# Patient Record
Sex: Male | Born: 1960
Health system: Southern US, Community
[De-identification: ages and names within clinical notes are randomized; demographics above are authoritative.]

## PROBLEM LIST (undated history)

## (undated) DIAGNOSIS — G4733 Obstructive sleep apnea (adult) (pediatric): Secondary | ICD-10-CM

## (undated) DIAGNOSIS — Z952 Presence of prosthetic heart valve: Secondary | ICD-10-CM

## (undated) DIAGNOSIS — Z954 Presence of other heart-valve replacement: Secondary | ICD-10-CM

## (undated) DIAGNOSIS — I5033 Acute on chronic diastolic (congestive) heart failure: Secondary | ICD-10-CM

## (undated) DIAGNOSIS — I4891 Unspecified atrial fibrillation: Secondary | ICD-10-CM

## (undated) DIAGNOSIS — A0472 Enterocolitis due to Clostridium difficile, not specified as recurrent: Secondary | ICD-10-CM

## (undated) DIAGNOSIS — I872 Venous insufficiency (chronic) (peripheral): Secondary | ICD-10-CM

## (undated) DIAGNOSIS — G459 Transient cerebral ischemic attack, unspecified: Secondary | ICD-10-CM

## (undated) DIAGNOSIS — I10 Essential (primary) hypertension: Secondary | ICD-10-CM

## (undated) DIAGNOSIS — I639 Cerebral infarction, unspecified: Secondary | ICD-10-CM

## (undated) DIAGNOSIS — E291 Testicular hypofunction: Secondary | ICD-10-CM

## (undated) DIAGNOSIS — G9340 Encephalopathy, unspecified: Secondary | ICD-10-CM

## (undated) DIAGNOSIS — D649 Anemia, unspecified: Secondary | ICD-10-CM

## (undated) DIAGNOSIS — E119 Type 2 diabetes mellitus without complications: Secondary | ICD-10-CM

## (undated) DIAGNOSIS — I5032 Chronic diastolic (congestive) heart failure: Secondary | ICD-10-CM

## (undated) DIAGNOSIS — I8310 Varicose veins of unspecified lower extremity with inflammation: Secondary | ICD-10-CM

## (undated) DIAGNOSIS — Z86010 Personal history of colonic polyps: Secondary | ICD-10-CM

## (undated) DIAGNOSIS — I6350 Cerebral infarction due to unspecified occlusion or stenosis of unspecified cerebral artery: Secondary | ICD-10-CM

## (undated) DIAGNOSIS — K219 Gastro-esophageal reflux disease without esophagitis: Secondary | ICD-10-CM

## (undated) DIAGNOSIS — J80 Acute respiratory distress syndrome: Secondary | ICD-10-CM

## (undated) DIAGNOSIS — I8390 Asymptomatic varicose veins of unspecified lower extremity: Secondary | ICD-10-CM

## (undated) DIAGNOSIS — L0390 Cellulitis, unspecified: Secondary | ICD-10-CM

## (undated) DIAGNOSIS — E785 Hyperlipidemia, unspecified: Secondary | ICD-10-CM

## (undated) DIAGNOSIS — K449 Diaphragmatic hernia without obstruction or gangrene: Secondary | ICD-10-CM

## (undated) DIAGNOSIS — I739 Peripheral vascular disease, unspecified: Secondary | ICD-10-CM

## (undated) DIAGNOSIS — K5790 Diverticulosis of intestine, part unspecified, without perforation or abscess without bleeding: Secondary | ICD-10-CM

## (undated) DIAGNOSIS — I2510 Atherosclerotic heart disease of native coronary artery without angina pectoris: Secondary | ICD-10-CM

## (undated) DIAGNOSIS — N529 Male erectile dysfunction, unspecified: Secondary | ICD-10-CM

## (undated) DIAGNOSIS — I340 Nonrheumatic mitral (valve) insufficiency: Secondary | ICD-10-CM

## (undated) DIAGNOSIS — M545 Low back pain: Secondary | ICD-10-CM

## (undated) DIAGNOSIS — G510 Bell's palsy: Secondary | ICD-10-CM

## (undated) DIAGNOSIS — I87309 Chronic venous hypertension (idiopathic) without complications of unspecified lower extremity: Secondary | ICD-10-CM

## (undated) DIAGNOSIS — G8929 Other chronic pain: Secondary | ICD-10-CM

## (undated) DIAGNOSIS — R7302 Impaired glucose tolerance (oral): Secondary | ICD-10-CM

## (undated) DIAGNOSIS — D126 Benign neoplasm of colon, unspecified: Secondary | ICD-10-CM

## (undated) DIAGNOSIS — C6290 Malignant neoplasm of unspecified testis, unspecified whether descended or undescended: Secondary | ICD-10-CM

## (undated) DIAGNOSIS — I219 Acute myocardial infarction, unspecified: Secondary | ICD-10-CM

## (undated) DIAGNOSIS — F419 Anxiety disorder, unspecified: Secondary | ICD-10-CM

## (undated) DIAGNOSIS — I7101 Dissection of thoracic aorta: Secondary | ICD-10-CM

## (undated) DIAGNOSIS — K317 Polyp of stomach and duodenum: Secondary | ICD-10-CM

## (undated) DIAGNOSIS — M1990 Unspecified osteoarthritis, unspecified site: Secondary | ICD-10-CM

## (undated) HISTORY — DX: Presence of prosthetic heart valve: Z95.2

## (undated) HISTORY — PX: ORCHIECTOMY: SHX2116

## (undated) HISTORY — DX: Cellulitis, unspecified: L03.90

## (undated) HISTORY — DX: Type 2 diabetes mellitus without complications: E11.9

## (undated) HISTORY — DX: Cerebral infarction due to unspecified occlusion or stenosis of unspecified cerebral artery: I63.50

## (undated) HISTORY — DX: Acute respiratory distress syndrome: J80

## (undated) HISTORY — DX: Nonrheumatic mitral (valve) insufficiency: I34.0

## (undated) HISTORY — DX: Hyperlipidemia, unspecified: E78.5

## (undated) HISTORY — DX: Morbid (severe) obesity due to excess calories: E66.01

## (undated) HISTORY — DX: Atherosclerotic heart disease of native coronary artery without angina pectoris: I25.10

## (undated) HISTORY — DX: Anemia, unspecified: D64.9

## (undated) HISTORY — PX: OTOPLASTY: SUR998

## (undated) HISTORY — PX: CARDIAC VALVE REPLACEMENT: SHX585

## (undated) HISTORY — DX: Benign neoplasm of colon, unspecified: D12.6

## (undated) HISTORY — DX: Chronic diastolic (congestive) heart failure: I50.32

## (undated) HISTORY — DX: Acute myocardial infarction, unspecified: I21.9

## (undated) HISTORY — DX: Presence of other heart-valve replacement: Z95.4

## (undated) HISTORY — DX: Personal history of colonic polyps: Z86.010

## (undated) HISTORY — DX: Essential (primary) hypertension: I10

## (undated) HISTORY — DX: Unspecified atrial fibrillation: I48.91

## (undated) HISTORY — DX: Impaired glucose tolerance (oral): R73.02

## (undated) HISTORY — DX: Varicose veins of unspecified lower extremity with inflammation: I83.10

## (undated) HISTORY — PX: OTHER SURGICAL HISTORY: SHX169

## (undated) HISTORY — DX: Chronic venous hypertension (idiopathic) without complications of unspecified lower extremity: I87.309

## (undated) HISTORY — DX: Testicular hypofunction: E29.1

## (undated) HISTORY — DX: Acute on chronic diastolic (congestive) heart failure: I50.33

## (undated) HISTORY — DX: Unspecified osteoarthritis, unspecified site: M19.90

## (undated) HISTORY — DX: Dissection of thoracic aorta: I71.01

## (undated) HISTORY — DX: Venous insufficiency (chronic) (peripheral): I87.2

## (undated) HISTORY — DX: Malignant neoplasm of unspecified testis, unspecified whether descended or undescended: C62.90

## (undated) HISTORY — DX: Male erectile dysfunction, unspecified: N52.9

## (undated) HISTORY — DX: Diverticulosis of intestine, part unspecified, without perforation or abscess without bleeding: K57.90

## (undated) HISTORY — DX: Cerebral infarction, unspecified: I63.9

## (undated) HISTORY — DX: Asymptomatic varicose veins of unspecified lower extremity: I83.90

## (undated) HISTORY — DX: Obstructive sleep apnea (adult) (pediatric): G47.33

## (undated) HISTORY — DX: Transient cerebral ischemic attack, unspecified: G45.9

## (undated) HISTORY — DX: Low back pain: M54.5

## (undated) HISTORY — DX: Peripheral vascular disease, unspecified: I73.9

## (undated) HISTORY — DX: Polyp of stomach and duodenum: K31.7

## (undated) HISTORY — DX: Diaphragmatic hernia without obstruction or gangrene: K44.9

## (undated) HISTORY — DX: Enterocolitis due to Clostridium difficile, not specified as recurrent: A04.72

## (undated) HISTORY — DX: Encephalopathy, unspecified: G93.40

## (undated) HISTORY — PX: CORONARY ARTERY BYPASS GRAFT: SHX141

## (undated) HISTORY — DX: Bell's palsy: G51.0

## (undated) HISTORY — DX: Other chronic pain: G89.29

---
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
## 1986-09-01 HISTORY — PX: BENTALL PROCEDURE: SHX5058

## 2005-02-03 DIAGNOSIS — I872 Venous insufficiency (chronic) (peripheral): Secondary | ICD-10-CM

## 2005-02-03 DIAGNOSIS — I8310 Varicose veins of unspecified lower extremity with inflammation: Secondary | ICD-10-CM

## 2005-02-03 HISTORY — DX: Varicose veins of unspecified lower extremity with inflammation: I83.10

## 2005-02-03 HISTORY — DX: Venous insufficiency (chronic) (peripheral): I87.2

## 2007-02-28 ENCOUNTER — Emergency Department (HOSPITAL_COMMUNITY): Admission: EM | Admit: 2007-02-28 | Discharge: 2007-02-28 | Payer: Self-pay | Admitting: Orthopedic Surgery

## 2008-07-14 ENCOUNTER — Encounter: Admission: RE | Admit: 2008-07-14 | Discharge: 2008-07-14 | Payer: Self-pay | Admitting: Cardiology

## 2008-07-14 DIAGNOSIS — I7101 Dissection of ascending aorta: Secondary | ICD-10-CM

## 2008-07-14 HISTORY — DX: Dissection of ascending aorta: I71.010

## 2008-07-14 HISTORY — DX: Dissection of thoracic aorta: I71.01

## 2009-07-05 ENCOUNTER — Emergency Department (HOSPITAL_COMMUNITY): Admission: EM | Admit: 2009-07-05 | Discharge: 2009-07-05 | Payer: Self-pay | Admitting: Emergency Medicine

## 2009-09-01 HISTORY — PX: CHOLECYSTECTOMY: SHX55

## 2009-10-01 ENCOUNTER — Emergency Department (HOSPITAL_COMMUNITY): Admission: EM | Admit: 2009-10-01 | Discharge: 2009-10-01 | Payer: Self-pay | Admitting: Emergency Medicine

## 2009-10-19 ENCOUNTER — Inpatient Hospital Stay (HOSPITAL_COMMUNITY): Admission: EM | Admit: 2009-10-19 | Discharge: 2009-10-22 | Payer: Self-pay | Admitting: Emergency Medicine

## 2009-10-19 ENCOUNTER — Ambulatory Visit: Payer: Self-pay | Admitting: Cardiology

## 2009-10-22 ENCOUNTER — Encounter: Payer: Self-pay | Admitting: Cardiology

## 2009-10-27 ENCOUNTER — Emergency Department (HOSPITAL_COMMUNITY): Admission: EM | Admit: 2009-10-27 | Discharge: 2009-10-27 | Payer: Self-pay | Admitting: Family Medicine

## 2009-10-28 ENCOUNTER — Ambulatory Visit: Payer: Self-pay | Admitting: Internal Medicine

## 2009-10-28 ENCOUNTER — Inpatient Hospital Stay (HOSPITAL_COMMUNITY): Admission: EM | Admit: 2009-10-28 | Discharge: 2009-10-31 | Payer: Self-pay | Admitting: Emergency Medicine

## 2009-10-29 ENCOUNTER — Encounter: Payer: Self-pay | Admitting: Cardiology

## 2009-10-30 ENCOUNTER — Encounter: Payer: Self-pay | Admitting: Cardiology

## 2009-10-31 ENCOUNTER — Encounter: Payer: Self-pay | Admitting: Cardiology

## 2009-11-01 ENCOUNTER — Ambulatory Visit: Payer: Self-pay | Admitting: Internal Medicine

## 2009-11-01 DIAGNOSIS — I8390 Asymptomatic varicose veins of unspecified lower extremity: Secondary | ICD-10-CM

## 2009-11-01 DIAGNOSIS — M109 Gout, unspecified: Secondary | ICD-10-CM

## 2009-11-01 DIAGNOSIS — I4891 Unspecified atrial fibrillation: Secondary | ICD-10-CM | POA: Insufficient documentation

## 2009-11-01 DIAGNOSIS — R609 Edema, unspecified: Secondary | ICD-10-CM

## 2009-11-01 DIAGNOSIS — Z954 Presence of other heart-valve replacement: Secondary | ICD-10-CM | POA: Insufficient documentation

## 2009-11-01 DIAGNOSIS — K648 Other hemorrhoids: Secondary | ICD-10-CM | POA: Insufficient documentation

## 2009-11-01 DIAGNOSIS — E119 Type 2 diabetes mellitus without complications: Secondary | ICD-10-CM

## 2009-11-01 DIAGNOSIS — D509 Iron deficiency anemia, unspecified: Secondary | ICD-10-CM

## 2009-11-01 DIAGNOSIS — I252 Old myocardial infarction: Secondary | ICD-10-CM

## 2009-11-01 HISTORY — DX: Type 2 diabetes mellitus without complications: E11.9

## 2009-11-02 ENCOUNTER — Encounter (INDEPENDENT_AMBULATORY_CARE_PROVIDER_SITE_OTHER): Payer: Self-pay | Admitting: Internal Medicine

## 2009-11-04 ENCOUNTER — Telehealth (INDEPENDENT_AMBULATORY_CARE_PROVIDER_SITE_OTHER): Payer: Self-pay | Admitting: Internal Medicine

## 2009-11-05 LAB — CONVERTED CEMR LAB: Uric Acid, Serum: 12.1 mg/dL — ABNORMAL HIGH (ref 4.0–7.8)

## 2009-11-09 ENCOUNTER — Ambulatory Visit: Payer: Self-pay | Admitting: Internal Medicine

## 2009-11-11 ENCOUNTER — Inpatient Hospital Stay (HOSPITAL_COMMUNITY): Admission: EM | Admit: 2009-11-11 | Discharge: 2009-11-17 | Payer: Self-pay | Admitting: Emergency Medicine

## 2009-11-11 ENCOUNTER — Encounter (INDEPENDENT_AMBULATORY_CARE_PROVIDER_SITE_OTHER): Payer: Self-pay | Admitting: Dermatology

## 2009-11-11 ENCOUNTER — Encounter: Payer: Self-pay | Admitting: Cardiology

## 2009-11-11 ENCOUNTER — Ambulatory Visit: Payer: Self-pay | Admitting: Internal Medicine

## 2009-11-13 ENCOUNTER — Encounter (INDEPENDENT_AMBULATORY_CARE_PROVIDER_SITE_OTHER): Payer: Self-pay | Admitting: Internal Medicine

## 2009-11-14 ENCOUNTER — Encounter: Payer: Self-pay | Admitting: Internal Medicine

## 2009-11-14 LAB — CONVERTED CEMR LAB
INR: 2.11 — ABNORMAL HIGH (ref <1.50)
Prothrombin Time: 23.5 s — ABNORMAL HIGH (ref 11.6–15.2)

## 2009-11-17 ENCOUNTER — Encounter: Payer: Self-pay | Admitting: Internal Medicine

## 2009-11-20 ENCOUNTER — Ambulatory Visit: Payer: Self-pay | Admitting: Internal Medicine

## 2009-11-24 LAB — CONVERTED CEMR LAB
INR: 1.38 (ref <1.50)
Prothrombin Time: 16.8 s — ABNORMAL HIGH (ref 11.6–15.2)

## 2009-11-26 ENCOUNTER — Ambulatory Visit: Payer: Self-pay | Admitting: Internal Medicine

## 2009-11-27 LAB — CONVERTED CEMR LAB: INR: 1.59 — ABNORMAL HIGH (ref <1.50)

## 2009-11-29 ENCOUNTER — Ambulatory Visit: Payer: Self-pay | Admitting: Internal Medicine

## 2009-11-30 ENCOUNTER — Ambulatory Visit: Payer: Self-pay | Admitting: Cardiology

## 2009-11-30 LAB — CONVERTED CEMR LAB: Chloride: 96 meq/L (ref 96–112)

## 2009-12-04 ENCOUNTER — Ambulatory Visit: Payer: Self-pay | Admitting: Vascular Surgery

## 2009-12-04 ENCOUNTER — Encounter (INDEPENDENT_AMBULATORY_CARE_PROVIDER_SITE_OTHER): Payer: Self-pay | Admitting: Internal Medicine

## 2009-12-04 ENCOUNTER — Ambulatory Visit: Admission: RE | Admit: 2009-12-04 | Discharge: 2009-12-04 | Payer: Self-pay | Admitting: Internal Medicine

## 2009-12-05 ENCOUNTER — Telehealth (INDEPENDENT_AMBULATORY_CARE_PROVIDER_SITE_OTHER): Payer: Self-pay | Admitting: Internal Medicine

## 2009-12-06 ENCOUNTER — Ambulatory Visit: Payer: Self-pay | Admitting: Internal Medicine

## 2009-12-07 ENCOUNTER — Telehealth (INDEPENDENT_AMBULATORY_CARE_PROVIDER_SITE_OTHER): Payer: Self-pay | Admitting: Internal Medicine

## 2009-12-07 LAB — CONVERTED CEMR LAB
INR: 3.38 — ABNORMAL HIGH (ref <1.50)
Prothrombin Time: 33.9 s — ABNORMAL HIGH (ref 11.6–15.2)

## 2009-12-10 ENCOUNTER — Telehealth (INDEPENDENT_AMBULATORY_CARE_PROVIDER_SITE_OTHER): Payer: Self-pay | Admitting: Internal Medicine

## 2009-12-14 ENCOUNTER — Telehealth (INDEPENDENT_AMBULATORY_CARE_PROVIDER_SITE_OTHER): Payer: Self-pay | Admitting: Internal Medicine

## 2009-12-14 ENCOUNTER — Ambulatory Visit: Payer: Self-pay | Admitting: Internal Medicine

## 2009-12-14 DIAGNOSIS — I10 Essential (primary) hypertension: Secondary | ICD-10-CM

## 2009-12-15 ENCOUNTER — Telehealth (INDEPENDENT_AMBULATORY_CARE_PROVIDER_SITE_OTHER): Payer: Self-pay | Admitting: Internal Medicine

## 2009-12-15 LAB — CONVERTED CEMR LAB
INR: 6.83 (ref <1.50)
Prothrombin Time: 58.8 s — ABNORMAL HIGH (ref 11.6–15.2)

## 2009-12-17 ENCOUNTER — Ambulatory Visit: Payer: Self-pay | Admitting: Internal Medicine

## 2009-12-17 ENCOUNTER — Encounter: Payer: Self-pay | Admitting: Physician Assistant

## 2009-12-18 LAB — CONVERTED CEMR LAB: Prothrombin Time: 36.7 s — ABNORMAL HIGH (ref 11.6–15.2)

## 2009-12-19 ENCOUNTER — Telehealth (INDEPENDENT_AMBULATORY_CARE_PROVIDER_SITE_OTHER): Payer: Self-pay | Admitting: Internal Medicine

## 2009-12-24 ENCOUNTER — Ambulatory Visit: Payer: Self-pay | Admitting: Internal Medicine

## 2009-12-25 ENCOUNTER — Encounter (INDEPENDENT_AMBULATORY_CARE_PROVIDER_SITE_OTHER): Payer: Self-pay | Admitting: Internal Medicine

## 2009-12-26 LAB — CONVERTED CEMR LAB: INR: 1.69 — ABNORMAL HIGH (ref <1.50)

## 2010-01-02 ENCOUNTER — Ambulatory Visit: Payer: Self-pay | Admitting: Internal Medicine

## 2010-01-09 LAB — CONVERTED CEMR LAB: INR: 3.11 — ABNORMAL HIGH (ref <1.50)

## 2010-01-15 ENCOUNTER — Ambulatory Visit: Payer: Self-pay | Admitting: Internal Medicine

## 2010-01-15 DIAGNOSIS — L97909 Non-pressure chronic ulcer of unspecified part of unspecified lower leg with unspecified severity: Secondary | ICD-10-CM

## 2010-01-15 DIAGNOSIS — I87309 Chronic venous hypertension (idiopathic) without complications of unspecified lower extremity: Secondary | ICD-10-CM | POA: Insufficient documentation

## 2010-01-15 DIAGNOSIS — E785 Hyperlipidemia, unspecified: Secondary | ICD-10-CM | POA: Insufficient documentation

## 2010-01-15 DIAGNOSIS — I87319 Chronic venous hypertension (idiopathic) with ulcer of unspecified lower extremity: Secondary | ICD-10-CM

## 2010-01-16 ENCOUNTER — Encounter (INDEPENDENT_AMBULATORY_CARE_PROVIDER_SITE_OTHER): Payer: Self-pay | Admitting: Internal Medicine

## 2010-01-22 ENCOUNTER — Telehealth (INDEPENDENT_AMBULATORY_CARE_PROVIDER_SITE_OTHER): Payer: Self-pay | Admitting: Internal Medicine

## 2010-01-25 ENCOUNTER — Ambulatory Visit: Payer: Self-pay | Admitting: Internal Medicine

## 2010-01-25 DIAGNOSIS — L02419 Cutaneous abscess of limb, unspecified: Secondary | ICD-10-CM | POA: Insufficient documentation

## 2010-01-25 DIAGNOSIS — L03119 Cellulitis of unspecified part of limb: Secondary | ICD-10-CM

## 2010-01-25 LAB — CONVERTED CEMR LAB
BUN: 18 mg/dL (ref 6–23)
CO2: 28 meq/L (ref 19–32)
Cholesterol: 140 mg/dL (ref 0–200)
Glucose, Bld: 75 mg/dL (ref 70–99)
HDL: 28 mg/dL — ABNORMAL LOW (ref >39)
LDL Cholesterol: 60 mg/dL (ref 0–99)
Potassium: 4.2 meq/L (ref 3.5–5.3)
Prothrombin Time: 34.6 s — ABNORMAL HIGH (ref 11.6–15.2)
Sodium: 137 meq/L (ref 135–145)
VLDL: 52 mg/dL — ABNORMAL HIGH (ref 0–40)

## 2010-02-06 ENCOUNTER — Ambulatory Visit: Payer: Self-pay | Admitting: Vascular Surgery

## 2010-02-06 ENCOUNTER — Encounter (INDEPENDENT_AMBULATORY_CARE_PROVIDER_SITE_OTHER): Payer: Self-pay | Admitting: Internal Medicine

## 2010-02-07 ENCOUNTER — Ambulatory Visit: Payer: Self-pay | Admitting: Internal Medicine

## 2010-02-07 ENCOUNTER — Telehealth (INDEPENDENT_AMBULATORY_CARE_PROVIDER_SITE_OTHER): Payer: Self-pay | Admitting: Internal Medicine

## 2010-02-13 ENCOUNTER — Encounter: Admission: RE | Admit: 2010-02-13 | Discharge: 2010-02-13 | Payer: Self-pay | Admitting: Internal Medicine

## 2010-02-13 ENCOUNTER — Telehealth (INDEPENDENT_AMBULATORY_CARE_PROVIDER_SITE_OTHER): Payer: Self-pay | Admitting: *Deleted

## 2010-02-21 ENCOUNTER — Encounter (INDEPENDENT_AMBULATORY_CARE_PROVIDER_SITE_OTHER): Payer: Self-pay | Admitting: Internal Medicine

## 2010-02-21 LAB — CONVERTED CEMR LAB: INR: 3.17 — ABNORMAL HIGH (ref <1.50)

## 2010-02-26 ENCOUNTER — Encounter (HOSPITAL_BASED_OUTPATIENT_CLINIC_OR_DEPARTMENT_OTHER): Admission: RE | Admit: 2010-02-26 | Discharge: 2010-05-27 | Payer: Self-pay | Admitting: General Surgery

## 2010-02-27 ENCOUNTER — Telehealth (INDEPENDENT_AMBULATORY_CARE_PROVIDER_SITE_OTHER): Payer: Self-pay | Admitting: Internal Medicine

## 2010-03-06 ENCOUNTER — Ambulatory Visit: Payer: Self-pay | Admitting: Vascular Surgery

## 2010-03-06 HISTORY — PX: LASER ABLATION: SHX1947

## 2010-03-13 ENCOUNTER — Ambulatory Visit: Payer: Self-pay | Admitting: Vascular Surgery

## 2010-03-15 ENCOUNTER — Ambulatory Visit: Payer: Self-pay | Admitting: Internal Medicine

## 2010-04-02 ENCOUNTER — Ambulatory Visit: Payer: Self-pay | Admitting: Internal Medicine

## 2010-04-02 ENCOUNTER — Emergency Department (HOSPITAL_COMMUNITY): Admission: EM | Admit: 2010-04-02 | Discharge: 2010-04-02 | Payer: Self-pay | Admitting: Family Medicine

## 2010-04-02 ENCOUNTER — Telehealth (INDEPENDENT_AMBULATORY_CARE_PROVIDER_SITE_OTHER): Payer: Self-pay | Admitting: Internal Medicine

## 2010-04-02 DIAGNOSIS — T169XXA Foreign body in ear, unspecified ear, initial encounter: Secondary | ICD-10-CM | POA: Insufficient documentation

## 2010-04-15 ENCOUNTER — Ambulatory Visit: Payer: Self-pay | Admitting: Internal Medicine

## 2010-04-18 LAB — CONVERTED CEMR LAB
INR: 3.16 — ABNORMAL HIGH (ref <1.50)
Prothrombin Time: 32.5 s — ABNORMAL HIGH (ref 11.6–15.2)

## 2010-04-29 ENCOUNTER — Ambulatory Visit: Payer: Self-pay | Admitting: Internal Medicine

## 2010-04-29 DIAGNOSIS — M479 Spondylosis, unspecified: Secondary | ICD-10-CM | POA: Insufficient documentation

## 2010-04-29 DIAGNOSIS — L97909 Non-pressure chronic ulcer of unspecified part of unspecified lower leg with unspecified severity: Secondary | ICD-10-CM | POA: Insufficient documentation

## 2010-04-29 DIAGNOSIS — G4730 Sleep apnea, unspecified: Secondary | ICD-10-CM | POA: Insufficient documentation

## 2010-04-29 LAB — CONVERTED CEMR LAB: Blood Glucose, Fingerstick: 149

## 2010-05-15 ENCOUNTER — Ambulatory Visit: Payer: Self-pay | Admitting: Internal Medicine

## 2010-05-16 ENCOUNTER — Encounter (HOSPITAL_COMMUNITY)
Admission: RE | Admit: 2010-05-16 | Discharge: 2010-05-31 | Payer: Self-pay | Source: Home / Self Care | Admitting: Cardiology

## 2010-05-17 ENCOUNTER — Telehealth (INDEPENDENT_AMBULATORY_CARE_PROVIDER_SITE_OTHER): Payer: Self-pay | Admitting: Internal Medicine

## 2010-05-17 ENCOUNTER — Encounter: Payer: Self-pay | Admitting: Cardiology

## 2010-05-20 ENCOUNTER — Telehealth (INDEPENDENT_AMBULATORY_CARE_PROVIDER_SITE_OTHER): Payer: Self-pay | Admitting: Internal Medicine

## 2010-05-20 ENCOUNTER — Ambulatory Visit (HOSPITAL_COMMUNITY): Admission: RE | Admit: 2010-05-20 | Discharge: 2010-05-20 | Payer: Self-pay | Admitting: Internal Medicine

## 2010-05-20 ENCOUNTER — Encounter: Payer: Self-pay | Admitting: Cardiology

## 2010-05-24 ENCOUNTER — Ambulatory Visit: Payer: Self-pay | Admitting: Internal Medicine

## 2010-05-30 LAB — CONVERTED CEMR LAB: INR: 1.93 — ABNORMAL HIGH (ref <1.50)

## 2010-06-01 ENCOUNTER — Encounter (HOSPITAL_COMMUNITY)
Admission: RE | Admit: 2010-06-01 | Discharge: 2010-08-09 | Payer: Self-pay | Source: Home / Self Care | Attending: Cardiology | Admitting: Cardiology

## 2010-06-04 ENCOUNTER — Ambulatory Visit: Payer: Self-pay | Admitting: Internal Medicine

## 2010-06-07 ENCOUNTER — Encounter: Payer: Self-pay | Admitting: Cardiology

## 2010-06-11 LAB — CONVERTED CEMR LAB: Prothrombin Time: 33.3 s — ABNORMAL HIGH (ref 11.6–15.2)

## 2010-06-12 ENCOUNTER — Encounter (INDEPENDENT_AMBULATORY_CARE_PROVIDER_SITE_OTHER): Payer: Self-pay | Admitting: Internal Medicine

## 2010-06-15 ENCOUNTER — Emergency Department (HOSPITAL_COMMUNITY): Admission: EM | Admit: 2010-06-15 | Discharge: 2010-06-15 | Payer: Self-pay | Admitting: Family Medicine

## 2010-06-21 ENCOUNTER — Telehealth (INDEPENDENT_AMBULATORY_CARE_PROVIDER_SITE_OTHER): Payer: Self-pay | Admitting: Internal Medicine

## 2010-06-25 ENCOUNTER — Ambulatory Visit: Payer: Self-pay | Admitting: Vascular Surgery

## 2010-06-26 ENCOUNTER — Encounter (INDEPENDENT_AMBULATORY_CARE_PROVIDER_SITE_OTHER): Payer: Self-pay | Admitting: Internal Medicine

## 2010-06-27 ENCOUNTER — Ambulatory Visit: Payer: Self-pay | Admitting: Internal Medicine

## 2010-07-04 ENCOUNTER — Encounter (INDEPENDENT_AMBULATORY_CARE_PROVIDER_SITE_OTHER): Payer: Self-pay | Admitting: Internal Medicine

## 2010-07-16 ENCOUNTER — Ambulatory Visit: Payer: Self-pay | Admitting: Internal Medicine

## 2010-07-18 ENCOUNTER — Encounter: Payer: Self-pay | Admitting: Cardiology

## 2010-07-19 LAB — CONVERTED CEMR LAB: Prothrombin Time: 35.3 s — ABNORMAL HIGH (ref 11.6–15.2)

## 2010-07-23 ENCOUNTER — Ambulatory Visit: Payer: Self-pay | Admitting: Internal Medicine

## 2010-07-23 DIAGNOSIS — Z8547 Personal history of malignant neoplasm of testis: Secondary | ICD-10-CM | POA: Insufficient documentation

## 2010-07-23 LAB — CONVERTED CEMR LAB
Basophils Relative: 1 % (ref 0–1)
Eosinophils Absolute: 0.1 10*3/uL (ref 0.0–0.7)
Eosinophils Relative: 2 % (ref 0–5)
HCT: 35.9 % — ABNORMAL LOW (ref 39.0–52.0)
Hemoglobin: 10.8 g/dL — ABNORMAL LOW (ref 13.0–17.0)
Lymphs Abs: 1.5 10*3/uL (ref 0.7–4.0)
MCHC: 30.1 g/dL (ref 30.0–36.0)
MCV: 83.5 fL (ref 78.0–100.0)
Monocytes Absolute: 0.8 10*3/uL (ref 0.1–1.0)
Monocytes Relative: 9 % (ref 3–12)
RBC: 4.3 M/uL (ref 4.22–5.81)
WBC: 8.2 10*3/uL (ref 4.0–10.5)

## 2010-07-30 ENCOUNTER — Ambulatory Visit: Payer: Self-pay | Admitting: Internal Medicine

## 2010-07-30 LAB — CONVERTED CEMR LAB
INR: 2.74 — ABNORMAL HIGH (ref <1.50)
Prothrombin Time: 29.1 s — ABNORMAL HIGH (ref 11.6–15.2)

## 2010-08-02 ENCOUNTER — Telehealth (INDEPENDENT_AMBULATORY_CARE_PROVIDER_SITE_OTHER): Payer: Self-pay | Admitting: Internal Medicine

## 2010-08-15 ENCOUNTER — Telehealth (INDEPENDENT_AMBULATORY_CARE_PROVIDER_SITE_OTHER): Payer: Self-pay | Admitting: Internal Medicine

## 2010-08-19 ENCOUNTER — Encounter
Admission: RE | Admit: 2010-08-19 | Discharge: 2010-10-01 | Payer: Self-pay | Source: Home / Self Care | Attending: Internal Medicine | Admitting: Internal Medicine

## 2010-08-21 ENCOUNTER — Telehealth (INDEPENDENT_AMBULATORY_CARE_PROVIDER_SITE_OTHER): Payer: Self-pay | Admitting: Internal Medicine

## 2010-08-21 ENCOUNTER — Encounter (INDEPENDENT_AMBULATORY_CARE_PROVIDER_SITE_OTHER): Payer: Self-pay | Admitting: Internal Medicine

## 2010-08-25 ENCOUNTER — Telehealth (INDEPENDENT_AMBULATORY_CARE_PROVIDER_SITE_OTHER): Payer: Self-pay | Admitting: Internal Medicine

## 2010-08-27 ENCOUNTER — Telehealth (INDEPENDENT_AMBULATORY_CARE_PROVIDER_SITE_OTHER): Payer: Self-pay | Admitting: Internal Medicine

## 2010-09-05 ENCOUNTER — Encounter (INDEPENDENT_AMBULATORY_CARE_PROVIDER_SITE_OTHER): Payer: Self-pay | Admitting: Internal Medicine

## 2010-09-10 ENCOUNTER — Encounter (INDEPENDENT_AMBULATORY_CARE_PROVIDER_SITE_OTHER): Payer: Self-pay | Admitting: Internal Medicine

## 2010-09-12 ENCOUNTER — Telehealth (INDEPENDENT_AMBULATORY_CARE_PROVIDER_SITE_OTHER): Payer: Self-pay | Admitting: Internal Medicine

## 2010-09-12 ENCOUNTER — Encounter (INDEPENDENT_AMBULATORY_CARE_PROVIDER_SITE_OTHER): Payer: Self-pay | Admitting: Internal Medicine

## 2010-09-16 ENCOUNTER — Ambulatory Visit: Admit: 2010-09-16 | Payer: Self-pay | Admitting: Internal Medicine

## 2010-09-17 ENCOUNTER — Encounter (INDEPENDENT_AMBULATORY_CARE_PROVIDER_SITE_OTHER): Payer: Self-pay | Admitting: Internal Medicine

## 2010-09-17 ENCOUNTER — Ambulatory Visit
Admission: RE | Admit: 2010-09-17 | Discharge: 2010-09-17 | Payer: Self-pay | Source: Home / Self Care | Attending: Internal Medicine | Admitting: Internal Medicine

## 2010-09-19 LAB — CONVERTED CEMR LAB: Prothrombin Time: 32.4 s — ABNORMAL HIGH (ref 11.6–15.2)

## 2010-09-20 ENCOUNTER — Encounter (INDEPENDENT_AMBULATORY_CARE_PROVIDER_SITE_OTHER): Payer: Self-pay | Admitting: Internal Medicine

## 2010-09-20 ENCOUNTER — Telehealth: Payer: Self-pay | Admitting: Cardiology

## 2010-09-22 ENCOUNTER — Encounter: Payer: Self-pay | Admitting: Cardiology

## 2010-09-23 ENCOUNTER — Telehealth (INDEPENDENT_AMBULATORY_CARE_PROVIDER_SITE_OTHER): Payer: Self-pay | Admitting: Internal Medicine

## 2010-09-23 ENCOUNTER — Ambulatory Visit
Admission: RE | Admit: 2010-09-23 | Discharge: 2010-09-23 | Payer: Self-pay | Source: Home / Self Care | Attending: Internal Medicine | Admitting: Internal Medicine

## 2010-09-23 ENCOUNTER — Encounter (INDEPENDENT_AMBULATORY_CARE_PROVIDER_SITE_OTHER): Payer: Self-pay | Admitting: Internal Medicine

## 2010-09-23 ENCOUNTER — Ambulatory Visit (HOSPITAL_COMMUNITY)
Admission: RE | Admit: 2010-09-23 | Discharge: 2010-09-23 | Payer: Self-pay | Source: Home / Self Care | Attending: Internal Medicine | Admitting: Internal Medicine

## 2010-09-23 LAB — CONVERTED CEMR LAB
Barbiturate Quant, Ur: NEGATIVE
Creatinine,U: 114.1 mg/dL
Marijuana Metabolite: NEGATIVE
Opiate Screen, Urine: NEGATIVE
Phencyclidine (PCP): NEGATIVE
Propoxyphene: NEGATIVE

## 2010-09-25 ENCOUNTER — Encounter (INDEPENDENT_AMBULATORY_CARE_PROVIDER_SITE_OTHER): Payer: Self-pay | Admitting: Nurse Practitioner

## 2010-09-25 ENCOUNTER — Ambulatory Visit (HOSPITAL_COMMUNITY): Admission: RE | Admit: 2010-09-25 | Payer: Self-pay | Source: Home / Self Care | Admitting: Internal Medicine

## 2010-09-25 LAB — BUN: BUN: 17 mg/dL (ref 6–23)

## 2010-09-25 LAB — CREATININE, SERUM
Creatinine, Ser: 0.92 mg/dL (ref 0.4–1.5)
GFR calc Af Amer: >60 mL/min (ref >60)

## 2010-10-01 ENCOUNTER — Encounter (INDEPENDENT_AMBULATORY_CARE_PROVIDER_SITE_OTHER): Payer: Self-pay | Admitting: Internal Medicine

## 2010-10-01 ENCOUNTER — Telehealth (INDEPENDENT_AMBULATORY_CARE_PROVIDER_SITE_OTHER): Payer: Self-pay | Admitting: Internal Medicine

## 2010-10-02 ENCOUNTER — Ambulatory Visit: Payer: Self-pay | Admitting: Physical Therapy

## 2010-10-02 ENCOUNTER — Encounter: Payer: Self-pay | Admitting: Internal Medicine

## 2010-10-02 NOTE — Letter (Signed)
Summary: [facility]  [facility]   Imported By: Arta Bruce 02/26/2010 12:34:21  _____________________________________________________________________  External Attachment:    Type:   Image     Comment:   External Document

## 2010-10-02 NOTE — Letter (Signed)
Summary: [facility]  [facility]   Imported By: Arta Bruce 08/01/2010 12:29:37  _____________________________________________________________________  External Attachment:    Type:   Image     Comment:   External Document

## 2010-10-02 NOTE — Progress Notes (Signed)
  Phone Note Call from Patient Call back at [facility] Phone [phone] Call back at [phone]   Summary of Call: The pt needs either Tiffany  or Dr Delrae Alfred to call him back because he has some pain in his left leg and his right leg has some smiliar thing and is hurting.  Pt had three weeks ago a surgery from the gall bladder. Puneet Masoner MD Initial call taken by: Manon Hilding,  December 10, 2009 12:25 PM  Follow-up for Phone Call        Left message on answering machine for pt to return call [phone] ........Marland Kitchen Chauncy Passy SMA    December 10, 2009 5:00 PM   Additional Follow-up for Phone Call Additional follow up Details #1::        Left message on answering machine for pt to return call at same number ........Marland Kitchen Chauncy Passy SMA December 12, 2009 2:37 PM     Additional Follow-up for Phone Call Additional follow up Details #2::    Pt. called and justed wanted to let us know that his right leg is having the same problems as his left. He states his vericose veins can be felt and are painful. On a scale of 1-10, 10 being the worst, he rates his right foot pain a 2 and his left foot between 3 and 9. Pt. is aware of his appt. this Friday and states he can hold out till than but just wanted to let us know what's going on.   .............Marland Kitchen Chauncy Passy SMA   December 12, 2009 3:27 PM   Pt being seen today.  Julieanne Manson MD  December 14, 2009 6:29 PM

## 2010-10-02 NOTE — Miscellaneous (Signed)
Summary: [facility] Order[facility]   [facility] Order[facility]   Imported By: Roderic Ovens 06/19/2010 15:46:13  _____________________________________________________________________  External Attachment:    Type:   Image     Comment:   External Document

## 2010-10-02 NOTE — Progress Notes (Signed)
  Phone Note Outgoing Call   Summary of Call: Called pt.--please put him on schedule for Protime tomorrow morning (Thursday). Initial call taken by: Julieanne Manson MD,  December 05, 2009 8:25 AM  Follow-up for Phone Call        SPOKE WITH PATIENT AND HE IS SCHEDULE TO COME IN ON THURSDAY MORNING AT 10:30 Follow-up by: Leodis Rains,  December 05, 2009 10:15 AM

## 2010-10-02 NOTE — Letter (Signed)
Summary[facility]   Imported By: Arta Bruce 02/26/2010 12:27:41  _____________________________________________________________________  External Attachment:    Type:   Image     Comment:   External Document

## 2010-10-02 NOTE — Progress Notes (Signed)
  Phone Note Outgoing Call   Summary of Call: Debra:  please see referral for Wound care. I apparently did not send to you when seen last week. Initial call taken by: Julieanne Manson MD,  February 07, 2010 10:14 AM

## 2010-10-02 NOTE — Letter (Signed)
[facility]   Imported By: Marylou Mccoy 08/05/2010 08:50:58  _____________________________________________________________________  External Attachment:    Type:   Image     Comment:   External Document

## 2010-10-02 NOTE — Assessment & Plan Note (Signed)
Summary: 1 MONTH FU ///KT   Vital Signs:  Patient profile:   50 year old male Weight:      236 pounds Temp:     98.2 degrees F Pulse rate:   84 / minute Pulse rhythm:   regular Resp:     18 per minute BP sitting:   118 / 78  (right arm) Cuff size:   large  Vitals Entered By: Vesta Mixer CMA (Jan 15, 2010 3:44 PM) CC: one month f/u and varicose vein opened up on rle and it is bothering him Is Patient Diabetic? No Pain Assessment Patient in pain? yes     Location: rle Intensity: 5  Does patient need assistance? Ambulation Normal   Primary Care Provider:  Julieanne Manson MD  CC:  one month f/u and varicose vein opened up on rle and it is bothering him.  History of Present Illness: 1.  Anticoagulation:  Tolerating Warfarin 7.5 mg daily.  Due for protime today.  2.  Varicosities:  One varicosity opened and was bleeding yesterday.  Appt. to see [facility] not until 02/06/10--missed initial appt.  Not bleeding much now.  Having a lot of pain from the leg currently.  3.  DM:  not monitoring sugars.  States he just forgets to take sugars.  Has lost weight, but has not attempted to lose.  Eating only one meal daily.  Discussed healthier eating.  Has a nutitrition appt. coming up.  4.  Leg cramps and abdominal musculature cramping.  Feels secondary to Lasix use.  Taking potassium gluconate --total of 3 tabs daily.  Last potassium on 4/1 was 4.1.  5.  Hypertension:  controlled.  6.  Not on a cholesterol lowering medication.  Has had elevated triglycerides in past and a low HDL.  Allergies (verified): 1)  ! Penicillin  Physical Exam  Lungs:  Normal respiratory effort, chest expands symmetrically. Lungs are clear to auscultation, no crackles or wheezes. Heart:  irreg, irreg Extremities:  1 cm venous ulcer--fairly shallow with coagulated blood.  Not actively bleeding.  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 with [facility] Consider wound clinic   Problem # 2:  ESSENTIAL HYPERTENSION (ICD-401.9) Controlled His updated medication list for this problem includes:    Diltiazem Hcl Er Beads 120 Mg Xr24h-cap (Diltiazem hcl er beads) .Marland Kitchen... 1 by mouth once daily    Furosemide 40 Mg Tabs (Furosemide) .Marland Kitchen... Take 1 tablet by mouth two times a day    Atenolol 100 Mg Tabs (Atenolol) .Marland Kitchen... 1 by mouth q am 1/2 q pm  Problem # 3:  MUSCLE CRAMPS (ICD-729.82) Check electrolytes Orders: T-Basic Metabolic Panel [id number])  Problem # 4:  DIABETES MELLITUS, TYPE II (ICD-250.00) Encouraged lifestyle changes--hopefully information from diabetic nutrition will help Orders: T-Lipid Profile ([id number])  Problem # 5:  DYSLIPIDEMIA (ICD-272.4) Check cholesterol  Problem # 6:  AORTIC VALVE REPLACEMENT, HX OF (ICD-V43.3) And Afib--check protime Orders: T-Protime, Auto ([id number])  Complete Medication List: 1)  K-99 595 Mg Caps (Potassium gluconate) .Marland Kitchen.. 1 by mouth two times a day 2)  Protonix 40 Mg Tbec (Pantoprazole sodium) .Marland Kitchen.. 1 by mouth once daily 3)  Ferrous Sulfate 325 (65 Fe) Mg Tabs (Ferrous sulfate) .Marland Kitchen.. 1 by mouth once daily 4)  Allopurinol 300 Mg Tabs (Allopurinol) .Marland Kitchen.. 1 by mouth once daily 5)  Lanoxin 0.25 Mg Tabs (Digoxin) .Marland Kitchen.. 1 by  mouth once daily 6)  Diltiazem Hcl Er Beads 120 Mg Xr24h-cap (Diltiazem hcl er beads) .Marland Kitchen.. 1 by mouth once daily 7)  Furosemide 40 Mg Tabs (Furosemide) .... Take 1 tablet by mouth two times a day 8)  Atenolol 100 Mg Tabs (Atenolol) .Marland Kitchen.. 1 by mouth q am 1/2 q pm 9)  Colcrys 0.6 Mg Tabs (Colchicine) .Marland Kitchen.. 1 tab by mouth as needed for gouty flair 10)  Warfarin Sodium 5 Mg Tabs (Warfarin sodium) .Marland Kitchen.. 1 1/2 tabs daily 11)  Calcium-magnesium-zinc 750-375-15 Mg Tabs (Calcium-magnesium-zinc) .Marland Kitchen.. 1 by mouth two times a day as needed 12)  Vitamin C 1000 Mg Tabs (Ascorbic acid) .Marland Kitchen..  1 by mouth once daily 13)  Vitamin E 400 Unit Caps (Vitamin e) .Marland Kitchen.. 1 by mouth once daily 14)  B Complex Tabs (B complex vitamins) .... Once daily 15)  Oxycodone-acetaminophen 5-325 Mg Tabs (Oxycodone-acetaminophen) .Marland Kitchen.. 1 tab by mouth every 6 hours as needed.  Patient Instructions: 1)  Follow up with Dr. Delrae Alfred in 3 months --DM, hypertension, varicosities

## 2010-10-02 NOTE — Progress Notes (Signed)
Summary: SHOIULD HE TAKE INSULIN  Phone Note Call from Patient Call back at Home Phone [phone]   Reason for Call: Talk to Nurse Summary of Call: MULBERRY PT . MR Peery CALLED AND WANTS TO KNOW IF HE SHOULD TAKE SOME INSULIN. WHEN HE WENT TO REHAB THIS MORNING HIS SUGAR LEVELS WERE 161, AND BEFORE LEAVING REHAB IT WAS 185, AND NOW ITS 145 AND HE'S GETTING READY TO EAT SOMEHTING AND WANTS TO KNOW SHOULD HE TAKE INSULIN. Initial call taken by: Leodis Rains,  May 20, 2010 11:50 AM  Follow-up for Phone Call        Pt. wants to know if he should start taking insulin as his CBGs are elevated.  States wife has insulin since she is diabetic and wanted to call to see if he should take some.  Advised not to take anything not prescribed specifically for him - sent to provider for f/u. Follow-up by: Dutch Quint RN,  May 20, 2010 12:24 PM  Additional Follow-up for Phone Call Additional follow up Details #1::        Have him come in for a fasting blood glucose and A1C in next week. Do not take wife's insulin Additional Follow-up by: Julieanne Manson MD,  May 23, 2010 6:39 PM    Additional Follow-up for Phone Call Additional follow up Details #2::    Lmom Michelle Nasuti  May 24, 2010 2:27 PM  pt aware Michelle Nasuti  May 24, 2010 2:42 PM

## 2010-10-02 NOTE — Progress Notes (Signed)
Summary: Q-TIP STUCK IN EAR  Phone Note Call from Patient Call back at Home Phone [phone]   Summary of Call: [facility] PT. MR Perl IS OUT IN THE LOBBY AND HE HAS A Q-TIP SUTCK IN HIS EAR Initial call taken by: Leodis Rains,  April 02, 2010 2:47 PM  Follow-up for Phone Call        Pt. triaged and seen by provider Wende Mott. Follow-up by: Dutch Quint RN,  April 02, 2010 3:32 PM

## 2010-10-02 NOTE — Letter (Signed)
Summary: REQUESTING RECORDS FROM [facility]  REQUESTING RECORDS FROM [facility]   Imported By: Arta Bruce 01/21/2010 11:15:28  _____________________________________________________________________  External Attachment:    Type:   Image     Comment:   External Document

## 2010-10-02 NOTE — Assessment & Plan Note (Signed)
Summary: 3 MONTH F/U///BC   Vital Signs:  Patient profile:   50 year old male Height:      60 inches Weight:      253 pounds BMI:     49.59 Temp:     96.9 degrees F oral Pulse rate:   68 / minute Pulse rhythm:   regular Resp:     18 per minute BP sitting:   142 / 82  (left arm)  Vitals Entered By: CMA Student CC: . follow-up diabeetes Is Patient Diabetic? Yes Pain Assessment Patient in pain? no      CBG Result 149  Does patient need assistance? Functional Status Self care Ambulation Normal   Primary Care Jasman Pfeifle:  Julieanne Manson MD  CC:  . follow-up diabeetes.  History of Present Illness: 1.  Venous Stasis of bilateral legs:  Continues to have difficulties--Dr. Early apparently does not feel there is anything more that can be done from venous standpoint.  Pt has been told to use compression, but pt. states he gets ulcers from that.  Pt. has not been back to wound clinic.  He states he is using an Ace wrap.  He is using compression stocking he obtained from a friend, but when removing, tore his skin on the left and now with an ulcer.  He is not keeping it covered necessarily.   Trying to elevate legs when can.  2.  DM:  A1C today is 6.1%.  Pt. does have frequent urination --taking 80 mg of Furosemide daily.  Has good urine flow and no hesitancy.  Has not had an eye check in past year.    3.  Watery BMs since cholecystectomy.  Stools are really never formed.  Sometimes stools burn anus when coming out and explosive.   4.  Left low back pain when trying to sleep:  has to sleep sitting up at desk, leaning forward--legs on floor--states 4 out of 5 nights.  Has had this problem for years--for some reason, did not think pertinent to treatment of LE edema.  Pain killers have helped with this before.  5.  OSA--not using bipap when sitting up to sleep--though initially states using every night.  Almost always very truncated use as sleeps at desk most nights.  Current  Medications (verified): 1)  K-99 595 Mg Caps (Potassium Gluconate) .Marland Kitchen.. 1 By Mouth Two Times A Day 2)  Protonix 40 Mg Tbec (Pantoprazole Sodium) .Marland Kitchen.. 1 By Mouth Once Daily 3)  Ferrous Sulfate 325 (65 Fe) Mg Tabs (Ferrous Sulfate) .Marland Kitchen.. 1 By Mouth Once Daily 4)  Allopurinol 300 Mg Tabs (Allopurinol) .Marland Kitchen.. 1 By Mouth Once Daily 5)  Lanoxin 0.25 Mg Tabs (Digoxin) .Marland Kitchen.. 1 By Mouth Once Daily 6)  Diltiazem Hcl Er Beads 120 Mg Xr24h-Cap (Diltiazem Hcl Er Beads) .Marland Kitchen.. 1 By Mouth Once Daily 7)  Furosemide 40 Mg Tabs (Furosemide) .... Take 1 Tablet By Mouth Two Times A Day 8)  Atenolol 100 Mg Tabs (Atenolol) .Marland Kitchen.. 1 By Mouth Q Am 1/2 Q Pm 9)  Colcrys 0.6 Mg Tabs (Colchicine) .Marland Kitchen.. 1 Tab By Mouth As Needed For Gouty Flair 10)  Warfarin Sodium 5 Mg Tabs (Warfarin Sodium) .Marland Kitchen.. 1 1/2 Tabs Daily 11)  Calcium-Magnesium-Zinc 750-375-15 Mg Tabs (Calcium-Magnesium-Zinc) .Marland Kitchen.. 1 By Mouth Two Times A Day As Needed 12)  Vitamin C 1000 Mg Tabs (Ascorbic Acid) .Marland Kitchen.. 1 By Mouth Once Daily 13)  Vitamin E 400 Unit Caps (Vitamin E) .Marland Kitchen.. 1 By Mouth Once Daily 14)  B Complex  Tabs (B Complex Vitamins) .... Once Daily 15)  Oxycodone-Acetaminophen 5-325 Mg Tabs (Oxycodone-Acetaminophen) .Marland Kitchen.. 1 Tab By Mouth Every 6 Hours As Needed. 16)  Doxycycline Hyclate 100 Mg Tabs (Doxycycline Hyclate) .Marland Kitchen.. 1 Tab By Mouth Two Times A Day For 10 Days 17)  Niaspan 500 Mg Cr-Tabs (Niacin (Antihyperlipidemic)) .Marland Kitchen.. 1 Tab By Mouth At Bedtime For 7 Days, Then Increase To 2 Tabs At Bedtime  Allergies (verified): 1)  ! Penicillin  Past History:  Past Surgical History: 1.  1988 :Status post mechanical aortic valve 2.  03/2010:  Left Saphenous vein occlusion--laser ablation--Dr. Arbie Cookey   Physical Exam  General:  Obese, NAD Lungs:  Normal respiratory effort, chest expands symmetrically. Lungs are clear to auscultation, no crackles or wheezes. Heart:  Irreg, irreg.  Crisp valve noise Msk:  NT over L/S spinous processesa and lumbar  musculature. Extremities:  Edematous with significant venous congestion,  Woody.  Has 2 circular ulcers on left anterior tibial area--one dime sized, the other nickel sized.  Edges clean.  Tissue pink.  Watery, but no other discharge.   Impression & Recommendations:  Problem # 1:  LOW BACK PAIN SYNDROME (ICD-724.2) This is limiting pt's ability to treat his peripheral edema and OSA  The following medications were removed from the medication list:    Oxycodone-acetaminophen 5-325 Mg Tabs (Oxycodone-acetaminophen) .Marland Kitchen... 1 tab by mouth every 6 hours as needed.  Orders: Diagnostic X-Ray/Fluoroscopy (Diagnostic X-Ray/Flu) Physical Therapy Referral (PT)  Problem # 2:  SLEEP APNEA (ICD-780.57) As above--not using Bipap most of nights of week as sitting up in a different room much of night. Treat back pain to see if can get this better treated and keep him in bed.  Problem # 3:  ULCER, LEG (ICD-707.10) Discussed dressings and getting legs up more often Orders: Wound Care Center Referral (Wound Care)  Problem # 4:  PERIPHERAL EDEMA (ICD-782.3) As above His updated medication list for this problem includes:    Furosemide 40 Mg Tabs (Furosemide) .Marland Kitchen... Take 1 tablet by mouth two times a day  Problem # 5:  DIABETES MELLITUS, TYPE II (ICD-250.00) Controlled with diet.  Problem # 6:  ATRIAL FIBRILLATION, CHRONIC (ICD-427.31) protime 15th of Sept His updated medication list for this problem includes:    Lanoxin 0.25 Mg Tabs (Digoxin) .Marland Kitchen... 1 by mouth once daily    Diltiazem Hcl Er Beads 120 Mg Xr24h-cap (Diltiazem hcl er beads) .Marland Kitchen... 1 by mouth once daily    Atenolol 100 Mg Tabs (Atenolol) .Marland Kitchen... 1 by mouth q am 1/2 q pm    Warfarin Sodium 5 Mg Tabs (Warfarin sodium) .Marland Kitchen... 1 1/2 tabs daily  Complete Medication List: 1)  K-99 595 Mg Caps (Potassium gluconate) .Marland Kitchen.. 1 by mouth two times a day 2)  Protonix 40 Mg Tbec (Pantoprazole sodium) .Marland Kitchen.. 1 by mouth once daily 3)  Ferrous Sulfate 325 (65  Fe) Mg Tabs (Ferrous sulfate) .Marland Kitchen.. 1 by mouth once daily 4)  Allopurinol 300 Mg Tabs (Allopurinol) .Marland Kitchen.. 1 by mouth once daily 5)  Lanoxin 0.25 Mg Tabs (Digoxin) .Marland Kitchen.. 1 by mouth once daily 6)  Diltiazem Hcl Er Beads 120 Mg Xr24h-cap (Diltiazem hcl er beads) .Marland Kitchen.. 1 by mouth once daily 7)  Furosemide 40 Mg Tabs (Furosemide) .... Take 1 tablet by mouth two times a day 8)  Atenolol 100 Mg Tabs (Atenolol) .Marland Kitchen.. 1 by mouth q am 1/2 q pm 9)  Colcrys 0.6 Mg Tabs (Colchicine) .Marland Kitchen.. 1 tab by mouth as needed for gouty flair 10)  Warfarin Sodium 5 Mg Tabs (Warfarin sodium) .Marland Kitchen.. 1 1/2 tabs daily 11)  Calcium-magnesium-zinc 750-375-15 Mg Tabs (Calcium-magnesium-zinc) .Marland Kitchen.. 1 by mouth two times a day as needed 12)  Vitamin C 1000 Mg Tabs (Ascorbic acid) .Marland Kitchen.. 1 by mouth once daily 13)  Vitamin E 400 Unit Caps (Vitamin e) .Marland Kitchen.. 1 by mouth once daily 14)  B Complex Tabs (B complex vitamins) .... Once daily 15)  Niaspan 500 Mg Cr-tabs (Niacin (antihyperlipidemic)) .Marland Kitchen.. 1 tab by mouth at bedtime for 7 days, then increase to 2 tabs at bedtime 16)  Questran 4 Gm Pack (Cholestyramine)  Patient Instructions: 1)  Schedule Retasure. 2)  Use Bacitracin ointment over ulcers and gauze before wrapping 3)  lab visit for protime around 15th of September 4)  Follow up with Dr. Delrae Alfred in 3 months --back pain, edema, ulcer, OSA

## 2010-10-02 NOTE — Assessment & Plan Note (Signed)
Summary: 2 WEEK FOLLOWUP////BC   Vital Signs:  Patient profile:   50 year old male Weight:      242.3 pounds Pulse rate:   76 / minute Pulse rhythm:   regular Resp:     16 per minute BP sitting:   145 / 74  (left arm) Cuff size:   large  Vitals Entered By: Vesta Mixer CMA (December 14, 2009 12:21 PM) CC: 2 week f/u Is Patient Diabetic? No Pain Assessment Patient in pain? yes       Does patient need assistance? Ambulation Normal   Primary Care Provider:  Julieanne Manson MD  CC:  2 week f/u.  History of Present Illness: 1.  Anticoagulation:  Needs protime today--alternating 7.5 mg with 10 mg since last week.  2.  Muscle spasms--abdominal and legs, feet.  Mainly at night.  Relates to use of Lasix.  Is taking the OTC potassium gluconate as does not tolerate Ktabs--feels the latter caused him to have gastric bleeding in past.  Taking one tab of K+ daily--sometimes a second one when has the cramps.  3.  Hypertension/Afib:  Did get short acting Diltiazem two times a day until long acting Diltiazem is in.  4.  Varicosities:  having a lot of pain in legs currently from superficial phlebitis.  Has a new spot on right lower extremity now.  Has not obtained the heating pad to apply warmth to the area.  Not clear that he is elevating legs much of the day as well.  Not using compression stockings as uncomfortable.  No fever.   Recent venous dopplers of lower extremities did not show a DVT.   5.  DM:  not checking sugars.  Allergies (verified): 1)  ! Penicillin  Physical Exam  General:  NAD, obese Extremities:  Severe diffuse varicosities of bilateral lower legs with chronic venous stasis changes involving most of lower legs--woody edema with hyperpigmentation.  Small superficial ulcers about 1 cm in diameter at lateral right lower leg.   Indurated area about a 3 cm in length varicosity that is hardened on lateral right calf -mildly tender. 1 cm  varicosity on medial left calf  with mild hardening and tenderness --no surrounding induration.     Impression & Recommendations:  Problem # 1:  ATRIAL FIBRILLATION, CHRONIC (ICD-427.31)  With AVR  Check protime today His updated medication list for this problem includes:    Lanoxin 0.25 Mg Tabs (Digoxin) .Marland Kitchen... 1 by mouth once daily    Diltiazem Hcl Er Beads 120 Mg Xr24h-cap (Diltiazem hcl er beads) .Marland Kitchen... 1 by mouth once daily    Atenolol 100 Mg Tabs (Atenolol) .Marland Kitchen... 1 by mouth q am 1/2 q pm    Coumadin 5 Mg Tabs (Warfarin sodium) .Marland Kitchen... 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 Suspect noncompliance. Again to keep elevated, warm pack. Will refer to [facility] to see if whether would recommend laser or other therapy to the varicosities  Problem # 3:  DIABETES MELLITUS, TYPE II (ICD-250.00) To bring in sugars at next visit.  Problem # 4:  MUSCLE CRAMPS (ICD-729.82) To increase OTC Potassium gluconate to two times a day on a regular basis and follow electrolytes.  Problem # 5:  ESSENTIAL HYPERTENSION (ICD-401.9) Up a bit today--has not restarted Diltiazem yet--to pick up today. His updated medication list for this problem includes:  Diltiazem Hcl Er Beads 120 Mg Xr24h-cap (Diltiazem hcl er beads) .Marland Kitchen... 1 by mouth once daily    Furosemide 40 Mg Tabs (Furosemide) .Marland Kitchen... Take 1 tablet by mouth two times a day    Atenolol 100 Mg Tabs (Atenolol) .Marland Kitchen... 1 by mouth q am 1/2 q pm  Complete Medication List: 1)  K-99 595 Mg Caps (Potassium gluconate) .Marland Kitchen.. 1 by mouth two times a day 2)  Protonix 40 Mg Tbec (Pantoprazole sodium) .Marland Kitchen.. 1 by mouth once daily 3)  Ferrous Sulfate 325 (65 Fe) Mg Tabs (Ferrous sulfate) .Marland Kitchen.. 1 by mouth once daily 4)  Allopurinol 300 Mg Tabs (Allopurinol) .Marland Kitchen.. 1 by mouth once daily 5)  Lanoxin 0.25 Mg Tabs (Digoxin) .Marland Kitchen.. 1 by mouth once  daily 6)  Diltiazem Hcl Er Beads 120 Mg Xr24h-cap (Diltiazem hcl er beads) .Marland Kitchen.. 1 by mouth once daily 7)  Furosemide 40 Mg Tabs (Furosemide) .... Take 1 tablet by mouth two times a day 8)  Atenolol 100 Mg Tabs (Atenolol) .Marland Kitchen.. 1 by mouth q am 1/2 q pm 9)  Colcrys 0.6 Mg Tabs (Colchicine) .Marland Kitchen.. 1 tab by mouth as needed for gouty flair 10)  Coumadin 5 Mg Tabs (Warfarin sodium) .... Take 1 tablet by mouth once a day 11)  Calcium-magnesium-zinc 750-375-15 Mg Tabs (Calcium-magnesium-zinc) .Marland Kitchen.. 1 by mouth two times a day as needed 12)  Vitamin C 1000 Mg Tabs (Ascorbic acid) .Marland Kitchen.. 1 by mouth once daily 13)  Vitamin E 400 Unit Caps (Vitamin e) .Marland Kitchen.. 1 by mouth once daily 14)  B Complex Tabs (B complex vitamins) .... Once daily 15)  Oxycodone-acetaminophen 5-325 Mg Tabs (Oxycodone-acetaminophen) .Marland Kitchen.. 1 tab by mouth every 6 hours as needed.  Other Orders: Surgical Referral (Surgery)  Patient Instructions: 1)  Protime in 1 week--with BMET.--lab appt. 2)  Increase potassium to two times a day dosing for now 3)  Follow up with Dr. Delrae Alfred in 1 month --bring in sugars Prescriptions: OXYCODONE-ACETAMINOPHEN 5-325 MG TABS (OXYCODONE-ACETAMINOPHEN) 1 tab by mouth every 6 hours as needed.  #30 x 0   Entered and Authorized by:   Julieanne Manson MD   Signed by:   Julieanne Manson MD on 12/14/2009   Method used:   Print then Give to Patient   RxID:   [id number] OXYCODONE-ACETAMINOPHEN 5-325 MG TABS (OXYCODONE-ACETAMINOPHEN) 1 tab by mouth every 6 hours as needed.  #30 x 0   Entered and Authorized by:   Julieanne Manson MD   Signed by:   Julieanne Manson MD on 12/14/2009   Method used:   Print then Give to Patient   RxID:   [id number]

## 2010-10-02 NOTE — Progress Notes (Signed)
Summary: Needs PA for pantoprazole  Phone Note Outgoing Call   Summary of Call: Received prior authorization request for pantoprazole.  Pt. doesn't show a history of having tried and failed preferred meds.  Do you want to change this medication to a preferred?  Initial call taken by: Dutch Quint RN,  June 21, 2010 5:05 PM  Follow-up for Phone Call        Call him and see if he had failed other meds first.  If he has not--try the preferred meds first--OMeprazole 20 mg daily--refills for 11 months Follow-up by: Julieanne Manson MD,  June 25, 2010 6:11 PM  Additional Follow-up for Phone Call Additional follow up Details #1::        Left message on answering machine for pt. to return call.  Dutch Quint RN  June 27, 2010 11:41 AM  Says he's tried Rx'd omeprazole which is preferred, and Prevacid, which is non-preferred.  Would you like to continue with a PA?  Dutch Quint RN  June 27, 2010 3:16 PM  Yes, please  Julieanne Manson MD  June 28, 2010 8:39 AM      Additional Follow-up for Phone Call Additional follow up Details #2::    PA in progress.  Dutch Quint RN  June 28, 2010 11:03 AM  PA was denied. "Criteria not met - pt. needs to try other preferred nexium or a clinical reason they cannot."  Dutch Quint RN  July 10, 2010 10:51 AM  Can you call and find out why it was denied--what criteria is he not meeting? Then please try filling out again. Julieanne Manson MD  July 10, 2010 11:17 AM   Spoke with [facility] -- states that pt. must have tried one other of the preferred meds -- Nexium, omeprazole OTC or Prilosec OTC.  Otherwise, must have a clinical reason documented why pt. cannot/did not take preferred meds.  Dutch Quint RN  July 15, 2010 5:21 PM  Please call Nexium written to pt's preferred pharmacy--unable to find in chart  Julieanne Manson MD  July 17, 2010 11:41 AM    Additional Follow-up for Phone Call Additional follow up Details #3::  Details for Additional Follow-up Action Taken: [facility] informed of medication denial and new Rx call in.  Pt. informed of new Rx.  Dutch Quint RN  July 17, 2010 12:35 PM   New/Updated Medications: NEXIUM 40 MG CPDR (ESOMEPRAZOLE MAGNESIUM) 1 cap by mouth on empty stomach daily Prescriptions: NEXIUM 40 MG CPDR (ESOMEPRAZOLE MAGNESIUM) 1 cap by mouth on empty stomach daily  #30 x 11   Entered and Authorized by:   Julieanne Manson MD   Signed by:   Julieanne Manson MD on 07/17/2010   Method used:   Telephoned to ...       [facility] (retail)       [address] [place], [place]  [address]       Ph: [phone]       Fax: [phone]   RxID:   [id number]

## 2010-10-02 NOTE — Progress Notes (Signed)
  Phone Note From Pharmacy   Summary of Call: Tiffany-I cannot remember which pharmacy he took his prescriptions to--[facility] or [facility]--please call and find out if they have potassium gluconate and what dose that is closest to that written--or if they can get the gluconate. Initial call taken by: Julieanne Manson MD,  November 04, 2009 2:14 PM  Follow-up for Phone Call        [facility]  [facility] they have is K tab .  Also, fyi they no longer have colchicine.  They carry Colcrys now. Follow-up by: Vesta Mixer CMA,  November 07, 2009 12:56 PM  Additional Follow-up for Phone Call Additional follow up Details #1::        Can you check with [facility] to see if they carry potassium gluconate and how much a month supply of that would be. Also--which pharmacy is he using? Finally--let him know his protime is a bit low--want him 2.5-3.5 as he knows.  Have him increase his dose to 7.5 mg on Monday and 5 mg the rest of the week.  Repeat INR in 2 weeks.  Additional Follow-up by: Julieanne Manson MD,  November 10, 2009 2:43 PM    Additional Follow-up for Phone Call Additional follow up Details #2::    Called pt, he is in the hospital having his gall bladder removed.  He woke up short of breath and went to ED and they did a CT scan and his gall bladder was inflamed and angry. Follow-up by: Vesta Mixer CMA,  November 12, 2009 12:20 PM  Additional Follow-up for Phone Call Additional follow up Details #3:: Details for Additional Follow-up Action Taken: Please call [facility] with above question regarding potassium.  Julieanne Manson MD  November 14, 2009 1:13 PM  [facility] they do not carry in the pharmacy because it is otc, they should have it in the aisle, but not sure the price.............. Tiffany McCoy CMA  November 14, 2009 4:49 PM   New/Updated Medications: COLCRYS 0.6 MG TABS (COLCHICINE) 1 tab by mouth as needed for gouty flair COUMADIN 5 MG TABS (WARFARIN SODIUM) 1 1/2 tabs on Mondays, 1 tab by  mouth daily rest of week.

## 2010-10-02 NOTE — Letter (Signed)
Summary: [facility] APPLICATION//FAXED AND MAILED  CONCEALED [facility] APPLICATION//FAXED AND MAILED   Imported By: Arta Bruce 06/26/2010 10:38:07  _____________________________________________________________________  External Attachment:    Type:   Image     Comment:   External Document

## 2010-10-02 NOTE — Assessment & Plan Note (Signed)
Summary: cotton swab in right ear canal  Nurse Visit   Impression & Recommendations:  Problem # 1:  FOREIGN BODY IN EAR (ICD-931)  attempted to remove cotton swab in ear canal with ear curette no other instruments available concerned about attempting further extraction and puncturing ear drum will refer to ENT  orange card expired patient sent to ED for extraction Tereso Newcomer PA-C  April 02, 2010 4:11 PM   Orders: ENT Referral (ENT)  Complete Medication List: 1)  K-99 595 Mg Caps (Potassium gluconate) .Marland Kitchen.. 1 by mouth two times a day 2)  Protonix 40 Mg Tbec (Pantoprazole sodium) .Marland Kitchen.. 1 by mouth once daily 3)  Ferrous Sulfate 325 (65 Fe) Mg Tabs (Ferrous sulfate) .Marland Kitchen.. 1 by mouth once daily 4)  Allopurinol 300 Mg Tabs (Allopurinol) .Marland Kitchen.. 1 by mouth once daily 5)  Lanoxin 0.25 Mg Tabs (Digoxin) .Marland Kitchen.. 1 by mouth once daily 6)  Diltiazem Hcl Er Beads 120 Mg Xr24h-cap (Diltiazem hcl er beads) .Marland Kitchen.. 1 by mouth once daily 7)  Furosemide 40 Mg Tabs (Furosemide) .... Take 1 tablet by mouth two times a day 8)  Atenolol 100 Mg Tabs (Atenolol) .Marland Kitchen.. 1 by mouth q am 1/2 q pm 9)  Colcrys 0.6 Mg Tabs (Colchicine) .Marland Kitchen.. 1 tab by mouth as needed for gouty flair 10)  Warfarin Sodium 5 Mg Tabs (Warfarin sodium) .Marland Kitchen.. 1 1/2 tabs daily 11)  Calcium-magnesium-zinc 750-375-15 Mg Tabs (Calcium-magnesium-zinc) .Marland Kitchen.. 1 by mouth two times a day as needed 12)  Vitamin C 1000 Mg Tabs (Ascorbic acid) .Marland Kitchen.. 1 by mouth once daily 13)  Vitamin E 400 Unit Caps (Vitamin e) .Marland Kitchen.. 1 by mouth once daily 14)  B Complex Tabs (B complex vitamins) .... Once daily 15)  Oxycodone-acetaminophen 5-325 Mg Tabs (Oxycodone-acetaminophen) .Marland Kitchen.. 1 tab by mouth every 6 hours as needed. 16)  Doxycycline Hyclate 100 Mg Tabs (Doxycycline hyclate) .Marland Kitchen.. 1 tab by mouth two times a day for 10 days 17)  Niaspan 500 Mg Cr-tabs (Niacin (antihyperlipidemic)) .Marland Kitchen.. 1 tab by mouth at bedtime for 7 days, then increase to 2 tabs at bedtime   Patient  Instructions: 1)  We will refer you to ENT to remove the object from your ear.  CC: c/o qtip head stuck in right ear Pain Assessment Patient in pain? no        Primary Care Provider:  Julieanne Manson MD  CC:  c/o qtip head stuck in right ear.  History of Present Illness: Trying to remove ear wax, used q-tip in right ear and says the cotton got stuck in his ear.  Tried rinsing with peroxide with no success.  Here in office to have object removed.   Review of Systems ENT:  Complains of decreased hearing and earache; cotton stuck in right ear, slight ear discomfort.   Physical Exam  Ears:  cotton visible in the inner ear; TM not visible.  Slight erythema on bottom mid-ear.   Allergies: 1)  ! Penicillin  Orders Added: 1)  ENT Referral [ENT] 2)  Est. Patient Level II [46962]

## 2010-10-02 NOTE — Letter (Signed)
Summary: Historic Patient File  Historic Patient File   Imported By: Arta Bruce 04/30/2010 14:31:38  _____________________________________________________________________  External Attachment:    Type:   Image     Comment:   External Document

## 2010-10-02 NOTE — Assessment & Plan Note (Signed)
Summary: ULCER FOOT///KT   Vital Signs:  Patient profile:   50 year old male Weight:      240 pounds Temp:     98.1 degrees F Pulse rate:   74 / minute Pulse rhythm:   regular Resp:     18 per minute BP sitting:   123 / 70  (right arm) Cuff size:   large  Vitals Entered By: Vesta Mixer CMA (Jan 25, 2010 9:11 AM) CC: ulcer right lower leg Is Patient Diabetic? No Pain Assessment Patient in pain? no     Location: right leg Intensity: 0  Does patient need assistance? Ambulation Normal   Primary Care Provider:  Julieanne Manson MD  CC:  ulcer right lower leg.  History of Present Illness: 1.  Second Venous stasis ulcer opened up since last seen here.  Has developed redness, increased swelling, pain.  Will not be at [facility] for varicosities until 02/06/10.  No definite fever.  Pt. is trying to elevate legs as much as possible.  2.  Dyslipidemia:  Has been on slo niacin in past to treat dyslipidemia--tolerated fine.    3.  Anticoagulation:  INR at high end of therapeutic range.  Taking Warfarin 7.5 mg daily.  Allergies (verified): 1)  ! Penicillin  Physical Exam  Extremities:  Right leg with 2 nickel sized ulcers.  No purulent discharge, but some adherent and thickened blood.  Surrounding erythema and swelling of skin--shiny.  Warm to touch.  Area mildly tender.   Impression & Recommendations:  Problem # 1:  CELLULITIS, LEG, RIGHT (ICD-682.6)  complicating venous stasis ulcers. Start Bactrim  His updated medication list for this problem includes:    Doxycycline Hyclate 100 Mg Tabs (Doxycycline hyclate) .Marland Kitchen... 1 tab by mouth two times a day for 10 days  Orders: Wound Care Center Referral (Wound Care)  Problem # 2:  DYSLIPIDEMIA (ICD-272.4) Start Niaspan--discussed flushing His updated medication list for this problem includes:    Niaspan 500 Mg Cr-tabs (Niacin (antihyperlipidemic)) .Marland Kitchen... 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 1 by mouth two times a day 2)  Protonix 40 Mg Tbec (Pantoprazole sodium) .Marland Kitchen.. 1 by mouth once daily 3)  Ferrous Sulfate 325 (65 Fe) Mg Tabs (Ferrous sulfate) .Marland Kitchen.. 1 by mouth once daily 4)  Allopurinol 300 Mg Tabs (Allopurinol) .Marland Kitchen.. 1 by mouth once daily 5)  Lanoxin 0.25 Mg Tabs (Digoxin) .Marland Kitchen.. 1 by mouth once daily 6)  Diltiazem Hcl Er Beads 120 Mg Xr24h-cap (Diltiazem hcl er beads) .Marland Kitchen.. 1 by mouth once daily 7)  Furosemide 40 Mg Tabs (Furosemide) .... Take 1 tablet by mouth two times a day 8)  Atenolol 100 Mg Tabs (Atenolol) .Marland Kitchen.. 1 by mouth q am 1/2 q pm 9)  Colcrys 0.6 Mg Tabs (Colchicine) .Marland Kitchen.. 1 tab by mouth as needed for gouty flair 10)  Warfarin Sodium 5 Mg Tabs (Warfarin sodium) .Marland Kitchen.. 1 1/2 tabs daily 11)  Calcium-magnesium-zinc 750-375-15 Mg Tabs (Calcium-magnesium-zinc) .Marland Kitchen.. 1 by mouth two times a day as needed 12)  Vitamin C 1000 Mg Tabs (Ascorbic acid) .Marland Kitchen.. 1 by mouth once daily 13)  Vitamin E 400 Unit Caps (Vitamin e) .Marland Kitchen.. 1 by mouth once daily 14)  B Complex  Tabs (B complex vitamins) .... Once daily 15)  Oxycodone-acetaminophen 5-325 Mg Tabs (Oxycodone-acetaminophen) .Marland Kitchen.. 1 tab by mouth every 6 hours as needed. 16)  Doxycycline Hyclate 100 Mg Tabs (Doxycycline hyclate) .Marland Kitchen.. 1 tab by mouth two times a day for 10 days 17)  Niaspan 500 Mg Cr-tabs (Niacin (antihyperlipidemic)) .Marland Kitchen.. 1 tab by mouth at bedtime for 7 days, then increase to 2 tabs at bedtime  Patient Instructions: 1)  Follow up with Dr. Delrae Alfred on Tuesday.  Protime then as well. Prescriptions: NIASPAN 500 MG CR-TABS (NIACIN (ANTIHYPERLIPIDEMIC)) 1 tab by mouth at bedtime for 7 days, then increase to 2 tabs at bedtime  #60 x 6   Entered and Authorized by:   Julieanne Manson MD   Signed by:    Julieanne Manson MD on 01/25/2010   Method used:   Print then Give to Patient   RxID:   [id number] DOXYCYCLINE HYCLATE 100 MG TABS (DOXYCYCLINE HYCLATE) 1 tab by mouth two times a day for 10 days  #20 x 0   Entered and Authorized by:   Julieanne Manson MD   Signed by:   Julieanne Manson MD on 01/25/2010   Method used:   Print then Give to Patient   RxID:   [id number]

## 2010-10-02 NOTE — Letter (Signed)
[facility]   Imported By: Arta Bruce 07/29/2010 09:31:17  _____________________________________________________________________  External Attachment:    Type:   Image     Comment:   External Document

## 2010-10-02 NOTE — Progress Notes (Signed)
  Phone Note Outgoing Call   Summary of Call: Tiffany, I sent you the flag from Dr. Delrae Alfred. Please check with the patient to see if he is on name brand coumadin. Let me know.  May need to adjust what he is doing.  Initial call taken by: Brynda Rim,  December 19, 2009 11:23 AM  Follow-up for Phone Call        Left message on answering machine for pt to return call  Follow-up by: Vesta Mixer CMA,  December 19, 2009 11:45 AM  Additional Follow-up for Phone Call Additional follow up Details #1::        Pt called back and he is taking the warfarin now, although he does not like it. Additional Follow-up by: Vesta Mixer CMA,  December 19, 2009 11:51 AM    Additional Follow-up for Phone Call Additional follow up Details #2::    Ok. Instead, have him take warfarin 5 mg once daily except 7.5 mg on Sat and Sun. Still check his INR on Monday 4/25.  Dr. Delrae Alfred, I noticed you had his goal as 2.5 to 3.5 in one of your notes. Goal INR for mechanical AVRs is now 2-3.  Wasn't sure if you had him a little higher with a h/o stroke . . . I wasn't sure of the details of his CVA. He has added risk factor of atrial fibrillation which bumps him up to higher INR goal.  Julieanne Manson MD  December 26, 2009 9:31 AM  Follow-up by: Tereso Newcomer PA-C,  December 19, 2009 12:38 PM  Additional Follow-up for Phone Call Additional follow up Details #3:: Details for Additional Follow-up Action Taken: Pt notified of dose change and will come in on Monday 4.25.11............ Elmarie Shiley McCoy CMA  December 19, 2009 12:58 PM

## 2010-10-02 NOTE — Progress Notes (Signed)
Summary: Requesting the Medical Assistant call him back  Phone Note Call from Patient   Summary of Call: The pt wants Tiffany call him back to talk about his coumadin and discuss about the surgery (July 8th) Nyron Mozer MD Initial call taken by: Manon Hilding,  February 27, 2010 8:56 AM  Follow-up for Phone Call        Pt is scheduled to come in on 03/08/10 for his pt/inr, but he is going to be having surgery on his leg veins that day.  He has to stop his coumadin Monday.  He wants to know do you want to check his coumadin this week or wait until after the surgery and also he wants to not do the shots when the surgery is done to get his blood back thin again, he just wants to take the pills to get it regulated. Follow-up by: Vesta Mixer CMA,  February 27, 2010 11:55 AM  Additional Follow-up for Phone Call Additional follow up Details #1::        Cancel the protime for the 8th. He needs to be on anticoagulation rather soon after the surgery, so will likely need to do the shots Additional Follow-up by: Julieanne Manson MD,  March 01, 2010 6:06 PM    Additional Follow-up for Phone Call Additional follow up Details #2::    left msg on pt's voice mail with above info. Follow-up by: Vesta Mixer CMA,  March 07, 2010 12:02 PM  Additional Follow-up for Phone Call Additional follow up Details #3:: Details for Additional Follow-up Action Taken: Spoke with Dr. Romeo Rabon and pt can resume coumadin at previous dose and per Dr. Delrae Alfred  recheck levels next Friday. Left detailed msg on pt's voicemail........ Vesta Mixer CMA  March 08, 2010 4:06 PM  Additional Follow-up by: Vesta Mixer CMA,  March 08, 2010 4:06 PM

## 2010-10-02 NOTE — Initial Assessments (Signed)
Summary: H&P  INTERNAL MEDICINE TEACHING SERVICE ADMISSION HISTORY & PHYSICAL   Attending: Dr. Julaine Fusi First contact: Dr. Doreen Beam [phone] Second contact: Dr. Aldine Contes [phone] [facility], after-hours: [phone], [phone]    PCP: Dr. Julieanne Manson   CC: Abdominal Pain   HPI: 50 yo man with multiple medical problems including aortic valve replacement, afib on coumadin, OSA, DMII who presents with shortness of breath that has been worsening over the last few days. He says that the SOB has increased to the point where he was speaking in broken sentences at rest. He says that he also had some dry-heaving but no vomiting this morning. He also has had an increase in how "gassy" he is, but otherwise has no complaints. His main issue at this point is the SOB, and he says that he would not have presented for medical attention had it not been for the SOB.  He says that he has some abdominal pain at time right in the "fold" of his abdomen but that otherwise he says he feels confident that we can press over his entire abdomen without him having any pain. He denies any RUQ pain specifically. He says that he has no abdominal pain, nausea, vomiting, or change in bowel habits after eating. He does have chronic loose stools for his "entire life" where he has 4-5 loose, watery bowel movements/day, but this in unchanged.   He denies any chest pain, syncope, fevers, chills   He has chronic back pain.  Allergies: ! PENICILLIN   Past Medical History:   1. Status post mechanical aortic valve replaced at 50 years old secondary to a traumatic incident requiring a saphenous vein graft bypass to the right coronary artery.   Echo: 10/22/2009: EF 60-65%, prosthesis normal. LA dilated.    Lexiscan Myoview on October 31, 2009, revealed moderate inferolateral wall scar at the mid and apical level with no        ischemia.  Left ventricular ejection fraction equal to 52%, but  visually lower with inferior and apical  hypokinesis.       2. Hypertension.   3. Peripheral vascular disease by his account.   4. Obesity.   5. Atrial fibrillation currently on Coumadin therapy.   6. Varicose veins  7. Peripheral edema  8. h/o CAD  9. DM Type II, A1c 6.1 10-2009  10. Iron def anemia: Status post esophagogastric duodenoscopy revealing small  hiatal hernia.  Few red spots in the distal stomach and antrum  versus mild gastritis and tiny erosions that could not be made to   bleed with washing.  Otherwise, normal EGD without signs of active  bleeding.  11. Gout 12. Reported hx of TIA/Stroke X3 leaving no major neuro deficits 13. Hiatal Hernia, by EGD 10-2009 14.  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 Obstructive Sleep Apnea, on CPAP at 11  16. Chronic Diarrhea 17. Chronic back pain, s/p radiation therapy age 80 for testicular cancer 18. Hx testicular cancer, s/p orchiectomy and radiation therapy, age 76    Past Surgical History: Status post mechanical aortic valve    Current Meds:  K-99 595 MG CAPS (POTASSIUM GLUCONATE) 1 by mouth two times a day PROTONIX 40 MG TBEC (PANTOPRAZOLE SODIUM) 1 by mouth once daily FERROUS SULFATE 325 (65 FE) MG TABS (FERROUS SULFATE) 1 by mouth once daily ALLOPURINOL 300 MG TABS (ALLOPURINOL) 1 by mouth once daily LANOXIN  0.25 MG TABS (DIGOXIN) 1 by mouth once daily DILTIAZEM HCL ER BEADS 120 MG XR24H-CAP (DILTIAZEM HCL ER BEADS) 1 by mouth once daily FUROSEMIDE 40 MG TABS (FUROSEMIDE) 1 by mouth two times a day ATENOLOL 100 MG TABS (ATENOLOL) 1 by mouth q am 1/2 q pm COLCRYS 0.6 MG TABS (COLCHICINE) 1 tab by mouth as needed for gouty flair ASPIRIN 81 MG TBEC (ASPIRIN) 1 by mouth once daily COUMADIN 5 MG TABS (WARFARIN SODIUM) 1 1/2 tabs on Mondays, 1 tab by mouth daily rest of week. CALCIUM-MAGNESIUM-ZINC 750-375-15 MG TABS (CALCIUM-MAGNESIUM-ZINC) 3 by mouth once daily VITAMIN C 1000 MG TABS (ASCORBIC  ACID) 1 by mouth once daily VITAMIN E 400 UNIT CAPS (VITAMIN E) 1 by mouth once daily B COMPLEX  TABS (B COMPLEX VITAMINS) once daily    Social History:  He lives in the [place] area with his wife.  They stay with friends or live in the car.  He is currently unemployed and  seeking disability.  He denies cigarette smoking but states he smokes an   occasional cigar.  He denies alcohol or drug abuse.       Family History: His mother died at age 93 and his father also died in  his late 64s, neither one with known coronary artery disease and no  siblings have heart disease.       Review of System: Negative except per HPI.    Vital Signs: BP-125/70 HR-83 RR-20 Temp-Afebrile Sat-95%/ra  Physical Exam: GEN- Not in distress HEENT- NCAT, PERRL, no icterus/pallor LUNGS- minimal crackles at bases;  no wheezes CVS- regular rate, irregular rhythm; 3/6 SEM c/w mechanical aortic value ABD- soft, non-tender, normal bowel sounds.   EXT- +++ LE edema bilaterally with skin hyperpigmentation and thickening c/w chronic stasis edema NEURO- no focal deficit. PSY- appropriate.    LAB RESULTS            10 9.2 >--------<137          ANC: 7.4                MCV:82.4   135       102      11 ----------------------------------< 160    3.9        26        0.84  Calcium:8.6       Anion gap:   7      Bilirubin: 1.3         Alk phos: 43       AST:   24      ALT:  18        Protein:   6.5     Albumin: 3.6 UA: normal PT-INR: 2.07  CXR: cardiomegaly and mild pulmonary edema, no focal consolidation EKG: A fib, 80 bpm, incomplete LBBB CT abd and pelvis: Gall bladder wall thickening, numerous stones. R inguinal hydrocele v lymphocele, cardiomegaly with bilateral pleural effusions.   ASSESSMENT & PLAN   Acute Cholecystitis: This patient does not seem to have typical clinical symptoms of cholecystitis, but he does have an elevated WBC, gallbladder wall thickening and gallstones on exam. Surgery  has examined this patient and feels that cholecystectomy is warranted as soon as INR in normal. Coumadin has been held and patient will be started on heparin drip instead. Per [facility] note Dr. Antoine Poche was called about this patient and gave verbal cardiac clearance as he was recently here for atypical chest pain and had a negative myoview stress test  a few weeks ago. Per the Revised Goldman Cardiac Risk Index, he has risk factors of prior stroke, likely prior MI, and possible heart failure, he is relatively high risk. He can continue beta blocker perioperatively as risk reduction. Will change atenolol to metoprolol as it can be given IV around the time of surgery.  Will plan cipro/flagyl for Abx coverage and make NPO for surgery.   Hx Mechanical Aortic Valve Replacement: Will hold coumadin for surgery. Will place on heparin and bridge back to coumadin after surgery.   Hx Afib: On coumadin which will be held in setting of surgery. Will use heparin instead. Continue diltiazem, digoxin, and metoprolol.  SOB: Unclear etiology but does appear to be slightly volume overloaded on exam with bilateral LE edema. ?CHF? Will check BNP, place on low IV fluids as will be NPO at 77 cc/Hr. Will check Is&Os, daily weights, and Lasix as needed.   Hx OSA: Will place on CPAP with home setting 11.  Hx Iron Deficiency Anemia: Had recent EGD showing mild gastritis and tiny erosions. Dr Evette Cristal saw this patient in 10-2009 and felt that another colonoscopy was not needed at this time. Will not plan further workup at this moment. Of note, Hg is stable with prior values and will be monitored closely. Will continue Fe supplementation post-operatively.  Diabetes II: A1c 6.1 10-2009, which is in non-diabetic range. WIll monitor CBGs and use SSI as needed, but as he has been on no recent therapy I do not think glucose control is a major issue in this patient at this time.  DVT prophy: Will be on heparin drip and has been therapeutic on  coumadin.   ATTENDING PHYSICIAN: I performed and/or observed a history and physical examination of the patient.  I discussed the case with the residents as noted and reviewed the residents' notes.  I agree with the findings and plan--please refer to the attending physician note for more details.  Signature________________________________  Printed Name_____________________________

## 2010-10-02 NOTE — Progress Notes (Signed)
Summary: CANT GET HIS DILTIAZEM AT HEALTH DEPT  Phone Note Call from Patient Call back at [facility] Phone [phone]   Summary of Call: Lance Cook PT. Lance Cook SAYS THAT [facility] DOES NOT HAVE DILTIAZEM OR THE DOSAGE, AND ITS GOING TO TAKE THEM 3-5 DAYS BEFORE THEY GET IT IN.  SO HE WANTS TO KNOW WHAT DOES HE NEED TO DO. Initial call taken by: Leodis Rains,  December 07, 2009 12:50 PM  Follow-up for Phone Call        Called and spoke with [facility]--they will not get Diltiazem ER in until next week--sent his Rx over to [facility] pharmacy to see if they could help him out fo a week.  [facility] does not have the ER, but does have regular strength.  Faxed over Diltiazem 60 mg by mouth two times a day for 1 week.  Called pt. and left message to go back to [facility] pharmacy and pick up medication. Also asked him to change Coumadin to 10 mg alternating with 7.5 mg every other day and come in for repeat protime in 1 week.  Please try and reach him again today and make sure he got the message. Follow-up by: Julieanne Manson MD,  December 07, 2009 2:07 PM  Additional Follow-up for Phone Call Additional follow up Details #1::        Per [place], pt's med situation is worked out. Additional Follow-up by: Vesta Mixer CMA,  December 07, 2009 2:58 PM    New/Updated Medications: DILTIAZEM HCL 60 MG TABS (DILTIAZEM HCL) 1 tab by mouth two times a day for 7 days until ER comes in at [facility] Prescriptions: DILTIAZEM HCL 60 MG TABS (DILTIAZEM HCL) 1 tab by mouth two times a day for 7 days until ER comes in at [facility]  #14 x 0   Entered by:   Julieanne Manson MD   Authorized by:   Vesta Mixer CMA   Signed by:   Julieanne Manson MD on 12/07/2009   Method used:   Faxed to ...       [facility] - [facility] (retail)       [address] [place], [place]  [address]       Ph: [phone]       Fax: [phone]   RxID:   [id number]

## 2010-10-02 NOTE — Progress Notes (Signed)
Summary: REF TO UROLOGIST & BACK DR  Phone Note Call from Patient Call back at Home Phone [phone]   Reason for Call: Referral Summary of Call: MULBERRY PT. MR Ribble CALLED TO FIND OUT HOW SOON HE CAN GET HIS REFERRALS TO THE UROLOGIST  AND THE BACK DR. HE SAYS THAT HE IS IN A LOT OF PAIN Initial call taken by: Leodis Rains,  August 02, 2010 3:35 PM  Follow-up for Phone Call        Arna Medici, can you check on the Urology referral for me please? Thanks.  Follow-up by: Hale Drone CMA,  August 07, 2010 8:18 AM  Additional Follow-up for Phone Call Additional follow up Details #1::        Is the first time I see the referral so I will work on it .Marland KitchenCheryll Dessert  August 07, 2010 10:41 AM Pt have an appt 08-09-10 @ 1:30pm @ [facility] with Dr Londell Moh . Also pt was complaining about his referral of pt saying that he nned that appt to that his back problem is not a joke an important for him to so I call pt and made an appt for 08-19-10 @ 3:30pm pt aware of those appts   Additional Follow-up by: [name],  August 07, 2010 5:08 PM

## 2010-10-02 NOTE — Progress Notes (Signed)
Summary: [facility] referral  Phone Note Outgoing Call   Call placed by: Julieanne Manson MD,  December 15, 2009 7:55 PM Details for Reason: [facility] referral Summary of Call: Lance Cook--referral to [facility]--see order and letter.  May want to send my initial and last OV notes. Initial call taken by: Julieanne Manson MD,  December 14, 2009 7:20 PM

## 2010-10-02 NOTE — Medication Information (Signed)
Summary: RX Folder//PA/PANTOPTAZOLE//APPROVED  RX Folder//PA/PANTOPTAZOLE//APPROVED   Imported By: Arta Bruce 07/18/2010 14:54:02  _____________________________________________________________________  External Attachment:    Type:   Image     Comment:   External Document

## 2010-10-02 NOTE — Letter (Signed)
Summary: REFERRAL/MYOCARDIAL INFARCTION  REFERRAL/MYOCARDIAL INFARCTION   Imported By: Arta Bruce 07/29/2010 09:34:54  _____________________________________________________________________  External Attachment:    Type:   Image     Comment:   External Document

## 2010-10-02 NOTE — Letter (Signed)
Summary: *Referral Letter  [facility]  [address] [place], [place] [address]   Phone: [phone]  Fax: [phone]    12/14/2009  Thank you in advance for agreeing to see my patient:  Lance Cook [address] [place], [place]  [address]  Phone: [phone]  Reason for Referral: [name] is interested in whether he is a candidate for more definitive treatment of his severe lower extremity varicosities.  He does not tolerate compression stockings and I am not sure how compliant he is with elevation of his legs. More recently, he has been experiencing some superficial thrombophlebitis of his varicosities following a cholecystectomy.  He is now therapeutic on Coumadin and hopefully this will be of some help.    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) 4)  LEG PAIN, LEFT (ICD-729.5) 5)  VARICOSE VEINS, LOWER EXTREMITIES (ICD-454.9) 6)  PERIPHERAL EDEMA (ICD-782.3) 7)  MYOCARDIAL INFARCTION, HX OF (ICD-412) 8)  DIABETES MELLITUS, TYPE II (ICD-250.00) 9)  INTERNAL HEMORRHOIDS (ICD-455.0) 10)  ANEMIA, IRON DEFICIENCY (ICD-280.9) 11)  AORTIC VALVE REPLACEMENT, HX OF (ICD-V43.3) 12)  COUMADIN THERAPY (ICD-V58.61) 13)  ATRIAL FIBRILLATION, CHRONIC (ICD-427.31) 14)  GOUT (ICD-274.9)   Current Medications: 1)  K-99 595 MG CAPS (POTASSIUM GLUCONATE) 1 by mouth two times a day 2)  PROTONIX 40 MG TBEC (PANTOPRAZOLE SODIUM) 1 by mouth once daily 3)  FERROUS SULFATE 325 (65 FE) MG TABS (FERROUS SULFATE) 1 by mouth once daily 4)  ALLOPURINOL 300 MG TABS (ALLOPURINOL) 1 by mouth once daily 5)  LANOXIN 0.25 MG TABS (DIGOXIN) 1 by mouth once daily 6)  DILTIAZEM HCL ER BEADS 120 MG XR24H-CAP (DILTIAZEM HCL ER BEADS) 1 by mouth once daily 7)  FUROSEMIDE 40 MG TABS (FUROSEMIDE) Take 1 tablet by mouth two times a day 8)  ATENOLOL 100 MG  TABS (ATENOLOL) 1 by mouth q am 1/2 q pm 9)  COLCRYS 0.6 MG TABS (COLCHICINE) 1 tab by mouth as needed for gouty flair 10)  COUMADIN 5 MG TABS (WARFARIN SODIUM) Take 1 tablet by mouth once a day 11)  CALCIUM-MAGNESIUM-ZINC 750-375-15 MG TABS (CALCIUM-MAGNESIUM-ZINC) 1 by mouth two times a day as needed 12)  VITAMIN C 1000 MG TABS (ASCORBIC ACID) 1 by mouth once daily 13)  VITAMIN E 400 UNIT CAPS (VITAMIN E) 1 by mouth once daily 14)  B COMPLEX  TABS (B COMPLEX VITAMINS) once daily 15)  OXYCODONE-ACETAMINOPHEN 5-325 MG TABS (OXYCODONE-ACETAMINOPHEN) 1 tab by mouth every 6 hours as needed.   Past Medical History: 1)   1. Status post mechanical aortic valve replaced at 50 years old  2)       secondary to a traumatic incident requiring a saphenous vein graft  3)       bypass to the right coronary artery.  4)   2. Hypertension.  5)   3. Peripheral vascular disease by his account.  6)   4. Obesity.  7)   5. Atrial fibrillation currently on Coumadin therapy.  8)   6. Varicose veins 9)   7. Morbid obesity 10)       Thank you again for agreeing to see our patient; please contact us if you have any further questions or need additional information.  Sincerely,  Julieanne Manson MD

## 2010-10-02 NOTE — Letter (Signed)
Summary: CARDIAC & PULMONARY  CARDIAC & PULMONARY   Imported By: Arta Bruce 06/12/2010 10:20:53  _____________________________________________________________________  External Attachment:    Type:   Image     Comment:   External Document

## 2010-10-02 NOTE — Assessment & Plan Note (Signed)
Summary: HOSP FU///KT   Vital Signs:  Patient profile:   50 year old male Weight:      248.56 pounds BMI:     48.72 Temp:     98.9 degrees F Pulse rate:   103 / minute Pulse rhythm:   regular Resp:     28 per minute BP sitting:   139 / 81  (right arm) Cuff size:   regular  Vitals Entered By: Chauncy Passy, SMA CC: Pt. is here for a f/u from [facility]. Gall bladder has been removed about 2 weeks ago. Pt. states he feels better but sometimes when standing for a long time will have abd. pain. He is taking percocet Is Patient Diabetic? No Pain Assessment Patient in pain? no       Does patient need assistance? Functional Status Self care Ambulation Normal   CC:  Pt. is here for a f/u from [facility]. Gall bladder has been removed about 2 weeks ago. Pt. states he feels better but sometimes when standing for a long time will have abd. pain. He is taking percocet.  History of Present Illness: 1.  Status post cholecystectomy:  abdominal pain resolved.  2.  Painful, hard varicosity in left lower leg--tender.  Has been keeping leg up.  Current Medications (verified): 1)  K-99 595 Mg Caps (Potassium Gluconate) .Marland Kitchen.. 1 By Mouth Two Times A Day 2)  Protonix 40 Mg Tbec (Pantoprazole Sodium) .Marland Kitchen.. 1 By Mouth Once Daily 3)  Ferrous Sulfate 325 (65 Fe) Mg Tabs (Ferrous Sulfate) .Marland Kitchen.. 1 By Mouth Once Daily 4)  Allopurinol 300 Mg Tabs (Allopurinol) .Marland Kitchen.. 1 By Mouth Once Daily 5)  Lanoxin 0.25 Mg Tabs (Digoxin) .Marland Kitchen.. 1 By Mouth Once Daily 6)  Diltiazem Hcl Er Beads 120 Mg Xr24h-Cap (Diltiazem Hcl Er Beads) .Marland Kitchen.. 1 By Mouth Once Daily 7)  Furosemide 40 Mg Tabs (Furosemide) .... Take 1 Tablet By Mouth Once A Day 8)  Atenolol 100 Mg Tabs (Atenolol) .Marland Kitchen.. 1 By Mouth Q Am 1/2 Q Pm 9)  Colcrys 0.6 Mg Tabs (Colchicine) .Marland Kitchen.. 1 Tab By Mouth As Needed For Gouty Flair 10)  Coumadin 5 Mg Tabs (Warfarin Sodium) .... Take 1 Tablet By Mouth Once A Day 11)  Calcium-Magnesium-Zinc 750-375-15 Mg Tabs (Calcium-Magnesium-Zinc) ....  3 By Mouth Once Daily 12)  Vitamin C 1000 Mg Tabs (Ascorbic Acid) .Marland Kitchen.. 1 By Mouth Once Daily 13)  Vitamin E 400 Unit Caps (Vitamin E) .Marland Kitchen.. 1 By Mouth Once Daily 14)  B Complex  Tabs (B Complex Vitamins) .... Once Daily 15)  Lovenox 120 Mg/0.55ml Soln (Enoxaparin Sodium) .... Inject Under The Skin Twice Daily  Allergies (verified): 1)  ! Penicillin  Physical Exam  Lungs:  Normal respiratory effort, chest expands symmetrically. Lungs are clear to auscultation, no crackles or wheezes. Heart:  Irregularly, irregular with crisp valve noise Extremities:  Chronic severe venous stasis changes to bilateral legs.  Knuckle of hard varicosity with mild surrounding erythema, medial lower leg--tender to palpation.  Rest of calf nontender.   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 1 by mouth two times a day 2)  Protonix 40 Mg Tbec (Pantoprazole sodium) .Marland Kitchen.. 1 by mouth once daily 3)  Ferrous Sulfate 325 (65 Fe) Mg  Tabs (Ferrous sulfate) .Marland Kitchen.. 1 by mouth once daily 4)  Allopurinol 300 Mg Tabs (Allopurinol) .Marland Kitchen.. 1 by mouth once daily 5)  Lanoxin 0.25 Mg Tabs (Digoxin) .Marland Kitchen.. 1 by mouth once daily 6)  Diltiazem Hcl Er Beads 120 Mg Xr24h-cap (Diltiazem hcl er beads) .Marland Kitchen.. 1 by mouth once daily 7)  Furosemide 40 Mg Tabs (Furosemide) .... Take 1 tablet by mouth once a day 8)  Atenolol 100 Mg Tabs (Atenolol) .Marland Kitchen.. 1 by mouth q am 1/2 q pm 9)  Colcrys 0.6 Mg Tabs (Colchicine) .Marland Kitchen.. 1 tab by mouth as needed for gouty flair 10)  Coumadin 5 Mg Tabs (Warfarin sodium) .... Take 1 tablet by mouth once a day 11)  Calcium-magnesium-zinc 750-375-15 Mg Tabs (Calcium-magnesium-zinc) .... 3 by mouth once daily 12)   Vitamin C 1000 Mg Tabs (Ascorbic acid) .Marland Kitchen.. 1 by mouth once daily 13)  Vitamin E 400 Unit Caps (Vitamin e) .Marland Kitchen.. 1 by mouth once daily 14)  B Complex Tabs (B complex vitamins) .... Once daily 15)  Lovenox 120 Mg/0.28ml Soln (Enoxaparin sodium) .... Inject under the skin twice daily  Other Orders: Vascular Clinic (Vascular)  Patient Instructions: 1)  Follow up with Dr. Delrae Alfred in 2 weeks--left leg pain

## 2010-10-02 NOTE — Progress Notes (Signed)
Summary: cardiac rehab  Phone Note Outgoing Call   Summary of Call: Called pt. regarding cardiac rehab--have received papers from rehab--they recommend getting recommendations from Dr. Rhetta Mura with pt. and he did follow up with Dr. Domingo Dimes ask him to address.  Flag sent to Dr. Antoine Poche.  I will be out next week, but will readdress upon returning Initial call taken by: Julieanne Manson MD,  December 15, 2009 7:56 PM  Follow-up for Phone Call        Refer to rehab.  Pt with previous cardiac surgery. Follow-up by: Rollene Rotunda, MD, [facility],  December 27, 2009 12:51 PM

## 2010-10-02 NOTE — Progress Notes (Signed)
  Pt signed ROI, I sent to [facility]  February 13, 2010 1:21 PM

## 2010-10-02 NOTE — Letter (Signed)
[facility]   Imported By: Marylou Mccoy 06/21/2010 13:04:43  _____________________________________________________________________  External Attachment:    Type:   Image     Comment:   External Document

## 2010-10-02 NOTE — Miscellaneous (Signed)
Summary: Physician Order/Treatment Plan   Physician Order/Treatment Plan   Imported By: Roderic Ovens 05/30/2010 15:01:12  _____________________________________________________________________  External Attachment:    Type:   Image     Comment:   External Document

## 2010-10-02 NOTE — Progress Notes (Signed)
Summary: Query:  Refill Questran?  Phone Note Call from Patient   Caller: Patient Reason for Call: Refill Medication Summary of Call: med refill for chol med please send to [facility] pharmacy  Initial call taken by: Oscar La,  May 17, 2010 12:38 PM  Follow-up for Phone Call        I don't see where we Rx'd Questran for him.  Does he need new Rx sent? Follow-up by: Dutch Quint RN,  May 17, 2010 12:52 PM  Additional Follow-up for Phone Call Additional follow up Details #1::        It was written, but does not look like I was successful in actually sending it.  Let him know I will go ahead and send--sorry for the delay. Let him know he should take this separately from other meds as it can affect absorption of some meds. Additional Follow-up by: Julieanne Manson MD,  May 20, 2010 8:26 AM    Additional Follow-up for Phone Call Additional follow up Details #2::    Pt. notified of Rx and Aidan Moten's instructions - verbalized understanding.  Dutch Quint RN  May 20, 2010 9:52 AM   New/Updated Medications:     QUESTRAN 4 GM PACK (CHOLESTYRAMINE) 4 gm mixed in liquid daily Prescriptions: QUESTRAN 4 GM PACK (CHOLESTYRAMINE) 4 gm mixed in liquid daily  #1 month x 6   Entered and Authorized by:   Julieanne Manson MD   Signed by:   Julieanne Manson MD on 05/20/2010   Method used:   Faxed to ...       [facility] - [facility] (retail)       [address] [place], [place]  [address]       Ph: [phone]       Fax: [phone]   RxID:   [id number]

## 2010-10-02 NOTE — Assessment & Plan Note (Signed)
Summary: FU PER Lance Cook////KT   Vital Signs:  Patient profile:   50 year old male Weight:      242 pounds Temp:     98.0 degrees F Pulse rhythm:   regular Resp:     18 per minute BP sitting:   130 / 78  (right arm) Cuff size:   large  Vitals Entered By: Vesta Mixer CMA (February 07, 2010 8:56 AM) CC: f/u, veins seem to be doing better, finished antibx, saw vein doctor yesterday. Is Patient Diabetic? No Pain Assessment Patient in pain? yes       Does patient need assistance? Ambulation Normal   Primary Care Provider:  Julieanne Manson MD  CC:  f/u, veins seem to be doing better, finished antibx, and saw vein doctor yesterday.Marland Kitchen  History of Present Illness: 1.  Cellulitis with venous stasis ulcer of leg.  Much better.  Has been to [facility] and planning for sclerotherapy  based on what pt. describes.  Has not been to Wound Care.  Allergies (verified): 1)  ! Penicillin  Physical Exam  Extremities:  Chronic stasis changes again noted.  Two one centimeter ulcers in right lower extremity.  Minimal pinkness around lower ulcer.  No discharge from ulcers--quite dry now.  Nontender around both sites.   Impression & Recommendations:  Problem # 1:  CELLULITIS, LEG, RIGHT (ICD-682.6) continue antibiotics for residual infection--5 more days. His updated medication list for this problem includes:    Doxycycline Hyclate 100 Mg Tabs (Doxycycline hyclate) .Marland Kitchen... 1 tab by mouth two times a day for 10 days  Problem # 2:  ATRIAL FIBRILLATION, CHRONIC (ICD-427.31) Check protime His updated medication list for this problem includes:    Lanoxin 0.25 Mg Tabs (Digoxin) .Marland Kitchen... 1 by mouth once daily    Diltiazem Hcl Er Beads 120 Mg Xr24h-cap (Diltiazem hcl er beads) .Marland Kitchen... 1 by mouth once daily    Atenolol 100 Mg Tabs (Atenolol) .Marland Kitchen... 1 by mouth q am 1/2 q pm    Warfarin Sodium 5 Mg Tabs (Warfarin sodium) .Marland Kitchen... 1 1/2 tabs daily  Orders: T-Protime, Auto ([id number])  Complete Medication  List: 1)  K-99 595 Mg Caps (Potassium gluconate) .Marland Kitchen.. 1 by mouth two times a day 2)  Protonix 40 Mg Tbec (Pantoprazole sodium) .Marland Kitchen.. 1 by mouth once daily 3)  Ferrous Sulfate 325 (65 Fe) Mg Tabs (Ferrous sulfate) .Marland Kitchen.. 1 by mouth once daily 4)  Allopurinol 300 Mg Tabs (Allopurinol) .Marland Kitchen.. 1 by mouth once daily 5)  Lanoxin 0.25 Mg Tabs (Digoxin) .Marland Kitchen.. 1 by mouth once daily 6)  Diltiazem Hcl Er Beads 120 Mg Xr24h-cap (Diltiazem hcl er beads) .Marland Kitchen.. 1 by mouth once daily 7)  Furosemide 40 Mg Tabs (Furosemide) .... Take 1 tablet by mouth two times a day 8)  Atenolol 100 Mg Tabs (Atenolol) .Marland Kitchen.. 1 by mouth q am 1/2 q pm 9)  Colcrys 0.6 Mg Tabs (Colchicine) .Marland Kitchen.. 1 tab by mouth as needed for gouty flair 10)  Warfarin Sodium 5 Mg Tabs (Warfarin sodium) .Marland Kitchen.. 1 1/2 tabs daily 11)  Calcium-magnesium-zinc 750-375-15 Mg Tabs (Calcium-magnesium-zinc) .Marland Kitchen.. 1 by mouth two times a day as needed 12)  Vitamin C 1000 Mg Tabs (Ascorbic acid) .Marland Kitchen.. 1 by mouth once daily 13)  Vitamin E 400 Unit Caps (Vitamin e) .Marland Kitchen.. 1 by mouth once daily 14)  B Complex Tabs (B complex vitamins) .... Once daily 15)  Oxycodone-acetaminophen 5-325 Mg Tabs (Oxycodone-acetaminophen) .Marland Kitchen.. 1 tab by mouth every 6 hours as needed. 16)  Doxycycline  Hyclate 100 Mg Tabs (Doxycycline hyclate) .Marland Kitchen.. 1 tab by mouth two times a day for 10 days 17)  Niaspan 500 Mg Cr-tabs (Niacin (antihyperlipidemic)) .Marland Kitchen.. 1 tab by mouth at bedtime for 7 days, then increase to 2 tabs at bedtime  Patient Instructions: 1)  Follow up with Dr. Delrae Alfred in 4 months  Prescriptions: DOXYCYCLINE HYCLATE 100 MG TABS (DOXYCYCLINE HYCLATE) 1 tab by mouth two times a day for 10 days  #10 x 0   Entered and Authorized by:   Julieanne Manson MD   Signed by:   Julieanne Manson MD on 02/07/2010   Method used:   Faxed to ...       [facility] (retail)       [address] [place], [place]  [address]       Ph: [phone]       Fax:  [phone]   RxID:   [id number]

## 2010-10-02 NOTE — Miscellaneous (Signed)
Summary: Hospital discharge: 11/17/2009: cholecystectomy   Hospital Discharge  Date of admission: 11/11/2009  Date of discharge: 11/17/2009  Brief reason for admission/active problems: Patient was admitted for acute cholecystitis and has had lap chole done. He has tolerated procedure well, remained stable and afebrile, clean wound post op.  Followup needed: Patient will have to see his PCP in 2 weeks. We have attempted to schedule an appoinmtent for him but were [facility] Patient will have to go there Monday for PT/INR follow up. He was discharged on Lovenox 120 mg two times a day dosing and his INR on discharege was 1.34. Goal INR is 2.5-3.5. He was given enough lovenox to last him until seen in clinic at which point further management will be discussed. He was also discharged on coumadin 5 mg once daily. This dosing might need to be readjusted based on his PT/INR. Patinet also has an appointment with surgery on 11/27/2009 at 2:30pm. Their phone number [phone]. Please follow up on their recs.  Please also check cbc to check if Hg/Hct are at his baseline. Aspirin was stopped. Lasix was decreased from 40 mg two times a day to 40 mg once daily dosing. You may titrate the dose up as indicated provided that electrolytes are WNL.   The medication and problem lists have been updated.  Please see the dictated discharge summary for details.   Prescriptions: LOVENOX 120 MG/0.8ML SOLN (ENOXAPARIN SODIUM) Inject under the skin twice daily  #6 x 0   Entered and Authorized by:   Mliss Sax MD   Signed by:   Mliss Sax MD on 11/17/2009   Method used:   Print then Give to Patient   RxID:   [id number] COUMADIN 5 MG TABS (WARFARIN SODIUM) Take 1 tablet by mouth once a day  #30 x 0   Entered and Authorized by:   Mliss Sax MD   Signed by:   Mliss Sax MD on 11/17/2009   Method used:   Print then Give to Patient   RxID:    [id number] FUROSEMIDE 40 MG TABS (FUROSEMIDE) Take 1 tablet by mouth once a day  #30 x 3   Entered and Authorized by:   Mliss Sax MD   Signed by:   Mliss Sax MD on 11/17/2009   Method used:   Print then Give to Patient   RxID:   [id number] LOVENOX 120 MG/0.8ML SOLN (ENOXAPARIN SODIUM) Inject under the skin twice daily  #6 x 0   Entered and Authorized by:   Mliss Sax MD   Signed by:   Mliss Sax MD on 11/17/2009   Method used:   Print then Give to Patient   RxID:   [id number] COUMADIN 5 MG TABS (WARFARIN SODIUM) Take 1 tablet by mouth once a day  #30 x 0   Entered and Authorized by:   Mliss Sax MD   Signed by:   Mliss Sax MD on 11/17/2009   Method used:   Print then Give to Patient   RxID:   [id number] FUROSEMIDE 40 MG TABS (FUROSEMIDE) Take 1 tablet by mouth once a day  #30 x 3   Entered and Authorized by:   Mliss Sax MD   Signed by:   Mliss Sax MD on 11/17/2009   Method used:   Print then Give to Patient   RxID:   [id number]    Patient Instructions: 1)  [facility] call [facility] to schedule a follow  up appointment within next 2 weeks.  2)  [facility] have to come [facility] AM 11/19/2009 so that your PT/INR can be checked. 3)  I have attached brochure on Coumadin therapy so please review it as there are certain types of foods that you should avoid. 4)  You may shower but insure that you do no heavy lifting for at least 4 weeks due to your recent surgery. 5)  If you experience any bleeding, fever or chills please call [phone] which is the phone number to your surgeon's clinic. 6)  You have an appointment with surgery doctor 11-27-2009 at 2:30pm and again their phone number is [phone]. 7)  Please check your blood pressure regularly, if it is >170 [facility].  8)  CBC w/ Diff prior to visit, ICD-9:    Complete Medication List: 1)  K-99 595 Mg Caps (Potassium gluconate) .Marland Kitchen.. 1 by mouth two times a  day 2)  Protonix 40 Mg Tbec (Pantoprazole sodium) .Marland Kitchen.. 1 by mouth once daily 3)  Ferrous Sulfate 325 (65 Fe) Mg Tabs (Ferrous sulfate) .Marland Kitchen.. 1 by mouth once daily 4)  Allopurinol 300 Mg Tabs (Allopurinol) .Marland Kitchen.. 1 by mouth once daily 5)  Lanoxin 0.25 Mg Tabs (Digoxin) .Marland Kitchen.. 1 by mouth once daily 6)  Diltiazem Hcl Er Beads 120 Mg Xr24h-cap (Diltiazem hcl er beads) .Marland Kitchen.. 1 by mouth once daily 7)  Furosemide 40 Mg Tabs (Furosemide) .... Take 1 tablet by mouth once a day 8)  Atenolol 100 Mg Tabs (Atenolol) .Marland Kitchen.. 1 by mouth q am 1/2 q pm 9)  Colcrys 0.6 Mg Tabs (Colchicine) .Marland Kitchen.. 1 tab by mouth as needed for gouty flair 10)  Coumadin 5 Mg Tabs (Warfarin sodium) .... Take 1 tablet by mouth once a day 11)  Calcium-magnesium-zinc 750-375-15 Mg Tabs (Calcium-magnesium-zinc) .... 3 by mouth once daily 12)  Vitamin C 1000 Mg Tabs (Ascorbic acid) .Marland Kitchen.. 1 by mouth once daily 13)  Vitamin E 400 Unit Caps (Vitamin e) .Marland Kitchen.. 1 by mouth once daily 14)  B Complex Tabs (B complex vitamins) .... Once daily 15)  Lovenox 120 Mg/0.52ml Soln (Enoxaparin sodium) .... Inject under the skin twice daily

## 2010-10-02 NOTE — Letter (Signed)
Summary: *Referral Letter  [facility]  [address] [place], [place] [address]   Phone: [phone]  Fax: [phone]    01/25/2010  Thank you in advance for agreeing to see my patient:  Lance Cook [address] [place], [place]  [address]  Phone: [phone]  Reason for Referral: Mr. Hippler has developed venous stasis ulcers that bleed easily as he is also on Warfarin for other health concerns.  Today, he has developed cellulitis surrounding the area.  I am having him see [facility] to see if something can be done for he varicosities as well.  Procedures Requested: Care of venous ulcers that appear to be advancing.  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) 7)  LEG PAIN, LEFT (ICD-729.5) 8)  VARICOSE VEINS, LOWER EXTREMITIES (ICD-454.9) 9)  PERIPHERAL EDEMA (ICD-782.3) 10)  MYOCARDIAL INFARCTION, HX OF (ICD-412) 11)  DIABETES MELLITUS, TYPE II (ICD-250.00) 12)  INTERNAL HEMORRHOIDS (ICD-455.0) 13)  ANEMIA, IRON DEFICIENCY (ICD-280.9) 14)  AORTIC VALVE REPLACEMENT, HX OF (ICD-V43.3) 15)  COUMADIN THERAPY (ICD-V58.61) 16)  ATRIAL FIBRILLATION, CHRONIC (ICD-427.31) 17)  GOUT (ICD-274.9)   Current Medications: 1)  K-99 595 MG CAPS (POTASSIUM GLUCONATE) 1 by mouth two times a day 2)  PROTONIX 40 MG TBEC (PANTOPRAZOLE SODIUM) 1 by mouth once daily 3)  FERROUS SULFATE 325 (65 FE) MG TABS (FERROUS SULFATE) 1 by mouth once daily 4)  ALLOPURINOL 300 MG TABS (ALLOPURINOL) 1 by mouth once daily 5)  LANOXIN 0.25 MG TABS (DIGOXIN) 1 by mouth once daily 6)  DILTIAZEM HCL ER BEADS 120 MG XR24H-CAP (DILTIAZEM HCL ER BEADS) 1 by mouth once daily 7)  FUROSEMIDE 40 MG TABS (FUROSEMIDE) Take 1 tablet by mouth two times a day 8)  ATENOLOL 100 MG  TABS (ATENOLOL) 1 by mouth q am 1/2 q pm 9)  COLCRYS 0.6 MG TABS (COLCHICINE) 1 tab by mouth as needed for gouty flair 10)  WARFARIN SODIUM 5 MG TABS (WARFARIN SODIUM) 1 1/2 tabs daily 11)  CALCIUM-MAGNESIUM-ZINC 750-375-15 MG TABS (CALCIUM-MAGNESIUM-ZINC) 1 by mouth two times a day as needed 12)  VITAMIN C 1000 MG TABS (ASCORBIC ACID) 1 by mouth once daily 13)  VITAMIN E 400 UNIT CAPS (VITAMIN E) 1 by mouth once daily 14)  B COMPLEX  TABS (B COMPLEX VITAMINS) once daily 15)  OXYCODONE-ACETAMINOPHEN 5-325 MG TABS (OXYCODONE-ACETAMINOPHEN) 1 tab by mouth every 6 hours as needed. 16)  DOXYCYCLINE HYCLATE 100 MG TABS (DOXYCYCLINE HYCLATE) 1 tab by mouth two times a day for 10 days   Past Medical History: 1)   1. Status post mechanical aortic valve replaced at 50 years old  2)       secondary to a traumatic incident requiring a saphenous vein graft  3)       bypass to the right coronary artery.  4)   2. Hypertension.  5)   3. Peripheral vascular disease by his account.  6)   4. Obesity.  7)   5. Atrial fibrillation currently on Coumadin therapy.  8)   6. Varicose veins 9)   7. Morbid obesity 10)          Thank you again for agreeing to see our patient; please contact us if you have any further questions or need additional information.  Sincerely,  Julieanne Manson MD
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
## 2010-10-02 NOTE — Progress Notes (Signed)
Summary: Requesting provider call him back  Phone Note Call from Patient   Summary of Call: The pt is requesting the provider call him back because he has some questions about his leg. Mahum Betten MD Initial call taken by: Manon Hilding,  Jan 22, 2010 8:58 AM  Follow-up for Phone Call        Spoke with pt. -- states ulcer on foot appears to be getting larger, also there is a second ulcer.  Treating with Neosporin and Lidocaine, covered by airtight dressing, every 2-3 days.  Looks like it's healing, but bleeds copiously when dressing changed or when standing.  States that wound opened the other day and he "lost like a pint of blood."  Wounds are very painful and "fiery", wants to know what he can use for pain relief and is an antibiotic indicated?  Has appt. on 02/04/10 with vascular doctor. Follow-up by: Dutch Quint RN,  Jan 24, 2010 12:54 PM  Additional Follow-up for Phone Call Additional follow up Details #1::        Spoke with provider -- pt. to be seen.  Spoke with pt. and agrees to come in 01/25/10.  Additional Follow-up by: Dutch Quint RN,  Jan 24, 2010 2:05 PM

## 2010-10-02 NOTE — Assessment & Plan Note (Signed)
Summary: eph   Visit Type:  Follow-up Primary Provider:  Julieanne Manson MD  CC:  AVR and Afib.  History of Present Illness: The patient presents for followup of the above. He was recently in the hospital and had cholecystectomy. He had no problems associated with this. He did go home on Lovenox injections while his Coumadin became therapeutic. This is being followed by his primary physician. He has been on a higher dose of Lasix since earlier in the year when he came to the hospital with volume overload. He says this is making him weak and causing him to stay up throughout the night. He has leg cramping. He does have less lower extremity edema however. He has been weak and has not been doing much activity since his surgery. He has had some mild incisional discomfort. He's not had any other chest discomfort that he had on previous recent hospitalizations. He's had no new shortness of breath, PND or orthopnea.  Current Medications (verified): 1)  K-99 595 Mg Caps (Potassium Gluconate) .Marland Kitchen.. 1 By Mouth Two Times A Day 2)  Protonix 40 Mg Tbec (Pantoprazole Sodium) .Marland Kitchen.. 1 By Mouth Once Daily 3)  Ferrous Sulfate 325 (65 Fe) Mg Tabs (Ferrous Sulfate) .Marland Kitchen.. 1 By Mouth Once Daily 4)  Allopurinol 300 Mg Tabs (Allopurinol) .Marland Kitchen.. 1 By Mouth Once Daily 5)  Lanoxin 0.25 Mg Tabs (Digoxin) .Marland Kitchen.. 1 By Mouth Once Daily 6)  Diltiazem Hcl Er Beads 120 Mg Xr24h-Cap (Diltiazem Hcl Er Beads) .Marland Kitchen.. 1 By Mouth Once Daily 7)  Furosemide 40 Mg Tabs (Furosemide) .... Take 1 Tablet By Mouth Two Times A Day 8)  Atenolol 100 Mg Tabs (Atenolol) .Marland Kitchen.. 1 By Mouth Q Am 1/2 Q Pm 9)  Colcrys 0.6 Mg Tabs (Colchicine) .Marland Kitchen.. 1 Tab By Mouth As Needed For Gouty Flair 10)  Coumadin 5 Mg Tabs (Warfarin Sodium) .... Take 1 Tablet By Mouth Once A Day 11)  Calcium-Magnesium-Zinc 750-375-15 Mg Tabs (Calcium-Magnesium-Zinc) .Marland Kitchen.. 1 By Mouth Two Times A Day As Needed 12)  Vitamin C 1000 Mg Tabs (Ascorbic Acid) .Marland Kitchen.. 1 By Mouth Once Daily 13)   Vitamin E 400 Unit Caps (Vitamin E) .Marland Kitchen.. 1 By Mouth Once Daily 14)  B Complex  Tabs (B Complex Vitamins) .... Once Daily  Allergies (verified): 1)  ! Penicillin  Past History:  Past Medical History:  1. Status post mechanical aortic valve replaced at 50 years old       secondary to a traumatic incident requiring a saphenous vein graft       bypass to the right coronary artery.   2. Hypertension.   3. Peripheral vascular disease by his account.   4. Obesity.   5. Atrial fibrillation currently on Coumadin therapy.   6. Varicose veins  7. Morbid obesity     Past Surgical History: Reviewed history from 11/08/2009 and no changes required. Status post mechanical aortic valve   Review of Systems       As stated in the HPI and negative for all other systems.   Vital Signs:  Patient profile:   50 year old male Height:      60 inches Weight:      249 pounds BMI:     48.81 Pulse rate:   98 / minute Resp:     16 per minute BP sitting:   122 / 88  (right arm)  Vitals Entered By: Marrion Coy, CNA (November 30, 2009 10:37 AM)  Physical Exam  General:  Well developed,  well nourished, in no acute distress. Head:  normocephalic and atraumatic Eyes:  PERRLA/EOM intact; conjunctiva and lids normal. Neck:  Neck supple, no JVD. No masses, thyromegaly or abnormal cervical nodes. Chest Wall:  well healed surgical scar Lungs:  Clear bilaterally to auscultation and percussion. Abdomen:  Bowel sounds positive; abdomen soft and non-tender without masses, organomegaly, or hernias noted. No hepatosplenomegaly, obese Msk:  Back normal, normal gait. Muscle strength and tone normal. Extremities:  severe lower varicosities Neurologic:  Alert and oriented x 3. Skin:  Intact without lesions or rashes. Psych:  Normal affect.   Detailed Cardiovascular Exam  Neck    Carotids: Carotids full and equal bilaterally without bruits.      Neck Veins: Normal, no JVD.    Heart    Inspection: no  deformities or lifts noted.      Palpation: normal PMI with no thrills palpable.      Auscultation: S1 within normal limits, mechanical S2, irregular rhythm, 3/6 apical systolic murmur radiating up the aortic outflow tract, no diastolic murmurs  Vascular    Abdominal Aorta: no palpable masses, pulsations, or audible bruits.      Femoral Pulses: normal femoral pulses bilaterally.      Pedal Pulses: normal pedal pulses bilaterally.      Radial Pulses: normal radial pulses bilaterally.     EKG  Procedure date:  11/30/2009  Findings:      atrial fibrillation, left axis deviation, poor anterior R-wave progression, could not exclude old anteroseptal infarct, lateral T-wave inversion  EKG  Procedure date:  11/30/2009  Comments:      this EKG was compared with previous and T-wave inversions were present on previous  Impression & Recommendations:  Problem # 1:  PERIPHERAL EDEMA (ICD-782.3) We had a long discussion about this. He feels washed out on 80 mg of Lasix daily. He will go down to 40 but we discussed at great length, in great detail, the need for close followup of weights, symptoms such as shortness of breath or edema and p.r.n. dosing of his Lasix. I will check a basic metabolic profile today. We may need to adjust his potassium as well. They understand the need for keeping the feet elevated, salt and fluid restriction. Orders: TLB-BMP (Basic Metabolic Panel-BMET) (80048-METABOL)  Problem # 2:  AORTIC VALVE REPLACEMENT, HX OF (ICD-V43.3) He had an echocardiogram in February and his valve was functioning normally. 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 This has been stable over serial exams. He will continue with Coumadin therapy.  Problem # 3:  ATRIAL FIBRILLATION, CHRONIC (ICD-427.31) He tolerates this with rate control and anticoagulation. Orders: EKG w/ Interpretation (93000)  Patient Instructions: 1)  Your physician  recommends that you schedule a follow-up appointment in:  6 months 2)  Your physician recommends that you have lab work today: bmp 3)  Your physician recommends that you continue on your current medications as directed. Please refer to the Current Medication list given to you today.

## 2010-10-03 NOTE — Progress Notes (Signed)
  Phone Note Outgoing Call   Summary of Call: Called pt. and again emphasized taking Iron--in fact to take two times a day.   Discussed last hgb a bit better than in the spring. Please schedule him for repeat CBC for anemia in 2 months along with FLP Please let him know also that it is very important he use his Bipap/CPAP regularly--saw a note on him recently that he admitted to not using it regularly.  His leg edema will likely  worsen if he does not use regularly Initial call taken by: Julieanne Manson MD,  August 21, 2010 10:53 AM  Follow-up for Phone Call        Left message on answering machine for pt to call back...Marland KitchenMarland KitchenArmenia Cook  August 22, 2010 10:37 AM   pt wants to schedule a sleep study...  pt says he does try to use his cpap more..pt is aware of lab results.and is sscheduled  Lance Cook  August 23, 2010 10:03 AM

## 2010-10-03 NOTE — Progress Notes (Signed)
  Phone Note From Pharmacy   Caller: [facility] [address] [phone]* Summary of Call: Request to change brand of Warfarin--please call pharmacy and let them know okay to do so.  Please then call the pt. and schedule a repeat protime 1 week after starting the new brand. Please also see previous phone note and discuss with pt. Initial call taken by: Julieanne Manson MD,  August 27, 2010 6:41 PM  Follow-up for Phone Call        PATIEN CALL TO SEE IF IS READY [phone] PATIEN WILL CALL TO SCHEDULE THE APP Follow-up by: Domenic Polite,  August 28, 2010 10:27 AM  Additional Follow-up for Phone Call Additional follow up Details #1::        called pharmacy and the med is changed.Marland KitchenMarland KitchenMarland KitchenArmenia [name]  August 28, 2010 11:17 AM Left message on answering machine for pt to call back.Marland KitchenMarland KitchenMarland KitchenArmenia [name]  August 28, 2010 11:40 AM   Left message on answering machine for pt to call back.Marland KitchenMarland KitchenMarland KitchenArmenia [name]  August 30, 2010 4:56 PM     Additional Follow-up for Phone Call Additional follow up Details #2::    Left message on answering machine for pt to call back.Marland KitchenMarland KitchenMarland KitchenMarland Kitchen will mail letter....Marland KitchenMarland KitchenArmenia [name]  September 05, 2010 9:12 AM

## 2010-10-03 NOTE — Progress Notes (Signed)
Summary: Needs pain med refills  Phone Note Call from Patient   Summary of Call: PT CALLED NEEDS  HYDROCODONE REFILLED/IN  ALOT OF PAIN/APPT FOR REHAB IS NOT UNTIL  DEC 19TH/COULD YOU PLEASE REFILL PHONE # [phone] Initial call taken by: Arta Bruce,  August 15, 2010 10:14 AM  Follow-up for Phone Call        Is down to 2 or 3 pills left -- referral to therapy was just made for 08/19/10.  Advised that Rx was written 07/23/10.  States in agony and needs refill.  Needs enough to get him through therapy -- is doing everything to avoid taking meds.  If unable to get pain meds refilled, wants Dr. Delrae Alfred to please call him. Follow-up by: Dutch Quint RN,  August 15, 2010 11:00 AM  Additional Follow-up for Phone Call Additional follow up Details #1::        Please find out where he would like faxed Make sure he comes in for repeat protime on or around 08/29/10 please. Protime remains in good range Additional Follow-up by: Julieanne Manson MD,  August 16, 2010 6:05 AM  New Problems: AORTIC VALVE REPLACEMENT, HX OF (ICD-V43.3)   Additional Follow-up for Phone Call Additional follow up Details #2::    Called [phone], lm. Need to find out what pharmacy he uses and sched. him a lab appt. for protime.  .... Hale Drone CMA  August 16, 2010 9:14 AM    Left message on answering machine for pt to call back.Marland KitchenMarland KitchenArmenia [name]  August 20, 2010 10:17 AM   pt says he will come pick up script today.Marland KitchenMarland KitchenArmenia [name]  August 20, 2010 10:27 AM   New Problems: AORTIC VALVE REPLACEMENT, HX OF (ICD-V43.3) New/Updated Medications: WARFARIN SODIUM 5 MG TABS (WARFARIN SODIUM) 1 1/2 tabs daily Prescriptions: HYDROCODONE-ACETAMINOPHEN 5-500 MG TABS (HYDROCODONE-ACETAMINOPHEN) 1 tab by mouth every 4 hours as needed for severe pain  #60 x 0   Entered and Authorized by:   Julieanne Manson MD   Signed by:   Julieanne Manson MD on 08/16/2010   Method used:   Printed then faxed  to ...       [facility] (retail)       [address] [place], [place]  [address]       Ph: [phone]       Fax: [phone]   RxID:   [id number]

## 2010-10-03 NOTE — Letter (Signed)
Summary: MED/SOLUTIONS  MED/SOLUTIONS   Imported By: Arta Bruce 09/23/2010 13:52:05  _____________________________________________________________________  External Attachment:    Type:   Image     Comment:   External Document

## 2010-10-03 NOTE — Miscellaneous (Signed)
Summary: MRI order  Clinical Lists Changes  Radiology called about MRI order - needs with and without contrast Pt is going back there today for the study.  Orders: Added new Test order of MRI with & without Contrast (MRI w&w/o Contrast) - Signed Added new Test order of T-BUN ([id number]) - Signed Added new Test order of T-Creatine [phone]) - Signed

## 2010-10-03 NOTE — Assessment & Plan Note (Signed)
Summary: sign pain contract, urine drug screen // tl  Nurse Visit   Primary Care Provider:  Julieanne Manson MD   History of Present Illness: Here to sign pain contract and have urine drug screen done - contract and UDS completed.   Complete Medication List: 1)  K-99 595 Mg Caps (Potassium gluconate) .Marland Kitchen.. 1 by mouth two times a day 2)  Nexium 40 Mg Cpdr (Esomeprazole magnesium) .Marland Kitchen.. 1 cap by mouth on empty stomach daily 3)  Ferrous Sulfate 325 (65 Fe) Mg Tabs (Ferrous sulfate) .Marland Kitchen.. 1 by mouth two times a day 4)  Allopurinol 300 Mg Tabs (Allopurinol) .Marland Kitchen.. 1 by mouth once daily 5)  Lanoxin 0.25 Mg Tabs (Digoxin) .Marland Kitchen.. 1 by mouth once daily 6)  Diltiazem Hcl Er Beads 120 Mg Xr24h-cap (Diltiazem hcl er beads) .Marland Kitchen.. 1 by mouth once daily 7)  Furosemide 40 Mg Tabs (Furosemide) .... Take 1 tablet by mouth two times a day 8)  Atenolol 100 Mg Tabs (Atenolol) .Marland Kitchen.. 1 by mouth q am 1/2 q pm 9)  Colcrys 0.6 Mg Tabs (Colchicine) .Marland Kitchen.. 1 tab by mouth as needed for gouty flair 10)  Coumadin 5 Mg Tabs (Warfarin sodium) .Marland Kitchen.. 1 1/2 tabs by mouth daily 11)  Calcium-magnesium-zinc 750-375-15 Mg Tabs (Calcium-magnesium-zinc) .Marland Kitchen.. 1 by mouth two times a day as needed 12)  Vitamin C 1000 Mg Tabs (Ascorbic acid) .Marland Kitchen.. 1 by mouth once daily 13)  Vitamin E 400 Unit Caps (Vitamin e) .Marland Kitchen.. 1 by mouth once daily 14)  B Complex Tabs (B complex vitamins) .... Once daily 15)  Niaspan 500 Mg Cr-tabs (Niacin (antihyperlipidemic)) .Marland Kitchen.. 1 tab by mouth at bedtime for 7 days, then increase to 2 tabs at bedtime 16)  Questran 4 Gm Pack (Cholestyramine) .... 4 gm mixed in liquid daily 17)  Cialis 20 Mg Tabs (Tadalafil) .... Dr. kimbrough--multiple samples. 18)  Hydrocodone-acetaminophen 5-500 Mg Tabs (Hydrocodone-acetaminophen) .Marland Kitchen.. 1 tab by mouth every 4 hours as needed for severe pain  Other Orders: T-Drug Screen-Urine, ea (mullti) [phone])   Allergies: 1)  ! Penicillin  Orders Added: 1)  T-Drug Screen-Urine, ea  (mullti) [[id number]]  Appended Document: sign pain contract, urine drug screen // tl

## 2010-10-03 NOTE — Miscellaneous (Signed)
Summary: Rehab Report//INITIAL SUMMARY  Rehab Report//INITIAL SUMMARY   Imported By: Arta Bruce 08/21/2010 10:22:37  _____________________________________________________________________  External Attachment:    Type:   Image     Comment:   External Document

## 2010-10-03 NOTE — Letter (Signed)
Summary: *HSN Results Follow up  [facility]  [name] [place], [place] [address]   Phone: [phone]  Fax: [phone]      09/05/2010   QUINTERIOUS WALRA[facility] [address] [place], [place]  [address]   Dear  Mr. JARIS KOHLES,                            ____S.Drinkard,FNP   ____D. Gore,FNP       ____B. McPherson,MD   ____V. Rankins,MD    ____E. Hades Mathew,MD    ____N. Daphine Deutscher, FNP  ____D. Reche Dixon, MD    ____K. Philipp Deputy, MD    ____Other     This letter is to inform you that your recent test(s):  _______Pap Smear    _______Lab Test     _______X-ray    _______ is within acceptable limits  _______ requires a medication change  _______ requires a follow-up lab visit  _______ requires a follow-up visit with your provider   Comments:  We have been trying to reach you.  Please give the office a call.       _________________________________________________________ If you have any questions, please contact our office                     Sincerely,  Arm[place] [name] [facility]

## 2010-10-03 NOTE — Progress Notes (Signed)
Summary: Pt. query re possible test  Phone Note Outgoing Call   Summary of Call: Pt. would like to know if there is some type of study, like a holter-monitor, that records what happens during movement (like an MRI study, but with motion).  Concerned that MRI will not reflect something that is causing his back/leg pain.  I advised that I was not familiar with one. Initial call taken by: Dutch Quint RN,  September 23, 2010 4:43 PM  Follow-up for Phone Call        No--if there is, I suspect itis only in the research sector Follow-up by: Julieanne Manson MD,  September 25, 2010 1:52 PM  Additional Follow-up for Phone Call Additional follow up Details #1::        Pt. notified.  Dutch Quint RN  September 25, 2010 2:22 PM

## 2010-10-03 NOTE — Progress Notes (Signed)
Summary: Refill / F/U with PT  Phone Note Call from Patient Call back at [facility] Phone [phone] Call back at (cell) [phone]   Reason for Call: Refill Medication Summary of Call: Lance Cook pt. Lance Cook IS CALLING FOR ANOTHER REFILL ON HIS HYDROCODONE,  AND HE SAYS THAT HE WANTS YOU TO CALL HIM, BECAUSE SOMETHING HAS TO BE DONE ABOUT HIS PAIN, HIS PHYSICAL THERAPY, HE CAN PNLY DO 3 TIMES A YEAR. Initial call taken by: Leodis Rains,  September 12, 2010 9:54 AM  Follow-up for Phone Call        Last filled 08/06/10 # 60. Gaylyn Cheers RN  September 12, 2010 1:00 PM   Additional Follow-up for Phone Call Additional follow up Details #1::        He needs to be able to speak with support staff.  I need more infor regarding the PT.  Has he not been receiving all this time? Additional Follow-up by: Julieanne Manson MD,  September 13, 2010 6:20 PM    Additional Follow-up for Phone Call Additional follow up Details #2::    Left message on answer machine for pt. to return call. Gaylyn Cheers RN  September 17, 2010 9:49 AM Medicaid only allows 3 visits/yr. Had one visit 2012, PT wants to wait until TENS unit comes in prior to any additional visits which is scheduled for 09/29/10. Therapist thought the MD may want to considera muscle relaxant. Back pain radiates down leg, knee occasionally buckles due to pain. Pain right now 15/10. Unable to lay down, sit down or stand without pain. Request a change from Warafin to Coumadin because he now has medicaid. Gaylyn Cheers RN  September 17, 2010 10:35 AM Gaylyn Cheers RN  September 17, 2010 3:45 PM  May switch back to Coumadin--same dosing Schedule repeat protime in 1 week after switch Was the above info from PT or the patient? If from patient, would like the input from PT --could you call PT (see the scanned in initial evaluation to get the particular PT's name) and get her opinion?Julieanne Manson MD  September 17, 2010 5:52 PM   Coumadin changed -  sent to [facility] [address] per pt. request.  Wants to finish warfarin he has and then start Coumadin - will call office to schedule for f/u labs per provider's instructions.  Would like to know if he can have something stronger than Vicodin, or to use Vicodin in conjunction with muscle relaxant.  States his back is cracking with each motion, sending "electrical charges" down his back, creating imbalance in gait.  I will call PT to confirm same symptoms.  Dutch Quint RN  September 18, 2010 10:30 AM  Spoke with Eulis Foster, PT / [phone].  Pt. couldn't tolerate any exercises, including soft tissue work.  Elnita Maxwell states that pt. described pain as having "a thousand spasms" --  PT states pt. was "miserable" during PT visit, had pain everywhere.  PT has faxed for TENS unit for pt. at home.  States pt. feels strong and flexible when he doesn't have pain, "I'm already strong when I'm on pain pills" -- direct quote to PT.   Also PT never recommends medication, they refer patients back to PCP.  Follow-up by: Dutch Quint RN,  September 18, 2010 1:03 PM  Additional Follow-up for Phone Call Additional follow up Details #3:: Details for Additional Follow-up Action Taken: PT suggested that they discharge him currently, since he is not progressing and  he is unable to  do anything, then have him re-evaluated later in the year.  He is eligible to have one evaluation per year with 2 more visits available to him.  Would like to speak with pt. and suggest this to him, but would like your input first and a return call.   Dutch Quint RN  September 18, 2010 1:03 PM  Lets get him set up with an MRI of LS spine for lumbar radiculopathy symptoms.  His xrays did not show changes that would explain his extreme pain such that he cannot withstand therapy.  Let him know I will fill the hydrocodone for now--though he will need to come in and fill out a pain contract today or tomorrow.  I will fax the refill tomorrow if he has signed the  contract.  He will need to give [place] a urine specimen after signing the docmument--please send for Urine drug screen.  He will need to get the MRI done to continue with pain meds.  Please let MRI know he has a mechanical aortic valve.  Order written.  See also append for Protime  Julieanne Manson MD  September 19, 2010 10:20 AM   Left message with male @ home # for pt. to return call. Also Left message on cell # [phone] answer machine for pt. to return call. Gaylyn Cheers RN  September 19, 2010 2:46 PM  Pt. advised of all of provider's instructions from both notes.  Appt. 09/20/10 for him to complete pain contract, do urine drug screen and schedule MRI.  MRI needs to know if he has card to permit MRI with his mech. valve.  Left message on voicemail for pt. to return call.  Dutch Quint RN  September 19, 2010 4:12 PM  Pt. changed appt. to Monday.  Call into Dr. Antoine Poche for clearance for MRI, MRI scheduling states due to valve, may need clearance, but not sure.  Pt. notified of appt. for MRI on 09/23/10.  Dutch Quint RN  September 20, 2010 4:42 PM  Pt. states MRI done this AM.  Completed pain contract & UDS.  Rx for pain med faxed to [facility].  Says he'll start Coumadin tonight & call to schedule lab for PT. Additional Follow-up by: Dutch Quint RN,  September 23, 2010 2:51 PM  New/Updated Medications: COUMADIN 5 MG TABS (WARFARIN SODIUM) 1 1/2 tabs by mouth daily Prescriptions: HYDROCODONE-ACETAMINOPHEN 5-500 MG TABS (HYDROCODONE-ACETAMINOPHEN) 1 tab by mouth every 4 hours as needed for severe pain  #60 x 0   Entered and Authorized by:   Julieanne Manson MD   Signed by:   Julieanne Manson MD on 09/19/2010   Method used:   Printed then faxed to ...       [facility] [id number] (retail)       [address] [place], [place]  [address]       Ph: [phone]       Fax: [phone]   RxID:   [id number] COUMADIN 5 MG TABS (WARFARIN SODIUM) 1 1/2 tabs by mouth  daily  #45 x 0   Entered by:   Dutch Quint RN   Authorized by:   Julieanne Manson MD   Signed by:   Dutch Quint RN on 09/18/2010   Method used:   Electronically to        [facility] [address] [facility] [id number] (retail)       [address]       [place], [place]  [address]       Ph: [phone]       Fax: [phone]   RxID:   [id number]

## 2010-10-03 NOTE — Letter (Signed)
Summary: AGREEMENT FOR CONTROLLED SUBSTANCE  AGREEMENT FOR CONTROLLED SUBSTANCE   Imported By: Arta Bruce 09/23/2010 14:48:33  _____________________________________________________________________  External Attachment:    Type:   Image     Comment:   External Document

## 2010-10-03 NOTE — Progress Notes (Addendum)
Summary: clearance for mir on back on 1/23 ask [facility]  Phone Note From Other Clinic   Caller: teresa from [facility] [phone] Request: Talk with Nurse Summary of Call: pt having mri of back on 1/23. can pt be clear for this. secondary to mechanical  valve.  Initial call taken by: Lorne Skeens,  September 20, 2010 3:39 PM  Follow-up for Phone Call        I dont know if pt can have a MRI with a mechanical valve - will review with Dr Antoine Poche on Monday.  Attempted to call [facility] back to let them know I will check with MD but voicemail was taking too long trying to get someone on the phone. Follow-up by: Charolotte Capuchin, RN,  September 20, 2010 4:00 PM  Additional Follow-up for Phone Call Additional follow up Details #1::        For the most part MRI with mechanical valves is safe.  However, I don't think that I know exactly what kind of valve Mr. Froh has.  We need this information before he should have the MRI.  Also, we need to let the radiologist know as they would typically want to use a low Tesla scan. Additional Follow-up by: Rollene Rotunda, MD, [facility],  September 22, 2010 9:09 PM    Additional Follow-up for Phone Call Additional follow up Details #2::    Left messge for pt to call with the type of valve that was placed.  Can not find in the records with have  Avie Arenas, RN

## 2010-10-03 NOTE — Assessment & Plan Note (Signed)
Summary: back pain////kt   Vital Signs:  Patient profile:   50 year old male Weight:      258 pounds BMI:     50.57 Temp:     98.8 degrees F oral Pulse rate:   78 / minute Pulse rhythm:   irregular Resp:     26 per minute BP sitting:   140 / 82  (left arm) Cuff size:   regular  Vitals Entered By: Hale Drone CMA (July 23, 2010 3:39 PM) CC: Back pain. Pt. can't walk w/o some pain and discomfort.  Is Patient Diabetic? Yes Pain Assessment Patient in pain? yes     Location: back Intensity: 10 Type: sharp Onset of pain  With activity CBG Result 159 CBG Device ID B Non fasting  Does patient need assistance? Functional Status Self care Ambulation Normal   Primary Care Provider:  Julieanne Manson MD  CC:  Back pain. Pt. can't walk w/o some pain and discomfort. Marland Kitchen  History of Present Illness: 1.  Anticoagulation:  protime was up a bit, but no change in med.  Pt. was concerned the iron he takes intermittently may be interfering.  Has not taken recently regardless.  Hgb was 10.0 in March when in hospital.  2.  Low back pain:  Conitnues to worsen.  To point where he cannot walk at times.  Mild degenerative changes L3-S1.  Is in Cardiac rehab, which seems to make the back pain worse.  Had PT many years ago for this (20 years).  Mainly pain on right low back.  When gets up from lying down, bilateral legs buckle.  Cannot get comfortable lying on back or rolling over.  Interestingly, has pain at times radiating down his left leg, not the right.  Pt. states the pain has become much worse since starting Cardio rehab.    3.  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  Removed testicle.  No hx of undescended testicle.  Pt. states had adjuvant radiation.  No chemotherapy.  Was followed for 10 years.  No recurrence.  Recommended to be followed yearly.  Has not followed up as recommended.  Wants Urology appt.    Current Medications (verified): 1)   K-99 595 Mg Caps (Potassium Gluconate) .Marland Kitchen.. 1 By Mouth Two Times A Day 2)  Nexium 40 Mg Cpdr (Esomeprazole Magnesium) .Marland Kitchen.. 1 Cap By Mouth On Empty Stomach Daily 3)  Ferrous Sulfate 325 (65 Fe) Mg Tabs (Ferrous Sulfate) .Marland Kitchen.. 1 By Mouth Once Daily 4)  Allopurinol 300 Mg Tabs (Allopurinol) .Marland Kitchen.. 1 By Mouth Once Daily 5)  Lanoxin 0.25 Mg Tabs (Digoxin) .Marland Kitchen.. 1 By Mouth Once Daily 6)  Diltiazem Hcl Er Beads 120 Mg Xr24h-Cap (Diltiazem Hcl Er Beads) .Marland Kitchen.. 1 By Mouth Once Daily 7)  Furosemide 40 Mg Tabs (Furosemide) .... Take 1 Tablet By Mouth Two Times A Day 8)  Atenolol 100 Mg Tabs (Atenolol) .Marland Kitchen.. 1 By Mouth Q Am 1/2 Q Pm 9)  Colcrys 0.6 Mg Tabs (Colchicine) .Marland Kitchen.. 1 Tab By Mouth As Needed For Gouty Flair 10)  Warfarin Sodium 5 Mg Tabs (Warfarin Sodium) .Marland Kitchen.. 1 1/2 Tabs Daily 11)  Calcium-Magnesium-Zinc 750-375-15 Mg Tabs (Calcium-Magnesium-Zinc) .Marland Kitchen.. 1 By Mouth Two Times A Day As Needed 12)  Vitamin C 1000 Mg Tabs (Ascorbic Acid) .Marland Kitchen.. 1 By Mouth Once Daily 13)  Vitamin E 400 Unit Caps (Vitamin E) .Marland Kitchen.. 1 By Mouth Once Daily 14)  B Complex  Tabs (B  Complex Vitamins) .... Once Daily 15)  Niaspan 500 Mg Cr-Tabs (Niacin (Antihyperlipidemic)) .Marland Kitchen.. 1 Tab By Mouth At Bedtime For 7 Days, Then Increase To 2 Tabs At Bedtime 16)  Questran 4 Gm Pack (Cholestyramine) .... 4 Gm Mixed in Liquid Daily  Allergies (verified): 1)  ! Penicillin  Physical Exam  Lungs:  Normal respiratory effort, chest expands symmetrically. Lungs are clear to auscultation, no crackles or wheezes. Heart:  Irreg, irreg.  Crisp valve noise Msk:  Tender over L/S spinous processes per pt--no specific level more tender.  No definite paraspinous muscle tenderness Neurologic:  alert & oriented X3, cranial nerves II-XII intact, and strength normal in all extremities.  DTRs equal, but difficult to elicit ankles bilaterally,  Patellar 2+/4 bilaterally.  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 that iron unlikely to be interferring with Coumadin--to take iron regularly. Protime not due until later in month  Problem # 4:  ANEMIA, IRON DEFICIENCY (ICD-280.9) As above His updated medication list for this problem includes:    Ferrous Sulfate 325 (65 Fe) Mg Tabs (Ferrous sulfate) .Marland Kitchen... 1 by mouth two times a day  Orders: T-CBC w/Diff ([address]109-60454)  Complete Medication List: 1)  K-99 595 Mg Caps (Potassium gluconate) .Marland Kitchen.. 1 by mouth two times a day 2)  Nexium 40 Mg Cpdr (Esomeprazole magnesium) .Marland Kitchen.. 1 cap by mouth on empty stomach daily 3)  Ferrous Sulfate 325 (65 Fe) Mg Tabs (Ferrous sulfate) .Marland Kitchen.. 1 by mouth two times a day 4)  Allopurinol 300 Mg Tabs (Allopurinol) .Marland Kitchen.. 1 by mouth once daily 5)  Lanoxin 0.25 Mg Tabs (Digoxin) .Marland Kitchen.. 1 by mouth once daily 6)  Diltiazem Hcl Er Beads 120 Mg Xr24h-cap (Diltiazem hcl er beads) .Marland Kitchen.. 1 by mouth once daily 7)  Furosemide 40 Mg Tabs (Furosemide) .... Take 1 tablet by mouth two times a day 8)  Atenolol 100 Mg Tabs (Atenolol) .Marland Kitchen.. 1 by mouth q am 1/2 q pm 9)  Colcrys 0.6 Mg Tabs (Colchicine) .Marland Kitchen.. 1 tab by mouth as needed for gouty flair 10)  Warfarin Sodium 5 Mg Tabs (Warfarin sodium) .Marland Kitchen.. 1 1/2 tabs daily 11)  Calcium-magnesium-zinc 750-375-15 Mg Tabs (Calcium-magnesium-zinc) .Marland Kitchen.. 1 by mouth two times a day as needed 12)  Vitamin C 1000 Mg Tabs (Ascorbic acid) .Marland Kitchen.. 1 by mouth once daily 13)  Vitamin E 400 Unit Caps (Vitamin e) .Marland Kitchen.. 1 by mouth once daily 14)  B Complex Tabs (B complex vitamins) .... Once daily 15)  Niaspan 500 Mg Cr-tabs (Niacin (antihyperlipidemic)) .Marland Kitchen.. 1 tab by mouth at bedtime for 7 days, then increase to 2 tabs at bedtime 16)  Questran 4 Gm Pack (Cholestyramine) .... 4 gm  mixed in liquid daily 17)  Cialis 20 Mg Tabs (Tadalafil) .... Dr. kimbrough--multiple samples. 18)  Hydrocodone-acetaminophen 5-500 Mg Tabs (Hydrocodone-acetaminophen) .Marland Kitchen.. 1 tab by mouth every 4 hours as needed for severe pain  Other Orders: Capillary Blood Glucose/CBG (09811) Flu Vaccine 62yrs + (91478) Admin 1st Vaccine (29562)  Patient Instructions: 1)  Follow up with Dr. Delrae Alfred in 3 months --back pain 2)  protime lab appt. in 2 weeks Prescriptions: FERROUS SULFATE 325 (65 FE) MG TABS (FERROUS SULFATE) 1 by mouth two times a day  #  60 x 11   Entered and Authorized by:   Julieanne Manson MD   Signed by:   Julieanne Manson MD on 08/21/2010   Method used:   Electronically to        [facility] [id number] (retail)       [address] [place], [place]  [address]       Ph: [phone]       Fax: [phone]   RxID:   [id number] HYDROCODONE-ACETAMINOPHEN 5-500 MG TABS (HYDROCODONE-ACETAMINOPHEN) 1 tab by mouth every 4 hours as needed for severe pain  #60 x 0   Entered and Authorized by:   Julieanne Manson MD   Signed by:   Julieanne Manson MD on 07/23/2010   Method used:   Print then Give to Patient   RxID:   [id number]    Orders Added: 1)  Capillary Blood Glucose/CBG [82948] 2)  T-CBC w/Diff [63875-64332] 3)  Est. Patient Level III [95188] 4)  Flu Vaccine 63yrs + [41660] 5)  Admin 1st Vaccine [90471] 6)  Urology Referral [Urology]   Immunizations Administered:  Influenza Vaccine # 1:    Vaccine Type: Fluvax 3+    Site: right deltoid    Mfr: GlaxoSmithKline    Dose: 0.5 ml    Route: IM    Given by: Levon Hedger    Exp. Date: 03/01/2011    Lot #: [id number]    VIS given: 03/26/10 version given July 23, 2010.  Flu Vaccine Consent Questions:    Do you have a history of severe allergic reactions to this vaccine? no    Any prior history of allergic reactions to egg and/or gelatin? no    Do you have a sensitivity  to the preservative Thimersol? no    Do you have a past history of Guillan-Barre Syndrome? no    Do you currently have an acute febrile illness? no    Have you ever had a severe reaction to latex? no    Vaccine information given and explained to patient? yes    ndc  [phone]  Immunizations Administered:  Influenza Vaccine # 1:    Vaccine Type: Fluvax 3+    Site: right deltoid    Mfr: GlaxoSmithKline    Dose: 0.5 ml    Route: IM    Given by: Levon Hedger    Exp. Date: 03/01/2011    Lot #: [id number]    VIS given: 03/26/10 version given July 23, 2010.

## 2010-10-03 NOTE — Progress Notes (Signed)
Summary: pt. request for sleep study  Phone Note Outgoing Call   Request: Send information Summary of Call: Please see previous phone note--No information obtained as to why he would want to obtain another sleep study when he is already being treated.  It also does not sound like he is using the CPAP all night every night as he should.  He can bring this up at his next visit.   Please obtain reasons why pts request testing, not just the request.  Julieanne Manson MD  August 25, 2010 4:10 PM   Follow-up for Phone Call        pt has two phone notes.... Lance Cook  August 30, 2010 5:02 PM    mailed pt letter after calling three times on other phone note.Marland KitchenMarland KitchenArmenia Cook  September 05, 2010 9:11 AM

## 2010-10-03 NOTE — Letter (Signed)
Summary: TEST ORDER FORM/MRI/APPT DATE & TIME  TEST ORDER FORM/MRI/APPT DATE & TIME   Imported By: Arta Bruce 09/23/2010 14:08:02  _____________________________________________________________________  External Attachment:    Type:   Image     Comment:   External Document

## 2010-10-07 ENCOUNTER — Encounter (INDEPENDENT_AMBULATORY_CARE_PROVIDER_SITE_OTHER): Payer: Self-pay | Admitting: Internal Medicine

## 2010-10-07 ENCOUNTER — Telehealth (INDEPENDENT_AMBULATORY_CARE_PROVIDER_SITE_OTHER): Payer: Self-pay | Admitting: Internal Medicine

## 2010-10-09 NOTE — Letter (Signed)
Summary: MED/SOLUTIONS//APPROVED  MED/SOLUTIONS//APPROVED   Imported By: Arta Bruce 10/03/2010 16:08:47  _____________________________________________________________________  External Attachment:    Type:   Image     Comment:   External Document

## 2010-10-09 NOTE — Letter (Signed)
Summary: *Referral Letter  [facility]  [address] [place], [place] [address]   Phone: [phone]  Fax: [phone]    10/01/2010  Thank you in advance for agreeing to see my patient:  Lance Cook [address] [place], [place]  [address]  Phone: [phone]  Reason for Referral: Left lumbar radiculopathy with significant L4-5 stenosis and what appears to be a small synovial cyst impinging on nerve root of L5.  Pt. with multiple health concerns, on chronic anticoagulation, but in significant pain.    Procedures Requested: Evaluation and recommendation for treatment.  I am wondering whether the cyst could be reduced with aspiration, via interventional radiology or otherwise and whether that would be a long term fix for this.  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) 12)  LEG PAIN, LEFT (ICD-729.5) 13)  VARICOSE VEINS, LOWER EXTREMITIES (ICD-454.9) 14)  PERIPHERAL EDEMA (ICD-782.3) 15)  MYOCARDIAL INFARCTION, HX OF (ICD-412) 16)  DIABETES MELLITUS, TYPE II (ICD-250.00) 17)  INTERNAL HEMORRHOIDS (ICD-455.0) 18)  ANEMIA, IRON DEFICIENCY (ICD-280.9) 19)  AORTIC VALVE REPLACEMENT, HX OF (ICD-V43.3) 20)  COUMADIN THERAPY (ICD-V58.61) 21)  ATRIAL FIBRILLATION, CHRONIC (ICD-427.31) 22)  GOUT (ICD-274.9)   Current Medications: 1)  K-99 595 MG CAPS (POTASSIUM GLUCONATE) 1 by mouth two times a day 2)  NEXIUM 40 MG CPDR (ESOMEPRAZOLE MAGNESIUM) 1 cap by mouth on empty stomach daily 3)  FERROUS SULFATE 325 (65  FE) MG TABS (FERROUS SULFATE) 1 by mouth two times a day 4)  ALLOPURINOL 300 MG TABS (ALLOPURINOL) 1 by mouth once daily 5)  LANOXIN 0.25 MG TABS (DIGOXIN) 1 by mouth once daily 6)  DILTIAZEM HCL ER BEADS 120 MG XR24H-CAP (DILTIAZEM HCL ER BEADS) 1 by mouth once daily 7)  FUROSEMIDE 40 MG TABS (FUROSEMIDE) Take 1 tablet by mouth two times a day 8)  ATENOLOL 100 MG TABS (ATENOLOL) 1 by mouth q am 1/2 q pm 9)  COLCRYS 0.6 MG TABS (COLCHICINE) 1 tab by mouth as needed for gouty flair 10)  COUMADIN 5 MG TABS (WARFARIN SODIUM) 1 1/2 tabs by mouth daily [BMN] 11)  CALCIUM-MAGNESIUM-ZINC 750-375-15 MG TABS (CALCIUM-MAGNESIUM-ZINC) 1 by mouth two times a day as needed 12)  VITAMIN C 1000 MG TABS (ASCORBIC ACID) 1 by mouth once daily 13)  VITAMIN E 400 UNIT CAPS (VITAMIN E) 1 by mouth once daily 14)  B COMPLEX  TABS (B COMPLEX VITAMINS) once daily 15)  NIASPAN 500 MG CR-TABS (NIACIN (ANTIHYPERLIPIDEMIC)) 1 tab by mouth at bedtime for 7 days, then increase to 2 tabs at bedtime 16)      QUESTRAN 4 GM PACK (CHOLESTYRAMINE) 4 gm mixed in liquid daily 17)  CIALIS 20 MG TABS (TADALAFIL) Dr. Maricela Bo samples. 18)  HYDROCODONE-ACETAMINOPHEN 5-500 MG TABS (HYDROCODONE-ACETAMINOPHEN) 1 tab by mouth every 4 hours as needed for severe pain   Past Medical History: 1)   1. Status post mechanical aortic valve replaced at 50 years old  2)       secondary to a traumatic incident requiring a saphenous vein graft  3)       bypass  to the right coronary artery.  4)   2. Hypertension.  5)   3. Peripheral vascular disease by his account.  6)   4. Obesity.  7)   5. Atrial fibrillation currently on Coumadin therapy.  8)   6. Varicose veins 9)   7. Morbid obesity 10)       Prior History of Blood Transfusions:   Pertinent Labs:    Thank you again for agreeing to see our patient; please contact us if you have any further questions or need additional information.  Sincerely,  Julieanne Manson MD

## 2010-10-09 NOTE — Progress Notes (Signed)
Summary: Wants to know what to do next--NS referral and lab scheduling  Phone Note Call from Patient   Summary of Call: Pt. states that he has been informed of results of MRI and that surgical intervention has been suggested by the radiologist-- is in "dire agony" and would like to know if there is someone we can refer him to that takes Medicaid.  Wants to know if you could speak with him directly and advise of potential minimally invasive remedies, if possible.  But wants to let you know that he is open to any procedure that will provide relief.  Would appreciate "getting the ball rolling" ASAP -- just let him know what he needs to do next. Initial call taken by: Dutch Quint RN,  October 01, 2010 2:40 PM  Follow-up for Phone Call        Will refer him to [facility].   Discussed may be able to go to Interventional radiology to have cyst drained, but not certain if that is the best way to go with this and would like to have NS opinion. 1.  Please schedule him to have FLP, CMET with next protime--should be a fasting lab visit. 2.  [name]--after you are done with this referral--letter and orders done--send MRI as well-please send back to [facility] to get above labs scheduled  Follow-up by: Julieanne Manson MD,  October 01, 2010 6:36 PM  Additional Follow-up for Phone Call Additional follow up Details #1::        I send the Referral to [facility] waiting for an appt  Additional Follow-up by: [name],  October 02, 2010 11:42 AM  New Problems: SPINAL STENOSIS, LUMBAR (ICD-724.02)   Additional Follow-up for Phone Call Additional follow up Details #2::    PT HAS AN APPT 10-07-10 @ 11:00 AM  ARRIVAL 10:45 AM   DR CABBELL  PT AWARE OF HIS APPT  Follow-up by: [name],  October 02, 2010 4:21 PM  New Problems: SPINAL STENOSIS, LUMBAR (ICD-724.02)

## 2010-10-15 ENCOUNTER — Telehealth (INDEPENDENT_AMBULATORY_CARE_PROVIDER_SITE_OTHER): Payer: Self-pay | Admitting: Internal Medicine

## 2010-10-17 NOTE — Progress Notes (Signed)
Summary: WANTS 2nd OPINION  Phone Note Call from Patient   Summary of Call: Lance Cook PT. Lance Cook CALLED AND SAYS THAT HE WANTS A SECOND OPINION ABOUT HAVING HIS BACK SURGERY. Initial call taken by: Leodis Rains,  October 07, 2010 4:08 PM  Follow-up for Phone Call        Dr. is telling him he needs an open back surgery, to fuse his spine.  Would like a second opinion before undergoing surgery, querying neuroblock, possibly other choices, as well as wanting to speak to another surgeon to get their opinion before committing.   Follow-up by: Dutch Quint RN,  October 08, 2010 12:36 PM  Additional Follow-up for Phone Call Additional follow up Details #1::        [name]--can we refer him to [facility] NS for another opinion?  Thanks Additional Follow-up by: Julieanne Manson MD,  October 11, 2010 8:52 AM    Additional Follow-up for Phone Call Additional follow up Details #2::    SENT A REFERRAL TO [facility]  NEUROS[facility]GE[facility]  WAITING FOR AN APPT .Marland KitchenCheryll Dessert  October 11, 2010 11:06 AM

## 2010-10-23 ENCOUNTER — Encounter: Payer: Self-pay | Admitting: Cardiology

## 2010-10-23 ENCOUNTER — Ambulatory Visit (INDEPENDENT_AMBULATORY_CARE_PROVIDER_SITE_OTHER): Payer: Medicaid Other | Admitting: Cardiology

## 2010-10-23 DIAGNOSIS — I4891 Unspecified atrial fibrillation: Secondary | ICD-10-CM

## 2010-10-23 NOTE — Letter (Signed)
Summary: [facility]  [facility]   Imported By: Arta Bruce 10/17/2010 12:28:12  _____________________________________________________________________  External Attachment:    Type:   Image     Comment:   External Document

## 2010-10-23 NOTE — Progress Notes (Signed)
Summary: Question re neurosurgery and needs pain med refill  Phone Note Call from Patient   Summary of Call: Called [facility] in [place] and faxed them a copy of his MRI report -- they basically said yes, he is a candidate for the laser surgery,  but do not take Medicaid, would have to pay almost $23,000 up front for everything.  Wants to know if [facility] hospital does laser surgery.  Advised that he should ask his physician, but that I would sent this to you for your input.  Is laser surgery a truly viable option for him and is it advisable?  Would you say that the laser is more preferred over the open surgery in view of his history?  Also needs refill of his pain medication.   Initial call taken by: Dutch Quint RN,  October 15, 2010 12:01 PM  Follow-up for Phone Call        Pt. called again re pain med -- please fill before weekend.  Dutch Quint RN  October 18, 2010 11:06 AM   Additional Follow-up for Phone Call Additional follow up Details #1::        See next phone note--tried to call--phone kept disconnecting. Additional Follow-up by: Julieanne Manson MD,  October 18, 2010 5:43 PM    Prescriptions: HYDROCODONE-ACETAMINOPHEN 5-500 MG TABS (HYDROCODONE-ACETAMINOPHEN) 1 tab by mouth every 4 hours as needed for severe pain  #60 x 0   Entered and Authorized by:   Julieanne Manson MD   Signed by:   Julieanne Manson MD on 10/18/2010   Method used:   Printed then faxed to ...       [facility] [id number] (retail)       [address] [place], [place]  [address]       Ph: [phone]       Fax: [phone]   RxID:   [id number]

## 2010-10-23 NOTE — Miscellaneous (Signed)
Summary: Rehab Report/DISCHARGE [facility]  Rehab Report/DISCHARGE [facility]   Imported By: Arta Bruce 10/17/2010 11:57:43  _____________________________________________________________________  External Attachment:    Type:   Image     Comment:   External Document

## 2010-10-25 ENCOUNTER — Telehealth (INDEPENDENT_AMBULATORY_CARE_PROVIDER_SITE_OTHER): Payer: Self-pay | Admitting: Internal Medicine

## 2010-10-29 NOTE — Assessment & Plan Note (Signed)
Summary: ov. per pt wife call today. gd   Visit Type:  Follow-up Primary Provider:  Julieanne Manson MD  CC:  Atrial Fib and AVR.  History of Present Illness: The patient presents for follow up of the above.  This I last saw him he has had no new cardiovascular complaints.  He has some chronic dyspnea with exertion. He has a history of chronic lower extremity edema and venous stasis. He takes Lasix and does take a higher dose as needed for increased dyspnea or edema. This typically treats this. He has no new PND or orthopnea. He does feel palpitations with his chronic atrial fibrillation but has had no presyncope or syncope. He has no chest discomfort, neck or arm discomfort. He has had no new weight gain.    Of note he is being evaluated for possible spine surgery. This appointment serves as preoperative clearance. He did have had a stress perfusion study in 2011 demonstrated no ischemia.  Current Medications (verified): 1)  K-99 595 Mg Caps (Potassium Gluconate) .Marland Kitchen.. 1 By Mouth Two Times A Day 2)  Nexium 40 Mg Cpdr (Esomeprazole Magnesium) .Marland Kitchen.. 1 Cap By Mouth On Empty Stomach Daily 3)  Ferrous Sulfate 325 (65 Fe) Mg Tabs (Ferrous Sulfate) .Marland Kitchen.. 1 By Mouth Two Times A Day 4)  Allopurinol 300 Mg Tabs (Allopurinol) .Marland Kitchen.. 1 By Mouth Once Daily 5)  Lanoxin 0.25 Mg Tabs (Digoxin) .Marland Kitchen.. 1 By Mouth Once Daily 6)  Diltiazem Hcl Er Beads 120 Mg Xr24h-Cap (Diltiazem Hcl Er Beads) .Marland Kitchen.. 1 By Mouth Once Daily 7)  Furosemide 40 Mg Tabs (Furosemide) .... Take 1 Tablet By Mouth Two Times A Day 8)  Atenolol 100 Mg Tabs (Atenolol) .Marland Kitchen.. 1 By Mouth Q Am 1/2 Q Pm 9)  Colcrys 0.6 Mg Tabs (Colchicine) .Marland Kitchen.. 1 Tab By Mouth As Needed For Gouty Flair 10)  Coumadin 5 Mg Tabs (Warfarin Sodium) .Marland Kitchen.. 1 1/2 Tabs By Mouth Daily 11)  Calcium-Magnesium-Zinc 750-375-15 Mg Tabs (Calcium-Magnesium-Zinc) .Marland Kitchen.. 1 By Mouth Two Times A Day As Needed 12)  Vitamin C 1000 Mg Tabs (Ascorbic Acid) .Marland Kitchen.. 1 By Mouth Once Daily 13)  Vitamin  E 400 Unit Caps (Vitamin E) .Marland Kitchen.. 1 By Mouth Once Daily 14)  B Complex  Tabs (B Complex Vitamins) .... Once Daily 15)  Niaspan 500 Mg Cr-Tabs (Niacin (Antihyperlipidemic)) .Marland Kitchen.. 1 Tab By Mouth At Bedtime For 7 Days, Then Increase To 2 Tabs At Bedtime 16)  Questran 4 Gm Pack (Cholestyramine) .... 4 Gm Mixed in Liquid Daily As Needed 17)  Cialis 20 Mg Tabs (Tadalafil) .... Dr. Maricela Bo Samples. 18)  Hydrocodone-Acetaminophen 5-500 Mg Tabs (Hydrocodone-Acetaminophen) .Marland Kitchen.. 1 Tab By Mouth Every 4 Hours As Needed For Severe Pain  Allergies (verified): 1)  ! Penicillin  Past History:  Past Medical History: Reviewed history from 11/30/2009 and no changes required.  1. Status post mechanical aortic valve replaced at 50 years old       secondary to a traumatic incident requiring a saphenous vein graft       bypass to the right coronary artery.   2. Hypertension.   3. Peripheral vascular disease by his account.   4. Obesity.   5. Atrial fibrillation currently on Coumadin therapy.   6. Varicose veins  7. Morbid obesity     Past Surgical History: Reviewed history from 04/29/2010 and no changes required. 1.  1988 :Status post mechanical aortic valve 2.  03/2010:  Left Saphenous vein occlusion--laser ablation--Dr. Early   Review of Systems  Diffuse muscle and back aching. As stated in the history of present illness otherwise negative for all other systems.  Vital Signs:  Patient profile:   50 year old male Height:      60 inches Weight:      256 pounds BMI:     50.18 Pulse rate:   83 / minute Resp:     18 per minute BP sitting:   171 / 94  (right arm)  Vitals Entered By: Marrion Coy, CNA (October 23, 2010 12:45 PM)  Physical Exam  General:  Well developed, well nourished, in no acute distress. Head:  normocephalic and atraumatic Eyes:  PERRLA/EOM intact; conjunctiva and lids normal. Mouth:  Teeth, gums and palate normal. Oral mucosa normal. Neck:  Neck supple, no  JVD. No masses, thyromegaly or abnormal cervical nodes. Chest Wall:  well healed surgical scar Lungs:  Clear bilaterally to auscultation and percussion. Abdomen:  Bowel sounds positive; abdomen soft and non-tender without masses, organomegaly, or hernias noted. No hepatosplenomegaly, obese Msk:  Tender over L/S spinous processes per pt--no specific level more tender.  No definite paraspinous muscle tenderness Extremities:  Bilateral edema to the knees with chronic venous stasis changes and varicosities. Neurologic:  Alert and oriented x 3. Skin:  Intact without lesions or rashes. Cervical Nodes:  no significant adenopathy Psych:  Normal affect.   Detailed Cardiovascular Exam  Neck    Carotids: Carotids full and equal bilaterally without bruits.      Neck Veins: Normal, no JVD.    Heart    Inspection: no deformities or lifts noted.      Palpation: normal PMI with no thrills palpable.      Auscultation: S1 within normal limits, mechanical S2, irregular rhythm, 3/6 apical systolic murmur radiating up the aortic outflow tract, no diastolic murmurs  Vascular    Abdominal Aorta: no palpable masses, pulsations, or audible bruits.      Femoral Pulses: normal femoral pulses bilaterally.      Pedal Pulses: normal pedal pulses bilaterally.      Radial Pulses: normal radial pulses bilaterally.     EKG  Procedure date:  10/23/2010  Findings:      atrial fibrillation, left axis deviation, no acute ST-T wave changes  Impression & Recommendations:  Problem # 1:  PREOPERATIVE EXAMINATION (ICD-V72.84) The patient has had no new symptoms since his stress test last year. At this point no further cardiovascular testing is suggested. He would be an acceptable risk for planned surgery according to ACT/AHA guidelines.  Problem # 2:  COUMADIN THERAPY (ICD-V58.61) The patient would need bridging Lovenox when he discontinued Coumadin. I have discussed this with him at length and he would notify his  primary physician who manages his Coumadin.  Problem # 3:  AORTIC VALVE REPLACEMENT, HX OF (ICD-V43.3) He has had no change in symptoms or physical exam. No further pain of his aortic valve is indicated at this time. He continues on the meds as listed.  Problem # 4:  ESSENTIAL HYPERTENSION (ICD-401.9) He watches his blood pressure routinely at home and is typically in the 120 systolic. No change in therapy is indicated.  Patient Instructions: 1)  Your physician recommends that you schedule a follow-up appointment in: 1 year with Dr. Antoine Poche 2)  Your physician recommends that you continue on your current medications as directed. Please refer to the Current Medication list given to you today.

## 2010-10-29 NOTE — Progress Notes (Signed)
Summary: NEUROSURGEON REFERRAL   Phone Note From Other Clinic Call back at (cell) [phone]   Caller: Nurse Summary of Call: Zenovia Jarred [phone]  JUST CALL AND BEFORE THEY SEE MR Whipp THEY SUGGEST TO SEE A CARDIOLOGIST FIRST  Initial call taken by: [name],  October 15, 2010 4:19 PM  Follow-up for Phone Call        Spoke with pt. -- advised of need for cardiologist visit before seeing [facility] neuro.  Verbalized understanding and agreement.  Dutch Quint RN  October 18, 2010 11:05 AM   Additional Follow-up for Phone Call Additional follow up Details #1::        Tried to call pt, but phone would pick up and disconnect on 2 attempts--if pt. calls in --let him know I was trying to reach him and what happened. Additional Follow-up by: Julieanne Manson MD,  October 18, 2010 5:43 PM    Additional Follow-up for Phone Call Additional follow up Details #2::    Left message on answering machine and cell # for pt. to return call.  Dutch Quint RN  October 21, 2010 12:11 PM  Mr Kimoto returned your call, he says that he must have had a internet issue, but you can leave a message, he did say that he is going to call Dr. Lindaann Slough office to make his appt.Cala Bradford Tinnin  October 21, 2010 12:41 PM  Terrific, that is what I was planning to suggest.  [name]--can you call and get that second opinion set up after finding out when cardiology appt. is with pt?--just looked at chart and in to see Dr. Antoine Poche today.  Julieanne Manson MD  October 23, 2010 12:12 PM   *I call Bonita Quin in [place] and she told me to faxed the cardiologist notes I did that waiting for the Dr review it again and an appt .Marland KitchenCheryll Dessert  October 24, 2010 10:13 AM     Additional Follow-up for Phone Call Additional follow up Details #3:: Details for Additional Follow-up Action Taken:

## 2010-11-01 ENCOUNTER — Telehealth: Payer: Self-pay | Admitting: Cardiology

## 2010-11-01 ENCOUNTER — Telehealth (INDEPENDENT_AMBULATORY_CARE_PROVIDER_SITE_OTHER): Payer: Self-pay | Admitting: Internal Medicine

## 2010-11-05 ENCOUNTER — Telehealth (INDEPENDENT_AMBULATORY_CARE_PROVIDER_SITE_OTHER): Payer: Self-pay | Admitting: Internal Medicine

## 2010-11-06 ENCOUNTER — Encounter (INDEPENDENT_AMBULATORY_CARE_PROVIDER_SITE_OTHER): Payer: Self-pay | Admitting: Internal Medicine

## 2010-11-06 LAB — CONVERTED CEMR LAB: INR: 3.7

## 2010-11-07 NOTE — Progress Notes (Signed)
Summary: Presurgical consult with cardiologist  Phone Note Call from Patient   Summary of Call: Lance Cook. states he saw Dr. Antoine Poche yesterday and that doctor verbally confirmed that there is no reason he can't go for back surgery.  Needs to be bridged with Lovenox before the surgery.  This information should be in his forthcoming report.  Lance Cook. is anxious to proceed with next steps for surgery with [facility] neurologist.  Wants this sent to Dr. Delrae Alfred and Arna Medici for next referral steps. Initial call taken by: Dutch Quint RN,  October 25, 2010 12:03 PM  Follow-up for Phone Call        [name]--please get Lance Cook. referred to [facility].  Send my letter and MRI report of spine.  Let them know this is a second opinion as to how to proceed.   Please send Dr. Jenene Slicker note as well. Also, please let Lance Cook. know he is due for a protime--need to get him scheduled. Follow-up by: Julieanne Manson MD,  October 25, 2010 3:32 PM  Additional Follow-up for Phone Call Additional follow up Details #1::        Lance Cook. has been off Coumadin x3 days, waiting for refill.  Will call back to schedule appt. after getting back on for a week.    Lance Cook. very anxious re f/u on his referrals -- is "in agony."  Wants Arna Medici to let him know the status of his [facility] referral -- no news or otherwise, ASAP.  Dutch Quint RN  October 29, 2010 10:16 AM  Sent to Arna Medici.  Dutch Quint RN  October 29, 2010 10:23 AM     Additional Follow-up for Phone Call Additional follow up Details #2::    *I call Bonita Quin in [place] and she told me to faxed the cardiologist notes I did that waiting for the Dr review it again and an appt .Marland KitchenCheryll Dessert  October 24, 2010 10:13 AM  I call mr Purkey and i lvm regarding to his referral to [facility] .Marland KitchenCheryll Dessert  October 29, 2010 11:09 AM

## 2010-11-07 NOTE — Progress Notes (Signed)
Summary: pt calling re faxing surg clearence  Phone Note Call from Patient   Caller: Patient [phone] Reason for Call: Talk to Nurse Summary of Call: pt calling to get surgical clearence faxed to [facility] att Bonita Quin [phone] wants added to fax that surgery needs to be scheduled asap/pt wants a call when fax has been sent Initial call taken by: Glynda Jaeger,  November 01, 2010 2:06 PM  Follow-up for Phone Call        clearance was faxed by Dr [facility] office.  Pt may contact surgeon to schedule if he hasnt heard from them.  left message for pt to call Bonita Quin at [facility]  Avie Arenas, RN Follow-up by: Charolotte Capuchin, RN,  November 01, 2010 2:31 PM     Appended Document: pt calling re faxing surg clearence Dr Iqra Rotundo's office note/surgical clearance faxed to [facility] at the above number.  A note was placed on the cover sheet that pt and wife request the surgery be done ASAP.

## 2010-11-12 LAB — GLUCOSE, CAPILLARY
Glucose-Capillary: 162 mg/dL — ABNORMAL HIGH (ref 70–99)
Glucose-Capillary: 179 mg/dL — ABNORMAL HIGH (ref 70–99)

## 2010-11-12 NOTE — Letter (Signed)
Summary: ANTICOAGULATION DOSING  ANTICOAGULATION DOSING   Imported By: Arta Bruce 11/07/2010 10:51:40  _____________________________________________________________________  External Attachment:    Type:   Image     Comment:   External Document

## 2010-11-12 NOTE — Progress Notes (Signed)
Summary: phone call  Phone Note Call from Patient   Caller: Patient Call For: Julieanne Manson MD Summary of Call: Patient called requesting an appt. with Dr. Delrae Alfred. He states he took 2 Vicodin tablets last night  but he does not feel they do much. He says he is having excruciating  back pain and wants to know if he could have a muscle relaxant called in to [facility]. He states when he turns over in bed he has spasms and sciatica. Pt. advised will talk with MD as there are no appts. at this time and will call him  back. Pt. # [id number]) Initial call taken by: Sharen Heck RN,  November 01, 2010 9:02 AM  Follow-up for Phone Call        Dr. Delrae Alfred -- pt's wife called -- She wants you to see pt. today. -- She does not want to go to the ED b/c she states they don't know his history and they are not going to help him. -- Pt. has been having pain x2 days now but woke up this morning w/unbearable pain on his back and neck -- He can't turn onto his side w/o experiencing severe pain -- on a scale 1-10 his pain is "beyond 10". -- Hale Drone CMA  November 01, 2010 9:21 AM   Called wife -- Per Dr. Delrae Alfred -- Pt. can go to the ED if he can not stand the pain. -- Dr Delrae Alfred has no openings. -- Wife of pt. was very upset and was demanding to be seen. -- Told her that she can take the pt to the neurosergeon he saw -- She also wants a second opinion and would like to see another neurosergeon. -- Arna Medici has referred pt. to [facility]. -- Gave wife phone number and address -- She was very upset and said she will make her own appt's and wants Arna Medici to call her -- Will foward phone note Arna Medici. -- Hale Drone CMA  November 01, 2010 3:05 PM    Additional Follow-up for Phone Call Additional follow up Details #1::        He needs to go to the ED--if he comes in here and has to wait for an opening, he will be in more pain and cannot guarantee we can work him in.  The ED deals with this sort of thing all the time.   If he is  that bad, he may need admission to the hospital as well and going to the ED is the best place for that to be decided. Additional Follow-up by: Julieanne Manson MD,  November 01, 2010 3:05 PM    Additional Follow-up for Phone Call Additional follow up Details #2::    *PT HAS AN APPT 12-24-10 @ 2PM  [facility] PT AWARE OF HIS APPT.Marland KitchenCheryll Dessert  November 01, 2010 4:55 PM I CALL THE PT AND N/A .Marland KitchenCheryll Dessert  November 04, 2010 12:01 PM

## 2010-11-13 LAB — GLUCOSE, CAPILLARY: Glucose-Capillary: 115 mg/dL — ABNORMAL HIGH (ref 70–99)

## 2010-11-14 ENCOUNTER — Telehealth (INDEPENDENT_AMBULATORY_CARE_PROVIDER_SITE_OTHER): Payer: Self-pay | Admitting: Internal Medicine

## 2010-11-14 ENCOUNTER — Encounter (INDEPENDENT_AMBULATORY_CARE_PROVIDER_SITE_OTHER): Payer: Self-pay | Admitting: Internal Medicine

## 2010-11-14 LAB — GLUCOSE, CAPILLARY
Glucose-Capillary: 162 mg/dL — ABNORMAL HIGH (ref 70–99)
Glucose-Capillary: 163 mg/dL — ABNORMAL HIGH (ref 70–99)
Glucose-Capillary: 181 mg/dL — ABNORMAL HIGH (ref 70–99)

## 2010-11-17 LAB — DIFFERENTIAL
Basophils Relative: 0 % (ref 0–1)
Eosinophils Absolute: 0.1 10*3/uL (ref 0.0–0.7)
Eosinophils Relative: 1 % (ref 0–5)
Lymphs Abs: 1.6 10*3/uL (ref 0.7–4.0)
Monocytes Relative: 10 % (ref 3–12)

## 2010-11-17 LAB — COMPREHENSIVE METABOLIC PANEL
ALT: 37 U/L (ref 0–53)
AST: 36 U/L (ref 0–37)
Alkaline Phosphatase: 46 U/L (ref 39–117)
CO2: 30 mEq/L (ref 19–32)
Calcium: 9.4 mg/dL (ref 8.4–10.5)
GFR calc Af Amer: >60 mL/min (ref >60)
GFR calc non Af Amer: >60 mL/min (ref >60)
Potassium: 4.3 mEq/L (ref 3.5–5.1)
Sodium: 136 mEq/L (ref 135–145)
Total Protein: 7.7 g/dL (ref 6.0–8.3)

## 2010-11-17 LAB — GLUCOSE, CAPILLARY: Glucose-Capillary: 115 mg/dL — ABNORMAL HIGH (ref 70–99)

## 2010-11-17 LAB — CBC
MCHC: 32.3 g/dL (ref 30.0–36.0)
WBC: 6.8 10*3/uL (ref 4.0–10.5)

## 2010-11-20 LAB — CARDIAC PANEL(CRET KIN+CKTOT+MB+TROPI)
CK, MB: 2.9 ng/mL (ref 0.3–4.0)
CK, MB: 3.3 ng/mL (ref 0.3–4.0)
Relative Index: 3.1 — ABNORMAL HIGH (ref 0.0–2.5)
Relative Index: INVALID (ref 0.0–2.5)
Relative Index: INVALID (ref 0.0–2.5)
Total CK: 105 U/L (ref 7–232)
Troponin I: 0.02 ng/mL (ref 0.00–0.06)

## 2010-11-20 LAB — GLUCOSE, CAPILLARY
Glucose-Capillary: 113 mg/dL — ABNORMAL HIGH (ref 70–99)
Glucose-Capillary: 149 mg/dL — ABNORMAL HIGH (ref 70–99)
Glucose-Capillary: 162 mg/dL — ABNORMAL HIGH (ref 70–99)

## 2010-11-20 LAB — PROTIME-INR
INR: 2.01 — ABNORMAL HIGH (ref 0.00–1.49)
INR: 2.06 — ABNORMAL HIGH (ref 0.00–1.49)
INR: 2.25 — ABNORMAL HIGH (ref 0.00–1.49)
INR: 2.4 — ABNORMAL HIGH (ref 0.00–1.49)
INR: 2.58 — ABNORMAL HIGH (ref 0.00–1.49)
INR: 2.74 — ABNORMAL HIGH (ref 0.00–1.49)
Prothrombin Time: 22.6 seconds — ABNORMAL HIGH (ref 11.6–15.2)
Prothrombin Time: 23 seconds — ABNORMAL HIGH (ref 11.6–15.2)
Prothrombin Time: 24.3 seconds — ABNORMAL HIGH (ref 11.6–15.2)
Prothrombin Time: 24.7 seconds — ABNORMAL HIGH (ref 11.6–15.2)
Prothrombin Time: 26 seconds — ABNORMAL HIGH (ref 11.6–15.2)
Prothrombin Time: 27.5 seconds — ABNORMAL HIGH (ref 11.6–15.2)

## 2010-11-20 LAB — DIFFERENTIAL
Basophils Absolute: 0 10*3/uL (ref 0.0–0.1)
Basophils Absolute: 0 10*3/uL (ref 0.0–0.1)
Basophils Relative: 0 % (ref 0–1)
Basophils Relative: 1 % (ref 0–1)
Lymphocytes Relative: 21 % (ref 12–46)
Monocytes Absolute: 0.8 10*3/uL (ref 0.1–1.0)
Monocytes Relative: 10 % (ref 3–12)
Neutro Abs: 5.4 10*3/uL (ref 1.7–7.7)
Neutro Abs: 6.5 10*3/uL (ref 1.7–7.7)
Neutrophils Relative %: 67 % (ref 43–77)
Neutrophils Relative %: 73 % (ref 43–77)

## 2010-11-20 LAB — BASIC METABOLIC PANEL
BUN: 14 mg/dL (ref 6–23)
CO2: 28 mEq/L (ref 19–32)
CO2: 34 mEq/L — ABNORMAL HIGH (ref 19–32)
Calcium: 8.5 mg/dL (ref 8.4–10.5)
Calcium: 9 mg/dL (ref 8.4–10.5)
Calcium: 9.2 mg/dL (ref 8.4–10.5)
Creatinine, Ser: 0.74 mg/dL (ref 0.4–1.5)
Creatinine, Ser: 0.84 mg/dL (ref 0.4–1.5)
Creatinine, Ser: 0.89 mg/dL (ref 0.4–1.5)
GFR calc Af Amer: >60 mL/min (ref >60)
GFR calc Af Amer: >60 mL/min (ref >60)
GFR calc non Af Amer: >60 mL/min (ref >60)
GFR calc non Af Amer: >60 mL/min (ref >60)
Glucose, Bld: 99 mg/dL (ref 70–99)
Potassium: 4 mEq/L (ref 3.5–5.1)
Sodium: 136 mEq/L (ref 135–145)
Sodium: 137 mEq/L (ref 135–145)

## 2010-11-20 LAB — COMPREHENSIVE METABOLIC PANEL
Albumin: 3.7 g/dL (ref 3.5–5.2)
Alkaline Phosphatase: 45 U/L (ref 39–117)
BUN: 15 mg/dL (ref 6–23)
BUN: 18 mg/dL (ref 6–23)
CO2: 33 mEq/L — ABNORMAL HIGH (ref 19–32)
Chloride: 101 mEq/L (ref 96–112)
Creatinine, Ser: 0.79 mg/dL (ref 0.4–1.5)
GFR calc non Af Amer: >60 mL/min (ref >60)
Glucose, Bld: 103 mg/dL — ABNORMAL HIGH (ref 70–99)
Glucose, Bld: 103 mg/dL — ABNORMAL HIGH (ref 70–99)
Potassium: 4.2 mEq/L (ref 3.5–5.1)
Total Bilirubin: 0.9 mg/dL (ref 0.3–1.2)
Total Bilirubin: 1 mg/dL (ref 0.3–1.2)
Total Protein: 6.4 g/dL (ref 6.0–8.3)

## 2010-11-20 LAB — CBC
HCT: 27.1 % — ABNORMAL LOW (ref 39.0–52.0)
HCT: 35 % — ABNORMAL LOW (ref 39.0–52.0)
HCT: 35.4 % — ABNORMAL LOW (ref 39.0–52.0)
Hemoglobin: 10.5 g/dL — ABNORMAL LOW (ref 13.0–17.0)
Hemoglobin: 11.8 g/dL — ABNORMAL LOW (ref 13.0–17.0)
Hemoglobin: 8.9 g/dL — ABNORMAL LOW (ref 13.0–17.0)
Hemoglobin: 9.2 g/dL — ABNORMAL LOW (ref 13.0–17.0)
Hemoglobin: 9.6 g/dL — ABNORMAL LOW (ref 13.0–17.0)
MCHC: 32.6 g/dL (ref 30.0–36.0)
MCHC: 32.6 g/dL (ref 30.0–36.0)
MCHC: 33 g/dL (ref 30.0–36.0)
MCV: 81.8 fL (ref 78.0–100.0)
MCV: 81.8 fL (ref 78.0–100.0)
Platelets: 164 10*3/uL (ref 150–400)
Platelets: 165 10*3/uL (ref 150–400)
Platelets: 177 10*3/uL (ref 150–400)
Platelets: 189 10*3/uL (ref 150–400)
RBC: 3.46 MIL/uL — ABNORMAL LOW (ref 4.22–5.81)
RBC: 3.6 MIL/uL — ABNORMAL LOW (ref 4.22–5.81)
RBC: 3.68 MIL/uL — ABNORMAL LOW (ref 4.22–5.81)
RDW: 18.4 % — ABNORMAL HIGH (ref 11.5–15.5)
RDW: 19.8 % — ABNORMAL HIGH (ref 11.5–15.5)
RDW: 19.9 % — ABNORMAL HIGH (ref 11.5–15.5)
RDW: 20 % — ABNORMAL HIGH (ref 11.5–15.5)
RDW: 20 % — ABNORMAL HIGH (ref 11.5–15.5)
WBC: 7.3 10*3/uL (ref 4.0–10.5)
WBC: 7.6 10*3/uL (ref 4.0–10.5)
WBC: 7.7 10*3/uL (ref 4.0–10.5)
WBC: 8.9 10*3/uL (ref 4.0–10.5)

## 2010-11-20 LAB — MRSA CULTURE

## 2010-11-20 LAB — URINALYSIS, ROUTINE W REFLEX MICROSCOPIC
Bilirubin Urine: NEGATIVE
Hgb urine dipstick: NEGATIVE
Ketones, ur: NEGATIVE mg/dL
Nitrite: NEGATIVE
Nitrite: NEGATIVE
Protein, ur: NEGATIVE mg/dL
Specific Gravity, Urine: 1.009 (ref 1.005–1.030)
Urobilinogen, UA: 0.2 mg/dL (ref 0.0–1.0)
Urobilinogen, UA: 0.2 mg/dL (ref 0.0–1.0)
pH: 6 (ref 5.0–8.0)

## 2010-11-20 LAB — BRAIN NATRIURETIC PEPTIDE: Pro B Natriuretic peptide (BNP): 127 pg/mL — ABNORMAL HIGH (ref 0.0–100.0)

## 2010-11-20 LAB — CK TOTAL AND CKMB (NOT AT ARMC): Total CK: 113 U/L (ref 7–232)

## 2010-11-20 LAB — POCT CARDIAC MARKERS
CKMB, poc: 2.4 ng/mL (ref 1.0–8.0)
Myoglobin, poc: 75.7 ng/mL (ref 12–200)
Troponin i, poc: <0.05 ng/mL (ref 0.00–0.09)

## 2010-11-20 LAB — HEMOGLOBIN A1C: Hgb A1c MFr Bld: 6.1 % (ref 4.6–6.1)

## 2010-11-20 LAB — LIPID PANEL
Cholesterol: 126 mg/dL (ref 0–200)
HDL: 20 mg/dL — ABNORMAL LOW (ref >39)
Triglycerides: 228 mg/dL — ABNORMAL HIGH (ref <150)

## 2010-11-20 LAB — DIGOXIN LEVEL: Digoxin Level: 0.8 ng/mL (ref 0.8–2.0)

## 2010-11-24 LAB — CBC
HCT: 29.1 % — ABNORMAL LOW (ref 39.0–52.0)
HCT: 29.7 % — ABNORMAL LOW (ref 39.0–52.0)
HCT: 29.8 % — ABNORMAL LOW (ref 39.0–52.0)
Hemoglobin: 10 g/dL — ABNORMAL LOW (ref 13.0–17.0)
Hemoglobin: 10.2 g/dL — ABNORMAL LOW (ref 13.0–17.0)
Hemoglobin: 10.3 g/dL — ABNORMAL LOW (ref 13.0–17.0)
Hemoglobin: 9.5 g/dL — ABNORMAL LOW (ref 13.0–17.0)
Hemoglobin: 9.7 g/dL — ABNORMAL LOW (ref 13.0–17.0)
MCHC: 32.3 g/dL (ref 30.0–36.0)
MCHC: 32.9 g/dL (ref 30.0–36.0)
MCV: 81.4 fL (ref 78.0–100.0)
MCV: 81.4 fL (ref 78.0–100.0)
MCV: 81.8 fL (ref 78.0–100.0)
MCV: 82 fL (ref 78.0–100.0)
MCV: 82.4 fL (ref 78.0–100.0)
MCV: 82.4 fL (ref 78.0–100.0)
MCV: 82.4 fL (ref 78.0–100.0)
Platelets: 117 10*3/uL — ABNORMAL LOW (ref 150–400)
Platelets: 128 10*3/uL — ABNORMAL LOW (ref 150–400)
Platelets: 132 10*3/uL — ABNORMAL LOW (ref 150–400)
Platelets: 137 10*3/uL — ABNORMAL LOW (ref 150–400)
Platelets: 149 10*3/uL — ABNORMAL LOW (ref 150–400)
Platelets: 192 10*3/uL (ref 150–400)
RBC: 3.53 MIL/uL — ABNORMAL LOW (ref 4.22–5.81)
RBC: 3.54 MIL/uL — ABNORMAL LOW (ref 4.22–5.81)
RBC: 3.65 MIL/uL — ABNORMAL LOW (ref 4.22–5.81)
RBC: 3.66 MIL/uL — ABNORMAL LOW (ref 4.22–5.81)
RBC: 3.66 MIL/uL — ABNORMAL LOW (ref 4.22–5.81)
RBC: 3.67 MIL/uL — ABNORMAL LOW (ref 4.22–5.81)
RBC: 3.72 MIL/uL — ABNORMAL LOW (ref 4.22–5.81)
RBC: 3.8 MIL/uL — ABNORMAL LOW (ref 4.22–5.81)
RBC: 3.97 MIL/uL — ABNORMAL LOW (ref 4.22–5.81)
RDW: 18.2 % — ABNORMAL HIGH (ref 11.5–15.5)
RDW: 19.1 % — ABNORMAL HIGH (ref 11.5–15.5)
RDW: 19.3 % — ABNORMAL HIGH (ref 11.5–15.5)
RDW: 20 % — ABNORMAL HIGH (ref 11.5–15.5)
WBC: 4 10*3/uL (ref 4.0–10.5)
WBC: 4.9 10*3/uL (ref 4.0–10.5)
WBC: 5.5 10*3/uL (ref 4.0–10.5)
WBC: 6.4 10*3/uL (ref 4.0–10.5)
WBC: 7.3 10*3/uL (ref 4.0–10.5)
WBC: 7.4 10*3/uL (ref 4.0–10.5)
WBC: 8.6 10*3/uL (ref 4.0–10.5)
WBC: 9.2 10*3/uL (ref 4.0–10.5)

## 2010-11-24 LAB — PROTIME-INR
INR: 1.34 (ref 0.00–1.49)
INR: 1.54 — ABNORMAL HIGH (ref 0.00–1.49)
INR: 1.56 — ABNORMAL HIGH (ref 0.00–1.49)
INR: 1.92 — ABNORMAL HIGH (ref 0.00–1.49)
INR: 2 — ABNORMAL HIGH (ref 0.00–1.49)
INR: 2.02 — ABNORMAL HIGH (ref 0.00–1.49)
INR: 2.07 — ABNORMAL HIGH (ref 0.00–1.49)
INR: 2.32 — ABNORMAL HIGH (ref 0.00–1.49)
Prothrombin Time: 16.5 seconds — ABNORMAL HIGH (ref 11.6–15.2)
Prothrombin Time: 18.4 seconds — ABNORMAL HIGH (ref 11.6–15.2)
Prothrombin Time: 18.9 seconds — ABNORMAL HIGH (ref 11.6–15.2)
Prothrombin Time: 21.8 seconds — ABNORMAL HIGH (ref 11.6–15.2)
Prothrombin Time: 22.5 seconds — ABNORMAL HIGH (ref 11.6–15.2)
Prothrombin Time: 22.7 seconds — ABNORMAL HIGH (ref 11.6–15.2)

## 2010-11-24 LAB — BASIC METABOLIC PANEL
BUN: 5 mg/dL — ABNORMAL LOW (ref 6–23)
CO2: 30 mEq/L (ref 19–32)
CO2: 33 mEq/L — ABNORMAL HIGH (ref 19–32)
Calcium: 8.6 mg/dL (ref 8.4–10.5)
Chloride: 102 mEq/L (ref 96–112)
Chloride: 103 mEq/L (ref 96–112)
Chloride: 104 mEq/L (ref 96–112)
Chloride: 96 mEq/L (ref 96–112)
Chloride: 98 mEq/L (ref 96–112)
Creatinine, Ser: 0.7 mg/dL (ref 0.4–1.5)
Creatinine, Ser: 0.79 mg/dL (ref 0.4–1.5)
Creatinine, Ser: 0.83 mg/dL (ref 0.4–1.5)
GFR calc Af Amer: >60 mL/min (ref >60)
GFR calc Af Amer: >60 mL/min (ref >60)
GFR calc Af Amer: >60 mL/min (ref >60)
GFR calc Af Amer: >60 mL/min (ref >60)
GFR calc non Af Amer: >60 mL/min (ref >60)
GFR calc non Af Amer: >60 mL/min (ref >60)
Glucose, Bld: 100 mg/dL — ABNORMAL HIGH (ref 70–99)
Potassium: 3.6 mEq/L (ref 3.5–5.1)
Potassium: 3.7 mEq/L (ref 3.5–5.1)
Potassium: 4.2 mEq/L (ref 3.5–5.1)
Sodium: 135 mEq/L (ref 135–145)
Sodium: 136 mEq/L (ref 135–145)
Sodium: 138 mEq/L (ref 135–145)
Sodium: 139 mEq/L (ref 135–145)

## 2010-11-24 LAB — TYPE AND SCREEN: Antibody Screen: NEGATIVE

## 2010-11-24 LAB — DIGOXIN LEVEL: Digoxin Level: 1 ng/mL (ref 0.8–2.0)

## 2010-11-24 LAB — APTT
aPTT: 34 seconds (ref 24–37)
aPTT: 39 seconds — ABNORMAL HIGH (ref 24–37)
aPTT: 77 seconds — ABNORMAL HIGH (ref 24–37)

## 2010-11-24 LAB — URINALYSIS, ROUTINE W REFLEX MICROSCOPIC
Bilirubin Urine: NEGATIVE
Glucose, UA: NEGATIVE mg/dL
Hgb urine dipstick: NEGATIVE
Ketones, ur: NEGATIVE mg/dL
pH: 6 (ref 5.0–8.0)

## 2010-11-24 LAB — URINALYSIS, MICROSCOPIC ONLY
Bilirubin Urine: NEGATIVE
Glucose, UA: NEGATIVE mg/dL
Hgb urine dipstick: NEGATIVE
Ketones, ur: NEGATIVE mg/dL
Protein, ur: NEGATIVE mg/dL
Urobilinogen, UA: 0.2 mg/dL (ref 0.0–1.0)

## 2010-11-24 LAB — DIFFERENTIAL
Eosinophils Relative: 1 % (ref 0–5)
Lymphocytes Relative: 11 % — ABNORMAL LOW (ref 12–46)
Lymphs Abs: 1 10*3/uL (ref 0.7–4.0)
Monocytes Absolute: 0.6 10*3/uL (ref 0.1–1.0)
Monocytes Relative: 7 % (ref 3–12)

## 2010-11-24 LAB — GLUCOSE, CAPILLARY
Glucose-Capillary: 100 mg/dL — ABNORMAL HIGH (ref 70–99)
Glucose-Capillary: 102 mg/dL — ABNORMAL HIGH (ref 70–99)
Glucose-Capillary: 104 mg/dL — ABNORMAL HIGH (ref 70–99)
Glucose-Capillary: 108 mg/dL — ABNORMAL HIGH (ref 70–99)
Glucose-Capillary: 111 mg/dL — ABNORMAL HIGH (ref 70–99)
Glucose-Capillary: 141 mg/dL — ABNORMAL HIGH (ref 70–99)
Glucose-Capillary: 216 mg/dL — ABNORMAL HIGH (ref 70–99)
Glucose-Capillary: 241 mg/dL — ABNORMAL HIGH (ref 70–99)
Glucose-Capillary: 76 mg/dL (ref 70–99)
Glucose-Capillary: 79 mg/dL (ref 70–99)
Glucose-Capillary: 87 mg/dL (ref 70–99)
Glucose-Capillary: 98 mg/dL (ref 70–99)

## 2010-11-24 LAB — HEPARIN LEVEL (UNFRACTIONATED)
Heparin Unfractionated: 0.27 IU/mL — ABNORMAL LOW (ref 0.30–0.70)
Heparin Unfractionated: 0.29 IU/mL — ABNORMAL LOW (ref 0.30–0.70)
Heparin Unfractionated: 0.3 IU/mL (ref 0.30–0.70)
Heparin Unfractionated: <0.1 IU/mL — ABNORMAL LOW (ref 0.30–0.70)

## 2010-11-24 LAB — COMPREHENSIVE METABOLIC PANEL
AST: 24 U/L (ref 0–37)
Albumin: 3.6 g/dL (ref 3.5–5.2)
Calcium: 8.6 mg/dL (ref 8.4–10.5)
Chloride: 102 mEq/L (ref 96–112)
Creatinine, Ser: 0.84 mg/dL (ref 0.4–1.5)
GFR calc Af Amer: >60 mL/min (ref >60)
Total Bilirubin: 1.3 mg/dL — ABNORMAL HIGH (ref 0.3–1.2)
Total Protein: 6.5 g/dL (ref 6.0–8.3)

## 2010-11-24 LAB — ABO/RH: ABO/RH(D): B POS

## 2010-11-28 NOTE — Progress Notes (Signed)
Summary: Pt states out now, last time took to long to refill (2 days)  Phone Note Refill Request   Refills Requested: Medication #1:  vicodin Initial call taken by: Ernestine Mcmurray,  November 05, 2010 8:56 AM  Follow-up for Phone Call        spoke with Mr Dudding who states he did not go to emergency room but he called. He stated that he was advised that they would only put him on pain meds but since he was currently taking meds there was not anything else they could do for him expect have him f/u with his primary care provider. Pt did call this morning needing a refill on his pain meds (vicodin)did advise that request would be forwarded to Dr Delrae Alfred to review and would get back with him as soon as possible. Pt is going to call me back this afternoon to see if request has been reviewed.   Last rx 2/17.Marland KitchenHassell Halim CMA  November 05, 2010 11:09 AM    Additional Follow-up for Phone Call Additional follow up Details #1::        Please find out how often he is taking to control pain (and how many at a time).  This is an early refill. Additional Follow-up by: Julieanne Manson MD,  November 05, 2010 12:38 PM    Additional Follow-up for Phone Call Additional follow up Details #2::    Reportedly, pt. mentioned to staff that he carries a concealed handgun and may use it because of his pain. When asked about suicidal ideation, he states he is not thinking of suicide--he just wanted to get something done about his pain--never went to ED--gives multiple reasons why. Discussed he should never say things that can be taken as a suicide threat unless he means this in the future as we will take it seriously and have hime evaluated immediately. Unhappy that it has taken so long for him to get his second opinion at [facility].  His wife stayed on phone for 1 1/2 hours and was able to get him --discussed I was glad he was able to get an appt.  Discussed Arna Medici cannot stay on a phone for 1 1/2 hours as she has many  patients to consider.  Currently using 1-2 tabs 4 times daily of Hydrocdone.   Has not picked up hydrocodone from pharmacy that was to be faxed today.   Will switch him to Percocet  He needs to have protime done today--order written Follow-up by: Julieanne Manson MD,  November 05, 2010 2:44 PM  Additional Follow-up for Phone Call Additional follow up Details #3:: Details for Additional Follow-up Action Taken: Regina--please cancel the hydrocodone Rx. Additional Follow-up by: Julieanne Manson MD,  November 05, 2010 2:45 PM  New/Updated Medications: PERCOCET 5-325 MG TABS (OXYCODONE-ACETAMINOPHEN) 1 tab by mouth three times daily as needed for pain Prescriptions: PERCOCET 5-325 MG TABS (OXYCODONE-ACETAMINOPHEN) 1 tab by mouth three times daily as needed for pain  #90 x 0   Entered and Authorized by:   Julieanne Manson MD   Signed by:   Julieanne Manson MD on 11/05/2010   Method used:   Print then Give to Patient   RxID:   [id number] HYDROCODONE-ACETAMINOPHEN 5-500 MG TABS (HYDROCODONE-ACETAMINOPHEN) 1 tab by mouth every 4 hours as needed for severe pain  #60 x 0   Entered and Authorized by:   Julieanne Manson MD   Signed by:   Julieanne Manson MD on 11/05/2010   Method  used:   Printed then faxed to ...       [facility] [id number] (retail)       [address] [place], [place]  [address]       Ph: [phone]       Fax: [phone]   RxID:   [id number]

## 2010-11-28 NOTE — Letter (Signed)
Summary: ANTICOAGULATION DOSING  ANTICOAGULATION DOSING   Imported By: Arta Bruce 11/21/2010 15:34:16  _____________________________________________________________________  External Attachment:    Type:   Image     Comment:   External Document

## 2010-11-28 NOTE — Progress Notes (Signed)
  Phone Note Outgoing Call   Summary of Call: Dr. Delrae Alfred.... pt. came in today for PT/INR Pt: 37.8  .... INR: 2.9 He is taking 7.5 mg daily and has not changed his medication dosage and has not missed any doses Denies any problems Placed his results in you shelf... Hale Drone CMA  November 14, 2010 4:57 PM   Follow-up for Phone Call        He should stay on same dose and recheck in 1 month--please schedule with Coumadin clinic.  They should know his goal is between 2.5 and 3.5 Follow-up by: Julieanne Manson MD,  November 15, 2010 10:03 AM  Additional Follow-up for Phone Call Additional follow up Details #1::        Left message on answering machine for pt to call back.Marland KitchenMarland KitchenMarland KitchenArmenia [name]  November 19, 2010 11:29 AM   pt is aware and appt is scheduled Additional Follow-up by: [place] [name],  November 19, 2010 11:54 AM

## 2011-01-14 NOTE — Assessment & Plan Note (Signed)
OFFICE VISIT   KORVER, GRAYBEAL  DOB:  Dec 15, 1960                                       03/13/2010  UEAVW#:[id number]   The patient presents today for 1 week followup of laser ablation of his  left great saphenous vein.  He had mild discomfort related to this.  No  bruising.  He underwent repeat venous duplex today in our office and  this confirms closure of his great saphenous vein from his left knee to  the saphenofemoral junction.  He has no evidence of DVT.  He does have  known severe reflux in his deep veins.  I am pleased with his initial  result as is the patient and plan to see him again on an as-needed  basis.  He has questions regarding disability and handicapped parking  stickers, etc.  I explained that we would refer this to Dr. Delrae Alfred at  [facility] as his primary care Najah Liverman for all of his issues.  We will  see him again on an as-needed basis.     Larina Earthly, M.D.  Electronically Signed   TFE/MEDQ  D:  03/13/2010  T:  03/14/2010  Job:  [id number]

## 2011-01-14 NOTE — Procedures (Signed)
DUPLEX DEEP VENOUS EXAM - LOWER EXTREMITY   INDICATION:  Follow up left greater saphenous vein ablation.   HISTORY:  Edema:  Yes.  Trauma/Surgery:  Left greater saphenous vein ablation.  Pain:  Yes.  PE:  No.  Previous DVT:  No.  Anticoagulants:  No.  Other:  No.   DUPLEX EXAM:                CFV   SFV   PopV  PTV    GSV                R  L  R  L  R  L  R   L  R  L  Thrombosis    o  o     o     o      o     +  Spontaneous   +  +     +     +      +     o  Phasic        +  +     +     +      +     o  Augmentation  +  +     +     +      +     o  Compressible  +  +     +     +      +     o  Competent     o  o     +     +      +     o   Legend:  + - yes  o - no  p - partial  D - decreased   IMPRESSION:  There is no evidence of deep venous thrombus noted in the  left leg.  There is reflux noted in the right and left common femoral  vein.  The left greater saphenous vein appears ablated at the junction  to the knee level.    _____________________________  Larina Earthly, M.D.   CB/MEDQ  D:  03/13/2010  T:  03/13/2010  Job:  [id number]

## 2011-01-14 NOTE — Procedures (Signed)
LOWER EXTREMITY VENOUS REFLUX EXAM   INDICATION:  Phlebitis and varicose veins.   EXAM:  Using color-flow imaging and pulse Doppler spectral analysis, the  right and left common femoral, superficial femoral, popliteal, posterior  tibial, greater and lesser saphenous veins are evaluated.  There is  evidence suggesting deep venous insufficiency in the right and left  lower extremity.   The right and left saphenofemoral junctions are not competent with  Reflux of >560milliseconds. The left GSV is not competent with Reflux of  >584milliseconds with the caliber as described below.  The right GSV was  removed.  There is revascularization in the right leg.   The right and left proximal short saphenous veins demonstrate  competency.   GSV Diameter (used if found to be incompetent only)                                            Right          Left  Proximal Greater Saphenous Vein           cm             0.65 cm  Proximal-to-mid-thigh                     cm             0.48 cm  Mid thigh                                 cm             0.66 cm  Mid-distal thigh                          cm             0.66 cm  Distal thigh                              cm             0.70 cm  Knee                                      cm             0.66 cm   IMPRESSION:  1. The left greater saphenous vein Reflux with >54milliseconds is      identified with the caliber ranging from 0.70 cm to 0.48 cm knee to      groin.  2. The left greater saphenous vein is not aneurysmal.  3. The left greater saphenous vein is not tortuous.  4. The deep venous system is not competent with Reflux of      >571milliseconds.  5. The right and left lesser saphenous veins are competent.  6. The right leg shows revascularization with reflux.  A large      perforator in the right mid to distal calf with reflux.  7. Perforator in left distal calf with reflux.   ___________________________________________  Larina Earthly, M.D.   NT/MEDQ  D:  02/06/2010  T:  02/06/2010  Job:  [id number]

## 2011-01-14 NOTE — Assessment & Plan Note (Signed)
OFFICE VISIT   Lance Cook, Lance Cook  DOB:  25-Aug-1961                                       06/25/2010  EAVWU#:[id number]   The patient presents today for evaluation of a recent bleed from his  right lateral ankle above his malleolus.  He was seen in the [facility]  emergency department and had a nylon suture placed at that time.  He is  status post laser ablation of his left great saphenous vein by myself in  July and is also status post a remote history of ligation of his right  great saphenous vein.  Prior ultrasound in our office had revealed  incompetence in his deep systems bilaterally.  I did re-image left great  saphenous vein with ultrasound and SonoSite, and this showed a  successful ablation.  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  I did remove the suture and he does not have any  recurrent bleeding.  I explained to him the significance of his venous  hypertension and the lifelong chronic hassle that he is going to have  with bleeding.  I did explain first aid to this with placing of a 4x4  and Ace compression and leaving it for 24 to 48 hours with recheck.  He  does have some superficial ulcerations in this area of his lateral  ankle.  I have again stressed the importance of compression with him and  elevation when possible.  We did place a Silvadene over these  ulcerations and an Ace wrap today, and he was given a prescription for  Silvadene and instructed on the use of this.  He will see [place] on an as-  needed basis.     Larina Earthly, M.D.  Electronically Signed   TFE/MEDQ  D:  06/25/2010  T:  06/26/2010  Job:  [id number]

## 2011-01-14 NOTE — Assessment & Plan Note (Signed)
[facility]                                 ON-CALL NOTE   NAME:COHENJermani, Eberlein                            MRN:          [id number]  DATE:10/26/2009                            DOB:          1961-05-17    Mr. Lovelady called at 12:50 a.m. on August 25, 2010, stating that he was  recently discharged from the hospital and that he is having severe  diarrhea that is blood tinged.  He apparently has had a history of  diarrhea in the past.  He is concerned that it could be related to  potassium tablets and so I have asked him to discontinue those for the  time being.  My concern is that he could have infectious colitis.  I  asked him to present to the emergency department tonight or to go to his  doctor's office tomorrow for further evaluation.  He is going to try to  stay hydrated tonight and would prefer to go to an urgent care center  tomorrow morning.  This was agreeable.  We discussed that for  significant blood in the stool or lightheadedness that he would come on  in to the emergency room tonight.  He voices understanding.     Christell Faith, MD     NDL/MedQ  DD: 10/26/2009  DT: 10/26/2009  Job #: [id number]

## 2011-01-14 NOTE — Consult Note (Signed)
NEW PATIENT CONSULTATION   Lance Cook, Lance Cook  DOB:  1961/08/04                                       02/06/2010  EAVWU#:[id number]   The patient presents today for evaluation of extreme of bilateral venous  hypertension.  He has a long history of severe venous hypertension with  marked changes of chronic venous hypertension in both lower extremities.  He has had complications of both ulceration and multiple bleeding  episodes bilaterally around the level of his ankle.  He does not have  any history of DVT.  He had a recent episode of superficial  thrombophlebitis in his varices around the level of his left knee after  a recent cholecystectomy.  He has had open ulceration with very slow  healing.  He reports pain in both legs from his knees distally with  prolonged standing.  He has a history of non-insulin-dependent diabetes,  hypertension, elevated cholesterol and had complications of traumatic  aortic dissection at 50 years old.  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  He had harvest  of his right great saphenous vein at his groin for this procedure.   SOCIAL HISTORY:  He is married.  He is disabled.  He does not smoke or  drink alcohol.   REVIEW OF SYSTEMS:  He weighs 149 pounds.  He is 5 feet 1 inch tall.  He  has no weight loss or weight gain.  CARDIAC:  Positive for shortness of breath with exertion, palpitations  and chest pressure.  He has chronic atrial fibrillation.  PULMONARY:  Negative.  GI:  Positive for reflux, hiatal hernia, abdominal pain, diarrhea.  GU:  Negative.  VASCULAR:  He does have pain with walking, standing despite elevation.  He does have history of prior strokes.  NEUROLOGIC:  Reports dizziness and headaches.  ORTHOPEDIC:  He has muscle and joint pain.  SKIN:  He has chronic changes around his ankle.  PSYCHIATRIC:   Negative.  HEENT:  Only for needing glasses for visual changes.  HEMATOLOGIC:  He is on chronic Coumadin treatment due to his cardiac  atrial fibrillation.   PHYSICAL EXAM:  General:  Well-developed, well-nourished white male  appearing stated age.  Vital signs:  Blood pressure is 132/76, pulse 82,  respirations 16.  He is in no acute distress.  HEENT:  Unremarkable.  His abdomen does show mild obesity.  He does have 2+ radial and palpable  dorsalis pedis pulses.  He has marked venous hypertension with  hemosiderin deposits and micro dermal sclerosis over both lower  extremities up to the level of his knee.  He has multiple small varices  which are extremely superficial over both legs as well.   He underwent noninvasive vascular laboratory studies and I reviewed this  and discussed this with the patient.  This does show reflux in his left  great saphenous vein from his groin down to his calf.  On the right he  has surgical absence of his great saphenous vein and has multiple  neovascularity of veins throughout his thigh.  I explained the  significance of this to the patient.  He is worse on his left than on  his right.  I explained that with the deep system incompetence that  there is no way to cure his venous hypertension.  I did explain the  potential improvement in his left leg only with laser ablation of his  great saphenous vein to correct his superficial venous hypertension.  He  understands and wishes to proceed with this.  He knows that he is going  to have a lifelong difficulty especially if he is required to stand for  prolonged periods of time and the more he can elevate his legs the less  swelling, pain and bleeding he will experience.  He will see [place] again  and wishes to proceed with laser ablation of his left great saphenous  vein as an outpatient in our office.     Larina Earthly, M.D.  Electronically Signed   TFE/MEDQ  D:  02/06/2010  T:  02/07/2010  Job:  [id number]    cc:   Marcene Duos, M.D.  [employer]

## 2011-01-14 NOTE — Assessment & Plan Note (Signed)
OFFICE VISIT   Lance Cook, Lance Cook  DOB:  [birth year]                                       03/06/2010  ZOXWR#:[id number]   The patient presents today for treatment of his left great saphenous  vein reflux.  He underwent uneventful ablation of his left great  saphenous vein from the knee to the saphenofemoral junction.  He had no  immediate complications and tolerated the procedure quite well.  He will  be seen again in 1 week for ultrasound duplex followup.  He has been  unable to tolerate compression garments and therefore was wrapped with  an Ace wrap today and had demonstration for his wife, who will be  assisting with this until he is seen again.     Larina Earthly, M.D.  Electronically Signed   TFE/MEDQ  D:  03/06/2010  T:  03/06/2010  Job:  [id number]

## 2011-01-28 ENCOUNTER — Telehealth: Payer: Self-pay | Admitting: Cardiology

## 2011-01-28 NOTE — Telephone Encounter (Signed)
Clearance faxed

## 2011-01-28 NOTE — Telephone Encounter (Signed)
Surgical clearance [facility]  [phone]

## 2011-01-28 NOTE — Telephone Encounter (Signed)
The patient would be at acceptable risk for the surgery as in the previous note.  Again, he would need bridging anticoagulation as in discussed in the clinic note.

## 2011-01-28 NOTE — Telephone Encounter (Signed)
Per wife - pt is having back surgery - the date has not been set as of yet.  Pt had been cleared for surgery in March by Dr Antoine Poche however it has not been preformed as of yet.  Will verify with Dr Antoine Poche that pt is still OK for surgery.

## 2011-04-23 ENCOUNTER — Encounter: Payer: Self-pay | Admitting: Vascular Surgery

## 2011-04-24 ENCOUNTER — Encounter: Payer: Self-pay | Admitting: Vascular Surgery

## 2011-04-29 ENCOUNTER — Encounter: Payer: Self-pay | Admitting: Vascular Surgery

## 2011-04-30 ENCOUNTER — Encounter: Payer: Self-pay | Admitting: Thoracic Diseases

## 2011-04-30 ENCOUNTER — Other Ambulatory Visit: Payer: Medicaid Other

## 2011-04-30 ENCOUNTER — Ambulatory Visit: Payer: Medicaid Other | Admitting: Vascular Surgery

## 2011-04-30 ENCOUNTER — Telehealth: Payer: Self-pay | Admitting: Vascular Surgery

## 2011-04-30 NOTE — Telephone Encounter (Signed)
The patient has been schedule to see the PA 05/01/11 at 1:20 pm.  [name] is aware to arrive at the office at 1 pm.  Lance Cook

## 2011-04-30 NOTE — Telephone Encounter (Signed)
Mrs. Leeth contacted our office to speak with the office manager with a compliant about how her husband's appointment and their calls, today, were handled.  She complains that:(1) the patient had to wait a week from the date of his call for an appointment; (2) the patient specifically asked for a reminder call and did not receive one; (3) he had not been worked in for an appointment today; and (4) the staff member who handled her call was "abnoxious."  Mrs. Coran stated that the patient, currently, has a spot (about the size of an eraser) that has burst open and bled.  She would like someone to look at the spot, today or tomorrow.  Per Dr. Arbie Cookey, the patient's spot can be examined by a PA.    In follow-up, I've discovered that: (1) there is not evidence that the original call to schedule the appointment was triaged, inappropriately, and no messages exist from the patient or his wife in [place], stating there was bleeding from the site; (2) the appointment reminder call that was made on 8/23 was to the home number listed, and the message left was not retrievable, per the patient -that number has been replaced with a working number in the patient's [facility] record; (3) the appointment work-in for today was not possible, per the Site Manager, because it needed a lab, first; and (4) the call was not witnessed and I can find not fault with the account provided by the staff member.  Nevertheless, I have reminded her to always provide good customer service, even in challenging situations.

## 2011-05-01 ENCOUNTER — Ambulatory Visit (INDEPENDENT_AMBULATORY_CARE_PROVIDER_SITE_OTHER): Payer: Medicaid Other | Admitting: Thoracic Diseases

## 2011-05-01 ENCOUNTER — Other Ambulatory Visit: Payer: Medicaid Other

## 2011-05-01 ENCOUNTER — Encounter: Payer: Self-pay | Admitting: Thoracic Diseases

## 2011-05-01 VITALS — HR 65 | Resp 16

## 2011-05-01 DIAGNOSIS — I8390 Asymptomatic varicose veins of unspecified lower extremity: Secondary | ICD-10-CM | POA: Insufficient documentation

## 2011-05-01 NOTE — Progress Notes (Signed)
[facility] [place]  Postoperative Visit Bypass Surgery  History of Present Illness  Lance Cook is a 50 y.o. male who presents for follow-up for: right leg varicosities which are bulbous and bleeding . Pt has had a right GSV harvest for CABG and Pt. Had laser ablation in left leg by Dr.Early on (Date: 03/06/2010).   Pt. Noted 2 areas of bleeding on the right lateral calf near knee and in the anterior shin where the skin is thin secondary to venous stasis changes. He states it takes 2-3 days for the se areas to heal. This is further complicated by the fact that he is on Coumadin for a Mechanical  AVR and A. Fib.  VASC. LAB Studies: done prior to his ablation showed a competent right LSV.  Physical Examination  Filed Vitals:   05/01/11 1540  Pulse: 65  Resp: 16    Pt is A&O x 3 Gait is normal Pt with severe venous stasis disease with skin changes and multiple bulbous varicosities.  He has a large tortuous varicosity just below the knee anterioraly approx. 1.5 cm in diameter. This may be in congruity with the area of bleeding at the shin. Hr has a superficial abrasion on the anterior shin which is scabbing over and not actively bleeding. Dorsalis Pedis pulse is present and palpable bilaterally.     Medical Decision Making  Lance Cook is a 50 y.o. year old male who presents with severe venous stasis disease and multiple varicosities despite removal of GSV on right and laser ablation on the left.  He has multiple varices over both lower extremities below the knees in the areas where the skin is quite thin due to venous stasis changes. This is a very difficult problem as doing stab avulsions or ligation of these larger varicosities in the areas of the skin changes may lead to wound healing problems. Pt was seen by Dr. Darrick Penna as well. We will have pt. RTC next week to discuss what intervention are available for this pt.

## 2011-05-02 ENCOUNTER — Inpatient Hospital Stay (INDEPENDENT_AMBULATORY_CARE_PROVIDER_SITE_OTHER)
Admission: RE | Admit: 2011-05-02 | Discharge: 2011-05-02 | Disposition: A | Discharge status: Home / Self Care | Payer: Medicaid Other | Source: Ambulatory Visit | Attending: Emergency Medicine | Admitting: Emergency Medicine

## 2011-05-02 ENCOUNTER — Encounter: Payer: Self-pay | Admitting: Vascular Surgery

## 2011-05-02 DIAGNOSIS — M549 Dorsalgia, unspecified: Secondary | ICD-10-CM

## 2011-05-06 ENCOUNTER — Encounter: Payer: Self-pay | Admitting: Vascular Surgery

## 2011-05-06 ENCOUNTER — Ambulatory Visit (INDEPENDENT_AMBULATORY_CARE_PROVIDER_SITE_OTHER): Payer: Medicare Other | Admitting: Vascular Surgery

## 2011-05-06 VITALS — BP 130/85 | HR 92 | Resp 16 | Ht 61.0 in | Wt 245.0 lb

## 2011-05-06 DIAGNOSIS — I83893 Varicose veins of bilateral lower extremities with other complications: Secondary | ICD-10-CM

## 2011-05-06 NOTE — Progress Notes (Signed)
The patient presents today for followup of severe venous hypertension bilaterally. He had an episode of bleeding from superficial tear varicosities over his right leg. He had vein harvested from his right great saphenous vein for bypass. This was years ago. He underwent successful laser ablation of his left great saphenous vein by myself in July of 2011.  Physical exam : Well-developed moderately obese in no acute distress. He has marked changes of venous hypertension with hemosiderin deposits bilaterally. He does have areas in both legs with the raised telangiectasia that looked to risk for bleeding.  I imaged with the SonoSite both legs. This shows successful ablation of his left great saphenous vein. He does have a remnant of great saphenous vein and the right leg from the knee to mid calf. He has marked varicosities under the skin to have his calf.  Assessment: Severe bilateral venous stasis disease and venous hypertension mainly related to deep venous reflux. I discussed this with the patient. I do not see any treatment options to improve his venous hypertension.  Plan: Continued elevation and compression for deep venous reflux. Patient understands first aid for bleeding and will institute this should happen. He will see [place] on an as-needed basis.

## 2011-06-02 ENCOUNTER — Emergency Department (HOSPITAL_COMMUNITY)
Admission: EM | Admit: 2011-06-02 | Discharge: 2011-06-02 | Disposition: A | Discharge status: Home / Self Care | Payer: Medicare Other | Attending: Emergency Medicine | Admitting: Emergency Medicine

## 2011-06-02 DIAGNOSIS — I4891 Unspecified atrial fibrillation: Secondary | ICD-10-CM | POA: Insufficient documentation

## 2011-06-02 DIAGNOSIS — Z951 Presence of aortocoronary bypass graft: Secondary | ICD-10-CM | POA: Insufficient documentation

## 2011-06-02 DIAGNOSIS — Z8673 Personal history of transient ischemic attack (TIA), and cerebral infarction without residual deficits: Secondary | ICD-10-CM | POA: Insufficient documentation

## 2011-06-02 DIAGNOSIS — R109 Unspecified abdominal pain: Secondary | ICD-10-CM | POA: Insufficient documentation

## 2011-06-02 DIAGNOSIS — I2510 Atherosclerotic heart disease of native coronary artery without angina pectoris: Secondary | ICD-10-CM | POA: Insufficient documentation

## 2011-06-02 DIAGNOSIS — G8929 Other chronic pain: Secondary | ICD-10-CM | POA: Insufficient documentation

## 2011-06-02 DIAGNOSIS — I872 Venous insufficiency (chronic) (peripheral): Secondary | ICD-10-CM | POA: Insufficient documentation

## 2011-06-02 DIAGNOSIS — Z7901 Long term (current) use of anticoagulants: Secondary | ICD-10-CM | POA: Insufficient documentation

## 2011-06-02 DIAGNOSIS — M546 Pain in thoracic spine: Secondary | ICD-10-CM | POA: Insufficient documentation

## 2011-06-02 DIAGNOSIS — Z8547 Personal history of malignant neoplasm of testis: Secondary | ICD-10-CM | POA: Insufficient documentation

## 2011-06-02 DIAGNOSIS — I10 Essential (primary) hypertension: Secondary | ICD-10-CM | POA: Insufficient documentation

## 2011-06-02 DIAGNOSIS — R609 Edema, unspecified: Secondary | ICD-10-CM | POA: Insufficient documentation

## 2011-06-02 DIAGNOSIS — I252 Old myocardial infarction: Secondary | ICD-10-CM | POA: Insufficient documentation

## 2011-06-02 DIAGNOSIS — M545 Low back pain, unspecified: Secondary | ICD-10-CM | POA: Insufficient documentation

## 2011-06-02 DIAGNOSIS — IMO0002 Reserved for concepts with insufficient information to code with codable children: Secondary | ICD-10-CM | POA: Insufficient documentation

## 2011-06-02 DIAGNOSIS — Z79899 Other long term (current) drug therapy: Secondary | ICD-10-CM | POA: Insufficient documentation

## 2011-06-02 DIAGNOSIS — I739 Peripheral vascular disease, unspecified: Secondary | ICD-10-CM | POA: Insufficient documentation

## 2011-06-02 LAB — URINALYSIS, ROUTINE W REFLEX MICROSCOPIC
Nitrite: NEGATIVE
Specific Gravity, Urine: 1.011 (ref 1.005–1.030)
Urobilinogen, UA: 0.2 mg/dL (ref 0.0–1.0)

## 2011-06-18 LAB — I-STAT 8, (EC8 V) (CONVERTED LAB)
BUN: 18
Bicarbonate: 25.5 — ABNORMAL HIGH
Glucose, Bld: 97
Sodium: 139
TCO2: 27
pH, Ven: 7.363 — ABNORMAL HIGH

## 2011-06-18 LAB — CBC
MCHC: 32.4
RBC: 5.03
WBC: 10.2

## 2011-06-18 LAB — PROTIME-INR
INR: 1.6 — ABNORMAL HIGH
Prothrombin Time: 19.6 — ABNORMAL HIGH

## 2011-06-18 LAB — DIFFERENTIAL
Basophils Relative: 1
Lymphs Abs: 2.7
Monocytes Relative: 12 — ABNORMAL HIGH
Neutro Abs: 6
Neutrophils Relative %: 59

## 2011-07-10 ENCOUNTER — Encounter: Payer: Self-pay | Admitting: Cardiology

## 2011-07-10 ENCOUNTER — Ambulatory Visit (INDEPENDENT_AMBULATORY_CARE_PROVIDER_SITE_OTHER): Payer: Medicare Other | Admitting: Cardiology

## 2011-07-10 DIAGNOSIS — Z954 Presence of other heart-valve replacement: Secondary | ICD-10-CM

## 2011-07-10 DIAGNOSIS — I4891 Unspecified atrial fibrillation: Secondary | ICD-10-CM

## 2011-07-10 DIAGNOSIS — I359 Nonrheumatic aortic valve disorder, unspecified: Secondary | ICD-10-CM

## 2011-07-10 DIAGNOSIS — I10 Essential (primary) hypertension: Secondary | ICD-10-CM

## 2011-07-10 DIAGNOSIS — Z952 Presence of prosthetic heart valve: Secondary | ICD-10-CM

## 2011-07-10 DIAGNOSIS — I2510 Atherosclerotic heart disease of native coronary artery without angina pectoris: Secondary | ICD-10-CM

## 2011-07-10 NOTE — Assessment & Plan Note (Signed)
The blood pressure is at target. No change in medications is indicated. We will continue with therapeutic lifestyle changes (TLC).  Though it is slightly high today it is controlled when he takes it at home.  He needs weight loss to continue to control this.
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
## 2011-07-10 NOTE — Assessment & Plan Note (Signed)
He had an old scar in his Lexiscan Myoview in 10/2009.  He has no new symptoms.  He needs continued risk reduction.

## 2011-07-10 NOTE — Assessment & Plan Note (Signed)
I will repeat an echo in February as it will have been two years.

## 2011-07-10 NOTE — Assessment & Plan Note (Signed)
The patient  tolerates this rhythm and rate control and anticoagulation. We will continue with the meds as listed.  

## 2011-07-10 NOTE — Patient Instructions (Signed)
Your physician has requested that you have an echocardiogram in February 2013. Echocardiography is a painless test that uses sound waves to create images of your heart. It provides your doctor with information about the size and shape of your heart and how well your heart's chambers and valves are working. This procedure takes approximately one hour. There are no restrictions for this procedure.  The current medical regimen is effective;  continue present plan and medications.  Follow up in 1 year with Dr Antoine Poche.  You will receive a letter in the mail 2 months before you are due.  Please call us when you receive this letter to schedule your follow up appointment.

## 2011-08-09 ENCOUNTER — Encounter: Payer: Self-pay | Admitting: Internal Medicine

## 2011-08-09 DIAGNOSIS — Z0000 Encounter for general adult medical examination without abnormal findings: Secondary | ICD-10-CM | POA: Insufficient documentation

## 2011-08-12 ENCOUNTER — Ambulatory Visit: Payer: Medicare Other | Admitting: Internal Medicine

## 2011-08-19 ENCOUNTER — Ambulatory Visit: Payer: Medicare Other | Admitting: Internal Medicine

## 2011-09-18 ENCOUNTER — Encounter (INDEPENDENT_AMBULATORY_CARE_PROVIDER_SITE_OTHER): Payer: Self-pay

## 2011-09-23 ENCOUNTER — Other Ambulatory Visit: Payer: Self-pay | Admitting: *Deleted

## 2011-09-23 ENCOUNTER — Other Ambulatory Visit: Payer: Self-pay | Admitting: Cardiology

## 2011-09-23 MED ORDER — DILTIAZEM HCL ER COATED BEADS 120 MG PO CP24: 120.0000 mg | ORAL_CAPSULE | Freq: Every day | ORAL | Status: DC

## 2011-09-23 MED ORDER — ATENOLOL 100 MG PO TABS: 100.0000 mg | ORAL_TABLET | Freq: Every day | ORAL | Status: DC

## 2011-09-23 MED ORDER — WARFARIN SODIUM 7.5 MG PO TABS: 7.5000 mg | ORAL_TABLET | Freq: Every day | ORAL | Status: DC

## 2011-09-23 NOTE — Telephone Encounter (Signed)
Pt states he has been seen at [facility] and is changing his PCP to Dr Jonny Ruiz. His appt with Dr Jonny Ruiz is not until 10/01/11. [facility] has been filling his medications.  Pt needs refill for atenolol 100mg  am 50mg  pm and diltiazem CD  120mg  daily. I will send these into [facility]. Pt also requesting a refill for Digoxin 0.25mg  daily--this is not on his medication list but pt states he has been taking this regularly. Pt also requesting a refill for coumadin. Pt states he has been getting pro times checked at [facility] and has plans to have this checked by Dr Jonny Ruiz in the future. Pt states he cannot get a refill for coumadin or digoxin  from Dr Jonny Ruiz or [facility]. I will forward to Dr Antoine Poche for review.

## 2011-09-23 NOTE — Telephone Encounter (Signed)
New Msg: Pt needs refill of digoxin: 0.25mg   QTY of atenolol and coumadin: 45--pt takes 1.5 per day   Please return pt call to discuss further if necessary.   Pt is out of all medications as of today--took last dose last night.

## 2011-09-29 ENCOUNTER — Telehealth: Payer: Self-pay | Admitting: Cardiology

## 2011-09-29 MED ORDER — DIGOXIN 250 MCG PO TABS: 250.0000 ug | ORAL_TABLET | Freq: Every day | ORAL | Status: DC

## 2011-09-29 NOTE — Telephone Encounter (Signed)
Spoke with pt , who is quite angry, about lanoxin not being called in last week.  Explained medication was not on his medication list.  It was on his EMR list. I could not find in ANY of his records where it had been discontinued in EPIC. He states he has been out of med 1 1/2 weeks and is symptomatic-- increased heart rate some shortness of breath. He does not want appt today to see NP. "I want this filled today!  You can call Dr. Antoine Poche.  He knows my case well!" I spoke with DOD Dr. Johney Frame.  Okay to fill with 2 refills until pt sees Dr. Antoine Poche back. Mylo Red RN

## 2011-09-29 NOTE — Telephone Encounter (Signed)
Pt received two refills on Friday, did not get his digoxin per our office not on his med list(i checked again and it is not) but whomever he talked to was to check with dr hochrein and get it called in, he needs asap, having heart palpitations "like crazy", if can't call in this am, requesting an emergency appt

## 2011-09-29 NOTE — Telephone Encounter (Signed)
lmtcb Debbie Tyse Auriemma RN  

## 2011-09-29 NOTE — Telephone Encounter (Signed)
Fu call Patient returning your call please call back asap

## 2011-10-01 ENCOUNTER — Ambulatory Visit: Payer: Medicare HMO | Admitting: Internal Medicine

## 2011-10-01 DIAGNOSIS — Z0289 Encounter for other administrative examinations: Secondary | ICD-10-CM

## 2011-10-08 ENCOUNTER — Encounter: Payer: Self-pay | Admitting: Internal Medicine

## 2011-10-08 ENCOUNTER — Other Ambulatory Visit (INDEPENDENT_AMBULATORY_CARE_PROVIDER_SITE_OTHER): Payer: Medicare HMO

## 2011-10-08 ENCOUNTER — Ambulatory Visit (INDEPENDENT_AMBULATORY_CARE_PROVIDER_SITE_OTHER): Payer: Medicare HMO | Admitting: Internal Medicine

## 2011-10-08 VITALS — BP 130/68 | HR 62 | Temp 97.9°F | Ht 61.5 in | Wt 256.5 lb

## 2011-10-08 DIAGNOSIS — M545 Low back pain: Secondary | ICD-10-CM

## 2011-10-08 DIAGNOSIS — E119 Type 2 diabetes mellitus without complications: Secondary | ICD-10-CM

## 2011-10-08 DIAGNOSIS — I639 Cerebral infarction, unspecified: Secondary | ICD-10-CM | POA: Insufficient documentation

## 2011-10-08 DIAGNOSIS — N529 Male erectile dysfunction, unspecified: Secondary | ICD-10-CM

## 2011-10-08 DIAGNOSIS — M48061 Spinal stenosis, lumbar region without neurogenic claudication: Secondary | ICD-10-CM | POA: Insufficient documentation

## 2011-10-08 DIAGNOSIS — Z0000 Encounter for general adult medical examination without abnormal findings: Secondary | ICD-10-CM

## 2011-10-08 DIAGNOSIS — Z125 Encounter for screening for malignant neoplasm of prostate: Secondary | ICD-10-CM

## 2011-10-08 DIAGNOSIS — I6350 Cerebral infarction due to unspecified occlusion or stenosis of unspecified cerebral artery: Secondary | ICD-10-CM

## 2011-10-08 DIAGNOSIS — R7302 Impaired glucose tolerance (oral): Secondary | ICD-10-CM

## 2011-10-08 DIAGNOSIS — G8929 Other chronic pain: Secondary | ICD-10-CM

## 2011-10-08 HISTORY — DX: Cerebral infarction, unspecified: I63.9

## 2011-10-08 HISTORY — DX: Cerebral infarction due to unspecified occlusion or stenosis of unspecified cerebral artery: I63.50

## 2011-10-08 HISTORY — DX: Male erectile dysfunction, unspecified: N52.9

## 2011-10-08 HISTORY — DX: Impaired glucose tolerance (oral): R73.02

## 2011-10-08 LAB — URINALYSIS, ROUTINE W REFLEX MICROSCOPIC
Bilirubin Urine: NEGATIVE
Hgb urine dipstick: NEGATIVE
Leukocytes, UA: NEGATIVE
Nitrite: NEGATIVE
pH: 6 (ref 5.0–8.0)

## 2011-10-08 LAB — CBC WITH DIFFERENTIAL/PLATELET
Basophils Absolute: 0 10*3/uL (ref 0.0–0.1)
Eosinophils Absolute: 0.3 10*3/uL (ref 0.0–0.7)
Lymphocytes Relative: 13.9 % (ref 12.0–46.0)
MCHC: 31.5 g/dL (ref 30.0–36.0)
MCV: 68.2 fl — ABNORMAL LOW (ref 78.0–100.0)
Monocytes Absolute: 1 10*3/uL (ref 0.1–1.0)
Neutrophils Relative %: 71.6 % (ref 43.0–77.0)
Platelets: 172 10*3/uL (ref 150.0–400.0)

## 2011-10-08 LAB — BASIC METABOLIC PANEL
BUN: 19 mg/dL (ref 6–23)
CO2: 30 mEq/L (ref 19–32)
Chloride: 102 mEq/L (ref 96–112)
Glucose, Bld: 83 mg/dL (ref 70–99)
Potassium: 5.1 mEq/L (ref 3.5–5.1)

## 2011-10-08 LAB — HEPATIC FUNCTION PANEL
Alkaline Phosphatase: 57 U/L (ref 39–117)
Bilirubin, Direct: 0.2 mg/dL (ref 0.0–0.3)
Total Bilirubin: 1.4 mg/dL — ABNORMAL HIGH (ref 0.3–1.2)

## 2011-10-08 LAB — LIPID PANEL
Cholesterol: 99 mg/dL (ref 0–200)
LDL Cholesterol: 60 mg/dL (ref 0–99)
Total CHOL/HDL Ratio: 4
VLDL: 15.2 mg/dL (ref 0.0–40.0)

## 2011-10-08 LAB — MICROALBUMIN / CREATININE URINE RATIO
Creatinine,U: 113.4 mg/dL
Microalb, Ur: 19.5 mg/dL — ABNORMAL HIGH (ref 0.0–1.9)

## 2011-10-08 LAB — PSA: PSA: 0.06 ng/mL — ABNORMAL LOW (ref 0.10–4.00)

## 2011-10-08 MED ORDER — DIGOXIN 250 MCG PO TABS: 250.0000 ug | ORAL_TABLET | Freq: Every day | ORAL | Status: DC

## 2011-10-08 MED ORDER — FUROSEMIDE 40 MG PO TABS: 40.0000 mg | ORAL_TABLET | Freq: Every day | ORAL | Status: DC | PRN

## 2011-10-08 MED ORDER — OXYCODONE-ACETAMINOPHEN 5-325 MG PO TABS: 1.0000 | ORAL_TABLET | Freq: Three times a day (TID) | ORAL | Status: DC | PRN

## 2011-10-08 MED ORDER — ESOMEPRAZOLE MAGNESIUM 40 MG PO CPDR: 40.0000 mg | DELAYED_RELEASE_CAPSULE | Freq: Every day | ORAL | Status: DC

## 2011-10-08 MED ORDER — METHOCARBAMOL 500 MG PO TABS: 500.0000 mg | ORAL_TABLET | Freq: Three times a day (TID) | ORAL | Status: DC

## 2011-10-08 MED ORDER — CHOLESTYRAMINE 4 GM/DOSE PO POWD: 4.0000 g | Freq: Every day | ORAL | Status: DC | PRN

## 2011-10-08 MED ORDER — ATENOLOL 100 MG PO TABS: 100.0000 mg | ORAL_TABLET | Freq: Every day | ORAL | Status: DC

## 2011-10-08 MED ORDER — POTASSIUM CHLORIDE ER 10 MEQ PO TBCR: 10.0000 meq | EXTENDED_RELEASE_TABLET | Freq: Every day | ORAL | Status: DC

## 2011-10-08 MED ORDER — TADALAFIL 20 MG PO TABS: 20.0000 mg | ORAL_TABLET | Freq: Every day | ORAL | Status: DC | PRN

## 2011-10-08 MED ORDER — COUMADIN 5 MG PO TABS: ORAL_TABLET | ORAL | Status: DC

## 2011-10-08 MED ORDER — ALLOPURINOL 300 MG PO TABS: 300.0000 mg | ORAL_TABLET | Freq: Every day | ORAL | Status: DC

## 2011-10-08 MED ORDER — DILTIAZEM HCL ER COATED BEADS 120 MG PO CP24: 120.0000 mg | ORAL_CAPSULE | Freq: Every day | ORAL | Status: DC

## 2011-10-08 NOTE — Patient Instructions (Signed)
Continue all other medications as before Please go to LAB in the Basement for the blood and/or urine tests to be done today Please call the phone number [phone] (the [employer] System) for results of testing in 2-3 days;  When calling, simply dial the number, and when prompted enter the MRN number above (the Medical Record Number) and the # key, then the message should start. Please return in 6 mo with Lab testing done 3-5 days before  

## 2011-10-09 LAB — IBC PANEL
Saturation Ratios: 4.1 % — ABNORMAL LOW (ref 20.0–50.0)
Transferrin: 348.2 mg/dL (ref 212.0–360.0)

## 2011-10-09 LAB — VITAMIN B12: Vitamin B-12: 422 pg/mL (ref 211–911)

## 2011-10-10 ENCOUNTER — Telehealth: Payer: Self-pay | Admitting: Internal Medicine

## 2011-10-10 DIAGNOSIS — D509 Iron deficiency anemia, unspecified: Secondary | ICD-10-CM

## 2011-10-10 NOTE — Telephone Encounter (Signed)
Done per emr 

## 2011-10-10 NOTE — Telephone Encounter (Signed)
Message copied by Corwin Levins on Fri Oct 10, 2011 10:29 AM ------      Message from: Pincus Sanes      Created: Fri Oct 10, 2011  8:54 AM       Patient informed of results. He has seen Dr. Evette Cristal in the past, BUT would like referral to another GI as did not have a positive experience with Dr. Evette Cristal

## 2011-10-12 ENCOUNTER — Encounter: Payer: Self-pay | Admitting: Internal Medicine

## 2011-10-12 DIAGNOSIS — I639 Cerebral infarction, unspecified: Secondary | ICD-10-CM | POA: Insufficient documentation

## 2011-10-12 DIAGNOSIS — C6290 Malignant neoplasm of unspecified testis, unspecified whether descended or undescended: Secondary | ICD-10-CM | POA: Insufficient documentation

## 2011-10-12 DIAGNOSIS — G459 Transient cerebral ischemic attack, unspecified: Secondary | ICD-10-CM | POA: Insufficient documentation

## 2011-10-12 DIAGNOSIS — G4733 Obstructive sleep apnea (adult) (pediatric): Secondary | ICD-10-CM | POA: Insufficient documentation

## 2011-10-12 NOTE — Assessment & Plan Note (Signed)
stable overall by hx and exam, most recent data reviewed with pt, and pt to continue medical treatment as before, for med refills 

## 2011-10-12 NOTE — Assessment & Plan Note (Signed)
S/p unilateral orchiectomy, for testosterone level, consider androgel pump 1.62

## 2011-10-12 NOTE — Assessment & Plan Note (Signed)
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
## 2011-10-12 NOTE — Assessment & Plan Note (Signed)
stable overall by hx and exam, most recent data reviewed with pt, and pt to continue medical treatment as before  Lab Results  Component Value Date   HGBA1C 5.9 10/08/2011

## 2011-10-13 ENCOUNTER — Other Ambulatory Visit: Payer: Self-pay

## 2011-10-13 ENCOUNTER — Ambulatory Visit (HOSPITAL_COMMUNITY): Payer: Medicare HMO | Attending: Cardiology | Admitting: Radiology

## 2011-10-13 DIAGNOSIS — Z952 Presence of prosthetic heart valve: Secondary | ICD-10-CM

## 2011-10-13 DIAGNOSIS — I2510 Atherosclerotic heart disease of native coronary artery without angina pectoris: Secondary | ICD-10-CM

## 2011-10-13 DIAGNOSIS — I10 Essential (primary) hypertension: Secondary | ICD-10-CM | POA: Insufficient documentation

## 2011-10-13 DIAGNOSIS — I4891 Unspecified atrial fibrillation: Secondary | ICD-10-CM | POA: Insufficient documentation

## 2011-10-13 DIAGNOSIS — E669 Obesity, unspecified: Secondary | ICD-10-CM | POA: Insufficient documentation

## 2011-10-15 ENCOUNTER — Encounter: Payer: Self-pay | Admitting: Cardiology

## 2011-10-15 ENCOUNTER — Ambulatory Visit (INDEPENDENT_AMBULATORY_CARE_PROVIDER_SITE_OTHER): Payer: Medicare HMO | Admitting: Cardiology

## 2011-10-15 VITALS — BP 150/70 | HR 74 | Ht 61.0 in | Wt 248.0 lb

## 2011-10-15 DIAGNOSIS — I350 Nonrheumatic aortic (valve) stenosis: Secondary | ICD-10-CM

## 2011-10-15 DIAGNOSIS — I10 Essential (primary) hypertension: Secondary | ICD-10-CM

## 2011-10-15 DIAGNOSIS — I359 Nonrheumatic aortic valve disorder, unspecified: Secondary | ICD-10-CM

## 2011-10-15 DIAGNOSIS — I4891 Unspecified atrial fibrillation: Secondary | ICD-10-CM

## 2011-10-15 DIAGNOSIS — R609 Edema, unspecified: Secondary | ICD-10-CM

## 2011-10-15 DIAGNOSIS — Z954 Presence of other heart-valve replacement: Secondary | ICD-10-CM

## 2011-10-15 NOTE — Assessment & Plan Note (Signed)
He tolerates anticoagulation and rate control. He has a stable chronic anemia and is being referred to gastroenterology. He is on iron replacement therapy.

## 2011-10-15 NOTE — Assessment & Plan Note (Signed)
He has a stable valve prosthesis. He will remain on therapies as listed. No change in therapy is indicated.

## 2011-10-15 NOTE — Patient Instructions (Signed)
Your physician has requested that you have an echocardiogram in one year. Echocardiography is a painless test that uses sound waves to create images of your heart. It provides your doctor with information about the size and shape of your heart and how well your heart's chambers and valves are working. This procedure takes approximately one hour. There are no restrictions for this procedure.  Continue current medications as listed.  Follow up in 1 year with Dr Antoine Poche.  You will receive a letter in the mail 2 months before you are due.  Please call us when you receive this letter to schedule your follow up appointment.

## 2011-10-15 NOTE — Assessment & Plan Note (Signed)
The blood pressure is high today but not on home readings. I have instructed the patient to record a blood pressure diary and recording this. This will be presented for my review and pending these results I will make further suggestions about changes in therapy for optimal blood pressure control.

## 2011-10-15 NOTE — Assessment & Plan Note (Signed)
We discussed conservative therapies of this.
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
## 2011-10-17 ENCOUNTER — Encounter: Payer: Self-pay | Admitting: Internal Medicine

## 2011-10-22 ENCOUNTER — Ambulatory Visit: Payer: Medicare HMO | Admitting: Internal Medicine

## 2011-10-27 ENCOUNTER — Telehealth: Payer: Self-pay | Admitting: Internal Medicine

## 2011-10-27 NOTE — Telephone Encounter (Signed)
Pt requesting niaspan 500 mg sent in ----[facility]--Pt ph# [phone] desire a call once this is completed---

## 2011-10-28 ENCOUNTER — Telehealth: Payer: Self-pay | Admitting: Internal Medicine

## 2011-10-28 MED ORDER — NIACIN ER (ANTIHYPERLIPIDEMIC) 500 MG PO TBCR: 500.0000 mg | EXTENDED_RELEASE_TABLET | Freq: Every day | ORAL | Status: DC

## 2011-10-28 NOTE — Telephone Encounter (Signed)
Faxed hardcopy to [facility]  

## 2011-10-28 NOTE — Telephone Encounter (Signed)
Done hardcopy to [facility]  

## 2011-10-29 ENCOUNTER — Encounter: Payer: Self-pay | Admitting: Internal Medicine

## 2011-11-14 ENCOUNTER — Encounter: Payer: Self-pay | Admitting: Internal Medicine

## 2011-11-19 ENCOUNTER — Ambulatory Visit: Payer: Medicare HMO | Admitting: Internal Medicine

## 2011-12-11 ENCOUNTER — Encounter: Payer: Self-pay | Admitting: Internal Medicine

## 2011-12-16 ENCOUNTER — Encounter: Payer: Self-pay | Admitting: Internal Medicine

## 2011-12-16 ENCOUNTER — Ambulatory Visit (INDEPENDENT_AMBULATORY_CARE_PROVIDER_SITE_OTHER): Payer: Medicare HMO | Admitting: Internal Medicine

## 2011-12-16 ENCOUNTER — Other Ambulatory Visit (INDEPENDENT_AMBULATORY_CARE_PROVIDER_SITE_OTHER): Payer: Medicare HMO

## 2011-12-16 ENCOUNTER — Telehealth: Payer: Self-pay | Admitting: Gastroenterology

## 2011-12-16 VITALS — BP 128/68 | HR 64 | Ht 60.0 in | Wt 245.2 lb

## 2011-12-16 DIAGNOSIS — D509 Iron deficiency anemia, unspecified: Secondary | ICD-10-CM

## 2011-12-16 DIAGNOSIS — Z1211 Encounter for screening for malignant neoplasm of colon: Secondary | ICD-10-CM

## 2011-12-16 DIAGNOSIS — Z86010 Personal history of colonic polyps: Secondary | ICD-10-CM

## 2011-12-16 DIAGNOSIS — K649 Unspecified hemorrhoids: Secondary | ICD-10-CM

## 2011-12-16 LAB — LACTATE DEHYDROGENASE: LDH: 318 U/L — ABNORMAL HIGH (ref 94–250)

## 2011-12-16 LAB — CBC WITH DIFFERENTIAL/PLATELET
Basophils Absolute: 0.1 10*3/uL (ref 0.0–0.1)
Basophils Relative: 0.7 % (ref 0.0–3.0)
Eosinophils Absolute: 0.1 10*3/uL (ref 0.0–0.7)
MCHC: 31 g/dL (ref 30.0–36.0)
MCV: 71.8 fl — ABNORMAL LOW (ref 78.0–100.0)
Monocytes Absolute: 0.6 10*3/uL (ref 0.1–1.0)
Neutrophils Relative %: 76.7 % (ref 43.0–77.0)
Platelets: 160 10*3/uL (ref 150.0–400.0)
RBC: 5.34 Mil/uL (ref 4.22–5.81)
RDW: 23.1 % — ABNORMAL HIGH (ref 11.5–14.6)

## 2011-12-16 MED ORDER — INTEGRA 62.5-62.5-40-3 MG PO CAPS: 1.0000 | ORAL_CAPSULE | Freq: Every day | ORAL | Status: DC

## 2011-12-16 NOTE — Telephone Encounter (Signed)
  12/16/2011    RE: Lance Cook DOB: Aug 31, 1961 MRN: [id number]   Dear Dr. Antoine Poche,    We have scheduled the above patient for an endoscopic procedure. Our records show that he is on anticoagulation therapy.   Please advise as to how long the patient may come off his therapy of coumadin prior to the procedure, which is scheduled for 01/13/2012.  Please fax back/ or route the completed form to [place] or Aram Beecham at [phone].   Sincerely,  Vergil Burby, [name]

## 2011-12-16 NOTE — Patient Instructions (Signed)
You have been scheduled for a colonoscopy/endoscopy with propofol. Please follow written instructions given to you at your visit today.  Please pick up your prep kit at the pharmacy within the next 1-3 days.  Your physician has requested that you go to the basement for lab work before leaving today.  You will be contacted by our office prior to your procedure for directions on holding your coumadin/warfarin. If you do not hear from our office at least 1 week prior to your scheduled procedure, please call our office at [phone] to discuss.

## 2011-12-16 NOTE — Progress Notes (Addendum)
Subjective:    Patient ID: Lance Cook, male    DOB: 03-13-1961, 51 y.o.   MRN: [id number]  HPI Lance Cook is a 51 yo male with a complicated PMH including but not limited to diabetes, CAD, history of aortic valve replacement on warfarin, hyperlipidemia, gout, hypertension, chronic venous stasis, stroke, OSA, and remote testicular cancer who is seen in consultation at the request of Dr. Jonny Ruiz for evaluation of iron deficiency anemia.  The patient reports he has a long-standing history of anemia, and in the past has had evaluation by [facility]. He recalls an upper endoscopy and colonoscopy, but he is uncertain on that date. Records reveal an EGD on 10/29/2009, but I do not have record of prior colonoscopy. After recently seeing his PCP, labs suggested iron deficiency and he was suggested take oral iron. He reports he has been taking this, but not on daily basis. Regarding GI symptoms, he denies upper abdominal pain. No nausea or vomiting. No trouble with heartburn. He does take Nexium, however he takes this intermittently. He reports he will take it 3-4 days at that time and then be off for several weeks. He has not used this for heartburn, but rather when "his stomach is noisy". He denies dysphagia or odynophagia. Regarding his bowel habits, he reports intermittent issue with loose stool. Occasionally his stools are watery. He does report some fecal seepage on occasion with Valsalva such as sneezing. He does report occasional black and tarry stools. In occasion he does see bright red blood per rectum on the toilet tissue and occasionally a drop in the toilet. He reports intermittent burning and stinging with bowel movement in the perianal area. He reports history of hemorrhoids, and he has previously been given a topical cream which he does report helps with the burning and stinging. He occasionally uses Questran approximately once per day, but again not every day. He does report this helps with his  loose stools, and he takes it too frequently can lead to constipation. He occasionally reports lower abdominal cramping and fecal urgency. No fevers or chills.  Review of Systems As per history of present illness, otherwise negative except for chronic lower back pain.  Patient Active Problem List  Diagnoses  . DIABETES MELLITUS, TYPE II  . DYSLIPIDEMIA  . GOUT  . ANEMIA, IRON DEFICIENCY  . ESSENTIAL HYPERTENSION  . MYOCARDIAL INFARCTION, HX OF  . ATRIAL FIBRILLATION, CHRONIC  . INTERNAL HEMORRHOIDS  . Chronic venous hypertension with ulcer  . ULCER, LEG  . DEGENERATIVE JOINT DISEASE, LUMBAR SPINE  . LEG PAIN, LEFT  . MUSCLE CRAMPS  . PERIPHERAL EDEMA  . AORTIC VALVE REPLACEMENT, HX OF  . SPINAL STENOSIS, LUMBAR  . Varicose veins  . CAD (coronary artery disease)  . Preventative health care  . Lumbar spinal stenosis  . Chronic LBP  . CVA (cerebral vascular accident)  . Erectile dysfunction  . Testicular cancer  . TIA (transient ischemic attack)  . OSA (obstructive sleep apnea)   Current Outpatient Prescriptions  Medication Sig Dispense Refill  . allopurinol (ZYLOPRIM) 300 MG tablet Take 1 tablet (300 mg total) by mouth daily.  90 tablet  3  . Ascorbic Acid (VITAMIN C) 1000 MG tablet Take 1,000 mg by mouth daily.        Marland Kitchen atenolol (TENORMIN) 100 MG tablet Take 1 tablet (100 mg total) by mouth daily. Take 100 mg tablet in morning and 1/2 tablet (50mg ) in the evening.  135 tablet  3  . b  complex vitamins tablet Take 1 tablet by mouth daily.        . Calcium-Magnesium-Zinc (CAL-MAG-ZINC PO) Take by mouth daily.        . cholestyramine (QUESTRAN) 4 GM/DOSE powder Take 1 packet (4 g total) by mouth daily as needed.  378 g  3  . COUMADIN 5 MG tablet 1 and 1/2 tab by mouth per day  150 tablet  3  . digoxin (LANOXIN) 0.25 MG tablet Take 1 tablet (250 mcg total) by mouth daily.  90 tablet  3  . diltiazem (CARTIA XT) 120 MG 24 hr capsule Take 1 capsule (120 mg total) by mouth daily.   90 capsule  3  . esomeprazole (NEXIUM) 40 MG capsule Take 1 capsule (40 mg total) by mouth daily.  90 capsule  3  . furosemide (LASIX) 40 MG tablet Take 1 tablet (40 mg total) by mouth daily as needed.  90 tablet  3  . methocarbamol (ROBAXIN) 500 MG tablet Take 1 tablet (500 mg total) by mouth 3 (three) times daily.  270 tablet  3  . niacin (NIASPAN) 500 MG CR tablet Take 1 tablet (500 mg total) by mouth at bedtime.  90 tablet  3  . oxyCODONE-acetaminophen (PERCOCET) 5-325 MG per tablet Take 1 tablet by mouth every 8 (eight) hours as needed for pain. To fill Dec 07, 2011  90 tablet  0  . potassium chloride (KLOR-CON 10) 10 MEQ tablet Take 1 tablet (10 mEq total) by mouth daily.  90 tablet  3  . vitamin E (VITAMIN E) 400 UNIT capsule Take 400 Units by mouth daily.        . Fe Fum-FePoly-Vit C-Vit B3 (INTEGRA) 62.5-62.5-40-3 MG CAPS Take 1 capsule by mouth daily.  30 capsule  2  . tadalafil (CIALIS) 20 MG tablet Take 1 tablet (20 mg total) by mouth daily as needed for erectile dysfunction.  10 tablet  11   Allergies  Allergen Reactions  . Penicillins     REACTION: Hives   Family History  Problem Relation Age of Onset  . Heart disease Mother   . Diabetes Father   . Stroke Other   . Hypertension Other   . Cancer      several aunts ans uncles  . Other Maternal Uncle     brain aneurysm  . Throat cancer Mother    History  Substance Use Topics  . Smoking status: Current Some Day Smoker    Types: Cigars  . Smokeless tobacco: Never Used  . Alcohol Use: No     social       Objective:   Physical Exam BP 128/68  Pulse 64  Ht 5' (1.524 m)  Wt 245 lb 4 oz (111.245 kg)  BMI 47.90 kg/m2 Constitutional: Well-developed and well-nourished. No distress. HEENT: Normocephalic and atraumatic. Oropharynx is clear and moist. No oropharyngeal exudate. Conjunctivae are normal. Pupils are equal round and reactive to light. No scleral icterus. Purplish discoloration lower lip  Neck: Neck supple.  Trachea midline. Cardiovascular: Normal rate, regular rhythm, mechanical S2, 2/6 SEM  Pulmonary/chest: Effort normal and breath sounds normal. No wheezing, rales or rhonchi. Abdominal: Soft, obese, nontender, nondistended. Bowel sounds active throughout. Hepatomegaly noted without splenomegaly, several scattered bruises anterior abdominal wall  Extremities: Purplish discoloration of chronic venous stasis Lymphadenopathy: No cervical adenopathy noted. Neurological: Alert and oriented to person place and time Psychiatric: Normal mood and affect. Behavior is normal.  CBC    Component Value Date/Time   WBC  8.5 10/08/2011 1434   RBC 4.91 10/08/2011 1434   HGB 10.5* 10/08/2011 1434   HCT 33.4* 10/08/2011 1434   PLT 172.0 10/08/2011 1434   MCV 68.2* 10/08/2011 1434   MCHC 31.5 10/08/2011 1434   RDW 19.1* 10/08/2011 1434   LYMPHSABS 1.2 10/08/2011 1434   MONOABS 1.0 10/08/2011 1434   EOSABS 0.3 10/08/2011 1434   BASOSABS 0.0 10/08/2011 1434   Iron/TIBC/Ferritin    Component Value Date/Time   IRON 20* 10/08/2011 1434  % Sat 4.1  CMP     Component Value Date/Time   NA 138 10/08/2011 1434   K 5.1 10/08/2011 1434   CL 102 10/08/2011 1434   CO2 30 10/08/2011 1434   GLUCOSE 83 10/08/2011 1434   BUN 19 10/08/2011 1434   CREATININE 1.0 10/08/2011 1434   CALCIUM 9.2 10/08/2011 1434   PROT 7.0 10/08/2011 1434   ALBUMIN 4.0 10/08/2011 1434   AST 20 10/08/2011 1434   ALT 20 10/08/2011 1434   ALKPHOS 57 10/08/2011 1434   BILITOT 1.4* 10/08/2011 1434   GFRNONAA >60 09/25/2010 1505   GFRAA  Value: >60        The eGFR has been calculated using the MDRD equation. This calculation has not been validated in all clinical situations. eGFR's persistently <60 mL/min signify possible Chronic Kidney Disease. 09/25/2010 1505  Direct bili 0.2  Prior Procedures: EGD, Dr. Beverely Risen, 10/29/09 -- small hiatus hernia, "few red spots distal stomach and antrum", no evidence of active bleeding    Assessment & Plan:  51 yo male with a complicated PMH including but  not limited to diabetes, CAD, history of aortic valve replacement on warfarin, hyperlipidemia, gout, hypertension, chronic venous stasis, stroke, OSA, and remote testicular cancer who is seen in consultation at the request of Dr. Jonny Ruiz for evaluation of iron deficiency anemia.  1. IDA --  the patient describes symptoms suggestive of possible melena. He also is having bright red rectal bleeding along with anorectal discomfort, which is likely consistent with his known hemorrhoidal disease. I would like a copy of asked colonoscopies for completeness. I think that repeating the upper endoscopy and colonoscopy is reasonable given his iron deficiency. He will need likely need a Lovenox bridge, and we will message Dr. Antoine Poche about holding warfarin and bridging with Lovenox to the procedures. Also regarding the anemia, I would like to check a ferritin today. I will also check haptoglobin, LDH. His total bilirubin, specifically the indirect portion, is elevated, would suggest possible homolysis. This could perhaps be due to shearing across his mechanical aortic valve. It is also possible, that chronic shearing could lead to his anemia and perhaps iron deficiency. That being said, I feel it is important to exclude a GI source which intervention may help ameliorate. I would like him to resume iron therapy on a more daily basis. We have discussed this. He is given samples of Integra to take once daily. Also check a celiac panel today.  2. Loose stools/?IBS -- he reports a history of possible IBS. Perhaps this is leading to her loose stools, but first we will exclude melena and GI bleeding which also could lead to loose stools. He could also have bile salt diarrhea/loose stools given his prior cholecystectomy.  3. Hemorrhoids -- he will continue to use his previously prescribed hemorrhoidal cream, which I assume is a steroid cream. He will write the name of this medication down and bring it to his next visit.  Further  recommendations after procedures and  labs.  Addendum: Colonoscopy, date 09/08/2008 -Exam to the cecum, bowel prep was good. Findings 5 mm sessile polyp in the descending colon removed with hot snare. Pathology = hyperplastic.  Pedunculated polyp in the rectum 6 mm in size removed with hot snare. 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

## 2011-12-19 ENCOUNTER — Telehealth: Payer: Self-pay | Admitting: *Deleted

## 2011-12-19 DIAGNOSIS — Z7901 Long term (current) use of anticoagulants: Secondary | ICD-10-CM

## 2011-12-19 NOTE — Telephone Encounter (Signed)
Pt return call back he states he is currently taking 7.5 of coumadin daily. Did inform pt need to come in to have coumadin check. Will come in Monday to have done... 12/19/11@3 :59pm/LMB

## 2011-12-19 NOTE — Telephone Encounter (Signed)
Message copied by Deatra James on Fri Dec 19, 2011 12:21 PM ------      Message from: Corwin Levins      Created: Fri Dec 19, 2011 12:03 PM      Regarding: RE: Anemia       Monitoring the coumadin/INR's was certainly not my understanding from his one and only visit with me feb 6            Lucy to contact pt, needs to start routine coumadin montoring;  Need PT/INR now/asap, as well documentation on chart of exact dose he is currently taking      ----- Message -----         From: Rollene Rotunda, MD         Sent: 12/19/2011   8:55 AM           To: Corwin Levins, MD, Beverley Fiedler, MD      Subject: RE: Jenne Pane,  He should not have hemolysis from this.  Also, I checked about arranging bridging of his anticoagulation and found out that I have not been following his warfarin.  He reports that Dr. Jonny Ruiz has been following it and writing the prescription.  He is not followed in our Coumadin clinic.  It is concerning however that he reports that he only gets it checked every six months.  I will send this to Dr. Jonny Ruiz to alert him.  Leta Jungling            ----- Message -----         From: Beverley Fiedler, MD         Sent: 12/19/2011   8:39 AM           To: Rollene Rotunda, MD      Subject: Anemia                                                   Dub Amis      I saw Mr. Toor in clinic recently for iron def anemia and concern of GI bleeding.  He is on warfarin for mechanical AV.        I plan on EGD and colon (he has a hx of adenomatous polyps), but I checked additional labs due to an elevated indirect bili and it appears he is also having some degree of hemolysis (LDH up some, haptoglobin was undetectable).       Would this be expected for him given his valve?  Can/should anything be done in this regard?      I certainly have seen hemolysis with LVADs, but not always with AVR.        Thanks for your opinion.      Vonna Kotyk Pyrtle

## 2011-12-19 NOTE — Telephone Encounter (Signed)
Called pt gentleman answer would not give name did states he would have him to return call... 12/19/11@12 :24pm/LMB

## 2011-12-23 DIAGNOSIS — Z860101 Personal history of adenomatous and serrated colon polyps: Secondary | ICD-10-CM | POA: Insufficient documentation

## 2011-12-23 DIAGNOSIS — Z860100 Personal history of colon polyps, unspecified: Secondary | ICD-10-CM

## 2011-12-23 HISTORY — DX: Personal history of colonic polyps: Z86.010

## 2011-12-23 HISTORY — DX: Personal history of colon polyps, unspecified: Z86.0100

## 2011-12-24 ENCOUNTER — Telehealth: Payer: Self-pay | Admitting: *Deleted

## 2011-12-24 DIAGNOSIS — IMO0002 Reserved for concepts with insufficient information to code with codable children: Secondary | ICD-10-CM

## 2011-12-24 NOTE — Telephone Encounter (Signed)
Spoke with pt to inform him Dr Rhea Belton would like to refer him to a Hematologist for possible hemolysis. Dr Rhea Belton has spoke with Dr Antoine Poche about the problem and that it may be coming from his mechanical AV. Informed him I didn't want him concerned when he heard the [facility] calling; pt stated understanding.

## 2011-12-24 NOTE — Telephone Encounter (Signed)
Message copied by Florene Glen on Wed Dec 24, 2011  2:44 PM ------      Message from: Beverley Fiedler      Created: Fri Dec 19, 2011  8:38 AM       Hgb is still a bit low, but better than 2 months ago.  This is good news.      Iron stores are low and I still rec EGD and Colon as we discussed in clinic      There is so evidence of hemolysis which may relate to his mechanical AV.  I will message his cardiologist in this regard.      He does need the iron daily like I recommended in clinic.

## 2011-12-24 NOTE — Telephone Encounter (Signed)
This encounter was made to enter the messages from Dr Rhea Belton and Dr Antoine Poche.

## 2011-12-24 NOTE — Telephone Encounter (Signed)
Message copied by Florene Glen on Wed Dec 24, 2011  2:46 PM ------      Message from: Beverley Fiedler      Created: Fri Dec 19, 2011 11:42 AM      Regarding: RE: Anemia       Leta Jungling, thanks.      I will likely refer him to hematology regarding possible hemolysis.      Corrie Mckusick      Please place heme referral: re: anemia with hemolysis      ----- Message -----         From: Rollene Rotunda, MD         Sent: 12/19/2011   8:55 AM           To: Corwin Levins, MD, Beverley Fiedler, MD      Subject: RE: Jenne Pane,  He should not have hemolysis from this.  Also, I checked about arranging bridging of his anticoagulation and found out that I have not been following his warfarin.  He reports that Dr. Jonny Ruiz has been following it and writing the prescription.  He is not followed in our Coumadin clinic.  It is concerning however that he reports that he only gets it checked every six months.  I will send this to Dr. Jonny Ruiz to alert him.  Leta Jungling            ----- Message -----         From: Beverley Fiedler, MD         Sent: 12/19/2011   8:39 AM           To: Rollene Rotunda, MD      Subject: Anemia                                                   Dub Amis      I saw Mr. Verdejo in clinic recently for iron def anemia and concern of GI bleeding.  He is on warfarin for mechanical AV.        I plan on EGD and colon (he has a hx of adenomatous polyps), but I checked additional labs due to an elevated indirect bili and it appears he is also having some degree of hemolysis (LDH up some, haptoglobin was undetectable).       Would this be expected for him given his valve?  Can/should anything be done in this regard?      I certainly have seen hemolysis with LVADs, but not always with AVR.        Thanks for your opinion.      Vonna Kotyk Pyrtle

## 2011-12-24 NOTE — Telephone Encounter (Signed)
Message copied by Florene Glen on Wed Dec 24, 2011  2:54 PM ------      Message from: Beverley Fiedler      Created: Fri Dec 19, 2011 11:42 AM      Regarding: RE: Anemia       Leta Jungling, thanks.      I will likely refer him to hematology regarding possible hemolysis.      Corrie Mckusick      Please place heme referral: re: anemia with hemolysis      ----- Message -----         From: Rollene Rotunda, MD         Sent: 12/19/2011   8:55 AM           To: Corwin Levins, MD, Beverley Fiedler, MD      Subject: RE: Jenne Pane,  He should not have hemolysis from this.  Also, I checked about arranging bridging of his anticoagulation and found out that I have not been following his warfarin.  He reports that Dr. Jonny Ruiz has been following it and writing the prescription.  He is not followed in our Coumadin clinic.  It is concerning however that he reports that he only gets it checked every six months.  I will send this to Dr. Jonny Ruiz to alert him.  Leta Jungling            ----- Message -----         From: Beverley Fiedler, MD         Sent: 12/19/2011   8:39 AM           To: Rollene Rotunda, MD      Subject: Anemia                                                   Dub Amis      I saw Mr. Provencher in clinic recently for iron def anemia and concern of GI bleeding.  He is on warfarin for mechanical AV.        I plan on EGD and colon (he has a hx of adenomatous polyps), but I checked additional labs due to an elevated indirect bili and it appears he is also having some degree of hemolysis (LDH up some, haptoglobin was undetectable).       Would this be expected for him given his valve?  Can/should anything be done in this regard?      I certainly have seen hemolysis with LVADs, but not always with AVR.        Thanks for your opinion.      Vonna Kotyk Pyrtle

## 2011-12-24 NOTE — Telephone Encounter (Signed)
Message copied by Lakeysha Slutsky K on Wed Dec 24, 2011  2:46 PM ------      Message from: PYRTLE, Jerrel M      Created: Fri Dec 19, 2011 11:42 AM      Regarding: RE: Anemia       Jake, thanks.      I will likely refer him to hematology regarding possible hemolysis.      Hazem            Cayden Rautio      Please place heme referral: re: anemia with hemolysis      ----- Message -----         From: James [name], MD         Sent: 12/19/2011   8:55 AM           To: James W John, MD, Kyree M Pyrtle, MD      Subject: RE: Anemia                                               Jamille,  He should not have hemolysis from this.  Also, I checked about arranging bridging of his anticoagulation and found out that I have not been following his warfarin.  He reports that Dr. John has been following it and writing the prescription.  He is not followed in our Coumadin clinic.  It is concerning however that he reports that he only gets it checked every six months.  I will send this to Dr. John to alert him.  Jake            ----- Message -----         From: Anterrio M Pyrtle, MD         Sent: 12/19/2011   8:39 AM           To: James [name], MD      Subject: Anemia                                                   Hi Jake      I saw Mr. Justiss in clinic recently for iron def anemia and concern of GI bleeding.  He is on warfarin for mechanical AV.        I plan on EGD and colon (he has a hx of adenomatous polyps), but I checked additional labs due to an elevated indirect bili and it appears he is also having some degree of hemolysis (LDH up some, haptoglobin was undetectable).       Would this be expected for him given his valve?  Can/should anything be done in this regard?      I certainly have seen hemolysis with LVADs, but not always with AVR.        Thanks for your opinion.      [place] Pyrtle               

## 2011-12-26 ENCOUNTER — Telehealth: Payer: Self-pay | Admitting: Internal Medicine

## 2011-12-26 NOTE — Telephone Encounter (Signed)
Pt called to ask about his hematology referral; he has not heard anything. Informed pt it's probably too soon for an appt. Pt will call me next week if he hasn't heard anything.

## 2011-12-30 ENCOUNTER — Telehealth: Payer: Self-pay | Admitting: *Deleted

## 2011-12-30 NOTE — Telephone Encounter (Signed)
PATIENT CONFIRMED OVER THE PHONE THE NEW DATE AND TIME OF THE APPOINTMENT ON 01-07-2012 STARTING AT 9:15AM

## 2011-12-31 ENCOUNTER — Telehealth: Payer: Self-pay | Admitting: Internal Medicine

## 2011-12-31 NOTE — Telephone Encounter (Signed)
Referred by Dr. Rhea Belton Dx- Hemolysis

## 2012-01-05 ENCOUNTER — Telehealth: Payer: Self-pay | Admitting: Medical Oncology

## 2012-01-05 NOTE — Telephone Encounter (Signed)
Thinks he accidentally hit the appointment reschedule button on his phone for may 8th - but he is keeping his may 8th appointments .

## 2012-01-06 ENCOUNTER — Other Ambulatory Visit: Payer: Self-pay | Admitting: Internal Medicine

## 2012-01-06 DIAGNOSIS — D539 Nutritional anemia, unspecified: Secondary | ICD-10-CM

## 2012-01-06 DIAGNOSIS — D649 Anemia, unspecified: Secondary | ICD-10-CM

## 2012-01-07 ENCOUNTER — Telehealth: Payer: Self-pay | Admitting: Internal Medicine

## 2012-01-07 ENCOUNTER — Ambulatory Visit: Payer: Medicare HMO

## 2012-01-07 ENCOUNTER — Ambulatory Visit (HOSPITAL_BASED_OUTPATIENT_CLINIC_OR_DEPARTMENT_OTHER): Payer: Medicare HMO | Admitting: Internal Medicine

## 2012-01-07 ENCOUNTER — Other Ambulatory Visit: Payer: Self-pay | Admitting: Gastroenterology

## 2012-01-07 ENCOUNTER — Other Ambulatory Visit (HOSPITAL_BASED_OUTPATIENT_CLINIC_OR_DEPARTMENT_OTHER): Payer: Medicare HMO | Admitting: Lab

## 2012-01-07 VITALS — BP 129/63 | HR 62 | Temp 98.9°F | Ht 60.0 in | Wt 245.0 lb

## 2012-01-07 DIAGNOSIS — D509 Iron deficiency anemia, unspecified: Secondary | ICD-10-CM

## 2012-01-07 DIAGNOSIS — D649 Anemia, unspecified: Secondary | ICD-10-CM

## 2012-01-07 DIAGNOSIS — D539 Nutritional anemia, unspecified: Secondary | ICD-10-CM

## 2012-01-07 DIAGNOSIS — Z1211 Encounter for screening for malignant neoplasm of colon: Secondary | ICD-10-CM

## 2012-01-07 LAB — CBC & DIFF AND RETIC
BASO%: 0.4 % (ref 0.0–2.0)
EOS%: 2.2 % (ref 0.0–7.0)
LYMPH%: 16.3 % (ref 14.0–49.0)
MCH: 23.5 pg — ABNORMAL LOW (ref 27.2–33.4)
MCHC: 30.8 g/dL — ABNORMAL LOW (ref 32.0–36.0)
MCV: 76.2 fL — ABNORMAL LOW (ref 79.3–98.0)
MONO%: 7.8 % (ref 0.0–14.0)
NEUT%: 73.3 % (ref 39.0–75.0)
Platelets: 137 10*3/uL — ABNORMAL LOW (ref 140–400)
RBC: 5.71 10*6/uL (ref 4.20–5.82)
Retic %: 1.82 % — ABNORMAL HIGH (ref 0.80–1.80)
WBC: 7.3 10*3/uL (ref 4.0–10.3)

## 2012-01-07 MED ORDER — PEG-KCL-NACL-NASULF-NA ASC-C 100 G PO SOLR: 1.0000 | Freq: Once | ORAL | Status: DC

## 2012-01-07 MED ORDER — INTEGRA 62.5-62.5-40-3 MG PO CAPS: 1.0000 | ORAL_CAPSULE | Freq: Every day | ORAL | Status: DC

## 2012-01-07 NOTE — Progress Notes (Signed)
[facility] [phone]   [phone]  CONSULT NOTE  REASON FOR CONSULTATION:  51 years old white male with persistent anemia.  HPI Lance Cook is a 51 y.o. male was past medical history significant for multiple medical problems including diabetes mellitus, hypertension, coronary artery disease, gout, aortic valve placement and currently on Coumadin, obstructive sleep apnea, history of TIA, dyslipidemia, degenerative joint disease, spinal stenosis as well as history of iron deficiency anemia. The patient also has a history of testicular cancer diagnosed at age 57 status post resection followed by radiotherapy was no evidence for disease recurrence. His aortic valve was a boost with [employer] mechanical valve and he is currently on Coumadin. The patient also has peripheral vascular disease with venous stasis in the lower extremities and occasional bleeding. He also has a history of hemorrhoids. He has a history of easy bruising secondary to Coumadin treatment. The patient also has chronic nosebleed. He denied having any significant dizzy spells but has fatigue and aching pains. He was seen recently by Dr. Rhea Belton with [facility] for evaluation of his anemia. He was found to have a low serum iron. The patient was placed on Integra plus 1 capsule by mouth daily. He was also noted to have elevated total bilirubin and this was concerning for hemolysis. He was referred to me today for evaluation and recommendation regarding his anemia. The patient is married with no children. He is currently on disability. He has no history of smoking but drinks alcohol occasionally. No family history of anemia.    Past Medical History  Diagnosis Date  . Diabetes mellitus   . Hyperlipidemia   . Hypertension   . Arthritis   . Leg pain 06/28/2010  . Hiatal hernia   . Atrial fibrillation   . Reflux 11/06/09  . Myocardial infarction age 76  . Varicose veins   . Obesity     . Peripheral vascular disease   . Gout   . CAD (coronary artery disease)     Old scar inferior wall myoview, 10/2009 EF 52%  . Chronic LBP 10/08/2011  . Impaired glucose tolerance 10/08/2011  . Erectile dysfunction 10/08/2011  . Testicular cancer   . Bell's palsy   . TIA (transient ischemic attack)     age 58  . CVA (cerebral infarction)     51yo  . OSA (obstructive sleep apnea)     CPAP  . Anemia   . History of colon polyps     Past Surgical History  Procedure Date  . Laser ablation 03/06/2010    left leg  . Cardiac valve replacement     aortic valve replaced mechanical age 3 after trauma, SVG to RCA at that time  . Cholecystectomy 2011  . Tonsillectomy 1967  . Otoplasaty     bilateral  . Surgery scrotal / testicular     cancer    Family History  Problem Relation Age of Onset  . Heart disease Mother   . Diabetes Father   . Stroke Other   . Hypertension Other   . Cancer      several aunts ans uncles  . Other Maternal Uncle     brain aneurysm  . Throat cancer Mother     Social History History  Substance Use Topics  . Smoking status: Current Some Day Smoker    Types: Cigars  . Smokeless tobacco: Never Used  . Alcohol Use: No     social  Allergies  Allergen Reactions  . Penicillins     REACTION: Hives    Current Outpatient Prescriptions  Medication Sig Dispense Refill  . allopurinol (ZYLOPRIM) 300 MG tablet Take 1 tablet (300 mg total) by mouth daily.  90 tablet  3  . atenolol (TENORMIN) 100 MG tablet Take 1 tablet (100 mg total) by mouth daily. Take 100 mg tablet in morning and 1/2 tablet (50mg ) in the evening.  135 tablet  3  . b complex vitamins tablet Take 1 tablet by mouth daily.        . Calcium-Magnesium-Zinc (CAL-MAG-ZINC PO) Take by mouth daily.        . cholestyramine (QUESTRAN) 4 GM/DOSE powder Take 1 packet (4 g total) by mouth daily as needed.  378 g  3  . digoxin (LANOXIN) 0.25 MG tablet Take 1 tablet (250 mcg total) by mouth daily.  90  tablet  3  . diltiazem (CARTIA XT) 120 MG 24 hr capsule Take 1 capsule (120 mg total) by mouth daily.  90 capsule  3  . Fe Fum-FePoly-Vit C-Vit B3 (INTEGRA) 62.5-62.5-40-3 MG CAPS Take 1 capsule by mouth daily.  30 capsule  2  . methocarbamol (ROBAXIN) 500 MG tablet Take 1 tablet (500 mg total) by mouth 3 (three) times daily.  270 tablet  3  . oxyCODONE-acetaminophen (PERCOCET) 5-325 MG per tablet Take 1 tablet by mouth every 8 (eight) hours as needed for pain. To fill Dec 07, 2011  90 tablet  0  . Ascorbic Acid (VITAMIN C) 1000 MG tablet Take 1,000 mg by mouth daily.        Marland Kitchen COUMADIN 5 MG tablet 1 and 1/2 tab by mouth per day  150 tablet  3  . esomeprazole (NEXIUM) 40 MG capsule Take 1 capsule (40 mg total) by mouth daily.  90 capsule  3  . furosemide (LASIX) 40 MG tablet Take 1 tablet (40 mg total) by mouth daily as needed.  90 tablet  3  . niacin (NIASPAN) 500 MG CR tablet Take 1 tablet (500 mg total) by mouth at bedtime.  90 tablet  3  . potassium chloride (KLOR-CON 10) 10 MEQ tablet Take 1 tablet (10 mEq total) by mouth daily.  90 tablet  3  . tadalafil (CIALIS) 20 MG tablet Take 1 tablet (20 mg total) by mouth daily as needed for erectile dysfunction.  10 tablet  11  . vitamin E (VITAMIN E) 400 UNIT capsule Take 400 Units by mouth daily.          Review of Systems  A comprehensive review of systems was negative except for: Constitutional: positive for fatigue  Physical Exam  VWU:[id number], healthy, no distress, well nourished and well developed SKIN: skin color, texture, turgor are normal HEAD: Normocephalic, No masses, lesions, tenderness or abnormalities EYES: normal EARS: External ears normal OROPHARYNX:no exudate and no erythema  NECK: supple, no adenopathy LYMPH:  no palpable lymphadenopathy, no hepatosplenomegaly LUNGS: clear to auscultation  HEART: regular rate & rhythm, no murmurs and no gallops ABDOMEN:abdomen soft, non-tender, normal bowel sounds and no masses or  organomegaly BACK: Back symmetric, no curvature. EXTREMITIES:no joint deformities, effusion, or inflammation, no edema, no skin discoloration, no clubbing, no cyanosis  NEURO: alert & oriented x 3 with fluent speech, no focal motor/sensory deficits  PERFORMANCE STATUS: ECOG   Studies/Results: No results found.   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 The patient has significant improvement in his hemoglobin and hematocrit after he was started on Integra plus.   PLAN: I ordered several studies today to evaluate his anemia including repeat CBC, comprehensive metabolic panel, LDH, iron study, serum ferritin, serum vitamin B12, serum folate , haptoglobin, G6PD and hemoglobin electrophoresis. I recommended for the patient to continue his current treatment was Integra plus 1 capsule by mouth daily. I gave him prescription for #30 with 2 refills. I would see him back for followup visit in 2 weeks for evaluation and discussion of his lab results and further recommendation regarding treatment of this condition. I gave the patient the time to ask questions and I answered them completely to his satisfaction.  All questions were answered. The patient knows to call the clinic with any problems, questions or concerns. We can certainly see the patient much sooner if necessary.  Thank you so much for allowing me to participate in the care of Lance Cook. I will continue to follow up the patient with you and assist in his care.  I spent 25 minutes counseling the patient face to face. The total time spent in the appointment was 50 minutes.   Nikola Marone K. 01/07/2012, 11:08 AM

## 2012-01-07 NOTE — Telephone Encounter (Signed)
gve the pt his may 2013 appt calendar °

## 2012-01-09 ENCOUNTER — Telehealth: Payer: Self-pay | Admitting: Medical Oncology

## 2012-01-09 ENCOUNTER — Telehealth: Payer: Self-pay | Admitting: Internal Medicine

## 2012-01-09 ENCOUNTER — Telehealth: Payer: Self-pay | Admitting: Gastroenterology

## 2012-01-09 ENCOUNTER — Telehealth: Payer: Self-pay | Admitting: *Deleted

## 2012-01-09 ENCOUNTER — Other Ambulatory Visit: Payer: Self-pay | Admitting: Gastroenterology

## 2012-01-09 DIAGNOSIS — Z7901 Long term (current) use of anticoagulants: Secondary | ICD-10-CM

## 2012-01-09 LAB — COMPREHENSIVE METABOLIC PANEL
ALT: 19 U/L (ref 0–53)
AST: 19 U/L (ref 0–37)
Alkaline Phosphatase: 56 U/L (ref 39–117)
CO2: 31 mEq/L (ref 19–32)
Sodium: 140 mEq/L (ref 135–145)
Total Bilirubin: 0.7 mg/dL (ref 0.3–1.2)
Total Protein: 6.8 g/dL (ref 6.0–8.3)

## 2012-01-09 LAB — HAPTOGLOBIN: Haptoglobin: <25 mg/dL — ABNORMAL LOW (ref 45–215)

## 2012-01-09 LAB — FERRITIN: Ferritin: 50 ng/mL (ref 22–322)

## 2012-01-09 LAB — IRON AND TIBC
%SAT: 27 % (ref 20–55)
Iron: 106 ug/dL (ref 42–165)
UIBC: 285 ug/dL (ref 125–400)

## 2012-01-09 LAB — HEMOGLOBINOPATHY EVALUATION
Hemoglobin Other: 0 %
Hgb A: 97.7 % (ref 96.8–97.8)

## 2012-01-09 LAB — DIRECT ANTIGLOBULIN TEST (NOT AT ARMC)
DAT (Complement): NEGATIVE
DAT IgG: NEGATIVE

## 2012-01-09 MED ORDER — ENOXAPARIN SODIUM 120 MG/0.8ML ~~LOC~~ SOLN: 120.0000 mg | Freq: Every day | SUBCUTANEOUS | Status: DC

## 2012-01-09 NOTE — Telephone Encounter (Signed)
I see where there has some confusion on this point as originally Dr Rhea Belton wanted the input of Dr Antoine Poche regarding the coumadin but Dr Shirline Frees is currently managing, and is apparently not available for the question at this time  Pt absolutely needs to have Lovenox bridge when off the coumadin, due the mechanical Aortic Valve and increased risk of blood clot, stroke and other embolism  Pt needs to start Lovenox immediately (asap today) at 120 mg sq qd for today NOW, Saturday in the AM, and Sunday in the AM   If the pt is not able to obtain the lovenox NOW. He should re-start coumadin as before, and inform GI that the procedure should be cancelled for MON, with re-scheduling to be done with holding coumadin/lovenox bridge

## 2012-01-09 NOTE — Telephone Encounter (Signed)
Further review of chart indicates that I had been advised of pt noncompliance with INR /coumadin check apr 19, pt advised by me to come for INR, to which he did not comply  Robin to notify pt;  He is absolutely needing to get regular INR evaluation at least every 4 weeks  If he is unwilling to observe this medical recommendation, I cannot remain his Primary Care physician and will withdraw from his care, as this is a very serious issue given the risk of blood clots with his Aortic Valve, and risk of bleeding if the INR becomes too high  He should have INR done asap - preferably Monday Apr 13

## 2012-01-09 NOTE — Telephone Encounter (Signed)
Called pt to verify that Dr, Gwenyth Bouillon was managing his coumadin.  Called Dr. Sharlene Dory office spoke to his nurse who said Dr. Gwenyth Bouillon was not in but she will find out before business close today whether or not pt needs to hold coumadin/needs Lovonox bridge.

## 2012-01-09 NOTE — Telephone Encounter (Signed)
Per pt, no one has been managing his coumadin in months. He says that it was previously done at [facility] but that was mid 2012. Pt says that pharmacy called stating Lovenox will not be available until Monday and pt refused to have Rx sent to another pharmacy because he could not pay.  Per JWJ, pt advised to restart coumadin at regular dosage tonight (he states that his last dose was last night) and reschedule colonoscopy (scheduled for Tuesday, not Monday). Pt also advised to come in for PT as soon as possible. Pt agreed and expressed understanding. Stacy CMA in [facility] advised of same.

## 2012-01-09 NOTE — Telephone Encounter (Addendum)
Pt told GI that Dr Donnald Garre is managing his coumadin. Does he need to hold it for 5 days prior to procedure Tuesday and if so does he need Lovenox bridge?.  I consulted Dr Arbutus Ped and he said he does not manage his coumadin -pt has aortic valve . I called this to [facility]

## 2012-01-09 NOTE — Telephone Encounter (Signed)
Spoke to Diane, Dr. Sharlene Dory nurse and said they do not manage pt's Coumadin. Yesterday 01/08/2012 was the first time pt was seen by Dr. Gwenyth Bouillon. I called pt's PCP dr. Jonny Ruiz and spoke to his nurse, since Rx was last written by Dr. Jonny Ruiz to find out about Coumadin hold.  She said she will call us back.

## 2012-01-09 NOTE — Telephone Encounter (Signed)
01/09/2012    RE: Lance Cook DOB: [birth year] MRN: [id number]   Dear Dr. Antoine Poche,    We have scheduled the above patient for an endoscopic procedure. Our records show that he is on anticoagulation therapy.   Please advise as to how long the patient may come off his therapy of Warfrin prior to the procedure, which is scheduled for 01/13/2012.  Please fax back/ or route the completed form to [facility] or Aram Beecham at [phone].   Sincerely,  [name], [name]

## 2012-01-09 NOTE — Telephone Encounter (Signed)
Pt is scheduled for colonoscopy on Monday and has not taken his coumadin for the past couple of days-they want to know if it is okay for pt to continue to hold Coumadin.

## 2012-01-12 ENCOUNTER — Telehealth: Payer: Self-pay | Admitting: Internal Medicine

## 2012-01-12 ENCOUNTER — Other Ambulatory Visit (INDEPENDENT_AMBULATORY_CARE_PROVIDER_SITE_OTHER): Payer: Medicare HMO

## 2012-01-12 DIAGNOSIS — Z7901 Long term (current) use of anticoagulants: Secondary | ICD-10-CM

## 2012-01-12 LAB — PROTIME-INR: INR: 4.2 ratio — ABNORMAL HIGH (ref 0.8–1.0)

## 2012-01-12 MED ORDER — ENOXAPARIN SODIUM 120 MG/0.8ML ~~LOC~~ SOLN: 120.0000 mg | Freq: Every day | SUBCUTANEOUS | Status: DC

## 2012-01-12 NOTE — Telephone Encounter (Signed)
INR is 4.2  Pt to hold coumadin tues may 14  Take regular dose coumadin wed May 15 only (then hold until after colonoscopy)  To start lovenox 120 mg sq qd on may 16 - 20 in the AM (last dose on the 20th)  - done escript - 5 day total  Colonoscopy is May 21 (to take no coumadin or lovenox that day)  Re-start Coumadin usual dosing May 22 with f/u INR 7 days  Robin to inform pt, and arrange future lab

## 2012-01-12 NOTE — Telephone Encounter (Signed)
The patient did agree to come in today for INR. Patient informed colonoscopy is scheduled for the 21st. Please advise on INR results when ready and also exact instructions on when to start lovenox. Also the patient would like to know if an antibiotic is necessary before procedure

## 2012-01-12 NOTE — Telephone Encounter (Signed)
No need for antibiotic.

## 2012-01-13 ENCOUNTER — Encounter: Payer: Medicare HMO | Admitting: Internal Medicine

## 2012-01-13 NOTE — Telephone Encounter (Signed)
Pt advised in detail and requested to make note of directions. Pt stated that he has written instruction per MD and that he understood. Lovenox Rx are ready for pt pick up.

## 2012-01-13 NOTE — Telephone Encounter (Signed)
Called the patient left message to call back 

## 2012-01-13 NOTE — Telephone Encounter (Signed)
Called the patient left detailed message to call back to get instructions on coumadin and lovenox

## 2012-01-19 ENCOUNTER — Telehealth: Payer: Self-pay | Admitting: Internal Medicine

## 2012-01-19 ENCOUNTER — Telehealth: Payer: Self-pay | Admitting: Medical Oncology

## 2012-01-19 NOTE — Telephone Encounter (Signed)
l/m with 6/4 appt

## 2012-01-19 NOTE — Telephone Encounter (Signed)
Cannot make appointment on 5/21 due to colonoscopy. Please reschedule for 2 weeks -Schedule request sent. He asked for lab results and I told him Dr Donnald Garre will review at next appointment- pt voices understanding.

## 2012-01-20 ENCOUNTER — Telehealth: Payer: Self-pay | Admitting: *Deleted

## 2012-01-20 ENCOUNTER — Other Ambulatory Visit: Payer: Medicare HMO | Admitting: Lab

## 2012-01-20 ENCOUNTER — Ambulatory Visit: Payer: Medicare HMO | Admitting: Internal Medicine

## 2012-01-20 ENCOUNTER — Encounter: Payer: Self-pay | Admitting: Internal Medicine

## 2012-01-20 ENCOUNTER — Ambulatory Visit (AMBULATORY_SURGERY_CENTER): Payer: Medicare HMO | Admitting: Internal Medicine

## 2012-01-20 VITALS — BP 145/89 | HR 59 | Temp 99.3°F | Resp 16 | Ht 60.0 in | Wt 245.0 lb

## 2012-01-20 DIAGNOSIS — D126 Benign neoplasm of colon, unspecified: Secondary | ICD-10-CM

## 2012-01-20 DIAGNOSIS — D131 Benign neoplasm of stomach: Secondary | ICD-10-CM

## 2012-01-20 DIAGNOSIS — D596 Hemoglobinuria due to hemolysis from other external causes: Secondary | ICD-10-CM

## 2012-01-20 DIAGNOSIS — Z86010 Personal history of colonic polyps: Secondary | ICD-10-CM

## 2012-01-20 DIAGNOSIS — K317 Polyp of stomach and duodenum: Secondary | ICD-10-CM

## 2012-01-20 DIAGNOSIS — K2990 Gastroduodenitis, unspecified, without bleeding: Secondary | ICD-10-CM

## 2012-01-20 DIAGNOSIS — K2970 Gastritis, unspecified, without bleeding: Secondary | ICD-10-CM

## 2012-01-20 DIAGNOSIS — K635 Polyp of colon: Secondary | ICD-10-CM

## 2012-01-20 DIAGNOSIS — Z1211 Encounter for screening for malignant neoplasm of colon: Secondary | ICD-10-CM

## 2012-01-20 DIAGNOSIS — D509 Iron deficiency anemia, unspecified: Secondary | ICD-10-CM

## 2012-01-20 LAB — GLUCOSE, CAPILLARY
Glucose-Capillary: 80 mg/dL (ref 70–99)
Glucose-Capillary: 91 mg/dL (ref 70–99)

## 2012-01-20 MED ORDER — SODIUM CHLORIDE 0.9 % IV SOLN: 500.0000 mL | INTRAVENOUS | Status: DC

## 2012-01-20 NOTE — Op Note (Signed)
[facility] [address] [place], [place]  [address]  ENDOSCOPY PROCEDURE REPORT  PATIENT:  Lance Cook, Lance Cook  MR#:  [id number] BIRTHDATE:  Aug 02, 1961, 50 yrs. old  GENDER:  male ENDOSCOPIST:  Kolton Kienle. Felicia Both, MD Referred by:  Oliver Barre, M.D. PROCEDURE DATE:  01/20/2012 PROCEDURE:  EGD with biopsy, 43239 ASA CLASS:  Class III INDICATIONS:  iron deficiency anemia MEDICATIONS:   MAC sedation, administered by CRNA, propofol (Diprivan) 130 mg IV TOPICAL ANESTHETIC:  none  DESCRIPTION OF PROCEDURE:   After the risks benefits and alternatives of the procedure were thoroughly explained, informed consent was obtained.  The  endoscope was introduced through the mouth and advanced to the second portion of the duodenum, without limitations.  The instrument was slowly withdrawn as the mucosa was fully examined. <<PROCEDUREIMAGES>>  The esophagus and gastroesophageal junction were completely normal in appearance.  Mild gastritis was found antrum. Biopsies of the antrum and body of the stomach were obtained and sent to pathology.  Three polyps were found in the body of the stomach. Multiple biopsies were obtained (each polyp was sampled) and sent to pathology.  A small hiatal hernia was found.  The duodenal bulb was normal in appearance, as was the postbulbar duodenum. Random biopsies were obtained and sent to pathology.    Retroflexed views revealed a hiatal hernia.    The scope was then withdrawn from the patient and the procedure completed.  COMPLICATIONS:  None  ENDOSCOPIC IMPRESSION: 1) Normal esophagus 2) Mild gastritis in the antrum.  Multiple biopsies obtained and sent to pathology. 3) Three polyps in the body of the stomach.  Sampled with cold forceps and sent to pathology. 4) Small hiatal hernia 5) Normal duodenum. Multiple biopsies obtained and sent to pathology.  RECOMMENDATIONS: 1) Await pathology results 2) Avoid NSAIDS 3) Continue current medications 4)  Follow-up of helicobacter pylori status, treat if indicated 5) Continue taking your PPI (antiacid medicine) once daily. It is best to be taken 20-30 minutes prior to breakfast meal. 6) Can resume lovenox with tomorrow mornings dose, and can resume warfarin tonight.  Carie Caddy. Rhea Belton, MD  CC:  The Patient Corwin Levins, MD Rollene Rotunda, MD  n. Rosalie Doctor:   Carie Caddy. Bari Leib at 01/20/2012 03:17 PM  [name], [phone]

## 2012-01-20 NOTE — Progress Notes (Signed)
Advised Dr. Rhea Belton that pt. Blood sugar 80.  No orders given.  Hung D5 W per protocol.

## 2012-01-20 NOTE — Telephone Encounter (Signed)
Spoke with [place] and scheduled patient with Dr. Jamey Ripa on 02/06/12 with 11:00 AM arrival for 11:30 AM OV. (first available)

## 2012-01-20 NOTE — Op Note (Signed)
[facility] [address] [place], [place]  [address]  COLONOSCOPY PROCEDURE REPORT  PATIENT:  Lance Cook, Lance Cook  MR#:  [id number] BIRTHDATE:  Dec 08, 1960, 50 yrs. old  GENDER:  male ENDOSCOPIST:  Yaroslav Gombos. Rondall Radigan, MD REF. BY:  Oliver Barre, M.D. PROCEDURE DATE:  01/20/2012 PROCEDURE:  Colonoscopy with snare polypectomy ASA CLASS:  Class III INDICATIONS:  Iron Deficiency Anemia, history of pre-cancerous (adenomatous) colon polyps, last colonoscopy 2010 MEDICATIONS:   MAC sedation, administered by CRNA, propofol (Diprivan) 150 mg IV  DESCRIPTION OF PROCEDURE:   After the risks benefits and alternatives of the procedure were thoroughly explained, informed consent was obtained.  Digital rectal exam was performed and revealed no rectal masses.   The LB [id number] endoscope was introduced through the anus and advanced to the terminal ileum which was intubated for a short distance, without limitations. The quality of the prep was good, using MoviPrep.  The instrument was then slowly withdrawn as the colon was fully examined. <<PROCEDUREIMAGES>>  FINDINGS:  The terminal ileum appeared normal.  Moderate diverticulosis was found ascending colon to sigmoid colon.  Three sessile polyps were found in the rectum, ranging in size from 3 mm to 7 mm. Polyps were snared, then cauterized with monopolar cautery. Retrieval was successful.  A small, nonbleeding, angioectasia was found in the proximal transverse colon. Retroflexed views in the rectum revealed perianal skin tag and possible polyp at dentate line. The scope was then withdrawn from the cecum and the procedure completed.  COMPLICATIONS:  None ENDOSCOPIC IMPRESSION: 1) Normal terminal ileum 2) Moderate diverticulosis ascending colon to sigmoid colon 3) Angioectasia without bleeding in the proximal transverse colon 4) 3 sessile polyps in the rectum. Multiple biopsies obtained and sent to pathology. 5) Perianal skin tag and  possible pedunculated polyp at the dentate line.  RECOMMENDATIONS: 1) Hold aspirin, aspirin products, and anti-inflammatory medication for 2 weeks. 2) Await pathology results 3) High fiber diet. 4) If the polyps removed today are proven to be adenomatous (pre-cancerous) polyps, you will need a colonoscopy in 3 years. You will receive a letter within 1-2 weeks with the results of your biopsy as well as final recommendations. Please call my office if you have not received a letter after 3 weeks. 5) Surgical consultation for evaluation of possible polyp at dentate line. 6) Can resume lovenox with tomorrow morning's dose, and can resume warfarin with tonight's dose. 7) Continue supplemental iron, and monitoring of Hgb and HCT.  Carie Caddy. Rhea Belton, MD  CC:  Corwin Levins, MD Rollene Rotunda, MD The Patient  n. eSIGNED:   Marris Frontera. Kashauna Celmer at 01/20/2012 03:28 PM  [name], [phone]

## 2012-01-20 NOTE — Progress Notes (Signed)
Patient did not experience any of the following events: a burn prior to discharge; a fall within the facility; wrong site/side/patient/procedure/implant event; or a hospital transfer or hospital admission upon discharge from the facility. (G8907) Patient did not have preoperative order for IV antibiotic SSI prophylaxis. (G8918)  

## 2012-01-20 NOTE — Patient Instructions (Addendum)
YOU HAD AN ENDOSCOPIC PROCEDURE TODAY AT THE [facility]: Refer to the procedure report that was given to you for any specific questions about what was found during the examination.  If the procedure report does not answer your questions, please call your gastroenterologist to clarify.  If you requested that your care partner not be given the details of your procedure findings, then the procedure report has been included in a sealed envelope for you to review at your convenience later.  YOU SHOULD EXPECT: Some feelings of bloating in the abdomen. Passage of more gas than usual.  Walking can help get rid of the air that was put into your GI tract during the procedure and reduce the bloating. If you had a lower endoscopy (such as a colonoscopy or flexible sigmoidoscopy) you may notice spotting of blood in your stool or on the toilet paper. If you underwent a bowel prep for your procedure, then you may not have a normal bowel movement for a few days.  DIET: Your first meal following the procedure should be a light meal and then it is ok to progress to your normal diet.  A half-sandwich or bowl of soup is an example of a good first meal.  Heavy or fried foods are harder to digest and may make you feel nauseous or bloated.  Likewise meals heavy in dairy and vegetables can cause extra gas to form and this can also increase the bloating.  Drink plenty of fluids but you should avoid alcoholic beverages for 24 hours.  ACTIVITY: Your care partner should take you home directly after the procedure.  You should plan to take it easy, moving slowly for the rest of the day.  You can resume normal activity the day after the procedure however you should NOT DRIVE or use heavy machinery for 24 hours (because of the sedation medicines used during the test).    SYMPTOMS TO REPORT IMMEDIATELY: A gastroenterologist can be reached at any hour.  During normal business hours, 8:30 AM to 5:00 PM Monday through Friday,  call [phone].  After hours and on weekends, please call the GI answering service at [phone] who will take a message and have the physician on call contact you.   Following lower endoscopy (colonoscopy or flexible sigmoidoscopy):  Excessive amounts of blood in the stool  Significant tenderness or worsening of abdominal pains  Swelling of the abdomen that is new, acute  Fever of 100F or higher  Following upper endoscopy (EGD)  Vomiting of blood or coffee ground material  New chest pain or pain under the shoulder blades  Painful or persistently difficult swallowing  New shortness of breath  Fever of 100F or higher  Black, tarry-looking stools  FOLLOW UP: If any biopsies were taken you will be contacted by phone or by letter within the next 1-3 weeks.  Call your gastroenterologist if you have not heard about the biopsies in 3 weeks.  Our staff will call the home number listed on your records the next business day following your procedure to check on you and address any questions or concerns that you may have at that time regarding the information given to you following your procedure. This is a courtesy call and so if there is no answer at the home number and we have not heard from you through the emergency physician on call, we will assume that you have returned to your regular daily activities without incident.  SIGNATURES/CONFIDENTIALITY: You and/or your care   partner have signed paperwork which will be entered into your electronic medical record.  These signatures attest to the fact that that the information above on your After Visit Summary has been reviewed and is understood.  Full responsibility of the confidentiality of this discharge information lies with you and/or your care-partner.      INFORMATION ON GASTRITIS, HIATAL HERNIA,    AVOID NSAIDS & ASPIRIN PRODUCTS FOR 2 WEEKS  CONTINUE YOUR ANTACID MEDICATION DAILY 20-30 MIN BEFORE BREAKFAST MEAL  RESUME LOVENOX  WITH TOMORROW MORNING DOSE AND RESUME WARFARIN TONIGHT.  CONTINUE SUPPLEMENT IRON AND MONITORING BLOOD COUNTS   INFORMATION ON POLYPS GIVEN TO YOU TODAY  SURGICAL CONSULTATION WILL BE DONE BY DR PYRTLE'S OFFICE NURSE FOR POLYP @ DENTATE LINE VS SKIN TAG   INTEGRA SAMPLES GIVEN TO YOU BY DR PYRTLE, MAY TAKE ONE INTEGRA DAILY.

## 2012-01-21 ENCOUNTER — Telehealth: Payer: Self-pay | Admitting: *Deleted

## 2012-01-21 NOTE — Telephone Encounter (Signed)
  Follow up Call-  Call back number 01/20/2012  Post procedure Call Back phone  # [phone]  Permission to leave phone message Yes     Patient questions:  Do you have a fever, pain , or abdominal swelling? no Pain Score  0 *  Have you tolerated food without any problems? yes  Have you been able to return to your normal activities? yes  Do you have any questions about your discharge instructions: Diet   no Medications  no Follow up visit  no  Do you have questions or concerns about your Care? no  Actions: * If pain score is 4 or above: No action needed, pain <4.

## 2012-01-21 NOTE — Telephone Encounter (Signed)
Notified pt of his surgical appt; mailed him directions with appt reminder.

## 2012-01-30 ENCOUNTER — Encounter: Payer: Self-pay | Admitting: Internal Medicine

## 2012-02-03 ENCOUNTER — Other Ambulatory Visit (HOSPITAL_BASED_OUTPATIENT_CLINIC_OR_DEPARTMENT_OTHER): Payer: Medicare HMO | Admitting: Lab

## 2012-02-03 ENCOUNTER — Ambulatory Visit (HOSPITAL_BASED_OUTPATIENT_CLINIC_OR_DEPARTMENT_OTHER): Payer: Medicare HMO | Admitting: Internal Medicine

## 2012-02-03 VITALS — BP 137/82 | HR 62 | Temp 97.7°F | Ht 60.0 in | Wt 241.6 lb

## 2012-02-03 DIAGNOSIS — D509 Iron deficiency anemia, unspecified: Secondary | ICD-10-CM

## 2012-02-03 DIAGNOSIS — D649 Anemia, unspecified: Secondary | ICD-10-CM

## 2012-02-03 LAB — CBC WITH DIFFERENTIAL/PLATELET
BASO%: 1 % (ref 0.0–2.0)
Basophils Absolute: 0.1 10*3/uL (ref 0.0–0.1)
EOS%: 2.5 % (ref 0.0–7.0)
HCT: 41.5 % (ref 38.4–49.9)
HGB: 13.2 g/dL (ref 13.0–17.1)
MCH: 25.4 pg — ABNORMAL LOW (ref 27.2–33.4)
MCHC: 31.9 g/dL — ABNORMAL LOW (ref 32.0–36.0)
MONO#: 0.5 10*3/uL (ref 0.1–0.9)
NEUT%: 72.4 % (ref 39.0–75.0)
RDW: 23.4 % — ABNORMAL HIGH (ref 11.0–14.6)
WBC: 6.2 10*3/uL (ref 4.0–10.3)
lymph#: 1 10*3/uL (ref 0.9–3.3)
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
## 2012-02-06 ENCOUNTER — Encounter (INDEPENDENT_AMBULATORY_CARE_PROVIDER_SITE_OTHER): Payer: Medicare HMO | Admitting: Surgery

## 2012-02-23 ENCOUNTER — Telehealth: Payer: Self-pay | Admitting: Cardiology

## 2012-02-23 NOTE — Telephone Encounter (Signed)
Pt needs a letter to get another C-PAP machine because his is breaking and working well
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
## 2012-02-24 ENCOUNTER — Telehealth: Payer: Self-pay

## 2012-02-24 NOTE — Telephone Encounter (Signed)
Phone call from pt. to ask if Dr. Arbie Cookey would order special compression device for lymphedema?   States has been researching lymphedema on the internet and feels he has this.  Reports an "ulcer that has opened on left interior calf just above ankle".  States that a clear fluid pours out of this.  States has not been able to wear compression stockings due to the size if legs.  States the constriction causes areas to get sore due to very fragile skin.  Advised pt. He would need to schedule appt.  with Dr. Arbie Cookey for evaluation of symptoms/new ulcer.  Advised pt. If lymphedema diagnosed, then he may be referred to a Lymphedema specialist. Advised pt. Dr. Arbie Cookey on vacation and appt may not be avail for a couple weeks.  Stated he has an artificial heart valve, and voiced concern of "infection and sepsis setting-in."  Denies any redness/inflammation at this time, but states "my leg gets hot at times."  Advised will have schedulers call him tomorrow to set-up appt.  Pt. requests appt. ASAP.

## 2012-02-24 NOTE — Telephone Encounter (Signed)
I have paperwork from [employer] requesting an order for CPAP - Dr Antoine Poche does not order CPAP.  I will forward the paperwork to him for recommendations.

## 2012-02-25 ENCOUNTER — Telehealth: Payer: Self-pay | Admitting: Vascular Surgery

## 2012-02-25 NOTE — Telephone Encounter (Signed)
Appt. scheduled for pt. at 10:00 am 03/02/12/ pt. made aware.

## 2012-02-25 NOTE — Telephone Encounter (Signed)
Please refer to the prescribing MD.

## 2012-02-25 NOTE — Telephone Encounter (Signed)
Per Carol's telephone message, I scheduled an appointment for the patient on July 2nd at 10:00 due to an ulcer with possibility of becoming infected. The patient called right back and said he needed to reschedule the appointment to a later time due to a conflict. I informed the patient that we worked with the schedule to make his current appointment available and that Dr. Arbie Cookey will be on vacation the following week. I told him that the next available appointment would be on 03/16/12, he said that he was worried that the ulcer would become infected by then. I told him that I understood his concern and asked if there was any way he could rearrange his prior engagement to make the appointment work on 03/06/12, he stated that he has a legal obligation and can not miss it, so I reschedule to 7/16 and told him to call before hand if he has any problems.

## 2012-02-26 ENCOUNTER — Telehealth: Payer: Self-pay | Admitting: Cardiology

## 2012-02-26 ENCOUNTER — Telehealth: Payer: Self-pay | Admitting: Internal Medicine

## 2012-02-26 DIAGNOSIS — G4733 Obstructive sleep apnea (adult) (pediatric): Secondary | ICD-10-CM

## 2012-02-26 NOTE — Telephone Encounter (Signed)
I dont manage OSA  I can refer to pulm - done per emr  Robin to let pt know
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
## 2012-02-26 NOTE — Telephone Encounter (Signed)
Patient request return call at [phone] concerning edema and sleep apnea machine.

## 2012-02-26 NOTE — Telephone Encounter (Signed)
[employer] calling back, gave them message, will call his referring md

## 2012-02-27 ENCOUNTER — Telehealth: Payer: Self-pay

## 2012-02-27 NOTE — Telephone Encounter (Signed)
Spoke with pt. Advised we will not be signing a letter for him until p he is an est pt with one of our docs. Pt verbalized understanding. Consult with CDY scheduled for July 5th at 11 am. Nothing further needed.

## 2012-02-27 NOTE — Telephone Encounter (Signed)
Informed TD of below situation. Per Tammy D, we will NOT be able to do the letter or sign the form for pt.  He has not been seen by any of our pulmonary drs.  We need to call him back to inform him of this and defer to PCP until seen by pulmonary.

## 2012-02-27 NOTE — Telephone Encounter (Signed)
Called the patient left message to call back 
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
## 2012-02-27 NOTE — Telephone Encounter (Signed)
Patient informed of referral

## 2012-02-29 NOTE — Telephone Encounter (Signed)
The patient has an appt with Dr. Maple Hudson on Friday.

## 2012-03-02 ENCOUNTER — Encounter (INDEPENDENT_AMBULATORY_CARE_PROVIDER_SITE_OTHER): Payer: Medicare HMO | Admitting: Surgery

## 2012-03-02 ENCOUNTER — Ambulatory Visit: Payer: Medicare HMO | Admitting: Vascular Surgery

## 2012-03-05 ENCOUNTER — Encounter: Payer: Self-pay | Admitting: Internal Medicine

## 2012-03-05 ENCOUNTER — Ambulatory Visit (INDEPENDENT_AMBULATORY_CARE_PROVIDER_SITE_OTHER): Payer: Medicare HMO | Admitting: Internal Medicine

## 2012-03-05 VITALS — BP 130/60 | HR 68 | Ht 61.5 in | Wt 242.6 lb

## 2012-03-05 DIAGNOSIS — I87319 Chronic venous hypertension (idiopathic) with ulcer of unspecified lower extremity: Secondary | ICD-10-CM

## 2012-03-05 DIAGNOSIS — L97909 Non-pressure chronic ulcer of unspecified part of unspecified lower leg with unspecified severity: Secondary | ICD-10-CM

## 2012-03-05 DIAGNOSIS — G4733 Obstructive sleep apnea (adult) (pediatric): Secondary | ICD-10-CM

## 2012-03-05 NOTE — Patient Instructions (Addendum)
Order- split protocol NPSG   Dx OSA 

## 2012-03-05 NOTE — Progress Notes (Signed)
03/05/12- 50 yoM smoker referred by Dr. Jonny Ruiz for sleep apnea. Here with his wife. He is a disabled optician with an extensive medical history. He had a diagnostic sleep study at his cardiology office/[facility] several years ago. He says his pressure is "11 auto" and that previously he was using BiPAP 18/13. His insurance ran out and he lost track of his DME company. He has been using CPAP all night every night but his machine now cuts on and off for several times each night. He thinks it is about 51 years old. Bedtime 8 to 10 PM, 30 minutes sleep latency, waking 3-5 times during the night before waking around 5 AM. His weight is down around 10 pounds. He has had no ENT surgery. Extensive cardiovascular history includes hypertension, atrial fibrillation/Coumadin, CVA, aortic valve repair/St. Jude valve 25 years ago, coronary bypass graft. He has severe chronic lymphedema of the lower extremities and says he has been told by vascular surgeon that they can't help. Other medical history includes diabetes, testicular cancer.  Prior to Admission medications   Medication Sig Start Date End Date Taking? Authorizing Provider  allopurinol (ZYLOPRIM) 300 MG tablet Take 1 tablet (300 mg total) by mouth daily. 10/08/11  Yes Corwin Levins, MD  Ascorbic Acid (VITAMIN C) 1000 MG tablet Take 1,000 mg by mouth daily.     Yes Historical Provider, MD  atenolol (TENORMIN) 100 MG tablet Take 1 tablet (100 mg total) by mouth daily. Take 100 mg tablet in morning and 1/2 tablet (50mg ) in the evening. 10/08/11  Yes Corwin Levins, MD  b complex vitamins tablet Take 1 tablet by mouth daily.     Yes Historical Provider, MD  Calcium-Magnesium-Zinc (CAL-MAG-ZINC PO) Take by mouth daily.     Yes Historical Provider, MD  COUMADIN 5 MG tablet 1 and 1/2 tab by mouth per day 10/08/11 10/07/13 Yes Corwin Levins, MD  digoxin (LANOXIN) 0.25 MG tablet Take 1 tablet (250 mcg total) by mouth daily. 10/08/11 10/07/12 Yes Corwin Levins, MD  diltiazem (CARTIA XT) 120 MG  24 hr capsule Take 1 capsule (120 mg total) by mouth daily. 10/08/11  Yes Corwin Levins, MD  esomeprazole (NEXIUM) 40 MG capsule Take 1 capsule (40 mg total) by mouth daily. 10/08/11  Yes Corwin Levins, MD  Fe Fum-FePoly-Vit C-Vit B3 (INTEGRA) 62.5-62.5-40-3 MG CAPS Take 1 capsule by mouth daily. 01/07/12  Yes Si Gaul, MD  methocarbamol (ROBAXIN) 500 MG tablet Take 1 tablet (500 mg total) by mouth 3 (three) times daily. 10/08/11  Yes Corwin Levins, MD  oxyCODONE-acetaminophen (PERCOCET) 5-325 MG per tablet Take 1 tablet by mouth every 8 (eight) hours as needed for pain. To fill Dec 07, 2011 10/08/11  Yes Corwin Levins, MD  potassium chloride (KLOR-CON 10) 10 MEQ tablet Take 1 tablet (10 mEq total) by mouth daily. 10/08/11 10/07/12 Yes Corwin Levins, MD  vitamin E (VITAMIN E) 400 UNIT capsule Take 400 Units by mouth daily.     Yes Historical Provider, MD  furosemide (LASIX) 40 MG tablet Take 1 tablet (40 mg total) by mouth daily as needed. 10/08/11   Corwin Levins, MD   Past Medical History  Diagnosis Date  . Diabetes mellitus   . Hyperlipidemia   . Hypertension   . Arthritis   . Leg pain 06/28/2010  . Hiatal hernia   . Atrial fibrillation   . Reflux 11/06/09  . Myocardial infarction age 55  . Varicose veins   .  Obesity   . Peripheral vascular disease   . Gout   . CAD (coronary artery disease)     Old scar inferior wall myoview, 10/2009 EF 52%  . Chronic LBP 10/08/2011  . Impaired glucose tolerance 10/08/2011  . Erectile dysfunction 10/08/2011  . Testicular cancer   . Bell's palsy   . TIA (transient ischemic attack)     age 22  . CVA (cerebral infarction)     51yo  . OSA (obstructive sleep apnea)     CPAP  . History of colon polyps   . Anemia    Past Surgical History  Procedure Date  . Laser ablation 03/06/2010    left leg  . Cardiac valve replacement     aortic valve replaced mechanical age 69 after trauma, SVG to RCA at that time  . Cholecystectomy 2011  . Tonsillectomy 1967  . Otoplasaty      bilateral  . Surgery scrotal / testicular     cancer   Family History  Problem Relation Age of Onset  . Heart disease Mother   . Throat cancer Mother   . Diabetes Father   . Stroke Other   . Hypertension Other   . Cancer      several aunts ans uncles  . Other Maternal Uncle     brain aneurysm   History   Social History  . Marital Status: Married    Spouse Name: N/A    Number of Children: 0  . Years of Education: N/A   Occupational History  . Disabled Optician    Social History Main Topics  . Smoking status: Current Some Day Smoker    Types: Cigars  . Smokeless tobacco: Never Used   Comment: about 3 yearly  . Alcohol Use: No     social  . Drug Use: No  . Sexually Active: Not on file   Other Topics Concern  . Not on file   Social History Narrative  . No narrative on file   ROS-see HPI most problems are chronic Constitutional:   Few pounds  weight loss, no- night sweats, fevers, chills, fatigue, lassitude. HEENT:   No-  headaches, difficulty swallowing, tooth/dental problems, sore throat,       No-  sneezing, itching, ear ache, nasal congestion, post nasal drip,  CV:  No-   chest pain, orthopnea, PND, swelling in lower extremities, anasarca,  dizziness, +palpitations Resp: + shortness of breath with exertion or at rest.              No-   productive cough,  No non-productive cough,  No- coughing up of blood.              No-   change in color of mucus.  No- wheezing.   Skin: No-   rash or lesions. GI:  No-   heartburn, indigestion, abdominal pain, nausea, vomiting, diarrhea,                 change in bowel habits, loss of appetite GU: No-   dysuria, change in color of urine, no urgency or frequency.  No- flank pain. MS:  No-   joint pain or swelling.  No- decreased range of motion.  No- back pain. Legs feel hot and swollen  Neuro-     nothing unusual Psych:  No- change in mood or affect. No depression or anxiety.  No memory loss.  OBJ- Physical Exam General-  Alert, Oriented, Affect-appropriate, Distress- none acute, obese Skin- dark  purple stasis changes lower extremities Lymphadenopathy- none Head- atraumatic            Eyes- Gross vision intact, PERRLA, conjunctivae and secretions clear            Ears- Hearing, canals-normal            Nose- Clear, no-Septal dev, mucus, polyps, erosion, perforation             Throat- Mallampati II , mucosa clear , drainage- none, tonsils- atrophic Neck- flexible , trachea midline, no stridor , thyroid nl, carotid no bruit Chest - symmetrical excursion , unlabored           Heart/CV-+ IRR , 2/6 systolic precordial murmur , no gallop  , no rub, +crisp valve click                           - JVD- none , edema- +, + severe chronic stasis changes, varices- none           Lung- clear to P&A, wheeze- none, cough- none , dullness-none, rub- none           Chest wall- + sternotomy scar Abd- tender-no, distended-no, bowel sounds-present, HSM- no Br/ Gen/ Rectal- Not done, not indicated Extrem- cyanosis- none, clubbing, none, atrophy- none, strength- nl Neuro- grossly intact to observation

## 2012-03-07 NOTE — Assessment & Plan Note (Signed)
I suggested he go to a vein clinic to see if he could be qualified compression hose of some type, since he is not a surgical candidate according to the vascular surgeons.

## 2012-03-07 NOTE — Assessment & Plan Note (Signed)
Plan-new diagnostic split-night sleep study. Now that he has coverage, will be easier to help him reestablish qualifying diagnoses and appropriate CPAP pressure. He understands the basics of sleep apnea.

## 2012-03-09 ENCOUNTER — Encounter (INDEPENDENT_AMBULATORY_CARE_PROVIDER_SITE_OTHER): Payer: Medicare HMO | Admitting: Surgery

## 2012-03-09 ENCOUNTER — Encounter (INDEPENDENT_AMBULATORY_CARE_PROVIDER_SITE_OTHER): Payer: Self-pay | Admitting: Surgery

## 2012-03-09 ENCOUNTER — Ambulatory Visit (INDEPENDENT_AMBULATORY_CARE_PROVIDER_SITE_OTHER): Payer: Medicare HMO | Admitting: Surgery

## 2012-03-09 VITALS — BP 140/72 | HR 80 | Resp 16 | Ht 61.0 in | Wt 243.0 lb

## 2012-03-09 DIAGNOSIS — K649 Unspecified hemorrhoids: Secondary | ICD-10-CM

## 2012-03-09 MED ORDER — HYDROCORTISONE ACE-PRAMOXINE 2.5-1 % RE CREA: TOPICAL_CREAM | Freq: Three times a day (TID) | RECTAL | Status: AC

## 2012-03-09 NOTE — Patient Instructions (Signed)
Start using the hemorrhoid creme and see if this reduces some of the anal symptoms you are having

## 2012-03-09 NOTE — Progress Notes (Signed)
Lance Cook DOB: [birth year] MRN: [id number]                                                                                      DATE: 03/09/2012  PCP: Oliver Barre, MD Referring Provider: Corwin Levins, MD  IMPRESSION:  Hemorrhoids with associated skin tag  PLAN:   On going to try him again on some cortisone-based rectal cream to see if that helps with any of his perianal irritation. I did not see a polypoid lesion that needed to be excised on anoscopy. I think a lot of his problems with the anorectal area are related to his general problems with his bowel movements, loose stools and other issues which have yet to be completely resolved.He has multiple other medical issues which make him at risk to have much of anything done. He would need to be transitioned to Lovenox I think To remove even a small superficial skin tag or polyp.                 CC:  Chief Complaint  Patient presents with  . Colon Polyps    HPI:  Lance Cook is a 51 y.o.  male who presents for evaluation of Polyp at the dentate line noted on recent colonoscopy. The patient has a lot of GI symptoms with loose stools and having spent a lot of time on the toilet. He recently had a colonoscopy in 3 small polyps were removed from the rectal area and what appeared to be either another polyp or skin tag was noted on retroflex exam at the dentate line. The patient does complain a lot of burning pain and irritation of the perianal and perirectal area.  PMH:  has a past medical history of Diabetes mellitus; Hyperlipidemia; Hypertension; Arthritis; Leg pain (06/28/2010); Hiatal hernia; Atrial fibrillation; Reflux (11/06/09); Myocardial infarction (age 59); Varicose veins; Obesity; Peripheral vascular disease; Gout; CAD (coronary artery disease); Chronic LBP (10/08/2011); Impaired glucose tolerance (10/08/2011); Erectile dysfunction (10/08/2011); Testicular cancer; Bell's palsy; TIA (transient ischemic attack); CVA (cerebral infarction); OSA  (obstructive sleep apnea); History of colon polyps; and Anemia.  PSH:   has past surgical history that includes Laser ablation (03/06/2010); Cardiac valve replacement; Cholecystectomy (2011); Tonsillectomy (1967); Otoplasty; and Surgery scrotal / testicular.  ALLERGIES:   Allergies  Allergen Reactions  . Penicillins     REACTION: Hives    MEDICATIONS: Current outpatient prescriptions:allopurinol (ZYLOPRIM) 300 MG tablet, Take 1 tablet (300 mg total) by mouth daily., Disp: 90 tablet, Rfl: 3;  Ascorbic Acid (VITAMIN C) 1000 MG tablet, Take 1,000 mg by mouth daily.  , Disp: , Rfl: ;  atenolol (TENORMIN) 100 MG tablet, Take 1 tablet (100 mg total) by mouth daily. Take 100 mg tablet in morning and 1/2 tablet (50mg ) in the evening., Disp: 135 tablet, Rfl: 3 b complex vitamins tablet, Take 1 tablet by mouth daily.  , Disp: , Rfl: ;  Calcium-Magnesium-Zinc (CAL-MAG-ZINC PO), Take by mouth daily.  , Disp: , Rfl: ;  COUMADIN 5 MG tablet, 1 and 1/2 tab by mouth per day, Disp: 150 tablet, Rfl: 3;  digoxin (LANOXIN) 0.25 MG tablet, Take 1 tablet (250 mcg  total) by mouth daily., Disp: 90 tablet, Rfl: 3 diltiazem (CARTIA XT) 120 MG 24 hr capsule, Take 1 capsule (120 mg total) by mouth daily., Disp: 90 capsule, Rfl: 3;  esomeprazole (NEXIUM) 40 MG capsule, Take 1 capsule (40 mg total) by mouth daily., Disp: 90 capsule, Rfl: 3;  Fe Fum-FePoly-Vit C-Vit B3 (INTEGRA) 62.5-62.5-40-3 MG CAPS, Take 1 capsule by mouth daily., Disp: 30 capsule, Rfl: 2 furosemide (LASIX) 40 MG tablet, Take 1 tablet (40 mg total) by mouth daily as needed., Disp: 90 tablet, Rfl: 3;  methocarbamol (ROBAXIN) 500 MG tablet, Take 1 tablet (500 mg total) by mouth 3 (three) times daily., Disp: 270 tablet, Rfl: 3;  oxyCODONE-acetaminophen (PERCOCET) 5-325 MG per tablet, Take 1 tablet by mouth every 8 (eight) hours as needed for pain. To fill Dec 07, 2011, Disp: 90 tablet, Rfl: 0 potassium chloride (KLOR-CON 10) 10 MEQ tablet, Take 1 tablet (10 mEq total)  by mouth daily., Disp: 90 tablet, Rfl: 3;  vitamin E (VITAMIN E) 400 UNIT capsule, Take 400 Units by mouth daily.  , Disp: , Rfl: ;  hydrocortisone-pramoxine (ANALPRAM-HC) 2.5-1 % rectal cream, Place rectally 3 (three) times daily., Disp: 30 g, Rfl: 2  ROS: See the recent office visits In the electronic medical record EXAM:   Vs: BP 140/72  Pulse 80  Resp 16  Ht 5\' 1"  (1.549 m)  Wt 243 lb (110.224 kg)  BMI 45.91 kg/m2 Gen.: The patient is alert, oriented, no distress. Rectal: Perianal area shows some skin irritation and a few superficial posterior skin tags. Digital rectal exam is unremarkable and there is no tenderness. Anoscopy: The anoscope was gently introduced and circumferential exam accomplished. He has hemorrhoids particularly right posterior and anterior. There is no obvious polypoid lesion. The mucosa was quite friable. There is no evidence of tumor.Marland Kitchen  DATA REVIEWED:  I have reviewed the office notes and colonoscopy and endoscopy reports as well as multiple office notes from other physicians.    Ugochukwu Chichester J 03/09/2012  CC: Corwin Levins, MD,

## 2012-03-12 ENCOUNTER — Telehealth: Payer: Self-pay | Admitting: Internal Medicine

## 2012-03-12 ENCOUNTER — Telehealth: Payer: Self-pay | Admitting: Emergency Medicine

## 2012-03-12 DIAGNOSIS — G4733 Obstructive sleep apnea (adult) (pediatric): Secondary | ICD-10-CM

## 2012-03-12 NOTE — Telephone Encounter (Signed)
Pt called to tell me that his CPAP device is not working. He has a sleep study on Sunday at [facility]. Wants to know if he can get a loaner - not clear to me that we can do this as we won't know what pressures for him to use. He also has questions about his A Fib, palpitations. He wants to know if he can increase his tenormin or digoxin. I have recommended that he not change his meds right now. He will have to go without his CPAP for a few nights.

## 2012-03-12 NOTE — Telephone Encounter (Signed)
lmomtcb for the pt.  

## 2012-03-14 ENCOUNTER — Ambulatory Visit (HOSPITAL_BASED_OUTPATIENT_CLINIC_OR_DEPARTMENT_OTHER): Payer: Medicare HMO | Attending: Internal Medicine | Admitting: General Practice

## 2012-03-14 VITALS — Ht 61.0 in | Wt 243.0 lb

## 2012-03-14 DIAGNOSIS — E669 Obesity, unspecified: Secondary | ICD-10-CM | POA: Insufficient documentation

## 2012-03-14 DIAGNOSIS — I4891 Unspecified atrial fibrillation: Secondary | ICD-10-CM | POA: Insufficient documentation

## 2012-03-14 DIAGNOSIS — G4733 Obstructive sleep apnea (adult) (pediatric): Secondary | ICD-10-CM | POA: Insufficient documentation

## 2012-03-15 NOTE — Telephone Encounter (Signed)
I have sent order to [facility]. Please advise [facility] thanks

## 2012-03-15 NOTE — Telephone Encounter (Signed)
Pt states he had his sleep study done last night and would like for his study to be read ASAP. He understands that CDY is out of the office this week and wants to know if another sleep physician could read the study and order his machine. His current CPAP is not working at all. Dr. Craige Cotta, pls advise.

## 2012-03-15 NOTE — Telephone Encounter (Signed)
LMOMTCB x1 for pt 

## 2012-03-15 NOTE — Telephone Encounter (Signed)
Pt just had sleep study last night. CDY is out of town for a week. I have asked if Dr. Vassie Loll would read the study in CDY's absence since the patient does not have a working cpap machine at home and needs a new one. Dr. Vassie Loll stated that he would try to read it on Tues. 03/16/12. Called the patient and explained the above. Advised patient that he would have to go to a new DME since his insurance changed to [facility]. The in network DME is [facility]. Advised patient that if we faxed the order over without the sleep study that DME would have to have him sign a waiver, since they wouldn't have everything they needed to get this approved through his insurance. Pt stated that he would wait until the study has been read. Staff message sent to Dr. Vassie Loll to remind him to read. Rhonda J Cobb

## 2012-03-15 NOTE — Telephone Encounter (Signed)
Can we set him up for auto CPAP range 5 to 15 cm H2O through his DME pending his sleep study results.

## 2012-03-15 NOTE — Telephone Encounter (Signed)
[phone] machine is broken had sleep study last night very eager to back on machine wants to get a loaner if possible

## 2012-03-16 ENCOUNTER — Ambulatory Visit: Payer: Medicare HMO | Admitting: Vascular Surgery

## 2012-03-16 DIAGNOSIS — E669 Obesity, unspecified: Secondary | ICD-10-CM

## 2012-03-16 DIAGNOSIS — G4733 Obstructive sleep apnea (adult) (pediatric): Secondary | ICD-10-CM

## 2012-03-16 DIAGNOSIS — I4891 Unspecified atrial fibrillation: Secondary | ICD-10-CM

## 2012-03-17 NOTE — Telephone Encounter (Signed)
Dr. Vassie Loll read study in Dr. Roxy Cedar absence. Faxed order and study to [facility] to arrange new cpap set up. Called and spoke with Lance Cook and he is aware that order has been faxed. Pt advised that [name] will get in contact with him to arrange cpap set up. Pt thanked me for following through with this message and that he would wait on [facility] to contact him. Pt given [facility]'s phone number if he has any questions regarding set up. Rhonda J Cobb

## 2012-03-17 NOTE — Procedures (Signed)
NAME:  Lance, Cook NO.:  [id number]  MEDICAL RECORD NO.:  [id number]          PATIENT TYPE:  OUT  LOCATION:  SLEEP CENTER                 FACILITY[facility]  [facility]  PHYSICIAN:  Oretha Milch, MD      DATE OF BIRTH:  24-Nov-1960  DATE OF STUDY:  03/14/2012                           NOCTURNAL POLYSOMNOGRAM  REFERRING PHYSICIAN:  Clinton D. Young, MD, FCCP, FACP  INDICATION:  Lance Cook is a 51 year old obese gentleman with atrial fibrillation and known obstructive sleep apnea maintained on CPAP of 11 cm.  This nocturnal polysomnogram was performed for recertification purposes.  At the time of this study, he weighed 243 pounds with a height of 5 feet 1 inch, BMI of 46, neck size of 16.5 inches.  EPWORTH SLEEPINESS SCORE:  21.  MEDICATIONS:  Atenolol, Coumadin, Integra, allopurinol, Lasix, potassium chloride, digoxin, and Cartia.  This intervention polysomnogram was performed with a sleep technologist in attendance.  EEG, EOG, EMG, EKG, and respiratory parameters were recorded.  Sleep stages, arousals, limb movements, and respiratory data were scored according to criteria laid out by the American Academy of Sleep Medicine.  SLEEP ARCHITECTURE:  Lights out was at 10:23 p.m., lights on was at 5:09 a.m.  CPAP was initiated at 12:58 a.m.  During the diagnostic portion, total sleep time was 129 minutes with a sleep period time of 153 minutes and sleep efficiency of 83%.  Sleep latency was 1.5 minutes and REM sleep was not noted.  Sleep stages as the percentage of total sleep time was N1 21%, N2 79%.  Slow-wave sleep was not noted.  Wake after sleep onset was 24 minutes.  Supine sleep accounted for 129 minutes.  During the titration portion, sleep efficiency was 88% with REM sleep for 67 minutes (30%), and supine REM sleep for 67 minutes.  Longest period of REM sleep was seen around 1:00 a.m.  RESPIRATORY DATA:  During the diagnostic portion, there were total of  1 obstructive apnea, 0 central apneas, 0 mixed apneas, 114 hypopneas with an apnea/hypopnea index of 33 events per hour, and an RDI of 60 events per hour and lowest desaturation of 83%.  Due to this degree of respiratory disturbance, CPAP was initiated at 7 cm and titrated due to respiratory events and snoring to a final level of 15 cm.  At a level of 13 cm for 141 minutes of non-REM sleep, no events were noted.  CPAP was titrated to a level of 15 cm for snoring at this level for 50 minutes including 4 minutes of REM sleep.  No events or desaturations were noted.  This appears to be the optimal level used during the study.  LIMB MOVEMENT DATA:  No limb movements were noted.  During the diagnostic portion, the limb movement index during the titration portion was 8.7 events per hour.  However, these were not associated with arousals.  AROUSAL DATA:  The arousal index during the baseline portion was 44 events per hour with most of these related to hypopneas.  During the titration portion, there were spontaneous arousals with an index of 13 events per hour.  OXYGEN DATA:  The lowest  desaturation was 83% and the desaturation index was 56 events per hour during the diagnostic portion.  It was 81% during the titration portion with an index of 17 events per hour.  He spent 7 minutes with a saturation less than 88% during the titration portion.  CARDIAC DATA:  Rhythm was atrial fibrillation.  The low heart rate was 31 beats per minute.  The high heart rate recorded was an artifact.  No arrhythmias were noted.  DISCUSSION:  He was desensitized with a standard nasal pillows.  REM sleep was noted on the final CPAP level C-Flex of +3 was added for patient comfort as was heated humidity.  IMPRESSIONS-RECOMMENDATIONS: 1. Severe obstructive sleep apnea with predominant hypopneas causing     sleep fragmentation and oxygen desaturation. 2. This was corrected by CPAP of 13-15 cm with nasal pillows  standard     size and heated humidity with a C-Flex setting of 3 cm of +3. 3. No evidence of cardiac arrhythmias, limb movements, or behavioral     disturbance during sleep.  Rhythm was atrial fibrillation.  RECOMMENDATION: 1. CPAP can be initiated at 13-15 cm with nasal pillows standard size     and heated humidity with a C-Flex setting of +3 for patient     comfort. 2. Compliance should be monitored at this level. 3. He should be asked to avoid medications with sedative side effects     and he should be cautioned against driving when sleepy.     Oretha Milch, MD    RVA/MEDQ  D:  03/16/2012 13:13:14  T:  03/17/2012 03:19:26  Job:  [id number]  cc:   Joni Fears D. Maple Hudson, MD, FCCP, FACP  [facility] [address] [place] [address]

## 2012-03-19 ENCOUNTER — Other Ambulatory Visit: Payer: Self-pay

## 2012-03-19 ENCOUNTER — Telehealth: Payer: Self-pay | Admitting: Internal Medicine

## 2012-03-19 DIAGNOSIS — D509 Iron deficiency anemia, unspecified: Secondary | ICD-10-CM

## 2012-03-19 MED ORDER — OXYCODONE-ACETAMINOPHEN 5-325 MG PO TABS: 1.0000 | ORAL_TABLET | Freq: Three times a day (TID) | ORAL | Status: DC | PRN

## 2012-03-19 NOTE — Telephone Encounter (Signed)
No nothing else needed from me. I have faxed order over to [facility] to contact patient to arrange CPAP set up. Rhonda J Cobb

## 2012-03-19 NOTE — Telephone Encounter (Signed)
Can only do one this time as he is due to return at 6 months (august 2013)

## 2012-03-19 NOTE — Telephone Encounter (Signed)
Message copied by Corwin Levins on Fri Mar 19, 2012  2:32 PM ------      Message from: Scharlene Gloss B      Created: Fri Mar 19, 2012  1:20 PM       The patient is coming in on Monday to do labs and would like to have his hemoglobin checked.

## 2012-03-19 NOTE — Telephone Encounter (Signed)
Patient informed prescription requested is ready for pickup at the front desk.  Informed of MD's instruction on appt.  Patient did agreed to schedule appt.

## 2012-03-19 NOTE — Telephone Encounter (Signed)
Bjorn Loser, do you need this msg to follow up on anything further?  If not, we can send msg to Dr. Maple Hudson so he is aware.  Thank you.

## 2012-03-19 NOTE — Telephone Encounter (Signed)
Pt called requesting 3 mth refills of pain meds.

## 2012-03-22 ENCOUNTER — Telehealth: Payer: Self-pay | Admitting: Internal Medicine

## 2012-03-22 ENCOUNTER — Other Ambulatory Visit (INDEPENDENT_AMBULATORY_CARE_PROVIDER_SITE_OTHER): Payer: Medicare HMO

## 2012-03-22 ENCOUNTER — Other Ambulatory Visit: Payer: Self-pay | Admitting: Internal Medicine

## 2012-03-22 DIAGNOSIS — E611 Iron deficiency: Secondary | ICD-10-CM

## 2012-03-22 DIAGNOSIS — Z7901 Long term (current) use of anticoagulants: Secondary | ICD-10-CM

## 2012-03-22 DIAGNOSIS — N529 Male erectile dysfunction, unspecified: Secondary | ICD-10-CM

## 2012-03-22 DIAGNOSIS — D509 Iron deficiency anemia, unspecified: Secondary | ICD-10-CM

## 2012-03-22 DIAGNOSIS — E119 Type 2 diabetes mellitus without complications: Secondary | ICD-10-CM

## 2012-03-22 LAB — BASIC METABOLIC PANEL
CO2: 29 mEq/L (ref 19–32)
Calcium: 9.3 mg/dL (ref 8.4–10.5)
Chloride: 101 mEq/L (ref 96–112)
Creatinine, Ser: 0.9 mg/dL (ref 0.4–1.5)
Glucose, Bld: 118 mg/dL — ABNORMAL HIGH (ref 70–99)
Sodium: 139 mEq/L (ref 135–145)

## 2012-03-22 LAB — CBC WITH DIFFERENTIAL/PLATELET
Basophils Absolute: 0 10*3/uL (ref 0.0–0.1)
Basophils Relative: 0.6 % (ref 0.0–3.0)
Eosinophils Absolute: 0.2 10*3/uL (ref 0.0–0.7)
Hemoglobin: 14.5 g/dL (ref 13.0–17.0)
MCHC: 32.8 g/dL (ref 30.0–36.0)
MCV: 84.1 fl (ref 78.0–100.0)
Monocytes Absolute: 0.6 10*3/uL (ref 0.1–1.0)
Neutro Abs: 5.9 10*3/uL (ref 1.4–7.7)
RBC: 5.28 Mil/uL (ref 4.22–5.81)
RDW: 18.2 % — ABNORMAL HIGH (ref 11.5–14.6)

## 2012-03-22 LAB — HEMOGLOBIN A1C: Hgb A1c MFr Bld: 6.2 % (ref 4.6–6.5)

## 2012-03-22 LAB — LIPID PANEL
HDL: 30 mg/dL — ABNORMAL LOW (ref >39.00)
Total CHOL/HDL Ratio: 4
Triglycerides: 148 mg/dL (ref 0.0–149.0)

## 2012-03-22 LAB — PROTIME-INR: INR: 7.3 ratio (ref 0.8–1.0)

## 2012-03-22 NOTE — Telephone Encounter (Signed)
Message copied by Corwin Levins on Mon Mar 22, 2012  5:58 PM ------      Message from: Scharlene Gloss B      Created: Mon Mar 22, 2012  4:54 PM       Called the patient informed of results and all MD instructions.  The patient has been taking coumadin 7.5 mg every day once daily in the evenings.  He missed his Saturday PM dose as was out late.  He took his Saturday dose (7.5 mg) around 6 am Sunday morning.  He then took another dose (7.5 mg) on Sunday evening around 10 pm. He has had some bruising on his shoulders, he stated nothing significant.  He has been undergoing varicose vein treatment.  When wrapped well they do not bleed, but when he removes the bandages they do trickle a little.

## 2012-03-22 NOTE — Telephone Encounter (Signed)
Robin to call pt;  Has been taking coumadin 7.5 mg per day  This is now too much for his "system" as his INR is very high  OK to hold the coumadin for 3 days total, then re-start at 5 mg per day except 7.5 on mon-thur only  Please repeat INR in 1 wk - robin to add lab  Robin to re-inforce to pt that although he has been taking coumadin for many yrs, he should not take this lightly, as the risk increases with age;  We have to insist on rigorous monitoring of the INR in order to avoid over-anticoagulation and bleeding, and ask him to remain strictly compliant with the recommended dose

## 2012-03-22 NOTE — Telephone Encounter (Signed)
Tanya at [facility] called with critical lab results - PT 81.8 INR 7.3

## 2012-03-22 NOTE — Telephone Encounter (Signed)
Ok for monthly PT/INR unless o/w directed  Order done; to be done asap, as last was apparently mid may

## 2012-03-22 NOTE — Telephone Encounter (Signed)
Pt has appt on 8/7, does Dr Jonny Ruiz want to have standing order for PT/INR? Pt request to have check before next appt

## 2012-03-23 ENCOUNTER — Encounter: Payer: Self-pay | Admitting: Internal Medicine

## 2012-03-23 LAB — TESTOSTERONE, FREE, TOTAL, SHBG
Testosterone, Free: 49.5 pg/mL (ref 47.0–244.0)
Testosterone-% Free: 2.4 % (ref 1.6–2.9)

## 2012-03-23 NOTE — Telephone Encounter (Signed)
Called the patient informed of all MD Instructions on coumadin.  Informed the patient to write instructions down as well as not to forget.  Patient informed understanding of all instructions. Put order in for INR on March 29, 2012.

## 2012-04-07 ENCOUNTER — Ambulatory Visit: Payer: Medicare HMO | Admitting: Internal Medicine

## 2012-04-08 ENCOUNTER — Ambulatory Visit (INDEPENDENT_AMBULATORY_CARE_PROVIDER_SITE_OTHER): Payer: Medicare HMO | Admitting: Internal Medicine

## 2012-04-08 ENCOUNTER — Other Ambulatory Visit (INDEPENDENT_AMBULATORY_CARE_PROVIDER_SITE_OTHER): Payer: Medicare HMO

## 2012-04-08 ENCOUNTER — Telehealth: Payer: Self-pay | Admitting: Internal Medicine

## 2012-04-08 ENCOUNTER — Encounter: Payer: Self-pay | Admitting: Internal Medicine

## 2012-04-08 VITALS — BP 140/82 | HR 77 | Temp 97.9°F | Ht 61.0 in | Wt 242.1 lb

## 2012-04-08 DIAGNOSIS — E119 Type 2 diabetes mellitus without complications: Secondary | ICD-10-CM

## 2012-04-08 DIAGNOSIS — Z7901 Long term (current) use of anticoagulants: Secondary | ICD-10-CM

## 2012-04-08 DIAGNOSIS — E291 Testicular hypofunction: Secondary | ICD-10-CM | POA: Insufficient documentation

## 2012-04-08 DIAGNOSIS — E785 Hyperlipidemia, unspecified: Secondary | ICD-10-CM

## 2012-04-08 HISTORY — DX: Testicular hypofunction: E29.1

## 2012-04-08 MED ORDER — TESTOSTERONE CYPIONATE 200 MG/ML IM SOLN: 100.0000 mg | INTRAMUSCULAR | Status: DC

## 2012-04-08 MED ORDER — "INSULIN SYRINGE 29G X 1/2"" 0.5 ML MISC": 1.0000 "application " | Status: AC

## 2012-04-08 NOTE — Progress Notes (Signed)
Subjective:    Patient ID: Lance Cook, male    DOB: 29-Apr-1961, 51 y.o.   MRN: [id number]  HPI  Here to f/u; overall doing ok, does have significant transportation problems and tight finances, asks for standing order for the INR so that he can have done every 2-4 wks as he is able to afford transport here;  No overt bleeding or bruising.  Pt denies chest pain, increased sob or doe, wheezing, orthopnea, PND, increased LE swelling, palpitations, dizziness or syncope.   Pt denies polydipsia, polyuria, or low sugar symptoms such as weakness or confusion improved with po intake.  Pt states overall good compliance with meds, trying to follow lower cholesterol, diabetic diet, wt overall stable but little exercise however.     Pt denies new neurological symptoms such as new headache, or facial or extremity weakness or numbness  Denies worsening depressive symptoms, suicidal ideation, or panic.   Pt denies fever, wt loss, night sweats, loss of appetite, or other constitutional symptoms Past Medical History  Diagnosis Date  . Diabetes mellitus   . Hyperlipidemia   . Hypertension   . Arthritis   . Leg pain 06/28/2010  . Hiatal hernia   . Atrial fibrillation   . Reflux 11/06/09  . Myocardial infarction age 54  . Varicose veins   . Obesity   . Peripheral vascular disease   . Gout   . CAD (coronary artery disease)     Old scar inferior wall myoview, 10/2009 EF 52%  . Chronic LBP 10/08/2011  . Impaired glucose tolerance 10/08/2011  . Erectile dysfunction 10/08/2011  . Testicular cancer   . Bell's palsy   . TIA (transient ischemic attack)     age 79  . CVA (cerebral infarction)     51yo  . OSA (obstructive sleep apnea)     CPAP  . History of colon polyps   . Anemia    Past Surgical History  Procedure Date  . Laser ablation 03/06/2010    left leg  . Cardiac valve replacement     aortic valve replaced mechanical age 47 after trauma, SVG to RCA at that time  . Cholecystectomy 2011  . Tonsillectomy 1967    . Otoplasaty     bilateral  . Surgery scrotal / testicular     cancer    reports that he has been smoking Cigars.  He has never used smokeless tobacco. He reports that he drinks alcohol. He reports that he does not use illicit drugs. family history includes Cancer in his maternal aunt and maternal uncle; Diabetes in his father; Heart disease in his mother; Hypertension in his other; Stroke in his other; and Throat cancer in his mother. Allergies  Allergen Reactions  . Penicillins     REACTION: Hives   Current Outpatient Prescriptions on File Prior to Visit  Medication Sig Dispense Refill  . allopurinol (ZYLOPRIM) 300 MG tablet Take 1 tablet (300 mg total) by mouth daily.  90 tablet  3  . Ascorbic Acid (VITAMIN C) 1000 MG tablet Take 1,000 mg by mouth daily.        Marland Kitchen atenolol (TENORMIN) 100 MG tablet Take 1 tablet (100 mg total) by mouth daily. Take 100 mg tablet in morning and 1/2 tablet (50mg ) in the evening.  135 tablet  3  . b complex vitamins tablet Take 1 tablet by mouth daily.        . Calcium-Magnesium-Zinc (CAL-MAG-ZINC PO) Take by mouth daily.        Marland Kitchen  COUMADIN 5 MG tablet 1 and 1/2 tab by mouth per day  150 tablet  3  . digoxin (LANOXIN) 0.25 MG tablet Take 1 tablet (250 mcg total) by mouth daily.  90 tablet  3  . diltiazem (CARTIA XT) 120 MG 24 hr capsule Take 1 capsule (120 mg total) by mouth daily.  90 capsule  3  . esomeprazole (NEXIUM) 40 MG capsule Take 1 capsule (40 mg total) by mouth daily.  90 capsule  3  . Fe Fum-FePoly-Vit C-Vit B3 (INTEGRA) 62.5-62.5-40-3 MG CAPS Take 1 capsule by mouth daily.  30 capsule  2  . furosemide (LASIX) 40 MG tablet Take 1 tablet (40 mg total) by mouth daily as needed.  90 tablet  3  . methocarbamol (ROBAXIN) 500 MG tablet Take 1 tablet (500 mg total) by mouth 3 (three) times daily.  270 tablet  3  . oxyCODONE-acetaminophen (PERCOCET/ROXICET) 5-325 MG per tablet Take 1 tablet by mouth every 8 (eight) hours as needed for pain.  90 tablet  0   . potassium chloride (KLOR-CON 10) 10 MEQ tablet Take 1 tablet (10 mEq total) by mouth daily.  90 tablet  3  . vitamin E (VITAMIN E) 400 UNIT capsule Take 400 Units by mouth daily.        Marland Kitchen testosterone cypionate (DEPOTESTOTERONE CYPIONATE) 200 MG/ML injection Inject 0.5 mLs (100 mg total) into the muscle once a week.  10 mL  5   Review of Systems Review of Systems  Constitutional: Negative for diaphoresis and unexpected weight change.  HENT: Negative for drooling and tinnitus.   Eyes: Negative for photophobia and visual disturbance.  Respiratory: Negative for choking and stridor.   Gastrointestinal: Negative for vomiting and blood in stool.  Genitourinary: Negative for hematuria and decreased urine volume.  Musculoskeletal: Negative for gait problem.  Skin: Negative for color change and wound. though has numerous insect bites to exposed skin of arms and legs, lives next to a water source area    Objective:   Physical Exam BP 140/82  Pulse 77  Temp 97.9 F (36.6 C) (Oral)  Ht 5\' 1"  (1.549 m)  Wt 242 lb 2 oz (109.827 kg)  BMI 45.75 kg/m2  SpO2 96% Physical Exam  VS noted Constitutional: Pt appears well-developed and well-nourished.  HENT: Head: Normocephalic.  Right Ear: External ear normal.  Left Ear: External ear normal.  Eyes: Conjunctivae and EOM are normal. Pupils are equal, round, and reactive to light.  Neck: Normal range of motion. Neck supple.  Cardiovascular: Normal rate and regular rhythm.   Pulmonary/Chest: Effort normal and breath sounds normal.  Neurological: Pt is alert. Not confused Skin: Skin is warm. No erythema. /no cellultiis related to insect bites Psychiatric: Pt behavior is normal. Thought content normal. 1+ nervous    Assessment & Plan:

## 2012-04-08 NOTE — Telephone Encounter (Signed)
Message copied by Corwin Levins on Thu Apr 08, 2012  5:49 PM ------      Message from: Scharlene Gloss B      Created: Thu Apr 08, 2012  4:53 PM       Called the patient informed of MD's instructions.   Please advise on how to take as the patient has not been taking the coumadin as instructed and need specifically how to take.  He did agree to do whatever MD has instructed.

## 2012-04-08 NOTE — Telephone Encounter (Signed)
Ok to take 5 mg per day routinely (no matter the diet) and f/u INR at 10 days

## 2012-04-08 NOTE — Assessment & Plan Note (Signed)
stable overall by hx and exam, most recent data reviewed with pt, and pt to continue medical treatment as before Lab Results  Component Value Date   LDLCALC 63 03/22/2012

## 2012-04-08 NOTE — Assessment & Plan Note (Signed)
With recent testost in the low 200's - for testost replacement tx - gave rx /syringes rx today, with plan for f/u tesosterone level next visit

## 2012-04-08 NOTE — Assessment & Plan Note (Addendum)
stable overall by hx and exam, most recent data reviewed with pt, and pt to continue medical treatment as before Lab Results  Component Value Date   HGBA1C 6.2 03/22/2012    

## 2012-04-08 NOTE — Patient Instructions (Addendum)
Take all new medications as prescribed Continue all other medications as before Please go to LAB in the Basement for the blood and/or urine tests to be done today You will be contacted by phone if any changes need to be made immediately.  Otherwise, you will receive a letter about your results with an explanation. Please return in 3 months, or sooner if needed

## 2012-04-09 NOTE — Telephone Encounter (Signed)
Called the patient informed of MD instructions on medication and to return in 10 days to recheck INR.  Patient stated he would do so.  Put order in for PT/INR to be done in 10 days.

## 2012-04-13 ENCOUNTER — Other Ambulatory Visit (INDEPENDENT_AMBULATORY_CARE_PROVIDER_SITE_OTHER): Payer: Medicare HMO

## 2012-04-13 DIAGNOSIS — Z7901 Long term (current) use of anticoagulants: Secondary | ICD-10-CM

## 2012-04-13 LAB — PROTIME-INR
INR: 1.9 ratio — ABNORMAL HIGH (ref 0.8–1.0)
Prothrombin Time: 20.5 s — ABNORMAL HIGH (ref 10.2–12.4)

## 2012-04-14 ENCOUNTER — Telehealth: Payer: Self-pay | Admitting: Internal Medicine

## 2012-04-14 NOTE — Telephone Encounter (Signed)
The pt called the triage in hopes of getting clarification on his pt/inr.  He left the phone number [phone]. Thanks!

## 2012-04-14 NOTE — Telephone Encounter (Signed)
Caller: Irfan/Patient; Patient Name: Lance Cook; PCP: Oliver Barre; Best Callback Phone Number: [phone].  Patient states he has been "playing phone tag" with Zella Ball regarding his PT/INR done 04/13/12.  Per Epic, INR 1.9 04/13/12.  Dr. Jonny Ruiz had asked staff to contact patient and verify exactly what dose he has been taking.  Patient states has been taking coumadin 5mg  daily as ordered, with no changes in his dose or diet.  While on call, patient was contacted by Zella Ball in office; states she will speak with Dr. Jonny Ruiz and call patient back directly.  krs/can

## 2012-04-14 NOTE — Telephone Encounter (Signed)
Called the patient back and informed of MD's question on how much coumadin he is taking.  He informed 5 mg everyday per MD instructions. He did want to know when to return for next PT/INR.

## 2012-04-14 NOTE — Telephone Encounter (Signed)
Called left message to call back 

## 2012-04-15 ENCOUNTER — Telehealth: Payer: Self-pay

## 2012-04-15 DIAGNOSIS — Z7901 Long term (current) use of anticoagulants: Secondary | ICD-10-CM

## 2012-04-15 NOTE — Telephone Encounter (Signed)
Put order in for PT/INR 

## 2012-04-16 ENCOUNTER — Ambulatory Visit (INDEPENDENT_AMBULATORY_CARE_PROVIDER_SITE_OTHER): Payer: Medicare HMO | Admitting: Internal Medicine

## 2012-04-16 ENCOUNTER — Encounter: Payer: Self-pay | Admitting: Internal Medicine

## 2012-04-16 VITALS — BP 140/80 | HR 87 | Ht 61.5 in | Wt 246.4 lb

## 2012-04-16 DIAGNOSIS — G4733 Obstructive sleep apnea (adult) (pediatric): Secondary | ICD-10-CM

## 2012-04-16 NOTE — Progress Notes (Signed)
03/05/12- 50 yoM smoker referred by Dr. Jonny Ruiz for sleep apnea. Here with his wife. He is a disabled optician with an extensive medical history. He had a diagnostic sleep study at his cardiology office/[facility] several years ago. He says his pressure is "11 auto" and that previously he was using BiPAP 18/13. His insurance ran out and he lost track of his DME company. He has been using CPAP all night every night but his machine now cuts on and off for several times each night. He thinks it is about 51 years old. Bedtime 8 to 10 PM, 30 minutes sleep latency, waking 3-5 times during the night before waking around 5 AM. His weight is down around 10 pounds. He has had no ENT surgery. Extensive cardiovascular history includes hypertension, atrial fibrillation/Coumadin, CVA, aortic valve repair/St. Jude valve 25 years ago, coronary bypass graft. He has severe chronic lymphedema of the lower extremities and says he has been told by vascular surgeon that they can't help. Other medical history includes diabetes, testicular cancer.  04/16/12- 50 yoM smoker followed for obstructive sleep apnea.  He is a disabled optician with an extensive medical history. NPSG 03/14/12- severe obstructive sleep apnea. AHI 53.5 per hour with successful CPAP titration to 15 CWP, AHI 0 per hour. We reviewed good sleep hygiene, the medical concerns of sleep apnea, driving responsibility, weight control and treatment options. We were able to get him started with a new CPAP machine/ [employer] autotitration 5-15 CWP. He is using a nasal pillows mask and humidifier. Resting much better without daytime confusion or snoring  ROS-see HPI most problems are chronic Constitutional:   Few pounds  weight loss, no- night sweats, fevers, chills, fatigue, lassitude. HEENT:   No-  headaches, difficulty swallowing, tooth/dental problems, sore throat,       No-  sneezing, itching, ear ache, nasal congestion, post nasal drip,  CV:  No-   chest pain, orthopnea, PND,  swelling in lower extremities, anasarca,  dizziness, +palpitations Resp: + shortness of breath with exertion or at rest.              No-   productive cough,  No non-productive cough,  No- coughing up of blood.              No-   change in color of mucus.  No- wheezing.   Skin: No-   rash or lesions. GI:  No-   heartburn, indigestion, abdominal pain, nausea, vomiting,  GU:  MS:  No-   joint pain or swelling.   Neuro-     nothing unusual Psych:  No- change in mood or affect. No depression or anxiety.  No memory loss.  OBJ- Physical Exam General- Alert, Oriented, Affect-appropriate, Distress- none acute, obese Skin- prominent veins on exposed skin- lower extremities Lymphadenopathy- none Head- atraumatic            Eyes- Gross vision intact, PERRLA, conjunctivae and secretions clear            Ears- Hearing, canals-normal            Nose- Clear, no-Septal dev, mucus, polyps, erosion, perforation             Throat- Mallampati II , mucosa clear , drainage- none, tonsils- atrophic Neck- flexible , trachea midline, no stridor , thyroid nl, carotid no bruit Chest - symmetrical excursion , unlabored           Heart/CV-+ IRR , 2/6 systolic precordial murmur , no gallop  , no  rub, +crisp valve click                           - JVD- none , edema- +, + severe chronic stasis changes, varices- none           Lung- clear to P&A, wheeze- none, cough- none , dullness-none, rub- none           Chest wall- + sternotomy scar Abd-  Br/ Gen/ Rectal- Not done, not indicated Extrem- cyanosis- none, clubbing, none, atrophy- none, strength- nl. +very heavy legs/wrapped with Ace bandages  Neuro- grossly intact to observation

## 2012-04-16 NOTE — Patient Instructions (Signed)
Order- DME [name]- set his CPAP autotitration 10-15 cwp

## 2012-04-24 NOTE — Assessment & Plan Note (Signed)
He is off to a good start using CPAP auto 5-15/ [employer]. We have educated him on the appropriate issues as outlined. He is now using autotitration 5-15 and would prefer to stay with that rather than changing to a fixed pressure.

## 2012-04-27 ENCOUNTER — Other Ambulatory Visit (INDEPENDENT_AMBULATORY_CARE_PROVIDER_SITE_OTHER): Payer: Medicare HMO

## 2012-04-27 ENCOUNTER — Telehealth: Payer: Self-pay | Admitting: Internal Medicine

## 2012-04-27 ENCOUNTER — Other Ambulatory Visit: Payer: Self-pay | Admitting: Internal Medicine

## 2012-04-27 DIAGNOSIS — Z7901 Long term (current) use of anticoagulants: Secondary | ICD-10-CM

## 2012-04-27 MED ORDER — OXYCODONE-ACETAMINOPHEN 5-325 MG PO TABS: 1.0000 | ORAL_TABLET | Freq: Three times a day (TID) | ORAL | Status: DC | PRN

## 2012-04-27 MED ORDER — TADALAFIL 20 MG PO TABS: 20.0000 mg | ORAL_TABLET | Freq: Every day | ORAL | Status: AC | PRN

## 2012-04-27 MED ORDER — TADALAFIL 5 MG PO TABS: 5.0000 mg | ORAL_TABLET | Freq: Every day | ORAL | Status: AC | PRN

## 2012-04-27 NOTE — Telephone Encounter (Signed)
Patient was given hardcopy of both prescriptions.

## 2012-04-27 NOTE — Telephone Encounter (Signed)
Needs refill of oxyCODONE-acetaminophen and cialis

## 2012-04-27 NOTE — Telephone Encounter (Signed)
Done hardcopy to [facility]  

## 2012-04-28 ENCOUNTER — Telehealth: Payer: Self-pay

## 2012-04-28 DIAGNOSIS — Z7901 Long term (current) use of anticoagulants: Secondary | ICD-10-CM

## 2012-04-28 NOTE — Telephone Encounter (Signed)
Put order in for PT/INR 

## 2012-05-31 ENCOUNTER — Telehealth: Payer: Self-pay | Admitting: Internal Medicine

## 2012-05-31 MED ORDER — OXYCODONE-ACETAMINOPHEN 5-325 MG PO TABS: 1.0000 | ORAL_TABLET | Freq: Three times a day (TID) | ORAL | Status: DC | PRN

## 2012-05-31 NOTE — Telephone Encounter (Signed)
Caller: [place]/Patient; Patient Name: Lance Cook; PCP: Oliver Barre (Adults only); Best Callback Phone Number: [phone].  Pt calling about a refill for Percocet 5/325 mg. Pt has an appt Nov 14 th and would like 2 scripts to get him through his next appt so he doesn't have to call back.  Pt has enough medication to last him through the day. No triage.   PLEASE FOLLOW UP WITH PT REGARDING SCRIPT FOR PERCOCET 5-325MG . Thank you.

## 2012-05-31 NOTE — Telephone Encounter (Signed)
Called the patient informed hardcopy of prescriptions requested is ready for pickup at the front desk.

## 2012-05-31 NOTE — Telephone Encounter (Signed)
Done hardcopy to [facility]  

## 2012-07-08 ENCOUNTER — Other Ambulatory Visit: Payer: Self-pay | Admitting: *Deleted

## 2012-07-08 DIAGNOSIS — D509 Iron deficiency anemia, unspecified: Secondary | ICD-10-CM

## 2012-07-08 DIAGNOSIS — Z1211 Encounter for screening for malignant neoplasm of colon: Secondary | ICD-10-CM

## 2012-07-08 MED ORDER — INTEGRA 62.5-62.5-40-3 MG PO CAPS: 1.0000 | ORAL_CAPSULE | Freq: Every day | ORAL | Status: DC

## 2012-07-08 NOTE — Telephone Encounter (Signed)
Pt called for refill of Integra sent to [facility].

## 2012-07-15 ENCOUNTER — Encounter: Payer: Self-pay | Admitting: Internal Medicine

## 2012-07-15 ENCOUNTER — Ambulatory Visit (INDEPENDENT_AMBULATORY_CARE_PROVIDER_SITE_OTHER): Payer: Medicare HMO | Admitting: General Practice

## 2012-07-15 ENCOUNTER — Ambulatory Visit (INDEPENDENT_AMBULATORY_CARE_PROVIDER_SITE_OTHER): Payer: Medicare HMO | Admitting: Internal Medicine

## 2012-07-15 VITALS — BP 104/64 | HR 62 | Temp 97.1°F | Ht 61.0 in | Wt 231.0 lb

## 2012-07-15 DIAGNOSIS — R220 Localized swelling, mass and lump, head: Secondary | ICD-10-CM | POA: Insufficient documentation

## 2012-07-15 DIAGNOSIS — Z954 Presence of other heart-valve replacement: Secondary | ICD-10-CM

## 2012-07-15 DIAGNOSIS — G8929 Other chronic pain: Secondary | ICD-10-CM

## 2012-07-15 DIAGNOSIS — E291 Testicular hypofunction: Secondary | ICD-10-CM

## 2012-07-15 DIAGNOSIS — I4891 Unspecified atrial fibrillation: Secondary | ICD-10-CM

## 2012-07-15 DIAGNOSIS — Z7901 Long term (current) use of anticoagulants: Secondary | ICD-10-CM | POA: Insufficient documentation

## 2012-07-15 DIAGNOSIS — E119 Type 2 diabetes mellitus without complications: Secondary | ICD-10-CM

## 2012-07-15 DIAGNOSIS — M545 Low back pain: Secondary | ICD-10-CM

## 2012-07-15 DIAGNOSIS — R221 Localized swelling, mass and lump, neck: Secondary | ICD-10-CM

## 2012-07-15 DIAGNOSIS — Z0000 Encounter for general adult medical examination without abnormal findings: Secondary | ICD-10-CM

## 2012-07-15 DIAGNOSIS — I10 Essential (primary) hypertension: Secondary | ICD-10-CM

## 2012-07-15 MED ORDER — OXYCODONE-ACETAMINOPHEN 5-325 MG PO TABS: 1.0000 | ORAL_TABLET | Freq: Three times a day (TID) | ORAL | Status: DC | PRN

## 2012-07-15 MED ORDER — AZITHROMYCIN 250 MG PO TABS: ORAL_TABLET | ORAL | Status: DC

## 2012-07-15 NOTE — Assessment & Plan Note (Signed)
stable overall by hx and exam, most recent data reviewed with pt, and pt to continue medical treatment as before Lab Results  Component Value Date   HGBA1C 6.2 03/22/2012

## 2012-07-15 NOTE — Assessment & Plan Note (Signed)
stable overall by hx and exam, most recent data reviewed with pt, and pt to continue medical treatment as before Lab Results  Component Value Date   WBC 8.0 03/22/2012   HGB 14.5 03/22/2012   HCT 44.4 03/22/2012   PLT 129.0 Repeated and verified X2.* 03/22/2012   GLUCOSE 118* 03/22/2012   CHOL 123 03/22/2012   TRIG 148.0 03/22/2012   HDL 30.00* 03/22/2012   LDLCALC 63 03/22/2012   ALT 19 01/07/2012   AST 19 01/07/2012   NA 139 03/22/2012   K 4.4 03/22/2012   CL 101 03/22/2012   CREATININE 0.9 03/22/2012   BUN 17 03/22/2012   CO2 29 03/22/2012   TSH 1.67 10/08/2011   PSA 0.06* 10/08/2011   INR 2.7 07/15/2012   HGBA1C 6.2 03/22/2012   MICROALBUR 19.5* 10/08/2011

## 2012-07-15 NOTE — Assessment & Plan Note (Addendum)
Urged compliance but finances tight, simply not able to afford the syringes though not expensive, to take as able, f/u next visit but also check level in 1 mo

## 2012-07-15 NOTE — Progress Notes (Signed)
Subjective:    Patient ID: Lance Cook, male    DOB: June 28, 1961, 51 y.o.   MRN: [id number]  HPI  Here to f/u; overall doing ok,  Pt denies chest pain, increased sob or doe, wheezing, orthopnea, PND, increased LE swelling, palpitations, dizziness or syncope.  Pt denies new neurological symptoms such as new headache, or facial or extremity weakness or numbness   Pt denies polydipsia, polyuria, or low sugar symptoms such as weakness or confusion improved with po intake.  Pt states overall good compliance with meds, trying to follow lower cholesterol, diabetic diet, wt overall stable but little exercise however  Has not been using the testosterone regularly, last dose 1 mo ago.  Wants to check coumadin with our clinic now.   Pt denies fever, wt loss, night sweats, loss of appetite, or other constitutional symptoms Past Medical History  Diagnosis Date  . Diabetes mellitus   . Hyperlipidemia   . Hypertension   . Arthritis   . Leg pain 06/28/2010  . Hiatal hernia   . Atrial fibrillation   . Reflux 11/06/09  . Myocardial infarction age 63  . Varicose veins   . Obesity   . Peripheral vascular disease   . Gout   . CAD (coronary artery disease)     Old scar inferior wall myoview, 10/2009 EF 52%  . Chronic LBP 10/08/2011  . Impaired glucose tolerance 10/08/2011  . Erectile dysfunction 10/08/2011  . Testicular cancer   . Bell's palsy   . TIA (transient ischemic attack)     age 54  . CVA (cerebral infarction)     51yo  . OSA (obstructive sleep apnea)     CPAP  . History of colon polyps   . Anemia   . Hypogonadism male 04/08/2012   Past Surgical History  Procedure Date  . Laser ablation 03/06/2010    left leg  . Cardiac valve replacement     aortic valve replaced mechanical age 49 after trauma, SVG to RCA at that time  . Cholecystectomy 2011  . Tonsillectomy 1967  . Otoplasaty     bilateral  . Surgery scrotal / testicular     cancer    reports that he has been smoking Cigars.  He has never used  smokeless tobacco. He reports that he drinks alcohol. He reports that he does not use illicit drugs. family history includes Cancer in his maternal aunt and maternal uncle; Diabetes in his father; Heart disease in his mother; Hypertension in his other; Stroke in his other; and Throat cancer in his mother. Allergies  Allergen Reactions  . Penicillins     REACTION: Hives   Current Outpatient Prescriptions on File Prior to Visit  Medication Sig Dispense Refill  . allopurinol (ZYLOPRIM) 300 MG tablet Take 1 tablet (300 mg total) by mouth daily.  90 tablet  3  . Ascorbic Acid (VITAMIN C) 1000 MG tablet Take 1,000 mg by mouth daily.        Marland Kitchen atenolol (TENORMIN) 100 MG tablet Take 1 tablet (100 mg total) by mouth daily. Take 100 mg tablet in morning and 1/2 tablet (50mg ) in the evening.  135 tablet  3  . b complex vitamins tablet Take 1 tablet by mouth daily.        . Calcium-Magnesium-Zinc (CAL-MAG-ZINC PO) Take by mouth daily.        Marland Kitchen COUMADIN 5 MG tablet 1 and 1/2 tab by mouth per day  150 tablet  3  . digoxin (LANOXIN) 0.25  MG tablet Take 1 tablet (250 mcg total) by mouth daily.  90 tablet  3  . diltiazem (CARTIA XT) 120 MG 24 hr capsule Take 1 capsule (120 mg total) by mouth daily.  90 capsule  3  . esomeprazole (NEXIUM) 40 MG capsule Take 1 capsule (40 mg total) by mouth daily.  90 capsule  3  . Fe Fum-FePoly-Vit C-Vit B3 (INTEGRA) 62.5-62.5-40-3 MG CAPS Take 1 capsule by mouth daily.  90 capsule  1  . furosemide (LASIX) 40 MG tablet Take 1 tablet (40 mg total) by mouth daily as needed.  90 tablet  3  . INSULIN SYRINGE .5CC/29G (B-D INS SYR ULTRAFINE .5CC/29G) 29G X 1/2" 0.5 ML MISC 1 application by Does not apply route once a week.  100 each  0  . IRON PO Take by mouth daily.      . methocarbamol (ROBAXIN) 500 MG tablet Take 1 tablet (500 mg total) by mouth 3 (three) times daily.  270 tablet  3  . potassium chloride (KLOR-CON 10) 10 MEQ tablet Take 1 tablet (10 mEq total) by mouth daily.  90  tablet  3  . vitamin E (VITAMIN E) 400 UNIT capsule Take 400 Units by mouth daily.        Marland Kitchen testosterone cypionate (DEPOTESTOTERONE CYPIONATE) 200 MG/ML injection Inject 0.5 mLs (100 mg total) into the muscle once a week.  10 mL  5   Review of Systems  Constitutional: Negative for diaphoresis and unexpected weight change.  HENT: Negative for tinnitus.   Eyes: Negative for photophobia and visual disturbance.  Respiratory: Negative for choking and stridor.   Gastrointestinal: Negative for vomiting and blood in stool.  Genitourinary: Negative for hematuria and decreased urine volume.  Musculoskeletal: Negative for gait problem.  Skin: Negative for color change and wound.  Neurological: Negative for tremors and numbness.  Psychiatric/Behavioral: Negative for decreased concentration. The patient is not hyperactive.       Objective:   Physical Exam BP 104/64  Pulse 62  Temp 97.1 F (36.2 C) (Oral)  Ht 5\' 1"  (1.549 m)  Wt 231 lb (104.781 kg)  BMI 43.65 kg/m2  SpO2 93% Physical Exam  VS noted, not ill appearing Constitutional: Pt appears well-developed and well-nourished.  HENT: Head: Normocephalic.  Right Ear: External ear normal.  Left Ear: External ear normal.  Eyes: Conjunctivae and EOM are normal. Pupils are equal, round, and reactive to light.  Neck: Normal range of motion. Neck supple. has rather larger than egg soft NT mass left nec about 1-2 cm below jawline - ?fatty vs cystic mass Cardiovascular: Normal rate and regular rhythm.   Pulmonary/Chest: Effort normal and breath sounds normal.  Abd:  Soft, NT, non-distended, + BS Neurological: Pt is alert. Not confused  Skin: Skin is warm. No erythema.  Psychiatric: Pt behavior is normal. Thought content normal.     Assessment & Plan:

## 2012-07-15 NOTE — Assessment & Plan Note (Signed)
?   Lipoma vs other/salivary gland, for zpack asd, but refer to ENT also per pt request

## 2012-07-15 NOTE — Patient Instructions (Addendum)
Take all new medications as prescribed - the antibiotic Your refill was done as requested Continue all other medications as before, including regular use of the testosterone shots Please go to LAB in the Basement for the blood and/or urine tests to be done today - to be done in 4 wks (No office visit needed) You will be contacted by phone if any changes need to be made immediately.  Otherwise, you will receive a letter about your results with an explanation, but please check mychart first Please have the pharmacy call with any other refills you may need. You will be contacted regarding the referral for: ENT Thank you for enrolling in MyChart. Please follow the instructions below to securely access your online medical record. MyChart allows you to send messages to your doctor, view your test results, renew your prescriptions, schedule appointments, and more. Remember your username is [email or web address] with password [email or web address] Please return in 6 mo with Lab testing done 3-5 days before

## 2012-07-15 NOTE — Assessment & Plan Note (Signed)
stable overall by hx and exam, most recent data reviewed with pt, and pt to continue medical treatment as before BP Readings from Last 3 Encounters:  07/15/12 104/64  04/16/12 140/80  04/08/12 140/82

## 2012-07-28 ENCOUNTER — Telehealth: Payer: Self-pay | Admitting: Medical Oncology

## 2012-07-28 ENCOUNTER — Telehealth: Payer: Self-pay | Admitting: *Deleted

## 2012-07-28 NOTE — Telephone Encounter (Deleted)
Opened in error

## 2012-07-28 NOTE — Telephone Encounter (Signed)
Called and informed pt that he will need to contact his PCP to discuss about what ENT he needs to see that will accept his insurance.  SLJ

## 2012-07-28 NOTE — Telephone Encounter (Signed)
Has swelling in sside of throat -please return his call DR Melvyn Novas referred him to ENT but ENT does not take [facility]. Does Dr Arbutus Ped have name of anther ENT. I will forward to Kathlee Nations to followup

## 2012-08-16 ENCOUNTER — Telehealth: Payer: Self-pay | Admitting: Internal Medicine

## 2012-08-16 NOTE — Telephone Encounter (Signed)
I understand from our [facility] that the issue is that [employer] (his insurance) does not cover ENT in [place] oncology would not likely be most appropriate first step for him as they would not perform a biopsy if needed  OK for robin to explain to pt, he should see ENT, and we can try to refer outside of [place] since we are not able to do this in [place]

## 2012-08-16 NOTE — Telephone Encounter (Signed)
Patient called wanting a referral to see  ent oncology. Pt had appt to see ent and would not keep because they required a up front fee which he says he can not pay

## 2012-08-16 NOTE — Telephone Encounter (Signed)
Patient called requesting ENT/oncology referral. The patient has a history of cancer as well as a family history of cancer.

## 2012-08-17 NOTE — Telephone Encounter (Signed)
Patient informed of MD instructions and agreed to schedule with Dr. Jonny Ruiz

## 2012-08-17 NOTE — Telephone Encounter (Signed)
Ok for appt with me, as we should refer to oncology until further information is obtained; he likely may need neck CT, and we could consider a FNA (fine needle aspirate biopsy) that could be arranged locally such as with [facility] or [facility]  In other words, he would need a more definite diagnosis to then refer to oncology for further eval and tx

## 2012-08-17 NOTE — Telephone Encounter (Signed)
Called the patient.  Explained thoroughly all MD instructions and explanation on situation. The patients wife informed he has a knot on the side of his throat the size of a golf ball. They are refusing to see anyone but an oncologist at this time. They cannot travel outside of [place] as he has no transportation.  Please advise as they want a call back today

## 2012-08-17 NOTE — Telephone Encounter (Signed)
Called the patient left message to call back 

## 2012-08-18 ENCOUNTER — Ambulatory Visit (INDEPENDENT_AMBULATORY_CARE_PROVIDER_SITE_OTHER): Payer: Medicare HMO | Admitting: Internal Medicine

## 2012-08-18 ENCOUNTER — Other Ambulatory Visit (INDEPENDENT_AMBULATORY_CARE_PROVIDER_SITE_OTHER): Payer: Medicare HMO

## 2012-08-18 ENCOUNTER — Ambulatory Visit (INDEPENDENT_AMBULATORY_CARE_PROVIDER_SITE_OTHER)
Admission: RE | Admit: 2012-08-18 | Discharge: 2012-08-18 | Disposition: A | Discharge status: Home / Self Care | Payer: Medicare HMO | Source: Ambulatory Visit | Attending: Internal Medicine | Admitting: Internal Medicine

## 2012-08-18 ENCOUNTER — Encounter: Payer: Self-pay | Admitting: Internal Medicine

## 2012-08-18 ENCOUNTER — Ambulatory Visit (INDEPENDENT_AMBULATORY_CARE_PROVIDER_SITE_OTHER): Payer: Medicare HMO | Admitting: General Practice

## 2012-08-18 ENCOUNTER — Ambulatory Visit: Admission: RE | Admit: 2012-08-18 | Payer: Medicare HMO | Source: Ambulatory Visit

## 2012-08-18 VITALS — BP 112/62 | HR 61 | Temp 97.0°F | Ht 61.5 in | Wt 232.5 lb

## 2012-08-18 DIAGNOSIS — C6290 Malignant neoplasm of unspecified testis, unspecified whether descended or undescended: Secondary | ICD-10-CM

## 2012-08-18 DIAGNOSIS — Z8547 Personal history of malignant neoplasm of testis: Secondary | ICD-10-CM

## 2012-08-18 DIAGNOSIS — E119 Type 2 diabetes mellitus without complications: Secondary | ICD-10-CM

## 2012-08-18 DIAGNOSIS — I4891 Unspecified atrial fibrillation: Secondary | ICD-10-CM

## 2012-08-18 DIAGNOSIS — R220 Localized swelling, mass and lump, head: Secondary | ICD-10-CM

## 2012-08-18 DIAGNOSIS — Z7901 Long term (current) use of anticoagulants: Secondary | ICD-10-CM

## 2012-08-18 DIAGNOSIS — Z954 Presence of other heart-valve replacement: Secondary | ICD-10-CM

## 2012-08-18 DIAGNOSIS — E291 Testicular hypofunction: Secondary | ICD-10-CM

## 2012-08-18 DIAGNOSIS — I10 Essential (primary) hypertension: Secondary | ICD-10-CM

## 2012-08-18 DIAGNOSIS — R221 Localized swelling, mass and lump, neck: Secondary | ICD-10-CM | POA: Insufficient documentation

## 2012-08-18 LAB — BASIC METABOLIC PANEL
BUN: 24 mg/dL — ABNORMAL HIGH (ref 6–23)
CO2: 30 mEq/L (ref 19–32)
Calcium: 9.9 mg/dL (ref 8.4–10.5)
GFR: 89.89 mL/min (ref >60.00)
Glucose, Bld: 110 mg/dL — ABNORMAL HIGH (ref 70–99)
Sodium: 138 mEq/L (ref 135–145)

## 2012-08-18 LAB — LIPID PANEL
Cholesterol: 118 mg/dL (ref 0–200)
HDL: 27.2 mg/dL — ABNORMAL LOW (ref >39.00)
VLDL: 26.6 mg/dL (ref 0.0–40.0)

## 2012-08-18 LAB — TESTOSTERONE: Testosterone: 206.14 ng/dL — ABNORMAL LOW (ref 350.00–890.00)

## 2012-08-18 LAB — HEMOGLOBIN A1C: Hgb A1c MFr Bld: 5.9 % (ref 4.6–6.5)

## 2012-08-18 MED ORDER — IOHEXOL 300 MG/ML  SOLN
76.0000 mL | Freq: Once | INTRAMUSCULAR | Status: AC | PRN
  Administered 2012-08-18: 76 mL via INTRAVENOUS

## 2012-08-18 MED ORDER — LEVOFLOXACIN 500 MG PO TABS: 500.0000 mg | ORAL_TABLET | Freq: Every day | ORAL | Status: DC

## 2012-08-18 NOTE — Patient Instructions (Addendum)
Take all new medications as prescribed - the antibiotic Continue all other medications as before Please have the pharmacy call with any other refills you may need. You will be contacted regarding the referral for: CT neck with contrast (to see [facility] now) We have to do the BUN/Cr blood tests in order to get the CT done with contrast (since it has been more than 30 days since the last blood work) You will be contacted regarding the referral for: oncology - Dr Gibson Ramp

## 2012-08-18 NOTE — Progress Notes (Signed)
Subjective:    Patient ID: Lance Cook, male    DOB: 08-29-1961, 51 y.o.   MRN: [id number]  HPI  Here to f/u swelling to left neck area, s/p zpack but did not seem to help, in fact increased in size and mild pain in last wk to orange size and incidentally less size/pain in the last day;  No fever, red skin, drainage, chills, itch, tongue swelling or dysphagia.  Has hx of testicular ca and is convinced he has some type of cancer, demanding referral to oncology.  Pt denies chest pain, increased sob or doe, wheezing, orthopnea, PND, increased LE swelling, palpitations, dizziness or syncope.   Pt denies polydipsia, polyuria, or low sugar symptoms such as weakness or confusion improved with po intake.  Pt states overall good compliance with meds, trying to follow lower cholesterol, diabetic diet, wt overall stable but little exercise however.   Pt denies new neurological symptoms such as new headache, or facial or extremity weakness or numbness.  Complicating matters is the fact his insurance will not cover him seeing any ENT in [place], and with limited funds/resources he has no transportation out of [place] such as even [employer] Past Medical History  Diagnosis Date  . Diabetes mellitus   . Hyperlipidemia   . Hypertension   . Arthritis   . Leg pain 06/28/2010  . Hiatal hernia   . Atrial fibrillation   . Reflux 11/06/09  . Myocardial infarction age 61  . Varicose veins   . Obesity   . Peripheral vascular disease   . Gout   . CAD (coronary artery disease)     Old scar inferior wall myoview, 10/2009 EF 52%  . Chronic LBP 10/08/2011  . Impaired glucose tolerance 10/08/2011  . Erectile dysfunction 10/08/2011  . Testicular cancer   . Bell's palsy   . TIA (transient ischemic attack)     age 75  . CVA (cerebral infarction)     51yo  . OSA (obstructive sleep apnea)     CPAP  . History of colon polyps   . Anemia   . Hypogonadism male 04/08/2012   Past Surgical History  Procedure Date  . Laser ablation  03/06/2010    left leg  . 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 Cholecystectomy 2011  . Tonsillectomy 1967  . Otoplasaty     bilateral  . Surgery scrotal / testicular     cancer    reports that he has been smoking Cigars.  He has never used smokeless tobacco. He reports that he drinks alcohol. He reports that he does not use illicit drugs. family history includes Cancer in his maternal aunt and maternal uncle; Diabetes in his father; Heart disease in his mother; Hypertension in his other; Stroke in his other; and Throat cancer in his mother. Allergies  Allergen Reactions  . Penicillins     REACTION: Hives   Review of Systems  Constitutional: Negative for diaphoresis and unexpected weight change.  HENT: Negative for tinnitus.   Eyes: Negative for photophobia and visual disturbance.  Respiratory: Negative for choking and stridor.   Gastrointestinal: Negative for vomiting and blood in stool.  Genitourinary: Negative for hematuria and decreased urine volume.  Musculoskeletal: Negative for gait problem.  Skin: Negative for color change and wound.  Neurological: Negative for tremors and numbness.  Psychiatric/Behavioral: Negative for decreased concentration. The patient is not hyperactive.  Objective:   Physical Exam BP 112/62  Pulse 61  Temp 97 F (36.1 C) (Oral)  Ht 5' 1.5" (1.562 m)  Wt 232 lb 8 oz (105.461 kg)  BMI 43.22 kg/m2  SpO2 97% Physical Exam  VS noted Constitutional: Pt appears well-developed and well-nourished.  HENT: Head: Normocephalic.  Right Ear: External ear normal.  Left Ear: External ear normal.  Eyes: Conjunctivae and EOM are normal. Pupils are equal, round, and reactive to light.  Neck: Normal range of motion. Neck supple Has lemon sized mild tender swollen area left anterior neck without fluctuance or drainage or overlying skin change.  Cardiovascular: Normal rate and regular  rhythm.   Pulmonary/Chest: Effort normal and breath sounds normal.  Neurological: Pt is alert. Not confused  Skin: Skin is warm. No erythema.  Psychiatric: Pt behavior is normal. Thought content normal.     Assessment & Plan:

## 2012-08-20 ENCOUNTER — Telehealth: Payer: Self-pay | Admitting: Medical Oncology

## 2012-08-20 NOTE — Telephone Encounter (Signed)
Pt called our scheduler and said he wants to see Dr Arbutus Ped because of recent CT scan ordered by Dr Oliver Barre. I called pt and he said Dr Fayrene Fearing said he needs to follow-up with oncologist for consultation . I will consult with Dr Arbutus Ped as  to follow up

## 2012-08-21 ENCOUNTER — Encounter: Payer: Self-pay | Admitting: Internal Medicine

## 2012-08-21 NOTE — Assessment & Plan Note (Signed)
With mild tenderness and decreased size in the past day, I suspect a nonmalignant issue, ? Salivary gland or similar problem, for change empiric antibx, Neck CT to further evaluate, not sure he actually needs to see oncology for this issue

## 2012-08-21 NOTE — Telephone Encounter (Signed)
Follow up in 2-3 weeks as available

## 2012-08-21 NOTE — Assessment & Plan Note (Signed)
Ok for oncology eval given his history,  to f/u any worsening symptoms or concerns

## 2012-08-21 NOTE — Assessment & Plan Note (Signed)
stable overall by hx and exam, most recent data reviewed with pt, and pt to continue medical treatment as before BP Readings from Last 3 Encounters:  08/18/12 112/62  07/15/12 104/64  04/16/12 140/80

## 2012-08-23 ENCOUNTER — Telehealth: Payer: Self-pay | Admitting: *Deleted

## 2012-08-23 NOTE — Telephone Encounter (Signed)
RN spoke with Dr Arbutus Ped. The pt needs to follow up with ENT as Dr Oliver Barre suggested. Dr Arbutus Ped will see patient if this turns out to be cancer, but an ENT or surgeon will need to biopsy the area to determine what it is. Left pt a message with this information. To call us back if he has further questions

## 2012-08-23 NOTE — Telephone Encounter (Signed)
Pt left a voice mail stating that he is having pain and it concerning him. "Would like closure as soon as possible, had restless weekend". Would like an appointment with Dr Arbutus Ped.

## 2012-08-24 ENCOUNTER — Telehealth: Payer: Self-pay | Admitting: *Deleted

## 2012-08-24 ENCOUNTER — Other Ambulatory Visit: Payer: Self-pay | Admitting: Medical Oncology

## 2012-08-24 NOTE — Telephone Encounter (Signed)
Again, I decline as this is not an appropriate use of a referral  Needs to see Oncology as per pt request at last visit

## 2012-08-24 NOTE — Telephone Encounter (Signed)
Patient needs referral to the [facility] and would like the appoint with the first cancer surgeon available. There office number is [phone] and there fax is [phone].  He would like a call when referral is faxed over

## 2012-08-24 NOTE — Telephone Encounter (Signed)
Informed the patient.  He stated he cannot go to [facility] as he has an outstanding balance.  He has spoken to his insurance and they have seen the report and notes.  The patient stated his insurance would pay for [facility].

## 2012-08-24 NOTE — Telephone Encounter (Signed)
I decline, as pt has no suggestion at this time of cancer or malignancy that might require surgury  Lipoma is type of benign lesion that can be removed by general surgury  Pt needs to see oncology first

## 2012-08-27 ENCOUNTER — Telehealth: Payer: Self-pay | Admitting: Internal Medicine

## 2012-08-27 ENCOUNTER — Other Ambulatory Visit: Payer: Self-pay | Admitting: Internal Medicine

## 2012-08-27 DIAGNOSIS — R221 Localized swelling, mass and lump, neck: Secondary | ICD-10-CM

## 2012-08-27 NOTE — Telephone Encounter (Signed)
Referral is already in process. Gavin Pound, [facility] has faxed notes to [facility] per pt request and they will contact pt.

## 2012-08-27 NOTE — Telephone Encounter (Signed)
Lance Cook, I don't know what to do with this. Can you help? Lance Cook

## 2012-08-27 NOTE — Telephone Encounter (Signed)
Misty Stanley, I put in the referral. Rene Kocher

## 2012-08-27 NOTE — Telephone Encounter (Signed)
Dr. Felicity Coyer, pt was seen on 08/18/12 for this mass. Please see Dr. Raphael Gibney OV note and 08/18/12 CT of neck/chest. Please advise on pt's request for oncology referral.

## 2012-08-27 NOTE — Telephone Encounter (Signed)
Patient is not requesting a referral to be sent to the Cancer Center for his Neck mass, fax to [facility] @ [phone]

## 2012-08-30 ENCOUNTER — Ambulatory Visit: Payer: Self-pay | Admitting: Internal Medicine

## 2012-08-31 ENCOUNTER — Ambulatory Visit: Payer: Self-pay | Admitting: Internal Medicine

## 2012-08-31 LAB — COMPREHENSIVE METABOLIC PANEL
Alkaline Phosphatase: 78 U/L (ref 50–136)
Anion Gap: 6 — ABNORMAL LOW (ref 7–16)
BUN: 16 mg/dL (ref 7–18)
Bilirubin,Total: 0.8 mg/dL (ref 0.2–1.0)
Calcium, Total: 9.3 mg/dL (ref 8.5–10.1)
Co2: 30 mmol/L (ref 21–32)
Creatinine: 0.75 mg/dL (ref 0.60–1.30)
EGFR (Non-African Amer.): >60
Osmolality: 279 (ref 275–301)
Potassium: 4.2 mmol/L (ref 3.5–5.1)
SGOT(AST): 29 U/L (ref 15–37)
Sodium: 139 mmol/L (ref 136–145)
Total Protein: 8 g/dL (ref 6.4–8.2)

## 2012-08-31 LAB — CBC CANCER CENTER
Eosinophil %: 2 %
HGB: 15.8 g/dL (ref 13.0–18.0)
Lymphocyte %: 18 %
MCH: 29.2 pg (ref 26.0–34.0)
Monocyte #: 0.6 x10 3/mm (ref 0.2–1.0)
Monocyte %: 7.9 %
Neutrophil %: 71.2 %
Platelet: 120 x10 3/mm — ABNORMAL LOW (ref 150–440)
RBC: 5.4 10*6/uL (ref 4.40–5.90)
WBC: 7.8 x10 3/mm (ref 3.8–10.6)

## 2012-08-31 LAB — LACTATE DEHYDROGENASE: LDH: 286 U/L — ABNORMAL HIGH (ref 85–241)

## 2012-08-31 LAB — PROTIME-INR
INR: 2.9
Prothrombin Time: 30.6 secs — ABNORMAL HIGH (ref 11.5–14.7)

## 2012-08-31 LAB — APTT: Activated PTT: 48.5 secs — ABNORMAL HIGH (ref 23.6–35.9)

## 2012-09-01 ENCOUNTER — Ambulatory Visit: Payer: Self-pay | Admitting: Internal Medicine

## 2012-09-08 ENCOUNTER — Telehealth: Payer: Self-pay

## 2012-09-08 NOTE — Telephone Encounter (Signed)
Dr. Ophelia Charter office ENT called.  They need MD's ok to stop coumadin 5 days prior to surgery and to start lovenox.  If ok lovenox would need to be sent in to pharmacy.  Call back Becky at Dr. Burna Forts [phone] please advise with all instructions.

## 2012-09-08 NOTE — Telephone Encounter (Signed)
Becky informed of MD instructions

## 2012-09-08 NOTE — Telephone Encounter (Signed)
Ok with me, will ask Bailey Mech to assist and be involved

## 2012-09-09 ENCOUNTER — Ambulatory Visit: Payer: Self-pay | Admitting: Otolaryngology

## 2012-09-10 ENCOUNTER — Other Ambulatory Visit: Payer: Self-pay | Admitting: General Practice

## 2012-09-10 ENCOUNTER — Telehealth: Payer: Self-pay | Admitting: General Practice

## 2012-09-10 ENCOUNTER — Telehealth: Payer: Self-pay | Admitting: *Deleted

## 2012-09-10 MED ORDER — ENOXAPARIN SODIUM 100 MG/ML ~~LOC~~ SOLN: 100.0000 mg | Freq: Two times a day (BID) | SUBCUTANEOUS | Status: DC

## 2012-09-10 NOTE — Telephone Encounter (Signed)
Spoke with patient to let him know that [facility] on [address] can fill the Lovenox syringes.

## 2012-09-10 NOTE — Telephone Encounter (Signed)
1/11 - Last dose of coumadin 1/12 - Do not take coumadin or Lovenox 1/13 - Take Lovenox in am and pm - No coumadin 1/14 - Take Lovenox in am and pm - No coumadin 1/15 - Take Lovenox in am only - No coumadin 1/16 - Surgery - Do not take coumadin or Lovenox 1/17 - Take 10 mg coumadin and Lovenox in am and pm 1/18 - Take 10 mg coumadin and Lovenox in am and pm 1/19 - Take 7.5 mg coumadin and Lovenox in am and pm 1/20 - Take 7.5 mg coumadin and Lovenox in am and pm 1/21 - Return to coumadin clinic.

## 2012-09-10 NOTE — Telephone Encounter (Signed)
[facility] called regarding Lovenox rx. They do not have Lovenox available at this time. They will have it available at the earliest, Monday 09/13/2012 around 11am. They want to know if pt needs right away, if so they will transfer rx to another pharmacy for pt.

## 2012-09-10 NOTE — Telephone Encounter (Signed)
1/11 - Take last dose of coumadin. 1/12 - Do not take coumadin and Do not take Lovenox 1/13 - Take Lovenox in AM and PM 1/14 - Take Lovenox in AM and PM 1/15 - Take Lovenox in AM only 1/16 - Surgery - Do not take anything. 1/17 - Take 10 mg of coumadin and Lovenox in AM and PM 1/18 - Take 10 mg of coumadin and Lovenox in AM and PM 1/19 - Take 7.5 mg of coumadin and Lovenox in AM and PM 1/20 - Take 7.5 mg of coumadin and Lovenox in AM and PM 1/21 - Re-check in coumadin Clinic.

## 2012-09-10 NOTE — Telephone Encounter (Signed)
1/11 - Last dose of coumadin 1/12 - No coumadin and No Lovenox 1/13 - Lovenox in am and pm - No coumadin 1/14 - Lovenox in am and pm - No coumadin 1/15 - Lovenox in am ONLY 1/16 - surgery 1/17 - 10 mg coumadin and Lovenox in am and pm 1/18 - 10 mg coumadin and Lovenox in am and pm 1/19 - 7.5 mg coumadin and Lovenox in am and pm 1/20 - 7.5 mg coumadin and Lovenox in am and pm 1/21 - Re-check INR in coumadin clinic.

## 2012-09-10 NOTE — Telephone Encounter (Signed)
To forward to cindy who is handling this

## 2012-09-16 ENCOUNTER — Ambulatory Visit: Payer: Self-pay | Admitting: Otolaryngology

## 2012-09-16 LAB — CBC WITH DIFFERENTIAL/PLATELET
Basophil #: 0 10*3/uL (ref 0.0–0.1)
Basophil %: 0.4 %
HCT: 41 % (ref 40.0–52.0)
HGB: 14 g/dL (ref 13.0–18.0)
Lymphocyte %: 3.6 %
MCH: 29.3 pg (ref 26.0–34.0)
MCHC: 34 g/dL (ref 32.0–36.0)
MCV: 86 fL (ref 80–100)
Monocyte #: 0.8 x10 3/mm (ref 0.2–1.0)
WBC: 12 10*3/uL — ABNORMAL HIGH (ref 3.8–10.6)

## 2012-09-16 LAB — BASIC METABOLIC PANEL
Anion Gap: 7 (ref 7–16)
BUN: 21 mg/dL — ABNORMAL HIGH (ref 7–18)
Co2: 25 mmol/L (ref 21–32)
Creatinine: 0.82 mg/dL (ref 0.60–1.30)
EGFR (African American): >60
Osmolality: 282 (ref 275–301)

## 2012-09-16 LAB — PROTIME-INR: INR: 1.2

## 2012-09-17 LAB — HEMOGLOBIN: HGB: 14.1 g/dL (ref 13.0–18.0)

## 2012-09-19 LAB — BASIC METABOLIC PANEL
Anion Gap: 7 (ref 7–16)
BUN: 25 mg/dL — ABNORMAL HIGH (ref 7–18)
Calcium, Total: 8.6 mg/dL (ref 8.5–10.1)
Co2: 27 mmol/L (ref 21–32)
EGFR (African American): >60
EGFR (Non-African Amer.): >60
Glucose: 108 mg/dL — ABNORMAL HIGH (ref 65–99)
Potassium: 4.3 mmol/L (ref 3.5–5.1)
Sodium: 140 mmol/L (ref 136–145)

## 2012-09-19 LAB — CBC
HGB: 13 g/dL (ref 13.0–18.0)
Platelet: 103 10*3/uL — ABNORMAL LOW (ref 150–440)
RBC: 4.48 10*6/uL (ref 4.40–5.90)
RDW: 18.8 % — ABNORMAL HIGH (ref 11.5–14.5)
WBC: 11.2 10*3/uL — ABNORMAL HIGH (ref 3.8–10.6)

## 2012-09-19 LAB — PROTIME-INR
INR: 1.4
Prothrombin Time: 17.7 secs — ABNORMAL HIGH (ref 11.5–14.7)

## 2012-09-20 LAB — BASIC METABOLIC PANEL
Calcium, Total: 9.1 mg/dL (ref 8.5–10.1)
Chloride: 103 mmol/L (ref 98–107)
Creatinine: 0.8 mg/dL (ref 0.60–1.30)
EGFR (African American): >60
EGFR (Non-African Amer.): >60
Glucose: 100 mg/dL — ABNORMAL HIGH (ref 65–99)
Sodium: 136 mmol/L (ref 136–145)

## 2012-09-20 LAB — CBC WITH DIFFERENTIAL/PLATELET
Basophil #: 0 10*3/uL (ref 0.0–0.1)
Eosinophil #: 0.2 10*3/uL (ref 0.0–0.7)
HCT: 40.3 % (ref 40.0–52.0)
HGB: 13.7 g/dL (ref 13.0–18.0)
MCH: 29.6 pg (ref 26.0–34.0)
Monocyte #: 1 x10 3/mm (ref 0.2–1.0)
Monocyte %: 10.1 %
Neutrophil %: 69.2 %
Platelet: 103 10*3/uL — ABNORMAL LOW (ref 150–440)
WBC: 10 10*3/uL (ref 3.8–10.6)

## 2012-09-20 LAB — PROTIME-INR: INR: 1.6

## 2012-09-21 ENCOUNTER — Inpatient Hospital Stay: Payer: Self-pay | Admitting: Otolaryngology

## 2012-09-21 LAB — PROTIME-INR
INR: 1.4
Prothrombin Time: 17.9 secs — ABNORMAL HIGH (ref 11.5–14.7)

## 2012-09-21 LAB — BASIC METABOLIC PANEL
BUN: 16 mg/dL (ref 7–18)
Chloride: 102 mmol/L (ref 98–107)
Co2: 28 mmol/L (ref 21–32)
Creatinine: 0.72 mg/dL (ref 0.60–1.30)
Potassium: 4.1 mmol/L (ref 3.5–5.1)

## 2012-09-21 LAB — CBC WITH DIFFERENTIAL/PLATELET
Basophil #: 0.1 10*3/uL (ref 0.0–0.1)
Eosinophil #: 0.2 10*3/uL (ref 0.0–0.7)
HCT: 43.2 % (ref 40.0–52.0)
Lymphocyte #: 1.9 10*3/uL (ref 1.0–3.6)
MCHC: 34 g/dL (ref 32.0–36.0)
MCV: 86 fL (ref 80–100)
Monocyte #: 0.8 x10 3/mm (ref 0.2–1.0)
Neutrophil #: 6.6 10*3/uL — ABNORMAL HIGH (ref 1.4–6.5)
Neutrophil %: 68.8 %
Platelet: 119 10*3/uL — ABNORMAL LOW (ref 150–440)
RDW: 18.5 % — ABNORMAL HIGH (ref 11.5–14.5)
WBC: 9.6 10*3/uL (ref 3.8–10.6)

## 2012-09-22 ENCOUNTER — Telehealth: Payer: Self-pay | Admitting: General Practice

## 2012-09-22 LAB — BASIC METABOLIC PANEL
Anion Gap: 10 (ref 7–16)
BUN: 17 mg/dL (ref 7–18)
Calcium, Total: 9.1 mg/dL (ref 8.5–10.1)
Creatinine: 0.65 mg/dL (ref 0.60–1.30)
EGFR (African American): >60
EGFR (Non-African Amer.): >60
Glucose: 107 mg/dL — ABNORMAL HIGH (ref 65–99)
Osmolality: 274 (ref 275–301)
Potassium: 4 mmol/L (ref 3.5–5.1)
Sodium: 136 mmol/L (ref 136–145)

## 2012-09-22 LAB — CBC WITH DIFFERENTIAL/PLATELET
Basophil %: 0.9 %
Eosinophil #: 0.2 10*3/uL (ref 0.0–0.7)
HCT: 43.2 % (ref 40.0–52.0)
HGB: 14.9 g/dL (ref 13.0–18.0)
Lymphocyte #: 1.7 10*3/uL (ref 1.0–3.6)
Lymphocyte %: 16 %
MCV: 86 fL (ref 80–100)
Monocyte #: 0.9 x10 3/mm (ref 0.2–1.0)
Monocyte %: 8 %

## 2012-09-22 LAB — PROTIME-INR
INR: 2.9
Prothrombin Time: 30.7 secs — ABNORMAL HIGH (ref 11.5–14.7)

## 2012-09-22 NOTE — Telephone Encounter (Signed)
LMOM for patient to call coumadin clinic and re-schedule appointment.

## 2012-09-23 LAB — PROTIME-INR
INR: 1.9
Prothrombin Time: 22.3 secs — ABNORMAL HIGH (ref 11.5–14.7)

## 2012-09-24 ENCOUNTER — Telehealth: Payer: Self-pay | Admitting: General Practice

## 2012-09-24 NOTE — Telephone Encounter (Signed)
Spoke with patient's wife.   Does not want to schedule follow up appointment in coumadin clinic at this time.

## 2012-09-24 NOTE — Telephone Encounter (Signed)
I believe pt is being cared for by oncology at [facility], as this was the last referral on record  Robin to call for all recent record from dec 2013 to date  

## 2012-09-24 NOTE — Telephone Encounter (Signed)
Telephoned patient today to re-schedule follow up appointment in coumadin clinic.  Pt's wife angrily stated that pt just got out of the hospital yesterday 1/23.  Wife was upset and said that pt had to have two more surgeries because of the way the Lovenox and coumadin were dosed??? Wife stated that patient's face swelled up on Saturday evening and pt had to go back to hospital.   Wife could not tell me whether or not pt is still on Lovenox and couldn't tell me how much coumadin pt is taking.  Wife also stated that they had problems picking up Lovenox.  I specifically called [facility] on [address] to make sure they had the syringes.  I then spoke with patient to let him know that and he stated that that was fine and he was on his way to pick them up.  I told wife that if patient is unable to come to coumadin clinic that I cannot safely prescribe coumadin for him.  Wife took that as a threat and stated that she [place] unhappy with patient's care.  Please advise.  I believe patient went to [facility] for surgery so I can't access notes.

## 2012-09-24 NOTE — Telephone Encounter (Signed)
I believe pt is being cared for by oncology at [facility], as this was the last referral on record  Robin to call for all recent record from dec 2013 to date

## 2012-09-27 NOTE — Telephone Encounter (Signed)
Called [facility].  They are sending records.

## 2012-09-28 ENCOUNTER — Ambulatory Visit (INDEPENDENT_AMBULATORY_CARE_PROVIDER_SITE_OTHER): Payer: Medicare HMO | Admitting: General Practice

## 2012-09-28 DIAGNOSIS — Z954 Presence of other heart-valve replacement: Secondary | ICD-10-CM

## 2012-09-28 DIAGNOSIS — I4891 Unspecified atrial fibrillation: Secondary | ICD-10-CM

## 2012-09-28 DIAGNOSIS — Z7901 Long term (current) use of anticoagulants: Secondary | ICD-10-CM

## 2012-10-04 ENCOUNTER — Ambulatory Visit: Payer: Self-pay | Admitting: Internal Medicine

## 2012-10-16 ENCOUNTER — Other Ambulatory Visit: Payer: Self-pay

## 2012-10-27 ENCOUNTER — Telehealth: Payer: Self-pay | Admitting: Internal Medicine

## 2012-10-27 MED ORDER — OXYCODONE-ACETAMINOPHEN 5-325 MG PO TABS: 1.0000 | ORAL_TABLET | Freq: Three times a day (TID) | ORAL | Status: DC | PRN

## 2012-10-27 NOTE — Telephone Encounter (Signed)
Done hardcopy to [facility]  

## 2012-10-27 NOTE — Telephone Encounter (Signed)
Patients wife is requesting 3 month refill of Percocet, call when ready for pick up, patient is out of the medication currently

## 2012-10-28 NOTE — Telephone Encounter (Signed)
Called the patient informed to pickup hardcopy's requested at the front desk.

## 2012-10-30 ENCOUNTER — Ambulatory Visit: Payer: Self-pay | Admitting: Internal Medicine

## 2012-11-16 ENCOUNTER — Telehealth: Payer: Self-pay

## 2012-11-16 MED ORDER — DILTIAZEM HCL ER COATED BEADS 120 MG PO CP24: 120.0000 mg | ORAL_CAPSULE | Freq: Every day | ORAL | Status: DC

## 2012-11-16 NOTE — Telephone Encounter (Signed)
Pharmacy request refill for Niacin TR 500 mg #90 to [facility] please advise not on current medication list.

## 2012-11-16 NOTE — Telephone Encounter (Signed)
I would not take this due to recent study results:  SATURDAY, March 9 [facility] News) -- Combining the vitamin niacin with a cholesterol-lowering statin drug appears to offer patients no benefit and may also increase side effects, a new study indicates.  It's a disappointing result from the largest-ever study of niacin for heart patients, which involved almost 26,000 people.  In the study, patients who added the B-vitamin to the statin drug Zocor saw no added benefit in terms of reductions in heart-related death, non-fatal heart attack, stroke, or the need for angioplasty or bypass surgeries.  The study also found that people taking niacin had more incidents of bleeding and/or infections than those who were taking an inactive placebo, according to a team reporting Saturday at the annual meeting of the [employer] of Cardiology, in [place].

## 2012-11-17 ENCOUNTER — Telehealth: Payer: Self-pay

## 2012-11-17 ENCOUNTER — Other Ambulatory Visit: Payer: Self-pay | Admitting: General Practice

## 2012-11-17 MED ORDER — ATENOLOL 100 MG PO TABS: 100.0000 mg | ORAL_TABLET | Freq: Every day | ORAL | Status: DC

## 2012-11-17 MED ORDER — ALLOPURINOL 300 MG PO TABS: 300.0000 mg | ORAL_TABLET | Freq: Every day | ORAL | Status: DC

## 2012-11-17 MED ORDER — COUMADIN 5 MG PO TABS: ORAL_TABLET | ORAL | Status: DC

## 2012-11-17 NOTE — Telephone Encounter (Signed)
Called the patient informed of MD instructions on medication request.

## 2012-11-17 NOTE — Telephone Encounter (Signed)
refills  

## 2012-11-17 NOTE — Telephone Encounter (Signed)
Called the patient left message to call back 

## 2012-11-23 ENCOUNTER — Other Ambulatory Visit: Payer: Self-pay

## 2012-11-23 ENCOUNTER — Telehealth: Payer: Self-pay

## 2012-11-23 MED ORDER — POTASSIUM CHLORIDE ER 10 MEQ PO TBCR: 10.0000 meq | EXTENDED_RELEASE_TABLET | Freq: Every day | ORAL | Status: DC

## 2012-11-23 MED ORDER — DIGOXIN 250 MCG PO TABS: 250.0000 ug | ORAL_TABLET | Freq: Every day | ORAL | Status: DC

## 2012-11-23 NOTE — Telephone Encounter (Signed)
Called the patient informed phoned in to [facility] on 11/22/12 refills on Digoxin 0.25 and and klor con 10 #90 with 3 refills. Canceled the niacin rx per 11/16/12 phone note Dr. Jonny Ruiz instructions on medication.

## 2012-11-23 NOTE — Telephone Encounter (Signed)
Prescriptions sent in to [facility]

## 2012-11-23 NOTE — Telephone Encounter (Signed)
Message copied by Pincus Sanes on Tue Nov 23, 2012  8:34 AM ------      Message from: Etheleen Sia      Created: Mon Nov 22, 2012  4:51 PM      Regarding: REFILLS       Wife called to say more RX's are due than the 3-4 in the chart and that none are at the pharmacy.  He is out of heart medicine. ------

## 2013-01-12 ENCOUNTER — Ambulatory Visit: Payer: Medicare HMO | Admitting: Internal Medicine

## 2013-03-01 ENCOUNTER — Ambulatory Visit: Payer: Self-pay | Admitting: Internal Medicine

## 2013-03-02 LAB — CBC CANCER CENTER
Comment - H1-Com2: NORMAL
HCT: 44.5 % (ref 40.0–52.0)
Lymphocytes: 12 %
MCHC: 33.9 g/dL (ref 32.0–36.0)
MCV: 87 fL (ref 80–100)
Monocytes: 8 %
Platelet: 126 x10 3/mm — ABNORMAL LOW (ref 150–440)
RBC: 5.15 10*6/uL (ref 4.40–5.90)
Segmented Neutrophils: 77 %

## 2013-03-02 LAB — FIBRIN DEGRADATION PROD.(ARMC ONLY): Fibrin Degradation Prod.: >10 ug/ml — ABNORMAL HIGH (ref 2.1–7.7)

## 2013-03-02 LAB — IRON AND TIBC
Iron: 105 ug/dL (ref 65–175)
Unbound Iron-Bind.Cap.: 251 ug/dL

## 2013-03-02 LAB — FIBRINOGEN: Fibrinogen: 329 mg/dL (ref 210–470)

## 2013-03-02 LAB — PROTIME-INR
INR: 3.1
Prothrombin Time: 30.6 secs — ABNORMAL HIGH (ref 11.5–14.7)

## 2013-03-02 LAB — RETICULOCYTES: Absolute Retic Count: 0.1385 10*6/uL

## 2013-03-02 LAB — LACTATE DEHYDROGENASE: LDH: 331 U/L — ABNORMAL HIGH (ref 85–241)

## 2013-03-02 LAB — FERRITIN: Ferritin (ARMC): 195 ng/mL (ref 8–388)

## 2013-03-03 LAB — URINE IEP, RANDOM

## 2013-03-10 LAB — PROT IMMUNOELECTROPHORES(ARMC)

## 2013-03-25 ENCOUNTER — Ambulatory Visit (INDEPENDENT_AMBULATORY_CARE_PROVIDER_SITE_OTHER): Payer: Medicare HMO | Admitting: General Practice

## 2013-04-01 ENCOUNTER — Ambulatory Visit: Payer: Self-pay | Admitting: Internal Medicine

## 2013-04-06 ENCOUNTER — Telehealth: Payer: Self-pay

## 2013-04-06 ENCOUNTER — Telehealth: Payer: Self-pay | Admitting: Cardiology

## 2013-04-06 NOTE — Telephone Encounter (Signed)
New Prob      Pt states he would like to get an ECHO done. No order in EPIC to schedule.

## 2013-04-06 NOTE — Telephone Encounter (Signed)
Received 09/20/12 echo done at [facility].Will put report in Dr.[name]'s mail box.

## 2013-04-06 NOTE — Telephone Encounter (Signed)
Return call to patient he stated he had a echo at [facility] 09/2012.Stated he has appointment with Dr.[name] 04/21/13 and would like echo report sent to Dr.[name] before appointment.Faxed request to [facility] medical records.Fax # [phone].

## 2013-04-08 ENCOUNTER — Other Ambulatory Visit: Payer: Self-pay

## 2013-04-08 DIAGNOSIS — D509 Iron deficiency anemia, unspecified: Secondary | ICD-10-CM

## 2013-04-08 DIAGNOSIS — Z1211 Encounter for screening for malignant neoplasm of colon: Secondary | ICD-10-CM

## 2013-04-08 MED ORDER — INTEGRA 62.5-62.5-40-3 MG PO CAPS: 1.0000 | ORAL_CAPSULE | Freq: Every day | ORAL | Status: DC

## 2013-04-14 ENCOUNTER — Telehealth: Payer: Self-pay | Admitting: Cardiology

## 2013-04-14 NOTE — Telephone Encounter (Signed)
New Problem   Pt wants to be sure that the Dr. has recieved the copy of an ultra sound before the scheduled appt date// pt requests a call back for confirmation.

## 2013-04-14 NOTE — Telephone Encounter (Signed)
Pt aware we do have the results

## 2013-04-21 ENCOUNTER — Ambulatory Visit (INDEPENDENT_AMBULATORY_CARE_PROVIDER_SITE_OTHER): Payer: Medicare HMO | Admitting: Cardiology

## 2013-04-21 ENCOUNTER — Encounter: Payer: Self-pay | Admitting: Cardiology

## 2013-04-21 VITALS — BP 144/82 | HR 79 | Ht 61.5 in | Wt 243.0 lb

## 2013-04-21 DIAGNOSIS — I4891 Unspecified atrial fibrillation: Secondary | ICD-10-CM

## 2013-04-21 DIAGNOSIS — I2510 Atherosclerotic heart disease of native coronary artery without angina pectoris: Secondary | ICD-10-CM

## 2013-04-21 DIAGNOSIS — Z954 Presence of other heart-valve replacement: Secondary | ICD-10-CM
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
## 2013-04-21 NOTE — Patient Instructions (Addendum)
The current medical regimen is effective;  continue present plan and medications.  Your physician has requested that you have an echocardiogram in 1/ 2015.  Echocardiography is a painless test that uses sound waves to create images of your heart. It provides your doctor with information about the size and shape of your heart and how well your heart's chambers and valves are working. This procedure takes approximately one hour. There are no restrictions for this procedure.  Follow up in 1 year with Dr Antoine Poche.  You will receive a letter in the mail 2 months before you are due.  Please call us when you receive this letter to schedule your follow up appointment.

## 2013-05-04 ENCOUNTER — Ambulatory Visit: Payer: Self-pay | Admitting: Anesthesiology

## 2013-05-05 ENCOUNTER — Telehealth: Payer: Self-pay | Admitting: Cardiology

## 2013-05-05 NOTE — Telephone Encounter (Signed)
New Prob    Pt has some questions regarding a procedure he has coming up. Please call.

## 2013-05-05 NOTE — Telephone Encounter (Signed)
New Prob ° ° ° °Pt has some questions regarding a procedure he has coming up. Please call. °

## 2013-05-05 NOTE — Telephone Encounter (Signed)
Left pt a message to call back. 

## 2013-05-06 NOTE — Telephone Encounter (Signed)
Needs medical clearance for steroid injections. Does not want to bridged with Lovenox if he doesn't have to be because he hemorrhaged last time he was bridged.  Aware I will forward to MD for review and orders.

## 2013-05-06 NOTE — Telephone Encounter (Signed)
Can he please tell me the last time he was bridged so that I can review this.

## 2013-05-10 NOTE — Telephone Encounter (Signed)
I left a message for the patient to call back to see when he was last bridged.

## 2013-05-11 NOTE — Telephone Encounter (Signed)
Follow Up  ° °Returning call.  °

## 2013-05-11 NOTE — Telephone Encounter (Signed)
Spoke with patient who states he needs an ESI and the doctor would like him off blood thinner at least 5 days prior to procedure.  Patient's INR is followed by his PCP in [facility].  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  If patient is bridged, the doctor performing the ESI wants the patient off the Lovenox 1 day prior to the procedure.  I advised patient that Dr. Antoine Poche is out of the office until next week.  Patient verbalized agreement with waiting for his return and requests a call back as soon as possible Monday as he is awaiting Dr. Jenene Slicker recommendation before scheduling.

## 2013-05-24 NOTE — Telephone Encounter (Signed)
New Prob     PT states he needs clearance for a procedure and regulation on how to use his LOVENOX. Please call.

## 2013-05-26 NOTE — Telephone Encounter (Signed)
Follow Up:  PT states he is calling regarding Lovonox therapy. Pt would like to be called back with advise on this and an upcoming surgery.

## 2013-05-26 NOTE — Telephone Encounter (Signed)
New Prob     Pts wife states pt has some questions regarding an upcoming surgery he has, and would like Dr. Jenene Slicker input. Please call.

## 2013-05-26 NOTE — Telephone Encounter (Signed)
Pt aware Dr Antoine Poche will call him tonight.

## 2013-05-26 NOTE — Telephone Encounter (Signed)
The patient is concerned about his Lovenox bridge and coming off of warfarin for a spinal injection.  He had problems with this in the past when he did this.  This was managed through his PCP.  I told him that for the bridging we would see him in the Coumadin clinic.  We will call him to arrange this. He will call and get a date for the procedure.  Therefore, based on ACC/AHA guidelines, the patient would be at acceptable risk for the planned procedure without further cardiovascular testing.

## 2013-05-26 NOTE — Telephone Encounter (Signed)
Follow up   Pt need to speak to nurse concerning previous messages. Need to speak to someone asap today.

## 2013-05-27 NOTE — Telephone Encounter (Signed)
Will forward to Weston Brass to schedule and follow up with pt.

## 2013-05-27 NOTE — Telephone Encounter (Signed)
Spoke with pt. Answered his questions regarding Lovenox bridge.  He still has 1 1/2 boxes of 100mg  syringes at home he can use.  His procedure has not been scheduled at this time.  Will fax clearance to Dr. Pernell Dupre in order to get a date set.

## 2013-06-02 NOTE — Telephone Encounter (Signed)
Pt has an appt to come in for Lovenox at the end of October.

## 2013-06-02 NOTE — Telephone Encounter (Signed)
New Problem  Dr. Pernell Dupre has received the clearance. Surgery is scheduled for Nov 4 @ 2:30. Pt needs all of the pre op information on how to move forward. Please advise

## 2013-06-24 ENCOUNTER — Ambulatory Visit (INDEPENDENT_AMBULATORY_CARE_PROVIDER_SITE_OTHER): Payer: Medicare HMO | Admitting: Pharmacist

## 2013-06-24 DIAGNOSIS — I4891 Unspecified atrial fibrillation: Secondary | ICD-10-CM

## 2013-06-24 DIAGNOSIS — Z954 Presence of other heart-valve replacement: Secondary | ICD-10-CM

## 2013-06-24 DIAGNOSIS — Z7901 Long term (current) use of anticoagulants: Secondary | ICD-10-CM

## 2013-06-24 LAB — POCT INR: INR: 2.4

## 2013-06-24 MED ORDER — ENOXAPARIN SODIUM 100 MG/ML ~~LOC~~ SOLN: 100.0000 mg | Freq: Two times a day (BID) | SUBCUTANEOUS | Status: DC

## 2013-06-24 NOTE — Patient Instructions (Addendum)
INR today: 2.4  Continue normal dose of 1 tablet every day except 1 1/2 tablets on Sunday and Thursday until 10/30.    10/30- Take last dose of Coumadin  10/31- No Lovenox or Coumadin  11/1- Lovenox 100mg  in AM and PM 11/2- Lovenox 100mg  in AM and PM 11/3- Lovenox 100mg  in AM only 11/4- Day of Procedure.  Do not take any Lovenox or Coumadin  11/5- Lovenox 100mg  in AM and PM and Coumadin 1 1/2 tablets  11/6- Lovenox 100mg  in AM and PM and Coumadin 2 tablets  11/7- Lovenox 100mg  in AM and PM and Coumadin 1 tablet 11/8- Lovenox 100mg  in AM and PM and Coumadin 1 tablet 11/9- Lovenox 100mg  in AM and PM and Coumadin 1 1/2 tablets  11/10- Recheck INR

## 2013-07-06 ENCOUNTER — Telehealth: Payer: Self-pay | Admitting: Cardiology

## 2013-07-06 NOTE — Telephone Encounter (Signed)
LMTCB 07/06/13 @2 :15pm/PE  Unable to reach patient at this time to get further information. Unclear from chart what procedure patient may be referring to at this time.  Routed to Dr. Ramond Marrow, RN

## 2013-07-06 NOTE — Telephone Encounter (Signed)
New Problem  Pt didn't go for the procedure that he was being monitored// his insurance wouldn't cover it// request a call back to discuss.

## 2013-07-06 NOTE — Telephone Encounter (Signed)
Turns out the MD that was going to do his epidural doesn't take his insurance.  Pt had to cancel the procedure.  He will let us know if and when he gets it rescheduled

## 2013-07-07 ENCOUNTER — Other Ambulatory Visit: Payer: Self-pay

## 2013-09-02 ENCOUNTER — Other Ambulatory Visit (HOSPITAL_COMMUNITY): Payer: Medicare HMO

## 2013-09-09 ENCOUNTER — Telehealth: Payer: Self-pay | Admitting: Cardiology

## 2013-09-09 NOTE — Telephone Encounter (Signed)
Called spoke with pt, advised pt we cannot refill Coumadin rx since we have not been monitoring pt's INR's.  Pt states he has been having PT/INR drawn at [facility], [place] has been managing, but they state since he came to [place] to do Lovenox bridging they have turned over Coumadin management to [place].  Pt states they drew lab in December, and call and told him INR therapeutic, and to stay on same dosage, but they refuse to refill Coumadin and pt is out.  Offered pt appt today, but pt states the earliest he can come in is Monday 09/12/13. Pt requests we call primary care MD to inquire.    Called Dr Berniece Andreas Kahn's office spoke with nurse advised of above situation and concern of pt out of Coumadin and has mechanical valve and risk of stroke, clot or even death.  We have not dosed pt's Coumadin, rx Coumadin for pt, or drawn labs on pt.  Last monitoring MD of record is Dr Chancy Milroy.  Advised we will be happy to assume care and monitoring of pt's INR in the future if they wish to refer to cardiology given his heart condition, but we cannot rx Coumadin without establishing care for him in our clinic.  Requested she discuss with MD/PA and call back with referral if they want [place] to follow, and call in enough Coumadin to a local pharmacy in the meantime to get him to this appt.  Or if they wish to continue to monitor pt's INRs please refill Coumadin as pt is currently out of medication.

## 2013-09-09 NOTE — Telephone Encounter (Signed)
New Prob    Pt is requesting a new prescription of Coumadin 3 month supply 45 per month. Pt states he would like to speak to someone regarding this, please call.

## 2013-09-13 ENCOUNTER — Ambulatory Visit (HOSPITAL_COMMUNITY): Payer: Medicare HMO | Attending: Cardiology | Admitting: Radiology

## 2013-09-13 DIAGNOSIS — I4891 Unspecified atrial fibrillation: Secondary | ICD-10-CM | POA: Insufficient documentation

## 2013-09-13 DIAGNOSIS — I359 Nonrheumatic aortic valve disorder, unspecified: Secondary | ICD-10-CM

## 2013-09-13 DIAGNOSIS — I079 Rheumatic tricuspid valve disease, unspecified: Secondary | ICD-10-CM | POA: Insufficient documentation

## 2013-09-13 DIAGNOSIS — Z8673 Personal history of transient ischemic attack (TIA), and cerebral infarction without residual deficits: Secondary | ICD-10-CM | POA: Insufficient documentation

## 2013-09-13 DIAGNOSIS — I10 Essential (primary) hypertension: Secondary | ICD-10-CM | POA: Insufficient documentation

## 2013-09-13 DIAGNOSIS — Z954 Presence of other heart-valve replacement: Secondary | ICD-10-CM | POA: Insufficient documentation

## 2013-09-13 DIAGNOSIS — I059 Rheumatic mitral valve disease, unspecified: Secondary | ICD-10-CM | POA: Insufficient documentation

## 2013-09-13 DIAGNOSIS — Z951 Presence of aortocoronary bypass graft: Secondary | ICD-10-CM | POA: Insufficient documentation

## 2013-09-13 DIAGNOSIS — I2510 Atherosclerotic heart disease of native coronary artery without angina pectoris: Secondary | ICD-10-CM | POA: Insufficient documentation

## 2013-09-13 DIAGNOSIS — I447 Left bundle-branch block, unspecified: Secondary | ICD-10-CM | POA: Insufficient documentation

## 2013-09-13 DIAGNOSIS — I2789 Other specified pulmonary heart diseases: Secondary | ICD-10-CM | POA: Insufficient documentation

## 2013-09-13 DIAGNOSIS — E119 Type 2 diabetes mellitus without complications: Secondary | ICD-10-CM | POA: Insufficient documentation

## 2013-09-13 NOTE — Progress Notes (Signed)
Echocardiogram performed.  

## 2013-09-16 NOTE — Telephone Encounter (Signed)
Spoke with pt.  He was able to get everything worked out with his PCP to continue INR monitoring with their office and they did sent a refill in for pt.

## 2013-11-22 ENCOUNTER — Telehealth: Payer: Self-pay | Admitting: Cardiology

## 2013-11-22 NOTE — Telephone Encounter (Signed)
Spoke with patient. Pt states that he has been on brand Coumadin for more than 20 years. Because of insurance pt wants to change from Coumadin to warfarin.  Pt concerned because several years ago he had CVA on warfarin. His Coumadin is currently monitored at his PCP. He would like Dr [name]'s thoughts about changing from brand name Coumadin to warfarin. I will forward to Dr [name] for review.

## 2013-11-22 NOTE — Telephone Encounter (Signed)
addendum    Wife wants Dr. [name] to answer this questions verse the nurse. Aware that Jeannene Patella is off today

## 2013-11-22 NOTE — Telephone Encounter (Signed)
New message    Wife calling    Wants to switch to warfarin from coumadin. Wife is asking would this be safe .please advise.

## 2013-11-22 NOTE — Telephone Encounter (Signed)
There would be no contraindication to generic.  He will need close follow up with his PCP to ensure therapeutic INRs.

## 2013-11-23 NOTE — Telephone Encounter (Signed)
Per aware OK to use generic and he will be having PT/INRs check more closely.

## 2013-12-09 ENCOUNTER — Ambulatory Visit: Payer: Self-pay | Admitting: Pain Medicine

## 2013-12-09 LAB — HEPATIC FUNCTION PANEL A (ARMC)
ALK PHOS: 56 U/L
Albumin: 3.7 g/dL (ref 3.4–5.0)
Bilirubin, Direct: 0.2 mg/dL (ref 0.00–0.20)
Bilirubin,Total: 0.9 mg/dL (ref 0.2–1.0)
SGOT(AST): 29 U/L (ref 15–37)
SGPT (ALT): 26 U/L (ref 12–78)
TOTAL PROTEIN: 7.1 g/dL (ref 6.4–8.2)

## 2013-12-09 LAB — BASIC METABOLIC PANEL
Anion Gap: 4 — ABNORMAL LOW (ref 7–16)
BUN: 22 mg/dL — AB (ref 7–18)
CHLORIDE: 104 mmol/L (ref 98–107)
CO2: 30 mmol/L (ref 21–32)
Calcium, Total: 9.2 mg/dL (ref 8.5–10.1)
Creatinine: 0.88 mg/dL (ref 0.60–1.30)
EGFR (African American): >60
EGFR (Non-African Amer.): >60
Glucose: 85 mg/dL (ref 65–99)
Osmolality: 278 (ref 275–301)
POTASSIUM: 4.3 mmol/L (ref 3.5–5.1)
SODIUM: 138 mmol/L (ref 136–145)

## 2013-12-09 LAB — SEDIMENTATION RATE: Erythrocyte Sed Rate: 2 mm/hr (ref 0–20)

## 2013-12-09 LAB — MAGNESIUM: MAGNESIUM: 1.6 mg/dL — AB

## 2013-12-19 ENCOUNTER — Other Ambulatory Visit: Payer: Self-pay | Admitting: Internal Medicine

## 2013-12-19 MED ORDER — DILTIAZEM HCL ER COATED BEADS 120 MG PO CP24: 120.0000 mg | ORAL_CAPSULE | Freq: Every day | ORAL | Status: DC

## 2013-12-19 MED ORDER — ATENOLOL 100 MG PO TABS: 100.0000 mg | ORAL_TABLET | Freq: Every day | ORAL | Status: DC

## 2013-12-21 ENCOUNTER — Ambulatory Visit: Payer: Self-pay | Admitting: Pain Medicine

## 2014-01-17 ENCOUNTER — Ambulatory Visit: Payer: Self-pay | Admitting: Pain Medicine

## 2014-02-02 ENCOUNTER — Ambulatory Visit (INDEPENDENT_AMBULATORY_CARE_PROVIDER_SITE_OTHER): Payer: Medicare HMO | Admitting: *Deleted

## 2014-02-02 DIAGNOSIS — I4891 Unspecified atrial fibrillation: Secondary | ICD-10-CM

## 2014-02-02 DIAGNOSIS — Z954 Presence of other heart-valve replacement: Secondary | ICD-10-CM

## 2014-02-02 DIAGNOSIS — Z5181 Encounter for therapeutic drug level monitoring: Secondary | ICD-10-CM

## 2014-02-02 LAB — POCT INR: INR: 2.5

## 2014-02-02 MED ORDER — ENOXAPARIN SODIUM 100 MG/ML ~~LOC~~ SOLN: 100.0000 mg | Freq: Two times a day (BID) | SUBCUTANEOUS | Status: DC

## 2014-02-02 NOTE — Patient Instructions (Addendum)
INR today: 2.5   6/4- No Lovenox or Coumadin  6/5- Start taking Lovenox 100mg  in AM and PM  6/6- Lovenox 100mg  in AM and PM  6/7- Lovenox 100mg  in AM and PM 6/8- Take your last dosage of Lovenox 100mg  in the AM, No Lovenox in the PM prior to procedure on 6/9  6/9- Day of Procedure. Do not take any Lovenox or Coumadin  6/10- Lovenox 100mg  in AM and PM and Coumadin 1 1/2 tablets  6/11- Lovenox 100mg  in AM and PM and Coumadin 2 tablets  6/12- Lovenox 100mg  in AM and PM and Coumadin 1 tablet  6/13- Lovenox 100mg  in AM and PM and Coumadin 1 tablet  6/14- Lovenox 100mg  in AM and PM and Coumadin 1 1/2 tablets  6/15- Recheck INR

## 2014-02-07 ENCOUNTER — Ambulatory Visit (INDEPENDENT_AMBULATORY_CARE_PROVIDER_SITE_OTHER): Payer: Medicare HMO | Admitting: *Deleted

## 2014-02-07 ENCOUNTER — Ambulatory Visit: Payer: Self-pay | Admitting: Pain Medicine

## 2014-02-07 DIAGNOSIS — Z954 Presence of other heart-valve replacement: Secondary | ICD-10-CM

## 2014-02-07 DIAGNOSIS — Z5181 Encounter for therapeutic drug level monitoring: Secondary | ICD-10-CM

## 2014-02-07 DIAGNOSIS — I4891 Unspecified atrial fibrillation: Secondary | ICD-10-CM

## 2014-02-07 LAB — POCT INR: INR: 1.3

## 2014-02-07 NOTE — Patient Instructions (Signed)
6/9- Day of Procedure. Do not take any Lovenox or Coumadin  6/10- Lovenox 100mg  in AM and PM and Coumadin 1 1/2 tablets  6/11- Lovenox 100mg  in AM and PM and Coumadin 2 tablets  6/12- Lovenox 100mg  in AM and PM and Coumadin 1 tablet  6/13- Lovenox 100mg  in AM and PM and Coumadin 1 tablet  6/14- Lovenox 100mg  in AM and PM and Coumadin 1 1/2 tablets  6/15- Recheck INR

## 2014-02-13 ENCOUNTER — Ambulatory Visit (INDEPENDENT_AMBULATORY_CARE_PROVIDER_SITE_OTHER): Payer: Medicare HMO

## 2014-02-13 DIAGNOSIS — Z954 Presence of other heart-valve replacement: Secondary | ICD-10-CM

## 2014-02-13 DIAGNOSIS — Z5181 Encounter for therapeutic drug level monitoring: Secondary | ICD-10-CM

## 2014-02-13 DIAGNOSIS — I4891 Unspecified atrial fibrillation: Secondary | ICD-10-CM

## 2014-02-13 LAB — POCT INR: INR: 1.5

## 2014-02-15 ENCOUNTER — Ambulatory Visit (INDEPENDENT_AMBULATORY_CARE_PROVIDER_SITE_OTHER): Payer: Medicare HMO | Admitting: Pharmacist

## 2014-02-15 DIAGNOSIS — Z5181 Encounter for therapeutic drug level monitoring: Secondary | ICD-10-CM

## 2014-02-15 DIAGNOSIS — I4891 Unspecified atrial fibrillation: Secondary | ICD-10-CM

## 2014-02-15 DIAGNOSIS — Z954 Presence of other heart-valve replacement: Secondary | ICD-10-CM

## 2014-02-15 LAB — POCT INR: INR: 2

## 2014-02-20 ENCOUNTER — Ambulatory Visit (INDEPENDENT_AMBULATORY_CARE_PROVIDER_SITE_OTHER): Payer: Medicare HMO | Admitting: Pharmacist

## 2014-02-20 DIAGNOSIS — Z954 Presence of other heart-valve replacement: Secondary | ICD-10-CM

## 2014-02-20 DIAGNOSIS — I4891 Unspecified atrial fibrillation: Secondary | ICD-10-CM

## 2014-02-20 DIAGNOSIS — Z5181 Encounter for therapeutic drug level monitoring: Secondary | ICD-10-CM

## 2014-02-20 LAB — POCT INR: INR: 2

## 2014-02-23 DIAGNOSIS — Z8673 Personal history of transient ischemic attack (TIA), and cerebral infarction without residual deficits: Secondary | ICD-10-CM | POA: Insufficient documentation

## 2014-02-23 DIAGNOSIS — Z952 Presence of prosthetic heart valve: Secondary | ICD-10-CM | POA: Insufficient documentation

## 2014-02-23 DIAGNOSIS — I10 Essential (primary) hypertension: Secondary | ICD-10-CM | POA: Insufficient documentation

## 2014-02-23 DIAGNOSIS — R591 Generalized enlarged lymph nodes: Secondary | ICD-10-CM | POA: Insufficient documentation

## 2014-02-23 DIAGNOSIS — C6290 Malignant neoplasm of unspecified testis, unspecified whether descended or undescended: Secondary | ICD-10-CM | POA: Insufficient documentation

## 2014-02-23 DIAGNOSIS — I776 Arteritis, unspecified: Secondary | ICD-10-CM | POA: Insufficient documentation

## 2014-02-27 ENCOUNTER — Ambulatory Visit (INDEPENDENT_AMBULATORY_CARE_PROVIDER_SITE_OTHER): Payer: Medicare HMO | Admitting: Pharmacist

## 2014-02-27 DIAGNOSIS — Z954 Presence of other heart-valve replacement: Secondary | ICD-10-CM

## 2014-02-27 DIAGNOSIS — I4891 Unspecified atrial fibrillation: Secondary | ICD-10-CM

## 2014-02-27 DIAGNOSIS — Z5181 Encounter for therapeutic drug level monitoring: Secondary | ICD-10-CM

## 2014-02-27 LAB — POCT INR: INR: 2.8

## 2014-03-08 ENCOUNTER — Ambulatory Visit (INDEPENDENT_AMBULATORY_CARE_PROVIDER_SITE_OTHER): Payer: Medicare HMO | Admitting: *Deleted

## 2014-03-08 ENCOUNTER — Ambulatory Visit: Payer: Self-pay | Admitting: Pain Medicine

## 2014-03-08 DIAGNOSIS — Z5181 Encounter for therapeutic drug level monitoring: Secondary | ICD-10-CM

## 2014-03-08 DIAGNOSIS — Z954 Presence of other heart-valve replacement: Secondary | ICD-10-CM

## 2014-03-08 DIAGNOSIS — I4891 Unspecified atrial fibrillation: Secondary | ICD-10-CM

## 2014-03-08 LAB — POCT INR: INR: 3.4

## 2014-03-14 DIAGNOSIS — E1169 Type 2 diabetes mellitus with other specified complication: Secondary | ICD-10-CM

## 2014-03-14 DIAGNOSIS — E119 Type 2 diabetes mellitus without complications: Secondary | ICD-10-CM | POA: Insufficient documentation

## 2014-03-20 ENCOUNTER — Other Ambulatory Visit (HOSPITAL_COMMUNITY): Payer: Self-pay | Admitting: Cardiology

## 2014-03-20 ENCOUNTER — Ambulatory Visit (HOSPITAL_COMMUNITY): Payer: Medicare HMO | Attending: Cardiovascular Disease | Admitting: Radiology

## 2014-03-20 DIAGNOSIS — E119 Type 2 diabetes mellitus without complications: Secondary | ICD-10-CM | POA: Insufficient documentation

## 2014-03-20 DIAGNOSIS — I517 Cardiomegaly: Secondary | ICD-10-CM | POA: Insufficient documentation

## 2014-03-20 DIAGNOSIS — I25119 Atherosclerotic heart disease of native coronary artery with unspecified angina pectoris: Secondary | ICD-10-CM

## 2014-03-20 DIAGNOSIS — I359 Nonrheumatic aortic valve disorder, unspecified: Secondary | ICD-10-CM | POA: Insufficient documentation

## 2014-03-20 DIAGNOSIS — E785 Hyperlipidemia, unspecified: Secondary | ICD-10-CM | POA: Insufficient documentation

## 2014-03-20 DIAGNOSIS — I079 Rheumatic tricuspid valve disease, unspecified: Secondary | ICD-10-CM | POA: Insufficient documentation

## 2014-03-20 DIAGNOSIS — I739 Peripheral vascular disease, unspecified: Secondary | ICD-10-CM | POA: Insufficient documentation

## 2014-03-20 DIAGNOSIS — G4733 Obstructive sleep apnea (adult) (pediatric): Secondary | ICD-10-CM | POA: Insufficient documentation

## 2014-03-20 DIAGNOSIS — I4891 Unspecified atrial fibrillation: Secondary | ICD-10-CM

## 2014-03-20 DIAGNOSIS — Z8673 Personal history of transient ischemic attack (TIA), and cerebral infarction without residual deficits: Secondary | ICD-10-CM | POA: Insufficient documentation

## 2014-03-20 DIAGNOSIS — I447 Left bundle-branch block, unspecified: Secondary | ICD-10-CM | POA: Insufficient documentation

## 2014-03-20 DIAGNOSIS — I10 Essential (primary) hypertension: Secondary | ICD-10-CM | POA: Insufficient documentation

## 2014-03-20 DIAGNOSIS — I059 Rheumatic mitral valve disease, unspecified: Secondary | ICD-10-CM | POA: Insufficient documentation

## 2014-03-20 NOTE — Progress Notes (Signed)
Echocardiogram performed.  

## 2014-03-22 ENCOUNTER — Ambulatory Visit (INDEPENDENT_AMBULATORY_CARE_PROVIDER_SITE_OTHER): Payer: Medicare HMO | Admitting: *Deleted

## 2014-03-22 DIAGNOSIS — Z954 Presence of other heart-valve replacement: Secondary | ICD-10-CM

## 2014-03-22 DIAGNOSIS — I4891 Unspecified atrial fibrillation: Secondary | ICD-10-CM

## 2014-03-22 DIAGNOSIS — Z5181 Encounter for therapeutic drug level monitoring: Secondary | ICD-10-CM

## 2014-03-22 LAB — POCT INR: INR: 3.1

## 2014-03-23 ENCOUNTER — Telehealth: Payer: Self-pay

## 2014-03-23 MED ORDER — WARFARIN SODIUM 5 MG PO TABS: ORAL_TABLET | ORAL | Status: DC

## 2014-03-23 NOTE — Telephone Encounter (Signed)
Pt aware Rx sent.  

## 2014-04-02 ENCOUNTER — Emergency Department (HOSPITAL_COMMUNITY): Payer: Medicare HMO

## 2014-04-02 ENCOUNTER — Inpatient Hospital Stay (HOSPITAL_COMMUNITY)
Admission: EM | Admit: 2014-04-02 | Discharge: 2014-04-05 | DRG: 872 | Disposition: A | Discharge status: Home / Self Care | Payer: Medicare HMO | Attending: Internal Medicine | Admitting: Internal Medicine

## 2014-04-02 ENCOUNTER — Encounter (HOSPITAL_COMMUNITY): Payer: Self-pay | Admitting: Emergency Medicine

## 2014-04-02 DIAGNOSIS — E291 Testicular hypofunction: Secondary | ICD-10-CM | POA: Diagnosis present

## 2014-04-02 DIAGNOSIS — L03119 Cellulitis of unspecified part of limb: Secondary | ICD-10-CM | POA: Diagnosis present

## 2014-04-02 DIAGNOSIS — Z794 Long term (current) use of insulin: Secondary | ICD-10-CM

## 2014-04-02 DIAGNOSIS — M545 Low back pain, unspecified: Secondary | ICD-10-CM | POA: Diagnosis present

## 2014-04-02 DIAGNOSIS — Z8547 Personal history of malignant neoplasm of testis: Secondary | ICD-10-CM | POA: Diagnosis not present

## 2014-04-02 DIAGNOSIS — Z79899 Other long term (current) drug therapy: Secondary | ICD-10-CM

## 2014-04-02 DIAGNOSIS — M479 Spondylosis, unspecified: Secondary | ICD-10-CM

## 2014-04-02 DIAGNOSIS — I10 Essential (primary) hypertension: Secondary | ICD-10-CM

## 2014-04-02 DIAGNOSIS — G4733 Obstructive sleep apnea (adult) (pediatric): Secondary | ICD-10-CM

## 2014-04-02 DIAGNOSIS — I4891 Unspecified atrial fibrillation: Secondary | ICD-10-CM | POA: Diagnosis present

## 2014-04-02 DIAGNOSIS — Z6841 Body Mass Index (BMI) 40.0 and over, adult: Secondary | ICD-10-CM

## 2014-04-02 DIAGNOSIS — I9589 Other hypotension: Secondary | ICD-10-CM

## 2014-04-02 DIAGNOSIS — I480 Paroxysmal atrial fibrillation: Secondary | ICD-10-CM

## 2014-04-02 DIAGNOSIS — E119 Type 2 diabetes mellitus without complications: Secondary | ICD-10-CM

## 2014-04-02 DIAGNOSIS — K648 Other hemorrhoids: Secondary | ICD-10-CM

## 2014-04-02 DIAGNOSIS — Z833 Family history of diabetes mellitus: Secondary | ICD-10-CM

## 2014-04-02 DIAGNOSIS — Z954 Presence of other heart-valve replacement: Secondary | ICD-10-CM | POA: Diagnosis not present

## 2014-04-02 DIAGNOSIS — Z86010 Personal history of colonic polyps: Secondary | ICD-10-CM

## 2014-04-02 DIAGNOSIS — D509 Iron deficiency anemia, unspecified: Secondary | ICD-10-CM

## 2014-04-02 DIAGNOSIS — Z880 Allergy status to penicillin: Secondary | ICD-10-CM

## 2014-04-02 DIAGNOSIS — I2510 Atherosclerotic heart disease of native coronary artery without angina pectoris: Secondary | ICD-10-CM | POA: Diagnosis present

## 2014-04-02 DIAGNOSIS — Z8249 Family history of ischemic heart disease and other diseases of the circulatory system: Secondary | ICD-10-CM | POA: Diagnosis not present

## 2014-04-02 DIAGNOSIS — E785 Hyperlipidemia, unspecified: Secondary | ICD-10-CM | POA: Diagnosis present

## 2014-04-02 DIAGNOSIS — Z8673 Personal history of transient ischemic attack (TIA), and cerebral infarction without residual deficits: Secondary | ICD-10-CM | POA: Diagnosis not present

## 2014-04-02 DIAGNOSIS — I739 Peripheral vascular disease, unspecified: Secondary | ICD-10-CM | POA: Diagnosis present

## 2014-04-02 DIAGNOSIS — I252 Old myocardial infarction: Secondary | ICD-10-CM

## 2014-04-02 DIAGNOSIS — A419 Sepsis, unspecified organism: Principal | ICD-10-CM | POA: Diagnosis present

## 2014-04-02 DIAGNOSIS — I482 Chronic atrial fibrillation, unspecified: Secondary | ICD-10-CM

## 2014-04-02 DIAGNOSIS — L02419 Cutaneous abscess of limb, unspecified: Secondary | ICD-10-CM

## 2014-04-02 DIAGNOSIS — Z860101 Personal history of adenomatous and serrated colon polyps: Secondary | ICD-10-CM

## 2014-04-02 DIAGNOSIS — L03116 Cellulitis of left lower limb: Secondary | ICD-10-CM

## 2014-04-02 DIAGNOSIS — L97909 Non-pressure chronic ulcer of unspecified part of unspecified lower leg with unspecified severity: Secondary | ICD-10-CM

## 2014-04-02 DIAGNOSIS — Z7901 Long term (current) use of anticoagulants: Secondary | ICD-10-CM | POA: Diagnosis not present

## 2014-04-02 DIAGNOSIS — I8390 Asymptomatic varicose veins of unspecified lower extremity: Secondary | ICD-10-CM

## 2014-04-02 DIAGNOSIS — Z5181 Encounter for therapeutic drug level monitoring: Secondary | ICD-10-CM

## 2014-04-02 DIAGNOSIS — R252 Cramp and spasm: Secondary | ICD-10-CM

## 2014-04-02 DIAGNOSIS — R221 Localized swelling, mass and lump, neck: Secondary | ICD-10-CM

## 2014-04-02 DIAGNOSIS — M48061 Spinal stenosis, lumbar region without neurogenic claudication: Secondary | ICD-10-CM

## 2014-04-02 DIAGNOSIS — F17200 Nicotine dependence, unspecified, uncomplicated: Secondary | ICD-10-CM | POA: Diagnosis present

## 2014-04-02 DIAGNOSIS — I639 Cerebral infarction, unspecified: Secondary | ICD-10-CM

## 2014-04-02 DIAGNOSIS — G8929 Other chronic pain: Secondary | ICD-10-CM | POA: Diagnosis present

## 2014-04-02 DIAGNOSIS — L0390 Cellulitis, unspecified: Secondary | ICD-10-CM

## 2014-04-02 DIAGNOSIS — I959 Hypotension, unspecified: Secondary | ICD-10-CM | POA: Diagnosis present

## 2014-04-02 DIAGNOSIS — R6510 Systemic inflammatory response syndrome (SIRS) of non-infectious origin without acute organ dysfunction: Secondary | ICD-10-CM | POA: Diagnosis present

## 2014-04-02 DIAGNOSIS — I87319 Chronic venous hypertension (idiopathic) with ulcer of unspecified lower extremity: Secondary | ICD-10-CM

## 2014-04-02 DIAGNOSIS — R220 Localized swelling, mass and lump, head: Secondary | ICD-10-CM

## 2014-04-02 DIAGNOSIS — I872 Venous insufficiency (chronic) (peripheral): Secondary | ICD-10-CM | POA: Diagnosis present

## 2014-04-02 DIAGNOSIS — M109 Gout, unspecified: Secondary | ICD-10-CM

## 2014-04-02 DIAGNOSIS — R609 Edema, unspecified: Secondary | ICD-10-CM

## 2014-04-02 DIAGNOSIS — D696 Thrombocytopenia, unspecified: Secondary | ICD-10-CM

## 2014-04-02 DIAGNOSIS — I2584 Coronary atherosclerosis due to calcified coronary lesion: Secondary | ICD-10-CM

## 2014-04-02 DIAGNOSIS — Z0000 Encounter for general adult medical examination without abnormal findings: Secondary | ICD-10-CM

## 2014-04-02 HISTORY — DX: Morbid (severe) obesity due to excess calories: E66.01

## 2014-04-02 HISTORY — DX: Cellulitis, unspecified: L03.90

## 2014-04-02 LAB — COMPREHENSIVE METABOLIC PANEL
ALK PHOS: 42 U/L (ref 39–117)
ALT: 20 U/L (ref 0–53)
AST: 23 U/L (ref 0–37)
Albumin: 3.6 g/dL (ref 3.5–5.2)
Anion gap: 13 (ref 5–15)
BUN: 15 mg/dL (ref 6–23)
CALCIUM: 8.9 mg/dL (ref 8.4–10.5)
CO2: 27 mEq/L (ref 19–32)
Chloride: 96 mEq/L (ref 96–112)
Creatinine, Ser: 0.97 mg/dL (ref 0.50–1.35)
GFR calc Af Amer: >90 mL/min (ref >90)
GLUCOSE: 144 mg/dL — AB (ref 70–99)
Potassium: 3.7 mEq/L (ref 3.7–5.3)
SODIUM: 136 meq/L — AB (ref 137–147)
Total Bilirubin: 1.2 mg/dL (ref 0.3–1.2)
Total Protein: 6.4 g/dL (ref 6.0–8.3)

## 2014-04-02 LAB — URINALYSIS, ROUTINE W REFLEX MICROSCOPIC
BILIRUBIN URINE: NEGATIVE
Glucose, UA: NEGATIVE mg/dL
HGB URINE DIPSTICK: NEGATIVE
Ketones, ur: NEGATIVE mg/dL
Leukocytes, UA: NEGATIVE
Nitrite: NEGATIVE
PROTEIN: 30 mg/dL — AB
Specific Gravity, Urine: 1.02 (ref 1.005–1.030)
UROBILINOGEN UA: 0.2 mg/dL (ref 0.0–1.0)
pH: 5 (ref 5.0–8.0)

## 2014-04-02 LAB — CBC WITH DIFFERENTIAL/PLATELET
Basophils Absolute: 0 10*3/uL (ref 0.0–0.1)
Basophils Relative: 0 % (ref 0–1)
Eosinophils Absolute: 0.1 10*3/uL (ref 0.0–0.7)
Eosinophils Relative: 0 % (ref 0–5)
HEMATOCRIT: 44 % (ref 39.0–52.0)
HEMOGLOBIN: 14.6 g/dL (ref 13.0–17.0)
LYMPHS PCT: 4 % — AB (ref 12–46)
Lymphs Abs: 0.5 10*3/uL — ABNORMAL LOW (ref 0.7–4.0)
MCH: 29 pg (ref 26.0–34.0)
MCHC: 33.2 g/dL (ref 30.0–36.0)
MCV: 87.5 fL (ref 78.0–100.0)
MONO ABS: 0.6 10*3/uL (ref 0.1–1.0)
Monocytes Relative: 4 % (ref 3–12)
Neutro Abs: 13.3 10*3/uL — ABNORMAL HIGH (ref 1.7–7.7)
Neutrophils Relative %: 92 % — ABNORMAL HIGH (ref 43–77)
PLATELETS: 119 10*3/uL — AB (ref 150–400)
RBC: 5.03 MIL/uL (ref 4.22–5.81)
RDW: 16.4 % — ABNORMAL HIGH (ref 11.5–15.5)
WBC: 14.5 10*3/uL — AB (ref 4.0–10.5)

## 2014-04-02 LAB — PROTIME-INR
INR: 3.83 — AB (ref 0.00–1.49)
PROTHROMBIN TIME: 37.7 s — AB (ref 11.6–15.2)

## 2014-04-02 LAB — I-STAT CG4 LACTIC ACID, ED
LACTIC ACID, VENOUS: 1.87 mmol/L (ref 0.5–2.2)
Lactic Acid, Venous: 1.31 mmol/L (ref 0.5–2.2)

## 2014-04-02 LAB — URINE MICROSCOPIC-ADD ON

## 2014-04-02 MED ORDER — SODIUM CHLORIDE 0.9 % IV BOLUS (SEPSIS)
1000.0000 mL | Freq: Once | INTRAVENOUS | Status: AC
  Administered 2014-04-02: 1000 mL via INTRAVENOUS

## 2014-04-02 MED ORDER — DEXTROSE 5 % IV SOLN
2.0000 g | Freq: Once | INTRAVENOUS | Status: AC
  Administered 2014-04-02: 2 g via INTRAVENOUS
  Filled 2014-04-02: qty 2

## 2014-04-02 MED ORDER — SODIUM CHLORIDE 0.9 % IV BOLUS (SEPSIS): 30.0000 mL/kg | Freq: Once | INTRAVENOUS | Status: DC

## 2014-04-02 MED ORDER — WARFARIN - PHARMACIST DOSING INPATIENT: Freq: Every day | Status: DC

## 2014-04-02 MED ORDER — ONDANSETRON HCL 4 MG PO TABS: 4.0000 mg | ORAL_TABLET | Freq: Four times a day (QID) | ORAL | Status: DC | PRN

## 2014-04-02 MED ORDER — ACETAMINOPHEN 650 MG RE SUPP: 650.0000 mg | Freq: Four times a day (QID) | RECTAL | Status: DC | PRN

## 2014-04-02 MED ORDER — WARFARIN SODIUM 5 MG PO TABS: 5.0000 mg | ORAL_TABLET | ORAL | Status: DC

## 2014-04-02 MED ORDER — INSULIN ASPART 100 UNIT/ML ~~LOC~~ SOLN: 0.0000 [IU] | Freq: Every day | SUBCUTANEOUS | Status: DC

## 2014-04-02 MED ORDER — METHOCARBAMOL 500 MG PO TABS
500.0000 mg | ORAL_TABLET | Freq: Every day | ORAL | Status: DC | PRN
  Administered 2014-04-03: 500 mg via ORAL
  Filled 2014-04-02 (×2): qty 1

## 2014-04-02 MED ORDER — ACETAMINOPHEN 325 MG PO TABS: 650.0000 mg | ORAL_TABLET | Freq: Four times a day (QID) | ORAL | Status: DC | PRN

## 2014-04-02 MED ORDER — TEMAZEPAM 15 MG PO CAPS
15.0000 mg | ORAL_CAPSULE | Freq: Every evening | ORAL | Status: DC | PRN
  Administered 2014-04-03 – 2014-04-04 (×3): 15 mg via ORAL
  Filled 2014-04-02 (×3): qty 1

## 2014-04-02 MED ORDER — METRONIDAZOLE IN NACL 5-0.79 MG/ML-% IV SOLN
500.0000 mg | Freq: Once | INTRAVENOUS | Status: AC
  Administered 2014-04-02: 500 mg via INTRAVENOUS
  Filled 2014-04-02: qty 100

## 2014-04-02 MED ORDER — POTASSIUM CHLORIDE ER 10 MEQ PO TBCR
10.0000 meq | EXTENDED_RELEASE_TABLET | Freq: Every day | ORAL | Status: DC
  Filled 2014-04-02: qty 1

## 2014-04-02 MED ORDER — VANCOMYCIN HCL IN DEXTROSE 1-5 GM/200ML-% IV SOLN
1000.0000 mg | Freq: Two times a day (BID) | INTRAVENOUS | Status: DC
  Administered 2014-04-03 – 2014-04-05 (×5): 1000 mg via INTRAVENOUS
  Filled 2014-04-02 (×6): qty 200

## 2014-04-02 MED ORDER — ALUM & MAG HYDROXIDE-SIMETH 200-200-20 MG/5ML PO SUSP: 30.0000 mL | Freq: Four times a day (QID) | ORAL | Status: DC | PRN

## 2014-04-02 MED ORDER — INSULIN ASPART 100 UNIT/ML ~~LOC~~ SOLN: 0.0000 [IU] | Freq: Three times a day (TID) | SUBCUTANEOUS | Status: DC

## 2014-04-02 MED ORDER — DEXTROSE 5 % IV SOLN
1.0000 g | Freq: Three times a day (TID) | INTRAVENOUS | Status: DC
  Administered 2014-04-03 – 2014-04-05 (×7): 1 g via INTRAVENOUS
  Filled 2014-04-02 (×8): qty 1

## 2014-04-02 MED ORDER — VANCOMYCIN HCL IN DEXTROSE 1-5 GM/200ML-% IV SOLN
1000.0000 mg | Freq: Once | INTRAVENOUS | Status: AC
  Administered 2014-04-02: 1000 mg via INTRAVENOUS
  Filled 2014-04-02: qty 200

## 2014-04-02 MED ORDER — SODIUM CHLORIDE 0.9 % IV SOLN: 1000.0000 mL | INTRAVENOUS | Status: DC

## 2014-04-02 MED ORDER — SODIUM CHLORIDE 0.9 % IV SOLN: INTRAVENOUS | Status: AC

## 2014-04-02 MED ORDER — WARFARIN SODIUM 7.5 MG PO TABS: 7.5000 mg | ORAL_TABLET | ORAL | Status: DC

## 2014-04-02 MED ORDER — ONDANSETRON HCL 4 MG/2ML IJ SOLN: 4.0000 mg | Freq: Four times a day (QID) | INTRAMUSCULAR | Status: DC | PRN

## 2014-04-02 MED ORDER — PANTOPRAZOLE SODIUM 40 MG PO TBEC
40.0000 mg | DELAYED_RELEASE_TABLET | Freq: Every day | ORAL | Status: DC
  Administered 2014-04-03 – 2014-04-05 (×3): 40 mg via ORAL
  Filled 2014-04-02 (×3): qty 1

## 2014-04-02 MED ORDER — OXYCODONE HCL 5 MG PO TABS
5.0000 mg | ORAL_TABLET | ORAL | Status: DC | PRN
  Administered 2014-04-03 – 2014-04-05 (×5): 5 mg via ORAL
  Filled 2014-04-02 (×5): qty 1

## 2014-04-02 MED ORDER — HYDROMORPHONE HCL PF 1 MG/ML IJ SOLN: 0.5000 mg | INTRAMUSCULAR | Status: DC | PRN

## 2014-04-02 MED ORDER — ALLOPURINOL 300 MG PO TABS
300.0000 mg | ORAL_TABLET | Freq: Every day | ORAL | Status: DC
  Administered 2014-04-03 – 2014-04-05 (×3): 300 mg via ORAL
  Filled 2014-04-02 (×3): qty 1

## 2014-04-02 MED ORDER — METRONIDAZOLE IN NACL 5-0.79 MG/ML-% IV SOLN
500.0000 mg | Freq: Three times a day (TID) | INTRAVENOUS | Status: DC
  Administered 2014-04-03: 500 mg via INTRAVENOUS
  Filled 2014-04-02 (×3): qty 100

## 2014-04-02 MED ORDER — DIGOXIN 250 MCG PO TABS
0.2500 mg | ORAL_TABLET | Freq: Every day | ORAL | Status: DC
  Administered 2014-04-03 – 2014-04-05 (×3): 0.25 mg via ORAL
  Filled 2014-04-02 (×3): qty 1

## 2014-04-02 NOTE — H&P (Signed)
[facility] Admission History and Physical       Susana Gripp NGE:[id number] DOB: March 14, 1961 DOA: 04/02/2014  Referring physician:  EDP PCP: No PCP Per Patient  Specialists:   Chief Complaint: Redness and Pain of left Thigh and fevers and Chills  HPI: Lance Cook is a 53 y.o. 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    He has complaints of fevers and chills.   He was evaluated in the ED and found to have a  Cellulitis of the left thigh and was placed on IV Vancomycin Aztreonam and Metronidazole .  He began to have hypotension in the ED, and was administered IVFs, and referred for medical admission.      Review of Systems:  Constitutional: No Weight Loss, No Weight Gain, Night Sweats, +Fevers, +Chills, Dizziness, Fatigue, or Generalized Weakness HEENT: No Headaches, Difficulty Swallowing,Tooth/Dental Problems,Sore Throat,  No Sneezing, Rhinitis, Ear Ache, Nasal Congestion, or Post Nasal Drip,  Cardio-vascular:  No Chest pain, Orthopnea, PND, +Edema in Lower Extremities, Anasarca, Dizziness, Palpitations  Resp: No Dyspnea, No DOE, No Productive Cough, No Non-Productive Cough, No Hemoptysis, No Wheezing.    GI: No Heartburn, Indigestion, Abdominal Pain, Nausea, Vomiting, Diarrhea, Hematemesis, Hematochezia, Melena, Change in Bowel Habits,  Loss of Appetite  GU: No Dysuria, Change in Color of Urine, No Urgency or Frequency, No Flank pain.  Musculoskeletal: No Joint Pain or Swelling, No Decreased Range of Motion, No Back Pain.  Neurologic: No Syncope, No Seizures, Muscle Weakness, Paresthesia, Vision Disturbance or Loss, No Diplopia, No Vertigo, No Difficulty Walking,  Skin: +Redness and Swelling of Left Thigh, Chronic Hyperpigmentation of the BLEs.    Psych: No Change in Mood or Affect, No Depression or Anxiety, No Memory loss, No  Confusion, or Hallucinations   Past Medical History  Diagnosis Date  . Diabetes mellitus   . Hyperlipidemia   . Hypertension   . Arthritis   . Leg pain 06/28/2010  . Hiatal hernia   . Atrial fibrillation   . Reflux 11/06/09  . Myocardial infarction age 30  . Varicose veins   . Obesity   . Peripheral vascular disease   . Gout   . CAD (coronary artery disease)     Old scar inferior wall myoview, 10/2009 EF 52%  . Chronic LBP 10/08/2011  . Impaired glucose tolerance 10/08/2011  . Erectile dysfunction 10/08/2011  . Testicular cancer   . Bell's palsy   . TIA (transient ischemic attack)     age 80  . CVA (cerebral infarction)     53yo  . OSA (obstructive sleep apnea)     CPAP  . History of colon polyps   . Anemia   . Hypogonadism male 04/08/2012  . Aortic aneurysm      Past Surgical History  Procedure Laterality Date  . Laser ablation  03/06/2010    left leg  . Cardiac valve replacement      aortic valve replaced mechanical age 44 after trauma, SVG to RCA at that time  . Cholecystectomy  2011  . Tonsillectomy  1967  . Otoplasty      bilateral  . Surgery scrotal / testicular      cancer      Prior to Admission medications   Medication Sig Start Date End Date Taking? Authorizing Provider  allopurinol (ZYLOPRIM) 300 MG tablet  Take 300 mg by mouth daily. 11/17/12  Yes Biagio Borg, MD  Ascorbic Acid (VITAMIN C) 1000 MG tablet Take 1,000 mg by mouth daily.     Yes Historical Provider, MD  atenolol (TENORMIN) 100 MG tablet Take 100 mg by mouth daily. Take 100 mg tablet in morning and 1/2 tablet (50mg ) in the evening. 12/19/13  Yes Biagio Borg, MD  b complex vitamins tablet Take 1 tablet by mouth daily.     Yes Historical Provider, MD  Calcium-Magnesium-Zinc (CAL-MAG-ZINC PO) Take by mouth daily.     Yes Historical Provider, MD  digoxin (LANOXIN) 0.25 MG tablet Take 0.25 mg by mouth daily.   Yes Historical Provider, MD  diltiazem (DILACOR XR) 120 MG 24 hr capsule Take 120 mg by  mouth daily.   Yes Historical Provider, MD  esomeprazole (NEXIUM) 40 MG capsule Take 40 mg by mouth daily at 12 noon.   Yes Historical Provider, MD  furosemide (LASIX) 40 MG tablet Take 20-40 mg by mouth daily as needed for fluid. 10/08/11  Yes Biagio Borg, MD  methocarbamol (ROBAXIN) 500 MG tablet Take 500 mg by mouth daily as needed for muscle spasms. 10/08/11  Yes Biagio Borg, MD  oxyCODONE-acetaminophen (PERCOCET/ROXICET) 5-325 MG per tablet Take 1 tablet by mouth every 8 (eight) hours as needed for pain. To fill Dec 24, 2012 10/27/12  Yes Biagio Borg, MD  temazepam (RESTORIL) 15 MG capsule Take 15 mg by mouth at bedtime as needed for sleep.   Yes Historical Provider, MD  vitamin E (VITAMIN E) 400 UNIT capsule Take 400 Units by mouth daily.     Yes Historical Provider, MD  warfarin (COUMADIN) 5 MG tablet Take 5 mg by mouth See admin instructions. Take on Tuesdays, Thursdays and saturdays.. 03/23/14 03/22/16 Yes [name], MD  warfarin (COUMADIN) 7.5 MG tablet Take 7.5 mg by mouth See admin instructions. Take on Mondays, Wednesdays, Fridays, and Sundays   Yes Historical Provider, MD  digoxin (LANOXIN) 0.25 MG tablet Take 1 tablet (250 mcg total) by mouth daily. 11/23/12 11/23/13  Biagio Borg, MD  INSULIN SYRINGE .5CC/29G (B-D INS SYR ULTRAFINE .5CC/29G) 29G X 1/2" 0.5 ML MISC 1 application by Does not apply route once a week. 04/08/12 04/08/14  Biagio Borg, MD  potassium chloride (KLOR-CON 10) 10 MEQ tablet Take 1 tablet (10 mEq total) by mouth daily. 11/23/12 11/23/13  Biagio Borg, MD     Allergies  Allergen Reactions  . Penicillins     REACTION: Hives    Social History:  reports that he has been smoking Cigars.  He has never used smokeless tobacco. He reports that he drinks alcohol. He reports that he does not use illicit drugs.     Family History  Problem Relation Age of Onset  . Heart disease Mother   . Throat cancer Mother   . Diabetes Father   . Stroke Other   . Hypertension Other    . Cancer Maternal Aunt     lung, brain  . Cancer Maternal Uncle     brain aneurysm       Physical Exam:  GEN:  Pleasant Morbidly Obese  53 y.o. Caucasian male examined and in no acute distress; cooperative with exam Filed Vitals:   04/02/14 2130 04/02/14 2139 04/02/14 2145 04/02/14 2200  BP: 95/50  101/53 99/52  Pulse: 92  97 93  Temp:  101.2 F (38.4 C)    TempSrc:  Oral  Resp: 22     Height:      Weight:      SpO2: 94%  98% 99%   Blood pressure 99/52, pulse 93, temperature 101.2 F (38.4 C), temperature source Oral, resp. rate 22, height 5' 1.5" (1.562 m), weight 106.142 kg (234 lb), SpO2 99.00%. PSYCH: He is alert and oriented x4; does not appear anxious does not appear depressed; affect is normal HEENT: Normocephalic and Atraumatic, Mucous membranes pink; PERRLA; EOM intact; Fundi:  Benign;  No scleral icterus, Nares: Patent, Oropharynx: Clear, Fair Dentition,    Neck:  FROM, No Cervical Lymphadenopathy nor Thyromegaly or Carotid Bruit; No JVD; Breasts:: Not examined CHEST WALL: No tenderness CHEST: Normal respiration, clear to auscultation bilaterally HEART: Regular rate and rhythm; no murmurs rubs or gallops BACK: No kyphosis or scoliosis; No CVA tenderness ABDOMEN: Positive Bowel Sounds, Obese, Soft Non-Tender; No Masses, No Organomegaly, No Pannus; No Intertriginous candida. Rectal Exam: Not done EXTREMITIES: No Cyanosis, Clubbing, or Edema; No Ulcerations. Genitalia: not examined PULSES: 2+ and symmetric SKIN: Normal hydration no rash or ulceration CNS:   Alert and Oriented x 4, No Focal Deficits.     Vascular: pulses palpable throughout    Labs on Admission:  Basic Metabolic Panel:  Recent Labs Lab 04/02/14 1727  NA 136*  K 3.7  CL 96  CO2 27  GLUCOSE 144*  BUN 15  CREATININE 0.97  CALCIUM 8.9   Liver Function Tests:  Recent Labs Lab 04/02/14 1727  AST 23  ALT 20  ALKPHOS 42  BILITOT 1.2  PROT 6.4  ALBUMIN 3.6   No results found for  this basename: LIPASE, AMYLASE,  in the last 168 hours No results found for this basename: AMMONIA,  in the last 168 hours CBC:  Recent Labs Lab 04/02/14 1727  WBC 14.5*  NEUTROABS 13.3*  HGB 14.6  HCT 44.0  MCV 87.5  PLT 119*   Cardiac Enzymes: No results found for this basename: CKTOTAL, CKMB, CKMBINDEX, TROPONINI,  in the last 168 hours  BNP (last 3 results) No results found for this basename: PROBNP,  in the last 8760 hours CBG: No results found for this basename: GLUCAP,  in the last 168 hours  Radiological Exams on Admission: Dg Chest Port 1 View  04/02/2014   CLINICAL DATA:  Fever.  Leg pain.  Back pain.  EXAM: PORTABLE CHEST - 1 VIEW  COMPARISON:  01/17/2014.  FINDINGS: Cardiomegaly. Median sternotomy. Apical lordotic projection. Pulmonary vascular congestion. No airspace disease. No pleural effusion. Monitoring leads project over the chest.  IMPRESSION: Cardiomegaly and pulmonary vascular congestion.   Electronically Signed   By: Dereck Ligas M.D.   On: 04/02/2014 20:32     EKG: Independently reviewed. Atrial fibrillation rate 92,  +LBBB   Assessment/Plan:   53 y.o. male with  Principal Problem:   1.   Cellulitis  IV Vancomycin , Aztreonam, and Metronidazole   Active Problems:   2.   SIRS (systemic inflammatory response syndrome)/  Sepsis   Blood Cultures sent, and Placed on IV Vancomycin and Aztreonam, and Metronidazole   Adjust Abxs pending Culture Results     3.   Hypotension   IVFs,  Hold Anti-Hypertensives,  Check Cortisol level. Monitor BPs     4.   DIABETES MELLITUS, TYPE II   SSI coverage PRN and Check HbA1c.        5.   ATRIAL FIBRILLATION, CHRONIC   On Digoxin, Diltiazem, and Coumadin Rx.  6.   AORTIC VALVE REPLACEMENT, HX OF   On Coumadin Rx.        7.   CAD (coronary artery disease)   stable     8.   Venous insufficiency of both lower extremities   unchanged     9.   Morbid obesity   stable   10.   DVT  Prophylaxis   On Coumadin Rx, check PT/INR      Code Status:   FULL CODE Family Communication:    No Family Present Disposition Plan:     Inpatient Time spent:  [place] [facility] Pager [phone]   If [place] Please Contact the Day Rounding Team MD for [facility]  If 7PM-7AM, Please Contact night-coverage  [email or web address] Password [facility] 04/02/2014, 10:29 PM

## 2014-04-02 NOTE — ED Provider Notes (Addendum)
CSN: [id number]     Arrival date & time 04/02/14  1653 History   First MD Initiated Contact with Patient 04/02/14 1958     Chief Complaint  Patient presents with  . Fever  . Leg Pain  . Back Pain     (Consider location/radiation/quality/duration/timing/severity/associated sxs/prior Treatment) HPI 53 year old male with multiple health problems presents today complaining of left leg pain fever and chills. He states that he noted a fever earlier and had some pain in the left thigh. He looks at the leg and did not notice any abnormalities of the skin. He does have chronic venous stasis bilateral lower extremities. He has chronic back pain. He normally takes oxycodone and is fairly well-controlled this. He has not noted headache, chest pain, shortness of breath, nausea, vomiting, or diarrhea. He recently started metformin and thinks that the muscle pain may be secondary to this. Past Medical History  Diagnosis Date  . Diabetes mellitus   . Hyperlipidemia   . Hypertension   . Arthritis   . Leg pain 06/28/2010  . Hiatal hernia   . Atrial fibrillation   . Reflux 11/06/09  . Myocardial infarction age 30  . Varicose veins   . Obesity   . Peripheral vascular disease   . Gout   . CAD (coronary artery disease)     Old scar inferior wall myoview, 10/2009 EF 52%  . Chronic LBP 10/08/2011  . Impaired glucose tolerance 10/08/2011  . Erectile dysfunction 10/08/2011  . Testicular cancer   . Bell's palsy   . TIA (transient ischemic attack)     age 79  . CVA (cerebral infarction)     53yo  . OSA (obstructive sleep apnea)     CPAP  . History of colon polyps   . Anemia   . Hypogonadism male 04/08/2012  . Aortic aneurysm    Past Surgical History  Procedure Laterality Date  . Laser ablation  03/06/2010    left leg  . Cardiac valve replacement      aortic valve replaced mechanical age 42 after trauma, SVG to RCA at that time  . Cholecystectomy  2011  . Tonsillectomy  1967  . Otoplasaty      bilateral   . Surgery scrotal / testicular      cancer   Family History  Problem Relation Age of Onset  . Heart disease Mother   . Throat cancer Mother   . Diabetes Father   . Stroke Other   . Hypertension Other   . Cancer Maternal Aunt     lung, brain  . Cancer Maternal Uncle     brain aneurysm   History  Substance Use Topics  . Smoking status: Current Some Day Smoker    Types: Cigars  . Smokeless tobacco: Never Used     Comment: about 3 yearly  . Alcohol Use: Yes     Comment: social    Review of Systems  Constitutional: Positive for chills and fatigue.  Musculoskeletal: Positive for back pain.  All other systems reviewed and are negative.     Allergies  Penicillins  Home Medications   Prior to Admission medications   Medication Sig Start Date End Date Taking? Authorizing Provider  allopurinol (ZYLOPRIM) 300 MG tablet Take 1 tablet (300 mg total) by mouth daily. 11/17/12   Biagio Borg, MD  Ascorbic Acid (VITAMIN C) 1000 MG tablet Take 1,000 mg by mouth daily.      Historical Provider, MD  atenolol (TENORMIN) 100 MG  tablet Take 1 tablet (100 mg total) by mouth daily. Take 100 mg tablet in morning and 1/2 tablet (50mg ) in the evening. 12/19/13   Biagio Borg, MD  b complex vitamins tablet Take 1 tablet by mouth daily.      Historical Provider, MD  Calcium-Magnesium-Zinc (CAL-MAG-ZINC PO) Take by mouth daily.      Historical Provider, MD  digoxin (LANOXIN) 0.25 MG tablet Take 1 tablet (250 mcg total) by mouth daily. 11/23/12 11/23/13  Biagio Borg, MD  diltiazem (CARTIA XT) 120 MG 24 hr capsule Take 1 capsule (120 mg total) by mouth daily. 12/19/13   Biagio Borg, MD  enoxaparin (LOVENOX) 100 MG/ML injection Inject 1 mL (100 mg total) into the skin every 12 (twelve) hours. As instructed 02/02/14   [name], MD  esomeprazole (NEXIUM) 40 MG capsule Take 1 capsule (40 mg total) by mouth daily. 10/08/11   Biagio Borg, MD  Fe Fum-FePoly-Vit C-Vit B3 (INTEGRA) 62.5-62.5-40-3 MG CAPS  Take 1 capsule by mouth daily. 04/08/13   Biagio Borg, MD  furosemide (LASIX) 40 MG tablet Take 1 tablet (40 mg total) by mouth daily as needed. 10/08/11   Biagio Borg, MD  INSULIN SYRINGE .5CC/29G (B-D INS SYR ULTRAFINE .5CC/29G) 29G X 1/2" 0.5 ML MISC 1 application by Does not apply route once a week. 04/08/12 04/08/14  Biagio Borg, MD  IRON PO Take by mouth daily.    Historical Provider, MD  methocarbamol (ROBAXIN) 500 MG tablet Take 1 tablet (500 mg total) by mouth 3 (three) times daily. 10/08/11   Biagio Borg, MD  oxyCODONE-acetaminophen (PERCOCET/ROXICET) 5-325 MG per tablet Take 1 tablet by mouth every 8 (eight) hours as needed for pain. To fill Dec 24, 2012 10/27/12   Biagio Borg, MD  potassium chloride (KLOR-CON 10) 10 MEQ tablet Take 1 tablet (10 mEq total) by mouth daily. 11/23/12 11/23/13  Biagio Borg, MD  temazepam (RESTORIL) 15 MG capsule Take 15 mg by mouth at bedtime as needed for sleep.    Historical Provider, MD  vitamin E (VITAMIN E) 400 UNIT capsule Take 400 Units by mouth daily.      Historical Provider, MD  warfarin (COUMADIN) 5 MG tablet As directed by coumadin clinic. 03/23/14 03/22/16  [name], MD   BP 120/60  Pulse 97  Temp(Src) 100.3 F (37.9 C) (Oral)  Resp 20  Ht 5' 1.5" (1.562 m)  Wt 234 lb (106.142 kg)  BMI 43.50 kg/m2  SpO2 99% Physical Exam  Nursing note and vitals reviewed. Constitutional: He is oriented to person, place, and time. He appears well-developed and well-nourished.  Morbidly obese male  HENT:  Head: Normocephalic and atraumatic.  Right Ear: External ear normal.  Left Ear: External ear normal.  Nose: Nose normal.  Mouth/Throat: Oropharynx is clear and moist.  Eyes: Conjunctivae and EOM are normal. Pupils are equal, round, and reactive to light.  Neck: Normal range of motion. Neck supple.  Cardiovascular: Normal rate, regular rhythm, normal heart sounds and intact distal pulses.   Pulmonary/Chest: Effort normal and breath sounds normal. No  respiratory distress. He has no wheezes. He exhibits no tenderness.  Abdominal: Soft. Bowel sounds are normal. He exhibits no distension and no mass. There is no tenderness. There is no guarding.  Musculoskeletal: Normal range of motion.  Bilateral lower extremity chronic venous stasis changes with erythema and redness on the left medial eye spreading from knee to groin on the anterior medial aspect  Neurological: He is alert and oriented to person, place, and time. He has normal reflexes. He exhibits normal muscle tone. Coordination normal.  Skin: Skin is warm and dry.  Psychiatric: He has a normal mood and affect. His behavior is normal. Judgment and thought content normal.    ED Course  Procedures (including critical care time) Labs Review Labs Reviewed  CBC WITH DIFFERENTIAL - Abnormal; Notable for the following:    WBC 14.5 (*)    RDW 16.4 (*)    Platelets 119 (*)    Neutrophils Relative % 92 (*)    Neutro Abs 13.3 (*)    Lymphocytes Relative 4 (*)    Lymphs Abs 0.5 (*)    All other components within normal limits  COMPREHENSIVE METABOLIC PANEL - Abnormal; Notable for the following:    Sodium 136 (*)    Glucose, Bld 144 (*)    All other components within normal limits  URINALYSIS, ROUTINE W REFLEX MICROSCOPIC - Abnormal; Notable for the following:    Color, Urine AMBER (*)    Protein, ur 30 (*)    All other components within normal limits  URINE MICROSCOPIC-ADD ON - Abnormal; Notable for the following:    Squamous Epithelial / LPF FEW (*)    All other components within normal limits  CULTURE, BLOOD (ROUTINE X 2)  CULTURE, BLOOD (ROUTINE X 2)  URINE CULTURE  I-STAT CG4 LACTIC ACID, ED  I-STAT CG4 LACTIC ACID, ED    Imaging Review No results found.   EKG Interpretation   Date/Time:  Sunday April 02 2014 21:01:10 EDT Ventricular Rate:  92 PR Interval:    QRS Duration: 130 QT Interval:  386 QTC Calculation: 477 R Axis:   19 Text Interpretation:  Atrial  fibrillation Left bundle branch block No  significant change since last tracing Confirmed by Jasmin Winberry MD, Andee Poles  ([id number]) on 04/02/2014 9:26:01 PM      MDM   Final diagnoses:  Cellulitis of left lower extremity    53 year old male with multiple health problems presents today with cellulitis of the left medial thigh. He has a history of penicillin allergy and placed on azatram , metronidazole, and Flagyl. Patient febrile with elevated white blood cell count but has maintained good blood pressures and is not tachycardic. His initial lactic acid is normal in repeat is pending. Plan admission for cellulitis  Discussed with Dr. Arnoldo Morale and patient to be admitted to med surg bed.   Shaune Pollack, MD 04/02/14 2123  Shaune Pollack, MD 04/02/14 2126  9:36 PM Patient with blood pressure 94/55 after first liter of fluid. Patient is receiving an additional liter of fluid. I discussed this with Dr. Arnoldo Morale in patients but has been upgraded to step down.  Shaune Pollack, MD 04/02/14 2136

## 2014-04-02 NOTE — Progress Notes (Signed)
ANTICOAGULATION/ANTIBIOTIC CONSULT NOTE - Initial Consult  Pharmacy Consult for Coumadin and Vancocin/Azactam Indication: AVR/Afib and sepsis  Allergies  Allergen Reactions  . Penicillins     REACTION: Hives    Patient Measurements: Height: 5' 1.5" (156.2 cm) Weight: 234 lb (106.142 kg) IBW/kg (Calculated) : 53.45  Vital Signs: Temp: 101.2 F (38.4 C) (08/02 2139) Temp src: Oral (08/02 2139) BP: 99/52 mmHg (08/02 2200) Pulse Rate: 93 (08/02 2200)  Labs:  Recent Labs  04/02/14 1727  HGB 14.6  HCT 44.0  PLT 119*  LABPROT 37.7*  INR 3.83*  CREATININE 0.97    Estimated Creatinine Clearance: 93.9 ml/min (by C-G formula based on Cr of 0.97).   Medical History: Past Medical History  Diagnosis Date  . Diabetes mellitus   . Hyperlipidemia   . Hypertension   . Arthritis   . Leg pain 06/28/2010  . Hiatal hernia   . Atrial fibrillation   . Reflux 11/06/09  . Myocardial infarction age 53  . Varicose veins   . Obesity   . Peripheral vascular disease   . Gout   . CAD (coronary artery disease)     Old scar inferior wall myoview, 10/2009 EF 52%  . Chronic LBP 10/08/2011  . Impaired glucose tolerance 10/08/2011  . Erectile dysfunction 10/08/2011  . Testicular cancer   . Bell's palsy   . TIA (transient ischemic attack)     age 69  . CVA (cerebral infarction)     53yo  . OSA (obstructive sleep apnea)     CPAP  . History of colon polyps   . Anemia   . Hypogonadism male 04/08/2012  . Aortic aneurysm     Assessment: 53yo male c/o fever/chills and leg/back pain, no recent injury, only recent medical change was metformin begun 3wk ago, now hypotensive, concern for sepsis w/ cellulitis source, to begin IV ABX; also to continue Coumadin for Afib/AVR with current INR above goal, took today's Coumadin dose PTA.  Goal of Therapy:  INR 2.5-3.5 Vancomycin trough 15-20 (until sepsis r/o)   Plan:  Rec'd vanc 1g and aztreonam 2g IV in ED; will continue with vancomycin 1000mg  IV  Q12H and aztreonam 1g IV Q8H and monitor CBC, Cx, levels prn.  Will monitor INR to resume Coumadin.  Wynona Neat, PharmD, BCPS  04/02/2014,10:31 PM

## 2014-04-02 NOTE — ED Notes (Signed)
I Stat Lactic Acid results shown to Dr. D. Ray 

## 2014-04-02 NOTE — ED Notes (Signed)
Spoke with Dr. Jeanell Sparrow about reducing initial fluid bolus of 3ml per kg due to pts hx of fluid build up on his legs. Dr Jeanell Sparrow to put in new order for pt.

## 2014-04-02 NOTE — ED Notes (Signed)
Pt c/o fever at home, leg pain and back pain. Pt began taking metformin 3 weeks ago. Pt denies recent injury.

## 2014-04-03 DIAGNOSIS — I2510 Atherosclerotic heart disease of native coronary artery without angina pectoris: Secondary | ICD-10-CM

## 2014-04-03 DIAGNOSIS — I2584 Coronary atherosclerosis due to calcified coronary lesion: Secondary | ICD-10-CM

## 2014-04-03 LAB — GLUCOSE, CAPILLARY
GLUCOSE-CAPILLARY: 130 mg/dL — AB (ref 70–99)
GLUCOSE-CAPILLARY: 95 mg/dL (ref 70–99)
GLUCOSE-CAPILLARY: 171 mg/dL — AB (ref 70–99)
GLUCOSE-CAPILLARY: 92 mg/dL (ref 70–99)
Glucose-Capillary: 95 mg/dL (ref 70–99)

## 2014-04-03 LAB — BASIC METABOLIC PANEL
Anion gap: 12 (ref 5–15)
BUN: 11 mg/dL (ref 6–23)
CALCIUM: 8 mg/dL — AB (ref 8.4–10.5)
CO2: 25 mEq/L (ref 19–32)
Chloride: 102 mEq/L (ref 96–112)
Creatinine, Ser: 0.7 mg/dL (ref 0.50–1.35)
Glucose, Bld: 97 mg/dL (ref 70–99)
POTASSIUM: 3 meq/L — AB (ref 3.7–5.3)
Sodium: 139 mEq/L (ref 137–147)

## 2014-04-03 LAB — PROTIME-INR
INR: 3.42 — ABNORMAL HIGH (ref 0.00–1.49)
PROTHROMBIN TIME: 34.5 s — AB (ref 11.6–15.2)

## 2014-04-03 LAB — CBC
HCT: 39.1 % (ref 39.0–52.0)
Hemoglobin: 12.6 g/dL — ABNORMAL LOW (ref 13.0–17.0)
MCH: 28.1 pg (ref 26.0–34.0)
MCHC: 32.2 g/dL (ref 30.0–36.0)
MCV: 87.1 fL (ref 78.0–100.0)
PLATELETS: 95 10*3/uL — AB (ref 150–400)
RBC: 4.49 MIL/uL (ref 4.22–5.81)
RDW: 16.6 % — AB (ref 11.5–15.5)
WBC: 14.7 10*3/uL — ABNORMAL HIGH (ref 4.0–10.5)

## 2014-04-03 LAB — MRSA PCR SCREENING: MRSA BY PCR: NEGATIVE

## 2014-04-03 LAB — CORTISOL: CORTISOL PLASMA: 10.6 ug/dL

## 2014-04-03 LAB — HEMOGLOBIN A1C
HEMOGLOBIN A1C: 6.1 % — AB (ref <5.7)
Mean Plasma Glucose: 128 mg/dL — ABNORMAL HIGH (ref <117)

## 2014-04-03 MED ORDER — WARFARIN SODIUM 5 MG PO TABS
5.0000 mg | ORAL_TABLET | Freq: Once | ORAL | Status: AC
  Administered 2014-04-03: 5 mg via ORAL
  Filled 2014-04-03: qty 1

## 2014-04-03 MED ORDER — POTASSIUM CHLORIDE ER 10 MEQ PO TBCR
40.0000 meq | EXTENDED_RELEASE_TABLET | Freq: Every day | ORAL | Status: DC
  Administered 2014-04-03 – 2014-04-05 (×3): 40 meq via ORAL
  Filled 2014-04-03 (×3): qty 4

## 2014-04-03 MED ORDER — METHOCARBAMOL 500 MG PO TABS
500.0000 mg | ORAL_TABLET | Freq: Three times a day (TID) | ORAL | Status: DC | PRN
  Administered 2014-04-03 – 2014-04-05 (×3): 500 mg via ORAL
  Filled 2014-04-03 (×3): qty 1

## 2014-04-03 MED ORDER — METOPROLOL TARTRATE 1 MG/ML IV SOLN
INTRAVENOUS | Status: AC
  Administered 2014-04-03: 1 mg
  Filled 2014-04-03: qty 5

## 2014-04-03 NOTE — Progress Notes (Signed)
Utilization review completed.  

## 2014-04-03 NOTE — Progress Notes (Signed)
ANTICOAGULATION CONSULT NOTE - Initial Consult  Pharmacy Consult for warfarin Indication: Atrial Fibrillation and Mechanical AVR  Allergies  Allergen Reactions  . Penicillins     REACTION: Hives    Patient Measurements: Height: 5\' 1"  (154.9 cm) Weight: 233 lb 14.5 oz (106.1 kg) IBW/kg (Calculated) : 52.3 Heparin Dosing Weight:   Vital Signs: Temp: 99 F (37.2 C) (08/03 0700) Temp src: Oral (08/03 0700) BP: 114/63 mmHg (08/03 0731) Pulse Rate: 85 (08/03 0731)  Labs:  Recent Labs  04/02/14 1727 04/03/14 0240  HGB 14.6 12.6*  HCT 44.0 39.1  PLT 119* 95*  LABPROT 37.7* 34.5*  INR 3.83* 3.42*  CREATININE 0.97 0.70     Medical History: Past Medical History  Diagnosis Date  . Diabetes mellitus   . Hyperlipidemia   . Hypertension   . Arthritis   . Leg pain 06/28/2010  . Hiatal hernia   . Atrial fibrillation   . Reflux 11/06/09  . Myocardial infarction age 25  . Varicose veins   . Obesity   . Peripheral vascular disease   . Gout   . CAD (coronary artery disease)     Old scar inferior wall myoview, 10/2009 EF 52%  . Chronic LBP 10/08/2011  . Impaired glucose tolerance 10/08/2011  . Erectile dysfunction 10/08/2011  . Testicular cancer   . Bell's palsy   . TIA (transient ischemic attack)     age 34  . CVA (cerebral infarction)     53yo  . OSA (obstructive sleep apnea)     CPAP  . History of colon polyps   . Anemia   . Hypogonadism male 04/08/2012  . Aortic aneurysm     Medications:  Prescriptions prior to admission  Medication Sig Dispense Refill  . allopurinol (ZYLOPRIM) 300 MG tablet Take 300 mg by mouth daily.      . Ascorbic Acid (VITAMIN C) 1000 MG tablet Take 1,000 mg by mouth daily.        Marland Kitchen atenolol (TENORMIN) 100 MG tablet Take 100 mg by mouth daily. Take 100 mg tablet in morning and 1/2 tablet (50mg ) in the evening.      Marland Kitchen b complex vitamins tablet Take 1 tablet by mouth daily.        . Calcium-Magnesium-Zinc (CAL-MAG-ZINC PO) Take by mouth daily.         . digoxin (LANOXIN) 0.25 MG tablet Take 0.25 mg by mouth daily.      Marland Kitchen diltiazem (DILACOR XR) 120 MG 24 hr capsule Take 120 mg by mouth daily.      Marland Kitchen esomeprazole (NEXIUM) 40 MG capsule Take 40 mg by mouth daily at 12 noon.      . furosemide (LASIX) 40 MG tablet Take 20-40 mg by mouth daily as needed for fluid.      . methocarbamol (ROBAXIN) 500 MG tablet Take 500 mg by mouth daily as needed for muscle spasms.      Marland Kitchen oxyCODONE-acetaminophen (PERCOCET/ROXICET) 5-325 MG per tablet Take 1 tablet by mouth every 8 (eight) hours as needed for pain. To fill Dec 24, 2012      . temazepam (RESTORIL) 15 MG capsule Take 15 mg by mouth at bedtime as needed for sleep.      . vitamin E (VITAMIN E) 400 UNIT capsule Take 400 Units by mouth daily.        Marland Kitchen warfarin (COUMADIN) 5 MG tablet Take 5 mg by mouth See admin instructions. Take on Tuesdays, Thursdays and saturdays.Marland Kitchen      Marland Kitchen  warfarin (COUMADIN) 7.5 MG tablet Take 7.5 mg by mouth See admin instructions. Take on Mondays, Wednesdays, Fridays, and Sundays      . digoxin (LANOXIN) 0.25 MG tablet Take 1 tablet (250 mcg total) by mouth daily.  90 tablet  3  . INSULIN SYRINGE .5CC/29G (B-D INS SYR ULTRAFINE .5CC/29G) 29G X 1/2" 0.5 ML MISC 1 application by Does not apply route once a week.  100 each  0  . potassium chloride (KLOR-CON 10) 10 MEQ tablet Take 1 tablet (10 mEq total) by mouth daily.  90 tablet  3    Assessment: 53 yo male presents with redness, pain of L thigh, fevers, chills.  On warfarin for chronic AFib and mechanical AVR. INR supratherapeutic on admission, now therapeutic at 3.4 (goal 2.5-3.5).  On warfarin PTA: 5 mg po on Tues/Thurs/Sat and 7.5 mg on all other days    Goal of Therapy:  INR 2.5-3.5 Monitor platelets by anticoagulation protocol: Yes    Plan:  Warfarin 5 mg po x1 Daily INR Monitor for s/sx bleeding   Hughes Better, PharmD, BCPS Clinical Pharmacist Pager: [phone] 04/03/2014 10:49 AM

## 2014-04-03 NOTE — Progress Notes (Signed)
[facility] PROGRESS NOTE  Lance Cook JJH:[id number] DOB: 1961-08-08 DOA: 04/02/2014 PCP: No PCP Per Patient  Assessment/Plan: 1. L thigh cellulitis/SIRS -Continue Vancomycin/Aztreonam -FU blood Cx -improving  2. DM -hold metformin, SSI  3. Chronic Afib -continue cardizem, digoxin, coumadin  4. H/o AVR -continue Coumadin  5.  Chronic venous stasis with lymphedema  -hold lasix   DVt proph: on coumadin  Code Status: Full Code Family Communication: none at bedside Disposition Plan: home pending improvement   Antibiotics:  vanc/Aztreonam  HPI/Subjective: Feels better, doing better, redness/pain improving  Objective: Filed Vitals:   04/03/14 0731  BP: 114/63  Pulse: 85  Temp:   Resp: 21    Intake/Output Summary (Last 24 hours) at 04/03/14 1127 Last data filed at 04/03/14 0800  Gross per 24 hour  Intake    900 ml  Output   1200 ml  Net   -300 ml   Filed Weights   04/02/14 1723 04/02/14 2245  Weight: 106.142 kg (234 lb) 106.1 kg (233 lb 14.5 oz)    Exam:   General:  AAOx3  Cardiovascular: S1S2/RRR  Respiratory: CTAB  Abdomen: soft, Nt, BS present Musculoskeletal: L thigh with improving redness, warmth and tenderness  Data Reviewed: Basic Metabolic Panel:  Recent Labs Lab 04/02/14 1727 04/03/14 0240  NA 136* 139  K 3.7 3.0*  CL 96 102  CO2 27 25  GLUCOSE 144* 97  BUN 15 11  CREATININE 0.97 0.70  CALCIUM 8.9 8.0*   Liver Function Tests:  Recent Labs Lab 04/02/14 1727  AST 23  ALT 20  ALKPHOS 42  BILITOT 1.2  PROT 6.4  ALBUMIN 3.6   No results found for this basename: LIPASE, AMYLASE,  in the last 168 hours No results found for this basename: AMMONIA,  in the last 168 hours CBC:  Recent Labs Lab 04/02/14 1727 04/03/14 0240  WBC 14.5* 14.7*  NEUTROABS 13.3*  --   HGB 14.6 12.6*  HCT 44.0 39.1  MCV 87.5 87.1  PLT 119* 95*   Cardiac Enzymes: No results found for this basename: CKTOTAL, CKMB, CKMBINDEX, TROPONINI,   in the last 168 hours BNP (last 3 results) No results found for this basename: PROBNP,  in the last 8760 hours CBG:  Recent Labs Lab 04/02/14 2259 04/03/14 0732  GLUCAP 130* 95    Recent Results (from the past 240 hour(s))  MRSA PCR SCREENING     Status: None   Collection Time    04/03/14  1:31 AM      Result Value Ref Range Status   MRSA by PCR NEGATIVE  NEGATIVE Final   Comment:            The GeneXpert MRSA Assay (FDA     approved for NASAL specimens     only), is one component of a     comprehensive MRSA colonization     surveillance program. It is not     intended to diagnose MRSA     infection nor to guide or     monitor treatment for     MRSA infections.     Studies: Dg Chest Port 1 View  04/02/2014   CLINICAL DATA:  Fever.  Leg pain.  Back pain.  EXAM: PORTABLE CHEST - 1 VIEW  COMPARISON:  01/17/2014.  FINDINGS: Cardiomegaly. Median sternotomy. Apical lordotic projection. Pulmonary vascular congestion. No airspace disease. No pleural effusion. Monitoring leads project over the chest.  IMPRESSION: Cardiomegaly and pulmonary vascular congestion.   Electronically Signed  By: Dereck Ligas M.D.   On: 04/02/2014 20:32    Scheduled Meds: . allopurinol  300 mg Oral Daily  . aztreonam  1 g Intravenous Q8H  . digoxin  0.25 mg Oral Daily  . insulin aspart  0-5 Units Subcutaneous QHS  . insulin aspart  0-9 Units Subcutaneous TID WC  . pantoprazole  40 mg Oral Daily  . potassium chloride  40 mEq Oral Daily  . vancomycin  1,000 mg Intravenous Q12H  . warfarin  5 mg Oral ONCE-1800  . Warfarin - Pharmacist Dosing Inpatient   Does not apply q1800   Continuous Infusions:  Antibiotics Given (last 72 hours)   Date/Time Action Medication Dose Rate   04/02/14 2300 Given   aztreonam (AZACTAM) 2 g in dextrose 5 % 50 mL IVPB 2 g 100 mL/hr   04/03/14 0438 Given   metroNIDAZOLE (FLAGYL) IVPB 500 mg 500 mg 100 mL/hr   04/03/14 0800 Given   aztreonam (AZACTAM) 1 g in dextrose 5  % 50 mL IVPB 1 g 100 mL/hr   04/03/14 0933 Given   vancomycin (VANCOCIN) IVPB 1000 mg/200 mL premix 1,000 mg 200 mL/hr      Principal Problem:   Cellulitis Active Problems:   DIABETES MELLITUS, TYPE II   ATRIAL FIBRILLATION, CHRONIC   AORTIC VALVE REPLACEMENT, HX OF   CAD (coronary artery disease)   SIRS (systemic inflammatory response syndrome)   Sepsis   Hypotension   Venous insufficiency of both lower extremities   Morbid obesity    Time spent: 41min    [facility] Pager [phone]. If 7PM-7AM, please contact night-coverage at [email or web address], password [facility] 04/03/2014, 11:27 AM  LOS: 1 day

## 2014-04-03 NOTE — Progress Notes (Signed)
Nursing Pt requesting Coumadin, states he missed his dose yesterday.  Spoke with pharmacist, INR 3.8, will readdress after am INR.  Pt informed and verbalized understanding.

## 2014-04-04 DIAGNOSIS — I87319 Chronic venous hypertension (idiopathic) with ulcer of unspecified lower extremity: Secondary | ICD-10-CM

## 2014-04-04 DIAGNOSIS — L97909 Non-pressure chronic ulcer of unspecified part of unspecified lower leg with unspecified severity: Secondary | ICD-10-CM

## 2014-04-04 LAB — URINE CULTURE: Colony Count: 40000

## 2014-04-04 LAB — GLUCOSE, CAPILLARY
GLUCOSE-CAPILLARY: 108 mg/dL — AB (ref 70–99)
GLUCOSE-CAPILLARY: 123 mg/dL — AB (ref 70–99)
GLUCOSE-CAPILLARY: 132 mg/dL — AB (ref 70–99)
Glucose-Capillary: 145 mg/dL — ABNORMAL HIGH (ref 70–99)

## 2014-04-04 LAB — PROTIME-INR
INR: 1.99 — ABNORMAL HIGH (ref 0.00–1.49)
Prothrombin Time: 22.6 seconds — ABNORMAL HIGH (ref 11.6–15.2)

## 2014-04-04 MED ORDER — WARFARIN SODIUM 7.5 MG PO TABS
7.5000 mg | ORAL_TABLET | Freq: Once | ORAL | Status: AC
  Administered 2014-04-04: 7.5 mg via ORAL
  Filled 2014-04-04: qty 1

## 2014-04-04 MED ORDER — DILTIAZEM HCL ER COATED BEADS 120 MG PO CP24
120.0000 mg | ORAL_CAPSULE | Freq: Every day | ORAL | Status: DC
  Administered 2014-04-04 – 2014-04-05 (×2): 120 mg via ORAL
  Filled 2014-04-04 (×2): qty 1

## 2014-04-04 MED ORDER — LEVOFLOXACIN 500 MG PO TABS: 500.0000 mg | ORAL_TABLET | Freq: Every day | ORAL | Status: DC

## 2014-04-04 MED ORDER — DOXYCYCLINE HYCLATE 100 MG PO TABS: 100.0000 mg | ORAL_TABLET | Freq: Two times a day (BID) | ORAL | Status: DC

## 2014-04-04 NOTE — Discharge Summary (Signed)
Physician Discharge Summary  Lance Cook [id number]:[id number] DOB: 1961/06/27 DOA: 04/02/2014  PCP: No PCP Per Patient  Admit date: 04/02/2014 Discharge date: 04/04/2014  Time spent: 35 minutes  Recommendations for Outpatient Follow-up:  1. PCP in am as scheduled and have PT/INR checked 2. Dr [name], in a week, call for appt upon discharge  Discharge Diagnoses:  Principal Problem:   Cellulitis Active Problems:   DIABETES MELLITUS, TYPE II   ATRIAL FIBRILLATION, CHRONIC   AORTIC VALVE REPLACEMENT, HX OF   CAD (coronary artery disease)   SIRS (systemic inflammatory response syndrome)   Sepsis   Hypotension   Venous insufficiency of both lower extremities   Morbid obesity   Discharge Condition: stable  Diet recommendation: diabetic, low sodium  Filed Weights   04/02/14 1723 04/02/14 2245  Weight: 106.142 kg (234 lb) 106.1 kg (233 lb 14.5 oz)    History of present illness:  Chief Complaint: Redness and Pain of left Thigh and fevers and Chills  HPI: Lance Cook is a 53 y.o. male with a history of Chronic Atrial Fibrillation, CAD, S/P Mechanical AVR on coumadin Rx, DM2, PVD who presents to the ED with complaints of redness and pain of the left thigh since the afternoon. He has complaints of fevers and chills. He was evaluated in the ED and found to have a Cellulitis of the left thigh and was placed on IV Vancomycin Aztreonam and Metronidazole . He began to have hypotension in the ED, and was administered IVFs  Hospital Course:  1. L thigh cellulitis/Sepsis -treated with IV Vancomycin/Aztreonam  -pt was improving clinically and was , transitioned to Doxycycline and Levaquin at discharge -Blood Cx negative have remained, and leukocytosis has resolved -FU with PCP in 1 week 2. DM  -held metforminin the hospital, resume this upon discharge  3. Chronic Afib  -continue digoxin, coumadin  -HR in 110-115 range, resumed Cardizem on 8/4 and Hr was 114 this am and BP stable so atenelol was  resumed as well and follow up HR IN THE 70s. He wants to be discharged>> he is to follow up with Dr [name] - IContinue coumadin as discussed below    4. H/o AVR  -continue Coumadin per pharmacy -INR is 2.12 Today, continue coumadin and follow up for PT/INR upon discharge as directed and further coumadin dose adjustment.  5. Chronic venous stasis with lymphedema  -held lasix due to hypotension, sepsis -resume at discharge       Discharge Exam: Filed Vitals:   04/04/14 1917  BP: 134/78  Pulse: 89  Temp: 98.7 F (37.1 C)  Resp: 32    General: AAOx3 Cardiovascular: irregular rate and rhythm Respiratory: CTAB  Discharge Instructions You were cared for by a hospitalist during your hospital stay. If you have any questions about your discharge medications or the care you received while you were in the hospital after you are discharged, you can call the unit and asked to speak with the hospitalist on call if the hospitalist that took care of you is not available. Once you are discharged, your primary care physician will handle any further medical issues. Please note that NO REFILLS for any discharge medications will be authorized once you are discharged, as it is imperative that you return to your primary care physician (or establish a relationship with a primary care physician if you do not have one) for your aftercare needs so that they can reassess your need for medications and monitor your lab values.     Medication  List         allopurinol 300 MG tablet  Commonly known as:  ZYLOPRIM  Take 300 mg by mouth daily.     atenolol 100 MG tablet  Commonly known as:  TENORMIN  Take 100 mg by mouth daily. Take 100 mg tablet in morning and 1/2 tablet (50mg ) in the evening.     b complex vitamins tablet  Take 1 tablet by mouth daily.     CAL-MAG-ZINC PO  Take by mouth daily.     digoxin 0.25 MG tablet  Commonly known as:  LANOXIN  Take 0.25 mg by mouth daily.     diltiazem  120 MG 24 hr capsule  Commonly known as:  DILACOR XR  Take 120 mg by mouth daily.     doxycycline 100 MG tablet  Commonly known as:  VIBRA-TABS  Take 1 tablet (100 mg total) by mouth 2 (two) times daily. For 7 days     esomeprazole 40 MG capsule  Commonly known as:  NEXIUM  Take 40 mg by mouth daily at 12 noon.     furosemide 40 MG tablet  Commonly known as:  LASIX  Take 20-40 mg by mouth daily as needed for fluid.     INSULIN SYRINGE .5CC/29G 29G X 1/2" 0.5 ML Misc  Commonly known as:  B-D INS SYR ULTRAFINE .6UY/40H  1 application by Does not apply route once a week.     levofloxacin 500 MG tablet  Commonly known as:  LEVAQUIN  Take 1 tablet (500 mg total) by mouth daily. For 7 days     methocarbamol 500 MG tablet  Commonly known as:  ROBAXIN  Take 500 mg by mouth daily as needed for muscle spasms.     oxyCODONE-acetaminophen 5-325 MG per tablet  Commonly known as:  PERCOCET/ROXICET  Take 1 tablet by mouth every 8 (eight) hours as needed for pain. To fill Dec 24, 2012     potassium chloride 10 MEQ tablet  Commonly known as:  KLOR-CON 10  Take 1 tablet (10 mEq total) by mouth daily.     temazepam 15 MG capsule  Commonly known as:  RESTORIL  Take 15 mg by mouth at bedtime as needed for sleep.     vitamin C 1000 MG tablet  Take 1,000 mg by mouth daily.     vitamin E 400 UNIT capsule  Generic drug:  vitamin E  Take 400 Units by mouth daily.     warfarin 7.5 MG tablet  Commonly known as:  COUMADIN  Take 7.5 mg by mouth See admin instructions. Take on Mondays, Wednesdays, Fridays, and Sundays     warfarin 5 MG tablet  Commonly known as:  COUMADIN  Take 5 mg by mouth See admin instructions. Take on Tuesdays, Thursdays and saturdays..       Allergies  Allergen Reactions  . Penicillins     REACTION: Hives       Follow-up Information   Follow up with No PCP Per Patient.   Specialty:  General Practice       The results of significant diagnostics from this  hospitalization (including imaging, microbiology, ancillary and laboratory) are listed below for reference.    Significant Diagnostic Studies: Dg Chest Port 1 View  04/02/2014   CLINICAL DATA:  Fever.  Leg pain.  Back pain.  EXAM: PORTABLE CHEST - 1 VIEW  COMPARISON:  01/17/2014.  FINDINGS: Cardiomegaly. Median sternotomy. Apical lordotic projection. Pulmonary vascular congestion. No airspace disease. No  pleural effusion. Monitoring leads project over the chest.  IMPRESSION: Cardiomegaly and pulmonary vascular congestion.   Electronically Signed   By: Dereck Ligas M.D.   On: 04/02/2014 20:32    Microbiology: Recent Results (from the past 240 hour(s))  URINE CULTURE     Status: None   Collection Time    04/02/14  5:44 PM      Result Value Ref Range Status   Specimen Description URINE, CLEAN CATCH   Final   Special Requests ADDED 2109   Final   Culture  Setup Time     Final   Value: 04/02/2014 21:15     Performed at [facility]     Final   Value: 40,000 COLONIES/ML     Performed at [facility]   Culture     Final   Value: Multiple bacterial morphotypes present, none predominant. Suggest appropriate recollection if clinically indicated.     Performed at [facility]   Report Status 04/04/2014 FINAL   Final  CULTURE, BLOOD (ROUTINE X 2)     Status: None   Collection Time    04/02/14  8:36 PM      Result Value Ref Range Status   Specimen Description BLOOD RIGHT ANTECUBITAL   Final   Special Requests BOTTLES DRAWN AEROBIC AND ANAEROBIC 5CC EA   Final   Culture  Setup Time     Final   Value: 04/03/2014 00:54     Performed at [facility]   Culture     Final   Value:        BLOOD CULTURE RECEIVED NO GROWTH TO DATE CULTURE WILL BE HELD FOR 5 DAYS BEFORE ISSUING A FINAL NEGATIVE REPORT     Performed at [facility]   Report Status PENDING   Incomplete  CULTURE, BLOOD (ROUTINE X 2)     Status: None   Collection Time    04/02/14   8:44 PM      Result Value Ref Range Status   Specimen Description BLOOD LEFT ANTECUBITAL   Final   Special Requests BOTTLES DRAWN AEROBIC ONLY 3CC   Final   Culture  Setup Time     Final   Value: 04/03/2014 00:54     Performed at [facility]   Culture     Final   Value:        BLOOD CULTURE RECEIVED NO GROWTH TO DATE CULTURE WILL BE HELD FOR 5 DAYS BEFORE ISSUING A FINAL NEGATIVE REPORT     Performed at [facility]   Report Status PENDING   Incomplete  MRSA PCR SCREENING     Status: None   Collection Time    04/03/14  1:31 AM      Result Value Ref Range Status   MRSA by PCR NEGATIVE  NEGATIVE Final   Comment:            The GeneXpert MRSA Assay (FDA     approved for NASAL specimens     only), is one component of a     comprehensive MRSA colonization     surveillance program. It is not     intended to diagnose MRSA     infection nor to guide or     monitor treatment for     MRSA infections.     Labs: Basic Metabolic Panel:  Recent Labs Lab 04/02/14 1727 04/03/14 0240  NA 136* 139  K 3.7 3.0*  CL 96 102  CO2 27 25  GLUCOSE 144* 97  BUN 15 11  CREATININE 0.97 0.70  CALCIUM 8.9 8.0*   Liver Function Tests:  Recent Labs Lab 04/02/14 1727  AST 23  ALT 20  ALKPHOS 42  BILITOT 1.2  PROT 6.4  ALBUMIN 3.6   No results found for this basename: LIPASE, AMYLASE,  in the last 168 hours No results found for this basename: AMMONIA,  in the last 168 hours CBC:  Recent Labs Lab 04/02/14 1727 04/03/14 0240  WBC 14.5* 14.7*  NEUTROABS 13.3*  --   HGB 14.6 12.6*  HCT 44.0 39.1  MCV 87.5 87.1  PLT 119* 95*   Cardiac Enzymes: No results found for this basename: CKTOTAL, CKMB, CKMBINDEX, TROPONINI,  in the last 168 hours BNP: BNP (last 3 results) No results found for this basename: PROBNP,  in the last 8760 hours CBG:  Recent Labs Lab 04/03/14 1617 04/03/14 2151 04/04/14 0815 04/04/14 1200 04/04/14 1645  GLUCAP 95 92 108* 123* 145*        Signed:  JOSEPH,PREETHA  [facility] 04/04/2014, 10:18 PM

## 2014-04-04 NOTE — Progress Notes (Signed)
Order placed for cpap h/s as per home regimen, spoke with pt., will notify if wanting one set up this evening, refused @ this time, RN made aware, plan to pass on in report, RT to monitor.

## 2014-04-04 NOTE — Progress Notes (Signed)
ANTICOAGULATION CONSULT NOTE - Initial Consult  Pharmacy Consult for warfarin Indication: Atrial Fibrillation and Mechanical AVR  Allergies  Allergen Reactions  . Penicillins     REACTION: Hives    Patient Measurements: Height: 5\' 1"  (154.9 cm) Weight: 233 lb 14.5 oz (106.1 kg) IBW/kg (Calculated) : 52.3   Vital Signs: Temp: 99.8 F (37.7 C) (08/04 0402) Temp src: Oral (08/04 0402) BP: 142/90 mmHg (08/04 0824) Pulse Rate: 105 (08/04 0824)  Labs:  Recent Labs  04/02/14 1727 04/03/14 0240 04/04/14 0330  HGB 14.6 12.6*  --   HCT 44.0 39.1  --   PLT 119* 95*  --   LABPROT 37.7* 34.5* 22.6*  INR 3.83* 3.42* 1.99*  CREATININE 0.97 0.70  --      Medical History: Past Medical History  Diagnosis Date  . Diabetes mellitus   . Hyperlipidemia   . Hypertension   . Arthritis   . Leg pain 06/28/2010  . Hiatal hernia   . Atrial fibrillation   . Reflux 11/06/09  . Myocardial infarction age 74  . Varicose veins   . Obesity   . Peripheral vascular disease   . Gout   . CAD (coronary artery disease)     Old scar inferior wall myoview, 10/2009 EF 52%  . Chronic LBP 10/08/2011  . Impaired glucose tolerance 10/08/2011  . Erectile dysfunction 10/08/2011  . Testicular cancer   . Bell's palsy   . TIA (transient ischemic attack)     age 10  . CVA (cerebral infarction)     53yo  . OSA (obstructive sleep apnea)     CPAP  . History of colon polyps   . Anemia   . Hypogonadism male 04/08/2012  . Aortic aneurysm     Medications:  Prescriptions prior to admission  Medication Sig Dispense Refill  . allopurinol (ZYLOPRIM) 300 MG tablet Take 300 mg by mouth daily.      . Ascorbic Acid (VITAMIN C) 1000 MG tablet Take 1,000 mg by mouth daily.        Marland Kitchen atenolol (TENORMIN) 100 MG tablet Take 100 mg by mouth daily. Take 100 mg tablet in morning and 1/2 tablet (50mg ) in the evening.      Marland Kitchen b complex vitamins tablet Take 1 tablet by mouth daily.        . Calcium-Magnesium-Zinc (CAL-MAG-ZINC  PO) Take by mouth daily.        . digoxin (LANOXIN) 0.25 MG tablet Take 0.25 mg by mouth daily.      Marland Kitchen diltiazem (DILACOR XR) 120 MG 24 hr capsule Take 120 mg by mouth daily.      Marland Kitchen esomeprazole (NEXIUM) 40 MG capsule Take 40 mg by mouth daily at 12 noon.      . furosemide (LASIX) 40 MG tablet Take 20-40 mg by mouth daily as needed for fluid.      . methocarbamol (ROBAXIN) 500 MG tablet Take 500 mg by mouth daily as needed for muscle spasms.      Marland Kitchen oxyCODONE-acetaminophen (PERCOCET/ROXICET) 5-325 MG per tablet Take 1 tablet by mouth every 8 (eight) hours as needed for pain. To fill Dec 24, 2012      . temazepam (RESTORIL) 15 MG capsule Take 15 mg by mouth at bedtime as needed for sleep.      . vitamin E (VITAMIN E) 400 UNIT capsule Take 400 Units by mouth daily.        Marland Kitchen warfarin (COUMADIN) 5 MG tablet Take 5 mg by mouth  See admin instructions. Take on Tuesdays, Thursdays and saturdays..      . warfarin (COUMADIN) 7.5 MG tablet Take 7.5 mg by mouth See admin instructions. Take on Mondays, Wednesdays, Fridays, and Sundays      . digoxin (LANOXIN) 0.25 MG tablet Take 1 tablet (250 mcg total) by mouth daily.  90 tablet  3  . INSULIN SYRINGE .5CC/29G (B-D INS SYR ULTRAFINE .5CC/29G) 29G X 1/2" 0.5 ML MISC 1 application by Does not apply route once a week.  100 each  0  . potassium chloride (KLOR-CON 10) 10 MEQ tablet Take 1 tablet (10 mEq total) by mouth daily.  90 tablet  3    Assessment: 53 yo male presents with redness, pain of L thigh, fevers, chills.  On warfarin for chronic AFib and mechanical AVR. INR supratherapeutic on admission, now sub-therapeutic at 1.99.  On warfarin PTA: 5 mg po on Tues/Thurs/Sat and 7.5 mg on all other days    Goal of Therapy:  INR 2.5-3.5 Monitor platelets by anticoagulation protocol: Yes    Plan:  Warfarin 7.5 mg po x1 Daily INR Monitor for s/sx bleeding Consider bridging d/t mechanical AVR  Of note, pt is on digoxin and is hypokalemic, consider  replacing K to prevent digoxin toxicity.   Hughes Better, PharmD, BCPS Clinical Pharmacist Pager: [phone] 04/04/2014 8:27 AM

## 2014-04-04 NOTE — Progress Notes (Addendum)
[facility] PROGRESS NOTE  Lance Cook OIZ:[id number] DOB: April 07, 1961 DOA: 04/02/2014 PCP: No PCP Per Patient  Lance Cook is a 53 y.o. 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 Clinically improving on Abx Home tomorrow if stable, HR better  Assessment/Plan: 1. L thigh cellulitis/Sepsis -Continue Vancomycin/Aztreonam -improving well -Blood Cx negative thus far -improving  2. DM -hold metformin, SSI  3. Chronic Afib -continue digoxin, coumadin -HR in 110-115 range, resume Cardizem today  4. H/o AVR -continue Coumadin per pharmacy -INR pending this am  5.  Chronic venous stasis with lymphedema  -hold lasix -resume at discharge   DVt proph: on coumadin  Code Status: Full Code Family Communication: none at bedside Disposition Plan: Tx to tele, home tomorrow   Antibiotics:  vanc/Aztreonam  HPI/Subjective: Feels better, doing better, redness/pain improving, wants to go home  Objective: Filed Vitals:   04/04/14 1200  BP: 138/80  Pulse: 114  Temp: 98 F (36.7 C)  Resp: 20    Intake/Output Summary (Last 24 hours) at 04/04/14 1544 Last data filed at 04/04/14 0750  Gross per 24 hour  Intake    790 ml  Output   2450 ml  Net  -1660 ml   Filed Weights   04/02/14 1723 04/02/14 2245  Weight: 106.142 kg (234 lb) 106.1 kg (233 lb 14.5 oz)    Exam:   General:  AAOx3  Cardiovascular: S1S2/RRR  Respiratory: CTAB  Abdomen: soft, Nt, BS present Musculoskeletal: L thigh with improving redness, warmth and tenderness Chronic venous stasis changes in both lower legs  Data Reviewed: Basic Metabolic Panel:  Recent Labs Lab 04/02/14 1727 04/03/14 0240  NA 136* 139  K 3.7 3.0*  CL 96 102  CO2 27 25  GLUCOSE 144* 97  BUN 15 11  CREATININE 0.97 0.70  CALCIUM 8.9 8.0*    Liver Function Tests:  Recent Labs Lab 04/02/14 1727  AST 23  ALT 20  ALKPHOS 42  BILITOT 1.2  PROT 6.4  ALBUMIN 3.6   No results found for this basename: LIPASE, AMYLASE,  in the last 168 hours No results found for this basename: AMMONIA,  in the last 168 hours CBC:  Recent Labs Lab 04/02/14 1727 04/03/14 0240  WBC 14.5* 14.7*  NEUTROABS 13.3*  --   HGB 14.6 12.6*  HCT 44.0 39.1  MCV 87.5 87.1  PLT 119* 95*   Cardiac Enzymes: No results found for this basename: CKTOTAL, CKMB, CKMBINDEX, TROPONINI,  in the last 168 hours BNP (last 3 results) No results found for this basename: PROBNP,  in the last 8760 hours CBG:  Recent Labs Lab 04/03/14 1105 04/03/14 1617 04/03/14 2151 04/04/14 0815 04/04/14 1200  GLUCAP 171* 95 92 108* 123*    Recent Results (from the past 240 hour(s))  URINE CULTURE     Status: None   Collection Time    04/02/14  5:44 PM      Result Value Ref Range Status   Specimen Description URINE, CLEAN CATCH   Final   Special Requests ADDED 2109   Final   Culture  Setup Time     Final   Value: 04/02/2014 21:15     Performed at [place]     Final   Value: 40,000 COLONIES/ML  Performed at [facility]     Final   Value: Multiple bacterial morphotypes present, none predominant. Suggest appropriate recollection if clinically indicated.     Performed at [facility]   Report Status 04/04/2014 FINAL   Final  CULTURE, BLOOD (ROUTINE X 2)     Status: None   Collection Time    04/02/14  8:36 PM      Result Value Ref Range Status   Specimen Description BLOOD RIGHT ANTECUBITAL   Final   Special Requests BOTTLES DRAWN AEROBIC AND ANAEROBIC 5CC EA   Final   Culture  Setup Time     Final   Value: 04/03/2014 00:54     Performed at [facility]   Culture     Final   Value:        BLOOD CULTURE RECEIVED NO GROWTH TO DATE CULTURE WILL BE HELD FOR 5 DAYS BEFORE ISSUING A FINAL NEGATIVE  REPORT     Performed at [facility]   Report Status PENDING   Incomplete  CULTURE, BLOOD (ROUTINE X 2)     Status: None   Collection Time    04/02/14  8:44 PM      Result Value Ref Range Status   Specimen Description BLOOD LEFT ANTECUBITAL   Final   Special Requests BOTTLES DRAWN AEROBIC ONLY 3CC   Final   Culture  Setup Time     Final   Value: 04/03/2014 00:54     Performed at [facility]   Culture     Final   Value:        BLOOD CULTURE RECEIVED NO GROWTH TO DATE CULTURE WILL BE HELD FOR 5 DAYS BEFORE ISSUING A FINAL NEGATIVE REPORT     Performed at [facility]   Report Status PENDING   Incomplete  MRSA PCR SCREENING     Status: None   Collection Time    04/03/14  1:31 AM      Result Value Ref Range Status   MRSA by PCR NEGATIVE  NEGATIVE Final   Comment:            The GeneXpert MRSA Assay (FDA     approved for NASAL specimens     only), is one component of a     comprehensive MRSA colonization     surveillance program. It is not     intended to diagnose MRSA     infection nor to guide or     monitor treatment for     MRSA infections.     Studies: Dg Chest Port 1 View  04/02/2014   CLINICAL DATA:  Fever.  Leg pain.  Back pain.  EXAM: PORTABLE CHEST - 1 VIEW  COMPARISON:  01/17/2014.  FINDINGS: Cardiomegaly. Median sternotomy. Apical lordotic projection. Pulmonary vascular congestion. No airspace disease. No pleural effusion. Monitoring leads project over the chest.  IMPRESSION: Cardiomegaly and pulmonary vascular congestion.   Electronically Signed   By: Dereck Ligas M.D.   On: 04/02/2014 20:32    Scheduled Meds: . allopurinol  300 mg Oral Daily  . aztreonam  1 g Intravenous Q8H  . digoxin  0.25 mg Oral Daily  . diltiazem  120 mg Oral Daily  . pantoprazole  40 mg Oral Daily  . potassium chloride  40 mEq Oral Daily  . vancomycin  1,000 mg Intravenous Q12H  . warfarin  7.5 mg Oral ONCE-1800  . Warfarin - Pharmacist Dosing Inpatient  Does  not apply q1800   Continuous Infusions:  Antibiotics Given (last 72 hours)   Date/Time Action Medication Dose Rate   04/02/14 2300 Given   aztreonam (AZACTAM) 2 g in dextrose 5 % 50 mL IVPB 2 g 100 mL/hr   04/03/14 0438 Given   metroNIDAZOLE (FLAGYL) IVPB 500 mg 500 mg 100 mL/hr   04/03/14 0800 Given   aztreonam (AZACTAM) 1 g in dextrose 5 % 50 mL IVPB 1 g 100 mL/hr   04/03/14 0933 Given   vancomycin (VANCOCIN) IVPB 1000 mg/200 mL premix 1,000 mg 200 mL/hr   04/03/14 1554 Given   aztreonam (AZACTAM) 1 g in dextrose 5 % 50 mL IVPB 1 g 100 mL/hr   04/03/14 1931 Given   vancomycin (VANCOCIN) IVPB 1000 mg/200 mL premix 1,000 mg 200 mL/hr   04/03/14 2347 Given   aztreonam (AZACTAM) 1 g in dextrose 5 % 50 mL IVPB 1 g 100 mL/hr   04/04/14 8469 Given   aztreonam (AZACTAM) 1 g in dextrose 5 % 50 mL IVPB 1 g 100 mL/hr   04/04/14 0919 Given   vancomycin (VANCOCIN) IVPB 1000 mg/200 mL premix 1,000 mg 200 mL/hr      Principal Problem:   Cellulitis Active Problems:   DIABETES MELLITUS, TYPE II   ATRIAL FIBRILLATION, CHRONIC   AORTIC VALVE REPLACEMENT, HX OF   CAD (coronary artery disease)   SIRS (systemic inflammatory response syndrome)   Sepsis   Hypotension   Venous insufficiency of both lower extremities   Morbid obesity    Time spent: 70min    [facility] Pager [phone]. If 7PM-7AM, please contact night-coverage at [email or web address], password [email or web address] 04/04/2014, 3:44 PM  LOS: 2 days

## 2014-04-05 DIAGNOSIS — Z954 Presence of other heart-valve replacement: Secondary | ICD-10-CM

## 2014-04-05 DIAGNOSIS — I10 Essential (primary) hypertension: Secondary | ICD-10-CM

## 2014-04-05 LAB — CBC
HEMATOCRIT: 40 % (ref 39.0–52.0)
HEMOGLOBIN: 13.3 g/dL (ref 13.0–17.0)
MCH: 29 pg (ref 26.0–34.0)
MCHC: 33.3 g/dL (ref 30.0–36.0)
MCV: 87.3 fL (ref 78.0–100.0)
Platelets: 68 10*3/uL — ABNORMAL LOW (ref 150–400)
RBC: 4.58 MIL/uL (ref 4.22–5.81)
RDW: 16.3 % — ABNORMAL HIGH (ref 11.5–15.5)
WBC: 6.8 10*3/uL (ref 4.0–10.5)

## 2014-04-05 LAB — GLUCOSE, CAPILLARY
Glucose-Capillary: 110 mg/dL — ABNORMAL HIGH (ref 70–99)
Glucose-Capillary: 116 mg/dL — ABNORMAL HIGH (ref 70–99)

## 2014-04-05 LAB — BASIC METABOLIC PANEL
ANION GAP: 12 (ref 5–15)
BUN: 8 mg/dL (ref 6–23)
CALCIUM: 8.5 mg/dL (ref 8.4–10.5)
CO2: 24 mEq/L (ref 19–32)
Chloride: 101 mEq/L (ref 96–112)
Creatinine, Ser: 0.67 mg/dL (ref 0.50–1.35)
GFR calc Af Amer: >90 mL/min (ref >90)
GFR calc non Af Amer: >90 mL/min (ref >90)
GLUCOSE: 96 mg/dL (ref 70–99)
Potassium: 3.8 mEq/L (ref 3.7–5.3)
SODIUM: 137 meq/L (ref 137–147)

## 2014-04-05 LAB — PROTIME-INR
INR: 2.12 — ABNORMAL HIGH (ref 0.00–1.49)
Prothrombin Time: 23.7 seconds — ABNORMAL HIGH (ref 11.6–15.2)

## 2014-04-05 MED ORDER — ATENOLOL 50 MG PO TABS
100.0000 mg | ORAL_TABLET | Freq: Every day | ORAL | Status: DC
  Administered 2014-04-05: 100 mg via ORAL
  Filled 2014-04-05: qty 2

## 2014-04-05 MED ORDER — WARFARIN SODIUM 7.5 MG PO TABS
7.5000 mg | ORAL_TABLET | Freq: Once | ORAL | Status: DC
  Filled 2014-04-05: qty 1

## 2014-04-05 NOTE — Progress Notes (Signed)
ANTICOAGULATION CONSULT NOTE - Follow- Up Consult  Pharmacy Consult for warfarin Indication: Atrial Fibrillation and Mechanical AVR  Allergies  Allergen Reactions  . Penicillins     REACTION: Hives    Patient Measurements: Height: 5\' 1"  (154.9 cm) Weight: 233 lb 14.5 oz (106.1 kg) IBW/kg (Calculated) : 52.3   Vital Signs: Temp: 98.3 F (36.8 C) (08/05 1106) Temp src: Oral (08/05 1106) BP: 119/71 mmHg (08/05 1106) Pulse Rate: 97 (08/05 1106)  Labs:  Recent Labs  04/02/14 1727 04/03/14 0240 04/04/14 0330 04/05/14 0318  HGB 14.6 12.6*  --  13.3  HCT 44.0 39.1  --  40.0  PLT 119* 95*  --  68*  LABPROT 37.7* 34.5* 22.6* 23.7*  INR 3.83* 3.42* 1.99* 2.12*  CREATININE 0.97 0.70  --  0.67     Medical History: Past Medical History  Diagnosis Date  . Diabetes mellitus   . Hyperlipidemia   . Hypertension   . Arthritis   . Leg pain 06/28/2010  . Hiatal hernia   . Atrial fibrillation   . Reflux 11/06/09  . Myocardial infarction age 89  . Varicose veins   . Obesity   . Peripheral vascular disease   . Gout   . CAD (coronary artery disease)     Old scar inferior wall myoview, 10/2009 EF 52%  . Chronic LBP 10/08/2011  . Impaired glucose tolerance 10/08/2011  . Erectile dysfunction 10/08/2011  . Testicular cancer   . Bell's palsy   . TIA (transient ischemic attack)     age 44  . CVA (cerebral infarction)     53yo  . OSA (obstructive sleep apnea)     CPAP  . History of colon polyps   . Anemia   . Hypogonadism male 04/08/2012  . Aortic aneurysm     Medications:  Prescriptions prior to admission  Medication Sig Dispense Refill  . allopurinol (ZYLOPRIM) 300 MG tablet Take 300 mg by mouth daily.      . Ascorbic Acid (VITAMIN C) 1000 MG tablet Take 1,000 mg by mouth daily.        Marland Kitchen atenolol (TENORMIN) 100 MG tablet Take 100 mg by mouth daily. Take 100 mg tablet in morning and 1/2 tablet (50mg ) in the evening.      Marland Kitchen b complex vitamins tablet Take 1 tablet by mouth  daily.        . Calcium-Magnesium-Zinc (CAL-MAG-ZINC PO) Take by mouth daily.        . digoxin (LANOXIN) 0.25 MG tablet Take 0.25 mg by mouth daily.      Marland Kitchen diltiazem (DILACOR XR) 120 MG 24 hr capsule Take 120 mg by mouth daily.      Marland Kitchen esomeprazole (NEXIUM) 40 MG capsule Take 40 mg by mouth daily at 12 noon.      . furosemide (LASIX) 40 MG tablet Take 20-40 mg by mouth daily as needed for fluid.      . methocarbamol (ROBAXIN) 500 MG tablet Take 500 mg by mouth daily as needed for muscle spasms.      Marland Kitchen oxyCODONE-acetaminophen (PERCOCET/ROXICET) 5-325 MG per tablet Take 1 tablet by mouth every 8 (eight) hours as needed for pain. To fill Dec 24, 2012      . temazepam (RESTORIL) 15 MG capsule Take 15 mg by mouth at bedtime as needed for sleep.      . vitamin E (VITAMIN E) 400 UNIT capsule Take 400 Units by mouth daily.        Marland Kitchen warfarin (  COUMADIN) 5 MG tablet Take 5 mg by mouth See admin instructions. Take on Tuesdays, Thursdays and saturdays..      . warfarin (COUMADIN) 7.5 MG tablet Take 7.5 mg by mouth See admin instructions. Take on Mondays, Wednesdays, Fridays, and Sundays      . digoxin (LANOXIN) 0.25 MG tablet Take 1 tablet (250 mcg total) by mouth daily.  90 tablet  3  . INSULIN SYRINGE .5CC/29G (B-D INS SYR ULTRAFINE .5CC/29G) 29G X 1/2" 0.5 ML MISC 1 application by Does not apply route once a week.  100 each  0  . potassium chloride (KLOR-CON 10) 10 MEQ tablet Take 1 tablet (10 mEq total) by mouth daily.  90 tablet  3    Assessment: 53 yo male presents with redness, pain of L thigh, fevers, chills.  On warfarin for chronic AFib and mechanical AVR. INR supratherapeutic on admission, now sub-therapeutic at 1.99.  On warfarin PTA: 5 mg po on Tues/Thurs/Sat and 7.5 mg on all other days   Goal of Therapy:  INR 2.5-3.5 Monitor platelets by anticoagulation protocol: Yes   Plan:  Warfarin 7.5 mg po x1 Daily INR Monitor for s/sx bleeding  Uvaldo Rising, BCPS  Clinical  Pharmacist Pager [phone]  04/05/2014 11:46 AM

## 2014-04-05 NOTE — Progress Notes (Signed)
REceived orders to D/C pt, pt and wife aware of D/C. D/C instructions given to pt, pt verbalizes understanding. No s/s of acute distress noted, VS stable.

## 2014-04-05 NOTE — Care Management Note (Signed)
    Page 1 of 1   04/05/2014     11:00:36 AM CARE MANAGEMENT NOTE 04/05/2014  Patient:  Lance Cook, Lance Cook   Account Number:  [id number]  Date Initiated:  04/03/2014  Documentation initiated by:  Marvetta Gibbons  Subjective/Objective Assessment:   Pt admitted with SIRS- cellulitis     Action/Plan:   PTA pt lived at home with family   Anticipated DC Date:  04/05/2014   Anticipated DC Plan:  HOME/SELF CARE         Choice offered to / List presented to:             Status of service:  Completed, signed off Medicare Important Message given?  YES (If response is "NO", the following Medicare IM given date fields will be blank) Date Medicare IM given:  04/05/2014 Medicare IM given by:  Marvetta Gibbons Date Additional Medicare IM given:   Additional Medicare IM given by:    Discharge Disposition:  HOME/SELF CARE  Per UR Regulation:  Reviewed for med. necessity/level of care/duration of stay  If discussed at [place] of Stay Meetings, dates discussed:    Comments:   04/05/14- 1055- Marvetta Gibbons RN, BSN [phone] Copy of admission Medicare IM given to pt at bedside- no d/c needs identified- pt for possible d/c today.

## 2014-04-09 LAB — CULTURE, BLOOD (ROUTINE X 2)
CULTURE: NO GROWTH
Culture: NO GROWTH

## 2014-04-12 ENCOUNTER — Telehealth: Payer: Self-pay | Admitting: Cardiology

## 2014-04-12 NOTE — Telephone Encounter (Signed)
The pt called in asking he should be seen sooner considering he just had a septic infection which was treated with antibiotics. He also stated that he has a prostetic heart valve which is part of his concern. Please call  Thanks

## 2014-04-12 NOTE — Telephone Encounter (Signed)
Mr.Laden is calling to find out when he is to come back to see Dr. Stanford Breed . There is not a recall to when he is to come back .Marland Kitchen Thanks

## 2014-04-12 NOTE — Telephone Encounter (Signed)
Left message for patient, he was last seen in 04/2013. Per instructions from that visit he needed to be seen in one year. He will call back to schedule

## 2014-04-12 NOTE — Telephone Encounter (Signed)
Gay Filler please address

## 2014-04-14 ENCOUNTER — Ambulatory Visit (INDEPENDENT_AMBULATORY_CARE_PROVIDER_SITE_OTHER): Payer: Medicare HMO | Admitting: Pharmacist Clinician (PhC)/ Clinical Pharmacy Specialist

## 2014-04-14 ENCOUNTER — Ambulatory Visit (INDEPENDENT_AMBULATORY_CARE_PROVIDER_SITE_OTHER): Payer: Medicare HMO | Admitting: Cardiology

## 2014-04-14 ENCOUNTER — Encounter: Payer: Self-pay | Admitting: Cardiology

## 2014-04-14 VITALS — BP 138/85 | HR 64 | Ht 61.0 in | Wt 231.0 lb

## 2014-04-14 DIAGNOSIS — Z5181 Encounter for therapeutic drug level monitoring: Secondary | ICD-10-CM

## 2014-04-14 DIAGNOSIS — Z954 Presence of other heart-valve replacement: Secondary | ICD-10-CM

## 2014-04-14 DIAGNOSIS — I482 Chronic atrial fibrillation, unspecified: Secondary | ICD-10-CM

## 2014-04-14 DIAGNOSIS — I4891 Unspecified atrial fibrillation: Secondary | ICD-10-CM

## 2014-04-14 LAB — POCT INR: INR: 3

## 2014-04-14 MED ORDER — ATENOLOL 100 MG PO TABS: 100.0000 mg | ORAL_TABLET | Freq: Two times a day (BID) | ORAL | Status: DC

## 2014-04-14 NOTE — Addendum Note (Signed)
Addended by: Dolan Amen. on: 04/14/2014 01:54 PM   Modules accepted: Orders

## 2014-04-14 NOTE — Patient Instructions (Signed)
Increase your atenolol to 100 mg two times a day  We are scheduling you for an Echo in Jan. 2016

## 2014-04-14 NOTE — Telephone Encounter (Signed)
Pt is scheduled to see Dr. [name] today at 10:30.  Will check INR at the same time.
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
## 2014-05-05 ENCOUNTER — Ambulatory Visit (INDEPENDENT_AMBULATORY_CARE_PROVIDER_SITE_OTHER): Payer: Medicare HMO | Admitting: Surgery

## 2014-05-05 DIAGNOSIS — I4891 Unspecified atrial fibrillation: Secondary | ICD-10-CM

## 2014-05-05 DIAGNOSIS — Z954 Presence of other heart-valve replacement: Secondary | ICD-10-CM

## 2014-05-05 DIAGNOSIS — Z5181 Encounter for therapeutic drug level monitoring: Secondary | ICD-10-CM

## 2014-05-05 LAB — POCT INR: INR: 4

## 2014-05-12 ENCOUNTER — Ambulatory Visit: Payer: Self-pay | Admitting: Pain Medicine

## 2014-05-19 ENCOUNTER — Ambulatory Visit (INDEPENDENT_AMBULATORY_CARE_PROVIDER_SITE_OTHER): Payer: Medicare HMO | Admitting: *Deleted

## 2014-05-19 DIAGNOSIS — I4891 Unspecified atrial fibrillation: Secondary | ICD-10-CM

## 2014-05-19 DIAGNOSIS — Z954 Presence of other heart-valve replacement: Secondary | ICD-10-CM

## 2014-05-19 DIAGNOSIS — Z5181 Encounter for therapeutic drug level monitoring: Secondary | ICD-10-CM

## 2014-05-19 LAB — POCT INR: INR: 3.6

## 2014-05-23 ENCOUNTER — Emergency Department (HOSPITAL_COMMUNITY)
Admission: EM | Admit: 2014-05-23 | Discharge: 2014-05-23 | Disposition: A | Discharge status: Home / Self Care | Payer: Medicare HMO | Attending: Emergency Medicine | Admitting: Emergency Medicine

## 2014-05-23 ENCOUNTER — Encounter (HOSPITAL_COMMUNITY): Payer: Self-pay | Admitting: Emergency Medicine

## 2014-05-23 ENCOUNTER — Emergency Department (HOSPITAL_COMMUNITY): Payer: Medicare HMO

## 2014-05-23 ENCOUNTER — Telehealth: Payer: Self-pay | Admitting: Cardiology

## 2014-05-23 DIAGNOSIS — E119 Type 2 diabetes mellitus without complications: Secondary | ICD-10-CM | POA: Insufficient documentation

## 2014-05-23 DIAGNOSIS — G4733 Obstructive sleep apnea (adult) (pediatric): Secondary | ICD-10-CM | POA: Insufficient documentation

## 2014-05-23 DIAGNOSIS — M109 Gout, unspecified: Secondary | ICD-10-CM | POA: Diagnosis not present

## 2014-05-23 DIAGNOSIS — R197 Diarrhea, unspecified: Secondary | ICD-10-CM | POA: Insufficient documentation

## 2014-05-23 DIAGNOSIS — Z860100 Personal history of colon polyps, unspecified: Secondary | ICD-10-CM | POA: Insufficient documentation

## 2014-05-23 DIAGNOSIS — R404 Transient alteration of awareness: Secondary | ICD-10-CM | POA: Insufficient documentation

## 2014-05-23 DIAGNOSIS — F17200 Nicotine dependence, unspecified, uncomplicated: Secondary | ICD-10-CM | POA: Insufficient documentation

## 2014-05-23 DIAGNOSIS — Z9981 Dependence on supplemental oxygen: Secondary | ICD-10-CM | POA: Insufficient documentation

## 2014-05-23 DIAGNOSIS — I252 Old myocardial infarction: Secondary | ICD-10-CM | POA: Diagnosis not present

## 2014-05-23 DIAGNOSIS — K219 Gastro-esophageal reflux disease without esophagitis: Secondary | ICD-10-CM | POA: Diagnosis not present

## 2014-05-23 DIAGNOSIS — R55 Syncope and collapse: Secondary | ICD-10-CM | POA: Insufficient documentation

## 2014-05-23 DIAGNOSIS — I4891 Unspecified atrial fibrillation: Secondary | ICD-10-CM | POA: Diagnosis not present

## 2014-05-23 DIAGNOSIS — G8929 Other chronic pain: Secondary | ICD-10-CM | POA: Diagnosis not present

## 2014-05-23 DIAGNOSIS — Z880 Allergy status to penicillin: Secondary | ICD-10-CM | POA: Insufficient documentation

## 2014-05-23 DIAGNOSIS — Z8739 Personal history of other diseases of the musculoskeletal system and connective tissue: Secondary | ICD-10-CM | POA: Diagnosis not present

## 2014-05-23 DIAGNOSIS — I2510 Atherosclerotic heart disease of native coronary artery without angina pectoris: Secondary | ICD-10-CM | POA: Insufficient documentation

## 2014-05-23 DIAGNOSIS — Z79899 Other long term (current) drug therapy: Secondary | ICD-10-CM | POA: Insufficient documentation

## 2014-05-23 DIAGNOSIS — Z7901 Long term (current) use of anticoagulants: Secondary | ICD-10-CM | POA: Insufficient documentation

## 2014-05-23 DIAGNOSIS — Z8673 Personal history of transient ischemic attack (TIA), and cerebral infarction without residual deficits: Secondary | ICD-10-CM | POA: Insufficient documentation

## 2014-05-23 DIAGNOSIS — E669 Obesity, unspecified: Secondary | ICD-10-CM | POA: Diagnosis not present

## 2014-05-23 DIAGNOSIS — Z792 Long term (current) use of antibiotics: Secondary | ICD-10-CM | POA: Diagnosis not present

## 2014-05-23 DIAGNOSIS — I719 Aortic aneurysm of unspecified site, without rupture: Secondary | ICD-10-CM | POA: Diagnosis not present

## 2014-05-23 DIAGNOSIS — Z862 Personal history of diseases of the blood and blood-forming organs and certain disorders involving the immune mechanism: Secondary | ICD-10-CM | POA: Insufficient documentation

## 2014-05-23 DIAGNOSIS — I10 Essential (primary) hypertension: Secondary | ICD-10-CM | POA: Insufficient documentation

## 2014-05-23 DIAGNOSIS — Z8547 Personal history of malignant neoplasm of testis: Secondary | ICD-10-CM | POA: Diagnosis not present

## 2014-05-23 LAB — COMPREHENSIVE METABOLIC PANEL
ALBUMIN: 3.3 g/dL — AB (ref 3.5–5.2)
ALK PHOS: 53 U/L (ref 39–117)
ALT: 19 U/L (ref 0–53)
ANION GAP: 8 (ref 5–15)
AST: 22 U/L (ref 0–37)
BUN: 15 mg/dL (ref 6–23)
CO2: 31 mEq/L (ref 19–32)
Calcium: 9.4 mg/dL (ref 8.4–10.5)
Chloride: 100 mEq/L (ref 96–112)
Creatinine, Ser: 0.87 mg/dL (ref 0.50–1.35)
GFR calc non Af Amer: >90 mL/min (ref >90)
GLUCOSE: 97 mg/dL (ref 70–99)
POTASSIUM: 4.1 meq/L (ref 3.7–5.3)
Sodium: 139 mEq/L (ref 137–147)
TOTAL PROTEIN: 6.3 g/dL (ref 6.0–8.3)
Total Bilirubin: 0.9 mg/dL (ref 0.3–1.2)

## 2014-05-23 LAB — I-STAT TROPONIN, ED: Troponin i, poc: 0.01 ng/mL (ref 0.00–0.08)

## 2014-05-23 LAB — CBC
HEMATOCRIT: 42.4 % (ref 39.0–52.0)
Hemoglobin: 14.3 g/dL (ref 13.0–17.0)
MCH: 29.6 pg (ref 26.0–34.0)
MCHC: 33.7 g/dL (ref 30.0–36.0)
MCV: 87.8 fL (ref 78.0–100.0)
Platelets: 147 10*3/uL — ABNORMAL LOW (ref 150–400)
RBC: 4.83 MIL/uL (ref 4.22–5.81)
RDW: 15.1 % (ref 11.5–15.5)
WBC: 8.7 10*3/uL (ref 4.0–10.5)

## 2014-05-23 LAB — PROTIME-INR
INR: 3.7 — AB (ref 0.00–1.49)
PROTHROMBIN TIME: 36.7 s — AB (ref 11.6–15.2)

## 2014-05-23 MED ORDER — SODIUM CHLORIDE 0.9 % IV BOLUS (SEPSIS)
1000.0000 mL | Freq: Once | INTRAVENOUS | Status: AC
  Administered 2014-05-23: 1000 mL via INTRAVENOUS

## 2014-05-23 NOTE — ED Notes (Signed)
Pt states he was started on metformin and it has made his diarrhea worse, states that he passed out on Friday and then agagin sat or sun for loc of a few mins last time states woke up on floor unsure of length of time  States did not hit head

## 2014-05-23 NOTE — ED Notes (Signed)
Fall risk band placewd on patient

## 2014-05-23 NOTE — Discharge Instructions (Signed)
Please follow the directions provided.  Be sure to follow up with your primary care provider regarding your diabetes.  Continue to stay well-hydrated.  Don't hesitate to return if you have another episode or any other concerns.    SEEK IMMEDIATE MEDICAL CARE IF:  You have a severe headache.  You have unusual pain in the chest, abdomen, or back.  You are bleeding from your mouth or rectum, or you have black or tarry stool.  You have an irregular or very fast heartbeat.  You have pain with breathing.  You have repeated fainting or seizure-like jerking during an episode.  You faint when sitting or lying down.  You have confusion.  You have trouble walking.  You have severe weakness.  You have vision problems. If you fainted, call your local emergency services (911 in [place] Do not drive yourself to the hospital.

## 2014-05-23 NOTE — Telephone Encounter (Signed)
Patient called and stated he saw PCP today. PCP instructed patient to go to ER to be evaluated. He states he passed out over the weekend.  Recently started on METFORMIN 3 WEEKS AGO . PCP wanted him to continue, also having watery stools - PCP question dehydration and ekg issues Patient wanted Dr [name] to be aware- patient states he will let ER know RN informed patient that ER doctor will evaluate if they think cardiology is needed they will contact cardiology service. Patient verbalized understanding.  RN contacted Engineer, maintenance (IT) for [employer]

## 2014-05-23 NOTE — ED Provider Notes (Signed)
CSN: [id number]     Arrival date & time 05/23/14  1357 History   First MD Initiated Contact with Patient 05/23/14 1554     Chief Complaint  Patient presents with  . Loss of Consciousness   (Consider location/radiation/quality/duration/timing/severity/associated sxs/prior Treatment) HPI Lance Cook is a 53 yo male presenting with report of passing out. He states he has been having increased diarrhea since beginning Metformin.  He states 4 days ago he was not feeling well with diarrhea and feeling weak.  He began to feel light headed and needed to lay down, but was able to get up soon after.  2 days later, he had another episode of feeling light headed and needed to lay down again but feels like he passed out briefly before he was able to stand back up.  He has had 2 episodes of diarrhea today.  He denies headache, fever, chest pain, shortness of breath, nausea, vomiting.   Past Medical History  Diagnosis Date  . Diabetes mellitus   . Hyperlipidemia   . Hypertension   . Arthritis   . Leg pain 06/28/2010  . Hiatal hernia   . Atrial fibrillation   . Reflux 11/06/09  . Myocardial infarction age 63  . Varicose veins   . Obesity   . Peripheral vascular disease   . Gout   . CAD (coronary artery disease)     Old scar inferior wall myoview, 10/2009 EF 52%  . Chronic LBP 10/08/2011  . Impaired glucose tolerance 10/08/2011  . Erectile dysfunction 10/08/2011  . Testicular cancer   . Bell's palsy   . TIA (transient ischemic attack)     age 22  . CVA (cerebral infarction)     53yo  . OSA (obstructive sleep apnea)     CPAP  . History of colon polyps   . Anemia   . Hypogonadism male 04/08/2012  . Aortic aneurysm    Past Surgical History  Procedure Laterality Date  . Laser ablation  03/06/2010    left leg  . Cardiac valve replacement      aortic valve replaced mechanical age 74 after trauma, SVG to RCA at that time  . Cholecystectomy  2011  . Tonsillectomy  1967  . Otoplasty      bilateral  .  Surgery scrotal / testicular      cancer   Family History  Problem Relation Age of Onset  . Heart disease Mother   . Throat cancer Mother   . Diabetes Father   . Stroke Other   . Hypertension Other   . Cancer Maternal Aunt     lung, brain  . Cancer Maternal Uncle     brain aneurysm   History  Substance Use Topics  . Smoking status: Current Some Day Smoker    Types: Cigars  . Smokeless tobacco: Never Used     Comment: about 3 yearly  . Alcohol Use: Yes     Comment: social    Review of Systems  Constitutional: Negative for fever and chills.  HENT: Negative for sore throat.   Eyes: Negative for visual disturbance.  Respiratory: Negative for cough and shortness of breath.   Cardiovascular: Negative for chest pain and leg swelling.  Gastrointestinal: Positive for diarrhea. Negative for nausea and vomiting.  Genitourinary: Negative for dysuria.  Musculoskeletal: Negative for myalgias.  Skin: Negative for rash.  Neurological: Negative for weakness, numbness and headaches.    Allergies  Penicillins  Home Medications   Prior to Admission medications  Medication Sig Start Date End Date Taking? Authorizing Provider  allopurinol (ZYLOPRIM) 300 MG tablet Take 300 mg by mouth daily. 11/17/12   Biagio Borg, MD  Ascorbic Acid (VITAMIN C) 1000 MG tablet Take 1,000 mg by mouth daily.      Historical Provider, MD  atenolol (TENORMIN) 100 MG tablet Take 100 mg by mouth 2 (two) times daily. 04/14/14   [name], MD  b complex vitamins tablet Take 1 tablet by mouth daily.      Historical Provider, MD  Calcium-Magnesium-Zinc (CAL-MAG-ZINC PO) Take by mouth daily.      Historical Provider, MD  digoxin (LANOXIN) 0.25 MG tablet Take 0.25 mg by mouth daily.    Historical Provider, MD  diltiazem (DILACOR XR) 120 MG 24 hr capsule Take 120 mg by mouth daily.    Historical Provider, MD  doxycycline (VIBRA-TABS) 100 MG tablet Take 1 tablet (100 mg total) by mouth 2 (two) times daily. For 7  days 04/04/14   Domenic Polite, MD  esomeprazole (NEXIUM) 40 MG capsule Take 40 mg by mouth daily at 12 noon.    Historical Provider, MD  furosemide (LASIX) 40 MG tablet Take 20-40 mg by mouth daily as needed for fluid. 10/08/11   Biagio Borg, MD  levofloxacin (LEVAQUIN) 500 MG tablet Take 1 tablet (500 mg total) by mouth daily. For 7 days 04/04/14   Domenic Polite, MD  metFORMIN (GLUCOPHAGE-XR) 500 MG 24 hr tablet  03/14/14   Historical Provider, MD  methocarbamol (ROBAXIN) 500 MG tablet Take 500 mg by mouth daily as needed for muscle spasms. 10/08/11   Biagio Borg, MD  oxyCODONE-acetaminophen (PERCOCET/ROXICET) 5-325 MG per tablet Take 1 tablet by mouth every 8 (eight) hours as needed for pain. To fill Dec 24, 2012 10/27/12   Biagio Borg, MD  potassium chloride (KLOR-CON 10) 10 MEQ tablet Take 1 tablet (10 mEq total) by mouth daily. 11/23/12 11/23/13  Biagio Borg, MD  temazepam (RESTORIL) 15 MG capsule Take 15 mg by mouth at bedtime as needed for sleep.    Historical Provider, MD  vitamin E (VITAMIN E) 400 UNIT capsule Take 400 Units by mouth daily.      Historical Provider, MD  warfarin (COUMADIN) 5 MG tablet Take 5 mg by mouth See admin instructions. Take on Tuesdays, Thursdays and saturdays.. 03/23/14 03/22/16  [name], MD  warfarin (COUMADIN) 7.5 MG tablet Take 7.5 mg by mouth See admin instructions. Take on Mondays, Wednesdays, Fridays, and Sundays    Historical Provider, MD   BP 115/65  Pulse 70  Temp(Src) 98.2 F (36.8 C) (Oral)  Resp 17  Ht 5\' 1"  (1.549 m)  Wt 230 lb (104.327 kg)  BMI 43.48 kg/m2  SpO2 92% Physical Exam  Nursing note and vitals reviewed. Constitutional: He is oriented to person, place, and time. He appears well-developed and well-nourished. No distress.  HENT:  Head: Normocephalic and atraumatic.  Mouth/Throat: Oropharynx is clear and moist. No oropharyngeal exudate.  Eyes: Conjunctivae are normal.  Neck: Neck supple. No thyromegaly present.  Cardiovascular:  Normal rate, S1 normal, S2 normal and intact distal pulses.  An irregular rhythm present. Exam reveals no gallop and no friction rub.   No murmur heard. Pulmonary/Chest: Effort normal and breath sounds normal. No respiratory distress. He has no wheezes. He has no rales. He exhibits no tenderness.  Abdominal: Soft. There is no tenderness.  Musculoskeletal: He exhibits no tenderness.  Lymphadenopathy:    He has no cervical adenopathy.  Neurological: He is alert and oriented to person, place, and time. He has normal strength. No cranial nerve deficit or sensory deficit. Coordination normal. GCS eye subscore is 4. GCS verbal subscore is 5. GCS motor subscore is 6.  Skin: Skin is warm and dry. No rash noted. He is not diaphoretic.     Psychiatric: He has a normal mood and affect.    ED Course  Procedures (including critical care time) Labs Review Labs Reviewed  CBC - Abnormal; Notable for the following:    Platelets 147 (*)    All other components within normal limits  COMPREHENSIVE METABOLIC PANEL - Abnormal; Notable for the following:    Albumin 3.3 (*)    All other components within normal limits  PROTIME-INR - Abnormal; Notable for the following:    Prothrombin Time 36.7 (*)    INR 3.70 (*)    All other components within normal limits  I-STAT TROPOININ, ED    Imaging Review Dg Chest 2 View  05/23/2014   CLINICAL DATA:  Two syncopal episodes 2 days ago  EXAM: CHEST  2 VIEW  COMPARISON:  April 02, 2014  FINDINGS: The heart size is enlarged. Mediastinal contour is unchanged. Patient is status post prior median sternotomy. There is pulmonary vascular congestion. There is no focal pneumonia or pleural effusion. The osseous structures are stable.  IMPRESSION: Cardiomegaly and pulmonary vascular congestion unchanged compared to prior exam.   Electronically Signed   By: Abelardo Diesel M.D.   On: 05/23/2014 15:04     EKG Interpretation   Date/Time:  Tuesday May 23 2014 14:04:49  EDT Ventricular Rate:  69 PR Interval:    QRS Duration: 110 QT Interval:  402 QTC Calculation: 430 R Axis:   42 Text Interpretation:  Atrial fibrillation Incomplete left bundle branch  block Non-specific ST-t changes No significant change since last tracing  Confirmed by Ashok Cordia  MD, Lennette Bihari ([id number]) on 05/23/2014 4:05:15 PM      MDM   Final diagnoses:  Syncope, unspecified syncope type   53 yo male with near syncopal episodes.  Consider dehydration vs ACS.  CBC, CMP, Troponin, PT/INR, EKG and CXR without acute changes or abnormality.  Pt has been instructed to stop taking his metformin by his PCP which was causing his diarrhea.  IVF bolus given.  He continues to be without symptoms in the ED.  No decrease in bp or heart rate change with orthostatic vital signs.  Pt appears safe to be discharged.  Discharge instructions include encouraging PO intake, holding metformin as instructed by PCP and follow-up with PCP.  Pt aware of plan and in agreement.  Return precautions provided.  Case discussed with Dr. Ashok Cordia.   Filed Vitals:   05/23/14 1616 05/23/14 1617 05/23/14 1619 05/23/14 1800  BP: 129/57 144/77 149/93 130/68  Pulse: 74 75 72   Temp:      TempSrc:      Resp:      Height:      Weight:      SpO2:    95%    Meds given in ED:  Medications  sodium chloride 0.9 % bolus 1,000 mL (1,000 mLs Intravenous New Bag/Given 05/23/14 1729)    New Prescriptions   No medications on file     Britt Bottom, NP 05/27/14 1009

## 2014-05-27 NOTE — ED Provider Notes (Signed)
Medical screening examination/treatment/procedure(s) were conducted as a shared visit with non-physician practitioner(s) and myself.  I personally evaluated the patient during the encounter.   EKG Interpretation   Date/Time:  Tuesday May 23 2014 14:04:49 EDT Ventricular Rate:  69 PR Interval:    QRS Duration: 110 QT Interval:  402 QTC Calculation: 430 R Axis:   42 Text Interpretation:  Atrial fibrillation Incomplete left bundle branch  block Non-specific ST-t changes No significant change since last tracing  Confirmed by Aundria Bitterman  MD, Lennette Bihari ([id number]) on 05/23/2014 4:05:15 PM      Pt c/o diarrhea, feeling weak. Currently no abd pain or tenderness on exam. Afeb. Labs.   Mirna Mires, MD 05/27/14 2103

## 2014-06-08 ENCOUNTER — Ambulatory Visit (INDEPENDENT_AMBULATORY_CARE_PROVIDER_SITE_OTHER): Payer: Medicare HMO | Admitting: Pharmacist Clinician (PhC)/ Clinical Pharmacy Specialist

## 2014-06-08 ENCOUNTER — Telehealth: Payer: Self-pay | Admitting: Cardiology

## 2014-06-08 DIAGNOSIS — I4891 Unspecified atrial fibrillation: Secondary | ICD-10-CM

## 2014-06-08 DIAGNOSIS — Z952 Presence of prosthetic heart valve: Secondary | ICD-10-CM

## 2014-06-08 DIAGNOSIS — Z954 Presence of other heart-valve replacement: Secondary | ICD-10-CM

## 2014-06-08 DIAGNOSIS — Z5181 Encounter for therapeutic drug level monitoring: Secondary | ICD-10-CM

## 2014-06-08 LAB — POCT INR: INR: 4.5

## 2014-06-08 NOTE — Telephone Encounter (Signed)
Pt called in wanting to speak to Dr. Rosezella [place] nurse about his new coumadin dosages. Please call  Thanks

## 2014-06-08 NOTE — Telephone Encounter (Signed)
Pt. Was concerned about his coumadin dosage and his INR past to checks and wanted you to be aware that he thought that since his last check was 4.5 that he should hold his coumadin for two days instead of just one and then recheck it at the end of the day tomarrow, he just wanted your thoughts

## 2014-06-08 NOTE — Telephone Encounter (Signed)
He needs to follow the advice of our  Coumadin expert.

## 2014-06-09 NOTE — Telephone Encounter (Signed)
LM per Dr. [name] to follow Erasmo Downer PharmD's advise on coumadin dosing.

## 2014-06-09 NOTE — Telephone Encounter (Signed)
Pt. Called and instructed to follow the direction of the Pharm D, Pt. Agreed with plan

## 2014-06-23 ENCOUNTER — Ambulatory Visit (INDEPENDENT_AMBULATORY_CARE_PROVIDER_SITE_OTHER): Payer: Medicare HMO

## 2014-06-23 DIAGNOSIS — Z5181 Encounter for therapeutic drug level monitoring: Secondary | ICD-10-CM

## 2014-06-23 DIAGNOSIS — I4891 Unspecified atrial fibrillation: Secondary | ICD-10-CM

## 2014-06-23 DIAGNOSIS — Z952 Presence of prosthetic heart valve: Secondary | ICD-10-CM

## 2014-06-23 DIAGNOSIS — Z954 Presence of other heart-valve replacement: Secondary | ICD-10-CM

## 2014-06-23 LAB — POCT INR: INR: 2.5

## 2014-07-14 ENCOUNTER — Ambulatory Visit (INDEPENDENT_AMBULATORY_CARE_PROVIDER_SITE_OTHER): Payer: Medicare HMO | Admitting: *Deleted

## 2014-07-14 DIAGNOSIS — Z954 Presence of other heart-valve replacement: Secondary | ICD-10-CM

## 2014-07-14 DIAGNOSIS — I4891 Unspecified atrial fibrillation: Secondary | ICD-10-CM

## 2014-07-14 DIAGNOSIS — Z952 Presence of prosthetic heart valve: Secondary | ICD-10-CM

## 2014-07-14 DIAGNOSIS — Z5181 Encounter for therapeutic drug level monitoring: Secondary | ICD-10-CM

## 2014-07-14 LAB — POCT INR: INR: 3.5

## 2014-07-18 ENCOUNTER — Ambulatory Visit: Payer: Self-pay | Admitting: Pain Medicine

## 2014-08-11 ENCOUNTER — Ambulatory Visit (INDEPENDENT_AMBULATORY_CARE_PROVIDER_SITE_OTHER): Payer: Medicare HMO | Admitting: Pharmacist

## 2014-08-11 DIAGNOSIS — Z952 Presence of prosthetic heart valve: Secondary | ICD-10-CM

## 2014-08-11 DIAGNOSIS — Z954 Presence of other heart-valve replacement: Secondary | ICD-10-CM

## 2014-08-11 DIAGNOSIS — Z5181 Encounter for therapeutic drug level monitoring: Secondary | ICD-10-CM

## 2014-08-11 DIAGNOSIS — I4891 Unspecified atrial fibrillation: Secondary | ICD-10-CM

## 2014-08-11 LAB — POCT INR: INR: 4.4

## 2014-08-16 ENCOUNTER — Telehealth (HOSPITAL_COMMUNITY): Payer: Self-pay | Admitting: *Deleted

## 2014-09-01 HISTORY — PX: TOOTH EXTRACTION: SUR596

## 2014-09-04 ENCOUNTER — Ambulatory Visit (INDEPENDENT_AMBULATORY_CARE_PROVIDER_SITE_OTHER): Payer: Medicare HMO | Admitting: *Deleted

## 2014-09-04 DIAGNOSIS — Z954 Presence of other heart-valve replacement: Secondary | ICD-10-CM

## 2014-09-04 DIAGNOSIS — Z5181 Encounter for therapeutic drug level monitoring: Secondary | ICD-10-CM

## 2014-09-04 DIAGNOSIS — Z952 Presence of prosthetic heart valve: Secondary | ICD-10-CM

## 2014-09-04 DIAGNOSIS — I4891 Unspecified atrial fibrillation: Secondary | ICD-10-CM

## 2014-09-04 LAB — POCT INR: INR: 4.6

## 2014-09-11 ENCOUNTER — Ambulatory Visit (HOSPITAL_COMMUNITY)
Admission: RE | Admit: 2014-09-11 | Discharge: 2014-09-11 | Disposition: A | Discharge status: Home / Self Care | Payer: Medicare HMO | Source: Ambulatory Visit | Attending: Cardiology | Admitting: Cardiology

## 2014-09-11 DIAGNOSIS — Z8673 Personal history of transient ischemic attack (TIA), and cerebral infarction without residual deficits: Secondary | ICD-10-CM | POA: Insufficient documentation

## 2014-09-11 DIAGNOSIS — C6290 Malignant neoplasm of unspecified testis, unspecified whether descended or undescended: Secondary | ICD-10-CM | POA: Diagnosis not present

## 2014-09-11 DIAGNOSIS — I482 Chronic atrial fibrillation, unspecified: Secondary | ICD-10-CM

## 2014-09-11 DIAGNOSIS — G510 Bell's palsy: Secondary | ICD-10-CM | POA: Insufficient documentation

## 2014-09-11 DIAGNOSIS — Z952 Presence of prosthetic heart valve: Secondary | ICD-10-CM | POA: Insufficient documentation

## 2014-09-11 DIAGNOSIS — I340 Nonrheumatic mitral (valve) insufficiency: Secondary | ICD-10-CM | POA: Insufficient documentation

## 2014-09-11 DIAGNOSIS — I10 Essential (primary) hypertension: Secondary | ICD-10-CM | POA: Insufficient documentation

## 2014-09-11 DIAGNOSIS — G4733 Obstructive sleep apnea (adult) (pediatric): Secondary | ICD-10-CM | POA: Diagnosis not present

## 2014-09-11 DIAGNOSIS — I252 Old myocardial infarction: Secondary | ICD-10-CM | POA: Insufficient documentation

## 2014-09-11 DIAGNOSIS — I358 Other nonrheumatic aortic valve disorders: Secondary | ICD-10-CM | POA: Diagnosis not present

## 2014-09-11 DIAGNOSIS — I4891 Unspecified atrial fibrillation: Secondary | ICD-10-CM | POA: Insufficient documentation

## 2014-09-11 DIAGNOSIS — I2510 Atherosclerotic heart disease of native coronary artery without angina pectoris: Secondary | ICD-10-CM | POA: Insufficient documentation

## 2014-09-11 DIAGNOSIS — R600 Localized edema: Secondary | ICD-10-CM | POA: Diagnosis not present

## 2014-09-11 DIAGNOSIS — Z87898 Personal history of other specified conditions: Secondary | ICD-10-CM | POA: Insufficient documentation

## 2014-09-11 DIAGNOSIS — I511 Rupture of chordae tendineae, not elsewhere classified: Secondary | ICD-10-CM | POA: Diagnosis not present

## 2014-09-11 DIAGNOSIS — I272 Other secondary pulmonary hypertension: Secondary | ICD-10-CM | POA: Insufficient documentation

## 2014-09-11 NOTE — Progress Notes (Addendum)
2D Echocardiogram Complete.  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  There is Moderate Mitral Valve Regurgitation.

## 2014-09-15 ENCOUNTER — Ambulatory Visit (INDEPENDENT_AMBULATORY_CARE_PROVIDER_SITE_OTHER): Payer: Medicare HMO | Admitting: Pharmacist

## 2014-09-15 DIAGNOSIS — Z952 Presence of prosthetic heart valve: Secondary | ICD-10-CM

## 2014-09-15 DIAGNOSIS — I4891 Unspecified atrial fibrillation: Secondary | ICD-10-CM

## 2014-09-15 DIAGNOSIS — Z5181 Encounter for therapeutic drug level monitoring: Secondary | ICD-10-CM

## 2014-09-15 DIAGNOSIS — Z954 Presence of other heart-valve replacement: Secondary | ICD-10-CM

## 2014-09-15 LAB — POCT INR: INR: 2.9

## 2014-09-29 ENCOUNTER — Ambulatory Visit (INDEPENDENT_AMBULATORY_CARE_PROVIDER_SITE_OTHER): Payer: Medicare HMO | Admitting: Pharmacist

## 2014-09-29 ENCOUNTER — Telehealth: Payer: Self-pay | Admitting: Cardiology

## 2014-09-29 DIAGNOSIS — Z5181 Encounter for therapeutic drug level monitoring: Secondary | ICD-10-CM

## 2014-09-29 DIAGNOSIS — I4891 Unspecified atrial fibrillation: Secondary | ICD-10-CM

## 2014-09-29 DIAGNOSIS — Z952 Presence of prosthetic heart valve: Secondary | ICD-10-CM

## 2014-09-29 DIAGNOSIS — Z954 Presence of other heart-valve replacement: Secondary | ICD-10-CM

## 2014-09-29 LAB — POCT INR: INR: 4.1

## 2014-10-06 ENCOUNTER — Encounter: Payer: Self-pay | Admitting: Cardiology

## 2014-10-06 ENCOUNTER — Ambulatory Visit (INDEPENDENT_AMBULATORY_CARE_PROVIDER_SITE_OTHER): Payer: Medicare HMO | Admitting: Cardiology

## 2014-10-06 VITALS — BP 138/80 | HR 71 | Ht 61.0 in | Wt 242.9 lb

## 2014-10-06 DIAGNOSIS — I340 Nonrheumatic mitral (valve) insufficiency: Secondary | ICD-10-CM

## 2014-10-06 DIAGNOSIS — I890 Lymphedema, not elsewhere classified: Secondary | ICD-10-CM

## 2014-10-06 NOTE — Patient Instructions (Signed)
We are setting you up for a TEE  We are setting you up for Physical therapy for your lymph edema
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
## 2014-10-10 ENCOUNTER — Other Ambulatory Visit: Payer: Self-pay | Admitting: *Deleted

## 2014-10-10 DIAGNOSIS — Z01812 Encounter for preprocedural laboratory examination: Secondary | ICD-10-CM

## 2014-10-11 ENCOUNTER — Encounter (HOSPITAL_COMMUNITY)
Admission: RE | Disposition: A | Discharge status: Home / Self Care | Payer: Self-pay | Source: Ambulatory Visit | Attending: Internal Medicine

## 2014-10-11 ENCOUNTER — Encounter (HOSPITAL_COMMUNITY): Payer: Self-pay

## 2014-10-11 ENCOUNTER — Ambulatory Visit (HOSPITAL_COMMUNITY)
Admission: RE | Admit: 2014-10-11 | Discharge: 2014-10-11 | Disposition: A | Discharge status: Home / Self Care | Payer: Medicare HMO | Source: Ambulatory Visit | Attending: Internal Medicine | Admitting: Internal Medicine

## 2014-10-11 DIAGNOSIS — Z01812 Encounter for preprocedural laboratory examination: Secondary | ICD-10-CM

## 2014-10-11 DIAGNOSIS — I351 Nonrheumatic aortic (valve) insufficiency: Secondary | ICD-10-CM | POA: Diagnosis not present

## 2014-10-11 DIAGNOSIS — I371 Nonrheumatic pulmonary valve insufficiency: Secondary | ICD-10-CM | POA: Diagnosis not present

## 2014-10-11 DIAGNOSIS — F17210 Nicotine dependence, cigarettes, uncomplicated: Secondary | ICD-10-CM | POA: Insufficient documentation

## 2014-10-11 DIAGNOSIS — I071 Rheumatic tricuspid insufficiency: Secondary | ICD-10-CM | POA: Insufficient documentation

## 2014-10-11 DIAGNOSIS — I4891 Unspecified atrial fibrillation: Secondary | ICD-10-CM | POA: Diagnosis present

## 2014-10-11 DIAGNOSIS — I340 Nonrheumatic mitral (valve) insufficiency: Secondary | ICD-10-CM | POA: Diagnosis present

## 2014-10-11 HISTORY — PX: TEE WITHOUT CARDIOVERSION: SHX5443

## 2014-10-11 LAB — GLUCOSE, CAPILLARY: GLUCOSE-CAPILLARY: 119 mg/dL — AB (ref 70–99)

## 2014-10-11 SURGERY — ECHOCARDIOGRAM, TRANSESOPHAGEAL
Anesthesia: Moderate Sedation

## 2014-10-11 MED ORDER — FENTANYL CITRATE 0.05 MG/ML IJ SOLN
INTRAMUSCULAR | Status: AC
  Filled 2014-10-11: qty 2

## 2014-10-11 MED ORDER — MIDAZOLAM HCL 10 MG/2ML IJ SOLN
INTRAMUSCULAR | Status: DC | PRN
  Administered 2014-10-11: 1 mg via INTRAVENOUS
  Administered 2014-10-11 (×2): 2 mg via INTRAVENOUS
  Administered 2014-10-11: 1 mg via INTRAVENOUS

## 2014-10-11 MED ORDER — SODIUM CHLORIDE 0.9 % IV SOLN: INTRAVENOUS | Status: DC

## 2014-10-11 MED ORDER — FENTANYL CITRATE 0.05 MG/ML IJ SOLN
INTRAMUSCULAR | Status: DC | PRN
  Administered 2014-10-11 (×2): 25 ug via INTRAVENOUS

## 2014-10-11 MED ORDER — LIDOCAINE VISCOUS 2 % MT SOLN
OROMUCOSAL | Status: AC
  Filled 2014-10-11: qty 15

## 2014-10-11 MED ORDER — MIDAZOLAM HCL 5 MG/ML IJ SOLN
INTRAMUSCULAR | Status: AC
  Filled 2014-10-11: qty 2

## 2014-10-11 MED ORDER — BUTAMBEN-TETRACAINE-BENZOCAINE 2-2-14 % EX AERO
INHALATION_SPRAY | CUTANEOUS | Status: DC | PRN
  Administered 2014-10-11: 2 via TOPICAL

## 2014-10-11 NOTE — H&P (Signed)
     INTERVAL PROCEDURE H&P  History and Physical Interval Note:  10/11/2014 10:25 AM  [name] has presented today for their planned procedure. The various methods of treatment have been discussed with the patient and family. After consideration of risks, benefits and other options for treatment, the patient has consented to the procedure.  The patients' outpatient history has been reviewed, patient examined, and no change in status from most recent office note within the past 30 days. I have reviewed the patients' chart and labs and will proceed as planned. Questions were answered to the patient's satisfaction.   Pixie Casino, MD, [facility] Attending Cardiologist [facility]  Gift Rueckert C 10/11/2014, 10:25 AM

## 2014-10-11 NOTE — CV Procedure (Addendum)
TRANSESOPHAGEAL ECHOCARDIOGRAM (TEE) NOTE  INDICATIONS: mechanical AVR, moderate to severe MR with flail leaflet  PROCEDURE:   Informed consent was obtained prior to the procedure. The risks, benefits and alternatives for the procedure were discussed and the patient comprehended these risks.  Risks include, but are not limited to, cough, sore throat, vomiting, nausea, somnolence, esophageal and stomach trauma or perforation, bleeding, low blood pressure, aspiration, pneumonia, infection, trauma to the teeth and death.    After a procedural time-out, the patient was given 6 mg versed and 50 mcg fentanyl for moderate sedation.  The oropharynx was anesthetized 10 cc of topical 1% viscous lidocaine and 2 cetacaine sprays.  The transesophageal probe was inserted in the esophagus and stomach without difficulty and multiple views were obtained.  The patient was kept under observation until the patient left the procedure room.  The patient left the procedure room in stable condition.   Agitated microbubble saline contrast was administered.  COMPLICATIONS:    There were no immediate complications.  Findings:  1. LEFT VENTRICLE: The left ventricular wall thickness is moderately increased.  The left ventricular cavity is normal in size. Wall motion is hyperdynamic.  LVEF is 65%.  2. RIGHT VENTRICLE:  The right ventricle is normal in structure and function without any thrombus or masses.    3. LEFT ATRIUM:  The left atrium is massively dilated in size without any thrombus or masses.  There is spontaneous echo contrast ("smoke") in the left atrium consistent with a low flow state.  4. LEFT ATRIAL APPENDAGE:  The left atrial appendage is free of any thrombus or masses. The appendage has single lobes. Pulse doppler indicates low flow in the appendage.  5. ATRIAL SEPTUM:  The atrial septum appears intact and is free of thrombus and/or masses and bows from left to right.  There is no evidence for  interatrial shunting by color doppler and saline microbubble.  6. RIGHT ATRIUM:  The right atrium is normal in size and function without any thrombus or masses.  7. MITRAL VALVE:  The mitral valve is thickened and redundant. There is a flail subvalvular cord supporting the P2 segement of the posterior leaflet which prolapses across the mitral valve plane. There are 2 distinct regurgitant jets - one larger central jet which is posteriorly directed and a smaller, eccentric jet. This was well-visualized in 2D and 3D images. There is at least moderate to severe regurgitation.  There is systolic flow reversal in the pulmonary vein.  8. AORTIC VALVE:  The aortic valve is a mechanical, tilting bileaflet valve. It appears to be functioning normally without pannus or obstruction. There is trace to mild physiologic regurgitation.  There were no vegetations or stenosis  9. TRICUSPID VALVE:  The tricuspid valve is normal in structure and function with Mild regurgitation.  It was difficult to doppler an adequate jet. There were no vegetations or stenosis  10.  PULMONIC VALVE:  The pulmonic valve is normal in structure and function with trivial regurgitation.  There were no vegetations or stenosis.   11. AORTIC ARCH, ASCENDING AND DESCENDING AORTA:  There was grade 3 Ron Parker et. Al, 1992) atherosclerosis of the proximal decending ascending aorta and distal aortic arch.  12. PULMONARY VEINS: Anomalous pulmonary venous return was not noted.  13. PERICARDIUM: The pericardium appeared normal and non-thickened.  There is no pericardial effusion.  IMPRESSION:   1. No LAA thrombus 2. Massively dilated LA with "smoke" 3. Normally functioning mechanical tilting bileaflet aortic valve  without obstruction 4. Moderate to more likely severe mitral regurgitation - systolic flow reversal noted in the pulmonary vein 5. There is a flail segment of the posterior leaflet, likely the P2 segment, with a large, central to  posteriorly directed regurgitant jet 6. No PFO - bowing of the IAS from left to right, suggesting high LA pressure 7. LVEF 65%  RECOMMENDATIONS:    1. There is likely severe mitral regurgitation with a flail P2 segment of the posterior leaflet and prolapse of a detatched cord. There is massive LA enlargement, which is likely due to long-standing volume overload of the left atrium and/or long-standing atrial fibrillation. The surface echo accurately estimated the RVSP to be >70 mmHg. I suspect this pulmonary hypertension may be fixed, in which case, repair of the valve at this point may not be beneficial. Right heart catheterization and/or trial with pulmonary vasodilators may be warranted or consider oxygen therapy.  Time Spent Directly with the Patient:  60 minutes   Pixie Casino, MD, [facility] Attending Cardiologist [facility]  10/11/2014, 11:37 AM

## 2014-10-11 NOTE — Telephone Encounter (Signed)
Closed encounter °

## 2014-10-11 NOTE — Discharge Instructions (Signed)
Transesophageal Echocardiogram °Transesophageal echocardiography (TEE) is a special type of test that produces images of the heart by using sound waves (echocardiogram). This type of echocardiography can obtain better images of the heart than standard echocardiography. TEE is done by passing a flexible tube down the esophagus. The heart is located in front of the esophagus. Because the heart and esophagus are close to one another, your health care provider can take very clear, detailed pictures of the heart via ultrasound waves. °TEE may be done: °· If your health care provider needs more information based on standard echocardiography findings. °· If you had a stroke. This might have happened because a clot formed in your heart. TEE can visualize different areas of the heart and check for clots. °· To check valve anatomy and function. °· To check for infection on the inside of your heart (endocarditis). °· To evaluate the dividing wall (septum) of the heart and presence of a hole that did not close after birth (patent foramen ovale or atrial septal defect). °· To help diagnose a tear in the wall of the aorta (aortic dissection). °· During cardiac valve surgery. This allows the surgeon to assess the valve repair before closing the chest. °· During a variety of other cardiac procedures to guide positioning of catheters. °· Sometimes before a cardioversion, which is a shock to convert heart rhythm back to normal. °LET YOUR HEALTH CARE PROVIDER KNOW ABOUT:  °· Any allergies you have. °· All medicines you are taking, including vitamins, herbs, eye drops, creams, and over-the-counter medicines. °· Previous problems you or members of your family have had with the use of anesthetics. °· Any blood disorders you have. °· Previous surgeries you have had. °· Medical conditions you have. °· Swallowing difficulties. °· An esophageal obstruction. °RISKS AND COMPLICATIONS  °Generally, TEE is a safe procedure. However, as with any  procedure, complications can occur. Possible complications include an esophageal tear (rupture). °BEFORE THE PROCEDURE  °· Do not eat or drink for 6 hours before the procedure or as directed by your health care provider. °· Arrange for someone to drive you home after the procedure. Do not drive yourself home. During the procedure, you will be given medicines that can continue to make you feel drowsy and can impair your reflexes. °· An IV access tube will be started in the arm. °PROCEDURE  °· A medicine to help you relax (sedative) will be given through the IV access tube. °· A medicine may be sprayed or gargled to numb the back of the throat. °· Your blood pressure, heart rate, and breathing (vital signs) will be monitored during the procedure. °· The TEE probe is a long, flexible tube. The tip of the probe is placed into the back of the mouth, and you will be asked to swallow. This helps to pass the tip of the probe into the esophagus. Once the tip of the probe is in the correct area, your health care provider can take pictures of the heart. °· TEE is usually not a painful procedure. You may feel the probe press against the back of the throat. The probe does not enter the trachea and does not affect your breathing. °AFTER THE PROCEDURE  °· You will be in bed, resting, until you have fully returned to consciousness. °· When you first awaken, your throat may feel slightly sore and will probably still feel numb. This will improve slowly over time. °· You will not be allowed to eat or drink until it   is clear that the numbness has improved.  Once you have been able to drink, urinate, and sit on the edge of the bed without feeling sick to your stomach (nausea) or dizzy, you may be cleared to go home.  You should have a friend or family member with you for the next 24 hours after your procedure. Document Released: 11/08/2002 Document Revised: 08/23/2013 Document Reviewed: 02/17/2013 [facility] Patient Information  2015 [place], [place]. This information is not intended to replace advice given to you by your health care provider. Make sure you discuss any questions you have with your health care provider. Conscious Sedation, Adult, Care After Refer to this sheet in the next few weeks. These instructions provide you with information on caring for yourself after your procedure. Your health care provider may also give you more specific instructions. Your treatment has been planned according to current medical practices, but problems sometimes occur. Call your health care provider if you have any problems or questions after your procedure. WHAT TO EXPECT AFTER THE PROCEDURE  After your procedure:  You may feel sleepy, clumsy, and have poor balance for several hours.  Vomiting may occur if you eat too soon after the procedure. HOME CARE INSTRUCTIONS  Do not participate in any activities where you could become injured for at least 24 hours. Do not:  Drive.  Swim.  Ride a bicycle.  Operate heavy machinery.  Cook.  Use power tools.  Climb ladders.  Work from a high place.  Do not make important decisions or sign legal documents until you are improved.  If you vomit, drink water, juice, or soup when you can drink without vomiting. Make sure you have little or no nausea before eating solid foods.  Only take over-the-counter or prescription medicines for pain, discomfort, or fever as directed by your health care provider.  Make sure you and your family fully understand everything about the medicines given to you, including what side effects may occur.  You should not drink alcohol, take sleeping pills, or take medicines that cause drowsiness for at least 24 hours.  If you smoke, do not smoke without supervision.  If you are feeling better, you may resume normal activities 24 hours after you were sedated.  Keep all appointments with your health care provider. SEEK MEDICAL CARE IF:  Your skin is  pale or bluish in color.  You continue to feel nauseous or vomit.  Your pain is getting worse and is not helped by medicine.  You have bleeding or swelling.  You are still sleepy or feeling clumsy after 24 hours. SEEK IMMEDIATE MEDICAL CARE IF:  You develop a rash.  You have difficulty breathing.  You develop any type of allergic problem.  You have a fever. MAKE SURE YOU:  Understand these instructions.  Will watch your condition.  Will get help right away if you are not doing well or get worse. Document Released: 06/08/2013 Document Reviewed: 06/08/2013 [facility] Patient Information 2015 [place], [place]. This information is not intended to replace advice given to you by your health care provider. Make sure you discuss any questions you have with your health care provider.

## 2014-10-11 NOTE — Progress Notes (Signed)
  Echocardiogram Echocardiogram Transesophageal has been performed.  Philipp Deputy 10/11/2014, 12:50 PM

## 2014-10-11 NOTE — Progress Notes (Signed)
Patient discharged from Endoscopy. Patient informed not to drive for 24 hours. Upon taking the patient to the car, patient was upset at not being able to drive. Tech informed patient he was not able to drive for 24 hours. Patient sat in passenger seat and informed tech "that is what you say". Patient and wife left parking lot with wife driving slowly.

## 2014-10-12 ENCOUNTER — Other Ambulatory Visit: Payer: Self-pay | Admitting: Cardiology

## 2014-10-12 ENCOUNTER — Encounter (HOSPITAL_COMMUNITY): Payer: Self-pay | Admitting: Internal Medicine

## 2014-10-13 ENCOUNTER — Ambulatory Visit (INDEPENDENT_AMBULATORY_CARE_PROVIDER_SITE_OTHER): Payer: Medicare HMO | Admitting: *Deleted

## 2014-10-13 DIAGNOSIS — Z954 Presence of other heart-valve replacement: Secondary | ICD-10-CM

## 2014-10-13 DIAGNOSIS — Z952 Presence of prosthetic heart valve: Secondary | ICD-10-CM

## 2014-10-13 DIAGNOSIS — Z5181 Encounter for therapeutic drug level monitoring: Secondary | ICD-10-CM

## 2014-10-13 DIAGNOSIS — I482 Chronic atrial fibrillation, unspecified: Secondary | ICD-10-CM

## 2014-10-13 DIAGNOSIS — I4891 Unspecified atrial fibrillation: Secondary | ICD-10-CM

## 2014-10-13 LAB — POCT INR: INR: 2.6

## 2014-10-17 ENCOUNTER — Encounter (HOSPITAL_COMMUNITY): Payer: Self-pay | Admitting: Internal Medicine

## 2014-10-17 ENCOUNTER — Ambulatory Visit: Payer: Medicare HMO | Admitting: Cardiology

## 2014-10-27 ENCOUNTER — Ambulatory Visit (INDEPENDENT_AMBULATORY_CARE_PROVIDER_SITE_OTHER): Payer: Medicare HMO | Admitting: Pharmacist

## 2014-10-27 ENCOUNTER — Telehealth: Payer: Self-pay | Admitting: Cardiology

## 2014-10-27 DIAGNOSIS — Z954 Presence of other heart-valve replacement: Secondary | ICD-10-CM

## 2014-10-27 DIAGNOSIS — I4891 Unspecified atrial fibrillation: Secondary | ICD-10-CM

## 2014-10-27 DIAGNOSIS — Z952 Presence of prosthetic heart valve: Secondary | ICD-10-CM

## 2014-10-27 DIAGNOSIS — I482 Chronic atrial fibrillation, unspecified: Secondary | ICD-10-CM

## 2014-10-27 DIAGNOSIS — Z5181 Encounter for therapeutic drug level monitoring: Secondary | ICD-10-CM

## 2014-10-27 LAB — POCT INR: INR: 2.6

## 2014-10-27 NOTE — Telephone Encounter (Signed)
Unable to help patient get the number needed. Pt needed to speak with [facility] PT about financial situation.

## 2014-10-27 NOTE — Telephone Encounter (Signed)
Pt says he needs the name of the division at [facility] where he is suppose to go. He needs the name and phone number please.Please call today if possible.

## 2014-11-07 ENCOUNTER — Telehealth: Payer: Self-pay | Admitting: Cardiology

## 2014-11-07 NOTE — Telephone Encounter (Signed)
Patient states he is very anxious and really needs to know what recommendation Dr. [name] has for him regarding referral to a Cardiovascular surgeon. Routed to Dr. [name].

## 2014-11-07 NOTE — Telephone Encounter (Signed)
Pt called in stating that Dr. [name] was going to refer him to Cardiovascular surgeon and he would like to know who that was because he has not receieved a call from that office yet. Please f/u with   Thanks

## 2014-11-08 ENCOUNTER — Other Ambulatory Visit: Payer: Self-pay | Admitting: *Deleted

## 2014-11-08 DIAGNOSIS — I359 Nonrheumatic aortic valve disorder, unspecified: Secondary | ICD-10-CM

## 2014-11-13 ENCOUNTER — Institutional Professional Consult (permissible substitution) (INDEPENDENT_AMBULATORY_CARE_PROVIDER_SITE_OTHER): Payer: Medicare HMO | Admitting: Thoracic Surgery (Cardiothoracic Vascular Surgery)

## 2014-11-13 ENCOUNTER — Encounter: Payer: Self-pay | Admitting: Thoracic Surgery (Cardiothoracic Vascular Surgery)

## 2014-11-13 ENCOUNTER — Other Ambulatory Visit: Payer: Self-pay | Admitting: *Deleted

## 2014-11-13 VITALS — BP 110/71 | HR 78 | Resp 20 | Ht 61.0 in | Wt 244.0 lb

## 2014-11-13 DIAGNOSIS — I7100 Dissection of unspecified site of aorta: Secondary | ICD-10-CM

## 2014-11-13 DIAGNOSIS — I340 Nonrheumatic mitral (valve) insufficiency: Secondary | ICD-10-CM

## 2014-11-13 DIAGNOSIS — Z954 Presence of other heart-valve replacement: Secondary | ICD-10-CM

## 2014-11-13 DIAGNOSIS — Z952 Presence of prosthetic heart valve: Secondary | ICD-10-CM

## 2014-11-13 LAB — CREATININE, SERUM: CREATININE: 0.83 mg/dL (ref 0.50–1.35)

## 2014-11-13 LAB — BUN: BUN: 16 mg/dL (ref 6–23)

## 2014-11-13 NOTE — Patient Instructions (Signed)
  Endocarditis is a potentially serious infection of heart valves or inside lining of the heart.  It occurs more commonly in patients with diseased heart valves (such as patient's with aortic or mitral valve disease) and in patients who have undergone heart valve repair or replacement.  Certain surgical and dental procedures may put you at risk, such as dental cleaning, other dental procedures, or any surgery involving the respiratory, urinary, gastrointestinal tract, gallbladder or prostate gland.   To minimize your chances for develooping endocarditis, maintain good oral health and seek prompt medical attention for any infections involving the mouth, teeth, gums, skin or urinary tract.  Always notify your doctor or dentist about your underlying heart valve condition before having any invasive procedures. You will need to take antibiotics before certain procedures.    Schedule CT angiogram, pulmonary function tests, and dental consult as soon as practical  Schedule heart catheterization through [facility] (Dr [name]'s office).  This may require temporary treatment using blood thinners (like Lovenox) while you are off of coumadin

## 2014-11-13 NOTE — Progress Notes (Signed)
[address]        [place] [address]             [phone]     CARDIOTHORACIC SURGERY CONSULTATION REPORT  Referring Provider is Lance Breeding, MD PCP is Lance Dials, PA-C  Chief Complaint  Patient presents with  . Mitral Regurgitation    Surgical eval, TEE 10/11/14  . 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 At the time the patient lived in [place], where the diagnosis was made. The patient traveled to [facility] where he underwent urgent surgery by Dr. Bl[facility].  According to the patient his aortic valve and entire ascending thoracic aorta was replaced using a 25 mm St. Jude mechanical valve conduit. Single-vessel coronary artery bypass grafting to the right coronary artery was performed at the time because of acute myocardial infarction.  He does not recall being told whether or not he had a bicuspid aortic valve or evidence for other valvular heart disease at the time.  He recovered from the surgery uneventfully. He does not recall ever being told about any residual chronic aortic dissection or false aneurysm.  Over the past 25 years the patient has developed progressive symptoms of exertional shortness of breath, orthopnea, and severe bilateral lower extremity edema consistent with chronic diastolic congestive heart failure.  He developed chronic persistent atrial fibrillation for which he has  been managed on medical therapy for at least 15 years. He suffered a minor stroke and another brief transient ischemic attack in the distant past, and both were associated with times when Coumadin therapy was subtherapeutic.   The patient and his wife moved to [place] in 2008, and he has been followed for the last several years by Dr. Percival Cook.  Recent follow-up transthoracic echocardiogram demonstrated the presence of mitral valve prolapse with possible ruptured chordae tendineae, and subsequent transesophageal echocardiogram confirmed similar findings with likely severe mitral regurgitation. The patient was referred for surgical consultation.  The patient is married and lives locally in [place] with his wife. He describes stable symptoms of exertional shortness of breath consistent with chronic diastolic congestive heart failure, New York Heart Association function class III.  He states that he has been limited by exertional shortness of breath for many years. His symptoms have not progressed recently, but the patient remains significantly limited in that he gets short of breath and tired with relatively mild activity.  He denies any symptoms of resting shortness of breath, PND, chest tightness or chest pressure, dizzy spells, or syncope.  His physical activity and mobility are also severely limited by chronic pain in his back related to spinal stenosis.  The patient previously worked as an Dietitian, but he has been on full disability for several years because severe chronic back pain and inability to stand on his feet.  The patient developed severe chronic sleep apnea for which he has been CPAP-dependent for years.  He has severe chronic bilateral lower extremity edema with occasional spontaneous  bleeding from ruptured varicose veins, recurrent cellulitis, and chronic relapsing spontaneous transcutaneous drainage of serous fluid.  He was hospitalized in August 2015 with fevers and sepsis felt related  to an acute exacerbation of cellulitis in his leg. Symptoms resolved fairly quickly with antibiotic therapy and blood cultures were negative at that time.     Past Medical History  Diagnosis Date  . Diabetes mellitus   . Hyperlipidemia   . Hypertension   . Arthritis   . Leg pain 06/28/2010  . Hiatal hernia   . Atrial fibrillation     chronic persistent  . Reflux 11/06/09  . Myocardial infarction age 72  . Varicose veins   . Obesity   . Peripheral vascular disease   . Gout   . CAD (coronary artery disease)     Old scar inferior wall myoview, 10/2009 EF 52%  . Chronic LBP 10/08/2011  . Impaired glucose tolerance 10/08/2011  . Erectile dysfunction 10/08/2011  . Testicular cancer   . Bell's palsy   . TIA (transient ischemic attack)     age 87  . CVA (cerebral infarction)     54yo  . OSA (obstructive sleep apnea)     CPAP  . History of colon polyps   . Anemia   . Hypogonadism male 04/08/2012  . Aortic aneurysm   . Aortic aneurysm and dissection   . 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     1988 - Dr [facility] Cook at [facility] in [place], [place]  . Ascending aortic dissection 07/14/2008    Localized dissection of ascending aorta noted on CTA in 2009 and stable on CTA in 2011  . DIABETES MELLITUS, TYPE II 11/01/2009    Qualifier: Diagnosis of  By: Lance Amen MD, [facility]    . Severe mitral regurgitation 10/11/2014  . Morbid obesity 04/02/2014    Past Surgical History  Procedure Laterality Date  . Laser ablation  03/06/2010    left leg  . Cardiac valve replacement    . Cholecystectomy  2011  . Tonsillectomy  1967  . Otoplasty      bilateral  . Orchiectomy  age 73    testicular cancer  . Tee without cardioversion N/A 10/11/2014    Procedure: TRANSESOPHAGEAL ECHOCARDIOGRAM (TEE);  Surgeon: Lance Casino, MD;  Location: [facility] ENDOSCOPY;  Service: Cardiovascular;  Laterality: N/ADeneen Cook procedure  1988    25 mm St Jude mechanical valve  conduit - Dr [facility] Cook at [facility] in [place], [place]    Family History  Problem Relation Age of Onset  . Heart disease Mother   . Throat cancer Mother   . Diabetes Father   . Stroke Other   . Hypertension Other   . Cancer Maternal Aunt     lung, brain  . Cancer Maternal Uncle     brain aneurysm    History   Social History  . Marital Status: Married    Spouse Name: N/A  . Number of Children: 0  . Years of Education: N/A   Occupational History  . Disabled Optician    Social History Main Topics  . Smoking status: Current Some Day Smoker    Types: Cigars  . Smokeless tobacco: Never Used     Comment: about 3 yearly  . Alcohol Use: Yes     Comment: social  . Drug Use: No  . Sexual Activity: Not on file   Other Topics Concern  . Not on file  Social History Narrative    Current Outpatient Prescriptions  Medication Sig Dispense Refill  . allopurinol (ZYLOPRIM) 300 MG tablet Take 300 mg by mouth daily.    Marland Kitchen atenolol (TENORMIN) 100 MG tablet Take 100 mg by mouth 2 (two) times daily.    Marland Kitchen b complex vitamins tablet Take 1 tablet by mouth daily.      . Calcium-Magnesium-Zinc (CAL-MAG-ZINC PO) Take by mouth daily.      Marland Kitchen CARTIA XT 120 MG 24 hr capsule Take 120 mg by mouth daily.     . digoxin (LANOXIN) 0.25 MG tablet Take 0.25 mg by mouth daily.    . furosemide (LASIX) 40 MG tablet Take 20-40 mg by mouth daily as needed for fluid.    . methocarbamol (ROBAXIN) 500 MG tablet Take 500 mg by mouth 3 (three) times daily.     . ONE TOUCH ULTRA TEST test strip     . oxyCODONE (OXY IR/ROXICODONE) 5 MG immediate release tablet Take 5 mg by mouth as needed.     . potassium chloride (K-DUR) 10 MEQ tablet Take 10 mEq by mouth daily.    . temazepam (RESTORIL) 15 MG capsule Take 15 mg by mouth at bedtime as needed for sleep.    Marland Kitchen warfarin (COUMADIN) 5 MG tablet Take 1 to 1.5 tablets by mouth daily as directed by coumadin clinic 120 tablet 1  . warfarin (COUMADIN) 7.5 MG  tablet Take 7.5 mg by mouth See admin instructions. Take on Mondays, Wednesdays, Fridays, and Sundays     No current facility-administered medications for this visit.    Allergies  Allergen Reactions  . Penicillins     REACTION: Hives      Review of Systems:   General:  good appetite, stable energy, + weight gain, no weight loss, no fever  Cardiac:  no chest pain with exertion, no chest pain at rest, + SOB with exertion, no resting SOB, no PND, + orthopnea, no palpitations, + arrhythmia, + atrial fibrillation, + LE edema, no dizzy spells, no syncope  Respiratory:  + chronic exertional shortness of breath, no home oxygen, no productive cough, no dry cough, no bronchitis, no wheezing, no hemoptysis, no asthma, no pain with inspiration or cough, + sleep apnea, + CPAP at night  GI:   no difficulty swallowing, no reflux, no frequent heartburn, + hiatal hernia, no abdominal pain, no constipation, + chronic diarrhea, no hematochezia, no hematemesis, no melena  GU:   no dysuria,  no frequency, no urinary tract infection, no hematuria, no enlarged prostate, no kidney stones, no kidney disease  Vascular:  no pain suggestive of claudication, no pain in feet, no leg cramps, + varicose veins, no DVT, no non-healing foot ulcer, + severe chronic venous insufficiency with chronic lymphedema, relapsing cellulitis and spontaneous bleeding from ruptured varicose veins  Neuro:   + stroke, + TIA's, NO seizures, NO headaches, NO temporary blindness one eye,  NO slurred speech, NO peripheral neuropathy, + chronic pain, no instability of gait, no memory/cognitive dysfunction  Musculoskeletal: + arthritis, no joint swelling, no myalgias, no difficulty walking, decreased mobility   Skin:   + rash, no itching, + skin infections, no pressure sores or ulcerations  Psych:   no anxiety, no depression, no nervousness, no unusual recent stress  Eyes:   no blurry vision, no floaters, no recent vision changes, + wears glasses  or contacts  ENT:   no hearing loss, no loose or painful teeth, no dentures, last saw dentist > 3  years ago  Hematologic:  + easy bruising, no abnormal bleeding, no clotting disorder, no frequent epistaxis  Endocrine:  + diabetes, routinely checks CBG's at home, Hgb A1c reportedly < 6.0     Physical Exam:   BP 110/71 mmHg  Pulse 78  Resp 20  Ht 5\' 1"  (1.549 m)  Wt 244 lb (110.678 kg)  BMI 46.13 kg/m2  SpO2 95%  General:  Morbidly obese, chronically ill-appearing  HEENT:  Unremarkable   Neck:   no JVD, no bruits, no adenopathy   Chest:   clear to auscultation, symmetrical breath sounds, no wheezes, no rhonchi   CV:   RRR, mechanical heart sounds, prominent IV/VI systolic murmur heard best LLSB  Abdomen:  soft, non-tender, no masses   Extremities:  warm, well-perfused, pulses not palpable, + severe bilateral LE edema with chronic skin changes and spontaneous drainage of serous fluid, no active cellulitis  Rectal/GU  Deferred  Neuro:   Grossly non-focal and symmetrical throughout  Skin:   Clean and dry, no rashes, no breakdown   Diagnostic Tests:  CT ANGIOGRAPHY CHEST WITH CONTRAST  Technique: Multidetector CT imaging of the chest was performed using the standard protocol during bolus administration of intravenous contrast. Multiplanar CT image reconstructions including MIPs were obtained to evaluate the vascular anatomy.  Contrast: 100 ml Omnipaque 350.  Comparison: Plain films chest 10/19/2009 and CT chest 07/14/2008.  Findings: No pulmonary embolus is identified. Aortic valve replacement is noted. Prominence of the main pulmonary artery 3.9 cm is unchanged. Also unchanged is the size of the aorta at the level of the RCA graft origination site which measures 4.1 x 3.6 cm. Short segment of dissection along the ascending aorta at its root appears unchanged.  There is no pleural or pericardial effusion. Cardiomegaly is noted. The patient has some small  mediastinal lymph nodes which are unchanged including a right paratracheal node on image 40 measuring 1 cm.  Lungs demonstrate some dependent atelectatic change. A few small calcified nodules are unchanged. No consolidative process, noncalcified nodule or mass is identified.  Incidentally imaged upper abdomen is unremarkable. No focal bony abnormality.  Review of the MIP images confirms the above findings.  IMPRESSION:  1. Negative for pulmonary embolus or acute abnormality. 2. Unchanged short segment of dissection of the ascending aorta. 3. Cardiomegaly.  Provider: Jeanell Sparrow     Transthoracic Echocardiography  Patient:  Lance Cook, Lance Cook MR #:    [id number] Study Date: 09/11/2014 Gender:   M Age:    64 Height:   154.9 cm Weight:   104.8 kg BSA:    2.19 m^2 Pt. Status: Room:  ATTENDING  Jeneen Rinks [name] ORDERING   Jeneen Rinks [name] REFERRING  [facility] Cook PERFORMING  [facility], Outpatient SONOGRAPHER [facility], RDCS  cc:  ------------------------------------------------------------------- LV EF: 55% -  60%  ------------------------------------------------------------------- Indications:   Atrial Fibrillation (I48.91).  ------------------------------------------------------------------- History:  PMH: Thoracic Aorta Repair with Aortic Valve Replacement, Obstructive Sleep Apnea with CPAP, Testicular Cancer, Bells Palsy, Edema, History of Substance Abuse. Atrial fibrillation. Coronary artery disease. Stroke. Transient ischemic attack. PMH:  Myocardial infarction. Risk factors: Hypertension. Diabetes mellitus. Dyslipidemia.  ------------------------------------------------------------------- Study Conclusions  - Left ventricle: The cavity size was normal. Wall thickness was increased in a pattern of mild LVH. Systolic function was normal. The estimated ejection fraction was in the range of 55% to  60%. Wall motion was normal; there were no regional wall motion abnormalities. Doppler parameters are consistent with restrictive physiology, indicative of decreased left ventricular diastolic compliance and/or  increased left atrial pressure. - Aortic valve: There was a mechanical aortic valve. There was no significant regurgitation. Mean gradient across the prosthetic valve is not significantly elevated. Mean gradient (S): 19 mm Hg. - Mitral valve: There appears to be a ruptured secondary chord that can be seen prolapsing across the mitral valve. There was eccentric, posteriorly-directed MR. It was at least moderate, cannot rule out severe. - Left atrium: The atrium was severely dilated. - Right ventricle: The cavity size was normal. Systolic function was normal. - Right atrium: The atrium was mildly dilated. - Atrial septum: The septum bowed from left to right, consistent with increased left atrial pressure. - Tricuspid valve: Peak RV-RA gradient (S): 58 mm Hg. - Pulmonary arteries: PA peak pressure: 73 mm Hg (S). - Systemic veins: IVC measured 2.7 cm with < 50% respirophasic variation, suggesting RA pressure 15 mmHg.  Impressions:  - The patient appeared to be in atrial fibrillation. Normal LV size with mild LV hypertrophy, EF 55-60%. Restrictive diastolic function. Mechanical aortic valve appeared to function normally. There appeared to be a ruptured probably secondary chord that prolapsed across the mitral valve. There was eccentric, posteriorly-directed MR that was at least moderate, cannot rule out severe. Severe pulmonary hypertension. Normal RV size and systolic function. Suggest TEE to evaluate mitral valve.  Transthoracic echocardiography. M-mode, complete 2D, spectral Doppler, and color Doppler. Birthdate: Patient birthdate: [birth year]. Age: Patient is 54 yr old. Sex: Gender: male. BMI: 43.6 kg/m^2. Blood pressure:    138/85 Patient status: Outpatient. Study date: Study date: 09/11/2014. Study time: 11:12 AM. Location: Echo laboratory.  -------------------------------------------------------------------  ------------------------------------------------------------------- Left ventricle: The cavity size was normal. Wall thickness was increased in a pattern of mild LVH. Systolic function was normal. The estimated ejection fraction was in the range of 55% to 60%. Wall motion was normal; there were no regional wall motion abnormalities. Doppler parameters are consistent with restrictive physiology, indicative of decreased left ventricular diastolic compliance and/or increased left atrial pressure.  ------------------------------------------------------------------- Aortic valve: There was a mechanical aortic valve. Doppler: There was no significant regurgitation. Mean gradient across the prosthetic valve is not significantly elevated.  VTI ratio of LVOT to aortic valve: 0.26. Indexed valve area (VTI): 0.45 cm^2/m^2. Indexed valve area (Vmax): 0.35 cm^2/m^2. Mean velocity ratio of LVOT to aortic valve: 0.21. Indexed valve area (Vmean): 0.36 cm^2/m^2.  Mean gradient (S): 19 mm Hg. Peak gradient (S): 36 mm Hg.  ------------------------------------------------------------------- Aorta: Aortic root: The aortic root was normal in size. Ascending aorta: The ascending aorta was normal in size.  ------------------------------------------------------------------- Mitral valve: There appears to be a ruptured secondary chord that can be seen prolapsing across the mitral valve. There was eccentric, posteriorly-directed MR. It was at least moderate, cannot rule out severe. Mildly calcified leaflets . Doppler: There was no evidence for stenosis.   Peak gradient (D): 7 mm Hg.  ------------------------------------------------------------------- Left atrium: LA Volume/BSA= 145.3 ml/m2. The  atrium was severely dilated.  ------------------------------------------------------------------- Atrial septum: The septum bowed from left to right, consistent with increased left atrial pressure.  ------------------------------------------------------------------- Right ventricle: The cavity size was normal. Systolic function was normal.  ------------------------------------------------------------------- Pulmonic valve:  Structurally normal valve.  Cusp separation was normal. Doppler: Transvalvular velocity was within the normal range. There was no regurgitation.  ------------------------------------------------------------------- Tricuspid valve:  Doppler: There was mild regurgitation.  ------------------------------------------------------------------- Right atrium: The atrium was mildly dilated.  ------------------------------------------------------------------- Systemic veins: IVC measured 2.7 cm with < 50% respirophasic variation, suggesting RA pressure 15 mmHg.  ------------------------------------------------------------------- Measurements  Left  ventricle              Value     Reference LV ID, ED, PLAX chordal      (H)   52.4 mm    43 - 52 LV ID, ES, PLAX chordal          31.7 mm    23 - 38 LV fx shortening, PLAX chordal      40  %    >=29 LV PW thickness, ED            14.9 mm    --------- Stroke volume, 2D             55  ml    --------- Stroke volume/bsa, 2D           25  ml/m^2  --------- LV e&', lateral              7.71 cm/s   --------- LV E/e&', lateral             17.25     --------- LV e&', medial               8.74 cm/s   --------- LV E/e&', medial              15.22     --------- LV e&', average              8.23 cm/s   --------- LV E/e&',  average             16.17     ---------  Ventricular septum            Value     Reference IVS thickness, ED             11.5 mm    ---------  LVOT                   Value     Reference LVOT ID, S                22  mm    --------- LVOT area                 3.8  cm^2   --------- LVOT peak velocity, S           61.1 cm/s   --------- LVOT mean velocity, S           40.7 cm/s   --------- LVOT VTI, S                14.6 cm    ---------  Aortic valve               Value     Reference Aortic valve mean velocity, S       198  cm/s   --------- Aortic valve VTI, S            56.1 cm    --------- Aortic mean gradient, S          19  mm Hg  --------- Aortic peak gradient, S          36  mm Hg  --------- VTI ratio, LVOT/AV            0.26      --------- Aortic valve area/bsa, VTI        0.45 cm^2/m^2 --------- Aortic valve area/bsa, peak        0.35 cm^2/m^2 --------- velocity Velocity ratio, mean,  LVOT/AV       0.21      --------- Aortic valve area/bsa, mean        0.36 cm^2/m^2 --------- velocity  Aorta                   Value     Reference Aortic root ID, ED            26  mm    ---------  Left atrium                Value     Reference LA ID, A-P, ES              59  mm    --------- LA ID/bsa, A-P          (H)   2.7  cm/m^2  <=2.2 LA volume, S               292  ml    --------- LA volume/bsa, S             133.6 ml/m^2  --------- LA volume, ES, 1-p A4C          219  ml    --------- LA volume/bsa, ES, 1-p A4C        100.2 ml/m^2   --------- LA volume, ES, 1-p A2C          368  ml    --------- LA volume/bsa, ES, 1-p A2C        168.3 ml/m^2  ---------  Mitral valve               Value     Reference Mitral E-wave peak velocity        133  cm/s   --------- Mitral A-wave peak velocity        30.7 cm/s   --------- Mitral deceleration time         204  ms    150 - 230 Mitral peak gradient, D          7   mm Hg  --------- Mitral E/A ratio, peak          4.3      ---------  Pulmonary arteries            Value     Reference PA pressure, S, DP        (H)   73  mm Hg  <=30  Tricuspid valve              Value     Reference Tricuspid regurg peak velocity      382  cm/s   --------- Tricuspid peak RV-RA gradient       58  mm Hg  ---------  Right ventricle              Value     Reference RV s&', lateral, S             12  cm/s   ---------  Legend: (L) and (H) mark values outside specified reference range.  ------------------------------------------------------------------- Prepared and Electronically Authenticated by  Loralie Champagne, M.D. 2016-01-11T15:41:03    Transesophageal Echocardiography  Patient:  Lance Cook, Lance Cook MR #:    [id number] Study Date: 10/11/2014 Gender:   M Age:    56 Height:   154.9 cm Weight:   110 kg BSA:    2.24 m^2 Pt. Status: Room:  SONOGRAPHER Florentina Jenny, [place]  Lyman Bishop MD [place]  MD ORDERING   Lyman Bishop MD PERFORMING  Lyman Bishop MD REFERRING  Lyman Bishop MD  cc:  ------------------------------------------------------------------- LV EF: 60% -  65%  ------------------------------------------------------------------- Indications:   Mitral regurgitation  424.0.  ------------------------------------------------------------------- History:  PMH:  Aortic valve disease.  ------------------------------------------------------------------- Study Conclusions  - Left ventricle: There was mild concentric hypertrophy. Systolic function was normal. The estimated ejection fraction was in the range of 60% to 65%. Wall motion was normal; there were no regional wall motion abnormalities. - Aortic valve: Mechanical bileaflet valve - no obstruction. Trace to mild physiologic AI. - Mitral valve: There is a flail leaflet, likely to the P2 scallop of the posterior leaflet. This prolapses through the valve plane. There is moderate to severe posteriorly directed regurgitation from a central jet and a small eccentric jet is also noted. - Left atrium: Massively dilated - smoke is present. No evidence of thrombus in the atrial cavity or appendage. - Pulmonary veins: No anomaly- systolic flow reversal noted in the RUPV. - Right atrium: No evidence of thrombus in the atrial cavity or appendage. - Atrial septum: Bows from left to right - no PFO by color doppler. - Pulmonic valve: No evidence of vegetation.  Impressions:  - Stable mechanical AVR without obstruction. Flail P2 cord with moderate to severe posteriorly directed MR, massive LAE, no LAA thrombus, significant LA smoke, normal LV function.  Diagnostic transesophageal echocardiography. 2D and color Doppler. Birthdate: Patient birthdate: 1961-06-05. Age: Patient is 54 yr old. Sex: Gender: male.  BMI: 45.8 kg/m^2. Blood pressure: 127/51 Patient status: Outpatient. Study date: Study date: 10/11/2014. Study time: 11:05 AM. Location: Endoscopy.  -------------------------------------------------------------------  ------------------------------------------------------------------- Left ventricle: There was mild concentric hypertrophy. Systolic function was  normal. The estimated ejection fraction was in the range of 60% to 65%. Wall motion was normal; there were no regional wall motion abnormalities.  ------------------------------------------------------------------- Aortic valve: Mechanical bileaflet valve - no obstruction. Trace to mild physiologic AI.  ------------------------------------------------------------------- Aorta: The aorta was normal, not dilated, and non-diseased.  ------------------------------------------------------------------- Mitral valve: There is a flail leaflet, likely to the P2 scallop of the posterior leaflet. This prolapses through the valve plane. There is moderate to severe posteriorly directed regurgitation from a central jet and a small eccentric jet is also noted.  ------------------------------------------------------------------- Left atrium: Massively dilated - smoke is present. No evidence of thrombus in the atrial cavity or appendage.  ------------------------------------------------------------------- Atrial septum: Bows from left to right - no PFO by color doppler.  ------------------------------------------------------------------- Pulmonary veins: No anomaly- systolic flow reversal noted in the RUPV.  ------------------------------------------------------------------- Right ventricle: Poorly visualized.  ------------------------------------------------------------------- Pulmonic valve:  Structurally normal valve.  Cusp separation was normal. No evidence of vegetation.  ------------------------------------------------------------------- Tricuspid valve:  Doppler: There was mild regurgitation.  ------------------------------------------------------------------- Pulmonary artery:  The main pulmonary artery was normal-sized.  ------------------------------------------------------------------- Right atrium: The atrium was normal in size. No evidence of thrombus in the  atrial cavity or appendage.  ------------------------------------------------------------------- Pericardium: There was no pericardial effusion.  ------------------------------------------------------------------- Post procedure conclusions Ascending Aorta:  - The aorta was normal, not dilated, and non-diseased.  ------------------------------------------------------------------- Prepared and Electronically Authenticated by  Lyman Bishop MD 2016-02-10T18:19:57   Impression:  I have personally reviewed the patient's recent transthoracic and transesophageal echocardiograms. The patient has at least moderate mitral regurgitation, and the presence of systolic flow reversal in one of the pulmonary veins noted on recent TEE suggests the mitral regurgitation may be severe. The patient does have what appears to be a single ruptured primary chordae tendineae that  can be seen attached to the middle scallop of the posterior leaflet. However, there does not appear to be a corresponding flail leaflet in this region, and although there is mild bileaflet prolapse noted on TEE, for the most part the mitral regurgitation appears to be caused by type I dysfunction with significant annular dilatation. The patient's mechanical aortic valve appears to be functioning normally, and left ventricular size and systolic function remained normal. There is severe left atrial enlargement.  The patient has long-standing history of exertional shortness of breath and fatigue that is likely multifactorial but clearly consistent with chronic diastolic congestive heart failure, New York Heart Association functional class III.  Under the circumstances it might be reasonable to consider elective mitral valve repair, but risks associated with surgery would be unquestionably very high because of his numerous comorbid medical problems.  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 Single-vessel coronary artery bypass grafting was performed to the right coronary artery at that time, presumably because of involvement of the right coronary artery by his aortic dissection.  He has never had a follow-up cardiac catheterization but noninvasive stress imaging has reportedly demonstrated scarring involving the inferior wall.  CT angiograms of the chest performed in 2009 and 2011 locally in [place] demonstrate what may be a small residual dissection involving the ascending thoracic aorta.  His aorta has not been formally imaged recently.     Plan:  I have personally reviewed the patient's recent transthoracic and transesophageal echocardiogram images with the patient and his wife in the office today.  The potential benefits of elective mitral valve repair were discussed.  Issues related to the patient's numerous chronic comorbid medical problems were discussed as well as how they might impact any attempt at surgical intervention.  The need for further diagnostic testing to better characterize the relative risks and benefits of elective mitral valve repair was emphasized. The patient will undergo CT angiography to follow-up the presence of chronic aortic dissection. He will need left and right heart catheterization to ascertain the presence or absence of significant coronary artery disease and directly measure the severity of pulmonary hypertension.  This may require bridging therapy with Lovenox or heparin because of the patient's mechanical aortic valve prosthesis with history of long-term chronic persistent atrial fibrillation and history of TIA and stroke in the distant past.  Pulmonary function tests will be obtained.  The patient will be referred for dental service consultation because he has not seen a dentist in several years. The importance of dental hygiene and the need  for long-term antibiotic prophylaxis for all dental cleaning and related procedures was emphasized.  Finally, we will request that a copy of the patient's original operative note and hospital discharge summary be obtained from [facility] in [facility] where his surgery was originally performed 23 years ago.   I spent in excess of 90 minutes during the conduct of this office consultation and >50% of this time involved direct face-to-face encounter with the patient for counseling and/or coordination of their care.   Valentina Gu. Roxy Manns, MD 11/13/2014 6:44 PM

## 2014-11-14 ENCOUNTER — Encounter (HOSPITAL_COMMUNITY): Payer: Self-pay | Admitting: Dentistry

## 2014-11-14 ENCOUNTER — Ambulatory Visit (HOSPITAL_COMMUNITY): Payer: Self-pay | Admitting: Dentistry

## 2014-11-14 VITALS — BP 122/62 | HR 65 | Temp 98.1°F

## 2014-11-14 DIAGNOSIS — Z01818 Encounter for other preprocedural examination: Secondary | ICD-10-CM

## 2014-11-14 DIAGNOSIS — K0530 Chronic periodontitis, unspecified: Secondary | ICD-10-CM

## 2014-11-14 DIAGNOSIS — Z7901 Long term (current) use of anticoagulants: Secondary | ICD-10-CM

## 2014-11-14 DIAGNOSIS — M270 Developmental disorders of jaws: Secondary | ICD-10-CM

## 2014-11-14 DIAGNOSIS — Z952 Presence of prosthetic heart valve: Secondary | ICD-10-CM

## 2014-11-14 DIAGNOSIS — K0889 Other specified disorders of teeth and supporting structures: Secondary | ICD-10-CM

## 2014-11-14 DIAGNOSIS — K031 Abrasion of teeth: Secondary | ICD-10-CM

## 2014-11-14 DIAGNOSIS — M2632 Excessive spacing of fully erupted teeth: Secondary | ICD-10-CM

## 2014-11-14 DIAGNOSIS — IMO0002 Reserved for concepts with insufficient information to code with codable children: Secondary | ICD-10-CM

## 2014-11-14 DIAGNOSIS — M264 Malocclusion, unspecified: Secondary | ICD-10-CM

## 2014-11-14 DIAGNOSIS — K036 Deposits [accretions] on teeth: Secondary | ICD-10-CM

## 2014-11-14 DIAGNOSIS — I340 Nonrheumatic mitral (valve) insufficiency: Secondary | ICD-10-CM

## 2014-11-14 MED ORDER — CLINDAMYCIN HCL 300 MG PO CAPS: ORAL_CAPSULE | ORAL | Status: DC

## 2014-11-14 NOTE — Progress Notes (Signed)
DENTAL CONSULTATION  Date of Consultation:  11/14/2014 Patient Name:   Lance Cook Date of Birth:   1960/09/13 Medical Record Number: [id number]  VITALS: BP 122/62 mmHg  Pulse 65  Temp(Src) 98.1 F (36.7 C) (Oral)  CHIEF COMPLAINT: Patient referred by Dr. Roxy Manns for a pre-heart valve surgery dental consultation.  HPI: Lance Cook is a 54 year old male recently diagnosed with severe mitral regurgitation. Patient with anticipated mitral valve repair, Cox maze procedure, and coronary artery bypass graft procedure. Patient is now seen as part of a medically necessary pre-heart valve surgery dental protocol examination. Patient previously underwent a Bentall procedure and aortic valve replacement in 1988. Patient is on chronic anticoagulation.  The patient currently denies acute toothaches, swellings, or abscesses. Patient has not seen a dentist in "at least 5 years". This was in [place]. Patient did have a regular dental care and periodontal surgery and periodontal maintenance treatment previously. Patient has not seen a dentist since he has been in [place] for the past 5 years.  PROBLEM LIST: Patient Active Problem List   Diagnosis Date Noted  . 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 Severe mitral regurgitation 10/11/2014  . Cellulitis 04/02/2014  . SIRS (systemic inflammatory response syndrome) 04/02/2014  . Sepsis 04/02/2014  . Hypotension 04/02/2014  . Venous insufficiency of both lower extremities 04/02/2014  . Morbid obesity 04/02/2014  . Encounter for therapeutic drug monitoring 02/02/2014  . Mass of left side of neck 08/18/2012  . Head or neck swelling, mass, or lump 07/15/2012  . Long term (current) use of anticoagulants 07/15/2012  . Hypogonadism male 04/08/2012  . History of adenomatous polyp of colon 12/23/2011  . Testicular cancer   . TIA (transient ischemic attack)   . OSA (obstructive sleep apnea)   . Lumbar spinal stenosis  10/08/2011  . Chronic LBP 10/08/2011  . CVA (cerebral vascular accident) 10/08/2011  . Erectile dysfunction 10/08/2011  . Preventative health care 08/09/2011  . CAD (coronary artery disease)   . Varicose veins 05/01/2011  . SPINAL STENOSIS, LUMBAR 10/01/2010  . ULCER, LEG 04/29/2010  . DEGENERATIVE JOINT DISEASE, LUMBAR SPINE 04/29/2010  . DYSLIPIDEMIA 01/15/2010  . Chronic venous hypertension with ulcer 01/15/2010  . ESSENTIAL HYPERTENSION 12/14/2009  . MUSCLE CRAMPS 12/14/2009  . LEG PAIN, LEFT 11/29/2009  . DIABETES MELLITUS, TYPE II 11/01/2009  . GOUT 11/01/2009  . ANEMIA, IRON DEFICIENCY 11/01/2009  . MYOCARDIAL INFARCTION, HX OF 11/01/2009  . ATRIAL FIBRILLATION, CHRONIC 11/01/2009  . INTERNAL HEMORRHOIDS 11/01/2009  . PERIPHERAL EDEMA 11/01/2009  . H/O mechanical aortic valve replacement 11/01/2009  . Ascending aortic dissection 07/14/2008    PMH: Past Medical History  Diagnosis Date  . Diabetes mellitus   . Hyperlipidemia   . Hypertension   . Arthritis   . Leg pain 06/28/2010  . Hiatal hernia   . Atrial fibrillation     chronic persistent  . Reflux 11/06/09  . Myocardial infarction age 53  . Varicose veins   . Obesity   . Peripheral vascular disease   . Gout   . CAD (coronary artery disease)     Old scar inferior wall myoview, 10/2009 EF 52%  . Chronic LBP 10/08/2011  . Impaired glucose tolerance 10/08/2011  . Erectile dysfunction 10/08/2011  . Testicular cancer   . Bell's palsy   . TIA (transient ischemic attack)     age 49  . CVA (cerebral infarction)     54yo  . OSA (obstructive sleep  apnea)     CPAP  . History of colon polyps   . Anemia   . Hypogonadism male 04/08/2012  . Aortic aneurysm   . Aortic aneurysm and dissection   . 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     1988 - Dr Blase Mess at [facility] in [place], [place]  . Ascending aortic dissection 07/14/2008    Localized dissection of ascending aorta  noted on CTA in 2009 and stable on CTA in 2011  . DIABETES MELLITUS, TYPE II 11/01/2009    Qualifier: Diagnosis of  By: [name] MD, [name]    . Severe mitral regurgitation 10/11/2014  . Morbid obesity 04/02/2014    PSH: Past Surgical History  Procedure Laterality Date  . Laser ablation  03/06/2010    left leg  . Cardiac valve replacement      [id number]  . Cholecystectomy  2011  . Tonsillectomy  1967  . Otoplasty      bilateral  . Orchiectomy  age 55    testicular cancer  . Tee without cardioversion N/A 10/11/2014    Procedure: TRANSESOPHAGEAL ECHOCARDIOGRAM (TEE);  Surgeon: Pixie Casino, MD;  Location[facility] ENDOSCOPY;  Service: Cardiovascular;  Laterality: N/ADeneen Harts procedure  1988    25 mm St Jude mechanical valve conduit - Dr Blase Mess at [facility] in [place], [place]: Allergies  Allergen Reactions  . Penicillins     REACTION: Hives    MEDICATIONS: Current Outpatient Prescriptions  Medication Sig Dispense Refill  . allopurinol (ZYLOPRIM) 300 MG tablet Take 300 mg by mouth daily.    Marland Kitchen atenolol (TENORMIN) 100 MG tablet Take 100 mg by mouth 2 (two) times daily.    Marland Kitchen b complex vitamins tablet Take 1 tablet by mouth daily.      . Calcium-Magnesium-Zinc (CAL-MAG-ZINC PO) Take by mouth daily.      Marland Kitchen CARTIA XT 120 MG 24 hr capsule Take 120 mg by mouth daily.     . digoxin (LANOXIN) 0.25 MG tablet Take 0.25 mg by mouth daily.    . furosemide (LASIX) 40 MG tablet Take 20-40 mg by mouth daily as needed for fluid.    . methocarbamol (ROBAXIN) 500 MG tablet Take 500 mg by mouth 3 (three) times daily.     . ONE TOUCH ULTRA TEST test strip     . oxyCODONE (OXY IR/ROXICODONE) 5 MG immediate release tablet Take 5 mg by mouth as needed.     . potassium chloride (K-DUR) 10 MEQ tablet Take 10 mEq by mouth daily.    . temazepam (RESTORIL) 15 MG capsule Take 15 mg by mouth at bedtime as needed for sleep.    Marland Kitchen warfarin (COUMADIN) 5 MG tablet Take 1 to 1.5 tablets  by mouth daily as directed by coumadin clinic 120 tablet 1  . warfarin (COUMADIN) 7.5 MG tablet Take 7.5 mg by mouth See admin instructions. Take on Mondays, Wednesdays, Fridays, and Sundays     No current facility-administered medications for this visit.    LABS: Lab Results  Component Value Date   WBC 8.7 05/23/2014   HGB 14.3 05/23/2014   HCT 42.4 05/23/2014   MCV 87.8 05/23/2014   PLT 147* 05/23/2014      Component Value Date/Time   NA 139 05/23/2014 1414   K 4.1 05/23/2014 1414   CL 100 05/23/2014 1414   CO2 31 05/23/2014 1414   GLUCOSE 97 05/23/2014 1414   BUN  16 11/13/2014 1320   CREATININE 0.83 11/13/2014 1515   CREATININE 0.87 05/23/2014 1414   CALCIUM 9.4 05/23/2014 1414   GFRNONAA >90 05/23/2014 1414   GFRAA >90 05/23/2014 1414   Lab Results  Component Value Date   INR 2.6 10/27/2014   INR 2.6 10/13/2014   INR 4.1 09/29/2014   No results found for: PTT  SOCIAL HISTORY: History   Social History  . Marital Status: Married    Spouse Name: N/A  . Number of Children: 0  . Years of Education: N/A   Occupational History  . Disabled Optician    Social History Main Topics  . Smoking status: Current Some Day Smoker    Types: Cigars  . Smokeless tobacco: Never Used     Comment: about 3 yearly  . Alcohol Use: Yes     Comment: social  . Drug Use: No  . Sexual Activity: Not on file   Other Topics Concern  . Not on file   Social History Narrative    FAMILY HISTORY: Family History  Problem Relation Age of Onset  . Heart disease Mother   . Throat cancer Mother   . Diabetes Father   . Stroke Other   . Hypertension Other   . Cancer Maternal Aunt     lung, brain  . Cancer Maternal Uncle     brain aneurysm    REVIEW OF SYSTEMS: Reviewed with the patient and is included in dental record.  DENTAL HISTORY: CHIEF COMPLAINT: Patient referred by Dr. Roxy Manns for a pre-heart valve surgery dental consultation.  HPI: Edyn Qazi is a 54 year old male  recently diagnosed with severe mitral regurgitation. Patient with anticipated mitral valve repair, Cox maze procedure, and coronary artery bypass graft procedure. Patient is now seen as part of a medically necessary pre-heart valve surgery dental protocol examination. Patient previously underwent a Bentall procedure and aortic valve replacement in 1988. Patient is on chronic anticoagulation.  The patient currently denies acute toothaches, swellings, or abscesses. Patient has not seen a dentist in "at least 5 years". This was in [place]. Patient did have a regular dental care and periodontal surgery and periodontal maintenance treatment previously. Patient has not seen a dentist since he has been in [place] for the past 5 years.  DENTAL EXAMINATION: GENERAL: The patient is a short-statured, well-developed, obese male in no acute distress. HEAD AND NECK: There is no palpable submandibular lymphadenopathy. The patient denies acute TMJ symptoms. INTRAORAL EXAM: The patient has normal saliva. I do not see any evidence of oral abscess formation. The patient has bilateral mandibular lingual tori. The patient has a deep palatal vault. DENTITION: The patient is NOT missing any teeth. Multiple diastemas are noted. Maxillary and mandibular occlusal and incisal attrition are noted. PERIODONTAL: Patient has chronic periodontitis with plaque and calculus accumulations, selective areas of gingival recession, and tooth mobility as charted. DENTAL CARIES/SUBOPTIMAL RESTORATIONS: There are no obvious dental caries noted. Multiple abfraction/flexure lesions are noted. ENDODONTIC: The patient denies acute pulpitis symptoms. I do not see any evidence of periapical pathology. CROWN AND BRIDGE: There are no crown or bridge restorations. PROSTHODONTIC: No partial dentures. OCCLUSION: Patient has a poor occlusal scheme with end-to-end occlusion, areas of incisal and occlusal attrition,  and multiple diastemas. The  occlusion appears to be stable at this time, however.  RADIOGRAPHIC INTERPRETATION: An orthopantogram was taken and supplemented with a full series of dental radiographs. There are no missing teeth. There are multiple diastemas noted. There is incipient to  moderate bone loss.  There is no evidence of periapical pathology or radiolucency. Maxillary sinuses are noted to be well aerated. Trabecular bone pattern appears to be normal. There are radiopacities involving the lower teeth consistent with bilateral mandibular lingual tori.  ASSESSMENTS: 1. Severe mitral regurgitation 2. Status post Bentall procedure with aortic valve replacement 3. Chronic anticoagulation 4. Chronic periodontitis with bone loss 5. Plaque and calculus accumulations 6. Selective areas of gingival recession 7. Tooth mobility 8. No missing teeth 9. Multiple diastemas 10. Maxillary and mandibular incisal and occlusal attrition 11. Multiple abfraction/flexure lesions 12. Poor occlusal scheme but a stable occlusion 13. Need for antibiotic premedication prior to invasive dental procedures due to previous aortic valve replacement. 14. Chronic anticoagulation with the risk for bleeding with invasive dental procedures   PLAN/RECOMMENDATIONS: 1. I discussed the risks, benefits, and complications of various treatment options with the patient in relationship to his medical and dental conditions, anticipated mitral valve repair, previous aortic valve replacement, chronic anticoagulation, and risk for endocarditis. We discussed various treatment options to include no treatment, periodontal therapy, occlusal splint therapy, restoration of flexure lesions, and referral to a periodontist. The patient currently wishes to proceed with gross debridement procedure with antibiotic premedication prior to invasive dental procedures. This has been scheduled for 11/23/2014 at 1 PM. No anticipated adjustment of the patient's Coumadin therapy should  be needed at this time. I will review the INR scheduled for this Friday to see if Coumadin adjustment are needed. Patient will utilize antibiotic premedication with clindamycin 600 mg by mouth one hour prior to dental appointment. The patient will then follow-up with the general dentist of his choice for routine periodontal maintenance and possible referral for future periodontal therapy and occlusal splint therapy. Patient should be dentally cleared for heart valve surgery after initial periodontal therapy.   2. Discussion of findings with medical team and coordination of future medical and dental care as needed.  I spent in excess of  120 minutes during the conduct of this consultation and >50% of this time involved direct face-to-face encounter for counseling and/or coordination of the patient's care.    Lenn Cal, DDS

## 2014-11-14 NOTE — Patient Instructions (Signed)
[facility]    Department of Dental Medicine     DR. Tadarius Maland      HEART VALVES AND MOUTH CARE:  FACTS:   If you have any infection in your mouth, it can infect your heart valve.  If you heart valve is infected, you will be seriously ill.  Infections in the mouth can be SILENT and do not always cause pain.  Examples of infections in the mouth are gum disease, dental cavities, and abscesses.  Some possible signs of infection are: Bad breath, bleeding gums, or teeth that are sensitive to sweets, hot, and/or cold. There are many other signs as well.  WHAT YOU HAVE TO DO:   Brush your teeth after meals and at bedtime. Spend at least 2 minutes brushing well, especially behind your back teeth and all around your teeth that stand alone. Brush at the gumline also.  Do not go to bed without brushing your teeth and flossing.  If you gums bleed when you brush or floss, do NOT stop brushing or flossing. It usually means that your gums need more attention and better cleaning.   If your Dentist or Dr. Arath Kaigler gave you a prescription mouthwash to use, make sure to use it as directed. If you run out of the medication, get a refill at the pharmacy.   If you were given any other medications or directions by your Dentist, please follow them. If you did not understand the directions or forget what you were told, please call. We will be happy to refresh her memory.  If you need antibiotics before dental procedures, make sure you take them one hour prior to every dental visit as directed.   Get a dental checkup every 4-6 months in order to keep your mouth healthy, or to find and treat any new infection. You will most likely need your teeth cleaned or gums treated at the same time.  If you are not able to come in for your scheduled appointment, call your Dentist as soon as possible to reschedule.  If you have a problem in between dental visits, call your Dentist.  

## 2014-11-16 NOTE — Telephone Encounter (Signed)
Can this encounter be closed?

## 2014-11-17 ENCOUNTER — Ambulatory Visit (INDEPENDENT_AMBULATORY_CARE_PROVIDER_SITE_OTHER): Payer: Medicare HMO | Admitting: Pharmacist

## 2014-11-17 DIAGNOSIS — I4891 Unspecified atrial fibrillation: Secondary | ICD-10-CM

## 2014-11-17 DIAGNOSIS — Z954 Presence of other heart-valve replacement: Secondary | ICD-10-CM

## 2014-11-17 DIAGNOSIS — I482 Chronic atrial fibrillation, unspecified: Secondary | ICD-10-CM

## 2014-11-17 DIAGNOSIS — Z952 Presence of prosthetic heart valve: Secondary | ICD-10-CM

## 2014-11-17 DIAGNOSIS — Z5181 Encounter for therapeutic drug level monitoring: Secondary | ICD-10-CM

## 2014-11-17 LAB — POCT INR: INR: 2.9

## 2014-11-17 NOTE — Telephone Encounter (Signed)
This was done.

## 2014-11-21 ENCOUNTER — Ambulatory Visit (HOSPITAL_COMMUNITY)
Admission: RE | Admit: 2014-11-21 | Discharge: 2014-11-21 | Disposition: A | Discharge status: Home / Self Care | Payer: Medicare HMO | Source: Ambulatory Visit | Attending: Thoracic Surgery (Cardiothoracic Vascular Surgery) | Admitting: Thoracic Surgery (Cardiothoracic Vascular Surgery)

## 2014-11-21 ENCOUNTER — Telehealth: Payer: Self-pay | Admitting: Cardiology

## 2014-11-21 ENCOUNTER — Encounter (HOSPITAL_COMMUNITY): Payer: Self-pay

## 2014-11-21 DIAGNOSIS — Z952 Presence of prosthetic heart valve: Secondary | ICD-10-CM | POA: Diagnosis not present

## 2014-11-21 DIAGNOSIS — I7100 Dissection of unspecified site of aorta: Secondary | ICD-10-CM

## 2014-11-21 DIAGNOSIS — G8929 Other chronic pain: Secondary | ICD-10-CM | POA: Insufficient documentation

## 2014-11-21 DIAGNOSIS — K5730 Diverticulosis of large intestine without perforation or abscess without bleeding: Secondary | ICD-10-CM | POA: Diagnosis not present

## 2014-11-21 DIAGNOSIS — R079 Chest pain, unspecified: Secondary | ICD-10-CM | POA: Diagnosis not present

## 2014-11-21 DIAGNOSIS — R0602 Shortness of breath: Secondary | ICD-10-CM | POA: Diagnosis not present

## 2014-11-21 DIAGNOSIS — R188 Other ascites: Secondary | ICD-10-CM | POA: Diagnosis not present

## 2014-11-21 DIAGNOSIS — I700 Atherosclerosis of aorta: Secondary | ICD-10-CM | POA: Diagnosis not present

## 2014-11-21 DIAGNOSIS — J90 Pleural effusion, not elsewhere classified: Secondary | ICD-10-CM | POA: Insufficient documentation

## 2014-11-21 DIAGNOSIS — I7101 Dissection of thoracic aorta: Secondary | ICD-10-CM | POA: Diagnosis not present

## 2014-11-21 DIAGNOSIS — M545 Low back pain: Secondary | ICD-10-CM | POA: Insufficient documentation

## 2014-11-21 LAB — PULMONARY FUNCTION TEST
DL/VA % pred: 168 %
DL/VA: 6.41 ml/min/mmHg/L
DLCO unc % pred: 113 %
DLCO unc: 21.48 ml/min/mmHg
FEF 25-75 Post: 2.02 L/sec
FEF 25-75 Pre: 1.54 L/sec
FEF2575-%Change-Post: 31 %
FEF2575-%Pred-Post: 82 %
FEF2575-%Pred-Pre: 63 %
FEV1-%CHANGE-POST: 4 %
FEV1-%PRED-POST: 70 %
FEV1-%Pred-Pre: 67 %
FEV1-Post: 1.86 L
FEV1-Pre: 1.78 L
FEV1FVC-%Change-Post: 7 %
FEV1FVC-%Pred-Pre: 101 %
FEV6-%Change-Post: -2 %
FEV6-%Pred-Post: 68 %
FEV6-%Pred-Pre: 69 %
FEV6-Post: 2.22 L
FEV6-Pre: 2.27 L
FEV6FVC-%PRED-PRE: 104 %
FEV6FVC-%Pred-Post: 104 %
FVC-%Change-Post: -2 %
FVC-%PRED-PRE: 66 %
FVC-%Pred-Post: 65 %
FVC-POST: 2.22 L
FVC-Pre: 2.27 L
POST FEV1/FVC RATIO: 84 %
PRE FEV6/FVC RATIO: 100 %
Post FEV6/FVC ratio: 100 %
Pre FEV1/FVC ratio: 78 %
RV % PRED: 67 %
RV: 1.05 L
TLC % PRED: 72 %
TLC: 3.58 L

## 2014-11-21 MED ORDER — IOHEXOL 350 MG/ML SOLN
100.0000 mL | Freq: Once | INTRAVENOUS | Status: AC | PRN
  Administered 2014-11-21: 100 mL via INTRAVENOUS

## 2014-11-21 MED ORDER — ALBUTEROL SULFATE (2.5 MG/3ML) 0.083% IN NEBU
2.5000 mg | INHALATION_SOLUTION | Freq: Once | RESPIRATORY_TRACT | Status: AC
  Administered 2014-11-21: 2.5 mg via RESPIRATORY_TRACT

## 2014-11-21 NOTE — Telephone Encounter (Signed)
Spoke with physical therapy, aware can not find the form that needs to be signed. They are going to refax to [place], aware dr hochrein is out until next week.

## 2014-11-21 NOTE — Telephone Encounter (Signed)
Scott called in stating that Dr. Warren Lacy needs to sign off on the pt's Point of Care forms in order for the pt to receive physical therapy treatment for medicare compliance. Please f/u   Thanks

## 2014-11-23 ENCOUNTER — Encounter (HOSPITAL_COMMUNITY): Payer: Self-pay | Admitting: Dentistry

## 2014-11-23 ENCOUNTER — Ambulatory Visit (HOSPITAL_COMMUNITY): Payer: Self-pay | Admitting: Dentistry

## 2014-11-23 VITALS — BP 137/65 | HR 61 | Temp 98.0°F

## 2014-11-23 DIAGNOSIS — Z952 Presence of prosthetic heart valve: Secondary | ICD-10-CM

## 2014-11-23 DIAGNOSIS — K0530 Chronic periodontitis, unspecified: Secondary | ICD-10-CM

## 2014-11-23 DIAGNOSIS — Z7901 Long term (current) use of anticoagulants: Secondary | ICD-10-CM

## 2014-11-23 DIAGNOSIS — I340 Nonrheumatic mitral (valve) insufficiency: Secondary | ICD-10-CM

## 2014-11-23 DIAGNOSIS — Z01818 Encounter for other preprocedural examination: Secondary | ICD-10-CM

## 2014-11-23 DIAGNOSIS — K036 Deposits [accretions] on teeth: Secondary | ICD-10-CM

## 2014-11-23 NOTE — Progress Notes (Signed)
11/23/2014  Patient:            Lance Cook Date of Birth:  1961/06/21 MRN:                [id number]   BP 137/65 mmHg  Pulse 61  Temp(Src) 98 F (36.7 C) (Oral)   Lance Cook is a 54 year old male with severe mitral regurgitation with anticipated mitral valve replacement in the future. Patient is also status post and Bentall procedure with aortic valve replacement.  Patient is on chronic anticoagulation with warfarin therapy. The patient was given clindamycin 600 mg by mouth one hour prior to dental procedure. Antibiotic verification form was completed. Patient presents for initial periodontal therapy with a gross treatment procedure. Patient agrees to proceed with treatment as planned.  Procedure: D 2353 gross debridement procedure. Utilized Cavitron and hand scales and curettes. [place] Floss. Oral hygiene instruction was provided. Patient was offered fluoride varnish for refused at this time. Patient was given a toothbrush and fluoride toothpaste and floss. The patient tolerated the procedure well with minimal bleeding.  Plan/Recommendations: The patient is now cleared for heart valve surgery at this time. The patient is a follow-up with the general dentist of his choice for routine dental care. Patient wishes to proceed with future dental care with Dr. Johnnette Litter in [place], [place]. Release of information form was completed today.  Patient was dismissed in stable condition.  Lenn Cal, DDS

## 2014-11-23 NOTE — Patient Instructions (Signed)
Brush after meals and at bedtime. Floss at bedtime. Follow-up with general dentist of his choice for routine periodontal maintenance. The patient requires antibiotic premedication prior to invasive dental procedures. Dr. Enrique Sack

## 2014-11-27 ENCOUNTER — Telehealth: Payer: Self-pay | Admitting: Cardiology

## 2014-11-27 DIAGNOSIS — Z01810 Encounter for preprocedural cardiovascular examination: Secondary | ICD-10-CM

## 2014-11-27 DIAGNOSIS — R791 Abnormal coagulation profile: Secondary | ICD-10-CM

## 2014-11-27 DIAGNOSIS — Z01818 Encounter for other preprocedural examination: Secondary | ICD-10-CM

## 2014-11-27 DIAGNOSIS — I38 Endocarditis, valve unspecified: Secondary | ICD-10-CM

## 2014-11-27 DIAGNOSIS — I340 Nonrheumatic mitral (valve) insufficiency: Secondary | ICD-10-CM

## 2014-11-27 NOTE — Telephone Encounter (Signed)
New Message  Pt calling to speak w/ Rn. Pt saw Dr Roxy Manns to move forward w/ Angiogram. Pt was wanting to do procedure w/ [place], but Dr. Buena Irish has no open appts. Please call back and discuss.

## 2014-11-27 NOTE — Telephone Encounter (Signed)
Returned call to patient. He states Dr. [name] referred him to Dr. Roxy Manns for eval of mitral valve (replacment) and Dr. Roxy Manns wanted patient to have L & R heart cath. Patient prefers to have Dr. Angelena Form perform procedure as he is familiar with him and most comfortable with him. Patient states he was told that Dr. Angelena Form was not available until June (not sure if this meant for an office visit or to do cath). He states Dr. Rosezella Florida nurse was supposed to contact Dr. Camillia Herter nurse regarding this matter.   Will defer this message to Dr. [name] & Dr. Angelena Form to advise  Also, patient reports his insurance company is sending over a tier exception request for his lovenox (when he will need bridging).

## 2014-11-28 ENCOUNTER — Encounter: Payer: Self-pay | Admitting: Cardiovascular Disease

## 2014-11-28 NOTE — Telephone Encounter (Signed)
SPOKE TO PATIENT  INFORMATION GIVEN ABOUT CATH FOR 12/18/14. PATIENT AWARE HE WILL NEED TO HAVE PRE CATH LABS DONE  12/07/14 AT THE [address] OFFICE. HE IS AWARE IF HE HAS ANY QUESTION  TO CONTACT OFFICE.   PATIENT IS AWARE COUMADIN CLINIC WILL CONTACT HIM ABOUT LOVENOX  BRIDGING.    PATIENT IS CONCERNED THAT HE WOULD LIKE TO HAVE EVERYTHING DONE DURING THE SAME HOSPITAL STAY IF POSSIBLE CATH AND SURGERY. RN INFORMED PATIENT WILL DEFER THAT REQUEST TO DR [facility] AND OTHER DOCTORS

## 2014-11-28 NOTE — Telephone Encounter (Signed)
Spoke to patient Informed patient of plan. Patient states he is not available for December 08 2014. He states he is able to have procedure any day after December 09 2014. RN informed patient will have it schedule for a day after April 9 ,2016.

## 2014-11-28 NOTE — Telephone Encounter (Signed)
RN spoke to Dr Stanford Breed ( D.O.D.) In the absence of Dr [name]  Per Dr Stanford Breed , patient will need lovenox bridging for Right and  Left Cath Due to aortic mechanical valve and afib. Hold warfarin for 4 days.   Will contact pharmacist Gay Filler to set up lovenox bridging  Would like to set up procedure  For December 08 2014.  Will notify patient and scheduler

## 2014-11-28 NOTE — Telephone Encounter (Signed)
Lance Cook, My cath schedule has spots. I could do it on Wednesday or Thursday of this week or Friday April 8th. We can contact him and see what works best for him. Let me know what date is chosen and I can place cath orders. [name]

## 2014-11-28 NOTE — Telephone Encounter (Signed)
I could do it on April 15th. I am rounding in the CCU then off. If the cath lab has availibility then I can do it that day. If not I could do it on April 18th. His coumadin bridging will need to be arranged in coumadin clinic. [name]

## 2014-11-28 NOTE — Telephone Encounter (Signed)
Please call the patient and try again to set this up with Dr. Angelena Form and Coumadin clinic.

## 2014-11-28 NOTE — Telephone Encounter (Signed)
rgt and lft Cath Procedure schedule for December 18 2014 at 7:30 am Will inform patient and coumadin clinic for lovenox bridging

## 2014-11-29 ENCOUNTER — Encounter: Payer: Self-pay | Admitting: Internal Medicine

## 2014-11-29 NOTE — Telephone Encounter (Signed)
The cath is diagnostic.  Dr. Roxy Manns will need to review these results.  I do not suspect that he is planning to do surgery during the same hospitalization.  The patient will need to discuss timing with Dr. Roxy Manns.

## 2014-11-29 NOTE — Telephone Encounter (Signed)
Left message to call back  

## 2014-11-29 NOTE — Telephone Encounter (Signed)
Spoke to patient.  Patient is aware of upcoming cath and surgery may not be possible to do at same hospitalization.

## 2014-12-04 ENCOUNTER — Telehealth: Payer: Self-pay | Admitting: Cardiology

## 2014-12-04 NOTE — Telephone Encounter (Signed)
Pt called in stating that he is returning [place] call. He is unsure of what the call may be in reference to . Please call back  Thanks

## 2014-12-04 NOTE — Telephone Encounter (Signed)
Spoke to patient RN informed patient no phone call to him today. Patient states he has not heard from the coumadin clinic/Sally in regards to lovenox bridging. RN informed patient will contact for him.

## 2014-12-05 ENCOUNTER — Telehealth: Payer: Self-pay | Admitting: Pharmacist Clinician (PhC)/ Clinical Pharmacy Specialist

## 2014-12-05 NOTE — Telephone Encounter (Signed)
Spoke with patient regarding need for lovenox bridging.  Pt states that he is unable to pay for medication due to financial difficulties.  [employer], per his insurance, copay for 20 syringes would be $348.    [employer] [phone] for tier exception/assistance.  Call took 30 minutes.  They will fax form for Dr. [name] to sign, then fax back.  Told that will have a response from [facility] within 72 hours of them receiving fax.    Faxed Tier exception/PA request to [facility] [phone]

## 2014-12-05 NOTE — Telephone Encounter (Signed)
Let message- spoke with KRISTIN -pharm- coumadin clinic will call patient. Any question call the [address] coumadin clinic

## 2014-12-07 ENCOUNTER — Other Ambulatory Visit (INDEPENDENT_AMBULATORY_CARE_PROVIDER_SITE_OTHER): Payer: Medicare HMO | Admitting: *Deleted

## 2014-12-07 ENCOUNTER — Other Ambulatory Visit: Payer: Self-pay

## 2014-12-07 DIAGNOSIS — I7101 Dissection of ascending aorta: Secondary | ICD-10-CM

## 2014-12-07 DIAGNOSIS — I252 Old myocardial infarction: Secondary | ICD-10-CM

## 2014-12-07 DIAGNOSIS — I482 Chronic atrial fibrillation, unspecified: Secondary | ICD-10-CM

## 2014-12-07 LAB — CBC WITH DIFFERENTIAL/PLATELET
Basophils Absolute: 0 10*3/uL (ref 0.0–0.1)
Basophils Relative: 0.3 % (ref 0.0–3.0)
Eosinophils Absolute: 0.1 10*3/uL (ref 0.0–0.7)
Eosinophils Relative: 1.8 % (ref 0.0–5.0)
HCT: 42.4 % (ref 39.0–52.0)
Hemoglobin: 14 g/dL (ref 13.0–17.0)
Lymphocytes Relative: 8.4 % — ABNORMAL LOW (ref 12.0–46.0)
Lymphs Abs: 0.7 10*3/uL (ref 0.7–4.0)
MCHC: 33 g/dL (ref 30.0–36.0)
MCV: 81.4 fl (ref 78.0–100.0)
MONO ABS: 0.7 10*3/uL (ref 0.1–1.0)
Monocytes Relative: 8.5 % (ref 3.0–12.0)
NEUTROS PCT: 81 % — AB (ref 43.0–77.0)
Neutro Abs: 6.3 10*3/uL (ref 1.4–7.7)
Platelets: 119 10*3/uL — ABNORMAL LOW (ref 150.0–400.0)
RBC: 5.21 Mil/uL (ref 4.22–5.81)
RDW: 17.8 % — ABNORMAL HIGH (ref 11.5–15.5)
WBC: 7.8 10*3/uL (ref 4.0–10.5)

## 2014-12-07 LAB — BASIC METABOLIC PANEL
BUN: 19 mg/dL (ref 6–23)
CHLORIDE: 105 meq/L (ref 96–112)
CO2: 30 mEq/L (ref 19–32)
Calcium: 8.6 mg/dL (ref 8.4–10.5)
Creatinine, Ser: 0.86 mg/dL (ref 0.40–1.50)
GFR: 98.72 mL/min (ref >60.00)
GLUCOSE: 121 mg/dL — AB (ref 70–99)
POTASSIUM: 4.3 meq/L (ref 3.5–5.1)
SODIUM: 138 meq/L (ref 135–145)

## 2014-12-07 LAB — PROTIME-INR
INR: 2.9 ratio — AB (ref 0.8–1.0)
Prothrombin Time: 31.5 s — ABNORMAL HIGH (ref 9.6–13.1)

## 2014-12-07 LAB — APTT: aPTT: 36.7 s — ABNORMAL HIGH (ref 23.4–32.7)

## 2014-12-07 NOTE — Addendum Note (Signed)
Addended by: Eulis Foster on: 12/07/2014 10:11 AM   Modules accepted: Orders

## 2014-12-07 NOTE — Addendum Note (Signed)
Addended by: Eulis Foster on: 12/07/2014 10:10 AM   Modules accepted: Orders

## 2014-12-11 NOTE — Telephone Encounter (Signed)
Informed pt he will need to start lovenox on Thursday. Will call [employer] tomorrow to see if Tier exception approved

## 2014-12-11 NOTE — Telephone Encounter (Signed)
Follow up       Pt want to talk to Gay Filler to get update on lovenox medication

## 2014-12-11 NOTE — Telephone Encounter (Signed)
Wants to know how soon does he needs to take the Lovenox before surgery on 12/18/2014 . Please call   Thanks

## 2014-12-12 ENCOUNTER — Telehealth: Payer: Self-pay | Admitting: *Deleted

## 2014-12-12 ENCOUNTER — Telehealth: Payer: Self-pay | Admitting: Pharmacist Clinician (PhC)/ Clinical Pharmacy Specialist

## 2014-12-12 DIAGNOSIS — E1169 Type 2 diabetes mellitus with other specified complication: Secondary | ICD-10-CM | POA: Insufficient documentation

## 2014-12-12 NOTE — Telephone Encounter (Signed)
Spoke with [facility] PA department, PA/tier exception denied.  Per [facility] rep can take up to 7 days for appeal.  Pt needs to start Medication on Thursday.  Have contacted Dr. Rosezella Florida RN Gari Crown to determine next step.    Spoke with patient, he is aware that may need to go to hospital on Thursday for heparin/lovenox bridging.

## 2014-12-12 NOTE — Telephone Encounter (Signed)
Returning call. Informed patient that he will be receiving a phone call Thursday about what time to be at the hospital. Let patient know that if he doesn't receive a call by 2p.m. To call our office. Patient voiced understanding

## 2014-12-12 NOTE — Telephone Encounter (Signed)
LMTCB to inform patient that he will need to be admitted Thursday for Lovenox bridging pre cath, which is Monday.

## 2014-12-14 ENCOUNTER — Other Ambulatory Visit: Payer: Self-pay | Admitting: *Deleted

## 2014-12-14 ENCOUNTER — Telehealth: Payer: Self-pay | Admitting: Cardiology

## 2014-12-14 MED ORDER — ENOXAPARIN SODIUM 100 MG/ML ~~LOC~~ SOLN: 100.0000 mg | Freq: Two times a day (BID) | SUBCUTANEOUS | Status: DC

## 2014-12-14 NOTE — Progress Notes (Signed)
Lovenox tier was accepted for Jan 2016-Sep 01 2015. Lovenox was phoned in to local pharmacy for patient to be bridged for Cath on Monday. Erasmo Downer was notified and will call patient to notify and instruct patient.

## 2014-12-14 NOTE — Telephone Encounter (Signed)
Spoke with patient on phone, gave verbal information (below) on enoxaparin dosing, pt read back correctly.   Enoxaprin Dosing Schedule  Enoxparin dose: 100 mg  Date  Warfarin Dose (evenings) Enoxaprin Dose  4-12 6    4-13 5 0   4-14 4 0 8 am    8 pm  4-15 3 0 8 am    8 pm  4-16 2 0 8 am    8 pm  4-17 1 0 8 am  4-18 Procedure 5 mg (1 tab)   4-19 1 7.5 mg (1.5 tabs) 8 am    8 pm  4-20 2 7.5 mg (1.5 tabs) 8 am    8 pm  4-21 3 7.5 mg (1.5 tabs) 8 am    8 pm  4-22 4 7.5 mg (1.5 tabs) 8 am    8 pm  4-23 5 5  mg (1 tab) 8 am    8 pm  4-24 6 5  mg (1 tab) 8 am    8 pm    4-25              Repeat INR , appt made for 12:45 pm

## 2014-12-14 NOTE — Telephone Encounter (Signed)
Please call,pt says he talked to you yesterday. He days he needs to talk to you again.

## 2014-12-18 ENCOUNTER — Ambulatory Visit (HOSPITAL_COMMUNITY)
Admission: RE | Admit: 2014-12-18 | Discharge: 2014-12-18 | Disposition: A | Discharge status: Home / Self Care | Payer: Medicare HMO | Source: Ambulatory Visit | Attending: Cardiovascular Disease | Admitting: Cardiovascular Disease

## 2014-12-18 ENCOUNTER — Encounter (HOSPITAL_COMMUNITY)
Admission: RE | Disposition: A | Discharge status: Home / Self Care | Payer: Self-pay | Source: Ambulatory Visit | Attending: Cardiovascular Disease

## 2014-12-18 DIAGNOSIS — I252 Old myocardial infarction: Secondary | ICD-10-CM | POA: Diagnosis not present

## 2014-12-18 DIAGNOSIS — Z8673 Personal history of transient ischemic attack (TIA), and cerebral infarction without residual deficits: Secondary | ICD-10-CM | POA: Diagnosis not present

## 2014-12-18 DIAGNOSIS — I2510 Atherosclerotic heart disease of native coronary artery without angina pectoris: Secondary | ICD-10-CM | POA: Insufficient documentation

## 2014-12-18 DIAGNOSIS — Z9049 Acquired absence of other specified parts of digestive tract: Secondary | ICD-10-CM | POA: Insufficient documentation

## 2014-12-18 DIAGNOSIS — E119 Type 2 diabetes mellitus without complications: Secondary | ICD-10-CM | POA: Diagnosis not present

## 2014-12-18 DIAGNOSIS — E785 Hyperlipidemia, unspecified: Secondary | ICD-10-CM | POA: Insufficient documentation

## 2014-12-18 DIAGNOSIS — G4733 Obstructive sleep apnea (adult) (pediatric): Secondary | ICD-10-CM | POA: Diagnosis not present

## 2014-12-18 DIAGNOSIS — I350 Nonrheumatic aortic (valve) stenosis: Secondary | ICD-10-CM | POA: Diagnosis not present

## 2014-12-18 DIAGNOSIS — D649 Anemia, unspecified: Secondary | ICD-10-CM | POA: Diagnosis not present

## 2014-12-18 DIAGNOSIS — K449 Diaphragmatic hernia without obstruction or gangrene: Secondary | ICD-10-CM | POA: Diagnosis not present

## 2014-12-18 DIAGNOSIS — Z880 Allergy status to penicillin: Secondary | ICD-10-CM | POA: Insufficient documentation

## 2014-12-18 DIAGNOSIS — E291 Testicular hypofunction: Secondary | ICD-10-CM | POA: Diagnosis not present

## 2014-12-18 DIAGNOSIS — I481 Persistent atrial fibrillation: Secondary | ICD-10-CM | POA: Diagnosis not present

## 2014-12-18 DIAGNOSIS — M109 Gout, unspecified: Secondary | ICD-10-CM | POA: Insufficient documentation

## 2014-12-18 DIAGNOSIS — F17210 Nicotine dependence, cigarettes, uncomplicated: Secondary | ICD-10-CM | POA: Insufficient documentation

## 2014-12-18 DIAGNOSIS — Z952 Presence of prosthetic heart valve: Secondary | ICD-10-CM | POA: Diagnosis not present

## 2014-12-18 DIAGNOSIS — Z6841 Body Mass Index (BMI) 40.0 and over, adult: Secondary | ICD-10-CM | POA: Insufficient documentation

## 2014-12-18 DIAGNOSIS — K219 Gastro-esophageal reflux disease without esophagitis: Secondary | ICD-10-CM | POA: Diagnosis not present

## 2014-12-18 DIAGNOSIS — Z86010 Personal history of colonic polyps: Secondary | ICD-10-CM | POA: Diagnosis not present

## 2014-12-18 DIAGNOSIS — I340 Nonrheumatic mitral (valve) insufficiency: Secondary | ICD-10-CM | POA: Diagnosis present

## 2014-12-18 DIAGNOSIS — Z8547 Personal history of malignant neoplasm of testis: Secondary | ICD-10-CM | POA: Diagnosis not present

## 2014-12-18 DIAGNOSIS — Z951 Presence of aortocoronary bypass graft: Secondary | ICD-10-CM | POA: Diagnosis not present

## 2014-12-18 DIAGNOSIS — I10 Essential (primary) hypertension: Secondary | ICD-10-CM | POA: Insufficient documentation

## 2014-12-18 DIAGNOSIS — Z794 Long term (current) use of insulin: Secondary | ICD-10-CM | POA: Insufficient documentation

## 2014-12-18 LAB — POCT I-STAT 3, VENOUS BLOOD GAS (G3P V)
ACID-BASE EXCESS: 1 mmol/L (ref 0.0–2.0)
BICARBONATE: 25.9 meq/L — AB (ref 20.0–24.0)
O2 SAT: 60 %
PO2 VEN: 32 mmHg (ref 30.0–45.0)
TCO2: 27 mmol/L (ref 0–100)
pCO2, Ven: 42.6 mmHg — ABNORMAL LOW (ref 45.0–50.0)
pH, Ven: 7.392 — ABNORMAL HIGH (ref 7.250–7.300)

## 2014-12-18 LAB — PROTIME-INR
INR: 1.28 (ref 0.00–1.49)
Prothrombin Time: 16.2 seconds — ABNORMAL HIGH (ref 11.6–15.2)

## 2014-12-18 LAB — POCT I-STAT 3, ART BLOOD GAS (G3+)
Acid-Base Excess: 1 mmol/L (ref 0.0–2.0)
Bicarbonate: 25.3 mEq/L — ABNORMAL HIGH (ref 20.0–24.0)
O2 Saturation: 90 %
PH ART: 7.42 (ref 7.350–7.450)
TCO2: 27 mmol/L (ref 0–100)
pCO2 arterial: 39.1 mmHg (ref 35.0–45.0)
pO2, Arterial: 57 mmHg — ABNORMAL LOW (ref 80.0–100.0)

## 2014-12-18 LAB — GLUCOSE, CAPILLARY: Glucose-Capillary: 120 mg/dL — ABNORMAL HIGH (ref 70–99)

## 2014-12-18 SURGERY — RIGHT HEART CATH AND CORONARY ANGIOGRAPHY

## 2014-12-18 MED ORDER — ASPIRIN 81 MG PO CHEW
CHEWABLE_TABLET | ORAL | Status: AC
  Administered 2014-12-18: 81 mg
  Filled 2014-12-18: qty 1

## 2014-12-18 MED ORDER — SODIUM CHLORIDE 0.9 % IV SOLN: 250.0000 mL | INTRAVENOUS | Status: DC | PRN

## 2014-12-18 MED ORDER — HEPARIN SODIUM (PORCINE) 1000 UNIT/ML IJ SOLN
INTRAMUSCULAR | Status: AC
  Filled 2014-12-18: qty 1

## 2014-12-18 MED ORDER — SODIUM CHLORIDE 0.9 % IJ SOLN: 3.0000 mL | Freq: Two times a day (BID) | INTRAMUSCULAR | Status: DC

## 2014-12-18 MED ORDER — MIDAZOLAM HCL 2 MG/2ML IJ SOLN
INTRAMUSCULAR | Status: AC
  Filled 2014-12-18: qty 2

## 2014-12-18 MED ORDER — FENTANYL CITRATE (PF) 100 MCG/2ML IJ SOLN
INTRAMUSCULAR | Status: AC
  Filled 2014-12-18: qty 2

## 2014-12-18 MED ORDER — SODIUM CHLORIDE 0.9 % IJ SOLN: 3.0000 mL | INTRAMUSCULAR | Status: DC | PRN

## 2014-12-18 MED ORDER — LIDOCAINE HCL (PF) 1 % IJ SOLN
INTRAMUSCULAR | Status: AC
  Filled 2014-12-18: qty 30

## 2014-12-18 MED ORDER — SODIUM CHLORIDE 0.9 % IV SOLN
INTRAVENOUS | Status: DC
  Administered 2014-12-18: 07:00:00 via INTRAVENOUS

## 2014-12-18 MED ORDER — VERAPAMIL HCL 2.5 MG/ML IV SOLN
INTRAVENOUS | Status: AC
  Filled 2014-12-18: qty 2

## 2014-12-18 MED ORDER — SODIUM CHLORIDE 0.9 % IV SOLN: INTRAVENOUS | Status: AC

## 2014-12-18 MED ORDER — HEPARIN (PORCINE) IN NACL 2-0.9 UNIT/ML-% IJ SOLN
INTRAMUSCULAR | Status: AC
  Filled 2014-12-18: qty 1000

## 2014-12-18 NOTE — Progress Notes (Signed)
Site area: right brachial   Site Prior to Removal:  Level 0  Pressure Applied For 10 MINUTES    Minutes Beginning at 0905  Manual:   Yes.    Patient Status During Pull:  stable  Post Pull brachial Site:  Level 0  Post Pull Instructions Given:  Yes.    Post Pull Pulses Present:  Yes.    Dressing Applied:  Yes.     Bedrest Begins: 0920  Comments:  Pt tolerated right brachial venous sheath pull well.

## 2014-12-18 NOTE — CV Procedure (Signed)
Cardiac Catheterization Operative Report  Lance Cook [id number] 4/18/20167:46 AM SPENCER,SARA C, PA-C  Procedure Performed:  1. Selective Coronary Angiography 2. Right Heart Catheterization 3. Saphenous vein graft angiography 4. Aortic root angiogram  Operator: Lauree Chandler, MD  Indication: 54 yo male with prior mechanical aortic valve replacement, single vessel bypass to the RCA, thoracic aorta replacement (Bentall), DM, HTN, HLD who has been found to have severe mitral regurgitation. He has been seen in the CT surgery office by Dr. Roxy Manns and consideration is being given to mitral valve surgery. Plans in place for right heart cath with coronary angiography today.   Procedure Details: The risks, benefits, complications, treatment options, and expected outcomes were discussed with the patient. The patient and/or family concurred with the proposed plan, giving informed consent. The patient was brought to the cath lab after IV hydration was begun and oral premedication was given. The patient was further sedated with Versed and Fentanyl. There was an IV catheter present in the right antecubital vein. This area was prepped and draped. I then advanced a wire through the IV catheter and exchanged out for a 5 [place] sheath. A balloon tipped catheter was used to perform a right heart catheterization. The right wrist was was prepped and draped in the usual manner. Using the modified Seldinger access technique, a 5/6 French sheath was placed in the right radial artery. 3 mg Verapamil was given through the sheath. 5000 units IV heparin was given. Standard diagnostic catheters were used to perform selective coronary angiography. I could not engage the vein graft. A pigtail catheter was used to perform an aortic root angiogram. I did not cross the mechanical aortic valve. There were no immediate complications. The patient was taken to the recovery area in stable condition.   Hemodynamic  Findings: Ao: 116/63    LV: Not measured RA: 16             RV: 76/2/16 PA: 74/30 (mean 47)      PCWP: 24-28 Fick Cardiac Output: 4.38 L/min Fick Cardiac Index: 2.12 L/min/m2 Central Aortic Saturation: 93% Pulmonary Artery Saturation: 60%  Angiographic Findings:  Left main: The LAD and Circumflex arise from different ostia  Left Anterior Descending Artery: Large caliber vessel that courses to the apex. There appears to be a moderate caliber intermediate branch that arises from the proximal LAD. There is a moderate caliber diagonal branch. The distal LAD becomes small in caliber. There is no obstructive disease.   Circumflex Artery: Large dominant vessel with large obtuse marginal branch. No obstructive disease. I could not selectively engage this vessel despite attempts with multiple catheters but it is found to be patent with non-selective angiography.   Right Coronary Artery: Moderate caliber non-dominant vessel with no obstructive disease. The bypass graft anastomoses in the proximal segment of the native RCA and fills retrograde with injection of the native vessel.   Graft Anatomy:  SVG to RCA: I could not engage this graft. I can see the graft filling retrograde from the native vessel and there is no competitive flow to suggest the graft is patent with antegrade flow. The graft feills retrograde and there is staining noted in what appears to be a dissection plane in the aortic root.   Aortic root angiography: Mild dilatation. No aortic insufficiency.The grafted segment of the aorta is noted and there appears to be a dissection plane in the ascending aorta (see also CTA report).   Left Ventricular Angiogram: Deferred.   Impression: 1.  Patent native coronary arteries with no obstructive disease. Difficult catheter engagement given prior aortic root replacement.  2. Probably occluded vein graft to the RCA (it is seen to fill retrograde from the native RCA and there is no competitive  flow) 3. Elevated pressures as above 4. Abnormal findings in aortic root as noted above.   Recommendations: Will continue workup for mitral valve surgery per Dr. Roxy Manns and Dr. [name]. Resume coumadin tonight and resume Lovenox bridging tonight.        Complications:  None; patient tolerated the procedure well.

## 2014-12-18 NOTE — Discharge Instructions (Signed)
Resume coumadin tonight. Resume Lovenox tonight. Follow up in coumadin clinic Friday of this week.   Radial Site Care Refer to this sheet in the next few weeks. These instructions provide you with information on caring for yourself after your procedure. Your caregiver may also give you more specific instructions. Your treatment has been planned according to current medical practices, but problems sometimes occur. Call your caregiver if you have any problems or questions after your procedure. HOME CARE INSTRUCTIONS  You may shower the day after the procedure.Remove the bandage (dressing) and gently wash the site with plain soap and water.Gently pat the site dry.  Do not apply powder or lotion to the site.  Do not submerge the affected site in water for 3 to 5 days.  Inspect the site at least twice daily.  Do not flex or bend the affected arm for 24 hours.  No lifting over 5 pounds (2.3 kg) for 5 days after your procedure.  Do not drive home if you are discharged the same day of the procedure. Have someone else drive you.  You may drive 24 hours after the procedure unless otherwise instructed by your caregiver.  Do not operate machinery or power tools for 24 hours.  A responsible adult should be with you for the first 24 hours after you arrive home. What to expect:  Any bruising will usually fade within 1 to 2 weeks.  Blood that collects in the tissue (hematoma) may be painful to the touch. It should usually decrease in size and tenderness within 1 to 2 weeks. SEEK IMMEDIATE MEDICAL CARE IF:  You have unusual pain at the radial site.  You have redness, warmth, swelling, or pain at the radial site.  You have drainage (other than a small amount of blood on the dressing).  You have chills.  You have a fever or persistent symptoms for more than 72 hours.  You have a fever and your symptoms suddenly get worse.  Your arm becomes pale, cool, tingly, or numb.  You have heavy  bleeding from the site. Hold pressure on the site and call 911. Document Released: 09/20/2010 Document Revised: 11/10/2011 Document Reviewed: 09/20/2010 [facility] Patient Information 2015 [place], [place]. This information is not intended to replace advice given to you by your health care provider. Make sure you discuss any questions you have with your health care provider.

## 2014-12-18 NOTE — H&P (Signed)
Patient ID: Lance Cook MRN: [id number] DOB/AGE: [birth year] 54 y.o. Admit date: 12/18/2014  Primary Cardiologist: [name]  HPI: 54 yo male with history of DM, HLD, HTN, s/p Bentall aortic root replacement using a mechanical valve conduit with single-vessel coronary artery bypass grafting in the distant past who has been referred for cardiac cath by Dr. [name]. He now has severe symptomatic mitral regurgitation and has been seen by Dr. Roxy Manns for consideration of mitral valve surgery.   Review of systems complete and found to be negative unless listed above   Past Medical History  Diagnosis Date  . Diabetes mellitus   . Hyperlipidemia   . Hypertension   . Arthritis   . Leg pain 06/28/2010  . Hiatal hernia   . Atrial fibrillation     chronic persistent  . Reflux 11/06/09  . Myocardial infarction age 77  . Varicose veins   . Obesity   . Peripheral vascular disease   . Gout   . CAD (coronary artery disease)     Old scar inferior wall myoview, 10/2009 EF 52%  . Chronic LBP 10/08/2011  . Impaired glucose tolerance 10/08/2011  . Erectile dysfunction 10/08/2011  . Testicular cancer   . Bell's palsy   . TIA (transient ischemic attack)     age 69  . CVA (cerebral infarction)     54yo  . OSA (obstructive sleep apnea)     CPAP  . History of colon polyps   . Anemia   . Hypogonadism male 04/08/2012  . Aortic aneurysm   . Aortic aneurysm and dissection   . 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 Ascending aortic dissection 07/14/2008    Localized dissection of ascending aorta noted on CTA in 2009 and stable on CTA in 2011  . DIABETES MELLITUS, TYPE II 11/01/2009    Qualifier: Diagnosis of  By: [name] MD, [name]    . Severe mitral regurgitation 10/11/2014  . Morbid obesity 04/02/2014    Family History  Problem Relation Age of Onset  . Heart disease Mother   . Throat cancer  Mother   . Diabetes Father   . Stroke Other   . Hypertension Other   . Cancer Maternal Aunt     lung, brain  . Cancer Maternal Uncle     brain aneurysm    History   Social History  . Marital Status: Married    Spouse Name: N/A  . Number of Children: 0  . Years of Education: N/A   Occupational History  . Disabled Optician    Social History Main Topics  . Smoking status: Current Some Day Smoker    Types: Cigars  . Smokeless tobacco: Never Used     Comment: about 3 yearly  . Alcohol Use: Yes     Comment: social  . Drug Use: No  . Sexual Activity: Not on file   Other Topics Concern  . Not on file   Social History Narrative    Past Surgical History  Procedure Laterality Date  . Laser ablation  03/06/2010    left leg  . Cardiac valve replacement      [id number]  . Cholecystectomy  2011  . Tonsillectomy  1967  . Otoplasty      bilateral  . Orchiectomy  age 20    testicular cancer  . Tee without cardioversion N/A 10/11/2014  Procedure: TRANSESOPHAGEAL ECHOCARDIOGRAM (TEE);  Surgeon: Pixie Casino, MD;  Location[facility] ENDOSCOPY;  Service: Cardiovascular;  Laterality: N/ADeneen Harts procedure  1988    25 mm St Jude mechanical valve conduit - Dr Blase Mess at [facility] in [place], [place]    Allergies  Allergen Reactions  . Penicillins     REACTION: Hives    Prior to Admission Meds:  Prior to Admission medications   Medication Sig Start Date End Date Taking? Authorizing Provider  allopurinol (ZYLOPRIM) 300 MG tablet Take 300 mg by mouth daily. 11/17/12  Yes Biagio Borg, MD  atenolol (TENORMIN) 100 MG tablet Take 100 mg by mouth 2 (two) times daily. 04/14/14  Yes [name], MD  b complex vitamins tablet Take 1 tablet by mouth daily.     Yes Historical Provider, MD  Calcium-Magnesium-Zinc (CAL-MAG-ZINC PO) Take by mouth daily.     Yes Historical Provider, MD  CARTIA XT 120 MG 24 hr capsule Take 120 mg by mouth daily.  09/27/14  Yes Historical Provider,  MD  clindamycin (CLEOCIN) 300 MG capsule Take 2 capsules by mouth one hour prior to dental appointment. 11/14/14  Yes Lenn Cal, DDS  digoxin (LANOXIN) 0.25 MG tablet Take 0.25 mg by mouth daily.   Yes Historical Provider, MD  enoxaparin (LOVENOX) 100 MG/ML injection Inject 1 mL (100 mg total) into the skin every 12 (twelve) hours. 12/14/14  Yes [name], MD  furosemide (LASIX) 40 MG tablet Take 20-40 mg by mouth daily as needed for fluid. 10/08/11  Yes Biagio Borg, MD  methocarbamol (ROBAXIN) 500 MG tablet Take 500 mg by mouth 3 (three) times daily.  10/08/11  Yes Biagio Borg, MD  ONE TOUCH ULTRA TEST test strip  09/04/14  Yes Historical Provider, MD  oxyCODONE (OXY IR/ROXICODONE) 5 MG immediate release tablet Take 5 mg by mouth as needed.  09/08/14  Yes Historical Provider, MD  potassium chloride (K-DUR) 10 MEQ tablet Take 10 mEq by mouth daily.   Yes Historical Provider, MD  temazepam (RESTORIL) 15 MG capsule Take 15 mg by mouth at bedtime as needed for sleep.   Yes Historical Provider, MD  warfarin (COUMADIN) 5 MG tablet Take 1 to 1.5 tablets by mouth daily as directed by coumadin clinic Patient taking differently: Take 5-7.5 mg by mouth See admin instructions. 7.5mg  on Wednesdays, 5mg  all other days 10/14/14   [name], MD    Physical Exam: Blood pressure 117/73, pulse 94, temperature 98.7 F (37.1 C), temperature source Oral, resp. rate 18, height 5' 1.5" (1.562 m), weight 240 lb (108.863 kg), SpO2 97 %.    General: Well developed, well nourished, NAD  HEENT: OP clear, mucus membranes moist  SKIN: warm, dry. No rashes.  Neuro: No focal deficits  Musculoskeletal: Muscle strength 5/5 all ext  Psychiatric: Mood and affect normal  Neck: No JVD, no carotid bruits, no thyromegaly, no lymphadenopathy.  Lungs:Clear bilaterally, no wheezes, rhonci, crackles  Cardiovascular: Regular rate and rhythm. Systolic murmur. No gallops or rubs.  Abdomen:Soft. Bowel sounds present.  Non-tender.  Extremities: No lower extremity edema. Pulses are 2 + in the bilateral DP/PT.   Labs:   Lab Results  Component Value Date   WBC 7.8 12/07/2014   HGB 14.0 12/07/2014   HCT 42.4 12/07/2014   MCV 81.4 12/07/2014   PLT 119.0* 12/07/2014       ASSESSMENT AND PLAN:   1. Severe mitral regurgitation: Plans being made for surgical correction of  MV. Right and left heart cath today.   Darlina Guys, MD 12/18/2014, 7:18 AM

## 2014-12-18 NOTE — Interval H&P Note (Signed)
History and Physical Interval Note:  12/18/2014 7:26 AM  Lance Cook  has presented today for cardiac cath with the diagnosis of severe mitral regurgitation. The various methods of treatment have been discussed with the patient and family. After consideration of risks, benefits and other options for treatment, the patient has consented to  Procedure(s): LEFT AND RIGHT HEART CATHETERIZATION WITH CORONARY ANGIOGRAM (N/A) as a surgical intervention .  The patient's history has been reviewed, patient examined, no change in status, stable for surgery.  I have reviewed the patient's chart and labs.  Questions were answered to the patient's satisfaction.    No PCI will be performed today.    MCALHANY,CHRISTOPHER

## 2014-12-22 NOTE — H&P (Signed)
PATIENT NAME:  Lance Cook, Lance Cook MR#:  [id number] DATE OF BIRTH:  1960-10-28  DATE OF ADMISSION:  05/04/2013  CHIEF COMPLAINT: Low back pain greater than 20 years.   PROCEDURES: None.   HISTORY OF PRESENT ILLNESS: Lance Cook is a pleasant 54 year old white male with long-standing history of low back pain. This has been present for over 20 years and he has a history of compression of the spine since childhood. He states that his pain is gradually getting worse, with a maximum VAS score of 8, best a 6, average a 7, not influenced by time of day. Certain things exacerbate the pain including prolonged standing and walking. Resting seems to make the pain better, and he is describing associated nighttime cramps, some erectile dysfunction and spasming in the lower extremities. The pain does wake him up at night, and he considers it to be agonizing. He has been on narcotic medications for pain relief.   PAST MEDICAL HISTORY: Significant for prosthetic valve, on Coumadin, and with an abnormal heart rhythm with high blood pressure and previous heart surgeries for this heart valve. He has associated sleep apnea, problems with history of a previous CVA with no residual weakness but a questionable speech dysfunction. He also has a history of insomnia, hiatal hernia, irritable bowel syndrome with anemia, history of diabetes and testicular cancer.   SOCIAL HISTORY: He is married. Never smoked. He is disabled since September 2012 secondary to his back and leg issues.   PAST SURGICAL HISTORY: Include previous heart surgery, prosthetic heart valve with coronary artery bypass graft and cholecystectomy.  MEDICATIONS: Include atenolol, digoxin, diltiazem, Nexium, Lasix, methocarbamol, potassium chloride, vitamin B-complex, Percocet 5/325 one tablet once a day to twice a day, Coumadin 5 mg alternating with 7.5 mg, allopurinol, temazepam.   PHYSICAL EXAMINATION:  VITAL SIGNS: Reveals a pleasant 54 year old white male with a VAS  score of 6/10, temperature 98.4, blood pressure 119/57, pulse 63 and regular, respirations 18, O2 sat 96%.  GENERAL: He is alert, oriented x 3, cooperative and compliant.  HEENT: Pupils are equally round and reactive to light. Extraocular muscles intact.  HEART: Regular rate and rhythm.  LUNGS: Clear to auscultation.  NEUROMUSCULAR: Inspection of low back reveals some paraspinous muscle tenderness, but no overt trigger points noted. Lower extremity strength and function seem to be well preserved except for a mildly antalgic gait, and he does have some pain going from seated to standing. He had a positive straight leg raise on the right side, negative on the left, with some significant paraspinous muscle tenderness. Sensation appears to be intact. His legs are with some mild edema bilaterally.   ASSESSMENT: 1.  Degenerative disk disease with chronic low back pain and right greater than left, right posterolateral L5-S1 radicular-type symptoms.  2.  Facet arthropathy, right greater than left.  3.  Myofascial low back pain.   PLAN: 1.  I am going to get cardiology verification that he can come off of his Coumadin prior to proceeding with epidural injection. We will need to check a PT the morning of the procedure. I have talked to him about the risks and benefits of the procedure, in addition to coming off Coumadin with a history of a heart valve. Based on the cardiology assessment, we will determine whether we can proceed with the injection. I have gone over the risks and benefits of all this with him.  2.  Continue with current medication management.  3.  Return to clinic at the next available date  for his procedure. 4.  We will also consider verification with an MRI of his lumbosacral spine versus CT myelogram.   ____________________________ Alvina Filbert. Andree Elk, MD jga:jm D: 05/17/2013 16:41:03 ET T: 05/17/2013 17:11:23 ET JOB#: [id number]  cc: Alvina Filbert. Andree Elk, MD, <Dictator> Alvina Filbert ADAMS  MD ELECTRONICALLY SIGNED 05/17/2013 22:58

## 2014-12-22 NOTE — Consult Note (Signed)
Brief Consult Note: Diagnosis: Post op bleeding while on anticoagulation with coumadin, lovenox, afib, CVA/TIA, OSA.   Patient was seen by consultant.   Consult note dictated.   Orders entered.   Discussed with Attending MD.   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 Medical consult requested for medical mgmt  * Hematoma while on anticoagulation- 3rd trip to OR in the last 4 days Pt has mechanical aortic valve and needs long term anticoagulation adn bridging, bled twice on lovenox now'discussed with Dr. Humphrey Rolls, cardiology adn will get ECHO today to see any clots and then bridge with IV heparin adn coumadin slowly this time. leaving off anticoagulation today felt benefit more rather than risk unless echo reveals otherwise pt is s/p evacuation of hematoma, drain in place, minima oozing from suture site monitor ENT following  * Afib- rate controlled, on anticouagulation as mentioned above on cardizem, digoxin adn atenelol  * OSA- cpap  * Chronic back pain- cont pain meds.  Electronic Signatures: Gladstone Lighter (MD)  (Signed 19-Jan-14 13:10)  Authored: Brief Consult Note   Last Updated: 19-Jan-14 13:10 by Gladstone Lighter (MD)

## 2014-12-22 NOTE — Consult Note (Signed)
PATIENT NAME:  Lance Cook, Lance Cook MR#:  [id number] DATE OF BIRTH:  Nov 08, 1960  DATE OF CONSULTATION:  09/19/2012  REFERRING PHYSICIAN:   CONSULTING PHYSICIAN:  Dionisio David, MD  REASON FOR CONSULTATION: Cardiology evaluation  HISTORY OF PRESENT ILLNESS: This is a 54 year old white male with a past medical history of atrial fibrillation, mechanical aortic valve with prosthesis of the ascending aortic arch, status post right carotid bypass at [facility].  Had sebaceous cyst which was removed by ENT last week and developed hematoma on Thursday and had surgical evacuation and was bridged with Lovenox and Coumadin.  He developed another hematoma again last night; thus I was asked to evaluate the patient. I was called by ENT to manage anticoagulation. The patient denies any chest pain, shortness of breath, postoperatively he is doing well.   PAST MEDICAL HISTORY: As mentioned:  History of hypertension, diabetes diet controlled, history of atrial fibrillation, history of aortic aneurysm with involvement of the aortic valve, status post St. Jude mechanical valve replacement of the aortic valve, status post right carotid bypass, status post graft of the ascending aorta.  The patient is chronically on Coumadin 10 mg.   SOCIAL HISTORY:  Denies EtOH abuse or smoking.   FAMILY HISTORY:  Positive for congestive heart failure.   PHYSICAL EXAMINATION: GENERAL: He is alert, oriented x 3, in no acute distress right now.  VITALS:  Stable.  NECK: No JVD.  LUNGS: Clear.  HEART: Positive clicks. No murmur.  ABDOMEN: Soft, nontender, positive bowel sounds.  EXTREMITIES: No pedal edema.   LABORATORY AND DIAGNOSTIC DATA:   Hemoglobin is stable.   ASSESSMENT AND PLAN: The patient has sebaceous cyst hematoma. This is the second time.  Advise holding off any anticoagulation today.  Yesterday he got Coumadin and the day before.  Restart heparin and Coumadin 10 mg tomorrow.  Restart heparin.  He will have to stay  in the  hospital while this happens.  I would avoid Lovenox.  I would start heparin because it has short half-life.  One day of holding off on anticoagulant is okay with aortic mechanical valve because the risks of bleeding are much higher.  Thank you very much for the referral.    ____________________________ Dionisio David, MD sak:ct D: 09/19/2012 09:59:58 ET T: 09/19/2012 10:21:10 ET JOB#: [id number]  cc: Dionisio David, MD, <Dictator> Dionisio David MD ELECTRONICALLY SIGNED 10/18/2012 9:02

## 2014-12-22 NOTE — H&P (Signed)
PATIENT NAME:  Lance Cook, Lance Cook MR#:  [id number] DATE OF BIRTH:  1961/07/14  DATE OF ADMISSION:  05/04/2013  CHIEF COMPLAINT: Chronic low back pain with left greater than right posterolateral leg pain.   PROCEDURE: None.   HISTORY OF PRESENT ILLNESS: Lance Cook is a 54 year old white male with a long-standing history of low back pain, but it has bothered him for over 20 years. He relates this to a compression injury sustained from when he was a child. He rates his pain at a maximum VAS of 8, best a 6, average a 7. It does not appear to be influenced by time of day. Certain activities seem to aggravate it including prolonged standing and walking. Resting seems to make it better. It is described with associated nighttime cramping affecting the posterior calves as well as spasms and pain that does wake him up from sleep. He has agonizing type pain that is described today. He has had a previous MRI, but this is unavailable to [place] at this time. He has taken narcotic medications to help with his pain.   PAST MEDICAL HISTORY: He has a history of a prosthetic valve placed many years ago. He has been on chronic Coumadin since that time. He also has high blood pressure and a history of an abnormal heart rhythm. He is followed  at [facility] in [place]. Pulmonary, he has a history of sleep apnea, but no other shortness of breath or associated problems at this time. Neurologic history, he describes a history of a previous stroke with some occasional slurring of his speech, but no other upper or lower extremity weakness. GI, history of hiatal hernia and irritable bowel syndrome. Hematologic, he has a history of easy bruising with red cell damage from his aortic valve replacement. He is also diabetic with a history of testicular cancer.   SOCIAL HISTORY: He is married. No children and has never smoked.   WORK HISTORY: He is disabled since September 2012 secondary to his back issues.   PAST SURGICAL HISTORY: Heart and  testicular surgery. Autoplasty. Tonsillectomy. He also states that he has a right coronary artery bypassed with an AVR back at the age of 89.  CURRENT MEDICATIONS: Include atenolol, allopurinol, digoxin, Lasix, Klor-Con, diltiazem, Coumadin 5 mg Monday, Tuesday, Wednesday, Friday, Saturday and 1.5 on Thursday and Sunday, colchicine, Integra, Percocet 5/325 twice a day, methocarbamol, niacin and vitamin.   PHYSICAL EXAMINATION:  GENERAL: Reveals a well-nourished white male in no acute distress. He is alert, oriented, cooperative and compliant with the examination.  HEENT: Pupils are equally round and reactive to light. Extraocular muscles intact.  HEART: Irregular with a mid-systolic click.  LUNGS: Clear to auscultation bilaterally. No wheezes or rales.  BACK: Inspection of the low back reveals some mild paraspinous muscle tenderness, but no overt trigger points in either the lumbar region or the gluteal region. He has some mild pain on extension.  EXTREMITIES: He is able to extend at the knees bilaterally with 5 over 5 strength both proximal and distal. He has significant venous engorgement with varicosities and purplish discoloration of the skin with a small 1 cm circumscribed ulcer over the left anterior shin. There is significant pretibial edema bilaterally.   ASSESSMENT:  1.  Spinal stenosis.  2.  Degenerative disk disease.  3.  History of previous heart valve placement on Coumadin requiring cardiologic assistance for possible Lovenox bridge for an epidural steroid management.   I have gone over the risks, benefits of the procedure with him in  full detail. All questions are answered. No guarantees are made. We will have him return to the clinic in approximately 2 weeks. In the meantime, I am going to get assistance from his cardiologist with the Lovenox bridge and he will need a PT the morning of his procedure.   ____________________________ Alvina Filbert. Andree Elk, MD jga:aw D: 05/04/2013 16:18:37  ET T: 05/04/2013 16:27:41 ET JOB#: [id number]  cc: Alvina Filbert. Andree Elk, MD, <Dictator> DR. HOLLAND Molli Barrows MD ELECTRONICALLY SIGNED 05/09/2013 13:30

## 2014-12-22 NOTE — Consult Note (Signed)
PATIENT NAME:  Lance Cook, Lance Cook MR#:  [id number] DATE OF BIRTH:  [birth year]  DATE OF CONSULTATION:  09/16/2012  REFERRING PHYSICIAN:  Dr. Nadeen Landau  CONSULTING PHYSICIAN:  Tana Conch. Leslye Peer, MD  PRIMARY CARE PHYSICIAN: Dr. Cathlean Cower at [facility] in [place].   operation: Dr. Nadeen Landau  REASON FOR CONSULTATION: Postoperative management of multiple medical issues.   HISTORY OF PRESENT ILLNESS: This is a 54 year old man who was brought in electively for a parotid mass resection, which he underwent this morning. He was brought back to the operating room for bleeding.  Consult was called after this for management of multiple medical issues and to follow up with the postop bleeding. The patient was on Coumadin, which was on hold for the surgery, and was on a Lovenox bridge. He does have a history of atrial fibrillation and valve replacement and heart disease. He was off his Coumadin. INR prior to the surgery was 1.2. He did not have his Lovenox prior to the operation or yesterday evening. Currently, in the PACU, he feels fine. He has 0/10 pain right now. A stat hemoglobin is being sent off to the lab along with  chemistry. Hospitalist services were contacted for further evaluation.   PAST MEDICAL HISTORY: Testicular cancer at age 37 status post orchiectomy and radiation, diabetes, hypertension, hyperlipidemia, arthritis, obesity, atrial fibrillation, coronary artery disease, CVA, sleep apnea on CPAP, history of colon polyps, chronic low back pain, erectile dysfunction, aortic valve replacement, peripheral vascular disease, gout, gastroesophageal reflux disease, and hiatal hernia.   PAST SURGICAL HISTORY: Parotid mass surgery today and a relook with bleeding, cholecystectomy, tonsillectomy, otoplasty, testicular cancer, open heart surgery with an aortic arch and a Saint Jude valve replacement at age 57.   ALLERGIES TO Penicillin.   MEDICATIONS: As per prescription writer include: Vitamin C 1000 mg  daily; atenolol 100 mg in the day and 50 mg at night; Benadryl as needed; calcium, magnesium, zinc 1 tablet daily; Coumadin 5 mg every day except for Thursday and Sunday where he takes 7.5 mg; digoxin 250 mcg daily; diltiazem 120 mg daily; Lasix 20 mg as needed; Integra 1 capsule daily; methocarbamol 500 mg 2 to 3 times a day; Nexium 40 mg daily as needed; Percocet 5/325 one tablet every eight hours as needed for pain; potassium chloride 10 mEq daily; vitamin E 400 international units daily; vitamin B complex 100 mg 1 tablet daily.   SOCIAL HISTORY: Smokes one cigar per year. Social alcohol. No drug use. Disabled optician.   FAMILY HISTORY: Mother died at 39, unclear cause but did have thyroid mass and gastrointestinal problems. A grandmother with congestive heart failure. Father unknown medical history but he did have diabetes.  The family did have a brain tumor and liver cancer.   Review of systems:  CONSTITUTIONAL: No fever, chills, or sweats.  Positive for a few pound weight loss. No weakness or fatigue.  EYES: He does wear glasses.  Ears, nose, mouth, and throat: Decreased hearing in the left ear, feels like he is under water. Positive for postnasal drip and sneezing. Positive for sore throat now.  NECK: Positive for neck soreness after the surgery.  CARDIOVASCULAR: No chest pain. No palpitations.  RESPIRATORY: Always shortness of breath. No cough. No sputum. No hemoptysis.  GASTROINTESTINAL: Recent colonoscopy negative. No nausea. No vomiting. He does have bowel movements that are loose after each meal. No bright red blood per rectum. No melena.  GENITOURINARY: No burning on urination. No hematuria.  MUSCULOSKELETAL: Positive for chronic  back pain and knee pain.  INTEGUMENTARY: Chronic lower extremity discoloration.  NEUROLOGIC: No fainting or blackouts.  PSYCHIATRIC: No anxiety or depression.  ENDOCRINE: No thyroid problems.  Hematologic/lymphatic: No anemia.   PHYSICAL EXAMINATION:   VITAL SIGNS: Temperature 98.1, pulse 74, respirations 18 blood pressure 106/57, pulse oximetry 98% on 2 liters.  GENERAL: No respiratory distress, lying flat in bed.  HEAD, EYES, EARS, NOSE, AND THROAT: Eyes: Conjunctivae and lids normal. Pupils equal, round, and reactive to light. Extraocular muscles intact. No nystagmus. Lips: Some bruising on the lips. Nasal mucosa no erythema. Throat no erythema. No exudate seen.  NECK: Covered in large bandage, drain coming out. Large bandage around neck, unable to examine much.  LUNGS: Clear to auscultation. No use of accessory muscles to breathe. No rhonchi, rales, or wheeze heard.  CARDIOVASCULAR: S1, S2 normal, irregularly irregular. Positive for 3/6 systolic ejection murmur. Carotid upstroke difficult to palpate with a large bandage on neck.  Lower extremity 3+ edema.  ABDOMEN: Soft, nontender. No organomegaly/splenomegaly. Normoactive bowel sounds. No masses felt.  LYMPHATIC: No lymph nodes in the neck.  MUSCULOSKELETAL: 3+ edema. No clubbing. No cyanosis.  SKIN: Chronic lower extremity discoloration, blackish brownish discoloration. There is a skin break on the right lower extremity.  NEUROLOGICAL: Cranial nerves grossly intact. Did not test shoulder shrugs secondary to neck surgery.  PSYCHIATRIC: The patient is oriented to person, place, and time.   LABORATORY AND RADIOLOGICAL DATA: Stat CBC showed a white blood cell count of 12.0, hemoglobin and hematocrit 14.0 and 41.0, platelet count of 112.   ASSESSMENT AND PLAN:  1.  Postoperative bleeding for neck mass. The patient required to go back to the operating room.  Hemoglobin is stable. Will check another hemoglobin in the morning.  Need to start Lovenox back as soon as possible.  Will hold off tonight with the bleeding and restart in the morning.  Can start Coumadin tomorrow. Normally with valve replacement patients, I usually start heparin drip as soon as possible, but he came in with the plan to have  a Lovenox bridge both ways as per his primary care physician.  Case discussed with Dr. Nadeen Landau, okay to start steroids and will just go with sliding scale at this point.  2.  Atrial fibrillation, a metallic valve, and history of heart disease. Will start Lovenox tomorrow morning, Coumadin. The patient is rate controlled with digoxin, Cardizem, and atenolol.  3.  Hypertension. Blood pressure currently stable.  4.  Diabetes. We will put on sliding scale and watch the sugars. They will be elevated with the steroids.  5.  Gastroesophageal reflux disease, as needed Nexium.  6.  Sleep apnea on CPAP.  7.  Chronic low back pain.  8.  Gout.   Time Spent ON consultation: Fifty-five minutes.     ____________________________ Tana Conch. Leslye Peer, MD rjw:th D: 09/16/2012 21:43:15 ET T: 09/16/2012 22:21:11 ET JOB#: [id number]  cc: Tana Conch. Leslye Peer, MD, <Dictator> DR. Cathlean Cower, [place], Rosebud Poles, MD  Marisue Brooklyn MD ELECTRONICALLY SIGNED 09/17/2012 18:46

## 2014-12-22 NOTE — Op Note (Signed)
PATIENT NAME:  Lance Cook, Lance Cook MR#:  [id number] DATE OF BIRTH:  08/23/1961  DATE OF PROCEDURE:  09/16/2012  SURGEON:  Janalee Dane, MD  PREOPERATIVE DIAGNOSIS:  Left neck hematoma and ongoing bleeding.   POSTOPERATIVE DIAGNOSIS:  Left neck hematoma and ongoing bleeding.   PROCEDURE:  Exploration of left neck with evacuation of hematoma and control of hemorrhage.   DESCRIPTION OF PROCEDURE:  The patient was placed in the supine position on the operating room table. After general endotracheal anesthesia had been induced, the patient was turned 90 degrees clockwise from anesthesia. The wound had been opened on the floor and was copiously irrigated after prepping and draping in the usual fashion. After copious irrigation and small bleeding points were cauterized, irrigation was carried out yet again. The entire wound was explored, and other than diffuse oozing, there were no further areas of bleeding. Certainly, no arterial or venous sources of bleeding. Additional irrigation was used, and the wound was then closed over a 10 mm Jackson-Pratt drain. During closure, additional irrigation was irrigated through the subplatysmal closure to confirm that the drain was functioning properly. After placement of the skin sutures (5-0 nylon), the neck was dressed with fluffs and circum-cervical Kerlix dressing. The patient was then returned to anesthesia, allowed to emerge from anesthesia in the operating room, and taken to the recovery room in stable condition. There were no complications.   ESTIMATED BLOOD LOSS: 100 mL.    ____________________________ J. Nadeen Landau, MD jmc:dm D: 09/16/2012 21:38:01 ET T: 09/16/2012 22:43:51 ET JOB#: [id number]  cc: Janalee Dane, MD, <Dictator> Nicholos Johns MD ELECTRONICALLY SIGNED 10/12/2012 19:27

## 2014-12-22 NOTE — Op Note (Signed)
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   PROCEDURE:  Left total parotidectomy with facial nerve dissection and preservation.   SURGEON:  Janalee Dane, MD   ASSISTANT:  Sammuel Hines. Richardson Landry, MD  DESCRIPTION OF PROCEDURE:  The patient was placed in the supine position on the operating room table. After general endotracheal anesthesia had been induced, the patient was turned 90 degrees clockwise from anesthesia and placed on a shoulder roll and the planned incision was marked, locally anesthetized, prepped and draped in the usual fashion. A #15 blade was used to incise the skin and the skin was raised in a deep subcutaneous fashion above the angle of the mandible and in a subplatysmal plane below the angle of the mandible. Dissection was carried down in the preauricular space down to the stylomastoid foramen. The main trunk of the facial nerve was identified and stimulated for confirmation keeping the nerve in sight. 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 The mass was largely deep to the facial nerve and surrounding it as well as the retromandibular vein. This dissection was carried inferior to the body of the mandible and below the platysma into the submandibular space. It was excised incontinuity with the lower two thirds of the parotid gland. The specimen was oriented for pathology and the description of the orientation was personally discussed with the pathologist by me.   Once the specimen had been delivered, the wound was copiously irrigated with saline and closed in layers over  a 10 mm Jackson-Pratt drain brought through a separate stab incision posterior to the flaps. The facial nerve was preserved in its entirety and approximately 40 mL of blood loss. Skin sutures were 6-0 nylon and 4-0 silk suture as a drain stitch. Bacitracin was applied. The patient was returned to anesthesia, allowed to emerge from anesthesia in the operating room and taken to the recovery room in stable condition. There were no complications.   ESTIMATED BLOOD LOSS:  Again 40 mL.    ____________________________ J. Nadeen Landau, MD jmc:si D: 09/16/2012 15:19:25 ET T: 09/16/2012 17:28:12 ET JOB#: [id number]  cc: Janalee Dane, MD, <Dictator> Nicholos Johns MD ELECTRONICALLY SIGNED 10/12/2012 19:27

## 2014-12-22 NOTE — H&P (Signed)
Subjective/Chief Complaint left sided facial swelling    History of Present Illness 54 y.o. male with history of A Fib and mechanical heart valve presented to ED with left sided facial swelling.  Patient underwent left superficial parotidectomy on 09/17/12 and experienced a post-operative hematoma which required reinsertion of drain in OR that evening.  He was discharged the following day and began taking Coumadin 80m once a day as well as the Lovenox bridge that he was on prior to surgery.  Early AM on 09/19/12 he developed extensive facial swelling and tightness and presented to ED for evaluation.    Past Medical Health Coronary Artery Disease, Hypertension, Diabetes Mellitus, Cancer, Stroke   Past Med/Surgical Hx:  Diabetes:   DVT:   CVA:   anemia:   Testicular Cancer: Age 89 Atrial Fibrillation:   Prosthetic Heart Valve:   Coronary Bypass Graft:   Cholecystectomy:   ALLERGIES:  Penicillin: Hives, Itching  HOME MEDICATIONS: Medication Instructions Status  clindamycin 300 mg oral capsule 1 cap(s) orally every 8 hours Active  Benadryl 25 mg oral tablet 2 tab(s) orally once a day (at bedtime), As Needed Active  Percocet 5/325 oral tablet 1 tab(s) orally every 8 hours, As Needed- for Pain  Active  atenolol 100 mg oral tablet 1 tab(s) orally once a day and 1/2 tab every evening Active  vitamin E 400 intl units oral capsule 1 cap(s) orally once a day Active  ascorbic acid 1000 mg oral tablet 1 tab(s) orally once a day in am Active  Coumadin 5 mg oral tablet 1.5 tab(s) orally once a day on Thursday and Sunday at hs Active  Coumadin 5 mg 1 tab(s) orally once a day (at bedtime) on Mon-Tues-Wed-Fri-Sat Active  digoxin 250 mcg (0.25 mg) oral tablet 1 tab(s) orally once a day in am Active  diltiazem 120 mg/24 hours oral capsule, extended release 1 cap(s) orally once a day in am Active  nexium 415m1 tab oral daily prn Active  furosemide 40 mg oral tablet 0.5 - 1 tab orally prn Active   methocarbamol 500 mg oral tablet 1 tab orallly 2 - 3 times daily as needed  Active  potassium chloride 10 mEq oral tablet, extended release 1 tab(s) orally once a day, As Needed when taking Lasix only Active  Testosterone Cypionate 100 milligram(s) intramuscular once a week (stopped for now) Active  Vitamin B Complex 100 1 cap(s) orally once a day in am Active  Integra oral capsule 1 cap(s) orally once a day in am Active  Vit E 400 iu 1 tab(s) orally once a day (in the morning) Active  calcium Magnesium Zinc 33319m tab oral daily in am Active   Family and Social History:   Family History Coronary Artery Disease  Hypertension  Diabetes Mellitus  Cancer  Smoking    Social History positive ETOH    Place of Living Home   Review of Systems:   Fever/Chills No    Cough No    Sputum No    Abdominal Pain No    Diarrhea No    Constipation No    Nausea/Vomiting No    SOB/DOE No    Chest Pain No    Dysuria No    Tolerating Diet Yes   Physical Exam:   GEN well developed, well nourished, no acute distress    HEENT Oropharynx clear, significant and firm left sided facial swelling c/w hematoma, mild weakness of left marginal mandibular branch of CNVII, floor  of mouth clear    NECK supple    RESP normal resp effort    CARD irregular rate    LYMPH negative neck    SKIN normal to palpation   Lab Results: Routine Chem:  19-Jan-14 04:11    Glucose, Serum  108   BUN  25   Creatinine (comp) 0.78   Sodium, Serum 140   Potassium, Serum 4.3   Chloride, Serum 106   CO2, Serum 27   Calcium (Total), Serum 8.6   Anion Gap 7   Osmolality (calc) 284   eGFR (African American) >60   eGFR (Non-African American) >60 (eGFR values <67m/min/1.73 m2 may be an indication of chronic kidney disease (CKD). Calculated eGFR is useful in patients with stable renal function. The eGFR calculation will not be reliable in acutely ill patients when serum creatinine is changing rapidly. It  is not useful in  patients on dialysis. The eGFR calculation may not be applicable to patients at the low and high extremes of body sizes, pregnant women, and vegetarians.)     Assessment/Admission Diagnosis Post-operative hematoma of left neck following a Par[place] 1)  Evacuation of large hematoma and placement of drain and compression dressing in OR under MAC 2)  Discussed heparin bridge with Cardiology 3)  Will have Medicine involved post-operatively with management of multiple medical issues 4)  Risks and benefits of the procedure discussed.  Patient understands mild amount of continued ooze from drain possible given need for continued anticoagulation.   Electronic Signatures: Bianna Haran, CShela Leff(MD)  (Signed 19-Jan-14 05:41)  Authored: CHIEF COMPLAINT and HISTORY, PAST MEDICAL/SURGIAL HISTORY, ALLERGIES, HOME MEDICATIONS, FAMILY AND SOCIAL HISTORY, REVIEW OF SYSTEMS, PHYSICAL EXAM, LABS, ASSESSMENT AND PLAN   Last Updated: 19-Jan-14 05:41 by VPascal Lux(MD)

## 2014-12-22 NOTE — Op Note (Signed)
PATIENT NAME:  Lance Cook, Lance Cook MR#:  [id number] DATE OF BIRTH:  [birth year]  DATE OF PROCEDURE:  09/19/2012  PREOPERATIVE DIAGNOSIS:  Postoperative hematoma of the left face, neck and parotid.   POSTOPERATIVE DIAGNOSIS:  Postoperative hematoma of the left face, neck and parotid.  PROCEDURE PERFORMED: Evacuation of postoperative hematoma.   SURGEON: Jerene Bears, MD   ANESTHESIA: MAC.   ESTIMATED BLOOD LOSS: 75 mL.   IV FLUIDS: Please see anesthesia record.   COMPLICATIONS: None.   DRAINS/STENT PLACEMENTS: A size 10 JP drain.   CURRENT MEDICATIONS: Thrombin and clindamycin.   SPECIMENS: None.   COMPLICATIONS: None.   INDICATIONS FOR PROCEDURE: The patient is a 54 year old male status post superficial parotidectomy for a lipoma on 09/16/2012. He had a postoperative hematoma that was evacuated that evening of 09/16/2012 by Dr. Nadeen Landau. He began taking his Coumadin the weekend and had significant increase in size of his left facial swelling.  He presents for evacuation.   OPERATIVE FINDINGS: Large left neck hematoma successfully evacuated.  Copious irrigation was performed. The nerve was spared any injury. Several areas of bleeding were cauterized with bipolar, but there was diffuse oozing throughout all the parotid tissues.  Thrombin was placed as well as a large JP drain.   DESCRIPTION OF PROCEDURE: After the patient was identified in holding, the benefits and risks of the procedure were discussed and consent was reviewed. The patient was taken to the Operating Room and placed in the supine position. MAC anesthesia was employed.  The patient's left neck was injected with 6 mL of 1% lidocaine with 1:200,000 epinephrine, and then the patient was prepped and draped in a sterile fashion. At this time, the posterior incision site was opened and the previous nylon and Vicryl sutures were removed. A large hematoma and some dark red blood was evacuated under pressure. At this time, the  wound was copiously irrigated with sterile saline. Large clots were removed throughout the entire wound bed. Care was taken to avoid injury to the facial nerve trunk. There was a small area of punctate bleeding on the sternocleidomastoid, as well as along the posterior skin flap, as well as several along the superior edge of the parotid gland. These were taken care of using bipolar micro cautery.  Again, the wound was copiously irrigated and meticulously inspected throughout. There was still some mild oozing but no definitive arterial or venous bleeding vessel that was noted. At this time, the wound was again copiously irrigated with sterile saline. The skin was closed in interrupted fashion with deep Vicryls. A large JP drain was placed to suction within the thyroid wound bed, and the skin was closed in an interrupted fashion with Nylon suture. The drain was made to come out the mid portion of the posterior aspect of the incision site, and this was held in place with a single 3-0 nylon stitch. At this time, bacitracin ointment was placed on the parotid incision.  The gauze as well as an elastic head wrap was then placed for compression. The JP drain was placed in the vacuum. The patient was transferred to Anesthesia. The patient had full use of all facial nerve branches following the procedure. He had some mild weakness of the marginal mandibular branch of the left which was consistent with preoperative evaluation. The patient was taken to the PACU in good condition.   ____________________________ Jerene Bears, MD ccv:[id number] D: 09/19/2012 08:29:06 ET T: 09/19/2012 18:31:56 ET JOB#: [id number]  cc: Jerene Bears, MD, <  Dictator> Jerene Bears MD ELECTRONICALLY SIGNED 09/20/2012 8:45

## 2014-12-22 NOTE — Consult Note (Signed)
PATIENT NAME:  Lance, Cook MR#:  [id number] DATE OF BIRTH:  07-Aug-1961  DATE OF CONSULTATION:  09/19/2012  CONSULTING PHYSICIAN:  Gladstone Lighter, MD  ADMITTING PHYSICIAN:  Carloyn Manner, MD.   PRIMARY CARE PHYSICIAN: Cathlean Cower, MD in [place].   PRIMARY CARDIOLOGIST: [name], MD in [place].  REASON FOR CONSULTATION: Postoperative management for multiple medical problems.  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  The procedure was complicated by postoperative bleeding and he was taken back to the OR and had to hold the Lovenox to stop the bleeding.  At the time of discharge, the patient was discharged on bridging Lovenox and also higher doses of Coumadin at 10 mg daily. He had been doing fine for the last couple of days.  Last night the patient noted minimal swelling at the parotid site. He called Dr. Pryor Ochoa, who recommended to put some ice, apply pressure and call him back if it gets worse.  The patient woke up a few hours later in the middle of the night with pain of the left side of the face and noticed that it was acutely swollen, probably from hematoma and was brought to the ER. The patient had an immediate evacuation of the left parotid hematoma taken out and Medical and Cardiology consults have been requested to help with anticoagulation postoperatively and in light of his aortic valve replacement.   PAST MEDICAL HISTORY:  1.  History of testicular cancer at the age of 58.  2.  Borderline diabetes mellitus.  3.  Hypertension.  4.  Hyperlipidemia.  5.  Arthritis.  6.  Chronic low back pain.  7.  Coronary artery disease.  9.  Atrial fibrillation.  10.  Sleep apnea.  11.  History of colonic polyps.  12.  Gout.   13.  Gastroesophageal reflux disease.  14.  Hiatal hernia.  15.  Peripheral vascular disease.  16.  History of mechanical aortic valve replacement.  PAST SURGICAL HISTORY: 1.  Recent parotid mass surgery and parotid hematoma evacuation done today.  2.  Cholecystectomy.  3.  Tonsillectomy.  4.  Otoplasty.  5.  Testicular cancer status post orchiectomy. 6.  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 valve replaced about 25 years ago.   ALLERGIES: PENICILLIN.  MEDICATIONS: 1.  Vitamin C 1000 mg p.o. daily.  2.  Atenolol 100 mg p.o. in the morning and 50 mg at night.  3.  Calcium, magnesium, zinc supplements 1 tablet p.o. daily.  4.  Coumadin 5 mg every day, except Thursday and Sunday, when he takes 7.5 mg.  5.  Digoxin 250 mcg p.o. daily.  6.  Diltiazem 20 mg p.o. daily.  7.  Lasix 20 mg p.o. daily.  8.  Integra 1 capsule p.o. daily. 9.  Methocarbamol 500 mg 2 to 3 times a day.  10.  Nexium 40 mg p.o. daily.  11.  Percocet 5/325 1 tablet every eight hours, as needed for pain.  12.  Potassium chloride 10 mEq p.o. daily.  13.  Vitamin E 400 international units daily.  14.  Vitamin B complex 100 mg 1 tablet p.o. daily.   SOCIAL HISTORY: No smoking, occasional alcohol use. No drug use. He is disabled.  FAMILY HISTORY: Mom died at 72, had  history of thyroid mass and GI problems.  Grandmother with congestive heart failure.  Also family history of brain tumor and liver cancer. Father had diabetes.   REVIEW OF SYSTEMS: CONSTITUTIONAL: No fever, fatigue, or weakness.  EYES: No blurred vision, double vision, glaucoma, or cataracts. He does wear reading glasses.  ENT: Decreased eating in the last two years.  No postnasal drip, tinnitus or sore throat or difficulty swallowing. No cough, wheeze, hemoptysis, or CVA.  CARDIOVASCULAR: No chest pain, orthopnea, edema, palpitations, or syncope.  GI: No nausea, vomiting, abdominal pain, hematemesis, or melena.   GENITOURINARY: No dysuria, hematuria, renal calculus, frequency, or incontinence.  ENDOCRINE: No polyuria, nocturia, thyroid problems, heat or cold intolerance.  HEMATOLOGY: No anemia, positive for bleeding.  MUSCULOSKELETAL: Positive for back pain, knee pain and also arthritis and gout.  NEUROLOGIC: No numbness or weakness, fainting or blackouts.  History of CVA, TIAs, with no residual neurological deficits.  PSYCHOLOGICAL: Denied anxiety, insomnia, or depression.   PHYSICAL EXAMINATION:  VITAL SIGNS: Temperature 98.6 degrees Fahrenheit, pulse 76, respirations 18, blood pressure 149/80 with sats 97% on room air.  GENERAL: He is heavily built, well nourished male lying in bed, not in any acute distress.  HEENT: Normocephalic, atraumatic. Pupils are equal, round, reacting to light. Anicteric sclerae. Extraocular movements intact. Oropharynx clear without erythema, mass or exudates. There is dressing in the left paravertebral region, with a little bit of oozing at the suture line. Tightly wrapped pressure bandage all over his head is present and also a JP drain coming from the sutures with minimal amount of blood coming.  NECK: Supple. No thyromegaly or carotid bruits.  LUNGS: Clear to auscultation bilaterally. No wheeze or crackles. No use of accessory muscles for breathing.  CARDIOVASCULAR: S1 and S2, irregularly irregular. There is a very loud 4/6 systolic murmur in the aortic area, with lower extremity 2+ edema which looks chronic.  ABDOMEN: Soft, nontender, nondistended. No hepatosplenomegaly. Normal bowel sounds.  EXTREMITIES: Lower extremity edema which is chronic with chronic skin changes.  LYMPHATICS: No cervical lymphadenopathy.  NEUROLOGIC: Cranial nerves intact. No focal motor or sensory deficits.  PSYCHIATRIC:  The patient is awake, alert, oriented x 3.   DIAGNOSTIC DATA:  WBC 11.8, hemoglobin 13.3, hematocrit 38.8, platelet count 103, sodium 140, potassium 4.6, chloride 106,  bicarbonate 27, BUN 26, creatinine 0.7 glucose 118, calcium 8.6. INR is 1.4 today.   RECOMMENDATIONS:  A 54 year old male with history of multiple medical problems, status post a mechanical valve replacement, history of atrial fibrillation on Coumadin, hypertension, diabetes, recently had parotid tumor resection, done from the left parotid area and had postoperative bleeding, admitted today for parotid hematoma while on anticoagulation.  1.  Left parotid hematoma, while on anticoagulation,3 trips to OR in the last four days. The patient does have a mechanical aortic valve, atrial fibrillation and does need long-term anticoagulation and bridging. Since he bled twice in the last few days on Lovenox, discussed with Dr. Humphrey Rolls from Cardiology and per his recommendations would hold off on any anticoagulation today as the benefit versus risk of his recent bleeding. Get an echo to see any clots.  If there are none, then bridge with IV heparin instead of Lovenox and the Coumadin starting tomorrow.  The patient is status post evacuation of hematoma and has a drain in place and further management per ENT.  2.  Atrial fibrillation, rate appears controlled. Continue atenolol, digoxin, Cardizem.  Continue anticoagulation, but monitor as mentioned above.  3.  Diabetes mellitus.  On sliding scale insulin. He is a borderline diabetic.  4.  Obstructive sleep apnea. Continue C-PAP.  5.  Gastroesophageal reflux disease on Nexium. 6.  Chronic low back pain, continue on medications.  7.  CODE STATUS: Full code.   Time Spent in consultation: 55 minutes.  ____________________________ Gladstone Lighter, MD rk:eg D: 09/19/2012 13:23:39 ET T: 09/19/2012 17:06:37 ET JOB#: [id number]  cc: Gladstone Lighter, MD, <Dictator> Jerene Bears, MD [name], MD, Cardiology, [place], Lorina Rabon, MD, [place], [facility] Gladstone Lighter MD ELECTRONICALLY SIGNED 10/06/2012 15:32

## 2014-12-25 ENCOUNTER — Encounter: Payer: Self-pay | Admitting: Thoracic Surgery (Cardiothoracic Vascular Surgery)

## 2014-12-25 ENCOUNTER — Ambulatory Visit (INDEPENDENT_AMBULATORY_CARE_PROVIDER_SITE_OTHER): Payer: Medicare HMO | Admitting: *Deleted

## 2014-12-25 ENCOUNTER — Ambulatory Visit (INDEPENDENT_AMBULATORY_CARE_PROVIDER_SITE_OTHER): Payer: Medicare HMO | Admitting: Thoracic Surgery (Cardiothoracic Vascular Surgery)

## 2014-12-25 VITALS — BP 136/77 | HR 80 | Resp 16 | Ht 61.5 in | Wt 237.0 lb

## 2014-12-25 DIAGNOSIS — I482 Chronic atrial fibrillation, unspecified: Secondary | ICD-10-CM

## 2014-12-25 DIAGNOSIS — Z954 Presence of other heart-valve replacement: Secondary | ICD-10-CM

## 2014-12-25 DIAGNOSIS — Z952 Presence of prosthetic heart valve: Secondary | ICD-10-CM

## 2014-12-25 DIAGNOSIS — I4891 Unspecified atrial fibrillation: Secondary | ICD-10-CM

## 2014-12-25 DIAGNOSIS — I340 Nonrheumatic mitral (valve) insufficiency: Secondary | ICD-10-CM | POA: Diagnosis not present

## 2014-12-25 DIAGNOSIS — Z5181 Encounter for therapeutic drug level monitoring: Secondary | ICD-10-CM

## 2014-12-25 LAB — POCT INR: INR: 1.7

## 2014-12-25 NOTE — Patient Instructions (Signed)
Call to schedule follow up appointment and surgery   Follow up appointment will need to be approximately 7 days prior to surgery

## 2014-12-25 NOTE — Progress Notes (Signed)
[address]       [place], [address]             [phone]     CARDIOTHORACIC SURGERY OFFICE NOTE  Referring Provider is [name], MD PCP is [name], PA-C   HPI:  Patient returns to the office today for follow-up of severe symptomatic primary mitral regurgitation. He was originally seen in consultation on 11/13/2014. Since then he underwent dental examination and cleaning by Dr. Lawana Chambers.  He has been cleared for surgery from that standpoint. He also underwent CT angiography, and more recently the patient underwent left and right heart catheterization by Dr. Angelena Form.  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 However, the patient does not have significant coronary artery disease. He does have severe pulmonary hypertension. The patient returns to the office today to discuss treatment options further. He reports no new problems or complaints over the last few weeks. He continues to experience chronic symptoms of exertional shortness of breath with very mild physical activity, consistent with chronic diastolic congestive heart failure New York Heart Association functional class III.   Current Outpatient Prescriptions  Medication Sig Dispense Refill  . allopurinol (ZYLOPRIM) 300 MG tablet Take 300 mg by mouth daily.    Marland Kitchen atenolol (TENORMIN) 100 MG tablet Take 100 mg by mouth 2 (two) times daily.    Marland Kitchen b complex vitamins tablet Take 1 tablet by mouth daily.      . busPIRone (BUSPAR) 7.5 MG tablet Take 7.5 mg by mouth at bedtime.    . Calcium-Magnesium-Zinc (CAL-MAG-ZINC PO) Take by mouth daily.      Marland Kitchen CARTIA XT 120 MG 24 hr capsule Take 120 mg by mouth daily.     . clindamycin (CLEOCIN) 300 MG capsule Take 2 capsules by mouth one hour prior to dental appointment. 2 capsule 1  . digoxin (LANOXIN) 0.25 MG tablet Take 0.25 mg by mouth daily.    Marland Kitchen enoxaparin (LOVENOX) 100 MG/ML  injection Inject 1 mL (100 mg total) into the skin every 12 (twelve) hours. 20 Syringe 0  . furosemide (LASIX) 40 MG tablet Take 20-40 mg by mouth daily as needed for fluid.    . methocarbamol (ROBAXIN) 500 MG tablet Take 500 mg by mouth 3 (three) times daily.     . ONE TOUCH ULTRA TEST test strip     . oxyCODONE (OXY IR/ROXICODONE) 5 MG immediate release tablet Take 5 mg by mouth as needed.     . potassium chloride (K-DUR) 10 MEQ tablet Take 10 mEq by mouth daily.    . temazepam (RESTORIL) 15 MG capsule Take 15 mg by mouth at bedtime as needed for sleep.    Marland Kitchen warfarin (COUMADIN) 5 MG tablet Take 1 to 1.5 tablets by mouth daily as directed by coumadin clinic (Patient taking differently: Take 5-7.5 mg by mouth See admin instructions. 7.5mg  on Wednesdays, 5mg  all other days) 120 tablet 1   No current facility-administered medications for this visit.      Physical Exam:   BP 136/77 mmHg  Pulse 80  Resp 16  Ht 5' 1.5" (1.562 m)  Wt 237 lb (107.502 kg)  BMI 44.06 kg/m2  SpO2 95%  General:  Obese but well appearing  Chest:   Clear to auscultation  CV:   Irregular heart rhythm with mechanical heart sounds and systolic murmur  Incisions:  n/a  Abdomen:  Soft and nontender  Extremities:  Warm and well-perfused with mild to moderate lower extremity edema  Diagnostic Tests:  CT ANGIOGRAPHY CHEST, ABDOMEN AND PELVIS   TECHNIQUE: Multidetector CT imaging through the chest, abdomen and pelvis was performed using the standard protocol during bolus administration of intravenous contrast. Multiplanar reconstructed images and MIPs were obtained and reviewed to evaluate the vascular anatomy.   CONTRAST:  152mL OMNIPAQUE IOHEXOL 350 MG/ML SOLN   COMPARISON:  10/19/2009   FINDINGS: CTA CHEST   The non construct contrast scout shows changes of median sternotomy and AVR with ascending aortic repair. Moderate coronary calcifications. No hyperdense crescent or mediastinal hematoma.   CTA via  right arm contrast injection. The SVC is patent. Mild right atrial enlargement. RV/LV ratio less than 1, normal. Dilated central pulmonary arteries. Satisfactory opacification of pulmonary arteries noted, and there is no evidence of pulmonary emboli. Patent superior and inferior pulmonary veins bilaterally. Marked left atrial enlargement. Probable thrombus in the left atrial appendage, which in retrospect was probably present on the previous exam. 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 Probable graft from this segment extends towards the right coronary artery and appears patent. The aorta at this level measures 4.2 cm maximum transverse diameter, previous 4.1 cm at the same level. More distal thoracic aorta is nondilated. Classic 3 vessel brachiocephalic arterial origin anatomy without proximal stenosis. Scattered calcified plaque in the arch and descending segment. No aortic stenosis.   Small right pleural effusion. No pericardial effusion. Subcentimeter prevascular, AP window, right paratracheal, and precarinal lymph nodes. No hilar adenopathy. A calcified granuloma in the medial basal segment right lower lobe. Lungs otherwise clear. Bridging osteophytes across multiple contiguous levels in the mid and lower thoracic spine.   Review of the MIP images confirms the above findings.   CTA ABDOMEN AND PELVIS   Arterial findings:   Aorta: Scattered calcified plaque. No aneurysm, dissection, or stenosis.   Celiac axis:         Patent.   Superior mesenteric: Patent, with classic distal branch anatomy.   Left renal:          Single, patent   Right renal:         Single, patent   Inferior mesenteric: Patent   Left iliac: Scattered calcified plaque through the common iliac without stenosis. Internal and external iliac  arteries patent.   Right iliac: Scattered calcified plaque through the common iliac. Internal and external iliacs patent.   Venous findings:     Dedicated venous phase imaging not obtained.   Review of the MIP images confirms the above findings.   Nonvascular findings: Unremarkable 2 arterial phase evaluation of liver. Surgical clips in the gallbladder fossa. Unremarkable spleen, adrenal glands. The left kidney is ptotic. 17 mm low-attenuation mid left renal lesion probably cysts but incompletely characterized. No hydronephrosis. Stomach, small bowel, and colon are nondilated. Normal appendix. Scattered diverticula in the ascending, distal descending and sigmoid portions without adjacent inflammatory/ edematous change. There is a small amount of pelvic ascites. Urinary bladder physiologically distended. No free air. Subcentimeter left para-aortic lymph nodes. No mesenteric or pelvic adenopathy. Facet DJD in the lumbar spine most marked L4-5. Bilateral hip osteoarthritis.   IMPRESSION: 1. 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 2. Small right pleural effusion. 3. Trace pelvic ascites. 4.  Scattered colonic diverticula.     Electronically Signed   By: Lucrezia Europe M.D.   On: 11/21/2014 13:32      Cardiac Catheterization Operative Report  Navin Dogan [id number] 4/18/20167:46 AM SPENCER,SARA C, PA-C  Procedure Performed:   1. Selective Coronary Angiography 2. Right Heart Catheterization 3. Saphenous vein graft angiography 4. Aortic root angiogram  Operator: Lauree Chandler, MD  Indication: 54 yo male with prior mechanical aortic valve replacement, single vessel bypass to the RCA, thoracic aorta replacement ([facility]), DM, HTN, HLD who has been found to have severe mitral regurgitation. He has been seen in the CT surgery office by Dr. Roxy Manns and consideration is being given to  mitral valve surgery. Plans in place for right heart cath with coronary angiography today.   Procedure Details: The risks, benefits, complications, treatment options, and expected outcomes were discussed with the patient. The patient and/or family concurred with the proposed plan, giving informed consent. The patient was brought to the cath lab after IV hydration was begun and oral premedication was given. The patient was further sedated with Versed and Fentanyl. There was an IV catheter present in the right antecubital vein. This area was prepped and draped. I then advanced a wire through the IV catheter and exchanged out for a 5 [place] sheath. A balloon tipped catheter was used to perform a right heart catheterization. The right wrist was was prepped and draped in the usual manner. Using the modified Seldinger access technique, a 5/6 French sheath was placed in the right radial artery. 3 mg Verapamil was given through the sheath. 5000 units IV heparin was given. Standard diagnostic catheters were used to perform selective coronary angiography. I could not engage the vein graft. A pigtail catheter was used to perform an aortic root angiogram. I did not cross the mechanical aortic valve. There were no immediate complications. The patient was taken to the recovery area in stable condition.   Hemodynamic Findings: Ao: 116/63     LV: Not measured RA: 16             RV: 76/2/16 PA: 74/30 (mean 47)      PCWP: 24-28 Fick Cardiac Output: 4.38 L/min Fick Cardiac Index: 2.12 L/min/m2 Central Aortic Saturation: 93% Pulmonary Artery Saturation: 60%  Angiographic Findings:  Left main: The LAD and Circumflex arise from different ostia  Left Anterior Descending Artery: Large caliber vessel that courses to the apex. There appears to be a moderate caliber intermediate branch that arises from the proximal LAD. There is a moderate caliber diagonal branch. The distal LAD becomes small in caliber. There is no  obstructive disease.    Circumflex Artery: Large dominant vessel with large obtuse marginal branch. No obstructive disease. I could not selectively engage this vessel despite attempts with multiple catheters but it is found to be patent with non-selective angiography.    Right Coronary Artery: Moderate caliber non-dominant vessel with no obstructive disease. The bypass graft anastomoses in the proximal segment of the native RCA and fills retrograde with injection of the native vessel.   Graft Anatomy:  SVG to RCA: I could not engage this graft. I can see the graft filling retrograde from the native vessel and there is no competitive flow to suggest the graft is patent with antegrade flow. The graft feills retrograde and there is staining noted in what appears to be a dissection plane in the aortic root.   Aortic root angiography: Mild dilatation. No aortic insufficiency.The grafted segment of the aorta is  noted and there appears to be a dissection plane in the ascending aorta (see also CTA report).   Left Ventricular Angiogram: Deferred.   Impression: 1. Patent native coronary arteries with no obstructive disease. Difficult catheter engagement given prior aortic root replacement.   2. Probably occluded vein graft to the RCA (it is seen to fill retrograde from the native RCA and there is no competitive flow) 3. Elevated pressures as above 4. Abnormal findings in aortic root as noted above.    Recommendations: Will continue workup for mitral valve surgery per Dr. Roxy Manns and Dr. [name]. Resume coumadin tonight and resume Lovenox bridging tonight.          Complications:  None; patient tolerated the procedure well.                   Impression:  Patient has stage D severe symptomatic primary might regurgitation. The patient has mitral valve prolapse with a single ruptured primary chordae tendineae attached to the middle scallop of the posterior leaflet. However, the functional anatomy appears  most consistent with type I dysfunction of the mitral valve related to severe annular dilatation. There is severe left atrial enlargement with smoke in the left atrium. 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 Left ventricular systolic function is preserved. The patient does not have significant coronary artery disease. 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  Most of this dissection is actually calcified and probably extraluminal to the aortic graft. Risks associated with surgical intervention will be high because of the patient's previous cardiac surgery, chronic area of dissection, morbid obesity with obstructive sleep apnea, and severe pulmonary hypertension.  Long-term prognosis without surgical intervention would be poor. Less invasive options such as a percutaneously placed mitral clip would likely be palliative at best.  Under the circumstances I favor proceeding with high-risk mitral valve repair through a right mini thoracotomy approach. The patient might benefit from concomitant maze procedure, although the long term success of a maze procedure would be diminished by the chronicity of the patient's atrial fibrillation and severe left atrial enlargement.    Plan:  The patient and his wife were counseled at length regarding the indications, risks and potential benefits of mitral valve repair.  The rationale for elective surgery has been explained, including a comparison between surgery and continued medical therapy with close follow-up.  The high-risk nature of surgery under the patient's circumstances was discussed at length.  The likelihood of successful and durable valve repair has been discussed with particular reference to the findings of their recent echocardiogram.  Based upon these findings and previous  experience, I have quoted them a greater than 90 percent likelihood of successful valve repair.  In the unlikely event that their valve cannot be successfully repaired, we discussed the possibility of replacing the mitral valve using a mechanical prosthesis with the attendant need for long-term anticoagulation versus the alternative of replacing it using a bioprosthetic tissue valve with its potential for late structural valve deterioration and failure, depending upon the patient's longevity.  The patient specifically requests that if the mitral valve must be replaced that it be done using a mechanical valve.   Alternative surgical approaches have been discussed including a comparison between conventional sternotomy and minimally-invasive techniques.  The relative risks and benefits of each have been reviewed as  they pertain to the patient's specific circumstances, and all of their questions have been addressed.  The relative risks and benefits of performing a maze procedure at the time of their surgery was discussed at length, including the expected likelihood of long term freedom from recurrent symptomatic atrial fibrillation and/or atrial flutter.  Specific risks potentially related to the minimally-invasive approach were discussed at length, including but not limited to risk of conversion to full or partial sternotomy, aortic dissection or other major vascular complication, unilateral acute lung injury or pulmonary edema, phrenic nerve dysfunction or paralysis, rib fracture, chronic pain, lung hernia, or lymphocele.  The patient understands and accepts all potential risks of surgery including but not limited to risk of death, stroke or other neurologic complication, myocardial infarction, congestive heart failure, respiratory failure, renal failure, bleeding requiring transfusion and/or reexploration, arrhythmia, infection or other wound complications, pneumonia, pleural and/or pericardial effusion, pulmonary  embolus, aortic dissection or other major vascular complication, or delayed complications related to valve repair or replacement including but not limited to structural valve deterioration and failure, thrombosis, embolization, endocarditis, or paravalvular leak.  All of their questions have been answered.  For a variety of reasons of patient's wants to contemplate timing of surgery before making a final decision, although he plans to proceed with surgery in the relatively near future. We have not made any changes to the patient's current medications at this time. He will contact our office in the near future to discuss a possible surgical date. The patient will return to our office for follow-up approximately one week before his scheduled surgery to make final plans and discuss a plan for bridging him using Lovenox once Coumadin has been stopped.   I spent in excess of 50 minutes during the conduct of this office consultation and >50% of this time involved direct face-to-face encounter with the patient for counseling and/or coordination of their care.    Valentina Gu. Roxy Manns, MD 12/25/2014 4:45 PM

## 2014-12-27 ENCOUNTER — Telehealth: Payer: Self-pay | Admitting: Pharmacist Clinician (PhC)/ Clinical Pharmacy Specialist

## 2014-12-27 ENCOUNTER — Other Ambulatory Visit: Payer: Self-pay | Admitting: *Deleted

## 2014-12-27 MED ORDER — ENOXAPARIN SODIUM 100 MG/ML ~~LOC~~ SOLN: 100.0000 mg | Freq: Two times a day (BID) | SUBCUTANEOUS | Status: DC

## 2014-12-27 NOTE — Telephone Encounter (Signed)
Pt will have MVR soon, needs to be bridged.  Will send prescription to pharmacy to see if Tier exception still in effect.

## 2014-12-28 ENCOUNTER — Other Ambulatory Visit: Payer: Self-pay | Admitting: *Deleted

## 2014-12-28 DIAGNOSIS — I340 Nonrheumatic mitral (valve) insufficiency: Secondary | ICD-10-CM

## 2014-12-28 DIAGNOSIS — I4891 Unspecified atrial fibrillation: Secondary | ICD-10-CM

## 2014-12-29 ENCOUNTER — Telehealth: Payer: Self-pay | Admitting: *Deleted

## 2014-12-29 NOTE — Telephone Encounter (Signed)
Fax receive from [facility] confirming approval for Enoxaparin 100mg  syringe 20/10 through 12/28/15

## 2015-01-01 ENCOUNTER — Telehealth: Payer: Self-pay | Admitting: Cardiology

## 2015-01-01 NOTE — Telephone Encounter (Signed)
New message        Pt has operation on 5/25 and will be on blood thinners   When should he start his Lovenox?  Please give pt a call

## 2015-01-01 NOTE — Telephone Encounter (Signed)
°  1. Which medications need to be refilled? Digoxin-need this right away-completely out of it  2. Which pharmacy is medication to be sent to?[facility]-[phone]  3. Do they need a 30 day or 90 day supply? 90 and refills  4. Would they like a call back once the medication has been sent to the pharmacy? yes

## 2015-01-02 ENCOUNTER — Telehealth: Payer: Self-pay | Admitting: Pharmacist Clinician (PhC)/ Clinical Pharmacy Specialist

## 2015-01-02 ENCOUNTER — Other Ambulatory Visit: Payer: Self-pay

## 2015-01-02 ENCOUNTER — Other Ambulatory Visit: Payer: Self-pay | Admitting: *Deleted

## 2015-01-02 MED ORDER — DIGOXIN 250 MCG PO TABS: 0.2500 mg | ORAL_TABLET | Freq: Every day | ORAL | Status: DC

## 2015-01-02 NOTE — Telephone Encounter (Signed)
Enoxaprin Dosing Schedule  Enoxparin dose: 100 mg  Date  Warfarin Dose (evenings) Enoxaprin Dose  5-19 6 5  mg (1 tab)   5-20 5 0   5-21 4 0 8am     8pm  5-22 3 0 8am     8pm  5-23 2 0 8am     8pm  5-24 1 0 8am  5-25 Procedure     1 Hospital to dose Hospital to dose   2     3     4     5      6

## 2015-01-08 ENCOUNTER — Ambulatory Visit (INDEPENDENT_AMBULATORY_CARE_PROVIDER_SITE_OTHER): Payer: Medicare HMO | Admitting: *Deleted

## 2015-01-08 DIAGNOSIS — Z5181 Encounter for therapeutic drug level monitoring: Secondary | ICD-10-CM | POA: Diagnosis not present

## 2015-01-08 DIAGNOSIS — I482 Chronic atrial fibrillation, unspecified: Secondary | ICD-10-CM

## 2015-01-08 DIAGNOSIS — Z952 Presence of prosthetic heart valve: Secondary | ICD-10-CM

## 2015-01-08 DIAGNOSIS — I4891 Unspecified atrial fibrillation: Secondary | ICD-10-CM | POA: Diagnosis not present

## 2015-01-08 DIAGNOSIS — Z954 Presence of other heart-valve replacement: Secondary | ICD-10-CM

## 2015-01-08 LAB — POCT INR: INR: 1.5

## 2015-01-10 ENCOUNTER — Telehealth: Payer: Self-pay | Admitting: Cardiovascular Disease

## 2015-01-11 ENCOUNTER — Other Ambulatory Visit: Payer: Self-pay | Admitting: *Deleted

## 2015-01-12 ENCOUNTER — Telehealth: Payer: Self-pay | Admitting: Internal Medicine

## 2015-01-12 NOTE — Telephone Encounter (Signed)
Pts wife very rude over the phone wanting to know why it took so long to call him back. Explained the process of how calls are triaged and that staff does take lunch. Spoke with pt and he was in pain earlier because he usually takes pain medicine and he vomited it up and got in pain. He has since taken another pill, eaten a banana and cheese and kept that down. Reviewed with pt the brat diet and encouraged him to stay away from dairy products. Pt verbalized understanding and will call back if he continues to have problems.

## 2015-01-16 ENCOUNTER — Ambulatory Visit (INDEPENDENT_AMBULATORY_CARE_PROVIDER_SITE_OTHER): Payer: Medicare HMO | Admitting: Thoracic Surgery (Cardiothoracic Vascular Surgery)

## 2015-01-16 ENCOUNTER — Encounter: Payer: Self-pay | Admitting: Thoracic Surgery (Cardiothoracic Vascular Surgery)

## 2015-01-16 VITALS — BP 135/77 | HR 86 | Resp 16 | Ht 61.5 in | Wt 237.0 lb

## 2015-01-16 DIAGNOSIS — I340 Nonrheumatic mitral (valve) insufficiency: Secondary | ICD-10-CM | POA: Diagnosis not present

## 2015-01-16 DIAGNOSIS — I482 Chronic atrial fibrillation, unspecified: Secondary | ICD-10-CM

## 2015-01-16 DIAGNOSIS — Z952 Presence of prosthetic heart valve: Secondary | ICD-10-CM

## 2015-01-16 DIAGNOSIS — Z954 Presence of other heart-valve replacement: Secondary | ICD-10-CM | POA: Diagnosis not present

## 2015-01-16 NOTE — Progress Notes (Signed)
[address]       [place] [address]             [phone]     CARDIOTHORACIC SURGERY OFFICE NOTE  Referring Provider is [name], MD PCP is [name], PA-C   HPI:  Patient returns to the office today for follow-up of severe symptomatic primary mitral regurgitation. He was originally seen in consultation on 11/13/2014 and he was seen more recently on 12/25/2014. At that time we made plans to proceed with minimally invasive mitral valve repair next week. The patient returns to the office today for follow-up prior to surgery. He reports no new problems or complaints over the last few weeks. He continues to get short of breath with very low level activity. He has had some intermittent atypical chest pain. He has not had resting shortness of breath but he cannot lie flat in bed. The remainder of his review of systems is unchanged from previously.   Current Outpatient Prescriptions  Medication Sig Dispense Refill  . allopurinol (ZYLOPRIM) 300 MG tablet Take 300 mg by mouth daily.    Marland Kitchen atenolol (TENORMIN) 100 MG tablet Take 100 mg by mouth 2 (two) times daily.    Marland Kitchen b complex vitamins tablet Take 1 tablet by mouth daily.      . busPIRone (BUSPAR) 7.5 MG tablet Take 7.5 mg by mouth 2 (two) times daily.     . Calcium-Magnesium-Zinc (CAL-MAG-ZINC PO) Take by mouth daily.      Marland Kitchen CARTIA XT 120 MG 24 hr capsule Take 120 mg by mouth daily.     . clindamycin (CLEOCIN) 300 MG capsule Take 2 capsules by mouth one hour prior to dental appointment. 2 capsule 1  . digoxin (LANOXIN) 0.25 MG tablet Take 1 tablet (0.25 mg total) by mouth daily. 90 tablet 3  . furosemide (LASIX) 40 MG tablet Take 20-40 mg by mouth daily as needed for fluid.    . methocarbamol (ROBAXIN) 500 MG tablet Take 500 mg by mouth 3 (three) times daily.     . ONE TOUCH ULTRA TEST test strip     . oxyCODONE (OXY IR/ROXICODONE) 5 MG immediate release tablet Take 5 mg by mouth as needed.     .  potassium chloride (K-DUR) 10 MEQ tablet Take 10 mEq by mouth daily.    . temazepam (RESTORIL) 15 MG capsule Take 15 mg by mouth at bedtime as needed for sleep.    Marland Kitchen warfarin (COUMADIN) 5 MG tablet Take 1 to 1.5 tablets by mouth daily as directed by coumadin clinic (Patient taking differently: Take 5-7.5 mg by mouth See admin instructions. 7.5mg  on Wednesdays, 5mg  all other days) 120 tablet 1  . enoxaparin (LOVENOX) 100 MG/ML injection Inject 1 mL (100 mg total) into the skin every 12 (twelve) hours. (Patient not taking: Reported on 01/16/2015) 20 Syringe 0   No current facility-administered medications for this visit.      Physical Exam:   BP 135/77 mmHg  Pulse 86  Resp 16  Ht 5' 1.5" (1.562 m)  Wt 237 lb (107.502 kg)  BMI 44.06 kg/m2  SpO2 96%  General:  Obese in no acute distress  Chest:   Clear to auscultation  CV:   Irregular rate and rhythm with mechanical heart valve sounds and prominent systolic murmur  Incisions:  n/a  Abdomen:  Soft and nontender  Extremities:  Warm and well-perfused with moderate chronic bilateral lower extremity edema  Diagnostic Tests:  n/a    Impression:  Patient has stage D severe symptomatic primary might regurgitation. The patient has mitral valve prolapse with a single ruptured primary chordae tendineae attached to the middle scallop of the posterior leaflet. 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 There is severe left atrial enlargement with smoke in the left atrium. 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 Left ventricular systolic function is preserved. The patient does not have significant coronary artery disease. 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 Most  of this dissection is actually calcified and probably extraluminal to the aortic graft. Risks associated with surgical intervention will be high because of the patient's previous cardiac surgery, chronic area of dissection, morbid obesity with obstructive sleep apnea, and severe pulmonary hypertension. Long-term prognosis without surgical intervention would be poor. Less invasive options such as a percutaneously placed mitral clip would likely be palliative at best. Under the circumstances I favor proceeding with high-risk mitral valve repair through a right mini thoracotomy approach. The patient might benefit from concomitant maze procedure, although the long term success of a maze procedure would be diminished by the chronicity of the patient's atrial fibrillation and severe left atrial enlargement.    Plan:  The patient and his wife were counseled at length regarding the indications, risks and potential benefits of mitral valve repair. The rationale for elective surgery has been explained, including a comparison between surgery and continued medical therapy with close follow-up. The high-risk nature of surgery under the patient's circumstances was discussed at length. The likelihood of successful and durable valve repair has been discussed with particular reference to the findings of their recent echocardiogram. Based upon these findings and previous experience, I have quoted them a greater than 90 percent likelihood of successful valve repair. In the unlikely event that their valve cannot be successfully repaired, we discussed the possibility of replacing the mitral valve using a mechanical prosthesis with the attendant need for long-term anticoagulation versus the alternative of replacing it using a bioprosthetic tissue valve with its potential for late structural valve deterioration and failure, depending upon the patient's longevity. The patient specifically requests that if the mitral valve  must be replaced that it be done using a mechanical valve. Alternative surgical approaches have been discussed including a comparison between conventional sternotomy and minimally-invasive techniques. The relative risks and benefits of each have been reviewed as they pertain to the patient's specific circumstances, and all of their questions have been addressed. The relative risks and benefits of performing a maze procedure at the time of their surgery was discussed at length, including the expected likelihood of long term freedom from recurrent symptomatic atrial fibrillation and/or atrial flutter. The patient does not wish for [place] to attempt a maze procedure because of the small but potentially significant chance that a maze procedure might increase the need for permanent pacemaker placement.  Specific risks potentially related to the minimally-invasive approach were discussed at length, including but not limited to risk of conversion to full or partial sternotomy, aortic dissection or other major vascular complication, unilateral acute lung injury or pulmonary edema, phrenic nerve dysfunction or paralysis, rib fracture, chronic pain, lung hernia, or lymphocele. The patient understands and accepts all potential risks of surgery including but not limited to  risk of death, stroke or other neurologic complication, myocardial infarction, congestive heart failure, respiratory failure, renal failure, bleeding requiring transfusion and/or reexploration, arrhythmia, infection or other wound complications, pneumonia, pleural and/or pericardial effusion, pulmonary embolus, aortic dissection or other major vascular complication, or delayed complications related to valve repair or replacement including but not limited to structural valve deterioration and failure, thrombosis, embolization, endocarditis, or paravalvular leak.  Expectations for the patient's postoperative convalescence have been discussed in detail.  We  plan to proceed with surgery on Wednesday, 01/24/2015. The patient has been instructed to stop taking Coumadin after he takes his dose tomorrow. He has been given a prescription for Lovenox injections to be utilized as a bridge between then and the time of surgery. All of his questions have been addressed.  I spent in excess of 20 minutes during the conduct of this office consultation and >50% of this time involved direct face-to-face encounter with the patient for counseling and/or coordination of their care.   Valentina Gu. Roxy Manns, MD 01/16/2015 5:27 PM

## 2015-01-16 NOTE — Patient Instructions (Signed)
Patient has been instructed to stop taking coumadin after you take your dose on Wednesday 5/18 (tomorrow)  Begin taking lovenox injections on Friday 5/20  Stop taking lovenox after you take your 2nd dose on Monday 5/23.  Do not take lovenox on Tuesday 5/24  Patient should continue taking all other medications without change through the day before surgery.  Patient should have nothing to eat or drink after midnight the night before surgery.  On the morning of surgery patient should take only Cartia XT and Tenormin with a sip of water.

## 2015-01-22 ENCOUNTER — Ambulatory Visit (HOSPITAL_COMMUNITY): Admission: RE | Admit: 2015-01-22 | Payer: Medicare HMO | Source: Ambulatory Visit

## 2015-01-22 ENCOUNTER — Ambulatory Visit (HOSPITAL_COMMUNITY)
Admission: RE | Admit: 2015-01-22 | Discharge: 2015-01-22 | Disposition: A | Discharge status: Home / Self Care | Payer: Medicare HMO | Source: Ambulatory Visit | Attending: Thoracic Surgery (Cardiothoracic Vascular Surgery) | Admitting: Thoracic Surgery (Cardiothoracic Vascular Surgery)

## 2015-01-22 ENCOUNTER — Telehealth: Payer: Self-pay | Admitting: *Deleted

## 2015-01-22 ENCOUNTER — Encounter (HOSPITAL_COMMUNITY)
Admission: RE | Admit: 2015-01-22 | Discharge: 2015-01-22 | Disposition: A | Discharge status: Home / Self Care | Payer: Medicare HMO | Source: Ambulatory Visit | Attending: Thoracic Surgery (Cardiothoracic Vascular Surgery) | Admitting: Thoracic Surgery (Cardiothoracic Vascular Surgery)

## 2015-01-22 ENCOUNTER — Encounter (HOSPITAL_COMMUNITY): Payer: Self-pay

## 2015-01-22 ENCOUNTER — Other Ambulatory Visit: Payer: Self-pay | Admitting: Thoracic Surgery (Cardiothoracic Vascular Surgery)

## 2015-01-22 ENCOUNTER — Other Ambulatory Visit: Payer: Self-pay | Admitting: *Deleted

## 2015-01-22 VITALS — BP 135/70 | HR 80 | Temp 98.3°F | Resp 18 | Ht 61.5 in | Wt 244.8 lb

## 2015-01-22 DIAGNOSIS — I4891 Unspecified atrial fibrillation: Secondary | ICD-10-CM

## 2015-01-22 DIAGNOSIS — I059 Rheumatic mitral valve disease, unspecified: Secondary | ICD-10-CM

## 2015-01-22 DIAGNOSIS — Z01818 Encounter for other preprocedural examination: Secondary | ICD-10-CM | POA: Insufficient documentation

## 2015-01-22 DIAGNOSIS — I340 Nonrheumatic mitral (valve) insufficiency: Secondary | ICD-10-CM | POA: Insufficient documentation

## 2015-01-22 DIAGNOSIS — I517 Cardiomegaly: Secondary | ICD-10-CM | POA: Insufficient documentation

## 2015-01-22 HISTORY — DX: Gastro-esophageal reflux disease without esophagitis: K21.9

## 2015-01-22 HISTORY — DX: Anxiety disorder, unspecified: F41.9

## 2015-01-22 LAB — BLOOD GAS, ARTERIAL
Acid-Base Excess: 3.9 mmol/L — ABNORMAL HIGH (ref 0.0–2.0)
BICARBONATE: 27.9 meq/L — AB (ref 20.0–24.0)
Drawn by: 428831
FIO2: 0.21 %
O2 Saturation: 96.3 %
PCO2 ART: 41.5 mmHg (ref 35.0–45.0)
PH ART: 7.442 (ref 7.350–7.450)
Patient temperature: 98.6
TCO2: 29.2 mmol/L (ref 0–100)
pO2, Arterial: 79.9 mmHg — ABNORMAL LOW (ref 80.0–100.0)

## 2015-01-22 LAB — COMPREHENSIVE METABOLIC PANEL
ALBUMIN: 2.9 g/dL — AB (ref 3.5–5.0)
ALK PHOS: 47 U/L (ref 38–126)
ALT: 17 U/L (ref 17–63)
AST: 21 U/L (ref 15–41)
Anion gap: 8 (ref 5–15)
BUN: 15 mg/dL (ref 6–20)
CHLORIDE: 104 mmol/L (ref 101–111)
CO2: 26 mmol/L (ref 22–32)
Calcium: 8.6 mg/dL — ABNORMAL LOW (ref 8.9–10.3)
Creatinine, Ser: 0.77 mg/dL (ref 0.61–1.24)
GFR calc non Af Amer: >60 mL/min (ref >60)
Glucose, Bld: 101 mg/dL — ABNORMAL HIGH (ref 65–99)
Potassium: 4.4 mmol/L (ref 3.5–5.1)
SODIUM: 138 mmol/L (ref 135–145)
Total Bilirubin: 1 mg/dL (ref 0.3–1.2)
Total Protein: 5.6 g/dL — ABNORMAL LOW (ref 6.5–8.1)

## 2015-01-22 LAB — CBC
HEMATOCRIT: 43.9 % (ref 39.0–52.0)
HEMOGLOBIN: 14 g/dL (ref 13.0–17.0)
MCH: 26.5 pg (ref 26.0–34.0)
MCHC: 31.9 g/dL (ref 30.0–36.0)
MCV: 83.1 fL (ref 78.0–100.0)
PLATELETS: 147 10*3/uL — AB (ref 150–400)
RBC: 5.28 MIL/uL (ref 4.22–5.81)
RDW: 16.5 % — AB (ref 11.5–15.5)
WBC: 8.2 10*3/uL (ref 4.0–10.5)

## 2015-01-22 LAB — URINALYSIS, ROUTINE W REFLEX MICROSCOPIC
Bilirubin Urine: NEGATIVE
GLUCOSE, UA: NEGATIVE mg/dL
Hgb urine dipstick: NEGATIVE
KETONES UR: NEGATIVE mg/dL
Leukocytes, UA: NEGATIVE
NITRITE: NEGATIVE
PH: 6 (ref 5.0–8.0)
Protein, ur: NEGATIVE mg/dL
Specific Gravity, Urine: 1.006 (ref 1.005–1.030)
Urobilinogen, UA: 0.2 mg/dL (ref 0.0–1.0)

## 2015-01-22 LAB — SURGICAL PCR SCREEN
MRSA, PCR: POSITIVE — AB
STAPHYLOCOCCUS AUREUS: POSITIVE — AB

## 2015-01-22 LAB — GLUCOSE, CAPILLARY: Glucose-Capillary: 110 mg/dL — ABNORMAL HIGH (ref 65–99)

## 2015-01-22 LAB — PROTIME-INR
INR: 1.36 (ref 0.00–1.49)
Prothrombin Time: 16.8 seconds — ABNORMAL HIGH (ref 11.6–15.2)

## 2015-01-22 LAB — APTT: APTT: 41 s — AB (ref 24–37)

## 2015-01-22 NOTE — Telephone Encounter (Signed)
Pt wife [name]) called stating she was legally blind and does not drive and her husband was having open heart surgery here on Wednesday.  [name] was inquiring about being able to stay in ICU with pt as he is her caregiver.  NCM contacted Ray Watlington, chaplin to inquire about services.  Ray educated me on [place], but stated that pt will have to provide transportation to and fro.  NCM relay information back to [place], which did not meet her approval; she asked for number of pt advocate.  NCM gave her [phone]- pt states she has been given that number and no one ever answers; she requested that I call the number to make sure someone is there before she calls back.  NCM called and indeed, no answer.  [name] then demanded physical address of office.  NCM gave her location of black box ([place]).  [name] still verbally insulting.  NCM called DD ([employer] [employer]) of [phone] for guidance; left message on voicemail.

## 2015-01-22 NOTE — H&P (Signed)
[address]       Lance Cook,Lance Cook [address]             [phone]          CARDIOTHORACIC SURGERY HISTORY AND PHYSICAL EXAM   Referring Provider is Lance Breeding, MD PCP is Lance Dials, PA-C  Chief Complaint  Patient presents with  . Mitral Regurgitation    Surgical eval, TEE 10/11/14  . 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 At the time the patient lived in [place], where the diagnosis was made. The patient traveled to [facility] where he underwent urgent surgery by Lance Cook at [facility]. According to the patient his aortic valve and entire ascending thoracic aorta was replaced using a 25 mm St. Jude mechanical valve conduit. Single-vessel coronary artery bypass grafting to the right coronary artery was performed at the time because of acute myocardial infarction. He does not recall being told whether or not he had a bicuspid aortic valve or evidence for other valvular heart disease at the time. He recovered from the surgery uneventfully. He does not recall ever being told about any residual chronic aortic dissection or false aneurysm. Over the past 25 years the patient has developed progressive symptoms of exertional shortness of breath, orthopnea, and severe bilateral lower extremity edema consistent with chronic diastolic congestive heart failure. He developed chronic persistent atrial  fibrillation for which he has been managed on medical therapy for at least 15 years. He suffered a minor stroke and another brief transient ischemic attack in the distant past, and both were associated with times when Coumadin therapy was subtherapeutic. The patient and his wife moved to [place] in 2008, and he has been followed for the last several years by Dr. [name]. Recent follow-up transthoracic echocardiogram demonstrated the presence of mitral valve prolapse with possible ruptured chordae tendineae, and subsequent transesophageal echocardiogram confirmed similar findings with likely severe mitral regurgitation. The patient was referred for surgical consultation.  Patient returns to the office today for follow-up of severe symptomatic primary mitral regurgitation. He was originally seen in consultation on 11/13/2014 and he was seen more recently on 12/25/2014. At that time we made plans to proceed with minimally invasive mitral valve repair next week. The patient returns to the office today for follow-up prior to surgery. He reports no new problems or complaints over the last few weeks. He continues to get short of breath with very low level activity. He has had some intermittent atypical chest pain. He has not had resting shortness of breath but he cannot lie flat in bed. The remainder of his review of systems is unchanged from previously.  The patient is married and lives locally in [place] with his wife. He describes stable symptoms of exertional shortness of breath consistent with chronic diastolic congestive heart failure, New [place] Heart Association function class III. He states that he has been limited by exertional shortness of breath for many years. His symptoms have not progressed recently, but  the patient remains significantly limited in that he gets short of breath and tired with relatively mild activity. He denies any symptoms of resting shortness of breath, PND, chest tightness or  chest pressure, dizzy spells, or syncope. His physical activity and mobility are also severely limited by chronic pain in his back related to spinal stenosis. The patient previously worked as an Dietitian, but he has been on full disability for several years because severe chronic back pain and inability to stand on his feet. The patient developed severe chronic sleep apnea for which he has been CPAP-dependent for years. He has severe chronic bilateral lower extremity edema with occasional spontaneous bleeding from ruptured varicose veins, recurrent cellulitis, and chronic relapsing spontaneous transcutaneous drainage of serous fluid. He was hospitalized in August 2015 with fevers and sepsis felt related to an acute exacerbation of cellulitis in his leg. Symptoms resolved fairly quickly with antibiotic therapy and blood cultures were negative at that time.           Past Medical History  Diagnosis Date  . Hyperlipidemia   . Hypertension   . Arthritis   . Leg pain 06/28/2010  . Hiatal hernia   . Atrial fibrillation     chronic persistent  . Reflux 11/06/09  . Myocardial infarction age 58  . Varicose veins   . Obesity   . Peripheral vascular disease   . Gout   . CAD (coronary artery disease)     Old scar inferior wall myoview, 10/2009 EF 52%  . Chronic LBP 10/08/2011  . Impaired glucose tolerance 10/08/2011  . Erectile dysfunction 10/08/2011  . Testicular cancer   . Bell's palsy   . TIA (transient ischemic attack)     age 12  . CVA (cerebral infarction)     54yo  . OSA (obstructive sleep apnea)     CPAP  . History of colon polyps   . Anemia   . Hypogonadism male 04/08/2012  . Aortic aneurysm   . Aortic aneurysm and dissection   . 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     1988 - Dr Lance Cook at [facility] in [place], [place]  . Ascending aortic dissection 07/14/2008    Localized dissection of ascending aorta noted on CTA in 2009 and  stable on CTA in 2011  . Severe mitral regurgitation 10/11/2014  . Morbid obesity 04/02/2014  . Diabetes mellitus     diet controlled  . DIABETES MELLITUS, TYPE II 11/01/2009    Qualifier: Diagnosis of  By: Lance Amen MD, [name]    . Dysrhythmia   . CHF (congestive heart failure)   . Shortness of breath dyspnea     with exertion  . Anxiety   . GERD (gastroesophageal reflux disease)     not needing medication at thhis time- 01/22/15    Past Surgical History  Procedure Laterality Date  . Laser ablation  03/06/2010    left leg  . Cardiac valve replacement      [id number]  . Cholecystectomy  2011  . Tonsillectomy  1967  . Otoplasty      bilateral  . Orchiectomy  age 82    testicular cancer  . Tee without cardioversion N/A 10/11/2014    Procedure: TRANSESOPHAGEAL ECHOCARDIOGRAM (TEE);  Surgeon: Pixie Casino, MD;  Location: [facility] ENDOSCOPY;  Service: Cardiovascular;  Laterality: N/ADeneen Harts procedure  1988    25 mm St Jude mechanical valve conduit - Dr Lance Cook at [facility]  in [place], [place]  . Coronary artery bypass graft    . Aortic truck      Family History  Problem Relation Age of Onset  . Heart disease Mother   . Throat cancer Mother   . Diabetes Father   . Stroke Other   . Hypertension Other   . Cancer Maternal Aunt     lung, brain  . Cancer Maternal Uncle     brain aneurysm    Social History History  Substance Use Topics  . Smoking status: Light Tobacco Smoker    Types: Cigars  . Smokeless tobacco: Never Used     Comment: about 3 yearly  . Alcohol Use: 0.0 oz/week    0 Standard drinks or equivalent per week     Comment: social    Prior to Admission medications   Medication Sig Start Date End Date Taking? Authorizing Provider  allopurinol (ZYLOPRIM) 300 MG tablet Take 300 mg by mouth daily. 11/17/12  Yes Biagio Borg, MD  atenolol (TENORMIN) 100 MG tablet Take 100 mg by mouth 2 (two) times daily. 04/14/14  Yes Lance Breeding, MD  b complex  vitamins tablet Take 1 tablet by mouth daily.     Yes Historical Provider, MD  busPIRone (BUSPAR) 7.5 MG tablet Take 7.5 mg by mouth 2 (two) times daily.    Yes Historical Provider, MD  Calcium-Magnesium-Zinc (CAL-MAG-ZINC PO) Take by mouth daily.     Yes Historical Provider, MD  CARTIA XT 120 MG 24 hr capsule Take 120 mg by mouth daily.  09/27/14  Yes Historical Provider, MD  clindamycin (CLEOCIN) 300 MG capsule Take 2 capsules by mouth one hour prior to dental appointment. 11/14/14  Yes Lenn Cal, DDS  digoxin (LANOXIN) 0.25 MG tablet Take 1 tablet (0.25 mg total) by mouth daily. 01/02/15  Yes Lance Breeding, MD  enoxaparin (LOVENOX) 100 MG/ML injection Inject 1 mL (100 mg total) into the skin every 12 (twelve) hours. 12/27/14  Yes Lance Breeding, MD  furosemide (LASIX) 40 MG tablet Take 20-40 mg by mouth daily as needed for fluid. 10/08/11  Yes Biagio Borg, MD  methocarbamol (ROBAXIN) 500 MG tablet Take 500 mg by mouth 3 (three) times daily.  10/08/11  Yes Biagio Borg, MD  ONE TOUCH ULTRA TEST test strip  09/04/14  Yes Historical Provider, MD  oxyCODONE (OXY IR/ROXICODONE) 5 MG immediate release tablet Take 5 mg by mouth every 4 (four) hours as needed for moderate pain.  09/08/14  Yes Historical Provider, MD  potassium chloride (K-DUR) 10 MEQ tablet Take 10 mEq by mouth 2 (two) times daily.    Yes Historical Provider, MD  warfarin (COUMADIN) 5 MG tablet Take 1 to 1.5 tablets by mouth daily as directed by coumadin clinic Patient taking differently: Take 5-7.5 mg by mouth See admin instructions. 7.5mg  on Wednesdays, 5mg  all other days 10/14/14  Yes Lance Breeding, MD  temazepam (RESTORIL) 15 MG capsule Take 15 mg by mouth at bedtime as needed for sleep.    Historical Provider, MD    Allergies  Allergen Reactions  . Penicillins     REACTION: Hives  . Adhesive [Tape] Other (See Comments)    Shin irritation      Review of Systems:  General:good appetite,  stable energy, + weight gain, no weight loss, no fever Cardiac:no chest pain with exertion, no chest pain at rest, + SOB with exertion, no resting SOB, no PND, + orthopnea, no palpitations, + arrhythmia, + atrial fibrillation, +  LE edema, no dizzy spells, no syncope Respiratory:+ chronic exertional shortness of breath, no home oxygen, no productive cough, no dry cough, no bronchitis, no wheezing, no hemoptysis, no asthma, no pain with inspiration or cough, + sleep apnea, + CPAP at night GI:no difficulty swallowing, no reflux, no frequent heartburn, + hiatal hernia, no abdominal pain, no constipation, + chronic diarrhea, no hematochezia, no hematemesis, no melena GU:no dysuria, no frequency, no urinary tract infection, no hematuria, no enlarged prostate, no kidney stones, no kidney disease Vascular:no pain suggestive of claudication, no pain in feet, no leg cramps, + varicose veins, no DVT, no non-healing foot ulcer, + severe chronic venous insufficiency with chronic lymphedema, relapsing cellulitis and spontaneous bleeding from ruptured varicose veins Neuro:+ stroke, + TIA's, NO seizures, NO headaches, NO temporary blindness one eye, NO slurred speech, NO peripheral neuropathy, + chronic pain, no instability of gait, no memory/cognitive dysfunction Musculoskeletal:+ arthritis, no joint swelling, no myalgias, no difficulty walking, decreased mobility  Skin:+ rash, no itching, + skin infections, no pressure sores or ulcerations Psych:no anxiety, no depression, no nervousness, no unusual recent stress Eyes:no blurry vision, no floaters, no recent vision  changes, + wears glasses or contacts ENT:no hearing loss, no loose or painful teeth, no dentures, last saw dentist > 3 years ago Hematologic:+ easy bruising, no abnormal bleeding, no clotting disorder, no frequent epistaxis Endocrine:+ diabetes, routinely checks CBG's at home, Hgb A1c reportedly < 6.0   Physical Exam:  BP 110/71 mmHg  Pulse 78  Resp 20  Ht 5\' 1"  (1.549 m)  Wt 244 lb (110.678 kg)  BMI 46.13 kg/m2  SpO2 95% General:Morbidly obese, chronically ill-appearing HEENT:Unremarkable  Neck:no JVD, no bruits, no adenopathy  Chest:clear to auscultation, symmetrical breath sounds, no wheezes, no rhonchi  CV:RRR, mechanical heart sounds, prominent IV/VI systolic murmur heard best LLSB Abdomen:soft, non-tender, no masses  Extremities:warm, well-perfused, pulses not palpable, + severe bilateral LE edema with chronic skin changes and spontaneous drainage of serous fluid, no active cellulitis Rectal/GUDeferred Neuro:Grossly non-focal and symmetrical throughout Skin:Clean and dry, no rashes, no breakdown   Diagnostic Tests:  CT ANGIOGRAPHY CHEST WITH CONTRAST  Technique: Multidetector CT imaging of the chest was performed using the standard protocol during bolus administration of intravenous contrast. Multiplanar CT image reconstructions including MIPs were obtained to evaluate the vascular anatomy.  Contrast: 100 ml Omnipaque 350.  Comparison: Plain films chest 10/19/2009 and CT chest  07/14/2008.  Findings: No pulmonary embolus is identified. Aortic valve replacement is noted. Prominence of the main pulmonary artery 3.9 cm is unchanged. Also unchanged is the size of the aorta at the level of the RCA graft origination site which measures 4.1 x 3.6 cm. Short segment of dissection along the ascending aorta at its root appears unchanged.  There is no pleural or pericardial effusion. Cardiomegaly is noted. The patient has some small mediastinal lymph nodes which are unchanged including a right paratracheal node on image 40 measuring 1 cm.  Lungs demonstrate some dependent atelectatic change. A few small calcified nodules are unchanged. No consolidative process, noncalcified nodule or mass is identified.  Incidentally imaged upper abdomen is unremarkable. No focal bony abnormality.  Review of the MIP images confirms the above findings.  IMPRESSION:  1. Negative for pulmonary embolus or acute abnormality. 2. Unchanged short segment of dissection of the ascending aorta. 3. Cardiomegaly.  Provider: Jeanell Sparrow     Transthoracic Echocardiography  Patient:  Lance Cook, Lance Cook MR #:    [id number] Study Date: 09/11/2014 Gender:   Jerilynn Mages  Age:    43 Height:   154.9 cm Weight:   104.8 kg BSA:    2.19 m^2 Pt. Status: Room:  ATTENDING  Jeneen Rinks [name] ORDERING   Jeneen Rinks [name] REFERRING  Lance Cook PERFORMING  [facility], Outpatient SONOGRAPHER [facility], RDCS  cc:  ------------------------------------------------------------------- LV EF: 55% -  60%  ------------------------------------------------------------------- Indications:   Atrial Fibrillation (I48.91).  ------------------------------------------------------------------- History:  PMH: Thoracic Aorta Repair with Aortic Valve Replacement, Obstructive Sleep Apnea with CPAP, Testicular Cancer, Bells Palsy, Edema, History of Substance Abuse.  Atrial fibrillation. Coronary artery disease. Stroke. Transient ischemic attack. PMH:  Myocardial infarction. Risk factors: Hypertension. Diabetes mellitus. Dyslipidemia.  ------------------------------------------------------------------- Study Conclusions  - Left ventricle: The cavity size was normal. Wall thickness was increased in a pattern of mild LVH. Systolic function was normal. The estimated ejection fraction was in the range of 55% to 60%. Wall motion was normal; there were no regional wall motion abnormalities. Doppler parameters are consistent with restrictive physiology, indicative of decreased left ventricular diastolic compliance and/or increased left atrial pressure. - Aortic valve: There was a mechanical aortic valve. There was no significant regurgitation. Mean gradient across the prosthetic valve is not significantly elevated. Mean gradient (S): 19 mm Hg. - Mitral valve: There appears to be a ruptured secondary chord that can be seen prolapsing across the mitral valve. There was eccentric, posteriorly-directed MR. It was at least moderate, cannot rule out severe. - Left atrium: The atrium was severely dilated. - Right ventricle: The cavity size was normal. Systolic function was normal. - Right atrium: The atrium was mildly dilated. - Atrial septum: The septum bowed from left to right, consistent with increased left atrial pressure. - Tricuspid valve: Peak RV-RA gradient (S): 58 mm Hg. - Pulmonary arteries: PA peak pressure: 73 mm Hg (S). - Systemic veins: IVC measured 2.7 cm with < 50% respirophasic variation, suggesting RA pressure 15 mmHg.  Impressions:  - The patient appeared to be in atrial fibrillation. Normal LV size with mild LV hypertrophy, EF 55-60%. Restrictive diastolic function. Mechanical aortic valve appeared to function normally. There appeared to be a ruptured probably secondary chord that prolapsed  across the mitral valve. There was eccentric, posteriorly-directed MR that was at least moderate, cannot rule out severe. Severe pulmonary hypertension. Normal RV size and systolic function. Suggest TEE to evaluate mitral valve.  Transthoracic echocardiography. M-mode, complete 2D, spectral Doppler, and color Doppler. Birthdate: Patient birthdate: Jun 23, 1961. Age: Patient is 54 yr old. Sex: Gender: male. BMI: 43.6 kg/m^2. Blood pressure:   138/85 Patient status: Outpatient. Study date: Study date: 09/11/2014. Study time: 11:12 AM. Location: Echo laboratory.  -------------------------------------------------------------------  ------------------------------------------------------------------- Left ventricle: The cavity size was normal. Wall thickness was increased in a pattern of mild LVH. Systolic function was normal. The estimated ejection fraction was in the range of 55% to 60%. Wall motion was normal; there were no regional wall motion abnormalities. Doppler parameters are consistent with restrictive physiology, indicative of decreased left ventricular diastolic compliance and/or increased left atrial pressure.  ------------------------------------------------------------------- Aortic valve: There was a mechanical aortic valve. Doppler: There was no significant regurgitation. Mean gradient across the prosthetic valve is not significantly elevated.  VTI ratio of LVOT to aortic valve: 0.26. Indexed valve area (VTI): 0.45 cm^2/m^2. Indexed valve area (Vmax): 0.35 cm^2/m^2. Mean velocity ratio of LVOT to aortic valve: 0.21. Indexed valve area (Vmean): 0.36 cm^2/m^2.  Mean gradient (S): 19 mm Hg. Peak gradient (S): 36 mm Hg.  ------------------------------------------------------------------- Aorta: Aortic root: The aortic root was normal in  size. Ascending aorta: The ascending aorta was normal in  size.  ------------------------------------------------------------------- Mitral valve: There appears to be a ruptured secondary chord that can be seen prolapsing across the mitral valve. There was eccentric, posteriorly-directed MR. It was at least moderate, cannot rule out severe. Mildly calcified leaflets . Doppler: There was no evidence for stenosis.   Peak gradient (D): 7 mm Hg.  ------------------------------------------------------------------- Left atrium: LA Volume/BSA= 145.3 ml/m2. The atrium was severely dilated.  ------------------------------------------------------------------- Atrial septum: The septum bowed from left to right, consistent with increased left atrial pressure.  ------------------------------------------------------------------- Right ventricle: The cavity size was normal. Systolic function was normal.  ------------------------------------------------------------------- Pulmonic valve:  Structurally normal valve.  Cusp separation was normal. Doppler: Transvalvular velocity was within the normal range. There was no regurgitation.  ------------------------------------------------------------------- Tricuspid valve:  Doppler: There was mild regurgitation.  ------------------------------------------------------------------- Right atrium: The atrium was mildly dilated.  ------------------------------------------------------------------- Systemic veins: IVC measured 2.7 cm with < 50% respirophasic variation, suggesting RA pressure 15 mmHg.  ------------------------------------------------------------------- Measurements  Left ventricle              Value     Reference LV ID, ED, PLAX chordal      (H)   52.4 mm    43 - 52 LV ID, ES, PLAX chordal          31.7 mm    23 - 38 LV fx shortening, PLAX chordal      40  %    >=29 LV PW thickness, ED            14.9 mm     --------- Stroke volume, 2D             55  ml    --------- Stroke volume/bsa, 2D           25  ml/m^2  --------- LV e&', lateral              7.71 cm/s   --------- LV E/e&', lateral             17.25     --------- LV e&', medial               8.74 cm/s   --------- LV E/e&', medial              15.22     --------- LV e&', average              8.23 cm/s   --------- LV E/e&', average             16.17     ---------  Ventricular septum            Value     Reference IVS thickness, ED             11.5 mm    ---------  LVOT                   Value     Reference LVOT ID, S                22  mm    --------- LVOT area                 3.8  cm^2   --------- LVOT peak velocity, S           61.1 cm/s   --------- LVOT mean velocity, S           40.7 cm/s   --------- LVOT  VTI, S                14.6 cm    ---------  Aortic valve               Value     Reference Aortic valve mean velocity, S       198  cm/s   --------- Aortic valve VTI, S            56.1 cm    --------- Aortic mean gradient, S          19  mm Hg  --------- Aortic peak gradient, S          36  mm Hg  --------- VTI ratio, LVOT/AV            0.26      --------- Aortic valve area/bsa, VTI        0.45 cm^2/m^2 --------- Aortic valve area/bsa, peak        0.35 cm^2/m^2 --------- velocity Velocity ratio, mean, LVOT/AV       0.21      --------- Aortic valve area/bsa, mean        0.36 cm^2/m^2 --------- velocity  Aorta                   Value     Reference Aortic root ID, ED             26  mm    ---------  Left atrium                Value     Reference LA ID, A-P, ES              59  mm    --------- LA ID/bsa, A-P          (H)   2.7  cm/m^2  <=2.2 LA volume, S               292  ml    --------- LA volume/bsa, S             133.6 ml/m^2  --------- LA volume, ES, 1-p A4C          219  ml    --------- LA volume/bsa, ES, 1-p A4C        100.2 ml/m^2  --------- LA volume, ES, 1-p A2C          368  ml    --------- LA volume/bsa, ES, 1-p A2C        168.3 ml/m^2  ---------  Mitral valve               Value     Reference Mitral E-wave peak velocity        133  cm/s   --------- Mitral A-wave peak velocity        30.7 cm/s   --------- Mitral deceleration time         204  ms    150 - 230 Mitral peak gradient, D          7   mm Hg  --------- Mitral E/A ratio, peak          4.3      ---------  Pulmonary arteries            Value     Reference PA pressure, S, DP        (H)   73  mm Hg  <=30  Tricuspid valve  Value     Reference Tricuspid regurg peak velocity      382  cm/s   --------- Tricuspid peak RV-RA gradient       58  mm Hg  ---------  Right ventricle              Value     Reference RV s&', lateral, S             12  cm/s   ---------  Legend: (L) and (H) mark values outside specified reference range.  ------------------------------------------------------------------- Prepared and Electronically Authenticated by  Loralie Champagne, M.D. 2016-01-11T15:41:03    Transesophageal Echocardiography  Patient:  Lance Cook, Lance Cook MR #:    [id number] Study Date: 10/11/2014 Gender:   M Age:    39 Height:    154.9 cm Weight:   110 kg BSA:    2.24 m^2 Pt. Status: Room:  SONOGRAPHER Florentina Jenny, RDCS ADMITTING  Lyman Bishop MD ATTENDING  Lyman Bishop MD ORDERING   Lyman Bishop MD PERFORMING  Lyman Bishop MD REFERRING  Lyman Bishop MD  cc:  ------------------------------------------------------------------- LV EF: 60% -  65%  ------------------------------------------------------------------- Indications:   Mitral regurgitation 424.0.  ------------------------------------------------------------------- History:  PMH:  Aortic valve disease.  ------------------------------------------------------------------- Study Conclusions  - Left ventricle: There was mild concentric hypertrophy. Systolic function was normal. The estimated ejection fraction was in the range of 60% to 65%. Wall motion was normal; there were no regional wall motion abnormalities. - Aortic valve: Mechanical bileaflet valve - no obstruction. Trace to mild physiologic AI. - Mitral valve: There is a flail leaflet, likely to the P2 scallop of the posterior leaflet. This prolapses through the valve plane. There is moderate to severe posteriorly directed regurgitation from a central jet and a small eccentric jet is also noted. - Left atrium: Massively dilated - smoke is present. No evidence of thrombus in the atrial cavity or appendage. - Pulmonary veins: No anomaly- systolic flow reversal noted in the RUPV. - Right atrium: No evidence of thrombus in the atrial cavity or appendage. - Atrial septum: Bows from left to right - no PFO by color doppler. - Pulmonic valve: No evidence of vegetation.  Impressions:  - Stable mechanical AVR without obstruction. Flail P2 cord with moderate to severe posteriorly directed MR, massive LAE, no LAA thrombus, significant LA smoke, normal LV function.  Diagnostic transesophageal echocardiography. 2D and color  Doppler. Birthdate: Patient birthdate: [birth year]. Age: Patient is 54 yr old. Sex: Gender: male.  BMI: 45.8 kg/m^2. Blood pressure: 127/51 Patient status: Outpatient. Study date: Study date: 10/11/2014. Study time: 11:05 AM. Location: Endoscopy.  -------------------------------------------------------------------  ------------------------------------------------------------------- Left ventricle: There was mild concentric hypertrophy. Systolic function was normal. The estimated ejection fraction was in the range of 60% to 65%. Wall motion was normal; there were no regional wall motion abnormalities.  ------------------------------------------------------------------- Aortic valve: Mechanical bileaflet valve - no obstruction. Trace to mild physiologic AI.  ------------------------------------------------------------------- Aorta: The aorta was normal, not dilated, and non-diseased.  ------------------------------------------------------------------- Mitral valve: There is a flail leaflet, likely to the P2 scallop of the posterior leaflet. This prolapses through the valve plane. There is moderate to severe posteriorly directed regurgitation from a central jet and a small eccentric jet is also noted.  ------------------------------------------------------------------- Left atrium: Massively dilated - smoke is present. No evidence of thrombus in the atrial cavity or appendage.  ------------------------------------------------------------------- Atrial septum: Bows from left to right - no PFO by color doppler.  ------------------------------------------------------------------- Pulmonary veins: No anomaly- systolic flow reversal noted in the  RUPV.  ------------------------------------------------------------------- Right ventricle: Poorly visualized.  ------------------------------------------------------------------- Pulmonic valve:  Structurally  normal valve.  Cusp separation was normal. No evidence of vegetation.  ------------------------------------------------------------------- Tricuspid valve:  Doppler: There was mild regurgitation.  ------------------------------------------------------------------- Pulmonary artery:  The main pulmonary artery was normal-sized.  ------------------------------------------------------------------- Right atrium: The atrium was normal in size. No evidence of thrombus in the atrial cavity or appendage.  ------------------------------------------------------------------- Pericardium: There was no pericardial effusion.  ------------------------------------------------------------------- Post procedure conclusions Ascending Aorta:  - The aorta was normal, not dilated, and non-diseased.  ------------------------------------------------------------------- Prepared and Electronically Authenticated by  Lyman Bishop MD 2016-02-10T18:19:57   CT ANGIOGRAPHY CHEST, ABDOMEN AND PELVIS  TECHNIQUE: Multidetector CT imaging through the chest, abdomen and pelvis was performed using the standard protocol during bolus administration of intravenous contrast. Multiplanar reconstructed images and MIPs were obtained and reviewed to evaluate the vascular anatomy.  CONTRAST: 195mL OMNIPAQUE IOHEXOL 350 MG/ML SOLN  COMPARISON: 10/19/2009  FINDINGS: CTA CHEST  The non construct contrast scout shows changes of median sternotomy and AVR with ascending aortic repair. Moderate coronary calcifications. No hyperdense crescent or mediastinal hematoma.  CTA via right arm contrast injection. The SVC is patent. Mild right atrial enlargement. RV/LV ratio less than 1, normal. Dilated central pulmonary arteries. Satisfactory opacification of pulmonary arteries noted, and there is no evidence of pulmonary emboli. Patent superior and inferior pulmonary veins bilaterally. Marked left  atrial enlargement. Probable thrombus in the left atrial appendage, which in retrospect was probably present on the previous exam. 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 Probable graft from this segment extends towards the right coronary artery and appears patent. The aorta at this level measures 4.2 cm maximum transverse diameter, previous 4.1 cm at the same level. More distal thoracic aorta is nondilated. Classic 3 vessel brachiocephalic arterial origin anatomy without proximal stenosis. Scattered calcified plaque in the arch and descending segment. No aortic stenosis.  Small right pleural effusion. No pericardial effusion. Subcentimeter prevascular, AP window, right paratracheal, and precarinal lymph nodes. No hilar adenopathy. A calcified granuloma in the medial basal segment right lower lobe. Lungs otherwise clear. Bridging osteophytes across multiple contiguous levels in the mid and lower thoracic spine.  Review of the MIP images confirms the above findings.  CTA ABDOMEN AND PELVIS  Arterial findings:  Aorta: Scattered calcified plaque. No aneurysm, dissection, or stenosis.  Celiac axis: Patent.  Superior mesenteric: Patent, with classic distal branch anatomy.  Left renal: Single, patent  Right renal: Single, patent  Inferior mesenteric: Patent  Left iliac: Scattered calcified plaque through the common iliac without stenosis. Internal and external iliac arteries patent.  Right iliac: Scattered calcified plaque through the common iliac. Internal and external iliacs patent.  Venous findings: Dedicated venous phase imaging not obtained.  Review of the MIP images confirms the above findings.  Nonvascular findings: Unremarkable 2 arterial phase evaluation  of liver. Surgical clips in the gallbladder fossa. Unremarkable spleen, adrenal glands. The left kidney is ptotic. 17 mm low-attenuation mid left renal lesion probably cysts but incompletely characterized. No hydronephrosis. Stomach, small bowel, and colon are nondilated. Normal appendix. Scattered diverticula in the ascending, distal descending and sigmoid portions without adjacent inflammatory/ edematous change. There is a small amount of pelvic ascites. Urinary bladder physiologically distended. No free air. Subcentimeter left para-aortic lymph nodes. No mesenteric or pelvic adenopathy. Facet DJD in the lumbar spine most marked L4-5. Bilateral  hip osteoarthritis.  IMPRESSION: 1. 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 2. Small right pleural effusion. 3. Trace pelvic ascites. 4. Scattered colonic diverticula.   Electronically Signed  By: Lucrezia Europe M.D.  On: 11/21/2014 13:32     Cardiac Catheterization Operative Report  Vinay Ertl [id number] 4/18/20167:46 AM SPENCER,SARA C, PA-C  Procedure Performed:  1. Selective Coronary Angiography 2. Right Heart Catheterization 3. Saphenous vein graft angiography 4. Aortic root angiogram  Operator: Lauree Chandler, MD  Indication: 54 yo male with prior mechanical aortic valve replacement, single vessel bypass to the RCA, thoracic aorta replacement (Bentall), DM, HTN, HLD who has been found to have severe mitral regurgitation. He has been seen in the CT surgery office by Dr. Roxy Manns and consideration is being given to mitral valve surgery. Plans in place for right heart cath with coronary angiography today.   Procedure Details: The risks, benefits, complications, treatment options, and expected outcomes were discussed with the patient. The patient and/or family concurred with the proposed plan, giving informed consent. The patient  was brought to the cath lab after IV hydration was begun and oral premedication was given. The patient was further sedated with Versed and Fentanyl. There was an IV catheter present in the right antecubital vein. This area was prepped and draped. I then advanced a wire through the IV catheter and exchanged out for a 5 [place] sheath. A balloon tipped catheter was used to perform a right heart catheterization. The right wrist was was prepped and draped in the usual manner. Using the modified Seldinger access technique, a 5/6 French sheath was placed in the right radial artery. 3 mg Verapamil was given through the sheath. 5000 units IV heparin was given. Standard diagnostic catheters were used to perform selective coronary angiography. I could not engage the vein graft. A pigtail catheter was used to perform an aortic root angiogram. I did not cross the mechanical aortic valve. There were no immediate complications. The patient was taken to the recovery area in stable condition.   Hemodynamic Findings: Ao: 116/63  LV: Not measured RA: 16 RV: 76/2/16 PA: 74/30 (mean 47)  PCWP: 24-28 Fick Cardiac Output: 4.38 L/min Fick Cardiac Index: 2.12 L/min/m2 Central Aortic Saturation: 93% Pulmonary Artery Saturation: 60%  Angiographic Findings:  Left main: The LAD and Circumflex arise from different ostia  Left Anterior Descending Artery: Large caliber vessel that courses to the apex. There appears to be a moderate caliber intermediate branch that arises from the proximal LAD. There is a moderate caliber diagonal branch. The distal LAD becomes small in caliber. There is no obstructive disease.   Circumflex Artery: Large dominant vessel with large obtuse marginal branch. No obstructive disease. I could not selectively engage this vessel despite attempts with multiple catheters but it is found to be patent with non-selective angiography.   Right Coronary Artery: Moderate caliber  non-dominant vessel with no obstructive disease. The bypass graft anastomoses in the proximal segment of the native RCA and fills retrograde with injection of the native vessel.   Graft Anatomy:  SVG to RCA: I could not engage this graft. I can see the graft filling retrograde from the native vessel and there is no competitive flow to suggest the graft is patent with antegrade flow. The graft feills retrograde and there is staining noted in what appears to be a dissection plane in the aortic root.   Aortic root angiography: Mild dilatation.  No aortic insufficiency.The grafted segment of the aorta is noted and there appears to be a dissection plane in the ascending aorta (see also CTA report).   Left Ventricular Angiogram: Deferred.   Impression: 1. Patent native coronary arteries with no obstructive disease. Difficult catheter engagement given prior aortic root replacement.  2. Probably occluded vein graft to the RCA (it is seen to fill retrograde from the native RCA and there is no competitive flow) 3. Elevated pressures as above 4. Abnormal findings in aortic root as noted above.   Recommendations: Will continue workup for mitral valve surgery per Dr. Roxy Manns and Dr. [name]. Resume coumadin tonight and resume Lovenox bridging tonight.    Complications: None; patient tolerated the procedure well.      Impression:  Patient has stage D severe symptomatic primary might regurgitation. The patient has mitral valve prolapse with a single ruptured primary chordae tendineae attached to the middle scallop of the posterior leaflet. 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 There is severe left atrial enlargement with smoke in the left atrium. 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 Left ventricular systolic function is preserved. The patient does not have  significant coronary artery disease. 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 Most of this dissection is actually calcified and probably extraluminal to the aortic graft. Risks associated with surgical intervention will be high because of the patient's previous cardiac surgery, chronic area of dissection, morbid obesity with obstructive sleep apnea, and severe pulmonary hypertension. Long-term prognosis without surgical intervention would be poor. Less invasive options such as a percutaneously placed mitral clip would likely be palliative at best. Under the circumstances I favor proceeding with high-risk mitral valve repair through a right mini thoracotomy approach. The patient might benefit from concomitant maze procedure, although the long term success of a maze procedure would be diminished by the chronicity of the patient's atrial fibrillation and severe left atrial enlargement.    Plan:  The patient and his wife were counseled at length regarding the indications, risks and potential benefits of mitral valve repair. The rationale for elective surgery has been explained, including a comparison between surgery and continued medical therapy with close follow-up. The high-risk nature of surgery under the patient's circumstances was discussed at length. The likelihood of successful and durable valve repair has been discussed with particular reference to the findings of their recent echocardiogram. Based upon these findings and previous experience, I have quoted them a greater than 90 percent likelihood of successful valve repair. In the unlikely event that their valve cannot be successfully repaired, we discussed the possibility of replacing the mitral valve using a mechanical prosthesis with the attendant need for long-term  anticoagulation versus the alternative of replacing it using a bioprosthetic tissue valve with its potential for late structural valve deterioration and failure, depending upon the patient's longevity. The patient specifically requests that if the mitral valve must be replaced that it be done using a mechanical valve. Alternative surgical approaches have been discussed including a comparison between conventional sternotomy and minimally-invasive techniques. The relative risks and benefits of each have been reviewed as they pertain to the patient's specific circumstances, and all of their questions have been addressed. The relative risks and benefits of performing a maze procedure  at the time of their surgery was discussed at length, including the expected likelihood of long term freedom from recurrent symptomatic atrial fibrillation and/or atrial flutter. The patient does not wish for [place] to attempt a maze procedure because of the small but potentially significant chance that a maze procedure might increase the need for permanent pacemaker placement. Specific risks potentially related to the minimally-invasive approach were discussed at length, including but not limited to risk of conversion to full or partial sternotomy, aortic dissection or other major vascular complication, unilateral acute lung injury or pulmonary edema, phrenic nerve dysfunction or paralysis, rib fracture, chronic pain, lung hernia, or lymphocele. The patient understands and accepts all potential risks of surgery including but not limited to risk of death, stroke or other neurologic complication, myocardial infarction, congestive heart failure, respiratory failure, renal failure, bleeding requiring transfusion and/or reexploration, arrhythmia, infection or other wound complications, pneumonia, pleural and/or pericardial effusion, pulmonary embolus, aortic dissection or other major vascular complication, or delayed complications related to  valve repair or replacement including but not limited to structural valve deterioration and failure, thrombosis, embolization, endocarditis, or paravalvular leak. Expectations for the patient's postoperative convalescence have been discussed in detail. We plan to proceed with surgery on Wednesday, 01/24/2015. The patient has been instructed to stop taking Coumadin after he takes his dose tomorrow. He has been given a prescription for Lovenox injections to be utilized as a bridge between then and the time of surgery. All of his questions have been addressed.    Valentina Gu. Roxy Manns, MD 01/16/2015 5:27 PM

## 2015-01-22 NOTE — Progress Notes (Signed)
.  I called a prescription for Mupirocin ointment to [facility] at [place], [place], [place]

## 2015-01-22 NOTE — Progress Notes (Signed)
Pre-op Cardiac Surgery  Carotid Findings:  Bilateral:  1-39% ICA stenosis.  Vertebral artery flow is antegrade.      Upper Extremity Right Left  Brachial Pressures 148 136  Radial Waveforms Tri Tri  Ulnar Waveforms Tri Tri  Palmar Arch (Allen's Test) Obliterates with radial compression, normal with ulnar compression Normal    Landry Mellow, RDMS, RVT 01/22/2015

## 2015-01-22 NOTE — Pre-Procedure Instructions (Signed)
    Lance Cook  01/22/2015        Your procedure is scheduled on Wednesday, May 25.  Report to [facility] Admitting at 6:30.M.   Call this number if you have problems the morning of surgery:  [phone]               For any other questions, please call [phone], Monday - Friday 8 AM - 4 PM.   Remember:  Do not eat food or drink liquids after midnight Tuesday, May 24.  Take these medicines the morning of surgery with A SIP OF WATER : atenolol (TENORMIN), busPIRone (BUSPAR), CARTIA XT , digoxin (LANOXIN), methocarbamol (ROBAXIN).                Take if needed:oxyCODONE (OXY IR/ROXICODONE).                 Stop taking Aspirin, Coumadin,   Plavix, Effient and Herbal medications.  Do not take any NSAIDs ie: Ibuprofen,  Advil,Naproxen or any medication containing Aspirin.                  Do not wear jewelry, make-up or nail polish.  Do not wear lotions, powders, or perfumes.   Men may shave face and neck.  Do not bring valuables to the hospital.  [facility] is not responsible for any belongings or valuables.  Contacts, dentures or bridgework may not be worn into surgery.  Leave your suitcase in the car.  After surgery it may be brought to your room.  For patients admitted to the hospital, discharge time will be determined by your treatment tea  Please read over the following fact sheets that you were given: Pain Management, Coughing and Deep Breathing, Blood Transfusion, Incentive Spirometry, [place]- nPreparing For Surgery, How To Manage Your Diabetes Before and After Surgery and How To Manage Diabetes Before and Surgery.

## 2015-01-23 ENCOUNTER — Encounter: Payer: Self-pay | Admitting: Thoracic Surgery (Cardiothoracic Vascular Surgery)

## 2015-01-23 ENCOUNTER — Telehealth: Payer: Self-pay | Admitting: Thoracic Surgery (Cardiothoracic Vascular Surgery)

## 2015-01-23 LAB — HEMOGLOBIN A1C
Hgb A1c MFr Bld: 5.9 % — ABNORMAL HIGH (ref 4.8–5.6)
Mean Plasma Glucose: 123 mg/dL

## 2015-01-23 MED ORDER — METOPROLOL TARTRATE 12.5 MG HALF TABLET: 12.5000 mg | ORAL_TABLET | Freq: Once | ORAL | Status: DC

## 2015-01-23 MED ORDER — SODIUM CHLORIDE 0.9 % IV SOLN
INTRAVENOUS | Status: AC
  Administered 2015-01-24: 14 mL/h via INTRAVENOUS
  Administered 2015-01-24: 69.8 mL/h via INTRAVENOUS
  Filled 2015-01-23: qty 40

## 2015-01-23 MED ORDER — PHENYLEPHRINE HCL 10 MG/ML IJ SOLN
30.0000 ug/min | INTRAVENOUS | Status: AC
  Administered 2015-01-24: 50 ug/min via INTRAVENOUS
  Filled 2015-01-23: qty 2

## 2015-01-23 MED ORDER — DEXTROSE 5 % IV SOLN
1.5000 g | INTRAVENOUS | Status: AC
  Administered 2015-01-24: 1.5 g via INTRAVENOUS
  Administered 2015-01-24: .75 g via INTRAVENOUS
  Filled 2015-01-23: qty 1.5

## 2015-01-23 MED ORDER — PLASMA-LYTE 148 IV SOLN
INTRAVENOUS | Status: DC
  Filled 2015-01-23: qty 2.5

## 2015-01-23 MED ORDER — SODIUM CHLORIDE 0.9 % IV SOLN
INTRAVENOUS | Status: DC
  Filled 2015-01-23: qty 30

## 2015-01-23 MED ORDER — NITROGLYCERIN IN D5W 200-5 MCG/ML-% IV SOLN
2.0000 ug/min | INTRAVENOUS | Status: DC
  Filled 2015-01-23: qty 250

## 2015-01-23 MED ORDER — SODIUM CHLORIDE 0.9 % IV SOLN
INTRAVENOUS | Status: AC
  Administered 2015-01-24: 1 [IU]/h via INTRAVENOUS
  Filled 2015-01-23: qty 2.5

## 2015-01-23 MED ORDER — GLUTARALDEHYDE 0.625% SOAKING SOLUTION
TOPICAL | Status: DC | PRN
  Filled 2015-01-23: qty 50

## 2015-01-23 MED ORDER — DEXTROSE 5 % IV SOLN
750.0000 mg | INTRAVENOUS | Status: DC
  Filled 2015-01-23: qty 750

## 2015-01-23 MED ORDER — VANCOMYCIN HCL 1000 MG IV SOLR
INTRAVENOUS | Status: AC
  Administered 2015-01-24: 1000 mL
  Filled 2015-01-23: qty 1000

## 2015-01-23 MED ORDER — EPINEPHRINE HCL 1 MG/ML IJ SOLN
0.0000 ug/min | INTRAVENOUS | Status: DC
  Filled 2015-01-23: qty 4

## 2015-01-23 MED ORDER — POTASSIUM CHLORIDE 2 MEQ/ML IV SOLN
80.0000 meq | INTRAVENOUS | Status: DC
  Filled 2015-01-23: qty 40

## 2015-01-23 MED ORDER — VANCOMYCIN HCL 10 G IV SOLR
1500.0000 mg | INTRAVENOUS | Status: AC
  Administered 2015-01-24: 1500 mg via INTRAVENOUS
  Filled 2015-01-23: qty 1500

## 2015-01-23 MED ORDER — DEXMEDETOMIDINE HCL IN NACL 400 MCG/100ML IV SOLN
0.1000 ug/kg/h | INTRAVENOUS | Status: AC
  Administered 2015-01-24: .3 ug/kg/h via INTRAVENOUS
  Filled 2015-01-23: qty 100

## 2015-01-23 MED ORDER — MAGNESIUM SULFATE 50 % IJ SOLN
40.0000 meq | INTRAMUSCULAR | Status: DC
  Filled 2015-01-23: qty 10

## 2015-01-23 MED ORDER — DOPAMINE-DEXTROSE 3.2-5 MG/ML-% IV SOLN
0.0000 ug/kg/min | INTRAVENOUS | Status: DC
  Filled 2015-01-23: qty 250

## 2015-01-23 NOTE — Telephone Encounter (Signed)
Called to speak with patient regarding surgery planned for tomorrow.  Discussed results of MRSA nasal swab and associated implications.  Discussed arrangements and procedures regarding the patient's hospitalization, specifically with reference to the fact that the patient's wife would not be allowed to stay in his room continuously while he is in the ICU, but that liberal visitation would be encouraged.  All questions answered.  Rexene Alberts, MD 01/23/2015 8:50 AM

## 2015-01-24 ENCOUNTER — Encounter (HOSPITAL_COMMUNITY): Payer: Self-pay | Admitting: *Deleted

## 2015-01-24 ENCOUNTER — Inpatient Hospital Stay (HOSPITAL_COMMUNITY): Payer: Medicare HMO

## 2015-01-24 ENCOUNTER — Inpatient Hospital Stay (HOSPITAL_COMMUNITY): Payer: Medicare HMO | Admitting: Anesthesiology

## 2015-01-24 ENCOUNTER — Encounter (HOSPITAL_COMMUNITY)
Admission: RE | Disposition: A | Discharge status: Rehabilitation Facility | Payer: Medicare HMO | Source: Ambulatory Visit | Attending: Thoracic Surgery (Cardiothoracic Vascular Surgery)

## 2015-01-24 ENCOUNTER — Inpatient Hospital Stay (HOSPITAL_COMMUNITY)
Admission: RE | Admit: 2015-01-24 | Discharge: 2015-02-08 | DRG: 219 | Disposition: A | Discharge status: Rehabilitation Facility | Payer: Medicare HMO | Source: Ambulatory Visit | Attending: Thoracic Surgery (Cardiothoracic Vascular Surgery) | Admitting: Thoracic Surgery (Cardiothoracic Vascular Surgery)

## 2015-01-24 DIAGNOSIS — J95811 Postprocedural pneumothorax: Secondary | ICD-10-CM | POA: Diagnosis not present

## 2015-01-24 DIAGNOSIS — D509 Iron deficiency anemia, unspecified: Secondary | ICD-10-CM | POA: Diagnosis present

## 2015-01-24 DIAGNOSIS — J9690 Respiratory failure, unspecified, unspecified whether with hypoxia or hypercapnia: Secondary | ICD-10-CM

## 2015-01-24 DIAGNOSIS — J189 Pneumonia, unspecified organism: Secondary | ICD-10-CM | POA: Diagnosis not present

## 2015-01-24 DIAGNOSIS — I5032 Chronic diastolic (congestive) heart failure: Secondary | ICD-10-CM

## 2015-01-24 DIAGNOSIS — R570 Cardiogenic shock: Secondary | ICD-10-CM | POA: Diagnosis not present

## 2015-01-24 DIAGNOSIS — J9589 Other postprocedural complications and disorders of respiratory system, not elsewhere classified: Secondary | ICD-10-CM | POA: Diagnosis not present

## 2015-01-24 DIAGNOSIS — I872 Venous insufficiency (chronic) (peripheral): Secondary | ICD-10-CM | POA: Diagnosis present

## 2015-01-24 DIAGNOSIS — I10 Essential (primary) hypertension: Secondary | ICD-10-CM | POA: Diagnosis not present

## 2015-01-24 DIAGNOSIS — E876 Hypokalemia: Secondary | ICD-10-CM | POA: Diagnosis not present

## 2015-01-24 DIAGNOSIS — I480 Paroxysmal atrial fibrillation: Secondary | ICD-10-CM

## 2015-01-24 DIAGNOSIS — Z880 Allergy status to penicillin: Secondary | ICD-10-CM | POA: Diagnosis not present

## 2015-01-24 DIAGNOSIS — I509 Heart failure, unspecified: Secondary | ICD-10-CM

## 2015-01-24 DIAGNOSIS — Z452 Encounter for adjustment and management of vascular access device: Secondary | ICD-10-CM

## 2015-01-24 DIAGNOSIS — I4891 Unspecified atrial fibrillation: Secondary | ICD-10-CM

## 2015-01-24 DIAGNOSIS — R0602 Shortness of breath: Secondary | ICD-10-CM

## 2015-01-24 DIAGNOSIS — D62 Acute posthemorrhagic anemia: Secondary | ICD-10-CM | POA: Diagnosis not present

## 2015-01-24 DIAGNOSIS — Z6841 Body Mass Index (BMI) 40.0 and over, adult: Secondary | ICD-10-CM | POA: Diagnosis not present

## 2015-01-24 DIAGNOSIS — G9340 Encephalopathy, unspecified: Secondary | ICD-10-CM | POA: Diagnosis not present

## 2015-01-24 DIAGNOSIS — L97909 Non-pressure chronic ulcer of unspecified part of unspecified lower leg with unspecified severity: Secondary | ICD-10-CM | POA: Diagnosis present

## 2015-01-24 DIAGNOSIS — G4700 Insomnia, unspecified: Secondary | ICD-10-CM | POA: Diagnosis not present

## 2015-01-24 DIAGNOSIS — J9811 Atelectasis: Secondary | ICD-10-CM

## 2015-01-24 DIAGNOSIS — Z8249 Family history of ischemic heart disease and other diseases of the circulatory system: Secondary | ICD-10-CM

## 2015-01-24 DIAGNOSIS — Y838 Other surgical procedures as the cause of abnormal reaction of the patient, or of later complication, without mention of misadventure at the time of the procedure: Secondary | ICD-10-CM | POA: Diagnosis not present

## 2015-01-24 DIAGNOSIS — N179 Acute kidney failure, unspecified: Secondary | ICD-10-CM | POA: Diagnosis not present

## 2015-01-24 DIAGNOSIS — F419 Anxiety disorder, unspecified: Secondary | ICD-10-CM | POA: Diagnosis present

## 2015-01-24 DIAGNOSIS — R609 Edema, unspecified: Secondary | ICD-10-CM | POA: Diagnosis not present

## 2015-01-24 DIAGNOSIS — F17210 Nicotine dependence, cigarettes, uncomplicated: Secondary | ICD-10-CM | POA: Diagnosis present

## 2015-01-24 DIAGNOSIS — A047 Enterocolitis due to Clostridium difficile: Secondary | ICD-10-CM | POA: Diagnosis not present

## 2015-01-24 DIAGNOSIS — Z9289 Personal history of other medical treatment: Secondary | ICD-10-CM

## 2015-01-24 DIAGNOSIS — G4733 Obstructive sleep apnea (adult) (pediatric): Secondary | ICD-10-CM | POA: Diagnosis present

## 2015-01-24 DIAGNOSIS — Z79891 Long term (current) use of opiate analgesic: Secondary | ICD-10-CM

## 2015-01-24 DIAGNOSIS — T17990A Other foreign object in respiratory tract, part unspecified in causing asphyxiation, initial encounter: Secondary | ICD-10-CM | POA: Diagnosis not present

## 2015-01-24 DIAGNOSIS — Z7901 Long term (current) use of anticoagulants: Secondary | ICD-10-CM | POA: Diagnosis not present

## 2015-01-24 DIAGNOSIS — I272 Other secondary pulmonary hypertension: Secondary | ICD-10-CM | POA: Diagnosis present

## 2015-01-24 DIAGNOSIS — Z823 Family history of stroke: Secondary | ICD-10-CM

## 2015-01-24 DIAGNOSIS — Y95 Nosocomial condition: Secondary | ICD-10-CM | POA: Diagnosis not present

## 2015-01-24 DIAGNOSIS — I87311 Chronic venous hypertension (idiopathic) with ulcer of right lower extremity: Secondary | ICD-10-CM

## 2015-01-24 DIAGNOSIS — E11649 Type 2 diabetes mellitus with hypoglycemia without coma: Secondary | ICD-10-CM | POA: Diagnosis not present

## 2015-01-24 DIAGNOSIS — Z8673 Personal history of transient ischemic attack (TIA), and cerebral infarction without residual deficits: Secondary | ICD-10-CM

## 2015-01-24 DIAGNOSIS — R1310 Dysphagia, unspecified: Secondary | ICD-10-CM | POA: Diagnosis not present

## 2015-01-24 DIAGNOSIS — Z952 Presence of prosthetic heart valve: Secondary | ICD-10-CM | POA: Diagnosis not present

## 2015-01-24 DIAGNOSIS — M109 Gout, unspecified: Secondary | ICD-10-CM | POA: Diagnosis present

## 2015-01-24 DIAGNOSIS — Z954 Presence of other heart-valve replacement: Secondary | ICD-10-CM | POA: Diagnosis not present

## 2015-01-24 DIAGNOSIS — I252 Old myocardial infarction: Secondary | ICD-10-CM

## 2015-01-24 DIAGNOSIS — I87319 Chronic venous hypertension (idiopathic) with ulcer of unspecified lower extremity: Secondary | ICD-10-CM | POA: Diagnosis present

## 2015-01-24 DIAGNOSIS — J9601 Acute respiratory failure with hypoxia: Secondary | ICD-10-CM | POA: Diagnosis not present

## 2015-01-24 DIAGNOSIS — M545 Low back pain: Secondary | ICD-10-CM | POA: Diagnosis present

## 2015-01-24 DIAGNOSIS — I481 Persistent atrial fibrillation: Secondary | ICD-10-CM | POA: Diagnosis present

## 2015-01-24 DIAGNOSIS — G8929 Other chronic pain: Secondary | ICD-10-CM | POA: Diagnosis present

## 2015-01-24 DIAGNOSIS — D6959 Other secondary thrombocytopenia: Secondary | ICD-10-CM | POA: Diagnosis not present

## 2015-01-24 DIAGNOSIS — Z833 Family history of diabetes mellitus: Secondary | ICD-10-CM | POA: Diagnosis not present

## 2015-01-24 DIAGNOSIS — I2510 Atherosclerotic heart disease of native coronary artery without angina pectoris: Secondary | ICD-10-CM | POA: Diagnosis present

## 2015-01-24 DIAGNOSIS — I5033 Acute on chronic diastolic (congestive) heart failure: Secondary | ICD-10-CM | POA: Diagnosis not present

## 2015-01-24 DIAGNOSIS — E870 Hyperosmolality and hypernatremia: Secondary | ICD-10-CM | POA: Diagnosis not present

## 2015-01-24 DIAGNOSIS — I739 Peripheral vascular disease, unspecified: Secondary | ICD-10-CM | POA: Diagnosis present

## 2015-01-24 DIAGNOSIS — Z91048 Other nonmedicinal substance allergy status: Secondary | ICD-10-CM | POA: Diagnosis not present

## 2015-01-24 DIAGNOSIS — I340 Nonrheumatic mitral (valve) insufficiency: Principal | ICD-10-CM

## 2015-01-24 DIAGNOSIS — J9600 Acute respiratory failure, unspecified whether with hypoxia or hypercapnia: Secondary | ICD-10-CM

## 2015-01-24 DIAGNOSIS — J80 Acute respiratory distress syndrome: Secondary | ICD-10-CM | POA: Diagnosis not present

## 2015-01-24 DIAGNOSIS — G9341 Metabolic encephalopathy: Secondary | ICD-10-CM | POA: Diagnosis not present

## 2015-01-24 DIAGNOSIS — J9610 Chronic respiratory failure, unspecified whether with hypoxia or hypercapnia: Secondary | ICD-10-CM | POA: Diagnosis present

## 2015-01-24 DIAGNOSIS — Z8547 Personal history of malignant neoplasm of testis: Secondary | ICD-10-CM

## 2015-01-24 DIAGNOSIS — Z79899 Other long term (current) drug therapy: Secondary | ICD-10-CM | POA: Diagnosis not present

## 2015-01-24 DIAGNOSIS — E785 Hyperlipidemia, unspecified: Secondary | ICD-10-CM | POA: Diagnosis present

## 2015-01-24 DIAGNOSIS — T502X5A Adverse effect of carbonic-anhydrase inhibitors, benzothiadiazides and other diuretics, initial encounter: Secondary | ICD-10-CM | POA: Diagnosis not present

## 2015-01-24 DIAGNOSIS — Z951 Presence of aortocoronary bypass graft: Secondary | ICD-10-CM | POA: Diagnosis not present

## 2015-01-24 DIAGNOSIS — R5381 Other malaise: Secondary | ICD-10-CM | POA: Diagnosis not present

## 2015-01-24 DIAGNOSIS — R600 Localized edema: Secondary | ICD-10-CM | POA: Diagnosis not present

## 2015-01-24 DIAGNOSIS — I890 Lymphedema, not elsewhere classified: Secondary | ICD-10-CM | POA: Diagnosis not present

## 2015-01-24 DIAGNOSIS — A0472 Enterocolitis due to Clostridium difficile, not specified as recurrent: Secondary | ICD-10-CM | POA: Diagnosis not present

## 2015-01-24 HISTORY — DX: Presence of other heart-valve replacement: Z95.4

## 2015-01-24 HISTORY — PX: MITRAL VALVE REPLACEMENT: SHX147

## 2015-01-24 HISTORY — PX: TEE WITHOUT CARDIOVERSION: SHX5443

## 2015-01-24 LAB — PLATELET COUNT: Platelets: 90 10*3/uL — ABNORMAL LOW (ref 150–400)

## 2015-01-24 LAB — POCT I-STAT 3, ART BLOOD GAS (G3+)
ACID-BASE DEFICIT: 4 mmol/L — AB (ref 0.0–2.0)
ACID-BASE DEFICIT: 3 mmol/L — AB (ref 0.0–2.0)
ACID-BASE DEFICIT: 3 mmol/L — AB (ref 0.0–2.0)
Acid-base deficit: 2 mmol/L (ref 0.0–2.0)
Acid-base deficit: 5 mmol/L — ABNORMAL HIGH (ref 0.0–2.0)
BICARBONATE: 25.8 meq/L — AB (ref 20.0–24.0)
BICARBONATE: 25.6 meq/L — AB (ref 20.0–24.0)
BICARBONATE: 23.5 meq/L (ref 20.0–24.0)
Bicarbonate: 24.4 mEq/L — ABNORMAL HIGH (ref 20.0–24.0)
Bicarbonate: 24.4 mEq/L — ABNORMAL HIGH (ref 20.0–24.0)
Bicarbonate: 26.9 mEq/L — ABNORMAL HIGH (ref 20.0–24.0)
O2 SAT: 85 %
O2 Saturation: 100 %
O2 Saturation: 91 %
O2 Saturation: 73 %
O2 Saturation: 85 %
O2 Saturation: 95 %
PCO2 ART: 57.3 mmHg — AB (ref 35.0–45.0)
PH ART: 7.252 — AB (ref 7.350–7.450)
PO2 ART: 393 mmHg — AB (ref 80.0–100.0)
PO2 ART: 76 mmHg — AB (ref 80.0–100.0)
PO2 ART: 56 mmHg — AB (ref 80.0–100.0)
PO2 ART: 56 mmHg — AB (ref 80.0–100.0)
TCO2: 27 mmol/L (ref 0–100)
TCO2: 26 mmol/L (ref 0–100)
TCO2: 26 mmol/L (ref 0–100)
TCO2: 29 mmol/L (ref 0–100)
TCO2: 27 mmol/L (ref 0–100)
TCO2: 25 mmol/L (ref 0–100)
pCO2 arterial: 45.4 mmHg — ABNORMAL HIGH (ref 35.0–45.0)
pCO2 arterial: 59.3 mmHg (ref 35.0–45.0)
pCO2 arterial: 42.5 mmHg (ref 35.0–45.0)
pCO2 arterial: 62.9 mmHg (ref 35.0–45.0)
pCO2 arterial: 52.5 mmHg — ABNORMAL HIGH (ref 35.0–45.0)
pH, Arterial: 7.363 (ref 7.350–7.450)
pH, Arterial: 7.223 — ABNORMAL LOW (ref 7.350–7.450)
pH, Arterial: 7.361 (ref 7.350–7.450)
pH, Arterial: 7.232 — ABNORMAL LOW (ref 7.350–7.450)
pH, Arterial: 7.255 — ABNORMAL LOW (ref 7.350–7.450)
pO2, Arterial: 37 mmHg — CL (ref 80.0–100.0)
pO2, Arterial: 85 mmHg (ref 80.0–100.0)

## 2015-01-24 LAB — POCT I-STAT, CHEM 8
BUN: 17 mg/dL (ref 6–20)
BUN: 15 mg/dL (ref 6–20)
BUN: 16 mg/dL (ref 6–20)
BUN: 16 mg/dL (ref 6–20)
BUN: 16 mg/dL (ref 6–20)
BUN: 16 mg/dL (ref 6–20)
BUN: 15 mg/dL (ref 6–20)
BUN: 16 mg/dL (ref 6–20)
BUN: 15 mg/dL (ref 6–20)
CALCIUM ION: 1.18 mmol/L (ref 1.12–1.23)
CALCIUM ION: 1.14 mmol/L (ref 1.12–1.23)
CALCIUM ION: 1.13 mmol/L (ref 1.12–1.23)
CHLORIDE: 101 mmol/L (ref 101–111)
CHLORIDE: 101 mmol/L (ref 101–111)
CHLORIDE: 103 mmol/L (ref 101–111)
CREATININE: 0.6 mg/dL — AB (ref 0.61–1.24)
CREATININE: 0.6 mg/dL — AB (ref 0.61–1.24)
Calcium, Ion: 1.1 mmol/L — ABNORMAL LOW (ref 1.12–1.23)
Calcium, Ion: 1.22 mmol/L (ref 1.12–1.23)
Calcium, Ion: 1.12 mmol/L (ref 1.12–1.23)
Calcium, Ion: 1.12 mmol/L (ref 1.12–1.23)
Calcium, Ion: 1.08 mmol/L — ABNORMAL LOW (ref 1.12–1.23)
Calcium, Ion: 1.06 mmol/L — ABNORMAL LOW (ref 1.12–1.23)
Chloride: 100 mmol/L — ABNORMAL LOW (ref 101–111)
Chloride: 100 mmol/L — ABNORMAL LOW (ref 101–111)
Chloride: 98 mmol/L — ABNORMAL LOW (ref 101–111)
Chloride: 100 mmol/L — ABNORMAL LOW (ref 101–111)
Chloride: 103 mmol/L (ref 101–111)
Chloride: 102 mmol/L (ref 101–111)
Creatinine, Ser: 0.6 mg/dL — ABNORMAL LOW (ref 0.61–1.24)
Creatinine, Ser: 0.7 mg/dL (ref 0.61–1.24)
Creatinine, Ser: 0.6 mg/dL — ABNORMAL LOW (ref 0.61–1.24)
Creatinine, Ser: 0.6 mg/dL — ABNORMAL LOW (ref 0.61–1.24)
Creatinine, Ser: 0.6 mg/dL — ABNORMAL LOW (ref 0.61–1.24)
Creatinine, Ser: 0.6 mg/dL — ABNORMAL LOW (ref 0.61–1.24)
Creatinine, Ser: 0.7 mg/dL (ref 0.61–1.24)
GLUCOSE: 109 mg/dL — AB (ref 65–99)
GLUCOSE: 130 mg/dL — AB (ref 65–99)
GLUCOSE: 150 mg/dL — AB (ref 65–99)
GLUCOSE: 134 mg/dL — AB (ref 65–99)
Glucose, Bld: 127 mg/dL — ABNORMAL HIGH (ref 65–99)
Glucose, Bld: 170 mg/dL — ABNORMAL HIGH (ref 65–99)
Glucose, Bld: 169 mg/dL — ABNORMAL HIGH (ref 65–99)
Glucose, Bld: 122 mg/dL — ABNORMAL HIGH (ref 65–99)
Glucose, Bld: 154 mg/dL — ABNORMAL HIGH (ref 65–99)
HCT: 28 % — ABNORMAL LOW (ref 39.0–52.0)
HCT: 25 % — ABNORMAL LOW (ref 39.0–52.0)
HCT: 26 % — ABNORMAL LOW (ref 39.0–52.0)
HCT: 30 % — ABNORMAL LOW (ref 39.0–52.0)
HCT: 35 % — ABNORMAL LOW (ref 39.0–52.0)
HEMATOCRIT: 39 % (ref 39.0–52.0)
HEMATOCRIT: 33 % — AB (ref 39.0–52.0)
HEMATOCRIT: 39 % (ref 39.0–52.0)
HEMATOCRIT: 30 % — AB (ref 39.0–52.0)
HEMOGLOBIN: 13.3 g/dL (ref 13.0–17.0)
HEMOGLOBIN: 9.5 g/dL — AB (ref 13.0–17.0)
HEMOGLOBIN: 8.5 g/dL — AB (ref 13.0–17.0)
HEMOGLOBIN: 8.8 g/dL — AB (ref 13.0–17.0)
HEMOGLOBIN: 10.2 g/dL — AB (ref 13.0–17.0)
Hemoglobin: 13.3 g/dL (ref 13.0–17.0)
Hemoglobin: 11.2 g/dL — ABNORMAL LOW (ref 13.0–17.0)
Hemoglobin: 10.2 g/dL — ABNORMAL LOW (ref 13.0–17.0)
Hemoglobin: 11.9 g/dL — ABNORMAL LOW (ref 13.0–17.0)
POTASSIUM: 3.7 mmol/L (ref 3.5–5.1)
POTASSIUM: 4.8 mmol/L (ref 3.5–5.1)
Potassium: 3.8 mmol/L (ref 3.5–5.1)
Potassium: 3.9 mmol/L (ref 3.5–5.1)
Potassium: 3.6 mmol/L (ref 3.5–5.1)
Potassium: 4.2 mmol/L (ref 3.5–5.1)
Potassium: 3.6 mmol/L (ref 3.5–5.1)
Potassium: 3.5 mmol/L (ref 3.5–5.1)
Potassium: 3.8 mmol/L (ref 3.5–5.1)
SODIUM: 140 mmol/L (ref 135–145)
SODIUM: 138 mmol/L (ref 135–145)
SODIUM: 139 mmol/L (ref 135–145)
SODIUM: 139 mmol/L (ref 135–145)
SODIUM: 139 mmol/L (ref 135–145)
SODIUM: 140 mmol/L (ref 135–145)
Sodium: 140 mmol/L (ref 135–145)
Sodium: 136 mmol/L (ref 135–145)
Sodium: 139 mmol/L (ref 135–145)
TCO2: 26 mmol/L (ref 0–100)
TCO2: 24 mmol/L (ref 0–100)
TCO2: 25 mmol/L (ref 0–100)
TCO2: 27 mmol/L (ref 0–100)
TCO2: 26 mmol/L (ref 0–100)
TCO2: 26 mmol/L (ref 0–100)
TCO2: 26 mmol/L (ref 0–100)
TCO2: 23 mmol/L (ref 0–100)
TCO2: 21 mmol/L (ref 0–100)

## 2015-01-24 LAB — CBC
HEMATOCRIT: 41.3 % (ref 39.0–52.0)
Hemoglobin: 13.2 g/dL (ref 13.0–17.0)
MCH: 26.5 pg (ref 26.0–34.0)
MCHC: 32 g/dL (ref 30.0–36.0)
MCV: 82.8 fL (ref 78.0–100.0)
Platelets: 181 10*3/uL (ref 150–400)
RBC: 4.99 MIL/uL (ref 4.22–5.81)
RDW: 16.4 % — ABNORMAL HIGH (ref 11.5–15.5)
WBC: 24.7 10*3/uL — ABNORMAL HIGH (ref 4.0–10.5)

## 2015-01-24 LAB — GLUCOSE, CAPILLARY
GLUCOSE-CAPILLARY: 101 mg/dL — AB (ref 65–99)
GLUCOSE-CAPILLARY: 108 mg/dL — AB (ref 65–99)
Glucose-Capillary: 86 mg/dL (ref 65–99)
Glucose-Capillary: 81 mg/dL (ref 65–99)
Glucose-Capillary: 98 mg/dL (ref 65–99)

## 2015-01-24 LAB — PROTIME-INR
INR: 1.68 — ABNORMAL HIGH (ref 0.00–1.49)
Prothrombin Time: 19.8 seconds — ABNORMAL HIGH (ref 11.6–15.2)

## 2015-01-24 LAB — POCT I-STAT 4, (NA,K, GLUC, HGB,HCT)
GLUCOSE: 100 mg/dL — AB (ref 65–99)
HEMATOCRIT: 40 % (ref 39.0–52.0)
Hemoglobin: 13.6 g/dL (ref 13.0–17.0)
Potassium: 3.5 mmol/L (ref 3.5–5.1)
Sodium: 139 mmol/L (ref 135–145)

## 2015-01-24 LAB — HEMOGLOBIN AND HEMATOCRIT, BLOOD
HCT: 31.1 % — ABNORMAL LOW (ref 39.0–52.0)
HEMOGLOBIN: 9.9 g/dL — AB (ref 13.0–17.0)

## 2015-01-24 LAB — PREPARE RBC (CROSSMATCH)

## 2015-01-24 LAB — APTT: aPTT: 41 seconds — ABNORMAL HIGH (ref 24–37)

## 2015-01-24 SURGERY — ECHOCARDIOGRAM, TRANSESOPHAGEAL
Anesthesia: General | Site: Chest | Laterality: Right

## 2015-01-24 MED ORDER — EPHEDRINE SULFATE 50 MG/ML IJ SOLN
INTRAMUSCULAR | Status: AC
  Filled 2015-01-24: qty 1

## 2015-01-24 MED ORDER — EPHEDRINE SULFATE 50 MG/ML IJ SOLN
INTRAMUSCULAR | Status: DC | PRN
  Administered 2015-01-24: 10 mg via INTRAVENOUS

## 2015-01-24 MED ORDER — FENTANYL CITRATE (PF) 100 MCG/2ML IJ SOLN: 100.0000 ug | Freq: Once | INTRAMUSCULAR | Status: DC | PRN

## 2015-01-24 MED ORDER — ROCURONIUM BROMIDE 50 MG/5ML IV SOLN
INTRAVENOUS | Status: AC
  Filled 2015-01-24: qty 1

## 2015-01-24 MED ORDER — PANTOPRAZOLE SODIUM 40 MG PO TBEC: 40.0000 mg | DELAYED_RELEASE_TABLET | Freq: Every day | ORAL | Status: DC

## 2015-01-24 MED ORDER — MIDAZOLAM HCL 2 MG/2ML IJ SOLN: 2.0000 mg | Freq: Once | INTRAMUSCULAR | Status: DC | PRN

## 2015-01-24 MED ORDER — SODIUM CHLORIDE 0.9 % IV SOLN
INTRAVENOUS | Status: DC
  Administered 2015-01-24 – 2015-01-28 (×2): via INTRAVENOUS

## 2015-01-24 MED ORDER — ASPIRIN EC 325 MG PO TBEC
325.0000 mg | DELAYED_RELEASE_TABLET | Freq: Every day | ORAL | Status: DC
  Administered 2015-01-25 – 2015-02-05 (×4): 325 mg via ORAL
  Filled 2015-01-24 (×13): qty 1

## 2015-01-24 MED ORDER — MILRINONE IN DEXTROSE 20 MG/100ML IV SOLN
0.1000 ug/kg/min | INTRAVENOUS | Status: DC
  Administered 2015-01-24 – 2015-01-26 (×4): 0.3 ug/kg/min via INTRAVENOUS
  Administered 2015-01-26 – 2015-01-30 (×6): 0.2 ug/kg/min via INTRAVENOUS
  Filled 2015-01-24 (×11): qty 100

## 2015-01-24 MED ORDER — DEXTROSE 5 % IV SOLN
0.0000 ug/min | INTRAVENOUS | Status: DC
  Administered 2015-01-24: 120 ug/min via INTRAVENOUS
  Filled 2015-01-24 (×2): qty 2

## 2015-01-24 MED ORDER — FENTANYL CITRATE (PF) 250 MCG/5ML IJ SOLN
INTRAMUSCULAR | Status: AC
  Filled 2015-01-24: qty 5

## 2015-01-24 MED ORDER — ARTIFICIAL TEARS OP OINT
1.0000 "application " | TOPICAL_OINTMENT | Freq: Three times a day (TID) | OPHTHALMIC | Status: DC
  Administered 2015-01-24 – 2015-01-25 (×2): 1 via OPHTHALMIC
  Filled 2015-01-24 (×2): qty 3.5

## 2015-01-24 MED ORDER — SODIUM CHLORIDE 0.9 % IV SOLN
0.5000 g/h | Freq: Once | INTRAVENOUS | Status: DC
  Filled 2015-01-24: qty 20

## 2015-01-24 MED ORDER — NOREPINEPHRINE BITARTRATE 1 MG/ML IV SOLN
0.0000 ug/min | INTRAVENOUS | Status: DC
  Administered 2015-01-25: 2 ug/min via INTRAVENOUS
  Administered 2015-01-25: 12 ug/min via INTRAVENOUS
  Administered 2015-01-25 – 2015-01-26 (×5): 15 ug/min via INTRAVENOUS
  Filled 2015-01-24 (×7): qty 4

## 2015-01-24 MED ORDER — VANCOMYCIN HCL IN DEXTROSE 1-5 GM/200ML-% IV SOLN
1000.0000 mg | Freq: Once | INTRAVENOUS | Status: AC
  Administered 2015-01-25: 1000 mg via INTRAVENOUS
  Filled 2015-01-24: qty 200

## 2015-01-24 MED ORDER — FENTANYL CITRATE (PF) 100 MCG/2ML IJ SOLN
INTRAMUSCULAR | Status: DC | PRN
  Administered 2015-01-24: 100 ug via INTRAVENOUS
  Administered 2015-01-24: 150 ug via INTRAVENOUS
  Administered 2015-01-24: 200 ug via INTRAVENOUS
  Administered 2015-01-24: 100 ug via INTRAVENOUS
  Administered 2015-01-24: 50 ug via INTRAVENOUS

## 2015-01-24 MED ORDER — PHENYLEPHRINE HCL 10 MG/ML IJ SOLN
0.0000 ug/min | INTRAMUSCULAR | Status: DC
  Administered 2015-01-25 (×2): 150 ug/min via INTRAVENOUS
  Administered 2015-01-25: 130 ug/min via INTRAVENOUS
  Administered 2015-01-25: 140 ug/min via INTRAVENOUS
  Administered 2015-01-25: 150 ug/min via INTRAVENOUS
  Administered 2015-01-26: 80 ug/min via INTRAVENOUS
  Administered 2015-01-26 (×3): 150 ug/min via INTRAVENOUS
  Administered 2015-01-27: 70 ug/min via INTRAVENOUS
  Filled 2015-01-24 (×11): qty 4

## 2015-01-24 MED ORDER — POTASSIUM CHLORIDE 10 MEQ/50ML IV SOLN
10.0000 meq | INTRAVENOUS | Status: AC
  Administered 2015-01-24 (×3): 10 meq via INTRAVENOUS

## 2015-01-24 MED ORDER — MIDAZOLAM BOLUS VIA INFUSION
2.0000 mg | INTRAVENOUS | Status: DC | PRN
  Filled 2015-01-24: qty 2

## 2015-01-24 MED ORDER — MIDAZOLAM HCL 5 MG/ML IJ SOLN
1.0000 mg/h | INTRAMUSCULAR | Status: DC
  Administered 2015-01-24: 1 mg/h via INTRAVENOUS
  Administered 2015-01-25 – 2015-01-26 (×2): 2 mg/h via INTRAVENOUS
  Filled 2015-01-24 (×4): qty 10

## 2015-01-24 MED ORDER — PROPOFOL 10 MG/ML IV BOLUS
INTRAVENOUS | Status: DC | PRN
  Administered 2015-01-24: 100 mg via INTRAVENOUS

## 2015-01-24 MED ORDER — MILRINONE IN DEXTROSE 20 MG/100ML IV SOLN
0.1250 ug/kg/min | INTRAVENOUS | Status: AC
  Administered 2015-01-24: .3 ug/kg/min via INTRAVENOUS
  Filled 2015-01-24: qty 100

## 2015-01-24 MED ORDER — CHLORHEXIDINE GLUCONATE 4 % EX LIQD: 30.0000 mL | CUTANEOUS | Status: DC

## 2015-01-24 MED ORDER — FENTANYL BOLUS VIA INFUSION
50.0000 ug | INTRAVENOUS | Status: DC | PRN
  Filled 2015-01-24: qty 50

## 2015-01-24 MED ORDER — ACETAMINOPHEN 500 MG PO TABS
1000.0000 mg | ORAL_TABLET | Freq: Four times a day (QID) | ORAL | Status: AC
  Administered 2015-01-25 (×2): 1000 mg via ORAL
  Filled 2015-01-24 (×18): qty 2

## 2015-01-24 MED ORDER — SODIUM CHLORIDE 0.9 % IJ SOLN: 3.0000 mL | INTRAMUSCULAR | Status: DC | PRN

## 2015-01-24 MED ORDER — MIDAZOLAM HCL 5 MG/5ML IJ SOLN
INTRAMUSCULAR | Status: DC | PRN
  Administered 2015-01-24: 1 mg via INTRAVENOUS
  Administered 2015-01-24 (×2): 2 mg via INTRAVENOUS
  Administered 2015-01-24: 3 mg via INTRAVENOUS
  Administered 2015-01-24: 2 mg via INTRAVENOUS

## 2015-01-24 MED ORDER — LEVALBUTEROL HCL 0.63 MG/3ML IN NEBU: 0.6300 mg | INHALATION_SOLUTION | RESPIRATORY_TRACT | Status: DC | PRN

## 2015-01-24 MED ORDER — DOCUSATE SODIUM 100 MG PO CAPS: 200.0000 mg | ORAL_CAPSULE | Freq: Every day | ORAL | Status: DC

## 2015-01-24 MED ORDER — MAGNESIUM SULFATE 4 GM/100ML IV SOLN
4.0000 g | Freq: Once | INTRAVENOUS | Status: AC
  Administered 2015-01-24: 4 g via INTRAVENOUS
  Filled 2015-01-24: qty 100

## 2015-01-24 MED ORDER — MIDAZOLAM HCL 10 MG/2ML IJ SOLN
INTRAMUSCULAR | Status: AC
  Filled 2015-01-24: qty 2

## 2015-01-24 MED ORDER — CHLORHEXIDINE GLUCONATE 0.12 % MT SOLN
15.0000 mL | Freq: Two times a day (BID) | OROMUCOSAL | Status: DC
  Administered 2015-01-24 – 2015-02-03 (×20): 15 mL via OROMUCOSAL
  Filled 2015-01-24 (×20): qty 15

## 2015-01-24 MED ORDER — MIDAZOLAM HCL 2 MG/2ML IJ SOLN
2.0000 mg | INTRAMUSCULAR | Status: DC | PRN
  Administered 2015-01-24: 2 mg via INTRAVENOUS
  Filled 2015-01-24: qty 2

## 2015-01-24 MED ORDER — 0.9 % SODIUM CHLORIDE (POUR BTL) OPTIME
TOPICAL | Status: DC | PRN
  Administered 2015-01-24: 3000 mL
  Administered 2015-01-24: 6000 mL

## 2015-01-24 MED ORDER — OXYCODONE HCL 5 MG PO TABS: 5.0000 mg | ORAL_TABLET | ORAL | Status: DC | PRN

## 2015-01-24 MED ORDER — MIDAZOLAM HCL 2 MG/2ML IJ SOLN
INTRAMUSCULAR | Status: AC
  Administered 2015-01-24: 2 mg via INTRAVENOUS
  Filled 2015-01-24: qty 2

## 2015-01-24 MED ORDER — MIDAZOLAM HCL 5 MG/5ML IJ SOLN: INTRAMUSCULAR | Status: DC | PRN

## 2015-01-24 MED ORDER — LACTATED RINGERS IV SOLN: 500.0000 mL | Freq: Once | INTRAVENOUS | Status: AC | PRN

## 2015-01-24 MED ORDER — LACTATED RINGERS IV SOLN: INTRAVENOUS | Status: DC

## 2015-01-24 MED ORDER — HEPARIN SODIUM (PORCINE) 1000 UNIT/ML IJ SOLN
INTRAMUSCULAR | Status: DC | PRN
Start: 2015-01-24 — End: 2015-01-24
  Administered 2015-01-24: 28000 [IU] via INTRAVENOUS

## 2015-01-24 MED ORDER — PROTAMINE SULFATE 10 MG/ML IV SOLN
INTRAVENOUS | Status: AC
  Filled 2015-01-24: qty 25

## 2015-01-24 MED ORDER — ARTIFICIAL TEARS OP OINT
TOPICAL_OINTMENT | OPHTHALMIC | Status: AC
  Filled 2015-01-24: qty 3.5

## 2015-01-24 MED ORDER — DEXMEDETOMIDINE HCL IN NACL 200 MCG/50ML IV SOLN: 0.0000 ug/kg/h | INTRAVENOUS | Status: DC

## 2015-01-24 MED ORDER — TRAMADOL HCL 50 MG PO TABS: 50.0000 mg | ORAL_TABLET | ORAL | Status: DC | PRN

## 2015-01-24 MED ORDER — CISATRACURIUM BESYLATE (PF) 200 MG/20ML IV SOLN
3.0000 ug/kg/min | INTRAVENOUS | Status: DC
  Administered 2015-01-24: 0.5 ug/kg/min via INTRAVENOUS
  Administered 2015-01-25: 1.5 ug/kg/min via INTRAVENOUS
  Filled 2015-01-24 (×2): qty 20

## 2015-01-24 MED ORDER — PROPOFOL 10 MG/ML IV BOLUS
INTRAVENOUS | Status: AC
  Filled 2015-01-24: qty 20

## 2015-01-24 MED ORDER — ACETAMINOPHEN 650 MG RE SUPP
650.0000 mg | Freq: Once | RECTAL | Status: AC
  Administered 2015-01-24: 650 mg via RECTAL

## 2015-01-24 MED ORDER — SODIUM BICARBONATE 8.4 % IV SOLN
INTRAVENOUS | Status: DC | PRN
  Administered 2015-01-24: 25 mL via INTRAVENOUS

## 2015-01-24 MED ORDER — LACTATED RINGERS IV SOLN
INTRAVENOUS | Status: DC | PRN
  Administered 2015-01-24 (×3): via INTRAVENOUS

## 2015-01-24 MED ORDER — ALBUMIN HUMAN 5 % IV SOLN
INTRAVENOUS | Status: DC | PRN
  Administered 2015-01-24: 16:00:00 via INTRAVENOUS

## 2015-01-24 MED ORDER — BISACODYL 10 MG RE SUPP: 10.0000 mg | Freq: Every day | RECTAL | Status: DC

## 2015-01-24 MED ORDER — NITROGLYCERIN IN D5W 200-5 MCG/ML-% IV SOLN: 0.0000 ug/min | INTRAVENOUS | Status: DC

## 2015-01-24 MED ORDER — SODIUM CHLORIDE 0.9 % IJ SOLN
INTRAMUSCULAR | Status: AC
  Filled 2015-01-24: qty 10

## 2015-01-24 MED ORDER — ROCURONIUM BROMIDE 50 MG/5ML IV SOLN
INTRAVENOUS | Status: AC
  Filled 2015-01-24: qty 2

## 2015-01-24 MED ORDER — FENTANYL CITRATE (PF) 100 MCG/2ML IJ SOLN
100.0000 ug | Freq: Once | INTRAMUSCULAR | Status: AC
  Administered 2015-01-24: 100 ug via INTRAVENOUS

## 2015-01-24 MED ORDER — SODIUM CHLORIDE 0.9 % IV SOLN
INTRAVENOUS | Status: AC
  Administered 2015-01-24: 21:00:00 via INTRAVENOUS

## 2015-01-24 MED ORDER — SODIUM CHLORIDE 0.9 % IV SOLN
250.0000 mL | INTRAVENOUS | Status: DC
  Administered 2015-01-25: 20 mL/h via INTRAVENOUS

## 2015-01-24 MED ORDER — SUCCINYLCHOLINE CHLORIDE 20 MG/ML IJ SOLN
INTRAMUSCULAR | Status: AC
  Filled 2015-01-24: qty 1

## 2015-01-24 MED ORDER — FAMOTIDINE IN NACL 20-0.9 MG/50ML-% IV SOLN
20.0000 mg | Freq: Two times a day (BID) | INTRAVENOUS | Status: AC
  Administered 2015-01-24: 20 mg via INTRAVENOUS

## 2015-01-24 MED ORDER — MORPHINE SULFATE 2 MG/ML IJ SOLN: 2.0000 mg | INTRAMUSCULAR | Status: DC | PRN

## 2015-01-24 MED ORDER — SODIUM CHLORIDE 0.45 % IV SOLN
INTRAVENOUS | Status: DC | PRN
  Administered 2015-01-24: 21:00:00 via INTRAVENOUS

## 2015-01-24 MED ORDER — DEXTROSE 5 % IV SOLN
1.5000 g | Freq: Two times a day (BID) | INTRAVENOUS | Status: AC
  Administered 2015-01-24 – 2015-01-26 (×4): 1.5 g via INTRAVENOUS
  Filled 2015-01-24 (×4): qty 1.5

## 2015-01-24 MED ORDER — FENTANYL CITRATE (PF) 100 MCG/2ML IJ SOLN: 100.0000 ug | Freq: Once | INTRAMUSCULAR | Status: DC

## 2015-01-24 MED ORDER — HEMOSTATIC AGENTS (NO CHARGE) OPTIME
TOPICAL | Status: DC | PRN
  Administered 2015-01-24: 1 via TOPICAL

## 2015-01-24 MED ORDER — SODIUM CHLORIDE 0.9 % IJ SOLN
3.0000 mL | Freq: Two times a day (BID) | INTRAMUSCULAR | Status: DC
  Administered 2015-01-25 – 2015-01-30 (×9): 3 mL via INTRAVENOUS

## 2015-01-24 MED ORDER — BISACODYL 5 MG PO TBEC: 10.0000 mg | DELAYED_RELEASE_TABLET | Freq: Every day | ORAL | Status: DC

## 2015-01-24 MED ORDER — ONDANSETRON HCL 4 MG/2ML IJ SOLN: 4.0000 mg | Freq: Four times a day (QID) | INTRAMUSCULAR | Status: DC | PRN

## 2015-01-24 MED ORDER — ACETAMINOPHEN 160 MG/5ML PO SOLN: 650.0000 mg | Freq: Once | ORAL | Status: AC

## 2015-01-24 MED ORDER — INSULIN REGULAR BOLUS VIA INFUSION
0.0000 [IU] | Freq: Three times a day (TID) | INTRAVENOUS | Status: DC
  Filled 2015-01-24: qty 10

## 2015-01-24 MED ORDER — MORPHINE SULFATE 2 MG/ML IJ SOLN
1.0000 mg | INTRAMUSCULAR | Status: DC | PRN
  Filled 2015-01-24: qty 2

## 2015-01-24 MED ORDER — FENTANYL CITRATE (PF) 100 MCG/2ML IJ SOLN
INTRAMUSCULAR | Status: AC
  Administered 2015-01-24: 100 ug via INTRAVENOUS
  Filled 2015-01-24: qty 2

## 2015-01-24 MED ORDER — DEXMEDETOMIDINE HCL IN NACL 200 MCG/50ML IV SOLN
INTRAVENOUS | Status: AC
  Filled 2015-01-24: qty 50

## 2015-01-24 MED ORDER — METOPROLOL TARTRATE 1 MG/ML IV SOLN
2.5000 mg | INTRAVENOUS | Status: DC | PRN
Start: 2015-01-24 — End: 2015-02-08
  Filled 2015-01-24: qty 5

## 2015-01-24 MED ORDER — ASPIRIN 81 MG PO CHEW
324.0000 mg | CHEWABLE_TABLET | Freq: Every day | ORAL | Status: DC
  Administered 2015-01-26 – 2015-02-01 (×7): 324 mg
  Filled 2015-01-24 (×7): qty 4

## 2015-01-24 MED ORDER — CISATRACURIUM BOLUS VIA INFUSION
0.0500 mg/kg | Freq: Once | INTRAVENOUS | Status: DC
  Filled 2015-01-24: qty 6

## 2015-01-24 MED ORDER — SODIUM CHLORIDE 0.9 % IV SOLN
INTRAVENOUS | Status: DC
  Administered 2015-01-24: 0.3 [IU]/h via INTRAVENOUS
  Filled 2015-01-24: qty 2.5

## 2015-01-24 MED ORDER — VECURONIUM BROMIDE 10 MG IV SOLR
10.0000 mg | Freq: Once | INTRAVENOUS | Status: AC
Start: 2015-01-24 — End: 2015-01-24
  Administered 2015-01-24: 10 mg via INTRAVENOUS

## 2015-01-24 MED ORDER — ACETAMINOPHEN 160 MG/5ML PO SOLN
1000.0000 mg | Freq: Four times a day (QID) | ORAL | Status: AC
  Administered 2015-01-24 – 2015-01-29 (×16): 1000 mg
  Filled 2015-01-24 (×15): qty 40.6

## 2015-01-24 MED ORDER — PROTAMINE SULFATE 10 MG/ML IV SOLN
INTRAVENOUS | Status: DC | PRN
  Administered 2015-01-24: 25 mg via INTRAVENOUS
  Administered 2015-01-24: 50 mg via INTRAVENOUS
  Administered 2015-01-24: 25 mg via INTRAVENOUS
  Administered 2015-01-24 (×3): 50 mg via INTRAVENOUS

## 2015-01-24 MED ORDER — CETYLPYRIDINIUM CHLORIDE 0.05 % MT LIQD
7.0000 mL | Freq: Four times a day (QID) | OROMUCOSAL | Status: DC
  Administered 2015-01-25 – 2015-02-03 (×40): 7 mL via OROMUCOSAL

## 2015-01-24 MED ORDER — HEPARIN SODIUM (PORCINE) 1000 UNIT/ML IJ SOLN
INTRAMUSCULAR | Status: AC
  Filled 2015-01-24: qty 1

## 2015-01-24 MED ORDER — MIDAZOLAM HCL 2 MG/2ML IJ SOLN: 2.0000 mg | Freq: Once | INTRAMUSCULAR | Status: DC

## 2015-01-24 MED ORDER — ALBUMIN HUMAN 5 % IV SOLN
250.0000 mL | INTRAVENOUS | Status: AC | PRN
  Administered 2015-01-24 – 2015-01-25 (×4): 250 mL via INTRAVENOUS
  Filled 2015-01-24 (×2): qty 250

## 2015-01-24 MED ORDER — ROCURONIUM BROMIDE 100 MG/10ML IV SOLN
INTRAVENOUS | Status: DC | PRN
  Administered 2015-01-24: 30 mg via INTRAVENOUS
  Administered 2015-01-24: 50 mg via INTRAVENOUS
  Administered 2015-01-24: 100 mg via INTRAVENOUS
  Administered 2015-01-24 (×3): 50 mg via INTRAVENOUS
  Administered 2015-01-24: 20 mg via INTRAVENOUS

## 2015-01-24 MED ORDER — SODIUM CHLORIDE 0.9 % IJ SOLN
OROMUCOSAL | Status: DC | PRN
  Administered 2015-01-24 (×2): via TOPICAL

## 2015-01-24 MED ORDER — ARTIFICIAL TEARS OP OINT
TOPICAL_OINTMENT | OPHTHALMIC | Status: DC | PRN
  Administered 2015-01-24: 1 via OPHTHALMIC

## 2015-01-24 MED ORDER — SODIUM CHLORIDE 0.9 % IV SOLN
25.0000 ug/h | INTRAVENOUS | Status: DC
  Administered 2015-01-24: 50 ug/h via INTRAVENOUS
  Administered 2015-01-25 – 2015-01-26 (×2): 150 ug/h via INTRAVENOUS
  Administered 2015-01-27: 50 ug/h via INTRAVENOUS
  Filled 2015-01-24 (×4): qty 50

## 2015-01-24 SURGICAL SUPPLY — 111 items
ADAPTER CARDIO PERF ANTE/RETRO (ADAPTER) ×3 IMPLANT
BAG DECANTER FOR FLEXI CONT (MISCELLANEOUS) ×6 IMPLANT
BLADE CORE FAN STRYKER (BLADE) ×3 IMPLANT
BLADE STERNUM SYSTEM 6 (BLADE) ×3 IMPLANT
BLADE SURG 11 STRL SS (BLADE) ×6 IMPLANT
BOOT SUTURE AID YELLOW STND (SUTURE) ×3 IMPLANT
CANISTER SUCTION 2500CC (MISCELLANEOUS) ×6 IMPLANT
CANNULA FEM VENOUS REMOTE 22FR (CANNULA) ×3 IMPLANT
CANNULA FEMORAL ART 14 SM (MISCELLANEOUS) ×3 IMPLANT
CANNULA GUNDRY RCSP 15FR (MISCELLANEOUS) ×3 IMPLANT
CANNULA OPTISITE PERFUSION 16F (CANNULA) IMPLANT
CANNULA OPTISITE PERFUSION 18F (CANNULA) ×3 IMPLANT
CANNULA SUMP PERICARDIAL (CANNULA) ×6 IMPLANT
CATH KIT ON Q 5IN SLV (PAIN MANAGEMENT) IMPLANT
CATH THORACIC 36FR (CATHETERS) ×3 IMPLANT
CELLS DAT CNTRL 66122 CELL SVR (MISCELLANEOUS) ×2 IMPLANT
CONN ST 1/4X3/8  BEN (MISCELLANEOUS) ×2
CONN ST 1/4X3/8 BEN (MISCELLANEOUS) ×4 IMPLANT
CONNECTOR 1/2X3/8X1/2 3 WAY (MISCELLANEOUS) ×1
CONNECTOR 1/2X3/8X1/2 3WAY (MISCELLANEOUS) ×2 IMPLANT
CONT SPEC 4OZ CLIKSEAL STRL BL (MISCELLANEOUS) ×6 IMPLANT
CONT SPEC STER OR (MISCELLANEOUS) ×3 IMPLANT
COR-KNOT ELITE COMBO KIT (Prosthesis & Implant Heart) ×6 IMPLANT
COUNTER NEEDLE 20 DBL MAG RED (NEEDLE) ×3 IMPLANT
COVER BACK TABLE 24X17X13 BIG (DRAPES) ×3 IMPLANT
CRADLE DONUT ADULT HEAD (MISCELLANEOUS) ×3 IMPLANT
DERMABOND ADVANCED (GAUZE/BANDAGES/DRESSINGS) ×2
DERMABOND ADVANCED .7 DNX12 (GAUZE/BANDAGES/DRESSINGS) ×4 IMPLANT
DEVICE PMI PUNCTURE CLOSURE (MISCELLANEOUS) ×3 IMPLANT
DEVICE SUT CK QUICK LOAD INDV (Prosthesis & Implant Heart) ×15 IMPLANT
DEVICE SUT CK QUICK LOAD MINI (Prosthesis & Implant Heart) ×21 IMPLANT
DEVICE TROCAR PUNCTURE CLOSURE (ENDOMECHANICALS) ×3 IMPLANT
DRAIN CHANNEL 28F RND 3/8 FF (WOUND CARE) ×6 IMPLANT
DRAPE BILATERAL SPLIT (DRAPES) ×3 IMPLANT
DRAPE C-ARM 42X72 X-RAY (DRAPES) ×3 IMPLANT
DRAPE CV SPLIT W-CLR ANES SCRN (DRAPES) ×3 IMPLANT
DRAPE INCISE IOBAN 66X45 STRL (DRAPES) ×12 IMPLANT
DRAPE SLUSH/WARMER DISC (DRAPES) ×3 IMPLANT
DRSG COVADERM 4X8 (GAUZE/BANDAGES/DRESSINGS) ×3 IMPLANT
ELECT BLADE 6.5 EXT (BLADE) ×3 IMPLANT
ELECT REM PT RETURN 9FT ADLT (ELECTROSURGICAL) ×6
ELECTRODE REM PT RTRN 9FT ADLT (ELECTROSURGICAL) ×4 IMPLANT
FEMORAL VENOUS CANN RAP (CANNULA) IMPLANT
GLOVE BIO SURGEON STRL SZ 6 (GLOVE) ×9 IMPLANT
GLOVE BIO SURGEON STRL SZ 6.5 (GLOVE) ×9 IMPLANT
GLOVE BIO SURGEON STRL SZ7 (GLOVE) ×12 IMPLANT
GLOVE ORTHO TXT STRL SZ7.5 (GLOVE) ×9 IMPLANT
GOWN STRL REUS W/ TWL LRG LVL3 (GOWN DISPOSABLE) ×16 IMPLANT
GOWN STRL REUS W/TWL LRG LVL3 (GOWN DISPOSABLE) ×8
KIT BASIN OR (CUSTOM PROCEDURE TRAY) ×3 IMPLANT
KIT DILATOR VASC 18G NDL (KITS) ×3 IMPLANT
KIT DRAINAGE VACCUM ASSIST (KITS) ×3 IMPLANT
KIT ROOM TURNOVER OR (KITS) ×3 IMPLANT
KIT SUCTION CATH 14FR (SUCTIONS) ×6 IMPLANT
LEAD PACING MYOCARDI (MISCELLANEOUS) ×6 IMPLANT
LINE VENT (MISCELLANEOUS) ×6 IMPLANT
NEEDLE AORTIC ROOT 14G 7F (CATHETERS) ×3 IMPLANT
NS IRRIG 1000ML POUR BTL (IV SOLUTION) ×15 IMPLANT
PACK OPEN HEART (CUSTOM PROCEDURE TRAY) ×3 IMPLANT
PACK TRANSFER 300ML (TOM) (MISCELLANEOUS) ×24 IMPLANT
PAD ARMBOARD 7.5X6 YLW CONV (MISCELLANEOUS) ×6 IMPLANT
PAD ELECT DEFIB RADIOL ZOLL (MISCELLANEOUS) ×3 IMPLANT
RETRACTOR TRL SOFT TISSUE LG (INSTRUMENTS) IMPLANT
RETRACTOR TRM SOFT TISSUE 7.5 (INSTRUMENTS) IMPLANT
RING HOLDER ×3 IMPLANT
RING MITRAL MEMO 3D 34MM SMD34 (Prosthesis & Implant Heart) ×3 IMPLANT
RTRCTR WOUND ALEXIS 18CM MED (MISCELLANEOUS) ×3
SET CANNULATION TOURNIQUET (MISCELLANEOUS) ×3 IMPLANT
SET CARDIOPLEGIA MPS 5001102 (MISCELLANEOUS) ×3 IMPLANT
SET IRRIG TUBING LAPAROSCOPIC (IRRIGATION / IRRIGATOR) ×3 IMPLANT
SET Y EXTENSION LINE CSP (IV SETS) ×3 IMPLANT
SOLUTION ANTI FOG 6CC (MISCELLANEOUS) ×3 IMPLANT
SPONGE GAUZE 4X4 12PLY STER LF (GAUZE/BANDAGES/DRESSINGS) ×3 IMPLANT
SPONGE LAP 4X18 X RAY DECT (DISPOSABLE) ×3 IMPLANT
SUT BONE WAX W31G (SUTURE) ×3 IMPLANT
SUT E-PACK MINIMALLY INVASIVE (SUTURE) ×3 IMPLANT
SUT ETHIBOND (SUTURE) ×6 IMPLANT
SUT ETHIBOND 2 0 SH (SUTURE) ×12 IMPLANT
SUT ETHIBOND 2-0 RB-1 WHT (SUTURE) ×6 IMPLANT
SUT ETHIBOND X763 2 0 SH 1 (SUTURE) ×3 IMPLANT
SUT GORETEX CV 4 TH 22 36 (SUTURE) ×3 IMPLANT
SUT GORETEX CV4 TH-18 (SUTURE) ×6 IMPLANT
SUT PROLENE 3 0 SH DA (SUTURE) ×15 IMPLANT
SUT PROLENE 3 0 SH1 36 (SUTURE) ×12 IMPLANT
SUT PROLENE 4 0 RB 1 (SUTURE) ×8
SUT PROLENE 4-0 RB1 .5 CRCL 36 (SUTURE) ×16 IMPLANT
SUT SILK 2 0 SH CR/8 (SUTURE) IMPLANT
SUT SILK 3 0 SH CR/8 (SUTURE) IMPLANT
SUT VIC AB 2-0 CTX 36 (SUTURE) IMPLANT
SUT VIC AB 3-0 MH 27 (SUTURE) ×3 IMPLANT
SUT VIC AB 3-0 SH 8-18 (SUTURE) ×9 IMPLANT
SUT VICRYL 2 TP 1 (SUTURE) ×3 IMPLANT
SYRINGE 10CC LL (SYRINGE) ×3 IMPLANT
SYSTEM SAHARA CHEST DRAIN ATS (WOUND CARE) ×6 IMPLANT
TAPE CLOTH SURG 4X10 WHT LF (GAUZE/BANDAGES/DRESSINGS) ×3 IMPLANT
TAPE PAPER 2X10 WHT MICROPORE (GAUZE/BANDAGES/DRESSINGS) ×3 IMPLANT
TOWEL OR 17X24 6PK STRL BLUE (TOWEL DISPOSABLE) ×6 IMPLANT
TOWEL OR 17X26 10 PK STRL BLUE (TOWEL DISPOSABLE) ×6 IMPLANT
TRANSDUCER COBE DISPOSABLE (MISCELLANEOUS) ×3 IMPLANT
TRAY FOLEY IC TEMP SENS 16FR (CATHETERS) ×3 IMPLANT
TROCAR XCEL BLADELESS 5X75MML (TROCAR) ×3 IMPLANT
TROCAR XCEL NON-BLD 11X100MML (ENDOMECHANICALS) ×6 IMPLANT
TUBE SUCT INTRACARD DLP 20F (MISCELLANEOUS) ×3 IMPLANT
TUBING MEDICAL 3X8X3X32 (MISCELLANEOUS) ×3 IMPLANT
TUBING PVC 1/4X1/16 WALL 8 (MISCELLANEOUS) ×6 IMPLANT
TUNNELER SHEATH ON-Q 11GX8 DSP (PAIN MANAGEMENT) IMPLANT
UNDERPAD 30X30 INCONTINENT (UNDERPADS AND DIAPERS) ×3 IMPLANT
VALVE ST JUDE MITRAL (Prosthesis & Implant Heart) ×1 IMPLANT
VALVE ST JUDE MITRAL 33X26.1 (Prosthesis & Implant Heart) ×2 IMPLANT
WATER STERILE IRR 1000ML POUR (IV SOLUTION) ×6 IMPLANT
WIRE BENTSON .035X145CM (WIRE) ×3 IMPLANT

## 2015-01-24 NOTE — Progress Notes (Signed)
  Echocardiogram Echocardiogram Transesophageal has been performed.  Jolly Carlini FRANCES 01/24/2015, 10:19 AM

## 2015-01-24 NOTE — Anesthesia Preprocedure Evaluation (Addendum)
Anesthesia Evaluation  Patient identified by MRN, date of birth, ID band Patient awake    Reviewed: Allergy & Precautions, NPO status , Patient's Chart, lab work & pertinent test results  Airway Mallampati: III   Neck ROM: full    Dental  (+) Teeth Intact, Dental Advisory Given   Pulmonary shortness of breath, sleep apnea , Current Smoker,  breath sounds clear to auscultation        Cardiovascular hypertension, + CAD, + Past MI, + Peripheral Vascular Disease and +CHF + dysrhythmias Atrial Fibrillation + Valvular Problems/Murmurs Rhythm:Irregular Rate:Normal  S/p bentall.   Neuro/Psych TIA Neuromuscular disease CVA    GI/Hepatic hiatal hernia, GERD-  ,  Endo/Other  diabetes, Type 2Morbid obesity  Renal/GU      Musculoskeletal  (+) Arthritis -,   Abdominal   Peds  Hematology   Anesthesia Other Findings   Reproductive/Obstetrics                            Anesthesia Physical Anesthesia Plan  ASA: III  Anesthesia Plan: General   Post-op Pain Management:    Induction: Intravenous  Airway Management Planned: Oral ETT and Double Lumen EBT  Additional Equipment: Arterial line, CVP, PA Cath, 3D TEE and Ultrasound Guidance Line Placement  Intra-op Plan:   Post-operative Plan: Post-operative intubation/ventilation  Informed Consent: I have reviewed the patients History and Physical, chart, labs and discussed the procedure including the risks, benefits and alternatives for the proposed anesthesia with the patient or authorized representative who has indicated his/her understanding and acceptance.     Plan Discussed with: CRNA, Anesthesiologist and Surgeon  Anesthesia Plan Comments:         Anesthesia Quick Evaluation

## 2015-01-24 NOTE — Brief Op Note (Addendum)
01/24/2015      [address]       [place] [address]             [phone]     01/24/2015  4:03 PM  PATIENT:  Lance Cook  54 y.o. male  PRE-OPERATIVE DIAGNOSIS:  MR AFIB  POST-OPERATIVE DIAGNOSIS:  MR AFIB  PROCEDURE:  Procedure(s): TRANSESOPHAGEAL ECHOCARDIOGRAM (TEE) Re-Operation, MINIMALLY INVASIVE MITRAL VALVE (MV) REPLACEMENT.  SURGEON:    Rexene Alberts, MD  ASSISTANTS:  John Giovanni, PA-C  ANESTHESIA:   Albertha Ghee, MD  CROSSCLAMP TIME:   0  CARDIOPULMONARY BYPASS TIME: 318'  FINDINGS:  Degenerative mitral valve disease with Type I and Type II dysfunction with severe annular dilatation and flail chordae tendinae to anterior leaflet  Severe mitral regurgitation  Normal LV systolic function  Severe LA chamber enlargement  Unsuccessful attempt at mitral valve repair - valve replaced using chord preserving technique  Mitral Valve Etiology  MV Insufficiency: Severe  MV Disease: YES  MV Stenosis: No mitral valve stenosis.  MV Disease Functional Class: MV Disease Functional Class: Type II.and TYPE 1  Etiology (Choose at least one and up to five): Degenerative.  MV Lesions (Choose at least one): Annular dilation. Ruptured chordae tendinae  Mitral/Tricuspid/Pulmonary Valve Procedure  Mitral Valve Procedure Performed:  Replacement: Repair attempted prior to replacement.  Both Anterior and Posterior Mitral Chords Preserved.  Implant: Mechanical Valve: Implant model number [id number], Size 33, Unique Device Identifier [id number].  COMPLICATIONS: None  BASELINE WEIGHT: 111 kg  PATIENT DISPOSITION:   TO SICU IN STABLE CONDITION  Rexene Alberts 01/24/2015 5:55 PM

## 2015-01-24 NOTE — Anesthesia Procedure Notes (Addendum)
Procedure Name: Intubation Date/Time: 01/24/2015 8:54 AM Performed by: Barrington Ellison Pre-anesthesia Checklist: Patient identified, Emergency Drugs available, Suction available, Patient being monitored and Timeout performed Patient Re-evaluated:Patient Re-evaluated prior to inductionOxygen Delivery Method: Circle system utilized Preoxygenation: Pre-oxygenation with 100% oxygen Intubation Type: IV induction Ventilation: Mask ventilation without difficulty, Oral airway inserted - appropriate to patient size and Two handed mask ventilation required Laryngoscope Size: Mac and 3 Grade View: Grade I Tube type: Oral Endobronchial tube: Left and Double lumen EBT and 37 Fr Number of attempts: 1 Airway Equipment and Method: Stylet Placement Confirmation: ETT inserted through vocal cords under direct vision,  positive ETCO2 and breath sounds checked- equal and bilateral Secured at: 27 cm Tube secured with: Tape Dental Injury: Teeth and Oropharynx as per pre-operative assessment    Procedure Name: Intubation Date/Time: 01/24/2015 6:14 PM Performed by: Sampson Si E Pre-anesthesia Checklist: Patient identified, Emergency Drugs available, Suction available and Patient being monitored Oxygen Delivery Method: Circle system utilized Preoxygenation: Pre-oxygenation with 100% oxygen Tube type: Subglottic suction tube Tube size: 8.0 mm Number of attempts: 1 Airway Equipment and Method: Stylet and Video-laryngoscopy Placement Confirmation: ETT inserted through vocal cords under direct vision,  positive ETCO2 and breath sounds checked- equal and bilateral Secured at: 23 cm Tube secured with: Tape Dental Injury: Teeth and Oropharynx as per pre-operative assessment

## 2015-01-24 NOTE — Progress Notes (Signed)
T[facility] BRIEF SICU PROGRESS NOTE  Day of Surgery  S/P Procedure(s) (LRB): TRANSESOPHAGEAL ECHOCARDIOGRAM (TEE) (N/A) Re-Operation, MINIMALLY INVASIVE MITRAL VALVE (MV) REPLACEMENT. (Right)   Sedated on vent.  Has woken up and followed commands, now paralyzed and sedated to facilitate improved ventilation Afib w/ controlled rate, VVI pacing backup Hemodynamics stable on low dose milrinone and Neo drips O2 sats now >90% on 100% FiO2 and PEEP=20 with patient positioned left side down Frothy secretions initially, now minimal secretions Breath sounds coarse on right, clear on left Chest tube output low, thin serosanguinous.  Small intermittent air leak on right Labs okay w/ Hgb 13 Platelet count 181k CXR w/ some patchy opacity right upper lobe  Plan: Keep sedated and paralyzed overnight as per Pulm/CCM team.  Wean FiO2 and PEEP as tolerated.    Rexene Alberts 01/24/2015 7:52 PM

## 2015-01-24 NOTE — Interval H&P Note (Signed)
History and Physical Interval Note:  01/24/2015 6:47 AM  Lance Cook  has presented today for surgery, with the diagnosis of MR AFIB  The various methods of treatment have been discussed with the patient and family. After consideration of risks, benefits and other options for treatment, the patient has consented to  Procedure(s): MINIMALLY INVASIVE MITRAL VALVE REPAIR OR REPLACEMENT (Right) TRANSESOPHAGEAL ECHOCARDIOGRAM (TEE) (N/A) as a surgical intervention .  The patient's history has been reviewed, patient examined, no change in status, stable for surgery.  I have reviewed the patient's chart and labs.  Questions were answered to the patient's satisfaction.     Rexene Alberts

## 2015-01-24 NOTE — Anesthesia Postprocedure Evaluation (Signed)
Anesthesia Post Note  Patient: Lance Cook  Procedure(s) Performed: Procedure(s) (LRB): TRANSESOPHAGEAL ECHOCARDIOGRAM (TEE) (N/A) Re-Operation, MINIMALLY INVASIVE MITRAL VALVE (MV) REPLACEMENT. (Right)  Anesthesia type: General  Patient location: ICU  Post pain: Pain level controlled  Post assessment: Post-op Vital signs reviewed  Last Vitals:  Filed Vitals:   01/24/15 1945  BP: 112/72  Pulse: 80  Temp:   Resp: 22    Post vital signs: stable  Level of consciousness: Patient remains intubated per anesthesia plan  Complications: No apparent anesthesia complications

## 2015-01-24 NOTE — Transfer of Care (Signed)
Immediate Anesthesia Transfer of Care Note  Patient: Lance Cook  Procedure(s) Performed: Procedure(s): TRANSESOPHAGEAL ECHOCARDIOGRAM (TEE) (N/A) Re-Operation, MINIMALLY INVASIVE MITRAL VALVE (MV) REPLACEMENT. (Right)  Patient Location: ICU  Anesthesia Type:General  Level of Consciousness: Patient remains intubated per anesthesia plan  Airway & Oxygen Therapy: Patient remains intubated per anesthesia plan and Patient placed on Ventilator (see vital sign flow sheet for setting)  Post-op Assessment: Report given to RN  Post vital signs: Reviewed and unstable  Last Vitals:  Filed Vitals:   01/24/15 0725  BP: 120/62  Pulse: 66  Temp: 37.1 C  Resp: 18    Complications: respiratory complications

## 2015-01-24 NOTE — Consult Note (Signed)
PULMONARY / CRITICAL CARE MEDICINE   Name: Lance Cook MRN: [id number] DOB: [birth year]    ADMISSION DATE:  01/24/2015 CONSULTATION DATE:  01/24/2015  REFERRING MD :  Roxy Manns  CHIEF COMPLAINT:  Hypoxemia post op  INITIAL PRESENTATION: 54 y.o. M with extensive cardiac hx including severe MR.  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 Roxy Manns.  Following procedure, remained hypoxemic and had edema vs ARDS on CXR.  PCCM consulted for recs.   STUDIES:  CXR 5/25 >>> RUL opacity c/ edema vs ARDS vs ATX  SIGNIFICANT EVENTS: 5/25 - OR for MVR, back to ICU and hypoxemic.  Edema vs ARDS.   HISTORY OF PRESENT ILLNESS:  Pt is encephalopathic; therefore, this HPI is obtained from chart review. Lance Cook is a 54 y.o. M with PMH as outlined below including extensive cardiac hx with prior Bentall aortic root replacement in 1999 as well as CABG x 1 (for full details on his prior cardiac surgeries, please refer to Dr. Guy Sandifer H&P).    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   He was taken to the OR 01/24/15 and had reoperation for minimally invasive MVR.  Cardiopulmonary bypass was apparently 318 minutes.  Following procedure, pt was bagged en route back to ICU.  After arrival in SICU, he remained significantly hypoxic despite multiple modes of ventilation trials and recruitment maneuvers.  CXR revealed RUL opacity concerning for atx vs edema vs ARDS.  PCCM was consulted for recs.  Pt was positioned in left lateral decubitus position and was paralyzed.  Sats gradually improved on PRVC and maintained at 90%.   PAST MEDICAL HISTORY :   has a past medical history of Hyperlipidemia; Hypertension; Arthritis; Leg pain (06/28/2010); Hiatal hernia; Atrial fibrillation; Reflux (11/06/09); Myocardial infarction (age 71); Varicose veins; Obesity; Peripheral vascular disease; Gout; CAD (coronary artery disease); Chronic LBP (10/08/2011); Impaired glucose  tolerance (10/08/2011); Erectile dysfunction (10/08/2011); Testicular cancer; Bell's palsy; TIA (transient ischemic attack); CVA (cerebral infarction); OSA (obstructive sleep apnea); History of colon polyps; Anemia; Hypogonadism male (04/08/2012); Aortic aneurysm; Aortic aneurysm and dissection; 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  has past surgical history that includes Laser ablation (03/06/2010); Cardiac valve replacement; Cholecystectomy (2011); Tonsillectomy (1967); Otoplasty; Orchiectomy (age 3); TEE without cardioversion (N/A, 10/11/2014); Bentall procedure (1988); Coronary artery bypass graft; and aortic truck. Prior to Admission medications   Medication Sig Start Date End Date Taking? Authorizing Provider  allopurinol (ZYLOPRIM) 300 MG tablet Take 300 mg by mouth daily. 11/17/12  Yes Biagio Borg, MD  atenolol (TENORMIN) 100 MG tablet Take 100 mg by mouth 2 (two) times daily. 04/14/14  Yes [name], MD  b complex vitamins tablet Take 1 tablet by mouth daily.     Yes Historical Provider, MD  busPIRone (BUSPAR) 7.5 MG tablet Take 7.5 mg by mouth 2 (two) times daily.    Yes Historical Provider, MD  Calcium-Magnesium-Zinc (CAL-MAG-ZINC PO) Take by mouth daily.     Yes Historical Provider, MD  CARTIA XT 120 MG 24 hr capsule Take 120 mg by mouth daily.  09/27/14  Yes Historical Provider, MD  clindamycin (CLEOCIN) 300 MG capsule  Take 2 capsules by mouth one hour prior to dental appointment. 11/14/14  Yes Lenn Cal, DDS  digoxin (LANOXIN) 0.25  MG tablet Take 1 tablet (0.25 mg total) by mouth daily. 01/02/15  Yes [name], MD  enoxaparin (LOVENOX) 100 MG/ML injection Inject 1 mL (100 mg total) into the skin every 12 (twelve) hours. 12/27/14  Yes [name], MD  furosemide (LASIX) 40 MG tablet Take 20-40 mg by mouth daily as needed for fluid. 10/08/11  Yes Biagio Borg, MD  methocarbamol (ROBAXIN) 500 MG tablet Take 500 mg by mouth 3 (three) times daily.  10/08/11  Yes Biagio Borg, MD  ONE TOUCH ULTRA TEST test strip  09/04/14  Yes Historical Provider, MD  oxyCODONE (OXY IR/ROXICODONE) 5 MG immediate release tablet Take 5 mg by mouth every 4 (four) hours as needed for moderate pain.  09/08/14  Yes Historical Provider, MD  potassium chloride (K-DUR) 10 MEQ tablet Take 10 mEq by mouth 2 (two) times daily.    Yes Historical Provider, MD  temazepam (RESTORIL) 15 MG capsule Take 15 mg by mouth at bedtime as needed for sleep.   Yes Historical Provider, MD  warfarin (COUMADIN) 5 MG tablet Take 1 to 1.5 tablets by mouth daily as directed by coumadin clinic Patient taking differently: Take 5-7.5 mg by mouth See admin instructions. 7.5mg  on Wednesdays, 5mg  all other days 10/14/14  Yes [name], MD   Allergies  Allergen Reactions  . Penicillins     REACTION: Hives  . Adhesive [Tape] Other (See Comments)    Shin irritation    FAMILY HISTORY:  Family History  Problem Relation Age of Onset  . Heart disease Mother   . Throat cancer Mother   . Diabetes Father   . Stroke Other   . Hypertension Other   . Cancer Maternal Aunt     lung, brain  . Cancer Maternal Uncle     brain aneurysm    SOCIAL HISTORY:  reports that he has been smoking Cigars.  He has never used smokeless tobacco. He reports that he drinks alcohol. He reports that he does not use illicit drugs.  REVIEW OF SYSTEMS:  Unable to complete as pt is encephalopathic.  SUBJECTIVE: sedated, hypoxic  VITAL SIGNS: Temp:  [95.9 F (35.5 C)-98.7 F (37.1 C)] 95.9 F (35.5  C) (05/25 1900) Pulse Rate:  [66-80] 80 (05/25 1900) Resp:  [18-31] 21 (05/25 1900) BP: (120)/(62) 120/62 mmHg (05/25 0725) SpO2:  [68 %-97 %] 82 % (05/25 1900) Arterial Line BP: (84-108)/(44-68) 108/67 mmHg (05/25 1900) Weight:  [111.018 kg (244 lb 12 oz)] 111.018 kg (244 lb 12 oz) (05/25 0725) HEMODYNAMICS:   VENTILATOR SETTINGS:   INTAKE / OUTPUT: Intake/Output      05/25 0701 - 05/26 0700   I.V. (mL/kg) 2500 (22.5)   Blood 1350   Total Intake(mL/kg) 3850 (34.7)   Urine (mL/kg/hr) 975 (0.7)   Blood 5500 (3.9)   Total Output 6475   Net -2625         PHYSICAL EXAMINATION: General: WDWN male, sedated. Neuro: Sedated and paralyzed.  RASS - 5. HEENT: [place]/AT. PERRL, sclerae anicteric. Cardiovascular: RRR, SEM. Lungs: Coarse rales throughout right lung with expiratory wheezes bilaterally.  Right sided chest tubes in place. Abdomen: BS x 4, soft, NT/ND.  Musculoskeletal: No gross deformities, no edema.  Skin: Intact, warm, no rashes.  LABS:  CBC  Recent Labs Lab 01/22/15 1243  01/24/15 1530 01/24/15 1603 01/24/15 1633 01/24/15 1850  WBC  8.2  --   --   --   --  24.7*  HGB 14.0  < > 9.9* 10.2* 11.9* 13.2  HCT 43.9  < > 31.1* 30.0* 35.0* 41.3  PLT 147*  --  90*  --   --  181  < > = values in this interval not displayed. Coag's  Recent Labs Lab 01/22/15 1243 01/24/15 1850  APTT 41* 41*  INR 1.36 1.68*   BMET  Recent Labs Lab 01/22/15 1243  01/24/15 1502 01/24/15 1603 01/24/15 1633  NA 138  < > 139 139 140  K 4.4  < > 3.5 4.8 3.8  CL 104  < > 103 103 102  CO2 26  --   --   --   --   BUN 15  < > 15 16 15   CREATININE 0.77  < > 0.60* 0.60* 0.70  GLUCOSE 101*  < > 134* 122* 154*  < > = values in this interval not displayed. Electrolytes  Recent Labs Lab 01/22/15 1243  CALCIUM 8.6*   Sepsis Markers No results for input(s): LATICACIDVEN, PROCALCITON, O2SATVEN in the last 168 hours. ABG  Recent Labs Lab 01/24/15 1108 01/24/15 1637 01/24/15 1846   PHART 7.363 7.223* 7.361  PCO2ART 45.4* 59.3* 42.5  PO2ART 393.0* 76.0* 37.0*   Liver Enzymes  Recent Labs Lab 01/22/15 1243  AST 21  ALT 17  ALKPHOS 47  BILITOT 1.0  ALBUMIN 2.9*   Cardiac Enzymes No results for input(s): TROPONINI, PROBNP in the last 168 hours. Glucose  Recent Labs Lab 01/22/15 1057 01/24/15 0702  GLUCAP 110* 101*    Imaging Dg Chest Port 1 View  01/24/2015   CLINICAL DATA:  Status post mitral valve replacement.  EXAM: PORTABLE CHEST - 1 VIEW  COMPARISON:  Jan 22, 2015.  FINDINGS: Stable cardiomegaly. Status post cardiac valve repair. Endotracheal tube is in grossly good position with distal tip 2 cm above the carina. Nasogastric tube is seen entering the stomach. Left internal jugular Swan-Ganz catheter is noted with distal tip in expected position of main pulmonary artery. Left lung is clear. Two right-sided chest tubes are noted with significant right upper lobe lung opacity consistent with atelectasis. Subcutaneous emphysema is seen over right lateral chest wall. No pneumothorax is noted.  IMPRESSION: Status post cardiac valve repair. Endotracheal and nasogastric tubes are in grossly good position. Two right-sided chest tubes are noted without definite pneumothorax, although right upper lobe opacity is noted concerning for atelectasis or possibly edema.   Electronically Signed   By: Marijo Conception, M.D.   On: 01/24/2015 19:11     ASSESSMENT / PLAN:  PULMONARY OETT 5/25 >>> A: VDRF following MVR 5/25 ARDS vs unilateral re-expansion pulmonary edema (favor ALI rt lung) Hx OSA  P:   Full mechanical support High PEEP ventilation required Allow permissive hypercapnia Tolerate pH > 7.2 for now Wean PEEP as able. Maintain sats > 90% VAP bundle. Xopenex PRN. ABG and CXR in AM.  CARDIOVASCULAR R IJ Swan 5/25 >>> L radial A line 5/25 >>> A:  Cardiogenic shock Severe MR - s/p minimally invasive MVR 01/24/15 H/o CAD, HTN, HLD Chronic dCHF Hx  PAF  P:  Management per TCTS Currently requiring NE, phenylephrine, milrinone MAP goal > 65 Nitroglycerine gtt per cvts Monitor hemodynamics, pa d Volume per TCTS  RENAL A:   No acute issues  P:   Follow Bmet  GASTROINTESTINAL A:   GI prophylaxis Nutrition GERD P:   SUP:  IV Pantoprazole. NPO.  HEMATOLOGIC A:   VTE Prophylaxis  P:  SCD Hold chemoprophylaxis for now Follow CBC  INFECTIOUS A:   Surgical prophylaxis At risk PNA from atx  P:   Abx: Cefuroxime, start date 5/25 > Abx: Vancomycin, start date 5/25 > Follow WBC and fever curve  ENDOCRINE A:   DM  P:   Insulin gtt CBG monitoring  NEUROLOGIC A:   Acute metabolic encephalopathy  P:   Sedation:  Fentanyl, Versed Nimbex for NMB BIS monitoring, TO4 RASS goal: -5 while paralyzed. Daily WUA.   Family updated:   Interdisciplinary Family Meeting v Palliative Care Meeting:  Due by: 6/1   Georgann Housekeeper, AGACNP-BC [place] [facility] Pager [phone] or [phone]  01/24/2015 8:50 PM  STAFF NOTE: Linwood Dibbles, MD FACP have personally reviewed patient's available data, including medical history, events of note, physical examination and test results as part of my evaluation. I have discussed with resident/NP and other care providers such as pharmacist, RN and RRT. In addition, I personally evaluated patient and elicited key findings of: ronchi rt, sedated but not paralyzed yet, pcxr c/w ALI rt, not impressed for pulm edema, spent some time at bedside attempting PCV with reverse ratio 2:1, did very poorly with PCV so went back to [facility] with elevated peep to 20, increased peak flow rate with good lung down, seemed to slowly recruit with this approach, would accept permissive hypercapnia to get TV down from OR 10 cc/kg to 8 cc/kg then likely to 7, would NOT start bicarb in this circumstance,  Will not place on ARDS protocol but manage peep/ fio2 per ccm to avoid high  pressures and barotrauma, repeat abg with some rate increase just made, goals will be sats 90% with prior PA htn pre op, goal to peep 16 then fio2 reduction if able, may need lasix if output drops to avoid overload contribution, we also increased  HOB to improve lung excursion / volumes, have updated hs wife at bedside, would NOT [place] further PCV or APRV. The patient is critically ill with multiple organ systems failure and requires high complexity decision making for assessment and support, frequent evaluation and titration of therapies, application of advanced monitoring technologies and extensive interpretation of multiple databases.   Critical Care Time devoted to patient care services described in this note is 45 Minutes. This time reflects time of care of this signee: Merrie Roof, MD FACP. This critical care time does not reflect procedure time, or teaching time or supervisory time of PA/NP/Med student/Med Resident etc but could involve care discussion time. Rest per NP/medical resident whose note is outlined above and that I agree with   Lavon Paganini. Titus Mould, MD, [place] Pgr: [place] [facility] 01/24/2015 9:33 PM

## 2015-01-24 NOTE — Op Note (Addendum)
CARDIOTHORACIC SURGERY OPERATIVE NOTE  Date of Procedure:  01/24/2015  Preoperative Diagnosis:   Severe Mitral Regurgitation  S/P Bentall Aortic Root Replacement  Postoperative Diagnosis: Same  Procedure:    Reoperation for Minimally-Invasive Mitral Valve Replacement  St. Jude bileaflet mechanical valve (size 25mm, catalog # [id number], serial # [id number])   Surgeon: Valentina Gu. Roxy Manns, MD  Assistant: John Giovanni, PA-C  Anesthesia: Albertha Ghee, MD  Operative Findings:  Degenerative mitral valve disease with Type I and Type II dysfunction including severe annular dilatation and flail chordae tendinae to anterior leaflet  Severe mitral regurgitation  Normal LV systolic function  Severe LA chamber enlargement  Pulmonary hypertension at baseline  Increased oxygen requirements at baseline  Unsuccessful attempt at mitral valve repair - valve replaced using chord preserving technique              BRIEF CLINICAL NOTE AND INDICATIONS FOR SURGERY  Patient is a 54 year old morbidly obese white male with complex past medical history including Bentall aortic root replacement using a mechanical valve conduit with single-vessel coronary artery bypass grafting in the distant past who has been referred for possible surgical treatment of severe symptomatic mitral regurgitation. 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 At the time the patient lived in [place], where the diagnosis was made. The patient traveled to [facility] where he underwent urgent surgery by Dr. Blase Mess at [facility]. According to the patient his aortic valve and entire ascending thoracic aorta was replaced using a 25 mm St. Jude mechanical valve conduit. Single-vessel coronary artery bypass grafting to the right  coronary artery was performed at the time because of acute myocardial infarction. He does not recall being told whether or not he had a bicuspid aortic valve or evidence for other valvular heart disease at the time. He recovered from the surgery uneventfully. He does not recall ever being told about any residual chronic aortic dissection or false aneurysm. Over the past 25 years the patient has developed progressive symptoms of exertional shortness of breath, orthopnea, and severe bilateral lower extremity edema consistent with chronic diastolic congestive heart failure. He developed chronic persistent atrial fibrillation for which he has been managed on medical therapy for at least 15 years. He suffered a minor stroke and another brief transient ischemic attack in the distant past, and both were associated with times when Coumadin therapy was subtherapeutic. The patient and his wife moved to [place] in 2008, and he has been followed for the last several years by Dr. [name]. Recent follow-up transthoracic echocardiogram demonstrated the presence of mitral valve prolapse with possible ruptured chordae tendineae, and subsequent transesophageal echocardiogram confirmed similar findings with likely severe mitral regurgitation. The patient was referred for surgical consultation. Patient returns to the office today for follow-up of severe symptomatic primary mitral regurgitation.  The patient has been seen in consultation and counseled at length regarding the indications, risks and potential benefits of surgery.  All questions have been answered, and the patient provides full informed consent for the operation as described.    DETAILS OF THE OPERATIVE PROCEDURE  Preparation:  The patient is brought to the operating room on the above mentioned date and central monitoring was established by the anesthesia team including placement of Swan-Ganz catheter through the left internal jugular vein.  There was  pulmonary hypertension with  PA pressures measured > 70 mmHg at baseline.  A radial arterial line is placed. The patient is placed in the supine position on the operating table.  Intravenous antibiotics are administered. General endotracheal anesthesia is induced uneventfully. The patient is initially intubated using a dual lumen endotracheal tube.  A Foley catheter is placed.  From the very beginning of the procedure the patient's oxygen requirements were high and he did not tolerate periods of single lung ventilation very long at all.  Baseline transesophageal echocardiogram was performed.  Findings were notable for normal functioning bileaflet mechanical valve in the aortic position with expected mild aortic insufficiency.  There was severe mitral regurgitation.  There was severe annular dilatation and a single ruptured chordae tendinae.  There was normal LV systolic function.  There was severe LA enlargement.  There was normal RV size and systolic function.  A soft roll is placed behind the patient's left scapula and the neck gently extended and turned to the left.   The patient's right neck, chest, abdomen, both groins, and both lower extremities are prepared and draped in a sterile manner. A time out procedure is performed.  Surgical Approach:  A right anterolateral thoracotomy incision is performed. The incision is placed just lateral to and superior to the right nipple. The pectoralis major muscle is retracted medially and completely preserved. The right pleural space is entered through the 3rd intercostal space. A soft tissue retractor is placed.  There are adhesions between the medial surface of the right middle lobe and the mediastinum which are divided using electrocautery. The external surface of the lung looks otherwise normal.  Two 11 mm ports are placed through separate stab incisions inferiorly. The right pleural space is insufflated continuously with carbon dioxide gas through the posterior  port during the remainder of the operation.  A pledgeted suture is placed through the dome of the right hemidiaphragm and retracted inferiorly to facilitate exposure.     Extracorporeal Cardiopulmonary Bypass and Myocardial Protection:  A small incision is made in the right inguinal crease and the anterior surface of the right common femoral artery and right common femoral vein are identified.  The patient is placed in Trendelenburg position. The right internal jugular vein is cannulated with Seldinger technique and a guidewire advanced into the right atrium. The patient is heparinized systemically. The right internal jugular vein is cannulated with a 14 [place] pediatric femoral venous cannula. Pursestring sutures are placed on the anterior surface of the right common femoral vein and right common femoral artery. The right common femoral vein is cannulated with the Seldinger technique and a guidewire is advanced under transesophageal echocardiogram guidance through the right atrium. The femoral vein is cannulated with a long 22 French femoral venous cannula. The right common femoral artery is cannulated with Seldinger technique and a flexible guidewire is advanced until it can be appreciated intraluminally in the descending thoracic aorta on transesophageal echocardiogram. The femoral artery is cannulated with an 18 French femoral arterial cannula.  Adequate heparinization is verified.     The entire pre-bypass portion of the operation was notable for stable hemodynamics.  Cardiopulmonary bypass was begun.  Vacuum assist venous drainage is utilized. Venous drainage and exposure are notably excellent. A longitudinal incision is made in the pericardium approximately 3 cm anterior to the phrenic nerve. Sharp dissection is utilized to develop the space between the pericardium and the epicardial surface of the right atrium. There are adhesions from the patient's previous surgery. Dissection is continued and  the  incision in the pericardium extended in both directions. Ultimately the intra-atrial groove is developed posteriorly. The inferior surface of the right ventricle is identified. Silk traction sutures are placed in the pericardium to facilitate exposure. A pair of bipolar epicardial pacing wires are placed into the inferior surface of the right ventricular free wall.  The patient is cooled to 28C systemic temperature.   Rapid ventricular pacing is utilized to initiate ventricular fibrillation. The subsequent mitral valve procedures performed under cold fibrillatory arrest.    Mitral Valve Repair:  A left atriotomy incision was performed through the interatrial groove and extended partially across the back wall of the left atrium after opening the oblique sinus inferiorly.  The left atrium is very large and thin-walled. The mitral valve is exposed using a self-retaining retractor.  Exposure is affected by the presence of the patient's previous aortic root replacement but overall visualization of the mitral valve is satisfactory.  The mitral valve was inspected and notable for degenerative disease with a single ruptured chordae tendineae from the middle scallop of the anterior leaflet.  The ruptured chordae tendineae is excised. There is severe annular dilatation. There is some restriction in the P1 region of the posterior leaflet.  Interrupted 2-0 Ethibond horizontal mattress sutures are placed circumferentially around the entire mitral valve annulus. The sutures will ultimately be utilized for ring annuloplasty, and at this juncture there are utilized to suspend the valve symmetrically.  Suture plication of the anterior commissure to correct the restriction in the P1 segment of the posterior leaflet is performed using everting 40 proline suture.  The valve was tested with saline and appeared reasonably competent even without ring annuloplasty complete. The valve was sized to a 34 mm annuloplasty ring,  based upon the transverse distance between the left and right commissures and the height of the anterior leaflet, corresponding to a size just slightly larger than the overall surface area of the anterior leaflet.  A Sorin Memo 3D annuloplasty ring (size 39mm, catalog [id number], serial [id number]) was secured in place uneventfully. All ring sutures were secured using a Cor-knot device.  The valve was tested with saline and appeared competent with exception of residual leak through the base of the P1 portion of the posterior leaflet. At this location it appears that one of the annuloplasty sutures had pulled through the base of the leaflet. This is repaired using a series of pledgeted 2-0 Ethibond mattress sutures. Once this has been repaired there remains mild leak near the anterior commissure related to restriction of the P1 segment of the posterior leaflet. Several additional everting sutures were placed to reapproximate A1 to P1.  The leak in this region seems to get worse. A decision is made to abort the valve repair and proceed with definitive valve replacement.   Mitral Valve Replacement:  The annuloplasty ring and all associated sutures are removed. In the region along the base of the P1 segment of the posterior leaflet there appears to be some annular disruption. This is repaired using several interrupted 3-0 proline pledgeted sutures placed from the ventricular to the atrial surface. In addition, a patch of the patient's autologous pericardium was utilized to repair a defect in the posterior wall of the left atrium.  The majority of the anterior leaflet of the mitral valve is excised, preserving small portion of the free margin of the anterior leaflet including chordae tendineae from both the anterior and the posterior papillary muscle. These are incorporated in the valve replacement suture line  laterally. The posterior leaflet is split in the midline and debridement.  Mitral valve replacement is  performed using interrupted 2-0 Ethibond horizontal mattress pledgeted sutures with pledgets in the supra-annular position. The valve is sized to accept a 33 mm bileaflet mechanical prosthesis. Mitral valve replacements performed using a St. Jude bileaflet mechanical valve (size 53mm, catalog [id number], serial [id number]).  The valve is secured in place and all sutures secured with the Cor-knot device.  The valve is carefully inspected to make sure that both leaflets open and close normally without any sign of impingement and the subvalvular apparatus. There is no sign of perivalvular communication. Rewarming is begun.   Procedure Completion:  The atriotomy was closed using a 2-layer closure of running 3-0 Prolene suture after placing a sump drain across the mitral valve to serve as a left ventricular vent.  The patient is cardioverted and ventricular pacing begun.  The patient is rewarmed to 37C temperature. The left ventricular vent is removed.   The patient is weaned and disconnected from cardiopulmonary bypass.  The patient's rhythm at separation from bypass was ventricular paced.  The patient was weaned from bypass on low dose milrinone infusion. Total cardiopulmonary bypass time for the operation was 318 minutes.  Followup transesophageal echocardiogram performed after separation from bypass revealed a well-seated bileaflet mechanical valve in the mitral position with a normal function. There was no paravalvular leak.  Left ventricular function was unchanged from preoperatively.  The bileaflet mechanical valve in aortic position was functioning normally. There was normal right ventricular function and mild tricuspid regurgitation.  The femoral arterial and venous cannulae were removed uneventfully. There was a palpable pulse in the distal right common femoral artery after removal of the cannula. Protamine was administered to reverse the anticoagulation. The right internal jugular cannula was removed  and manual pressure held on the neck for 15 minutes.  Single lung ventilation was begun. The atriotomy closure was inspected for hemostasis. The pericardial sac was drained using a 28 French Bard drain placed through the anterior port incision.  The right pleural space is irrigated with saline solution and inspected for hemostasis. The right pleural space was drained using a 28 French Bard drain placed through the posterior port incision. An additional 50 French straight chest tube was placed to drain the right pleural space over a small rent in the surface of the right lower lobe.  The miniature thoracotomy incision was closed in multiple layers in routine fashion. The right groin incision was inspected for hemostasis and closed in multiple layers in routine fashion.  The post-bypass portion of the operation was notable for stable rhythm and hemodynamics.   Disposition:  The patient tolerated the procedure well.  There were no intraoperative complications. All sponge instrument and needle counts are verified correct at completion of the operation.   The patient was reintubated using a single lumen endotracheal tube and subsequently transported to the surgical intensive care unit in stable condition.  However, by the time the patient arrived in the intensive care unit his oxygen saturations were less than 80% requiring 100% FiO2 and manual bag ventilation for an extended period of time to recover adequate oxygenation.    Valentina Gu. Roxy Manns MD 01/24/2015 6:02 PM

## 2015-01-25 ENCOUNTER — Inpatient Hospital Stay (HOSPITAL_COMMUNITY): Payer: Medicare HMO

## 2015-01-25 DIAGNOSIS — I340 Nonrheumatic mitral (valve) insufficiency: Principal | ICD-10-CM

## 2015-01-25 DIAGNOSIS — J80 Acute respiratory distress syndrome: Secondary | ICD-10-CM

## 2015-01-25 LAB — PREPARE FRESH FROZEN PLASMA
UNIT DIVISION: 0
UNIT DIVISION: 0

## 2015-01-25 LAB — GLUCOSE, CAPILLARY
GLUCOSE-CAPILLARY: 111 mg/dL — AB (ref 65–99)
GLUCOSE-CAPILLARY: 114 mg/dL — AB (ref 65–99)
GLUCOSE-CAPILLARY: 115 mg/dL — AB (ref 65–99)
GLUCOSE-CAPILLARY: 117 mg/dL — AB (ref 65–99)
GLUCOSE-CAPILLARY: 120 mg/dL — AB (ref 65–99)
GLUCOSE-CAPILLARY: 131 mg/dL — AB (ref 65–99)
GLUCOSE-CAPILLARY: 155 mg/dL — AB (ref 65–99)
Glucose-Capillary: 154 mg/dL — ABNORMAL HIGH (ref 65–99)
Glucose-Capillary: 135 mg/dL — ABNORMAL HIGH (ref 65–99)
Glucose-Capillary: 124 mg/dL — ABNORMAL HIGH (ref 65–99)
Glucose-Capillary: 151 mg/dL — ABNORMAL HIGH (ref 65–99)
Glucose-Capillary: 140 mg/dL — ABNORMAL HIGH (ref 65–99)
Glucose-Capillary: 145 mg/dL — ABNORMAL HIGH (ref 65–99)
Glucose-Capillary: 135 mg/dL — ABNORMAL HIGH (ref 65–99)
Glucose-Capillary: 127 mg/dL — ABNORMAL HIGH (ref 65–99)
Glucose-Capillary: 115 mg/dL — ABNORMAL HIGH (ref 65–99)
Glucose-Capillary: 128 mg/dL — ABNORMAL HIGH (ref 65–99)

## 2015-01-25 LAB — CBC
HCT: 44.2 % (ref 39.0–52.0)
HEMATOCRIT: 40.4 % (ref 39.0–52.0)
HEMOGLOBIN: 13.9 g/dL (ref 13.0–17.0)
HEMOGLOBIN: 12.8 g/dL — AB (ref 13.0–17.0)
MCH: 26.2 pg (ref 26.0–34.0)
MCH: 26.3 pg (ref 26.0–34.0)
MCHC: 31.4 g/dL (ref 30.0–36.0)
MCHC: 31.7 g/dL (ref 30.0–36.0)
MCV: 83.4 fL (ref 78.0–100.0)
MCV: 83.1 fL (ref 78.0–100.0)
Platelets: 166 10*3/uL (ref 150–400)
Platelets: 140 10*3/uL — ABNORMAL LOW (ref 150–400)
RBC: 5.3 MIL/uL (ref 4.22–5.81)
RBC: 4.86 MIL/uL (ref 4.22–5.81)
RDW: 16.5 % — ABNORMAL HIGH (ref 11.5–15.5)
RDW: 16.6 % — ABNORMAL HIGH (ref 11.5–15.5)
WBC: 26 10*3/uL — ABNORMAL HIGH (ref 4.0–10.5)
WBC: 21.5 10*3/uL — ABNORMAL HIGH (ref 4.0–10.5)

## 2015-01-25 LAB — POCT I-STAT, CHEM 8
BUN: 22 mg/dL — AB (ref 6–20)
CHLORIDE: 101 mmol/L (ref 101–111)
CREATININE: 1.3 mg/dL — AB (ref 0.61–1.24)
Calcium, Ion: 1.12 mmol/L (ref 1.12–1.23)
GLUCOSE: 137 mg/dL — AB (ref 65–99)
HCT: 41 % (ref 39.0–52.0)
HEMOGLOBIN: 13.9 g/dL (ref 13.0–17.0)
Potassium: 4.1 mmol/L (ref 3.5–5.1)
SODIUM: 136 mmol/L (ref 135–145)
TCO2: 20 mmol/L (ref 0–100)

## 2015-01-25 LAB — POCT I-STAT 3, ART BLOOD GAS (G3+)
ACID-BASE DEFICIT: 4 mmol/L — AB (ref 0.0–2.0)
ACID-BASE DEFICIT: 3 mmol/L — AB (ref 0.0–2.0)
Acid-base deficit: 3 mmol/L — ABNORMAL HIGH (ref 0.0–2.0)
Bicarbonate: 24.5 mEq/L — ABNORMAL HIGH (ref 20.0–24.0)
Bicarbonate: 23.5 mEq/L (ref 20.0–24.0)
Bicarbonate: 23.8 mEq/L (ref 20.0–24.0)
O2 SAT: 97 %
O2 Saturation: 87 %
O2 Saturation: 96 %
PH ART: 7.289 — AB (ref 7.350–7.450)
PO2 ART: 93 mmHg (ref 80.0–100.0)
Patient temperature: 37.3
TCO2: 26 mmol/L (ref 0–100)
TCO2: 25 mmol/L (ref 0–100)
TCO2: 25 mmol/L (ref 0–100)
pCO2 arterial: 50.7 mmHg — ABNORMAL HIGH (ref 35.0–45.0)
pCO2 arterial: 52.2 mmHg — ABNORMAL HIGH (ref 35.0–45.0)
pCO2 arterial: 47.2 mmHg — ABNORMAL HIGH (ref 35.0–45.0)
pH, Arterial: 7.262 — ABNORMAL LOW (ref 7.350–7.450)
pH, Arterial: 7.311 — ABNORMAL LOW (ref 7.350–7.450)
pO2, Arterial: 58 mmHg — ABNORMAL LOW (ref 80.0–100.0)
pO2, Arterial: 110 mmHg — ABNORMAL HIGH (ref 80.0–100.0)

## 2015-01-25 LAB — MAGNESIUM
Magnesium: 2.5 mg/dL — ABNORMAL HIGH (ref 1.7–2.4)
Magnesium: 2.1 mg/dL (ref 1.7–2.4)

## 2015-01-25 LAB — CREATININE, SERUM
Creatinine, Ser: 1.43 mg/dL — ABNORMAL HIGH (ref 0.61–1.24)
GFR calc Af Amer: >60 mL/min (ref >60)
GFR calc non Af Amer: 55 mL/min — ABNORMAL LOW (ref >60)

## 2015-01-25 LAB — BASIC METABOLIC PANEL
Anion gap: 9 (ref 5–15)
BUN: 16 mg/dL (ref 6–20)
CO2: 22 mmol/L (ref 22–32)
Calcium: 7.2 mg/dL — ABNORMAL LOW (ref 8.9–10.3)
Chloride: 106 mmol/L (ref 101–111)
Creatinine, Ser: 0.95 mg/dL (ref 0.61–1.24)
GFR calc Af Amer: >60 mL/min (ref >60)
GFR calc non Af Amer: >60 mL/min (ref >60)
Glucose, Bld: 132 mg/dL — ABNORMAL HIGH (ref 65–99)
Potassium: 3.8 mmol/L (ref 3.5–5.1)
Sodium: 137 mmol/L (ref 135–145)

## 2015-01-25 LAB — PREPARE PLATELET PHERESIS
UNIT DIVISION: 0
Unit division: 0

## 2015-01-25 MED ORDER — INSULIN DETEMIR 100 UNIT/ML ~~LOC~~ SOLN
20.0000 [IU] | Freq: Two times a day (BID) | SUBCUTANEOUS | Status: DC
  Administered 2015-01-25 – 2015-02-01 (×16): 20 [IU] via SUBCUTANEOUS
  Filled 2015-01-25 (×18): qty 0.2

## 2015-01-25 MED ORDER — MUPIROCIN 2 % EX OINT
TOPICAL_OINTMENT | Freq: Two times a day (BID) | CUTANEOUS | Status: AC
  Administered 2015-01-25 – 2015-01-26 (×4): via NASAL
  Filled 2015-01-25: qty 22

## 2015-01-25 MED ORDER — INSULIN ASPART 100 UNIT/ML ~~LOC~~ SOLN
0.0000 [IU] | SUBCUTANEOUS | Status: DC
  Administered 2015-01-25 – 2015-01-30 (×13): 2 [IU] via SUBCUTANEOUS

## 2015-01-25 MED ORDER — CHLORHEXIDINE GLUCONATE CLOTH 2 % EX PADS
6.0000 | MEDICATED_PAD | Freq: Every day | CUTANEOUS | Status: AC
  Administered 2015-01-27 – 2015-01-28 (×2): 6 via TOPICAL

## 2015-01-25 MED ORDER — PANTOPRAZOLE SODIUM 40 MG IV SOLR
40.0000 mg | INTRAVENOUS | Status: DC
Start: 2015-01-25 — End: 2015-01-28
  Administered 2015-01-25 – 2015-01-28 (×4): 40 mg via INTRAVENOUS
  Filled 2015-01-25 (×6): qty 40

## 2015-01-25 MED FILL — Sodium Chloride IV Soln 0.9%: INTRAVENOUS | Qty: 5000 | Status: AC

## 2015-01-25 MED FILL — Mannitol IV Soln 20%: INTRAVENOUS | Qty: 500 | Status: AC

## 2015-01-25 MED FILL — Electrolyte-R (PH 7.4) Solution: INTRAVENOUS | Qty: 4000 | Status: AC

## 2015-01-25 MED FILL — Sodium Bicarbonate IV Soln 8.4%: INTRAVENOUS | Qty: 50 | Status: AC

## 2015-01-25 MED FILL — Magnesium Sulfate Inj 50%: INTRAMUSCULAR | Qty: 10 | Status: AC

## 2015-01-25 MED FILL — Lidocaine HCl IV Inj 20 MG/ML: INTRAVENOUS | Qty: 5 | Status: AC

## 2015-01-25 MED FILL — Heparin Sodium (Porcine) Inj 1000 Unit/ML: INTRAMUSCULAR | Qty: 40 | Status: AC

## 2015-01-25 MED FILL — Potassium Chloride Inj 2 mEq/ML: INTRAVENOUS | Qty: 40 | Status: AC

## 2015-01-25 MED FILL — Heparin Sodium (Porcine) Inj 1000 Unit/ML: INTRAMUSCULAR | Qty: 30 | Status: AC

## 2015-01-25 NOTE — Progress Notes (Signed)
Initial Nutrition Assessment  DOCUMENTATION CODES:  Morbid obesity  INTERVENTION:   If TF started, recommend:  Vital High Protein formula -- initiate at 25 ml/hr and increase by 10 ml every 4 hours to goal rate of 65 ml/hr   TF to provide 1560 kcals, 136 gm protein, 1304 ml of free water  NUTRITION DIAGNOSIS:  Inadequate oral intake related to inability to eat as evidenced by NPO status  GOAL:  Provide needs based on ASPEN/SCCM guidelines  MONITOR:  Vent status, Labs, Weight trends, Skin, I & O's  REASON FOR ASSESSMENT:  Ventilator, Low Braden  ASSESSMENT: [name] with with extensive cardiac hx including severe MR; following surgical procedure, pt remained hypoxemic and had edema vs ARDS on CXR.  Patient s/p procedure 5/25: MITRAL VALVE REPLACEMENT (RIGHT)  Patient is currently intubated on ventilator support -- OGT in place MV: 11.6 L/min Temp (24hrs), Avg:97.8 F (36.6 C), Min:95.9 F (35.5 C), Max:100 F (37.8 C)   Limited nutrition focused physical exam completed.  No muscle or subcutaneous fat depletion noticed.  Height:  Ht Readings from Last 1 Encounters:  01/24/15 5\' 1"  (1.549 m)    Weight:  Wt Readings from Last 1 Encounters:  01/25/15 254 lb 3.1 oz (115.3 kg)    Ideal Body Weight:  51 kg  Wt Readings from Last 10 Encounters:  01/25/15 254 lb 3.1 oz (115.3 kg)  01/22/15 244 lb 12.8 oz (111.041 kg)  01/16/15 237 lb (107.502 kg)  12/25/14 237 lb (107.502 kg)  12/18/14 240 lb (108.863 kg)  11/13/14 244 lb (110.678 kg)  10/06/14 242 lb 14.4 oz (110.179 kg)  05/23/14 230 lb (104.327 kg)  04/14/14 231 lb (104.781 kg)  04/02/14 233 lb 14.5 oz (106.1 kg)    BMI:  Body mass index is 48.05 kg/(m^2).  Estimated Nutritional Needs:  Kcal:  1265-1610  Protein:  130-140 gm  Fluid:  per MD  Skin:  Reviewed, no issues  Diet Order:  Diet NPO time specified  EDUCATION NEEDS:  No education needs identified at this  time   Intake/Output Summary (Last 24 hours) at 01/25/15 1209 Last data filed at 01/25/15 1200  Gross per 24 hour  Intake 7802.04 ml  Output   8110 ml  Net -307.96 ml    Last BM:  Not documented  Arthur Holms, RD, LDN Pager #: [phone] After-Hours Pager #: [phone]

## 2015-01-25 NOTE — Progress Notes (Signed)
[address]       [place] [address]             [phone]        CARDIOTHORACIC SURGERY PROGRESS NOTE   R1 Day Post-Op Procedure(s) (LRB): TRANSESOPHAGEAL ECHOCARDIOGRAM (TEE) (N/A) Re-Operation, MINIMALLY INVASIVE MITRAL VALVE (MV) REPLACEMENT. (Right)  Subjective: Paralyzed and sedated on vent.  Oxygenation and resp status much improved overnight.  Objective: Vital signs: BP Readings from Last 1 Encounters:  01/25/15 75/31   Pulse Readings from Last 1 Encounters:  01/25/15 90   Resp Readings from Last 1 Encounters:  01/25/15 0   Temp Readings from Last 1 Encounters:  01/25/15 99.9 F (37.7 C)     Hemodynamics: PAP: (52-65)/(34-45) 55/37 mmHg CO:  [3.3 L/min-5.2 L/min] 4.7 L/min CI:  [1.6 L/min/m2-2.5 L/min/m2] 2.2 L/min/m2  Physical Exam:  Rhythm:   VVI paced  Breath sounds: Coarse but reasonably clear  Heart sounds:  RRR  Incisions:  Dressings dry, intact  Abdomen:  Soft, non-distended  Extremities:  Warm, well-perfused  Chest tubes:  Low volume thin serosanguinous output, no air leak    Intake/Output from previous day: 05/25 0701 - 05/26 0700 In: 8090.5 [I.V.:5410.5; Blood:1350; NG/GT:30; IV Piggyback:1300] Out: 8010 [VZCHY:8502; Emesis/NG output:100; Blood:5500; Chest Tube:770] Intake/Output this shift: Total I/O In: 243.5 [I.V.:193.5; IV Piggyback:50] Out: 45 [Urine:45]  Lab Results:  CBC: Recent Labs  01/24/15 1850 01/24/15 1853 01/25/15 0200  WBC 24.7*  --  26.0*  HGB 13.2 13.6 13.9  HCT 41.3 40.0 44.2  PLT 181  --  166    BMET:  Recent Labs  01/22/15 1243  01/24/15 1633 01/24/15 1853 01/25/15 0200  NA 138  < > 140 139 137  K 4.4  < > 3.8 3.5 3.8  CL 104  < > 102  --  106  CO2 26  --   --   --  22  GLUCOSE 101*  < > 154* 100* 132*  BUN 15  < > 15  --  16  CREATININE 0.77  < > 0.70  --  0.95  CALCIUM 8.6*  --   --   --  7.2*  < > = values in this interval not displayed.   CBG (last 3)   Recent  Labs  01/25/15 0409 01/25/15 0457 01/25/15 0559  GLUCAP 135* 124* 131*    ABG    Component Value Date/Time   PHART 7.262* 01/25/2015 0457   PCO2ART 52.2* 01/25/2015 0457   PO2ART 110.0* 01/25/2015 0457   HCO3 23.5 01/25/2015 0457   TCO2 25 01/25/2015 0457   ACIDBASEDEF 4.0* 01/25/2015 0457   O2SAT 97.0 01/25/2015 0457    CXR: PORTABLE CHEST - 1 VIEW  COMPARISON: 01/24/2015.  FINDINGS: Endotracheal tube, NG tube, Swan-Ganz catheter, 2 right chest tubes in stable position. Cardiomegaly. Cardiac valve replacement. Diffuse right lung infiltrate noted. No pleural effusion. Minimal pneumothorax on the right present. Right chest wall subcutaneous emphysema.  IMPRESSION: 1. Stable lines and tubes. Two chest tubes in unchanged position on the right. Minimal pneumothorax on the right present.  2. Stable cardiomegaly. No pulmonary venous congestion. Prior cardiac valve replacement.  3. Diffuse right lung infiltrate again noted.  Critical Value/emergent results were called by telephone at the time of interpretation on 01/25/2015 at 7:48 am to nurse Altha Harm, who verbally acknowledged these results.   Electronically Signed  By: Marcello Moores Register  On: 01/25/2015 07:53  Assessment/Plan: S/P Procedure(s) (LRB): TRANSESOPHAGEAL ECHOCARDIOGRAM (TEE) (N/A)  Re-Operation, MINIMALLY INVASIVE MITRAL VALVE (MV) REPLACEMENT. (Right)  Overall stable POD1 Hypoxemic post-op VDRF, related to unilateral acute lung injury complicating pre-existing hypoxemia and chronic respiratory insufficiency w/ expected post op volume excess Oxygenation much improved w/ PaO2 110 and O2 sats 100% on 70% FiO2 and PEEP=12 Not ready for acute vent wean but overall doing much better Hemodynamics stable on low dose milrinone but requiring IV Neo and Levophed for BP support Long-standing permanent atrial fibrillation Chronic diastolic CHF Pulmonary hypertension Morbid obesity OSA Type II  diabetes mellitus, excellent glycemic control on insulin drip Chronic venous insufficiency   Stop paralytic agent and allow patient to begin to wake up  Wean Neo and Levophed drips as tolerated  Wean FiO2 and PEEP slowly per Pulm/CCM team  Start lasix drip to facilitate diuresis  Add levemir insulin and wean drip  SCD boots for DVT prophylaxis - start low dose pharmacologic anticoagulation tomorrow if he remains stable  Rexene Alberts 01/25/2015 8:47 AM

## 2015-01-25 NOTE — Progress Notes (Signed)
Patient ID: Lance Cook, male   DOB: 25-Jul-1961, 54 y.o.   MRN: [id number] EVENING ROUNDS NOTE :     [address]       [place] [address]             [phone]                 1 Day Post-Op Procedure(s) (LRB): TRANSESOPHAGEAL ECHOCARDIOGRAM (TEE) (N/A) Re-Operation, MINIMALLY INVASIVE MITRAL VALVE (MV) REPLACEMENT. (Right)  Total Length of Stay:  LOS: 1 day  BP 75/43 mmHg  Pulse 106  Temp(Src) 100.4 F (38 C) (Oral)  Resp 26  Ht 5\' 1"  (1.549 m)  Wt 254 lb 3.1 oz (115.3 kg)  BMI 48.05 kg/m2  SpO2 99%  .Intake/Output      05/26 0701 - 05/27 0700   I.V. (mL/kg) 2122.1 (18.4)   Blood    NG/GT 60   IV Piggyback 300   Total Intake(mL/kg) 2482.1 (21.5)   Urine (mL/kg/hr) 415 (0.3)   Emesis/NG output 100 (0.1)   Blood    Chest Tube 200 (0.1)   Total Output 715   Net +1767.1         . sodium chloride 20 mL/hr at 01/25/15 1900  . sodium chloride    . sodium chloride 20 mL/hr at 01/25/15 1900  . fentaNYL infusion INTRAVENOUS 150 mcg/hr (01/25/15 1900)  . lactated ringers    . lactated ringers    . midazolam (VERSED) infusion 2 mg/hr (01/25/15 1900)  . milrinone 0.3 mcg/kg/min (01/25/15 1900)  . norepinephrine (LEVOPHED) Adult infusion 15.013 mcg/min (01/25/15 1900)  . phenylephrine (NEO-SYNEPHRINE) Adult infusion 150.133 mcg/min (01/25/15 1900)     Lab Results  Component Value Date   WBC 21.5* 01/25/2015   HGB 13.9 01/25/2015   HCT 41.0 01/25/2015   PLT 140* 01/25/2015   GLUCOSE 137* 01/25/2015   CHOL 118 08/18/2012   TRIG 133.0 08/18/2012   HDL 27.20* 08/18/2012   LDLCALC 64 08/18/2012   ALT 17 01/22/2015   AST 21 01/22/2015   NA 136 01/25/2015   K 4.1 01/25/2015   CL 101 01/25/2015   CREATININE 1.30* 01/25/2015   BUN 22* 01/25/2015   CO2 22 01/25/2015   TSH 1.67 10/08/2011   PSA 0.06* 10/08/2011   INR 1.68* 01/24/2015   HGBA1C 5.9* 01/22/2015   MICROALBUR 19.5* 10/08/2011   Remains on vent and pressor support  Cr ok  today  Grace Isaac MD  Beeper [phone] Office [phone] 01/25/2015 7:33 PM

## 2015-01-25 NOTE — Progress Notes (Signed)
PULMONARY / CRITICAL CARE MEDICINE   Name: Lance Cook MRN: [id number] DOB: 10-22-1960    ADMISSION DATE:  01/24/2015 CONSULTATION DATE:  01/25/2015  REFERRING MD :  Roxy Manns  CHIEF COMPLAINT:  Hypoxemia post op  INITIAL PRESENTATION: 54 y.o. M with extensive cardiac hx including severe MR.  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 Roxy Manns via rt thoracotomy.  Following procedure, remained hypoxemic and had edema vs ARDS on CXR.  PCCM consulted for recs. 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  Edema vs ARDS.  SUBJ - Remains deeplys edated BIS 4 paralysed TOF 2/4 Afebrile Good UO Left lateral decub position  VITAL SIGNS: Temp:  [95.9 F (35.5 C)-99.7 F (37.6 C)] 99.7 F (37.6 C) (05/26 0745) Pulse Rate:  [77-93] 91 (05/26 0745) Resp:  [0-31] 0 (05/26 0745) BP: (42-112)/(10-75) 84/29 mmHg (05/26 0700) SpO2:  [68 %-100 %] 100 % (05/26 0745) Arterial Line BP: (80-117)/(44-75) 90/56 mmHg (05/26 0745) FiO2 (%):  [70 %-100 %] 70 % (05/26 0600) Weight:  [115.3 kg (254 lb 3.1 oz)] 115.3 kg (254 lb 3.1 oz) (05/26 0500) HEMODYNAMICS: PAP: (52-65)/(34-45) 56/37 mmHg CO:  [3.3 L/min-5.2 L/min] 4.7 L/min CI:  [1.6 L/min/m2-2.5 L/min/m2] 2.2 L/min/m2 VENTILATOR SETTINGS: Vent Mode:  [-] PRVC FiO2 (%):  [70 %-100 %] 70 % Set Rate:  [26 bmp] 26 bmp Vt Set:  [450 mL] 450 mL PEEP:  [12 cmH20-20 cmH20] 12 cmH20 Plateau Pressure:  [28 cmH20-36 cmH20] 28 cmH20 INTAKE / OUTPUT: Intake/Output      05/25 0701 - 05/26 0700 05/26 0701 - 05/27 0700   I.V. (mL/kg) 5410.5 (46.9)    Blood 1350    NG/GT 30    IV Piggyback 1300    Total Intake(mL/kg) 8090.5 (70.2)    Urine (mL/kg/hr) 1640 (0.6)    Emesis/NG output 100 (0)    Blood 5500 (2)    Chest Tube 770 (0.3)    Total Output 8010     Net +80.5            PHYSICAL  EXAMINATION: General: WDWN male, sedated. Neuro: Sedated and paralyzed.  RASS - 5. HEENT: [place]/AT. PERRL, sclerae anicteric. Cardiovascular: RRR, SEM. Lungs: Coarse rales throughout right lung with expiratory wheezes bilaterally.  Right sided chest tubes in place-air leak + Abdomen: BS x 4, soft, NT/ND.  Musculoskeletal: No gross deformities, no edema.  Skin: Intact, warm, no rashes.  LABS:  CBC  Recent Labs Lab 01/22/15 1243  01/24/15 1530  01/24/15 1850 01/24/15 1853 01/25/15 0200  WBC 8.2  --   --   --  24.7*  --  26.0*  HGB 14.0  < > 9.9*  < > 13.2 13.6 13.9  HCT 43.9  < > 31.1*  < > 41.3 40.0 44.2  PLT 147*  --  90*  --  181  --  166  < > = values in this interval not displayed. Coag's  Recent Labs Lab 01/22/15 1243 01/24/15 1850  APTT 41* 41*  INR 1.36 1.68*   BMET  Recent Labs Lab 01/22/15 1243  01/24/15 1603 01/24/15 1633 01/24/15 1853 01/25/15 0200  NA 138  < > 139 140 139 137  K 4.4  < > 4.8 3.8 3.5 3.8  CL 104  < > 103 102  --  106  CO2 26  --   --   --   --  22  BUN 15  < > 16 15  --  16  CREATININE 0.77  < > 0.60* 0.70  --  0.95  GLUCOSE 101*  < > 122* 154* 100* 132*  < > = values in this interval not displayed. Electrolytes  Recent Labs Lab 01/22/15 1243 01/25/15 0200  CALCIUM 8.6* 7.2*  MG  --  2.5*   Sepsis Markers No results for input(s): LATICACIDVEN, PROCALCITON, O2SATVEN in the last 168 hours. ABG  Recent Labs Lab 01/24/15 2030 01/24/15 2310 01/25/15 0457  PHART 7.252* 7.255* 7.262*  PCO2ART 57.3* 52.5* 52.2*  PO2ART 56.0* 85.0 110.0*   Liver Enzymes  Recent Labs Lab 01/22/15 1243  AST 21  ALT 17  ALKPHOS 47  BILITOT 1.0  ALBUMIN 2.9*   Cardiac Enzymes No results for input(s): TROPONINI, PROBNP in the last 168 hours. Glucose  Recent Labs Lab 01/25/15 0106 01/25/15 0209 01/25/15 0304 01/25/15 0409 01/25/15 0457 01/25/15 0559  GLUCAP 115* 114* 154* 135* 124* 131*    Imaging Dg Chest Port 1  View  01/25/2015   CLINICAL DATA:  Atelectasis.  EXAM: PORTABLE CHEST - 1 VIEW  COMPARISON:  01/24/2015.  FINDINGS: Endotracheal tube, NG tube, Swan-Ganz catheter, 2 right chest tubes in stable position. Cardiomegaly. Cardiac valve replacement. Diffuse right lung infiltrate noted. No pleural effusion. Minimal pneumothorax on the right present. Right chest wall subcutaneous emphysema.  IMPRESSION: 1. Stable lines and tubes. Two chest tubes in unchanged position on the right. Minimal pneumothorax on the right present.  2. Stable cardiomegaly. No pulmonary venous congestion. Prior cardiac valve replacement.  3.  Diffuse right lung infiltrate again noted.  Critical Value/emergent results were called by telephone at the time of interpretation on 01/25/2015 at 7:48 am to nurse Altha Harm, who verbally acknowledged these results.   Electronically Signed   By: Marcello Moores  Register   On: 01/25/2015 07:53   Dg Chest Port 1 View  01/24/2015   CLINICAL DATA:  Status post mitral valve replacement.  EXAM: PORTABLE CHEST - 1 VIEW  COMPARISON:  Jan 22, 2015.  FINDINGS: Stable cardiomegaly. Status post cardiac valve repair. Endotracheal tube is in grossly good position with distal tip 2 cm above the carina. Nasogastric tube is seen entering the stomach. Left internal jugular Swan-Ganz catheter is noted with distal tip in expected position of main pulmonary artery. Left lung is clear. Two right-sided chest tubes are noted with significant right upper lobe lung opacity consistent with atelectasis. Subcutaneous emphysema is seen over right lateral chest wall. No pneumothorax is noted.  IMPRESSION: Status post cardiac valve repair. Endotracheal and nasogastric tubes are in grossly good position. Two right-sided chest tubes are noted without definite pneumothorax, although right upper lobe opacity is noted concerning for atelectasis or possibly edema.   Electronically Signed   By: Marijo Conception, M.D.   On: 01/24/2015 19:11      ASSESSMENT / PLAN:  PULMONARY OETT 5/25 >>> A: VDRF following MVR 5/25 ARDS vs unilateral re-expansion pulmonary edema (favor ALI rt lung) Hx OSA RT pneumothorax due to Rt thoracotomy P:   Full mechanical support Titrate PEEP/FIO2 per ARDS protocol Allow permissive hypercapnia -Tolerate pH > 7.2 for now Wean PEEP as able. Maintain sats > 90%, once PEEP 10 or lower -can attempt to dc paralytic VAP bundle. 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 NPO.  HEMATOLOGIC A:   VTE Prophylaxis  P:  SCD Hold chemoprophylaxis for now Follow CBC  INFECTIOUS A:   Surgical prophylaxis At risk PNA from atx  P:   Abx: Cefuroxime, start date 5/25 > Abx: Vancomycin, start date 5/25 > Follow WBC and fever curve  ENDOCRINE A:   DM  P:   Insulin gtt CBG monitoring  NEUROLOGIC A:   Acute metabolic encephalopathy  P:   Sedation:  Fentanyl, Versed BIS monitoring, TOF - while on nimbex - can dc once off RASS goal: -5 while paralyzed. Daily WUA.  D/w Dr Roxy Manns  Family updated: per Dr Roxy Manns  Interdisciplinary Family Meeting v Palliative Care Meeting:  Due by: 6/1  The patient is critically ill with multiple organ systems failure and requires high complexity decision making for assessment and support, frequent evaluation and titration of therapies, application of advanced monitoring technologies and extensive interpretation of multiple databases. Critical Care Time devoted to patient care services described in this note  independent of APP time is 35 minutes.    Kara Mead MD. Shade Flood. [facility] Pager [phone] If no response call [phone]    01/25/2015 8:04 AM

## 2015-01-25 NOTE — Plan of Care (Signed)
Problem: Phase II - Intermediate Post-Op Goal: Wean to Extubate Outcome: Not Progressing Required paralytic post op to meet oxygenation goal, requiring fentanyl and versed. Also needs to remain on left side doe now.

## 2015-01-26 ENCOUNTER — Inpatient Hospital Stay (HOSPITAL_COMMUNITY): Payer: Medicare HMO

## 2015-01-26 ENCOUNTER — Encounter (HOSPITAL_COMMUNITY): Payer: Self-pay | Admitting: Thoracic Surgery (Cardiothoracic Vascular Surgery)

## 2015-01-26 LAB — GLUCOSE, CAPILLARY
GLUCOSE-CAPILLARY: 128 mg/dL — AB (ref 65–99)
Glucose-Capillary: 108 mg/dL — ABNORMAL HIGH (ref 65–99)
Glucose-Capillary: 119 mg/dL — ABNORMAL HIGH (ref 65–99)
Glucose-Capillary: 134 mg/dL — ABNORMAL HIGH (ref 65–99)
Glucose-Capillary: 145 mg/dL — ABNORMAL HIGH (ref 65–99)

## 2015-01-26 LAB — COMPREHENSIVE METABOLIC PANEL
ALT: 40 U/L (ref 17–63)
ANION GAP: 8 (ref 5–15)
AST: 123 U/L — AB (ref 15–41)
Albumin: 2.1 g/dL — ABNORMAL LOW (ref 3.5–5.0)
Alkaline Phosphatase: 41 U/L (ref 38–126)
BILIRUBIN TOTAL: 1.2 mg/dL (ref 0.3–1.2)
BUN: 22 mg/dL — ABNORMAL HIGH (ref 6–20)
CALCIUM: 7.2 mg/dL — AB (ref 8.9–10.3)
CO2: 23 mmol/L (ref 22–32)
Chloride: 102 mmol/L (ref 101–111)
Creatinine, Ser: 1.45 mg/dL — ABNORMAL HIGH (ref 0.61–1.24)
GFR calc non Af Amer: 54 mL/min — ABNORMAL LOW (ref >60)
Glucose, Bld: 142 mg/dL — ABNORMAL HIGH (ref 65–99)
Potassium: 3.6 mmol/L (ref 3.5–5.1)
Sodium: 133 mmol/L — ABNORMAL LOW (ref 135–145)
TOTAL PROTEIN: 4.1 g/dL — AB (ref 6.5–8.1)

## 2015-01-26 LAB — POCT I-STAT 3, ART BLOOD GAS (G3+)
Acid-base deficit: 1 mmol/L (ref 0.0–2.0)
Bicarbonate: 25.4 mEq/L — ABNORMAL HIGH (ref 20.0–24.0)
O2 Saturation: 99 %
Patient temperature: 100.4
TCO2: 27 mmol/L (ref 0–100)
pCO2 arterial: 49.1 mmHg — ABNORMAL HIGH (ref 35.0–45.0)
pH, Arterial: 7.327 — ABNORMAL LOW (ref 7.350–7.450)
pO2, Arterial: 144 mmHg — ABNORMAL HIGH (ref 80.0–100.0)

## 2015-01-26 LAB — CBC
HEMATOCRIT: 36.5 % — AB (ref 39.0–52.0)
HEMOGLOBIN: 11.9 g/dL — AB (ref 13.0–17.0)
MCH: 26.9 pg (ref 26.0–34.0)
MCHC: 32.6 g/dL (ref 30.0–36.0)
MCV: 82.6 fL (ref 78.0–100.0)
PLATELETS: 120 10*3/uL — AB (ref 150–400)
RBC: 4.42 MIL/uL (ref 4.22–5.81)
RDW: 16.9 % — AB (ref 11.5–15.5)
WBC: 19 10*3/uL — ABNORMAL HIGH (ref 4.0–10.5)

## 2015-01-26 MED ORDER — WARFARIN SODIUM 5 MG PO TABS
5.0000 mg | ORAL_TABLET | Freq: Every day | ORAL | Status: DC
  Administered 2015-01-26: 5 mg
  Filled 2015-01-26 (×2): qty 1

## 2015-01-26 MED ORDER — WARFARIN - PHYSICIAN DOSING INPATIENT
Freq: Every day | Status: DC
  Administered 2015-01-26 – 2015-02-08 (×9)

## 2015-01-26 MED ORDER — SODIUM CHLORIDE 0.9 % IV SOLN
2000.0000 mg | Freq: Once | INTRAVENOUS | Status: AC
  Administered 2015-01-26: 2000 mg via INTRAVENOUS
  Filled 2015-01-26: qty 2000

## 2015-01-26 MED ORDER — POTASSIUM CHLORIDE 10 MEQ/50ML IV SOLN
10.0000 meq | INTRAVENOUS | Status: AC | PRN
  Administered 2015-01-26 (×3): 10 meq via INTRAVENOUS

## 2015-01-26 MED ORDER — METOPROLOL TARTRATE 12.5 MG HALF TABLET
12.5000 mg | ORAL_TABLET | Freq: Two times a day (BID) | ORAL | Status: DC
  Administered 2015-01-26 – 2015-02-01 (×13): 12.5 mg
  Filled 2015-01-26 (×16): qty 1

## 2015-01-26 MED ORDER — VANCOMYCIN HCL IN DEXTROSE 750-5 MG/150ML-% IV SOLN
750.0000 mg | Freq: Two times a day (BID) | INTRAVENOUS | Status: DC
  Administered 2015-01-26 – 2015-01-29 (×6): 750 mg via INTRAVENOUS
  Filled 2015-01-26 (×7): qty 150

## 2015-01-26 MED ORDER — DEXTROSE 5 % IV SOLN
1.0000 g | Freq: Three times a day (TID) | INTRAVENOUS | Status: DC
  Administered 2015-01-26 – 2015-02-01 (×18): 1 g via INTRAVENOUS
  Filled 2015-01-26 (×20): qty 1

## 2015-01-26 MED ORDER — ENOXAPARIN SODIUM 30 MG/0.3ML ~~LOC~~ SOLN
30.0000 mg | SUBCUTANEOUS | Status: DC
  Administered 2015-01-26 – 2015-01-27 (×2): 30 mg via SUBCUTANEOUS
  Filled 2015-01-26 (×3): qty 0.3

## 2015-01-26 MED ORDER — DIGOXIN 250 MCG PO TABS
0.2500 mg | ORAL_TABLET | Freq: Every day | ORAL | Status: DC
  Administered 2015-01-26 – 2015-02-01 (×7): 0.25 mg
  Filled 2015-01-26 (×8): qty 1

## 2015-01-26 MED ORDER — NOREPINEPHRINE BITARTRATE 1 MG/ML IV SOLN
0.0000 ug/min | INTRAVENOUS | Status: DC
  Administered 2015-01-26: 15 ug/min via INTRAVENOUS
  Administered 2015-01-27: 14 ug/min via INTRAVENOUS
  Administered 2015-01-28: 6 ug/min via INTRAVENOUS
  Filled 2015-01-26 (×4): qty 16

## 2015-01-26 NOTE — Progress Notes (Signed)
ANTIBIOTIC CONSULT NOTE - INITIAL  Pharmacy Consult for Vancomycin and Aztreonam Indication: pneumonia  Allergies  Allergen Reactions  . Penicillins     REACTION: Hives  . Adhesive [Tape] Other (See Comments)    Shin irritation    Patient Measurements: Height: 5\' 1"  (154.9 cm) Weight: 256 lb 11.2 oz (116.438 kg) IBW/kg (Calculated) : 52.3 Adjusted Body Weight: n/a  Vital Signs: Temp: 101.7 F (38.7 C) (05/27 1000) Temp Source: Core (Comment) (05/27 0800) BP: 108/46 mmHg (05/27 1000) Pulse Rate: 114 (05/27 1000) Intake/Output from previous day: 05/26 0701 - 05/27 0700 In: 4767.3 [I.V.:4267.3; NG/GT:150; IV Piggyback:350] Out: 2409 [Urine:1065; Emesis/NG output:175; Chest Tube:500] Intake/Output from this shift: Total I/O In: 359.6 [I.V.:259.6; IV Piggyback:100] Out: 140 [Urine:80; Chest Tube:60]  Labs:  Recent Labs  01/25/15 0200 01/25/15 1530 01/25/15 1540 01/26/15 0420  WBC 26.0* 21.5*  --  19.0*  HGB 13.9 12.8* 13.9 11.9*  PLT 166 140*  --  120*  CREATININE 0.95 1.43* 1.30* 1.45*   Estimated Creatinine Clearance: 64.9 mL/min (by C-G formula based on Cr of 1.45). No results for input(s): VANCOTROUGH, VANCOPEAK, VANCORANDOM, GENTTROUGH, GENTPEAK, GENTRANDOM, TOBRATROUGH, TOBRAPEAK, TOBRARND, AMIKACINPEAK, AMIKACINTROU, AMIKACIN in the last 72 hours.   Microbiology: Recent Results (from the past 720 hour(s))  Surgical pcr screen     Status: Abnormal   Collection Time: 01/22/15 12:10 PM  Result Value Ref Range Status   MRSA, PCR POSITIVE (A) NEGATIVE Final   Staphylococcus aureus POSITIVE (A) NEGATIVE Final    Comment:        The Xpert SA Assay (FDA approved for NASAL specimens in patients over 20 years of age), is one component of a comprehensive surveillance program.  Test performance has been validated by [facility] for patients greater than or equal to 64 year old. It is not intended to diagnose infection nor to guide or monitor treatment.     Assessment: 54 yo male s/p minimally invasive MVR 5/25 who remained hypoxemia after procedure.  Pharmacy asked to continue empiric antibiotic coverage with vancomycin and azactam.  Patient did receive post-op doses of cefuroxime, and vancomycin (last dose 1g at 2 AM 5/26).  Scr 1.45, estimated CrCl ~ 65 ml/min.  Goal of Therapy:  Vancomycin trough level 15-20 mcg/ml  Plan:  1. Vancomycin 2g IV x 1 now to re-load, then vancomycin 750 mg IV q 12 hrs. 2. Aztreonam 1g IV q 8 hrs. 3. F/u cultures, clinical course and renal function. 4. Vancomycin trough at steady-state as indicated.  Uvaldo Rising, BCPS  Clinical Pharmacist Pager [phone]  01/26/2015 10:11 AM

## 2015-01-26 NOTE — Progress Notes (Addendum)
TCTS DAILY ICU PROGRESS NOTE                   [address]            [place] [id number]          [phone]   2 Days Post-Op Procedure(s) (LRB): TRANSESOPHAGEAL ECHOCARDIOGRAM (TEE) (N/A) Re-Operation, MINIMALLY INVASIVE MITRAL VALVE (MV) REPLACEMENT. (Right)  Total Length of Stay:  LOS: 2 days   Subjective: Sedated on vent.    Objective: Vital signs in last 24 hours: Temp:  [99.9 F (37.7 C)-101.3 F (38.5 C)] 101.1 F (38.4 C) (05/27 0800) Pulse Rate:  [79-140] 116 (05/27 0828) Cardiac Rhythm:  [-] Atrial fibrillation (05/27 0800) Resp:  [0-27] 26 (05/27 0828) BP: (72-116)/(22-83) 104/55 mmHg (05/27 0828) SpO2:  [96 %-100 %] 96 % (05/27 0828) Arterial Line BP: (73-113)/(49-66) 105/56 mmHg (05/27 0800) FiO2 (%):  [40 %-50 %] 40 % (05/27 0828) Weight:  [256 lb 11.2 oz (116.438 kg)] 256 lb 11.2 oz (116.438 kg) (05/27 0500)  Filed Weights   01/24/15 0725 01/25/15 0500 01/26/15 0500  Weight: 244 lb 12 oz (111.018 kg) 254 lb 3.1 oz (115.3 kg) 256 lb 11.2 oz (116.438 kg)    Weight change: 11 lb 15.2 oz (5.42 kg)   Hemodynamic parameters for last 24 hours: PAP: (44-66)/(32-47) 48/36 mmHg CO:  [4.3 L/min-6.9 L/min] 4.5 L/min CI:  [2.1 L/min/m2-3.3 L/min/m2] 2.1 L/min/m2  Intake/Output from previous day: 05/26 0701 - 05/27 0700 In: 4767.3 [I.V.:4267.3; NG/GT:150; IV Piggyback:350] Out: 1740 [Urine:1065; Emesis/NG output:175; Chest Tube:500]  Intake/Output this shift: Total I/O In: 259.6 [I.V.:209.6; IV Piggyback:50] Out: 140 [Urine:80; Chest Tube:60]  Current Meds: Scheduled Meds: . acetaminophen  1,000 mg Oral 4 times per day   Or  . acetaminophen (TYLENOL) oral liquid 160 mg/5 mL  1,000 mg Per Tube 4 times per day  . antiseptic oral rinse  7 mL Mouth Rinse QID  . aspirin EC  325 mg Oral Daily   Or  . aspirin  324 mg Per Tube Daily  . bisacodyl  10 mg Oral Daily   Or  . bisacodyl  10 mg Rectal Daily  . cefUROXime (ZINACEF)  IV  1.5 g  Intravenous Q12H  . chlorhexidine  15 mL Mouth Rinse BID  . Chlorhexidine Gluconate Cloth  6 each Topical Q0600  . docusate sodium  200 mg Oral Daily  . insulin aspart  0-24 Units Subcutaneous 6 times per day  . insulin detemir  20 Units Subcutaneous BID  . mupirocin ointment   Nasal BID  . pantoprazole (PROTONIX) IV  40 mg Intravenous Q24H  . sodium chloride  3 mL Intravenous Q12H   Continuous Infusions: . sodium chloride 20 mL/hr at 01/26/15 0400  . sodium chloride    . sodium chloride 20 mL/hr at 01/26/15 0400  . fentaNYL infusion INTRAVENOUS 150 mcg/hr (01/26/15 0800)  . lactated ringers    . lactated ringers    . midazolam (VERSED) infusion 2 mg/hr (01/26/15 0800)  . milrinone 0.3 mcg/kg/min (01/26/15 0800)  . norepinephrine (LEVOPHED) Adult infusion 15 mcg/min (01/26/15 0800)  . phenylephrine (NEO-SYNEPHRINE) Adult infusion 150 mcg/min (01/26/15 0800)   PRN Meds:.sodium chloride, fentaNYL, levalbuterol, metoprolol, midazolam, ondansetron (ZOFRAN) IV, potassium chloride, sodium chloride  Physical Exam: General appearance: Sedated on vent Heart: irregularly irregular rhythm, freq PVCs Lungs: Crackles in bases bilaterally Abdomen: soft, non-tender; bowel sounds normal; no masses,  no organomegaly Extremities: +LE edema Wound: Dressed and dry  Lab Results: CBC: Recent Labs  01/25/15 1530 01/25/15 1540 01/26/15 0420  WBC 21.5*  --  19.0*  HGB 12.8* 13.9 11.9*  HCT 40.4 41.0 36.5*  PLT 140*  --  120*   BMET:  Recent Labs  01/25/15 0200  01/25/15 1540 01/26/15 0420  NA 137  --  136 133*  K 3.8  --  4.1 3.6  CL 106  --  101 102  CO2 22  --   --  23  GLUCOSE 132*  --  137* 142*  BUN 16  --  22* 22*  CREATININE 0.95  < > 1.30* 1.45*  CALCIUM 7.2*  --   --  7.2*  < > = values in this interval not displayed.  PT/INR:  Recent Labs  01/24/15 1850  LABPROT 19.8*  INR 1.68*   Radiology: Dg Chest Port 1 View  01/26/2015   CLINICAL DATA:  CHF, history of  valvular heart disease, CABG.  EXAM: PORTABLE CHEST - 1 VIEW  COMPARISON:  Portable chest x-ray of Jan 25, 2015  FINDINGS: The lungs are reasonably well inflated. The interstitial markings remain increased bilaterally. Alveolar opacities on the right have improved slightly. No definite pneumothorax is demonstrated today on the right. The retrocardiac region on the left remains dense and both hemidiaphragms remain obscured. The cardiac silhouette remains enlarged and the pulmonary vascularity is engorged and indistinct. A valve ring in the mitral position is again demonstrated. Two right-sided chest tubes are unchanged in position. The Swan-Ganz catheter tip projects over the distal aspect of the pulmonary artery trunk. The endotracheal tube tip lies 3.8 cm above the carina. The esophagogastric tube tip projects below the inferior margin of the image.  IMPRESSION: 1. The support tubes and lines are in reasonable position. 2. The tiny right apical pneumothorax is not clearly evident today. There has been slight interval improvement and confluent alveolar opacities on the right. The pulmonary interstitial markings remain diffusely increased.   Electronically Signed   By: David  Martinique M.D.   On: 01/26/2015 07:53     Assessment/Plan: S/P Procedure(s) (LRB): TRANSESOPHAGEAL ECHOCARDIOGRAM (TEE) (N/A) Re-Operation, MINIMALLY INVASIVE MITRAL VALVE (MV) REPLACEMENT. (Right)  CV- Permanent AF, rates elevated this am. Also appears to be having freq PVCs, brief runs NSVT on telemetry. Will discuss with MD, may need to consider Amiodarone if pulm status allows.  BPs marginal. Gtts: Milrinone, Neo, Levophed.  Pulm- remains sedated on vent. CCM following.  Chronic diastolic HF/vol overload- diurese as BP allows.  DM- CBGs stable.  AKI- Cr up 0.95->1.3->1.45. Continue to monitor.  Nutrition- If he remains on the vent, will need to consider TNA soon.  ID- febrile overnight, WBC elevated but trending down.  May  need to consider abx, cx's.   COLLINS,GINA H 01/26/2015 9:50 AM  I have seen and examined the patient and agree with the assessment and plan as outlined.  Oxygenation much improved w/ O2 sats 98-100% on 40% FiO2 and PEEP=5.  CXR improved.  UOP starting to increase on lasix drip.  I am hopeful that he might be ready for acute vent wean and extubation by tomorrow morning.  Continue to wean levophed and neo as tolerated.  Continue milrinone for now but will decrease dose to 0.2.    Start low dose lovenox and coumadin.  Will plan to stop lovenox and start heparin drip w/out bolus if INR doesn't increase within the next 24-48 hours.  Discussed patient's current condition and progress over the last 48 hours  with his wife at the bedside.  All questions answered.   Rexene Alberts 01/26/2015 1:17 PM

## 2015-01-26 NOTE — Progress Notes (Signed)
PULMONARY / CRITICAL CARE MEDICINE   Name: Lance Cook MRN: [id number] DOB: 1961/07/15    ADMISSION DATE:  01/24/2015 CONSULTATION DATE:  01/26/2015  REFERRING MD :  Roxy Manns  CHIEF COMPLAINT:  Hypoxemia post op  INITIAL PRESENTATION: 54 y.o. M with extensive cardiac hx including severe MR.  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 Roxy Manns via rt thoracotomy.  Following procedure, remained hypoxemic and had edema vs ARDS on CXR.  PCCM consulted for recs. 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  Edema vs ARDS.  SUBJECTIVE: Remains on multiple pressors, milrinone, full vent support.  VITAL SIGNS: Temp:  [99.9 F (37.7 C)-101.3 F (38.5 C)] 101.1 F (38.4 C) (05/27 0800) Pulse Rate:  [79-140] 116 (05/27 0828) Resp:  [0-27] 26 (05/27 0828) BP: (72-116)/(22-83) 104/55 mmHg (05/27 0828) SpO2:  [96 %-100 %] 96 % (05/27 0828) Arterial Line BP: (73-113)/(49-66) 105/56 mmHg (05/27 0800) FiO2 (%):  [40 %-50 %] 40 % (05/27 0828) Weight:  [256 lb 11.2 oz (116.438 kg)] 256 lb 11.2 oz (116.438 kg) (05/27 0500) HEMODYNAMICS: PAP: (44-66)/(32-47) 48/36 mmHg CO:  [4.3 L/min-6.9 L/min] 4.5 L/min CI:  [2.1 L/min/m2-3.3 L/min/m2] 2.1 L/min/m2 VENTILATOR SETTINGS: Vent Mode:  [-] PRVC FiO2 (%):  [40 %-50 %] 40 % Set Rate:  [26 bmp] 26 bmp Vt Set:  [50 mL-450 mL] 450 mL PEEP:  [5 cmH20-10 cmH20] 5 cmH20 Plateau Pressure:  [22 ACZ66-06 cmH20] 22 cmH20 INTAKE / OUTPUT: Intake/Output      05/26 0701 - 05/27 0700 05/27 0701 - 05/28 0700   I.V. (mL/kg) 4267.3 (36.6) 209.6 (1.8)   Blood     NG/GT 150    IV Piggyback 350 50   Total Intake(mL/kg) 4767.3 (40.9) 259.6 (2.2)   Urine (mL/kg/hr) 1065 (0.4) 80 (0.4)   Emesis/NG output 175 (0.1)    Blood     Chest Tube 500 (0.2) 60 (0.3)   Total Output 1740 140   Net +3027.3 +119.6          PHYSICAL EXAMINATION: General: ill appearing Neuro: RASS -1 HEENT: ETT in place Cardiovascular: regular Lungs: b/l crackles Rt > Lt, chest tubes in place Abdomen: soft, non tender Musculoskeletal: 1+ edema Skin: Intact, warm, no rashes  LABS:  CBC  Recent Labs Lab 01/25/15 0200 01/25/15 1530 01/25/15 1540 01/26/15 0420  WBC 26.0* 21.5*  --  19.0*  HGB 13.9 12.8* 13.9 11.9*  HCT 44.2 40.4 41.0 36.5*  PLT 166 140*  --  120*   Coag's  Recent Labs Lab 01/22/15 1243 01/24/15 1850  APTT 41* 41*  INR 1.36 1.68*   BMET  Recent Labs Lab 01/22/15 1243  01/25/15 0200 01/25/15 1530 01/25/15 1540 01/26/15 0420  NA 138  < > 137  --  136 133*  K 4.4  < > 3.8  --  4.1 3.6  CL 104  < > 106  --  101 102  CO2 26  --  22  --   --  23  BUN 15  < > 16  --  22* 22*  CREATININE 0.77  < > 0.95 1.43* 1.30* 1.45*  GLUCOSE 101*  < > 132*  --  137* 142*  < > = values in this interval not displayed. Electrolytes  Recent Labs Lab 01/22/15 1243 01/25/15  0200 01/25/15 1530 01/26/15 0420  CALCIUM 8.6* 7.2*  --  7.2*  MG  --  2.5* 2.1  --    Sepsis Markers No results for input(s): LATICACIDVEN, PROCALCITON, O2SATVEN in the last 168 hours. ABG  Recent Labs Lab 01/25/15 0457 01/25/15 1536 01/26/15 0425  PHART 7.262* 7.311* 7.327*  PCO2ART 52.2* 47.2* 49.1*  PO2ART 110.0* 93.0 144.0*   Liver Enzymes  Recent Labs Lab 01/22/15 1243 01/26/15 0420  AST 21 123*  ALT 17 40  ALKPHOS 47 41  BILITOT 1.0 1.2  ALBUMIN 2.9* 2.1*   Cardiac Enzymes No results for input(s): TROPONINI, PROBNP in the last 168 hours. Glucose  Recent Labs Lab 01/25/15 1331 01/25/15 1531 01/25/15 2010 01/26/15 01/26/15 0422 01/26/15 0807  GLUCAP 111* 120* 128* 145* 134* 128*    Imaging Dg Chest Port 1 View  01/26/2015   CLINICAL DATA:  CHF, history of valvular heart disease, CABG.  EXAM: PORTABLE CHEST - 1 VIEW  COMPARISON:  Portable chest x-ray of Jan 25, 2015  FINDINGS: The  lungs are reasonably well inflated. The interstitial markings remain increased bilaterally. Alveolar opacities on the right have improved slightly. No definite pneumothorax is demonstrated today on the right. The retrocardiac region on the left remains dense and both hemidiaphragms remain obscured. The cardiac silhouette remains enlarged and the pulmonary vascularity is engorged and indistinct. A valve ring in the mitral position is again demonstrated. Two right-sided chest tubes are unchanged in position. The Swan-Ganz catheter tip projects over the distal aspect of the pulmonary artery trunk. The endotracheal tube tip lies 3.8 cm above the carina. The esophagogastric tube tip projects below the inferior margin of the image.  IMPRESSION: 1. The support tubes and lines are in reasonable position. 2. The tiny right apical pneumothorax is not clearly evident today. There has been slight interval improvement and confluent alveolar opacities on the right. The pulmonary interstitial markings remain diffusely increased.   Electronically Signed   By: David  Martinique M.D.   On: 01/26/2015 07:53     ASSESSMENT / PLAN:  PULMONARY OETT 5/25 >>> A: VDRF following MVR 5/25 ARDS vs unilateral re-expansion pulmonary edema (favor ALI rt lung) Hx OSA RT pneumothorax due to Rt thoracotomy P:   Full mechanical support Titrate PEEP/FIO2 per ARDS protocol Allow permissive hypercapnia -Tolerate pH > 7.2 for now Wean PEEP as able. Maintain sats > 90%, once PEEP 10 or lower -can attempt to dc paralytic VAP bundle. 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 NPO. Defer tube feeds to primary team  HEMATOLOGIC A:   VTE Prophylaxis  P:  SCD Hold chemoprophylaxis for now Follow CBC  INFECTIOUS A:   Surgical prophylaxis At risk PNA from atx  P:   Abx: Cefuroxime, start date 5/25 > Abx: Vancomycin, start date 5/25 > Follow WBC and fever curve  ENDOCRINE A:   DM  P:   SSI CBG monitoring  NEUROLOGIC A:   Acute metabolic encephalopathy  P:   Sedation:  Fentanyl, Versed RASS goal: -1  Daily WUA.  CC time 35 minutes.  Chesley Mires, MD [facility] Pulmonary/Critical Care 01/26/2015, 8:41 AM Pager:  [phone] After 3pm call: [phone]

## 2015-01-26 NOTE — Progress Notes (Signed)
CT surgery p.m. Rounds  Patient examined and labs reviewed Weaning FiO2 on ventilator but still with significant pulmonary edema and not ready to wean ventilator Remains sedated on full vent support Hemodynamics stable on inotropic Starting Lovenox for double mechanical valves

## 2015-01-27 ENCOUNTER — Inpatient Hospital Stay (HOSPITAL_COMMUNITY): Payer: Medicare HMO

## 2015-01-27 DIAGNOSIS — J80 Acute respiratory distress syndrome: Secondary | ICD-10-CM

## 2015-01-27 HISTORY — DX: Acute respiratory distress syndrome: J80

## 2015-01-27 LAB — GLUCOSE, CAPILLARY
GLUCOSE-CAPILLARY: 102 mg/dL — AB (ref 65–99)
GLUCOSE-CAPILLARY: 80 mg/dL (ref 65–99)
Glucose-Capillary: 108 mg/dL — ABNORMAL HIGH (ref 65–99)
Glucose-Capillary: 115 mg/dL — ABNORMAL HIGH (ref 65–99)
Glucose-Capillary: 120 mg/dL — ABNORMAL HIGH (ref 65–99)
Glucose-Capillary: 120 mg/dL — ABNORMAL HIGH (ref 65–99)
Glucose-Capillary: 129 mg/dL — ABNORMAL HIGH (ref 65–99)

## 2015-01-27 LAB — POCT I-STAT, CHEM 8
BUN: 20 mg/dL (ref 6–20)
CALCIUM ION: 1.22 mmol/L (ref 1.12–1.23)
CHLORIDE: 102 mmol/L (ref 101–111)
CREATININE: 0.9 mg/dL (ref 0.61–1.24)
GLUCOSE: 94 mg/dL (ref 65–99)
HEMATOCRIT: 34 % — AB (ref 39.0–52.0)
Hemoglobin: 11.6 g/dL — ABNORMAL LOW (ref 13.0–17.0)
POTASSIUM: 4.3 mmol/L (ref 3.5–5.1)
SODIUM: 133 mmol/L — AB (ref 135–145)
TCO2: 22 mmol/L (ref 0–100)

## 2015-01-27 LAB — BASIC METABOLIC PANEL
Anion gap: 4 — ABNORMAL LOW (ref 5–15)
BUN: 18 mg/dL (ref 6–20)
CO2: 25 mmol/L (ref 22–32)
Calcium: 7.4 mg/dL — ABNORMAL LOW (ref 8.9–10.3)
Chloride: 104 mmol/L (ref 101–111)
Creatinine, Ser: 0.89 mg/dL (ref 0.61–1.24)
GFR calc Af Amer: >60 mL/min (ref >60)
GFR calc non Af Amer: >60 mL/min (ref >60)
GLUCOSE: 108 mg/dL — AB (ref 65–99)
Potassium: 4.1 mmol/L (ref 3.5–5.1)
SODIUM: 133 mmol/L — AB (ref 135–145)

## 2015-01-27 LAB — CBC
HCT: 35 % — ABNORMAL LOW (ref 39.0–52.0)
Hemoglobin: 11.2 g/dL — ABNORMAL LOW (ref 13.0–17.0)
MCH: 26.3 pg (ref 26.0–34.0)
MCHC: 32 g/dL (ref 30.0–36.0)
MCV: 82.2 fL (ref 78.0–100.0)
Platelets: 79 10*3/uL — ABNORMAL LOW (ref 150–400)
RBC: 4.26 MIL/uL (ref 4.22–5.81)
RDW: 17 % — ABNORMAL HIGH (ref 11.5–15.5)
WBC: 13.8 10*3/uL — AB (ref 4.0–10.5)

## 2015-01-27 LAB — PROTIME-INR
INR: 2.06 — ABNORMAL HIGH (ref 0.00–1.49)
PROTHROMBIN TIME: 23.1 s — AB (ref 11.6–15.2)

## 2015-01-27 MED ORDER — OSMOLITE 1.2 CAL PO LIQD
1000.0000 mL | ORAL | Status: DC
  Filled 2015-01-27 (×2): qty 1000

## 2015-01-27 MED ORDER — FUROSEMIDE 10 MG/ML IJ SOLN
20.0000 mg | Freq: Four times a day (QID) | INTRAMUSCULAR | Status: DC
  Administered 2015-01-27 – 2015-01-28 (×4): 20 mg via INTRAVENOUS
  Filled 2015-01-27 (×8): qty 2

## 2015-01-27 MED ORDER — LEVALBUTEROL HCL 0.63 MG/3ML IN NEBU
0.6300 mg | INHALATION_SOLUTION | Freq: Four times a day (QID) | RESPIRATORY_TRACT | Status: DC
  Administered 2015-01-27 – 2015-02-06 (×38): 0.63 mg via RESPIRATORY_TRACT
  Filled 2015-01-27 (×68): qty 3

## 2015-01-27 MED ORDER — VITAL HIGH PROTEIN PO LIQD
1000.0000 mL | ORAL | Status: DC
Start: 2015-01-27 — End: 2015-02-02
  Administered 2015-01-27: 20:00:00
  Administered 2015-01-27: 1000 mL
  Administered 2015-01-28: 22:00:00
  Administered 2015-01-28 (×2): 1000 mL
  Administered 2015-01-28 – 2015-01-29 (×13)
  Administered 2015-01-29: 1000 mL
  Administered 2015-01-29 (×2)
  Administered 2015-01-30 – 2015-01-31 (×2): 1000 mL
  Administered 2015-02-01: 18:00:00
  Administered 2015-02-01 (×2): 1000 mL
  Administered 2015-02-01 – 2015-02-02 (×2)
  Filled 2015-01-27 (×13): qty 1000

## 2015-01-27 MED ORDER — FUROSEMIDE 10 MG/ML IJ SOLN: 20.0000 mg | Freq: Two times a day (BID) | INTRAMUSCULAR | Status: DC

## 2015-01-27 MED ORDER — WARFARIN SODIUM 4 MG PO TABS
4.0000 mg | ORAL_TABLET | Freq: Every day | ORAL | Status: DC
  Administered 2015-01-27: 4 mg
  Filled 2015-01-27 (×2): qty 1

## 2015-01-27 NOTE — Progress Notes (Signed)
PULMONARY / CRITICAL CARE MEDICINE   Name: Lance Cook MRN: [id number] DOB: 11-May-1961    ADMISSION DATE:  01/24/2015 CONSULTATION DATE:  01/27/2015  REFERRING MD :  Roxy Manns  CHIEF COMPLAINT:  Hypoxemia post op  INITIAL PRESENTATION: 54 y.o. M with extensive cardiac hx including severe MR.  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 Roxy Manns via rt thoracotomy.  Following procedure, remained hypoxemic and had edema vs ARDS on CXR.  PCCM consulted for recs. 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  Edema vs ARDS.  SUBJECTIVE: Stable on vent. Still on 68mcg levophed VITAL SIGNS: Temp:  [99.8 F (37.7 C)-101.7 F (38.7 C)] 100.2 F (37.9 C) (05/28 0900) Pulse Rate:  [90-127] 122 (05/28 0900) Resp:  [9-26] 23 (05/28 0900) BP: (81-118)/(38-59) 96/47 mmHg (05/28 0900) SpO2:  [96 %-100 %] 99 % (05/28 0900) Arterial Line BP: (82-136)/(47-64) 123/56 mmHg (05/28 0900) FiO2 (%):  [40 %] 40 % (05/28 0800) Weight:  [117.7 kg (259 lb 7.7 oz)] 117.7 kg (259 lb 7.7 oz) (05/28 0500) HEMODYNAMICS: PAP: (46-61)/(32-41) 53/35 mmHg CO:  [4.8 L/min-7.1 L/min] 7.1 L/min CI:  [2.3 L/min/m2-3.4 L/min/m2] 3.4 L/min/m2 VENTILATOR SETTINGS: Vent Mode:  [-] PRVC FiO2 (%):  [40 %] 40 % Set Rate:  [26 bmp] 26 bmp Vt Set:  [450 mL] 450 mL PEEP:  [5 cmH20] 5 cmH20 Plateau Pressure:  [19 cmH20-24 cmH20] 24 cmH20 INTAKE / OUTPUT: Intake/Output      05/27 0701 - 05/28 0700 05/28 0701 - 05/29 0700   I.V. (mL/kg) 3255 (27.7) 173.7 (1.5)   NG/GT 170    IV Piggyback 1000    Total Intake(mL/kg) 4425 (37.6) 173.7 (1.5)   Urine (mL/kg/hr) 1683 (0.6) 225 (0.7)   Emesis/NG output     Chest Tube 470 (0.2) 100 (0.3)   Total Output 2153 325   Net +2272 -151.3          PHYSICAL EXAMINATION: General: ill appearing Neuro: RASS -1 HEENT: ETT in  place Cardiovascular: regular Lungs: b/l crackles Rt > Lt, chest tubes in place Abdomen: soft, non tender Musculoskeletal: 1+ edema Skin: Intact, warm, no rashes  LABS:  CBC  Recent Labs Lab 01/25/15 1530 01/25/15 1540 01/26/15 0420 01/27/15 0430  WBC 21.5*  --  19.0* 13.8*  HGB 12.8* 13.9 11.9* 11.2*  HCT 40.4 41.0 36.5* 35.0*  PLT 140*  --  120* 79*   Coag's  Recent Labs Lab 01/22/15 1243 01/24/15 1850 01/27/15 0430  APTT 41* 41*  --   INR 1.36 1.68* 2.06*   BMET  Recent Labs Lab 01/25/15 0200  01/25/15 1540 01/26/15 0420 01/27/15 0430  NA 137  --  136 133* 133*  K 3.8  --  4.1 3.6 4.1  CL 106  --  101 102 104  CO2 22  --   --  23 25  BUN 16  --  22* 22* 18  CREATININE 0.95  < > 1.30* 1.45* 0.89  GLUCOSE 132*  --  137* 142* 108*  < > = values in this interval not displayed. Electrolytes  Recent Labs Lab 01/25/15 0200 01/25/15 1530 01/26/15 0420 01/27/15 0430  CALCIUM 7.2*  --  7.2* 7.4*  MG 2.5* 2.1  --   --    Sepsis Markers No results for input(s):  LATICACIDVEN, PROCALCITON, O2SATVEN in the last 168 hours. ABG  Recent Labs Lab 01/25/15 0457 01/25/15 1536 01/26/15 0425  PHART 7.262* 7.311* 7.327*  PCO2ART 52.2* 47.2* 49.1*  PO2ART 110.0* 93.0 144.0*   Liver Enzymes  Recent Labs Lab 01/22/15 1243 01/26/15 0420  AST 21 123*  ALT 17 40  ALKPHOS 47 41  BILITOT 1.0 1.2  ALBUMIN 2.9* 2.1*   Cardiac Enzymes No results for input(s): TROPONINI, PROBNP in the last 168 hours. Glucose  Recent Labs Lab 01/26/15 1128 01/26/15 1621 01/26/15 2031 01/27/15 0012 01/27/15 0442 01/27/15 0804  GLUCAP 129* 108* 119* 120* 108* 120*    Imaging Dg Chest Port 1 View  01/27/2015   CLINICAL DATA:  Respiratory failure  EXAM: PORTABLE CHEST - 1 VIEW  COMPARISON:  01/26/2015  FINDINGS: ET tube tip is stable above the carina. There is a Swan-Ganz catheter with tip in the projection of the main pulmonary artery. Nasogastric tube is in place.  Right chest tube is in place. There is a small right pneumothorax noted, ste similar to previous exam. Stable cardiac enlargement. Right lung opacities are unchanged from previous exam.  IMPRESSION: 1. No change in small right-sided pneumothorax. 2. Persistent right lung airspace opacities.   Electronically Signed   By: Kerby Moors M.D.   On: 01/27/2015 09:31     ASSESSMENT / PLAN:  PULMONARY OETT 5/25 >>> A: VDRF following MVR 5/25 ARDS vs unilateral re-expansion pulmonary edema (favor ALI rt lung) improved Fio2 0.4 peep 5 Hx OSA RT pneumothorax due to Rt thoracotomy R lung was collapsed/down but now expanding well 5/28 P:   Full mechanical support, not able to extubate ARDS protocol, cont peep 5/ fio2 0.40 VAP bundle. Xopenex on schedule   CARDIOVASCULAR R IJ Swan 5/25 >>> L radial A line 5/25 >>> A:  Cardiogenic shock Severe MR - s/p minimally invasive MVR 01/24/15 H/o CAD, HTN, HLD Chronic dCHF Hx PAF  P:  Management per TCTS Currently requiring NE,milrinone,  Neo weaning to off MAP goal > 65 Monitor hemodynamics, pad   RENAL A:   AKI  P:   Follow Bmet  GASTROINTESTINAL A:   GI prophylaxis Nutrition GERD P:   SUP:  IV Pantoprazole. NPO. Defer tube feeds to primary team  HEMATOLOGIC A:   VTE Prophylaxis  P:  SCD Hold chemoprophylaxis for now Follow CBC  INFECTIOUS A:   Surgical prophylaxis At risk PNA from atx  P:   Abx: Cefuroxime, start date 5/25 > Abx: Vancomycin, start date 5/25 > Follow WBC and fever curve  ENDOCRINE A:   DM  P:   SSI CBG monitoring  NEUROLOGIC A:   Acute metabolic encephalopathy  P:   Sedation:  Fentanyl, Versed RASS goal: -1 Daily WUA.  CC time 35 minutes.  Mariel Sleet Beeper  [phone]  Cell  [phone]  If no response or cell goes to voicemail, call beeper [place] 01/27/2015, 9:49 AM

## 2015-01-27 NOTE — Progress Notes (Signed)
Pt A-line was bleeding heavily. Dressing changed and area cleaned and re-secured. RT will continue to monitor.

## 2015-01-27 NOTE — Progress Notes (Signed)
Nutrition Follow-up  DOCUMENTATION CODES:  Morbid obesity  INTERVENTION:  Tube feeding (Initate Vital High Protein via OGT at 25 ml/hr and increase by 10 ml every 4 hours to goal rate of 65 ml/hr)   Tube feeding regimen provides 1560 kcal (100% of needs), 137 grams of protein, and 1310 ml of H2O.   RD to continue to monitor.   NUTRITION DIAGNOSIS:  Inadequate oral intake related to inability to eat as evidenced by NPO status; ongoing  GOAL:  Provide needs based on ASPEN/SCCM guidelines; progressing  MONITOR:  TF tolerance, Weight trends, Labs, I & O's, Vent status  REASON FOR ASSESSMENT:  Ventilator, Low Braden    ASSESSMENT: [name] with with extensive cardiac hx including severe MR; following surgical procedure, pt remained hypoxemic and had edema vs ARDS on CXR.  Patient s/p procedure 5/25: MITRAL VALVE REPLACEMENT (RIGHT)  Patient is currently intubated on ventilator support MV: 12 L/min Temp (24hrs), Avg:100.3 F (37.9 C), Min:99.8 F (37.7 C), Max:100.8 F (38.2 C)  Pt with significant pulmonary edema and not ready to wean ventilator and remains on full support. RD given consent per MD to manage tube feeds. RD to order.   Labs and medications reviewed.   Height:  Ht Readings from Last 1 Encounters:  01/24/15 5\' 1"  (1.549 m)    Weight:  Wt Readings from Last 1 Encounters:  01/27/15 259 lb 7.7 oz (117.7 kg)    Ideal Body Weight:  51 kg  Wt Readings from Last 10 Encounters:  01/27/15 259 lb 7.7 oz (117.7 kg)  01/22/15 244 lb 12.8 oz (111.041 kg)  01/16/15 237 lb (107.502 kg)  12/25/14 237 lb (107.502 kg)  12/18/14 240 lb (108.863 kg)  11/13/14 244 lb (110.678 kg)  10/06/14 242 lb 14.4 oz (110.179 kg)  05/23/14 230 lb (104.327 kg)  04/14/14 231 lb (104.781 kg)  04/02/14 233 lb 14.5 oz (106.1 kg)    BMI:  Body mass index is 49.05 kg/(m^2).  Estimated Nutritional Needs:  Kcal:  1157-2620  Protein:  130-140 grams  Fluid:  Per  MD  Skin:   (Incision on R groin, R chest, back mid skin tears, +3 generalized edema)  Diet Order:  Diet NPO time specified  EDUCATION NEEDS:  No education needs identified at this time   Intake/Output Summary (Last 24 hours) at 01/27/15 1318 Last data filed at 01/27/15 1100  Gross per 24 hour  Intake 3094.98 ml  Output   2079 ml  Net 1015.98 ml    Last BM:  PTA  Kallie Locks, MS, RD, LDN Pager # [phone]  After hours/ weekend pager # [phone]

## 2015-01-27 NOTE — Progress Notes (Signed)
CT surgery p.m. Rounds  Patient examined and record reviewed.Hemodynamics stable,labs satisfactory.Patient had stable day.Continue current care. Hope to wean ventilator soon. Lance Cook 01/27/2015

## 2015-01-28 ENCOUNTER — Inpatient Hospital Stay (HOSPITAL_COMMUNITY): Payer: Medicare HMO

## 2015-01-28 DIAGNOSIS — J9600 Acute respiratory failure, unspecified whether with hypoxia or hypercapnia: Secondary | ICD-10-CM | POA: Diagnosis not present

## 2015-01-28 DIAGNOSIS — J9601 Acute respiratory failure with hypoxia: Secondary | ICD-10-CM | POA: Diagnosis not present

## 2015-01-28 LAB — CBC
HCT: 32.9 % — ABNORMAL LOW (ref 39.0–52.0)
Hemoglobin: 10.4 g/dL — ABNORMAL LOW (ref 13.0–17.0)
MCH: 25.9 pg — AB (ref 26.0–34.0)
MCHC: 31.6 g/dL (ref 30.0–36.0)
MCV: 81.8 fL (ref 78.0–100.0)
Platelets: 68 10*3/uL — ABNORMAL LOW (ref 150–400)
RBC: 4.02 MIL/uL — ABNORMAL LOW (ref 4.22–5.81)
RDW: 17.1 % — AB (ref 11.5–15.5)
WBC: 9.2 10*3/uL (ref 4.0–10.5)

## 2015-01-28 LAB — GLUCOSE, CAPILLARY
GLUCOSE-CAPILLARY: 124 mg/dL — AB (ref 65–99)
Glucose-Capillary: 106 mg/dL — ABNORMAL HIGH (ref 65–99)
Glucose-Capillary: 118 mg/dL — ABNORMAL HIGH (ref 65–99)
Glucose-Capillary: 122 mg/dL — ABNORMAL HIGH (ref 65–99)
Glucose-Capillary: 150 mg/dL — ABNORMAL HIGH (ref 65–99)
Glucose-Capillary: 150 mg/dL — ABNORMAL HIGH (ref 65–99)

## 2015-01-28 LAB — BASIC METABOLIC PANEL
Anion gap: 4 — ABNORMAL LOW (ref 5–15)
Anion gap: 4 — ABNORMAL LOW (ref 5–15)
BUN: 23 mg/dL — ABNORMAL HIGH (ref 6–20)
BUN: 28 mg/dL — ABNORMAL HIGH (ref 6–20)
CO2: 25 mmol/L (ref 22–32)
CO2: 26 mmol/L (ref 22–32)
Calcium: 7.4 mg/dL — ABNORMAL LOW (ref 8.9–10.3)
Calcium: 7.1 mg/dL — ABNORMAL LOW (ref 8.9–10.3)
Chloride: 105 mmol/L (ref 101–111)
Chloride: 104 mmol/L (ref 101–111)
Creatinine, Ser: 0.98 mg/dL (ref 0.61–1.24)
Creatinine, Ser: 0.95 mg/dL (ref 0.61–1.24)
GFR calc Af Amer: >60 mL/min (ref >60)
GFR calc Af Amer: >60 mL/min (ref >60)
GFR calc non Af Amer: >60 mL/min (ref >60)
GFR calc non Af Amer: >60 mL/min (ref >60)
GLUCOSE: 125 mg/dL — AB (ref 65–99)
Glucose, Bld: 151 mg/dL — ABNORMAL HIGH (ref 65–99)
Potassium: 4 mmol/L (ref 3.5–5.1)
Potassium: 3.9 mmol/L (ref 3.5–5.1)
SODIUM: 134 mmol/L — AB (ref 135–145)
Sodium: 134 mmol/L — ABNORMAL LOW (ref 135–145)

## 2015-01-28 LAB — PROTIME-INR
INR: 2.74 — ABNORMAL HIGH (ref 0.00–1.49)
Prothrombin Time: 28.6 seconds — ABNORMAL HIGH (ref 11.6–15.2)

## 2015-01-28 LAB — TYPE AND SCREEN
ABO/RH(D): B POS
ANTIBODY SCREEN: NEGATIVE
UNIT DIVISION: 0
UNIT DIVISION: 0
UNIT DIVISION: 0
Unit division: 0

## 2015-01-28 MED ORDER — FUROSEMIDE 10 MG/ML IJ SOLN
8.0000 mg/h | INTRAVENOUS | Status: DC
  Administered 2015-01-28: 8 mg/h via INTRAVENOUS
  Filled 2015-01-28: qty 25

## 2015-01-28 MED ORDER — SODIUM CHLORIDE 0.9 % IV SOLN
25.0000 ug/h | INTRAVENOUS | Status: DC
  Administered 2015-01-29: 50 ug/h via INTRAVENOUS
  Administered 2015-01-30: 25 ug/h via INTRAVENOUS
  Administered 2015-01-31 – 2015-02-01 (×2): 50 ug/h via INTRAVENOUS
  Filled 2015-01-28 (×6): qty 50

## 2015-01-28 MED ORDER — DOCUSATE SODIUM 50 MG/5ML PO LIQD
100.0000 mg | Freq: Two times a day (BID) | ORAL | Status: DC | PRN
  Filled 2015-01-28: qty 10

## 2015-01-28 MED ORDER — FENTANYL BOLUS VIA INFUSION
50.0000 ug | INTRAVENOUS | Status: DC | PRN
  Filled 2015-01-28: qty 50

## 2015-01-28 MED ORDER — MIDAZOLAM HCL 2 MG/2ML IJ SOLN
2.0000 mg | INTRAMUSCULAR | Status: DC | PRN
  Administered 2015-01-30: 2 mg via INTRAVENOUS
  Filled 2015-01-28 (×2): qty 2

## 2015-01-28 MED ORDER — FUROSEMIDE 10 MG/ML IJ SOLN
40.0000 mg | Freq: Once | INTRAMUSCULAR | Status: AC
  Administered 2015-01-28: 40 mg via INTRAVENOUS

## 2015-01-28 MED ORDER — FENTANYL CITRATE (PF) 100 MCG/2ML IJ SOLN: 50.0000 ug | Freq: Once | INTRAMUSCULAR | Status: AC

## 2015-01-28 MED ORDER — MIDAZOLAM HCL 2 MG/2ML IJ SOLN
2.0000 mg | INTRAMUSCULAR | Status: DC | PRN
  Administered 2015-01-29 – 2015-02-01 (×12): 2 mg via INTRAVENOUS
  Filled 2015-01-28 (×13): qty 2

## 2015-01-28 MED ORDER — WARFARIN SODIUM 2 MG PO TABS
2.0000 mg | ORAL_TABLET | Freq: Once | ORAL | Status: AC
  Administered 2015-01-28: 2 mg via ORAL
  Filled 2015-01-28: qty 1

## 2015-01-28 MED ORDER — POTASSIUM CHLORIDE 10 MEQ/50ML IV SOLN
10.0000 meq | INTRAVENOUS | Status: AC
  Administered 2015-01-28 (×2): 10 meq via INTRAVENOUS
  Filled 2015-01-28: qty 50

## 2015-01-28 MED ORDER — FUROSEMIDE 10 MG/ML IJ SOLN
10.0000 mg/h | INTRAVENOUS | Status: DC
  Administered 2015-01-29 – 2015-02-03 (×7): 10 mg/h via INTRAVENOUS
  Filled 2015-01-28 (×13): qty 25

## 2015-01-28 MED ORDER — PANTOPRAZOLE SODIUM 40 MG PO PACK
40.0000 mg | PACK | Freq: Every day | ORAL | Status: DC
  Administered 2015-01-28 – 2015-02-01 (×5): 40 mg
  Filled 2015-01-28 (×6): qty 20

## 2015-01-28 MED ORDER — SODIUM CHLORIDE 0.9 % IV SOLN
INTRAVENOUS | Status: DC
  Administered 2015-01-29: 16:00:00 via INTRAVENOUS
  Administered 2015-01-31: 10 mL/h via INTRAVENOUS

## 2015-01-28 NOTE — Progress Notes (Signed)
CT surgery p.m. Rounds  Patient hemodynamics stable with rate controlled atrial fibrillation. Norepinephrine weaned to 4 mcg Lasix drip started with improved urine output but will increase to 10 mg per hour and give 1 dose 40 mg IV Tube feeds at goal Potassium supplemented this p.m.

## 2015-01-28 NOTE — Progress Notes (Signed)
PULMONARY / CRITICAL CARE MEDICINE   Name: Lance Cook MRN: [id number] DOB: 09/04/1960    ADMISSION DATE:  01/24/2015 CONSULTATION DATE:  01/28/2015  REFERRING MD :  Roxy Manns  CHIEF COMPLAINT:  Hypoxemia post op  INITIAL PRESENTATION: 54 y.o. M with extensive cardiac hx including severe MR.  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 Roxy Manns via rt thoracotomy.  Following procedure, remained hypoxemic and had edema vs ARDS on CXR.  PCCM consulted for recs. 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  Edema vs ARDS.  SUBJECTIVE: Pt sedated on vent. Weaning pressors.   VITAL SIGNS: Temp:  [99.7 F (37.6 C)-100.6 F (38.1 C)] 99.7 F (37.6 C) (05/29 0600) Pulse Rate:  [90-122] 113 (05/29 0600) Resp:  [0-27] 21 (05/29 0600) BP: (81-118)/(44-63) 108/57 mmHg (05/29 0600) SpO2:  [97 %-100 %] 98 % (05/29 0600) Arterial Line BP: (97-138)/(46-72) 135/63 mmHg (05/29 0600) FiO2 (%):  [40 %] 40 % (05/29 0516) Weight:  [118.1 kg (260 lb 5.8 oz)] 118.1 kg (260 lb 5.8 oz) (05/29 0500) HEMODYNAMICS: PAP: (38-59)/(29-45) 58/38 mmHg CO:  [4.3 L/min-4.7 L/min] 4.7 L/min CI:  [2.1 L/min/m2-2.3 L/min/m2] 2.3 L/min/m2 VENTILATOR SETTINGS: Vent Mode:  [-] PRVC FiO2 (%):  [40 %] 40 % Set Rate:  [26 bmp] 26 bmp Vt Set:  [450 mL] 450 mL PEEP:  [5 cmH20] 5 cmH20 Plateau Pressure:  [22 cmH20-26 cmH20] 26 cmH20 INTAKE / OUTPUT: Intake/Output      05/28 0701 - 05/29 0700 05/29 0701 - 05/30 0700   I.V. (mL/kg) 1392.5 (11.8)    NG/GT 516.7    IV Piggyback 450    Total Intake(mL/kg) 2359.2 (20)    Urine (mL/kg/hr) 1896 (0.7)    Chest Tube 530 (0.2)    Total Output 2426     Net -66.8            PHYSICAL EXAMINATION: General: ill appearing Neuro: RASS -1 on wake up assessment HEENT: ETT in place Cardiovascular: regular Lungs: b/l  crackles Rt > Lt, chest tubes in place Abdomen: soft, non tender Musculoskeletal: 1+ edema Skin: Intact, warm, no rashes  LABS:  CBC  Recent Labs Lab 01/26/15 0420 01/27/15 0430 01/27/15 1620 01/28/15 0400  WBC 19.0* 13.8*  --  9.2  HGB 11.9* 11.2* 11.6* 10.4*  HCT 36.5* 35.0* 34.0* 32.9*  PLT 120* 79*  --  68*   Coag's  Recent Labs Lab 01/22/15 1243 01/24/15 1850 01/27/15 0430 01/28/15 0400  APTT 41* 41*  --   --   INR 1.36 1.68* 2.06* 2.74*   BMET  Recent Labs Lab 01/26/15 0420 01/27/15 0430 01/27/15 1620 01/28/15 0400  NA 133* 133* 133* 134*  K 3.6 4.1 4.3 4.0  CL 102 104 102 105  CO2 23 25  --  25  BUN 22* 18 20 23*  CREATININE 1.45* 0.89 0.90 0.98  GLUCOSE 142* 108* 94 125*   Electrolytes  Recent Labs Lab 01/25/15 0200 01/25/15 1530 01/26/15 0420 01/27/15 0430 01/28/15 0400  CALCIUM 7.2*  --  7.2* 7.4* 7.4*  MG 2.5* 2.1  --   --   --    Sepsis Markers No results for input(s): LATICACIDVEN, PROCALCITON, O2SATVEN in the last 168 hours. ABG  Recent Labs Lab 01/25/15 0457 01/25/15 1536 01/26/15 0425  PHART  7.262* 7.311* 7.327*  PCO2ART 52.2* 47.2* 49.1*  PO2ART 110.0* 93.0 144.0*   Liver Enzymes  Recent Labs Lab 01/22/15 1243 01/26/15 0420  AST 21 123*  ALT 17 40  ALKPHOS 47 41  BILITOT 1.0 1.2  ALBUMIN 2.9* 2.1*   Cardiac Enzymes No results for input(s): TROPONINI, PROBNP in the last 168 hours. Glucose  Recent Labs Lab 01/27/15 0804 01/27/15 1154 01/27/15 1543 01/27/15 1935 01/27/15 2356 01/28/15 0402  GLUCAP 120* 102* 80 115* 106* 118*    Imaging Dg Chest Port 1 View  01/28/2015   CLINICAL DATA:  Followup for acute respiratory failure. Right-sided chest tube.  EXAM: PORTABLE CHEST - 1 VIEW  COMPARISON:  01/27/2015.  FINDINGS: Probable minimal right pneumothorax persists evident at the right lateral lung base.  There is persistent diffuse heterogeneous airspace opacities throughout the right lung. Right-sided chest  tubes are stable.  Mild opacity at the left lung base is stable, likely atelectasis.  Changes from cardiac surgery and valve replacement are stable. Cardiac silhouette is mildly enlarged. No mediastinal widening.  Endotracheal tube, nasogastric tube, and left internal jugular Swan-Ganz catheter are stable.  IMPRESSION: 1. Probable minimal residual pneumothorax on the right, not well-defined. 2. Persistent airspace opacity throughout the right lung. Persistent mild left lung base opacity, which is likely atelectasis. No new lung opacities. 3. Support apparatus is stable and well positioned.   Electronically Signed   By: Lajean Manes M.D.   On: 01/28/2015 08:17     ASSESSMENT / PLAN:  PULMONARY OETT 5/25 >>> A: VDRF following MVR 5/25 ARDS vs unilateral re-expansion pulmonary edema (favor ALI rt lung) improved Fio2 0.4 peep 5 Hx OSA RT pneumothorax due to Rt thoracotomy R lung was collapsed/down but now expanding well 5/29 P:   Full mechanical support, not able to extubate 5/29 ARDS protocol, cont peep 5/ fio2 0.40 VAP bundle. Xopenex on schedule   CARDIOVASCULAR R IJ Swan 5/25 >>> L radial A line 5/25 >>> A:  Cardiogenic shock Severe MR - s/p minimally invasive MVR 01/24/15 H/o CAD, HTN, HLD Chronic dCHF Hx PAF  P:  Management per TCTS Currently on  milrinone, Neo off. Levophed down to 32mcg MAP goal > 65 Need to get PA catheter out , will ask TCTS if this is ok to do so, place CVL   RENAL A:   AKI resolved , Cr normal I/O even 5/28  P:   Follow Bmet Needs diuresis when able  GASTROINTESTINAL A:   GI prophylaxis Nutrition GERD P:   SUP: Per tube  Pantoprazole. NPO. Now on  tube feeds to primary team  HEMATOLOGIC A:   VTE Prophylaxis  P:  On lovenox dvt proph Follow CBC  INFECTIOUS A:   Surgical prophylaxis At risk PNA from atx  Resp c/s 5/28>>> P:   Off cefuroxime 5/27 Abx: aztreonam start date 5/27 > Abx: Vancomycin, start date 5/25 > Improving  WBC and fever curve  ENDOCRINE A:   DM  P:   SSI CBG monitoring  NEUROLOGIC A:   Acute metabolic encephalopathy  P:   Sedation:  Fentanyl, try to get off versed drip RASS goal: -1 Daily WUA.  CC time 35 minutes.  Mariel Sleet Beeper  [phone]  Cell  [phone]  If no response or cell goes to voicemail, call beeper [place] 01/28/2015, 8:54 AM

## 2015-01-28 NOTE — Procedures (Signed)
Central Venous Catheter Insertion Procedure Note Ezequiel Macauley [id number] [birth year]  Procedure: Insertion of Central Venous Catheter Indications: Assessment of intravascular volume and Drug and/or fluid administration  Procedure Details Consent: Risks of procedure as well as the alternatives and risks of each were explained to the (patient/caregiver).  Consent for procedure obtained. Time Out: Verified patient identification, verified procedure, site/side was marked, verified correct patient position, special equipment/implants available, medications/allergies/relevent history reviewed, required imaging and test results available.  Performed  Maximum sterile technique was used including antiseptics, cap, gloves, gown, hand hygiene, mask and sheet. Skin prep: Chlorhexidine; local anesthetic administered A antimicrobial bonded/coated triple lumen catheter was placed in the left internal jugular vein using the Seldinger technique.  Evaluation Blood flow good Complications: No apparent complications Patient did tolerate procedure well. Chest X-ray ordered to verify placement.  CXR: pending.  YACOUB,WESAM 01/28/2015, 10:29 AM

## 2015-01-28 NOTE — Progress Notes (Signed)
4 Days Post-Op Procedure(s) (LRB): TRANSESOPHAGEAL ECHOCARDIOGRAM (TEE) (N/A) Re-Operation, MINIMALLY INVASIVE MITRAL VALVE (MV) REPLACEMENT. (Right) Subjective: Patient remains intubated for postop pulmonary edema-acute lung injury Chest x-ray does show some improvement in oxygenation is better Patient needs more aggressive diuresis now-Lasix drip started Patient's platelet count has diminished down to 65,000, INR however is therapeutic and Lovenox has been stopped. HRT screen pending. Patient receiving nutrition via enteral feedings Ventilator wean per critical care medicine Pulmonary artery catheter removed today-will check daily mixed venous saturation  Objective: Vital signs in last 24 hours: Temp:  [99.7 F (37.6 C)-100.6 F (38.1 C)] 99.9 F (37.7 C) (05/29 1000) Pulse Rate:  [90-116] 93 (05/29 1200) Cardiac Rhythm:  [-] Atrial fibrillation (05/29 1200) Resp:  [0-26] 26 (05/29 1200) BP: (83-118)/(41-60) 92/54 mmHg (05/29 1200) SpO2:  [97 %-100 %] 100 % (05/29 1200) Arterial Line BP: (97-138)/(46-72) 111/49 mmHg (05/29 1200) FiO2 (%):  [40 %] 40 % (05/29 1200) Weight:  [260 lb 5.8 oz (118.1 kg)] 260 lb 5.8 oz (118.1 kg) (05/29 0500)  Hemodynamic parameters for last 24 hours: PAP: (38-59)/(23-45) 44/30 mmHg CO:  [4.3 L/min-5.6 L/min] 5.6 L/min CI:  [2.1 L/min/m2-2.7 L/min/m2] 2.7 L/min/m2  Intake/Output from previous day: 05/28 0701 - 05/29 0700 In: 2472.5 [I.V.:1450.8; NG/GT:571.7; IV Piggyback:450] Out: 2426 [Urine:1896; Chest Tube:530] Intake/Output this shift: Total I/O In: 663.8 [I.V.:218.8; NG/GT:445] Out: 280 [Urine:200; Chest Tube:80]  Exam Patient opens eyes and is more alert Significant peripheral edema Chest tube drainage remains significant without airleak Sharp valve closure sounds noted Abdomen nontender  Lab Results:  Recent Labs  01/27/15 0430 01/27/15 1620 01/28/15 0400  WBC 13.8*  --  9.2  HGB 11.2* 11.6* 10.4*  HCT 35.0* 34.0* 32.9*   PLT 79*  --  68*   BMET:  Recent Labs  01/27/15 0430 01/27/15 1620 01/28/15 0400  NA 133* 133* 134*  K 4.1 4.3 4.0  CL 104 102 105  CO2 25  --  25  GLUCOSE 108* 94 125*  BUN 18 20 23*  CREATININE 0.89 0.90 0.98  CALCIUM 7.4*  --  7.4*    PT/INR:  Recent Labs  01/28/15 0400  LABPROT 28.6*  INR 2.74*   ABG    Component Value Date/Time   PHART 7.327* 01/26/2015 0425   HCO3 25.4* 01/26/2015 0425   TCO2 22 01/27/2015 1620   ACIDBASEDEF 1.0 01/26/2015 0425   O2SAT 99.0 01/26/2015 0425   CBG (last 3)   Recent Labs  01/27/15 2356 01/28/15 0402 01/28/15 0830  GLUCAP 106* 118* 150*    Assessment/Plan: S/P Procedure(s) (LRB): TRANSESOPHAGEAL ECHOCARDIOGRAM (TEE) (N/A) Re-Operation, MINIMALLY INVASIVE MITRAL VALVE (MV) REPLACEMENT. (Right)  Continue daily Coumadin but reduced dose because of rising INR IV Lasix diuresis-replete potassium Slowly advance tube feeds as tolerated Inotropic support currently low levels-continue until extubated Follow-up tracheal aspirate cultures-continue broad-spectrum anabiotic's  LOS: 4 days    Tharon Aquas Trigt III 01/28/2015

## 2015-01-29 ENCOUNTER — Inpatient Hospital Stay (HOSPITAL_COMMUNITY): Payer: Medicare HMO

## 2015-01-29 DIAGNOSIS — J189 Pneumonia, unspecified organism: Secondary | ICD-10-CM | POA: Diagnosis not present

## 2015-01-29 LAB — BASIC METABOLIC PANEL
Anion gap: 5 (ref 5–15)
BUN: 29 mg/dL — ABNORMAL HIGH (ref 6–20)
CO2: 27 mmol/L (ref 22–32)
CREATININE: 1.02 mg/dL (ref 0.61–1.24)
Calcium: 7.3 mg/dL — ABNORMAL LOW (ref 8.9–10.3)
Chloride: 105 mmol/L (ref 101–111)
GFR calc Af Amer: >60 mL/min (ref >60)
GFR calc non Af Amer: >60 mL/min (ref >60)
GLUCOSE: 117 mg/dL — AB (ref 65–99)
Potassium: 3.7 mmol/L (ref 3.5–5.1)
SODIUM: 137 mmol/L (ref 135–145)

## 2015-01-29 LAB — CBC
HEMATOCRIT: 33.4 % — AB (ref 39.0–52.0)
Hemoglobin: 10.5 g/dL — ABNORMAL LOW (ref 13.0–17.0)
MCH: 25.8 pg — ABNORMAL LOW (ref 26.0–34.0)
MCHC: 31.4 g/dL (ref 30.0–36.0)
MCV: 82.1 fL (ref 78.0–100.0)
PLATELETS: 74 10*3/uL — AB (ref 150–400)
RBC: 4.07 MIL/uL — ABNORMAL LOW (ref 4.22–5.81)
RDW: 17.2 % — AB (ref 11.5–15.5)
WBC: 6.3 10*3/uL (ref 4.0–10.5)

## 2015-01-29 LAB — GLUCOSE, CAPILLARY
GLUCOSE-CAPILLARY: 110 mg/dL — AB (ref 65–99)
GLUCOSE-CAPILLARY: 112 mg/dL — AB (ref 65–99)
GLUCOSE-CAPILLARY: 114 mg/dL — AB (ref 65–99)
GLUCOSE-CAPILLARY: 114 mg/dL — AB (ref 65–99)
GLUCOSE-CAPILLARY: 123 mg/dL — AB (ref 65–99)
Glucose-Capillary: 144 mg/dL — ABNORMAL HIGH (ref 65–99)

## 2015-01-29 LAB — VANCOMYCIN, TROUGH: Vancomycin Tr: 15 ug/mL (ref 10.0–20.0)

## 2015-01-29 LAB — PROTIME-INR
INR: 1.9 — AB (ref 0.00–1.49)
Prothrombin Time: 21.7 seconds — ABNORMAL HIGH (ref 11.6–15.2)

## 2015-01-29 LAB — POCT I-STAT 4, (NA,K, GLUC, HGB,HCT)
Glucose, Bld: 142 mg/dL — ABNORMAL HIGH (ref 65–99)
HEMATOCRIT: 31 % — AB (ref 39.0–52.0)
Hemoglobin: 10.5 g/dL — ABNORMAL LOW (ref 13.0–17.0)
Potassium: 3.7 mmol/L (ref 3.5–5.1)
Sodium: 139 mmol/L (ref 135–145)

## 2015-01-29 LAB — CARBOXYHEMOGLOBIN
Carboxyhemoglobin: 1.6 % — ABNORMAL HIGH (ref 0.5–1.5)
Methemoglobin: 1.1 % (ref 0.0–1.5)
O2 Saturation: 69.7 %
Total hemoglobin: 18.2 g/dL — ABNORMAL HIGH (ref 13.5–18.0)

## 2015-01-29 MED ORDER — INSULIN ASPART 100 UNIT/ML ~~LOC~~ SOLN: 0.0000 [IU] | SUBCUTANEOUS | Status: DC

## 2015-01-29 MED ORDER — WARFARIN SODIUM 4 MG PO TABS
4.0000 mg | ORAL_TABLET | Freq: Once | ORAL | Status: AC
  Administered 2015-01-29: 4 mg via ORAL
  Filled 2015-01-29: qty 1

## 2015-01-29 MED ORDER — POTASSIUM CHLORIDE 10 MEQ/50ML IV SOLN
INTRAVENOUS | Status: AC
  Filled 2015-01-29: qty 50

## 2015-01-29 MED ORDER — POTASSIUM CHLORIDE 10 MEQ/50ML IV SOLN
10.0000 meq | INTRAVENOUS | Status: AC | PRN
  Administered 2015-01-29 (×3): 10 meq via INTRAVENOUS
  Filled 2015-01-29 (×2): qty 50

## 2015-01-29 MED ORDER — VANCOMYCIN HCL IN DEXTROSE 1-5 GM/200ML-% IV SOLN
1000.0000 mg | Freq: Two times a day (BID) | INTRAVENOUS | Status: DC
  Administered 2015-01-30 – 2015-01-31 (×4): 1000 mg via INTRAVENOUS
  Filled 2015-01-29 (×4): qty 200

## 2015-01-29 MED ORDER — POTASSIUM CHLORIDE 10 MEQ/50ML IV SOLN
10.0000 meq | INTRAVENOUS | Status: AC
  Administered 2015-01-29 (×2): 10 meq via INTRAVENOUS
  Filled 2015-01-29 (×2): qty 50

## 2015-01-29 MED ORDER — POTASSIUM CHLORIDE 10 MEQ/50ML IV SOLN
10.0000 meq | INTRAVENOUS | Status: AC
  Administered 2015-01-29 (×3): 10 meq via INTRAVENOUS

## 2015-01-29 NOTE — Progress Notes (Signed)
PULMONARY / CRITICAL CARE MEDICINE   Name: Lance Cook MRN: [id number] DOB: [birth year]    ADMISSION DATE:  01/24/2015 CONSULTATION DATE:  01/29/2015  REFERRING MD :  Roxy Manns  CHIEF COMPLAINT:  Hypoxemia post op  INITIAL PRESENTATION: 54 y.o. M with extensive cardiac hx including severe MR.  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 Roxy Manns via rt thoracotomy.  Following procedure, remained hypoxemic and had edema vs ARDS on CXR.  PCCM consulted for recs. Long pump time prior Bentall aortic root replacement in 1999 as well as CABG x 1   STUDIES:    SIGNIFICANT EVENTS:   SUBJECTIVE: Now more alert, weaning on psv  VITAL SIGNS: Temp:  [98.5 F (36.9 C)-100.9 F (38.3 C)] 100.4 F (38 C) (05/30 0700) Pulse Rate:  [92-113] 101 (05/30 0941) Resp:  [0-28] 28 (05/30 0941) BP: (87-137)/(37-70) 137/59 mmHg (05/30 0941) SpO2:  [98 %-100 %] 99 % (05/30 0700) Arterial Line BP: (95-152)/(46-70) 123/64 mmHg (05/30 0700) FiO2 (%):  [40 %] 40 % (05/30 0941) Weight:  [116.6 kg (257 lb 0.9 oz)] 116.6 kg (257 lb 0.9 oz) (05/30 0300) HEMODYNAMICS: PAP: (44)/(30) 44/30 mmHg CVP:  [7 mmHg-24 mmHg] 13 mmHg VENTILATOR SETTINGS: Vent Mode:  [-] PSV;CPAP FiO2 (%):  [40 %] 40 % Set Rate:  [26 bmp] 26 bmp Vt Set:  [450 mL] 450 mL PEEP:  [5 cmH20] 5 cmH20 Pressure Support:  [12 cmH20] 12 cmH20 Plateau Pressure:  [18 cmH20-25 cmH20] 18 cmH20 INTAKE / OUTPUT: Intake/Output      05/29 0701 - 05/30 0700 05/30 0701 - 05/31 0700   I.V. (mL/kg) 1287.8 (11)    NG/GT 2000    IV Piggyback 550    Total Intake(mL/kg) 3837.8 (32.9)    Urine (mL/kg/hr) 3200 (1.1) 250 (0.7)   Stool 0 (0)    Chest Tube 510 (0.2) 90 (0.3)   Total Output 3710 340   Net +127.8 -340        Stool Occurrence 2 x      PHYSICAL EXAMINATION: General: ill appearing Neuro: RASS -1 on wake up assessment HEENT: ETT in place Cardiovascular: regular Lungs: b/l crackles Rt > Lt, chest tubes in place Abdomen: soft, non  tender Musculoskeletal: 1+ edema Skin: Intact, warm, no rashes  LABS:  CBC  Recent Labs Lab 01/27/15 0430 01/27/15 1620 01/28/15 0400 01/29/15 0350  WBC 13.8*  --  9.2 6.3  HGB 11.2* 11.6* 10.4* 10.5*  HCT 35.0* 34.0* 32.9* 33.4*  PLT 79*  --  68* 74*   Coag's  Recent Labs Lab 01/22/15 1243 01/24/15 1850 01/27/15 0430 01/28/15 0400 01/29/15 0350  APTT 41* 41*  --   --   --   INR 1.36 1.68* 2.06* 2.74* 1.90*   BMET  Recent Labs Lab 01/28/15 0400 01/28/15 1529 01/29/15 0350  NA 134* 134* 137  K 4.0 3.9 3.7  CL 105 104 105  CO2 25 26 27   BUN 23* 28* 29*  CREATININE 0.98 0.95 1.02  GLUCOSE 125* 151* 117*   Electrolytes  Recent Labs Lab 01/25/15 0200 01/25/15 1530  01/28/15 0400 01/28/15 1529 01/29/15 0350  CALCIUM 7.2*  --   < > 7.4* 7.1* 7.3*  MG 2.5* 2.1  --   --   --   --   < > = values in this interval not displayed. Sepsis Markers No results for input(s): LATICACIDVEN, PROCALCITON, O2SATVEN in the last 168 hours. ABG  Recent Labs Lab 01/25/15 0457 01/25/15 1536 01/26/15 0425  PHART 7.262*  7.311* 7.327*  PCO2ART 52.2* 47.2* 49.1*  PO2ART 110.0* 93.0 144.0*   Liver Enzymes  Recent Labs Lab 01/22/15 1243 01/26/15 0420  AST 21 123*  ALT 17 40  ALKPHOS 47 41  BILITOT 1.0 1.2  ALBUMIN 2.9* 2.1*   Cardiac Enzymes No results for input(s): TROPONINI, PROBNP in the last 168 hours. Glucose  Recent Labs Lab 01/28/15 1213 01/28/15 1541 01/28/15 2008 01/28/15 2340 01/29/15 0435 01/29/15 0804  GLUCAP 124* 150* 122* 110* 114* 144*    Imaging Dg Chest Port 1 View  01/29/2015   CLINICAL DATA:  Acute respiratory failure  EXAM: PORTABLE CHEST - 1 VIEW  COMPARISON:  01/28/2015  FINDINGS: The endotracheal tube and nasogastric catheter are again identified. Right-sided chest tubes are again seen and stable. No pneumothorax is noted. Two densities are noted adjacent to the aortic knob which are different than that seen on the recent exam  and likely related to extrinsic artifact. Cardiac shadow remains enlarged. Postsurgical changes are again seen. Patchy infiltrates are noted throughout both lungs and stable. No definitive pneumothorax is seen. Persistent subcutaneous air on the right is noted. Left internal jugular catheter stable in appearance.  IMPRESSION: Tubes and lines as described above.  When compare with the prior exam no significant interval change is noted.   Electronically Signed   By: Inez Catalina M.D.   On: 01/29/2015 07:46   Dg Chest Port 1 View  01/28/2015   CLINICAL DATA:  Central line placement  EXAM: PORTABLE CHEST - 1 VIEW  COMPARISON:  01/28/2015 at 0602 hours  FINDINGS: Multiple line/tubes overlie the patient, which obscures evaluation.  Pulmonary vascular congestion and possible mild interstitial edema. Probable small left pleural effusion. Superimposed patchy airspace opacities in the lungs bilaterally, suspicious for multifocal pneumonia.  Suspected small right apical pneumothorax. Indwelling right apical chest tube and basilar mediastinal drain. Subcutaneous emphysema overlying the right hemithorax.  Cardiomegaly with prosthetic valve and median sternotomy.  Endotracheal tube terminates 3.5 cm above the carina.  Left IJ venous catheter terminates in the junction of the left brachiocephalic vein and SVC. Interval removal of Swan-Ganz catheter.  Enteric tube courses into the stomach.  IMPRESSION: Interval removal of Swan-Ganz catheter.  Otherwise unchanged.   Electronically Signed   By: Julian Hy M.D.   On: 01/28/2015 11:23     ASSESSMENT / PLAN:  PULMONARY OETT 5/25 >>> A: VDRF following MVR 5/25 ARDS vs unilateral re-expansion pulmonary edema (favor ALI rt lung) improved Fio2 0.4 peep 5 Hx OSA RT pneumothorax due to Rt thoracotomy R lung was collapsed/down but now expanding well 5/30 P:   Full mechanical support, not able to extubate 5/30 ARDS protocol, cont peep 5/ fio2 0.40 VAP bundle. Xopenex  on schedule   CARDIOVASCULAR R IJ Swan 5/25 >>>5/29 R IJ CVL 5/29>> L radial A line 5/25 >>> A:  Cardiogenic shock Severe MR - s/p minimally invasive MVR 01/24/15 H/o CAD, HTN, HLD Chronic dCHF Hx PAF  P:  Management per TCTS Currently on  milrinone,  Now off pressors   RENAL A:   AKI resolved , Cr normal I/O even 5/29  P:   Follow Bmet Cont diuresis, on lasix drip, dose increased 5/30  GASTROINTESTINAL A:   GI prophylaxis Nutrition GERD P:   SUP: Per tube  Pantoprazole. NPO. Now on  tube feeds to primary team  HEMATOLOGIC A:   VTE Prophylaxis  P:  On lovenox dvt proph Follow CBC  INFECTIOUS A:   HCAP R lung  Pos response to aztreonam/vanco. All c/s neg to date Resp c/s 5/28>>>NGTD>> P:   Off cefuroxime 5/27 Abx: aztreonam start date 5/27 > Abx: Vancomycin, start date 5/25 > Cont abx   ENDOCRINE A:   DM  P:   SSI CBG monitoring  NEUROLOGIC A:   Acute metabolic encephalopathy  P:   Sedation:  Fentanyl, now off versed drip RASS goal: -1 Daily WUA.  CC time 35 minutes. Spouse updated at bedside 01/29/2015  Mariel Sleet Beeper  [phone]  Cell  [phone]  If no response or cell goes to voicemail, call beeper [place] 01/29/2015, 9:58 AM

## 2015-01-29 NOTE — Progress Notes (Signed)
5 Days Post-Op Procedure(s) (LRB): TRANSESOPHAGEAL ECHOCARDIOGRAM (TEE) (N/A) Re-Operation, MINIMALLY INVASIVE MITRAL VALVE (MV) REPLACEMENT. (Right) Subjective: Continuing aggressive diuresis with improvement chest x-ray Patient tolerating pressure support cycles with reduced level of sedation INR therapeutic with Coumadin. Per Tube Stable off norepinephrine No fever, continuing empiric anabiotic's Controlled atrial fibrillation Objective: Vital signs in last 24 hours: Temp:  [99.5 F (37.5 C)-100.9 F (38.3 C)] 100.4 F (38 C) (05/30 0700) Pulse Rate:  [97-114] 103 (05/30 1533) Cardiac Rhythm:  [-] Sinus tachycardia (05/30 0800) Resp:  [0-30] 26 (05/30 1533) BP: (88-137)/(37-70) 123/54 mmHg (05/30 1533) SpO2:  [97 %-100 %] 100 % (05/30 1533) Arterial Line BP: (95-155)/(46-70) 155/67 mmHg (05/30 1000) FiO2 (%):  [40 %] 40 % (05/30 1533) Weight:  [257 lb 0.9 oz (116.6 kg)] 257 lb 0.9 oz (116.6 kg) (05/30 0300)  Hemodynamic parameters for last 24 hours: CVP:  [7 mmHg-24 mmHg] 12 mmHg  Intake/Output from previous day: 05/29 0701 - 05/30 0700 In: 3837.8 [I.V.:1287.8; NG/GT:2000; IV Piggyback:550] Out: 4193 [Urine:3200; Chest Tube:510] Intake/Output this shift: Total I/O In: 513.9 [I.V.:138.9; NG/GT:225; IV Piggyback:150] Out: 830 [Urine:650; Chest Tube:180]    Lab Results:  Recent Labs  01/28/15 0400 01/29/15 0350  WBC 9.2 6.3  HGB 10.4* 10.5*  HCT 32.9* 33.4*  PLT 68* 74*   BMET:  Recent Labs  01/28/15 1529 01/29/15 0350  NA 134* 137  K 3.9 3.7  CL 104 105  CO2 26 27  GLUCOSE 151* 117*  BUN 28* 29*  CREATININE 0.95 1.02  CALCIUM 7.1* 7.3*    PT/INR:  Recent Labs  01/29/15 0350  LABPROT 21.7*  INR 1.90*   ABG    Component Value Date/Time   PHART 7.327* 01/26/2015 0425   HCO3 25.4* 01/26/2015 0425   TCO2 22 01/27/2015 1620   ACIDBASEDEF 1.0 01/26/2015 0425   O2SAT 69.7 01/29/2015 0415   CBG (last 3)   Recent Labs  01/29/15 0435  01/29/15 0804 01/29/15 1149  GLUCAP 114* 144* 112*    Assessment/Plan: S/P Procedure(s) (LRB): TRANSESOPHAGEAL ECHOCARDIOGRAM (TEE) (N/A) Re-Operation, MINIMALLY INVASIVE MITRAL VALVE (MV) REPLACEMENT. (Right) Continue current care Vent wean per critical care medicine   LOS: 5 days    Lance Cook 01/29/2015

## 2015-01-29 NOTE — Progress Notes (Signed)
CT surgery p.m. Rounds  Patient had excellent day tolerated 5 hours of pressure support weaning, more alert and calm after reduction of sedation Excellent urine output greater than 1.5 L first shift P.m. potassium pending for supplementation Hemodynamics stable, controlled atrial fibrillation INR remains greater than 2.0 on Coumadin Receiving target tube feeds, had bowel movement 2

## 2015-01-29 NOTE — Progress Notes (Signed)
ANTIBIOTIC CONSULT NOTE - INITIAL  Pharmacy Consult for Vancomycin and Aztreonam Indication: pneumonia  Allergies  Allergen Reactions  . Penicillins     REACTION: Hives  . Adhesive [Tape] Other (See Comments)    Shin irritation    Patient Measurements: Height: 5\' 1"  (154.9 cm) Weight: 257 lb 0.9 oz (116.6 kg) IBW/kg (Calculated) : 52.3 Adjusted Body Weight: n/a  Vital Signs: Temp: 100.5 F (38.1 C) (05/30 2300) Temp Source: Core (Comment) (05/30 2000) BP: 123/54 mmHg (05/30 1533) Pulse Rate: 88 (05/30 2300) Intake/Output from previous day: 05/29 0701 - 05/30 0700 In: 3837.8 [I.V.:1287.8; NG/GT:2000; IV Piggyback:550] Out: 5462 [Urine:3200; Chest Tube:510] Intake/Output from this shift: Total I/O In: 666.8 [I.V.:106.8; NG/GT:360; IV Piggyback:200] Out: 880 [Urine:800; Chest Tube:80]  Labs:  Recent Labs  01/27/15 0430  01/28/15 0400 01/28/15 1529 01/29/15 0350 01/29/15 1855  WBC 13.8*  --  9.2  --  6.3  --   HGB 11.2*  < > 10.4*  --  10.5* 10.5*  PLT 79*  --  68*  --  74*  --   CREATININE 0.89  < > 0.98 0.95 1.02  --   < > = values in this interval not displayed. Estimated Creatinine Clearance: 92.4 mL/min (by C-G formula based on Cr of 1.02).  Recent Labs  01/29/15 2235  [facility] 15     Microbiology: Recent Results (from the past 720 hour(s))  Surgical pcr screen     Status: Abnormal   Collection Time: 01/22/15 12:10 PM  Result Value Ref Range Status   MRSA, PCR POSITIVE (A) NEGATIVE Final   Staphylococcus aureus POSITIVE (A) NEGATIVE Final    Comment:        The Xpert SA Assay (FDA approved for NASAL specimens in patients over 53 years of age), is one component of a comprehensive surveillance program.  Test performance has been validated by [facility] for patients greater than or equal to 30 year old. It is not intended to diagnose infection nor to guide or monitor treatment.   Culture, respiratory (NON-Expectorated)     Status: None  (Preliminary result)   Collection Time: 01/27/15  5:48 PM  Result Value Ref Range Status   Specimen Description TRACHEAL ASPIRATE  Final   Special Requests NONE  Final   Gram Stain   Final    FEW WBC PRESENT, PREDOMINANTLY MONONUCLEAR RARE SQUAMOUS EPITHELIAL CELLS PRESENT NO ORGANISMS SEEN Performed at [facility]    Culture   Final    NO GROWTH 1 DAY Performed at [facility]    Report Status PENDING  Incomplete   Assessment: 54 yo male s/p minimally invasive MVR 5/25 who remained hypoxemia after procedure.  Pharmacy asked to continue empiric antibiotic coverage with vancomycin and azactam.  Patient did receive post-op doses of cefuroxime, and vancomycin (last dose 1g at 2 AM 5/26).  Initial VT is therapeutic at 15 on vancomycin 750mg  q12h. Pt is febrile to 100.5, WBC wnl, sCr 1.02.  Goal of Therapy:  Vancomycin trough level 15-20 mcg/ml  Plan:  Increase vancomycin to 1g IV q12h Aztreonam 1g IV q 8 hrs F/u cultures, clinical course and renal function Vancomycin trough at steady-state as indicated  Andrey Cota. Diona Foley, PharmD Clinical Pharmacist Pager [phone] 01/29/2015 11:20 PM

## 2015-01-30 ENCOUNTER — Inpatient Hospital Stay (HOSPITAL_COMMUNITY): Payer: Medicare HMO

## 2015-01-30 DIAGNOSIS — A0472 Enterocolitis due to Clostridium difficile, not specified as recurrent: Secondary | ICD-10-CM | POA: Diagnosis not present

## 2015-01-30 LAB — CULTURE, RESPIRATORY W GRAM STAIN

## 2015-01-30 LAB — CBC
HEMATOCRIT: 33.3 % — AB (ref 39.0–52.0)
Hemoglobin: 10.3 g/dL — ABNORMAL LOW (ref 13.0–17.0)
MCH: 25.8 pg — AB (ref 26.0–34.0)
MCHC: 30.9 g/dL (ref 30.0–36.0)
MCV: 83.5 fL (ref 78.0–100.0)
Platelets: 85 10*3/uL — ABNORMAL LOW (ref 150–400)
RBC: 3.99 MIL/uL — AB (ref 4.22–5.81)
RDW: 17.5 % — ABNORMAL HIGH (ref 11.5–15.5)
WBC: 7.1 10*3/uL (ref 4.0–10.5)

## 2015-01-30 LAB — CARBOXYHEMOGLOBIN
Carboxyhemoglobin: 1.9 % — ABNORMAL HIGH (ref 0.5–1.5)
Methemoglobin: 0.9 % (ref 0.0–1.5)
O2 Saturation: 75.4 %
Total hemoglobin: 10.4 g/dL — ABNORMAL LOW (ref 13.5–18.0)

## 2015-01-30 LAB — POCT I-STAT 3, ART BLOOD GAS (G3+)
ACID-BASE EXCESS: 2 mmol/L (ref 0.0–2.0)
Bicarbonate: 30.7 mEq/L — ABNORMAL HIGH (ref 20.0–24.0)
O2 Saturation: 98 %
PH ART: 7.224 — AB (ref 7.350–7.450)
TCO2: 33 mmol/L (ref 0–100)
pCO2 arterial: 75.5 mmHg (ref 35.0–45.0)
pO2, Arterial: 127 mmHg — ABNORMAL HIGH (ref 80.0–100.0)

## 2015-01-30 LAB — BASIC METABOLIC PANEL
Anion gap: 7 (ref 5–15)
BUN: 32 mg/dL — AB (ref 6–20)
CALCIUM: 7.3 mg/dL — AB (ref 8.9–10.3)
CO2: 29 mmol/L (ref 22–32)
Chloride: 102 mmol/L (ref 101–111)
Creatinine, Ser: 0.94 mg/dL (ref 0.61–1.24)
GFR calc Af Amer: >60 mL/min (ref >60)
Glucose, Bld: 135 mg/dL — ABNORMAL HIGH (ref 65–99)
Potassium: 3.3 mmol/L — ABNORMAL LOW (ref 3.5–5.1)
Sodium: 138 mmol/L (ref 135–145)

## 2015-01-30 LAB — GLUCOSE, CAPILLARY
GLUCOSE-CAPILLARY: 102 mg/dL — AB (ref 65–99)
GLUCOSE-CAPILLARY: 108 mg/dL — AB (ref 65–99)
GLUCOSE-CAPILLARY: 125 mg/dL — AB (ref 65–99)
Glucose-Capillary: 104 mg/dL — ABNORMAL HIGH (ref 65–99)
Glucose-Capillary: 111 mg/dL — ABNORMAL HIGH (ref 65–99)
Glucose-Capillary: 104 mg/dL — ABNORMAL HIGH (ref 65–99)
Glucose-Capillary: 103 mg/dL — ABNORMAL HIGH (ref 65–99)

## 2015-01-30 LAB — PROTIME-INR
INR: 1.66 — ABNORMAL HIGH (ref 0.00–1.49)
Prothrombin Time: 19.6 seconds — ABNORMAL HIGH (ref 11.6–15.2)

## 2015-01-30 LAB — CLOSTRIDIUM DIFFICILE BY PCR: Toxigenic C. Difficile by PCR: POSITIVE — AB

## 2015-01-30 LAB — CULTURE, RESPIRATORY: Culture: NO GROWTH

## 2015-01-30 MED ORDER — METRONIDAZOLE 500 MG PO TABS: 500.0000 mg | ORAL_TABLET | Freq: Three times a day (TID) | ORAL | Status: DC

## 2015-01-30 MED ORDER — BUDESONIDE 0.25 MG/2ML IN SUSP
0.2500 mg | Freq: Four times a day (QID) | RESPIRATORY_TRACT | Status: DC
  Administered 2015-01-30 – 2015-01-31 (×2): 0.25 mg via RESPIRATORY_TRACT
  Filled 2015-01-30 (×6): qty 2

## 2015-01-30 MED ORDER — SODIUM CHLORIDE 0.9 % IJ SOLN
10.0000 mL | Freq: Two times a day (BID) | INTRAMUSCULAR | Status: DC
  Administered 2015-01-30: 30 mL
  Administered 2015-01-31 – 2015-02-07 (×11): 10 mL

## 2015-01-30 MED ORDER — SODIUM CHLORIDE 0.9 % IJ SOLN
10.0000 mL | INTRAMUSCULAR | Status: DC | PRN
  Administered 2015-02-07: 30 mL
  Administered 2015-02-08: 10 mL
  Filled 2015-01-30 (×2): qty 40

## 2015-01-30 MED ORDER — BUDESONIDE 0.25 MG/2ML IN SUSP
0.2500 mg | Freq: Four times a day (QID) | RESPIRATORY_TRACT | Status: DC
  Administered 2015-01-30: 0.25 mg via RESPIRATORY_TRACT
  Filled 2015-01-30 (×4): qty 2

## 2015-01-30 MED ORDER — POTASSIUM CHLORIDE 10 MEQ/50ML IV SOLN
10.0000 meq | INTRAVENOUS | Status: AC
  Administered 2015-01-30 (×3): 10 meq via INTRAVENOUS
  Filled 2015-01-30 (×3): qty 50

## 2015-01-30 MED ORDER — POTASSIUM CHLORIDE 10 MEQ/50ML IV SOLN: 10.0000 meq | INTRAVENOUS | Status: DC

## 2015-01-30 MED ORDER — WARFARIN SODIUM 7.5 MG PO TABS
7.5000 mg | ORAL_TABLET | Freq: Once | ORAL | Status: AC
  Administered 2015-01-30: 7.5 mg via ORAL
  Filled 2015-01-30: qty 1

## 2015-01-30 MED ORDER — GERHARDT'S BUTT CREAM
TOPICAL_CREAM | CUTANEOUS | Status: DC | PRN
  Administered 2015-01-30: 18:00:00 via TOPICAL
  Filled 2015-01-30 (×2): qty 1

## 2015-01-30 MED ORDER — METRONIDAZOLE 50 MG/ML ORAL SUSPENSION
500.0000 mg | Freq: Three times a day (TID) | ORAL | Status: DC
  Administered 2015-01-30 – 2015-02-02 (×9): 500 mg
  Filled 2015-01-30 (×12): qty 10

## 2015-01-30 MED ORDER — ACETAMINOPHEN 325 MG PO TABS
650.0000 mg | ORAL_TABLET | ORAL | Status: DC | PRN
  Administered 2015-01-30 – 2015-01-31 (×2): 650 mg
  Filled 2015-01-30 (×3): qty 2

## 2015-01-30 NOTE — Progress Notes (Signed)
PULMONARY / CRITICAL CARE MEDICINE   Name: Lance Cook MRN: [id number] DOB: 1961/08/06    ADMISSION DATE:  01/24/2015 CONSULTATION DATE:  01/30/2015  REFERRING MD :  Roxy Manns  CHIEF COMPLAINT:  Hypoxemia post op  INITIAL PRESENTATION: 54 y.o. M with extensive cardiac hx including severe MR.  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 Roxy Manns via rt thoracotomy.  Following procedure, remained hypoxemic and had edema vs ARDS on CXR.  PCCM consulted for recs. 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 Failed SBT. + F/C. Episode of desaturation later in day with mucus plugging   SUBJECTIVE: RASS -1, + F/C  VITAL SIGNS: Temp:  [99.7 F (37.6 C)-101.2 F (38.4 C)] 101.2 F (38.4 C) (05/31 1600) Pulse Rate:  [88-141] 106 (05/31 1600) Resp:  [21-32] 21 (05/31 1600) BP: (82-160)/(55-76) 82/56 mmHg (05/31 1600) SpO2:  [94 %-100 %] 100 % (05/31 1600) Arterial Line BP: (94-151)/(47-70) 99/53 mmHg (05/31 1600) FiO2 (%):  [40 %] 40 % (05/31 1600) Weight:  [115.8 kg (255 lb 4.7 oz)] 115.8 kg (255 lb 4.7 oz) (05/31 0500) HEMODYNAMICS: CVP:  [8 mmHg-30 mmHg] 12 mmHg VENTILATOR SETTINGS: Vent Mode:  [-] PRVC FiO2 (%):  [40 %] 40 % Set Rate:  [20 bmp-26 bmp] 20 bmp Vt Set:  [450 mL] 450 mL PEEP:  [5 cmH20] 5 cmH20 Pressure Support:  [12 cmH20] 12 cmH20 Plateau Pressure:  [16 cmH20-26 cmH20] 21 cmH20 INTAKE / OUTPUT: Intake/Output      05/30 0701 - 05/31 0700 05/31 0701 - 06/01 0700   I.V. (mL/kg) 921.4 (8) 352.8 (3)   NG/GT 2045 645   IV Piggyback 800 150   Total Intake(mL/kg) 3766.4 (32.5) 1147.8 (9.9)   Urine (mL/kg/hr) 4100 (1.5) 1120 (1)   Emesis/NG output  0 (0)   Stool     Chest Tube 570 (0.2) 440 (0.4)   Total Output 4670 1560   Net -903.6 -412.2          PHYSICAL EXAMINATION: General:  Obese, intubated Neuro: MAEs HEENT: WNL Cardiovascular: Reg, no M Lungs: no wheezes, crepitations  on R Abdomen: obese, soft, NT Ext: symmetric edema  LABS:  CBC  Recent Labs Lab 01/28/15 0400 01/29/15 0350 01/29/15 1855 01/30/15 0430  WBC 9.2 6.3  --  7.1  HGB 10.4* 10.5* 10.5* 10.3*  HCT 32.9* 33.4* 31.0* 33.3*  PLT 68* 74*  --  85*   Coag's  Recent Labs Lab 01/24/15 1850  01/28/15 0400 01/29/15 0350 01/30/15 0430  APTT 41*  --   --   --   --   INR 1.68*  < > 2.74* 1.90* 1.66*  < > = values in this interval not displayed. BMET  Recent Labs Lab 01/28/15 1529 01/29/15 0350 01/29/15 1855 01/30/15 0430  NA 134* 137 139 138  K 3.9 3.7 3.7 3.3*  CL 104 105  --  102  CO2 26 27  --  29  BUN 28* 29*  --  32*  CREATININE 0.95 1.02  --  0.94  GLUCOSE 151* 117* 142* 135*   Electrolytes  Recent Labs Lab 01/25/15 0200 01/25/15 1530  01/28/15 1529 01/29/15 0350 01/30/15 0430  CALCIUM 7.2*  --   < > 7.1* 7.3* 7.3*  MG 2.5* 2.1  --   --   --   --   < > = values in this interval not displayed.  Sepsis Markers No results for input(s): LATICACIDVEN, PROCALCITON, O2SATVEN in the last 168 hours. ABG  Recent Labs Lab 01/25/15 0457 01/25/15 1536 01/26/15 0425  PHART 7.262* 7.311* 7.327*  PCO2ART 52.2* 47.2* 49.1*  PO2ART 110.0* 93.0 144.0*   Liver Enzymes  Recent Labs Lab 01/26/15 0420  AST 123*  ALT 40  ALKPHOS 41  BILITOT 1.2  ALBUMIN 2.1*   Cardiac Enzymes No results for input(s): TROPONINI, PROBNP in the last 168 hours. Glucose  Recent Labs Lab 01/29/15 1541 01/29/15 2007 01/29/15 2333 01/30/15 0431 01/30/15 0831 01/30/15 1152  GLUCAP 123* 114* 102* 108* 104* 125*    Imaging Dg Chest Port 1 View  01/30/2015   CLINICAL DATA:  Respiratory failure.  EXAM: PORTABLE CHEST - 1 VIEW  COMPARISON:  Jan 29, 2015.  FINDINGS: Stable cardiomegaly. Stable support apparatus. Status post cardiac valve repair. No pneumothorax or pleural effusion is noted. Stable right lung opacity is noted concerning for pneumonia or edema. Bony thorax appears intact.   IMPRESSION: Stable support apparatus. Stable right lower lobe opacity concerning for pneumonia or edema.   Electronically Signed   By: Marijo Conception, M.D.   On: 01/30/2015 15:47   Dg Chest Port 1 View  01/30/2015   CLINICAL DATA:  54 year old male status post minimally invasive mitral valve replacement. Initial encounter.  EXAM: PORTABLE CHEST - 1 VIEW  COMPARISON:  01/29/2015 and earlier.  FINDINGS: Portable AP semi upright view at 0712 hours. Stable endotracheal tube. Stable visible enteric tube. Stable right chest tube. Stable left IJ approach central venous catheter which appears to terminate in the midline. Epicardial pacer wires remain in place. Multiple EKG leads and other wires project over the chest.  Lower lung volumes. Stable cardiac size and mediastinal contours. Sequelae of median sternotomy. Prosthetic cardiac valve again noted. No pneumothorax. Stable pulmonary vascular congestion. Probable small right effusion.  IMPRESSION: 1.  Stable lines and tubes. 2. Mildly lower lung volumes. Stable pulmonary vascular congestion. Probable small right effusion. No pneumothorax.   Electronically Signed   By: Genevie Ann M.D.   On: 01/30/2015 08:19     ASSESSMENT / PLAN:  PULMONARY OETT 5/25 >>  A: Acute hypoxic resp failure R sided AS dz - HCAP vs asymmetric edema OSA R lung atelectasis - resolved P:   Cont vent support - settings reviewed and/or adjusted Cont vent bundle Daily SBT if/when meets criteria  CARDIOVASCULAR A:  Cardiogenic shock, resolved Severe MR - s/p minimally invasive MVR 01/24/15 H/o CAD, HTN, HLD Chronic dCHF Hx PAF P:  Management per TCTS  RENAL A:   AKI, resolved P:   Monitor BMET intermittently Monitor I/Os Correct electrolytes as indicated  GASTROINTESTINAL A:   GERD P:   SUP: enteral PPI Cont TFs  HEMATOLOGIC A:   ICU acquired anemia P:  DVT px: warfarin (mech valve) Monitor CBC intermittently Transfuse per usual ICU  guidelines   INFECTIOUS A:   HCAP R lung Pos response to aztreonam/vanco. All c/s neg to date Resp c/s 5/28>>>NGTD>> P:   Off cefuroxime 5/27 Abx: aztreonam start date 5/27 > Abx: Vancomycin, start date 5/25 > Cont abx   ENDOCRINE A:   DM2 P:   Cont SSI  NEUROLOGIC A:   Acute metabolic encephalopathy, resolved P:   RASS goal: -1, -2 Cont current emds  CC time 35 minutes.  Merton Border, MD ; [facility] [phone].  After 5:30 PM or weekends, call [phone]

## 2015-01-30 NOTE — Progress Notes (Signed)
At [place], microbiology tech called to confirm that patient tested positive for CDIFF from this morning 5/31 0639 stool sample. MD at bedside and notified. Enteric precautions initiated. Richardean Sale, RN

## 2015-01-30 NOTE — Progress Notes (Signed)
Peripherally Inserted Central Catheter/Midline Placement  The IV Nurse has discussed with the patient and/or persons authorized to consent for the patient, the purpose of this procedure and the potential benefits and risks involved with this procedure.  The benefits include less needle sticks, lab draws from the catheter and patient may be discharged home with the catheter.  Risks include, but not limited to, infection, bleeding, blood clot (thrombus formation), and puncture of an artery; nerve damage and irregular heat beat.  Alternatives to this procedure were also discussed.  PICC/Midline Placement Documentation  PICC Triple Lumen 06/30/12 PICC Right Basilic 43 cm 0 cm (Active)  Exposed Catheter (cm) 0 cm 01/30/2015  6:28 PM  Dressing Change Due 02/06/15 01/30/2015  6:28 PM       Valor Quaintance, Maricela Bo 01/30/2015, 6:30 PM

## 2015-01-30 NOTE — Progress Notes (Signed)
RT Note: RT called stat to bedside due to desat & diminished BS on right. Suctioned & lavaged large, thick mucus plug. Sats increased to 100%, BBS equal, pt still labored. RT to monitor.

## 2015-01-30 NOTE — Progress Notes (Signed)
eLink Physician-Brief Progress Note Patient Name: Lance Cook DOB: Dec 10, 1960 MRN: [id number]   Date of Service  01/30/2015  HPI/Events of Note  Patient noted to have sudden desaturation with turning, RN suctioned large mucus plug out.  eICU Interventions  F\u cxr     Intervention Category Intermediate Interventions: Respiratory distress - evaluation and management  Amerika Nourse 01/30/2015, 3:12 PM

## 2015-01-30 NOTE — Progress Notes (Signed)
TCTS BRIEF SICU PROGRESS NOTE  6 Days Post-Op  S/P Procedure(s) (LRB): TRANSESOPHAGEAL ECHOCARDIOGRAM (TEE) (N/A) Re-Operation, MINIMALLY INVASIVE MITRAL VALVE (MV) REPLACEMENT. (Right)   Patient failed attempted spontaneous breathing trial earlier today w/ mucous plugging Currently stable on full vent support  Plan: Continue current plan  Rexene Alberts 01/30/2015 6:24 PM

## 2015-01-30 NOTE — Progress Notes (Signed)
Patient dropped saturation level to 77% with constant high pressure alarms on ventilator. Tachypnea , tachycardic and diaphoretic. Could not hear any breath sounds on the right, attempted to suction and nothing coming up the endotracheal tube. Called RT. RT at beside and successfully got large mucous plug from ett. Now breath sounds are equal and rhonchus bilaterally. Patient on full support on the ventilator with prn versed given. Vital signs now more stable as well as work of breathing. Dr. Roxy Manns paged and made aware of situation as well as e-link MD. Dr. Alva Garnet at beside to review chest xray and blood gas. No new orders. Will continue to monitor.  Lance Cook

## 2015-01-30 NOTE — Progress Notes (Addendum)
[address]       [place] [address]             [phone]        CARDIOTHORACIC SURGERY PROGRESS NOTE   R6 Days Post-Op Procedure(s) (LRB): TRANSESOPHAGEAL ECHOCARDIOGRAM (TEE) (N/A) Re-Operation, MINIMALLY INVASIVE MITRAL VALVE (MV) REPLACEMENT. (Right)  Subjective: Lightly sedated on vent.  Denies pain.  Looks comfortable.  Objective: Vital signs: BP Readings from Last 1 Encounters:  01/29/15 123/54   Pulse Readings from Last 1 Encounters:  01/30/15 102   Resp Readings from Last 1 Encounters:  01/30/15 24   Temp Readings from Last 1 Encounters:  01/30/15 99.8 F (37.7 C) Core (Comment)    Hemodynamics: CVP:  [7 mmHg-30 mmHg] 23 mmHg  Mixed venous co-ox 75.4%  Physical Exam:  Rhythm:   Afib w/ controlled rate  Breath sounds: Coarse but fairly clear  Heart sounds:  irregular  Incisions:  Clean and dry  Abdomen:  Soft, non-distended, non-tender, + some diarrhea reported overnight  Extremities:  Warm, well-perfused  Chest tubes:  Low volume thin serosanguinous output, no air leak    Intake/Output from previous day: 05/30 0701 - 05/31 0700 In: 3766.4 [I.V.:921.4; NG/GT:2045; IV Piggyback:800] Out: 8144 [Urine:4100; Chest Tube:570] Intake/Output this shift: Total I/O In: 136.7 [I.V.:41.7; NG/GT:95] Out: 240 [Urine:100; Chest Tube:140]  Lab Results:  CBC: Recent Labs  01/29/15 0350 01/29/15 1855 01/30/15 0430  WBC 6.3  --  7.1  HGB 10.5* 10.5* 10.3*  HCT 33.4* 31.0* 33.3*  PLT 74*  --  85*    BMET:  Recent Labs  01/29/15 0350 01/29/15 1855 01/30/15 0430  NA 137 139 138  K 3.7 3.7 3.3*  CL 105  --  102  CO2 27  --  29  GLUCOSE 117* 142* 135*  BUN 29*  --  32*  CREATININE 1.02  --  0.94  CALCIUM 7.3*  --  7.3*     CBG (last 3)   Recent Labs  01/29/15 2007 01/29/15 2333 01/30/15 0431  GLUCAP 114* 102* 108*    ABG    Component Value Date/Time   PHART 7.327* 01/26/2015 0425   PCO2ART 49.1* 01/26/2015  0425   PO2ART 144.0* 01/26/2015 0425   HCO3 25.4* 01/26/2015 0425   TCO2 22 01/27/2015 1620   ACIDBASEDEF 1.0 01/26/2015 0425   O2SAT 75.4 01/30/2015 0443    CXR: PORTABLE CHEST - 1 VIEW  COMPARISON: 01/29/2015 and earlier.  FINDINGS: Portable AP semi upright view at 0712 hours. Stable endotracheal tube. Stable visible enteric tube. Stable right chest tube. Stable left IJ approach central venous catheter which appears to terminate in the midline. Epicardial pacer wires remain in place. Multiple EKG leads and other wires project over the chest.  Lower lung volumes. Stable cardiac size and mediastinal contours. Sequelae of median sternotomy. Prosthetic cardiac valve again noted. No pneumothorax. Stable pulmonary vascular congestion. Probable small right effusion.  IMPRESSION: 1. Stable lines and tubes. 2. Mildly lower lung volumes. Stable pulmonary vascular congestion. Probable small right effusion. No pneumothorax.   Electronically Signed  By: Genevie Ann M.D.  On: 01/30/2015 08:19  Assessment/Plan: S/P Procedure(s) (LRB): TRANSESOPHAGEAL ECHOCARDIOGRAM (TEE) (N/A) Re-Operation, MINIMALLY INVASIVE MITRAL VALVE (MV) REPLACEMENT. (Right)  Looks ready for possible acute vent wean and extubation Hemodynamically stable off all pressors except low dose milrinone, rate-controlled Afib O2 sats 99-100% on 40% FiO2, CXR appearance much improved Diuresing very well on lasix drip Low-grade fever w/ normal WBC C.  Difficile enterocolitis Expected post op acute blood loss anemia, stable Hypokalemia, diuretic-induced Thrombocytopenia stable Warfarin anticoagulation for mechanical heart valves and atrial fibrillation, INR down to 1.7 today Type II diabetes mellitus, excellent glycemic control Possible HCAP although minimal clinical signs - now on empiric Vanc + Aztreonam   CPAP/PS trial - consider acute vent wean and extubation  Start Flagyl via tube or oral for C.  Diff  Consider stopping Vanc + Aztreonam after empiric course per Pulm/CCM completed  Increase coumadin dose  Needs line change - will get PICC line then d/c old left IJ  Supplement K+  Lance Cook 01/30/2015 8:38 AM

## 2015-01-31 ENCOUNTER — Inpatient Hospital Stay (HOSPITAL_COMMUNITY): Payer: Medicare HMO

## 2015-01-31 LAB — GLUCOSE, CAPILLARY
GLUCOSE-CAPILLARY: 93 mg/dL (ref 65–99)
Glucose-Capillary: 104 mg/dL — ABNORMAL HIGH (ref 65–99)
Glucose-Capillary: 88 mg/dL (ref 65–99)
Glucose-Capillary: 89 mg/dL (ref 65–99)
Glucose-Capillary: 94 mg/dL (ref 65–99)
Glucose-Capillary: 96 mg/dL (ref 65–99)

## 2015-01-31 LAB — BASIC METABOLIC PANEL
Anion gap: 8 (ref 5–15)
BUN: 38 mg/dL — ABNORMAL HIGH (ref 6–20)
CO2: 32 mmol/L (ref 22–32)
Calcium: 7.7 mg/dL — ABNORMAL LOW (ref 8.9–10.3)
Chloride: 101 mmol/L (ref 101–111)
Creatinine, Ser: 1.01 mg/dL (ref 0.61–1.24)
GFR calc Af Amer: >60 mL/min (ref >60)
GFR calc non Af Amer: >60 mL/min (ref >60)
Glucose, Bld: 92 mg/dL (ref 65–99)
Potassium: 3.6 mmol/L (ref 3.5–5.1)
SODIUM: 141 mmol/L (ref 135–145)

## 2015-01-31 LAB — PROTIME-INR
INR: 1.56 — AB (ref 0.00–1.49)
Prothrombin Time: 18.7 seconds — ABNORMAL HIGH (ref 11.6–15.2)

## 2015-01-31 LAB — CARBOXYHEMOGLOBIN
Carboxyhemoglobin: 1.6 % — ABNORMAL HIGH (ref 0.5–1.5)
Methemoglobin: 0.9 % (ref 0.0–1.5)
O2 Saturation: 70.1 %
Total hemoglobin: 10.8 g/dL — ABNORMAL LOW (ref 13.5–18.0)

## 2015-01-31 LAB — CBC
HCT: 33.6 % — ABNORMAL LOW (ref 39.0–52.0)
HEMOGLOBIN: 10.3 g/dL — AB (ref 13.0–17.0)
MCH: 25.9 pg — AB (ref 26.0–34.0)
MCHC: 30.7 g/dL (ref 30.0–36.0)
MCV: 84.4 fL (ref 78.0–100.0)
Platelets: 117 10*3/uL — ABNORMAL LOW (ref 150–400)
RBC: 3.98 MIL/uL — ABNORMAL LOW (ref 4.22–5.81)
RDW: 17.5 % — AB (ref 11.5–15.5)
WBC: 8.1 10*3/uL (ref 4.0–10.5)

## 2015-01-31 LAB — CLOSTRIDIUM DIFFICILE BY PCR: Toxigenic C. Difficile by PCR: NEGATIVE

## 2015-01-31 MED ORDER — FREE WATER
200.0000 mL | Freq: Three times a day (TID) | Status: DC
  Administered 2015-01-31 – 2015-02-02 (×6): 200 mL

## 2015-01-31 MED ORDER — POTASSIUM CHLORIDE 10 MEQ/50ML IV SOLN
10.0000 meq | INTRAVENOUS | Status: AC
  Administered 2015-01-31 (×3): 10 meq via INTRAVENOUS
  Filled 2015-01-31 (×2): qty 50

## 2015-01-31 MED ORDER — METRONIDAZOLE 500 MG PO TABS: 500.0000 mg | ORAL_TABLET | Freq: Three times a day (TID) | ORAL | Status: DC

## 2015-01-31 MED ORDER — BUDESONIDE 0.25 MG/2ML IN SUSP
0.2500 mg | Freq: Four times a day (QID) | RESPIRATORY_TRACT | Status: DC
  Administered 2015-01-31 – 2015-02-02 (×10): 0.25 mg via RESPIRATORY_TRACT
  Filled 2015-01-31 (×16): qty 2

## 2015-01-31 MED ORDER — POTASSIUM CHLORIDE 10 MEQ/50ML IV SOLN
10.0000 meq | INTRAVENOUS | Status: AC | PRN
  Administered 2015-01-31 (×3): 10 meq via INTRAVENOUS
  Filled 2015-01-31 (×2): qty 50

## 2015-01-31 MED ORDER — WARFARIN SODIUM 7.5 MG PO TABS
7.5000 mg | ORAL_TABLET | Freq: Once | ORAL | Status: AC
Start: 2015-01-31 — End: 2015-01-31
  Administered 2015-01-31: 7.5 mg via ORAL
  Filled 2015-01-31: qty 1

## 2015-01-31 NOTE — Progress Notes (Addendum)
[address]       [place] [address]             [phone]        CARDIOTHORACIC SURGERY PROGRESS NOTE   R7 Days Post-Op Procedure(s) (LRB): TRANSESOPHAGEAL ECHOCARDIOGRAM (TEE) (N/A) Re-Operation, MINIMALLY INVASIVE MITRAL VALVE (MV) REPLACEMENT. (Right)  Subjective: Awake and alert on vent.  Looks comfortable.  Only complaint is ET tube.  Denies chest pain.  Denies abdominal pain.  Objective: Vital signs: BP Readings from Last 1 Encounters:  01/31/15 92/64   Pulse Readings from Last 1 Encounters:  01/31/15 93   Resp Readings from Last 1 Encounters:  01/31/15 20   Temp Readings from Last 1 Encounters:  01/31/15 100.9 F (38.3 C)     Hemodynamics: CVP:  [7 mmHg-22 mmHg] 11 mmHg  Mixed venous co-ox 70%  Physical Exam:  Rhythm:   Afib w/ controlled rate  Breath sounds: Coarse but fairly clear  Heart sounds:  irregular  Incisions:  Clean and dry  Abdomen:  Soft, non-distended, non-tender, + liquid stool  Extremities:  Warm, well-perfused    Intake/Output from previous day: 05/31 0701 - 06/01 0700 In: 3139.4 [I.V.:789.4; NG/GT:1850; IV Piggyback:500] Out: 1443 [Urine:3785; Chest Tube:730] Intake/Output this shift: Total I/O In: 90 [I.V.:25; NG/GT:65] Out: 320 [Urine:280; Chest Tube:40]  Lab Results:  CBC: Recent Labs  01/30/15 0430 01/31/15 0400  WBC 7.1 8.1  HGB 10.3* 10.3*  HCT 33.3* 33.6*  PLT 85* 117*    BMET:  Recent Labs  01/30/15 0430 01/31/15 0400  NA 138 141  K 3.3* 3.6  CL 102 101  CO2 29 32  GLUCOSE 135* 92  BUN 32* 38*  CREATININE 0.94 1.01  CALCIUM 7.3* 7.7*     CBG (last 3)   Recent Labs  01/30/15 2324 01/31/15 0351 01/31/15 0737  GLUCAP 103* 94 96    ABG    Component Value Date/Time   PHART 7.224* 01/30/2015 1521   PCO2ART 75.5* 01/30/2015 1521   PO2ART 127.0* 01/30/2015 1521   HCO3 30.7* 01/30/2015 1521   TCO2 33 01/30/2015 1521   ACIDBASEDEF 1.0 01/26/2015 0425   O2SAT 70.1  01/31/2015 0420    CXR: PORTABLE CHEST - 1 VIEW  COMPARISON: Jan 30, 2015  FINDINGS: Endotracheal tube tip is 2.5 cm above the carina. Nasogastric tube tip and side port are in the stomach. Central catheter tip is in the right atrium. There is a chest tube on the right. Temporary pacemaker wires are attached to the right heart. No demonstrable pneumothorax.  There is slight interstitial edema. There is mild bibasilar atelectasis. There is no airspace consolidation. Heart is enlarged with pulmonary vascularity within normal limits. Patient is status post mitral valve replacement.  IMPRESSION: Tube and catheter positions as described without pneumothorax. Mild bibasilar atelectasis and mild interstitial edema. Stable cardiomegaly.  Electronically Signed: By: Lowella Grip III M.D. On: 01/31/2015 08:18  Assessment/Plan: S/P Procedure(s) (LRB): TRANSESOPHAGEAL ECHOCARDIOGRAM (TEE) (N/A) Re-Operation, MINIMALLY INVASIVE MITRAL VALVE (MV) REPLACEMENT. (Right)  RESP:  VDRF due to ARDS, acute lung injury, acute exacerbation of chronic diastolic CHF and possible HCAP complicating chronic respiratory failure due to chronic diastolic CHF, OSA, chronic pulmonary hypertension.  Oxygenation and gas exchange good.  CXR looks good.  Overall much improved although still w/ fairly copious secretions.  Failed SBT yesterday due to mucous plugging - plan per Pulm/CCM team  CV:  Remains hemodynamically stable in chronic permanent rate-controlled Afib.  Off all pressors  and inotropic agents, on low dose metoprolol and digoxin for rate control.  Acute exacerbation of chronic diastolic CHF, diuresing well on lasix drip.  CVP relatively low (11) and co-ox 70%.   ID:  Possible HCAP on empiric Vanc + Aztreonam.  Resp culture 5/28 reported as "no growth".  Low grade fevers persist but patient is + for C. Diff enterocolitis.  WBC remains normal.  Abdominal exam benign.  Started on Flagyl via tube  5/31.  All lines changed except left radial arterial line - possibly could be removed.  RENAL: Diuresing well last 48 hours on lasix drip I/O's negative 1375 yesterday.  BUN/Creatinine up slightly 38/1.0 today.  Weight trending down but still 2 kg above pre-op baseline.  Continue lasix drip today.  HEME: Expected post op acute blood loss anemia, Hgb stable 10.3.  Post op thrombocytopenia resolving, platelet count up to 117k.   INR subtherapeutic on coumadin - continue increased dose today.  Avoiding heparin/lovenox due to concerns of possible HIT  FEN:  Tolerating tube feeds.  Electrolytes stable.  Diuretic-induced hypokalemia - replace  ENDO: Type II diabetes mellitus, excellent glycemic control on levemir + SSI  NEURO: Intact.  Lightly sedated on vent.  Comfortable  DISP:  Anticipate full recovery w/ chronic respiratory failure remaining primary limitation.    Rexene Alberts 01/31/2015 9:02 AM

## 2015-01-31 NOTE — Progress Notes (Addendum)
Nutrition Follow-up  DOCUMENTATION CODES:  Morbid obesity  INTERVENTION:  Tube feeding   Continue Vital High Protein at 65 ml/hr to provide 1560 kcals, 137 gm protein, 1304 ml of free water  NUTRITION DIAGNOSIS:  Inadequate oral intake related to inability to eat as evidenced by NPO status, ongoing  GOAL:  Provide needs based on ASPEN/SCCM guidelines, met  MONITOR:  TF tolerance, Weight trends, Labs, I & O's, Vent status  ASSESSMENT: 53 y.o. Male with with extensive cardiac hx including severe MR; following surgical procedure, pt remained hypoxemic and had edema vs ARDS on CXR.  Patient s/p procedure 5/25: MITRAL VALVE REPLACEMENT (RIGHT)  Patient is currently intubated on ventilator support MV: 8.2 L/min Temp (24hrs), Avg:101 F (38.3 C), Min:99.4 F (37.4 C), Max:101.8 F (38.8 C)   Vital High Protein currently infusing at goal rate of 65 ml/hr via OGT providing 1560 kcals, 137 gm protein, 1304 ml of free water.  Tolerating well.  Noted pt failed attempted spontaneous breathing trial 5/31 with mucous plugging.  Height:  Ht Readings from Last 1 Encounters:  01/24/15 5' 1" (1.549 m)    Weight:  Wt Readings from Last 1 Encounters:  01/31/15 249 lb 9 oz (113.2 kg)    Ideal Body Weight:  51 kg  Wt Readings from Last 10 Encounters:  01/31/15 249 lb 9 oz (113.2 kg)  01/22/15 244 lb 12.8 oz (111.041 kg)  01/16/15 237 lb (107.502 kg)  12/25/14 237 lb (107.502 kg)  12/18/14 240 lb (108.863 kg)  11/13/14 244 lb (110.678 kg)  10/06/14 242 lb 14.4 oz (110.179 kg)  05/23/14 230 lb (104.327 kg)  04/14/14 231 lb (104.781 kg)  04/02/14 233 lb 14.5 oz (106.1 kg)    BMI:  Body mass index is 47.18 kg/(m^2).  Estimated Nutritional Needs:  Kcal:  1287-1638  Protein:  130-140 grams  Fluid:  Per MD  Skin:  Stage II sacral wound  Diet Order:  NPO  EDUCATION NEEDS:  No education needs identified at this time   Intake/Output Summary (Last 24 hours) at  01/31/15 1512 Last data filed at 01/31/15 1500  Gross per 24 hour  Intake 3394.9 ml  Output   4884 ml  Net -1489.1 ml    Last BM:  5/31  Katie , RD, LDN Pager #: [phone] After-Hours Pager #: [phone]  

## 2015-01-31 NOTE — Progress Notes (Signed)
PULMONARY / CRITICAL CARE MEDICINE   Name: Lance Cook MRN: [id number] DOB: 20-May-1961    ADMISSION DATE:  01/24/2015 CONSULTATION DATE:  01/31/2015  REFERRING MD :  Roxy Manns  CHIEF COMPLAINT:  Hypoxemia post op  INITIAL PRESENTATION: 54 y.o. M with extensive cardiac hx including severe MR.  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 Roxy Manns via rt thoracotomy.  Following procedure, remained hypoxemic and had edema vs ARDS on CXR.  PCCM consulted for recs. 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 Failed SBT. + F/C. Episode of desaturation later in day with mucus plugging   SUBJECTIVE: RASS 0, + F/C. Tolerates PS 12 cm H2O  VITAL SIGNS: Temp:  [99.4 F (37.4 C)-101.8 F (38.8 C)] 100.8 F (38.2 C) (06/01 1552) Pulse Rate:  [80-104] 89 (06/01 1500) Resp:  [17-26] 24 (06/01 1500) BP: (84-115)/(41-79) 88/55 mmHg (06/01 1500) SpO2:  [90 %-100 %] 99 % (06/01 1500) Arterial Line BP: (93-138)/(43-65) 114/48 mmHg (06/01 1500) FiO2 (%):  [40 %] 40 % (06/01 1606) Weight:  [113.2 kg (249 lb 9 oz)] 113.2 kg (249 lb 9 oz) (06/01 0500) HEMODYNAMICS: CVP:  [7 mmHg-22 mmHg] 8 mmHg VENTILATOR SETTINGS: Vent Mode:  [-] PSV;CPAP FiO2 (%):  [40 %] 40 % Set Rate:  [20 bmp] 20 bmp Vt Set:  [450 mL] 450 mL PEEP:  [5 cmH20] 5 cmH20 Pressure Support:  [16 cmH20] 16 cmH20 Plateau Pressure:  [20 cmH20-23 cmH20] 21 cmH20 INTAKE / OUTPUT: Intake/Output      05/31 0701 - 06/01 0700 06/01 0701 - 06/02 0700   I.V. (mL/kg) 789.4 (7) 120 (1.1)   NG/GT 1850 780   IV Piggyback 500 400   Total Intake(mL/kg) 3139.4 (27.7) 1300 (11.5)   Urine (mL/kg/hr) 3785 (1.4) 1845 (1.7)   Emesis/NG output 0 (0)    Chest Tube 730 (0.3) 211 (0.2)   Total Output 4515 2056   Net -1375.6 -756          PHYSICAL EXAMINATION: General:  Obese, intubated Neuro: MAEs HEENT: WNL Cardiovascular: Reg, no M Lungs: no wheezes,  crepitations on R Abdomen: obese, soft, NT Ext: symmetric edema  LABS:  CBC  Recent Labs Lab 01/29/15 0350 01/29/15 1855 01/30/15 0430 01/31/15 0400  WBC 6.3  --  7.1 8.1  HGB 10.5* 10.5* 10.3* 10.3*  HCT 33.4* 31.0* 33.3* 33.6*  PLT 74*  --  85* 117*   Coag's  Recent Labs Lab 01/24/15 1850  01/29/15 0350 01/30/15 0430 01/31/15 0400  APTT 41*  --   --   --   --   INR 1.68*  < > 1.90* 1.66* 1.56*  < > = values in this interval not displayed. BMET  Recent Labs Lab 01/29/15 0350 01/29/15 1855 01/30/15 0430 01/31/15 0400  NA 137 139 138 141  K 3.7 3.7 3.3* 3.6  CL 105  --  102 101  CO2 27  --  29 32  BUN 29*  --  32* 38*  CREATININE 1.02  --  0.94 1.01  GLUCOSE 117* 142* 135* 92   Electrolytes  Recent Labs Lab 01/25/15 0200 01/25/15 1530  01/29/15 0350 01/30/15 0430 01/31/15 0400  CALCIUM 7.2*  --   < > 7.3* 7.3* 7.7*  MG 2.5* 2.1  --   --   --   --   < > = values in this interval not displayed. Sepsis  Markers No results for input(s): LATICACIDVEN, PROCALCITON, O2SATVEN in the last 168 hours. ABG  Recent Labs Lab 01/25/15 1536 01/26/15 0425 01/30/15 1521  PHART 7.311* 7.327* 7.224*  PCO2ART 47.2* 49.1* 75.5*  PO2ART 93.0 144.0* 127.0*   Liver Enzymes  Recent Labs Lab 01/26/15 0420  AST 123*  ALT 40  ALKPHOS 41  BILITOT 1.2  ALBUMIN 2.1*   Cardiac Enzymes No results for input(s): TROPONINI, PROBNP in the last 168 hours. Glucose  Recent Labs Lab 01/30/15 1739 01/30/15 1913 01/30/15 2324 01/31/15 0351 01/31/15 0737 01/31/15 1140  GLUCAP 111* 104* 103* 94 96 88    CXR: [place]    ASSESSMENT / PLAN:  PULMONARY ETT 5/25 >>  A: Acute hypoxic resp failure R sided AS dz - HCAP vs asymmetric edema OSA R lung atelectasis - resolved P:   Cont vent support - settings reviewed and/or adjusted Cont vent bundle Daily SBT if/when meets criteria  CARDIOVASCULAR A:  Cardiogenic shock, resolved Severe MR - s/p minimally  invasive MVR 01/24/15 H/o CAD, HTN, HLD Chronic dCHF Hx PAF P:  HD management per TCTS  RENAL A:   AKI, resolved P:   Monitor BMET intermittently Monitor I/Os Correct electrolytes as indicated  GASTROINTESTINAL A:   GERD P:   SUP: enteral PPI Cont TFs  HEMATOLOGIC A:   ICU acquired anemia P:  DVT px: warfarin (mech valve) Monitor CBC intermittently Transfuse per usual ICU guidelines   INFECTIOUS A:   Possible HCAP P:   Off cefuroxime 5/27 Abx: aztreonam start date 5/27 > Abx: Vancomycin, start date 5/25 > Check PCT Consider DC abx if PCT normal  ENDOCRINE A:   DM2 P:   Cont SSI  NEUROLOGIC A:   Acute metabolic encephalopathy, resolved P:   RASS goal: -1 Cont current meds  CC time 30 minutes.  Merton Border, MD ; [facility] [phone].  After 5:30 PM or weekends, call [phone]

## 2015-01-31 NOTE — Progress Notes (Signed)
Patient ID: Lance Cook, male   DOB: Jan 24, 1961, 54 y.o.   MRN: [id number]  SICU Evening Rounds:  Hemodynamically stable  Remains on vent for ARDS postop  Diuresing well on lasix drip  No labs tonight.  A/P: stable day. Continue current plans.

## 2015-01-31 NOTE — Care Management Note (Signed)
Case Management Note  Patient Details  Name: Lance Cook MRN: [id number] Date of Birth: 1961-04-30  Subjective/Objective:                    Action/Plan:   Expected Discharge Date:                  Expected Discharge Plan:  Skilled Nursing Facility  In-House Referral:  Clinical Social Work  Discharge planning Services     Post Acute Care Choice:    Choice offered to:     DME Arranged:    DME Agency:     HH Arranged:    [place] Agency:     Status of Service:  In process, will continue to follow  Medicare Important Message Given:    Date Medicare IM Given:    Medicare IM give by:    Date Additional Medicare IM Given:    Additional Medicare Important Message give by:     If discussed at [place] of Stay Meetings, dates discussed:  01/30/2015  Additional Comments:  Delrae Sawyers, RN 01/31/2015, 9:04 AM

## 2015-02-01 ENCOUNTER — Inpatient Hospital Stay (HOSPITAL_COMMUNITY): Payer: Medicare HMO

## 2015-02-01 LAB — GLUCOSE, CAPILLARY
GLUCOSE-CAPILLARY: 109 mg/dL — AB (ref 65–99)
GLUCOSE-CAPILLARY: 118 mg/dL — AB (ref 65–99)
GLUCOSE-CAPILLARY: 105 mg/dL — AB (ref 65–99)
Glucose-Capillary: 114 mg/dL — ABNORMAL HIGH (ref 65–99)
Glucose-Capillary: 106 mg/dL — ABNORMAL HIGH (ref 65–99)
Glucose-Capillary: 103 mg/dL — ABNORMAL HIGH (ref 65–99)

## 2015-02-01 LAB — CBC
HCT: 34.4 % — ABNORMAL LOW (ref 39.0–52.0)
HEMOGLOBIN: 10.3 g/dL — AB (ref 13.0–17.0)
MCH: 25.6 pg — AB (ref 26.0–34.0)
MCHC: 29.9 g/dL — AB (ref 30.0–36.0)
MCV: 85.4 fL (ref 78.0–100.0)
PLATELETS: 150 10*3/uL (ref 150–400)
RBC: 4.03 MIL/uL — AB (ref 4.22–5.81)
RDW: 18 % — ABNORMAL HIGH (ref 11.5–15.5)
WBC: 8.2 10*3/uL (ref 4.0–10.5)

## 2015-02-01 LAB — HIT PANEL (HEPARIN AB + SRA)
Heparin Induced Plt Ab: 0.227 OD (ref 0.000–0.400)
SRA .2 IU/mL UFH Ser-aCnc: <1 % (ref 0–20)
SRA 100IU/mL UFH Ser-aCnc: <1 % (ref 0–20)

## 2015-02-01 LAB — BASIC METABOLIC PANEL
ANION GAP: 10 (ref 5–15)
BUN: 35 mg/dL — AB (ref 6–20)
CO2: 34 mmol/L — ABNORMAL HIGH (ref 22–32)
Calcium: 7.7 mg/dL — ABNORMAL LOW (ref 8.9–10.3)
Chloride: 100 mmol/L — ABNORMAL LOW (ref 101–111)
Creatinine, Ser: 0.9 mg/dL (ref 0.61–1.24)
GLUCOSE: 104 mg/dL — AB (ref 65–99)
POTASSIUM: 3.3 mmol/L — AB (ref 3.5–5.1)
Sodium: 144 mmol/L (ref 135–145)

## 2015-02-01 LAB — PROTIME-INR
INR: 1.63 — ABNORMAL HIGH (ref 0.00–1.49)
PROTHROMBIN TIME: 19.3 s — AB (ref 11.6–15.2)

## 2015-02-01 LAB — PROCALCITONIN: Procalcitonin: 0.22 ng/mL

## 2015-02-01 MED ORDER — ACETAMINOPHEN 160 MG/5ML PO SOLN
650.0000 mg | ORAL | Status: DC | PRN
  Administered 2015-02-01: 650 mg
  Filled 2015-02-01: qty 20.3

## 2015-02-01 MED ORDER — WARFARIN SODIUM 7.5 MG PO TABS
7.5000 mg | ORAL_TABLET | Freq: Once | ORAL | Status: AC
  Administered 2015-02-01: 7.5 mg via ORAL
  Filled 2015-02-01: qty 1

## 2015-02-01 MED ORDER — POTASSIUM CHLORIDE 10 MEQ/50ML IV SOLN
10.0000 meq | INTRAVENOUS | Status: AC | PRN
  Administered 2015-02-01 – 2015-02-02 (×3): 10 meq via INTRAVENOUS
  Filled 2015-02-01: qty 50

## 2015-02-01 MED ORDER — POTASSIUM CHLORIDE 10 MEQ/50ML IV SOLN
INTRAVENOUS | Status: AC
  Administered 2015-02-01: 10 meq
  Filled 2015-02-01: qty 150

## 2015-02-01 MED ORDER — POTASSIUM CHLORIDE 20 MEQ/15ML (10%) PO SOLN
40.0000 meq | Freq: Two times a day (BID) | ORAL | Status: AC
  Administered 2015-02-01 (×2): 40 meq
  Filled 2015-02-01 (×3): qty 30

## 2015-02-01 NOTE — Progress Notes (Signed)
      [address]       [place], [address]             [phone]      Anxious. Pulled out a line earlier   BP 95/54 mmHg  Pulse 102  Temp(Src) 99.7 F (37.6 C) (Oral)  Resp 20  Ht 5\' 1"  (1.549 m)  Wt 243 lb 9.7 oz (110.5 kg)  BMI 46.05 kg/m2  SpO2 99%   Intake/Output Summary (Last 24 hours) at 02/01/15 1926 Last data filed at 02/01/15 1900  Gross per 24 hour  Intake 2832.5 ml  Output   7270 ml  Net -4437.5 ml   Stable day  Remo Lipps C. Roxan Hockey, MD [facility] [phone]

## 2015-02-01 NOTE — Progress Notes (Signed)
[address]       [place] [address]             [phone]        CARDIOTHORACIC SURGERY PROGRESS NOTE   R8 Days Post-Op Procedure(s) (LRB): TRANSESOPHAGEAL ECHOCARDIOGRAM (TEE) (N/A) Re-Operation, MINIMALLY INVASIVE MITRAL VALVE (MV) REPLACEMENT. (Right)  Subjective: Awake and alert on vent.  Denies pain.  Comfortable.  Objective: Vital signs: BP Readings from Last 1 Encounters:  02/01/15 91/57   Pulse Readings from Last 1 Encounters:  02/01/15 98   Resp Readings from Last 1 Encounters:  02/01/15 20   Temp Readings from Last 1 Encounters:  02/01/15 100.8 F (38.2 C) Oral    Hemodynamics: CVP:  [3 mmHg-16 mmHg] 7 mmHg  Physical Exam:  Rhythm:   Afib w/ controlled rate  Breath sounds: Fairly clear  Heart sounds:  irregular  Incisions:  Clean and dry  Abdomen:  Soft, non-distended, non-tender, diarrhea decreased  Extremities:  Warm, well-perfused  Chest tubes:  Low volume thin serosanguinous output, no air leak    Intake/Output from previous day: 06/01 0701 - 06/02 0700 In: 3300 [I.V.:455; ZW/CH:8527; IV Piggyback:600] Out: 7824 [MPNTI:1443; Chest Tube:456] Intake/Output this shift:    Lab Results:  CBC: Recent Labs  01/31/15 0400 02/01/15 0400  WBC 8.1 8.2  HGB 10.3* 10.3*  HCT 33.6* 34.4*  PLT 117* 150    BMET:  Recent Labs  01/31/15 0400 02/01/15 0400  NA 141 144  K 3.6 3.3*  CL 101 100*  CO2 32 34*  GLUCOSE 92 104*  BUN 38* 35*  CREATININE 1.01 0.90  CALCIUM 7.7* 7.7*     CBG (last 3)   Recent Labs  01/31/15 1920 01/31/15 2337 02/01/15 0348  GLUCAP 89 104* 109*    ABG    Component Value Date/Time   PHART 7.224* 01/30/2015 1521   PCO2ART 75.5* 01/30/2015 1521   PO2ART 127.0* 01/30/2015 1521   HCO3 30.7* 01/30/2015 1521   TCO2 33 01/30/2015 1521   ACIDBASEDEF 1.0 01/26/2015 0425   O2SAT 70.1 01/31/2015 0420    CXR: Looks remarkably clear  Assessment/Plan: S/P Procedure(s)  (LRB): TRANSESOPHAGEAL ECHOCARDIOGRAM (TEE) (N/A) Re-Operation, MINIMALLY INVASIVE MITRAL VALVE (MV) REPLACEMENT. (Right)  RESP:VDRF due to ARDS, acute lung injury, acute exacerbation of chronic diastolic CHF and possible HCAP complicating chronic respiratory failure due to chronic diastolic CHF, OSA, chronic pulmonary hypertension. Oxygenation and gas exchange good. CXR looks really good. Overall much improved and secretions reportedly decreased. Possible SBT today? - plan per Pulm/CCM team  [id number] hemodynamically stable in chronic permanent rate-controlled Afib. Off all pressors and inotropic agents, on low dose metoprolol and digoxin for rate control. Acute exacerbation of chronic diastolic CHF, diuresing remarkably well on lasix drip. CVP down to 7.   [id number] HCAP on empiric Vanc + Aztreonam. Resp culture 5/28 reported as "no growth". Low grade fevers persist but trending down.  Patient has been + for C. Diff enterocolitis but follow up negative. WBC remains normal. Abdominal exam benign.Diarrhea decreased. Started on Flagyl via tube 5/31.  RENAL:Diuresing well last 72 hours on lasix drip I/O's negative 4 liters yesterday. BUN/Creatinine stable 35/0.9 today. Weight trending down and now below pre-op baseline. Continue lasix drip today.  HEME:Expected post op acute blood loss anemia, Hgb stable 10.3. Post op thrombocytopenia resolved, platelet count up to 150k. INR subtherapeutic on coumadin but trending up - continue increased dose today. Avoiding heparin/lovenox due to concerns of possible HIT  IWP:[id number]  tube feeds. Electrolytes stable. Diuretic-induced hypokalemia - replace  ENDO:Type II diabetes mellitus, excellent glycemic control on levemir + SSI  NEURO:Intact. Lightly sedated on vent. Comfortable  DISP:Anticipate full recovery  w/ chronic respiratory failure remaining primary limitation.   Lance Cook 02/01/2015 8:03 AM

## 2015-02-01 NOTE — Progress Notes (Signed)
Upon patient assessment arterial line was removed by patient. No bleeding. Site clean dry intact, added dressing on site. E-link called and told if they want a new one placed they will put an order. Will continue to monitor.  Sandre Kitty

## 2015-02-01 NOTE — Care Management Note (Signed)
Case Management Note  Patient Details  Name: Lance Cook MRN: [id number] Date of Birth: 05-30-1961  Subjective/Objective:                    Action/Plan:   Expected Discharge Date:                  Expected Discharge Plan:  Skilled Nursing Facility  In-House Referral:  Clinical Social Work  Discharge planning Services  CM Consult  Post Acute Care Choice:    Choice offered to:     DME Arranged:    DME Agency:     HH Arranged:    [place] Agency:     Status of Service:  In process, will continue to follow  Medicare Important Message Given:    Date Medicare IM Given:    Medicare IM give by:    Date Additional Medicare IM Given:    Additional Medicare Important Message give by:     If discussed at [place] of Stay Meetings, dates discussed:  02/01/2015  Additional Comments:  Delrae Sawyers, RN 02/01/2015, 8:58 AM

## 2015-02-01 NOTE — Progress Notes (Signed)
PULMONARY / CRITICAL CARE MEDICINE   Name: Alexandra Posadas MRN: [id number] DOB: 1961/04/24    ADMISSION DATE:  01/24/2015 CONSULTATION DATE:  02/01/2015  REFERRING MD :  Roxy Manns  CHIEF COMPLAINT:  Hypoxemia post op  INITIAL PRESENTATION: 54 y.o. M with extensive cardiac hx including severe MR.  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 Roxy Manns via rt thoracotomy.  Following procedure, remained hypoxemic and had edema vs ARDS on CXR.  PCCM consulted for recs. Long pump time. 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 Tolerates PS 14 cm H2O. Vigorous diuresis on furosemide gtt  VITAL SIGNS: Temp:  [97.9 F (36.6 C)-101.8 F (38.8 C)] 99.2 F (37.3 C) (06/02 1100) Pulse Rate:  [82-106] 93 (06/02 1200) Resp:  [20-26] 24 (06/02 1200) BP: (81-133)/(41-72) 106/60 mmHg (06/02 1200) SpO2:  [98 %-100 %] 98 % (06/02 1200) Arterial Line BP: (105-135)/(45-62) 107/47 mmHg (06/02 1200) FiO2 (%):  [40 %] 40 % (06/02 1200) Weight:  [110.5 kg (243 lb 9.7 oz)] 110.5 kg (243 lb 9.7 oz) (06/02 0500) HEMODYNAMICS: CVP:  [3 mmHg-11 mmHg] 11 mmHg VENTILATOR SETTINGS: Vent Mode:  [-] CPAP FiO2 (%):  [40 %] 40 % Set Rate:  [20 bmp] 20 bmp Vt Set:  [450 mL] 450 mL PEEP:  [5 cmH20] 5 cmH20 Pressure Support:  [14 cmH20-16 cmH20] 14 cmH20 Plateau Pressure:  [20 cmH20-22 cmH20] 20 cmH20 INTAKE / OUTPUT: Intake/Output      06/01 0701 - 06/02 0700 06/02 0701 - 06/03 0700   I.V. (mL/kg) 455 (4.1) 62.5 (0.6)   NG/GT 2245 385   IV Piggyback 600 50   Total Intake(mL/kg) 3300 (29.9) 497.5 (4.5)   Urine (mL/kg/hr) 6945 (2.6) 750 (1.3)   Emesis/NG output     Chest Tube 456 (0.2) 110 (0.2)   Total Output 7401 860   Net -4101 -362.5          PHYSICAL EXAMINATION: General: NAD, RASS 0, + F/C Neuro: no focal deficits HEENT: WNL Cardiovascular: Reg, no M Lungs: no wheezes Abdomen: obese, soft, NT Ext: much improved symmetric edema  LABS:  CBC  Recent  Labs Lab 01/30/15 0430 01/31/15 0400 02/01/15 0400  WBC 7.1 8.1 8.2  HGB 10.3* 10.3* 10.3*  HCT 33.3* 33.6* 34.4*  PLT 85* 117* 150   Coag's  Recent Labs Lab 01/30/15 0430 01/31/15 0400 02/01/15 0400  INR 1.66* 1.56* 1.63*   BMET  Recent Labs Lab 01/30/15 0430 01/31/15 0400 02/01/15 0400  NA 138 141 144  K 3.3* 3.6 3.3*  CL 102 101 100*  CO2 29 32 34*  BUN 32* 38* 35*  CREATININE 0.94 1.01 0.90  GLUCOSE 135* 92 104*   Electrolytes  Recent Labs Lab 01/25/15 1530  01/30/15 0430 01/31/15 0400 02/01/15 0400  CALCIUM  --   < > 7.3* 7.7* 7.7*  MG 2.1  --   --   --   --   < > = values in this interval not displayed. Sepsis Markers  Recent Labs Lab 02/01/15 0400  PROCALCITON 0.22   ABG  Recent Labs Lab 01/25/15 1536 01/26/15 0425 01/30/15 1521  PHART 7.311* 7.327* 7.224*  PCO2ART 47.2* 49.1* 75.5*  PO2ART 93.0 144.0* 127.0*   Liver Enzymes  Recent Labs Lab 01/26/15 0420  AST 123*  ALT 40  ALKPHOS 41  BILITOT 1.2  ALBUMIN 2.1*   Cardiac Enzymes No results for input(s): TROPONINI, PROBNP in the last  168 hours. 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/24/15 H/o CAD, HTN, HLD Chronic dCHF Hx PAF P:  HD management per TCTS  RENAL A:   AKI, resolved Hypokalemia Severe hypervolemia - improving  P:   Monitor BMET intermittently Monitor I/Os Correct electrolytes as indicated Cont furosemide gtt per TCTS Supplement  K+  GASTROINTESTINAL A:   GERD P:   SUP: enteral PPI Cont TFs  HEMATOLOGIC A:   ICU acquired anemia P:  DVT px: warfarin (mech valve) Monitor CBC intermittently Transfuse per usual ICU guidelines   INFECTIOUS A:   Possible HCAP Low PCT 6/02 argues against active bacterial infection C diff colitis P:   Aztreonam start date 5/27 >> 6/02 Vancomycin, start date 5/25 >> 6/03 Metronidazole 5/31 >>  Complete 14 days rx for uncomplicated C diff  ENDOCRINE A:   DM2, controlled P:   Cont SSI  NEUROLOGIC A:   Acute metabolic encephalopathy, resolved P:   RASS goal: 0 Cont current meds   Merton Border, MD ; [facility] service Mobile [phone].  After 5:30 PM or weekends, call [phone]

## 2015-02-02 ENCOUNTER — Inpatient Hospital Stay (HOSPITAL_COMMUNITY): Payer: Medicare HMO

## 2015-02-02 LAB — GLUCOSE, CAPILLARY
Glucose-Capillary: 107 mg/dL — ABNORMAL HIGH (ref 65–99)
Glucose-Capillary: 127 mg/dL — ABNORMAL HIGH (ref 65–99)
Glucose-Capillary: 102 mg/dL — ABNORMAL HIGH (ref 65–99)
Glucose-Capillary: 102 mg/dL — ABNORMAL HIGH (ref 65–99)
Glucose-Capillary: 97 mg/dL (ref 65–99)

## 2015-02-02 LAB — BASIC METABOLIC PANEL
ANION GAP: 10 (ref 5–15)
BUN: 36 mg/dL — ABNORMAL HIGH (ref 6–20)
CALCIUM: 8.2 mg/dL — AB (ref 8.9–10.3)
CO2: 35 mmol/L — AB (ref 22–32)
Chloride: 102 mmol/L (ref 101–111)
Creatinine, Ser: 0.9 mg/dL (ref 0.61–1.24)
GFR calc Af Amer: >60 mL/min (ref >60)
GFR calc non Af Amer: >60 mL/min (ref >60)
Glucose, Bld: 93 mg/dL (ref 65–99)
Potassium: 3.6 mmol/L (ref 3.5–5.1)
SODIUM: 147 mmol/L — AB (ref 135–145)

## 2015-02-02 LAB — PROTIME-INR
INR: 1.7 — ABNORMAL HIGH (ref 0.00–1.49)
PROTHROMBIN TIME: 20 s — AB (ref 11.6–15.2)

## 2015-02-02 MED ORDER — FENTANYL CITRATE (PF) 100 MCG/2ML IJ SOLN
12.5000 ug | INTRAMUSCULAR | Status: DC | PRN
  Administered 2015-02-03 – 2015-02-08 (×4): 25 ug via INTRAVENOUS
  Filled 2015-02-02 (×4): qty 2

## 2015-02-02 MED ORDER — POTASSIUM CHLORIDE 10 MEQ/50ML IV SOLN
10.0000 meq | INTRAVENOUS | Status: AC
  Administered 2015-02-02 (×2): 10 meq via INTRAVENOUS

## 2015-02-02 MED ORDER — WARFARIN SODIUM 7.5 MG PO TABS
7.5000 mg | ORAL_TABLET | Freq: Once | ORAL | Status: AC
  Administered 2015-02-02: 7.5 mg via ORAL
  Filled 2015-02-02: qty 1

## 2015-02-02 MED ORDER — METOPROLOL TARTRATE 12.5 MG HALF TABLET
12.5000 mg | ORAL_TABLET | Freq: Two times a day (BID) | ORAL | Status: DC
  Administered 2015-02-02 (×2): 12.5 mg via ORAL
  Filled 2015-02-02 (×4): qty 1

## 2015-02-02 MED ORDER — POTASSIUM CHLORIDE 10 MEQ/50ML IV SOLN
10.0000 meq | INTRAVENOUS | Status: AC
  Administered 2015-02-02 (×4): 10 meq via INTRAVENOUS
  Filled 2015-02-02 (×3): qty 50

## 2015-02-02 MED ORDER — METRONIDAZOLE 50 MG/ML ORAL SUSPENSION
500.0000 mg | Freq: Three times a day (TID) | ORAL | Status: DC
  Administered 2015-02-02 – 2015-02-04 (×6): 500 mg via ORAL
  Filled 2015-02-02 (×9): qty 10

## 2015-02-02 MED ORDER — PANTOPRAZOLE SODIUM 40 MG PO TBEC
40.0000 mg | DELAYED_RELEASE_TABLET | Freq: Every day | ORAL | Status: DC
  Administered 2015-02-02: 40 mg via ORAL
  Filled 2015-02-02: qty 1

## 2015-02-02 MED ORDER — DIGOXIN 250 MCG PO TABS
0.2500 mg | ORAL_TABLET | Freq: Every day | ORAL | Status: DC
  Administered 2015-02-02 – 2015-02-08 (×7): 0.25 mg via ORAL
  Filled 2015-02-02 (×7): qty 1

## 2015-02-02 NOTE — Progress Notes (Signed)
TCTS BRIEF SICU PROGRESS NOTE  9 Days Post-Op  S/P Procedure(s) (LRB): TRANSESOPHAGEAL ECHOCARDIOGRAM (TEE) (N/A) Re-Operation, MINIMALLY INVASIVE MITRAL VALVE (MV) REPLACEMENT. (Right)   Looks great Voice stronger Sat up in chair all afternoon Afib w/ controlled rate O2 sats 95-99% on 6 L/min via [place] Diuresing very well  Plan: Continue current plan  Rexene Alberts 02/02/2015 6:22 PM

## 2015-02-02 NOTE — Progress Notes (Signed)
Rehab Admissions Coordinator Note:  Patient was screened by Cleatrice Burke for appropriateness for an Inpatient Acute Rehab Consult per PT recommendation  At this time, we are recommending Inpatient Rehab consult and OT eval. Please place order.  Cleatrice Burke 02/02/2015, 6:18 PM  I can be reached at [phone].

## 2015-02-02 NOTE — Progress Notes (Addendum)
[address]       [place] [address]             [phone]        CARDIOTHORACIC SURGERY PROGRESS NOTE   R9 Days Post-Op Procedure(s) (LRB): TRANSESOPHAGEAL ECHOCARDIOGRAM (TEE) (N/A) Re-Operation, MINIMALLY INVASIVE MITRAL VALVE (MV) REPLACEMENT. (Right)  Subjective: Extubated to BiPAP this morning.  Looks and feels comfortable.  Denies pain.  Objective: Vital signs: BP Readings from Last 1 Encounters:  02/02/15 125/73   Pulse Readings from Last 1 Encounters:  02/02/15 129   Resp Readings from Last 1 Encounters:  02/02/15 28   Temp Readings from Last 1 Encounters:  02/02/15 101 F (38.3 C) Axillary    Hemodynamics: CVP:  [5 mmHg-18 mmHg] 18 mmHg  Physical Exam:  Rhythm:   Afib w/ HR 100-110  Breath sounds: Fairly clear  Heart sounds:  irregular  Incisions:  Clean and dry  Abdomen:  Soft, non-distended, non-tender  Extremities:  Warm, well-perfused  Chest tubes:  Low volume thin serosanguinous output, no air leak    Intake/Output from previous day: 06/02 0701 - 06/03 0700 In: 2392.5 [I.V.:687.5; GU/YQ:0347; IV Piggyback:50] Out: 4259 [Urine:5700; Stool:200; Chest Tube:330] Intake/Output this shift: Total I/O In: 214.3 [I.V.:34.3; NG/GT:130; IV Piggyback:50] Out: 570 [Urine:550; Chest Tube:20]  Lab Results:  CBC: Recent Labs  01/31/15 0400 02/01/15 0400  WBC 8.1 8.2  HGB 10.3* 10.3*  HCT 33.6* 34.4*  PLT 117* 150    BMET:  Recent Labs  02/01/15 0400 02/02/15 0525  NA 144 147*  K 3.3* 3.6  CL 100* 102  CO2 34* 35*  GLUCOSE 104* 93  BUN 35* 36*  CREATININE 0.90 0.90  CALCIUM 7.7* 8.2*     CBG (last 3)   Recent Labs  02/01/15 2316 02/02/15 0347 02/02/15 0801  GLUCAP 103* 107* 127*    ABG    Component Value Date/Time   PHART 7.224* 01/30/2015 1521   PCO2ART 75.5* 01/30/2015 1521   PO2ART 127.0* 01/30/2015 1521   HCO3 30.7* 01/30/2015 1521   TCO2 33 01/30/2015 1521   ACIDBASEDEF 1.0 01/26/2015  0425   O2SAT 70.1 01/31/2015 0420    CXR: PORTABLE CHEST - 1 VIEW  COMPARISON: 02/01/2015.  FINDINGS: 0700 hrs. The cardio pericardial silhouette is enlarged. Endotracheal tube tip is 3.2 cm above the base of the carina. The NG tube passes into the stomach although the distal tip position is not included on the film. Two right chest tubes remain in place without evidence for right-sided pneumothorax. Pulmonary vascular congestion noted with interstitial pulmonary edema Right PICC line tip projects at the distal SVC level. Telemetry leads overlie the chest.  IMPRESSION: No substantial interval change in exam. Enlargement of the cardiopericardial silhouette with vascular congestion and interstitial pulmonary edema.  No evidence for pneumothorax.   Electronically Signed  By: Misty Stanley M.D.  On: 02/02/2015 07:34  Assessment/Plan: S/P Procedure(s) (LRB): TRANSESOPHAGEAL ECHOCARDIOGRAM (TEE) (N/A) Re-Operation, MINIMALLY INVASIVE MITRAL VALVE (MV) REPLACEMENT. (Right)  RESP:Respiratory failure due to ARDS, acute lung injury, acute exacerbation of chronic diastolic CHF and possible HCAP complicating chronic respiratory failure due to chronic diastolic CHF, OSA, chronic pulmonary hypertension. Extubated uneventfully this morning.  Monitor closely and begin to mobilize out of bed  DG:LOVFIEP hemodynamically stable in chronic permanent rate-controlled Afib. Continue low dose metoprolol and digoxin for rate control. Acute exacerbation of chronic diastolic CHF, diuresing remarkably well on lasix drip.    ID:Low grade fevers persist. Patient has  been + for C. Diff enterocolitis but follow up negative. WBC normal yesterday. Abdominal exam benign.Diarrhea decreased. Started on Flagyl via tube 5/31. Otherwise now off all antibiotics.  RENAL:Diuresing well on lasix drip I/O's negative 4 liters yesterday.  BUN/Creatinine stable 36/0.9 today, sodium up slightly 147. Weight trending down and now below pre-op baseline. Continue lasix drip today.  HEME:Expected post op acute blood loss anemia. Post op thrombocytopenia resolved, platelet count up to 150k. INR subtherapeutic on coumadin but trending up - continue increased dose today.  FEN:Has been tolerating tube feeds but now just extubated. May need swallowing eval prior to advancing diet.  Electrolytes stable although sodium up slightly.  Diuretic-induced hypokalemia - replace  ENDO:Type II diabetes mellitus, excellent glycemic control on levemir + SSI - will hold levemir now that tube feeds on hold  NEURO:Intact  DISP:Anticipate full recovery w/ chronic respiratory failure remaining primary limitation.  Lance Cook 02/02/2015 9:07 AM

## 2015-02-02 NOTE — Progress Notes (Signed)
Pt taken off BIPAP and placed on 6L [place]. Pt tolerating well at this time. RT will continue to monitor.

## 2015-02-02 NOTE — Procedures (Signed)
Extubation Procedure Note  Patient Details:   Name: Lance Cook DOB: 1961/01/26 MRN: [id number]   Airway Documentation:     Evaluation  O2 sats: stable throughout Complications: No apparent complications Patient did tolerate procedure well. Bilateral Breath Sounds: Diminished Suctioning: Airway Yes   Pt tolerated wean, positive for cuff leak, extubated to BIPAP per MD order. Pt tolerating BIPAP at this time. No stridor or dyspnea noted after extubation. RT will wean off BIPAP as pt tolerates. RT will continue to monitor.   Mariam Dollar 02/02/2015, 9:03 AM

## 2015-02-02 NOTE — Evaluation (Signed)
Physical Therapy Evaluation Patient Details Name: Lance Cook MRN: [id number] DOB: [birth year] Today's Date: 02/02/2015   History of Present Illness  54 y.o. M with extensive cardiac hx including severe MR. 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 Roxy Manns via rt thoracotomy. Following procedure, remained hypoxemic and had edema vs ARDS on CXR. Remained intubated until 6/3. PMHx- morbid obesity, DM, CVA age 43, CHF, aortic dissection, AVR, OSA  Clinical Impression  Patient is s/p above surgery resulting in functional limitations due to the deficits listed below (see PT Problem List). Pt currently confused and wife not present to discuss d/c plans. Appears pt had limited endurance PTA and likely will make slow progress. Patient will benefit from skilled PT to increase their independence and safety with mobility to allow discharge to the venue listed below.       Follow Up Recommendations CIR;Supervision/Assistance - 24 hour    Equipment Recommendations   (TBA)    Recommendations for Other Services OT consult;Rehab consult     Precautions / Restrictions Precautions Precautions: Fall      Mobility  Bed Mobility Overal bed mobility: Needs Assistance;+2 for physical assistance;+ 2 for safety/equipment Bed Mobility: Supine to Sit     Supine to sit: Mod assist;+2 for physical assistance;+2 for safety/equipment;HOB elevated     General bed mobility comments: HOB to maintain respiratory status; pt required assist with legs over EOB and bringing hips to sit at edge (pt was helping)  Transfers Overall transfer level: Needs assistance Equipment used: 2 person hand held assist Transfers: Sit to/from [facility] Sit to Stand: Min assist;+2 physical assistance;+2 safety/equipment Stand pivot transfers: Mod assist;+2 physical assistance;+2 safety/equipment       General transfer comment: as began to step-pivot to chair, pt with increasing posterior lean. He attempted to  compensate with hip flexion and bringing torso forward, making stepping more difficult  Ambulation/Gait                Stairs            Wheelchair Mobility    Modified Rankin (Stroke Patients Only)       Balance Overall balance assessment: Needs assistance Sitting-balance support: No upper extremity supported;Feet unsupported Sitting balance-Leahy Scale: Poor Sitting balance - Comments: minguard assist jue to slight post lean Postural control: Posterior lean Standing balance support: Bilateral upper extremity supported Standing balance-Leahy Scale: Poor                               Pertinent Vitals/Pain SaO2 99% on 6L throughout Able to talk throughout session  Pain Assessment: No/denies pain    Home Living Family/patient expects to be discharged to:: Private residence Living Arrangements: Spouse/significant other               Additional Comments: pt too confused to complete; no family present    Prior Function Level of Independence: Needs assistance   Gait / Transfers Assistance Needed: per pt walked household distances only; used motorized cart at the store     Comments: pt confused and ?accuracy     Hand Dominance        Extremity/Trunk Assessment   Upper Extremity Assessment: Generalized weakness           Lower Extremity Assessment: Generalized weakness;RLE deficits/detail;LLE deficits/detail RLE Deficits / Details: strength hips and knees grossly 3+ to 4; ankle DF 5/5; hemosiderin staining lower leg LLE Deficits / Details: strength hips and  knees grossly 3+ to 4; ankle DF 5/5; hemosiderin staining lower leg  Cervical / Trunk Assessment: Normal  Communication   Communication: Expressive difficulties (low volume s/p extubation; word finding)  Cognition Arousal/Alertness: Awake/alert Behavior During Therapy: WFL for tasks assessed/performed Overall Cognitive Status: No family/caregiver present to determine baseline  cognitive functioning                      General Comments      Exercises        Assessment/Plan    PT Assessment Patient needs continued PT services  PT Diagnosis Difficulty walking;Generalized weakness   PT Problem List Decreased strength;Decreased activity tolerance;Decreased balance;Decreased mobility;Decreased cognition;Decreased knowledge of use of DME;Decreased safety awareness;Cardiopulmonary status limiting activity;Obesity  PT Treatment Interventions DME instruction;Gait training;Stair training;Functional mobility training;Therapeutic activities;Therapeutic exercise;Balance training;Cognitive remediation;Patient/family education   PT Goals (Current goals can be found in the Care Plan section) Acute Rehab PT Goals Patient Stated Goal: to get better and go home PT Goal Formulation: With patient Time For Goal Achievement: 02/16/15 Potential to Achieve Goals: Good    Frequency Min 3X/week   Barriers to discharge        Co-evaluation               End of Session Equipment Utilized During Treatment: Oxygen Activity Tolerance: Patient tolerated treatment well Patient left: in chair;with call bell/phone within reach Nurse Communication: Mobility status (confused language)         Time: 0712-1975 PT Time Calculation (min) (ACUTE ONLY): 29 min   Charges:   PT Evaluation $Initial PT Evaluation Tier I: 1 Procedure PT Treatments $Therapeutic Activity: 8-22 mins   PT G Codes:        Gift Rueckert Feb 24, 2015, 3:24 PM Pager [phone]

## 2015-02-02 NOTE — Progress Notes (Signed)
PULMONARY / CRITICAL CARE MEDICINE   Name: Lance Cook MRN: [id number] DOB: [birth year]    ADMISSION DATE:  01/24/2015 CONSULTATION DATE:  02/02/2015  REFERRING MD :  Roxy Manns  CHIEF COMPLAINT:  Hypoxemia post op  INITIAL PRESENTATION: 54 y.o. M with extensive cardiac hx including severe MR.  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 Roxy Manns via rt thoracotomy.  Following procedure, remained hypoxemic and had edema vs ARDS on CXR.  PCCM consulted for recs. Long pump time. 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 Extubated to PRN BiPAP and tolerating  VITAL SIGNS: Temp:  [99.5 F (37.5 C)-101.5 F (38.6 C)] 99.5 F (37.5 C) (06/03 1621) Pulse Rate:  [71-129] 124 (06/03 1400) Resp:  [19-39] 39 (06/03 1400) BP: (92-139)/(51-99) 133/73 mmHg (06/03 1400) SpO2:  [94 %-100 %] 94 % (06/03 1400) Arterial Line BP: (132)/(51) 132/51 mmHg (06/02 1800) FiO2 (%):  [40 %] 40 % (06/03 0835) Weight:  [106.6 kg (235 lb 0.2 oz)] 106.6 kg (235 lb 0.2 oz) (06/03 0500) HEMODYNAMICS: CVP:  [5 mmHg-18 mmHg] 6 mmHg VENTILATOR SETTINGS: Vent Mode:  [-] BIPAP FiO2 (%):  [40 %] 40 % Set Rate:  [15 bmp-20 bmp] 15 bmp Vt Set:  [450 mL] 450 mL PEEP:  [5 cmH20] 5 cmH20 Plateau Pressure:  [19 cmH20-20 cmH20] 20 cmH20 INTAKE / OUTPUT: Intake/Output      06/02 0701 - 06/03 0700 06/03 0701 - 06/04 0700   I.V. (mL/kg) 687.5 (6.4) 74.3 (0.7)   NG/GT 1655 130   IV Piggyback 50 200   Total Intake(mL/kg) 2392.5 (22.4) 404.3 (3.8)   Urine (mL/kg/hr) 5700 (2.2) 3125 (2.8)   Stool 200 (0.1)    Chest Tube 330 (0.1) 290 (0.3)   Total Output 6230 3415   Net -3837.5 -3010.7          PHYSICAL EXAMINATION: General: NAD, RASS 0, + F/C Neuro: no focal deficits HEENT: WNL Cardiovascular: Reg, no M Lungs: no wheezes Abdomen: obese, soft, NT Ext: much improved symmetric edema  LABS:  CBC  Recent Labs Lab 01/30/15 0430 01/31/15 0400 02/01/15 0400   WBC 7.1 8.1 8.2  HGB 10.3* 10.3* 10.3*  HCT 33.3* 33.6* 34.4*  PLT 85* 117* 150   Coag's  Recent Labs Lab 01/31/15 0400 02/01/15 0400 02/02/15 0525  INR 1.56* 1.63* 1.70*   BMET  Recent Labs Lab 01/31/15 0400 02/01/15 0400 02/02/15 0525  NA 141 144 147*  K 3.6 3.3* 3.6  CL 101 100* 102  CO2 32 34* 35*  BUN 38* 35* 36*  CREATININE 1.01 0.90 0.90  GLUCOSE 92 104* 93   Electrolytes  Recent Labs Lab 01/31/15 0400 02/01/15 0400 02/02/15 0525  CALCIUM 7.7* 7.7* 8.2*   Sepsis Markers  Recent Labs Lab 02/01/15 0400  PROCALCITON 0.22   ABG  Recent Labs Lab 01/30/15 1521  PHART 7.224*  PCO2ART 75.5*  PO2ART 127.0*   Liver Enzymes No results for input(s): AST, ALT, ALKPHOS, BILITOT, ALBUMIN in the last 168 hours. Cardiac Enzymes No results for input(s): TROPONINI, PROBNP in the last 168 hours. 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 H/o CAD, HTN, HLD Chronic dCHF Hx PAF P:  Hemodynamic mgmt per TCTS  RENAL A:   AKI, resolved Hypokalemia Severe hypervolemia - improving  P:   Monitor BMET intermittently Monitor I/Os Correct electrolytes as indicated Furosemide gtt per TCTS Supplement K+  GASTROINTESTINAL A:   GERD Post extubation dysphagia P:   SUP: N/I post ext NPO post ext SLP eval 6/04  HEMATOLOGIC A:   ICU acquired anemia P:  DVT px: warfarin (mech valve) Monitor CBC intermittently Transfuse per usual ICU guidelines  INFECTIOUS A:   C diff colitis P:   Aztreonam  start date 5/27 >> 6/02 Vancomycin, start date 5/25 >> 6/03 Metronidazole 5/31 >>  Complete 14 days rx for uncomplicated C diff  ENDOCRINE A:   DM2, controlled P:   Cont SSI  NEUROLOGIC A:   Acute metabolic encephalopathy, resolved P:   RASS goal: 0 Cont current meds   Merton Border, MD ; [facility] service Mobile [phone].  After 5:30 PM or weekends, call [phone]

## 2015-02-03 ENCOUNTER — Inpatient Hospital Stay (HOSPITAL_COMMUNITY): Payer: Medicare HMO

## 2015-02-03 DIAGNOSIS — A047 Enterocolitis due to Clostridium difficile: Secondary | ICD-10-CM

## 2015-02-03 LAB — GLUCOSE, CAPILLARY
GLUCOSE-CAPILLARY: 88 mg/dL (ref 65–99)
GLUCOSE-CAPILLARY: 97 mg/dL (ref 65–99)
Glucose-Capillary: 98 mg/dL (ref 65–99)
Glucose-Capillary: 87 mg/dL (ref 65–99)
Glucose-Capillary: 99 mg/dL (ref 65–99)

## 2015-02-03 LAB — CBC
HEMATOCRIT: 39.6 % (ref 39.0–52.0)
Hemoglobin: 11.5 g/dL — ABNORMAL LOW (ref 13.0–17.0)
MCH: 25.8 pg — AB (ref 26.0–34.0)
MCHC: 29 g/dL — ABNORMAL LOW (ref 30.0–36.0)
MCV: 89 fL (ref 78.0–100.0)
PLATELETS: 224 10*3/uL (ref 150–400)
RBC: 4.45 MIL/uL (ref 4.22–5.81)
RDW: 18.1 % — ABNORMAL HIGH (ref 11.5–15.5)
WBC: 11.4 10*3/uL — ABNORMAL HIGH (ref 4.0–10.5)

## 2015-02-03 LAB — BASIC METABOLIC PANEL
Anion gap: 13 (ref 5–15)
BUN: 31 mg/dL — AB (ref 6–20)
CALCIUM: 8.8 mg/dL — AB (ref 8.9–10.3)
CHLORIDE: 98 mmol/L — AB (ref 101–111)
CO2: 40 mmol/L — AB (ref 22–32)
Creatinine, Ser: 0.98 mg/dL (ref 0.61–1.24)
GFR calc non Af Amer: >60 mL/min (ref >60)
Glucose, Bld: 86 mg/dL (ref 65–99)
Potassium: 4 mmol/L (ref 3.5–5.1)
SODIUM: 151 mmol/L — AB (ref 135–145)

## 2015-02-03 LAB — PROTIME-INR
INR: 1.9 — AB (ref 0.00–1.49)
PROTHROMBIN TIME: 21.7 s — AB (ref 11.6–15.2)

## 2015-02-03 MED ORDER — BUDESONIDE 0.25 MG/2ML IN SUSP
0.2500 mg | Freq: Two times a day (BID) | RESPIRATORY_TRACT | Status: DC
  Administered 2015-02-03 – 2015-02-05 (×4): 0.25 mg via RESPIRATORY_TRACT
  Filled 2015-02-03 (×6): qty 2

## 2015-02-03 MED ORDER — BUSPIRONE HCL 15 MG PO TABS
7.5000 mg | ORAL_TABLET | Freq: Two times a day (BID) | ORAL | Status: DC
  Administered 2015-02-03 – 2015-02-08 (×11): 7.5 mg via ORAL
  Filled 2015-02-03 (×13): qty 1

## 2015-02-03 MED ORDER — ALLOPURINOL 300 MG PO TABS
300.0000 mg | ORAL_TABLET | Freq: Every day | ORAL | Status: DC
  Administered 2015-02-03 – 2015-02-08 (×6): 300 mg via ORAL
  Filled 2015-02-03 (×6): qty 1

## 2015-02-03 MED ORDER — INSULIN ASPART 100 UNIT/ML ~~LOC~~ SOLN
0.0000 [IU] | Freq: Three times a day (TID) | SUBCUTANEOUS | Status: DC
  Administered 2015-02-08: 4 [IU] via SUBCUTANEOUS

## 2015-02-03 MED ORDER — ATENOLOL 50 MG PO TABS
50.0000 mg | ORAL_TABLET | Freq: Two times a day (BID) | ORAL | Status: DC
  Administered 2015-02-03 – 2015-02-08 (×8): 50 mg via ORAL
  Filled 2015-02-03 (×12): qty 1

## 2015-02-03 NOTE — Progress Notes (Signed)
PULMONARY / CRITICAL CARE MEDICINE   Name: Lance Cook MRN: [id number] DOB: 03/28/1961    ADMISSION DATE:  01/24/2015 CONSULTATION DATE:  02/03/2015  REFERRING MD :  Roxy Manns  CHIEF COMPLAINT:  Hypoxemia post op  INITIAL PRESENTATION: 54 y.o. M with extensive cardiac hx including severe MR.  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 Roxy Manns via rt thoracotomy.  Following procedure, remained hypoxemic and had edema vs ARDS on CXR.  PCCM consulted for recs. Long pump time. Prior Bentall aortic root replacement in 1999 as well as CABG x 1     SUBJECTIVE: Extubated yesterday to BIPAP, has done well Doesn't like our BIPAP mask Didn't sleep Diuresing  VITAL SIGNS: Temp:  [99.5 F (37.5 C)-101 F (38.3 C)] 100.3 F (37.9 C) (06/04 0400) Pulse Rate:  [89-129] 116 (06/04 0600) Resp:  [14-39] 30 (06/04 0600) BP: (107-141)/(50-99) 121/65 mmHg (06/04 0600) SpO2:  [90 %-100 %] 97 % (06/04 0600) FiO2 (%):  [40 %] 40 % (06/03 0835) Weight:  [100 kg (220 lb 7.4 oz)] 100 kg (220 lb 7.4 oz) (06/04 0600) HEMODYNAMICS: CVP:  [0 mmHg-21 mmHg] 12 mmHg VENTILATOR SETTINGS: Vent Mode:  [-] BIPAP FiO2 (%):  [40 %] 40 % Set Rate:  [15 bmp] 15 bmp PEEP:  [5 cmH20] 5 cmH20 INTAKE / OUTPUT: Intake/Output      06/03 0701 - 06/04 0700 06/04 0701 - 06/05 0700   P.O. 30    I.V. (mL/kg) 354.3 (3.5)    NG/GT 130    IV Piggyback 250    Total Intake(mL/kg) 764.3 (7.6)    Urine (mL/kg/hr) 6160 (2.6)    Stool 100 (0)    Chest Tube 360 (0.2)    Total Output 6620     Net -5855.7            PHYSICAL EXAMINATION: General: comfortable in chair HENT: NCAT OP clear PULM: diminished R, clear left CV: Irreg irreg, mech S1 GI: soft, nontender Derm: edema extremities noted Neuro: Awake, alert, somewhat tangential speech  LABS:  CBC  Recent Labs Lab 01/31/15 0400 02/01/15 0400 02/03/15 0450  WBC 8.1 8.2 11.4*  HGB 10.3* 10.3* 11.5*  HCT 33.6* 34.4* 39.6  PLT 117* 150 224   Coag's  Recent  Labs Lab 02/01/15 0400 02/02/15 0525 02/03/15 0450  INR 1.63* 1.70* 1.90*   BMET  Recent Labs Lab 02/01/15 0400 02/02/15 0525 02/03/15 0450  NA 144 147* 151*  K 3.3* 3.6 4.0  CL 100* 102 98*  CO2 34* 35* 40*  BUN 35* 36* 31*  CREATININE 0.90 0.90 0.98  GLUCOSE 104* 93 86   Electrolytes  Recent Labs Lab 02/01/15 0400 02/02/15 0525 02/03/15 0450  CALCIUM 7.7* 8.2* 8.8*   Sepsis Markers  Recent Labs Lab 02/01/15 0400  PROCALCITON 0.22   ABG  Recent Labs Lab 01/30/15 1521  PHART 7.224*  PCO2ART 75.5*  PO2ART 127.0*   Liver Enzymes No results for input(s): AST, ALT, ALKPHOS, BILITOT, ALBUMIN in the last 168 hours. Cardiac Enzymes No results for input(s): TROPONINI, PROBNP in the last 168 hours. Glucose  Recent Labs Lab 02/02/15 0801 02/02/15 1303 02/02/15 1618 02/02/15 1954 02/03/15 0002 02/03/15 0402  GLUCAP 127* 102* 102* 97 97 88    6/4 CXR images reviewed: improving infiltrates with some persistence of R lung consolidation    ASSESSMENT / PLAN:  PULMONARY ETT 5/25 >> 6/03 A:  Acute hypoxic resp failure > improving Pulm infiltrates (edema at this point) - improving  OSA P:  Maintain in ICU CPAP qHS> OK to use home machine/mask if possible with c.diff precautions Supp O2 to maintain SpO2 > 92 % Airway hygiene - encourage cough Adjust nebulized steroid to bid, will d/c 6/5 if no wheezing  CARDIOVASCULAR A:  Cardiogenic shock, resolved Severe MR - s/p minimally invasive MVR 01/24/15 H/o CAD, HTN, HLD Chronic dCHF Hx PAF P:  Hemodynamic mgmt per TCTS  RENAL A:   AKI, resolved Hypokalemia > resolved Anasarca improving Hypernatremia P:   Monitor BMET intermittently Monitor I/Os Correct electrolytes as indicated Furosemide gtt per TCTS Continue Supplement K+ with K Allow free water intake po  GASTROINTESTINAL A:   GERD Post extubation dysphagia P:   SUP: not indicated at this point, d/c ppi NPO post ext SLP eval  6/04, pending  HEMATOLOGIC A:   ICU acquired anemia > resolving P:  DVT px: warfarin (mech valve) Monitor CBC intermittently Transfuse per usual ICU guidelines  INFECTIOUS A:   C diff colitis P:   Aztreonam start date 5/27 >> 6/02 Vancomycin, start date 5/25 >> 6/03 Metronidazole 5/31 >>  Complete 14 days rx for uncomplicated C diff  ENDOCRINE A:   DM2, controlled P:   Cont SSI  NEUROLOGIC A:   Acute metabolic encephalopathy, resolved P:   RASS goal: 0 Cont current meds   Roselie Awkward, MD [place] [facility] Pager: [phone] Cell: [phone] After 3pm or if no response, call [phone]

## 2015-02-03 NOTE — Progress Notes (Signed)
TCTS BRIEF SICU PROGRESS NOTE  10 Days Post-Op  S/P Procedure(s) (LRB): TRANSESOPHAGEAL ECHOCARDIOGRAM (TEE) (N/A) Re-Operation, MINIMALLY INVASIVE MITRAL VALVE (MV) REPLACEMENT. (Right)   Did well today, up in chair for 6 hours More confused this evening, keeps trying to get up out of bed w/out assistance O2 sats 97-98% on 2 L/min Diuresing well despite stopping lasix drip  Plan: Continue current plan.  May need bedside sitter for safety   Rexene Alberts 02/03/2015 6:33 PM

## 2015-02-03 NOTE — Progress Notes (Addendum)
[address]       [place] [address]             [phone]        CARDIOTHORACIC SURGERY PROGRESS NOTE   R10 Days Post-Op Procedure(s) (LRB): TRANSESOPHAGEAL ECHOCARDIOGRAM (TEE) (N/A) Re-Operation, MINIMALLY INVASIVE MITRAL VALVE (MV) REPLACEMENT. (Right)  Subjective: Looks good.  Denies pain, SOB.  Reportedly passed swallowing eval and tolerating po's.  Objective: Vital signs: BP Readings from Last 1 Encounters:  02/03/15 111/73   Pulse Readings from Last 1 Encounters:  02/03/15 113   Resp Readings from Last 1 Encounters:  02/03/15 24   Temp Readings from Last 1 Encounters:  02/03/15 101.8 F (38.8 C)     Hemodynamics: CVP:  [0 mmHg-21 mmHg] 12 mmHg  Physical Exam:  Rhythm:   Afib  Breath sounds: clear  Heart sounds:  irregular  Incisions:  Clean and dry, mild skin edge necrosis right groin incision  Abdomen:  Soft, non-distended, non-tender  Extremities:  Warm, well-perfused  Chest tubes:  Low volume thin serosanguinous output but still draining, no air leak    Intake/Output from previous day: 06/03 0701 - 06/04 0700 In: 784.3 [P.O.:30; I.V.:374.3; NG/GT:130; IV Piggyback:250] Out: 6620 [Urine:6160; Stool:100; Chest Tube:360] Intake/Output this shift: Total I/O In: 40 [I.V.:40] Out: 270 [Urine:250; Chest Tube:20]  Lab Results:  CBC: Recent Labs  02/01/15 0400 02/03/15 0450  WBC 8.2 11.4*  HGB 10.3* 11.5*  HCT 34.4* 39.6  PLT 150 224    BMET:  Recent Labs  02/02/15 0525 02/03/15 0450  NA 147* 151*  K 3.6 4.0  CL 102 98*  CO2 35* 40*  GLUCOSE 93 86  BUN 36* 31*  CREATININE 0.90 0.98  CALCIUM 8.2* 8.8*     PT/INR:   Recent Labs  02/03/15 0450  LABPROT 21.7*  INR 1.90*    CBG (last 3)   Recent Labs  02/03/15 0002 02/03/15 0402 02/03/15 0820  GLUCAP 97 88 98    ABG    Component Value Date/Time   PHART 7.224* 01/30/2015 1521   PCO2ART 75.5* 01/30/2015 1521   PO2ART 127.0* 01/30/2015 1521     HCO3 30.7* 01/30/2015 1521   TCO2 33 01/30/2015 1521   ACIDBASEDEF 1.0 01/26/2015 0425   O2SAT 70.1 01/31/2015 0420    CXR: PORTABLE CHEST - 1 VIEW  COMPARISON: 02/02/2015  FINDINGS: The heart is enlarged but stable. Stable right-sided chest tube without definite pneumothorax. The right PICC line is stable. The endotracheal tube and NG tube is been removed. The lungs demonstrate slight overall improved aeration with resolving pulmonary edema. No pleural effusions.  IMPRESSION: Removal of the ET and NG tubes. The right chest tube and right PICC line are stable. No pneumothorax.  Improved lung aeration with resolving pulmonary edema.   Electronically Signed  By: Marijo Sanes M.D.  On: 02/03/2015 09:27  Assessment/Plan: S/P Procedure(s) (LRB): TRANSESOPHAGEAL ECHOCARDIOGRAM (TEE) (N/A) Re-Operation, MINIMALLY INVASIVE MITRAL VALVE (MV) REPLACEMENT. (Right)  RESP:Resolving acute respiratory failure due to ARDS, acute lung injury, acute exacerbation of chronic diastolic CHF and possible HCAP complicating chronic respiratory failure due to chronic diastolic CHF, OSA, chronic pulmonary hypertension. Extubated uneventfully yesterday morning and doing quite well. Monitor closely and begin to mobilize out of bed.  Resume home CPAP for sleeping.  Leave pleural tubes until output decreased.  EX:NTZGYFV hemodynamically stable in chronic permanent Afib but HR increased. Will replace low dose metoprolol with atenolol and continue digoxin for rate control. Acute  exacerbation of chronic diastolic CHF, diuresing remarkably well on lasix drip - now well below preop baseline.   ID:Low grade fevers persist. Patient has been + for C. Diff enterocolitis but follow up negative. WBC 11,400. Abdominal exam benign.Diarrhea decreased. Started on Flagyl via tube 5/31. Otherwise now off all other antibiotics.  RENAL:Diuresing  well on lasix drip I/O's negative 6 liters yesterday. BUN/Creatinine stable 31/1.0 today, sodium up to 151 today. Weight trending down and now well below pre-op baseline.  Will stop lasix drip.  HEME:Expected post op acute blood loss anemia. Post op thrombocytopenia resolved, platelet count up to 224k. INR subtherapeutic on coumadin but trending up - continue increased dose today.  FXT:KWIOXBDZ oral diet. Electrolytes stable although sodium up further. Encourage oral intake and stop lasix drip.  ENDO:Type II diabetes mellitus, excellent glycemic control on levemir + SSI - will continue to hold levemir insulin for now and change CBG's/SSI to ac/hs  NEURO:Intact.  Intermittently mildly confused.  DISP:Anticipate full recovery w/ chronic respiratory failure remaining primary limitation.  Potentially a candidate for d/c to inpatient rehab service later this week if he continues to improve.   Lance Cook 02/03/2015 10:10 AM

## 2015-02-03 NOTE — Progress Notes (Signed)
Pt's spouse brought home CPAP for pt use while admitted. Checked CPAP cords for wear and tear, cords appear fine, and CPAP works well. Set up includes CPAP machine, two sets of corrugated tubing, two sets of nasal pillows, and power cord. After machine was checked it was placed back in original carry bag, and tubing and pillows were placed in small blue bag. Oncoming therapist told of home machine. RT will continue to monitor.

## 2015-02-03 NOTE — Evaluation (Signed)
Clinical/Bedside Swallow Evaluation Patient Details  Name: Lance Cook MRN: [id number] Date of Birth: 11/29/1960  Today's Date: 02/03/2015 Time: SLP Start Time (ACUTE ONLY): 0840 SLP Stop Time (ACUTE ONLY): 0855 SLP Time Calculation (min) (ACUTE ONLY): 15 min  Past Medical History:  Past Medical History  Diagnosis Date  . Hyperlipidemia   . Hypertension   . Arthritis   . Leg pain 06/28/2010  . Hiatal hernia   . Atrial fibrillation     chronic persistent  . Reflux 11/06/09  . Myocardial infarction age 25  . Varicose veins   . Obesity   . Peripheral vascular disease   . Gout   . CAD (coronary artery disease)     Old scar inferior wall myoview, 10/2009 EF 52%  . Chronic LBP 10/08/2011  . Impaired glucose tolerance 10/08/2011  . Erectile dysfunction 10/08/2011  . Testicular cancer   . Bell's palsy   . TIA (transient ischemic attack)     age 81  . CVA (cerebral infarction)     54yo  . OSA (obstructive sleep apnea)     CPAP  . History of colon polyps   . Anemia   . Hypogonadism male 04/08/2012  . Aortic aneurysm   . Aortic aneurysm and dissection   . 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 Ascending aortic dissection 07/14/2008    Localized dissection of ascending aorta noted on CTA in 2009 and stable on CTA in 2011  . Severe mitral regurgitation 10/11/2014  . Morbid obesity 04/02/2014  . Diabetes mellitus     diet controlled  . DIABETES MELLITUS, TYPE II 11/01/2009    Qualifier: Diagnosis of  By: [name] MD, [name]    . Dysrhythmia   . CHF (congestive heart failure)   . Shortness of breath dyspnea     with exertion  . Anxiety   . GERD (gastroesophageal reflux disease)     not needing medication at thhis time- 01/22/15  . Stroke     TIA and Stroke no residual effect- 1st was 72  . Chronic diastolic congestive heart failure   . S/P  minimally invasive mitral valve  replacement with metallic valve 6/64/4034    33 mm St Jude bileaflet mechanical valve placed via right mini thoracotomy approach   Past Surgical History:  Past Surgical History  Procedure Laterality Date  . Laser ablation  03/06/2010    left leg  . Cardiac valve replacement      [id number]  . Cholecystectomy  2011  . Tonsillectomy  1967  . Otoplasty      bilateral, age 45  . Orchiectomy  age 47    testicular cancer  . Tee without cardioversion N/A 10/11/2014    Procedure: TRANSESOPHAGEAL ECHOCARDIOGRAM (TEE);  Surgeon: Pixie Casino, MD;  [facility];  Service: Cardiovascular;  Laterality: N/ADeneen Harts procedure  1988    25 mm St Jude mechanical valve conduit - Dr Blase Mess at [facility] in [place], [place]  . Coronary artery bypass graft    . Aortic truck    . Tee without cardioversion N/A 01/24/2015    Procedure: TRANSESOPHAGEAL ECHOCARDIOGRAM (TEE);  Surgeon: Rexene Alberts, MD;  Location: [place];  Service: Open Heart Surgery;  Laterality: N/A;  . Mitral valve replacement Right 01/24/2015    Procedure: Re-Operation, MINIMALLY INVASIVE MITRAL VALVE (MV) REPLACEMENT.;  Surgeon: Valentina Gu  Roxy Manns, MD;  Location: [facility];  Service: Open Heart Surgery;  Laterality: Right;   HPI:  Pt is a 54 y.o. male with PMH of obesity, aortic root replacement, referred for possible surgical tx of severe symptomatic mitral regurgitation, chronic sleep apnea/ CPAP-dependent. Pt remained hypoxic following invasive MVR. Intubated 5/25-6/3. Bedside swallow eval ordered to evaluate swallow function post-prolonged intubation.   Assessment / Plan / Recommendation Clinical Impression  Pt demonstrated no overt s/s of aspiration- swallow appeared timely with adequate hyolaryngeal excursion upon palpation. Vocal quality is judged to be normal. Pt taking large bites/ sips- encouraged to slow down; given pt's 9-day intubation and respiratory status, pt may be at an increased risk of aspiration at  this time. Recommend initiating regular diet, thin liquids, meds whole with liquid, intermittent supervision to cue for small bites/ sips and ensure pt is seated upright. SLP will sign off at this time. Please re-consult if needs arise.    Aspiration Risk   (mild-moderate)    Diet Recommendation Age appropriate regular solids;Thin   Medication Administration: Whole meds with liquid Compensations: Slow rate;Small sips/bites    Other  Recommendations Oral Care Recommendations: Oral care BID   Follow Up Recommendations       Frequency and Duration        Pertinent Vitals/Pain None stated    SLP Swallow Goals     Swallow Study Prior Functional Status       General Other Pertinent Information: Pt is a 53 y.o. male with PMH of obesity, aortic root replacement, referred for possible surgical tx of severe symptomatic mitral regurgitation, chronic sleep apnea/ CPAP-dependent. Pt remained hypoxic following invasive MVR. Intubated 5/25-6/3. Bedside swallow eval ordered to evaluate swallow function post-prolonged intubation. Type of Study: Bedside swallow evaluation Diet Prior to this Study: NPO Temperature Spikes Noted: Yes Respiratory Status: Supplemental O2 delivered via (comment) History of Recent Intubation: Yes Length of Intubations (days): 9 days Date extubated: 02/02/15 Behavior/Cognition: Alert;Cooperative Oral Cavity - Dentition: Adequate natural dentition/normal for age Self-Feeding Abilities: Able to feed self Patient Positioning: Upright in chair/Tumbleform Baseline Vocal Quality: Normal Volitional Cough: Strong Volitional Swallow: Able to elicit    Oral/Motor/Sensory Function Overall Oral Motor/Sensory Function: Appears within functional limits for tasks assessed Labial ROM: Within Functional Limits Labial Symmetry: Within Functional Limits Labial Strength: Within Functional Limits Labial Sensation: Within Functional Limits Lingual ROM: Within Functional  Limits Lingual Symmetry: Within Functional Limits Lingual Strength: Within Functional Limits Lingual Sensation: Within Functional Limits   Ice Chips Ice chips: Within functional limits Presentation: Spoon   Thin Liquid Thin Liquid: Within functional limits Presentation: Cup;Straw    Nectar Thick Nectar Thick Liquid: Not tested   Honey Thick Honey Thick Liquid: Not tested   Puree Puree: Within functional limits Presentation: Self Fed;Spoon   Solid   GO    Solid: Within functional limits Presentation: Self Patton Salles, Amy K, MA, CCC-SLP 02/03/2015,9:04 AM

## 2015-02-04 DIAGNOSIS — I481 Persistent atrial fibrillation: Secondary | ICD-10-CM

## 2015-02-04 LAB — BASIC METABOLIC PANEL
ANION GAP: 8 (ref 5–15)
BUN: 28 mg/dL — ABNORMAL HIGH (ref 6–20)
CHLORIDE: 93 mmol/L — AB (ref 101–111)
CO2: 38 mmol/L — AB (ref 22–32)
CREATININE: 0.91 mg/dL (ref 0.61–1.24)
Calcium: 8 mg/dL — ABNORMAL LOW (ref 8.9–10.3)
GFR calc non Af Amer: >60 mL/min (ref >60)
Glucose, Bld: 144 mg/dL — ABNORMAL HIGH (ref 65–99)
Potassium: 3.2 mmol/L — ABNORMAL LOW (ref 3.5–5.1)
SODIUM: 139 mmol/L (ref 135–145)

## 2015-02-04 LAB — GLUCOSE, CAPILLARY
Glucose-Capillary: 104 mg/dL — ABNORMAL HIGH (ref 65–99)
Glucose-Capillary: 115 mg/dL — ABNORMAL HIGH (ref 65–99)
Glucose-Capillary: 118 mg/dL — ABNORMAL HIGH (ref 65–99)

## 2015-02-04 LAB — PROTIME-INR
INR: 2.07 — ABNORMAL HIGH (ref 0.00–1.49)
PROTHROMBIN TIME: 23.2 s — AB (ref 11.6–15.2)

## 2015-02-04 LAB — BRAIN NATRIURETIC PEPTIDE: B NATRIURETIC PEPTIDE 5: 557.7 pg/mL — AB (ref 0.0–100.0)

## 2015-02-04 MED ORDER — CETYLPYRIDINIUM CHLORIDE 0.05 % MT LIQD
7.0000 mL | Freq: Two times a day (BID) | OROMUCOSAL | Status: DC
  Administered 2015-02-04 – 2015-02-08 (×8): 7 mL via OROMUCOSAL

## 2015-02-04 MED ORDER — METRONIDAZOLE 500 MG PO TABS
500.0000 mg | ORAL_TABLET | Freq: Three times a day (TID) | ORAL | Status: DC
  Administered 2015-02-04 – 2015-02-08 (×13): 500 mg via ORAL
  Filled 2015-02-04 (×18): qty 1

## 2015-02-04 MED ORDER — RISPERIDONE 0.5 MG PO TABS
0.5000 mg | ORAL_TABLET | Freq: Two times a day (BID) | ORAL | Status: DC
  Administered 2015-02-04 – 2015-02-07 (×8): 0.5 mg via ORAL
  Filled 2015-02-04 (×10): qty 1

## 2015-02-04 MED ORDER — WARFARIN SODIUM 7.5 MG PO TABS
7.5000 mg | ORAL_TABLET | Freq: Every day | ORAL | Status: DC
  Administered 2015-02-04 – 2015-02-07 (×4): 7.5 mg via ORAL
  Filled 2015-02-04 (×6): qty 1

## 2015-02-04 MED ORDER — TRAZODONE HCL 50 MG PO TABS
50.0000 mg | ORAL_TABLET | Freq: Every evening | ORAL | Status: DC | PRN
  Administered 2015-02-04 – 2015-02-07 (×3): 50 mg via ORAL
  Filled 2015-02-04 (×4): qty 1

## 2015-02-04 MED ORDER — ALTEPLASE 2 MG IJ SOLR
2.0000 mg | Freq: Once | INTRAMUSCULAR | Status: DC
  Filled 2015-02-04: qty 2

## 2015-02-04 MED ORDER — POTASSIUM CHLORIDE 10 MEQ/50ML IV SOLN
10.0000 meq | INTRAVENOUS | Status: AC
  Administered 2015-02-04 (×4): 10 meq via INTRAVENOUS
  Filled 2015-02-04 (×4): qty 50

## 2015-02-04 NOTE — Progress Notes (Signed)
[address]       [place] [address]             [phone]        CARDIOTHORACIC SURGERY PROGRESS NOTE   R11 Days Post-Op Procedure(s) (LRB): TRANSESOPHAGEAL ECHOCARDIOGRAM (TEE) (N/A) Re-Operation, MINIMALLY INVASIVE MITRAL VALVE (MV) REPLACEMENT. (Right)  Subjective: More confused.  Denies pain, SOB.  Drinking fluids well.  Eating some.  Very weak.  Objective: Vital signs: BP Readings from Last 1 Encounters:  02/04/15 106/72   Pulse Readings from Last 1 Encounters:  02/04/15 71   Resp Readings from Last 1 Encounters:  02/04/15 34   Temp Readings from Last 1 Encounters:  02/04/15 99.7 F (37.6 C) Oral    Hemodynamics:    Physical Exam:  Rhythm:   Afib w/ controlled rated  Breath sounds: Fairly clear  Heart sounds:  Irregular w/ mechanical valve sounds  Incisions:  Clean and dry, mild skin edge necrosis right groin  Abdomen:  Soft, non-distended, non-tender  Extremities:  Warm, well-perfused    Intake/Output from previous day: 06/04 0701 - 06/05 0700 In: 1660 [P.O.:1440; I.V.:220] Out: 2855 [Urine:2635; Chest Tube:220] Intake/Output this shift: Total I/O In: 600 [P.O.:600] Out: 350 [Urine:275; Chest Tube:75]  Lab Results:  CBC: Recent Labs  02/03/15 0450  WBC 11.4*  HGB 11.5*  HCT 39.6  PLT 224    BMET:  Recent Labs  02/02/15 0525 02/03/15 0450  NA 147* 151*  K 3.6 4.0  CL 102 98*  CO2 35* 40*  GLUCOSE 93 86  BUN 36* 31*  CREATININE 0.90 0.98  CALCIUM 8.2* 8.8*     PT/INR:   Recent Labs  02/04/15 0450  LABPROT 23.2*  INR 2.07*    CBG (last 3)   Recent Labs  02/03/15 1242 02/03/15 1634 02/04/15 0807  GLUCAP 87 99 104*    ABG    Component Value Date/Time   PHART 7.224* 01/30/2015 1521   PCO2ART 75.5* 01/30/2015 1521   PO2ART 127.0* 01/30/2015 1521   HCO3 30.7* 01/30/2015 1521   TCO2 33 01/30/2015 1521   ACIDBASEDEF 1.0 01/26/2015 0425   O2SAT 70.1 01/31/2015 0420     CXR: n/a  Assessment/Plan: S/P Procedure(s) (LRB): TRANSESOPHAGEAL ECHOCARDIOGRAM (TEE) (N/A) Re-Operation, MINIMALLY INVASIVE MITRAL VALVE (MV) REPLACEMENT. (Right)  RESP:Resolving acute respiratory failure due to ARDS, acute lung injury, acute exacerbation of chronic diastolic CHF and possible HCAP complicating chronic respiratory failure due to chronic diastolic CHF, OSA, chronic pulmonary hypertension. Extubated uneventfully Friday morning and very stable ever since. Monitor closely and mobilize out of bed. Continue home CPAP for sleeping. Pleural tubes still draining but output decreased - will leave in until output decreases further  NT:ZGYFVCB hemodynamically stable in chronic permanent Afib with good rate control. Acute exacerbation of chronic diastolic CHF has resolved, now may be relatively dehydrated  ID:Low grade fevers resolving. Patient has been + for C. Diff enterocolitis but follow up negative. Abdominal exam benign.Diarrhea decreased. Started on Flagyl via tube 5/31. Otherwise now off all other antibiotics.  RENAL:Weight trending down and now well below pre-op baseline. No labs this morning - will recheck sodium.  Continue to hold lasix for now  HEME:Expected post op acute blood loss anemia. INR subtherapeutic on coumadin but trending up now 2.1 - continue increased dose today.  SWH:QPRFFMBW oral diet. Recheck serum sodium  ENDO:Type II diabetes mellitus, excellent glycemic control on SSI - will continue to hold levemir insulin for now  NEURO:Intact but  more confused.  Potentially related to hypernatremia and ICU psychosis.  Started on Risperdal and trazodone today.  DISP:Anticipate full recovery. Severely debilitated.  Potentially a candidate for d/c to inpatient rehab service later this week if he continues to  improve.   Lance Cook 02/04/2015 10:29 AM

## 2015-02-04 NOTE — Progress Notes (Signed)
TCTS BRIEF SICU PROGRESS NOTE  11 Days Post-Op  S/P Procedure(s) (LRB): TRANSESOPHAGEAL ECHOCARDIOGRAM (TEE) (N/A) Re-Operation, MINIMALLY INVASIVE MITRAL VALVE (MV) REPLACEMENT. (Right)   Stable day Still somewhat confused but improved Ambulating some in room w/ assistance  Plan: Continue current plan  Rexene Alberts 02/04/2015 5:57 PM

## 2015-02-04 NOTE — Progress Notes (Signed)
PULMONARY / CRITICAL CARE MEDICINE   Name: Lance Cook MRN: [id number] DOB: [birth year]    ADMISSION DATE:  01/24/2015 CONSULTATION DATE:  02/04/2015  REFERRING MD :  Roxy Manns  CHIEF COMPLAINT:  Hypoxemia post op  INITIAL PRESENTATION: 54 y.o. M with extensive cardiac hx including severe MR.  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 Roxy Manns via rt thoracotomy.  Following procedure, remained hypoxemic and had edema vs ARDS on CXR.  PCCM consulted for recs. Long pump time. Prior [place] aortic root replacement in 1999 as well as CABG x 1     SUBJECTIVE: Hallucinating Trying to get out of bed Oxygenating well  VITAL SIGNS: Temp:  [98 F (36.7 C)-100.2 F (37.9 C)] 98.2 F (36.8 C) (06/04 2000) Pulse Rate:  [40-137] 86 (06/05 0600) Resp:  [14-37] 26 (06/05 0600) BP: (96-139)/(48-96) 129/96 mmHg (06/05 0600) SpO2:  [80 %-100 %] 96 % (06/05 0600) HEMODYNAMICS:   VENTILATOR SETTINGS:   INTAKE / OUTPUT: Intake/Output      06/04 0701 - 06/05 0700   P.O. 1440   I.V. (mL/kg) 220 (2.2)   Total Intake(mL/kg) 1660 (16.6)   Urine (mL/kg/hr) 2635 (1.1)   Stool 0 (0)   Chest Tube 220 (0.1)   Total Output 2855   Net -1195         PHYSICAL EXAMINATION: General: awake in bed HENT: NCAT OP clear PULM: CTA B today CV: Irreg irreg, mech S1 GI: soft, nontender Derm: edema extremities noted Neuro: Awake, alert, cannot focus on conversation, tangential speech, confused, MAEW  LABS:  CBC  Recent Labs Lab 01/31/15 0400 02/01/15 0400 02/03/15 0450  WBC 8.1 8.2 11.4*  HGB 10.3* 10.3* 11.5*  HCT 33.6* 34.4* 39.6  PLT 117* 150 224   Coag's  Recent Labs Lab 02/02/15 0525 02/03/15 0450 02/04/15 0450  INR 1.70* 1.90* 2.07*   BMET  Recent Labs Lab 02/01/15 0400 02/02/15 0525 02/03/15 0450  NA 144 147* 151*  K 3.3* 3.6 4.0  CL 100* 102 98*  CO2 34* 35* 40*  BUN 35* 36* 31*  CREATININE 0.90 0.90 0.98  GLUCOSE 104* 93 86   Electrolytes  Recent Labs Lab 02/01/15 0400  02/02/15 0525 02/03/15 0450  CALCIUM 7.7* 8.2* 8.8*   Sepsis Markers  Recent Labs Lab 02/01/15 0400  PROCALCITON 0.22   ABG  Recent Labs Lab 01/30/15 1521  PHART 7.224*  PCO2ART 75.5*  PO2ART 127.0*   Liver Enzymes No results for input(s): AST, ALT, ALKPHOS, BILITOT, ALBUMIN in the last 168 hours. Cardiac Enzymes No results for input(s): TROPONINI, PROBNP in the last 168 hours. Glucose  Recent Labs Lab 02/02/15 1954 02/03/15 0002 02/03/15 0402 02/03/15 0820 02/03/15 1242 02/03/15 1634  GLUCAP 97 97 88 98 87 99    6/4 CXR images reviewed: improving infiltrates with some persistence of R lung consolidation    ASSESSMENT / PLAN:  PULMONARY ETT 5/25 >> 6/03 A:  Acute hypoxic resp failure > improving, now on 2-3 L  Pulm infiltrates (edema at this point) - improving  OSA P:   Maintain in ICU CPAP via home machine/mask  Supp O2 to maintain SpO2 > 92 % Airway hygiene - encourage cough Maintain nebulized steroid to bid, will d/c 6/5 if no wheezing  CARDIOVASCULAR A:  Cardiogenic shock, resolved Severe MR - s/p minimally invasive MVR 01/24/15 H/o CAD, HTN, HLD Chronic dCHF Hx PAF P:  Hemodynamic mgmt per TCTS  RENAL A:   AKI, resolved Hypokalemia > resolved Anasarca improving Hypernatremia P:  Monitor BMET intermittently Monitor I/Os Correct electrolytes as indicated Furosemide gtt per TCTS > consider holding lasix today and resuming intermittent tomorrow Continue Supplement K+ with K Allow free water intake po  GASTROINTESTINAL A:   GERD Post extubation dysphagia P:   NPO post ext SLP eval 6/04, pending  HEMATOLOGIC A:   ICU acquired anemia > resolving P:  DVT px: warfarin (mech valve) Monitor CBC intermittently Transfuse per usual ICU guidelines  INFECTIOUS A:   C diff colitis P:   Aztreonam start date 5/27 >> 6/02 Vancomycin, start date 5/25 >> 6/03 Metronidazole 5/31 >>  Complete 14 days rx for uncomplicated C  diff  ENDOCRINE A:   DM2, controlled P:   Cont SSI  NEUROLOGIC A:   ICU delirium with hallucinations> worse 6/5 Insomnia P:   RASS goal: 0 Cont current meds Start risperdal 0.5 bid, check QTc Trazodone qHS prn sleep  Roselie Awkward, MD [place] [facility] Pager: [phone] Cell: [phone] After 3pm or if no response, call [phone]

## 2015-02-05 ENCOUNTER — Inpatient Hospital Stay (HOSPITAL_COMMUNITY): Payer: Medicare HMO

## 2015-02-05 DIAGNOSIS — G9340 Encephalopathy, unspecified: Secondary | ICD-10-CM

## 2015-02-05 DIAGNOSIS — R5381 Other malaise: Secondary | ICD-10-CM

## 2015-02-05 DIAGNOSIS — E876 Hypokalemia: Secondary | ICD-10-CM

## 2015-02-05 LAB — CBC
HCT: 36.6 % — ABNORMAL LOW (ref 39.0–52.0)
HEMOGLOBIN: 10.7 g/dL — AB (ref 13.0–17.0)
MCH: 25.8 pg — ABNORMAL LOW (ref 26.0–34.0)
MCHC: 29.2 g/dL — AB (ref 30.0–36.0)
MCV: 88.2 fL (ref 78.0–100.0)
Platelets: 211 10*3/uL (ref 150–400)
RBC: 4.15 MIL/uL — AB (ref 4.22–5.81)
RDW: 17.6 % — AB (ref 11.5–15.5)
WBC: 12.2 10*3/uL — ABNORMAL HIGH (ref 4.0–10.5)

## 2015-02-05 LAB — GLUCOSE, CAPILLARY
GLUCOSE-CAPILLARY: 69 mg/dL (ref 65–99)
GLUCOSE-CAPILLARY: 81 mg/dL (ref 65–99)
Glucose-Capillary: 74 mg/dL (ref 65–99)
Glucose-Capillary: 90 mg/dL (ref 65–99)
Glucose-Capillary: 100 mg/dL — ABNORMAL HIGH (ref 65–99)
Glucose-Capillary: 99 mg/dL (ref 65–99)

## 2015-02-05 LAB — COMPREHENSIVE METABOLIC PANEL
ALK PHOS: 37 U/L — AB (ref 38–126)
ALT: 14 U/L — AB (ref 17–63)
AST: 20 U/L (ref 15–41)
Albumin: 2 g/dL — ABNORMAL LOW (ref 3.5–5.0)
Anion gap: 8 (ref 5–15)
BUN: 24 mg/dL — ABNORMAL HIGH (ref 6–20)
CHLORIDE: 96 mmol/L — AB (ref 101–111)
CO2: 39 mmol/L — ABNORMAL HIGH (ref 22–32)
CREATININE: 0.82 mg/dL (ref 0.61–1.24)
Calcium: 7.8 mg/dL — ABNORMAL LOW (ref 8.9–10.3)
GFR calc Af Amer: >60 mL/min (ref >60)
GFR calc non Af Amer: >60 mL/min (ref >60)
GLUCOSE: 75 mg/dL (ref 65–99)
POTASSIUM: 3.3 mmol/L — AB (ref 3.5–5.1)
Sodium: 143 mmol/L (ref 135–145)
TOTAL PROTEIN: 4.7 g/dL — AB (ref 6.5–8.1)
Total Bilirubin: 0.8 mg/dL (ref 0.3–1.2)

## 2015-02-05 LAB — PROTIME-INR
INR: 1.93 — AB (ref 0.00–1.49)
Prothrombin Time: 21.9 seconds — ABNORMAL HIGH (ref 11.6–15.2)

## 2015-02-05 LAB — PREALBUMIN: Prealbumin: 14.1 mg/dL — ABNORMAL LOW (ref 18–38)

## 2015-02-05 MED ORDER — FUROSEMIDE 40 MG PO TABS: 40.0000 mg | ORAL_TABLET | Freq: Every day | ORAL | Status: DC

## 2015-02-05 MED ORDER — POTASSIUM CHLORIDE 10 MEQ/50ML IV SOLN
10.0000 meq | INTRAVENOUS | Status: AC
  Administered 2015-02-05 (×3): 10 meq via INTRAVENOUS
  Filled 2015-02-05 (×3): qty 50

## 2015-02-05 MED ORDER — FUROSEMIDE 40 MG PO TABS
40.0000 mg | ORAL_TABLET | Freq: Two times a day (BID) | ORAL | Status: DC
  Administered 2015-02-05 – 2015-02-08 (×8): 40 mg via ORAL
  Filled 2015-02-05 (×9): qty 1

## 2015-02-05 MED ORDER — POTASSIUM CHLORIDE ER 10 MEQ PO TBCR
20.0000 meq | EXTENDED_RELEASE_TABLET | Freq: Two times a day (BID) | ORAL | Status: DC
  Administered 2015-02-05 (×2): 20 meq via ORAL
  Filled 2015-02-05 (×5): qty 2

## 2015-02-05 MED ORDER — WHITE PETROLATUM GEL
Status: DC | PRN
  Administered 2015-02-05: 0.2 via TOPICAL
  Filled 2015-02-05 (×2): qty 28.35

## 2015-02-05 NOTE — Evaluation (Signed)
Occupational Therapy Evaluation Patient Details Name: Lance Cook MRN: [id number] DOB: [birth year] Today's Date: 02/05/2015    History of Present Illness 54 y.o. M with extensive cardiac hx including severe MR. 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 Roxy Manns via rt thoracotomy. Following procedure, remained hypoxemic and had edema vs ARDS on CXR. Remained intubated until 6/3. PMHx- morbid obesity, DM, CVA age 56, CHF, aortic dissection, AVR, OSA   Clinical Impression   Pt admitted with above. He demonstrates the below listed deficits and will benefit from continued OT to maximize safety and independence with BADLs.  Pt demonstrates generalized weakness, impaired balance, and impaired coordination.  Currently, he requires mod A for ADLs and min A - mod A +2 for functional mobility.  Recommend CIR.       Follow Up Recommendations  CIR;Supervision/Assistance - 24 hour    Equipment Recommendations  3 in 1 bedside comode    Recommendations for Other Services Rehab consult     Precautions / Restrictions Precautions Precautions: Fall      Mobility Bed Mobility Overal bed mobility: Needs Assistance;+2 for physical assistance;+ 2 for safety/equipment Bed Mobility: Supine to Sit     Supine to sit: Min assist;HOB elevated     General bed mobility comments: Min A to lift trunk from bed and to scoot hips to EOB   Transfers Overall transfer level: Needs assistance Equipment used: 2 person hand held assist Transfers: Sit to/from [employer] Sit to Stand: Min assist;+2 physical assistance;+2 safety/equipment Stand pivot transfers: Mod assist;+2 physical assistance;+2 safety/equipment       General transfer comment: as began to step-pivot to chair, pt with increasing posterior lean. He attempted to compensate with hip flexion and bringing torso forward, making stepping more difficult    Balance Overall balance assessment: Needs assistance Sitting-balance  support: No upper extremity supported;Feet unsupported Sitting balance-Leahy Scale: Poor Sitting balance - Comments: minguard assist jue to slight post lean Postural control: Posterior lean Standing balance support: Bilateral upper extremity supported Standing balance-Leahy Scale: Poor                              ADL Overall ADL's : Needs assistance/impaired Eating/Feeding: Independent;Sitting   Grooming: Wash/dry hands;Wash/dry face;Oral care;Set up;Sitting   Upper Body Bathing: Minimal assitance;Sitting   Lower Body Bathing: Sit to/from stand;Maximal assistance   Upper Body Dressing : Moderate assistance;Sitting   Lower Body Dressing: Maximal assistance;Sit to/from stand Lower Body Dressing Details (indicate cue type and reason): Pt able to bed forward to push sock off of heel  Toilet Transfer: Minimal assistance;+2 for safety/equipment Toilet Transfer Details (indicate cue type and reason): Pt is mildly impulsive.  Requires cues and assist for safety.   Toileting- Clothing Manipulation and Hygiene: Maximal assistance;Sit to/from stand Toileting - Clothing Manipulation Details (indicate cue type and reason): Pt incontinent of stool while using urinal.  Assist for peri care      Functional mobility during ADLs: Minimal assistance;+2 for safety/equipment       Vision     Perception     Praxis      Pertinent Vitals/Pain Pain Assessment: 0-10 Pain Score: 1  Pain Location: Rt chest - incisional pain  Pain Descriptors / Indicators: Dull Pain Intervention(s): Monitored during session     Hand Dominance Right   Extremity/Trunk Assessment Upper Extremity Assessment Upper Extremity Assessment: Generalized weakness   Lower Extremity Assessment Lower Extremity Assessment: Defer to PT evaluation  Cervical / Trunk Assessment Cervical / Trunk Assessment: Normal   Communication Communication Communication: No difficulties   Cognition Arousal/Alertness:  Awake/alert Behavior During Therapy: WFL for tasks assessed/performed Overall Cognitive Status: Impaired/Different from baseline                 General Comments: Pt with intermittent confusion    General Comments       Exercises       Shoulder Instructions      Home Living Family/patient expects to be discharged to:: Private residence Living Arrangements: Spouse/significant other Available Help at Discharge: Family;Available 24 hours/day Type of Home: Mobile home Home Access: Stairs to enter Entrance Stairs-Number of Steps: 5-6 Entrance Stairs-Rails: Right Home Layout: One level     Bathroom Shower/Tub: Occupational psychologist: Standard         Additional Comments: Pt reports that wife is 4'9" and can oly do minimal lifting.  She has visual deficits and pt has to help with with visual tasks.  He is the driver in the family.  They have investigated public transportation, but [employer] [place] not service their area of the county       Prior Functioning/Environment Level of Independence: Needs assistance  Gait / Transfers Assistance Needed: per pt walked household distances only; used motorized cart at the store ADL's / Homemaking Assistance Needed: Pt reports wife assisted him with LB dressing    Comments: Pt with intermittent confusion.  Wife not present to confirm information     OT Diagnosis: Generalized weakness;Cognitive deficits;Acute pain   OT Problem List: Decreased strength;Decreased activity tolerance;Impaired balance (sitting and/or standing);Decreased cognition;Decreased safety awareness;Obesity;Pain   OT Treatment/Interventions: Self-care/ADL training;DME and/or AE instruction;Therapeutic activities;Patient/family education;Balance training    OT Goals(Current goals can be found in the care plan section) Acute Rehab OT Goals Patient Stated Goal: to get better and go home OT Goal Formulation: With patient Time For Goal Achievement:  02/19/15 Potential to Achieve Goals: Good ADL Goals Pt Will Perform Grooming: standing;with min guard assist Pt Will Perform Upper Body Bathing: with set-up;sitting Pt Will Perform Lower Body Bathing: with min assist;sit to/from stand Pt Will Perform Upper Body Dressing: with set-up;sitting Pt Will Perform Lower Body Dressing: with min assist;sit to/from stand Pt Will Transfer to Toilet: with min guard assist;ambulating;regular height toilet;bedside commode;grab bars Pt Will Perform Toileting - Clothing Manipulation and hygiene: with min guard assist;sit to/from stand  OT Frequency: Min 2X/week   Barriers to D/C: Decreased caregiver support          Co-evaluation              End of Session Nurse Communication: Mobility status  Activity Tolerance: Patient tolerated treatment well Patient left: in chair;with call bell/phone within reach;with chair alarm set;with nursing/sitter in room   Time: 1316-1403 OT Time Calculation (min): 47 min Charges:  OT General Charges $OT Visit: 1 Procedure OT Evaluation $Initial OT Evaluation Tier I: 1 Procedure OT Treatments $Self Care/Home Management : 8-22 mins G-Codes:    Tianah Lonardo M 2015-02-14, 2:34 PM

## 2015-02-05 NOTE — Progress Notes (Signed)
Patient ID: Lance Cook, male   DOB: Apr 08, 1961, 54 y.o.   MRN: [id number] EVENING ROUNDS NOTE :     [address]       [place] [address]             [phone]                 12 Days Post-Op Procedure(s) (LRB): TRANSESOPHAGEAL ECHOCARDIOGRAM (TEE) (N/A) Re-Operation, MINIMALLY INVASIVE MITRAL VALVE (MV) REPLACEMENT. (Right)  Total Length of Stay:  LOS: 12 days  BP 123/67 mmHg  Pulse 87  Temp(Src) 97 F (36.1 C) (Oral)  Resp 27  Ht 5\' 1"  (1.549 m)  Wt 222 lb 14.2 oz (101.1 kg)  BMI 42.14 kg/m2  SpO2 100%  .Intake/Output      06/06 0701 - 06/07 0700   P.O. 840   I.V. (mL/kg) 130 (1.3)   Other 125   IV Piggyback 200   Total Intake(mL/kg) 1295 (12.8)   Urine (mL/kg/hr) 725 (0.6)   Chest Tube    Total Output 725   Net +570         . sodium chloride Stopped (01/31/15 0000)  . sodium chloride 10 mL/hr at 02/05/15 1900     Lab Results  Component Value Date   WBC 12.2* 02/05/2015   HGB 10.7* 02/05/2015   HCT 36.6* 02/05/2015   PLT 211 02/05/2015   GLUCOSE 75 02/05/2015   CHOL 118 08/18/2012   TRIG 133.0 08/18/2012   HDL 27.20* 08/18/2012   LDLCALC 64 08/18/2012   ALT 14* 02/05/2015   AST 20 02/05/2015   NA 143 02/05/2015   K 3.3* 02/05/2015   CL 96* 02/05/2015   CREATININE 0.82 02/05/2015   BUN 24* 02/05/2015   CO2 39* 02/05/2015   TSH 1.67 10/08/2011   PSA 0.06* 10/08/2011   INR 1.93* 02/05/2015   HGBA1C 5.9* 01/22/2015   MICROALBUR 19.5* 10/08/2011   Walked 2/3 around unit today Confusion improving  Grace Isaac MD  Beeper [phone] Office [phone] 02/05/2015 7:04 PM

## 2015-02-05 NOTE — Progress Notes (Signed)
Physical Therapy  Abbreviated session as completing OT, toileting, and then wanted to eat his lunch. PT to return after his lunch for further PT.   02/05/15 1403  PT Visit Information  Last PT Received On 02/05/15  Assistance Needed +2  PT/OT/SLP Co-Evaluation/Treatment Yes  Reason for Co-Treatment For patient/therapist safety (ICU/multiple lines/impulsive to get to [facility])  PT goals addressed during session Balance;Mobility/safety with mobility  History of Present Illness 54 y.o. M with extensive cardiac hx including severe MR. 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 Roxy Manns via rt thoracotomy. Following procedure, remained hypoxemic and had edema vs ARDS on CXR. Remained intubated until 6/3. PMHx- morbid obesity, DM, CVA age 54, CHF, aortic dissection, AVR, OSA  PT Time Calculation  PT Start Time (ACUTE ONLY) 1338  PT Stop Time (ACUTE ONLY) 1403  PT Time Calculation (min) (ACUTE ONLY) 25 min  Subjective Data  Patient Stated Goal to get better and go home  Precautions  Precautions Fall  Pain Assessment  Pain Assessment No/denies pain  Cognition  Arousal/Alertness Awake/alert  Behavior During Therapy WFL for tasks assessed/performed  Overall Cognitive Status Impaired/Different from baseline  Area of Impairment Safety/judgement  Safety/Judgement Decreased awareness of safety;Decreased awareness of deficits  General Comments Pt with intermittent confusion   Transfers  Overall transfer level Needs assistance  Equipment used 2 person hand held assist  Transfers Sit to/from [employer] Transfers  Sit to Stand Min assist;+2 physical assistance;+2 safety/equipment  Stand pivot transfers Mod assist;+2 physical assistance;+2 safety/equipment  General transfer comment balance better, but remains unsteady   Ambulation/Gait  Ambulation/Gait assistance Min assist;+2 safety/equipment  Ambulation Distance (Feet) 5 Feet  Assistive device 1 person hand held assist  General Gait  Details ambulated BSC to chair (after standing for pericare); pt requesting to defer further ambulation until after he eats lunch (delivered during session)  Gait Pattern/deviations Step-to pattern;Decreased stride length;Trunk flexed  Balance  Standing balance support Single extremity supported;During functional activity  Standing balance-Leahy Scale Poor  General Comments  General comments (skin integrity, edema, etc.) OT finishing her session when pt incontinent of bowels and needed assist to clean pt and safely transfer to [facility]. Plan was to "dove-tail" treatments, however after clean-up pt requested PT return after eating his lunch   PT - End of Session  Equipment Utilized During Treatment Oxygen  Activity Tolerance Patient tolerated treatment well  Patient left in chair;with call bell/phone within reach;with nursing/sitter in room;with chair alarm set  Nurse Communication Mobility status  PT - Assessment/Plan  PT Plan Current plan remains appropriate  PT Frequency (ACUTE ONLY) Min 3X/week  Follow Up Recommendations CIR;Supervision/Assistance - 24 hour  PT equipment (TBA)  PT Goal Progression  Progress towards PT goals Progressing toward goals  Acute Rehab PT Goals  Time For Goal Achievement 02/16/15  PT General Charges  $$ ACUTE PT VISIT 1 Procedure  PT Treatments  $Therapeutic Activity 8-22 mins   Pager [phone]

## 2015-02-05 NOTE — Progress Notes (Signed)
[address]       [place] [address]             [phone]        CARDIOTHORACIC SURGERY PROGRESS NOTE   R12 Days Post-Op Procedure(s) (LRB): TRANSESOPHAGEAL ECHOCARDIOGRAM (TEE) (N/A) Re-Operation, MINIMALLY INVASIVE MITRAL VALVE (MV) REPLACEMENT. (Right)  Subjective: Looks better.  Reportedly slept well last night and confusion seems much improved.  Denies pain or SOB.  Reports breathing is much better than prior to admission.  Ate his entire breakfast.  Objective: Vital signs: BP Readings from Last 1 Encounters:  02/05/15 111/80   Pulse Readings from Last 1 Encounters:  02/05/15 85   Resp Readings from Last 1 Encounters:  02/05/15 22   Temp Readings from Last 1 Encounters:  02/05/15 97.5 F (36.4 C) Axillary    Hemodynamics:    Physical Exam:  Rhythm:   Afib w/ controlled rate  Breath sounds: clear  Heart sounds:  Irregular w/ mechanical heart valve sounds, no murmur  Incisions:  Clean and dry, mild skin edge necrosis right groin incision  Abdomen:  Soft, non-distended, non-tender, diarrhea seems to be resolving  Extremities:  Warm, well-perfused.  LE edema dramatically improved in c/w preoperatively  Chest tubes:  Low volume thin serosanguinous output but still draining some, no air leak     Intake/Output from previous day: 06/05 0701 - 06/06 0700 In: 2010 [P.O.:1640; I.V.:220; IV Piggyback:150] Out: 2200 [Urine:1925; Chest Tube:275] Intake/Output this shift: Total I/O In: 555 [P.O.:360; I.V.:20; Other:125; IV Piggyback:50] Out: 125 [Urine:125]  Lab Results:  CBC: Recent Labs  02/03/15 0450 02/05/15 0521  WBC 11.4* 12.2*  HGB 11.5* 10.7*  HCT 39.6 36.6*  PLT 224 211    BMET:  Recent Labs  02/04/15 1033 02/05/15 0521  NA 139 143  K 3.2* 3.3*  CL 93* 96*  CO2 38* 39*  GLUCOSE 144* 75  BUN 28* 24*  CREATININE 0.91 0.82  CALCIUM 8.0* 7.8*     PT/INR:   Recent Labs  02/05/15 0521  LABPROT 21.9*  INR  1.93*    CBG (last 3)   Recent Labs  02/04/15 2154 02/05/15 0359 02/05/15 0803  GLUCAP 74 69 81    ABG    Component Value Date/Time   PHART 7.224* 01/30/2015 1521   PCO2ART 75.5* 01/30/2015 1521   PO2ART 127.0* 01/30/2015 1521   HCO3 30.7* 01/30/2015 1521   TCO2 33 01/30/2015 1521   ACIDBASEDEF 1.0 01/26/2015 0425   O2SAT 70.1 01/31/2015 0420    CXR: PORTABLE CHEST - 1 VIEW  COMPARISON: 02/03/2015 and earlier.  FINDINGS: Portable AP upright view at 0642 hours. Two right chest tubes remain in place. Stable right PICC line. Stable lung volumes and no pneumothorax or pleural effusion identified. Stable cardiomegaly and mediastinal contours. Prosthetic cardiac valve and sternotomy wires re- identified. Stable vascular congestion without overt edema. Decreased patchy opacity in the right lung since 02/02/2015. No areas of worsening ventilation.  IMPRESSION: 1. Stable lines and tubes. 2. No pneumothorax. Stable mild vascular congestion.   Electronically Signed  By: Genevie Ann M.D.  On: 02/05/2015 07:43  Assessment/Plan: S/P Procedure(s) (LRB): TRANSESOPHAGEAL ECHOCARDIOGRAM (TEE) (N/A) Re-Operation, MINIMALLY INVASIVE MITRAL VALVE (MV) REPLACEMENT. (Right)  RESP:Resolving acute respiratory failure due to ARDS, acute lung injury, acute exacerbation of chronic diastolic CHF and possible HCAP complicating chronic respiratory failure due to chronic diastolic CHF, OSA, chronic pulmonary hypertension. Extubated uneventfully Friday morning and very stable ever since. Monitor closely  and mobilize out of bed. Continue home CPAP for sleeping. Pleural tubes still draining but output decreased - will leave in until output decreases further  WO:EHOZYYQ hemodynamically stable in chronic permanent Afib with good rate control. Acute exacerbation of chronic diastolic CHF has resolved, now may be relatively  dehydrated  ID:Low grade fevers resolved. Patient has previously been + for C. Diff enterocolitis but follow up negative. Abdominal exam benign.Diarrhea decreased. Started on Flagyl via tube 5/31. Otherwise now off all other antibiotics.  RENAL:Weight trending down and now well below pre-op baseline. I/O's balanced yesterday.  Restart lasix at reduced dose.  HEME:Expected post op acute blood loss anemia. INR subtherapeutic on coumadin - missed a dose of coumadin on 6/4 - continue increased dose today.  MGN:[id number] oral diet. Hypokalemia getting replaced.  ENDO:Type II diabetes mellitus, excellent glycemic control on SSI - will continue to hold levemir insulin for now  NEURO:Intact and less confused. Continue Risperdal and trazodone today.  DISP:Anticipate full recovery. Severely debilitated. Potentially a candidate for d/c to inpatient rehab service later this week if he continues to improve.   Rexene Alberts 02/05/2015 9:25 AM

## 2015-02-05 NOTE — Progress Notes (Signed)
I will begin insurance authorization with [facility] for a possible admission to inpt rehab pending insurance approval when pt medically ready. [phone]

## 2015-02-05 NOTE — Consult Note (Signed)
Physical Medicine and Rehabilitation Consult  Reason for Consult: Deconditioning with encephalopathy past MVR Referring Physician: Dr. Roxy Manns   HPI: Lance Cook is a 54 y.o. male with history of CAF, PVD, CAD s/p CAD with AVR '99, severe MR who was admitted for minimally invasive MVR on 01/24/15 by Dr. Roxy Manns. Post op with hypoxemia due to edema v/s ARDS and required prolonged vent support. He was treated with aggressive diuresis and possible HCAP.  He has had issues with mucous plugging as well as c diff colitis and was started on metronidazole on 05/31. He tolerated extubation with prn BIPAP use on 06/03. He has had worsening of ICU delirium with confusion and hallucinations 6/05 and Risperdal was added to help with symptoms. PT evaluation done this weekend and patient noted to be severely deconditioned. CIR recommended by MD and PT for follow up therapy.   Spoke with patient's RN, he had a better night last night, rectal tube is out, still has right-sided chest tube. Confusion is improving but still has language of confusion Review of Systems  Constitutional: Positive for malaise/fatigue.  HENT: Negative for hearing loss.   Eyes: Negative for blurred vision and double vision.  Respiratory: Negative for cough, shortness of breath and wheezing.   Cardiovascular: Negative for chest pain and palpitations.  Gastrointestinal: Positive for heartburn and diarrhea. Negative for nausea and vomiting.  Genitourinary:       Foley in place  Musculoskeletal:       Buttock pain  Neurological: Positive for dizziness. Negative for headaches.  Psychiatric/Behavioral: The patient does not have insomnia (slept well last night. ).       Past Medical History  Diagnosis Date  . Hyperlipidemia   . Hypertension   . Arthritis   . Leg pain 06/28/2010  . Hiatal hernia   . Atrial fibrillation     chronic persistent  . Reflux 11/06/09  . Myocardial infarction age 38  . Varicose veins   . Obesity   .  Peripheral vascular disease   . Gout   . CAD (coronary artery disease)     Old scar inferior wall myoview, 10/2009 EF 52%  . Chronic LBP 10/08/2011  . Impaired glucose tolerance 10/08/2011  . Erectile dysfunction 10/08/2011  . Testicular cancer   . Bell's palsy   . TIA (transient ischemic attack)     age 37  . CVA (cerebral infarction)     54yo  . OSA (obstructive sleep apnea)     CPAP  . History of colon polyps   . Anemia   . Hypogonadism male 04/08/2012  . Aortic aneurysm   . Aortic aneurysm and dissection   . 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     1988 - Dr Blase Mess at [facility] in [place], [place]  . Ascending aortic dissection 07/14/2008    Localized dissection of ascending aorta noted on CTA in 2009 and stable on CTA in 2011  . Severe mitral regurgitation 10/11/2014  . Morbid obesity 04/02/2014  . Diabetes mellitus     diet controlled  . DIABETES MELLITUS, TYPE II 11/01/2009    Qualifier: Diagnosis of  By: [name] MD, [name]    . Dysrhythmia   . CHF (congestive heart failure)   . Shortness of breath dyspnea     with exertion  . Anxiety   . GERD (gastroesophageal reflux disease)     not needing medication at thhis time- 01/22/15  . Stroke  TIA and Stroke no residual effect- 1st was 49  . Chronic diastolic congestive heart failure   . 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 12/01/270    33 mm St Jude bileaflet mechanical valve placed via right mini thoracotomy approach    Past Surgical History  Procedure Laterality Date  . Laser ablation  03/06/2010    left leg  . Cardiac valve replacement      [id number]  . Cholecystectomy  2011  . Tonsillectomy  1967  . Otoplasty      bilateral, age 26  . Orchiectomy  age 87    testicular cancer  . Tee without cardioversion N/A 10/11/2014    Procedure: TRANSESOPHAGEAL ECHOCARDIOGRAM (TEE);  Surgeon: Pixie Casino, MD;  Location: [facility] ENDOSCOPY;   Service: Cardiovascular;  Laterality: N/ADeneen Harts procedure  1988    25 mm St Jude mechanical valve conduit - Dr Blase Mess at [facility] in [place], [place]  . Coronary artery bypass graft    . Aortic truck    . Tee without cardioversion N/A 01/24/2015    Procedure: TRANSESOPHAGEAL ECHOCARDIOGRAM (TEE);  Surgeon: Rexene Alberts, MD;  Location: [place];  Service: Open Heart Surgery;  Laterality: N/A;  . Mitral valve replacement Right 01/24/2015    Procedure: Re-Operation, MINIMALLY INVASIVE MITRAL VALVE (MV) REPLACEMENT.;  Surgeon: Rexene Alberts, MD;  Location: [place];  Service: Open Heart Surgery;  Laterality: Right;    Family History  Problem Relation Age of Onset  . Heart disease Mother   . Throat cancer Mother   . Diabetes Father   . Stroke Other   . Hypertension Other   . Cancer Maternal Aunt     lung, brain  . Cancer Maternal Uncle     brain aneurysm    Social History:  Married. Independent but disabled due to medical issues. Wife has health problems and does not drive.  Per reports that he has been smoking Cigars.  He has never used smokeless tobacco. He reports that he drinks alcohol. He reports that he does not use illicit drugs.     Allergies  Allergen Reactions  . Penicillins     REACTION: Hives  . Adhesive [Tape] Other (See Comments)    Shin irritation    Medications Prior to Admission  Medication Sig Dispense Refill  . allopurinol (ZYLOPRIM) 300 MG tablet Take 300 mg by mouth daily.    Marland Kitchen atenolol (TENORMIN) 100 MG tablet Take 100 mg by mouth 2 (two) times daily.    Marland Kitchen b complex vitamins tablet Take 1 tablet by mouth daily.      . busPIRone (BUSPAR) 7.5 MG tablet Take 7.5 mg by mouth 2 (two) times daily.     . Calcium-Magnesium-Zinc (CAL-MAG-ZINC PO) Take by mouth daily.      Marland Kitchen CARTIA XT 120 MG 24 hr capsule Take 120 mg by mouth daily.     . clindamycin (CLEOCIN) 300 MG capsule Take 2 capsules by mouth one hour prior to dental appointment. 2 capsule 1   . digoxin (LANOXIN) 0.25 MG tablet Take 1 tablet (0.25 mg total) by mouth daily. 90 tablet 3  . enoxaparin (LOVENOX) 100 MG/ML injection Inject 1 mL (100 mg total) into the skin every 12 (twelve) hours. 20 Syringe 0  . furosemide (LASIX) 40 MG tablet Take 20-40 mg by mouth daily as needed for fluid.    . methocarbamol (ROBAXIN) 500 MG tablet Take 500 mg by mouth 3 (three) times  daily.     . ONE TOUCH ULTRA TEST test strip     . oxyCODONE (OXY IR/ROXICODONE) 5 MG immediate release tablet Take 5 mg by mouth every 4 (four) hours as needed for moderate pain.     . potassium chloride (K-DUR) 10 MEQ tablet Take 10 mEq by mouth 2 (two) times daily.     . temazepam (RESTORIL) 15 MG capsule Take 15 mg by mouth at bedtime as needed for sleep.    Marland Kitchen warfarin (COUMADIN) 5 MG tablet Take 1 to 1.5 tablets by mouth daily as directed by coumadin clinic (Patient taking differently: Take 5-7.5 mg by mouth See admin instructions. 7.5mg  on Wednesdays, 5mg  all other days) 120 tablet 1    Home: Home Living Family/patient expects to be discharged to:: Private residence Living Arrangements: Spouse/significant other Additional Comments: pt too confused to complete; no family present  Functional History: Prior Function Level of Independence: Needs assistance Gait / Transfers Assistance Needed: per pt walked household distances only; used motorized cart at the store Comments: pt confused and ?accuracy Functional Status:  Mobility: Bed Mobility Overal bed mobility: Needs Assistance, +2 for physical assistance, + 2 for safety/equipment Bed Mobility: Supine to Sit Supine to sit: Mod assist, +2 for physical assistance, +2 for safety/equipment, HOB elevated General bed mobility comments: HOB to maintain respiratory status; pt required assist with legs over EOB and bringing hips to sit at edge (pt was helping) Transfers Overall transfer level: Needs assistance Equipment used: 2 person hand held assist Transfers: Sit  to/from Stand, Stand Pivot Transfers Sit to Stand: Min assist, +2 physical assistance, +2 safety/equipment Stand pivot transfers: Mod assist, +2 physical assistance, +2 safety/equipment General transfer comment: as began to step-pivot to chair, pt with increasing posterior lean. He attempted to compensate with hip flexion and bringing torso forward, making stepping more difficult      ADL:    Cognition: Cognition Overall Cognitive Status: No family/caregiver present to determine baseline cognitive functioning Orientation Level: Oriented to person Cognition Arousal/Alertness: Awake/alert Behavior During Therapy: WFL for tasks assessed/performed Overall Cognitive Status: No family/caregiver present to determine baseline cognitive functioning  Blood pressure 102/64, pulse 90, temperature 97.5 F (36.4 C), temperature source Axillary, resp. rate 37, height 5\' 1"  (1.549 m), weight 101.1 kg (222 lb 14.2 oz), SpO2 98 %. Physical Exam  Nursing note and vitals reviewed. Constitutional: He is oriented to person, place, and time. He appears well-developed and well-nourished.  Morbidly obese male up in chair. NAD.  HENT:  Head: Normocephalic and atraumatic.  Eyes: Conjunctivae are normal. Pupils are equal, round, and reactive to light.  Neck: Normal range of motion. Neck supple.  Cardiovascular: Normal rate.  An irregular rhythm present.  Murmur heard. Mechanical heart valve sounds  Respiratory: Effort normal. No respiratory distress. He has decreased breath sounds. He has no wheezes.  Right chest tube in place  GI: Soft. Bowel sounds are normal. He exhibits no distension.  Genitourinary:  Foley in place with blood tinged urine.   Neurological: He is alert and oriented to person, place, and time.  Speech clear and able to answer orientation with minimal cues. . Has poor insight with inappropriate comments about his guns being locked up.  He was able to follow basic commands without  difficulty.  Moves all four.   Skin: Skin is warm and dry.  Bilateral shins with stasis dermatitis.   Motor strength is 4/5 bilateral deltoid, biceps, triceps, grip 3 minus bilateral hip flexor 4 minus bilateral knee  extensor 4 bilateral ankle dorsiflexor plantar flexor Sensation intact to light touch in bilateral upper and lower limbs  Results for orders placed or performed during the hospital encounter of 01/24/15 (from the past 24 hour(s))  Brain natriuretic peptide     Status: Abnormal   Collection Time: 02/04/15 10:33 AM  Result Value Ref Range   B Natriuretic Peptide 557.7 (H) 0.0 - 100.0 pg/mL  Basic metabolic panel     Status: Abnormal   Collection Time: 02/04/15 10:33 AM  Result Value Ref Range   Sodium 139 135 - 145 mmol/L   Potassium 3.2 (L) 3.5 - 5.1 mmol/L   Chloride 93 (L) 101 - 111 mmol/L   CO2 38 (H) 22 - 32 mmol/L   Glucose, Bld 144 (H) 65 - 99 mg/dL   BUN 28 (H) 6 - 20 mg/dL   Creatinine, Ser 0.91 0.61 - 1.24 mg/dL   Calcium 8.0 (L) 8.9 - 10.3 mg/dL   GFR calc non Af Amer >60 >60 mL/min   GFR calc Af Amer >60 >60 mL/min   Anion gap 8 5 - 15  Glucose, capillary     Status: Abnormal   Collection Time: 02/04/15 12:27 PM  Result Value Ref Range   Glucose-Capillary 115 (H) 65 - 99 mg/dL   Comment 1 Capillary Specimen    Comment 2 Notify RN   Glucose, capillary     Status: Abnormal   Collection Time: 02/04/15  4:24 PM  Result Value Ref Range   Glucose-Capillary 118 (H) 65 - 99 mg/dL  Glucose, capillary     Status: None   Collection Time: 02/04/15  9:54 PM  Result Value Ref Range   Glucose-Capillary 74 65 - 99 mg/dL   Comment 1 Notify RN    Comment 2 Document in Chart   Glucose, capillary     Status: None   Collection Time: 02/05/15  3:59 AM  Result Value Ref Range   Glucose-Capillary 69 65 - 99 mg/dL   Comment 1 Capillary Specimen   Protime-INR     Status: Abnormal   Collection Time: 02/05/15  5:21 AM  Result Value Ref Range   Prothrombin Time 21.9 (H)  11.6 - 15.2 seconds   INR 1.93 (H) 0.00 - 1.49  CBC     Status: Abnormal   Collection Time: 02/05/15  5:21 AM  Result Value Ref Range   WBC 12.2 (H) 4.0 - 10.5 K/uL   RBC 4.15 (L) 4.22 - 5.81 MIL/uL   Hemoglobin 10.7 (L) 13.0 - 17.0 g/dL   HCT 36.6 (L) 39.0 - 52.0 %   MCV 88.2 78.0 - 100.0 fL   MCH 25.8 (L) 26.0 - 34.0 pg   MCHC 29.2 (L) 30.0 - 36.0 g/dL   RDW 17.6 (H) 11.5 - 15.5 %   Platelets 211 150 - 400 K/uL  Comprehensive metabolic panel     Status: Abnormal   Collection Time: 02/05/15  5:21 AM  Result Value Ref Range   Sodium 143 135 - 145 mmol/L   Potassium 3.3 (L) 3.5 - 5.1 mmol/L   Chloride 96 (L) 101 - 111 mmol/L   CO2 39 (H) 22 - 32 mmol/L   Glucose, Bld 75 65 - 99 mg/dL   BUN 24 (H) 6 - 20 mg/dL   Creatinine, Ser 0.82 0.61 - 1.24 mg/dL   Calcium 7.8 (L) 8.9 - 10.3 mg/dL   Total Protein 4.7 (L) 6.5 - 8.1 g/dL   Albumin 2.0 (L) 3.5 - 5.0 g/dL  AST 20 15 - 41 U/L   ALT 14 (L) 17 - 63 U/L   Alkaline Phosphatase 37 (L) 38 - 126 U/L   Total Bilirubin 0.8 0.3 - 1.2 mg/dL   GFR calc non Af Amer >60 >60 mL/min   GFR calc Af Amer >60 >60 mL/min   Anion gap 8 5 - 15  Prealbumin     Status: Abnormal   Collection Time: 02/05/15  5:21 AM  Result Value Ref Range   Prealbumin 14.1 (L) 18 - 38 mg/dL   Dg Chest Port 1 View  02/05/2015   CLINICAL DATA:  54 year old male with congestive heart failure. Mitral valve replacement recently. Initial encounter.  EXAM: PORTABLE CHEST - 1 VIEW  COMPARISON:  02/03/2015 and earlier.  FINDINGS: Portable AP upright view at 0642 hours. Two right chest tubes remain in place. Stable right PICC line. Stable lung volumes and no pneumothorax or pleural effusion identified. Stable cardiomegaly and mediastinal contours. Prosthetic cardiac valve and sternotomy wires re- identified. Stable vascular congestion without overt edema. Decreased patchy opacity in the right lung since 02/02/2015. No areas of worsening ventilation.  IMPRESSION: 1.  Stable lines  and tubes. 2. No pneumothorax.  Stable mild vascular congestion.   Electronically Signed   By: Genevie Ann M.D.   On: 02/05/2015 07:43    Assessment/Plan: Diagnosis: Deconditioning after mitral valve replacement 01/24/2015 in a patient with morbid obesity and postoperative delirium 1. Does the need for close, 24 hr/day medical supervision in concert with the patient's rehab needs make it unreasonable for this patient to be served in a less intensive setting? Yes 2. Co-Morbidities requiring supervision/potential complications: Diabetes, congestive heart failure, status post aortic valve replacement 1999 3. Due to bladder management, bowel management, safety, skin/wound care, disease management, medication administration, pain management and patient education, does the patient require 24 hr/day rehab nursing? Yes 4. Does the patient require coordinated care of a physician, rehab nurse, PT (1-2 hrs/day, 5 days/week), OT (1-2 hrs/day, 5 days/week) and SLP (0.5-1 hrs/day, 5 days/week) to address physical and functional deficits in the context of the above medical diagnosis(es)? Yes Addressing deficits in the following areas: balance, endurance, locomotion, strength, transferring, bowel/bladder control, bathing, dressing, feeding, grooming, toileting, cognition and psychosocial support 5. Can the patient actively participate in an intensive therapy program of at least 3 hrs of therapy per day at least 5 days per week? Potentially 6. The potential for patient to make measurable gains while on inpatient rehab is good 7. Anticipated functional outcomes upon discharge from inpatient rehab are supervision  with PT, supervision with OT, supervision with SLP. 8. Estimated rehab length of stay to reach the above functional goals is: 10-14 days 9. Does the patient have adequate social supports and living environment to accommodate these discharge functional goals? Yes 10. Anticipated D/C setting: Home 11. Anticipated  post D/C treatments: [place] therapy 12. Overall Rehab/Functional Prognosis: good  RECOMMENDATIONS: This patient's condition is appropriate for continued rehabilitative care in the following setting: CIR Patient has agreed to participate in recommended program. Potentially Note that insurance prior authorization may be required for reimbursement for recommended care.  Comment: Patient needs chest tube out prior to rehabilitation    02/05/2015

## 2015-02-05 NOTE — Progress Notes (Signed)
Physical Therapy Treatment Patient Details Name: Lance Cook MRN: [id number] DOB: Sep 09, 1960 Today's Date: 02/05/2015    History of Present Illness 54 y.o. M with extensive cardiac hx including severe MR. 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 Roxy Manns via rt thoracotomy. Following procedure, remained hypoxemic and had edema vs ARDS on CXR. Remained intubated until 6/3. PMHx- morbid obesity, DM, CVA age 63, CHF, aortic dissection, AVR, OSA    PT Comments    Pt progressed well with ambulation. 100 ft x 3 with seated rests. SaO2 95-99 on 2L throughout. Remains unsteady and required RW and min assist. Will continue to follow and assess d/c needs.  Follow Up Recommendations  CIR;Supervision/Assistance - 24 hour     Equipment Recommendations   (TBA)    Recommendations for Other Services       Precautions / Restrictions Precautions Precautions: Fall    Mobility  Bed Mobility Overal bed mobility: Needs Assistance;+2 for physical assistance;+ 2 for safety/equipment Bed Mobility: Supine to Sit     Supine to sit: Min assist;HOB elevated     General bed mobility comments: Pt  up in chair and returned to chair  Transfers Overall transfer level: Needs assistance Equipment used: Rolling walker (2 wheeled) Transfers: Sit to/from Stand Sit to Stand: Min assist;+2 safety/equipment Stand pivot transfers: Mod assist;+2 physical assistance;+2 safety/equipment       General transfer comment: vc for safe use of RW; transfer x 3 with cues needed each time  Ambulation/Gait Ambulation/Gait assistance: Min assist;+2 safety/equipment Ambulation Distance (Feet): 100 Feet (seated rest, 100, sit, 100) Assistive device: Rolling walker (2 wheeled) Gait Pattern/deviations: Step-through pattern;Decreased stride length;Staggering left;Drifts right/left;Trunk flexed Gait velocity: decr   General Gait Details: flexed posture, vc for safe use of RW; steady assist with occasional stagger/drift to  his left   Stairs            Wheelchair Mobility    Modified Rankin (Stroke Patients Only)       Balance Overall balance assessment: Needs assistance Sitting-balance support: No upper extremity supported;Feet unsupported Sitting balance-Leahy Scale: Poor Sitting balance - Comments: minguard assist jue to slight post lean Postural control: Posterior lean Standing balance support: Bilateral upper extremity supported Standing balance-Leahy Scale: Poor                      Cognition Arousal/Alertness: Awake/alert Behavior During Therapy: WFL for tasks assessed/performed Overall Cognitive Status: Impaired/Different from baseline Area of Impairment: Safety/judgement         Safety/Judgement: Decreased awareness of safety;Decreased awareness of deficits     General Comments: Pt with intermittent confusion     Exercises      General Comments General comments (skin integrity, edema, etc.): OT finishing her session when pt incontinent of bowels and needed assist to clean pt and safely transfer to [facility]. Plan was to "dove-tail" treatments, however after clean-up pt requested PT return after eating his lunch       Pertinent Vitals/Pain Pain Assessment: No/denies pain Pain Score: 1  Pain Location: Rt chest - incisional pain  Pain Descriptors / Indicators: Dull Pain Intervention(s): Monitored during session    Home Living Family/patient expects to be discharged to:: Private residence Living Arrangements: Spouse/significant other Available Help at Discharge: Family;Available 24 hours/day Type of Home: Mobile home Home Access: Stairs to enter Entrance Stairs-Rails: Right Home Layout: One level   Additional Comments: Pt reports that wife is 4'9" and can oly do minimal lifting.  She has visual deficits and  pt has to help with with visual tasks.  He is the driver in the family.  They have investigated public transportation, but [employer] does not service their area of  the county     Prior Function Level of Independence: Needs assistance  Gait / Transfers Assistance Needed: per pt walked household distances only; used motorized cart at the store ADL's / Homemaking Assistance Needed: Pt reports wife assisted him with LB dressing  Comments: Pt with intermittent confusion.  Wife not present to confirm information    PT Goals (current goals can now be found in the care plan section) Acute Rehab PT Goals Patient Stated Goal: to get better and go home Time For Goal Achievement: 02/16/15 Progress towards PT goals: Progressing toward goals    Frequency  Min 3X/week    PT Plan Current plan remains appropriate    Co-evaluation PT/OT/SLP Co-Evaluation/Treatment: Yes Reason for Co-Treatment: For patient/therapist safety (ICU/multiple lines/impulsive to get to [facility]) PT goals addressed during session: Balance;Mobility/safety with mobility       End of Session Equipment Utilized During Treatment: Oxygen Activity Tolerance: Patient tolerated treatment well Patient left: in chair;with call bell/phone within reach;with nursing/sitter in room;with chair alarm set     Time: 1435-1500 PT Time Calculation (min) (ACUTE ONLY): 25 min  Charges:  $Gait Training: 23-37 mins $Therapeutic Activity: 8-22 mins                    G Codes:      Lance Cook 02-09-2015, 4:48 PM Pager [phone]

## 2015-02-05 NOTE — Progress Notes (Signed)
Pt would like to go on CPAP at 2300. RT will return then.

## 2015-02-05 NOTE — Progress Notes (Signed)
PULMONARY / CRITICAL CARE MEDICINE   Name: Lance Cook MRN: [id number] DOB: Oct 11, 1960    ADMISSION DATE:  01/24/2015 CONSULTATION DATE:  02/05/2015  REFERRING MD :  Roxy Manns  CHIEF COMPLAINT:  Hypoxemia post op  INITIAL PRESENTATION: 54 y.o. M with extensive cardiac hx including severe MR.  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 Roxy Manns via rt thoracotomy.  Following procedure, remained hypoxemic and had edema vs ARDS on CXR.  PCCM consulted for recs. Long pump time. 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 QTc yesterday 419msec. Sitting in chair. D/w Roxy Manns and bedside RN - who feels patient has significantly improved and reports patient slept well last night  VITAL SIGNS: Temp:  [96.4 F (35.8 C)-99.3 F (37.4 C)] 97.5 F (36.4 C) (06/06 0700) Pulse Rate:  [55-130] 85 (06/06 0900) Resp:  [19-37] 22 (06/06 0900) BP: (70-128)/(46-85) 111/80 mmHg (06/06 0900) SpO2:  [95 %-100 %] 100 % (06/06 0902) Weight:  [101.1 kg (222 lb 14.2 oz)] 101.1 kg (222 lb 14.2 oz) (06/06 0600) HEMODYNAMICS:   VENTILATOR SETTINGS:   INTAKE / OUTPUT: Intake/Output      06/05 0701 - 06/06 0700 06/06 0701 - 06/07 0700   P.O. 1640 360   I.V. (mL/kg) 220 (2.2) 20 (0.2)   Other  125   IV Piggyback 150 50   Total Intake(mL/kg) 2010 (19.9) 555 (5.5)   Urine (mL/kg/hr) 1925 (0.8) 125 (0.4)   Stool     Chest Tube 275 (0.1)    Total Output 2200 125   Net -190 +430          PHYSICAL EXAMINATION: General: awake in chair . Looks deconditioned HENT: NCAT OP clear PULM: CTA B today CV: Irreg irreg, mech S1 GI: soft, nontender Derm: edema extremities noted Neuro: Awake, alert, Calm. Oriented x 2. CAM-ICU test - mildly positive. HE did get some questions and instructions wrong but  redirected himself to right  answer    LABS:   PULMONARY  Recent Labs Lab 01/30/15 0443 01/30/15 1521 01/31/15 0420  PHART  --  7.224*  --   PCO2ART  --  75.5*  --   PO2ART  --  127.0*  --   HCO3  --  30.7*  --   TCO2  --  33  --   O2SAT 75.4 98.0 70.1    CBC  Recent Labs Lab 02/01/15 0400 02/03/15 0450 02/05/15 0521  HGB 10.3* 11.5* 10.7*  HCT 34.4* 39.6 36.6*  WBC 8.2 11.4* 12.2*  PLT 150 224 211    COAGULATION  Recent Labs Lab 02/01/15 0400 02/02/15 0525 02/03/15 0450 02/04/15 0450 02/05/15 0521  INR 1.63* 1.70* 1.90* 2.07* 1.93*    CARDIAC  No results for input(s): TROPONINI in the last 168 hours. No results for input(s): PROBNP in the last 168 hours.   CHEMISTRY  Recent Labs Lab 02/01/15 0400 02/02/15 0525 02/03/15 0450 02/04/15 1033 02/05/15 0521  NA 144 147* 151* 139 143  K 3.3* 3.6 4.0 3.2* 3.3*  CL 100* 102 98* 93* 96*  CO2 34* 35* 40* 38* 39*  GLUCOSE 104* 93 86 144* 75  BUN 35* 36* 31* 28* 24*  CREATININE 0.90 0.90 0.98 0.91 0.82  CALCIUM 7.7* 8.2* 8.8* 8.0* 7.8*   Estimated Creatinine Clearance: 105.8 mL/min (by C-G formula based on Cr of 0.82).  LIVER  Recent Labs Lab 02/01/15 0400 02/02/15 0525 02/03/15 0450 02/04/15 0450 02/05/15 0521  AST  --   --   --   --  20  ALT  --   --   --   --  14*  ALKPHOS  --   --   --   --  37*  BILITOT  --   --   --   --  0.8  PROT  --   --   --   --  4.7*  ALBUMIN  --   --   --   --  2.0*  INR 1.63* 1.70* 1.90* 2.07* 1.93*     INFECTIOUS  Recent Labs Lab 02/01/15 0400  PROCALCITON 0.22     ENDOCRINE CBG (last 3)   Recent Labs  02/04/15 2154 02/05/15 0359 02/05/15 0803  GLUCAP 74 69 81         IMAGING x48h  - image(s) personally visualized  -   highlighted in bold Dg Chest Port 1 View  02/05/2015   CLINICAL DATA:  54 year old male with congestive heart failure. Mitral valve replacement recently. Initial encounter.  EXAM: PORTABLE CHEST - 1 VIEW  COMPARISON:  02/03/2015 and earlier.   FINDINGS: Portable AP upright view at 0642 hours. Two right chest tubes remain in place. Stable right PICC line. Stable lung volumes and no pneumothorax or pleural effusion identified. Stable cardiomegaly and mediastinal contours. Prosthetic cardiac valve and sternotomy wires re- identified. Stable vascular congestion without overt edema. Decreased patchy opacity in the right lung since 02/02/2015. No areas of worsening ventilation.  IMPRESSION: 1.  Stable lines and tubes. 2. No pneumothorax.  Stable mild vascular congestion.   Electronically Signed   By: Genevie Ann M.D.   On: 02/05/2015 07:43          ASSESSMENT / PLAN:  PULMONARY ETT 5/25 >> 6/03 A:  Acute hypoxic resp failure > improving, now on 2-3 L [place] Pulm infiltrates (edema at this point) - improving  OSA  - no acute issues. Delirum immproving  P:   CPAP via home machine/mask  Supp O2 to maintain SpO2 > 92 % Airway hygiene - encourage cough Dc  nebulized steroid    CARDIOVASCULAR A:  Cardiogenic shock, resolved Severe MR - s/p minimally invasive MVR 01/24/15 H/o CAD, HTN, HLD Chronic dCHF Hx PAF  - 3rd spacing +  P:  Hemodynamic mgmt per TCTS  RENAL A:   AKI with hypernatremia , resolved  Active issue  - Hypokalemia  - on repletion  - Anasarca improving    P:   Replete K,  Chec mag and phos and Monitor BMET intermittently Monitor I/Os Lasix po per CVTS   Allow free water intake po  GASTROINTESTINAL A:   GERD Post extubation dysphagia P:   NPO post ext SLP eval 6/04, pending  HEMATOLOGIC A:   ICU acquired anemia > mild + P:  DVT px: warfarin (mech valve) Monitor CBC intermittently Transfuse per usual ICU guidelines  - PRBC for hgb <7gm%  INFECTIOUS A:   C diff colitis 01/30/15 - on active Rx    - repeat PCR 01/31/15 - negative (likely false negative)  - diarrhea improved per RN 02/05/15 - can come off flexiseal per RN  P:   Aztreonam start date 5/27 >> 6/02 Vancomycin, start date 5/25 >>  6/03 Metronidazole 5/31 >>  (Complete 14 days rx for uncomplicated C diff)  ENDOCRINE A:   DM2, controlled   - 02/05/15-  sugars controlled per RN. On episode of hypoglycemia P:   Cont SSI DM diet  NEUROLOGIC A:   ICU delirium with hallucinations and insomnia - > worse 6/5    - rapidly improved after risperdal 02/04/15 but still not out of woods. QTc < 518msec 02/04/15  P:   RASS goal: 0 Cont current meds Risperdal 0.5 bid,  Check QTc on EKG 02/06/15 Trazodone qHS prn sleep      Dr. Brand Males, M.D., [facility].C.P Pulmonary and Critical Care Medicine Staff Physician [facility] Pager: [phone], If no answer or between  15:00h - 7:00h: call [phone]  02/05/2015 10:00 AM

## 2015-02-06 LAB — GLUCOSE, CAPILLARY
GLUCOSE-CAPILLARY: 87 mg/dL (ref 65–99)
GLUCOSE-CAPILLARY: 126 mg/dL — AB (ref 65–99)
GLUCOSE-CAPILLARY: 89 mg/dL (ref 65–99)
Glucose-Capillary: 84 mg/dL (ref 65–99)

## 2015-02-06 LAB — BASIC METABOLIC PANEL
Anion gap: 8 (ref 5–15)
BUN: 21 mg/dL — AB (ref 6–20)
CALCIUM: 7.5 mg/dL — AB (ref 8.9–10.3)
CHLORIDE: 96 mmol/L — AB (ref 101–111)
CO2: 35 mmol/L — ABNORMAL HIGH (ref 22–32)
CREATININE: 0.78 mg/dL (ref 0.61–1.24)
GFR calc Af Amer: >60 mL/min (ref >60)
GFR calc non Af Amer: >60 mL/min (ref >60)
Glucose, Bld: 89 mg/dL (ref 65–99)
POTASSIUM: 3.5 mmol/L (ref 3.5–5.1)
Sodium: 139 mmol/L (ref 135–145)

## 2015-02-06 LAB — PHOSPHORUS: PHOSPHORUS: 2.4 mg/dL — AB (ref 2.5–4.6)

## 2015-02-06 LAB — PROTIME-INR
INR: 2.23 — ABNORMAL HIGH (ref 0.00–1.49)
PROTHROMBIN TIME: 24.5 s — AB (ref 11.6–15.2)

## 2015-02-06 LAB — MAGNESIUM: MAGNESIUM: 1.8 mg/dL (ref 1.7–2.4)

## 2015-02-06 MED ORDER — POTASSIUM CHLORIDE ER 10 MEQ PO TBCR
40.0000 meq | EXTENDED_RELEASE_TABLET | Freq: Two times a day (BID) | ORAL | Status: DC
  Administered 2015-02-06 (×2): 40 meq via ORAL
  Filled 2015-02-06 (×4): qty 4

## 2015-02-06 MED ORDER — POTASSIUM CHLORIDE 10 MEQ/50ML IV SOLN
10.0000 meq | INTRAVENOUS | Status: AC | PRN
  Administered 2015-02-06 (×3): 10 meq via INTRAVENOUS
  Filled 2015-02-06 (×2): qty 50

## 2015-02-06 MED ORDER — COLLAGENASE 250 UNIT/GM EX OINT
TOPICAL_OINTMENT | Freq: Every day | CUTANEOUS | Status: DC
  Administered 2015-02-06 – 2015-02-08 (×3): via TOPICAL
  Filled 2015-02-06: qty 30

## 2015-02-06 MED ORDER — MAGNESIUM SULFATE 2 GM/50ML IV SOLN
2.0000 g | Freq: Once | INTRAVENOUS | Status: AC
  Administered 2015-02-06: 2 g via INTRAVENOUS
  Filled 2015-02-06: qty 50

## 2015-02-06 MED ORDER — LEVALBUTEROL HCL 0.63 MG/3ML IN NEBU: 0.6300 mg | INHALATION_SOLUTION | RESPIRATORY_TRACT | Status: DC | PRN

## 2015-02-06 MED ORDER — ASPIRIN EC 81 MG PO TBEC
81.0000 mg | DELAYED_RELEASE_TABLET | Freq: Every day | ORAL | Status: DC
  Administered 2015-02-06 – 2015-02-08 (×3): 81 mg via ORAL
  Filled 2015-02-06 (×3): qty 1

## 2015-02-06 MED ORDER — DEXTROSE 5 % IV SOLN
10.0000 mmol | Freq: Once | INTRAVENOUS | Status: AC
  Administered 2015-02-06: 10 mmol via INTRAVENOUS
  Filled 2015-02-06: qty 3.33

## 2015-02-06 NOTE — Progress Notes (Signed)
CT surgery p.m. Rounds  Patient examined and record reviewed.Hemodynamics stable,labs satisfactory.Patient had stable day.Continue current care. Tharon Aquas Trigt III 02/06/2015

## 2015-02-06 NOTE — Consult Note (Signed)
WOC wound consult note Reason for Consult: evaluation of sacrum Wound type: deep in gluteal cleft and moisture has been an issue.  I feel this is a fissure related to moisture rather than pressure. Pressure Ulcer POA: No Measurement: 1.7cm x 2.0cm x 0.1cm  Wound bed:50% yellow/50% soft black  Drainage (amount, consistency, odor) none noted at the time of my assessment Periwound: intact  Dressing procedure/placement/frequency: Add enzymatic debridement ointment to the wound bed, apply dry dressing into the gluteal cleft.  Monitor for changes.  Turn and reposition frequently.   Discussed POC with patient and bedside nurse.  Re consult if needed, will not follow at this time. Thanks  Jakhi Dishman Kellogg, [place] [phone])

## 2015-02-06 NOTE — Progress Notes (Signed)
Pt placed on home CPAP with assistance. Water chamber filled and 2L O2 bled in. RT will monitor

## 2015-02-06 NOTE — Care Management Note (Signed)
Case Management Note  Patient Details  Name: Lance Cook MRN: [id number] Date of Birth: 06/23/1961  Subjective/Objective:   Remains extubated.  Weak. CIR looking at but due to insurance, rehab feels there is strong chance he will be turned down.  SW referral placed.                  Action/Plan:   Expected Discharge Date:                  Expected Discharge Plan:  Skilled Nursing Facility  In-House Referral:  Clinical Social Work  Discharge planning Services  CM Consult  Post Acute Care Choice:    Choice offered to:     DME Arranged:    DME Agency:     HH Arranged:    [place] Agency:     Status of Service:  In process, will continue to follow  Medicare Important Message Given:  Yes Date Medicare IM Given:  02/06/15 Medicare IM give by:  [name], RNBSN -  Date Additional Medicare IM Given:    Additional Medicare Important Message give by:     If discussed at [place] of Stay Meetings, dates discussed:    Additional Comments:  Vergie Living, RN 02/06/2015, 1:59 PM

## 2015-02-06 NOTE — Progress Notes (Signed)
I met with pt at bedside and then his wife in waiting room. I discussed the possibility of an inpt rehab admission vs SNF depending on [facility] approval. If unable to be admitted to inpt rehab, pt and his wife are open to SNF at [facility]. I will  follow up tomorrow. [phone]

## 2015-02-06 NOTE — Progress Notes (Signed)
PULMONARY / CRITICAL CARE MEDICINE   Name: Lance Cook MRN: [id number] DOB: [birth year]    ADMISSION DATE:  01/24/2015 CONSULTATION DATE:  02/06/2015  REFERRING MD :  Roxy Manns  CHIEF COMPLAINT:  Hypoxemia post op  INITIAL PRESENTATION: 53 y.o. M with extensive cardiac hx including severe MR.  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 Roxy Manns via rt thoracotomy.  Following procedure, remained hypoxemic and had edema vs ARDS on CXR.  PCCM consulted for recs. Long pump time. 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 QTc yesterday 441msec. Sitting in chair. D/w Roxy Manns and bedside RN - who feels patient has significantly improved and reports patient slept well last night   SUBJECTIVE/OVERNIGHT/INTERVAL HX 02/06/15: hx from RN: cntinues to get better. Walked with PT. No diarrhea. Chest tubes out. Possible dc to LTAC. PAtient says his confusion is better and he is feeling better  VITAL SIGNS: Temp:  [97 F (36.1 C)-98.6 F (37 C)] 97.7 F (36.5 C) (06/07 0837) Pulse Rate:  [68-94] 75 (06/07 1000) Resp:  [15-35] 20 (06/07 1000) BP: (84-123)/(44-85) 115/64 mmHg (06/07 1000) SpO2:  [84 %-100 %] 100 % (06/07 1000) Weight:  [102.513 kg (226 lb)] 102.513 kg (226 lb) (06/07 0400) HEMODYNAMICS:   VENTILATOR SETTINGS:   INTAKE / OUTPUT: Intake/Output      06/06 0701 - 06/07 0700 06/07 0701 - 06/08 0700   P.O. 1080    I.V. (mL/kg) 250 (2.4) 30 (0.3)   Other 125    IV Piggyback 300 50   Total Intake(mL/kg) 1755 (17.1) 80 (0.8)   Urine (mL/kg/hr) 1375 (0.6) 200 (0.6)   Chest Tube 40 (0)    Total Output 1415 200   Net +340 -120        Urine Occurrence 1 x      PHYSICAL EXAMINATION: General: awake in bd. WAtching TV. Said he talked to wife on phone HENT: NCAT OP clear PULM: CTA B today CV: Irreg irreg, mech S1 GI: soft, nontender Derm: edema  extremities noted Neuro: Awake, alert, Calm. Oriented x 3. CAM-ICU test - negative. Moved all 4s  LABS:   PULMONARY  Recent Labs Lab 01/30/15 1521 01/31/15 0420  PHART 7.224*  --   PCO2ART 75.5*  --   PO2ART 127.0*  --   HCO3 30.7*  --   TCO2 33  --   O2SAT 98.0 70.1    CBC  Recent Labs Lab 02/01/15 0400 02/03/15 0450 02/05/15 0521  HGB 10.3* 11.5* 10.7*  HCT 34.4* 39.6 36.6*  WBC 8.2 11.4* 12.2*  PLT 150 224 211    COAGULATION  Recent Labs Lab 02/02/15 0525 02/03/15 0450 02/04/15 0450 02/05/15 0521 02/06/15 0410  INR 1.70* 1.90* 2.07* 1.93* 2.23*    CARDIAC  No results for input(s): TROPONINI in the last 168 hours. No results for input(s): PROBNP in the last 168 hours.   CHEMISTRY  Recent Labs Lab 02/02/15 0525 02/03/15 0450 02/04/15 1033 02/05/15 0521 02/06/15 0410  NA 147* 151* 139 143 139  K 3.6 4.0 3.2* 3.3* 3.5  CL 102 98* 93* 96* 96*  CO2 35* 40* 38* 39* 35*  GLUCOSE 93 86 144* 75 89  BUN 36* 31* 28* 24* 21*  CREATININE 0.90 0.98 0.91 0.82 0.78  CALCIUM 8.2* 8.8* 8.0* 7.8* 7.5*  MG  --   --   --   --  1.8  PHOS  --   --   --   --  2.4*   Estimated Creatinine Clearance: 109.4 mL/min (by C-G formula based on Cr of 0.78).   LIVER  Recent Labs Lab 02/02/15 0525 02/03/15 0450 02/04/15 0450 02/05/15 0521 02/06/15 0410  AST  --   --   --  20  --   ALT  --   --   --  14*  --   ALKPHOS  --   --   --  37*  --   BILITOT  --   --   --  0.8  --   PROT  --   --   --  4.7*  --   ALBUMIN  --   --   --  2.0*  --   INR 1.70* 1.90* 2.07* 1.93* 2.23*     INFECTIOUS  Recent Labs Lab 02/01/15 0400  PROCALCITON 0.22     ENDOCRINE CBG (last 3)   Recent Labs  02/05/15 1526 02/05/15 2140 02/06/15 0832  GLUCAP 100* 99 84     EKG - personally viewed trace - QTc < 536msec    IMAGING x48h  - image(s) personally visualized  -   highlighted in bold Dg Chest Port 1 View  02/05/2015   CLINICAL DATA:  54 year old male with  congestive heart failure. Mitral valve replacement recently. Initial encounter.  EXAM: PORTABLE CHEST - 1 VIEW  COMPARISON:  02/03/2015 and earlier.  FINDINGS: Portable AP upright view at 0642 hours. Two right chest tubes remain in place. Stable right PICC line. Stable lung volumes and no pneumothorax or pleural effusion identified. Stable cardiomegaly and mediastinal contours. Prosthetic cardiac valve and sternotomy wires re- identified. Stable vascular congestion without overt edema. Decreased patchy opacity in the right lung since 02/02/2015. No areas of worsening ventilation.  IMPRESSION: 1.  Stable lines and tubes. 2. No pneumothorax.  Stable mild vascular congestion.   Electronically Signed   By: Genevie Ann M.D.   On: 02/05/2015 07:43          ASSESSMENT / PLAN:  PULMONARY ETT 5/25 >> 6/03 A:  Acute hypoxic resp failure > improving, now on 2-3 L [place] Pulm infiltrates (edema at this point) - improving  OSA  - no acute issues. Delirum immproving/resolved  P:   CPAP via home machine/mask  Supp O2 to maintain SpO2 > 92 % Airway hygiene - encourage cough Dc  nebulized steroid    CARDIOVASCULAR A:  Cardiogenic shock, resolved Severe MR - s/p minimally invasive MVR 01/24/15 H/o CAD, HTN, HLD Chronic dCHF Hx PAF  - 3rd spacing + - improving  P:  Hemodynamic mgmt per TCTS  RENAL A:   AKI with hypernatremia , resolved  Active issue  - Anasarca improving   - Very mild Hypokalemia Hypomagnesemia Hypophosphatemia    P:   Replete K, - goal > 4; done by ? eMD Replete mag and phos 02/06/15 Monitor I/Os Lasix po per CVTS   Allow free water intake po  GASTROINTESTINAL A:   GERD Post extubation dysphagia P:   NPO post ext SLP eval 6/04, pending  HEMATOLOGIC A:   ICU acquired anemia > mild + P:  DVT px: warfarin (mech valve) Monitor CBC intermittently Transfuse per usual ICU guidelines  - PRBC for hgb <7gm%  INFECTIOUS A:   C diff colitis 01/30/15 - on active Rx     - repeat PCR 01/31/15 - negative (likely false negative)  - diarrhea improved per  RN 02/06/15 -s/p flexiseal ending 02/05/15   P:   Aztreonam start date 5/27 >> 6/02 Vancomycin, start date 5/25 >> 6/03 Metronidazole 5/31 >>  (Complete 14 days rx for uncomplicated C diff)  ENDOCRINE A:   DM2, controlled   - 02/05/15- sugars controlled per RN. On episode of hypoglycemia P:   Cont SSI DM diet  NEUROLOGIC A:   ICU delirium with hallucinations and insomnia - > worse 6/5 and started on risperdal   -  Nearly resolved 02/06/15. QTc still < 500 msec  P:   RASS goal: 0 Cont current meds Risperdal 0.5 bid,  Check QTc on EKG 02/08/15 Trazodone qHS prn sleep   GLOBAL PCCM will see again Thursday 02/08/15    Dr. Brand Males, M.D., F.C.C.P Pulmonary and Critical Care Medicine Staff Physician [facility] Pager: [phone], If no answer or between  15:00h - 7:00h: call [phone]  02/06/2015 10:20 AM

## 2015-02-06 NOTE — Plan of Care (Signed)
Problem: Phase II Progression Outcomes Goal: Date pt extubated/weaned off vent Outcome: Completed/Met Date Met:  02/06/15 02-02-15

## 2015-02-06 NOTE — Progress Notes (Signed)
Nutrition Follow-up  DOCUMENTATION CODES:  Morbid obesity  INTERVENTION:   (No nutrition intervention warranted at this time)  NUTRITION DIAGNOSIS:  Inadequate oral intake, resolved  GOAL:  Patient will meet greater than or equal to 90% of their needs, met  MONITOR:  PO intake, Labs, Weight trends, I & O's, Skin  ASSESSMENT: [name] with with extensive cardiac hx including severe MR; following surgical procedure, pt remained hypoxemic and had edema vs ARDS on CXR.  Patient s/p procedure 5/25: MITRAL VALVE REPLACEMENT (RIGHT)  Patient extubated 6/3.  TF (Vital High Protein formula) discontinued.  S/p swallow evaluation 6/4.  Diet advanced to Heart Healthy/Carbohydrate Modified.  Per RN, pt eating very well.  Height:  Ht Readings from Last 1 Encounters:  01/24/15 5' 1"  (1.549 m)    Weight:  Wt Readings from Last 1 Encounters:  02/06/15 226 lb (102.513 kg)    Ideal Body Weight:  51 kg  Wt Readings from Last 10 Encounters:  02/06/15 226 lb (102.513 kg)  01/22/15 244 lb 12.8 oz (111.041 kg)  01/16/15 237 lb (107.502 kg)  12/25/14 237 lb (107.502 kg)  12/18/14 240 lb (108.863 kg)  11/13/14 244 lb (110.678 kg)  10/06/14 242 lb 14.4 oz (110.179 kg)  05/23/14 230 lb (104.327 kg)  04/14/14 231 lb (104.781 kg)  04/02/14 233 lb 14.5 oz (106.1 kg)    BMI:  Body mass index is 42.72 kg/(m^2).  Re-estimated Nutritional Needs:  Kcal:  2200-2400  Protein:  120-130 gm  Fluid:  per MD  Skin:   (Incision on R groin, R chest, back mid skin tears, +3 generalized edema)  Diet Order:  Diet heart healthy/carb modified Room service appropriate?: Yes; Fluid consistency:: Thin  EDUCATION NEEDS:  No education needs identified at this time   Intake/Output Summary (Last 24 hours) at 02/06/15 1227 Last data filed at 02/06/15 1000  Gross per 24 hour  Intake    850 ml  Output   1791 ml  Net   -941 ml    Last BM:  6/7  Arthur Holms, RD, LDN Pager #:  [phone] After-Hours Pager #: [phone]

## 2015-02-06 NOTE — Progress Notes (Signed)
[address]       [place] [address]             [phone]        CARDIOTHORACIC SURGERY PROGRESS NOTE   R13 Days Post-Op Procedure(s) (LRB): TRANSESOPHAGEAL ECHOCARDIOGRAM (TEE) (N/A) Re-Operation, MINIMALLY INVASIVE MITRAL VALVE (MV) REPLACEMENT. (Right)  Subjective: Looks good.  Confusion resolving.  Didn't sleep as well last night because he was up going to the bathroom.  Diarrhea seems to be resolving.  No abdominal pain. Ambulated remarkably well yesterday w/ assistance  Objective: Vital signs: BP Readings from Last 1 Encounters:  02/06/15 101/66   Pulse Readings from Last 1 Encounters:  02/06/15 80   Resp Readings from Last 1 Encounters:  02/06/15 31   Temp Readings from Last 1 Encounters:  02/06/15 98 F (36.7 C) Oral    Hemodynamics:    Physical Exam:  Rhythm:   Afib w/ controlled rate  Breath sounds: clear  Heart sounds:  irregular  Incisions:  Clean and dry, mild skin edge necrosis right groin incision  Abdomen:  Soft, non-distended, non-tender  Extremities:  Warm, well-perfused  Chest tubes:  Low volume thin serosanguinous output, no air leak   Intake/Output from previous day: 06/06 0701 - 06/07 0700 In: 1745 [P.O.:1080; I.V.:240; IV Piggyback:300] Out: 1415 [Urine:1375; Chest Tube:40] Intake/Output this shift:    Lab Results:  CBC: Recent Labs  02/05/15 0521  WBC 12.2*  HGB 10.7*  HCT 36.6*  PLT 211    BMET:  Recent Labs  02/05/15 0521 02/06/15 0410  NA 143 139  K 3.3* 3.5  CL 96* 96*  CO2 39* 35*  GLUCOSE 75 89  BUN 24* 21*  CREATININE 0.82 0.78  CALCIUM 7.8* 7.5*     PT/INR:   Recent Labs  02/06/15 0410  LABPROT 24.5*  INR 2.23*    CBG (last 3)   Recent Labs  02/05/15 1126 02/05/15 1526 02/05/15 2140  GLUCAP 90 100* 99    ABG    Component Value Date/Time   PHART 7.224* 01/30/2015 1521   PCO2ART 75.5* 01/30/2015 1521   PO2ART 127.0* 01/30/2015 1521   HCO3 30.7* 01/30/2015  1521   TCO2 33 01/30/2015 1521   ACIDBASEDEF 1.0 01/26/2015 0425   O2SAT 70.1 01/31/2015 0420    CXR: n/a  Assessment/Plan: S/P Procedure(s) (LRB): TRANSESOPHAGEAL ECHOCARDIOGRAM (TEE) (N/A) Re-Operation, MINIMALLY INVASIVE MITRAL VALVE (MV) REPLACEMENT. (Right)  RESP:Doing very well and reports breathing much better than prior to surgery. Continue home CPAP for sleeping. Pleural tube output decreased - will remove today  YI:FOYDXAJ hemodynamically stable in chronic permanent Afib with good rate control. Acute exacerbation of chronic diastolic CHF has resolved, now may be relatively dehydrated  ID:Low grade fevers resolved. Patient has previously been + for C. Diff enterocolitis but follow up negative. Abdominal exam benign.Diarrhea decreased. Started on Flagyl via tube 5/31. Otherwise stable off all other antibiotics.  RENAL:Weight stable well below pre-op baseline. I/O's balanced yesterday. Continue oral lasix at reduced dose.  HEME:Expected post op acute blood loss anemia. INR trending back up on coumadin - continue 7.5 mg/day for now  OIN:OMVEHMCNOB oral diet. Hypokalemia getting replaced.  ENDO:Type II diabetes mellitus, excellent glycemic control on SSI - will continue to hold levemir insulin for now  NEURO:Confusion resolving. Continue Risperdal and trazodone for now  DISP:Anticipate full recovery. Potentially ready for d/c to inpatient rehab service soon.  Continue PT and work on mobility   [employer] 02/06/2015  7:57 AM

## 2015-02-07 ENCOUNTER — Inpatient Hospital Stay (HOSPITAL_COMMUNITY): Payer: Medicare HMO

## 2015-02-07 LAB — CBC
HCT: 34.2 % — ABNORMAL LOW (ref 39.0–52.0)
HEMOGLOBIN: 10.3 g/dL — AB (ref 13.0–17.0)
MCH: 25.9 pg — AB (ref 26.0–34.0)
MCHC: 30.1 g/dL (ref 30.0–36.0)
MCV: 86.1 fL (ref 78.0–100.0)
Platelets: 200 10*3/uL (ref 150–400)
RBC: 3.97 MIL/uL — ABNORMAL LOW (ref 4.22–5.81)
RDW: 17.7 % — AB (ref 11.5–15.5)
WBC: 10.7 10*3/uL — ABNORMAL HIGH (ref 4.0–10.5)

## 2015-02-07 LAB — BASIC METABOLIC PANEL
ANION GAP: 7 (ref 5–15)
BUN: 15 mg/dL (ref 6–20)
CO2: 34 mmol/L — AB (ref 22–32)
Calcium: 8 mg/dL — ABNORMAL LOW (ref 8.9–10.3)
Chloride: 97 mmol/L — ABNORMAL LOW (ref 101–111)
Creatinine, Ser: 0.78 mg/dL (ref 0.61–1.24)
GFR calc non Af Amer: >60 mL/min (ref >60)
GLUCOSE: 85 mg/dL (ref 65–99)
POTASSIUM: 4 mmol/L (ref 3.5–5.1)
SODIUM: 138 mmol/L (ref 135–145)

## 2015-02-07 LAB — GLUCOSE, CAPILLARY
Glucose-Capillary: 91 mg/dL (ref 65–99)
Glucose-Capillary: 81 mg/dL (ref 65–99)
Glucose-Capillary: 115 mg/dL — ABNORMAL HIGH (ref 65–99)
Glucose-Capillary: 84 mg/dL (ref 65–99)

## 2015-02-07 LAB — MAGNESIUM: MAGNESIUM: 1.8 mg/dL (ref 1.7–2.4)

## 2015-02-07 LAB — PHOSPHORUS: Phosphorus: 2.4 mg/dL — ABNORMAL LOW (ref 2.5–4.6)

## 2015-02-07 LAB — PROTIME-INR
INR: 2.47 — AB (ref 0.00–1.49)
PROTHROMBIN TIME: 26.4 s — AB (ref 11.6–15.2)

## 2015-02-07 MED ORDER — POTASSIUM CHLORIDE ER 10 MEQ PO TBCR
20.0000 meq | EXTENDED_RELEASE_TABLET | Freq: Two times a day (BID) | ORAL | Status: DC
  Administered 2015-02-07 – 2015-02-08 (×4): 20 meq via ORAL
  Filled 2015-02-07 (×4): qty 2

## 2015-02-07 MED ORDER — SODIUM CHLORIDE 0.9 % IJ SOLN: 3.0000 mL | Freq: Two times a day (BID) | INTRAMUSCULAR | Status: DC

## 2015-02-07 MED ORDER — SODIUM CHLORIDE 0.9 % IV SOLN: 250.0000 mL | INTRAVENOUS | Status: DC | PRN

## 2015-02-07 MED ORDER — MOVING RIGHT ALONG BOOK
Freq: Once | Status: AC
  Administered 2015-02-07: 09:00:00
  Filled 2015-02-07: qty 1

## 2015-02-07 MED ORDER — SODIUM CHLORIDE 0.9 % IJ SOLN: 3.0000 mL | INTRAMUSCULAR | Status: DC | PRN

## 2015-02-07 NOTE — Progress Notes (Signed)
[id number] Was standing at computer outside room when family member stated pt needed to get out of bathroom and go to bed. Helped pt with wiping and then assisted to bed with rolling walker. Offered to walk with pt as he stated he had not walked today. Pt declined stating he wanted to rest. Helped pt stand again after sitting to try to use urinal. No urine in bottle after he stated he went. Assisted to bed and helped to get comfortable. Discussed with pt that he needs to walk three times a day on this unit and encouraged him to walk with staff later. Will follow up tomorrow. Graylon Good RN BSN 02/07/2015 2:46 PM

## 2015-02-07 NOTE — Clinical Social Work Note (Signed)
Clinical Social Work Assessment  Patient Details  Name: Lance Cook MRN: [id number] Date of Birth: 1961-07-17  Date of referral:  02/07/15               Reason for consult:  Facility Placement                Permission sought to share information with:  Family Supports Permission granted to share information::  Yes, Verbal Permission Granted  Name::     Lance Cook patient's wife  Agency::  SNF admissions  Relationship::     Contact Information:     Housing/Transportation Living arrangements for the past 2 months:  Single Family Home Source of Information:  Spouse Patient Interpreter Needed:  None Criminal Activity/Legal Involvement Pertinent to Current Situation/Hospitalization:  No - Comment as needed Significant Relationships:  Spouse Lives with:  Spouse Do you feel safe going back to the place where you live?  No (Patient's wife states he needs therapy first before returning home.) Need for family participation in patient care:  Yes (Comment)  Care giving concerns: Patient's wife states patient needs therapy in order to return back home.  Social Worker assessment / plan:  Patient was not responding to CSW while trying to speak to him.  Patient's wife arrived when CSW was attempting to complete assessment.  Patient's wife stated she was only going to accept patient going to inpatient rehab.  CSW tried to speak with her to discuss SNF as a back up plan, patient's wife was not interested in hearing what SNF options are available when CSW spoke to her.  Patient's wife stated she wants to make sure he gets into inpatient rehab whatever it takes because she feels inpatient is what he needs to improve.  CSW explained he may not be approved through insurance, patient's wife stated she will talk to insurance and physician to get patient in inpatient rehab.  CSW will continue to follow patient for possible SNF placement.  Employment status:  Disabled (Comment on whether or not currently  receiving Disability) Insurance information:  Programmer, applications PT Recommendations:  [place], Inpatient Rehab Consult Information / Referral to community resources:     Patient/Family's Response to care:  Patient's wife informed rehab intake that she is willing to have patient go to SNF, but she was not interested in SNF when CSW spoke with her.  Patient/Family's Understanding of and Emotional Response to Diagnosis, Current Treatment, and Prognosis:  Patient's wife wants what is best for patient to get better in order to return home.  Emotional Assessment Appearance:  Appears stated age Attitude/Demeanor/Rapport:  Unable to Assess Affect (typically observed):  Unable to Assess Orientation:  Oriented to Self, Oriented to Place, Oriented to Situation Alcohol / Substance use:  Not Applicable Psych involvement (Current and /or in the community):  No (Comment)  Discharge Needs  Concerns to be addressed:  Other (Comment Required (Patient needs some therapy for strengthening.) Readmission within the last 30 days:  No Current discharge risk:  None Barriers to Discharge:  Insurance Authorization   Anell Barr 02/07/2015, 7:06 PM

## 2015-02-07 NOTE — Care Management Note (Addendum)
Case Management Note  Patient Details  Name: Lance Cook MRN: [id number] Date of Birth: [birth year]  Subjective/Objective:        02-07-15 3:50pm [name], RNBSN - [phone] Dr. Roxy Manns updated on bed availability and insurance and to assist wife with understanding that due to insurance and beds may not get inhouse rehab but may be able to go to SNF - depending on insurance -.  Per Dr. Roxy Manns may be ready the first of next week for rehab ST at [facility].   In house rehab - no bed - ?? If insurance will approve.  SW working on SNF ST rehab bed.               Action/Plan:   Expected Discharge Date:                  Expected Discharge Plan:  Skilled Nursing Facility  In-House Referral:  Clinical Social Work  Discharge planning Services  CM Consult  Post Acute Care Choice:    Choice offered to:     DME Arranged:    DME Agency:     HH Arranged:    [place] Agency:     Status of Service:  In process, will continue to follow  Medicare Important Message Given:  Yes Date Medicare IM Given:  02/06/15 Medicare IM give by:  [name], RNBSN -  Date Additional Medicare IM Given:    Additional Medicare Important Message give by:     If discussed at [place] of Stay Meetings, dates discussed:    Additional Comments:  Vergie Living, RN 02/07/2015, 10:19 AM

## 2015-02-07 NOTE — Progress Notes (Signed)
I await insurance decision for rehab venue but I do not have a bed available to admit this pt today. RN CM made aware. [phone]

## 2015-02-07 NOTE — Progress Notes (Signed)
      [address]       [place] [address]             [phone]        CARDIOTHORACIC SURGERY PROGRESS NOTE   R14 Days Post-Op Procedure(s) (LRB): TRANSESOPHAGEAL ECHOCARDIOGRAM (TEE) (N/A) Re-Operation, MINIMALLY INVASIVE MITRAL VALVE (MV) REPLACEMENT. (Right)  Subjective: Feels well.  Strength improving.  Ambulating fairly well.  Has still been incontinent of stool and urine but diarrhea reportedly resolving  Objective: Vital signs: BP Readings from Last 1 Encounters:  02/07/15 118/93   Pulse Readings from Last 1 Encounters:  02/07/15 94   Resp Readings from Last 1 Encounters:  02/07/15 17   Temp Readings from Last 1 Encounters:  02/07/15 98.2 F (36.8 C) Oral    Hemodynamics:    Physical Exam:  Rhythm:   Afib w/ controlled rate  Breath sounds: Fairly clear  Heart sounds:  irregular  Incisions:  Clean and dry, slight skin edge necrosis right groin incision but no signs of infection  Abdomen:  Soft, non-distended, non-tender  Extremities:  Warm, well-perfused    Intake/Output from previous day: 06/07 0701 - 06/08 0700 In: 1270 [P.O.:720; I.V.:240; IV Piggyback:310] Out: 1403 [Urine:1400; Stool:3] Intake/Output this shift:    Lab Results:  CBC: Recent Labs  02/05/15 0521 02/07/15 0300  WBC 12.2* 10.7*  HGB 10.7* 10.3*  HCT 36.6* 34.2*  PLT 211 200    BMET:  Recent Labs  02/06/15 0410 02/07/15 0300  NA 139 138  K 3.5 4.0  CL 96* 97*  CO2 35* 34*  GLUCOSE 89 85  BUN 21* 15  CREATININE 0.78 0.78  CALCIUM 7.5* 8.0*     PT/INR:   Recent Labs  02/07/15 0300  LABPROT 26.4*  INR 2.47*    CBG (last 3)   Recent Labs  02/06/15 1326 02/06/15 1618 02/06/15 2136  GLUCAP 87 126* 89    ABG    Component Value Date/Time   PHART 7.224* 01/30/2015 1521   PCO2ART 75.5* 01/30/2015 1521   PO2ART 127.0* 01/30/2015 1521   HCO3 30.7* 01/30/2015 1521   TCO2 33 01/30/2015 1521   ACIDBASEDEF 1.0 01/26/2015 0425   O2SAT  70.1 01/31/2015 0420    CXR: PORTABLE CHEST - 1 VIEW  COMPARISON: 02/05/2015.  FINDINGS: Interim removal of right chest tubes. Right PICC line in stable position. Tiny right pneumothorax. No tension. Prior median sternotomy and cardiac valve replacement. Stable cardiomegaly. Stable mild bilateral pulmonary interstitial prominence. Tiny right pleural effusion cannot be excluded.  IMPRESSION: 1. Interim removal of right chest tubes. Tiny right pneumothorax. No tension. Right PICC line in stable position. 2. Prior cardiac valve replacement. Stable severe cardiomegaly. 3. Stable mild bilateral pulmonary interstitial prominence. Mild interstitial edema cannot be excluded. Critical Value/emergent results were called by telephone at the time of interpretation on 02/07/2015 at 8:03 am to nurse [facility] verbally acknowledged these results.   Electronically Signed  By: Marcello Moores Register  On: 02/07/2015 08:05  Assessment/Plan: S/P Procedure(s) (LRB): TRANSESOPHAGEAL ECHOCARDIOGRAM (TEE) (N/A) Re-Operation, MINIMALLY INVASIVE MITRAL VALVE (MV) REPLACEMENT. (Right)  Making excellent progress Potentially ready for d/c to inpatient rehab service later today or tomorrow Transfer step down today Continue current plan  Rexene Alberts 02/07/2015 8:37 AM

## 2015-02-07 NOTE — Progress Notes (Addendum)
RT placed pt on his home cpap with 2l O2 bled in.  Sterile water added to humidity chamber. No frays on cord or obvious defects noted.  Machine turns on and appears to be working properly.  Pt is tolerating well at this time.

## 2015-02-07 NOTE — Discharge Summary (Addendum)
[address]       [place] [address]             [phone]              Discharge Summary  Name: Lance Cook DOB: 1960-12-24 54 y.o. MRN: [id number]   Admission Date: 01/24/2015 Discharge Date: 02/08/2015    Admitting Diagnosis: Severe mitral regurgitation History of Bentall aortic root replacement   Discharge Diagnosis:  Severe mitral regurgitation History of Bentall aortic root replacement Chronic permanent atrial fibrillation Expected postop blood loss anemia Postoperative thrombocytopenia Ventilator dependent respiratory failure due to ARDS, acute lung injury Clostridium difficile colitis Acute on chronic diastolic heart failure Deconditioning  Patient Active Problem List   Diagnosis Date Noted  . Hypomagnesemia 02/08/2015  . Encephalopathy acute 02/05/2015  . Hypokalemia 02/05/2015  . Enteritis due to Clostridium difficile 01/30/2015  . HCAP (healthcare-associated pneumonia) 01/29/2015  . Acute respiratory failure with hypoxemia   . ARDS (adult respiratory distress syndrome) 01/27/2015  . 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 50/27/7412  . Chronic diastolic congestive heart failure   . Atelectasis   . History of mechanical aortic valve replacement 12/25/2014  . 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 Severe mitral regurgitation 10/11/2014  . Cellulitis 04/02/2014  . Hypotension 04/02/2014  . Venous insufficiency of both lower extremities 04/02/2014  . Morbid obesity 04/02/2014  . Encounter for therapeutic drug monitoring 02/02/2014  . Mass of left side of neck 08/18/2012  . Head or neck swelling, mass, or lump 07/15/2012  . Long term (current) use of anticoagulants 07/15/2012  . Hypogonadism male 04/08/2012  . History of adenomatous polyp of colon 12/23/2011  . Testicular cancer   . TIA (transient ischemic attack)   . OSA (obstructive sleep apnea)   .  Lumbar spinal stenosis 10/08/2011  . Chronic LBP 10/08/2011  . CVA (cerebral vascular accident) 10/08/2011  . Erectile dysfunction 10/08/2011  . Preventative health care 08/09/2011  . CAD (coronary artery disease)   . Varicose veins 05/01/2011  . SPINAL STENOSIS, LUMBAR 10/01/2010  . ULCER, LEG 04/29/2010  . Osteoarthritis of spine 04/29/2010  . DYSLIPIDEMIA 01/15/2010  . Chronic venous hypertension with ulcer 01/15/2010  . Essential hypertension 12/14/2009  . MUSCLE CRAMPS 12/14/2009  . LEG PAIN, LEFT 11/29/2009  . DIABETES MELLITUS, TYPE II 11/01/2009  . GOUT 11/01/2009  . Iron deficiency anemia 11/01/2009  . MYOCARDIAL INFARCTION, HX OF 11/01/2009  . ATRIAL FIBRILLATION, CHRONIC 11/01/2009  . INTERNAL HEMORRHOIDS 11/01/2009  . PERIPHERAL EDEMA 11/01/2009  . H/O mechanical aortic valve replacement 11/01/2009  . Ascending aortic dissection 07/14/2008    Past Medical History  Diagnosis Date  . Hyperlipidemia   . Hypertension   . Arthritis   . Leg pain 06/28/2010  . Hiatal hernia   . Atrial fibrillation     chronic persistent  . Reflux 11/06/09  . Myocardial infarction age 26  . Varicose veins   . Obesity   . Peripheral vascular disease   . Gout   . CAD (coronary artery disease)     Old scar inferior wall myoview, 10/2009 EF 52%  . Chronic LBP 10/08/2011  . Impaired glucose tolerance 10/08/2011  . Erectile dysfunction 10/08/2011  . Testicular cancer   . Bell's palsy   . TIA (transient ischemic attack)     age 61  . CVA (cerebral infarction)     54yo  . OSA (  obstructive sleep apnea)     CPAP  . History of colon polyps   . Anemia   . Hypogonadism male 04/08/2012  . Aortic aneurysm   . Aortic aneurysm and dissection   . 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 Ascending aortic dissection 07/14/2008    Localized dissection of ascending aorta noted on CTA in 2009 and  stable on CTA in 2011  . Severe mitral regurgitation 10/11/2014  . Morbid obesity 04/02/2014  . Diabetes mellitus     diet controlled  . DIABETES MELLITUS, TYPE II 11/01/2009    Qualifier: Diagnosis of  By: [name] MD, [name]    . Dysrhythmia   . CHF (congestive heart failure)   . Shortness of breath dyspnea     with exertion  . Anxiety   . GERD (gastroesophageal reflux disease)     not needing medication at thhis time- 01/22/15  . Stroke     TIA and Stroke no residual effect- 1st was 34  . Chronic diastolic congestive heart failure   . 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 4/33/2951    33 mm St Jude bileaflet mechanical valve placed via right mini thoracotomy approach     Procedures: Reoperation for Minimally-Invasive Mitral Valve Replacement - 01/24/2015  St. Jude bileaflet mechanical valve (size 65mm)    HPI:  The patient is a 54 y.o. male with complex past medical history including Bentall aortic root replacement using a mechanical valve conduit with single-vessel coronary artery bypass grafting in the distant past who was referred to Dr. Roxy Manns for possible surgical treatment of severe symptomatic mitral regurgitation. 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 At the time, the patient lived in [place], where the diagnosis was made. The patient traveled to [place], [place], where he underwent urgent surgery by Dr. Blase Mess at [facility]. According to the patient, his aortic valve and entire ascending thoracic aorta was replaced using a 25 mm St. Jude mechanical valve conduit. Single-vessel coronary artery bypass grafting to the right coronary artery was performed at the time because of acute myocardial infarction. He does not recall being told whether or not he  had a bicuspid aortic valve or evidence for other valvular heart disease at the time. He recovered from the surgery uneventfully. He does not recall ever being told about any residual chronic aortic dissection or false aneurysm. Over the past 25 years, the patient has developed progressive symptoms of exertional shortness of breath, orthopnea, and severe bilateral lower extremity edema consistent with chronic diastolic congestive heart failure. He developed chronic persistent atrial fibrillation for which he has been managed on medical therapy for at least 15 years. He suffered a minor stroke and another brief transient ischemic attack in the distant past, and both were associated with times when Coumadin therapy was subtherapeutic. The patient and his wife moved to [place] in 2008, and he has been followed for the last several years by Dr. [name]. Recent follow-up transthoracic echocardiogram demonstrated the presence of mitral valve prolapse with possible ruptured chordae tendineae, and subsequent transesophageal echocardiogram confirmed similar findings with likely severe mitral regurgitation. The patient was referred for surgical consultation. Dr. Roxy Manns felt that he would be  a good candidate for minimally invasive mitral valve repair/replacement at this time. All risks, benefits and alternatives of surgery were explained in detail, and the patient agreed to proceed.     Hospital Course:  The patient was admitted to [facility] on 01/24/2015. The patient was taken to the operating room and underwent the above procedure.    Initially postoperatively, the patient had issues with hypoxemia despite multiple modes of ventilation trials and recruitment maneuvers.  Pulmonary critical care was consulted for assistance with management. It was felt that his condition was consistent with unilateral acute lung injury complicating pre-existing hypoxemia and chronic respiratory insufficiency. The patient was  continued on the ventilator and treated with antibiotics, diuretics, steroids and aggressive pulmonary toilet measures. He slowly began to show improvement in his pulmonary status. He remained on inotropic support and drips were weaned as tolerated.  He was started on enteral nutrition while on the ventilator. He also developed C difficile colitis and was started on Flagyl.   The patient made slow but steady progress with vent wean and was able to be extubated on 02/02/2015. He continues to use home CPAP with sleeping.  He completed a course of empiric antibiotics for presumed pneumonia.  He remains in rate controlled atrial fibrillation.  Blood pressures have been stable and he has been weaned off all drips.  A swallowing study was performed and the patient has been started on an oral diet, which he is tolerating without difficulty. He did have some mild confusion which was felt to be related to hypernatremia and ICU psychosis, but has otherwise remained neurologically intact. He has been aggressively diuresed and weight is below baseline. Diarrhea is resolving.  Blood sugars have been controlled on sliding scale insulin. Coumadin has been started and INR goal is 2.5 to 3.5. The patient has been working with physical and occupational therapy due to deconditioning, and further therapy in [place] has been recommended.  The patient is overall doing well and is medically stable for transfer to rehab when a bed is available.     Recent vital signs:  Filed Vitals:   02/08/15 1130  BP:   Pulse: 83  Temp:   Resp:     Recent laboratory studies:  CBC:  Recent Labs  02/07/15 0300  WBC 10.7*  HGB 10.3*  HCT 34.2*  PLT 200   BMET:   Recent Labs  02/07/15 0300 02/08/15 0523  NA 138 136  K 4.0 3.9  CL 97* 96*  CO2 34* 32  GLUCOSE 85 107*  BUN 15 14  CREATININE 0.78 0.82  CALCIUM 8.0* 8.1*    PT/INR:   Recent Labs  02/08/15 0523  LABPROT 30.5*  INR 2.98*     Discharge  Medications:     Medication List    STOP taking these medications        CARTIA XT 120 MG 24 hr capsule  Generic drug:  diltiazem     enoxaparin 100 MG/ML injection  Commonly known as:  LOVENOX      TAKE these medications        allopurinol 300 MG tablet  Commonly known as:  ZYLOPRIM  Take 300 mg by mouth daily.     aspirin 81 MG EC tablet  Take 1 tablet (81 mg total) by mouth daily.     atenolol 50 MG tablet  Commonly known as:  TENORMIN  Take 1 tablet (50 mg total) by mouth 2 (two) times daily.  b complex vitamins tablet  Take 1 tablet by mouth daily.     busPIRone 7.5 MG tablet  Commonly known as:  BUSPAR  Take 7.5 mg by mouth 2 (two) times daily.     CAL-MAG-ZINC PO  Take by mouth daily.     clindamycin 300 MG capsule  Commonly known as:  CLEOCIN  Take 2 capsules by mouth one hour prior to dental appointment.     collagenase ointment  Commonly known as:  SANTYL  Apply topically daily.     digoxin 0.25 MG tablet  Commonly known as:  LANOXIN  Take 1 tablet (0.25 mg total) by mouth daily.     furosemide 40 MG tablet  Commonly known as:  LASIX  Take 1 tablet (40 mg total) by mouth 2 (two) times daily.     Gerhardt's butt cream Crea  Apply 1 application topically as needed for irritation.     methocarbamol 500 MG tablet  Commonly known as:  ROBAXIN  Take 500 mg by mouth 3 (three) times daily.     metroNIDAZOLE 500 MG tablet  Commonly known as:  FLAGYL  Take 1 tablet (500 mg total) by mouth every 8 (eight) hours.     ONE TOUCH ULTRA TEST test strip  Generic drug:  glucose blood     oxyCODONE 5 MG immediate release tablet  Commonly known as:  Oxy IR/ROXICODONE  Take 5 mg by mouth every 4 (four) hours as needed for moderate pain.     potassium chloride 10 MEQ tablet  Commonly known as:  K-DUR  Take 10 mEq by mouth 2 (two) times daily.     risperiDONE 0.25 MG tablet  Commonly known as:  RISPERDAL  Take 1 tablet (0.25 mg total) by mouth 2 (two)  times daily.     temazepam 15 MG capsule  Commonly known as:  RESTORIL  Take 15 mg by mouth at bedtime as needed for sleep.     warfarin 5 MG tablet  Commonly known as:  COUMADIN  Take 1 to 1.5 tablets by mouth daily as directed by coumadin clinic         Discharge Instructions:  1. Please obtain vital signs at least one time daily 2. Please weigh the patient daily. If he or she continues to gain weight contact the office at [phone]. 3. Ambulate patient at least three times daily. 4. May shower daily and clean incisions with soap and water.   5. Continue heart healthy carb modified diet. 6. Cleanse right groin incision daily with soap and water and apply Santyl cream 7. Continue wound care for sacral wound as previously recommended by wound care team 8. Consider application of unna boots for both lower legs once diarrhea has resolved    The patient has been discharged on:  1.Beta Blocker: Yes [ x ]  No [ ]   If No, reason:    2.Ace Inhibitor/ARB: Yes [ ]   No [ x ]  If No, reason: Low systolic BPs   3.Statin: Yes [  ]  No [ x ]  If No, reason: No CAD   4.[place]: Yes [ x ]  No [ ]   If No, reason:  Discharge Instructions    Amb Referral to Cardiac Rehabilitation    Complete by:  As directed   Congestive Heart Failure: If diagnosis is Heart Failure, patient MUST meet each of the CMS criteria: 1. Left Ventricular Ejection Fraction </= 35% 2. NYHA class II-IV symptoms despite  being on optimal heart failure therapy for at least 6 weeks. 3. Stable = have not had a recent (<6 weeks) or planned (<6 months) major cardiovascular hospitalization or procedure  Program Details: - Physician supervised classes - 1-3 classes per week over a 12-18 week period, generally for a total of 36 sessions  Physician Certification: I certify that the above Cardiac Rehabilitation treatment is medically necessary and is medically approved by me for treatment of this patient. The  patient is willing and cooperative, able to ambulate and medically stable to participate in exercise rehabilitation. The participant's progress and Individualized Treatment Plan will be reviewed by the Medical Director, Cardiac Rehab staff and as indicated by the Referring/Ordering Physician.  Diagnosis:  Valve Replacement/Repair           Follow-up Information    Follow up with Rexene Alberts, MD On 02/26/2015.   Specialty:  Cardiothoracic Surgery   Why:  Appointment is at 1:00   Contact information:   [place] [address] [phone]       Follow up with [facility] On 02/26/2015.   Why:  Please get CXR at 12:30   Contact information:   [facility], [place] 02/08/2015, 1:46 PM    I have seen and examined the patient and agree with the assessment and plan as outlined.  Rexene Alberts 02/08/2015 1:53 PM

## 2015-02-07 NOTE — Clinical Social Work Note (Signed)
CSW spoke with patient's wife [name] to discuss going to a SNF for short term rehab in case inpatient rehab does not take him.  Patient's wife became upset stating that the only place she wants him to go is in the hospital.  CSW tried to explain to patient's wife that inpatient rehab may not take him, and she stated she will make sure he gets into inpatient rehab.  CSW explained to patient that if insurance does not approve, CSW will have to look at SNF options for him or she would have to pay privately.  CSW tried to discuss with patient's wife if she does not want him to go to a SNF home health would be another option but patient's wife expressed her concern that she wants what is best for him and stated the hospital is what is the best and only place for him to get rehab.  Patient's wife stated she will talk with the doctor to get patient accepted into inpatient rehab.  CSW tried to talk with patient about the benefits of SNF for rehab, and patient's wife would not accept he may not be able to get inpatient rehab.  CSW explained to patient's wife that inpatient rehab are the ones who make the decision of taking a patient into inpatient rehab, and she stated the doctor told her what is best for him is to go to inpatient rehab.  Patient's wife stated she will get him into inpatient rehab whatever it takes, she expressed concern that if anything happens to him while he is in the hospital doctors can get to him much sooner then if he was at an outside facility.  CSW tried to explain to patient's wife about SNF having a Market researcher who can treat patient as needed, but patient's wife would not listen to [place].  CSW noticed that rehab admissions spoke to patient yesterday who reported that patient's wife had agreed to [facility], but patient's wife would not accept anything other than inpatient rehab today.  CSW contacted [employer] who said they are not contracted with [facility], so patient  would have to go to a different facility.  CSW to continue following discharge plan for patient.  Jones Broom. [place], MSW, [place] 02/07/2015 1:01 PM

## 2015-02-08 ENCOUNTER — Inpatient Hospital Stay (HOSPITAL_COMMUNITY): Payer: Medicare HMO

## 2015-02-08 ENCOUNTER — Inpatient Hospital Stay (HOSPITAL_COMMUNITY)
Admission: RE | Admit: 2015-02-08 | Discharge: 2015-02-15 | DRG: 945 | Disposition: A | Discharge status: Home Health | Payer: Medicare HMO | Source: Intra-hospital | Attending: Physical Medicine & Rehabilitation | Admitting: Physical Medicine & Rehabilitation

## 2015-02-08 DIAGNOSIS — G9340 Encephalopathy, unspecified: Secondary | ICD-10-CM | POA: Diagnosis present

## 2015-02-08 DIAGNOSIS — F419 Anxiety disorder, unspecified: Secondary | ICD-10-CM | POA: Diagnosis present

## 2015-02-08 DIAGNOSIS — R350 Frequency of micturition: Secondary | ICD-10-CM | POA: Diagnosis present

## 2015-02-08 DIAGNOSIS — D62 Acute posthemorrhagic anemia: Secondary | ICD-10-CM | POA: Diagnosis not present

## 2015-02-08 DIAGNOSIS — Z954 Presence of other heart-valve replacement: Secondary | ICD-10-CM

## 2015-02-08 DIAGNOSIS — I5033 Acute on chronic diastolic (congestive) heart failure: Secondary | ICD-10-CM | POA: Diagnosis not present

## 2015-02-08 DIAGNOSIS — R3915 Urgency of urination: Secondary | ICD-10-CM | POA: Diagnosis present

## 2015-02-08 DIAGNOSIS — R5381 Other malaise: Principal | ICD-10-CM | POA: Diagnosis present

## 2015-02-08 DIAGNOSIS — I2510 Atherosclerotic heart disease of native coronary artery without angina pectoris: Secondary | ICD-10-CM | POA: Diagnosis present

## 2015-02-08 DIAGNOSIS — I252 Old myocardial infarction: Secondary | ICD-10-CM

## 2015-02-08 DIAGNOSIS — K219 Gastro-esophageal reflux disease without esophagitis: Secondary | ICD-10-CM | POA: Diagnosis present

## 2015-02-08 DIAGNOSIS — G4733 Obstructive sleep apnea (adult) (pediatric): Secondary | ICD-10-CM | POA: Diagnosis present

## 2015-02-08 DIAGNOSIS — F05 Delirium due to known physiological condition: Secondary | ICD-10-CM | POA: Diagnosis present

## 2015-02-08 DIAGNOSIS — I10 Essential (primary) hypertension: Secondary | ICD-10-CM | POA: Diagnosis present

## 2015-02-08 DIAGNOSIS — A047 Enterocolitis due to Clostridium difficile: Secondary | ICD-10-CM | POA: Diagnosis present

## 2015-02-08 DIAGNOSIS — I872 Venous insufficiency (chronic) (peripheral): Secondary | ICD-10-CM | POA: Diagnosis present

## 2015-02-08 DIAGNOSIS — E119 Type 2 diabetes mellitus without complications: Secondary | ICD-10-CM | POA: Diagnosis present

## 2015-02-08 DIAGNOSIS — Z952 Presence of prosthetic heart valve: Secondary | ICD-10-CM

## 2015-02-08 DIAGNOSIS — F17290 Nicotine dependence, other tobacco product, uncomplicated: Secondary | ICD-10-CM | POA: Diagnosis present

## 2015-02-08 DIAGNOSIS — R600 Localized edema: Secondary | ICD-10-CM | POA: Diagnosis not present

## 2015-02-08 DIAGNOSIS — M109 Gout, unspecified: Secondary | ICD-10-CM | POA: Diagnosis present

## 2015-02-08 DIAGNOSIS — I890 Lymphedema, not elsewhere classified: Secondary | ICD-10-CM | POA: Diagnosis not present

## 2015-02-08 DIAGNOSIS — R0602 Shortness of breath: Secondary | ICD-10-CM

## 2015-02-08 DIAGNOSIS — I5032 Chronic diastolic (congestive) heart failure: Secondary | ICD-10-CM

## 2015-02-08 DIAGNOSIS — Z8673 Personal history of transient ischemic attack (TIA), and cerebral infarction without residual deficits: Secondary | ICD-10-CM | POA: Diagnosis not present

## 2015-02-08 DIAGNOSIS — G9349 Other encephalopathy: Secondary | ICD-10-CM | POA: Diagnosis present

## 2015-02-08 DIAGNOSIS — I480 Paroxysmal atrial fibrillation: Secondary | ICD-10-CM | POA: Diagnosis not present

## 2015-02-08 DIAGNOSIS — Z951 Presence of aortocoronary bypass graft: Secondary | ICD-10-CM

## 2015-02-08 DIAGNOSIS — R609 Edema, unspecified: Secondary | ICD-10-CM | POA: Diagnosis not present

## 2015-02-08 DIAGNOSIS — G8929 Other chronic pain: Secondary | ICD-10-CM | POA: Diagnosis present

## 2015-02-08 DIAGNOSIS — E785 Hyperlipidemia, unspecified: Secondary | ICD-10-CM | POA: Diagnosis present

## 2015-02-08 DIAGNOSIS — F5104 Psychophysiologic insomnia: Secondary | ICD-10-CM | POA: Diagnosis present

## 2015-02-08 DIAGNOSIS — D72829 Elevated white blood cell count, unspecified: Secondary | ICD-10-CM | POA: Diagnosis present

## 2015-02-08 DIAGNOSIS — I509 Heart failure, unspecified: Secondary | ICD-10-CM | POA: Diagnosis not present

## 2015-02-08 DIAGNOSIS — A0472 Enterocolitis due to Clostridium difficile, not specified as recurrent: Secondary | ICD-10-CM | POA: Diagnosis present

## 2015-02-08 DIAGNOSIS — F4322 Adjustment disorder with anxiety: Secondary | ICD-10-CM

## 2015-02-08 LAB — BASIC METABOLIC PANEL
Anion gap: 8 (ref 5–15)
BUN: 14 mg/dL (ref 6–20)
CO2: 32 mmol/L (ref 22–32)
Calcium: 8.1 mg/dL — ABNORMAL LOW (ref 8.9–10.3)
Chloride: 96 mmol/L — ABNORMAL LOW (ref 101–111)
Creatinine, Ser: 0.82 mg/dL (ref 0.61–1.24)
GFR calc Af Amer: >60 mL/min (ref >60)
GFR calc non Af Amer: >60 mL/min (ref >60)
Glucose, Bld: 107 mg/dL — ABNORMAL HIGH (ref 65–99)
POTASSIUM: 3.9 mmol/L (ref 3.5–5.1)
Sodium: 136 mmol/L (ref 135–145)

## 2015-02-08 LAB — GLUCOSE, CAPILLARY
GLUCOSE-CAPILLARY: 79 mg/dL (ref 65–99)
GLUCOSE-CAPILLARY: 192 mg/dL — AB (ref 65–99)
GLUCOSE-CAPILLARY: 85 mg/dL (ref 65–99)
Glucose-Capillary: 112 mg/dL — ABNORMAL HIGH (ref 65–99)

## 2015-02-08 LAB — MAGNESIUM: MAGNESIUM: 1.8 mg/dL (ref 1.7–2.4)

## 2015-02-08 LAB — PROTIME-INR
INR: 2.98 — AB (ref 0.00–1.49)
PROTHROMBIN TIME: 30.5 s — AB (ref 11.6–15.2)

## 2015-02-08 LAB — PHOSPHORUS: Phosphorus: 2.9 mg/dL (ref 2.5–4.6)

## 2015-02-08 MED ORDER — DIPHENHYDRAMINE HCL 12.5 MG/5ML PO ELIX: 12.5000 mg | ORAL_SOLUTION | Freq: Four times a day (QID) | ORAL | Status: DC | PRN

## 2015-02-08 MED ORDER — INSULIN ASPART 100 UNIT/ML ~~LOC~~ SOLN
0.0000 [IU] | Freq: Three times a day (TID) | SUBCUTANEOUS | Status: DC
  Administered 2015-02-09: 3 [IU] via SUBCUTANEOUS

## 2015-02-08 MED ORDER — RISPERIDONE 0.25 MG PO TABS: 0.2500 mg | ORAL_TABLET | Freq: Two times a day (BID) | ORAL | Status: DC

## 2015-02-08 MED ORDER — GERHARDT'S BUTT CREAM: 1.0000 "application " | TOPICAL_CREAM | CUTANEOUS | Status: DC | PRN

## 2015-02-08 MED ORDER — METRONIDAZOLE 500 MG PO TABS
500.0000 mg | ORAL_TABLET | Freq: Three times a day (TID) | ORAL | Status: AC
Start: 2015-02-09 — End: 2015-02-13
  Administered 2015-02-09 – 2015-02-13 (×14): 500 mg via ORAL
  Filled 2015-02-08 (×14): qty 1

## 2015-02-08 MED ORDER — RISPERIDONE 0.25 MG PO TABS
0.2500 mg | ORAL_TABLET | Freq: Two times a day (BID) | ORAL | Status: DC
Start: 2015-02-08 — End: 2015-02-13
  Administered 2015-02-08 – 2015-02-13 (×10): 0.25 mg via ORAL
  Filled 2015-02-08 (×12): qty 1

## 2015-02-08 MED ORDER — BUSPIRONE HCL 15 MG PO TABS
7.5000 mg | ORAL_TABLET | Freq: Two times a day (BID) | ORAL | Status: DC
  Administered 2015-02-08 – 2015-02-13 (×10): 7.5 mg via ORAL
  Filled 2015-02-08 (×13): qty 1

## 2015-02-08 MED ORDER — WHITE PETROLATUM GEL: Status: DC | PRN

## 2015-02-08 MED ORDER — DIGOXIN 250 MCG PO TABS
0.2500 mg | ORAL_TABLET | Freq: Every day | ORAL | Status: DC
  Administered 2015-02-09 – 2015-02-15 (×7): 0.25 mg via ORAL
  Filled 2015-02-08 (×8): qty 1

## 2015-02-08 MED ORDER — GERHARDT'S BUTT CREAM
TOPICAL_CREAM | CUTANEOUS | Status: DC | PRN
  Filled 2015-02-08: qty 1

## 2015-02-08 MED ORDER — RISPERIDONE 0.25 MG PO TABS
0.2500 mg | ORAL_TABLET | Freq: Two times a day (BID) | ORAL | Status: DC
  Administered 2015-02-08: 0.25 mg via ORAL
  Filled 2015-02-08 (×2): qty 1

## 2015-02-08 MED ORDER — METHOCARBAMOL 500 MG PO TABS
500.0000 mg | ORAL_TABLET | Freq: Four times a day (QID) | ORAL | Status: DC | PRN
  Administered 2015-02-10 – 2015-02-14 (×8): 500 mg via ORAL
  Filled 2015-02-08 (×8): qty 1

## 2015-02-08 MED ORDER — ATENOLOL 50 MG PO TABS: 50.0000 mg | ORAL_TABLET | Freq: Two times a day (BID) | ORAL | Status: DC

## 2015-02-08 MED ORDER — ATENOLOL 50 MG PO TABS
50.0000 mg | ORAL_TABLET | Freq: Two times a day (BID) | ORAL | Status: DC
  Administered 2015-02-08 – 2015-02-15 (×14): 50 mg via ORAL
  Filled 2015-02-08 (×16): qty 1

## 2015-02-08 MED ORDER — ASPIRIN EC 81 MG PO TBEC
81.0000 mg | DELAYED_RELEASE_TABLET | Freq: Every day | ORAL | Status: DC
  Administered 2015-02-09 – 2015-02-15 (×7): 81 mg via ORAL
  Filled 2015-02-08 (×8): qty 1

## 2015-02-08 MED ORDER — TEMAZEPAM 7.5 MG PO CAPS: 7.5000 mg | ORAL_CAPSULE | Freq: Every day | ORAL | Status: DC

## 2015-02-08 MED ORDER — FLEET ENEMA 7-19 GM/118ML RE ENEM: 1.0000 | ENEMA | Freq: Once | RECTAL | Status: AC | PRN

## 2015-02-08 MED ORDER — COLLAGENASE 250 UNIT/GM EX OINT
TOPICAL_OINTMENT | Freq: Every day | CUTANEOUS | Status: DC
  Administered 2015-02-09 – 2015-02-14 (×6): via TOPICAL
  Filled 2015-02-08: qty 30

## 2015-02-08 MED ORDER — ASPIRIN 81 MG PO TBEC: 81.0000 mg | DELAYED_RELEASE_TABLET | Freq: Every day | ORAL | Status: DC

## 2015-02-08 MED ORDER — FUROSEMIDE 40 MG PO TABS: 40.0000 mg | ORAL_TABLET | Freq: Two times a day (BID) | ORAL | Status: DC

## 2015-02-08 MED ORDER — PRO-STAT SUGAR FREE PO LIQD
30.0000 mL | Freq: Two times a day (BID) | ORAL | Status: DC
  Administered 2015-02-08 – 2015-02-15 (×14): 30 mL via ORAL
  Filled 2015-02-08 (×16): qty 30

## 2015-02-08 MED ORDER — POTASSIUM CHLORIDE ER 10 MEQ PO TBCR
20.0000 meq | EXTENDED_RELEASE_TABLET | Freq: Two times a day (BID) | ORAL | Status: DC
  Administered 2015-02-09 – 2015-02-15 (×13): 20 meq via ORAL
  Filled 2015-02-08 (×16): qty 2

## 2015-02-08 MED ORDER — WARFARIN SODIUM 5 MG PO TABS: 5.0000 mg | ORAL_TABLET | Freq: Every day | ORAL | Status: DC

## 2015-02-08 MED ORDER — ALUM & MAG HYDROXIDE-SIMETH 200-200-20 MG/5ML PO SUSP: 30.0000 mL | ORAL | Status: DC | PRN

## 2015-02-08 MED ORDER — OXYCODONE HCL 5 MG PO TABS
5.0000 mg | ORAL_TABLET | ORAL | Status: DC | PRN
Start: 2015-02-08 — End: 2015-02-15
  Administered 2015-02-09 – 2015-02-15 (×15): 5 mg via ORAL
  Filled 2015-02-08 (×18): qty 1

## 2015-02-08 MED ORDER — COLLAGENASE 250 UNIT/GM EX OINT: TOPICAL_OINTMENT | Freq: Every day | CUTANEOUS | Status: DC

## 2015-02-08 MED ORDER — ALLOPURINOL 300 MG PO TABS
300.0000 mg | ORAL_TABLET | Freq: Every day | ORAL | Status: DC
  Administered 2015-02-09 – 2015-02-15 (×7): 300 mg via ORAL
  Filled 2015-02-08 (×8): qty 1

## 2015-02-08 MED ORDER — PROCHLORPERAZINE 25 MG RE SUPP
12.5000 mg | Freq: Four times a day (QID) | RECTAL | Status: DC | PRN
  Filled 2015-02-08: qty 1

## 2015-02-08 MED ORDER — SENNOSIDES-DOCUSATE SODIUM 8.6-50 MG PO TABS: 1.0000 | ORAL_TABLET | Freq: Every evening | ORAL | Status: DC | PRN

## 2015-02-08 MED ORDER — CHOLESTYRAMINE LIGHT 4 G PO PACK
4.0000 g | PACK | Freq: Every day | ORAL | Status: DC
  Administered 2015-02-09 – 2015-02-15 (×7): 4 g via ORAL
  Filled 2015-02-08 (×8): qty 1

## 2015-02-08 MED ORDER — PROCHLORPERAZINE EDISYLATE 5 MG/ML IJ SOLN
5.0000 mg | Freq: Four times a day (QID) | INTRAMUSCULAR | Status: DC | PRN
  Filled 2015-02-08: qty 2

## 2015-02-08 MED ORDER — MAGNESIUM SULFATE 2 GM/50ML IV SOLN
2.0000 g | Freq: Once | INTRAVENOUS | Status: AC
  Administered 2015-02-08: 2 g via INTRAVENOUS
  Filled 2015-02-08: qty 50

## 2015-02-08 MED ORDER — WARFARIN SODIUM 5 MG PO TABS
5.0000 mg | ORAL_TABLET | Freq: Every day | ORAL | Status: DC
  Administered 2015-02-08: 5 mg via ORAL
  Filled 2015-02-08: qty 1

## 2015-02-08 MED ORDER — METRONIDAZOLE 500 MG PO TABS: 500.0000 mg | ORAL_TABLET | Freq: Three times a day (TID) | ORAL | Status: AC

## 2015-02-08 MED ORDER — FUROSEMIDE 40 MG PO TABS
40.0000 mg | ORAL_TABLET | Freq: Two times a day (BID) | ORAL | Status: DC
  Administered 2015-02-09 – 2015-02-11 (×5): 40 mg via ORAL
  Filled 2015-02-08 (×7): qty 1

## 2015-02-08 MED ORDER — TEMAZEPAM 7.5 MG PO CAPS
7.5000 mg | ORAL_CAPSULE | Freq: Every day | ORAL | Status: DC
  Administered 2015-02-08 – 2015-02-12 (×5): 7.5 mg via ORAL
  Filled 2015-02-08 (×5): qty 1

## 2015-02-08 MED ORDER — BISACODYL 10 MG RE SUPP: 10.0000 mg | Freq: Every day | RECTAL | Status: DC | PRN

## 2015-02-08 MED ORDER — PROCHLORPERAZINE MALEATE 5 MG PO TABS
5.0000 mg | ORAL_TABLET | Freq: Four times a day (QID) | ORAL | Status: DC | PRN
  Filled 2015-02-08: qty 2

## 2015-02-08 MED ORDER — LEVALBUTEROL HCL 0.63 MG/3ML IN NEBU
0.6300 mg | INHALATION_SOLUTION | RESPIRATORY_TRACT | Status: DC | PRN
  Filled 2015-02-08: qty 3

## 2015-02-08 NOTE — Progress Notes (Signed)
Physical Medicine and Rehabilitation Consult  Reason for Consult: Deconditioning with encephalopathy past MVR Referring Physician: Dr. Roxy Manns   HPI: Lance Cook is a 54 y.o. male with history of CAF, PVD, CAD s/p CAD with AVR '99, severe MR who was admitted for minimally invasive MVR on 01/24/15 by Dr. Roxy Manns. Post op with hypoxemia due to edema v/s ARDS and required prolonged vent support. He was treated with aggressive diuresis and possible HCAP. He has had issues with mucous plugging as well as c diff colitis and was started on metronidazole on 05/31. He tolerated extubation with prn BIPAP use on 06/03. He has had worsening of ICU delirium with confusion and hallucinations 6/05 and Risperdal was added to help with symptoms. PT evaluation done this weekend and patient noted to be severely deconditioned. CIR recommended by MD and PT for follow up therapy.   Spoke with patient's RN, he had a better night last night, rectal tube is out, still has right-sided chest tube. Confusion is improving but still has language of confusion Review of Systems  Constitutional: Positive for malaise/fatigue.  HENT: Negative for hearing loss.  Eyes: Negative for blurred vision and double vision.  Respiratory: Negative for cough, shortness of breath and wheezing.  Cardiovascular: Negative for chest pain and palpitations.  Gastrointestinal: Positive for heartburn and diarrhea. Negative for nausea and vomiting.  Genitourinary:   Foley in place  Musculoskeletal:   Buttock pain  Neurological: Positive for dizziness. Negative for headaches.  Psychiatric/Behavioral: The patient does not have insomnia (slept well last night. ).      Past Medical History  Diagnosis Date  . Hyperlipidemia   . Hypertension   . Arthritis   . Leg pain 06/28/2010  . Hiatal hernia   . Atrial fibrillation     chronic persistent  . Reflux 11/06/09  . Myocardial infarction age 79  . Varicose  veins   . Obesity   . Peripheral vascular disease   . Gout   . CAD (coronary artery disease)     Old scar inferior wall myoview, 10/2009 EF 52%  . Chronic LBP 10/08/2011  . Impaired glucose tolerance 10/08/2011  . Erectile dysfunction 10/08/2011  . Testicular cancer   . Bell's palsy   . TIA (transient ischemic attack)     age 36  . CVA (cerebral infarction)     54yo  . OSA (obstructive sleep apnea)     CPAP  . History of colon polyps   . Anemia   . Hypogonadism male 04/08/2012  . Aortic aneurysm   . Aortic aneurysm and dissection   . 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 Ascending aortic dissection 07/14/2008    Localized dissection of ascending aorta noted on CTA in 2009 and stable on CTA in 2011  . Severe mitral regurgitation 10/11/2014  . Morbid obesity 04/02/2014  . Diabetes mellitus     diet controlled  . DIABETES MELLITUS, TYPE II 11/01/2009    Qualifier: Diagnosis of By: [name] MD, [name]   . Dysrhythmia   . CHF (congestive heart failure)   . Shortness of breath dyspnea     with exertion  . Anxiety   . GERD (gastroesophageal reflux disease)     not needing medication at thhis time- 01/22/15  . Stroke     TIA and Stroke no residual effect- 1st was 36  . Chronic diastolic congestive  heart failure   . S/P 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 7/82/9562    33 mm St Jude bileaflet mechanical valve placed via right mini thoracotomy approach    Past Surgical History  Procedure Laterality Date  . Laser ablation  03/06/2010    left leg  . Cardiac valve replacement      [id number]  . Cholecystectomy  2011  . Tonsillectomy  1967  . Otoplasty      bilateral, age 50  . Orchiectomy   age 32    testicular cancer  . Tee without cardioversion N/A 10/11/2014    Procedure: TRANSESOPHAGEAL ECHOCARDIOGRAM (TEE); Surgeon: Pixie Casino, MD; Location: [facility] ENDOSCOPY; Service: Cardiovascular; Laterality: N/ADeneen Harts procedure  1988    25 mm St Jude mechanical valve conduit - Dr Blase Mess at [facility] in [place], [place]  . Coronary artery bypass graft    . Aortic truck    . Tee without cardioversion N/A 01/24/2015    Procedure: TRANSESOPHAGEAL ECHOCARDIOGRAM (TEE); Surgeon: Rexene Alberts, MD; Location: [facility]; Service: Open Heart Surgery; Laterality: N/A;  . Mitral valve replacement Right 01/24/2015    Procedure: Re-Operation, MINIMALLY INVASIVE MITRAL VALVE (MV) REPLACEMENT.; Surgeon: Rexene Alberts, MD; Location: [place]; Service: Open Heart Surgery; Laterality: Right;    Family History  Problem Relation Age of Onset  . Heart disease Mother   . Throat cancer Mother   . Diabetes Father   . Stroke Other   . Hypertension Other   . Cancer Maternal Aunt     lung, brain  . Cancer Maternal Uncle     brain aneurysm    Social History: Married. Independent but disabled due to medical issues. Wife has health problems and does not drive. Per reports that he has been smoking Cigars. He has never used smokeless tobacco. He reports that he drinks alcohol. He reports that he does not use illicit drugs.     Allergies  Allergen Reactions  . Penicillins     REACTION: Hives  . Adhesive [Tape] Other (See Comments)    Shin irritation    Medications Prior to Admission  Medication Sig Dispense Refill  . allopurinol (ZYLOPRIM) 300 MG tablet Take 300 mg by mouth daily.    Marland Kitchen atenolol (TENORMIN) 100 MG tablet Take 100 mg by mouth 2 (two) times daily.    Marland Kitchen b complex vitamins tablet Take 1 tablet by mouth daily.     . busPIRone (BUSPAR) 7.5  MG tablet Take 7.5 mg by mouth 2 (two) times daily.     . Calcium-Magnesium-Zinc (CAL-MAG-ZINC PO) Take by mouth daily.     Marland Kitchen CARTIA XT 120 MG 24 hr capsule Take 120 mg by mouth daily.     . clindamycin (CLEOCIN) 300 MG capsule Take 2 capsules by mouth one hour prior to dental appointment. 2 capsule 1  . digoxin (LANOXIN) 0.25 MG tablet Take 1 tablet (0.25 mg total) by mouth daily. 90 tablet 3  . enoxaparin (LOVENOX) 100 MG/ML injection Inject 1 mL (100 mg total) into the skin every 12 (twelve) hours. 20 Syringe 0  . furosemide (LASIX) 40 MG tablet Take 20-40 mg by mouth daily as needed for fluid.    . methocarbamol (ROBAXIN) 500 MG tablet Take 500 mg by mouth 3 (three) times daily.     . ONE TOUCH ULTRA TEST test strip     . oxyCODONE (OXY IR/ROXICODONE) 5 MG immediate release tablet Take 5 mg by mouth every 4 (  four) hours as needed for moderate pain.     . potassium chloride (K-DUR) 10 MEQ tablet Take 10 mEq by mouth 2 (two) times daily.     . temazepam (RESTORIL) 15 MG capsule Take 15 mg by mouth at bedtime as needed for sleep.    Marland Kitchen warfarin (COUMADIN) 5 MG tablet Take 1 to 1.5 tablets by mouth daily as directed by coumadin clinic (Patient taking differently: Take 5-7.5 mg by mouth See admin instructions. 7.5mg  on Wednesdays, 5mg  all other days) 120 tablet 1    Home: Home Living Family/patient expects to be discharged to:: Private residence Living Arrangements: Spouse/significant other Additional Comments: pt too confused to complete; no family present  Functional History: Prior Function Level of Independence: Needs assistance Gait / Transfers Assistance Needed: per pt walked household distances only; used motorized cart at the store Comments: pt confused and ?accuracy Functional Status:  Mobility: Bed Mobility Overal bed mobility: Needs Assistance, +2 for physical assistance, + 2 for safety/equipment Bed Mobility:  Supine to Sit Supine to sit: Mod assist, +2 for physical assistance, +2 for safety/equipment, HOB elevated General bed mobility comments: HOB to maintain respiratory status; pt required assist with legs over EOB and bringing hips to sit at edge (pt was helping) Transfers Overall transfer level: Needs assistance Equipment used: 2 person hand held assist Transfers: Sit to/from Stand, Stand Pivot Transfers Sit to Stand: Min assist, +2 physical assistance, +2 safety/equipment Stand pivot transfers: Mod assist, +2 physical assistance, +2 safety/equipment General transfer comment: as began to step-pivot to chair, pt with increasing posterior lean. He attempted to compensate with hip flexion and bringing torso forward, making stepping more difficult      ADL:    Cognition: Cognition Overall Cognitive Status: No family/caregiver present to determine baseline cognitive functioning Orientation Level: Oriented to person Cognition Arousal/Alertness: Awake/alert Behavior During Therapy: WFL for tasks assessed/performed Overall Cognitive Status: No family/caregiver present to determine baseline cognitive functioning  Blood pressure 102/64, pulse 90, temperature 97.5 F (36.4 C), temperature source Axillary, resp. rate 37, height 5\' 1"  (1.549 m), weight 101.1 kg (222 lb 14.2 oz), SpO2 98 %. Physical Exam  Nursing note and vitals reviewed. Constitutional: He is oriented to person, place, and time. He appears well-developed and well-nourished.  Morbidly obese male up in chair. NAD.  HENT:  Head: Normocephalic and atraumatic.  Eyes: Conjunctivae are normal. Pupils are equal, round, and reactive to light.  Neck: Normal range of motion. Neck supple.  Cardiovascular: Normal rate. An irregular rhythm present.  Murmur heard. Mechanical heart valve sounds  Respiratory: Effort normal. No respiratory distress. He has decreased breath sounds. He has no wheezes.  Right chest tube in place  GI: Soft.  Bowel sounds are normal. He exhibits no distension.  Genitourinary:  Foley in place with blood tinged urine.  Neurological: He is alert and oriented to person, place, and time.  Speech clear and able to answer orientation with minimal cues. . Has poor insight with inappropriate comments about his guns being locked up. He was able to follow basic commands without difficulty. Moves all four.  Skin: Skin is warm and dry.  Bilateral shins with stasis dermatitis.  Motor strength is 4/5 bilateral deltoid, biceps, triceps, grip 3 minus bilateral hip flexor 4 minus bilateral knee extensor 4 bilateral ankle dorsiflexor plantar flexor Sensation intact to light touch in bilateral upper and lower limbs   Lab Results Last 24 Hours    Results for orders placed or performed during the hospital encounter  of 01/24/15 (from the past 24 hour(s))  Brain natriuretic peptide Status: Abnormal   Collection Time: 02/04/15 10:33 AM  Result Value Ref Range   B Natriuretic Peptide 557.7 (H) 0.0 - 100.0 pg/mL  Basic metabolic panel Status: Abnormal   Collection Time: 02/04/15 10:33 AM  Result Value Ref Range   Sodium 139 135 - 145 mmol/L   Potassium 3.2 (L) 3.5 - 5.1 mmol/L   Chloride 93 (L) 101 - 111 mmol/L   CO2 38 (H) 22 - 32 mmol/L   Glucose, Bld 144 (H) 65 - 99 mg/dL   BUN 28 (H) 6 - 20 mg/dL   Creatinine, Ser 0.91 0.61 - 1.24 mg/dL   Calcium 8.0 (L) 8.9 - 10.3 mg/dL   GFR calc non Af Amer >60 >60 mL/min   GFR calc Af Amer >60 >60 mL/min   Anion gap 8 5 - 15  Glucose, capillary Status: Abnormal   Collection Time: 02/04/15 12:27 PM  Result Value Ref Range   Glucose-Capillary 115 (H) 65 - 99 mg/dL   Comment 1 Capillary Specimen    Comment 2 Notify RN   Glucose, capillary Status: Abnormal   Collection Time: 02/04/15 4:24 PM  Result Value Ref Range   Glucose-Capillary 118 (H) 65 - 99 mg/dL    Glucose, capillary Status: None   Collection Time: 02/04/15 9:54 PM  Result Value Ref Range   Glucose-Capillary 74 65 - 99 mg/dL   Comment 1 Notify RN    Comment 2 Document in Chart   Glucose, capillary Status: None   Collection Time: 02/05/15 3:59 AM  Result Value Ref Range   Glucose-Capillary 69 65 - 99 mg/dL   Comment 1 Capillary Specimen   Protime-INR Status: Abnormal   Collection Time: 02/05/15 5:21 AM  Result Value Ref Range   Prothrombin Time 21.9 (H) 11.6 - 15.2 seconds   INR 1.93 (H) 0.00 - 1.49  CBC Status: Abnormal   Collection Time: 02/05/15 5:21 AM  Result Value Ref Range   WBC 12.2 (H) 4.0 - 10.5 K/uL   RBC 4.15 (L) 4.22 - 5.81 MIL/uL   Hemoglobin 10.7 (L) 13.0 - 17.0 g/dL   HCT 36.6 (L) 39.0 - 52.0 %   MCV 88.2 78.0 - 100.0 fL   MCH 25.8 (L) 26.0 - 34.0 pg   MCHC 29.2 (L) 30.0 - 36.0 g/dL   RDW 17.6 (H) 11.5 - 15.5 %   Platelets 211 150 - 400 K/uL  Comprehensive metabolic panel Status: Abnormal   Collection Time: 02/05/15 5:21 AM  Result Value Ref Range   Sodium 143 135 - 145 mmol/L   Potassium 3.3 (L) 3.5 - 5.1 mmol/L   Chloride 96 (L) 101 - 111 mmol/L   CO2 39 (H) 22 - 32 mmol/L   Glucose, Bld 75 65 - 99 mg/dL   BUN 24 (H) 6 - 20 mg/dL   Creatinine, Ser 0.82 0.61 - 1.24 mg/dL   Calcium 7.8 (L) 8.9 - 10.3 mg/dL   Total Protein 4.7 (L) 6.5 - 8.1 g/dL   Albumin 2.0 (L) 3.5 - 5.0 g/dL   AST 20 15 - 41 U/L   ALT 14 (L) 17 - 63 U/L   Alkaline Phosphatase 37 (L) 38 - 126 U/L   Total Bilirubin 0.8 0.3 - 1.2 mg/dL   GFR calc non Af Amer >60 >60 mL/min   GFR calc Af Amer >60 >60 mL/min   Anion gap 8 5 - 15  Prealbumin Status: Abnormal   Collection  Time: 02/05/15 5:21 AM  Result Value Ref Range   Prealbumin 14.1 (L) 18 - 38 mg/dL      Imaging Results (Last  48 hours)    Dg Chest Port 1 View  02/05/2015 CLINICAL DATA: 54 year old male with congestive heart failure. Mitral valve replacement recently. Initial encounter. EXAM: PORTABLE CHEST - 1 VIEW COMPARISON: 02/03/2015 and earlier. FINDINGS: Portable AP upright view at 0642 hours. Two right chest tubes remain in place. Stable right PICC line. Stable lung volumes and no pneumothorax or pleural effusion identified. Stable cardiomegaly and mediastinal contours. Prosthetic cardiac valve and sternotomy wires re- identified. Stable vascular congestion without overt edema. Decreased patchy opacity in the right lung since 02/02/2015. No areas of worsening ventilation. IMPRESSION: 1. Stable lines and tubes. 2. No pneumothorax. Stable mild vascular congestion. Electronically Signed By: Genevie Ann M.D. On: 02/05/2015 07:43     Assessment/Plan: Diagnosis: Deconditioning after mitral valve replacement 01/24/2015 in a patient with morbid obesity and postoperative delirium 1. Does the need for close, 24 hr/day medical supervision in concert with the patient's rehab needs make it unreasonable for this patient to be served in a less intensive setting? Yes 2. Co-Morbidities requiring supervision/potential complications: Diabetes, congestive heart failure, status post aortic valve replacement 1999 3. Due to bladder management, bowel management, safety, skin/wound care, disease management, medication administration, pain management and patient education, does the patient require 24 hr/day rehab nursing? Yes 4. Does the patient require coordinated care of a physician, rehab nurse, PT (1-2 hrs/day, 5 days/week), OT (1-2 hrs/day, 5 days/week) and SLP (0.5-1 hrs/day, 5 days/week) to address physical and functional deficits in the context of the above medical diagnosis(es)? Yes Addressing deficits in the following areas: balance, endurance, locomotion, strength, transferring, bowel/bladder control, bathing, dressing,  feeding, grooming, toileting, cognition and psychosocial support 5. Can the patient actively participate in an intensive therapy program of at least 3 hrs of therapy per day at least 5 days per week? Potentially 6. The potential for patient to make measurable gains while on inpatient rehab is good 7. Anticipated functional outcomes upon discharge from inpatient rehab are supervision with PT, supervision with OT, supervision with SLP. 8. Estimated rehab length of stay to reach the above functional goals is: 10-14 days 9. Does the patient have adequate social supports and living environment to accommodate these discharge functional goals? Yes 10. Anticipated D/C setting: Home 11. Anticipated post D/C treatments: [place] therapy 12. Overall Rehab/Functional Prognosis: good  RECOMMENDATIONS: This patient's condition is appropriate for continued rehabilitative care in the following setting: CIR Patient has agreed to participate in recommended program. Potentially Note that insurance prior authorization may be required for reimbursement for recommended care.  Comment: Patient needs chest tube out prior to rehabilitation    02/05/2015       Revision History     Date/Time User Provider Type Action   02/05/2015 5:21 PM Charlett Blake, MD Physician Sign   02/05/2015 9:18 AM Bary Leriche, PA-C Physician Assistant Pend   View Details Report       Routing History     Date/Time From To Method   02/05/2015 5:21 PM Charlett Blake, MD Charlett Blake, MD In Basket   02/05/2015 5:21 PM Charlett Blake, MD [name], PA-C Fax

## 2015-02-08 NOTE — Care Management Note (Signed)
Case Management Note  Patient Details  Name: Lance Cook MRN: [id number] Date of Birth: 1960-10-02  Subjective/Objective:        02-07-15 3:50pm [name], RNBSN - [phone] Dr. Roxy Manns updated on bed availability and insurance and to assist wife with understanding that due to insurance and beds may not get inhouse rehab but may be able to go to SNF - depending on insurance -.  Per Dr. Roxy Manns may be ready the first of next week for rehab ST at [facility].   In house rehab - no bed - ?? If insurance will approve.  SW working on SNF ST rehab bed.               Action/Plan:   Expected Discharge Date:       02/08/15           Expected Discharge Plan:  IP Rehab Facility  In-House Referral:  Clinical Social Work  Discharge planning Services  CM Consult  Post Acute Care Choice:    Choice offered to:     DME Arranged:    DME Agency:     HH Arranged:    [place] Agency:     Status of Service:  Completed, signed off  Medicare Important Message Given:  Yes Date Medicare IM Given:  02/06/15 Medicare IM give by:  [name], RNBSN -  Date Additional Medicare IM Given:    Additional Medicare Important Message give by:     If discussed at [place] of Stay Meetings, dates discussed:    Additional Comments:  02/08/15- per Pamala Hurry with CIR- pt did receive insurance auth. For CIR-they have bed for pt today and can admit pt today- spoke with [place] with Dr. Suann Larry inform of this and plan is to d/c pt to CIR later today.   Dawayne Patricia, RN 02/08/2015, 2:26 PM

## 2015-02-08 NOTE — Progress Notes (Signed)
CARDIAC REHAB PHASE I   PRE:  Rate/Rhythm: 73 afib  BP:  Supine:   Sitting: 102/66  Standing:    SaO2: 97%RA  MODE:  Ambulation: 150 ft   POST:  Rate/Rhythm: 94 SR  BP:  Supine:   Sitting: 127/91  Standing:    SaO2: 95%RA 1005-1040 Pt walked 150 ft on RA with rolling walker with steady gait. Stopped many times to rest due mainly to back pain and a little SOB. Pt stated he walks very little normally due to back issues. Sats good on RA. Did not give ex ed as pt is limited due to back issues. Did encourage use of IS, low sodium diet and discussed CRP 2. Pt stated he attended here once before and interested in trying to do again after recovery. Will refer to [place].  One asst followed with chair but pt did not need to sit. Only took standing rest breaks.   Graylon Good, RN BSN  02/08/2015 10:36 AM

## 2015-02-08 NOTE — Progress Notes (Signed)
ANTICOAGULATION CONSULT NOTE - Initial Consult  Pharmacy Consult:  Coumadin Indication: atrial fibrillation  Allergies  Allergen Reactions  . Penicillins     REACTION: Hives  . Adhesive [Tape] Other (See Comments)    Shin irritation    Patient Measurements: Height = 61 inches Weight 104.8 kg  Vital Signs: Temp: 98.2 F (36.8 C) (06/09 1436) Temp Source: Oral (06/09 1436) BP: 118/76 mmHg (06/09 1436) Pulse Rate: 71 (06/09 1436)  Labs:  Recent Labs  02/06/15 0410 02/07/15 0300 02/08/15 0523  HGB  --  10.3*  --   HCT  --  34.2*  --   PLT  --  200  --   LABPROT 24.5* 26.4* 30.5*  INR 2.23* 2.47* 2.98*  CREATININE 0.78 0.78 0.82    Estimated Creatinine Clearance: 108 mL/min (by C-G formula based on Cr of 0.82).   Medical History: Past Medical History  Diagnosis Date  . Hyperlipidemia   . Hypertension   . Arthritis   . Leg pain 06/28/2010  . Hiatal hernia   . Atrial fibrillation     chronic persistent  . Reflux 11/06/09  . Myocardial infarction age 61  . Varicose veins   . Obesity   . Peripheral vascular disease   . Gout   . CAD (coronary artery disease)     Old scar inferior wall myoview, 10/2009 EF 52%  . Chronic LBP 10/08/2011  . Impaired glucose tolerance 10/08/2011  . Erectile dysfunction 10/08/2011  . Testicular cancer   . Bell's palsy   . TIA (transient ischemic attack)     age 54  . CVA (cerebral infarction)     54yo  . OSA (obstructive sleep apnea)     CPAP  . History of colon polyps   . Anemia   . Hypogonadism male 04/08/2012  . Aortic aneurysm   . Aortic aneurysm and dissection   . 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 Ascending aortic dissection 07/14/2008    Localized dissection of ascending aorta noted on CTA in 2009 and stable on CTA in 2011  . Severe mitral regurgitation 10/11/2014  . Morbid obesity 04/02/2014  . Diabetes mellitus     diet controlled  . DIABETES MELLITUS, TYPE II 11/01/2009    Qualifier: Diagnosis of  By: [name] MD, [name]    . Dysrhythmia   . CHF (congestive heart failure)   . Shortness of breath dyspnea     with exertion  . Anxiety   . GERD (gastroesophageal reflux disease)     not needing medication at thhis time- 01/22/15  . Stroke     TIA and Stroke no residual effect- 1st was 54  . Chronic diastolic congestive heart failure   . 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 0/62/6948    33 mm St Jude bileaflet mechanical valve placed via right mini thoracotomy approach      Assessment: 54 YOM with history of Afib and recent history of MVR on 01/24/15 to continue on Coumadin.  INR therapeutic; no bleeding reported.  Noted patient is on Flagyl for C.diff.   Goal of Therapy:  INR 2.5 - 3.5    Plan:  - D/C current Coumadin order (already received 5mg  today) - Daily PT / INR - Watch INR closely while on Flagyl    Rande Dario D. Mina Marble, PharmD, [place] Pager:  [phone]  02/08/2015, 8:10 PM

## 2015-02-08 NOTE — Discharge Instructions (Signed)
Mitral Valve Replacement, Care After °Refer to this sheet in the next few weeks. These instructions provide you with information on caring for yourself after your procedure. Your health care provider may also give you specific instructions. Your treatment has been planned according to current medical practices, but problems sometimes occur. Call your health care provider if you have any problems or questions after your procedure.  °HOME CARE INSTRUCTIONS  °· Take medicines only as directed by your health care provider. °· Take your temperature every morning for the first 7 days after surgery. Write these down. °· Weigh yourself every morning for at least 7 days after surgery. Write your weight down. °· Wear elastic stockings during the day for at least 2 weeks after surgery or as directed by your health care provider. Use them longer if your ankles are swollen. The stockings help blood flow and help reduce swelling in the legs. °· Take frequent naps or rest often throughout the day. °· Avoid lifting more than 10 lb (4.5 kg) or pushing or pulling things with your arms for 6-8 weeks or as directed by your health care provider. °· Avoid driving or airplane travel for 4-6 weeks after surgery or as directed. If you are riding in a car for an extended period, stop every 1-2 hours to stretch your legs. °· Avoid crossing your legs. °· Avoid climbing stairs and using the handrail to pull yourself up for the first 2-3 weeks after surgery. °· Do not take baths for 2-4 weeks after surgery. Take showers once your health care provider approves. Pat incisions dry. Do not rub incisions with a washcloth or towel. °· Return to work as directed by your health care provider. °· Drink enough fluids to keep your urine clear or pale yellow. °· Do not strain to have a bowel movement. Eat high-fiber foods if you become constipated. You may also take a medicine to help you have a bowel movement (laxative) as directed by your health care  provider. °· Resume sexual activity as directed by your health care provider. °SEEK MEDICAL CARE IF:  °· You develop a skin rash.   °· Your weight is increasing each day over 2-3 days. °· Your weight increases by 2 or more pounds (1 kg) in a single day. °· You have a fever. °SEEK IMMEDIATE MEDICAL CARE IF:  °· You develop chest pain that is not coming from your incision. °· You develop shortness of breath or difficulty breathing. °· You have drainage, redness, swelling, or pain at your incision site. °· You have pus coming from your incision. °· You develop light-headedness. °MAKE SURE YOU: °· Understand these directions. °· Will watch your condition. °· Will get help right away if you are not doing well or get worse. °Document Released: 03/07/2005 Document Revised: 01/02/2014 Document Reviewed: 01/18/2013 °ExitCare® Patient Information ©2015 [employer]. This information is not intended to replace advice given to you by your health care provider. Make sure you discuss any questions you have with your health care provider. ° °

## 2015-02-08 NOTE — PMR Pre-admission (Signed)
PMR Admission Coordinator Pre-Admission Assessment  Patient: Lance Cook is an 54 y.o., male MRN: [id number] DOB: Mar 26, 1961 Height: 5\' 1"  (154.9 cm) Weight: 104.826 kg (231 lb 1.6 oz)              Insurance Information HMO: yes    PPO:      PCP:      IPA:      80/20:      OTHER: medicare advantage plan PRIMARY: [employer]      Policy#: [id number]      Subscriber: pt CM Name: Fredderick Phenix      Phone#: [phone] ext [phone]     Fax#: [phone] Pre-Cert#: [id number]      Employer: disabled approved for 14 days until 6/23 Benefits:  Phone #: [phone]     Name: 6/7 Eff. Date: 09/01/12     Deduct: none      Out of Pocket Max: $5500      Life Max: none CIR: $275 per day days 1-7 then covers 100%      SNF: no cpay days 1-20; $160 per day days 21-100 Outpatient: $40 copay per visit     Co-Pay: no visit limit Home Health: 100%      Co-Pay: no visit limit DME: 80%     Co-Pay: 20% Providers: in network  SECONDARY: none       Medicaid Application Date:       Case Manager:  Disability Application Date:       Case Worker:  Wife complains of not having Medicaid to assist. I requested her on 6/7 to follow up with eligibility for Medicaid. She states she called, and denied.  Emergency Contact Information Contact Information    Name Relation Home Work Mobile   Pratt,[name] Spouse [phone]  [phone]     Current Medical History  Patient Admitting Diagnosis: Deconditioning after mitral valve replacement 01/24/2015 in a patient with morbid obesity and postoperative delirium  History of Present Illness: Lance Cook is a 54 y.o. male with history of CAF, PVD, CAD s/p CAD with AVR '99, severe MR who was admitted for minimally invasive MVR on 01/24/15 by Dr. Roxy Manns. Post op with hypoxemia due to edema v/s ARDS and required prolonged vent support. He was treated with aggressive diuresis and possible HCAP. He has had issues with mucous plugging as well as c diff colitis and was started on metronidazole  on 05/31. He tolerated extubation with prn BIPAP use on 06/03. He has had worsening of ICU delirium with confusion and hallucinations 6/05 and Risperdal was added to help with symptoms.   Wife reports chronic use of temazepan for insomnia, buspar, robaxin and oxycodone. CCM feels likely contributed to delirium which is resolving.   Past Medical History  Past Medical History  Diagnosis Date  . Hyperlipidemia   . Hypertension   . Arthritis   . Leg pain 06/28/2010  . Hiatal hernia   . Atrial fibrillation     chronic persistent  . Reflux 11/06/09  . Myocardial infarction age 37  . Varicose veins   . Obesity   . Peripheral vascular disease   . Gout   . CAD (coronary artery disease)     Old scar inferior wall myoview, 10/2009 EF 52%  . Chronic LBP 10/08/2011  . Impaired glucose tolerance 10/08/2011  . Erectile dysfunction 10/08/2011  . Testicular cancer   . Bell's palsy   . TIA (transient ischemic attack)     age 41  . CVA (cerebral infarction)  54yo  . OSA (obstructive sleep apnea)     CPAP  . History of colon polyps   . Anemia   . Hypogonadism male 04/08/2012  . Aortic aneurysm   . Aortic aneurysm and dissection   . 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 Ascending aortic dissection 07/14/2008    Localized dissection of ascending aorta noted on CTA in 2009 and stable on CTA in 2011  . Severe mitral regurgitation 10/11/2014  . Morbid obesity 04/02/2014  . Diabetes mellitus     diet controlled  . DIABETES MELLITUS, TYPE II 11/01/2009    Qualifier: Diagnosis of  By: [name] MD, [name]    . Dysrhythmia   . CHF (congestive heart failure)   . Shortness of breath dyspnea     with exertion  . Anxiety   . GERD (gastroesophageal reflux disease)     not needing medication at thhis time- 01/22/15  . Stroke     TIA and Stroke no residual effect- 1st was 23  . Chronic diastolic  congestive heart failure   . 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 3/81/0175    33 mm St Jude bileaflet mechanical valve placed via right mini thoracotomy approach    Family History  family history includes Cancer in his maternal aunt and maternal uncle; Diabetes in his father; Heart disease in his mother; Hypertension in his other; Stroke in his other; Throat cancer in his mother.  Prior Rehab/Hospitalizations:  Has the patient had major surgery during 100 days prior to admission? No  Current Medications   Current facility-administered medications:  .  0.9 %  sodium chloride infusion, 250 mL, Intravenous, PRN, Rexene Alberts, MD .  allopurinol (ZYLOPRIM) tablet 300 mg, 300 mg, Oral, Daily, Rexene Alberts, MD, 300 mg at 02/08/15 1131 .  antiseptic oral rinse (CPC / CETYLPYRIDINIUM CHLORIDE 0.05%) solution 7 mL, 7 mL, Mouth Rinse, BID, Rexene Alberts, MD, 7 mL at 02/08/15 1131 .  aspirin EC tablet 81 mg, 81 mg, Oral, Daily, 81 mg at 02/08/15 1129 **OR** [DISCONTINUED] aspirin chewable tablet 324 mg, 324 mg, Per Tube, Daily, Rexene Alberts, MD, 324 mg at 02/01/15 0939 .  atenolol (TENORMIN) tablet 50 mg, 50 mg, Oral, BID, Rexene Alberts, MD, 50 mg at 02/08/15 1131 .  busPIRone (BUSPAR) tablet 7.5 mg, 7.5 mg, Oral, BID, Rexene Alberts, MD, 7.5 mg at 02/08/15 1132 .  collagenase (SANTYL) ointment, , Topical, Daily, Rexene Alberts, MD .  digoxin [facility]) tablet 0.25 mg, 0.25 mg, Oral, Daily, Rexene Alberts, MD, 0.25 mg at 02/08/15 1130 .  fentaNYL (SUBLIMAZE) injection 12.5-25 mcg, 12.5-25 mcg, Intravenous, Q2H PRN, Wilhelmina Mcardle, MD, 25 mcg at 02/08/15 0844 .  furosemide (LASIX) tablet 40 mg, 40 mg, Oral, BID, Rexene Alberts, MD, 40 mg at 02/08/15 0850 .  Gerhardt's butt cream, , Topical, PRN, Rexene Alberts, MD .  insulin aspart (novoLOG) injection 0-20 Units, 0-20 Units, Subcutaneous, TID WC, Rexene Alberts, MD, 0 Units at 02/03/15 1230 .   levalbuterol (XOPENEX) nebulizer solution 0.63 mg, 0.63 mg, Nebulization, PRN, Rexene Alberts, MD .  metoprolol (LOPRESSOR) injection 2.5-5 mg, 2.5-5 mg, Intravenous, Q2H PRN, Rexene Alberts, MD .  metroNIDAZOLE (FLAGYL) tablet 500 mg, 500 mg, Oral, Q8H, Rexene Alberts, MD, 500 mg at 02/08/15 1129 .  ondansetron (  ZOFRAN) injection 4 mg, 4 mg, Intravenous, Q6H PRN, Rexene Alberts, MD .  potassium chloride (K-DUR) CR tablet 20 mEq, 20 mEq, Oral, BID, Rexene Alberts, MD, 20 mEq at 02/08/15 1129 .  risperiDONE (RISPERDAL) tablet 0.25 mg, 0.25 mg, Oral, BID, Brand Males, MD, 0.25 mg at 02/08/15 1130 .  sodium chloride 0.9 % injection 10-40 mL, 10-40 mL, Intracatheter, Q12H, Rexene Alberts, MD, 10 mL at 02/07/15 0924 .  sodium chloride 0.9 % injection 10-40 mL, 10-40 mL, Intracatheter, PRN, Rexene Alberts, MD, 10 mL at 02/08/15 0453 .  sodium chloride 0.9 % injection 3 mL, 3 mL, Intravenous, Q12H, Rexene Alberts, MD, 0 mL at 02/07/15 1000 .  sodium chloride 0.9 % injection 3 mL, 3 mL, Intravenous, PRN, Rexene Alberts, MD .  temazepam (RESTORIL) capsule 7.5 mg, 7.5 mg, Oral, QHS, Brand Males, MD .  warfarin (COUMADIN) tablet 5 mg, 5 mg, Oral, q1800, John Giovanni, PA-C .  Warfarin - Physician Dosing Inpatient, , Does not apply, q1800, Wynell Balloon, RPH .  white petrolatum (VASELINE) gel, , Topical, PRN, Rexene Alberts, MD, 0.2 application at 20/25/42 1749  Patients Current Diet: Diet heart healthy/carb modified Room service appropriate?: Yes; Fluid consistency:: Thin  Precautions / Restrictions Precautions Precautions: Fall Restrictions Weight Bearing Restrictions: No   Has the patient had 2 or more falls or a fall with injury in the past year?No  Prior Activity Level Community (5-7x/wk): Independent and driving pta. 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 Assists with wife for cooking and laundry due to her vision. He drives. Chronic LE edema and used  Flexitouch machine nightly.   Home Assistive Devices / Equipment Home Assistive Devices/Equipment: Eyeglasses, CPAP, CBG Meter  Prior Device Use: Indicate devices/aids used by the patient prior to current illness, exacerbation or injury? Motorized wheelchair or scooter when  In community. Otherwise no AD in home.  Prior Functional Level Prior Function Level of Independence: Independent Gait / Transfers Assistance Needed: per pt walked household distances only; used motorized cart at the store ADL's / Homemaking Assistance Needed: Pt reports wife assisted him with LB dressing  Comments: wife assisted his wife due to she is legally blind. wife assisted with LB due to his LE edema and chronic back pain  Self Care: Did the patient need help bathing, dressing, using the toilet or eating?  Needed some help wife assists with LB bathing and dressing due to chronic back issues and LE edema issues  Indoor Mobility: Did the patient need assistance with walking from room to room (with or without device)? Independent  Stairs: Did the patient need assistance with internal or external stairs (with or without device)? Independent  Functional Cognition: Did the patient need help planning regular tasks such as shopping or remembering to take medications? Independent  Current Functional Level Cognition  Overall Cognitive Status: Impaired/Different from baseline Orientation Level: Oriented to person, Oriented to place, Oriented to situation Safety/Judgement: Decreased awareness of safety, Decreased awareness of deficits General Comments: Pt with intermittent confusion     Extremity Assessment (includes Sensation/Coordination)  Upper Extremity Assessment: Generalized weakness  Lower Extremity Assessment: Defer to PT evaluation RLE Deficits / Details: strength hips and knees grossly 3+ to 4; ankle DF 5/5; hemosiderin staining lower leg LLE Deficits / Details: strength hips and knees grossly 3+ to 4;  ankle DF 5/5; hemosiderin staining lower leg    ADLs  Overall ADL's : Needs assistance/impaired Eating/Feeding: Independent, Sitting Grooming:  Wash/dry hands, Wash/dry face, Oral care, Set up, Sitting Upper Body Bathing: Minimal assitance, Sitting Lower Body Bathing: Sit to/from stand, Maximal assistance Upper Body Dressing : Moderate assistance, Sitting Lower Body Dressing: Maximal assistance, Sit to/from stand Lower Body Dressing Details (indicate cue type and reason): Pt able to bed forward to push sock off of heel  Toilet Transfer: Minimal assistance, +2 for safety/equipment Toilet Transfer Details (indicate cue type and reason): Pt is mildly impulsive.  Requires cues and assist for safety.   Toileting- Clothing Manipulation and Hygiene: Maximal assistance, Sit to/from stand Toileting - Clothing Manipulation Details (indicate cue type and reason): Pt incontinent of stool while using urinal.  Assist for peri care  Functional mobility during ADLs: Minimal assistance, +2 for safety/equipment    Mobility  Overal bed mobility: Needs Assistance, +2 for physical assistance, + 2 for safety/equipment Bed Mobility: Supine to Sit Supine to sit: Min assist, HOB elevated General bed mobility comments: Pt  up in chair and returned to chair    Transfers  Overall transfer level: Needs assistance Equipment used: Rolling walker (2 wheeled) Transfers: Sit to/from Stand Sit to Stand: Min assist, +2 safety/equipment Stand pivot transfers: Mod assist, +2 physical assistance, +2 safety/equipment General transfer comment: vc for safe use of RW; transfer x 3 with cues needed each time    Ambulation / Gait / Stairs / Wheelchair Mobility  Ambulation/Gait Ambulation/Gait assistance: Min assist, +2 safety/equipment Ambulation Distance (Feet): 100 Feet (seated rest, 100, sit, 100) Assistive device: Rolling walker (2 wheeled) General Gait Details: flexed posture, vc for safe use of RW; steady assist with  occasional stagger/drift to his left Gait Pattern/deviations: Step-through pattern, Decreased stride length, Staggering left, Drifts right/left, Trunk flexed Gait velocity: decr    Posture / Balance Dynamic Sitting Balance Sitting balance - Comments: minguard assist jue to slight post lean Balance Overall balance assessment: Needs assistance Sitting-balance support: No upper extremity supported, Feet unsupported Sitting balance-Leahy Scale: Poor Sitting balance - Comments: minguard assist jue to slight post lean Postural control: Posterior lean Standing balance support: Bilateral upper extremity supported Standing balance-Leahy Scale: Poor    Special needs/care consideration BiPAP/CPAP yes pta  Melody Gearlean Alf, RN Registered Nurse Signed WOC Consult Note 02/06/2015 10:01 AM    Expand All Collapse All   WOC wound consult note Reason for Consult: evaluation of sacrum Wound type: deep in gluteal cleft and moisture has been an issue. I feel this is a fissure related to moisture rather than pressure. Pressure Ulcer POA: No Measurement: 1.7cm x 2.0cm x 0.1cm  Wound bed:50% yellow/50% soft black  Drainage (amount, consistency, odor) none noted at the time of my assessment Periwound: intact  Dressing procedure/placement/frequency: Add enzymatic debridement ointment to the wound bed, apply dry dressing into the gluteal cleft. Monitor for changes. Turn and reposition frequently.   Discussed POC with patient and bedside nurse.  Re consult if needed, will not follow at this time. Thanks Melody Kellogg, CWOCN [phone])               O2 at 2 liters nasal cannula Skin  Chronic BLE discoloration and edema                           Bowel mgmt: diarhea and incontinence due to cdiff Bladder mgmt: incontinence with use of male urinal. Acute RN requests use of male urinal Diabetic mgmt yes DM pta Contact precautions   Previous Home Environment Living Arrangements:  Spouse/significant other  Lives With: Spouse Available Help at Discharge: Family, Available 24 hours/day Type of Home: Mobile home Home Layout: One level Home Access: Stairs to enter Entrance Stairs-Rails: Right Entrance Stairs-Number of Steps: 5-6 Bathroom Shower/Tub: Multimedia programmer: Standard Bathroom Accessibility: Yes How Accessible: Accessible via walker Home Care Services: No Additional Comments: Pt reports that wife is 4'9" and can oly do minimal lifting.  She has visual deficits and pt has to help with with visual tasks.  He is the driver in the family.  They have investigated public transportation, but [employer] [place] service [place] area of the [place]   Discharge Living Setting Plans for Discharge Living Setting: Patient's home, Lives with (comment), Other (Comment) (wife) Type of Home at Discharge: Mobile home Discharge Home Layout: One level Discharge Home Access: Stairs to enter Entrance Stairs-Rails: Right Entrance Stairs-Number of Steps: 5-6 Discharge Bathroom Shower/Tub: Walk-in shower Discharge Bathroom Toilet: Standard Discharge Bathroom Accessibility: Yes How Accessible: Accessible via walker Does the patient have any problems obtaining your medications?: No  Social/Family/Support Systems Patient Roles: Spouse, Other (Comment), Caregiver (wife legally blind and HOH, pt assists her ) Contact Information: [place], wife Anticipated Caregiver: wife Anticipated Caregiver's Contact Information: see above Ability/Limitations of Caregiver: wife legally blind  Caregiver Availability: 24/7 Discharge Plan Discussed with Primary Caregiver: Yes Is Caregiver In Agreement with Plan?: Yes Does Caregiver/Family have Issues with Lodging/Transportation while Pt is in Rehab?: Yes (Wife stayed 24/7 in ICU lounge when pt in ICU. She has frien) I have requested wife on 6/9 to limit her visitation daily to allow pt to increase his independence prior to d/c. Wife can be  perceived as demanding and abrasive at times. Wife gets transportation by friends due to her legal blindness.  Goals/Additional Needs Patient/Family Goal for Rehab: supervision with PT, OT, and SLP Expected length of stay: [facility] 7-10 days Equipment Needs: Pt uses [facility] machine daily for one hour with help of his wife  Special Service Needs: Ive discussed with wife to limit her time here until right before pt goes home to increase his independence and self sufficency Additional Information: Wife can be perceived as demanding and overbearing to staff. Best to set clear limits. Ive asked her to visit daily, but not to spend extensive time daily to increase his independence before d/c Pt/Family Agrees to Admission and willing to participate: Yes Program Orientation Provided & Reviewed with Pt/Caregiver Including Roles  & Responsibilities: Yes  Decrease burden of Care through IP rehab admission: n/a  Possible need for SNF placement upon discharge: I have discussed with pt and wife that if he doesn't progress to level of supervision at home in 2 weeks or less, that he would not just remain inpt at [facility], we would then look for SNF to give a longer recovery period. I discussed this on 02/08/15 prior to admission.  Patient Condition: This patient's medical and functional status has changed since the consult dated: 02/05/2015 in which the Rehabilitation Physician determined and documented that the patient's condition is appropriate for intensive rehabilitative care in an inpatient rehabilitation facility. See "History of Present Illness" (above) for medical update. Functional changes are: overall min to mod assist. Patient's medical and functional status update has been discussed with the Rehabilitation physician and patient remains appropriate for inpatient rehabilitation. Will admit to inpatient rehab today.  Preadmission Screen Completed By:  Cleatrice Burke, 02/08/2015 2:55  PM ______________________________________________________________________   Discussed status with Dr. Letta Pate on 02/08/2015 at  1455  and received telephone approval for admission today.  Admission Coordinator:  Cleatrice Burke, time 6147 Date 02/08/2015.

## 2015-02-08 NOTE — Progress Notes (Signed)
Dr. Roxy Manns notified of run of [place]. Pt asymptomatic. No new orders.

## 2015-02-08 NOTE — Clinical Social Work Note (Signed)
Patient was accepted to inpatient rehab, insurance approval has been received.  CSW to sign off, please reconsult if social work needs arise.    Jones Broom. [place], MSW, [place] 02/08/2015 4:40 PM

## 2015-02-08 NOTE — Progress Notes (Signed)
I met with pt and his wife at bedside to discuss inpt rehab admission. We discussed difference in cost for inpt rehab and SNF. They prefer inpt rehab and agreeable to financial cost. I have a bed today and they are in agreement to admit today. I discussed with RNCM and will make the arrangements to admit today. [phone]

## 2015-02-08 NOTE — Progress Notes (Signed)
Pt has been voiding frequently all day presumably related to diuretic therapy. Due to his anatomy he has great difficulty catching his urine in the urinal. Most of his output was on the floor. Tonight he complained feeling like he can't empty his bladder completely. A bladder scan revealed 600 ml residual. Dr. Roxy Manns notified of pt complaint and residual. He is reluctant to insert another catheter and would like to have pt continue to try and empty his bladder and to do an in and out cath only if necessary.  In and out cath offered to pt. Pt wants to do everything to avoid a catheter. After sitting on the toilet for 15 minutes he was able to urinate 300 ml in the urinal and more in the toilet and on the floor. He says that he feels better. He will continue to work on emptying his bladder.

## 2015-02-08 NOTE — Progress Notes (Signed)
PMR Admission Coordinator Pre-Admission Assessment  Lance Cook: Lance Cook is an 54 y.o., male MRN: [id number] DOB: 11-29-1960 Height: 5\' 1"  (154.9 cm) Weight: 104.826 kg (231 lb 1.6 oz)  Insurance Information HMO: yes PPO: PCP: IPA: 80/20: OTHER: medicare advantage plan PRIMARY: [employer] Medicare Policy#: [id number] Subscriber: Lance Cook CM Name: Fredderick Phenix Phone#: [phone] ext [phone] Fax#: [phone] Pre-Cert#: [id number] Employer: disabled approved for 14 days until 6/23 Benefits: Phone #: [phone] Name: 6/7 Eff. Date: 09/01/12 Deduct: none Out of Pocket Max: $5500 Life Max: none CIR: $275 per day days 1-7 then covers 100% SNF: no cpay days 1-20; $160 per day days 21-100 Outpatient: $40 copay per visit Co-Pay: no visit limit Home Health: 100% Co-Pay: no visit limit DME: 80% Co-Pay: 20% Providers: in network  SECONDARY: none   Medicaid Application Date: Case Manager:  Disability Application Date: Case Worker:  Wife complains of not having Medicaid to assist. I requested her on 6/7 to follow up with eligibility for Medicaid. She states she called, and denied.  Emergency Contact Information Contact Information    Name Relation Home Work Mobile   Aman,[name] Spouse [phone]  [phone]     Current Medical History  Lance Cook Admitting Diagnosis: Deconditioning after mitral valve replacement 01/24/2015 in a Lance Cook with morbid obesity and postoperative delirium  History of Present Illness: Lance Cook is a 54 y.o. male with history of CAF, PVD, CAD s/p CAD with AVR '99, severe MR who was admitted for minimally invasive MVR on 01/24/15 by Dr. Roxy Manns. Post op with hypoxemia due to edema v/s ARDS and required prolonged vent support. He was  treated with aggressive diuresis and possible HCAP. He has had issues with mucous plugging as well as c diff colitis and was started on metronidazole on 05/31. He tolerated extubation with prn BIPAP use on 06/03. He has had worsening of ICU delirium with confusion and hallucinations 6/05 and Risperdal was added to help with symptoms.   Wife reports chronic use of temazepan for insomnia, buspar, robaxin and oxycodone. CCM feels likely contributed to delirium which is resolving.   Past Medical History  Past Medical History  Diagnosis Date  . Hyperlipidemia   . Hypertension   . Arthritis   . Leg pain 06/28/2010  . Hiatal hernia   . Atrial fibrillation     chronic persistent  . Reflux 11/06/09  . Myocardial infarction age 68  . Varicose veins   . Obesity   . Peripheral vascular disease   . Gout   . CAD (coronary artery disease)     Old scar inferior wall myoview, 10/2009 EF 52%  . Chronic LBP 10/08/2011  . Impaired glucose tolerance 10/08/2011  . Erectile dysfunction 10/08/2011  . Testicular cancer   . Bell's palsy   . TIA (transient ischemic attack)     age 64  . CVA (cerebral infarction)     54yo  . OSA (obstructive sleep apnea)     CPAP  . History of colon polyps   . Anemia   . Hypogonadism male 04/08/2012  . Aortic aneurysm   . Aortic aneurysm and dissection   . 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 Ascending aortic dissection 07/14/2008    Localized dissection of ascending aorta noted on CTA in 2009 and stable on CTA in 2011  . Severe mitral regurgitation 10/11/2014  . Morbid obesity 04/02/2014  . Diabetes mellitus  diet controlled  . DIABETES MELLITUS, TYPE II 11/01/2009    Qualifier: Diagnosis of By: [name] MD, [name]   . Dysrhythmia     . CHF (congestive heart failure)   . Shortness of breath dyspnea     with exertion  . Anxiety   . GERD (gastroesophageal reflux disease)     not needing medication at thhis time- 01/22/15  . Stroke     TIA and Stroke no residual effect- 1st was 63  . Chronic diastolic congestive heart failure   . S/P 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 0/73/7106    33 mm St Jude bileaflet mechanical valve placed via right mini thoracotomy approach    Family History  family history includes Cancer in his maternal aunt and maternal uncle; Diabetes in his father; Heart disease in his mother; Hypertension in his other; Stroke in his other; Throat cancer in his mother.  Prior Rehab/Hospitalizations:  Has the Lance Cook had major surgery during 100 days prior to admission? No  Current Medications   Current facility-administered medications:  . 0.9 % sodium chloride infusion, 250 mL, Intravenous, PRN, Rexene Alberts, MD . allopurinol (ZYLOPRIM) tablet 300 mg, 300 mg, Oral, Daily, Rexene Alberts, MD, 300 mg at 02/08/15 1131 . antiseptic oral rinse (CPC / CETYLPYRIDINIUM CHLORIDE 0.05%) solution 7 mL, 7 mL, Mouth Rinse, BID, Rexene Alberts, MD, 7 mL at 02/08/15 1131 . aspirin EC tablet 81 mg, 81 mg, Oral, Daily, 81 mg at 02/08/15 1129 **OR** [DISCONTINUED] aspirin chewable tablet 324 mg, 324 mg, Per Tube, Daily, Rexene Alberts, MD, 324 mg at 02/01/15 0939 . atenolol (TENORMIN) tablet 50 mg, 50 mg, Oral, BID, Rexene Alberts, MD, 50 mg at 02/08/15 1131 . busPIRone (BUSPAR) tablet 7.5 mg, 7.5 mg, Oral, BID, Rexene Alberts, MD, 7.5 mg at 02/08/15 1132 . collagenase (SANTYL) ointment, , Topical, Daily, Rexene Alberts, MD . digoxin [employer]) tablet 0.25 mg, 0.25 mg, Oral, Daily, Rexene Alberts, MD, 0.25 mg at 02/08/15 1130 . fentaNYL (SUBLIMAZE) injection 12.5-25 mcg, 12.5-25 mcg, Intravenous, Q2H PRN, Wilhelmina Mcardle, MD, 25 mcg at  02/08/15 0844 . furosemide (LASIX) tablet 40 mg, 40 mg, Oral, BID, Rexene Alberts, MD, 40 mg at 02/08/15 0850 . Gerhardt's butt cream, , Topical, PRN, Rexene Alberts, MD . insulin aspart (novoLOG) injection 0-20 Units, 0-20 Units, Subcutaneous, TID WC, Rexene Alberts, MD, 0 Units at 02/03/15 1230 . levalbuterol (XOPENEX) nebulizer solution 0.63 mg, 0.63 mg, Nebulization, PRN, Rexene Alberts, MD . metoprolol (LOPRESSOR) injection 2.5-5 mg, 2.5-5 mg, Intravenous, Q2H PRN, Rexene Alberts, MD . metroNIDAZOLE (FLAGYL) tablet 500 mg, 500 mg, Oral, Q8H, Rexene Alberts, MD, 500 mg at 02/08/15 1129 . ondansetron (ZOFRAN) injection 4 mg, 4 mg, Intravenous, Q6H PRN, Rexene Alberts, MD . potassium chloride (K-DUR) CR tablet 20 mEq, 20 mEq, Oral, BID, Rexene Alberts, MD, 20 mEq at 02/08/15 1129 . risperiDONE (RISPERDAL) tablet 0.25 mg, 0.25 mg, Oral, BID, Brand Males, MD, 0.25 mg at 02/08/15 1130 . sodium chloride 0.9 % injection 10-40 mL, 10-40 mL, Intracatheter, Q12H, Rexene Alberts, MD, 10 mL at 02/07/15 0924 . sodium chloride 0.9 % injection 10-40 mL, 10-40 mL, Intracatheter, PRN, Rexene Alberts, MD, 10 mL at 02/08/15 0453 . sodium chloride 0.9 % injection 3 mL, 3 mL, Intravenous, Q12H, Rexene Alberts, MD, 0 mL at 02/07/15 1000 . sodium chloride 0.9 % injection 3 mL, 3 mL, Intravenous, PRN, Rexene Alberts, MD .  temazepam (RESTORIL) capsule 7.5 mg, 7.5 mg, Oral, QHS, Brand Males, MD . warfarin (COUMADIN) tablet 5 mg, 5 mg, Oral, q1800, Wayne E Gold, PA-C . Warfarin - Physician Dosing Inpatient, , Does not apply, q1800, Wynell Balloon, RPH . white petrolatum (VASELINE) gel, , Topical, PRN, Rexene Alberts, MD, 0.2 application at 42/35/36 1749  Patients Current Diet: Diet heart healthy/carb modified Room service appropriate?: Yes; Fluid consistency:: Thin  Precautions / Restrictions Precautions Precautions: Fall Restrictions Weight Bearing Restrictions: No    Has the Lance Cook had 2 or more falls or a fall with injury in the past year?No  Prior Activity Level Community (5-7x/wk): Independent and driving pta. 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 Assists with wife for cooking and laundry due to her vision. He drives. Chronic LE edema and used Flexitouch machine nightly.   Home Assistive Devices / Equipment Home Assistive Devices/Equipment: Eyeglasses, CPAP, CBG Meter  Prior Device Use: Indicate devices/aids used by the Lance Cook prior to current illness, exacerbation or injury? Motorized wheelchair or scooter when In community. Otherwise no AD in home.  Prior Functional Level Prior Function Level of Independence: Independent Gait / Transfers Assistance Needed: per Lance Cook walked household distances only; used motorized cart at the store ADL's / Homemaking Assistance Needed: Lance Cook reports wife assisted him with LB dressing  Comments: wife assisted his wife due to she is legally blind. wife assisted with LB due to his LE edema and chronic back pain  Self Care: Did the Lance Cook need help bathing, dressing, using the toilet or eating? Needed some help wife assists with LB bathing and dressing due to chronic back issues and LE edema issues  Indoor Mobility: Did the Lance Cook need assistance with walking from room to room (with or without device)? Independent  Stairs: Did the Lance Cook need assistance with internal or external stairs (with or without device)? Independent  Functional Cognition: Did the Lance Cook need help planning regular tasks such as shopping or remembering to take medications? Independent  Current Functional Level Cognition  Overall Cognitive Status: Impaired/Different from baseline Orientation Level: Oriented to person, Oriented to place, Oriented to situation Safety/Judgement: Decreased awareness of safety, Decreased awareness of deficits General Comments: Lance Cook with intermittent confusion    Extremity  Assessment (includes Sensation/Coordination)  Upper Extremity Assessment: Generalized weakness  Lower Extremity Assessment: Defer to Lance Cook evaluation RLE Deficits / Details: strength hips and knees grossly 3+ to 4; ankle DF 5/5; hemosiderin staining lower leg LLE Deficits / Details: strength hips and knees grossly 3+ to 4; ankle DF 5/5; hemosiderin staining lower leg    ADLs  Overall ADL's : Needs assistance/impaired Eating/Feeding: Independent, Sitting Grooming: Wash/dry hands, Wash/dry face, Oral care, Set up, Sitting Upper Body Bathing: Minimal assitance, Sitting Lower Body Bathing: Sit to/from stand, Maximal assistance Upper Body Dressing : Moderate assistance, Sitting Lower Body Dressing: Maximal assistance, Sit to/from stand Lower Body Dressing Details (indicate cue type and reason): Lance Cook able to bed forward to push sock off of heel  Toilet Transfer: Minimal assistance, +2 for safety/equipment Toilet Transfer Details (indicate cue type and reason): Lance Cook is mildly impulsive. Requires cues and assist for safety.  Toileting- Clothing Manipulation and Hygiene: Maximal assistance, Sit to/from stand Toileting - Clothing Manipulation Details (indicate cue type and reason): Lance Cook incontinent of stool while using urinal. Assist for peri care  Functional mobility during ADLs: Minimal assistance, +2 for safety/equipment    Mobility  Overal bed mobility: Needs Assistance, +2 for physical assistance, + 2 for safety/equipment Bed  Mobility: Supine to Sit Supine to sit: Min assist, HOB elevated General bed mobility comments: Lance Cook up in chair and returned to chair    Transfers  Overall transfer level: Needs assistance Equipment used: Rolling walker (2 wheeled) Transfers: Sit to/from Stand Sit to Stand: Min assist, +2 safety/equipment Stand pivot transfers: Mod assist, +2 physical assistance, +2 safety/equipment General transfer comment: vc for safe use of RW; transfer x 3 with cues  needed each time    Ambulation / Gait / Stairs / Wheelchair Mobility  Ambulation/Gait Ambulation/Gait assistance: Min assist, +2 safety/equipment Ambulation Distance (Feet): 100 Feet (seated rest, 100, sit, 100) Assistive device: Rolling walker (2 wheeled) General Gait Details: flexed posture, vc for safe use of RW; steady assist with occasional stagger/drift to his left Gait Pattern/deviations: Step-through pattern, Decreased stride length, Staggering left, Drifts right/left, Trunk flexed Gait velocity: decr    Posture / Balance Dynamic Sitting Balance Sitting balance - Comments: minguard assist jue to slight post lean Balance Overall balance assessment: Needs assistance Sitting-balance support: No upper extremity supported, Feet unsupported Sitting balance-Leahy Scale: Poor Sitting balance - Comments: minguard assist jue to slight post lean Postural control: Posterior lean Standing balance support: Bilateral upper extremity supported Standing balance-Leahy Scale: Poor    Special needs/care consideration BiPAP/CPAP yes pta  Melody Gearlean Alf, RN Registered Nurse Signed WOC Consult Note 02/06/2015 10:01 AM    Expand All Collapse All  WOC wound consult note Reason for Consult: evaluation of sacrum Wound type: deep in gluteal cleft and moisture has been an issue. I feel this is a fissure related to moisture rather than pressure. Pressure Ulcer POA: No Measurement: 1.7cm x 2.0cm x 0.1cm  Wound bed:50% yellow/50% soft black  Drainage (amount, consistency, odor) none noted at the time of my assessment Periwound: intact  Dressing procedure/placement/frequency: Add enzymatic debridement ointment to the wound bed, apply dry dressing into the gluteal cleft. Monitor for changes. Turn and reposition frequently.   Discussed POC with Lance Cook and bedside nurse.  Re consult if needed, will not follow at this time. Thanks Melody Kellogg, CWOCN [phone])                O2 at 2 liters nasal cannula Skin Chronic BLE discoloration and edema  Bowel mgmt: diarhea and incontinence due to cdiff Bladder mgmt: incontinence with use of male urinal. Acute RN requests use of male urinal Diabetic mgmt yes DM pta Contact precautions   Previous Home Environment Living Arrangements: Spouse/significant other Lives With: Spouse Available Help at Discharge: Family, Available 24 hours/day Type of Home: Mobile home Home Layout: One level Home Access: Stairs to enter Entrance Stairs-Rails: Right Entrance Stairs-Number of Steps: 5-6 Bathroom Shower/Tub: Multimedia programmer: Standard Bathroom Accessibility: Yes How Accessible: Accessible via walker Home Care Services: No Additional Comments: Lance Cook reports that wife is 4'9" and can oly do minimal lifting. She has visual deficits and Lance Cook has to help with with visual tasks. He is the driver in the family. They have investigated public transportation, but [facility] [place] not service [place] area of the [place]   Discharge Living Setting Plans for Discharge Living Setting: Lance Cook's home, Lives with (comment), Other (Comment) (wife) Type of Home at Discharge: Mobile home Discharge Home Layout: One level Discharge Home Access: Stairs to enter Entrance Stairs-Rails: Right Entrance Stairs-Number of Steps: 5-6 Discharge Bathroom Shower/Tub: Walk-in shower Discharge Bathroom Toilet: Standard Discharge Bathroom Accessibility: Yes How Accessible: Accessible via walker Does the Lance Cook have any problems obtaining your medications?: No  Social/Family/Support Systems Lance Cook Roles: Spouse, Other (Comment), Caregiver (wife legally blind and HOH, Lance Cook assists her ) Contact Information: [place], wife Anticipated Caregiver: wife Anticipated Caregiver's Contact Information: see above Ability/Limitations of Caregiver: wife legally blind  Caregiver Availability: 24/7 Discharge  Plan Discussed with Primary Caregiver: Yes Is Caregiver In Agreement with Plan?: Yes Does Caregiver/Family have Issues with Lodging/Transportation while Lance Cook is in Rehab?: Yes (Wife stayed 24/7 in ICU lounge when Lance Cook in ICU. She has frien) I have requested wife on 6/9 to limit her visitation daily to allow Lance Cook to increase his independence prior to d/c. Wife can be perceived as demanding and abrasive at times. Wife gets transportation by friends due to her legal blindness.  Goals/Additional Needs Lance Cook/Family Goal for Rehab: supervision with Lance Cook, OT, and SLP Expected length of stay: ELOS 7-10 days Equipment Needs: Lance Cook uses [facility] machine daily for one hour with help of his wife  Special Service Needs: Ive discussed with wife to limit her time here until right before Lance Cook goes home to increase his independence and self sufficency Additional Information: Wife can be perceived as demanding and overbearing to staff. Best to set clear limits. Ive asked her to visit daily, but not to spend extensive time daily to increase his independence before d/c Lance Cook/Family Agrees to Admission and willing to participate: Yes Program Orientation Provided & Reviewed with Lance Cook/Caregiver Including Roles & Responsibilities: Yes  Decrease burden of Care through IP rehab admission: n/a  Possible need for SNF placement upon discharge: I have discussed with Lance Cook and wife that if he doesn't progress to level of supervision at home in 2 weeks or less, that he would not just remain inpt at [facility], we would then look for SNF to give a longer recovery period. I discussed this on 02/08/15 prior to admission.  Lance Cook Condition: This Lance Cook's medical and functional status has changed since the consult dated: 02/05/2015 in which the Rehabilitation Physician determined and documented that the Lance Cook's condition is appropriate for intensive rehabilitative care in an inpatient rehabilitation facility. See "History of Present Illness" (above) for  medical update. Functional changes are: overall min to mod assist. Lance Cook's medical and functional status update has been discussed with the Rehabilitation physician and Lance Cook remains appropriate for inpatient rehabilitation. Will admit to inpatient rehab today.  Preadmission Screen Completed By: Cleatrice Burke, 02/08/2015 2:55 PM ______________________________________________________________________  Discussed status with Dr. Letta Pate on 02/08/2015 at 1455 and received telephone approval for admission today.  Admission Coordinator: Cleatrice Burke, time 9767 Date 02/08/2015.          Cosigned by: Charlett Blake, MD at 02/08/2015 3:24 PM  Revision History     Date/Time User Provider Type Action   02/08/2015 3:24 PM Charlett Blake, MD Physician Cosign   02/08/2015 3:01 PM Cristina Gong, RN Rehab Admission Coordinator Sign   02/08/2015 2:56 PM Cristina Gong, RN Rehab Admission Coordinator Sign   View Details Report

## 2015-02-08 NOTE — Progress Notes (Signed)
PT Cancellation Note  Patient Details Name: Lance Cook MRN: [id number] DOB: [birth year]   Cancelled Treatment:    Reason Eval/Treat Not Completed: Medical issues which prohibited therapy;Other (comment)  Patient reports he has been up ambulating x2 today, and is to go to CIR today.  Also reports he is having diarrhea this am and would rather not walk in hallway again.  Declines PT at this time. Despina Pole 02/08/2015, 4:54 PM Carita Pian. Sanjuana Kava, [place] Pager [phone]

## 2015-02-08 NOTE — Progress Notes (Signed)
Placed patient on home CPAP for the night  

## 2015-02-08 NOTE — Progress Notes (Signed)
Pt transferred to inpatient rehab unit. Report given to receiving nurse.

## 2015-02-08 NOTE — H&P (Signed)
Physical Medicine and Rehabilitation Admission H&P   CC: Deconditioning.   HPI: Lance Cook is a 54 y.o. male with history of CAF, PVD, TIA's, CAD s/p CAD with AVR '99, severe MR who was admitted for minimally invasive MVR on 01/24/15 by Dr. Roxy Manns. Post op with hypoxemia due to edema v/s ARDS and required prolonged vent support. He was treated with aggressive diuresis as well as IV antibiotics for possible HCAP. He has had issues with mucous plugging as well as c diff colitis and was started on metronidazole on 05/31. He tolerated extubation with prn BIPAP use on 06/03. He has had worsening of ICU delirium with confusion and hallucinations 6/05 and Risperdal was added with improvement in mentation. Fluid overload is improving with diuresis and he's is being weaned off oxygen. Rectal tube d/c as diarrhea has resolved. A fib is rate controlled. Patient noted to be severely deconditioned and CIR was recommended by MD and Rehab team.   Patient seen at bedside with his wife present. Review of Systems  HENT: Negative for hearing loss.  Eyes: Negative for double vision.  Respiratory: Negative for cough and shortness of breath.  Cardiovascular: Positive for leg swelling (at baseline currently). Negative for chest pain and palpitations.  Gastrointestinal: Positive for diarrhea. Negative for nausea, vomiting and abdominal pain.   Buttock pain--still feels like "the tube is in there"  Genitourinary: Positive for urgency and frequency.  Musculoskeletal: Positive for myalgias and back pain (chronic).  Neurological: Positive for sensory change. Negative for dizziness, speech change and headaches.  Endo/Heme/Allergies: Bruises/bleeds easily.  Psychiatric/Behavioral: Positive for memory loss. The patient is nervous/anxious and has insomnia.      Past Medical History  Diagnosis Date  . Hyperlipidemia   . Hypertension   . Arthritis   . Leg pain 06/28/2010  . Hiatal hernia    . Atrial fibrillation     chronic persistent  . Reflux 11/06/09  . Myocardial infarction age 58  . Varicose veins   . Obesity   . Peripheral vascular disease   . Gout   . CAD (coronary artery disease)     Old scar inferior wall myoview, 10/2009 EF 52%  . Chronic LBP 10/08/2011  . Impaired glucose tolerance 10/08/2011  . Erectile dysfunction 10/08/2011  . Testicular cancer   . Bell's palsy   . TIA (transient ischemic attack)     age 46  . CVA (cerebral infarction)     54yo  . OSA (obstructive sleep apnea)     CPAP  . History of colon polyps   . Anemia   . Hypogonadism male 04/08/2012  . Aortic aneurysm   . Aortic aneurysm and dissection   . 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     1988 - Dr Blase Mess at [facility] in [place], [place]  . Ascending aortic dissection 07/14/2008    Localized dissection of ascending aorta noted on CTA in 2009 and stable on CTA in 2011  . Severe mitral regurgitation 10/11/2014  . Morbid obesity 04/02/2014  . Diabetes mellitus     diet controlled  . DIABETES MELLITUS, TYPE II 11/01/2009    Qualifier: Diagnosis of By: [name] MD, [name]   . Dysrhythmia   . CHF (congestive heart failure)   . Shortness of breath dyspnea     with exertion  . Anxiety   . GERD (gastroesophageal reflux disease)     not needing medication at thhis time- 01/22/15  . Stroke  TIA and Stroke no residual effect- 1st was 41  . Chronic diastolic congestive heart failure   . S/P 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 0/17/5102    33 mm St Jude bileaflet mechanical valve placed via right mini thoracotomy approach    Past Surgical History  Procedure Laterality Date  . Laser ablation  03/06/2010    left leg  . Cardiac valve replacement       [id number]  . Cholecystectomy  2011  . Tonsillectomy  1967  . Otoplasty      bilateral, age 27  . Orchiectomy  age 26    testicular cancer  . Tee without cardioversion N/A 10/11/2014    Procedure: TRANSESOPHAGEAL ECHOCARDIOGRAM (TEE); Surgeon: Pixie Casino, MD; Location: [facility] ENDOSCOPY; Service: Cardiovascular; Laterality: N/ADeneen Harts procedure  1988    25 mm St Jude mechanical valve conduit - Dr Blase Mess at [facility] in [place], [place]  . Coronary artery bypass graft    . Aortic truck    . Tee without cardioversion N/A 01/24/2015    Procedure: TRANSESOPHAGEAL ECHOCARDIOGRAM (TEE); Surgeon: Rexene Alberts, MD; Location: [facility]; Service: Open Heart Surgery; Laterality: N/A;  . Mitral valve replacement Right 01/24/2015    Procedure: Re-Operation, MINIMALLY INVASIVE MITRAL VALVE (MV) REPLACEMENT.; Surgeon: Rexene Alberts, MD; Location: [facility]; Service: Open Heart Surgery; Laterality: Right;    Family History  Problem Relation Age of Onset  . Heart disease Mother   . Throat cancer Mother   . Diabetes Father   . Stroke Other   . Hypertension Other   . Cancer Maternal Aunt     lung, brain  . Cancer Maternal Uncle     brain aneurysm    Social History: Married. Independent but disabled due to medical issues. Wife has health problems and does not drive. Per reports that he has been smoking Cigars. He has never used smokeless tobacco. He reports that he drinks alcohol. He reports that he does not use illicit drugs.    Allergies  Allergen Reactions  . Penicillins     REACTION: Hives  . Adhesive [Tape] Other (See Comments)    Shin irritation    Medications Prior to Admission  Medication Sig Dispense Refill  . allopurinol (ZYLOPRIM) 300 MG tablet Take 300 mg by mouth daily.    Marland Kitchen atenolol (TENORMIN) 100 MG tablet Take 100 mg by  mouth 2 (two) times daily.    Marland Kitchen b complex vitamins tablet Take 1 tablet by mouth daily.     . busPIRone (BUSPAR) 7.5 MG tablet Take 7.5 mg by mouth 2 (two) times daily.     . Calcium-Magnesium-Zinc (CAL-MAG-ZINC PO) Take by mouth daily.     Marland Kitchen CARTIA XT 120 MG 24 hr capsule Take 120 mg by mouth daily.     . clindamycin (CLEOCIN) 300 MG capsule Take 2 capsules by mouth one hour prior to dental appointment. 2 capsule 1  . digoxin (LANOXIN) 0.25 MG tablet Take 1 tablet (0.25 mg total) by mouth daily. 90 tablet 3  . enoxaparin (LOVENOX) 100 MG/ML injection Inject 1 mL (100 mg total) into the skin every 12 (twelve) hours. 20 Syringe 0  . furosemide (LASIX) 40 MG tablet Take 20-40 mg by mouth daily as needed for fluid.    . methocarbamol (ROBAXIN) 500 MG tablet Take 500 mg by mouth 3 (three) times daily.     . ONE TOUCH ULTRA TEST test strip     . oxyCODONE (OXY  IR/ROXICODONE) 5 MG immediate release tablet Take 5 mg by mouth every 4 (four) hours as needed for moderate pain.     . potassium chloride (K-DUR) 10 MEQ tablet Take 10 mEq by mouth 2 (two) times daily.     . temazepam (RESTORIL) 15 MG capsule Take 15 mg by mouth at bedtime as needed for sleep.    Marland Kitchen warfarin (COUMADIN) 5 MG tablet Take 1 to 1.5 tablets by mouth daily as directed by coumadin clinic (Patient taking differently: Take 5-7.5 mg by mouth See admin instructions. 7.27m on Wednesdays, 5103mall other days) 120 tablet 1    [place] to[place] Private residence Living Arrangements: Spouse/significant other Available Help at Discharge: Family, Available 24 hours/day Type of Home: Mobile home Home Access: Stairs to enter [employer] Steps: 5-6 Entrance Stairs-Rails: Right Home Layout: One level Additional Comments: Pt reports that wife is 4'9" and can oly do minimal lifting. She has visual deficits and pt  has to help with with visual tasks. He is the driver in the family. They have investigated public transportation, but [employer] [place] not service their area of the county   Functional History: Prior Function Level of Independence: Needs assistance Gait / Transfers Assistance Needed: per pt walked household distances only; used motorized cart at the store ADL's / Homemaking Assistance Needed: Pt reports wife assisted him with LB dressing  Comments: Pt with intermittent confusion. Wife not present to confirm information   Functional Status:  Mobility: Bed Mobility Overal bed mobility: Needs Assistance, +2 for physical assistance, + 2 for safety/equipment Bed Mobility: Supine to Sit Supine to sit: Min assist, HOB elevated General bed mobility comments: Pt up in chair and returned to chair Transfers Overall transfer level: Needs assistance Equipment used: Rolling walker (2 wheeled) Transfers: Sit to/from Stand Sit to Stand: Min assist, +2 safety/equipment Stand pivot transfers: Mod assist, +2 physical assistance, +2 safety/equipment General transfer comment: vc for safe use of RW; transfer x 3 with cues needed each time Ambulation/Gait Ambulation/Gait assistance: Min assist, +2 safety/equipment Ambulation Distance (Feet): 100 Feet (seated rest, 100, sit, 100) Assistive device: Rolling walker (2 wheeled) General Gait Details: flexed posture, vc for safe use of RW; steady assist with occasional stagger/drift to his left Gait Pattern/deviations: Step-through pattern, Decreased stride length, Staggering left, Drifts right/left, Trunk flexed Gait velocity: decr    ADL: ADL Overall ADL's : Needs assistance/impaired Eating/Feeding: Independent, Sitting Grooming: Wash/dry hands, Wash/dry face, Oral care, Set up, Sitting Upper Body Bathing: Minimal assitance, Sitting Lower Body Bathing: Sit to/from stand, Maximal assistance Upper Body Dressing : Moderate assistance, Sitting Lower Body  Dressing: Maximal assistance, Sit to/from stand Lower Body Dressing Details (indicate cue type and reason): Pt able to bed forward to push sock off of heel  Toilet Transfer: Minimal assistance, +2 for safety/equipment Toilet Transfer Details (indicate cue type and reason): Pt is mildly impulsive. Requires cues and assist for safety.  Toileting- Clothing Manipulation and Hygiene: Maximal assistance, Sit to/from stand Toileting - Clothing Manipulation Details (indicate cue type and reason): Pt incontinent of stool while using urinal. Assist for peri care  Functional mobility during ADLs: Minimal assistance, +2 for safety/equipment  Cognition: Cognition Overall Cognitive Status: Impaired/Different from baseline Orientation Level: Oriented to person, Oriented to place, Oriented to situation Cognition Arousal/Alertness: Awake/alert Behavior During Therapy: WFL for tasks assessed/performed Overall Cognitive Status: Impaired/Different from baseline Area of Impairment: Safety/judgement Safety/Judgement: Decreased awareness of safety, Decreased awareness of deficits General Comments:  Pt with intermittent confusion   Physical Exam: Blood pressure 110/69, pulse 83, temperature 98.3 F (36.8 C), temperature source Oral, resp. rate 20, height _0  (1.549 m), weight 104.826 kg (231 lb 1.6 oz), SpO2 91 %. Physical Exam  Nursing note and vitals reviewed. Constitutional: He is oriented to person, place, and time. He appears well-developed and well-nourished.  Morbidly obese male.  HENT:  Head: Normocephalic and atraumatic.  Eyes: Conjunctivae are normal. Pupils are equal, round, and reactive to light.  Neck: Normal range of motion. Neck supple.  Cardiovascular: Normal rate. An irregular rhythm present.  Positive valve click .  Respiratory: Effort normal and breath sounds normal. No respiratory distress. He has no wheezes.  GI: Soft. Bowel sounds are normal. There is no tenderness.   Musculoskeletal: Edema: 2+ edema. Bilateral shins with stasis dermatitis.  Neurological: He is alert and oriented to person, place, and time.  Able to follow basic commands without difficulty. Impulsive.  Motor strength is 4/5 bilateral deltoid, biceps, triceps, grip 3 minus bilateral hip flexor 4 minus bilateral knee extensor 4 bilateral ankle dorsiflexor plantar flexor Sensation intact to light touch in bilateral upper and lower limbs  Skin: Skin is warm and dry.  Dry flaky skin bilateral shins. Intergluteal ares with abraded areas due to MASD.     Lab Results Last 48 Hours    Results for orders placed or performed during the hospital encounter of 01/24/15 (from the past 48 hour(s))  Glucose, capillary Status: None   Collection Time: 02/06/15 1:26 PM  Result Value Ref Range   Glucose-Capillary 87 65 - 99 mg/dL   Comment 1 Capillary Specimen    Comment 2 Notify RN   Glucose, capillary Status: Abnormal   Collection Time: 02/06/15 4:18 PM  Result Value Ref Range   Glucose-Capillary 126 (H) 65 - 99 mg/dL   Comment 1 Capillary Specimen    Comment 2 Notify RN   Glucose, capillary Status: None   Collection Time: 02/06/15 9:36 PM  Result Value Ref Range   Glucose-Capillary 89 65 - 99 mg/dL   Comment 1 Capillary Specimen    Comment 2 Notify RN    Comment 3 Document in Chart   Protime-INR Status: Abnormal   Collection Time: 02/07/15 3:00 AM  Result Value Ref Range   Prothrombin Time 26.4 (H) 11.6 - 15.2 seconds   INR 2.47 (H) 0.00 - 1.49  Magnesium Status: None   Collection Time: 02/07/15 3:00 AM  Result Value Ref Range   Magnesium 1.8 1.7 - 2.4 mg/dL  Phosphorus Status: Abnormal   Collection Time: 02/07/15 3:00 AM  Result Value Ref Range   Phosphorus 2.4 (L) 2.5 - 4.6 mg/dL  Basic metabolic panel Status: Abnormal   Collection Time: 02/07/15  3:00 AM  Result Value Ref Range   Sodium 138 135 - 145 mmol/L   Potassium 4.0 3.5 - 5.1 mmol/L   Chloride 97 (L) 101 - 111 mmol/L   CO2 34 (H) 22 - 32 mmol/L   Glucose, Bld 85 65 - 99 mg/dL   BUN 15 6 - 20 mg/dL   Creatinine, Ser 0.78 0.61 - 1.24 mg/dL   Calcium 8.0 (L) 8.9 - 10.3 mg/dL   GFR calc non Af Amer >60 >60 mL/min   GFR calc Af Amer >60 >60 mL/min    Comment: (NOTE) The eGFR has been calculated using the CKD EPI equation. This calculation has not been validated in all clinical situations. eGFR's persistently <60 mL/min signify possible Chronic Kidney  Disease.    Anion gap 7 5 - 15  CBC Status: Abnormal   Collection Time: 02/07/15 3:00 AM  Result Value Ref Range   WBC 10.7 (H) 4.0 - 10.5 K/uL   RBC 3.97 (L) 4.22 - 5.81 MIL/uL   Hemoglobin 10.3 (L) 13.0 - 17.0 g/dL   HCT 34.2 (L) 39.0 - 52.0 %   MCV 86.1 78.0 - 100.0 fL   MCH 25.9 (L) 26.0 - 34.0 pg   MCHC 30.1 30.0 - 36.0 g/dL   RDW 17.7 (H) 11.5 - 15.5 %   Platelets 200 150 - 400 K/uL  Glucose, capillary Status: None   Collection Time: 02/07/15 8:54 AM  Result Value Ref Range   Glucose-Capillary 91 65 - 99 mg/dL   Comment 1 Capillary Specimen    Comment 2 Notify RN   Glucose, capillary Status: None   Collection Time: 02/07/15 1:21 PM  Result Value Ref Range   Glucose-Capillary 81 65 - 99 mg/dL  Glucose, capillary Status: Abnormal   Collection Time: 02/07/15 4:55 PM  Result Value Ref Range   Glucose-Capillary 115 (H) 65 - 99 mg/dL  Glucose, capillary Status: None   Collection Time: 02/07/15 9:22 PM  Result Value Ref Range   Glucose-Capillary 84 65 - 99 mg/dL  Protime-INR Status: Abnormal   Collection Time: 02/08/15 5:23 AM  Result Value Ref Range   Prothrombin Time 30.5 (H) 11.6 - 15.2 seconds   INR 2.98 (H) 0.00 - 1.49   Magnesium Status: None   Collection Time: 02/08/15 5:23 AM  Result Value Ref Range   Magnesium 1.8 1.7 - 2.4 mg/dL  Phosphorus Status: None   Collection Time: 02/08/15 5:23 AM  Result Value Ref Range   Phosphorus 2.9 2.5 - 4.6 mg/dL  Basic metabolic panel Status: Abnormal   Collection Time: 02/08/15 5:23 AM  Result Value Ref Range   Sodium 136 135 - 145 mmol/L   Potassium 3.9 3.5 - 5.1 mmol/L   Chloride 96 (L) 101 - 111 mmol/L   CO2 32 22 - 32 mmol/L   Glucose, Bld 107 (H) 65 - 99 mg/dL   BUN 14 6 - 20 mg/dL   Creatinine, Ser 0.82 0.61 - 1.24 mg/dL   Calcium 8.1 (L) 8.9 - 10.3 mg/dL   GFR calc non Af Amer >60 >60 mL/min   GFR calc Af Amer >60 >60 mL/min    Comment: (NOTE) The eGFR has been calculated using the CKD EPI equation. This calculation has not been validated in all clinical situations. eGFR's persistently <60 mL/min signify possible Chronic Kidney Disease.    Anion gap 8 5 - 15  Glucose, capillary Status: Abnormal   Collection Time: 02/08/15 5:44 AM  Result Value Ref Range   Glucose-Capillary 112 (H) 65 - 99 mg/dL   Comment 1 Notify RN    Comment 2 Document in Chart   Glucose, capillary Status: None   Collection Time: 02/08/15 11:44 AM  Result Value Ref Range   Glucose-Capillary 79 65 - 99 mg/dL      Imaging Results (Last 48 hours)    Dg Chest 2 View  02/08/2015 CLINICAL DATA: CHF, mitral valve replacement EXAM: CHEST 2 VIEW COMPARISON: Portable chest x-ray of February 07, 2015 FINDINGS: The lungs are better inflated today although there is persistent mild hypo inflation. There is no visible right-sided pneumothorax. The pulmonary interstitial markings remain increased greater in the right mid and lower lung than elsewhere. The cardiac silhouette remains enlarged. The central pulmonary vascularity is  mildly engorged. There is a small  right pleural effusion. There are 7 intact sternal wires. The mitral valve ring is visible. The right-sided PICC line tip projects over the midportion of the SVC. The bony thorax exhibits no acute abnormality. IMPRESSION: Slight improved aeration of both lungs. Persistent mild interstitial edema bilaterally secondary to CHF. Subsegmental atelectasis in the right mid and lower lung with small effusion is present and stable. Electronically Signed By: David Martinique M.D. On: 02/08/2015 07:40   Dg Chest Port 1 View  02/07/2015 CLINICAL DATA: Respiratory failure. EXAM: PORTABLE CHEST - 1 VIEW COMPARISON: 02/05/2015. FINDINGS: Interim removal of right chest tubes. Right PICC line in stable position. Tiny right pneumothorax. No tension. Prior median sternotomy and cardiac valve replacement. Stable cardiomegaly. Stable mild bilateral pulmonary interstitial prominence. Tiny right pleural effusion cannot be excluded. IMPRESSION: 1. Interim removal of right chest tubes. Tiny right pneumothorax. No tension. Right PICC line in stable position. 2. Prior cardiac valve replacement. Stable severe cardiomegaly. 3. Stable mild bilateral pulmonary interstitial prominence. Mild interstitial edema cannot be excluded. Critical Value/emergent results were called by telephone at the time of interpretation on 02/07/2015 at 8:03 am to nurse [facility] verbally acknowledged these results. Electronically Signed By: Marcello Moores Register On: 02/07/2015 08:05        Medical Problem List and Plan: 1. Functional deficits secondary to Deconditioning after mitral valve replacement 01/24/2015 in a patient with morbid obesity and postoperative delirium 2. DVT Prophylaxis/Anticoagulation: Pharmaceutical: Coumadin 3. Chronic pain/ Pain Management: Was using oxycodone and robaxin at home. Currently rates pain as 2/10 but wants meds scheduled. Educated on using Appropriately. 4. Delirium/Mood: Resolving--question component of  drug withdrawal. Monitor with resumption of Buspar as well as restoril. LCSW to follow for  5. Neuropsych: This patient is capable of making decisions on his own behalf. 6. MASD sacrum/Skin/Wound Care: Routine pressure relief measures. Santyl with daily dressing changes to sacral MASD.  7. Fluids/Electrolytes/Nutrition: Monitor I/O. Offer supplements to help with low protein stores.  8. Fluid overload with anasarca: Improving with diuresis. Continue to monitor daily weights. 9. Chronic diastolic CHF: Monitor daily weights. Continue lasix, digoxin and atenolol.  10 PAF: Monitor heart rate every 8 hours. Continue atenolol, digoxin and coumadin.  11. OSA: Continue to use CPAP daily. 12. C diff colitis: continue to have diarrhea with bloating--used questran at home for GI symptoms. Continue Metronidazole D# 10/14. 13. ABLA: Will check H/H in am. 14. DM type 2: Diet controlled. Hgb A1C-5.9. Monitor BS with ac/hs checks. Use SSI for elevated BS.  14. Anxiety disorder: Buspar resumed today. Will monitor mood and taper off Risperdal in the next few days.  16. Chronic insomnia: Resume Restoril. 17. Leucocytosis: Likely reactive and resolving. Monitor for signs of infection. Recheck CBC in am.  18. Minimally invasive MVR: Monitor wound for healing. No need for sternal precautions.  19. Chronic peripheral edema: Does not tolerate support stocking. Does not like to elevate BLE. Will try ace wraps. Continue to encourage elevation.    Post Admission Physician Evaluation: 1. Functional deficits secondary to Deconditioning after mitral valve replacement 01/24/2015 in a patient with morbid obesity and postoperative delirium 2. Patient is admitted to receive collaborative, interdisciplinary care between the physiatrist, rehab nursing staff, and therapy team. 3. Patient's level of medical complexity and substantial therapy needs in context of that medical necessity cannot be provided at a lesser  intensity of care such as a SNF. 4. Patient has experienced substantial functional loss from his/her baseline which was documented above  under the "Functional History" and "Functional Status" headings. Judging by the patient's diagnosis, physical exam, and functional history, the patient has potential for functional progress which will result in measurable gains while on inpatient rehab. These gains will be of substantial and practical use upon discharge in facilitating mobility and self-care at the household level. 5. Physiatrist will provide 24 hour management of medical needs as well as oversight of the therapy plan/treatment and provide guidance as appropriate regarding the interaction of the two. 6. 24 hour rehab nursing will assist with bladder management, bowel management, safety, skin/wound care, disease management, medication administration, pain management and patient education and help integrate therapy concepts, techniques,education, etc. 7. PT will assess and treat for/with: pre gait, gait training, endurance , safety, equipment, neuromuscular re education. Goals are: Supervision. 8. OT will assess and treat for/with: ADLs, Cognitive perceptual skills, Neuromuscular re education, safety, endurance, equipment. Goals are: Supervision/ModI. Therapy may proceed with showering this patient. 9. SLP will assess and treat for/with: Memory, attention, concentration, problem solving, medication management. Goals are: Supervision medication management. 10. Case Management and Social Worker will assess and treat for psychological issues and discharge planning. 11. Team conference will be held weekly to assess progress toward goals and to determine barriers to discharge. 12. Patient will receive at least 3 hours of therapy per day at least 5 days per week. 13. ELOS: 10-14d  14. Prognosis: good     Charlett Blake M.D. [facility] FAAPM&R (Sports Med, Neuromuscular  Med) Diplomate Am Board of Electrodiagnostic Med  02/08/2015

## 2015-02-08 NOTE — Progress Notes (Addendum)
[address]       [employer] [address]             [phone]      15 Days Post-Op Procedure(s) (LRB): TRANSESOPHAGEAL ECHOCARDIOGRAM (TEE) (N/A) Re-Operation, MINIMALLY INVASIVE MITRAL VALVE (MV) REPLACEMENT. (Right) Subjective: Feels ok, stools are loose  Objective: Vital signs in last 24 hours: Temp:  [98.1 F (36.7 C)-98.6 F (37 C)] 98.3 F (36.8 C) (06/09 0447) Pulse Rate:  [76-111] 76 (06/09 0447) Cardiac Rhythm:  [-] Atrial fibrillation (06/08 2024) Resp:  [15-31] 20 (06/09 0447) BP: (93-151)/(63-109) 110/69 mmHg (06/09 0447) SpO2:  [90 %-98 %] 91 % (06/09 0447) Weight:  [231 lb 1.6 oz (104.826 kg)] 231 lb 1.6 oz (104.826 kg) (06/09 0447)  Hemodynamic parameters for last 24 hours:    Intake/Output from previous day: 06/08 0701 - 06/09 0700 In: 260 [P.O.:240; I.V.:20] Out: 200 [Urine:200] Intake/Output this shift:    General appearance: alert, cooperative and no distress Heart: irregularly irregular rhythm, ejection click present and soft systolic murmur Lungs: dim in bases Abdomen: soft non-tender Extremities: venous stasis dermatitis noted Wound: incis healing well  Lab Results:  Recent Labs  02/07/15 0300  WBC 10.7*  HGB 10.3*  HCT 34.2*  PLT 200   BMET:  Recent Labs  02/07/15 0300 02/08/15 0523  NA 138 136  K 4.0 3.9  CL 97* 96*  CO2 34* 32  GLUCOSE 85 107*  BUN 15 14  CREATININE 0.78 0.82  CALCIUM 8.0* 8.1*    PT/INR:  Recent Labs  02/08/15 0523  LABPROT 30.5*  INR 2.98*   ABG    Component Value Date/Time   PHART 7.224* 01/30/2015 1521   HCO3 30.7* 01/30/2015 1521   TCO2 33 01/30/2015 1521   ACIDBASEDEF 1.0 01/26/2015 0425   O2SAT 70.1 01/31/2015 0420   CBG (last 3)   Recent Labs  02/07/15 1655 02/07/15 2122 02/08/15 0544  GLUCAP 115* 84 112*    Meds Scheduled Meds: . allopurinol  300 mg Oral Daily  . antiseptic oral rinse  7 mL Mouth Rinse BID  . aspirin EC  81 mg Oral Daily  .  atenolol  50 mg Oral BID  . busPIRone  7.5 mg Oral BID  . collagenase   Topical Daily  . digoxin  0.25 mg Oral Daily  . furosemide  40 mg Oral BID  . insulin aspart  0-20 Units Subcutaneous TID WC  . metroNIDAZOLE  500 mg Oral Q8H  . potassium chloride  20 mEq Oral BID  . risperiDONE  0.5 mg Oral BID  . sodium chloride  10-40 mL Intracatheter Q12H  . sodium chloride  3 mL Intravenous Q12H  . warfarin  7.5 mg Oral q1800  . Warfarin - Physician Dosing Inpatient   Does not apply q1800   Continuous Infusions:  PRN Meds:.sodium chloride, fentaNYL (SUBLIMAZE) injection, Gerhardt's butt cream, levalbuterol, metoprolol, ondansetron (ZOFRAN) IV, sodium chloride, sodium chloride, traZODone, white petrolatum  Xrays Dg Chest 2 View  02/08/2015   CLINICAL DATA:  CHF, mitral valve replacement  EXAM: CHEST  2 VIEW  COMPARISON:  Portable chest x-ray of February 07, 2015  FINDINGS: The lungs are better inflated today although there is persistent mild hypo inflation. There is no visible right-sided pneumothorax. The pulmonary interstitial markings remain increased greater in the right mid and lower lung than elsewhere. The cardiac silhouette remains enlarged. The central pulmonary vascularity is mildly engorged. There is a small right pleural  effusion. There are 7 intact sternal wires. The mitral valve ring is visible. The right-sided PICC line tip projects over the midportion of the SVC. The bony thorax exhibits no acute abnormality.  IMPRESSION: Slight improved aeration of both lungs. Persistent mild interstitial edema bilaterally secondary to CHF. Subsegmental atelectasis in the right mid and lower lung with small effusion is present and stable.   Electronically Signed   By: David  Martinique M.D.   On: 02/08/2015 07:40   Dg Chest Port 1 View  02/07/2015   CLINICAL DATA:  Respiratory failure.  EXAM: PORTABLE CHEST - 1 VIEW  COMPARISON:  02/05/2015.  FINDINGS: Interim removal of right chest tubes. Right PICC line in  stable position. Tiny right pneumothorax. No tension. Prior median sternotomy and cardiac valve replacement. Stable cardiomegaly. Stable mild bilateral pulmonary interstitial prominence. Tiny right pleural effusion cannot be excluded.  IMPRESSION: 1. Interim removal of right chest tubes. Tiny right pneumothorax. No tension. Right PICC line in stable position. 2. Prior cardiac valve replacement.  Stable severe cardiomegaly. 3. Stable mild bilateral pulmonary interstitial prominence. Mild interstitial edema cannot be excluded. Critical Value/emergent results were called by telephone at the time of interpretation on 02/07/2015 at 8:03 am to nurse [facility] verbally acknowledged these results.   Electronically Signed   By: Marcello Moores  Register   On: 02/07/2015 08:05    Assessment/Plan: S/P Procedure(s) (LRB): TRANSESOPHAGEAL ECHOCARDIOGRAM (TEE) (N/A) Re-Operation, MINIMALLY INVASIVE MITRAL VALVE (MV) REPLACEMENT. (Right)  1 doing well 2 afib is stable 3 labs stable 4 sugars well controlled 5 awaits decision for SNF v ST SNF 6 CXR aeration improving- cont diuresis 7 will decrease coumadin dose  LOS: 15 days    GOLD,WAYNE E 02/08/2015  I have seen and examined the patient and agree with the assessment and plan as outlined.  D/C to inpatient rehab today.  Rexene Alberts 02/08/2015 1:34 PM

## 2015-02-08 NOTE — Progress Notes (Signed)
PULMONARY / CRITICAL CARE MEDICINE   Name: Lance Cook MRN: [id number] DOB: [birth year]    ADMISSION DATE:  01/24/2015 CONSULTATION DATE:  02/08/2015  REFERRING MD :  Roxy Manns  CHIEF COMPLAINT:  Hypoxemia post op  INITIAL PRESENTATION: 54 y.o. M with extensive cardiac hx including severe MR.  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 Roxy Manns via rt thoracotomy.  Following procedure, remained hypoxemic and had edema vs ARDS on CXR.  PCCM consulted for recs. Long pump time. 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 QTc yesterday 442msec. Sitting in chair. D/w Roxy Manns and bedside RN - who feels patient has significantly improved and reports patient slept well last night  02/06/15: hx from RN: cntinues to get better. Walked with PT. No diarrhea. Chest tubes out. Possible dc to LTAC. PAtient says his confusion is better and he is feeling better   SUBJECTIVE/OVERNIGHT/INTERVAL HX 02/08/2015: Wife at bedside:  Says he is 100% better but not at yet at 100%. Feels delirium is 98% improved. She reports patient being on lot of CNS meds - iopioids, benzo, muscul relxant and all this needs to restarted. RN does not report overnight issuees. Possible for SNF rehab + Last EKG 02/06/15 wih normal QTc  VITAL SIGNS: Temp:  [98.1 F (36.7 C)-98.5 F (36.9 C)] 98.3 F (36.8 C) (06/09 0447) Pulse Rate:  [76-102] 76 (06/09 0447) Resp:  [17-31] 20 (06/09 0447) BP: (93-151)/(64-109) 110/69 mmHg (06/09 0447) SpO2:  [90 %-98 %] 91 % (06/09 0447) Weight:  [104.826 kg (231 lb 1.6 oz)] 104.826 kg (231 lb 1.6 oz) (06/09 0447) HEMODYNAMICS:   VENTILATOR SETTINGS:   INTAKE / OUTPUT: Intake/Output      06/08 0701 - 06/09 0700 06/09 0701 - 06/10 0700   P.O. 240    I.V. (mL/kg) 20 (0.2)    IV Piggyback     Total Intake(mL/kg) 260 (2.5)    Urine (mL/kg/hr) 200 (0.1)    Stool 0  (0)    Total Output 200     Net +60          Urine Occurrence 2 x    Stool Occurrence 3 x      PHYSICAL EXAMINATION: General: awake in chair. WAtching TV. Wife at bedside HENT: NCAT OP clear PULM: CTA B today CV: Irreg irreg, mech S1 GI: soft, nontender Derm: edema extremities noted with chronic discoloration Neuro: Awake, alert, Calm. Oriented x 3. CAM-ICU test - negative. Moved all 4s  LABS:   PULMONARY No results for input(s): PHART, PCO2ART, PO2ART, HCO3, TCO2, O2SAT in the last 168 hours.  Invalid input(s): PCO2, PO2  CBC  Recent Labs Lab 02/03/15 0450 02/05/15 0521 02/07/15 0300  HGB 11.5* 10.7* 10.3*  HCT 39.6 36.6* 34.2*  WBC 11.4* 12.2* 10.7*  PLT 224 211 200    COAGULATION  Recent Labs Lab 02/04/15 0450 02/05/15 0521 02/06/15 0410 02/07/15 0300 02/08/15 0523  INR 2.07* 1.93* 2.23* 2.47* 2.98*    CARDIAC  No results for input(s): TROPONINI in the last 168 hours. No results for input(s): PROBNP in the last 168 hours.   CHEMISTRY  Recent Labs Lab 02/04/15 1033 02/05/15 0521 02/06/15 0410 02/07/15 0300 02/08/15 0523  NA 139 143 139 138 136  K 3.2* 3.3* 3.5 4.0 3.9  CL 93* 96* 96* 97* 96*  CO2 38* 39* 35* 34* 32  GLUCOSE  144* 75 89 85 107*  BUN 28* 24* 21* 15 14  CREATININE 0.91 0.82 0.78 0.78 0.82  CALCIUM 8.0* 7.8* 7.5* 8.0* 8.1*  MG  --   --  1.8 1.8 1.8  PHOS  --   --  2.4* 2.4* 2.9   Estimated Creatinine Clearance: 108 mL/min (by C-G formula based on Cr of 0.82).   LIVER  Recent Labs Lab 02/04/15 0450 02/05/15 0521 02/06/15 0410 02/07/15 0300 02/08/15 0523  AST  --  20  --   --   --   ALT  --  14*  --   --   --   ALKPHOS  --  37*  --   --   --   BILITOT  --  0.8  --   --   --   PROT  --  4.7*  --   --   --   ALBUMIN  --  2.0*  --   --   --   INR 2.07* 1.93* 2.23* 2.47* 2.98*     INFECTIOUS No results for input(s): LATICACIDVEN, PROCALCITON in the last 168 hours.   ENDOCRINE CBG (last 3)   Recent Labs   02/07/15 1655 02/07/15 2122 02/08/15 0544  GLUCAP 115* 84 112*         IMAGING x48h  - image(s) personally visualized  -   highlighted in bold Dg Chest 2 View  02/08/2015   CLINICAL DATA:  CHF, mitral valve replacement  EXAM: CHEST  2 VIEW  COMPARISON:  Portable chest x-ray of February 07, 2015  FINDINGS: The lungs are better inflated today although there is persistent mild hypo inflation. There is no visible right-sided pneumothorax. The pulmonary interstitial markings remain increased greater in the right mid and lower lung than elsewhere. The cardiac silhouette remains enlarged. The central pulmonary vascularity is mildly engorged. There is a small right pleural effusion. There are 7 intact sternal wires. The mitral valve ring is visible. The right-sided PICC line tip projects over the midportion of the SVC. The bony thorax exhibits no acute abnormality.  IMPRESSION: Slight improved aeration of both lungs. Persistent mild interstitial edema bilaterally secondary to CHF. Subsegmental atelectasis in the right mid and lower lung with small effusion is present and stable.   Electronically Signed   By: David  Martinique M.D.   On: 02/08/2015 07:40   Dg Chest Port 1 View  02/07/2015   CLINICAL DATA:  Respiratory failure.  EXAM: PORTABLE CHEST - 1 VIEW  COMPARISON:  02/05/2015.  FINDINGS: Interim removal of right chest tubes. Right PICC line in stable position. Tiny right pneumothorax. No tension. Prior median sternotomy and cardiac valve replacement. Stable cardiomegaly. Stable mild bilateral pulmonary interstitial prominence. Tiny right pleural effusion cannot be excluded.  IMPRESSION: 1. Interim removal of right chest tubes. Tiny right pneumothorax. No tension. Right PICC line in stable position. 2. Prior cardiac valve replacement.  Stable severe cardiomegaly. 3. Stable mild bilateral pulmonary interstitial prominence. Mild interstitial edema cannot be excluded. Critical Value/emergent results were called by  telephone at the time of interpretation on 02/07/2015 at 8:03 am to [facility] verbally acknowledged these results.   Electronically Signed   By: Marcello Moores  Register   On: 02/07/2015 08:05          ASSESSMENT / PLAN:  PULMONARY ETT 5/25 >> 6/03 A:  Acute hypoxic resp failure > off o2 as of 02/08/15 Pulm infiltrates (edema at this point) - improving slowly as of 02/08/15 OSA  - no  acute issues. OFf o2 as of    P:   CPAP via home machine/mask  Supp O2 to maintain SpO2 > 92 % Airway hygiene - encourage cough Dc  nebulized steroid    CARDIOVASCULAR A:  Cardiogenic shock, resolved Severe MR - s/p minimally invasive MVR 01/24/15 H/o CAD, HTN, HLD Chronic dCHF Hx PAF  - 3rd spacing + - improving as o 02/08/15  P:  Hemodynamic mgmt per TCTS  RENAL A:   AKI with hypernatremia , resolved  Active issue  - Anasarca improving - mild hypomag (< 2gm%)  P:   Replete K, - goal > 4; done by ? eMD Replete mag 02/08/15  Monitor I/Os Lasix po per CVTS   Allow free water intake po  GASTROINTESTINAL A:   GERD Post extubation dysphagia    02/08/15 - appears to be eating diet P:   Diet per speech   HEMATOLOGIC A:   ICU acquired anemia > mild + - unchanged as 02/08/15 P:  DVT px: warfarin (mech valve) Monitor CBC intermittently Transfuse per usual ICU guidelines  - PRBC for hgb <7gm%  INFECTIOUS A:   C diff colitis 01/30/15 - on active Rx    - repeat PCR 01/31/15 - negative (likely false negative)  - diarrhea improved per RN 02/06/15 -s/p flexiseal ending 02/05/15   P:   Aztreonam start date 5/27 >> 6/02 Vancomycin, start date 5/25 >> 6/03 Metronidazole 5/31 >>  (Complete 14 days rx for uncomplicated C diff) C diff precautions emphasized to family and patient  ENDOCRINE A:   DM2, controlled   - 02/08/15 - RN reports no acute issues  P:   Cont SSI DM diet  NEUROLOGIC A:   ICU delirium with hallucinations and insomnia - > worse 6/5 and started on risperdal   -   Nearly resolved 02/06/15.  - further improved 02/08/15. Wife reports chhronic intake of temazepan for insomnia, buspar, robaxin, oxycodone. CLear these or lack of some of these contritubed to delirium.  P: - reduce inpatient new risperdal - slowly taper off as clinically indicated   -- continue home buspar - change inpatient trazadone to home temazepam 7.45m g po qhs - hold home robaxin and oxycodone - does not seem to have a need  - but restart as indicated -  RASS goal: 0 Cont current meds Check QTc prn Trazodone qHS prn sleep   GLOBAL PCCM sign off.   D/w wife and RN about above care plan    Dr. Brand Males, M.D., [facility].C.P Pulmonary and Critical Care Medicine Staff Physician [facility] Pager: [phone], If no answer or between  15:00h - 7:00h: call [phone]  02/08/2015 9:14 AM

## 2015-02-08 NOTE — Progress Notes (Signed)
Chest tube sutures X3 removed. No drainage at site. Right groin cleansed with soap and water and santyl applied.

## 2015-02-09 ENCOUNTER — Inpatient Hospital Stay (HOSPITAL_COMMUNITY): Payer: Medicare HMO | Admitting: Speech Pathology

## 2015-02-09 ENCOUNTER — Inpatient Hospital Stay (HOSPITAL_COMMUNITY): Payer: Medicare HMO

## 2015-02-09 ENCOUNTER — Inpatient Hospital Stay (HOSPITAL_COMMUNITY): Payer: Medicare HMO | Admitting: Occupational Therapy

## 2015-02-09 DIAGNOSIS — F4322 Adjustment disorder with anxiety: Secondary | ICD-10-CM

## 2015-02-09 DIAGNOSIS — R5381 Other malaise: Principal | ICD-10-CM

## 2015-02-09 DIAGNOSIS — I5032 Chronic diastolic (congestive) heart failure: Secondary | ICD-10-CM

## 2015-02-09 DIAGNOSIS — I10 Essential (primary) hypertension: Secondary | ICD-10-CM

## 2015-02-09 DIAGNOSIS — I480 Paroxysmal atrial fibrillation: Secondary | ICD-10-CM

## 2015-02-09 DIAGNOSIS — D62 Acute posthemorrhagic anemia: Secondary | ICD-10-CM

## 2015-02-09 LAB — URINALYSIS, ROUTINE W REFLEX MICROSCOPIC
Bilirubin Urine: NEGATIVE
Glucose, UA: NEGATIVE mg/dL
KETONES UR: NEGATIVE mg/dL
Leukocytes, UA: NEGATIVE
Nitrite: NEGATIVE
PH: 6.5 (ref 5.0–8.0)
PROTEIN: NEGATIVE mg/dL
Specific Gravity, Urine: 1.006 (ref 1.005–1.030)
Urobilinogen, UA: 0.2 mg/dL (ref 0.0–1.0)

## 2015-02-09 LAB — CBC WITH DIFFERENTIAL/PLATELET
BASOS ABS: 0.1 10*3/uL (ref 0.0–0.1)
BASOS PCT: 1 % (ref 0–1)
EOS PCT: 2 % (ref 0–5)
Eosinophils Absolute: 0.2 10*3/uL (ref 0.0–0.7)
HCT: 36 % — ABNORMAL LOW (ref 39.0–52.0)
Hemoglobin: 10.7 g/dL — ABNORMAL LOW (ref 13.0–17.0)
Lymphocytes Relative: 6 % — ABNORMAL LOW (ref 12–46)
Lymphs Abs: 0.5 10*3/uL — ABNORMAL LOW (ref 0.7–4.0)
MCH: 25.6 pg — AB (ref 26.0–34.0)
MCHC: 29.7 g/dL — ABNORMAL LOW (ref 30.0–36.0)
MCV: 86.1 fL (ref 78.0–100.0)
MONO ABS: 0.8 10*3/uL (ref 0.1–1.0)
Monocytes Relative: 9 % (ref 3–12)
NEUTROS PCT: 82 % — AB (ref 43–77)
Neutro Abs: 8 10*3/uL — ABNORMAL HIGH (ref 1.7–7.7)
Platelets: 270 10*3/uL (ref 150–400)
RBC: 4.18 MIL/uL — AB (ref 4.22–5.81)
RDW: 19 % — ABNORMAL HIGH (ref 11.5–15.5)
WBC: 9.6 10*3/uL (ref 4.0–10.5)

## 2015-02-09 LAB — GLUCOSE, CAPILLARY
GLUCOSE-CAPILLARY: 84 mg/dL (ref 65–99)
Glucose-Capillary: 126 mg/dL — ABNORMAL HIGH (ref 65–99)
Glucose-Capillary: 105 mg/dL — ABNORMAL HIGH (ref 65–99)
Glucose-Capillary: 62 mg/dL — ABNORMAL LOW (ref 65–99)
Glucose-Capillary: 98 mg/dL (ref 65–99)

## 2015-02-09 LAB — COMPREHENSIVE METABOLIC PANEL
ALBUMIN: 2.3 g/dL — AB (ref 3.5–5.0)
ALT: 16 U/L — AB (ref 17–63)
AST: 21 U/L (ref 15–41)
Alkaline Phosphatase: 40 U/L (ref 38–126)
Anion gap: 6 (ref 5–15)
BILIRUBIN TOTAL: 0.6 mg/dL (ref 0.3–1.2)
BUN: 15 mg/dL (ref 6–20)
CHLORIDE: 100 mmol/L — AB (ref 101–111)
CO2: 32 mmol/L (ref 22–32)
CREATININE: 0.79 mg/dL (ref 0.61–1.24)
Calcium: 8.3 mg/dL — ABNORMAL LOW (ref 8.9–10.3)
GFR calc Af Amer: >60 mL/min (ref >60)
GFR calc non Af Amer: >60 mL/min (ref >60)
Glucose, Bld: 94 mg/dL (ref 65–99)
POTASSIUM: 3.6 mmol/L (ref 3.5–5.1)
SODIUM: 138 mmol/L (ref 135–145)
Total Protein: 5.1 g/dL — ABNORMAL LOW (ref 6.5–8.1)

## 2015-02-09 LAB — PHOSPHORUS: PHOSPHORUS: 3.5 mg/dL (ref 2.5–4.6)

## 2015-02-09 LAB — PROTIME-INR
INR: 3.25 — ABNORMAL HIGH (ref 0.00–1.49)
PROTHROMBIN TIME: 32.5 s — AB (ref 11.6–15.2)

## 2015-02-09 LAB — URINE MICROSCOPIC-ADD ON

## 2015-02-09 LAB — MAGNESIUM: Magnesium: 1.9 mg/dL (ref 1.7–2.4)

## 2015-02-09 MED ORDER — WARFARIN SODIUM 2.5 MG PO TABS
2.5000 mg | ORAL_TABLET | Freq: Once | ORAL | Status: AC
  Administered 2015-02-09: 2.5 mg via ORAL
  Filled 2015-02-09 (×2): qty 1

## 2015-02-09 MED ORDER — SODIUM CHLORIDE 0.9 % IJ SOLN
10.0000 mL | INTRAMUSCULAR | Status: DC | PRN
  Administered 2015-02-09: 10 mL
  Administered 2015-02-09: 40 mL
  Administered 2015-02-09 – 2015-02-14 (×4): 10 mL
  Filled 2015-02-09 (×6): qty 40

## 2015-02-09 MED ORDER — SODIUM CHLORIDE 0.9 % IJ SOLN
10.0000 mL | Freq: Two times a day (BID) | INTRAMUSCULAR | Status: DC
  Administered 2015-02-09 – 2015-02-10 (×2): 10 mL
  Administered 2015-02-13: 30 mL
  Administered 2015-02-15: 20 mL

## 2015-02-09 MED ORDER — WARFARIN - PHARMACIST DOSING INPATIENT
Freq: Every day | Status: DC
  Administered 2015-02-11: 18:00:00

## 2015-02-09 NOTE — Progress Notes (Signed)
Patient information reviewed and entered into eRehab system by Patrizia Paule, RN, CRRN, PPS Coordinator.  Information including medical coding and functional independence measure will be reviewed and updated through discharge.     Per nursing patient was given "Data Collection Information Summary for Patients in Inpatient Rehabilitation Facilities with attached "Privacy Act Statement-Health Care Records" upon admission.  

## 2015-02-09 NOTE — Evaluation (Signed)
Speech Language Pathology Assessment and Plan  Patient Details  Name: Lance Cook MRN: [id number] Date of Birth: 01/26/1961  SLP Diagnosis: Cognitive Impairments  Rehab Potential: Excellent ELOS: TBD    Today's Date: 02/09/2015 SLP Individual Time: 0800-0900 SLP Individual Time Calculation (min): 60 min   Problem List:  Patient Active Problem List   Diagnosis Date Noted  . Hypomagnesemia 02/08/2015  . Physical deconditioning 02/08/2015  . Encephalopathy acute 02/05/2015  . Hypokalemia 02/05/2015  . Enteritis due to Clostridium difficile 01/30/2015  . HCAP (healthcare-associated pneumonia) 01/29/2015  . Acute respiratory failure with hypoxemia   . ARDS (adult respiratory distress syndrome) 01/27/2015  . 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 79/89/2119  . Chronic diastolic congestive heart failure   . Atelectasis   . History of mechanical aortic valve replacement 12/25/2014  . 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 Severe mitral regurgitation 10/11/2014  . Cellulitis 04/02/2014  . Hypotension 04/02/2014  . Venous insufficiency of both lower extremities 04/02/2014  . Morbid obesity 04/02/2014  . Encounter for therapeutic drug monitoring 02/02/2014  . Mass of left side of neck 08/18/2012  . Head or neck swelling, mass, or lump 07/15/2012  . Long term (current) use of anticoagulants 07/15/2012  . Hypogonadism male 04/08/2012  . History of adenomatous polyp of colon 12/23/2011  . Testicular cancer   . TIA (transient ischemic attack)   . OSA (obstructive sleep apnea)   . Lumbar spinal stenosis 10/08/2011  . Chronic LBP 10/08/2011  . CVA (cerebral vascular accident) 10/08/2011  . Erectile dysfunction 10/08/2011  . Preventative health care 08/09/2011  . CAD (coronary artery disease)   . Varicose veins 05/01/2011  . SPINAL STENOSIS, LUMBAR 10/01/2010  . ULCER, LEG 04/29/2010  . Osteoarthritis of spine  04/29/2010  . DYSLIPIDEMIA 01/15/2010  . Chronic venous hypertension with ulcer 01/15/2010  . Essential hypertension 12/14/2009  . MUSCLE CRAMPS 12/14/2009  . LEG PAIN, LEFT 11/29/2009  . DIABETES MELLITUS, TYPE II 11/01/2009  . GOUT 11/01/2009  . Iron deficiency anemia 11/01/2009  . MYOCARDIAL INFARCTION, HX OF 11/01/2009  . ATRIAL FIBRILLATION, CHRONIC 11/01/2009  . INTERNAL HEMORRHOIDS 11/01/2009  . PERIPHERAL EDEMA 11/01/2009  . H/O mechanical aortic valve replacement 11/01/2009  . Ascending aortic dissection 07/14/2008   Past Medical History:  Past Medical History  Diagnosis Date  . Hyperlipidemia   . Hypertension   . Arthritis   . Leg pain 06/28/2010  . Hiatal hernia   . Atrial fibrillation     chronic persistent  . Reflux 11/06/09  . Myocardial infarction age 46  . Varicose veins   . Obesity   . Peripheral vascular disease   . Gout   . CAD (coronary artery disease)     Old scar inferior wall myoview, 10/2009 EF 52%  . Chronic LBP 10/08/2011  . Impaired glucose tolerance 10/08/2011  . Erectile dysfunction 10/08/2011  . Testicular cancer   . Bell's palsy   . TIA (transient ischemic attack)     age 48  . CVA (cerebral infarction)     54yo  . OSA (obstructive sleep apnea)     CPAP  . History of colon polyps   . Anemia   . Hypogonadism male 04/08/2012  . Aortic aneurysm   . Aortic aneurysm and dissection   . 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 Ascending aortic dissection 07/14/2008    Localized dissection of ascending aorta noted on CTA in 2009 and stable on CTA in 2011  . Severe mitral regurgitation 10/11/2014  . Morbid obesity 04/02/2014  . Diabetes mellitus     diet controlled  . DIABETES MELLITUS, TYPE II 11/01/2009    Qualifier: Diagnosis of  By: [name] MD, [name]    . Dysrhythmia   . CHF (congestive heart failure)   . Shortness of breath dyspnea      with exertion  . Anxiety   . GERD (gastroesophageal reflux disease)     not needing medication at thhis time- 01/22/15  . Stroke     TIA and Stroke no residual effect- 1st was 32  . Chronic diastolic congestive heart failure   . 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 7/94/8016    33 mm St Jude bileaflet mechanical valve placed via right mini thoracotomy approach   Past Surgical History:  Past Surgical History  Procedure Laterality Date  . Laser ablation  03/06/2010    left leg  . Cardiac valve replacement      [id number]  . Cholecystectomy  2011  . Tonsillectomy  1967  . Otoplasty      bilateral, age 54  . Orchiectomy  age 77    testicular cancer  . Tee without cardioversion N/A 10/11/2014    Procedure: TRANSESOPHAGEAL ECHOCARDIOGRAM (TEE);  Surgeon: Pixie Casino, MD;  Location[facility] ENDOSCOPY;  Service: Cardiovascular;  Laterality: N/ADeneen Harts procedure  1988    25 mm St Jude mechanical valve conduit - Dr Blase Mess at [name] in [place], [place]  . Coronary artery bypass graft    . Aortic truck    . Tee without cardioversion N/A 01/24/2015    Procedure: TRANSESOPHAGEAL ECHOCARDIOGRAM (TEE);  Surgeon: Rexene Alberts, MD;  Location: [facility];  Service: Open Heart Surgery;  Laterality: N/A;  . Mitral valve replacement Right 01/24/2015    Procedure: Re-Operation, MINIMALLY INVASIVE MITRAL VALVE (MV) REPLACEMENT.;  Surgeon: Rexene Alberts, MD;  Location: [facility];  Service: Open Heart Surgery;  Laterality: Right;    Assessment / Plan / Recommendation Clinical Impression Lance Cook is a 54 y.o. male with history of CAF, PVD, TIA's, CAD s/p CAD with AVR '99, severe MR who was admitted for minimally invasive MVR on 01/24/15 by Dr. Roxy Manns. Post op with hypoxemia due to edema v/s ARDS and required prolonged vent support. He was treated with aggressive diuresis as well as IV antibiotics for possible HCAP.  He has had issues with mucous plugging as well as c  diff colitis and was started on metronidazole on 05/31. He tolerated extubation with prn BIPAP use on 06/03. He has had worsening of ICU delirium with confusion and hallucinations 6/05 and Risperdal was added with improvement in mentation. Fluid overload is improving with diuresis and he's is being weaned off oxygen. Rectal tube d/c as diarrhea has resolved.  A fib is rate controlled.  Patient noted to be severely deconditioned and CIR was recommended by MD and Rehab team. Patient was admitted to [facility] on 02/08/15. Patient presents with mild cognitive impairments characterized by decreased selective attention, recall of new information, emergent awareness, problem solving and safety which impacts his ability to complete functional and familiar tasks safely. Patient would benefit from skilled SLP intervention to maximize his cognitive function and overall functional independence prior to discharge.   Skilled Therapeutic Interventions  Administered a cognitive-linguistic evaluation. Please see above for details.  Administered the MoCA and patient scored a 23/30 with a score of 26 or above considered normal. Patient and his wife were educated on patient's current cognitive function and goals of skilled SLP intervention, both verbalized understanding.   SLP Assessment  Patient will need skilled [facility] during CIR admission    Recommendations  Oral Care Recommendations: Oral care BID Recommendations for Other Services: Neuropsych consult Patient destination: Home Follow up Recommendations: 24 hour supervision/assistance;Outpatient SLP    SLP Frequency 3 to 5 out of 7 days   SLP Treatment/Interventions Cognitive remediation/compensation;Cueing hierarchy;Functional tasks;Internal/external aids;Environmental controls;Patient/family education;Therapeutic Activities    Pain Pain Assessment Pain Assessment: No/denies pain  Prior Functioning Type of Home: Mobile home  Lives  With: Spouse Available Help at Discharge: Family;Available 24 hours/day Vocation: On disability  Short Term Goals: Week 1: SLP Short Term Goal 1 (Week 1): Patient will demonstrate selective attention to a task in a minimally disctracting enviornment for 45 minutes with Supervision verbal cues for redireciton.  SLP Short Term Goal 2 (Week 1): Patient will demonstrate functional problem solving for basic and familiar tasks with supervision verbal cues.  SLP Short Term Goal 3 (Week 1): Patient will utilize external aids to recall new, daily information with supervision verbal cues.  SLP Short Term Goal 4 (Week 1): Patient will self-monitor and correct errors during functional tasks with supervision question and verbal cues.   See FIM for current functional status Refer to Care Plan for Long Term Goals  Recommendations for other services: Neuropsych  Discharge Criteria: Patient will be discharged from SLP if patient refuses treatment 3 consecutive times without medical reason, if treatment goals not met, if there is a change in medical status, if patient makes no progress towards goals or if patient is discharged from hospital.  The above assessment, treatment plan, treatment alternatives and goals were discussed and mutually agreed upon: by patient and by family  Freyja Govea 02/09/2015, 12:42 PM

## 2015-02-09 NOTE — Evaluation (Signed)
Occupational Therapy Assessment and Plan  Patient Details  Name: Lance Cook MRN: [id number] Date of Birth: July 08, 1961  OT Diagnosis: cognitive deficits and muscle weakness (generalized) Rehab Potential: Rehab Potential (ACUTE ONLY): Good ELOS: 5-7 days   Today's Date: 02/09/2015 OT Individual Time: 1100-1200 OT Individual Time Calculation (min): 60 min     Problem List:  Patient Active Problem List   Diagnosis Date Noted  . Hypomagnesemia 02/08/2015  . Physical deconditioning 02/08/2015  . Encephalopathy acute 02/05/2015  . Hypokalemia 02/05/2015  . Enteritis due to Clostridium difficile 01/30/2015  . HCAP (healthcare-associated pneumonia) 01/29/2015  . Acute respiratory failure with hypoxemia   . ARDS (adult respiratory distress syndrome) 01/27/2015  . 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 47/42/5956  . Chronic diastolic congestive heart failure   . Atelectasis   . History of mechanical aortic valve replacement 12/25/2014  . 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 Severe mitral regurgitation 10/11/2014  . Cellulitis 04/02/2014  . Hypotension 04/02/2014  . Venous insufficiency of both lower extremities 04/02/2014  . Morbid obesity 04/02/2014  . Encounter for therapeutic drug monitoring 02/02/2014  . Mass of left side of neck 08/18/2012  . Head or neck swelling, mass, or lump 07/15/2012  . Long term (current) use of anticoagulants 07/15/2012  . Hypogonadism male 04/08/2012  . History of adenomatous polyp of colon 12/23/2011  . Testicular cancer   . TIA (transient ischemic attack)   . OSA (obstructive sleep apnea)   . Lumbar spinal stenosis 10/08/2011  . Chronic LBP 10/08/2011  . CVA (cerebral vascular accident) 10/08/2011  . Erectile dysfunction 10/08/2011  . Preventative health care 08/09/2011  . CAD (coronary artery disease)   . Varicose veins 05/01/2011  . SPINAL STENOSIS, LUMBAR 10/01/2010  .  ULCER, LEG 04/29/2010  . Osteoarthritis of spine 04/29/2010  . DYSLIPIDEMIA 01/15/2010  . Chronic venous hypertension with ulcer 01/15/2010  . Essential hypertension 12/14/2009  . MUSCLE CRAMPS 12/14/2009  . LEG PAIN, LEFT 11/29/2009  . DIABETES MELLITUS, TYPE II 11/01/2009  . GOUT 11/01/2009  . Iron deficiency anemia 11/01/2009  . MYOCARDIAL INFARCTION, HX OF 11/01/2009  . ATRIAL FIBRILLATION, CHRONIC 11/01/2009  . INTERNAL HEMORRHOIDS 11/01/2009  . PERIPHERAL EDEMA 11/01/2009  . H/O mechanical aortic valve replacement 11/01/2009  . Ascending aortic dissection 07/14/2008    Past Medical History:  Past Medical History  Diagnosis Date  . Hyperlipidemia   . Hypertension   . Arthritis   . Leg pain 06/28/2010  . Hiatal hernia   . Atrial fibrillation     chronic persistent  . Reflux 11/06/09  . Myocardial infarction age 60  . Varicose veins   . Obesity   . Peripheral vascular disease   . Gout   . CAD (coronary artery disease)     Old scar inferior wall myoview, 10/2009 EF 52%  . Chronic LBP 10/08/2011  . Impaired glucose tolerance 10/08/2011  . Erectile dysfunction 10/08/2011  . Testicular cancer   . Bell's palsy   . TIA (transient ischemic attack)     age 89  . CVA (cerebral infarction)     54yo  . OSA (obstructive sleep apnea)     CPAP  . History of colon polyps   . Anemia   . Hypogonadism male 04/08/2012  . Aortic aneurysm   . Aortic aneurysm and dissection   . 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     1988 -  Dr Blase Mess at [facility] in [place], [place]  . Ascending aortic dissection 07/14/2008    Localized dissection of ascending aorta noted on CTA in 2009 and stable on CTA in 2011  . Severe mitral regurgitation 10/11/2014  . Morbid obesity 04/02/2014  . Diabetes mellitus     diet controlled  . DIABETES MELLITUS, TYPE II 11/01/2009    Qualifier: Diagnosis of  By: [name] MD, [name]    . Dysrhythmia   . CHF (congestive  heart failure)   . Shortness of breath dyspnea     with exertion  . Anxiety   . GERD (gastroesophageal reflux disease)     not needing medication at thhis time- 01/22/15  . Stroke     TIA and Stroke no residual effect- 1st was 24  . Chronic diastolic congestive heart failure   . 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 1/57/2620    33 mm St Jude bileaflet mechanical valve placed via right mini thoracotomy approach   Past Surgical History:  Past Surgical History  Procedure Laterality Date  . Laser ablation  03/06/2010    left leg  . Cardiac valve replacement      [id number]  . Cholecystectomy  2011  . Tonsillectomy  1967  . Otoplasty      bilateral, age 52  . Orchiectomy  age 42    testicular cancer  . Tee without cardioversion N/A 10/11/2014    Procedure: TRANSESOPHAGEAL ECHOCARDIOGRAM (TEE);  Surgeon: Pixie Casino, MD;  Location: [facility] END[facility]COPY;  Service: Cardiovascular;  Laterality: N/ADeneen Harts procedure  1988    25 mm St Jude mechanical valve conduit - Dr Blase Mess at [facility] in [place], [place]  . Coronary artery bypass graft    . Aortic truck    . Tee without cardioversion N/A 01/24/2015    Procedure: TRANSESOPHAGEAL ECHOCARDIOGRAM (TEE);  Surgeon: Rexene Alberts, MD;  Location: [facility];  Service: Open Heart Surgery;  Laterality: N/A;  . Mitral valve replacement Right 01/24/2015    Procedure: Re-Operation, MINIMALLY INVASIVE MITRAL VALVE (MV) REPLACEMENT.;  Surgeon: Rexene Alberts, MD;  Location: [facility];  Service: Open Heart Surgery;  Laterality: Right;    Assessment & Plan Clinical Impression: Patient is a 54 y.o. year old male history of CAF, PVD, TIA's, CAD s/p CAD with AVR '99, severe MR who was admitted for minimally invasive MVR on 01/24/15 by Dr. Roxy Manns. Post op with hypoxemia due to edema v/s ARDS and required prolonged vent support. He was treated with aggressive diuresis as well as IV antibiotics for possible HCAP. He has had  issues with mucous plugging as well as c diff colitis and was started on metronidazole on 05/31. He tolerated extubation with prn BIPAP use on 06/03. He has had worsening of ICU delirium with confusion and hallucinations 6/05 and Risperdal was added with improvement in mentation. Fluid overload is improving with diuresis and he's is being weaned off oxygen. Rectal tube d/c as diarrhea has resolved. A fib is rate controlled. Patient transferred to CIR on 02/08/2015 .    Patient currently requires min with basic self-care skills and min A for basic mobility secondary to muscle weakness and decr wound care, decreased cardiorespiratoy endurance, decreased awareness, decreased problem solving and decreased memory and decreased standing balance and decreased balance strategies.  Prior to hospitalization, patient could complete ADL with modified independent .  Patient will benefit from skilled intervention to decrease level of assist with basic self-care  skills and increase independence with basic self-care skills prior to discharge home with care partner.  Anticipate patient will require intermittent supervision and tbd f/u.Marland Kitchen  OT - End of Session Activity Tolerance: Tolerates 10 - 20 min activity with multiple rests Endurance Deficit: Yes OT Assessment Rehab Potential (ACUTE ONLY): Good OT Patient demonstrates impairments in the following area(s): Balance;Cognition;Edema;Endurance;Pain;Safety;Skin Integrity OT Transfers Functional Problem(s): Toilet;Tub/Shower OT Additional Impairment(s): None OT Plan OT Intensity: Minimum of 1-2 x/day, 45 to 90 minutes OT Frequency: 5 out of 7 days OT Duration/Estimated Length of Stay: 5-7 days OT Treatment/Interventions: Balance/vestibular training;Cognitive remediation/compensation;Community reintegration;Discharge planning;DME/adaptive equipment instruction;Functional mobility training;Pain management;Psychosocial support;Skin care/wound managment;Therapeutic  Activities;UE/LE Strength taining/ROM;UE/LE Coordination activities;Therapeutic Exercise;Splinting/orthotics;Self Care/advanced ADL retraining;Patient/family education OT Basic Self-Care Anticipated Outcome(s): mod I  OT Toileting Anticipated Outcome(s): mod I  OT Bathroom Transfers Anticipated Outcome(s): mod I  OT Recommendation Patient destination: Home Follow Up Recommendations: None Equipment Recommended: To be determined   Skilled Therapeutic Intervention 1:1 Ot eval initiated with pt and pts' wife on Ot's purpose, role, and goals. Self care retraining at shower level with focus on sit to stands, standing tolerance and balance, toilet and shower transfers, activity tolerance/ endurance, functional problem solving, anticipatory awareness, skin care for wounds, awareness and functional mobility with and without RW. Pt can walk short distances without RW but fatigues quickly.   OT Evaluation Precautions/Restrictions  Precautions Precautions: Fall Restrictions Weight Bearing Restrictions: No General Chart Reviewed: Yes Family/Caregiver Present: Yes (wife) Vital Signs Therapy Vitals Temp: 98.9 F (37.2 C) Temp Source: Oral Pulse Rate: 83 Resp: 18 BP: 108/61 mmHg Patient Position (if appropriate): Lying Oxygen Therapy SpO2: 95 % O2 Device: Not Delivered Pain Pain Assessment Pain Assessment: 0-10 Pain Score: 0-No pain Home Living/Prior Functioning Home Living Available Help at Discharge: Family, Available 24 hours/day Type of Home: Mobile home Home Access: Stairs to enter Technical brewer of Steps: 5 Entrance Stairs-Rails: Right Home Layout: One level Additional Comments: Pt reports that wife is 4'9" and can oly do minimal lifting.  She has visual deficits and pt has to help with with visual tasks.  He is the driver in the family.  They have investigated public transportation, but [employer] [place] not service [place] area of the [place]   Lives With: Spouse Prior  Function Level of Independence: Independent with basic ADLs, Independent with gait, Independent with transfers, Independent with homemaking with ambulation  Able to Take Stairs?: Yes Vocation: On disability Comments: wife assisted his wife due to she is legally blind. wife assisted with LB due to his LE edema and chronic back pain ADL ADL ADL Comments: see FIM Vision/Perception  Vision- History Baseline Vision/History: Wears glasses Wears Glasses: At all times Patient Visual Report: No change from baseline  Cognition Overall Cognitive Status: Impaired/Different from baseline Arousal/Alertness: Awake/alert Orientation Level: Person Year: 2016 Month: January Day of Week: Correct Memory: Impaired Attention: Selective Selective Attention: Impaired Selective Attention Impairment: Functional basic;Verbal basic Awareness: Impaired Awareness Impairment: Emergent impairment Problem Solving: Impaired Problem Solving Impairment: Functional basic Safety/Judgment: Impaired Sensation Sensation Light Touch: Appears Intact Stereognosis: Appears Intact Hot/Cold: Appears Intact Proprioception: Appears Intact Coordination Gross Motor Movements are Fluid and Coordinated: No Fine Motor Movements are Fluid and Coordinated: Yes Coordination and Movement Description: slowed pronation/supination Heel Shin Test: difficult due to edema and girth of legs Motor  Motor Motor: Within Functional Limits Mobility  Transfers Transfers: Sit to Stand;Stand to Sit Sit to Stand: 4: Min guard Stand to Sit: 4: Min guard  Trunk/Postural Assessment  Cervical  Assessment Cervical Assessment: Within Functional Limits Thoracic Assessment Thoracic Assessment: Within Functional Limits Lumbar Assessment Lumbar Assessment: Within Functional Limits  Balance Balance Balance Assessed: Yes Dynamic Sitting Balance Sitting balance - Comments: minguard assist jue to slight post lean Static Standing Balance Static  Standing - Level of Assistance: 5: Stand by assistance Dynamic Standing Balance Dynamic Standing - Level of Assistance: 4: Min assist Extremity/Trunk Assessment RUE Assessment RUE Assessment: Within Functional Limits LUE Assessment LUE Assessment: Within Functional Limits  FIM:  FIM - Grooming Grooming Steps: Wash, rinse, dry face;Wash, rinse, dry hands;Brush, comb hair Grooming: 5: Set-up assist to obtain items FIM - Bathing Bathing Steps Patient Completed: Chest;Right Arm;Left Arm;Abdomen;Front perineal area;Right upper leg;Left upper leg Bathing: 3: Mod-Patient completes 5-7 [id number] parts or 50-74% FIM - Upper Body Dressing/Undressing Upper body dressing/undressing steps patient completed: Thread/unthread right sleeve of pullover shirt/dresss;Thread/unthread left sleeve of pullover shirt/dress;Put head through opening of pull over shirt/dress Upper body dressing/undressing: 4: Min-Patient completed 75 plus % of tasks FIM - Lower Body Dressing/Undressing Lower body dressing/undressing steps patient completed: Thread/unthread right underwear leg;Thread/unthread left underwear leg;Pull underwear up/down;Thread/unthread right pants leg;Thread/unthread left pants leg;Pull pants up/down Lower body dressing/undressing: 4: Min-Patient completed 75 plus % of tasks FIM - Toileting Toileting steps completed by patient: Adjust clothing prior to toileting;Performs perineal hygiene;Adjust clothing after toileting Toileting: 4: Steadying assist FIM - TRadio producerDevices: Grab bars Toilet Transfers: 4-To toilet/BSC: Min A (steadying Pt. > 75%);4-From toilet/BSC: Min A (steadying Pt. > 75%) FIM - TSystems developerDevices: Shower chair;Grab bars Tub/shower Transfers: 4-Into Tub/Shower: Min A (steadying Pt. > 75%/lift 1 leg);4-Out of Tub/Shower: Min A (steadying Pt. > 75%/lift 1 leg)   Refer to Care Plan for Long Term Goals  Recommendations for  other services: None  Discharge Criteria: Patient will be discharged from OT if patient refuses treatment 3 consecutive times without medical reason, if treatment goals not met, if there is a change in medical status, if patient makes no progress towards goals or if patient is discharged from hospital.  The above assessment, treatment plan, treatment alternatives and goals were discussed and mutually agreed upon: by patient  SNicoletta Ba6/06/2015, 3:45 PM

## 2015-02-09 NOTE — Progress Notes (Signed)
Pt arrives on unit via wheelchair with wife at his side. Oriented pt and wife to unit. Explained the next day to pt. Offered to answer any question that pt and wife may had. No questions at the time. Carmell Austria [facility]

## 2015-02-09 NOTE — Discharge Instructions (Addendum)
Inpatient Rehab Discharge Instructions  Megan Hayduk Discharge date and time:  02/15/15  Activities/Precautions/ Functional Status: Activity: no lifting, driving, or strenuous exercise till cleared by MD Diet: cardiac diet Limit carb/sweets.  Wound Care: keep wound clean and dry   Functional status:  ___ No restrictions     ___ Walk up steps independently ___ 24/7 supervision/assistance   ___ Walk up steps with assistance _X__ Intermittent supervision/assistance  ___ Bathe/dress independently ___ Walk with walker     ___ Bathe/dress with assistance ___ Walk Independently    ___ Shower independently ___ Walk with assistance    ___ Shower with assistance _X__ No alcohol     ___ Return to work/school ________    COMMUNITY REFERRALS UPON DISCHARGE:    Home Health:   PT        RN                       Agency: [facility] Phone: [phone]   Medical Equipment/Items Ordered:  Rolling walker, tub seat                                                      Agency/Supplier: [facility] @ [phone]      Special Instructions:    My questions have been answered and I understand these instructions. I will adhere to these goals and the provided educational materials after my discharge from the hospital.  Patient/Caregiver Signature _______________________________ Date __________  Clinician Signature _______________________________________ Date __________  Please bring this form and your medication list with you to all your follow-up doctor's appointments.    Information on my medicine - Coumadin   (Warfarin)  This medication education was reviewed with me or my healthcare representative as part of my discharge preparation.   Why was Coumadin prescribed for you? Coumadin was prescribed for you because you have a blood clot or a medical condition that can cause an increased risk of forming blood clots. Blood clots can cause serious health problems by blocking the flow of  blood to the heart, lung, or brain. Coumadin can prevent harmful blood clots from forming. As a reminder your indication for Coumadin is:   Blood Clot Prevention After Heart Valve Surgery, Afib  What test will check on my response to Coumadin? While on Coumadin (warfarin) you will need to have an INR test regularly to ensure that your dose is keeping you in the desired range. The INR (international normalized ratio) number is calculated from the result of the laboratory test called prothrombin time (PT).  If an INR APPOINTMENT HAS NOT ALREADY BEEN MADE FOR YOU please schedule an appointment to have this lab work done by your health care provider within 7 days. Your INR goal is usually a number between:  2.5 to 3.5.  What  do you need to  know  About  COUMADIN? Take Coumadin (warfarin) exactly as prescribed by your healthcare provider about the same time each day.  DO NOT stop taking without talking to the doctor who prescribed the medication.  Stopping without other blood clot prevention medication to take the place of Coumadin may increase your risk of developing a new clot or stroke.  Get refills before you run out.  What do you do if you miss a dose? If you miss a  dose, take it as soon as you remember on the same day then continue your regularly scheduled regimen the next day.  Do not take two doses of Coumadin at the same time.  Important Safety Information A possible side effect of Coumadin (Warfarin) is an increased risk of bleeding. You should call your healthcare provider right away if you experience any of the following: ? Bleeding from an injury or your nose that does not stop. ? Unusual colored urine (red or dark brown) or unusual colored stools (red or black). ? Unusual bruising for unknown reasons. ? A serious fall or if you hit your head (even if there is no bleeding).  Some foods or medicines interact with Coumadin (warfarin) and might alter your response to warfarin. To help  avoid this: ? Eat a balanced diet, maintaining a consistent amount of Vitamin K. ? Notify your provider about major diet changes you plan to make. ? Avoid alcohol or limit your intake to 1 drink for women and 2 drinks for men per day. (1 drink is 5 oz. wine, 12 oz. beer, or 1.5 oz. liquor.)  Make sure that ANY health care provider who prescribes medication for you knows that you are taking Coumadin (warfarin).  Also make sure the healthcare provider who is monitoring your Coumadin knows when you have started a new medication including herbals and non-prescription products.  Coumadin (Warfarin)  Major Drug Interactions  Increased Warfarin Effect Decreased Warfarin Effect  Alcohol (large quantities) Antibiotics (esp. Septra/Bactrim, Flagyl, Cipro) Amiodarone (Cordarone) Aspirin (ASA) Cimetidine (Tagamet) Megestrol (Megace) NSAIDs (ibuprofen, naproxen, etc.) Piroxicam (Feldene) Propafenone (Rythmol SR) Propranolol (Inderal) Isoniazid (INH) Posaconazole (Noxafil) Barbiturates (Phenobarbital) Carbamazepine (Tegretol) Chlordiazepoxide (Librium) Cholestyramine (Questran) Griseofulvin Oral Contraceptives Rifampin Sucralfate (Carafate) Vitamin K   Coumadin (Warfarin) Major Herbal Interactions  Increased Warfarin Effect Decreased Warfarin Effect  Garlic Ginseng Ginkgo biloba Coenzyme Q10 Green tea St. Johns wort    Coumadin (Warfarin) FOOD Interactions  Eat a consistent number of servings per week of foods HIGH in Vitamin K (1 serving =  cup)  Collards (cooked, or boiled & drained) Kale (cooked, or boiled & drained) Mustard greens (cooked, or boiled & drained) Parsley *serving size only =  cup Spinach (cooked, or boiled & drained) Swiss chard (cooked, or boiled & drained) Turnip greens (cooked, or boiled & drained)  Eat a consistent number of servings per week of foods MEDIUM-HIGH in Vitamin K (1 serving = 1 cup)  Asparagus (cooked, or boiled & drained) Broccoli  (cooked, boiled & drained, or raw & chopped) Brussel sprouts (cooked, or boiled & drained) *serving size only =  cup Lettuce, raw (green leaf, endive, romaine) Spinach, raw Turnip greens, raw & chopped   These websites have more information on Coumadin (warfarin):  FailFactory.se; VeganReport.com.au;

## 2015-02-09 NOTE — Progress Notes (Signed)
Lance Cook PHYSICAL MEDICINE & REHABILITATION     PROGRESS NOTE    Subjective/Complaints: Had a resonable night--slept better. Anxiety improving. Wants to know if he needs oxygen via CPAP at night (has the attachment for this) ROS: Pt denies fever, rash/itching, headache, blurred or double vision, nausea, vomiting, abdominal pain, diarrhea, chest pain, shortness of breath, palpitations, dysuria, dizziness, neck or back pain, bleeding, or depression  Objective: Vital Signs: Blood pressure 120/61, pulse 80, temperature 98.9 F (37.2 C), temperature source Oral, resp. rate 18, weight 100.9 kg (222 lb 7.1 oz), SpO2 98 %. Dg Chest 2 View  02/08/2015   CLINICAL DATA:  CHF, mitral valve replacement  EXAM: CHEST  2 VIEW  COMPARISON:  Portable chest x-ray of February 07, 2015  FINDINGS: The lungs are better inflated today although there is persistent mild hypo inflation. There is no visible right-sided pneumothorax. The pulmonary interstitial markings remain increased greater in the right mid and lower lung than elsewhere. The cardiac silhouette remains enlarged. The central pulmonary vascularity is mildly engorged. There is a small right pleural effusion. There are 7 intact sternal wires. The mitral valve ring is visible. The right-sided PICC line tip projects over the midportion of the SVC. The bony thorax exhibits no acute abnormality.  IMPRESSION: Slight improved aeration of both lungs. Persistent mild interstitial edema bilaterally secondary to CHF. Subsegmental atelectasis in the right mid and lower lung with small effusion is present and stable.   Electronically Signed   By: David  Martinique M.D.   On: 02/08/2015 07:40    Recent Labs  02/07/15 0300 02/09/15 0507  WBC 10.7* 9.6  HGB 10.3* 10.7*  HCT 34.2* 36.0*  PLT 200 270    Recent Labs  02/08/15 0523 02/09/15 0507  NA 136 138  K 3.9 3.6  CL 96* 100*  GLUCOSE 107* 94  BUN 14 15  CREATININE 0.82 0.79  CALCIUM 8.1* 8.3*   CBG (last 3)    Recent Labs  02/08/15 1638 02/08/15 2123 02/09/15 0645  GLUCAP 192* 85 126*    Wt Readings from Last 3 Encounters:  02/09/15 100.9 kg (222 lb 7.1 oz)  02/08/15 104.826 kg (231 lb 1.6 oz)  01/22/15 111.041 kg (244 lb 12.8 oz)    Physical Exam:  Constitutional: He is oriented to person, place, and time. He appears well-developed and well-nourished.  Morbidly obese male.  HENT:  Head: Normocephalic and atraumatic.  Eyes: Conjunctivae are normal. Pupils are equal, round, and reactive to light.  Neck: Normal range of motion. Neck supple.  Cardiovascular: Normal rate. An irregular rhythm remains.  Positive valve click is present.  Respiratory: Effort normal and breath sounds normal. No respiratory distress. He has no wheezes.  GI: Soft. Bowel sounds are normal. There is no tenderness.  Musculoskeletal: Edema:  1+ to 2+ edema bilateral LE edema. Bilateral shins with stasis dermatitis.  Neurological: He is alert and oriented to person, place, and time. Still with limitations in awareness/insight. Able to follow basic commands without difficulty. Impulsive.  Motor strength is 4/5 bilateral deltoid, biceps, triceps, grip 3 minus bilateral hip flexor 4 minus bilateral knee extensor 4 bilateral ankle dorsiflexor plantar flexor Sensation intact to light touch in bilateral upper and lower limbs  Skin: Skin is warm and dry.  Dry flaky skin bilateral shins. Intergluteal ares with abraded areas due to MASD. several wounds/ abrasions along right chest wall. One suture remains in chest. Psych: patient a little anxious, distracted  Assessment/Plan: 1. Functional deficits secondary to  debility/encephalopathy after MVR which require 3+ hours per day of interdisciplinary therapy in a comprehensive inpatient rehab setting. Physiatrist is providing close team supervision and 24 hour management of active medical problems listed below. Physiatrist and rehab team continue to assess  barriers to discharge/monitor patient progress toward functional and medical goals. FIM:                                 Medical Problem List and Plan: 1. Functional deficits secondary to Deconditioning after mitral valve replacement 01/24/2015 in a patient with morbid obesity and postoperative encephalopathy 2. DVT Prophylaxis/Anticoagulation: Pharmaceutical: Coumadin--check INR's (high today) 3. Chronic pain/ Pain Management: oxycodone and robaxin prn---doesn't appear to be in overt pain 4. Delirium/Mood: Resolving--question component of drug withdrawal. Monitor with resumption of Buspar as well as restoril. LCSW to follow also 5. Neuropsych: This patient is capable of making decisions on his own behalf. 6. MASD sacrum/Skin/Wound Care: Routine pressure relief measures. Santyl with daily dressing changes to sacral MASD.  7. Fluids/Electrolytes/Nutrition: Monitor I/O. Offer supplements to help with low protein stores.  8. Fluid overload with anasarca: Improving with diuresis. Continue to monitor daily weights. 9. Chronic diastolic CHF: checking daily weights. Continue lasix, digoxin and atenolol.  10 PAF: Monitor heart rate every 8 hours. Continue atenolol, digoxin and coumadin.  11. OSA: Continue to use CPAP daily. 12. C diff colitis: continue to have diarrhea with bloating--used questran at home for GI symptoms. Continue Metronidazole for 14 days (through 6/13). 13. ABLA: hgb trending up 14. DM type 2: Diet controlled. Hgb A1C-5.9. Monitor BS with ac/hs checks. Use SSI for elevated BS.  14. Anxiety disorder: Buspar resumed.  Will monitor mood and taper off Risperdal early next week if mood remains appropriate- he does seem a bit anxious 16. Chronic insomnia: Resumed Restoril. 17. Leucocytosis: Improving. No signs of infection.   18. Minimally invasive MVR: Monitor wound for healing. No need for sternal precautions.  19. Chronic peripheral edema: elevation and  ACE wraps bilateral LE's LOS (Days) 1 A FACE TO FACE EVALUATION WAS PERFORMED  Lance Cook T 02/09/2015 10:58 AM

## 2015-02-09 NOTE — Evaluation (Signed)
Physical Therapy Assessment and Plan  Patient Details  Name: Lance Cook MRN: [id number] Date of Birth: 15-May-1961  PT Diagnosis: Abnormality of gait, Edema and Muscle weakness Rehab Potential: Good ELOS: 5-7 days   Today's Date: 02/09/2015 PT Individual Time: 0950-1105 PT Individual Time Calculation (min): 75 min    Problem List:  Patient Active Problem List   Diagnosis Date Noted  . Hypomagnesemia 02/08/2015  . Physical deconditioning 02/08/2015  . Encephalopathy acute 02/05/2015  . Hypokalemia 02/05/2015  . Enteritis due to Clostridium difficile 01/30/2015  . HCAP (healthcare-associated pneumonia) 01/29/2015  . Acute respiratory failure with hypoxemia   . ARDS (adult respiratory distress syndrome) 01/27/2015  . 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 62/37/6283  . Chronic diastolic congestive heart failure   . Atelectasis   . History of mechanical aortic valve replacement 12/25/2014  . 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 Severe mitral regurgitation 10/11/2014  . Cellulitis 04/02/2014  . Hypotension 04/02/2014  . Venous insufficiency of both lower extremities 04/02/2014  . Morbid obesity 04/02/2014  . Encounter for therapeutic drug monitoring 02/02/2014  . Mass of left side of neck 08/18/2012  . Head or neck swelling, mass, or lump 07/15/2012  . Long term (current) use of anticoagulants 07/15/2012  . Hypogonadism male 04/08/2012  . History of adenomatous polyp of colon 12/23/2011  . Testicular cancer   . TIA (transient ischemic attack)   . OSA (obstructive sleep apnea)   . Lumbar spinal stenosis 10/08/2011  . Chronic LBP 10/08/2011  . CVA (cerebral vascular accident) 10/08/2011  . Erectile dysfunction 10/08/2011  . Preventative health care 08/09/2011  . CAD (coronary artery disease)   . Varicose veins 05/01/2011  . SPINAL STENOSIS, LUMBAR 10/01/2010  . ULCER, LEG 04/29/2010  . Osteoarthritis of  spine 04/29/2010  . DYSLIPIDEMIA 01/15/2010  . Chronic venous hypertension with ulcer 01/15/2010  . Essential hypertension 12/14/2009  . MUSCLE CRAMPS 12/14/2009  . LEG PAIN, LEFT 11/29/2009  . DIABETES MELLITUS, TYPE II 11/01/2009  . GOUT 11/01/2009  . Iron deficiency anemia 11/01/2009  . MYOCARDIAL INFARCTION, HX OF 11/01/2009  . ATRIAL FIBRILLATION, CHRONIC 11/01/2009  . INTERNAL HEMORRHOIDS 11/01/2009  . PERIPHERAL EDEMA 11/01/2009  . H/O mechanical aortic valve replacement 11/01/2009  . Ascending aortic dissection 07/14/2008    Past Medical History:  Past Medical History  Diagnosis Date  . Hyperlipidemia   . Hypertension   . Arthritis   . Leg pain 06/28/2010  . Hiatal hernia   . Atrial fibrillation     chronic persistent  . Reflux 11/06/09  . Myocardial infarction age 30  . Varicose veins   . Obesity   . Peripheral vascular disease   . Gout   . CAD (coronary artery disease)     Old scar inferior wall myoview, 10/2009 EF 52%  . Chronic LBP 10/08/2011  . Impaired glucose tolerance 10/08/2011  . Erectile dysfunction 10/08/2011  . Testicular cancer   . Bell's palsy   . TIA (transient ischemic attack)     age 56  . CVA (cerebral infarction)     54yo  . OSA (obstructive sleep apnea)     CPAP  . History of colon polyps   . Anemia   . Hypogonadism male 04/08/2012  . Aortic aneurysm   . Aortic aneurysm and dissection   . 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     1988 - Dr Blase Mess  at [facility] in [place], [place]  . Ascending aortic dissection 07/14/2008    Localized dissection of ascending aorta noted on CTA in 2009 and stable on CTA in 2011  . Severe mitral regurgitation 10/11/2014  . Morbid obesity 04/02/2014  . Diabetes mellitus     diet controlled  . DIABETES MELLITUS, TYPE II 11/01/2009    Qualifier: Diagnosis of  By: [name] MD, [name]    . Dysrhythmia   . CHF (congestive heart failure)   . Shortness of breath  dyspnea     with exertion  . Anxiety   . GERD (gastroesophageal reflux disease)     not needing medication at thhis time- 01/22/15  . Stroke     TIA and Stroke no residual effect- 1st was 50  . Chronic diastolic congestive heart failure   . 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 1/61/0960    33 mm St Jude bileaflet mechanical valve placed via right mini thoracotomy approach   Past Surgical History:  Past Surgical History  Procedure Laterality Date  . Laser ablation  03/06/2010    left leg  . Cardiac valve replacement      [id number]  . Cholecystectomy  2011  . Tonsillectomy  1967  . Otoplasty      bilateral, age 30  . Orchiectomy  age 14    testicular cancer  . Tee without cardioversion N/A 10/11/2014    Procedure: TRANSESOPHAGEAL ECHOCARDIOGRAM (TEE);  Surgeon: Pixie Casino, MD;  Location: [facility] ENDOSCOPY;  Service: Cardiovascular;  Laterality: N/ADeneen Harts procedure  1988    25 mm St Jude mechanical valve conduit - Dr Blase Mess at [facility] in [place], [place]  . Coronary artery bypass graft    . Aortic truck    . Tee without cardioversion N/A 01/24/2015    Procedure: TRANSESOPHAGEAL ECHOCARDIOGRAM (TEE);  Surgeon: Rexene Alberts, MD;  Location: [facility];  Service: Open Heart Surgery;  Laterality: N/A;  . Mitral valve replacement Right 01/24/2015    Procedure: Re-Operation, MINIMALLY INVASIVE MITRAL VALVE (MV) REPLACEMENT.;  Surgeon: Rexene Alberts, MD;  Location: [place];  Service: Open Heart Surgery;  Laterality: Right;    Assessment & Plan Clinical Impression: Patient is a 54 y.o. year old male history of CAF, PVD, TIA's, CAD s/p CAD with AVR '99, severe MR who was admitted for minimally invasive MVR on 01/24/15 by Dr. Roxy Manns. Post op with hypoxemia due to edema v/s ARDS and required prolonged vent support. He was treated with aggressive diuresis as well as IV antibiotics for possible HCAP. He has had issues with mucous plugging as well as c  diff colitis and was started on metronidazole on 05/31. He tolerated extubation with prn BIPAP use on 06/03. He has had worsening of ICU delirium with confusion and hallucinations 6/05 and Risperdal was added with improvement in mentation. Fluid overload is improving with diuresis and he's is being weaned off oxygen. Rectal tube d/c as diarrhea has resolved. A fib is rate controlled. Patient transferred to CIR on 02/08/2015 .   Patient currently requires min with mobility secondary to muscle weakness and decreased cardiorespiratoy endurance.  Prior to hospitalization, patient was independent  with mobility and lived with Spouse in a Mobile home home.  Home access is 5Stairs to enter.  Patient will benefit from skilled PT intervention to maximize safe functional mobility, minimize fall risk and decrease caregiver burden for planned discharge home with 24 hour supervision.  Anticipate patient will  benefit from follow up [place] at discharge.  PT - End of Session Endurance Deficit: Yes Endurance Deficit Description: limited activity tolerance RPE 11 after ambulating 75' PT Assessment Rehab Potential (ACUTE/IP ONLY): Good PT Patient demonstrates impairments in the following area(s): Balance;Endurance;Motor PT Transfers Functional Problem(s): Bed Mobility;Bed to Chair;Car;Furniture;Floor PT Locomotion Functional Problem(s): Ambulation;Stairs PT Plan PT Intensity: Minimum of 1-2 x/day ,45 to 90 minutes PT Frequency: 5 out of 7 days PT Duration Estimated Length of Stay: 5-7 days PT Treatment/Interventions: Ambulation/gait training;Discharge planning;DME/adaptive equipment instruction;Functional mobility training;Therapeutic Activities;UE/LE Strength taining/ROM;Wheelchair propulsion/positioning;Therapeutic Exercise;Stair training;Patient/family education;Disease management/prevention;Balance/vestibular training;Community reintegration PT Transfers Anticipated Outcome(s): Mod I PT Locomotion Anticipated  Outcome(s): Mod I ambulation PT Recommendation Follow Up Recommendations: Home health PT Patient destination: Home Equipment Recommended: Rolling walker with 5" wheels  Skilled Therapeutic Intervention Patient seen and PT eval initiated with spouse present.  Patient performed functional transfers with min assist and cues for safety after donning pt's shoes.  Performed wheelchair mobility supervision with UE's till fatigued after 110'.  Patient transferred to stand with UE use on armrests and gait x 75' with RW and min assist increased anterior weight shift with hands heavy on walker.  Patient fatigued after ambulation with HR 83, SpO2 97% on room air RPE 11.  Patient negotitiated 4, 6.5" stairs with min assist and heavy UE use on railings.  Patient transferred wheelchair to mat minguard assist and sit<>supine supervision.  Patient mildly tearful during session due to length of time since putting on his own clothes and since being able to hug his wife.  Patient returned to room in wheelchair and left with OT session starting.  PT Evaluation Precautions/Restrictions Precautions Precautions: Fall Restrictions Weight Bearing Restrictions: No General   Vital SignsTherapy Vitals Temp: 98.9 F (37.2 C) Temp Source: Oral Pulse Rate: 83 Resp: 18 BP: 108/61 mmHg Patient Position (if appropriate): Lying Oxygen Therapy SpO2: 95 % O2 Device: Not Delivered Pain Pain Assessment Pain Assessment: 0-10 Pain Score: 0-No pain Home Living/Prior Functioning Home Living Available Help at Discharge: Family;Available 24 hours/day Type of Home: Mobile home Home Access: Stairs to enter Entrance Stairs-Number of Steps: 5 Entrance Stairs-Rails: Right Home Layout: One level Additional Comments: Pt reports that wife is 4'9" and can oly do minimal lifting.  She has visual deficits and pt has to help with with visual tasks.  He is the driver in the family.  They have investigated public transportation, but  [employer] [place] service [place] area of the [place]   Lives With: Spouse Prior Function Level of Independence: Independent with basic ADLs;Independent with gait;Independent with transfers;Independent with homemaking with ambulation  Able to Take Stairs?: Yes Vocation: On disability Comments: wife assisted his wife due to she is legally blind. wife assisted with LB due to his LE edema and chronic back pain  Cognition Overall Cognitive Status: Impaired/Different from baseline Arousal/Alertness: Awake/alert Attention: Selective Selective Attention: Impaired Memory: Impaired Awareness: Impaired Awareness Impairment: Emergent impairment Problem Solving: Impaired Problem Solving Impairment: Functional basic Sensation Sensation Light Touch: Appears Intact Stereognosis: Appears Intact Hot/Cold: Appears Intact Proprioception: Appears Intact Coordination Gross Motor Movements are Fluid and Coordinated: No Fine Motor Movements are Fluid and Coordinated: Yes Coordination and Movement Description: slowed pronation/supination Heel Shin Test: difficult due to edema and girth of legs Motor  Motor Motor: Within Functional Limits  Mobility Transfers Sit to Stand: 4: Min guard Stand to Sit: 4: Min Magazine features editor Distance: 110  Trunk/Postural Assessment  Cervical Assessment Cervical Assessment: Within Functional Limits  Thoracic Assessment Thoracic Assessment: Within Functional Limits Lumbar Assessment Lumbar Assessment: Within Functional Limits  Balance Balance Balance Assessed: Yes Dynamic Sitting Balance Sitting balance - Comments: minguard assist jue to slight post lean Static Standing Balance Static Standing - Level of Assistance: 5: Stand by assistance Dynamic Standing Balance Dynamic Standing - Level of Assistance: 4: Min assist Extremity Assessment  RUE Assessment RUE Assessment: Within Functional Limits LUE Assessment LUE Assessment: Within Functional  Limits RLE Assessment RLE Assessment: Exceptions to [facility] RLE Strength RLE Overall Strength Comments: Hip flexion 4-/5 knee extension 4/5, ankle DF 4/5 LLE Assessment LLE Assessment: Exceptions to [facility] LLE Strength LLE Overall Strength Comments: hip flexion 4-/5, knee extension 4/5, ankle DF 4/5  FIM:  FIM - Control and instrumentation engineer Devices: Walker;Arm rests Bed/Chair Transfer: 5: Supine > Sit: Supervision (verbal cues/safety issues);5: Sit > Supine: Supervision (verbal cues/safety issues);4: Bed > Chair or W/C: Min A (steadying Pt. > 75%);4: Chair or W/C > Bed: Min A (steadying Pt. > 75%) FIM - Locomotion: Wheelchair Distance: 110 Locomotion: Wheelchair: 2: Travels 50 - 149 ft with supervision, cueing or coaxing FIM - Locomotion: Ambulation Locomotion: Ambulation Assistive Devices: Walker - Rolling (75') Locomotion: Ambulation: 2: Travels 50 - 149 ft with minimal assistance (Pt.>75%) FIM - Locomotion: Stairs Locomotion: Scientist, physiological: Hand rail - 2 Locomotion: Stairs: 1: Up and Down < 4 stairs with minimal assistance (Pt.>75%)   Refer to Care Plan for Long Term Goals  Recommendations for other services: None  Discharge Criteria: Patient will be discharged from PT if patient refuses treatment 3 consecutive times without medical reason, if treatment goals not met, if there is a change in medical status, if patient makes no progress towards goals or if patient is discharged from hospital.  The above assessment, treatment plan, treatment alternatives and goals were discussed and mutually agreed upon: by patient  [facility] 02/09/2015, 5:12 PM  [place], [place] 02/09/2015

## 2015-02-09 NOTE — Progress Notes (Signed)
ANTICOAGULATION CONSULT NOTE - Initial Consult  Pharmacy Consult:  Coumadin Indication: atrial fibrillation  Allergies  Allergen Reactions  . Penicillins     REACTION: Hives  . Adhesive [Tape] Other (See Comments)    Shin irritation    Patient Measurements: Height = 61 inches Weight 104.8 kg  Vital Signs: Temp: 98.9 F (37.2 C) (06/10 0547) Temp Source: Oral (06/10 0547) BP: 120/61 mmHg (06/10 1013) Pulse Rate: 80 (06/10 1013)  Labs:  Recent Labs  02/07/15 0300 02/08/15 0523 02/09/15 0507  HGB 10.3*  --  10.7*  HCT 34.2*  --  36.0*  PLT 200  --  270  LABPROT 26.4* 30.5* 32.5*  INR 2.47* 2.98* 3.25*  CREATININE 0.78 0.82 0.79    Estimated Creatinine Clearance: 108.3 mL/min (by C-G formula based on Cr of 0.79).   Medical History: Past Medical History  Diagnosis Date  . Hyperlipidemia   . Hypertension   . Arthritis   . Leg pain 06/28/2010  . Hiatal hernia   . Atrial fibrillation     chronic persistent  . Reflux 11/06/09  . Myocardial infarction age 47  . Varicose veins   . Obesity   . Peripheral vascular disease   . Gout   . CAD (coronary artery disease)     Old scar inferior wall myoview, 10/2009 EF 52%  . Chronic LBP 10/08/2011  . Impaired glucose tolerance 10/08/2011  . Erectile dysfunction 10/08/2011  . Testicular cancer   . Omir Cooprider's palsy   . TIA (transient ischemic attack)     age 54  . CVA (cerebral infarction)     54yo  . OSA (obstructive sleep apnea)     CPAP  . History of colon polyps   . Anemia   . Hypogonadism male 04/08/2012  . Aortic aneurysm   . Aortic aneurysm and dissection   . 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     1988 - Dr Blase Mess at [facility] in [place], [place]  . Ascending aortic dissection 07/14/2008    Localized dissection of ascending aorta noted on CTA in 2009 and stable on CTA in 2011  . Severe mitral regurgitation 10/11/2014  . Morbid obesity 04/02/2014  . Diabetes mellitus      diet controlled  . DIABETES MELLITUS, TYPE II 11/01/2009    Qualifier: Diagnosis of  By: [name] MD, [name]    . Dysrhythmia   . CHF (congestive heart failure)   . Shortness of breath dyspnea     with exertion  . Anxiety   . GERD (gastroesophageal reflux disease)     not needing medication at thhis time- 01/22/15  . Stroke     TIA and Stroke no residual effect- 1st was 54  . Chronic diastolic congestive heart failure   . 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 10/20/7586    33 mm St Jude bileaflet mechanical valve placed via right mini thoracotomy approach      Assessment: 54 YOM with history of Afib and recent history of MVR on 01/24/15 to continue on Coumadin. INR 3.24 supratherapeutic this morning; CBC stable, no bleeding reported. Noted patient is on Flagyl since 5/31 for C.diff.   Goal of Therapy:  INR 2.5 - 3.5    Plan:  - Coumadin 2.5mg  po x 1 - Daily PT / INR - Watch INR closely while on Flagyl  Maryanna Shape, PharmD, BCPS  Clinical Pharmacist  Pager: [phone]  02/09/2015, 1:19 PM

## 2015-02-09 NOTE — Progress Notes (Signed)
1 suture removed from patient's right axillary area, patient tolerated well, no complications noted.

## 2015-02-10 ENCOUNTER — Inpatient Hospital Stay (HOSPITAL_COMMUNITY): Payer: Medicare HMO | Admitting: Occupational Therapy

## 2015-02-10 ENCOUNTER — Inpatient Hospital Stay (HOSPITAL_COMMUNITY): Payer: Self-pay | Admitting: Physical Therapy

## 2015-02-10 DIAGNOSIS — G9340 Encephalopathy, unspecified: Secondary | ICD-10-CM

## 2015-02-10 DIAGNOSIS — Z954 Presence of other heart-valve replacement: Secondary | ICD-10-CM

## 2015-02-10 LAB — GLUCOSE, CAPILLARY
GLUCOSE-CAPILLARY: 94 mg/dL (ref 65–99)
Glucose-Capillary: 65 mg/dL (ref 65–99)
Glucose-Capillary: 80 mg/dL (ref 65–99)
Glucose-Capillary: 99 mg/dL (ref 65–99)
Glucose-Capillary: 103 mg/dL — ABNORMAL HIGH (ref 65–99)

## 2015-02-10 LAB — PROTIME-INR
INR: 3.19 — AB (ref 0.00–1.49)
Prothrombin Time: 32.1 seconds — ABNORMAL HIGH (ref 11.6–15.2)

## 2015-02-10 MED ORDER — WARFARIN SODIUM 5 MG PO TABS
5.0000 mg | ORAL_TABLET | Freq: Once | ORAL | Status: AC
  Administered 2015-02-10: 5 mg via ORAL
  Filled 2015-02-10: qty 1

## 2015-02-10 NOTE — Progress Notes (Signed)
Occupational Therapy Session Note  Patient Details  Name: Lance Cook MRN: [id number] Date of Birth: 08/12/1961  Today's Date: 02/10/2015 OT Individual Time: 0900-1000 OT Individual Time Calculation (min): 60 min    Short Term Goals: Week 1:  OT Short Term Goal 1 (Week 1): STG=LTG  Skilled Therapeutic Interventions/Progress Updates:   Patient seen for ADL retraining and instruction this morning with emphasis on ADL performance, postural control, functional mobility, energy conservation, safety, and activity tolerance.  Patient upright in recliner chair upon therapist arrival.  Patient transfers chair to shower seat via stand step ambulation with use of RW with CGA to SBA.  Patient doffs clothing in standing with close SBA for safety during dynamic standing balance task.  Patient completes ADL shower level seated and standing with min assist steadying balance in standing and when leaning down to lower legs.  Patient instructed in use of washcloth on floor to wash bottom of B feet and use of long towel to wash feet.  Patient completes dressing wheelchair level at the sink.  Patient completes UB dressing with setup and LB dressing with max assist for donning B socks/shoes and tie B shoes.  Patient grooms with setup at the sink.   Therapy Documentation Precautions:  Precautions Precautions: Fall Restrictions Weight Bearing Restrictions: No Pain:  Denies pain ADL: ADL ADL Comments: see FIM  See FIM for current functional status  Therapy/Group: Individual Therapy  Osa Craver 02/10/2015, 12:30 PM

## 2015-02-10 NOTE — Progress Notes (Signed)
[facility] PHYSICAL MEDICINE & REHABILITATION     PROGRESS NOTE    Subjective/Complaints: Tolerated CPAP. Up in chair early this am. Therapies went well. No new pains.   ROS: Pt denies fever, rash/itching, headache, blurred or double vision, nausea, vomiting, abdominal pain, diarrhea, chest pain, shortness of breath, palpitations, dysuria, dizziness, neck or back pain, bleeding, or depression  Objective: Vital Signs: Blood pressure 102/62, pulse 78, temperature 97.9 F (36.6 C), temperature source Oral, resp. rate 18, weight 101.4 kg (223 lb 8.7 oz), SpO2 97 %. No results found.  Recent Labs  02/09/15 0507  WBC 9.6  HGB 10.7*  HCT 36.0*  PLT 270    Recent Labs  02/08/15 0523 02/09/15 0507  NA 136 138  K 3.9 3.6  CL 96* 100*  GLUCOSE 107* 94  BUN 14 15  CREATININE 0.82 0.79  CALCIUM 8.1* 8.3*   CBG (last 3)   Recent Labs  02/09/15 1654 02/09/15 2118 02/10/15 0631  GLUCAP 84 98 94    Wt Readings from Last 3 Encounters:  02/10/15 101.4 kg (223 lb 8.7 oz)  02/08/15 104.826 kg (231 lb 1.6 oz)  01/22/15 111.041 kg (244 lb 12.8 oz)    Physical Exam:  Constitutional: He is oriented to person, place, and time. He appears well-developed and well-nourished.  Morbidly obese male.  HENT:  Head: Normocephalic and atraumatic.  Eyes: Conjunctivae are normal. Pupils are equal, round, and reactive to light.  Neck: Normal range of motion. Neck supple.  Cardiovascular: Normal rate. An irregular rhythm is present  Positive valve click is present.  Respiratory: Effort normal and breath sounds normal. No respiratory distress. He has no wheezes.  GI: Soft. Bowel sounds are normal. There is no tenderness.  Musculoskeletal: Edema:  1+ to 2+ edema bilateral LE edema. Bilateral shins with stasis dermatitis.  Neurological: He is alert and oriented to person, place, and time. Has basic insight and awareness Motor strength is 4/5 bilateral deltoid, biceps, triceps,  grip 3 minus bilateral hip flexor 4 minus bilateral knee extensor 4 bilateral ankle dorsiflexor plantar flexor Sensation intact to light touch in bilateral upper and lower limbs  Skin: Skin is warm and dry.  Dry  skin bilateral shins. Intergluteal ares with abraded areas due to MASD. several wounds/ abrasions along right chest wall. One suture remains in chest. Psych: patient a little anxious, distracted  Assessment/Plan: 1. Functional deficits secondary to debility/encephalopathy after MVR which require 3+ hours per day of interdisciplinary therapy in a comprehensive inpatient rehab setting. Physiatrist is providing close team supervision and 24 hour management of active medical problems listed below. Physiatrist and rehab team continue to assess barriers to discharge/monitor patient progress toward functional and medical goals. FIM: FIM - Bathing Bathing Steps Patient Completed: Chest, Right Arm, Left Arm, Abdomen, Front perineal area, Right upper leg, Left upper leg Bathing: 3: Mod-Patient completes 5-7 82f 10 parts or 50-74%  FIM - Upper Body Dressing/Undressing Upper body dressing/undressing steps patient completed: Thread/unthread right sleeve of pullover shirt/dresss, Thread/unthread left sleeve of pullover shirt/dress, Put head through opening of pull over shirt/dress Upper body dressing/undressing: 4: Min-Patient completed 75 plus % of tasks FIM - Lower Body Dressing/Undressing Lower body dressing/undressing steps patient completed: Thread/unthread right underwear leg, Thread/unthread left underwear leg, Pull underwear up/down, Thread/unthread right pants leg, Thread/unthread left pants leg, Pull pants up/down Lower body dressing/undressing: 4: Min-Patient completed 75 plus % of tasks  FIM - Toileting Toileting steps completed by patient: Adjust clothing prior to toileting, Performs  perineal hygiene, Adjust clothing after toileting Toileting: 4: Steadying assist  FIM - Glass blower/designer Devices: Grab bars Toilet Transfers: 4-To toilet/BSC: Min A (steadying Pt. > 75%), 4-From toilet/BSC: Min A (steadying Pt. > 75%)  FIM - Bed/Chair Transfer Bed/Chair Transfer Assistive Devices: Walker, Arm rests Bed/Chair Transfer: 5: Supine > Sit: Supervision (verbal cues/safety issues), 5: Sit > Supine: Supervision (verbal cues/safety issues), 4: Bed > Chair or W/C: Min A (steadying Pt. > 75%), 4: Chair or W/C > Bed: Min A (steadying Pt. > 75%)  FIM - Locomotion: Wheelchair Distance: 110 Locomotion: Wheelchair: 2: Travels 50 - 149 ft with supervision, cueing or coaxing FIM - Locomotion: Ambulation Locomotion: Ambulation Assistive Devices: Walker - Rolling (75') Locomotion: Ambulation: 2: Travels 50 - 149 ft with minimal assistance (Pt.>75%)  Comprehension Comprehension: 5-Follows basic conversation/direction: With extra time/assistive device  Expression Expression: 5-Expresses basic 90% of the time/requires cueing < 10% of the time.  Social Interaction Social Interaction: 5-Interacts appropriately 90% of the time - Needs monitoring or encouragement for participation or interaction.  Problem Solving Problem Solving: 5-Solves basic problems: With no assist  Memory Memory: 6-More than reasonable amt of time Medical Problem List and Plan: 1. Functional deficits secondary to Deconditioning after mitral valve replacement 01/24/2015 in a patient with morbid obesity and postoperative encephalopathy 2. DVT Prophylaxis/Anticoagulation: Pharmaceutical: Coumadin--check INR's (high today) 3. Chronic pain/ Pain Management: oxycodone and robaxin prn---doesn't appear to be in overt pain 4. Delirium/Mood: Resolving--Monitor with resumption of Buspar as well as restoril. LCSW to follow also 5. Neuropsych: This patient is capable of making decisions on his own behalf. 6. MASD sacrum/Skin/Wound Care: Routine pressure relief measures. Santyl with daily dressing  changes to sacral MASD.  7. Fluids/Electrolytes/Nutrition: Monitor I/O. Offer supplements to help with low protein stores.  8. Fluid overload with anasarca: Improving with diuresis. Continue to monitor daily weights. 9. Chronic diastolic CHF: checking daily weights. (101kg) Continue lasix, digoxin and atenolol.  10 PAF: Monitor heart rate every 8 hours. Continue atenolol, digoxin and coumadin.  11. OSA: Continue to use CPAP daily. 12. C diff colitis: continue to have diarrhea with bloating--used questran at home for GI symptoms. Continue Metronidazole for 14 days (through 6/13). 13. ABLA: hgb trending up 14. DM type 2: Diet controlled. Hgb A1C-5.9. sugars quite low.  May be able to liberalize diet  14. Anxiety disorder: Buspar resumed.  Will monitor mood and taper off Risperdal early next week if mood remains appropriate- he does seem a bit anxious 16. Chronic insomnia: Resumed Restoril. 17. Leucocytosis: Improving. No signs of infection.   18. Minimally invasive MVR: Monitor wound for healing. No need for sternal precautions.  19. Chronic peripheral edema: elevation and ACE wraps bilateral LE's   LOS (Days) 2 A FACE TO FACE EVALUATION WAS PERFORMED  SWARTZ,ZACHARY T 02/10/2015 9:42 AM

## 2015-02-10 NOTE — IPOC Note (Addendum)
Overall Plan of Care [facility]) Patient Details Name: Lance Cook MRN: [id number] DOB: 11-27-1960  Admitting Diagnosis: debility  valve replant  Hospital Problems: Principal Problem:   Physical deconditioning Active Problems:   Essential hypertension   Chronic diastolic congestive heart failure   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   Encephalopathy acute     Functional Problem List: Nursing Bladder, Bowel, Pain, Safety, Endurance, Medication Management, Skin Integrity, Edema, Sensory  PT Balance, Endurance, Motor  OT Balance, Cognition, Edema, Endurance, Pain, Safety, Skin Integrity  SLP Cognition  TR Activity tolerance, functional mobility, balance,safety, pain, skin integrity       Basic ADL's: OT       Advanced  ADL's: OT       Transfers: PT Bed Mobility, Bed to Chair, Car, Furniture, Floor  OT Toilet, Metallurgist: PT Ambulation, Stairs     Additional Impairments: OT None  SLP Social Cognition   Problem Solving, Memory, Attention, Awareness  TR      Anticipated Outcomes Item Anticipated Outcome  Self Feeding    Swallowing      Basic self-care  mod I   Toileting  mod I    Bathroom Transfers mod I   Bowel/Bladder  To be continent of bladder and bowel   Transfers  Mod I  Locomotion  Mod I ambulation  Communication     Cognition  Supervision   Pain  Pain <3   Safety/Judgment  Min A transfers   Therapy Plan: PT Intensity: Minimum of 1-2 x/day ,45 to 90 minutes PT Frequency: 5 out of 7 days PT Duration Estimated Length of Stay: 5-7 days OT Intensity: Minimum of 1-2 x/day, 45 to 90 minutes OT Frequency: 5 out of 7 days OT Duration/Estimated Length of Stay: 5-7 days SLP Intensity: Minumum of 1-2 x/day, 30 to 90 minutes SLP Frequency: 3 to 5 out of 7 days SLP Duration/Estimated Length of Stay: TBD  TR Duration/ELOS:  5 days TR Frequency:  Min 1 time per week >20 minutes        Team  Interventions: Nursing Interventions Pain Management, Bowel Management, Patient/Family Education, Medication Management, Bladder Management, Disease Management/Prevention, Discharge Planning, Skin Care/Wound Management, Psychosocial Support  PT interventions Ambulation/gait training, Discharge planning, DME/adaptive equipment instruction, Functional mobility training, Therapeutic Activities, UE/LE Strength taining/ROM, Wheelchair propulsion/positioning, Therapeutic Exercise, Stair training, Patient/family education, Disease management/prevention, Training and development officer, Community reintegration  OT Interventions Training and development officer, Cognitive remediation/compensation, Academic librarian, Discharge planning, DME/adaptive equipment instruction, Functional mobility training, Pain management, Psychosocial support, Skin care/wound managment, Therapeutic Activities, UE/LE Strength taining/ROM, UE/LE Coordination activities, Therapeutic Exercise, Splinting/orthotics, Self Care/advanced ADL retraining, Patient/family education  SLP Interventions Cognitive remediation/compensation, Cueing hierarchy, Functional tasks, Internal/external aids, Environmental controls, Patient/family education, Therapeutic Activities  TR Interventions   Recreation/leisure participation, Balance/Vestibular training, functional mobility, therapeutic activities, UE/LE strength/coordination,  community reintegration, pt/family education, adaptive equipment instruction/use, discharge planning, psychosocial support  SW/CM Interventions Discharge Planning, Barrister's clerk, Patient/Family Education    Team Discharge Planning: Destination: PT-Home ,OT- Home , SLP-Home Projected Follow-up: PT-Home health PT, OT-  None, SLP-24 hour supervision/assistance, Outpatient SLP Projected Equipment Needs: PT-Rolling walker with 5" wheels, OT- To be determined, SLP-  Equipment Details: PT- , OT-  Patient/family involved in  discharge planning: PT- Patient, Family member/caregiver,  OT-Patient, Family member/caregiver, SLP-   MD ELOS: 6-7 days Medical Rehab Prognosis:  Excellent Assessment: The patient has been admitted for CIR therapies with the diagnosis of debility and encephalopathy after MVR. The  team will be addressing functional mobility, strength, stamina, balance, safety, adaptive techniques and equipment, self-care, bowel and bladder mgt, patient and caregiver education, NMR, cognitive perceptual awareness, activity tolerance, communication, ego support. Goals have been set at mod I for mobility and self-care and supervision for cognition.    Meredith Staggers, MD, FAAPMR      See Team Conference Notes for weekly updates to the plan of care

## 2015-02-10 NOTE — Progress Notes (Signed)
Hypoglycemic Event  CBG: 65  Treatment: 15 GM carbohydrate snack  Symptoms: None  Follow-up CBG: Time:1250 CBG Result:80  Possible Reasons for Event: Unknown  Comments/MD notified:Dr. Naaman Plummer notified; new order to remove carb modified restriction from diet.    Tina Griffiths  Remember to initiate Hypoglycemia Order Set & complete

## 2015-02-10 NOTE — Progress Notes (Addendum)
Physical Therapy Session Note  Patient Details  Name: Lance Cook MRN: [id number] Date of Birth: June 17, 1961  Today's Date: 02/10/2015 PT Individual Time: 1100-1200 , 1400-1445 PT Individual Time Calculation (min): 60 min, 45 min  Short Term Goals: Week 1:  PT Short Term Goal 1 (Week 1): STG=LTG due to ELOS    Skilled Therapeutic Interventions/Progress Updates:   Pt demonstrates improvement increasing ambulation distance. Pt with interest in martial arts so modified Tai Chi exercise given to pt as HEP (encourages breathing, multiplanar movements, and motivation for activity). More LLE instability noted than RLE.  Pt would continue to benefit from skilled PT services to increase functional mobility.   Therapy Documentation Precautions:  Precautions Precautions: Fall Restrictions Weight Bearing Restrictions: No Vital Signs: Therapy Vitals Pulse Rate: 83 BP: (!) 125/96 mmHg Oxygen Therapy SpO2: 98 % O2 Device: Not Delivered Pain: Pain Assessment Pain Assessment: 0-10 Pain Score: 5  Mobility:  Min Guard for transfers with cues for eccentric control, and safety Locomotion :   125 Min Guard with cues for pacing and posture, 25', 15'x3 with no AD and cues for posture and pacing. 4 stairs with Min A and cues for sequencing and technique Other Treatments:    Tx 1: Standing Tai Chi forms with cues for decreased velocity, ventilation, and sternal precautions. Pt educated on HEP, safety in mobility, and rehab plan. Pt performs transfers x10. Pt performs gait trials x3. Heel raises 2x10. BLE Retrograde massage to BLE. Gastroc stretch 2x30". Pt performs dynamic standing balance with reaching, grasping, and weight shifting within functional task.   Tx 2: Pt educated on rehab plan, safety in mobility, and progressing activities. Pt performs static standing with/without narrow BOS, EO/EC, foam/no foam 15"x2 each. Foam step-ups and side step-ups 2x3. L sided weight shifting performed while LLE on  foam. Scap retraction 2x10. HS isometrics, seated knee flexion, standing glute sets 2x10. Stairs performed.  See FIM for current functional status  Therapy/Group: Individual Therapy  [name] 02/10/2015, 12:55 PM

## 2015-02-10 NOTE — Progress Notes (Signed)
ANTICOAGULATION CONSULT NOTE - Follow Up Consult  Pharmacy Consult for Coumadin Indication: mechanical heart valves  Allergies  Allergen Reactions  . Penicillins     REACTION: Hives  . Adhesive [Tape] Other (See Comments)    Shin irritation    Patient Measurements: Weight: 223 lb 8.7 oz (101.4 kg) Heparin Dosing Weight:    Vital Signs: Temp: 97.9 F (36.6 C) (06/11 0520) Temp Source: Oral (06/11 0520) BP: 125/96 mmHg (06/11 1255) Pulse Rate: 83 (06/11 1255)  Labs:  Recent Labs  02/08/15 0523 02/09/15 0507 02/10/15 0453  HGB  --  10.7*  --   HCT  --  36.0*  --   PLT  --  270  --   LABPROT 30.5* 32.5* 32.1*  INR 2.98* 3.25* 3.19*  CREATININE 0.82 0.79  --     Estimated Creatinine Clearance: 108.6 mL/min (by C-G formula based on Cr of 0.79).   Assessment: 58 YOM with history of Afib and recent history of MVR on 01/24/15 to continue on Coumadin. Transferred to Rehab 6/9.  AC: s/p mech MVR 5/25. Restarted warf on 5/27 -(MD) PTA for afib and mech AVR (started on flagyl on 5/31, watch INR closely) (avoiding hep/lovenox with concerns for HIT 5/29 -- NEG); no reported significant s/s bleeding. INR 3.19 remains in goal. --Coumadin PTA: 5mg /day except 7.5mg  on Wed (stopped 5/17), was also on lovenox PTA. INR 1.5 at last clinic visit GOAL: 2.5-3.5  Goal of Therapy:  INR 2.5-3.5 Monitor platelets by anticoagulation protocol: Yes   Plan:  Coumadin 5mg  po x 1 tonight Daily INR.  Lance Cook, PharmD, BCPS Clinical Staff Pharmacist Pager [phone]  Lance Cook 02/10/2015,2:09 PM

## 2015-02-10 NOTE — Progress Notes (Signed)
Placed patient on home CPAP for the night  

## 2015-02-10 NOTE — Progress Notes (Signed)
Occupational Therapy Session Note  Patient Details  Name: Lance Cook MRN: [id number] Date of Birth: [birth year]  Today's Date: 02/10/2015 OT Individual Time: 1500-1530 OT Individual Time Calculation (min): 30 min    Short Term Goals: Week 1:  OT Short Term Goal 1 (Week 1): STG=LTG  Skilled Therapeutic Interventions/Progress Updates:   Patient seen for continued ADL retraining with functional mobility toilet transfers and toileting activity.  Patient completes chair <> toilet via stand pivot transfer with RW stand step ambulation with close SBA.  Toileting completed with SBA stand balance for clothing management down/up.  Patient completes grooming standing at the sink with supervision/SBA to wash hands.  Patient completes dynamic standing balance and strengthening exercises with 3# weighted ball for chest press and bicep curls, 3 sets x 10 reps.  Wife present for observation during session.   Therapy Documentation Precautions:  Precautions Precautions: Fall Restrictions Weight Bearing Restrictions: No Vital Signs: Therapy Vitals Temp: 99.3 F (37.4 C) Temp Source: Oral Pulse Rate: 90 Resp: 18 BP: (!) 105/49 mmHg Patient Position (if appropriate): Sitting Oxygen Therapy SpO2: 95 % O2 Device: Not Delivered Pain: Pain Assessment Pain Assessment: 0-10 Pain Score: 6  Pain Type: Acute pain Pain Location: Back Pain Orientation: Lower Pain Descriptors / Indicators: Aching;Sore Pain Frequency: Occasional Pain Onset: Sudden Patients Stated Pain Goal: 3 Pain Intervention(s): Medication (See eMAR);Repositioned;Emotional support Multiple Pain Sites: No ADL: ADL ADL Comments: see FIM  See FIM for current functional status  Therapy/Group: Individual Therapy  Osa Craver 02/10/2015, 3:48 PM

## 2015-02-11 ENCOUNTER — Inpatient Hospital Stay (HOSPITAL_COMMUNITY): Payer: Medicare HMO | Admitting: Physical Therapy

## 2015-02-11 ENCOUNTER — Encounter (HOSPITAL_COMMUNITY): Payer: Self-pay | Admitting: *Deleted

## 2015-02-11 ENCOUNTER — Inpatient Hospital Stay (HOSPITAL_COMMUNITY): Payer: Self-pay | Admitting: Physical Therapy

## 2015-02-11 DIAGNOSIS — A047 Enterocolitis due to Clostridium difficile: Secondary | ICD-10-CM

## 2015-02-11 LAB — URINE CULTURE
COLONY COUNT: NO GROWTH
Culture: NO GROWTH

## 2015-02-11 LAB — GLUCOSE, CAPILLARY
Glucose-Capillary: 77 mg/dL (ref 65–99)
Glucose-Capillary: 82 mg/dL (ref 65–99)
Glucose-Capillary: 73 mg/dL (ref 65–99)
Glucose-Capillary: 103 mg/dL — ABNORMAL HIGH (ref 65–99)

## 2015-02-11 LAB — PROTIME-INR
INR: 3.34 — ABNORMAL HIGH (ref 0.00–1.49)
PROTHROMBIN TIME: 33.2 s — AB (ref 11.6–15.2)

## 2015-02-11 MED ORDER — FUROSEMIDE 40 MG PO TABS
60.0000 mg | ORAL_TABLET | Freq: Two times a day (BID) | ORAL | Status: DC
  Administered 2015-02-11 – 2015-02-12 (×2): 60 mg via ORAL
  Filled 2015-02-11 (×5): qty 1

## 2015-02-11 MED ORDER — WARFARIN SODIUM 5 MG PO TABS
5.0000 mg | ORAL_TABLET | Freq: Once | ORAL | Status: AC
  Administered 2015-02-11: 5 mg via ORAL
  Filled 2015-02-11: qty 1

## 2015-02-11 NOTE — Care Management Note (Signed)
[facility] Individual Statement of Services  Patient Name:  Lance Cook  Date:  02/10/2015  Welcome to the [facility].  Our goal is to provide you with an individualized program based on your diagnosis and situation, designed to meet your specific needs.  With this comprehensive rehabilitation program, you will be expected to participate in at least 3 hours of rehabilitation therapies Monday-Friday, with modified therapy programming on the weekends.  Your rehabilitation program will include the following services:  Physical Therapy (PT), Occupational Therapy (OT), 24 hour per day rehabilitation nursing, Therapeutic Recreaction (TR), Case Management (Social Worker), Rehabilitation Medicine, Nutrition Services and Pharmacy Services  Weekly team conferences will be held on Tuesdays to discuss your progress.  Your Social Worker will talk with you frequently to get your input and to update you on team discussions.  Team conferences with you and your family in attendance may also be held.  Expected length of stay: 7-10 days  Overall anticipated outcome: supervision  Depending on your progress and recovery, your program may change. Your Social Worker will coordinate services and will keep you informed of any changes. Your Social Worker's name and contact numbers are listed  below.  The following services may also be recommended but are not provided by the [facility] will be made to provide these services after discharge if needed.  Arrangements include referral to agencies that provide these services.  Your insurance has been verified to be:  [employer] Your primary doctor is:  Roe Coombs  Pertinent information will be shared with your doctor and your insurance company.  Social Worker:   [place], [place] or ([phone]   Information discussed with and copy given to patient by: Lennart Pall, 02/10/2015, 12:44 PM

## 2015-02-11 NOTE — Progress Notes (Signed)
PHYSICAL MEDICINE & REHABILITATION     PROGRESS NOTE    Subjective/Complaints: cbg's have been low--diet adjusted. Concerned about swelling in both legs. Overall progressing  ROS: Pt denies fever, rash/itching, headache, blurred or double vision, nausea, vomiting, abdominal pain, diarrhea, chest pain, shortness of breath, palpitations, dysuria, dizziness, neck or back pain, bleeding, or depression  Objective: Vital Signs: Blood pressure 123/74, pulse 63, temperature 98.7 F (37.1 C), temperature source Oral, resp. rate 18, weight 101.6 kg (223 lb 15.8 oz), SpO2 97 %. No results found.  Recent Labs  02/09/15 0507  WBC 9.6  HGB 10.7*  HCT 36.0*  PLT 270    Recent Labs  02/09/15 0507  NA 138  K 3.6  CL 100*  GLUCOSE 94  BUN 15  CREATININE 0.79  CALCIUM 8.3*   CBG (last 3)   Recent Labs  02/10/15 1644 02/10/15 2149 02/11/15 0712  GLUCAP 99 103* 77    Wt Readings from Last 3 Encounters:  02/11/15 101.6 kg (223 lb 15.8 oz)  02/08/15 104.826 kg (231 lb 1.6 oz)  01/22/15 111.041 kg (244 lb 12.8 oz)    Physical Exam:  Constitutional: He is oriented to person, place, and time. He appears well-developed and well-nourished.  Morbidly obese male.  HENT:  Head: Normocephalic and atraumatic.  Eyes: Conjunctivae are normal. Pupils are equal, round, and reactive to light.  Neck: Normal range of motion. Neck supple.  Cardiovascular: Normal rate. An irregular rhythm is present  Positive valve click is present.  Respiratory: Effort normal and breath sounds normal. No respiratory distress. He has no wheezes.  GI: Soft. Bowel sounds are normal. There is no tenderness.  Musculoskeletal: Edema:    2+ edema bilateral LE edema, left greater than right. Bilateral shins with stasis dermatitis.  Neurological: He is alert and oriented to person, place, and time. Has basic insight and awareness Motor strength is 4/5 bilateral deltoid, biceps, triceps, grip 3  minus bilateral hip flexor 4 minus bilateral knee extensor 4 bilateral ankle dorsiflexor plantar flexor Sensation intact to light touch in bilateral upper and lower limbs  Skin: Skin is warm and dry.  Dry  skin bilateral shins. Intergluteal ares with abraded areas due to MASD. several wounds/ abrasions along right chest wall. One suture remains in chest. Psych: patient a little anxious, distracted  Assessment/Plan: 1. Functional deficits secondary to debility/encephalopathy after MVR which require 3+ hours per day of interdisciplinary therapy in a comprehensive inpatient rehab setting. Physiatrist is providing close team supervision and 24 hour management of active medical problems listed below. Physiatrist and rehab team continue to assess barriers to discharge/monitor patient progress toward functional and medical goals. FIM: FIM - Bathing Bathing Steps Patient Completed: Chest, Right Arm, Left Arm, Abdomen, Front perineal area, Buttocks, Right upper leg, Left upper leg, Left lower leg (including foot), Right lower leg (including foot) Bathing: 4: Steadying assist  FIM - Upper Body Dressing/Undressing Upper body dressing/undressing steps patient completed: Thread/unthread right sleeve of pullover shirt/dresss, Thread/unthread left sleeve of pullover shirt/dress, Put head through opening of pull over shirt/dress, Pull shirt over trunk Upper body dressing/undressing: 5: Set-up assist to: Obtain clothing/put away FIM - Lower Body Dressing/Undressing Lower body dressing/undressing steps patient completed: Thread/unthread right underwear leg, Thread/unthread left underwear leg, Pull underwear up/down, Thread/unthread right pants leg, Thread/unthread left pants leg, Pull pants up/down Lower body dressing/undressing: 2: Max-Patient completed 25-49% of tasks  FIM - Toileting Toileting steps completed by patient: Adjust clothing prior to toileting, Performs perineal  hygiene, Adjust clothing after  toileting Toileting: 5: Supervision: Safety issues/verbal cues (close SBA)  FIM - Air cabin crew Transfers Assistive Devices: Insurance account manager Transfers: 5-To toilet/BSC: Supervision (verbal cues/safety issues), 5-From toilet/BSC: Supervision (verbal cues/safety issues)  FIM - Control and instrumentation engineer Devices: Walker, Arm rests Bed/Chair Transfer: 5: Supine > Sit: Supervision (verbal cues/safety issues), 5: Sit > Supine: Supervision (verbal cues/safety issues), 4: Bed > Chair or W/C: Min A (steadying Pt. > 75%), 4: Chair or W/C > Bed: Min A (steadying Pt. > 75%)  FIM - Locomotion: Wheelchair Distance: 110 Locomotion: Wheelchair: 0: Activity did not occur FIM - Locomotion: Ambulation Locomotion: Ambulation Assistive Devices: Walker - Rolling (25') Locomotion: Ambulation: 1: Travels less than 50 ft with minimal assistance (Pt.>75%)  Comprehension Comprehension: 5-Follows basic conversation/direction: With extra time/assistive device  Expression Expression: 5-Expresses basic 90% of the time/requires cueing < 10% of the time.  Social Interaction Social Interaction: 5-Interacts appropriately 90% of the time - Needs monitoring or encouragement for participation or interaction.  Problem Solving Problem Solving: 5-Solves basic problems: With no assist  Memory Memory: 6-More than reasonable amt of time Medical Problem List and Plan: 1. Functional deficits secondary to Deconditioning after mitral valve replacement 01/24/2015 in a patient with morbid obesity and postoperative encephalopathy 2. DVT Prophylaxis/Anticoagulation: Pharmaceutical: Coumadin per pharmacy--remains supratherapeutic 3. Chronic pain/ Pain Management: oxycodone and robaxin prn---doesn't appear to be in overt pain 4. Delirium/Mood: Resolving--Monitor with resumption of Buspar as well as restoril. LCSW to follow also  Has been emotionally stabile on rehab thus far 5. Neuropsych: This patient  is capable of making decisions on his own behalf. 6. MASD sacrum/Skin/Wound Care: Routine pressure relief measures. Santyl with daily dressing changes to sacral MASD.   -surgery following along 7. Fluids/Electrolytes/Nutrition: encourage PO. Check labs monday 8. Fluid overload with anasarca: Improving with diuresis. Continue to monitor daily weights. 9. Chronic diastolic CHF: checking daily weights. (101.9kg) ---trending back up---edema 2++ on exam.  -increase lasix to 60mg  bid for now.  -continue digoxin and atenolol.   -ACE wrap bilateral LE's 10 PAF: Monitor heart rate every 8 hours. Continue atenolol, digoxin and coumadin.  11. OSA: Continue to use CPAP daily. 12. C diff colitis: Metronidazole for 14 days (through 6/13). Symptoms resolving 13. ABLA: hgb trending up 14. DM type 2: Diet controlled. Hgb A1C-5.9. sugars quite low.  i liberalized diet  14. Anxiety disorder: Buspar resumed.  Will monitor mood and taper off Risperdal early next week if mood remains appropriate- he does seem a bit anxious 16. Chronic insomnia: Resumed Restoril. 17. Leucocytosis: Improving. No signs of infection.   18. Minimally invasive MVR: Monitor wound for healing. No need for sternal precautions.  19. Chronic peripheral edema: elevation and ACE wraps bilateral LE's as above   LOS (Days) 3 A FACE TO FACE EVALUATION WAS PERFORMED  Arielys Wandersee T 02/11/2015 10:12 AM

## 2015-02-11 NOTE — Progress Notes (Signed)
       [address]       [place] [address]             [phone]               Subjective: Feels that rehab progress is "slow", but overall feels well.   Objective: Vital signs in last 24 hours: Patient Vitals for the past 24 hrs:  BP Temp Temp src Pulse Resp SpO2 Weight  02/11/15 0601 123/74 mmHg 98.7 F (37.1 C) Oral 63 18 97 % -  02/11/15 0500 - - - - - - 223 lb 15.8 oz (101.6 kg)  02/10/15 1539 (!) 105/49 mmHg 99.3 F (37.4 C) Oral 90 18 95 % -  02/10/15 1255 (!) 125/96 mmHg - - 83 - 98 % -   Current Weight  02/11/15 223 lb 15.8 oz (101.6 kg)     Intake/Output from previous day: 06/11 0701 - 06/12 0700 In: 720 [P.O.:720] Out: -     PHYSICAL EXAM:  Heart: Irr irr Lungs: Decreased BS in bases Wound: R chest wound healing well.  R groin wound with superficial separation at the proximal edge, no drainage, purulence or erythema Extremities: +Le edema    Lab Results: CBC: Recent Labs  02/09/15 0507  WBC 9.6  HGB 10.7*  HCT 36.0*  PLT 270   BMET:  Recent Labs  02/09/15 0507  NA 138  K 3.6  CL 100*  CO2 32  GLUCOSE 94  BUN 15  CREATININE 0.79  CALCIUM 8.3*    PT/INR:  Recent Labs  02/11/15 0510  LABPROT 33.2*  INR 3.34*      Assessment/Plan: Continue rehab. Groin wound stable.  Continue local wound care.   LOS: 3 days    Lance Cook H 02/11/2015

## 2015-02-11 NOTE — Progress Notes (Signed)
Physical Therapy Session Note  Patient Details  Name: Lance Cook MRN: [id number] Date of Birth: April 21, 1961  Today's Date: 02/11/2015 PT Individual Time: 0830-0900 PT Individual Time Calculation (min): 30 min   Short Term Goals: Week 1:  PT Short Term Goal 1 (Week 1): STG=LTG due to ELOS  Skilled Therapeutic Interventions/Progress Updates:   Pt benefits from cues for safety during standing tasks in session. Pt still limited by pain at points. Pt would continue to benefit from skilled PT services to increase functional mobility.  Therapy Documentation Precautions:  Precautions Precautions: Fall Restrictions Weight Bearing Restrictions: No Vital Signs: Therapy Vitals Temp: 98.7 F (37.1 C) Temp Source: Oral Pulse Rate: 63 Resp: 18 BP: 123/74 mmHg Patient Position (if appropriate): Sitting Oxygen Therapy SpO2: 97 % O2 Device: Not Delivered Pain: Pain Assessment Pain Assessment: 0-10 Pain Score: 3  Pain Location: Back Pain Orientation: Lower Mobility:  Min guard assist transfers with cues for safety and attention Locomotion :   Min A 25' with cues for sequencing, safety, and attention Other Treatments:  Pt educated on rehab plan and safety in mobility. Pt performs static and dynamic sitting and standing balance with dual motor tasks including weight shifting, reaching, grasping, and LE manipulation within functional context.   See FIM for current functional status  Therapy/Group: Individual Therapy  [name] 02/11/2015, 9:56 AM

## 2015-02-11 NOTE — Progress Notes (Signed)
Patient Home CPAP being used and he placed himself on Nasal Mask from home. Added Sterile Water to chamber and that is all. Patient tolerating well and is able to remove and place [facility] on himself.

## 2015-02-11 NOTE — Progress Notes (Signed)
RT asked patient if he was ready to apply his home CPAP. Patient stated in front of spouse that he didn't need help applying the mask just needed to have someone setup for him to be able to turn on when he gets in bed. RT added sterile water to his home CPAP chamber and put the mask connected to circuit and CPAP machine in order for it to be accessible for patient. RT also moved the bedside table with CPAP closer to the bed and had patient check that he could reach the power button. RT advised that if he needed anything else to notify respiratory. RT made RN aware.

## 2015-02-11 NOTE — Progress Notes (Signed)
ANTICOAGULATION CONSULT NOTE - Follow Up Consult  Pharmacy Consult for Coumadin Indication: mechanical heart valves  Allergies  Allergen Reactions  . Penicillins     REACTION: Hives  . Adhesive [Tape] Other (See Comments)    Shin irritation    Patient Measurements: Weight: 223 lb 15.8 oz (101.6 kg) Heparin Dosing Weight:    Vital Signs: Temp: 98.7 F (37.1 C) (06/12 0601) Temp Source: Oral (06/12 0601) BP: 123/74 mmHg (06/12 0601) Pulse Rate: 63 (06/12 0601)  Labs:  Recent Labs  02/09/15 0507 02/10/15 0453 02/11/15 0510  HGB 10.7*  --   --   HCT 36.0*  --   --   PLT 270  --   --   LABPROT 32.5* 32.1* 33.2*  INR 3.25* 3.19* 3.34*  CREATININE 0.79  --   --     Estimated Creatinine Clearance: 108.8 mL/min (by C-G formula based on Cr of 0.79).   Assessment: 31 YOM with history of Afib and recent history of MVR on 01/24/15 to continue on Coumadin. Transferred to Rehab 6/9.  AC: s/p mech MVR 5/25. Restarted warf on 5/27 -(MD) PTA for afib and mech AVR (started on flagyl on 5/31, watch INR closely) (avoiding hep/lovenox with concerns for HIT 5/29 -- NEG); no reported significant s/s bleeding. INR 3.34 remains in goal. --Coumadin PTA: 5mg /day except 7.5mg  on Wed (stopped 5/17), was also on lovenox PTA. INR 1.5 at last clinic visit GOAL: 2.5-3.5  Goal of Therapy:  INR 2.5-3.5 Monitor platelets by anticoagulation protocol: Yes   Plan:  Coumadin 5mg  po x 1 again tonight Daily INR.  Shabria Egley S. Alford Highland, PharmD, BCPS Clinical Staff Pharmacist Pager [phone]  [place], [place] 02/11/2015,9:13 AM

## 2015-02-11 NOTE — Progress Notes (Signed)
Physical Therapy Session Note  Patient Details  Name: [name] MRN: [id number] Date of Birth: 1960/11/07  Today's Date: 02/11/2015 PT Individual Time: 1630-1700 PT Individual Time Calculation (min): 30 min   Short Term Goals: Week 1:  PT Short Term Goal 1 (Week 1): STG=LTG due to ELOS  Skilled Therapeutic Interventions/Progress Updates:   Session focused on functional ambulation, standing balance, stair negotiation, activity tolerance, and safety awareness. Gait using RW x 170 ft with one standing rest break + 75 ft with supervision-min guard, verbal cues for safe turns using RW and safe hand placement with sit <> stand. Stair training using R rail ascending to simulate environment, twelve 6" steps with supervision-min guard, patient self-electing to ascend with reciprocal pattern and descend with step-to pattern. Patient ambulated without AD x 100 ft + 135 ft with min guard to return to room. Patient left sitting in recliner with needs within reach, NT present and wife in room.    Therapy Documentation Precautions:  Precautions Precautions: Fall Restrictions Weight Bearing Restrictions: No Pain: Pain Assessment Pain Assessment: 0-10 Pain Score: 4  Pain Type: Chronic pain Pain Location: Back Pain Orientation: Lower Pain Descriptors / Indicators: Aching Pain Onset: On-going Pain Intervention(s): Emotional support;Ambulation/increased activity  See FIM for current functional status  Therapy/Group: Individual Therapy  Laretta Alstrom 02/11/2015, 4:45 PM

## 2015-02-11 NOTE — Progress Notes (Signed)
Social Work Social Work Assessment and Plan  Patient Details  Name: Lance Cook MRN: [id number] Date of Birth: 1960/11/16  Today's Date: 02/10/2015  Problem List:  Patient Active Problem List   Diagnosis Date Noted  . Hypomagnesemia 02/08/2015  . Physical deconditioning 02/08/2015  . Encephalopathy acute 02/05/2015  . Hypokalemia 02/05/2015  . Enteritis due to Clostridium difficile 01/30/2015  . HCAP (healthcare-associated pneumonia) 01/29/2015  . Acute respiratory failure with hypoxemia   . ARDS (adult respiratory distress syndrome) 01/27/2015  . 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 29/52/8413  . Chronic diastolic congestive heart failure   . Atelectasis   . History of mechanical aortic valve replacement 12/25/2014  . 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 Severe mitral regurgitation 10/11/2014  . Cellulitis 04/02/2014  . Hypotension 04/02/2014  . Venous insufficiency of both lower extremities 04/02/2014  . Morbid obesity 04/02/2014  . Encounter for therapeutic drug monitoring 02/02/2014  . Mass of left side of neck 08/18/2012  . Head or neck swelling, mass, or lump 07/15/2012  . Long term (current) use of anticoagulants 07/15/2012  . Hypogonadism male 04/08/2012  . History of adenomatous polyp of colon 12/23/2011  . Testicular cancer   . TIA (transient ischemic attack)   . OSA (obstructive sleep apnea)   . Lumbar spinal stenosis 10/08/2011  . Chronic LBP 10/08/2011  . CVA (cerebral vascular accident) 10/08/2011  . Erectile dysfunction 10/08/2011  . Preventative health care 08/09/2011  . CAD (coronary artery disease)   . Varicose veins 05/01/2011  . SPINAL STENOSIS, LUMBAR 10/01/2010  . ULCER, LEG 04/29/2010  . Osteoarthritis of spine 04/29/2010  . DYSLIPIDEMIA 01/15/2010  . Chronic venous hypertension with ulcer 01/15/2010  . Essential hypertension 12/14/2009  . MUSCLE CRAMPS 12/14/2009  . LEG  PAIN, LEFT 11/29/2009  . DIABETES MELLITUS, TYPE II 11/01/2009  . GOUT 11/01/2009  . Iron deficiency anemia 11/01/2009  . MYOCARDIAL INFARCTION, HX OF 11/01/2009  . ATRIAL FIBRILLATION, CHRONIC 11/01/2009  . INTERNAL HEMORRHOIDS 11/01/2009  . PERIPHERAL EDEMA 11/01/2009  . H/O mechanical aortic valve replacement 11/01/2009  . Ascending aortic dissection 07/14/2008   Past Medical History:  Past Medical History  Diagnosis Date  . Hyperlipidemia   . Hypertension   . Arthritis   . Leg pain 06/28/2010  . Hiatal hernia   . Atrial fibrillation     chronic persistent  . Reflux 11/06/09  . Myocardial infarction age 27  . Varicose veins   . Obesity   . Peripheral vascular disease   . Gout   . CAD (coronary artery disease)     Old scar inferior wall myoview, 10/2009 EF 52%  . Chronic LBP 10/08/2011  . Impaired glucose tolerance 10/08/2011  . Erectile dysfunction 10/08/2011  . Testicular cancer   . Bell's palsy   . TIA (transient ischemic attack)     age 28  . CVA (cerebral infarction)     54yo  . OSA (obstructive sleep apnea)     CPAP  . History of colon polyps   . Anemia   . Hypogonadism male 04/08/2012  . Aortic aneurysm   . Aortic aneurysm and dissection   . 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     1988 - Dr Blase Mess at [facility] in [place], [place]  . Ascending aortic dissection 07/14/2008    Localized dissection of ascending aorta noted on CTA in 2009 and stable on CTA  in 2011  . Severe mitral regurgitation 10/11/2014  . Morbid obesity 04/02/2014  . Diabetes mellitus     diet controlled  . DIABETES MELLITUS, TYPE II 11/01/2009    Qualifier: Diagnosis of  By: [name] MD, [name]    . Dysrhythmia   . CHF (congestive heart failure)   . Shortness of breath dyspnea     with exertion  . Anxiety   . GERD (gastroesophageal reflux disease)     not needing medication at thhis time- 01/22/15  . Stroke     TIA and Stroke no residual  effect- 1st was 82  . Chronic diastolic congestive heart failure   . 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 02/15/736    33 mm St Jude bileaflet mechanical valve placed via right mini thoracotomy approach   Past Surgical History:  Past Surgical History  Procedure Laterality Date  . Laser ablation  03/06/2010    left leg  . Cardiac valve replacement      [id number]  . Cholecystectomy  2011  . Tonsillectomy  1967  . Otoplasty      bilateral, age 40  . Orchiectomy  age 72    testicular cancer  . Tee without cardioversion N/A 10/11/2014    Procedure: TRANSESOPHAGEAL ECHOCARDIOGRAM (TEE);  Surgeon: Pixie Casino, MD;  Location: [facility] ENDOSCOPY;  Service: Cardiovascular;  Laterality: N/ADeneen Harts procedure  1988    25 mm St Jude mechanical valve conduit - Dr Blase Mess at [facility] in [place], [place]  . Coronary artery bypass graft    . Aortic truck    . Tee without cardioversion N/A 01/24/2015    Procedure: TRANSESOPHAGEAL ECHOCARDIOGRAM (TEE);  Surgeon: Rexene Alberts, MD;  Location: [place];  Service: Open Heart Surgery;  Laterality: N/A;  . Mitral valve replacement Right 01/24/2015    Procedure: Re-Operation, MINIMALLY INVASIVE MITRAL VALVE (MV) REPLACEMENT.;  Surgeon: Rexene Alberts, MD;  Location: [place];  Service: Open Heart Surgery;  Laterality: Right;   Social History:  reports that he has been smoking Cigars.  He has never used smokeless tobacco. He reports that he drinks alcohol. He reports that he does not use illicit drugs.  Family / Support Systems Marital Status: Married Patient Roles: Spouse (pt provides some caregiver support to wife) Spouse/Significant Other: wife, Riely Baskett @ [phone] Children: none Anticipated Caregiver: wife Ability/Limitations of Caregiver: wife legally blind  Caregiver Availability: 24/7 Family Dynamics: pt and wife appear supportive of one another, however, both make constant comments about the  frustrations of the general public.  Social History Preferred language: English Religion: Christian Cultural Background: NA Education: college Read: Yes Write: Yes Employment Status: Disabled Date Retired/Disabled/Unemployed: 3 yrs Freight forwarder Issues: None Guardian/Conservator: None - per MD, pt capable of making decisions on his own behalf   Abuse/Neglect Physical Abuse: Denies Verbal Abuse: Denies Sexual Abuse: Denies Exploitation of patient/patient's resources: Denies Self-Neglect: Denies  Emotional Status Pt's affect, behavior adn adjustment status: Pt very talkative and speaks openly about his frustrations with his current physical limitations.  At times, however, he becomes tangential about his general frustrations with societal issues.  Wife joins in to his complaining and I need to redirect to interveiw at hand.  Pt denies any s/s of depression or anxiety.  Reports he is pleased with current progress and is relying heavily on his "born again [place]" to cope.   Recent Psychosocial Issues: Pt with lengthy medical problem list and wife  notes that "the past few years have been terrible for [place]..."both financially and socially...with him not being able to drive it has made things really hard to get things done." Pyschiatric History: None Substance Abuse History: None  Patient / Family Perceptions, Expectations & Goals Pt/Family understanding of illness & functional limitations: Pt and wife with general understanding of his current functional limitations due to MVR/ need for CIR. Premorbid pt/family roles/activities: Pt and wife are independent in their home, however, need assistance with transportation Anticipated changes in roles/activities/participation: Little change anticipated if able to reach supervision/ mod i goals Pt/family expectations/goals: Pt hopes to be able to return home at independent and feels confident he will be able to resume  driving.  Community Resources [employer]: None Premorbid Home Care/DME Agencies: None Transportation available at discharge: from friends  Discharge Planning Living Arrangements: Spouse/significant other Support Systems: Spouse/significant other, Friends/neighbors Type of Residence: Private residence Insurance underwriter Resources: [employer] ([place] Medicare) Museum/gallery curator Resources: [employer] Financial Screen Referred: No Living Expenses: Higher education careers adviser Management: Patient Does the patient have any problems obtaining your medications?: No Home Management: pt and wife Patient/Family Preliminary Plans: pt plans to return home with his wife Social Work Anticipated Follow Up Needs: HH/OP Expected length of stay: ELOS 7-10 days  Clinical Impression Gentleman here on CIR for deconditioning and encephalopathy following MVR.  Wife at bedside and injects her opinions throughout my assessment interview.  Both pt and wife appear realistic about his current functional limitations and support needed at d/c.  Pt denies any s/s of significant emotional distress.  They report frustrations with what they see as "...abuse of the system" in regards to community resources that they "should be eligible for, too."  Discussion requires frequent redirection back to assessment questions.  Will continue to follow for support and d/c planning needs. Gwendalyn Mcgonagle 02/10/2015, 12:37 PM

## 2015-02-12 ENCOUNTER — Inpatient Hospital Stay (HOSPITAL_COMMUNITY): Payer: Self-pay | Admitting: Occupational Therapy

## 2015-02-12 ENCOUNTER — Inpatient Hospital Stay (HOSPITAL_COMMUNITY): Payer: Medicare HMO | Admitting: Speech Pathology

## 2015-02-12 ENCOUNTER — Inpatient Hospital Stay (HOSPITAL_COMMUNITY): Payer: Medicare HMO

## 2015-02-12 DIAGNOSIS — I5033 Acute on chronic diastolic (congestive) heart failure: Secondary | ICD-10-CM

## 2015-02-12 DIAGNOSIS — I890 Lymphedema, not elsewhere classified: Secondary | ICD-10-CM

## 2015-02-12 DIAGNOSIS — R600 Localized edema: Secondary | ICD-10-CM

## 2015-02-12 DIAGNOSIS — R609 Edema, unspecified: Secondary | ICD-10-CM

## 2015-02-12 LAB — BASIC METABOLIC PANEL
Anion gap: 8 (ref 5–15)
BUN: 16 mg/dL (ref 6–20)
CHLORIDE: 98 mmol/L — AB (ref 101–111)
CO2: 30 mmol/L (ref 22–32)
Calcium: 8.1 mg/dL — ABNORMAL LOW (ref 8.9–10.3)
Creatinine, Ser: 0.87 mg/dL (ref 0.61–1.24)
GFR calc non Af Amer: >60 mL/min (ref >60)
Glucose, Bld: 87 mg/dL (ref 65–99)
POTASSIUM: 3.6 mmol/L (ref 3.5–5.1)
SODIUM: 136 mmol/L (ref 135–145)

## 2015-02-12 LAB — CBC
HCT: 34.7 % — ABNORMAL LOW (ref 39.0–52.0)
Hemoglobin: 10.6 g/dL — ABNORMAL LOW (ref 13.0–17.0)
MCH: 26 pg (ref 26.0–34.0)
MCHC: 30.5 g/dL (ref 30.0–36.0)
MCV: 85.3 fL (ref 78.0–100.0)
PLATELETS: 248 10*3/uL (ref 150–400)
RBC: 4.07 MIL/uL — AB (ref 4.22–5.81)
RDW: 18.9 % — ABNORMAL HIGH (ref 11.5–15.5)
WBC: 6.8 10*3/uL (ref 4.0–10.5)

## 2015-02-12 LAB — GLUCOSE, CAPILLARY
GLUCOSE-CAPILLARY: 70 mg/dL (ref 65–99)
Glucose-Capillary: 70 mg/dL (ref 65–99)
Glucose-Capillary: 104 mg/dL — ABNORMAL HIGH (ref 65–99)
Glucose-Capillary: 79 mg/dL (ref 65–99)

## 2015-02-12 LAB — PROTIME-INR
INR: 2.84 — ABNORMAL HIGH (ref 0.00–1.49)
Prothrombin Time: 29.3 seconds — ABNORMAL HIGH (ref 11.6–15.2)

## 2015-02-12 MED ORDER — WARFARIN SODIUM 5 MG PO TABS
5.0000 mg | ORAL_TABLET | Freq: Once | ORAL | Status: AC
  Administered 2015-02-12: 5 mg via ORAL
  Filled 2015-02-12: qty 1

## 2015-02-12 MED ORDER — FUROSEMIDE 40 MG PO TABS
40.0000 mg | ORAL_TABLET | Freq: Three times a day (TID) | ORAL | Status: DC
  Administered 2015-02-12 – 2015-02-13 (×3): 40 mg via ORAL
  Filled 2015-02-12 (×5): qty 1

## 2015-02-12 NOTE — Progress Notes (Signed)
Physical Therapy Session Note  Patient Details  Name: Lance Cook MRN: [id number] Date of Birth: 02-09-1961  Today's Date: 02/12/2015 PT Individual Time: 1403-1501 PT Individual Time Calculation (min): 58 min   Short Term Goals: Week 1:  PT Short Term Goal 1 (Week 1): STG=LTG due to ELOS  Skilled Therapeutic Interventions/Progress Updates:   Session focused on functional gait on unit and through dynamic obstacle course without AD to progress patient to prior level of mobility (S level overall for safety and endurance), overall endurance and activity tolerance throughout session with emphasis on pt self monitoring, stair negotiation for home and community mobility training, education and introduction with recreational therapist for community outing and goals that would be addressed if able to make possible in schedule, balance activity to pick up items off of floor and carry to various locations in the room to simulate household functional task, d/c planning in regards to household mobility and follow up recommendations, and simulated car transfer to prepare for discharge. Pt overall S level with gait and transfers during session though limited due to decreased activity tolerance. Pt requires multiple seated rest breaks throughout session to recover from activities. Discussed energy conservation techniques and benefits of community re-integration outing to which pt in agreement. End of session left up in recliner with all needs in reach.   Therapy Documentation Precautions:  Precautions Precautions: Fall Restrictions Weight Bearing Restrictions: No  Pain:  Denies pain.  See FIM for current functional status  Therapy/Group: Individual Therapy and Co-Treatment with TR  Canary Brim Ivory Broad, PT, DPT  02/12/2015, 3:14 PM

## 2015-02-12 NOTE — Progress Notes (Signed)
Initial Nutrition Assessment  DOCUMENTATION CODES:  Morbid obesity  INTERVENTION:  Snacks, Prostat   Encourage adequate PO intake.   NUTRITION DIAGNOSIS:  Inadequate oral intake related to  (decreased appetite) as evidenced by  (varied meal completion 30-100%).   GOAL:  Patient will meet greater than or equal to 90% of their needs  MONITOR:  PO intake, Weight trends, Labs, I & O's  REASON FOR ASSESSMENT:  Consult  (hypoglycemia)  ASSESSMENT: Pt with history of DM, CAF, PVD, TIA's, CAD s/p CAD with AVR '99, severe MR who was admitted for minimally invasive MVR on 01/24/15. Post op with hypoxemia due to edema v/s ARDS and required prolonged vent support. Extubated 6/3. Patient noted to be severely deconditioned and admitted to CIR.  Pt reports his appetite is just "ok". Meal completion has been 30-100%. PTA pt reports having a good appetite with consumption of 3 meals a day with no other difficulties. Pt reports since his surgery, pt has had weight loss. Per Epic weight records, pt with a 7% weight loss in 1 month. RD was additionally consulted regarding pt's hypoglycemia and thus wanted needed recommendations for better blood sugar control. Pt was encouraged to eat his food at meals (especially his carbs and protein at meals) to present hypoglycemia. Pt was also given a handout "Carbohydrate count for people with diabetes" to review. Pt was offered Glucerna Shake, however declined. Pt is agreeable to nourishment snacks (fruit cup, yogurt). RD to order. Of note, pt currently has Prostat ordered. Will continue with orders.   Pt with no observed significant fat or muscle mass loss.   Labs and medications reviewed.   Height:  Ht Readings from Last 1 Encounters:  01/24/15 5\' 1"  (1.549 m)    Weight:  Wt Readings from Last 1 Encounters:  02/12/15 220 lb 9.6 oz (100.064 kg)    Ideal Body Weight:  51 kg  Wt Readings from Last 10 Encounters:  02/12/15 220 lb 9.6 oz (100.064  kg)  02/08/15 231 lb 1.6 oz (104.826 kg)  01/22/15 244 lb 12.8 oz (111.041 kg)  01/16/15 237 lb (107.502 kg)  12/25/14 237 lb (107.502 kg)  12/18/14 240 lb (108.863 kg)  11/13/14 244 lb (110.678 kg)  10/06/14 242 lb 14.4 oz (110.179 kg)  05/23/14 230 lb (104.327 kg)  04/14/14 231 lb (104.781 kg)    BMI:  Body mass index is 41.7 kg/(m^2). Morbid obesity  Estimated Nutritional Needs:  Kcal:  2000-2300  Protein:  120-130 grams  Fluid:  Per MD  Skin:   Incision on R chest, wound on R groin, wound on upper buttocks, +1 UE, +3 LE edema  Diet Order:  Diet Heart Room service appropriate?: Yes; Fluid consistency:: Thin  EDUCATION NEEDS:  Education needs addressed   Intake/Output Summary (Last 24 hours) at 02/12/15 1425 Last data filed at 02/12/15 1200  Gross per 24 hour  Intake   1560 ml  Output   2350 ml  Net   -790 ml    Last BM:  6/12  Corrin Parker, MS, RD, LDN Pager # [phone] After hours/ weekend pager # [phone]

## 2015-02-12 NOTE — Progress Notes (Signed)
Occupational Therapy Session Note  Patient Details  Name: Lance Cook MRN: [id number] Date of Birth: December 25, 1960  Today's Date: 02/12/2015 OT Individual Time: 9390-3009 OT Individual Time Calculation (min): 75 min    Short Term Goals: Week 1:  OT Short Term Goal 1 (Week 1): STG=LTG  Skilled Therapeutic Interventions/Progress Updates:    Pt seen for OT ADL bathing and dressing session. Pt in recliner upon arrival, agreeable to tx. Pt ambulated throughout room and into/out of shower with close supervision. Pt completed bathing task seated on tub transfer bench with supervision, standing to complete buttock hygiene while holding onto grab bars. Pt ambulated out of bathroom with supervision, and dressing seated in w/c at the sink. Pt educated and provided with sock aid, as pt required assist to don socks PTA. Pt required min A to straighten socks with use of reacher. Pt able to don B shoes in standing, assist required to tie. Pt educated on use of elastic shoe laces, however, pt with increased edema in B LEs, and elastic shoe laces not feasible at this time, will provide when appropriate. ACE wraps re-applied on B LEs following showering tasks. Pt completed standing grooming task at the sink, however, required increased rest breaks following showering task. Pt then ambulated to therapy gym pushing w/c with VCs for safety, and required seated rest break during functional ambulation. In therapy gym, pt completed step ups on ~3 inch step in order to increase strength instability in prep for functional transfer into step in shower. Pt required HHA to complete, tolerating x5 leading with R LE. Pt tolerated one step up leading with L LE before requesting seated rest break. Following rest break, pt ambulated back to room with supervision, declining pushing w/c and using wall hand rails to assist. Pt returned to recliner at end of session, left with all needs in reach and wife present.  Pt educated regarding use of  sock aid, elastic shoe laces, energy conservation, need for assist, importance of participation with ADLs and d/c planning.    Therapy Documentation Precautions:  Precautions Precautions: Fall Restrictions Weight Bearing Restrictions: No Pain: Pain Assessment Pain Score: 5  Pain Location: Back Pain Descriptors / Indicators: Aching Pain Intervention(s): RN made aware;Ambulation/increased activity ADL: ADL ADL Comments: see FIM  See FIM for current functional status  Therapy/Group: Individual Therapy  [name], Ashtin Rosner C 02/12/2015, 7:08 AM

## 2015-02-12 NOTE — Progress Notes (Signed)
Speech Language Pathology Daily Session Note  Patient Details  Name: Lance Cook MRN: [id number] Date of Birth: Aug 20, 1961  Today's Date: 02/12/2015 SLP Individual Time: 0800-0900 SLP Individual Time Calculation (min): 60 min  Short Term Goals: Week 1: SLP Short Term Goal 1 (Week 1): Patient will demonstrate selective attention to a task in a minimally disctracting enviornment for 45 minutes with Supervision verbal cues for redireciton.  SLP Short Term Goal 2 (Week 1): Patient will demonstrate functional problem solving for basic and familiar tasks with supervision verbal cues.  SLP Short Term Goal 3 (Week 1): Patient will utilize external aids to recall new, daily information with supervision verbal cues.  SLP Short Term Goal 4 (Week 1): Patient will self-monitor and correct errors during functional tasks with supervision question and verbal cues.   Skilled Therapeutic Interventions: Skilled treatment session focused on cognitive goals. SLP facilitated session by facilitating a medication management task. Patient independently recalled of his current medications, dosages and functions with Mod I. Patient also verbally described his current medication management system he is using at home; suspect he will be independent with his "system" at discharge. Patient also participated in a mildly complex money management task with extra time and supervision verbal cues for recall of information, however, patient was Mod I for problem solving with task. Patient left upright in recliner with all needs within reach. Continue with current plan of care.    FIM:  Comprehension Comprehension: 5-Follows basic conversation/direction: With extra time/assistive device Expression Expression: 5-Expresses basic 90% of the time/requires cueing < 10% of the time. Social Interaction Social Interaction: 5-Interacts appropriately 90% of the time - Needs monitoring or encouragement for participation or  interaction. Problem Solving Problem Solving: 5-Solves basic problems: With no assist Memory Memory: 5-Requires cues to use assistive device  Pain Pain Assessment Pain Assessment: No/denies pain  Therapy/Group: Individual Therapy  Kavita Bartl 02/12/2015, 3:45 PM

## 2015-02-12 NOTE — Consult Note (Signed)
CARDIOLOGY CONSULT NOTE   Patient ID: Lance Cook MRN: [id number], DOB/AGE: [birth year]   Admit date: 02/08/2015 Date of Consult: 02/12/2015  Primary Physician: [name], PA-C Primary Cardiologist: Dr [name]  Reason for consult:  LE edema  Problem List  Past Medical History  Diagnosis Date  . Hyperlipidemia   . Hypertension   . Arthritis   . Leg pain 06/28/2010  . Hiatal hernia   . Atrial fibrillation     chronic persistent  . Reflux 11/06/09  . Myocardial infarction age 59  . Varicose veins   . Obesity   . Peripheral vascular disease   . Gout   . CAD (coronary artery disease)     Old scar inferior wall myoview, 10/2009 EF 52%  . Chronic LBP 10/08/2011  . Impaired glucose tolerance 10/08/2011  . Erectile dysfunction 10/08/2011  . Testicular cancer   . Bell's palsy   . TIA (transient ischemic attack)     age 49  . CVA (cerebral infarction)     54yo  . OSA (obstructive sleep apnea)     CPAP  . History of colon polyps   . Anemia   . Hypogonadism male 04/08/2012  . Aortic aneurysm   . Aortic aneurysm and dissection   . 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 Ascending aortic dissection 07/14/2008    Localized dissection of ascending aorta noted on CTA in 2009 and stable on CTA in 2011  . Severe mitral regurgitation 10/11/2014  . Morbid obesity 04/02/2014  . Diabetes mellitus     diet controlled  . DIABETES MELLITUS, TYPE II 11/01/2009    Qualifier: Diagnosis of  By: [name] MD, [name]    . Dysrhythmia   . CHF (congestive heart failure)   . Shortness of breath dyspnea     with exertion  . Anxiety   . GERD (gastroesophageal reflux disease)     not needing medication at thhis time- 01/22/15  . Stroke     TIA and Stroke no residual effect- 1st was 76  . Chronic diastolic congestive heart failure   . S/P  minimally invasive mitral valve replacement with  metallic valve 12/08/8117    33 mm St Jude bileaflet mechanical valve placed via right mini thoracotomy approach    Past Surgical History  Procedure Laterality Date  . Laser ablation  03/06/2010    left leg  . Cardiac valve replacement      [id number]  . Cholecystectomy  2011  . Tonsillectomy  1967  . Otoplasty      bilateral, age 61  . Orchiectomy  age 85    testicular cancer  . Tee without cardioversion N/A 10/11/2014    Procedure: TRANSESOPHAGEAL ECHOCARDIOGRAM (TEE);  Surgeon: Pixie Casino, MD;  Location: [facility] ENDOSCOPY;  Service: Cardiovascular;  Laterality: N/ADeneen Harts procedure  1988    25 mm St Jude mechanical valve conduit - Dr Blase Mess at [facility] in [place], [place]  . Coronary artery bypass graft    . Aortic truck    . Tee without cardioversion N/A 01/24/2015    Procedure: TRANSESOPHAGEAL ECHOCARDIOGRAM (TEE);  Surgeon: Rexene Alberts, MD;  Location: [facility];  Service: Open Heart Surgery;  Laterality: N/A;  . Mitral valve replacement Right 01/24/2015    Procedure: Re-Operation, MINIMALLY INVASIVE MITRAL VALVE (MV) REPLACEMENT.;  Surgeon: Rexene Alberts, MD;  Location:  [facility] OR;  Service: Open Heart Surgery;  Laterality: Right;    Allergies  Allergies  Allergen Reactions  . Penicillins     REACTION: Hives  . Adhesive [Tape] Other (See Comments)    Shin irritation    HPI   A very pleasant 54 year old male with PMH of obesity, chronic diastolic CHF, s/p Bentall procedure in 1988, s/p MI, CABG and most recently s/p mitral valve replacement via minimally invasive surgery on 01/23/2014. The patient has h/o chronic lymphedema and uses a Flextouch machine at home with excellent results. Post MVR his LE improved significantly, but then got worse in the last few days. His lasix dose was increased with some improvement. The patient is currently in inpatient rehab, spending most of his days in recliner/bed but walking with the PT 2-3 time per day. He denies chest pain or  SOB.  Inpatient Medications  . allopurinol  300 mg Oral Daily  . aspirin EC  81 mg Oral Daily  . atenolol  50 mg Oral BID  . busPIRone  7.5 mg Oral BID  . cholestyramine light  4 g Oral Q0600  . collagenase   Topical Daily  . digoxin  0.25 mg Oral Daily  . feeding supplement (PRO-STAT SUGAR FREE 64)  30 mL Oral BID  . furosemide  60 mg Oral BID  . insulin aspart  0-20 Units Subcutaneous TID WC  . metroNIDAZOLE  500 mg Oral Q8H  . potassium chloride  20 mEq Oral BID  . risperiDONE  0.25 mg Oral BID  . sodium chloride  10-40 mL Intracatheter Q12H  . temazepam  7.5 mg Oral QHS  . warfarin  5 mg Oral ONCE-1800  . Warfarin - Pharmacist Dosing Inpatient   Does not apply q1800   Family History Family History  Problem Relation Age of Onset  . Heart disease Mother   . Throat cancer Mother   . Diabetes Father   . Stroke Other   . Hypertension Other   . Cancer Maternal Aunt     lung, brain  . Cancer Maternal Uncle     brain aneurysm    Social History History   Social History  . Marital Status: Married    Spouse Name: N/A  . Number of Children: 0  . Years of Education: N/A   Occupational History  . Disabled Optician    Social History Main Topics  . Smoking status: Light Tobacco Smoker    Types: Cigars  . Smokeless tobacco: Never Used     Comment: about 3 yearly- cigar  . Alcohol Use: 0.0 oz/week    0 Standard drinks or equivalent per week     Comment: social  . Drug Use: No  . Sexual Activity: Not on file   Other Topics Concern  . Not on file   Social History Narrative    Review of Systems  General:  No chills, fever, night sweats or weight changes.  Cardiovascular:  No chest pain, dyspnea on exertion, edema, orthopnea, palpitations, paroxysmal nocturnal dyspnea. Dermatological: No rash, lesions/masses Respiratory: No cough, dyspnea Urologic: No hematuria, dysuria Abdominal:   No nausea, vomiting, diarrhea, bright red blood per rectum, melena, or  hematemesis Neurologic:  No visual changes, wkns, changes in mental status. All other systems reviewed and are otherwise negative except as noted above.  Physical Exam  Blood pressure 110/54, pulse 76, temperature 98.4 F (36.9 C), temperature source Oral, resp. rate 18, weight 220 lb 9.6 oz (100.064 kg), SpO2 99 %.  General: Pleasant, NAD Psych: Normal affect. Neuro: Alert and oriented X 3. Moves all extremities spontaneously. HEENT: Normal  Neck: Supple without bruits or JVD. Lungs:  Resp regular and unlabored, decreased breathing sounds at the right base with some rales at both bases. Heart: iRRR no s3, s4, click of mechanical valves, short diastolic murmur. Abdomen: Soft, non-tender, non-distended, BS + x 4.  Extremities: No clubbing, cyanosis or edema. DP/PT/Radials 2+ and equal bilaterally.  Labs  No results for input(s): CKTOTAL, CKMB, TROPONINI in the last 72 hours. Lab Results  Component Value Date   WBC 6.8 02/12/2015   HGB 10.6* 02/12/2015   HCT 34.7* 02/12/2015   MCV 85.3 02/12/2015   PLT 248 02/12/2015    Recent Labs Lab 02/09/15 0507 02/12/15 0552  NA 138 136  K 3.6 3.6  CL 100* 98*  CO2 32 30  BUN 15 16  CREATININE 0.79 0.87  CALCIUM 8.3* 8.1*  PROT 5.1*  --   BILITOT 0.6  --   ALKPHOS 40  --   ALT 16*  --   AST 21  --   GLUCOSE 94 87   Lab Results  Component Value Date   CHOL 118 08/18/2012   HDL 27.20* 08/18/2012   LDLCALC 64 08/18/2012   TRIG 133.0 08/18/2012   Radiology/Studies  Dg Chest 2 View  02/08/2015   CLINICAL DATA:  CHF, mitral valve replacement   IMPRESSION: Slight improved aeration of both lungs. Persistent mild interstitial edema bilaterally secondary to CHF. Subsegmental atelectasis in the right mid and lower lung with small effusion is present and stable.     Echocardiogram  - 10/11/2014  - Left ventricle: There was mild concentric hypertrophy. Systolic function was normal. The estimated ejection fraction was in  the range of 60% to 65%. Wall motion was normal; there were no regional wall motion abnormalities. - Aortic valve: Mechanical bileaflet valve - no obstruction. Trace to mild physiologic AI. - Mitral valve: There is a flail leaflet, likely to the P2 scallop of the posterior leaflet. This prolapses through the valve plane. There is moderate to severe posteriorly directed regurgitation from a central jet and a small eccentric jet is also noted. - Left atrium: Massively dilated - smoke is present. No evidence of thrombus in the atrial cavity or appendage. - Pulmonary veins: No anomaly- systolic flow reversal noted in the RUPV. - Right atrium: No evidence of thrombus in the atrial cavity or appendage. - Atrial septum: Bows from left to right - no PFO by color doppler. - Pulmonic valve: No evidence of vegetation.  Impressions: - Stable mechanical AVR without obstruction. Flail P2 cord with moderate to severe posteriorly directed MR, massive LAE, no LAA thrombus, significant LA smoke, normal LV function.   ECG: a-fib, LBBB     ASSESSMENT AND PLAN  54 year old male with h/o CAD, CABG, Bentall procedure, MVR on 01/24/15 with recurrent LE edema  1. Recurrent LE edema - there is component of chronic lymphedema that is chronic and acute on chronic diastolic CHF - some rales at right pleural effusion on physical exam  The patient is in fact 20 lbs lighter than on admission, however still fluid overloaded, I would continue higher doses of lasix however would break them into smaller doses as he states that he feels hypotensive after them. Switch to 40 mg po TID. I will order occupational therapy for lymphedema wrapping, the patient can't tolerate compression stockings and uses Flextouch machine at home.  2. Acute on  chronic diastolic CHF - I would order post surgical echocardiogram, Crea is stable   Signed, Dorothy Spark, MD, [facility] 02/12/2015, 1:38 PM  .knpro

## 2015-02-12 NOTE — Progress Notes (Signed)
[facility] PHYSICAL MEDICINE & REHABILITATION     PROGRESS NOTE    Subjective/Complaints: Pt concerned about low cbg's again. Feels that he can do a better job managing his LE edema at home with his own devices.   ROS: Pt denies fever, rash/itching, headache, blurred or double vision, nausea, vomiting, abdominal pain, diarrhea, chest pain, shortness of breath, palpitations, dysuria, dizziness, neck or back pain, bleeding, or depression  Objective: Vital Signs: Blood pressure 110/54, pulse 76, temperature 98.4 F (36.9 C), temperature source Oral, resp. rate 18, weight 100.064 kg (220 lb 9.6 oz), SpO2 99 %. No results found.  Recent Labs  02/12/15 0552  WBC 6.8  HGB 10.6*  HCT 34.7*  PLT 248    Recent Labs  02/12/15 0552  NA 136  K 3.6  CL 98*  GLUCOSE 87  BUN 16  CREATININE 0.87  CALCIUM 8.1*   CBG (last 3)   Recent Labs  02/11/15 1700 02/11/15 2048 02/12/15 0623  GLUCAP 73 103* 70    Wt Readings from Last 3 Encounters:  02/12/15 100.064 kg (220 lb 9.6 oz)  02/08/15 104.826 kg (231 lb 1.6 oz)  01/22/15 111.041 kg (244 lb 12.8 oz)    Physical Exam:  Constitutional: He is oriented to person, place, and time. He appears well-developed and well-nourished.  Morbidly obese male.  HENT:  Head: Normocephalic and atraumatic.  Eyes: Conjunctivae are normal. Pupils are equal, round, and reactive to light.  Neck: Normal range of motion. Neck supple.  Cardiovascular: Normal rate. An irregular rhythm is present  Positive valve click is present.  Respiratory: Effort normal and breath sounds normal. No respiratory distress. He has no wheezes or rales  GI: Soft. Bowel sounds are normal. There is no tenderness.  Musculoskeletal: Edema:    2+ edema bilateral LE edema, left greater than right, feet more than bilateral shins which are wrapped. Both legs with stasis dermatitis.  Neurological: He is alert and oriented to person, place, and time. Has basic insight  and awareness Motor strength is 4/5 bilateral deltoid, biceps, triceps, grip 3 minus bilateral hip flexor 4 minus bilateral knee extensor 4 bilateral ankle dorsiflexor plantar flexor Sensation intact to light touch in bilateral upper and lower limbs  Skin: Skin is warm and dry.  Dry  skin bilateral shins. Intergluteal ares with abraded areas due to MASD. several wounds/ abrasions along right chest wall. One suture remains in chest. Psych: patient a little anxious, distracted  Assessment/Plan: 1. Functional deficits secondary to debility/encephalopathy after MVR which require 3+ hours per day of interdisciplinary therapy in a comprehensive inpatient rehab setting. Physiatrist is providing close team supervision and 24 hour management of active medical problems listed below. Physiatrist and rehab team continue to assess barriers to discharge/monitor patient progress toward functional and medical goals. FIM: FIM - Bathing Bathing Steps Patient Completed: Chest, Right Arm, Left Arm, Abdomen, Front perineal area, Buttocks, Right upper leg, Left upper leg, Left lower leg (including foot), Right lower leg (including foot) Bathing: 4: Steadying assist  FIM - Upper Body Dressing/Undressing Upper body dressing/undressing steps patient completed: Thread/unthread right sleeve of pullover shirt/dresss, Thread/unthread left sleeve of pullover shirt/dress, Put head through opening of pull over shirt/dress, Pull shirt over trunk Upper body dressing/undressing: 5: Set-up assist to: Obtain clothing/put away FIM - Lower Body Dressing/Undressing Lower body dressing/undressing steps patient completed: Thread/unthread right underwear leg, Thread/unthread left underwear leg, Pull underwear up/down, Thread/unthread right pants leg, Thread/unthread left pants leg, Pull pants up/down Lower body dressing/undressing:  2: Max-Patient completed 25-49% of tasks  FIM - Toileting Toileting steps completed by patient:  Adjust clothing prior to toileting, Performs perineal hygiene, Adjust clothing after toileting Toileting: 5: Supervision: Safety issues/verbal cues (close SBA)  FIM - Air cabin crew Transfers Assistive Devices: Insurance account manager Transfers: 5-To toilet/BSC: Supervision (verbal cues/safety issues), 5-From toilet/BSC: Supervision (verbal cues/safety issues)  FIM - Control and instrumentation engineer Devices: Walker, Arm rests Bed/Chair Transfer: 4: Bed > Chair or W/C: Min A (steadying Pt. > 75%), 5: Chair or W/C > Bed: Supervision (verbal cues/safety issues)  FIM - Locomotion: Wheelchair Distance: 110 Locomotion: Wheelchair: 0: Activity did not occur FIM - Locomotion: Ambulation Locomotion: Ambulation Assistive Devices: Psychologist, occupational: Ambulation: 4: Travels 150 ft or more with minimal assistance (Pt.>75%)  Comprehension Comprehension: 5-Follows basic conversation/direction: With extra time/assistive device  Expression Expression: 5-Expresses basic 90% of the time/requires cueing < 10% of the time.  Social Interaction Social Interaction: 5-Interacts appropriately 90% of the time - Needs monitoring or encouragement for participation or interaction.  Problem Solving Problem Solving: 5-Solves basic problems: With no assist  Memory Memory: 6-More than reasonable amt of time Medical Problem List and Plan: 1. Functional deficits secondary to Deconditioning after mitral valve replacement 01/24/2015 in a patient with morbid obesity and postoperative encephalopathy 2. DVT Prophylaxis/Anticoagulation: Pharmaceutical: Coumadin per pharmacy--remains supratherapeutic 3. Chronic pain/ Pain Management: oxycodone and robaxin prn---doesn't appear to be in overt pain 4. Delirium/Mood: Resolving--Monitor with resumption of Buspar as well as restoril. LCSW to follow also  Has been emotionally stabile on rehab thus far 5. Neuropsych: This patient is capable of making  decisions on his own behalf. 6. MASD sacrum/Skin/Wound Care: Routine pressure relief measures. Santyl with daily dressing changes to sacral MASD.   -surgery following along 7. Fluids/Electrolytes/Nutrition: encourage PO. Labs reviewed and normal 8. Fluid overload with anasarca: Improving with diuresis. Continue to monitor daily weights. 9. Chronic diastolic CHF: checking daily weights. (100.6kg)   -increased lasix to 60mg  bid yesterday considering that edema and weight were trending up  -continue digoxin and atenolol.   -ACE wrap bilateral LE's/elevate  -asked cards to follow up with patient regarding ideal volume/meds 10 PAF: Monitor heart rate every 8 hours. Continue atenolol, digoxin and coumadin.  11. OSA: Continue to use CPAP daily. 12. C diff colitis: Metronidazole for 14 days (through 6/13). Questran works well 13. ABLA: hgb trending up 14. DM type 2: Diet controlled. Hgb A1C-5.9. sugars quite low.  i liberalized diet---don't need to check so frequently  -will ask RD to discuss diet/recs for better maintaining blood sugars 14. Anxiety disorder: Buspar resumed.  Will monitor mood and taper off Risperdal early next week if mood remains appropriate- he does seem a bit anxious 16. Chronic insomnia: Resumed Restoril. 17. Leucocytosis: Improving. No signs of infection.   18. Minimally invasive MVR: Monitor wound for healing. No need for sternal precautions.  19. Chronic peripheral edema: elevation and ACE wraps bilateral LE's as above   LOS (Days) 4 A FACE TO FACE EVALUATION WAS PERFORMED  SWARTZ,ZACHARY T 02/12/2015 8:37 AM

## 2015-02-12 NOTE — Progress Notes (Signed)
Pt CBG 70 Pt stated that he is good no symptoms of hypoglycemia. Will report to on coming RN. Arthor Captain LPN

## 2015-02-12 NOTE — Progress Notes (Signed)
Recreational Therapy Assessment and Plan  Patient Details  Name: Lance Cook MRN: [id number] Date of Birth: 1960-10-06 Today's Date: 02/12/2015  Rehab Potential: Good ELOS: 5 days   Assessment Clinical Impression:  Problem List:  Patient Active Problem List   Diagnosis Date Noted  . Hypomagnesemia 02/08/2015  . Physical deconditioning 02/08/2015  . Encephalopathy acute 02/05/2015  . Hypokalemia 02/05/2015  . Enteritis due to Clostridium difficile 01/30/2015  . HCAP (healthcare-associated pneumonia) 01/29/2015  . Acute respiratory failure with hypoxemia   . ARDS (adult respiratory distress syndrome) 01/27/2015  . S/P 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 64/40/3474  . Chronic diastolic congestive heart failure   . Atelectasis   . History of mechanical aortic valve replacement 12/25/2014  . 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 Severe mitral regurgitation 10/11/2014  . Cellulitis 04/02/2014  . Hypotension 04/02/2014  . Venous insufficiency of both lower extremities 04/02/2014  . Morbid obesity 04/02/2014  . Encounter for therapeutic drug monitoring 02/02/2014  . Mass of left side of neck 08/18/2012  . Head or neck swelling, mass, or lump 07/15/2012  . Long term (current) use of anticoagulants 07/15/2012  . Hypogonadism male 04/08/2012  . History of adenomatous polyp of colon 12/23/2011  . Testicular cancer   . TIA (transient ischemic attack)   . OSA (obstructive sleep apnea)   . Lumbar spinal stenosis 10/08/2011  . Chronic LBP 10/08/2011  . CVA (cerebral vascular accident) 10/08/2011  . Erectile dysfunction 10/08/2011  . Preventative health care 08/09/2011  . CAD (coronary artery disease)   . Varicose veins 05/01/2011  . SPINAL STENOSIS, LUMBAR 10/01/2010  . ULCER, LEG 04/29/2010  .  Osteoarthritis of spine 04/29/2010  . DYSLIPIDEMIA 01/15/2010  . Chronic venous hypertension with ulcer 01/15/2010  . Essential hypertension 12/14/2009  . MUSCLE CRAMPS 12/14/2009  . LEG PAIN, LEFT 11/29/2009  . DIABETES MELLITUS, TYPE II 11/01/2009  . GOUT 11/01/2009  . Iron deficiency anemia 11/01/2009  . MYOCARDIAL INFARCTION, HX OF 11/01/2009  . ATRIAL FIBRILLATION, CHRONIC 11/01/2009  . INTERNAL HEMORRHOIDS 11/01/2009  . PERIPHERAL EDEMA 11/01/2009  . H/O mechanical aortic valve replacement 11/01/2009  . Ascending aortic dissection 07/14/2008    Past Medical History:  Past Medical History  Diagnosis Date  . Hyperlipidemia   . Hypertension   . Arthritis   . Leg pain 06/28/2010  . Hiatal hernia   . Atrial fibrillation     chronic persistent  . Reflux 11/06/09  . Myocardial infarction age 51  . Varicose veins   . Obesity   . Peripheral vascular disease   . Gout   . CAD (coronary artery disease)     Old scar inferior wall myoview, 10/2009 EF 52%  . Chronic LBP 10/08/2011  . Impaired glucose tolerance 10/08/2011  . Erectile dysfunction 10/08/2011  . Testicular cancer   . Bell's palsy   . TIA (transient ischemic attack)     age 50  . CVA (cerebral infarction)     54yo  . OSA (obstructive sleep apnea)     CPAP  . History of colon polyps   . Anemia   . Hypogonadism male 04/08/2012  . Aortic aneurysm   . Aortic aneurysm and dissection   . 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 Ascending aortic dissection 07/14/2008    Localized dissection of ascending aorta  noted on CTA in 2009 and stable on CTA in 2011  . Severe mitral regurgitation 10/11/2014  . Morbid obesity 04/02/2014  . Diabetes mellitus     diet  controlled  . DIABETES MELLITUS, TYPE II 11/01/2009    Qualifier: Diagnosis of By: [name] MD, [name]   . Dysrhythmia   . CHF (congestive heart failure)   . Shortness of breath dyspnea     with exertion  . Anxiety   . GERD (gastroesophageal reflux disease)     not needing medication at thhis time- 01/22/15  . Stroke     TIA and Stroke no residual effect- 1st was 71  . Chronic diastolic congestive heart failure   . S/P 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 3/64/6803    33 mm St Jude bileaflet mechanical valve placed via right mini thoracotomy approach   Past Surgical History:  Past Surgical History  Procedure Laterality Date  . Laser ablation  03/06/2010    left leg  . Cardiac valve replacement      [id number]  . Cholecystectomy  2011  . Tonsillectomy  1967  . Otoplasty      bilateral, age 32  . Orchiectomy  age 69    testicular cancer  . Tee without cardioversion N/A 10/11/2014    Procedure: TRANSESOPHAGEAL ECHOCARDIOGRAM (TEE); Surgeon: Pixie Casino, MD; Location: [facility] ENDOSCOPY; Service: Cardiovascular; Laterality: N/ADeneen Harts procedure  1988    25 mm St Jude mechanical valve conduit - Dr Blase Mess at [facility] in [place], [place]  . Coronary artery bypass graft    . Aortic truck    . Tee without cardioversion N/A 01/24/2015    Procedure: TRANSESOPHAGEAL ECHOCARDIOGRAM (TEE); Surgeon: Rexene Alberts, MD; Location: [place]; Service: Open Heart Surgery; Laterality: N/A;  . Mitral valve replacement Right 01/24/2015    Procedure: Re-Operation, MINIMALLY INVASIVE MITRAL VALVE (MV) REPLACEMENT.; Surgeon: Rexene Alberts, MD; Location: [facility]; Service: Open Heart Surgery; Laterality: Right;    Assessment & Plan Clinical Impression: Patient is a 54 y.o. year old male history of CAF, PVD, TIA's, CAD s/p CAD with  AVR '99, severe MR who was admitted for minimally invasive MVR on 01/24/15 by Dr. Roxy Manns. Post op with hypoxemia due to edema v/s ARDS and required prolonged vent support. He was treated with aggressive diuresis as well as IV antibiotics for possible HCAP. He has had issues with mucous plugging as well as c diff colitis and was started on metronidazole on 05/31. He tolerated extubation with prn BIPAP use on 06/03. He has had worsening of ICU delirium with confusion and hallucinations 6/05 and Risperdal was added with improvement in mentation. Fluid overload is improving with diuresis and he's is being weaned off oxygen. Rectal tube d/c as diarrhea has resolved. A fib is rate controlled. Patient transferred to CIR on 02/08/2015.      Pt presents with decreased activity tolerance, decreased functional mobility Limiting pt's independence with leisure/community pursuits.   Leisure History/Participation Premorbid leisure interest/current participation: Medical laboratory scientific officer - Building control surveyor - Shopping mall Other Leisure Interests: Television Leisure Participation Style: With Family/Friends Awareness of Community Resources: Fair-identify 2 post discharge leisure resources Psychosocial / Spiritual Social interaction - Mood/Behavior: Cooperative Academic librarian Appropriate for Education?: Yes Patient Agreeable to [employer]?: Yes Recreational Therapy Orientation Orientation -Reviewed with patient: Available activity resources Strengths/Weaknesses Patient Strengths/Abilities: Willingness to participate Patient weaknesses: Physical limitations;Minimal Premorbid Leisure Activity TR Patient demonstrates impairments in the following area(s): Edema;Endurance;Skin Integrity  Plan Rec Therapy  Plan Is patient appropriate for Therapeutic Recreation?: Yes Rehab Potential: Good Treatment times per week: Min 1 time for community reintegration Estimated Length of Stay: 5 days TR Treatment/Interventions:  Adaptive equipment instruction;Balance/vestibular training;Functional mobility training;Community reintegration;Patient/family education;Therapeutic activities;Recreation/leisure participation;Therapeutic exercise  Recommendations for other services: None  Discharge Criteria: Patient will be discharged from TR if patient refuses treatment 3 consecutive times without medical reason.  If treatment goals not met, if there is a change in medical status, if patient makes no progress towards goals or if patient is discharged from hospital.  The above assessment, treatment plan, treatment alternatives and goals were discussed and mutually agreed upon: by patient  [place] 02/12/2015, 2:57 PM

## 2015-02-12 NOTE — Progress Notes (Signed)
ANTICOAGULATION CONSULT NOTE - Follow Up Consult  Pharmacy Consult for coumadin Indication: atrial fibrillation and mech AVR  Allergies  Allergen Reactions  . Penicillins     REACTION: Hives  . Adhesive [Tape] Other (See Comments)    Shin irritation    Patient Measurements: Weight: 220 lb 9.6 oz (100.064 kg) Heparin Dosing Weight:   Vital Signs: Temp: 98.4 F (36.9 C) (06/13 0555) Temp Source: Oral (06/13 0555) BP: 110/54 mmHg (06/13 0555) Pulse Rate: 74 (06/13 0555)  Labs:  Recent Labs  02/10/15 0453 02/11/15 0510 02/12/15 0552  HGB  --   --  10.6*  HCT  --   --  34.7*  PLT  --   --  248  LABPROT 32.1* 33.2* 29.3*  INR 3.19* 3.34* 2.84*  CREATININE  --   --  0.87    Estimated Creatinine Clearance: 99.2 mL/min (by C-G formula based on Cr of 0.87).   Medications:  Scheduled:  . allopurinol  300 mg Oral Daily  . aspirin EC  81 mg Oral Daily  . atenolol  50 mg Oral BID  . busPIRone  7.5 mg Oral BID  . cholestyramine light  4 g Oral Q0600  . collagenase   Topical Daily  . digoxin  0.25 mg Oral Daily  . feeding supplement (PRO-STAT SUGAR FREE 64)  30 mL Oral BID  . furosemide  60 mg Oral BID  . insulin aspart  0-20 Units Subcutaneous TID WC  . metroNIDAZOLE  500 mg Oral Q8H  . potassium chloride  20 mEq Oral BID  . risperiDONE  0.25 mg Oral BID  . sodium chloride  10-40 mL Intracatheter Q12H  . temazepam  7.5 mg Oral QHS  . Warfarin - Pharmacist Dosing Inpatient   Does not apply q1800   Infusions:    Assessment: 54 yo male with afib and mechanical AVR is currently on therapeutic coumadin.  INR today is 2.84 from 3.3.  On flagyl.  Goal of Therapy:  INR 2.5-3.5 Monitor platelets by anticoagulation protocol: Yes   Plan:  -  Coumadin 5mg  po x 1 again - Daily PT / INR - Watch INR closely while on Flagyl Ahmarion Saraceno, Tsz-Yin 02/12/2015,8:29 AM

## 2015-02-12 NOTE — Progress Notes (Signed)
Filled patient's home CPAP unit with sterile water and turned it on for him. Patient is tolerating it well and RT will continue to monitor.

## 2015-02-13 ENCOUNTER — Inpatient Hospital Stay (HOSPITAL_COMMUNITY): Payer: Medicare HMO

## 2015-02-13 ENCOUNTER — Inpatient Hospital Stay (HOSPITAL_COMMUNITY): Payer: Medicare HMO | Admitting: *Deleted

## 2015-02-13 ENCOUNTER — Inpatient Hospital Stay (HOSPITAL_COMMUNITY): Payer: Medicare HMO | Admitting: Speech Pathology

## 2015-02-13 ENCOUNTER — Inpatient Hospital Stay (HOSPITAL_COMMUNITY): Payer: Self-pay | Admitting: Occupational Therapy

## 2015-02-13 DIAGNOSIS — I509 Heart failure, unspecified: Secondary | ICD-10-CM

## 2015-02-13 LAB — GLUCOSE, CAPILLARY
GLUCOSE-CAPILLARY: 85 mg/dL (ref 65–99)
GLUCOSE-CAPILLARY: 101 mg/dL — AB (ref 65–99)
Glucose-Capillary: 90 mg/dL (ref 65–99)

## 2015-02-13 LAB — PROTIME-INR
INR: 2.94 — AB (ref 0.00–1.49)
Prothrombin Time: 30.1 seconds — ABNORMAL HIGH (ref 11.6–15.2)

## 2015-02-13 MED ORDER — FUROSEMIDE 40 MG PO TABS
40.0000 mg | ORAL_TABLET | Freq: Two times a day (BID) | ORAL | Status: DC
  Administered 2015-02-14 – 2015-02-15 (×3): 40 mg via ORAL
  Filled 2015-02-13 (×5): qty 1

## 2015-02-13 MED ORDER — FUROSEMIDE 40 MG PO TABS
40.0000 mg | ORAL_TABLET | Freq: Three times a day (TID) | ORAL | Status: DC
  Filled 2015-02-13 (×2): qty 1

## 2015-02-13 MED ORDER — RISPERIDONE 0.25 MG PO TABS
0.2500 mg | ORAL_TABLET | Freq: Every day | ORAL | Status: DC
  Administered 2015-02-14: 0.25 mg via ORAL
  Filled 2015-02-13 (×2): qty 1

## 2015-02-13 MED ORDER — TEMAZEPAM 7.5 MG PO CAPS
7.5000 mg | ORAL_CAPSULE | Freq: Every evening | ORAL | Status: DC | PRN
  Administered 2015-02-13: 7.5 mg via ORAL
  Filled 2015-02-13: qty 1

## 2015-02-13 MED ORDER — WARFARIN SODIUM 5 MG PO TABS
5.0000 mg | ORAL_TABLET | Freq: Once | ORAL | Status: AC
  Administered 2015-02-13: 5 mg via ORAL
  Filled 2015-02-13: qty 1

## 2015-02-13 NOTE — Progress Notes (Signed)
Subjective: Still SOB at times  Objective: Vital signs in last 24 hours: Temp:  [97.8 F (36.6 C)-98.9 F (37.2 C)] 98.9 F (37.2 C) (06/14 1416) Pulse Rate:  [70-92] 81 (06/14 1416) Resp:  [18-34] 34 (06/14 1416) BP: (113-121)/(57-68) 119/68 mmHg (06/14 1416) SpO2:  [97 %-100 %] 97 % (06/14 1416) Weight:  [226 lb 5.9 oz (102.68 kg)] 226 lb 5.9 oz (102.68 kg) (06/14 0621) Weight change: 5 lb 12.3 oz (2.616 kg) Last BM Date: 02/12/15 Intake/Output from previous day: 06/13 0701 - 06/14 0700 In: 1590 [P.O.:1560; I.V.:30] Out: 1500 [Urine:1500] Intake/Output this shift: Total I/O In: 360 [P.O.:360] Out: -   PE: General:Pleasant affect, NAD Skin:Warm and dry, brisk capillary refill HEENT:normocephalic, sclera clear, mucus membranes moist Heart:S1S2 RRR with soft systolic murmur, + mechanical sounds, no gallup, rub or click Lungs: without rales, rhonchi, or wheezes diminished on Rt base more than Lt HXT:AVWPV, soft, non tender, + BS, do not palpate liver spleen or masses Ext:+ lower ext edema, legs wrapped, 2+ radial pulses Neuro:alert and oriented X 3, MAE, follows commands, + facial symmetry   Lab Results:  Recent Labs  02/12/15 0552  WBC 6.8  HGB 10.6*  HCT 34.7*  PLT 248   BMET  Recent Labs  02/12/15 0552  NA 136  K 3.6  CL 98*  CO2 30  GLUCOSE 87  BUN 16  CREATININE 0.87  CALCIUM 8.1*   No results for input(s): TROPONINI in the last 72 hours.  Invalid input(s): CK, MB  Lab Results  Component Value Date   CHOL 118 08/18/2012   HDL 27.20* 08/18/2012   LDLCALC 64 08/18/2012   TRIG 133.0 08/18/2012   CHOLHDL 4 08/18/2012   Lab Results  Component Value Date   HGBA1C 5.9* 01/22/2015     Lab Results  Component Value Date   TSH 1.67 10/08/2011    Hepatic Function Panel No results for input(s): PROT, ALBUMIN, AST, ALT, ALKPHOS, BILITOT, BILIDIR, IBILI in the last 72 hours. No results for input(s): CHOL in the last 72 hours. No  results for input(s): PROTIME in the last 72 hours.     Studies/Results: Echo: Study Conclusions  - Left ventricle: The cavity size was normal. There was severe concentric hypertrophy. Systolic function was vigorous. The estimated ejection fraction was in the range of 65% to 70%. Wall motion was normal; there were no regional wall motion abnormalities. - Aortic valve: A mechanical prosthesis was present and functioning normally. Valve area (VTI): 1.21 cm^2. Valve area (Vmax): 1.04 cm^2. Valve area (Vmean): 1.15 cm^2. - Mitral valve: A mechanical prosthesis was present and functioning normally. Valve area by continuity equation (using LVOT flow): 2.17 cm^2. - Left atrium: The atrium was severely dilated. - Right atrium: The atrium was moderately dilated. - Tricuspid valve: There was moderate regurgitation. - Pulmonary arteries: Systolic pressure was moderately increased. PA peak pressure: 52 mm Hg (S).  Impressions:  - No cardiac source of emboli was indentified  Medications: I have reviewed the patient's current medications. Scheduled Meds: . allopurinol  300 mg Oral Daily  . aspirin EC  81 mg Oral Daily  . atenolol  50 mg Oral BID  . cholestyramine light  4 g Oral Q0600  . collagenase   Topical Daily  . digoxin  0.25 mg Oral Daily  . feeding supplement (PRO-STAT SUGAR FREE 64)  30 mL Oral BID  . furosemide  40 mg Oral TID AC  . insulin  aspart  0-20 Units Subcutaneous TID WC  . potassium chloride  20 mEq Oral BID  . [START ON 02/14/2015] risperiDONE  0.25 mg Oral QHS  . sodium chloride  10-40 mL Intracatheter Q12H  . warfarin  5 mg Oral ONCE-1800  . Warfarin - Pharmacist Dosing Inpatient   Does not apply q1800   Continuous Infusions:  PRN Meds:.alum & mag hydroxide-simeth, bisacodyl, diphenhydrAMINE, Gerhardt's butt cream, levalbuterol, methocarbamol, oxyCODONE, prochlorperazine **OR** prochlorperazine **OR** prochlorperazine, senna-docusate, sodium  chloride, temazepam, white petrolatum  Assessment/Plan: 54 year old male with h/o CAD, CABG, Bentall procedure, MVR on 01/24/15 with recurrent LE edema and episodic dyspnea- became very weak after trip to [place] earlier today  1. Recurrent LE edema - there is component of chronic lymphedema that is chronic and acute on chronic diastolic CHF - some rales at right pleural effusion on physical exam  The patient is in fact 20 lbs lighter than on admission, however still fluid overloaded, I would continue higher doses of lasix however would break them into smaller doses as he states that he feels hypotensive after them. Switch to 40 mg po TID. Was 231 lbs at d/c from main hospital on the 9th,  Wt today up to 226. Not sure if I & O are correct.   Occupational therapy for lymphedema wrapping, the patient can't tolerate compression stockings and uses Flextouch machine at home.  Wife concerned that he is voiding too much- bed gets wet, will offer condom catheter.   2. Acute on chronic diastolic CHF - post surgical echocardiogram as above, Crea is stable Echo with improved PA pk pressure, down from 73 mm Hg Dr. Claiborne Billings to see.     LOS: 5 days   Time spent with pt. :15 minutes. [facility] R  Nurse Practitioner Certified Pager [phone] or after 5pm and on weekends call [phone] 02/13/2015, 4:23 PM   Patient seen and examined. Agree with assessment and plan.I/O +595.  Going to bathroom frequently on lasix 40 tid. He feels that he is getting dehydrated.  Will decrease lasix to bid. CXR today. F/u BNP in am.   Troy Sine, MD, [facility] 02/13/2015 5:04 PM

## 2015-02-13 NOTE — Progress Notes (Signed)
Recreational Therapy Session Note  Patient Details  Name: Amalio Loe MRN: [id number] Date of Birth: [birth year] Today's Date: 02/13/2015  Pain: no c/o Skilled Therapeutic Interventions/Progress Updates: Pt participated in community reintegration/outing to [facility] Improvement at overall supervision ambulatory level and mod I using motorized cart.  Pt did experience 1 LOB when getting on motorized cart due to poor foot placement.  Goals focused on safe functional mobility on various community surfaces, identifying and negotiating obstacle, accessing public restrooms, energy conservation techniques, self monitoring.  See outing goal sheet in shadow chart for full details.  Therapy/Group: [employer]   Rashaud Ybarbo 02/13/2015, 2:15 PM

## 2015-02-13 NOTE — Progress Notes (Signed)
  Echocardiogram 2D Echocardiogram has been performed.  Lance Cook M 02/13/2015, 2:48 PM

## 2015-02-13 NOTE — Progress Notes (Signed)
Speech Language Pathology Daily Session Note  Patient Details  Name: Lance Cook MRN: [id number] Date of Birth: 04/16/1961  Today's Date: 02/13/2015 SLP Individual Time: 0800-0900 SLP Individual Time Calculation (min): 60 min  Short Term Goals: Week 1: SLP Short Term Goal 1 (Week 1): Patient will demonstrate selective attention to a task in a minimally disctracting enviornment for 45 minutes with Supervision verbal cues for redireciton.  SLP Short Term Goal 2 (Week 1): Patient will demonstrate functional problem solving for basic and familiar tasks with supervision verbal cues.  SLP Short Term Goal 3 (Week 1): Patient will utilize external aids to recall new, daily information with supervision verbal cues.  SLP Short Term Goal 4 (Week 1): Patient will self-monitor and correct errors during functional tasks with supervision question and verbal cues.   Skilled Therapeutic Interventions: Skilled treatment session focused on cognitive goals. SLP facilitated session by providing Min A for organization of information and supervision verbal cues for problem solving during a mildly complex, money management task. Suspect function is due to mild impulsivity with task and decreased self-monitoring of errors, however, patient demonstrated emergent awareness of need to "slow down."  Patient was also re-administered the MoCA and scored a 25/30 with a score of 26 or above considered normal. Patient demonstrated improvement, however, continues to demonstrate mild impairment in recall and attention.  Both the patient and his wife note cognitive improvement but wife reports patient needs a "little longer." Patient left upright in recliner with wife present and all needs within reach.  Continue with current plan of care.    FIM:  Comprehension Comprehension Mode: Auditory Comprehension: 5-Understands complex 90% of the time/Cues < 10% of the time Expression Expression: 5-Expresses complex 90% of the time/cues <  10% of the time Social Interaction Social Interaction: 5-Interacts appropriately 90% of the time - Needs monitoring or encouragement for participation or interaction. Problem Solving Problem Solving: 5-Solves complex 90% of the time/cues < 10% of the time Memory Memory: 5-Requires cues to use assistive device  Pain Pain Assessment Pain Assessment: No/denies pain  Therapy/Group: Individual Therapy  Katina Remick 02/13/2015, 2:34 PM

## 2015-02-13 NOTE — Plan of Care (Signed)
Problem: RH PAIN MANAGEMENT Goal: RH STG PAIN MANAGED AT OR BELOW PT'S PAIN GOAL Pain less than 3  Outcome: Not Progressing Reports pain as 5

## 2015-02-13 NOTE — Progress Notes (Signed)
[facility]     PROGRESS NOTE    Subjective/Complaints: Pt overall says he feels much better. Excited to go on pass today. Wonders when he might be able to leave. Still concerned about legs.  ROS: Pt denies fever, rash/itching, headache, blurred or double vision, nausea, vomiting, abdominal pain, diarrhea, chest pain, shortness of breath, palpitations, dysuria, dizziness, neck or back pain, bleeding, or depression  Objective: Vital Signs: Blood pressure 113/67, pulse 92, temperature 97.8 F (36.6 C), temperature source Oral, resp. rate 18, weight 102.68 kg (226 lb 5.9 oz), SpO2 100 %. No results found.  Recent Labs  02/12/15 0552  WBC 6.8  HGB 10.6*  HCT 34.7*  PLT 248    Recent Labs  02/12/15 0552  NA 136  K 3.6  CL 98*  GLUCOSE 87  BUN 16  CREATININE 0.87  CALCIUM 8.1*   CBG (last 3)   Recent Labs  02/12/15 1709 02/12/15 2049 02/13/15 0654  GLUCAP 70 79 90    Wt Readings from Last 3 Encounters:  02/13/15 102.68 kg (226 lb 5.9 oz)  02/08/15 104.826 kg (231 lb 1.6 oz)  01/22/15 111.041 kg (244 lb 12.8 oz)    Physical Exam:  Constitutional: He is oriented to person, place, and time. He appears well-developed and well-nourished.  Morbidly obese male.  HENT:  Head: Normocephalic and atraumatic.  Eyes: Conjunctivae are normal. Pupils are equal, round, and reactive to light.  Neck: Normal range of motion. Neck supple.  Cardiovascular: Normal rate. An irregular rhythm is present  Positive valve click is present.  Respiratory: Effort normal and breath sounds normal. No respiratory distress. He has no wheezes or rales  GI: Soft. Bowel sounds are normal. There is no tenderness.  Musculoskeletal: Edema:  1+  2+ edema bilateral LE edema, left greater than right, feet more than bilateral shins which are wrapped. Both legs with stasis dermatitis  Neurological: He is alert and oriented to person, place, and time. Has  basic insight and awareness Motor strength is 4/5 bilateral deltoid, biceps, triceps, grip 3 minus bilateral hip flexor 4 minus bilateral knee extensor 4 bilateral ankle dorsiflexor plantar flexor Sensation intact to light touch in bilateral upper and lower limbs  Skin: Skin is warm and dry.  Dry  skin bilateral shins. Intergluteal ares with abraded areas due to MASD. several wounds/ abrasions along right chest wall. One suture remains in chest. Psych: patient a little anxious, distracted  Assessment/Plan: 1. Functional deficits secondary to debility/encephalopathy after MVR which require 3+ hours per day of interdisciplinary therapy in a comprehensive inpatient rehab setting. Physiatrist is providing close team supervision and 24 hour management of active medical problems listed below. Physiatrist and rehab team continue to assess barriers to discharge/monitor patient progress toward functional and medical goals. FIM: FIM - Bathing Bathing Steps Patient Completed: Chest, Right Arm, Left Arm, Abdomen, Front perineal area, Buttocks, Right upper leg, Left upper leg, Left lower leg (including foot), Right lower leg (including foot) Bathing: 5: Supervision: Safety issues/verbal cues  FIM - Upper Body Dressing/Undressing Upper body dressing/undressing steps patient completed: Thread/unthread right sleeve of pullover shirt/dresss, Thread/unthread left sleeve of pullover shirt/dress, Put head through opening of pull over shirt/dress, Pull shirt over trunk Upper body dressing/undressing: 5: Set-up assist to: Obtain clothing/put away FIM - Lower Body Dressing/Undressing Lower body dressing/undressing steps patient completed: Thread/unthread right underwear leg, Thread/unthread left underwear leg, Pull underwear up/down, Thread/unthread right pants leg, Thread/unthread left pants leg, Pull pants up/down, Don/Doff  left shoe Lower body dressing/undressing: 3: Mod-Patient completed 50-74% of tasks  FIM  - Toileting Toileting steps completed by patient: Adjust clothing prior to toileting, Adjust clothing after toileting Toileting Assistive Devices: Grab bar or rail for support Toileting: 5: Supervision: Safety issues/verbal cues  FIM - Radio producer Devices: Insurance account manager Transfers: 5-To toilet/BSC: Supervision (verbal cues/safety issues), 5-From toilet/BSC: Supervision (verbal cues/safety issues)  FIM - Control and instrumentation engineer Devices: Environmental consultant, Arm rests Bed/Chair Transfer: 5: Chair or W/C > Bed: Supervision (verbal cues/safety issues), 5: Bed > Chair or W/C: Supervision (verbal cues/safety issues)  FIM - Locomotion: Wheelchair Distance: 110 Locomotion: Wheelchair: 5: Travels 50 - 149 ft, turns around, maneuvers to table, bed or toilet, negotiates 3% grade: modified independent FIM - Locomotion: Ambulation Locomotion: Ambulation Assistive Devices: Walker - Rolling Locomotion: Ambulation: 5: Travels 150 ft or more with supervision/safety issues  Comprehension Comprehension: 5-Follows basic conversation/direction: With extra time/assistive device  Expression Expression: 5-Expresses basic 90% of the time/requires cueing < 10% of the time.  Social Interaction Social Interaction: 5-Interacts appropriately 90% of the time - Needs monitoring or encouragement for participation or interaction.  Problem Solving Problem Solving: 5-Solves basic problems: With no assist  Memory Memory: 5-Requires cues to use assistive device Medical Problem List and Plan: 1. Functional deficits secondary to Deconditioning after mitral valve replacement 01/24/2015 in a patient with morbid obesity and postoperative encephalopathy 2. DVT Prophylaxis/Anticoagulation: Pharmaceutical: Coumadin per pharmacy--remains supratherapeutic 3. Chronic pain/ Pain Management: oxycodone and robaxin prn---doesn't appear to be in overt pain 4. Delirium/Mood:  Resolving--Monitor with resumption of Buspar as well as restoril. LCSW to follow also  Has been emotionally stabile on rehab thus far 5. Neuropsych: This patient is capable of making decisions on his own behalf. 6. MASD sacrum/Skin/Wound Care: Routine pressure relief measures. Santyl with daily dressing changes to sacral MASD.   -surgery following along 7. Fluids/Electrolytes/Nutrition: encourage PO. Labs reviewed and normal 8. Fluid overload with anasarca: Improving with diuresis. Continue to monitor daily weights. 9. Chronic diastolic CHF: checking daily weights.   -lasix adjusted to 40mg  tid to avoid hypotension  -continue digoxin and atenolol.   -lymphedema wraps ordered by cards. Had ordered ACE wrapping prior  -appreciate cards assist. Echo ordered 10 PAF: Monitor heart rate every 8 hours. Continue atenolol, digoxin and coumadin.  11. OSA: Continue to use CPAP daily. 12. C diff colitis: Metronidazole for 14 days (through 6/13). Questran works well for him 60. ABLA: hgb trending up 14. DM type 2: Diet controlled. Hgb A1C-5.9. sugars quite low.  Diet liberalized  -appreciate RD assist. + 14. Anxiety disorder: Buspar resumed---affect generally appropriate 16. Chronic insomnia: Resumed Restoril. 17. Leucocytosis: Improving. No signs of infection.   18. Minimally invasive MVR: Monitor wound for healing. No need for sternal precautions.  19. Chronic peripheral edema: elevation and ACE wraps bilateral LE's as above, lyhpmedema wraps   LOS (Days) 5 A FACE TO FACE EVALUATION WAS PERFORMED  Lance Cook T 02/13/2015 8:32 AM

## 2015-02-13 NOTE — Progress Notes (Signed)
Physical Therapy Discharge Summary  Patient Details  Name: Lance Cook MRN: [id number] Date of Birth: 08/09/1961   Patient has met 9 of 9 long term goals due to improved activity tolerance, improved balance, increased strength and ability to compensate for deficits.  Patient to discharge at an ambulatory level Modified Independent.   Patient's care partner is independent to provide the necessary supervision assistance at discharge.  Reasons goals not met: n/a - all goals met at this time  Recommendation:  Patient will benefit from ongoing skilled PT services in home health setting to continue to advance safe functional mobility, address ongoing impairments in endurance, strength, balance, functional mobility, and minimize fall risk.  Equipment: No equipment provided  Reasons for discharge: treatment goals met and discharge from hospital  Patient/family agrees with progress made and goals achieved: Yes  PT Discharge Precautions/Restrictions Restrictions Weight Bearing Restrictions: No Cognition Overall Cognitive Status: Within Functional Limits for tasks assessed Safety/Judgment: Appears intact Sensation Sensation Light Touch: Appears Intact Coordination Gross Motor Movements are Fluid and Coordinated: Yes Motor  Motor Motor: Within Functional Limits  Locomotion    Mod I gait without AD Trunk/Postural Assessment  Cervical Assessment Cervical Assessment: Within Functional Limits Thoracic Assessment Thoracic Assessment: Within Functional Limits Lumbar Assessment Lumbar Assessment: Within Functional Limits  Balance Balance Balance Assessed: Yes Standardized Balance Assessment Standardized Balance Assessment: Berg Balance Test Berg Balance Test Sit to Stand: Able to stand without using hands and stabilize independently Standing Unsupported: Able to stand safely 2 minutes Sitting with Back Unsupported but Feet Supported on Floor or Stool: Able to sit safely and securely 2  minutes Stand to Sit: Sits safely with minimal use of hands Transfers: Able to transfer safely, minor use of hands Standing Unsupported with Eyes Closed: Able to stand 10 seconds safely Standing Ubsupported with Feet Together: Able to place feet together independently and stand 1 minute safely From Standing, Reach Forward with Outstretched Arm: Can reach forward >12 cm safely (5") From Standing Position, Pick up Object from Floor: Able to pick up shoe safely and easily From Standing Position, Turn to Look Behind Over each Shoulder: Looks behind from both sides and weight shifts well Turn 360 Degrees: Able to turn 360 degrees safely one side only in 4 seconds or less Standing Unsupported, Alternately Place Feet on Step/Stool: Able to stand independently and safely and complete 8 steps in 20 seconds Standing Unsupported, One Foot in Front: Able to plae foot ahead of the other independently and hold 30 seconds Standing on One Leg: Tries to lift leg/unable to hold 3 seconds but remains standing independently Total Score: 50 Extremity Assessment  RLE Assessment RLE Assessment: Within Functional Limits RLE Strength RLE Overall Strength Comments: grossly WFL; decreased muscular endurance LLE Assessment LLE Assessment: Within Functional Limits LLE Strength LLE Overall Strength Comments: grossly WFL; decreased muscular endurance  See FIM for current functional status  Canary Brim Ivory Broad, PT, DPT  02/15/2015, 7:36 AM

## 2015-02-13 NOTE — Progress Notes (Signed)
Wife insistent that sedative be d/c as patient is sleeping too soundly which in turn is leading to incontinence at nights.  She is insistent that he is no longer depressed or anxious and does not need buspar.   Discussed patient's medications as well as rationale for them. Discussed that Restoril likely causing deep sleep and patient agreeable to changing this to prn. Will change timing of evening lasix to supper, D/C buspar as patient and wife feel that this is not indicated and will taper off Risperdal.

## 2015-02-13 NOTE — Consult Note (Signed)
WOC wound follow up Wound type: gluteal fissure, noted today per nurse request and patient to look at his right groin.  This is catheterization site with some necrotic tissue now, CVTS notes this same area Measurement: Right groin: 1.0cmx 2.5cm x 0.2cm  Gluteal cleft: 1.7cm x 2.0cm x 0. 2cm   Wound bed: Right groin: 100% yellow soft slough Gluteal cleft: 100% yellow soft Drainage (amount, consistency, odor) minimal at each site, no purulence Periwound: intact  Dressing procedure/placement/frequency: Continue enzymatic debridement ointment to the gluteal cleft wound, making sure wife understands how to continue to apply once DC to home.  Add enzymatic debridement ointment to the right groin daily, cover with dry gauze.  [facility] team will follow along with you for weekly wound assessments.  Please notify me of any acute changes in the wounds or any new areas of concerns Para March RN,CWOCN [phone]

## 2015-02-13 NOTE — Progress Notes (Signed)
Pt. Was found on his home CPAP with nasal prongs. Pt. Is tolerating CPAP well at this time without any complications.

## 2015-02-13 NOTE — Progress Notes (Signed)
      [address]       [place] [address]             [phone]     CARDIOTHORACIC SURGERY PROGRESS NOTE  Subjective: Overall doing well.  Went out of hospital on pass to [facility] [employer] today.  Making good progress.  Some dyspnea this afternoon  Objective: Vital signs in last 24 hours: Temp:  [97.8 F (36.6 C)-98.9 F (37.2 C)] 98.9 F (37.2 C) (06/14 1416) Pulse Rate:  [70-92] 81 (06/14 1416) Cardiac Rhythm:  [-]  Resp:  [18-34] 34 (06/14 1416) BP: (113-121)/(57-68) 119/68 mmHg (06/14 1416) SpO2:  [97 %-100 %] 97 % (06/14 1416) Weight:  [102.68 kg (226 lb 5.9 oz)] 102.68 kg (226 lb 5.9 oz) (06/14 4742)  Physical Exam:    Breath sounds: Clear but somewhat diminished at bases R>L  Heart sounds:  Irregular rate and rhythm, mechanical sounds, grade II/VI systolic murmur  Incisions:  Healing well.  Some skin edge necrosis right groin incision - overall stable/improved  Abdomen:  Soft, non-distended, non-tender  Extremities:  Warm, well-perfused, swollen   Intake/Output from previous day: 06/13 0701 - 06/14 0700 In: 1590 [P.O.:1560; I.V.:30] Out: 1500 [Urine:1500] Intake/Output this shift: Total I/O In: 360 [P.O.:360] Out: -   Lab Results:  Recent Labs  02/12/15 0552  WBC 6.8  HGB 10.6*  HCT 34.7*  PLT 248   BMET:  Recent Labs  02/12/15 0552  NA 136  K 3.6  CL 98*  CO2 30  GLUCOSE 87  BUN 16  CREATININE 0.87  CALCIUM 8.1*    CBG (last 3)   Recent Labs  02/12/15 2049 02/13/15 0654 02/13/15 1208  GLUCAP 79 90 85   PT/INR:   Recent Labs  02/13/15 0505  LABPROT 30.1*  INR 2.94*    CXR:  N/A  Assessment/Plan:  Overall making slow but steady progress Efforts of entire PMR team appreciated Recent input from Cardiology appreciated - await results of f/u echo Will get f/u CXR r/o effusions  Rexene Alberts 02/13/2015 3:31 PM

## 2015-02-13 NOTE — Progress Notes (Signed)
Physical Therapy Session Note  Patient Details  Name: Lance Cook MRN: [id number] Date of Birth: 1961/08/02  Today's Date: 02/13/2015 PT Concurrent Time: 1030-1200 PT Concurrent Time Calculation (min): 90 min  Short Term Goals: Week 1:  PT Short Term Goal 1 (Week 1): STG=LTG due to ELOS  Pt participated in community reintegration outing to [facility] to address community mobility, gait in community environment, overall activity tolerance/endurance, problem solving in community setting, energy conservation, balance, and safety with community mobility. Pt used motorized scooter majority of the time but able to gait without AD to tolerance (in and out of the store and at times in the store) with emphasis on self monitoring and energy conservation due to medical condition. Pt performed mobility at overall S/mod I level at times requiring cues for safety. 1 episode of LOB when getting onto scooter due to foot placement. Pt able to go up/down van steps with min A for balance. See goal sheet for complete goals and details.    Therapy Documentation Precautions:  Precautions Precautions: Fall Restrictions Weight Bearing Restrictions: No Pain:  No complaints of pain.   See FIM for current functional status  Therapy/Group: Co-Treatment and Community Reintegration Outing  Canary Brim Ivory Broad, PT, DPT  02/13/2015, 12:20 PM

## 2015-02-13 NOTE — Progress Notes (Signed)
Occupational Therapy Session Note  Patient Details  Name: Lance Cook MRN: [id number] Date of Birth: 1961/08/13  Today's Date: 02/13/2015 OT Individual Time: 0900-1000 OT Individual Time Calculation (min): 60 min    Short Term Goals: Week 1:  OT Short Term Goal 1 (Week 1): STG=LTG  Skilled Therapeutic Interventions/Progress Updates:  Patient received seated in recliner, no complaints of pain. Therapist present for lymphedema compression wrapping, acknowledging order from Dr. Meda Coffee with cardiology. Pt adamant to shower prior to wrapping. Pt ambulated into BR for ADL in standing, pt refused using shower seat. Patient then ambulated back to room and laid in supine for therapist to complete compression wrapping using lotion prior, then one artiflex, one 8cm short stretch bandage, and one 10cm short stretch bandage per LE. Patient unable to tolerate TEDs secondary to sensitivity and per Dr notes, ace wrapping has not been working/sufficent. Completed light lymphedema wrapping just proximal of metatarsals to just distal of knee. Minimal compression applied during wrapping secondary to patient's history of CHF, venous insufficiency to BLEs, recent mitral valve replacement, CAD, PVD, AVR 99', overall long standing heart conditions. Patient going on an outing today with therapy staff, believe this light compression wrapping for outing is appropriate. Do recommend pt continue to use Flexi machine to reduce edema/lymphedema at home as instructed by care provider. Do not recommend ongoing compression wrapping due to medical history. At end of session, left patient seated in bed side chair with wife and visitor present; all needs within reach.   Therapy Documentation Precautions:  Precautions Precautions: Fall Restrictions Weight Bearing Restrictions: No  Vital Signs: Therapy Vitals Pulse Rate: 92 BP: 113/67 mmHg Patient Position (if appropriate): Sitting Oxygen Therapy SpO2: 100 % O2 Device: Not  Delivered  See FIM for current functional status  Therapy/Group: Individual Therapy  Darrek Leasure , MS, OTR/L, CLT Pager: [phone]  02/13/2015, 10:27 AM

## 2015-02-13 NOTE — Progress Notes (Signed)
ANTICOAGULATION CONSULT NOTE - Follow Up Consult  Pharmacy Consult for coumadin Indication: afib and mechanical AVR  Allergies  Allergen Reactions  . Penicillins     REACTION: Hives  . Adhesive [Tape] Other (See Comments)    Shin irritation    Patient Measurements: Weight: 226 lb 5.9 oz (102.68 kg) Heparin Dosing Weight:   Vital Signs: Temp: 97.8 F (36.6 C) (06/14 0505) Temp Source: Oral (06/14 0505) BP: 113/67 mmHg (06/14 0747) Pulse Rate: 92 (06/14 0747)  Labs:  Recent Labs  02/11/15 0510 02/12/15 0552 02/13/15 0505  HGB  --  10.6*  --   HCT  --  34.7*  --   PLT  --  248  --   LABPROT 33.2* 29.3* 30.1*  INR 3.34* 2.84* 2.94*  CREATININE  --  0.87  --     Estimated Creatinine Clearance: 100.7 mL/min (by C-G formula based on Cr of 0.87).   Medications:  Scheduled:  . allopurinol  300 mg Oral Daily  . aspirin EC  81 mg Oral Daily  . atenolol  50 mg Oral BID  . busPIRone  7.5 mg Oral BID  . cholestyramine light  4 g Oral Q0600  . collagenase   Topical Daily  . digoxin  0.25 mg Oral Daily  . feeding supplement (PRO-STAT SUGAR FREE 64)  30 mL Oral BID  . furosemide  40 mg Oral 3 times per day  . insulin aspart  0-20 Units Subcutaneous TID WC  . metroNIDAZOLE  500 mg Oral Q8H  . potassium chloride  20 mEq Oral BID  . risperiDONE  0.25 mg Oral BID  . sodium chloride  10-40 mL Intracatheter Q12H  . temazepam  7.5 mg Oral QHS  . Warfarin - Pharmacist Dosing Inpatient   Does not apply q1800   Infusions:    Assessment: 54 yo male with afib and mechanical AVR is currently on therapeutic coumadin.  INR is 2.94 today.  Goal of Therapy:  INR 2.5-3.5 Monitor platelets by anticoagulation protocol: Yes   Plan:  -  Coumadin 5mg  po x 1 again - Daily PT / INR - Watch INR closely while on Flagyl (last day toady)  Donica Derouin, Tsz-Yin 02/13/2015,8:20 AM

## 2015-02-14 ENCOUNTER — Inpatient Hospital Stay (HOSPITAL_COMMUNITY): Payer: Medicare HMO | Admitting: Physical Therapy

## 2015-02-14 ENCOUNTER — Telehealth: Payer: Self-pay | Admitting: *Deleted

## 2015-02-14 ENCOUNTER — Inpatient Hospital Stay (HOSPITAL_COMMUNITY): Payer: Self-pay | Admitting: Occupational Therapy

## 2015-02-14 ENCOUNTER — Inpatient Hospital Stay (HOSPITAL_COMMUNITY): Payer: Self-pay

## 2015-02-14 ENCOUNTER — Inpatient Hospital Stay (HOSPITAL_COMMUNITY): Payer: Medicare HMO | Admitting: Speech Pathology

## 2015-02-14 LAB — BASIC METABOLIC PANEL
ANION GAP: 7 (ref 5–15)
BUN: 14 mg/dL (ref 6–20)
CALCIUM: 8.3 mg/dL — AB (ref 8.9–10.3)
CO2: 29 mmol/L (ref 22–32)
Chloride: 98 mmol/L — ABNORMAL LOW (ref 101–111)
Creatinine, Ser: 0.77 mg/dL (ref 0.61–1.24)
GFR calc Af Amer: >60 mL/min (ref >60)
GFR calc non Af Amer: >60 mL/min (ref >60)
Glucose, Bld: 91 mg/dL (ref 65–99)
POTASSIUM: 3.9 mmol/L (ref 3.5–5.1)
SODIUM: 134 mmol/L — AB (ref 135–145)

## 2015-02-14 LAB — PROTIME-INR
INR: 2.59 — AB (ref 0.00–1.49)
Prothrombin Time: 27.4 seconds — ABNORMAL HIGH (ref 11.6–15.2)

## 2015-02-14 LAB — GLUCOSE, CAPILLARY
GLUCOSE-CAPILLARY: 119 mg/dL — AB (ref 65–99)
Glucose-Capillary: 85 mg/dL (ref 65–99)
Glucose-Capillary: 88 mg/dL (ref 65–99)
Glucose-Capillary: 96 mg/dL (ref 65–99)
Glucose-Capillary: 123 mg/dL — ABNORMAL HIGH (ref 65–99)

## 2015-02-14 LAB — MAGNESIUM: Magnesium: 1.6 mg/dL — ABNORMAL LOW (ref 1.7–2.4)

## 2015-02-14 LAB — BRAIN NATRIURETIC PEPTIDE: B Natriuretic Peptide: 235.9 pg/mL — ABNORMAL HIGH (ref 0.0–100.0)

## 2015-02-14 MED ORDER — MAGNESIUM OXIDE 400 (241.3 MG) MG PO TABS
400.0000 mg | ORAL_TABLET | Freq: Two times a day (BID) | ORAL | Status: DC
  Administered 2015-02-14 – 2015-02-15 (×2): 400 mg via ORAL
  Filled 2015-02-14 (×4): qty 1

## 2015-02-14 MED ORDER — WARFARIN SODIUM 7.5 MG PO TABS
7.5000 mg | ORAL_TABLET | Freq: Once | ORAL | Status: AC
Start: 2015-02-14 — End: 2015-02-14
  Administered 2015-02-14: 7.5 mg via ORAL
  Filled 2015-02-14 (×2): qty 1

## 2015-02-14 NOTE — Progress Notes (Signed)
Occupational Therapy Discharge Summary  Patient Details  Name: Lance Cook MRN: [id number] Date of Birth: 12-09-1960    Patient has met 11 of 11 long term goals due to improved activity tolerance, improved balance, postural control, improved attention, improved awareness and improved coordination.  Patient to discharge at overall Modified Independent level.   Recommendation:  Patient with no further OT needs at this time.   Equipment: Shower Chair  Reasons for discharge: treatment goals met and discharge from hospital  Patient/family agrees with progress made and goals achieved: Yes  OT Discharge Precautions/Restrictions  Precautions Precautions: Fall Restrictions Weight Bearing Restrictions: No ADL ADL ADL Comments: see FIM Vision/Perception  Vision- History Baseline Vision/History: Wears glasses Wears Glasses: At all times Patient Visual Report: No change from baseline  Cognition Overall Cognitive Status: Within Functional Limits for tasks assessed Arousal/Alertness: Awake/alert Orientation Level: Oriented X4 Attention: Selective Selective Attention: Appears intact Memory: Appears intact Awareness: Appears intact Problem Solving: Appears intact Safety/Judgment: Appears intact Sensation Sensation Light Touch: Appears Intact Proprioception: Appears Intact Coordination Gross Motor Movements are Fluid and Coordinated: Yes Fine Motor Movements are Fluid and Coordinated: Yes Motor  Motor Motor: Within Functional Limits Mobility  Bed Mobility Bed Mobility: Sit to Supine Sit to Supine: 6: Modified independent (Device/Increase time) Transfers Transfers: Sit to Stand;Stand to Sit Sit to Stand: 6: Modified independent (Device/Increase time) Stand to Sit: 6: Modified independent (Device/Increase time)  Trunk/Postural Assessment  Cervical Assessment Cervical Assessment: Within Functional Limits Thoracic Assessment Thoracic Assessment: Within Functional  Limits Lumbar Assessment Lumbar Assessment: Within Functional Limits Postural Control Postural Control: Within Functional Limits  Balance Balance Balance Assessed: Yes Dynamic Sitting Balance Sitting balance - Comments: independent Static Standing Balance Static Standing - Balance Support: No upper extremity supported Static Standing - Level of Assistance: 7: Independent Dynamic Standing Balance Dynamic Standing - Balance Support: No upper extremity supported Dynamic Standing - Level of Assistance: 7: Independent Dynamic Standing - Balance Activities: Reaching for objects;Forward lean/weight shifting;Lateral lean/weight shifting;Reaching for weighted objects;Reaching across midline Extremity/Trunk Assessment RUE Assessment RUE Assessment: Within Functional Limits LUE Assessment LUE Assessment: Within Functional Limits  See FIM for current functional status  [name], Kamya Watling C 02/14/2015, 4:40 PM

## 2015-02-14 NOTE — Progress Notes (Signed)
RT spoke to patient about getting his home CPAP setup since he moved rooms and the patient said that he would not be wearing the CPAP tonight and he just packed it up because he is going home tomorrow.  RT advised if he changed his mind to give a call. RT will continue to monitor.

## 2015-02-14 NOTE — Progress Notes (Signed)
Speech Language Pathology Session Note & Discharge Summary  Patient Details  Name: Lance Cook MRN: [id number] Date of Birth: 1960-09-30  Today's Date: 02/14/2015 SLP Individual Time: 1500-1530 SLP Individual Time Calculation (min): 30 min   Skilled Therapeutic Interventions:  Skilled treatment session focused on completion of patient and family education in regards to his current cognitive function and strategies to utilize at home to maximize recall and overall safety. A handout was also given to reinforce information. Both the patient and his wife verbalized understanding of all information. Patient appears to be at his cognitive baseline, therefore, f/u SLP services are not warranted at this time. Patient will discharge home with 24 hour supervision from his wife.   Patient has met 4 of 4 long term goals.  Patient to discharge at overall Supervision;Modified Independent level.   Reasons goals not met: N/A   Clinical Impression/Discharge Summary: Patient has made functional gains and has met 4 of 4 LTG's this admission due to increased attention, memory, problem solving and awareness. Currently, patient is supervision-Mod I to complete mildly complex and familiar tasks safety in regards to selective attention, recall of new information, functional problem solving and anticipatory awareness.  Patient and family education complete and patient will discharge home with his wife with 24 hour supervision. Patient appears to be at his cognitive baseline, therefore, skilled SLP f/u is not warranted at this time.   Care Partner:  Caregiver Able to Provide Assistance: Yes     Recommendation:  24 hour supervision/assistance;None      Equipment: N/A   Reasons for discharge: Treatment goals met;Discharged from hospital   Patient/Family Agrees with Progress Made and Goals Achieved: Yes   See FIM for current functional status  Lance Cook 02/14/2015, 4:23 PM

## 2015-02-14 NOTE — Telephone Encounter (Signed)
Faxed order for phase 2 cardiac rehab - no GXT required.  

## 2015-02-14 NOTE — Progress Notes (Signed)
Physical Therapy Session Note  Patient Details  Name: Reinaldo Helt MRN: [id number] Date of Birth: 1961-04-04  Today's Date: 02/14/2015 PT Individual Time: 2992-4268 PT Individual Time Calculation (min): 75 min   Short Term Goals: Week 1:  PT Short Term Goal 1 (Week 1): STG=LTG due to ELOS  Skilled Therapeutic Interventions/Progress Updates:   Pt received in recliner with wife in room.  Pt reporting fatigue from am PT session but agreeable to review and practice balance HEP for D/C tomorrow.  Provided pt with hand out of OTAGO dynamic balance HEP and reviewed all exercises with pt.  Pt gave repeat demonstration of exercises.  Safety issues discussed as well as discussed ways to safely progress exercises.  Pt and wife verbalized understanding.  Pt left in recliner with all items within reach.   Therapy Documentation Precautions:  Precautions Precautions: Fall Restrictions Weight Bearing Restrictions: No Vital Signs: Therapy Vitals Pulse Rate: 99 BP: 137/65 mmHg Patient Position (if appropriate): Sitting Oxygen Therapy SpO2: 98 % O2 Device: Not Delivered Pain: Pain Assessment Pain Assessment: No/denies pain Pain Score: 3  Pain Type: Chronic pain Pain Location: Back Pain Onset: With Activity Pain Intervention(s): RN made aware  See FIM for current functional status  Therapy/Group: Individual Therapy  Raylene Everts [facility] 02/14/2015, 12:46 PM

## 2015-02-14 NOTE — Progress Notes (Signed)
Occupational Therapy Session Note  Patient Details  Name: Lance Cook MRN: [id number] Date of Birth: 03-Nov-1960  Today's Date: 02/14/2015 OT Individual Time: 0945-1100 OT Individual Time Calculation (min): 75 min    Short Term Goals: Week 1:  OT Short Term Goal 1 (Week 1): STG=LTG  Skilled Therapeutic Interventions/Progress Updates:    Pt seen for OT ADL bathing and dressing session. Pt sitting up in w/Cook upon arrival, agreeable to tx. Pt ambulated throughout room with RW to gather clothing. He then ambulated into shower and completed bathing task  standing/ sitting mod I. Pt able to bend over to pick up items and move clothing without LOB and demonstrated good safety awareness, using grab bars and AE when able.  Pt dressed in w/Cook overall mod I with VCs provided for use of sock aid. Pt able to don B socks and shoes mod I with use of sock aid with encouragement. Pt completed grooming tasks standing at the sink mod I.    Extensive pt and caregiver education provided regarding fall risk and ways to decrease fall risk, importance of independence/ participation with ADLs/IADLs, energy conservation, DME. and d/Cook planning.    Lymphedema wrapping removed following shower as it was not able to stay dry during shower. Pt stated he would elevate LE following therapy and would be fine until use of home lymphedema machine tomorrow after d/Cook from hospital. RN made aware.   Therapy Documentation Precautions:  Precautions Precautions: Fall Restrictions Weight Bearing Restrictions: No Pain: Pain Assessment Pain Assessment: No/denies pain  ADL: ADL ADL Comments: see FIM  See FIM for current functional status  Therapy/Group: Individual Therapy  Lance Cook 02/14/2015, 7:20 AM

## 2015-02-14 NOTE — Progress Notes (Signed)
Patient Name: Lance Cook Date of Encounter: 02/14/2015  Primary Physician: [name], PA-C Primary Cardiologist: Dr [name]  Principal Problem:   Physical deconditioning Active Problems:   Essential hypertension   PERIPHERAL EDEMA   Chronic diastolic congestive heart failure   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   Enteritis due to Clostridium difficile   Encephalopathy acute  SUBJECTIVE  Feels better. Denies chest pain or palpitation. SOB improved.   CURRENT MEDS . allopurinol  300 mg Oral Daily  . aspirin EC  81 mg Oral Daily  . atenolol  50 mg Oral BID  . cholestyramine light  4 g Oral Q0600  . collagenase   Topical Daily  . digoxin  0.25 mg Oral Daily  . feeding supplement (PRO-STAT SUGAR FREE 64)  30 mL Oral BID  . furosemide  40 mg Oral BID  . insulin aspart  0-20 Units Subcutaneous TID WC  . potassium chloride  20 mEq Oral BID  . risperiDONE  0.25 mg Oral QHS  . sodium chloride  10-40 mL Intracatheter Q12H  . warfarin  7.5 mg Oral ONCE-1800  . Warfarin - Pharmacist Dosing Inpatient   Does not apply q1800    OBJECTIVE  Filed Vitals:   02/13/15 2229 02/14/15 0509 02/14/15 0901 02/14/15 0923  BP:  167/63 137/65   Pulse: 80 80 103 99  Temp:  98.9 F (37.2 C)    TempSrc:  Oral    Resp: 18 16    Weight:      SpO2: 97% 98% 98%     Intake/Output Summary (Last 24 hours) at 02/14/15 1425 Last data filed at 02/14/15 1300  Gross per 24 hour  Intake   1260 ml  Output    850 ml  Net    410 ml   Filed Weights   02/11/15 0500 02/12/15 0555 02/13/15 0621  Weight: 223 lb 15.8 oz (101.6 kg) 220 lb 9.6 oz (100.064 kg) 226 lb 5.9 oz (102.68 kg)    PHYSICAL EXAM  General: Pleasant, NAD. Neuro: Alert and oriented X 3. Moves all extremities spontaneously. Psych: Normal affect. HEENT:  Normal  Neck: Supple without bruits or JVD. Lungs:  Resp regular and unlabored. Diminished breath sound RLL. No wheezing or rales.  Heart:  irregular with regular rate.  Systolic murmur. Abdomen: Soft, non-tender, non-distended, BS + x 4.  Extremities: No clubbing, cyanosis or edema. DP/PT/Radials 2+ and equal bilaterally.  Accessory Clinical Findings  CBC  Recent Labs  02/12/15 0552  WBC 6.8  HGB 10.6*  HCT 34.7*  MCV 85.3  PLT 694   Basic Metabolic Panel  Recent Labs  02/12/15 0552 02/14/15 0440  NA 136 134*  K 3.6 3.9  CL 98* 98*  CO2 30 29  GLUCOSE 87 91  BUN 16 14  CREATININE 0.87 0.77  CALCIUM 8.1* 8.3*  MG  --  1.6*    Echo 02/13/15 LV EF: 65% -  70%  ------------------------------------------------------------------- Indications:   CHF - 428.0.  ------------------------------------------------------------------- History:  PMH:  Atrial fibrillation. Atrial fibrillation. Transient ischemic attack. Stroke.  ------------------------------------------------------------------- Study Conclusions  - Left ventricle: The cavity size was normal. There was severe concentric hypertrophy. Systolic function was vigorous. The estimated ejection fraction was in the range of 65% to 70%. Wall motion was normal; there were no regional wall motion abnormalities. - Aortic valve: A mechanical prosthesis was present and functioning normally. Valve area (VTI): 1.21 cm^2. Valve area (Vmax): 1.04 cm^2. Valve area (Vmean):  1.15 cm^2. - Mitral valve: A mechanical prosthesis was present and functioning normally. Valve area by continuity equation (using LVOT flow): 2.17 cm^2. - Left atrium: The atrium was severely dilated. - Right atrium: The atrium was moderately dilated. - Tricuspid valve: There was moderate regurgitation. - Pulmonary arteries: Systolic pressure was moderately increased. PA peak pressure: 52 mm Hg (S).  Impressions:  - No cardiac source of emboli was indentified  ASSESSMENT AND PLAN    1. Recurrent leg edema/acute on chronic diastolic heart failure - reduced  lasix 40mg  po BID yesterday. Feeling better. BNP improved to 235. Echo showed LV EF of 65-70%; PA peak pressure: 52 mm Hg (S) which down from 24mm hg. creatinine normal.  - OT for lymphedema, flextouch machine at home. Intolerance to compression stocking.   2. Hypomagnesemia - Will start MAG-OX 400mg  BID. Will recheck in AM. K and creatinine normal.   3. Chronic AF - Continue warfarin. Rate controlled.   4. HTN -Stable. Continue current regimen.    Jarrett Soho PA-C Pager [phone]    Patient seen and examined. Agree with assessment and plan. Feeling better on reduced lasix dose.  Reploete MG with MgOxide 400 mg bid to get Mg level to 2.  For probable dc 02/15/15.   Troy Sine, MD, [facility] 02/14/2015 3:05 pm

## 2015-02-14 NOTE — Progress Notes (Signed)
ANTICOAGULATION CONSULT NOTE - Follow Up Consult  Pharmacy Consult for coumadin Indication: afib and mech MVR  Allergies  Allergen Reactions  . Penicillins     REACTION: Hives  . Adhesive [Tape] Other (See Comments)    Shin irritation    Patient Measurements: Weight: 226 lb 5.9 oz (102.68 kg) Heparin Dosing Weight:   Vital Signs: Temp: 98.9 F (37.2 C) (06/15 0509) Temp Source: Oral (06/15 0509) BP: 167/63 mmHg (06/15 0509) Pulse Rate: 80 (06/15 0509)  Labs:  Recent Labs  02/12/15 0552 02/13/15 0505 02/14/15 0440  HGB 10.6*  --   --   HCT 34.7*  --   --   PLT 248  --   --   LABPROT 29.3* 30.1* 27.4*  INR 2.84* 2.94* 2.59*  CREATININE 0.87  --  0.77    Estimated Creatinine Clearance: 109.5 mL/min (by C-G formula based on Cr of 0.77).   Medications:  Scheduled:  . allopurinol  300 mg Oral Daily  . aspirin EC  81 mg Oral Daily  . atenolol  50 mg Oral BID  . cholestyramine light  4 g Oral Q0600  . collagenase   Topical Daily  . digoxin  0.25 mg Oral Daily  . feeding supplement (PRO-STAT SUGAR FREE 64)  30 mL Oral BID  . furosemide  40 mg Oral BID  . insulin aspart  0-20 Units Subcutaneous TID WC  . potassium chloride  20 mEq Oral BID  . risperiDONE  0.25 mg Oral QHS  . sodium chloride  10-40 mL Intracatheter Q12H  . Warfarin - Pharmacist Dosing Inpatient   Does not apply q1800   Infusions:    Assessment: 54 yo male with afib and mech MVR is currently on therapeutic coumadin.  INR today is 2.59 from 2.94.  Goal of Therapy:  INR 2.5-3.5 Monitor platelets by anticoagulation protocol: Yes   Plan:  -  Coumadin 7.5mg  po x 1  - Daily PT / INR  Raman Featherston, Tsz-Yin 02/14/2015,8:26 AM

## 2015-02-14 NOTE — Progress Notes (Signed)
Social Work Patient ID: Lance Cook, male   DOB: Apr 11, 1961, 54 y.o.   MRN: [id number]   Met with pt and wife this morning to review team conference.  Both aware and agreeable with targeted d/c date of tomorrow.  Will arrange [place] f/u.  No further concerns.  Xochilt Conant, LCSW

## 2015-02-14 NOTE — Patient Care Conference (Signed)
Inpatient RehabilitationTeam Conference and Plan of Care Update Date: 02/13/2015   Time: 2:25 PM    Patient Name: Lance Cook      Medical Record Number: [id number]  Date of Birth: Feb 19, 1961 Sex: Male         Room/Bed: 4M10C/4M10C-01 Payor Info: Payor: [employer] MEDICARE / Plan: [place] HMO / Product Type: *No Product type* /    Admitting Diagnosis: debility  valve replant  Admit Date/Time:  02/08/2015  7:36 PM Admission Comments: No comment available   Primary Diagnosis:  Physical deconditioning Principal Problem: Physical deconditioning  Patient Active Problem List   Diagnosis Date Noted  . Hypomagnesemia 02/08/2015  . Physical deconditioning 02/08/2015  . Encephalopathy acute 02/05/2015  . Hypokalemia 02/05/2015  . Enteritis due to Clostridium difficile 01/30/2015  . HCAP (healthcare-associated pneumonia) 01/29/2015  . Acute respiratory failure with hypoxemia   . ARDS (adult respiratory distress syndrome) 01/27/2015  . 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 34/19/3790  . Chronic diastolic congestive heart failure   . Atelectasis   . History of mechanical aortic valve replacement 12/25/2014  . 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 Severe mitral regurgitation 10/11/2014  . Cellulitis 04/02/2014  . Hypotension 04/02/2014  . Venous insufficiency of both lower extremities 04/02/2014  . Morbid obesity 04/02/2014  . Encounter for therapeutic drug monitoring 02/02/2014  . Mass of left side of neck 08/18/2012  . Head or neck swelling, mass, or lump 07/15/2012  . Long term (current) use of anticoagulants 07/15/2012  . Hypogonadism male 04/08/2012  . History of adenomatous polyp of colon 12/23/2011  . Testicular cancer   . TIA (transient ischemic attack)   . OSA (obstructive sleep apnea)   . Lumbar spinal stenosis 10/08/2011  . Chronic LBP 10/08/2011  . CVA (cerebral vascular accident) 10/08/2011  .  Erectile dysfunction 10/08/2011  . Preventative health care 08/09/2011  . CAD (coronary artery disease)   . Varicose veins 05/01/2011  . SPINAL STENOSIS, LUMBAR 10/01/2010  . ULCER, LEG 04/29/2010  . Osteoarthritis of spine 04/29/2010  . DYSLIPIDEMIA 01/15/2010  . Chronic venous hypertension with ulcer 01/15/2010  . Essential hypertension 12/14/2009  . MUSCLE CRAMPS 12/14/2009  . LEG PAIN, LEFT 11/29/2009  . DIABETES MELLITUS, TYPE II 11/01/2009  . GOUT 11/01/2009  . Iron deficiency anemia 11/01/2009  . MYOCARDIAL INFARCTION, HX OF 11/01/2009  . ATRIAL FIBRILLATION, CHRONIC 11/01/2009  . INTERNAL HEMORRHOIDS 11/01/2009  . PERIPHERAL EDEMA 11/01/2009  . H/O mechanical aortic valve replacement 11/01/2009  . Ascending aortic dissection 07/14/2008    Expected Discharge Date: Expected Discharge Date: 02/15/15  Team Members Present: Physician leading conference: Dr. Alger Simons Social Worker Present: Lennart Pall, LCSW Nurse Present: Heather Roberts, RN PT Present: Raylene Everts, Rachel Moulds, PT OT Present: Other (comment) Napoleon Form, OT) SLP Present: Weston Anna, SLP PPS Coordinator present : Daiva Nakayama, RN, CRRN     Current Status/Progress Goal Weekly Team Focus  Medical   debility, chf, lower extremity edema, wound care issues  improve functional actitivy tolerance  wound care, edema, cv issues   Bowel/Bladder   Continent of bowel and bladder. LBM 6/14. Urinary urgency. Uses diaper.   To remain continent of bowel and bladder  Monitor q shift   Swallow/Nutrition/ Hydration             ADL's   S overall  Mod I overall; supervision functional transfers  Functional transfers; functional activity tolerance   Mobility  S overall  mod I overall  d/c planning, community reintegration, endurance, functional strengthening, gait, balance   Communication             Safety/Cognition/ Behavioral Observations  Min A-Supervision   Supervision  Family Education    Pain     oxycodone IR 5 mg q4 hr   Pain level 3 or less on a scale of 0-10.  Asses pain q 4-6 hrs and offer pain meds as needed.   Skin   open area to gluteall cleft and right groin. Daily dressing. Santyl ointmtnet applied.  No new skin breakdown/infection   Monitor skin q shift    Rehab Goals Patient on target to meet rehab goals: Yes *See Care Plan and progress notes for long and short-term goals.  Barriers to Discharge: edema, wound mgt    Possible Resolutions to Barriers:  education on wound care, family ed    Discharge Planning/Teaching Needs:  home with wife who can provide only minimal support due to her poor hearing and legally blind      Team Discussion:  Right groin and sacral wounds that will need HHRN.  Wife will need to learn dsg changes.  Wife remain intrusive and encouraging her to step out during therapies.  Pt and wife report cognition is at baseline.  Reaching mod i mobility goals.  Revisions to Treatment Plan:  None   Continued Need for Acute Rehabilitation Level of Care: The patient requires daily medical management by a physician with specialized training in physical medicine and rehabilitation for the following conditions: Daily direction of a multidisciplinary physical rehabilitation program to ensure safe treatment while eliciting the highest outcome that is of practical value to the patient.: Yes Daily medical management of patient stability for increased activity during participation in an intensive rehabilitation regime.: Yes Daily analysis of laboratory values and/or radiology reports with any subsequent need for medication adjustment of medical intervention for : Post surgical problems;Cardiac problems  Maikol Grassia 02/14/2015, 4:47 PM

## 2015-02-14 NOTE — Progress Notes (Signed)
Physical Therapy Session Note  Patient Details  Name: Lance Cook MRN: [id number] Date of Birth: February 09, 1961  Today's Date: 02/14/2015 PT Individual Time: 2637-8588 PT Individual Time Calculation (min): 75 min   Short Term Goals: Week 1:  PT Short Term Goal 1 (Week 1): STG=LTG due to ELOS  Skilled Therapeutic Interventions/Progress Updates:    Patient seen for initiating discharge assessments.  Patient ambulated with RW mod I 170'.  Performed car transfers mod I, supine<>sit mod I to flat bed no railings.  Patient negotiated 12 steps 6" in height with 2 rails step through technique mod I.  Patient performed floor transfer mod I and verbalized understanding of fall precautions and procedures should he fall.  Berg balance assesment performed, scored 50/56.  Patient propelled wheelchair off floor mod I propelling with UE vs LE's.  Gait on paved inclined surfaces with RW mod I 200'.  Patient assisted back in chair to room and left in room wife present.  Sign posted on door to indicate pt Mod I with mobility in the room with walker.  Wife and pt verbalized understanding, RN aware.  Therapy Documentation Precautions:  Precautions Precautions: Fall Restrictions Weight Bearing Restrictions: No Vital Signs: Therapy Vitals Pulse Rate: 99 BP: 137/65 mmHg Patient Position (if appropriate): Sitting Oxygen Therapy SpO2: 98 % O2 Device: Not Delivered Pain: Pain Assessment Pain Score: 5  Pain Type: Chronic pain Pain Location: Back Pain Onset: With Activity Pain Intervention(s): RN made aware Locomotion : Ambulation Ambulation: Yes Ambulation/Gait Assistance: 6: Modified independent (Device/Increase time) Ambulation Distance (Feet): 180 Feet Assistive device: Rolling walker Stairs / Additional Locomotion Stairs: Yes Stairs Assistance: 6: Modified independent (Device/Increase time) Stair Management Technique: Two rails Number of Stairs: 12 Height of Stairs: 6 Wheelchair  Mobility Wheelchair Mobility: Yes Wheelchair Assistance: 6: Modified independent (Device/Increase time) Environmental health practitioner: Both upper extremities Wheelchair Parts Management: Independent Distance: 150   Balance: Standardized Balance Assessment Standardized Balance Assessment: Financial controller Test Sit to Stand: Able to stand without using hands and stabilize independently Standing Unsupported: Able to stand safely 2 minutes Sitting with Back Unsupported but Feet Supported on Floor or Stool: Able to sit safely and securely 2 minutes Stand to Sit: Sits safely with minimal use of hands Transfers: Able to transfer safely, minor use of hands Standing Unsupported with Eyes Closed: Able to stand 10 seconds safely Standing Ubsupported with Feet Together: Able to place feet together independently and stand 1 minute safely From Standing, Reach Forward with Outstretched Arm: Can reach forward >12 cm safely (5") From Standing Position, Pick up Object from Floor: Able to pick up shoe safely and easily From Standing Position, Turn to Look Behind Over each Shoulder: Looks behind from both sides and weight shifts well Turn 360 Degrees: Able to turn 360 degrees safely one side only in 4 seconds or less Standing Unsupported, Alternately Place Feet on Step/Stool: Able to stand independently and safely and complete 8 steps in 20 seconds Standing Unsupported, One Foot in Front: Able to plae foot ahead of the other independently and hold 30 seconds Standing on One Leg: Tries to lift leg/unable to hold 3 seconds but remains standing independently Total Score: 50 Dynamic Sitting Balance Sitting balance - Comments: independent Static Standing Balance Static Standing - Balance Support: No upper extremity supported Static Standing - Level of Assistance: 7: Independent Dynamic Standing Balance Dynamic Standing - Balance Support: No upper extremity supported Dynamic Standing - Level of  Assistance: 7: Independent Dynamic Standing - Balance Activities:  Other (comment) (tapping alternate feet to 6"step)  See FIM for current functional status  Therapy/Group: Individual Therapy  [place], [place] 02/14/2015  02/14/2015, 9:49 AM

## 2015-02-14 NOTE — Progress Notes (Signed)
Lance Cook PHYSICAL MEDICINE & REHABILITATION     PROGRESS NOTE    Subjective/Complaints: Feels that lymphedema wraps helped somewhat---falling down however. Slept well. Felt a little run down with TID lasix dosing  ROS: Pt denies fever, rash/itching, headache, blurred or double vision, nausea, vomiting, abdominal pain, diarrhea, chest pain, shortness of breath, palpitations, dysuria, dizziness, neck or back pain, bleeding, or depression  Objective: Vital Signs: Blood pressure 167/63, pulse 80, temperature 98.9 F (37.2 C), temperature source Oral, resp. rate 16, weight 102.68 kg (226 lb 5.9 oz), SpO2 98 %. Dg Chest 2 View  02/13/2015   CLINICAL DATA:  Shortness of breath today, history hypertension, hyperlipidemia, type II diabetes, CHF, atrial fibrillation, coronary artery disease post MI, TIA, stroke  EXAM: CHEST  2 VIEW  COMPARISON:  02/08/2015  FINDINGS: RIGHT arm PICC line tip projects over SVC.  Enlargement of cardiac silhouette post MVR.  Pulmonary vascular congestion.  Pulmonary infiltrates asymmetrically greater on RIGHT likely persistent pulmonary edema, unchanged.  No pleural effusion or pneumothorax.  Bones unremarkable.  IMPRESSION: Persistent mild CHF, little changed.   Electronically Signed   By: Lavonia Dana M.D.   On: 02/13/2015 17:42    Recent Labs  02/12/15 0552  WBC 6.8  HGB 10.6*  HCT 34.7*  PLT 248    Recent Labs  02/12/15 0552 02/14/15 0440  NA 136 134*  K 3.6 3.9  CL 98* 98*  GLUCOSE 87 91  BUN 16 14  CREATININE 0.87 0.77  CALCIUM 8.1* 8.3*   CBG (last 3)   Recent Labs  02/13/15 1208 02/13/15 1625 02/14/15 0700  GLUCAP 85 101* 88    Wt Readings from Last 3 Encounters:  02/13/15 102.68 kg (226 lb 5.9 oz)  02/08/15 104.826 kg (231 lb 1.6 oz)  01/22/15 111.041 kg (244 lb 12.8 oz)    Physical Exam:  Constitutional: He is oriented to person, place, and time. He appears well-developed and well-nourished.  Morbidly obese male.  HENT:   Head: Normocephalic and atraumatic.  Eyes: Conjunctivae are normal. Pupils are equal, round, and reactive to light.  Neck: Normal range of motion. Neck supple.  Cardiovascular: Normal rate. An irregular rhythm is present  Positive valve click is present.  Respiratory: Effort normal and breath sounds normal. No respiratory distress. He has no wheezes or rales  GI: Soft. Bowel sounds are normal. There is no tenderness.  Musculoskeletal: Edema:  1+ to  2+ edema bilateral LE edema, left greater than right, wraps coming down from calf---right dressing TAPED TO SKIN. Neurological: He is alert and oriented to person, place, and time. Has basic insight and awareness Motor strength is 4/5 bilateral deltoid, biceps, triceps, grip 3 minus bilateral hip flexor 4 minus bilateral knee extensor 4 bilateral ankle dorsiflexor plantar flexor Sensation intact to light touch in bilateral upper and lower limbs  Skin: Skin is warm and dry.  Dry  skin bilateral shins. Intergluteal ares with abraded areas due to MASD. several wounds/ abrasions along right chest wall. One suture remains in chest. Psych: patient a little anxious, distracted  Assessment/Plan: 1. Functional deficits secondary to debility/encephalopathy after MVR which require 3+ hours per day of interdisciplinary therapy in a comprehensive inpatient rehab setting. Physiatrist is providing close team supervision and 24 hour management of active medical problems listed below. Physiatrist and rehab team continue to assess barriers to discharge/monitor patient progress toward functional and medical goals. FIM: FIM - Bathing Bathing Steps Patient Completed: Chest, Right Arm, Left Arm, Abdomen, Front perineal  area, Buttocks, Right upper leg, Left upper leg, Left lower leg (including foot), Right lower leg (including foot) Bathing: 5: Supervision: Safety issues/verbal cues  FIM - Upper Body Dressing/Undressing Upper body dressing/undressing steps  patient completed: Thread/unthread right sleeve of pullover shirt/dresss, Thread/unthread left sleeve of pullover shirt/dress, Put head through opening of pull over shirt/dress, Pull shirt over trunk Upper body dressing/undressing: 5: Set-up assist to: Obtain clothing/put away FIM - Lower Body Dressing/Undressing Lower body dressing/undressing steps patient completed: Thread/unthread right underwear leg, Thread/unthread left underwear leg, Pull underwear up/down, Thread/unthread right pants leg, Thread/unthread left pants leg, Pull pants up/down, Don/Doff left shoe Lower body dressing/undressing: 3: Mod-Patient completed 50-74% of tasks  FIM - Toileting Toileting steps completed by patient: Adjust clothing prior to toileting, Adjust clothing after toileting Toileting Assistive Devices: Grab bar or rail for support Toileting: 5: Supervision: Safety issues/verbal cues  FIM - Radio producer Devices: Insurance account manager Transfers: 5-From toilet/BSC: Supervision (verbal cues/safety issues), 5-To toilet/BSC: Supervision (verbal cues/safety issues)  FIM - Control and instrumentation engineer Devices: Environmental consultant, Arm rests Bed/Chair Transfer: 5: Chair or W/C > Bed: Supervision (verbal cues/safety issues), 5: Bed > Chair or W/C: Supervision (verbal cues/safety issues)  FIM - Locomotion: Wheelchair Distance: 110 Locomotion: Wheelchair: 1: Total Assistance/staff pushes wheelchair (Pt<25%) FIM - Locomotion: Ambulation Locomotion: Ambulation Assistive Devices: Psychologist, occupational: Ambulation: 5: Travels 150 ft or more with supervision/safety issues  Comprehension Comprehension Mode: Auditory Comprehension: 5-Understands basic 90% of the time/requires cueing < 10% of the time  Expression Expression Mode: Verbal Expression: 5-Expresses basic 90% of the time/requires cueing < 10% of the time.  Social Interaction Social Interaction: 5-Interacts appropriately  90% of the time - Needs monitoring or encouragement for participation or interaction.  Problem Solving Problem Solving: 5-Solves basic 90% of the time/requires cueing < 10% of the time  Memory Memory: 5-Requires cues to use assistive device Medical Problem List and Plan: 1. Functional deficits secondary to Deconditioning after mitral valve replacement 01/24/2015 in a patient with morbid obesity and postoperative encephalopathy 2. DVT Prophylaxis/Anticoagulation: Pharmaceutical: Coumadin per pharmacy--remains supratherapeutic 3. Chronic pain/ Pain Management: oxycodone and robaxin prn---doesn't appear to be in overt pain 4. Delirium/Mood: Resolving--Monitor with resumption of Buspar as well as restoril. LCSW to follow also  Has been emotionally stabile on rehab thus far 5. Neuropsych: This patient is capable of making decisions on his own behalf. 6. MASD sacrum/Skin/Wound Care: Routine pressure relief measures. Santyl with daily dressing changes to sacral MASD.   -education for pt/wife---HHRN to assist also 7. Fluids/Electrolytes/Nutrition: encourage PO. Labs reviewed and normal 8. Fluid overload with anasarca: Improving with diuresis. Continue to monitor daily weights. 9. Chronic diastolic CHF: checking daily weights. cxr without much change yesterday   -lasix adjusted to 40mg  bid per cards  -continue digoxin and atenolol.   -lymphedema wraps--remove as falling off and right leg was taped to skin.   -resume compression device at home  -appreciate cards assist. Echo ordered 10 PAF: Monitor heart rate every 8 hours. Continue atenolol, digoxin and coumadin.  11. OSA: Continue to use CPAP daily. 12. C diff colitis: Metronidazole completed. Questran works well for him 54. ABLA: hgb trending up 14. DM type 2: Diet controlled. Hgb A1C-5.9. sugars quite low.  Diet liberalized  -appreciate RD assist. + 14. Anxiety disorder: Buspar resumed---affect generally appropriate 16. Chronic  insomnia: Resumed Restoril. 17. Leucocytosis: Improving. No signs of infection.   18. Minimally invasive MVR: Monitor wound for healing. No need for sternal  precautions.  19. Chronic peripheral edema: RLE lymphedema wraps essentially falling off. LLE wrap partially off. Can continue with LLE wrap as long as it stays on.  -home with compression device    LOS (Days) 6 A FACE TO FACE EVALUATION WAS PERFORMED  Alaisha Eversley T 02/14/2015 8:16 AM

## 2015-02-15 ENCOUNTER — Telehealth: Payer: Self-pay | Admitting: Thoracic Surgery (Cardiothoracic Vascular Surgery)

## 2015-02-15 LAB — MAGNESIUM: MAGNESIUM: 1.7 mg/dL (ref 1.7–2.4)

## 2015-02-15 LAB — PROTIME-INR
INR: 2.84 — ABNORMAL HIGH (ref 0.00–1.49)
Prothrombin Time: 29.4 seconds — ABNORMAL HIGH (ref 11.6–15.2)

## 2015-02-15 LAB — GLUCOSE, CAPILLARY
Glucose-Capillary: 91 mg/dL (ref 65–99)
Glucose-Capillary: 84 mg/dL (ref 65–99)

## 2015-02-15 LAB — BRAIN NATRIURETIC PEPTIDE: B NATRIURETIC PEPTIDE 5: 216.4 pg/mL — AB (ref 0.0–100.0)

## 2015-02-15 LAB — BASIC METABOLIC PANEL
ANION GAP: 9 (ref 5–15)
BUN: 14 mg/dL (ref 6–20)
CO2: 30 mmol/L (ref 22–32)
CREATININE: 0.77 mg/dL (ref 0.61–1.24)
Calcium: 8.2 mg/dL — ABNORMAL LOW (ref 8.9–10.3)
Chloride: 96 mmol/L — ABNORMAL LOW (ref 101–111)
GFR calc non Af Amer: >60 mL/min (ref >60)
Glucose, Bld: 169 mg/dL — ABNORMAL HIGH (ref 65–99)
Potassium: 3.7 mmol/L (ref 3.5–5.1)
Sodium: 135 mmol/L (ref 135–145)

## 2015-02-15 MED ORDER — FUROSEMIDE 40 MG PO TABS: 40.0000 mg | ORAL_TABLET | Freq: Two times a day (BID) | ORAL | Status: DC

## 2015-02-15 MED ORDER — POTASSIUM CHLORIDE ER 10 MEQ PO TBCR: 20.0000 meq | EXTENDED_RELEASE_TABLET | Freq: Two times a day (BID) | ORAL | Status: DC

## 2015-02-15 MED ORDER — MAGNESIUM OXIDE 400 (241.3 MG) MG PO TABS: 400.0000 mg | ORAL_TABLET | Freq: Two times a day (BID) | ORAL | Status: DC

## 2015-02-15 MED ORDER — WARFARIN SODIUM 5 MG PO TABS
5.0000 mg | ORAL_TABLET | Freq: Once | ORAL | Status: DC
  Filled 2015-02-15: qty 1

## 2015-02-15 MED ORDER — ATENOLOL 50 MG PO TABS: 50.0000 mg | ORAL_TABLET | Freq: Two times a day (BID) | ORAL | Status: DC

## 2015-02-15 MED ORDER — ATENOLOL 100 MG PO TABS: 50.0000 mg | ORAL_TABLET | Freq: Two times a day (BID) | ORAL | Status: DC

## 2015-02-15 NOTE — Progress Notes (Signed)
Social Work  Discharge Note  The overall goal for the admission was met for:   Discharge location: Yes - home with wife who can provide overall supervision  Length of Stay: Yes - 7 days  Discharge activity level: Yes - modified independent  Home/community participation: Yes  Services provided included: MD, RD, PT, OT, SLP, RN, TR, Pharmacy and [place]: Medicare (*[employer] Medicare)  Follow-up services arranged: Home Health: RN, PT via [place], DME: rolling walker, tub seat via [facility] and Patient/Family has no preference for HH/DME agencies  Comments (or additional information):  Patient/Family verbalized understanding of follow-up arrangements: Yes  Individual responsible for coordination of the follow-up plan: pt  Confirmed correct DME delivered: Asa Fath 02/15/2015    Wayne Brunker

## 2015-02-15 NOTE — Progress Notes (Signed)
Recreational Therapy Discharge Summary Patient Details  Name: Lance Cook MRN: [id number] Date of Birth: [birth year] Today's Date: 02/15/2015  Long term goals set: 1  Long term goals met: 1  Comments on progress toward goals: Pt has made great progress toward goal and is ready for discharge home at supervision level for community pursuits.  Pt required use of motorized cart in community settings due to low activity tolerance.  Education provided on safety concerns in community environments in which pt states understanding.  Reasons for discharge: discharge from hospital   Patient/family agrees with progress made and goals achieved: Yes  Mickelle Goupil 02/15/2015, 8:17 AM

## 2015-02-15 NOTE — Discharge Summary (Signed)
Physician Discharge Summary  Patient ID: Lance Cook MRN: [id number] DOB/AGE: [birth year] 54 y.o.  Admit date: 02/08/2015 Discharge date: 02/15/2015  Discharge Diagnoses:  Principal Problem:   Physical deconditioning Active Problems:   Essential hypertension   PERIPHERAL EDEMA   Chronic diastolic congestive heart failure   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   Enteritis due to Clostridium difficile   Encephalopathy acute   Discharged Condition: Stable.    Labs:  Basic Metabolic Panel:  Recent Labs Lab 02/09/15 0507 02/12/15 0552 02/14/15 0440  NA 138 136 134*  K 3.6 3.6 3.9  CL 100* 98* 98*  CO2 32 30 29  GLUCOSE 94 87 91  BUN 15 16 14   CREATININE 0.79 0.87 0.77  CALCIUM 8.3* 8.1* 8.3*  MG 1.9  --  1.6*  PHOS 3.5  --   --     CBC:  Recent Labs Lab 02/09/15 0507 02/12/15 0552  WBC 9.6 6.8  NEUTROABS 8.0*  --   HGB 10.7* 10.6*  HCT 36.0* 34.7*  MCV 86.1 85.3  PLT 270 248    CBG:  Recent Labs Lab 02/14/15 0700 02/14/15 1129 02/14/15 1617 02/14/15 2111 02/15/15 0641  GLUCAP 88 119* 96 123* 91     Brief HPI:   Lance Cook is a 54 y.o. male with history of CAF, PVD, TIA's, CAD s/p CAD with AVR '99, severe MR who was admitted for minimally invasive MVR on 01/24/15 by Dr. Roxy Manns. Post op with hypoxemia due to edema v/s ARDS and required prolonged vent support. He was treated with aggressive diuresis as well as IV antibiotics for possible HCAP. He has had issues with mucous plugging as well as c diff colitis and was started on metronidazole on 05/31. He tolerated extubation with prn BIPAP use on 06/03. He has had worsening of ICU delirium with confusion and hallucinations 6/05 and Risperdal was added with improvement in mentation. Fluid overload is improving with diuresis and he's is being weaned off oxygen. Rectal tube d/c as diarrhea has resolved. A fib is rate controlled. Patient noted to be severely deconditioned and CIR was  recommended by MD and Rehab team.    Hospital Course: Lance Cook was admitted to rehab 02/08/2015 for inpatient therapies to consist of PT, ST and OT at least three hours five days a week. Past admission physiatrist, therapy team and rehab RN have worked together to provide customized collaborative inpatient rehab. Blood pressures have been reasonably controlled.  Diabetes has been monitored with ac/hs checks and blood sugars were noted to be quite low therefore diet was liberalized to help with increase in intake.  Chronic pain has been managed with prn use of oxycodone.  Weights were monitored daily and this was noted to be on upward trend with increase in peripheral edema. Cardiology was consulted for input and recommended increasing Lasix to 40 mg tid as well as lymphedema wraps for BLE fluid control. this was reduced to bid with downward trend in weights. Lytes have been followed closely and Mag-Ox was added due to low magnesium levels. He is to continue on lasix 40 bid and follow up with cardiology for further adjustment past discharge. Encephalopathy has resolved and he was weaned off Risperdal. He has completed 14 day course of flagyl for treatment of c diff and diarrhea has resolved.  Mood has been stable without signs of anxiety. Po intake has been good and he's continent of bowel and bladder. Lymphedema wraps were d/c at discharge and  he's to use home compression device 1-2 times daily past discharge.  He has made good progress during his rehab stay and is independent at discharge. He will continue to receive follow up HHPT and [place] by [place] past discharge.    Rehab course: During patient's stay in rehab weekly team conferences were held to monitor patient's progress, set goals and discuss barriers to discharge. At admission, patient required min assist with mobility and basic self care tasks. Cognitive evaluation revealed deficits in recall, emergent safety awareness, problem solving and  selective attention. He has had improvement in activity tolerance, balance, postural control, as well as ability to compensate for deficits.  He is modified independent for mobility. He is able to complete bathing and dressing tasks independently. He is able to complete mildly complex and familiar tasks at modified independent to supervision level. Cognition is at baseline and no follow up ST needed past discharge.     Disposition: Home   Diet: Cardiac diet. Limit carbs/sweets.   Special Instructions: 1. No strenuous activity. No driving till cleared by MD.     Medication List    STOP taking these medications        busPIRone 7.5 MG tablet  Commonly known as:  BUSPAR     clindamycin 300 MG capsule  Commonly known as:  CLEOCIN     collagenase ointment  Commonly known as:  SANTYL     Gerhardt's butt cream Crea     metroNIDAZOLE 500 MG tablet  Commonly known as:  FLAGYL     risperiDONE 0.25 MG tablet  Commonly known as:  RISPERDAL      TAKE these medications        allopurinol 300 MG tablet  Commonly known as:  ZYLOPRIM  Take 300 mg by mouth daily.     aspirin 81 MG EC tablet  Take 1 tablet (81 mg total) by mouth daily.     atenolol 100 MG tablet  Commonly known as:  TENORMIN  Take 0.5 tablets (50 mg total) by mouth 2 (two) times daily.     b complex vitamins tablet  Take 1 tablet by mouth daily.     CAL-MAG-ZINC PO  Take by mouth daily.     digoxin 0.25 MG tablet  Commonly known as:  LANOXIN  Take 1 tablet (0.25 mg total) by mouth daily.     furosemide 40 MG tablet  Commonly known as:  LASIX  Take 1 tablet (40 mg total) by mouth 2 (two) times daily. Take with breakfast and after lunch daily     magnesium oxide 400 (241.3 MG) MG tablet  Commonly known as:  MAG-OX  Take 1 tablet (400 mg total) by mouth 2 (two) times daily.     methocarbamol 500 MG tablet  Commonly known as:  ROBAXIN  Take 500 mg by mouth 3 (three) times daily.     ONE TOUCH ULTRA TEST  test strip  Generic drug:  glucose blood     oxyCODONE 5 MG immediate release tablet  Commonly known as:  Oxy IR/ROXICODONE  Take 5 mg by mouth every 4 (four) hours as needed for moderate pain.     potassium chloride 10 MEQ tablet  Commonly known as:  K-DUR  Take 2 tablets (20 mEq total) by mouth 2 (two) times daily.     temazepam 15 MG capsule  Commonly known as:  RESTORIL  Take 15 mg by mouth at bedtime as needed for sleep.  warfarin 5 MG tablet  Commonly known as:  COUMADIN  Take 1 to 1.5 tablets by mouth daily as directed by coumadin clinic       Follow-up Information    Call Meredith Staggers, MD.   Specialty:  Physical Medicine and Rehabilitation   Why:  As needed   Contact information:   [address], [place] [address] [phone]       Follow up with Rexene Alberts, MD On 02/26/2015.   Specialty:  Cardiothoracic Surgery   Why:  Be there at 1 pm for follow up appointment   Contact information:   [address] [place] [address] [phone]       Follow up with SPENCER,SARA C, PA-C On 03/01/2015.   Specialty:  Physician Assistant   Why:  @ 3:15 PM   Contact information:   [place] [address] [phone]       Follow up with Truitt Merle, NP On 03/21/2015.   Specialties:  Nurse Practitioner, Interventional Cardiology, Cardiology, Radiology   Why:  @10 :30 post hospital cardiology   Contact information:   [address] [place] [address] [phone]       Signed: Bary Leriche 02/16/2015, 5:28 PM

## 2015-02-15 NOTE — Progress Notes (Signed)
ANTICOAGULATION CONSULT NOTE - Follow Up Consult  Pharmacy Consult for coumadin Indication: afib and mechanical MVR  Allergies  Allergen Reactions  . Penicillins     REACTION: Hives  . Adhesive [Tape] Other (See Comments)    Shin irritation    Patient Measurements: Weight: 227 lb 1.6 oz (103.012 kg) Heparin Dosing Weight:   Vital Signs: Temp: 97.8 F (36.6 C) (06/16 0601) Temp Source: Oral (06/16 0601) BP: 103/70 mmHg (06/16 0601) Pulse Rate: 81 (06/16 0601)  Labs:  Recent Labs  02/13/15 0505 02/14/15 0440 02/15/15 0545  LABPROT 30.1* 27.4* 29.4*  INR 2.94* 2.59* 2.84*  CREATININE  --  0.77  --     Estimated Creatinine Clearance: 109.7 mL/min (by C-G formula based on Cr of 0.77).   Medications:  Scheduled:  . allopurinol  300 mg Oral Daily  . aspirin EC  81 mg Oral Daily  . atenolol  50 mg Oral BID  . cholestyramine light  4 g Oral Q0600  . collagenase   Topical Daily  . digoxin  0.25 mg Oral Daily  . feeding supplement (PRO-STAT SUGAR FREE 64)  30 mL Oral BID  . furosemide  40 mg Oral BID  . insulin aspart  0-20 Units Subcutaneous TID WC  . magnesium oxide  400 mg Oral BID  . potassium chloride  20 mEq Oral BID  . risperiDONE  0.25 mg Oral QHS  . sodium chloride  10-40 mL Intracatheter Q12H  . Warfarin - Pharmacist Dosing Inpatient   Does not apply q1800   Infusions:    Assessment: 54 yo male with afib and mechanical MVR is currently on therapeutic coumadin.  INR is up to 2.84 from 2.59.   Goal of Therapy:  INR 2.5-3.5 Monitor platelets by anticoagulation protocol: Yes   Plan:  - coumadin 5 mg po x1 - INR In am  Hiroyuki Ozanich, Tsz-[place]in 02/15/2015,8:05 AM

## 2015-02-15 NOTE — Telephone Encounter (Signed)
Lance Cook called to inform us that he took a 100 mg Atenolol tablet instead of the 50 mg tablet as prescribed.  He denies any lightheadedness, dizziness, or slow HR.  I reassured him that he should be fine.  He has a BP cuff at home.  He will check pulse and BP hourly x 6 hours and call if he notes HR < 50, SBP < 100 or has any symptoms

## 2015-02-15 NOTE — Progress Notes (Signed)
[facility] PHYSICAL MEDICINE & REHABILITATION     PROGRESS NOTE    Subjective/Complaints: Ready to go home. No complaints. Asked me if i would consider managing his pain as an outpt  ROS: Pt denies fever, rash/itching, headache, blurred or double vision, nausea, vomiting, abdominal pain, diarrhea, chest pain, shortness of breath, palpitations, dysuria, dizziness, neck or back pain, bleeding, or depression  Objective: Vital Signs: Blood pressure 103/70, pulse 81, temperature 97.8 F (36.6 C), temperature source Oral, resp. rate 18, weight 103.012 kg (227 lb 1.6 oz), SpO2 98 %. Dg Chest 2 View  02/13/2015   CLINICAL DATA:  Shortness of breath today, history hypertension, hyperlipidemia, type II diabetes, CHF, atrial fibrillation, coronary artery disease post MI, TIA, stroke  EXAM: CHEST  2 VIEW  COMPARISON:  02/08/2015  FINDINGS: RIGHT arm PICC line tip projects over SVC.  Enlargement of cardiac silhouette post MVR.  Pulmonary vascular congestion.  Pulmonary infiltrates asymmetrically greater on RIGHT likely persistent pulmonary edema, unchanged.  No pleural effusion or pneumothorax.  Bones unremarkable.  IMPRESSION: Persistent mild CHF, little changed.   Electronically Signed   By: Lavonia Dana M.D.   On: 02/13/2015 17:42   No results for input(s): WBC, HGB, HCT, PLT in the last 72 hours.  Recent Labs  02/14/15 0440  NA 134*  K 3.9  CL 98*  GLUCOSE 91  BUN 14  CREATININE 0.77  CALCIUM 8.3*   CBG (last 3)   Recent Labs  02/14/15 1617 02/14/15 2111 02/15/15 0641  GLUCAP 96 123* 91    Wt Readings from Last 3 Encounters:  02/14/15 103.012 kg (227 lb 1.6 oz)  02/08/15 104.826 kg (231 lb 1.6 oz)  01/22/15 111.041 kg (244 lb 12.8 oz)    Physical Exam:  Constitutional: He is oriented to person, place, and time. He appears well-developed and well-nourished.  Morbidly obese male.  HENT:  Head: Normocephalic and atraumatic.  Eyes: Conjunctivae are normal. Pupils are equal,  round, and reactive to light.  Neck: Normal range of motion. Neck supple.  Cardiovascular: Normal rate. An irregular rhythm is present  Positive valve click  present.  Respiratory: Effort normal and breath sounds normal. No respiratory distress. He has no wheezes or rales  GI: Soft. Bowel sounds are normal. There is no tenderness.  Musculoskeletal: Edema:  1+ to  2+ edema bilateral LE edema, left greater than right---stable Neurological: He is alert and oriented to person, place, and time. Has basic insight and awareness Motor strength is 4/5 bilateral deltoid, biceps, triceps, grip 3 minus bilateral hip flexor 4 minus bilateral knee extensor 4 bilateral ankle dorsiflexor plantar flexor Sensation intact to light touch in bilateral upper and lower limbs  Skin: Skin is warm and dry.  Dry  skin bilateral shins. Intergluteal areas MASD. several wounds/ abrasions along right chest wall. One suture remains in chest. Psych: patient a little anxious, distracted  Assessment/Plan: 1. Functional deficits secondary to debility/encephalopathy after MVR which require 3+ hours per day of interdisciplinary therapy in a comprehensive inpatient rehab setting. Physiatrist is providing close team supervision and 24 hour management of active medical problems listed below. Physiatrist and rehab team continue to assess barriers to discharge/monitor patient progress toward functional and medical goals. FIM: FIM - Bathing Bathing Steps Patient Completed: Chest, Right Arm, Left Arm, Abdomen, Front perineal area, Buttocks, Right upper leg, Left upper leg, Left lower leg (including foot), Right lower leg (including foot) Bathing: 6: More than reasonable amount of time  FIM - Upper Body  Dressing/Undressing Upper body dressing/undressing steps patient completed: Thread/unthread right sleeve of pullover shirt/dresss, Thread/unthread left sleeve of pullover shirt/dress, Put head through opening of pull over  shirt/dress, Pull shirt over trunk Upper body dressing/undressing: 6: More than reasonable amount of time FIM - Lower Body Dressing/Undressing Lower body dressing/undressing steps patient completed: Thread/unthread right underwear leg, Thread/unthread left underwear leg, Pull underwear up/down, Thread/unthread right pants leg, Thread/unthread left pants leg, Pull pants up/down, Don/Doff left shoe, Don/Doff left sock, Don/Doff right shoe, Fasten/unfasten right shoe, Fasten/unfasten left shoe, Don/Doff right sock Lower body dressing/undressing: 5: Supervision: Safety issues/verbal cues  FIM - Toileting Toileting steps completed by patient: Adjust clothing prior to toileting, Adjust clothing after toileting, Performs perineal hygiene Toileting Assistive Devices: Grab bar or rail for support Toileting: 6: More than reasonable amount of time  FIM - Radio producer Devices: Insurance account manager Transfers: 6-Assistive device: No helper  FIM - Control and instrumentation engineer Devices: Environmental consultant, Arm rests Bed/Chair Transfer: 7: Independent: No helper, 7: Sit > Supine: No assist, 7: Bed > Chair or W/C: No assist, 7: Chair or W/C > Bed: No assist  FIM - Locomotion: Wheelchair Distance: 150 Locomotion: Wheelchair: 6: Travels 150 ft or more, turns around, maneuvers to table, bed or toilet, negotiates 3% grade: maneuvers on rugs and over door sills independently FIM - Locomotion: Ambulation Locomotion: Ambulation Assistive Devices: Administrator Ambulation/Gait Assistance: 6: Modified independent (Device/Increase time) Locomotion: Ambulation: 6: Travels 150 ft or more with assistive device/no helper  Comprehension Comprehension Mode: Auditory Comprehension: 5-Understands basic 90% of the time/requires cueing < 10% of the time  Expression Expression Mode: Verbal Expression: 5-Expresses basic 90% of the time/requires cueing < 10% of the time.  Social  Interaction Social Interaction: 5-Interacts appropriately 90% of the time - Needs monitoring or encouragement for participation or interaction.  Problem Solving Problem Solving: 5-Solves basic 90% of the time/requires cueing < 10% of the time  Memory Memory: 5-Requires cues to use assistive device Medical Problem List and Plan: 1. Functional deficits secondary to Deconditioning after mitral valve replacement 01/24/2015 in a patient with morbid obesity and postoperative encephalopathy 2. DVT Prophylaxis/Anticoagulation: Pharmaceutical: Coumadin per pharmacy--remains supratherapeutic 3. Chronic pain/ Pain Management: oxycodone and robaxin prn---doesn't appear to be in overt pain 4. Delirium/Mood: Resolving--Monitor with resumption of Buspar as well as restoril. LCSW to follow also  Has been emotionally stabile on rehab thus far 5. Neuropsych: This patient is capable of making decisions on his own behalf. 6. MASD sacrum/Skin/Wound Care: Routine pressure relief measures. Santyl with daily dressing changes to sacral MASD.   -education for pt/wife---HHRN to assist also 7. Fluids/Electrolytes/Nutrition: encourage PO. Labs reviewed and normal 8. Fluid overload with anasarca: Improving with diuresis. Continue to monitor daily weights. 9. Chronic diastolic CHF: checking daily weights. (103kg) cxr without much change     -lasix adjusted to 40mg  bid per cards. Labs pending today  -continue digoxin and atenolol.   -resume compression device at home  -appreciate cards assist. Echo noted 10 PAF: Monitor heart rate every 8 hours. Continue atenolol, digoxin and coumadin.  11. OSA: Continue to use CPAP daily. 12. C diff colitis: Metronidazole completed. Questran works well for him 75. ABLA: hgb trending up 14. DM type 2: Diet controlled. Hgb A1C-5.9. sugars quite low.  Diet liberalized  -appreciate RD assist.  14. Anxiety disorder: Buspar resumed---affect generally appropriate 16. Chronic  insomnia: Resumed Restoril. 17. Leucocytosis: Improving. No signs of infection.   18. Minimally invasive MVR: Monitor wound  for healing. No need for sternal precautions.  19. Chronic peripheral edema:   -home with compression device he's accustomed to   LOS (Days) 7 A FACE TO FACE EVALUATION WAS PERFORMED  Judy Goodenow T 02/15/2015 7:48 AM

## 2015-02-15 NOTE — Progress Notes (Signed)
Pt. Got d/c instructions and follow up medications.Pt. Is ready to go home with his wife.

## 2015-02-15 NOTE — Progress Notes (Signed)
PICC line d/c'ed per order.  Site cleaned with CHG,covered with vaseline guaze and dry 2x2.  Verbalizes understanding of drsg care, signs of infection and whento call docyor via teach back method.  No ADN.

## 2015-02-20 ENCOUNTER — Ambulatory Visit (INDEPENDENT_AMBULATORY_CARE_PROVIDER_SITE_OTHER): Payer: Medicare HMO | Admitting: Cardiovascular Disease

## 2015-02-20 DIAGNOSIS — Z5181 Encounter for therapeutic drug level monitoring: Secondary | ICD-10-CM

## 2015-02-20 DIAGNOSIS — I4819 Other persistent atrial fibrillation: Secondary | ICD-10-CM

## 2015-02-20 DIAGNOSIS — I481 Persistent atrial fibrillation: Secondary | ICD-10-CM

## 2015-02-20 DIAGNOSIS — Z954 Presence of other heart-valve replacement: Secondary | ICD-10-CM

## 2015-02-20 DIAGNOSIS — Z952 Presence of prosthetic heart valve: Secondary | ICD-10-CM

## 2015-02-20 LAB — POCT INR: INR: 2.1

## 2015-02-20 NOTE — H&P (Signed)
This report was copied and pasted into the rehab chart so that it's in the appropriate encounter  Physical Medicine and Rehabilitation Admission H&P   CC: Deconditioning.   HPI: Lance Cook is a 54 y.o. male with history of CAF, PVD, TIA's, CAD s/p CAD with AVR '99, severe MR who was admitted for minimally invasive MVR on 01/24/15 by Dr. Roxy Manns. Post op with hypoxemia due to edema v/s ARDS and required prolonged vent support. He was treated with aggressive diuresis as well as IV antibiotics for possible HCAP. He has had issues with mucous plugging as well as c diff colitis and was started on metronidazole on 05/31. He tolerated extubation with prn BIPAP use on 06/03. He has had worsening of ICU delirium with confusion and hallucinations 6/05 and Risperdal was added with improvement in mentation. Fluid overload is improving with diuresis and he's is being weaned off oxygen. Rectal tube d/c as diarrhea has resolved. A fib is rate controlled. Patient noted to be severely deconditioned and CIR was recommended by MD and Rehab team.   Patient seen at bedside with his wife present. Review of Systems  HENT: Negative for hearing loss.  Eyes: Negative for double vision.  Respiratory: Negative for cough and shortness of breath.  Cardiovascular: Positive for leg swelling (at baseline currently). Negative for chest pain and palpitations.  Gastrointestinal: Positive for diarrhea. Negative for nausea, vomiting and abdominal pain.   Buttock pain--still feels like "the tube is in there"  Genitourinary: Positive for urgency and frequency.  Musculoskeletal: Positive for myalgias and back pain (chronic).  Neurological: Positive for sensory change. Negative for dizziness, speech change and headaches.  Endo/Heme/Allergies: Bruises/bleeds easily.  Psychiatric/Behavioral: Positive for memory loss. The patient is nervous/anxious and has insomnia.     Past Medical History  Diagnosis  Date  . Hyperlipidemia   . Hypertension   . Arthritis   . Leg pain 06/28/2010  . Hiatal hernia   . Atrial fibrillation     chronic persistent  . Reflux 11/06/09  . Myocardial infarction age 73  . Varicose veins   . Obesity   . Peripheral vascular disease   . Gout   . CAD (coronary artery disease)     Old scar inferior wall myoview, 10/2009 EF 52%  . Chronic LBP 10/08/2011  . Impaired glucose tolerance 10/08/2011  . Erectile dysfunction 10/08/2011  . Testicular cancer   . Bell's palsy   . TIA (transient ischemic attack)     age 53  . CVA (cerebral infarction)     54yo  . OSA (obstructive sleep apnea)     CPAP  . History of colon polyps   . Anemia   . Hypogonadism male 04/08/2012  . Aortic aneurysm   . Aortic aneurysm and dissection   . 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     1988 - Dr Blase Mess at [facility] in [place], [place]  . Ascending aortic dissection 07/14/2008    Localized dissection of ascending aorta noted on CTA in 2009 and stable on CTA in 2011  . Severe mitral regurgitation 10/11/2014  . Morbid obesity 04/02/2014  . Diabetes mellitus     diet controlled  . DIABETES MELLITUS, TYPE II 11/01/2009    Qualifier: Diagnosis of By: [name] MD, [name]   . Dysrhythmia   . CHF (congestive heart failure)   . Shortness of breath dyspnea     with exertion  . Anxiety   . GERD (gastroesophageal reflux  disease)     not needing medication at thhis time- 01/22/15  . Stroke     TIA and Stroke no residual effect- 1st was 60  . Chronic diastolic congestive heart failure   . S/P minimally invasive mitral valve replacement with  metallic valve 1/95/0932    33 mm St Jude bileaflet mechanical valve placed via right mini thoracotomy approach    Past Surgical History  Procedure Laterality Date  . Laser ablation  03/06/2010    left leg  . Cardiac valve replacement      [id number]  . Cholecystectomy  2011  . Tonsillectomy  1967  . Otoplasty      bilateral, age 15  . Orchiectomy  age 4    testicular cancer  . Tee without cardioversion N/A 10/11/2014    Procedure: TRANSESOPHAGEAL ECHOCARDIOGRAM (TEE); Surgeon: Pixie Casino, MD; Location: [facility] ENDOSCOPY; Service: Cardiovascular; Laterality: N/ADeneen Harts procedure  1988    25 mm St Jude mechanical valve conduit - Dr Blase Mess at [facility] in [place], [place]  . Coronary artery bypass graft    . Aortic truck    . Tee without cardioversion N/A 01/24/2015    Procedure: TRANSESOPHAGEAL ECHOCARDIOGRAM (TEE); Surgeon: Rexene Alberts, MD; Location: [place]; Service: Open Heart Surgery; Laterality: N/A;  . Mitral valve replacement Right 01/24/2015    Procedure: Re-Operation, MINIMALLY INVASIVE MITRAL VALVE (MV) REPLACEMENT.; Surgeon: Rexene Alberts, MD; Location: [place]; Service: Open Heart Surgery; Laterality: Right;    Family History  Problem Relation Age of Onset  . Heart disease Mother   . Throat cancer Mother   . Diabetes Father   . Stroke Other   . Hypertension Other   . Cancer Maternal Aunt     lung, brain  . Cancer Maternal Uncle     brain aneurysm    Social History: Married. Independent but disabled due to medical issues. Wife has health problems and does not drive. Per reports that he has been smoking Cigars. He has never used smokeless tobacco. He reports that he drinks alcohol. He reports that he  does not use illicit drugs.    Allergies  Allergen Reactions  . Penicillins     REACTION: Hives  . Adhesive [Tape] Other (See Comments)    Shin irritation    Medications Prior to Admission  Medication Sig Dispense Refill  . allopurinol (ZYLOPRIM) 300 MG tablet Take 300 mg by mouth daily.    Marland Kitchen atenolol (TENORMIN) 100 MG tablet Take 100 mg by mouth 2 (two) times daily.    Marland Kitchen b complex vitamins tablet Take 1 tablet by mouth daily.     . busPIRone (BUSPAR) 7.5 MG tablet Take 7.5 mg by mouth 2 (two) times daily.     . Calcium-Magnesium-Zinc (CAL-MAG-ZINC PO) Take by mouth daily.     Marland Kitchen CARTIA XT 120 MG 24 hr capsule Take 120 mg by mouth daily.     . clindamycin (CLEOCIN) 300 MG capsule Take 2 capsules by mouth one hour prior to dental appointment. 2 capsule 1  . digoxin (LANOXIN) 0.25 MG tablet Take 1 tablet (0.25 mg total) by mouth daily. 90 tablet 3  . enoxaparin (LOVENOX) 100 MG/ML injection Inject 1 mL (100 mg total) into the skin every 12 (twelve) hours. 20 Syringe 0  . furosemide (LASIX) 40 MG tablet Take 20-40 mg by mouth daily as needed for fluid.    . methocarbamol (ROBAXIN) 500 MG tablet Take 500 mg by mouth 3 (three) times  daily.     . ONE TOUCH ULTRA TEST test strip     . oxyCODONE (OXY IR/ROXICODONE) 5 MG immediate release tablet Take 5 mg by mouth every 4 (four) hours as needed for moderate pain.     . potassium chloride (K-DUR) 10 MEQ tablet Take 10 mEq by mouth 2 (two) times daily.     . temazepam (RESTORIL) 15 MG capsule Take 15 mg by mouth at bedtime as needed for sleep.    Marland Kitchen warfarin (COUMADIN) 5 MG tablet Take 1 to 1.5 tablets by mouth daily as directed by coumadin clinic (Patient taking differently: Take 5-7.5 mg by mouth See admin instructions. 7.55m on Wednesdays, 535mall  other days) 120 tablet 1    [place] to[place] Private residence Living Arrangements: Spouse/significant other Available Help at Discharge: Family, Available 24 hours/day Type of Home: Mobile home Home Access: Stairs to enter [employer] Steps: 5-6 Entrance Stairs-Rails: Right Home Layout: One level Additional Comments: Pt reports that wife is 4'9" and can oly do minimal lifting. She has visual deficits and pt has to help with with visual tasks. He is the driver in the family. They have investigated public transportation, but [employer] [place] not service their area of the county   Functional History: Prior Function Level of Independence: Needs assistance Gait / Transfers Assistance Needed: per pt walked household distances only; used motorized cart at the store ADL's / Homemaking Assistance Needed: Pt reports wife assisted him with LB dressing  Comments: Pt with intermittent confusion. Wife not present to confirm information   Functional Status:  Mobility: Bed Mobility Overal bed mobility: Needs Assistance, +2 for physical assistance, + 2 for safety/equipment Bed Mobility: Supine to Sit Supine to sit: Min assist, HOB elevated General bed mobility comments: Pt up in chair and returned to chair Transfers Overall transfer level: Needs assistance Equipment used: Rolling walker (2 wheeled) Transfers: Sit to/from Stand Sit to Stand: Min assist, +2 safety/equipment Stand pivot transfers: Mod assist, +2 physical assistance, +2 safety/equipment General transfer comment: vc for safe use of RW; transfer x 3 with cues needed each time Ambulation/Gait Ambulation/Gait assistance: Min assist, +2 safety/equipment Ambulation Distance (Feet): 100 Feet (seated rest, 100, sit, 100) Assistive device: Rolling walker (2 wheeled) General Gait Details: flexed posture, vc for safe use of RW; steady assist with occasional stagger/drift  to his left Gait Pattern/deviations: Step-through pattern, Decreased stride length, Staggering left, Drifts right/left, Trunk flexed Gait velocity: decr   ADL: ADL Overall ADL's : Needs assistance/impaired Eating/Feeding: Independent, Sitting Grooming: Wash/dry hands, Wash/dry face, Oral care, Set up, Sitting Upper Body Bathing: Minimal assitance, Sitting Lower Body Bathing: Sit to/from stand, Maximal assistance Upper Body Dressing : Moderate assistance, Sitting Lower Body Dressing: Maximal assistance, Sit to/from stand Lower Body Dressing Details (indicate cue type and reason): Pt able to bed forward to push sock off of heel  Toilet Transfer: Minimal assistance, +2 for safety/equipment Toilet Transfer Details (indicate cue type and reason): Pt is mildly impulsive. Requires cues and assist for safety.  Toileting- Clothing Manipulation and Hygiene: Maximal assistance, Sit to/from stand Toileting - Clothing Manipulation Details (indicate cue type and reason): Pt incontinent of stool while using urinal. Assist for peri care  Functional mobility during ADLs: Minimal assistance, +2 for safety/equipment  Cognition: Cognition Overall Cognitive Status: Impaired/Different from baseline Orientation Level: Oriented to person, Oriented to place, Oriented to situation Cognition Arousal/Alertness: Awake/alert Behavior During Therapy: [facility] assessed/performed Overall Cognitive Status:  Impaired/Different from baseline Area of Impairment: Safety/judgement Safety/Judgement: Decreased awareness of safety, Decreased awareness of deficits General Comments: Pt with intermittent confusion   Physical Exam: Blood pressure 110/69, pulse 83, temperature 98.3 F (36.8 C), temperature source Oral, resp. rate 20, height 5' 1"  (1.549 m), weight 104.826 kg (231 lb 1.6 oz), SpO2 91 %. Physical Exam  Nursing note and vitals reviewed. Constitutional: He is oriented to person, place, and time. He  appears well-developed and well-nourished.  Morbidly obese male.  HENT:  Head: Normocephalic and atraumatic.  Eyes: Conjunctivae are normal. Pupils are equal, round, and reactive to light.  Neck: Normal range of motion. Neck supple.  Cardiovascular: Normal rate. An irregular rhythm present.  Positive valve click .  Respiratory: Effort normal and breath sounds normal. No respiratory distress. He has no wheezes.  GI: Soft. Bowel sounds are normal. There is no tenderness.  Musculoskeletal: Edema: 2+ edema. Bilateral shins with stasis dermatitis.  Neurological: He is alert and oriented to person, place, and time.  Able to follow basic commands without difficulty. Impulsive.  Motor strength is 4/5 bilateral deltoid, biceps, triceps, grip 3 minus bilateral hip flexor 4 minus bilateral knee extensor 4 bilateral ankle dorsiflexor plantar flexor Sensation intact to light touch in bilateral upper and lower limbs  Skin: Skin is warm and dry.  Dry flaky skin bilateral shins. Intergluteal ares with abraded areas due to MASD.     Lab Results Last 48 Hours    Results for orders placed or performed during the hospital encounter of 01/24/15 (from the past 48 hour(s))  Glucose, capillary Status: None   Collection Time: 02/06/15 1:26 PM  Result Value Ref Range   Glucose-Capillary 87 65 - 99 mg/dL   Comment 1 Capillary Specimen    Comment 2 Notify RN   Glucose, capillary Status: Abnormal   Collection Time: 02/06/15 4:18 PM  Result Value Ref Range   Glucose-Capillary 126 (H) 65 - 99 mg/dL   Comment 1 Capillary Specimen    Comment 2 Notify RN   Glucose, capillary Status: None   Collection Time: 02/06/15 9:36 PM  Result Value Ref Range   Glucose-Capillary 89 65 - 99 mg/dL   Comment 1 Capillary Specimen    Comment 2 Notify RN    Comment 3 Document in  Chart   Protime-INR Status: Abnormal   Collection Time: 02/07/15 3:00 AM  Result Value Ref Range   Prothrombin Time 26.4 (H) 11.6 - 15.2 seconds   INR 2.47 (H) 0.00 - 1.49  Magnesium Status: None   Collection Time: 02/07/15 3:00 AM  Result Value Ref Range   Magnesium 1.8 1.7 - 2.4 mg/dL  Phosphorus Status: Abnormal   Collection Time: 02/07/15 3:00 AM  Result Value Ref Range   Phosphorus 2.4 (L) 2.5 - 4.6 mg/dL  Basic metabolic panel Status: Abnormal   Collection Time: 02/07/15 3:00 AM  Result Value Ref Range   Sodium 138 135 - 145 mmol/L   Potassium 4.0 3.5 - 5.1 mmol/L   Chloride 97 (L) 101 - 111 mmol/L   CO2 34 (H) 22 - 32 mmol/L   Glucose, Bld 85 65 - 99 mg/dL   BUN 15 6 - 20 mg/dL   Creatinine, Ser 0.78 0.61 - 1.24 mg/dL   Calcium 8.0 (L) 8.9 - 10.3 mg/dL   GFR calc non Af Amer >60 >60 mL/min   GFR calc Af Amer >60 >60 mL/min    Comment: (NOTE) The eGFR has been calculated using the CKD EPI equation.  This calculation has not been validated in all clinical situations. eGFR's persistently <60 mL/min signify possible Chronic Kidney Disease.    Anion gap 7 5 - 15  CBC Status: Abnormal   Collection Time: 02/07/15 3:00 AM  Result Value Ref Range   WBC 10.7 (H) 4.0 - 10.5 K/uL   RBC 3.97 (L) 4.22 - 5.81 MIL/uL   Hemoglobin 10.3 (L) 13.0 - 17.0 g/dL   HCT 34.2 (L) 39.0 - 52.0 %   MCV 86.1 78.0 - 100.0 fL   MCH 25.9 (L) 26.0 - 34.0 pg   MCHC 30.1 30.0 - 36.0 g/dL   RDW 17.7 (H) 11.5 - 15.5 %   Platelets 200 150 - 400 K/uL  Glucose, capillary Status: None   Collection Time: 02/07/15 8:54 AM  Result Value Ref Range   Glucose-Capillary 91 65 - 99 mg/dL   Comment  1 Capillary Specimen    Comment 2 Notify RN   Glucose, capillary Status: None   Collection Time: 02/07/15 1:21 PM  Result Value Ref Range   Glucose-Capillary 81 65 - 99 mg/dL  Glucose, capillary Status: Abnormal   Collection Time: 02/07/15 4:55 PM  Result Value Ref Range   Glucose-Capillary 115 (H) 65 - 99 mg/dL  Glucose, capillary Status: None   Collection Time: 02/07/15 9:22 PM  Result Value Ref Range   Glucose-Capillary 84 65 - 99 mg/dL  Protime-INR Status: Abnormal   Collection Time: 02/08/15 5:23 AM  Result Value Ref Range   Prothrombin Time 30.5 (H) 11.6 - 15.2 seconds   INR 2.98 (H) 0.00 - 1.49  Magnesium Status: None   Collection Time: 02/08/15 5:23 AM  Result Value Ref Range   Magnesium 1.8 1.7 - 2.4 mg/dL  Phosphorus Status: None   Collection Time: 02/08/15 5:23 AM  Result Value Ref Range   Phosphorus 2.9 2.5 - 4.6 mg/dL  Basic metabolic panel Status: Abnormal   Collection Time: 02/08/15 5:23 AM  Result Value Ref Range   Sodium 136 135 - 145 mmol/L   Potassium 3.9 3.5 - 5.1 mmol/L   Chloride 96 (L) 101 - 111 mmol/L   CO2 32 22 - 32 mmol/L   Glucose, Bld 107 (H) 65 - 99 mg/dL   BUN 14 6 - 20 mg/dL   Creatinine, Ser 0.82 0.61 - 1.24 mg/dL   Calcium 8.1 (L) 8.9 - 10.3 mg/dL   GFR calc non Af Amer >60 >60 mL/min   GFR calc Af Amer >60 >60 mL/min    Comment: (NOTE) The eGFR has been calculated using the CKD EPI equation. This calculation has not been validated in all clinical situations. eGFR's persistently <60 mL/min signify possible Chronic Kidney Disease.    Anion gap 8 5 - 15  Glucose, capillary Status: Abnormal   Collection Time: 02/08/15 5:44 AM  Result Value Ref  Range   Glucose-Capillary 112 (H) 65 - 99 mg/dL   Comment 1 Notify RN    Comment 2 Document in Chart   Glucose, capillary Status: None   Collection Time: 02/08/15 11:44 AM  Result Value Ref Range   Glucose-Capillary 79 65 - 99 mg/dL      Imaging Results (Last 48 hours)    Dg Chest 2 View  02/08/2015 CLINICAL DATA: CHF, mitral valve replacement EXAM: CHEST 2 VIEW COMPARISON: Portable chest x-ray of February 07, 2015 FINDINGS: The lungs are better inflated today although there is persistent mild hypo inflation. There is no visible right-sided pneumothorax. The pulmonary interstitial markings remain increased greater  in the right mid and lower lung than elsewhere. The cardiac silhouette remains enlarged. The central pulmonary vascularity is mildly engorged. There is a small right pleural effusion. There are 7 intact sternal wires. The mitral valve ring is visible. The right-sided PICC line tip projects over the midportion of the SVC. The bony thorax exhibits no acute abnormality. IMPRESSION: Slight improved aeration of both lungs. Persistent mild interstitial edema bilaterally secondary to CHF. Subsegmental atelectasis in the right mid and lower lung with small effusion is present and stable. Electronically Signed By: David Martinique M.D. On: 02/08/2015 07:40   Dg Chest Port 1 View  02/07/2015 CLINICAL DATA: Respiratory failure. EXAM: PORTABLE CHEST - 1 VIEW COMPARISON: 02/05/2015. FINDINGS: Interim removal of right chest tubes. Right PICC line in stable position. Tiny right pneumothorax. No tension. Prior median sternotomy and cardiac valve replacement. Stable cardiomegaly. Stable mild bilateral pulmonary interstitial prominence. Tiny right pleural effusion cannot be excluded. IMPRESSION: 1. Interim removal of right chest tubes. Tiny right pneumothorax. No tension. Right PICC line in stable position. 2. Prior cardiac valve  replacement. Stable severe cardiomegaly. 3. Stable mild bilateral pulmonary interstitial prominence. Mild interstitial edema cannot be excluded. Critical Value/emergent results were called by telephone at the time of interpretation on 02/07/2015 at 8:03 am to nurse [facility] verbally acknowledged these results. Electronically Signed By: Marcello Moores Register On: 02/07/2015 08:05        Medical Problem List and Plan: 1. Functional deficits secondary to Deconditioning after mitral valve replacement 01/24/2015 in a patient with morbid obesity and postoperative delirium 2. DVT Prophylaxis/Anticoagulation: Pharmaceutical: Coumadin 3. Chronic pain/ Pain Management: Was using oxycodone and robaxin at home. Currently rates pain as 2/10 but wants meds scheduled. Educated on using Appropriately. 4. Delirium/Mood: Resolving--question component of drug withdrawal. Monitor with resumption of Buspar as well as restoril. LCSW to follow for  5. Neuropsych: This patient is capable of making decisions on his own behalf. 6. MASD sacrum/Skin/Wound Care: Routine pressure relief measures. Santyl with daily dressing changes to sacral MASD.  7. Fluids/Electrolytes/Nutrition: Monitor I/O. Offer supplements to help with low protein stores.  8. Fluid overload with anasarca: Improving with diuresis. Continue to monitor daily weights. 9. Chronic diastolic CHF: Monitor daily weights. Continue lasix, digoxin and atenolol.  10 PAF: Monitor heart rate every 8 hours. Continue atenolol, digoxin and coumadin.  11. OSA: Continue to use CPAP daily. 12. C diff colitis: continue to have diarrhea with bloating--used questran at home for GI symptoms. Continue Metronidazole D# 10/14. 13. ABLA: Will check H/H in am. 14. DM type 2: Diet controlled. Hgb A1C-5.9. Monitor BS with ac/hs checks. Use SSI for elevated BS.  14. Anxiety disorder: Buspar resumed today. Will monitor mood and taper off Risperdal in the next few days.  16.  Chronic insomnia: Resume Restoril. 17. Leucocytosis: Likely reactive and resolving. Monitor for signs of infection. Recheck CBC in am.  18. Minimally invasive MVR: Monitor wound for healing. No need for sternal precautions.  19. Chronic peripheral edema: Does not tolerate support stocking. Does not like to elevate BLE. Will try ace wraps. Continue to encourage elevation.    Post Admission Physician Evaluation: 1. Functional deficits secondary to Deconditioning after mitral valve replacement 01/24/2015 in a patient with morbid obesity and postoperative delirium 2. Patient is admitted to receive collaborative, interdisciplinary care between the physiatrist, rehab nursing staff, and therapy team. 3. Patient's level of medical complexity and substantial therapy needs in context of that medical necessity cannot be provided at a lesser intensity of care  such as a SNF. 4. Patient has experienced substantial functional loss from his/her baseline which was documented above under the "Functional History" and "Functional Status" headings. Judging by the patient's diagnosis, physical exam, and functional history, the patient has potential for functional progress which will result in measurable gains while on inpatient rehab. These gains will be of substantial and practical use upon discharge in facilitating mobility and self-care at the household level. 5. Physiatrist will provide 24 hour management of medical needs as well as oversight of the therapy plan/treatment and provide guidance as appropriate regarding the interaction of the two. 6. 24 hour rehab nursing will assist with bladder management, bowel management, safety, skin/wound care, disease management, medication administration, pain management and patient education and help integrate therapy concepts, techniques,education, etc. 7. PT will assess and treat for/with: pre gait, gait training, endurance , safety, equipment, neuromuscular re  education. Goals are: Supervision. 8. OT will assess and treat for/with: ADLs, Cognitive perceptual skills, Neuromuscular re education, safety, endurance, equipment. Goals are: Supervision/ModI. Therapy may proceed with showering this patient. 9. SLP will assess and treat for/with: Memory, attention, concentration, problem solving, medication management. Goals are: Supervision medication management. 10. Case Management and Social Worker will assess and treat for psychological issues and discharge planning. 11. Team conference will be held weekly to assess progress toward goals and to determine barriers to discharge. 12. Patient will receive at least 3 hours of therapy per day at least 5 days per week. 13. ELOS: 10-14d  14. Prognosis: good     Charlett Blake M.D. [facility] FAAPM&R (Sports Med, Neuromuscular Med) Diplomate [place] Board of Electrodiagnostic Med  02/08/2015

## 2015-02-23 ENCOUNTER — Other Ambulatory Visit: Payer: Self-pay | Admitting: Thoracic Surgery (Cardiothoracic Vascular Surgery)

## 2015-02-23 DIAGNOSIS — I7101 Dissection of ascending aorta: Secondary | ICD-10-CM

## 2015-02-26 ENCOUNTER — Ambulatory Visit (INDEPENDENT_AMBULATORY_CARE_PROVIDER_SITE_OTHER): Payer: Self-pay | Admitting: Surgical

## 2015-02-26 ENCOUNTER — Ambulatory Visit (INDEPENDENT_AMBULATORY_CARE_PROVIDER_SITE_OTHER): Payer: Medicare HMO | Admitting: *Deleted

## 2015-02-26 ENCOUNTER — Ambulatory Visit
Admission: RE | Admit: 2015-02-26 | Discharge: 2015-02-26 | Disposition: A | Discharge status: Home / Self Care | Payer: Medicare HMO | Source: Ambulatory Visit | Attending: Thoracic Surgery (Cardiothoracic Vascular Surgery) | Admitting: Thoracic Surgery (Cardiothoracic Vascular Surgery)

## 2015-02-26 VITALS — BP 129/78 | HR 87 | Resp 16 | Ht 61.5 in | Wt 228.0 lb

## 2015-02-26 DIAGNOSIS — Z952 Presence of prosthetic heart valve: Secondary | ICD-10-CM

## 2015-02-26 DIAGNOSIS — Z954 Presence of other heart-valve replacement: Secondary | ICD-10-CM

## 2015-02-26 DIAGNOSIS — I481 Persistent atrial fibrillation: Secondary | ICD-10-CM | POA: Diagnosis not present

## 2015-02-26 DIAGNOSIS — Z5181 Encounter for therapeutic drug level monitoring: Secondary | ICD-10-CM

## 2015-02-26 DIAGNOSIS — I340 Nonrheumatic mitral (valve) insufficiency: Secondary | ICD-10-CM

## 2015-02-26 DIAGNOSIS — I482 Chronic atrial fibrillation, unspecified: Secondary | ICD-10-CM

## 2015-02-26 DIAGNOSIS — I7101 Dissection of ascending aorta: Secondary | ICD-10-CM

## 2015-02-26 DIAGNOSIS — I4819 Other persistent atrial fibrillation: Secondary | ICD-10-CM

## 2015-02-26 LAB — POCT INR: INR: 2.1

## 2015-02-26 NOTE — Progress Notes (Signed)
[address]       [place] [address]             [phone]                  Spiro Moten McConnell Medical Record #[id number] Date of Birth: [birth year]  Referring UU:[id number], [place] [facility]iology: Primary Care:SPENCER,SARA C, PA-C  Chief Complaint:  Follow Up Visit  History of Present Illness:        Date of Procedure:01/24/2015  Preoperative Diagnosis:  Severe Mitral Regurgitation  S/P Bentall Aortic Root Replacement  Postoperative Diagnosis:Same  Procedure:   Reoperation for Minimally-Invasive Mitral Valve Replacement St. Jude bileaflet mechanical valve (size 49mm, catalog # [id number], serial # [id number])  Surgeon:Clarence H. Roxy Manns, MD  Assistant:Griffen Frayne Orson Ape, PA-C  Anesthesia:Adam Hodierne, MD  Operative Findings:  Degenerative mitral valve disease with Type I and Type II dysfunction including severe annular dilatation and flail chordae tendinae to anterior leaflet  Severe mitral regurgitation  Normal LV systolic function  Severe LA chamber enlargement  Pulmonary hypertension at baseline  Increased oxygen requirements at baseline  Unsuccessful attempt at mitral valve repair - valve replaced using chord preserving technique    The patient is a 54 year old male status post the above procedure seen in the office on today's date for routine follow-up. Overall the patient reports he is doing well. He spent time in inpatient rehabilitation and his stamina is improving however still quite limited. He does use a wheelchair at times for any activity requiring lengthy ambulation. He denies shortness of breath. He denies chest pain. He denies fevers, chills or other constitutional symptoms. 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 This is showing slight improvement over time but is felt  to be significant per the patient. Grip strength is fairly normal but not totally.  Zubrod Score: At the time of surgery this patient's most appropriate activity status/level should be described as: []     0    Normal activity, no symptoms [x]     1    Restricted in physical strenuous activity but ambulatory, able to do out light work []     2    Ambulatory and capable of self care, unable to do work activities, up and about                 >50 % of waking hours                                                                                   []     3    Only limited self care, in bed greater than 50% of waking hours []     4    Completely disabled, no self care, confined to bed or chair []     5    Moribund  History  Smoking status  . Light Tobacco Smoker  . Types: Cigars  Smokeless tobacco  . Never Used    Comment: about 3 yearly- cigar       Allergies  Allergen Reactions  . Penicillins  REACTION: Hives  . Adhesive [Tape] Other (See Comments)    Shin irritation    Current Outpatient Prescriptions  Medication Sig Dispense Refill  . allopurinol (ZYLOPRIM) 300 MG tablet Take 300 mg by mouth daily.    Marland Kitchen aspirin EC 81 MG EC tablet Take 1 tablet (81 mg total) by mouth daily.    Marland Kitchen atenolol (TENORMIN) 100 MG tablet Take 0.5 tablets (50 mg total) by mouth 2 (two) times daily.    Marland Kitchen b complex vitamins tablet Take 1 tablet by mouth daily.      . Calcium-Magnesium-Zinc (CAL-MAG-ZINC PO) Take by mouth daily.      . digoxin (LANOXIN) 0.25 MG tablet Take 1 tablet (0.25 mg total) by mouth daily. 90 tablet 3  . furosemide (LASIX) 40 MG tablet Take 1 tablet (40 mg total) by mouth 2 (two) times daily. Take with breakfast and after lunch daily 60 tablet 1  . magnesium oxide (MAG-OX) 400 (241.3 MG) MG tablet Take 1 tablet (400 mg total) by mouth 2 (two) times daily. 60 tablet 1  . methocarbamol (ROBAXIN) 500 MG tablet Take 500 mg by mouth 3 (three) times daily.     . ONE TOUCH ULTRA TEST test strip      . oxyCODONE (OXY IR/ROXICODONE) 5 MG immediate release tablet Take 5 mg by mouth every 4 (four) hours as needed for moderate pain.     . potassium chloride (K-DUR) 10 MEQ tablet Take 2 tablets (20 mEq total) by mouth 2 (two) times daily. 120 tablet 1  . temazepam (RESTORIL) 15 MG capsule Take 15 mg by mouth at bedtime as needed for sleep.    Marland Kitchen warfarin (COUMADIN) 5 MG tablet Take 1 to 1.5 tablets by mouth daily as directed by coumadin clinic (Patient taking differently: Take 5-7.5 mg by mouth See admin instructions. 7.5mg  on Wednesdays, 5mg  all other days) 120 tablet 1   No current facility-administered medications for this visit.       Physical Exam: BP 129/78 mmHg  Pulse 87  Resp 16  Ht 5' 1.5" (1.562 m)  Wt 228 lb (103.42 kg)  BMI 42.39 kg/m2  SpO2 93%  General appearance: alert, cooperative and no distress Neurologic: Right side hand gross numbness motor function is grossly intact. Heart: regular rate and rhythm and 3/6 systolic murmur Lungs: clear to auscultation bilaterally Extremities: edema Significant and venous stasis dermatitis noted Wounds: The right groin has slight superficial separation which she is treating with Santyl daily. There is no evidence of purulence or cellulitis.  Diagnostic Studies & Laboratory data:         Recent Radiology Findings: Dg Chest 2 View  02/26/2015   CLINICAL DATA:  Ascending thoracic aortic dissection  EXAM: CHEST  2 VIEW  COMPARISON:  February 13, 2015  FINDINGS: There is been interval partial clearing of interstitial edema. There remains trace edema in the bases. There are several 3-4 mm nodular opacities in the right mid lower lung zones and to a lesser extent in the left lower lung zone, stable. No new parenchymal lung opacity is seen.  The heart is borderline enlarged with pulmonary vascular within normal limits. There is a mitral valve replacement. The mediastinum does not appear changed compared to recent prior study. In particular,  there is no obvious dilatation of the ascending aorta by radiography. There is no appreciable adenopathy. There is degenerative change in the thoracic spine.  IMPRESSION: Persistent nodular interstitial disease, chronic. There may be trace edema in the bases. No  airspace consolidation or new opacity. Heart is mildly enlarged. The mediastinum does not appear widened; there is no obvious aortic dissection by radiography. If aortic dissection is of concern and further imaging is felt to be advisable, CT with contrast could be helpful to further assess.   Electronically Signed   By: Lowella Grip III M.D.   On: 02/26/2015 13:03      I have independently reviewed the above radiology findings and reviewed findings  with the patient.  Recent Labs: Lab Results  Component Value Date   WBC 6.8 02/12/2015   HGB 10.6* 02/12/2015   HCT 34.7* 02/12/2015   PLT 248 02/12/2015   GLUCOSE 169* 02/15/2015   CHOL 118 08/18/2012   TRIG 133.0 08/18/2012   HDL 27.20* 08/18/2012   LDLCALC 64 08/18/2012   ALT 16* 02/09/2015   AST 21 02/09/2015   NA 135 02/15/2015   K 3.7 02/15/2015   CL 96* 02/15/2015   CREATININE 0.77 02/15/2015   BUN 14 02/15/2015   CO2 30 02/15/2015   TSH 1.67 10/08/2011   INR 2.1 02/26/2015   HGBA1C 5.9* 01/22/2015      Assessment / Plan:  Overall the patient's clinical progress is good. Hemodynamically he appears quite stable and is showing a steady improvement in his overall physical recovery although significantly limited due to morbid obesity. He is to continue rehabilitation at cardiac rehabilitation as home physical therapy has stopped seeing him at this point. He will also make an appointment to see Dr. Archie Patten for cardiology follow-up. We will see him again in 2 months or earlier if he has any wound problems or other surgically related issues. He will continue his current dressing changes for the next 1-2 weeks as needed as the wound is quite superficial at this point.          Olesya Wike E 02/26/2015 1:52 PM

## 2015-02-26 NOTE — Patient Instructions (Signed)
Given verbal instructions regarding ongoing wound care.

## 2015-03-01 ENCOUNTER — Telehealth: Payer: Self-pay | Admitting: *Deleted

## 2015-03-01 DIAGNOSIS — I5032 Chronic diastolic (congestive) heart failure: Secondary | ICD-10-CM

## 2015-03-01 HISTORY — DX: Chronic diastolic (congestive) heart failure: I50.32

## 2015-03-01 NOTE — Telephone Encounter (Signed)
Spoke with Viann Shove nurse with [facility] who states she is admitting pt to home  Health and can do INR while in the home with pt and call results so order given for this and she requested that INR be done on July 7th and that order given with verbalization of understanding

## 2015-03-02 DIAGNOSIS — I739 Peripheral vascular disease, unspecified: Secondary | ICD-10-CM

## 2015-03-02 DIAGNOSIS — E669 Obesity, unspecified: Secondary | ICD-10-CM

## 2015-03-02 DIAGNOSIS — I5032 Chronic diastolic (congestive) heart failure: Secondary | ICD-10-CM | POA: Diagnosis not present

## 2015-03-02 DIAGNOSIS — L89322 Pressure ulcer of left buttock, stage 2: Secondary | ICD-10-CM | POA: Diagnosis not present

## 2015-03-02 DIAGNOSIS — E119 Type 2 diabetes mellitus without complications: Secondary | ICD-10-CM | POA: Diagnosis not present

## 2015-03-02 DIAGNOSIS — Z7901 Long term (current) use of anticoagulants: Secondary | ICD-10-CM

## 2015-03-02 DIAGNOSIS — I10 Essential (primary) hypertension: Secondary | ICD-10-CM

## 2015-03-02 DIAGNOSIS — M150 Primary generalized (osteo)arthritis: Secondary | ICD-10-CM

## 2015-03-02 DIAGNOSIS — Z5181 Encounter for therapeutic drug level monitoring: Secondary | ICD-10-CM

## 2015-03-02 DIAGNOSIS — I2510 Atherosclerotic heart disease of native coronary artery without angina pectoris: Secondary | ICD-10-CM

## 2015-03-02 DIAGNOSIS — Z48812 Encounter for surgical aftercare following surgery on the circulatory system: Secondary | ICD-10-CM | POA: Diagnosis not present

## 2015-03-08 ENCOUNTER — Ambulatory Visit (INDEPENDENT_AMBULATORY_CARE_PROVIDER_SITE_OTHER): Payer: Medicare HMO | Admitting: Cardiovascular Disease

## 2015-03-08 DIAGNOSIS — Z5181 Encounter for therapeutic drug level monitoring: Secondary | ICD-10-CM

## 2015-03-08 DIAGNOSIS — I482 Chronic atrial fibrillation, unspecified: Secondary | ICD-10-CM

## 2015-03-08 DIAGNOSIS — Z952 Presence of prosthetic heart valve: Secondary | ICD-10-CM

## 2015-03-08 DIAGNOSIS — Z954 Presence of other heart-valve replacement: Secondary | ICD-10-CM

## 2015-03-08 LAB — POCT INR: INR: 2.3

## 2015-03-14 ENCOUNTER — Telehealth: Payer: Self-pay | Admitting: Cardiology

## 2015-03-14 NOTE — Telephone Encounter (Signed)
Returned call to patient Dr.[name] advised to decrease Lasix to 40 mg daily and take extra dose if has sob or edema.Advised to keep appointment with Dr.[name] 03/23/15 at 4:15 pm.

## 2015-03-14 NOTE — Telephone Encounter (Signed)
Mr.Tirado is calling because he has been loosing a pound a day and starting to feel shaky and weak . Please Call   thanks

## 2015-03-14 NOTE — Telephone Encounter (Signed)
OK to reduce Lasix to 40 mg once daily and take extra dose if increased dyspnea or edema.

## 2015-03-14 NOTE — Telephone Encounter (Signed)
Returned call to patient he stated he is having weakness,no energy.B/P 110/68.Stated he is taking lasix 40 mg twice a day,wanted to ask Dr.[name] if he needs to decrease lasix, only having slight swelling in lower legs.No sob,breathing is much better. Message sent to [place] for advice.

## 2015-03-15 ENCOUNTER — Ambulatory Visit (INDEPENDENT_AMBULATORY_CARE_PROVIDER_SITE_OTHER): Payer: Medicare HMO | Admitting: Cardiology

## 2015-03-15 DIAGNOSIS — Z5181 Encounter for therapeutic drug level monitoring: Secondary | ICD-10-CM

## 2015-03-15 DIAGNOSIS — I482 Chronic atrial fibrillation, unspecified: Secondary | ICD-10-CM

## 2015-03-15 DIAGNOSIS — Z952 Presence of prosthetic heart valve: Secondary | ICD-10-CM

## 2015-03-15 DIAGNOSIS — Z954 Presence of other heart-valve replacement: Secondary | ICD-10-CM

## 2015-03-15 LAB — POCT INR: INR: 2.2

## 2015-03-19 ENCOUNTER — Telehealth: Payer: Self-pay | Admitting: *Deleted

## 2015-03-19 NOTE — Telephone Encounter (Signed)
INR order was faxed back to [facility]

## 2015-03-21 ENCOUNTER — Encounter: Payer: Self-pay | Admitting: Nurse Practitioner

## 2015-03-22 ENCOUNTER — Ambulatory Visit (INDEPENDENT_AMBULATORY_CARE_PROVIDER_SITE_OTHER): Payer: Medicare HMO | Admitting: Pharmacist

## 2015-03-22 DIAGNOSIS — I482 Chronic atrial fibrillation, unspecified: Secondary | ICD-10-CM

## 2015-03-22 DIAGNOSIS — Z5181 Encounter for therapeutic drug level monitoring: Secondary | ICD-10-CM

## 2015-03-22 DIAGNOSIS — Z952 Presence of prosthetic heart valve: Secondary | ICD-10-CM

## 2015-03-22 DIAGNOSIS — Z954 Presence of other heart-valve replacement: Secondary | ICD-10-CM

## 2015-03-22 LAB — POCT INR: INR: 2.9

## 2015-03-23 ENCOUNTER — Ambulatory Visit (INDEPENDENT_AMBULATORY_CARE_PROVIDER_SITE_OTHER): Payer: Medicare HMO | Admitting: Cardiology

## 2015-03-23 ENCOUNTER — Encounter: Payer: Self-pay | Admitting: Cardiology

## 2015-03-23 VITALS — BP 134/78 | HR 86 | Ht 61.0 in | Wt 210.0 lb

## 2015-03-23 DIAGNOSIS — Z954 Presence of other heart-valve replacement: Secondary | ICD-10-CM | POA: Diagnosis not present

## 2015-03-23 DIAGNOSIS — Z952 Presence of prosthetic heart valve: Secondary | ICD-10-CM

## 2015-03-23 DIAGNOSIS — I482 Chronic atrial fibrillation, unspecified: Secondary | ICD-10-CM
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
## 2015-03-23 NOTE — Patient Instructions (Signed)
Your physician recommends that you schedule a follow-up appointment in: Early October

## 2015-03-26 ENCOUNTER — Telehealth: Payer: Self-pay | Admitting: *Deleted

## 2015-03-26 NOTE — Telephone Encounter (Signed)
Sign order to perform PT INR on 03/22/15 was [facility]ed [facility] to [facility].

## 2015-04-02 ENCOUNTER — Telehealth: Payer: Self-pay | Admitting: Cardiology

## 2015-04-02 DIAGNOSIS — I4891 Unspecified atrial fibrillation: Secondary | ICD-10-CM

## 2015-04-02 DIAGNOSIS — Z952 Presence of prosthetic heart valve: Secondary | ICD-10-CM

## 2015-04-02 DIAGNOSIS — I5032 Chronic diastolic (congestive) heart failure: Secondary | ICD-10-CM

## 2015-04-02 NOTE — Telephone Encounter (Signed)
Echo ordered per Nya.

## 2015-04-02 NOTE — Telephone Encounter (Signed)
OV note stated patient needs echo prior to October visit.

## 2015-04-02 NOTE — Telephone Encounter (Signed)
Complete echo before appointment in October?

## 2015-04-02 NOTE — Telephone Encounter (Signed)
New Message      Pt calling to schedule Echo, no order in Epic. Please call back and advise. Pt states it is ok to leave detailed message on vm.

## 2015-04-05 ENCOUNTER — Ambulatory Visit (INDEPENDENT_AMBULATORY_CARE_PROVIDER_SITE_OTHER): Payer: Medicare HMO | Admitting: Internal Medicine

## 2015-04-05 DIAGNOSIS — Z5181 Encounter for therapeutic drug level monitoring: Secondary | ICD-10-CM

## 2015-04-05 DIAGNOSIS — Z952 Presence of prosthetic heart valve: Secondary | ICD-10-CM

## 2015-04-05 DIAGNOSIS — I482 Chronic atrial fibrillation, unspecified: Secondary | ICD-10-CM

## 2015-04-05 DIAGNOSIS — Z954 Presence of other heart-valve replacement: Secondary | ICD-10-CM

## 2015-04-05 LAB — POCT INR: INR: 3.5

## 2015-04-09 ENCOUNTER — Other Ambulatory Visit (HOSPITAL_COMMUNITY): Payer: Self-pay

## 2015-04-09 ENCOUNTER — Telehealth: Payer: Self-pay | Admitting: *Deleted

## 2015-04-09 NOTE — Telephone Encounter (Signed)
May perform INR on 04/05/15 was sign and faxed back to [facility]

## 2015-04-11 ENCOUNTER — Other Ambulatory Visit: Payer: Self-pay

## 2015-04-11 ENCOUNTER — Ambulatory Visit (HOSPITAL_COMMUNITY): Payer: Medicare HMO | Attending: Cardiology

## 2015-04-11 DIAGNOSIS — I5032 Chronic diastolic (congestive) heart failure: Secondary | ICD-10-CM

## 2015-04-11 DIAGNOSIS — I313 Pericardial effusion (noninflammatory): Secondary | ICD-10-CM | POA: Diagnosis not present

## 2015-04-11 DIAGNOSIS — I4891 Unspecified atrial fibrillation: Secondary | ICD-10-CM | POA: Diagnosis not present

## 2015-04-11 DIAGNOSIS — Z952 Presence of prosthetic heart valve: Secondary | ICD-10-CM | POA: Diagnosis not present

## 2015-04-11 DIAGNOSIS — I517 Cardiomegaly: Secondary | ICD-10-CM | POA: Insufficient documentation

## 2015-04-11 DIAGNOSIS — E119 Type 2 diabetes mellitus without complications: Secondary | ICD-10-CM | POA: Insufficient documentation

## 2015-04-11 DIAGNOSIS — Z954 Presence of other heart-valve replacement: Secondary | ICD-10-CM | POA: Diagnosis not present

## 2015-04-11 DIAGNOSIS — Z6839 Body mass index (BMI) 39.0-39.9, adult: Secondary | ICD-10-CM | POA: Insufficient documentation

## 2015-04-11 DIAGNOSIS — E785 Hyperlipidemia, unspecified: Secondary | ICD-10-CM | POA: Diagnosis not present

## 2015-04-11 DIAGNOSIS — I10 Essential (primary) hypertension: Secondary | ICD-10-CM | POA: Diagnosis not present

## 2015-04-19 ENCOUNTER — Ambulatory Visit (INDEPENDENT_AMBULATORY_CARE_PROVIDER_SITE_OTHER): Payer: Medicare HMO | Admitting: *Deleted

## 2015-04-19 DIAGNOSIS — Z954 Presence of other heart-valve replacement: Secondary | ICD-10-CM | POA: Diagnosis not present

## 2015-04-19 DIAGNOSIS — I482 Chronic atrial fibrillation, unspecified: Secondary | ICD-10-CM

## 2015-04-19 DIAGNOSIS — Z5181 Encounter for therapeutic drug level monitoring: Secondary | ICD-10-CM | POA: Diagnosis not present

## 2015-04-19 DIAGNOSIS — I481 Persistent atrial fibrillation: Secondary | ICD-10-CM

## 2015-04-19 DIAGNOSIS — Z952 Presence of prosthetic heart valve: Secondary | ICD-10-CM

## 2015-04-19 DIAGNOSIS — I4819 Other persistent atrial fibrillation: Secondary | ICD-10-CM

## 2015-04-19 LAB — POCT INR: INR: 3.3

## 2015-04-27 ENCOUNTER — Other Ambulatory Visit: Payer: Self-pay | Admitting: Thoracic Surgery (Cardiothoracic Vascular Surgery)

## 2015-04-27 DIAGNOSIS — Z952 Presence of prosthetic heart valve: Secondary | ICD-10-CM

## 2015-04-30 ENCOUNTER — Encounter: Payer: Self-pay | Admitting: Thoracic Surgery (Cardiothoracic Vascular Surgery)

## 2015-04-30 ENCOUNTER — Ambulatory Visit
Admission: RE | Admit: 2015-04-30 | Discharge: 2015-04-30 | Disposition: A | Discharge status: Home / Self Care | Payer: Medicare HMO | Source: Ambulatory Visit | Attending: Thoracic Surgery (Cardiothoracic Vascular Surgery) | Admitting: Thoracic Surgery (Cardiothoracic Vascular Surgery)

## 2015-04-30 ENCOUNTER — Ambulatory Visit (INDEPENDENT_AMBULATORY_CARE_PROVIDER_SITE_OTHER): Payer: Medicare HMO | Admitting: Thoracic Surgery (Cardiothoracic Vascular Surgery)

## 2015-04-30 VITALS — BP 136/87 | HR 81 | Resp 16 | Ht 61.5 in | Wt 218.0 lb

## 2015-04-30 DIAGNOSIS — Z952 Presence of prosthetic heart valve: Secondary | ICD-10-CM

## 2015-04-30 DIAGNOSIS — Z954 Presence of other heart-valve replacement: Secondary | ICD-10-CM

## 2015-04-30 DIAGNOSIS — I340 Nonrheumatic mitral (valve) insufficiency: Secondary | ICD-10-CM | POA: Diagnosis not present

## 2015-04-30 DIAGNOSIS — I482 Chronic atrial fibrillation, unspecified: Secondary | ICD-10-CM

## 2015-04-30 NOTE — Patient Instructions (Addendum)

## 2015-04-30 NOTE — Progress Notes (Signed)
[address]       [place] [id number]             [phone]     CARDIOTHORACIC SURGERY OFFICE NOTE  Referring Provider is Lance Breeding, MD PCP is Lance Dials, PA-C   HPI:  Patient returns to the office today status post minimally invasive mitral valve replacement using a bileaflet mechanical prosthetic valve on 01/24/2015 for severe symptomatic mitral regurgitation with history of Bentall aortic root replacement in the remote past.  An initial attempt to repair the patient's complex mitral valve pathology was unsuccessful.  The patient's early postoperative recovery in the hospital was notable for ventilator-dependent respiratory failure from which he eventually recovered uneventfully.  He also developed C. Difficile colitis. He was eventually discharged to inpatient rehabilitation service for convalescence and from there he was discharged home. He was last seen here in our office on 02/26/2015 at which time he was making excellent progress. He did report paresthesias and numbness along the ulnar aspect of his right forearm suggestive of possible ulnar nerve or brachial plexus injury. Symptoms were slowly improving. Since then the patient has continued to do well. He was seen in follow-up by Dr. Percival [place] on 03/23/2015 and routine follow-up echocardiogram performed 04/11/2015 revealed normal left ventricular systolic function with severe left ventricular hypertrophy and normal function of the recently placed mitral valve mechanical prosthesis.  The patient returns to our office for routine follow-up today. He reports that he is doing very well. He has never had any significant pain in his chest and he tells me that he lifted a 300 pound television the other day without difficulty. He denies any symptoms of exertional shortness of breath. He remains severely limited by chronic pain involving his lower back and both legs. Because of this he doesn't walk very much. He still  has some numbness in the ulnar nerve distribution of the right hand but this seems to be slowly improving. Overall he feels well and has no complaints.   Current Outpatient Prescriptions  Medication Sig Dispense Refill  . allopurinol (ZYLOPRIM) 300 MG tablet Take 300 mg by mouth daily.    Marland Kitchen aspirin EC 81 MG EC tablet Take 1 tablet (81 mg total) by mouth daily.    Marland Kitchen atenolol (TENORMIN) 100 MG tablet Take 0.5 tablets (50 mg total) by mouth 2 (two) times daily.    Marland Kitchen b complex vitamins tablet Take 1 tablet by mouth daily.      . Calcium-Magnesium-Zinc (CAL-MAG-ZINC PO) Take by mouth daily.      . furosemide (LASIX) 40 MG tablet Take 40 mg daily and may take extra 40 mg if has sob or edema 30 tablet 6  . magnesium oxide (MAG-OX) 400 (241.3 MG) MG tablet Take 1 tablet (400 mg total) by mouth 2 (two) times daily. 60 tablet 1  . methocarbamol (ROBAXIN) 500 MG tablet Take 500 mg by mouth 3 (three) times daily.     . ONE TOUCH ULTRA TEST test strip     . oxyCODONE (OXY IR/ROXICODONE) 5 MG immediate release tablet Take 5 mg by mouth every 4 (four) hours as needed for moderate pain.     . potassium chloride (K-DUR) 10 MEQ tablet Take 2 tablets (20 mEq total) by mouth 2 (two) times daily. 120 tablet 1  . temazepam (RESTORIL) 15 MG capsule Take 15 mg by mouth at bedtime as needed for sleep.    Marland Kitchen warfarin (COUMADIN) 5 MG tablet Take  1 to 1.5 tablets by mouth daily as directed by coumadin clinic (Patient taking differently: Take 5-7.5 mg by mouth See admin instructions. 7.5mg  on Wednesdays, 5mg  all other days) 120 tablet 1   No current facility-administered medications for this visit.      Physical Exam:   BP 136/87 mmHg  Pulse 81  Resp 16  Ht 5' 1.5" (1.562 m)  Wt 218 lb (98.884 kg)  BMI 40.53 kg/m2  SpO2 95%  General:  Well-appearing  Chest:   clear  CV:   Regular rate and rhythm with mechanical prosthetic heart sounds. The patient has a grade 3/6 systolic murmur heard best along the sternal  border  Incisions:  Completely healed. One small area where pacing wire exit site was located is explored for possible retained suture, no foreign body is identified  Abdomen:  Soft and nontender  Extremities:  Warm and well-perfused with severe chronic lower extremity edema  Diagnostic Tests:  Transthoracic Echocardiography  Patient:  Lance Cook, Lance Cook MR #:    [id number] Study Date: 04/11/2015 Gender:   M Age:    54 Height:   154.9 cm Weight:   95.3 kg BSA:    2.08 m^2 Pt. Status: Room:  Lance [place] REFERRING  Lance [place] SONOGRAPHER [place], [place] ATTENDING  Lance [place], M.D. PERFORMING  [facility], Outpatient  cc:  ------------------------------------------------------------------- LV EF: 55% -  60%  ------------------------------------------------------------------- Indications:   (Z95.4).  ------------------------------------------------------------------- History:  PMH: Bentall procedure 1988. MVR 01/24/2015. Acquired from the patient and from the patient&'s chart. Dyspnea. Atrial fibrillation. Coronary artery disease. Congestive heart failure. Risk factors: Hypertension. Diabetes mellitus. Morbidly obese. Dyslipidemia.  ------------------------------------------------------------------- Study Conclusions  - Left ventricle: The cavity size was normal. There was severe concentric hypertrophy. Systolic function was normal. The estimated ejection fraction was in the range of 55% to 60%. Wall motion was normal; there were no regional wall motion abnormalities. - Aortic valve: A mechanical prosthesis was present. The sewing ring had no rocking motion. Peak velocity (S): 402 cm/s. Peak velocity is higher than expected for aortic valve replacement. - Mitral valve: A mechanical prosthesis was present and functioning normally. The prosthesis had a normal range of motion. The sewing ring appeared  normal. - Left atrium: The atrium was severely dilated. - Pulmonary arteries: Systolic pressure was moderately increased. PA peak pressure: 60 mm Hg (S). - Pericardium, extracardiac: A trivial pericardial effusion was identified posterior to the heart.  Transthoracic echocardiography. M-mode, complete 2D, spectral Doppler, and color Doppler. Birthdate: Patient birthdate: 07-03-1961. Age: Patient is 54 yr old. Sex: Gender: male. BMI: 39.7 kg/m^2. Blood pressure:   134/78 Patient status: Outpatient. Study date: Study date: 04/11/2015. Study time: 03:00 PM. Location: [facility]  -------------------------------------------------------------------  ------------------------------------------------------------------- Left ventricle: The cavity size was normal. There was severe concentric hypertrophy. Systolic function was normal. The estimated ejection fraction was in the range of 55% to 60%. Wall motion was normal; there were no regional wall motion abnormalities.  ------------------------------------------------------------------- Aortic valve: A mechanical prosthesis was present. The sewing ring had no rocking motion. Doppler:   VTI ratio of LVOT to aortic valve: 0.16. Valve area (VTI): 1 cm^2. Indexed valve area (VTI): 0.48 cm^2/m^2. Peak velocity ratio of LVOT to aortic valve: 0.15. Valve area (Vmax): 0.93 cm^2. Indexed valve area (Vmax): 0.45 cm^2/m^2. Mean velocity ratio of LVOT to aortic valve: 0.17. Valve area (Vmean): 1.03 cm^2. Indexed valve area (Vmean): 0.49 cm^2/m^2.  Mean gradient (S): 28 mm Hg. Peak gradient (S): 65 mm  Hg.  ------------------------------------------------------------------- Aorta: Aortic root: The aortic root was normal in size. Prior Bentall.  ------------------------------------------------------------------- Mitral valve: A mechanical prosthesis was present and functioning normally. The prosthesis had a normal range  of motion. The sewing ring appeared normal. Doppler:   Peak gradient (D): 13 mm Hg.  ------------------------------------------------------------------- Left atrium: The atrium was severely dilated.  ------------------------------------------------------------------- Right ventricle: The cavity size was normal. Wall thickness was normal. Systolic function was normal.  ------------------------------------------------------------------- Pulmonic valve:  Structurally normal valve.  Cusp separation was normal. Doppler: Transvalvular velocity was within the normal range. There was no evidence for stenosis. There was trivial regurgitation.  ------------------------------------------------------------------- Tricuspid valve:  Structurally normal valve.  Doppler: Transvalvular velocity was within the normal range. There was mild regurgitation.  ------------------------------------------------------------------- Pulmonary artery:  The main pulmonary artery was normal-sized. Systolic pressure was moderately increased.  ------------------------------------------------------------------- Right atrium: The atrium was normal in size.  ------------------------------------------------------------------- Pericardium: A trivial pericardial effusion was identified posterior to the heart.  ------------------------------------------------------------------- Systemic veins: Inferior vena cava: The vessel was dilated. The respirophasic diameter changes were blunted (< 50%), consistent with elevated central venous pressure.  ------------------------------------------------------------------- Measurements  Left ventricle              Value     Reference LV ID, ED, PLAX chordal      (L)   41.1 mm    43 - 52 LV ID, ES, PLAX chordal          24.2 mm    23 - 38 LV fx shortening, PLAX chordal      41  %    >=29 LV PW  thickness, ED            17.4 mm    --------- IVS/LV PW ratio, ED            1.07      <=1.3 Stroke volume, 2D             67  ml    --------- Stroke volume/bsa, 2D           32  ml/m^2  ---------  Ventricular septum            Value     Reference IVS thickness, ED             18.6 mm    ---------  LVOT                   Value     Reference LVOT ID, S                28  mm    --------- LVOT area                 6.16 cm^2   --------- LVOT ID                  28  mm    --------- LVOT peak velocity, S           60.6 cm/s   --------- LVOT mean velocity, S           40  cm/s   --------- LVOT VTI, S                10.9 cm    --------- LVOT peak gradient, S           1   mm Hg  --------- Stroke volume (SV), LVOT DP        67.1 ml    ---------  Stroke index (SV/bsa), LVOT DP      32.3 ml/m^2  ---------  Aortic valve               Value     Reference Aortic valve peak velocity, S       402  cm/s   --------- Aortic valve mean velocity, S       240  cm/s   --------- Aortic valve VTI, S            67  cm    --------- Aortic mean gradient, S          28  mm Hg  --------- Aortic peak gradient, S          65  mm Hg  --------- VTI ratio, LVOT/AV            0.16      --------- Aortic valve area, VTI          1   cm^2   --------- Aortic valve area/bsa, VTI        0.48 cm^2/m^2 --------- Velocity ratio, peak, LVOT/AV       0.15      --------- Aortic valve area, peak velocity     0.93 cm^2   --------- Aortic valve area/bsa, peak        0.45 cm^2/m^2  --------- velocity Velocity ratio, mean, LVOT/AV       0.17      --------- Aortic valve area, mean velocity     1.03 cm^2   --------- Aortic valve area/bsa, mean        0.49 cm^2/m^2 --------- velocity  Aorta                   Value     Reference Aortic root ID, ED            34  mm    --------- Ascending aorta ID, A-P, S        34  mm    ---------  Left atrium                Value     Reference LA ID, A-P, ES              54  mm    --------- LA ID/bsa, A-P          (H)   2.6  cm/m^2  <=2.2 LA volume, S               149  ml    --------- LA volume/bsa, S             71.7 ml/m^2  --------- LA volume, ES, 1-p A4C          154  ml    --------- LA volume/bsa, ES, 1-p A4C        74.2 ml/m^2  --------- LA volume, ES, 1-p A2C          135  ml    --------- LA volume/bsa, ES, 1-p A2C        65  ml/m^2  ---------  Mitral valve               Value     Reference Mitral E-wave peak velocity        180  cm/s   --------- Mitral deceleration time         165  ms    150 - 230 Mitral peak gradient, D  13  mm Hg  ---------  Pulmonary arteries            Value     Reference PA pressure, S, DP        (H)   60  mm Hg  <=30  Tricuspid valve              Value     Reference Tricuspid regurg peak velocity      334  cm/s   --------- Tricuspid peak RV-RA gradient       45  mm Hg  ---------  Systemic veins              Value     Reference Estimated CVP               15  mm Hg  ---------  Right ventricle              Value     Reference RV pressure, S, DP        (H)    60  mm Hg  <=30  Legend: (L) and (H) mark values outside specified reference range.  ------------------------------------------------------------------- Prepared and Electronically Authenticated by  Lance [place], M.D. 2016-08-10T16:03:57     CHEST 2 VIEW  COMPARISON: 02/26/15  FINDINGS: Status post cardiac surgery and mitral valve replacement. Cardiac silhouette is mildly enlarged. Aorta is mildly uncoiled. No mediastinal or hilar masses or evidence of adenopathy.  There is central vascular congestion similar to the prior study. Mild interstitial prominence is stable. No overt pulmonary edema. No lung consolidation is seen to suggest pneumonia. No pleural effusion or pneumothorax.  Skeletal structures are demineralized but grossly intact.  IMPRESSION: 1. No acute cardiopulmonary disease. 2. Stable changes from previous cardiac surgery and mitral valve replacement. No change from the prior chest radiograph.   Electronically Signed  By: Lajean Manes M.D.  On: 04/30/2015 11:44    Impression:  Patient is doing very well approximately 3 months following a minimally invasive mitral valve replacement using a mechanical prosthetic valve.    Plan:  The patient may continue to increase his activity without any limitations at this time. We have not recommended any changes to the patient's current medications. The patient will return for routine follow-up next May, approximately 1 year following his original surgery.  I spent in excess of 15 minutes during the conduct of this office consultation and >50% of this time involved direct face-to-face encounter with the patient for counseling and/or coordination of their care.   Valentina Gu. Roxy Manns, MD 04/30/2015 12:18 PM

## 2015-05-10 ENCOUNTER — Ambulatory Visit (INDEPENDENT_AMBULATORY_CARE_PROVIDER_SITE_OTHER): Payer: Medicare HMO | Admitting: Pharmacist

## 2015-05-10 DIAGNOSIS — I482 Chronic atrial fibrillation, unspecified: Secondary | ICD-10-CM

## 2015-05-10 DIAGNOSIS — Z5181 Encounter for therapeutic drug level monitoring: Secondary | ICD-10-CM

## 2015-05-10 DIAGNOSIS — I4819 Other persistent atrial fibrillation: Secondary | ICD-10-CM

## 2015-05-10 DIAGNOSIS — Z954 Presence of other heart-valve replacement: Secondary | ICD-10-CM

## 2015-05-10 DIAGNOSIS — I481 Persistent atrial fibrillation: Secondary | ICD-10-CM | POA: Diagnosis not present

## 2015-05-10 DIAGNOSIS — Z952 Presence of prosthetic heart valve: Secondary | ICD-10-CM

## 2015-05-10 LAB — POCT INR: INR: 1.8

## 2015-05-14 ENCOUNTER — Other Ambulatory Visit: Payer: Self-pay | Admitting: Cardiology

## 2015-05-17 ENCOUNTER — Other Ambulatory Visit: Payer: Self-pay | Admitting: Physical Medicine and Rehabilitation

## 2015-05-24 ENCOUNTER — Ambulatory Visit (INDEPENDENT_AMBULATORY_CARE_PROVIDER_SITE_OTHER): Payer: Medicare HMO | Admitting: Pharmacist

## 2015-05-24 DIAGNOSIS — Z952 Presence of prosthetic heart valve: Secondary | ICD-10-CM

## 2015-05-24 DIAGNOSIS — Z5181 Encounter for therapeutic drug level monitoring: Secondary | ICD-10-CM | POA: Diagnosis not present

## 2015-05-24 DIAGNOSIS — I482 Chronic atrial fibrillation, unspecified: Secondary | ICD-10-CM

## 2015-05-24 DIAGNOSIS — I481 Persistent atrial fibrillation: Secondary | ICD-10-CM | POA: Diagnosis not present

## 2015-05-24 DIAGNOSIS — Z954 Presence of other heart-valve replacement: Secondary | ICD-10-CM

## 2015-05-24 DIAGNOSIS — I4819 Other persistent atrial fibrillation: Secondary | ICD-10-CM

## 2015-05-24 LAB — POCT INR: INR: 1.7

## 2015-06-04 ENCOUNTER — Encounter: Payer: Self-pay | Admitting: Cardiology

## 2015-06-04 ENCOUNTER — Ambulatory Visit (INDEPENDENT_AMBULATORY_CARE_PROVIDER_SITE_OTHER): Payer: Medicare HMO | Admitting: Cardiology

## 2015-06-04 ENCOUNTER — Ambulatory Visit (INDEPENDENT_AMBULATORY_CARE_PROVIDER_SITE_OTHER): Payer: Medicare HMO | Admitting: Pharmacist Clinician (PhC)/ Clinical Pharmacy Specialist

## 2015-06-04 VITALS — BP 126/76 | HR 72 | Ht 61.5 in | Wt 206.9 lb

## 2015-06-04 DIAGNOSIS — Z954 Presence of other heart-valve replacement: Secondary | ICD-10-CM | POA: Diagnosis not present

## 2015-06-04 DIAGNOSIS — I481 Persistent atrial fibrillation: Secondary | ICD-10-CM

## 2015-06-04 DIAGNOSIS — Z5181 Encounter for therapeutic drug level monitoring: Secondary | ICD-10-CM

## 2015-06-04 DIAGNOSIS — I482 Chronic atrial fibrillation, unspecified: Secondary | ICD-10-CM

## 2015-06-04 DIAGNOSIS — Z952 Presence of prosthetic heart valve: Secondary | ICD-10-CM

## 2015-06-04 DIAGNOSIS — I4819 Other persistent atrial fibrillation: Secondary | ICD-10-CM

## 2015-06-04 LAB — POCT INR: INR: 4

## 2015-06-04 MED ORDER — FUROSEMIDE 40 MG PO TABS: ORAL_TABLET | ORAL | Status: DC

## 2015-06-04 MED ORDER — MAGNESIUM OXIDE 400 (241.3 MG) MG PO TABS: 400.0000 mg | ORAL_TABLET | Freq: Two times a day (BID) | ORAL | Status: DC
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
## 2015-06-04 NOTE — Patient Instructions (Signed)
Your physician wants you to follow-up in: 6 Months. You will receive a reminder letter in the mail two months in advance. If you don't receive a letter, please call our office to schedule the follow-up appointment.  Benfo Tiamine

## 2015-06-19 ENCOUNTER — Ambulatory Visit (INDEPENDENT_AMBULATORY_CARE_PROVIDER_SITE_OTHER): Payer: Medicare HMO | Admitting: Pharmacist

## 2015-06-19 DIAGNOSIS — E669 Obesity, unspecified: Secondary | ICD-10-CM | POA: Insufficient documentation

## 2015-06-19 DIAGNOSIS — I481 Persistent atrial fibrillation: Secondary | ICD-10-CM

## 2015-06-19 DIAGNOSIS — I482 Chronic atrial fibrillation, unspecified: Secondary | ICD-10-CM

## 2015-06-19 DIAGNOSIS — Z5181 Encounter for therapeutic drug level monitoring: Secondary | ICD-10-CM

## 2015-06-19 DIAGNOSIS — Z954 Presence of other heart-valve replacement: Secondary | ICD-10-CM

## 2015-06-19 DIAGNOSIS — Z952 Presence of prosthetic heart valve: Secondary | ICD-10-CM

## 2015-06-19 DIAGNOSIS — IMO0002 Reserved for concepts with insufficient information to code with codable children: Secondary | ICD-10-CM | POA: Insufficient documentation

## 2015-06-19 DIAGNOSIS — I4819 Other persistent atrial fibrillation: Secondary | ICD-10-CM

## 2015-06-19 LAB — POCT INR: INR: 3.7

## 2015-07-03 ENCOUNTER — Ambulatory Visit (INDEPENDENT_AMBULATORY_CARE_PROVIDER_SITE_OTHER): Payer: Medicare HMO | Admitting: Pharmacist

## 2015-07-03 DIAGNOSIS — Z5181 Encounter for therapeutic drug level monitoring: Secondary | ICD-10-CM | POA: Diagnosis not present

## 2015-07-03 DIAGNOSIS — I482 Chronic atrial fibrillation, unspecified: Secondary | ICD-10-CM

## 2015-07-03 DIAGNOSIS — I481 Persistent atrial fibrillation: Secondary | ICD-10-CM | POA: Diagnosis not present

## 2015-07-03 DIAGNOSIS — Z954 Presence of other heart-valve replacement: Secondary | ICD-10-CM

## 2015-07-03 DIAGNOSIS — I4819 Other persistent atrial fibrillation: Secondary | ICD-10-CM

## 2015-07-03 DIAGNOSIS — Z952 Presence of prosthetic heart valve: Secondary | ICD-10-CM

## 2015-07-03 LAB — POCT INR: INR: 3.3

## 2015-07-11 ENCOUNTER — Other Ambulatory Visit: Payer: Self-pay | Admitting: Pain Medicine

## 2015-07-11 ENCOUNTER — Ambulatory Visit: Payer: Medicare HMO | Attending: Pain Medicine | Admitting: Pain Medicine

## 2015-07-11 ENCOUNTER — Encounter: Payer: Self-pay | Admitting: Pain Medicine

## 2015-07-11 VITALS — BP 124/48 | HR 81 | Temp 98.6°F | Resp 16 | Ht 61.5 in | Wt 205.0 lb

## 2015-07-11 DIAGNOSIS — Z79899 Other long term (current) drug therapy: Secondary | ICD-10-CM

## 2015-07-11 DIAGNOSIS — M545 Low back pain, unspecified: Secondary | ICD-10-CM

## 2015-07-11 DIAGNOSIS — F1190 Opioid use, unspecified, uncomplicated: Secondary | ICD-10-CM

## 2015-07-11 DIAGNOSIS — M79605 Pain in left leg: Secondary | ICD-10-CM

## 2015-07-11 DIAGNOSIS — G8929 Other chronic pain: Secondary | ICD-10-CM

## 2015-07-11 DIAGNOSIS — Z7901 Long term (current) use of anticoagulants: Secondary | ICD-10-CM | POA: Diagnosis not present

## 2015-07-11 DIAGNOSIS — Z7189 Other specified counseling: Secondary | ICD-10-CM | POA: Insufficient documentation

## 2015-07-11 DIAGNOSIS — Z8547 Personal history of malignant neoplasm of testis: Secondary | ICD-10-CM | POA: Diagnosis not present

## 2015-07-11 DIAGNOSIS — I482 Chronic atrial fibrillation: Secondary | ICD-10-CM | POA: Diagnosis not present

## 2015-07-11 DIAGNOSIS — I10 Essential (primary) hypertension: Secondary | ICD-10-CM | POA: Insufficient documentation

## 2015-07-11 DIAGNOSIS — G4733 Obstructive sleep apnea (adult) (pediatric): Secondary | ICD-10-CM | POA: Diagnosis not present

## 2015-07-11 DIAGNOSIS — Z952 Presence of prosthetic heart valve: Secondary | ICD-10-CM | POA: Diagnosis not present

## 2015-07-11 DIAGNOSIS — E785 Hyperlipidemia, unspecified: Secondary | ICD-10-CM | POA: Diagnosis not present

## 2015-07-11 DIAGNOSIS — I872 Venous insufficiency (chronic) (peripheral): Secondary | ICD-10-CM | POA: Diagnosis not present

## 2015-07-11 DIAGNOSIS — I5032 Chronic diastolic (congestive) heart failure: Secondary | ICD-10-CM | POA: Diagnosis not present

## 2015-07-11 DIAGNOSIS — Z79891 Long term (current) use of opiate analgesic: Secondary | ICD-10-CM | POA: Diagnosis not present

## 2015-07-11 DIAGNOSIS — M4806 Spinal stenosis, lumbar region: Secondary | ICD-10-CM | POA: Insufficient documentation

## 2015-07-11 DIAGNOSIS — F1120 Opioid dependence, uncomplicated: Secondary | ICD-10-CM

## 2015-07-11 DIAGNOSIS — F17290 Nicotine dependence, other tobacco product, uncomplicated: Secondary | ICD-10-CM | POA: Insufficient documentation

## 2015-07-11 DIAGNOSIS — Z8673 Personal history of transient ischemic attack (TIA), and cerebral infarction without residual deficits: Secondary | ICD-10-CM | POA: Insufficient documentation

## 2015-07-11 DIAGNOSIS — M109 Gout, unspecified: Secondary | ICD-10-CM | POA: Insufficient documentation

## 2015-07-11 DIAGNOSIS — M5441 Lumbago with sciatica, right side: Secondary | ICD-10-CM

## 2015-07-11 DIAGNOSIS — M5442 Lumbago with sciatica, left side: Secondary | ICD-10-CM

## 2015-07-11 DIAGNOSIS — R52 Pain, unspecified: Secondary | ICD-10-CM | POA: Diagnosis present

## 2015-07-11 DIAGNOSIS — M47816 Spondylosis without myelopathy or radiculopathy, lumbar region: Secondary | ICD-10-CM

## 2015-07-11 DIAGNOSIS — Z5181 Encounter for therapeutic drug level monitoring: Secondary | ICD-10-CM

## 2015-07-11 MED ORDER — METHOCARBAMOL 500 MG PO TABS: 500.0000 mg | ORAL_TABLET | Freq: Three times a day (TID) | ORAL | Status: DC

## 2015-07-11 MED ORDER — OXYCODONE HCL 5 MG PO CAPS: 5.0000 mg | ORAL_CAPSULE | Freq: Three times a day (TID) | ORAL | Status: DC | PRN

## 2015-07-11 NOTE — Progress Notes (Signed)
Pill count: Oxycodone # 31

## 2015-07-11 NOTE — Progress Notes (Signed)
Patient's Name: Doniel Maiello MRN: [id number] DOB: 09/02/1960 DOS: 07/11/2015  Primary Reason(s) for Visit: Encounter for Medication Management. CC: Pain   HPI:   Mr. Walck is a 54 y.o. year old, male patient, who returns today as an established patient. 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; Ascending aortic dissection ([place]); Chronic diastolic congestive heart failure (HCC); 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; Enteritis due to Clostridium difficile; Hypomagnesemia; Physical deconditioning; Chronic pain; Long term current use of opiate analgesic; Long term prescription opiate use; Opiate use; Opiate dependence ([place]); Encounter for therapeutic drug level monitoring; Chronic low back pain; Lumbar facet syndrome; Personal history of transient ischemic attack (TIA), and cerebral infarction without residual deficits; ED (erectile dysfunction) of organic origin; [place]unuch[place]ism; Adult BMI 30+; Obstructive apnea; Testicular hypofunction; Lumbar canal stenosis; Malignant neoplasm of testis ([place]); Type 2 diabetes mellitus ([place]); and Chronic pain of left lower extremity on his problem list.. His primarily concern today is the Pain    The patient returns to the clinic today after last time being seen on 04/10/2015. Around that time, the patient had an issue where he had misplaced one of his prescriptions and initially he and his wife accused [place] of not  having provided him with the prescription. We went back into our records and we found all the evidence that we had in fact provided him with a prescription and that he had signed for it. Later on, he fell on the prescription, but rather than admitting that it was a simple misplacement of the paper, he came back to our clinic's without an appointment and disrupted the flow of the of his dementing that I come out to talk to him. Once I did that, he simply showed me that he had found a prescription and today was a simple misplacement) then him lying tools and trying to obtain more pain medication. At that time I realize that he had completely misinterpreted our conversation since we did not accused him of anything, we simply stated the fact that we were 100% sure that we have provided him with a prescription and that therefore would not be writing another one. In any case, I explained to him and his wife that we cannot have this happening again and that although was more than happy to see him during appointments, he was completely inappropriate for him to show up and disrupt our schedule demanding to see me for something as trivial as this. At that time, I offered him to refer him to another practice. I was under the impression that he had taken my call for, but I see that they showed up to the clinic's today. I did not even mention the event to them, but once again I noticed his wife to be extremely inpatient. Today we took the opportunity to review his medical records and to update his diagnoses and medications. He reports that he is doing well with his current medication regiment and did not voice any interesting changing it. Today's Pain Score: 5  Reported level of pain is incompatible with clinical obrservations. This may be secondary  to a possible lack of understanding on how the pain scale works. I clinical impression is that he is about a 1-2/10 at this point. Pain Type: Chronic pain Pain Location:  Back Pain Orientation: Lower Pain Descriptors / Indicators: Constant, Dull Pain Frequency: Constant  Date of Last Visit: Date of Last Visit: 04/04/15 Service Provided on Last Visit: Service Provided on Last Visit: Med Refill  Pharmacotherapy Review:   Side-effects or Adverse reactions: None reported. Effectiveness: Described as relatively effective, allowing for increase in activities of daily living (ADL). Onset of action: Within expected pharmacological parameters. Duration of action: Within normal limits for medication. Peak effect: Timing and results are as within normal expected parameters. [place] PMP: Compliant with practice rules and regulations. Last UDS available in the system:     Component Value Date/Time   LABOPIA NEG 09/23/2010 2205   COCAINSCRNUR NEG 09/23/2010 2205   LABBENZ NEG 09/23/2010 2205   AMPHETMU NEG 09/23/2010 2205    No components found for: DRUGS OF ABUSE SCREEN W/O ALC DST: Compliant with practice rules and regulations. Lab work: No new labs ordered by our practice. Treatment compliance: Compliant. Substance Use Disorder (SUD) Risk Level: Low Planned course of action: Continue therapy as is.  Allergies: Mr. Viviani is allergic to penicillins and adhesive.  Meds: The patient has a current medication list which includes the following prescription(s): allopurinol, aspirin, atenolol, b complex vitamins, bacitracin, calcium-magnesium-zinc, furosemide, magnesium oxide, methocarbamol, one touch ultra test, potassium chloride, silver sulfadiazine, temazepam, warfarin, oxycodone, oxycodone, and oxycodone. Requested Prescriptions   Signed Prescriptions Disp Refills  . oxycodone (OXY-IR) 5 MG capsule 90 capsule 0    Sig: Take 1 capsule (5 mg total) by mouth every 8 (eight) hours as needed.  Marland Kitchen oxycodone (OXY-IR) 5 MG capsule 90 capsule 0    Sig: Take 1 capsule (5 mg total) by mouth every 8 (eight) hours as needed.  Marland Kitchen oxycodone (OXY-IR) 5 MG capsule 90 capsule 0     Sig: Take 1 capsule (5 mg total) by mouth every 8 (eight) hours as needed.  . methocarbamol (ROBAXIN) 500 MG tablet 90 tablet 2    Sig: Take 1 tablet (500 mg total) by mouth 3 (three) times daily.    ROS: Constitutional: Afebrile, no chills, well hydrated and well nourished Gastrointestinal: negative Musculoskeletal:negative Neurological: negative Behavioral/Psych: negative  PFSH: Medical:  Mr. Altland  has a past medical history of Hyperlipidemia; Hypertension; Arthritis; Leg pain (06/28/2010); Hiatal hernia; Atrial fibrillation ([place]); Reflux (11/06/09); Myocardial infarction [facility]) (age 73); Varicose veins; Obesity; Peripheral vascular disease ([place]); Gout; CAD (coronary artery disease); Chronic LBP (10/08/2011); Impaired glucose tolerance (10/08/2011); Erectile dysfunction (10/08/2011); Testicular cancer ([place]); Bell's palsy; TIA (transient ischemic attack); CVA (cerebral infarction); OSA (obstructive sleep apnea); History of colon polyps; Anemia; Hypogonadism male (04/08/2012); Aortic aneurysm ([place]); Aortic aneurysm and dissection (HCC); 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; Ascending aortic dissection ([place]) (07/14/2008); Severe mitral regurgitation (10/11/2014); Morbid obesity ([place]) (04/02/2014); Diabetes mellitus; DIABETES MELLITUS, TYPE II (11/01/2009); Dysrhythmia; CHF (congestive heart failure) ([place]); Shortness of breath dyspnea; Anxiety; GERD (gastroesophageal reflux disease); Stroke [facility]); Chronic diastolic congestive heart failure (HCC); 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 (1/61/0960); ARDS (adult respiratory distress syndrome) ([place]) (01/27/2015); Acute respiratory failure with hypoxemia ([place]); Asymptomatic chronic venous hypertension (01/15/2010); Cellulitis (04/02/2014); Cerebral artery occlusion with cerebral infarction ([place]) (10/08/2011); Chronic diastolic heart failure (HCC) (03/01/2015); CVA (cerebral vascular accident) ([place]) (10/08/2011);  Encephalopathy acute (02/05/2015); 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);  History of adenomatous polyp of colon (12/23/2011); History of colon polyps (12/23/2011); and H/O mechanical aortic valve replacement (11/01/2009). Family: family history includes Cancer in his maternal aunt and maternal uncle; Diabetes in his father; Heart disease in his mother; Hypertension in his other; Stroke in his other; Throat cancer in his mother. Surgical:  has past surgical history that includes Laser ablation (03/06/2010); Cardiac valve replacement; Cholecystectomy (2011); Tonsillectomy (1967); Otoplasty; Orchiectomy (age 60); TEE without cardioversion (N/A, 10/11/2014); Bentall procedure (1988); Coronary artery bypass graft; aortic truck; TEE without cardioversion (N/A, 01/24/2015); and Mitral valve replacement (Right, 01/24/2015). Tobacco:  reports that he has been smoking Cigars.  He has never used smokeless tobacco. Alcohol:  reports that he drinks alcohol. Drug:  reports that he does not use illicit drugs.  Physical Exam: Vitals:  Today's Vitals   07/11/15 1402 07/11/15 1403  BP: 124/48   Pulse: 81   Temp: 98.6 F (37 C)   TempSrc: Oral   Resp: 16   Height: 5' 1.5" (1.562 m)   Weight: 205 lb (92.987 kg)   SpO2: 97%   PainSc: 5  5   PainLoc: Back   Calculated BMI: Body mass index is 38.11 kg/(m^2). General appearance: alert, cooperative, appears stated age, no distress and moderately obese Eyes: conjunctivae/corneas clear. PERRL, EOM's intact. Fundi benign. Lungs: No evidence respiratory distress, no audible rales or ronchi and no use of accessory muscles of respiration Neck: no adenopathy, no carotid bruit, no JVD, supple, symmetrical, trachea midline and thyroid not enlarged, symmetric, no tenderness/mass/nodules Back: symmetric, no curvature. ROM normal. No CVA tenderness. Extremities: The patient indicates still having some ulcers in both of his feet. Pulses: 2+ and symmetric Skin: Skin  color, texture, turgor normal. No rashes or lesions Neurologic: Grossly normal    Assessment: Encounter Diagnosis:  Primary Diagnosis: Chronic pain [G89.29]  Plan: Hammad was seen today for pain.  Diagnoses and all orders for this visit:  Chronic pain -     Cancel: COMPLETE METABOLIC PANEL WITH GFR; Future -     C-reactive protein; Future -     Magnesium; Future -     Sedimentation rate; Future -     Vitamin D2,D3 Panel; Future -     oxycodone (OXY-IR) 5 MG capsule; Take 1 capsule (5 mg total) by mouth every 8 (eight) hours as needed. -     oxycodone (OXY-IR) 5 MG capsule; Take 1 capsule (5 mg total) by mouth every 8 (eight) hours as needed. -     oxycodone (OXY-IR) 5 MG capsule; Take 1 capsule (5 mg total) by mouth every 8 (eight) hours as needed. -     methocarbamol (ROBAXIN) 500 MG tablet; Take 1 tablet (500 mg total) by mouth 3 (three) times daily.  Long term current use of opiate analgesic -     Drugs of abuse screen w/o alc, rtn urine-sln; Future  Long term prescription opiate use  Opiate use  Uncomplicated opioid dependence ([place])  Encounter for therapeutic drug level monitoring  Chronic low back pain  Lumbar facet syndrome  Chronic pain of left lower extremity     There are no Patient Instructions on file for this visit. Medications discontinued today:  Medications Discontinued During This Encounter  Medication Reason  . oxyCODONE (OXY IR/ROXICODONE) 5 MG immediate release tablet   . methocarbamol (ROBAXIN) 500 MG tablet Reorder   Medications administered today:  Mr. Popowski had no medications administered during this visit.  Primary Care Physician: Selinda Orion Location: [facility]  Outpatient Pain Management Facility Note by: Kathlen Brunswick. Dossie Arbour, M.D, DABA, DABAPM, DABPM, DABIPP, FIPP

## 2015-07-12 DIAGNOSIS — Z8547 Personal history of malignant neoplasm of testis: Secondary | ICD-10-CM | POA: Insufficient documentation

## 2015-07-19 LAB — TOXASSURE SELECT 13 (MW), URINE: PDF: 0

## 2015-07-31 ENCOUNTER — Ambulatory Visit (INDEPENDENT_AMBULATORY_CARE_PROVIDER_SITE_OTHER): Payer: Medicare HMO | Admitting: *Deleted

## 2015-07-31 DIAGNOSIS — I481 Persistent atrial fibrillation: Secondary | ICD-10-CM | POA: Diagnosis not present

## 2015-07-31 DIAGNOSIS — I482 Chronic atrial fibrillation, unspecified: Secondary | ICD-10-CM

## 2015-07-31 DIAGNOSIS — I4819 Other persistent atrial fibrillation: Secondary | ICD-10-CM

## 2015-07-31 DIAGNOSIS — Z5181 Encounter for therapeutic drug level monitoring: Secondary | ICD-10-CM

## 2015-07-31 DIAGNOSIS — Z952 Presence of prosthetic heart valve: Secondary | ICD-10-CM

## 2015-07-31 DIAGNOSIS — Z954 Presence of other heart-valve replacement: Secondary | ICD-10-CM

## 2015-07-31 LAB — POCT INR: INR: 3.4

## 2015-08-10 ENCOUNTER — Other Ambulatory Visit: Payer: Self-pay | Admitting: Pain Medicine

## 2015-08-28 ENCOUNTER — Ambulatory Visit (INDEPENDENT_AMBULATORY_CARE_PROVIDER_SITE_OTHER): Payer: Medicare HMO | Admitting: Pharmacist

## 2015-08-28 DIAGNOSIS — Z954 Presence of other heart-valve replacement: Secondary | ICD-10-CM | POA: Diagnosis not present

## 2015-08-28 DIAGNOSIS — Z952 Presence of prosthetic heart valve: Secondary | ICD-10-CM

## 2015-08-28 DIAGNOSIS — Z5181 Encounter for therapeutic drug level monitoring: Secondary | ICD-10-CM | POA: Diagnosis not present

## 2015-08-28 DIAGNOSIS — I482 Chronic atrial fibrillation, unspecified: Secondary | ICD-10-CM

## 2015-08-28 DIAGNOSIS — I481 Persistent atrial fibrillation: Secondary | ICD-10-CM

## 2015-08-28 DIAGNOSIS — I4819 Other persistent atrial fibrillation: Secondary | ICD-10-CM

## 2015-08-28 LAB — POCT INR: INR: 2.9

## 2015-10-02 ENCOUNTER — Ambulatory Visit (INDEPENDENT_AMBULATORY_CARE_PROVIDER_SITE_OTHER): Payer: Medicare HMO

## 2015-10-02 DIAGNOSIS — Z5181 Encounter for therapeutic drug level monitoring: Secondary | ICD-10-CM | POA: Diagnosis not present

## 2015-10-02 DIAGNOSIS — Z954 Presence of other heart-valve replacement: Secondary | ICD-10-CM | POA: Diagnosis not present

## 2015-10-02 DIAGNOSIS — I4819 Other persistent atrial fibrillation: Secondary | ICD-10-CM

## 2015-10-02 DIAGNOSIS — Z952 Presence of prosthetic heart valve: Secondary | ICD-10-CM

## 2015-10-02 DIAGNOSIS — I481 Persistent atrial fibrillation: Secondary | ICD-10-CM

## 2015-10-02 DIAGNOSIS — I482 Chronic atrial fibrillation, unspecified: Secondary | ICD-10-CM

## 2015-10-02 LAB — POCT INR: INR: 3.7

## 2015-10-09 ENCOUNTER — Ambulatory Visit: Payer: Medicare HMO | Attending: Pain Medicine | Admitting: Pain Medicine

## 2015-10-09 ENCOUNTER — Other Ambulatory Visit: Payer: Self-pay | Admitting: Pain Medicine

## 2015-10-09 ENCOUNTER — Encounter: Payer: Self-pay | Admitting: Pain Medicine

## 2015-10-09 VITALS — BP 137/88 | HR 87 | Temp 99.1°F | Resp 18 | Ht 61.0 in | Wt 199.0 lb

## 2015-10-09 DIAGNOSIS — E785 Hyperlipidemia, unspecified: Secondary | ICD-10-CM | POA: Insufficient documentation

## 2015-10-09 DIAGNOSIS — I872 Venous insufficiency (chronic) (peripheral): Secondary | ICD-10-CM | POA: Insufficient documentation

## 2015-10-09 DIAGNOSIS — Z5181 Encounter for therapeutic drug level monitoring: Secondary | ICD-10-CM | POA: Diagnosis not present

## 2015-10-09 DIAGNOSIS — Z7901 Long term (current) use of anticoagulants: Secondary | ICD-10-CM

## 2015-10-09 DIAGNOSIS — C6290 Malignant neoplasm of unspecified testis, unspecified whether descended or undescended: Secondary | ICD-10-CM | POA: Insufficient documentation

## 2015-10-09 DIAGNOSIS — Z8673 Personal history of transient ischemic attack (TIA), and cerebral infarction without residual deficits: Secondary | ICD-10-CM | POA: Insufficient documentation

## 2015-10-09 DIAGNOSIS — I482 Chronic atrial fibrillation: Secondary | ICD-10-CM | POA: Insufficient documentation

## 2015-10-09 DIAGNOSIS — I252 Old myocardial infarction: Secondary | ICD-10-CM | POA: Insufficient documentation

## 2015-10-09 DIAGNOSIS — M109 Gout, unspecified: Secondary | ICD-10-CM | POA: Diagnosis not present

## 2015-10-09 DIAGNOSIS — G8929 Other chronic pain: Secondary | ICD-10-CM | POA: Insufficient documentation

## 2015-10-09 DIAGNOSIS — I5032 Chronic diastolic (congestive) heart failure: Secondary | ICD-10-CM | POA: Insufficient documentation

## 2015-10-09 DIAGNOSIS — M79603 Pain in arm, unspecified: Secondary | ICD-10-CM | POA: Diagnosis present

## 2015-10-09 DIAGNOSIS — G4733 Obstructive sleep apnea (adult) (pediatric): Secondary | ICD-10-CM | POA: Diagnosis not present

## 2015-10-09 DIAGNOSIS — M545 Low back pain: Secondary | ICD-10-CM | POA: Insufficient documentation

## 2015-10-09 DIAGNOSIS — I10 Essential (primary) hypertension: Secondary | ICD-10-CM | POA: Diagnosis not present

## 2015-10-09 DIAGNOSIS — Z952 Presence of prosthetic heart valve: Secondary | ICD-10-CM | POA: Diagnosis not present

## 2015-10-09 DIAGNOSIS — M4806 Spinal stenosis, lumbar region: Secondary | ICD-10-CM | POA: Diagnosis not present

## 2015-10-09 DIAGNOSIS — F1120 Opioid dependence, uncomplicated: Secondary | ICD-10-CM | POA: Insufficient documentation

## 2015-10-09 LAB — COMPREHENSIVE METABOLIC PANEL
ALBUMIN: 4 g/dL (ref 3.5–5.0)
ALT: 18 U/L (ref 17–63)
AST: 22 U/L (ref 15–41)
Alkaline Phosphatase: 59 U/L (ref 38–126)
Anion gap: 5 (ref 5–15)
BUN: 21 mg/dL — ABNORMAL HIGH (ref 6–20)
CHLORIDE: 98 mmol/L — AB (ref 101–111)
CO2: 34 mmol/L — ABNORMAL HIGH (ref 22–32)
CREATININE: 0.81 mg/dL (ref 0.61–1.24)
Calcium: 9.1 mg/dL (ref 8.9–10.3)
GFR calc Af Amer: >60 mL/min (ref >60)
Glucose, Bld: 77 mg/dL (ref 65–99)
POTASSIUM: 3.9 mmol/L (ref 3.5–5.1)
SODIUM: 137 mmol/L (ref 135–145)
Total Bilirubin: 1.1 mg/dL (ref 0.3–1.2)
Total Protein: 7.4 g/dL (ref 6.5–8.1)

## 2015-10-09 LAB — SEDIMENTATION RATE: Sed Rate: 9 mm/hr (ref 0–20)

## 2015-10-09 LAB — C-REACTIVE PROTEIN: CRP: 0.9 mg/dL (ref <1.0)

## 2015-10-09 LAB — MAGNESIUM: MAGNESIUM: 1.8 mg/dL (ref 1.7–2.4)

## 2015-10-09 MED ORDER — METHOCARBAMOL 500 MG PO TABS: 500.0000 mg | ORAL_TABLET | Freq: Three times a day (TID) | ORAL | Status: DC

## 2015-10-09 MED ORDER — OXYCODONE HCL 5 MG PO TABS: 5.0000 mg | ORAL_TABLET | Freq: Three times a day (TID) | ORAL | Status: DC | PRN

## 2015-10-09 NOTE — Progress Notes (Signed)
Patient's Name: Lance Cook MRN: [id number] DOB: June 20, 1961 DOS: 10/09/2015  Primary Reason(s) for Visit: Encounter for Medication Management CC: Arm Pain   HPI  Lance Cook is a 55 y.o. year old, male patient, who returns today as an established patient. 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; Ascending aortic dissection ([place]); Chronic diastolic congestive heart failure (HCC); 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; Enteritis due to Clostridium difficile; Hypomagnesemia; Physical deconditioning; Chronic pain; Long term current use of opiate analgesic; Long term prescription opiate use; Opiate use; Opiate dependence ([place]); Encounter for therapeutic drug level monitoring; Chronic low back pain; Lumbar facet syndrome; Personal history of transient ischemic attack (TIA), and cerebral infarction without residual deficits; ED (erectile dysfunction) of organic origin; Eunuchoidism; Adult BMI 30+; Obstructive apnea; Testicular hypofunction; Lumbar canal stenosis; Malignant neoplasm of testis ([place]); Type 2 diabetes mellitus ([place]); Chronic pain of left lower extremity; and Personal history of malignant neoplasm of testis on his problem list.. His primarily concern today is the Arm Pain   The patient returns to the clinic today for pharmacological management of his chronic pain. The patient's primary pain is in the center of the lower back and it worsens  with prolonged sitting or prolonged standing but it gets better when he lays down. We talked about several possibilities including discogenic low back pain and what we would have to do about that. After a long conversation he came to the conclusion that he doesn't have any money to go for any of the alternatives provided and he also does not want to have any type of surgeries because of how much it caused him to bridge bolus from his Coumadin to Lovenox. In any case, just to know I have encouraged him to go to a swimming pool and do the "pool test" to the determine if the pain goes away when he eliminates gravity. If it does, it is likely to be secondary to discogenic pain.  Reported Pain Score: 6 , clinically he looks like a 3/10. Reported level is inconsistent with clinical obrservations. Pain Descriptors / Indicators: Dull, Throbbing Pain Frequency: Constant  Date of Last Visit: 07/11/15 Service Provided on Last Visit: Med Refill  Pharmacotherapy  Medication(s): Oxycodone IR 5 mg 1 tablet by mouth every 8 hours when necessary for pain. Onset of action: Within expected pharmacological parameters Time to Peak effect: Timing and results are as within normal expected parameters Analgesic Effect: More than 50% Activity Facilitation: Medication(s) allow patient to sit, stand, walk, and do the basic ADLs Perceived Effectiveness: Described as relatively effective, allowing for increase in activities of daily living (ADL) Side-effects or Adverse reactions: None reported Duration of action: Within normal limits for medication [place] PMP: Compliant with practice rules and regulations UDS Results: His last UDS was done on 07/11/2015 and it came back within normal limits and no unexpected results. UDS Interpretation: Patient appears to be compliant with practice rules and regulations Medication Assessment Form: Reviewed. Patient indicates being compliant with therapy Treatment compliance:  Compliant Substance Use Disorder (SUD) Risk Level: Low Pharmacologic Plan: Continue therapy as is  Lab  Work: Illicit Drugs Lab Results  Component Value Date   COCAINSCRNUR NEG 09/23/2010   PCPSCRNUR NEG 09/23/2010   AMPHETMU NEG 09/23/2010   METHADONE NEG 09/23/2010    Inflammation Markers Lab Results  Component Value Date   ESRSEDRATE 2 12/09/2013    Renal Function Lab Results  Component Value Date   BUN 14 02/15/2015   CREATININE 0.77 02/15/2015   GFRAA >60 02/15/2015   GFRNONAA >60 02/15/2015    Hepatic Function Lab Results  Component Value Date   AST 21 02/09/2015   ALT 16* 02/09/2015   ALBUMIN 2.3* 02/09/2015    Electrolytes Lab Results  Component Value Date   NA 135 02/15/2015   K 3.7 02/15/2015   CL 96* 02/15/2015   CALCIUM 8.2* 02/15/2015   MG 1.7 02/15/2015    Allergies  Lance Cook is allergic to penicillins and adhesive.  Meds  The patient has a current medication list which includes the following prescription(s): allopurinol, aspirin, atenolol, b complex vitamins, bacitracin, calcium-magnesium-zinc, furosemide, magnesium oxide, methocarbamol, one touch ultra test, potassium chloride, silver sulfadiazine, temazepam, warfarin, oxycodone, oxycodone, and oxycodone.  Current Outpatient Prescriptions on File Prior to Visit  Medication Sig  . allopurinol (ZYLOPRIM) 300 MG tablet Take 300 mg by mouth daily.  Marland Kitchen aspirin EC 81 MG EC tablet Take 1 tablet (81 mg total) by mouth daily.  Marland Kitchen atenolol (TENORMIN) 100 MG tablet Take 0.5 tablets (50 mg total) by mouth 2 (two) times daily.  Marland Kitchen b complex vitamins tablet Take 1 tablet by mouth daily.    . bacitracin 500 UNIT/GM ointment Apply topically as needed.  . Calcium-Magnesium-Zinc (CAL-MAG-ZINC PO) Take by mouth daily.    . furosemide (LASIX) 40 MG tablet Take 40 mg daily and may take extra 40 mg if has sob or edema  . magnesium oxide (MAG-OX) 400 (241.3 MG) MG tablet Take 1 tablet (400 mg total) by mouth 2  (two) times daily.  . ONE TOUCH ULTRA TEST test strip   . potassium chloride (K-DUR) 10 MEQ tablet Take 2 tablets (20 mEq total) by mouth 2 (two) times daily.  . silver sulfADIAZINE (SILVADENE) 1 % cream Apply topically as needed.  . temazepam (RESTORIL) 15 MG capsule Take 15 mg by mouth at bedtime as needed for sleep.  Marland Kitchen warfarin (COUMADIN) 5 MG tablet TAKE 1 TO 1 AND 1/2 TABLETS DAILY AS DIRECTED BY COUMADIN CLINIC   No current facility-administered medications on file prior to visit.    ROS  Constitutional: Afebrile, no chills, well hydrated and well nourished Gastrointestinal: negative Musculoskeletal:negative Neurological: negative Behavioral/Psych: negative  PFSH  Medical:  Mr. Barbato  has a past medical history of Hyperlipidemia; Hypertension; Arthritis; Leg pain (06/28/2010); Hiatal hernia; Atrial fibrillation ([place]); Reflux (11/06/09); Myocardial infarction [facility]) (age 1); Varicose veins; Obesity; Peripheral vascular disease ([place]); Gout; CAD (coronary artery disease); Chronic LBP (10/08/2011); Impaired glucose tolerance (10/08/2011); Erectile dysfunction (10/08/2011); Testicular cancer ([place]); Bell's palsy; TIA (transient ischemic attack); CVA (cerebral infarction); OSA (obstructive sleep apnea); History of colon polyps; Anemia; Hypogonadism male (04/08/2012); Aortic aneurysm ([place]); Aortic aneurysm and dissection (HCC); 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; Ascending aortic dissection ([place]) (07/14/2008); Severe mitral regurgitation (10/11/2014); Morbid obesity ([place]) (04/02/2014); Diabetes mellitus; DIABETES MELLITUS, TYPE II (11/01/2009); Dysrhythmia; CHF (congestive heart failure) ([place]); Shortness of breath dyspnea; Anxiety; GERD (gastroesophageal reflux disease); Stroke [facility]); Chronic diastolic congestive heart failure (HCC); 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 ([id number]); ARDS (adult respiratory distress syndrome) ([place]) (  01/27/2015);  Acute respiratory failure with hypoxemia ([place]); Asymptomatic chronic venous hypertension (01/15/2010); Cellulitis (04/02/2014); Cerebral artery occlusion with cerebral infarction ([place]) (10/08/2011); Chronic diastolic heart failure (HCC) (03/01/2015); CVA (cerebral vascular accident) ([place]) (10/08/2011); Encephalopathy acute (02/05/2015); 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); History of adenomatous polyp of colon (12/23/2011); History of colon polyps (12/23/2011); and H/O mechanical aortic valve replacement (11/01/2009). Family: family history includes Cancer in his maternal aunt and maternal uncle; Diabetes in his father; Heart disease in his mother; Hypertension in his other; Stroke in his other; Throat cancer in his mother. Surgical:  has past surgical history that includes Laser ablation (03/06/2010); Cardiac valve replacement; Cholecystectomy (2011); Tonsillectomy (1967); Otoplasty; Orchiectomy (age 63); TEE without cardioversion (N/A, 10/11/2014); Bentall procedure (1988); Coronary artery bypass graft; aortic truck; TEE without cardioversion (N/A, 01/24/2015); and Mitral valve replacement (Right, 01/24/2015). Tobacco:  reports that he has been smoking Cigars.  He has never used smokeless tobacco. Alcohol:  reports that he drinks alcohol. Drug:  reports that he does not use illicit drugs.  Physical Exam  Vitals:  Today's Vitals   10/09/15 1121 10/09/15 1123  BP: 137/88   Pulse: 87   Temp: 99.1 F (37.3 C)   TempSrc: Oral   Resp: 18   Height: 5\' 1"  (1.549 m)   Weight: 199 lb (90.266 kg)   SpO2: 96%   PainSc:  6     Calculated BMI: Body mass index is 37.62 kg/(m^2).  General appearance: alert, cooperative, appears stated age, no distress and moderately obese Eyes: PERLA Respiratory: No evidence respiratory distress, no audible rales or ronchi and no use of accessory muscles of respiration  Cervical Spine Inspection: Normal anatomy Alignment: Symetrical ROM: Adequate   Upper  Extremities Inspection: No gross anomalies detected ROM: Adequate Sensory: Normal Motor: Unremarkable  Thoracic Spine Inspection: No gross anomalies detected Alignment: Symetrical ROM: Adequate Palpation: WNL  Lumbar Spine Inspection: No gross anomalies detected Alignment: Symetrical ROM: Decreased Palpation: WNL Provocative Tests:  Lumbar Hyperextension and rotation test:  Positive bilaterally Gait: Antalgic (limping)  Lower Extremities Inspection: No gross anomalies detected ROM: Grossly normal Sensory:  Normal Motor: Unremarkable  Assessment & Plan  Primary Diagnosis & Pertinent Problem List: The primary encounter diagnosis was Chronic pain. Diagnoses of Encounter for therapeutic drug monitoring, Long term (current) use of anticoagulants, and Chronic low back pain were also pertinent to this visit.  Visit Diagnosis: 1. Chronic pain   2. Encounter for therapeutic drug monitoring   3. Long term (current) use of anticoagulants   4. Chronic low back pain     Assessment: No problem-specific assessment & plan notes found for this encounter.   Plan of Care  Pharmacotherapy (Medications Ordered): Meds ordered this encounter  Medications  . methocarbamol (ROBAXIN) 500 MG tablet    Sig: Take 1 tablet (500 mg total) by mouth 3 (three) times daily.    Dispense:  90 tablet    Refill:  2    Do not place this medication, or any other prescription from our practice, on "Automatic Refill". Patient may have prescription filled one day early if pharmacy is closed on scheduled refill date.  Marland Kitchen oxyCODONE (OXY IR/ROXICODONE) 5 MG immediate release tablet    Sig: Take 1 tablet (5 mg total) by mouth every 8 (eight) hours as needed for moderate pain or severe pain.    Dispense:  90 tablet    Refill:  0    Do not place this medication, or any other prescription from our practice,  on "Automatic Refill". Patient may have prescription filled one day early if pharmacy is closed on  scheduled refill date. Do not fill until: 10/12/15 To last until: 11/11/15  . oxyCODONE (OXY IR/ROXICODONE) 5 MG immediate release tablet    Sig: Take 1 tablet (5 mg total) by mouth every 8 (eight) hours as needed for moderate pain or severe pain.    Dispense:  90 tablet    Refill:  0    Do not place this medication, or any other prescription from our practice, on "Automatic Refill". Patient may have prescription filled one day early if pharmacy is closed on scheduled refill date. Do not fill until: 11/11/15 To last until: 12/11/15  . oxyCODONE (OXY IR/ROXICODONE) 5 MG immediate release tablet    Sig: Take 1 tablet (5 mg total) by mouth every 8 (eight) hours as needed for moderate pain or severe pain.    Dispense:  90 tablet    Refill:  0    Do not place this medication, or any other prescription from our practice, on "Automatic Refill". Patient may have prescription filled one day early if pharmacy is closed on scheduled refill date. Do not fill until: 12/11/15 To last until: 01/10/16    [facility] & Procedure Ordered: Orders Placed This Encounter  Procedures  . Drugs of abuse screen w/o alc, rtn urine-sln    Volume: 10 ml(s). Minimum 3 ml of urine is needed. Document temperature of fresh sample. Indications: Long term (current) use of opiate analgesic (Z79.891) Test#: [id number] (ToxAssure Select-13)  . Comprehensive metabolic panel    Order Specific Question:  Has the patient fasted?    Answer:  No  . C-reactive protein  . Magnesium  . Sedimentation rate  . Vitamin B12    Indication: Bone Pain (M89.9)  . Vitamin D pnl(25-hydrxy+1,25-dihy)-bld    Imaging Ordered: None  Interventional Therapies: Scheduled: None at this time. PRN Procedures: None at this time.    Referral(s) or Consult(s): None at this time.  Medications administered during this visit: Mr. Mcbeth had no medications administered during this visit.  Future Appointments Date Time Provider [place]   10/30/2015 3:30 PM [facility]  02/04/2016 12:30 PM Rexene Alberts, MD [facility]    Primary Care Physician: Selinda Orion Location: [facility] Note by: Kathlen Brunswick Dossie Arbour, M.D, DABA, DABAPM, DABPM, DABIPP, FIPP

## 2015-10-09 NOTE — Progress Notes (Signed)
#  29 Oxycodone remaining

## 2015-10-09 NOTE — Progress Notes (Signed)
Safety precautions to be maintained throughout the outpatient stay will include: orient to surroundings, keep bed in low position, maintain call bell within reach at all times, provide assistance with transfer out of bed and ambulation.  

## 2015-10-10 ENCOUNTER — Encounter: Payer: Self-pay | Admitting: Pain Medicine

## 2015-10-13 NOTE — Progress Notes (Signed)
Quick Note:   Normal chloride levels are between 95 and 107 mEq/L. Low levels may be due to: Addison disease; Bartter syndrome; burns; congestive heart failure; dehydration; excessive sweating; hyperaldosteronism; metabolic alkalosis; respiratory acidosis (compensated); Syndrome of inappropriate diuretic hormone secretion (SIADH); or vomiting.  Most of the CO2 in the body is in the form of bicarbonate (HCO3-). Therefore, the CO2 blood test is really a measure of bicarbonate levels. kidneys help maintain the normal bicarbonate levels. The normal range is between 22 and 28 mEq/L, for our Lab. Higher levels may suggest alkalosis; renal tubular acidosis; breathing disorders; cushing syndrome; and hyperaldosteronism among others.  BUN levels between 7 to 20 mg/dL (2.5 to 7.1 mmol/L) are considered normal. Elevated blood urea nitrogen can also be due to: urinary tract obstruction; congestive heart failure or recent heart attack; gastrointestinal bleeding; dehydration; shock; severe burns; certain medications, such as corticosteroids and some antibiotics; and/or a high protein diet.  BUN-to-creatinine ratio >20:1 (BUN dispropertionally higher than the creatinine levels) suggests prerenal azotemia (dehydration or renal hypoperfusion), while <10:1 levels suggest renal damage.  ______

## 2015-10-13 NOTE — Progress Notes (Signed)
Quick Note:  Lab results reviewed and found to be within normal limits. ______ 

## 2015-10-14 LAB — TOXASSURE SELECT 13 (MW), URINE: PDF: 0

## 2015-10-30 ENCOUNTER — Telehealth (HOSPITAL_BASED_OUTPATIENT_CLINIC_OR_DEPARTMENT_OTHER): Payer: Self-pay | Admitting: Emergency Medicine

## 2015-10-30 ENCOUNTER — Ambulatory Visit (INDEPENDENT_AMBULATORY_CARE_PROVIDER_SITE_OTHER): Payer: Medicare HMO | Admitting: *Deleted

## 2015-10-30 DIAGNOSIS — I482 Chronic atrial fibrillation, unspecified: Secondary | ICD-10-CM

## 2015-10-30 DIAGNOSIS — Z952 Presence of prosthetic heart valve: Secondary | ICD-10-CM

## 2015-10-30 DIAGNOSIS — Z954 Presence of other heart-valve replacement: Secondary | ICD-10-CM

## 2015-10-30 DIAGNOSIS — I481 Persistent atrial fibrillation: Secondary | ICD-10-CM | POA: Diagnosis not present

## 2015-10-30 DIAGNOSIS — Z5181 Encounter for therapeutic drug level monitoring: Secondary | ICD-10-CM | POA: Diagnosis not present

## 2015-10-30 DIAGNOSIS — I4819 Other persistent atrial fibrillation: Secondary | ICD-10-CM

## 2015-10-30 LAB — POCT INR: INR: 6.1

## 2015-10-30 LAB — PROTIME-INR
INR: 5.38 — AB (ref 0.00–1.49)
Prothrombin Time: 47.5 seconds — ABNORMAL HIGH (ref 11.6–15.2)

## 2015-10-30 NOTE — Telephone Encounter (Signed)
Pager by answering service. INR of 6.4 this morning at [facility] clinic. Repeat INR at [facility] 5.38 by venipuncture. Patient is asymptomatic. No bleeding. Patient takes Coumadin 5 mg Monday Wednesday Friday other days he takes 7.5 mg. Will hold Coumadin today. Recheck INR in the clinic tomorrow. If bleeding overnight, goes to ER for further evaluation. Patient agrees. Discuss with Dr. [name].   [place], [place]

## 2015-11-05 ENCOUNTER — Ambulatory Visit (INDEPENDENT_AMBULATORY_CARE_PROVIDER_SITE_OTHER): Payer: Medicare HMO

## 2015-11-05 DIAGNOSIS — Z954 Presence of other heart-valve replacement: Secondary | ICD-10-CM | POA: Diagnosis not present

## 2015-11-05 DIAGNOSIS — Z5181 Encounter for therapeutic drug level monitoring: Secondary | ICD-10-CM

## 2015-11-05 DIAGNOSIS — I482 Chronic atrial fibrillation, unspecified: Secondary | ICD-10-CM

## 2015-11-05 DIAGNOSIS — Z952 Presence of prosthetic heart valve: Secondary | ICD-10-CM

## 2015-11-05 DIAGNOSIS — I481 Persistent atrial fibrillation: Secondary | ICD-10-CM | POA: Diagnosis not present

## 2015-11-05 DIAGNOSIS — I4819 Other persistent atrial fibrillation: Secondary | ICD-10-CM

## 2015-11-05 LAB — POCT INR: INR: 2

## 2015-11-12 ENCOUNTER — Ambulatory Visit (INDEPENDENT_AMBULATORY_CARE_PROVIDER_SITE_OTHER): Payer: Medicare HMO | Admitting: Pharmacist

## 2015-11-12 DIAGNOSIS — Z952 Presence of prosthetic heart valve: Secondary | ICD-10-CM

## 2015-11-12 DIAGNOSIS — I481 Persistent atrial fibrillation: Secondary | ICD-10-CM | POA: Diagnosis not present

## 2015-11-12 DIAGNOSIS — I482 Chronic atrial fibrillation, unspecified: Secondary | ICD-10-CM

## 2015-11-12 DIAGNOSIS — Z5181 Encounter for therapeutic drug level monitoring: Secondary | ICD-10-CM | POA: Diagnosis not present

## 2015-11-12 DIAGNOSIS — I4819 Other persistent atrial fibrillation: Secondary | ICD-10-CM

## 2015-11-12 DIAGNOSIS — Z954 Presence of other heart-valve replacement: Secondary | ICD-10-CM | POA: Diagnosis not present

## 2015-11-12 LAB — POCT INR: INR: 4.1

## 2015-11-23 ENCOUNTER — Ambulatory Visit (INDEPENDENT_AMBULATORY_CARE_PROVIDER_SITE_OTHER): Payer: Medicare HMO | Admitting: *Deleted

## 2015-11-23 DIAGNOSIS — I482 Chronic atrial fibrillation, unspecified: Secondary | ICD-10-CM

## 2015-11-23 DIAGNOSIS — Z954 Presence of other heart-valve replacement: Secondary | ICD-10-CM

## 2015-11-23 DIAGNOSIS — I481 Persistent atrial fibrillation: Secondary | ICD-10-CM | POA: Diagnosis not present

## 2015-11-23 DIAGNOSIS — I4819 Other persistent atrial fibrillation: Secondary | ICD-10-CM

## 2015-11-23 DIAGNOSIS — Z5181 Encounter for therapeutic drug level monitoring: Secondary | ICD-10-CM | POA: Diagnosis not present

## 2015-11-23 DIAGNOSIS — Z952 Presence of prosthetic heart valve: Secondary | ICD-10-CM

## 2015-11-23 LAB — POCT INR: INR: 2.3

## 2015-12-06 ENCOUNTER — Telehealth: Payer: Self-pay | Admitting: Pharmacist

## 2015-12-06 NOTE — Telephone Encounter (Signed)
Pt called to report he is scheduled to have 2 teeth extraction by Dr. Alford Highland on 12/19/15.  Pt stated he would need to be off Coumadin.  He will require Lovenox bridge if he stops Coumadin due to history of AVR and Afib.  Pt has ~12 syringes at home and is unable to afford any more injections.    Spoke with Dr. Durenda Age office.  Confirmed patient is scheduled for surgical excision of 2 teeth.  They would prefer pt to be off Coumadin x 5 days.  Will send fax to Dr. Wynetta Emery to ensure he is comfortable with patient holding Coumadin for this period, understanding he will have to do Lovenox bridge and will be fully anticoagulated until right before extractions and again 24 hours after procedure.  Other option would be to adjust Coumadin dose for lower INR goal (2.0-2.5) to avoid Lovenox bridge.  Dr. Wynetta Emery is out of the office until Monday.  Will await his response and then instruct patient at that time.

## 2015-12-07 ENCOUNTER — Telehealth: Payer: Self-pay | Admitting: *Deleted

## 2015-12-07 NOTE — Telephone Encounter (Signed)
Called pt back and during conversation pt presented with another concern. Pt asking if Dr [name] would sign a Disability form for homestead tax deduction on property taxes. Pt has recently switched PCP's. Pt stated he had tried to get his new PCP to sign it but she is reluctant to because she is a NP. The form is due in June and the pt wants to make sure he meets the deadline. Please advise.  Pt was also due to 6 month f/u and appt has been scheduled for 5/5 @ 4:15.  Will route to Dr. [name] and his nurse.

## 2015-12-07 NOTE — Telephone Encounter (Signed)
Yes I will

## 2015-12-07 NOTE — Telephone Encounter (Signed)
Called pt back. Shared the below message that pharmacist is waiting on Dr Durenda Age return on Monday before starting the needed appropriate therapy. Told patient to expect call on Monday and to call back if he does not hear anything.   Also, saw pt due for 6 month f/u with Dr [name], appt scheduled for 01/04/16 with pt.    Will route to [facility].

## 2015-12-07 NOTE — Telephone Encounter (Signed)
Follow up     Pt states he is returning a call to Dr [employer] nurse

## 2015-12-10 ENCOUNTER — Ambulatory Visit (INDEPENDENT_AMBULATORY_CARE_PROVIDER_SITE_OTHER): Payer: Medicare HMO | Admitting: *Deleted

## 2015-12-10 DIAGNOSIS — I481 Persistent atrial fibrillation: Secondary | ICD-10-CM

## 2015-12-10 DIAGNOSIS — I482 Chronic atrial fibrillation, unspecified: Secondary | ICD-10-CM

## 2015-12-10 DIAGNOSIS — I4819 Other persistent atrial fibrillation: Secondary | ICD-10-CM

## 2015-12-10 DIAGNOSIS — Z954 Presence of other heart-valve replacement: Secondary | ICD-10-CM

## 2015-12-10 DIAGNOSIS — Z952 Presence of prosthetic heart valve: Secondary | ICD-10-CM

## 2015-12-10 DIAGNOSIS — Z5181 Encounter for therapeutic drug level monitoring: Secondary | ICD-10-CM | POA: Diagnosis not present

## 2015-12-10 LAB — POCT INR: INR: 2.2

## 2015-12-10 NOTE — Patient Instructions (Addendum)
4/14- Last day of Coumadin 4/15- No Coumadin or Lovenox  4/16- Lovenox 100mg  in AM and PM 4/17- Lovenox 100mg  in AM 4/18- Day of procedure.  Take Coumadin 2 tablets that night 4/19-Lovenox 100mg  in AM and PM AND Coumadin 1.5 tablets 4/20- Lovenox 100mg  in AM and PM AND Coumadin 1.5 tablets  4/21- Lovenox 100mg  in AM and PM AND Coumadin 1 tablet  4/22- Lovenox 100mg  in AM and PM AND Coumadin 1 tablet  4/23- Lovenox 100mg  in AM and PM AND Coumadin 1 1/2 tablets  4/24- Recheck INR

## 2015-12-10 NOTE — Telephone Encounter (Signed)
Spoke with [name] with Dr. Durenda Age office.  He is on vacation until 4/17 rather than 4/10.  Pt's procedure is on 4/19.  Will go ahead and proceed with holding Coumadin and Lovenox bridge since we do not have time to wait on the response from Dr. Wynetta Emery before he is back from vacation.

## 2015-12-10 NOTE — Telephone Encounter (Signed)
Called pt back and told him Dr [name] will sign the form. Pt stated he will bring it to his upcoming appt.

## 2015-12-17 ENCOUNTER — Encounter: Payer: Self-pay | Admitting: *Deleted

## 2015-12-17 ENCOUNTER — Telehealth: Payer: Self-pay | Admitting: Cardiology

## 2015-12-17 NOTE — Telephone Encounter (Signed)
Mr.Foland is calling to get clearance for his tooth extraction , that is schedule for tomorrow morning, If possible please fax over a release form to Dr. Kendrick Fries at [phone]. Please call if you have any questions   Thanks

## 2015-12-17 NOTE — Telephone Encounter (Signed)
As discussed w Lance Cook - no formal clearance by Dr. [name], but we can furnish a letter stating that pt has complied w/ bridging protocol for coumadin.  Letter sent to Dr. Landry Corporal office.

## 2015-12-17 NOTE — Telephone Encounter (Signed)
New message      Talk to Gay Filler regarding his lovenox bridge

## 2015-12-17 NOTE — Telephone Encounter (Signed)
Follow up      Pt is calling to say his appt is at 8:15 in the morning.  He states he is already on a lovenox bridge and also already on an antibiotic.  Please fax clearance so that he will not have to cancel his appt.  See previous message for details

## 2015-12-17 NOTE — Telephone Encounter (Signed)
Pt called to say that his general dentist is not doing his procedure, he needs an oral Psychologist, sport and exercise. The oral surgeon is out for Easter and won't be in until tomorrow. Procedure date will be moved to ? Date. Advised pt to continue on his Lovenox injections twice daily, continue holding his Coumadin, and to contact us as soon as he knows when his procedure date is set so that we can adjust his Lovenox dosing instructions.

## 2015-12-17 NOTE — Telephone Encounter (Signed)
Pt needs clearance faxed to Dr. Landry Corporal office. He is already on the lovenox bridge as instructed  Will confer w/ Erasmo Downer

## 2015-12-24 ENCOUNTER — Ambulatory Visit (INDEPENDENT_AMBULATORY_CARE_PROVIDER_SITE_OTHER): Payer: Medicare HMO | Admitting: *Deleted

## 2015-12-24 DIAGNOSIS — I482 Chronic atrial fibrillation, unspecified: Secondary | ICD-10-CM

## 2015-12-24 DIAGNOSIS — Z954 Presence of other heart-valve replacement: Secondary | ICD-10-CM

## 2015-12-24 DIAGNOSIS — I4819 Other persistent atrial fibrillation: Secondary | ICD-10-CM

## 2015-12-24 DIAGNOSIS — I481 Persistent atrial fibrillation: Secondary | ICD-10-CM

## 2015-12-24 DIAGNOSIS — Z5181 Encounter for therapeutic drug level monitoring: Secondary | ICD-10-CM

## 2015-12-24 DIAGNOSIS — Z952 Presence of prosthetic heart valve: Secondary | ICD-10-CM

## 2015-12-24 LAB — POCT INR: INR: 2.4
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
## 2016-01-04 ENCOUNTER — Ambulatory Visit (INDEPENDENT_AMBULATORY_CARE_PROVIDER_SITE_OTHER): Payer: Medicare HMO | Admitting: Cardiology

## 2016-01-04 ENCOUNTER — Encounter: Payer: Self-pay | Admitting: Cardiology

## 2016-01-04 VITALS — BP 108/58 | HR 91 | Ht 61.0 in | Wt 216.0 lb

## 2016-01-04 DIAGNOSIS — I38 Endocarditis, valve unspecified: Secondary | ICD-10-CM | POA: Diagnosis not present

## 2016-01-04 DIAGNOSIS — R5383 Other fatigue: Secondary | ICD-10-CM | POA: Diagnosis not present

## 2016-01-04 NOTE — Patient Instructions (Signed)
Your physician wants you to follow-up in: 4 months You will receive a reminder letter in the mail two months in advance. If you don't receive a letter, please call our office to schedule the follow-up appointment.  Your physician has requested that you have an echocardiogram. Echocardiography is a painless test that uses sound waves to create images of your heart. It provides your doctor with information about the size and shape of your heart and how well your heart's chambers and valves are working. This procedure takes approximately one hour. There are no restrictions for this procedure.  Your physician recommends that you return for lab work in: sed rate

## 2016-01-07 ENCOUNTER — Encounter: Payer: Self-pay | Admitting: Pain Medicine

## 2016-01-07 ENCOUNTER — Ambulatory Visit: Payer: Medicare HMO | Attending: Pain Medicine | Admitting: Pain Medicine

## 2016-01-07 VITALS — BP 106/43 | HR 80 | Temp 98.4°F | Resp 16 | Ht 61.0 in | Wt 213.0 lb

## 2016-01-07 DIAGNOSIS — I4891 Unspecified atrial fibrillation: Secondary | ICD-10-CM | POA: Insufficient documentation

## 2016-01-07 DIAGNOSIS — I2510 Atherosclerotic heart disease of native coronary artery without angina pectoris: Secondary | ICD-10-CM | POA: Insufficient documentation

## 2016-01-07 DIAGNOSIS — M791 Myalgia: Secondary | ICD-10-CM

## 2016-01-07 DIAGNOSIS — N529 Male erectile dysfunction, unspecified: Secondary | ICD-10-CM | POA: Diagnosis not present

## 2016-01-07 DIAGNOSIS — G4733 Obstructive sleep apnea (adult) (pediatric): Secondary | ICD-10-CM | POA: Insufficient documentation

## 2016-01-07 DIAGNOSIS — R600 Localized edema: Secondary | ICD-10-CM | POA: Insufficient documentation

## 2016-01-07 DIAGNOSIS — Z952 Presence of prosthetic heart valve: Secondary | ICD-10-CM | POA: Insufficient documentation

## 2016-01-07 DIAGNOSIS — M25519 Pain in unspecified shoulder: Secondary | ICD-10-CM | POA: Diagnosis present

## 2016-01-07 DIAGNOSIS — E785 Hyperlipidemia, unspecified: Secondary | ICD-10-CM | POA: Insufficient documentation

## 2016-01-07 DIAGNOSIS — I10 Essential (primary) hypertension: Secondary | ICD-10-CM | POA: Diagnosis not present

## 2016-01-07 DIAGNOSIS — Z8547 Personal history of malignant neoplasm of testis: Secondary | ICD-10-CM | POA: Insufficient documentation

## 2016-01-07 DIAGNOSIS — Z5181 Encounter for therapeutic drug level monitoring: Secondary | ICD-10-CM | POA: Diagnosis not present

## 2016-01-07 DIAGNOSIS — Z951 Presence of aortocoronary bypass graft: Secondary | ICD-10-CM | POA: Insufficient documentation

## 2016-01-07 DIAGNOSIS — Z7901 Long term (current) use of anticoagulants: Secondary | ICD-10-CM | POA: Diagnosis not present

## 2016-01-07 DIAGNOSIS — I509 Heart failure, unspecified: Secondary | ICD-10-CM | POA: Insufficient documentation

## 2016-01-07 DIAGNOSIS — R918 Other nonspecific abnormal finding of lung field: Secondary | ICD-10-CM | POA: Insufficient documentation

## 2016-01-07 DIAGNOSIS — M549 Dorsalgia, unspecified: Secondary | ICD-10-CM | POA: Diagnosis present

## 2016-01-07 DIAGNOSIS — I719 Aortic aneurysm of unspecified site, without rupture: Secondary | ICD-10-CM | POA: Diagnosis not present

## 2016-01-07 DIAGNOSIS — M4806 Spinal stenosis, lumbar region: Secondary | ICD-10-CM | POA: Diagnosis not present

## 2016-01-07 DIAGNOSIS — I252 Old myocardial infarction: Secondary | ICD-10-CM | POA: Diagnosis not present

## 2016-01-07 DIAGNOSIS — Z8673 Personal history of transient ischemic attack (TIA), and cerebral infarction without residual deficits: Secondary | ICD-10-CM | POA: Insufficient documentation

## 2016-01-07 DIAGNOSIS — L98499 Non-pressure chronic ulcer of skin of other sites with unspecified severity: Secondary | ICD-10-CM | POA: Diagnosis not present

## 2016-01-07 DIAGNOSIS — F1190 Opioid use, unspecified, uncomplicated: Secondary | ICD-10-CM

## 2016-01-07 DIAGNOSIS — Z79891 Long term (current) use of opiate analgesic: Secondary | ICD-10-CM | POA: Insufficient documentation

## 2016-01-07 DIAGNOSIS — M109 Gout, unspecified: Secondary | ICD-10-CM | POA: Insufficient documentation

## 2016-01-07 DIAGNOSIS — G8929 Other chronic pain: Secondary | ICD-10-CM | POA: Insufficient documentation

## 2016-01-07 DIAGNOSIS — R079 Chest pain, unspecified: Secondary | ICD-10-CM | POA: Diagnosis present

## 2016-01-07 DIAGNOSIS — M7918 Myalgia, other site: Secondary | ICD-10-CM

## 2016-01-07 MED ORDER — METHOCARBAMOL 500 MG PO TABS: 500.0000 mg | ORAL_TABLET | Freq: Three times a day (TID) | ORAL | Status: DC

## 2016-01-07 MED ORDER — OXYCODONE HCL 5 MG PO TABS: 5.0000 mg | ORAL_TABLET | Freq: Three times a day (TID) | ORAL | Status: DC | PRN

## 2016-01-07 NOTE — Progress Notes (Signed)
Patient's Name: Lance Cook  Patient type: Established  MRN: [place]:[id number]  Service setting: Ambulatory outpatient  DOB: October 28, 1960  Location: [facility]  DOS: 01/07/2016  Primary Care Physician: [name], PA-C  Note by: Kathlen Brunswick. Dossie Arbour, M.D, DABA, DABAPM, DABPM, Milagros Evener, [place]  Referring Physician: Selinda Orion  Specialty: Board-Certified Interventional Pain Management  Last Visit to Pain Management: 10/09/2015   Primary Reason(s) for Visit: Encounter for prescription drug management (Level of risk: moderate) CC: Back Pain; Shoulder Pain; Hand Pain; and Chest Pain   HPI  [name] is a 55 y.o. year old, male patient, who returns today as an established patient. He has DYSLIPIDEMIA; Gout; Essential hypertension; MYOCARDIAL INFARCTION, HX OF; Atrial fibrillation, chronic; Chronic venous hypertension with ulcer ([place]); PERIPHERAL EDEMA; CAD (coronary artery disease); Lumbar spinal stenosis (Severe L4-5); Testicular cancer ([place]); TIA (transient ischemic attack); OSA (obstructive sleep apnea); Hypogonadism male; Long term (current) use of anticoagulants; Encounter for therapeutic drug monitoring; Venous insufficiency of both lower extremities; Morbid obesity (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; Enteritis due to Clostridium difficile; Hypomagnesemia; Physical deconditioning; Chronic pain; Long term current use of opiate analgesic; Long term prescription opiate use; Opiate use (22.5 MME/day); Opiate dependence ([place]); Encounter for therapeutic drug level monitoring; Chronic low back pain (midline); Lumbar facet syndrome; Personal history of transient ischemic attack (TIA), and cerebral infarction without residual deficits; ED (erectile dysfunction) of organic origin;  Eunuchoidism; Adult BMI 30+; Obstructive apnea; Testicular hypofunction; Lumbar canal stenosis; Malignant neoplasm of testis ([place]); Type 2 diabetes mellitus ([place]); Chronic lower extremity pain (Left); Personal history of malignant neoplasm of testis; and Musculoskeletal pain on his problem list.. His primarily concern today is the Back Pain; Shoulder Pain; Hand Pain; and Chest Pain   Pain Assessment: Self-Reported Pain Score: 7 , clinically he looks like a 3/10. Reported level is inconsistent with clinical obrservations Pain Type: Chronic pain Pain Location: Back Pain Orientation: Lower Pain Descriptors / Indicators: Aching, Penetrating, Stabbing, Pressure, Radiating Pain Frequency: Constant  The patient comes into the clinics today for pharmacological management of his chronic pain. I last saw this patient on 10/09/2015. His body mass index is 40.27 kg/(m^2). The patient  reports that he does not use illicit drugs.  Date of Last Visit: 10/09/15 Service Provided on Last Visit: Med Refill  Controlled Substance Pharmacotherapy Assessment & REMS (Risk Evaluation and Mitigation Strategy)  Analgesic: Oxycodone IR 5mg  q8hrs (15 mg/day of oxycodone) Pill Count: Pill count is 32/90 of oxycodone 5 mg tablets filled On 12/19/2015.robxin is 88.5/90 tablets 500 mg filled on 01/06/2016 MME/day: 22.5 mg/day Pharmacokinetics: Onset of action (Liberation/Absorption): Within expected pharmacological parameters Time to Peak effect (Distribution): Timing and results are as within normal expected parameters Duration of action (Metabolism/Excretion): Within normal limits for medication Pharmacodynamics: Analgesic Effect: More than 50% Activity Facilitation: Medication(s) allow patient to sit, stand, walk, and do the basic ADLs Perceived Effectiveness: Described as relatively effective, allowing for increase in activities of daily living (ADL) Side-effects or Adverse reactions: None reported Monitoring: [place]  PMP: Online review of the past 20-month period conducted. Compliant with practice rules and regulations UDS Results/interpretation: Last UDS done on 10/09/15, results are WNL, except for BNZ.  Medication Assessment Form: Reviewed. Patient indicates being compliant with therapy Treatment compliance: Compliant Risk Assessment: Aberrant Behavior: None observed today Substance Use Disorder (SUD) Risk  Level: No change since last visit Risk of opioid abuse or dependence: 0.7-3.0% with doses ? 36 MME/day and 6.1-26% with doses ? 120 MME/day. Opioid Risk Tool (ORT) Score: Total Score: 0 Low Risk for SUD (Score <3) Depression Scale Score: PHQ-2: PHQ-2 Total Score: 0 No depression (0) PHQ-9: PHQ-9 Total Score: 0 No depression (0-4)  Pharmacologic Plan: No change in therapy, at this time  Laboratory Chemistry  Inflammation Markers Lab Results  Component Value Date   ESRSEDRATE 9 10/09/2015   CRP 0.9 10/09/2015    Renal Function Lab Results  Component Value Date   BUN 21* 10/09/2015   CREATININE 0.81 10/09/2015   GFRAA >60 10/09/2015   GFRNONAA >60 10/09/2015    Hepatic Function Lab Results  Component Value Date   AST 22 10/09/2015   ALT 18 10/09/2015   ALBUMIN 4.0 10/09/2015    Electrolytes Lab Results  Component Value Date   NA 137 10/09/2015   K 3.9 10/09/2015   CL 98* 10/09/2015   CALCIUM 9.1 10/09/2015   MG 1.8 10/09/2015    Pain Modulating Vitamins Lab Results  Component Value Date   VITAMINB12 371 01/07/2012    Coagulation Parameters Lab Results  Component Value Date   INR 2.4 12/24/2015   LABPROT 47.5* 10/30/2015    Note: I personally reviewed the above data. Results made available to patient.  Recent Diagnostic Imaging  Dg Chest 2 View  04/30/2015  CLINICAL DATA:  s/p minimally invasive mitral valve 01/24/2015; c/o some SOB with exertion since procedure; hx HTN, a-fib, CHF EXAM: CHEST  2 VIEW COMPARISON:  02/26/15 FINDINGS: Status post cardiac surgery  and mitral valve replacement. Cardiac silhouette is mildly enlarged. Aorta is mildly uncoiled. No mediastinal or hilar masses or evidence of adenopathy. There is central vascular congestion similar to the prior study. Mild interstitial prominence is stable. No overt pulmonary edema. No lung consolidation is seen to suggest pneumonia. No pleural effusion or pneumothorax. Skeletal structures are demineralized but grossly intact. IMPRESSION: 1. No acute cardiopulmonary disease. 2. Stable changes from previous cardiac surgery and mitral valve replacement. No change from the prior chest radiograph. Electronically Signed   By: Lajean Manes M.D.   On: 04/30/2015 11:44    Meds  The patient has a current medication list which includes the following prescription(s): allopurinol, atenolol, b complex vitamins, bacitracin, calcium-magnesium-zinc, clindamycin, furosemide, magnesium oxide, methocarbamol, one touch ultra test, oxycodone, oxycodone, oxycodone, potassium chloride, silver sulfadiazine, temazepam, and warfarin.  Current Outpatient Prescriptions on File Prior to Visit  Medication Sig  . allopurinol (ZYLOPRIM) 300 MG tablet Take 300 mg by mouth daily.  Marland Kitchen atenolol (TENORMIN) 100 MG tablet Take 0.5 tablets (50 mg total) by mouth 2 (two) times daily.  Marland Kitchen b complex vitamins tablet Take 1 tablet by mouth daily.    . bacitracin 500 UNIT/GM ointment Apply topically as needed.  . Calcium-Magnesium-Zinc (CAL-MAG-ZINC PO) Take by mouth daily.    . furosemide (LASIX) 40 MG tablet Take 40 mg daily and may take extra 40 mg if has sob or edema  . magnesium oxide (MAG-OX) 400 (241.3 MG) MG tablet Take 1 tablet (400 mg total) by mouth 2 (two) times daily.  . ONE TOUCH ULTRA TEST test strip   . potassium chloride (K-DUR) 10 MEQ tablet Take 2 tablets (20 mEq total) by mouth 2 (two) times daily.  . silver sulfADIAZINE (SILVADENE) 1 % cream Apply topically as needed.  . temazepam (RESTORIL) 15 MG capsule Take 15 mg by  mouth  at bedtime as needed for sleep.  Marland Kitchen warfarin (COUMADIN) 5 MG tablet TAKE 1 TO 1 AND 1/2 TABLETS DAILY AS DIRECTED BY COUMADIN CLINIC   No current facility-administered medications on file prior to visit.    ROS  Constitutional: Denies any fever or chills Gastrointestinal: No reported hemesis, hematochezia, vomiting, or acute GI distress Musculoskeletal: Denies any acute onset joint swelling, redness, loss of ROM, or weakness Neurological: No reported episodes of acute onset apraxia, aphasia, dysarthria, agnosia, amnesia, paralysis, loss of coordination, or loss of consciousness  Allergies  Mr. Lohse is allergic to penicillins and adhesive.  [place]  Medical:  Mr. Clinkscales  has a past medical history of Hyperlipidemia; Hypertension; Arthritis; Leg pain (06/28/2010); Hiatal hernia; Atrial fibrillation ([place]); Reflux (11/06/09); Myocardial infarction [facility]) (age 61); Varicose veins; Obesity; Peripheral vascular disease ([place]); Gout; CAD (coronary artery disease); Chronic LBP (10/08/2011); Impaired glucose tolerance (10/08/2011); Erectile dysfunction (10/08/2011); Testicular cancer ([place]); Bell's palsy; TIA (transient ischemic attack); CVA (cerebral infarction); OSA (obstructive sleep apnea); History of colon polyps; Anemia; Hypogonadism male (04/08/2012); Aortic aneurysm ([place]); Aortic aneurysm and dissection (HCC); 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; Ascending aortic dissection ([place]) (07/14/2008); Severe mitral regurgitation (10/11/2014); Morbid obesity ([place]) (04/02/2014); Diabetes mellitus; DIABETES MELLITUS, TYPE II (11/01/2009); Dysrhythmia; CHF (congestive heart failure) ([place]); Shortness of breath dyspnea; Anxiety; GERD (gastroesophageal reflux disease); Stroke [facility]); Chronic diastolic congestive heart failure (HCC); 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 ([id number]); ARDS (adult respiratory distress syndrome) ([place]) (01/27/2015); Acute respiratory  failure with hypoxemia ([place]); Asymptomatic chronic venous hypertension (01/15/2010); Cellulitis (04/02/2014); Cerebral artery occlusion with cerebral infarction ([place]) (10/08/2011); Chronic diastolic heart failure (HCC) (03/01/2015); CVA (cerebral vascular accident) ([place]) (10/08/2011); Encephalopathy acute (02/05/2015); 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); History of adenomatous polyp of colon (12/23/2011); History of colon polyps (12/23/2011); and H/O mechanical aortic valve replacement (11/01/2009). Family: family history includes Cancer in his maternal aunt and maternal uncle; Diabetes in his father; Heart disease in his mother; Hypertension in his other; Stroke in his other; Throat cancer in his mother. Surgical:  has past surgical history that includes Laser ablation (03/06/2010); Cardiac valve replacement; Cholecystectomy (2011); Tonsillectomy (1967); Otoplasty; Orchiectomy (age 39); TEE without cardioversion (N/A, 10/11/2014); Bentall procedure (1988); Coronary artery bypass graft; aortic truck; TEE without cardioversion (N/A, 01/24/2015); and Mitral valve replacement (Right, 01/24/2015). Tobacco:  reports that he has been smoking Cigars.  He has never used smokeless tobacco. Alcohol:  reports that he drinks alcohol. Drug:  reports that he does not use illicit drugs.  Constitutional Exam  Vitals: Blood pressure 106/43, pulse 80, temperature 98.4 F (36.9 C), temperature source Oral, resp. rate 16, height 5\' 1"  (1.549 m), weight 213 lb (96.616 kg), SpO2 100 %. General appearance: Well nourished, well developed, and well hydrated. In no acute distress Calculated BMI/Body habitus: Body mass index is 40.27 kg/(m^2). (>40 kg/m2) Extreme obesity (Class III) - 254% higher incidence of chronic pain Psych/Mental status: Alert and oriented x 3 (person, place, & time) Eyes: PERLA Respiratory: No evidence of acute respiratory distress  Cervical Spine Exam  Inspection: No masses, redness, or  swelling Alignment: Symmetrical ROM: Functional: Adequate ROM Active: Unrestricted ROM Stability: No instability detected Muscle strength & Tone: Functionally intact Sensory: Unimpaired Palpation: No complaints of tenderness  Upper Extremity (UE) Exam    Side: Right upper extremity  Side: Left upper extremity  Inspection: No masses, redness, swelling, or asymmetry  Inspection: No masses, redness, swelling, or asymmetry  ROM:  ROM:  Functional: Adequate ROM  Functional: Adequate ROM  Active: Unrestricted ROM  Active: Unrestricted ROM  Muscle strength & Tone: Functionally intact  Muscle strength & Tone: Functionally intact  Sensory: Unimpaired  Sensory: Unimpaired  Palpation: Non-contributory  Palpation: Non-contributory   Thoracic Spine Exam  Inspection: No masses, redness, or swelling Alignment: Symmetrical ROM: Functional: Adequate ROM Active: Unrestricted ROM Stability: No instability detected Sensory: Unimpaired Muscle strength & Tone: Functionally intact Palpation: No complaints of tenderness  Lumbar Spine Exam  Inspection: No masses, redness, or swelling Alignment: Symmetrical ROM: Functional: Adequate ROM Active: Unrestricted ROM Stability: No instability detected Muscle strength & Tone: Functionally intact Sensory: Unimpaired Palpation: No complaints of tenderness Provocative Tests: Lumbar Hyperextension and rotation test: Positive bilaterally for facet pain. Patrick's Maneuver: deferred  Gait & Posture Assessment  Gait: Unaffected Posture: WNL  Lower Extremity Exam    Side: Right lower extremity  Side: Left lower extremity  Inspection: No masses, redness, swelling, or asymmetry ROM:  Inspection: No masses, redness, swelling, or asymmetry ROM:  Functional: Adequate ROM  Functional: Adequate ROM  Active: Unrestricted ROM  Active: Unrestricted ROM  Muscle strength & Tone: Functionally intact  Muscle strength & Tone: Functionally intact  Sensory:  Unimpaired  Sensory: Unimpaired  Palpation: Non-contributory  Palpation: Non-contributory   Assessment & Plan  Primary Diagnosis & Pertinent Problem List: The primary encounter diagnosis was Chronic pain. Diagnoses of Encounter for therapeutic drug monitoring, Long term current use of opiate analgesic, Musculoskeletal pain, and Opiate use (22.5 MME/day) were also pertinent to this visit.  Visit Diagnosis: 1. Chronic pain   2. Encounter for therapeutic drug monitoring   3. Long term current use of opiate analgesic   4. Musculoskeletal pain   5. Opiate use (22.5 MME/day)     Problems updated and reviewed during this visit: Problem  Musculoskeletal Pain  Opiate use (22.5 MME/day)  PERIPHERAL EDEMA   Overview:  Overview:  Qualifier: Diagnosis of  By: [name] MD, [place]   Last Assessment & Plan:  We discussed conservative therapies of this. Overview:  Overview:  Qualifier: Diagnosis of  By: [name] MD, [place]   Last Assessment & Plan:  We discussed conservative therapies of this. Qualifier: Diagnosis of  By: [name] MD, [place]       Problem-specific Plan(s): No problem-specific assessment & plan notes found for this encounter.  No new assessment & plan notes have been filed under this hospital service since the last note was generated. Service: Pain Management   Plan of Care   Problem List Items Addressed This Visit      High   Chronic pain - Primary (Chronic)   Relevant Medications   methocarbamol (ROBAXIN) 500 MG tablet   oxyCODONE (OXY IR/ROXICODONE) 5 MG immediate release tablet   oxyCODONE (OXY IR/ROXICODONE) 5 MG immediate release tablet   oxyCODONE (OXY IR/ROXICODONE) 5 MG immediate release tablet   Musculoskeletal pain (Chronic)   Relevant Medications   methocarbamol (ROBAXIN) 500 MG tablet     Medium   Encounter for therapeutic drug monitoring   Long term current use of opiate analgesic (Chronic)   Relevant Orders   ToxASSURE Select 13  (MW), Urine   Opiate use (22.5 MME/day) (Chronic)       Pharmacotherapy (Medications Ordered): Meds ordered this encounter  Medications  . methocarbamol (ROBAXIN) 500 MG tablet    Sig: Take 1 tablet (500 mg total) by mouth 3 (three) times daily.    Dispense:  90 tablet    Refill:  2    Do not place this medication, or any other prescription from our practice, on "Automatic Refill". Patient may have prescription filled one day early if pharmacy is closed on scheduled refill date.  Marland Kitchen oxyCODONE (OXY IR/ROXICODONE) 5 MG immediate release tablet    Sig: Take 1 tablet (5 mg total) by mouth every 8 (eight) hours as needed for moderate pain or severe pain.    Dispense:  90 tablet    Refill:  0    Do not place this medication, or any other prescription from our practice, on "Automatic Refill". Patient may have prescription filled one day early if pharmacy is closed on scheduled refill date. Do not fill until: 01/17/16 To last until: 02/16/16  . oxyCODONE (OXY IR/ROXICODONE) 5 MG immediate release tablet    Sig: Take 1 tablet (5 mg total) by mouth every 8 (eight) hours as needed for moderate pain or severe pain.    Dispense:  90 tablet    Refill:  0    Do not place this medication, or any other prescription from our practice, on "Automatic Refill". Patient may have prescription filled one day early if pharmacy is closed on scheduled refill date. Do not fill until: 02/16/16 To last until: 03/17/16  . oxyCODONE (OXY IR/ROXICODONE) 5 MG immediate release tablet    Sig: Take 1 tablet (5 mg total) by mouth every 8 (eight) hours as needed for moderate pain or severe pain.    Dispense:  90 tablet    Refill:  0    Do not place this medication, or any other prescription from our practice, on "Automatic Refill". Patient may have prescription filled one day early if pharmacy is closed on scheduled refill date. Do not fill until: 03/17/16 To last until: 04/16/16    [facility] & Procedure Ordered: Orders  Placed This Encounter  Procedures  . ToxASSURE Select 13 (MW), Urine    Imaging Ordered: None  Interventional Therapies: Scheduled:  None at this time.    Considering:  Lumbar facet radiofrequency    PRN Procedures:  None at this time.    Referral(s) or Consult(s): None at this time.  New Prescriptions   No medications on file    Medications administered during this visit: Mr. Faciane had no medications administered during this visit.  Requested PM Follow-up: Return in about 3 months (around 04/08/2016) for Medication Management, (3-Mo).  Future Appointments Date Time Provider [place]  01/09/2016 10:00 AM [facility]  01/17/2016 10:00 AM [facility] NL VASC 2 [facility]  02/04/2016 12:30 PM Rexene Alberts, MD [facility]  02/27/2016 11:00 AM Jerene Bears, MD [facility]  04/02/2016 10:40 AM Milinda Pointer, MD [facility] None  05/08/2016 10:00 AM [name], MD [facility]    Primary Care Physician: Selinda Orion Location: [facility] Note by: Kathlen Brunswick Dossie Arbour, M.D, DABA, DABAPM, DABPM, DABIPP, FIPP  Pain Score Disclaimer: We use the NRS-11 scale. This is a self-reported, subjective measurement of pain severity with only modest accuracy. It is used primarily to identify changes within a particular patient. It must be understood that outpatient pain scales are significantly less accurate that those used for research, where they can be applied under ideal controlled circumstances with minimal exposure to variables. In reality, the score is likely to be a combination of pain intensity and pain affect, where pain affect describes the degree of emotional arousal or changes in action readiness caused by the sensory experience  of pain. Factors such as social and work situation, setting, emotional state, anxiety levels, expectation, and prior pain experience may influence pain  perception and show large inter-individual differences that may also be affected by time variables.  Patient instructions provided during this appointment: Patient Instructions  You were given 3 prescriptions for Oxycodone and one for Robaxin. Radiofrequency Lesioning Radiofrequency lesioning is a procedure that is performed to relieve pain. The procedure is often used for back, neck, or arm pain. Radiofrequency lesioning involves the use of a machine that creates radio waves to make heat. During the procedure, the heat is applied to the nerve that carries the pain signal. The heat damages the nerve and interferes with the pain signal. Pain relief usually lasts for 6 months to 1 year. LET [facility] CARE PROVIDER KNOW ABOUT:  Any allergies you have.  All medicines you are taking, including vitamins, herbs, eye drops, creams, and over-the-counter medicines.  Previous problems you or members of your family have had with the use of anesthetics.  Any blood disorders you have.  Previous surgeries you have had.  Any medical conditions you have.  Whether you are pregnant or may be pregnant. RISKS AND COMPLICATIONS Generally, this is a safe procedure. However, problems may occur, including:  Pain or soreness at the injection site.  Infection at the injection site.  Damage to nerves or blood vessels. BEFORE THE PROCEDURE  Ask your health care provider about:  Changing or stopping your regular medicines. This is especially important if you are taking diabetes medicines or blood thinners.  Taking medicines such as aspirin and ibuprofen. These medicines can thin your blood. Do not take these medicines before your procedure if your health care provider instructs you not to.  Follow instructions from your health care provider about eating or drinking restrictions.  Plan to have someone take you home after the procedure.  If you go home right after the procedure, plan to have someone with  you for 24 hours. PROCEDURE  You will be given one or more of the following:  A medicine to help you relax (sedative).  A medicine to numb the area (local anesthetic).  You will be awake during the procedure. You will need to be able to talk with the health care provider during the procedure.  With the help of a type of X-ray (fluoroscopy), the health care provider will insert a radiofrequency needle into the area to be treated.  Next, a wire that carries the radio waves (electrode) will be put through the radiofrequency needle. An electrical pulse will be sent through the electrode to verify the correct nerve. You will feel a tingling sensation, and you may have muscle twitching.  Then, the tissue that is around the needle tip will be heated by an electric current that is passed using the radiofrequency machine. This will numb the nerves.  A bandage (dressing) will be put on the insertion area after the procedure is done. The procedure may vary among health care providers and hospitals. AFTER THE PROCEDURE  Your blood pressure, heart rate, breathing rate, and blood oxygen level will be monitored often until the medicines you were given have worn off.  Return to your normal activities as directed by your health care provider.   This information is not intended to replace advice given to you by your health care provider. Make sure you discuss any questions you have with your health care provider.   Document Released: 04/16/2011 Document Revised: 05/09/2015 Document Reviewed:  09/25/2014 Elsevier Interactive Patient Education [employer].

## 2016-01-07 NOTE — Progress Notes (Signed)
Safety precautions to be maintained throughout the outpatient stay will include: orient to surroundings, keep bed in low position, maintain call bell within reach at all times, provide assistance with transfer out of bed and ambulation. Pill count is 32/90 of oxycodone 5 mg tablets filled  On 12/19/2015.robxin is 88.5/90  tablets 500 mg filled on 01/06/2016.

## 2016-01-07 NOTE — Patient Instructions (Signed)
You were given 3 prescriptions for Oxycodone and one for Robaxin. Radiofrequency Lesioning Radiofrequency lesioning is a procedure that is performed to relieve pain. The procedure is often used for back, neck, or arm pain. Radiofrequency lesioning involves the use of a machine that creates radio waves to make heat. During the procedure, the heat is applied to the nerve that carries the pain signal. The heat damages the nerve and interferes with the pain signal. Pain relief usually lasts for 6 months to 1 year. LET [facility] CARE PROVIDER KNOW ABOUT:  Any allergies you have.  All medicines you are taking, including vitamins, herbs, eye drops, creams, and over-the-counter medicines.  Previous problems you or members of your family have had with the use of anesthetics.  Any blood disorders you have.  Previous surgeries you have had.  Any medical conditions you have.  Whether you are pregnant or may be pregnant. RISKS AND COMPLICATIONS Generally, this is a safe procedure. However, problems may occur, including:  Pain or soreness at the injection site.  Infection at the injection site.  Damage to nerves or blood vessels. BEFORE THE PROCEDURE  Ask your health care provider about:  Changing or stopping your regular medicines. This is especially important if you are taking diabetes medicines or blood thinners.  Taking medicines such as aspirin and ibuprofen. These medicines can thin your blood. Do not take these medicines before your procedure if your health care provider instructs you not to.  Follow instructions from your health care provider about eating or drinking restrictions.  Plan to have someone take you home after the procedure.  If you go home right after the procedure, plan to have someone with you for 24 hours. PROCEDURE  You will be given one or more of the following:  A medicine to help you relax (sedative).  A medicine to numb the area (local anesthetic).  You  will be awake during the procedure. You will need to be able to talk with the health care provider during the procedure.  With the help of a type of X-ray (fluoroscopy), the health care provider will insert a radiofrequency needle into the area to be treated.  Next, a wire that carries the radio waves (electrode) will be put through the radiofrequency needle. An electrical pulse will be sent through the electrode to verify the correct nerve. You will feel a tingling sensation, and you may have muscle twitching.  Then, the tissue that is around the needle tip will be heated by an electric current that is passed using the radiofrequency machine. This will numb the nerves.  A bandage (dressing) will be put on the insertion area after the procedure is done. The procedure may vary among health care providers and hospitals. AFTER THE PROCEDURE  Your blood pressure, heart rate, breathing rate, and blood oxygen level will be monitored often until the medicines you were given have worn off.  Return to your normal activities as directed by your health care provider.   This information is not intended to replace advice given to you by your health care provider. Make sure you discuss any questions you have with your health care provider.   Document Released: 04/16/2011 Document Revised: 05/09/2015 Document Reviewed: 09/25/2014 Elsevier Interactive Patient Education [employer].

## 2016-01-08 ENCOUNTER — Encounter: Payer: Self-pay | Admitting: Pain Medicine

## 2016-01-12 LAB — TOXASSURE SELECT 13 (MW), URINE

## 2016-01-14 ENCOUNTER — Other Ambulatory Visit (INDEPENDENT_AMBULATORY_CARE_PROVIDER_SITE_OTHER): Payer: Medicare HMO | Admitting: *Deleted

## 2016-01-14 ENCOUNTER — Ambulatory Visit (INDEPENDENT_AMBULATORY_CARE_PROVIDER_SITE_OTHER): Payer: Medicare HMO | Admitting: *Deleted

## 2016-01-14 DIAGNOSIS — I481 Persistent atrial fibrillation: Secondary | ICD-10-CM | POA: Diagnosis not present

## 2016-01-14 DIAGNOSIS — Z952 Presence of prosthetic heart valve: Secondary | ICD-10-CM

## 2016-01-14 DIAGNOSIS — I482 Chronic atrial fibrillation, unspecified: Secondary | ICD-10-CM

## 2016-01-14 DIAGNOSIS — Z5181 Encounter for therapeutic drug level monitoring: Secondary | ICD-10-CM | POA: Diagnosis not present

## 2016-01-14 DIAGNOSIS — M791 Myalgia: Secondary | ICD-10-CM

## 2016-01-14 DIAGNOSIS — I4819 Other persistent atrial fibrillation: Secondary | ICD-10-CM

## 2016-01-14 DIAGNOSIS — Z954 Presence of other heart-valve replacement: Secondary | ICD-10-CM | POA: Diagnosis not present

## 2016-01-14 DIAGNOSIS — M7918 Myalgia, other site: Secondary | ICD-10-CM

## 2016-01-14 LAB — POCT INR: INR: 2.6

## 2016-01-14 NOTE — Addendum Note (Signed)
Addended by: Eulis Foster on: 01/14/2016 01:33 PM   Modules accepted: Orders

## 2016-01-14 NOTE — Addendum Note (Signed)
Addended by: Therisa Doyne on: 01/14/2016 04:55 PM   Modules accepted: Orders

## 2016-01-14 NOTE — Addendum Note (Signed)
Addended by: Eulis Foster on: 01/14/2016 01:19 PM   Modules accepted: Orders

## 2016-01-15 LAB — SEDIMENTATION RATE: Sed Rate: 9 mm/hr (ref 0–20)

## 2016-01-17 ENCOUNTER — Inpatient Hospital Stay (HOSPITAL_COMMUNITY): Admission: RE | Admit: 2016-01-17 | Payer: Self-pay | Source: Ambulatory Visit

## 2016-01-17 ENCOUNTER — Ambulatory Visit (HOSPITAL_COMMUNITY)
Admission: RE | Admit: 2016-01-17 | Discharge: 2016-01-17 | Disposition: A | Discharge status: Home / Self Care | Payer: Medicare HMO | Source: Ambulatory Visit | Attending: Cardiology | Admitting: Cardiology

## 2016-01-17 ENCOUNTER — Telehealth (HOSPITAL_COMMUNITY): Payer: Self-pay | Admitting: Radiology

## 2016-01-17 DIAGNOSIS — I10 Essential (primary) hypertension: Secondary | ICD-10-CM | POA: Diagnosis not present

## 2016-01-17 DIAGNOSIS — E669 Obesity, unspecified: Secondary | ICD-10-CM | POA: Insufficient documentation

## 2016-01-17 DIAGNOSIS — Z6841 Body Mass Index (BMI) 40.0 and over, adult: Secondary | ICD-10-CM | POA: Insufficient documentation

## 2016-01-17 DIAGNOSIS — I071 Rheumatic tricuspid insufficiency: Secondary | ICD-10-CM | POA: Insufficient documentation

## 2016-01-17 DIAGNOSIS — Z952 Presence of prosthetic heart valve: Secondary | ICD-10-CM | POA: Diagnosis not present

## 2016-01-17 DIAGNOSIS — E785 Hyperlipidemia, unspecified: Secondary | ICD-10-CM | POA: Insufficient documentation

## 2016-01-17 DIAGNOSIS — I2510 Atherosclerotic heart disease of native coronary artery without angina pectoris: Secondary | ICD-10-CM | POA: Diagnosis not present

## 2016-01-17 DIAGNOSIS — I38 Endocarditis, valve unspecified: Secondary | ICD-10-CM | POA: Diagnosis not present

## 2016-01-17 NOTE — Telephone Encounter (Signed)
Patient called to verify appointment time. Patient was originally told 10 am. He arrived for his appointment and was told his appointment was for 3pm. He was told if he showed at 3:10 or 3:20 pm, he would have to reschedule. I verified patient's appointment is at 330 pm and he could arrive at 3:10 or 3:20 pm.

## 2016-01-25 ENCOUNTER — Ambulatory Visit (INDEPENDENT_AMBULATORY_CARE_PROVIDER_SITE_OTHER): Payer: Medicare HMO | Admitting: Pharmacist

## 2016-01-25 DIAGNOSIS — Z5181 Encounter for therapeutic drug level monitoring: Secondary | ICD-10-CM

## 2016-01-25 DIAGNOSIS — I481 Persistent atrial fibrillation: Secondary | ICD-10-CM | POA: Diagnosis not present

## 2016-01-25 DIAGNOSIS — Z954 Presence of other heart-valve replacement: Secondary | ICD-10-CM

## 2016-01-25 DIAGNOSIS — I482 Chronic atrial fibrillation, unspecified: Secondary | ICD-10-CM

## 2016-01-25 DIAGNOSIS — I4819 Other persistent atrial fibrillation: Secondary | ICD-10-CM

## 2016-01-25 DIAGNOSIS — Z952 Presence of prosthetic heart valve: Secondary | ICD-10-CM

## 2016-01-25 LAB — POCT INR: INR: 3.5

## 2016-01-29 ENCOUNTER — Other Ambulatory Visit: Payer: Self-pay | Admitting: Cardiology

## 2016-01-30 ENCOUNTER — Telehealth: Payer: Self-pay | Admitting: Cardiology

## 2016-01-30 NOTE — Telephone Encounter (Signed)
Patient states was given new rx for bactrim.  However he has not received it yet, as all of his prescriptions come thru mail order.  Advised that he call [facility] Coumadin clinic as soon as med arrives, to determine warfarin dose changes and repeat INR.  Patient voiced understanding.

## 2016-01-30 NOTE — Telephone Encounter (Signed)
Follow-up    Returning the nurses call no other information provided.

## 2016-01-30 NOTE — Telephone Encounter (Signed)
New message     Orie Fisherman PA saw the pt lastnight for bil. Leg for cellulitis gave a dose of Rocephin and on Bactrim   Pt C/o medication issue:  1. Name of Medication: Rocephin and Bactrim   2. How are you currently taking this medication (dosage and times per day)? 1 gram and bactrim for 10 days  3. Are you having a reaction (difficulty breathing--STAT)? No  4. What is your medication issue? The Pa gave the pt the medications wants to make sure cause it might interfere with the PT/INR     On the blood HGB-8.9, the PA wanted the cardiologist know the recommendation are. The PA had the pt to get some stool cards so the pt could check for blood in their stool, the office wants to know about the recommendation on the HGB.

## 2016-01-30 NOTE — Telephone Encounter (Signed)
Rx request sent to pharmacy.  

## 2016-02-01 ENCOUNTER — Other Ambulatory Visit: Payer: Self-pay | Admitting: Cardiology

## 2016-02-01 MED ORDER — WARFARIN SODIUM 5 MG PO TABS: 5.0000 mg | ORAL_TABLET | Freq: Every day | ORAL | Status: DC

## 2016-02-04 ENCOUNTER — Ambulatory Visit: Payer: Medicare HMO | Admitting: Thoracic Surgery (Cardiothoracic Vascular Surgery)

## 2016-02-05 ENCOUNTER — Encounter (HOSPITAL_COMMUNITY): Payer: Self-pay | Admitting: *Deleted

## 2016-02-05 ENCOUNTER — Emergency Department (HOSPITAL_COMMUNITY): Payer: Medicare HMO

## 2016-02-05 ENCOUNTER — Emergency Department (HOSPITAL_COMMUNITY)
Admission: EM | Admit: 2016-02-05 | Discharge: 2016-02-05 | Disposition: A | Discharge status: Home / Self Care | Payer: Medicare HMO | Attending: Emergency Medicine | Admitting: Emergency Medicine

## 2016-02-05 DIAGNOSIS — Z86010 Personal history of colonic polyps: Secondary | ICD-10-CM | POA: Diagnosis not present

## 2016-02-05 DIAGNOSIS — Z951 Presence of aortocoronary bypass graft: Secondary | ICD-10-CM | POA: Diagnosis not present

## 2016-02-05 DIAGNOSIS — Z872 Personal history of diseases of the skin and subcutaneous tissue: Secondary | ICD-10-CM | POA: Insufficient documentation

## 2016-02-05 DIAGNOSIS — Z79899 Other long term (current) drug therapy: Secondary | ICD-10-CM | POA: Diagnosis not present

## 2016-02-05 DIAGNOSIS — M79604 Pain in right leg: Secondary | ICD-10-CM | POA: Diagnosis present

## 2016-02-05 DIAGNOSIS — I481 Persistent atrial fibrillation: Secondary | ICD-10-CM | POA: Diagnosis not present

## 2016-02-05 DIAGNOSIS — M7989 Other specified soft tissue disorders: Secondary | ICD-10-CM

## 2016-02-05 DIAGNOSIS — Z8659 Personal history of other mental and behavioral disorders: Secondary | ICD-10-CM | POA: Diagnosis not present

## 2016-02-05 DIAGNOSIS — R600 Localized edema: Secondary | ICD-10-CM | POA: Diagnosis not present

## 2016-02-05 DIAGNOSIS — Z862 Personal history of diseases of the blood and blood-forming organs and certain disorders involving the immune mechanism: Secondary | ICD-10-CM | POA: Insufficient documentation

## 2016-02-05 DIAGNOSIS — M109 Gout, unspecified: Secondary | ICD-10-CM | POA: Insufficient documentation

## 2016-02-05 DIAGNOSIS — E119 Type 2 diabetes mellitus without complications: Secondary | ICD-10-CM | POA: Insufficient documentation

## 2016-02-05 DIAGNOSIS — I5032 Chronic diastolic (congestive) heart failure: Secondary | ICD-10-CM | POA: Insufficient documentation

## 2016-02-05 DIAGNOSIS — F17210 Nicotine dependence, cigarettes, uncomplicated: Secondary | ICD-10-CM | POA: Insufficient documentation

## 2016-02-05 DIAGNOSIS — Z8673 Personal history of transient ischemic attack (TIA), and cerebral infarction without residual deficits: Secondary | ICD-10-CM | POA: Insufficient documentation

## 2016-02-05 DIAGNOSIS — Z880 Allergy status to penicillin: Secondary | ICD-10-CM | POA: Diagnosis not present

## 2016-02-05 DIAGNOSIS — G4733 Obstructive sleep apnea (adult) (pediatric): Secondary | ICD-10-CM | POA: Insufficient documentation

## 2016-02-05 DIAGNOSIS — Z8547 Personal history of malignant neoplasm of testis: Secondary | ICD-10-CM | POA: Insufficient documentation

## 2016-02-05 DIAGNOSIS — Z8719 Personal history of other diseases of the digestive system: Secondary | ICD-10-CM | POA: Insufficient documentation

## 2016-02-05 DIAGNOSIS — R609 Edema, unspecified: Secondary | ICD-10-CM

## 2016-02-05 DIAGNOSIS — Z87438 Personal history of other diseases of male genital organs: Secondary | ICD-10-CM | POA: Diagnosis not present

## 2016-02-05 DIAGNOSIS — M1990 Unspecified osteoarthritis, unspecified site: Secondary | ICD-10-CM | POA: Insufficient documentation

## 2016-02-05 DIAGNOSIS — M79605 Pain in left leg: Secondary | ICD-10-CM | POA: Insufficient documentation

## 2016-02-05 DIAGNOSIS — I2510 Atherosclerotic heart disease of native coronary artery without angina pectoris: Secondary | ICD-10-CM | POA: Insufficient documentation

## 2016-02-05 DIAGNOSIS — Z7901 Long term (current) use of anticoagulants: Secondary | ICD-10-CM | POA: Diagnosis not present

## 2016-02-05 DIAGNOSIS — I252 Old myocardial infarction: Secondary | ICD-10-CM | POA: Diagnosis not present

## 2016-02-05 DIAGNOSIS — I10 Essential (primary) hypertension: Secondary | ICD-10-CM | POA: Insufficient documentation

## 2016-02-05 LAB — URINALYSIS, ROUTINE W REFLEX MICROSCOPIC
Bilirubin Urine: NEGATIVE
Glucose, UA: NEGATIVE mg/dL
Hgb urine dipstick: NEGATIVE
Ketones, ur: NEGATIVE mg/dL
LEUKOCYTES UA: NEGATIVE
NITRITE: NEGATIVE
PH: 6 (ref 5.0–8.0)
Protein, ur: NEGATIVE mg/dL
SPECIFIC GRAVITY, URINE: 1.018 (ref 1.005–1.030)

## 2016-02-05 LAB — PROTIME-INR
INR: 4.21 — AB (ref 0.00–1.49)
Prothrombin Time: 39.5 seconds — ABNORMAL HIGH (ref 11.6–15.2)

## 2016-02-05 LAB — CBC WITH DIFFERENTIAL/PLATELET
BASOS ABS: 0 10*3/uL (ref 0.0–0.1)
Basophils Relative: 0 %
Eosinophils Absolute: 0.2 10*3/uL (ref 0.0–0.7)
Eosinophils Relative: 2 %
HEMATOCRIT: 29.7 % — AB (ref 39.0–52.0)
Hemoglobin: 8.4 g/dL — ABNORMAL LOW (ref 13.0–17.0)
LYMPHS ABS: 0.5 10*3/uL — AB (ref 0.7–4.0)
Lymphocytes Relative: 7 %
MCH: 19.3 pg — ABNORMAL LOW (ref 26.0–34.0)
MCHC: 28.3 g/dL — AB (ref 30.0–36.0)
MCV: 68.3 fL — AB (ref 78.0–100.0)
MONO ABS: 0.6 10*3/uL (ref 0.1–1.0)
MONOS PCT: 8 %
NEUTROS ABS: 6.3 10*3/uL (ref 1.7–7.7)
Neutrophils Relative %: 83 %
Platelets: 185 10*3/uL (ref 150–400)
RBC: 4.35 MIL/uL (ref 4.22–5.81)
RDW: 18.8 % — AB (ref 11.5–15.5)
WBC: 7.6 10*3/uL (ref 4.0–10.5)

## 2016-02-05 LAB — I-STAT BETA HCG BLOOD, ED (MC, WL, AP ONLY)

## 2016-02-05 LAB — COMPREHENSIVE METABOLIC PANEL
ALK PHOS: 55 U/L (ref 38–126)
ALT: 19 U/L (ref 17–63)
ANION GAP: 6 (ref 5–15)
AST: 21 U/L (ref 15–41)
Albumin: 2.6 g/dL — ABNORMAL LOW (ref 3.5–5.0)
BILIRUBIN TOTAL: 0.7 mg/dL (ref 0.3–1.2)
BUN: 21 mg/dL — ABNORMAL HIGH (ref 6–20)
CALCIUM: 8.3 mg/dL — AB (ref 8.9–10.3)
CO2: 29 mmol/L (ref 22–32)
Chloride: 102 mmol/L (ref 101–111)
Creatinine, Ser: 1.22 mg/dL (ref 0.61–1.24)
GLUCOSE: 104 mg/dL — AB (ref 65–99)
POTASSIUM: 4.3 mmol/L (ref 3.5–5.1)
Sodium: 137 mmol/L (ref 135–145)
TOTAL PROTEIN: 5 g/dL — AB (ref 6.5–8.1)

## 2016-02-05 LAB — I-STAT CG4 LACTIC ACID, ED: LACTIC ACID, VENOUS: 1.59 mmol/L (ref 0.5–2.0)

## 2016-02-05 MED ORDER — CEPHALEXIN 500 MG PO CAPS: 500.0000 mg | ORAL_CAPSULE | Freq: Three times a day (TID) | ORAL | Status: DC

## 2016-02-05 MED ORDER — HYDROXYZINE HCL 25 MG PO TABS: 25.0000 mg | ORAL_TABLET | Freq: Four times a day (QID) | ORAL | Status: DC | PRN

## 2016-02-05 NOTE — ED Notes (Addendum)
Patient complains of leg swelling, redness, pain x 1 week. Patient concerned that he has infection, states 2 artificial heart valves and concerned that he may be septic. Alert and oriented on arrival.  No acute distress. Saw MD last week and taking bactrim for legs

## 2016-02-05 NOTE — Discharge Instructions (Signed)
Your INR today was 4.2. Please hold two doses of your Coumadin and follow up with the coumadin clinic. Please call your primary care provider to schedule a follow up appointment as soon as possible.

## 2016-02-05 NOTE — ED Provider Notes (Signed)
CSN: [id number]     Arrival date & time 02/05/16  1736 History   First MD Initiated Contact with Patient 02/05/16 2115     Chief Complaint  Patient presents with  . Leg Swelling  . Leg Pain    HPI   Lance Cook is an 55 y.o. male with history of chronic lymphaedma, venous insufficiency, afib, CAD, HTN, HLD, DM, chronic wounds who presents to the ED for evaluation of lower extremity swelling and wounds. He states he has chronic venous stasis issues with weeping lymphedema but he is here in the ED as he is worried he is becoming septic from cellulitis. He states he was started on PO bactrim by his PCP which he has been taking as prescribed but his wounds still feel hot to the touch to him and he has noticed more open areas compared to the past. He states he has been seen by wound care, vascular, and now is mostly followed by primary care for management of his chronic leg issues. He is unsure if he has been febrile at home. Denies other symptoms. Denies chills, nausea, vomiting, chest pain, SOB. He is also requesting check of his INR.  Past Medical History  Diagnosis Date  . Hyperlipidemia   . Hypertension   . Arthritis   . Leg pain 06/28/2010  . Hiatal hernia   . Atrial fibrillation (HCC)     chronic persistent  . Reflux 11/06/09  . Myocardial infarction [facility]) age 7  . Varicose veins   . Obesity   . Peripheral vascular disease ([place])   . Gout   . CAD (coronary artery disease)     Old scar inferior wall myoview, 10/2009 EF 52%  . Chronic LBP 10/08/2011  . Impaired glucose tolerance 10/08/2011  . Erectile dysfunction 10/08/2011  . Testicular cancer ([place])   . Bell's palsy   . TIA (transient ischemic attack)     age 26  . CVA (cerebral infarction)     55yo  . OSA (obstructive sleep apnea)     CPAP  . History of colon polyps   . Anemia   . Hypogonadism male 04/08/2012  . Aortic aneurysm ([place])   . Aortic aneurysm and dissection ([place])   . 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 Ascending aortic dissection ([place]) 07/14/2008    Localized dissection of ascending aorta noted on CTA in 2009 and stable on CTA in 2011  . Severe mitral regurgitation 10/11/2014  . Morbid obesity ([place]) 04/02/2014  . Diabetes mellitus     diet controlled  . DIABETES MELLITUS, TYPE II 11/01/2009    Qualifier: Diagnosis of  By: [name] MD, [name]    . Dysrhythmia   . CHF (congestive heart failure) ([place])   . Shortness of breath dyspnea     with exertion  . Anxiety   . GERD (gastroesophageal reflux disease)     not needing medication at thhis time- 01/22/15  . Stroke [facility])     TIA and Stroke no residual effect- 1st was 47  . Chronic diastolic congestive heart failure ([place])   . 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 ARDS (adult respiratory distress syndrome) ([place]) 01/27/2015  . Acute respiratory failure with hypoxemia ([place])   . Asymptomatic  chronic venous hypertension 01/15/2010    Overview:  Overview:  Qualifier: Diagnosis of  By: [name] MD, [name]   Last Assessment & Plan:  I suggested he go to a vein clinic to see if he could be qualified compression hose of some type, since he is not a surgical candidate according to the vascular surgeons.   . Cellulitis 04/02/2014  . Cerebral artery occlusion with cerebral infarction ([place]) 10/08/2011    Overview:  Overview:  And hx of TIA prior to CABG, all thought due to systemic emboli prior to coumadin   . Chronic diastolic heart failure ([place]) 03/01/2015  . CVA (cerebral vascular accident) ([place]) 10/08/2011    And hx of TIA prior to CABG, all thought due to systemic emboli prior to coumadin   . Encephalopathy acute 02/05/2015  . ED (erectile dysfunction) of organic origin 10/08/2011    Overview:  Last Assessment & Plan:  S/p unilateral  orchiectomy, for testosterone level, consider androgel pump 1.62    . History of adenomatous polyp of colon 12/23/2011    Colonoscopy January 2010, 6 mm rectal tubulovillous adenoma. No high-grade dysplasia   . History of colon polyps 12/23/2011    Overview:  Overview:  Colonoscopy January 2010, 6 mm rectal tubulovillous adenoma. No high-grade dysplasia   . H/O mechanical aortic valve replacement 11/01/2009    Qualifier: Diagnosis of  By: [name], [name]     Past Surgical History  Procedure Laterality Date  . Laser ablation  03/06/2010    left leg  . Cardiac valve replacement      [id number]  . Cholecystectomy  2011  . Tonsillectomy  1967  . Otoplasty      bilateral, age 33  . Orchiectomy  age 76    testicular cancer  . Tee without cardioversion N/A 10/11/2014    Procedure: TRANSESOPHAGEAL ECHOCARDIOGRAM (TEE);  Surgeon: Pixie Casino, MD;  Location: [facility] ENDOSCOPY;  Service: Cardiovascular;  Laterality: N/ADeneen Harts procedure  1988    25 mm St Jude mechanical valve conduit - Dr Blase Mess at [facility] in [place], [place]  . Coronary artery bypass graft    . Aortic truck    . Tee without cardioversion N/A 01/24/2015    Procedure: TRANSESOPHAGEAL ECHOCARDIOGRAM (TEE);  Surgeon: Rexene Alberts, MD;  Location: [place];  Service: Open Heart Surgery;  Laterality: N/A;  . Mitral valve replacement Right 01/24/2015    Procedure: Re-Operation, MINIMALLY INVASIVE MITRAL VALVE (MV) REPLACEMENT.;  Surgeon: Rexene Alberts, MD;  Location: [place];  Service: Open Heart Surgery;  Laterality: Right;   Family History  Problem Relation Age of Onset  . Heart disease Mother   . Throat cancer Mother   . Diabetes Father   . Stroke Other   . Hypertension Other   . Cancer Maternal Aunt     lung, brain  . Cancer Maternal Uncle     brain aneurysm   Social History  Substance Use Topics  . Smoking status: Light Tobacco Smoker    Types: Cigars  . Smokeless tobacco: Never Used     Comment: about 3  yearly- cigar  . Alcohol Use: 0.0 oz/week    0 Standard drinks or equivalent per week     Comment: social    Review of Systems  All other systems reviewed and are negative.     Allergies  Penicillins and Adhesive  Home Medications   Prior to Admission medications   Medication Sig Start Date End Date Taking? Authorizing  Provider  allopurinol (ZYLOPRIM) 300 MG tablet Take 300 mg by mouth daily. 11/17/12   Biagio Borg, MD  atenolol (TENORMIN) 100 MG tablet Take 0.5 tablets (50 mg total) by mouth 2 (two) times daily. 02/15/15   Bary Leriche, PA-C  b complex vitamins tablet Take 1 tablet by mouth daily.      Historical Provider, MD  bacitracin 500 UNIT/GM ointment Apply topically as needed.    Historical Provider, MD  Calcium-Magnesium-Zinc (CAL-MAG-ZINC PO) Take by mouth daily.      Historical Provider, MD  clindamycin (CLEOCIN) 300 MG capsule 300 mg as needed (for dental appointments).  01/04/16   Historical Provider, MD  digoxin (LANOXIN) 0.25 MG tablet TAKE 1 TABLET EVERY DAY 01/30/16   [name], MD  furosemide (LASIX) 40 MG tablet Take 40 mg daily and may take extra 40 mg if has sob or edema 06/04/15   [name], MD  magnesium oxide (MAG-OX) 400 (241.3 MG) MG tablet Take 1 tablet (400 mg total) by mouth 2 (two) times daily. 06/04/15   [name], MD  methocarbamol (ROBAXIN) 500 MG tablet Take 1 tablet (500 mg total) by mouth 3 (three) times daily. 01/07/16   Milinda Pointer, MD  ONE TOUCH ULTRA TEST test strip  09/04/14   Historical Provider, MD  oxyCODONE (OXY IR/ROXICODONE) 5 MG immediate release tablet Take 1 tablet (5 mg total) by mouth every 8 (eight) hours as needed for moderate pain or severe pain. 01/07/16   Milinda Pointer, MD  oxyCODONE (OXY IR/ROXICODONE) 5 MG immediate release tablet Take 1 tablet (5 mg total) by mouth every 8 (eight) hours as needed for moderate pain or severe pain. 01/07/16   Milinda Pointer, MD  oxyCODONE (OXY IR/ROXICODONE) 5 MG immediate  release tablet Take 1 tablet (5 mg total) by mouth every 8 (eight) hours as needed for moderate pain or severe pain. 01/07/16   Milinda Pointer, MD  potassium chloride (K-DUR) 10 MEQ tablet Take 2 tablets (20 mEq total) by mouth 2 (two) times daily. 02/15/15   Bary Leriche, PA-C  silver sulfADIAZINE (SILVADENE) 1 % cream Apply topically as needed.    Historical Provider, MD  temazepam (RESTORIL) 15 MG capsule Take 15 mg by mouth at bedtime as needed for sleep.    Historical Provider, MD  warfarin (COUMADIN) 5 MG tablet Take 1 tablet (5 mg total) by mouth daily at 6 PM. 02/01/16   Brittainy M Simmons, PA-C   BP 132/75 mmHg  Pulse 72  Temp(Src) 98.2 F (36.8 C) (Oral)  Resp 18  Ht 5' (1.524 m)  Wt 96.616 kg  BMI 41.60 kg/m2  SpO2 100% Physical Exam  Constitutional: He is oriented to person, place, and time.  HENT:  Right Ear: External ear normal.  Left Ear: External ear normal.  Nose: Nose normal.  Mouth/Throat: Oropharynx is clear and moist. No oropharyngeal exudate.  Eyes: Conjunctivae and EOM are normal. Pupils are equal, round, and reactive to light.  Neck: Normal range of motion. Neck supple.  Cardiovascular: Normal rate, regular rhythm, normal heart sounds and intact distal pulses.   Pulmonary/Chest: Effort normal and breath sounds normal. No respiratory distress. He has no wheezes. He exhibits no tenderness.  Abdominal: Soft. Bowel sounds are normal. He exhibits no distension. There is no tenderness. There is no rebound and no guarding.  Musculoskeletal: He exhibits no edema.  2+ pitting edema bilateral LE. Chronic venous stasis changes. Bilateral legs with multiple areas weeping lymphatic drainage. No purulence. Areas  are not hot to touch. No erythema spreading up leg.  Neurological: He is alert and oriented to person, place, and time. No cranial nerve deficit.  Skin: Skin is warm and dry.  Psychiatric: He has a normal mood and affect.  Nursing note and vitals reviewed.   ED  Course  Procedures (including critical care time) Labs Review Labs Reviewed  COMPREHENSIVE METABOLIC PANEL - Abnormal; Notable for the following:    Glucose, Bld 104 (*)    BUN 21 (*)    Calcium 8.3 (*)    Total Protein 5.0 (*)    Albumin 2.6 (*)    All other components within normal limits  CBC WITH DIFFERENTIAL/PLATELET - Abnormal; Notable for the following:    Hemoglobin 8.4 (*)    HCT 29.7 (*)    MCV 68.3 (*)    MCH 19.3 (*)    MCHC 28.3 (*)    RDW 18.8 (*)    Lymphs Abs 0.5 (*)    All other components within normal limits  URINALYSIS, ROUTINE W REFLEX MICROSCOPIC (NOT AT [facility])  I-STAT BETA HCG BLOOD, ED ([facility], [facility], [facility] ONLY)  I-STAT CG4 LACTIC ACID, ED  I-STAT CG4 LACTIC ACID, ED    Imaging Review Dg Chest 2 View  02/05/2016  CLINICAL DATA:  Sepsis.  Low-grade fever. EXAM: CHEST  2 VIEW COMPARISON:  04/30/2015 FINDINGS: Median sternotomy changes and mitral valve prostheses are stable. The cardiac silhouette is significantly enlarged, which has progressed when compared to the prior study. Mediastinal contours appear intact. There is no evidence of focal airspace consolidation, pleural effusion or pneumothorax. There is pulmonary vascular congestion. Osseous structures are without acute abnormality. Soft tissues are grossly normal. IMPRESSION: Enlarged cardiac silhouette, which has progressed when compared to the prior study. Pulmonary vascular congestion. Electronically Signed   By: Fidela Salisbury M.D.   On: 02/05/2016 18:48   I have personally reviewed and evaluated these images and lab results as part of my medical decision-making.   EKG Interpretation None      MDM   Final diagnoses:  Leg swelling  Peripheral edema  Leg pain, bilateral    Pt is an 55 y.o. male with chronic leg wounds here for evaluation of possible worsening infection. His wounds appear chronic. There is no spreading erythema suggesting new cellulitis. No purulent drainage. His legs are not hot to  the touch. He is afebrile and nontoxic appearing. He has no elevated white count or lactic acidosis. I discussed with pt suspect his wounds are chronic, maybe progressing but no acute concomittant infection. He is requesting treatment with antibiotics to prevent things from getting worse. Will give course of keflex but instructed to take only if he notices redness or purulent drainage, or if pain increasing. rx for vistaril given for itching as pt states his home benadryl makes him too drowsy. Instructed close PCP follow up. ER return precautions given.    Anne Ng, PA-C 02/06/16 [id number]  Sherwood Gambler, MD 02/11/16 0730

## 2016-02-06 ENCOUNTER — Ambulatory Visit (INDEPENDENT_AMBULATORY_CARE_PROVIDER_SITE_OTHER): Payer: Medicare HMO | Admitting: Pharmacist

## 2016-02-06 DIAGNOSIS — I482 Chronic atrial fibrillation, unspecified: Secondary | ICD-10-CM

## 2016-02-06 DIAGNOSIS — Z5181 Encounter for therapeutic drug level monitoring: Secondary | ICD-10-CM

## 2016-02-15 ENCOUNTER — Ambulatory Visit (INDEPENDENT_AMBULATORY_CARE_PROVIDER_SITE_OTHER): Payer: Medicare HMO | Admitting: Pharmacist

## 2016-02-15 ENCOUNTER — Encounter: Payer: Medicare HMO | Attending: Surgery | Admitting: Surgery

## 2016-02-15 DIAGNOSIS — Z952 Presence of prosthetic heart valve: Secondary | ICD-10-CM

## 2016-02-15 DIAGNOSIS — I890 Lymphedema, not elsewhere classified: Secondary | ICD-10-CM | POA: Diagnosis not present

## 2016-02-15 DIAGNOSIS — Z7901 Long term (current) use of anticoagulants: Secondary | ICD-10-CM | POA: Diagnosis not present

## 2016-02-15 DIAGNOSIS — L97211 Non-pressure chronic ulcer of right calf limited to breakdown of skin: Secondary | ICD-10-CM | POA: Diagnosis not present

## 2016-02-15 DIAGNOSIS — Z5181 Encounter for therapeutic drug level monitoring: Secondary | ICD-10-CM | POA: Diagnosis not present

## 2016-02-15 DIAGNOSIS — Z954 Presence of other heart-valve replacement: Secondary | ICD-10-CM | POA: Diagnosis not present

## 2016-02-15 DIAGNOSIS — G4733 Obstructive sleep apnea (adult) (pediatric): Secondary | ICD-10-CM | POA: Diagnosis not present

## 2016-02-15 DIAGNOSIS — I481 Persistent atrial fibrillation: Secondary | ICD-10-CM

## 2016-02-15 DIAGNOSIS — I110 Hypertensive heart disease with heart failure: Secondary | ICD-10-CM | POA: Diagnosis not present

## 2016-02-15 DIAGNOSIS — I482 Chronic atrial fibrillation, unspecified: Secondary | ICD-10-CM

## 2016-02-15 DIAGNOSIS — I509 Heart failure, unspecified: Secondary | ICD-10-CM | POA: Insufficient documentation

## 2016-02-15 DIAGNOSIS — I87312 Chronic venous hypertension (idiopathic) with ulcer of left lower extremity: Secondary | ICD-10-CM | POA: Insufficient documentation

## 2016-02-15 DIAGNOSIS — Z79899 Other long term (current) drug therapy: Secondary | ICD-10-CM | POA: Diagnosis not present

## 2016-02-15 DIAGNOSIS — Z6841 Body Mass Index (BMI) 40.0 and over, adult: Secondary | ICD-10-CM | POA: Diagnosis not present

## 2016-02-15 DIAGNOSIS — I4819 Other persistent atrial fibrillation: Secondary | ICD-10-CM

## 2016-02-15 DIAGNOSIS — I252 Old myocardial infarction: Secondary | ICD-10-CM | POA: Insufficient documentation

## 2016-02-15 DIAGNOSIS — I87311 Chronic venous hypertension (idiopathic) with ulcer of right lower extremity: Secondary | ICD-10-CM | POA: Insufficient documentation

## 2016-02-15 DIAGNOSIS — L97221 Non-pressure chronic ulcer of left calf limited to breakdown of skin: Secondary | ICD-10-CM | POA: Insufficient documentation

## 2016-02-15 LAB — POCT INR: INR: 3.9

## 2016-02-15 NOTE — Progress Notes (Addendum)
TION, BERHE ([id number]) Visit Report for 02/15/2016 Chief Complaint Document Details Patient Name: Lance Cook, Lance Cook Date of Service: 02/15/2016 8:45 AM Medical Record Number: [id number] Patient Account Number: [id number] Date of Birth/Sex: 1961-07-23 (55 y.o. Male) Treating RN: Macarthur Critchley Primary Care Physician: Roe Coombs Other Clinician: Referring Physician: Roe Coombs Treating Physician/Extender: Frann Rider in Treatment: 0 Information Obtained from: Patient Chief Complaint Patient presents for treatment of an open ulcer due to venous insufficiency and lymphedema bilateral lower extremities which he's had for over 40 years Electronic Signature(s) Signed: 02/15/2016 10:08:56 AM By: Christin Fudge MD, FACS Entered By: Christin Fudge on 02/15/2016 10:08:56 Lance Cook ([id number]) -------------------------------------------------------------------------------- HPI Details Patient Name: Lance Cook Date of Service: 02/15/2016 8:45 AM Medical Record Number: [id number] Patient Account Number: [id number] Date of Birth/Sex: 08-13-1961 (55 y.o. Male) Treating RN: Macarthur Critchley Primary Care Physician: Roe Coombs Other Clinician: Referring Physician: Roe Coombs Treating Physician/Extender: Frann Rider in Treatment: 0 History of Present Illness Location: superficial ulcerations both lower extremities with massive edema Quality: Patient reports experiencing a dull pain to affected area(s). Severity: Patient states wound are getting worse. Duration: Patient has had the wound for > 40 years prior to seeking treatment at the wound center Timing: Pain in wound is constant (hurts all the time) Context: The wound appeared gradually over time Modifying Factors: Other treatment(s) tried include:he is had multiple sclerotherapy's, arterial and venous studies done at [facility] and also seeing Dr. Sherren Mocha Early Associated Signs and Symptoms: Patient reports having  difficulty standing for long periods. HPI Description: 55 year old gentleman who has recently presented to his PCP with bilateral leg cellulitis and has now been referred to [place] for an opinion. The patient is known to have multiple comorbidities which include coronary artery disease, lymphedema and chronic venous insufficiency, has been recently treated for cellulitis of both lower extremities. Patient is not a smoker and drinks alcohol occasionally. Past medical history significant for chronic lymphedema, venous insufficiency,essential hypertension, congestive heart failure, myocardial infarction, atrial fibrillation, chronic venous hypertension with ulcer, obstructive sleep apnea,testicular cancer, gout,long-term use of opiate advances 6, chronic back pain, peripheral edema, morbid obesity.he is on long-term anticoagulation with Coumadin past surgical history is significant for laser ablation of the left leg in 2011, aortic valve replacement in 1988, cholecystectomy, tonsillectomy, orchiectomy at age 65, CABG, mitral valve replacement in 2016. he was recently seen in the ER and put on Keflex as the patient was concerned about an infection. the patient has had extensive treatment over the last 40 years and has had signs of venous insufficiency with sclerotherapy being done on a regular basis at [facility]. He also has been treated at [place] and has seen vascular surgeons elsewhere. Patient is discussed a stage where he is noncompliant due to the frustration of not having results and is unable to use compression wraps, stockings, lymphedema pumps. Electronic Signature(s) Signed: 02/15/2016 10:11:38 AM By: Christin Fudge MD, FACS Previous Signature: 02/15/2016 8:49:53 AM Version By: Christin Fudge MD, FACS Entered By: Christin Fudge on 02/15/2016 10:11:37 Lance Cook, Lance Cook ([id number]) -------------------------------------------------------------------------------- Physical Exam  Details Patient Name: Lance Cook Date of Service: 02/15/2016 8:45 AM Medical Record Number: [id number] Patient Account Number: [id number] Date of Birth/Sex: December 25, 1960 (55 y.o. Male) Treating RN: Macarthur Critchley Primary Care Physician: Roe Coombs Other Clinician: Referring Physician: Roe Coombs Treating Physician/Extender: Frann Rider in Treatment: 0 Constitutional . Pulse regular. Respirations normal and unlabored. Afebrile. . Eyes Nonicteric. Reactive to light. Ears, Nose, Mouth, and Throat Lips, teeth, and  gums WNL.Marland Kitchen Moist mucosa without lesions. Neck supple and nontender. No palpable supraclavicular or cervical adenopathy. Normal sized without goiter. Respiratory WNL. No retractions.. Cardiovascular Pedal Pulses WNL. ABIs are noncompressible. No clubbing, cyanosis or edema. Genitourinary (GU) No hydrocele, spermatocele, tenderness of the cord, or testicular mass.Marland Kitchen Penis without lesions.Lance Cook without lesions. No cystocele, or rectocele. Pelvic support intact, no discharge.Marland Kitchen Urethra without masses, tenderness or scarring.Marland Kitchen Lymphatic No adneopathy. No adenopathy. No adenopathy. Musculoskeletal Adexa without tenderness or enlargement.. Digits and nails w/o clubbing, cyanosis, infection, petechiae, ischemia, or inflammatory conditions.. Integumentary (Hair, Skin) No suspicious lesions. No crepitus or fluctuance. No peri-wound warmth or erythema. No masses.Marland Kitchen Psychiatric Judgement and insight Intact.. No evidence of depression, anxiety, or agitation.. Notes he has massive lymphedema which is a stage II and has signs and symptoms of venous insufficiency with hemosiderin staining both lower extremities and multiple superficial ulcerations both lower legs. None of them required debridement. Electronic Signature(s) Signed: 02/15/2016 10:12:29 AM By: Christin Fudge MD, FACS Entered By: Christin Fudge on 02/15/2016 10:12:29 Lance Cook  ([id number]) -------------------------------------------------------------------------------- Physician Orders Details Patient Name: Lance Cook Date of Service: 02/15/2016 8:45 AM Medical Record Number: [id number] Patient Account Number: [id number] Date of Birth/Sex: Aug 03, 1961 (55 y.o. Male) Treating RN: Macarthur Critchley Primary Care Physician: Roe Coombs Other Clinician: Referring Physician: Roe Coombs Treating Physician/Extender: Frann Rider in Treatment: 0 Verbal / Phone Orders: Yes Clinician: Macarthur Critchley Read Back and Verified: Yes Diagnosis Coding Wound Cleansing Wound #1 Left Lower Leg o May shower with protection. - if you keep wrap on Wound #2 Right Lower Leg o May shower with protection. - if you keep wrap on Primary Wound Dressing Wound #1 Left Lower Leg o Aquacel Ag Wound #2 Right Lower Leg o Aquacel Ag Secondary Dressing Wound #1 Left Lower Leg o Dry Gauze Wound #2 Right Lower Leg o Dry Gauze Dressing Change Frequency Wound #1 Left Lower Leg o Change dressing every week Wound #2 Right Lower Leg o Change dressing every week Follow-up Appointments Wound #1 Left Lower Leg o Return Appointment in 1 week. Wound #2 Right Lower Leg o Return Appointment in 1 week. Edema Control ULA, YINGER ([id number]) Wound #1 Left Lower Leg o 2 Layer Lite Compression System - Bilateral o Elevate legs to the level of the heart and pump ankles as often as possible o Other: - lymphedema pumps twice a day Wound #2 Right Lower Leg o 2 Layer Lite Compression System - Bilateral o Elevate legs to the level of the heart and pump ankles as often as possible o Other: - lymphedema pumps twice a day Consults o Vascular - need work up for arterial studies Bilateral legs Notes seek second opinion for existing condition and treatment if patient choices to change course of plan: [place], [facility] Electronic  Signature(s) Signed: 02/15/2016 2:14:47 PM By: Rebecca Eaton RN, Roslynn Amble Signed: 02/15/2016 4:58:26 PM By: Christin Fudge MD, FACS Entered By: Rebecca Eaton RN, [place] on 02/15/2016 09:54:14 Lance Cook ([id number]) -------------------------------------------------------------------------------- Problem List Details Patient Name: Lance Cook Date of Service: 02/15/2016 8:45 AM Medical Record Number: [id number] Patient Account Number: [id number] Date of Birth/Sex: 1961/02/11 (55 y.o. Male) Treating RN: Macarthur Critchley Primary Care Physician: Roe Coombs Other Clinician: Referring Physician: Roe Coombs Treating Physician/Extender: Frann Rider in Treatment: 0 Active Problems ICD-10 Encounter Code Description Active Date Diagnosis L97.211 Non-pressure chronic ulcer of right calf limited to 02/15/2016 Yes breakdown of skin L97.221 Non-pressure chronic ulcer of left calf limited to 02/15/2016 Yes breakdown of  skin I87.311 Chronic venous hypertension (idiopathic) with ulcer of 02/15/2016 Yes right lower extremity I87.312 Chronic venous hypertension (idiopathic) with ulcer of left 02/15/2016 Yes lower extremity I89.0 Lymphedema, not elsewhere classified 02/15/2016 Yes Inactive Problems Resolved Problems Electronic Signature(s) Signed: 02/15/2016 10:08:27 AM By: Christin Fudge MD, FACS Entered By: Christin Fudge on 02/15/2016 10:08:26 Lance Cook ([id number]) -------------------------------------------------------------------------------- Progress Note Details Patient Name: Lance Cook Date of Service: 02/15/2016 8:45 AM Medical Record Number: [id number] Patient Account Number: [id number] Date of Birth/Sex: 04-17-1961 (55 y.o. Male) Treating RN: Macarthur Critchley Primary Care Physician: Roe Coombs Other Clinician: Referring Physician: Roe Coombs Treating Physician/Extender: Frann Rider in Treatment: 0 Subjective Chief Complaint Information obtained from Patient Patient  presents for treatment of an open ulcer due to venous insufficiency and lymphedema bilateral lower extremities which he's had for over 40 years History of Present Illness (HPI) The following HPI elements were documented for the patient's wound: Location: superficial ulcerations both lower extremities with massive edema Quality: Patient reports experiencing a dull pain to affected area(s). Severity: Patient states wound are getting worse. Duration: Patient has had the wound for > 40 years prior to seeking treatment at the wound center Timing: Pain in wound is constant (hurts all the time) Context: The wound appeared gradually over time Modifying Factors: Other treatment(s) tried include:he is had multiple sclerotherapy's, arterial and venous studies done at [facility] and also seeing Dr. Sherren Mocha Early Associated Signs and Symptoms: Patient reports having difficulty standing for long periods. 55 year old gentleman who has recently presented to his PCP with bilateral leg cellulitis and has now been referred to [place] for an opinion. The patient is known to have multiple comorbidities which include coronary artery disease, lymphedema and chronic venous insufficiency, has been recently treated for cellulitis of both lower extremities. Patient is not a smoker and drinks alcohol occasionally. Past medical history significant for chronic lymphedema, venous insufficiency,essential hypertension, congestive heart failure, myocardial infarction, atrial fibrillation, chronic venous hypertension with ulcer, obstructive sleep apnea,testicular cancer, gout,long-term use of opiate advances 6, chronic back pain, peripheral edema, morbid obesity.he is on long-term anticoagulation with Coumadin past surgical history is significant for laser ablation of the left leg in 2011, aortic valve replacement in 1988, cholecystectomy, tonsillectomy, orchiectomy at age 37, CABG, mitral valve replacement in 2016. he was  recently seen in the ER and put on Keflex as the patient was concerned about an infection. the patient has had extensive treatment over the last 40 years and has had signs of venous insufficiency with sclerotherapy being done on a regular basis at [facility]. He also has been treated at [place] and has seen vascular surgeons elsewhere. Patient is discussed a stage where he is noncompliant due to the frustration of not having results and is unable to use compression wraps, stockings, lymphedema pumps. Wound History Patient presents with 2 open wounds that have been present for approximately years. Patient has been EDON, Lance Cook ([id number]) treating wounds in the following manner: corticone cream. The wounds have been healed in the past but have re-opened. Laboratory tests have been performed in the last month. Patient reportedly has tested positive for an antibiotic resistant organism. Patient reportedly has not tested positive for osteomyelitis. Patient reportedly has had testing performed to evaluate circulation in the legs. Patient experiences the following problems associated with their wounds: swelling. Patient History Information obtained from Patient. Allergies pencillin Family History Cancer, Diabetes - Mother,, Heart Disease, Hypertension, No family history of Kidney Disease. Social History Light tobacco smoker -  few times a year, Marital Status - Married, Alcohol Use - Rarely, Caffeine Use - Rarely. Medical History Eyes Denies history of Cataracts, Glaucoma, Optic Neuritis Hematologic/Lymphatic Denies history of Anemia, Hemophilia, Human Immunodeficiency Virus, Lymphedema, Sickle Cell Disease Respiratory Patient has history of Sleep Apnea - c pap Denies history of Aspiration, Asthma, Chronic Obstructive Pulmonary Disease (COPD), Pneumothorax, Tuberculosis Cardiovascular Patient has history of Congestive Heart Failure, Coronary Artery Disease, Deep Vein  Thrombosis, Hypertension Denies history of Angina, Hypotension, Myocardial Infarction Gastrointestinal Denies history of Cirrhosis , Colitis, Crohn s, Hepatitis A, Hepatitis B, Hepatitis C Endocrine Denies history of Type I Diabetes, Type II Diabetes Genitourinary Denies history of End Stage Renal Disease Immunological Denies history of Lupus Erythematosus, Raynaud s, Scleroderma Integumentary (Skin) Denies history of History of Burn, History of pressure wounds Musculoskeletal Denies history of Gout, Rheumatoid Arthritis, Osteoarthritis, Osteomyelitis Neurologic Denies history of Dementia, Neuropathy, Quadriplegia, Paraplegia, Seizure Disorder Oncologic Patient has history of Received Radiation Lance Cook, Lance Cook ([id number]) Review of Systems (ROS) Constitutional Symptoms (General Health) The patient has no complaints or symptoms. Eyes The patient has no complaints or symptoms. Ear/Nose/Mouth/Throat The patient has no complaints or symptoms. Hematologic/Lymphatic The patient has no complaints or symptoms. Respiratory Complains or has symptoms of Shortness of Breath. Denies complaints or symptoms of Chronic or frequent coughs. Cardiovascular Complains or has symptoms of LE edema. Denies complaints or symptoms of Chest pain. Gastrointestinal Complains or has symptoms of Frequent diarrhea - chronic. Denies complaints or symptoms of Nausea, Vomiting. Endocrine The patient has no complaints or symptoms. Genitourinary The patient has no complaints or symptoms. Immunological Complains or has symptoms of Itching. Integumentary (Skin) Complains or has symptoms of Wounds, Swelling. Musculoskeletal Complains or has symptoms of Muscle Pain, Muscle Weakness. Neurologic The patient has no complaints or symptoms. Psychiatric The patient has no complaints or symptoms. Medications oxycodone 5 mg tablet oral 1 1 tablet oral three times daily warfarin 5 mg tablet oral one tablet oral (5  mg.) on Monday, Wednesday, Friday; one and one half tablets (7.5 mg.) Tuesday, Thursday, Saturday, Sunday atenolol 100 mg tablet oral one and one half tablets oral (150 mg.) daily calcium-magnesium-zinc tablet oral 1 1 tablet oral daily digoxin 250 mcg tablet oral 1 1 tablet oral daily allopurinol 300 mg tablet oral 1 1 tablet oral daily furosemide 20 mg tablet oral 1 1 tablet oral daily magnesium oxide 400 mg tablet oral 1 1 tablet oral two times daily potassium chloride ER 10 mEq tablet,extended release oral 1 1 tablet extended release oral daily temazepam 15 mg capsule oral 1 1 capsule oral nightly methocarbamol 500 mg tablet oral 1 1 tablet oral three times daily bacitracin 500 unit/gram topical ointment topical ointment topical apply three times daily Lance Cook, Lance Cook ([id number]) mupirocin 2 % topical ointment topical ointment topical apply three times daily silver sulfadiazine 1 % topical cream topical cream topical apply two times daily Objective Constitutional Pulse regular. Respirations normal and unlabored. Afebrile. Vitals Time Taken: 8:47 AM, Height: 60 in, Source: Stated, Weight: 215 lbs, Source: Stated, BMI: 42, Temperature: 98.4 F, Pulse: 65 bpm, Blood Pressure: 108/54 mmHg. General Notes: hgb A1C 4.5, 2-3 weeks ago Eyes Nonicteric. Reactive to light. Ears, Nose, Mouth, and Throat Lips, teeth, and gums WNL.Marland Kitchen Moist mucosa without lesions. Neck supple and nontender. No palpable supraclavicular or cervical adenopathy. Normal sized without goiter. Respiratory WNL. No retractions.. Cardiovascular Pedal Pulses WNL. ABIs are noncompressible. No clubbing, cyanosis or edema. Genitourinary (GU) No hydrocele, spermatocele, tenderness of the cord, or testicular mass.Marland Kitchen  Penis without lesions.Lance Cook without lesions. No cystocele, or rectocele. Pelvic support intact, no discharge.Marland Kitchen Urethra without masses, tenderness or scarring.Marland Kitchen Lymphatic No adneopathy. No adenopathy. No  adenopathy. Musculoskeletal Adexa without tenderness or enlargement.. Digits and nails w/o clubbing, cyanosis, infection, petechiae, ischemia, or inflammatory conditions.Marland Kitchen Psychiatric Judgement and insight Intact.. No evidence of depression, anxiety, or agitation.. General Notes: he has massive lymphedema which is a stage II and has signs and symptoms of venous insufficiency with hemosiderin staining both lower extremities and multiple superficial ulcerations both lower Lance Cook, Lance Cook ([id number]) legs. None of them required debridement. Integumentary (Hair, Skin) No suspicious lesions. No crepitus or fluctuance. No peri-wound warmth or erythema. No masses.. Wound #1 status is Open. Original cause of wound was Gradually Appeared. The wound is located on the Left Lower Leg. The wound measures 11cm length x 16cm width x 0.2cm depth; 138.23cm^2 area and 27.646cm^3 volume. The wound is limited to skin breakdown. There is no tunneling or undermining noted. There is a none present amount of drainage noted. The wound margin is flat and intact. There is medium (34-66%) red granulation within the wound bed. There is a small (1-33%) amount of necrotic tissue within the wound bed including Adherent Slough. The periwound skin appearance did not exhibit: Callus, Crepitus, Excoriation, Fluctuance, Friable, Induration, Localized Edema, Rash, Scarring, Dry/Scaly, Maceration, Moist, Atrophie Blanche, Cyanosis, Ecchymosis, Hemosiderin Staining, Mottled, Pallor, Rubor, Erythema. The periwound has tenderness on palpation. Wound #2 status is Open. Original cause of wound was Gradually Appeared. The wound is located on the Right Lower Leg. The wound measures 10cm length x 11cm width x 0.2cm depth; 86.394cm^2 area and 17.279cm^3 volume. The wound is limited to skin breakdown. There is no tunneling or undermining noted. There is a none present amount of drainage noted. The wound margin is flat and intact. There is large  (67- 100%) red, pink granulation within the wound bed. There is a small (1-33%) amount of necrotic tissue within the wound bed including Adherent Slough. The periwound skin appearance did not exhibit: Callus, Crepitus, Excoriation, Fluctuance, Friable, Induration, Localized Edema, Rash, Scarring, Dry/Scaly, Maceration, Moist, Atrophie Blanche, Cyanosis, Ecchymosis, Hemosiderin Staining, Mottled, Pallor, Rubor, Erythema. The periwound has tenderness on palpation. Assessment Active Problems ICD-10 L97.211 - Non-pressure chronic ulcer of right calf limited to breakdown of skin L97.221 - Non-pressure chronic ulcer of left calf limited to breakdown of skin I87.311 - Chronic venous hypertension (idiopathic) with ulcer of right lower extremity I87.312 - Chronic venous hypertension (idiopathic) with ulcer of left lower extremity I89.0 - Lymphedema, not elsewhere classified This 55 year old gentleman has signs and symptoms typical of venous insufficiency, has been treated for this for several years, has massive lymphedema, has lymphedema pumps and we do steps of compression wraps. He does not use any of them. had a long extensive discussion with the patient and his wife and at the end of several questions I have recommended 1. Profore lite wraps to be tried and changed twice a week if required. Lance Cook, Lance Cook ([id number]) 2. Silver alginate over the wounds. 3. Elevation and exercise 4. Uses a lymph pumps at least twice a day for an hour each 5. Arterial and venous duplex studies to be repeated -- he wants to defer this for now 6. Vascular opinion from a tertiary care center for workup and advice. He will return to see me next week Plan Wound Cleansing: Wound #1 Left Lower Leg: May shower with protection. - if you keep wrap on Wound #2 Right Lower Leg: May shower  with protection. - if you keep wrap on Primary Wound Dressing: Wound #1 Left Lower Leg: Aquacel Ag Wound #2 Right Lower Leg: Aquacel  Ag Secondary Dressing: Wound #1 Left Lower Leg: Dry Gauze Wound #2 Right Lower Leg: Dry Gauze Dressing Change Frequency: Wound #1 Left Lower Leg: Change dressing every week Wound #2 Right Lower Leg: Change dressing every week Follow-up Appointments: Wound #1 Left Lower Leg: Return Appointment in 1 week. Wound #2 Right Lower Leg: Return Appointment in 1 week. Edema Control: Wound #1 Left Lower Leg: 2 Layer Lite Compression System - Bilateral Elevate legs to the level of the heart and pump ankles as often as possible Other: - lymphedema pumps twice a day Wound #2 Right Lower Leg: 2 Layer Lite Compression System - Bilateral Elevate legs to the level of the heart and pump ankles as often as possible Other: - lymphedema pumps twice a day Consults ordered were: Vascular - need work up for arterial studies Bilateral legs General Notes: seek second opinion for existing condition and treatment if patient choices to change course of plan: Lance Cook, Lance Cook ([id number]) This 55 year old gentleman has signs and symptoms typical of venous insufficiency, has been treated for this for several years, has massive lymphedema, has lymphedema pumps and we do steps of compression wraps. He does not use any of them. had a long extensive discussion with the patient and his wife and at the end of several questions I have recommended 1. Profore lite wraps to be tried and changed twice a week if required. 2. Silver alginate over the wounds. 3. Elevation and exercise 4. Uses a lymph pumps at least twice a day for an hour each 5. Arterial and venous duplex studies to be repeated -- he wants to defer this for now 6. Vascular opinion from a tertiary care center for workup and advice. He will return to see me next week Electronic Signature(s) Signed: 02/15/2016 4:55:03 PM By: Christin Fudge MD, FACS Previous Signature: 02/15/2016 10:14:48 AM Version By: Christin Fudge MD, FACS Entered By: Christin Fudge on 02/15/2016 16:55:03 Lance Cook ([id number]) -------------------------------------------------------------------------------- ROS/PFSH Details Patient Name: Lance Cook Date of Service: 02/15/2016 8:45 AM Medical Record Number: [id number] Patient Account Number: [id number] Date of Birth/Sex: 03/12/1961 (55 y.o. 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, VRE)o Yes Have you tested positive for osteomyelitis (bone infection)o No Have you had any tests for circulation on your legso Yes Have you had other problems associated with your woundso Swelling Respiratory Complaints and Symptoms: Positive for: Shortness of Breath Negative for: Chronic or frequent coughs Medical History: Positive for: Sleep Apnea - c pap Negative for: Aspiration; Asthma; Chronic Obstructive Pulmonary Disease (COPD); Pneumothorax; Tuberculosis Cardiovascular Complaints and Symptoms: Positive for: LE edema Negative for: Chest pain Medical History: Positive for: Congestive Heart Failure; Coronary Artery Disease; Deep Vein Thrombosis; Hypertension Negative for: Angina; Hypotension; Myocardial Infarction Gastrointestinal Complaints and Symptoms: Positive for: Frequent diarrhea - chronic  Negative for: Nausea; Vomiting Medical HistoryCHANDARA, Lance Cook ([id number]) Negative for: Cirrhosis ;  Colitis; Crohnos; Hepatitis A; Hepatitis B; Hepatitis C Immunological Complaints and Symptoms: Positive for: Itching Medical History: Negative for: Lupus Erythematosus; Raynaudos; Scleroderma Integumentary (Skin) Complaints and Symptoms: Positive for: Wounds; Swelling Medical History: Negative for: History of Burn; History of pressure wounds Musculoskeletal Complaints and Symptoms: Positive for: Muscle Pain; Muscle Weakness Medical History: Negative for: Gout; Rheumatoid Arthritis; Osteoarthritis; Osteomyelitis Constitutional Symptoms (General Health) Complaints and Symptoms: No Complaints or Symptoms Eyes Complaints and Symptoms: No Complaints or Symptoms Medical History: Negative for: Cataracts; Glaucoma; Optic Neuritis Ear/Nose/Mouth/Throat Complaints and Symptoms: No Complaints or Symptoms Hematologic/Lymphatic Complaints and Symptoms: No Complaints or Symptoms Medical History: Negative for: Anemia; Hemophilia; Human Immunodeficiency Virus; Lymphedema; Sickle Cell Disease Lance Cook, Lance Cook ([id number]) Endocrine Complaints and Symptoms: No Complaints or Symptoms Medical History: Negative for: Type I Diabetes; Type II Diabetes Genitourinary Complaints and Symptoms: No Complaints or Symptoms Medical History: Negative for: End Stage Renal Disease Neurologic Complaints and Symptoms: No Complaints or Symptoms Medical History: Negative for: Dementia; Neuropathy; Quadriplegia; Paraplegia; Seizure Disorder Oncologic Medical History: Positive for: Received Radiation Psychiatric Complaints and Symptoms: No Complaints or Symptoms Family and Social History Cancer: Yes; Diabetes: Yes - Mother,; Heart Disease: Yes; Hypertension: Yes; Kidney Disease: No; Light tobacco smoker - few times a year; Marital Status - Married; Alcohol Use: Rarely; Caffeine Use: Rarely; Financial Concerns: No; Food, Clothing or Shelter Needs: No; Support System Lacking: No; Transportation Concerns:  No Physician Affirmation I have reviewed and agree with the above information. Electronic Signature(s) Signed: 02/15/2016 9:33:35 AM By: Christin Fudge MD, FACS Signed: 02/15/2016 2:14:47 PM By: Rebecca Eaton RN, Romero Liner By: Christin Fudge on 02/15/2016 09:33:34 Lance Cook ([id number]) -------------------------------------------------------------------------------- SuperBill Details Patient Name: Lance Cook Date of Service: 02/15/2016 Medical Record Number: [id number] Patient Account Number: [id number] Date of Birth/Sex: 25-Aug-1961 ([name]) Treating RN: Macarthur Critchley Primary Care Physician: Roe Coombs Other Clinician: Referring Physician: Roe Coombs Treating Physician/Extender: Frann Rider in Treatment: 0 Diagnosis Coding ICD-10 Codes Code Description [id number] Non-pressure chronic ulcer of right calf limited to breakdown of skin L97.221 Non-pressure chronic ulcer of left calf limited to breakdown of skin I87.311 Chronic venous hypertension (idiopathic) with ulcer of right lower extremity I87.312 Chronic venous hypertension (idiopathic) with ulcer of left lower extremity I89.0 Lymphedema, not elsewhere classified Facility Procedures CPT4: Description Modifier Quantity Code [id number] - WOUND CARE VISIT-LEV 4 EST PT 1 CPT4: [id number] BILATERAL: Application of multi-layer venous compression 1 system; leg (below knee), including ankle and foot. Physician Procedures CPT4 Code Description: [id number] - WC PHYS LEVEL 4 - NEW PT ICD-10 Description Diagnosis L97.211 Non-pressure chronic ulcer of right calf limited to L97.221 Non-pressure chronic ulcer of left calf limited to b I87.311 Chronic venous hypertension  (idiopathic) with ulcer I87.312 Chronic venous hypertension (idiopathic) with ulcer Modifier: breakdown of sk reakdown of ski of right lower of left lower e Quantity: 1 in n extremity xtremity Electronic Signature(s) Signed: 02/15/2016 2:14:47 PM By:  Ardean Larsen Signed: 02/15/2016 4:58:26 PM By: Christin Fudge MD, FACS Previous Signature: 02/15/2016 10:16:03 AM Version By: Christin Fudge MD, FACS Entered By: Rebecca Eaton RN, [place] on 02/15/2016 12:39:39

## 2016-02-15 NOTE — Progress Notes (Signed)
Lance Cook, Lance Cook (Lance Cook [place]:[id number]) Visit Report for 02/15/2016 Abuse/Suicide Risk Screen Details Patient Name: Lance Cook, Lance Cook Date of Service: 02/15/2016 8:45 AM Medical Record Number: Lance [id number] Patient Account Number: [id number] Date of Birth/Sex: 01-18-1961 (55 y.o. Male) Treating RN: Lance Cook Primary Care Physician: Lance Cook Other Clinician: Referring Physician: Roe Cook Treating Physician/Extender: Lance Cook in Treatment: 0 Abuse/Suicide Risk Screen Items Answer ABUSE/SUICIDE RISK SCREEN: Has anyone close to you tried to hurt or harm you recentlyo No Do you feel uncomfortable with anyone in your familyo No Has anyone forced you do things that you didnot want to doo No Do you have any thoughts of harming yourselfo No Patient displays signs or symptoms of abuse and/or neglect. No Electronic Signature(s) Signed: 02/15/2016 2:14:47 PM By: Lance Eaton, RN, Lance Cook By: Lance Eaton RN, Lance Cook on 02/15/2016 08:54:41 Lance Cook ([id number]) -------------------------------------------------------------------------------- Activities of Daily Living Details Patient Name: Lance Cook Date of Service: 02/15/2016 8:45 AM Medical Record Number: Lance [id number] Patient Account Number: [id number] Date of Birth/Sex: 1961-03-24 (55 y.o. Male) Treating RN: Lance Cook Primary Care Physician: Lance Cook Other Clinician: Referring Physician: Roe Cook Treating Physician/Extender: Lance Cook in Treatment: 0 Activities of Daily Living Items Answer Activities of Daily Living (Please select one for each item) Drive Automobile Completely Able Take Medications Completely Able Use Telephone Completely Able Care for Appearance Completely Able Use Toilet Completely Able Bath / Shower Completely Able Dress Self Completely Able Feed Self Completely Able Walk Completely Able Get In / Out Bed Completely [place] for Self Completely Able Electronic Signature(s) Signed: 02/15/2016 2:14:47 PM By: Lance Eaton, RN, Lance Cook Entered By: Lance Eaton RN, Lance Cook on 02/15/2016 08:55:06 Lance Cook ([place]:[id number]) -------------------------------------------------------------------------------- Education Assessment Details Patient Name: Lance Cook Date of Service: 02/15/2016 8:45 AM Medical Record Number: [id number] Patient Account Number: [id number] Date of Birth/Sex: [birth year] (55 y.o. Male) Treating RN: Lance Cook Primary Care Physician: Lance Cook Other Clinician: Referring Physician: Roe Cook Treating Physician/Extender: Lance Cook in Treatment: 0 Primary Learner Assessed: Patient Learning Preferences/Education Level/Primary Language Learning Preference: Explanation Preferred Language: English Cognitive Barrier Assessment/Beliefs Language Barrier: No Translator Needed: No Memory Deficit: No Emotional Barrier: No Cultural/Religious Beliefs Affecting Medical No Care: Physical Barrier Assessment Impaired Vision: Yes Glasses Impaired Hearing: No Decreased Hand dexterity: No Knowledge/Comprehension Assessment Knowledge Level: High Comprehension Level: High Ability to understand written High instructions: Ability to understand verbal High instructions: Motivation Assessment Anxiety Level: Calm Cooperation: Cooperative Education Importance: Acknowledges Need Interest in Health Problems: Asks Questions Perception: Coherent Willingness to Engage in Self- High Management Activities: Readiness to Engage in Self- High Management Activities: Electronic Signature(s) Signed: 02/15/2016 2:14:47 PM By: Lance Eaton RN, Lance Cook, Lance Cook ([id number]) Entered By: Lance Eaton RN, Lance Cook on 02/15/2016 08:55:38 Lance Cook ([place]:[id number]) -------------------------------------------------------------------------------- Fall Risk Assessment Details Patient Name: Lance Cook Date of Service: 02/15/2016 8:45 AM Medical Record Number: [id number] Patient Account Number: [id number] Date of Birth/Sex: 08-23-1961 (55 y.o. Male) Treating RN: Lance Cook Primary Care Physician: Lance Cook Other Clinician: Referring Physician: Roe Cook Treating Physician/Extender: Lance Cook in Treatment: 0 Fall Risk Assessment Items Have you had 2 or more falls in the last 12 monthso 0 No Have you had any fall that resulted in injury in the last 12 monthso 0 No FALL RISK ASSESSMENT: History of falling - immediate or within 3 months 0 No Secondary diagnosis 15 Yes Ambulatory aid None/bed rest/wheelchair/nurse 0 No Crutches/cane/walker 0 No Furniture 0 No IV Access/Saline Lock 0 No Gait/Training Normal/bed  rest/immobile 0 No Weak 0 No Impaired 0 No Mental Status Oriented to own ability 0 No Electronic Signature(s) Signed: 02/15/2016 2:14:47 PM By: Lance Eaton, RN, Lance Cook Entered By: Lance Eaton, RN, Lance Cook on 02/15/2016 08:55:50 Lance Cook ([id number]) -------------------------------------------------------------------------------- Foot Assessment Details Patient Name: Lance Cook Date of Service: 02/15/2016 8:45 AM Medical Record Number: [id number] Patient Account Number: [id number] Date of Birth/Sex: [birth year] (55 y.o. Male) Treating RN: Lance Cook Primary Care Physician: Lance Cook Other Clinician: Referring Physician: Roe Cook Treating Physician/Extender: Lance Cook in Treatment: 0 Foot Assessment Items Site Locations + = Sensation present, - = Sensation absent, C = Callus, U = Ulcer R = Redness, W = Warmth, M = Maceration, PU = Pre-ulcerative lesion F = Fissure, S = Swelling, D = Dryness Assessment Right: Left: Other Deformity: No No Prior Foot Ulcer: No No Prior Amputation: No No Charcot Joint: No No Ambulatory Status: Ambulatory Without Help Gait: Steady Electronic Signature(s) Signed: 02/15/2016 2:14:47 PM By:  Lance Eaton, RN, Lance Cook Entered By: Lance Eaton, RN, Lance Cook on 02/15/2016 08:56:29 Lance Cook ([id number]) -------------------------------------------------------------------------------- Nutrition Risk Assessment Details Patient Name: Lance Cook Date of Service: 02/15/2016 8:45 AM Medical Record Number: [id number] Patient Account Number: [id number] Date of Birth/Sex: Jul 11, 1961 (55 y.o. Male) Treating RN: Lance Cook Primary Care Physician: Lance Cook Other Clinician: Referring Physician: Roe Cook Treating Physician/Extender: Lance Cook in Treatment: 0 Height (in): 60 Weight (lbs): 215 Body Mass Index (BMI): 42 Nutrition Risk Assessment Items NUTRITION RISK SCREEN: I have an illness or condition that made me change the kind and/or 0 No amount of food I eat I eat fewer than two meals per day 0 No I eat few fruits and vegetables, or milk products 0 No I have three or more drinks of beer, liquor or wine almost every day 0 No I have tooth or mouth problems that make it hard for me to eat 0 No I don't always have enough money to buy the food I need 0 No I eat alone most of the time 0 No I take three or more different prescribed or over-the-counter drugs a 1 Yes day Without wanting to, I have lost or gained 10 pounds in the last six 0 No months I am not always physically able to shop, cook and/or feed myself 0 No Nutrition Protocols Good Risk Protocol 0 No interventions needed Moderate Risk Protocol Electronic Signature(s) Signed: 02/15/2016 2:14:47 PM By: Lance Eaton RN, Lance Cook Entered By: Lance Eaton, RN, Lance Cook on 02/15/2016 08:56:07

## 2016-02-15 NOTE — Progress Notes (Signed)
[place], [place]IL[place] ([id number]) Visit Report for 02/15/2016 Allergy List Details Patient Name: Lance Cook, Lance Cook Date of Service: 02/15/2016 8:45 AM Medical Record Number: [id number] Patient Account Number: [id number] Date of Birth/Sex: 03-31-1961 (55 y.o. Male) Treating RN: Macarthur Critchley Primary Care Physician: Roe Coombs Other Clinician: Referring Physician: Roe Coombs Treating Physician/Extender: Frann Rider in Treatment: 0 Allergies Active Allergies pencillin Allergy Notes Electronic Signature(s) Signed: 02/15/2016 2:14:47 PM By: Rebecca Eaton, RN, Sendra Entered By: Rebecca Eaton RN, Sendra on 02/15/2016 09:11:04 Lance Cook ([id number]) -------------------------------------------------------------------------------- Arrival Information Details Patient Name: Lance Cook Date of Service: 02/15/2016 8:45 AM Medical Record Number: [id number] Patient Account Number: [id number] Date of Birth/Sex: [birth year] (55 y.o. Male) Treating RN: Macarthur Critchley Primary Care Physician: Roe Coombs Other Clinician: Referring Physician: Roe Coombs Treating Physician/Extender: Frann Rider in Treatment: 0 Visit Information Patient Arrived: Ambulatory Arrival Time: 08:40 Accompanied By: wife Transfer Assistance: None Patient Identification Verified: Yes Secondary Verification Process Yes Completed: Patient Requires Transmission- No Based Precautions: Patient Has Alerts: Yes Patient Alerts: Patient on Blood Thinner Electronic Signature(s) Signed: 02/15/2016 2:14:47 PM By: Rebecca Eaton, RN, Sendra Entered By: Rebecca Eaton RN, Sendra on 02/15/2016 08:43:06 Lance Cook ([id number]) -------------------------------------------------------------------------------- Clinic Level of Care Assessment Details Patient Name: Lance Cook Date of Service: 02/15/2016 8:45 AM Medical Record Number: [id number] Patient Account Number: [id number] Date of Birth/Sex: Feb 06, 1961 (55 y.o. Male) Treating RN: Macarthur Critchley Primary Care Physician: Roe Coombs Other Clinician: Referring Physician: Roe Coombs Treating Physician/Extender: Frann Rider in Treatment: 0 Clinic Level of Care Assessment Items TOOL 2 Quantity Score X - Use when only an EandM is performed on the INITIAL visit 1 0 ASSESSMENTS - Nursing Assessment / Reassessment X - General Physical Exam (combine w/ comprehensive assessment (listed just 1 20 below) when performed on new pt. evals) X - Comprehensive Assessment (HX, ROS, Risk Assessments, Wounds Hx, etc.) 1 25 ASSESSMENTS - Wound and Skin Assessment / Reassessment []  - Simple Wound Assessment / Reassessment - one wound 0 X - Complex Wound Assessment / Reassessment - multiple wounds 2 5 []  - Dermatologic / Skin Assessment (not related to wound area) 0 ASSESSMENTS - Ostomy and/or Continence Assessment and Care []  - Incontinence Assessment and Management 0 []  - Ostomy Care Assessment and Management (repouching, etc.) 0 PROCESS - Coordination of Care X - Simple Patient / Family Education for ongoing care 1 15 []  - Complex (extensive) Patient / Family Education for ongoing care 0 X - Staff obtains Programmer, systems, Records, Test Results / Process Orders 1 10 []  - Staff telephones HHA, Nursing Homes / Clarify orders / etc 0 []  - Routine Transfer to another Facility (non-emergent condition) 0 []  - Routine Hospital Admission (non-emergent condition) 0 []  - New Admissions / Biomedical engineer / Ordering NPWT, Apligraf, etc. 0 []  - Emergency Hospital Admission (emergent condition) 0 X - Simple Discharge Coordination 1 10 Lance Cook, Lance Cook ([id number]) []  - Complex (extensive) Discharge Coordination 0 PROCESS - Special Needs []  - Pediatric / Minor Patient Management 0 []  - Isolation Patient Management 0 []  - Hearing / Language / Visual special needs 0 []  - Assessment of Community assistance (transportation, D/C planning, etc.) 0 []  - Additional assistance / Altered mentation 0 []   - Support Surface(s) Assessment (bed, cushion, seat, etc.) 0 INTERVENTIONS - Wound Cleansing / Measurement X - Wound Imaging (photographs - any number of wounds) 1 5 []  - Wound Tracing (instead of photographs) 0 []  - Simple Wound Measurement - one wound 0 X - Complex Wound Measurement - multiple wounds 2 5 []  -  Simple Wound Cleansing - one wound 0 X - Complex Wound Cleansing - multiple wounds 2 5 INTERVENTIONS - Wound Dressings []  - Small Wound Dressing one or multiple wounds 0 X - Medium Wound Dressing one or multiple wounds 2 15 []  - Large Wound Dressing one or multiple wounds 0 []  - Application of Medications - injection 0 INTERVENTIONS - Miscellaneous []  - External ear exam 0 []  - Specimen Collection (cultures, biopsies, blood, body fluids, etc.) 0 []  - Specimen(s) / Culture(s) sent or taken to Lab for analysis 0 []  - Patient Transfer (multiple staff / Harrel Lemon Lift / Similar devices) 0 []  - Simple Staple / Suture removal (25 or less) 0 []  - Complex Staple / Suture removal (26 or more) 0 Lance Cook, Lance Cook ([id number]) []  - Hypo / Hyperglycemic Management (close monitor of Blood Glucose) 0 []  - Ankle / Brachial Index (ABI) - do not check if billed separately 0 Has the patient been seen at the hospital within the last three years: Yes Total Score: 145 Level Of Care: New/Established - Level 4 Electronic Signature(s) Signed: 02/15/2016 2:14:47 PM By: Rebecca Eaton, RN, Sendra Entered By: Rebecca Eaton, RN, Sendra on 02/15/2016 10:09:32 Lance Cook ([id number]) -------------------------------------------------------------------------------- Encounter Discharge Information Details Patient Name: Lance Cook Date of Service: 02/15/2016 8:45 AM Medical Record Number: [id number] Patient Account Number: [id number] Date of Birth/Sex: 1960/11/08 (55 y.o. Male) Treating RN: Macarthur Critchley Primary Care Physician: Roe Coombs Other Clinician: Referring Physician: Roe Coombs Treating Physician/Extender:  Frann Rider in Treatment: 0 Encounter Discharge Information Items Discharge Pain Level: 5 Discharge Condition: Stable Ambulatory Status: Ambulatory Discharge Destination: Home Transportation: Private Auto Accompanied By: wife Schedule Follow-up Appointment: Yes Medication Reconciliation completed and provided to Patient/Care Yes Makeba [place]: Provided on Clinical Summary of Care: 02/15/2016 Form Type Recipient Paper Patient JC Electronic Signature(s) Signed: 02/15/2016 10:12:32 AM By: Ruthine Dose Entered By: Ruthine Dose on 02/15/2016 10:12:32 Lance Cook ([id number]) -------------------------------------------------------------------------------- Lower Extremity Assessment Details Patient Name: Lance Cook Date of Service: 02/15/2016 8:45 AM Medical Record Number: [id number] Patient Account Number: [id number] Date of Birth/Sex: 1960-12-28 (55 y.o. Male) Treating RN: Macarthur Critchley Primary Care Physician: Roe Coombs Other Clinician: Referring Physician: Roe Coombs Treating Physician/Extender: Frann Rider in Treatment: 0 Edema Assessment Assessed: [Left: No] [Right: No] Edema: [Left: Ye] [Right: s] Calf Left: Right: Point of Measurement: 30 cm From Medial Instep 50 cm 48.5 cm Ankle Left: Right: Point of Measurement: 13 cm From Medial Instep 32.8 cm 30 cm Vascular Assessment Claudication: Claudication Assessment [Left:Rest Pain] [Right:Rest Pain] Pulses: Posterior Tibial Dorsalis Pedis Doppler: [Left:Monophasic] [Right:Monophasic] Extremity colors, hair growth, and conditions: Extremity Color: [Left:Mottled] [Right:Mottled] Hair Growth on Extremity: [Left:Yes] [Right:Yes] Temperature of Extremity: [Left:Warm] [Right:Warm] Capillary Refill: [Left:< 3 seconds] [Right:< 3 seconds] Toe Nail Assessment Left: Right: Thick: No No Discolored: No No Deformed: No No Improper Length and Hygiene: No No Electronic Signature(s) Signed: 02/15/2016 2:14:47  PM By: Rebecca Eaton, RN, Sendra Entered By: Rebecca Eaton RN, Sendra on 02/15/2016 08:54:22 Lance Cook, Lance Cook ([id number]) Lance Cook, Lance Cook ([id number]) -------------------------------------------------------------------------------- Multi Wound Chart Details Patient Name: Lance Cook Date of Service: 02/15/2016 8:45 AM Medical Record Number: [id number] Patient Account Number: [id number] Date of Birth/Sex: 13-Jan-1961 (55 y.o. Male) Treating RN: Macarthur Critchley Primary Care Physician: Roe Coombs Other Clinician: Referring Physician: Roe Coombs Treating Physician/Extender: Frann Rider in Treatment: 0 Vital Signs Height(in): 60 Pulse(bpm): 65 Weight(lbs): 215 Blood Pressure 108/54 (mmHg): Body Mass Index(BMI): 42 Temperature(F): 98.4 Respiratory Rate (breaths/min): Photos: [1:No Photos] [2:No Photos] [N/A:N/A] Wound Location: [1:Left Lower Leg] [2:Right Lower Leg] [N/A:N/A]  Wounding Event: [1:Gradually Appeared] [2:Gradually Appeared] [N/A:N/A] Primary Etiology: [1:To be determined] [2:To be determined] [N/A:N/A] Comorbid History: [1:Sleep Apnea, Congestive Sleep Apnea, Congestive N/A Heart Failure, Coronary Heart Failure, Coronary Artery Disease, Deep Vein Artery Disease, Deep Vein Thrombosis, Hypertension, Received Hypertension, Received Radiation]  [2:Thrombosis, Radiation] Date Acquired: [1:01/31/2016] [2:01/31/2016] [N/A:N/A] Weeks of Treatment: [1:0] [2:0] [N/A:N/A] Wound Status: [1:Open] [2:Open] [N/A:N/A] Clustered Wound: [1:Yes] [2:Yes] [N/A:N/A] Measurements L x W x D 11x16x0.2 [2:10x11x0.2] [N/A:N/A] (cm) Area (cm) : [1:138.23] [2:86.394] [N/A:N/A] Volume (cm) : [1:27.646] [2:17.279] [N/A:N/A] Classification: [1:Partial Thickness] [2:Partial Thickness] [N/A:N/A] Exudate Amount: [1:None Present] [2:None Present] [N/A:N/A] Wound Margin: [1:Flat and Intact] [2:Flat and Intact] [N/A:N/A] Granulation Amount: [1:Medium (34-66%)] [2:Large (67-100%)] [N/A:N/A] Granulation Quality:  [1:Red] [2:Red, Pink] [N/A:N/A] Necrotic Amount: [1:Small (1-33%)] [2:Small (1-33%)] [N/A:N/A] Exposed Structures: [1:Fascia: No Fat: No Tendon: No Muscle: No Joint: No Bone: No] [2:Fascia: No Fat: No Tendon: No Muscle: No Joint: No Bone: No] [N/A:N/A] Limited to Skin Limited to Skin Breakdown Breakdown Epithelialization: None None N/A Periwound Skin Texture: Edema: No Edema: No N/A Excoriation: No Excoriation: No Induration: No Induration: No Callus: No Callus: No Crepitus: No Crepitus: No Fluctuance: No Fluctuance: No Friable: No Friable: No Rash: No Rash: No Scarring: No Scarring: No Periwound Skin Maceration: No Maceration: No N/A Moisture: Moist: No Moist: No Dry/Scaly: No Dry/Scaly: No Periwound Skin Color: Atrophie Blanche: No Atrophie Blanche: No N/A Cyanosis: No Cyanosis: No Ecchymosis: No Ecchymosis: No Erythema: No Erythema: No Hemosiderin Staining: No Hemosiderin Staining: No Mottled: No Mottled: No Pallor: No Pallor: No Rubor: No Rubor: No Tenderness on Yes Yes N/A Palpation: Wound Preparation: Ulcer Cleansing: Other: Ulcer Cleansing: Other: N/A soap and water soap and water Topical Anesthetic Topical Anesthetic Applied: Other Applied: None Treatment Notes Electronic Signature(s) Signed: 02/15/2016 2:14:47 PM By: Rebecca Eaton, RN, Sendra Entered By: Rebecca Eaton RN, Sendra on 02/15/2016 09:23:29 Lance Cook ([id number]) -------------------------------------------------------------------------------- Multi-Disciplinary Care Plan Details Patient Name: Lance Cook Date of Service: 02/15/2016 8:45 AM Medical Record Number: [id number] Patient Account Number: [id number] Date of Birth/Sex: Oct 27, 1960 (56 y.o. Male) Treating RN: Macarthur Critchley Primary Care Physician: Roe Coombs Other Clinician: Referring Physician: Roe Coombs Treating Physician/Extender: Frann Rider in Treatment: 0 Active Inactive Orientation to the Wound Care  Program Nursing Diagnoses: Knowledge deficit related to the wound healing center program Goals: Patient/caregiver will verbalize understanding of the [facility] Program Date Initiated: 02/15/2016 Goal Status: Active Interventions: Provide education on orientation to the wound center Notes: Wound/Skin Impairment Nursing Diagnoses: Impaired tissue integrity Goals: Patient/caregiver will verbalize understanding of skin care regimen Date Initiated: 02/15/2016 Goal Status: Active Ulcer/skin breakdown will have a volume reduction of 30% by week 4 Date Initiated: 02/15/2016 Goal Status: Active Ulcer/skin breakdown will have a volume reduction of 50% by week 8 Date Initiated: 02/15/2016 Goal Status: Active Ulcer/skin breakdown will have a volume reduction of 80% by week 12 Date Initiated: 02/15/2016 Goal Status: Active Ulcer/skin breakdown will heal within 14 weeks Date Initiated: 02/15/2016 Goal Status: Active Lance Cook, Lance Cook ([id number]) Interventions: Assess patient/caregiver ability to obtain necessary supplies Assess patient/caregiver ability to perform ulcer/skin care regimen upon admission and as needed Assess ulceration(s) every visit Provide education on ulcer and skin care Notes: Electronic Signature(s) Signed: 02/15/2016 2:14:47 PM By: Rebecca Eaton, RN, Sendra Entered By: Rebecca Eaton RN, Sendra on 02/15/2016 09:23:02 Lance Cook ([id number]) -------------------------------------------------------------------------------- Pain Assessment Details Patient Name: Lance Cook Date of Service: 02/15/2016 8:45 AM Medical Record Number: [id number] Patient Account Number: [id number] Date of Birth/Sex: 1961/06/28 (55 y.o. Male) Treating RN: Macarthur Critchley  Primary Care Physician: Roe Coombs Other Clinician: Referring Physician: Roe Coombs Treating Physician/Extender: Frann Rider in Treatment: 0 Active Problems Location of Pain Severity and Description of Pain Patient  Has Paino Yes Site Locations Rate the pain. Current Pain Level: 4 Pain Management and Medication Current Pain Management: Medication: Yes Electronic Signature(s) Signed: 02/15/2016 2:14:47 PM By: Rebecca Eaton, RN, Sendra Entered By: Rebecca Eaton RN, Sendra on 02/15/2016 08:43:27 Lance Cook ([id number]) -------------------------------------------------------------------------------- Patient/Caregiver Education Details Patient Name: Lance Cook Date of Service: 02/15/2016 8:45 AM Medical Record Number: [id number] Patient Account Number: [id number] Date of Birth/Gender: 07/24/1961 (55 y.o. Male) Treating RN: Macarthur Critchley Primary Care Physician: Roe Coombs Other Clinician: Referring Physician: Roe Coombs Treating Physician/Extender: Frann Rider in Treatment: 0 Education Assessment Education Provided To: Patient Education Topics Provided Welcome To [facility]: Handouts: Welcome To [facility] Methods: Explain/Verbal Responses: State content correctly Wound/Skin Impairment: Handouts: Caring for Your Ulcer, Skin Care Do's and Dont's Methods: Explain/Verbal Responses: State content correctly Electronic Signature(s) Signed: 02/15/2016 2:14:47 PM By: Rebecca Eaton, RN, Sendra Entered By: Rebecca Eaton, RN, Sendra on 02/15/2016 09:23:53 Lance Cook ([id number]) -------------------------------------------------------------------------------- Wound Assessment Details Patient Name: Lance Cook Date of Service: 02/15/2016 8:45 AM Medical Record Number: [id number] Patient Account Number: [id number] Date of Birth/Sex: 11-20-1960 (55 y.o. 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 Wounding Event: Gradually  Appeared Etiology: Date Acquired: 01/31/2016 Wound Open Weeks Of Treatment: 0 Status: Clustered Wound: Yes Comorbid Sleep Apnea, Congestive Heart History: Failure, Coronary Artery Disease, Deep Vein Thrombosis, Hypertension, Received Radiation Photos Wound Measurements Length: (cm) 11 Width: (cm) 16 Depth: (cm) 0.2 Area: (cm) 138.23 Volume: (cm) 27.646 % Reduction in Area: 0% % Reduction in Volume: 0% Epithelialization: None Tunneling: No Undermining: No Wound Description Classification: Partial Thickness Wound Margin: Flat and Intact Exudate Amount: None Present Foul Odor After Cleansing: No Wound Bed Granulation Amount: Medium (34-66%) Exposed Structure Granulation Quality: Red Fascia Exposed: No Necrotic Amount: Small (1-33%) Fat Layer Exposed: No Necrotic Quality: Adherent Slough Tendon Exposed: No Lance Cook, Lance Cook ([id number]) Muscle Exposed: No Joint Exposed: No Bone Exposed: No Limited to Skin Breakdown Periwound Skin Texture Texture Color No Abnormalities Noted: No No Abnormalities Noted: No Callus: No Atrophie Blanche: No Crepitus: No Cyanosis: No Excoriation: No Ecchymosis: No Fluctuance: No Erythema: No Friable: No Hemosiderin Staining: No Induration: No Mottled: No Localized Edema: No Pallor: No Rash: No Rubor: No Scarring: No Temperature / Pain Moisture Tenderness on Palpation: Yes No Abnormalities Noted: No Dry / Scaly: No Maceration: No Moist: No Wound Preparation Ulcer Cleansing: Other: soap and water, Topical Anesthetic Applied: Other Treatment Notes Wound #1 (Left Lower Leg) 1. Cleansed with: Cleanse wound with antibacterial soap and water 4. Dressing Applied: Aquacel Ag 7. Secured with 2 Layer Lite Compression System - Bilateral Electronic Signature(s) Signed: 02/15/2016 2:14:47 PM By: Rebecca Eaton, RN, Sendra Entered By: Rebecca Eaton RN, Sendra on 02/15/2016 12:14:07 Lance Cook  ([id number]) -------------------------------------------------------------------------------- Wound Assessment Details Patient Name: Lance Cook Date of Service: 02/15/2016 8:45 AM Medical Record Number: [id number] Patient Account Number: [id number] Date of Birth/Sex: 1961/03/18 (55 y.o. Male) Treating RN: Macarthur Critchley Primary Care Physician: Roe Coombs Other Clinician: Referring Physician: Roe Coombs Treating Physician/Extender: Frann Rider in Treatment: 0 Wound Status Wound Number: 2 Primary Venous Leg Ulcer Etiology: Wound Location: Right Lower Leg Secondary Lymphedema Wounding  Event: Gradually Appeared Etiology: Date Acquired: 01/31/2016 Wound Open Weeks Of Treatment: 0 Status: Clustered Wound: Yes Comorbid Sleep Apnea, Congestive Heart History: Failure, Coronary Artery Disease, Deep Vein Thrombosis, Hypertension, Received Radiation Photos Wound Measurements Length: (cm) 10 Width: (cm) 11 Depth: (cm) 0.2 Area: (cm) 86.394 Volume: (cm) 17.279 % Reduction in Area: 0% % Reduction in Volume: 0% Epithelialization: None Tunneling: No Undermining: No Wound Description Classification: Partial Thickness Wound Margin: Flat and Intact Exudate Amount: None Present Foul Odor After Cleansing: No Wound Bed Granulation Amount: Large (67-100%) Exposed Structure Granulation Quality: Red, Pink Fascia Exposed: No Necrotic Amount: Small (1-33%) Fat Layer Exposed: No Necrotic Quality: Adherent Slough Tendon Exposed: No Lance Cook, Lance Cook ([id number]) Muscle Exposed: No Joint Exposed: No Bone Exposed: No Limited to Skin Breakdown Periwound Skin Texture Texture Color No Abnormalities Noted: No No Abnormalities Noted: No Callus: No Atrophie Blanche: No Crepitus: No Cyanosis: No Excoriation: No Ecchymosis: No Fluctuance: No Erythema: No Friable: No Hemosiderin Staining: No Induration: No Mottled: No Localized Edema: No Pallor: No Rash: No Rubor:  No Scarring: No Temperature / Pain Moisture Tenderness on Palpation: Yes No Abnormalities Noted: No Dry / Scaly: No Maceration: No Moist: No Wound Preparation Ulcer Cleansing: Other: soap and water, Topical Anesthetic Applied: None Treatment Notes Wound #2 (Right Lower Leg) 1. Cleansed with: Cleanse wound with antibacterial soap and water 4. Dressing Applied: Aquacel Ag 7. Secured with 2 Layer Lite Compression System - Bilateral Electronic Signature(s) Signed: 02/15/2016 2:14:47 PM By: Rebecca Eaton, RN, Sendra Entered By: Rebecca Eaton RN, Sendra on 02/15/2016 12:14:34 Lance Cook ([id number]) -------------------------------------------------------------------------------- Vitals Details Patient Name: Lance Cook Date of Service: 02/15/2016 8:45 AM Medical Record Number: [id number] Patient Account Number: [id number] Date of Birth/Sex: July 09, 1961 (55 y.o. Male) Treating RN: Macarthur Critchley Primary Care Physician: Roe Coombs Other Clinician: Referring Physician: Roe Coombs Treating Physician/Extender: Frann Rider in Treatment: 0 Vital Signs Time Taken: 08:47 Temperature (F): 98.4 Height (in): 60 Pulse (bpm): 65 Source: Stated Blood Pressure (mmHg): 108/54 Weight (lbs): 215 Reference Range: 80 - 120 mg / dl Source: Stated Body Mass Index (BMI): 42 Notes hgb A1C 4.5, 2-3 weeks ago Electronic Signature(s) Signed: 02/15/2016 2:14:47 PM By: Rebecca Eaton, RN, Sendra Entered By: Rebecca Eaton, RN, Sendra on 02/15/2016 08:48:26

## 2016-02-18 ENCOUNTER — Encounter: Payer: Self-pay | Admitting: Thoracic Surgery (Cardiothoracic Vascular Surgery)

## 2016-02-18 ENCOUNTER — Ambulatory Visit (INDEPENDENT_AMBULATORY_CARE_PROVIDER_SITE_OTHER): Payer: Medicare HMO | Admitting: Thoracic Surgery (Cardiothoracic Vascular Surgery)

## 2016-02-18 VITALS — BP 139/72 | HR 78 | Resp 16 | Ht 61.5 in | Wt 217.8 lb

## 2016-02-18 DIAGNOSIS — Z952 Presence of prosthetic heart valve: Secondary | ICD-10-CM

## 2016-02-18 DIAGNOSIS — Z954 Presence of other heart-valve replacement: Secondary | ICD-10-CM | POA: Diagnosis not present

## 2016-02-18 DIAGNOSIS — I340 Nonrheumatic mitral (valve) insufficiency: Secondary | ICD-10-CM | POA: Diagnosis not present

## 2016-02-18 NOTE — Progress Notes (Signed)
[place]       [place], [address]             [id number]     CARDIOTHORACIC SURGERY OFFICE NOTE  Referring Provider is [name], MD PCP is [name], PA-C   HPI:  Patient is a 55 year old morbidly obese male with complex past medical history status post Bentall aortic root replacement using a [employer] Jude mechanical bileaflet aortic valve conduit in the remote past for type A aortic dissection, chronic diastolic heart failure, pulmonary hypertension, chronic persistent atrial fibrillation, and severe chronic lower extremity edema with venous insufficiency who returns to our office today for routine follow-up approximately one year status post minimally invasive mitral valve replacement using a bileaflet mechanical prosthetic valve on 01/25/2015 for severe symptomatic primary mitral regurgitation.  The patient's early postoperative recovery was complicated by ventilator-dependent respiratory failure from which he eventually recovered uneventfully. He was last seen here in our office on 04/30/2015 at which time he was making satisfactory progress.  He continues to follow-up intermittently with Dr. [name], who saw him most recently on 01/04/2016.  Since then he underwent routine follow-up echocardiogram that revealed normal left ventricular systolic function with normal functioning mechanical prosthetic valves in the aortic and mitral positions. The patient was noted to have moderate tricuspid regurgitation and findings consistent with moderate pulmonary hypertension. Right ventricular size and function appeared normal. The patient returns to our office for routine follow-up. He continues to struggle with generalized fatigue. He states that when he takes his prescribed Lasix for shortness of breath and lower extremity edema, the diuretic makes him feel washed out.  The swelling in his legs remains a chronic problem but both he and his wife note that he is doing  better than he was a year ago. He has been seen in wound clinic and the problems with venous ulcerations have gotten some better. He has recently been referred to [facility] for their vein clinic.  He has never had any chest pain or chest tightness. His wife complains that he spends most of his time in bed. The patient states that his activities are significantly limited by exertional shortness of breath but he does not have problems breathing at rest. His physical activities are limited primarily by exertional shortness of breath as well as chronic pain in both lower legs. The patient states that he does not feel any better than he did prior to his surgery one year ago.   Current Outpatient Prescriptions  Medication Sig Dispense Refill  . allopurinol (ZYLOPRIM) 300 MG tablet Take 300 mg by mouth daily.    Marland Kitchen atenolol (TENORMIN) 100 MG tablet Take 0.5 tablets (50 mg total) by mouth 2 (two) times daily.    Marland Kitchen b complex vitamins tablet Take 1 tablet by mouth daily.      . bacitracin 500 UNIT/GM ointment Apply topically as needed.    . Calcium-Magnesium-Zinc (CAL-MAG-ZINC PO) Take by mouth daily.      . clindamycin (CLEOCIN) 300 MG capsule 300 mg as needed (for dental appointments).     . digoxin (LANOXIN) 0.25 MG tablet TAKE 1 TABLET EVERY DAY 90 tablet 1  . furosemide (LASIX) 40 MG tablet Take 40 mg daily and may take extra 40 mg if has sob or edema 90 tablet 3  . hydrOXYzine (ATARAX/VISTARIL) 25 MG tablet Take 1 tablet (25 mg total) by mouth every 6 (six) hours as needed for itching. 12 tablet 0  .  magnesium oxide (MAG-OX) 400 (241.3 MG) MG tablet Take 1 tablet (400 mg total) by mouth 2 (two) times daily. 180 tablet 3  . methocarbamol (ROBAXIN) 500 MG tablet Take 1 tablet (500 mg total) by mouth 3 (three) times daily. 90 tablet 2  . ONE TOUCH ULTRA TEST test strip     . oxyCODONE (OXY IR/ROXICODONE) 5 MG immediate release tablet Take 1 tablet (5 mg total) by mouth every 8 (eight)  hours as needed for moderate pain or severe pain. 90 tablet 0  . potassium chloride (K-DUR) 10 MEQ tablet Take 2 tablets (20 mEq total) by mouth 2 (two) times daily. 120 tablet 1  . silver sulfADIAZINE (SILVADENE) 1 % cream Apply topically as needed.    . temazepam (RESTORIL) 15 MG capsule Take 15 mg by mouth at bedtime as needed for sleep.    Marland Kitchen warfarin (COUMADIN) 5 MG tablet Take 1 tablet (5 mg total) by mouth daily at 6 PM. 120 tablet 1   No current facility-administered medications for this visit.      Physical Exam:   BP 139/72 mmHg  Pulse 78  Resp 16  Ht 5' 1.5" (1.562 m)  Wt 217 lb 12.8 oz (98.793 kg)  BMI 40.49 kg/m2  SpO2 98%  General:  Obese but well appearing  Chest:   Clear to auscultation  CV:   Regular rate and rhythm with mechanical heart valve sounds and systolic murmur  Incisions:  Completely healed  Abdomen:  Soft nontender  Extremities:  Warm and well perfused with severe lower extremity venous insufficiency and swelling  Diagnostic Tests:  Echocardiography  Patient: Baris, Pajak MR #: [id number] Study Date: 01/17/2016 Gender: M Age: 66 Height: 154.9 cm Weight: 96.6 kg BSA: 2.09 m^2 Pt. Status: Room:  ATTENDING [name], MD ORDERING [name], MD [place], MD PERFORMING [facility], Outpatient SONOGRAPHER Jason Coop, RCS  cc:  ------------------------------------------------------------------- LV EF: 65% - 70%  ------------------------------------------------------------------- History: PMH: Patient is s/p MVR 01/25/15. Question endocarditis. Coronary artery disease. Aortic valve disease. Mitral valve disease. Risk factors: Hypertension. Obese. Dyslipidemia.  ------------------------------------------------------------------- Study Conclusions  - Left ventricle: The cavity size was normal. There was severe  concentric hypertrophy. Systolic function  was vigorous. The  estimated ejection fraction was in the range of 65% to 70%. Wall  motion was normal; there were no regional wall motion  abnormalities. The study is not technically sufficient to allow  evaluation of LV diastolic function. - Aortic valve: A mechanical prosthesis was present and functioning  normally. Peak velocity (S): 345 cm/s. Mean gradient (S): 28 mm  Hg. (Consistent with moderate aortic stenosis gradient) - Aorta: Post Bentall with ST Jude Mechanical Valve in 1988. Aortic  arch diameter: 40 mm. - Aortic arch: The aortic arch was mildly dilated. - Mitral valve: A mechanical prosthesis was present and functioning  normally. - Left atrium: The atrium was severely dilated. - Right ventricle: The cavity size was mildly dilated. Wall  thickness was normal. - Right atrium: The atrium was moderately dilated. - Atrial septum: The septum bowed from left to right, consistent  with increased left atrial pressure. - Tricuspid valve: There was moderate regurgitation. - Pulmonary arteries: Systolic pressure was moderately to severely  increased. PA peak pressure: 67 mm Hg (S).  Impressions:  - There was no evidence of a vegetation.  ------------------------------------------------------------------- Labs, prior tests, procedures, and surgery: Coronary artery bypass grafting.  Echocardiography. M-mode, complete 2D, spectral Doppler, and color Doppler. Birthdate: Patient birthdate: 1961/03/12. Age: Patient  is 55 yr old. Sex: Gender: male. BMI: 40.3 kg/m^2. Blood pressure: 130/64 Patient status: Inpatient. Study date: Study date: 01/17/2016. Study time: 03:36 PM.  -------------------------------------------------------------------  ------------------------------------------------------------------- Left ventricle: The cavity size was normal. There was severe concentric hypertrophy. Systolic function was vigorous. The estimated  ejection fraction was in the range of 65% to 70%. Wall motion was normal; there were no regional wall motion abnormalities. The study is not technically sufficient to allow evaluation of LV diastolic function.  ------------------------------------------------------------------- Aortic valve: A mechanical prosthesis was present and functioning normally. Doppler: Transvalvular velocity was within the normal range. There was no stenosis. There was no regurgitation. VTI ratio of LVOT to aortic valve: 0.21. Valve area (VTI): 0.54 cm^2. Indexed valve area (VTI): 0.26 cm^2/m^2. Peak velocity ratio of LVOT to aortic valve: 0.19. Valve area (Vmax): 0.49 cm^2. Indexed valve area (Vmax): 0.23 cm^2/m^2. Mean velocity ratio of LVOT to aortic valve: 0.21. Valve area (Vmean): 0.54 cm^2. Indexed valve area (Vmean): 0.26 cm^2/m^2. Mean gradient (S): 28 mm Hg. (Consistent with moderate aortic stenosis gradient) Peak gradient (S): 48 mm Hg.  ------------------------------------------------------------------- Aorta: Post Bentall with ST Jude Mechanical Valve in 1988. Ascending aorta: The ascending aorta was mildly dilated. Aortic arch: The aortic arch was mildly dilated.  ------------------------------------------------------------------- Mitral valve: A mechanical prosthesis was present and functioning normally. Mobility was not restricted. Doppler: Transvalvular velocity was within the normal range. There was no evidence for stenosis. There was no regurgitation. Valve area by continuity equation (using LVOT flow): 0.99 cm^2. Indexed valve area by continuity equation (using LVOT flow): 0.47 cm^2/m^2. Mean gradient (D): 7 mm Hg. Peak gradient (D): 15 mm Hg.  ------------------------------------------------------------------- Left atrium: The atrium was severely dilated.  ------------------------------------------------------------------- Atrial septum: The septum bowed from  left to right, consistent with increased left atrial pressure.  ------------------------------------------------------------------- Right ventricle: The cavity size was mildly dilated. Wall thickness was normal. Systolic function was normal.  ------------------------------------------------------------------- Pulmonic valve: Poorly visualized. Structurally normal valve. Cusp separation was normal. Doppler: Transvalvular velocity was within the normal range. There was no evidence for stenosis. There was no regurgitation.  ------------------------------------------------------------------- Tricuspid valve: Structurally normal valve. Doppler: Transvalvular velocity was within the normal range. There was moderate regurgitation.  ------------------------------------------------------------------- Pulmonary artery: The main pulmonary artery was normal-sized. Systolic pressure was moderately to severely increased.  ------------------------------------------------------------------- Right atrium: The atrium was moderately dilated.  ------------------------------------------------------------------- Pericardium: There was no pericardial effusion.  ------------------------------------------------------------------- Systemic veins: Inferior vena cava: The vessel was dilated. The respirophasic diameter changes were blunted (< 50%), consistent with elevated central venous pressure.  ------------------------------------------------------------------- Post procedure conclusions Ascending Aorta:  - Post Bentall with ST Jude Mechanical Valve in 1988.  ------------------------------------------------------------------- Measurements  Left ventricle Value Reference LV ID, ED, PLAX chordal (L) 41.5 mm 43 - 52 LV ID, ES, PLAX chordal 25.5 mm 23 - 38 LV fx shortening, PLAX  chordal 39 % >=29 LV PW thickness, ED 20.9 mm --------- IVS/LV PW ratio, ED 0.86 <=1.3 Stroke volume, 2D 37 ml --------- Stroke volume/bsa, 2D 18 ml/m^2 --------- LV ejection fraction, 1-p A4C 52 % --------- LV end-diastolic volume, 2-p 81 ml --------- LV end-systolic volume, 2-p 30 ml --------- LV ejection fraction, 2-p 63 % --------- Stroke volume, 2-p 51 ml --------- LV end-diastolic volume/bsa, 2-p 39 ml/m^2 --------- LV end-systolic volume/bsa, 2-p 14 ml/m^2 --------- Stroke volume/bsa, 2-p 24.4 ml/m^2 ---------  Ventricular septum Value Reference IVS thickness, ED 17.9 mm ---------  LVOT Value Reference LVOT ID, S 18 mm --------- LVOT area 2.54 cm^2 --------- LVOT ID  18 mm --------- LVOT peak velocity, S 66.5 cm/s --------- LVOT mean velocity, S 52.3 cm/s --------- LVOT VTI, S 14.5 cm --------- Stroke volume (SV), LVOT DP 36.9 ml --------- Stroke index (SV/bsa), LVOT DP 17.6 ml/m^2 ---------  Aortic valve Value Reference Aortic valve peak velocity, S 345 cm/s --------- Aortic valve mean velocity, S 248 cm/s --------- Aortic valve VTI, S 67.8 cm --------- Aortic mean gradient,  S 28 mm Hg --------- Aortic peak gradient, S 48 mm Hg --------- VTI ratio, LVOT/AV 0.21 --------- Aortic valve area, VTI 0.54 cm^2 --------- Aortic valve area/bsa, VTI 0.26 cm^2/m^2 --------- Velocity ratio, peak, LVOT/AV 0.19 --------- Aortic valve area, peak velocity 0.49 cm^2 --------- Aortic valve area/bsa, peak 0.23 cm^2/m^2 --------- velocity Velocity ratio, mean, LVOT/AV 0.21 --------- Aortic valve area, mean velocity 0.54 cm^2 --------- Aortic valve area/bsa, mean 0.26 cm^2/m^2 --------- velocity  Aorta Value Reference Aortic arch ID 40 mm ---------  Left atrium Value Reference LA ID, A-P, ES 74 mm --------- LA ID/bsa, A-P (H) 3.54 cm/m^2 <=2.2 LA volume, S 169 ml --------- LA volume/bsa, S 80.8 ml/m^2 --------- LA volume, ES, 1-p A4C 145 ml --------- LA volume/bsa, ES, 1-p A4C 69.3 ml/m^2 --------- LA volume, ES, 1-p A2C 193 ml --------- LA volume/bsa, ES, 1-p A2C 92.2 ml/m^2 ---------  Mitral valve Value Reference Mitral E-wave peak velocity 193 cm/s --------- Mitral mean velocity, D 109 cm/s --------- Mitral deceleration time (L) 132 ms 150 - 230 Mitral mean gradient, D 7 mm Hg --------- Mitral peak gradient, D 15 mm Hg --------- Mitral  valve area, LVOT 0.99 cm^2 --------- continuity Mitral valve area/bsa, LVOT 0.47 cm^2/m^2 --------- continuity Mitral annulus VTI, D 37.1 cm ---------  Pulmonary arteries Value Reference PA pressure, S, DP (H) 67 mm Hg <=30  Tricuspid valve Value Reference Tricuspid regurg peak velocity 361 cm/s --------- Tricuspid peak RV-RA gradient 52 mm Hg ---------  Systemic veins Value Reference Estimated CVP 15 mm Hg ---------  Right ventricle Value Reference RV pressure, S, DP (H) 67 mm Hg <=30  Pulmonic valve Value Reference Pulmonic valve peak velocity, S 102 cm/s ---------  Legend: (L) and (H) mark values outside specified reference range.  ------------------------------------------------------------------- Prepared and Electronically Authenticated by  Candee Furbish, M.D. 2017-05-19T08:27:22  Impression:  Patient appears clinically stable approximately 1 year status post minimally invasive mitral valve replacement using a bileaflet mechanical prosthetic valve. I have personally reviewed the patient's recent follow-up echocardiogram which demonstrates normal left ventricular systolic function and normal functioning mechanical prosthetic valves in the aortic and mitral position. The patient has moderate tricuspid regurgitation and findings consistent with at least moderate pulmonary hypertension. Right ventricular function appears normal and the right ventricle does not appear significantly dilated. The patient does not have distended neck veins on exam to suggest severe right heart failure.   However, the patient continues to experience exertional shortness of breath and fatigue that are likely multifactorial and at least partially related to chronic diastolic and right sided heart failure.   Plan:  I have not recommended any changes to the patient's current medications. I've encouraged the patient to attempt to work more consistently on physical conditioning and rehabilitation. We have discussed dietary implications related to his chronic heart failure, including concerns regarding sodium intake.  The patient has been reminded regarding the importance of dental hygiene and the lifelong need for antibiotic prophylaxis for all dental cleanings and other related invasive procedures.  In the future the patient will call and return to see [place] only should specific  problems or questions arise.   I spent in excess of 15 minutes during the conduct of this office consultation and >50% of this time involved direct face-to-face encounter with the patient for counseling and/or coordination of their care.    Valentina Gu. Roxy Manns, MD 02/18/2016 11:06 AM

## 2016-02-18 NOTE — Patient Instructions (Addendum)
Continue all previous medications without any changes at this time  You may resume normal physical activity without any particular limitations at this time, and return to our office only as needed should any further problems or questions arise.  Endocarditis is a potentially serious infection of heart valves or inside lining of the heart.  It occurs more commonly in patients with diseased heart valves (such as patient's with aortic or mitral valve disease) and in patients who have undergone heart valve repair or replacement.  Certain surgical and dental procedures may put you at risk, such as dental cleaning, other dental procedures, or any surgery involving the respiratory, urinary, gastrointestinal tract, gallbladder or prostate gland.   To minimize your chances for develooping endocarditis, maintain good oral health and seek prompt medical attention for any infections involving the mouth, teeth, gums, skin or urinary tract.    Always notify your doctor or dentist about your underlying heart valve condition before having any invasive procedures. You will need to take antibiotics before certain procedures, including all routine dental cleanings or other dental procedures.  Your cardiologist or dentist should prescribe these antibiotics for you to be taken ahead of time.       

## 2016-02-19 ENCOUNTER — Encounter: Payer: Self-pay | Admitting: *Deleted

## 2016-02-22 ENCOUNTER — Ambulatory Visit: Payer: Self-pay | Admitting: Surgery

## 2016-02-27 ENCOUNTER — Encounter: Payer: Self-pay | Admitting: Internal Medicine

## 2016-02-27 ENCOUNTER — Ambulatory Visit (INDEPENDENT_AMBULATORY_CARE_PROVIDER_SITE_OTHER): Payer: Medicare HMO | Admitting: Internal Medicine

## 2016-02-27 VITALS — BP 118/62 | HR 70 | Ht 61.5 in | Wt 215.0 lb

## 2016-02-27 DIAGNOSIS — Z86010 Personal history of colonic polyps: Secondary | ICD-10-CM

## 2016-02-27 DIAGNOSIS — K529 Noninfective gastroenteritis and colitis, unspecified: Secondary | ICD-10-CM

## 2016-02-27 MED ORDER — NA SULFATE-K SULFATE-MG SULF 17.5-3.13-1.6 GM/177ML PO SOLN: ORAL | Status: DC

## 2016-02-27 NOTE — Progress Notes (Signed)
Subjective:    Patient ID: Lance Cook, male    DOB: Dec 06, 1960, 55 y.o.   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 Fib , and chronic severe lower extremity edema with venous insufficiency who is seen in follow-up to discuss surveillance colonoscopy. He is here today with his wife. A year ago he underwent successful minimally invasive mitral valve replacement with Lance Cook.  He reports that on the whole he feels "okay".   He struggles with fatigue, lower extremity swelling due to lymphedema which is painful for him.  He denies chest pain. He does have exertional dyspnea. He had a recent echocardiogram which showed intact left heart function. He does have tricuspid regurg. He states that he has chronic loose stools occurring 2-4 times daily. They can be urgent. Is particularly worse after sitting for a while and then standing. He denies blood in his stool or melena. He denies abdominal pain.   His last colonoscopy was in May 2013.   This revealed normal terminal ileum. Moderate diverticulosis from the ascending to sigmoid colon. A proximal transverse angiectasia was seen. 3 sessile rectal polyps were removed and found to be tubular adenoma. A possible polyp was seen in the anal canal. This was evaluated by Lance Cook with general surgery and felt to be a skin tag with hemorrhoids.   He did have a colonoscopy in 2010 and had a tubulovillous adenoma removed.   Upper endoscopy was performed on 01/20/2012. This revealed normal esophagus. Mild gastritis which was biopsied. In 3 subcentimeter polyps in the stomach which were biopsied. The examined duodenum was normal. Duodenal  biopsies were normal. Chronic gastritis was found without H. Pylori or dysplasia. Gastric polyps were hyperplastic with focal erosion but no H. Pylori or adenomatous change.  Review of Systems As per HPI otherwise normal  Current Medications, Allergies, Past Medical History, Past Surgical History, Family History and Social History were reviewed in [employer] record.     Objective:   Physical Exam BP 118/62 mmHg  Pulse 70  Ht 5' 1.5" (1.562 m)  Wt 215 lb (97.523 kg)  BMI 39.97 kg/m2 Constitutional: Well-developed and well-nourished. No distress. HEENT: Normocephalic and atraumatic. Oropharynx is clear and moist. No oropharyngeal exudate. Conjunctivae are normal.  No scleral icterus. Neck: Neck supple. Trachea midline. Cardiovascular: Normal rate, regular rhythm and intact distal pulses. No M/R/G Pulmonary/chest: Effort normal and breath sounds normal. No wheezing, rales or rhonchi. Abdominal: Soft, nontender, nondistended. Bowel sounds active throughout. There are no masses palpable. No hepatosplenomegaly. Extremities: no clubbing, cyanosis, or edema Lymphadenopathy: No cervical adenopathy noted. Neurological: Alert and oriented to person place and time. Skin: Skin is warm and dry. No rashes noted. Psychiatric: Normal mood and affect. Behavior is normal.  CBC Latest Ref Rng 02/05/2016 02/12/2015 02/09/2015  WBC 4.0 - 10.5 K/uL 7.6 6.8 9.6  Hemoglobin 13.0 - 17.0 g/dL 8.4(L) 10.6(L) 10.7(L)  Hematocrit 39.0 - 52.0 % 29.7(L) 34.7(L) 36.0(L)  Platelets 150 - 400 K/uL 185 248 270    Iron/TIBC/Ferritin/ %Sat    Component Value Date/Time   IRON 105 03/02/2013 1132   IRON 56 03/22/2012 1026   TIBC 356 03/02/2013 1132   TIBC 391 01/07/2012 1006  FERRITIN 195 03/02/2013 1132   FERRITIN 50 01/07/2012 1006   IRONPCTSAT 29 03/02/2013 1132   IRONPCTSAT 15.0* 03/22/2012 1026   IRONPCTSAT 27 01/07/2012 1006   CMP     Component Value Date/Time   NA 137 02/05/2016  1751   NA 138 12/09/2013 1606   K 4.3 02/05/2016 1751   K 4.3 12/09/2013 1606   CL 102 02/05/2016 1751   CL 104 12/09/2013 1606   CO2 29 02/05/2016 1751   CO2 30 12/09/2013 1606   GLUCOSE 104* 02/05/2016 1751   GLUCOSE 85 12/09/2013 1606   BUN 21* 02/05/2016 1751   BUN 22* 12/09/2013 1606   CREATININE 1.22 02/05/2016 1751   CREATININE 0.83 11/13/2014 1515   CREATININE 0.88 12/09/2013 1606   CALCIUM 8.3* 02/05/2016 1751   CALCIUM 9.2 12/09/2013 1606   PROT 5.0* 02/05/2016 1751   PROT 7.1 12/09/2013 1606   ALBUMIN 2.6* 02/05/2016 1751   ALBUMIN 3.7 12/09/2013 1606   AST 21 02/05/2016 1751   AST 29 12/09/2013 1606   ALT 19 02/05/2016 1751   ALT 26 12/09/2013 1606   ALKPHOS 55 02/05/2016 1751   ALKPHOS 56 12/09/2013 1606   BILITOT 0.7 02/05/2016 1751   BILITOT 0.9 12/09/2013 1606   GFRNONAA >60 02/05/2016 1751   GFRNONAA >60 12/09/2013 1606   GFRAA >60 02/05/2016 1751   GFRAA >60 12/09/2013 1606       Assessment & Plan:  55 year old male with a complicated past medical history including but not limited to adenomatous colon polyps, chronic diarrhea, gastritis , iron deficiency anemia, 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 Fib , and chronic severe lower extremity edema with venous insufficiency who is seen in follow-up to discuss surveillance colonoscopy.  1.  History of adenomatous and tubulovillous adenomatous colon polyps --  He is slightly overdue for surveillance colonoscopy. This was delayed due to his heart surgery, which was totally appropriate. We had a risk versus benefit discussion regarding continuing surveillance colonoscopy in light of his other medical problems. Per his last visit with Lance Cook his heart function is felt to be stable. He has had high risk colon polyps in the past at a young age , making him high risk. After this  discussion including the risks, benefits and alternatives to colonoscopy he wishes to proceed. We will contact Dr. [name] to discuss his warfarin and the need for Lovenox bridge. The procedure will be scheduled in the outpatient hospital setting with monitored anesthesia care given his complex medical history. Will hold warfain 5 days prior to endoscopic procedures - will instruct when and how to resume after procedure. Benefits and risks of procedure explained including risks of bleeding, perforation, infection, missed lesions, reactions to medications and possible need for hospitalization and surgery for complications. Additional rare but real risk of stroke or other vascular clotting events off warfarin also explained and need to seek urgent help if any signs of these problems occur. Will communicate by phone or EMR with patient's  prescribing provider to confirm that holding warfarin is reasonable in this case.   2. Chronic diarrhea --  History I cholestyramine in the past which helps with perianal burning associated with loose stools but not necessarily loose stools. I recommended further testing with IBcause to evaluate further and dictate further treatment as appropriate.  45 minutes spent with the patient today. Greater than 50% was spent in counseling and  coordination of care with the patient

## 2016-02-27 NOTE — Patient Instructions (Addendum)
You have been scheduled for a colonoscopy. Please follow written instructions given to you at your visit today.  Please pick up your prep supplies at the pharmacy within the next 1-3 days. If you use inhalers (even only as needed), please bring them with you on the day of your procedure. Your physician has requested that you go to www.startemmi.com and enter the access code given to you at your visit today. This web site gives a general overview about your procedure. However, you should still follow specific instructions given to you by our office regarding your preparation for the procedure.  Your physician has requested that you have specialized "[facility]" lab work completed. Please take the order form and blood kit sent via your home mail to [facility] located at [address], [place], [place] [address]. Their  hours are 7 a.m.-5 p.m, Monday thru Friday. We will contact you once results have been made available to [place]. If you have not heard from our office within 2 weeks after having lab work completed, please contact us at [phone].  If you are age 52 or older, your body mass index should be between 23-30. Your Body mass index is 39.97 kg/(m^2). If this is out of the aforementioned range listed, please consider follow up with your Primary Care Provider.  If you are age 80 or younger, your body mass index should be between 19-25. Your Body mass index is 39.97 kg/(m^2). If this is out of the aformentioned range listed, please consider follow up with your Primary Care Provider.

## 2016-02-29 ENCOUNTER — Ambulatory Visit (INDEPENDENT_AMBULATORY_CARE_PROVIDER_SITE_OTHER): Payer: Medicare HMO | Admitting: *Deleted

## 2016-02-29 DIAGNOSIS — I482 Chronic atrial fibrillation, unspecified: Secondary | ICD-10-CM

## 2016-02-29 DIAGNOSIS — I481 Persistent atrial fibrillation: Secondary | ICD-10-CM

## 2016-02-29 DIAGNOSIS — Z5181 Encounter for therapeutic drug level monitoring: Secondary | ICD-10-CM | POA: Diagnosis not present

## 2016-02-29 DIAGNOSIS — I4819 Other persistent atrial fibrillation: Secondary | ICD-10-CM

## 2016-02-29 DIAGNOSIS — Z954 Presence of other heart-valve replacement: Secondary | ICD-10-CM | POA: Diagnosis not present

## 2016-02-29 DIAGNOSIS — Z952 Presence of prosthetic heart valve: Secondary | ICD-10-CM

## 2016-02-29 LAB — POCT INR: INR: 3.5

## 2016-03-12 ENCOUNTER — Telehealth: Payer: Self-pay | Admitting: *Deleted

## 2016-03-12 NOTE — Telephone Encounter (Signed)
I have left a message for patient to call back. He still has not had testing which we ordered on 02/27/16.

## 2016-03-12 NOTE — Telephone Encounter (Signed)
-----   Message from Larina Bras, [place] sent at 02/27/2016  2:10 PM EDT ----- Did pt IBCause results come back? [facility]

## 2016-03-14 ENCOUNTER — Telehealth: Payer: Self-pay | Admitting: Internal Medicine

## 2016-03-14 NOTE — Telephone Encounter (Signed)
Lance Cook advised that we did not send fax today but [facility] may have sent it since patient had IBCause blood drawn there yesterday. She states that she will hold on to this paperwork.

## 2016-03-18 ENCOUNTER — Telehealth: Payer: Self-pay | Admitting: Pharmacist

## 2016-03-18 MED ORDER — ENOXAPARIN SODIUM 100 MG/ML ~~LOC~~ SOLN: 100.0000 mg | Freq: Two times a day (BID) | SUBCUTANEOUS | Status: DC

## 2016-03-18 NOTE — Telephone Encounter (Signed)
Spoke with pt.  He has a history of TIA and mechanical mitral valve so will need Lovenox bridge.  We had some issues with insurance coverage last time he required the injections so will send in Rx now to make sure we have time to fix any issues before his colonoscopy.  [facility] appt is 7/21.

## 2016-03-18 NOTE — Telephone Encounter (Signed)
New message      Pt is having a colonoscopy on 04-15-16 by Dr Hilarie Fredrickson.  Pt is calling to see if Dr Hilarie Fredrickson has contacted you about a lovenox bridge. Have you received a clearance?

## 2016-03-20 ENCOUNTER — Telehealth: Payer: Self-pay | Admitting: Internal Medicine

## 2016-03-20 DIAGNOSIS — R195 Other fecal abnormalities: Secondary | ICD-10-CM

## 2016-03-20 NOTE — Telephone Encounter (Signed)
I have contacted patient to advise that Dr Hilarie Fredrickson has received results of IBCause test ([facility]). Bile salt diarrhea testing came back elevated at 21 (normal < 12). Fecal calprotectin 198.6 (normal <50, borderline 50-120, abnormal >120). Clostridium difficile and salmonella have been detected. Dr Hilarie Fredrickson has requested that patient return ASAP for STAT stool culture and C diff PCR for confirmation of this test. Patient verbalizes understanding.

## 2016-03-20 NOTE — Telephone Encounter (Signed)
I have answered additional questions from patient regarding C Diff.

## 2016-03-21 ENCOUNTER — Ambulatory Visit (INDEPENDENT_AMBULATORY_CARE_PROVIDER_SITE_OTHER): Payer: Medicare HMO | Admitting: *Deleted

## 2016-03-21 ENCOUNTER — Other Ambulatory Visit: Payer: Medicare HMO

## 2016-03-21 DIAGNOSIS — I482 Chronic atrial fibrillation, unspecified: Secondary | ICD-10-CM

## 2016-03-21 DIAGNOSIS — Z5181 Encounter for therapeutic drug level monitoring: Secondary | ICD-10-CM | POA: Diagnosis not present

## 2016-03-21 DIAGNOSIS — Z954 Presence of other heart-valve replacement: Secondary | ICD-10-CM | POA: Diagnosis not present

## 2016-03-21 DIAGNOSIS — I481 Persistent atrial fibrillation: Secondary | ICD-10-CM | POA: Diagnosis not present

## 2016-03-21 DIAGNOSIS — Z952 Presence of prosthetic heart valve: Secondary | ICD-10-CM

## 2016-03-21 DIAGNOSIS — I4819 Other persistent atrial fibrillation: Secondary | ICD-10-CM

## 2016-03-21 LAB — POCT INR: INR: 3.2

## 2016-03-21 NOTE — Addendum Note (Signed)
Addended by: Larina Bras on: 03/21/2016 01:42 PM   Modules accepted: Orders

## 2016-03-24 ENCOUNTER — Other Ambulatory Visit: Payer: Medicare HMO

## 2016-03-24 ENCOUNTER — Other Ambulatory Visit: Payer: Self-pay

## 2016-03-24 DIAGNOSIS — R195 Other fecal abnormalities: Secondary | ICD-10-CM

## 2016-03-25 ENCOUNTER — Encounter: Payer: Self-pay | Admitting: Internal Medicine

## 2016-03-25 LAB — CLOSTRIDIUM DIFFICILE BY PCR: CDIFFPCR: DETECTED — AB

## 2016-03-26 ENCOUNTER — Telehealth: Payer: Self-pay | Admitting: *Deleted

## 2016-03-26 MED FILL — metroNIDAZOLE 500 MG TABS: 500 | 10 days supply | Qty: 30 | Fill #0

## 2016-03-26 NOTE — Telephone Encounter (Signed)
-----   Message from Jerene Bears, MD sent at 03/25/2016 10:52 PM EDT ----- c diff positive by 2 tests Begin flagyl 500 mg TID x 10 days followup thereafter, has procedures scheduled for 04/15/2016 at [facility]

## 2016-03-26 NOTE — Telephone Encounter (Signed)
Patient has been advised that he is again positive for C Diff and needs to be treated with Flagyl TID x 10 days. Patient verbalizes understanding. Rx sent to [facility] and patient verbalizes understanding to keep appt 04/15/16.

## 2016-03-28 ENCOUNTER — Telehealth: Payer: Self-pay | Admitting: Pharmacist

## 2016-03-28 ENCOUNTER — Telehealth: Payer: Self-pay | Admitting: *Deleted

## 2016-03-28 ENCOUNTER — Encounter: Payer: Self-pay | Admitting: *Deleted

## 2016-03-28 MED ORDER — SULFAMETHOXAZOLE-TRIMETHOPRIM 800-160 MG PO TABS: 1.0000 | ORAL_TABLET | Freq: Two times a day (BID) | ORAL | 0 refills | Status: AC

## 2016-03-28 NOTE — Telephone Encounter (Signed)
Spoke with pt.  Verified instructions and he has an appt to check his INR on Monday morning.

## 2016-03-28 NOTE — Telephone Encounter (Signed)
Received note from GI that patient has been started on Bactrim and flagyl.  His stool was c. Diff positive and he tested positive for salmonella.  Both of these will increase INR substantially.  LMOM to call us to discuss dose.  Given it is Friday, instructed him to only take 5mg  of Coumadin through the weekend if he is unable to reach [place] before end of day and will follow up on Monday.

## 2016-03-28 NOTE — Telephone Encounter (Signed)
[facility] lab called with a verbal report. Positive stool culture test for salmonella.

## 2016-03-28 NOTE — Telephone Encounter (Signed)
Called patient about PA on Lovenox being approved. He confirmed that [facility] had called and said they were shipping medication to him at no copay.

## 2016-03-28 NOTE — Telephone Encounter (Signed)
See 03/28/16 phone note

## 2016-03-28 NOTE — Telephone Encounter (Signed)
We have received a call from [facility] labs indicating that patient's samonella test has come back positive for a 2nd time. Per Dr Hilarie Fredrickson, patient should take Bactrim DS twice daily x 5 days (patient unable to take azithromycin due to interactions with warfarin and digoxin). Patient has been advised of the above and states that he will get the prescription at [facility] today. He is advised that he should have his coumadin levels rechecked after the Bactrim course as it can interfere with the medication effectiveness. He verbalizes understanding of this.

## 2016-03-31 ENCOUNTER — Ambulatory Visit (INDEPENDENT_AMBULATORY_CARE_PROVIDER_SITE_OTHER): Payer: Medicare HMO | Admitting: Pharmacist

## 2016-03-31 ENCOUNTER — Telehealth: Payer: Self-pay | Admitting: Internal Medicine

## 2016-03-31 DIAGNOSIS — I482 Chronic atrial fibrillation, unspecified: Secondary | ICD-10-CM

## 2016-03-31 DIAGNOSIS — Z952 Presence of prosthetic heart valve: Secondary | ICD-10-CM

## 2016-03-31 DIAGNOSIS — I4819 Other persistent atrial fibrillation: Secondary | ICD-10-CM

## 2016-03-31 DIAGNOSIS — I481 Persistent atrial fibrillation: Secondary | ICD-10-CM

## 2016-03-31 DIAGNOSIS — Z5181 Encounter for therapeutic drug level monitoring: Secondary | ICD-10-CM

## 2016-03-31 DIAGNOSIS — Z954 Presence of other heart-valve replacement: Secondary | ICD-10-CM | POA: Diagnosis not present

## 2016-03-31 LAB — POCT INR: INR: 5

## 2016-03-31 NOTE — Telephone Encounter (Signed)
Unable to afford Suprep. Patient has been placed on sample waiting list. He verbalizes understanding.

## 2016-04-01 ENCOUNTER — Telehealth: Payer: Self-pay | Admitting: Cardiology

## 2016-04-01 NOTE — Telephone Encounter (Signed)
New message        The pt can not afford the Lovenox, cause of the cost   The pt needs the pharmacist Jinny Blossom or sally to call [phone] and ask for Levi Aland  To help get the Lovenox other wise the cost is like $1200.00

## 2016-04-01 NOTE — Telephone Encounter (Signed)
Follow-up     The pt wants to be called when it gets done please

## 2016-04-02 ENCOUNTER — Encounter: Payer: Self-pay | Admitting: Pharmacist

## 2016-04-02 ENCOUNTER — Encounter: Payer: Self-pay | Admitting: Pain Medicine

## 2016-04-02 ENCOUNTER — Encounter (HOSPITAL_COMMUNITY): Payer: Self-pay | Admitting: *Deleted

## 2016-04-02 NOTE — Telephone Encounter (Signed)
F/u Message  Pt call requesting to speak with Gay Filler about previous encounter. Please call back to discuss

## 2016-04-02 NOTE — Telephone Encounter (Signed)
Pt called to report he was having issues with getting Lovenox from the pharmacy.  I spoke with [employer].  Copay is $450.  Pt states he can only get Lovenox and have the colonoscopy if the copay is $0.  Started tier exception request for patient.  Reference number: [id number].  They will fax a decision back within 24-72 hours.   This encounter was created in error - please disregard.

## 2016-04-02 NOTE — Telephone Encounter (Signed)
Spoke with pt.  Told him it would take 24-72 hours before we had an answer from [facility].

## 2016-04-02 NOTE — Telephone Encounter (Signed)
Pt called to report he was having issues with getting Lovenox from the pharmacy.  I spoke with [employer].  Copay is $450.  Pt states he can only get Lovenox and have the colonoscopy if the copay is $0.  Started tier exception request for patient.  Reference number: [id number].  They will fax a decision back within 24-72 hours.

## 2016-04-03 NOTE — Telephone Encounter (Signed)
[facility] for patient that we received approval for tier exception from [facility].  He will need to contact pharmacy to determine the new price.

## 2016-04-07 ENCOUNTER — Telehealth: Payer: Self-pay | Admitting: Internal Medicine

## 2016-04-07 ENCOUNTER — Ambulatory Visit (INDEPENDENT_AMBULATORY_CARE_PROVIDER_SITE_OTHER): Payer: Medicare HMO | Admitting: Pharmacist

## 2016-04-07 DIAGNOSIS — I482 Chronic atrial fibrillation, unspecified: Secondary | ICD-10-CM

## 2016-04-07 DIAGNOSIS — Z954 Presence of other heart-valve replacement: Secondary | ICD-10-CM

## 2016-04-07 DIAGNOSIS — Z5181 Encounter for therapeutic drug level monitoring: Secondary | ICD-10-CM | POA: Diagnosis not present

## 2016-04-07 DIAGNOSIS — Z952 Presence of prosthetic heart valve: Secondary | ICD-10-CM

## 2016-04-07 DIAGNOSIS — I481 Persistent atrial fibrillation: Secondary | ICD-10-CM | POA: Diagnosis not present

## 2016-04-07 DIAGNOSIS — I4819 Other persistent atrial fibrillation: Secondary | ICD-10-CM

## 2016-04-07 LAB — POCT INR: INR: 2

## 2016-04-07 NOTE — Patient Instructions (Signed)
8/9: Last dose of Coumadin  8/10: No coumadin or Lovenox   8/11: Inject Lovenox 100mg  in the fatty abdominal tissue at least 2 inches from the belly button twice a day about 12 hours apart, 8am and 8pm rotate sites. No Coumadin  8/12: Inject Lovenox in the fatty tissue every 12 hours, 8am and 8pm. No Coumadin  8/13: Inject Lovenox in the fatty tissue every 12 hours, 8am and 8pm. No Coumadin  8/14: Inject Lovenox in the fatty tissue in the morning at 8 am (No pm dose). No Coumadin  8/15: Procedure Day - No Lovenox - Resume Coumadin in the evening or as directed by doctor (take an extra half tablet with usual dose for 2 days then resume normal dose)  8/16: Resume Lovenox inject in the fatty tissue every 12 hours and take coumadin   8/17-8/20: Inject Lovenox in the fatty tissue every 12 hours and take coumadin  8/21: Coumadin appt to check INR

## 2016-04-07 NOTE — Telephone Encounter (Signed)
Left voicemail advising patient that prep sample should be available within the next couple of days. I will contact him when they come in.

## 2016-04-07 NOTE — Telephone Encounter (Signed)
Patient indicates that he is now done with Flagyl and Batrium DS for C.Diff and Samonella. Unfortunately, he is still experiencing 6-8 bowel movements daily. They were becoming more formed but are now more watery again. No blood noted in bm's. + rectal pain. I did suggest recticare for the rectal discomfort for now. Dr Hilarie Fredrickson, do you want to give any additional medication? Patient has colonoscopy 04/15/16 at hospital.

## 2016-04-08 ENCOUNTER — Encounter (HOSPITAL_COMMUNITY): Payer: Self-pay | Admitting: *Deleted

## 2016-04-08 ENCOUNTER — Encounter (HOSPITAL_COMMUNITY): Payer: Self-pay | Admitting: Anesthesiology

## 2016-04-08 ENCOUNTER — Telehealth: Payer: Self-pay

## 2016-04-08 NOTE — Telephone Encounter (Signed)
Received call from pre-admission nurse at [facility] and she states that pt has significant cardiac history with surgeries for valve issues. States she spoke with Dr. Oren Bracket and he states that the pt needs antibiotics prior to procedure on the 15th to prevent endocarditis. Note sent to Dr. Hilarie Fredrickson.

## 2016-04-08 NOTE — Progress Notes (Signed)
Consulted Dr. Deatra Canter, Anesthesia about patient's medical history and per Dr. Deatra Canter, patient needs to be treated prophylactically with antibiotics to prevent endocarditis and inform Dr. Hilarie Fredrickson to order this for patient.

## 2016-04-08 NOTE — Progress Notes (Signed)
Patient's Name: Lance Cook  Patient type: Established  MRN: [id number]  Service setting: Ambulatory outpatient  DOB: 09-Sep-1960  Location: [facility]  DOS: 04/09/2016  Primary Care Physician: Lance Dials, PA-C  Note by: Lance Cook. Dossie Arbour, M.D  Referring Physician: [name], PA-C  Specialty: Interventional Pain Management  Last Visit to Pain Management: 01/07/2016   Primary Reason(s) for Visit: Encounter for prescription drug management (Level of risk: moderate) CC: Back Pain (low)   HPI  [name] is a 55 y.o. year old, male patient, who returns today as an established patient. 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; Ascending aortic dissection ([place]); Chronic diastolic congestive heart failure (HCC); 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; Enteritis due to Clostridium difficile; Hypomagnesemia; Physical deconditioning; Chronic pain; Long term current use of opiate analgesic; Long term prescription opiate use; Opiate use (22.5 MME/day); Opiate dependence ([place]); Encounter for therapeutic drug level monitoring; Chronic low back pain (midline); Lumbar facet syndrome; Personal history of transient ischemic attack (TIA), and cerebral infarction without residual deficits; ED (erectile dysfunction) of organic origin; Eunuchoidism; Adult BMI 30+; Obstructive apnea; Testicular hypofunction; Lumbar canal stenosis; Malignant  neoplasm of testis ([place]); Type 2 diabetes mellitus ([place]); Chronic lower extremity pain (Left); Personal history of malignant neoplasm of testis; Musculoskeletal pain; and History of colonic polyps on his problem list.. His primarily concern today is the Back Pain (low)   Pain Assessment: Self-Reported Pain Score: 4              Reported level is compatible with observation       Pain Type: Chronic pain Pain Location: Back Pain Orientation: Lower, Right, Left Pain Descriptors / Indicators: Aching, Stabbing, Jabbing Pain Frequency: Constant  The patient comes into the clinics today for pharmacological management of his chronic pain. I last saw this patient on 01/07/2016. The patient  reports that he does not use drugs. His body mass index is 40.81 kg/m.  Date of Last Visit: 01/07/16 Service Provided on Last Visit: Med Refill  Controlled Substance Pharmacotherapy Assessment & REMS (Risk Evaluation and Mitigation Strategy)  Analgesic: Oxycodone IR 80m q8hrs (15 mg/day of oxycodone) MME/day: 22.5 mg/day Pill Count: Did not bring pills for pill count.  Reminded to bring to all appointments. Pharmacokinetics: Onset of action (Liberation/Absorption): Within expected pharmacological parameters Time to Peak effect (Distribution): Timing and results are as within normal expected parameters Duration of action (Metabolism/Excretion): Within normal limits for medication Pharmacodynamics: Analgesic Effect: More than 50% Activity Facilitation: Medication(s) allow patient to sit, stand, walk, and do the basic ADLs Perceived Effectiveness: Described as relatively effective, allowing for increase in activities of daily living (ADL) Side-effects or Adverse reactions: None reported Monitoring: [place] PMP: Online review of the past 140-montheriod conducted. Compliant with practice rules and regulations Last UDS on record: Tox[employer] Select 13  Date Value Ref Range Status  01/07/2016 FINAL  Final    Comment:     ==================================================================== TOXASSURE SELECT 13 (MW) ==================================================================== Test  Result       Flag       Units Drug Present and Declared for Prescription Verification   Oxazepam                       782          EXPECTED   ng/mg creat   Temazepam                      10809        EXPECTED   ng/mg creat    Oxazepam and temazepam are expected metabolites of diazepam.    Oxazepam is also an expected metabolite of other benzodiazepine    drugs, including chlordiazepoxide, prazepam, clorazepate,    halazepam, and temazepam.  Oxazepam and temazepam are available    as scheduled prescription medications.   Oxycodone                      1568         EXPECTED   ng/mg creat   Oxymorphone                    305          EXPECTED   ng/mg creat   Noroxycodone                   5650         EXPECTED   ng/mg creat    Sources of oxycodone include scheduled prescription medications.    Oxymorphone and noroxycodone are expected metabolites of    oxycodone. Oxymorphone is also available as a scheduled    prescription medication. ==================================================================== Test                      Result    Flag   Units      Ref Range   Creatinine              22               mg/dL      >=20 ==================================================================== Declared Medications:  The flagging and interpretation on this report are based on the  following declared medications.  Unexpected results may arise from  inaccuracies in the declared medications.  **Note: The testing scope of this panel includes these medications:  Oxycodone (Oxy-IR)  Temazepam (Restoril)  **Note: The testing scope of this panel does not include following  reported medications:  Allopurinol (Zyloprim)  Atenolol (Tenormin)  Calcium (Calcium/Magnesium/Zinc)  Clindamycin (Cleocin)   Furosemide (Lasix)  Magnesium (Calcium/Magnesium/Zinc)  Magnesium (Mag-Ox)  Methocarbamol (Robaxin)  Potassium (K-Dur)  Topical  Topical (Silvadene)  Vitamin B  Warfarin (Coumadin)  Zinc (Calcium/Magnesium/Zinc) ==================================================================== For clinical consultation, please call [phone]. ====================================================================    UDS interpretation: Compliant          Medication Assessment Form: Reviewed. Patient indicates being compliant with therapy Treatment compliance: Compliant Risk Assessment: Aberrant Behavior: None observed today Substance Use Disorder (SUD) Risk Level: No change since last visit Risk of opioid abuse or dependence: 0.7-3.0% with doses ? 36 MME/day and 6.1-26% with doses ? 120 MME/day. Opioid Risk Tool (ORT) Score: Total Score: 0 Low Risk for SUD (Score <3) Depression Scale Score: PHQ-2: PHQ-2 Total Score: 0 No depression (0) PHQ-9: PHQ-9 Total Score: 0 No depression (0-4)  Pharmacologic Plan: No change in therapy, at this time  Laboratory Chemistry  Inflammation Markers Lab  Results  Component Value Date   ESRSEDRATE 9 01/14/2016   CRP 0.9 10/09/2015    Renal Function Lab Results  Component Value Date   BUN 21 (H) 02/05/2016   CREATININE 1.22 02/05/2016   GFRAA >60 02/05/2016   GFRNONAA >60 02/05/2016    Hepatic Function Lab Results  Component Value Date   AST 21 02/05/2016   ALT 19 02/05/2016   ALBUMIN 2.6 (L) 02/05/2016    Electrolytes Lab Results  Component Value Date   NA 137 02/05/2016   K 4.3 02/05/2016   CL 102 02/05/2016   CALCIUM 8.3 (L) 02/05/2016   MG 1.8 10/09/2015    Pain Modulating Vitamins Lab Results  Component Value Date   VITAMINB12 371 01/07/2012    Coagulation Parameters Lab Results  Component Value Date   INR 2.0 04/07/2016   LABPROT 39.5 (H) 02/05/2016   APTT 41 (H) 01/24/2015   PLT 185 02/05/2016     Cardiovascular Lab Results  Component Value Date   BNP 216.4 (H) 02/15/2015   HGB 8.4 (L) 02/05/2016   HCT 29.7 (L) 02/05/2016    Note: Lab results reviewed.  Recent Diagnostic Imaging  Dg Chest 2 View  Result Date: 02/05/2016 CLINICAL DATA:  Sepsis.  Low-grade fever. EXAM: CHEST  2 VIEW COMPARISON:  04/30/2015 FINDINGS: Median sternotomy changes and mitral valve prostheses are stable. The cardiac silhouette is significantly enlarged, which has progressed when compared to the prior study. Mediastinal contours appear intact. There is no evidence of focal airspace consolidation, pleural effusion or pneumothorax. There is pulmonary vascular congestion. Osseous structures are without acute abnormality. Soft tissues are grossly normal. IMPRESSION: Enlarged cardiac silhouette, which has progressed when compared to the prior study. Pulmonary vascular congestion. Electronically Signed   By: Fidela Salisbury M.D.   On: 02/05/2016 18:48    Meds  The patient has a current medication list which includes the following prescription(s): acetaminophen, allopurinol, atenolol, b complex vitamins, bacitracin, calcium-magnesium-zinc, cholestyramine, clindamycin, digoxin, enoxaparin, furosemide, furosemide, blood glucose test strips, magnesium oxide, potassium chloride, saccharomyces boulardii, silver sulfadiazine, sulfamethoxazole-trimethoprim, temazepam, vancomycin, warfarin, cephalexin, hydroxyzine, methocarbamol, metronidazole, mupirocin ointment, oxycodone, oxycodone, oxycodone, and suprep bowel prep kit.  Current Outpatient Prescriptions on File Prior to Visit  Medication Sig  . allopurinol (ZYLOPRIM) 300 MG tablet Take 300 mg by mouth daily.  Marland Kitchen atenolol (TENORMIN) 100 MG tablet Take 0.5 tablets (50 mg total) by mouth 2 (two) times daily.  Marland Kitchen b complex vitamins tablet Take 1 tablet by mouth daily.    . bacitracin 500 UNIT/GM ointment Apply topically as needed.  . Calcium-Magnesium-Zinc (CAL-MAG-ZINC  PO) Take 1 tablet by mouth daily.   . cholestyramine (QUESTRAN) 4 GM/DOSE powder Take 1 g by mouth as needed.   . clindamycin (CLEOCIN) 300 MG capsule 300 mg as needed (for dental appointments).   . digoxin (LANOXIN) 0.25 MG tablet TAKE 1 TABLET EVERY DAY  . enoxaparin (LOVENOX) 100 MG/ML injection Inject 1 mL (100 mg total) into the skin every 12 (twelve) hours.  . furosemide (LASIX) 40 MG tablet Take 40 mg daily and may take extra 40 mg if has sob or edema  . magnesium oxide (MAG-OX) 400 (241.3 MG) MG tablet Take 1 tablet (400 mg total) by mouth 2 (two) times daily.  . potassium chloride (K-DUR) 10 MEQ tablet Take 2 tablets (20 mEq total) by mouth 2 (two) times daily.  Marland Kitchen saccharomyces boulardii (FLORASTOR) 250 MG capsule Take 1 capsule (250 mg total) by mouth 2 (two) times daily.  Marland Kitchen  silver sulfADIAZINE (SILVADENE) 1 % cream Apply topically as needed.  . temazepam (RESTORIL) 15 MG capsule Take 15 mg by mouth at bedtime as needed for sleep.  . vancomycin (VANCOCIN) 250 MG capsule Take 1 capsule (250 mg total) by mouth 4 (four) times daily.  Marland Kitchen warfarin (COUMADIN) 5 MG tablet Take 1 tablet (5 mg total) by mouth daily at 6 PM.   No current facility-administered medications on file prior to visit.     ROS  Constitutional: Denies any fever or chills Gastrointestinal: No reported hemesis, hematochezia, vomiting, or acute GI distress Musculoskeletal: Denies any acute onset joint swelling, redness, loss of ROM, or weakness Neurological: No reported episodes of acute onset apraxia, aphasia, dysarthria, agnosia, amnesia, paralysis, loss of coordination, or loss of consciousness  Allergies  Mr. Christianson is allergic to penicillins and adhesive [tape].  [place]  Medical:  Mr. Vandergriff  has a past medical history of Acute respiratory failure with hypoxemia ([place]); Anemia; Anxiety; Aortic aneurysm ([place]); Aortic aneurysm and dissection (HCC); ARDS (adult respiratory distress syndrome) ([place]) (01/27/2015); Arthritis;  Ascending aortic dissection ([place]) (07/14/2008); Asymptomatic chronic venous hypertension (01/15/2010); Atrial fibrillation ([place]); Bell's palsy; CAD (coronary artery disease); Cellulitis (04/02/2014); Cerebral artery occlusion with cerebral infarction [facility]) (10/08/2011); CHF (congestive heart failure) ([place]); Chronic diastolic congestive heart failure ([place]); Chronic diastolic heart failure ([place]) (03/01/2015); Chronic LBP (10/08/2011); CVA (cerebral vascular accident) ([place]) (10/08/2011); Diabetes mellitus; DIABETES MELLITUS, TYPE II (11/01/2009); Diverticulosis; Dysrhythmia; 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); Encephalopathy acute (02/05/2015); Erectile dysfunction (10/08/2011); GERD (gastroesophageal reflux disease); Gout; H/O mechanical aortic valve replacement (11/01/2009); Hiatal hernia; History of adenomatous polyp of colon (12/23/2011); History of colon polyps (12/23/2011); Hyperlipidemia; Hypertension; Hypogonadism male (04/08/2012); Impaired glucose tolerance (10/08/2011); Leg pain (06/28/2010); Morbid obesity ([place]) (04/02/2014); Myocardial infarction [facility]) (age 19); Obesity; OSA (obstructive sleep apnea); Peripheral vascular disease ([place]); Reflux (11/06/09); 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 Family: family history includes Cancer in his maternal aunt and maternal uncle; Diabetes in his father; Heart disease in his mother; Hypertension in his other; Stroke in his other; Throat cancer in his mother. Surgical:  has a past surgical history that includes Laser ablation (03/06/2010); Cardiac valve replacement; Cholecystectomy (2011); Tonsillectomy (1967); Otoplasty; Orchiectomy (age 25); TEE without cardioversion (N/A, 10/11/2014); Bentall  procedure (1988); Coronary artery bypass graft; aortic truck; TEE without cardioversion (N/A, 01/24/2015); Mitral valve replacement (Right, 01/24/2015); and Tooth extraction (2016). Tobacco:  reports that he has been smoking Cigars.  He has never used smokeless tobacco. Alcohol:  reports that he drinks alcohol. Drug:  reports that he does not use drugs.  Constitutional Exam  Vitals: Blood pressure (!) 114/47, pulse 64, temperature 98.6 F (37 C), resp. rate 18, height _0  (1.549 m), weight 216 lb (98 kg), SpO2 99 %. General appearance: Well nourished, well developed, and well hydrated. In no acute distress Calculated BMI/Body habitus: Body mass index is 40.81 kg/m. (>40 kg/m2) Extreme obesity (Class III) - 254% higher incidence of chronic pain. Patient reminded to continue working on losing weight. Psych/Mental status: Alert and oriented x 3 (person, place, & time) Eyes: PERLA Respiratory: No evidence of acute respiratory distress  Cervical Spine Exam  Inspection: No masses, redness, or swelling Alignment: Symmetrical Functional ROM: ROM appears unrestricted Stability: No instability detected Muscle strength & Tone: Functionally intact  Sensory: Unimpaired Palpation: Non-contributory  Upper Extremity (UE) Exam    Side: Right upper extremity  Side: Left upper extremity  Inspection: No masses, redness, swelling, or asymmetry  Inspection: No masses, redness, swelling, or asymmetry  Functional ROM: ROM appears unrestricted  Functional ROM: ROM appears unrestricted  Muscle strength & Tone: Functionally intact  Muscle strength & Tone: Functionally intact  Sensory: Unimpaired  Sensory: Unimpaired  Palpation: Non-contributory  Palpation: Non-contributory   Thoracic Spine Exam  Inspection: No masses, redness, or swelling Alignment: Symmetrical Functional ROM: ROM appears unrestricted Stability: No instability detected Sensory: Unimpaired Muscle strength & Tone: Functionally  intact Palpation: Non-contributory  Lumbar Spine Exam  Inspection: No masses, redness, or swelling Alignment: Symmetrical Functional ROM: ROM appears unrestricted Stability: No instability detected Muscle strength & Tone: Functionally intact Sensory: Unimpaired Palpation: Non-contributory Provocative Tests: Lumbar Hyperextension and rotation test: evaluation deferred today       Patrick's Maneuver: evaluation deferred today              Gait & Posture Assessment  Ambulation: Unassisted Gait: Modified gait pattern (slower gait speed, wider stride width, and longer stance duration) associated with morbid obesity Posture: WNL   Lower Extremity Exam    Side: Right lower extremity  Side: Left lower extremity  Inspection: Venous stasis edema  Inspection: Venous stasis edema  Functional ROM: ROM appears unrestricted  Functional ROM: ROM appears unrestricted  Muscle strength & Tone: Functionally intact  Muscle strength & Tone: Functionally intact  Sensory: Unimpaired  Sensory: Unimpaired  Palpation: Non-contributory  Palpation: Non-contributory    Assessment & Plan  Primary Diagnosis & Pertinent Problem List: The primary encounter diagnosis was Chronic pain. Diagnoses of Long term current use of opiate analgesic, Opiate use (22.5 MME/day), Chronic low back pain (midline), Lumbar spinal stenosis (Severe L4-5), Musculoskeletal pain, and Enteritis due to Clostridium difficile were also pertinent to this visit.  Visit Diagnosis: 1. Chronic pain   2. Long term current use of opiate analgesic   3. Opiate use (22.5 MME/day)   4. Chronic low back pain (midline)   5. Lumbar spinal stenosis (Severe L4-5)   6. Musculoskeletal pain   7. Enteritis due to Clostridium difficile     Problems updated and reviewed during this visit: Problem  Enteritis Due to Clostridium Difficile    Problem-specific Plan(s): No problem-specific Assessment & Plan notes found for this encounter.  No new  Assessment & Plan notes have been filed under this hospital service since the last note was generated. Service: Pain Management   Plan of Care   Problem List Items Addressed This Visit      High   Chronic low back pain (midline) (Chronic)   Relevant Medications   methocarbamol (ROBAXIN) 500 MG tablet   oxyCODONE (OXY IR/ROXICODONE) 5 MG immediate release tablet   oxyCODONE (OXY IR/ROXICODONE) 5 MG immediate release tablet   oxyCODONE (OXY IR/ROXICODONE) 5 MG immediate release tablet   acetaminophen (TYLENOL) 500 MG tablet   Chronic pain - Primary (Chronic)   Relevant Medications   methocarbamol (ROBAXIN) 500 MG tablet   oxyCODONE (OXY IR/ROXICODONE) 5 MG immediate release tablet   oxyCODONE (OXY IR/ROXICODONE) 5 MG immediate release tablet   oxyCODONE (OXY IR/ROXICODONE) 5 MG immediate release tablet   acetaminophen (TYLENOL) 500 MG tablet   Lumbar spinal stenosis (Severe L4-5) (Chronic)   Musculoskeletal pain (Chronic)   Relevant Medications   methocarbamol (ROBAXIN) 500 MG tablet     Medium   Long term current use of opiate  analgesic (Chronic)   Opiate use (22.5 MME/day) (Chronic)     Low   Enteritis due to Clostridium difficile   Relevant Medications   sulfamethoxazole-trimethoprim (BACTRIM DS,SEPTRA DS) 800-160 MG tablet   mupirocin ointment (BACTROBAN) 2 %   metroNIDAZOLE (FLAGYL) 500 MG tablet   cephALEXin (KEFLEX) 500 MG capsule    Other Visit Diagnoses   None.      Pharmacotherapy (Medications Ordered): Meds ordered this encounter  Medications  . methocarbamol (ROBAXIN) 500 MG tablet    Sig: Take 1 tablet (500 mg total) by mouth 3 (three) times daily.    Dispense:  90 tablet    Refill:  2    Do not place this medication, or any other prescription from our practice, on "Automatic Refill". Patient may have prescription filled one day early if pharmacy is closed on scheduled refill date.  Marland Kitchen oxyCODONE (OXY IR/ROXICODONE) 5 MG immediate release tablet    Sig:  Take 1 tablet (5 mg total) by mouth every 8 (eight) hours as needed for severe pain.    Dispense:  90 tablet    Refill:  0    Do not place this medication, or any other prescription from our practice, on "Automatic Refill". Patient may have prescription filled one day early if pharmacy is closed on scheduled refill date. Do not fill until: 04/16/16 To last until: 05/16/16  . oxyCODONE (OXY IR/ROXICODONE) 5 MG immediate release tablet    Sig: Take 1 tablet (5 mg total) by mouth every 8 (eight) hours as needed for severe pain.    Dispense:  90 tablet    Refill:  0    Do not place this medication, or any other prescription from our practice, on "Automatic Refill". Patient may have prescription filled one day early if pharmacy is closed on scheduled refill date. Do not fill until: 05/16/16 To last until: 06/15/16  . oxyCODONE (OXY IR/ROXICODONE) 5 MG immediate release tablet    Sig: Take 1 tablet (5 mg total) by mouth every 8 (eight) hours as needed for severe pain.    Dispense:  90 tablet    Refill:  0    Do not place this medication, or any other prescription from our practice, on "Automatic Refill". Patient may have prescription filled one day early if pharmacy is closed on scheduled refill date. Do not fill until: 06/15/16 To last until: 07/15/16    [facility] & Procedure Ordered: No orders of the defined types were placed in this encounter.   Imaging Ordered: None  Interventional Therapies: Scheduled:  None at this time.    Considering:  Lumbar facet radiofrequency    PRN Procedures:  None at this time.    Referral(s) or Consult(s): None at this time.  New Prescriptions   OXYCODONE (OXY IR/ROXICODONE) 5 MG IMMEDIATE RELEASE TABLET    Take 1 tablet (5 mg total) by mouth every 8 (eight) hours as needed for severe pain.   OXYCODONE (OXY IR/ROXICODONE) 5 MG IMMEDIATE RELEASE TABLET    Take 1 tablet (5 mg total) by mouth every 8 (eight) hours as needed for severe pain.    SACCHAROMYCES BOULARDII (FLORASTOR) 250 MG CAPSULE    Take 1 capsule (250 mg total) by mouth 2 (two) times daily.   VANCOMYCIN (VANCOCIN) 250 MG CAPSULE    Take 1 capsule (250 mg total) by mouth 4 (four) times daily.    Medications administered during this visit: Mr. Dawood had no medications administered during this visit.  Requested PM Follow-up:  Return in 3 months (on 07/10/2016).  Future Appointments Date Time Provider [place]  04/21/2016 9:15 AM [facility]  05/08/2016 10:00 AM [name], MD [facility]  07/09/2016 10:40 AM Milinda Pointer, MD [facility] None    Primary Care Physician: Selinda Orion Location: [facility] Outpatient Pain Management Facility Note by: Lance Cook. Dossie Arbour, M.D, DABA, DABAPM, DABPM, DABIPP, FIPP  Pain Score Disclaimer: We use the NRS-11 scale. This is a self-reported, subjective measurement of pain severity with only modest accuracy. It is used primarily to identify changes within a particular patient. It must be understood that outpatient pain scales are significantly less accurate that those used for research, where they can be applied under ideal controlled circumstances with minimal exposure to variables. In reality, the score is likely to be a combination of pain intensity and pain affect, where pain affect describes the degree of emotional arousal or changes in action readiness caused by the sensory experience of pain. Factors such as social and work situation, setting, emotional state, anxiety levels, expectation, and prior pain experience may influence pain perception and show large inter-individual differences that may also be affected by time variables.  Patient instructions provided during this appointment: There are no Patient Instructions on file for this visit.

## 2016-04-08 NOTE — Telephone Encounter (Signed)
Please start Vancomycin 250 mg po 4 x daily x 14 days, add florastor one po BID x one month... Colonoscopy should be cancelled on the 17 th as he is still being treated for cdiff - reschedule for September

## 2016-04-08 NOTE — Telephone Encounter (Signed)
Left voicemail for patient to call back. His colonoscopy for 04/15/16 has been cancelled for right now until his C Diff is treated.

## 2016-04-09 ENCOUNTER — Ambulatory Visit: Payer: Medicare HMO | Attending: Pain Medicine | Admitting: Pain Medicine

## 2016-04-09 ENCOUNTER — Other Ambulatory Visit: Payer: Self-pay | Admitting: Internal Medicine

## 2016-04-09 VITALS — BP 114/47 | HR 64 | Temp 98.6°F | Resp 18 | Ht 61.0 in | Wt 216.0 lb

## 2016-04-09 DIAGNOSIS — Z79891 Long term (current) use of opiate analgesic: Secondary | ICD-10-CM | POA: Insufficient documentation

## 2016-04-09 DIAGNOSIS — N529 Male erectile dysfunction, unspecified: Secondary | ICD-10-CM | POA: Insufficient documentation

## 2016-04-09 DIAGNOSIS — I252 Old myocardial infarction: Secondary | ICD-10-CM | POA: Insufficient documentation

## 2016-04-09 DIAGNOSIS — Z6841 Body Mass Index (BMI) 40.0 and over, adult: Secondary | ICD-10-CM | POA: Insufficient documentation

## 2016-04-09 DIAGNOSIS — Z8673 Personal history of transient ischemic attack (TIA), and cerebral infarction without residual deficits: Secondary | ICD-10-CM | POA: Diagnosis not present

## 2016-04-09 DIAGNOSIS — I872 Venous insufficiency (chronic) (peripheral): Secondary | ICD-10-CM | POA: Diagnosis not present

## 2016-04-09 DIAGNOSIS — F1190 Opioid use, unspecified, uncomplicated: Secondary | ICD-10-CM | POA: Diagnosis not present

## 2016-04-09 DIAGNOSIS — M545 Low back pain: Secondary | ICD-10-CM

## 2016-04-09 DIAGNOSIS — M109 Gout, unspecified: Secondary | ICD-10-CM | POA: Insufficient documentation

## 2016-04-09 DIAGNOSIS — R609 Edema, unspecified: Secondary | ICD-10-CM | POA: Diagnosis not present

## 2016-04-09 DIAGNOSIS — Z951 Presence of aortocoronary bypass graft: Secondary | ICD-10-CM | POA: Insufficient documentation

## 2016-04-09 DIAGNOSIS — M4806 Spinal stenosis, lumbar region: Secondary | ICD-10-CM | POA: Insufficient documentation

## 2016-04-09 DIAGNOSIS — I110 Hypertensive heart disease with heart failure: Secondary | ICD-10-CM | POA: Insufficient documentation

## 2016-04-09 DIAGNOSIS — I5032 Chronic diastolic (congestive) heart failure: Secondary | ICD-10-CM | POA: Diagnosis not present

## 2016-04-09 DIAGNOSIS — I2510 Atherosclerotic heart disease of native coronary artery without angina pectoris: Secondary | ICD-10-CM | POA: Diagnosis not present

## 2016-04-09 DIAGNOSIS — Z8547 Personal history of malignant neoplasm of testis: Secondary | ICD-10-CM | POA: Diagnosis not present

## 2016-04-09 DIAGNOSIS — Z952 Presence of prosthetic heart valve: Secondary | ICD-10-CM | POA: Insufficient documentation

## 2016-04-09 DIAGNOSIS — I482 Chronic atrial fibrillation: Secondary | ICD-10-CM | POA: Insufficient documentation

## 2016-04-09 DIAGNOSIS — M791 Myalgia: Secondary | ICD-10-CM | POA: Insufficient documentation

## 2016-04-09 DIAGNOSIS — A047 Enterocolitis due to Clostridium difficile: Secondary | ICD-10-CM

## 2016-04-09 DIAGNOSIS — M7918 Myalgia, other site: Secondary | ICD-10-CM

## 2016-04-09 DIAGNOSIS — Z86010 Personal history of colonic polyps: Secondary | ICD-10-CM

## 2016-04-09 DIAGNOSIS — G4733 Obstructive sleep apnea (adult) (pediatric): Secondary | ICD-10-CM | POA: Insufficient documentation

## 2016-04-09 DIAGNOSIS — E291 Testicular hypofunction: Secondary | ICD-10-CM | POA: Diagnosis not present

## 2016-04-09 DIAGNOSIS — E785 Hyperlipidemia, unspecified: Secondary | ICD-10-CM | POA: Insufficient documentation

## 2016-04-09 DIAGNOSIS — K529 Noninfective gastroenteritis and colitis, unspecified: Secondary | ICD-10-CM

## 2016-04-09 DIAGNOSIS — G8929 Other chronic pain: Secondary | ICD-10-CM | POA: Insufficient documentation

## 2016-04-09 DIAGNOSIS — A0472 Enterocolitis due to Clostridium difficile, not specified as recurrent: Secondary | ICD-10-CM

## 2016-04-09 DIAGNOSIS — Z7901 Long term (current) use of anticoagulants: Secondary | ICD-10-CM | POA: Diagnosis not present

## 2016-04-09 DIAGNOSIS — M48061 Spinal stenosis, lumbar region without neurogenic claudication: Secondary | ICD-10-CM

## 2016-04-09 DIAGNOSIS — I719 Aortic aneurysm of unspecified site, without rupture: Secondary | ICD-10-CM | POA: Insufficient documentation

## 2016-04-09 MED ORDER — METHOCARBAMOL 500 MG PO TABS: 500.0000 mg | ORAL_TABLET | Freq: Three times a day (TID) | ORAL | 2 refills | Status: DC

## 2016-04-09 MED ORDER — SACCHAROMYCES BOULARDII 250 MG PO CAPS: 250.0000 mg | ORAL_CAPSULE | Freq: Two times a day (BID) | ORAL | 0 refills | Status: AC

## 2016-04-09 MED ORDER — OXYCODONE HCL 5 MG PO TABS: 5.0000 mg | ORAL_TABLET | Freq: Three times a day (TID) | ORAL | 0 refills | Status: DC | PRN

## 2016-04-09 MED ORDER — VANCOMYCIN HCL 250 MG PO CAPS: 250.0000 mg | ORAL_CAPSULE | Freq: Four times a day (QID) | ORAL | 0 refills | Status: DC

## 2016-04-09 NOTE — Progress Notes (Signed)
Safety precautions to be maintained throughout the outpatient stay will include: orient to surroundings, keep bed in low position, maintain call bell within reach at all times, provide assistance with transfer out of bed and ambulation.  Did not bring pills for pill count.  Reminded to bring to all appointments.  Bilateral lower extremities edematous , dark in color with scabs in place.   Dr Hilarie Fredrickson office called and told patient that he had bacteria in blood and stool.  Patient states he was positive for C diff and salmonella.  Patient was scheduled to have gastric bypass on 04-15-16, but it has been rescheduled due to infections.  Patient states he has been having chronic c diff since he had his heart surgery over a year ago.

## 2016-04-09 NOTE — Telephone Encounter (Signed)
Pts colon being postponed due to cdiff. See additional phone note.

## 2016-04-09 NOTE — Telephone Encounter (Signed)
I normally do not give antibiotics as prophylaxis for GI endoscopy, but I am not opposed to it if cardiology or CT surgery feels this is indicated The selection of and duration of antibiotic needs to be made by the recommending MD and communicated to the patient

## 2016-04-09 NOTE — Telephone Encounter (Signed)
I have advised patient of recommendation to take vancomycin 4 times daily x 14 days, florastor twice daily x 1 month and to hold off on colonoscopy for the time being. Patient verbalizes understanding. Patient has been asked to contact Coumadin clinic to advise of change in plans for colonoscopy (see 04/07/16 cardiology note... Pt to use lovenox bridge). He states that he will do this. Colonoscopy has been rescheduled for 06/17/16 at 8:30 am at this time. Patient verbalizes understanding. New instructions mailed to patient.

## 2016-04-10 ENCOUNTER — Telehealth: Payer: Self-pay | Admitting: Internal Medicine

## 2016-04-10 ENCOUNTER — Telehealth: Payer: Self-pay | Admitting: Cardiology

## 2016-04-10 NOTE — Telephone Encounter (Signed)
Per [facility] pharmacy, patient's vancomycin script would be $251.61. I have contacted Monica at [facility] to ask for her help getting the medication at a more affordable cost. She is going to look into it and contact me at a later time.

## 2016-04-10 NOTE — Telephone Encounter (Signed)
I have left a voicemail for patient to call back. Monica with [facility] is able to make vancomycin for patient for $47.00.

## 2016-04-10 NOTE — Telephone Encounter (Signed)
Patient is advised that I have not received any correspondence from [facility]. He states that they may or may not need information from me as they were able to find the orders that I originally sent to [facility]. He does note though that he has been told the tier exception could take at least 72 hours. He states that he has done nothing today but go from the bedroom to the bathroom. Having nothing but watery bowel movements and states that he feels terrible and feels very dehydrated. I have advised that if things get worse, he may have no choice but to go to the emergency room if for nothing else IV fluids. He verbalizes understanding.

## 2016-04-10 NOTE — Telephone Encounter (Signed)
Returned pt's call. Colonoscopy postponed to 10/17. Advised pt to continue usual Coumadin dose, he will not need Lovenox bridge until October. He already has his Lovenox and will keep this until it's needed. Pt scheduled for Coumadin check on 8/21 for regular INR check.

## 2016-04-10 NOTE — Telephone Encounter (Signed)
Patient returned phone call. °

## 2016-04-10 NOTE — Telephone Encounter (Signed)
New message       Test has been postponed.  He will not start lovenox bridge.  Will his coumadin need to be adjusted?

## 2016-04-10 NOTE — Telephone Encounter (Signed)
Pt called and said [employer] is faxing over paperwork that needs to be filled out concerning getting his medication

## 2016-04-14 ENCOUNTER — Telehealth: Payer: Self-pay | Admitting: Internal Medicine

## 2016-04-14 DIAGNOSIS — A0472 Enterocolitis due to Clostridium difficile, not specified as recurrent: Secondary | ICD-10-CM

## 2016-04-14 NOTE — Telephone Encounter (Signed)
I have spoken to Dr Hilarie Fredrickson who indicates that since patient has presumably failed flagyl, the only other option is vancomycin (with the exception of Dificid which is even more costly than vancomycin). Dr Hilarie Fredrickson states that we can run another C Diff PCR to be sure that we are truly still dealing with C Diff. I have left a voicemail for patient to call back.

## 2016-04-15 ENCOUNTER — Ambulatory Visit (HOSPITAL_COMMUNITY): Admission: RE | Admit: 2016-04-15 | Payer: Medicare HMO | Source: Ambulatory Visit | Admitting: Internal Medicine

## 2016-04-15 SURGERY — COLONOSCOPY WITH PROPOFOL
Anesthesia: Monitor Anesthesia Care

## 2016-04-15 NOTE — Telephone Encounter (Signed)
I have spoken to patient to advise that unfortunately, our best option is vancomycin. However, we can retest for C Diff just to be 100% sure this is what we are dealing with. He would like to do this. Orders placed in EPIC.

## 2016-04-15 NOTE — Telephone Encounter (Signed)
Pt said he is returning your call about his Vancomycin

## 2016-04-17 ENCOUNTER — Other Ambulatory Visit: Payer: Medicare HMO

## 2016-04-17 ENCOUNTER — Telehealth: Payer: Self-pay | Admitting: Internal Medicine

## 2016-04-17 DIAGNOSIS — A0472 Enterocolitis due to Clostridium difficile, not specified as recurrent: Secondary | ICD-10-CM

## 2016-04-17 NOTE — Telephone Encounter (Signed)
As per last note, we send vancomycin based on C Diff results. We do not carry samples of vancomycin or I would have given him some previously.

## 2016-04-18 LAB — CLOSTRIDIUM DIFFICILE BY PCR: Toxigenic C. Difficile by PCR: DETECTED — CR

## 2016-04-21 ENCOUNTER — Telehealth: Payer: Self-pay | Admitting: Internal Medicine

## 2016-04-21 MED ORDER — VANCOMYCIN 50 MG/ML ORAL SOLUTION: 250.0000 mg | Freq: Four times a day (QID) | ORAL | 0 refills | Status: AC

## 2016-04-21 NOTE — Telephone Encounter (Signed)
Patient advised that C Diff is positive. I have advised that the only options at this point are vancomycin or Dificid (which can be very expensive). I will go ahead and send rx to [facility] and will also fill out Dificid papers to see if we can get that quicker. Patient verbalizes understanding.

## 2016-04-24 ENCOUNTER — Telehealth: Payer: Self-pay | Admitting: Internal Medicine

## 2016-04-24 ENCOUNTER — Encounter: Payer: Self-pay | Admitting: Cardiology

## 2016-04-24 MED FILL — VANCOMYCIN 50MG/ML ORAL SOL: 50MG/1ML | 10 days supply | Qty: 200 | Fill #0

## 2016-04-24 NOTE — Telephone Encounter (Signed)
Noted  

## 2016-04-30 ENCOUNTER — Telehealth: Payer: Self-pay | Admitting: Internal Medicine

## 2016-04-30 ENCOUNTER — Other Ambulatory Visit: Payer: Self-pay | Admitting: Cardiology

## 2016-04-30 DIAGNOSIS — A0471 Enterocolitis due to Clostridium difficile, recurrent: Secondary | ICD-10-CM

## 2016-04-30 LAB — STOOL CULTURE

## 2016-04-30 NOTE — Telephone Encounter (Signed)
I recommend ID consultation given his c diff and hx of salmonella Continue vanco PO QID for now x 14 days total Will need colonoscopy once c diff is treated to resolution

## 2016-04-30 NOTE — Telephone Encounter (Signed)
Patient states that he started vancomycin on Thursday (had 3 doses on that day) and currently has about 1/4 bottle left. However, he states that he is still having 10+ liquid stools daily. When he takes cholestyramine four times daily, his bowel movements decrease to 3-4 times daily. When asked if he got florastor, he states he was never able to afford it. He is inquiring about fecal transplant. Any other suggestions or would you like him to finish out vanco first?

## 2016-05-01 NOTE — Telephone Encounter (Signed)
Referral has been placed for ID. Per Sharyn Lull, scheduling coordinator is out of office this week. However, request for appointment is in the work que and should be scheduled next week. Currently, first available new patient appointment is around the end of September. Dr Hilarie Fredrickson, do you think patient will need vanco taper if he needs to wait until end of September for ID appointment?

## 2016-05-01 NOTE — Telephone Encounter (Signed)
Left voicemail for patient to call back. 

## 2016-05-01 NOTE — Telephone Encounter (Signed)
Complete the 10 days of vancomycin and continue cholestyramine Repeat C. difficile PCR 1-2 days after completing vancomycin therapy given ongoing diarrhea. If still present would need a prolonged course and then taper. ID appointment pending

## 2016-05-02 NOTE — Telephone Encounter (Signed)
Patient advised to continue vancomycin course until complete and continue cholesytramine for now. He is to have repeat testing 1-2 days after completion of vancomycin and if still positive will resume vancomycin for longer course. I also advised of appointment pending with ID. Patient verbalizes understanding.
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
## 2016-05-08 ENCOUNTER — Ambulatory Visit (INDEPENDENT_AMBULATORY_CARE_PROVIDER_SITE_OTHER): Payer: Medicare HMO | Admitting: Cardiology

## 2016-05-08 ENCOUNTER — Ambulatory Visit (INDEPENDENT_AMBULATORY_CARE_PROVIDER_SITE_OTHER): Payer: Medicare HMO | Admitting: Pharmacist

## 2016-05-08 ENCOUNTER — Encounter: Payer: Self-pay | Admitting: Cardiology

## 2016-05-08 VITALS — BP 126/58 | HR 82 | Ht 61.0 in | Wt 230.2 lb

## 2016-05-08 DIAGNOSIS — Z79899 Other long term (current) drug therapy: Secondary | ICD-10-CM

## 2016-05-08 DIAGNOSIS — I482 Chronic atrial fibrillation, unspecified: Secondary | ICD-10-CM

## 2016-05-08 DIAGNOSIS — Z954 Presence of other heart-valve replacement: Secondary | ICD-10-CM

## 2016-05-08 DIAGNOSIS — I4891 Unspecified atrial fibrillation: Secondary | ICD-10-CM | POA: Diagnosis not present

## 2016-05-08 DIAGNOSIS — R0602 Shortness of breath: Secondary | ICD-10-CM

## 2016-05-08 DIAGNOSIS — M7989 Other specified soft tissue disorders: Secondary | ICD-10-CM

## 2016-05-08 DIAGNOSIS — Z952 Presence of prosthetic heart valve: Secondary | ICD-10-CM

## 2016-05-08 DIAGNOSIS — Z5181 Encounter for therapeutic drug level monitoring: Secondary | ICD-10-CM

## 2016-05-08 LAB — POCT INR: INR: 3.3

## 2016-05-08 LAB — BASIC METABOLIC PANEL
BUN: 23 mg/dL (ref 7–25)
CALCIUM: 7.7 mg/dL — AB (ref 8.6–10.3)
CO2: 27 mmol/L (ref 20–31)
CREATININE: 0.83 mg/dL (ref 0.70–1.33)
Chloride: 103 mmol/L (ref 98–110)
GLUCOSE: 73 mg/dL (ref 65–99)
Potassium: 4 mmol/L (ref 3.5–5.3)
Sodium: 137 mmol/L (ref 135–146)

## 2016-05-08 NOTE — Patient Instructions (Signed)
Medication Instructions:  Continue current medications  Labwork: BMP and BNP  Testing/Procedures: None Ordered  Follow-Up: Your physician wants you to follow-up in: 6 Months. You will receive a reminder letter in the mail two months in advance. If you don't receive a letter, please call our office to schedule the follow-up appointment.   Any Other Special Instructions Will Be Listed Below (If Applicable).   If you need a refill on your cardiac medications before your next appointment, please call your pharmacy.

## 2016-05-09 LAB — BRAIN NATRIURETIC PEPTIDE: Brain Natriuretic Peptide: 159.1 pg/mL — ABNORMAL HIGH (ref <100)

## 2016-05-22 ENCOUNTER — Telehealth: Payer: Self-pay | Admitting: Internal Medicine

## 2016-05-22 DIAGNOSIS — Z86010 Personal history of colonic polyps: Secondary | ICD-10-CM

## 2016-05-22 DIAGNOSIS — K529 Noninfective gastroenteritis and colitis, unspecified: Secondary | ICD-10-CM

## 2016-05-22 MED ORDER — HYDROCORTISONE ACE-PRAMOXINE 2.5-1 % RE CREA: 1.0000 "application " | TOPICAL_CREAM | Freq: Three times a day (TID) | RECTAL | 0 refills | Status: DC | PRN

## 2016-05-22 MED ORDER — CHOLESTYRAMINE 4 GM/DOSE PO POWD: 4.0000 g | ORAL | 0 refills | Status: DC | PRN

## 2016-05-22 NOTE — Telephone Encounter (Signed)
Patient states that he plans on completing C Diff testing (as requested weeks ago) on Monday. He also requests cholecystramine and analpram, both of which were last filled per EPIC in 2013 by different physicians. He is NOT requesting polyethelyne glycol or bactroban ointment as the original message indicates. Dr Hilarie Fredrickson, please advise.Marland KitchenMarland KitchenMarland Kitchen

## 2016-05-22 NOTE — Telephone Encounter (Signed)
Rxs sent

## 2016-05-22 NOTE — Telephone Encounter (Signed)
[place] for those 2 refills I do think he needs to return c diff PCR stool test as requested

## 2016-05-23 IMAGING — CR DG CHEST 1V PORT
1 series · 1 of 1 positions shown · non-contrast
Comparison: 01/17/2014.

CLINICAL DATA: Fever.  Leg pain.  Back pain.

EXAM:
PORTABLE CHEST - 1 VIEW

[AP]
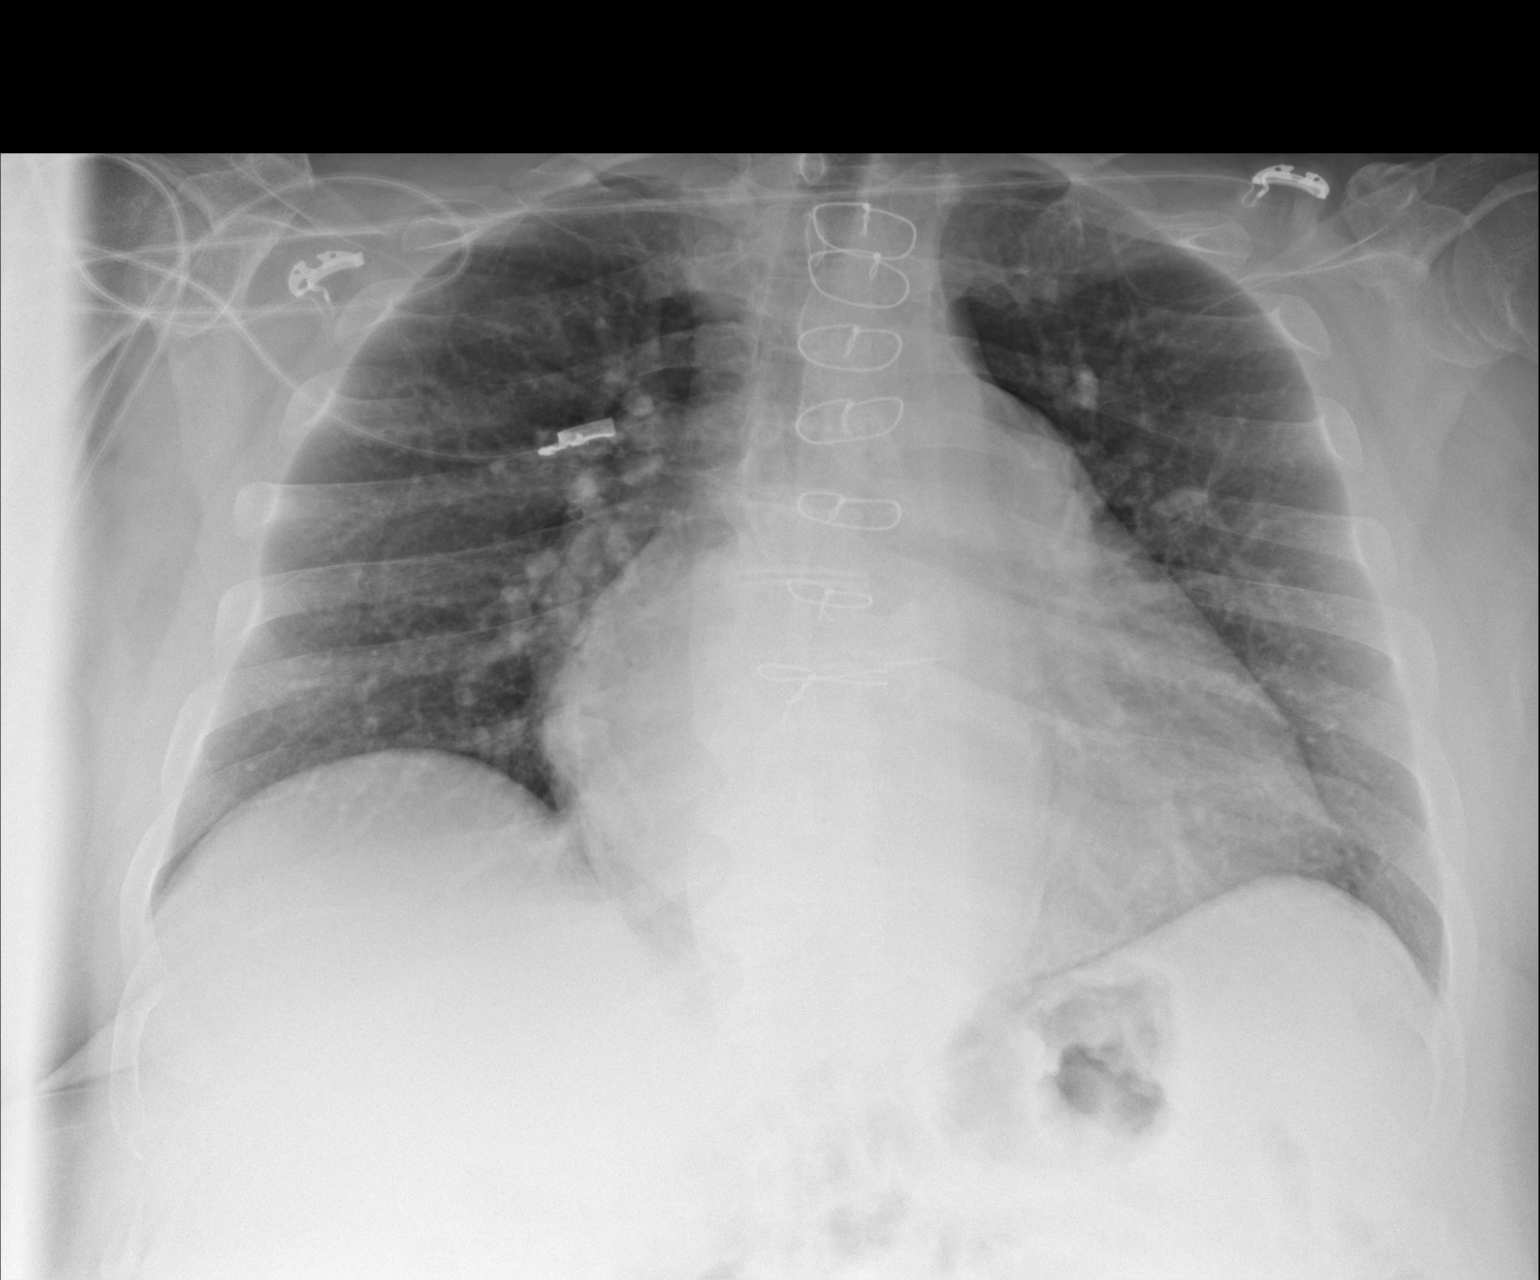

[1 of 1 positions shown; findings below may reference images not displayed]

FINDINGS: Cardiomegaly. Median sternotomy. Apical lordotic projection.
Pulmonary vascular congestion. No airspace disease. No pleural
effusion. Monitoring leads project over the chest.
IMPRESSION: Cardiomegaly and pulmonary vascular congestion.

## 2016-05-29 ENCOUNTER — Telehealth: Payer: Self-pay | Admitting: Internal Medicine

## 2016-05-29 ENCOUNTER — Other Ambulatory Visit: Payer: Self-pay

## 2016-05-29 ENCOUNTER — Other Ambulatory Visit: Payer: Medicare HMO

## 2016-05-29 DIAGNOSIS — A0471 Enterocolitis due to Clostridium difficile, recurrent: Secondary | ICD-10-CM

## 2016-05-29 DIAGNOSIS — R197 Diarrhea, unspecified: Secondary | ICD-10-CM

## 2016-05-29 NOTE — Telephone Encounter (Signed)
Looks like call has already been taken care of.

## 2016-05-29 NOTE — Telephone Encounter (Signed)
Received call from lab, pt wants stool tested for salmonella also. Order added.

## 2016-05-29 NOTE — Telephone Encounter (Signed)
Spoke with the patient and his wife. I relayed that test had been added on per notes from Rosanne Sack, RN

## 2016-05-29 NOTE — Telephone Encounter (Signed)
Spouse upset and requesting to speak to manager over [place]. Upset that Dottie didn't come to the phone when they called the first time because she was in a room with a patient. And now she is at lunch. Says that it is a waste of time & money to come twice. Wanted to get screened for both CDiff  & Salmonella at the same time.

## 2016-05-30 ENCOUNTER — Other Ambulatory Visit: Payer: Self-pay

## 2016-05-30 DIAGNOSIS — A0472 Enterocolitis due to Clostridium difficile, not specified as recurrent: Secondary | ICD-10-CM

## 2016-05-30 DIAGNOSIS — N5089 Other specified disorders of the male genital organs: Secondary | ICD-10-CM | POA: Insufficient documentation

## 2016-05-30 LAB — CLOSTRIDIUM DIFFICILE BY PCR: CDIFFPCR: DETECTED — AB

## 2016-05-30 MED ORDER — VANCOMYCIN HCL 125 MG PO CAPS: ORAL_CAPSULE | ORAL | 0 refills | Status: DC

## 2016-05-30 MED FILL — VANCOMYCIN 50MG/ML ORAL SOL: 14 days supply | Qty: 140 | Fill #0

## 2016-06-03 ENCOUNTER — Other Ambulatory Visit: Payer: Self-pay | Admitting: Internal Medicine

## 2016-06-03 DIAGNOSIS — R197 Diarrhea, unspecified: Secondary | ICD-10-CM

## 2016-06-05 ENCOUNTER — Ambulatory Visit (INDEPENDENT_AMBULATORY_CARE_PROVIDER_SITE_OTHER): Payer: Medicare HMO | Admitting: *Deleted

## 2016-06-05 DIAGNOSIS — I4819 Other persistent atrial fibrillation: Secondary | ICD-10-CM

## 2016-06-05 DIAGNOSIS — I481 Persistent atrial fibrillation: Secondary | ICD-10-CM

## 2016-06-05 DIAGNOSIS — I482 Chronic atrial fibrillation, unspecified: Secondary | ICD-10-CM

## 2016-06-05 DIAGNOSIS — Z952 Presence of prosthetic heart valve: Secondary | ICD-10-CM

## 2016-06-05 DIAGNOSIS — Z5181 Encounter for therapeutic drug level monitoring: Secondary | ICD-10-CM | POA: Diagnosis not present

## 2016-06-05 LAB — POCT INR: INR: 4

## 2016-06-05 NOTE — Patient Instructions (Signed)
06/11/16: Last dose of Coumadin.  06/12/16: No Coumadin or Lovenox.  06/13/16: Inject Lovenox 100 mg in the fatty abdominal tissue at least 2 inches from the belly button twice a day about 12 hours apart, 8am and 8pm rotate sites. No Coumadin.  06/14/16: Inject Lovenox 100mg  in the fatty tissue every 12 hours, 8am and 8pm. No Coumadin.  06/15/16: Inject Lovenox 100mg   in the fatty tissue every 12 hours, 8am and 8pm. No Coumadin.  06/16/16: Inject Lovenox 100mg  in the fatty tissue in the morning at 8 am (No PM dose). No Coumadin.  06/17/16: Procedure Day - No Lovenox - Resume Coumadin in the evening or as directed by doctor (take an extra half tablet with usual dose for 2 days then resume normal dose).  06/18/16: Resume Lovenox 100mg  inject in the fatty tissue every 12 hours and take Coumadin.  06/19/16: Inject Lovenox 100mg  in the fatty tissue every 12 hours and take Coumadin.  06/20/16: Inject Lovenox 100mg   in the fatty tissue every 12 hours and take Coumadin.  06/21/16: Inject Lovenox 100mg  in the fatty tissue every 12 hours and take Coumadin.  06/22/16: Inject Lovenox 100mg  in the fatty tissue every 12 hours and take Coumadin.  06/23/16: Coumadin appt to check INR.

## 2016-06-09 ENCOUNTER — Ambulatory Visit: Payer: Medicare HMO | Admitting: Physical Therapy

## 2016-06-09 ENCOUNTER — Telehealth: Payer: Self-pay | Admitting: Internal Medicine

## 2016-06-09 NOTE — Telephone Encounter (Signed)
Spoke with pt and he is aware. 

## 2016-06-09 NOTE — Telephone Encounter (Signed)
Pyrtle pt with recurrent Cdiff. Pt is scheduled for colon at [facility] 06/17/16. Pt was started back on vacomycin for a longer taper on 05/30/16. Pt is supposed to start his lovenox this evening for his bridge and wants to know if he needs to be tested for cdiff prior to colon or if it needs to be rescheduled. Dr. Silverio Decamp as doc of the day please advise.

## 2016-06-09 NOTE — Telephone Encounter (Signed)
He can proceed with colonoscopy on 10/17 given he would've completed greater than 2 weeks of vancomycin for C. Difficile. If his symptoms are improving repeat testing will not be needed and C. difficile antigen can linger for a longer time than the toxin

## 2016-06-10 ENCOUNTER — Ambulatory Visit: Payer: Medicare HMO | Attending: Vascular Surgery | Admitting: Physical Therapy

## 2016-06-10 ENCOUNTER — Encounter: Payer: Self-pay | Admitting: Physical Therapy

## 2016-06-10 DIAGNOSIS — I890 Lymphedema, not elsewhere classified: Secondary | ICD-10-CM | POA: Diagnosis present

## 2016-06-10 NOTE — Therapy (Addendum)
[place], [place], [address] Phone: [phone]   Fax:  [phone]  Physical Therapy Evaluation  Patient Details  Name: Lance Cook MRN: [id number] Date of Birth: [birth year] Referring Provider: Anselm Pancoast  Encounter Date: 06/10/2016      PT End of Session - 06/10/16 1402    Visit Number 1   Number of Visits 25   Date for PT Re-Evaluation 08/05/16   PT Start Time 0947   PT Stop Time 1040   PT Time Calculation (min) 53 min   Activity Tolerance Patient tolerated treatment well   Behavior During Therapy [facility] for tasks assessed/performed      Past Medical History:  Diagnosis Date  . Acute respiratory failure with hypoxemia ([place])   . Anemia   . Anxiety   . Aortic aneurysm ([place])   . Aortic aneurysm and dissection ([place])   . ARDS (adult respiratory distress syndrome) ([place]) 01/27/2015  . Arthritis   . Ascending aortic dissection ([place]) 07/14/2008   Localized dissection of ascending aorta noted on CTA in 2009 and stable on CTA in 2011  . Asymptomatic chronic venous hypertension 01/15/2010   Overview:  Overview:  Qualifier: Diagnosis of  By: [name] MD, [name]   Last Assessment & Plan:  I suggested he go to a vein clinic to see if he could be qualified compression hose of some type, since he is not a surgical candidate according to the vascular surgeons.   . Atrial fibrillation ([name])    chronic persistent  . Bell's palsy   . CAD (coronary artery disease)    Old scar inferior wall myoview, 10/2009 EF 52%.  He did have previous SVG to RCA but no obstructive disease noted on his most recent cath.  SVG occluded.   . Cellulitis 04/02/2014  . Cerebral artery occlusion with cerebral infarction ([place]) 10/08/2011   Overview:  Overview:  And hx of TIA prior to CABG, all thought due to systemic emboli prior to coumadin   . CHF (congestive heart failure) ([place])   . Chronic diastolic congestive heart failure ([place])   .  Chronic diastolic heart failure ([place]) 03/01/2015  . Chronic LBP 10/08/2011  . CVA (cerebral vascular accident) ([place]) 10/08/2011   And hx of TIA prior to CABG, all thought due to systemic emboli prior to coumadin   . Diabetes mellitus    diet controlled  . DIABETES MELLITUS, TYPE II 11/01/2009   Qualifier: Diagnosis of  By: [name] MD, [name]    . Diverticulosis   . Dysrhythmia   . 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 Encephalopathy acute 02/05/2015  . Erectile dysfunction 10/08/2011  . GERD (gastroesophageal reflux disease)    not needing medication at thhis time- 01/22/15  . Gout   . H/O mechanical aortic valve replacement 11/01/2009   Qualifier: Diagnosis of  By: [name], [name]    . Hiatal hernia   . History of adenomatous polyp of colon 12/23/2011   Colonoscopy January 2010, 6 mm rectal tubulovillous adenoma. No high-grade dysplasia   . History of colon polyps 12/23/2011   Overview:  Overview:  Colonoscopy January 2010, 6 mm rectal tubulovillous adenoma. No high-grade dysplasia   . Hyperlipidemia   . Hypertension   . Hypogonadism male 04/08/2012  . Impaired glucose tolerance 10/08/2011  . Leg pain 06/28/2010  . Morbid obesity ([place])  04/02/2014  . Myocardial infarction age 55  . Obesity   . OSA (obstructive sleep apnea)    CPAP  . Peripheral vascular disease ([place])   . Reflux 11/06/09  . 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 0/45/4098   33 mm St Jude bileaflet mechanical valve placed via right mini thoracotomy approach  . 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    1988 - Dr Blase Mess at [facility] in [place], [place]  . Severe mitral regurgitation 10/11/2014  . Shortness of breath dyspnea    with exertion  . Stroke [facility])    TIA and Stroke no residual effect- 1st was 55  .  Testicular cancer ([place])   . TIA (transient ischemic attack)    age 55  . Varicose veins     Past Surgical History:  Procedure Laterality Date  . aortic truck    . BENTALL PROCEDURE  1988   25 mm St Jude mechanical valve conduit - Dr Blase Mess at [facility] in [place], [place]  . CHOLECYSTECTOMY  2011  . CORONARY ARTERY BYPASS GRAFT    . LASER ABLATION  03/06/2010   left leg  . MITRAL VALVE REPLACEMENT Right 01/24/2015   Procedure: Re-Operation, MINIMALLY INVASIVE MITRAL VALVE (MV) REPLACEMENT.;  Surgeon: Rexene Alberts, MD;  Location: [place];  Service: Open Heart Surgery;  Laterality: Right;  . ORCHIECTOMY  age 55   testicular cancer  . OTOPLASTY     bilateral, age 55  . TEE WITHOUT CARDIOVERSION N/A 10/11/2014   Procedure: TRANSESOPHAGEAL ECHOCARDIOGRAM (TEE);  Surgeon: Pixie Casino, MD;  Location: [facility] ENDOSCOPY;  Service: Cardiovascular;  Laterality: N/A;  . TEE WITHOUT CARDIOVERSION N/A 01/24/2015   Procedure: TRANSESOPHAGEAL ECHOCARDIOGRAM (TEE);  Surgeon: Rexene Alberts, MD;  Location: [place];  Service: Open Heart Surgery;  Laterality: N/A;  . TONSILLECTOMY  1967  . TOOTH EXTRACTION  2016    There were no vitals filed for this visit.       Subjective Assessment - 06/10/16 0952    Subjective I had testicular cancer at age 55 and I received radiation therapy. After that I spent 30 years on my feet all day beating up my lymphatic system further. On top of everything else this is hereditary my grandmother also has CHF and swelling of the feet. My legs are swollen and the right one goes up the thigh. My genitals are very swollen too. After going through sclera therapy I started noticing a lot of swelling. I have also had ablation of the saphrenous on one side and on the other side it was harvested for aortic surgery. I have a FlexiTouch that I got about a year and a half ago. Patient states he does not use it as often as he should.  Patient states he has CircAid juxta lites but he has not been wearing it today. I can tell you I have not been able to tolerate the wraps before - it was cotton and coban and I could not take it.    How long can you walk comfortably? pt is out of breath in 10 steps per patient   Patient Stated Goals to get more mobile    Currently in Pain? No/denies  discomfort            [facility] PT Assessment - 06/10/16 0001      Assessment   Medical Diagnosis  bilateral LE lymphedema  genital swelling for 2.5 wks   Referring Provider Vida Roller Sluss   Onset Date/Surgical Date 09/01/12   Hand Dominance Right   Prior Therapy none     Precautions   Precautions Other (comment)  lymphedema, c-diff     Restrictions   Weight Bearing Restrictions No     Balance Screen   Has the patient fallen in the past 6 months No   Has the patient had a decrease in activity level because of a fear of falling?  Yes   Is the patient reluctant to leave their home because of a fear of falling?  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 her and assists her around the house   Leisure pt reports he does not exercise but is busy with errands     Cognition   Overall Cognitive Status Within Functional Limits for tasks assessed     Observation/Other Assessments   Skin Integrity fibrotic texture to bilateral LE, pt reports leathery texture to genitalia,   Other Surveys  --  LLIS: 87% impairment           LYMPHEDEMA/ONCOLOGY QUESTIONNAIRE - 06/10/16 1014       Type   Cancer Type left testicular cancer  when pt was 11     Treatment   Past Radiation Treatment Yes   Body Site --  for testicular cancer when patient was 11     What other symptoms do you have   Are you Having Heaviness or Tightness Yes  pt reports genitals swell to the point of large lemons   Are you having Pain Yes   Do you have infections Yes   Comments hospitilized for cellulitis in 2016     Lymphedema Assessments   Lymphedema Assessments Lower extremities     Right Lower Extremity Lymphedema   20 cm Proximal to Suprapatella 77.5 cm   10 cm Proximal to Suprapatella 72.1 cm   At Midpatella/Popliteal Crease 53.2 cm   30 cm Proximal to Floor at Lateral Plantar Foot 52.7 cm   20 cm Proximal to Floor at Lateral Plantar Foot 40 1   10 cm Proximal to Floor at Lateral Malleoli 30 cm   5 cm Proximal to 1st MTP Joint 27.7 cm   Across MTP Joint 25.7 cm   Around Proximal Great Toe 8.1 cm     Left Lower Extremity Lymphedema   20 cm Proximal to Suprapatella 78.1 cm   10 cm Proximal to Suprapatella 71 cm   At Midpatella/Popliteal Crease 56.8 cm   30 cm Proximal to Floor at Lateral Plantar Foot 56 cm   20 cm Proximal to Floor at Lateral Plantar Foot 43.5 cm   10 cm Proximal to Floor at Lateral Malleoli 32.5 cm   5 cm Proximal to 1st MTP Joint 27.8 cm   Across MTP Joint 25.8 cm   Around Proximal Great Toe 8.4 cm  [place] Adult PT Treatment/Exercise - 06/10/16 0001      Manual Therapy   Manual Therapy Edema management   Manual therapy comments how to obtain commercially available compression underwear/biking shorts for management of edema                PT Education - 06/10/16 1359    Education provided Yes   Education Details anatomy and physiology of lymphatic system, how to obtain commercially available compression shorts, importance of compression garments   Person(s) Educated Patient   Methods Explanation   Comprehension Verbalized  understanding           Short Term Clinic Goals - 06/10/16 1414      CC Short Term Goal  #1   Title Patient to demonstrate a 2 cm decrease of edema 10 cm proximal to  left lateral malleoli   Baseline 32.5   Time 4   Period Weeks   Status New     CC Short Term Goal  #2   Title Pt to demonstrate 2 cm decrease of edema at left popliteal crease to improve mobility   Baseline 56 cm   Time 4   Period Weeks   Status New     CC Short Term Goal  #3   Title Pt to report a 25% improvement in genital swelling to decrease discomfort   Time 4   Period Weeks   Status New             Long Term Clinic Goals - 06/10/16 1416      CC Long Term Goal  #1   Title Pt to receive appropritate compression garments for long term management of edema   Time 8   Period Weeks   Status New     CC Long Term Goal  #2   Title Pt to demonstrate a 4 cm decrease in edema 20 cm proximal to suprapatella on right to allow for improved mobility   Baseline 77.5   Time 8   Period Weeks   Status New     CC Long Term Goal  #3   Title Pt to report a 75% decrease in genital swelling to decrease risk of cellulitis   Time 8   Period Weeks   Status New            Plan - 06/10/16 1402    Clinical Impression Statement Pt presents to PT with bilateral LE lymphedema and groin swelling. Patient has a history of CHF, diabetes and venous insufficiency. He also has a history of testicular cancer and underwent radiation for this when he was 69. He reports in the past he has been unable to tolerate compression garments and compression wraps though he has never had formal lymphedema therapy. He is recovering from c-dff currently. Both legs are very fibrotic and scaly from foot to knee. Right leg is more swollen from knee to groin. Pt reports he has genital swelling and describes it as "large lemons." Pt was educated about importance of compression and how to obtain compression underwear and biker shorts to help  manage his genital edema. He has a compression pump and a foot to knee compression garment but has not been compliant with consistent use. Pt was educated on importance of committing to a bandaging schedule and 24/7 compression schedule. Pt would benefit from skilled PT services for management of bilateral LE lymphedema.    Rehab Potential Fair   Clinical Impairments Affecting Rehab Potential pt has financial concerns  about cost of therapy and cost of compression garments, hx of radiation for testicular cancer when pt was 11   PT Frequency 3x / week   PT Duration 8 weeks   PT Treatment/Interventions ADLs/Self Care Home Management;Therapeutic exercise;Compression bandaging;Manual lymph drainage;Manual techniques;Taping;Vasopneumatic Device   PT Next Visit Plan begin MLD and compression bandaging to RLE - hold off on left under right is treated   Consulted and Agree with Plan of Care Patient      Patient will benefit from skilled therapeutic intervention in order to improve the following deficits and impairments:  Increased edema, Decreased range of motion, Decreased endurance, Decreased mobility, Pain, Decreased knowledge of precautions, Decreased knowledge of use of DME  Visit Diagnosis: Lymphedema, not elsewhere classified     Problem List Patient Active Problem List   Diagnosis Date Noted  . Musculoskeletal pain 01/07/2016  . Personal history of malignant neoplasm of testis 07/12/2015  . Chronic pain 07/11/2015  . Long term current use of opiate analgesic 07/11/2015  . Long term prescription opiate use 07/11/2015  . Opiate use (22.5 MME/day) 07/11/2015  . Opiate dependence ([place]) 07/11/2015  . Encounter for therapeutic drug level monitoring 07/11/2015  . Chronic low back pain (midline) 07/11/2015  . Lumbar facet syndrome 07/11/2015  . Chronic lower extremity pain (Left) 07/11/2015  . Adult BMI 30+ 06/19/2015  . Hypomagnesemia 02/08/2015  . Physical deconditioning 02/08/2015  .  Enteritis due to Clostridium difficile 01/30/2015  . 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 16/06/9603  . Chronic diastolic congestive heart failure ([place])   . Type 2 diabetes mellitus ([place]) 12/12/2014  . 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 Obstructive apnea 05/23/2014  . Venous insufficiency of both lower extremities 04/02/2014  . Morbid obesity ([place]) 04/02/2014  . Personal history of transient ischemic attack (TIA), and cerebral infarction without residual deficits 02/23/2014  . Malignant neoplasm of testis ([place]) 02/23/2014  . Encounter for therapeutic drug monitoring 02/02/2014  . Long term (current) use of anticoagulants 07/15/2012  . Hypogonadism male 04/08/2012  . 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 Testicular hypofunction 04/08/2012  . History of colonic polyps 12/23/2011  . Testicular cancer ([place])   . TIA (transient ischemic attack)   . OSA (obstructive sleep apnea)   . Lumbar spinal stenosis (Severe L4-5) 10/08/2011  . ED (erectile dysfunction) of organic origin 10/08/2011  . Lumbar canal stenosis 10/08/2011  . CAD (coronary artery disease)   . DYSLIPIDEMIA 01/15/2010  . Chronic venous hypertension with ulcer ([place]) 01/15/2010  . Essential hypertension 12/14/2009  . Gout 11/01/2009  . MYOCARDIAL INFARCTION, HX OF 11/01/2009  . Atrial fibrillation, chronic 11/01/2009  . PERIPHERAL EDEMA 11/01/2009  . Ascending aortic dissection ([place]) 07/14/2008    Alexia Freestone 06/10/2016, 2:24 PM  [place], [place], [address] Phone: [phone]   Fax:  [phone]  Name: Meade Hogeland MRN: [id number] Date of Birth: 09-Apr-1961  Allyson Sabal, PT 06/10/16 2:25 PM  PHYSICAL THERAPY DISCHARGE SUMMARY  Visits from Start of Care: 1  Current functional level related to goals / functional outcomes: See above   Remaining deficits: See  above   Education / Equipment: See above  Plan: Patient agrees to discharge.  Patient goals were not met. Patient is being discharged due to not returning since the last visit.  ?????    Allyson Sabal [place], [place] 09/25/17 11:31 AM

## 2016-06-11 ENCOUNTER — Telehealth: Payer: Self-pay | Admitting: Internal Medicine

## 2016-06-11 ENCOUNTER — Encounter (HOSPITAL_COMMUNITY): Payer: Self-pay | Admitting: *Deleted

## 2016-06-11 NOTE — Telephone Encounter (Signed)
I have received correspondence from [facility] that Medicare Part D has denied request for cholestyramine at a lower cost for chronic diarrhea (patient asked for tier exception).

## 2016-06-11 NOTE — Telephone Encounter (Signed)
Additional information was just faxed less than 1 hour ago to [facility].

## 2016-06-13 ENCOUNTER — Telehealth: Payer: Self-pay | Admitting: Internal Medicine

## 2016-06-13 MED FILL — VANCOMYCIN 50MG/ML ORAL SOL: 11 days supply | Qty: 60 | Fill #0

## 2016-06-13 NOTE — Telephone Encounter (Signed)
Pt called wanting to know if he needed to continue his vancomycin over the weekend. Discussed with pt the he needed to continue it and pick up the next bottle from the pharmacy and taper the dose as instructed. Pt verbalized understanding and states he is doing his lovenox as instructed for his colonoscopy.

## 2016-06-17 ENCOUNTER — Encounter (HOSPITAL_COMMUNITY): Payer: Self-pay | Admitting: Certified Registered"

## 2016-06-17 ENCOUNTER — Encounter (HOSPITAL_COMMUNITY)
Admission: RE | Disposition: A | Discharge status: Home / Self Care | Payer: Self-pay | Source: Ambulatory Visit | Attending: Internal Medicine

## 2016-06-17 ENCOUNTER — Ambulatory Visit (HOSPITAL_COMMUNITY): Payer: Medicare HMO | Admitting: Certified Registered"

## 2016-06-17 ENCOUNTER — Ambulatory Visit (HOSPITAL_COMMUNITY)
Admission: RE | Admit: 2016-06-17 | Discharge: 2016-06-17 | Disposition: A | Discharge status: Home / Self Care | Payer: Medicare HMO | Source: Ambulatory Visit | Attending: Internal Medicine | Admitting: Internal Medicine

## 2016-06-17 DIAGNOSIS — I110 Hypertensive heart disease with heart failure: Secondary | ICD-10-CM | POA: Insufficient documentation

## 2016-06-17 DIAGNOSIS — Z1211 Encounter for screening for malignant neoplasm of colon: Secondary | ICD-10-CM

## 2016-06-17 DIAGNOSIS — K648 Other hemorrhoids: Secondary | ICD-10-CM | POA: Insufficient documentation

## 2016-06-17 DIAGNOSIS — K529 Noninfective gastroenteritis and colitis, unspecified: Secondary | ICD-10-CM | POA: Diagnosis not present

## 2016-06-17 DIAGNOSIS — D123 Benign neoplasm of transverse colon: Secondary | ICD-10-CM | POA: Diagnosis not present

## 2016-06-17 DIAGNOSIS — Z6841 Body Mass Index (BMI) 40.0 and over, adult: Secondary | ICD-10-CM | POA: Diagnosis not present

## 2016-06-17 DIAGNOSIS — I482 Chronic atrial fibrillation: Secondary | ICD-10-CM | POA: Insufficient documentation

## 2016-06-17 DIAGNOSIS — Z951 Presence of aortocoronary bypass graft: Secondary | ICD-10-CM | POA: Insufficient documentation

## 2016-06-17 DIAGNOSIS — Z86010 Personal history of colonic polyps: Secondary | ICD-10-CM | POA: Insufficient documentation

## 2016-06-17 DIAGNOSIS — Z952 Presence of prosthetic heart valve: Secondary | ICD-10-CM | POA: Insufficient documentation

## 2016-06-17 DIAGNOSIS — I252 Old myocardial infarction: Secondary | ICD-10-CM | POA: Diagnosis not present

## 2016-06-17 DIAGNOSIS — G4733 Obstructive sleep apnea (adult) (pediatric): Secondary | ICD-10-CM | POA: Insufficient documentation

## 2016-06-17 DIAGNOSIS — K621 Rectal polyp: Secondary | ICD-10-CM | POA: Diagnosis not present

## 2016-06-17 DIAGNOSIS — K6289 Other specified diseases of anus and rectum: Secondary | ICD-10-CM | POA: Diagnosis not present

## 2016-06-17 DIAGNOSIS — I2510 Atherosclerotic heart disease of native coronary artery without angina pectoris: Secondary | ICD-10-CM | POA: Diagnosis not present

## 2016-06-17 DIAGNOSIS — M1990 Unspecified osteoarthritis, unspecified site: Secondary | ICD-10-CM | POA: Diagnosis not present

## 2016-06-17 DIAGNOSIS — E1151 Type 2 diabetes mellitus with diabetic peripheral angiopathy without gangrene: Secondary | ICD-10-CM | POA: Insufficient documentation

## 2016-06-17 DIAGNOSIS — F17290 Nicotine dependence, other tobacco product, uncomplicated: Secondary | ICD-10-CM | POA: Diagnosis not present

## 2016-06-17 DIAGNOSIS — I082 Rheumatic disorders of both aortic and tricuspid valves: Secondary | ICD-10-CM | POA: Insufficient documentation

## 2016-06-17 DIAGNOSIS — I481 Persistent atrial fibrillation: Secondary | ICD-10-CM | POA: Diagnosis not present

## 2016-06-17 DIAGNOSIS — K5730 Diverticulosis of large intestine without perforation or abscess without bleeding: Secondary | ICD-10-CM | POA: Insufficient documentation

## 2016-06-17 DIAGNOSIS — Z8673 Personal history of transient ischemic attack (TIA), and cerebral infarction without residual deficits: Secondary | ICD-10-CM | POA: Insufficient documentation

## 2016-06-17 DIAGNOSIS — I5032 Chronic diastolic (congestive) heart failure: Secondary | ICD-10-CM | POA: Insufficient documentation

## 2016-06-17 DIAGNOSIS — Z9989 Dependence on other enabling machines and devices: Secondary | ICD-10-CM | POA: Diagnosis not present

## 2016-06-17 HISTORY — PX: COLONOSCOPY WITH PROPOFOL: SHX5780

## 2016-06-17 LAB — GLUCOSE, CAPILLARY
GLUCOSE-CAPILLARY: 79 mg/dL (ref 65–99)
GLUCOSE-CAPILLARY: 109 mg/dL — AB (ref 65–99)
Glucose-Capillary: 69 mg/dL (ref 65–99)

## 2016-06-17 LAB — CBC
HCT: 32.7 % — ABNORMAL LOW (ref 39.0–52.0)
HEMOGLOBIN: 8.9 g/dL — AB (ref 13.0–17.0)
MCH: 17.6 pg — AB (ref 26.0–34.0)
MCHC: 27.2 g/dL — AB (ref 30.0–36.0)
MCV: 64.6 fL — AB (ref 78.0–100.0)
PLATELETS: 154 10*3/uL (ref 150–400)
RBC: 5.06 MIL/uL (ref 4.22–5.81)
RDW: 20.4 % — ABNORMAL HIGH (ref 11.5–15.5)
WBC: 8.1 10*3/uL (ref 4.0–10.5)

## 2016-06-17 SURGERY — COLONOSCOPY WITH PROPOFOL
Anesthesia: Monitor Anesthesia Care

## 2016-06-17 MED ORDER — PROPOFOL 500 MG/50ML IV EMUL
INTRAVENOUS | Status: DC | PRN
  Administered 2016-06-17: 100 ug/kg/min via INTRAVENOUS

## 2016-06-17 MED ORDER — LIDOCAINE 2% (20 MG/ML) 5 ML SYRINGE
INTRAMUSCULAR | Status: AC
  Filled 2016-06-17: qty 5

## 2016-06-17 MED ORDER — SPOT INK MARKER SYRINGE KIT
PACK | SUBMUCOSAL | Status: AC
  Filled 2016-06-17: qty 5

## 2016-06-17 MED ORDER — LIDOCAINE 2% (20 MG/ML) 5 ML SYRINGE
INTRAMUSCULAR | Status: DC | PRN
  Administered 2016-06-17: 50 mg via INTRAVENOUS

## 2016-06-17 MED ORDER — SODIUM CHLORIDE 0.9 % IV SOLN: INTRAVENOUS | Status: DC

## 2016-06-17 MED ORDER — PROPOFOL 10 MG/ML IV BOLUS
INTRAVENOUS | Status: DC | PRN
  Administered 2016-06-17: 30 mg via INTRAVENOUS
  Administered 2016-06-17: 20 mg via INTRAVENOUS

## 2016-06-17 MED ORDER — PROPOFOL 10 MG/ML IV BOLUS
INTRAVENOUS | Status: AC
  Filled 2016-06-17: qty 20

## 2016-06-17 MED ORDER — LACTATED RINGERS IV SOLN
INTRAVENOUS | Status: DC
  Administered 2016-06-17: 1000 mL via INTRAVENOUS

## 2016-06-17 MED ORDER — PROPOFOL 10 MG/ML IV BOLUS
INTRAVENOUS | Status: AC
  Filled 2016-06-17: qty 40

## 2016-06-17 MED ORDER — SPOT INK MARKER SYRINGE KIT
PACK | SUBMUCOSAL | Status: DC | PRN
  Administered 2016-06-17: 7 mL via SUBMUCOSAL

## 2016-06-17 SURGICAL SUPPLY — 21 items

## 2016-06-17 NOTE — Anesthesia Preprocedure Evaluation (Signed)
Anesthesia Evaluation  Patient identified by MRN, date of birth, ID band Patient awake    Reviewed: Allergy & Precautions, NPO status , Patient's Chart, lab work & pertinent test results  Airway Mallampati: II  TM Distance: >3 FB Neck ROM: Full    Dental no notable dental hx.    Pulmonary neg pulmonary ROS, Current Smoker,    Pulmonary exam normal breath sounds clear to auscultation       Cardiovascular hypertension, + CAD, + Past MI, + CABG, + Peripheral Vascular Disease and +CHF  Normal cardiovascular exam+ dysrhythmias Atrial Fibrillation + Valvular Problems/Murmurs MR  Rhythm:Regular Rate:Normal  - Left ventricle: The cavity size was normal. There was severe   concentric hypertrophy. Systolic function was vigorous. The   estimated ejection fraction was in the range of 65% to 70%. Wall   motion was normal; there were no regional wall motion   abnormalities. The study is not technically sufficient to allow   evaluation of LV diastolic function. - Aortic valve: A mechanical prosthesis was present and functioning   normally. Peak velocity (S): 345 cm/s. Mean gradient (S): 28 mm   Hg. (Consistent with moderate aortic stenosis gradient) - Aorta: Post Bentall with ST Jude Mechanical Valve in 1988. Aortic   arch diameter: 40 mm. - Aortic arch: The aortic arch was mildly dilated. - Mitral valve: A mechanical prosthesis was present and functioning   normally. - Left atrium: The atrium was severely dilated. - Right ventricle: The cavity size was mildly dilated. Wall   thickness was normal. - Right atrium: The atrium was moderately dilated. - Atrial septum: The septum bowed from left to right, consistent   with increased left atrial pressure. - Tricuspid valve: There was moderate regurgitation. - Pulmonary arteries: Systolic pressure was moderately to severely   increased. PA peak pressure: 67 mm Hg (S).    Neuro/Psych TIACVA  negative psych ROS   GI/Hepatic negative GI ROS, Neg liver ROS,   Endo/Other  diabetesMorbid obesity  Renal/GU negative Renal ROS  negative genitourinary   Musculoskeletal negative musculoskeletal ROS (+)   Abdominal   Peds negative pediatric ROS (+)  Hematology negative hematology ROS (+)   Anesthesia Other Findings   Reproductive/Obstetrics negative OB ROS                             Anesthesia Physical Anesthesia Plan  ASA: IV  Anesthesia Plan: MAC   Post-op Pain Management:    Induction: Intravenous  Airway Management Planned: Simple Face Mask  Additional Equipment:   Intra-op Plan:   Post-operative Plan:   Informed Consent: I have reviewed the patients History and Physical, chart, labs and discussed the procedure including the risks, benefits and alternatives for the proposed anesthesia with the patient or authorized representative who has indicated his/her understanding and acceptance.   Dental advisory given  Plan Discussed with: CRNA and Surgeon  Anesthesia Plan Comments:         Anesthesia Quick Evaluation

## 2016-06-17 NOTE — Op Note (Signed)
[facility] Patient Name: Lance Cook Procedure Date: 06/17/2016 MRN: [id number] Attending MD: Jerene Bears , MD Date of Birth: April 09, 1961 CSN: [id number] Age: 55 Admit Type: Outpatient Procedure:                Colonoscopy Indications:              High risk colon cancer surveillance: Personal                            history of colonic polyps, Last colonoscopy: 2013,                            Incidental - Chronic diarrhea, Incidental -                            Clostridium difficile diarrhea Providers:                Lajuan Lines. Hilarie Fredrickson, MD, Malka So, RN, William Dalton, Technician Referring MD:              Medicines:                Monitored Anesthesia Care Complications:            No immediate complications. Estimated Blood Loss:     Estimated blood loss was minimal. Procedure:                Pre-Anesthesia Assessment:                           - Prior to the procedure, a History and Physical                            was performed, and patient medications and                            allergies were reviewed. The patient's tolerance of                            previous anesthesia was also reviewed. The risks                            and benefits of the procedure and the sedation                            options and risks were discussed with the patient.                            All questions were answered, and informed consent                            was obtained. Prior Anticoagulants: The patient has                            taken  Lovenox (enoxaparin), last dose was 1 day                            prior to procedure. ASA Grade Assessment: III - A                            patient with severe systemic disease. After                            reviewing the risks and benefits, the patient was                            deemed in satisfactory condition to undergo the                            procedure.                    After obtaining informed consent, the colonoscope                            was passed under direct vision. Throughout the                            procedure, the patient's blood pressure, pulse, and                            oxygen saturations were monitored continuously. The                            [id number] scope was introduced through                            the anus and advanced to the the cecum, identified                            by appendiceal orifice and ileocecal valve. The                            colonoscopy was performed without difficulty. The                            patient tolerated the procedure well. The quality                            of the bowel preparation was good. The ileocecal                            valve, appendiceal orifice, and rectum were                            photographed. Scope In: 10:45:09 AM Scope Out: 12:06:50 PM Scope Withdrawal Time: 1 hour 18 minutes 41 seconds  Total Procedure Duration: 1 hour 21 minutes 41 seconds  Findings:      The  digital rectal exam findings include palpable rectal polyps and       nodularity with contact oozing.      A 20 mm polyp was found in the transverse colon. The polyp was       pedunculated. The polyp was removed with a hot snare. Resection and       retrieval were complete. To prevent bleeding after the polypectomy, one       hemostatic clip was successfully placed (MR conditional).      A 25 mm polyp was found in the transverse colon. The polyp was       pedunculated. The polyp was removed with a hot snare. Resection and       retrieval were complete. To prevent bleeding after the polypectomy,       three hemostatic clips were successfully placed (MR conditional).      A 6 mm polyp was found in the transverse colon. The polyp was       semi-pedunculated. The polyp was removed with a hot snare. Resection and       retrieval were complete.      A 40 mm polyp was  found in the transverse colon. The polyp was       pedunculated. One Endoloop was successfully placed initially with       apparent good placement. The polyp was removed with a piecemeal       technique using a hot snare. Resection and retrieval were complete       (polypectomy preformed distal to the Endoloop which remains in place).      A 15 mm polyp was found in the transverse colon. The polyp was       pedunculated. The polyp was removed with a hot snare. Resection and       retrieval were complete. To prevent bleeding after the polypectomy, one       hemostatic clip was successfully placed (MR conditional).      A 6 mm polyp was found in the distal transverse colon. The polyp was       sessile. The polyp was removed with a hot snare. Resection and retrieval       were complete.      A few sessile and semi-pedunculated polyps were found in the distal       rectum approaching/approximating the dentate line. Internal hemorrhoids       are also present. Polypectomy was not attempted due to location near       hemorrhoidal tissue and proximity to the dentate line.      Multiple small and large-mouthed diverticula were found from cecum to       sigmoid colon.      Mildly edematous mucosa was found in the recto-sigmoid colon and in the       sigmoid colon without the typical appearance of IBD/colitis. No evidence       of pseudomembranes. Impression:               - Palpable rectal mass found on digital rectal exam.                           - One 20 mm polyp in the transverse colon, removed                            with a hot snare. Resected and retrieved. Clip (  MR                            conditional) was placed.                           - One 25 mm polyp in the transverse colon, removed                            with a hot snare. Resected and retrieved. Clips (MR                            conditional) were placed.                           - One 6 mm polyp in the transverse  colon, removed                            with a hot snare. Resected and retrieved.                           - One 40 mm polyp in the transverse colon, removed                            piecemeal using a hot snare. Resected and                            retrieved. Ligated.                           - One 15 mm polyp in the transverse colon, removed                            with a hot snare. Resected and retrieved. Clip (MR                            conditional) was placed.                           - One 6 mm polyp in the distal transverse colon,                            removed with a hot snare. Resected and retrieved.                           - A few polyps (adenomatous-appearing) in the                            rectum. Resection not attempted.                           - Mild diverticulosis from cecum to sigmoid colon. Moderate Sedation:      N/A Recommendation:           - Patient has a contact number available  for                            emergencies. The signs and symptoms of potential                            delayed complications were discussed with the                            patient. Return to normal activities tomorrow.                            Written discharge instructions were provided to the                            patient.                           - Resume previous diet.                           - Continue present medications.                           - Await pathology results.                           - Repeat colonoscopy in 3-6 months to review the                            polypectomy site.                           - The findings and recommendations were discussed                            with the patient and their family.                           - No ibuprofen, naproxen, or other non-steroidal                            anti-inflammatory drugs for 4 weeks after polyp                            removal.                           -  Resume Coumadin (warfarin) tomorrow and Lovenox                            (enoxaparin) tomorrow at prior doses. Refer to                            managing physician for further adjustment of  therapy.                           - Referral back to general surgery for further                            evaluation and probable surgical resection/ablation                            of these lesions (Dr. Marcello Moores or Dr. Johney Maine). Procedure Code(s):        --- Professional ---                           [phone], Colonoscopy, flexible; with removal of                            tumor(s), polyp(s), or other lesion(s) by snare                            technique Diagnosis Code(s):        --- Professional ---                           Z86.010, Personal history of colonic polyps                           K62.89, Other specified diseases of anus and rectum                           D12.3, Benign neoplasm of transverse colon (hepatic                            flexure or splenic flexure)                           D12.8, Benign neoplasm of rectum                           K57.30, Diverticulosis of large intestine without                            perforation or abscess without bleeding CPT copyright 2016 American Medical Association. All rights reserved. The codes documented in this report are preliminary and upon coder review may  be revised to meet current compliance requirements. Jerene Bears, MD 06/17/2016 12:30:24 PM This report has been signed electronically. Number of Addenda: 0

## 2016-06-17 NOTE — Anesthesia Postprocedure Evaluation (Signed)
Anesthesia Post Note  Patient: Lance Cook  Procedure(s) Performed: Procedure(s) (LRB): COLONOSCOPY WITH PROPOFOL (N/A)  Patient location during evaluation: PACU Anesthesia Type: MAC Level of consciousness: awake and alert Pain management: pain level controlled Vital Signs Assessment: post-procedure vital signs reviewed and stable Respiratory status: spontaneous breathing, nonlabored ventilation, respiratory function stable and patient connected to nasal cannula oxygen Cardiovascular status: stable and blood pressure returned to baseline Anesthetic complications: no    Last Vitals:  Vitals:   06/17/16 1236 06/17/16 1250  BP: 133/62 125/68  Pulse: 76   Resp: (!) 25   Temp: (!) 35.6 C     Last Pain:  Vitals:   06/17/16 1250  TempSrc:   PainSc: 0-No pain                 Lyana Asbill S

## 2016-06-17 NOTE — H&P (Signed)
HPI: Lance Cook is a 55 year old male with a, gated past medical history including adenomatous colon polyps, chronic diarrhea, history of recurrent C. difficile colitis, history of salmonella colitis, gastritis, IDA, aortic root replacement with St. Jude mechanical valve, history of aortic dissection, chronic diastolic heart failure, pulmonary hypertension, chronic persistent A. fib and venous insufficiency with lower leg edema here for outpatient colonoscopy. He was initially seen in the office in June 2017 to discuss his history of adenomatous and tubulovillous adenoma of the colon. At that time he was slightly overdue for surveillance and we arrange the test. We also discuss chronic diarrhea at that time for which she had been on cholestyramine. Stools studies were done which revealed C. difficile and Salmonella. He was treated for both. He's had subsequent recurrent C. difficile delaying colonoscopy. He is now in the middle of a vancomycin taper and his diarrhea has improved but not completely resolved. He still reports mucus in his stool. Today he denies abdominal pain, rectal bleeding or melena. No recent chest pain, stable dyspnea on exertion but no worsening shortness of breath. He was bridged to Lovenox from warfarin and he took 2 doses yesterday that was told to hold the evening dose. His last dose was 12 hours ago. His procedure was moved from 8:30 AM to 10:30 AM given his evening Lovenox as per anesthesia.  He was referred ID by me but canceled this appointment for on clear reasons.  Past Medical History:  Diagnosis Date  . Acute respiratory failure with hypoxemia ([place])   . Anemia   . Anxiety   . Aortic aneurysm ([place])   . Aortic aneurysm and dissection ([place])   . ARDS (adult respiratory distress syndrome) ([place]) 01/27/2015  . Arthritis   . Ascending aortic dissection ([place]) 07/14/2008   Localized dissection of ascending aorta noted on CTA in 2009 and stable on CTA in 2011  . Asymptomatic  chronic venous hypertension 01/15/2010   Overview:  Overview:  Qualifier: Diagnosis of  By: [name] MD, [name]   Last Assessment & Plan:  I suggested he go to a vein clinic to see if he could be qualified compression hose of some type, since he is not a surgical candidate according to the vascular surgeons.   . Atrial fibrillation ([place])    chronic persistent  . Bell's palsy   . CAD (coronary artery disease)    Old scar inferior wall myoview, 10/2009 EF 52%.  He did have previous SVG to RCA but no obstructive disease noted on his most recent cath.  SVG occluded.   . Cellulitis 04/02/2014  . Cerebral artery occlusion with cerebral infarction ([place]) 10/08/2011   Overview:  Overview:  And hx of TIA prior to CABG, all thought due to systemic emboli prior to coumadin   . CHF (congestive heart failure) ([place])   . Chronic diastolic congestive heart failure ([place])   . Chronic diastolic heart failure ([place]) 03/01/2015  . Chronic LBP 10/08/2011  . CVA (cerebral vascular accident) ([place]) 10/08/2011   And hx of TIA prior to CABG, all thought due to systemic emboli prior to coumadin   . Diabetes mellitus    diet controlled  . DIABETES MELLITUS, TYPE II 11/01/2009   Qualifier: Diagnosis of  By: [name] MD, [name]    . Diverticulosis   . Dysrhythmia    Dr. [name] follows-LOV 05-08-16 Epic.  . ED (erectile dysfunction) of organic origin 10/08/2011   Overview:  Last Assessment & Plan:  S/p unilateral orchiectomy, for testosterone level,  consider androgel pump 1.62    . Encephalopathy acute 02/05/2015  . Erectile dysfunction 10/08/2011  . GERD (gastroesophageal reflux disease)    not needing medication at thhis time- 01/22/15  . Gout   . H/O mechanical aortic valve replacement 11/01/2009   Qualifier: Diagnosis of  By: [name], [name]    . Hiatal hernia   . History of adenomatous polyp of colon 12/23/2011   Colonoscopy January 2010, 6 mm rectal tubulovillous adenoma. No high-grade dysplasia   . History of colon polyps  12/23/2011   Overview:  Overview:  Colonoscopy January 2010, 6 mm rectal tubulovillous adenoma. No high-grade dysplasia   . Hyperlipidemia   . Hypertension   . Hypogonadism male 04/08/2012  . Impaired glucose tolerance 10/08/2011  . Leg pain 06/28/2010  . Morbid obesity ([place]) 04/02/2014  . Myocardial infarction age 71  . Obesity   . OSA (obstructive sleep apnea)    CPAP  . Peripheral vascular disease ([place])   . Reflux 11/06/09  . 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 [id number]   33 mm St Jude bileaflet mechanical valve placed via right mini thoracotomy approach  . 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    1988 - Dr Blase Mess at [facility] in [place], [place]  . Severe mitral regurgitation 10/11/2014  . Shortness of breath dyspnea    with exertion  . Stroke [facility])    TIA and Stroke no residual effect- 1st was 50  . Testicular cancer ([place])   . TIA (transient ischemic attack)    age 39  . Varicose veins     Past Surgical History:  Procedure Laterality Date  . aortic truck    . BENTALL PROCEDURE  1988   25 mm St Jude mechanical valve conduit - Dr Blase Mess at [facility] in [place], [place]  . CHOLECYSTECTOMY  2011  . CORONARY ARTERY BYPASS GRAFT    . LASER ABLATION  03/06/2010   left leg  . MITRAL VALVE REPLACEMENT Right 01/24/2015   Procedure: Re-Operation, MINIMALLY INVASIVE MITRAL VALVE (MV) REPLACEMENT.;  Surgeon: Rexene Alberts, MD;  Location: [facility];  Service: Open Heart Surgery;  Laterality: Right;  . ORCHIECTOMY  age 23   testicular cancer  . OTOPLASTY     bilateral, age 58  . TEE WITHOUT CARDIOVERSION N/A 10/11/2014   Procedure: TRANSESOPHAGEAL ECHOCARDIOGRAM (TEE);  Surgeon: Pixie Casino, MD;  Location[name] ENDOSCOPY;  Service: Cardiovascular;  Laterality: N/A;  . TEE WITHOUT CARDIOVERSION N/A 01/24/2015   Procedure: TRANSESOPHAGEAL ECHOCARDIOGRAM  (TEE);  Surgeon: Rexene Alberts, MD;  Location: [facility];  Service: Open Heart Surgery;  Laterality: N/A;  . TONSILLECTOMY  1967  . TOOTH EXTRACTION  2016     (Not in an outpatient encounter)  Allergies  Allergen Reactions  . Penicillins     REACTION: Hives Has patient had a PCN reaction causing immediate rash, facial/tongue/throat swelling, SOB or lightheadedness with hypotension: unsure Has patient had a PCN reaction causing severe rash involving mucus membranes or skin necrosis: unsure Has patient had a PCN reaction that required hospitalization unsure Has patient had a PCN reaction occurring within the last 10 years: yes If all of the above answers are "NO", then may proceed with Cephalosporin use.  . Adhesive [Tape] Other (See Comments)    Shin irritation  . Bactrim [Sulfamethoxazole-Trimethoprim]     Family History  Problem Relation Age  of Onset  . Heart disease Mother   . Throat cancer Mother   . Diabetes Father   . Stroke Other   . Hypertension Other   . Cancer Maternal Aunt     lung, brain  . Cancer Maternal Uncle     brain aneurysm    Social History  Substance Use Topics  . Smoking status: Light Tobacco Smoker    Types: Cigars  . Smokeless tobacco: Never Used     Comment: about 3 yearly- cigar  . Alcohol use 0.0 oz/week     Comment: social    ROS: As per history of present illness, otherwise negative  BP (!) 127/55   Pulse 69   Temp 97.8 F (36.6 C) (Oral)   Resp 20   Ht 5\' 1"  (1.549 m)   Wt 230 lb (104.3 kg)   SpO2 99%   BMI 43.46 kg/m  Gen: awake, alert, NAD HEENT: anicteric, op clear CV: irreg, mechanical S2 Pulm: CTA b/l Abd: soft, obese, NT/ND, +BS throughout Ext: 2+ LE edema Neuro: nonfocal   RELEVANT LABS AND IMAGING: CBC    Component Value Date/Time   WBC 7.6 02/05/2016 1751   RBC 4.35 02/05/2016 1751   HGB 8.4 (L) 02/05/2016 1751   HGB 15.1 03/02/2013 1132   HGB 13.2 02/03/2012 1144   HCT 29.7 (L) 02/05/2016 1751   HCT 44.5  03/02/2013 1132   HCT 41.5 02/03/2012 1144   PLT 185 02/05/2016 1751   PLT 126 (L) 03/02/2013 1132   PLT 122 (L) 02/03/2012 1144   MCV 68.3 (L) 02/05/2016 1751   MCV 87 03/02/2013 1132   MCV 79.7 02/03/2012 1144   MCH 19.3 (L) 02/05/2016 1751   MCHC 28.3 (L) 02/05/2016 1751   RDW 18.8 (H) 02/05/2016 1751   RDW 17.0 (H) 03/02/2013 1132   RDW 23.4 (H) 02/03/2012 1144   LYMPHSABS 0.5 (L) 02/05/2016 1751   LYMPHSABS 1.7 09/22/2012 0413   LYMPHSABS 1.0 02/03/2012 1144   MONOABS 0.6 02/05/2016 1751   MONOABS 0.9 09/22/2012 0413   MONOABS 0.5 02/03/2012 1144   EOSABS 0.2 02/05/2016 1751   EOSABS 0.2 09/22/2012 0413   BASOSABS 0.0 02/05/2016 1751   BASOSABS 0.1 09/22/2012 0413   BASOSABS 0.1 02/03/2012 1144    CMP     Component Value Date/Time   NA 137 05/08/2016 1213   NA 138 12/09/2013 1606   K 4.0 05/08/2016 1213   K 4.3 12/09/2013 1606   CL 103 05/08/2016 1213   CL 104 12/09/2013 1606   CO2 27 05/08/2016 1213   CO2 30 12/09/2013 1606   GLUCOSE 73 05/08/2016 1213   GLUCOSE 85 12/09/2013 1606   BUN 23 05/08/2016 1213   BUN 22 (H) 12/09/2013 1606   CREATININE 0.83 05/08/2016 1213   CALCIUM 7.7 (L) 05/08/2016 1213   CALCIUM 9.2 12/09/2013 1606   PROT 5.0 (L) 02/05/2016 1751   PROT 7.1 12/09/2013 1606   ALBUMIN 2.6 (L) 02/05/2016 1751   ALBUMIN 3.7 12/09/2013 1606   AST 21 02/05/2016 1751   AST 29 12/09/2013 1606   ALT 19 02/05/2016 1751   ALT 26 12/09/2013 1606   ALKPHOS 55 02/05/2016 1751   ALKPHOS 56 12/09/2013 1606   BILITOT 0.7 02/05/2016 1751   BILITOT 0.9 12/09/2013 1606   GFRNONAA >60 02/05/2016 1751   GFRNONAA >60 12/09/2013 1606   GFRAA >60 02/05/2016 1751   GFRAA >60 12/09/2013 1606    ASSESSMENT/PLAN: 55 year old male with a, gated past medical history  including adenomatous colon polyps, chronic diarrhea, history of recurrent C. difficile colitis, history of salmonella colitis, gastritis, IDA, aortic root replacement with St. Jude mechanical valve,  history of aortic dissection, chronic diastolic heart failure, pulmonary hypertension, chronic persistent A. fib and venous insufficiency with lower leg edema here for outpatient colonoscopy.  1. Hx of adenomatous colon polyps -- surveillance colon today.  Recent C. Diff on vancomycin taper.  Diarrhea has improved with this treatment.  HIGHER THAN BASELINE RISK.The nature of the procedure, as well as the risks, benefits, and alternatives were carefully and thoroughly reviewed with the patient. Ample time for discussion and questions allowed. The patient understood, was satisfied, and agreed to proceed.  --I encouraged him to reschedule ID appt given recurrent c diff and possible need for FMT if recurrent c diff.

## 2016-06-17 NOTE — Discharge Instructions (Addendum)

## 2016-06-17 NOTE — Transfer of Care (Signed)
Immediate Anesthesia Transfer of Care Note  Patient: Lance Cook  Procedure(s) Performed: Procedure(s): COLONOSCOPY WITH PROPOFOL (N/A)  Patient Location: PACU  Anesthesia Type:MAC  Level of Consciousness: awake, alert  and oriented  Airway & Oxygen Therapy: Patient Spontanous Breathing and Patient connected to face mask oxygen  Post-op Assessment: Report given to RN and Post -op Vital signs reviewed and stable  Post vital signs: Reviewed and stable  Last Vitals:  Vitals:   06/17/16 0818  BP: (!) 127/55  Pulse: 69  Resp: 20  Temp: 36.6 C    Last Pain:  Vitals:   06/17/16 0818  TempSrc: Oral  PainSc: 7          Complications: No apparent anesthesia complications

## 2016-06-18 ENCOUNTER — Telehealth: Payer: Self-pay | Admitting: Internal Medicine

## 2016-06-18 ENCOUNTER — Encounter (HOSPITAL_COMMUNITY): Payer: Self-pay | Admitting: Internal Medicine

## 2016-06-18 NOTE — Telephone Encounter (Signed)
Reviewed colon report instructions with him regarding his coumadin and lovenox, questions were answered.

## 2016-06-23 ENCOUNTER — Ambulatory Visit (INDEPENDENT_AMBULATORY_CARE_PROVIDER_SITE_OTHER): Payer: Medicare HMO | Admitting: Pharmacist Clinician (PhC)/ Clinical Pharmacy Specialist

## 2016-06-23 DIAGNOSIS — I481 Persistent atrial fibrillation: Secondary | ICD-10-CM | POA: Diagnosis not present

## 2016-06-23 DIAGNOSIS — I4819 Other persistent atrial fibrillation: Secondary | ICD-10-CM

## 2016-06-23 DIAGNOSIS — Z5181 Encounter for therapeutic drug level monitoring: Secondary | ICD-10-CM

## 2016-06-23 DIAGNOSIS — I482 Chronic atrial fibrillation, unspecified: Secondary | ICD-10-CM

## 2016-06-23 DIAGNOSIS — Z952 Presence of prosthetic heart valve: Secondary | ICD-10-CM | POA: Diagnosis not present

## 2016-06-23 LAB — POCT INR: INR: 1.9

## 2016-06-24 ENCOUNTER — Telehealth: Payer: Self-pay | Admitting: *Deleted

## 2016-06-24 ENCOUNTER — Other Ambulatory Visit: Payer: Self-pay | Admitting: Internal Medicine

## 2016-06-24 DIAGNOSIS — Z86010 Personal history of colonic polyps: Secondary | ICD-10-CM

## 2016-06-24 NOTE — Telephone Encounter (Signed)
Will need bridging protocol.

## 2016-06-24 NOTE — Telephone Encounter (Signed)
  Lance Cook 10/16/1960 [place]:[id number]  Dear Dr [name]:  We have scheduled the above named patient for a(n) colonoscopy procedure. Our records show that (s)he is on anticoagulation therapy.  Please advise as to whether the patient may come of their therapy of coumadin 5 days prior to their procedure which is scheduled for 09/02/16. Of note, patient had colonoscopy 06/17/16 requiring lovenox bridge. This is a repeat colonoscopy to ensure complete polyp resection.  Please route your response to Dixon Boos, CMA.  Sincerely,  [facility]

## 2016-06-25 ENCOUNTER — Emergency Department (HOSPITAL_COMMUNITY)
Admission: EM | Admit: 2016-06-25 | Discharge: 2016-06-25 | Disposition: A | Discharge status: Home / Self Care | Payer: Medicare HMO | Attending: Emergency Medicine | Admitting: Emergency Medicine

## 2016-06-25 ENCOUNTER — Emergency Department (HOSPITAL_COMMUNITY): Payer: Medicare HMO

## 2016-06-25 ENCOUNTER — Encounter (HOSPITAL_COMMUNITY): Payer: Self-pay | Admitting: Emergency Medicine

## 2016-06-25 DIAGNOSIS — Z7901 Long term (current) use of anticoagulants: Secondary | ICD-10-CM | POA: Insufficient documentation

## 2016-06-25 DIAGNOSIS — I252 Old myocardial infarction: Secondary | ICD-10-CM | POA: Insufficient documentation

## 2016-06-25 DIAGNOSIS — I110 Hypertensive heart disease with heart failure: Secondary | ICD-10-CM | POA: Diagnosis not present

## 2016-06-25 DIAGNOSIS — E119 Type 2 diabetes mellitus without complications: Secondary | ICD-10-CM | POA: Insufficient documentation

## 2016-06-25 DIAGNOSIS — Z8673 Personal history of transient ischemic attack (TIA), and cerebral infarction without residual deficits: Secondary | ICD-10-CM | POA: Diagnosis not present

## 2016-06-25 DIAGNOSIS — I2510 Atherosclerotic heart disease of native coronary artery without angina pectoris: Secondary | ICD-10-CM | POA: Insufficient documentation

## 2016-06-25 DIAGNOSIS — N5089 Other specified disorders of the male genital organs: Secondary | ICD-10-CM | POA: Insufficient documentation

## 2016-06-25 DIAGNOSIS — I5032 Chronic diastolic (congestive) heart failure: Secondary | ICD-10-CM | POA: Insufficient documentation

## 2016-06-25 DIAGNOSIS — R600 Localized edema: Secondary | ICD-10-CM | POA: Diagnosis not present

## 2016-06-25 DIAGNOSIS — Z951 Presence of aortocoronary bypass graft: Secondary | ICD-10-CM | POA: Diagnosis not present

## 2016-06-25 DIAGNOSIS — F17290 Nicotine dependence, other tobacco product, uncomplicated: Secondary | ICD-10-CM | POA: Insufficient documentation

## 2016-06-25 LAB — BASIC METABOLIC PANEL
ANION GAP: 5 (ref 5–15)
BUN: 17 mg/dL (ref 6–20)
CHLORIDE: 101 mmol/L (ref 101–111)
CO2: 30 mmol/L (ref 22–32)
Calcium: 8 mg/dL — ABNORMAL LOW (ref 8.9–10.3)
Creatinine, Ser: 0.94 mg/dL (ref 0.61–1.24)
GFR calc Af Amer: >60 mL/min (ref >60)
GFR calc non Af Amer: >60 mL/min (ref >60)
GLUCOSE: 83 mg/dL (ref 65–99)
POTASSIUM: 4.2 mmol/L (ref 3.5–5.1)
Sodium: 136 mmol/L (ref 135–145)

## 2016-06-25 LAB — URINALYSIS, ROUTINE W REFLEX MICROSCOPIC
Bilirubin Urine: NEGATIVE
GLUCOSE, UA: NEGATIVE mg/dL
Hgb urine dipstick: NEGATIVE
KETONES UR: NEGATIVE mg/dL
LEUKOCYTES UA: NEGATIVE
Nitrite: NEGATIVE
PH: 6 (ref 5.0–8.0)
Protein, ur: NEGATIVE mg/dL
SPECIFIC GRAVITY, URINE: 1.017 (ref 1.005–1.030)

## 2016-06-25 LAB — CBC
HEMATOCRIT: 31.5 % — AB (ref 39.0–52.0)
HEMOGLOBIN: 8.6 g/dL — AB (ref 13.0–17.0)
MCH: 17.8 pg — ABNORMAL LOW (ref 26.0–34.0)
MCHC: 27.3 g/dL — AB (ref 30.0–36.0)
MCV: 65.1 fL — AB (ref 78.0–100.0)
Platelets: 168 10*3/uL (ref 150–400)
RBC: 4.84 MIL/uL (ref 4.22–5.81)
RDW: 20 % — AB (ref 11.5–15.5)
WBC: 7.6 10*3/uL (ref 4.0–10.5)

## 2016-06-25 LAB — BRAIN NATRIURETIC PEPTIDE: B Natriuretic Peptide: 175.3 pg/mL — ABNORMAL HIGH (ref 0.0–100.0)

## 2016-06-25 NOTE — ED Notes (Signed)
EDP at bedside  

## 2016-06-25 NOTE — ED Triage Notes (Signed)
Pt in EMS from home, reports gen weakness past 3-5 days. Severe edema to lower extremities and genitalia. A/OX4, VSS. Afib on monitor, hx of same.

## 2016-06-25 NOTE — ED Provider Notes (Signed)
[facility] DEPT Provider Note   CSN: [id number] Arrival date & time: 06/25/16  [id number]     History   Chief Complaint Chief Complaint  Patient presents with  . Weakness    HPI Lance Cook is a 55 y.o. male.  The history is provided by the patient and medical records. No language interpreter was used.    Lance Cook is a 55 y.o. male  with an extensive PMH including but not limited to aortic and mitral valve replacement, CHF, prior MI, prior stroke, chronic leg swelling, c. Diff on vanc who presents to the Emergency Department complaining of worsening swelling to genitalia which he states was 3x the normal size. Denies pain, tenderness, erythema. Improved when he stays off of his feet. Ice helps as well. He endorses bilateral lower extremity swelling for several years now which he states is about baseline. He states he has occasionally taken an extra dose of his Lasix for the last 2 weeks with no improvement of swelling. He denies fever, chills, chest pain, shortness of breath, back pain.   Past Medical History:  Diagnosis Date  . Acute respiratory failure with hypoxemia ([place])   . Anemia   . Anxiety   . Aortic aneurysm ([place])   . Aortic aneurysm and dissection ([place])   . ARDS (adult respiratory distress syndrome) ([place]) 01/27/2015  . Arthritis   . Ascending aortic dissection ([place]) 07/14/2008   Localized dissection of ascending aorta noted on CTA in 2009 and stable on CTA in 2011  . Asymptomatic chronic venous hypertension 01/15/2010   Overview:  Overview:  Qualifier: Diagnosis of  By: [name] MD, [name]   Last Assessment & Plan:  I suggested he go to a vein clinic to see if he could be qualified compression hose of some type, since he is not a surgical candidate according to the vascular surgeons.   . Atrial fibrillation ([place])    chronic persistent  . Bell's palsy   . CAD (coronary artery disease)    Old scar inferior wall myoview, 10/2009 EF 52%.  He did have previous SVG to RCA  but no obstructive disease noted on his most recent cath.  SVG occluded.   . Cellulitis 04/02/2014  . Cerebral artery occlusion with cerebral infarction ([place]) 10/08/2011   Overview:  Overview:  And hx of TIA prior to CABG, all thought due to systemic emboli prior to coumadin   . CHF (congestive heart failure) ([place])   . Chronic diastolic congestive heart failure ([place])   . Chronic diastolic heart failure ([place]) 03/01/2015  . Chronic LBP 10/08/2011  . CVA (cerebral vascular accident) ([place]) 10/08/2011   And hx of TIA prior to CABG, all thought due to systemic emboli prior to coumadin   . Diabetes mellitus    diet controlled  . DIABETES MELLITUS, TYPE II 11/01/2009   Qualifier: Diagnosis of  By: [name] MD, [name]    . Diverticulosis   . Dysrhythmia    Dr. [name] follows-LOV 05-08-16 Epic.  . 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 Encephalopathy acute 02/05/2015  . Erectile dysfunction 10/08/2011  . GERD (gastroesophageal reflux disease)    not needing medication at thhis time- 01/22/15  . Gout   . H/O mechanical aortic valve replacement 11/01/2009   Qualifier: Diagnosis of  By: [name], [name]    . Hiatal hernia   . History of  adenomatous polyp of colon 12/23/2011   Colonoscopy January 2010, 6 mm rectal tubulovillous adenoma. No high-grade dysplasia   . History of colon polyps 12/23/2011   Overview:  Overview:  Colonoscopy January 2010, 6 mm rectal tubulovillous adenoma. No high-grade dysplasia   . Hyperlipidemia   . Hypertension   . Hypogonadism male 04/08/2012  . Impaired glucose tolerance 10/08/2011  . Leg pain 06/28/2010  . Morbid obesity ([place]) 04/02/2014  . Myocardial infarction age 69  . Obesity   . OSA (obstructive sleep apnea)    CPAP  . Peripheral vascular disease ([place])   . Reflux 11/06/09  . S/P  minimally invasive mitral valve replacement with metallic  valve [id number]   33 mm St Jude bileaflet mechanical valve placed via right mini thoracotomy approach  . 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    1988 - Dr Blase Mess at [facility] in [place], [place]  . Severe mitral regurgitation 10/11/2014  . Shortness of breath dyspnea    with exertion  . Stroke [facility])    TIA and Stroke no residual effect- 1st was 67  . Testicular cancer ([place])   . TIA (transient ischemic attack)    age 16  . Varicose veins     Patient Active Problem List   Diagnosis Date Noted  . Chronic diarrhea   . Benign neoplasm of transverse colon   . Musculoskeletal pain 01/07/2016  . Personal history of malignant neoplasm of testis 07/12/2015  . Chronic pain 07/11/2015  . Long term current use of opiate analgesic 07/11/2015  . Long term prescription opiate use 07/11/2015  . Opiate use (22.5 MME/day) 07/11/2015  . Opiate dependence ([place]) 07/11/2015  . Encounter for therapeutic drug level monitoring 07/11/2015  . Chronic low back pain (midline) 07/11/2015  . Lumbar facet syndrome 07/11/2015  . Chronic lower extremity pain (Left) 07/11/2015  . Adult BMI 30+ 06/19/2015  . Hypomagnesemia 02/08/2015  . Physical deconditioning 02/08/2015  . Enteritis due to Clostridium difficile 01/30/2015  . 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 [id number]  . Chronic diastolic congestive heart failure ([place])   . Type 2 diabetes mellitus ([place]) 12/12/2014  . 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 Obstructive apnea 05/23/2014  . Venous insufficiency of both lower extremities 04/02/2014  . Morbid obesity ([place]) 04/02/2014  . Personal history of transient ischemic attack (TIA), and cerebral infarction without residual deficits 02/23/2014  . Malignant neoplasm of testis ([place]) 02/23/2014  . Encounter for therapeutic drug monitoring 02/02/2014  . Long term (current) use of  anticoagulants 07/15/2012  . Hypogonadism male 04/08/2012  . 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 Testicular hypofunction 04/08/2012  . History of colonic polyps 12/23/2011  . Testicular cancer ([place])   . TIA (transient ischemic attack)   . OSA (obstructive sleep apnea)   . Lumbar spinal stenosis (Severe L4-5) 10/08/2011  . ED (erectile dysfunction) of organic origin 10/08/2011  . Lumbar canal stenosis 10/08/2011  . CAD (coronary artery disease)   . DYSLIPIDEMIA 01/15/2010  . Chronic venous hypertension with ulcer ([place]) 01/15/2010  . Essential hypertension 12/14/2009  . Gout 11/01/2009  . MYOCARDIAL INFARCTION, HX OF 11/01/2009  . Atrial fibrillation, chronic 11/01/2009  . PERIPHERAL EDEMA 11/01/2009  . Ascending aortic dissection ([place]) 07/14/2008    Past Surgical History:  Procedure Laterality Date  . aortic truck    . BENTALL PROCEDURE  1988   25 mm St Jude mechanical valve  conduit - Dr Blase Mess at [facility] in [place], [place]  . CHOLECYSTECTOMY  2011  . COLONOSCOPY WITH PROPOFOL N/A 06/17/2016   Procedure: COLONOSCOPY WITH PROPOFOL;  Surgeon: Jerene Bears, MD;  Location: [facility] ENDOSCOPY;  Service: Gastroenterology;  Laterality: N/A;  . CORONARY ARTERY BYPASS GRAFT    . LASER ABLATION  03/06/2010   left leg  . MITRAL VALVE REPLACEMENT Right 01/24/2015   Procedure: Re-Operation, MINIMALLY INVASIVE MITRAL VALVE (MV) REPLACEMENT.;  Surgeon: Rexene Alberts, MD;  Location: [facility];  Service: Open Heart Surgery;  Laterality: Right;  . ORCHIECTOMY  age 72   testicular cancer  . OTOPLASTY     bilateral, age 69  . TEE WITHOUT CARDIOVERSION N/A 10/11/2014   Procedure: TRANSESOPHAGEAL ECHOCARDIOGRAM (TEE);  Surgeon: Pixie Casino, MD;  Location: [facility] ENDOSCOPY;  Service: Cardiovascular;  Laterality: N/A;  . TEE WITHOUT CARDIOVERSION N/A 01/24/2015   Procedure: TRANSESOPHAGEAL ECHOCARDIOGRAM (TEE);  Surgeon: Rexene Alberts, MD;  Location: [facility];   Service: Open Heart Surgery;  Laterality: N/A;  . TONSILLECTOMY  1967  . TOOTH EXTRACTION  2016       Home Medications    Prior to Admission medications   Medication Sig Start Date End Date Taking? Authorizing Provider  acetaminophen (TYLENOL) 500 MG tablet Take 500 mg by mouth 2 (two) times daily.   Yes Historical Provider, MD  allopurinol (ZYLOPRIM) 300 MG tablet Take 300 mg by mouth daily. 11/17/12  Yes Biagio Borg, MD  atenolol (TENORMIN) 100 MG tablet Take 0.5 tablets (50 mg total) by mouth 2 (two) times daily. 02/15/15  Yes Ivan Anchors Love, PA-C  b complex vitamins tablet Take 1 tablet by mouth daily.     Yes Historical Provider, MD  bacitracin 500 UNIT/GM ointment Apply 1 application topically as needed for wound care.    Yes Historical Provider, MD  Calcium-Magnesium-Zinc (CAL-MAG-ZINC PO) Take 1 tablet by mouth daily.    Yes Historical Provider, MD  clindamycin (CLEOCIN) 300 MG capsule Take 300 mg by mouth as needed (for dental appointments).  01/04/16  Yes Historical Provider, MD  digoxin (LANOXIN) 0.25 MG tablet TAKE 1 TABLET EVERY DAY 04/30/16  Yes [name], MD  enoxaparin (LOVENOX) 100 MG/ML injection Inject 1 mL (100 mg total) into the skin every 12 (twelve) hours. Patient taking differently: Inject 100 mg into the skin every 12 (twelve) hours. 06-11-16 Pt has instructions to stop Coumadin and bridge with Lovenox with last dose to be AM day before procedure. 03/18/16  Yes [name], MD  furosemide (LASIX) 20 MG tablet TAKE 1 TABLET EVERY DAY 03/21/16  Yes Historical Provider, MD  hydrocortisone-pramoxine (ANALPRAM HC) 2.5-1 % rectal cream Place 1 application rectally 3 (three) times daily as needed. 05/22/16  Yes Jerene Bears, MD  magnesium oxide (MAG-OX) 400 (241.3 MG) MG tablet Take 1 tablet (400 mg total) by mouth 2 (two) times daily. 06/04/15  Yes [name], MD  methocarbamol (ROBAXIN) 500 MG tablet Take 1 tablet (500 mg total) by mouth 3 (three) times daily. 04/09/16   Yes Milinda Pointer, MD  oxyCODONE (OXY IR/ROXICODONE) 5 MG immediate release tablet Take 1 tablet (5 mg total) by mouth every 8 (eight) hours as needed for severe pain. 04/09/16  Yes Milinda Pointer, MD  Potassium 99 MG TABS Take 99 mg by mouth daily.   Yes Historical Provider, MD  temazepam (RESTORIL) 15 MG capsule Take 15 mg by mouth  at bedtime as needed for sleep.   Yes Historical Provider, MD  vancomycin (VANCOCIN) 50 mg/mL oral solution Take 375 mg by mouth daily.   Yes Historical Provider, MD  warfarin (COUMADIN) 5 MG tablet Take 5-10 mg by mouth See admin instructions. Alternate taking 5 mg one day the 7.5 mg the other day Take 10 mg on 06/24/16   Yes Historical Provider, MD  cholestyramine Lucrezia Starch) 4 GM/DOSE powder Take 1 packet (4 g total) by mouth as needed. Patient not taking: Reported on 06/25/2016 05/22/16   Jerene Bears, MD  Glucose Blood (BLOOD GLUCOSE TEST STRIPS) STRP Use as instructed 08/30/15 08/29/16  Historical Provider, MD  potassium chloride (K-DUR) 10 MEQ tablet Take 2 tablets (20 mEq total) by mouth 2 (two) times daily. Patient not taking: Reported on 06/25/2016 02/15/15   Ivan Anchors Love, PA-C  vancomycin (VANCOCIN) 125 MG capsule Take 1 tablet QID for 14 days Take 1 tablet BID for 7 days Take 1 tablet daily for 7 days Take one tablet every other day X 7 doses Patient not taking: Reported on 06/25/2016 05/30/16   Jerene Bears, MD  warfarin (COUMADIN) 5 MG tablet TAKE 1 TO 1 AND 1/2 TABLETS DAILY AS DIRECTED BY COUMADIN CLINIC Patient not taking: Reported on 06/25/2016 04/30/16   [name], MD    Family History Family History  Problem Relation Age of Onset  . Heart disease Mother   . Throat cancer Mother   . Diabetes Father   . Stroke Other   . Hypertension Other   . Cancer Maternal Aunt     lung, brain  . Cancer Maternal Uncle     brain aneurysm    Social History Social History  Substance Use Topics  . Smoking status: Light Tobacco Smoker    Types:  Cigars  . Smokeless tobacco: Never Used     Comment: about 3 yearly- cigar  . Alcohol use 0.0 oz/week     Comment: social     Allergies   Penicillins; Adhesive [tape]; and Bactrim [sulfamethoxazole-trimethoprim]   Review of Systems Review of Systems  Constitutional: Negative for chills and fever.  HENT: Negative for congestion.   Eyes: Negative for visual disturbance.  Respiratory: Negative for cough and shortness of breath.   Cardiovascular: Positive for leg swelling. Negative for chest pain.  Gastrointestinal: Negative for abdominal pain.  Genitourinary: Positive for scrotal swelling. Negative for dysuria, penile pain, penile swelling and testicular pain.  Musculoskeletal: Negative for back pain.  Skin: Negative for color change.  Neurological: Negative for headaches.     Physical Exam Updated Vital Signs BP 122/61   Pulse 77   Temp 98.7 F (37.1 C) (Oral)   Resp 23   Ht 5\' 1"  (1.549 m)   Wt 102.5 kg   SpO2 99%   BMI 42.70 kg/m   Physical Exam  Constitutional: He is oriented to person, place, and time. He appears well-developed and well-nourished. No distress.  HENT:  Head: Normocephalic and atraumatic.  Cardiovascular: Intact distal pulses.   Irregularly irregular. Hx of afib.   Pulmonary/Chest: Effort normal and breath sounds normal. No respiratory distress. He has no wheezes. He has no rales.  Abdominal: Soft. He exhibits no distension. There is no tenderness.  Genitourinary:  Genitourinary Comments: Scrotum with generalized swelling but nontender. One testicle 2/2 surgical removal. No lesions, erythema or warmth to the touch.   Musculoskeletal: He exhibits edema.  Bilateral lower extremity edema. No erythema or warmth to the touch. No  open wounds.   Neurological: He is alert and oriented to person, place, and time.  Skin: Skin is warm and dry.  Nursing note and vitals reviewed.   ED Treatments / Results  Labs (all labs ordered are listed, but only  abnormal results are displayed) Labs Reviewed  BASIC METABOLIC PANEL - Abnormal; Notable for the following:       Result Value   Calcium 8.0 (*)    All other components within normal limits  CBC - Abnormal; Notable for the following:    Hemoglobin 8.6 (*)    HCT 31.5 (*)    MCV 65.1 (*)    MCH 17.8 (*)    MCHC 27.3 (*)    RDW 20.0 (*)    All other components within normal limits  BRAIN NATRIURETIC PEPTIDE - Abnormal; Notable for the following:    B Natriuretic Peptide 175.3 (*)    All other components within normal limits  URINALYSIS, ROUTINE W REFLEX MICROSCOPIC (NOT AT [facility])    EKG  EKG Interpretation  Date/Time:  Wednesday June 25 2016 05:24:29 EDT Ventricular Rate:  76 PR Interval:    QRS Duration: 123 QT Interval:  390 QTC Calculation: 439 R Axis:   -24 Text Interpretation:  Atrial fibrillation Ventricular premature complex Left bundle branch block No significant change since last tracing Confirmed by Glynn Octave [phone]) on 06/25/2016 6:27:28 AM       Radiology US Scrotum  Result Date: 06/25/2016 CLINICAL DATA:  Scrotal swelling. History of left orchiectomy age 30 for cancer. EXAM: SCROTAL ULTRASOUND DOPPLER ULTRASOUND OF THE TESTICLES TECHNIQUE: Complete ultrasound examination of the testicles, epididymis, and other scrotal structures was performed. Color and spectral Doppler ultrasound were also utilized to evaluate blood flow to the testicles. COMPARISON:  None. FINDINGS: Right testicle Measurements: 3.4 x 1.9 x 2.8 cm. No mass or microlithiasis visualized. Left testicle Measurements: . Surgically absent left testicle. No mass in the left scrotum. Right epididymis:  Normal in size and appearance. Left epididymis:  Surgically absent Hydrocele:  Mild right hydrocele Varicocele:  Right-sided varicocele. Diffuse thickening of the scrotum compatible with edema. Pulsed Doppler interrogation of both testes demonstrates normal low resistance arterial and venous  waveforms bilaterally. IMPRESSION: Diffuse scrotal skin thickening compatible with edema. Right testicle normal.  Right hydrocele and right varicocele Left scrotum surgically absent. Electronically Signed   By: Franchot Gallo M.D.   On: 06/25/2016 07:48   [place] Art/ven Flow Abd Pelv Doppler  Result Date: 06/25/2016 CLINICAL DATA:  Scrotal swelling. History of left orchiectomy age 10 for cancer. EXAM: SCROTAL ULTRASOUND DOPPLER ULTRASOUND OF THE TESTICLES TECHNIQUE: Complete ultrasound examination of the testicles, epididymis, and other scrotal structures was performed. Color and spectral Doppler ultrasound were also utilized to evaluate blood flow to the testicles. COMPARISON:  None. FINDINGS: Right testicle Measurements: 3.4 x 1.9 x 2.8 cm. No mass or microlithiasis visualized. Left testicle Measurements: . Surgically absent left testicle. No mass in the left scrotum. Right epididymis:  Normal in size and appearance. Left epididymis:  Surgically absent Hydrocele:  Mild right hydrocele Varicocele:  Right-sided varicocele. Diffuse thickening of the scrotum compatible with edema. Pulsed Doppler interrogation of both testes demonstrates normal low resistance arterial and venous waveforms bilaterally. IMPRESSION: Diffuse scrotal skin thickening compatible with edema. Right testicle normal.  Right hydrocele and right varicocele Left scrotum surgically absent. Electronically Signed   By: Franchot Gallo M.D.   On: 06/25/2016 07:48    Procedures Procedures (including critical care time)  Medications  Ordered in ED Medications - No data to display   Initial Impression / Assessment and Plan / ED Course  I have reviewed the triage vital signs and the nursing notes.  Pertinent labs & imaging results that were available during my care of the patient were reviewed by me and considered in my medical decision making (see chart for details).  Clinical Course   Cassidy Diedrich is a 55 y.o. male who presents to ED for  worsening scrotal swelling x 2 weeks. On exam, he is afebrile and hemodynamically stable. He does have significant edema to the lateral lower extremities which she states is baseline. He has scrotal swelling, but no tenderness or warmth. Appears more consistent with edema. Lab work reviewed and baseline. Ultrasound was obtained which shows diffuse scrotal skin thickening compatible with edema. Discussed findings with patient. He already is followed by primary care, cardiology and urology. He has an appointment with cardiology tomorrow and I encouraged him to keep this scheduled appointment. I also encouraged him to follow up with his PCP and urology as well. Continue taking Lasix. Reasons to return to ED discussed and all questions answered.  Patient discussed with Dr. Rogene Houston who agrees with treatment plan.   Final Clinical Impressions(s) / ED Diagnoses   Final diagnoses:  Scrotal swelling  Lower extremity edema    New Prescriptions New Prescriptions   No medications on file     [place], PA-C 06/25/16 [id number]    Fredia Sorrow, MD 06/25/16 1028

## 2016-06-25 NOTE — ED Notes (Signed)
Pt has been placed on enteric precautions, dx with C.Diff a few weeks ago, still in process of finishing up abx

## 2016-06-25 NOTE — Discharge Instructions (Signed)
Continue taking Lasix as directed. Please follow up with your primary physician and your urologist for further management of your condition. Return to the ER for new or worsening symptoms, any additional concerns.

## 2016-06-27 NOTE — Telephone Encounter (Signed)
Not sure how you want to note this.  His colonoscopy isn't until early January.

## 2016-06-27 NOTE — Telephone Encounter (Signed)
Noted on his next Coumadin appt, thanks.

## 2016-06-29 ENCOUNTER — Telehealth: Payer: Self-pay | Admitting: Physician Assistant

## 2016-06-29 NOTE — Telephone Encounter (Signed)
Patient called after hour answering service as he says he was seen in the ED for fluid accumulation, he says his weight has increased from 130 up to 146 recently, however his ED visit a few days ago actually says his weight is down. He is also concerned about dermatitis. Given conflicting picture, I have send staff message to arrange outpatient visit as soon as possible.   Lance Corrigan PA Pager: [phone]

## 2016-06-30 ENCOUNTER — Telehealth (HOSPITAL_COMMUNITY): Payer: Self-pay | Admitting: Cardiology

## 2016-06-30 ENCOUNTER — Ambulatory Visit (INDEPENDENT_AMBULATORY_CARE_PROVIDER_SITE_OTHER): Payer: Medicare HMO | Admitting: Pharmacist

## 2016-06-30 DIAGNOSIS — I482 Chronic atrial fibrillation, unspecified: Secondary | ICD-10-CM

## 2016-06-30 DIAGNOSIS — Z5181 Encounter for therapeutic drug level monitoring: Secondary | ICD-10-CM | POA: Diagnosis not present

## 2016-06-30 DIAGNOSIS — I481 Persistent atrial fibrillation: Secondary | ICD-10-CM | POA: Diagnosis not present

## 2016-06-30 DIAGNOSIS — I4819 Other persistent atrial fibrillation: Secondary | ICD-10-CM

## 2016-06-30 DIAGNOSIS — Z952 Presence of prosthetic heart valve: Secondary | ICD-10-CM | POA: Diagnosis not present

## 2016-06-30 LAB — POCT INR: INR: 3

## 2016-06-30 NOTE — Telephone Encounter (Signed)
Spoke to patient. He was seen on Oct 25th in ED for symptoms of fluid retention w/ distribution in lower extremities, abdomen and around genitals.   Notes he takes 20mg  BID of furosemide. He was advised yesterday to double this dose. Unclear if this has helped. Patient notes he gets SOB on ambulation and w/ reclining - not new symptoms.  Notes he's "about the same, no better, no worse" today than he was when in ED. Pt expresses that he isn't sure he needs to come in, but at minimum hoped Dr. [name] could review ED notes from last week to see if he is in agreement w their assessment.  Pt aware I will route for provider review and follow up w him. No upcoming open slots on Dr. Rosezella [place] schedule - patient amenable to being seen by APP.

## 2016-06-30 NOTE — Telephone Encounter (Signed)
New Message:  Pt called in stating that he feels that he is going into CHF. He voiced that he is retaining fluid and having some SOB. He would like to be seen Dr. [name] if possible. I informed him that I would send a message to the triage nurse and that the nurse would be contacting him to advise.   Thanks

## 2016-07-09 ENCOUNTER — Encounter: Payer: Self-pay | Admitting: Pain Medicine

## 2016-07-10 ENCOUNTER — Encounter: Payer: Self-pay | Admitting: Pain Medicine

## 2016-07-10 ENCOUNTER — Telehealth: Payer: Self-pay | Admitting: Cardiology

## 2016-07-10 DIAGNOSIS — G894 Chronic pain syndrome: Secondary | ICD-10-CM | POA: Insufficient documentation

## 2016-07-10 NOTE — Progress Notes (Deleted)
Patient's Name: Lance Cook  MRN: [id number]  Referring Provider: [name], PA-C  DOB: 1960/09/02  PCP: Lance Dials, PA-C  DOS: 07/10/2016  Note by: Lance Brunswick. Dossie Arbour, MD  Service setting: Ambulatory outpatient  Specialty: Interventional Pain Management  Location: [facility] (AMB) Pain Management Facility    Patient type: Established   Primary Reason(s) for Visit: Encounter for prescription drug management (Level of risk: moderate) CC: No chief complaint on file.  HPI  Lance Cook is a 55 y.o. year old, male patient, who comes today for a medication management evaluation. 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; Ascending aortic dissection ([place]); Chronic diastolic congestive heart failure (HCC); 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; Enteritis due to Clostridium difficile; Hypomagnesemia; Physical deconditioning; Long term current use of opiate analgesic; Long term prescription opiate use; Opiate use (22.5 MME/day); Opiate dependence ([place]); Encounter for therapeutic drug level monitoring; Chronic low back pain (midline); Lumbar facet syndrome; Personal history of transient ischemic attack (TIA), and cerebral infarction without residual deficits; ED (erectile dysfunction) of organic origin; Eunuchoidism; Adult BMI 30+; Obstructive apnea; Testicular hypofunction; Lumbar canal stenosis; Malignant neoplasm of testis ([place]); Type 2 diabetes  mellitus ([place]); Chronic lower extremity pain (Left); Personal history of malignant neoplasm of testis; Musculoskeletal pain; History of colonic polyps; Chronic diarrhea; Benign neoplasm of transverse colon; Scrotal swelling; and Chronic pain syndrome on his problem list. His primarily concern today is the No chief complaint on file.  Pain Assessment: Self-Reported Pain Score:  /10             Reported level is compatible with observation.          Lance Cook was last seen on 04/09/2016 for medication management. During today's appointment we reviewed Lance Cook's chronic pain status, as well as his outpatient medication regimen.  The patient  reports that he does not use drugs. His body mass index is unknown because there is no height or weight on file.  Further details on both, my assessment(s), as well as the proposed treatment plan, please see below.  Controlled Substance Pharmacotherapy Assessment REMS (Risk Evaluation and Mitigation Strategy)  Analgesic:Oxycodone IR 64m q8hrs (15 mg/day of oxycodone) MME/day:22.5 mg/day No notes on file Pharmacokinetics: Liberation and absorption (onset of action): WNL Distribution (time to peak effect): WNL Metabolism and excretion (duration of action): WNL         Pharmacodynamics: Desired effects: Analgesia: The patient reports >50% benefit. Reported improvement in function: The patient reports medication allows him to accomplish basic ADLs. Clinically meaningful improvement in function (CMIF): Sustained CMIF goals met Perceived effectiveness: Described as relatively effective, allowing for increase in activities of daily living (ADL) Undesirable effects: Side-effects or Adverse reactions: None reported Monitoring: [place] PMP: Online review of the past 115-montheriod conducted. Compliant with practice rules and regulations List of all UDS test(s) done:  Lab Results  Component Value Date   TOXASSSELUR FINAL 01/07/2016   TOXASSSELUR FINAL  10/09/2015   TOXASSSELUR FINAL 07/11/2015   Last UDS on record: ToxAssure Select 13  Date Value Ref Range Status  01/07/2016  FINAL  Final    Comment:    ==================================================================== TOXASSURE SELECT 13 (MW) ==================================================================== Test                             Result       Flag       Units Drug Present and Declared for Prescription Verification   Oxazepam                       782          EXPECTED   ng/mg creat   Temazepam                      10809        EXPECTED   ng/mg creat    Oxazepam and temazepam are expected metabolites of diazepam.    Oxazepam is also an expected metabolite of other benzodiazepine    drugs, including chlordiazepoxide, prazepam, clorazepate,    halazepam, and temazepam.  Oxazepam and temazepam are available    as scheduled prescription medications.   Oxycodone                      1568         EXPECTED   ng/mg creat   Oxymorphone                    305          EXPECTED   ng/mg creat   Noroxycodone                   5650         EXPECTED   ng/mg creat    Sources of oxycodone include scheduled prescription medications.    Oxymorphone and noroxycodone are expected metabolites of    oxycodone. Oxymorphone is also available as a scheduled    prescription medication. ==================================================================== Test                      Result    Flag   Units      Ref Range   Creatinine              22               mg/dL      >=20 ==================================================================== Declared Medications:  The flagging and interpretation on this report are based on the  following declared medications.  Unexpected results may arise from  inaccuracies in the declared medications.  **Note: The testing scope of this panel includes these medications:  Oxycodone (Oxy-IR)  Temazepam (Restoril)  **Note: The testing scope of this panel does  not include following  reported medications:  Allopurinol (Zyloprim)  Atenolol (Tenormin)  Calcium (Calcium/Magnesium/Zinc)  Clindamycin (Cleocin)  Furosemide (Lasix)  Magnesium (Calcium/Magnesium/Zinc)  Magnesium (Mag-Ox)  Methocarbamol (Robaxin)  Potassium (K-Dur)  Topical  Topical (Silvadene)  Vitamin B  Warfarin (Coumadin)  Zinc (Calcium/Magnesium/Zinc) ==================================================================== For clinical consultation, please call [phone]. ====================================================================    UDS interpretation: Compliant          Medication Assessment Form: Reviewed. Patient indicates being compliant with therapy Treatment compliance: Compliant Risk Assessment Profile: Aberrant behavior: See prior evaluations. None observed or detected today Comorbid factors increasing risk of overdose: See prior notes. No additional risks detected today Risk of substance use disorder (SUD): Low Opioid Risk Tool (ORT) Total Score:  Interpretation Table:  Score <3 = Low Risk for SUD  Score between 4-7 = Moderate Risk for SUD  Score >8 = High Risk for Opioid Abuse   Risk Mitigation Strategies:  Patient Counseling: Covered Patient-Prescriber Agreement (PPA): Present and active  Notification to other healthcare providers: Done  Pharmacologic Plan: No change in therapy, at this time  Laboratory Chemistry  Inflammation Markers Lab Results  Component Value Date   ESRSEDRATE 9 01/14/2016   CRP 0.9 10/09/2015   Renal Function Lab Results  Component Value Date   BUN 17 06/25/2016   CREATININE 0.94 06/25/2016   GFRAA >60 06/25/2016   GFRNONAA >60 06/25/2016   Hepatic Function Lab Results  Component Value Date   AST 21 02/05/2016   ALT 19 02/05/2016   ALBUMIN 2.6 (L) 02/05/2016   Electrolytes Lab Results  Component Value Date   NA 136 06/25/2016   K 4.2 06/25/2016   CL 101 06/25/2016   CALCIUM 8.0 (L) 06/25/2016    MG 1.8 10/09/2015   Pain Modulating Vitamins Lab Results  Component Value Date   VITAMINB12 371 01/07/2012   Coagulation Parameters Lab Results  Component Value Date   INR 3.0 06/30/2016   LABPROT 39.5 (H) 02/05/2016   APTT 41 (H) 01/24/2015   PLT 168 06/25/2016   Cardiovascular Lab Results  Component Value Date   BNP 175.3 (H) 06/25/2016   HGB 8.6 (L) 06/25/2016   HCT 31.5 (L) 06/25/2016   Note: Lab results reviewed.  Recent Diagnostic Imaging Review  US Scrotum  Result Date: 06/25/2016 CLINICAL DATA:  Scrotal swelling. History of left orchiectomy age 31 for cancer. EXAM: SCROTAL ULTRASOUND DOPPLER ULTRASOUND OF THE TESTICLES TECHNIQUE: Complete ultrasound examination of the testicles, epididymis, and other scrotal structures was performed. Color and spectral Doppler ultrasound were also utilized to evaluate blood flow to the testicles. COMPARISON:  None. FINDINGS: Right testicle Measurements: 3.4 x 1.9 x 2.8 cm. No mass or microlithiasis visualized. Left testicle Measurements: . Surgically absent left testicle. No mass in the left scrotum. Right epididymis:  Normal in size and appearance. Left epididymis:  Surgically absent Hydrocele:  Mild right hydrocele Varicocele:  Right-sided varicocele. Diffuse thickening of the scrotum compatible with edema. Pulsed Doppler interrogation of both testes demonstrates normal low resistance arterial and venous waveforms bilaterally. IMPRESSION: Diffuse scrotal skin thickening compatible with edema. Right testicle normal.  Right hydrocele and right varicocele Left scrotum surgically absent. Electronically Signed   By: Franchot Gallo M.D.   On: 06/25/2016 07:48   [place] Art/ven Flow Abd Pelv Doppler  Result Date: 06/25/2016 CLINICAL DATA:  Scrotal swelling. History of left orchiectomy age 49 for cancer. EXAM: SCROTAL ULTRASOUND DOPPLER ULTRASOUND OF THE TESTICLES TECHNIQUE: Complete ultrasound examination of the testicles, epididymis, and other scrotal  structures was performed. Color and spectral Doppler ultrasound were also utilized to evaluate blood flow to the testicles. COMPARISON:  None. FINDINGS: Right testicle Measurements: 3.4 x 1.9 x 2.8 cm. No mass or microlithiasis visualized. Left testicle Measurements: . Surgically absent left testicle. No mass in the left scrotum. Right epididymis:  Normal in size and appearance. Left epididymis:  Surgically absent Hydrocele:  Mild right hydrocele Varicocele:  Right-sided varicocele. Diffuse thickening of the scrotum compatible with edema. Pulsed Doppler interrogation of both testes demonstrates normal low resistance arterial and venous waveforms bilaterally. IMPRESSION: Diffuse scrotal skin thickening compatible with edema. Right testicle normal.  Right hydrocele and right varicocele Left scrotum surgically absent. Electronically Signed   By: Franchot Gallo M.D.  On: 06/25/2016 07:48   Note: Imaging results reviewed.  Meds  The patient has a current medication list which includes the following prescription(s): acetaminophen, allopurinol, atenolol, b complex vitamins, bacitracin, calcium-magnesium-zinc, cholestyramine, clindamycin, digoxin, enoxaparin, furosemide, blood glucose test strips, hydrocortisone-pramoxine, magnesium oxide, methocarbamol, oxycodone, potassium, potassium chloride, temazepam, vancomycin, vancomycin, warfarin, and warfarin.  Current Outpatient Prescriptions on File Prior to Visit  Medication Sig  . acetaminophen (TYLENOL) 500 MG tablet Take 500 mg by mouth 2 (two) times daily.  Marland Kitchen allopurinol (ZYLOPRIM) 300 MG tablet Take 300 mg by mouth daily.  Marland Kitchen atenolol (TENORMIN) 100 MG tablet Take 0.5 tablets (50 mg total) by mouth 2 (two) times daily.  Marland Kitchen b complex vitamins tablet Take 1 tablet by mouth daily.    . bacitracin 500 UNIT/GM ointment Apply 1 application topically as needed for wound care.   . Calcium-Magnesium-Zinc (CAL-MAG-ZINC PO) Take 1 tablet by mouth daily.   .  cholestyramine (QUESTRAN) 4 GM/DOSE powder Take 1 packet (4 g total) by mouth as needed. (Patient not taking: Reported on 06/25/2016)  . clindamycin (CLEOCIN) 300 MG capsule Take 300 mg by mouth as needed (for dental appointments).   . digoxin (LANOXIN) 0.25 MG tablet TAKE 1 TABLET EVERY DAY  . enoxaparin (LOVENOX) 100 MG/ML injection Inject 1 mL (100 mg total) into the skin every 12 (twelve) hours. (Patient taking differently: Inject 100 mg into the skin every 12 (twelve) hours. 06-11-16 Pt has instructions to stop Coumadin and bridge with Lovenox with last dose to be AM day before procedure.)  . furosemide (LASIX) 20 MG tablet TAKE 1 TABLET EVERY DAY  . Glucose Blood (BLOOD GLUCOSE TEST STRIPS) STRP Use as instructed  . hydrocortisone-pramoxine (ANALPRAM HC) 2.5-1 % rectal cream Place 1 application rectally 3 (three) times daily as needed.  . magnesium oxide (MAG-OX) 400 (241.3 MG) MG tablet Take 1 tablet (400 mg total) by mouth 2 (two) times daily.  . methocarbamol (ROBAXIN) 500 MG tablet Take 1 tablet (500 mg total) by mouth 3 (three) times daily.  Marland Kitchen oxyCODONE (OXY IR/ROXICODONE) 5 MG immediate release tablet Take 1 tablet (5 mg total) by mouth every 8 (eight) hours as needed for severe pain.  Marland Kitchen Potassium 99 MG TABS Take 99 mg by mouth daily.  . potassium chloride (K-DUR) 10 MEQ tablet Take 2 tablets (20 mEq total) by mouth 2 (two) times daily. (Patient not taking: Reported on 06/25/2016)  . temazepam (RESTORIL) 15 MG capsule Take 15 mg by mouth at bedtime as needed for sleep.  . vancomycin (VANCOCIN) 125 MG capsule Take 1 tablet QID for 14 days Take 1 tablet BID for 7 days Take 1 tablet daily for 7 days Take one tablet every other day X 7 doses (Patient not taking: Reported on 06/25/2016)  . vancomycin (VANCOCIN) 50 mg/mL oral solution Take 375 mg by mouth daily.  Marland Kitchen warfarin (COUMADIN) 5 MG tablet TAKE 1 TO 1 AND 1/2 TABLETS DAILY AS DIRECTED BY COUMADIN CLINIC (Patient not taking: Reported on  06/25/2016)  . warfarin (COUMADIN) 5 MG tablet Take 5-10 mg by mouth See admin instructions. Alternate taking 5 mg one day the 7.5 mg the other day Take 10 mg on 06/24/16   No current facility-administered medications on file prior to visit.    ROS  Constitutional: Denies any fever or chills Gastrointestinal: No reported hemesis, hematochezia, vomiting, or acute GI distress Musculoskeletal: Denies any acute onset joint swelling, redness, loss of ROM, or weakness Neurological: No reported episodes of acute onset apraxia,  aphasia, dysarthria, agnosia, amnesia, paralysis, loss of coordination, or loss of consciousness  Allergies  Mr. Veasey is allergic to penicillins; adhesive [tape]; and bactrim [sulfamethoxazole-trimethoprim].  [place]  Drug: Mr. Stirewalt  reports that he does not use drugs. Alcohol:  reports that he drinks alcohol. Tobacco:  reports that he has been smoking Cigars.  He has never used smokeless tobacco. Medical:  has a past medical history of Acute respiratory failure with hypoxemia ([place]); Anemia; Anxiety; Aortic aneurysm ([place]); Aortic aneurysm and dissection (HCC); ARDS (adult respiratory distress syndrome) ([place]) (01/27/2015); Arthritis; Ascending aortic dissection ([place]) (07/14/2008); Asymptomatic chronic venous hypertension (01/15/2010); Atrial fibrillation ([place]); Bell's palsy; CAD (coronary artery disease); Cellulitis (04/02/2014); Cerebral artery occlusion with cerebral infarction [facility]) (10/08/2011); CHF (congestive heart failure) ([place]); Chronic diastolic congestive heart failure ([place]); Chronic diastolic heart failure ([place]) (03/01/2015); Chronic LBP (10/08/2011); CVA (cerebral vascular accident) ([place]) (10/08/2011); Diabetes mellitus; DIABETES MELLITUS, TYPE II (11/01/2009); Diverticulosis; Dysrhythmia; 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); Encephalopathy acute (02/05/2015); Erectile dysfunction (10/08/2011); GERD (gastroesophageal reflux disease); Gout; H/O mechanical aortic valve  replacement (11/01/2009); Hiatal hernia; History of adenomatous polyp of colon (12/23/2011); History of colon polyps (12/23/2011); Hyperlipidemia; Hypertension; Hypogonadism male (04/08/2012); Impaired glucose tolerance (10/08/2011); Leg pain (06/28/2010); Morbid obesity ([place]) (04/02/2014); Myocardial infarction (age 45); Obesity; OSA (obstructive sleep apnea); Peripheral vascular disease ([place]); Reflux (11/06/09); 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 (0/56/9794); 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 Family: family history includes Cancer in his maternal aunt and maternal uncle; Diabetes in his father; Heart disease in his mother; Hypertension in his other; Stroke in his other; Throat cancer in his mother.  Past Surgical History:  Procedure Laterality Date  . aortic truck    . BENTALL PROCEDURE  1988   25 mm St Jude mechanical valve conduit - Dr Blase Mess at [facility] in [place], [place]  . CHOLECYSTECTOMY  2011  . COLONOSCOPY WITH PROPOFOL N/A 06/17/2016   Procedure: COLONOSCOPY WITH PROPOFOL;  Surgeon: Jerene Bears, MD;  Location: [facility] ENDOSCOPY;  Service: Gastroenterology;  Laterality: N/A;  . CORONARY ARTERY BYPASS GRAFT    . LASER ABLATION  03/06/2010   left leg  . MITRAL VALVE REPLACEMENT Right 01/24/2015   Procedure: Re-Operation, MINIMALLY INVASIVE MITRAL VALVE (MV) REPLACEMENT.;  Surgeon: Rexene Alberts, MD;  Location: [place];  Service: Open Heart Surgery;  Laterality: Right;  . ORCHIECTOMY  age 52   testicular cancer  . OTOPLASTY     bilateral, age 17  . TEE WITHOUT CARDIOVERSION N/A 10/11/2014   Procedure: TRANSESOPHAGEAL ECHOCARDIOGRAM (TEE);  Surgeon: Pixie Casino, MD;  Location[facility] END[facility]COPY;  Service:  Cardiovascular;  Laterality: N/A;  . TEE WITHOUT CARDIOVERSION N/A 01/24/2015   Procedure: TRANSESOPHAGEAL ECHOCARDIOGRAM (TEE);  Surgeon: Rexene Alberts, MD;  Location: [place];  Service: Open Heart Surgery;  Laterality: N/A;  . TONSILLECTOMY  1967  . TOOTH EXTRACTION  2016   Constitutional Exam  General appearance: Well nourished, well developed, and well hydrated. In no apparent acute distress There were no vitals filed for this visit. BMI Assessment: Estimated body mass index is 42.7 kg/m as calculated from the following:   Height as of 06/25/16: 5' 1"  (1.549 m).   Weight as of 06/25/16: 226 lb (102.5 kg).  BMI interpretation table: BMI level  Category Range association with higher incidence of chronic pain  <18 kg/m2 Underweight   18.5-24.9 kg/m2 Ideal body weight   25-29.9 kg/m2 Overweight Increased incidence by 20%  30-34.9 kg/m2 Obese (Class I) Increased incidence by 68%  35-39.9 kg/m2 Severe obesity (Class II) Increased incidence by 136%  >40 kg/m2 Extreme obesity (Class III) Increased incidence by 254%   BMI Readings from Last 4 Encounters:  06/25/16 42.70 kg/m  06/17/16 43.46 kg/m  05/08/16 43.50 kg/m  04/09/16 40.81 kg/m   Wt Readings from Last 4 Encounters:  06/25/16 226 lb (102.5 kg)  06/17/16 230 lb (104.3 kg)  05/08/16 230 lb 3.2 oz (104.4 kg)  04/09/16 216 lb (98 kg)  Psych/Mental status: Alert, oriented x 3 (person, place, & time) Eyes: PERLA Respiratory: No evidence of acute respiratory distress  Cervical Spine Exam  Inspection: No masses, redness, or swelling Alignment: Symmetrical Functional ROM: Unrestricted ROM Stability: No instability detected Muscle strength & Tone: Functionally intact Sensory: Unimpaired Palpation: Non-contributory  Upper Extremity (UE) Exam    Side: Right upper extremity  Side: Left upper extremity  Inspection: No masses, redness, swelling, or asymmetry  Inspection: No masses, redness, swelling, or asymmetry  Functional  ROM: Unrestricted ROM         Functional ROM: Unrestricted ROM          Muscle strength & Tone: Functionally intact  Muscle strength & Tone: Functionally intact  Sensory: Unimpaired  Sensory: Unimpaired  Palpation: Non-contributory  Palpation: Non-contributory   Thoracic Spine Exam  Inspection: No masses, redness, or swelling Alignment: Symmetrical Functional ROM: Unrestricted ROM Stability: No instability detected Sensory: Unimpaired Muscle strength & Tone: Functionally intact Palpation: Non-contributory  Lumbar Spine Exam  Inspection: No masses, redness, or swelling Alignment: Symmetrical Functional ROM: Unrestricted ROM Stability: No instability detected Muscle strength & Tone: Functionally intact Sensory: Unimpaired Palpation: Non-contributory Provocative Tests: Lumbar Hyperextension and rotation test: evaluation deferred today       Patrick's Maneuver: evaluation deferred today              Gait & Posture Assessment  Ambulation: Unassisted Gait: Relatively normal for age and body habitus Posture: WNL   Lower Extremity Exam    Side: Right lower extremity  Side: Left lower extremity  Inspection: No masses, redness, swelling, or asymmetry  Inspection: No masses, redness, swelling, or asymmetry  Functional ROM: Unrestricted ROM          Functional ROM: Unrestricted ROM          Muscle strength & Tone: Functionally intact  Muscle strength & Tone: Functionally intact  Sensory: Unimpaired  Sensory: Unimpaired  Palpation: Non-contributory  Palpation: Non-contributory   Assessment  Primary Diagnosis & Pertinent Problem List: The primary encounter diagnosis was Chronic low back pain (midline). Diagnoses of Chronic pain syndrome, Long term current use of anticoagulant therapy, Long term current use of opiate analgesic, Opiate use (22.5 MME/day), and Musculoskeletal pain were also pertinent to this visit.  Visit Diagnosis: 1. Chronic low back pain (midline)   2. Chronic pain  syndrome   3. Long term current use of anticoagulant therapy   4. Long term current use of opiate analgesic   5. Opiate use (22.5 MME/day)   6. Musculoskeletal pain    Plan of Care  Pharmacotherapy (Medications Ordered): No orders of the defined types were placed in this encounter.  New Prescriptions   No medications on file   Medications administered today: Mr. Mccorkel had no medications administered during this visit. Lab-work, procedure(s),  and/or referral(s): No orders of the defined types were placed in this encounter.  Imaging and/or referral(s): None  Interventional therapies: Planned, scheduled, and/or pending:   None at this time.    Considering:   Palliative bilateral lumbar facet blocks under fluoroscopic guidance and IV sedation.    Palliative PRN treatment(s):   None at this time.    Provider-requested follow-up: No Follow-up on file.  Future Appointments Date Time Provider [place]  07/10/2016 10:15 AM Milinda Pointer, MD [facility] None  07/21/2016 8:45 AM [facility]  08/15/2016 1:30 PM [facility] PREVISIT RM [facility]   Primary Care Physician: Lance Dials, PA-C Location[facility] Note by: Lance Cook, M.D, DABA, DABAPM, DABPM, DABIPP, FIPP  Pain Score Disclaimer: We use the NRS-11 scale. This is a self-reported, subjective measurement of pain severity with only modest accuracy. It is used primarily to identify changes within a particular patient. It must be understood that outpatient pain scales are significantly less accurate that those used for research, where they can be applied under ideal controlled circumstances with minimal exposure to variables. In reality, the score is likely to be a combination of pain intensity and pain affect, where pain affect describes the degree of emotional arousal or changes in action readiness caused by the sensory  experience of pain. Factors such as social and work situation, setting, emotional state, anxiety levels, expectation, and prior pain experience may influence pain perception and show large inter-individual differences that may also be affected by time variables.  Patient instructions provided during this appointment: There are no Patient Instructions on file for this visit.

## 2016-07-10 NOTE — Telephone Encounter (Signed)
New message  Pt was in ER  Lymphodema, pt has questions about diuretic   Please call pt back

## 2016-07-10 NOTE — Telephone Encounter (Signed)
Spoke with pt states that he went to the ER yesterday and they were concerned with spikes in his ECG and gave him 60mg  IV lasix. Pt went to the ER for increased weight of 30 pounds in 2 weeks and increased SOB they told him to go back to his regular dosing of the lasix 20mg  BID, pt would like to increase his dosages, I scheduled an appointment for 07-14-16, PA Meng and told pt to continue regular dosage of 20mg  bid until his appt. Please advise.

## 2016-07-11 NOTE — Telephone Encounter (Signed)
Agree 

## 2016-07-11 NOTE — Telephone Encounter (Signed)
Sounds good, will reassess on followup.

## 2016-07-14 ENCOUNTER — Ambulatory Visit (INDEPENDENT_AMBULATORY_CARE_PROVIDER_SITE_OTHER): Payer: Medicare HMO | Admitting: Student

## 2016-07-14 ENCOUNTER — Encounter: Payer: Self-pay | Admitting: Physician Assistant

## 2016-07-14 VITALS — BP 130/77 | HR 76 | Ht 61.0 in | Wt 239.2 lb

## 2016-07-14 DIAGNOSIS — I7101 Dissection of ascending aorta: Secondary | ICD-10-CM

## 2016-07-14 DIAGNOSIS — I890 Lymphedema, not elsewhere classified: Secondary | ICD-10-CM | POA: Diagnosis not present

## 2016-07-14 DIAGNOSIS — I5033 Acute on chronic diastolic (congestive) heart failure: Secondary | ICD-10-CM

## 2016-07-14 DIAGNOSIS — F329 Major depressive disorder, single episode, unspecified: Secondary | ICD-10-CM

## 2016-07-14 DIAGNOSIS — I10 Essential (primary) hypertension: Secondary | ICD-10-CM

## 2016-07-14 DIAGNOSIS — F32A Depression, unspecified: Secondary | ICD-10-CM

## 2016-07-14 DIAGNOSIS — I25810 Atherosclerosis of coronary artery bypass graft(s) without angina pectoris: Secondary | ICD-10-CM

## 2016-07-14 DIAGNOSIS — I340 Nonrheumatic mitral (valve) insufficiency: Secondary | ICD-10-CM

## 2016-07-14 DIAGNOSIS — Z954 Presence of other heart-valve replacement: Secondary | ICD-10-CM | POA: Diagnosis not present

## 2016-07-14 LAB — BASIC METABOLIC PANEL
BUN: 20 mg/dL (ref 7–25)
CALCIUM: 8.1 mg/dL — AB (ref 8.6–10.3)
CHLORIDE: 107 mmol/L (ref 98–110)
CO2: 34 mmol/L — AB (ref 20–31)
CREATININE: 0.85 mg/dL (ref 0.70–1.33)
GLUCOSE: 57 mg/dL — AB (ref 65–99)
Potassium: 4.5 mmol/L (ref 3.5–5.3)
Sodium: 141 mmol/L (ref 135–146)

## 2016-07-14 MED ORDER — FUROSEMIDE 20 MG PO TABS: ORAL_TABLET | ORAL | 3 refills | Status: DC

## 2016-07-14 NOTE — Patient Instructions (Addendum)
Medication Instructions:  INCREASE Lasix (Furosemide) Take 1 tablet (20mg ) in the morning and 2 tablets (40mg ) in the evening   Labwork: Your physician recommends that you return for lab work in: TODAY-BMET, BNP  Testing/Procedures: None   Follow-Up: Your physician recommends that you schedule a follow-up appointment in: [place].  Any Other Special Instructions Will Be Listed Below (If Applicable).     If you need a refill on your cardiac medications before your next appointment, please call your pharmacy.

## 2016-07-14 NOTE — Progress Notes (Signed)
Cardiology Office Note    Date:  07/14/2016   ID:  Lance Cook, DOB [birth year], MRN [id number]  PCP:  [name], PA-C  Cardiologist: Dr. [name]   Chief Complaint  Patient presents with  . Hospitalization Follow-up    History of Present Illness:    Lance Cook is a 55 y.o. male with past medical history of AVR (s/p Bentall procedure in 1988), severe mitral regurgitation (s/p St Jude mechanical valve placed in 12/2014), CAD (s/p SVG-RCA, known to be occluded by cath in 12/2014), chronic atrial fibrillation, chronic diastolic CHF, prior CVA, chronic venous insufficiency, OSA, and recurrent C. difficile infection who presents to the office today for Emergency Department follow-up.  Was last seen by Dr. [name] in 05/2016 and reported worsening dyspnea. Had been less actve as he was currently undergoing treatment for C.difficile. Reported a weight gain of 14 lbs (weight 230 lbs at time of office visit), however BNP was minimally elevated at 159.   He presented to the [facility] in [place] on 11/9 for evaluation or progressive dyspnea and chest pain. ProBNP was elevated to 1814. Troponin values were negative. He was given IV Lasix and instructed to increase his PO Lasix from 20mg  daily to 20mg  BID for the next 4 days.   In talking with the patient, he reports having worsening lower extremity edema from baseline for the past 2-3 weeks. He is followed by the Vascular Clinic at [facility] for his lymphedema and uses a compression machine 2 times per day to assist with his swelling, however he notes the swelling worsens in less than an hour after finishing his session. He was referred to the Wound Care clinic in [place] but is unable to financially afford this. He does report worsening dyspnea with exertion for the past week as well. Denies any orthopnea, PND, chest pain, or palpitations.   Lasix dosing was increased from 20mg  daily to 20mg  BID just 4 days ago, with his  weight being up 9 lbs from his office visit in 05/2016. Overall, he reports a 20+ lb weight gain in the past 3 months.    He spends a majority of today's visit talking about his chronic conditions. He mentions how difficult it is to balance his lymphedema including using the machine for 6+ hours a day while being a supporter of his wife who is legally blind and also raising their 44 year old son. He becomes tearful in talking about this. He denies any suicidal ideations but mentions life is "very difficult" on a daily basis.    Past Medical History:  Diagnosis Date  . Acute respiratory failure with hypoxemia ([place])   . Anemia   . Anxiety   . Aortic aneurysm ([place])   . Aortic aneurysm and dissection ([place])   . ARDS (adult respiratory distress syndrome) ([place]) 01/27/2015  . Arthritis   . Ascending aortic dissection ([place]) 07/14/2008   Localized dissection of ascending aorta noted on CTA in 2009 and stable on CTA in 2011  . Asymptomatic chronic venous hypertension 01/15/2010   Overview:  Overview:  Qualifier: Diagnosis of  By: [name] MD, [name]   Last Assessment & Plan:  I suggested he go to a vein clinic to see if he could be qualified compression hose of some type, since he is not a surgical candidate according to the vascular surgeons.   . Atrial fibrillation ([place])    chronic persistent  . Bell's palsy   . CAD (coronary artery disease)  Old scar inferior wall myoview, 10/2009 EF 52%.  He did have previous SVG to RCA but no obstructive disease noted on his most recent cath.  SVG occluded.   . Cellulitis 04/02/2014  . Cerebral artery occlusion with cerebral infarction ([place]) 10/08/2011   Overview:  Overview:  And hx of TIA prior to CABG, all thought due to systemic emboli prior to coumadin   . CHF (congestive heart failure) ([place])   . Chronic diastolic congestive heart failure ([place])   . Chronic diastolic heart failure ([place]) 03/01/2015  . Chronic LBP 10/08/2011  . CVA (cerebral vascular accident)  ([place]) 10/08/2011   And hx of TIA prior to CABG, all thought due to systemic emboli prior to coumadin   . Diabetes mellitus    diet controlled  . DIABETES MELLITUS, TYPE II 11/01/2009   Qualifier: Diagnosis of  By: [name] MD, [name]    . Diverticulosis   . Dysrhythmia    Dr. [name] follows-LOV 05-08-16 Epic.  . 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 Encephalopathy acute 02/05/2015  . Erectile dysfunction 10/08/2011  . GERD (gastroesophageal reflux disease)    not needing medication at thhis time- 01/22/15  . Gout   . H/O mechanical aortic valve replacement 11/01/2009   Qualifier: Diagnosis of  By: [name], [name]    . Hiatal hernia   . History of adenomatous polyp of colon 12/23/2011   Colonoscopy January 2010, 6 mm rectal tubulovillous adenoma. No high-grade dysplasia   . History of colon polyps 12/23/2011   Overview:  Overview:  Colonoscopy January 2010, 6 mm rectal tubulovillous adenoma. No high-grade dysplasia   . Hyperlipidemia   . Hypertension   . Hypogonadism male 04/08/2012  . Impaired glucose tolerance 10/08/2011  . Leg pain 06/28/2010  . Morbid obesity ([place]) 04/02/2014  . Myocardial infarction age 36  . Obesity   . OSA (obstructive sleep apnea)    CPAP  . Peripheral vascular disease ([place])   . Reflux 11/06/09  . 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 [id number]   33 mm St Jude bileaflet mechanical valve placed via right mini thoracotomy approach  . 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    1988 - Dr Blase Mess at [facility] in [place], [place]  . Severe mitral regurgitation 10/11/2014  . Shortness of breath dyspnea    with exertion  . Stroke [facility])    TIA and Stroke no residual effect- 1st was 29  . Testicular cancer ([place])   . TIA (transient ischemic attack)    age 6  .  Varicose veins     Past Surgical History:  Procedure Laterality Date  . aortic truck    . BENTALL PROCEDURE  1988   25 mm St Jude mechanical valve conduit - Dr Blase Mess at [facility] in [place], [place]  . CHOLECYSTECTOMY  2011  . COLONOSCOPY WITH PROPOFOL N/A 06/17/2016   Procedure: COLONOSCOPY WITH PROPOFOL;  Surgeon: Jerene Bears, MD;  Location: [facility] ENDOSCOPY;  Service: Gastroenterology;  Laterality: N/A;  . CORONARY ARTERY BYPASS GRAFT    . LASER ABLATION  03/06/2010   left leg  . MITRAL VALVE REPLACEMENT Right 01/24/2015   Procedure: Re-Operation, MINIMALLY INVASIVE MITRAL VALVE (MV) REPLACEMENT.;  Surgeon: Rexene Alberts, MD;  Location:  [facility] OR;  Service: Open Heart Surgery;  Laterality: Right;  . ORCHIECTOMY  age 38   testicular cancer  . OTOPLASTY     bilateral, age 19  . TEE WITHOUT CARDIOVERSION N/A 10/11/2014   Procedure: TRANSESOPHAGEAL ECHOCARDIOGRAM (TEE);  Surgeon: Pixie Casino, MD;  Location: [facility] ENDOSCOPY;  Service: Cardiovascular;  Laterality: N/A;  . TEE WITHOUT CARDIOVERSION N/A 01/24/2015   Procedure: TRANSESOPHAGEAL ECHOCARDIOGRAM (TEE);  Surgeon: Rexene Alberts, MD;  Location: [facility];  Service: Open Heart Surgery;  Laterality: N/A;  . TONSILLECTOMY  1967  . TOOTH EXTRACTION  2016    Current Medications: Outpatient Medications Prior to Visit  Medication Sig Dispense Refill  . acetaminophen (TYLENOL) 500 MG tablet Take 500 mg by mouth 2 (two) times daily.    Marland Kitchen allopurinol (ZYLOPRIM) 300 MG tablet Take 300 mg by mouth daily.    Marland Kitchen atenolol (TENORMIN) 100 MG tablet Take 0.5 tablets (50 mg total) by mouth 2 (two) times daily.    Marland Kitchen b complex vitamins tablet Take 1 tablet by mouth daily.      . bacitracin 500 UNIT/GM ointment Apply 1 application topically as needed for wound care.     . Calcium-Magnesium-Zinc (CAL-MAG-ZINC PO) Take 1 tablet by mouth daily.     . cholestyramine (QUESTRAN) 4 GM/DOSE powder Take 1  packet (4 g total) by mouth as needed. 1134 g 0  . clindamycin (CLEOCIN) 300 MG capsule Take 300 mg by mouth as needed (for dental appointments).     . digoxin (LANOXIN) 0.25 MG tablet TAKE 1 TABLET EVERY DAY 90 tablet 2  . enoxaparin (LOVENOX) 100 MG/ML injection Inject 1 mL (100 mg total) into the skin every 12 (twelve) hours. (Patient taking differently: Inject 100 mg into the skin every 12 (twelve) hours. 06-11-16 Pt has instructions to stop Coumadin and bridge with Lovenox with last dose to be AM day before procedure.) 20 Syringe 0  . Glucose Blood (BLOOD GLUCOSE TEST STRIPS) STRP Use as instructed    . hydrocortisone-pramoxine (ANALPRAM HC) 2.5-1 % rectal cream Place 1 application rectally 3 (three) times daily as needed. 90 g 0  . magnesium oxide (MAG-OX) 400 (241.3 MG) MG tablet Take 1 tablet (400 mg total) by mouth 2 (two) times daily. 180 tablet 3  . methocarbamol (ROBAXIN) 500 MG tablet Take 1 tablet (500 mg total) by mouth 3 (three) times daily. 90 tablet 2  . oxyCODONE (OXY IR/ROXICODONE) 5 MG immediate release tablet Take 1 tablet (5 mg total) by mouth every 8 (eight) hours as needed for severe pain. 90 tablet 0  . Potassium 99 MG TABS Take 99 mg by mouth daily.    . potassium chloride (K-DUR) 10 MEQ tablet Take 2 tablets (20 mEq total) by mouth 2 (two) times daily. 120 tablet 1  . temazepam (RESTORIL) 15 MG capsule Take 15 mg by mouth at bedtime as needed for sleep.    . vancomycin (VANCOCIN) 125 MG capsule Take 1 tablet QID for 14 days Take 1 tablet BID for 7 days Take 1 tablet daily for 7 days Take one tablet every other day X 7 doses 84 capsule 0  . vancomycin (VANCOCIN) 50 mg/mL oral solution Take 375 mg by mouth daily.    Marland Kitchen warfarin (COUMADIN) 5 MG tablet TAKE 1 TO 1 AND 1/2 TABLETS DAILY AS DIRECTED BY COUMADIN CLINIC 135 tablet 0  . warfarin (COUMADIN) 5 MG tablet Take 5-10 mg by mouth See admin instructions. Alternate taking 5 mg one day  the 7.5 mg the other day Take 10 mg  on 06/24/16    . furosemide (LASIX) 20 MG tablet TAKE 1 TABLET EVERY DAY     No facility-administered medications prior to visit.      Allergies:   Penicillins; Adhesive [tape]; and Bactrim [sulfamethoxazole-trimethoprim]   Social History   Social History  . Marital status: Married    Spouse name: N/A  . Number of children: 0  . Years of education: N/A   Occupational History  . Disabled Optician Unemployed   Social History Main Topics  . Smoking status: Light Tobacco Smoker    Types: Cigars  . Smokeless tobacco: Never Used     Comment: about 3 yearly- cigar  . Alcohol use 0.0 oz/week     Comment: social  . Drug use: No  . Sexual activity: Not Asked   Other Topics Concern  . None   Social History Narrative  . None     Family History:  The patient's family history includes Cancer in his maternal aunt and maternal uncle; Diabetes in his father; Heart disease in his mother; Hypertension in his other; Stroke in his other; Throat cancer in his mother.   Review of Systems:   Please see the history of present illness.     General:  No chills, fever, night sweats or weight changes.  Cardiovascular:  No chest pain, orthopnea, palpitations, paroxysmal nocturnal dyspnea. Positive for edema and dyspnea on exertion.  Dermatological: No rash, lesions/masses Respiratory: No cough, dyspnea Urologic: No hematuria, dysuria Abdominal:   No nausea, vomiting, diarrhea, bright red blood per rectum, melena, or hematemesis Neurologic:  No visual changes, wkns, changes in mental status. All other systems reviewed and are otherwise negative except as noted above.   Physical Exam:    VS:  BP 130/77   Pulse 76   Ht 5\' 1"  (1.549 m)   Wt 239 lb 3.2 oz (108.5 kg)   SpO2 100%   BMI 45.20 kg/m    General: Obese Caucasian male appearing in no acute distress. Head: Normocephalic, atraumatic, sclera non-icteric, no xanthomas, nares are without discharge.  Neck: No carotid bruits. JVD at 9cm.   Lungs: Respirations regular and unlabored, without wheezes or rales.  Heart: Irregularly irregular. No S3 or S4.  2/6 SEM at [place]. Crisp mechanical valve sounds noted. No rubs, or gallops appreciated. Abdomen: Soft, non-tender, non-distended with normoactive bowel sounds. No hepatomegaly. No rebound/guarding. No obvious abdominal masses. Msk:  Strength and tone appear normal for age. No joint deformities or effusions. Extremities: No clubbing or cyanosis. 1+ pitting edema in addition to chronic edematous changes. Distal pedal pulses are 1+ bilaterally. Neuro: Alert and oriented X 3. Moves all extremities spontaneously. No focal deficits noted. Psych:  Responds to questions appropriately with a flat affect. Skin: No rashes or lesions noted  Wt Readings from Last 3 Encounters:  07/14/16 239 lb 3.2 oz (108.5 kg)  06/25/16 226 lb (102.5 kg)  06/17/16 230 lb (104.3 kg)    Studies/Labs Reviewed:   EKG:  EKG is ordered today.  The ekg ordered today demonstrates atrial fibrillation with PVC's, HR 75, with LAD and TWI in Lead I (similar to previous tracings).   Recent Labs: 10/09/2015: Magnesium 1.8 02/05/2016: ALT 19 06/25/2016: B Natriuretic Peptide 175.3; BUN 17; Creatinine, Ser 0.94; Hemoglobin 8.6; Platelets 168; Potassium 4.2; Sodium 136   Lipid Panel    Component Value Date/Time   CHOL 118 08/18/2012 1137   TRIG 133.0 08/18/2012 1137  HDL 27.20 (L) 08/18/2012 1137   CHOLHDL 4 08/18/2012 1137   VLDL 26.6 08/18/2012 1137   LDLCALC 64 08/18/2012 1137    Additional studies/ records that were reviewed today include:  Echocardiogram: 12/2015 Study Conclusions  - Left ventricle: The cavity size was normal. There was severe   concentric hypertrophy. Systolic function was vigorous. The   estimated ejection fraction was in the range of 65% to 70%. Wall   motion was normal; there were no regional wall motion   abnormalities. The study is not technically sufficient to allow   evaluation  of LV diastolic function. - Aortic valve: A mechanical prosthesis was present and functioning   normally. Peak velocity (S): 345 cm/s. Mean gradient (S): 28 mm   Hg. (Consistent with moderate aortic stenosis gradient) - Aorta: Post Bentall with ST Jude Mechanical Valve in 1988. Aortic   arch diameter: 40 mm. - Aortic arch: The aortic arch was mildly dilated. - Mitral valve: A mechanical prosthesis was present and functioning   normally. - Left atrium: The atrium was severely dilated. - Right ventricle: The cavity size was mildly dilated. Wall   thickness was normal. - Right atrium: The atrium was moderately dilated. - Atrial septum: The septum bowed from left to right, consistent   with increased left atrial pressure. - Tricuspid valve: There was moderate regurgitation. - Pulmonary arteries: Systolic pressure was moderately to severely   increased. PA peak pressure: 67 mm Hg (S).  Impressions:  - There was no evidence of a vegetation.  Assessment:    1. Acute on chronic diastolic CHF (congestive heart failure) (HCC)   2. Lymphedema of both lower extremities   3. Ascending aortic dissection ([place])   4. 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   5. Severe mitral regurgitation   6. Coronary artery disease involving coronary bypass graft of native heart without angina pectoris   7. Essential hypertension   8. Depression, unspecified depression type      Plan:   In order of problems listed above:  1. Acute on Chronic Diastolic CHF - recently seen at [facility] on 11/9 for evaluation or progressive dyspnea. ProBNP was elevated to 1814. He was given one dose of IV Lasix and instructed to increase his PO Lasix from 20mg  daily to 20mg  BID for the next 4 days.   - notes worsening dyspnea with exertion along with worsening edema in addition of this chronic lymphedema. Denies any orthopnea or PND. Weight is up 9 lbs since last office visit. Lungs are  clear on examination today but JVD remains elevated and edema is acutely worsened from baseline.  - will increase PO Lasix dosing to 20mg  in AM and 40mg  in PM (patient unable to take 40mg  dosing in AM due to concerns of frequent urination while taking his wife and son to work and school). Recheck BNP and BMET today.   2. Lymphedema of lower extremities bilaterally - followed by Vascular Clinic at [facility]. Currently using compression machine for 6+ hours daily to control his worsening edema.   3. Ascending Aortic Dissection - s/p Bentall procedure in 1988. Stable by echo in 12/2015  4. Severe mitral regurgitation - s/p St Jude mechanical valve placed in 12/2014 - on Coumadin. Denies any evidence of active bleeding. Most recent INR therapeutic at 3.0 on 06/30/2016.  5. CAD - s/p SVG-RCA, known to be occluded by cath in 12/2014 - recent ER visit note mentioned chest pain but the patient  denies any recent episodes of chest discomfort. EKG today is without acute ischemic changes.   6. HTN - BP well-controlled at 130/77. - continue current medication regimen with adjustments as above.   7. Depression - He became tearful multiple times during today's visit in talking about his multiple chronic medical conditions. He denies any suicidal ideations but mentions life is "very difficult" on a daily basis.  - I advised him to follow-up with his PCP as he may benefit from further assessment and potential pharmacologic vs. alternative therapy.    Medication Adjustments/Labs and Tests Ordered: Current medicines are reviewed at length with the patient today.  Concerns regarding medicines are outlined above.  Medication changes, Labs and Tests ordered today are listed in the Patient Instructions below. Patient Instructions  Medication Instructions:  INCREASE Lasix (Furosemide) Take 1 tablet (20mg ) in the morning and 2 tablets (40mg ) in the evening   Labwork: Your physician recommends that you return for  lab work in: TODAY-BMET, BNP  Testing/Procedures: None   Follow-Up: Your physician recommends that you schedule a follow-up appointment in: [place].  Any Other Special Instructions Will Be Listed Below (If Applicable).  If you need a refill on your cardiac medications before your next appointment, please call your pharmacy.   Arna Medici, PA  07/14/2016 6:09 PM    [facility] [place], [place], [place]  [address] Phone: [phone]; Fax: [phone]  [address] [place], [place] [address] Phone: [phone]

## 2016-07-15 LAB — BRAIN NATRIURETIC PEPTIDE: Brain Natriuretic Peptide: 168.9 pg/mL — ABNORMAL HIGH (ref <100)

## 2016-07-16 NOTE — Addendum Note (Signed)
Addended by: Leland Johns A on: 07/16/2016 05:12 PM   Modules accepted: Orders

## 2016-07-17 ENCOUNTER — Encounter: Payer: Self-pay | Admitting: Pain Medicine

## 2016-07-19 ENCOUNTER — Other Ambulatory Visit: Payer: Self-pay | Admitting: Pain Medicine

## 2016-07-19 DIAGNOSIS — M7918 Myalgia, other site: Secondary | ICD-10-CM

## 2016-07-21 ENCOUNTER — Ambulatory Visit (INDEPENDENT_AMBULATORY_CARE_PROVIDER_SITE_OTHER): Payer: Medicare HMO | Admitting: *Deleted

## 2016-07-21 DIAGNOSIS — I482 Chronic atrial fibrillation, unspecified: Secondary | ICD-10-CM

## 2016-07-21 DIAGNOSIS — Z952 Presence of prosthetic heart valve: Secondary | ICD-10-CM | POA: Diagnosis not present

## 2016-07-21 DIAGNOSIS — I481 Persistent atrial fibrillation: Secondary | ICD-10-CM

## 2016-07-21 DIAGNOSIS — Z5181 Encounter for therapeutic drug level monitoring: Secondary | ICD-10-CM | POA: Diagnosis not present

## 2016-07-21 DIAGNOSIS — I4819 Other persistent atrial fibrillation: Secondary | ICD-10-CM

## 2016-07-21 LAB — POCT INR: INR: 3.2

## 2016-07-28 ENCOUNTER — Ambulatory Visit: Payer: Medicare HMO | Attending: Pain Medicine | Admitting: Pain Medicine

## 2016-07-28 ENCOUNTER — Encounter: Payer: Self-pay | Admitting: Pain Medicine

## 2016-07-28 VITALS — BP 140/82 | HR 76 | Temp 97.5°F | Resp 16 | Ht 61.0 in | Wt 226.0 lb

## 2016-07-28 DIAGNOSIS — K219 Gastro-esophageal reflux disease without esophagitis: Secondary | ICD-10-CM | POA: Diagnosis not present

## 2016-07-28 DIAGNOSIS — G510 Bell's palsy: Secondary | ICD-10-CM | POA: Diagnosis not present

## 2016-07-28 DIAGNOSIS — E119 Type 2 diabetes mellitus without complications: Secondary | ICD-10-CM | POA: Diagnosis not present

## 2016-07-28 DIAGNOSIS — Z7901 Long term (current) use of anticoagulants: Secondary | ICD-10-CM | POA: Insufficient documentation

## 2016-07-28 DIAGNOSIS — I2510 Atherosclerotic heart disease of native coronary artery without angina pectoris: Secondary | ICD-10-CM | POA: Insufficient documentation

## 2016-07-28 DIAGNOSIS — Z79891 Long term (current) use of opiate analgesic: Secondary | ICD-10-CM | POA: Diagnosis not present

## 2016-07-28 DIAGNOSIS — Z86010 Personal history of colonic polyps: Secondary | ICD-10-CM | POA: Insufficient documentation

## 2016-07-28 DIAGNOSIS — I219 Acute myocardial infarction, unspecified: Secondary | ICD-10-CM | POA: Diagnosis not present

## 2016-07-28 DIAGNOSIS — J80 Acute respiratory distress syndrome: Secondary | ICD-10-CM | POA: Diagnosis not present

## 2016-07-28 DIAGNOSIS — I5032 Chronic diastolic (congestive) heart failure: Secondary | ICD-10-CM | POA: Insufficient documentation

## 2016-07-28 DIAGNOSIS — M109 Gout, unspecified: Secondary | ICD-10-CM | POA: Insufficient documentation

## 2016-07-28 DIAGNOSIS — G4733 Obstructive sleep apnea (adult) (pediatric): Secondary | ICD-10-CM | POA: Diagnosis not present

## 2016-07-28 DIAGNOSIS — K449 Diaphragmatic hernia without obstruction or gangrene: Secondary | ICD-10-CM | POA: Insufficient documentation

## 2016-07-28 DIAGNOSIS — Z833 Family history of diabetes mellitus: Secondary | ICD-10-CM | POA: Insufficient documentation

## 2016-07-28 DIAGNOSIS — N529 Male erectile dysfunction, unspecified: Secondary | ICD-10-CM | POA: Insufficient documentation

## 2016-07-28 DIAGNOSIS — M48061 Spinal stenosis, lumbar region without neurogenic claudication: Secondary | ICD-10-CM

## 2016-07-28 DIAGNOSIS — G9340 Encephalopathy, unspecified: Secondary | ICD-10-CM | POA: Diagnosis not present

## 2016-07-28 DIAGNOSIS — I252 Old myocardial infarction: Secondary | ICD-10-CM | POA: Diagnosis not present

## 2016-07-28 DIAGNOSIS — F419 Anxiety disorder, unspecified: Secondary | ICD-10-CM | POA: Insufficient documentation

## 2016-07-28 DIAGNOSIS — G894 Chronic pain syndrome: Secondary | ICD-10-CM | POA: Diagnosis not present

## 2016-07-28 DIAGNOSIS — M48062 Spinal stenosis, lumbar region with neurogenic claudication: Secondary | ICD-10-CM | POA: Insufficient documentation

## 2016-07-28 DIAGNOSIS — Z8249 Family history of ischemic heart disease and other diseases of the circulatory system: Secondary | ICD-10-CM | POA: Insufficient documentation

## 2016-07-28 DIAGNOSIS — F1190 Opioid use, unspecified, uncomplicated: Secondary | ICD-10-CM | POA: Diagnosis not present

## 2016-07-28 DIAGNOSIS — M791 Myalgia: Secondary | ICD-10-CM | POA: Insufficient documentation

## 2016-07-28 DIAGNOSIS — Z8673 Personal history of transient ischemic attack (TIA), and cerebral infarction without residual deficits: Secondary | ICD-10-CM | POA: Insufficient documentation

## 2016-07-28 DIAGNOSIS — I110 Hypertensive heart disease with heart failure: Secondary | ICD-10-CM | POA: Insufficient documentation

## 2016-07-28 DIAGNOSIS — I482 Chronic atrial fibrillation: Secondary | ICD-10-CM | POA: Insufficient documentation

## 2016-07-28 DIAGNOSIS — I872 Venous insufficiency (chronic) (peripheral): Secondary | ICD-10-CM | POA: Diagnosis not present

## 2016-07-28 DIAGNOSIS — E785 Hyperlipidemia, unspecified: Secondary | ICD-10-CM | POA: Insufficient documentation

## 2016-07-28 DIAGNOSIS — Z951 Presence of aortocoronary bypass graft: Secondary | ICD-10-CM | POA: Insufficient documentation

## 2016-07-28 DIAGNOSIS — Z881 Allergy status to other antibiotic agents status: Secondary | ICD-10-CM | POA: Insufficient documentation

## 2016-07-28 DIAGNOSIS — Z952 Presence of prosthetic heart valve: Secondary | ICD-10-CM | POA: Insufficient documentation

## 2016-07-28 DIAGNOSIS — Z809 Family history of malignant neoplasm, unspecified: Secondary | ICD-10-CM | POA: Insufficient documentation

## 2016-07-28 DIAGNOSIS — M7918 Myalgia, other site: Secondary | ICD-10-CM

## 2016-07-28 DIAGNOSIS — Z6841 Body Mass Index (BMI) 40.0 and over, adult: Secondary | ICD-10-CM | POA: Insufficient documentation

## 2016-07-28 DIAGNOSIS — Z823 Family history of stroke: Secondary | ICD-10-CM | POA: Insufficient documentation

## 2016-07-28 DIAGNOSIS — D649 Anemia, unspecified: Secondary | ICD-10-CM | POA: Insufficient documentation

## 2016-07-28 DIAGNOSIS — Z9109 Other allergy status, other than to drugs and biological substances: Secondary | ICD-10-CM | POA: Insufficient documentation

## 2016-07-28 DIAGNOSIS — Z800 Family history of malignant neoplasm of digestive organs: Secondary | ICD-10-CM | POA: Insufficient documentation

## 2016-07-28 DIAGNOSIS — Z880 Allergy status to penicillin: Secondary | ICD-10-CM | POA: Insufficient documentation

## 2016-07-28 DIAGNOSIS — F17200 Nicotine dependence, unspecified, uncomplicated: Secondary | ICD-10-CM | POA: Insufficient documentation

## 2016-07-28 DIAGNOSIS — Z9049 Acquired absence of other specified parts of digestive tract: Secondary | ICD-10-CM | POA: Insufficient documentation

## 2016-07-28 MED ORDER — OXYCODONE HCL 5 MG PO TABS: 5.0000 mg | ORAL_TABLET | Freq: Three times a day (TID) | ORAL | 0 refills | Status: DC | PRN

## 2016-07-28 MED ORDER — METHOCARBAMOL 500 MG PO TABS: 500.0000 mg | ORAL_TABLET | Freq: Three times a day (TID) | ORAL | 2 refills | Status: DC

## 2016-07-28 NOTE — Progress Notes (Signed)
Patient's Name: Lance Cook  MRN: [id number]  Referring Provider: [name], PA-C  DOB: 1961-02-27  PCP: [name], PA-C  DOS: 07/28/2016  Note by: Kathlen Brunswick. Dossie Arbour, MD  Service setting: Ambulatory outpatient  Specialty: Interventional Pain Management  Location: [facility] (AMB) Pain Management Facility    Patient type: Established   Primary Reason(s) for Visit: Encounter for prescription drug management (Level of risk: moderate) CC: Back Pain (L4 and L5)  HPI  Mr. Elamin is a 55 y.o. year old, male patient, who comes today for a medication management evaluation. 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; Ascending aortic dissection ([place]); Chronic diastolic congestive heart failure (HCC); 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; Enteritis due to Clostridium difficile; Hypomagnesemia; Physical deconditioning; Long term current use of opiate analgesic; Long term prescription opiate use; Opiate use (22.5 MME/day); Opiate dependence ([place]); Encounter for therapeutic drug level monitoring; Chronic low back pain (midline); Lumbar facet syndrome; Personal history of transient ischemic attack (TIA), and cerebral infarction without residual deficits; ED (erectile dysfunction) of organic origin; Eunuchoidism; Adult BMI 30+; Obstructive apnea; Testicular hypofunction; Lumbar canal stenosis; Malignant neoplasm of testis ([place]); Type 2 diabetes  mellitus ([place]); Chronic lower extremity pain (Left); Personal history of malignant neoplasm of testis; Musculoskeletal pain; History of colonic polyps; Chronic diarrhea; Benign neoplasm of transverse colon; Scrotal swelling; and Chronic pain syndrome on his problem list. His primarily concern today is the Back Pain (L4 and L5)  Pain Assessment: Self-Reported Pain Score: 9 /10 Clinically the patient looks like a 2/10 Reported level is inconsistent with clinical observations. Information on the proper use of the pain score provided to the patient today. Pain Type: Chronic pain Pain Location: Back Pain Orientation: Lower (L4 and L5) Pain Descriptors / Indicators: Aching, Constant, Stabbing, Sharp Pain Frequency: Constant  Mr. Lucci was last seen on 07/19/2016 for medication management. During today's appointment we reviewed Mr. Yaworski's chronic pain status, as well as his outpatient medication regimen.  The patient  reports that he does not use drugs. His body mass index is 42.7 kg/m.  Further details on both, my assessment(s), as well as the proposed treatment plan, please see below.  Controlled Substance Pharmacotherapy Assessment REMS (Risk Evaluation and Mitigation Strategy)  Analgesic:Oxycodone IR '5mg'$  q8hrs (15 mg/day of oxycodone) MME/day:22.5 mg/day Evon Slack, RN  07/28/2016 11:15 AM  Sign at close encounter Nursing Pain Medication Assessment:  Safety precautions to be maintained throughout the outpatient stay will include: orient to surroundings, keep bed in low position, maintain call bell within reach at all times, provide assistance with transfer out of bed and ambulation.  Medication Inspection Compliance: Pill count conducted under aseptic conditions, in front of the patient. Neither the pills nor the bottle was removed from the patient's sight at any time. Once count was completed pills were immediately returned to the patient in their original bottle.  Medication: Oxycodone  IR Pill Count: 0 of 90 pills remain Bottle Appearance: Standard pharmacy container. Clearly labeled. Filled Date: 10 / 22 / 2017 Medication last intake: 07/27/2016   Pharmacokinetics: Liberation and absorption (  onset of action): WNL Distribution (time to peak effect): WNL Metabolism and excretion (duration of action): WNL         Pharmacodynamics: Desired effects: Analgesia: Mr. Germond reports >50% benefit. Functional ability: Patient reports that medication allows him to accomplish basic ADLs Clinically meaningful improvement in function (CMIF): Sustained CMIF goals met Perceived effectiveness: Described as relatively effective, allowing for increase in activities of daily living (ADL) Undesirable effects: Side-effects or Adverse reactions: None reported Monitoring: [place] PMP: Online review of the past 91-month period conducted. Compliant with practice rules and regulations List of all UDS test(s) done:  Lab Results  Component Value Date   TOXASSSELUR FINAL 01/07/2016   TOXASSSELUR FINAL 10/09/2015   TOXASSSELUR FINAL 07/11/2015   Last UDS on record: ToxAssure Select 13  Date Value Ref Range Status  01/07/2016 FINAL  Final    Comment:    ==================================================================== TOXASSURE SELECT 13 (MW) ==================================================================== Test                             Result       Flag       Units Drug Present and Declared for Prescription Verification   Oxazepam                       782          EXPECTED   ng/mg creat   Temazepam                      10809        EXPECTED   ng/mg creat    Oxazepam and temazepam are expected metabolites of diazepam.    Oxazepam is also an expected metabolite of other benzodiazepine    drugs, including chlordiazepoxide, prazepam, clorazepate,    halazepam, and temazepam.  Oxazepam and temazepam are available    as scheduled prescription medications.   Oxycodone                       1568         EXPECTED   ng/mg creat   Oxymorphone                    305          EXPECTED   ng/mg creat   Noroxycodone                   5650         EXPECTED   ng/mg creat    Sources of oxycodone include scheduled prescription medications.    Oxymorphone and noroxycodone are expected metabolites of    oxycodone. Oxymorphone is also available as a scheduled    prescription medication. ==================================================================== Test                      Result    Flag   Units      Ref Range   Creatinine              22               mg/dL      >=47 ==================================================================== Declared Medications:  The flagging and interpretation on this report are based on the  following declared medications.  Unexpected results may arise from  inaccuracies in the declared medications.  **Note: The testing scope of this panel includes these medications:  Oxycodone (Oxy-IR)  Temazepam (Restoril)  **Note: The testing scope of this panel does not include following  reported medications:  Allopurinol (Zyloprim)  Atenolol (Tenormin)  Calcium (Calcium/Magnesium/Zinc)  Clindamycin (Cleocin)  Furosemide (Lasix)  Magnesium (Calcium/Magnesium/Zinc)  Magnesium (Mag-Ox)  Methocarbamol (Robaxin)  Potassium (K-Dur)  Topical  Topical (Silvadene)  Vitamin B  Warfarin (Coumadin)  Zinc (Calcium/Magnesium/Zinc) ==================================================================== For clinical consultation, please call [phone]. ====================================================================    UDS interpretation: Compliant Patient informed of the CDC guidelines and recommendations to stay away from the concomitant use of benzodiazepines and opioids due to the increased risk of respiratory depression and death. Medication Assessment Form: Reviewed. Patient indicates being compliant with therapy Treatment compliance: Compliant Risk  Assessment Profile: Aberrant behavior: See prior evaluations. None observed or detected today Comorbid factors increasing risk of overdose: See prior notes. No additional risks detected today Risk of substance use disorder (SUD): Low Opioid Risk Tool (ORT) Total Score: 1  Interpretation Table:  Score <3 = Low Risk for SUD  Score between 4-7 = Moderate Risk for SUD  Score >8 = High Risk for Opioid Abuse   Risk Mitigation Strategies:  Patient Counseling: Covered Patient-Prescriber Agreement (PPA): Present and active  Notification to other healthcare providers: Done  Pharmacologic Plan: No change in therapy, at this time  Laboratory Chemistry  Inflammation Markers Lab Results  Component Value Date   ESRSEDRATE 9 01/14/2016   CRP 0.9 10/09/2015   Renal Function Lab Results  Component Value Date   BUN 20 07/14/2016   CREATININE 0.85 07/14/2016   GFRAA >60 06/25/2016   GFRNONAA >60 06/25/2016   Hepatic Function Lab Results  Component Value Date   AST 21 02/05/2016   ALT 19 02/05/2016   ALBUMIN 2.6 (L) 02/05/2016   Electrolytes Lab Results  Component Value Date   NA 141 07/14/2016   K 4.5 07/14/2016   CL 107 07/14/2016   CALCIUM 8.1 (L) 07/14/2016   MG 1.8 10/09/2015   Pain Modulating Vitamins Lab Results  Component Value Date   VITAMINB12 371 01/07/2012   Coagulation Parameters Lab Results  Component Value Date   INR 3.2 07/21/2016   LABPROT 39.5 (H) 02/05/2016   APTT 41 (H) 01/24/2015   PLT 168 06/25/2016   Cardiovascular Lab Results  Component Value Date   BNP 168.9 (H) 07/14/2016   HGB 8.6 (L) 06/25/2016   HCT 31.5 (L) 06/25/2016   Note: Lab results reviewed.  Recent Diagnostic Imaging Review  US Scrotum  Result Date: 06/25/2016 CLINICAL DATA:  Scrotal swelling. History of left orchiectomy age 62 for cancer. EXAM: SCROTAL ULTRASOUND DOPPLER ULTRASOUND OF THE TESTICLES TECHNIQUE: Complete ultrasound examination of the testicles, epididymis, and  other scrotal structures was performed. Color and spectral Doppler ultrasound were also utilized to evaluate blood flow to the testicles. COMPARISON:  None. FINDINGS: Right testicle Measurements: 3.4 x 1.9 x 2.8 cm. No mass or microlithiasis visualized. Left testicle Measurements: . Surgically absent left testicle. No mass in the left scrotum. Right epididymis:  Normal in size and appearance. Left epididymis:  Surgically absent Hydrocele:  Mild right hydrocele Varicocele:  Right-sided varicocele. Diffuse thickening of the scrotum compatible with edema. Pulsed Doppler interrogation of both testes demonstrates normal low resistance arterial and venous waveforms bilaterally. IMPRESSION: Diffuse scrotal skin thickening compatible with edema. Right testicle normal.  Right hydrocele and right varicocele Left scrotum surgically absent. Electronically Signed   By: Franchot Gallo M.D.   On: 06/25/2016 07:48   [place] Art/ven Flow Abd Pelv Doppler  Result  Date: 06/25/2016 CLINICAL DATA:  Scrotal swelling. History of left orchiectomy age 77 for cancer. EXAM: SCROTAL ULTRASOUND DOPPLER ULTRASOUND OF THE TESTICLES TECHNIQUE: Complete ultrasound examination of the testicles, epididymis, and other scrotal structures was performed. Color and spectral Doppler ultrasound were also utilized to evaluate blood flow to the testicles. COMPARISON:  None. FINDINGS: Right testicle Measurements: 3.4 x 1.9 x 2.8 cm. No mass or microlithiasis visualized. Left testicle Measurements: . Surgically absent left testicle. No mass in the left scrotum. Right epididymis:  Normal in size and appearance. Left epididymis:  Surgically absent Hydrocele:  Mild right hydrocele Varicocele:  Right-sided varicocele. Diffuse thickening of the scrotum compatible with edema. Pulsed Doppler interrogation of both testes demonstrates normal low resistance arterial and venous waveforms bilaterally. IMPRESSION: Diffuse scrotal skin thickening compatible with edema. Right  testicle normal.  Right hydrocele and right varicocele Left scrotum surgically absent. Electronically Signed   By: Franchot Gallo M.D.   On: 06/25/2016 07:48   Note: Imaging results reviewed.  Meds  The patient has a current medication list which includes the following prescription(s): acetaminophen, allopurinol, atenolol, b complex vitamins, bacitracin, calcium-magnesium-zinc, clindamycin, digoxin, enoxaparin, furosemide, blood glucose test strips, magnesium oxide, methocarbamol, oxycodone, oxycodone, oxycodone, potassium, potassium chloride, temazepam, warfarin, and warfarin.  Current Outpatient Prescriptions on File Prior to Visit  Medication Sig  . acetaminophen (TYLENOL) 500 MG tablet Take 500 mg by mouth 2 (two) times daily.  Marland Kitchen allopurinol (ZYLOPRIM) 300 MG tablet Take 300 mg by mouth daily.  Marland Kitchen atenolol (TENORMIN) 100 MG tablet Take 0.5 tablets (50 mg total) by mouth 2 (two) times daily.  Marland Kitchen b complex vitamins tablet Take 1 tablet by mouth daily.    . bacitracin 500 UNIT/GM ointment Apply 1 application topically as needed for wound care.   . Calcium-Magnesium-Zinc (CAL-MAG-ZINC PO) Take 1 tablet by mouth daily.   . clindamycin (CLEOCIN) 300 MG capsule Take 300 mg by mouth as needed (for dental appointments).   . digoxin (LANOXIN) 0.25 MG tablet TAKE 1 TABLET EVERY DAY  . enoxaparin (LOVENOX) 100 MG/ML injection Inject 1 mL (100 mg total) into the skin every 12 (twelve) hours. (Patient taking differently: Inject 100 mg into the skin every 12 (twelve) hours. 06-11-16 Pt has instructions to stop Coumadin and bridge with Lovenox with last dose to be AM day before procedure.)  . furosemide (LASIX) 20 MG tablet TAKE 1 TABLET IN THE MORNING AND 2 TABLETS ('40MG'$ ) IN THE EVENING  . Glucose Blood (BLOOD GLUCOSE TEST STRIPS) STRP Use as instructed  . magnesium oxide (MAG-OX) 400 (241.3 MG) MG tablet Take 1 tablet (400 mg total) by mouth 2 (two) times daily.  . Potassium 99 MG TABS Take 99 mg by mouth  daily.  . potassium chloride (K-DUR) 10 MEQ tablet Take 2 tablets (20 mEq total) by mouth 2 (two) times daily.  . temazepam (RESTORIL) 15 MG capsule Take 15 mg by mouth at bedtime as needed for sleep.  Marland Kitchen warfarin (COUMADIN) 5 MG tablet TAKE 1 TO 1 AND 1/2 TABLETS DAILY AS DIRECTED BY COUMADIN CLINIC  . warfarin (COUMADIN) 5 MG tablet Take 5-10 mg by mouth See admin instructions. Alternate taking 5 mg one day the 7.5 mg the other day Take 10 mg on 06/24/16   No current facility-administered medications on file prior to visit.    ROS  Constitutional: Denies any fever or chills Gastrointestinal: No reported hemesis, hematochezia, vomiting, or acute GI distress Musculoskeletal: Denies any acute onset joint swelling, redness, loss of ROM,  or weakness Neurological: No reported episodes of acute onset apraxia, aphasia, dysarthria, agnosia, amnesia, paralysis, loss of coordination, or loss of consciousness  Allergies  Mr. Attridge is allergic to penicillins; adhesive [tape]; and bactrim [sulfamethoxazole-trimethoprim].  [place]  Drug: Mr. Hardman  reports that he does not use drugs. Alcohol:  reports that he drinks alcohol. Tobacco:  reports that he has been smoking Cigars.  He has never used smokeless tobacco. Medical:  has a past medical history of Acute respiratory failure with hypoxemia ([place]); Anemia; Anxiety; Aortic aneurysm ([place]); Aortic aneurysm and dissection (HCC); ARDS (adult respiratory distress syndrome) ([place]) (01/27/2015); Arthritis; Ascending aortic dissection ([place]) (07/14/2008); Asymptomatic chronic venous hypertension (01/15/2010); Atrial fibrillation ([place]); Bell's palsy; CAD (coronary artery disease); Cellulitis (04/02/2014); Cerebral artery occlusion with cerebral infarction [facility]) (10/08/2011); CHF (congestive heart failure) ([place]); Chronic diastolic congestive heart failure ([place]); Chronic diastolic heart failure ([place]) (03/01/2015); Chronic LBP (10/08/2011); CVA (cerebral vascular accident) ([place])  (10/08/2011); Diabetes mellitus; DIABETES MELLITUS, TYPE II (11/01/2009); Diverticulosis; Dysrhythmia; 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); Encephalopathy acute (02/05/2015); Erectile dysfunction (10/08/2011); GERD (gastroesophageal reflux disease); Gout; H/O mechanical aortic valve replacement (11/01/2009); Hiatal hernia; History of adenomatous polyp of colon (12/23/2011); History of colon polyps (12/23/2011); Hyperlipidemia; Hypertension; Hypogonadism male (04/08/2012); Impaired glucose tolerance (10/08/2011); Leg pain (06/28/2010); Morbid obesity ([place]) (04/02/2014); Myocardial infarction (age 15); Obesity; OSA (obstructive sleep apnea); Peripheral vascular disease ([place]); Reflux (11/06/09); 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 (2/42/3536); 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 Family: family history includes Cancer in his maternal aunt and maternal uncle; Diabetes in his father; Heart disease in his mother; Hypertension in his other; Stroke in his other; Throat cancer in his mother.  Past Surgical History:  Procedure Laterality Date  . aortic truck    . BENTALL PROCEDURE  1988   25 mm St Jude mechanical valve conduit - Dr Blase Mess at [facility] in [place], [place]  . CHOLECYSTECTOMY  2011  . COLONOSCOPY WITH PROPOFOL N/A 06/17/2016   Procedure: COLONOSCOPY WITH PROPOFOL;  Surgeon: Jerene Bears, MD;  Location: [facility] ENDOSCOPY;  Service: Gastroenterology;  Laterality: N/A;  . CORONARY ARTERY BYPASS GRAFT    . LASER ABLATION  03/06/2010   left leg  . MITRAL VALVE REPLACEMENT Right 01/24/2015   Procedure: Re-Operation, MINIMALLY INVASIVE MITRAL VALVE (MV) REPLACEMENT.;  Surgeon: Rexene Alberts, MD;  Location: [place];  Service:  Open Heart Surgery;  Laterality: Right;  . ORCHIECTOMY  age 74   testicular cancer  . OTOPLASTY     bilateral, age 86  . TEE WITHOUT CARDIOVERSION N/A 10/11/2014   Procedure: TRANSESOPHAGEAL ECHOCARDIOGRAM (TEE);  Surgeon: Pixie Casino, MD;  Location: [facility]COPY;  Service: Cardiovascular;  Laterality: N/A;  . TEE WITHOUT CARDIOVERSION N/A 01/24/2015   Procedure: TRANSESOPHAGEAL ECHOCARDIOGRAM (TEE);  Surgeon: Rexene Alberts, MD;  Location: [place];  Service: Open Heart Surgery;  Laterality: N/A;  . TONSILLECTOMY  1967  . TOOTH EXTRACTION  2016   Constitutional Exam  General appearance: Well nourished, well developed, and well hydrated. In no apparent acute distress Vitals:   07/28/16 1059  BP: 140/82  Pulse: 76  Resp: 16  Temp: 97.5 F (36.4 C)  TempSrc: Oral  SpO2: 99%  Weight: 226 lb (102.5 kg)  Height: '5\' 1"'$  (1.549 m)  BMI Assessment: Estimated body mass index is 42.7 kg/m as calculated from the following:   Height as of this encounter: '5\' 1"'$  (1.549 m).   Weight as of this encounter: 226 lb (102.5 kg).  BMI interpretation table: BMI level Category Range association with higher incidence of chronic pain  <18 kg/m2 Underweight   18.5-24.9 kg/m2 Ideal body weight   25-29.9 kg/m2 Overweight Increased incidence by 20%  30-34.9 kg/m2 Obese (Class I) Increased incidence by 68%  35-39.9 kg/m2 Severe obesity (Class II) Increased incidence by 136%  >40 kg/m2 Extreme obesity (Class III) Increased incidence by 254%   BMI Readings from Last 4 Encounters:  07/28/16 42.70 kg/m  07/14/16 45.20 kg/m  06/25/16 42.70 kg/m  06/17/16 43.46 kg/m   Wt Readings from Last 4 Encounters:  07/28/16 226 lb (102.5 kg)  07/14/16 239 lb 3.2 oz (108.5 kg)  06/25/16 226 lb (102.5 kg)  06/17/16 230 lb (104.3 kg)  Psych/Mental status: Alert, oriented x 3 (person, place, & time) Eyes: PERLA Respiratory: No evidence of acute respiratory distress  Cervical Spine Exam  Inspection: No  masses, redness, or swelling Alignment: Symmetrical Functional ROM: Unrestricted ROM Stability: No instability detected Muscle strength & Tone: Functionally intact Sensory: Unimpaired Palpation: Non-contributory  Upper Extremity (UE) Exam    Side: Right upper extremity  Side: Left upper extremity  Inspection: No masses, redness, swelling, or asymmetry  Inspection: No masses, redness, swelling, or asymmetry  Functional ROM: Unrestricted ROM          Functional ROM: Unrestricted ROM          Muscle strength & Tone: Functionally intact  Muscle strength & Tone: Functionally intact  Sensory: Unimpaired  Sensory: Unimpaired  Palpation: Non-contributory  Palpation: Non-contributory   Thoracic Spine Exam  Inspection: No masses, redness, or swelling Alignment: Symmetrical Functional ROM: Unrestricted ROM Stability: No instability detected Sensory: Unimpaired Muscle strength & Tone: Functionally intact Palpation: Non-contributory  Lumbar Spine Exam  Inspection: No masses, redness, or swelling Alignment: Symmetrical Functional ROM: Unrestricted ROM Stability: No instability detected Muscle strength & Tone: Functionally intact Sensory: Unimpaired Palpation: Non-contributory Provocative Tests: Lumbar Hyperextension and rotation test: evaluation deferred today       Patrick's Maneuver: evaluation deferred today              Gait & Posture Assessment  Ambulation: Unassisted Gait: Antalgic Posture: Antalgic   Lower Extremity Exam    Side: Right lower extremity  Side: Left lower extremity  Inspection: No masses, redness, swelling, or asymmetry  Inspection: No masses, redness, swelling, or asymmetry  Functional ROM: Unrestricted ROM          Functional ROM: Unrestricted ROM          Muscle strength & Tone: Functionally intact  Muscle strength & Tone: Functionally intact  Sensory: Unimpaired  Sensory: Unimpaired  Palpation: Non-contributory  Palpation: Non-contributory   Assessment   Primary Diagnosis & Pertinent Problem List: The primary encounter diagnosis was Chronic pain syndrome. Diagnoses of Spinal stenosis of lumbar region, unspecified whether neurogenic claudication present, Long term prescription opiate use, Opiate use (22.5 MME/day), and Musculoskeletal pain were also pertinent to this visit.  Visit Diagnosis: 1. Chronic pain syndrome   2. Spinal stenosis of lumbar region, unspecified whether neurogenic claudication present   3. Long term prescription opiate use   4. Opiate use (22.5 MME/day)   5. Musculoskeletal pain    Plan of Care  Pharmacotherapy (Medications Ordered): Meds ordered this encounter  Medications  . oxyCODONE (OXY  IR/ROXICODONE) 5 MG immediate release tablet    Sig: Take 1 tablet (5 mg total) by mouth every 8 (eight) hours as needed for severe pain.    Dispense:  90 tablet    Refill:  0    Do not place this medication, or any other prescription from our practice, on "Automatic Refill". Patient may have prescription filled one day early if pharmacy is closed on scheduled refill date. Do not fill until: 07/28/16 To last until: 08/27/16  . oxyCODONE (OXY IR/ROXICODONE) 5 MG immediate release tablet    Sig: Take 1 tablet (5 mg total) by mouth every 8 (eight) hours as needed for severe pain.    Dispense:  90 tablet    Refill:  0    Do not place this medication, or any other prescription from our practice, on "Automatic Refill". Patient may have prescription filled one day early if pharmacy is closed on scheduled refill date. Do not fill until: 08/27/16 To last until: 09/26/16  . oxyCODONE (OXY IR/ROXICODONE) 5 MG immediate release tablet    Sig: Take 1 tablet (5 mg total) by mouth every 8 (eight) hours as needed for severe pain.    Dispense:  90 tablet    Refill:  0    Do not place this medication, or any other prescription from our practice, on "Automatic Refill". Patient may have prescription filled one day early if pharmacy is closed on  scheduled refill date. Do not fill until: 09/26/16 To last until: 10/26/16  . DISCONTD: methocarbamol (ROBAXIN) 500 MG tablet    Sig: Take 1 tablet (500 mg total) by mouth 3 (three) times daily.    Dispense:  90 tablet    Refill:  2    Do not place this medication, or any other prescription from our practice, on "Automatic Refill". Patient may have prescription filled one day early if pharmacy is closed on scheduled refill date.  . methocarbamol (ROBAXIN) 500 MG tablet    Sig: Take 1 tablet (500 mg total) by mouth 3 (three) times daily.    Dispense:  90 tablet    Refill:  2    Do not place this medication, or any other prescription from our practice, on "Automatic Refill". Patient may have prescription filled one day early if pharmacy is closed on scheduled refill date.   New Prescriptions   No medications on file   Medications administered today: Mr. Aleman had no medications administered during this visit. Lab-work, procedure(s), and/or referral(s): No orders of the defined types were placed in this encounter.  Imaging and/or referral(s): None  Interventional therapies: Planned, scheduled, and/or pending:   None at this time    Considering:   Bilateral lumbar facet RFA.    Palliative PRN treatment(s):   Not at this time.   Provider-requested follow-up: Return in 2 months (on 10/07/2016) for Med-Mgmt.  Future Appointments Date Time Provider Department Center  08/15/2016 1:30 PM [facility] PREVISIT RM [facility]  08/18/2016 9:00 AM [facility]  10/07/2016 11:45 AM Delano Metz, MD [facility] None  10/17/2016 11:30 AM Rollene Rotunda, MD [facility]   Primary Care Physician: Teena Irani, PA-C Location: [facility] Out[facility] Pain Management Facility Note by: Sydnee Levans. Laban Emperor, M.D, DABA, DABAPM, DABPM, DABIPP, FIPP  Pain Score Disclaimer: We use the NRS-11 scale. This is a self-reported, subjective measurement of  pain severity with only modest accuracy. It is used primarily to identify changes within a particular patient. It must be understood that  outpatient pain scales are significantly less accurate that those used for research, where they can be applied under ideal controlled circumstances with minimal exposure to variables. In reality, the score is likely to be a combination of pain intensity and pain affect, where pain affect describes the degree of emotional arousal or changes in action readiness caused by the sensory experience of pain. Factors such as social and work situation, setting, emotional state, anxiety levels, expectation, and prior pain experience may influence pain perception and show large inter-individual differences that may also be affected by time variables.  Patient instructions provided during this appointment: There are no Patient Instructions on file for this visit.

## 2016-07-28 NOTE — Progress Notes (Signed)
Nursing Pain Medication Assessment:  Safety precautions to be maintained throughout the outpatient stay will include: orient to surroundings, keep bed in low position, maintain call bell within reach at all times, provide assistance with transfer out of bed and ambulation.  Medication Inspection Compliance: Pill count conducted under aseptic conditions, in front of the patient. Neither the pills nor the bottle was removed from the patient's sight at any time. Once count was completed pills were immediately returned to the patient in their original bottle.  Medication: Oxycodone IR Pill Count: 0 of 90 pills remain Bottle Appearance: Standard pharmacy container. Clearly labeled. Filled Date: 10 / 22 / 2017 Medication last intake: 07/27/2016

## 2016-08-08 ENCOUNTER — Telehealth: Payer: Self-pay | Admitting: Internal Medicine

## 2016-08-08 NOTE — Telephone Encounter (Signed)
Questions answered.

## 2016-08-13 ENCOUNTER — Encounter: Payer: Self-pay | Admitting: *Deleted

## 2016-08-13 NOTE — Telephone Encounter (Signed)
Sorry, clearance should have been sent a month ago when you originally requested it. Pt will need to be bridge with Lovenox if he holds Coumadin for 5 days. We will coordinate this in Coumadin clinic prior to procedure. He is bridged with Lovenox frequently and is familiar with the protocol.

## 2016-08-13 NOTE — Telephone Encounter (Signed)
Please see original note.... We need clearance for colonoscopy procedure and/or lovenox bridging for patient. He has not received any updated instruction. Procedure is for 09/02/16. Thank you for your help!

## 2016-08-15 ENCOUNTER — Other Ambulatory Visit: Payer: Self-pay | Admitting: Cardiology

## 2016-08-15 ENCOUNTER — Ambulatory Visit (AMBULATORY_SURGERY_CENTER): Payer: Self-pay

## 2016-08-15 VITALS — Ht 61.5 in | Wt 244.2 lb

## 2016-08-15 DIAGNOSIS — Z860100 Personal history of colon polyps, unspecified: Secondary | ICD-10-CM

## 2016-08-15 MED ORDER — SUPREP BOWEL PREP KIT 17.5-3.13-1.6 GM/177ML PO SOLN: 1.0000 | Freq: Once | ORAL | 0 refills | Status: AC

## 2016-08-15 NOTE — Progress Notes (Signed)
No allergies to eggs or soy No past problems with anesthesia No home oxygen No diet meds  Registered for emmi  

## 2016-08-15 NOTE — Telephone Encounter (Signed)
Patient called - stated [employer] needs clarification of lovenox prescription.  Gave phone # [phone].  [facility], they just needed refill authorization.  Gave order to [facility] for 100 mg syringes #30.    Patient aware rx at [facility].

## 2016-08-19 ENCOUNTER — Ambulatory Visit (INDEPENDENT_AMBULATORY_CARE_PROVIDER_SITE_OTHER): Payer: Medicare HMO

## 2016-08-19 ENCOUNTER — Telehealth: Payer: Self-pay | Admitting: *Deleted

## 2016-08-19 ENCOUNTER — Telehealth: Payer: Self-pay

## 2016-08-19 DIAGNOSIS — Z5181 Encounter for therapeutic drug level monitoring: Secondary | ICD-10-CM | POA: Diagnosis not present

## 2016-08-19 DIAGNOSIS — I482 Chronic atrial fibrillation, unspecified: Secondary | ICD-10-CM

## 2016-08-19 DIAGNOSIS — I481 Persistent atrial fibrillation: Secondary | ICD-10-CM | POA: Diagnosis not present

## 2016-08-19 DIAGNOSIS — Z952 Presence of prosthetic heart valve: Secondary | ICD-10-CM | POA: Diagnosis not present

## 2016-08-19 DIAGNOSIS — I4819 Other persistent atrial fibrillation: Secondary | ICD-10-CM

## 2016-08-19 LAB — POCT INR: INR: 2.9

## 2016-08-19 NOTE — Telephone Encounter (Signed)
08/19/2016 (1815) Received request from Director's Administrative staff to address three messages left by patient's wife to discuss a "serious complaint."  Writer returned call to [place] who proceeded to explain how "disgusted" she was about the lack of wheelchairs found in the lobby for her to transport her husband to coumadin clinic today.  Writer offered to meet patient at the door next time with a wheelchair to assist, Mrs Tise stated that was disgusting and she was not happy.  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  Writer to explained it was up to the building management whether or not we could leave wheelchairs downstairs. It was explained to Mrs Michelangelo Radach would be contacted first thing in the morning to discuss the situation and perceived inconvenience to the patient.  Writer stated she would call Mrs Ambush to let her know the outcome of the call to [facility] in the morning.   Georgana Curio MHA RN CCM

## 2016-08-19 NOTE — Patient Instructions (Signed)
08/27/16: Last dose of Coumadin.  08/28/16: No Coumadin or Lovenox.  08/29/16: Inject Lovenox 100mg  in the fatty abdominal tissue at least 2 inches from the belly button twice a day about 12 hours apart, 8am and 8pm rotate sites. No Coumadin.  08/30/16: Inject Lovenox in the fatty tissue every 12 hours, 8am and 8pm. No Coumadin.  08/31/16: Inject Lovenox in the fatty tissue every 12 hours, 8am and 8pm. No Coumadin.  09/01/16: Inject Lovenox in the fatty tissue in the morning at 8 am (No PM dose). No Coumadin.  09/02/16: Procedure Day - No Lovenox - Resume Coumadin in the evening or as directed by doctor (take an extra half tablet with usual dose for 2 days then resume normal dose).  09/03/16: Resume Lovenox inject in the fatty tissue every 12 hours and take Coumadin.  09/04/16: Inject Lovenox in the fatty tissue every 12 hours and take Coumadin.  09/05/16: Inject Lovenox in the fatty tissue every 12 hours and take Coumadin.  09/06/16: Inject Lovenox in the fatty tissue every 12 hours and take Coumadin.  09/07/16: Inject Lovenox in the fatty tissue every 12 hours and take Coumadin.  09/08/16: Coumadin appt to check INR.

## 2016-08-19 NOTE — Telephone Encounter (Signed)
Can he come into see me on the 26th?

## 2016-08-19 NOTE — Telephone Encounter (Signed)
Spoke with patient and he would like to speak with Dr [name] or [place]  Stated he saw [place] around 4 weeks ago and his Lasix was increased to 20 mg in the am and 40 mg in pm The swelling in lower legs seems to be better but suffers from lymphedema and swelling in upper thighs into genitalia  Swelling goes down after medications but when he is up moving around it returns. Having difficulty with the urinating when he is out doing things secondary to the swelling  Would like to discuss with Dr [name] or [place] Advised Dr [name] not in the office tomorrow but would forward to both for review

## 2016-08-20 NOTE — Telephone Encounter (Signed)
08/20/16 1450 (late entry 11:45) Call back to patient's wife to report information learned from [facility] regarding wheelchairs being in the lobby for pt use.  Explained that building management would not permit wheelchairs being left in the lobby. Patients wife requested [place] phone number be left in her voicemail.  This was done.  Georgana Curio MHA RN CCM

## 2016-08-20 NOTE — Telephone Encounter (Signed)
See anti-coag notes from 08/19/16. Patient has been advised on lovenox bridging for procedure on 09/02/16.

## 2016-08-21 ENCOUNTER — Telehealth: Payer: Self-pay | Admitting: Cardiology

## 2016-08-21 NOTE — Telephone Encounter (Signed)
Leave message for pt to call back 

## 2016-08-21 NOTE — Telephone Encounter (Signed)
New message ° °Pt is returning call  ° °Please call back °

## 2016-08-21 NOTE — Telephone Encounter (Signed)
Returned call to patient.He stated he continues to have sob.Stated he is only able to walk 2 to 3 steps without having to stop to breathe.Increase swelling in lower legs.Advised he needs to go to [facility] ER.Spoke to Dr.[name] he advised to go to [facility] ER.

## 2016-08-21 NOTE — Telephone Encounter (Signed)
Follow Up ° °Pt returning call from earlier. Please call. °

## 2016-08-22 ENCOUNTER — Telehealth: Payer: Self-pay | Admitting: Cardiology

## 2016-08-22 ENCOUNTER — Telehealth: Payer: Self-pay | Admitting: *Deleted

## 2016-08-22 NOTE — Telephone Encounter (Signed)
Please call.says she is very upset about the way this is being handled.

## 2016-08-22 NOTE — Telephone Encounter (Signed)
Dr [name] spoke with pt, pt have appt to see Lance Cook on 12/27 @ 8:00 am

## 2016-08-22 NOTE — Telephone Encounter (Signed)
The patient has called multiple times over the past two days.  He has had increased SOB and edema.  It is difficult to understand the extent of symptoms over the phone.  We suggested that if he was having acute symptoms he should go the the ED.  However, he did not want to do this.  When he called this morning we offered him a 12 noon appt but he did not want to do this.  I spoke to him and he said that he wanted to be directly admitted to the hospital if he had to be admitted but that he wasn't sure he needed to be admitted.  He thought he might be able to have his meds adjusted as an out patient.  He did not want to go the the ED.  He has not had good experiences there.  I tried to explain that he could not make decision about whether he needed direct admission or just out patient management without seeing him.  He said he could not come today as he had other "commitments."  He told me that his symptoms were not acute and that he could wait to be seen.  Since he would not go to the ED or refused an appt earlier today I suggested I could see him the very next day that we are open at 7:30 in the morning.  He would not "commit" to this time and date.  He does agree to come on the 27th to be seen by an APP.  He agrees to go to the ED if his symptoms worsen.   The patient and in particular his wife have been angry and demanding with the staff despite our efforts to see him today and offer help through the ED.

## 2016-08-22 NOTE — Telephone Encounter (Signed)
Received a call from Mr Rensberger wife, she very upset and demanding pt to be seen by Dr [name] today, pt and his wife was advised yesterday to go to the ER for his acute symptoms( SOB, unable to walk, difficulty breathing). Pt do not want to go to ER, stated that he will only sit there for hrs and nothing won't be done. Dr [name] have a cancellation today at 12 pm, I called pt letting him know if he want to come in today at 63 but he might still ended up going to the ER, pt stated that he do not want to come in at 41 and if he's have to go to ER, he's not going to come in at all, pt want Dr [name] to directly admit him to the hospital to he can bypass the ER.

## 2016-08-22 NOTE — Telephone Encounter (Signed)
Please see my detailed phone note.

## 2016-08-22 NOTE — Telephone Encounter (Signed)
Follow up    Pt verbalized that he was told 08/21/16 to call today to get an appt with Dr.[name] today

## 2016-08-22 NOTE — Telephone Encounter (Signed)
See previous message-per Dr [name] pt to go to ER  Pt notified-Pt informed to go to the ER if symptoms persist, worsen, or any new symptoms develop verbalizes understanding

## 2016-08-26 ENCOUNTER — Encounter: Payer: Self-pay | Admitting: Gastroenterology

## 2016-08-26 ENCOUNTER — Telehealth: Payer: Self-pay | Admitting: Internal Medicine

## 2016-08-26 ENCOUNTER — Telehealth: Payer: Self-pay | Admitting: Cardiology

## 2016-08-26 NOTE — Telephone Encounter (Signed)
Insurance contacted at [phone] as requested, to follow up on lovenox (pre-authorization) and "double tier exception".   Enoxaparin (lovenox) was approved by insurance from 08-14-16 to 09-14-16 but Tier Excepection was denied at this time.  I contacted patient to informed insurance decision. He will follow up with insurance and re-schedule procedure if needed.  Patient will call [facility] with changes or  addiotional procedures.  Next f/u with coumadin clinic is Jan/8/18

## 2016-08-26 NOTE — Telephone Encounter (Signed)
Error

## 2016-08-26 NOTE — Telephone Encounter (Signed)
New message      Calling to let the pharmacist know that he needs prior authorization for lovenox.  He is trying to get a "double tier exception".  Please call [phone] to get authorization.

## 2016-08-26 NOTE — Telephone Encounter (Signed)
Patient states that he needs to start Lovenox for the procedure on 08/28/16.  Cardiology according to documentation is working on a prior [place].  He is advised that we need to give Cardiology a chance to get it authorized.  He will call back and cancel if they can't get him approved in time to start Lovenox.  I will leave the procedure on for now. He understands he will need to call back to cancel if it does not work out

## 2016-08-27 ENCOUNTER — Inpatient Hospital Stay (HOSPITAL_COMMUNITY)
Admission: AD | Admit: 2016-08-27 | Discharge: 2016-09-03 | DRG: 292 | Disposition: A | Discharge status: Home Health | Payer: Medicare HMO | Source: Ambulatory Visit | Attending: Cardiology | Admitting: Cardiology

## 2016-08-27 ENCOUNTER — Encounter (HOSPITAL_COMMUNITY): Payer: Self-pay | Admitting: General Practice

## 2016-08-27 ENCOUNTER — Ambulatory Visit (INDEPENDENT_AMBULATORY_CARE_PROVIDER_SITE_OTHER): Payer: Medicare HMO | Admitting: Student

## 2016-08-27 ENCOUNTER — Encounter: Payer: Self-pay | Admitting: Student

## 2016-08-27 VITALS — BP 129/67 | HR 68 | Ht 61.0 in | Wt 258.0 lb

## 2016-08-27 DIAGNOSIS — G4733 Obstructive sleep apnea (adult) (pediatric): Secondary | ICD-10-CM

## 2016-08-27 DIAGNOSIS — Z808 Family history of malignant neoplasm of other organs or systems: Secondary | ICD-10-CM

## 2016-08-27 DIAGNOSIS — Z86010 Personal history of colonic polyps: Secondary | ICD-10-CM | POA: Diagnosis not present

## 2016-08-27 DIAGNOSIS — E1151 Type 2 diabetes mellitus with diabetic peripheral angiopathy without gangrene: Secondary | ICD-10-CM | POA: Diagnosis present

## 2016-08-27 DIAGNOSIS — Z8547 Personal history of malignant neoplasm of testis: Secondary | ICD-10-CM | POA: Diagnosis not present

## 2016-08-27 DIAGNOSIS — Z79891 Long term (current) use of opiate analgesic: Secondary | ICD-10-CM

## 2016-08-27 DIAGNOSIS — Z823 Family history of stroke: Secondary | ICD-10-CM | POA: Diagnosis not present

## 2016-08-27 DIAGNOSIS — Z833 Family history of diabetes mellitus: Secondary | ICD-10-CM

## 2016-08-27 DIAGNOSIS — Z9119 Patient's noncompliance with other medical treatment and regimen: Secondary | ICD-10-CM

## 2016-08-27 DIAGNOSIS — K219 Gastro-esophageal reflux disease without esophagitis: Secondary | ICD-10-CM | POA: Diagnosis present

## 2016-08-27 DIAGNOSIS — I10 Essential (primary) hypertension: Secondary | ICD-10-CM | POA: Diagnosis present

## 2016-08-27 DIAGNOSIS — Z8249 Family history of ischemic heart disease and other diseases of the circulatory system: Secondary | ICD-10-CM | POA: Diagnosis not present

## 2016-08-27 DIAGNOSIS — I252 Old myocardial infarction: Secondary | ICD-10-CM

## 2016-08-27 DIAGNOSIS — Z9114 Patient's other noncompliance with medication regimen: Secondary | ICD-10-CM

## 2016-08-27 DIAGNOSIS — I482 Chronic atrial fibrillation, unspecified: Secondary | ICD-10-CM

## 2016-08-27 DIAGNOSIS — Z8673 Personal history of transient ischemic attack (TIA), and cerebral infarction without residual deficits: Secondary | ICD-10-CM | POA: Diagnosis not present

## 2016-08-27 DIAGNOSIS — I5033 Acute on chronic diastolic (congestive) heart failure: Secondary | ICD-10-CM | POA: Diagnosis not present

## 2016-08-27 DIAGNOSIS — Z9049 Acquired absence of other specified parts of digestive tract: Secondary | ICD-10-CM | POA: Diagnosis not present

## 2016-08-27 DIAGNOSIS — Z7901 Long term (current) use of anticoagulants: Secondary | ICD-10-CM

## 2016-08-27 DIAGNOSIS — I2510 Atherosclerotic heart disease of native coronary artery without angina pectoris: Secondary | ICD-10-CM | POA: Diagnosis present

## 2016-08-27 DIAGNOSIS — I7101 Dissection of ascending aorta: Secondary | ICD-10-CM | POA: Diagnosis present

## 2016-08-27 DIAGNOSIS — I5032 Chronic diastolic (congestive) heart failure: Secondary | ICD-10-CM | POA: Diagnosis not present

## 2016-08-27 DIAGNOSIS — I25810 Atherosclerosis of coronary artery bypass graft(s) without angina pectoris: Secondary | ICD-10-CM

## 2016-08-27 DIAGNOSIS — Z951 Presence of aortocoronary bypass graft: Secondary | ICD-10-CM

## 2016-08-27 DIAGNOSIS — I4891 Unspecified atrial fibrillation: Secondary | ICD-10-CM | POA: Diagnosis present

## 2016-08-27 DIAGNOSIS — Z79899 Other long term (current) drug therapy: Secondary | ICD-10-CM

## 2016-08-27 DIAGNOSIS — I110 Hypertensive heart disease with heart failure: Secondary | ICD-10-CM | POA: Diagnosis present

## 2016-08-27 DIAGNOSIS — Z952 Presence of prosthetic heart valve: Secondary | ICD-10-CM

## 2016-08-27 DIAGNOSIS — I872 Venous insufficiency (chronic) (peripheral): Secondary | ICD-10-CM | POA: Diagnosis present

## 2016-08-27 DIAGNOSIS — Z6841 Body Mass Index (BMI) 40.0 and over, adult: Secondary | ICD-10-CM | POA: Diagnosis not present

## 2016-08-27 DIAGNOSIS — E785 Hyperlipidemia, unspecified: Secondary | ICD-10-CM | POA: Diagnosis present

## 2016-08-27 DIAGNOSIS — Z954 Presence of other heart-valve replacement: Secondary | ICD-10-CM

## 2016-08-27 DIAGNOSIS — R0602 Shortness of breath: Secondary | ICD-10-CM | POA: Diagnosis present

## 2016-08-27 DIAGNOSIS — I340 Nonrheumatic mitral (valve) insufficiency: Secondary | ICD-10-CM

## 2016-08-27 DIAGNOSIS — G894 Chronic pain syndrome: Secondary | ICD-10-CM | POA: Diagnosis present

## 2016-08-27 DIAGNOSIS — L299 Pruritus, unspecified: Secondary | ICD-10-CM | POA: Diagnosis present

## 2016-08-27 LAB — BASIC METABOLIC PANEL
Anion gap: 5 (ref 5–15)
BUN: 18 mg/dL (ref 6–20)
CALCIUM: 8.1 mg/dL — AB (ref 8.9–10.3)
CO2: 31 mmol/L (ref 22–32)
CREATININE: 0.93 mg/dL (ref 0.61–1.24)
Chloride: 102 mmol/L (ref 101–111)
GFR calc non Af Amer: >60 mL/min (ref >60)
Glucose, Bld: 68 mg/dL (ref 65–99)
Potassium: 3.9 mmol/L (ref 3.5–5.1)
SODIUM: 138 mmol/L (ref 135–145)

## 2016-08-27 LAB — MRSA PCR SCREENING: MRSA BY PCR: NEGATIVE

## 2016-08-27 LAB — PROTIME-INR
INR: 2.12
PROTHROMBIN TIME: 24.1 s — AB (ref 11.4–15.2)

## 2016-08-27 LAB — BRAIN NATRIURETIC PEPTIDE: B Natriuretic Peptide: 260.5 pg/mL — ABNORMAL HIGH (ref 0.0–100.0)

## 2016-08-27 MED ORDER — OXYCODONE HCL 5 MG PO TABS
5.0000 mg | ORAL_TABLET | Freq: Three times a day (TID) | ORAL | Status: DC | PRN
  Administered 2016-08-27 – 2016-08-30 (×9): 5 mg via ORAL
  Filled 2016-08-27 (×10): qty 1

## 2016-08-27 MED ORDER — TEMAZEPAM 15 MG PO CAPS
15.0000 mg | ORAL_CAPSULE | Freq: Every evening | ORAL | Status: DC | PRN
  Administered 2016-08-27 – 2016-09-02 (×6): 15 mg via ORAL
  Filled 2016-08-27 (×4): qty 1
  Filled 2016-08-27: qty 2
  Filled 2016-08-27 (×2): qty 1

## 2016-08-27 MED ORDER — SODIUM CHLORIDE 0.9 % IV SOLN: 250.0000 mL | INTRAVENOUS | Status: DC | PRN

## 2016-08-27 MED ORDER — WARFARIN - PHARMACIST DOSING INPATIENT
Freq: Every day | Status: DC
  Administered 2016-08-27 – 2016-09-02 (×5)

## 2016-08-27 MED ORDER — SODIUM CHLORIDE 0.9% FLUSH: 3.0000 mL | INTRAVENOUS | Status: DC | PRN

## 2016-08-27 MED ORDER — DIGOXIN 125 MCG PO TABS
250.0000 ug | ORAL_TABLET | Freq: Every day | ORAL | Status: DC
  Administered 2016-08-28 – 2016-09-03 (×7): 250 ug via ORAL
  Filled 2016-08-27 (×7): qty 2

## 2016-08-27 MED ORDER — ATENOLOL 50 MG PO TABS
50.0000 mg | ORAL_TABLET | Freq: Two times a day (BID) | ORAL | Status: DC
  Administered 2016-08-27 – 2016-09-03 (×14): 50 mg via ORAL
  Filled 2016-08-27 (×15): qty 1

## 2016-08-27 MED ORDER — METHOCARBAMOL 500 MG PO TABS
500.0000 mg | ORAL_TABLET | Freq: Three times a day (TID) | ORAL | Status: DC
  Administered 2016-08-27 – 2016-08-30 (×9): 500 mg via ORAL
  Filled 2016-08-27 (×11): qty 1

## 2016-08-27 MED ORDER — POTASSIUM CHLORIDE CRYS ER 20 MEQ PO TBCR
20.0000 meq | EXTENDED_RELEASE_TABLET | Freq: Two times a day (BID) | ORAL | Status: DC
  Administered 2016-08-27 – 2016-08-28 (×2): 20 meq via ORAL
  Filled 2016-08-27: qty 1
  Filled 2016-08-27 (×3): qty 2
  Filled 2016-08-27 (×2): qty 1

## 2016-08-27 MED ORDER — FUROSEMIDE 10 MG/ML IJ SOLN
40.0000 mg | Freq: Two times a day (BID) | INTRAMUSCULAR | Status: DC
  Administered 2016-08-27 – 2016-08-28 (×3): 40 mg via INTRAVENOUS
  Filled 2016-08-27 (×3): qty 4

## 2016-08-27 MED ORDER — ONDANSETRON HCL 4 MG/2ML IJ SOLN: 4.0000 mg | Freq: Four times a day (QID) | INTRAMUSCULAR | Status: DC | PRN

## 2016-08-27 MED ORDER — ACETAMINOPHEN 325 MG PO TABS
650.0000 mg | ORAL_TABLET | ORAL | Status: DC | PRN
  Administered 2016-08-28 – 2016-09-01 (×2): 650 mg via ORAL
  Filled 2016-08-27 (×3): qty 2

## 2016-08-27 MED ORDER — WARFARIN SODIUM 7.5 MG PO TABS
7.5000 mg | ORAL_TABLET | Freq: Once | ORAL | Status: AC
  Administered 2016-08-27: 7.5 mg via ORAL
  Filled 2016-08-27: qty 1

## 2016-08-27 MED ORDER — ALLOPURINOL 300 MG PO TABS
300.0000 mg | ORAL_TABLET | Freq: Every day | ORAL | Status: DC
  Administered 2016-08-28 – 2016-09-03 (×7): 300 mg via ORAL
  Filled 2016-08-27 (×7): qty 1

## 2016-08-27 MED ORDER — SODIUM CHLORIDE 0.9% FLUSH
3.0000 mL | Freq: Two times a day (BID) | INTRAVENOUS | Status: DC
  Administered 2016-08-28 – 2016-09-03 (×12): 3 mL via INTRAVENOUS

## 2016-08-27 MED ORDER — MAGNESIUM OXIDE 400 (241.3 MG) MG PO TABS
400.0000 mg | ORAL_TABLET | Freq: Two times a day (BID) | ORAL | Status: DC
  Administered 2016-08-27 – 2016-09-03 (×14): 400 mg via ORAL
  Filled 2016-08-27 (×14): qty 1

## 2016-08-27 NOTE — H&P (Signed)
Cardiology History and Physical   Date:  08/27/2016   ID:  Lance Cook, DOB 27-Dec-1960, MRN [id number]  PCP:  Lance Dials, PA-C  Cardiologist: Dr. [name]   Chief Complaint  Patient presents with  . Follow-up  . Shortness of Breath  . Edema    History of Present Illness:    Lance Cook is a 55 y.o. male with past medical history of AVR (s/p Bentall procedure in 1988), severe mitral regurgitation (s/p St Jude mechanical valve placed in 12/2014), CAD (s/p SVG-RCA, known to be occluded by cath in 12/2014), chronic atrial fibrillation, chronic diastolic CHF, prior CVA, chronic venous insufficiency, OSA, OSA (on CPAP) and recurrent C. difficile infection who presents to the office today for evaluation of worsening dyspnea, orthopnea, and edema.  Was last seen by myself on 07/14/2016 for ER follow-up. Reported worsening lower extremity edema despite using his compression machine for his chronic lymphedema.  Weight at the time of his office visit was 239 lbs (up from 230 lbs in 10/17). BNP was minimally elevated at 168. His Lasix dosing was increased from 20mg  BID to 20mg  in AM/40mg  in PM.   In the interim, he called the office 6 days ago saying he was barely barely able to walk 2-3 steps without having significant dyspnea. He was advised to go to the ER but did not wish to do this.   In talking with the patient today, he reports the increased Lasix dosing worked for approximately one week, having achieved a weight of 226 lbs on 11/27. However, the increased diuretic dosing interfered with household duties as he is the only source of transportation for his family. Therefore, he frequently skipped morning doses of Lasix and would only take 20mg  Lasix at night instead of the recommended 40mg . He has also been using his lower extremity compression devices 1-2 times per week instead of 4 times daily as prescribed.   His edema has progressed to the extent he is having significant difficulty  walking. Also reports difficulty urinating due to scrotal edema. Sleeps with 2-3 pillows at night and with his CPAP machine, but still having orthopnea. Denies any chest discomfort or palpitations.    Past Medical History:  Diagnosis Date  . Acute respiratory failure with hypoxemia ([place])   . Anemia   . Anxiety   . Aortic aneurysm ([place])   . Aortic aneurysm and dissection (Lance Cook)   . ARDS (adult respiratory distress syndrome) ([place]) 01/27/2015  . Arthritis   . Ascending aortic dissection ([place]) 07/14/2008   Localized dissection of ascending aorta noted on CTA in 2009 and stable on CTA in 2011  . Asymptomatic chronic venous hypertension 01/15/2010   Overview:  Overview:  Qualifier: Diagnosis of  By: [name] MD, [name]   Last Assessment & Plan:  I suggested he go to a vein clinic to see if he could be qualified compression hose of some type, since he is not a surgical candidate according to the vascular surgeons.   . Atrial fibrillation ([place])    chronic persistent  . Bell's palsy   . CAD (coronary artery disease)    Old scar inferior wall myoview, 10/2009 EF 52%.  He did have previous SVG to RCA but no obstructive disease noted on his most recent cath.  SVG occluded.   . Cellulitis 04/02/2014  . Cerebral artery occlusion with cerebral infarction ([place]) 10/08/2011   Overview:  Overview:  And hx of TIA prior to CABG, all thought due to systemic emboli prior to  coumadin   . CHF (congestive heart failure) ([place])   . Chronic diastolic congestive heart failure ([place])   . Chronic diastolic heart failure ([place]) 03/01/2015  . Chronic LBP 10/08/2011  . CVA (cerebral vascular accident) ([place]) 10/08/2011   And hx of TIA prior to CABG, all thought due to systemic emboli prior to coumadin   . Diabetes mellitus    diet controlled  . DIABETES MELLITUS, TYPE II 11/01/2009   Qualifier: Diagnosis of  By: [name] MD, [name]    . Diverticulosis   . Dysrhythmia    Dr. [name] follows-LOV 05-08-16 Epic.  . ED (erectile  dysfunction) of organic origin 10/08/2011   Overview:  Last Assessment & Plan:  S/p unilateral orchiectomy, for testosterone level, consider androgel pump 1.62    . Encephalopathy acute 02/05/2015  . Erectile dysfunction 10/08/2011  . GERD (gastroesophageal reflux disease)    not needing medication at thhis time- 01/22/15  . Gout   . H/O mechanical aortic valve replacement 11/01/2009   Qualifier: Diagnosis of  By: [name], [name]    . Hiatal hernia   . History of adenomatous polyp of colon 12/23/2011   Colonoscopy January 2010, 6 mm rectal tubulovillous adenoma. No high-grade dysplasia   . History of colon polyps 12/23/2011   Overview:  Overview:  Colonoscopy January 2010, 6 mm rectal tubulovillous adenoma. No high-grade dysplasia   . Hyperlipidemia   . Hypertension   . Hypogonadism male 04/08/2012  . Impaired glucose tolerance 10/08/2011  . Leg pain 06/28/2010  . Morbid obesity ([place]) 04/02/2014  . Myocardial infarction age 85  . Obesity   . OSA (obstructive sleep apnea)    CPAP  . Peripheral vascular disease ([place])   . Reflux 11/06/09  . 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 [id number]   33 mm St Jude bileaflet mechanical valve placed via right mini thoracotomy approach  . 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    1988 - Dr Blase Mess at [facility] in [name], [place]  . Severe mitral regurgitation 10/11/2014  . Shortness of breath dyspnea    with exertion  . Stroke [facility])    TIA and Stroke no residual effect- 1st was 22  . Testicular cancer ([place])   . TIA (transient ischemic attack)    age 65  . Varicose veins     Past Surgical History:  Procedure Laterality Date  . aortic truck    . BENTALL PROCEDURE  1988   25 mm St Jude mechanical valve conduit - Dr Blase Mess at [facility] in [place], [place]  . CHOLECYSTECTOMY  2011  . COLONOSCOPY WITH PROPOFOL N/A 06/17/2016     Procedure: COLONOSCOPY WITH PROPOFOL;  Surgeon: Jerene Bears, MD;  Location: [facility] ENDOSCOPY;  Service: Gastroenterology;  Laterality: N/A;  . CORONARY ARTERY BYPASS GRAFT    . LASER ABLATION  03/06/2010   left leg  . MITRAL VALVE REPLACEMENT Right 01/24/2015   Procedure: Re-Operation, MINIMALLY INVASIVE MITRAL VALVE (MV) REPLACEMENT.;  Surgeon: Rexene Alberts, MD;  Location: [place];  Service: Open Heart Surgery;  Laterality: Right;  . ORCHIECTOMY  age 35   testicular cancer  . OTOPLASTY     bilateral, age 26  . TEE WITHOUT CARDIOVERSION N/A 10/11/2014   Procedure: TRANSESOPHAGEAL ECHOCARDIOGRAM (TEE);  Surgeon: Pixie Casino, MD;  Location[facility] ENDOSCOPY;  Service: Cardiovascular;  Laterality: N/A;  . TEE WITHOUT CARDIOVERSION  N/A 01/24/2015   Procedure: TRANSESOPHAGEAL ECHOCARDIOGRAM (TEE);  Surgeon: Rexene Alberts, MD;  Location: [place];  Service: Open Heart Surgery;  Laterality: N/A;  . TONSILLECTOMY  1967  . TOOTH EXTRACTION  2016    Current Medications: Outpatient Medications Prior to Visit  Medication Sig Dispense Refill  . acetaminophen (TYLENOL) 500 MG tablet Take 500 mg by mouth 2 (two) times daily.    Marland Kitchen allopurinol (ZYLOPRIM) 300 MG tablet Take 300 mg by mouth daily.    Marland Kitchen atenolol (TENORMIN) 100 MG tablet Take 0.5 tablets (50 mg total) by mouth 2 (two) times daily.    Marland Kitchen b complex vitamins tablet Take 1 tablet by mouth daily.      . bacitracin 500 UNIT/GM ointment Apply 1 application topically as needed for wound care.     . Calcium-Magnesium-Zinc (CAL-MAG-ZINC PO) Take 1 tablet by mouth daily.     . clindamycin (CLEOCIN) 300 MG capsule Take 300 mg by mouth as needed (for dental appointments).     . digoxin (LANOXIN) 0.25 MG tablet TAKE 1 TABLET EVERY DAY 90 tablet 2  . enoxaparin (LOVENOX) 100 MG/ML injection INJECT 1 ML (100 MG TOTAL) INTO THE SKIN EVERY 12  HOURS. 30 mL 0  . furosemide (LASIX) 20 MG tablet TAKE 1 TABLET IN THE MORNING AND 2 TABLETS (40MG ) IN THE EVENING 30  tablet 3  . Glucose Blood (BLOOD GLUCOSE TEST STRIPS) STRP Use as instructed    . magnesium oxide (MAG-OX) 400 (241.3 MG) MG tablet Take 1 tablet (400 mg total) by mouth 2 (two) times daily. 180 tablet 3  . methocarbamol (ROBAXIN) 500 MG tablet Take 1 tablet (500 mg total) by mouth 3 (three) times daily. 90 tablet 2  . oxyCODONE (OXY IR/ROXICODONE) 5 MG immediate release tablet Take 1 tablet (5 mg total) by mouth every 8 (eight) hours as needed for severe pain. 90 tablet 0  . Potassium 99 MG TABS Take 99 mg by mouth daily.    . potassium chloride (K-DUR) 10 MEQ tablet Take 2 tablets (20 mEq total) by mouth 2 (two) times daily. 120 tablet 1  . temazepam (RESTORIL) 15 MG capsule Take 15 mg by mouth at bedtime as needed for sleep.    Marland Kitchen warfarin (COUMADIN) 5 MG tablet TAKE 1 TO 1 AND 1/2 TABLETS DAILY AS DIRECTED BY COUMADIN CLINIC 135 tablet 0  . warfarin (COUMADIN) 5 MG tablet Take 5-10 mg by mouth See admin instructions. Alternate taking 5 mg one day the 7.5 mg the other day Take 10 mg on 06/24/16    . oxyCODONE (OXY IR/ROXICODONE) 5 MG immediate release tablet Take 1 tablet (5 mg total) by mouth every 8 (eight) hours as needed for severe pain. 90 tablet 0   No facility-administered medications prior to visit.      Allergies:   Penicillins; Adhesive [tape]; and Bactrim [sulfamethoxazole-trimethoprim]   Social History   Social History  . Marital status: Married    Spouse name: N/A  . Number of children: 0  . Years of education: N/A   Occupational History  . Disabled Optician Unemployed   Social History Main Topics  . Smoking status: Light Tobacco Smoker    Types: Cigars  . Smokeless tobacco: Never Used     Comment: about 3 yearly- cigar  . Alcohol use 0.0 oz/week     Comment: social  . Drug use: No  . Sexual activity: Not Asked   Other Topics Concern  . None   Social  History Narrative  . None     Family History:  The patient's family history includes Cancer in his maternal  aunt and maternal uncle; Diabetes in his father; Heart disease in his mother; Hypertension in his other; Other in his mother; Stroke in his other; Throat cancer in his mother.   Review of Systems:   Please see the history of present illness.     General:  No chills, fever, night sweats or weight changes.  Cardiovascular:  No chest pain, palpitations, paroxysmal nocturnal dyspnea. Positive for dyspnea on exertion, edema, and orthopnea. Dermatological: No rash, lesions/masses Respiratory: No cough, Positive for dyspnea Urologic: No hematuria, dysuria Abdominal:   No nausea, vomiting, diarrhea, bright red blood per rectum, melena, or hematemesis Neurologic:  No visual changes, wkns, changes in mental status. All other systems reviewed and are otherwise negative except as noted above.   Physical Exam:    VS:  BP 129/67   Pulse 68   Ht 5\' 1"  (1.549 m)   Wt 258 lb (117 kg)   SpO2 97%   BMI 48.75 kg/m    General: Well developed, Obese Caucasian male appearing in no acute distress. Head: Normocephalic, atraumatic, sclera non-icteric, no xanthomas, nares are without discharge.  Neck: No carotid bruits. JVD at 9 cm. Lungs: Respirations regular and unlabored, decreased breath sounds at bases bilaterally Heart: Irregularly irregular. No S3 or S4. No rubs or gallops appreciated. Crisp valve sounds with a 2/6 SEM best appreciated at the apex. Abdomen: Soft, non-tender, non-distended with normoactive bowel sounds. No hepatomegaly. No rebound/guarding. No obvious abdominal masses. Pitting edema along abdomen and flank region.  Msk:  Strength and tone appear normal for age. No joint deformities or effusions. Extremities: No clubbing or cyanosis. Significant 2++ pitting edema up to thighs bilaterally.  Distal pedal pulses are 2+ bilaterally. Neuro: Alert and oriented X 3. Moves all extremities spontaneously. No focal deficits noted. Psych:  Responds to questions appropriately with a normal  affect. Skin: No rashes or lesions noted  Wt Readings from Last 3 Encounters:  08/27/16 258 lb (117 kg)  08/15/16 244 lb 3.2 oz (110.8 kg)  07/28/16 226 lb (102.5 kg)      Studies/Labs Reviewed:   EKG:  EKG is not ordered today.    Recent Labs: 10/09/2015: Magnesium 1.8 02/05/2016: ALT 19 06/25/2016: Hemoglobin 8.6; Platelets 168 07/14/2016: Brain Natriuretic Peptide 168.9; BUN 20; Creat 0.85; Potassium 4.5; Sodium 141   Lipid Panel    Component Value Date/Time   CHOL 118 08/18/2012 1137   TRIG 133.0 08/18/2012 1137   HDL 27.20 (L) 08/18/2012 1137   CHOLHDL 4 08/18/2012 1137   VLDL 26.6 08/18/2012 1137   LDLCALC 64 08/18/2012 1137    Additional studies/ records that were reviewed today include:   Echocardiogram: 12/2015 Study Conclusions  - Left ventricle: The cavity size was normal. There was severe   concentric hypertrophy. Systolic function was vigorous. The   estimated ejection fraction was in the range of 65% to 70%. Wall   motion was normal; there were no regional wall motion   abnormalities. The study is not technically sufficient to allow   evaluation of LV diastolic function. - Aortic valve: A mechanical prosthesis was present and functioning   normally. Peak velocity (S): 345 cm/s. Mean gradient (S): 28 mm   Hg. (Consistent with moderate aortic stenosis gradient) - Aorta: Post Bentall with ST Jude Mechanical Valve in 1988. Aortic   arch diameter: 40 mm. - Aortic arch: The aortic  arch was mildly dilated. - Mitral valve: A mechanical prosthesis was present and functioning   normally. - Left atrium: The atrium was severely dilated. - Right ventricle: The cavity size was mildly dilated. Wall   thickness was normal. - Right atrium: The atrium was moderately dilated. - Atrial septum: The septum bowed from left to right, consistent   with increased left atrial pressure. - Tricuspid valve: There was moderate regurgitation. - Pulmonary arteries: Systolic  pressure was moderately to severely   increased. PA peak pressure: 67 mm Hg (S).  Impressions:  - There was no evidence of a vegetation.   Assessment:    1. Acute on chronic diastolic CHF (congestive heart failure) ([place])   2. Chronic atrial fibrillation (HCC)   3. Venous insufficiency of both lower extremities   4. Coronary artery disease involving coronary bypass graft of native heart without angina pectoris   5. Severe mitral regurgitation   6. OSA (obstructive sleep apnea)      Plan:   In order of problems listed above:  1. Acute on Chronic Diastolic CHF - has experienced increasing weight gain from 226 lbs on 11/27 to 258 lbs at today's office visit. He is significantly volume overloaded by physical exam and is experiencing worsening dyspnea with exertion and orthopnea.  - he has been noncompliant with increased PO Lasix dosing and with his presenting symptoms and him being 32 lbs up from his dry weight, he will require admission and IV diuresis. This was discussed with Dr. [name] who is in agreement with this assessment and plan.  - will start IV Lasix 40mg  BID which will need to be further adjusted pending his response. Obtain strict I&O's and daily weights. Will obtain a BMET and BNP upon arrival to the hospital.   2. Chronic Atrial Fibrillation - This patients CHA2DS2-VASc Score and unadjusted Ischemic Stroke Rate (% per year) is equal to 3.2 % stroke rate/year from a score of 3 (CHF, HTN, Vascular). Continue Coumadin for anticoagulation.  - HR well-controlled. Continue BB.   3. Venous Insufficiency of Both Extremities - followed by Vascular Clinic at [facility]. Has been instructed to use his compression machine for 6+ hours daily to assist with his edema, but uses this intermittently. Compliance with this regimen strongly encouraged. Was instructed on the importance of elevation of his lower extremities while admitted.   4. CAD without Angina Pectoris - s/p SVG-RCA, known  to be occluded by cath in 12/2014.  - denies any recent anginal symptoms.   5. Severe MR - s/p St Jude mechanical valve placed in 12/2014. - continue Coumadin per Pharmacy dosing during admission.   6. OSA -continue CPAP. Will order during admission.    Medication Adjustments/Labs and Tests Ordered: Current medicines are reviewed at length with the patient today.  Concerns regarding medicines are outlined above.  Medication changes, Labs and Tests ordered today are listed in the Patient Instructions below.  Patient Instructions  You may go home and get any items you need.  You will need to go on to [facility] thru the admitting department You will have a bed on [address] [place], [place]  08/27/2016 9:57 AM    [facility] [place], [place], [place]  [address] Phone: [phone]; Fax: [phone]  [address] [place], [place] [address] Phone: [phone]   History and all data above reviewed.  Patient examined.  I agree with the findings  as above. The patient has had progressive edema and SOB with severe weight gain and anasarca.  He has been non compliant with medications and doesn't weight himself routinely.    The patient exam reveals COR: S1 WNL, mechanical S2, no S3, no clicks, no rubs, apical systolic early peaking murmur, irregular   ,  Lungs: bilateral basilar crackles ,  Abd: Positive bowel sounds, no rebound no guarding, Ext Severe edema to mid back with lymphedema  .  All available labs, radiology testing, previous records reviewed. Agree with documented assessment and plan. Anasarca:  Needs to be admitted.  He has acute on chronic severe diastolic HF with his situation complicated by chronic medical non compliance.  He needs to be admitted for repeat echo and IV diuresis.  He does have baseline lymphedema his exam is markedly volume overloaded.    Jeneen Rinks Makalya Nave  4:48 PM  08/27/2016

## 2016-08-27 NOTE — Progress Notes (Signed)
ANTICOAGULATION CONSULT NOTE - Initial Consult  Pharmacy Consult for Warfarin Indication: atrial fibrillation and mechanical mitral valve  Assessment: 55 yoM with CHF admitted 12/27 for worsening dyspnea, orthopnea, and edema after an office visit. Patient has a Probation officer mitral valve placed in 12/2014. Prior to admission, patient was anticoagulated with warfarin. Last outpatient INR visit was 12/19 - patient was on 5mg  MWF, 7.5mg  TuThSaSu with an INR of 2.9 (therapeutic).   INR on admission is 2.12 (subtherapeutic). CBC pending. No bleeding noted.   Goal of Therapy:  INR 2.5 - 3.5 Monitor platelets by anticoagulation protocol: Yes   Plan:  Warfarin 7.5mg  x 1 dose tonight Monitor daily INR and CBC as needed Monitor s/sx's of bleeding   Allergies  Allergen Reactions  . Penicillins     REACTION: Hives Has patient had a PCN reaction causing immediate rash, facial/tongue/throat swelling, SOB or lightheadedness with hypotension: unsure Has patient had a PCN reaction causing severe rash involving mucus membranes or skin necrosis: unsure Has patient had a PCN reaction that required hospitalization unsure Has patient had a PCN reaction occurring within the last 10 years: yes If all of the above answers are "NO", then may proceed with Cephalosporin use.  . Adhesive [Tape] Other (See Comments)    Shin irritation  . Bactrim [Sulfamethoxazole-Trimethoprim]     Patient Measurements: Height: 5\' 1"  (154.9 cm) Weight: 248 lb 3.2 oz (112.6 kg) IBW/kg (Calculated) : 52.3  Vital Signs: BP: 129/67 (12/27 0822) Pulse Rate: 68 (12/27 0822)  Labs: No results for input(s): HGB, HCT, PLT, APTT, LABPROT, INR, HEPARINUNFRC, HEPRLOWMOCWT, CREATININE, CKTOTAL, CKMB, TROPONINI in the last 72 hours.  CrCl cannot be calculated (Patient's most recent lab result is older than the maximum 21 days allowed.).   Medical History: Past Medical History:  Diagnosis Date  . Acute respiratory failure  with hypoxemia ([place])   . Anemia   . Anxiety   . Aortic aneurysm ([place])   . Aortic aneurysm and dissection ([place])   . ARDS (adult respiratory distress syndrome) ([place]) 01/27/2015  . Arthritis   . Ascending aortic dissection ([place]) 07/14/2008   Localized dissection of ascending aorta noted on CTA in 2009 and stable on CTA in 2011  . Asymptomatic chronic venous hypertension 01/15/2010   Overview:  Overview:  Qualifier: Diagnosis of  By: [name] MD, [name]   Last Assessment & Plan:  I suggested he go to a vein clinic to see if he could be qualified compression hose of some type, since he is not a surgical candidate according to the vascular surgeons.   . Atrial fibrillation ([place])    chronic persistent  . Bell's palsy   . CAD (coronary artery disease)    Old scar inferior wall myoview, 10/2009 EF 52%.  He did have previous SVG to RCA but no obstructive disease noted on his most recent cath.  SVG occluded.   . Cellulitis 04/02/2014  . Cerebral artery occlusion with cerebral infarction ([place]) 10/08/2011   Overview:  Overview:  And hx of TIA prior to CABG, all thought due to systemic emboli prior to coumadin   . CHF (congestive heart failure) ([place])   . Chronic diastolic congestive heart failure ([place])   . Chronic diastolic heart failure ([place]) 03/01/2015  . Chronic LBP 10/08/2011  . CVA (cerebral vascular accident) ([place]) 10/08/2011   And hx of TIA prior to CABG, all thought due to systemic emboli prior to coumadin   . Diabetes mellitus    diet controlled  .  DIABETES MELLITUS, TYPE II 11/01/2009   Qualifier: Diagnosis of  By: [name] MD, [name]    . Diverticulosis   . Dysrhythmia    Dr. [name] follows-LOV 05-08-16 Epic.  . 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 Encephalopathy acute 02/05/2015  . Erectile dysfunction 10/08/2011  . GERD (gastroesophageal reflux disease)     not needing medication at thhis time- 01/22/15  . Gout   . H/O mechanical aortic valve replacement 11/01/2009   Qualifier: Diagnosis of  By: [name], [name]    . Hiatal hernia   . History of adenomatous polyp of colon 12/23/2011   Colonoscopy January 2010, 6 mm rectal tubulovillous adenoma. No high-grade dysplasia   . History of colon polyps 12/23/2011   Overview:  Overview:  Colonoscopy January 2010, 6 mm rectal tubulovillous adenoma. No high-grade dysplasia   . Hyperlipidemia   . Hypertension   . Hypogonadism male 04/08/2012  . Impaired glucose tolerance 10/08/2011  . Leg pain 06/28/2010  . Morbid obesity ([place]) 04/02/2014  . Myocardial infarction age 23  . Obesity   . OSA (obstructive sleep apnea)    CPAP  . Peripheral vascular disease ([place])   . Reflux 11/06/09  . 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 [id number]   33 mm St Jude bileaflet mechanical valve placed via right mini thoracotomy approach  . 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    1988 - Dr Blase Mess at [facility] in [place], [place]  . Severe mitral regurgitation 10/11/2014  . Shortness of breath dyspnea    with exertion  . Stroke [facility])    TIA and Stroke no residual effect- 1st was 60  . Testicular cancer ([place])   . TIA (transient ischemic attack)    age 33  . Varicose veins     Medications:  Scheduled:  . [START ON 08/28/2016] allopurinol  300 mg Oral Daily  . atenolol  50 mg Oral BID  . [START ON 08/28/2016] digoxin  250 mcg Oral Daily  . furosemide  40 mg Intravenous BID  . magnesium oxide  400 mg Oral BID  . methocarbamol  500 mg Oral TID  . potassium chloride  20 mEq Oral BID  . sodium chloride flush  3 mL Intravenous Q12H  . warfarin  7.5 mg Oral ONCE-1800  . Warfarin - Pharmacist Dosing Inpatient   Does not apply [id number]    Belia Heman, PharmD PGY1 Pharmacy Resident [phone] (Pager) 08/27/2016 1:52 PM

## 2016-08-27 NOTE — Patient Instructions (Signed)
You may go home and get any items you need.  You will need to go on to [facility] thru the admitting department You will have a bed on [place]

## 2016-08-27 NOTE — Progress Notes (Signed)
Attempted to place foley catheter twice by 2 RN's. Urethra is not visible due to edema. Cards NP text paged for coude cath.

## 2016-08-27 NOTE — Progress Notes (Signed)
Cardiology Office Note    Date:  08/27/2016   ID:  Lance Cook, DOB 1960/12/04, MRN [id number]  PCP:  [name], PA-C  Cardiologist: Dr. [name]   Chief Complaint  Patient presents with  . Follow-up  . Shortness of Breath  . Edema    History of Present Illness:    Lance Cook is a 55 y.o. male with past medical history of AVR (s/p Bentall procedure in 1988), severe mitral regurgitation (s/p St Jude mechanical valve placed in 12/2014), CAD (s/p SVG-RCA, known to be occluded by cath in 12/2014), chronic atrial fibrillation, chronic diastolic CHF, prior CVA, chronic venous insufficiency, OSA, OSA (on CPAP) and recurrent C. difficile infection who presents to the office today for evaluation of worsening dyspnea, orthopnea, and edema.  Was last seen by myself on 07/14/2016 for ER follow-up. Reported worsening lower extremity edema despite using his compression machine for his chronic lymphedema.  Weight at the time of his office visit was 239 lbs (up from 230 lbs in 10/17). BNP was minimally elevated at 168. His Lasix dosing was increased from 20mg  BID to 20mg  in AM/40mg  in PM.   In the interim, he called the office 6 days ago saying he was barely barely able to walk 2-3 steps without having significant dyspnea. He was advised to go to the ER but did not wish to do this.   In talking with the patient today, he reports the increased Lasix dosing worked for approximately one week, having achieved a weight of 226 lbs on 11/27. However, the increased diuretic dosing interfered with household duties as he is the only source of transportation for his family. Therefore, he frequently skipped morning doses of Lasix and would only take 20mg  Lasix at night instead of the recommended 40mg . He has also been using his lower extremity compression devices 1-2 times per week instead of 4 times daily as prescribed.   His edema has progressed to the extent he is having significant difficulty walking. Also  reports difficulty urinating due to scrotal edema. Sleeps with 2-3 pillows at night and with his CPAP machine, but still having orthopnea. Denies any chest discomfort or palpitations.    Past Medical History:  Diagnosis Date  . Acute respiratory failure with hypoxemia ([place])   . Anemia   . Anxiety   . Aortic aneurysm ([place])   . Aortic aneurysm and dissection ([place])   . ARDS (adult respiratory distress syndrome) ([place]) 01/27/2015  . Arthritis   . Ascending aortic dissection ([place]) 07/14/2008   Localized dissection of ascending aorta noted on CTA in 2009 and stable on CTA in 2011  . Asymptomatic chronic venous hypertension 01/15/2010   Overview:  Overview:  Qualifier: Diagnosis of  By: [name] MD, [name]   Last Assessment & Plan:  I suggested he go to a vein clinic to see if he could be qualified compression hose of some type, since he is not a surgical candidate according to the vascular surgeons.   . Atrial fibrillation ([place])    chronic persistent  . Bell's palsy   . CAD (coronary artery disease)    Old scar inferior wall myoview, 10/2009 EF 52%.  He did have previous SVG to RCA but no obstructive disease noted on his most recent cath.  SVG occluded.   . Cellulitis 04/02/2014  . Cerebral artery occlusion with cerebral infarction ([place]) 10/08/2011   Overview:  Overview:  And hx of TIA prior to CABG, all thought due to systemic emboli prior to  coumadin   . CHF (congestive heart failure) ([place])   . Chronic diastolic congestive heart failure ([place])   . Chronic diastolic heart failure ([place]) 03/01/2015  . Chronic LBP 10/08/2011  . CVA (cerebral vascular accident) ([place]) 10/08/2011   And hx of TIA prior to CABG, all thought due to systemic emboli prior to coumadin   . Diabetes mellitus    diet controlled  . DIABETES MELLITUS, TYPE II 11/01/2009   Qualifier: Diagnosis of  By: [name] MD, [name]    . Diverticulosis   . Dysrhythmia    Dr. [name] follows-LOV 05-08-16 Epic.  . ED (erectile dysfunction) of  organic origin 10/08/2011   Overview:  Last Assessment & Plan:  S/p unilateral orchiectomy, for testosterone level, consider androgel pump 1.62    . Encephalopathy acute 02/05/2015  . Erectile dysfunction 10/08/2011  . GERD (gastroesophageal reflux disease)    not needing medication at thhis time- 01/22/15  . Gout   . H/O mechanical aortic valve replacement 11/01/2009   Qualifier: Diagnosis of  By: [name], [name]    . Hiatal hernia   . History of adenomatous polyp of colon 12/23/2011   Colonoscopy January 2010, 6 mm rectal tubulovillous adenoma. No high-grade dysplasia   . History of colon polyps 12/23/2011   Overview:  Overview:  Colonoscopy January 2010, 6 mm rectal tubulovillous adenoma. No high-grade dysplasia   . Hyperlipidemia   . Hypertension   . Hypogonadism male 04/08/2012  . Impaired glucose tolerance 10/08/2011  . Leg pain 06/28/2010  . Morbid obesity ([place]) 04/02/2014  . Myocardial infarction age 53  . Obesity   . OSA (obstructive sleep apnea)    CPAP  . Peripheral vascular disease ([place])   . Reflux 11/06/09  . 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 [id number]   33 mm St Jude bileaflet mechanical valve placed via right mini thoracotomy approach  . 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    1988 - Dr Blase Mess at [facility] in [place], [place]  . Severe mitral regurgitation 10/11/2014  . Shortness of breath dyspnea    with exertion  . Stroke [facility])    TIA and Stroke no residual effect- 1st was 62  . Testicular cancer ([place])   . TIA (transient ischemic attack)    age 26  . Varicose veins     Past Surgical History:  Procedure Laterality Date  . aortic truck    . BENTALL PROCEDURE  1988   25 mm St Jude mechanical valve conduit - Dr Blase Mess at [facility] in [place], [place]  . CHOLECYSTECTOMY  2011  . COLONOSCOPY WITH PROPOFOL N/A 06/17/2016   Procedure:  COLONOSCOPY WITH PROPOFOL;  Surgeon: Jerene Bears, MD;  Location: [facility] ENDOSCOPY;  Service: Gastroenterology;  Laterality: N/A;  . CORONARY ARTERY BYPASS GRAFT    . LASER ABLATION  03/06/2010   left leg  . MITRAL VALVE REPLACEMENT Right 01/24/2015   Procedure: Re-Operation, MINIMALLY INVASIVE MITRAL VALVE (MV) REPLACEMENT.;  Surgeon: Rexene Alberts, MD;  Location: [place];  Service: Open Heart Surgery;  Laterality: Right;  . ORCHIECTOMY  age 40   testicular cancer  . OTOPLASTY     bilateral, age 7  . TEE WITHOUT CARDIOVERSION N/A 10/11/2014   Procedure: TRANSESOPHAGEAL ECHOCARDIOGRAM (TEE);  Surgeon: Pixie Casino, MD;  Location: [facility] ENDOSCOPY;  Service: Cardiovascular;  Laterality: N/A;  . TEE WITHOUT CARDIOVERSION N/A  01/24/2015   Procedure: TRANSESOPHAGEAL ECHOCARDIOGRAM (TEE);  Surgeon: Rexene Alberts, MD;  Location: [place];  Service: Open Heart Surgery;  Laterality: N/A;  . TONSILLECTOMY  1967  . TOOTH EXTRACTION  2016    Current Medications: Outpatient Medications Prior to Visit  Medication Sig Dispense Refill  . acetaminophen (TYLENOL) 500 MG tablet Take 500 mg by mouth 2 (two) times daily.    Marland Kitchen allopurinol (ZYLOPRIM) 300 MG tablet Take 300 mg by mouth daily.    Marland Kitchen atenolol (TENORMIN) 100 MG tablet Take 0.5 tablets (50 mg total) by mouth 2 (two) times daily.    Marland Kitchen b complex vitamins tablet Take 1 tablet by mouth daily.      . bacitracin 500 UNIT/GM ointment Apply 1 application topically as needed for wound care.     . Calcium-Magnesium-Zinc (CAL-MAG-ZINC PO) Take 1 tablet by mouth daily.     . clindamycin (CLEOCIN) 300 MG capsule Take 300 mg by mouth as needed (for dental appointments).     . digoxin (LANOXIN) 0.25 MG tablet TAKE 1 TABLET EVERY DAY 90 tablet 2  . enoxaparin (LOVENOX) 100 MG/ML injection INJECT 1 ML (100 MG TOTAL) INTO THE SKIN EVERY 12  HOURS. 30 mL 0  . furosemide (LASIX) 20 MG tablet TAKE 1 TABLET IN THE MORNING AND 2 TABLETS (40MG ) IN THE EVENING 30 tablet 3  .  Glucose Blood (BLOOD GLUCOSE TEST STRIPS) STRP Use as instructed    . magnesium oxide (MAG-OX) 400 (241.3 MG) MG tablet Take 1 tablet (400 mg total) by mouth 2 (two) times daily. 180 tablet 3  . methocarbamol (ROBAXIN) 500 MG tablet Take 1 tablet (500 mg total) by mouth 3 (three) times daily. 90 tablet 2  . oxyCODONE (OXY IR/ROXICODONE) 5 MG immediate release tablet Take 1 tablet (5 mg total) by mouth every 8 (eight) hours as needed for severe pain. 90 tablet 0  . Potassium 99 MG TABS Take 99 mg by mouth daily.    . potassium chloride (K-DUR) 10 MEQ tablet Take 2 tablets (20 mEq total) by mouth 2 (two) times daily. 120 tablet 1  . temazepam (RESTORIL) 15 MG capsule Take 15 mg by mouth at bedtime as needed for sleep.    Marland Kitchen warfarin (COUMADIN) 5 MG tablet TAKE 1 TO 1 AND 1/2 TABLETS DAILY AS DIRECTED BY COUMADIN CLINIC 135 tablet 0  . warfarin (COUMADIN) 5 MG tablet Take 5-10 mg by mouth See admin instructions. Alternate taking 5 mg one day the 7.5 mg the other day Take 10 mg on 06/24/16    . oxyCODONE (OXY IR/ROXICODONE) 5 MG immediate release tablet Take 1 tablet (5 mg total) by mouth every 8 (eight) hours as needed for severe pain. 90 tablet 0   No facility-administered medications prior to visit.      Allergies:   Penicillins; Adhesive [tape]; and Bactrim [sulfamethoxazole-trimethoprim]   Social History   Social History  . Marital status: Married    Spouse name: N/A  . Number of children: 0  . Years of education: N/A   Occupational History  . Disabled Optician Unemployed   Social History Main Topics  . Smoking status: Light Tobacco Smoker    Types: Cigars  . Smokeless tobacco: Never Used     Comment: about 3 yearly- cigar  . Alcohol use 0.0 oz/week     Comment: social  . Drug use: No  . Sexual activity: Not Asked   Other Topics Concern  . None   Social History  Narrative  . None     Family History:  The patient's family history includes Cancer in his maternal aunt and  maternal uncle; Diabetes in his father; Heart disease in his mother; Hypertension in his other; Other in his mother; Stroke in his other; Throat cancer in his mother.   Review of Systems:   Please see the history of present illness.     General:  No chills, fever, night sweats or weight changes.  Cardiovascular:  No chest pain, palpitations, paroxysmal nocturnal dyspnea. Positive for dyspnea on exertion, edema, and orthopnea. Dermatological: No rash, lesions/masses Respiratory: No cough, Positive for dyspnea Urologic: No hematuria, dysuria Abdominal:   No nausea, vomiting, diarrhea, bright red blood per rectum, melena, or hematemesis Neurologic:  No visual changes, wkns, changes in mental status. All other systems reviewed and are otherwise negative except as noted above.   Physical Exam:    VS:  BP 129/67   Pulse 68   Ht 5\' 1"  (1.549 m)   Wt 258 lb (117 kg)   SpO2 97%   BMI 48.75 kg/m    General: Well developed, Obese Caucasian male appearing in no acute distress. Head: Normocephalic, atraumatic, sclera non-icteric, no xanthomas, nares are without discharge.  Neck: No carotid bruits. JVD at 9 cm. Lungs: Respirations regular and unlabored, decreased breath sounds at bases bilaterally Heart: Irregularly irregular. No S3 or S4. No rubs or gallops appreciated. Crisp valve sounds with a 2/6 SEM best appreciated at the apex. Abdomen: Soft, non-tender, non-distended with normoactive bowel sounds. No hepatomegaly. No rebound/guarding. No obvious abdominal masses. Pitting edema along abdomen and flank region.  Msk:  Strength and tone appear normal for age. No joint deformities or effusions. Extremities: No clubbing or cyanosis. Significant 2++ pitting edema up to thighs bilaterally.  Distal pedal pulses are 2+ bilaterally. Neuro: Alert and oriented X 3. Moves all extremities spontaneously. No focal deficits noted. Psych:  Responds to questions appropriately with a normal affect. Skin: No  rashes or lesions noted  Wt Readings from Last 3 Encounters:  08/27/16 258 lb (117 kg)  08/15/16 244 lb 3.2 oz (110.8 kg)  07/28/16 226 lb (102.5 kg)      Studies/Labs Reviewed:   EKG:  EKG is not ordered today.    Recent Labs: 10/09/2015: Magnesium 1.8 02/05/2016: ALT 19 06/25/2016: Hemoglobin 8.6; Platelets 168 07/14/2016: Brain Natriuretic Peptide 168.9; BUN 20; Creat 0.85; Potassium 4.5; Sodium 141   Lipid Panel    Component Value Date/Time   CHOL 118 08/18/2012 1137   TRIG 133.0 08/18/2012 1137   HDL 27.20 (L) 08/18/2012 1137   CHOLHDL 4 08/18/2012 1137   VLDL 26.6 08/18/2012 1137   LDLCALC 64 08/18/2012 1137    Additional studies/ records that were reviewed today include:   Echocardiogram: 12/2015 Study Conclusions  - Left ventricle: The cavity size was normal. There was severe   concentric hypertrophy. Systolic function was vigorous. The   estimated ejection fraction was in the range of 65% to 70%. Wall   motion was normal; there were no regional wall motion   abnormalities. The study is not technically sufficient to allow   evaluation of LV diastolic function. - Aortic valve: A mechanical prosthesis was present and functioning   normally. Peak velocity (S): 345 cm/s. Mean gradient (S): 28 mm   Hg. (Consistent with moderate aortic stenosis gradient) - Aorta: Post Bentall with ST Jude Mechanical Valve in 1988. Aortic   arch diameter: 40 mm. - Aortic arch: The aortic arch  was mildly dilated. - Mitral valve: A mechanical prosthesis was present and functioning   normally. - Left atrium: The atrium was severely dilated. - Right ventricle: The cavity size was mildly dilated. Wall   thickness was normal. - Right atrium: The atrium was moderately dilated. - Atrial septum: The septum bowed from left to right, consistent   with increased left atrial pressure. - Tricuspid valve: There was moderate regurgitation. - Pulmonary arteries: Systolic pressure was moderately  to severely   increased. PA peak pressure: 67 mm Hg (S).  Impressions:  - There was no evidence of a vegetation.   Assessment:    1. Acute on chronic diastolic CHF (congestive heart failure) ([place])   2. Chronic atrial fibrillation (HCC)   3. Venous insufficiency of both lower extremities   4. Coronary artery disease involving coronary bypass graft of native heart without angina pectoris   5. Severe mitral regurgitation   6. OSA (obstructive sleep apnea)      Plan:   In order of problems listed above:  1. Acute on Chronic Diastolic CHF - has experienced increasing weight gain from 226 lbs on 11/27 to 258 lbs at today's office visit. He is significantly volume overloaded by physical exam and is experiencing worsening dyspnea with exertion and orthopnea.  - he has been noncompliant with increased PO Lasix dosing and with his presenting symptoms and him being 32 lbs up from his dry weight, he will require admission and IV diuresis. This was discussed with Dr. [name] who is in agreement with this assessment and plan.  - will start IV Lasix 40mg  BID which will need to be further adjusted pending his response. Obtain strict I&O's and daily weights. Will obtain a BMET and BNP upon arrival to the hospital.   2. Chronic Atrial Fibrillation - This patients CHA2DS2-VASc Score and unadjusted Ischemic Stroke Rate (% per year) is equal to 3.2 % stroke rate/year from a score of 3 (CHF, HTN, Vascular). Continue Coumadin for anticoagulation.  - HR well-controlled. Continue BB.   3. Venous Insufficiency of Both Extremities - followed by Vascular Clinic at [facility]. Has been instructed to use his compression machine for 6+ hours daily to assist with his edema, but uses this intermittently. Compliance with this regimen strongly encouraged. Was instructed on the importance of elevation of his lower extremities while admitted.   4. CAD without Angina Pectoris - s/p SVG-RCA, known to be occluded by cath  in 12/2014.  - denies any recent anginal symptoms.   5. Severe MR - s/p St Jude mechanical valve placed in 12/2014. - continue Coumadin per Pharmacy dosing during admission.   6. OSA -continue CPAP. Will order during admission.    Medication Adjustments/Labs and Tests Ordered: Current medicines are reviewed at length with the patient today.  Concerns regarding medicines are outlined above.  Medication changes, Labs and Tests ordered today are listed in the Patient Instructions below.  Patient Instructions  You may go home and get any items you need.  You will need to go on to [facility] thru the admitting department You will have a bed on [address] [place], [place]  08/27/2016 9:57 AM    [address], [place], [place]  [address] Phone: [phone]; Fax: [phone]  [address] [place], [place] [address] Phone: [phone]

## 2016-08-28 ENCOUNTER — Telehealth: Payer: Self-pay | Admitting: Internal Medicine

## 2016-08-28 DIAGNOSIS — I5032 Chronic diastolic (congestive) heart failure: Secondary | ICD-10-CM

## 2016-08-28 DIAGNOSIS — I482 Chronic atrial fibrillation: Secondary | ICD-10-CM

## 2016-08-28 DIAGNOSIS — G4733 Obstructive sleep apnea (adult) (pediatric): Secondary | ICD-10-CM

## 2016-08-28 DIAGNOSIS — I10 Essential (primary) hypertension: Secondary | ICD-10-CM

## 2016-08-28 LAB — BASIC METABOLIC PANEL
ANION GAP: 8 (ref 5–15)
BUN: 18 mg/dL (ref 6–20)
CALCIUM: 8 mg/dL — AB (ref 8.9–10.3)
CO2: 28 mmol/L (ref 22–32)
Chloride: 100 mmol/L — ABNORMAL LOW (ref 101–111)
Creatinine, Ser: 0.88 mg/dL (ref 0.61–1.24)
GFR calc non Af Amer: >60 mL/min (ref >60)
Glucose, Bld: 78 mg/dL (ref 65–99)
POTASSIUM: 4.2 mmol/L (ref 3.5–5.1)
Sodium: 136 mmol/L (ref 135–145)

## 2016-08-28 LAB — CBC
HEMATOCRIT: 34.8 % — AB (ref 39.0–52.0)
Hemoglobin: 9.5 g/dL — ABNORMAL LOW (ref 13.0–17.0)
MCH: 17.9 pg — ABNORMAL LOW (ref 26.0–34.0)
MCHC: 27.3 g/dL — ABNORMAL LOW (ref 30.0–36.0)
MCV: 65.4 fL — ABNORMAL LOW (ref 78.0–100.0)
PLATELETS: 164 10*3/uL (ref 150–400)
RBC: 5.32 MIL/uL (ref 4.22–5.81)
RDW: 19.5 % — AB (ref 11.5–15.5)
WBC: 9.9 10*3/uL (ref 4.0–10.5)

## 2016-08-28 LAB — PROTIME-INR
INR: 2.42
Prothrombin Time: 26.8 seconds — ABNORMAL HIGH (ref 11.4–15.2)

## 2016-08-28 MED ORDER — POTASSIUM CHLORIDE CRYS ER 20 MEQ PO TBCR
20.0000 meq | EXTENDED_RELEASE_TABLET | Freq: Two times a day (BID) | ORAL | Status: DC
  Administered 2016-08-28 – 2016-08-30 (×4): 20 meq via ORAL
  Filled 2016-08-28 (×3): qty 1

## 2016-08-28 MED ORDER — FUROSEMIDE 10 MG/ML IJ SOLN
80.0000 mg | Freq: Two times a day (BID) | INTRAMUSCULAR | Status: DC
  Administered 2016-08-29 – 2016-08-30 (×3): 80 mg via INTRAVENOUS
  Filled 2016-08-28 (×3): qty 8

## 2016-08-28 MED ORDER — WARFARIN SODIUM 7.5 MG PO TABS
7.5000 mg | ORAL_TABLET | Freq: Once | ORAL | Status: AC
  Administered 2016-08-28: 7.5 mg via ORAL
  Filled 2016-08-28: qty 1

## 2016-08-28 NOTE — Progress Notes (Signed)
   08/28/16 2308  BiPAP/CPAP/SIPAP  BiPAP/CPAP/SIPAP Pt Type Adult  Mask Type Nasal mask  Mask Size Medium  Respiratory Rate 16 breaths/min  IPAP 11 cmH20  EPAP 11 cmH2O  Oxygen Percent 30 %  Flow Rate 4 lpm  BiPAP/CPAP/SIPAP CPAP  Patient Home Equipment No  Auto Titrate No

## 2016-08-28 NOTE — Progress Notes (Signed)
Wedge pillow that was ordered is not available per materials in hospital. Will keep pillows under patient's legs.

## 2016-08-28 NOTE — Progress Notes (Signed)
Patient Name: Lance Cook Date of Encounter: 08/28/2016  Primary Cardiologist: Dr. [facility] Problem List     Principal Problem:   Acute on chronic diastolic CHF (congestive heart failure) ([place]) Active Problems:   Essential hypertension   Atrial fibrillation, chronic   Long term current use of anticoagulant therapy    Subjective   Breathing mildly improved. Reports good urination since admission. No chest discomfort or palpitations.   Inpatient Medications    Scheduled Meds: . allopurinol  300 mg Oral Daily  . atenolol  50 mg Oral BID  . digoxin  250 mcg Oral Daily  . furosemide  40 mg Intravenous BID  . magnesium oxide  400 mg Oral BID  . methocarbamol  500 mg Oral TID  . potassium chloride SA  20 mEq Oral BID  . sodium chloride flush  3 mL Intravenous Q12H  . Warfarin - Pharmacist Dosing Inpatient   Does not apply q1800   Continuous Infusions:  PRN Meds: sodium chloride, acetaminophen, ondansetron (ZOFRAN) IV, oxyCODONE, sodium chloride flush, temazepam   Vital Signs    Vitals:   08/27/16 2159 08/28/16 0027 08/28/16 0457 08/28/16 0815  BP:  114/62 120/65 140/78  Pulse: 68 61 65 80  Resp: 16 18 18 18   Temp:  97.5 F (36.4 C) 97.9 F (36.6 C) 97.8 F (36.6 C)  TempSrc:  Oral Oral Oral  SpO2: 95% 95% 99% 94%  Weight:   254 lb 6.6 oz (115.4 kg)   Height:        Intake/Output Summary (Last 24 hours) at 08/28/16 0926 Last data filed at 08/28/16 0459  Gross per 24 hour  Intake              720 ml  Output             1100 ml  Net             -380 ml   Filed Weights   08/27/16 1300 08/28/16 0457  Weight: 248 lb 3.2 oz (112.6 kg) 254 lb 6.6 oz (115.4 kg)    Physical Exam    GEN: Obese Caucasian male appearing in no acute distress. CPAP in place.   HEENT: Grossly normal.  Neck: Supple, no carotid bruits, or masses. JVD remains at 9cm Cardiac: Irregularly irregular, no rubs, or gallops. 2/6 SEM at apex. Crisp valve sounds present. No clubbing.  2+ pitting edema up to thighs bilaterally. Radials/DP/PT 2+ and equal bilaterally.  Respiratory:  Respirations regular and unlabored, clear to auscultation bilaterally. GI: Soft, nontender, nondistended, BS + x 4. MS: no deformity or atrophy. Skin: warm and dry, no rash. Neuro:  Strength and sensation are intact. Psych: AAOx3.  Normal affect.  Labs    CBC  Recent Labs  08/28/16 0317  WBC 9.9  HGB 9.5*  HCT 34.8*  MCV 65.4*  PLT [id number]   Basic Metabolic Panel  Recent Labs  08/27/16 1409 08/28/16 0317  NA 138 136  K 3.9 4.2  CL 102 100*  CO2 31 28  GLUCOSE 68 78  BUN 18 18  CREATININE 0.93 0.88  CALCIUM 8.1* 8.0*     Telemetry    Atrial fibrillation, HR in 60's - 70's. - Personally Reviewed  ECG   No new tracings.   Radiology    No results found.  Cardiac Studies   Echocardiogram: 12/2015 Study Conclusions  - Left ventricle: The cavity size was normal. There was severe concentric hypertrophy. Systolic function was vigorous. The  estimated ejection fraction was in the range of 65% to 70%. Wall motion was normal; there were no regional wall motion abnormalities. The study is not technically sufficient to allow evaluation of LV diastolic function. - Aortic valve: A mechanical prosthesis was present and functioning normally. Peak velocity (S): 345 cm/s. Mean gradient (S): 28 mm Hg. (Consistent with moderate aortic stenosis gradient) - Aorta: Post Bentall with ST Jude Mechanical Valve in 1988. Aortic arch diameter: 40 mm. - Aortic arch: The aortic arch was mildly dilated. - Mitral valve: A mechanical prosthesis was present and functioning normally. - Left atrium: The atrium was severely dilated. - Right ventricle: The cavity size was mildly dilated. Wall thickness was normal. - Right atrium: The atrium was moderately dilated. - Atrial septum: The septum bowed from left to right, consistent with increased left atrial pressure. -  Tricuspid valve: There was moderate regurgitation. - Pulmonary arteries: Systolic pressure was moderately to severely increased. PA peak pressure: 67 mm Hg (S).  Impressions:  - There was no evidence of a vegetation.  Patient Profile     55 y.o. male w/ PMH of AVR (s/p Bentall procedure in 1988), severe mitral regurgitation (s/p St Jude mechanical valve placed in 12/2014), CAD (s/p SVG-RCA, known to be occluded by cath in 12/2014), chronic atrial fibrillation, chronic diastolic CHF, prior CVA, chronic venous insufficiency, OSA, OSA (on CPAP) and recurrent C. difficile infection who was directly admitted from the office on 12/27 for acute on chronic diastolic CHF with weight up 32 lbs from baseline.   Assessment & Plan    1. Acute on Chronic Diastolic CHF - seen in the office on 12/27 and noted to be significantly volume overloaded by physical exam with complaints of worsening dyspnea with exertion, orthopnea, and generalized edema. Weight up 33 lbs from baseline (226 --> 258 lbs) as he reported being  noncompliant with increased PO Lasix dosing.  - admitted and started on IV Lasix 40mg  BID. Recorded output of -1.0L thus far (doubt accuracy of this as he reports significant output and with recorded weight down from 258 lbs to 254 lbs). Creatinine stable at 0.88. Continue with IV diuresis.   2. Chronic Atrial Fibrillation - This patients CHA2DS2-VASc Score and unadjusted Ischemic Stroke Rate (% per year) is equal to 3.2 % stroke rate/year from a score of 3 (CHF, HTN, Vascular). Continue Coumadin for anticoagulation. Appreciate Pharmacy's assistance with dosing.  - HR well-controlled. Continue BB.   3. Venous Insufficiency of Both Extremities - followed by Vascular Clinic at [facility]. Has been instructed to use his compression machine for 6+ hours daily to assist with his edema, but uses this intermittently. Compliance with this regimen strongly encouraged.   4. CAD without Angina Pectoris -  s/p SVG-RCA, known to be occluded by cath in 12/2014.  - denies any recent anginal symptoms.   5. Severe MR - s/p St Jude mechanical valve placed in 12/2014. - continue Coumadin per Pharmacy dosing during admission.   6. OSA -continue CPAP.   Signed, [place], PA  08/28/2016, 9:26 AM  The patient has been seen in conjunction with [place], PA-C. All aspects of care have been considered and discussed. The patient has been personally interviewed, examined, and all clinical data has been reviewed.   Still with evidence of significant volume overload.  More aggressive diuresis, increasing furosemide to 80 mg every 12 hours.  Follow blood pressure and kidney function closely

## 2016-08-28 NOTE — Progress Notes (Signed)
ANTICOAGULATION CONSULT NOTE - Initial Consult  Pharmacy Consult for Warfarin Indication: atrial fibrillation and mechanical mitral valve  Assessment: 55 yoM with CHF admitted 12/27 for worsening dyspnea, orthopnea, and edema after an office visit. Patient has a Probation officer mitral valve placed in 12/2014. Prior to admission, patient was anticoagulated with warfarin. Last outpatient INR visit was 12/19 - patient was on 5mg  MWF, 7.5mg  TuThSaSu with an INR of 2.9 (therapeutic).  -INR increased to 2.42 today, still subtherapeutic -CBC stable, no s/s of bleeding  Goal of Therapy:  INR 2.5 - 3.5 Monitor platelets by anticoagulation protocol: Yes   Plan:  Warfarin 7.5mg  x 1 dose tonight Monitor daily INR and CBC as needed Monitor s/sx's of bleeding   Allergies  Allergen Reactions  . Penicillins Other (See Comments)    REACTION: Hives Has patient had a PCN reaction causing immediate rash, facial/tongue/throat swelling, SOB or lightheadedness with hypotension: unsure Has patient had a PCN reaction causing severe rash involving mucus membranes or skin necrosis: unsure Has patient had a PCN reaction that required hospitalization unsure Has patient had a PCN reaction occurring within the last 10 years: yes If all of the above answers are "NO", then may proceed with Cephalosporin use.  . Adhesive [Tape] Other (See Comments)    Shin irritation  . Bactrim [Sulfamethoxazole-Trimethoprim] Other (See Comments)    CDIF    Patient Measurements: Height: 5\' 1"  (154.9 cm) Weight: 254 lb 6.6 oz (115.4 kg) IBW/kg (Calculated) : 52.3  Vital Signs: Temp: 97.8 F (36.6 C) (12/28 0815) Temp Source: Oral (12/28 0815) BP: 140/78 (12/28 0815) Pulse Rate: 80 (12/28 0815)  Labs:  Recent Labs  08/27/16 1409 08/28/16 0317  HGB  --  9.5*  HCT  --  34.8*  PLT  --  164  LABPROT 24.1* 26.8*  INR 2.12 2.42  CREATININE 0.93 0.88    Estimated Creatinine Clearance: 104 mL/min (by C-G formula  based on SCr of 0.88 mg/dL).   Medical History: Past Medical History:  Diagnosis Date  . Acute respiratory failure with hypoxemia ([place])   . Anemia   . Anxiety   . Aortic aneurysm ([place])   . Aortic aneurysm and dissection ([place])   . ARDS (adult respiratory distress syndrome) ([place]) 01/27/2015  . Arthritis   . Ascending aortic dissection ([place]) 07/14/2008   Localized dissection of ascending aorta noted on CTA in 2009 and stable on CTA in 2011  . Asymptomatic chronic venous hypertension 01/15/2010   Overview:  Overview:  Qualifier: Diagnosis of  By: [name] MD, [name]   Last Assessment & Plan:  I suggested he go to a vein clinic to see if he could be qualified compression hose of some type, since he is not a surgical candidate according to the vascular surgeons.   . Atrial fibrillation ([place])    chronic persistent  . Bell's palsy   . CAD (coronary artery disease)    Old scar inferior wall myoview, 10/2009 EF 52%.  He did have previous SVG to RCA but no obstructive disease noted on his most recent cath.  SVG occluded.   . Cellulitis 04/02/2014  . Cerebral artery occlusion with cerebral infarction ([place]) 10/08/2011   Overview:  Overview:  And hx of TIA prior to CABG, all thought due to systemic emboli prior to coumadin   . CHF (congestive heart failure) ([place])   . Chronic diastolic congestive heart failure ([place])   . Chronic diastolic heart failure ([place]) 03/01/2015  . Chronic LBP 10/08/2011  .  CVA (cerebral vascular accident) ([place]) 10/08/2011   And hx of TIA prior to CABG, all thought due to systemic emboli prior to coumadin   . Diabetes mellitus    diet controlled  . DIABETES MELLITUS, TYPE II 11/01/2009   Qualifier: Diagnosis of  By: [name] MD, [name]    . Diverticulosis   . Dysrhythmia    Dr. [name] follows-LOV 05-08-16 Epic.  . ED (erectile dysfunction) of organic origin 10/08/2011   Overview:  Last Assessment & Plan:  S/p unilateral orchiectomy, for testosterone level, consider androgel pump  1.62    . Encephalopathy acute 02/05/2015  . Erectile dysfunction 10/08/2011  . GERD (gastroesophageal reflux disease)    not needing medication at thhis time- 01/22/15  . Gout   . H/O mechanical aortic valve replacement 11/01/2009   Qualifier: Diagnosis of  By: [name], [name]    . Hiatal hernia   . History of adenomatous polyp of colon 12/23/2011   Colonoscopy January 2010, 6 mm rectal tubulovillous adenoma. No high-grade dysplasia   . History of colon polyps 12/23/2011   Overview:  Overview:  Colonoscopy January 2010, 6 mm rectal tubulovillous adenoma. No high-grade dysplasia   . Hyperlipidemia   . Hypertension   . Hypogonadism male 04/08/2012  . Impaired glucose tolerance 10/08/2011  . Leg pain 06/28/2010  . Morbid obesity ([place]) 04/02/2014  . Myocardial infarction age 35  . Obesity   . OSA (obstructive sleep apnea)    CPAP  . Peripheral vascular disease ([place])   . Reflux 11/06/09  . 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 [id number]   33 mm St Jude bileaflet mechanical valve placed via right mini thoracotomy approach  . 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 Severe mitral regurgitation 10/11/2014  . Shortness of breath dyspnea    with exertion  . Stroke [facility])    TIA and Stroke no residual effect- 1st was 23  . Testicular cancer ([place])   . TIA (transient ischemic attack)    age 28  . Varicose veins     Medications:  Scheduled:  . allopurinol  300 mg Oral Daily  . atenolol  50 mg Oral BID  . digoxin  250 mcg Oral Daily  . furosemide  40 mg Intravenous BID  . magnesium oxide  400 mg Oral BID  . methocarbamol  500 mg Oral TID  . potassium chloride SA  20 mEq Oral BID  . sodium chloride flush  3 mL Intravenous Q12H  . warfarin  7.5 mg Oral ONCE-1800  . Warfarin - Pharmacist Dosing Inpatient   Does not apply [place] Grayland Ormond), PharmD  [id number]  Pharmacy Resident Pager: [phone] 08/28/2016 9:31 AM

## 2016-08-28 NOTE — Telephone Encounter (Signed)
Patient currently admitted with CHF exacerbation to [facility].  I have cancelled the procedure for 09/02/16.  He will call back to reschedule

## 2016-08-29 DIAGNOSIS — Z7901 Long term (current) use of anticoagulants: Secondary | ICD-10-CM

## 2016-08-29 LAB — BASIC METABOLIC PANEL
Anion gap: 4 — ABNORMAL LOW (ref 5–15)
BUN: 17 mg/dL (ref 6–20)
CO2: 32 mmol/L (ref 22–32)
CREATININE: 0.93 mg/dL (ref 0.61–1.24)
Calcium: 7.8 mg/dL — ABNORMAL LOW (ref 8.9–10.3)
Chloride: 101 mmol/L (ref 101–111)
GFR calc Af Amer: >60 mL/min (ref >60)
Glucose, Bld: 65 mg/dL (ref 65–99)
Potassium: 4 mmol/L (ref 3.5–5.1)
SODIUM: 137 mmol/L (ref 135–145)

## 2016-08-29 LAB — PROTIME-INR
INR: 2.9
Prothrombin Time: 31 s — ABNORMAL HIGH (ref 11.4–15.2)

## 2016-08-29 MED ORDER — WARFARIN SODIUM 5 MG PO TABS
5.0000 mg | ORAL_TABLET | Freq: Once | ORAL | Status: AC
  Administered 2016-08-29: 5 mg via ORAL
  Filled 2016-08-29: qty 1
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
## 2016-08-29 NOTE — Progress Notes (Signed)
ANTICOAGULATION CONSULT NOTE - Initial Consult  Pharmacy Consult for Warfarin Indication: atrial fibrillation and mechanical mitral valve  Assessment: 54 yoM with CHF admitted 12/27 for worsening dyspnea, orthopnea, and edema after an office visit. Patient has a Probation officer mitral valve placed in 12/2014. Prior to admission, patient was anticoagulated with warfarin. Last outpatient INR visit was 12/19 - patient was on 5mg  MWF, 7.5mg  TuThSaSu with an INR of 2.9 (therapeutic).  -INR increased to 2.90 today and is now therapeutic -CBC stable, no s/s of bleeding  Goal of Therapy:  INR 2.5 - 3.5 Monitor platelets by anticoagulation protocol: Yes   Plan:  Warfarin 5mg  x 1 dose tonight Monitor daily INR and CBC as needed Monitor s/sx's of bleeding   Allergies  Allergen Reactions  . Penicillins Other (See Comments)    REACTION: Hives Has patient had a PCN reaction causing immediate rash, facial/tongue/throat swelling, SOB or lightheadedness with hypotension: unsure Has patient had a PCN reaction causing severe rash involving mucus membranes or skin necrosis: unsure Has patient had a PCN reaction that required hospitalization unsure Has patient had a PCN reaction occurring within the last 10 years: yes If all of the above answers are "NO", then may proceed with Cephalosporin use.  . Adhesive [Tape] Other (See Comments)    Shin irritation  . Bactrim [Sulfamethoxazole-Trimethoprim] Other (See Comments)    CDIF    Patient Measurements: Height: 5\' 1"  (154.9 cm) Weight: 250 lb (113.4 kg) (scale a) IBW/kg (Calculated) : 52.3  Vital Signs: Temp: 97.5 F (36.4 C) (12/29 0551) Temp Source: Oral (12/29 0551) BP: 111/54 (12/29 0551) Pulse Rate: 76 (12/29 0551)  Labs:  Recent Labs  08/27/16 1409 08/28/16 0317 08/29/16 0354  HGB  --  9.5*  --   HCT  --  34.8*  --   PLT  --  164  --   LABPROT 24.1* 26.8* 31.0*  INR 2.12 2.42 2.90  CREATININE 0.93 0.88 0.93    Estimated  Creatinine Clearance: 97.4 mL/min (by C-G formula based on SCr of 0.93 mg/dL).   Medical History: Past Medical History:  Diagnosis Date  . Acute respiratory failure with hypoxemia ([place])   . Anemia   . Anxiety   . Aortic aneurysm ([place])   . Aortic aneurysm and dissection ([place])   . ARDS (adult respiratory distress syndrome) ([place]) 01/27/2015  . Arthritis   . Ascending aortic dissection ([place]) 07/14/2008   Localized dissection of ascending aorta noted on CTA in 2009 and stable on CTA in 2011  . Asymptomatic chronic venous hypertension 01/15/2010   Overview:  Overview:  Qualifier: Diagnosis of  By: [name] MD, [name]   Last Assessment & Plan:  I suggested he go to a vein clinic to see if he could be qualified compression hose of some type, since he is not a surgical candidate according to the vascular surgeons.   . Atrial fibrillation ([place])    chronic persistent  . Bell's palsy   . CAD (coronary artery disease)    Old scar inferior wall myoview, 10/2009 EF 52%.  He did have previous SVG to RCA but no obstructive disease noted on his most recent cath.  SVG occluded.   . Cellulitis 04/02/2014  . Cerebral artery occlusion with cerebral infarction ([place]) 10/08/2011   Overview:  Overview:  And hx of TIA prior to CABG, all thought due to systemic emboli prior to coumadin   . CHF (congestive heart failure) ([place])   . Chronic diastolic congestive heart failure (  [place])   . Chronic diastolic heart failure ([place]) 03/01/2015  . Chronic LBP 10/08/2011  . CVA (cerebral vascular accident) ([place]) 10/08/2011   And hx of TIA prior to CABG, all thought due to systemic emboli prior to coumadin   . Diabetes mellitus    diet controlled  . DIABETES MELLITUS, TYPE II 11/01/2009   Qualifier: Diagnosis of  By: [name] MD, [name]    . Diverticulosis   . Dysrhythmia    Dr. [name] follows-LOV 05-08-16 Epic.  . ED (erectile dysfunction) of organic origin 10/08/2011   Overview:  Last Assessment & Plan:  S/p unilateral  orchiectomy, for testosterone level, consider androgel pump 1.62    . Encephalopathy acute 02/05/2015  . Erectile dysfunction 10/08/2011  . GERD (gastroesophageal reflux disease)    not needing medication at thhis time- 01/22/15  . Gout   . H/O mechanical aortic valve replacement 11/01/2009   Qualifier: Diagnosis of  By: [name], [name]    . Hiatal hernia   . History of adenomatous polyp of colon 12/23/2011   Colonoscopy January 2010, 6 mm rectal tubulovillous adenoma. No high-grade dysplasia   . History of colon polyps 12/23/2011   Overview:  Overview:  Colonoscopy January 2010, 6 mm rectal tubulovillous adenoma. No high-grade dysplasia   . Hyperlipidemia   . Hypertension   . Hypogonadism male 04/08/2012  . Impaired glucose tolerance 10/08/2011  . Leg pain 06/28/2010  . Morbid obesity ([place]) 04/02/2014  . Myocardial infarction age 48  . Obesity   . OSA (obstructive sleep apnea)    CPAP  . Peripheral vascular disease ([place])   . Reflux 11/06/09  . 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 [id number]   33 mm St Jude bileaflet mechanical valve placed via right mini thoracotomy approach  . 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 Severe mitral regurgitation 10/11/2014  . Shortness of breath dyspnea    with exertion  . Stroke [facility])    TIA and Stroke no residual effect- 1st was 63  . Testicular cancer ([place])   . TIA (transient ischemic attack)    age 44  . Varicose veins     Medications:  Scheduled:  . allopurinol  300 mg Oral Daily  . atenolol  50 mg Oral BID  . digoxin  250 mcg Oral Daily  . furosemide  80 mg Intravenous BID  . magnesium oxide  400 mg Oral BID  . methocarbamol  500 mg Oral TID  . potassium chloride SA  20 mEq Oral BID  . sodium chloride flush  3 mL Intravenous Q12H  . warfarin  5 mg Oral ONCE-1800  . Warfarin - Pharmacist Dosing Inpatient   Does  not apply [place] Grayland Ormond), PharmD  PGY1 Pharmacy Resident Pager: [phone] 08/29/2016 8:57 AM

## 2016-08-29 NOTE — Progress Notes (Signed)
Patient Name: Lance Cook Date of Encounter: 08/29/2016  Primary Cardiologist: Dr. 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 No chest pain.   Inpatient Medications    Scheduled Meds: . allopurinol  300 mg Oral Daily  . atenolol  50 mg Oral BID  . digoxin  250 mcg Oral Daily  . furosemide  80 mg Intravenous BID  . magnesium oxide  400 mg Oral BID  . methocarbamol  500 mg Oral TID  . potassium chloride SA  20 mEq Oral BID  . sodium chloride flush  3 mL Intravenous Q12H  . Warfarin - Pharmacist Dosing Inpatient   Does not apply q1800   Continuous Infusions:  PRN Meds: sodium chloride, acetaminophen, ondansetron (ZOFRAN) IV, oxyCODONE, sodium chloride flush, temazepam   Vital Signs    Vitals:   08/28/16 0815 08/28/16 1227 08/28/16 1943 08/29/16 0551  BP: 140/78 (!) 126/54 (!) 115/51 (!) 111/54  Pulse: 80 94 68 76  Resp: 18 18 18 18   Temp: 97.8 F (36.6 C) 98.1 F (36.7 C) 98.2 F (36.8 C) 97.5 F (36.4 C)  TempSrc: Oral Oral Oral Oral  SpO2: 94% 94% 100% 100%  Weight:    250 lb (113.4 kg)  Height:        Intake/Output Summary (Last 24 hours) at 08/29/16 0840 Last data filed at 08/29/16 [id number]  Gross per 24 hour  Intake             1083 ml  Output             2025 ml  Net             -942 ml   Filed Weights   08/27/16 1300 08/28/16 0457 08/29/16 0551  Weight: 248 lb 3.2 oz (112.6 kg) 254 lb 6.6 oz (115.4 kg) 250 lb (113.4 kg)    Physical Exam    GEN: Obese Caucasian male appearing  in no acute distress. CPAP in place.   HEENT: Grossly normal.  Neck: Supple, no carotid bruits, or masses. JVD remains at 9cm Cardiac: Irregularly irregular, no rubs, or gallops. 2/6 SEM at apex. Crisp valve sounds present. No clubbing. 2+ pitting edema up to thighs bilaterally. Radials/DP/PT 2+ and equal bilaterally.  Respiratory:  Respirations regular and unlabored, clear to auscultation bilaterally. GI: Soft, nontender, nondistended, BS + x 4. MS: no deformity or atrophy. Skin: warm and dry, no rash. Neuro:  Strength and sensation are intact. Psych: AAOx3.  Normal affect.  Labs    CBC  Recent Labs  08/28/16 0317  WBC 9.9  HGB 9.5*  HCT 34.8*  MCV 65.4*  PLT 123456   Basic Metabolic Panel  Recent Labs  08/28/16 0317 08/29/16 0354  NA 136 137  K 4.2 4.0  CL 100* 101  CO2 28 32  GLUCOSE 78 65  BUN 18 17  CREATININE 0.88 0.93  CALCIUM 8.0* 7.8*     Telemetry    Atrial fibrillation, HR in 60's. - Personally Reviewed  ECG   No  new tracings.   Radiology    No results found.  Cardiac Studies   Echocardiogram: 12/2015 Study Conclusions  - Left ventricle: The cavity size was normal. There was severe concentric hypertrophy. Systolic function was vigorous. The estimated ejection fraction was in the range of 65% to 70%. Wall motion was normal; there were no regional wall motion abnormalities. The study is not technically sufficient to allow evaluation of LV diastolic function. - Aortic valve: A mechanical prosthesis was present and functioning normally. Peak velocity (S): 345 cm/s. Mean gradient (S): 28 mm Hg. (Consistent with moderate aortic stenosis gradient) - Aorta: Post Bentall with ST Jude Mechanical Valve in 1988. Aortic arch diameter: 40 mm. - Aortic arch: The aortic arch was mildly dilated. - Mitral valve: A mechanical prosthesis was present and functioning normally. - Left atrium: The atrium was severely dilated. - Right  ventricle: The cavity size was mildly dilated. Wall thickness was normal. - Right atrium: The atrium was moderately dilated. - Atrial septum: The septum bowed from left to right, consistent with increased left atrial pressure. - Tricuspid valve: There was moderate regurgitation. - Pulmonary arteries: Systolic pressure was moderately to severely increased. PA peak pressure: 67 mm Hg (S).  Impressions:  - There was no evidence of a vegetation.  Patient Profile     55 y.o. male w/ PMH of AVR (s/p Bentall procedure in 1988), severe mitral regurgitation (s/p St Jude mechanical valve placed in 12/2014), CAD (s/p SVG-RCA, known to be occluded by cath in 12/2014), chronic atrial fibrillation, chronic diastolic CHF, prior CVA, chronic venous insufficiency, OSA, OSA (on CPAP) and recurrent C. difficile infection who was directly admitted from the office on 12/27 for acute on chronic diastolic CHF with weight up 32 lbs from baseline.   Assessment & Plan    1. Acute on Chronic Diastolic CHF - seen in the office on 12/27 and noted to be significantly volume overloaded by physical exam with complaints of worsening dyspnea with exertion, orthopnea, and generalized edema. Weight up 33 lbs from baseline (226 --> 258 lbs) as he reported being noncompliant with increased PO Lasix dosing.  - on IV Lasix 80mg  BID. Recorded output of 2L UOP yesterday, weight down from 258 lbs to 250bs. Creatinine stable at 0.93. Continue with IV diuresis.   2. Chronic Atrial Fibrillation - This patients CHA2DS2-VASc Score and unadjusted Ischemic Stroke Rate (% per year) is equal to 3.2 % stroke rate/year from a score of 3 (CHF, HTN, Vascular). Continue Coumadin for anticoagulation. Appreciate Pharmacy's assistance with dosing.  - HR well-controlled. Continue BB.   3. Venous Insufficiency of Both Extremities - followed by Vascular Clinic at [facility]. Has been instructed to use his compression machine for 6+ hours daily to  assist with his edema, but uses this intermittently. Compliance with this regimen strongly encouraged.   4. CAD without Angina Pectoris - s/p SVG-RCA, known to be occluded by cath in 12/2014.  - denies any recent anginal symptoms.   5. Severe MR - s/p St Jude mechanical valve placed in 12/2014. - continue Coumadin per Pharmacy dosing during admission.   6. OSA -continue CPAP.   Signed, Reino Bellis, NP  08/29/2016, 8:40 AM   The patient has been seen in conjunction with Reino Bellis, NP. All aspects of care have been considered and discussed. The patient has been personally interviewed, examined, and all clinical data has been reviewed.   Improve diuresis with 80 mg of furosemide every 12 hours. We will be slightly more  aggressive given stable kidney function. Increase to every 8 hours. Monitor kidney function closely.

## 2016-08-29 NOTE — Progress Notes (Signed)
Pt resting with wife at bedside. Upon entering the room, pt was on the phone with a doctors office requesting pager number for office representative on-site, to page for increase in frequency for robaxin/oxycontin. Pt discussed this request with his doctor at the bedside earlier today, this nurse discussed the request with hospitalist this morning.  No changed orders noted in the computer at this time.

## 2016-08-30 LAB — BASIC METABOLIC PANEL
Anion gap: 4 — ABNORMAL LOW (ref 5–15)
BUN: 18 mg/dL (ref 6–20)
CALCIUM: 8 mg/dL — AB (ref 8.9–10.3)
CO2: 37 mmol/L — ABNORMAL HIGH (ref 22–32)
Chloride: 97 mmol/L — ABNORMAL LOW (ref 101–111)
Creatinine, Ser: 1 mg/dL (ref 0.61–1.24)
GFR calc Af Amer: >60 mL/min (ref >60)
GLUCOSE: 80 mg/dL (ref 65–99)
POTASSIUM: 3.7 mmol/L (ref 3.5–5.1)
Sodium: 138 mmol/L (ref 135–145)

## 2016-08-30 LAB — PROTIME-INR
INR: 2.77
Prothrombin Time: 29.8 seconds — ABNORMAL HIGH (ref 11.4–15.2)

## 2016-08-30 MED ORDER — POTASSIUM CHLORIDE CRYS ER 20 MEQ PO TBCR
20.0000 meq | EXTENDED_RELEASE_TABLET | Freq: Three times a day (TID) | ORAL | Status: DC
  Administered 2016-08-30 – 2016-09-03 (×12): 20 meq via ORAL
  Filled 2016-08-30 (×12): qty 1

## 2016-08-30 MED ORDER — OXYCODONE HCL 5 MG PO TABS
5.0000 mg | ORAL_TABLET | Freq: Three times a day (TID) | ORAL | Status: DC | PRN
  Administered 2016-08-30 – 2016-09-03 (×12): 5 mg via ORAL
  Filled 2016-08-30 (×12): qty 1

## 2016-08-30 MED ORDER — WARFARIN SODIUM 7.5 MG PO TABS
7.5000 mg | ORAL_TABLET | Freq: Once | ORAL | Status: AC
  Administered 2016-08-30: 7.5 mg via ORAL
  Filled 2016-08-30: qty 1

## 2016-08-30 MED ORDER — METHOCARBAMOL 500 MG PO TABS
500.0000 mg | ORAL_TABLET | Freq: Three times a day (TID) | ORAL | Status: DC | PRN
  Administered 2016-08-30 – 2016-09-03 (×11): 500 mg via ORAL
  Filled 2016-08-30 (×10): qty 1

## 2016-08-30 MED ORDER — SODIUM CHLORIDE 0.9 % IV SOLN
INTRAVENOUS | Status: DC
  Administered 2016-08-30: 21:00:00 via INTRAVENOUS

## 2016-08-30 MED ORDER — FUROSEMIDE 10 MG/ML IJ SOLN
80.0000 mg | Freq: Three times a day (TID) | INTRAMUSCULAR | Status: DC
  Administered 2016-08-30 – 2016-09-02 (×10): 80 mg via INTRAVENOUS
  Filled 2016-08-30 (×12): qty 8

## 2016-08-30 MED ORDER — METHOCARBAMOL 500 MG PO TABS: 500.0000 mg | ORAL_TABLET | Freq: Three times a day (TID) | ORAL | Status: DC | PRN

## 2016-08-30 NOTE — Progress Notes (Signed)
   Subjective: Breatihg is better  NO CP   Objective: Vitals:   08/29/16 0551 08/29/16 1200 08/29/16 2245 08/30/16 0449  BP: (!) 111/54 108/63 (!) 93/47 (!) 112/58  Pulse: 76 72 68 62  Resp: 18 18 18 18   Temp: 97.5 F (36.4 C) 98.3 F (36.8 C) 98.2 F (36.8 C) 97.6 F (36.4 C)  TempSrc: Oral Oral Oral Oral  SpO2: 100% 100% 100% 100%  Weight: 250 lb (113.4 kg)   244 lb 14.4 oz (111.1 kg)  Height:       Weight change: -5 lb 1.6 oz (-2.313 kg)  Intake/Output Summary (Last 24 hours) at 08/30/16 1119 Last data filed at 08/30/16 1000  Gross per 24 hour  Intake              920 ml  Output             3400 ml  Net            -2480 ml   I/P  -5 L   General: Alert, awake, oriented x3, in no acute distress Neck:  JVP is normal Heart: Regular rate and rhythm, without murmurs, rubs, gallops.  Lungs: Clear to auscultation.  No rales or wheezes. Exemities:  1+ edema.   Neuro: Grossly intact, nonfocal.  TEel  Afib  60s Lab Results: Results for orders placed or performed during the hospital encounter of 08/27/16 (from the past 24 hour(s))  Basic metabolic panel     Status: Abnormal   Collection Time: 08/30/16  3:37 AM  Result Value Ref Range   Sodium 138 135 - 145 mmol/L   Potassium 3.7 3.5 - 5.1 mmol/L   Chloride 97 (L) 101 - 111 mmol/L   CO2 37 (H) 22 - 32 mmol/L   Glucose, Bld 80 65 - 99 mg/dL   BUN 18 6 - 20 mg/dL   Creatinine, Ser 1.00 0.61 - 1.24 mg/dL   Calcium 8.0 (L) 8.9 - 10.3 mg/dL   GFR calc non Af Amer >60 >60 mL/min   GFR calc Af Amer >60 >60 mL/min   Anion gap 4 (L) 5 - 15  Protime-INR     Status: Abnormal   Collection Time: 08/30/16  3:37 AM  Result Value Ref Range   Prothrombin Time 29.8 (H) 11.4 - 15.2 seconds   INR 2.77     Studies/Results: No results found.  Medications: REviewed     @PROBHOSP @  1  Acute on chronic diastolic CHF  Continue IV diuresis with lasix    2  Chronic atrial fib  CRate control  Coumadin    3  Venous insuff  4.  CAD   No symtpoms to sugg angina   5  Severe MP  S/p St Jued MVR    6  Pain  Patient very particular  Wants pain med + muscle relaxant 3x per day  When he wants to take  Sleeping pill at bed time dose aas well   LOS: 3 days   Dorris Carnes 08/30/2016, 11:19 AM

## 2016-08-30 NOTE — Progress Notes (Addendum)
Page sent to Melina Copa, PA requesting clarification on the oxycodone order. Situation in previous note explained to the Dunn,PA.    Upon callback Melina Copa, PA stated per Dr Harrington Challenger she will adjust the order and word it correctly.   Correction for misspelled PA's name

## 2016-08-30 NOTE — Progress Notes (Signed)
Upon attempt to give patient his 16:00 robaxin 30 minutes early to appease his request for pain medication, s/p refusal of tylenol; pt became agitated regarding lack of more frequent oxycodone orders. pt was informed that the order has not been changed to more frequent PRN than it was previously. Pt then proceeded to remove his cpap mask; which he has donned all day. Pt threw the mask at this nurse, so purposely that the velcro strap became attached to her sleeve.    Pt demanded discharge papers as he continued to thrash and groan in his bed.

## 2016-08-30 NOTE — Progress Notes (Signed)
Patient was already on CPAP upon entering room

## 2016-08-30 NOTE — Progress Notes (Signed)
Message relayed to Dr Harrington Challenger regarding patient's request for increased frequency of oxycodone orders.

## 2016-08-30 NOTE — Progress Notes (Addendum)
Pain med order clarfied with Dr. Harrington Challenger - oxycodone 5mg  TIDPRN and robaxin TID PRN, up to 3 doses each max per 24 hours. Kingslee Mairena PA-C

## 2016-08-30 NOTE — Progress Notes (Signed)
ANTICOAGULATION CONSULT NOTE - Initial Consult  Pharmacy Consult for Warfarin Indication: atrial fibrillation and mechanical mitral valve  Assessment: 55 yoM with CHF admitted 12/27 for worsening dyspnea, orthopnea, and edema after an office visit. Patient has a Probation officer mitral valve placed in 12/2014. Prior to admission, patient was anticoagulated with warfarin. Last outpatient INR visit was 12/19 -INR 2.77, CBC stable.    PTA warfarin dose: 5mg  MWF, 7.5mg  TuThSaSu with an INR of 2.9 (therapeutic).   Goal of Therapy:  INR 2.5 - 3.5 Monitor platelets by anticoagulation protocol: Yes   Plan:  Warfarin 7.5mg  x 1 dose tonight Monitor daily INR and CBC as needed Monitor s/sx's of bleeding   Allergies  Allergen Reactions  . Penicillins Other (See Comments)    REACTION: Hives Has patient had a PCN reaction causing immediate rash, facial/tongue/throat swelling, SOB or lightheadedness with hypotension: unsure Has patient had a PCN reaction causing severe rash involving mucus membranes or skin necrosis: unsure Has patient had a PCN reaction that required hospitalization unsure Has patient had a PCN reaction occurring within the last 10 years: yes If all of the above answers are "NO", then may proceed with Cephalosporin use.  . Adhesive [Tape] Other (See Comments)    Shin irritation  . Bactrim [Sulfamethoxazole-Trimethoprim] Other (See Comments)    CDIF    Patient Measurements: Height: 5\' 1"  (154.9 cm) Weight: 244 lb 14.4 oz (111.1 kg) IBW/kg (Calculated) : 52.3  Vital Signs: Temp: 97.6 F (36.4 C) (12/30 0449) Temp Source: Oral (12/30 0449) BP: 112/58 (12/30 0449) Pulse Rate: 62 (12/30 0449)  Labs:  Recent Labs  08/28/16 0317 08/29/16 0354 08/30/16 0337  HGB 9.5*  --   --   HCT 34.8*  --   --   PLT 164  --   --   LABPROT 26.8* 31.0* 29.8*  INR 2.42 2.90 2.77  CREATININE 0.88 0.93 1.00    Estimated Creatinine Clearance: 89.5 mL/min (by C-G formula based  on SCr of 1 mg/dL).   Medical History: Past Medical History:  Diagnosis Date  . Acute respiratory failure with hypoxemia ([place])   . Anemia   . Anxiety   . Aortic aneurysm ([place])   . Aortic aneurysm and dissection ([place])   . ARDS (adult respiratory distress syndrome) ([place]) 01/27/2015  . Arthritis   . Ascending aortic dissection ([place]) 07/14/2008   Localized dissection of ascending aorta noted on CTA in 2009 and stable on CTA in 2011  . Asymptomatic chronic venous hypertension 01/15/2010   Overview:  Overview:  Qualifier: Diagnosis of  By: [name] MD, [name]   Last Assessment & Plan:  I suggested he go to a vein clinic to see if he could be qualified compression hose of some type, since he is not a surgical candidate according to the vascular surgeons.   . Atrial fibrillation ([place])    chronic persistent  . Bell's palsy   . CAD (coronary artery disease)    Old scar inferior wall myoview, 10/2009 EF 52%.  He did have previous SVG to RCA but no obstructive disease noted on his most recent cath.  SVG occluded.   . Cellulitis 04/02/2014  . Cerebral artery occlusion with cerebral infarction ([place]) 10/08/2011   Overview:  Overview:  And hx of TIA prior to CABG, all thought due to systemic emboli prior to coumadin   . CHF (congestive heart failure) ([place])   . Chronic diastolic congestive heart failure ([place])   . Chronic diastolic heart failure ([place])  03/01/2015  . Chronic LBP 10/08/2011  . CVA (cerebral vascular accident) ([place]) 10/08/2011   And hx of TIA prior to CABG, all thought due to systemic emboli prior to coumadin   . Diabetes mellitus    diet controlled  . DIABETES MELLITUS, TYPE II 11/01/2009   Qualifier: Diagnosis of  By: [name] MD, [name]    . Diverticulosis   . Dysrhythmia    Dr. [name] follows-LOV 05-08-16 Epic.  . 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 Encephalopathy acute 02/05/2015  . Erectile dysfunction 10/08/2011  . GERD (gastroesophageal reflux disease)    not needing medication at thhis time- 01/22/15  . Gout   . H/O mechanical aortic valve replacement 11/01/2009   Qualifier: Diagnosis of  By: [name], [name]    . Hiatal hernia   . History of adenomatous polyp of colon 12/23/2011   Colonoscopy January 2010, 6 mm rectal tubulovillous adenoma. No high-grade dysplasia   . History of colon polyps 12/23/2011   Overview:  Overview:  Colonoscopy January 2010, 6 mm rectal tubulovillous adenoma. No high-grade dysplasia   . Hyperlipidemia   . Hypertension   . Hypogonadism male 04/08/2012  . Impaired glucose tolerance 10/08/2011  . Leg pain 06/28/2010  . Morbid obesity ([place]) 04/02/2014  . Myocardial infarction age 23  . Obesity   . OSA (obstructive sleep apnea)    CPAP  . Peripheral vascular disease ([place])   . Reflux 11/06/09  . 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 [id number]   33 mm St Jude bileaflet mechanical valve placed via right mini thoracotomy approach  . 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 Severe mitral regurgitation 10/11/2014  . Shortness of breath dyspnea    with exertion  . Stroke [facility])    TIA and Stroke no residual effect- 1st was 64  . Testicular cancer ([place])   . TIA (transient ischemic attack)    age 52  . Varicose veins     Medications:  Scheduled:  . allopurinol  300 mg Oral Daily  . atenolol  50 mg Oral BID  . digoxin  250 mcg Oral Daily  . furosemide  80 mg Intravenous BID  . magnesium oxide  400 mg Oral BID  . methocarbamol  500 mg Oral TID  . potassium chloride SA  20 mEq Oral BID  . sodium chloride flush  3 mL Intravenous Q12H  . Warfarin - Pharmacist Dosing Inpatient   Does not apply Cook, PharmD, BCPS Pharmacy Resident Pager: [phone] 08/30/2016  11:00 AM

## 2016-08-31 DIAGNOSIS — Z952 Presence of prosthetic heart valve: Secondary | ICD-10-CM

## 2016-08-31 DIAGNOSIS — Z954 Presence of other heart-valve replacement: Secondary | ICD-10-CM

## 2016-08-31 DIAGNOSIS — I4891 Unspecified atrial fibrillation: Secondary | ICD-10-CM

## 2016-08-31 LAB — BASIC METABOLIC PANEL
ANION GAP: 8 (ref 5–15)
BUN: 16 mg/dL (ref 6–20)
CO2: 34 mmol/L — ABNORMAL HIGH (ref 22–32)
Calcium: 7.9 mg/dL — ABNORMAL LOW (ref 8.9–10.3)
Chloride: 96 mmol/L — ABNORMAL LOW (ref 101–111)
Creatinine, Ser: 0.95 mg/dL (ref 0.61–1.24)
GFR calc Af Amer: >60 mL/min (ref >60)
Glucose, Bld: 65 mg/dL (ref 65–99)
POTASSIUM: 3.7 mmol/L (ref 3.5–5.1)
SODIUM: 138 mmol/L (ref 135–145)

## 2016-08-31 LAB — PROTIME-INR
INR: 2.54
PROTHROMBIN TIME: 27.8 s — AB (ref 11.4–15.2)

## 2016-08-31 MED ORDER — WARFARIN SODIUM 7.5 MG PO TABS
7.5000 mg | ORAL_TABLET | Freq: Once | ORAL | Status: AC
  Administered 2016-08-31: 7.5 mg via ORAL
  Filled 2016-08-31: qty 1

## 2016-08-31 NOTE — Progress Notes (Addendum)
ANTICOAGULATION CONSULT NOTE - Follow Up Consult  Pharmacy Consult for Warfarin Indication: atrial fibrillation and mechanical mitral valve  Assessment: 55 yoM with CHF admitted 12/27 for worsening dyspnea, orthopnea, and edema after an office visit. Patient has a Probation officer mitral valve placed in 12/2014. Prior to admission, patient was anticoagulated with warfarin. Last outpatient INR visit was 12/19 -INR 2.54 and at goal, CBC stable.    PTA warfarin dose: 5mg  MWF, 7.5mg  TuThSaSu  Goal of Therapy:  INR 2.5 - 3.5 Monitor platelets by anticoagulation protocol: Yes   Plan:  Warfarin 7.5mg  x 1 dose tonight Monitor daily INR and CBC as needed Monitor s/sx's of bleeding  Angela Burke, PharmD, BCPS Pharmacy Resident Pager: [phone]   Allergies  Allergen Reactions  . Penicillins Other (See Comments)    REACTION: Hives Has patient had a PCN reaction causing immediate rash, facial/tongue/throat swelling, SOB or lightheadedness with hypotension: unsure Has patient had a PCN reaction causing severe rash involving mucus membranes or skin necrosis: unsure Has patient had a PCN reaction that required hospitalization unsure Has patient had a PCN reaction occurring within the last 10 years: yes If all of the above answers are "NO", then may proceed with Cephalosporin use.  . Adhesive [Tape] Other (See Comments)    Shin irritation  . Bactrim [Sulfamethoxazole-Trimethoprim] Other (See Comments)    CDIF    Patient Measurements: Height: 5\' 1"  (154.9 cm) Weight: 236 lb (107 kg) IBW/kg (Calculated) : 52.3  Vital Signs: Temp: 98 F (36.7 C) (12/31 1200) Temp Source: Oral (12/31 1200) BP: 115/68 (12/31 1200) Pulse Rate: 65 (12/31 1200)  Labs:  Recent Labs  08/29/16 0354 08/30/16 0337 08/31/16 0205  LABPROT 31.0* 29.8* 27.8*  INR 2.90 2.77 2.54  CREATININE 0.93 1.00 0.95    Estimated Creatinine Clearance: 92.2 mL/min (by C-G formula based on SCr of 0.95  mg/dL).   Medical History: Past Medical History:  Diagnosis Date  . Acute respiratory failure with hypoxemia ([place])   . Anemia   . Anxiety   . Aortic aneurysm ([place])   . Aortic aneurysm and dissection ([place])   . ARDS (adult respiratory distress syndrome) ([place]) 01/27/2015  . Arthritis   . Ascending aortic dissection ([place]) 07/14/2008   Localized dissection of ascending aorta noted on CTA in 2009 and stable on CTA in 2011  . Asymptomatic chronic venous hypertension 01/15/2010   Overview:  Overview:  Qualifier: Diagnosis of  By: [name] MD, [name]   Last Assessment & Plan:  I suggested he go to a vein clinic to see if he could be qualified compression hose of some type, since he is not a surgical candidate according to the vascular surgeons.   . Atrial fibrillation ([place])    chronic persistent  . Bell's palsy   . CAD (coronary artery disease)    Old scar inferior wall myoview, 10/2009 EF 52%.  He did have previous SVG to RCA but no obstructive disease noted on his most recent cath.  SVG occluded.   . Cellulitis 04/02/2014  . Cerebral artery occlusion with cerebral infarction ([place]) 10/08/2011   Overview:  Overview:  And hx of TIA prior to CABG, all thought due to systemic emboli prior to coumadin   . CHF (congestive heart failure) ([place])   . Chronic diastolic congestive heart failure ([place])   . Chronic diastolic heart failure ([place]) 03/01/2015  . Chronic LBP 10/08/2011  . CVA (cerebral vascular accident) ([place]) 10/08/2011   And hx of TIA prior to CABG,  all thought due to systemic emboli prior to coumadin   . Diabetes mellitus    diet controlled  . DIABETES MELLITUS, TYPE II 11/01/2009   Qualifier: Diagnosis of  By: [name] MD, [name]    . Diverticulosis   . Dysrhythmia    Dr. [name] follows-LOV 05-08-16 Epic.  . 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  Encephalopathy acute 02/05/2015  . Erectile dysfunction 10/08/2011  . GERD (gastroesophageal reflux disease)    not needing medication at thhis time- 01/22/15  . Gout   . H/O mechanical aortic valve replacement 11/01/2009   Qualifier: Diagnosis of  By: [name], [name]    . Hiatal hernia   . History of adenomatous polyp of colon 12/23/2011   Colonoscopy January 2010, 6 mm rectal tubulovillous adenoma. No high-grade dysplasia   . History of colon polyps 12/23/2011   Overview:  Overview:  Colonoscopy January 2010, 6 mm rectal tubulovillous adenoma. No high-grade dysplasia   . Hyperlipidemia   . Hypertension   . Hypogonadism male 04/08/2012  . Impaired glucose tolerance 10/08/2011  . Leg pain 06/28/2010  . Morbid obesity ([place]) 04/02/2014  . Myocardial infarction age 55  . Obesity   . OSA (obstructive sleep apnea)    CPAP  . Peripheral vascular disease ([place])   . Reflux 11/06/09  . 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 [id number]   33 mm St Jude bileaflet mechanical valve placed via right mini thoracotomy approach  . 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 Severe mitral regurgitation 10/11/2014  . Shortness of breath dyspnea    with exertion  . Stroke [facility])    TIA and Stroke no residual effect- 1st was 19  . Testicular cancer ([place])   . TIA (transient ischemic attack)    age 55  . Varicose veins     Medications:  Scheduled:  . allopurinol  300 mg Oral Daily  . atenolol  50 mg Oral BID  . digoxin  250 mcg Oral Daily  . furosemide  80 mg Intravenous Q8H  . magnesium oxide  400 mg Oral BID  . potassium chloride SA  20 mEq Oral TID  . sodium chloride flush  3 mL Intravenous Q12H  . Warfarin - Pharmacist Dosing Inpatient   Does not apply [place], PharmD, BCPS Pharmacy Resident Pager: [phone] 08/31/2016 1:12 PM

## 2016-08-31 NOTE — Progress Notes (Signed)
   Subjective: Breathing is some better  No CP  Pain med regimen is working   Objective: Vitals:   08/30/16 0449 08/30/16 1200 08/30/16 2013 08/31/16 0506  BP: (!) 112/58 122/60 (!) 119/54 118/67  Pulse: 62 62 63 (!) 59  Resp: 18 18 18 18   Temp: 97.6 F (36.4 C) 98 F (36.7 C) 98 F (36.7 C) 98.3 F (36.8 C)  TempSrc: Oral Oral Oral Oral  SpO2: 100% 100% 100% 100%  Weight: 244 lb 14.4 oz (111.1 kg)   236 lb (107 kg)  Height:       Weight change: -8 lb 14.4 oz (-4.037 kg)  Intake/Output Summary (Last 24 hours) at 08/31/16 1138 Last data filed at 08/31/16 1057  Gross per 24 hour  Intake              579 ml  Output             3700 ml  Net            -3121 ml   Net neg 10 L    General: Alert, awake, oriented x3, in no acute distress Neck:  JVP is diffiuclt to assessl Heart: Regular rate and rhythm, without murmurs, rubs, gallops.  Lungs: Clear to auscultation.  No rales or wheezes. Exemities:  1+ edema  Tr in thigh.   Neuro: Grossly intact, nonfocal.  Tele  Afib    Lab Results: Results for orders placed or performed during the hospital encounter of 08/27/16 (from the past 24 hour(s))  Basic metabolic panel     Status: Abnormal   Collection Time: 08/31/16  2:05 AM  Result Value Ref Range   Sodium 138 135 - 145 mmol/L   Potassium 3.7 3.5 - 5.1 mmol/L   Chloride 96 (L) 101 - 111 mmol/L   CO2 34 (H) 22 - 32 mmol/L   Glucose, Bld 65 65 - 99 mg/dL   BUN 16 6 - 20 mg/dL   Creatinine, Ser 0.95 0.61 - 1.24 mg/dL   Calcium 7.9 (L) 8.9 - 10.3 mg/dL   GFR calc non Af Amer >60 >60 mL/min   GFR calc Af Amer >60 >60 mL/min   Anion gap 8 5 - 15  Protime-INR     Status: Abnormal   Collection Time: 08/31/16  2:05 AM  Result Value Ref Range   Prothrombin Time 27.8 (H) 11.4 - 15.2 seconds   INR 2.54     Studies/Results: No results found.  Medications: Reviewed   @PROBHOSP @  1  Acute on chronic diastolic CHF  Pt continues to diurese.  His BUN / Cr have still not bumped   Urine is light color  I would continue IV lasix tid   Guide will be bump in labs He was on lasix 20 am 40 PM prior to admit   This dose will need to be increased  Consider switch to bumex for better absorption    2  Chronic atrial fib  Rates controlled    3  CAD  No symptoms of angina    4  S/P MVR  Cont coumadin    Anticipate d/c Tues  LOS: 4 days   Dorris Carnes 08/31/2016, 11:38 AM

## 2016-08-31 NOTE — Progress Notes (Signed)
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 Says pain is under control.  Inpatient Medications    Scheduled Meds: . allopurinol  300 mg Oral Daily  . atenolol  50 mg Oral BID  . digoxin  250 mcg Oral Daily  . furosemide  80 mg Intravenous Q8H  . magnesium oxide  400 mg Oral BID  . potassium chloride SA  20 mEq Oral TID  . sodium chloride flush  3 mL Intravenous Q12H  . Warfarin - Pharmacist Dosing Inpatient   Does not apply q1800   Continuous Infusions: . sodium chloride 20 mL/hr at 08/30/16 2103   PRN Meds: sodium chloride, acetaminophen, methocarbamol, ondansetron (ZOFRAN) IV, oxyCODONE, sodium chloride flush, temazepam   Vital Signs    Vitals:   08/30/16 0449 08/30/16 1200 08/30/16 2013 08/31/16 0506  BP: (!) 112/58 122/60 (!) 119/54 118/67  Pulse: 62 62 63 (!) 59  Resp: 18 18 18 18   Temp: 97.6 F (36.4 C) 98 F (36.7 C) 98 F (36.7 C) 98.3 F (36.8 C)  TempSrc: Oral Oral Oral Oral  SpO2: 100% 100% 100% 100%  Weight: 244 lb 14.4 oz (111.1 kg)   236 lb (107 kg)  Height:        Intake/Output Summary (Last 24 hours) at 08/31/16 1209 Last data filed at 08/31/16 1057  Gross per 24 hour  Intake              579 ml  Output             3700 ml  Net            -3121 ml   Filed Weights   08/29/16 0551 08/30/16 0449 08/31/16 0506  Weight: 250 lb (113.4 kg) 244 lb 14.4 oz (111.1 kg) 236 lb  (107 kg)    Physical Exam    GEN: Well nourished, well developed, in no acute distress.  HEENT: Grossly normal.  Neck: Supple, no JVD, carotid bruits, or masses. Cardiac: Regular rate, irregular rhythm, prosthetic click, soft systolic murmur along left sternal border. Stasis dermatitis b/l with 1+ pretibial edema b/l. Respiratory:  Respirations regular and unlabored, clear to auscultation bilaterally. GI: Obese. MS: no deformity or atrophy. Neuro:  Strength intact. Psych: AAOx3.  Normal affect.  Labs    CBC No results for input(s): WBC, NEUTROABS, HGB, HCT, MCV, PLT in the last 72 hours. Basic Metabolic Panel  Recent Labs  08/30/16 0337 08/31/16 0205  NA 138 138  K 3.7 3.7  CL 97* 96*  CO2 37* 34*  GLUCOSE 80 65  BUN 18 16  CREATININE 1.00 0.95  CALCIUM 8.0* 7.9*   Liver Function Tests No results for input(s): AST, ALT, ALKPHOS, BILITOT, PROT, ALBUMIN in the last 72 hours. No results for input(s): LIPASE, AMYLASE in the last 72 hours. Cardiac Enzymes No results for input(s): CKTOTAL, CKMB, CKMBINDEX, TROPONINI in the last 72 hours. BNP Invalid  input(s): POCBNP D-Dimer No results for input(s): DDIMER in the last 72 hours. Hemoglobin A1C No results for input(s): HGBA1C in the last 72 hours. Fasting Lipid Panel No results for input(s): CHOL, HDL, LDLCALC, TRIG, CHOLHDL, LDLDIRECT in the last 72 hours. Thyroid Function Tests No results for input(s): TSH, T4TOTAL, T3FREE, THYROIDAB in the last 72 hours.  Invalid input(s): FREET3  Telemetry    A fib- Personally Reviewed  ECG    - Personally Reviewed  Radiology    No results found.  Cardiac Studies     Patient Profile     55 y.o. male w/ PMH of AVR (s/p Bentall procedure in 1988), severe mitral regurgitation (s/p St Jude mechanical valve placed in 12/2014), CAD (s/p SVG-RCA, known to be occluded by cath in 12/2014), chronic atrial fibrillation, chronic diastolic CHF, prior CVA, chronic venous  insufficiency, OSA, OSA (on CPAP) and recurrent C. difficile infection who was directly admitted from the office on 12/27 for acute on chronic diastolic CHF with weight up 32 lbs from baseline.   Assessment & Plan    1. Acute on Chronic Diastolic CHF - seen in the office on 12/27 and noted to be significantly volume overloaded by physical exam with complaints of worsening dyspnea with exertion, orthopnea, and generalized edema. Weight up 33 lbs from baseline (226 --> 258 lbs) as he reported being noncompliant with increased PO Lasix dosing.  - on IV Lasix 80mg  TID. Recorded output of 8.5 L in last 48+, weight down from 258 lbs to 236bs. Creatinine stable at 0.95. Continue with IV diuresis.   2. Chronic Atrial Fibrillation -This patients CHA2DS2-VASc Score and unadjusted Ischemic Stroke Rate (% per year) is equal to 3.2 % stroke rate/year from a score of 3 (CHF, HTN, Vascular). Continue Coumadin for anticoagulation. Appreciate Pharmacy's assistance with dosing.  - HR well-controlled. Continue BB.   3. Venous Insufficiency of Both Extremities - followed by [facility]. Has been instructed to use his compression machine for 6+ hours daily to assist with his edema, but uses this intermittently. Compliance with this regimen strongly encouraged.   4. CAD without Angina Pectoris - s/p SVG-RCA, known to be occluded by cath in 12/2014.  - denies any recent anginal symptoms.   5. Severe MR - s/p St Jude mechanical valve placed in 12/2014. - continue Coumadin per Pharmacy dosing during admission.   6. OSA -continue CPAP.   Signed, Kate Sable, MD  08/31/2016, 12:09 PM

## 2016-09-01 LAB — BASIC METABOLIC PANEL
ANION GAP: 10 (ref 5–15)
BUN: 14 mg/dL (ref 6–20)
CALCIUM: 8 mg/dL — AB (ref 8.9–10.3)
CO2: 34 mmol/L — AB (ref 22–32)
Chloride: 93 mmol/L — ABNORMAL LOW (ref 101–111)
Creatinine, Ser: 0.86 mg/dL (ref 0.61–1.24)
Glucose, Bld: 61 mg/dL — ABNORMAL LOW (ref 65–99)
Potassium: 3.5 mmol/L (ref 3.5–5.1)
Sodium: 137 mmol/L (ref 135–145)

## 2016-09-01 LAB — PROTIME-INR
INR: 2.31
PROTHROMBIN TIME: 25.8 s — AB (ref 11.4–15.2)

## 2016-09-01 MED ORDER — WARFARIN SODIUM 10 MG PO TABS
10.0000 mg | ORAL_TABLET | Freq: Once | ORAL | Status: AC
  Administered 2016-09-01: 10 mg via ORAL
  Filled 2016-09-01: qty 1

## 2016-09-01 NOTE — Progress Notes (Signed)
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  CURRENT MEDS . allopurinol  300 mg Oral Daily  . atenolol  50 mg Oral BID  . digoxin  250 mcg Oral Daily  . furosemide  80 mg Intravenous Q8H  . magnesium oxide  400 mg Oral BID  . potassium chloride SA  20 mEq Oral TID  . sodium chloride flush  3 mL Intravenous Q12H  . warfarin  10 mg Oral ONCE-1800  . Warfarin - Pharmacist Dosing Inpatient   Does not apply q1800    OBJECTIVE  Vitals:   08/31/16 1200 08/31/16 2015 09/01/16 0040 09/01/16 0604  BP: 115/68 125/65 103/62 (!) 105/54  Pulse: 65 71 69 (!) 53  Resp: 18 20 18    Temp: 98 F (36.7 C) 98.3 F (36.8 C) 98.1 F (36.7 C) (!) 93.7 F (34.3 C)  TempSrc: Oral Oral Oral Oral  SpO2: 100% 100% 100% 100%  Weight:    229 lb 6.4 oz (104.1 kg)  Height:        Intake/Output Summary (Last 24 hours) at 09/01/16 1000 Last data filed at 09/01/16 0640  Gross per 24 hour  Intake              480 ml  Output             4600 ml  Net            -4120 ml   Filed Weights   08/30/16 0449 08/31/16 0506 09/01/16 0604  Weight: 244 lb 14.4 oz (111.1 kg) 236 lb (107 kg) 229 lb 6.4 oz (104.1 kg)    PHYSICAL EXAM  General: Pleasant, NAD. Neuro: Alert and oriented X 3. Moves all extremities spontaneously. Psych: Normal affect. HEENT:  Normal  Neck: Supple without bruits or JVD. Lungs:  Resp regular and unlabored, CTA. Heart: RRR with mechanical S1 and S2. Abdomen: Soft,  non-tender, non-distended, BS + x 4.  Extremities: No clubbing, cyanosis. DP/PT/Radials 2+ and equal bilaterally. 2+ peripheral edema bilaterally  Accessory Clinical Findings  CBC No results for input(s): WBC, NEUTROABS, HGB, HCT, MCV, PLT in the last 72 hours. Basic Metabolic Panel  Recent Labs  08/31/16 0205 09/01/16 0435  NA 138 137  K 3.7 3.5  CL 96* 93*  CO2 34* 34*  GLUCOSE 65 61*  BUN 16 14  CREATININE 0.95 0.86  CALCIUM 7.9* 8.0*   Liver Function Tests No results for input(s): AST, ALT, ALKPHOS, BILITOT, PROT, ALBUMIN in the last 72 hours. No results for input(s): LIPASE, AMYLASE in the last 72 hours. Cardiac Enzymes No results for input(s): CKTOTAL, CKMB, CKMBINDEX, TROPONINI in the last 72 hours. BNP Invalid input(s): POCBNP D-Dimer No results for input(s): DDIMER in the last 72 hours. Hemoglobin A1C No results for input(s): HGBA1C in the last 72 hours. Fasting Lipid Panel No results for input(s): CHOL, HDL, LDLCALC, TRIG, CHOLHDL, LDLDIRECT in the last 72 hours. Thyroid Function Tests No results for input(s): TSH, T4TOTAL, T3FREE, THYROIDAB in  the last 72 hours.  Invalid input(s): FREET3  TELE  Atrial fib with a  Controlled VR  Radiology/Studies  No results found.  ASSESSMENT AND PLAN  1. Acute on chronic diastolic heart failure - his heart failure is improved. He is close to his dry weight. Continue his IV lasix. Will watch weights. Would like him to get another 5 or 10 lbs off if possible. 2. Atrial fib - his ventricular rate is well controlled. Will follow.  3. Peripheral edema - he has no evidence of cellulitis. However, he notes that he has some pruritus. If he gets worse, then would restart benadryl.   Carleene Overlie Allsion [place],M.D.  09/01/2016 10:00 AMPatient ID: Lance Cook, male   DOB: 01/29/1961, 56 y.o.   MRN: [id number]

## 2016-09-01 NOTE — Progress Notes (Signed)
ANTICOAGULATION CONSULT NOTE - Follow Up Consult  Pharmacy Consult for Warfarin Indication: atrial fibrillation and mechanical mitral valve  Assessment: 80 yoM with CHF admitted 12/27 for worsening dyspnea, orthopnea, and edema after an office visit. Patient has a Probation officer mitral valve placed in 12/2014. Prior to admission, patient was anticoagulated with warfarin. Last outpatient INR visit was 12/19  pta warfarin for AFib and mechanical mitral valve. INR 2.12 on admission (last pta dose 12/26). CHADSVASC 3 -Last outpatient INR 2.9 on 12/19; INR goal 2.5-3.5  -PTA dose 5mg  MWF, 7.5mg  aod -INR 2.31 today -CBC stable, no s/s of bleeding  Goal of Therapy:  INR 2.5 - 3.5 Monitor platelets by anticoagulation protocol: Yes   Plan:  -Warfarin 10mg  x 1 dose tonight -Monitor daily INR and CBC as needed -Monitor s/sx's of bleeding - cbc in am   Allergies  Allergen Reactions  . Penicillins Other (See Comments)    REACTION: Hives Has patient had a PCN reaction causing immediate rash, facial/tongue/throat swelling, SOB or lightheadedness with hypotension: unsure Has patient had a PCN reaction causing severe rash involving mucus membranes or skin necrosis: unsure Has patient had a PCN reaction that required hospitalization unsure Has patient had a PCN reaction occurring within the last 10 years: yes If all of the above answers are "NO", then may proceed with Cephalosporin use.  . Adhesive [Tape] Other (See Comments)    Shin irritation  . Bactrim [Sulfamethoxazole-Trimethoprim] Other (See Comments)    CDIF    Patient Measurements: Height: 5\' 1"  (154.9 cm) Weight: 229 lb 6.4 oz (104.1 kg) IBW/kg (Calculated) : 52.3  Vital Signs: Temp: 93.7 F (34.3 C) (01/01 0604) Temp Source: Oral (01/01 0604) BP: 105/54 (01/01 0604) Pulse Rate: 53 (01/01 0604)  Labs:  Recent Labs  08/30/16 0337 08/31/16 0205 09/01/16 0435  LABPROT 29.8* 27.8* 25.8*  INR 2.77 2.54 2.31   CREATININE 1.00 0.95 0.86    Estimated Creatinine Clearance: 100.2 mL/min (by C-G formula based on SCr of 0.86 mg/dL).   Levester Fresh, PharmD, BCPS, BCCCP Clinical Pharmacist 09/01/2016 8:00 AM

## 2016-09-02 ENCOUNTER — Ambulatory Visit (HOSPITAL_COMMUNITY): Admission: RE | Admit: 2016-09-02 | Payer: Medicare HMO | Source: Ambulatory Visit | Admitting: Internal Medicine

## 2016-09-02 ENCOUNTER — Encounter (HOSPITAL_COMMUNITY): Admission: RE | Payer: Self-pay | Source: Ambulatory Visit

## 2016-09-02 LAB — CBC
HEMATOCRIT: 33.3 % — AB (ref 39.0–52.0)
HEMOGLOBIN: 9 g/dL — AB (ref 13.0–17.0)
MCH: 18 pg — AB (ref 26.0–34.0)
MCHC: 27 g/dL — ABNORMAL LOW (ref 30.0–36.0)
MCV: 66.7 fL — AB (ref 78.0–100.0)
Platelets: 123 10*3/uL — ABNORMAL LOW (ref 150–400)
RBC: 4.99 MIL/uL (ref 4.22–5.81)
RDW: 19.8 % — ABNORMAL HIGH (ref 11.5–15.5)
WBC: 6.2 10*3/uL (ref 4.0–10.5)

## 2016-09-02 LAB — PROTIME-INR
INR: 2.43
PROTHROMBIN TIME: 26.9 s — AB (ref 11.4–15.2)

## 2016-09-02 SURGERY — COLONOSCOPY WITH PROPOFOL
Anesthesia: Monitor Anesthesia Care

## 2016-09-02 MED ORDER — WARFARIN SODIUM 10 MG PO TABS
10.0000 mg | ORAL_TABLET | Freq: Once | ORAL | Status: AC
  Administered 2016-09-02: 10 mg via ORAL
  Filled 2016-09-02: qty 1

## 2016-09-02 MED ORDER — FUROSEMIDE 80 MG PO TABS
80.0000 mg | ORAL_TABLET | Freq: Two times a day (BID) | ORAL | Status: DC
  Administered 2016-09-03: 80 mg via ORAL
  Filled 2016-09-02: qty 1

## 2016-09-02 NOTE — Progress Notes (Signed)
Patient Name: Lance Cook Date of Encounter: 09/02/2016  Primary Cardiologist: Dr. 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 He is now greater than 17 L negative since admission.  Inpatient Medications    Scheduled Meds: . allopurinol  300 mg Oral Daily  . atenolol  50 mg Oral BID  . digoxin  250 mcg Oral Daily  . furosemide  80 mg Intravenous Q8H  . magnesium oxide  400 mg Oral BID  . potassium chloride SA  20 mEq Oral TID  . sodium chloride flush  3 mL Intravenous Q12H  . Warfarin - Pharmacist Dosing Inpatient   Does not apply q1800   Continuous Infusions: . sodium chloride 20 mL/hr at 08/30/16 2103   PRN Meds: sodium chloride, acetaminophen, methocarbamol, ondansetron (ZOFRAN) IV, oxyCODONE, sodium chloride flush, temazepam   Vital Signs    Vitals:   09/01/16 2214 09/02/16 0549 09/02/16 1028 09/02/16 1149  BP: (!) 118/51 (!) 116/58  122/69  Pulse: 70 62 75 65  Resp: 18 18  18   Temp: 98.4 F (36.9 C) 97.9 F (36.6 C)  98.5 F (36.9 C)  TempSrc: Oral Oral  Oral  SpO2: 100% 100%  100%  Weight:  224 lb 11.2 oz (101.9 kg)    Height:        Intake/Output Summary (Last 24 hours) at 09/02/16 1741 Last data filed at 09/02/16 1149  Gross per 24 hour  Intake              840 ml  Output             3800 ml  Net            -2960 ml   Filed Weights   08/31/16 0506 09/01/16 0604 09/02/16 0549  Weight: 236 lb (107 kg) 229 lb 6.4 oz (104.1 kg) 224 lb 11.2 oz (101.9 kg)  The weight is down from 254  on 08/28/16-224 pounds today.  Physical Exam    GEN: Obese Caucasian male appearing in no acute distress. CPAP in place.   HEENT: Grossly normal.  Neck: Supple, no carotid bruits, or masses. JVD remains at 9cm Cardiac: Irregularly irregular, no rubs, or gallops. 2/6 SEM at apex. Crisp valve sounds present. No clubbing. Significant reduction in edema since I last visited with the patient. up to thighs bilaterally. Radials/DP/PT 2+ and equal bilaterally.  Respiratory:  Respirations regular and unlabored, clear to auscultation bilaterally. GI: Soft, nontender, nondistended, BS + x 4. MS: no deformity or atrophy. Skin: warm and dry, no rash. Neuro:  Strength and sensation are intact. Psych: AAOx3.  Normal affect.  Labs    CBC  Recent Labs  09/02/16 0420  WBC 6.2  HGB 9.0*  HCT 33.3*  MCV 66.7*  PLT [id number]*   Basic Metabolic Panel  Recent Labs  08/31/16 0205 09/01/16 0435  NA 138 137  K 3.7 3.5  CL 96* 93*  CO2 34* 34*  GLUCOSE 65 61*  BUN 16 14  CREATININE 0.95 0.86  CALCIUM 7.9* 8.0*     Telemetry    Atrial fibrillation, HR in 60's. - Personally Reviewed  ECG   No new tracings.   Radiology    No results found.  Cardiac Studies   Echocardiogram: 12/2015 Study Conclusions  - Left ventricle: The cavity size was normal. There was severe concentric hypertrophy. Systolic function was vigorous. The estimated ejection fraction was in the range of 65% to 70%. Wall motion was normal; there were no regional wall motion abnormalities. The study is not technically sufficient to allow evaluation of LV diastolic function. - Aortic valve: A mechanical prosthesis was present and functioning normally. Peak velocity (S): 345 cm/s. Mean gradient (S): 28 mm Hg. (Consistent with moderate aortic stenosis gradient) - Aorta: Post Bentall with ST Jude Mechanical Valve in 1988. Aortic arch diameter: 40 mm. - Aortic arch: The aortic arch was mildly dilated. -  Mitral valve: A mechanical prosthesis was present and functioning normally. - Left atrium: The atrium was severely dilated. - Right ventricle: The cavity size was mildly dilated. Wall thickness was normal. - Right atrium: The atrium was moderately dilated. - Atrial septum: The septum bowed from left to right, consistent with increased left atrial pressure. - Tricuspid valve: There was moderate regurgitation. - Pulmonary arteries: Systolic pressure was moderately to severely increased. PA peak pressure: 67 mm Hg (S).  Impressions:  - There was no evidence of a vegetation.  Patient Profile     56 y.o. male w/ PMH of AVR (s/p Bentall procedure in 1988), severe mitral regurgitation (s/p St Jude mechanical valve placed in 12/2014), CAD (s/p SVG-RCA, known to be occluded by cath in 12/2014), chronic atrial fibrillation, chronic diastolic CHF, prior CVA, chronic venous insufficiency, OSA, OSA (on CPAP) and recurrent C. difficile infection who was directly admitted from the office on 12/27 for acute on chronic diastolic CHF with weight up 32 lbs from baseline.   Assessment & Plan    1. Acute on Chronic Diastolic CHF - There is significant diuresis. We will switch to oral diuretic therapy starting with the next dose. Likely eligible for discharge in a.m. if no problems overnight.   2. Chronic Atrial Fibrillation - This patients CHA2DS2-VASc Score and unadjusted Ischemic Stroke Rate (% per year) is equal to 3.2 % stroke rate/year from a score of 3 (CHF, HTN, Vascular). Continue Coumadin for anticoagulation. Appreciate Pharmacy's assistance with dosing.  - HR well-controlled. Continue BB.   3. Venous Insufficiency of Both Extremities - followed by V[facility] Clinic at [facility]. Has been instructed to use his compression machine for 6+ hours daily to assist with his edema, but uses this intermittently. Compliance with this regimen strongly encouraged.   4. CAD without Angina Pectoris -  s/p SVG-RCA, known to be occluded by cath in 12/2014.  - denies any recent anginal symptoms.   5. Severe MR - s/p St Jude mechanical valve placed in 12/2014. - continue Coumadin per Pharmacy dosing during admission.   6. OSA -continue CPAP.   Signed, Sinclair Grooms, MD  09/02/2016, 5:41 PM   The patient has been seen in conjunction with Reino Bellis, NP. All aspects of care have been considered and discussed. The patient has been personally interviewed, examined, and all clinical data has been reviewed.   Improve diuresis with 80 mg of furosemide every 12 hours. We will be slightly more aggressive given stable kidney function. Increase to every 8 hours. Monitor kidney function closely.

## 2016-09-02 NOTE — Progress Notes (Signed)
Educated patient about safety. Pt stated that he does not need bed alarm (refused) and that if assistance is needed he will call. Will continue to monitor.  Ayeshia Coppin, RN

## 2016-09-02 NOTE — Progress Notes (Addendum)
Pt requesting his pain medication and robaxin at this time. Told patient that I would bring his pain medication and Robaxin at 1523 when it is technically due, due to the max of 3 doses in a 24 hour period. Pt has already had his 3 doses in this 24 hour period since 09/01/16 at 1523. Pt agitated, but verbalized understanding. Will continue to monitor.

## 2016-09-02 NOTE — Progress Notes (Signed)
ANTICOAGULATION CONSULT NOTE - Follow Up Consult  Pharmacy Consult for Warfarin Indication: atrial fibrillation and mechanical mitral valve  Assessment: 66 yoM with CHF admitted 12/27 for worsening dyspnea, orthopnea, and edema after an office visit. Patient has a Probation officer mitral valve placed in 12/2014. Prior to admission, patient was anticoagulated with warfarin. Last outpatient INR visit was 12/19  pta warfarin for AFib and mechanical mitral valve. INR 2.12 on admission , below goal of 2.5-3.5. (last pta dose 12/26). CHADSVASC 3 -Last outpatient INR 2.9 on 12/19; INR goal 2.5-3.5  -PTA dose 5mg  MWF, 7.5mg  aod -INR 2.43 today, increasing toward goal of  INR 2.5 - 3.5. -CBC: Hgb low/stable, PLTC decreased 164 >123k,   no s/s of bleeding  Goal of Therapy:  INR 2.5 - 3.5 Monitor platelets by anticoagulation protocol: Yes   Plan:  -Warfarin 10mg  x 1 dose tonight -Monitor daily INR and CBC as needed -Monitor s/sx's of bleeding    Allergies  Allergen Reactions  . Penicillins Other (See Comments)    REACTION: Hives Has patient had a PCN reaction causing immediate rash, facial/tongue/throat swelling, SOB or lightheadedness with hypotension: unsure Has patient had a PCN reaction causing severe rash involving mucus membranes or skin necrosis: unsure Has patient had a PCN reaction that required hospitalization unsure Has patient had a PCN reaction occurring within the last 10 years: yes If all of the above answers are "NO", then may proceed with Cephalosporin use.  . Adhesive [Tape] Other (See Comments)    Shin irritation  . Bactrim [Sulfamethoxazole-Trimethoprim] Other (See Comments)    CDIF    Patient Measurements: Height: 5\' 1"  (154.9 cm) Weight: 224 lb 11.2 oz (101.9 kg) IBW/kg (Calculated) : 52.3  Vital Signs: Temp: 98.5 F (36.9 C) (01/02 1149) Temp Source: Oral (01/02 1149) BP: 122/69 (01/02 1149) Pulse Rate: 65 (01/02 1149)  Labs:  Recent Labs   08/31/16 0205 09/01/16 0435 09/02/16 0420  HGB  --   --  9.0*  HCT  --   --  33.3*  PLT  --   --  123*  LABPROT 27.8* 25.8* 26.9*  INR 2.54 2.31 2.43  CREATININE 0.95 0.86  --     Estimated Creatinine Clearance: 99 mL/min (by C-G formula based on SCr of 0.86 mg/dL).   Levester Fresh, PharmD, BCPS, BCCCP Clinical Pharmacist 09/02/2016 12:21 PM

## 2016-09-03 LAB — BASIC METABOLIC PANEL
Anion gap: 4 — ABNORMAL LOW (ref 5–15)
BUN: 15 mg/dL (ref 6–20)
CHLORIDE: 93 mmol/L — AB (ref 101–111)
CO2: 40 mmol/L — AB (ref 22–32)
Calcium: 8.2 mg/dL — ABNORMAL LOW (ref 8.9–10.3)
Creatinine, Ser: 1.1 mg/dL (ref 0.61–1.24)
GFR calc Af Amer: >60 mL/min (ref >60)
GFR calc non Af Amer: >60 mL/min (ref >60)
GLUCOSE: 75 mg/dL (ref 65–99)
Potassium: 4.2 mmol/L (ref 3.5–5.1)
Sodium: 137 mmol/L (ref 135–145)

## 2016-09-03 LAB — URINALYSIS, ROUTINE W REFLEX MICROSCOPIC
Bilirubin Urine: NEGATIVE
Glucose, UA: NEGATIVE mg/dL
Ketones, ur: NEGATIVE mg/dL
Leukocytes, UA: NEGATIVE
Nitrite: NEGATIVE
PROTEIN: 30 mg/dL — AB
SQUAMOUS EPITHELIAL / LPF: NONE SEEN
Specific Gravity, Urine: 1.015 (ref 1.005–1.030)
pH: 6 (ref 5.0–8.0)

## 2016-09-03 LAB — MAGNESIUM: Magnesium: 1.8 mg/dL (ref 1.7–2.4)

## 2016-09-03 LAB — CBC
HEMATOCRIT: 33.4 % — AB (ref 39.0–52.0)
HEMOGLOBIN: 9.2 g/dL — AB (ref 13.0–17.0)
MCH: 18.3 pg — ABNORMAL LOW (ref 26.0–34.0)
MCHC: 27.5 g/dL — ABNORMAL LOW (ref 30.0–36.0)
MCV: 66.3 fL — ABNORMAL LOW (ref 78.0–100.0)
Platelets: 127 10*3/uL — ABNORMAL LOW (ref 150–400)
RBC: 5.04 MIL/uL (ref 4.22–5.81)
RDW: 19.2 % — AB (ref 11.5–15.5)
WBC: 7.7 10*3/uL (ref 4.0–10.5)

## 2016-09-03 LAB — PROTIME-INR
INR: 3.18
Prothrombin Time: 33.3 seconds — ABNORMAL HIGH (ref 11.4–15.2)

## 2016-09-03 MED ORDER — FUROSEMIDE 80 MG PO TABS: 80.0000 mg | ORAL_TABLET | Freq: Two times a day (BID) | ORAL | 3 refills | Status: DC

## 2016-09-03 NOTE — Discharge Summary (Signed)
Discharge Summary    Patient ID: Lance Cook,  MRN: [id number], DOB/AGE: Oct 02, 1960 56 y.o.  Admit date: 08/27/2016 Discharge date: 09/03/2016  Primary Care Provider: [name] Primary Cardiologist: Dr. 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 Penicillins Other (See Comments)    REACTION: Hives Has patient had a PCN reaction causing immediate rash, facial/tongue/throat swelling, SOB or lightheadedness with hypotension: unsure Has patient had a PCN reaction causing severe rash involving mucus membranes or skin necrosis: unsure Has patient had a PCN reaction that required hospitalization unsure Has patient had a PCN reaction occurring within the last 10 years: yes If all of the above answers are "NO", then may proceed with Cephalosporin use.  . Adhesive [Tape] Other (See Comments)    Shin irritation  . Bactrim [Sulfamethoxazole-Trimethoprim] Other (See Comments)    CDIF    Diagnostic Studies/Procedures    None _____________   History of Present Illness     Lance Cook is a 55 y.o. male with past medical history of AVR (s/p Bentall procedure in 1988), severe mitral regurgitation (s/p St Jude mechanical valve placed in 12/2014), CAD (s/p SVG-RCA, known to be occluded by cath in 12/2014), chronic atrial fibrillation, chronic diastolic CHF, prior CVA, chronic venous insufficiency, OSA, OSA (on CPAP) and recurrent C. difficile infection who presented to the office on 08/27/16 for evaluation of worsening  dyspnea, orthopnea, and edema.  Was last seen by Bernerd Pho on 07/14/2016 for ER follow-up. Reported worsening lower extremity edema despite using his compression machine for his chronic lymphedema.  Weight at the time of his office visit was 239 lbs (up from 230 lbs in 10/17). BNP was minimally elevated at 168. His Lasix dosing was increased from 20mg  BID to 20mg  in AM/40mg  in PM.   In the interim, he called the office 6 days ago saying he was barely able to walk 2-3 steps without having significant dyspnea. He was advised to go to the ER but did not wish to do this.   In talking with the patient, he reports the increased Lasix dosing worked for approximately one week, having achieved a weight of 226 lbs on 11/27. However, the increased diuretic dosing interfered with household duties as he is the only source of transportation for his family. Therefore, he frequently skipped morning doses of Lasix and would only take 20mg  Lasix at night instead of the recommended 40mg . He was also using his lower extremity compression devices 1-2 times per week instead of 4 times daily as prescribed.   His edema progressed to the extent he was having significant difficulty walking. Also reported difficulty urinating due to scrotal edema. Sleeps with 2-3 pillows at night and with his CPAP machine, but still having orthopnea. Denies any chest discomfort or palpitations.   Hospital Course     Consultants: None   He was direct admitted to the hospital with acute on chronic diastolic HF plans for IV diuresis and repeat echo. Weight on admission was noted to be up 33lbs from baseline.  His renal function remained stable, and IV lasix was increased. A foley cath was attempted to be placed, but unable. Coude cath was placed instead. His weight trended down to 222lbs at the time of discharge with a stable Cr. Total net output of 21L this admission. He did develop some hematuria on 09/02/16. UA was positive for Hgb, but  rare bacteria. Urology was consulted briefly over the phone. Felt this may have been trauma related to insertion of coude cath. Foley was removed, and he was able to urinate without difficulty. He was instructed to monitor his urine over the next couple of days given he is on coumadin. If bleeding does not resolve he was instructed to follow up with his urologist at [facility], and inform our office. Hgb was stable at 9.2. His breathing improved significantly. His home dose of lasix will be increased to 80mg  PO BID.   He was seen and assessed by Dr. Tamala Julian on 09/03/16 and determined stable for discharge home. I have arranged for close follow up in the office next week to check BMET and INR levels. He was instructed on diet and that he needs to weigh himself everyday to monitor his fluid status, and inform us of weight changes. He will also have [facility] RN follow up that was arranged prior to discharge.  _____________  Discharge Vitals Blood pressure (!) 138/54, pulse 64, temperature 98.7 F (37.1 C), temperature source Oral, resp. rate 18, height 5\' 1"  (1.549 m), weight 222 lb 3.2 oz (100.8 kg), SpO2 95 %.  Filed Weights   09/01/16 0604 09/02/16 0549 09/03/16 0434  Weight: 229 lb 6.4 oz (104.1 kg) 224 lb 11.2 oz (101.9 kg) 222 lb 3.2 oz (100.8 kg)    Labs & Radiologic Studies    CBC  Recent Labs  09/02/16 0420 09/03/16 0116  WBC 6.2 7.7  HGB 9.0* 9.2*  HCT 33.3* 33.4*  MCV 66.7* 66.3*  PLT 123* [id number]*   Basic Metabolic Panel  Recent Labs  09/01/16 0435 09/03/16 0116  NA 137 137  K 3.5 4.2  CL 93* 93*  CO2 34* 40*  GLUCOSE 61* 75  BUN 14 15  CREATININE 0.86 1.10  CALCIUM 8.0* 8.2*  MG  --  1.8   Liver Function Tests No results for input(s): AST, ALT, ALKPHOS, BILITOT, PROT, ALBUMIN in the last 72 hours. No results for input(s): LIPASE, AMYLASE in the last 72 hours. Cardiac Enzymes No results for input(s): CKTOTAL, CKMB, CKMBINDEX, TROPONINI in the last 72 hours. BNP Invalid  input(s): POCBNP D-Dimer No results for input(s): DDIMER in the last 72 hours. Hemoglobin A1C No results for input(s): HGBA1C in the last 72 hours. Fasting Lipid Panel No results for input(s): CHOL, HDL, LDLCALC, TRIG, CHOLHDL, LDLDIRECT in the last 72 hours. Thyroid Function Tests No results for input(s): TSH, T4TOTAL, T3FREE, THYROIDAB in the last 72 hours.  Invalid input(s): FREET3 _____________  No results found. Disposition   Pt is being discharged home today in good condition.  Follow-up Plans & Appointments    Follow-up [facility], [place] Follow up on 09/09/2016.   Specialties:  Physician Assistant, Cardiology Why:  at 10:45am for your follow up appt.  Contact information: [address] [place] [address] [facility] Follow up.   Why:  A nurse will go to your home for ongoing education Contact information: [address] [place] [address] [phone]  Discharge Instructions    (HEART FAILURE PATIENTS) Call MD:  Anytime you have any of the following symptoms: 1) 3 pound weight gain in 24 hours or 5 pounds in 1 week 2) shortness of breath, with or without a dry hacking cough 3) swelling in the hands, feet or stomach 4) if you have to sleep on extra pillows at night in order to breathe.    Complete by:  As directed    Diet - low sodium heart healthy    Complete by:  As directed    Discharge instructions    Complete by:  As directed    For patients with congestive heart failure, we give them these special instructions:  1. Follow a low-salt diet and watch your fluid intake. In general, you should not be taking in more than 2 liters of fluid per day (no more than 8 glasses per day). Some patients are restricted to less than 1.5 liters of fluid per day (no more than 6 glasses per day). This includes sources of water in foods like soup, coffee, tea, milk, etc. 2. Weigh  yourself on the same scale at same time of day and keep a log. 3. Call your doctor: (Anytime you feel any of the following symptoms)  - 3-4 pound weight gain in 1-2 days or 2 pounds overnight  - Shortness of breath, with or without a dry hacking cough  - Swelling in the hands, feet or stomach  - If you have to sleep on extra pillows at night in order to breathe   IT IS IMPORTANT TO LET YOUR DOCTOR KNOW EARLY ON IF YOU ARE HAVING SYMPTOMS SO WE CAN HELP YOU!   Please monitor your urine over the next couple of days as you did have bleeding this admission. If you develop worsening bleeding, pain with urination, decreased urine output, difficulty urinating or clots, please contact our office and your urologist for further instructions.   Face-to-face encounter (required for Medicare/Medicaid patients)    Complete by:  As directed    I Reino Bellis certify that this patient is under my care and that I, or a nurse practitioner or physician's assistant working with me, had a face-to-face encounter that meets the physician face-to-face encounter requirements with this patient on 09/03/2016. The encounter with the patient was in whole, or in part for the following medical condition(s) which is the primary reason for home health care (List medical condition): CHF   The encounter with the patient was in whole, or in part, for the following medical condition, which is the primary reason for home health care:  CHF   I certify that, based on my findings, the following services are medically necessary home health services:  Nursing   Reason for Medically Necessary Home Health Services:  Skilled Nursing- Change/Decline in Patient Status   My clinical findings support the need for the above services:  Shortness of breath with activity   Further, I certify that my clinical findings support that this patient is homebound due to:  Shortness of Breath with activity   Home Health    Complete by:  As directed    To  provide the following care/treatments:  RN   Increase activity slowly    Complete by:  As directed       Discharge Medications   Current Discharge Medication List    CONTINUE these medications which have CHANGED   Details  furosemide (LASIX) 80 MG tablet Take 1 tablet (80 mg total)  by mouth 2 (two) times daily. Qty: 60 tablet, Refills: 3      CONTINUE these medications which have NOT CHANGED   Details  acetaminophen (TYLENOL) 500 MG tablet Take 500 mg by mouth every 8 (eight) hours as needed for moderate pain or headache.    Associated Diagnoses: Chronic pain    allopurinol (ZYLOPRIM) 300 MG tablet Take 300 mg by mouth daily.    atenolol (TENORMIN) 100 MG tablet Take 0.5 tablets (50 mg total) by mouth 2 (two) times daily.    digoxin (LANOXIN) 0.25 MG tablet TAKE 1 TABLET EVERY DAY Qty: 90 tablet, Refills: 2    magnesium oxide (MAG-OX) 400 (241.3 MG) MG tablet Take 1 tablet (400 mg total) by mouth 2 (two) times daily. Qty: 180 tablet, Refills: 3    methocarbamol (ROBAXIN) 500 MG tablet Take 1 tablet (500 mg total) by mouth 3 (three) times daily. Qty: 90 tablet, Refills: 2   Associated Diagnoses: Musculoskeletal pain    oxyCODONE (OXY IR/ROXICODONE) 5 MG immediate release tablet Take 1 tablet (5 mg total) by mouth every 8 (eight) hours as needed for severe pain. Qty: 90 tablet, Refills: 0   Associated Diagnoses: Chronic pain syndrome    potassium chloride (K-DUR) 10 MEQ tablet Take 2 tablets (20 mEq total) by mouth 2 (two) times daily. Qty: 120 tablet, Refills: 1    temazepam (RESTORIL) 15 MG capsule Take 15 mg by mouth at bedtime as needed for sleep.    warfarin (COUMADIN) 5 MG tablet TAKE 1 TO 1 AND 1/2 TABLETS DAILY AS DIRECTED BY COUMADIN CLINIC Qty: 135 tablet, Refills: 0    bacitracin 500 UNIT/GM ointment Apply 1 application topically as needed for wound care.     clindamycin (CLEOCIN) 300 MG capsule Take 300 mg by mouth as needed (for dental appointments).        STOP taking these medications     Potassium 99 MG TABS           Outstanding Labs/Studies   BMET, and INR at follow up appt.   Duration of Discharge Encounter   Greater than 30 minutes including physician time.  Signed, Reino Bellis NP-C 09/03/2016, 4:16 PM  The patient has been seen in conjunction with Reino Bellis, NP-C. All aspects of care have been considered and discussed. The patient has been personally interviewed, examined, and all clinical data has been reviewed.   Stable and improved compared with admission status.  Discharge weight 222 lbs.  Needs TOC f/u with labs in 7 days to adjust diuretic and reassess volume status. Diuretic intensity may need to be cut back.

## 2016-09-03 NOTE — Discharge Instructions (Signed)
Warfarin tablets What is this medicine? WARFARIN (WAR far in) is an anticoagulant. It is used to treat or prevent clots in the veins, arteries, lungs, or heart. This medicine may be used for other purposes; ask your health care provider or pharmacist if you have questions. COMMON BRAND NAME(S): Coumadin, Jantoven What should I tell my health care provider before I take this medicine? They need to know if you have any of these conditions: -alcoholism -anemia -bleeding disorders -cancer -diabetes -heart disease -high blood pressure -history of bleeding in the gastrointestinal tract -history of stroke or other brain injury or disease -kidney or liver disease -protein C deficiency -protein S deficiency -psychosis or dementia -recent injury, recent or planned surgery or procedure -an unusual or allergic reaction to warfarin, other medicines, foods, dyes, or preservatives -pregnant or trying to get pregnant -breast-feeding How should I use this medicine? Take this medicine by mouth with a glass of water. Follow the directions on the prescription label. You can take this medicine with or without food. Take your medicine at the same time each day. Do not take it more often than directed. Do not stop taking except on your doctor's advice. Stopping this medicine may increase your risk of a blood clot. Be sure to refill your prescription before you run out of medicine. If your doctor or healthcare professional calls to change your dose, write down the dose and any other instructions. Always read the dose and instructions back to him or her to make sure you understand them. Tell your doctor or healthcare professional what strength of tablets you have on hand. Ask how many tablets you should take to equal your new dose. Write the date on the new instructions and keep them near your medicine. If you are told to stop taking your medicine until your next blood test, call your doctor or healthcare  professional if you do not hear anything within 24 hours of the test to find out your new dose or when to restart your prior dose. A special MedGuide will be given to you by the pharmacist with each prescription and refill. Be sure to read this information carefully each time. Talk to your pediatrician regarding the use of this medicine in children. Special care may be needed. Overdosage: If you think you have taken too much of this medicine contact a poison control center or emergency room at once. NOTE: This medicine is only for you. Do not share this medicine with others. What if I miss a dose? It is important not to miss a dose. If you miss a dose, call your healthcare provider. Take the dose as soon as possible on the same day. If it is almost time for your next dose, take only that dose. Do not take double or extra doses to make up for a missed dose. What may interact with this medicine? Do not take this medicine with any of the following medications: -agents that prevent or dissolve blood clots -aspirin or other salicylates -danshen -dextrothyroxine -mifepristone -St. John's Wort -red yeast rice This medicine may also interact with the following medications: -acetaminophen -agents that lower cholesterol -alcohol -allopurinol -amiodarone -antibiotics or medicines for treating bacterial, fungal or viral infections -azathioprine -barbiturate medicines for inducing sleep or treating seizures -certain medicines for diabetes -certain medicines for heart rhythm problems -certain medicines for high blood pressure -chloral hydrate -cisapride -disulfiram -male hormones, including contraceptive or birth control pills -general anesthetics -herbal or dietary products like garlic, ginkgo, ginseng, green tea, or kava  kava -influenza virus vaccine -male hormones -medicines for mental depression or psychosis -medicines for some types of cancer -medicines for stomach  problems -methylphenidate -NSAIDs, medicines for pain and inflammation, like ibuprofen or naproxen -propoxyphene -quinidine, quinine -raloxifene -seizure or epilepsy medicine like carbamazepine, phenytoin, and valproic acid -steroids like cortisone and prednisone -tamoxifen -thyroid medicine -tramadol -vitamin c, vitamin e, and vitamin K -zafirlukast -zileuton This list may not describe all possible interactions. Give your health care provider a list of all the medicines, herbs, non-prescription drugs, or dietary supplements you use. Also tell them if you smoke, drink alcohol, or use illegal drugs. Some items may interact with your medicine. What should I watch for while using this medicine? Visit your doctor or health care professional for regular checks on your progress. You will need to have a blood test called a PT/INR regularly. The PT/INR blood test is done to make sure you are getting the right dose of this medicine. It is important to not miss your appointment for the blood tests. When you first start taking this medicine, these tests are done often. Once the correct dose is determined and you take your medicine properly, these tests can be done less often. Notify your doctor or health care professional and seek emergency treatment if you develop breathing problems; changes in vision; chest pain; severe, sudden headache; pain, swelling, warmth in the leg; trouble speaking; sudden numbness or weakness of the face, arm or leg. These can be signs that your condition has gotten worse. While you are taking this medicine, carry an identification card with your name, the name and dose of medicine(s) being used, and the name and phone number of your doctor or health care professional or person to contact in an emergency. Do not start taking or stop taking any medicines or over-the-counter medicines except on the advice of your doctor or health care professional. You should discuss your diet with  your doctor or health care professional. Do not make major changes in your diet. Vitamin K can affect how well this medicine works. Many foods contain vitamin K. It is important to eat a consistent amount of foods with vitamin K. Other foods with vitamin K that you should eat in consistent amounts are asparagus, basil, beef or pork liver, black eyed peas, broccoli, brussel sprouts, cabbage, chickpeas, cucumber with peel, green onions, green tea, okra, parsley, peas, thyme, and green leafy vegetables like beet greens, collard greens, endive, kale, mustard greens, spinach, turnip greens, watercress, or certain lettuces like green leaf or romaine. This medicine can cause birth defects or bleeding in an unborn child. Women of childbearing age should use effective birth control while taking this medicine. If a woman becomes pregnant while taking this medicine, she should discuss the potential risks and her options with her health care professional. Avoid sports and activities that might cause injury while you are using this medicine. Severe falls or injuries can cause unseen bleeding. Be careful when using sharp tools or knives. Consider using an Copy. Take special care brushing or flossing your teeth. Report any injuries, bruising, or red spots on the skin to your doctor or health care professional. If you have an illness that causes vomiting, diarrhea, or fever for more than a few days, contact your doctor. Also check with your doctor if you are unable to eat for several days. These problems can change the effect of this medicine. Even after you stop taking this medicine, it takes several days before your body recovers  its normal ability to clot blood. Ask your doctor or health care professional how long you need to be careful. If you are going to have surgery or dental work, tell your doctor or health care professional that you have been taking this medicine. What side effects may I notice from  receiving this medicine? Side effects that you should report to your doctor or health care professional as soon as possible: -allergic reactions like skin rash, itching or hives, swelling of the face, lips, or tongue -breathing problems -chest pain -dizziness -headache -heavy menstrual bleeding or vaginal bleeding -pain in the lower back or side -painful, blue or purple toes -painful skin ulcers that do not go away -signs and symptoms of bleeding such as bloody or black, tarry stools; red or dark-brown urine; spitting up blood or brown material that looks like coffee grounds; red spots on the skin; unusual bruising or bleeding from the eye, gums, or nose -stomach pain -unusually weak or tired Side effects that usually do not require medical attention (report to your doctor or health care professional if they continue or are bothersome): -diarrhea -hair loss This list may not describe all possible side effects. Call your doctor for medical advice about side effects. You may report side effects to FDA at 1-800-FDA-1088. Where should I keep my medicine? Keep out of the reach of children. Store at room temperature between 15 and 30 degrees C (59 and 86 degrees F). Protect from light. Throw away any unused medicine after the expiration date. Do not flush down the toilet. NOTE: This sheet is a summary. It may not cover all possible information. If you have questions about this medicine, talk to your doctor, pharmacist, or health care provider.  Information on my medicine - Coumadin   (Warfarin)-- You were taking Coumadin (Warfarin) prior to this hospital admission for history of chronic atrial fibrillation and St Jude mechanical mitral valve placed in 12/2014.   GOAL INR 2.5 - 3.5.   This medication education was reviewed with me or my healthcare representative as part of my discharge preparation.  Why was Coumadin prescribed for you?  Coumadin was prescribed for you because you have a blood  clot or a medical condition that can cause an increased risk of forming blood clots. Blood clots can cause serious health problems by blocking the flow of blood to the heart, lung, or brain. Coumadin can prevent harmful blood clots from forming. As a reminder your indication for Coumadin is:   Blood Clot Prevention After Heart Valve Surgery and history of atrial fibrillation.  What test will check on my response to Coumadin? While on Coumadin (warfarin) you will need to have an INR test regularly to ensure that your dose is keeping you in the desired range. The INR (international normalized ratio) number is calculated from the result of the laboratory test called prothrombin time (PT).  If an INR APPOINTMENT HAS NOT ALREADY BEEN MADE FOR YOU please schedule an appointment to have this lab work done by your health care provider within 7 days. Your INR goal is 2.5-3.5 due to history of St. Jude mechanical mitral valve.   Ask your health care provider during an office visit what your goal INR is.  What  do you need to  know  About  COUMADIN? Take Coumadin (warfarin) exactly as prescribed by your healthcare provider about the same time each day.  DO NOT stop taking without talking to the doctor who prescribed the medication.  Stopping without other  blood clot prevention medication to take the place of Coumadin may increase your risk of developing a new clot or stroke.  Get refills before you run out.  What do you do if you miss a dose? If you miss a dose, take it as soon as you remember on the same day then continue your regularly scheduled regimen the next day.  Do not take two doses of Coumadin at the same time.  Important Safety Information A possible side effect of Coumadin (Warfarin) is an increased risk of bleeding. You should call your healthcare provider right away if you experience any of the following: ? Bleeding from an injury or your nose that does not stop. ? Unusual colored urine (red or  dark brown) or unusual colored stools (red or black). ? Unusual bruising for unknown reasons. ? A serious fall or if you hit your head (even if there is no bleeding).  Some foods or medicines interact with Coumadin (warfarin) and might alter your response to warfarin. To help avoid this: ? Eat a balanced diet, maintaining a consistent amount of Vitamin K. ? Notify your provider about major diet changes you plan to make. ? Avoid alcohol or limit your intake to 1 drink for women and 2 drinks for men per day. (1 drink is 5 oz. wine, 12 oz. beer, or 1.5 oz. liquor.)  Make sure that ANY health care provider who prescribes medication for you knows that you are taking Coumadin (warfarin).  Also make sure the healthcare provider who is monitoring your Coumadin knows when you have started a new medication including herbals and non-prescription products.  Coumadin (Warfarin)  Major Drug Interactions  Increased Warfarin Effect Decreased Warfarin Effect  Alcohol (large quantities) Antibiotics (esp. Septra/Bactrim, Flagyl, Cipro) Amiodarone (Cordarone) Aspirin (ASA) Cimetidine (Tagamet) Megestrol (Megace) NSAIDs (ibuprofen, naproxen, etc.) Piroxicam (Feldene) Propafenone (Rythmol SR) Propranolol (Inderal) Isoniazid (INH) Posaconazole (Noxafil) Barbiturates (Phenobarbital) Carbamazepine (Tegretol) Chlordiazepoxide (Librium) Cholestyramine (Questran) Griseofulvin Oral Contraceptives Rifampin Sucralfate (Carafate) Vitamin K   Coumadin (Warfarin) Major Herbal Interactions  Increased Warfarin Effect Decreased Warfarin Effect  Garlic Ginseng Ginkgo biloba Coenzyme Q10 Green tea St. Johns wort    Coumadin (Warfarin) FOOD Interactions  Eat a consistent number of servings per week of foods HIGH in Vitamin K (1 serving =  cup)  Collards (cooked, or boiled & drained) Kale (cooked, or boiled & drained) Mustard greens (cooked, or boiled & drained) Parsley *serving size only =   cup Spinach (cooked, or boiled & drained) Swiss chard (cooked, or boiled & drained) Turnip greens (cooked, or boiled & drained)  Eat a consistent number of servings per week of foods MEDIUM-HIGH in Vitamin K (1 serving = 1 cup)  Asparagus (cooked, or boiled & drained) Broccoli (cooked, boiled & drained, or raw & chopped) Brussel sprouts (cooked, or boiled & drained) *serving size only =  cup Lettuce, raw (green leaf, endive, romaine) Spinach, raw Turnip greens, raw & chopped   These websites have more information on Coumadin (warfarin):  FailFactory.se; VeganReport.com.au;

## 2016-09-03 NOTE — Progress Notes (Signed)
Patient Name: Lance Cook Date of Encounter: 09/03/2016  Primary Cardiologist: Dr. 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 We have arbitrarily chosen 80 mg twice a day. Was previously on 40 mg in the evening and 20 mg in the mornings. He has been noncompliant with diuretic therapy.  Inpatient Medications    Scheduled Meds: . allopurinol  300 mg Oral Daily  . atenolol  50 mg Oral BID  . digoxin  250 mcg Oral Daily  . furosemide  80 mg Oral BID  . magnesium oxide  400 mg Oral BID  . potassium chloride SA  20 mEq Oral TID  . sodium chloride flush  3 mL Intravenous Q12H  . Warfarin - Pharmacist Dosing Inpatient   Does not apply q1800   Continuous Infusions: . sodium chloride 20 mL/hr at 08/30/16 2103   PRN Meds: sodium chloride, acetaminophen, methocarbamol, ondansetron (ZOFRAN) IV, oxyCODONE, sodium chloride flush, temazepam   Vital Signs    Vitals:   09/02/16 1028 09/02/16 1149 09/02/16 2054 09/03/16 0434  BP:  122/69 (!) 134/92 (!) 116/54  Pulse: 75 65 91 (!) 59  Resp:  18 18 18   Temp:  98.5 F (36.9 C) 97.8 F (36.6 C) 98.2 F (36.8 C)  TempSrc:  Oral Oral Oral  SpO2:  100% 95% 100%  Weight:    222 lb 3.2 oz (100.8 kg)  Height:        Intake/Output Summary (Last 24 hours) at 09/03/16 1038 Last data filed at 09/03/16 0900  Gross per 24 hour  Intake             1080 ml  Output             3400 ml  Net            -2320 ml   Filed Weights   09/01/16 0604  09/02/16 0549 09/03/16 0434  Weight: 229 lb 6.4 oz (104.1 kg) 224 lb 11.2 oz (101.9 kg) 222 lb 3.2 oz (100.8 kg)  The weight is down from 254 on 08/28/16-224 pounds today.  Physical Exam    GEN: Obese Caucasian male appearing in no acute distress. CPAP in place.   HEENT: Grossly normal.  Neck: Supple, no carotid bruits, or masses. JVD remains at 9cm Cardiac: Irregularly irregular, no rubs, or gallops. 2/6 SEM at apex. Crisp valve sounds present. No clubbing. Significant reduction in edema since I last visited with the patient. up to thighs bilaterally. Radials/DP/PT 2+ and equal bilaterally.  Respiratory:  Respirations regular and unlabored, clear to auscultation bilaterally. GI: Soft, nontender, nondistended, BS + x 4. MS: no deformity or atrophy. Skin: warm and dry, no rash. Neuro:  Strength and sensation are intact. Psych: AAOx3.  Normal affect.  Labs    CBC  Recent Labs  09/02/16 0420 09/03/16 0116  WBC 6.2 7.7  HGB 9.0* 9.2*  HCT 33.3* 33.4*  MCV 66.7* 66.3*  PLT 123* [id number]*   Basic Metabolic Panel  Recent Labs  09/01/16 0435 09/03/16 0116  NA 137 137  K 3.5 4.2  CL 93* 93*  CO2 34* 40*  GLUCOSE 61* 75  BUN 14 15  CREATININE 0.86 1.10  CALCIUM 8.0* 8.2*  MG  --  1.8     Telemetry    Atrial fibrillation, HR in 60's. - Personally Reviewed  ECG   No new tracings.   Radiology    No results found.  Cardiac Studies   Echocardiogram: 12/2015 Study Conclusions  - Left ventricle: The cavity size was normal. There was severe concentric hypertrophy. Systolic function was vigorous. The estimated ejection fraction was in the range of 65% to 70%. Wall motion was normal; there were no regional wall motion abnormalities. The study is not technically sufficient to allow evaluation of LV diastolic function. - Aortic valve: A mechanical prosthesis was present and functioning normally. Peak velocity (S): 345 cm/s. Mean gradient (S): 28 mm Hg.  (Consistent with moderate aortic stenosis gradient) - Aorta: Post Bentall with ST Jude Mechanical Valve in 1988. Aortic arch diameter: 40 mm. - Aortic arch: The aortic arch was mildly dilated. - Mitral valve: A mechanical prosthesis was present and functioning normally. - Left atrium: The atrium was severely dilated. - Right ventricle: The cavity size was mildly dilated. Wall thickness was normal. - Right atrium: The atrium was moderately dilated. - Atrial septum: The septum bowed from left to right, consistent with increased left atrial pressure. - Tricuspid valve: There was moderate regurgitation. - Pulmonary arteries: Systolic pressure was moderately to severely increased. PA peak pressure: 67 mm Hg (S).  Impressions:  - There was no evidence of a vegetation.  Patient Profile     56 y.o. male w/ PMH of AVR (s/p Bentall procedure in 1988), severe mitral regurgitation (s/p St Jude mechanical valve placed in 12/2014), CAD (s/p SVG-RCA, known to be occluded by cath in 12/2014), chronic atrial fibrillation, chronic diastolic CHF, prior CVA, chronic venous insufficiency, OSA, OSA (on CPAP) and recurrent C. difficile infection who was directly admitted from the office on 12/27 for acute on chronic diastolic CHF with weight up 32 lbs from baseline.   Assessment & Plan    1. Acute on Chronic Diastolic CHF - Oral diuretic chosen is furosemide 80 mg twice a day. -Needs 5-7 day basic metabolic panel and transition of care follow-up to make adjustments in diuretic regimen if needed based upon kidney function. -Plan discharge today after ambulating   2. Chronic Atrial Fibrillation - This patients CHA2DS2-VASc Score and unadjusted Ischemic Stroke Rate (% per year) is equal to 3.2 % stroke rate/year from a score of 3 (CHF, HTN, Vascular). Continue Coumadin for anticoagulation. Appreciate Pharmacy's assistance with dosing.  - HR well-controlled. Continue BB.   3. Venous  Insufficiency of Both Extremities - followed by [facility] at [facility]. Has been instructed to use his compression machine for 6+ hours daily to assist with his edema, but uses this intermittently. Compliance with this regimen strongly encouraged.   4. CAD without Angina Pectoris - s/p SVG-RCA, known to be occluded by cath in 12/2014.  - denies any recent anginal symptoms.   5. Severe MR - s/p St Jude mechanical valve placed in 12/2014. - continue Coumadin per Pharmacy dosing during admission.   6. OSA -continue CPAP.   Signed, Sinclair Grooms, MD  09/03/2016, 10:38 AM

## 2016-09-03 NOTE — Care Management Note (Signed)
Case Management Note  Patient Details  Name: Lance Cook MRN: [id number] Date of Birth: March 04, 1961  Subjective/Objective:   Admitted with CHF                 Action/Plan: Patient lives at home with spouse; PCP:  [name], PA-C; has private insurance with [facility] Medicare with prescription drug coverage; patient needs ongoing education for CHF; [place] choice offered, pt requested [facility]; Butch Penny with [facility] called for arrangements.  Expected Discharge Date:   possibly 09/03/2016               Expected Discharge Plan:  [place]  Discharge planning Services  CM Consult  Choice offered to:  Patient  HH Arranged:  RN, Disease Management [place] Agency:  [place]  Status of Service:  In process, will continue to follow  Royston [address] [place] ,[place] [phone] 09/03/2016, 12:36 PM

## 2016-09-03 NOTE — Progress Notes (Addendum)
ANTICOAGULATION CONSULT NOTE - Follow Up Consult  Pharmacy Consult for Warfarin Indication: h/o  atrial fibrillation and mechanical mitral valve (12/2014)  Assessment: 50 yoM with CHF admitted 12/27 for worsening dyspnea, orthopnea, and edema after an office visit. Patient has a Probation officer mitral valve placed in 12/2014. Prior to admission, patient was anticoagulated with warfarin. Last outpatient INR visit was 12/19  pta warfarin for AFib and mechanical mitral valve. INR 2.12 on admission , below goal of 2.5-3.5. (last pta dose 12/26). CHADSVASC 3 -Last outpatient INR 2.9 on 12/19; INR goal 2.5-3.5  -PTA dose 5mg  MWF, 7.5mg  aod  Today 09/04/15 the INR =  3.18, therapeutic after booster doses given  (10mg  on 1/1 and 1/2);  GOAL INR 2.5 - 3.5. -CBC: Hgb low/stable, PLTC decreased 164 >123>127k, no s/s of bleeding  Goal of Therapy:  INR 2.5 - 3.5 Monitor platelets by anticoagulation protocol: Yes   Plan: Plan per cardiologist is to discharge today 1/3 after ambulating. Cardiology discharging on PTA coumadin dose 7.5 mg qTTSS & 5 mg qMWF with oupt f/u of INR on 09/08/16 at [facility].   Allergies  Allergen Reactions  . Penicillins Other (See Comments)    REACTION: Hives Has patient had a PCN reaction causing immediate rash, facial/tongue/throat swelling, SOB or lightheadedness with hypotension: unsure Has patient had a PCN reaction causing severe rash involving mucus membranes or skin necrosis: unsure Has patient had a PCN reaction that required hospitalization unsure Has patient had a PCN reaction occurring within the last 10 years: yes If all of the above answers are "NO", then may proceed with Cephalosporin use.  . Adhesive [Tape] Other (See Comments)    Shin irritation  . Bactrim [Sulfamethoxazole-Trimethoprim] Other (See Comments)    CDIF    Patient Measurements: Height: 5\' 1"  (154.9 cm) Weight: 222 lb 3.2 oz (100.8 kg) IBW/kg (Calculated) : 52.3  Vital  Signs: Temp: 98.2 F (36.8 C) (01/03 0434) Temp Source: Oral (01/03 0434) BP: 116/54 (01/03 0434) Pulse Rate: 59 (01/03 0434)  Labs:  Recent Labs  09/01/16 0435 09/02/16 0420 09/03/16 0116  HGB  --  9.0* 9.2*  HCT  --  33.3* 33.4*  PLT  --  123* 127*  LABPROT 25.8* 26.9* 33.3*  INR 2.31 2.43 3.18  CREATININE 0.86  --  1.10    Estimated Creatinine Clearance: 77 mL/min (by C-G formula based on SCr of 1.1 mg/dL).    Thank you for allowing pharmacy to be part of this patients care team. Nicole Cella, RPh Clinical Pharmacist Pager: [phone]  09/03/2016 11:20 AM

## 2016-09-03 NOTE — Progress Notes (Addendum)
Noticed that pt's urine changed color from yellow to fresh bloody urine. Pt. asleep. Paged the doctor with recent lab results and notified about change in urine color. CBC, UA and urine culture verbally ordered. Will continue to monitor.   Jennica Tagliaferri, RN

## 2016-09-04 ENCOUNTER — Telehealth: Payer: Self-pay | Admitting: Physician Assistant

## 2016-09-04 ENCOUNTER — Telehealth: Payer: Self-pay | Admitting: Cardiology

## 2016-09-04 NOTE — Telephone Encounter (Signed)
Returned call to patient's wife.She stated husband has been having cramping in lower back and hands at times.She wanted to ask Dr.[name] if ok to take a extra potassium tablet when he is having cramps.Advised I will send message to Dr.[name] for advice.

## 2016-09-04 NOTE — Telephone Encounter (Signed)
     Wife called on call provider to ask if she could give Mr. Elk an extra potassium for severe cramping in legs and "kidneys". BMET yesterday showed normal K at 4.1. Discussed how this would not necessarily help him with normal potassium and that he may want to go to ER to be evaluated for severe LBP and other symptoms. Wife refused and adamant about trying potassium as this has worked in the past. I told her one extra would not hurt him and she was welcome to try this but I really felt like if he was in severe pain that he should go to ER. She again refused but appreciated my call. Asked me to have Dr. [name] call them this evening to check in on them. I told her I would be happy to route my message to him.  Angelena Form PA-C  [facility]

## 2016-09-04 NOTE — Telephone Encounter (Signed)
    Patient and wife called back again to report worsening LBP 'In kidneys" and abdominal pain around gallbladder (this was previously removed) as well as shaking and jerking all over. Kept on ruminating over the lasix and potassium dosing. I again emphasized that I felt like this was unrelated to his abdominal pain and tremors. I urged him to be seen in the ER.   Angelena Form PA-C  [facility]

## 2016-09-04 NOTE — Telephone Encounter (Signed)
Pt's Lasix was increased while he was in the hospital,it is doing good for him. She wants to know if she should increase his Potassium medicine,he is still having a little cramps?

## 2016-09-05 ENCOUNTER — Telehealth: Payer: Self-pay | Admitting: Cardiology

## 2016-09-05 ENCOUNTER — Encounter (HOSPITAL_COMMUNITY): Payer: Self-pay | Admitting: Emergency Medicine

## 2016-09-05 ENCOUNTER — Ambulatory Visit (HOSPITAL_COMMUNITY)
Admission: EM | Admit: 2016-09-05 | Discharge: 2016-09-05 | Disposition: A | Discharge status: Home / Self Care | Payer: Medicare HMO | Attending: Emergency Medicine | Admitting: Emergency Medicine

## 2016-09-05 DIAGNOSIS — N390 Urinary tract infection, site not specified: Secondary | ICD-10-CM | POA: Diagnosis not present

## 2016-09-05 DIAGNOSIS — T83511A Infection and inflammatory reaction due to indwelling urethral catheter, initial encounter: Secondary | ICD-10-CM

## 2016-09-05 LAB — POCT I-STAT, CHEM 8
BUN: 21 mg/dL — ABNORMAL HIGH (ref 6–20)
CALCIUM ION: 1.08 mmol/L — AB (ref 1.15–1.40)
CHLORIDE: 90 mmol/L — AB (ref 101–111)
Creatinine, Ser: 1.2 mg/dL (ref 0.61–1.24)
GLUCOSE: 97 mg/dL (ref 65–99)
HCT: 32 % — ABNORMAL LOW (ref 39.0–52.0)
Hemoglobin: 10.9 g/dL — ABNORMAL LOW (ref 13.0–17.0)
Potassium: 4.3 mmol/L (ref 3.5–5.1)
Sodium: 133 mmol/L — ABNORMAL LOW (ref 135–145)
TCO2: 35 mmol/L (ref 0–100)

## 2016-09-05 LAB — POCT URINALYSIS DIP (DEVICE)
BILIRUBIN URINE: NEGATIVE
Glucose, UA: NEGATIVE mg/dL
Ketones, ur: NEGATIVE mg/dL
Nitrite: NEGATIVE
PH: 8 (ref 5.0–8.0)
Protein, ur: 30 mg/dL — AB
Specific Gravity, Urine: 1.015 (ref 1.005–1.030)
Urobilinogen, UA: 1 mg/dL (ref 0.0–1.0)

## 2016-09-05 MED ORDER — CIPROFLOXACIN HCL 500 MG PO TABS: 500.0000 mg | ORAL_TABLET | Freq: Two times a day (BID) | ORAL | 0 refills | Status: AC

## 2016-09-05 NOTE — ED Triage Notes (Signed)
Here for UTI sx onset associated w/back pain  Seen here at [facility] ED and was called to come in to get antibiotics   Had foley cath taken out recently   Denies urinary freq, urgency, dysuria

## 2016-09-05 NOTE — Telephone Encounter (Signed)
The patient called last night with documentation noted elsewhere. He was having cramping. He wanted to take extra potassium. I did get a result and asked today which was positive urine but sensitivities were pending. I looked through medical records and could not see this is been treated. Call the patient today and spoke with him. His wife answered the phone and she was given information. He agrees to go to an urgent care today to have a repeat urinalysis. He's also been taking extra potassium I believe and so needs a basic metabolic profile. She understands he needs both the UA and check his potassium and they agree to comply with this. He can also be checked for other signs or symptoms of possible urinary tract infection given his pain. The patient understands that he needs to be seen tonight and again he agrees to comply.

## 2016-09-05 NOTE — ED Provider Notes (Signed)
HPI  SUBJECTIVE:  Lance Cook is a 56 y.o. male who presents with bilateral low back pain" by his kidneys" starting 2 weeks ago. He states is intermittent, lasting approximately 15 minutes. He states it is sharper than his usual back pain. He was recently admitted to the hospital for CHF exacerbation  where he had a Foley placed. He tried Tylenol, OxyContin 5 mg 3 times a day, Robaxin prescribed him for his chronic back pain, and a heating pad with some improvement in symptoms. There are no aggravating factors. He denies nausea, vomiting, fevers, abdominal pain. No testicular pain, scrotal swelling, penile rash, discharge. He states that he is currently not sexually active with his wife.  Patient has an extensive past medical history including MI, A. fib, TIA, CVA, diabetes, testicular cancer, CHF, aortic aneurysm and dissection, chronic low back pain, C. difficile. He has a remote history of UTI. No history of prostatitis, epididymitis,diabetes.  he was recently admitted to the hospital for acute CHF requiring IV diuresis on 12/27. Note today from cardiology requests BMP as patient has been taking extra potassium and a repeat UA.  UA 01/3 showed large hematuria. 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 PMD: Selinda Orion   Past Medical History:  Diagnosis Date  . Acute respiratory failure with hypoxemia ([place])   . Anemia   . Anxiety   . Aortic aneurysm ([place])   . Aortic aneurysm and dissection ([place])   . ARDS (adult respiratory distress syndrome) ([place]) 01/27/2015  . Arthritis   . Ascending aortic dissection ([place]) 07/14/2008   Localized dissection of ascending aorta noted on CTA in 2009 and stable on CTA in 2011  . Asymptomatic chronic venous hypertension 01/15/2010   Overview:  Overview:  Qualifier: Diagnosis of  By: [name] MD, [name]   Last Assessment & Plan:  I suggested he go to a vein clinic to see if he could be qualified compression  hose of some type, since he is not a surgical candidate according to the vascular surgeons.   . Atrial fibrillation ([place])    chronic persistent  . Bell's palsy   . CAD (coronary artery disease)    Old scar inferior wall myoview, 10/2009 EF 52%.  He did have previous SVG to RCA but no obstructive disease noted on his most recent cath.  SVG occluded.   . Cellulitis 04/02/2014  . Cerebral artery occlusion with cerebral infarction ([place]) 10/08/2011   Overview:  Overview:  And hx of TIA prior to CABG, all thought due to systemic emboli prior to coumadin   . CHF (congestive heart failure) ([place])   . Chronic diastolic congestive heart failure ([place])   . Chronic diastolic heart failure ([place]) 03/01/2015  . Chronic LBP 10/08/2011  . CVA (cerebral vascular accident) ([place]) 10/08/2011   And hx of TIA prior to CABG, all thought due to systemic emboli prior to coumadin   . Diabetes mellitus    diet controlled  . DIABETES MELLITUS, TYPE II 11/01/2009   Qualifier: Diagnosis of  By: [name] MD, [name]    . Diverticulosis   . Dysrhythmia    Dr. [name] follows-LOV 05-08-16 Epic.  . 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 Encephalopathy acute 02/05/2015  . Erectile dysfunction 10/08/2011  . GERD (gastroesophageal reflux disease)    not needing medication at thhis time- 01/22/15  . Gout   .  H/O mechanical aortic valve replacement 11/01/2009   Qualifier: Diagnosis of  By: [name], [name]    . Hiatal hernia   . History of adenomatous polyp of colon 12/23/2011   Colonoscopy January 2010, 6 mm rectal tubulovillous adenoma. No high-grade dysplasia   . History of colon polyps 12/23/2011   Overview:  Overview:  Colonoscopy January 2010, 6 mm rectal tubulovillous adenoma. No high-grade dysplasia   . Hyperlipidemia   . Hypertension   . Hypogonadism male 04/08/2012  . Impaired glucose tolerance  10/08/2011  . Leg pain 06/28/2010  . Morbid obesity ([place]) 04/02/2014  . Myocardial infarction age 42  . Obesity   . OSA (obstructive sleep apnea)    CPAP  . Peripheral vascular disease ([place])   . Reflux 11/06/09  . 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 [id number]   33 mm St Jude bileaflet mechanical valve placed via right mini thoracotomy approach  . 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    1988 - Dr Blase Mess at [facility] in [place], [place]  . Severe mitral regurgitation 10/11/2014  . Shortness of breath dyspnea    with exertion  . Stroke [facility]    TIA and Stroke no residual effect- 1st was 10  . Testicular cancer ([place])   . TIA (transient ischemic attack)    age 27  . Varicose veins     Past Surgical History:  Procedure Laterality Date  . aortic truck    . BENTALL PROCEDURE  1988   25 mm St Jude mechanical valve conduit - Dr Blase Mess at [facility] in [place], [place]  . CHOLECYSTECTOMY  2011  . COLONOSCOPY WITH PROPOFOL N/A 06/17/2016   Procedure: COLONOSCOPY WITH PROPOFOL;  Surgeon: Jerene Bears, MD;  Location: [facility] ENDOSCOPY;  Service: Gastroenterology;  Laterality: N/A;  . CORONARY ARTERY BYPASS GRAFT    . LASER ABLATION Left 03/06/2010   leg  . MITRAL VALVE REPLACEMENT Right 01/24/2015   Procedure: Re-Operation, MINIMALLY INVASIVE MITRAL VALVE (MV) REPLACEMENT.;  Surgeon: Rexene Alberts, MD;  Location: [place];  Service: Open Heart Surgery;  Laterality: Right;  . ORCHIECTOMY  age 40   testicular cancer  . OTOPLASTY     bilateral, age 33  . TEE WITHOUT CARDIOVERSION N/A 10/11/2014   Procedure: TRANSESOPHAGEAL ECHOCARDIOGRAM (TEE);  Surgeon: Pixie Casino, MD;  Location: [facility] ENDOSCOPY;  Service: Cardiovascular;  Laterality: N/A;  . TEE WITHOUT CARDIOVERSION N/A 01/24/2015   Procedure: TRANSESOPHAGEAL ECHOCARDIOGRAM (TEE);  Surgeon: Rexene Alberts,  MD;  Location: [place];  Service: Open Heart Surgery;  Laterality: N/A;  . TONSILLECTOMY  1967  . TOOTH EXTRACTION  2016    Family History  Problem Relation Age of Onset  . Heart disease Mother   . Throat cancer Mother   . Other Mother     bowel obstruction  . Diabetes Father   . Stroke Other   . Hypertension Other   . Cancer Maternal Aunt     lung, brain  . Cancer Maternal Uncle     brain aneurysm  . Colon cancer Neg Hx     Social History  Substance Use Topics  . Smoking status: Light Tobacco Smoker    Types: Cigars  . Smokeless tobacco: Never Used     Comment: about 3 yearly- cigar  . Alcohol use 0.0 oz/week     Comment: social    No  current facility-administered medications for this encounter.   Current Outpatient Prescriptions:  .  allopurinol (ZYLOPRIM) 300 MG tablet, Take 300 mg by mouth daily., Disp: , Rfl:  .  atenolol (TENORMIN) 100 MG tablet, Take 0.5 tablets (50 mg total) by mouth 2 (two) times daily., Disp: , Rfl:  .  digoxin (LANOXIN) 0.25 MG tablet, TAKE 1 TABLET EVERY DAY (Patient taking differently: Take 0.25 mg by mouth daily), Disp: 90 tablet, Rfl: 2 .  furosemide (LASIX) 80 MG tablet, Take 1 tablet (80 mg total) by mouth 2 (two) times daily., Disp: 60 tablet, Rfl: 3 .  magnesium oxide (MAG-OX) 400 (241.3 MG) MG tablet, Take 1 tablet (400 mg total) by mouth 2 (two) times daily., Disp: 180 tablet, Rfl: 3 .  methocarbamol (ROBAXIN) 500 MG tablet, Take 1 tablet (500 mg total) by mouth 3 (three) times daily., Disp: 90 tablet, Rfl: 2 .  potassium chloride (K-DUR) 10 MEQ tablet, Take 2 tablets (20 mEq total) by mouth 2 (two) times daily., Disp: 120 tablet, Rfl: 1 .  temazepam (RESTORIL) 15 MG capsule, Take 15 mg by mouth at bedtime as needed for sleep., Disp: , Rfl:  .  warfarin (COUMADIN) 5 MG tablet, TAKE 1 TO 1 AND 1/2 TABLETS DAILY AS DIRECTED BY COUMADIN CLINIC (Patient taking differently: Take 5 mg by mouth on Monday, Wednesday and Friday. Take 7.5 mg by  mouth on all other days), Disp: 135 tablet, Rfl: 0 .  acetaminophen (TYLENOL) 500 MG tablet, Take 500 mg by mouth every 8 (eight) hours as needed for moderate pain or headache. , Disp: , Rfl:  .  bacitracin 500 UNIT/GM ointment, Apply 1 application topically as needed for wound care. , Disp: , Rfl:  .  ciprofloxacin (CIPRO) 500 MG tablet, Take 1 tablet (500 mg total) by mouth 2 (two) times daily. X 5 days, Disp: 10 tablet, Rfl: 0 .  oxyCODONE (OXY IR/ROXICODONE) 5 MG immediate release tablet, Take 1 tablet (5 mg total) by mouth every 8 (eight) hours as needed for severe pain., Disp: 90 tablet, Rfl: 0  Allergies  Allergen Reactions  . Penicillins Other (See Comments)    REACTION: Hives Has patient had a PCN reaction causing immediate rash, facial/tongue/throat swelling, SOB or lightheadedness with hypotension: unsure Has patient had a PCN reaction causing severe rash involving mucus membranes or skin necrosis: unsure Has patient had a PCN reaction that required hospitalization unsure Has patient had a PCN reaction occurring within the last 10 years: yes If all of the above answers are "NO", then may proceed with Cephalosporin use.  . Adhesive [Tape] Other (See Comments)    Shin irritation  . Bactrim [Sulfamethoxazole-Trimethoprim] Other (See Comments)    CDIF     ROS  As noted in HPI.   Physical Exam  BP (!) 110/41 (BP Location: Left Arm)   Pulse 72   Temp 98.8 F (37.1 C) (Oral)   Resp 18   SpO2 97%   Constitutional: Well developed, well nourished, no acute distress Eyes:  EOMI, conjunctiva normal bilaterally HENT: Normocephalic, atraumatic,mucus membranes moist Respiratory: Normal inspiratory effort Cardiovascular: Normal rate GI: nondistended Soft, nontender.  No suprapubic or flank tenderness. Back: No CVA tenderness Rectal: Nontender, normal size prostate skin: No rash, skin intact Musculoskeletal: Positive extensive lower extremity edema. Neurologic: Alert & oriented  x 3, no focal neuro deficits Psychiatric: Speech and behavior appropriate   ED Course   Medications - No data to display  Orders Placed This Encounter  Procedures  .  POCT urinalysis dip (device)    Standing Status:   Standing    Number of Occurrences:   1  . I-STAT, chem 8    Standing Status:   Standing    Number of Occurrences:   1    Results for orders placed or performed during the hospital encounter of 09/05/16 (from the past 24 hour(s))  POCT urinalysis dip (device)     Status: Abnormal   Collection Time: 09/05/16  7:05 PM  Result Value Ref Range   Glucose, UA NEGATIVE NEGATIVE mg/dL   Bilirubin Urine NEGATIVE NEGATIVE   Ketones, ur NEGATIVE NEGATIVE mg/dL   Specific Gravity, Urine 1.015 1.005 - 1.030   Hgb urine dipstick TRACE (A) NEGATIVE   pH 8.0 5.0 - 8.0   Protein, ur 30 (A) NEGATIVE mg/dL   Urobilinogen, UA 1.0 0.0 - 1.0 mg/dL   Nitrite NEGATIVE NEGATIVE   Leukocytes, UA SMALL (A) NEGATIVE  I-STAT, chem 8     Status: Abnormal   Collection Time: 09/05/16  8:49 PM  Result Value Ref Range   Sodium 133 (L) 135 - 145 mmol/L   Potassium 4.3 3.5 - 5.1 mmol/L   Chloride 90 (L) 101 - 111 mmol/L   BUN 21 (H) 6 - 20 mg/dL   Creatinine, Ser 1.20 0.61 - 1.24 mg/dL   Glucose, Bld 97 65 - 99 mg/dL   Calcium, Ion 1.08 (L) 1.15 - 1.40 mmol/L   TCO2 35 0 - 100 mmol/L   Hemoglobin 10.9 (L) 13.0 - 17.0 g/dL   HCT 32.0 (L) 39.0 - 52.0 %   No results found.  ED Clinical Impression  Urinary tract infection associated with catheterization of urinary tract, unspecified indwelling urinary catheter type, initial encounter [facility])   ED Assessment/Plan  Previous records reviewed. As noted in history of present illness.  UA with trace hematuria and small leukocytes and proteinuria. Negative nitrite, ketones, glucosuria. The hematuria is improving. The leukocytes are new. The proteinuria has not changed.. Not repeating culture as he has a urine culture from 09/03/2016 showing 40,000  colonies of Morganella morganii with sensitivities pending.  today BUN and creatinine 1/1.20 elevated, compared to 15/1.10 2 days ago.   Discussed case with Michel Bickers, infectious disease Recommends Cipro 500 mg twice a day for 10 days, follow up on urine culture results to ensure sensitivities.  We'll have patient call his cardiologist or his PMD tomorrow for follow-up, and we'll also sent a note to both Mattie Marlin and Dr. [name]. Patient may need to back off on the Lasix given his kidney function, and also to discuss if he needs to have his warfarin adjusted because he is on the Cipro. States that he has a follow-up appointment with the Coumadin warfarin clinic on Monday.  Discussed labs, MDM, plan and followup with patient. Discussed sn/sx that should prompt return to the ED. Patient  agrees with plan.   Meds ordered this encounter  Medications  . ciprofloxacin (CIPRO) 500 MG tablet    Sig: Take 1 tablet (500 mg total) by mouth 2 (two) times daily. X 5 days    Dispense:  10 tablet    Refill:  0    *This clinic note was created using [employer]. Therefore, there may be occasional mistakes despite careful proofreading.  ?   Melynda Ripple, MD 09/05/16 [id number]

## 2016-09-06 LAB — URINE CULTURE

## 2016-09-08 ENCOUNTER — Ambulatory Visit (INDEPENDENT_AMBULATORY_CARE_PROVIDER_SITE_OTHER): Payer: Medicare HMO | Admitting: Internal Medicine

## 2016-09-08 ENCOUNTER — Telehealth: Payer: Self-pay | Admitting: Cardiology

## 2016-09-08 DIAGNOSIS — I482 Chronic atrial fibrillation, unspecified: Secondary | ICD-10-CM

## 2016-09-08 DIAGNOSIS — Z5181 Encounter for therapeutic drug level monitoring: Secondary | ICD-10-CM

## 2016-09-08 LAB — POCT INR: INR: 2.2

## 2016-09-08 NOTE — Telephone Encounter (Signed)
At pt's home,need to draw some lab work,please call asap.

## 2016-09-08 NOTE — Telephone Encounter (Signed)
Left message to call back  

## 2016-09-09 ENCOUNTER — Ambulatory Visit (INDEPENDENT_AMBULATORY_CARE_PROVIDER_SITE_OTHER): Payer: Medicare HMO | Admitting: Student

## 2016-09-09 ENCOUNTER — Encounter: Payer: Self-pay | Admitting: Student

## 2016-09-09 VITALS — BP 110/61 | HR 79 | Ht 61.5 in | Wt 225.8 lb

## 2016-09-09 DIAGNOSIS — R490 Dysphonia: Secondary | ICD-10-CM

## 2016-09-09 DIAGNOSIS — Z79899 Other long term (current) drug therapy: Secondary | ICD-10-CM | POA: Diagnosis not present

## 2016-09-09 DIAGNOSIS — I5032 Chronic diastolic (congestive) heart failure: Secondary | ICD-10-CM

## 2016-09-09 DIAGNOSIS — I872 Venous insufficiency (chronic) (peripheral): Secondary | ICD-10-CM

## 2016-09-09 DIAGNOSIS — I482 Chronic atrial fibrillation, unspecified: Secondary | ICD-10-CM

## 2016-09-09 DIAGNOSIS — I25810 Atherosclerosis of coronary artery bypass graft(s) without angina pectoris: Secondary | ICD-10-CM

## 2016-09-09 DIAGNOSIS — T83511D Infection and inflammatory reaction due to indwelling urethral catheter, subsequent encounter: Secondary | ICD-10-CM

## 2016-09-09 DIAGNOSIS — N390 Urinary tract infection, site not specified: Secondary | ICD-10-CM

## 2016-09-09 LAB — BASIC METABOLIC PANEL
BUN: 20 mg/dL (ref 7–25)
CHLORIDE: 102 mmol/L (ref 98–110)
CO2: 28 mmol/L (ref 20–31)
CREATININE: 0.99 mg/dL (ref 0.70–1.33)
Calcium: 7.8 mg/dL — ABNORMAL LOW (ref 8.6–10.3)
GLUCOSE: 71 mg/dL (ref 65–99)
Potassium: 4.2 mmol/L (ref 3.5–5.3)
Sodium: 142 mmol/L (ref 135–146)

## 2016-09-09 NOTE — Patient Instructions (Addendum)
Medication Instructions:  Your physician recommends that you continue on your current medications as directed. Please refer to the Current Medication list given to you today.  Labwork: Your physician recommends that you return for lab work in: TODAY BMET  Testing/Procedures: NONE   Follow-Up: Your physician recommends that you schedule a follow-up appointment in: [place]  Any Other Special Instructions Will Be Listed Below (If Applicable).

## 2016-09-09 NOTE — Progress Notes (Signed)
Cardiology Office Note    Date:  09/09/2016   ID:  Lance Cook, DOB 19-Jun-1961, MRN [id number]  PCP:  [name], PA-C  Cardiologist: Dr. [name]  Chief Complaint  Patient presents with  . Follow-up    pt c/o dizziness, and problem breathing    History of Present Illness:    Lance Cook is a 56 y.o. male with past medical history of AVR (s/p Bentall procedure in 1988), severe mitral regurgitation (s/p St Jude mechanical valve placed in 12/2014), CAD (s/p SVG-RCA, known to be occluded by cath in 12/2014), chronic atrial fibrillation, chronic diastolic CHF, prior CVA, chronic venous insufficiency, OSA, OSA (on CPAP) and recurrent C. difficile infection who presents to the office today for hospital follow-up.   Was last seen by myself on 08/27/2016 for worsening dyspnea, orthopnea and generalized edema. Weight was up to 258 lbs, up 32 lbs from his prior dry weight. With his significant volume overload, hospital admission was recommended for IV diuresis. He was diuresed with IV Lasix 80mg  Q8H for a majority of his hospitalization with a net output of over 21.0L. Weight was 222 lbs at time of discharge with a stable creatinine of 1.10. Discharged on Lasix 80mg  BID.  On the day prior to discharge he was noted to have hematuria with UA being positive for Hgb but with rare bacteria. Urology thought this was possibly secondary to trauma.  His culture resulted following hospital discharge and was positive for Morganella Morganii. He was seen at Urgent Care and started on Cipro 500mg  BID for 5 days.   In talking with the patient today, he reports doing well following recent hospitalization. Reports good compliance with Lasix 80 mg twice a day with weight fluctuating on his home scales between 221 lbs and 223 lbs. His dyspnea and orthopnea have significantly improved. Has yet to return to using his lymphedema compression machine and was strongly encouraged to do so.   He denies any chest discomfort  or palpitations. Has experienced hoarseness for the past 2 months which he initially thought was secondary to the volume overload but says this has not improved despite significant diuresis.    Past Medical History:  Diagnosis Date  . Acute respiratory failure with hypoxemia ([place])   . Anemia   . Anxiety   . Aortic aneurysm ([place])   . Aortic aneurysm and dissection ([place])   . ARDS (adult respiratory distress syndrome) ([place]) 01/27/2015  . Arthritis   . Ascending aortic dissection ([place]) 07/14/2008   Localized dissection of ascending aorta noted on CTA in 2009 and stable on CTA in 2011  . Asymptomatic chronic venous hypertension 01/15/2010   Overview:  Overview:  Qualifier: Diagnosis of  By: [name] MD, [name]   Last Assessment & Plan:  I suggested he go to a vein clinic to see if he could be qualified compression hose of some type, since he is not a surgical candidate according to the vascular surgeons.   . Atrial fibrillation ([place])    chronic persistent  . Bell's palsy   . CAD (coronary artery disease)    Old scar inferior wall myoview, 10/2009 EF 52%.  He did have previous SVG to RCA but no obstructive disease noted on his most recent cath.  SVG occluded.   . Cellulitis 04/02/2014  . Cerebral artery occlusion with cerebral infarction ([employer]) 10/08/2011   Overview:  Overview:  And hx of TIA prior to CABG, all thought due to systemic emboli prior to coumadin   .  CHF (congestive heart failure) ([place])   . Chronic diastolic congestive heart failure ([place])   . Chronic diastolic heart failure ([place]) 03/01/2015  . Chronic LBP 10/08/2011  . CVA (cerebral vascular accident) ([place]) 10/08/2011   And hx of TIA prior to CABG, all thought due to systemic emboli prior to coumadin   . Diabetes mellitus    diet controlled  . DIABETES MELLITUS, TYPE II 11/01/2009   Qualifier: Diagnosis of  By: [name] MD, [name]    . Diverticulosis   . Dysrhythmia    Dr. [name] follows-LOV 05-08-16 Epic.  . ED (erectile  dysfunction) of organic origin 10/08/2011   Overview:  Last Assessment & Plan:  S/p unilateral orchiectomy, for testosterone level, consider androgel pump 1.62    . Encephalopathy acute 02/05/2015  . Erectile dysfunction 10/08/2011  . GERD (gastroesophageal reflux disease)    not needing medication at thhis time- 01/22/15  . Gout   . H/O mechanical aortic valve replacement 11/01/2009   Qualifier: Diagnosis of  By: [name], [name]    . Hiatal hernia   . History of adenomatous polyp of colon 12/23/2011   Colonoscopy January 2010, 6 mm rectal tubulovillous adenoma. No high-grade dysplasia   . History of colon polyps 12/23/2011   Overview:  Overview:  Colonoscopy January 2010, 6 mm rectal tubulovillous adenoma. No high-grade dysplasia   . Hyperlipidemia   . Hypertension   . Hypogonadism male 04/08/2012  . Impaired glucose tolerance 10/08/2011  . Leg pain 06/28/2010  . Morbid obesity ([place]) 04/02/2014  . Myocardial infarction age 72  . Obesity   . OSA (obstructive sleep apnea)    CPAP  . Peripheral vascular disease ([place])   . Reflux 11/06/09  . 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 [id number]   33 mm St Jude bileaflet mechanical valve placed via right mini thoracotomy approach  . 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    1988 - Dr Blase Mess at [facility] in [place], [place]  . Severe mitral regurgitation 10/11/2014  . Shortness of breath dyspnea    with exertion  . Stroke [facility])    TIA and Stroke no residual effect- 1st was 65  . Testicular cancer ([place])   . TIA (transient ischemic attack)    age 13  . Varicose veins     Past Surgical History:  Procedure Laterality Date  . aortic truck    . BENTALL PROCEDURE  1988   25 mm St Jude mechanical valve conduit - Dr Blase Mess at [facility] in [place]  . CHOLECYSTECTOMY  2011  . COLONOSCOPY WITH PROPOFOL N/A 06/17/2016     Procedure: COLONOSCOPY WITH PROPOFOL;  Surgeon: Jerene Bears, MD;  Location: [facility];  Service: Gastroenterology;  Laterality: N/A;  . CORONARY ARTERY BYPASS GRAFT    . LASER ABLATION Left 03/06/2010   leg  . MITRAL VALVE REPLACEMENT Right 01/24/2015   Procedure: Re-Operation, MINIMALLY INVASIVE MITRAL VALVE (MV) REPLACEMENT.;  Surgeon: Rexene Alberts, MD;  Location: [place];  Service: Open Heart Surgery;  Laterality: Right;  . ORCHIECTOMY  age 26   testicular cancer  . OTOPLASTY     bilateral, age 75  . TEE WITHOUT CARDIOVERSION N/A 10/11/2014   Procedure: TRANSESOPHAGEAL ECHOCARDIOGRAM (TEE);  Surgeon: Pixie Casino, MD;  Location: [place];  Service: Cardiovascular;  Laterality: N/A;  . TEE WITHOUT CARDIOVERSION N/A 01/24/2015   Procedure:  TRANSESOPHAGEAL ECHOCARDIOGRAM (TEE);  Surgeon: Rexene Alberts, MD;  Location: [place];  Service: Open Heart Surgery;  Laterality: N/A;  . TONSILLECTOMY  1967  . TOOTH EXTRACTION  2016    Current Medications: Outpatient Medications Prior to Visit  Medication Sig Dispense Refill  . acetaminophen (TYLENOL) 500 MG tablet Take 500 mg by mouth every 8 (eight) hours as needed for moderate pain or headache.     . allopurinol (ZYLOPRIM) 300 MG tablet Take 300 mg by mouth daily.    Marland Kitchen atenolol (TENORMIN) 100 MG tablet Take 0.5 tablets (50 mg total) by mouth 2 (two) times daily.    . bacitracin 500 UNIT/GM ointment Apply 1 application topically as needed for wound care.     . ciprofloxacin (CIPRO) 500 MG tablet Take 1 tablet (500 mg total) by mouth 2 (two) times daily. X 5 days 10 tablet 0  . digoxin (LANOXIN) 0.25 MG tablet TAKE 1 TABLET EVERY DAY (Patient taking differently: Take 0.25 mg by mouth daily) 90 tablet 2  . furosemide (LASIX) 80 MG tablet Take 1 tablet (80 mg total) by mouth 2 (two) times daily. 60 tablet 3  . magnesium oxide (MAG-OX) 400 (241.3 MG) MG tablet Take 1 tablet (400 mg total) by mouth 2 (two) times daily. 180 tablet 3  .  methocarbamol (ROBAXIN) 500 MG tablet Take 1 tablet (500 mg total) by mouth 3 (three) times daily. 90 tablet 2  . potassium chloride (K-DUR) 10 MEQ tablet Take 2 tablets (20 mEq total) by mouth 2 (two) times daily. 120 tablet 1  . temazepam (RESTORIL) 15 MG capsule Take 15 mg by mouth at bedtime as needed for sleep.    Marland Kitchen warfarin (COUMADIN) 5 MG tablet TAKE 1 TO 1 AND 1/2 TABLETS DAILY AS DIRECTED BY COUMADIN CLINIC (Patient taking differently: Take 5 mg by mouth on Monday, Wednesday and Friday. Take 7.5 mg by mouth on all other days) 135 tablet 0  . oxyCODONE (OXY IR/ROXICODONE) 5 MG immediate release tablet Take 1 tablet (5 mg total) by mouth every 8 (eight) hours as needed for severe pain. 90 tablet 0   No facility-administered medications prior to visit.      Allergies:   Penicillins; Adhesive [tape]; and Bactrim [sulfamethoxazole-trimethoprim]   Social History   Social History  . Marital status: Married    Spouse name: N/A  . Number of children: 0  . Years of education: N/A   Occupational History  . Disabled Optician Unemployed   Social History Main Topics  . Smoking status: Light Tobacco Smoker    Types: Cigars  . Smokeless tobacco: Never Used     Comment: about 3 yearly- cigar  . Alcohol use 0.0 oz/week     Comment: social  . Drug use: No  . Sexual activity: Not on file   Other Topics Concern  . Not on file   Social History Narrative  . No narrative on file     Family History:  The patient's family history includes Cancer in his maternal aunt and maternal uncle; Diabetes in his father; Heart disease in his mother; Hypertension in his other; Other in his mother; Stroke in his other; Throat cancer in his mother.   Review of Systems:   Please see the history of present illness.     General:  No chills, fever, night sweats or weight changes. Positive for hoarseness.  Cardiovascular:  No chest pain, dyspnea on exertion, orthopnea, palpitations, paroxysmal nocturnal  dyspnea. Positive for lower extremity  edema.  Dermatological: No rash, lesions/masses Respiratory: No cough, dyspnea Urologic: No hematuria, dysuria Abdominal:   No nausea, vomiting, diarrhea, bright red blood per rectum, melena, or hematemesis Neurologic:  No visual changes, wkns, changes in mental status. All other systems reviewed and are otherwise negative except as noted above.   Physical Exam:    VS:  BP 110/61   Pulse 79   Ht 5' 1.5" (1.562 m)   Wt 225 lb 12.8 oz (102.4 kg)   BMI 41.97 kg/m    General: Well developed, overweight Caucasian male appearing in no acute distress. Head: Normocephalic, atraumatic, sclera non-icteric, no xanthomas, nares are without discharge.  Neck: No carotid bruits. JVD not elevated.  Lungs: Respirations regular and unlabored, without wheezes or rales.  Heart: Irregularly irregular. No S3 or S4.  No rubs or gallops appreciated. Crisp valve sounds appreciated.  Abdomen: Soft, non-tender, non-distended with normoactive bowel sounds. No hepatomegaly. No rebound/guarding. No obvious abdominal masses. Msk:  Strength and tone appear normal for age. No joint deformities or effusions. Extremities: No clubbing or cyanosis. Chronic 1+ pitting edema bilaterally, significantly improved when compared to prior examintaions. Distal pedal pulses are 2+ bilaterally. Neuro: Alert and oriented X 3. Moves all extremities spontaneously. No focal deficits noted. Psych:  Responds to questions appropriately with a normal affect. Skin: No rashes or lesions noted  Wt Readings from Last 3 Encounters:  09/09/16 225 lb 12.8 oz (102.4 kg)  09/03/16 222 lb 3.2 oz (100.8 kg)  08/27/16 258 lb (117 kg)     Studies/Labs Reviewed:   EKG:  EKG is not ordered today.    Recent Labs: 02/05/2016: ALT 19 08/27/2016: B Natriuretic Peptide 260.5 09/03/2016: Magnesium 1.8; Platelets 127 09/05/2016: BUN 21; Creatinine, Ser 1.20; Hemoglobin 10.9; Potassium 4.3; Sodium 133   Lipid Panel     Component Value Date/Time   CHOL 118 08/18/2012 1137   TRIG 133.0 08/18/2012 1137   HDL 27.20 (L) 08/18/2012 1137   CHOLHDL 4 08/18/2012 1137   VLDL 26.6 08/18/2012 1137   LDLCALC 64 08/18/2012 1137    Additional studies/ records that were reviewed today include:   Echocardiogram: 12/2016 Study Conclusions  - Left ventricle: The cavity size was normal. There was severe   concentric hypertrophy. Systolic function was vigorous. The   estimated ejection fraction was in the range of 65% to 70%. Wall   motion was normal; there were no regional wall motion   abnormalities. The study is not technically sufficient to allow   evaluation of LV diastolic function. - Aortic valve: A mechanical prosthesis was present and functioning   normally. Peak velocity (S): 345 cm/s. Mean gradient (S): 28 mm   Hg. (Consistent with moderate aortic stenosis gradient) - Aorta: Post Bentall with ST Jude Mechanical Valve in 1988. Aortic   arch diameter: 40 mm. - Aortic arch: The aortic arch was mildly dilated. - Mitral valve: A mechanical prosthesis was present and functioning   normally. - Left atrium: The atrium was severely dilated. - Right ventricle: The cavity size was mildly dilated. Wall   thickness was normal. - Right atrium: The atrium was moderately dilated. - Atrial septum: The septum bowed from left to right, consistent   with increased left atrial pressure. - Tricuspid valve: There was moderate regurgitation. - Pulmonary arteries: Systolic pressure was moderately to severely   increased. PA peak pressure: 67 mm Hg (S).  Impressions:  - There was no evidence of a vegetation.   Assessment:    1.  Chronic diastolic congestive heart failure ([place])   2. Chronic atrial fibrillation (H[facility])   3. Medication management   4. Venous insufficiency of both lower extremities   5. Coronary artery disease involving coronary bypass graft of native heart without angina pectoris   6. Urinary tract  infection associated with catheterization of urinary tract, unspecified indwelling urinary catheter type, subsequent encounter   7. Hoarseness of voice      Plan:   In order of problems listed above:  1. Chronic Diastolic CHF - recently admitted from 12/27 - 09/03/2016 for acute on chronic diastolic CHF as weight was up to 258 lbs. He diuresed over 21.0L with a weight of 222 lbs at time of discharge. - reports good compliance on Lasix 80mg  BID and concurrent K+ supplementation. Weights have been stable at home between 221 lbs and 223 lbs. Weight at 225 lbs on office scales today. - will recheck BMET today. Patient is anxious to reduce his Lasix dosing but with his weights remaining stable, hesitant to do so at this time unless creatinine is elevated.   2. Chronic Atrial Fibrillation  - This patients CHA2DS2-VASc Score and unadjusted Ischemic Stroke Rate (% per year) is equal to 3.2 % stroke rate/year from a score of 3 (CHF, HTN, Vascular). Continue Coumadin for anticoagulation. INR 2.2 on 1/8 and dosing adjusted by Coumadin clinic.  - continue BB.   3. Venous Insufficiency - followed by Vascular Clinic at [facility]. - Was previously unable to place his legs in the compression machine due to significant edema. Has yet to resume using his machine despite significant diuresis and was encouraged to do so beginning today.   4. CAD - s/p SVG-RCA, occluded by cath in 12/2014 - no recent anginal symptoms. Continue BB. Not on ASA secondary to need for Coumadin.   5. UTI - UA during recent admission was positive for Hgb but with rare bacteria. Urine culture was positive for Morganella Morganii. Was prescribed Cipro 500mg  BID for 5 days. Last dose is tomorrow. INR being followed closely by the Coumadin clinic.    6. Hoarseness - reports this has been on ongoing issue for the past 2 months - advised him to follow-up with his PCP, as referral to ENT may be indicated.   Medication Adjustments/Labs and  Tests Ordered: Current medicines are reviewed at length with the patient today.  Concerns regarding medicines are outlined above.  Medication changes, Labs and Tests ordered today are listed in the Patient Instructions below. Patient Instructions  Medication Instructions:  Your physician recommends that you continue on your current medications as directed. Please refer to the Current Medication list given to you today.  Labwork: Your physician recommends that you return for lab work in: TODAY BMET  Testing/Procedures: NONE   Follow-Up: Your physician recommends that you schedule a follow-up appointment in: [place]  Any Other Special Instructions Will Be Listed Below (If Applicable).    Signed, Erma Heritage, PA  09/09/2016 5:11 PM    [facility] [place], [place] [place], [place]  [address] Phone: [phone]; Fax: [phone]  [address], [place], [place] [address] Phone: [phone]

## 2016-09-10 NOTE — Telephone Encounter (Signed)
Pt saw brittany strader on 01/09

## 2016-09-12 NOTE — Telephone Encounter (Signed)
Encounter closed

## 2016-09-16 ENCOUNTER — Ambulatory Visit (INDEPENDENT_AMBULATORY_CARE_PROVIDER_SITE_OTHER): Payer: Medicare HMO | Admitting: Internal Medicine

## 2016-09-16 DIAGNOSIS — Z5181 Encounter for therapeutic drug level monitoring: Secondary | ICD-10-CM

## 2016-09-16 DIAGNOSIS — I482 Chronic atrial fibrillation, unspecified: Secondary | ICD-10-CM

## 2016-09-16 LAB — POCT INR: INR: 3.5

## 2016-09-23 ENCOUNTER — Ambulatory Visit (INDEPENDENT_AMBULATORY_CARE_PROVIDER_SITE_OTHER): Payer: Medicare HMO | Admitting: Interventional Cardiology

## 2016-09-23 ENCOUNTER — Telehealth: Payer: Self-pay | Admitting: Cardiology

## 2016-09-23 DIAGNOSIS — Z5181 Encounter for therapeutic drug level monitoring: Secondary | ICD-10-CM

## 2016-09-23 DIAGNOSIS — I482 Chronic atrial fibrillation, unspecified: Secondary | ICD-10-CM

## 2016-09-23 LAB — POCT INR: INR: 3.9

## 2016-09-23 NOTE — Telephone Encounter (Signed)
No orders to hold diuretic at this time.  We need to check a BMET.  Send results to me.

## 2016-09-23 NOTE — Telephone Encounter (Signed)
Spoke with Aimee with home health Patient is having cramping and dizziness and would like verbal order for BMET Currently taking Furosemide 80 mg twice a day and K+ 20 meq BID Girth has gone from 117 to 108, weight down from 226 to 212, and ankles down around 3 cm Blood pressure this am 126/60. Wife has taken blood pressure twice during dizzy spells and blood pressure 100/50 and 100/60 Verbal ok given for BMET Aimee would like to know if there could be parameters for holding/decreasing lasix secondary to lower blood pressures/decreased weight.  Will forward to Dr [name] for review

## 2016-09-23 NOTE — Telephone Encounter (Signed)
Spoke with Aimee she stated BMP was check/done today and a copy will be send to [place], also advised her not to hold diuretic at this time

## 2016-09-23 NOTE — Telephone Encounter (Signed)
New Message   Nurse wanted to see if she can get order for BMET./Jan8 pt weighed 226.4 lbs, and today pt weighed 212.4lbs complaining of some dizziness. Transferred to Triage.

## 2016-09-24 ENCOUNTER — Other Ambulatory Visit: Payer: Self-pay | Admitting: Cardiology

## 2016-09-24 MED ORDER — POTASSIUM CHLORIDE ER 10 MEQ PO TBCR: 20.0000 meq | EXTENDED_RELEASE_TABLET | Freq: Two times a day (BID) | ORAL | 1 refills | Status: DC

## 2016-09-24 NOTE — Telephone Encounter (Signed)
Rx(s) sent to pharmacy electronically.  

## 2016-09-24 NOTE — Telephone Encounter (Signed)
New Message     *STAT* If patient is at the pharmacy, call can be transferred to refill team.   1. Which medications need to be refilled? (please list name of each medication and dose if known)  potassium chloride (K-DUR) 10 MEQ tablet Take 2 tablets (20 mEq total) by mouth 2 (two) times daily.     2. Which pharmacy/location (including street and city if local pharmacy) is medication to be sent to? [facility] on [address]   3. Do they need a 30 day or 90 day supply? [place]

## 2016-09-29 ENCOUNTER — Telehealth: Payer: Self-pay | Admitting: Cardiology

## 2016-09-29 ENCOUNTER — Ambulatory Visit
Admission: RE | Admit: 2016-09-29 | Discharge: 2016-09-29 | Disposition: A | Discharge status: Home / Self Care | Payer: Medicare HMO | Source: Ambulatory Visit | Attending: Student | Admitting: Student

## 2016-09-29 DIAGNOSIS — R0689 Other abnormalities of breathing: Secondary | ICD-10-CM

## 2016-09-29 NOTE — Telephone Encounter (Signed)
Spoke to patient  difficulty of breath when awaking in the morning . , low energy, great difficulty moving .  Patient states he has a fluctuating weight 3- 5 lbs  patient states he use 80 mg furosemide twice  A day- he has not used any extra dose of the  Furosemide.  voice has not return since last hospitalization. No fever per patient . Patient ask if he was coming down with the flu.  Weight today 214.2  Lbs , yesterday  210.2 Patient will obtain blood pressure.

## 2016-09-29 NOTE — Telephone Encounter (Signed)
New Message  Pt voiced weight fluctuate, weak, having trouble breathing and he does feel that something is not right.  Pt voiced is wondering if he's coming down with the flu.  Please f/u with pt

## 2016-09-29 NOTE — Telephone Encounter (Signed)
Late entry  Reviewed information with B. Strader PA.  spoke to patient and wife .  Blood pressure early today  Was 117/68  informed patient go have chest xray done today or tomorrow.  wife states will have xray done today - needs appointment  She state will not take to hospital -   Appointment  Schedule for 09/30/16 at 10 am.     BAhmed Prima PA  Spoke to wife - results given concerning xray   per order given to wife for patient - take an extra 20 mg furosemide tonight along with 80 mg regular dose and extra 20 mg furosemide in the morning either before or after office visit tomorrow.

## 2016-09-29 NOTE — Progress Notes (Signed)
Cardiology Office Note    Date:  09/30/2016   ID:  Fordyce Elm, DOB February 13, 1961, MRN :[id number]  PCP:  [name], PA-C  Cardiologist: Dr. [name]  Chief Complaint  Patient presents with  . Follow-up    shortness of breath and fatigue    History of Present Illness:    Yediel Abood is a 56 y.o. male with past medical history of AVR (s/p Bentall procedure in 1988), severe mitral regurgitation (s/p St Jude mechanical valve placed in 12/2014), CAD (s/p SVG-RCA, known to be occluded by cath in 12/2014), chronic atrial fibrillation, chronic diastolic CHF (EF [id number] by echo in 12/2015), prior CVA, chronic venous insufficiency, OSA (on CPAP)and recurrent C. difficile infection who presents to the office today for evaluation of worsening dyspnea.   Hospitalized earlier this month for acute on chronic diastolic CHF. Diuresed over 21L with weight at 222 lbs on the day of discharge. Was seen by myself on 09/09/2016 and reported doing well following his recent hospitalization. Weight was stable at 225 lbs on office scales (221 - 223 lbs on home scales).  He was continued on Lasix 80mg  BID.  BMET checked on 1/23 with K+ 3.7 and creatinine 0.85.  He called the office on 1/29 reporting worsening dyspnea and fatigue. Weight fluctuating between 3-5 lbs pending his lymphedema. Denied any fever or chills but concerned about the Flu or possible PNA. Recommended to have CXR and this was obtained showing cardiomegaly with persistent pulmonary interstitial thickening, suspicious for venous congestion.  In talking with the patient today, he reports having worsening dyspnea at rest and with exertion since his last office visit. Denies any acute changes in this, just saying his symptoms have persisted. Has also felt very fatigued. He is not using his lymphedema machine due to this making him have cramping throughout his entire body. He reports having 2-3 episodes of chest discomfort occurring last week which was  worse with taking deep breaths and coughing. No association with exertion.  Weights have been stable on his home scales, between 210 and 214 lbs. he has experienced occasional episodes of dizziness. Notes his heart rate is in the low 50s at times. Blood pressure is usually in 110s to 130s over 60s to 80s.    Past Medical History:  Diagnosis Date  . Acute respiratory failure with hypoxemia ([place])   . Anemia   . Anxiety   . Aortic aneurysm ([place])   . Aortic aneurysm and dissection ([place])   . ARDS (adult respiratory distress syndrome) ([place]) 01/27/2015  . Arthritis   . Ascending aortic dissection ([place]) 07/14/2008   Localized dissection of ascending aorta noted on CTA in 2009 and stable on CTA in 2011  . Asymptomatic chronic venous hypertension 01/15/2010   Overview:  Overview:  Qualifier: Diagnosis of  By: [name] MD, [name]   Last Assessment & Plan:  I suggested he go to a vein clinic to see if he could be qualified compression hose of some type, since he is not a surgical candidate according to the vascular surgeons.   . Atrial fibrillation ([place])    chronic persistent  . Bell's palsy   . CAD (coronary artery disease)    Old scar inferior wall myoview, 10/2009 EF 52%.  He did have previous SVG to RCA but no obstructive disease noted on his most recent cath.  SVG occluded.   . Cellulitis 04/02/2014  . Cerebral artery occlusion with cerebral infarction ([place]) 10/08/2011   Overview:  Overview:  And hx  of TIA prior to CABG, all thought due to systemic emboli prior to coumadin   . CHF (congestive heart failure) ([place])   . Chronic diastolic congestive heart failure ([place])   . Chronic diastolic heart failure ([place]) 03/01/2015  . Chronic LBP 10/08/2011  . CVA (cerebral vascular accident) ([place]) 10/08/2011   And hx of TIA prior to CABG, all thought due to systemic emboli prior to coumadin   . Diabetes mellitus    diet controlled  . DIABETES MELLITUS, TYPE II 11/01/2009   Qualifier: Diagnosis of  By: [name]  MD, [name]    . Diverticulosis   . Dysrhythmia    Dr. [name] follows-LOV 05-08-16 Epic.  . 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 Encephalopathy acute 02/05/2015  . Erectile dysfunction 10/08/2011  . GERD (gastroesophageal reflux disease)    not needing medication at thhis time- 01/22/15  . Gout   . H/O mechanical aortic valve replacement 11/01/2009   Qualifier: Diagnosis of  By: [name], [name]    . Hiatal hernia   . History of adenomatous polyp of colon 12/23/2011   Colonoscopy January 2010, 6 mm rectal tubulovillous adenoma. No high-grade dysplasia   . History of colon polyps 12/23/2011   Overview:  Overview:  Colonoscopy January 2010, 6 mm rectal tubulovillous adenoma. No high-grade dysplasia   . Hyperlipidemia   . Hypertension   . Hypogonadism male 04/08/2012  . Impaired glucose tolerance 10/08/2011  . Leg pain 06/28/2010  . Morbid obesity ([place]) 04/02/2014  . Myocardial infarction age 38  . Obesity   . OSA (obstructive sleep apnea)    CPAP  . Peripheral vascular disease ([place])   . Reflux 11/06/09  . 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 [id number]   33 mm St Jude bileaflet mechanical valve placed via right mini thoracotomy approach  . 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    1988 - Dr Blase Mess at [facility] in [place], [place]  . Severe mitral regurgitation 10/11/2014  . Shortness of breath dyspnea    with exertion  . Stroke [facility])    TIA and Stroke no residual effect- 1st was 75  . Testicular cancer ([place])   . TIA (transient ischemic attack)    age 79  . Varicose veins     Past Surgical History:  Procedure Laterality Date  . aortic truck    . BENTALL PROCEDURE  1988   25 mm St Jude mechanical valve conduit - Dr Blase Mess at [facility] in [place], [place]  . CHOLECYSTECTOMY  2011  . COLONOSCOPY WITH PROPOFOL N/A 06/17/2016   Procedure: COLONOSCOPY WITH PROPOFOL;  Surgeon: Jerene Bears, MD;  Location: [facility] ENDOSCOPY;  Service: Gastroenterology;  Laterality: N/A;  . CORONARY ARTERY BYPASS GRAFT    . LASER ABLATION Left 03/06/2010   leg  . MITRAL VALVE REPLACEMENT Right 01/24/2015   Procedure: Re-Operation, MINIMALLY INVASIVE MITRAL VALVE (MV) REPLACEMENT.;  Surgeon: Rexene Alberts, MD;  Location: [place];  Service: Open Heart Surgery;  Laterality: Right;  . ORCHIECTOMY  age 9   testicular cancer  . OTOPLASTY     bilateral, age 81  . TEE WITHOUT CARDIOVERSION N/A 10/11/2014   Procedure: TRANSESOPHAGEAL ECHOCARDIOGRAM (TEE);  Surgeon: Pixie Casino, MD;  Location: [facility] ENDOSCOPY;  Service: Cardiovascular;  Laterality: N/A;  . TEE WITHOUT CARDIOVERSION N/A 01/24/2015   Procedure: TRANSESOPHAGEAL ECHOCARDIOGRAM (TEE);  Surgeon: Rexene Alberts, MD;  Location: [place];  Service: Open Heart Surgery;  Laterality: N/A;  . TONSILLECTOMY  1967  . TOOTH EXTRACTION  2016    Current Medications: Outpatient Medications Prior to Visit  Medication Sig Dispense Refill  . acetaminophen (TYLENOL) 500 MG tablet Take 500 mg by mouth every 8 (eight) hours as needed for moderate pain or headache.     . allopurinol (ZYLOPRIM) 300 MG tablet Take 300 mg by mouth daily.    . bacitracin 500 UNIT/GM ointment Apply 1 application topically as needed for wound care.     . magnesium oxide (MAG-OX) 400 (241.3 MG) MG tablet Take 1 tablet (400 mg total) by mouth 2 (two) times daily. 180 tablet 3  . methocarbamol (ROBAXIN) 500 MG tablet Take 1 tablet (500 mg total) by mouth 3 (three) times daily. 90 tablet 2  . potassium chloride (K-DUR) 10 MEQ tablet Take 2 tablets (20 mEq total) by mouth 2 (two) times daily. 360 tablet 1  . temazepam (RESTORIL) 15 MG capsule Take 15 mg by mouth at bedtime as needed for sleep.    Marland Kitchen warfarin (COUMADIN) 5 MG  tablet TAKE 1 TO 1 AND 1/2 TABLETS DAILY AS DIRECTED BY COUMADIN CLINIC (Patient taking differently: Take 5 mg by mouth on Monday, Wednesday and Friday. Take 7.5 mg by mouth on all other days) 135 tablet 0  . atenolol (TENORMIN) 100 MG tablet Take 0.5 tablets (50 mg total) by mouth 2 (two) times daily.    . furosemide (LASIX) 80 MG tablet Take 1 tablet (80 mg total) by mouth 2 (two) times daily. 60 tablet 3  . oxyCODONE (OXY IR/ROXICODONE) 5 MG immediate release tablet Take 1 tablet (5 mg total) by mouth every 8 (eight) hours as needed for severe pain. 90 tablet 0  . digoxin (LANOXIN) 0.25 MG tablet TAKE 1 TABLET EVERY DAY (Patient taking differently: Take 0.25 mg by mouth daily) 90 tablet 2   No facility-administered medications prior to visit.      Allergies:   Penicillins; Adhesive [tape]; and Bactrim [sulfamethoxazole-trimethoprim]   Social History   Social History  . Marital status: Married    Spouse name: N/A  . Number of children: 0  . Years of education: N/A   Occupational History  . Disabled Optician Unemployed   Social History Main Topics  . Smoking status: Light Tobacco Smoker    Types: Cigars  . Smokeless tobacco: Never Used     Comment: about 3 yearly- cigar  . Alcohol use 0.0 oz/week     Comment: social  . Drug use: No  . Sexual activity: Not Asked   Other Topics Concern  . None   Social History Narrative  . None     Family History:  The patient's family history includes Cancer in his maternal aunt and maternal uncle; Diabetes in his father; Heart disease in his mother; Hypertension in his other; Other in his mother; Stroke in his other; Throat cancer in his mother.   Review of Systems:   Please see the history of present illness.     General:  No chills, fever, night sweats or weight changes. Positive for fatigue. Cardiovascular:  No orthopnea, palpitations, paroxysmal nocturnal dyspnea. Positive for chest pain and dyspnea on exertion.  Dermatological: No  rash, lesions/masses Respiratory: No cough, dyspnea Urologic: No hematuria, dysuria Abdominal:   No nausea,  vomiting, diarrhea, bright red blood per rectum, melena, or hematemesis Neurologic:  No visual changes, wkns, changes in mental status. All other systems reviewed and are otherwise negative except as noted above.   Physical Exam:    VS:  BP 117/77   Pulse 73   Ht 5' 1.5" (1.562 m)   Wt 212 lb 6.4 oz (96.3 kg)   BMI 39.48 kg/m    General: Well developed, well nourished,male appearing in no acute distress. Head: Normocephalic, atraumatic, sclera non-icteric, no xanthomas, nares are without discharge.  Neck: No carotid bruits. JVD not elevated.  Lungs: Respirations regular and unlabored, without wheezes or rales.  Heart: Irregularly irregular. No S3 or S4.  No murmur, no rubs, or gallops appreciated. Crisp valve sounds appreciated. Abdomen: Soft, non-tender, non-distended with normoactive bowel sounds. No hepatomegaly. No rebound/guarding. No obvious abdominal masses. Msk:  Strength and tone appear normal for age. No joint deformities or effusions. Extremities: No clubbing or cyanosis. Chronic edema but significant improved compared to prior examinations.  Distal pedal pulses are 2+ bilaterally. Neuro: Alert and oriented X 3. Moves all extremities spontaneously. No focal deficits noted. Psych:  Responds to questions appropriately with a normal affect. Skin: No rashes or lesions noted  Wt Readings from Last 3 Encounters:  09/30/16 212 lb 6.4 oz (96.3 kg)  09/09/16 225 lb 12.8 oz (102.4 kg)  09/03/16 222 lb 3.2 oz (100.8 kg)      Studies/Labs Reviewed:   EKG:  EKG is ordered today.  The ekg ordered today demonstrates atrial fibrillation, HR 65, with LAD and TWI AVL (similar to prior tracings).   Recent Labs: 02/05/2016: ALT 19 08/27/2016: B Natriuretic Peptide 260.5 09/03/2016: Magnesium 1.8; Platelets 127 09/05/2016: Hemoglobin 10.9 09/09/2016: BUN 20; Creat 0.99; Potassium 4.2;  Sodium 142   Lipid Panel    Component Value Date/Time   CHOL 118 08/18/2012 1137   TRIG 133.0 08/18/2012 1137   HDL 27.20 (L) 08/18/2012 1137   CHOLHDL 4 08/18/2012 1137   VLDL 26.6 08/18/2012 1137   LDLCALC 64 08/18/2012 1137    Additional studies/ records that were reviewed today include:   Echocardiogram: 12/2015 Study Conclusions  - Left ventricle: The cavity size was normal. There was severe   concentric hypertrophy. Systolic function was vigorous. The   estimated ejection fraction was in the range of 65% to 70%. Wall   motion was normal; there were no regional wall motion   abnormalities. The study is not technically sufficient to allow   evaluation of LV diastolic function. - Aortic valve: A mechanical prosthesis was present and functioning   normally. Peak velocity (S): 345 cm/s. Mean gradient (S): 28 mm   Hg. (Consistent with moderate aortic stenosis gradient) - Aorta: Post Bentall with ST Jude Mechanical Valve in 1988. Aortic   arch diameter: 40 mm. - Aortic arch: The aortic arch was mildly dilated. - Mitral valve: A mechanical prosthesis was present and functioning   normally. - Left atrium: The atrium was severely dilated. - Right ventricle: The cavity size was mildly dilated. Wall   thickness was normal. - Right atrium: The atrium was moderately dilated. - Atrial septum: The septum bowed from left to right, consistent   with increased left atrial pressure. - Tricuspid valve: There was moderate regurgitation. - Pulmonary arteries: Systolic pressure was moderately to severely   increased. PA peak pressure: 67 mm Hg (S).  Impressions:  - There was no evidence of a vegetation.   Assessment:    1. Chronic  diastolic congestive heart failure ([place])   2. SOB (shortness of breath)   3. Atypical chest pain   4. Coronary artery disease involving coronary bypass graft of native heart without angina pectoris   5. Hoarseness of voice   6. Essential hypertension    7. Medication management      Plan:   In order of problems listed above:  1. Chronic Diastolic CHF/ Dyspnea  - reports consistent dyspnea but worsening fatigue since hospital discharge. No fever or chills. Weights have been stable between 210-214 lbs on home scales. Continues to decline on office scales (225 lbs --> 212 lbs today). Recent CXR showed cardiomegaly with persistent pulmonary interstitial thickening. - he reports feeling "drained" and is concerned about over-diuresis as his weight continues to decline. This is the lowest he has weighed in over 1 year. This is a difficult situation, as he still reports dyspnea but lungs are clear on examination. Check BMET and BNP (will obtain with Home Health RN on Wednesday per patient's wishes). We discussed trying to cut down on Lasix from 80mg  BID to 80mg  in AM and 40mg  in PM over the next several days. If weight increases overnight, he is to go back to 80mg  BID.   2. Atypical Chest Pain - worse with coughing and deep inspiration over the past week. No exertional component reported. - EKG today is without acute ischemic changes.  - atypical for a cardiac etiology.  3. CAD - s/p SVG-RCA, known to be occluded by cath in 12/2014. Native arteries patent by cath at that time. - continue BB. No ASA secondary to need for Coumadin.  4. Hoarseness - he has brought this up during recent office visits. Was encouraged to follow-up with his PCP, as he may benefit from an ENT referral. - has appointment with ENT later today.  5. Essential HTN - BP well-controlled at 117/77 during today's visit. He does report more frequent episodes of dizziness at home, HR in the low-50's at times. Will decrease Atenolol from 50mg  BID to 25mg  BID   Medication Adjustments/Labs and Tests Ordered: Current medicines are reviewed at length with the patient today.  Concerns regarding medicines are outlined above.  Medication changes, Labs and Tests ordered today are  listed in the Patient Instructions below. Patient Instructions  Medication Instructions:  DECREASE atenolol to 25 mg two times a day.  DECREASE lasix to 80mg  in the AM and 40 mg in the PM.  You may increase to 80 mg in the AM and 80 mg in the PM if needed for weight gain.  Labwork: Please have home health draw blood tomorrow (BMET, BNP)  Testing/Procedures: NONE  Follow-Up: Keep follow up with Dr. [name] in 2 weeks.    If you need a refill on your cardiac medications before your next appointment, please call your pharmacy.   Signed, Erma Heritage, [place]  09/30/2016 6:36 PM    [facility] [place], [place], [place]  [address] Phone: [phone]; Fax: [phone]  [address], [place], [place] [address] Phone: [phone]

## 2016-09-30 ENCOUNTER — Encounter: Payer: Self-pay | Admitting: Student

## 2016-09-30 ENCOUNTER — Other Ambulatory Visit: Payer: Self-pay | Admitting: Otolaryngology

## 2016-09-30 ENCOUNTER — Ambulatory Visit (INDEPENDENT_AMBULATORY_CARE_PROVIDER_SITE_OTHER): Payer: Medicare HMO | Admitting: Student

## 2016-09-30 VITALS — BP 117/77 | HR 73 | Ht 61.5 in | Wt 212.4 lb

## 2016-09-30 DIAGNOSIS — I25810 Atherosclerosis of coronary artery bypass graft(s) without angina pectoris: Secondary | ICD-10-CM

## 2016-09-30 DIAGNOSIS — R490 Dysphonia: Secondary | ICD-10-CM

## 2016-09-30 DIAGNOSIS — Z79899 Other long term (current) drug therapy: Secondary | ICD-10-CM

## 2016-09-30 DIAGNOSIS — R0602 Shortness of breath: Secondary | ICD-10-CM

## 2016-09-30 DIAGNOSIS — R0789 Other chest pain: Secondary | ICD-10-CM

## 2016-09-30 DIAGNOSIS — I5032 Chronic diastolic (congestive) heart failure: Secondary | ICD-10-CM

## 2016-09-30 DIAGNOSIS — J3800 Paralysis of vocal cords and larynx, unspecified: Secondary | ICD-10-CM

## 2016-09-30 DIAGNOSIS — I10 Essential (primary) hypertension: Secondary | ICD-10-CM

## 2016-09-30 MED ORDER — FUROSEMIDE 80 MG PO TABS: ORAL_TABLET | ORAL | 3 refills | Status: DC

## 2016-09-30 MED ORDER — ATENOLOL 25 MG PO TABS: 25.0000 mg | ORAL_TABLET | Freq: Two times a day (BID) | ORAL | Status: DC

## 2016-09-30 NOTE — Patient Instructions (Signed)
Medication Instructions:  DECREASE atenolol to 25 mg two times a day.  DECREASE lasix to 80mg  in the AM and 40 mg in the PM.  You may increase to 80 mg in the AM and 80 mg in the PM if needed for weight gain.  Labwork: Please have home health draw blood tomorrow (BMET, BNP)  Testing/Procedures: NONE  Follow-Up: Keep follow up with Dr. [name] in 2 weeks.      If you need a refill on your cardiac medications before your next appointment, please call your pharmacy.

## 2016-10-02 ENCOUNTER — Ambulatory Visit (INDEPENDENT_AMBULATORY_CARE_PROVIDER_SITE_OTHER): Payer: Medicare HMO | Admitting: Cardiovascular Disease

## 2016-10-02 DIAGNOSIS — I482 Chronic atrial fibrillation, unspecified: Secondary | ICD-10-CM

## 2016-10-02 DIAGNOSIS — Z5181 Encounter for therapeutic drug level monitoring: Secondary | ICD-10-CM

## 2016-10-02 LAB — POCT INR: INR: 2.6

## 2016-10-06 ENCOUNTER — Ambulatory Visit
Admission: RE | Admit: 2016-10-06 | Discharge: 2016-10-06 | Disposition: A | Discharge status: Home / Self Care | Payer: Medicare HMO | Source: Ambulatory Visit | Attending: Otolaryngology | Admitting: Otolaryngology

## 2016-10-06 DIAGNOSIS — R590 Localized enlarged lymph nodes: Secondary | ICD-10-CM | POA: Diagnosis not present

## 2016-10-06 DIAGNOSIS — J3801 Paralysis of vocal cords and larynx, unilateral: Secondary | ICD-10-CM | POA: Diagnosis present

## 2016-10-06 DIAGNOSIS — Z952 Presence of prosthetic heart valve: Secondary | ICD-10-CM | POA: Diagnosis not present

## 2016-10-06 DIAGNOSIS — E042 Nontoxic multinodular goiter: Secondary | ICD-10-CM | POA: Insufficient documentation

## 2016-10-06 DIAGNOSIS — J3800 Paralysis of vocal cords and larynx, unspecified: Secondary | ICD-10-CM

## 2016-10-06 MED ORDER — IOPAMIDOL (ISOVUE-300) INJECTION 61%
75.0000 mL | Freq: Once | INTRAVENOUS | Status: AC | PRN
  Administered 2016-10-06: 75 mL via INTRAVENOUS

## 2016-10-06 NOTE — Progress Notes (Signed)
Patient's Name: Lance Cook  MRN: [id number]  Referring Provider: [name], PA-C  DOB: 1960/12/10  PCP: [name], PA-C  DOS: 10/07/2016  Note by: Kathlen Brunswick. Dossie Arbour, MD  Service setting: Ambulatory outpatient  Specialty: Interventional Pain Management  Location: [facility] (AMB) Pain Management Facility    Patient type: Established   Primary Reason(s) for Visit: Encounter for prescription drug management (Level of risk: moderate) CC: Back Pain (lower middle); Leg Pain (bilateral); and Sore Throat (possible complication of ophen heart surgery, possible cancerous lesion.)  HPI  Lance Cook is a 56 y.o. year old, male patient, who comes today for a medication management evaluation. 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; Ascending aortic dissection ([place]); Chronic diastolic congestive heart failure (HCC); 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; Enteritis due to Clostridium difficile; Hypomagnesemia; Physical deconditioning; Long term current use of opiate analgesic; Long term prescription opiate use; Opiate use (22.5 MME/day); Opiate dependence ([place]); Encounter for therapeutic drug level monitoring; Chronic low back pain (midline); Lumbar facet syndrome; Personal history of transient ischemic attack (TIA), and cerebral infarction without residual deficits; ED (erectile dysfunction) of organic origin; Eunuchoidism;  Adult BMI 30+; Obstructive apnea; Testicular hypofunction; Lumbar canal stenosis; Malignant neoplasm of testis ([place]); Chronic lower extremity pain (Left); Personal history of malignant neoplasm of testis; Musculoskeletal pain; History of colonic polyps; Chronic diarrhea; Benign neoplasm of transverse colon; Scrotal swelling; Chronic pain syndrome; Acute on chronic diastolic CHF (congestive heart failure) (HCC); S/P MVR (mitral valve replacement); H/O TIA (transient ischemic attack) and stroke; Heart valve replaced by other means; HTN (hypertension); Low back pain; Lymphadenopathy; Male hypogonadism; Obesity (BMI 30-39.9); Obstructive sleep apnea; Other testicular hypofunction; Presence of other heart-valve replacement; S/P AVR (aortic valve replacement); Spinal stenosis of lumbar region without neurogenic claudication; Spondylosis; Type 2 diabetes mellitus with other specified complication ([place]); and Type II or unspecified type diabetes mellitus with other specified manifestations, not stated as uncontrolled on his problem list. His primarily concern today is the Back Pain (lower middle); Leg Pain (bilateral); and Sore Throat (possible complication of ophen heart surgery, possible cancerous lesion.)  Pain Assessment: Self-Reported Pain Score: 6  (pain scale reviewed. )/10 Clinically the patient looks like a 1/10 Reported level is inconsistent with clinical observations. Information on the proper use of the pain scale provided to the patient today Pain Type: Chronic pain Pain Location: Back Pain Orientation: Lower, Mid (leg pain and sore throat, MRI of throat taken yesterday. ) Pain Descriptors / Indicators: Pressure, Constant Pain Frequency: Constant  Lance Cook was last seen on 07/28/2016 for medication management. During today's appointment we reviewed Lance Cook's chronic pain status, as well as his outpatient medication regimen. The patient is currently being evaluated for some mediastinal lymph  nodes.  The patient  reports that he does not use drugs. His body mass index is 39.41 kg/m.  Further details on both, my assessment(s), as well as the proposed treatment plan, please see below.  Controlled Substance Pharmacotherapy Assessment REMS (Risk Evaluation and Mitigation Strategy)  Analgesic:Oxycodone IR 72m q8hrs (15 mg/day of oxycodone)  MME/day:22.5 mg/day [name], RN  10/07/2016 12:12 PM  Sign at close encounter Nursing Pain Medication Assessment:  Safety precautions to be maintained throughout the outpatient stay will include: orient to surroundings, keep bed in low position, maintain call bell within reach at all times, provide assistance with transfer out of bed and ambulation.  Medication Inspection Compliance: Pill count conducted under aseptic conditions, in front of the patient. Neither the pills nor the bottle was removed from the patient's sight at any time. Once count was completed pills were immediately returned to the patient in their original bottle.  Medication: See above Pill/Patch Count: 80 of 90 pills remain Bottle Appearance: Standard pharmacy container. Clearly labeled. Filled Date: 01 / 25 /2018 Last Medication intake: today    Pharmacokinetics: Liberation and absorption (onset of action): WNL Distribution (time to peak effect): WNL Metabolism and excretion (duration of action): WNL         Pharmacodynamics: Desired effects: Analgesia: Lance Cook reports >50% benefit. Functional ability: Patient reports that medication allows him to accomplish basic ADLs Clinically meaningful improvement in function (CMIF): Sustained CMIF goals met Perceived effectiveness: Described as relatively effective, allowing for increase in activities of daily living (ADL) Undesirable effects: Side-effects or Adverse reactions: None reported Monitoring: [place] PMP: Online review of the past 67-monthperiod conducted. Compliant with practice rules and regulations List  of all UDS test(s) done:  Lab Results  Component Value Date   TOXASSSELUR FINAL 01/07/2016   TOXASSSELUR FINAL 10/09/2015   TOXASSSELUR FINAL 07/11/2015   Last UDS on record: ToxAssure Select 13  Date Value Ref Range Status  01/07/2016 FINAL  Final    Comment:    ==================================================================== TOXASSURE SELECT 13 (MW) ==================================================================== Test                             Result       Flag       Units Drug Present and Declared for Prescription Verification   Oxazepam                       782          EXPECTED   ng/mg creat   Temazepam                      10809        EXPECTED   ng/mg creat    Oxazepam and temazepam are expected metabolites of diazepam.    Oxazepam is also an expected metabolite of other benzodiazepine    drugs, including chlordiazepoxide, prazepam, clorazepate,    halazepam, and temazepam.  Oxazepam and temazepam are available    as scheduled prescription medications.   Oxycodone                      1568         EXPECTED   ng/mg creat   Oxymorphone                    305          EXPECTED   ng/mg creat   Noroxycodone                   5650         EXPECTED   ng/mg creat    Sources of oxycodone include scheduled prescription medications.    Oxymorphone and noroxycodone are expected metabolites  of    oxycodone. Oxymorphone is also available as a scheduled    prescription medication. ==================================================================== Test                      Result    Flag   Units      Ref Range   Creatinine              22               mg/dL      >=20 ==================================================================== Declared Medications:  The flagging and interpretation on this report are based on the  following declared medications.  Unexpected results may arise from  inaccuracies in the declared medications.  **Note: The testing scope of this panel  includes these medications:  Oxycodone (Oxy-IR)  Temazepam (Restoril)  **Note: The testing scope of this panel does not include following  reported medications:  Allopurinol (Zyloprim)  Atenolol (Tenormin)  Calcium (Calcium/Magnesium/Zinc)  Clindamycin (Cleocin)  Furosemide (Lasix)  Magnesium (Calcium/Magnesium/Zinc)  Magnesium (Mag-Ox)  Methocarbamol (Robaxin)  Potassium (K-Dur)  Topical  Topical (Silvadene)  Vitamin B  Warfarin (Coumadin)  Zinc (Calcium/Magnesium/Zinc) ==================================================================== For clinical consultation, please call [phone]. ====================================================================    UDS interpretation: Compliant          Medication Assessment Form: Reviewed. Patient indicates being compliant with therapy Treatment compliance: Compliant Risk Assessment Profile: Aberrant behavior: See prior evaluations. None observed or detected today Comorbid factors increasing risk of overdose: See prior notes. No additional risks detected today Risk of substance use disorder (SUD): Low Opioid Risk Tool (ORT) Total Score: 0  Interpretation Table:  Score <3 = Low Risk for SUD  Score between 4-7 = Moderate Risk for SUD  Score >8 = High Risk for Opioid Abuse   Risk Mitigation Strategies:  Patient Counseling: Covered Patient-Prescriber Agreement (PPA): Present and active  Notification to other healthcare providers: Done  Pharmacologic Plan: No change in therapy, at this time  Laboratory Chemistry  Inflammation Markers Lab Results  Component Value Date   ESRSEDRATE 9 01/14/2016   CRP 0.9 10/09/2015   Renal Function Lab Results  Component Value Date   BUN 20 09/09/2016   CREATININE 0.99 09/09/2016   GFRAA >60 09/03/2016   GFRNONAA >60 09/03/2016   Hepatic Function Lab Results  Component Value Date   AST 21 02/05/2016   ALT 19 02/05/2016   ALBUMIN 2.6 (L) 02/05/2016   Electrolytes Lab Results   Component Value Date   NA 142 09/09/2016   K 4.2 09/09/2016   CL 102 09/09/2016   CALCIUM 7.8 (L) 09/09/2016   MG 1.8 09/03/2016   Pain Modulating Vitamins Lab Results  Component Value Date   VITAMINB12 371 01/07/2012   Coagulation Parameters Lab Results  Component Value Date   INR 2.6 10/02/2016   LABPROT 33.3 (H) 09/03/2016   APTT 41 (H) 01/24/2015   PLT 127 (L) 09/03/2016   Cardiovascular Lab Results  Component Value Date   BNP 260.5 (H) 08/27/2016   HGB 10.9 (L) 09/05/2016   HCT 32.0 (L) 09/05/2016   Note: Lab results reviewed.  Recent Diagnostic Imaging Review  Ct Soft Tissue Neck W Contrast Result Date: 10/06/2016 CLINICAL DATA:  Left vocal cord paralysis. Prior left parotid resection. EXAM: CT NECK WITH CONTRAST TECHNIQUE: Multidetector CT imaging of the neck was performed using the standard protocol following the bolus administration of intravenous contrast. CONTRAST:  20m ISOVUE-300 IOPAMIDOL (ISOVUE-300) INJECTION 61% COMPARISON:  08/18/2012 neck CT.  11/21/2014 chest  CTA. FINDINGS: Pharynx and larynx: No pharyngeal mass or swelling. There is motion artifact through the larynx which limits assessment. There is suggestion of asymmetric medial ization of the left vocal cord with some thickening of the left AE folds which would be compatible with the provided history of left vocal cord paralysis. No gross laryngeal mass is identified. Salivary glands: Submandibular glands and right parotid gland are unremarkable. Sequelae of interval left parotidectomy are identified with resection of the lipomatous mass extending inferiorly superficial to the sternocleidomastoid muscle on the prior study. Thyroid: Mild diffuse enlargement, nodularity, and heterogeneity of the thyroid without dominant mass identified. Lymph nodes: There is an enlarged 11 mm short axis left supraclavicular lymph node which has increased in size from 2013 (series 3, image 76, previously 6 mm). Additional  subcentimeter left supraclavicular and left thoracic inlet lymph nodes have also mildly enlarged. Right paratracheal lymph nodes near the thoracic inlet have mildly enlarged and measure up to 11 mm in short axis (series 3, image 81). Vascular: Major vascular structures of the neck are patent. Limited intracranial: Unremarkable. Visualized orbits: Not imaged. Mastoids and visualized paranasal sinuses: Unchanged mucous retention cyst or polyp inferiorly in the right maxillary sinus. Minimal left maxillary sinus mucosal thickening. Clear mastoid air cells. Skeleton: Large anterior vertebral osteophytes C4 to T1. Bilateral facet ankylosis at C2-3 and C7-T1. Partial ankylosis across the C2-3 disc space as well. Upper chest: Numerous subcentimeter short axis peritracheal, prevascular, and precarinal lymph nodes are similar to the 2016 chest CTA. Chronic short-segment dissection in the proximal ascending aorta was incompletely imaged and is also partially obscured by motion artifact. Small pleural effusions are partially visualized. Unchanged small left upper lobe lung nodules from 2016, the largest of which measures 3 mm and is calcified. Other: None. IMPRESSION: 1. Mild left supraclavicular and bilateral thoracic inlet lymphadenopathy, increased from 2013 and of indeterminate etiology. 2. Numerous subcentimeter mediastinal lymph nodes, unchanged from 2016. 3. No primary neck mass. 4. Limited assessment of the larynx due to motion. 5. Mildly enlarged and diffusely nodular thyroid likely reflecting goiter. Electronically Signed   By: Logan Bores M.D.   On: 10/06/2016 15:21   Note: Imaging results reviewed.          Meds  The patient has a current medication list which includes the following prescription(s): acetaminophen, allopurinol, atenolol, bacitracin, digoxin, furosemide, magnesium oxide, methocarbamol, oxycodone, oxycodone, oxycodone, potassium chloride, temazepam, and warfarin.  Current Outpatient  Prescriptions on File Prior to Visit  Medication Sig  . acetaminophen (TYLENOL) 500 MG tablet Take 500 mg by mouth every 8 (eight) hours as needed for moderate pain or headache.   . allopurinol (ZYLOPRIM) 300 MG tablet Take 300 mg by mouth daily.  Marland Kitchen atenolol (TENORMIN) 25 MG tablet Take 1 tablet (25 mg total) by mouth 2 (two) times daily.  . bacitracin 500 UNIT/GM ointment Apply 1 application topically as needed for wound care.   . digoxin (LANOXIN) 0.25 MG tablet Take 0.25 mg by mouth daily.  . furosemide (LASIX) 80 MG tablet Take 80 mg (1 tablets) in the AM and 40 mg (0.5 tablet) in the PM.  . magnesium oxide (MAG-OX) 400 (241.3 MG) MG tablet Take 1 tablet (400 mg total) by mouth 2 (two) times daily.  . potassium chloride (K-DUR) 10 MEQ tablet Take 2 tablets (20 mEq total) by mouth 2 (two) times daily.  . temazepam (RESTORIL) 15 MG capsule Take 15 mg by mouth at bedtime as needed for sleep.  Marland Kitchen warfarin (  COUMADIN) 5 MG tablet TAKE 1 TO 1 AND 1/2 TABLETS DAILY AS DIRECTED BY COUMADIN CLINIC (Patient taking differently: Take 5 mg by mouth on Monday, Wednesday and Friday. Take 7.5 mg by mouth on all other days)   No current facility-administered medications on file prior to visit.    ROS  Constitutional: Denies any fever or chills Gastrointestinal: No reported hemesis, hematochezia, vomiting, or acute GI distress Musculoskeletal: Denies any acute onset joint swelling, redness, loss of ROM, or weakness Neurological: No reported episodes of acute onset apraxia, aphasia, dysarthria, agnosia, amnesia, paralysis, loss of coordination, or loss of consciousness  Allergies  Lance Cook is allergic to penicillins; adhesive [tape]; and bactrim [sulfamethoxazole-trimethoprim].  [place]  Drug: Lance Cook  reports that he does not use drugs. Alcohol:  reports that he drinks alcohol. Tobacco:  reports that he has been smoking Cigars.  He has never used smokeless tobacco. Medical:  has a past medical history of  Acute respiratory failure with hypoxemia ([place]); Anemia; Anxiety; Aortic aneurysm ([place]); Aortic aneurysm and dissection (HCC); ARDS (adult respiratory distress syndrome) ([place]) (01/27/2015); Arthritis; Ascending aortic dissection ([place]) (07/14/2008); Asymptomatic chronic venous hypertension (01/15/2010); Atrial fibrillation ([place]); Bell's palsy; CAD (coronary artery disease); Cellulitis (04/02/2014); Cerebral artery occlusion with cerebral infarction [facility]) (10/08/2011); CHF (congestive heart failure) ([place]); Chronic diastolic congestive heart failure ([place]); Chronic diastolic heart failure ([place]) (03/01/2015); Chronic LBP (10/08/2011); CVA (cerebral vascular accident) ([place]) (10/08/2011); Diabetes mellitus; DIABETES MELLITUS, TYPE II (11/01/2009); Diverticulosis; Dysrhythmia; 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); Encephalopathy acute (02/05/2015); Erectile dysfunction (10/08/2011); GERD (gastroesophageal reflux disease); Gout; H/O mechanical aortic valve replacement (11/01/2009); Hiatal hernia; History of adenomatous polyp of colon (12/23/2011); History of colon polyps (12/23/2011); Hyperlipidemia; Hypertension; Hypogonadism male (04/08/2012); Impaired glucose tolerance (10/08/2011); Leg pain (06/28/2010); Morbid obesity ([place]) (04/02/2014); Myocardial infarction (age 15); Obesity; OSA (obstructive sleep apnea); Peripheral vascular disease ([place]); Reflux (11/06/09); 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 Family: family history includes Cancer in his maternal aunt and maternal uncle; Diabetes in his father; Heart disease in his mother; Hypertension in his other; Other in his mother; Stroke in his other; Throat cancer in his mother.  Past Surgical History:   Procedure Laterality Date  . aortic truck    . BENTALL PROCEDURE  1988   25 mm St Jude mechanical valve conduit - Dr Blase Mess at [facility] in [place], [place]  . CHOLECYSTECTOMY  2011  . COLONOSCOPY WITH PROPOFOL N/A 06/17/2016   Procedure: COLONOSCOPY WITH PROPOFOL;  Surgeon: Jerene Bears, MD;  Location: [facility] ENDOSCOPY;  Service: Gastroenterology;  Laterality: N/A;  . CORONARY ARTERY BYPASS GRAFT    . LASER ABLATION Left 03/06/2010   leg  . MITRAL VALVE REPLACEMENT Right 01/24/2015   Procedure: Re-Operation, MINIMALLY INVASIVE MITRAL VALVE (MV) REPLACEMENT.;  Surgeon: Rexene Alberts, MD;  Location: [place];  Service: Open Heart Surgery;  Laterality: Right;  . ORCHIECTOMY  age 53   testicular cancer  . OTOPLASTY     bilateral, age 5  . TEE WITHOUT CARDIOVERSION N/A 10/11/2014   Procedure: TRANSESOPHAGEAL ECHOCARDIOGRAM (TEE);  Surgeon: Pixie Casino, MD;  Location: [facility] ENDOSCOPY;  Service: Cardiovascular;  Laterality: N/A;  . TEE WITHOUT CARDIOVERSION N/A 01/24/2015   Procedure: TRANSESOPHAGEAL ECHOCARDIOGRAM (TEE);  Surgeon: Rexene Alberts, MD;  Location: [place];  Service: Open Heart Surgery;  Laterality: N/A;  . TONSILLECTOMY  1967  . TOOTH EXTRACTION  2016   Constitutional Exam  General appearance: Well nourished, well developed, and well hydrated. In no apparent acute distress Vitals:   10/07/16 1155  BP: 134/64  Pulse: 76  Resp: 16  Temp: 97.7 F (36.5 C)  TempSrc: Oral  SpO2: 98%  Weight: 212 lb (96.2 kg)  Height: 5' 1.5" (1.562 m)   BMI Assessment: Estimated body mass index is 39.41 kg/m as calculated from the following:   Height as of this encounter: 5' 1.5" (1.562 m).   Weight as of this encounter: 212 lb (96.2 kg).  BMI interpretation table: BMI level Category Range association with higher incidence of chronic pain  <18 kg/m2 Underweight   18.5-24.9 kg/m2 Ideal body weight   25-29.9 kg/m2 Overweight  Increased incidence by 20%  30-34.9 kg/m2 Obese (Class I) Increased incidence by 68%  35-39.9 kg/m2 Severe obesity (Class II) Increased incidence by 136%  >40 kg/m2 Extreme obesity (Class III) Increased incidence by 254%   BMI Readings from Last 4 Encounters:  10/07/16 39.41 kg/m  09/30/16 39.48 kg/m  09/09/16 41.97 kg/m  09/03/16 41.98 kg/m   Wt Readings from Last 4 Encounters:  10/07/16 212 lb (96.2 kg)  09/30/16 212 lb 6.4 oz (96.3 kg)  09/09/16 225 lb 12.8 oz (102.4 kg)  09/03/16 222 lb 3.2 oz (100.8 kg)  Psych/Mental status: Alert, oriented x 3 (person, place, & time)       Eyes: PERLA Respiratory: No evidence of acute respiratory distress  Cervical Spine Exam  Inspection: No masses, redness, or swelling Alignment: Symmetrical Functional ROM: Unrestricted ROM Stability: No instability detected Muscle strength & Tone: Functionally intact Sensory: Unimpaired Palpation: Non-contributory  Upper Extremity (UE) Exam    Side: Right upper extremity  Side: Left upper extremity  Inspection: No masses, redness, swelling, or asymmetry. No contractures  Inspection: No masses, redness, swelling, or asymmetry. No contractures  Functional ROM: Unrestricted ROM          Functional ROM: Unrestricted ROM          Muscle strength & Tone: Functionally intact  Muscle strength & Tone: Functionally intact  Sensory: Unimpaired  Sensory: Unimpaired  Palpation: Euthermic  Palpation: Euthermic  Specialized Test(s): Deferred         Specialized Test(s): Deferred          Thoracic Spine Exam  Inspection: No masses, redness, or swelling Alignment: Symmetrical Functional ROM: Unrestricted ROM Stability: No instability detected Sensory: Unimpaired Muscle strength & Tone: Functionally intact Palpation: Non-contributory  Lumbar Spine Exam  Inspection: No masses, redness, or swelling Alignment: Symmetrical Functional ROM: Minimal ROM Stability: No instability detected Muscle strength & Tone:  Functionally intact Sensory: Movement-associated discomfort Palpation: Complains of area being tender to palpation Provocative Tests: Lumbar Hyperextension and rotation test: evaluation deferred today       Patrick's Maneuver: evaluation deferred today              Gait & Posture Assessment  Ambulation: Unassisted Gait: Relatively normal for age and body habitus Posture: WNL   Lower Extremity Exam    Side: Right lower extremity  Side: Left lower extremity  Inspection: No masses, redness, swelling, or asymmetry. No contractures  Inspection: No masses, redness, swelling, or asymmetry. No contractures  Functional ROM: Unrestricted ROM          Functional ROM: Unrestricted ROM  Muscle strength & Tone: Functionally intact  Muscle strength & Tone: Functionally intact  Sensory: Unimpaired  Sensory: Unimpaired  Palpation: No palpable anomalies  Palpation: No palpable anomalies   Assessment  Primary Diagnosis & Pertinent Problem List: The primary encounter diagnosis was Chronic pain syndrome. Diagnoses of Chronic low back pain (midline), Chronic lower extremity pain (Left), Spinal stenosis of lumbar region with neurogenic claudication, Malignant neoplasm of testis, unspecified laterality, unspecified whether descended or undescended [facility]), Long term current use of opiate analgesic, Opiate use (22.5 MME/day), and Musculoskeletal pain were also pertinent to this visit.  Status Diagnosis  Controlled Controlled Controlled 1. Chronic pain syndrome   2. Chronic low back pain (midline)   3. Chronic lower extremity pain (Left)   4. Spinal stenosis of lumbar region with neurogenic claudication   5. Malignant neoplasm of testis, unspecified laterality, unspecified whether descended or undescended ([place])   6. Long term current use of opiate analgesic   7. Opiate use (22.5 MME/day)   8. Musculoskeletal pain      Plan of Care  Pharmacotherapy (Medications Ordered): Meds ordered this encounter   Medications  . oxyCODONE (OXY IR/ROXICODONE) 5 MG immediate release tablet    Sig: Take 1 tablet (5 mg total) by mouth every 8 (eight) hours as needed for severe pain.    Dispense:  90 tablet    Refill:  0    Fill one day early if pharmacy is closed on scheduled refill date. Do not fill until: 10/26/16 To last until: 11/25/16  . methocarbamol (ROBAXIN) 500 MG tablet    Sig: Take 1 tablet (500 mg total) by mouth 3 (three) times daily.    Dispense:  270 tablet    Refill:  0    Do not place this medication, or any other prescription from our practice, on "Automatic Refill". Patient may have prescription filled one day early if pharmacy is closed on scheduled refill date.  Marland Kitchen oxyCODONE (OXY IR/ROXICODONE) 5 MG immediate release tablet    Sig: Take 1 tablet (5 mg total) by mouth every 8 (eight) hours as needed for severe pain.    Dispense:  90 tablet    Refill:  0    Fill one day early if pharmacy is closed on scheduled refill date. Do not fill until: 11/25/16 To last until: 12/25/16  . oxyCODONE (OXY IR/ROXICODONE) 5 MG immediate release tablet    Sig: Take 1 tablet (5 mg total) by mouth every 8 (eight) hours as needed for severe pain.    Dispense:  90 tablet    Refill:  0    Fill one day early if pharmacy is closed on scheduled refill date. Do not fill until: 12/25/16 To last until: 01/24/17   New Prescriptions   OXYCODONE (OXY IR/ROXICODONE) 5 MG IMMEDIATE RELEASE TABLET    Take 1 tablet (5 mg total) by mouth every 8 (eight) hours as needed for severe pain.   OXYCODONE (OXY IR/ROXICODONE) 5 MG IMMEDIATE RELEASE TABLET    Take 1 tablet (5 mg total) by mouth every 8 (eight) hours as needed for severe pain.   Medications administered today: Lance Cook had no medications administered during this visit. Lab-work, procedure(s), and/or referral(s): No orders of the defined types were placed in this encounter.  Imaging and/or referral(s): None  Interventional therapies: Planned,  scheduled, and/or pending:   Not at this time.   Considering:   Bilateral lumbar facet RFA.    Palliative PRN treatment(s):   None at this time.  Provider-requested follow-up: Return in about 3 months (around 01/04/2017) for (NP) Med-Mgmt.  Future Appointments Date Time Provider [place]  10/17/2016 11:30 AM [name], MD [facility]   Primary Care Physician: [name], PA-C Location: [facility] Facility Note by: Kathlen Brunswick. Dossie Arbour, M.D, DABA, DABAPM, DABPM, DABIPP, FIPP Date: 10/07/2016; Time: 12:48 PM  Pain Score Disclaimer: We use the NRS-11 scale. This is a self-reported, subjective measurement of pain severity with only modest accuracy. It is used primarily to identify changes within a particular patient. It must be understood that outpatient pain scales are significantly less accurate that those used for research, where they can be applied under ideal controlled circumstances with minimal exposure to variables. In reality, the score is likely to be a combination of pain intensity and pain affect, where pain affect describes the degree of emotional arousal or changes in action readiness caused by the sensory experience of pain. Factors such as social and work situation, setting, emotional state, anxiety levels, expectation, and prior pain experience may influence pain perception and show large inter-individual differences that may also be affected by time variables.  Patient instructions provided during this appointment: There are no Patient Instructions on file for this visit.

## 2016-10-07 ENCOUNTER — Encounter: Payer: Self-pay | Admitting: Pain Medicine

## 2016-10-07 ENCOUNTER — Ambulatory Visit: Payer: Medicare HMO | Attending: Pain Medicine | Admitting: Pain Medicine

## 2016-10-07 VITALS — BP 134/64 | HR 76 | Temp 97.7°F | Resp 16 | Ht 61.5 in | Wt 212.0 lb

## 2016-10-07 DIAGNOSIS — N529 Male erectile dysfunction, unspecified: Secondary | ICD-10-CM | POA: Diagnosis not present

## 2016-10-07 DIAGNOSIS — M5442 Lumbago with sciatica, left side: Secondary | ICD-10-CM | POA: Diagnosis not present

## 2016-10-07 DIAGNOSIS — M48062 Spinal stenosis, lumbar region with neurogenic claudication: Secondary | ICD-10-CM | POA: Insufficient documentation

## 2016-10-07 DIAGNOSIS — Z880 Allergy status to penicillin: Secondary | ICD-10-CM | POA: Insufficient documentation

## 2016-10-07 DIAGNOSIS — G9340 Encephalopathy, unspecified: Secondary | ICD-10-CM | POA: Insufficient documentation

## 2016-10-07 DIAGNOSIS — Z9889 Other specified postprocedural states: Secondary | ICD-10-CM | POA: Insufficient documentation

## 2016-10-07 DIAGNOSIS — Z8673 Personal history of transient ischemic attack (TIA), and cerebral infarction without residual deficits: Secondary | ICD-10-CM | POA: Insufficient documentation

## 2016-10-07 DIAGNOSIS — Z86010 Personal history of colonic polyps: Secondary | ICD-10-CM | POA: Insufficient documentation

## 2016-10-07 DIAGNOSIS — Z79891 Long term (current) use of opiate analgesic: Secondary | ICD-10-CM | POA: Diagnosis not present

## 2016-10-07 DIAGNOSIS — I2510 Atherosclerotic heart disease of native coronary artery without angina pectoris: Secondary | ICD-10-CM | POA: Diagnosis not present

## 2016-10-07 DIAGNOSIS — Z6839 Body mass index (BMI) 39.0-39.9, adult: Secondary | ICD-10-CM | POA: Insufficient documentation

## 2016-10-07 DIAGNOSIS — Z8249 Family history of ischemic heart disease and other diseases of the circulatory system: Secondary | ICD-10-CM | POA: Insufficient documentation

## 2016-10-07 DIAGNOSIS — J80 Acute respiratory distress syndrome: Secondary | ICD-10-CM | POA: Diagnosis not present

## 2016-10-07 DIAGNOSIS — Z952 Presence of prosthetic heart valve: Secondary | ICD-10-CM | POA: Insufficient documentation

## 2016-10-07 DIAGNOSIS — I340 Nonrheumatic mitral (valve) insufficiency: Secondary | ICD-10-CM | POA: Insufficient documentation

## 2016-10-07 DIAGNOSIS — I252 Old myocardial infarction: Secondary | ICD-10-CM | POA: Diagnosis not present

## 2016-10-07 DIAGNOSIS — Z9049 Acquired absence of other specified parts of digestive tract: Secondary | ICD-10-CM | POA: Insufficient documentation

## 2016-10-07 DIAGNOSIS — I110 Hypertensive heart disease with heart failure: Secondary | ICD-10-CM | POA: Insufficient documentation

## 2016-10-07 DIAGNOSIS — I482 Chronic atrial fibrillation: Secondary | ICD-10-CM | POA: Insufficient documentation

## 2016-10-07 DIAGNOSIS — I5033 Acute on chronic diastolic (congestive) heart failure: Secondary | ICD-10-CM | POA: Insufficient documentation

## 2016-10-07 DIAGNOSIS — C6290 Malignant neoplasm of unspecified testis, unspecified whether descended or undescended: Secondary | ICD-10-CM

## 2016-10-07 DIAGNOSIS — M5441 Lumbago with sciatica, right side: Secondary | ICD-10-CM

## 2016-10-07 DIAGNOSIS — Z833 Family history of diabetes mellitus: Secondary | ICD-10-CM | POA: Insufficient documentation

## 2016-10-07 DIAGNOSIS — I219 Acute myocardial infarction, unspecified: Secondary | ICD-10-CM | POA: Insufficient documentation

## 2016-10-07 DIAGNOSIS — K449 Diaphragmatic hernia without obstruction or gangrene: Secondary | ICD-10-CM | POA: Insufficient documentation

## 2016-10-07 DIAGNOSIS — F419 Anxiety disorder, unspecified: Secondary | ICD-10-CM | POA: Diagnosis not present

## 2016-10-07 DIAGNOSIS — M79605 Pain in left leg: Secondary | ICD-10-CM | POA: Diagnosis not present

## 2016-10-07 DIAGNOSIS — I872 Venous insufficiency (chronic) (peripheral): Secondary | ICD-10-CM | POA: Insufficient documentation

## 2016-10-07 DIAGNOSIS — E119 Type 2 diabetes mellitus without complications: Secondary | ICD-10-CM | POA: Insufficient documentation

## 2016-10-07 DIAGNOSIS — G8929 Other chronic pain: Secondary | ICD-10-CM

## 2016-10-07 DIAGNOSIS — E785 Hyperlipidemia, unspecified: Secondary | ICD-10-CM | POA: Diagnosis not present

## 2016-10-07 DIAGNOSIS — G894 Chronic pain syndrome: Secondary | ICD-10-CM | POA: Insufficient documentation

## 2016-10-07 DIAGNOSIS — Z881 Allergy status to other antibiotic agents status: Secondary | ICD-10-CM | POA: Insufficient documentation

## 2016-10-07 DIAGNOSIS — G4733 Obstructive sleep apnea (adult) (pediatric): Secondary | ICD-10-CM | POA: Insufficient documentation

## 2016-10-07 DIAGNOSIS — G510 Bell's palsy: Secondary | ICD-10-CM | POA: Insufficient documentation

## 2016-10-07 DIAGNOSIS — M109 Gout, unspecified: Secondary | ICD-10-CM | POA: Insufficient documentation

## 2016-10-07 DIAGNOSIS — Z808 Family history of malignant neoplasm of other organs or systems: Secondary | ICD-10-CM | POA: Insufficient documentation

## 2016-10-07 DIAGNOSIS — M7918 Myalgia, other site: Secondary | ICD-10-CM

## 2016-10-07 DIAGNOSIS — Z9109 Other allergy status, other than to drugs and biological substances: Secondary | ICD-10-CM | POA: Insufficient documentation

## 2016-10-07 DIAGNOSIS — Z951 Presence of aortocoronary bypass graft: Secondary | ICD-10-CM | POA: Insufficient documentation

## 2016-10-07 DIAGNOSIS — F1190 Opioid use, unspecified, uncomplicated: Secondary | ICD-10-CM

## 2016-10-07 DIAGNOSIS — Z955 Presence of coronary angioplasty implant and graft: Secondary | ICD-10-CM | POA: Insufficient documentation

## 2016-10-07 DIAGNOSIS — Z7901 Long term (current) use of anticoagulants: Secondary | ICD-10-CM | POA: Insufficient documentation

## 2016-10-07 DIAGNOSIS — K219 Gastro-esophageal reflux disease without esophagitis: Secondary | ICD-10-CM | POA: Insufficient documentation

## 2016-10-07 DIAGNOSIS — D509 Iron deficiency anemia, unspecified: Secondary | ICD-10-CM | POA: Insufficient documentation

## 2016-10-07 DIAGNOSIS — M791 Myalgia: Secondary | ICD-10-CM

## 2016-10-07 DIAGNOSIS — Z9079 Acquired absence of other genital organ(s): Secondary | ICD-10-CM | POA: Insufficient documentation

## 2016-10-07 DIAGNOSIS — Z809 Family history of malignant neoplasm, unspecified: Secondary | ICD-10-CM | POA: Insufficient documentation

## 2016-10-07 MED ORDER — OXYCODONE HCL 5 MG PO TABS: 5.0000 mg | ORAL_TABLET | Freq: Three times a day (TID) | ORAL | 0 refills | Status: DC | PRN

## 2016-10-07 MED ORDER — METHOCARBAMOL 500 MG PO TABS: 500.0000 mg | ORAL_TABLET | Freq: Three times a day (TID) | ORAL | 0 refills | Status: DC

## 2016-10-07 NOTE — Progress Notes (Signed)
Nursing Pain Medication Assessment:  Safety precautions to be maintained throughout the outpatient stay will include: orient to surroundings, keep bed in low position, maintain call bell within reach at all times, provide assistance with transfer out of bed and ambulation.  Medication Inspection Compliance: Pill count conducted under aseptic conditions, in front of the patient. Neither the pills nor the bottle was removed from the patient's sight at any time. Once count was completed pills were immediately returned to the patient in their original bottle.  Medication: See above Pill/Patch Count: 80 of 90 pills remain Bottle Appearance: Standard pharmacy container. Clearly labeled. Filled Date: 01 / 25 /2018 Last Medication intake: today

## 2016-10-16 ENCOUNTER — Ambulatory Visit (INDEPENDENT_AMBULATORY_CARE_PROVIDER_SITE_OTHER): Payer: Medicare HMO

## 2016-10-16 DIAGNOSIS — I482 Chronic atrial fibrillation, unspecified: Secondary | ICD-10-CM

## 2016-10-16 DIAGNOSIS — Z5181 Encounter for therapeutic drug level monitoring: Secondary | ICD-10-CM

## 2016-10-16 LAB — POCT INR: INR: 3.4

## 2016-10-16 NOTE — Progress Notes (Signed)
Cardiology Office Note   Date:  10/19/2016   ID:  Lance Cook, DOB May 28, 1961, MRN [place]:[id number]  PCP:  [name], PA-C  Cardiologist:   [name], MD    Chief Complaint  Patient presents with  . Leg Swelling      History of Present Illness: Lance Cook is a 56 y.o. male who presents for follow up of anasarca.   He has a past medical history of AVR (s/p Bentall procedure in 1988), severe mitral regurgitation (s/p St Jude mechanical valve placed in 12/2014), CAD (s/p SVG-RCA, known to be occluded by cath in 12/2014), chronic atrial fibrillation, chronic diastolic CHF (EF [id number] by echo in 12/2015), prior CVA, chronic venous insufficiency, OSA (on CPAP)and recurrent C. difficile infection.  He presented in Jan with dyspnea and anasarca.  He was hospitalized and had 21 liters removed with diuresis.  He weight 222 lbs at discharge.  After discharge he has been seen in the hospital and he was most concerned about decreased weight and over diuresis so his diuretic dose was reduced.    Since he was last seen he is actually done quite well. He denies any cardiovascular symptoms. He's had no chest pressure, neck or arm discomfort.   He is actually doing very well.  He has had a raspy voice and is having his vocal cord evaluated.  He had a GI illness.  However, he is doing very well from a fluid standpoint.  His weight is down.  The patient denies any new symptoms such as chest discomfort, neck or arm discomfort. There has been no new shortness of breath, PND or orthopnea. There have been no reported palpitations, presyncope or syncope.   Past Medical History:  Diagnosis Date  . Acute respiratory failure with hypoxemia ([place])   . Anemia   . Anxiety   . Aortic aneurysm ([place])   . Aortic aneurysm and dissection ([place])   . ARDS (adult respiratory distress syndrome) ([place]) 01/27/2015  . Arthritis   . Ascending aortic dissection ([place]) 07/14/2008   Localized dissection of ascending aorta noted on  CTA in 2009 and stable on CTA in 2011  . Asymptomatic chronic venous hypertension 01/15/2010   Overview:  Overview:  Qualifier: Diagnosis of  By: [name] MD, [name]   Last Assessment & Plan:  I suggested he go to a vein clinic to see if he could be qualified compression hose of some type, since he is not a surgical candidate according to the vascular surgeons.   . Atrial fibrillation ([place])    chronic persistent  . Bell's palsy   . CAD (coronary artery disease)    Old scar inferior wall myoview, 10/2009 EF 52%.  He did have previous SVG to RCA but no obstructive disease noted on his most recent cath.  SVG occluded.   . Cellulitis 04/02/2014  . Cerebral artery occlusion with cerebral infarction ([place]) 10/08/2011   Overview:  Overview:  And hx of TIA prior to CABG, all thought due to systemic emboli prior to coumadin   . CHF (congestive heart failure) ([place])   . Chronic diastolic congestive heart failure ([place])   . Chronic diastolic heart failure ([place]) 03/01/2015  . Chronic LBP 10/08/2011  . CVA (cerebral vascular accident) ([place]) 10/08/2011   And hx of TIA prior to CABG, all thought due to systemic emboli prior to coumadin   . Diabetes mellitus    diet controlled  . DIABETES MELLITUS, TYPE II 11/01/2009   Qualifier: Diagnosis of  ByAmil Amen MD, [name]    . Diverticulosis   . Dysrhythmia    Dr. [name] follows-LOV 05-08-16 Epic.  . 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 Encephalopathy acute 02/05/2015  . Erectile dysfunction 10/08/2011  . GERD (gastroesophageal reflux disease)    not needing medication at thhis time- 01/22/15  . Gout   . H/O mechanical aortic valve replacement 11/01/2009   Qualifier: Diagnosis of  By: [name], [name]    . Hiatal hernia   . History of adenomatous polyp of colon 12/23/2011   Colonoscopy January 2010, 6 mm rectal tubulovillous adenoma.  No high-grade dysplasia   . History of colon polyps 12/23/2011   Overview:  Overview:  Colonoscopy January 2010, 6 mm rectal tubulovillous adenoma. No high-grade dysplasia   . Hyperlipidemia   . Hypertension   . Hypogonadism male 04/08/2012  . Impaired glucose tolerance 10/08/2011  . Leg pain 06/28/2010  . Morbid obesity ([place]) 04/02/2014  . Myocardial infarction age 54  . Obesity   . OSA (obstructive sleep apnea)    CPAP  . Peripheral vascular disease ([place])   . Reflux 11/06/09  . 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 [id number]   33 mm St Jude bileaflet mechanical valve placed via right mini thoracotomy approach  . 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    1988 - Dr Blase Mess at [facility] in [place], [place]  . Severe mitral regurgitation 10/11/2014  . Shortness of breath dyspnea    with exertion  . Stroke [facility])    TIA and Stroke no residual effect- 1st was 75  . Testicular cancer [facility])    He was 56 y/o. He had surgical resection and rad tx's.   Marland Kitchen TIA (transient ischemic attack)    age 21  . Varicose veins     Past Surgical History:  Procedure Laterality Date  . aortic truck    . BENTALL PROCEDURE  1988   25 mm St Jude mechanical valve conduit - Dr Blase Mess at [facility] in [place], [place]  . CHOLECYSTECTOMY  2011  . COLONOSCOPY WITH PROPOFOL N/A 06/17/2016   Procedure: COLONOSCOPY WITH PROPOFOL;  Surgeon: Jerene Bears, MD;  Location: [facility] ENDOSCOPY;  Service: Gastroenterology;  Laterality: N/A;  . CORONARY ARTERY BYPASS GRAFT    . LASER ABLATION Left 03/06/2010   leg  . MITRAL VALVE REPLACEMENT Right 01/24/2015   Procedure: Re-Operation, MINIMALLY INVASIVE MITRAL VALVE (MV) REPLACEMENT.;  Surgeon: Rexene Alberts, MD;  Location: [place];  Service: Open Heart Surgery;  Laterality: Right;  . ORCHIECTOMY  age 39   testicular cancer  . OTOPLASTY      bilateral, age 69  . TEE WITHOUT CARDIOVERSION N/A 10/11/2014   Procedure: TRANSESOPHAGEAL ECHOCARDIOGRAM (TEE);  Surgeon: Pixie Casino, MD;  Location: [facility] ENDOSCOPY;  Service: Cardiovascular;  Laterality: N/A;  . TEE WITHOUT CARDIOVERSION N/A 01/24/2015   Procedure: TRANSESOPHAGEAL ECHOCARDIOGRAM (TEE);  Surgeon: Rexene Alberts, MD;  Location: [place];  Service: Open Heart Surgery;  Laterality: N/A;  . TONSILLECTOMY  1967  . TOOTH EXTRACTION  2016     Current Outpatient Prescriptions  Medication Sig Dispense Refill  . acetaminophen (TYLENOL) 500 MG tablet Take 500 mg by mouth every 8 (eight) hours as needed for moderate  pain or headache.     . allopurinol (ZYLOPRIM) 300 MG tablet Take 300 mg by mouth daily.    Marland Kitchen atenolol (TENORMIN) 25 MG tablet Take 1 tablet (25 mg total) by mouth 2 (two) times daily.    . bacitracin 500 UNIT/GM ointment Apply 1 application topically as needed for wound care.     . digoxin (LANOXIN) 0.25 MG tablet Take 0.25 mg by mouth daily.    . furosemide (LASIX) 80 MG tablet Take 80 mg (1 tablets) in the AM and 40 mg (0.5 tablet) in the PM. 60 tablet 3  . magnesium oxide (MAG-OX) 400 (241.3 MG) MG tablet Take 1 tablet (400 mg total) by mouth 2 (two) times daily. 180 tablet 3  . [START ON 10/26/2016] methocarbamol (ROBAXIN) 500 MG tablet Take 1 tablet (500 mg total) by mouth 3 (three) times daily. 270 tablet 0  . [START ON 12/25/2016] oxyCODONE (OXY IR/ROXICODONE) 5 MG immediate release tablet Take 1 tablet (5 mg total) by mouth every 8 (eight) hours as needed for severe pain. 90 tablet 0  . potassium chloride (K-DUR) 10 MEQ tablet Take 2 tablets (20 mEq total) by mouth 2 (two) times daily. 360 tablet 1  . temazepam (RESTORIL) 15 MG capsule Take 15 mg by mouth at bedtime as needed for sleep.    Marland Kitchen warfarin (COUMADIN) 5 MG tablet TAKE 1 TO 1 AND 1/2 TABLETS DAILY AS DIRECTED BY COUMADIN CLINIC (Patient taking differently: Take 5 mg by mouth on Monday, Wednesday and Friday. Take  7.5 mg by mouth on all other days) 135 tablet 0   No current facility-administered medications for this visit.     Allergies:   Penicillins; Adhesive [tape]; and Bactrim [sulfamethoxazole-trimethoprim]   ROS:  Please see the history of present illness.   Otherwise, review of systems are positive for none.   All other systems are reviewed and negative.    PHYSICAL EXAM: VS:  BP 114/66   Pulse 60   Ht 5' 1.5" (1.562 m)   Wt 208 lb (94.3 kg)   BMI 38.66 kg/m  , BMI Body mass index is 38.66 kg/m. GENERAL:  Well appearing NECK:  No jugular venous distention, waveform within normal limits, carotid upstroke brisk and symmetric, no bruits, no thyromegaly LYMPHATICS:  No cervical, inguinal adenopathy LUNGS:  Clear to auscultation bilaterally BACK:  No CVA tenderness CHEST:  Well healed sternotomy scar, and right thoracotomy scar HEART:  PMI not displaced or sustained,S1 WNL, mechanical S2, no S3, no clicks, no rubs, apical systolic early peaking murmur, irregular ABD:  Flat, positive bowel sounds normal in frequency in pitch, no bruits, no rebound, no guarding, no midline pulsatile mass, no hepatomegaly, no splenomegaly EXT:  2 plus pulses throughout, calf edema but otherwise much better than previous,  lymphedema, no cyanosis no clubbing, venous stasis and varicose veins    EKG:  EKG is not ordered today.   Recent Labs: 02/05/2016: ALT 19 08/27/2016: B Natriuretic Peptide 260.5 09/03/2016: Magnesium 1.8; Platelets 127 09/05/2016: Hemoglobin 10.9 09/09/2016: BUN 20; Creat 0.99; Potassium 4.2; Sodium 142    Lipid Panel    Component Value Date/Time   CHOL 118 08/18/2012 1137   TRIG 133.0 08/18/2012 1137   HDL 27.20 (L) 08/18/2012 1137   CHOLHDL 4 08/18/2012 1137   VLDL 26.6 08/18/2012 1137   LDLCALC 64 08/18/2012 1137      Wt Readings from Last 3 Encounters:  10/17/16 208 lb (94.3 kg)  10/07/16 212 lb (96.2 kg)  09/30/16  212 lb 6.4 oz (96.3 kg)      Other studies  Reviewed: Additional studies/ records that were reviewed today include: Hospital records. Review of the above records demonstrates:  Please see elsewhere in the note.     ASSESSMENT AND PLAN:   MVR: This was stable on his recent echo. No change in therapy is indicated.   AVR:   This was stable on follow-up.  No imaging is indicated at this time.    HTN:  The blood pressure is at target. No change in medications is indicated. We will continue with therapeutic lifestyle changes (TLC).;  EDEMA:  This is better than I have seen this before.  He will continue with the current dose of diuretic.    ATRIAL FIB:  The patient  tolerates this rhythm and rate control and anticoagulation. We will continue with the meds as listed.  Current medicines are reviewed at length with the patient today.  The patient does not have concerns regarding medicines.  The following changes have been made:  no change  Labs/ tests ordered today include: None No orders of the defined types were placed in this encounter.    Disposition:   FU with APP in 3 months.     Signed, [name], MD  10/19/2016 8:22 PM    [facility]

## 2016-10-17 ENCOUNTER — Ambulatory Visit (INDEPENDENT_AMBULATORY_CARE_PROVIDER_SITE_OTHER): Payer: Medicare HMO | Admitting: Cardiology

## 2016-10-17 ENCOUNTER — Encounter: Payer: Self-pay | Admitting: Cardiology

## 2016-10-17 VITALS — BP 114/66 | HR 60 | Ht 61.5 in | Wt 208.0 lb

## 2016-10-17 DIAGNOSIS — R601 Generalized edema: Secondary | ICD-10-CM

## 2016-10-17 DIAGNOSIS — I5032 Chronic diastolic (congestive) heart failure: Secondary | ICD-10-CM

## 2016-10-17 DIAGNOSIS — R490 Dysphonia: Secondary | ICD-10-CM

## 2016-10-17 NOTE — Patient Instructions (Addendum)
Medication Instructions:  Your physician recommends that you continue on your current medications as directed. Please refer to the Current Medication list given to you today.  Labwork: None Ordered  Testing/Procedures: None Ordered  Follow-Up: Your physician recommends that you schedule a follow-up appointment in: 3 months with Rosaria Ferries, PA or Kerin Ransom, [place].  Any Other Special Instructions Will Be Listed Below (If Applicable).   If you need a refill on your cardiac medications before your next appointment, please call your pharmacy.

## 2016-10-19 ENCOUNTER — Encounter: Payer: Self-pay | Admitting: Cardiology

## 2016-10-22 ENCOUNTER — Telehealth: Payer: Self-pay | Admitting: Cardiology

## 2016-10-22 NOTE — Telephone Encounter (Signed)
Returned call to Amy with [facility]-patient instructed her that there was mention of blood work needed to be collected when she had her visit but she does not have orders.    Advised I am unable to locate any mention of need for labs in last OV note as well as in last labs on 2/1, no active orders as well.  Amy states she will be back next week if anything needs to be collected.  Advised I would route to MD to verify if labs were mentioned or needed.  If labs are needed, Amy can be reached at [phone]

## 2016-10-22 NOTE — Telephone Encounter (Signed)
New message    Need a verbal to draw labs at patient home.

## 2016-10-22 NOTE — Telephone Encounter (Signed)
Order a BMET when she returns.   Thanks.

## 2016-10-24 NOTE — Telephone Encounter (Signed)
Spoke with Amy, letting her know Dr [name] wants her to draw BMP at next visit

## 2016-11-05 ENCOUNTER — Ambulatory Visit (INDEPENDENT_AMBULATORY_CARE_PROVIDER_SITE_OTHER): Payer: Medicare HMO | Admitting: Interventional Cardiology

## 2016-11-05 DIAGNOSIS — Z5181 Encounter for therapeutic drug level monitoring: Secondary | ICD-10-CM

## 2016-11-05 DIAGNOSIS — I482 Chronic atrial fibrillation, unspecified: Secondary | ICD-10-CM

## 2016-11-05 LAB — POCT INR: INR: 4.4

## 2016-11-06 ENCOUNTER — Other Ambulatory Visit: Payer: Self-pay | Admitting: *Deleted

## 2016-11-06 ENCOUNTER — Other Ambulatory Visit: Payer: Self-pay | Admitting: Pain Medicine

## 2016-11-06 ENCOUNTER — Other Ambulatory Visit: Payer: Self-pay

## 2016-11-06 DIAGNOSIS — M7918 Myalgia, other site: Secondary | ICD-10-CM

## 2016-11-06 MED ORDER — METHOCARBAMOL 500 MG PO TABS: 500.0000 mg | ORAL_TABLET | Freq: Three times a day (TID) | ORAL | 0 refills | Status: DC

## 2016-11-06 MED FILL — METHOCARBAMOL 500 MG TABLET: 500 | 90 days supply | Qty: 270 | Fill #0

## 2016-11-06 NOTE — Telephone Encounter (Signed)
Dr Lowella Dandy was suppose to send a script for patients methocarbamol the last time patient was here, pt said he never did and pt would like for him to do that by 4:00 today because that is when pt will be leaving the house.

## 2016-11-06 NOTE — Telephone Encounter (Signed)
Spoke with Mr Dials confirmed and corrected pharmacy choice.  Methocarbamol escribed to [place], [place], [place]

## 2016-11-10 ENCOUNTER — Telehealth: Payer: Self-pay | Admitting: Cardiology

## 2016-11-10 NOTE — Telephone Encounter (Signed)
Returned call to patient. Digoxin and Lanoxin are not covered under patient's insurance. He would like to know if there is something he can take in place of this. He states he has been on this medication a long time. He has about 10 days of digoxin left.   Will forward to Dr. [name] to review/advise

## 2016-11-10 NOTE — Telephone Encounter (Signed)
New Message    Please call him before you speak with Dr [name] Pt can not longer afford this medication   digoxin (LANOXIN) 0.25 MG tablet Take 0.25 mg by mouth daily.

## 2016-11-10 NOTE — Telephone Encounter (Signed)
I find it hard to believe that they won't cover this generic drug.  Can you please have our pharmacists look into this?

## 2016-11-12 NOTE — Telephone Encounter (Signed)
Called [employer] today.   Digoxin 0.25mg  is covered under the patient's current policy as Tier 4 medication.

## 2016-11-12 NOTE — Telephone Encounter (Signed)
Patient has [place]  Routed to pharmacy staff for assistance

## 2016-11-13 MED ORDER — DIGOXIN 250 MCG PO TABS: 0.2500 mg | ORAL_TABLET | Freq: Every day | ORAL | 1 refills | Status: DC

## 2016-11-13 NOTE — Telephone Encounter (Signed)
How much would it cost if he paid out of pocket.

## 2016-11-13 NOTE — Telephone Encounter (Signed)
Digoxin 0.25mg  - 30 day supply $42.39 cash (no-insurance)  Digoxin 0.25mg  - 30 day supply $27.30 co-pay (used to be $5.00 per month)

## 2016-11-14 NOTE — Telephone Encounter (Signed)
Looks like this is the best we can do for the cost of digoxin.

## 2016-11-17 ENCOUNTER — Other Ambulatory Visit: Payer: Self-pay | Admitting: Cardiology

## 2016-11-17 NOTE — Telephone Encounter (Signed)
New message      *STAT* If patient is at the pharmacy, call can be transferred to refill team.   1. Which medications need to be refilled? (please list name of each medication and dose if known)   digoxin (LANOXIN) 0.25 MG tablet Take 1 tablet (0.25 mg total) by mouth daily.     2. Which pharmacy/location (including street and city if local pharmacy) is medication to be sent to? [facility]   3. Do they need a 30 day or 90 day supply? 90 Please call when prescription has been called in

## 2016-11-17 NOTE — Telephone Encounter (Signed)
Spoke with pt about his Digoxin, he stated he's going to call around to different pharmacy and see which have it the cheapest, pt is willing to pay for this medication.

## 2016-11-18 MED ORDER — DIGOXIN 250 MCG PO TABS: 0.2500 mg | ORAL_TABLET | Freq: Every day | ORAL | 3 refills | Status: DC

## 2016-11-18 NOTE — Telephone Encounter (Signed)
Rx(s) sent to pharmacy electronically.  

## 2016-11-19 ENCOUNTER — Ambulatory Visit (INDEPENDENT_AMBULATORY_CARE_PROVIDER_SITE_OTHER): Payer: Medicare HMO | Admitting: Pharmacist

## 2016-11-19 DIAGNOSIS — I482 Chronic atrial fibrillation, unspecified: Secondary | ICD-10-CM

## 2016-11-19 DIAGNOSIS — I481 Persistent atrial fibrillation: Secondary | ICD-10-CM | POA: Diagnosis not present

## 2016-11-19 DIAGNOSIS — Z5181 Encounter for therapeutic drug level monitoring: Secondary | ICD-10-CM

## 2016-11-19 DIAGNOSIS — Z952 Presence of prosthetic heart valve: Secondary | ICD-10-CM | POA: Diagnosis not present

## 2016-11-19 DIAGNOSIS — I4819 Other persistent atrial fibrillation: Secondary | ICD-10-CM

## 2016-11-19 LAB — POCT INR: INR: 2.7

## 2016-11-27 ENCOUNTER — Encounter: Payer: Self-pay | Admitting: Cardiology

## 2016-11-27 NOTE — Progress Notes (Signed)
Follow-up     Cardiology Office Note   Date:  11/28/2016   ID:  Lance Cook, DOB 06-02-1961, MRN [id number]  PCP:  [name], PA-C  Cardiologist:   [name], MD    Chief Complaint  Patient presents with  . Edema      History of Present Illness: Lance Cook is a 56 y.o. male who presents for follow up of anasarca.   He has a past medical history of AVR (s/p Bentall procedure in 1988), severe mitral regurgitation (s/p St Jude mechanical valve placed in 12/2014), CAD (s/p SVG-RCA, known to be occluded by cath in 12/2014), chronic atrial fibrillation, chronic diastolic CHF (EF 08-65% by echo in 12/2015), prior CVA, chronic venous insufficiency, OSA (on CPAP)and recurrent C. difficile infection.  He presented in Jan with dyspnea and anasarca.  He was hospitalized and had 21 liters removed with diuresis.  He weight 222 lbs at discharge.  Since being here last time he has again accumulated fluid.  He is clearly not complaint with diet.  He notes that his weight has gone up but he has not contacted [place].  He sometimes doesn't take his Lasix if he is cramping.  He has some sluggishness but not dyspnea, PND or orthopnea.     Past Medical History:  Diagnosis Date  . Anemia   . Anxiety   . ARDS (adult respiratory distress syndrome) ([place]) 01/27/2015  . Arthritis   . Ascending aortic dissection ([place]) 07/14/2008   Localized dissection of ascending aorta noted on CTA in 2009 and stable on CTA in 2011  . Asymptomatic chronic venous hypertension 01/15/2010   Overview:  Overview:  Qualifier: Diagnosis of  By: [name] MD, [name]   Last Assessment & Plan:  I suggested he go to a vein clinic to see if he could be qualified compression hose of some type, since he is not a surgical candidate according to the vascular surgeons.   . Atrial fibrillation ([place])    chronic persistent  . Bell's palsy   . CAD (coronary artery disease)    Old scar inferior wall myoview, 10/2009 EF 52%.  He did have  previous SVG to RCA but no obstructive disease noted on his most recent cath.  SVG occluded.   . Cellulitis 04/02/2014  . Cerebral artery occlusion with cerebral infarction () 10/08/2011   Overview:  Overview:  And hx of TIA prior to CABG, all thought due to systemic emboli prior to coumadin   . Chronic diastolic heart failure ([place]) 03/01/2015  . Chronic LBP 10/08/2011  . CVA (cerebral vascular accident) ([place]) 10/08/2011   And hx of TIA prior to CABG, all thought due to systemic emboli prior to coumadin   . DIABETES MELLITUS, TYPE II 11/01/2009   Qualifier: Diagnosis of  By: [name] MD, [name]    . Diverticulosis   . 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 Encephalopathy acute 02/05/2015  . GERD (gastroesophageal reflux disease)    not needing medication at thhis time- 01/22/15  . Gout   . H/O mechanical aortic valve replacement 11/01/2009   Qualifier: Diagnosis of  By: [name], [name]    . Hiatal hernia   . History of colon polyps 12/23/2011   Overview:  Overview:  Colonoscopy January 2010, 6 mm rectal tubulovillous adenoma. No high-grade dysplasia   . Hyperlipidemia   . Hypertension   .  Hypogonadism male 04/08/2012  . Impaired glucose tolerance 10/08/2011  . Morbid obesity ([place]) 04/02/2014  . Myocardial infarction age 78  . OSA (obstructive sleep apnea)    CPAP  . Peripheral vascular disease ([place])   . 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 1/95/0932   33 mm St Jude bileaflet mechanical valve placed via right mini thoracotomy approach  . 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    1988 - Dr Blase Mess at [facility] in [place], [place]  . Severe mitral regurgitation 10/11/2014  . Testicular cancer [facility])    He was 56 y/o. He had surgical resection and rad tx's.   Marland Kitchen TIA (transient ischemic  attack)    age 34  . Varicose veins     Past Surgical History:  Procedure Laterality Date  . aortic truck    . BENTALL PROCEDURE  1988   25 mm St Jude mechanical valve conduit - Dr Blase Mess at [facility] in [place], [place]  . CHOLECYSTECTOMY  2011  . COLONOSCOPY WITH PROPOFOL N/A 06/17/2016   Procedure: COLONOSCOPY WITH PROPOFOL;  Surgeon: Jerene Bears, MD;  Location: [facility] ENDOSCOPY;  Service: Gastroenterology;  Laterality: N/A;  . CORONARY ARTERY BYPASS GRAFT    . LASER ABLATION Left 03/06/2010   leg  . MITRAL VALVE REPLACEMENT Right 01/24/2015   Procedure: Re-Operation, MINIMALLY INVASIVE MITRAL VALVE (MV) REPLACEMENT.;  Surgeon: Rexene Alberts, MD;  Location: [facility];  Service: Open Heart Surgery;  Laterality: Right;  . ORCHIECTOMY  age 30   testicular cancer  . OTOPLASTY     bilateral, age 41  . TEE WITHOUT CARDIOVERSION N/A 10/11/2014   Procedure: TRANSESOPHAGEAL ECHOCARDIOGRAM (TEE);  Surgeon: Pixie Casino, MD;  Location: [facility] ENDOSCOPY;  Service: Cardiovascular;  Laterality: N/A;  . TEE WITHOUT CARDIOVERSION N/A 01/24/2015   Procedure: TRANSESOPHAGEAL ECHOCARDIOGRAM (TEE);  Surgeon: Rexene Alberts, MD;  Location: [facility];  Service: Open Heart Surgery;  Laterality: N/A;  . TONSILLECTOMY  1967  . TOOTH EXTRACTION  2016     Current Outpatient Prescriptions  Medication Sig Dispense Refill  . acetaminophen (TYLENOL) 500 MG tablet Take 500 mg by mouth every 8 (eight) hours as needed for moderate pain or headache.     . allopurinol (ZYLOPRIM) 300 MG tablet Take 300 mg by mouth daily.    Marland Kitchen atenolol (TENORMIN) 25 MG tablet Take 1 tablet (25 mg total) by mouth 2 (two) times daily.    . bacitracin 500 UNIT/GM ointment Apply 1 application topically as needed for wound care.     . digoxin (LANOXIN) 0.25 MG tablet Take 1 tablet (0.25 mg total) by mouth daily. 90 tablet 3  . furosemide (LASIX) 80 MG tablet Take 80 mg by mouth 2 (two)  times daily.    . magnesium oxide (MAG-OX) 400 (241.3 MG) MG tablet Take 1 tablet (400 mg total) by mouth 2 (two) times daily. 180 tablet 3  . methocarbamol (ROBAXIN) 500 MG tablet Take 1 tablet (500 mg total) by mouth 3 (three) times daily. 270 tablet 0  . [START ON 12/25/2016] oxyCODONE (OXY IR/ROXICODONE) 5 MG immediate release tablet Take 1 tablet (5 mg total) by mouth every 8 (eight) hours as needed for severe pain. 90 tablet 0  . potassium chloride (K-DUR) 10 MEQ tablet Take 2 tablets (20 mEq total) by mouth 2 (two) times daily. 360 tablet 1  . temazepam (  RESTORIL) 15 MG capsule Take 15 mg by mouth at bedtime as needed for sleep.    Marland Kitchen warfarin (COUMADIN) 5 MG tablet TAKE 1 TO 1 AND 1/2 TABLETS DAILY AS DIRECTED BY COUMADIN CLINIC (Patient taking differently: Take 5 mg by mouth on Monday, Wednesday and Friday. Take 7.5 mg by mouth on all other days) 135 tablet 0   No current facility-administered medications for this visit.     Allergies:   Penicillins; Adhesive [tape]; and Bactrim [sulfamethoxazole-trimethoprim]   ROS:  Please see the history of present illness.   Otherwise, review of systems are positive for none.   All other systems are reviewed and negative.    PHYSICAL EXAM: VS:  BP 121/68   Pulse 78   Ht 5' 1"  (1.549 m)   Wt 226 lb (102.5 kg)   BMI 42.70 kg/m  , BMI Body mass index is 42.7 kg/m. GENERAL:  Well appearing NECK:  No jugular venous distention, waveform within normal limits, carotid upstroke brisk and symmetric, no bruits, no thyromegaly LYMPHATICS:  No cervical, inguinal adenopathy LUNGS:  Clear to auscultation bilaterally BACK:  No CVA tenderness CHEST:  Well healed sternotomy scar, and right thoracotomy scar HEART:  PMI not displaced or sustained,S1 WNL, mechanical S2, no S3, no clicks, no rubs, apical systolic early peaking murmur, irregular ABD:  Flat, positive bowel sounds normal in frequency in pitch, no bruits, no rebound, no guarding, no midline pulsatile  mass, no hepatomegaly, no splenomegaly EXT:  2 plus pulses throughout, calf edema but otherwise much better than previous,  lymphedema, no cyanosis no clubbing, venous stasis and varicose veins    EKG:  EKG is not ordered today.   Recent Labs: 02/05/2016: ALT 19 08/27/2016: B Natriuretic Peptide 260.5 09/03/2016: Magnesium 1.8; Platelets 127 09/05/2016: Hemoglobin 10.9 09/09/2016: BUN 20; Creat 0.99; Potassium 4.2; Sodium 142    Lipid Panel    Component Value Date/Time   CHOL 118 08/18/2012 1137   TRIG 133.0 08/18/2012 1137   HDL 27.20 (L) 08/18/2012 1137   CHOLHDL 4 08/18/2012 1137   VLDL 26.6 08/18/2012 1137   LDLCALC 64 08/18/2012 1137      Wt Readings from Last 3 Encounters:  11/28/16 226 lb (102.5 kg)  10/17/16 208 lb (94.3 kg)  10/07/16 212 lb (96.2 kg)      Other studies Reviewed: Additional studies/ records that were reviewed today include: Hospital records. Review of the above records demonstrates:  Please see elsewhere in the note.     ASSESSMENT AND PLAN:   MVR: This was stable on his recent echo. No further imaging at this point.  No change in therapy is indicated.   AVR:   This was stable on follow-up.  We will follow with echoes in the future.   HTN:   The blood pressure is at target.  EDEMA:    This has increased again.  I will increase the Lasix by adding another 40 mg daily.  He is going to wrap his legs. We'll keep his feet elevated. We talked about salt and fluid in. Will get be met in 1 week and he needs to come back here and see one of our wheezing APPs   ATRIAL FIB:   The patient  tolerates this rhythm and rate control and anticoagulation. No change in therapy.   Current medicines are reviewed at length with the patient today.  The patient does not have concerns regarding medicines.  The following changes have been made:  As above  Labs/ tests ordered today include:   Orders Placed This Encounter  Procedures  . Basic Metabolic Panel  (BMET)     Disposition:   FU with APP in one week  Signed, [name], MD  11/28/2016 1:23 PM    [facility]

## 2016-11-28 ENCOUNTER — Encounter: Payer: Self-pay | Admitting: Cardiology

## 2016-11-28 ENCOUNTER — Ambulatory Visit (INDEPENDENT_AMBULATORY_CARE_PROVIDER_SITE_OTHER): Payer: Medicare HMO | Admitting: Cardiology

## 2016-11-28 VITALS — BP 121/68 | HR 78 | Ht 61.0 in | Wt 226.0 lb

## 2016-11-28 DIAGNOSIS — I482 Chronic atrial fibrillation, unspecified: Secondary | ICD-10-CM

## 2016-11-28 DIAGNOSIS — Z79899 Other long term (current) drug therapy: Secondary | ICD-10-CM

## 2016-11-28 DIAGNOSIS — M7989 Other specified soft tissue disorders: Secondary | ICD-10-CM

## 2016-11-28 DIAGNOSIS — I5032 Chronic diastolic (congestive) heart failure: Secondary | ICD-10-CM

## 2016-11-28 DIAGNOSIS — Z952 Presence of prosthetic heart valve: Secondary | ICD-10-CM

## 2016-11-28 NOTE — Patient Instructions (Addendum)
Medication Instructions:  INCREASE- Lasix an extra 40 mg everyday   Labwork: BMP  Testing/Procedures: None Ordered  Follow-Up: Your physician recommends that you schedule a follow-up appointment in: Next Week with Melvyn Neth   Any Other Special Instructions Will Be Listed Below (If Applicable).   If you need a refill on your cardiac medications before your next appointment, please call your pharmacy.

## 2016-11-30 DIAGNOSIS — J3801 Paralysis of vocal cords and larynx, unilateral: Secondary | ICD-10-CM | POA: Insufficient documentation

## 2016-12-03 ENCOUNTER — Other Ambulatory Visit: Payer: Medicare HMO | Admitting: *Deleted

## 2016-12-03 ENCOUNTER — Ambulatory Visit (INDEPENDENT_AMBULATORY_CARE_PROVIDER_SITE_OTHER): Payer: Medicare HMO | Admitting: *Deleted

## 2016-12-03 DIAGNOSIS — I4819 Other persistent atrial fibrillation: Secondary | ICD-10-CM

## 2016-12-03 DIAGNOSIS — I10 Essential (primary) hypertension: Secondary | ICD-10-CM

## 2016-12-03 DIAGNOSIS — Z5181 Encounter for therapeutic drug level monitoring: Secondary | ICD-10-CM

## 2016-12-03 DIAGNOSIS — I482 Chronic atrial fibrillation, unspecified: Secondary | ICD-10-CM

## 2016-12-03 DIAGNOSIS — Z952 Presence of prosthetic heart valve: Secondary | ICD-10-CM

## 2016-12-03 DIAGNOSIS — I4891 Unspecified atrial fibrillation: Secondary | ICD-10-CM

## 2016-12-03 DIAGNOSIS — I481 Persistent atrial fibrillation: Secondary | ICD-10-CM

## 2016-12-03 LAB — BASIC METABOLIC PANEL
BUN / CREAT RATIO: 24 — AB (ref 9–20)
BUN: 18 mg/dL (ref 6–24)
CALCIUM: 8.5 mg/dL — AB (ref 8.7–10.2)
CO2: 31 mmol/L — ABNORMAL HIGH (ref 18–29)
Chloride: 95 mmol/L — ABNORMAL LOW (ref 96–106)
Creatinine, Ser: 0.75 mg/dL — ABNORMAL LOW (ref 0.76–1.27)
GFR, EST AFRICAN AMERICAN: 119 mL/min/{1.73_m2} (ref >59)
GFR, EST NON AFRICAN AMERICAN: 103 mL/min/{1.73_m2} (ref >59)
Glucose: 76 mg/dL (ref 65–99)
Potassium: 4.1 mmol/L (ref 3.5–5.2)
Sodium: 139 mmol/L (ref 134–144)

## 2016-12-03 LAB — POCT INR: INR: 1.8

## 2016-12-03 MED FILL — DIGOXIN 250 MCG TABLET: 250 | 30 days supply | Qty: 30 | Fill #0

## 2016-12-03 NOTE — Progress Notes (Signed)
Cardiology Office Note    Date:  12/05/2016   ID:  Lance Cook, DOB Feb 21, 1961, MRN [id number]  PCP:  [name], PA-C  Cardiologist: Dr. [name]   Chief Complaint  Patient presents with  . Follow-up    1 wwek f/u per Dr [name]    History of Present Illness:    Lance Cook is a 56 y.o. male with past medical history of AVR (s/p Bentall procedure in 1988), severe mitral regurgitation (s/p St Jude mechanical valve placed in 12/2014), CAD (s/p SVG-RCA, known to be occluded by cath in 12/2014), chronic atrial fibrillation, chronic diastolic CHF (EF 99-24% by echo in 12/2015), prior CVA, chronic venous insufficiency, and OSA (on CPAP)who presents to the office today for follow-up.   He was last seen by Dr. [name] on 11/28/2016 and reported worsening edema since his last visit and not taking his Lasix as prescribed secondary to cramping sensations. Weight was at 226 lbs at that time, previously at 212 lbs and 208 lbs in 10/2016. He was continued on Lasix 80mg  BID with an additional 40mg  daily added to his regimen.   BMET checked on 4/4 showed K+ of 4.1 and creatinine at 0.75.  In talking with the patient today, he reports significant improvement in his lower extremity edema since last office visit. Has baseline dyspnea on exertion but denies any acute worsening of his symptoms. No orthopnea or PND. Says he had been consuming [place] food 2-3x per week up until his office visit on 3/30 and has since quit consuming this. Was encouraged to continue to limit sodium intake.   He denies any recent chest discomfort or palpitations. Is interested in returning to the gym and lifting weights to improve his strength.   Past Medical History:  Diagnosis Date  . Anemia   . Anxiety   . ARDS (adult respiratory distress syndrome) ([place]) 01/27/2015  . Arthritis   . Ascending aortic dissection ([place]) 07/14/2008   Localized dissection of ascending aorta noted on CTA in 2009 and stable on CTA in 2011  .  Asymptomatic chronic venous hypertension 01/15/2010   Overview:  Overview:  Qualifier: Diagnosis of  By: [name] MD, [name]   Last Assessment & Plan:  I suggested he go to a vein clinic to see if he could be qualified compression hose of some type, since he is not a surgical candidate according to the vascular surgeons.   . Atrial fibrillation ([place])    chronic persistent  . Bell's palsy   . CAD (coronary artery disease)    Old scar inferior wall myoview, 10/2009 EF 52%.  He did have previous SVG to RCA but no obstructive disease noted on his most recent cath.  SVG occluded.   . Cellulitis 04/02/2014  . Cerebral artery occlusion with cerebral infarction ([place]) 10/08/2011   Overview:  Overview:  And hx of TIA prior to CABG, all thought due to systemic emboli prior to coumadin   . Chronic diastolic heart failure ([place]) 03/01/2015  . Chronic LBP 10/08/2011  . CVA (cerebral vascular accident) ([place]) 10/08/2011   And hx of TIA prior to CABG, all thought due to systemic emboli prior to coumadin   . DIABETES MELLITUS, TYPE II 11/01/2009   Qualifier: Diagnosis of  By: [name] MD, [name]    . Diverticulosis   . 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 Encephalopathy  acute 02/05/2015  . GERD (gastroesophageal reflux disease)    not needing medication at thhis time- 01/22/15  . Gout   . H/O mechanical aortic valve replacement 11/01/2009   Qualifier: Diagnosis of  By: [name], [name]    . Hiatal hernia   . History of colon polyps 12/23/2011   Overview:  Overview:  Colonoscopy January 2010, 6 mm rectal tubulovillous adenoma. No high-grade dysplasia   . Hyperlipidemia   . Hypertension   . Hypogonadism male 04/08/2012  . Impaired glucose tolerance 10/08/2011  . Morbid obesity ([place]) 04/02/2014  . Myocardial infarction age 54  . OSA (obstructive sleep apnea)    CPAP  . Peripheral  vascular disease ([place])   . 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 1/61/0960   33 mm St Jude bileaflet mechanical valve placed via right mini thoracotomy approach  . 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    1988 - Dr Blase Mess at [facility] in [place], [place]  . Severe mitral regurgitation 10/11/2014  . Testicular cancer [facility])    He was 56 y/o. He had surgical resection and rad tx's.   Marland Kitchen TIA (transient ischemic attack)    age 5  . Varicose veins     Past Surgical History:  Procedure Laterality Date  . aortic truck    . BENTALL PROCEDURE  1988   25 mm St Jude mechanical valve conduit - Dr Blase Mess at [facility] in [place], [place]  . CHOLECYSTECTOMY  2011  . COLONOSCOPY WITH PROPOFOL N/A 06/17/2016   Procedure: COLONOSCOPY WITH PROPOFOL;  Surgeon: Jerene Bears, MD;  Location: [facility] ENDOSCOPY;  Service: Gastroenterology;  Laterality: N/A;  . CORONARY ARTERY BYPASS GRAFT    . LASER ABLATION Left 03/06/2010   leg  . MITRAL VALVE REPLACEMENT Right 01/24/2015   Procedure: Re-Operation, MINIMALLY INVASIVE MITRAL VALVE (MV) REPLACEMENT.;  Surgeon: Rexene Alberts, MD;  Location: [facility];  Service: Open Heart Surgery;  Laterality: Right;  . ORCHIECTOMY  age 49   testicular cancer  . OTOPLASTY     bilateral, age 41  . TEE WITHOUT CARDIOVERSION N/A 10/11/2014   Procedure: TRANSESOPHAGEAL ECHOCARDIOGRAM (TEE);  Surgeon: Pixie Casino, MD;  [facility];  Service: Cardiovascular;  Laterality: N/A;  . TEE WITHOUT CARDIOVERSION N/A 01/24/2015   Procedure: TRANSESOPHAGEAL ECHOCARDIOGRAM (TEE);  Surgeon: Rexene Alberts, MD;  Location: [facility];  Service: Open Heart Surgery;  Laterality: N/A;  . TONSILLECTOMY  1967  . TOOTH EXTRACTION  2016    Current Medications: Outpatient Medications Prior to Visit  Medication Sig Dispense Refill  . acetaminophen (TYLENOL) 500  MG tablet Take 500 mg by mouth every 8 (eight) hours as needed for moderate pain or headache.     . allopurinol (ZYLOPRIM) 300 MG tablet Take 300 mg by mouth daily.    Marland Kitchen atenolol (TENORMIN) 25 MG tablet Take 1 tablet (25 mg total) by mouth 2 (two) times daily.    . bacitracin 500 UNIT/GM ointment Apply 1 application topically as needed for wound care.     . digoxin (LANOXIN) 0.25 MG tablet Take 1 tablet (0.25 mg total) by mouth daily. 90 tablet 3  . furosemide (LASIX) 80 MG tablet Take 80 mg by mouth 2 (two) times daily.    . magnesium oxide (MAG-OX) 400 (241.3 MG) MG tablet Take 1 tablet (400 mg total) by mouth 2 (two) times daily.  180 tablet 3  . methocarbamol (ROBAXIN) 500 MG tablet Take 1 tablet (500 mg total) by mouth 3 (three) times daily. 270 tablet 0  . [START ON 12/25/2016] oxyCODONE (OXY IR/ROXICODONE) 5 MG immediate release tablet Take 1 tablet (5 mg total) by mouth every 8 (eight) hours as needed for severe pain. 90 tablet 0  . potassium chloride (K-DUR) 10 MEQ tablet Take 2 tablets (20 mEq total) by mouth 2 (two) times daily. 360 tablet 1  . temazepam (RESTORIL) 15 MG capsule Take 15 mg by mouth at bedtime as needed for sleep.    Marland Kitchen warfarin (COUMADIN) 5 MG tablet TAKE 1 TO 1 AND 1/2 TABLETS DAILY AS DIRECTED BY COUMADIN CLINIC (Patient taking differently: Take 5 mg by mouth on Monday, Wednesday and Friday. Take 7.5 mg by mouth on all other days) 135 tablet 0   No facility-administered medications prior to visit.      Allergies:   Penicillins; Adhesive [tape]; and Bactrim [sulfamethoxazole-trimethoprim]   Social History   Social History  . Marital status: Married    Spouse name: N/A  . Number of children: 0  . Years of education: N/A   Occupational History  . Disabled Optician Unemployed   Social History Main Topics  . Smoking status: Light Tobacco Smoker    Types: Cigars  . Smokeless tobacco: Never Used     Comment: about 3 yearly- cigar  . Alcohol use 0.0 oz/week      Comment: social  . Drug use: No  . Sexual activity: Not Asked   Other Topics Concern  . None   Social History Narrative  . None     Family History:  The patient's family history includes Cancer in his maternal aunt and maternal uncle; Diabetes in his father; Heart disease in his mother; Hypertension in his other; Other in his mother; Stroke in his other; Throat cancer in his mother.   Review of Systems:   Please see the history of present illness.     General:  No chills, fever, night sweats or weight changes.  Cardiovascular:  No chest pain, dyspnea on exertion, orthopnea, palpitations, paroxysmal nocturnal dyspnea. Positive for lower extremity edema.  Dermatological: No rash, lesions/masses Respiratory: No cough, dyspnea Urologic: No hematuria, dysuria Abdominal:   No nausea, vomiting, diarrhea, bright red blood per rectum, melena, or hematemesis Neurologic:  No visual changes, wkns, changes in mental status. All other systems reviewed and are otherwise negative except as noted above.   Physical Exam:    VS:  BP 130/66   Pulse 70   Ht 5' 1.5" (1.562 m)   Wt 214 lb (97.1 kg)   BMI 39.78 kg/m    General: Well developed, well nourished Caucasian male appearing in no acute distress. Head: Normocephalic, atraumatic, sclera non-icteric, no xanthomas, nares are without discharge.  Neck: No carotid bruits. JVD not elevated.  Lungs: Respirations regular and unlabored, without wheezes or rales.  Heart: Irregularly irregular. No S3 or S4.  No murmur, no rubs, or gallops appreciated. Crisp mechanical valve sounds appreciated.  Abdomen: Soft, non-tender, non-distended with normoactive bowel sounds. No hepatomegaly. No rebound/guarding. No obvious abdominal masses. Msk:  Strength and tone appear normal for age. No joint deformities or effusions. Extremities: No clubbing or cyanosis. Trace lower extremity edema. Venous stasis.  Distal pedal pulses are 2+ bilaterally. Neuro: Alert and  oriented X 3. Moves all extremities spontaneously. No focal deficits noted. Psych:  Responds to questions appropriately with a normal affect. Skin: No rashes  or lesions noted  Wt Readings from Last 3 Encounters:  12/05/16 214 lb (97.1 kg)  11/28/16 226 lb (102.5 kg)  10/17/16 208 lb (94.3 kg)     Studies/Labs Reviewed:   EKG:  EKG is not ordered today.  Recent Labs: 02/05/2016: ALT 19 08/27/2016: B Natriuretic Peptide 260.5 09/03/2016: Magnesium 1.8; Platelets 127 09/05/2016: Hemoglobin 10.9 12/03/2016: BUN 18; Creatinine, Ser 0.75; Potassium 4.1; Sodium 139   Lipid Panel    Component Value Date/Time   CHOL 118 08/18/2012 1137   TRIG 133.0 08/18/2012 1137   HDL 27.20 (L) 08/18/2012 1137   CHOLHDL 4 08/18/2012 1137   VLDL 26.6 08/18/2012 1137   LDLCALC 64 08/18/2012 1137    Additional studies/ records that were reviewed today include:   Echocardiogram: 12/2015 Study Conclusions  - Left ventricle: The cavity size was normal. There was severe   concentric hypertrophy. Systolic function was vigorous. The   estimated ejection fraction was in the range of 65% to 70%. Wall   motion was normal; there were no regional wall motion   abnormalities. The study is not technically sufficient to allow   evaluation of LV diastolic function. - Aortic valve: A mechanical prosthesis was present and functioning   normally. Peak velocity (S): 345 cm/s. Mean gradient (S): 28 mm   Hg. (Consistent with moderate aortic stenosis gradient) - Aorta: Post Bentall with ST Jude Mechanical Valve in 1988. Aortic   arch diameter: 40 mm. - Aortic arch: The aortic arch was mildly dilated. - Mitral valve: A mechanical prosthesis was present and functioning   normally. - Left atrium: The atrium was severely dilated. - Right ventricle: The cavity size was mildly dilated. Wall   thickness was normal. - Right atrium: The atrium was moderately dilated. - Atrial septum: The septum bowed from left to right,  consistent   with increased left atrial pressure. - Tricuspid valve: There was moderate regurgitation. - Pulmonary arteries: Systolic pressure was moderately to severely   increased. PA peak pressure: 67 mm Hg (S).  Impressions:  - There was no evidence of a vegetation.   Assessment:    1. Chronic diastolic congestive heart failure ([place])   2. H/O mechanical aortic valve replacement   3. Persistent atrial fibrillation ([place])   4. Coronary artery disease involving coronary bypass graft of native heart without angina pectoris   5. OSA (obstructive sleep apnea)      Plan:   In order of problems listed above:  1. Chronic Diastolic CHF - echo in 57/8469 showed a preserved EF of 65-70%. He was seen in the clinic on 11/28/2016 with worsening edema and weight up to 226 lbs (at 208 - 212 lbs in 10/2016). Reported consuming high-sodium foods and skipping doses of his Lasix.  He was continued on Lasix 80mg  BID with an additional 40mg  daily added to his regimen.  - today, he reports being compliant with a low-sodium diet and his Lasix dosing. Has been taking 100mg  in AM and 100mg  in PM as his schedule does not allow for a mid-afternoon dose. Weight is down to 214 lbs (close to baseline of 212 -213 lbs).  - BMET checked on 4/4 and showed K+ of 4.1 and creatinine at 0.75. Will continue on Lasix 80mg  BID. To take an additional 40mg  if weight above baseline or > 3 lb weight gain overnight.    2. History of Mechanical Valve - s/b Bentall procedure in 1988 and severe MR with placement of a St. Jude mechanical valve  in 12/2014. Continue Coumadin (followed by our clinic).   3. Persistent Atrial Fibrillation - HR remains well-controlled on Atenolol and Digoxin.  - This patients CHA2DS2-VASc Score and unadjusted Ischemic Stroke Rate (% per year) is equal to 2.2 % stroke rate/year from a score of 2 (HTN, Vascular). He denies any evidence of active bleeding. Continue Coumadin for anticoagulation.   4.  CAD - s/p SVG-RCA, occluded by cath in 12/2014. - he denies any recent anginal symptoms.  - continue BB. Not on ASA secondary to need for Coumadin.   5. OSA - continue CPAP.   Medication Adjustments/Labs and Tests Ordered: Current medicines are reviewed at length with the patient today.  Concerns regarding medicines are outlined above.  Medication changes, Labs and Tests ordered today are listed in the Patient Instructions below. Patient Instructions  Medication Instructions:  Continue lasix 80mg  two times daily  Take extra 40mg  lasix daily for weight gain >3lbs  Follow-Up: Your physician recommends that you schedule a follow-up appointment in: [place] with Dr. [name].  Any Other Special Instructions Will Be Listed Below (If Applicable).  BASELINE WEIGHT should be 212-213 lbs  If you need a refill on your cardiac medications before your next appointment, please call your pharmacy.  Signed, Erma Heritage, PA-C  12/05/2016 2:43 PM    [place], [place], [place]  [address] Phone: [phone]; Fax: [phone]  [address], [place], [place] [address] Phone: [phone]

## 2016-12-05 ENCOUNTER — Ambulatory Visit (INDEPENDENT_AMBULATORY_CARE_PROVIDER_SITE_OTHER): Payer: Medicare HMO | Admitting: Student

## 2016-12-05 ENCOUNTER — Encounter: Payer: Self-pay | Admitting: Student

## 2016-12-05 VITALS — BP 130/66 | HR 70 | Ht 61.5 in | Wt 214.0 lb

## 2016-12-05 DIAGNOSIS — Z952 Presence of prosthetic heart valve: Secondary | ICD-10-CM | POA: Diagnosis not present

## 2016-12-05 DIAGNOSIS — I4819 Other persistent atrial fibrillation: Secondary | ICD-10-CM

## 2016-12-05 DIAGNOSIS — G4733 Obstructive sleep apnea (adult) (pediatric): Secondary | ICD-10-CM

## 2016-12-05 DIAGNOSIS — I5032 Chronic diastolic (congestive) heart failure: Secondary | ICD-10-CM | POA: Diagnosis not present

## 2016-12-05 DIAGNOSIS — I481 Persistent atrial fibrillation: Secondary | ICD-10-CM

## 2016-12-05 DIAGNOSIS — I25810 Atherosclerosis of coronary artery bypass graft(s) without angina pectoris: Secondary | ICD-10-CM | POA: Diagnosis not present

## 2016-12-05 NOTE — Patient Instructions (Addendum)
Medication Instructions:  Continue lasix 80mg  two times daily  Take extra 40mg  lasix daily for weight gain >3lbs   Follow-Up: Your physician recommends that you schedule a follow-up appointment in: [place] with Dr. [name].   Any Other Special Instructions Will Be Listed Below (If Applicable).  BASELINE WEIGHT should be 212-213 lbs  If you need a refill on your cardiac medications before your next appointment, please call your pharmacy.

## 2016-12-22 ENCOUNTER — Encounter: Payer: Self-pay | Admitting: Nurse Practitioner

## 2016-12-22 ENCOUNTER — Ambulatory Visit: Payer: Medicare HMO | Attending: Nurse Practitioner | Admitting: Nurse Practitioner

## 2016-12-22 VITALS — BP 122/62 | HR 70 | Temp 98.0°F | Resp 16 | Ht 61.0 in | Wt 216.0 lb

## 2016-12-22 DIAGNOSIS — M791 Myalgia: Secondary | ICD-10-CM | POA: Insufficient documentation

## 2016-12-22 DIAGNOSIS — Z8249 Family history of ischemic heart disease and other diseases of the circulatory system: Secondary | ICD-10-CM | POA: Insufficient documentation

## 2016-12-22 DIAGNOSIS — I872 Venous insufficiency (chronic) (peripheral): Secondary | ICD-10-CM | POA: Diagnosis not present

## 2016-12-22 DIAGNOSIS — Z9049 Acquired absence of other specified parts of digestive tract: Secondary | ICD-10-CM | POA: Insufficient documentation

## 2016-12-22 DIAGNOSIS — Z6841 Body Mass Index (BMI) 40.0 and over, adult: Secondary | ICD-10-CM | POA: Insufficient documentation

## 2016-12-22 DIAGNOSIS — Z79891 Long term (current) use of opiate analgesic: Secondary | ICD-10-CM | POA: Insufficient documentation

## 2016-12-22 DIAGNOSIS — Z8673 Personal history of transient ischemic attack (TIA), and cerebral infarction without residual deficits: Secondary | ICD-10-CM | POA: Insufficient documentation

## 2016-12-22 DIAGNOSIS — G894 Chronic pain syndrome: Secondary | ICD-10-CM | POA: Insufficient documentation

## 2016-12-22 DIAGNOSIS — E119 Type 2 diabetes mellitus without complications: Secondary | ICD-10-CM | POA: Insufficient documentation

## 2016-12-22 DIAGNOSIS — M48062 Spinal stenosis, lumbar region with neurogenic claudication: Secondary | ICD-10-CM | POA: Insufficient documentation

## 2016-12-22 DIAGNOSIS — F17290 Nicotine dependence, other tobacco product, uncomplicated: Secondary | ICD-10-CM | POA: Diagnosis not present

## 2016-12-22 DIAGNOSIS — Z880 Allergy status to penicillin: Secondary | ICD-10-CM | POA: Insufficient documentation

## 2016-12-22 DIAGNOSIS — I2510 Atherosclerotic heart disease of native coronary artery without angina pectoris: Secondary | ICD-10-CM | POA: Diagnosis not present

## 2016-12-22 DIAGNOSIS — I219 Acute myocardial infarction, unspecified: Secondary | ICD-10-CM | POA: Insufficient documentation

## 2016-12-22 DIAGNOSIS — M47816 Spondylosis without myelopathy or radiculopathy, lumbar region: Secondary | ICD-10-CM

## 2016-12-22 DIAGNOSIS — Z7901 Long term (current) use of anticoagulants: Secondary | ICD-10-CM | POA: Insufficient documentation

## 2016-12-22 DIAGNOSIS — I110 Hypertensive heart disease with heart failure: Secondary | ICD-10-CM | POA: Insufficient documentation

## 2016-12-22 DIAGNOSIS — Z9079 Acquired absence of other genital organ(s): Secondary | ICD-10-CM | POA: Insufficient documentation

## 2016-12-22 DIAGNOSIS — I482 Chronic atrial fibrillation: Secondary | ICD-10-CM | POA: Diagnosis not present

## 2016-12-22 DIAGNOSIS — Z8547 Personal history of malignant neoplasm of testis: Secondary | ICD-10-CM | POA: Insufficient documentation

## 2016-12-22 DIAGNOSIS — M48061 Spinal stenosis, lumbar region without neurogenic claudication: Secondary | ICD-10-CM | POA: Diagnosis not present

## 2016-12-22 DIAGNOSIS — M4696 Unspecified inflammatory spondylopathy, lumbar region: Secondary | ICD-10-CM | POA: Insufficient documentation

## 2016-12-22 DIAGNOSIS — I5033 Acute on chronic diastolic (congestive) heart failure: Secondary | ICD-10-CM | POA: Diagnosis not present

## 2016-12-22 DIAGNOSIS — D509 Iron deficiency anemia, unspecified: Secondary | ICD-10-CM | POA: Diagnosis not present

## 2016-12-22 DIAGNOSIS — G4733 Obstructive sleep apnea (adult) (pediatric): Secondary | ICD-10-CM | POA: Diagnosis not present

## 2016-12-22 DIAGNOSIS — Z951 Presence of aortocoronary bypass graft: Secondary | ICD-10-CM | POA: Insufficient documentation

## 2016-12-22 DIAGNOSIS — Z9889 Other specified postprocedural states: Secondary | ICD-10-CM | POA: Insufficient documentation

## 2016-12-22 DIAGNOSIS — Z952 Presence of prosthetic heart valve: Secondary | ICD-10-CM | POA: Insufficient documentation

## 2016-12-22 DIAGNOSIS — Z823 Family history of stroke: Secondary | ICD-10-CM | POA: Insufficient documentation

## 2016-12-22 DIAGNOSIS — I252 Old myocardial infarction: Secondary | ICD-10-CM | POA: Diagnosis not present

## 2016-12-22 DIAGNOSIS — I340 Nonrheumatic mitral (valve) insufficiency: Secondary | ICD-10-CM | POA: Diagnosis not present

## 2016-12-22 DIAGNOSIS — E785 Hyperlipidemia, unspecified: Secondary | ICD-10-CM | POA: Insufficient documentation

## 2016-12-22 DIAGNOSIS — Z800 Family history of malignant neoplasm of digestive organs: Secondary | ICD-10-CM | POA: Insufficient documentation

## 2016-12-22 DIAGNOSIS — G510 Bell's palsy: Secondary | ICD-10-CM | POA: Diagnosis not present

## 2016-12-22 DIAGNOSIS — Z86010 Personal history of colonic polyps: Secondary | ICD-10-CM | POA: Insufficient documentation

## 2016-12-22 DIAGNOSIS — Z833 Family history of diabetes mellitus: Secondary | ICD-10-CM | POA: Insufficient documentation

## 2016-12-22 DIAGNOSIS — M109 Gout, unspecified: Secondary | ICD-10-CM | POA: Diagnosis not present

## 2016-12-22 DIAGNOSIS — M7918 Myalgia, other site: Secondary | ICD-10-CM

## 2016-12-22 MED ORDER — OXYCODONE HCL 5 MG PO TABS: 5.0000 mg | ORAL_TABLET | Freq: Three times a day (TID) | ORAL | 0 refills | Status: DC | PRN

## 2016-12-22 MED ORDER — METHOCARBAMOL 500 MG PO TABS: 500.0000 mg | ORAL_TABLET | Freq: Three times a day (TID) | ORAL | 0 refills | Status: DC

## 2016-12-22 MED ORDER — OXYCODONE HCL 5 MG PO TABS
5.0000 mg | ORAL_TABLET | Freq: Three times a day (TID) | ORAL | 0 refills | Status: DC | PRN
Start: 2017-02-23 — End: 2017-02-25

## 2016-12-22 NOTE — Progress Notes (Deleted)
Patient's Name: Lance Cook  MRN: [id number]  Referring Provider: [name], PA-C  DOB: [birth year]  PCP: [name], PA-C  DOS: 12/22/2016  Note by: Vevelyn Francois NP  Service setting: Ambulatory outpatient  Specialty: Interventional Pain Management  Location: [facility] (AMB) Pain Management Facility    Patient type: Established    Primary Reason(s) for Visit: Encounter for prescription drug management (Level of risk: moderate) CC: Back Pain (lower back, L4,L5)  HPI  Mr. Hagin is a 56 y.o. year old, male patient, who comes today for a medication management evaluation. 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; Ascending aortic dissection ([place]); Chronic diastolic congestive heart failure (HCC); 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; Enteritis due to Clostridium difficile; Hypomagnesemia; Physical deconditioning; Long term current use of opiate analgesic; Long term prescription opiate use; Opiate use (22.5 MME/day); Opiate dependence ([place]); Encounter for therapeutic drug level monitoring; Chronic low back pain (midline); Lumbar facet syndrome; Personal history of transient ischemic attack (TIA), and cerebral infarction without residual deficits; ED (erectile dysfunction) of organic origin; Eunuchoidism; Adult BMI 30+; Obstructive apnea; Testicular hypofunction; Lumbar canal stenosis; Malignant neoplasm of testis  ([place]); Chronic lower extremity pain (Left); Personal history of malignant neoplasm of testis; Musculoskeletal pain; History of colonic polyps; Chronic diarrhea; Benign neoplasm of transverse colon; Scrotal swelling; Chronic pain syndrome; Acute on chronic diastolic CHF (congestive heart failure) (HCC); S/P MVR (mitral valve replacement); H/O TIA (transient ischemic attack) and stroke; Heart valve replaced by other means; HTN (hypertension); Low back pain; Lymphadenopathy; Male hypogonadism; Obesity (BMI 30-39.9); Obstructive sleep apnea; Other testicular hypofunction; Presence of other heart-valve replacement; S/P AVR (aortic valve replacement); Spinal stenosis of lumbar region without neurogenic claudication; Spondylosis; Type 2 diabetes mellitus with other specified complication ([place]); Type II or unspecified type diabetes mellitus with other specified manifestations, not stated as uncontrolled; and Leg swelling on his problem list. His primarily concern today is the Back Pain (lower back, L4,L5)  Pain Assessment: Self-Reported Pain Score: 6 /10             Reported level is compatible with observation.       Pain Type: Chronic pain Pain Location: Back Pain Orientation: Lower Pain Descriptors / Indicators: Sharp, Pressure, Jabbing, Constant Pain Frequency: Constant  Mr. Cordrey was last scheduled for an appointment on Visit date not found for medication management. During today's appointment we reviewed Mr. Salvador's chronic pain status, as well as his outpatient medication regimen.  The patient  reports that he does not use drugs. His body mass index is 40.81 kg/m.  Further details on both, my assessment(s), as well as the proposed treatment plan, please see below.  Controlled Substance Pharmacotherapy Assessment REMS (Risk Evaluation and Mitigation Strategy)  Analgesic: *** MME/day: *** mg/day.  [name], RN  12/22/2016  1:28 PM  Sign at close encounter Nursing Pain Medication  Assessment:  Safety precautions to be maintained throughout the outpatient stay will include: orient to surroundings, keep bed in low position, maintain call bell within reach at all times, provide assistance with transfer out  of bed and ambulation.  Medication Inspection Compliance: Mr. Styer did not comply with our request to bring his pills to be counted. He was reminded that bringing the medication bottles, even when empty, is a requirement.  Medication: None brought in. Pill/Patch Count: None available to be counted. Bottle Appearance: No container available. Did not bring bottle(s) to appointment. Filled Date: N/A Last Medication intake:  Today Pharmacokinetics: Liberation and absorption (onset of action): WNL Distribution (time to peak effect): WNL Metabolism and excretion (duration of action): WNL         Pharmacodynamics: Desired effects: Analgesia: Mr. Blowe reports >50% benefit. Functional ability: Patient reports that medication allows him to accomplish basic ADLs Clinically meaningful improvement in function (CMIF): Sustained CMIF goals met Perceived effectiveness: Described as relatively effective, allowing for increase in activities of daily living (ADL) Undesirable effects: Side-effects or Adverse reactions: None reported Monitoring: [place] PMP: Online review of the past 34-monthperiod conducted. Compliant with practice rules and regulations List of all UDS test(s) done:  Lab Results  Component Value Date   TOXASSSELUR FINAL 01/07/2016   TOXASSSELUR FINAL 10/09/2015   TOXASSSELUR FINAL 07/11/2015   Last UDS on record: ToxAssure Select 13  Date Value Ref Range Status  01/07/2016 FINAL  Final    Comment:    ==================================================================== TOXASSURE SELECT 13 (MW) ==================================================================== Test                             Result       Flag       Units Drug Present and Declared for  Prescription Verification   Oxazepam                       782          EXPECTED   ng/mg creat   Temazepam                      10809        EXPECTED   ng/mg creat    Oxazepam and temazepam are expected metabolites of diazepam.    Oxazepam is also an expected metabolite of other benzodiazepine    drugs, including chlordiazepoxide, prazepam, clorazepate,    halazepam, and temazepam.  Oxazepam and temazepam are available    as scheduled prescription medications.   Oxycodone                      1568         EXPECTED   ng/mg creat   Oxymorphone                    305          EXPECTED   ng/mg creat   Noroxycodone                   5650         EXPECTED   ng/mg creat    Sources of oxycodone include scheduled prescription medications.    Oxymorphone and noroxycodone are expected metabolites of    oxycodone. Oxymorphone is also available as a scheduled    prescription medication. ==================================================================== Test                      Result    Flag   Units      Ref Range   Creatinine  22               mg/dL      >=20 ==================================================================== Declared Medications:  The flagging and interpretation on this report are based on the  following declared medications.  Unexpected results may arise from  inaccuracies in the declared medications.  **Note: The testing scope of this panel includes these medications:  Oxycodone (Oxy-IR)  Temazepam (Restoril)  **Note: The testing scope of this panel does not include following  reported medications:  Allopurinol (Zyloprim)  Atenolol (Tenormin)  Calcium (Calcium/Magnesium/Zinc)  Clindamycin (Cleocin)  Furosemide (Lasix)  Magnesium (Calcium/Magnesium/Zinc)  Magnesium (Mag-Ox)  Methocarbamol (Robaxin)  Potassium (K-Dur)  Topical  Topical (Silvadene)  Vitamin B  Warfarin (Coumadin)  Zinc  (Calcium/Magnesium/Zinc) ==================================================================== For clinical consultation, please call [phone]. ====================================================================    UDS interpretation: Compliant          Medication Assessment Form: Reviewed. Patient indicates being compliant with therapy Treatment compliance: Compliant Risk Assessment Profile: Aberrant behavior: See prior evaluations. None observed or detected today Comorbid factors increasing risk of overdose: See prior notes. No additional risks detected today Risk of substance use disorder (SUD): Low Opioid Risk Tool (ORT) Total Score: 0  Interpretation Table:  Score <3 = Low Risk for SUD  Score between 4-7 = Moderate Risk for SUD  Score >8 = High Risk for Opioid Abuse   Risk Mitigation Strategies:  Patient Counseling: Covered Patient-Prescriber Agreement (PPA): Present and active  Notification to other healthcare providers: Done  Pharmacologic Plan: No change in therapy, at this time  Laboratory Chemistry  Inflammation Markers Lab Results  Component Value Date   CRP 0.9 10/09/2015   ESRSEDRATE 9 01/14/2016   (CRP: Acute Phase) (ESR: Chronic Phase) Renal Function Markers Lab Results  Component Value Date   BUN 18 12/03/2016   CREATININE 0.75 (L) 12/03/2016   GFRAA 119 12/03/2016   GFRNONAA 103 12/03/2016   Hepatic Function Markers Lab Results  Component Value Date   AST 21 02/05/2016   ALT 19 02/05/2016   ALBUMIN 2.6 (L) 02/05/2016   ALKPHOS 55 02/05/2016   Electrolytes Lab Results  Component Value Date   NA 139 12/03/2016   K 4.1 12/03/2016   CL 95 (L) 12/03/2016   CALCIUM 8.5 (L) 12/03/2016   MG 1.8 09/03/2016   Neuropathy Markers Lab Results  Component Value Date   VITAMINB12 371 01/07/2012   Bone Pathology Markers Lab Results  Component Value Date   ALKPHOS 55 02/05/2016   CALCIUM 8.5 (L) 12/03/2016   TESTOFREE 49.5 03/22/2012    TESTOSTERONE 206.14 (L) 08/18/2012   Coagulation Parameters Lab Results  Component Value Date   INR 1.8 12/03/2016   LABPROT 33.3 (H) 09/03/2016   APTT 41 (H) 01/24/2015   PLT 127 (L) 09/03/2016   Cardiovascular Markers Lab Results  Component Value Date   BNP 260.5 (H) 08/27/2016   HGB 10.9 (L) 09/05/2016   HCT 32.0 (L) 09/05/2016   Note: Lab results reviewed.  Recent Diagnostic Imaging Review  Ct Soft Tissue Neck W Contrast  Result Date: 10/06/2016 CLINICAL DATA:  Left vocal cord paralysis. Prior left parotid resection. EXAM: CT NECK WITH CONTRAST TECHNIQUE: Multidetector CT imaging of the neck was performed using the standard protocol following the bolus administration of intravenous contrast. CONTRAST:  29m ISOVUE-300 IOPAMIDOL (ISOVUE-300) INJECTION 61% COMPARISON:  08/18/2012 neck CT.  11/21/2014 chest CTA. FINDINGS: Pharynx and larynx: No pharyngeal mass or swelling. There is motion artifact through the larynx which limits assessment. There is  suggestion of asymmetric medial ization of the left vocal cord with some thickening of the left AE folds which would be compatible with the provided history of left vocal cord paralysis. No gross laryngeal mass is identified. Salivary glands: Submandibular glands and right parotid gland are unremarkable. Sequelae of interval left parotidectomy are identified with resection of the lipomatous mass extending inferiorly superficial to the sternocleidomastoid muscle on the prior study. Thyroid: Mild diffuse enlargement, nodularity, and heterogeneity of the thyroid without dominant mass identified. Lymph nodes: There is an enlarged 11 mm short axis left supraclavicular lymph node which has increased in size from 2013 (series 3, image 76, previously 6 mm). Additional subcentimeter left supraclavicular and left thoracic inlet lymph nodes have also mildly enlarged. Right paratracheal lymph nodes near the thoracic inlet have mildly enlarged and measure up to  11 mm in short axis (series 3, image 81). Vascular: Major vascular structures of the neck are patent. Limited intracranial: Unremarkable. Visualized orbits: Not imaged. Mastoids and visualized paranasal sinuses: Unchanged mucous retention cyst or polyp inferiorly in the right maxillary sinus. Minimal left maxillary sinus mucosal thickening. Clear mastoid air cells. Skeleton: Large anterior vertebral osteophytes C4 to T1. Bilateral facet ankylosis at C2-3 and C7-T1. Partial ankylosis across the C2-3 disc space as well. Upper chest: Numerous subcentimeter short axis peritracheal, prevascular, and precarinal lymph nodes are similar to the 2016 chest CTA. Chronic short-segment dissection in the proximal ascending aorta was incompletely imaged and is also partially obscured by motion artifact. Small pleural effusions are partially visualized. Unchanged small left upper lobe lung nodules from 2016, the largest of which measures 3 mm and is calcified. Other: None. IMPRESSION: 1. Mild left supraclavicular and bilateral thoracic inlet lymphadenopathy, increased from 2013 and of indeterminate etiology. 2. Numerous subcentimeter mediastinal lymph nodes, unchanged from 2016. 3. No primary neck mass. 4. Limited assessment of the larynx due to motion. 5. Mildly enlarged and diffusely nodular thyroid likely reflecting goiter. Electronically Signed   By: Logan Bores M.D.   On: 10/06/2016 15:21   Note: Imaging results reviewed.          Meds  The patient has a current medication list which includes the following prescription(s): acetaminophen, allopurinol, atenolol, bacitracin, digoxin, furosemide, magnesium oxide, methocarbamol, oxycodone, potassium chloride, temazepam, and warfarin.  Current Outpatient Prescriptions on File Prior to Visit  Medication Sig  . acetaminophen (TYLENOL) 500 MG tablet Take 500 mg by mouth every 8 (eight) hours as needed for moderate pain or headache.   . allopurinol (ZYLOPRIM) 300 MG tablet  Take 300 mg by mouth daily.  Marland Kitchen atenolol (TENORMIN) 25 MG tablet Take 1 tablet (25 mg total) by mouth 2 (two) times daily.  . bacitracin 500 UNIT/GM ointment Apply 1 application topically as needed for wound care.   . digoxin (LANOXIN) 0.25 MG tablet Take 1 tablet (0.25 mg total) by mouth daily.  . furosemide (LASIX) 80 MG tablet Take 80 mg by mouth 2 (two) times daily.  . magnesium oxide (MAG-OX) 400 (241.3 MG) MG tablet Take 1 tablet (400 mg total) by mouth 2 (two) times daily.  . methocarbamol (ROBAXIN) 500 MG tablet Take 1 tablet (500 mg total) by mouth 3 (three) times daily.  Derrill Memo ON 12/25/2016] oxyCODONE (OXY IR/ROXICODONE) 5 MG immediate release tablet Take 1 tablet (5 mg total) by mouth every 8 (eight) hours as needed for severe pain.  . potassium chloride (K-DUR) 10 MEQ tablet Take 2 tablets (20 mEq total) by mouth 2 (two) times daily.  Marland Kitchen  temazepam (RESTORIL) 15 MG capsule Take 15 mg by mouth at bedtime as needed for sleep.  Marland Kitchen warfarin (COUMADIN) 5 MG tablet TAKE 1 TO 1 AND 1/2 TABLETS DAILY AS DIRECTED BY COUMADIN CLINIC (Patient taking differently: Take 5 mg by mouth on Monday, Wednesday and Friday. Take 7.5 mg by mouth on all other days)   No current facility-administered medications on file prior to visit.    ROS  Constitutional: Denies any fever or chills Gastrointestinal: No reported hemesis, hematochezia, vomiting, or acute GI distress Musculoskeletal: Denies any acute onset joint swelling, redness, loss of ROM, or weakness Neurological: No reported episodes of acute onset apraxia, aphasia, dysarthria, agnosia, amnesia, paralysis, loss of coordination, or loss of consciousness  Allergies  Mr. Genter is allergic to penicillins; adhesive [tape]; and bactrim [sulfamethoxazole-trimethoprim].  [place]  Drug: Mr. Dhaliwal  reports that he does not use drugs. Alcohol:  reports that he drinks alcohol. Tobacco:  reports that he has been smoking Cigars.  He has never used smokeless  tobacco. Medical:  has a past medical history of Anemia; Anxiety; ARDS (adult respiratory distress syndrome) ([place]) (01/27/2015); Arthritis; Ascending aortic dissection ([place]) (07/14/2008); Asymptomatic chronic venous hypertension (01/15/2010); Atrial fibrillation ([place]); Bell's palsy; CAD (coronary artery disease); Cellulitis (04/02/2014); Cerebral artery occlusion with cerebral infarction ([place]) (10/08/2011); Chronic diastolic heart failure ([place]) (03/01/2015); Chronic LBP (10/08/2011); CVA (cerebral vascular accident) ([place]) (10/08/2011); DIABETES MELLITUS, TYPE II (11/01/2009); Diverticulosis; 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); Encephalopathy acute (02/05/2015); GERD (gastroesophageal reflux disease); Gout; H/O mechanical aortic valve replacement (11/01/2009); Hiatal hernia; History of colon polyps (12/23/2011); Hyperlipidemia; Hypertension; Hypogonadism male (04/08/2012); Impaired glucose tolerance (10/08/2011); Morbid obesity ([place]) (04/02/2014); Myocardial infarction [facility]) (age 36); OSA (obstructive sleep apnea); Peripheral vascular disease ([place]); 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 (2/76/1470); 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 Family: family history includes Cancer in his maternal aunt and maternal uncle; Diabetes in his father; Heart disease in his mother; Hypertension in his other; Other in his mother; Stroke in his other; Throat cancer in his mother.  Past Surgical History:  Procedure Laterality Date  . aortic truck    . BENTALL PROCEDURE  1988   25 mm St Jude mechanical valve conduit - Dr Blase Mess at [facility] in [place], [place]  . CHOLECYSTECTOMY  2011  . COLONOSCOPY WITH PROPOFOL N/A 06/17/2016   Procedure: COLONOSCOPY WITH PROPOFOL;  Surgeon: Jerene Bears, MD;  Location: [facility] ENDOSCOPY;  Service: Gastroenterology;  Laterality: N/A;  . CORONARY ARTERY BYPASS GRAFT    . LASER ABLATION Left 03/06/2010   leg  . MITRAL VALVE REPLACEMENT Right 01/24/2015   Procedure: Re-Operation, MINIMALLY INVASIVE MITRAL VALVE (MV) REPLACEMENT.;  Surgeon: Rexene Alberts, MD;  Location: [place];  Service: Open Heart Surgery;  Laterality: Right;  . ORCHIECTOMY  age 77   testicular cancer  . OTOPLASTY     bilateral, age 40  . TEE WITHOUT CARDIOVERSION N/A 10/11/2014   Procedure: TRANSESOPHAGEAL ECHOCARDIOGRAM (TEE);  Surgeon: Pixie Casino, MD;  Location: [facility] ENDOSCOPY;  Service: Cardiovascular;  Laterality: N/A;  . TEE WITHOUT CARDIOVERSION N/A 01/24/2015   Procedure: TRANSESOPHAGEAL ECHOCARDIOGRAM (TEE);  Surgeon: Rexene Alberts, MD;  Location: [place];  Service: Open Heart Surgery;  Laterality: N/A;  . TONSILLECTOMY  1967  . TOOTH EXTRACTION  2016  Constitutional Exam  General appearance: Well nourished, well developed, and well hydrated. In no apparent acute distress Vitals:   12/22/16 1320  BP: 122/62  Pulse: 70  Resp: 16  Temp: 98 F (36.7 C)  TempSrc: Oral  SpO2: 100%  Weight: 216 lb (98 kg)  Height: _0  (1.549 m)   BMI Assessment: Estimated body mass index is 40.81 kg/m as calculated from the following:   Height as of this encounter: _1  (1.549 m).   Weight as of this encounter: 216 lb (98 kg).  BMI interpretation table: BMI level Category Range association with higher incidence of chronic pain  <18 kg/m2 Underweight   18.5-24.9 kg/m2 Ideal body weight   25-29.9 kg/m2 Overweight Increased incidence by 20%  30-34.9 kg/m2 Obese (Class I) Increased incidence by 68%  35-39.9 kg/m2 Severe obesity (Class II) Increased incidence by 136%  >40 kg/m2 Extreme obesity (Class III) Increased incidence by 254%   BMI Readings from Last 4 Encounters:  12/22/16 40.81 kg/m  12/05/16 39.78 kg/m  11/28/16 42.70 kg/m  10/17/16 38.66 kg/m   Wt  Readings from Last 4 Encounters:  12/22/16 216 lb (98 kg)  12/05/16 214 lb (97.1 kg)  11/28/16 226 lb (102.5 kg)  10/17/16 208 lb (94.3 kg)  Psych/Mental status: Alert, oriented x 3 (person, place, & time)       Eyes: PERLA Respiratory: No evidence of acute respiratory distress  Cervical Spine Exam  Inspection: No masses, redness, or swelling Alignment: Symmetrical Functional ROM: Unrestricted ROM      Stability: No instability detected Muscle strength & Tone: Functionally intact Sensory: Unimpaired Palpation: No palpable anomalies              Upper Extremity (UE) Exam    Side: Right upper extremity  Side: Left upper extremity  Inspection: No masses, redness, swelling, or asymmetry. No contractures  Inspection: No masses, redness, swelling, or asymmetry. No contractures  Functional ROM: Unrestricted ROM          Functional ROM: Unrestricted ROM          Muscle strength & Tone: Functionally intact  Muscle strength & Tone: Functionally intact  Sensory: Unimpaired  Sensory: Unimpaired  Palpation: No palpable anomalies              Palpation: No palpable anomalies              Specialized Test(s): Deferred         Specialized Test(s): Deferred          Thoracic Spine Exam  Inspection: No masses, redness, or swelling Alignment: Symmetrical Functional ROM: Unrestricted ROM Stability: No instability detected Sensory: Unimpaired Muscle strength & Tone: No palpable anomalies  Lumbar Spine Exam  Inspection: No masses, redness, or swelling Alignment: Symmetrical Functional ROM: Unrestricted ROM      Stability: No instability detected Muscle strength & Tone: Functionally intact Sensory: Unimpaired Palpation: No palpable anomalies       Provocative Tests: Lumbar Hyperextension and rotation test: evaluation deferred today       Patrick's Maneuver: evaluation deferred today                    Gait & Posture Assessment  Ambulation: Unassisted Gait: Relatively normal for age and body  habitus Posture: WNL   Lower Extremity Exam    Side: Right lower extremity  Side: Left lower extremity  Inspection: No masses, redness, swelling, or asymmetry. No contractures  Inspection: No masses, redness, swelling, or asymmetry.  No contractures  Functional ROM: Unrestricted ROM          Functional ROM: Unrestricted ROM          Muscle strength & Tone: Functionally intact  Muscle strength & Tone: Functionally intact  Sensory: Unimpaired  Sensory: Unimpaired  Palpation: No palpable anomalies  Palpation: No palpable anomalies   Assessment  Primary Diagnosis & Pertinent Problem List: The primary encounter diagnosis was Chronic pain syndrome. Diagnoses of Long term current use of opiate analgesic and Musculoskeletal pain were also pertinent to this visit.  Status Diagnosis  Controlled Controlled Controlled 1. Chronic pain syndrome   2. Long term current use of opiate analgesic   3. Musculoskeletal pain      Plan of Care  Pharmacotherapy (Medications Ordered): No orders of the defined types were placed in this encounter.  New Prescriptions   No medications on file   Medications administered today: Mr. Birr had no medications administered during this visit. Lab-work, procedure(s), and/or referral(s): Orders Placed This Encounter  Procedures  . ToxASSURE Select 13 (MW), Urine   Imaging and/or referral(s): None  Interventional therapies: Planned, scheduled, and/or pending:   Not at this time.   Considering:   Bilateral lumbar facet RFA.   Palliative PRN treatment(s):   ***   Provider-requested follow-up: No Follow-up on file.  Future Appointments Date Time Provider [place]  01/16/2017 9:40 AM [name], MD [facility]   Primary Care Physician: [name], PA-C Location: [facility] Outpatient Pain Management Facility Note by: Vevelyn Francois NP Date: 12/22/2016; Time: 1:44 PM  Pain Score Disclaimer: We use the NRS-11 scale. This is a  self-reported, subjective measurement of pain severity with only modest accuracy. It is used primarily to identify changes within a particular patient. It must be understood that outpatient pain scales are significantly less accurate that those used for research, where they can be applied under ideal controlled circumstances with minimal exposure to variables. In reality, the score is likely to be a combination of pain intensity and pain affect, where pain affect describes the degree of emotional arousal or changes in action readiness caused by the sensory experience of pain. Factors such as social and work situation, setting, emotional state, anxiety levels, expectation, and prior pain experience may influence pain perception and show large inter-individual differences that may also be affected by time variables.  Patient instructions provided during this appointment: There are no Patient Instructions on file for this visit.

## 2016-12-22 NOTE — Patient Instructions (Signed)

## 2016-12-22 NOTE — Progress Notes (Signed)
Nursing Pain Medication Assessment:  Safety precautions to be maintained throughout the outpatient stay will include: orient to surroundings, keep bed in low position, maintain call bell within reach at all times, provide assistance with transfer out of bed and ambulation.  Medication Inspection Compliance: Lance Cook did not comply with our request to bring his pills to be counted. He was reminded that bringing the medication bottles, even when empty, is a requirement.  Medication: None brought in. Pill/Patch Count: None available to be counted. Bottle Appearance: No container available. Did not bring bottle(s) to appointment. Filled Date: N/A Last Medication intake:  Today 

## 2016-12-22 NOTE — Progress Notes (Signed)
Difficulty printing Rx "to last until date" did not include date.  Date was added 2018 by Dionisio David NP.

## 2016-12-22 NOTE — Progress Notes (Signed)
Patient's Name: Lance Cook  MRN: [id number]  Referring Provider: [name], PA-C  DOB: [birth year]  PCP: Lance Dials, PA-C  DOS: 12/22/2016  Note by: Lance Francois NP  Service setting: Ambulatory outpatient  Specialty: Interventional Pain Management  Location: [facility] (AMB) Pain Management Facility    Patient type: Established    Primary Reason(s) for Visit: Encounter for prescription drug management (Level of risk: moderate) CC: Back Pain (lower back, L4,L5)  HPI  Lance Cook is a 56 y.o. year old, male patient, who comes today for a medication management evaluation. 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; Ascending aortic dissection ([place]); Chronic diastolic congestive heart failure (HCC); 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; Enteritis due to Clostridium difficile; Hypomagnesemia; Physical deconditioning; Long term current use of opiate analgesic; Long term prescription opiate use; Opiate use (22.5 MME/day); Opiate dependence ([place]); Encounter for therapeutic drug level monitoring; Chronic low back pain (midline); Lumbar facet syndrome; Personal history of transient ischemic attack (TIA), and cerebral infarction without residual deficits; ED (erectile dysfunction) of organic origin; Eunuchoidism; Adult BMI 30+; Obstructive apnea; Testicular hypofunction; Lumbar canal stenosis; Malignant neoplasm of testis  ([place]); Chronic lower extremity pain (Left); Personal history of malignant neoplasm of testis; Musculoskeletal pain; History of colonic polyps; Chronic diarrhea; Benign neoplasm of transverse colon; Scrotal swelling; Chronic pain syndrome; Acute on chronic diastolic CHF (congestive heart failure) (HCC); S/P MVR (mitral valve replacement); H/O TIA (transient ischemic attack) and stroke; Heart valve replaced by other means; HTN (hypertension); Low back pain; Lymphadenopathy; Male hypogonadism; Obesity (BMI 30-39.9); Obstructive sleep apnea; Other testicular hypofunction; Presence of other heart-valve replacement; S/P AVR (aortic valve replacement); Spinal stenosis of lumbar region without neurogenic claudication; Spondylosis; Type 2 diabetes mellitus with other specified complication ([place]); Type II or unspecified type diabetes mellitus with other specified manifestations, not stated as uncontrolled; and Leg swelling on his problem list. His primarily concern today is the Back Pain (lower back, L4,L5)  Pain Assessment: Self-Reported Pain Score: 6 /10 Clinically the patient looks like a 2/10 Reported level is compatible with observation. Information on the proper use of the pain scale provided to the patient today Pain Type: Chronic pain Pain Location: Back Pain Orientation: Lower Pain Descriptors / Indicators: Sharp, Pressure, Jabbing, Constant Pain Frequency: Constant  Lance Cook was last scheduled for an appointment on Visit date not found for medication management. During today's appointment we reviewed Lance Cook's chronic pain status, as well as his outpatient medication regimen. He has chronic low back pain "L4-5". He denies any radicular symptoms. He is SP RFA a few years ago.   The patient  reports that he does not use drugs. His body mass index is 40.81 kg/m.  Further details on both, my assessment(s), as well as the proposed treatment plan, please see below.  Controlled Substance  Pharmacotherapy Assessment REMS (Risk Evaluation and Mitigation Strategy)  Analgesic:Oxycodone IR 60m q8hrs (15 mg/day of oxycodone) MME/day:22.5 mg/day Lance Billow RN  12/22/2016  1:28 PM  Sign at close encounter Nursing Pain Medication Assessment:  Safety precautions to be  maintained throughout the outpatient stay will include: orient to surroundings, keep bed in low position, maintain call bell within reach at all times, provide assistance with transfer out of bed and ambulation.  Medication Inspection Compliance: Lance Cook did not comply with our request to bring his pills to be counted. He was reminded that bringing the medication bottles, even when empty, is a requirement.  Medication: None brought in. Pill/Patch Count: None available to be counted. Bottle Appearance: No container available. Did not bring bottle(s) to appointment. Filled Date: N/A Last Medication intake:  Today   Pharmacokinetics: Liberation and absorption (onset of action): WNL Distribution (time to peak effect): WNL Metabolism and excretion (duration of action): WNL         Pharmacodynamics: Desired effects: Analgesia: Mr. Brainerd reports >50% benefit. Functional ability: Patient reports that medication allows him to accomplish basic ADLs Clinically meaningful improvement in function (CMIF): Sustained CMIF goals met Perceived effectiveness: Described as relatively effective, allowing for increase in activities of daily living (ADL) Undesirable effects: Side-effects or Adverse reactions: None reported Monitoring: [place] PMP: Online review of the past 71-monthperiod conducted. Compliant with practice rules and regulations List of all UDS test(s) done:  Lab Results  Component Value Date   TOXASSSELUR FINAL 01/07/2016   TOXASSSELUR FINAL 10/09/2015   TOXASSSELUR FINAL 07/11/2015   Last UDS on record: ToxAssure Select 13  Date Value Ref Range Status  01/07/2016 FINAL  Final    Comment:     ==================================================================== TOXASSURE SELECT 13 (MW) ==================================================================== Test                             Result       Flag       Units Drug Present and Declared for Prescription Verification   Oxazepam                       782          EXPECTED   ng/mg creat   Temazepam                      10809        EXPECTED   ng/mg creat    Oxazepam and temazepam are expected metabolites of diazepam.    Oxazepam is also an expected metabolite of other benzodiazepine    drugs, including chlordiazepoxide, prazepam, clorazepate,    halazepam, and temazepam.  Oxazepam and temazepam are available    as scheduled prescription medications.   Oxycodone                      1568         EXPECTED   ng/mg creat   Oxymorphone                    305          EXPECTED   ng/mg creat   Noroxycodone                   5650         EXPECTED   ng/mg creat    Sources of oxycodone include scheduled prescription medications.    Oxymorphone and noroxycodone are expected metabolites of    oxycodone. Oxymorphone is also available as a scheduled    prescription medication. ==================================================================== Test  Result    Flag   Units      Ref Range   Creatinine              22               mg/dL      >=20 ==================================================================== Declared Medications:  The flagging and interpretation on this report are based on the  following declared medications.  Unexpected results may arise from  inaccuracies in the declared medications.  **Note: The testing scope of this panel includes these medications:  Oxycodone (Oxy-IR)  Temazepam (Restoril)  **Note: The testing scope of this panel does not include following  reported medications:  Allopurinol (Zyloprim)  Atenolol (Tenormin)  Calcium (Calcium/Magnesium/Zinc)  Clindamycin (Cleocin)   Furosemide (Lasix)  Magnesium (Calcium/Magnesium/Zinc)  Magnesium (Mag-Ox)  Methocarbamol (Robaxin)  Potassium (K-Dur)  Topical  Topical (Silvadene)  Vitamin B  Warfarin (Coumadin)  Zinc (Calcium/Magnesium/Zinc) ==================================================================== For clinical consultation, please call [phone]. ====================================================================    UDS interpretation: Compliant          Medication Assessment Form: Reviewed. Patient indicates being compliant with therapy Treatment compliance: Compliant Risk Assessment Profile: Aberrant behavior: See prior evaluations. None observed or detected today Comorbid factors increasing risk of overdose: See prior notes. No additional risks detected today Risk of substance use disorder (SUD): Low Opioid Risk Tool (ORT) Total Score: 0  Interpretation Table:  Score <3 = Low Risk for SUD  Score between 4-7 = Moderate Risk for SUD  Score >8 = High Risk for Opioid Abuse   Risk Mitigation Strategies:  Patient Counseling: Covered Patient-Prescriber Agreement (PPA): Present and active  Notification to other healthcare providers: Done  Pharmacologic Plan: No change in therapy, at this time  Laboratory Chemistry  Inflammation Markers Lab Results  Component Value Date   CRP 0.9 10/09/2015   ESRSEDRATE 9 01/14/2016   (CRP: Acute Phase) (ESR: Chronic Phase) Renal Function Markers Lab Results  Component Value Date   BUN 18 12/03/2016   CREATININE 0.75 (L) 12/03/2016   GFRAA 119 12/03/2016   GFRNONAA 103 12/03/2016   Hepatic Function Markers Lab Results  Component Value Date   AST 21 02/05/2016   ALT 19 02/05/2016   ALBUMIN 2.6 (L) 02/05/2016   ALKPHOS 55 02/05/2016   Electrolytes Lab Results  Component Value Date   NA 139 12/03/2016   K 4.1 12/03/2016   CL 95 (L) 12/03/2016   CALCIUM 8.5 (L) 12/03/2016   MG 1.8 09/03/2016   Neuropathy Markers Lab Results  Component  Value Date   VITAMINB12 371 01/07/2012   Bone Pathology Markers Lab Results  Component Value Date   ALKPHOS 55 02/05/2016   CALCIUM 8.5 (L) 12/03/2016   TESTOFREE 49.5 03/22/2012   TESTOSTERONE 206.14 (L) 08/18/2012   Coagulation Parameters Lab Results  Component Value Date   INR 1.8 12/03/2016   LABPROT 33.3 (H) 09/03/2016   APTT 41 (H) 01/24/2015   PLT 127 (L) 09/03/2016   Cardiovascular Markers Lab Results  Component Value Date   BNP 260.5 (H) 08/27/2016   HGB 10.9 (L) 09/05/2016   HCT 32.0 (L) 09/05/2016   Note: Lab results reviewed.  Recent Diagnostic Imaging Review  Ct Soft Tissue Neck W Contrast  Result Date: 10/06/2016 CLINICAL DATA:  Left vocal cord paralysis. Prior left parotid resection. EXAM: CT NECK WITH CONTRAST TECHNIQUE: Multidetector CT imaging of the neck was performed using the standard protocol following the bolus administration of intravenous contrast. CONTRAST:  72m ISOVUE-300 IOPAMIDOL (ISOVUE-300) INJECTION  61% COMPARISON:  08/18/2012 neck CT.  11/21/2014 chest CTA. FINDINGS: Pharynx and larynx: No pharyngeal mass or swelling. There is motion artifact through the larynx which limits assessment. There is suggestion of asymmetric medial ization of the left vocal cord with some thickening of the left AE folds which would be compatible with the provided history of left vocal cord paralysis. No gross laryngeal mass is identified. Salivary glands: Submandibular glands and right parotid gland are unremarkable. Sequelae of interval left parotidectomy are identified with resection of the lipomatous mass extending inferiorly superficial to the sternocleidomastoid muscle on the prior study. Thyroid: Mild diffuse enlargement, nodularity, and heterogeneity of the thyroid without dominant mass identified. Lymph nodes: There is an enlarged 11 mm short axis left supraclavicular lymph node which has increased in size from 2013 (series 3, image 76, previously 6 mm). Additional  subcentimeter left supraclavicular and left thoracic inlet lymph nodes have also mildly enlarged. Right paratracheal lymph nodes near the thoracic inlet have mildly enlarged and measure up to 11 mm in short axis (series 3, image 81). Vascular: Major vascular structures of the neck are patent. Limited intracranial: Unremarkable. Visualized orbits: Not imaged. Mastoids and visualized paranasal sinuses: Unchanged mucous retention cyst or polyp inferiorly in the right maxillary sinus. Minimal left maxillary sinus mucosal thickening. Clear mastoid air cells. Skeleton: Large anterior vertebral osteophytes C4 to T1. Bilateral facet ankylosis at C2-3 and C7-T1. Partial ankylosis across the C2-3 disc space as well. Upper chest: Numerous subcentimeter short axis peritracheal, prevascular, and precarinal lymph nodes are similar to the 2016 chest CTA. Chronic short-segment dissection in the proximal ascending aorta was incompletely imaged and is also partially obscured by motion artifact. Small pleural effusions are partially visualized. Unchanged small left upper lobe lung nodules from 2016, the largest of which measures 3 mm and is calcified. Other: None. IMPRESSION: 1. Mild left supraclavicular and bilateral thoracic inlet lymphadenopathy, increased from 2013 and of indeterminate etiology. 2. Numerous subcentimeter mediastinal lymph nodes, unchanged from 2016. 3. No primary neck mass. 4. Limited assessment of the larynx due to motion. 5. Mildly enlarged and diffusely nodular thyroid likely reflecting goiter. Electronically Signed   By: Logan Bores M.D.   On: 10/06/2016 15:21   Note: Imaging results reviewed.          Meds  The patient has a current medication list which includes the following prescription(s): acetaminophen, allopurinol, atenolol, bacitracin, digoxin, furosemide, magnesium oxide, methocarbamol, oxycodone, potassium chloride, temazepam, warfarin, oxycodone, and oxycodone.  Current Outpatient  Prescriptions on File Prior to Visit  Medication Sig  . acetaminophen (TYLENOL) 500 MG tablet Take 500 mg by mouth every 8 (eight) hours as needed for moderate pain or headache.   . allopurinol (ZYLOPRIM) 300 MG tablet Take 300 mg by mouth daily.  Marland Kitchen atenolol (TENORMIN) 25 MG tablet Take 1 tablet (25 mg total) by mouth 2 (two) times daily.  . bacitracin 500 UNIT/GM ointment Apply 1 application topically as needed for wound care.   . digoxin (LANOXIN) 0.25 MG tablet Take 1 tablet (0.25 mg total) by mouth daily.  . furosemide (LASIX) 80 MG tablet Take 80 mg by mouth 2 (two) times daily.  . magnesium oxide (MAG-OX) 400 (241.3 MG) MG tablet Take 1 tablet (400 mg total) by mouth 2 (two) times daily.  . potassium chloride (K-DUR) 10 MEQ tablet Take 2 tablets (20 mEq total) by mouth 2 (two) times daily.  . temazepam (RESTORIL) 15 MG capsule Take 15 mg by mouth at bedtime as needed  for sleep.  Marland Kitchen warfarin (COUMADIN) 5 MG tablet TAKE 1 TO 1 AND 1/2 TABLETS DAILY AS DIRECTED BY COUMADIN CLINIC (Patient taking differently: Take 5 mg by mouth on Monday, Wednesday and Friday. Take 7.5 mg by mouth on all other days)   No current facility-administered medications on file prior to visit.    ROS  Constitutional: Denies any fever or chills Gastrointestinal: No reported hemesis, hematochezia, vomiting, or acute GI distress Musculoskeletal: Denies any acute onset joint swelling, redness, loss of ROM, or weakness Neurological: No reported episodes of acute onset apraxia, aphasia, dysarthria, agnosia, amnesia, paralysis, loss of coordination, or loss of consciousness  Allergies  Mr. Vanrossum is allergic to penicillins; adhesive [tape]; and bactrim [sulfamethoxazole-trimethoprim].  [place]  Drug: Mr. Rodda  reports that he does not use drugs. Alcohol:  reports that he drinks alcohol. Tobacco:  reports that he has been smoking Cigars.  He has never used smokeless tobacco. Medical:  has a past medical history of Anemia;  Anxiety; ARDS (adult respiratory distress syndrome) ([place]) (01/27/2015); Arthritis; Ascending aortic dissection ([place]) (07/14/2008); Asymptomatic chronic venous hypertension (01/15/2010); Atrial fibrillation ([place]); Bell's palsy; CAD (coronary artery disease); Cellulitis (04/02/2014); Cerebral artery occlusion with cerebral infarction ([place]) (10/08/2011); Chronic diastolic heart failure ([place]) (03/01/2015); Chronic LBP (10/08/2011); CVA (cerebral vascular accident) ([place]) (10/08/2011); DIABETES MELLITUS, TYPE II (11/01/2009); Diverticulosis; 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); Encephalopathy acute (02/05/2015); GERD (gastroesophageal reflux disease); Gout; H/O mechanical aortic valve replacement (11/01/2009); Hiatal hernia; History of colon polyps (12/23/2011); Hyperlipidemia; Hypertension; Hypogonadism male (04/08/2012); Impaired glucose tolerance (10/08/2011); Morbid obesity ([place]) (04/02/2014); Myocardial infarction 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 Family: family history includes Cancer in his maternal aunt and maternal uncle; Diabetes in his father; Heart disease in his mother; Hypertension in his other; Other in his mother; Stroke in his other; Throat cancer in his mother.  Past Surgical History:  Procedure Laterality Date  . aortic truck    . BENTALL PROCEDURE  1988   25 mm St Jude mechanical valve conduit - Dr Blase Mess at [facility] in [place], [place]  . CHOLECYSTECTOMY  2011  . COLONOSCOPY WITH PROPOFOL N/A 06/17/2016   Procedure: COLONOSCOPY WITH PROPOFOL;  Surgeon: Jerene Bears, MD;  Location: [facility] ENDOSCOPY;  Service:  Gastroenterology;  Laterality: N/A;  . CORONARY ARTERY BYPASS GRAFT    . LASER ABLATION Left 03/06/2010   leg  . MITRAL VALVE REPLACEMENT Right 01/24/2015   Procedure: Re-Operation, MINIMALLY INVASIVE MITRAL VALVE (MV) REPLACEMENT.;  Surgeon: Rexene Alberts, MD;  Location: [place];  Service: Open Heart Surgery;  Laterality: Right;  . ORCHIECTOMY  age 87   testicular cancer  . OTOPLASTY     bilateral, age 5  . TEE WITHOUT CARDIOVERSION N/A 10/11/2014   Procedure: TRANSESOPHAGEAL ECHOCARDIOGRAM (TEE);  Surgeon: Pixie Casino, MD;  Location[facility]COPY;  Service: Cardiovascular;  Laterality: N/A;  . TEE WITHOUT CARDIOVERSION N/A 01/24/2015   Procedure: TRANSESOPHAGEAL ECHOCARDIOGRAM (TEE);  Surgeon: Rexene Alberts, MD;  Location: [place];  Service: Open Heart Surgery;  Laterality: N/A;  . TONSILLECTOMY  1967  . TOOTH EXTRACTION  2016   Constitutional Exam  General appearance: Well nourished, well developed, and well hydrated. In no  apparent acute distress Vitals:   12/22/16 1320  BP: 122/62  Pulse: 70  Resp: 16  Temp: 98 F (36.7 C)  TempSrc: Oral  SpO2: 100%  Weight: 216 lb (98 kg)  Height: 5' 1"  (1.549 m)   BMI Assessment: Estimated body mass index is 40.81 kg/m as calculated from the following:   Height as of this encounter: 5' 1"  (1.549 m).   Weight as of this encounter: 216 lb (98 kg).  BMI interpretation table: BMI level Category Range association with higher incidence of chronic pain  <18 kg/m2 Underweight   18.5-24.9 kg/m2 Ideal body weight   25-29.9 kg/m2 Overweight Increased incidence by 20%  30-34.9 kg/m2 Obese (Class I) Increased incidence by 68%  35-39.9 kg/m2 Severe obesity (Class II) Increased incidence by 136%  >40 kg/m2 Extreme obesity (Class III) Increased incidence by 254%   BMI Readings from Last 4 Encounters:  12/22/16 40.81 kg/m  12/05/16 39.78 kg/m  11/28/16 42.70 kg/m  10/17/16 38.66 kg/m   Wt Readings from Last 4 Encounters:  12/22/16 216  lb (98 kg)  12/05/16 214 lb (97.1 kg)  11/28/16 226 lb (102.5 kg)  10/17/16 208 lb (94.3 kg)  Psych/Mental status: Alert, oriented x 3 (person, place, & time)       Eyes: PERLA Respiratory: No evidence of acute respiratory distress  Cervical Spine Exam  Inspection: No masses, redness, or swelling Alignment: Symmetrical Functional ROM: Unrestricted ROM      Stability: No instability detected Muscle strength & Tone: Functionally intact Sensory: Unimpaired Palpation: No palpable anomalies              Upper Extremity (UE) Exam    Side: Right upper extremity  Side: Left upper extremity  Inspection: No masses, redness, swelling, or asymmetry. No contractures  Inspection: No masses, redness, swelling, or asymmetry. No contractures  Functional ROM: Unrestricted ROM          Functional ROM: Unrestricted ROM          Muscle strength & Tone: Functionally intact  Muscle strength & Tone: Functionally intact  Sensory: Unimpaired  Sensory: Unimpaired  Palpation: No palpable anomalies              Palpation: No palpable anomalies              Specialized Test(s): Deferred         Specialized Test(s): Deferred          Thoracic Spine Exam  Inspection: No masses, redness, or swelling Alignment: Symmetrical Functional ROM: Unrestricted ROM Stability: No instability detected Sensory: Unimpaired Muscle strength & Tone: No palpable anomalies  Lumbar Spine Exam  Inspection: No masses, redness, or swelling Alignment: Symmetrical Functional ROM: Unrestricted ROM      Stability: No instability detected Muscle strength & Tone: Functionally intact Sensory: Unimpaired Palpation: No palpable anomalies       Provocative Tests: Lumbar Hyperextension and rotation test: Unable to perform       Able to perform heel and toe walking and flexion   Patrick's Maneuver: evaluation deferred today                    Gait & Posture Assessment  Ambulation: Unassisted Gait: Relatively normal for age and body  habitus Posture: WNL   Lower Extremity Exam    Side: Right lower extremity  Side: Left lower extremity  Inspection: No masses, redness, swelling, or asymmetry. No contractures  Inspection: No masses, redness, swelling, or asymmetry. No contractures  Functional ROM: Unrestricted ROM          Functional ROM: Unrestricted ROM          Muscle strength & Tone: Functionally intact  Muscle strength & Tone: Functionally intact  Sensory: Unimpaired  Sensory: Unimpaired  Palpation: No palpable anomalies  Palpation: No palpable anomalies   Assessment  Primary Diagnosis & Pertinent Problem List: The primary encounter diagnosis was Chronic pain syndrome. Diagnoses of Long term current use of opiate analgesic, Musculoskeletal pain, Spinal stenosis of lumbar region, unspecified whether neurogenic claudication present, and Lumbar facet syndrome were also pertinent to this visit.  Status Diagnosis  Controlled Controlled Controlled 1. Chronic pain syndrome   2. Long term current use of opiate analgesic   3. Musculoskeletal pain   4. Spinal stenosis of lumbar region, unspecified whether neurogenic claudication present   5. Lumbar facet syndrome      Plan of Care  Pharmacotherapy (Medications Ordered): Meds ordered this encounter  Medications  . methocarbamol (ROBAXIN) 500 MG tablet    Sig: Take 1 tablet (500 mg total) by mouth 3 (three) times daily.    Dispense:  270 tablet    Refill:  0    Do not place this medication, or any other prescription from our practice, on "Automatic Refill". Patient may have prescription filled one day early if pharmacy is closed on scheduled refill date.    Order Specific Question:   Supervising Provider    Answer:   Milinda Pointer [phone]  . oxyCODONE (ROXICODONE) 5 MG immediate release tablet    Sig: Take 1 tablet (5 mg total) by mouth every 8 (eight) hours as needed for severe pain.    Dispense:  30 tablet    Refill:  0    Do not place this medication, or  any other prescription from our practice, on "Automatic Refill". Patient may have prescription filled one day early if pharmacy is closed on schedule.Fill one day early if pharmacy is closed on scheduled refill date. Do not fill until: 02/24/17 To last until: 07/25/    Order Specific Question:   Supervising Provider    Answer:   Milinda Pointer [phone]  . oxyCODONE (OXY IR/ROXICODONE) 5 MG immediate release tablet    Sig: Take 1 tablet (5 mg total) by mouth every 8 (eight) hours as needed for severe pain.    Dispense:  90 tablet    Refill:  0    Do not place this medication, or any other prescription from our practice, on "Automatic Refill". Patient may have prescription filled one day early if pharmacy is closed on schedule.Fill one day early if pharmacy is closed on scheduled refill date. Do not fill until: 01/24/17 To last until: 06/25/    Order Specific Question:   Supervising Provider    Answer:   Milinda Pointer [phone]  . oxyCODONE (ROXICODONE) 5 MG immediate release tablet    Sig: Take 1 tablet (5 mg total) by mouth every 8 (eight) hours as needed for severe pain.    Dispense:  30 tablet    Refill:  0    Do not place this medication, or any other prescription from our practice, on "Automatic Refill". Patient may have prescription filled one day early if pharmacy is closed on schedule.Fill one day early if pharmacy is closed on scheduled refill date. Do not fill until: 03/26/17 To last until: 08/25/    Order Specific Question:   Supervising Provider    Answer:   Milinda Pointer [phone]  New Prescriptions   OXYCODONE (ROXICODONE) 5 MG IMMEDIATE RELEASE TABLET    Take 1 tablet (5 mg total) by mouth every 8 (eight) hours as needed for severe pain.   OXYCODONE (ROXICODONE) 5 MG IMMEDIATE RELEASE TABLET    Take 1 tablet (5 mg total) by mouth every 8 (eight) hours as needed for severe pain.   Medications administered today: Mr. Lewan had no medications administered during this  visit. Lab-work, procedure(s), and/or referral(s): Orders Placed This Encounter  Procedures  . ToxASSURE Select 13 (MW), Urine   Imaging and/or referral(s): None  Interventional therapies: Planned, scheduled, and/or pending:   Not at this time.   Considering:   Bilateral lumbar facet RFA.    Palliative PRN treatment(s):   Not at this time   Provider-requested follow-up: Return in 3 months (on 03/23/2017) for Medication Mgmt.  Future Appointments Date Time Provider [place]  01/16/2017 9:40 AM [name], MD [facility]   Primary Care Physician: Lance Dials, PA-C Location: [facility] Note by: Lance Francois NP Date: 12/22/2016; Time: 2:29 PM  Pain Score Disclaimer: We use the NRS-11 scale. This is a self-reported, subjective measurement of pain severity with only modest accuracy. It is used primarily to identify changes within a particular patient. It must be understood that outpatient pain scales are significantly less accurate that those used for research, where they can be applied under ideal controlled circumstances with minimal exposure to variables. In reality, the score is likely to be a combination of pain intensity and pain affect, where pain affect describes the degree of emotional arousal or changes in action readiness caused by the sensory experience of pain. Factors such as social and work situation, setting, emotional state, anxiety levels, expectation, and prior pain experience may influence pain perception and show large inter-individual differences that may also be affected by time variables.  Patient instructions provided during this appointment: Patient Instructions   Pain Score  Introduction: The pain score used by this practice is the Verbal Numerical Rating Scale (VNRS-11). This is an 11-point scale. It is for adults and children 10 years or older. There are significant differences in how the pain score is  reported, used, and applied. Forget everything you learned in the past and learn this scoring system.  General Information: The scale should reflect your current level of pain. Unless you are specifically asked for the level of your worst pain, or your average pain. If you are asked for one of these two, then it should be understood that it is over the past 24 hours.  Basic Activities of Daily Living (ADL): Personal hygiene, dressing, eating, transferring, and using restroom.  Instructions: Most patients tend to report their level of pain as a combination of two factors, their physical pain and their psychosocial pain. This last one is also known as "suffering" and it is reflection of how physical pain affects you socially and psychologically. From now on, report them separately. From this point on, when asked to report your pain level, report only your physical pain. Use the following table for reference.  Pain Clinic Pain Levels (0-5/10)  Pain Level Score Description  No Pain 0   Mild pain 1 Nagging, annoying, but does not interfere with basic activities of daily living (ADL). Patients are able to eat, bathe, get dressed, toileting (being able to get on and off the toilet and perform personal hygiene functions), transfer (move in and out of bed or a chair without assistance),  and maintain continence (able to control bladder and bowel functions). Blood pressure and heart rate are unaffected. A normal heart rate for a healthy adult ranges from 60 to 100 bpm (beats per minute).   Mild to moderate pain 2 Noticeable and distracting. Impossible to hide from other people. More frequent flare-ups. Still possible to adapt and function close to normal. It can be very annoying and may have occasional stronger flare-ups. With discipline, patients may get used to it and adapt.   Moderate pain 3 Interferes significantly with activities of daily living (ADL). It becomes difficult to feed, bathe, get dressed, get  on and off the toilet or to perform personal hygiene functions. Difficult to get in and out of bed or a chair without assistance. Very distracting. With effort, it can be ignored when deeply involved in activities.   Moderately severe pain 4 Impossible to ignore for more than a few minutes. With effort, patients may still be able to manage work or participate in some social activities. Very difficult to concentrate. Signs of autonomic nervous system discharge are evident: dilated pupils (mydriasis); mild sweating (diaphoresis); sleep interference. Heart rate becomes elevated (>115 bpm). Diastolic blood pressure (lower number) rises above 100 mmHg. Patients find relief in laying down and not moving.   Severe pain 5 Intense and extremely unpleasant. Associated with frowning face and frequent crying. Pain overwhelms the senses.  Ability to do any activity or maintain social relationships becomes significantly limited. Conversation becomes difficult. Pacing back and forth is common, as getting into a comfortable position is nearly impossible. Pain wakes you up from deep sleep. Physical signs will be obvious: pupillary dilation; increased sweating; goosebumps; brisk reflexes; cold, clammy hands and feet; nausea, vomiting or dry heaves; loss of appetite; significant sleep disturbance with inability to fall asleep or to remain asleep. When persistent, significant weight loss is observed due to the complete loss of appetite and sleep deprivation.  Blood pressure and heart rate becomes significantly elevated. Caution: If elevated blood pressure triggers a pounding headache associated with blurred vision, then the patient should immediately seek attention at an urgent or emergency care unit, as these may be signs of an impending stroke.    Emergency Department Pain Levels (6-10/10)  Emergency Room Pain 6 Severely limiting. Requires emergency care and should not be seen or managed at an outpatient pain management  facility. Communication becomes difficult and requires great effort. Assistance to reach the emergency department may be required. Facial flushing and profuse sweating along with potentially dangerous increases in heart rate and blood pressure will be evident.   Distressing pain 7 Self-care is very difficult. Assistance is required to transport, or use restroom. Assistance to reach the emergency department will be required. Tasks requiring coordination, such as bathing and getting dressed become very difficult.   Disabling pain 8 Self-care is no longer possible. At this level, pain is disabling. The individual is unable to do even the most "basic" activities such as walking, eating, bathing, dressing, transferring to a bed, or toileting. Fine motor skills are lost. It is difficult to think clearly.   Incapacitating pain 9 Pain becomes incapacitating. Thought processing is no longer possible. Difficult to remember your own name. Control of movement and coordination are lost.   The worst pain imaginable 10 At this level, most patients pass out from pain. When this level is reached, collapse of the autonomic nervous system occurs, leading to a sudden drop in blood pressure and heart rate. This in turn results  in a temporary and dramatic drop in blood flow to the brain, leading to a loss of consciousness. Fainting is one of the body's self defense mechanisms. Passing out puts the brain in a calmed state and causes it to shut down for a while, in order to begin the healing process.    Summary: 1. Refer to this scale when providing [place] with your pain level. 2. Be accurate and careful when reporting your pain level. This will help with your care. 3. Over-reporting your pain level will lead to loss of credibility. 4. Even a level of 1/10 means that there is pain and will be treated at our facility. 5. High, inaccurate reporting will be documented as "Symptom Exaggeration", leading to loss of credibility and  suspicions of possible secondary gains such as obtaining more narcotics, or wanting to appear disabled, for fraudulent reasons. 6. Only pain levels of 5 or below will be seen at our facility. 7. Pain levels of 6 and above will be sent to the Emergency Department and the appointment cancelled. _____________________________________________________________________________________________

## 2016-12-25 ENCOUNTER — Telehealth: Payer: Self-pay | Admitting: Pain Medicine

## 2016-12-25 NOTE — Telephone Encounter (Signed)
[facility] calling about Lance Cook EOC# [id number] Methocarbamal. They have questions need answered before 3:22 am 12-27-16 Please call [phone]

## 2016-12-26 NOTE — Telephone Encounter (Signed)
I have spoken with [employer] in an effort to get this medication approved.

## 2016-12-29 LAB — TOXASSURE SELECT 13 (MW), URINE

## 2016-12-30 ENCOUNTER — Encounter: Payer: Self-pay | Admitting: Cardiology

## 2017-01-09 ENCOUNTER — Telehealth: Payer: Self-pay | Admitting: Cardiology

## 2017-01-09 MED ORDER — WARFARIN SODIUM 5 MG PO TABS: ORAL_TABLET | ORAL | 0 refills | Status: DC

## 2017-01-09 NOTE — Telephone Encounter (Signed)
New Message   *STAT* If patient is at the pharmacy, call can be transferred to refill team.   1. Which medications need to be refilled? (please list name of each medication and dose if known)  warfarin (coumadin) 5 mg tablet       Sig: TAKE 1 TO 1 AND 1/2 TABLETS DAILY AS DIRECTED BY COUMADIN CLINIC  Patient taking differently: Take 5 mg by mouth on Monday, Wednesday and Friday. Take 7.5 mg by mouth on all other days     2. Which pharmacy/location (including street and city if local pharmacy) is medication to be sent to?  [facility] delivery  3. Do they need a 30 day or 90 day supply?  90 day supply

## 2017-01-09 NOTE — Telephone Encounter (Signed)
Fwd to coum clinic to review.

## 2017-01-09 NOTE — Telephone Encounter (Signed)
Patient past due for INR check, scheduled for May 18 when seeing Dr. [name]

## 2017-01-12 MED FILL — DIGOXIN 250 MCG TABLET: 250 | 30 days supply | Qty: 30 | Fill #1

## 2017-01-14 ENCOUNTER — Ambulatory Visit: Payer: Self-pay | Admitting: Cardiology

## 2017-01-15 NOTE — Progress Notes (Signed)
Follow-up     Cardiology Office Note   Date:  01/18/2017   ID:  Lance Cook, DOB 1961-03-31, MRN [id number]  PCP:  [name], PA-C  Cardiologist:   [name], MD    Chief Complaint  Patient presents with  . Edema      History of Present Illness: Lance Cook is a 56 y.o. male who presents for follow up of anasarca.   He has a past medical history of AVR (s/p Bentall procedure in 1988), severe mitral regurgitation (s/p St Jude mechanical valve placed in 12/2014), CAD (s/p SVG-RCA, known to be occluded by cath in 12/2014), chronic atrial fibrillation, chronic diastolic CHF (EF 41-74% by echo in 12/2015), prior CVA, chronic venous insufficiency, OSA (on CPAP)and recurrent C. difficile infection.  He presented in Jan with dyspnea and anasarca.  He was hospitalized and had 21 liters removed with diuresis.  He weight 222 lbs at discharge.  When I last saw him in March he had started to accumulate fluid again.  I increased his Lasix.  He came back one week later.   His weight was done and his labs stable.  He returns for follow up.    Since being here last time he has again accumulated fluid.  He is clearly not complaint with diet.  He notes that his weight has gone up but he has not contacted [place].  He sometimes doesn't take his Lasix if he is cramping.  He has some sluggishness but not dyspnea, PND or orthopnea.   He is having a sensation of cramping in his legs and face at night.  He does occasionally.     Past Medical History:  Diagnosis Date  . Anemia   . Anxiety   . ARDS (adult respiratory distress syndrome) ([place]) 01/27/2015  . Arthritis   . Ascending aortic dissection ([place]) 07/14/2008   Localized dissection of ascending aorta noted on CTA in 2009 and stable on CTA in 2011  . Asymptomatic chronic venous hypertension 01/15/2010   Overview:  Overview:  Qualifier: Diagnosis of  By: [name] MD, [name]   Last Assessment & Plan:  I suggested he go to a vein clinic to see if he could  be qualified compression hose of some type, since he is not a surgical candidate according to the vascular surgeons.   . Atrial fibrillation ([place])    chronic persistent  . Bell's palsy   . CAD (coronary artery disease)    Old scar inferior wall myoview, 10/2009 EF 52%.  He did have previous SVG to RCA but no obstructive disease noted on his most recent cath.  SVG occluded.   . Cellulitis 04/02/2014  . Cerebral artery occlusion with cerebral infarction ([place]) 10/08/2011   Overview:  Overview:  And hx of TIA prior to CABG, all thought due to systemic emboli prior to coumadin   . Chronic diastolic heart failure ([place]) 03/01/2015  . Chronic LBP 10/08/2011  . CVA (cerebral vascular accident) ([place]) 10/08/2011   And hx of TIA prior to CABG, all thought due to systemic emboli prior to coumadin   . DIABETES MELLITUS, TYPE II 11/01/2009   Qualifier: Diagnosis of  By: [name] MD, [name]    . Diverticulosis   . 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 Encephalopathy acute 02/05/2015  . GERD (gastroesophageal reflux disease)    not needing medication  at thhis time- 01/22/15  . Gout   . H/O mechanical aortic valve replacement 11/01/2009   Qualifier: Diagnosis of  By: [name], [name]    . Hiatal hernia   . History of colon polyps 12/23/2011   Overview:  Overview:  Colonoscopy January 2010, 6 mm rectal tubulovillous adenoma. No high-grade dysplasia   . Hyperlipidemia   . Hypertension   . Hypogonadism male 04/08/2012  . Impaired glucose tolerance 10/08/2011  . Morbid obesity ([place]) 04/02/2014  . Myocardial infarction [facility]) age 37  . OSA (obstructive sleep apnea)    CPAP  . Peripheral vascular disease ([place])   . 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 10/23/9796   33 mm St Jude bileaflet mechanical valve placed via right mini thoracotomy approach  . S/P Bentall  aortic root replacement with St Jude mechanical valve conduit    1988 - Dr Blase Mess at [facility] in [place], [place]  . Severe mitral regurgitation 10/11/2014  . Testicular cancer [facility])    He was 56 y/o. He had surgical resection and rad tx's.   Marland Kitchen TIA (transient ischemic attack)    age 44  . Varicose veins     Past Surgical History:  Procedure Laterality Date  . aortic truck    . BENTALL PROCEDURE  1988   25 mm St Jude mechanical valve conduit - Dr Blase Mess at [facility] in [place], [place]  . CHOLECYSTECTOMY  2011  . COLONOSCOPY WITH PROPOFOL N/A 06/17/2016   Procedure: COLONOSCOPY WITH PROPOFOL;  Surgeon: Jerene Bears, MD;  Location: [facility] ENDOSCOPY;  Service: Gastroenterology;  Laterality: N/A;  . CORONARY ARTERY BYPASS GRAFT    . LASER ABLATION Left 03/06/2010   leg  . MITRAL VALVE REPLACEMENT Right 01/24/2015   Procedure: Re-Operation, MINIMALLY INVASIVE MITRAL VALVE (MV) REPLACEMENT.;  Surgeon: Rexene Alberts, MD;  Location: [place];  Service: Open Heart Surgery;  Laterality: Right;  . ORCHIECTOMY  age 21   testicular cancer  . OTOPLASTY     bilateral, age 89  . TEE WITHOUT CARDIOVERSION N/A 10/11/2014   Procedure: TRANSESOPHAGEAL ECHOCARDIOGRAM (TEE);  Surgeon: Pixie Casino, MD;  Location: [facility] ENDOSCOPY;  Service: Cardiovascular;  Laterality: N/A;  . TEE WITHOUT CARDIOVERSION N/A 01/24/2015   Procedure: TRANSESOPHAGEAL ECHOCARDIOGRAM (TEE);  Surgeon: Rexene Alberts, MD;  Location: [place];  Service: Open Heart Surgery;  Laterality: N/A;  . TONSILLECTOMY  1967  . TOOTH EXTRACTION  2016     Current Outpatient Prescriptions  Medication Sig Dispense Refill  . acetaminophen (TYLENOL) 500 MG tablet Take 500 mg by mouth every 8 (eight) hours as needed for moderate pain or headache.     . allopurinol (ZYLOPRIM) 300 MG tablet Take 300 mg by mouth daily.    Marland Kitchen atenolol (TENORMIN) 25 MG tablet Take 1 tablet (25 mg total)  by mouth 2 (two) times daily.    . bacitracin 500 UNIT/GM ointment Apply 1 application topically as needed for wound care.     . digoxin (LANOXIN) 0.25 MG tablet Take 1 tablet (0.25 mg total) by mouth daily. 90 tablet 3  . furosemide (LASIX) 80 MG tablet Take 80 mg by mouth 2 (two) times daily.    . magnesium oxide (MAG-OX) 400 (241.3 MG) MG tablet Take 1 tablet (400 mg total) by mouth 2 (two) times daily. 180 tablet 3  . [START ON 01/24/2017] methocarbamol (ROBAXIN) 500 MG tablet Take 1 tablet (  500 mg total) by mouth 3 (three) times daily. 270 tablet 0  . [START ON 01/24/2017] oxyCODONE (OXY IR/ROXICODONE) 5 MG immediate release tablet Take 1 tablet (5 mg total) by mouth every 8 (eight) hours as needed for severe pain. 90 tablet 0  . [START ON 02/23/2017] oxyCODONE (ROXICODONE) 5 MG immediate release tablet Take 1 tablet (5 mg total) by mouth every 8 (eight) hours as needed for severe pain. 30 tablet 0  . potassium chloride (K-DUR) 10 MEQ tablet Take 2 tablets (20 mEq total) by mouth 2 (two) times daily. 360 tablet 1  . temazepam (RESTORIL) 15 MG capsule Take 15 mg by mouth at bedtime as needed for sleep.    Marland Kitchen warfarin (COUMADIN) 5 MG tablet TAKE 1 TO 1 AND 1/2 TABLETS DAILY AS DIRECTED BY COUMADIN CLINIC 135 tablet 0   No current facility-administered medications for this visit.     Allergies:   Penicillins; Adhesive [tape]; and Bactrim [sulfamethoxazole-trimethoprim]   ROS:  Please see the history of present illness.   Otherwise, review of systems are positive for none.   All other systems are reviewed and negative.    PHYSICAL EXAM: VS:  BP 132/64   Pulse (!) 59   Ht 5' 1.5" (1.562 m)   Wt 212 lb 3.2 oz (96.3 kg)   BMI 39.45 kg/m  , BMI Body mass index is 39.45 kg/m.  GENERAL:  Well appearing NECK:  No jugular venous distention, waveform within normal limits, carotid upstroke brisk and symmetric, no bruits, no thyromegaly LUNGS:  Clear to auscultation bilaterally BACK:  No CVA  tenderness CHEST:  Well healed sternotomy scars HEART:  PMI not displaced or sustained ,S1 WNL, mechanical S2, no S3, no clicks, no rubs, apical systolic early peaking murmur, irregular ABD:  Flat, positive bowel sounds normal in frequency in pitch, no bruits, no rebound, no guarding, no midline pulsatile mass, no hepatomegaly, no splenomegaly EXT:  2 plus pulses throughout, moderate pitting and lymphedema, no cyanosis no clubbing SKIN:  No rashes no nodules, pretibial excoriations.   EKG:  EKG is not  ordered today. Atrial fib, LBBB, LAD, rate 59, no acute ST T wave changes  Recent Labs: 02/05/2016: ALT 19 08/27/2016: B Natriuretic Peptide 260.5 09/03/2016: Platelets 127 09/05/2016: Hemoglobin 10.9 01/16/2017: BUN 23; Creat 0.94; Magnesium 2.0; Potassium 4.4; Sodium 140    Lipid Panel    Component Value Date/Time   CHOL 118 08/18/2012 1137   TRIG 133.0 08/18/2012 1137   HDL 27.20 (L) 08/18/2012 1137   CHOLHDL 4 08/18/2012 1137   VLDL 26.6 08/18/2012 1137   LDLCALC 64 08/18/2012 1137      Wt Readings from Last 3 Encounters:  01/16/17 212 lb 3.2 oz (96.3 kg)  12/22/16 216 lb (98 kg)  12/05/16 214 lb (97.1 kg)      Other studies Reviewed: Additional studies/ records that were reviewed today include: None Review of the above records demonstrates:       ASSESSMENT AND PLAN:   MVR:  This was stable on his recent echo. No change in therapy.  AVR:  Stable on recent echo.  No change in therapy.  HTN:   The blood pressure is at target. No change in medications is indicated. We will continue with therapeutic lifestyle changes (TLC).  EDEMA:   I will check a BMET today.  Continue current diuretic dosing and conservative therapy.  ATRIAL FIB:    He tolerates this rhythm and has good rate control.   Current medicines  are reviewed at length with the patient today.  The patient does not have concerns regarding medicines.  The following changes have been made:  None  Labs/  tests ordered today include:    Orders Placed This Encounter  Procedures  . Basic Metabolic Panel (BMET)  . Magnesium  . EKG 12-Lead     Disposition:   FU with Bernerd Pho in two months.   Signed, [name], MD  01/18/2017 6:39 PM    [facility]

## 2017-01-16 ENCOUNTER — Ambulatory Visit (INDEPENDENT_AMBULATORY_CARE_PROVIDER_SITE_OTHER): Payer: Medicare HMO | Admitting: Cardiology

## 2017-01-16 ENCOUNTER — Ambulatory Visit (INDEPENDENT_AMBULATORY_CARE_PROVIDER_SITE_OTHER): Payer: Medicare HMO | Admitting: Pharmacist

## 2017-01-16 ENCOUNTER — Encounter: Payer: Self-pay | Admitting: Cardiology

## 2017-01-16 VITALS — BP 132/64 | HR 59 | Ht 61.5 in | Wt 212.2 lb

## 2017-01-16 DIAGNOSIS — I481 Persistent atrial fibrillation: Secondary | ICD-10-CM | POA: Diagnosis not present

## 2017-01-16 DIAGNOSIS — I482 Chronic atrial fibrillation, unspecified: Secondary | ICD-10-CM

## 2017-01-16 DIAGNOSIS — M7989 Other specified soft tissue disorders: Secondary | ICD-10-CM | POA: Diagnosis not present

## 2017-01-16 DIAGNOSIS — I4819 Other persistent atrial fibrillation: Secondary | ICD-10-CM

## 2017-01-16 DIAGNOSIS — I10 Essential (primary) hypertension: Secondary | ICD-10-CM

## 2017-01-16 DIAGNOSIS — Z79899 Other long term (current) drug therapy: Secondary | ICD-10-CM | POA: Diagnosis not present

## 2017-01-16 DIAGNOSIS — Z952 Presence of prosthetic heart valve: Secondary | ICD-10-CM

## 2017-01-16 DIAGNOSIS — Z5181 Encounter for therapeutic drug level monitoring: Secondary | ICD-10-CM

## 2017-01-16 DIAGNOSIS — I4891 Unspecified atrial fibrillation: Secondary | ICD-10-CM

## 2017-01-16 LAB — BASIC METABOLIC PANEL
BUN: 23 mg/dL (ref 7–25)
CHLORIDE: 101 mmol/L (ref 98–110)
CO2: 34 mmol/L — ABNORMAL HIGH (ref 20–31)
Calcium: 8.2 mg/dL — ABNORMAL LOW (ref 8.6–10.3)
Creat: 0.94 mg/dL (ref 0.70–1.33)
Glucose, Bld: 89 mg/dL (ref 65–99)
POTASSIUM: 4.4 mmol/L (ref 3.5–5.3)
Sodium: 140 mmol/L (ref 135–146)

## 2017-01-16 LAB — MAGNESIUM: Magnesium: 2 mg/dL (ref 1.5–2.5)

## 2017-01-16 LAB — POCT INR: INR: 2.8

## 2017-01-16 NOTE — Patient Instructions (Signed)
Medication Instructions:  Continue current medications  Labwork: BMP and Magnesium  Testing/Procedures: None Ordered  Follow-Up: Your physician recommends that you schedule a follow-up appointment in: 2 Months with [place].   Any Other Special Instructions Will Be Listed Below (If Applicable).   If you need a refill on your cardiac medications before your next appointment, please call your pharmacy.

## 2017-01-18 ENCOUNTER — Encounter: Payer: Self-pay | Admitting: Cardiology

## 2017-02-06 ENCOUNTER — Other Ambulatory Visit: Payer: Self-pay | Admitting: Cardiology

## 2017-02-06 ENCOUNTER — Ambulatory Visit (INDEPENDENT_AMBULATORY_CARE_PROVIDER_SITE_OTHER): Payer: Medicare HMO | Admitting: Pharmacist

## 2017-02-06 DIAGNOSIS — Z952 Presence of prosthetic heart valve: Secondary | ICD-10-CM

## 2017-02-06 DIAGNOSIS — I4891 Unspecified atrial fibrillation: Secondary | ICD-10-CM | POA: Diagnosis not present

## 2017-02-06 DIAGNOSIS — I4819 Other persistent atrial fibrillation: Secondary | ICD-10-CM

## 2017-02-06 DIAGNOSIS — Z5181 Encounter for therapeutic drug level monitoring: Secondary | ICD-10-CM

## 2017-02-06 DIAGNOSIS — I482 Chronic atrial fibrillation, unspecified: Secondary | ICD-10-CM

## 2017-02-06 DIAGNOSIS — I481 Persistent atrial fibrillation: Secondary | ICD-10-CM | POA: Diagnosis not present

## 2017-02-06 LAB — POCT INR: INR: 2.1

## 2017-02-06 MED FILL — METHOCARBAMOL 500 MG TABLET: 500 | 90 days supply | Qty: 270 | Fill #0

## 2017-02-06 MED FILL — DIGOXIN 250 MCG TABLET: 250 | 30 days supply | Qty: 30 | Fill #2

## 2017-02-25 ENCOUNTER — Telehealth: Payer: Self-pay | Admitting: Pain Medicine

## 2017-02-25 ENCOUNTER — Other Ambulatory Visit: Payer: Self-pay | Admitting: Nurse Practitioner

## 2017-02-25 DIAGNOSIS — G894 Chronic pain syndrome: Secondary | ICD-10-CM

## 2017-02-25 MED ORDER — OXYCODONE HCL 5 MG PO TABS: 5.0000 mg | ORAL_TABLET | Freq: Three times a day (TID) | ORAL | 0 refills | Status: DC | PRN

## 2017-02-25 MED ORDER — OXYCODONE HCL 5 MG PO TABS: 5.0000 mg | ORAL_TABLET | Freq: Two times a day (BID) | ORAL | 0 refills | Status: DC

## 2017-02-25 NOTE — Telephone Encounter (Signed)
Hi Please have him to return with those 2 Rx and I will correct them. Please apologize to the patient the inconvenience. I'm not sure what happened. Thanks

## 2017-02-25 NOTE — Telephone Encounter (Signed)
Crystal, please advise. 

## 2017-02-25 NOTE — Telephone Encounter (Signed)
Patient returned one prescription and brought pills for count from [prescription he got filled yesterday with the quantity of #30.  Received a script for oxycodone 5 mg #60 and a script for oxycodone 5 mg #90 to last until August.  Informed patient that their pharmacy told me that when the #30 ran out, he could fill the #60 but would have to pay a new copay.

## 2017-02-25 NOTE — Telephone Encounter (Signed)
Patient called stating he got 3 scripts, the first one was for 90 pills, the 2nd and 3rd are only for 30 pills, please call patient to discuss problem with scripts

## 2017-02-27 ENCOUNTER — Ambulatory Visit (INDEPENDENT_AMBULATORY_CARE_PROVIDER_SITE_OTHER): Payer: Medicare HMO | Admitting: Pharmacist

## 2017-02-27 ENCOUNTER — Other Ambulatory Visit: Payer: Medicare HMO

## 2017-02-27 DIAGNOSIS — Z5181 Encounter for therapeutic drug level monitoring: Secondary | ICD-10-CM

## 2017-02-27 DIAGNOSIS — I4891 Unspecified atrial fibrillation: Secondary | ICD-10-CM | POA: Diagnosis not present

## 2017-02-27 LAB — POCT INR: INR: 2.8

## 2017-02-28 LAB — BASIC METABOLIC PANEL
BUN / CREAT RATIO: 23 — AB (ref 9–20)
BUN: 19 mg/dL (ref 6–24)
CHLORIDE: 101 mmol/L (ref 96–106)
CO2: 28 mmol/L (ref 20–29)
Calcium: 7.9 mg/dL — ABNORMAL LOW (ref 8.7–10.2)
Creatinine, Ser: 0.84 mg/dL (ref 0.76–1.27)
GFR calc Af Amer: 114 mL/min/{1.73_m2} (ref >59)
GFR calc non Af Amer: 99 mL/min/{1.73_m2} (ref >59)
GLUCOSE: 70 mg/dL (ref 65–99)
Potassium: 4.2 mmol/L (ref 3.5–5.2)
SODIUM: 139 mmol/L (ref 134–144)

## 2017-03-07 ENCOUNTER — Other Ambulatory Visit: Payer: Self-pay | Admitting: Cardiology

## 2017-03-09 ENCOUNTER — Telehealth: Payer: Self-pay | Admitting: Pain Medicine

## 2017-03-09 NOTE — Telephone Encounter (Signed)
Patient called to let us know he had to go to different [facility] on [address]

## 2017-03-17 MED FILL — DIGOXIN 250 MCG TABLET: 250 | 30 days supply | Qty: 30 | Fill #3

## 2017-03-23 ENCOUNTER — Other Ambulatory Visit: Payer: Self-pay | Admitting: Cardiology

## 2017-03-23 MED ORDER — POTASSIUM CHLORIDE ER 10 MEQ PO TBCR: 20.0000 meq | EXTENDED_RELEASE_TABLET | Freq: Two times a day (BID) | ORAL | 6 refills | Status: DC

## 2017-03-23 MED ORDER — FUROSEMIDE 80 MG PO TABS
80.0000 mg | ORAL_TABLET | Freq: Two times a day (BID) | ORAL | 6 refills | Status: DC
Start: 2017-03-23 — End: 2017-04-21

## 2017-03-23 NOTE — Telephone Encounter (Signed)
Rx's sent over to pharmacy; patient directly notified

## 2017-03-23 NOTE — Telephone Encounter (Signed)
New message     *STAT* If patient is at the pharmacy, call can be transferred to refill team.   1. Which medications need to be refilled? (please list name of each medication and dose if known) furosemide 80 mg, potassium chloride 10 meq  2. Which pharmacy/location (including street and city if local pharmacy) is medication to be sent to? [facility] on [address]  3. Do they need a 30 day or 90 day supply? 30 day

## 2017-03-27 ENCOUNTER — Ambulatory Visit (INDEPENDENT_AMBULATORY_CARE_PROVIDER_SITE_OTHER): Payer: Medicare HMO | Admitting: Pharmacist

## 2017-03-27 DIAGNOSIS — I4891 Unspecified atrial fibrillation: Secondary | ICD-10-CM | POA: Diagnosis not present

## 2017-03-27 DIAGNOSIS — Z5181 Encounter for therapeutic drug level monitoring: Secondary | ICD-10-CM

## 2017-03-27 LAB — POCT INR: INR: 2.5

## 2017-03-29 NOTE — Progress Notes (Signed)
Cardiology Office Note    Date:  03/31/2017   ID:  [name], DOB 02-10-1961, MRN [id number]  PCP:  [name], PA-C  Cardiologist: Dr. [name]   Chief Complaint  Patient presents with  . Follow-up    2 months    History of Present Illness:    Lance Cook is a 56 y.o. male with past medical history of AVR (s/p Bentall procedure in 1988), severe mitral regurgitation (s/p St Jude mechanical valve placed in 12/2014), CAD (s/p SVG-RCA, known to be occluded by cath in 12/2014), chronic atrial fibrillation, chronic diastolic CHF (EF 76-72% by echo in 12/2015), prior CVA, chronic venous insufficiency, and OSA (on CPAP) who presents to the office today for 60-month follow-up.   He was last examined by Dr. [name] in 12/2016 and was noted to have worsening lower extremity edema but weight remained stable on the office scales at 212 lbs (baseline of 212 - 213 lbs). Labs were checked at that time and showed a stable creatinine of 0.84 and K+ at 4.2, therefore he was continued on his current medication regimen.   In talking with the patient today, he reports overall doing well since his last office visit. Weight fluctuates greatly as he was 218 lbs last week but is down to 207 lbs today. Has worsening cramping along his chest and legs when weight is at the lower end of normal. He reports taking variable doses of Lasix depending on what his schedule for the day looks like. Weight tends to trend upwards when he does not take his full Lasix 80 mg BID but returns to baseline when he is compliant with this and elevates his legs. Has baseline dyspnea on exertion. No recent orthopnea or PND.   He reports good compliance with his Coumadin and denies any evidence of active bleeding. No recent chest pain or palpitations. Does note an occasional "hard-spot" along his sternum which does not cause pain but is more noticeable at that time.   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    Past Medical History:  Diagnosis Date  . Anemia   . Anxiety   . ARDS (adult respiratory distress syndrome) ([place]) 01/27/2015  . Arthritis   . Ascending aortic dissection ([place]) 07/14/2008   Localized dissection of ascending aorta noted on CTA in 2009 and stable on CTA in 2011  . Asymptomatic chronic venous hypertension 01/15/2010   Overview:  Overview:  Qualifier: Diagnosis of  By: [name] MD, [name]   Last Assessment & Plan:  I suggested he go to a vein clinic to see if he could be qualified compression hose of some type, since he is not a surgical candidate according to the vascular surgeons.   . Atrial fibrillation ([place])    chronic persistent  . Bell's palsy   . CAD (coronary artery disease)    Old scar inferior wall myoview, 10/2009 EF 52%.  He did have previous SVG to RCA but no obstructive disease noted on his most recent cath.  SVG occluded.   . Cellulitis 04/02/2014  . Cerebral artery occlusion with cerebral infarction ([place]) 10/08/2011   Overview:  Overview:  And hx of TIA prior to CABG, all thought due to systemic emboli prior to coumadin   . Chronic diastolic heart failure ([place]) 03/01/2015   a. 12/2015: echo showing a preserved EF of 65-70%, moderate AS, and moderate TR.   Marland Kitchen Chronic LBP 10/08/2011  . CVA (  cerebral vascular accident) ([place]) 10/08/2011   And hx of TIA prior to CABG, all thought due to systemic emboli prior to coumadin   . DIABETES MELLITUS, TYPE II 11/01/2009   Qualifier: Diagnosis of  By: [name] MD, [name]    . Diverticulosis   . 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 Encephalopathy acute 02/05/2015  . GERD (gastroesophageal reflux disease)    not needing medication at thhis time- 01/22/15  . Gout   . H/O mechanical aortic valve replacement 11/01/2009   Qualifier: Diagnosis of  By: [name], [name]      . Hiatal hernia   . History of colon polyps 12/23/2011   Overview:  Overview:  Colonoscopy January 2010, 6 mm rectal tubulovillous adenoma. No high-grade dysplasia   . Hyperlipidemia   . Hypertension   . Hypogonadism male 04/08/2012  . Impaired glucose tolerance 10/08/2011  . Morbid obesity ([place]) 04/02/2014  . Myocardial infarction [facility]) age 77  . OSA (obstructive sleep apnea)    CPAP  . Peripheral vascular disease ([place])   . 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 01/02/5464   33 mm St Jude bileaflet mechanical valve placed via right mini thoracotomy approach  . 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    1988 - Dr Blase Mess at [facility] in [place], [place]  . Severe mitral regurgitation 10/11/2014  . Testicular cancer [facility])    He was 56 y/o. He had surgical resection and rad tx's.   Marland Kitchen TIA (transient ischemic attack)    age 75  . Varicose veins     Past Surgical History:  Procedure Laterality Date  . aortic truck    . BENTALL PROCEDURE  1988   25 mm St Jude mechanical valve conduit - Dr Blase Mess at [facility] in [place], [place]  . CHOLECYSTECTOMY  2011  . COLONOSCOPY WITH PROPOFOL N/A 06/17/2016   Procedure: COLONOSCOPY WITH PROPOFOL;  Surgeon: Jerene Bears, MD;  Location: [facility] ENDOSCOPY;  Service: Gastroenterology;  Laterality: N/A;  . CORONARY ARTERY BYPASS GRAFT    . LASER ABLATION Left 03/06/2010   leg  . MITRAL VALVE REPLACEMENT Right 01/24/2015   Procedure: Re-Operation, MINIMALLY INVASIVE MITRAL VALVE (MV) REPLACEMENT.;  Surgeon: Rexene Alberts, MD;  Location: [place];  Service: Open Heart Surgery;  Laterality: Right;  . ORCHIECTOMY  age 82   testicular cancer  . OTOPLASTY     bilateral, age 8  . TEE WITHOUT CARDIOVERSION N/A 10/11/2014   Procedure: TRANSESOPHAGEAL ECHOCARDIOGRAM (TEE);  Surgeon: Pixie Casino, MD;  Location: [facility] ENDOSCOPY;  Service:  Cardiovascular;  Laterality: N/A;  . TEE WITHOUT CARDIOVERSION N/A 01/24/2015   Procedure: TRANSESOPHAGEAL ECHOCARDIOGRAM (TEE);  Surgeon: Rexene Alberts, MD;  Location: [place];  Service: Open Heart Surgery;  Laterality: N/A;  . TONSILLECTOMY  1967  . TOOTH EXTRACTION  2016    Current Medications: Outpatient Medications Prior to Visit  Medication Sig Dispense Refill  . acetaminophen (TYLENOL) 500 MG tablet Take 500 mg by mouth every 8 (eight) hours as needed for moderate pain or headache.     . allopurinol (ZYLOPRIM) 300 MG tablet Take 300 mg by mouth daily.    Marland Kitchen atenolol (TENORMIN) 25 MG tablet Take 1 tablet (25 mg total) by mouth 2 (  two) times daily.    . bacitracin 500 UNIT/GM ointment Apply 1 application topically as needed for wound care.     . digoxin (LANOXIN) 0.25 MG tablet Take 1 tablet (0.25 mg total) by mouth daily. 90 tablet 3  . furosemide (LASIX) 80 MG tablet Take 1 tablet (80 mg total) by mouth 2 (two) times daily. 60 tablet 6  . magnesium oxide (MAG-OX) 400 (241.3 MG) MG tablet Take 1 tablet (400 mg total) by mouth 2 (two) times daily. 180 tablet 3  . methocarbamol (ROBAXIN) 500 MG tablet Take 1 tablet (500 mg total) by mouth 3 (three) times daily. 270 tablet 0  . oxyCODONE (OXY IR/ROXICODONE) 5 MG immediate release tablet Take 1 tablet (5 mg total) by mouth every 8 (eight) hours as needed for severe pain. 90 tablet 0  . potassium chloride (K-DUR) 10 MEQ tablet Take 2 tablets (20 mEq total) by mouth 2 (two) times daily. 120 tablet 6  . temazepam (RESTORIL) 15 MG capsule Take 15 mg by mouth at bedtime as needed for sleep.    Marland Kitchen warfarin (COUMADIN) 5 MG tablet TAKE 1 TO 1 AND 1/2 TABLETS DAILY AS DIRECTED BY COUMADIN CLINIC 135 tablet 1   No facility-administered medications prior to visit.      Allergies:   Penicillins; Adhesive [tape]; and Bactrim [sulfamethoxazole-trimethoprim]   Social History   Social History  . Marital status: Married    Spouse name: N/A  . Number  of children: 0  . Years of education: N/A   Occupational History  . Disabled Optician Unemployed   Social History Main Topics  . Smoking status: Light Tobacco Smoker    Types: Cigars  . Smokeless tobacco: Never Used     Comment: about 3 yearly- cigar  . Alcohol use 0.0 oz/week     Comment: social  . Drug use: No  . Sexual activity: Not Asked   Other Topics Concern  . None   Social History Narrative  . None     Family History:  The patient's family history includes Cancer in his maternal aunt and maternal uncle; Diabetes in his father; Heart disease in his mother; Hypertension in his other; Other in his mother; Stroke in his other; Throat cancer in his mother.   Review of Systems:   Please see the history of present illness.     General:  No chills, fever or  night sweats. Positive for weight changes.  Cardiovascular:  No chest pain, dyspnea on exertion, orthopnea, palpitations, paroxysmal nocturnal dyspnea. Positive for edema.  Dermatological: No rash, lesions/masses Respiratory: No cough, dyspnea Urologic: No hematuria, dysuria Abdominal:   No nausea, vomiting, diarrhea, bright red blood per rectum, melena, or hematemesis Neurologic:  No visual changes, wkns, changes in mental status.  All other systems reviewed and are otherwise negative except as noted above.   Physical Exam:    VS:  BP 126/70   Pulse 70   Ht 5\' 1"  (1.549 m)   Wt 207 lb (93.9 kg)   SpO2 99%   BMI 39.11 kg/m    General: Well developed, well nourished Caucasian male appearing in no acute distress. Head: Normocephalic, atraumatic, sclera non-icteric, no xanthomas, nares are without discharge.  Neck: No carotid bruits. JVD not elevated.  Lungs: Respirations regular and unlabored, without wheezes or rales.  Heart: Irregularly irregular. No S3 or S4.  No murmur, no rubs, or gallops appreciated. Crisp mechanical valve sounds appreciated.  Abdomen: Soft, non-tender, non-distended with normoactive bowel  sounds.  No hepatomegaly. No rebound/guarding. No obvious abdominal masses. Msk:  Strength and tone appear normal for age. No joint deformities or effusions. Extremities: No clubbing or cyanosis. Chronic lower extremity edema (improved when compared to prior examinations).  Distal pedal pulses are 2+ bilaterally. Neuro: Alert and oriented X 3. Moves all extremities spontaneously. No focal deficits noted. Psych:  Responds to questions appropriately with a normal affect. Skin: No rashes or lesions noted  Wt Readings from Last 3 Encounters:  03/31/17 207 lb (93.9 kg)  01/16/17 212 lb 3.2 oz (96.3 kg)  12/22/16 216 lb (98 kg)     Studies/Labs Reviewed:   EKG:  EKG is not ordered today.    Recent Labs: 08/27/2016: B Natriuretic Peptide 260.5 09/03/2016: Platelets 127 09/05/2016: Hemoglobin 10.9 01/16/2017: Magnesium 2.0 02/27/2017: BUN 19; Creatinine, Ser 0.84; Potassium 4.2; Sodium 139   Lipid Panel    Component Value Date/Time   CHOL 118 08/18/2012 1137   TRIG 133.0 08/18/2012 1137   HDL 27.20 (L) 08/18/2012 1137   CHOLHDL 4 08/18/2012 1137   VLDL 26.6 08/18/2012 1137   LDLCALC 64 08/18/2012 1137    Additional studies/ records that were reviewed today include:   Echocardiogram: 01/17/2016 Study Conclusions  - Left ventricle: The cavity size was normal. There was severe   concentric hypertrophy. Systolic function was vigorous. The   estimated ejection fraction was in the range of 65% to 70%. Wall   motion was normal; there were no regional wall motion   abnormalities. The study is not technically sufficient to allow   evaluation of LV diastolic function. - Aortic valve: A mechanical prosthesis was present and functioning   normally. Peak velocity (S): 345 cm/s. Mean gradient (S): 28 mm   Hg. (Consistent with moderate aortic stenosis gradient) - Aorta: Post Bentall with ST Jude Mechanical Valve in 1988. Aortic   arch diameter: 40 mm. - Aortic arch: The aortic arch was mildly  dilated. - Mitral valve: A mechanical prosthesis was present and functioning   normally. - Left atrium: The atrium was severely dilated. - Right ventricle: The cavity size was mildly dilated. Wall   thickness was normal. - Right atrium: The atrium was moderately dilated. - Atrial septum: The septum bowed from left to right, consistent   with increased left atrial pressure. - Tricuspid valve: There was moderate regurgitation. - Pulmonary arteries: Systolic pressure was moderately to severely   increased. PA peak pressure: 67 mm Hg (S).  Impressions:  - There was no evidence of a vegetation.  Assessment:    1. Chronic diastolic congestive heart failure ([place])   2. H/O mechanical aortic valve replacement   3. Coronary artery disease involving coronary bypass graft of native heart without angina pectoris   4. Persistent atrial fibrillation ([place])   5. OSA (obstructive sleep apnea)      Plan:   In order of problems listed above:  1. Chronic Diastolic CHF - echo in 66/4403 showed a preserved EF of 65-70%. His volume status appears at baseline by examination today, honestly the best his volume status has looked in months. Previous baseline weight was thought to be between  212 - 213 lbs, but weight is down to 207 lbs today. He reports fluctuating weights at home which is dependent on if he takes his prescribed Lasix dosing or cuts this in half. - BMET on 02/27/2017 showed K+ of 4.2 with creatinine at 0.84. He requests to have his next BMET at the time of his next INR check.  Will place order for future BMET today.Continue with Lasix 80mg  BID.    2. History of Mechanical Valve - s/p AVR with Bentall procedure in 1988 and s/p St Jude mechanical valve placed in 12/2014.  - echo in 2017 showed moderate AS and normal functioning of the mitral mechanical prosthesis. - most recent INR on 7/27 was at 2.5 (goal 2.5-3.5). Continue Coumadin.    3. CAD - s/p SVG-RCA, known to be occluded by cath  in 12/2014. - he has baseline dyspnea on exertion but denies any acute worsening of this. No recent chest pain. He does point out today an area along his sternum which is tender to palpation, appears to be the location of one of his projecting sternal wires (remains below the surface of the skin).  - continue BB. No ASA secondary to the need for Coumadin.   4. Persistent Atrial Fibrillation - he denies any recent palpitations. HR is well-controlled in the 70's during today's visit. Continue Digoxin 0.25mg  daily and Atenolol 25mg  BID for rate-control. - he denies any evidence of active bleeding. Continue Coumadin for anticoagulation.    5. OSA  - continued compliance with CPAP encouraged.     Medication Adjustments/Labs and Tests Ordered: Current medicines are reviewed at length with the patient today.  Concerns regarding medicines are outlined above.  Medication changes, Labs and Tests ordered today are listed in the Patient Instructions below. Patient Instructions  Medication Instructions: Your physician recommends that you continue on your current medications as directed. Please refer to the Current Medication list given to you today.  If you need a refill on your cardiac medications before your next appointment, please call your pharmacy.    Follow-Up: Your physician wants you to follow-up in: 2 months with Dr. [name].   Thank you for choosing Heartcare at [facility]!!      Signed, Erma Heritage, PA-C  03/31/2017 1:11 PM    [address], [place], [place]  [address] Phone: [phone]; Fax: [phone]  [address], [place], [place] [address] Phone: [phone]

## 2017-03-31 ENCOUNTER — Other Ambulatory Visit: Payer: Self-pay | Admitting: Student

## 2017-03-31 ENCOUNTER — Encounter: Payer: Self-pay | Admitting: Student

## 2017-03-31 ENCOUNTER — Ambulatory Visit (INDEPENDENT_AMBULATORY_CARE_PROVIDER_SITE_OTHER): Payer: Medicare HMO | Admitting: Student

## 2017-03-31 VITALS — BP 126/70 | HR 70 | Ht 61.0 in | Wt 207.0 lb

## 2017-03-31 DIAGNOSIS — Z79899 Other long term (current) drug therapy: Secondary | ICD-10-CM

## 2017-03-31 DIAGNOSIS — I5032 Chronic diastolic (congestive) heart failure: Secondary | ICD-10-CM | POA: Diagnosis not present

## 2017-03-31 DIAGNOSIS — Z952 Presence of prosthetic heart valve: Secondary | ICD-10-CM

## 2017-03-31 DIAGNOSIS — G4733 Obstructive sleep apnea (adult) (pediatric): Secondary | ICD-10-CM | POA: Diagnosis not present

## 2017-03-31 DIAGNOSIS — I481 Persistent atrial fibrillation: Secondary | ICD-10-CM | POA: Diagnosis not present

## 2017-03-31 DIAGNOSIS — I4819 Other persistent atrial fibrillation: Secondary | ICD-10-CM

## 2017-03-31 DIAGNOSIS — I25810 Atherosclerosis of coronary artery bypass graft(s) without angina pectoris: Secondary | ICD-10-CM | POA: Diagnosis not present

## 2017-03-31 NOTE — Patient Instructions (Signed)
Medication Instructions: Your physician recommends that you continue on your current medications as directed. Please refer to the Current Medication list given to you today.  If you need a refill on your cardiac medications before your next appointment, please call your pharmacy.    Follow-Up: Your physician wants you to follow-up in: 2 months with Dr. [name].    Thank you for choosing Heartcare at [facility]!!

## 2017-04-13 ENCOUNTER — Ambulatory Visit: Payer: Medicare HMO | Attending: Nurse Practitioner | Admitting: Nurse Practitioner

## 2017-04-13 ENCOUNTER — Encounter: Payer: Self-pay | Admitting: Nurse Practitioner

## 2017-04-13 VITALS — BP 142/73 | HR 79 | Temp 97.9°F | Resp 16 | Ht 61.5 in | Wt 203.0 lb

## 2017-04-13 DIAGNOSIS — R590 Localized enlarged lymph nodes: Secondary | ICD-10-CM | POA: Insufficient documentation

## 2017-04-13 DIAGNOSIS — M4696 Unspecified inflammatory spondylopathy, lumbar region: Secondary | ICD-10-CM | POA: Diagnosis not present

## 2017-04-13 DIAGNOSIS — R918 Other nonspecific abnormal finding of lung field: Secondary | ICD-10-CM | POA: Insufficient documentation

## 2017-04-13 DIAGNOSIS — D649 Anemia, unspecified: Secondary | ICD-10-CM | POA: Insufficient documentation

## 2017-04-13 DIAGNOSIS — I4891 Unspecified atrial fibrillation: Secondary | ICD-10-CM | POA: Insufficient documentation

## 2017-04-13 DIAGNOSIS — K449 Diaphragmatic hernia without obstruction or gangrene: Secondary | ICD-10-CM | POA: Insufficient documentation

## 2017-04-13 DIAGNOSIS — I2510 Atherosclerotic heart disease of native coronary artery without angina pectoris: Secondary | ICD-10-CM | POA: Diagnosis not present

## 2017-04-13 DIAGNOSIS — Z7901 Long term (current) use of anticoagulants: Secondary | ICD-10-CM | POA: Diagnosis not present

## 2017-04-13 DIAGNOSIS — M109 Gout, unspecified: Secondary | ICD-10-CM | POA: Diagnosis not present

## 2017-04-13 DIAGNOSIS — M48061 Spinal stenosis, lumbar region without neurogenic claudication: Secondary | ICD-10-CM

## 2017-04-13 DIAGNOSIS — I5032 Chronic diastolic (congestive) heart failure: Secondary | ICD-10-CM | POA: Insufficient documentation

## 2017-04-13 DIAGNOSIS — Z6839 Body mass index (BMI) 39.0-39.9, adult: Secondary | ICD-10-CM | POA: Insufficient documentation

## 2017-04-13 DIAGNOSIS — E119 Type 2 diabetes mellitus without complications: Secondary | ICD-10-CM | POA: Insufficient documentation

## 2017-04-13 DIAGNOSIS — F419 Anxiety disorder, unspecified: Secondary | ICD-10-CM | POA: Diagnosis not present

## 2017-04-13 DIAGNOSIS — M47899 Other spondylosis, site unspecified: Secondary | ICD-10-CM | POA: Diagnosis not present

## 2017-04-13 DIAGNOSIS — I739 Peripheral vascular disease, unspecified: Secondary | ICD-10-CM | POA: Insufficient documentation

## 2017-04-13 DIAGNOSIS — G894 Chronic pain syndrome: Secondary | ICD-10-CM | POA: Diagnosis not present

## 2017-04-13 DIAGNOSIS — I252 Old myocardial infarction: Secondary | ICD-10-CM | POA: Insufficient documentation

## 2017-04-13 DIAGNOSIS — M791 Myalgia: Secondary | ICD-10-CM | POA: Diagnosis not present

## 2017-04-13 DIAGNOSIS — G9340 Encephalopathy, unspecified: Secondary | ICD-10-CM | POA: Insufficient documentation

## 2017-04-13 DIAGNOSIS — M7989 Other specified soft tissue disorders: Secondary | ICD-10-CM | POA: Diagnosis not present

## 2017-04-13 DIAGNOSIS — M2578 Osteophyte, vertebrae: Secondary | ICD-10-CM | POA: Diagnosis not present

## 2017-04-13 DIAGNOSIS — I110 Hypertensive heart disease with heart failure: Secondary | ICD-10-CM | POA: Insufficient documentation

## 2017-04-13 DIAGNOSIS — Z951 Presence of aortocoronary bypass graft: Secondary | ICD-10-CM | POA: Insufficient documentation

## 2017-04-13 DIAGNOSIS — Z86010 Personal history of colonic polyps: Secondary | ICD-10-CM | POA: Diagnosis not present

## 2017-04-13 DIAGNOSIS — E042 Nontoxic multinodular goiter: Secondary | ICD-10-CM | POA: Diagnosis not present

## 2017-04-13 DIAGNOSIS — Z952 Presence of prosthetic heart valve: Secondary | ICD-10-CM | POA: Insufficient documentation

## 2017-04-13 DIAGNOSIS — J3801 Paralysis of vocal cords and larynx, unilateral: Secondary | ICD-10-CM | POA: Diagnosis not present

## 2017-04-13 DIAGNOSIS — G4733 Obstructive sleep apnea (adult) (pediatric): Secondary | ICD-10-CM | POA: Diagnosis not present

## 2017-04-13 DIAGNOSIS — E785 Hyperlipidemia, unspecified: Secondary | ICD-10-CM | POA: Insufficient documentation

## 2017-04-13 DIAGNOSIS — M545 Low back pain: Secondary | ICD-10-CM | POA: Diagnosis present

## 2017-04-13 DIAGNOSIS — E669 Obesity, unspecified: Secondary | ICD-10-CM | POA: Insufficient documentation

## 2017-04-13 DIAGNOSIS — I219 Acute myocardial infarction, unspecified: Secondary | ICD-10-CM | POA: Insufficient documentation

## 2017-04-13 DIAGNOSIS — Z8673 Personal history of transient ischemic attack (TIA), and cerebral infarction without residual deficits: Secondary | ICD-10-CM | POA: Insufficient documentation

## 2017-04-13 DIAGNOSIS — G510 Bell's palsy: Secondary | ICD-10-CM | POA: Insufficient documentation

## 2017-04-13 DIAGNOSIS — M7918 Myalgia, other site: Secondary | ICD-10-CM

## 2017-04-13 DIAGNOSIS — K219 Gastro-esophageal reflux disease without esophagitis: Secondary | ICD-10-CM | POA: Insufficient documentation

## 2017-04-13 DIAGNOSIS — M47816 Spondylosis without myelopathy or radiculopathy, lumbar region: Secondary | ICD-10-CM

## 2017-04-13 DIAGNOSIS — J80 Acute respiratory distress syndrome: Secondary | ICD-10-CM | POA: Insufficient documentation

## 2017-04-13 MED ORDER — OXYCODONE HCL 5 MG PO CAPS: 5.0000 mg | ORAL_CAPSULE | Freq: Three times a day (TID) | ORAL | 0 refills | Status: DC | PRN

## 2017-04-13 MED ORDER — OXYCODONE HCL 5 MG PO TABS: 5.0000 mg | ORAL_TABLET | Freq: Three times a day (TID) | ORAL | 0 refills | Status: DC | PRN

## 2017-04-13 MED ORDER — METHOCARBAMOL 500 MG PO TABS: 500.0000 mg | ORAL_TABLET | Freq: Three times a day (TID) | ORAL | 0 refills | Status: DC

## 2017-04-13 MED FILL — METHOCARBAMOL 500 MG TABLET: 500 | 90 days supply | Qty: 270 | Fill #0

## 2017-04-13 NOTE — Progress Notes (Signed)
Patient's Name: Lance Cook  MRN: [id number]  Referring Provider: [name], PA-C  DOB: May 13, 1961  PCP: Selinda Orion  DOS: 04/13/2017  Note by: Vevelyn Francois NP  Service setting: Ambulatory outpatient  Specialty: Interventional Pain Management  Location: [facility] (AMB) Pain Management Facility    Patient type: Established    Primary Reason(s) for Visit: Encounter for prescription drug management. (Level of risk: moderate)  CC: Back Pain (lower)  HPI  [name] is a 56 y.o. year old, male patient, who comes today for a medication management evaluation. He has Chronic venous hypertension with ulcer ([place]); PERIPHERAL EDEMA;  Lumbar spinal stenosis (Severe L4-5);Chronic low back pain (midline); Lumbar facet syndrome;  Lumbar canal stenosis;  Chronic lower extremity pain (Left); Musculoskeletal pain; Chronic pain syndrome; Low back pain; Lymphadenopathy; Obesity (BMI 30-39.9);Spinal stenosis of lumbar region without neurogenic claudication; Spondylosis; and Leg swelling; on his problem list. His primarily concern today is the Back Pain (lower)  Pain Assessment: Location: Lower Back Radiating: na Onset: More than a month ago Duration: Chronic pain Quality: Pins and needles ("feeling like a knife") Severity: 7 /10 (self-reported pain score)  Note: Reported level is compatible with observation.                   Effect on ADL: tying shoes, walking dogs, Timing: Intermittent Modifying factors: medications  [name] was last scheduled for an appointment on 12/22/2016 for medication management. During today's appointment we reviewed [name]'s chronic pain status, as well as his outpatient medication regimen. He is having lymphedema. He has ben using cortisone cream and bacterium that is not effective. He is having ulceration to his legs that are getting painful. He was been seen by vascular.   The patient  reports that he does not use drugs. His body mass index is 37.74 kg/m.  Further  details on both, my assessment(s), as well as the proposed treatment plan, please see below.  Controlled Substance Pharmacotherapy Assessment REMS (Risk Evaluation and Mitigation Strategy)  Analgesic:Oxycodone IR 49m q8hrs (15 mg/day of oxycodone) MME/day:22.5 mg/day GIgnatius Specking RN  04/13/2017  1:24 PM  Sign at close encounter Nursing Pain Medication Assessment:  Safety precautions to be maintained throughout the outpatient stay will include: orient to surroundings, keep bed in low position, maintain call bell within reach at all times, provide assistance with transfer out of bed and ambulation.  Medication Inspection Compliance: Pill count conducted under aseptic conditions, in front of the patient. Neither the pills nor the bottle was removed from the patient's sight at any time. Once count was completed pills were immediately returned to the patient in their original bottle.  Medication: See above Pill/Patch Count: 43 of 90 pills remain Pill/Patch Appearance: Markings consistent with prescribed medication Bottle Appearance: Standard pharmacy container. Clearly labeled. Filled Date: 7 / 27 / 2018 Last Medication intake:  Today   Pharmacokinetics: Liberation and absorption (onset of action): WNL Distribution (time to peak effect): WNL Metabolism and excretion (duration of action): WNL         Pharmacodynamics: Desired effects: Analgesia: Mr. CAppeltreports >50% benefit. Functional ability: Patient reports that medication allows him to accomplish basic ADLs Clinically meaningful improvement in function (CMIF): Sustained CMIF goals met Perceived effectiveness: Described as relatively effective, allowing for increase in activities of daily living (ADL) Undesirable effects: Side-effects or Adverse reactions: None reported Monitoring: [place] PMP: Online review of the past 126-montheriod conducted. Compliant with practice rules and regulations List of  all UDS test(s) done:  Lab  Results  Component Value Date   TOXASSSELUR FINAL 01/07/2016   TOXASSSELUR FINAL 10/09/2015   TOXASSSELUR FINAL 07/11/2015   SUMMARY FINAL 12/22/2016   Last UDS on record: [employer] Select 13  Date Value Ref Range Status  01/07/2016 FINAL  Final    Comment:    ==================================================================== TOXASSURE SELECT 13 (MW) ==================================================================== Test                             Result       Flag       Units Drug Present and Declared for Prescription Verification   Oxazepam                       782          EXPECTED   ng/mg creat   Temazepam                      10809        EXPECTED   ng/mg creat    Oxazepam and temazepam are expected metabolites of diazepam.    Oxazepam is also an expected metabolite of other benzodiazepine    drugs, including chlordiazepoxide, prazepam, clorazepate,    halazepam, and temazepam.  Oxazepam and temazepam are available    as scheduled prescription medications.   Oxycodone                      1568         EXPECTED   ng/mg creat   Oxymorphone                    305          EXPECTED   ng/mg creat   Noroxycodone                   5650         EXPECTED   ng/mg creat    Sources of oxycodone include scheduled prescription medications.    Oxymorphone and noroxycodone are expected metabolites of    oxycodone. Oxymorphone is also available as a scheduled    prescription medication. ==================================================================== Test                      Result    Flag   Units      Ref Range   Creatinine              22               mg/dL      >=20 ==================================================================== Declared Medications:  The flagging and interpretation on this report are based on the  following declared medications.  Unexpected results may arise from  inaccuracies in the declared medications.  **Note: The testing scope of this panel  includes these medications:  Oxycodone (Oxy-IR)  Temazepam (Restoril)  **Note: The testing scope of this panel does not include following  reported medications:  Allopurinol (Zyloprim)  Atenolol (Tenormin)  Calcium (Calcium/Magnesium/Zinc)  Clindamycin (Cleocin)  Furosemide (Lasix)  Magnesium (Calcium/Magnesium/Zinc)  Magnesium (Mag-Ox)  Methocarbamol (Robaxin)  Potassium (K-Dur)  Topical  Topical (Silvadene)  Vitamin B  Warfarin (Coumadin)  Zinc (Calcium/Magnesium/Zinc) ==================================================================== For clinical consultation, please call [phone]. ====================================================================    Summary  Date Value Ref Range Status  12/22/2016 FINAL  Final    Comment:    ====================================================================  TOXASSURE SELECT 13 (MW) ==================================================================== Test                             Result       Flag       Units Drug Present and Declared for Prescription Verification   Oxazepam                       364          EXPECTED   ng/mg creat   Temazepam                      7482         EXPECTED   ng/mg creat    Oxazepam and temazepam are expected metabolites of diazepam.    Oxazepam is also an expected metabolite of other benzodiazepine    drugs, including chlordiazepoxide, prazepam, clorazepate,    halazepam, and temazepam.  Oxazepam and temazepam are available    as scheduled prescription medications.   Oxycodone                      677          EXPECTED   ng/mg creat   Oxymorphone                    277          EXPECTED   ng/mg creat   Noroxycodone                   1723         EXPECTED   ng/mg creat    Sources of oxycodone include scheduled prescription medications.    Oxymorphone and noroxycodone are expected metabolites of    oxycodone. Oxymorphone is also available as a scheduled    prescription  medication. ==================================================================== Test                      Result    Flag   Units      Ref Range   Creatinine              22               mg/dL      >=20 ==================================================================== Declared Medications:  The flagging and interpretation on this report are based on the  following declared medications.  Unexpected results may arise from  inaccuracies in the declared medications.  **Note: The testing scope of this panel includes these medications:  Oxycodone  Temazepam  **Note: The testing scope of this panel does not include following  reported medications:  Acetaminophen (Tylenol)  Allopurinol  Atenolol  Digoxin  Furosemide  Magnesium Oxide  Methocarbamol  Potassium  Topical  Warfarin ==================================================================== For clinical consultation, please call [phone]. ====================================================================    UDS interpretation: Compliant          Medication Assessment Form: Reviewed. Patient indicates being compliant with therapy Treatment compliance: Compliant Risk Assessment Profile: Aberrant behavior: See prior evaluations. None observed or detected today Comorbid factors increasing risk of overdose: See prior notes. No additional risks detected today Risk of substance use disorder (SUD): Low     Opioid Risk Tool - 04/13/17 1319      Family History of Substance Abuse   Alcohol Negative   Illegal Drugs Negative   Rx Drugs Negative  Personal History of Substance Abuse   Alcohol Negative   Illegal Drugs Negative   Rx Drugs Negative     Age   Age between 26-45 years  No     History of Preadolescent Sexual Abuse   History of Preadolescent Sexual Abuse Negative or Male     Psychological Disease   Psychological Disease Negative   Depression Negative     Total Score   Opioid Risk Tool Scoring 0    Opioid Risk Interpretation Low Risk     ORT Scoring interpretation table:  Score <3 = Low Risk for SUD  Score between 4-7 = Moderate Risk for SUD  Score >8 = High Risk for Opioid Abuse   Risk Mitigation Strategies:  Patient Counseling: Covered Patient-Prescriber Agreement (PPA): Present and active  Notification to other healthcare providers: Done  Pharmacologic Plan: No change in therapy, at this time  Laboratory Chemistry  Inflammation Markers (CRP: Acute Phase) (ESR: Chronic Phase) Lab Results  Component Value Date   CRP 0.9 10/09/2015   ESRSEDRATE 9 01/14/2016                 Renal Function Markers Lab Results  Component Value Date   BUN 19 02/27/2017   CREATININE 0.84 02/27/2017   GFRAA 114 02/27/2017   GFRNONAA 99 02/27/2017                 Hepatic Function Markers Lab Results  Component Value Date   AST 21 02/05/2016   ALT 19 02/05/2016   ALBUMIN 2.6 (L) 02/05/2016   ALKPHOS 55 02/05/2016                 Electrolytes Lab Results  Component Value Date   NA 139 02/27/2017   K 4.2 02/27/2017   CL 101 02/27/2017   CALCIUM 7.9 (L) 02/27/2017   MG 2.0 01/16/2017                 Neuropathy Markers Lab Results  Component Value Date   VITAMINB12 371 01/07/2012                 Bone Pathology Markers Lab Results  Component Value Date   ALKPHOS 55 02/05/2016   CALCIUM 7.9 (L) 02/27/2017   TESTOFREE 49.5 03/22/2012   TESTOSTERONE 206.14 (L) 08/18/2012                 Coagulation Parameters Lab Results  Component Value Date   INR 2.5 03/27/2017   LABPROT 33.3 (H) 09/03/2016   APTT 41 (H) 01/24/2015   PLT 127 (L) 09/03/2016                 Cardiovascular Markers Lab Results  Component Value Date   BNP 260.5 (H) 08/27/2016   HGB 10.9 (L) 09/05/2016   HCT 32.0 (L) 09/05/2016                 Note: Lab results reviewed.  Recent Diagnostic Imaging Review  Ct Soft Tissue Neck W Contrast  Result Date: 10/06/2016 CLINICAL DATA:  Left vocal cord  paralysis. Prior left parotid resection. EXAM: CT NECK WITH CONTRAST TECHNIQUE: Multidetector CT imaging of the neck was performed using the standard protocol following the bolus administration of intravenous contrast. CONTRAST:  52m ISOVUE-300 IOPAMIDOL (ISOVUE-300) INJECTION 61% COMPARISON:  08/18/2012 neck CT.  11/21/2014 chest CTA. FINDINGS: Pharynx and larynx: No pharyngeal mass or swelling. There is motion artifact through the larynx which limits assessment. 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 No gross laryngeal mass is identified. Salivary glands: Submandibular glands and right parotid gland are unremarkable. Sequelae of interval left parotidectomy are identified with resection of the lipomatous mass extending inferiorly superficial to the sternocleidomastoid muscle on the prior study. Thyroid: Mild diffuse enlargement, nodularity, and heterogeneity of the thyroid without dominant mass identified. Lymph nodes: There is an enlarged 11 mm short axis left supraclavicular lymph node which has increased in size from 2013 (series 3, image 76, previously 6 mm). Additional subcentimeter left supraclavicular and left thoracic inlet lymph nodes have also mildly enlarged. Right paratracheal lymph nodes near the thoracic inlet have mildly enlarged and measure up to 11 mm in short axis (series 3, image 81). Vascular: Major vascular structures of the neck are patent. Limited intracranial: Unremarkable. Visualized orbits: Not imaged. Mastoids and visualized paranasal sinuses: Unchanged mucous retention cyst or polyp inferiorly in the right maxillary sinus. Minimal left maxillary sinus mucosal thickening. Clear mastoid air cells. Skeleton: Large anterior vertebral osteophytes C4 to T1. Bilateral facet ankylosis at C2-3 and C7-T1. Partial ankylosis across the C2-3  disc space as well. Upper chest: Numerous subcentimeter short axis peritracheal, prevascular, and precarinal lymph nodes are similar to the 2016 chest CTA. Chronic short-segment dissection in the proximal ascending aorta was incompletely imaged and is also partially obscured by motion artifact. Small pleural effusions are partially visualized. Unchanged small left upper lobe lung nodules from 2016, the largest of which measures 3 mm and is calcified. Other: None. IMPRESSION: 1. Mild left supraclavicular and bilateral thoracic inlet lymphadenopathy, increased from 2013 and of indeterminate etiology. 2. Numerous subcentimeter mediastinal lymph nodes, unchanged from 2016. 3. No primary neck mass. 4. Limited assessment of the larynx due to motion. 5. Mildly enlarged and diffusely nodular thyroid likely reflecting goiter. Electronically Signed   By: Logan Bores M.D.   On: 10/06/2016 15:21   Note: Imaging results reviewed.          Meds   Current Meds  Medication Sig  . acetaminophen (TYLENOL) 500 MG tablet Take 500 mg by mouth every 8 (eight) hours as needed for moderate pain or headache.   . allopurinol (ZYLOPRIM) 300 MG tablet Take 300 mg by mouth daily.  Marland Kitchen atenolol (TENORMIN) 25 MG tablet Take 1 tablet (25 mg total) by mouth 2 (two) times daily.  . bacitracin 500 UNIT/GM ointment Apply 1 application topically as needed for wound care.   . bacitracin-neomycin-polymyxin-hydrocortisone (CORTISPORIN) 1 % ointment Apply 1 application topically 2 (two) times daily.  . digoxin (LANOXIN) 0.25 MG tablet Take 1 tablet (0.25 mg total) by mouth daily.  . furosemide (LASIX) 80 MG tablet Take 1 tablet (80 mg total) by mouth 2 (two) times daily.  . magnesium oxide (MAG-OX) 400 (241.3 MG) MG tablet Take 1 tablet (400 mg total) by mouth 2 (two) times daily.  Derrill Memo ON 04/26/2017] methocarbamol (ROBAXIN) 500 MG tablet Take 1 tablet (500 mg total) by mouth 3 (three) times daily.  Derrill Memo ON 04/26/2017] oxyCODONE (OXY  IR/ROXICODONE) 5 MG immediate release tablet Take 1 tablet (5 mg total) by mouth every 8 (eight) hours as needed for severe pain.  . potassium chloride (K-DUR) 10 MEQ tablet Take 2 tablets (20 mEq total) by mouth 2 (two) times daily.  . silver sulfADIAZINE (SILVADENE) 1 % cream Apply 1 application topically daily.  Marland Kitchen  temazepam (RESTORIL) 15 MG capsule Take 15 mg by mouth at bedtime as needed for sleep.  Marland Kitchen warfarin (COUMADIN) 5 MG tablet TAKE 1 TO 1 AND 1/2 TABLETS DAILY AS DIRECTED BY COUMADIN CLINIC  . [DISCONTINUED] methocarbamol (ROBAXIN) 500 MG tablet Take 1 tablet (500 mg total) by mouth 3 (three) times daily.  . [DISCONTINUED] oxyCODONE (OXY IR/ROXICODONE) 5 MG immediate release tablet Take 1 tablet (5 mg total) by mouth every 8 (eight) hours as needed for severe pain.    ROS  Constitutional: Denies any fever or chills Gastrointestinal: No reported hemesis, hematochezia, vomiting, or acute GI distress Musculoskeletal: Denies any acute onset joint swelling, redness, loss of ROM, or weakness Neurological: No reported episodes of acute onset apraxia, aphasia, dysarthria, agnosia, amnesia, paralysis, loss of coordination, or loss of consciousness  Allergies  Mr. Bonser is allergic to penicillins; adhesive [tape]; and bactrim [sulfamethoxazole-trimethoprim].  [place]  Drug: Mr. Reppond  reports that he does not use drugs. Alcohol:  reports that he drinks alcohol. Tobacco:  reports that he has been smoking Cigars.  He has never used smokeless tobacco. Medical:  has a past medical history of Anemia; Anxiety; ARDS (adult respiratory distress syndrome) ([place]) (01/27/2015); Arthritis; Ascending aortic dissection ([place]) (07/14/2008); Asymptomatic chronic venous hypertension (01/15/2010); Atrial fibrillation ([place]); Bell's palsy; CAD (coronary artery disease); Cellulitis (04/02/2014); Cerebral artery occlusion with cerebral infarction ([place]) (10/08/2011); Chronic diastolic heart failure ([place]) (03/01/2015); Chronic  LBP (10/08/2011); CVA (cerebral vascular accident) ([place]) (10/08/2011); DIABETES MELLITUS, TYPE II (11/01/2009); Diverticulosis; 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); Encephalopathy acute (02/05/2015); GERD (gastroesophageal reflux disease); Gout; H/O mechanical aortic valve replacement (11/01/2009); Hiatal hernia; History of colon polyps (12/23/2011); Hyperlipidemia; Hypertension; Hypogonadism male (04/08/2012); Impaired glucose tolerance (10/08/2011); Morbid obesity ([place]) (04/02/2014); Myocardial infarction [facility]) (age 39); OSA (obstructive sleep apnea); Peripheral vascular disease ([place]); 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 (3/47/4259); 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 Surgical: Mr. Lightner  has a past surgical history that includes Laser ablation (Left, 03/06/2010); Cardiac valve replacement; Cholecystectomy (2011); Tonsillectomy (1967); Otoplasty; Orchiectomy (age 89); TEE without cardioversion (N/A, 10/11/2014); Bentall procedure (1988); Coronary artery bypass graft; aortic truck; TEE without cardioversion (N/A, 01/24/2015); Mitral valve replacement (Right, 01/24/2015); Tooth extraction (2016); and Colonoscopy with propofol (N/A, 06/17/2016). Family: family history includes Cancer in his maternal aunt and maternal uncle; Diabetes in his father; Heart disease in his mother; Hypertension in his other; Other in his mother; Stroke in his other; Throat cancer in his mother.  Constitutional Exam  General appearance: Well nourished, well developed, and well hydrated. In no apparent acute distress Vitals:   04/13/17 1307  BP: (!) 142/73  Pulse: 79  Resp: 16  Temp: 97.9 F (36.6 C)  TempSrc: Oral  SpO2: 97%  Weight: 203 lb (92.1 kg)  Height: 5' 1.5" (1.562 m)   Psych/Mental status: Alert, oriented x 3 (person, place,  & time)       Eyes: PERLA Respiratory: No evidence of acute respiratory distress  Cervical Spine Area Exam  Skin & Axial Inspection: No masses, redness, edema, swelling, or associated skin lesions Alignment: Symmetrical Functional ROM: Unrestricted ROM      Stability: No instability detected Muscle Tone/Strength: Functionally intact. No obvious neuro-muscular anomalies detected. Sensory (Neurological): Unimpaired Palpation: No palpable anomalies              Upper Extremity (UE) Exam  Side: Right upper extremity  Side: Left upper extremity  Skin & Extremity Inspection: Skin color, temperature, and hair growth are WNL. No peripheral edema or cyanosis. No masses, redness, swelling, asymmetry, or associated skin lesions. No contractures.  Skin & Extremity Inspection: Skin color, temperature, and hair growth are WNL. No peripheral edema or cyanosis. No masses, redness, swelling, asymmetry, or associated skin lesions. No contractures.  Functional ROM: Unrestricted ROM          Functional ROM: Unrestricted ROM          Muscle Tone/Strength: Functionally intact. No obvious neuro-muscular anomalies detected.  Muscle Tone/Strength: Functionally intact. No obvious neuro-muscular anomalies detected.  Sensory (Neurological): Unimpaired  Sensory (Neurological): Unimpaired  Palpation: No palpable anomalies              Palpation: No palpable anomalies              Specialized Test(s): Deferred         Specialized Test(s): Deferred          Thoracic Spine Area Exam  Skin & Axial Inspection: No masses, redness, or swelling Alignment: Symmetrical Functional ROM: Unrestricted ROM Stability: No instability detected Muscle Tone/Strength: Functionally intact. No obvious neuro-muscular anomalies detected. Sensory (Neurological): Unimpaired Muscle strength & Tone: No palpable anomalies  Lumbar Spine Area Exam  Skin & Axial Inspection: No masses, redness, or swelling Alignment: Symmetrical Functional  ROM: Unrestricted ROM      Stability: No instability detected Muscle Tone/Strength: Functionally intact. No obvious neuro-muscular anomalies detected. Sensory (Neurological): Unimpaired Palpation: No palpable anomalies       Provocative Tests: Lumbar Hyperextension and rotation test: evaluation deferred today       Lumbar Lateral bending test: evaluation deferred today       Patrick's Maneuver: evaluation deferred today                    Gait & Posture Assessment  Ambulation: Unassisted Gait: Relatively normal for age and body habitus Posture: WNL   Lower Extremity Exam    Side: Right lower extremity  Side: Left lower extremity  Skin & Extremity Inspection:  peripheral edema and open skin lesions  Skin & Extremity Inspection: peripheral edema and open skin lesions  Functional ROM: Unrestricted ROM          Functional ROM: Unrestricted ROM          Muscle Tone/Strength: Functionally intact. No obvious neuro-muscular anomalies detected.  Muscle Tone/Strength: Functionally intact. No obvious neuro-muscular anomalies detected.  Sensory (Neurological): Unimpaired  Sensory (Neurological): Unimpaired  Palpation: No palpable anomalies  Palpation: No palpable anomalies   Assessment  Primary Diagnosis & Pertinent Problem List: The primary encounter diagnosis was Spinal stenosis of lumbar region, unspecified whether neurogenic claudication present. Diagnoses of Lumbar facet syndrome, Musculoskeletal pain, and Chronic pain syndrome were also pertinent to this visit.  Status Diagnosis  Controlled Controlled Controlled 1. Spinal stenosis of lumbar region, unspecified whether neurogenic claudication present   2. Lumbar facet syndrome   3. Musculoskeletal pain   4. Chronic pain syndrome     Problems updated and reviewed during this visit: No problems updated. Plan of Care  Pharmacotherapy (Medications Ordered): Meds ordered this encounter  Medications  . methocarbamol (ROBAXIN) 500 MG  tablet    Sig: Take 1 tablet (500 mg total) by mouth 3 (three) times daily.    Dispense:  270 tablet    Refill:  0    Do not place this medication, or any  other prescription from our practice, on "Automatic Refill". Patient may have prescription filled one day early if pharmacy is closed on scheduled refill date.    Order Specific Question:   Supervising Provider    Answer:   Milinda Pointer [phone]  . oxyCODONE (OXY IR/ROXICODONE) 5 MG immediate release tablet    Sig: Take 1 tablet (5 mg total) by mouth every 8 (eight) hours as needed for severe pain.    Dispense:  90 tablet    Refill:  0    Do not place this medication, or any other prescription from our practice, on "Automatic Refill". May Fill one day early if pharmacy is closed on scheduled refill date. Do not fill until: 04/26/2017 To last until:05/26/2017    Order Specific Question:   Supervising Provider    Answer:   Milinda Pointer [phone]  . oxycodone (OXY-IR) 5 MG capsule    Sig: Take 1 capsule (5 mg total) by mouth every 8 (eight) hours as needed.    Dispense:  90 capsule    Refill:  0    Do not place this medication, or any other prescription from our practice, on "Automatic Refill". Patient may have prescription filled one day early if pharmacy is closed on scheduled refill date. Do not fill until:05/26/2017 To last until: 06/25/2017    Order Specific Question:   Supervising Provider    Answer:   Milinda Pointer [phone]  . oxyCODONE (OXY IR/ROXICODONE) 5 MG immediate release tablet    Sig: Take 1 tablet (5 mg total) by mouth every 8 (eight) hours as needed for moderate pain or severe pain.    Dispense:  90 tablet    Refill:  0    Do not place this medication, or any other prescription from our practice, on "Automatic Refill". Patient may have prescription filled one day early if pharmacy is closed on scheduled refill date. Do not fill until: 06/25/2017 To last until:07/25/2017    Order Specific Question:    Supervising Provider    Answer:   Milinda Pointer [phone]   New Prescriptions   No medications on file   Medications administered today: Mr. Quinto had no medications administered during this visit. Lab-work, procedure(s), and/or referral(s): No orders of the defined types were placed in this encounter.  Imaging and/or referral(s): None  Interventional therapies: Planned, scheduled, and/or pending:   Not at this time.   Considering:   Bilateral lumbar facet RFA.    Palliative PRN treatment(s):   Not at this time   Provider-requested follow-up: Return in about 3 months (around 07/14/2017) for MedMgmt.  Future Appointments Date Time Provider [place]  04/24/2017 9:15 AM [facility]  06/05/2017 8:20 AM [name], MD [facility]  07/14/2017 9:00 AM Vevelyn Francois, NP [facility] None   Primary Care Physician: Selinda Orion Location: [facility] Note by: Vevelyn Francois NP Date: 04/13/2017; Time: 2:52 PM  Pain Score Disclaimer: We use the NRS-11 scale. This is a self-reported, subjective measurement of pain severity with only modest accuracy. It is used primarily to identify changes within a particular patient. It must be understood that outpatient pain scales are significantly less accurate that those used for research, where they can be applied under ideal controlled circumstances with minimal exposure to variables. In reality, the score is likely to be a combination of pain intensity and pain affect, where pain affect describes the degree of emotional arousal or changes in action readiness  caused by the sensory experience of pain. Factors such as social and work situation, setting, emotional state, anxiety levels, expectation, and prior pain experience may influence pain perception and show large inter-individual differences that may also be affected by time variables.  Patient  instructions provided during this appointment: Patient Instructions   You have been given a script for 3 oxycodone.  You have Methacarbamal at the pharmcy. ____________________________________________________________________________________________  Medication Rules  Applies to: All patients receiving prescriptions (written or electronic).  Pharmacy of record: Pharmacy where electronic prescriptions will be sent. If written prescriptions are taken to a different pharmacy, please inform the nursing staff. The pharmacy listed in the electronic medical record should be the one where you would like electronic prescriptions to be sent.  Prescription refills: Only during scheduled appointments. Applies to both, written and electronic prescriptions.  NOTE: The following applies primarily to controlled substances (Opioid* Pain Medications).   Patient's responsibilities: 1. Pain Pills: Bring all pain pills to every appointment (except for procedure appointments). 2. Pill Bottles: Bring pills in original pharmacy bottle. Always bring newest bottle. Bring bottle, even if empty. 3. Medication refills: You are responsible for knowing and keeping track of what medications you need refilled. The day before your appointment, write a list of all prescriptions that need to be refilled. Bring that list to your appointment and give it to the admitting nurse. Prescriptions will be written only during appointments. If you forget a medication, it will not be "Called in", "Faxed", or "electronically sent". You will need to get another appointment to get these prescribed. 4. Prescription Accuracy: You are responsible for carefully inspecting your prescriptions before leaving our office. Have the discharge nurse carefully go over each prescription with you, before taking them home. Make sure that your name is accurately spelled, that your address is correct. Check the name and dose of your medication to make sure it is  accurate. Check the number of pills, and the written instructions to make sure they are clear and accurate. Make sure that you are given enough medication to last until your next medication refill appointment. 5. Taking Medication: Take medication as prescribed. Never take more pills than instructed. Never take medication more frequently than prescribed. Taking less pills or less frequently is permitted and encouraged, when it comes to controlled substances (written prescriptions).  6. Inform other Doctors: Always inform, all of your healthcare providers, of all the medications you take. 7. Pain Medication from other Providers: You are not allowed to accept any additional pain medication from any other Doctor or Healthcare provider. There are two exceptions to this rule. (see below) In the event that you require additional pain medication, you are responsible for notifying us, as stated below. 8. Medication Agreement: You are responsible for carefully reading and following our Medication Agreement. This must be signed before receiving any prescriptions from our practice. Safely store a copy of your signed Agreement. Violations to the Agreement will result in no further prescriptions. (Additional copies of our Medication Agreement are available upon request.) 9. Laws, Rules, & Regulations: All patients are expected to follow all Federal and [employer], Rules, [employer]. Ignorance of the Laws does not constitute a valid excuse. The use of any illegal substances is prohibited. 10. Adopted CDC guidelines & recommendations: Target dosing levels will be at or below 60 MME/day. Use of benzodiazepines** is not recommended.  Exceptions: There are only two exceptions to the rule of not receiving pain medications from other Healthcare Providers. 1.  Exception #1 (Emergencies): In the event of an emergency (i.e.: accident requiring emergency care), you are allowed to receive additional pain medication.  However, you are responsible for: As soon as you are able, call our office [phone], at any time of the day or night, and leave a message stating your name, the date and nature of the emergency, and the name and dose of the medication prescribed. In the event that your call is answered by a member of our staff, make sure to document and save the date, time, and the name of the person that took your information.  2. Exception #2 (Planned Surgery): In the event that you are scheduled by another doctor or dentist to have any type of surgery or procedure, you are allowed (for a period no longer than 30 days), to receive additional pain medication, for the acute post-op pain. However, in this case, you are responsible for picking up a copy of our "Post-op Pain Management for Surgeons" handout, and giving it to your surgeon or dentist. This document is available at our office, and does not require an appointment to obtain it. Simply go to our office during business hours (Monday-Thursday from 8:00 AM to 4:00 PM) (Friday 8:00 AM to 12:00 Noon) or if you have a scheduled appointment with [place], prior to your surgery, and ask for it by name. In addition, you will need to provide [place] with your name, name of your surgeon, type of surgery, and date of procedure or surgery.  *Opioid medications include: morphine, codeine, oxycodone, oxymorphone, hydrocodone, hydromorphone, meperidine, tramadol, tapentadol, buprenorphine, fentanyl, methadone. **Benzodiazepine medications include: diazepam (Valium), alprazolam (Xanax), clonazepam (Klonopine), lorazepam (Ativan), clorazepate (Tranxene), chlordiazepoxide (Librium), estazolam (Prosom), oxazepam (Serax), temazepam (Restoril), triazolam (Halcion)  ____________________________________________________________________________________________  BMI Assessment: Estimated body mass index is 37.74 kg/m as calculated from the following:   Height as of this encounter: 5' 1.5" (1.562  m).   Weight as of this encounter: 203 lb (92.1 kg).  BMI interpretation table: BMI level Category Range association with higher incidence of chronic pain  <18 kg/m2 Underweight   18.5-24.9 kg/m2 Ideal body weight   25-29.9 kg/m2 Overweight Increased incidence by 20%  30-34.9 kg/m2 Obese (Class I) Increased incidence by 68%  35-39.9 kg/m2 Severe obesity (Class II) Increased incidence by 136%  >40 kg/m2 Extreme obesity (Class III) Increased incidence by 254%   BMI Readings from Last 4 Encounters:  04/13/17 37.74 kg/m  03/31/17 39.11 kg/m  01/16/17 39.45 kg/m  12/22/16 40.81 kg/m   Wt Readings from Last 4 Encounters:  04/13/17 203 lb (92.1 kg)  03/31/17 207 lb (93.9 kg)  01/16/17 212 lb 3.2 oz (96.3 kg)  12/22/16 216 lb (98 kg)

## 2017-04-13 NOTE — Progress Notes (Signed)
Nursing Pain Medication Assessment:  Safety precautions to be maintained throughout the outpatient stay will include: orient to surroundings, keep bed in low position, maintain call bell within reach at all times, provide assistance with transfer out of bed and ambulation.  Medication Inspection Compliance: Pill count conducted under aseptic conditions, in front of the patient. Neither the pills nor the bottle was removed from the patient's sight at any time. Once count was completed pills were immediately returned to the patient in their original bottle.  Medication: See above Pill/Patch Count: 43 of 90 pills remain Pill/Patch Appearance: Markings consistent with prescribed medication Bottle Appearance: Standard pharmacy container. Clearly labeled. Filled Date: 7 / 27 / 2018 Last Medication intake:  Today

## 2017-04-13 NOTE — Patient Instructions (Addendum)
You have been given a script for 3 oxycodone.  You have Methacarbamal at the pharmcy. ____________________________________________________________________________________________  Medication Rules  Applies to: All patients receiving prescriptions (written or electronic).  Pharmacy of record: Pharmacy where electronic prescriptions will be sent. If written prescriptions are taken to a different pharmacy, please inform the nursing staff. The pharmacy listed in the electronic medical record should be the one where you would like electronic prescriptions to be sent.  Prescription refills: Only during scheduled appointments. Applies to both, written and electronic prescriptions.  NOTE: The following applies primarily to controlled substances (Opioid* Pain Medications).   Patient's responsibilities: 1. Pain Pills: Bring all pain pills to every appointment (except for procedure appointments). 2. Pill Bottles: Bring pills in original pharmacy bottle. Always bring newest bottle. Bring bottle, even if empty. 3. Medication refills: You are responsible for knowing and keeping track of what medications you need refilled. The day before your appointment, write a list of all prescriptions that need to be refilled. Bring that list to your appointment and give it to the admitting nurse. Prescriptions will be written only during appointments. If you forget a medication, it will not be "Called in", "Faxed", or "electronically sent". You will need to get another appointment to get these prescribed. 4. Prescription Accuracy: You are responsible for carefully inspecting your prescriptions before leaving our office. Have the discharge nurse carefully go over each prescription with you, before taking them home. Make sure that your name is accurately spelled, that your address is correct. Check the name and dose of your medication to make sure it is accurate. Check the number of pills, and the written instructions to make  sure they are clear and accurate. Make sure that you are given enough medication to last until your next medication refill appointment. 5. Taking Medication: Take medication as prescribed. Never take more pills than instructed. Never take medication more frequently than prescribed. Taking less pills or less frequently is permitted and encouraged, when it comes to controlled substances (written prescriptions).  6. Inform other Doctors: Always inform, all of your healthcare providers, of all the medications you take. 7. Pain Medication from other Providers: You are not allowed to accept any additional pain medication from any other Doctor or Healthcare provider. There are two exceptions to this rule. (see below) In the event that you require additional pain medication, you are responsible for notifying us, as stated below. 8. Medication Agreement: You are responsible for carefully reading and following our Medication Agreement. This must be signed before receiving any prescriptions from our practice. Safely store a copy of your signed Agreement. Violations to the Agreement will result in no further prescriptions. (Additional copies of our Medication Agreement are available upon request.) 9. Laws, Rules, & Regulations: All patients are expected to follow all Federal and [employer], [employer], Rules, [employer]. Ignorance of the Laws does not constitute a valid excuse. The use of any illegal substances is prohibited. 10. Adopted CDC guidelines & recommendations: Target dosing levels will be at or below 60 MME/day. Use of benzodiazepines** is not recommended.  Exceptions: There are only two exceptions to the rule of not receiving pain medications from other Healthcare Providers. 1. Exception #1 (Emergencies): In the event of an emergency (i.e.: accident requiring emergency care), you are allowed to receive additional pain medication. However, you are responsible for: As soon as you are able, call our office  [phone], at any time of the day or night, and leave a message stating your  name, the date and nature of the emergency, and the name and dose of the medication prescribed. In the event that your call is answered by a member of our staff, make sure to document and save the date, time, and the name of the person that took your information.  2. Exception #2 (Planned Surgery): In the event that you are scheduled by another doctor or dentist to have any type of surgery or procedure, you are allowed (for a period no longer than 30 days), to receive additional pain medication, for the acute post-op pain. However, in this case, you are responsible for picking up a copy of our "Post-op Pain Management for Surgeons" handout, and giving it to your surgeon or dentist. This document is available at our office, and does not require an appointment to obtain it. Simply go to our office during business hours (Monday-Thursday from 8:00 AM to 4:00 PM) (Friday 8:00 AM to 12:00 Noon) or if you have a scheduled appointment with [place], prior to your surgery, and ask for it by name. In addition, you will need to provide [place] with your name, name of your surgeon, type of surgery, and date of procedure or surgery.  *Opioid medications include: morphine, codeine, oxycodone, oxymorphone, hydrocodone, hydromorphone, meperidine, tramadol, tapentadol, buprenorphine, fentanyl, methadone. **Benzodiazepine medications include: diazepam (Valium), alprazolam (Xanax), clonazepam (Klonopine), lorazepam (Ativan), clorazepate (Tranxene), chlordiazepoxide (Librium), estazolam (Prosom), oxazepam (Serax), temazepam (Restoril), triazolam (Halcion)  ____________________________________________________________________________________________  BMI Assessment: Estimated body mass index is 37.74 kg/m as calculated from the following:   Height as of this encounter: 5' 1.5" (1.562 m).   Weight as of this encounter: 203 lb (92.1 kg).  BMI interpretation  table: BMI level Category Range association with higher incidence of chronic pain  <18 kg/m2 Underweight   18.5-24.9 kg/m2 Ideal body weight   25-29.9 kg/m2 Overweight Increased incidence by 20%  30-34.9 kg/m2 Obese (Class I) Increased incidence by 68%  35-39.9 kg/m2 Severe obesity (Class II) Increased incidence by 136%  >40 kg/m2 Extreme obesity (Class III) Increased incidence by 254%   BMI Readings from Last 4 Encounters:  04/13/17 37.74 kg/m  03/31/17 39.11 kg/m  01/16/17 39.45 kg/m  12/22/16 40.81 kg/m   Wt Readings from Last 4 Encounters:  04/13/17 203 lb (92.1 kg)  03/31/17 207 lb (93.9 kg)  01/16/17 212 lb 3.2 oz (96.3 kg)  12/22/16 216 lb (98 kg)

## 2017-04-15 MED FILL — DIGOXIN 250 MCG TABLET: 250 | 30 days supply | Qty: 30 | Fill #4

## 2017-04-21 ENCOUNTER — Other Ambulatory Visit: Payer: Self-pay

## 2017-04-21 MED ORDER — FUROSEMIDE 80 MG PO TABS: 80.0000 mg | ORAL_TABLET | Freq: Two times a day (BID) | ORAL | 10 refills | Status: DC

## 2017-04-24 ENCOUNTER — Other Ambulatory Visit: Payer: Medicare HMO

## 2017-04-24 ENCOUNTER — Ambulatory Visit (INDEPENDENT_AMBULATORY_CARE_PROVIDER_SITE_OTHER): Payer: Medicare HMO | Admitting: *Deleted

## 2017-04-24 DIAGNOSIS — Z5181 Encounter for therapeutic drug level monitoring: Secondary | ICD-10-CM

## 2017-04-24 DIAGNOSIS — Z79899 Other long term (current) drug therapy: Secondary | ICD-10-CM

## 2017-04-24 DIAGNOSIS — I4891 Unspecified atrial fibrillation: Secondary | ICD-10-CM

## 2017-04-24 LAB — BASIC METABOLIC PANEL
BUN / CREAT RATIO: 21 — AB (ref 9–20)
BUN: 17 mg/dL (ref 6–24)
CO2: 28 mmol/L (ref 20–29)
CREATININE: 0.81 mg/dL (ref 0.76–1.27)
Calcium: 8.5 mg/dL — ABNORMAL LOW (ref 8.7–10.2)
Chloride: 100 mmol/L (ref 96–106)
GFR, EST AFRICAN AMERICAN: 116 mL/min/{1.73_m2} (ref >59)
GFR, EST NON AFRICAN AMERICAN: 100 mL/min/{1.73_m2} (ref >59)
Glucose: 94 mg/dL (ref 65–99)
Potassium: 4.1 mmol/L (ref 3.5–5.2)
SODIUM: 139 mmol/L (ref 134–144)

## 2017-04-24 LAB — POCT INR: INR: 2.4

## 2017-04-29 ENCOUNTER — Other Ambulatory Visit: Payer: Self-pay | Admitting: *Deleted

## 2017-04-29 MED ORDER — FUROSEMIDE 80 MG PO TABS: 80.0000 mg | ORAL_TABLET | Freq: Two times a day (BID) | ORAL | 3 refills | Status: DC

## 2017-05-07 ENCOUNTER — Telehealth: Payer: Self-pay | Admitting: Cardiology

## 2017-05-07 ENCOUNTER — Telehealth: Payer: Self-pay | Admitting: *Deleted

## 2017-05-07 NOTE — Telephone Encounter (Signed)
Lance Cook is calling about his furosemide. The pharmacy will not refill the medication for him , he is out them and his legs are starting to swell .  Please call

## 2017-05-07 NOTE — Telephone Encounter (Signed)
Returned the call to the patient. He stated that the pharmacy will not refill his furosemide due to it being too soon for a refill. He would like for the medication prescription to be written differently because he takes anywhere from 40 mg to 100 mg bid as needed for edema. He stated that the provider is aware that he does take sporadic amounts. Will route to the provider for his reccommended.

## 2017-05-07 NOTE — Telephone Encounter (Signed)
Returned the call to the patient. Offered to call in the same prescription for him until we heard from the provider. He stated that it would not work because he would have to pay out of pocket for the furosemide. He stated that he was fine to wait until tomorrow and that it would not be any problem. He was informed that the concern was that on some days he was taking 100 mg bid and may need to come in for lab work. The patient verbalized his understanding.

## 2017-05-07 NOTE — Telephone Encounter (Signed)
OK to fill the prescription so that he does not run out.

## 2017-05-08 ENCOUNTER — Ambulatory Visit (INDEPENDENT_AMBULATORY_CARE_PROVIDER_SITE_OTHER): Payer: Medicare HMO | Admitting: Pharmacist

## 2017-05-08 DIAGNOSIS — Z5181 Encounter for therapeutic drug level monitoring: Secondary | ICD-10-CM | POA: Diagnosis not present

## 2017-05-08 DIAGNOSIS — I4891 Unspecified atrial fibrillation: Secondary | ICD-10-CM | POA: Diagnosis not present

## 2017-05-08 LAB — POCT INR: INR: 5.6

## 2017-05-08 NOTE — Telephone Encounter (Signed)
Please see previous note. The patient was able to get his prescription filled from [facility]

## 2017-05-08 NOTE — Telephone Encounter (Signed)
Spoke with the patient. He was able to get his prescription filled from [facility].

## 2017-05-11 ENCOUNTER — Other Ambulatory Visit: Payer: Self-pay

## 2017-05-18 ENCOUNTER — Telehealth: Payer: Self-pay | Admitting: Pharmacist Clinician (PhC)/ Clinical Pharmacy Specialist

## 2017-05-18 NOTE — Telephone Encounter (Signed)
Pt left VM stating had a question about moving dose (of warfarin?).  Moved his appointment to Tuesday.  Returned call, went to VM.  Advised VM that we could answer his questions in office Tuesday.

## 2017-05-19 ENCOUNTER — Ambulatory Visit (INDEPENDENT_AMBULATORY_CARE_PROVIDER_SITE_OTHER): Payer: Medicare HMO | Admitting: *Deleted

## 2017-05-19 ENCOUNTER — Other Ambulatory Visit: Payer: Medicare HMO

## 2017-05-19 DIAGNOSIS — I4891 Unspecified atrial fibrillation: Secondary | ICD-10-CM

## 2017-05-19 DIAGNOSIS — Z5181 Encounter for therapeutic drug level monitoring: Secondary | ICD-10-CM | POA: Diagnosis not present

## 2017-05-19 LAB — POCT INR: INR: 3.9

## 2017-05-19 MED FILL — DIGOXIN 250 MCG TABLET: 250 | 30 days supply | Qty: 30 | Fill #5

## 2017-05-19 NOTE — Telephone Encounter (Signed)
Coumadin appt rescheduled per patient. Nothing further needed.

## 2017-05-27 ENCOUNTER — Encounter: Payer: Self-pay | Admitting: Cardiology

## 2017-06-02 ENCOUNTER — Telehealth: Payer: Self-pay

## 2017-06-02 NOTE — Telephone Encounter (Signed)
Received a call from [place] at the [address] [facility] and pt has appt tomorrow.   Spoke with pt he states that at his last appt with Dr [name] he told him that he needs standing orders for BMET due to his lasix dosing. I informed that I cannnot order BMET since he just had a stable result 04-2017. I reviewed dr [name]'s note (12-2016) and [place], [place] note(03-2017) and explained to him that these noted do not mention anything about BMET standing orders. He insists that he needs standing orders but will defer to Dr [name] and discuss this with him at his upcoming appt 06-05-17.

## 2017-06-03 ENCOUNTER — Ambulatory Visit (INDEPENDENT_AMBULATORY_CARE_PROVIDER_SITE_OTHER): Payer: Medicare HMO

## 2017-06-03 ENCOUNTER — Other Ambulatory Visit: Payer: Self-pay

## 2017-06-03 DIAGNOSIS — I4891 Unspecified atrial fibrillation: Secondary | ICD-10-CM | POA: Diagnosis not present

## 2017-06-03 DIAGNOSIS — Z5181 Encounter for therapeutic drug level monitoring: Secondary | ICD-10-CM

## 2017-06-03 LAB — PROTIME-INR
INR: 5 — AB (ref 0.8–1.2)
PROTHROMBIN TIME: 53.1 s — AB (ref 9.1–12.0)

## 2017-06-03 LAB — POCT INR: INR: 6.4

## 2017-06-04 NOTE — Telephone Encounter (Signed)
No standing order but we can check a BMET.

## 2017-06-04 NOTE — Progress Notes (Signed)
Follow-up     Cardiology Office Note   Date:  06/06/2017   ID:  Lance Cook, DOB July 31, 1961, MRN [id number]  PCP:  [name], PA-C  Cardiologist:   [name], MD    Chief Complaint  Patient presents with  . Cardiomyopathy      History of Present Illness: Lance Cook is a 56 y.o. male who presents for follow up of anasarca.   He has a past medical history of AVR (s/p Bentall procedure in 1988), severe mitral regurgitation (s/p St Jude mechanical valve placed in 12/2014), CAD (s/p SVG-RCA, known to be occluded by cath in 12/2014), chronic atrial fibrillation, chronic diastolic CHF (EF 40-98% by echo in 12/2015), prior CVA, chronic venous insufficiency, OSA (on CPAP)and recurrent C. difficile infection.  He presented in Dec with dyspnea and anasarca.  He was hospitalized and had 21 liters removed with diuresis.  He weight 222 lbs at discharge.  Since I last saw him he has done well.  He saw [place], PAc and was doing well.    He has since done well.  The patient denies any new symptoms such as chest discomfort, neck or arm discomfort. There has been no new shortness of breath, PND or orthopnea. There have been no reported palpitations, presyncope or syncope.  He has some cramping and at times takes an extra potassium or magnesium.     Past Medical History:  Diagnosis Date  . Anemia   . Anxiety   . ARDS (adult respiratory distress syndrome) ([place]) 01/27/2015  . Arthritis   . Ascending aortic dissection ([place]) 07/14/2008   Localized dissection of ascending aorta noted on CTA in 2009 and stable on CTA in 2011  . Asymptomatic chronic venous hypertension 01/15/2010   Overview:  Overview:  Qualifier: Diagnosis of  By: [name] MD, [name]   Last Assessment & Plan:  I suggested he go to a vein clinic to see if he could be qualified compression hose of some type, since he is not a surgical candidate according to the vascular surgeons.   . Atrial fibrillation ([place])    chronic  persistent  . Bell's palsy   . CAD (coronary artery disease)    Old scar inferior wall myoview, 10/2009 EF 52%.  He did have previous SVG to RCA but no obstructive disease noted on his most recent cath.  SVG occluded.   . Cellulitis 04/02/2014  . Cerebral artery occlusion with cerebral infarction ([place]) 10/08/2011   Overview:  Overview:  And hx of TIA prior to CABG, all thought due to systemic emboli prior to coumadin   . Chronic diastolic heart failure ([place]) 03/01/2015   a. 12/2015: echo showing a preserved EF of 65-70%, moderate AS, and moderate TR.   Marland Kitchen Chronic LBP 10/08/2011  . CVA (cerebral vascular accident) ([place]) 10/08/2011   And hx of TIA prior to CABG, all thought due to systemic emboli prior to coumadin   . DIABETES MELLITUS, TYPE II 11/01/2009   Qualifier: Diagnosis of  By: [name] MD, [name]    . Diverticulosis   . 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 Encephalopathy acute 02/05/2015  . GERD (gastroesophageal reflux disease)    not needing medication at thhis time- 01/22/15  . Gout   . H/O mechanical aortic valve replacement 11/01/2009   Qualifier: Diagnosis of  By: [name], [name]    .  Hiatal hernia   . History of colon polyps 12/23/2011   Overview:  Overview:  Colonoscopy January 2010, 6 mm rectal tubulovillous adenoma. No high-grade dysplasia   . Hyperlipidemia   . Hypertension   . Hypogonadism male 04/08/2012  . Impaired glucose tolerance 10/08/2011  . Morbid obesity ([place]) 04/02/2014  . Myocardial infarction [facility]) age 65  . OSA (obstructive sleep apnea)    CPAP  . Peripheral vascular disease ([place])   . 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 4/74/2595   33 mm St Jude bileaflet mechanical valve placed via right mini thoracotomy approach  . 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    1988 -  Dr Blase Mess at [facility] in [place], [place]  . Severe mitral regurgitation 10/11/2014  . Testicular cancer [facility])    He was 56 y/o. He had surgical resection and rad tx's.   Marland Kitchen TIA (transient ischemic attack)    age 35  . Varicose veins     Past Surgical History:  Procedure Laterality Date  . aortic truck    . BENTALL PROCEDURE  1988   25 mm St Jude mechanical valve conduit - Dr Blase Mess at [facility] in [place], [place]  . CHOLECYSTECTOMY  2011  . COLONOSCOPY WITH PROPOFOL N/A 06/17/2016   Procedure: COLONOSCOPY WITH PROPOFOL;  Surgeon: Jerene Bears, MD;  Location: [facility] ENDOSCOPY;  Service: Gastroenterology;  Laterality: N/A;  . CORONARY ARTERY BYPASS GRAFT    . LASER ABLATION Left 03/06/2010   leg  . MITRAL VALVE REPLACEMENT Right 01/24/2015   Procedure: Re-Operation, MINIMALLY INVASIVE MITRAL VALVE (MV) REPLACEMENT.;  Surgeon: Rexene Alberts, MD;  Location: [place];  Service: Open Heart Surgery;  Laterality: Right;  . ORCHIECTOMY  age 43   testicular cancer  . OTOPLASTY     bilateral, age 59  . TEE WITHOUT CARDIOVERSION N/A 10/11/2014   Procedure: TRANSESOPHAGEAL ECHOCARDIOGRAM (TEE);  Surgeon: Pixie Casino, MD;  Location: [facility] ENDOSCOPY;  Service: Cardiovascular;  Laterality: N/A;  . TEE WITHOUT CARDIOVERSION N/A 01/24/2015   Procedure: TRANSESOPHAGEAL ECHOCARDIOGRAM (TEE);  Surgeon: Rexene Alberts, MD;  Location: [place];  Service: Open Heart Surgery;  Laterality: N/A;  . TONSILLECTOMY  1967  . TOOTH EXTRACTION  2016     Current Outpatient Prescriptions  Medication Sig Dispense Refill  . acetaminophen (TYLENOL) 500 MG tablet Take 500 mg by mouth every 8 (eight) hours as needed for moderate pain or headache.     . allopurinol (ZYLOPRIM) 300 MG tablet Take 300 mg by mouth daily.    Marland Kitchen atenolol (TENORMIN) 25 MG tablet Take 1 tablet (25 mg total) by mouth 2 (two) times daily.    . bacitracin 500 UNIT/GM ointment Apply  1 application topically as needed for wound care.     . bacitracin-neomycin-polymyxin-hydrocortisone (CORTISPORIN) 1 % ointment Apply 1 application topically 2 (two) times daily.    . digoxin (LANOXIN) 0.25 MG tablet Take 1 tablet (0.25 mg total) by mouth daily. 90 tablet 3  . furosemide (LASIX) 80 MG tablet Take 1 tablet (80 mg total) by mouth 2 (two) times daily. 180 tablet 3  . magnesium oxide (MAG-OX) 400 (241.3 MG) MG tablet Take 1 tablet (400 mg total) by mouth 2 (two) times daily. 180 tablet 3  . methocarbamol (ROBAXIN) 500 MG tablet Take 1 tablet (500 mg total) by mouth 3 (three) times daily. 270 tablet  0  . [START ON 06/25/2017] oxyCODONE (OXY IR/ROXICODONE) 5 MG immediate release tablet Take 1 tablet (5 mg total) by mouth every 8 (eight) hours as needed for moderate pain or severe pain. 90 tablet 0  . oxycodone (OXY-IR) 5 MG capsule Take 1 capsule (5 mg total) by mouth every 8 (eight) hours as needed. 90 capsule 0  . oxycodone (OXY-IR) 5 MG capsule Take 5 mg by mouth every 4 (four) hours as needed.    . potassium chloride (K-DUR) 10 MEQ tablet Take 2 tablets (20 mEq total) by mouth 2 (two) times daily. 120 tablet 6  . silver sulfADIAZINE (SILVADENE) 1 % cream Apply 1 application topically daily.    . temazepam (RESTORIL) 15 MG capsule Take 15 mg by mouth at bedtime as needed for sleep.    Marland Kitchen warfarin (COUMADIN) 5 MG tablet TAKE 1 TO 1 AND 1/2 TABLETS DAILY AS DIRECTED BY COUMADIN CLINIC 135 tablet 1   No current facility-administered medications for this visit.     Allergies:   Penicillins; Adhesive [tape]; and Bactrim [sulfamethoxazole-trimethoprim]   ROS:  Please see the history of present illness.   Otherwise, review of systems are positive for cramping.   All other systems are reviewed and negative.    PHYSICAL EXAM: VS:  BP 135/73   Pulse 76   Ht 5' 1.5" (1.562 m)   Wt 201 lb (91.2 kg)   SpO2 95%   BMI 37.36 kg/m  , BMI Body mass index is 37.36 kg/m.  GENERAL:  Well  appearing NECK:  No jugular venous distention, waveform within normal limits, carotid upstroke brisk and symmetric, no bruits, no thyromegaly LUNGS:  Clear to auscultation bilaterally CHEST:  Unremarkable HEART:  PMI not displaced or sustained,S1 within normal limits, mechanical S2 within normal limits, no S3, no clicks, no rubs, apical early systolic murmur, no diastolic murmurs, irregular ABD:  Flat, positive bowel sounds normal in frequency in pitch, no bruits, no rebound, no guarding, no midline pulsatile mass, no hepatomegaly, no splenomegaly EXT:  2 plus pulses throughout, moderate to severe bilateral lower extremity nonpitting edema, no cyanosis no clubbing SKIN:  Multiple excoriations and healing ulcers    EKG:  EKG is not  ordered today.   Recent Labs: 08/27/2016: B Natriuretic Peptide 260.5 09/03/2016: Platelets 127 09/05/2016: Hemoglobin 10.9 01/16/2017: Magnesium 2.0 06/05/2017: BUN 17; Creatinine, Ser 0.92; Potassium 3.8; Sodium 140    Lipid Panel    Component Value Date/Time   CHOL 118 08/18/2012 1137   TRIG 133.0 08/18/2012 1137   HDL 27.20 (L) 08/18/2012 1137   CHOLHDL 4 08/18/2012 1137   VLDL 26.6 08/18/2012 1137   LDLCALC 64 08/18/2012 1137      Wt Readings from Last 3 Encounters:  06/05/17 201 lb (91.2 kg)  04/13/17 203 lb (92.1 kg)  03/31/17 207 lb (93.9 kg)      Other studies Reviewed: Additional studies/ records that were reviewed today include: INR Review of the above records demonstrates:     ASSESSMENT AND PLAN:   MVR:  This has been stable.  No further imaging is indicated at this point.    AVR:  I will follow this with clinical exams and echoes in the future.  HTN:   The blood pressure is at target. No change in medications is indicated. We will continue with therapeutic lifestyle changes (TLC).  EDEMA:  This is improved.  No change in therapy is planned.   ATRIAL FIB:   I will check a PT  today.  He did have an elevated INR but this was  handled by the [facility]madin Clinic.  The patient  tolerates this rhythm and rate control and anticoagulation. We will continue with the meds as listed.  LEG ULCERS:  These have been chronic and I will get him an appt with the Wound Clinic so that he can be established.  He was wondering if he should be on an oral antibiotic.    Current medicines are reviewed at length with the patient today.  The patient does not have concerns regarding medicines.  The following changes have been made:  None  Labs/ tests ordered today include:    Orders Placed This Encounter  Procedures  . Basic Metabolic Panel (BMET)  . AMB referral to wound care center     Disposition:   FU with me in 6 months.   Signed, [name], MD  06/06/2017 8:56 AM    [place]

## 2017-06-05 ENCOUNTER — Encounter: Payer: Self-pay | Admitting: Cardiology

## 2017-06-05 ENCOUNTER — Ambulatory Visit (INDEPENDENT_AMBULATORY_CARE_PROVIDER_SITE_OTHER): Payer: Medicare HMO | Admitting: Cardiology

## 2017-06-05 ENCOUNTER — Ambulatory Visit (INDEPENDENT_AMBULATORY_CARE_PROVIDER_SITE_OTHER): Payer: Medicare HMO | Admitting: Pharmacist

## 2017-06-05 VITALS — BP 135/73 | HR 76 | Ht 61.5 in | Wt 201.0 lb

## 2017-06-05 DIAGNOSIS — I4891 Unspecified atrial fibrillation: Secondary | ICD-10-CM

## 2017-06-05 DIAGNOSIS — I4821 Permanent atrial fibrillation: Secondary | ICD-10-CM

## 2017-06-05 DIAGNOSIS — I890 Lymphedema, not elsewhere classified: Secondary | ICD-10-CM

## 2017-06-05 DIAGNOSIS — Z79899 Other long term (current) drug therapy: Secondary | ICD-10-CM

## 2017-06-05 DIAGNOSIS — L97509 Non-pressure chronic ulcer of other part of unspecified foot with unspecified severity: Secondary | ICD-10-CM

## 2017-06-05 DIAGNOSIS — I482 Chronic atrial fibrillation: Secondary | ICD-10-CM | POA: Diagnosis not present

## 2017-06-05 DIAGNOSIS — Z5181 Encounter for therapeutic drug level monitoring: Secondary | ICD-10-CM

## 2017-06-05 DIAGNOSIS — I10 Essential (primary) hypertension: Secondary | ICD-10-CM

## 2017-06-05 LAB — BASIC METABOLIC PANEL
BUN/Creatinine Ratio: 18 (ref 9–20)
BUN: 17 mg/dL (ref 6–24)
CALCIUM: 8.4 mg/dL — AB (ref 8.7–10.2)
CHLORIDE: 98 mmol/L (ref 96–106)
CO2: 30 mmol/L — ABNORMAL HIGH (ref 20–29)
CREATININE: 0.92 mg/dL (ref 0.76–1.27)
GFR, EST AFRICAN AMERICAN: 108 mL/min/{1.73_m2} (ref >59)
GFR, EST NON AFRICAN AMERICAN: 93 mL/min/{1.73_m2} (ref >59)
Glucose: 99 mg/dL (ref 65–99)
Potassium: 3.8 mmol/L (ref 3.5–5.2)
Sodium: 140 mmol/L (ref 134–144)

## 2017-06-05 LAB — POCT INR: INR: 2.2

## 2017-06-05 NOTE — Patient Instructions (Signed)
Medication Instructions:  Continue current medications  If you need a refill on your cardiac medications before your next appointment, please call your pharmacy.  Labwork: BMP HERE IN OUR OFFICE AT [facility]  Testing/Procedures: None Ordered  Follow-Up:  You have been referred to Wound Clinic  Your physician wants you to follow-up in: 6 Months. You should receive a reminder letter in the mail two months in advance. If you do not receive a letter, please call our office [phone].   Thank you for choosing [facility] at [facility]!!

## 2017-06-05 NOTE — Telephone Encounter (Signed)
Pt has appt today will defer to Dr [name] to discuss at appt today

## 2017-06-06 ENCOUNTER — Encounter: Payer: Self-pay | Admitting: Cardiology

## 2017-06-09 ENCOUNTER — Telehealth: Payer: Self-pay | Admitting: Cardiology

## 2017-06-09 ENCOUNTER — Telehealth: Payer: Self-pay | Admitting: Internal Medicine

## 2017-06-09 NOTE — Telephone Encounter (Signed)
Returned page from patient. Had a severe muscle spasm in his left arm. This happens to him frequently. He treats these episodes with Robaxin and potassium and magnesium supplement. Wanted to check to see if it was ok for him to take an extra potassium and magnesium tablet. Told him this was ok. Will call back if any more issues.  Marcie Mowers, MD Cardiology Fellow, PGY-5

## 2017-06-09 NOTE — Telephone Encounter (Signed)
Lat potassium was 3.8, should pt increase because of cramping?

## 2017-06-09 NOTE — Telephone Encounter (Signed)
New message     Patient calling to find out about increasing potassium. Muscle spasm from shoulder to shoulder and leg . Leg cramps, thigh cramps and bicep spasm.   Pt c/o medication issue:  1. Name of Medication: potassium chloride (K-DUR) 10 MEQ tablet  2. How are you currently taking this medication (dosage and times per day)? Take 2 tablets (20 mEq total) by mouth 2 (two) times daily.  3. Are you having a reaction (difficulty breathing--STAT)? NO  4. What is your medication issue? Patient wants to know if he should increase potassium to relieve cramping

## 2017-06-09 NOTE — Telephone Encounter (Signed)
He has a normal potassium .  I don't know of any reason to be more than normokalemic.  I would not suggest increased potassium.

## 2017-06-10 ENCOUNTER — Ambulatory Visit (INDEPENDENT_AMBULATORY_CARE_PROVIDER_SITE_OTHER): Payer: Medicare HMO

## 2017-06-10 DIAGNOSIS — Z5181 Encounter for therapeutic drug level monitoring: Secondary | ICD-10-CM

## 2017-06-10 DIAGNOSIS — I4891 Unspecified atrial fibrillation: Secondary | ICD-10-CM

## 2017-06-10 LAB — POCT INR: INR: 3.5

## 2017-06-10 NOTE — Telephone Encounter (Signed)
Spoke to patient . Aware no increase of potassium. recommend contacting primary doctor. Patient sttes he is using mustard , an eating  Enrich potassium foods.

## 2017-06-16 MED FILL — TEMAZEPAM 15 MG CAPSULE: 15 | 30 days supply | Qty: 30 | Fill #0

## 2017-06-16 MED FILL — DIGOXIN 250 MCG TABLET: 250 | 30 days supply | Qty: 30 | Fill #6

## 2017-06-24 ENCOUNTER — Ambulatory Visit (INDEPENDENT_AMBULATORY_CARE_PROVIDER_SITE_OTHER): Payer: Medicare HMO | Admitting: Pharmacist

## 2017-06-24 DIAGNOSIS — I4891 Unspecified atrial fibrillation: Secondary | ICD-10-CM | POA: Diagnosis not present

## 2017-06-24 DIAGNOSIS — Z5181 Encounter for therapeutic drug level monitoring: Secondary | ICD-10-CM

## 2017-06-24 LAB — POCT INR: INR: 3

## 2017-07-14 ENCOUNTER — Encounter: Payer: Self-pay | Admitting: Nurse Practitioner

## 2017-07-14 ENCOUNTER — Ambulatory Visit: Payer: Medicare HMO | Attending: Nurse Practitioner | Admitting: Nurse Practitioner

## 2017-07-14 ENCOUNTER — Encounter (HOSPITAL_BASED_OUTPATIENT_CLINIC_OR_DEPARTMENT_OTHER): Payer: Medicare HMO | Attending: Surgery

## 2017-07-14 ENCOUNTER — Other Ambulatory Visit: Payer: Self-pay

## 2017-07-14 VITALS — BP 124/54 | HR 64 | Temp 97.3°F | Resp 18 | Ht 61.0 in | Wt 205.0 lb

## 2017-07-14 DIAGNOSIS — G4733 Obstructive sleep apnea (adult) (pediatric): Secondary | ICD-10-CM | POA: Insufficient documentation

## 2017-07-14 DIAGNOSIS — I252 Old myocardial infarction: Secondary | ICD-10-CM | POA: Insufficient documentation

## 2017-07-14 DIAGNOSIS — M5441 Lumbago with sciatica, right side: Secondary | ICD-10-CM | POA: Diagnosis not present

## 2017-07-14 DIAGNOSIS — I482 Chronic atrial fibrillation: Secondary | ICD-10-CM | POA: Diagnosis not present

## 2017-07-14 DIAGNOSIS — Z952 Presence of prosthetic heart valve: Secondary | ICD-10-CM | POA: Diagnosis not present

## 2017-07-14 DIAGNOSIS — L97829 Non-pressure chronic ulcer of other part of left lower leg with unspecified severity: Secondary | ICD-10-CM | POA: Diagnosis not present

## 2017-07-14 DIAGNOSIS — M7918 Myalgia, other site: Secondary | ICD-10-CM | POA: Diagnosis not present

## 2017-07-14 DIAGNOSIS — D509 Iron deficiency anemia, unspecified: Secondary | ICD-10-CM | POA: Diagnosis not present

## 2017-07-14 DIAGNOSIS — I5033 Acute on chronic diastolic (congestive) heart failure: Secondary | ICD-10-CM | POA: Diagnosis not present

## 2017-07-14 DIAGNOSIS — E119 Type 2 diabetes mellitus without complications: Secondary | ICD-10-CM | POA: Insufficient documentation

## 2017-07-14 DIAGNOSIS — G894 Chronic pain syndrome: Secondary | ICD-10-CM | POA: Diagnosis not present

## 2017-07-14 DIAGNOSIS — Z5181 Encounter for therapeutic drug level monitoring: Secondary | ICD-10-CM | POA: Diagnosis present

## 2017-07-14 DIAGNOSIS — J3801 Paralysis of vocal cords and larynx, unilateral: Secondary | ICD-10-CM | POA: Diagnosis not present

## 2017-07-14 DIAGNOSIS — I872 Venous insufficiency (chronic) (peripheral): Secondary | ICD-10-CM | POA: Insufficient documentation

## 2017-07-14 DIAGNOSIS — I5032 Chronic diastolic (congestive) heart failure: Secondary | ICD-10-CM | POA: Diagnosis not present

## 2017-07-14 DIAGNOSIS — E1151 Type 2 diabetes mellitus with diabetic peripheral angiopathy without gangrene: Secondary | ICD-10-CM | POA: Diagnosis not present

## 2017-07-14 DIAGNOSIS — Z79899 Other long term (current) drug therapy: Secondary | ICD-10-CM | POA: Diagnosis not present

## 2017-07-14 DIAGNOSIS — Z8673 Personal history of transient ischemic attack (TIA), and cerebral infarction without residual deficits: Secondary | ICD-10-CM | POA: Diagnosis not present

## 2017-07-14 DIAGNOSIS — I110 Hypertensive heart disease with heart failure: Secondary | ICD-10-CM | POA: Insufficient documentation

## 2017-07-14 DIAGNOSIS — M545 Low back pain: Secondary | ICD-10-CM | POA: Diagnosis not present

## 2017-07-14 DIAGNOSIS — G8929 Other chronic pain: Secondary | ICD-10-CM | POA: Diagnosis not present

## 2017-07-14 DIAGNOSIS — M109 Gout, unspecified: Secondary | ICD-10-CM | POA: Insufficient documentation

## 2017-07-14 DIAGNOSIS — G4730 Sleep apnea, unspecified: Secondary | ICD-10-CM | POA: Insufficient documentation

## 2017-07-14 DIAGNOSIS — M5442 Lumbago with sciatica, left side: Secondary | ICD-10-CM

## 2017-07-14 DIAGNOSIS — I2510 Atherosclerotic heart disease of native coronary artery without angina pectoris: Secondary | ICD-10-CM | POA: Insufficient documentation

## 2017-07-14 DIAGNOSIS — Z7901 Long term (current) use of anticoagulants: Secondary | ICD-10-CM | POA: Insufficient documentation

## 2017-07-14 DIAGNOSIS — Z79891 Long term (current) use of opiate analgesic: Secondary | ICD-10-CM | POA: Insufficient documentation

## 2017-07-14 DIAGNOSIS — E785 Hyperlipidemia, unspecified: Secondary | ICD-10-CM | POA: Insufficient documentation

## 2017-07-14 DIAGNOSIS — Z880 Allergy status to penicillin: Secondary | ICD-10-CM | POA: Diagnosis not present

## 2017-07-14 MED ORDER — OXYCODONE HCL 5 MG PO TABS: 5.0000 mg | ORAL_TABLET | Freq: Three times a day (TID) | ORAL | 0 refills | Status: DC | PRN

## 2017-07-14 MED ORDER — METHOCARBAMOL 500 MG PO TABS: 500.0000 mg | ORAL_TABLET | Freq: Three times a day (TID) | ORAL | 0 refills | Status: DC

## 2017-07-14 MED FILL — DIGOXIN 250 MCG TABLET: 250 | 30 days supply | Qty: 30 | Fill #7

## 2017-07-14 NOTE — Progress Notes (Signed)
Patient's Name: Lance Cook  MRN: [id number]  Referring Provider: [name], PA-C  DOB: December 04, 1960  PCP: Selinda Orion  DOS: 07/14/2017  Note by: Vevelyn Francois NP  Service setting: Ambulatory outpatient  Specialty: Interventional Pain Management  Location: [facility] (AMB) Pain Management Facility    Patient type: Established    Primary Reason(s) for Visit: Encounter for prescription drug management. (Level of risk: moderate)  CC: Back Pain (low)  HPI  Lance Cook is a 56 y.o. year old, male patient, who comes today for a medication management evaluation. 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; Ascending aortic dissection ([place]); Chronic diastolic congestive heart failure (HCC); 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; Enteritis due to Clostridium difficile; Hypomagnesemia; Physical deconditioning; Long term current use of opiate analgesic; Long term prescription opiate use; Opiate use (22.5 MME/day); Opiate dependence ([place]); Encounter for therapeutic drug level monitoring; Chronic low back pain (midline); Lumbar facet syndrome; Personal history of transient ischemic attack (TIA), and cerebral infarction without residual deficits; ED (erectile dysfunction) of organic origin; Eunuchoidism; Adult BMI 30+; Obstructive apnea; Testicular hypofunction; Lumbar canal stenosis; Malignant neoplasm of testis ([place]);  Chronic lower extremity pain (Left); Personal history of malignant neoplasm of testis; Musculoskeletal pain; History of colonic polyps; Chronic diarrhea; Benign neoplasm of transverse colon; Scrotal swelling; Chronic pain syndrome; Acute on chronic diastolic CHF (congestive heart failure) (HCC); S/P MVR (mitral valve replacement); H/O TIA (transient ischemic attack) and stroke; Heart valve replaced by other means; HTN (hypertension); Low back pain; Lymphadenopathy; Male hypogonadism; Obesity (BMI 30-39.9); Obstructive sleep apnea; Other testicular hypofunction; Presence of other heart-valve replacement; S/P AVR (aortic valve replacement); Spinal stenosis of lumbar region without neurogenic claudication; Spondylosis; Type 2 diabetes mellitus with other specified complication ([place]); Type II or unspecified type diabetes mellitus with other specified manifestations, not stated as uncontrolled; Leg swelling; and Unilateral vocal cord paralysis on their problem list. His primarily concern today is the Back Pain (low)  Pain Assessment: Location: Lower Back Radiating: denies Onset: More than a month ago Duration: Chronic pain Quality: Sharp, Constant Severity: 9 /10 (self-reported pain score)  Note: Reported level is compatible with observation. Clinically the patient looks like a             Information on the proper use of the pain scale provided to the patient today. When using our objective Pain Scale, levels between 6 and 10/10 are said to belong in an emergency room, as it progressively worsens from a 6/10, described as severely limiting, requiring emergency care not usually available at an outpatient pain management facility. At a 6/10 level, communication becomes difficult and requires great effort. Assistance to reach the emergency department may be required. Facial flushing and profuse sweating along with potentially dangerous increases in heart rate and blood pressure will be evident. Effect on ADL:    Timing: Constant Modifying factors: medications, resting  Lance Cook was last scheduled for an appointment on 04/13/2017 for medication management. During today's appointment we reviewed Lance Cook's chronic pain status, as well as his outpatient medication regimen.  He states that his pain is worse. He states he feels like there is a knife in his back. He declined going to the ER for an evaluation. He denies any accident or injury. He admits that he just does not move. He was asking for a non opiate medication. He is trying to get back into the work force. He was introduced to Gabapentin and Cymbalta. He admits that he read Gabapentin could help with his sleep.   The patient  reports that he does not use drugs. His body mass index is 38.73 kg/m.  Further details on both, my assessment(s), as well as the proposed treatment plan, please see below.  Controlled Substance Pharmacotherapy Assessment REMS (Risk Evaluation and Mitigation Strategy)  Analgesic:Oxycodone IR 5m q8hrs (15 mg/day of oxycodone) MME/day:22.5 mg/day TDewayne Shorter RN  07/14/2017  9:28 AM  Signed Nursing Pain Medication Assessment:  Safety precautions to be maintained throughout the outpatient stay will include: orient to surroundings, keep bed in low position, maintain call bell within reach at all times, provide assistance with transfer out of bed and ambulation.  Medication Inspection Compliance: Pill count conducted under aseptic conditions, in front of the patient. Neither the pills nor the bottle was removed from the patient's sight at any time. Once count was completed pills were immediately returned to the patient in their original bottle.  Medication: Oxycodone IR Pill/Patch Count: 44 of 90 pills remain Pill/Patch Appearance: Markings consistent with prescribed medication Bottle Appearance: Standard pharmacy container. Clearly labeled. Filled Date: 10/ 27/ 2018 Last Medication intake:  Today    Pharmacokinetics: Liberation and absorption (onset of action): WNL Distribution (time to peak effect): WNL Metabolism and excretion (duration of action): WNL         Pharmacodynamics: Desired effects: Analgesia: Mr. CPeckhamreports >50% benefit. Functional ability: Patient reports that medication allows him to accomplish basic ADLs Clinically meaningful improvement in function (CMIF): Sustained CMIF goals met Perceived effectiveness: Described as relatively effective, allowing for increase in activities of daily living (ADL) Undesirable effects: Side-effects or Adverse reactions: None reported Monitoring: [place] PMP: Online review of the past 11-montheriod conducted. Compliant with practice rules and regulations Last UDS on record: Summary  Date Value Ref Range Status  12/22/2016 FINAL  Final    Comment:    ==================================================================== TOXASSURE SELECT 13 (MW) ==================================================================== Test                             Result       Flag       Units Drug Present and Declared for Prescription Verification   Oxazepam                       364          EXPECTED   ng/mg creat   Temazepam                      7482         EXPECTED   ng/mg creat    Oxazepam and temazepam are expected metabolites of diazepam.    Oxazepam is also an expected metabolite of other benzodiazepine    drugs, including chlordiazepoxide, prazepam, clorazepate,    halazepam, and temazepam.  Oxazepam and temazepam are available    as scheduled prescription medications.   Oxycodone  677          EXPECTED   ng/mg creat   Oxymorphone                    277          EXPECTED   ng/mg creat   Noroxycodone                   1723         EXPECTED   ng/mg creat    Sources of oxycodone include scheduled prescription medications.    Oxymorphone and noroxycodone are expected metabolites of    oxycodone. Oxymorphone is also  available as a scheduled    prescription medication. ==================================================================== Test                      Result    Flag   Units      Ref Range   Creatinine              22               mg/dL      >=20 ==================================================================== Declared Medications:  The flagging and interpretation on this report are based on the  following declared medications.  Unexpected results may arise from  inaccuracies in the declared medications.  **Note: The testing scope of this panel includes these medications:  Oxycodone  Temazepam  **Note: The testing scope of this panel does not include following  reported medications:  Acetaminophen (Tylenol)  Allopurinol  Atenolol  Digoxin  Furosemide  Magnesium Oxide  Methocarbamol  Potassium  Topical  Warfarin ==================================================================== For clinical consultation, please call [phone]. ====================================================================    UDS interpretation: Compliant          Medication Assessment Form: Reviewed. Patient indicates being compliant with therapy Treatment compliance: Compliant Risk Assessment Profile: Aberrant behavior: See prior evaluations. None observed or detected today Comorbid factors increasing risk of overdose: See prior notes. No additional risks detected today Risk of substance use disorder (SUD): Low Opioid Risk Tool - 07/14/17 0926      Family History of Substance Abuse   Alcohol  Negative    Illegal Drugs  Negative    Rx Drugs  Negative      Personal History of Substance Abuse   Alcohol  Negative    Illegal Drugs  Negative    Rx Drugs  Negative      History of Preadolescent Sexual Abuse   History of Preadolescent Sexual Abuse  Negative or Male      Psychological Disease   Psychological Disease  Negative    Depression  Negative      Total Score   Opioid Risk Tool  Scoring  0    Opioid Risk Interpretation  Low Risk      ORT Scoring interpretation table:  Score <3 = Low Risk for SUD  Score between 4-7 = Moderate Risk for SUD  Score >8 = High Risk for Opioid Abuse   Risk Mitigation Strategies:  Patient Counseling: Covered Patient-Prescriber Agreement (PPA): Present and active  Notification to other healthcare providers: Done  Pharmacologic Plan: No change in therapy, at this time  Laboratory Chemistry  Inflammation Markers (CRP: Acute Phase) (ESR: Chronic Phase) Lab Results  Component Value Date   CRP 0.9 10/09/2015   ESRSEDRATE 9 01/14/2016                 Renal Function Markers Lab Results  Component Value Date   BUN 17 06/05/2017   CREATININE 0.92 06/05/2017   GFRAA 108 06/05/2017   GFRNONAA 93 06/05/2017                 Hepatic Function Markers Lab Results  Component Value Date   AST 21 02/05/2016   ALT 19 02/05/2016   ALBUMIN 2.6 (L) 02/05/2016   ALKPHOS 55 02/05/2016                 Electrolytes Lab Results  Component Value Date   NA 140 06/05/2017   K 3.8 06/05/2017   CL 98 06/05/2017   CALCIUM 8.4 (L) 06/05/2017   MG 2.0 01/16/2017                 Neuropathy Markers Lab Results  Component Value Date   VITAMINB12 371 01/07/2012                 Bone Pathology Markers Lab Results  Component Value Date   ALKPHOS 55 02/05/2016   CALCIUM 8.4 (L) 06/05/2017   TESTOFREE 49.5 03/22/2012   TESTOSTERONE 206.14 (L) 08/18/2012                 Rheumatology Markers Lab Results  Component Value Date   LABURIC 12.1 (H) 11/01/2009                Coagulation Parameters Lab Results  Component Value Date   INR 3.0 06/24/2017   LABPROT 53.1 (H) 06/03/2017   APTT 41 (H) 01/24/2015   PLT 127 (L) 09/03/2016                 Cardiovascular Markers Lab Results  Component Value Date   BNP 260.5 (H) 08/27/2016   CKTOTAL 87 10/28/2009   CKMB 3.1 10/28/2009   TROPONINI 0.02        NO INDICATION OF MYOCARDIAL  INJURY. 10/28/2009   HGB 10.9 (L) 09/05/2016   HCT 32.0 (L) 09/05/2016                 CA Markers No results found for: CEA, CA125, LABCA2               Note: Lab results reviewed.  Recent Diagnostic Imaging Results  CT SOFT TISSUE NECK W CONTRAST CLINICAL DATA:  Left vocal cord paralysis. Prior left parotid resection.  EXAM: CT NECK WITH CONTRAST  TECHNIQUE: Multidetector CT imaging of the neck was performed using the standard protocol following the bolus administration of intravenous contrast.  CONTRAST:  81m ISOVUE-300 IOPAMIDOL (ISOVUE-300) INJECTION 61%  COMPARISON:  08/18/2012 neck CT.  11/21/2014 chest CTA.  FINDINGS: Pharynx and larynx: No pharyngeal mass or swelling. There is motion artifact through the larynx which limits assessment. There is suggestion of asymmetric medial ization of the left vocal cord with some thickening of the left AE folds which would be compatible with the provided history of left vocal cord paralysis. No gross laryngeal mass is identified.  Salivary glands: Submandibular glands and right parotid gland are unremarkable. Sequelae of interval left parotidectomy are identified with resection of the lipomatous mass extending inferiorly superficial to the sternocleidomastoid muscle on the prior study.  Thyroid: Mild diffuse enlargement, nodularity, and heterogeneity of the thyroid without dominant mass identified.  Lymph nodes: There is an enlarged 11 mm short axis left supraclavicular lymph node which has increased in size from 2013 (series 3, image 76, previously 6 mm). Additional subcentimeter left supraclavicular and left thoracic inlet lymph nodes  have also mildly enlarged. Right paratracheal lymph nodes near the thoracic inlet have mildly enlarged and measure up to 11 mm in short axis (series 3, image 81).  Vascular: Major vascular structures of the neck are patent.  Limited intracranial: Unremarkable.  Visualized orbits: Not  imaged.  Mastoids and visualized paranasal sinuses: Unchanged mucous retention cyst or polyp inferiorly in the right maxillary sinus. Minimal left maxillary sinus mucosal thickening. Clear mastoid air cells.  Skeleton: Large anterior vertebral osteophytes C4 to T1. Bilateral facet ankylosis at C2-3 and C7-T1. Partial ankylosis across the C2-3 disc space as well.  Upper chest: Numerous subcentimeter short axis peritracheal, prevascular, and precarinal lymph nodes are similar to the 2016 chest CTA. Chronic short-segment dissection in the proximal ascending aorta was incompletely imaged and is also partially obscured by motion artifact. Small pleural effusions are partially visualized. Unchanged small left upper lobe lung nodules from 2016, the largest of which measures 3 mm and is calcified.  Other: None.  IMPRESSION: 1. Mild left supraclavicular and bilateral thoracic inlet lymphadenopathy, increased from 2013 and of indeterminate etiology. 2. Numerous subcentimeter mediastinal lymph nodes, unchanged from 2016. 3. No primary neck mass. 4. Limited assessment of the larynx due to motion. 5. Mildly enlarged and diffusely nodular thyroid likely reflecting goiter.  Electronically Signed   By: Logan Bores M.D.   On: 10/06/2016 15:21  Complexity Note: Imaging results reviewed. Results shared with Mr. Herda, using Layman's terms.                         Meds   Current Outpatient Medications:  .  acetaminophen (TYLENOL) 500 MG tablet, Take 500 mg by mouth every 8 (eight) hours as needed for moderate pain or headache. , Disp: , Rfl:  .  allopurinol (ZYLOPRIM) 300 MG tablet, Take 300 mg by mouth daily., Disp: , Rfl:  .  atenolol (TENORMIN) 25 MG tablet, Take 1 tablet (25 mg total) by mouth 2 (two) times daily., Disp: , Rfl:  .  bacitracin 500 UNIT/GM ointment, Apply 1 application topically as needed for wound care. , Disp: , Rfl:  .  digoxin (LANOXIN) 0.25 MG tablet, Take 1 tablet  (0.25 mg total) by mouth daily., Disp: 90 tablet, Rfl: 3 .  furosemide (LASIX) 80 MG tablet, Take 1 tablet (80 mg total) by mouth 2 (two) times daily., Disp: 180 tablet, Rfl: 3 .  magnesium oxide (MAG-OX) 400 (241.3 MG) MG tablet, Take 1 tablet (400 mg total) by mouth 2 (two) times daily., Disp: 180 tablet, Rfl: 3 .  [START ON 07/25/2017] methocarbamol (ROBAXIN) 500 MG tablet, Take 1 tablet (500 mg total) 3 (three) times daily by mouth., Disp: 270 tablet, Rfl: 0 .  [START ON 09/23/2017] oxyCODONE (OXY IR/ROXICODONE) 5 MG immediate release tablet, Take 1 tablet (5 mg total) every 8 (eight) hours as needed by mouth for moderate pain or severe pain., Disp: 90 tablet, Rfl: 0 .  potassium chloride (K-DUR) 10 MEQ tablet, Take 2 tablets (20 mEq total) by mouth 2 (two) times daily., Disp: 120 tablet, Rfl: 6 .  silver sulfADIAZINE (SILVADENE) 1 % cream, Apply 1 application topically daily., Disp: , Rfl:  .  temazepam (RESTORIL) 15 MG capsule, Take 15 mg by mouth at bedtime as needed for sleep., Disp: , Rfl:  .  warfarin (COUMADIN) 5 MG tablet, TAKE 1 TO 1 AND 1/2 TABLETS DAILY AS DIRECTED BY COUMADIN CLINIC, Disp: 135 tablet, Rfl: 1 .  [START ON 07/25/2017] oxyCODONE (  OXY IR/ROXICODONE) 5 MG immediate release tablet, Take 1 tablet (5 mg total) every 8 (eight) hours as needed by mouth for severe pain., Disp: 90 tablet, Rfl: 0 .  [START ON 08/24/2017] oxyCODONE (OXY IR/ROXICODONE) 5 MG immediate release tablet, Take 1 tablet (5 mg total) every 8 (eight) hours as needed by mouth for severe pain., Disp: 90 tablet, Rfl: 0  ROS  Constitutional: Denies any fever or chills Gastrointestinal: No reported hemesis, hematochezia, vomiting, or acute GI distress Musculoskeletal: Denies any acute onset joint swelling, redness, loss of ROM, or weakness Neurological: No reported episodes of acute onset apraxia, aphasia, dysarthria, agnosia, amnesia, paralysis, loss of coordination, or loss of consciousness  Allergies  Mr.  Neels is allergic to penicillins; adhesive [tape]; and bactrim [sulfamethoxazole-trimethoprim].  [place]  Drug: Mr. Cubero  reports that he does not use drugs. Alcohol:  reports that he drinks alcohol. Tobacco:  reports that he has been smoking cigars.  he has never used smokeless tobacco. Medical:  has a past medical history of Anemia, Anxiety, ARDS (adult respiratory distress syndrome) ([place]) (01/27/2015), Arthritis, Ascending aortic dissection (HCC) (07/14/2008), Asymptomatic chronic venous hypertension (01/15/2010), Atrial fibrillation (HCC), Bell's palsy, CAD (coronary artery disease), Cellulitis (04/02/2014), Cerebral artery occlusion with cerebral infarction ([place]) (10/08/2011), Chronic diastolic heart failure ([place]) (03/01/2015), Chronic LBP (10/08/2011), CVA (cerebral vascular accident) ([place]) (10/08/2011), DIABETES MELLITUS, TYPE II (11/01/2009), Diverticulosis, 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), Encephalopathy acute (02/05/2015), GERD (gastroesophageal reflux disease), Gout, H/O mechanical aortic valve replacement (11/01/2009), Hiatal hernia, History of colon polyps (12/23/2011), Hyperlipidemia, Hypertension, Hypogonadism male (04/08/2012), Impaired glucose tolerance (10/08/2011), Morbid obesity ([place]) (04/02/2014), Myocardial infarction [facility]) (age 84), OSA (obstructive sleep apnea), Peripheral vascular disease ([place]), 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 Surgical: Mr. Sardina  has a past surgical history that includes Laser ablation (Left, 03/06/2010); Cardiac valve replacement; Cholecystectomy (2011); Tonsillectomy (1967); Otoplasty; Orchiectomy (age 77); Bentall procedure (1988); Coronary artery bypass graft; aortic truck; Tooth extraction (2016); COLONOSCOPY WITH PROPOFOL  (N/A, 06/17/2016); TRANSESOPHAGEAL ECHOCARDIOGRAM (TEE) (N/A, 01/24/2015); Re-Operation, MINIMALLY INVASIVE MITRAL VALVE (MV) REPLACEMENT. (Right, 01/24/2015); RIGHT HEART CATH AND CORONARY ANGIOGRAPHY (12/18/2014); and TRANSESOPHAGEAL ECHOCARDIOGRAM (TEE) (N/A, 10/11/2014). Family: family history includes Cancer in his maternal aunt and maternal uncle; Diabetes in his father; Heart disease in his mother; Hypertension in his other; Other in his mother; Stroke in his other; Throat cancer in his mother.  Constitutional Exam  General appearance: Well nourished, well developed, and well hydrated. In no apparent acute distress Vitals:   07/14/17 0921  BP: (!) 124/54  Pulse: 64  Resp: 18  Temp: (!) 97.3 F (36.3 C)  SpO2: 100%  Weight: 205 lb (93 kg)  Height: _0  (1.549 m)   BMI Assessment: Estimated body mass index is 38.73 kg/m as calculated from the following:   Height as of this encounter: _1  (1.549 m).   Weight as of this encounter: 205 lb (93 kg). Psych/Mental status: Alert, oriented x 3 (person, place, & time)       Eyes: PERLA Respiratory: No evidence of acute respiratory distress  Cervical Spine Area Exam  Skin & Axial Inspection: No masses, redness, edema, swelling, or associated skin lesions Alignment: Symmetrical Functional ROM: Unrestricted ROM      Stability: No instability detected Muscle Tone/Strength: Functionally intact. No obvious neuro-muscular anomalies detected. Sensory (Neurological): Unimpaired Palpation:  No palpable anomalies              Upper Extremity (UE) Exam    Side: Right upper extremity  Side: Left upper extremity  Skin & Extremity Inspection: Skin color, temperature, and hair growth are WNL. No peripheral edema or cyanosis. No masses, redness, swelling, asymmetry, or associated skin lesions. No contractures.  Skin & Extremity Inspection: Skin color, temperature, and hair growth are WNL. No peripheral edema or cyanosis. No masses, redness, swelling,  asymmetry, or associated skin lesions. No contractures.  Functional ROM: Unrestricted ROM          Functional ROM: Unrestricted ROM          Muscle Tone/Strength: Functionally intact. No obvious neuro-muscular anomalies detected.  Muscle Tone/Strength: Functionally intact. No obvious neuro-muscular anomalies detected.  Sensory (Neurological): Unimpaired          Sensory (Neurological): Unimpaired          Palpation: No palpable anomalies              Palpation: No palpable anomalies              Specialized Test(s): Deferred         Specialized Test(s): Deferred          Thoracic Spine Area Exam  Skin & Axial Inspection: No masses, redness, or swelling Alignment: Symmetrical Functional ROM: Unrestricted ROM Stability: No instability detected Muscle Tone/Strength: Functionally intact. No obvious neuro-muscular anomalies detected. Sensory (Neurological): Unimpaired Muscle strength & Tone: No palpable anomalies  Lumbar Spine Area Exam  Skin & Axial Inspection: No masses, redness, or swelling Alignment: Symmetrical Functional ROM: Unrestricted ROM      Stability: No instability detected Muscle Tone/Strength: Functionally intact. No obvious neuro-muscular anomalies detected. Sensory (Neurological): Unimpaired Palpation: No palpable anomalies       Provocative Tests: Lumbar Hyperextension and rotation test: evaluation deferred today       Lumbar Lateral bending test: evaluation deferred today       Patrick's Maneuver: evaluation deferred today                    Gait & Posture Assessment  Ambulation: Unassisted Gait: Relatively normal for age and body habitus Posture: WNL   Lower Extremity Exam    Side: Right lower extremity  Side: Left lower extremity  Skin & Extremity Inspection: Skin color, temperature, and hair growth are WNL. No peripheral edema or cyanosis. No masses, redness, swelling, asymmetry, or associated skin lesions. No contractures.  Skin & Extremity Inspection: Skin  color, temperature, and hair growth are WNL. No peripheral edema or cyanosis. No masses, redness, swelling, asymmetry, or associated skin lesions. No contractures.  Functional ROM: Unrestricted ROM          Functional ROM: Unrestricted ROM          Muscle Tone/Strength: Functionally intact. No obvious neuro-muscular anomalies detected.  Muscle Tone/Strength: Functionally intact. No obvious neuro-muscular anomalies detected.  Sensory (Neurological): Unimpaired  Sensory (Neurological): Unimpaired  Palpation: No palpable anomalies  Palpation: No palpable anomalies   Assessment  Primary Diagnosis & Pertinent Problem List: The primary encounter diagnosis was Chronic pain syndrome. Diagnoses of Musculoskeletal pain, Long term prescription opiate use, and Chronic low back pain (midline) were also pertinent to this visit.  Status Diagnosis  Controlled Controlled Controlled 1. Chronic pain syndrome   2. Musculoskeletal pain   3. Long term prescription opiate use   4. Chronic low back pain (midline)     Problems  updated and reviewed during this visit: Problem  Chronic Pain Syndrome  Chronic low back pain (midline)  Lumbar facet syndrome  Chronic lower extremity pain (Left)  Lumbar Canal Stenosis   Overview:  Overview:  Jan 2012 MRI with mod to severe l4-5, facet DJD    Plan of Care  Pharmacotherapy (Medications Ordered): Meds ordered this encounter  Medications  . oxyCODONE (OXY IR/ROXICODONE) 5 MG immediate release tablet    Sig: Take 1 tablet (5 mg total) every 8 (eight) hours as needed by mouth for moderate pain or severe pain.    Dispense:  90 tablet    Refill:  0    Do not place this medication, or any other prescription from our practice, on "Automatic Refill". Patient may have prescription filled one day early if pharmacy is closed on scheduled refill date. Do not fill until: 09/23/2017 To last until:10/23/2017  . methocarbamol (ROBAXIN) 500 MG tablet    Sig: Take 1 tablet (500  mg total) 3 (three) times daily by mouth.    Dispense:  270 tablet    Refill:  0    Do not place this medication, or any other prescription from our practice, on "Automatic Refill". Patient may have prescription filled one day early if pharmacy is closed on scheduled refill date.  Marland Kitchen oxyCODONE (OXY IR/ROXICODONE) 5 MG immediate release tablet    Sig: Take 1 tablet (5 mg total) every 8 (eight) hours as needed by mouth for severe pain.    Dispense:  90 tablet    Refill:  0    Do not place this medication, or any other prescription from our practice, on "Automatic Refill". Patient may have prescription filled one day early if pharmacy is closed on scheduled refill date. Do not fill until: 07/25/2017 To last until:08/24/2017  . oxyCODONE (OXY IR/ROXICODONE) 5 MG immediate release tablet    Sig: Take 1 tablet (5 mg total) every 8 (eight) hours as needed by mouth for severe pain.    Dispense:  90 tablet    Refill:  0    Do not place this medication, or any other prescription from our practice, on "Automatic Refill". Patient may have prescription filled one day early if pharmacy is closed on scheduled refill date. Do not fill until: 08/24/2017 To last until:09/23/2017    Order Specific Question:   Supervising Provider    Answer:   Milinda Pointer [phone]  This SmartLink is deprecated. Use AVSMEDLIST instead to display the medication list for a patient. Medications administered today: Chidi Shirer had no medications administered during this visit. Lab-work, procedure(s), and/or referral(s): Orders Placed This Encounter  Procedures  . ToxASSURE Select 13 (MW), Urine   Imaging and/or referral(s): None  Interventional therapies: Planned, scheduled, and/or pending:  Not at this time. Declined Gabapentin secondary to not wanting to return for evaluation if not effective. (copay per patient)   Considering:  Bilateral lumbar facet RFA.    Palliative PRN treatment(s):  Not at this time    Provider-requested follow-up: Return in about 3 months (around 10/14/2017) for MedMgmt.  Future Appointments  Date Time Provider [place]  07/14/2017  1:45 PM [facility]  07/22/2017 11:00 AM [facility]   Primary Care Physician: Selinda Orion Location: [facility] Note by: Vevelyn Francois NP Date: 07/14/2017; Time: 9:54 AM  Pain Score Disclaimer: We use the NRS-11 scale. This is a self-reported, subjective measurement of pain severity with only  modest accuracy. It is used primarily to identify changes within a particular patient. It must be understood that outpatient pain scales are significantly less accurate that those used for research, where they can be applied under ideal controlled circumstances with minimal exposure to variables. In reality, the score is likely to be a combination of pain intensity and pain affect, where pain affect describes the degree of emotional arousal or changes in action readiness caused by the sensory experience of pain. Factors such as social and work situation, setting, emotional state, anxiety levels, expectation, and prior pain experience may influence pain perception and show large inter-individual differences that may also be affected by time variables.  Patient instructions provided during this appointment: Patient Instructions    ____________________________________________________________________________________________  Medication Rules  Applies to: All patients receiving prescriptions (written or electronic).  Pharmacy of record: Pharmacy where electronic prescriptions will be sent. If written prescriptions are taken to a different pharmacy, please inform the nursing staff. The pharmacy listed in the electronic medical record should be the one where you would like electronic prescriptions to be sent.  Prescription refills: Only  during scheduled appointments. Applies to both, written and electronic prescriptions.  NOTE: The following applies primarily to controlled substances (Opioid* Pain Medications).   Patient's responsibilities: 1. Pain Pills: Bring all pain pills to every appointment (except for procedure appointments). 2. Pill Bottles: Bring pills in original pharmacy bottle. Always bring newest bottle. Bring bottle, even if empty. 3. Medication refills: You are responsible for knowing and keeping track of what medications you need refilled. The day before your appointment, write a list of all prescriptions that need to be refilled. Bring that list to your appointment and give it to the admitting nurse. Prescriptions will be written only during appointments. If you forget a medication, it will not be "Called in", "Faxed", or "electronically sent". You will need to get another appointment to get these prescribed. 4. Prescription Accuracy: You are responsible for carefully inspecting your prescriptions before leaving our office. Have the discharge nurse carefully go over each prescription with you, before taking them home. Make sure that your name is accurately spelled, that your address is correct. Check the name and dose of your medication to make sure it is accurate. Check the number of pills, and the written instructions to make sure they are clear and accurate. Make sure that you are given enough medication to last until your next medication refill appointment. 5. Taking Medication: Take medication as prescribed. Never take more pills than instructed. Never take medication more frequently than prescribed. Taking less pills or less frequently is permitted and encouraged, when it comes to controlled substances (written prescriptions).  6. Inform other Doctors: Always inform, all of your healthcare providers, of all the medications you take. 7. Pain Medication from other Providers: You are not allowed to accept any  additional pain medication from any other Doctor or Healthcare provider. There are two exceptions to this rule. (see below) In the event that you require additional pain medication, you are responsible for notifying us, as stated below. 8. Medication Agreement: You are responsible for carefully reading and following our Medication Agreement. This must be signed before receiving any prescriptions from our practice. Safely store a copy of your signed Agreement. Violations to the Agreement will result in no further prescriptions. (Additional copies of our Medication Agreement are available upon request.) 9. Laws, Rules, & Regulations: All patients are expected to follow all Federal and [employer], Rules, [employer]. Ignorance  of the Laws does not constitute a valid excuse. The use of any illegal substances is prohibited. 10. Adopted CDC guidelines & recommendations: Target dosing levels will be at or below 60 MME/day. Use of benzodiazepines** is not recommended.  Exceptions: There are only two exceptions to the rule of not receiving pain medications from other Healthcare Providers. 1. Exception #1 (Emergencies): In the event of an emergency (i.e.: accident requiring emergency care), you are allowed to receive additional pain medication. However, you are responsible for: As soon as you are able, call our office [phone], at any time of the day or night, and leave a message stating your name, the date and nature of the emergency, and the name and dose of the medication prescribed. In the event that your call is answered by a member of our staff, make sure to document and save the date, time, and the name of the person that took your information.  2. Exception #2 (Planned Surgery): In the event that you are scheduled by another doctor or dentist to have any type of surgery or procedure, you are allowed (for a period no longer than 30 days), to receive additional pain medication, for the acute  post-op pain. However, in this case, you are responsible for picking up a copy of our "Post-op Pain Management for Surgeons" handout, and giving it to your surgeon or dentist. This document is available at our office, and does not require an appointment to obtain it. Simply go to our office during business hours (Monday-Thursday from 8:00 AM to 4:00 PM) (Friday 8:00 AM to 12:00 Noon) or if you have a scheduled appointment with [place], prior to your surgery, and ask for it by name. In addition, you will need to provide [place] with your name, name of your surgeon, type of surgery, and date of procedure or surgery.  *Opioid medications include: morphine, codeine, oxycodone, oxymorphone, hydrocodone, hydromorphone, meperidine, tramadol, tapentadol, buprenorphine, fentanyl, methadone. **Benzodiazepine medications include: diazepam (Valium), alprazolam (Xanax), clonazepam (Klonopine), lorazepam (Ativan), clorazepate (Tranxene), chlordiazepoxide (Librium), estazolam (Prosom), oxazepam (Serax), temazepam (Restoril), triazolam (Halcion)  ____________________________________________________________________________________________  ____________________________________________________________________________________________  Pain Scale  Introduction: The pain score used by this practice is the Verbal Numerical Rating Scale (VNRS-11). This is an 11-point scale. It is for adults and children 10 years or older. There are significant differences in how the pain score is reported, used, and applied. Forget everything you learned in the past and learn this scoring system.  General Information: The scale should reflect your current level of pain. Unless you are specifically asked for the level of your worst pain, or your average pain. If you are asked for one of these two, then it should be understood that it is over the past 24 hours.  Basic Activities of Daily Living (ADL): Personal hygiene, dressing, eating, transferring,  and using restroom.  Instructions: Most patients tend to report their level of pain as a combination of two factors, their physical pain and their psychosocial pain. This last one is also known as "suffering" and it is reflection of how physical pain affects you socially and psychologically. From now on, report them separately. From this point on, when asked to report your pain level, report only your physical pain. Use the following table for reference.  Pain Clinic Pain Levels (0-5/10)  Pain Level Score  Description  No Pain 0   Mild pain 1 Nagging, annoying, but does not interfere with basic activities of daily living (ADL). Patients are able to eat, bathe, get  dressed, toileting (being able to get on and off the toilet and perform personal hygiene functions), transfer (move in and out of bed or a chair without assistance), and maintain continence (able to control bladder and bowel functions). Blood pressure and heart rate are unaffected. A normal heart rate for a healthy adult ranges from 60 to 100 bpm (beats per minute).   Mild to moderate pain 2 Noticeable and distracting. Impossible to hide from other people. More frequent flare-ups. Still possible to adapt and function close to normal. It can be very annoying and may have occasional stronger flare-ups. With discipline, patients may get used to it and adapt.   Moderate pain 3 Interferes significantly with activities of daily living (ADL). It becomes difficult to feed, bathe, get dressed, get on and off the toilet or to perform personal hygiene functions. Difficult to get in and out of bed or a chair without assistance. Very distracting. With effort, it can be ignored when deeply involved in activities.   Moderately severe pain 4 Impossible to ignore for more than a few minutes. With effort, patients may still be able to manage work or participate in some social activities. Very difficult to concentrate. Signs of autonomic nervous system  discharge are evident: dilated pupils (mydriasis); mild sweating (diaphoresis); sleep interference. Heart rate becomes elevated (>115 bpm). Diastolic blood pressure (lower number) rises above 100 mmHg. Patients find relief in laying down and not moving.   Severe pain 5 Intense and extremely unpleasant. Associated with frowning face and frequent crying. Pain overwhelms the senses.  Ability to do any activity or maintain social relationships becomes significantly limited. Conversation becomes difficult. Pacing back and forth is common, as getting into a comfortable position is nearly impossible. Pain wakes you up from deep sleep. Physical signs will be obvious: pupillary dilation; increased sweating; goosebumps; brisk reflexes; cold, clammy hands and feet; nausea, vomiting or dry heaves; loss of appetite; significant sleep disturbance with inability to fall asleep or to remain asleep. When persistent, significant weight loss is observed due to the complete loss of appetite and sleep deprivation.  Blood pressure and heart rate becomes significantly elevated. Caution: If elevated blood pressure triggers a pounding headache associated with blurred vision, then the patient should immediately seek attention at an urgent or emergency care unit, as these may be signs of an impending stroke.    Emergency Department Pain Levels (6-10/10)  Emergency Room Pain 6 Severely limiting. Requires emergency care and should not be seen or managed at an outpatient pain management facility. Communication becomes difficult and requires great effort. Assistance to reach the emergency department may be required. Facial flushing and profuse sweating along with potentially dangerous increases in heart rate and blood pressure will be evident.   Distressing pain 7 Self-care is very difficult. Assistance is required to transport, or use restroom. Assistance to reach the emergency department will be required. Tasks requiring coordination,  such as bathing and getting dressed become very difficult.   Disabling pain 8 Self-care is no longer possible. At this level, pain is disabling. The individual is unable to do even the most "basic" activities such as walking, eating, bathing, dressing, transferring to a bed, or toileting. Fine motor skills are lost. It is difficult to think clearly.   Incapacitating pain 9 Pain becomes incapacitating. Thought processing is no longer possible. Difficult to remember your own name. Control of movement and coordination are lost.   The worst pain imaginable 10 At this level, most patients pass out from  pain. When this level is reached, collapse of the autonomic nervous system occurs, leading to a sudden drop in blood pressure and heart rate. This in turn results in a temporary and dramatic drop in blood flow to the brain, leading to a loss of consciousness. Fainting is one of the body's self defense mechanisms. Passing out puts the brain in a calmed state and causes it to shut down for a while, in order to begin the healing process.    Summary: 1. Refer to this scale when providing [place] with your pain level. 2. Be accurate and careful when reporting your pain level. This will help with your care. 3. Over-reporting your pain level will lead to loss of credibility. 4. Even a level of 1/10 means that there is pain and will be treated at our facility. 5. High, inaccurate reporting will be documented as "Symptom Exaggeration", leading to loss of credibility and suspicions of possible secondary gains such as obtaining more narcotics, or wanting to appear disabled, for fraudulent reasons. 6. Only pain levels of 5 or below will be seen at our facility. 7. Pain levels of 6 and above will be sent to the Emergency Department and the appointment cancelled. ____________________________________________________________________________________________   BMI Assessment: Estimated body mass index is 38.73 kg/m as  calculated from the following:   Height as of this encounter: _0  (1.549 m).   Weight as of this encounter: 205 lb (93 kg).  BMI interpretation table: BMI level Category Range association with higher incidence of chronic pain  <18 kg/m2 Underweight   18.5-24.9 kg/m2 Ideal body weight   25-29.9 kg/m2 Overweight Increased incidence by 20%  30-34.9 kg/m2 Obese (Class I) Increased incidence by 68%  35-39.9 kg/m2 Severe obesity (Class II) Increased incidence by 136%  >40 kg/m2 Extreme obesity (Class III) Increased incidence by 254%   BMI Readings from Last 4 Encounters:  07/14/17 38.73 kg/m  06/05/17 37.36 kg/m  04/13/17 37.74 kg/m  03/31/17 39.11 kg/m   Wt Readings from Last 4 Encounters:  07/14/17 205 lb (93 kg)  06/05/17 201 lb (91.2 kg)  04/13/17 203 lb (92.1 kg)  03/31/17 207 lb (93.9 kg)

## 2017-07-14 NOTE — Progress Notes (Signed)
Nursing Pain Medication Assessment:  Safety precautions to be maintained throughout the outpatient stay will include: orient to surroundings, keep bed in low position, maintain call bell within reach at all times, provide assistance with transfer out of bed and ambulation.  Medication Inspection Compliance: Pill count conducted under aseptic conditions, in front of the patient. Neither the pills nor the bottle was removed from the patient's sight at any time. Once count was completed pills were immediately returned to the patient in their original bottle.  Medication: Oxycodone IR Pill/Patch Count: 44 of 90 pills remain Pill/Patch Appearance: Markings consistent with prescribed medication Bottle Appearance: Standard pharmacy container. Clearly labeled. Filled Date: 10/ 27/ 2018 Last Medication intake:  Today

## 2017-07-14 NOTE — Patient Instructions (Addendum)
____________________________________________________________________________________________  Medication Rules  Applies to: All patients receiving prescriptions (written or electronic).  Pharmacy of record: Pharmacy where electronic prescriptions will be sent. If written prescriptions are taken to a different pharmacy, please inform the nursing staff. The pharmacy listed in the electronic medical record should be the one where you would like electronic prescriptions to be sent.  Prescription refills: Only during scheduled appointments. Applies to both, written and electronic prescriptions.  NOTE: The following applies primarily to controlled substances (Opioid* Pain Medications).   Patient's responsibilities: 1. Pain Pills: Bring all pain pills to every appointment (except for procedure appointments). 2. Pill Bottles: Bring pills in original pharmacy bottle. Always bring newest bottle. Bring bottle, even if empty. 3. Medication refills: You are responsible for knowing and keeping track of what medications you need refilled. The day before your appointment, write a list of all prescriptions that need to be refilled. Bring that list to your appointment and give it to the admitting nurse. Prescriptions will be written only during appointments. If you forget a medication, it will not be "Called in", "Faxed", or "electronically sent". You will need to get another appointment to get these prescribed. 4. Prescription Accuracy: You are responsible for carefully inspecting your prescriptions before leaving our office. Have the discharge nurse carefully go over each prescription with you, before taking them home. Make sure that your name is accurately spelled, that your address is correct. Check the name and dose of your medication to make sure it is accurate. Check the number of pills, and the written instructions to make sure they are clear and accurate. Make sure that you are given enough medication to  last until your next medication refill appointment. 5. Taking Medication: Take medication as prescribed. Never take more pills than instructed. Never take medication more frequently than prescribed. Taking less pills or less frequently is permitted and encouraged, when it comes to controlled substances (written prescriptions).  6. Inform other Doctors: Always inform, all of your healthcare providers, of all the medications you take. 7. Pain Medication from other Providers: You are not allowed to accept any additional pain medication from any other Doctor or Healthcare provider. There are two exceptions to this rule. (see below) In the event that you require additional pain medication, you are responsible for notifying us, as stated below. 8. Medication Agreement: You are responsible for carefully reading and following our Medication Agreement. This must be signed before receiving any prescriptions from our practice. Safely store a copy of your signed Agreement. Violations to the Agreement will result in no further prescriptions. (Additional copies of our Medication Agreement are available upon request.) 9. Laws, Rules, & Regulations: All patients are expected to follow all Federal and State Laws, Statutes, Rules, & Regulations. Ignorance of the Laws does not constitute a valid excuse. The use of any illegal substances is prohibited. 10. Adopted CDC guidelines & recommendations: Target dosing levels will be at or below 60 MME/day. Use of benzodiazepines** is not recommended.  Exceptions: There are only two exceptions to the rule of not receiving pain medications from other Healthcare Providers. 1. Exception #1 (Emergencies): In the event of an emergency (i.e.: accident requiring emergency care), you are allowed to receive additional pain medication. However, you are responsible for: As soon as you are able, call our office [phone], at any time of the day or night, and leave a message stating your  name, the date and nature of the emergency, and the name and dose of the medication   prescribed. In the event that your call is answered by a member of our staff, make sure to document and save the date, time, and the name of the person that took your information.  2. Exception #2 (Planned Surgery): In the event that you are scheduled by another doctor or dentist to have any type of surgery or procedure, you are allowed (for a period no longer than 30 days), to receive additional pain medication, for the acute post-op pain. However, in this case, you are responsible for picking up a copy of our "Post-op Pain Management for Surgeons" handout, and giving it to your surgeon or dentist. This document is available at our office, and does not require an appointment to obtain it. Simply go to our office during business hours (Monday-Thursday from 8:00 AM to 4:00 PM) (Friday 8:00 AM to 12:00 Noon) or if you have a scheduled appointment with us, prior to your surgery, and ask for it by name. In addition, you will need to provide us with your name, name of your surgeon, type of surgery, and date of procedure or surgery.  *Opioid medications include: morphine, codeine, oxycodone, oxymorphone, hydrocodone, hydromorphone, meperidine, tramadol, tapentadol, buprenorphine, fentanyl, methadone. **Benzodiazepine medications include: diazepam (Valium), alprazolam (Xanax), clonazepam (Klonopine), lorazepam (Ativan), clorazepate (Tranxene), chlordiazepoxide (Librium), estazolam (Prosom), oxazepam (Serax), temazepam (Restoril), triazolam (Halcion)  ____________________________________________________________________________________________  ____________________________________________________________________________________________  Pain Scale  Introduction: The pain score used by this practice is the Verbal Numerical Rating Scale (VNRS-11). This is an 11-point scale. It is for adults and children 10 years or older. There are  significant differences in how the pain score is reported, used, and applied. Forget everything you learned in the past and learn this scoring system.  General Information: The scale should reflect your current level of pain. Unless you are specifically asked for the level of your worst pain, or your average pain. If you are asked for one of these two, then it should be understood that it is over the past 24 hours.  Basic Activities of Daily Living (ADL): Personal hygiene, dressing, eating, transferring, and using restroom.  Instructions: Most patients tend to report their level of pain as a combination of two factors, their physical pain and their psychosocial pain. This last one is also known as "suffering" and it is reflection of how physical pain affects you socially and psychologically. From now on, report them separately. From this point on, when asked to report your pain level, report only your physical pain. Use the following table for reference.  Pain Clinic Pain Levels (0-5/10)  Pain Level Score  Description  No Pain 0   Mild pain 1 Nagging, annoying, but does not interfere with basic activities of daily living (ADL). Patients are able to eat, bathe, get dressed, toileting (being able to get on and off the toilet and perform personal hygiene functions), transfer (move in and out of bed or a chair without assistance), and maintain continence (able to control bladder and bowel functions). Blood pressure and heart rate are unaffected. A normal heart rate for a healthy adult ranges from 60 to 100 bpm (beats per minute).   Mild to moderate pain 2 Noticeable and distracting. Impossible to hide from other people. More frequent flare-ups. Still possible to adapt and function close to normal. It can be very annoying and may have occasional stronger flare-ups. With discipline, patients may get used to it and adapt.   Moderate pain 3 Interferes significantly with activities of daily living (ADL). It  becomes difficult   to feed, bathe, get dressed, get on and off the toilet or to perform personal hygiene functions. Difficult to get in and out of bed or a chair without assistance. Very distracting. With effort, it can be ignored when deeply involved in activities.   Moderately severe pain 4 Impossible to ignore for more than a few minutes. With effort, patients may still be able to manage work or participate in some social activities. Very difficult to concentrate. Signs of autonomic nervous system discharge are evident: dilated pupils (mydriasis); mild sweating (diaphoresis); sleep interference. Heart rate becomes elevated (>115 bpm). Diastolic blood pressure (lower number) rises above 100 mmHg. Patients find relief in laying down and not moving.   Severe pain 5 Intense and extremely unpleasant. Associated with frowning face and frequent crying. Pain overwhelms the senses.  Ability to do any activity or maintain social relationships becomes significantly limited. Conversation becomes difficult. Pacing back and forth is common, as getting into a comfortable position is nearly impossible. Pain wakes you up from deep sleep. Physical signs will be obvious: pupillary dilation; increased sweating; goosebumps; brisk reflexes; cold, clammy hands and feet; nausea, vomiting or dry heaves; loss of appetite; significant sleep disturbance with inability to fall asleep or to remain asleep. When persistent, significant weight loss is observed due to the complete loss of appetite and sleep deprivation.  Blood pressure and heart rate becomes significantly elevated. Caution: If elevated blood pressure triggers a pounding headache associated with blurred vision, then the patient should immediately seek attention at an urgent or emergency care unit, as these may be signs of an impending stroke.    Emergency Department Pain Levels (6-10/10)  Emergency Room Pain 6 Severely limiting. Requires emergency care and should not be  seen or managed at an outpatient pain management facility. Communication becomes difficult and requires great effort. Assistance to reach the emergency department may be required. Facial flushing and profuse sweating along with potentially dangerous increases in heart rate and blood pressure will be evident.   Distressing pain 7 Self-care is very difficult. Assistance is required to transport, or use restroom. Assistance to reach the emergency department will be required. Tasks requiring coordination, such as bathing and getting dressed become very difficult.   Disabling pain 8 Self-care is no longer possible. At this level, pain is disabling. The individual is unable to do even the most "basic" activities such as walking, eating, bathing, dressing, transferring to a bed, or toileting. Fine motor skills are lost. It is difficult to think clearly.   Incapacitating pain 9 Pain becomes incapacitating. Thought processing is no longer possible. Difficult to remember your own name. Control of movement and coordination are lost.   The worst pain imaginable 10 At this level, most patients pass out from pain. When this level is reached, collapse of the autonomic nervous system occurs, leading to a sudden drop in blood pressure and heart rate. This in turn results in a temporary and dramatic drop in blood flow to the brain, leading to a loss of consciousness. Fainting is one of the body's self defense mechanisms. Passing out puts the brain in a calmed state and causes it to shut down for a while, in order to begin the healing process.    Summary: 1. Refer to this scale when providing us with your pain level. 2. Be accurate and careful when reporting your pain level. This will help with your care. 3. Over-reporting your pain level will lead to loss of credibility. 4. Even a level of   1/10 means that there is pain and will be treated at our facility. 5. High, inaccurate reporting will be documented as "Symptom  Exaggeration", leading to loss of credibility and suspicions of possible secondary gains such as obtaining more narcotics, or wanting to appear disabled, for fraudulent reasons. 6. Only pain levels of 5 or below will be seen at our facility. 7. Pain levels of 6 and above will be sent to the Emergency Department and the appointment cancelled. ____________________________________________________________________________________________   BMI Assessment: Estimated body mass index is 38.73 kg/m as calculated from the following:   Height as of this encounter: 5\' 1"  (1.549 m).   Weight as of this encounter: 205 lb (93 kg).  BMI interpretation table: BMI level Category Range association with higher incidence of chronic pain  <18 kg/m2 Underweight   18.5-24.9 kg/m2 Ideal body weight   25-29.9 kg/m2 Overweight Increased incidence by 20%  30-34.9 kg/m2 Obese (Class I) Increased incidence by 68%  35-39.9 kg/m2 Severe obesity (Class II) Increased incidence by 136%  >40 kg/m2 Extreme obesity (Class III) Increased incidence by 254%   BMI Readings from Last 4 Encounters:  07/14/17 38.73 kg/m  06/05/17 37.36 kg/m  04/13/17 37.74 kg/m  03/31/17 39.11 kg/m   Wt Readings from Last 4 Encounters:  07/14/17 205 lb (93 kg)  06/05/17 201 lb (91.2 kg)  04/13/17 203 lb (92.1 kg)  03/31/17 207 lb (93.9 kg)    Oxycodone prescription given x3.

## 2017-07-19 LAB — TOXASSURE SELECT 13 (MW), URINE

## 2017-07-22 ENCOUNTER — Ambulatory Visit (INDEPENDENT_AMBULATORY_CARE_PROVIDER_SITE_OTHER): Payer: Medicare HMO | Admitting: *Deleted

## 2017-07-22 ENCOUNTER — Other Ambulatory Visit: Payer: Self-pay | Admitting: Cardiology

## 2017-07-22 ENCOUNTER — Telehealth: Payer: Self-pay | Admitting: Cardiology

## 2017-07-22 ENCOUNTER — Other Ambulatory Visit: Payer: Medicare HMO | Admitting: *Deleted

## 2017-07-22 DIAGNOSIS — I4891 Unspecified atrial fibrillation: Secondary | ICD-10-CM

## 2017-07-22 DIAGNOSIS — Z5181 Encounter for therapeutic drug level monitoring: Secondary | ICD-10-CM | POA: Diagnosis not present

## 2017-07-22 DIAGNOSIS — Z79899 Other long term (current) drug therapy: Secondary | ICD-10-CM

## 2017-07-22 LAB — BASIC METABOLIC PANEL
BUN/Creatinine Ratio: 24 — ABNORMAL HIGH (ref 9–20)
BUN: 18 mg/dL (ref 6–24)
CO2: 31 mmol/L — AB (ref 20–29)
Calcium: 8.4 mg/dL — ABNORMAL LOW (ref 8.7–10.2)
Chloride: 97 mmol/L (ref 96–106)
Creatinine, Ser: 0.76 mg/dL (ref 0.76–1.27)
GFR calc Af Amer: 118 mL/min/{1.73_m2} (ref >59)
GFR calc non Af Amer: 102 mL/min/{1.73_m2} (ref >59)
GLUCOSE: 98 mg/dL (ref 65–99)
POTASSIUM: 4.1 mmol/L (ref 3.5–5.2)
SODIUM: 138 mmol/L (ref 134–144)

## 2017-07-22 LAB — POCT INR: INR: 3.3

## 2017-07-22 NOTE — Patient Instructions (Signed)
Continue taking 1.5 tablets daily except 1 tablet on Mondays, Wednesdays and Fridays. Recheck INR in 4 weeks.  [facility] [phone]

## 2017-07-22 NOTE — Telephone Encounter (Signed)
Spoke to patient. He's been getting BMET checked every 1-2 months per Dr. [name]. Wants to have this done today when he goes in. Reviewed, last BMET on 10/5 OK per Dr. [name], patient made aware. I have ordered repeat BMET and scheduled him on [facility] lab calendar so that he can have this done when he goes for INR check today.

## 2017-07-22 NOTE — Telephone Encounter (Signed)
Mr.Fant is calling to get his Bmet levels and is wanted to have it checked today . Please call

## 2017-08-03 MED FILL — METHOCARBAMOL 500 MG TABS: 500 | 90 days supply | Qty: 270 | Fill #0

## 2017-08-19 MED FILL — DIGOXIN 250 MCG TABLET: 250 | 30 days supply | Qty: 30 | Fill #8

## 2017-09-17 MED FILL — DIGOXIN 250 MCG TABLET: 250 | 30 days supply | Qty: 30 | Fill #9

## 2017-10-12 ENCOUNTER — Other Ambulatory Visit: Payer: Self-pay | Admitting: Cardiology

## 2017-10-12 ENCOUNTER — Ambulatory Visit (INDEPENDENT_AMBULATORY_CARE_PROVIDER_SITE_OTHER): Payer: Medicare HMO | Admitting: Pharmacist

## 2017-10-12 DIAGNOSIS — Z5181 Encounter for therapeutic drug level monitoring: Secondary | ICD-10-CM

## 2017-10-12 DIAGNOSIS — I4891 Unspecified atrial fibrillation: Secondary | ICD-10-CM

## 2017-10-12 LAB — POCT INR: INR: 3.7

## 2017-10-12 MED ORDER — POTASSIUM CHLORIDE ER 10 MEQ PO TBCR: 20.0000 meq | EXTENDED_RELEASE_TABLET | Freq: Two times a day (BID) | ORAL | 0 refills | Status: DC

## 2017-10-12 NOTE — Telephone Encounter (Signed)
Pt calling requesting a refill on potassium chloride, 90 day supply sent to his pharmacy. Please address.

## 2017-10-12 NOTE — Patient Instructions (Signed)
Hold your dose of warfarin today, then continue taking 1.5 tablets daily except 1 tablet on Mondays, Wednesdays and Fridays. Recheck INR in 4 weeks.  [facility] [phone]

## 2017-10-15 ENCOUNTER — Ambulatory Visit: Payer: Medicare HMO | Attending: Nurse Practitioner | Admitting: Nurse Practitioner

## 2017-10-15 ENCOUNTER — Other Ambulatory Visit: Payer: Self-pay

## 2017-10-15 ENCOUNTER — Encounter: Payer: Self-pay | Admitting: Nurse Practitioner

## 2017-10-15 VITALS — BP 136/69 | HR 66 | Temp 98.3°F | Ht 61.0 in | Wt 198.0 lb

## 2017-10-15 DIAGNOSIS — Z7901 Long term (current) use of anticoagulants: Secondary | ICD-10-CM | POA: Insufficient documentation

## 2017-10-15 DIAGNOSIS — I110 Hypertensive heart disease with heart failure: Secondary | ICD-10-CM | POA: Diagnosis not present

## 2017-10-15 DIAGNOSIS — G8929 Other chronic pain: Secondary | ICD-10-CM | POA: Diagnosis not present

## 2017-10-15 DIAGNOSIS — Z951 Presence of aortocoronary bypass graft: Secondary | ICD-10-CM | POA: Insufficient documentation

## 2017-10-15 DIAGNOSIS — I252 Old myocardial infarction: Secondary | ICD-10-CM | POA: Diagnosis not present

## 2017-10-15 DIAGNOSIS — M109 Gout, unspecified: Secondary | ICD-10-CM | POA: Diagnosis not present

## 2017-10-15 DIAGNOSIS — M47816 Spondylosis without myelopathy or radiculopathy, lumbar region: Secondary | ICD-10-CM

## 2017-10-15 DIAGNOSIS — Z79899 Other long term (current) drug therapy: Secondary | ICD-10-CM | POA: Insufficient documentation

## 2017-10-15 DIAGNOSIS — I482 Chronic atrial fibrillation: Secondary | ICD-10-CM | POA: Insufficient documentation

## 2017-10-15 DIAGNOSIS — I872 Venous insufficiency (chronic) (peripheral): Secondary | ICD-10-CM | POA: Diagnosis not present

## 2017-10-15 DIAGNOSIS — I2510 Atherosclerotic heart disease of native coronary artery without angina pectoris: Secondary | ICD-10-CM | POA: Diagnosis not present

## 2017-10-15 DIAGNOSIS — K449 Diaphragmatic hernia without obstruction or gangrene: Secondary | ICD-10-CM | POA: Insufficient documentation

## 2017-10-15 DIAGNOSIS — D509 Iron deficiency anemia, unspecified: Secondary | ICD-10-CM | POA: Insufficient documentation

## 2017-10-15 DIAGNOSIS — M5441 Lumbago with sciatica, right side: Secondary | ICD-10-CM

## 2017-10-15 DIAGNOSIS — N529 Male erectile dysfunction, unspecified: Secondary | ICD-10-CM | POA: Diagnosis not present

## 2017-10-15 DIAGNOSIS — M5442 Lumbago with sciatica, left side: Secondary | ICD-10-CM

## 2017-10-15 DIAGNOSIS — G510 Bell's palsy: Secondary | ICD-10-CM | POA: Diagnosis not present

## 2017-10-15 DIAGNOSIS — Z952 Presence of prosthetic heart valve: Secondary | ICD-10-CM | POA: Insufficient documentation

## 2017-10-15 DIAGNOSIS — Z882 Allergy status to sulfonamides status: Secondary | ICD-10-CM | POA: Insufficient documentation

## 2017-10-15 DIAGNOSIS — F419 Anxiety disorder, unspecified: Secondary | ICD-10-CM | POA: Insufficient documentation

## 2017-10-15 DIAGNOSIS — G4733 Obstructive sleep apnea (adult) (pediatric): Secondary | ICD-10-CM | POA: Insufficient documentation

## 2017-10-15 DIAGNOSIS — M48061 Spinal stenosis, lumbar region without neurogenic claudication: Secondary | ICD-10-CM | POA: Insufficient documentation

## 2017-10-15 DIAGNOSIS — G894 Chronic pain syndrome: Secondary | ICD-10-CM | POA: Diagnosis not present

## 2017-10-15 DIAGNOSIS — M7918 Myalgia, other site: Secondary | ICD-10-CM | POA: Insufficient documentation

## 2017-10-15 DIAGNOSIS — I5033 Acute on chronic diastolic (congestive) heart failure: Secondary | ICD-10-CM | POA: Diagnosis not present

## 2017-10-15 DIAGNOSIS — E119 Type 2 diabetes mellitus without complications: Secondary | ICD-10-CM | POA: Diagnosis not present

## 2017-10-15 DIAGNOSIS — Z5181 Encounter for therapeutic drug level monitoring: Secondary | ICD-10-CM | POA: Diagnosis present

## 2017-10-15 DIAGNOSIS — E785 Hyperlipidemia, unspecified: Secondary | ICD-10-CM | POA: Diagnosis not present

## 2017-10-15 DIAGNOSIS — Z6837 Body mass index (BMI) 37.0-37.9, adult: Secondary | ICD-10-CM | POA: Insufficient documentation

## 2017-10-15 DIAGNOSIS — I340 Nonrheumatic mitral (valve) insufficiency: Secondary | ICD-10-CM | POA: Diagnosis not present

## 2017-10-15 DIAGNOSIS — Z888 Allergy status to other drugs, medicaments and biological substances status: Secondary | ICD-10-CM | POA: Insufficient documentation

## 2017-10-15 DIAGNOSIS — Z8673 Personal history of transient ischemic attack (TIA), and cerebral infarction without residual deficits: Secondary | ICD-10-CM | POA: Diagnosis not present

## 2017-10-15 DIAGNOSIS — Z880 Allergy status to penicillin: Secondary | ICD-10-CM | POA: Insufficient documentation

## 2017-10-15 MED ORDER — OXYCODONE HCL 5 MG PO TABS: 5.0000 mg | ORAL_TABLET | Freq: Three times a day (TID) | ORAL | 0 refills | Status: DC | PRN

## 2017-10-15 MED ORDER — METHOCARBAMOL 500 MG PO TABS: 500.0000 mg | ORAL_TABLET | Freq: Three times a day (TID) | ORAL | 0 refills | Status: DC

## 2017-10-15 NOTE — Patient Instructions (Addendum)
____________________________________________________________________________________________  Medication Rules  Applies to: All patients receiving prescriptions (written or electronic).  Pharmacy of record: Pharmacy where electronic prescriptions will be sent. If written prescriptions are taken to a different pharmacy, please inform the nursing staff. The pharmacy listed in the electronic medical record should be the one where you would like electronic prescriptions to be sent.  Prescription refills: Only during scheduled appointments. Applies to both, written and electronic prescriptions.  NOTE: The following applies primarily to controlled substances (Opioid* Pain Medications).   Patient's responsibilities: 1. Pain Pills: Bring all pain pills to every appointment (except for procedure appointments). 2. Pill Bottles: Bring pills in original pharmacy bottle. Always bring newest bottle. Bring bottle, even if empty. 3. Medication refills: You are responsible for knowing and keeping track of what medications you need refilled. The day before your appointment, write a list of all prescriptions that need to be refilled. Bring that list to your appointment and give it to the admitting nurse. Prescriptions will be written only during appointments. If you forget a medication, it will not be "Called in", "Faxed", or "electronically sent". You will need to get another appointment to get these prescribed. 4. Prescription Accuracy: You are responsible for carefully inspecting your prescriptions before leaving our office. Have the discharge nurse carefully go over each prescription with you, before taking them home. Make sure that your name is accurately spelled, that your address is correct. Check the name and dose of your medication to make sure it is accurate. Check the number of pills, and the written instructions to make sure they are clear and accurate. Make sure that you are given enough medication to  last until your next medication refill appointment. 5. Taking Medication: Take medication as prescribed. Never take more pills than instructed. Never take medication more frequently than prescribed. Taking less pills or less frequently is permitted and encouraged, when it comes to controlled substances (written prescriptions).  6. Inform other Doctors: Always inform, all of your healthcare providers, of all the medications you take. 7. Pain Medication from other Providers: You are not allowed to accept any additional pain medication from any other Doctor or Healthcare provider. There are two exceptions to this rule. (see below) In the event that you require additional pain medication, you are responsible for notifying us, as stated below. 8. Medication Agreement: You are responsible for carefully reading and following our Medication Agreement. This must be signed before receiving any prescriptions from our practice. Safely store a copy of your signed Agreement. Violations to the Agreement will result in no further prescriptions. (Additional copies of our Medication Agreement are available upon request.) 9. Laws, Rules, & Regulations: All patients are expected to follow all Federal and State Laws, Statutes, Rules, & Regulations. Ignorance of the Laws does not constitute a valid excuse. The use of any illegal substances is prohibited. 10. Adopted CDC guidelines & recommendations: Target dosing levels will be at or below 60 MME/day. Use of benzodiazepines** is not recommended.  Exceptions: There are only two exceptions to the rule of not receiving pain medications from other Healthcare Providers. 1. Exception #1 (Emergencies): In the event of an emergency (i.e.: accident requiring emergency care), you are allowed to receive additional pain medication. However, you are responsible for: As soon as you are able, call our office [phone], at any time of the day or night, and leave a message stating your  name, the date and nature of the emergency, and the name and dose of the medication   prescribed. In the event that your call is answered by a member of our staff, make sure to document and save the date, time, and the name of the person that took your information.  2. Exception #2 (Planned Surgery): In the event that you are scheduled by another doctor or dentist to have any type of surgery or procedure, you are allowed (for a period no longer than 30 days), to receive additional pain medication, for the acute post-op pain. However, in this case, you are responsible for picking up a copy of our "Post-op Pain Management for Surgeons" handout, and giving it to your surgeon or dentist. This document is available at our office, and does not require an appointment to obtain it. Simply go to our office during business hours (Monday-Thursday from 8:00 AM to 4:00 PM) (Friday 8:00 AM to 12:00 Noon) or if you have a scheduled appointment with us, prior to your surgery, and ask for it by name. In addition, you will need to provide us with your name, name of your surgeon, type of surgery, and date of procedure or surgery.  *Opioid medications include: morphine, codeine, oxycodone, oxymorphone, hydrocodone, hydromorphone, meperidine, tramadol, tapentadol, buprenorphine, fentanyl, methadone. **Benzodiazepine medications include: diazepam (Valium), alprazolam (Xanax), clonazepam (Klonopine), lorazepam (Ativan), clorazepate (Tranxene), chlordiazepoxide (Librium), estazolam (Prosom), oxazepam (Serax), temazepam (Restoril), triazolam (Halcion)  ____________________________________________________________________________________________  ____________________________________________________________________________________________  Pain Scale  Introduction: The pain score used by this practice is the Verbal Numerical Rating Scale (VNRS-11). This is an 11-point scale. It is for adults and children 10 years or older. There are  significant differences in how the pain score is reported, used, and applied. Forget everything you learned in the past and learn this scoring system.  General Information: The scale should reflect your current level of pain. Unless you are specifically asked for the level of your worst pain, or your average pain. If you are asked for one of these two, then it should be understood that it is over the past 24 hours.  Basic Activities of Daily Living (ADL): Personal hygiene, dressing, eating, transferring, and using restroom.  Instructions: Most patients tend to report their level of pain as a combination of two factors, their physical pain and their psychosocial pain. This last one is also known as "suffering" and it is reflection of how physical pain affects you socially and psychologically. From now on, report them separately. From this point on, when asked to report your pain level, report only your physical pain. Use the following table for reference.  Pain Clinic Pain Levels (0-5/10)  Pain Level Score  Description  No Pain 0   Mild pain 1 Nagging, annoying, but does not interfere with basic activities of daily living (ADL). Patients are able to eat, bathe, get dressed, toileting (being able to get on and off the toilet and perform personal hygiene functions), transfer (move in and out of bed or a chair without assistance), and maintain continence (able to control bladder and bowel functions). Blood pressure and heart rate are unaffected. A normal heart rate for a healthy adult ranges from 60 to 100 bpm (beats per minute).   Mild to moderate pain 2 Noticeable and distracting. Impossible to hide from other people. More frequent flare-ups. Still possible to adapt and function close to normal. It can be very annoying and may have occasional stronger flare-ups. With discipline, patients may get used to it and adapt.   Moderate pain 3 Interferes significantly with activities of daily living (ADL). It  becomes difficult   to feed, bathe, get dressed, get on and off the toilet or to perform personal hygiene functions. Difficult to get in and out of bed or a chair without assistance. Very distracting. With effort, it can be ignored when deeply involved in activities.   Moderately severe pain 4 Impossible to ignore for more than a few minutes. With effort, patients may still be able to manage work or participate in some social activities. Very difficult to concentrate. Signs of autonomic nervous system discharge are evident: dilated pupils (mydriasis); mild sweating (diaphoresis); sleep interference. Heart rate becomes elevated (>115 bpm). Diastolic blood pressure (lower number) rises above 100 mmHg. Patients find relief in laying down and not moving.   Severe pain 5 Intense and extremely unpleasant. Associated with frowning face and frequent crying. Pain overwhelms the senses.  Ability to do any activity or maintain social relationships becomes significantly limited. Conversation becomes difficult. Pacing back and forth is common, as getting into a comfortable position is nearly impossible. Pain wakes you up from deep sleep. Physical signs will be obvious: pupillary dilation; increased sweating; goosebumps; brisk reflexes; cold, clammy hands and feet; nausea, vomiting or dry heaves; loss of appetite; significant sleep disturbance with inability to fall asleep or to remain asleep. When persistent, significant weight loss is observed due to the complete loss of appetite and sleep deprivation.  Blood pressure and heart rate becomes significantly elevated. Caution: If elevated blood pressure triggers a pounding headache associated with blurred vision, then the patient should immediately seek attention at an urgent or emergency care unit, as these may be signs of an impending stroke.    Emergency Department Pain Levels (6-10/10)  Emergency Room Pain 6 Severely limiting. Requires emergency care and should not be  seen or managed at an outpatient pain management facility. Communication becomes difficult and requires great effort. Assistance to reach the emergency department may be required. Facial flushing and profuse sweating along with potentially dangerous increases in heart rate and blood pressure will be evident.   Distressing pain 7 Self-care is very difficult. Assistance is required to transport, or use restroom. Assistance to reach the emergency department will be required. Tasks requiring coordination, such as bathing and getting dressed become very difficult.   Disabling pain 8 Self-care is no longer possible. At this level, pain is disabling. The individual is unable to do even the most "basic" activities such as walking, eating, bathing, dressing, transferring to a bed, or toileting. Fine motor skills are lost. It is difficult to think clearly.   Incapacitating pain 9 Pain becomes incapacitating. Thought processing is no longer possible. Difficult to remember your own name. Control of movement and coordination are lost.   The worst pain imaginable 10 At this level, most patients pass out from pain. When this level is reached, collapse of the autonomic nervous system occurs, leading to a sudden drop in blood pressure and heart rate. This in turn results in a temporary and dramatic drop in blood flow to the brain, leading to a loss of consciousness. Fainting is one of the body's self defense mechanisms. Passing out puts the brain in a calmed state and causes it to shut down for a while, in order to begin the healing process.    Summary: 1. Refer to this scale when providing us with your pain level. 2. Be accurate and careful when reporting your pain level. This will help with your care. 3. Over-reporting your pain level will lead to loss of credibility. 4. Even a level of   1/10 means that there is pain and will be treated at our facility. 5. High, inaccurate reporting will be documented as "Symptom  Exaggeration", leading to loss of credibility and suspicions of possible secondary gains such as obtaining more narcotics, or wanting to appear disabled, for fraudulent reasons. 6. Only pain levels of 5 or below will be seen at our facility. 7. Pain levels of 6 and above will be sent to the Emergency Department and the appointment cancelled. ____________________________________________________________________________________________   BMI Assessment: Estimated body mass index is 37.41 kg/m as calculated from the following:   Height as of this encounter: 5\' 1"  (1.549 m).   Weight as of this encounter: 198 lb (89.8 kg).  BMI interpretation table: BMI level Category Range association with higher incidence of chronic pain  <18 kg/m2 Underweight   18.5-24.9 kg/m2 Ideal body weight   25-29.9 kg/m2 Overweight Increased incidence by 20%  30-34.9 kg/m2 Obese (Class I) Increased incidence by 68%  35-39.9 kg/m2 Severe obesity (Class II) Increased incidence by 136%  >40 kg/m2 Extreme obesity (Class III) Increased incidence by 254%   BMI Readings from Last 4 Encounters:  10/15/17 37.41 kg/m  07/14/17 38.73 kg/m  06/05/17 37.36 kg/m  04/13/17 37.74 kg/m   Wt Readings from Last 4 Encounters:  10/15/17 198 lb (89.8 kg)  07/14/17 205 lb (93 kg)  06/05/17 201 lb (91.2 kg)  04/13/17 203 lb (92.1 kg)

## 2017-10-15 NOTE — Progress Notes (Signed)
Patient's Name: Lance Cook  MRN: [id number]  Referring Provider: [name], PA-C  DOB: April 06, 1961  PCP: Selinda Orion  DOS: 10/15/2017  Note by: Vevelyn Francois NP  Service setting: Ambulatory outpatient  Specialty: Interventional Pain Management  Location: [facility] (AMB) Pain Management Facility    Patient type: Established    Primary Reason(s) for Visit: Encounter for prescription drug management. (Level of risk: moderate)  CC: Back Pain (lower back)  HPI  Lance Cook is a 57 y.o. year old, male patient, who comes today for a medication management evaluation. 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; Ascending aortic dissection ([place]); Chronic diastolic congestive heart failure (HCC); 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; Enteritis due to Clostridium difficile; Hypomagnesemia; Physical deconditioning; Long term current use of opiate analgesic; Long term prescription opiate use; Opiate use (22.5 MME/day); Opiate dependence ([place]); Encounter for therapeutic drug level monitoring; Chronic low back pain (midline); Lumbar facet syndrome; Personal history of transient ischemic attack (TIA), and cerebral infarction without residual deficits; ED (erectile dysfunction) of organic origin; Eunuchoidism; Adult BMI 30+; Obstructive apnea; Testicular hypofunction; Lumbar canal stenosis; Malignant neoplasm of testis ([place]);  Chronic lower extremity pain (Left); Personal history of malignant neoplasm of testis; Musculoskeletal pain; History of colonic polyps; Chronic diarrhea; Benign neoplasm of transverse colon; Scrotal swelling; Chronic pain syndrome; Acute on chronic diastolic CHF (congestive heart failure) (HCC); S/P MVR (mitral valve replacement); H/O TIA (transient ischemic attack) and stroke; Heart valve replaced by other means; HTN (hypertension); Low back pain; Lymphadenopathy; Male hypogonadism; Obesity (BMI 30-39.9); Obstructive sleep apnea; Other testicular hypofunction; Presence of other heart-valve replacement; S/P AVR (aortic valve replacement); Spinal stenosis of lumbar region without neurogenic claudication; Spondylosis; Type 2 diabetes mellitus with other specified complication ([place]); Type II or unspecified type diabetes mellitus with other specified manifestations, not stated as uncontrolled; Leg swelling; and Unilateral vocal cord paralysis on their problem list. His primarily concern today is the Back Pain (lower back)  Pain Assessment: Location: Lower Back Radiating: Denies Onset: More than a month ago Duration: Chronic pain Quality: Sharp, Stabbing, Constant, Discomfort Severity: 6 /10 (self-reported pain score)  Note: Reported level is compatible with observation. Clinically the patient looks like a 1/10 A 1/10 is viewed as "Mild" and described as nagging, annoying, but not interfering with basic activities of daily living (ADL). Lance Cook is able to eat, bathe, get dressed, do toileting (being able to get on and off the toilet and perform personal hygiene functions), transfer (move in and out of bed or a chair without assistance), and maintain continence (able to control bladder and bowel functions). Physiologic parameters such as blood pressure and heart rate apear wnl. Information on the proper use of the pain scale provided to the patient today. When using our objective Pain Scale, levels between 6  and 10/10 are said to belong in an emergency room, as it progressively worsens from a 6/10, described as severely limiting, requiring emergency care not usually available at an outpatient pain management facility. At a 6/10 level, communication becomes difficult and requires  great effort. Assistance to reach the emergency department may be required. Facial flushing and profuse sweating along with potentially dangerous increases in heart rate and blood pressure will be evident. Timing: Constant Modifying factors: lay down resting  Lance Cook was last scheduled for an appointment on 07/14/2017 for medication management. During today's appointment we reviewed Lance Cook's chronic pain status, as well as his outpatient medication regimen. He admits that his pain is a 6 right now. He admits that it ranges from a 3-9/10. He denies any constipation or additional side effects of his medication. He admits that he is the process of trying to go back to work.  The patient  reports that he does not use drugs. His body mass index is 37.41 kg/m.  Further details on both, my assessment(s), as well as the proposed treatment plan, please see below.  Controlled Substance Pharmacotherapy Assessment REMS (Risk Evaluation and Mitigation Strategy)  Analgesic:Oxycodone IR 27m q 8hrs (15 mg/day of oxycodone) MME/day:22.5 mg/day   BChauncey Fischer RN  10/15/2017 11:48 AM  Sign at close encounter Nursing Pain Medication Assessment:  Safety precautions to be maintained throughout the outpatient stay will include: orient to surroundings, keep bed in low position, maintain call bell within reach at all times, provide assistance with transfer out of bed and ambulation.  Medication Inspection Compliance: Pill count conducted under aseptic conditions, in front of the patient. Neither the pills nor the bottle was removed from the patient's sight at any time. Once count was completed pills were immediately returned to the patient  in their original bottle.  Medication: Oxycodone IR Pill/Patch Count: 41 of 90 pills remain Pill/Patch Appearance: Markings consistent with prescribed medication Bottle Appearance: Standard pharmacy container. Clearly labeled. Filled Date: 01 / 27 / 2018 Last Medication intake:  Today   Pharmacokinetics: Liberation and absorption (onset of action): WNL Distribution (time to peak effect): WNL Metabolism and excretion (duration of action): WNL         Pharmacodynamics: Desired effects: Analgesia: Lance Cook >50% benefit. Functional ability: Patient reports that medication allows him to accomplish basic ADLs Clinically meaningful improvement in function (CMIF): Sustained CMIF goals met Perceived effectiveness: Described as relatively effective, allowing for increase in activities of daily living (ADL) Undesirable effects: Side-effects or Adverse reactions: None reported Monitoring: [place] PMP: Online review of the past 137-montheriod conducted. Compliant with practice rules and regulations Last UDS on record: Summary  Date Value Ref Range Status  07/14/2017 FINAL  Final    Comment:    ==================================================================== TOXASSURE SELECT 13 (MW) ==================================================================== Test                             Result       Flag       Units Drug Present and Declared for Prescription Verification   Temazepam                      250          EXPECTED   ng/mg creat    Temazepam may be administered as a scheduled prescription    medication; it is also an expected metabolite of diazepam.   Oxycodone                      686          EXPECTED   ng/mg creat   Oxymorphone  236          EXPECTED   ng/mg creat   Noroxycodone                   1214         EXPECTED   ng/mg creat    Sources of oxycodone include scheduled prescription medications.    Oxymorphone and noroxycodone are expected metabolites  of    oxycodone. Oxymorphone is also available as a scheduled    prescription medication. ==================================================================== Test                      Result    Flag   Units      Ref Range   Creatinine              28               mg/dL      >=20 ==================================================================== Declared Medications:  The flagging and interpretation on this report are based on the  following declared medications.  Unexpected results may arise from  inaccuracies in the declared medications.  **Note: The testing scope of this panel includes these medications:  Oxycodone  Temazepam  **Note: The testing scope of this panel does not include following  reported medications:  Acetaminophen  Allopurinol  Atenolol  Digoxin  Furosemide  Magnesium Oxide  Methocarbamol  Potassium  Silver  Topical  Warfarin ==================================================================== For clinical consultation, please call [phone]. ====================================================================    UDS interpretation: Compliant          Medication Assessment Form: Reviewed. Patient indicates being compliant with therapy Treatment compliance: Compliant Risk Assessment Profile: Aberrant behavior: See prior evaluations. None observed or detected today Comorbid factors increasing risk of overdose: See prior notes. No additional risks detected today Risk of substance use disorder (SUD): Low  ORT Scoring interpretation table:  Score <3 = Low Risk for SUD  Score between 4-7 = Moderate Risk for SUD  Score >8 = High Risk for Opioid Abuse   Risk Mitigation Strategies:  Patient Counseling: Covered Patient-Prescriber Agreement (PPA): Present and active  Notification to other healthcare providers: Done  Pharmacologic Plan: No change in therapy, at this time.             Laboratory Chemistry  Inflammation Markers (CRP: Acute Phase)  (ESR: Chronic Phase) Lab Results  Component Value Date   CRP 0.9 10/09/2015   ESRSEDRATE 9 01/14/2016   LATICACIDVEN 1.59 02/05/2016                         Rheumatology Markers Lab Results  Component Value Date   LABURIC 12.1 (H) 11/01/2009                Renal Function Markers Lab Results  Component Value Date   BUN 18 07/22/2017   CREATININE 0.76 07/22/2017   GFRAA 118 07/22/2017   GFRNONAA 102 07/22/2017                 Hepatic Function Markers Lab Results  Component Value Date   AST 21 02/05/2016   ALT 19 02/05/2016   ALBUMIN 2.6 (L) 02/05/2016   ALKPHOS 55 02/05/2016                 Electrolytes Lab Results  Component Value Date   NA 138 07/22/2017   K 4.1 07/22/2017   CL 97 07/22/2017   CALCIUM 8.4 (L) 07/22/2017  MG 2.0 01/16/2017   PHOS 3.5 02/09/2015                        Neuropathy Markers Lab Results  Component Value Date   VITAMINB12 371 01/07/2012   FOLATE 15.1 01/07/2012   HGBA1C 5.9 (H) 01/22/2015                 Bone Pathology Markers Lab Results  Component Value Date   TESTOFREE 49.5 03/22/2012   TESTOSTERONE 206.14 (L) 08/18/2012                         Coagulation Parameters Lab Results  Component Value Date   INR 3.7 10/12/2017   LABPROT 53.1 (H) 06/03/2017   APTT 41 (H) 01/24/2015   PLT 127 (L) 09/03/2016                 Cardiovascular Markers Lab Results  Component Value Date   BNP 260.5 (H) 08/27/2016   CKTOTAL 87 10/28/2009   CKMB 3.1 10/28/2009   TROPONINI 0.02        NO INDICATION OF MYOCARDIAL INJURY. 10/28/2009   HGB 10.9 (L) 09/05/2016   HCT 32.0 (L) 09/05/2016                 CA Markers No results found for: CEA, CA125, LABCA2               Note: Lab results reviewed.  Recent Diagnostic Imaging Results   Complexity Note: Imaging results reviewed. Results shared with Lance Cook, using Layman's terms.                         Meds   Current Outpatient Medications:  .  acetaminophen (TYLENOL) 500  MG tablet, Take 500 mg by mouth every 8 (eight) hours as needed for moderate pain or headache. , Disp: , Rfl:  .  allopurinol (ZYLOPRIM) 300 MG tablet, Take 300 mg by mouth daily., Disp: , Rfl:  .  atenolol (TENORMIN) 25 MG tablet, Take 1 tablet (25 mg total) by mouth 2 (two) times daily., Disp: , Rfl:  .  bacitracin 500 UNIT/GM ointment, Apply 1 application topically as needed for wound care. , Disp: , Rfl:  .  digoxin (LANOXIN) 0.25 MG tablet, Take 1 tablet (0.25 mg total) by mouth daily., Disp: 90 tablet, Rfl: 3 .  furosemide (LASIX) 80 MG tablet, Take 1 tablet (80 mg total) by mouth 2 (two) times daily., Disp: 180 tablet, Rfl: 3 .  magnesium oxide (MAG-OX) 400 (241.3 MG) MG tablet, Take 1 tablet (400 mg total) by mouth 2 (two) times daily., Disp: 180 tablet, Rfl: 3 .  [START ON 10/27/2017] methocarbamol (ROBAXIN) 500 MG tablet, Take 1 tablet (500 mg total) by mouth 3 (three) times daily., Disp: 270 tablet, Rfl: 0 .  [START ON 12/26/2017] oxyCODONE (OXY IR/ROXICODONE) 5 MG immediate release tablet, Take 1 tablet (5 mg total) by mouth every 8 (eight) hours as needed for moderate pain or severe pain., Disp: 90 tablet, Rfl: 0 .  potassium chloride (K-DUR) 10 MEQ tablet, Take 2 tablets (20 mEq total) by mouth 2 (two) times daily., Disp: 120 tablet, Rfl: 0 .  silver sulfADIAZINE (SILVADENE) 1 % cream, Apply 1 application topically daily., Disp: , Rfl:  .  temazepam (RESTORIL) 15 MG capsule, Take 15 mg by mouth at bedtime as needed for sleep., Disp: , Rfl:  .  warfarin (COUMADIN) 5 MG tablet, TAKE 1 TO 1 AND 1/2 TABLETS DAILY AS DIRECTED BY COUMADIN CLINIC, Disp: 135 tablet, Rfl: 1 .  [START ON 11/26/2017] oxyCODONE (OXY IR/ROXICODONE) 5 MG immediate release tablet, Take 1 tablet (5 mg total) by mouth every 8 (eight) hours as needed for severe pain., Disp: 90 tablet, Rfl: 0 .  [START ON 10/27/2017] oxyCODONE (OXY IR/ROXICODONE) 5 MG immediate release tablet, Take 1 tablet (5 mg total) by mouth every 8 (eight)  hours as needed for severe pain., Disp: 90 tablet, Rfl: 0  ROS  Constitutional: Denies any fever or chills Gastrointestinal: No reported hemesis, hematochezia, vomiting, or acute GI distress Musculoskeletal: Denies any acute onset joint swelling, redness, loss of ROM, or weakness Neurological: No reported episodes of acute onset apraxia, aphasia, dysarthria, agnosia, amnesia, paralysis, loss of coordination, or loss of consciousness  Allergies  Lance Cook is allergic to penicillins; adhesive [tape]; and bactrim [sulfamethoxazole-trimethoprim].  [place]  Drug: Lance Cook  reports that he does not use drugs. Alcohol:  reports that he drinks alcohol. Tobacco:  reports that he has been smoking cigars.  he has never used smokeless tobacco. Medical:  has a past medical history of Anemia, Anxiety, ARDS (adult respiratory distress syndrome) ([place]) (01/27/2015), Arthritis, Ascending aortic dissection (HCC) (07/14/2008), Asymptomatic chronic venous hypertension (01/15/2010), Atrial fibrillation (HCC), Bell's palsy, CAD (coronary artery disease), Cellulitis (04/02/2014), Cerebral artery occlusion with cerebral infarction ([place]) (10/08/2011), Chronic diastolic heart failure ([place]) (03/01/2015), Chronic LBP (10/08/2011), CVA (cerebral vascular accident) ([place]) (10/08/2011), DIABETES MELLITUS, TYPE II (11/01/2009), Diverticulosis, 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), Encephalopathy acute (02/05/2015), GERD (gastroesophageal reflux disease), Gout, H/O mechanical aortic valve replacement (11/01/2009), Hiatal hernia, History of colon polyps (12/23/2011), Hyperlipidemia, Hypertension, Hypogonadism male (04/08/2012), Impaired glucose tolerance (10/08/2011), Morbid obesity ([place]) (04/02/2014), Myocardial infarction [facility]) (age 50), OSA (obstructive sleep apnea), Peripheral vascular disease ([place]), 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 Surgical: Mr. Housey  has a past surgical history that includes Laser ablation (Left, 03/06/2010); Cardiac valve replacement; Cholecystectomy (2011); Tonsillectomy (1967); Otoplasty; Orchiectomy (age 62); TEE without cardioversion (N/A, 10/11/2014); Bentall procedure (1988); Coronary artery bypass graft; aortic truck; TEE without cardioversion (N/A, 01/24/2015); Mitral valve replacement (Right, 01/24/2015); Tooth extraction (2016); and Colonoscopy with propofol (N/A, 06/17/2016). Family: family history includes Cancer in his maternal aunt and maternal uncle; Diabetes in his father; Heart disease in his mother; Hypertension in his other; Other in his mother; Stroke in his other; Throat cancer in his mother.  Constitutional Exam  General appearance: Well nourished, well developed, and well hydrated. In no apparent acute distress Vitals:   10/15/17 1137  BP: 136/69  Pulse: 66  Temp: 98.3 F (36.8 C)  SpO2: 100%  Weight: 198 lb (89.8 kg)  Height: _0  (1.549 m)   Psych/Mental status: Alert, oriented x 3 (person, place, & time)       Eyes: PERLA Respiratory: No evidence of acute respiratory distress  Lumbar Spine Area Exam  Skin & Axial Inspection: No masses, redness, or swelling Alignment: Symmetrical Functional ROM: Unrestricted ROM      Stability: No instability detected Muscle Tone/Strength: Functionally intact. No obvious neuro-muscular anomalies detected. Sensory (Neurological): Unimpaired Palpation: Complains of area being tender to palpation       Provocative Tests: Lumbar Hyperextension and rotation test: evaluation deferred today  Lumbar Lateral bending test: evaluation deferred today       Patrick's Maneuver: evaluation deferred today                    Gait & Posture Assessment  Ambulation: Unassisted Gait: Relatively normal for age and body  habitus Posture: WNL   Lower Extremity Exam    Side: Right lower extremity  Side: Left lower extremity  Skin & Extremity Inspection: Skin color, temperature, and hair growth are WNL. No peripheral edema or cyanosis. No masses, redness, swelling, asymmetry, or associated skin lesions. No contractures.  Skin & Extremity Inspection: Skin color, temperature, and hair growth are WNL. No peripheral edema or cyanosis. No masses, redness, swelling, asymmetry, or associated skin lesions. No contractures.  Functional ROM: Unrestricted ROM          Functional ROM: Unrestricted ROM          Muscle Tone/Strength: Functionally intact. No obvious neuro-muscular anomalies detected.  Muscle Tone/Strength: Functionally intact. No obvious neuro-muscular anomalies detected.  Sensory (Neurological): Unimpaired  Sensory (Neurological): Unimpaired  Palpation: No palpable anomalies  Palpation: No palpable anomalies   Assessment  Primary Diagnosis & Pertinent Problem List: The primary encounter diagnosis was Chronic low back pain (midline). Diagnoses of Spinal stenosis of lumbar region, unspecified whether neurogenic claudication present, Lumbar facet syndrome, Chronic pain syndrome, and Musculoskeletal pain were also pertinent to this visit.  Status Diagnosis  Controlled Controlled Controlled 1. Chronic low back pain (midline)   2. Spinal stenosis of lumbar region, unspecified whether neurogenic claudication present   3. Lumbar facet syndrome   4. Chronic pain syndrome   5. Musculoskeletal pain     Problems updated and reviewed during this visit: No problems updated. Plan of Care  Pharmacotherapy (Medications Ordered): Meds ordered this encounter  Medications  . oxyCODONE (OXY IR/ROXICODONE) 5 MG immediate release tablet    Sig: Take 1 tablet (5 mg total) by mouth every 8 (eight) hours as needed for moderate pain or severe pain.    Dispense:  90 tablet    Refill:  0    Do not place this medication, or any  other prescription from our practice, on "Automatic Refill". Patient may have prescription filled one day early if pharmacy is closed on scheduled refill date. Do not fill until: 12/26/2017 To last until:01/25/2018    Order Specific Question:   Supervising Provider    Answer:   Milinda Pointer [phone]  . oxyCODONE (OXY IR/ROXICODONE) 5 MG immediate release tablet    Sig: Take 1 tablet (5 mg total) by mouth every 8 (eight) hours as needed for severe pain.    Dispense:  90 tablet    Refill:  0    Do not place this medication, or any other prescription from our practice, on "Automatic Refill". Patient may have prescription filled one day early if pharmacy is closed on scheduled refill date. Do not fill until:11/26/2017 To last until:12/26/2017    Order Specific Question:   Supervising Provider    Answer:   Milinda Pointer [phone]  . oxyCODONE (OXY IR/ROXICODONE) 5 MG immediate release tablet    Sig: Take 1 tablet (5 mg total) by mouth every 8 (eight) hours as needed for severe pain.    Dispense:  90 tablet    Refill:  0    Do not place this medication, or any other prescription from our practice, on "Automatic Refill". Patient may have prescription filled one day early if pharmacy is closed on scheduled  refill date. Do not fill until: 10/27/2017 To last until:11/26/2017    Order Specific Question:   Supervising Provider    Answer:   Milinda Pointer [phone]  . methocarbamol (ROBAXIN) 500 MG tablet    Sig: Take 1 tablet (500 mg total) by mouth 3 (three) times daily.    Dispense:  270 tablet    Refill:  0    Do not place this medication, or any other prescription from our practice, on "Automatic Refill". Patient may have prescription filled one day early if pharmacy is closed on scheduled refill date.    Order Specific Question:   Supervising Provider    Answer:   Milinda Pointer [[id number]]   New Prescriptions   No medications on file   Medications administered today: Kwadwo Taras  had no medications administered during this visit. Lab-work, procedure(s), and/or referral(s): No orders of the defined types were placed in this encounter.  Imaging and/or referral(s): None  Interventional therapies: Planned, scheduled, and/or pending:  Not at this time.   Considering:  Bilateral lumbar facet RFA.    Palliative PRN treatment(s):  Not at this time     Provider-requested follow-up: Return in about 3 months (around 01/12/2018) for MedMgmt with Me Donella Stade [facility] Pace).  Future Appointments  Date Time Provider [place]  11/09/2017  9:15 AM [facility]  12/07/2017  9:20 AM [name], MD [facility]  01/12/2018  9:15 AM Vevelyn Francois, NP [facility] None   Primary Care Physician: Selinda Orion Location: [facility] Outpatient Pain Management Facility Note by: Vevelyn Francois NP Date: 10/15/2017; Time: 1:11 PM  Pain Score Disclaimer: We use the NRS-11 scale. This is a self-reported, subjective measurement of pain severity with only modest accuracy. It is used primarily to identify changes within a particular patient. It must be understood that outpatient pain scales are significantly less accurate that those used for research, where they can be applied under ideal controlled circumstances with minimal exposure to variables. In reality, the score is likely to be a combination of pain intensity and pain affect, where pain affect describes the degree of emotional arousal or changes in action readiness caused by the sensory experience of pain. Factors such as social and work situation, setting, emotional state, anxiety levels, expectation, and prior pain experience may influence pain perception and show large inter-individual differences that may also be affected by time variables.  Patient instructions provided during this appointment: Patient Instructions     ____________________________________________________________________________________________  Medication Rules  Applies to: All patients receiving prescriptions (written or electronic).  Pharmacy of record: Pharmacy where electronic prescriptions will be sent. If written prescriptions are taken to a different pharmacy, please inform the nursing staff. The pharmacy listed in the electronic medical record should be the one where you would like electronic prescriptions to be sent.  Prescription refills: Only during scheduled appointments. Applies to both, written and electronic prescriptions.  NOTE: The following applies primarily to controlled substances (Opioid* Pain Medications).   Patient's responsibilities: 1. Pain Pills: Bring all pain pills to every appointment (except for procedure appointments). 2. Pill Bottles: Bring pills in original pharmacy bottle. Always bring newest bottle. Bring bottle, even if empty. 3. Medication refills: You are responsible for knowing and keeping track of what medications you need refilled. The day before your appointment, write a list of all prescriptions that need to be refilled. Bring that list to your appointment and give it to the admitting nurse. Prescriptions will be written only  during appointments. If you forget a medication, it will not be "Called in", "Faxed", or "electronically sent". You will need to get another appointment to get these prescribed. 4. Prescription Accuracy: You are responsible for carefully inspecting your prescriptions before leaving our office. Have the discharge nurse carefully go over each prescription with you, before taking them home. Make sure that your name is accurately spelled, that your address is correct. Check the name and dose of your medication to make sure it is accurate. Check the number of pills, and the written instructions to make sure they are clear and accurate. Make sure that you are given enough medication  to last until your next medication refill appointment. 5. Taking Medication: Take medication as prescribed. Never take more pills than instructed. Never take medication more frequently than prescribed. Taking less pills or less frequently is permitted and encouraged, when it comes to controlled substances (written prescriptions).  6. Inform other Doctors: Always inform, all of your healthcare providers, of all the medications you take. 7. Pain Medication from other Providers: You are not allowed to accept any additional pain medication from any other Doctor or Healthcare provider. There are two exceptions to this rule. (see below) In the event that you require additional pain medication, you are responsible for notifying us, as stated below. 8. Medication Agreement: You are responsible for carefully reading and following our Medication Agreement. This must be signed before receiving any prescriptions from our practice. Safely store a copy of your signed Agreement. Violations to the Agreement will result in no further prescriptions. (Additional copies of our Medication Agreement are available upon request.) 9. Laws, Rules, & Regulations: All patients are expected to follow all Federal and [employer], [employer], Rules, [employer] [facility] [employer]. Ignorance of the Laws does not constitute a valid excuse. The use of any illegal substances is prohibited. 10. Adopted CDC guidelines & recommendations: Target dosing levels will be at or below 60 MME/day. Use of benzodiazepines** is not recommended.  Exceptions: There are only two exceptions to the rule of not receiving pain medications from other Healthcare Providers. 1. Exception #1 (Emergencies): In the event of an emergency (i.e.: accident requiring emergency care), you are allowed to receive additional pain medication. However, you are responsible for: As soon as you are able, call our office [phone], at any time of the day or night, and leave a message stating your  name, the date and nature of the emergency, and the name and dose of the medication prescribed. In the event that your call is answered by a member of our staff, make sure to document and save the date, time, and the name of the person that took your information.  2. Exception #2 (Planned Surgery): In the event that you are scheduled by another doctor or dentist to have any type of surgery or procedure, you are allowed (for a period no longer than 30 days), to receive additional pain medication, for the acute post-op pain. However, in this case, you are responsible for picking up a copy of our "Post-op Pain Management for Surgeons" handout, and giving it to your surgeon or dentist. This document is available at our office, and does not require an appointment to obtain it. Simply go to our office during business hours (Monday-Thursday from 8:00 AM to 4:00 PM) (Friday 8:00 AM to 12:00 Noon) or if you have a scheduled appointment with [place], prior to your surgery, and ask for it by name. In addition, you will need to provide [place]  with your name, name of your surgeon, type of surgery, and date of procedure or surgery.  *Opioid medications include: morphine, codeine, oxycodone, oxymorphone, hydrocodone, hydromorphone, meperidine, tramadol, tapentadol, buprenorphine, fentanyl, methadone. **Benzodiazepine medications include: diazepam (Valium), alprazolam (Xanax), clonazepam (Klonopine), lorazepam (Ativan), clorazepate (Tranxene), chlordiazepoxide (Librium), estazolam (Prosom), oxazepam (Serax), temazepam (Restoril), triazolam (Halcion)  ____________________________________________________________________________________________  ____________________________________________________________________________________________  Pain Scale  Introduction: The pain score used by this practice is the Verbal Numerical Rating Scale (VNRS-11). This is an 11-point scale. It is for adults and children 10 years or older. There are  significant differences in how the pain score is reported, used, and applied. Forget everything you learned in the past and learn this scoring system.  General Information: The scale should reflect your current level of pain. Unless you are specifically asked for the level of your worst pain, or your average pain. If you are asked for one of these two, then it should be understood that it is over the past 24 hours.  Basic Activities of Daily Living (ADL): Personal hygiene, dressing, eating, transferring, and using restroom.  Instructions: Most patients tend to report their level of pain as a combination of two factors, their physical pain and their psychosocial pain. This last one is also known as "suffering" and it is reflection of how physical pain affects you socially and psychologically. From now on, report them separately. From this point on, when asked to report your pain level, report only your physical pain. Use the following table for reference.  Pain Clinic Pain Levels (0-5/10)  Pain Level Score  Description  No Pain 0   Mild pain 1 Nagging, annoying, but does not interfere with basic activities of daily living (ADL). Patients are able to eat, bathe, get dressed, toileting (being able to get on and off the toilet and perform personal hygiene functions), transfer (move in and out of bed or a chair without assistance), and maintain continence (able to control bladder and bowel functions). Blood pressure and heart rate are unaffected. A normal heart rate for a healthy adult ranges from 60 to 100 bpm (beats per minute).   Mild to moderate pain 2 Noticeable and distracting. Impossible to hide from other people. More frequent flare-ups. Still possible to adapt and function close to normal. It can be very annoying and may have occasional stronger flare-ups. With discipline, patients may get used to it and adapt.   Moderate pain 3 Interferes significantly with activities of daily living (ADL). It  becomes difficult to feed, bathe, get dressed, get on and off the toilet or to perform personal hygiene functions. Difficult to get in and out of bed or a chair without assistance. Very distracting. With effort, it can be ignored when deeply involved in activities.   Moderately severe pain 4 Impossible to ignore for more than a few minutes. With effort, patients may still be able to manage work or participate in some social activities. Very difficult to concentrate. Signs of autonomic nervous system discharge are evident: dilated pupils (mydriasis); mild sweating (diaphoresis); sleep interference. Heart rate becomes elevated (>115 bpm). Diastolic blood pressure (lower number) rises above 100 mmHg. Patients find relief in laying down and not moving.   Severe pain 5 Intense and extremely unpleasant. Associated with frowning face and frequent crying. Pain overwhelms the senses.  Ability to do any activity or maintain social relationships becomes significantly limited. Conversation becomes difficult. Pacing back and forth is common, as getting into a comfortable position is nearly impossible. Pain wakes you up  from deep sleep. Physical signs will be obvious: pupillary dilation; increased sweating; goosebumps; brisk reflexes; cold, clammy hands and feet; nausea, vomiting or dry heaves; loss of appetite; significant sleep disturbance with inability to fall asleep or to remain asleep. When persistent, significant weight loss is observed due to the complete loss of appetite and sleep deprivation.  Blood pressure and heart rate becomes significantly elevated. Caution: If elevated blood pressure triggers a pounding headache associated with blurred vision, then the patient should immediately seek attention at an urgent or emergency care unit, as these may be signs of an impending stroke.    Emergency Department Pain Levels (6-10/10)  Emergency Room Pain 6 Severely limiting. Requires emergency care and should not be  seen or managed at an outpatient pain management facility. Communication becomes difficult and requires great effort. Assistance to reach the emergency department may be required. Facial flushing and profuse sweating along with potentially dangerous increases in heart rate and blood pressure will be evident.   Distressing pain 7 Self-care is very difficult. Assistance is required to transport, or use restroom. Assistance to reach the emergency department will be required. Tasks requiring coordination, such as bathing and getting dressed become very difficult.   Disabling pain 8 Self-care is no longer possible. At this level, pain is disabling. The individual is unable to do even the most "basic" activities such as walking, eating, bathing, dressing, transferring to a bed, or toileting. Fine motor skills are lost. It is difficult to think clearly.   Incapacitating pain 9 Pain becomes incapacitating. Thought processing is no longer possible. Difficult to remember your own name. Control of movement and coordination are lost.   The worst pain imaginable 10 At this level, most patients pass out from pain. When this level is reached, collapse of the autonomic nervous system occurs, leading to a sudden drop in blood pressure and heart rate. This in turn results in a temporary and dramatic drop in blood flow to the brain, leading to a loss of consciousness. Fainting is one of the body's self defense mechanisms. Passing out puts the brain in a calmed state and causes it to shut down for a while, in order to begin the healing process.    Summary: 1. Refer to this scale when providing [place] with your pain level. 2. Be accurate and careful when reporting your pain level. This will help with your care. 3. Over-reporting your pain level will lead to loss of credibility. 4. Even a level of 1/10 means that there is pain and will be treated at our facility. 5. High, inaccurate reporting will be documented as "Symptom  Exaggeration", leading to loss of credibility and suspicions of possible secondary gains such as obtaining more narcotics, or wanting to appear disabled, for fraudulent reasons. 6. Only pain levels of 5 or below will be seen at our facility. 7. Pain levels of 6 and above will be sent to the Emergency Department and the appointment cancelled. ____________________________________________________________________________________________   BMI Assessment: Estimated body mass index is 37.41 kg/m as calculated from the following:   Height as of this encounter: _0  (1.549 m).   Weight as of this encounter: 198 lb (89.8 kg).  BMI interpretation table: BMI level Category Range association with higher incidence of chronic pain  <18 kg/m2 Underweight   18.5-24.9 kg/m2 Ideal body weight   25-29.9 kg/m2 Overweight Increased incidence by 20%  30-34.9 kg/m2 Obese (Class I) Increased incidence by 68%  35-39.9 kg/m2 Severe obesity (Class II) Increased incidence  by 136%  >40 kg/m2 Extreme obesity (Class III) Increased incidence by 254%   BMI Readings from Last 4 Encounters:  10/15/17 37.41 kg/m  07/14/17 38.73 kg/m  06/05/17 37.36 kg/m  04/13/17 37.74 kg/m   Wt Readings from Last 4 Encounters:  10/15/17 198 lb (89.8 kg)  07/14/17 205 lb (93 kg)  06/05/17 201 lb (91.2 kg)  04/13/17 203 lb (92.1 kg)

## 2017-10-15 NOTE — Progress Notes (Signed)
Nursing Pain Medication Assessment:  Safety precautions to be maintained throughout the outpatient stay will include: orient to surroundings, keep bed in low position, maintain call bell within reach at all times, provide assistance with transfer out of bed and ambulation.  Medication Inspection Compliance: Pill count conducted under aseptic conditions, in front of the patient. Neither the pills nor the bottle was removed from the patient's sight at any time. Once count was completed pills were immediately returned to the patient in their original bottle.  Medication: Oxycodone IR Pill/Patch Count: 41 of 90 pills remain Pill/Patch Appearance: Markings consistent with prescribed medication Bottle Appearance: Standard pharmacy container. Clearly labeled. Filled Date: 01 / 27 / 2018 Last Medication intake:  Today

## 2017-10-19 MED FILL — DIGOXIN 250 MCG TABLET: 250 | 30 days supply | Qty: 30 | Fill #10

## 2017-10-30 MED FILL — METHOCARBAMOL 500 MG TABS: 500 | 90 days supply | Qty: 270 | Fill #0

## 2017-11-09 ENCOUNTER — Ambulatory Visit (INDEPENDENT_AMBULATORY_CARE_PROVIDER_SITE_OTHER): Payer: Medicare HMO | Admitting: Pharmacist

## 2017-11-09 DIAGNOSIS — I4891 Unspecified atrial fibrillation: Secondary | ICD-10-CM | POA: Diagnosis not present

## 2017-11-09 DIAGNOSIS — Z5181 Encounter for therapeutic drug level monitoring: Secondary | ICD-10-CM

## 2017-11-09 LAB — PROTIME-INR
INR: 5.7 — AB (ref 0.8–1.2)
Prothrombin Time: 60.8 s — ABNORMAL HIGH (ref 9.1–12.0)

## 2017-11-09 LAB — POCT INR: INR: 6.1

## 2017-11-09 NOTE — Patient Instructions (Signed)
Description   Don't take any Coumadin until we call you. Go to the ED with any bleeding.

## 2017-11-16 ENCOUNTER — Ambulatory Visit (INDEPENDENT_AMBULATORY_CARE_PROVIDER_SITE_OTHER): Payer: Medicare HMO | Admitting: *Deleted

## 2017-11-16 DIAGNOSIS — Z5181 Encounter for therapeutic drug level monitoring: Secondary | ICD-10-CM

## 2017-11-16 DIAGNOSIS — I4891 Unspecified atrial fibrillation: Secondary | ICD-10-CM

## 2017-11-16 DIAGNOSIS — Z952 Presence of prosthetic heart valve: Secondary | ICD-10-CM | POA: Diagnosis not present

## 2017-11-16 DIAGNOSIS — Z8673 Personal history of transient ischemic attack (TIA), and cerebral infarction without residual deficits: Secondary | ICD-10-CM

## 2017-11-16 DIAGNOSIS — Z954 Presence of other heart-valve replacement: Secondary | ICD-10-CM | POA: Diagnosis not present

## 2017-11-16 LAB — POCT INR: INR: 1.5

## 2017-11-16 NOTE — Patient Instructions (Signed)
Description   Today March 18th take 1 and 1/2 tablets (7.5mg ) then tomorrow March 19th take 2 tablets (10mg ) then continue same dose of coumadin 1 and 1/2 tablets  daily except 1 tablet on Mondays, Wednesdays, and Fridays. Recheck INR in 1 week.

## 2017-11-20 ENCOUNTER — Other Ambulatory Visit: Payer: Self-pay | Admitting: Cardiology

## 2017-11-23 ENCOUNTER — Ambulatory Visit (INDEPENDENT_AMBULATORY_CARE_PROVIDER_SITE_OTHER): Payer: Medicare HMO | Admitting: *Deleted

## 2017-11-23 ENCOUNTER — Other Ambulatory Visit: Payer: Self-pay

## 2017-11-23 DIAGNOSIS — I4891 Unspecified atrial fibrillation: Secondary | ICD-10-CM

## 2017-11-23 DIAGNOSIS — Z5181 Encounter for therapeutic drug level monitoring: Secondary | ICD-10-CM | POA: Diagnosis not present

## 2017-11-23 DIAGNOSIS — G4700 Insomnia, unspecified: Secondary | ICD-10-CM | POA: Insufficient documentation

## 2017-11-23 LAB — POCT INR: INR: 3.7

## 2017-11-23 MED ORDER — DIGOXIN 250 MCG PO TABS: 250.0000 ug | ORAL_TABLET | Freq: Every day | ORAL | 2 refills | Status: DC

## 2017-11-23 MED FILL — DIGOXIN 250 MCG TABLET: 250 | 30 days supply | Qty: 30 | Fill #0

## 2017-11-23 NOTE — Patient Instructions (Signed)
Description   Today only take 1/2 tablet, then start taking 1 tablet daily except 1.5 tablets on Sundays, Tuesdays and Thursdays.  Recheck INR in 10 days.

## 2017-11-24 ENCOUNTER — Other Ambulatory Visit: Payer: Self-pay | Admitting: Cardiology

## 2017-11-27 ENCOUNTER — Telehealth: Payer: Self-pay | Admitting: Cardiology

## 2017-11-27 NOTE — Telephone Encounter (Signed)
Received records from [facility] on 11/27/17, Appt 12/07/17 @ 9:20am w/ [name]. [place]

## 2017-12-03 ENCOUNTER — Ambulatory Visit (INDEPENDENT_AMBULATORY_CARE_PROVIDER_SITE_OTHER): Payer: Medicare HMO | Admitting: Pharmacist

## 2017-12-03 ENCOUNTER — Telehealth: Payer: Self-pay | Admitting: Pharmacist

## 2017-12-03 DIAGNOSIS — Z5181 Encounter for therapeutic drug level monitoring: Secondary | ICD-10-CM

## 2017-12-03 DIAGNOSIS — I4891 Unspecified atrial fibrillation: Secondary | ICD-10-CM

## 2017-12-03 DIAGNOSIS — Z7901 Long term (current) use of anticoagulants: Secondary | ICD-10-CM | POA: Diagnosis not present

## 2017-12-03 LAB — POCT INR: INR: 2.8

## 2017-12-03 NOTE — Telephone Encounter (Signed)
Returned call to [employer], PharmD with [facility]. He called to inquire if Mr. 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 Mr. Legan is seeing Dr. [name] on Monday. Since he has a complicated cardiovascular history Family medicine would like cards input before starting.   Will route to Dr. [name] for input/start at upcoming visit.

## 2017-12-03 NOTE — Patient Instructions (Signed)
Description   Continue 1 tablet daily except 1.5 tablets on Sundays, Tuesdays and Thursdays.  Recheck INR in 2 weeks.

## 2017-12-03 NOTE — Telephone Encounter (Signed)
New message  Lowella Grip from [facility] is requesting to speak with PharmD.  If Home Health RN is calling please get Coumadin Nurse on the phone STAT  1.  Are you calling in regards to an appointment? NO  2.  Are you calling for a refill ? NO  3.  Are you having bleeding issues? NO  4.  Do you need clearance to hold Coumadin? NO   Please route to the Coumadin Clinic Pool

## 2017-12-06 NOTE — Progress Notes (Signed)
Follow-up     Cardiology Office Note   Date:  12/07/2017   ID:  [facility], DOB [birth year], MRN [id number]  PCP:  Lance Dials, PA-C  Cardiologist:   Lance Breeding, MD    Chief Complaint  Patient presents with  . Edema      History of Present Illness: [facility] is a 57 y.o. male who presents for follow up of valvular heart disease.   He has a past medical history of AVR (s/p Bentall procedure in 1988), severe mitral regurgitation (s/p St Jude mechanical valve placed in 12/2014), CAD (s/p SVG-RCA, known to be occluded by cath in 12/2014), chronic atrial fibrillation, chronic diastolic CHF (EF 93-26% by echo in 12/2015), prior CVA, chronic venous insufficiency, OSA (on CPAP)and recurrent C. difficile infection.     Since I last saw him he has done well.  The patient denies any new symptoms such as chest discomfort, neck or arm discomfort. There has been no new shortness of breath, PND or orthopnea. There have been no reported palpitations, presyncope or syncope.  His weight is up but his leg swelling is down.     Past Medical History:  Diagnosis Date  . Anemia   . Anxiety   . ARDS (adult respiratory distress syndrome) ([facility]) 01/27/2015  . Arthritis   . Ascending aortic dissection ([facility]) 07/14/2008   Localized dissection of ascending aorta noted on CTA in 2009 and stable on CTA in 2011  . Asymptomatic chronic venous hypertension 01/15/2010   Overview:  Overview:  Qualifier: Diagnosis of  By: Lance Amen MD, [facility]   Last Assessment & Plan:  I suggested he go to a vein clinic to see if he could be qualified compression hose of some type, since he is not a surgical candidate according to the vascular surgeons.   . Atrial fibrillation ([place])    chronic persistent  . Bell's palsy   . CAD (coronary artery disease)    Old scar inferior wall myoview, 10/2009 EF 52%.  He did have previous SVG to RCA but no obstructive disease noted on his most recent cath.  SVG occluded.   . Cellulitis  04/02/2014  . Cerebral artery occlusion with cerebral infarction ([place]) 10/08/2011   Overview:  Overview:  And hx of TIA prior to CABG, all thought due to systemic emboli prior to coumadin   . Chronic diastolic heart failure ([facility]) 03/01/2015   a. 12/2015: echo showing a preserved EF of 65-70%, moderate AS, and moderate TR.   Marland Kitchen Chronic LBP 10/08/2011  . CVA (cerebral vascular accident) ([facility]) 10/08/2011   And hx of TIA prior to CABG, all thought due to systemic emboli prior to coumadin   . DIABETES MELLITUS, TYPE II 11/01/2009   Qualifier: Diagnosis of  By: Lance Amen MD, [facility]    . Diverticulosis   . 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 Encephalopathy acute 02/05/2015  . GERD (gastroesophageal reflux disease)    not needing medication at thhis time- 01/22/15  . Gout   . H/O mechanical aortic valve replacement 11/01/2009   Qualifier: Diagnosis of  By: [facility], [facility]    . Hiatal hernia   . History of colon polyps 12/23/2011   Overview:  Overview:  Colonoscopy January 2010, 6 mm rectal tubulovillous adenoma. No high-grade dysplasia   . Hyperlipidemia   . Hypertension   . Hypogonadism male 04/08/2012  .  Impaired glucose tolerance 10/08/2011  . Morbid obesity ([place]) 04/02/2014  . Myocardial infarction [facility]) age 63  . OSA (obstructive sleep apnea)    CPAP  . Peripheral vascular disease ([place])   . 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 5/00/9381   33 mm St Jude bileaflet mechanical valve placed via right mini thoracotomy approach  . 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    1988 - Dr Blase Mess at [facility]  . Severe mitral regurgitation 10/11/2014  . Testicular cancer [facility])    He was 57 y/o. He had surgical resection and rad tx's.   Marland Kitchen TIA (transient ischemic attack)    age 74    . Varicose veins     Past Surgical History:  Procedure Laterality Date  . aortic truck    . BENTALL PROCEDURE  1988   25 mm St Jude mechanical valve conduit - Dr Blase Mess at [facility] [place]  . CHOLECYSTECTOMY  2011  . COLONOSCOPY WITH PROPOFOL N/A 06/17/2016   Procedure: COLONOSCOPY WITH PROPOFOL;  Surgeon: Lance Bears, MD;  Location: [facility];  Service: Gastroenterology;  Laterality: N/A;  . CORONARY ARTERY BYPASS GRAFT    . LASER ABLATION Left 03/06/2010   leg  . MITRAL VALVE REPLACEMENT Right 01/24/2015   Procedure: Re-Operation, MINIMALLY INVASIVE MITRAL VALVE (MV) REPLACEMENT.;  Surgeon: Lance Alberts, MD;  Location: [facility];  Service: Open Heart Surgery;  Laterality: Right;  . ORCHIECTOMY  age 31   testicular cancer  . OTOPLASTY     bilateral, age 56  . TEE WITHOUT CARDIOVERSION N/A 10/11/2014   Procedure: TRANSESOPHAGEAL ECHOCARDIOGRAM (TEE);  Surgeon: Lance Casino, MD;  Location: [facility];  Service: Cardiovascular;  Laterality: N/A;  . TEE WITHOUT CARDIOVERSION N/A 01/24/2015   Procedure: TRANSESOPHAGEAL ECHOCARDIOGRAM (TEE);  Surgeon: Lance Alberts, MD;  Location: [place];  Service: Open Heart Surgery;  Laterality: N/A;  . TONSILLECTOMY  1967  . TOOTH EXTRACTION  2016     Current Outpatient Medications  Medication Sig Dispense Refill  . acetaminophen (TYLENOL) 500 MG tablet Take 500 mg by mouth every 8 (eight) hours as needed for moderate pain or headache.     . allopurinol (ZYLOPRIM) 300 MG tablet Take 300 mg by mouth daily.    Marland Kitchen atenolol (TENORMIN) 25 MG tablet Take 1 tablet (25 mg total) by mouth 2 (two) times daily.    . bacitracin 500 UNIT/GM ointment Apply 1 application topically as needed for wound care.     . digoxin (LANOXIN) 0.25 MG tablet Take 1 tablet (250 mcg total) by mouth daily. 90 tablet 2  . furosemide (LASIX) 80 MG tablet Take 1 tablet (80 mg total) by mouth 2 (two) times  daily. 180 tablet 3  . magnesium oxide (MAG-OX) 400 (241.3 MG) MG tablet Take 1 tablet (400 mg total) by mouth 2 (two) times daily. 180 tablet 3  . methocarbamol (ROBAXIN) 500 MG tablet Take 1 tablet (500 mg total) by mouth 3 (three) times daily. 270 tablet 0  . [START ON 12/26/2017] oxyCODONE (OXY IR/ROXICODONE) 5 MG immediate release tablet Take 1 tablet (5 mg total) by mouth every 8 (eight) hours as needed for moderate pain or severe pain. 90 tablet 0  . potassium chloride (K-DUR) 10 MEQ tablet Take 2 tablets (20 mEq total) by mouth 2 (two) times daily. 120 tablet  0  . silver sulfADIAZINE (SILVADENE) 1 % cream Apply 1 application topically daily.    . traMADol (ULTRAM) 50 MG tablet Take 50 mg by mouth daily.    . traZODone (DESYREL) 50 MG tablet Take 1 tablet by mouth at bedtime.    Marland Kitchen warfarin (COUMADIN) 5 MG tablet TAKE 1 TO 1 AND 1/2 TABLETS DAILY AS DIRECTED BY COUMADIN CLINIC 135 tablet 2   No current facility-administered medications for this visit.     Allergies:   Penicillins; Adhesive [tape]; and Bactrim [sulfamethoxazole-trimethoprim]   ROS:  Please see the history of present illness.   Otherwise, review of systems are positive for insomnia, hand tingling.   All other systems are reviewed and negative.    PHYSICAL EXAM: VS:  BP 124/72   Pulse 66   Ht 5' 1.5" (1.562 m)   Wt 217 lb (98.4 kg)   BMI 40.34 kg/m  , BMI Body mass index is 40.34 kg/m.  GENERAL:  Well appearing NECK:  No jugular venous distention, waveform within normal limits, carotid upstroke brisk and symmetric, no bruits, no thyromegaly LUNGS:  Clear to auscultation bilaterally CHEST:  Well healed sternotomy scar. HEART:  PMI not displaced or sustained,S1 mechanical and  S2 within normal limits, no S3, no clicks, no rubs, apical early systolic murmur radiating slightly at the aortic outflow tract, no diastolic murmurs, irregular ABD:  Flat, positive bowel sounds normal in frequency in pitch, no bruits, no  rebound, no guarding, no midline pulsatile mass, no hepatomegaly, no splenomegaly EXT:  2 plus pulses throughout, mild to moderate left greater than right calf edema edema, no cyanosis no clubbing     EKG:  EKG  ordered today. Atrial fibrillation, left bundle branch block, rate 66   Recent Labs: 01/16/2017: Magnesium 2.0 07/22/2017: BUN 18; Creatinine, Ser 0.76; Potassium 4.1; Sodium 138    Lipid Panel    Component Value Date/Time   CHOL 118 08/18/2012 1137   TRIG 133.0 08/18/2012 1137   HDL 27.20 (L) 08/18/2012 1137   CHOLHDL 4 08/18/2012 1137   VLDL 26.6 08/18/2012 1137   LDLCALC 64 08/18/2012 1137      Wt Readings from Last 3 Encounters:  12/07/17 217 lb (98.4 kg)  10/15/17 198 lb (89.8 kg)  07/14/17 205 lb (93 kg)      Other studies Reviewed: Additional studies/ records that were reviewed today include: Labs Review of the above records demonstrates:     ASSESSMENT AND PLAN:   MVR:  This has been stable with the last echo in 2017.  I do not suspect clinically that this is changed.  No further imaging is indicated.  AVR:   Again this was stable in 2017 on echo.  As above.   HTN:   The blood pressure is at target.   There was a question from his primary provider whether he could be on an ACE inhibitor or ARB.  I do not see a contraindication to this although we do have to watch his renal function as he does have some renal insufficiency in the past when he gets over diuresis.  At this point with his normal blood pressure and his diabetes well controlled and polypharmacy I have not added this.  I would defer to his primary provider if they feel that this is indicated.   EDEMA:    This seems to be at baseline.  No change in therapy.  ATRIAL FIB:     Tolerates anticoagulation.  He has rate control.  No change in therapy.  LEG ULCERS:   He was followed in the wound clinic.  These have healed.  We will continue with volume management.   Current medicines are reviewed  at length with the patient today.  The patient does not have concerns regarding medicines.  The following changes have been made:  None  Labs/ tests ordered today include:  None  Orders Placed This Encounter  Procedures  . EKG 12-Lead     Disposition:   FU with APP in 4 months.   Signed, Lance Breeding, MD  12/07/2017 7:42 PM    [facility]

## 2017-12-07 ENCOUNTER — Telehealth: Payer: Self-pay | Admitting: Cardiology

## 2017-12-07 ENCOUNTER — Ambulatory Visit (INDEPENDENT_AMBULATORY_CARE_PROVIDER_SITE_OTHER): Payer: Medicare HMO | Admitting: Cardiology

## 2017-12-07 ENCOUNTER — Encounter: Payer: Self-pay | Admitting: Cardiology

## 2017-12-07 VITALS — BP 124/72 | HR 66 | Ht 61.5 in | Wt 217.0 lb

## 2017-12-07 DIAGNOSIS — I482 Chronic atrial fibrillation, unspecified: Secondary | ICD-10-CM

## 2017-12-07 DIAGNOSIS — R609 Edema, unspecified: Secondary | ICD-10-CM | POA: Diagnosis not present

## 2017-12-07 DIAGNOSIS — Z952 Presence of prosthetic heart valve: Secondary | ICD-10-CM

## 2017-12-07 NOTE — Telephone Encounter (Signed)
Patient called stating that he saw Dr. [name] this am and complained of increased LE edema and weight gain over the weeken but has problems with lymphedema.  His diuretics were continued at same dose and he is on a sliding scale for his furosemide.  He has chronic back pain from spinal stenosis but tonight he started having more back pain and also has a low grade fever at 99.5.  He denies any chills or other body aches.  He wants to know if he is ok to take Tylenol.  I reassured him that he was fine to take the Tylenol.  I encouraged him to see his PCP in am if fever still present.  He agrees with the plan.

## 2017-12-07 NOTE — Patient Instructions (Signed)
Medication Instructions:  Continue current medications  If you need a refill on your cardiac medications before your next appointment, please call your pharmacy.  Labwork: None Ordered   Testing/Procedures: None Ordered  Follow-Up: Your physician wants you to follow-up in: 4 Months with Rhonda Barrett.     Thank you for choosing [facility] at [facility]!!

## 2017-12-09 NOTE — Telephone Encounter (Signed)
Georgina Peer - see Dr. Rosezella [place] note from [place] Monday.

## 2017-12-17 ENCOUNTER — Institutional Professional Consult (permissible substitution): Payer: Self-pay | Admitting: Internal Medicine

## 2017-12-17 ENCOUNTER — Ambulatory Visit (INDEPENDENT_AMBULATORY_CARE_PROVIDER_SITE_OTHER): Payer: Medicare HMO | Admitting: *Deleted

## 2017-12-17 ENCOUNTER — Telehealth: Payer: Self-pay | Admitting: Cardiology

## 2017-12-17 DIAGNOSIS — I4891 Unspecified atrial fibrillation: Secondary | ICD-10-CM | POA: Diagnosis not present

## 2017-12-17 DIAGNOSIS — Z5181 Encounter for therapeutic drug level monitoring: Secondary | ICD-10-CM

## 2017-12-17 DIAGNOSIS — Z7901 Long term (current) use of anticoagulants: Secondary | ICD-10-CM

## 2017-12-17 LAB — POCT INR: INR: 3.7

## 2017-12-17 NOTE — Telephone Encounter (Signed)
Absolutely.

## 2017-12-17 NOTE — Patient Instructions (Signed)
Description   Today take 1/2 tablet, then Continue 1 tablet daily except 1.5 tablets on Sundays, Tuesdays and Thursdays.  Recheck R in 2 weeks.

## 2017-12-17 NOTE — Telephone Encounter (Signed)
Spoke with patient and he stated that he had about a 2 pound weight gain overnight but a 5 pound weight gain over a week. He denies any shortness of breath just swelling in legs. Patient wanted to know if it was ok to take an extra Furosemide today, he is going out of town tomorrow. Advised ok to take and would forward to Dr [name] for review. Will call back only if further recommendations.

## 2017-12-17 NOTE — Telephone Encounter (Signed)
Pt c/o swelling: STAT is pt has developed SOB within 24 hours  1) How much weight have you gained and in what time span? 4 lbs overnight   2) If swelling, where is the swelling located? Legs   3) Are you currently taking a fluid pill? Yes   4) Are you currently SOB? No   5) Do you have a log of your daily weights (if so, list)?   6) Have you gained 3 pounds in a day or 5 pounds in a week? yes  7) Have you traveled recently? no

## 2017-12-28 MED FILL — DIGOXIN 250 MCG TABLET: 250 | 30 days supply | Qty: 30 | Fill #1

## 2017-12-31 ENCOUNTER — Other Ambulatory Visit: Payer: Medicare HMO

## 2017-12-31 ENCOUNTER — Telehealth: Payer: Self-pay | Admitting: Cardiology

## 2017-12-31 ENCOUNTER — Ambulatory Visit (INDEPENDENT_AMBULATORY_CARE_PROVIDER_SITE_OTHER): Payer: Medicare HMO | Admitting: *Deleted

## 2017-12-31 DIAGNOSIS — I4891 Unspecified atrial fibrillation: Secondary | ICD-10-CM | POA: Diagnosis not present

## 2017-12-31 DIAGNOSIS — Z5181 Encounter for therapeutic drug level monitoring: Secondary | ICD-10-CM

## 2017-12-31 LAB — POCT INR: INR: 2.7

## 2017-12-31 NOTE — Telephone Encounter (Signed)
OK to order BMET.

## 2017-12-31 NOTE — Telephone Encounter (Signed)
Verbal order given to [employer], RN at [address] office to place BMET order per Dr. [name].

## 2017-12-31 NOTE — Telephone Encounter (Signed)
New message    Patient calling to request lab order for BMET put in for today.

## 2017-12-31 NOTE — Patient Instructions (Addendum)
Description   Continue taking 1 tablet daily except 1.5 tablets on Sundays, Tuesdays and Thursdays.  Recheck INR 3 weeks.

## 2017-12-31 NOTE — Telephone Encounter (Signed)
Pt calling requesting order for BMET. States at last office visit he was giving the approval to calling in whenever he feels he need to have the labs drawn. Routing to Dr. [name] for approval.

## 2018-01-01 LAB — BASIC METABOLIC PANEL
BUN/Creatinine Ratio: 23 — ABNORMAL HIGH (ref 9–20)
BUN: 19 mg/dL (ref 6–24)
CALCIUM: 7.9 mg/dL — AB (ref 8.7–10.2)
CO2: 29 mmol/L (ref 20–29)
CREATININE: 0.82 mg/dL (ref 0.76–1.27)
Chloride: 99 mmol/L (ref 96–106)
GFR calc non Af Amer: 99 mL/min/{1.73_m2} (ref >59)
GFR, EST AFRICAN AMERICAN: 114 mL/min/{1.73_m2} (ref >59)
GLUCOSE: 81 mg/dL (ref 65–99)
Potassium: 4.1 mmol/L (ref 3.5–5.2)
Sodium: 139 mmol/L (ref 134–144)

## 2018-01-05 DIAGNOSIS — Z7901 Long term (current) use of anticoagulants: Secondary | ICD-10-CM | POA: Insufficient documentation

## 2018-01-12 ENCOUNTER — Encounter: Payer: Self-pay | Admitting: Nurse Practitioner

## 2018-01-12 ENCOUNTER — Ambulatory Visit: Payer: Medicare HMO | Attending: Nurse Practitioner | Admitting: Nurse Practitioner

## 2018-01-12 ENCOUNTER — Other Ambulatory Visit: Payer: Self-pay

## 2018-01-12 VITALS — BP 118/57 | HR 73 | Temp 98.0°F | Resp 18 | Ht 61.0 in | Wt 215.0 lb

## 2018-01-12 DIAGNOSIS — G8929 Other chronic pain: Secondary | ICD-10-CM | POA: Diagnosis not present

## 2018-01-12 DIAGNOSIS — Z79891 Long term (current) use of opiate analgesic: Secondary | ICD-10-CM | POA: Diagnosis not present

## 2018-01-12 DIAGNOSIS — I110 Hypertensive heart disease with heart failure: Secondary | ICD-10-CM | POA: Diagnosis not present

## 2018-01-12 DIAGNOSIS — D509 Iron deficiency anemia, unspecified: Secondary | ICD-10-CM | POA: Insufficient documentation

## 2018-01-12 DIAGNOSIS — G4700 Insomnia, unspecified: Secondary | ICD-10-CM | POA: Insufficient documentation

## 2018-01-12 DIAGNOSIS — Z86010 Personal history of colonic polyps: Secondary | ICD-10-CM | POA: Diagnosis not present

## 2018-01-12 DIAGNOSIS — E785 Hyperlipidemia, unspecified: Secondary | ICD-10-CM | POA: Insufficient documentation

## 2018-01-12 DIAGNOSIS — I482 Chronic atrial fibrillation: Secondary | ICD-10-CM | POA: Insufficient documentation

## 2018-01-12 DIAGNOSIS — G894 Chronic pain syndrome: Secondary | ICD-10-CM | POA: Diagnosis not present

## 2018-01-12 DIAGNOSIS — M48061 Spinal stenosis, lumbar region without neurogenic claudication: Secondary | ICD-10-CM | POA: Diagnosis not present

## 2018-01-12 DIAGNOSIS — E042 Nontoxic multinodular goiter: Secondary | ICD-10-CM | POA: Insufficient documentation

## 2018-01-12 DIAGNOSIS — Z9889 Other specified postprocedural states: Secondary | ICD-10-CM | POA: Diagnosis not present

## 2018-01-12 DIAGNOSIS — M5442 Lumbago with sciatica, left side: Secondary | ICD-10-CM

## 2018-01-12 DIAGNOSIS — J3801 Paralysis of vocal cords and larynx, unilateral: Secondary | ICD-10-CM | POA: Diagnosis not present

## 2018-01-12 DIAGNOSIS — Z882 Allergy status to sulfonamides status: Secondary | ICD-10-CM | POA: Insufficient documentation

## 2018-01-12 DIAGNOSIS — I252 Old myocardial infarction: Secondary | ICD-10-CM | POA: Insufficient documentation

## 2018-01-12 DIAGNOSIS — M5441 Lumbago with sciatica, right side: Secondary | ICD-10-CM

## 2018-01-12 DIAGNOSIS — M47816 Spondylosis without myelopathy or radiculopathy, lumbar region: Secondary | ICD-10-CM | POA: Diagnosis not present

## 2018-01-12 DIAGNOSIS — Z7901 Long term (current) use of anticoagulants: Secondary | ICD-10-CM | POA: Insufficient documentation

## 2018-01-12 DIAGNOSIS — M7918 Myalgia, other site: Secondary | ICD-10-CM | POA: Insufficient documentation

## 2018-01-12 DIAGNOSIS — M109 Gout, unspecified: Secondary | ICD-10-CM | POA: Diagnosis not present

## 2018-01-12 DIAGNOSIS — I5033 Acute on chronic diastolic (congestive) heart failure: Secondary | ICD-10-CM | POA: Diagnosis not present

## 2018-01-12 DIAGNOSIS — Z952 Presence of prosthetic heart valve: Secondary | ICD-10-CM | POA: Insufficient documentation

## 2018-01-12 DIAGNOSIS — Z9049 Acquired absence of other specified parts of digestive tract: Secondary | ICD-10-CM | POA: Insufficient documentation

## 2018-01-12 DIAGNOSIS — G510 Bell's palsy: Secondary | ICD-10-CM | POA: Insufficient documentation

## 2018-01-12 DIAGNOSIS — N529 Male erectile dysfunction, unspecified: Secondary | ICD-10-CM | POA: Diagnosis not present

## 2018-01-12 DIAGNOSIS — I340 Nonrheumatic mitral (valve) insufficiency: Secondary | ICD-10-CM | POA: Insufficient documentation

## 2018-01-12 DIAGNOSIS — I2510 Atherosclerotic heart disease of native coronary artery without angina pectoris: Secondary | ICD-10-CM | POA: Insufficient documentation

## 2018-01-12 DIAGNOSIS — E119 Type 2 diabetes mellitus without complications: Secondary | ICD-10-CM | POA: Diagnosis not present

## 2018-01-12 DIAGNOSIS — G4733 Obstructive sleep apnea (adult) (pediatric): Secondary | ICD-10-CM | POA: Insufficient documentation

## 2018-01-12 DIAGNOSIS — Z880 Allergy status to penicillin: Secondary | ICD-10-CM | POA: Insufficient documentation

## 2018-01-12 DIAGNOSIS — Z888 Allergy status to other drugs, medicaments and biological substances status: Secondary | ICD-10-CM | POA: Insufficient documentation

## 2018-01-12 DIAGNOSIS — Z79899 Other long term (current) drug therapy: Secondary | ICD-10-CM | POA: Diagnosis not present

## 2018-01-12 DIAGNOSIS — J80 Acute respiratory distress syndrome: Secondary | ICD-10-CM | POA: Insufficient documentation

## 2018-01-12 DIAGNOSIS — F419 Anxiety disorder, unspecified: Secondary | ICD-10-CM | POA: Insufficient documentation

## 2018-01-12 DIAGNOSIS — I872 Venous insufficiency (chronic) (peripheral): Secondary | ICD-10-CM | POA: Insufficient documentation

## 2018-01-12 DIAGNOSIS — I219 Acute myocardial infarction, unspecified: Secondary | ICD-10-CM | POA: Diagnosis not present

## 2018-01-12 MED ORDER — METHOCARBAMOL 500 MG PO TABS: 500.0000 mg | ORAL_TABLET | Freq: Three times a day (TID) | ORAL | 0 refills | Status: DC

## 2018-01-12 MED ORDER — OXYCODONE HCL 5 MG PO TABS: 5.0000 mg | ORAL_TABLET | Freq: Three times a day (TID) | ORAL | 0 refills | Status: DC | PRN

## 2018-01-12 NOTE — Progress Notes (Signed)
Nursing Pain Medication Assessment:  Safety precautions to be maintained throughout the outpatient stay will include: orient to surroundings, keep bed in low position, maintain call bell within reach at all times, provide assistance with transfer out of bed and ambulation.  Medication Inspection Compliance: Pill count conducted under aseptic conditions, in front of the patient. Neither the pills nor the bottle was removed from the patient's sight at any time. Once count was completed pills were immediately returned to the patient in their original bottle.  Medication: oxycodone 5mg  Pill/Patch Count: 55 of 90 pills remain Pill/Patch Appearance: Markings consistent with prescribed medication Bottle Appearance: Standard pharmacy container. Clearly labeled. Filled Date: 5 / 02 / 2019 Last Medication intake:  Today

## 2018-01-12 NOTE — Progress Notes (Signed)
Patient's Name: Lance Cook  MRN: [id number]  Referring Provider: [name], PA-C  DOB: 1960-10-17  PCP: Selinda Orion  DOS: 01/12/2018  Note by: Vevelyn Francois NP  Service setting: Ambulatory outpatient  Specialty: Interventional Pain Management  Location: [facility] (AMB) Pain Management Facility    Patient type: Established    Primary Reason(s) for Visit: Encounter for prescription drug management. (Level of risk: moderate)  CC: Back Pain (lower) and Pain (entire body)  HPI  Mr. Curci is a 57 y.o. year old, male patient, who comes today for a medication management evaluation. 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; Ascending aortic dissection ([place]); Chronic diastolic congestive heart failure (HCC); 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; Enteritis due to Clostridium difficile; Hypomagnesemia; Physical deconditioning; Long term current use of opiate analgesic; Long term prescription opiate use; Opiate use (22.5 MME/day); Opiate dependence ([place]); Encounter for therapeutic drug level monitoring; Chronic low back pain (midline); Lumbar facet syndrome; Personal history of transient ischemic attack (TIA), and cerebral infarction without residual deficits; ED (erectile dysfunction) of organic origin; Eunuchoidism; Adult BMI 30+; Obstructive apnea; Testicular hypofunction; Lumbar canal stenosis; Malignant  neoplasm of testis ([place]); Chronic lower extremity pain (Left); Personal history of malignant neoplasm of testis; Musculoskeletal pain; History of colonic polyps; Chronic diarrhea; Benign neoplasm of transverse colon; Scrotal swelling; Chronic pain syndrome; Acute on chronic diastolic CHF (congestive heart failure) (HCC); S/P MVR (mitral valve replacement); H/O TIA (transient ischemic attack) and stroke; Heart valve replaced by other means; HTN (hypertension); Low back pain; Lymphadenopathy; Male hypogonadism; Obesity (BMI 30-39.9); Obstructive sleep apnea; Other testicular hypofunction; Presence of other heart-valve replacement; S/P AVR (aortic valve replacement); Spinal stenosis of lumbar region without neurogenic claudication; Spondylosis; Type 2 diabetes mellitus with other specified complication ([place]); Type II or unspecified type diabetes mellitus with other specified manifestations, not stated as uncontrolled; Leg swelling; Unilateral vocal cord paralysis; Insomnia; 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 His primarily concern today is the Back Pain (lower) and Pain (entire body)  Pain Assessment: Location: Lower Back(body) Radiating: sick, has cold, virus, dehydrated Onset: More than a month ago Quality: Aching, Constant, Squeezing(hurting in kdiney area) Severity:  /10 (subjective, self-reported pain score)  Note: Reported level is compatible with observation.                          Effect on ADL: walking, breathing, moving Timing: Constant Modifying factors: medications, muscle relaxers  BP: (!) 118/57  HR: 73  Mr. Locker was last scheduled for an appointment on 10/15/2017 for medication management. During today's appointment we reviewed Mr. Cullom's chronic pain status, as well as his outpatient medication regimen. He admits that his back pain is status quo. He is having respiratory issues. He  denies any problems with constipation.   The patient  reports that he does not use drugs. His body mass index is 40.62 kg/m.  Further details on both, my assessment(s), as well as the proposed treatment  plan, please see below.  Controlled Substance Pharmacotherapy Assessment REMS (Risk Evaluation and Mitigation Strategy)  Analgesic:Oxycodone IR 53m q 8hrs (15 mg/day of oxycodone) MME/day:22.5 mg/day   GIgnatius Specking RN  01/12/2018  9:55 AM  Sign at close encounter Nursing Pain Medication Assessment:  Safety precautions to be maintained throughout the outpatient stay will include: orient to surroundings, keep bed in low position, maintain call bell within reach at all times, provide assistance with transfer out of bed and ambulation.  Medication Inspection Compliance: Pill count conducted under aseptic conditions, in front of the patient. Neither the pills nor the bottle was removed from the patient's sight at any time. Once count was completed pills were immediately returned to the patient in their original bottle.  Medication: oxycodone 571mPill/Patch Count: 55 of 90 pills remain Pill/Patch Appearance: Markings consistent with prescribed medication Bottle Appearance: Standard pharmacy container. Clearly labeled. Filled Date: 5 / 02 / 2019 Last Medication intake:  Today   Pharmacokinetics: Liberation and absorption (onset of action): WNL Distribution (time to peak effect): WNL Metabolism and excretion (duration of action): WNL         Pharmacodynamics: Desired effects: Analgesia: Mr. CoKronbergeports >50% benefit. Functional ability: Patient reports that medication allows him to accomplish basic ADLs Clinically meaningful improvement in function (CMIF): Sustained CMIF goals met Perceived effectiveness: Described as relatively effective, allowing for increase in activities of daily living (ADL) Undesirable effects: Side-effects or Adverse reactions: None reported Monitoring: [place]  PMP: Online review of the past 1255-monthriod conducted. Compliant with practice rules and regulations Last UDS on record: Summary  Date Value Ref Range Status  07/14/2017 FINAL  Final    Comment:    ==================================================================== TOXASSURE SELECT 13 (MW) ==================================================================== Test                             Result       Flag       Units Drug Present and Declared for Prescription Verification   Temazepam                      250          EXPECTED   ng/mg creat    Temazepam may be administered as a scheduled prescription    medication; it is also an expected metabolite of diazepam.   Oxycodone                      686          EXPECTED   ng/mg creat   Oxymorphone                    236          EXPECTED   ng/mg creat   Noroxycodone                   1214         EXPECTED   ng/mg creat    Sources of oxycodone include scheduled prescription medications.    Oxymorphone and noroxycodone are expected metabolites of    oxycodone. Oxymorphone is also available as a scheduled    prescription medication. ==================================================================== Test                      Result    Flag   Units      Ref Range   Creatinine  28               mg/dL      >=20 ==================================================================== Declared Medications:  The flagging and interpretation on this report are based on the  following declared medications.  Unexpected results may arise from  inaccuracies in the declared medications.  **Note: The testing scope of this panel includes these medications:  Oxycodone  Temazepam  **Note: The testing scope of this panel does not include following  reported medications:  Acetaminophen  Allopurinol  Atenolol  Digoxin  Furosemide  Magnesium Oxide  Methocarbamol  Potassium  Silver  Topical   Warfarin ==================================================================== For clinical consultation, please call [phone]. ====================================================================    UDS interpretation: Compliant          Medication Assessment Form: Reviewed. Patient indicates being compliant with therapy Treatment compliance: Compliant Risk Assessment Profile: Aberrant behavior: See prior evaluations. None observed or detected today Comorbid factors increasing risk of overdose: See prior notes. No additional risks detected today Risk of substance use disorder (SUD): Low Opioid Risk Tool - 01/12/18 0950      Personal History of Substance Abuse   Alcohol  Negative    Illegal Drugs  Negative    Rx Drugs  Negative      Age   Age between 35-45 years   No      Psychological Disease   Psychological Disease  Negative    Depression  Negative      Total Score   Opioid Risk Tool Scoring  0    Opioid Risk Interpretation  Low Risk      ORT Scoring interpretation table:  Score <3 = Low Risk for SUD  Score between 4-7 = Moderate Risk for SUD  Score >8 = High Risk for Opioid Abuse   Risk Mitigation Strategies:  Patient Counseling: Covered Patient-Prescriber Agreement (PPA): Present and active  Notification to other healthcare providers: Done  Pharmacologic Plan: No change in therapy, at this time.             Laboratory Chemistry  Inflammation Markers (CRP: Acute Phase) (ESR: Chronic Phase) Lab Results  Component Value Date   CRP 0.9 10/09/2015   ESRSEDRATE 9 01/14/2016   LATICACIDVEN 1.59 02/05/2016                         Rheumatology Markers Lab Results  Component Value Date   LABURIC 12.1 (H) 11/01/2009                        Renal Function Markers Lab Results  Component Value Date   BUN 19 12/31/2017   CREATININE 0.82 12/31/2017   GFRAA 114 12/31/2017   GFRNONAA 99 12/31/2017                              Hepatic Function Markers Lab  Results  Component Value Date   AST 21 02/05/2016   ALT 19 02/05/2016   ALBUMIN 2.6 (L) 02/05/2016   ALKPHOS 55 02/05/2016                        Electrolytes Lab Results  Component Value Date   NA 139 12/31/2017   K 4.1 12/31/2017   CL 99 12/31/2017   CALCIUM 7.9 (L) 12/31/2017   MG 2.0 01/16/2017   PHOS 3.5 02/09/2015  Neuropathy Markers Lab Results  Component Value Date   VITAMINB12 371 01/07/2012   FOLATE 15.1 01/07/2012   HGBA1C 5.9 (H) 01/22/2015                        Bone Pathology Markers Lab Results  Component Value Date   TESTOFREE 49.5 03/22/2012   TESTOSTERONE 206.14 (L) 08/18/2012                         Coagulation Parameters Lab Results  Component Value Date   INR 2.7 12/31/2017   LABPROT 60.8 (H) 11/09/2017   APTT 41 (H) 01/24/2015   PLT 127 (L) 09/03/2016                        Cardiovascular Markers Lab Results  Component Value Date   BNP 260.5 (H) 08/27/2016   CKTOTAL 87 10/28/2009   CKMB 3.1 10/28/2009   TROPONINI 0.02        NO INDICATION OF MYOCARDIAL INJURY. 10/28/2009   HGB 10.9 (L) 09/05/2016   HCT 32.0 (L) 09/05/2016                         CA Markers No results found for: CEA, CA125, LABCA2                      Note: Lab results reviewed.  Recent Diagnostic Imaging Results  CT SOFT TISSUE NECK W CONTRAST CLINICAL DATA:  Left vocal cord paralysis. Prior left parotid resection.  EXAM: CT NECK WITH CONTRAST  TECHNIQUE: Multidetector CT imaging of the neck was performed using the standard protocol following the bolus administration of intravenous contrast.  CONTRAST:  52m ISOVUE-300 IOPAMIDOL (ISOVUE-300) INJECTION 61%  COMPARISON:  08/18/2012 neck CT.  11/21/2014 chest CTA.  FINDINGS: Pharynx and larynx: No pharyngeal mass or swelling. There is motion artifact through the larynx which limits assessment. There is suggestion of asymmetric medial ization of the left vocal cord with some  thickening of the left AE folds which would be compatible with the provided history of left vocal cord paralysis. No gross laryngeal mass is identified.  Salivary glands: Submandibular glands and right parotid gland are unremarkable. Sequelae of interval left parotidectomy are identified with resection of the lipomatous mass extending inferiorly superficial to the sternocleidomastoid muscle on the prior study.  Thyroid: Mild diffuse enlargement, nodularity, and heterogeneity of the thyroid without dominant mass identified.  Lymph nodes: There is an enlarged 11 mm short axis left supraclavicular lymph node which has increased in size from 2013 (series 3, image 76, previously 6 mm). Additional subcentimeter left supraclavicular and left thoracic inlet lymph nodes have also mildly enlarged. Right paratracheal lymph nodes near the thoracic inlet have mildly enlarged and measure up to 11 mm in short axis (series 3, image 81).  Vascular: Major vascular structures of the neck are patent.  Limited intracranial: Unremarkable.  Visualized orbits: Not imaged.  Mastoids and visualized paranasal sinuses: Unchanged mucous retention cyst or polyp inferiorly in the right maxillary sinus. Minimal left maxillary sinus mucosal thickening. Clear mastoid air cells.  Skeleton: Large anterior vertebral osteophytes C4 to T1. Bilateral facet ankylosis at C2-3 and C7-T1. Partial ankylosis across the C2-3 disc space as well.  Upper chest: Numerous subcentimeter short axis peritracheal, prevascular, and precarinal lymph nodes are similar to the 2016 chest CTA. Chronic short-segment dissection in the proximal  ascending aorta was incompletely imaged and is also partially obscured by motion artifact. Small pleural effusions are partially visualized. Unchanged small left upper lobe lung nodules from 2016, the largest of which measures 3 mm and is calcified.  Other: None.  IMPRESSION: 1. Mild left  supraclavicular and bilateral thoracic inlet lymphadenopathy, increased from 2013 and of indeterminate etiology. 2. Numerous subcentimeter mediastinal lymph nodes, unchanged from 2016. 3. No primary neck mass. 4. Limited assessment of the larynx due to motion. 5. Mildly enlarged and diffusely nodular thyroid likely reflecting goiter.  Electronically Signed   By: Logan Bores M.D.   On: 10/06/2016 15:21  Complexity Note: Imaging results reviewed. Results shared with Mr. Boozer, using Layman's terms.                         Meds   Current Outpatient Medications:  .  acetaminophen (TYLENOL) 500 MG tablet, Take 500 mg by mouth every 8 (eight) hours as needed for moderate pain or headache. , Disp: , Rfl:  .  allopurinol (ZYLOPRIM) 300 MG tablet, Take 300 mg by mouth daily., Disp: , Rfl:  .  atenolol (TENORMIN) 25 MG tablet, Take 1 tablet (25 mg total) by mouth 2 (two) times daily., Disp: , Rfl:  .  bacitracin 500 UNIT/GM ointment, Apply 1 application topically as needed for wound care. , Disp: , Rfl:  .  digoxin (LANOXIN) 0.25 MG tablet, Take 1 tablet (250 mcg total) by mouth daily., Disp: 90 tablet, Rfl: 2 .  furosemide (LASIX) 80 MG tablet, Take 1 tablet (80 mg total) by mouth 2 (two) times daily., Disp: 180 tablet, Rfl: 3 .  magnesium oxide (MAG-OX) 400 (241.3 MG) MG tablet, Take 1 tablet (400 mg total) by mouth 2 (two) times daily., Disp: 180 tablet, Rfl: 3 .  methocarbamol (ROBAXIN) 500 MG tablet, Take 1 tablet (500 mg total) by mouth 3 (three) times daily., Disp: 270 tablet, Rfl: 0 .  oxyCODONE (OXY IR/ROXICODONE) 5 MG immediate release tablet, Take 1 tablet (5 mg total) by mouth every 8 (eight) hours as needed for moderate pain or severe pain., Disp: 90 tablet, Rfl: 0 .  potassium chloride (K-DUR) 10 MEQ tablet, Take 2 tablets (20 mEq total) by mouth 2 (two) times daily., Disp: 120 tablet, Rfl: 0 .  silver sulfADIAZINE (SILVADENE) 1 % cream, Apply 1 application topically daily., Disp: ,  Rfl:  .  traZODone (DESYREL) 50 MG tablet, Take 2 tablets by mouth at bedtime. , Disp: , Rfl:  .  trimethoprim-polymyxin b (POLYTRIM) ophthalmic solution, INT 1 GTT IN OS QID, Disp: , Rfl: 0 .  warfarin (COUMADIN) 5 MG tablet, TAKE 1 TO 1 AND 1/2 TABLETS DAILY AS DIRECTED BY COUMADIN CLINIC, Disp: 135 tablet, Rfl: 2  ROS  Constitutional: Denies any fever or chills Gastrointestinal: No reported hemesis, hematochezia, vomiting, or acute GI distress Musculoskeletal: Denies any acute onset joint swelling, redness, loss of ROM, or weakness Neurological: No reported episodes of acute onset apraxia, aphasia, dysarthria, agnosia, amnesia, paralysis, loss of coordination, or loss of consciousness  Allergies  Mr. Karel is allergic to penicillins; adhesive [tape]; and bactrim [sulfamethoxazole-trimethoprim].  [place]  Drug: Mr. Malerba  reports that he does not use drugs. Alcohol:  reports that he drinks alcohol. Tobacco:  reports that he has been smoking cigars.  He has never used smokeless tobacco. Medical:  has a past medical history of Anemia, Anxiety, ARDS (adult respiratory distress syndrome) ([place]) (01/27/2015), Arthritis, Ascending aortic dissection (  [place]) (07/14/2008), Asymptomatic chronic venous hypertension (01/15/2010), Atrial fibrillation (HCC), Bell's palsy, CAD (coronary artery disease), Cellulitis (04/02/2014), Cerebral artery occlusion with cerebral infarction ([place]) (10/08/2011), Chronic diastolic heart failure ([place]) (03/01/2015), Chronic LBP (10/08/2011), CVA (cerebral vascular accident) ([place]) (10/08/2011), DIABETES MELLITUS, TYPE II (11/01/2009), Diverticulosis, 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), Encephalopathy acute (02/05/2015), GERD (gastroesophageal reflux disease), Gout, H/O mechanical aortic valve replacement (11/01/2009), Hiatal hernia, History of colon polyps (12/23/2011), Hyperlipidemia, Hypertension, Hypogonadism male (04/08/2012), Impaired glucose tolerance (10/08/2011), Morbid obesity  ([place]) (04/02/2014), Myocardial infarction [facility]) (age 78), OSA (obstructive sleep apnea), Peripheral vascular disease ([place]), 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 Surgical: Mr. Clouatre  has a past surgical history that includes Laser ablation (Left, 03/06/2010); Cardiac valve replacement; Cholecystectomy (2011); Tonsillectomy (1967); Otoplasty; Orchiectomy (age 69); TEE without cardioversion (N/A, 10/11/2014); Bentall procedure (1988); Coronary artery bypass graft; aortic truck; TEE without cardioversion (N/A, 01/24/2015); Mitral valve replacement (Right, 01/24/2015); Tooth extraction (2016); and Colonoscopy with propofol (N/A, 06/17/2016). Family: family history includes Cancer in his maternal aunt and maternal uncle; Diabetes in his father; Heart disease in his mother; Hypertension in his other; Other in his mother; Stroke in his other; Throat cancer in his mother.  Constitutional Exam  General appearance: Well nourished, well developed, and well hydrated. In no apparent acute distress Vitals:   01/12/18 0938  BP: (!) 118/57  Pulse: 73  Resp: 18  Temp: 98 F (36.7 C)  SpO2: 95%  Weight: 215 lb (97.5 kg)  Height: 5' 1"  (1.549 m)  Psych/Mental status: Alert, oriented x 3 (person, place, & time)       Eyes: PERLA Respiratory: No evidence of acute respiratory distress   Lumbar Spine Area Exam  Skin & Axial Inspection: No masses, redness, or swelling Alignment: Symmetrical Functional ROM: Unrestricted ROM       Stability: No instability detected Muscle Tone/Strength: Functionally intact. No obvious neuro-muscular anomalies detected. Sensory (Neurological): Unimpaired Palpation: No palpable anomalies       Provocative Tests: Lumbar Hyperextension and rotation test:  evaluation deferred today       Lumbar Lateral bending test: evaluation deferred today       Patrick's Maneuver: evaluation deferred today                    Gait & Posture Assessment  Ambulation: Unassisted Gait: Relatively normal for age and body habitus Posture: WNL   Lower Extremity Exam    Side: Right lower extremity  Side: Left lower extremity  Stability: No instability observed          Stability: No instability observed          Skin & Extremity Inspection: Skin color, temperature, and hair growth are WNL. No peripheral edema or cyanosis. No masses, redness, swelling, asymmetry, or associated skin lesions. No contractures.  Skin & Extremity Inspection: Skin color, temperature, and hair growth are WNL. No peripheral edema or cyanosis. No masses, redness, swelling, asymmetry, or associated skin lesions. No contractures.  Functional ROM: Unrestricted ROM                  Functional ROM: Unrestricted ROM                  Muscle Tone/Strength: Functionally intact. No obvious neuro-muscular anomalies detected.  Muscle Tone/Strength: Functionally intact. No obvious  neuro-muscular anomalies detected.  Sensory (Neurological): Unimpaired  Sensory (Neurological): Unimpaired  Palpation: No palpable anomalies  Palpation: No palpable anomalies   Assessment  Primary Diagnosis & Pertinent Problem List: The primary encounter diagnosis was Spinal stenosis of lumbar region, unspecified whether neurogenic claudication present. Diagnoses of Lumbar facet syndrome, Chronic low back pain (midline), Musculoskeletal pain, and Chronic pain syndrome were also pertinent to this visit.  Status Diagnosis  Stable Stable Controlled 1. Spinal stenosis of lumbar region, unspecified whether neurogenic claudication present   2. Lumbar facet syndrome   3. Chronic low back pain (midline)   4. Musculoskeletal pain   5. Chronic pain syndrome     Problems updated and reviewed during this visit: Problem  Chronic  Anticoagulation  Insomnia  Varicose Veins of Lower Extremity With Inflammation  Venous (Peripheral) Insufficiency   Plan of Care  Pharmacotherapy (Medications Ordered): No orders of the defined types were placed in this encounter.  New Prescriptions   No medications on file   Medications administered today: Keelen Quevedo had no medications administered during this visit. Lab-work, procedure(s), and/or referral(s): No orders of the defined types were placed in this encounter.  Imaging and/or referral(s): None  Interventional therapies: Planned, scheduled, and/or pending:  Not at this time.   Considering:  Bilateral lumbar facet RFA.    Palliative PRN treatment(s):  Not at this time   Provider-requested follow-up: Return in about 3 months (around 04/14/2018) for MedMgmt with Me Donella Stade Edison Pace).  Future Appointments  Date Time Provider [place]  01/21/2018  1:45 PM [facility]  02/25/2018  9:30 AM Deneise Lever, MD [place] None   Primary Care Physician: Selinda Orion Location: [facility] Outpatient Pain Management Facility Note by: Vevelyn Francois NP Date: 01/12/2018; Time: 10:17 AM  Pain Score Disclaimer: We use the NRS-11 scale. This is a self-reported, subjective measurement of pain severity with only modest accuracy. It is used primarily to identify changes within a particular patient. It must be understood that outpatient pain scales are significantly less accurate that those used for research, where they can be applied under ideal controlled circumstances with minimal exposure to variables. In reality, the score is likely to be a combination of pain intensity and pain affect, where pain affect describes the degree of emotional arousal or changes in action readiness caused by the sensory experience of pain. Factors such as social and work situation, setting, emotional state, anxiety levels, expectation, and prior  pain experience may influence pain perception and show large inter-individual differences that may also be affected by time variables.  Patient instructions provided during this appointment: Patient Instructions   ____________________________________________________________________________________________  Medication Rules  Applies to: All patients receiving prescriptions (written or electronic).  Pharmacy of record: Pharmacy where electronic prescriptions will be sent. If written prescriptions are taken to a different pharmacy, please inform the nursing staff. The pharmacy listed in the electronic medical record should be the one where you would like electronic prescriptions to be sent.  Prescription refills: Only during scheduled appointments. Applies to both, written and electronic prescriptions.  NOTE: The following applies primarily to controlled substances (Opioid* Pain Medications).   Patient's responsibilities: 1. Pain Pills: Bring all pain pills to every appointment (except for procedure appointments). 2. Pill Bottles: Bring pills in original pharmacy bottle. Always bring newest bottle. Bring bottle, even if empty. 3. Medication refills: You are responsible for knowing and keeping track of what medications you need refilled. The day before your appointment, write  a list of all prescriptions that need to be refilled. Bring that list to your appointment and give it to the admitting nurse. Prescriptions will be written only during appointments. If you forget a medication, it will not be "Called in", "Faxed", or "electronically sent". You will need to get another appointment to get these prescribed. 4. Prescription Accuracy: You are responsible for carefully inspecting your prescriptions before leaving our office. Have the discharge nurse carefully go over each prescription with you, before taking them home. Make sure that your name is accurately spelled, that your address is correct. Check  the name and dose of your medication to make sure it is accurate. Check the number of pills, and the written instructions to make sure they are clear and accurate. Make sure that you are given enough medication to last until your next medication refill appointment. 5. Taking Medication: Take medication as prescribed. Never take more pills than instructed. Never take medication more frequently than prescribed. Taking less pills or less frequently is permitted and encouraged, when it comes to controlled substances (written prescriptions).  6. Inform other Doctors: Always inform, all of your healthcare providers, of all the medications you take. 7. Pain Medication from other Providers: You are not allowed to accept any additional pain medication from any other Doctor or Healthcare provider. There are two exceptions to this rule. (see below) In the event that you require additional pain medication, you are responsible for notifying us, as stated below. 8. Medication Agreement: You are responsible for carefully reading and following our Medication Agreement. This must be signed before receiving any prescriptions from our practice. Safely store a copy of your signed Agreement. Violations to the Agreement will result in no further prescriptions. (Additional copies of our Medication Agreement are available upon request.) 9. Laws, Rules, & Regulations: All patients are expected to follow all Federal and [employer], [employer], Rules, [employer]. Ignorance of the Laws does not constitute a valid excuse. The use of any illegal substances is prohibited. 10. Adopted CDC guidelines & recommendations: Target dosing levels will be at or below 60 MME/day. Use of benzodiazepines** is not recommended.  Exceptions: There are only two exceptions to the rule of not receiving pain medications from other Healthcare Providers. 1. Exception #1 (Emergencies): In the event of an emergency (i.e.: accident requiring emergency care),  you are allowed to receive additional pain medication. However, you are responsible for: As soon as you are able, call our office [phone], at any time of the day or night, and leave a message stating your name, the date and nature of the emergency, and the name and dose of the medication prescribed. In the event that your call is answered by a member of our staff, make sure to document and save the date, time, and the name of the person that took your information.  2. Exception #2 (Planned Surgery): In the event that you are scheduled by another doctor or dentist to have any type of surgery or procedure, you are allowed (for a period no longer than 30 days), to receive additional pain medication, for the acute post-op pain. However, in this case, you are responsible for picking up a copy of our "Post-op Pain Management for Surgeons" handout, and giving it to your surgeon or dentist. This document is available at our office, and does not require an appointment to obtain it. Simply go to our office during business hours (Monday-Thursday from 8:00 AM to 4:00 PM) (Friday 8:00 AM to 12:00  Noon) or if you have a scheduled appointment with [place], prior to your surgery, and ask for it by name. In addition, you will need to provide [place] with your name, name of your surgeon, type of surgery, and date of procedure or surgery.  *Opioid medications include: morphine, codeine, oxycodone, oxymorphone, hydrocodone, hydromorphone, meperidine, tramadol, tapentadol, buprenorphine, fentanyl, methadone. **Benzodiazepine medications include: diazepam (Valium), alprazolam (Xanax), clonazepam (Klonopine), lorazepam (Ativan), clorazepate (Tranxene), chlordiazepoxide (Librium), estazolam (Prosom), oxazepam (Serax), temazepam (Restoril), triazolam (Halcion) (Last updated: 10/29/2017) ____________________________________________________________________________________________   BMI Assessment: Estimated body mass index is 40.62  kg/m as calculated from the following:   Height as of this encounter: 5' 1"  (1.549 m).   Weight as of this encounter: 215 lb (97.5 kg).  BMI interpretation table: BMI level Category Range association with higher incidence of chronic pain  <18 kg/m2 Underweight   18.5-24.9 kg/m2 Ideal body weight   25-29.9 kg/m2 Overweight Increased incidence by 20%  30-34.9 kg/m2 Obese (Class I) Increased incidence by 68%  35-39.9 kg/m2 Severe obesity (Class II) Increased incidence by 136%  >40 kg/m2 Extreme obesity (Class III) Increased incidence by 254%   Patient's current BMI Ideal Body weight  Body mass index is 40.62 kg/m. Ideal body weight: 52.3 kg (115 lb 4.8 oz) Adjusted ideal body weight: 70.4 kg (155 lb 2.9 oz)   BMI Readings from Last 4 Encounters:  01/12/18 40.62 kg/m  12/07/17 40.34 kg/m  10/15/17 37.41 kg/m  07/14/17 38.73 kg/m   Wt Readings from Last 4 Encounters:  01/12/18 215 lb (97.5 kg)  12/07/17 217 lb (98.4 kg)  10/15/17 198 lb (89.8 kg)  07/14/17 205 lb (93 kg)

## 2018-01-12 NOTE — Patient Instructions (Addendum)
____________________________________________________________________________________________  Medication Rules  Applies to: All patients receiving prescriptions (written or electronic).  Pharmacy of record: Pharmacy where electronic prescriptions will be sent. If written prescriptions are taken to a different pharmacy, please inform the nursing staff. The pharmacy listed in the electronic medical record should be the one where you would like electronic prescriptions to be sent.  Prescription refills: Only during scheduled appointments. Applies to both, written and electronic prescriptions.  NOTE: The following applies primarily to controlled substances (Opioid* Pain Medications).   Patient's responsibilities: 1. Pain Pills: Bring all pain pills to every appointment (except for procedure appointments). 2. Pill Bottles: Bring pills in original pharmacy bottle. Always bring newest bottle. Bring bottle, even if empty. 3. Medication refills: You are responsible for knowing and keeping track of what medications you need refilled. The day before your appointment, write a list of all prescriptions that need to be refilled. Bring that list to your appointment and give it to the admitting nurse. Prescriptions will be written only during appointments. If you forget a medication, it will not be "Called in", "Faxed", or "electronically sent". You will need to get another appointment to get these prescribed. 4. Prescription Accuracy: You are responsible for carefully inspecting your prescriptions before leaving our office. Have the discharge nurse carefully go over each prescription with you, before taking them home. Make sure that your name is accurately spelled, that your address is correct. Check the name and dose of your medication to make sure it is accurate. Check the number of pills, and the written instructions to make sure they are clear and accurate. Make sure that you are given enough medication to last  until your next medication refill appointment. 5. Taking Medication: Take medication as prescribed. Never take more pills than instructed. Never take medication more frequently than prescribed. Taking less pills or less frequently is permitted and encouraged, when it comes to controlled substances (written prescriptions).  6. Inform other Doctors: Always inform, all of your healthcare providers, of all the medications you take. 7. Pain Medication from other Providers: You are not allowed to accept any additional pain medication from any other Doctor or Healthcare provider. There are two exceptions to this rule. (see below) In the event that you require additional pain medication, you are responsible for notifying us, as stated below. 8. Medication Agreement: You are responsible for carefully reading and following our Medication Agreement. This must be signed before receiving any prescriptions from our practice. Safely store a copy of your signed Agreement. Violations to the Agreement will result in no further prescriptions. (Additional copies of our Medication Agreement are available upon request.) 9. Laws, Rules, & Regulations: All patients are expected to follow all Federal and State Laws, Statutes, Rules, & Regulations. Ignorance of the Laws does not constitute a valid excuse. The use of any illegal substances is prohibited. 10. Adopted CDC guidelines & recommendations: Target dosing levels will be at or below 60 MME/day. Use of benzodiazepines** is not recommended.  Exceptions: There are only two exceptions to the rule of not receiving pain medications from other Healthcare Providers. 1. Exception #1 (Emergencies): In the event of an emergency (i.e.: accident requiring emergency care), you are allowed to receive additional pain medication. However, you are responsible for: As soon as you are able, call our office [phone], at any time of the day or night, and leave a message stating your name, the  date and nature of the emergency, and the name and dose of the medication   prescribed. In the event that your call is answered by a member of our staff, make sure to document and save the date, time, and the name of the person that took your information.  2. Exception #2 (Planned Surgery): In the event that you are scheduled by another doctor or dentist to have any type of surgery or procedure, you are allowed (for a period no longer than 30 days), to receive additional pain medication, for the acute post-op pain. However, in this case, you are responsible for picking up a copy of our "Post-op Pain Management for Surgeons" handout, and giving it to your surgeon or dentist. This document is available at our office, and does not require an appointment to obtain it. Simply go to our office during business hours (Monday-Thursday from 8:00 AM to 4:00 PM) (Friday 8:00 AM to 12:00 Noon) or if you have a scheduled appointment with [place], prior to your surgery, and ask for it by name. In addition, you will need to provide [place] with your name, name of your surgeon, type of surgery, and date of procedure or surgery.  *Opioid medications include: morphine, codeine, oxycodone, oxymorphone, hydrocodone, hydromorphone, meperidine, tramadol, tapentadol, buprenorphine, fentanyl, methadone. **Benzodiazepine medications include: diazepam (Valium), alprazolam (Xanax), clonazepam (Klonopine), lorazepam (Ativan), clorazepate (Tranxene), chlordiazepoxide (Librium), estazolam (Prosom), oxazepam (Serax), temazepam (Restoril), triazolam (Halcion) (Last updated: 10/29/2017) ____________________________________________________________________________________________   BMI Assessment: Estimated body mass index is 40.62 kg/m as calculated from the following:   Height as of this encounter: 5\' 1"  (1.549 m).   Weight as of this encounter: 215 lb (97.5 kg).  BMI interpretation table: BMI level Category Range association with higher  incidence of chronic pain  <18 kg/m2 Underweight   18.5-24.9 kg/m2 Ideal body weight   25-29.9 kg/m2 Overweight Increased incidence by 20%  30-34.9 kg/m2 Obese (Class I) Increased incidence by 68%  35-39.9 kg/m2 Severe obesity (Class II) Increased incidence by 136%  >40 kg/m2 Extreme obesity (Class III) Increased incidence by 254%   Patient's current BMI Ideal Body weight  Body mass index is 40.62 kg/m. Ideal body weight: 52.3 kg (115 lb 4.8 oz) Adjusted ideal body weight: 70.4 kg (155 lb 2.9 oz)   BMI Readings from Last 4 Encounters:  01/12/18 40.62 kg/m  12/07/17 40.34 kg/m  10/15/17 37.41 kg/m  07/14/17 38.73 kg/m   Wt Readings from Last 4 Encounters:  01/12/18 215 lb (97.5 kg)  12/07/17 217 lb (98.4 kg)  10/15/17 198 lb (89.8 kg)  07/14/17 205 lb (93 kg)

## 2018-01-14 ENCOUNTER — Encounter (HOSPITAL_COMMUNITY): Payer: Self-pay | Admitting: Emergency Medicine

## 2018-01-14 ENCOUNTER — Emergency Department (HOSPITAL_COMMUNITY)
Admission: EM | Admit: 2018-01-14 | Discharge: 2018-01-15 | Disposition: A | Discharge status: Home / Self Care | Payer: Medicare HMO | Attending: Emergency Medicine | Admitting: Emergency Medicine

## 2018-01-14 ENCOUNTER — Other Ambulatory Visit: Payer: Self-pay

## 2018-01-14 ENCOUNTER — Emergency Department (HOSPITAL_COMMUNITY): Payer: Medicare HMO

## 2018-01-14 DIAGNOSIS — E119 Type 2 diabetes mellitus without complications: Secondary | ICD-10-CM | POA: Insufficient documentation

## 2018-01-14 DIAGNOSIS — E039 Hypothyroidism, unspecified: Secondary | ICD-10-CM | POA: Insufficient documentation

## 2018-01-14 DIAGNOSIS — R0600 Dyspnea, unspecified: Secondary | ICD-10-CM | POA: Diagnosis not present

## 2018-01-14 DIAGNOSIS — I10 Essential (primary) hypertension: Secondary | ICD-10-CM | POA: Insufficient documentation

## 2018-01-14 DIAGNOSIS — F17210 Nicotine dependence, cigarettes, uncomplicated: Secondary | ICD-10-CM | POA: Diagnosis not present

## 2018-01-14 DIAGNOSIS — Z79899 Other long term (current) drug therapy: Secondary | ICD-10-CM | POA: Diagnosis not present

## 2018-01-14 DIAGNOSIS — I2510 Atherosclerotic heart disease of native coronary artery without angina pectoris: Secondary | ICD-10-CM | POA: Diagnosis not present

## 2018-01-14 DIAGNOSIS — R0602 Shortness of breath: Secondary | ICD-10-CM

## 2018-01-14 DIAGNOSIS — R05 Cough: Secondary | ICD-10-CM | POA: Diagnosis present

## 2018-01-14 DIAGNOSIS — Z7901 Long term (current) use of anticoagulants: Secondary | ICD-10-CM | POA: Insufficient documentation

## 2018-01-14 LAB — CBC WITH DIFFERENTIAL/PLATELET
Abs Immature Granulocytes: 0.6 10*3/uL — ABNORMAL HIGH (ref 0.0–0.1)
BASOS ABS: 0.1 10*3/uL (ref 0.0–0.1)
Basophils Relative: 1 %
EOS PCT: 1 %
Eosinophils Absolute: 0.2 10*3/uL (ref 0.0–0.7)
HCT: 38.9 % — ABNORMAL LOW (ref 39.0–52.0)
HEMOGLOBIN: 11.3 g/dL — AB (ref 13.0–17.0)
Immature Granulocytes: 4 %
LYMPHS PCT: 2 %
Lymphs Abs: 0.4 10*3/uL — ABNORMAL LOW (ref 0.7–4.0)
MCH: 21.6 pg — AB (ref 26.0–34.0)
MCHC: 29 g/dL — AB (ref 30.0–36.0)
MCV: 74.4 fL — ABNORMAL LOW (ref 78.0–100.0)
MONO ABS: 0.6 10*3/uL (ref 0.1–1.0)
Monocytes Relative: 4 %
Neutro Abs: 12.7 10*3/uL — ABNORMAL HIGH (ref 1.7–7.7)
Neutrophils Relative %: 88 %
Platelets: 241 10*3/uL (ref 150–400)
RBC: 5.23 MIL/uL (ref 4.22–5.81)
RDW: 19.9 % — ABNORMAL HIGH (ref 11.5–15.5)
WBC: 14.5 10*3/uL — ABNORMAL HIGH (ref 4.0–10.5)

## 2018-01-14 LAB — COMPREHENSIVE METABOLIC PANEL
ALBUMIN: 2 g/dL — AB (ref 3.5–5.0)
ALK PHOS: 60 U/L (ref 38–126)
ALT: 16 U/L — ABNORMAL LOW (ref 17–63)
AST: 20 U/L (ref 15–41)
Anion gap: 5 (ref 5–15)
BUN: 12 mg/dL (ref 6–20)
CALCIUM: 8 mg/dL — AB (ref 8.9–10.3)
CO2: 33 mmol/L — AB (ref 22–32)
CREATININE: 1.01 mg/dL (ref 0.61–1.24)
Chloride: 101 mmol/L (ref 101–111)
GFR calc Af Amer: >60 mL/min (ref >60)
GFR calc non Af Amer: >60 mL/min (ref >60)
GLUCOSE: 91 mg/dL (ref 65–99)
Potassium: 4.4 mmol/L (ref 3.5–5.1)
SODIUM: 139 mmol/L (ref 135–145)
Total Bilirubin: 0.3 mg/dL (ref 0.3–1.2)
Total Protein: 5 g/dL — ABNORMAL LOW (ref 6.5–8.1)

## 2018-01-14 NOTE — ED Triage Notes (Signed)
Patient states that he has had URI symptoms for the last 3 weeks.  He also had a conjunctivitis and burst a blood vessel in left eye, which has gotten better.  He states that he has been coughing with these symptoms.  He has been to his PCP and states that he has not gotten any better.

## 2018-01-15 LAB — DIGOXIN LEVEL: Digoxin Level: 0.6 ng/mL — ABNORMAL LOW (ref 0.8–2.0)

## 2018-01-15 LAB — URINALYSIS, ROUTINE W REFLEX MICROSCOPIC
Bilirubin Urine: NEGATIVE
Glucose, UA: NEGATIVE mg/dL
Hgb urine dipstick: NEGATIVE
Ketones, ur: NEGATIVE mg/dL
LEUKOCYTES UA: NEGATIVE
NITRITE: NEGATIVE
Protein, ur: NEGATIVE mg/dL
SPECIFIC GRAVITY, URINE: 1.005 (ref 1.005–1.030)
pH: 7 (ref 5.0–8.0)

## 2018-01-15 LAB — TROPONIN I: Troponin I: <0.03 ng/mL (ref <0.03)

## 2018-01-15 LAB — BRAIN NATRIURETIC PEPTIDE: B Natriuretic Peptide: 142.5 pg/mL — ABNORMAL HIGH (ref 0.0–100.0)

## 2018-01-15 MED ORDER — METHOCARBAMOL 500 MG PO TABS
500.0000 mg | ORAL_TABLET | Freq: Once | ORAL | Status: AC
  Administered 2018-01-15: 500 mg via ORAL
  Filled 2018-01-15: qty 1

## 2018-01-15 MED ORDER — AZITHROMYCIN 250 MG PO TABS
500.0000 mg | ORAL_TABLET | Freq: Once | ORAL | Status: AC
  Administered 2018-01-15: 500 mg via ORAL
  Filled 2018-01-15: qty 2

## 2018-01-15 MED ORDER — DEXTROSE 5 % IV SOLN
120.0000 mg | Freq: Once | INTRAVENOUS | Status: AC
  Administered 2018-01-15: 120 mg via INTRAVENOUS
  Filled 2018-01-15: qty 10

## 2018-01-15 MED ORDER — AZITHROMYCIN 250 MG PO TABS: 250.0000 mg | ORAL_TABLET | Freq: Every day | ORAL | 0 refills | Status: AC

## 2018-01-15 MED ORDER — OXYCODONE HCL 5 MG PO TABS
5.0000 mg | ORAL_TABLET | Freq: Once | ORAL | Status: AC
  Administered 2018-01-15: 5 mg via ORAL
  Filled 2018-01-15: qty 1

## 2018-01-15 MED ORDER — AZITHROMYCIN 250 MG PO TABS: 500.0000 mg | ORAL_TABLET | Freq: Once | ORAL | Status: DC

## 2018-01-15 NOTE — Discharge Instructions (Signed)
As discussed, your evaluation today has been largely reassuring.  But, it is important that you monitor your condition carefully, and do not hesitate to return to the ED if you develop new, or concerning changes in your condition. ? ?Otherwise, please follow-up with your physician for appropriate ongoing care. ? ?

## 2018-01-15 NOTE — ED Provider Notes (Signed)
[facility] EMERGENCY DEPARTMENT Provider Note   CSN: [id number] Arrival date & time: 01/14/18  7341     History   Chief Complaint Chief Complaint  Patient presents with  . Influenza    HPI Lance Cook is a 57 y.o. male.  HPI Patient presents with his wife who assists with the HPI. He presents due to concern of fatigue, dyspnea, cough. Symptoms have been progressive over the past few weeks, in spite of taking medication as directed including twice daily Lasix. No report of fever. Patient has multiple other medical issues beyond heart failure, including prior valve repair, chronic pain for which she takes oxycodone and Robaxin.  No clear precipitant for this illness, and since onset, no clear alleviating or exacerbating factors. Patient has seen his primary care physician, and an ophthalmologist.  The latter was seen due to left eye conjunctivitis, and he notes that this has improved substantially since he completed treatment with topical medication.    Past Medical History:  Diagnosis Date  . Anemia   . Anxiety   . ARDS (adult respiratory distress syndrome) ([place]) 01/27/2015  . Arthritis   . Ascending aortic dissection ([place]) 07/14/2008   Localized dissection of ascending aorta noted on CTA in 2009 and stable on CTA in 2011  . Asymptomatic chronic venous hypertension 01/15/2010   Overview:  Overview:  Qualifier: Diagnosis of  By: [name] MD, [name]   Last Assessment & Plan:  I suggested he go to a vein clinic to see if he could be qualified compression hose of some type, since he is not a surgical candidate according to the vascular surgeons.   . Atrial fibrillation ([place])    chronic persistent  . Bell's palsy   . CAD (coronary artery disease)    Old scar inferior wall myoview, 10/2009 EF 52%.  He did have previous SVG to RCA but no obstructive disease noted on his most recent cath.  SVG occluded.   . Cellulitis 04/02/2014  . Cerebral artery occlusion with  cerebral infarction ([place]) 10/08/2011   Overview:  Overview:  And hx of TIA prior to CABG, all thought due to systemic emboli prior to coumadin   . Chronic diastolic heart failure ([place]) 03/01/2015   a. 12/2015: echo showing a preserved EF of 65-70%, moderate AS, and moderate TR.   Marland Kitchen Chronic LBP 10/08/2011  . CVA (cerebral vascular accident) ([place]) 10/08/2011   And hx of TIA prior to CABG, all thought due to systemic emboli prior to coumadin   . DIABETES MELLITUS, TYPE II 11/01/2009   Qualifier: Diagnosis of  By: [name] MD, [name]    . Diverticulosis   . 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 Encephalopathy acute 02/05/2015  . GERD (gastroesophageal reflux disease)    not needing medication at thhis time- 01/22/15  . Gout   . H/O mechanical aortic valve replacement 11/01/2009   Qualifier: Diagnosis of  By: [name], [name]    . Hiatal hernia   . History of colon polyps 12/23/2011   Overview:  Overview:  Colonoscopy January 2010, 6 mm rectal tubulovillous adenoma. No high-grade dysplasia   . Hyperlipidemia   . Hypertension   . Hypogonadism male 04/08/2012  . Impaired glucose tolerance 10/08/2011  . Morbid obesity ([place]) 04/02/2014  . Myocardial infarction [facility]) age 57  . OSA (obstructive sleep apnea)    CPAP  .  Peripheral vascular disease ([place])   . 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 2/72/5366   33 mm St Jude bileaflet mechanical valve placed via right mini thoracotomy approach  . 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    1988 - Dr Blase Mess at [facility] in [place], [place]  . Severe mitral regurgitation 10/11/2014  . Testicular cancer [facility])    He was 57 y/o. He had surgical resection and rad tx's.   Marland Kitchen TIA (transient ischemic attack)    age 42  . Varicose veins     Patient Active  Problem List   Diagnosis Date Noted  . Chronic anticoagulation 01/05/2018  . Insomnia 11/23/2017  . Unilateral vocal cord paralysis 11/30/2016  . Leg swelling 11/28/2016  . S/P MVR (mitral valve replacement)   . Acute on chronic diastolic CHF (congestive heart failure) ([place]) 08/27/2016  . Chronic pain syndrome 07/10/2016  . Chronic diarrhea   . Benign neoplasm of transverse colon   . Scrotal swelling 05/30/2016  . Musculoskeletal pain 01/07/2016  . Personal history of malignant neoplasm of testis 07/12/2015  . Long term current use of opiate analgesic 07/11/2015  . Long term prescription opiate use 07/11/2015  . Opiate use (22.5 MME/day) 07/11/2015  . Opiate dependence ([place]) 07/11/2015  . Encounter for therapeutic drug level monitoring 07/11/2015  . Chronic low back pain (midline) 07/11/2015  . Lumbar facet syndrome 07/11/2015  . Chronic lower extremity pain (Left) 07/11/2015  . Adult BMI 30+ 06/19/2015  . Obesity (BMI 30-39.9) 06/19/2015  . Hypomagnesemia 02/08/2015  . Physical deconditioning 02/08/2015  . Enteritis due to Clostridium difficile 01/30/2015  . 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 44/10/4740  . Chronic diastolic congestive heart failure ([place])   . Type 2 diabetes mellitus with other specified complication ([place]) 59/56/3875  . 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 Obstructive apnea 05/23/2014  . Obstructive sleep apnea 05/23/2014  . Venous insufficiency of both lower extremities 04/02/2014  . Morbid obesity ([place]) 04/02/2014  . Type II or unspecified type diabetes mellitus with other specified manifestations, not stated as uncontrolled 03/14/2014  . Personal history of transient ischemic attack (TIA), and cerebral infarction without residual deficits 02/23/2014  . Malignant neoplasm of testis ([place]) 02/23/2014  . H/O TIA (transient ischemic attack) and stroke 02/23/2014  . HTN (hypertension)  02/23/2014  . Lymphadenopathy 02/23/2014  . S/P AVR (aortic valve replacement) 02/23/2014  . Encounter for therapeutic drug monitoring 02/02/2014  . Long term current use of anticoagulant therapy 07/15/2012  . Hypogonadism male 04/08/2012  . 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 Testicular hypofunction 04/08/2012  . Male hypogonadism 04/08/2012  . Other testicular hypofunction 04/08/2012  . History of colonic polyps 12/23/2011  . Testicular cancer ([place])   . TIA (transient ischemic attack)   . OSA (obstructive sleep apnea)   . Lumbar spinal stenosis (Severe L4-5) 10/08/2011  . ED (erectile dysfunction) of organic origin 10/08/2011  . Lumbar canal stenosis 10/08/2011  . Low back pain 10/08/2011  . Spinal stenosis of lumbar region without neurogenic claudication 10/08/2011  . CAD (coronary artery disease)   . Spondylosis 04/29/2010  . DYSLIPIDEMIA 01/15/2010  . Chronic venous hypertension with ulcer ([place]) 01/15/2010  . Essential hypertension 12/14/2009  . Gout 11/01/2009  . Anemia, iron deficiency 11/01/2009  . MYOCARDIAL INFARCTION, HX OF 11/01/2009  . Atrial fibrillation, chronic 11/01/2009  . PERIPHERAL EDEMA 11/01/2009  . Heart  valve replaced by other means 11/01/2009  . Presence of other heart-valve replacement 11/01/2009  . Ascending aortic dissection ([place]) 07/14/2008  . 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 Venous (peripheral) insufficiency 02/03/2005    Past Surgical History:  Procedure Laterality Date  . aortic truck    . BENTALL PROCEDURE  1988   25 mm St Jude mechanical valve conduit - Dr Blase Mess at [facility] in [place], [place]  . CHOLECYSTECTOMY  2011  . COLONOSCOPY WITH PROPOFOL N/A 06/17/2016   Procedure: COLONOSCOPY WITH PROPOFOL;  Surgeon: Jerene Bears, MD;  Location: [facility] ENDOSCOPY;  Service: Gastroenterology;  Laterality: N/A;  . CORONARY ARTERY BYPASS GRAFT    . LASER ABLATION Left  03/06/2010   leg  . MITRAL VALVE REPLACEMENT Right 01/24/2015   Procedure: Re-Operation, MINIMALLY INVASIVE MITRAL VALVE (MV) REPLACEMENT.;  Surgeon: Rexene Alberts, MD;  Location: [facility];  Service: Open Heart Surgery;  Laterality: Right;  . ORCHIECTOMY  age 57   testicular cancer  . OTOPLASTY     bilateral, age 18  . TEE WITHOUT CARDIOVERSION N/A 10/11/2014   Procedure: TRANSESOPHAGEAL ECHOCARDIOGRAM (TEE);  Surgeon: Pixie Casino, MD;  Location: [facility] ENDOSCOPY;  Service: Cardiovascular;  Laterality: N/A;  . TEE WITHOUT CARDIOVERSION N/A 01/24/2015   Procedure: TRANSESOPHAGEAL ECHOCARDIOGRAM (TEE);  Surgeon: Rexene Alberts, MD;  Location: [facility];  Service: Open Heart Surgery;  Laterality: N/A;  . TONSILLECTOMY  1967  . TOOTH EXTRACTION  2016        Home Medications    Prior to Admission medications   Medication Sig Start Date End Date Taking? Authorizing Provider  acetaminophen (TYLENOL) 500 MG tablet Take 500 mg by mouth every 8 (eight) hours as needed for moderate pain or headache.    Yes [provider]  allopurinol (ZYLOPRIM) 300 MG tablet Take 300 mg by mouth daily. 11/17/12  Yes Biagio Borg, MD  atenolol (TENORMIN) 25 MG tablet Take 1 tablet (25 mg total) by mouth 2 (two) times daily. 09/30/16  Yes Strader, [place] M, PA-C  digoxin (LANOXIN) 0.25 MG tablet Take 1 tablet (250 mcg total) by mouth daily. 11/23/17  Yes [name], MD  furosemide (LASIX) 80 MG tablet Take 1 tablet (80 mg total) by mouth 2 (two) times daily. 04/29/17  Yes [name], MD  magnesium oxide (MAG-OX) 400 (241.3 MG) MG tablet Take 1 tablet (400 mg total) by mouth 2 (two) times daily. 06/04/15  Yes [name], MD  methocarbamol (ROBAXIN) 500 MG tablet Take 1 tablet (500 mg total) by mouth 3 (three) times daily. 01/12/18 04/12/18 Yes Vevelyn Francois, NP  oxyCODONE (OXY IR/ROXICODONE) 5 MG immediate release tablet Take 1 tablet (5 mg total) by mouth every 8 (eight) hours as needed for moderate  pain or severe pain. 03/31/18 04/30/18 Yes Vevelyn Francois, NP  potassium chloride (K-DUR) 10 MEQ tablet Take 2 tablets (20 mEq total) by mouth 2 (two) times daily. 10/12/17  Yes [name], MD  traZODone (DESYREL) 50 MG tablet Take 2 tablets by mouth at bedtime.  11/20/17  Yes [provider]  warfarin (COUMADIN) 5 MG tablet Take 5-7.5 mg by mouth See admin instructions. Take 1 and 1/2 tablets on Sunday, Tuesday and Thursday then take 1 tablet all the other days   Yes [provider]  oxyCODONE (OXY IR/ROXICODONE) 5 MG immediate release tablet Take 1 tablet (5 mg total) by mouth  every 8 (eight) hours as needed for severe pain. Patient not taking: Reported on 01/15/2018 03/01/18 03/31/18  Vevelyn Francois, NP  oxyCODONE (OXY IR/ROXICODONE) 5 MG immediate release tablet Take 1 tablet (5 mg total) by mouth every 8 (eight) hours as needed for severe pain. Patient not taking: Reported on 01/15/2018 01/30/18 03/01/18  Vevelyn Francois, NP  warfarin (COUMADIN) 5 MG tablet TAKE 1 TO 1 AND 1/2 TABLETS DAILY AS DIRECTED BY COUMADIN CLINIC Patient not taking: Reported on 01/15/2018 11/25/17   [name], MD    Family History Family History  Problem Relation Age of Onset  . Heart disease Mother   . Throat cancer Mother   . Other Mother        bowel obstruction  . Diabetes Father   . Stroke Other   . Hypertension Other   . Cancer Maternal Aunt        lung, brain  . Cancer Maternal Uncle        brain aneurysm  . Colon cancer Neg Hx     Social History Social History   Tobacco Use  . Smoking status: Light Tobacco Smoker    Types: Cigars  . Smokeless tobacco: Never Used  . Tobacco comment: about 3 yearly- cigar  Substance Use Topics  . Alcohol use: Yes    Alcohol/week: 0.0 oz    Comment: social  . Drug use: No     Allergies   Penicillins; Adhesive [tape]; and Bactrim [sulfamethoxazole-trimethoprim]   Review of Systems Review of Systems  Constitutional:       Per HPI,  otherwise negative  HENT:       Per HPI, otherwise negative  Eyes:       Per HPI  Respiratory:       Per HPI, otherwise negative  Cardiovascular:       Per HPI, otherwise negative  Gastrointestinal: Negative for vomiting.  Endocrine:       Negative aside from HPI  Genitourinary:       Neg aside from HPI   Musculoskeletal:       Per HPI, otherwise negative  Skin: Negative.   Neurological: Positive for weakness. Negative for syncope.     Physical Exam Updated Vital Signs BP 122/75   Pulse 87   Temp 99.6 F (37.6 C) (Oral)   Resp 18   SpO2 94%   Physical Exam  Constitutional: He is oriented to person, place, and time.  Sickly appearing male awake and alert  HENT:  Head: Normocephalic and atraumatic.  Eyes: Conjunctivae and EOM are normal.  Cardiovascular: Normal rate and regular rhythm.  Pulmonary/Chest: He has decreased breath sounds.  Abdominal: He exhibits no distension. There is no tenderness.  Musculoskeletal: He exhibits edema.  Neurological: He is alert and oriented to person, place, and time.  Skin: Skin is warm and dry.  Psychiatric: He has a normal mood and affect.  Nursing note and vitals reviewed.    ED Treatments / Results  Labs (all labs ordered are listed, but only abnormal results are displayed) Labs Reviewed  CBC WITH DIFFERENTIAL/PLATELET - Abnormal; Notable for the following components:      Result Value   WBC 14.5 (*)    Hemoglobin 11.3 (*)    HCT 38.9 (*)    MCV 74.4 (*)    MCH 21.6 (*)    MCHC 29.0 (*)    RDW 19.9 (*)    Neutro Abs 12.7 (*)    Lymphs Abs 0.4 (*)  Abs Immature Granulocytes 0.6 (*)    All other components within normal limits  COMPREHENSIVE METABOLIC PANEL - Abnormal; Notable for the following components:   CO2 33 (*)    Calcium 8.0 (*)    Total Protein 5.0 (*)    Albumin 2.0 (*)    ALT 16 (*)    All other components within normal limits  BRAIN NATRIURETIC PEPTIDE - Abnormal; Notable for the following  components:   B Natriuretic Peptide 142.5 (*)    All other components within normal limits  DIGOXIN LEVEL - Abnormal; Notable for the following components:   Digoxin Level 0.6 (*)    All other components within normal limits  URINALYSIS, ROUTINE W REFLEX MICROSCOPIC - Abnormal; Notable for the following components:   Color, Urine COLORLESS (*)    All other components within normal limits  TROPONIN I     Radiology Dg Chest 2 View  Result Date: 01/14/2018 CLINICAL DATA:  Productive cough for 3 weeks. Low-grade fever for 2 weeks. EXAM: CHEST - 2 VIEW COMPARISON:  09/29/2016 FINDINGS: Median sternotomy and valve replacement. The heart is enlarged and stable in configuration. Prominence of interstitial markings appears stable and chronic, favoring interstitial lung disease over interstitial edema. There are no focal consolidations. No alveolar edema. IMPRESSION: 1. Stable cardiomegaly. 2. Chronic lung changes. Electronically Signed   By: Nolon Nations M.D.   On: 01/14/2018 20:09    Procedures Procedures (including critical care time)  Medications Ordered in ED Medications  oxyCODONE (Oxy IR/ROXICODONE) immediate release tablet 5 mg (5 mg Oral Given 01/15/18 0126)  methocarbamol (ROBAXIN) tablet 500 mg (500 mg Oral Given 01/15/18 0126)  furosemide (LASIX) 120 mg in dextrose 5 % 50 mL IVPB (0 mg Intravenous Stopped 01/15/18 0310)     Initial Impression / Assessment and Plan / ED Course  I have reviewed the triage vital signs and the nursing notes.  Pertinent labs & imaging results that were available during my care of the patient were reviewed by me and considered in my medical decision making (see chart for details).    Update:, Patient in similar condition, awake and alert, hemodynamically unremarkable He has received Lasix, has had urination, several times, states that he feels better. 4:47 AM Patient continues to appear better, states that he feels better. We discussed all  findings, reassuring urinalysis, generally reassuring labs. There is mild leukocytosis, and given the patient's description of mild cough, patient will start a short course of antibiotics. With normal troponin, no chest pain, and his improvement after diuresis, there is suspicion for heart failure exacerbation. Given his improvement here, stable vital signs, patient appropriate for discharge with close outpatient follow-up.   Final Clinical Impressions(s) / ED Diagnoses  Dyspnea   Carmin Muskrat, MD 01/15/18 [phone]

## 2018-01-16 LAB — TOXASSURE SELECT 13 (MW), URINE

## 2018-01-21 ENCOUNTER — Ambulatory Visit (INDEPENDENT_AMBULATORY_CARE_PROVIDER_SITE_OTHER): Payer: Medicare HMO | Admitting: *Deleted

## 2018-01-21 ENCOUNTER — Telehealth: Payer: Self-pay | Admitting: Cardiology

## 2018-01-21 DIAGNOSIS — I4891 Unspecified atrial fibrillation: Secondary | ICD-10-CM

## 2018-01-21 DIAGNOSIS — Z5181 Encounter for therapeutic drug level monitoring: Secondary | ICD-10-CM

## 2018-01-21 LAB — POCT INR: INR: 2.5 (ref 2.0–3.0)

## 2018-01-21 MED ORDER — ENOXAPARIN SODIUM 100 MG/ML ~~LOC~~ SOLN: 100.0000 mg | Freq: Two times a day (BID) | SUBCUTANEOUS | 0 refills | Status: DC

## 2018-01-21 NOTE — Telephone Encounter (Signed)
New Message    Pt c/o medication issue:  1. Name of Medication:   enoxaparin (LOVENOX) 100 MG/ML injection        2. How are you currently taking this medication (dosage and times per day)?   3. Are you having a reaction (difficulty breathing--STAT)?  4. What is your medication issue? Patient is calling to advise that [facility] will be sending in a tier reduction paperwork that needs to be filled out without how he should be taking the medication.

## 2018-01-21 NOTE — Patient Instructions (Signed)
Description   Continue taking 1 tablet daily except 1.5 tablets on Sundays, Tuesdays and Thursdays.  Recheck INR 4 weeks.

## 2018-01-21 NOTE — Telephone Encounter (Signed)
Nya - this is for you

## 2018-01-26 ENCOUNTER — Telehealth: Payer: Self-pay | Admitting: Cardiology

## 2018-01-26 NOTE — Telephone Encounter (Signed)
New Message:       Pt states he is having spasms and having to take muscle relaxers and pain medication. Pt wants to make sure it's not anything related to his heart.

## 2018-01-26 NOTE — Telephone Encounter (Addendum)
Received call back from wife requesting to speak with nurse.   Wife yelling and upset that we have told the patient "there is nothing to worry about".  Advised this was a misunderstanding as patient was instructed based on his symptoms, this was likely to not me cardiac related and Dr. [name] had agreed with this assessment.    Wife continued to yell and demand something be done about this.  Wife states this is happening daily and he needs to be seen.  Request to have scheduler call back to schedule appointment.  Wife demanded to be transferred to a scheduler now and she will not hang up without an appt. Advised a scheduler would need to call back as they are helping patients at current.  Wife continued to yell at nurse and demand something be done now as she thinks something is wrong with patient.

## 2018-01-26 NOTE — Progress Notes (Signed)
Cardiology Office Note   Date:  01/28/2018   ID:  Lance Cook, DOB 1961/01/10, MRN [id number]  PCP:  [name], PA-C  Cardiologist:   [name], MD    Chief Complaint  Patient presents with  . Muscle Aches      History of Present Illness: Lance Cook is a 57 y.o. male who presents for follow up of valvular heart disease.   He has a past medical history of AVR (s/p Bentall procedure in 1988), severe mitral regurgitation (s/p St Jude mechanical valve placed in 12/2014), CAD (s/p SVG-RCA, known to be occluded by cath in 12/2014), chronic atrial fibrillation, chronic diastolic CHF (EF 40-98% by echo in 12/2015), prior CVA, chronic venous insufficiency, OSA (on CPAP)and recurrent C. difficile infection.     Since I last saw him he called because of chest pain.  Actually this turns out to be spasm that started in his right arm and then radiated across to his chest.  It then radiated to the left arm and in the legs.  He had some mild fluttering .   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  He took multiple over-the-counter agents like tonic water.  He took his muscle relaxants.  His symptoms resolved but the next day he was fatigued.  It been the emergency room on the 16th and I did review these records.  He had fatigue muscle aches shortness of breath.  He was given some diuretic.  His enzymes were normal.  There were no acute findings and he was not kept in the emergency room.  The other day his wife called and said she was quite agitated because she thought he was having heart spasm.  However, they refused the emergency room.  He refused an appointment with the PA.  He has had some weight gain but it is actually come down.  He is taken some extra diuretic but we will follow his labs routinely.  He is not having any new shortness of breath, PND or orthopnea now.  He is not describing substernal chest pressure, neck or arm discomfort.  He has not had  presyncope or syncope.  He has chronic pain but he has not had any conversations with his pain management clinic.  Past Medical History:  Diagnosis Date  . Anemia   . Anxiety   . ARDS (adult respiratory distress syndrome) ([place]) 01/27/2015  . Arthritis   . Ascending aortic dissection ([place]) 07/14/2008   Localized dissection of ascending aorta noted on CTA in 2009 and stable on CTA in 2011  . Asymptomatic chronic venous hypertension 01/15/2010   Overview:  Overview:  Qualifier: Diagnosis of  By: [name] MD, [name]   Last Assessment & Plan:  I suggested he go to a vein clinic to see if he could be qualified compression hose of some type, since he is not a surgical candidate according to the vascular surgeons.   . Atrial fibrillation ([place])    chronic persistent  . Bell's palsy   . CAD (coronary artery disease)    Old scar inferior wall myoview, 10/2009 EF 52%.  He did have previous SVG to RCA but no obstructive disease noted on his most recent cath.  SVG occluded.   . Cellulitis 04/02/2014  . Cerebral artery occlusion with cerebral infarction ([place]) 10/08/2011   Overview:  Overview:  And hx of TIA prior to CABG, all thought due to systemic emboli prior to coumadin   .  Chronic diastolic heart failure ([place]) 03/01/2015   a. 12/2015: echo showing a preserved EF of 65-70%, moderate AS, and moderate TR.   Marland Kitchen Chronic LBP 10/08/2011  . CVA (cerebral vascular accident) ([place]) 10/08/2011   And hx of TIA prior to CABG, all thought due to systemic emboli prior to coumadin   . DIABETES MELLITUS, TYPE II 11/01/2009   Qualifier: Diagnosis of  By: [name] MD, [name]    . Diverticulosis   . 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 Encephalopathy acute 02/05/2015  . GERD (gastroesophageal reflux disease)    not needing medication at thhis time- 01/22/15  . Gout   . H/O mechanical  aortic valve replacement 11/01/2009   Qualifier: Diagnosis of  By: [name], [name]    . Hiatal hernia   . History of colon polyps 12/23/2011   Overview:  Overview:  Colonoscopy January 2010, 6 mm rectal tubulovillous adenoma. No high-grade dysplasia   . Hyperlipidemia   . Hypertension   . Hypogonadism male 04/08/2012  . Impaired glucose tolerance 10/08/2011  . Morbid obesity ([place]) 04/02/2014  . Myocardial infarction [facility]) age 43  . OSA (obstructive sleep apnea)    CPAP  . Peripheral vascular disease ([place])   . 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 02/26/3150   33 mm St Jude bileaflet mechanical valve placed via right mini thoracotomy approach  . 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    1988 - Dr Blase Mess at [facility] in [place], [place]  . Severe mitral regurgitation 10/11/2014  . Testicular cancer [facility])    He was 57 y/o. He had surgical resection and rad tx's.   Marland Kitchen TIA (transient ischemic attack)    age 65  . Varicose veins     Past Surgical History:  Procedure Laterality Date  . aortic truck    . BENTALL PROCEDURE  1988   25 mm St Jude mechanical valve conduit - Dr Blase Mess at [facility] in [place], [place]  . CHOLECYSTECTOMY  2011  . COLONOSCOPY WITH PROPOFOL N/A 06/17/2016   Procedure: COLONOSCOPY WITH PROPOFOL;  Surgeon: Jerene Bears, MD;  Location: [facility] ENDOSCOPY;  Service: Gastroenterology;  Laterality: N/A;  . CORONARY ARTERY BYPASS GRAFT    . LASER ABLATION Left 03/06/2010   leg  . MITRAL VALVE REPLACEMENT Right 01/24/2015   Procedure: Re-Operation, MINIMALLY INVASIVE MITRAL VALVE (MV) REPLACEMENT.;  Surgeon: Rexene Alberts, MD;  Location: [place];  Service: Open Heart Surgery;  Laterality: Right;  . ORCHIECTOMY  age 54   testicular cancer  . OTOPLASTY     bilateral, age 74  . TEE WITHOUT CARDIOVERSION N/A 10/11/2014   Procedure: TRANSESOPHAGEAL  ECHOCARDIOGRAM (TEE);  Surgeon: Pixie Casino, MD;  Location[facility] ENDOSCOPY;  Service: Cardiovascular;  Laterality: N/A;  . TEE WITHOUT CARDIOVERSION N/A 01/24/2015   Procedure: TRANSESOPHAGEAL ECHOCARDIOGRAM (TEE);  Surgeon: Rexene Alberts, MD;  Location: [place];  Service: Open Heart Surgery;  Laterality: N/A;  . TONSILLECTOMY  1967  . TOOTH EXTRACTION  2016     Current Outpatient Medications  Medication Sig Dispense Refill  . acetaminophen (TYLENOL) 500 MG tablet Take 500 mg by mouth every 8 (eight) hours as needed for moderate pain or headache.     . allopurinol (ZYLOPRIM)  300 MG tablet Take 300 mg by mouth daily.    Marland Kitchen atenolol (TENORMIN) 25 MG tablet Take 1 tablet (25 mg total) by mouth 2 (two) times daily.    . digoxin (LANOXIN) 0.25 MG tablet Take 1 tablet (250 mcg total) by mouth daily. 90 tablet 2  . enoxaparin (LOVENOX) 100 MG/ML injection Inject 1 mL (100 mg total) into the skin every 12 (twelve) hours. 20 Syringe 0  . furosemide (LASIX) 80 MG tablet Take 1 tablet (80 mg total) by mouth 2 (two) times daily. 180 tablet 3  . magnesium oxide (MAG-OX) 400 (241.3 MG) MG tablet Take 1 tablet (400 mg total) by mouth 2 (two) times daily. 180 tablet 3  . methocarbamol (ROBAXIN) 500 MG tablet Take 1 tablet (500 mg total) by mouth 3 (three) times daily. 270 tablet 0  . [START ON 01/30/2018] oxyCODONE (OXY IR/ROXICODONE) 5 MG immediate release tablet Take 1 tablet (5 mg total) by mouth every 8 (eight) hours as needed for severe pain. 90 tablet 0  . potassium chloride (K-DUR) 10 MEQ tablet Take 2 tablets (20 mEq total) by mouth 2 (two) times daily. 120 tablet 0  . traZODone (DESYREL) 50 MG tablet Take 2 tablets by mouth at bedtime.     Marland Kitchen warfarin (COUMADIN) 5 MG tablet Take 5-7.5 mg by mouth See admin instructions. Take 1 and 1/2 tablets on Sunday, Tuesday and Thursday then take 1 tablet all the other days     No current facility-administered medications for this visit.     Allergies:    Penicillins; Adhesive [tape]; and Bactrim [sulfamethoxazole-trimethoprim]   ROS:  Please see the history of present illness.   Otherwise, review of systems are positive for none.   All other systems are reviewed and negative.    PHYSICAL EXAM: VS:  BP 130/70   Pulse 65   Ht 5\' 1"  (1.549 m)   Wt 212 lb 9.6 oz (96.4 kg)   BMI 40.17 kg/m  , BMI Body mass index is 40.17 kg/m.  GENERAL:  Well appearing NECK:  No jugular venous distention, waveform within normal limits, carotid upstroke brisk and symmetric, no bruits, no thyromegaly LUNGS:  Clear to auscultation bilaterally CHEST:  Well healed surgical scars.  HEART:  PMI not displaced or sustained,S1 mechanical and S2 within normal limits, no S3, no S4, no clicks, no rubs, no murmurs ABD:  Flat, positive bowel sounds normal in frequency in pitch, no bruits, no rebound, no guarding, no midline pulsatile mass, no hepatomegaly, no splenomegaly EXT:  2 plus pulses throughout, no edema, no cyanosis no clubbing    EKG:  EKG  is ordered today. Atrial fibrillation, left bundle branch block, rate 66, no change from previous.    Recent Labs: 01/14/2018: ALT 16; B Natriuretic Peptide 142.5; BUN 12; Creatinine, Ser 1.01; Hemoglobin 11.3; Platelets 241; Potassium 4.4; Sodium 139    Lipid Panel    Component Value Date/Time   CHOL 118 08/18/2012 1137   TRIG 133.0 08/18/2012 1137   HDL 27.20 (L) 08/18/2012 1137   CHOLHDL 4 08/18/2012 1137   VLDL 26.6 08/18/2012 1137   LDLCALC 64 08/18/2012 1137      Wt Readings from Last 3 Encounters:  01/28/18 212 lb 9.6 oz (96.4 kg)  01/12/18 215 lb (97.5 kg)  12/07/17 217 lb (98.4 kg)      Other studies Reviewed: Additional studies/ records that were reviewed today include: ED records Review of the above records demonstrates:     ASSESSMENT AND  PLAN:  MUSCLE PAIN:   I will check a BMET, TSH and CK.  I do not suspect a cardiac etiology.  I have encouraged him to call his pain management specialist  to see if there are other treatments for muscle spasm.  I explained that if he thought he was having an acute cardiac event he should call 911 or present to the emergency room next time.  I explained that we are not the safest place to accommodate same-day work and might be unstable cardiac symptoms if that is what they think is happening.    MVR:  This has been stable with the last echo in 2017. I will repeat an echocardiogram to follow up given his weight gain.   AVR:   Again this was stable in 2017 on echo.  This will be evaluated as above.  HTN:   The blood pressure is at target.   EDEMA:    We talked about this again today at great length.  His weight is up from his lowest weight in the last several months and if it continues to go up light at a low dose of intermittent Zaroxolyn.  We are following his chemistries frequently.  Right now he has no overt left-sided heart failure symptoms.  He should be  ATRIAL FIB:     Tolerates anticoagulation.  He has rate control.  No change in therapy.    Current medicines are reviewed at length with the patient today.  The patient does not have concerns regarding medicines.  The following changes have been made:  None  Labs/ tests ordered today include:    Orders Placed This Encounter  Procedures  . Basic Metabolic Panel (BMET)  . CK (Creatine Kinase)  . Magnesium  . TSH  . ECHOCARDIOGRAM COMPLETE     Disposition:   FU with me in 1 months.   Signed, [name], MD  01/28/2018 9:26 AM    [facility]

## 2018-01-26 NOTE — Telephone Encounter (Signed)
appt made 05/30 @ 8:40

## 2018-01-26 NOTE — Telephone Encounter (Signed)
Wife continue yell at [facility] ,   Ivin Booty RN spoke to wife- she still yelling and stating she only wants patient to see Dr [name].  RN Offered an appointment with extender tomorrow, refused. Stated she only wants to see Dr [name] , She was very adamant would not take the appointment. Aware will send message to [place]

## 2018-01-26 NOTE — Telephone Encounter (Signed)
He can be double booked on Thursday.

## 2018-01-26 NOTE — Telephone Encounter (Signed)
Agree with assessment. 

## 2018-01-26 NOTE — Telephone Encounter (Signed)
Received call from patient-patient states yesterday he had a bad muscle spasm that radiated to his chest area.   He states he took 2 muscle relaxers and pain medication, the pain was relieved and he went to bed.  He woke up this morning and when he got out of the bed he noticed he was sore.   He states he was quite active the day before cleaning the house (vacumningand preparing for people to come visit.   He has no other symptoms, CP, SOB, swelling, dizziness, N/V/D.   He wanted to run this by Dr. [name] and make sure he didn't think this was cardiac related.   Advised if chest is sore to touch, this is more likely to be musculoskeletal related but to continue to monitor and will make Dr. [name] aware.  Patient aware and verbalized understanding.

## 2018-01-28 ENCOUNTER — Encounter: Payer: Self-pay | Admitting: Cardiology

## 2018-01-28 ENCOUNTER — Ambulatory Visit (INDEPENDENT_AMBULATORY_CARE_PROVIDER_SITE_OTHER): Payer: Medicare HMO | Admitting: Cardiology

## 2018-01-28 VITALS — BP 130/70 | HR 65 | Ht 61.0 in | Wt 212.6 lb

## 2018-01-28 DIAGNOSIS — Z952 Presence of prosthetic heart valve: Secondary | ICD-10-CM

## 2018-01-28 DIAGNOSIS — Z79899 Other long term (current) drug therapy: Secondary | ICD-10-CM | POA: Diagnosis not present

## 2018-01-28 DIAGNOSIS — R5383 Other fatigue: Secondary | ICD-10-CM | POA: Diagnosis not present

## 2018-01-28 DIAGNOSIS — R0602 Shortness of breath: Secondary | ICD-10-CM

## 2018-01-28 DIAGNOSIS — M791 Myalgia, unspecified site: Secondary | ICD-10-CM

## 2018-01-28 LAB — BASIC METABOLIC PANEL
BUN / CREAT RATIO: 23 — AB (ref 9–20)
BUN: 19 mg/dL (ref 6–24)
CO2: 31 mmol/L — ABNORMAL HIGH (ref 20–29)
CREATININE: 0.82 mg/dL (ref 0.76–1.27)
Calcium: 7.8 mg/dL — ABNORMAL LOW (ref 8.7–10.2)
Chloride: 98 mmol/L (ref 96–106)
GFR calc non Af Amer: 99 mL/min/{1.73_m2} (ref >59)
GFR, EST AFRICAN AMERICAN: 114 mL/min/{1.73_m2} (ref >59)
Glucose: 85 mg/dL (ref 65–99)
Potassium: 4.3 mmol/L (ref 3.5–5.2)
Sodium: 140 mmol/L (ref 134–144)

## 2018-01-28 LAB — MAGNESIUM: Magnesium: 2 mg/dL (ref 1.6–2.3)

## 2018-01-28 LAB — TSH: TSH: 2.89 u[IU]/mL (ref 0.450–4.500)

## 2018-01-28 LAB — CK: Total CK: 51 U/L (ref 24–204)

## 2018-01-28 MED FILL — DIGOXIN 250 MCG TABLET: 250 | 30 days supply | Qty: 30 | Fill #2

## 2018-01-28 MED FILL — METHOCARBAMOL 500 MG TABLET: 500 | 90 days supply | Qty: 270 | Fill #0

## 2018-01-28 NOTE — Patient Instructions (Addendum)
Medication Instructions:  Continue current medications  If you need a refill on your cardiac medications before your next appointment, please call your pharmacy.  Labwork: BMP, TSH, Magnesium, CK HERE IN OUR OFFICE AT [facility]  Take the provided lab slips with you to the lab for your blood draw.   You will NOT need to fast   Testing/Procedures: Your physician has requested that you have an echocardiogram. Echocardiography is a painless test that uses sound waves to create images of your heart. It provides your doctor with information about the size and shape of your heart and how well your heart's chambers and valves are working. This procedure takes approximately one hour. There are no restrictions for this procedure.   Follow-Up: Your physician wants you to follow-up in: 1 Month.      Thank you for choosing [facility] at [facility]!!

## 2018-01-28 NOTE — Addendum Note (Signed)
Addended by: Vennie Homans on: 01/28/2018 01:25 PM   Modules accepted: Orders

## 2018-01-28 NOTE — Telephone Encounter (Signed)
PA faxed to [facility]

## 2018-02-03 ENCOUNTER — Ambulatory Visit (HOSPITAL_COMMUNITY): Payer: Medicare HMO | Attending: Cardiovascular Disease

## 2018-02-03 ENCOUNTER — Other Ambulatory Visit: Payer: Self-pay

## 2018-02-03 DIAGNOSIS — I2510 Atherosclerotic heart disease of native coronary artery without angina pectoris: Secondary | ICD-10-CM | POA: Diagnosis not present

## 2018-02-03 DIAGNOSIS — G4733 Obstructive sleep apnea (adult) (pediatric): Secondary | ICD-10-CM | POA: Diagnosis not present

## 2018-02-03 DIAGNOSIS — Z8673 Personal history of transient ischemic attack (TIA), and cerebral infarction without residual deficits: Secondary | ICD-10-CM | POA: Diagnosis not present

## 2018-02-03 DIAGNOSIS — Z952 Presence of prosthetic heart valve: Secondary | ICD-10-CM | POA: Diagnosis not present

## 2018-02-03 DIAGNOSIS — I358 Other nonrheumatic aortic valve disorders: Secondary | ICD-10-CM | POA: Diagnosis not present

## 2018-02-03 DIAGNOSIS — I361 Nonrheumatic tricuspid (valve) insufficiency: Secondary | ICD-10-CM | POA: Diagnosis not present

## 2018-02-03 DIAGNOSIS — I509 Heart failure, unspecified: Secondary | ICD-10-CM | POA: Insufficient documentation

## 2018-02-16 ENCOUNTER — Telehealth: Payer: Self-pay | Admitting: Cardiology

## 2018-02-16 MED ORDER — POTASSIUM CHLORIDE ER 10 MEQ PO TBCR: 20.0000 meq | EXTENDED_RELEASE_TABLET | Freq: Two times a day (BID) | ORAL | 0 refills | Status: DC

## 2018-02-16 NOTE — Telephone Encounter (Signed)
Pt calling    *STAT* If patient is at the pharmacy, call can be transferred to refill team.   1. Which medications need to be refilled? (please list name of each medication and dose if known) Potassium Chloride 10 MEQ tablets  2. Which pharmacy/location (including street and city if local pharmacy) is medication to be sent to?[facility] [employer]  3. Do they need a 30 day or 90 day supply? 90 day

## 2018-02-17 ENCOUNTER — Telehealth: Payer: Self-pay | Admitting: Cardiology

## 2018-02-17 ENCOUNTER — Ambulatory Visit (INDEPENDENT_AMBULATORY_CARE_PROVIDER_SITE_OTHER): Payer: Medicare HMO | Admitting: *Deleted

## 2018-02-17 DIAGNOSIS — I4891 Unspecified atrial fibrillation: Secondary | ICD-10-CM

## 2018-02-17 DIAGNOSIS — Z5181 Encounter for therapeutic drug level monitoring: Secondary | ICD-10-CM | POA: Diagnosis not present

## 2018-02-17 LAB — POCT INR: INR: 2.8 (ref 2.0–3.0)

## 2018-02-17 MED ORDER — POTASSIUM CHLORIDE ER 10 MEQ PO TBCR: 20.0000 meq | EXTENDED_RELEASE_TABLET | Freq: Two times a day (BID) | ORAL | 0 refills | Status: DC

## 2018-02-17 NOTE — Telephone Encounter (Signed)
New Message     *STAT* If patient is at the pharmacy, call can be transferred to refill team.   1. Which medications need to be refilled? (please list name of each medication and dose if known) potassium chloride (K-DUR) 10 MEQ tablet  2. Which pharmacy/location (including street and city if local pharmacy) is medication to be sent to?  [place] they need a 30 day or 90 day supply? 90   Patient is calling because his insurance will only cover a 90 day supply. He advised he only received medication for 30 days. He is requesting a 90 day prescription

## 2018-02-17 NOTE — Patient Instructions (Signed)
Description   Continue taking 1 tablet daily except 1.5 tablets on Sundays, Tuesdays and Thursdays.  Recheck INR 5 weeks.

## 2018-02-25 ENCOUNTER — Institutional Professional Consult (permissible substitution): Payer: Self-pay | Admitting: Internal Medicine

## 2018-02-25 NOTE — Progress Notes (Signed)
Cardiology Office Note   Date:  02/26/2018   ID:  Lance Cook, DOB [birth year], MRN [id number]  PCP:  [name], PA-C  Cardiologist:   [name], MD    Chief Complaint  Patient presents with  . Edema      History of Present Illness: Lance Cook is a 57 y.o. male who presents for follow up of valvular heart disease.   He has a past medical history of AVR (s/p Bentall procedure in 1988), severe mitral regurgitation (s/p St Jude mechanical valve placed in 12/2014), CAD (s/p SVG-RCA, known to be occluded by cath in 12/2014), chronic atrial fibrillation, chronic diastolic CHF (EF 67-89% by echo in 12/2015), prior CVA, chronic venous insufficiency, OSA (on CPAP)and recurrent C. difficile infection.   He had chest pain at the last visit.  This was atypical.    Since then he is continued to have cramping goes away with muscle relaxants.  He has had continued increased lower extremity swelling with a 12 pound weight gain.  He says he is been on his feet more than usual.  He does not describe any new shortness of breath, PND or orthopnea.  There have not been new palpitations, presyncope or syncope.   Past Medical History:  Diagnosis Date  . Acute on chronic diastolic CHF (congestive heart failure) ([place]) 08/27/2016  . Anemia   . Anxiety   . ARDS (adult respiratory distress syndrome) ([place]) 01/27/2015  . Arthritis   . Ascending aortic dissection ([place]) 07/14/2008   Localized dissection of ascending aorta noted on CTA in 2009 and stable on CTA in 2011  . Asymptomatic chronic venous hypertension 01/15/2010   Overview:  Overview:  Qualifier: Diagnosis of  By: [name] MD, [name]   Last Assessment & Plan:  I suggested he go to a vein clinic to see if he could be qualified compression hose of some type, since he is not a surgical candidate according to the vascular surgeons.   . Atrial fibrillation ([place])    chronic persistent  . Bell's palsy   . CAD (coronary artery disease)    Old  scar inferior wall myoview, 10/2009 EF 52%.  He did have previous SVG to RCA but no obstructive disease noted on his most recent cath.  SVG occluded.   . Cellulitis 04/02/2014  . Cerebral artery occlusion with cerebral infarction ([place]) 10/08/2011   Overview:  Overview:  And hx of TIA prior to CABG, all thought due to systemic emboli prior to coumadin   . Chronic diastolic heart failure ([place]) 03/01/2015   a. 12/2015: echo showing a preserved EF of 65-70%, moderate AS, and moderate TR.   Marland Kitchen Chronic LBP 10/08/2011  . CVA (cerebral vascular accident) ([place]) 10/08/2011   And hx of TIA prior to CABG, all thought due to systemic emboli prior to coumadin   . DIABETES MELLITUS, TYPE II 11/01/2009   Qualifier: Diagnosis of  By: [name] MD, [name]    . Diverticulosis   . 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 Encephalopathy acute 02/05/2015  . GERD (gastroesophageal reflux disease)    not needing medication at thhis time- 01/22/15  . Gout   . H/O mechanical aortic valve replacement 11/01/2009   Qualifier: Diagnosis of  By: [name], [name]    . Hiatal hernia   . History of colon polyps 12/23/2011   Overview:  Overview:  Colonoscopy January 2010, 6 mm rectal tubulovillous adenoma. No high-grade dysplasia   . HTN (hypertension) 02/23/2014  . Hyperlipidemia   . Hypertension   . Hypogonadism male 04/08/2012  . Impaired glucose tolerance 10/08/2011  . Morbid obesity ([place]) 04/02/2014  . Myocardial infarction [facility]) age 36  . OSA (obstructive sleep apnea)    CPAP  . Peripheral vascular disease ([place])   . 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 04/24/2352   33 mm St Jude bileaflet mechanical valve placed via right mini thoracotomy approach  . 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    1988 - Dr Blase Mess at [facility] in [place], [place]  . Severe mitral regurgitation 10/11/2014  . Testicular cancer [facility])    He was 46 y/o. He had surgical resection and rad tx's.   Marland Kitchen TIA (transient ischemic attack)    age 82  . Varicose veins   . 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 Venous (peripheral) insufficiency 02/03/2005    Past Surgical History:  Procedure Laterality Date  . aortic truck    . BENTALL PROCEDURE  1988   25 mm St Jude mechanical valve conduit - Dr Blase Mess at [facility] in [place], [place]  . CHOLECYSTECTOMY  2011  . COLONOSCOPY WITH PROPOFOL N/A 06/17/2016   Procedure: COLONOSCOPY WITH PROPOFOL;  Surgeon: Jerene Bears, MD;  Location: [facility] ENDOSCOPY;  Service: Gastroenterology;  Laterality: N/A;  . CORONARY ARTERY BYPASS GRAFT    . LASER ABLATION Left 03/06/2010   leg  . MITRAL VALVE REPLACEMENT Right 01/24/2015   Procedure: Re-Operation, MINIMALLY INVASIVE MITRAL VALVE (MV) REPLACEMENT.;  Surgeon: Rexene Alberts, MD;  Location: [place];  Service: Open Heart Surgery;  Laterality: Right;  . ORCHIECTOMY  age 15   testicular cancer  . OTOPLASTY     bilateral, age 37  . TEE WITHOUT CARDIOVERSION N/A 10/11/2014   Procedure: TRANSESOPHAGEAL ECHOCARDIOGRAM (TEE);  Surgeon: Pixie Casino, MD;  Location: [facility]COPY;  Service: Cardiovascular;  Laterality: N/A;  . TEE WITHOUT CARDIOVERSION N/A 01/24/2015   Procedure: TRANSESOPHAGEAL ECHOCARDIOGRAM (TEE);  Surgeon: Rexene Alberts, MD;  Location: [place];  Service: Open Heart Surgery;  Laterality: N/A;  . TONSILLECTOMY  1967  . TOOTH EXTRACTION  2016     Current Outpatient Medications  Medication Sig Dispense Refill  . acetaminophen (TYLENOL) 500 MG tablet Take 500 mg by mouth every 8 (eight) hours as needed for moderate pain or headache.     . allopurinol (ZYLOPRIM) 300 MG tablet Take 300 mg by mouth daily.    Marland Kitchen atenolol (TENORMIN) 25 MG tablet Take 1 tablet (25 mg  total) by mouth 2 (two) times daily.    . digoxin (LANOXIN) 0.25 MG tablet Take 1 tablet (250 mcg total) by mouth daily. 90 tablet 2  . enoxaparin (LOVENOX) 100 MG/ML injection Inject 1 mL (100 mg total) into the skin every 12 (twelve) hours. 20 Syringe 0  . magnesium oxide (MAG-OX) 400 (241.3 MG) MG tablet Take 1 tablet (400 mg total) by mouth 2 (two) times daily. 180 tablet 3  . methocarbamol (ROBAXIN) 500 MG tablet Take 1 tablet (500 mg total) by mouth 3 (three) times daily. 270 tablet 0  . oxyCODONE (OXY IR/ROXICODONE) 5 MG immediate release tablet Take 1 tablet (5 mg total) by mouth every 8 (eight) hours as needed for severe  pain. 90 tablet 0  . potassium chloride (K-DUR) 10 MEQ tablet Take 2 tablets (20 mEq total) by mouth 2 (two) times daily. 180 tablet 0  . traZODone (DESYREL) 50 MG tablet Take 2 tablets by mouth at bedtime.     Marland Kitchen warfarin (COUMADIN) 5 MG tablet Take 5-7.5 mg by mouth See admin instructions. Take 1 and 1/2 tablets on Sunday, Tuesday and Thursday then take 1 tablet all the other days    . torsemide (DEMADEX) 20 MG tablet Take 3 tablets (60 mg total) by mouth daily. 180 tablet 11   No current facility-administered medications for this visit.     Allergies:   Penicillins; Adhesive [tape]; and Bactrim [sulfamethoxazole-trimethoprim]    ROS:  Please see the history of present illness.   Otherwise, review of systems are positive for none.   All other systems are reviewed and negative.    PHYSICAL EXAM: VS:  BP 124/76   Pulse 68   Ht 5\' 1"  (1.549 m)   Wt 224 lb 3.2 oz (101.7 kg)   SpO2 97%   BMI 42.36 kg/m  , BMI Body mass index is 42.36 kg/m.  GENERAL:  Well appearing NECK:  No jugular venous distention, waveform within normal limits, carotid upstroke brisk and symmetric, no bruits, no thyromegaly LUNGS:  Clear to auscultation bilaterally CHEST:  Well healed surgical scars.   HEART:  PMI not displaced or sustained,S1 and S2 mechanical, no S3, no S4, no clicks, no  rubs, 3 of 6 holosystolic murmur at the left sternal boarder no diastolic murmurs ABD:  Flat, positive bowel sounds normal in frequency in pitch, no bruits, no rebound, no guarding, no midline pulsatile mass, no hepatomegaly, no splenomegaly EXT:  2 plus pulses throughout, severe edema to the knees, no cyanosis no clubbing   EKG:  EKG  is not ordered today.    Recent Labs: 01/14/2018: ALT 16; B Natriuretic Peptide 142.5; Hemoglobin 11.3; Platelets 241 01/28/2018: BUN 19; Creatinine, Ser 0.82; Magnesium 2.0; Potassium 4.3; Sodium 140; TSH 2.890    Lipid Panel    Component Value Date/Time   CHOL 118 08/18/2012 1137   TRIG 133.0 08/18/2012 1137   HDL 27.20 (L) 08/18/2012 1137   CHOLHDL 4 08/18/2012 1137   VLDL 26.6 08/18/2012 1137   LDLCALC 64 08/18/2012 1137      Wt Readings from Last 3 Encounters:  02/26/18 224 lb 3.2 oz (101.7 kg)  01/28/18 212 lb 9.6 oz (96.4 kg)  01/12/18 215 lb (97.5 kg)      Other studies Reviewed: Additional studies/ records that were reviewed today include: echo Review of the above records demonstrates:     ASSESSMENT AND PLAN:  MUSCLE PAIN:     This was not thought to be cardiac.  CK was normal.    This is treated with muscle relaxants.  I do not see any anginal etiology.  Needs to follow-up with his primary provider.  MVR:  This has been stable with the last echo in earlier this month.   AVR:   Again this was stable on echo as above.   HTN:  The blood pressure is at target. No change in medications is indicated. We will continue with therapeutic lifestyle changes (TLC).  EDEMA:     His swelling is increased and his weight is increased.  He says he is been on his feet more.  He is been taking the Lasix 80 mg twice daily.  I am to change to torsemide 60 mg twice  daily.  He will get a basic metabolic profile early next week.  Needs to be seen for follow-up.  The next step would likely be hospitalization.  ATRIAL FIB:     He tolerates  anticoagulation.  No change in therapy.    Current medicines are reviewed at length with the patient today.  The patient does not have concerns regarding medicines.  The following changes have been made:  As above  Labs/ tests ordered today include:   Orders Placed This Encounter  Procedures  . Basic Metabolic Panel (BMET)     Disposition:   FU with me APP next week.  The right with Signed, [name], MD  02/26/2018 11:52 AM    [place]

## 2018-02-26 ENCOUNTER — Ambulatory Visit (INDEPENDENT_AMBULATORY_CARE_PROVIDER_SITE_OTHER): Payer: Medicare HMO | Admitting: Cardiology

## 2018-02-26 ENCOUNTER — Encounter: Payer: Self-pay | Admitting: Cardiology

## 2018-02-26 VITALS — BP 124/76 | HR 68 | Ht 61.0 in | Wt 224.2 lb

## 2018-02-26 DIAGNOSIS — Z952 Presence of prosthetic heart valve: Secondary | ICD-10-CM | POA: Diagnosis not present

## 2018-02-26 DIAGNOSIS — M7989 Other specified soft tissue disorders: Secondary | ICD-10-CM

## 2018-02-26 DIAGNOSIS — Z79899 Other long term (current) drug therapy: Secondary | ICD-10-CM

## 2018-02-26 MED ORDER — TORSEMIDE 20 MG PO TABS: 60.0000 mg | ORAL_TABLET | Freq: Every day | ORAL | 11 refills | Status: DC

## 2018-02-26 NOTE — Patient Instructions (Signed)
Medication Instructions:  STOP- Lasix START- Torsemide 60 mg twice a day  If you need a refill on your cardiac medications before your next appointment, please call your pharmacy.  Labwork: BMP on Tuesday HERE IN OUR OFFICE AT [facility]  Take the provided lab slips with you to the lab for your blood draw.   You will NOT need to fast   Testing/Procedures: None Ordered   Follow-Up: Your physician wants you to follow-up in: 1 Week with APP.      Thank you for choosing [facility]!!

## 2018-02-28 ENCOUNTER — Telehealth: Payer: Self-pay | Admitting: Cardiology

## 2018-02-28 NOTE — Telephone Encounter (Signed)
Pt called complaining of "spasms" - sounded like cramping. His diuretics have recently been changed. He asked if I could refill his oxycodone early, I explained that was not something I could do. He does have a PCP and goes to a pain clinic and I suggested he contact them.  Kerin Ransom PA-C 02/28/2018 3:19 PM

## 2018-03-01 ENCOUNTER — Other Ambulatory Visit: Payer: Medicare HMO | Admitting: *Deleted

## 2018-03-01 MED FILL — DIGOXIN 250 MCG TABLET: 250 | 30 days supply | Qty: 30 | Fill #0

## 2018-03-02 LAB — BASIC METABOLIC PANEL
BUN/Creatinine Ratio: 20 (ref 9–20)
BUN: 18 mg/dL (ref 6–24)
CHLORIDE: 100 mmol/L (ref 96–106)
CO2: 28 mmol/L (ref 20–29)
CREATININE: 0.89 mg/dL (ref 0.76–1.27)
Calcium: 8 mg/dL — ABNORMAL LOW (ref 8.7–10.2)
GFR calc Af Amer: 110 mL/min/{1.73_m2} (ref >59)
GFR calc non Af Amer: 96 mL/min/{1.73_m2} (ref >59)
Glucose: 93 mg/dL (ref 65–99)
POTASSIUM: 4.1 mmol/L (ref 3.5–5.2)
SODIUM: 143 mmol/L (ref 134–144)

## 2018-03-03 NOTE — Progress Notes (Signed)
Cardiology Office Note:    Date:  03/08/2018   ID:  Lance Cook, DOB September 14, 1960, MRN [id number]  PCP:  [name], PA-C  Cardiologist:  [name], MD   Referring MD: [name], PA-C   Chief Complaint  Patient presents with  . Edema    gential, upper/lower legs, ankles.   . Spasms    History of Present Illness:    Lance Cook is a 57 y.o. male with a hx of valvular heart disease and is s/p Bentall procedure in 1988, severe mitral regurgitation (s/p St Junde mechanical valve 12/2014), prior CVA, HTN, chronic diastolic heart failure, CAD (s/p SVG-RCA, known to be occluded by cath in 12/2014), chronic atrial fibrillation, chronic venous insufficiency, OSA on CPAP, recurrent C diff infection, and chronic pain (goes to pain clinic).   He last saw Dr. [name] in clinic on 02/26/18. He complains of cramping relieved by muscle relaxants. CK was normal, anginal etiology was not suspected. He also complained of a 12lb weight gain and lower extremity swelling. He was on lasix 80 mg BID. Dr. [name] transitioned him to 60 mg torsemide BID. This change was an attempt to keep him out of the hospital. The patient called two days later requesting pain medication and was referred back to his PCP and pain clinic.   He presents today for follow up after transitioning to torsemide. He still has lower extremity swelling with edema up to his thighs. He reports no change in weight since last visit.  He reports genital welling.  He at baseline lays on a wedge at night to sleep for OSA.  He reports increased dyspnea on exertion since starting the torsemide.  He can now no longer walk to the bathroom from his bed without getting short of breath.  He also reports severe whole body cramping since starting the torsemide.  Essentially he has not improved on the torsemide and may even be getting worse from a congestive heart failure standpoint.  We had a long discussion about her hospitalization starting today.   I do not think that we can achieve euvolemia as an outpatient.  I think that he needs to be hospitalized for IV diuresis.  The patient declines at this time stating he has a fishing trip planned for this weekend.  He wants to defer hospitalization until after his trip.  We then discussed a plan for changing his diuretic, as below. We also reviewed when to go to the ER.  The patient and wife also request a motorized scooter for mobility.  I will defer at this time stating that I want him as physically active as he can be.  I suspect he will be able to increase his physical activity as we take fluid off.  Past Medical History:  Diagnosis Date  . Acute on chronic diastolic CHF (congestive heart failure) ([place]) 08/27/2016  . Anemia   . Anxiety   . ARDS (adult respiratory distress syndrome) ([place]) 01/27/2015  . Arthritis   . Ascending aortic dissection ([place]) 07/14/2008   Localized dissection of ascending aorta noted on CTA in 2009 and stable on CTA in 2011  . Asymptomatic chronic venous hypertension 01/15/2010   Overview:  Overview:  Qualifier: Diagnosis of  By: [name] MD, [name]   Last Assessment & Plan:  I suggested he go to a vein clinic to see if he could be qualified compression hose of some type, since he is not a surgical candidate according to the vascular surgeons.   Marland Kitchen  Atrial fibrillation (HCC)    chronic persistent  . Bell's palsy   . C. difficile diarrhea   . CAD (coronary artery disease)    Old scar inferior wall myoview, 10/2009 EF 52%.  He did have previous SVG to RCA but no obstructive disease noted on his most recent cath.  SVG occluded.   . Cellulitis 04/02/2014  . Cerebral artery occlusion with cerebral infarction ([place]) 10/08/2011   Overview:  Overview:  And hx of TIA prior to CABG, all thought due to systemic emboli prior to coumadin   . Chronic diastolic heart failure ([place]) 03/01/2015   a. 12/2015: echo showing a preserved EF of 65-70%, moderate AS, and moderate TR.   Marland Kitchen Chronic  LBP 10/08/2011  . CVA (cerebral vascular accident) ([place]) 10/08/2011   And hx of TIA prior to CABG, all thought due to systemic emboli prior to coumadin   . DIABETES MELLITUS, TYPE II 11/01/2009   Qualifier: Diagnosis of  By: [name] MD, [name]    . Diverticulosis   . 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 Encephalopathy acute 02/05/2015  . Gastric polyps   . GERD (gastroesophageal reflux disease)    not needing medication at thhis time- 01/22/15  . Gout   . H/O mechanical aortic valve replacement 11/01/2009   Qualifier: Diagnosis of  By: [name], [name]    . Hiatal hernia   . History of colon polyps 12/23/2011   Overview:  Overview:  Colonoscopy January 2010, 6 mm rectal tubulovillous adenoma. No high-grade dysplasia   . HTN (hypertension) 02/23/2014  . Hyperlipidemia   . Hypertension   . Hypogonadism male 04/08/2012  . Impaired glucose tolerance 10/08/2011  . Morbid obesity ([place]) 04/02/2014  . Myocardial infarction [facility]) age 21  . OSA (obstructive sleep apnea)    CPAP  . Peripheral vascular disease ([place])   . 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 3/76/2831   33 mm St Jude bileaflet mechanical valve placed via right mini thoracotomy approach  . 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    1988 - Dr Blase Mess at [facility] in [place], [place]  . Severe mitral regurgitation 10/11/2014  . Testicular cancer [facility])    He was 57 y/o. He had surgical resection and rad tx's.   Marland Kitchen TIA (transient ischemic attack)    age 38  . Tubulovillous adenoma of colon   . Varicose veins   . 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 Venous (peripheral) insufficiency 02/03/2005    Past Surgical History:  Procedure Laterality Date  . aortic truck    . BENTALL PROCEDURE  1988   25 mm St  Jude mechanical valve conduit - Dr Blase Mess at [facility] in [place], [place]  . CHOLECYSTECTOMY  2011  . COLONOSCOPY WITH PROPOFOL N/A 06/17/2016   Procedure: COLONOSCOPY WITH PROPOFOL;  Surgeon: Jerene Bears, MD;  Location: [facility] ENDOSCOPY;  Service: Gastroenterology;  Laterality: N/A;  . CORONARY ARTERY BYPASS GRAFT    . LASER ABLATION Left 03/06/2010   leg  . MITRAL VALVE REPLACEMENT Right 01/24/2015   Procedure: Re-Operation, MINIMALLY INVASIVE MITRAL VALVE (MV) REPLACEMENT.;  Surgeon: Rexene Alberts, MD;  Location: [facility];  Service: Open Heart Surgery;  Laterality: Right;  . ORCHIECTOMY  age 84   testicular cancer  . OTOPLASTY     bilateral, age 26  . TEE WITHOUT CARDIOVERSION N/A 10/11/2014   Procedure: TRANSESOPHAGEAL ECHOCARDIOGRAM (TEE);  Surgeon: Pixie Casino, MD;  Location: [facility] ENDOSCOPY;  Service: Cardiovascular;  Laterality: N/A;  . TEE WITHOUT CARDIOVERSION N/A 01/24/2015   Procedure: TRANSESOPHAGEAL ECHOCARDIOGRAM (TEE);  Surgeon: Rexene Alberts, MD;  Location: [facility];  Service: Open Heart Surgery;  Laterality: N/A;  . TONSILLECTOMY  1967  . TOOTH EXTRACTION  2016    Current Medications: Current Meds  Medication Sig  . acetaminophen (TYLENOL) 500 MG tablet Take 500 mg by mouth every 8 (eight) hours as needed for moderate pain or headache.   . allopurinol (ZYLOPRIM) 300 MG tablet Take 300 mg by mouth daily.  Marland Kitchen atenolol (TENORMIN) 25 MG tablet Take 1 tablet (25 mg total) by mouth 2 (two) times daily.  . digoxin (LANOXIN) 0.25 MG tablet Take 1 tablet (250 mcg total) by mouth daily.  Marland Kitchen enoxaparin (LOVENOX) 100 MG/ML injection Inject 1 mL (100 mg total) into the skin every 12 (twelve) hours.  . magnesium oxide (MAG-OX) 400 (241.3 MG) MG tablet Take 1 tablet (400 mg total) by mouth 2 (two) times daily.  . methocarbamol (ROBAXIN) 500 MG tablet Take 1 tablet (500 mg total) by mouth 3 (three) times daily.  . potassium chloride  (K-DUR) 10 MEQ tablet Take 2 tablets (20 mEq total) by mouth 2 (two) times daily.  . traZODone (DESYREL) 50 MG tablet Take 2 tablets by mouth at bedtime.   Marland Kitchen warfarin (COUMADIN) 5 MG tablet Take 5-7.5 mg by mouth See admin instructions. Take 1 and 1/2 tablets on Sunday, Tuesday and Thursday then take 1 tablet all the other days  . [DISCONTINUED] torsemide (DEMADEX) 20 MG tablet Take 3 tablets (60 mg total) by mouth daily.     Allergies:   Penicillins; Adhesive [tape]; and Bactrim [sulfamethoxazole-trimethoprim]   Social History   Socioeconomic History  . Marital status: Married    Spouse name: Not on file  . Number of children: 0  . Years of education: Not on file  . Highest education level: Not on file  Occupational History  . Occupation: Disabled Environmental education officer: UNEMPLOYED  Social Needs  . Financial resource strain: Not on file  . Food insecurity:    Worry: Not on file    Inability: Not on file  . Transportation needs:    Medical: Not on file    Non-medical: Not on file  Tobacco Use  . Smoking status: Light Tobacco Smoker    Types: Cigars  . Smokeless tobacco: Never Used  . Tobacco comment: about 3 yearly- cigar  Substance and Sexual Activity  . Alcohol use: Yes    Alcohol/week: 0.0 oz    Comment: social  . Drug use: No  . Sexual activity: Not on file  Lifestyle  . Physical activity:    Days per week: Not on file    Minutes per session: Not on file  . Stress: Not on file  Relationships  . Social connections:    Talks on phone: Not on file    Gets together: Not on file    Attends religious service: Not on file    Active member of club or organization: Not on file    Attends meetings of clubs or organizations: Not on file    Relationship status: Not on file  Other Topics Concern  . Not on file  Social History  Narrative  . Not on file     Family History: The patient's family history includes Cancer in his maternal aunt and maternal uncle; Diabetes in his  father; Heart disease in his mother; Hypertension in his other; Other in his mother; Stroke in his other; Throat cancer in his mother. There is no history of Colon cancer.  ROS:   Please see the history of present illness.     All other systems reviewed and are negative.  EKGs/Labs/Other Studies Reviewed:    The following studies were reviewed today:  Echo 02/03/18: Study Conclusions - Left ventricle: The cavity size was normal. Wall thickness was   increased in a pattern of mild LVH. Systolic function was normal.    The estimated ejection fraction was in the range of 55% to 60%. - Aortic valve: Mechanical St Jude AVR with stable gradients since   2017 and no significant peri valvular regurgitation. - Mitral valve: Normal appearing mechanical St Jude MVR. - Left atrium: The atrium was severely dilated. - Atrial septum: No defect or patent foramen ovale was identified. - Tricuspid valve: There was moderate regurgitation. - Pulmonary arteries: PA peak pressure: 64 mm Hg (S).   EKG:  EKG is not ordered today.    Recent Labs: 01/14/2018: ALT 16; B Natriuretic Peptide 142.5; Hemoglobin 11.3; Platelets 241 01/28/2018: Magnesium 2.0; TSH 2.890 03/01/2018: BUN 18; Creatinine, Ser 0.89; Potassium 4.1; Sodium 143  Recent Lipid Panel    Component Value Date/Time   CHOL 118 08/18/2012 1137   TRIG 133.0 08/18/2012 1137   HDL 27.20 (L) 08/18/2012 1137   CHOLHDL 4 08/18/2012 1137   VLDL 26.6 08/18/2012 1137   LDLCALC 64 08/18/2012 1137    Physical Exam:    VS:  BP 116/67   Pulse 65   Ht 5\' 1"  (1.549 m)   Wt 224 lb 12.8 oz (102 kg)   BMI 42.48 kg/m     Wt Readings from Last 3 Encounters:  03/08/18 224 lb 12.8 oz (102 kg)  02/26/18 224 lb 3.2 oz (101.7 kg)  01/28/18 212 lb 9.6 oz (96.4 kg)     GEN: Well nourished, well developed in no acute distress HEENT: Normal NECK: No JVD; No carotid bruits CARDIAC: RRR, + murmur RESPIRATORY:  Clear to auscultation without rales, wheezing or  rhonchi  ABDOMEN: Soft, non-tender, non-distended MUSCULOSKELETAL:  Severe lower extremity edema up to his knees, chronic skin changes, weeping noted on left lower extremity, no open wounds  NEUROLOGIC:  Alert and oriented x 3 PSYCHIATRIC:  Normal affect   ASSESSMENT:    1. Acute on chronic diastolic heart failure (HCC)   2. Leg swelling   3. Lower extremity edema   4. Chronic atrial fibrillation (HCC)   5. Essential hypertension   6. Medication management   7. Hypocalcemia    PLAN:    In order of problems listed above:  Acute on chronic diastolic heart failure (HCC) Leg swelling - Plan: Basic metabolic panel Lower extremity edema Patient presents an acute on chronic diastolic heart failure.  He is hypervolemic on exam.  He has not had any change in weight or lower extremity swelling on the torsemide.  He is now more short of breath with exertion, unable to walk from the bed to the bathroom without being short of breath.  He sleeps on a wedge at baseline at night.  His only complaint while sleeping is severe leg cramping.  I recommended hospitalization for IV diuresis starting today.  The patient  is currently refusing hospitalization at this time, choosing to defer until after his fishing trip this weekend.  We reviewed when to go to the ER when out of town.  An attempt to decrease his volume status, we will go back to 80 mg of Lasix twice daily with 5 mg of Zaroxolyn for 3 days.  He will return on Thursday, 03/11/2018 to be seen again and for repeat labs.  I do not think that the severe cramping is due to torsemide as his potassium has been normal.  He has low calcium.  I suggested that he follow-up with his PCP for this.  I stressed that I fear he is tenuous at best.  He expresses understanding that he is deferring my recommendation for hospitalization today.  We will collect a BMP today prior to starting Zaroxolyn.  I also stressed the importance of avoiding injury to his lower extremities  as the integrity of his skin is compromised given his severe edema.  Chronic atrial fibrillation (HCC) Stable. No bleeding on coumadin.  Essential hypertension Pressure well-controlled today.  Medication management - Plan: Basic metabolic panel Medications as above. BMP today. Follow up in 4 days.  Hypocalcemia Last Ca was 8.0. I think this is the source of his cramping. Will collect a BMP today. I referred him back to his PCP for management of his Ca.    Close follow up with repeat labs on 03/11/18. Reviewed when to go to the ER.   Medication Adjustments/Labs and Tests Ordered: Current medicines are reviewed at length with the patient today.  Concerns regarding medicines are outlined above.  Orders Placed This Encounter  Procedures  . Basic metabolic panel   Meds ordered this encounter  Medications  . furosemide (LASIX) 80 MG tablet    Sig: Take 1 tablet (80 mg total) by mouth 2 (two) times daily.    Dispense:  60 tablet    Refill:  1  . metolazone (ZAROXOLYN) 5 MG tablet    Sig: Take one tablet in the morning on Monday, Tuesday and Wednesday.    Dispense:  14 tablet    Refill:  0    Signed, Ledora Bottcher, [place]  03/08/2018 11:16 AM    [facility]

## 2018-03-05 ENCOUNTER — Encounter: Payer: Self-pay | Admitting: *Deleted

## 2018-03-08 ENCOUNTER — Ambulatory Visit (INDEPENDENT_AMBULATORY_CARE_PROVIDER_SITE_OTHER): Payer: Medicare HMO | Admitting: Physician Assistant

## 2018-03-08 ENCOUNTER — Encounter: Payer: Self-pay | Admitting: Physician Assistant

## 2018-03-08 VITALS — BP 116/67 | HR 65 | Ht 61.0 in | Wt 224.8 lb

## 2018-03-08 DIAGNOSIS — I5033 Acute on chronic diastolic (congestive) heart failure: Secondary | ICD-10-CM

## 2018-03-08 DIAGNOSIS — Z79899 Other long term (current) drug therapy: Secondary | ICD-10-CM

## 2018-03-08 DIAGNOSIS — M7989 Other specified soft tissue disorders: Secondary | ICD-10-CM

## 2018-03-08 DIAGNOSIS — R600 Localized edema: Secondary | ICD-10-CM | POA: Diagnosis not present

## 2018-03-08 DIAGNOSIS — I482 Chronic atrial fibrillation, unspecified: Secondary | ICD-10-CM

## 2018-03-08 DIAGNOSIS — I10 Essential (primary) hypertension: Secondary | ICD-10-CM

## 2018-03-08 MED ORDER — METOLAZONE 5 MG PO TABS: ORAL_TABLET | ORAL | 0 refills | Status: DC

## 2018-03-08 MED ORDER — FUROSEMIDE 80 MG PO TABS: 80.0000 mg | ORAL_TABLET | Freq: Two times a day (BID) | ORAL | 1 refills | Status: DC

## 2018-03-08 NOTE — Patient Instructions (Addendum)
Medication Instructions: START Furosemide 80 mg twice daily START Metolazone 5 mg in the morning on Monday, Tuesday and Wednesday STOP the Torsemide   If you need a refill on your cardiac medications before your next appointment, please call your pharmacy.   Labwork: Your provider would like for you to have the following labs today: BMET  Follow-Up: Your physician wants you to follow-up on Thursday at 1:30 with Almyra Deforest, PA   Thank you for choosing [facility]!!

## 2018-03-09 LAB — BASIC METABOLIC PANEL
BUN/Creatinine Ratio: 21 — ABNORMAL HIGH (ref 9–20)
BUN: 19 mg/dL (ref 6–24)
CALCIUM: 8.1 mg/dL — AB (ref 8.7–10.2)
CO2: 31 mmol/L — AB (ref 20–29)
Chloride: 98 mmol/L (ref 96–106)
Creatinine, Ser: 0.9 mg/dL (ref 0.76–1.27)
GFR, EST AFRICAN AMERICAN: 110 mL/min/{1.73_m2} (ref >59)
GFR, EST NON AFRICAN AMERICAN: 95 mL/min/{1.73_m2} (ref >59)
Glucose: 85 mg/dL (ref 65–99)
POTASSIUM: 3.9 mmol/L (ref 3.5–5.2)
SODIUM: 142 mmol/L (ref 134–144)

## 2018-03-11 ENCOUNTER — Ambulatory Visit (INDEPENDENT_AMBULATORY_CARE_PROVIDER_SITE_OTHER): Payer: Medicare HMO | Admitting: Physician Assistant

## 2018-03-11 ENCOUNTER — Encounter: Payer: Self-pay | Admitting: Physician Assistant

## 2018-03-11 VITALS — BP 118/63 | HR 68 | Ht 61.0 in | Wt 205.4 lb

## 2018-03-11 DIAGNOSIS — Z952 Presence of prosthetic heart valve: Secondary | ICD-10-CM

## 2018-03-11 DIAGNOSIS — G4733 Obstructive sleep apnea (adult) (pediatric): Secondary | ICD-10-CM

## 2018-03-11 DIAGNOSIS — I5032 Chronic diastolic (congestive) heart failure: Secondary | ICD-10-CM | POA: Diagnosis not present

## 2018-03-11 DIAGNOSIS — I25810 Atherosclerosis of coronary artery bypass graft(s) without angina pectoris: Secondary | ICD-10-CM

## 2018-03-11 DIAGNOSIS — I482 Chronic atrial fibrillation, unspecified: Secondary | ICD-10-CM

## 2018-03-11 DIAGNOSIS — Z8673 Personal history of transient ischemic attack (TIA), and cerebral infarction without residual deficits: Secondary | ICD-10-CM

## 2018-03-11 DIAGNOSIS — Z9989 Dependence on other enabling machines and devices: Secondary | ICD-10-CM

## 2018-03-11 MED ORDER — METOLAZONE 5 MG PO TABS: 5.0000 mg | ORAL_TABLET | ORAL | 0 refills | Status: DC

## 2018-03-11 NOTE — Progress Notes (Signed)
Cardiology Office Note    Date:  03/13/2018   ID:  Lance Cook, DOB 05-07-1961, MRN [id number]  PCP:  [name], PA-C  Cardiologist:  Dr. [name]   Chief Complaint  Patient presents with  . Follow-up    seen for Dr. [name].     History of Present Illness:  Lance Cook is a 57 y.o. male with PMH of AVR (s/p Bentall procedure in 1988), severe mitral regurgitation (s/p St Jude mechanical valve placed 12/2014), CAD s/p SVG-RCA which is known to be occluded by cath in April 2016, chronic atrial fibrillation, chronic diastolic heart failure with baseline EF 65 to 70%, prior CVA, chronic venous insufficiency, obstructive sleep apnea on CPAP and recurrent C. difficile infection.  Patient was last seen by Dr. [name] on 02/26/2018 at which time he has had continued increased lower extremity swelling with 5 pound weight gain.  He also had cramps as well which is relieved by muscle relaxant.  CK was normal.  Her diuretic was switched from 80 mg twice daily of Lasix to 60 mg twice daily of torsemide.  He was seen back by Fabian Sharp PA-C on 03/08/2018, at which time he continued to have worsening lower extremity edema.  It appears Lance Cook has recommended the patient to be admitted to the hospital for IV diuresis, however patient declined to do so because of upcoming fishing trip.  He was reverted back to 80 mg twice daily of Lasix with additional 5 mg Zaroxolyn for 3 days.  He presents today along with his wife.  In matter 3 days, he has lost roughly 20 pounds of weight.  He has lower extremity edema has significantly improved.  I will switch Zaroxolyn to once weekly dosing.  He likely will continue to have at least 1-2+ pitting edema in bilateral lower extremity.  Otherwise he denies any significant chest discomfort.  His Coumadin is being monitored and managed by primary care provider.   Past Medical History:  Diagnosis Date  . Acute on chronic diastolic CHF (congestive heart failure) ([place])  08/27/2016  . Anemia   . Anxiety   . ARDS (adult respiratory distress syndrome) ([place]) 01/27/2015  . Arthritis   . Ascending aortic dissection ([place]) 07/14/2008   Localized dissection of ascending aorta noted on CTA in 2009 and stable on CTA in 2011  . Asymptomatic chronic venous hypertension 01/15/2010   Overview:  Overview:  Qualifier: Diagnosis of  By: [name] MD, [name]   Last Assessment & Plan:  I suggested he go to a vein clinic to see if he could be qualified compression hose of some type, since he is not a surgical candidate according to the vascular surgeons.   . Atrial fibrillation ([place])    chronic persistent  . Bell's palsy   . C. difficile diarrhea   . CAD (coronary artery disease)    Old scar inferior wall myoview, 10/2009 EF 52%.  He did have previous SVG to RCA but no obstructive disease noted on his most recent cath.  SVG occluded.   . Cellulitis 04/02/2014  . Cerebral artery occlusion with cerebral infarction ([place]) 10/08/2011   Overview:  Overview:  And hx of TIA prior to CABG, all thought due to systemic emboli prior to coumadin   . Chronic diastolic heart failure ([place]) 03/01/2015   a. 12/2015: echo showing a preserved EF of 65-70%, moderate AS, and moderate TR.   Lance Cook Chronic LBP 10/08/2011  . CVA (cerebral vascular accident) ([place]) 10/08/2011   And  hx of TIA prior to CABG, all thought due to systemic emboli prior to coumadin   . DIABETES MELLITUS, TYPE II 11/01/2009   Qualifier: Diagnosis of  By: [name] MD, [name]    . Diverticulosis   . 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 Encephalopathy acute 02/05/2015  . Gastric polyps   . GERD (gastroesophageal reflux disease)    not needing medication at thhis time- 01/22/15  . Gout   . H/O mechanical aortic valve replacement 11/01/2009   Qualifier: Diagnosis of  By: [name], [name]    . Hiatal hernia    . History of colon polyps 12/23/2011   Overview:  Overview:  Colonoscopy January 2010, 6 mm rectal tubulovillous adenoma. No high-grade dysplasia   . HTN (hypertension) 02/23/2014  . Hyperlipidemia   . Hypertension   . Hypogonadism male 04/08/2012  . Impaired glucose tolerance 10/08/2011  . Morbid obesity ([place]) 04/02/2014  . Myocardial infarction [facility]) age 56  . OSA (obstructive sleep apnea)    CPAP  . Peripheral vascular disease ([place])   . 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 1/61/0960   33 mm St Jude bileaflet mechanical valve placed via right mini thoracotomy approach  . 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    1988 - Dr Blase Mess at [facility] in [place], [place]  . Severe mitral regurgitation 10/11/2014  . Testicular cancer [facility])    He was 57 y/o. He had surgical resection and rad tx's.   Lance Cook TIA (transient ischemic attack)    age 80  . Tubulovillous adenoma of colon   . Varicose veins   . 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 Venous (peripheral) insufficiency 02/03/2005    Past Surgical History:  Procedure Laterality Date  . aortic truck    . BENTALL PROCEDURE  1988   25 mm St Jude mechanical valve conduit - Dr Blase Mess at [facility] in [place], [place]  . CHOLECYSTECTOMY  2011  . COLONOSCOPY WITH PROPOFOL N/A 06/17/2016   Procedure: COLONOSCOPY WITH PROPOFOL;  Surgeon: Jerene Bears, MD;  Location: [facility] ENDOSCOPY;  Service: Gastroenterology;  Laterality: N/A;  . CORONARY ARTERY BYPASS GRAFT    . LASER ABLATION Left 03/06/2010   leg  . MITRAL VALVE REPLACEMENT Right 01/24/2015   Procedure: Re-Operation, MINIMALLY INVASIVE MITRAL VALVE (MV) REPLACEMENT.;  Surgeon: Rexene Alberts, MD;  Location: [facility];  Service: Open Heart Surgery;  Laterality: Right;  . ORCHIECTOMY  age 8   testicular cancer  . OTOPLASTY     bilateral, age 47    . TEE WITHOUT CARDIOVERSION N/A 10/11/2014   Procedure: TRANSESOPHAGEAL ECHOCARDIOGRAM (TEE);  Surgeon: Pixie Casino, MD;  Location: [facility] ENDOSCOPY;  Service: Cardiovascular;  Laterality: N/A;  . TEE WITHOUT CARDIOVERSION N/A 01/24/2015   Procedure: TRANSESOPHAGEAL ECHOCARDIOGRAM (TEE);  Surgeon: Rexene Alberts, MD;  Location: [facility];  Service: Open Heart Surgery;  Laterality: N/A;  . TONSILLECTOMY  1967  . TOOTH EXTRACTION  2016    Current Medications: Outpatient Medications Prior to Visit  Medication Sig Dispense Refill  . acetaminophen (TYLENOL) 500 MG tablet Take 500 mg by mouth every 8 (eight) hours as needed for moderate pain or headache.     . allopurinol (ZYLOPRIM) 300 MG  tablet Take 300 mg by mouth daily.    Lance Cook atenolol (TENORMIN) 25 MG tablet Take 1 tablet (25 mg total) by mouth 2 (two) times daily.    . digoxin (LANOXIN) 0.25 MG tablet Take 1 tablet (250 mcg total) by mouth daily. 90 tablet 2  . enoxaparin (LOVENOX) 100 MG/ML injection Inject 1 mL (100 mg total) into the skin every 12 (twelve) hours. 20 Syringe 0  . furosemide (LASIX) 80 MG tablet Take 1 tablet (80 mg total) by mouth 2 (two) times daily. 60 tablet 1  . magnesium oxide (MAG-OX) 400 (241.3 MG) MG tablet Take 1 tablet (400 mg total) by mouth 2 (two) times daily. 180 tablet 3  . methocarbamol (ROBAXIN) 500 MG tablet Take 1 tablet (500 mg total) by mouth 3 (three) times daily. 270 tablet 0  . potassium chloride (K-DUR) 10 MEQ tablet Take 2 tablets (20 mEq total) by mouth 2 (two) times daily. 180 tablet 0  . traZODone (DESYREL) 50 MG tablet Take 2 tablets by mouth at bedtime.     Lance Cook warfarin (COUMADIN) 5 MG tablet Take 5-7.5 mg by mouth See admin instructions. Take 1 and 1/2 tablets on Sunday, Tuesday and Thursday then take 1 tablet all the other days    . metolazone (ZAROXOLYN) 5 MG tablet Take one tablet in the morning on Monday, Tuesday and Wednesday. 14 tablet 0  . oxyCODONE (OXY IR/ROXICODONE) 5 MG immediate release  tablet Take 1 tablet (5 mg total) by mouth every 8 (eight) hours as needed for severe pain. 90 tablet 0   No facility-administered medications prior to visit.      Allergies:   Penicillins; Adhesive [tape]; and Bactrim [sulfamethoxazole-trimethoprim]   Social History   Socioeconomic History  . Marital status: Married    Spouse name: Not on file  . Number of children: 0  . Years of education: Not on file  . Highest education level: Not on file  Occupational History  . Occupation: Disabled Environmental education officer: UNEMPLOYED  Social Needs  . Financial resource strain: Not on file  . Food insecurity:    Worry: Not on file    Inability: Not on file  . Transportation needs:    Medical: Not on file    Non-medical: Not on file  Tobacco Use  . Smoking status: Light Tobacco Smoker    Types: Cigars  . Smokeless tobacco: Never Used  . Tobacco comment: about 3 yearly- cigar  Substance and Sexual Activity  . Alcohol use: Yes    Alcohol/week: 0.0 oz    Comment: social  . Drug use: No  . Sexual activity: Not on file  Lifestyle  . Physical activity:    Days per week: Not on file    Minutes per session: Not on file  . Stress: Not on file  Relationships  . Social connections:    Talks on phone: Not on file    Gets together: Not on file    Attends religious service: Not on file    Active member of club or organization: Not on file    Attends meetings of clubs or organizations: Not on file    Relationship status: Not on file  Other Topics Concern  . Not on file  Social History Narrative  . Not on file     Family History:  The patient's family history includes Cancer in his maternal aunt and maternal uncle; Diabetes in his father; Heart disease in his mother; Hypertension in his other;  Other in his mother; Stroke in his other; Throat cancer in his mother.   ROS:   Please see the history of present illness.    ROS All other systems reviewed and are negative.   PHYSICAL EXAM:     VS:  BP 118/63   Pulse 68   Ht 5\' 1"  (1.549 m)   Wt 205 lb 6.4 oz (93.2 kg)   BMI 38.81 kg/m    GEN: Well nourished, well developed, in no acute distress  HEENT: normal  Neck: no JVD, carotid bruits, or masses Cardiac: RRR. 3/6 systolic murmur at apex. Mechanical clicks Respiratory:  clear to auscultation bilaterally, normal work of breathing GI: soft, nontender, nondistended, + BS MS: no deformity or atrophy  Skin: LE woody appearance, 1-2+ pitting edema Neuro:  Alert and Oriented x 3, Strength and sensation are intact Psych: euthymic mood, full affect  Wt Readings from Last 3 Encounters:  03/11/18 205 lb 6.4 oz (93.2 kg)  03/08/18 224 lb 12.8 oz (102 kg)  02/26/18 224 lb 3.2 oz (101.7 kg)      Studies/Labs Reviewed:   EKG:  EKG is not ordered today.    Recent Labs: 01/14/2018: ALT 16; B Natriuretic Peptide 142.5; Hemoglobin 11.3; Platelets 241 01/28/2018: Magnesium 2.0; TSH 2.890 03/11/2018: BUN 19; Creatinine, Ser 0.87; Potassium 3.3; Sodium 137   Lipid Panel    Component Value Date/Time   CHOL 118 08/18/2012 1137   TRIG 133.0 08/18/2012 1137   HDL 27.20 (L) 08/18/2012 1137   CHOLHDL 4 08/18/2012 1137   VLDL 26.6 08/18/2012 1137   LDLCALC 64 08/18/2012 1137    Additional studies/ records that were reviewed today include:   Echo 02/03/2018 LV EF: 55% -   60% Study Conclusions  - Left ventricle: The cavity size was normal. Wall thickness was   increased in a pattern of mild LVH. Systolic function was normal.   The estimated ejection fraction was in the range of 55% to 60%. - Aortic valve: Mechanical St Jude AVR with stable gradients since   2017 and no significant peri valvular regurgitation. - Mitral valve: Normal appearing mechanical St Jude MVR. - Left atrium: The atrium was severely dilated. - Atrial septum: No defect or patent foramen ovale was identified. - Tricuspid valve: There was moderate regurgitation. - Pulmonary arteries: PA peak pressure: 64 mm  Hg (S).    ASSESSMENT:    1. Chronic diastolic congestive heart failure ([place])   2. S/P AVR   3. H/O mitral valve replacement with mechanical valve   4. Coronary artery disease involving coronary bypass graft of native heart without angina pectoris   5. Chronic atrial fibrillation ([facility])   6. H/O: CVA (cerebrovascular accident)   7. OSA on CPAP      PLAN:  In order of problems listed above:  1. Chronic diastolic heart failure: He has lost close to 20 pounds in 3 days with metolazone, will switch metolazone to once weekly dosing.  He has been instructed to take extra 20 mEq of potassium on the day he take metolazone.  He likely will continue to have at least 1-2+ pitting edema chronically, however he is approaching euvolemic level.  2. Mechanical aortic valve: On Coumadin, Coumadin is monitored and managed by primary care provider  3. CAD s/p CABG: Denies any chest discomfort  4. Chronic atrial fibrillation: On Coumadin for mechanical aortic valve.  5. S/p AVR: Although he does have murmur on physical exam, however recent echocardiogram shows stable anatomy.  Medication Adjustments/Labs and Tests Ordered: Current medicines are reviewed at length with the patient today.  Concerns regarding medicines are outlined above.  Medication changes, Labs and Tests ordered today are listed in the Patient Instructions below. Patient Instructions  Medication Instructions:  TAKE Metolazone once a week EITHER Sunday's or Monday's  Labwork: Your physician recommends that you return for lab work in: TODAY-BMET  Testing/Procedures: NONE   Follow-Up: Your physician recommends that you schedule a follow-up appointment in: [place], PA-C Your physician recommends that you schedule a follow-up appointment in: [place]  Any Other Special Instructions Will Be Listed Below (If Applicable). If you need a refill on your cardiac medications before  your next appointment, please call your pharmacy.      Hilbert Corrigan, [place]  03/13/2018 11:38 PM    [facility] [place], [place], [place]  [address] Phone: [phone]; Fax: [phone]

## 2018-03-11 NOTE — Patient Instructions (Addendum)
Medication Instructions:  TAKE Metolazone once a week EITHER Sunday's or Monday's  Labwork: Your physician recommends that you return for lab work in: TODAY-BMET  Testing/Procedures: NONE   Follow-Up: Your physician recommends that you schedule a follow-up appointment in: [place], PA-C Your physician recommends that you schedule a follow-up appointment in: [place]  Any Other Special Instructions Will Be Listed Below (If Applicable). If you need a refill on your cardiac medications before your next appointment, please call your pharmacy.

## 2018-03-12 LAB — BASIC METABOLIC PANEL
BUN / CREAT RATIO: 22 — AB (ref 9–20)
BUN: 19 mg/dL (ref 6–24)
CHLORIDE: 87 mmol/L — AB (ref 96–106)
CO2: 34 mmol/L — ABNORMAL HIGH (ref 20–29)
Calcium: 8.3 mg/dL — ABNORMAL LOW (ref 8.7–10.2)
Creatinine, Ser: 0.87 mg/dL (ref 0.76–1.27)
GFR calc Af Amer: 112 mL/min/{1.73_m2} (ref >59)
GFR calc non Af Amer: 96 mL/min/{1.73_m2} (ref >59)
GLUCOSE: 102 mg/dL — AB (ref 65–99)
POTASSIUM: 3.3 mmol/L — AB (ref 3.5–5.2)
SODIUM: 137 mmol/L (ref 134–144)

## 2018-03-13 ENCOUNTER — Encounter: Payer: Self-pay | Admitting: Physician Assistant

## 2018-03-15 ENCOUNTER — Other Ambulatory Visit: Payer: Self-pay

## 2018-03-15 DIAGNOSIS — Z79899 Other long term (current) drug therapy: Secondary | ICD-10-CM

## 2018-03-22 ENCOUNTER — Telehealth: Payer: Self-pay | Admitting: Physician Assistant

## 2018-03-22 NOTE — Telephone Encounter (Signed)
Spoke with patient and about a week ago his weight was 201. He does weigh himself multiple times during the day. His first thing morning weights have been fluctuating up as high as 208. This morning his weight was 205 and couple hours later down to 203. Explained first morning weights after urinates were most important to follow daily weights, which he stated he was getting. Will forward to [employer] PA for review

## 2018-03-22 NOTE — Telephone Encounter (Signed)
New Message    Patient says that with his last appointment he was advised to keep track of his weight. So for the past two weeks he has been tracking and he has gain 7lbs. He says its been up and now. Today he is 205 when he started he was 204. The highest has been 218 in the middle of the afternoon.  He said slight swelling in leg but only when he has been on them all day. Please call.

## 2018-03-22 NOTE — Telephone Encounter (Signed)
Left message to call back  

## 2018-03-24 ENCOUNTER — Ambulatory Visit (INDEPENDENT_AMBULATORY_CARE_PROVIDER_SITE_OTHER): Payer: Medicare HMO | Admitting: *Deleted

## 2018-03-24 DIAGNOSIS — I482 Chronic atrial fibrillation, unspecified: Secondary | ICD-10-CM

## 2018-03-24 DIAGNOSIS — Z5181 Encounter for therapeutic drug level monitoring: Secondary | ICD-10-CM | POA: Diagnosis not present

## 2018-03-24 LAB — POCT INR: INR: 3.3 — AB (ref 2.0–3.0)

## 2018-03-24 NOTE — Patient Instructions (Signed)
Description   Continue taking 1 tablet daily except 1.5 tablets on Sundays, Tuesdays and Thursdays.  Recheck INR 6 weeks. Call us with any medication changes or procedure to coumadin Clinic @ [phone]

## 2018-03-25 NOTE — Telephone Encounter (Signed)
Left message to call back  

## 2018-03-25 NOTE — Telephone Encounter (Signed)
Weight is stable, he was suppose to have a 1 week followup BMET done earlier this week to check potassium level. I have not seen it. Will aim for a dry weight of between 201-203 lbs.

## 2018-03-29 ENCOUNTER — Ambulatory Visit: Payer: Medicare HMO | Admitting: Internal Medicine

## 2018-03-29 ENCOUNTER — Encounter: Payer: Self-pay | Admitting: Internal Medicine

## 2018-03-29 ENCOUNTER — Telehealth: Payer: Self-pay | Admitting: *Deleted

## 2018-03-29 VITALS — BP 112/64 | HR 72 | Ht 59.5 in | Wt 206.0 lb

## 2018-03-29 DIAGNOSIS — Z86010 Personal history of colonic polyps: Secondary | ICD-10-CM

## 2018-03-29 DIAGNOSIS — Z7901 Long term (current) use of anticoagulants: Secondary | ICD-10-CM

## 2018-03-29 NOTE — Telephone Encounter (Signed)
Pt will require bridging with Lovenox in order to hold his warfarin for 5 days periprocedurally due to hx of mechanical  MVR and AVR plus prior CVA. Pt has used Lovenox multiple times in the past. Will coordinate bridge with pt in Coumadin clinic.

## 2018-03-29 NOTE — Progress Notes (Signed)
Subjective:    Patient ID: Lance Cook, male    DOB: 03-19-1961, 57 y.o.   MRN: [id number]  HPI Lance Cook is a 57 year old male with a history of multiple high risk adenomatous colon polyps, history of C. difficile colitis, valvular heart disease with history of AVR status post Saint Jude mechanical valve in 2016, CAD, chronic atrial fibrillation, chronic diastolic CHF, prior CVA, chronic venous insufficiency, sleep apnea on chronic anticoagulation therapy with warfarin who is seen to discuss polyp surveillance.  He is here with his wife.  From a GI perspective he reports he is doing well.  He denies abdominal pain.  Has not had recent issues with his hemorrhoids.  He also had a previous history of diarrhea/loose stools and this has been improved.  He is using Metamucil about once per week.  He denies seeing blood in his stool or melena.  Denies upper GI and hepatobiliary complaint today.  Does continue to have issues with lower extremity edema and continues on diuretic therapy.  He also has mobility issues limited by his edema.  He has exertional dyspnea.  He has been dealing with muscle cramping which seems to improve with muscle relaxers.  No chest pain today.  No new palpitations.  In 2017 he had 6 polyps removed which ranged in size from 6 mm to 40 mm.  These polyps were tubular adenoma and tubulovillous adenoma.  The largest had focal high-grade dysplasia.  Negative for invasive carcinoma.  Follow-up colonoscopy is recommended at 3 to 6 months but was unable to be performed because the patient could not afford Lovenox bridge.  Review of Systems As per HPI, otherwise negative  Current Medications, Allergies, Past Medical History, Past Surgical History, Family History and Social History were reviewed in [employer] record.     Objective:   Physical Exam BP 112/64 (BP Location: Left Arm, Patient Position: Sitting, Cuff Size: Normal)   Pulse 72   Ht 4' 11.5" (1.511  m) Comment: height measured without shoes  Wt 206 lb (93.4 kg)   BMI 40.91 kg/m  Constitutional: Well-developed and well-nourished. No distress. HEENT: Normocephalic and atraumatic. Conjunctivae are normal.  No scleral icterus. Neck: Neck supple. Trachea midline. Cardiovascular: Mechanical S1/S2, 3/6 SEM Pulmonary/chest: Effort normal and breath sounds normal. No wheezing, rales or rhonchi. Abdominal: Soft, nontender, nondistended. Bowel sounds active throughout.  Extremities: 2+ edema with changes of venous stasis Neurological: Alert and oriented to person place and time. Skin: Skin is warm and dry. Psychiatric: Normal mood and affect. Behavior is normal.  CBC    Component Value Date/Time   WBC 14.5 (H) 01/14/2018 1943   RBC 5.23 01/14/2018 1943   HGB 11.3 (L) 01/14/2018 1943   HGB 15.1 03/02/2013 1132   HGB 13.2 02/03/2012 1144   HCT 38.9 (L) 01/14/2018 1943   HCT 44.5 03/02/2013 1132   HCT 41.5 02/03/2012 1144   PLT 241 01/14/2018 1943   PLT 126 (L) 03/02/2013 1132   PLT 122 (L) 02/03/2012 1144   MCV 74.4 (L) 01/14/2018 1943   MCV 87 03/02/2013 1132   MCV 79.7 02/03/2012 1144   MCH 21.6 (L) 01/14/2018 1943   MCHC 29.0 (L) 01/14/2018 1943   RDW 19.9 (H) 01/14/2018 1943   RDW 17.0 (H) 03/02/2013 1132   RDW 23.4 (H) 02/03/2012 1144   LYMPHSABS 0.4 (L) 01/14/2018 1943   LYMPHSABS 1.7 09/22/2012 0413   LYMPHSABS 1.0 02/03/2012 1144   MONOABS 0.6 01/14/2018 1943   MONOABS 0.9  09/22/2012 0413   MONOABS 0.5 02/03/2012 1144   EOSABS 0.2 01/14/2018 1943   EOSABS 0.2 09/22/2012 0413   BASOSABS 0.1 01/14/2018 1943   BASOSABS 0.1 09/22/2012 0413   BASOSABS 0.1 02/03/2012 1144   CMP     Component Value Date/Time   NA 137 03/11/2018 1451   NA 138 12/09/2013 1606   K 3.3 (L) 03/11/2018 1451   K 4.3 12/09/2013 1606   CL 87 (L) 03/11/2018 1451   CL 104 12/09/2013 1606   CO2 34 (H) 03/11/2018 1451   CO2 30 12/09/2013 1606   GLUCOSE 102 (H) 03/11/2018 1451   GLUCOSE 91  01/14/2018 1943   GLUCOSE 85 12/09/2013 1606   BUN 19 03/11/2018 1451   BUN 22 (H) 12/09/2013 1606   CREATININE 0.87 03/11/2018 1451   CREATININE 0.94 01/16/2017 0903   CALCIUM 8.3 (L) 03/11/2018 1451   CALCIUM 9.2 12/09/2013 1606   PROT 5.0 (L) 01/14/2018 1943   PROT 7.1 12/09/2013 1606   ALBUMIN 2.0 (L) 01/14/2018 1943   ALBUMIN 3.7 12/09/2013 1606   AST 20 01/14/2018 1943   AST 29 12/09/2013 1606   ALT 16 (L) 01/14/2018 1943   ALT 26 12/09/2013 1606   ALKPHOS 60 01/14/2018 1943   ALKPHOS 56 12/09/2013 1606   BILITOT 0.3 01/14/2018 1943   BILITOT 0.9 12/09/2013 1606   GFRNONAA 96 03/11/2018 1451   GFRNONAA >60 12/09/2013 1606   GFRAA 112 03/11/2018 1451   GFRAA >60 12/09/2013 1606       Assessment & Plan:  57 year old male with a history of multiple high risk adenomatous colon polyps, history of C. difficile colitis, valvular heart disease with history of AVR status post Saint Jude mechanical valve in 2016, CAD, chronic atrial fibrillation, chronic diastolic CHF, prior CVA, chronic venous insufficiency, sleep apnea on chronic anticoagulation therapy with warfarin who is seen to discuss polyp surveillance  1.  History of tubulovillous adenoma/tubular adenoma with high-grade dysplasia --unfortunately polyp surveillance is overdue.  I recommended we proceed with repeat colonoscopy as soon as possible.  This will need to be done in the outpatient hospital setting and off of warfarin therapy.  We will discuss this with Dr. [name] and I presume he will need a Lovenox bridge.  We discussed these procedures including the risk, benefits and alternatives and he is agreeable and wishes to proceed.  Will hold warfarin 5 days prior to endoscopic procedures - will instruct when and how to resume after procedure. Benefits and risks of procedure explained including risks of bleeding, perforation, infection, missed lesions, reactions to medications and possible need for hospitalization and surgery  for complications. Additional rare but real risk of stroke or other vascular clotting events off warfarin also explained and need to seek urgent help if any signs of these problems occur. Will communicate by phone or EMR with patient's  prescribing provider to confirm that holding warfarin is reasonable in this case.   HIGHER THAN BASELINE RISK.The nature of the procedure, as well as the risks, benefits, and alternatives were carefully and thoroughly reviewed with the patient. Ample time for discussion and questions allowed. The patient understood, was satisfied, and agreed to proceed.   25 minutes spent with the patient today. Greater than 50% was spent in counseling and coordination of care with the patient

## 2018-03-29 NOTE — Patient Instructions (Signed)
You have been scheduled for a colonoscopy. Please follow written instructions given to you at your visit today.  Please pick up your prep supplies at the pharmacy within the next 1-3 days. If you use inhalers (even only as needed), please bring them with you on the day of your procedure. Your physician has requested that you go to www.startemmi.com and enter the access code given to you at your visit today. This web site gives a general overview about your procedure. However, you should still follow specific instructions given to you by our office regarding your preparation for the procedure.  You will be contaced by our office prior to your procedure for directions on holding your Coumadin/Warfarin.  If you do not hear from our office 1 week prior to your scheduled procedure, please call [phone] to discuss.  If you are age 57 or older, your body mass index should be between 23-30. Your Body mass index is 40.91 kg/m. If this is out of the aforementioned range listed, please consider follow up with your Primary Care Provider.  If you are age 4 or younger, your body mass index should be between 19-25. Your Body mass index is 40.91 kg/m. If this is out of the aformentioned range listed, please consider follow up with your Primary Care Provider.

## 2018-03-29 NOTE — Telephone Encounter (Signed)
See previous

## 2018-03-29 NOTE — Telephone Encounter (Signed)
Will send to Pharm, pt also has appt with Almyra Deforest, PA 04/02/18.

## 2018-03-29 NOTE — Telephone Encounter (Signed)
Request for surgical clearance:     Endoscopy Procedure  What type of surgery is being performed?     colonoscopy  When is this surgery scheduled?     06/01/18  What type of clearance is required ?   Pharmacy  Are there any medications that need to be held prior to surgery and how long? Coumadin, 5 days.... ?Lovenox [place]???  Practice name and name of physician performing surgery?      [facility]  What is your office phone and fax number?      Phone- [phone]  [phone]  Anesthesia type (None, local, MAC, general) ?       MAC

## 2018-04-01 MED FILL — DIGOXIN 250 MCG TABLET: 250 | 30 days supply | Qty: 30 | Fill #1

## 2018-04-02 ENCOUNTER — Ambulatory Visit: Payer: Medicare HMO | Admitting: Physician Assistant

## 2018-04-05 NOTE — Telephone Encounter (Signed)
Spoke with patient and he will get labs tomorrow. 

## 2018-04-06 ENCOUNTER — Other Ambulatory Visit: Payer: Medicare HMO

## 2018-04-06 ENCOUNTER — Other Ambulatory Visit: Payer: Self-pay | Admitting: Physician Assistant

## 2018-04-06 ENCOUNTER — Telehealth: Payer: Self-pay | Admitting: Internal Medicine

## 2018-04-06 NOTE — Telephone Encounter (Signed)
Noted  

## 2018-04-07 LAB — BASIC METABOLIC PANEL
BUN/Creatinine Ratio: 27 — ABNORMAL HIGH (ref 9–20)
BUN: 21 mg/dL (ref 6–24)
CO2: 30 mmol/L — AB (ref 20–29)
Calcium: 8.1 mg/dL — ABNORMAL LOW (ref 8.7–10.2)
Chloride: 92 mmol/L — ABNORMAL LOW (ref 96–106)
Creatinine, Ser: 0.79 mg/dL (ref 0.76–1.27)
GFR, EST AFRICAN AMERICAN: 116 mL/min/{1.73_m2} (ref >59)
GFR, EST NON AFRICAN AMERICAN: 100 mL/min/{1.73_m2} (ref >59)
Glucose: 134 mg/dL — ABNORMAL HIGH (ref 65–99)
POTASSIUM: 3.8 mmol/L (ref 3.5–5.2)
SODIUM: 139 mmol/L (ref 134–144)

## 2018-04-07 NOTE — Progress Notes (Signed)
Kidney function stable, potassium level improved.

## 2018-04-11 ENCOUNTER — Other Ambulatory Visit: Payer: Self-pay | Admitting: Cardiology

## 2018-04-13 ENCOUNTER — Encounter: Payer: Self-pay | Admitting: Nurse Practitioner

## 2018-04-13 ENCOUNTER — Other Ambulatory Visit: Payer: Self-pay

## 2018-04-13 ENCOUNTER — Ambulatory Visit: Payer: Medicare HMO | Attending: Nurse Practitioner | Admitting: Nurse Practitioner

## 2018-04-13 VITALS — BP 108/64 | HR 55 | Temp 98.3°F | Resp 16 | Ht 60.0 in | Wt 199.0 lb

## 2018-04-13 DIAGNOSIS — M47816 Spondylosis without myelopathy or radiculopathy, lumbar region: Secondary | ICD-10-CM | POA: Diagnosis not present

## 2018-04-13 DIAGNOSIS — M79605 Pain in left leg: Secondary | ICD-10-CM

## 2018-04-13 DIAGNOSIS — G894 Chronic pain syndrome: Secondary | ICD-10-CM

## 2018-04-13 DIAGNOSIS — M48061 Spinal stenosis, lumbar region without neurogenic claudication: Secondary | ICD-10-CM | POA: Diagnosis not present

## 2018-04-13 DIAGNOSIS — M7918 Myalgia, other site: Secondary | ICD-10-CM | POA: Diagnosis not present

## 2018-04-13 DIAGNOSIS — G8929 Other chronic pain: Secondary | ICD-10-CM

## 2018-04-13 MED ORDER — OXYCODONE HCL 5 MG PO TABS: 5.0000 mg | ORAL_TABLET | Freq: Three times a day (TID) | ORAL | 0 refills | Status: DC | PRN

## 2018-04-13 MED ORDER — METHOCARBAMOL 500 MG PO TABS: 500.0000 mg | ORAL_TABLET | Freq: Three times a day (TID) | ORAL | 0 refills | Status: DC

## 2018-04-13 MED ORDER — OXYCODONE HCL 5 MG PO TABS
5.0000 mg | ORAL_TABLET | Freq: Three times a day (TID) | ORAL | 0 refills | Status: DC | PRN
Start: 2018-04-30 — End: 2018-04-28

## 2018-04-13 NOTE — Patient Instructions (Addendum)
____________________________________________________________________________________________  Appointment Policy Summary  It is our goal and responsibility to provide the medical community with assistance in the evaluation and management of patients with chronic pain. Unfortunately our resources are limited. Because we do not have an unlimited amount of time, or available appointments, we are required to closely monitor and manage their use. The following rules exist to maximize their use:  Patient's responsibilities: 1. Punctuality:  At what time should I arrive? You should be physically present in our office 30 minutes before your scheduled appointment. Your scheduled appointment is with your assigned healthcare provider. However, it takes 5-10 minutes to be "checked-in", and another 15 minutes for the nurses to do the admission. If you arrive to our office at the time you were given for your appointment, you will end up being at least 20-25 minutes late to your appointment with the provider. 2. Tardiness:  What happens if I arrive only a few minutes after my scheduled appointment time? You will need to reschedule your appointment. The cutoff is your appointment time. This is why it is so important that you arrive at least 30 minutes before that appointment. If you have an appointment scheduled for 10:00 AM and you arrive at 10:01, you will be required to reschedule your appointment.  3. Plan ahead:  Always assume that you will encounter traffic on your way in. Plan for it. If you are dependent on a driver, make sure they understand these rules and the need to arrive early. 4. Other appointments and responsibilities:  Avoid scheduling any other appointments before or after your pain clinic appointments.  5. Be prepared:  Write down everything that you need to discuss with your healthcare provider and give this information to the admitting nurse. Write down the medications that you will need  refilled. Bring your pills and bottles (even the empty ones), to all of your appointments, except for those where a procedure is scheduled. 6. No children or pets:  Find someone to take care of them. It is not appropriate to bring them in. 7. Scheduling changes:  We request "advanced notification" of any changes or cancellations. 8. Advanced notification:  Defined as a time period of more than 24 hours prior to the originally scheduled appointment. This allows for the appointment to be offered to other patients. 9. Rescheduling:  When a visit is rescheduled, it will require the cancellation of the original appointment. For this reason they both fall within the category of "Cancellations".  10. Cancellations:  They require advanced notification. Any cancellation less than 24 hours before the  appointment will be recorded as a "No Show". 11. No Show:  Defined as an unkept appointment where the patient failed to notify or declare to the practice their intention or inability to keep the appointment.  Corrective process for repeat offenders:  1. Tardiness: Three (3) episodes of rescheduling due to late arrivals will be recorded as one (1) "No Show". 2. Cancellation or reschedule: Three (3) cancellations or rescheduling will be recorded as one (1) "No Show". 3. "No Shows": Three (3) "No Shows" within a 12 month period will result in discharge from the practice. ____________________________________________________________________________________________  ____________________________________________________________________________________________  Pain Scale  Introduction: The pain score used by this practice is the Verbal Numerical Rating Scale (VNRS-11). This is an 11-point scale. It is for adults and children 10 years or older. There are significant differences in how the pain score is reported, used, and applied. Forget everything you learned in the past and learn  this scoring system.  General  Information: The scale should reflect your current level of pain. Unless you are specifically asked for the level of your worst pain, or your average pain. If you are asked for one of these two, then it should be understood that it is over the past 24 hours.  Basic Activities of Daily Living (ADL): Personal hygiene, dressing, eating, transferring, and using restroom.  Instructions: Most patients tend to report their level of pain as a combination of two factors, their physical pain and their psychosocial pain. This last one is also known as "suffering" and it is reflection of how physical pain affects you socially and psychologically. From now on, report them separately. From this point on, when asked to report your pain level, report only your physical pain. Use the following table for reference.  Pain Clinic Pain Levels (0-5/10)  Pain Level Score  Description  No Pain 0   Mild pain 1 Nagging, annoying, but does not interfere with basic activities of daily living (ADL). Patients are able to eat, bathe, get dressed, toileting (being able to get on and off the toilet and perform personal hygiene functions), transfer (move in and out of bed or a chair without assistance), and maintain continence (able to control bladder and bowel functions). Blood pressure and heart rate are unaffected. A normal heart rate for a healthy adult ranges from 60 to 100 bpm (beats per minute).   Mild to moderate pain 2 Noticeable and distracting. Impossible to hide from other people. More frequent flare-ups. Still possible to adapt and function close to normal. It can be very annoying and may have occasional stronger flare-ups. With discipline, patients may get used to it and adapt.   Moderate pain 3 Interferes significantly with activities of daily living (ADL). It becomes difficult to feed, bathe, get dressed, get on and off the toilet or to perform personal hygiene functions. Difficult to get in and out of bed or a chair  without assistance. Very distracting. With effort, it can be ignored when deeply involved in activities.   Moderately severe pain 4 Impossible to ignore for more than a few minutes. With effort, patients may still be able to manage work or participate in some social activities. Very difficult to concentrate. Signs of autonomic nervous system discharge are evident: dilated pupils (mydriasis); mild sweating (diaphoresis); sleep interference. Heart rate becomes elevated (>115 bpm). Diastolic blood pressure (lower number) rises above 100 mmHg. Patients find relief in laying down and not moving.   Severe pain 5 Intense and extremely unpleasant. Associated with frowning face and frequent crying. Pain overwhelms the senses.  Ability to do any activity or maintain social relationships becomes significantly limited. Conversation becomes difficult. Pacing back and forth is common, as getting into a comfortable position is nearly impossible. Pain wakes you up from deep sleep. Physical signs will be obvious: pupillary dilation; increased sweating; goosebumps; brisk reflexes; cold, clammy hands and feet; nausea, vomiting or dry heaves; loss of appetite; significant sleep disturbance with inability to fall asleep or to remain asleep. When persistent, significant weight loss is observed due to the complete loss of appetite and sleep deprivation.  Blood pressure and heart rate becomes significantly elevated. Caution: If elevated blood pressure triggers a pounding headache associated with blurred vision, then the patient should immediately seek attention at an urgent or emergency care unit, as these may be signs of an impending stroke.    Emergency Department Pain Levels (6-10/10)  Emergency Room Pain 6 Severely   limiting. Requires emergency care and should not be seen or managed at an outpatient pain management facility. Communication becomes difficult and requires great effort. Assistance to reach the emergency department  may be required. Facial flushing and profuse sweating along with potentially dangerous increases in heart rate and blood pressure will be evident.   Distressing pain 7 Self-care is very difficult. Assistance is required to transport, or use restroom. Assistance to reach the emergency department will be required. Tasks requiring coordination, such as bathing and getting dressed become very difficult.   Disabling pain 8 Self-care is no longer possible. At this level, pain is disabling. The individual is unable to do even the most "basic" activities such as walking, eating, bathing, dressing, transferring to a bed, or toileting. Fine motor skills are lost. It is difficult to think clearly.   Incapacitating pain 9 Pain becomes incapacitating. Thought processing is no longer possible. Difficult to remember your own name. Control of movement and coordination are lost.   The worst pain imaginable 10 At this level, most patients pass out from pain. When this level is reached, collapse of the autonomic nervous system occurs, leading to a sudden drop in blood pressure and heart rate. This in turn results in a temporary and dramatic drop in blood flow to the brain, leading to a loss of consciousness. Fainting is one of the body's self defense mechanisms. Passing out puts the brain in a calmed state and causes it to shut down for a while, in order to begin the healing process.    Summary: 1. Refer to this scale when providing [place] with your pain level. 2. Be accurate and careful when reporting your pain level. This will help with your care. 3. Over-reporting your pain level will lead to loss of credibility. 4. Even a level of 1/10 means that there is pain and will be treated at our facility. 5. High, inaccurate reporting will be documented as "Symptom Exaggeration", leading to loss of credibility and suspicions of possible secondary gains such as obtaining more narcotics, or wanting to appear disabled, for  fraudulent reasons. 6. Only pain levels of 5 or below will be seen at our facility. 7. Pain levels of 6 and above will be sent to the Emergency Department and the appointment cancelled. ____________________________________________________________________________________________    BMI Assessment: Estimated body mass index is 38.86 kg/m as calculated from the following:   Height as of this encounter: 5' (1.524 m).   Weight as of this encounter: 199 lb (90.3 kg).  BMI interpretation table: BMI level Category Range association with higher incidence of chronic pain  <18 kg/m2 Underweight   18.5-24.9 kg/m2 Ideal body weight   25-29.9 kg/m2 Overweight Increased incidence by 20%  30-34.9 kg/m2 Obese (Class I) Increased incidence by 68%  35-39.9 kg/m2 Severe obesity (Class II) Increased incidence by 136%  >40 kg/m2 Extreme obesity (Class III) Increased incidence by 254%   Patient's current BMI Ideal Body weight  Body mass index is 38.86 kg/m. Ideal body weight: 50 kg (110 lb 3.7 oz) Adjusted ideal body weight: 66.1 kg (145 lb 11.8 oz)   BMI Readings from Last 4 Encounters:  04/13/18 38.86 kg/m  03/29/18 40.91 kg/m  03/11/18 38.81 kg/m  03/08/18 42.48 kg/m   Wt Readings from Last 4 Encounters:  04/13/18 199 lb (90.3 kg)  03/29/18 206 lb (93.4 kg)  03/11/18 205 lb 6.4 oz (93.2 kg)  03/08/18 224 lb 12.8 oz (102 kg)

## 2018-04-13 NOTE — Progress Notes (Signed)
Patient's Name: Lance Cook  MRN: [id number]  Referring Provider: [name], PA-C  DOB: 02-Dec-1960  PCP: Selinda Orion  DOS: 04/13/2018  Note by: Vevelyn Francois NP  Service setting: Ambulatory outpatient  Specialty: Interventional Pain Management  Location: [facility] (AMB) Pain Management Facility    Patient type: Established    Primary Reason(s) for Visit: Encounter for prescription drug management. (Level of risk: moderate)  CC: Back Pain (lower)  HPI  Lance Cook is a 57 y.o. year old, male patient, who comes today for a medication management evaluation. 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; Ascending aortic dissection ([place]); Chronic diastolic congestive heart failure (HCC); 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; Enteritis due to Clostridium difficile; Hypomagnesemia; Physical deconditioning; Long term current use of opiate analgesic; Long term prescription opiate use; Opiate use (22.5 MME/day); Opiate dependence ([place]); Encounter for therapeutic drug level monitoring; Chronic low back pain (midline); Lumbar facet syndrome; Personal history of transient ischemic attack (TIA), and cerebral infarction without residual deficits; ED (erectile dysfunction) of organic origin; Eunuchoidism; Adult BMI 30+; Obstructive apnea; Testicular hypofunction; Lumbar canal stenosis; Malignant neoplasm of testis ([place]);  Chronic lower extremity pain (Left); Personal history of malignant neoplasm of testis; Musculoskeletal pain; History of colonic polyps; Chronic diarrhea; Benign neoplasm of transverse colon; Scrotal swelling; Chronic pain syndrome; Acute on chronic diastolic CHF (congestive heart failure) (HCC); S/P MVR (mitral valve replacement); H/O TIA (transient ischemic attack) and stroke; Heart valve replaced by other means; HTN (hypertension); Low back pain; Lymphadenopathy; Male hypogonadism; Obesity (BMI 30-39.9); Obstructive sleep apnea; Other testicular hypofunction; Presence of other heart-valve replacement; S/P AVR (aortic valve replacement); Spinal stenosis of lumbar region without neurogenic claudication; Spondylosis; Type 2 diabetes mellitus with other specified complication ([place]); Type II or unspecified type diabetes mellitus with other specified manifestations, not stated as uncontrolled; Leg swelling; Unilateral vocal cord paralysis; Insomnia; 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 His primarily concern today is the Back Pain (lower)  Pain Assessment: Location: Lower Back Radiating: denies Onset: More than a month ago Duration: Chronic pain Quality: Aching, Constant, Stabbing, Burning Severity: 7 /10 (subjective, self-reported pain score)  Note: Reported level is compatible with observation. Clinically the patient looks like a 2/10 A 2/10 is viewed as "Mild to Moderate" and described as noticeable and distracting. Impossible to hide from other people. More frequent flare-ups. Still possible to adapt and function close to normal. It can be very annoying and may have occasional stronger flare-ups. With discipline, patients may get used to it and adapt.       When using our objective Pain Scale, levels between 6 and 10/10 are said to belong in an emergency room, as it  progressively worsens from a 6/10, described as severely limiting, requiring emergency care not usually available at an outpatient pain management facility. At a 6/10 level, communication becomes difficult and requires great effort. Assistance to reach the emergency department may be required. Facial flushing and profuse sweating along  with potentially dangerous increases in heart rate and blood pressure will be evident. Effect on ADL: walking, sitting Timing: Constant Modifying factors: rest, medications BP: 108/64  HR: (!) 55  Lance Cook was last scheduled for an appointment on 01/12/2018 for medication management. During today's appointment we reviewed Lance Cook's chronic pain status, as well as his outpatient medication regimen.  He has recurrent cellulitis which she states is because he has increase itching in both legs. He feels like this is related to his poor circulation. He admits that he is spoke with the cardiologist however was advised to reach out to pain management. He admits that his legs do swell and weep on occasions that he knows how to treat this. He admits that leg pain and itching is worse in his lower back pain.  The patient  reports that he does not use drugs. His body mass index is 38.86 kg/m.  Further details on both, my assessment(s), as well as the proposed treatment plan, please see below.  Controlled Substance Pharmacotherapy Assessment REMS (Risk Evaluation and Mitigation Strategy)  Analgesic:Oxycodone IR 66m q 8hrs (15 mg/day of oxycodone) MME/day:22.5 mg/day   GIgnatius Specking RN  04/13/2018 11:16 AM  Sign at close encounter Nursing Pain Medication Assessment:  Safety precautions to be maintained throughout the outpatient stay will include: orient to surroundings, keep bed in low position, maintain call bell within reach at all times, provide assistance with transfer out of bed and ambulation.  Medication Inspection Compliance: Pill count conducted under  aseptic conditions, in front of the patient. Neither the pills nor the bottle was removed from the patient's sight at any time. Once count was completed pills were immediately returned to the patient in their original bottle.  Medication: Oxycodone IR Pill/Patch Count: 51 of 90 pills remain Pill/Patch Appearance: Markings consistent with prescribed medication Bottle Appearance: Standard pharmacy container. Clearly labeled. Filled Date: 8 / 1 / 2019 Last Medication intake:  Today   Pharmacokinetics: Liberation and absorption (onset of action): WNL Distribution (time to peak effect): WNL Metabolism and excretion (duration of action): WNL         Pharmacodynamics: Desired effects: Analgesia: Mr. CRasereports >50% benefit. Functional ability: Patient reports that medication allows him to accomplish basic ADLs Clinically meaningful improvement in function (CMIF): Sustained CMIF goals met Perceived effectiveness: Described as relatively effective, allowing for increase in activities of daily living (ADL) Undesirable effects: Side-effects or Adverse reactions: None reported Monitoring: [place] PMP: Online review of the past 138-montheriod conducted. Compliant with practice rules and regulations Last UDS on record: Summary  Date Value Ref Range Status  01/12/2018 FINAL  Final    Comment:    ==================================================================== TOXASSURE SELECT 13 (MW) ==================================================================== Test                             Result       Flag       Units Drug Present and Declared for Prescription Verification   Oxycodone                      183          EXPECTED   ng/mg creat   Oxymorphone                    67           EXPECTED   ng/mg creat   Noroxycodone  744          EXPECTED   ng/mg creat    Sources of oxycodone include scheduled prescription medications.    Oxymorphone and noroxycodone are expected  metabolites of    oxycodone. Oxymorphone is also available as a scheduled    prescription medication. ==================================================================== Test                      Result    Flag   Units      Ref Range   Creatinine              101              mg/dL      >=20 ==================================================================== Declared Medications:  The flagging and interpretation on this report are based on the  following declared medications.  Unexpected results may arise from  inaccuracies in the declared medications.  **Note: The testing scope of this panel includes these medications:  Oxycodone  **Note: The testing scope of this panel does not include following  reported medications:  Acetaminophen (Tylenol)  Allopurinol (Zyloprim)  Atenolol (Tenormin)  Digoxin  Furosemide  Magnesium Oxide  Methocarbamol  Polymyxin (PolyTrim)  Potassium  Topical  Trazodone (Desyrel)  Trimethoprim (PolyTrim)  Warfarin (Coumadin) ==================================================================== For clinical consultation, please call [phone]. ====================================================================    UDS interpretation: Compliant          Medication Assessment Form: Reviewed. Patient indicates being compliant with therapy Treatment compliance: Compliant Risk Assessment Profile: Aberrant behavior: See prior evaluations. None observed or detected today Comorbid factors increasing risk of overdose: See prior notes. No additional risks detected today Risk of substance use disorder (SUD): Low Opioid Risk Tool - 04/13/18 1128      Personal History of Substance Abuse   Alcohol  Negative    Illegal Drugs  Negative    Rx Drugs  Negative      Psychological Disease   Psychological Disease  Negative    Depression  Negative      Total Score   Opioid Risk Tool Scoring  0    Opioid Risk Interpretation  Low Risk      ORT Scoring  interpretation table:  Score <3 = Low Risk for SUD  Score between 4-7 = Moderate Risk for SUD  Score >8 = High Risk for Opioid Abuse   Risk Mitigation Strategies:  Patient Counseling: Covered Patient-Prescriber Agreement (PPA): Present and active  Notification to other healthcare providers: Done  Pharmacologic Plan: No change in therapy, at this time.             Laboratory Chemistry  Inflammation Markers (CRP: Acute Phase) (ESR: Chronic Phase) Lab Results  Component Value Date   CRP 0.9 10/09/2015   ESRSEDRATE 9 01/14/2016   LATICACIDVEN 1.59 02/05/2016                         Rheumatology Markers Lab Results  Component Value Date   LABURIC 12.1 (H) 11/01/2009                        Renal Function Markers Lab Results  Component Value Date   BUN 21 04/06/2018   CREATININE 0.79 04/06/2018   BCR 27 (H) 04/06/2018   GFRAA 116 04/06/2018   GFRNONAA 100 04/06/2018  Hepatic Function Markers Lab Results  Component Value Date   AST 20 01/14/2018   ALT 16 (L) 01/14/2018   ALBUMIN 2.0 (L) 01/14/2018   ALKPHOS 60 01/14/2018                        Electrolytes Lab Results  Component Value Date   NA 139 04/06/2018   K 3.8 04/06/2018   CL 92 (L) 04/06/2018   CALCIUM 8.1 (L) 04/06/2018   MG 2.0 01/28/2018   PHOS 3.5 02/09/2015                        Neuropathy Markers Lab Results  Component Value Date   VITAMINB12 371 01/07/2012   FOLATE 15.1 01/07/2012   HGBA1C 5.9 (H) 01/22/2015                        Bone Pathology Markers Lab Results  Component Value Date   TESTOFREE 49.5 03/22/2012   TESTOSTERONE 206.14 (L) 08/18/2012                         Coagulation Parameters Lab Results  Component Value Date   INR 3.3 (A) 03/24/2018   LABPROT 60.8 (H) 11/09/2017   APTT 41 (H) 01/24/2015   PLT 241 01/14/2018                        Cardiovascular Markers Lab Results  Component Value Date   BNP 142.5 (H) 01/14/2018   CKTOTAL 51  01/28/2018   CKMB 3.1 10/28/2009   TROPONINI <0.03 01/15/2018   HGB 11.3 (L) 01/14/2018   HCT 38.9 (L) 01/14/2018                         CA Markers No results found for: CEA, CA125, LABCA2                      Note: Lab results reviewed.  Recent Diagnostic Imaging Results   Complexity Note: Imaging results reviewed. Results shared with Lance Cook, using Layman's terms.                         Meds   Current Outpatient Medications:  .  acetaminophen (TYLENOL) 500 MG tablet, Take 500 mg by mouth every 8 (eight) hours as needed for moderate pain or headache. , Disp: , Rfl:  .  allopurinol (ZYLOPRIM) 300 MG tablet, Take 300 mg by mouth daily., Disp: , Rfl:  .  atenolol (TENORMIN) 25 MG tablet, Take 1 tablet (25 mg total) by mouth 2 (two) times daily., Disp: , Rfl:  .  digoxin (LANOXIN) 0.25 MG tablet, Take 1 tablet (250 mcg total) by mouth daily., Disp: 90 tablet, Rfl: 2 .  furosemide (LASIX) 80 MG tablet, Take 1 tablet (80 mg total) by mouth 2 (two) times daily., Disp: 60 tablet, Rfl: 1 .  magnesium oxide (MAG-OX) 400 (241.3 MG) MG tablet, Take 1 tablet (400 mg total) by mouth 2 (two) times daily., Disp: 180 tablet, Rfl: 3 .  metolazone (ZAROXOLYN) 5 MG tablet, Take 1 tablet (5 mg total) by mouth once a week., Disp: 14 tablet, Rfl: 0 .  potassium chloride (K-DUR) 10 MEQ tablet, TAKE 2 TABLETS BY MOUTH TWICE DAILY...(DUE FOR FOLLOW UP APPT), Disp: 60 tablet, Rfl: 6 .  traZODone (DESYREL) 50 MG tablet, Take 2 tablets by mouth at bedtime. , Disp: , Rfl:  .  warfarin (COUMADIN) 5 MG tablet, Take 5-7.5 mg by mouth See admin instructions. Take 1 and 1/2 tablets on Sunday, Tuesday and Thursday then take 1 tablet all the other days, Disp: , Rfl:  .  [START ON 04/30/2018] methocarbamol (ROBAXIN) 500 MG tablet, Take 1 tablet (500 mg total) by mouth 3 (three) times daily., Disp: 270 tablet, Rfl: 0 .  [START ON 06/29/2018] oxyCODONE (OXY IR/ROXICODONE) 5 MG immediate release tablet, Take 1 tablet (5  mg total) by mouth every 8 (eight) hours as needed for severe pain., Disp: 90 tablet, Rfl: 0 .  [START ON 05/30/2018] oxyCODONE (OXY IR/ROXICODONE) 5 MG immediate release tablet, Take 1 tablet (5 mg total) by mouth every 8 (eight) hours as needed for moderate pain or severe pain., Disp: 90 tablet, Rfl: 0 .  [START ON 04/30/2018] oxyCODONE (OXY IR/ROXICODONE) 5 MG immediate release tablet, Take 1 tablet (5 mg total) by mouth every 8 (eight) hours as needed for severe pain., Disp: 90 tablet, Rfl: 0  ROS  Constitutional: Denies any fever or chills Gastrointestinal: No reported hemesis, hematochezia, vomiting, or acute GI distress Musculoskeletal: Denies any acute onset joint swelling, redness, loss of ROM, or weakness Neurological: No reported episodes of acute onset apraxia, aphasia, dysarthria, agnosia, amnesia, paralysis, loss of coordination, or loss of consciousness  Allergies  Lance Cook is allergic to penicillins; adhesive [tape]; and bactrim [sulfamethoxazole-trimethoprim].  [place]  Drug: Lance Cook  reports that he does not use drugs. Alcohol:  reports that he drinks alcohol. Tobacco:  reports that he has been smoking cigars. He has never used smokeless tobacco. Medical:  has a past medical history of Acute on chronic diastolic CHF (congestive heart failure) ([place]) (08/27/2016), Anemia, Anxiety, ARDS (adult respiratory distress syndrome) ([place]) (01/27/2015), Arthritis, Ascending aortic dissection ([place]) (07/14/2008), Asymptomatic chronic venous hypertension (01/15/2010), Atrial fibrillation ([place]), Bell's palsy, C. difficile diarrhea, CAD (coronary artery disease), Cellulitis (04/02/2014), Cerebral artery occlusion with cerebral infarction ([place]) (10/08/2011), Chronic diastolic heart failure ([place]) (03/01/2015), Chronic LBP (10/08/2011), CVA (cerebral vascular accident) ([place]) (10/08/2011), DIABETES MELLITUS, TYPE II (11/01/2009), Diverticulosis, 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), Encephalopathy  acute (02/05/2015), Gastric polyps, GERD (gastroesophageal reflux disease), Gout, H/O mechanical aortic valve replacement (11/01/2009), Hiatal hernia, History of colon polyps (12/23/2011), HTN (hypertension) (02/23/2014), Hyperlipidemia, Hypertension, Hypogonadism male (04/08/2012), Impaired glucose tolerance (10/08/2011), Morbid obesity ([place]) (04/02/2014), Myocardial infarction [facility]) (age 10), OSA (obstructive sleep apnea), Peripheral vascular disease ([place]), 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 Surgical: Lance Cook  has a past surgical history that includes Laser ablation (Left, 03/06/2010); Cardiac valve replacement; Cholecystectomy (2011); Tonsillectomy (1967); Otoplasty; Orchiectomy (age 55); TEE without cardioversion (N/A, 10/11/2014); Bentall procedure (1988); Coronary artery bypass graft; aortic truck; TEE without cardioversion (N/A, 01/24/2015); Mitral valve replacement (Right, 01/24/2015); Tooth extraction (2016); and Colonoscopy with propofol (N/A, 06/17/2016). Family: family history includes Cancer in his maternal aunt and maternal uncle; Diabetes in his father; Heart disease in his mother; Hypertension in his other; Other in his mother; Stroke in his other; Throat cancer in his mother.  Constitutional Exam  General appearance: alert, cooperative and morbidly obese Vitals:   04/13/18 1114  BP:  108/64  Pulse: (!) 55  Resp: 16  Temp: 98.3 F (36.8 C)  SpO2: 95%  Weight: 199 lb (90.3 kg)  Height: 5' (1.524 m)  Psych/Mental status: Alert, oriented x 3 (person, place, & time)       Eyes: PERLA Respiratory: No evidence of acute  respiratory distress  Lumbar Spine Area Exam  Skin & Axial Inspection: No masses, redness, or swelling Alignment: Symmetrical Functional ROM: Unrestricted ROM       Stability: No instability detected Muscle Tone/Strength: Functionally intact. No obvious neuro-muscular anomalies detected. Sensory (Neurological): Unimpaired Palpation: No palpable anomalies       Provocative Tests: Hyperextension/rotation test: deferred today       Lumbar quadrant test (Kemp's test): deferred today       Lateral bending test: deferred today       Patrick's Maneuver: deferred today                   FABER test: deferred today                   S-I anterior distraction/compression test: deferred today         S-I lateral compression test: deferred today         S-I Thigh-thrust test: deferred today         S-I Gaenslen's test: deferred today          Gait & Posture Assessment  Ambulation: Unassisted Gait: Relatively normal for age and body habitus Posture: WNL   Lower Extremity Exam    Side: Right lower extremity  Side: Left lower extremity  Stability: No instability observed          Stability: No instability observed          Skin & Extremity Inspection: Venous stasis edema  Skin & Extremity Inspection: Venous stasis edema  Functional ROM: Adequate ROM                  Functional ROM: Adequate ROM                  Muscle Tone/Strength: Functionally intact. No obvious neuro-muscular anomalies detected.  Muscle Tone/Strength: Functionally intact. No obvious neuro-muscular anomalies detected.  Sensory (Neurological): Unimpaired  Sensory (Neurological): Unimpaired  Palpation: No palpable anomalies  Palpation: No palpable anomalies   Assessment  Primary Diagnosis & Pertinent Problem List: The primary encounter diagnosis was Spinal stenosis of lumbar region, unspecified whether neurogenic claudication present. Diagnoses of Lumbar facet syndrome, Chronic lower extremity pain (Left), Musculoskeletal pain,  and Chronic pain syndrome were also pertinent to this visit.  Status Diagnosis  Controlled Controlled Controlled 1. Spinal stenosis of lumbar region, unspecified whether neurogenic claudication present   2. Lumbar facet syndrome   3. Chronic lower extremity pain (Left)   4. Musculoskeletal pain   5. Chronic pain syndrome     Problems updated and reviewed during this visit: No problems updated. Plan of Care  Pharmacotherapy (Medications Ordered): Meds ordered this encounter  Medications  . methocarbamol (ROBAXIN) 500 MG tablet    Sig: Take 1 tablet (500 mg total) by mouth 3 (three) times daily.    Dispense:  270 tablet    Refill:  0    Do not place this medication, or any other prescription from our practice, on "Automatic Refill". Patient may have prescription filled one day early if pharmacy is closed on scheduled refill date.    Order Specific Question:   Supervising Provider  AnswerMilinda Pointer [[id number]]  . oxyCODONE (OXY IR/ROXICODONE) 5 MG immediate release tablet    Sig: Take 1 tablet (5 mg total) by mouth every 8 (eight) hours as needed for severe pain.    Dispense:  90 tablet    Refill:  0    Do not place this medication, or any other prescription from our practice, on "Automatic Refill". Patient may have prescription filled one day early if pharmacy is closed on schedule.DO NOT DELETE, even if Expired!!!  Do not fill until: 06/29/2018 To last until: 07/29/2018    Order Specific Question:   Supervising Provider    Answer:   Milinda Pointer [phone]  . oxyCODONE (OXY IR/ROXICODONE) 5 MG immediate release tablet    Sig: Take 1 tablet (5 mg total) by mouth every 8 (eight) hours as needed for moderate pain or severe pain.    Dispense:  90 tablet    Refill:  0    DO NOT DELETE, even if Expired!!! Do not place this medication, or any other prescription from our practice, on "Automatic Refill". Patient may have prescription filled one day early if pharmacy is closed  on scheduled refill date.Do not fill until: 05/30/2018 To last until:06/29/2018    Order Specific Question:   Supervising Provider    Answer:   Milinda Pointer [phone]  . oxyCODONE (OXY IR/ROXICODONE) 5 MG immediate release tablet    Sig: Take 1 tablet (5 mg total) by mouth every 8 (eight) hours as needed for severe pain.    Dispense:  90 tablet    Refill:  0    Do not place this medication, or any other prescription from our practice, on "Automatic Refill". Patient may have prescription filled one day early if pharmacy is closed on scheduled refill date. Do not fill until:04/30/2018 To last until:05/30/2018    Order Specific Question:   Supervising Provider    Answer:   Milinda Pointer [phone]   New Prescriptions   No medications on file   Medications administered today: Lance Cook had no medications administered during this visit. Lab-work, procedure(s), and/or referral(s): No orders of the defined types were placed in this encounter.  Imaging and/or referral(s): None  Interventional therapies: Planned, scheduled, and/or pending:  Not at this time.  Lidocaine compound cream sent to independent pharmacy   Considering:  Bilateral lumbar facet RFA.    Palliative PRN treatment(s):  Not at this time    Provider-requested follow-up: Return in about 3 months (around 07/14/2018) for MedMgmt with Me Donella Stade Edison Pace).  Future Appointments  Date Time Provider [place]  04/28/2018  9:00 AM Almyra Deforest, [place] [facility]  05/05/2018  2:00 PM [facility]  05/06/2018  3:00 PM Deneise Lever, [place] [facility] None  06/18/2018 10:20 AM [name], MD [facility]  07/14/2018 12:45 PM Vevelyn Francois, NP [facility] None   Primary Care Physician: Selinda Orion Location: [facility] Note by: Vevelyn Francois NP Date: 04/13/2018; Time: 1:07 PM  Pain Score Disclaimer: We use the  NRS-11 scale. This is a self-reported, subjective measurement of pain severity with only modest accuracy. It is used primarily to identify changes within a particular patient. It must be understood that outpatient pain scales are significantly less accurate that those used for research, where they can be applied under ideal controlled circumstances with minimal exposure to variables. In reality, the score is likely to be a combination of pain intensity and pain  affect, where pain affect describes the degree of emotional arousal or changes in action readiness caused by the sensory experience of pain. Factors such as social and work situation, setting, emotional state, anxiety levels, expectation, and prior pain experience may influence pain perception and show large inter-individual differences that may also be affected by time variables.  Patient instructions provided during this appointment: Patient Instructions   ____________________________________________________________________________________________  Appointment Policy Summary  It is our goal and responsibility to provide the medical community with assistance in the evaluation and management of patients with chronic pain. Unfortunately our resources are limited. Because we do not have an unlimited amount of time, or available appointments, we are required to closely monitor and manage their use. The following rules exist to maximize their use:  Patient's responsibilities: 1. Punctuality:  At what time should I arrive? You should be physically present in our office 30 minutes before your scheduled appointment. Your scheduled appointment is with your assigned healthcare provider. However, it takes 5-10 minutes to be "checked-in", and another 15 minutes for the nurses to do the admission. If you arrive to our office at the time you were given for your appointment, you will end up being at least 20-25 minutes late to your appointment with the  provider. 2. Tardiness:  What happens if I arrive only a few minutes after my scheduled appointment time? You will need to reschedule your appointment. The cutoff is your appointment time. This is why it is so important that you arrive at least 30 minutes before that appointment. If you have an appointment scheduled for 10:00 AM and you arrive at 10:01, you will be required to reschedule your appointment.  3. Plan ahead:  Always assume that you will encounter traffic on your way in. Plan for it. If you are dependent on a driver, make sure they understand these rules and the need to arrive early. 4. Other appointments and responsibilities:  Avoid scheduling any other appointments before or after your pain clinic appointments.  5. Be prepared:  Write down everything that you need to discuss with your healthcare provider and give this information to the admitting nurse. Write down the medications that you will need refilled. Bring your pills and bottles (even the empty ones), to all of your appointments, except for those where a procedure is scheduled. 6. No children or pets:  Find someone to take care of them. It is not appropriate to bring them in. 7. Scheduling changes:  We request "advanced notification" of any changes or cancellations. 8. Advanced notification:  Defined as a time period of more than 24 hours prior to the originally scheduled appointment. This allows for the appointment to be offered to other patients. 9. Rescheduling:  When a visit is rescheduled, it will require the cancellation of the original appointment. For this reason they both fall within the category of "Cancellations".  10. Cancellations:  They require advanced notification. Any cancellation less than 24 hours before the  appointment will be recorded as a "No Show". 11. No Show:  Defined as an unkept appointment where the patient failed to notify or declare to the practice their intention or inability to keep the  appointment.  Corrective process for repeat offenders:  1. Tardiness: Three (3) episodes of rescheduling due to late arrivals will be recorded as one (1) "No Show". 2. Cancellation or reschedule: Three (3) cancellations or rescheduling will be recorded as one (1) "No Show". 3. "No Shows": Three (3) "No Shows" within a 12 month  period will result in discharge from the practice. ____________________________________________________________________________________________  ____________________________________________________________________________________________  Pain Scale  Introduction: The pain score used by this practice is the Verbal Numerical Rating Scale (VNRS-11). This is an 11-point scale. It is for adults and children 10 years or older. There are significant differences in how the pain score is reported, used, and applied. Forget everything you learned in the past and learn this scoring system.  General Information: The scale should reflect your current level of pain. Unless you are specifically asked for the level of your worst pain, or your average pain. If you are asked for one of these two, then it should be understood that it is over the past 24 hours.  Basic Activities of Daily Living (ADL): Personal hygiene, dressing, eating, transferring, and using restroom.  Instructions: Most patients tend to report their level of pain as a combination of two factors, their physical pain and their psychosocial pain. This last one is also known as "suffering" and it is reflection of how physical pain affects you socially and psychologically. From now on, report them separately. From this point on, when asked to report your pain level, report only your physical pain. Use the following table for reference.  Pain Clinic Pain Levels (0-5/10)  Pain Level Score  Description  No Pain 0   Mild pain 1 Nagging, annoying, but does not interfere with basic activities of daily living (ADL). Patients are  able to eat, bathe, get dressed, toileting (being able to get on and off the toilet and perform personal hygiene functions), transfer (move in and out of bed or a chair without assistance), and maintain continence (able to control bladder and bowel functions). Blood pressure and heart rate are unaffected. A normal heart rate for a healthy adult ranges from 60 to 100 bpm (beats per minute).   Mild to moderate pain 2 Noticeable and distracting. Impossible to hide from other people. More frequent flare-ups. Still possible to adapt and function close to normal. It can be very annoying and may have occasional stronger flare-ups. With discipline, patients may get used to it and adapt.   Moderate pain 3 Interferes significantly with activities of daily living (ADL). It becomes difficult to feed, bathe, get dressed, get on and off the toilet or to perform personal hygiene functions. Difficult to get in and out of bed or a chair without assistance. Very distracting. With effort, it can be ignored when deeply involved in activities.   Moderately severe pain 4 Impossible to ignore for more than a few minutes. With effort, patients may still be able to manage work or participate in some social activities. Very difficult to concentrate. Signs of autonomic nervous system discharge are evident: dilated pupils (mydriasis); mild sweating (diaphoresis); sleep interference. Heart rate becomes elevated (>115 bpm). Diastolic blood pressure (lower number) rises above 100 mmHg. Patients find relief in laying down and not moving.   Severe pain 5 Intense and extremely unpleasant. Associated with frowning face and frequent crying. Pain overwhelms the senses.  Ability to do any activity or maintain social relationships becomes significantly limited. Conversation becomes difficult. Pacing back and forth is common, as getting into a comfortable position is nearly impossible. Pain wakes you up from deep sleep. Physical signs will be  obvious: pupillary dilation; increased sweating; goosebumps; brisk reflexes; cold, clammy hands and feet; nausea, vomiting or dry heaves; loss of appetite; significant sleep disturbance with inability to fall asleep or to remain asleep. When persistent, significant weight loss is observed due  to the complete loss of appetite and sleep deprivation.  Blood pressure and heart rate becomes significantly elevated. Caution: If elevated blood pressure triggers a pounding headache associated with blurred vision, then the patient should immediately seek attention at an urgent or emergency care unit, as these may be signs of an impending stroke.    Emergency Department Pain Levels (6-10/10)  Emergency Room Pain 6 Severely limiting. Requires emergency care and should not be seen or managed at an outpatient pain management facility. Communication becomes difficult and requires great effort. Assistance to reach the emergency department may be required. Facial flushing and profuse sweating along with potentially dangerous increases in heart rate and blood pressure will be evident.   Distressing pain 7 Self-care is very difficult. Assistance is required to transport, or use restroom. Assistance to reach the emergency department will be required. Tasks requiring coordination, such as bathing and getting dressed become very difficult.   Disabling pain 8 Self-care is no longer possible. At this level, pain is disabling. The individual is unable to do even the most "basic" activities such as walking, eating, bathing, dressing, transferring to a bed, or toileting. Fine motor skills are lost. It is difficult to think clearly.   Incapacitating pain 9 Pain becomes incapacitating. Thought processing is no longer possible. Difficult to remember your own name. Control of movement and coordination are lost.   The worst pain imaginable 10 At this level, most patients pass out from pain. When this level is reached, collapse of the  autonomic nervous system occurs, leading to a sudden drop in blood pressure and heart rate. This in turn results in a temporary and dramatic drop in blood flow to the brain, leading to a loss of consciousness. Fainting is one of the body's self defense mechanisms. Passing out puts the brain in a calmed state and causes it to shut down for a while, in order to begin the healing process.    Summary: 1. Refer to this scale when providing [place] with your pain level. 2. Be accurate and careful when reporting your pain level. This will help with your care. 3. Over-reporting your pain level will lead to loss of credibility. 4. Even a level of 1/10 means that there is pain and will be treated at our facility. 5. High, inaccurate reporting will be documented as "Symptom Exaggeration", leading to loss of credibility and suspicions of possible secondary gains such as obtaining more narcotics, or wanting to appear disabled, for fraudulent reasons. 6. Only pain levels of 5 or below will be seen at our facility. 7. Pain levels of 6 and above will be sent to the Emergency Department and the appointment cancelled. ____________________________________________________________________________________________    BMI Assessment: Estimated body mass index is 38.86 kg/m as calculated from the following:   Height as of this encounter: 5' (1.524 m).   Weight as of this encounter: 199 lb (90.3 kg).  BMI interpretation table: BMI level Category Range association with higher incidence of chronic pain  <18 kg/m2 Underweight   18.5-24.9 kg/m2 Ideal body weight   25-29.9 kg/m2 Overweight Increased incidence by 20%  30-34.9 kg/m2 Obese (Class I) Increased incidence by 68%  35-39.9 kg/m2 Severe obesity (Class II) Increased incidence by 136%  >40 kg/m2 Extreme obesity (Class III) Increased incidence by 254%   Patient's current BMI Ideal Body weight  Body mass index is 38.86 kg/m. Ideal body weight: 50 kg (110 lb 3.7  oz) Adjusted ideal body weight: 66.1 kg (145 lb 11.8 oz)  BMI Readings from Last 4 Encounters:  04/13/18 38.86 kg/m  03/29/18 40.91 kg/m  03/11/18 38.81 kg/m  03/08/18 42.48 kg/m   Wt Readings from Last 4 Encounters:  04/13/18 199 lb (90.3 kg)  03/29/18 206 lb (93.4 kg)  03/11/18 205 lb 6.4 oz (93.2 kg)  03/08/18 224 lb 12.8 oz (102 kg)

## 2018-04-13 NOTE — Progress Notes (Signed)
Nursing Pain Medication Assessment:  Safety precautions to be maintained throughout the outpatient stay will include: orient to surroundings, keep bed in low position, maintain call bell within reach at all times, provide assistance with transfer out of bed and ambulation.  Medication Inspection Compliance: Pill count conducted under aseptic conditions, in front of the patient. Neither the pills nor the bottle was removed from the patient's sight at any time. Once count was completed pills were immediately returned to the patient in their original bottle.  Medication: Oxycodone IR Pill/Patch Count: 51 of 90 pills remain Pill/Patch Appearance: Markings consistent with prescribed medication Bottle Appearance: Standard pharmacy container. Clearly labeled. Filled Date: 8 / 1 / 2019 Last Medication intake:  Today

## 2018-04-15 ENCOUNTER — Telehealth: Payer: Self-pay | Admitting: *Deleted

## 2018-04-15 NOTE — Telephone Encounter (Signed)
Hi Cindy did you fax that prescription over?

## 2018-04-15 NOTE — Telephone Encounter (Signed)
No I gave it to the patient. I didn't know I was supposed to fax it. I told him to mail it.  Do  We need to send one.

## 2018-04-26 DIAGNOSIS — E785 Hyperlipidemia, unspecified: Secondary | ICD-10-CM

## 2018-04-26 DIAGNOSIS — E1169 Type 2 diabetes mellitus with other specified complication: Secondary | ICD-10-CM | POA: Insufficient documentation

## 2018-04-28 ENCOUNTER — Encounter: Payer: Self-pay | Admitting: Physician Assistant

## 2018-04-28 ENCOUNTER — Ambulatory Visit (INDEPENDENT_AMBULATORY_CARE_PROVIDER_SITE_OTHER): Payer: Medicare HMO | Admitting: Physician Assistant

## 2018-04-28 VITALS — BP 124/64 | HR 66 | Ht 61.0 in | Wt 203.2 lb

## 2018-04-28 DIAGNOSIS — I5032 Chronic diastolic (congestive) heart failure: Secondary | ICD-10-CM

## 2018-04-28 DIAGNOSIS — Z9989 Dependence on other enabling machines and devices: Secondary | ICD-10-CM

## 2018-04-28 DIAGNOSIS — I25810 Atherosclerosis of coronary artery bypass graft(s) without angina pectoris: Secondary | ICD-10-CM | POA: Diagnosis not present

## 2018-04-28 DIAGNOSIS — Z8673 Personal history of transient ischemic attack (TIA), and cerebral infarction without residual deficits: Secondary | ICD-10-CM

## 2018-04-28 DIAGNOSIS — I482 Chronic atrial fibrillation, unspecified: Secondary | ICD-10-CM

## 2018-04-28 DIAGNOSIS — E876 Hypokalemia: Secondary | ICD-10-CM

## 2018-04-28 DIAGNOSIS — Z952 Presence of prosthetic heart valve: Secondary | ICD-10-CM

## 2018-04-28 DIAGNOSIS — G4733 Obstructive sleep apnea (adult) (pediatric): Secondary | ICD-10-CM

## 2018-04-28 LAB — BASIC METABOLIC PANEL
BUN/Creatinine Ratio: 24 — ABNORMAL HIGH (ref 9–20)
BUN: 23 mg/dL (ref 6–24)
CALCIUM: 8.5 mg/dL — AB (ref 8.7–10.2)
CO2: 32 mmol/L — ABNORMAL HIGH (ref 20–29)
CREATININE: 0.95 mg/dL (ref 0.76–1.27)
Chloride: 92 mmol/L — ABNORMAL LOW (ref 96–106)
GFR, EST AFRICAN AMERICAN: 103 mL/min/{1.73_m2} (ref >59)
GFR, EST NON AFRICAN AMERICAN: 89 mL/min/{1.73_m2} (ref >59)
Glucose: 105 mg/dL — ABNORMAL HIGH (ref 65–99)
Potassium: 4 mmol/L (ref 3.5–5.2)
Sodium: 139 mmol/L (ref 134–144)

## 2018-04-28 NOTE — Progress Notes (Signed)
Cardiology Office Note    Date:  04/29/2018   ID:  Lance Cook, DOB 1961/03/10, MRN [id number]  PCP:  [name], PA-C  Cardiologist:  Dr. [name]  Chief Complaint  Patient presents with  . Follow-up    pt mentions muscle spasm in chest, mentions SOB with activites, denies swelling    History of Present Illness:  Lance Cook is a 57 y.o. male with PMH of AVR (s/p Bentall procedure in 1988), severe mitral regurgitation (s/p St Jude mechanical valve placed 12/2014), CAD s/p SVG-RCA which is known to be occluded by cath in April 2016, chronic atrial fibrillation, chronic diastolic heart failure with baseline EF 65 to 70%, prior CVA, chronic venous insufficiency, obstructive sleep apnea on CPAP and recurrent C. difficile infection.  Patient was last seen by Dr. [name] on 02/26/2018 at which time he has had continued increased lower extremity swelling with 5 pound weight gain.  He also had cramps as well which is relieved by muscle relaxant.  CK was normal.  His diuretic was switched from 80 mg twice daily of Lasix to 60 mg twice daily of torsemide.  He was seen back by Fabian Sharp PA-C on 03/08/2018, at which time he continued to have worsening lower extremity edema.  It appears Levada Dy has recommended the patient to be admitted to the hospital for IV diuresis, however patient declined to do so because of upcoming fishing trip.  He was reverted back to 80 mg twice daily of Lasix with additional 5 mg Zaroxolyn for 3 days.  I last saw the patient on 03/11/2018, after 3 days of metolazone, he lost roughly 20 pounds of weight.  He was 205 pounds at the time.  His lower extremity edema also has significantly improved as well.  I switched to Zaroxolyn to once weekly dosing.  Patient presents today for cardiology office visit.  He is quite compliant with once weekly dosing of metolazone, however he does dictate which day he take the metolazone in the week based on his weight.  This seems to have worked out  very well for him.  He usually take the metolazone when his weight is reaches about 205, and this will drop his weight back down to the 197 pounds.  His weight has been quite stable based on the home weight chart.  He denies any significant shortness of breath except during exertion which is quite chronic for him.  He does have a left bicep and chest muscle spasm when he raises his arm for too long.  This does not sound cardiac in nature.  It does not occur with normal ambulation and only related to the position of the arm.  His potassium was low 2 days ago at 3.1, he did take extra dose of potassium, we will check using a basic metabolic panel today.  I instructed him to take additional 20 mEq of potassium on the day he take metolazone.  Otherwise he can take 20 mg twice daily as he has been taking previously.  He will also need a repeat basic metabolic panel when he goes to his Coumadin clinic on 05/05/2018 to follow-up on the renal function and potassium level.  He can follow-up with Dr. [name] in October.    Past Medical History:  Diagnosis Date  . Acute on chronic diastolic CHF (congestive heart failure) ([place]) 08/27/2016  . Anemia   . Anxiety   . ARDS (adult respiratory distress syndrome) ([place]) 01/27/2015  . Arthritis   . Ascending  aortic dissection ([place]) 07/14/2008   Localized dissection of ascending aorta noted on CTA in 2009 and stable on CTA in 2011  . Asymptomatic chronic venous hypertension 01/15/2010   Overview:  Overview:  Qualifier: Diagnosis of  By: [name] MD, [name]   Last Assessment & Plan:  I suggested he go to a vein clinic to see if he could be qualified compression hose of some type, since he is not a surgical candidate according to the vascular surgeons.   . Atrial fibrillation ([place])    chronic persistent  . Bell's palsy   . C. difficile diarrhea   . CAD (coronary artery disease)    Old scar inferior wall myoview, 10/2009 EF 52%.  He did have previous SVG to RCA but no  obstructive disease noted on his most recent cath.  SVG occluded.   . Cellulitis 04/02/2014  . Cerebral artery occlusion with cerebral infarction ([place]) 10/08/2011   Overview:  Overview:  And hx of TIA prior to CABG, all thought due to systemic emboli prior to coumadin   . Chronic diastolic heart failure ([place]) 03/01/2015   a. 12/2015: echo showing a preserved EF of 65-70%, moderate AS, and moderate TR.   Marland Kitchen Chronic LBP 10/08/2011  . CVA (cerebral vascular accident) ([place]) 10/08/2011   And hx of TIA prior to CABG, all thought due to systemic emboli prior to coumadin   . DIABETES MELLITUS, TYPE II 11/01/2009   Qualifier: Diagnosis of  By: [name] MD, [name]    . Diverticulosis   . 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 Encephalopathy acute 02/05/2015  . Gastric polyps   . GERD (gastroesophageal reflux disease)    not needing medication at thhis time- 01/22/15  . Gout   . H/O mechanical aortic valve replacement 11/01/2009   Qualifier: Diagnosis of  By: [name], [name]    . Hiatal hernia   . History of colon polyps 12/23/2011   Overview:  Overview:  Colonoscopy January 2010, 6 mm rectal tubulovillous adenoma. No high-grade dysplasia   . HTN (hypertension) 02/23/2014  . Hyperlipidemia   . Hypertension   . Hypogonadism male 04/08/2012  . Impaired glucose tolerance 10/08/2011  . Morbid obesity ([place]) 04/02/2014  . Myocardial infarction [facility]) age 89  . OSA (obstructive sleep apnea)    CPAP  . Peripheral vascular disease ([place])   . 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 2/70/3500   33 mm St Jude bileaflet mechanical valve placed via right mini thoracotomy approach  . 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    1988 - Dr Blase Mess at [facility] in [place], [place]  . Severe mitral regurgitation 10/11/2014    . Testicular cancer [facility])    He was 57 y/o. He had surgical resection and rad tx's.   Marland Kitchen TIA (transient ischemic attack)    age 23  . Tubulovillous adenoma of colon   . Varicose veins   . 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 Venous (peripheral) insufficiency 02/03/2005    Past Surgical History:  Procedure Laterality Date  . aortic truck    . BENTALL PROCEDURE  1988   25 mm St Jude mechanical valve conduit - Dr Blase Mess at [facility] in [place], [place]  . CHOLECYSTECTOMY  2011  . COLONOSCOPY WITH PROPOFOL N/A 06/17/2016   Procedure: COLONOSCOPY WITH PROPOFOL;  Surgeon: Jerene Bears, MD;  Location: [facility] ENDOSCOPY;  Service: Gastroenterology;  Laterality: N/A;  . CORONARY ARTERY BYPASS GRAFT    . LASER ABLATION Left 03/06/2010   leg  . MITRAL VALVE REPLACEMENT Right 01/24/2015   Procedure: Re-Operation, MINIMALLY INVASIVE MITRAL VALVE (MV) REPLACEMENT.;  Surgeon: Rexene Alberts, MD;  Location: [place];  Service: Open Heart Surgery;  Laterality: Right;  . ORCHIECTOMY  age 62   testicular cancer  . OTOPLASTY     bilateral, age 79  . TEE WITHOUT CARDIOVERSION N/A 10/11/2014   Procedure: TRANSESOPHAGEAL ECHOCARDIOGRAM (TEE);  Surgeon: Pixie Casino, MD;  Location: [facility] ENDOSCOPY;  Service: Cardiovascular;  Laterality: N/A;  . TEE WITHOUT CARDIOVERSION N/A 01/24/2015   Procedure: TRANSESOPHAGEAL ECHOCARDIOGRAM (TEE);  Surgeon: Rexene Alberts, MD;  Location: [place];  Service: Open Heart Surgery;  Laterality: N/A;  . TONSILLECTOMY  1967  . TOOTH EXTRACTION  2016    Current Medications: Outpatient Medications Prior to Visit  Medication Sig Dispense Refill  . acetaminophen (TYLENOL) 500 MG tablet Take 500 mg by mouth every 8 (eight) hours as needed for moderate pain or headache.     . allopurinol (ZYLOPRIM) 300 MG tablet Take 300 mg by mouth daily.    Marland Kitchen atenolol (TENORMIN) 25 MG tablet Take 1 tablet (25 mg total)  by mouth 2 (two) times daily.    . digoxin (LANOXIN) 0.25 MG tablet Take 1 tablet (250 mcg total) by mouth daily. 90 tablet 2  . furosemide (LASIX) 80 MG tablet Take 1 tablet (80 mg total) by mouth 2 (two) times daily. 60 tablet 1  . magnesium oxide (MAG-OX) 400 (241.3 MG) MG tablet Take 1 tablet (400 mg total) by mouth 2 (two) times daily. 180 tablet 3  . [START ON 04/30/2018] methocarbamol (ROBAXIN) 500 MG tablet Take 1 tablet (500 mg total) by mouth 3 (three) times daily. 270 tablet 0  . metolazone (ZAROXOLYN) 5 MG tablet Take 1 tablet (5 mg total) by mouth once a week. 14 tablet 0  . [START ON 06/29/2018] oxyCODONE (OXY IR/ROXICODONE) 5 MG immediate release tablet Take 1 tablet (5 mg total) by mouth every 8 (eight) hours as needed for severe pain. 90 tablet 0  . [START ON 05/30/2018] oxyCODONE (OXY IR/ROXICODONE) 5 MG immediate release tablet Take 1 tablet (5 mg total) by mouth every 8 (eight) hours as needed for moderate pain or severe pain. 90 tablet 0  . potassium chloride (K-DUR) 10 MEQ tablet TAKE 2 TABLETS BY MOUTH TWICE DAILY...(DUE FOR FOLLOW UP APPT) 60 tablet 6  . traZODone (DESYREL) 50 MG tablet Take 2 tablets by mouth at bedtime.     Marland Kitchen warfarin (COUMADIN) 5 MG tablet Take 5-7.5 mg by mouth See admin instructions. Take 1 and 1/2 tablets on Sunday, Tuesday and Thursday then take 1 tablet all the other days    . [START ON 04/30/2018] oxyCODONE (OXY IR/ROXICODONE) 5 MG immediate release tablet Take 1 tablet (5 mg total) by mouth every 8 (eight) hours as needed for severe pain. 90 tablet 0   No facility-administered medications prior to visit.      Allergies:   Penicillins; Adhesive [tape]; and Bactrim [sulfamethoxazole-trimethoprim]   Social History   Socioeconomic History  . Marital status: Married    Spouse name: Not on file  . Number of children: 0  . Years of education: Not on file  . Highest education  level: Not on file  Occupational History  . Occupation: Disabled Surveyor, quantity: UNEMPLOYED  Social Needs  . Financial resource strain: Not on file  . Food insecurity:    Worry: Not on file    Inability: Not on file  . Transportation needs:    Medical: Not on file    Non-medical: Not on file  Tobacco Use  . Smoking status: Light Tobacco Smoker    Types: Cigars  . Smokeless tobacco: Never Used  . Tobacco comment: about 3 yearly- cigar  Substance and Sexual Activity  . Alcohol use: Yes    Alcohol/week: 0.0 standard drinks    Comment: social  . Drug use: No  . Sexual activity: Not on file  Lifestyle  . Physical activity:    Days per week: Not on file    Minutes per session: Not on file  . Stress: Not on file  Relationships  . Social connections:    Talks on phone: Not on file    Gets together: Not on file    Attends religious service: Not on file    Active member of club or organization: Not on file    Attends meetings of clubs or organizations: Not on file    Relationship status: Not on file  Other Topics Concern  . Not on file  Social History Narrative  . Not on file     Family History:  The patient's family history includes Cancer in his maternal aunt and maternal uncle; Diabetes in his father; Heart disease in his mother; Hypertension in his other; Other in his mother; Stroke in his other; Throat cancer in his mother.   ROS:   Please see the history of present illness.    ROS All other systems reviewed and are negative.   PHYSICAL EXAM:   VS:  BP 124/64 (BP Location: Right Arm, Patient Position: Sitting)   Pulse 66   Ht 5\' 1"  (1.549 m)   Wt 203 lb 3.2 oz (92.2 kg)   BMI 38.39 kg/m    GEN: Well nourished, well developed, in no acute distress  HEENT: normal  Neck: no JVD, carotid bruits, or masses Cardiac: Irregularly irregular; no rubs, or gallops.  Lower extremity woody appearance.  3 out of 6 systolic murmur near the apex Respiratory:  clear to auscultation bilaterally, normal work of breathing GI: soft, nontender,  nondistended, + BS MS: no deformity or atrophy  Skin: warm and dry, no rash Neuro:  Alert and Oriented x 3, Strength and sensation are intact Psych: euthymic mood, full affect  Wt Readings from Last 3 Encounters:  04/28/18 203 lb 3.2 oz (92.2 kg)  04/13/18 199 lb (90.3 kg)  03/29/18 206 lb (93.4 kg)      Studies/Labs Reviewed:   EKG:  EKG is ordered today.  The ekg ordered today demonstrates atrial fibrillation, rate controlled, chronic ST downsloping in lead I and aVL, more prominent ST downsloping in V5 and V6 compared to the previous EKG.  Recent Labs: 01/14/2018: ALT 16; B Natriuretic Peptide 142.5; Hemoglobin 11.3; Platelets 241 01/28/2018: Magnesium 2.0; TSH 2.890 04/28/2018: BUN 23; Creatinine, Ser 0.95; Potassium 4.0; Sodium 139   Lipid Panel    Component Value Date/Time   CHOL 118 08/18/2012 1137   TRIG 133.0 08/18/2012 1137   HDL 27.20 (L) 08/18/2012 1137   CHOLHDL 4 08/18/2012 1137   VLDL 26.6 08/18/2012 1137   LDLCALC 64 08/18/2012 1137    Additional studies/ records that were  reviewed today include:   Echo 02/03/2018 LV EF: 55% - 60% Study Conclusions  - Left ventricle: The cavity size was normal. Wall thickness was increased in a pattern of mild LVH. Systolic function was normal. The estimated ejection fraction was in the range of 55% to 60%. - Aortic valve: Mechanical St Jude AVR with stable gradients since 2017 and no significant peri valvular regurgitation. - Mitral valve: Normal appearing mechanical St Jude MVR. - Left atrium: The atrium was severely dilated. - Atrial septum: No defect or patent foramen ovale was identified. - Tricuspid valve: There was moderate regurgitation. - Pulmonary arteries: PA peak pressure: 64 mm Hg (S).   ASSESSMENT:    1. Chronic diastolic heart failure ([place])   2. Chronic atrial fibrillation ([facility])   3. H/O mitral valve replacement with mechanical valve   4. S/P AVR   5. Coronary artery disease involving  coronary bypass graft of native heart without angina pectoris   6. H/O: CVA (cerebrovascular accident)   7. OSA on CPAP   8. Hypokalemia      PLAN:  In order of problems listed above:  1. Chronic diastolic heart failure: Well-controlled on the current diuretic and metolazone dose.  We will continue on the current therapy.  He is on once weekly dose of metolazone.  2. Hypokalemia: I instructed the patient to take additional 20 mEq of potassium on the day he take the metolazone.    3. Chronic atrial fibrillation: On digoxin and atenolol.  Heart rate well controlled  4. CAD s/p SVG to RCA: Known to be occluded on previous cardiac catheterization.  No anginal symptom  5. History of St Jude AVR: On Coumadin, loud murmur noted on exam.  Stable on last echocardiogram in June 2019 with no significant perivalvular regurgitation.  6. History of CVA: No recurrence    Medication Adjustments/Labs and Tests Ordered: Current medicines are reviewed at length with the patient today.  Concerns regarding medicines are outlined above.  Medication changes, Labs and Tests ordered today are listed in the Patient Instructions below. Patient Instructions  Medication Instructions:  Your physician has recommended you make the following change in your medication:  Take 57mEq of Potassium on the days that you take [place] BMET today  Your physician recommends that you return for lab work on 05/05/18 @ the [employer] street office ( [place])   Testing/Procedures: None ordered  Follow-Up: Follow up as planned with Dr.[name] in October 2019  Any Other Special Instructions Will Be Listed Below (If Applicable).     If you need a refill on your cardiac medications before your next appointment, please call your pharmacy.      Hilbert Corrigan, [place]  04/29/2018 1:10 PM    [facility] [place], [place], [place]  [address] Phone: [phone]; Fax: [phone]

## 2018-04-28 NOTE — Patient Instructions (Addendum)
Medication Instructions:  Your physician has recommended you make the following change in your medication:  Take 49mEq of Potassium on the days that you take [place] BMET today  Your physician recommends that you return for lab work on 05/05/18 @ the [employer] street office ( [place])   Testing/Procedures: None ordered  Follow-Up: Follow up as planned with Dr.[name] in October 2019  Any Other Special Instructions Will Be Listed Below (If Applicable).     If you need a refill on your cardiac medications before your next appointment, please call your pharmacy.

## 2018-04-29 ENCOUNTER — Encounter: Payer: Self-pay | Admitting: Physician Assistant

## 2018-05-04 MED FILL — DIGOXIN 250 MCG TABLET: 250 | 30 days supply | Qty: 30 | Fill #2

## 2018-05-04 MED FILL — METHOCARBAMOL 500 MG TABLET: 500 | 90 days supply | Qty: 270 | Fill #0

## 2018-05-05 ENCOUNTER — Other Ambulatory Visit: Payer: Self-pay

## 2018-05-06 ENCOUNTER — Encounter: Payer: Self-pay | Admitting: Internal Medicine

## 2018-05-06 ENCOUNTER — Ambulatory Visit: Payer: Medicare HMO | Admitting: Internal Medicine

## 2018-05-06 DIAGNOSIS — I5032 Chronic diastolic (congestive) heart failure: Secondary | ICD-10-CM

## 2018-05-06 DIAGNOSIS — G4733 Obstructive sleep apnea (adult) (pediatric): Secondary | ICD-10-CM | POA: Diagnosis not present

## 2018-05-06 NOTE — Patient Instructions (Addendum)
Order- [facility]-  Please replace old CPAP machine, auto 5-20, humidifier, AirView   Dx OSA             Please replace mask of choice, humidifier, supplies,  There has been no break in therapy.

## 2018-05-06 NOTE — Progress Notes (Signed)
05/06/2018-57 year old male cigar smoker for sleep evaluation. Sleep Consult: former patient 2013. DME: [facility]. Pt states he wears CPAP nightly and needs new one as his is giving him trouble-older machine. Original study was done at [facility] heart- remote. NPSG 03/14/12- severe obstructive sleep apnea. AHI 53.5 per hour with successful CPAP titration to 15 CWP, AHI 0 per hour. Problem list includes acute on chronic CHF, iron deficiency anemia, history ARDS, a sending aortic dissection, A. fib chronic, CAD my, Valve replacement, CVA, chronic anticoagulation, DM 2,, peripheral venous insufficiency, Current machine is more than 57 years old and has given warning message that motor needs repair.  Sleep is also disturbed by diuretics and somatic discomfort. Denies acute respiratory issues otherwisebut asks my thoughts about aromatherapy and having a small personal oxygen tank available to use if needed. Usually dyspnea is related to fluid retention. Epworth score 20  Prior to Admission medications   Medication Sig Start Date End Date Taking? Authorizing Provider  acetaminophen (TYLENOL) 500 MG tablet Take 500 mg by mouth every 8 (eight) hours as needed for moderate pain or headache.    Yes [provider]  allopurinol (ZYLOPRIM) 300 MG tablet Take 300 mg by mouth daily. 11/17/12  Yes Biagio Borg, MD  atenolol (TENORMIN) 25 MG tablet Take 1 tablet (25 mg total) by mouth 2 (two) times daily. 09/30/16  Yes Strader, [place] M, PA-C  digoxin (LANOXIN) 0.25 MG tablet Take 1 tablet (250 mcg total) by mouth daily. 11/23/17  Yes [name], MD  furosemide (LASIX) 80 MG tablet Take 1 tablet (80 mg total) by mouth 2 (two) times daily. 03/08/18 06/06/18 Yes Duke, Tami Lin, PA  magnesium oxide (MAG-OX) 400 (241.3 MG) MG tablet Take 1 tablet (400 mg total) by mouth 2 (two) times daily. 06/04/15  Yes [name], MD  methocarbamol (ROBAXIN) 500 MG tablet Take 1 tablet (500 mg total) by mouth 3 (three)  times daily. 04/30/18 07/29/18 Yes King, Diona Foley, NP  metolazone (ZAROXOLYN) 5 MG tablet Take 1 tablet (5 mg total) by mouth once a week. 03/11/18  Yes Almyra Deforest, PA  NON FORMULARY LIDO5/DICLO3/CYCLO2/GABA 2  Apply as directed as needed   Yes [provider]  oxyCODONE (OXY IR/ROXICODONE) 5 MG immediate release tablet Take 1 tablet (5 mg total) by mouth every 8 (eight) hours as needed for severe pain. 06/29/18 07/29/18 Yes King, Diona Foley, NP  potassium chloride (K-DUR) 10 MEQ tablet TAKE 2 TABLETS BY MOUTH TWICE DAILY...(DUE FOR FOLLOW UP APPT) 04/12/18  Yes [name], MD  traZODone (DESYREL) 50 MG tablet Take 2 tablets by mouth at bedtime.  11/20/17  Yes [provider]  warfarin (COUMADIN) 5 MG tablet Take 5-7.5 mg by mouth See admin instructions. Take 1 and 1/2 tablets on Sunday, Tuesday and Thursday then take 1 tablet all the other days   Yes [provider]   Past Medical History:  Diagnosis Date  . Acute on chronic diastolic CHF (congestive heart failure) ([place]) 08/27/2016  . Anemia   . Anxiety   . ARDS (adult respiratory distress syndrome) ([place]) 01/27/2015  . Arthritis   . Ascending aortic dissection ([place]) 07/14/2008   Localized dissection of ascending aorta noted on CTA in 2009 and stable on CTA in 2011  . Asymptomatic chronic venous hypertension 01/15/2010   Overview:  Overview:  Qualifier: Diagnosis of  By: [name] MD, Elizabeth   Last Assessment & Plan:  I suggested he go to a vein clinic to see if he could  be qualified compression hose of some type, since he is not a surgical candidate according to the vascular surgeons.   . Atrial fibrillation ([place])    chronic persistent  . Bell's palsy   . C. difficile diarrhea   . CAD (coronary artery disease)    Old scar inferior wall myoview, 10/2009 EF 52%.  He did have previous SVG to RCA but no obstructive disease noted on his most recent cath.  SVG occluded.   . Cellulitis 04/02/2014  . Cerebral artery  occlusion with cerebral infarction ([place]) 10/08/2011   Overview:  Overview:  And hx of TIA prior to CABG, all thought due to systemic emboli prior to coumadin   . Chronic diastolic heart failure ([place]) 03/01/2015   a. 12/2015: echo showing a preserved EF of 65-70%, moderate AS, and moderate TR.   Marland Kitchen Chronic LBP 10/08/2011  . CVA (cerebral vascular accident) ([place]) 10/08/2011   And hx of TIA prior to CABG, all thought due to systemic emboli prior to coumadin   . DIABETES MELLITUS, TYPE II 11/01/2009   Qualifier: Diagnosis of  By: [name] MD, [name]    . Diverticulosis   . 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 Encephalopathy acute 02/05/2015  . Gastric polyps   . GERD (gastroesophageal reflux disease)    not needing medication at thhis time- 01/22/15  . Gout   . H/O mechanical aortic valve replacement 11/01/2009   Qualifier: Diagnosis of  By: [name], [name]    . Hiatal hernia   . History of colon polyps 12/23/2011   Overview:  Overview:  Colonoscopy January 2010, 6 mm rectal tubulovillous adenoma. No high-grade dysplasia   . HTN (hypertension) 02/23/2014  . Hyperlipidemia   . Hypertension   . Hypogonadism male 04/08/2012  . Impaired glucose tolerance 10/08/2011  . Morbid obesity ([place]) 04/02/2014  . Myocardial infarction [facility]) age 84  . OSA (obstructive sleep apnea)    CPAP  . Peripheral vascular disease ([place])   . 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 0/10/7046   33 mm St Jude bileaflet mechanical valve placed via right mini thoracotomy approach  . 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    1988 - Dr Blase Mess at [facility] in [place], [place]  . Severe mitral regurgitation 10/11/2014  . Testicular cancer [facility])    He was 57 y/o. He had surgical resection and rad tx's.   Marland Kitchen TIA (transient  ischemic attack)    age 78  . Tubulovillous adenoma of colon   . Varicose veins   . 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 Venous (peripheral) insufficiency 02/03/2005   Past Surgical History:  Procedure Laterality Date  . aortic truck    . BENTALL PROCEDURE  1988   25 mm St Jude mechanical valve conduit - Dr Blase Mess at [facility] in [place], [place]  . CHOLECYSTECTOMY  2011  . COLONOSCOPY WITH PROPOFOL N/A 06/17/2016   Procedure: COLONOSCOPY WITH PROPOFOL;  Surgeon: Jerene Bears, MD;  Location: [facility] END[facility];  Service: Gastroenterology;  Laterality: N/A;  . CORONARY ARTERY BYPASS GRAFT    . LASER ABLATION Left 03/06/2010   leg  . MITRAL VALVE REPLACEMENT Right 01/24/2015   Procedure: Re-Operation, MINIMALLY INVASIVE MITRAL VALVE (MV) REPLACEMENT.;  Surgeon: Rexene Alberts, MD;  Location: [place];  Service: Open Heart Surgery;  Laterality: Right;  . ORCHIECTOMY  age 71   testicular cancer  . OTOPLASTY     bilateral, age 38  . TEE WITHOUT CARDIOVERSION N/A 10/11/2014   Procedure: TRANSESOPHAGEAL ECHOCARDIOGRAM (TEE);  Surgeon: Pixie Casino, MD;  Location: [facility] ENDOSCOPY;  Service: Cardiovascular;  Laterality: N/A;  . TEE WITHOUT CARDIOVERSION N/A 01/24/2015   Procedure: TRANSESOPHAGEAL ECHOCARDIOGRAM (TEE);  Surgeon: Rexene Alberts, MD;  Location: [place];  Service: Open Heart Surgery;  Laterality: N/A;  . TONSILLECTOMY  1967  . TOOTH EXTRACTION  2016   Family History  Problem Relation Age of Onset  . Heart disease Mother   . Throat cancer Mother   . Other Mother        bowel obstruction  . Diabetes Father   . Stroke Other   . Hypertension Other   . Cancer Maternal Aunt        lung, brain  . Cancer Maternal Uncle        brain aneurysm  . Colon cancer Neg Hx    Social History   Socioeconomic History  . Marital status: Married    Spouse name: Not on file  . Number of children: 0  . Years of  education: Not on file  . Highest education level: Not on file  Occupational History  . Occupation: Disabled Environmental education officer: UNEMPLOYED  Social Needs  . Financial resource strain: Not on file  . Food insecurity:    Worry: Not on file    Inability: Not on file  . Transportation needs:    Medical: Not on file    Non-medical: Not on file  Tobacco Use  . Smoking status: Light Tobacco Smoker    Types: Cigars  . Smokeless tobacco: Never Used  . Tobacco comment: about 3 yearly- cigar  Substance and Sexual Activity  . Alcohol use: Yes    Alcohol/week: 0.0 standard drinks    Comment: social  . Drug use: No  . Sexual activity: Not on file  Lifestyle  . Physical activity:    Days per week: Not on file    Minutes per session: Not on file  . Stress: Not on file  Relationships  . Social connections:    Talks on phone: Not on file    Gets together: Not on file    Attends religious service: Not on file    Active member of club or organization: Not on file    Attends meetings of clubs or organizations: Not on file    Relationship status: Not on file  . Intimate partner violence:    Fear of current or ex partner: Not on file    Emotionally abused: Not on file    Physically abused: Not on file    Forced sexual activity: Not on file  Other Topics Concern  . Not on file  Social History Narrative  . Not on file   ROS-see HPI   + = positive Constitutional:    weight loss, night sweats, fevers, chills, fatigue, lassitude. HEENT:    headaches, difficulty swallowing, tooth/dental problems, sore throat,       sneezing, itching, ear ache, nasal congestion, post nasal drip, snoring CV:    chest pain, orthopnea, PND, swelling in lower extremities, anasarca,  dizziness, palpitations Resp:   +shortness of breath with exertion or at rest.                productive cough,   non-productive cough, coughing up of blood.              change in color of mucus.   wheezing.   Skin:    rash or lesions. GI:  No-   heartburn, indigestion, abdominal pain, nausea, vomiting, diarrhea,                 change in bowel habits, loss of appetite GU: dysuria, change in color of urine, no urgency or frequency.   flank pain. MS:   joint pain, stiffness, decreased range of motion, back pain. Neuro-     nothing unusual Psych:  change in mood or affect.  depression or anxiety.   memory loss.  OBJ- Physical Exam General- Alert, Oriented, Affect-appropriate, Distress- none acute, + overweight Skin- rash-none, lesions- none, excoriation- none Lymphadenopathy- none Head- atraumatic            Eyes- Gross vision intact, PERRLA, conjunctivae and secretions clear            Ears- Hearing, canals-normal            Nose- Clear, no-Septal dev, mucus, polyps, erosion, perforation             Throat- Mallampati II-III , mucosa clear , drainage- none, tonsils- atrophic Neck- flexible , trachea midline, no stridor , thyroid nl, carotid no bruit Chest - symmetrical excursion , unlabored           Heart/CV- IRR+/ AFib , no murmur, +2 crisp valve clicks , no gallop  , no rub, nl s1 s2                           - JVD- none , edema- none, stasis changes- none, varices- none           Lung- + rales L base, wheeze- none, cough- none , dullness-none, rub- none           Chest wall-  Abd-  Br/ Gen/ Rectal- Not done, not indicated Extrem- cyanosis- none, clubbing, none, atrophy- none, strength- nl Neuro- grossly intact to observation

## 2018-05-07 NOTE — Assessment & Plan Note (Signed)
Discussed compliance and control goals.  He has been very compliant with CPAP without break in therapy.  Machine now is worn out.  We discussed change to AutoPap. Plan-replace old machine, changing to AutoPap 5-20

## 2018-05-07 NOTE — Assessment & Plan Note (Signed)
We discussed relationship of exertional dyspnea to status of his heart failure control.  He understands this.  I do not think a small personal oxygen tank would offer much other than placebo support, but would not likely be harmful for occasional use.  I was not supportive of his desire to use aromatherapy.  I encouraged him to reconsider his refusal of flu vaccine.

## 2018-05-10 ENCOUNTER — Other Ambulatory Visit: Payer: Self-pay

## 2018-05-12 ENCOUNTER — Other Ambulatory Visit: Payer: Medicare HMO | Admitting: *Deleted

## 2018-05-12 ENCOUNTER — Telehealth: Payer: Self-pay | Admitting: Internal Medicine

## 2018-05-12 ENCOUNTER — Telehealth: Payer: Self-pay | Admitting: Physician Assistant

## 2018-05-12 ENCOUNTER — Ambulatory Visit (INDEPENDENT_AMBULATORY_CARE_PROVIDER_SITE_OTHER): Payer: Medicare HMO | Admitting: Pharmacist

## 2018-05-12 DIAGNOSIS — I4891 Unspecified atrial fibrillation: Secondary | ICD-10-CM | POA: Diagnosis not present

## 2018-05-12 DIAGNOSIS — Z5181 Encounter for therapeutic drug level monitoring: Secondary | ICD-10-CM

## 2018-05-12 DIAGNOSIS — G4733 Obstructive sleep apnea (adult) (pediatric): Secondary | ICD-10-CM

## 2018-05-12 DIAGNOSIS — I5032 Chronic diastolic (congestive) heart failure: Secondary | ICD-10-CM

## 2018-05-12 LAB — POCT INR: INR: 1.4 — AB (ref 2.0–3.0)

## 2018-05-12 MED ORDER — METOLAZONE 5 MG PO TABS: 5.0000 mg | ORAL_TABLET | ORAL | 3 refills | Status: DC

## 2018-05-12 NOTE — Telephone Encounter (Signed)
New Message         *STAT* If patient is at the pharmacy, call can be transferred to refill team.   1. Which medications need to be refilled? (please list name of each medication and dose if known) metolazone (ZAROXOLYN) 5 MG tablet  2. Which pharmacy/location (including street and city if local pharmacy) is medication to be sent to?  [facility] [place], [address] AT [address] [phone] (Phone) [phone] (Fax)     3. Do they need a 30 day or 90 day supply? [place]

## 2018-05-12 NOTE — Telephone Encounter (Signed)
This is Dr. [name]'s pt. °

## 2018-05-12 NOTE — Patient Instructions (Signed)
Description   Take 2 tablets today, 2 tablets tomorrow, and 1.5 tablets on Friday, then resume normal dose of 1 tablet daily except 1.5 tablets on Sundays, Tuesdays and Thursdays.  Recheck INR in 1 week - will coordinate Lovenox bridge at that visit for upcoming colonoscopy. Call us with any medication changes or procedure to coumadin Clinic @ [phone]

## 2018-05-12 NOTE — Telephone Encounter (Signed)
Rx(s) sent to pharmacy electronically.  

## 2018-05-12 NOTE — Telephone Encounter (Signed)
Called and spoke with patient, he is aware that I have marked this urgent to have it done ASAP. Advised patient he should hear from [facility] this week.   Order for new CPAP was never placed at [facility] on 9.5.19. Order has been placed and marked urgent.

## 2018-05-13 LAB — BASIC METABOLIC PANEL
BUN/Creatinine Ratio: 28 — ABNORMAL HIGH (ref 9–20)
BUN: 24 mg/dL (ref 6–24)
CALCIUM: 8.4 mg/dL — AB (ref 8.7–10.2)
CHLORIDE: 92 mmol/L — AB (ref 96–106)
CO2: 32 mmol/L — AB (ref 20–29)
Creatinine, Ser: 0.87 mg/dL (ref 0.76–1.27)
GFR calc non Af Amer: 96 mL/min/{1.73_m2} (ref >59)
GFR, EST AFRICAN AMERICAN: 112 mL/min/{1.73_m2} (ref >59)
Glucose: 102 mg/dL — ABNORMAL HIGH (ref 65–99)
Potassium: 3.9 mmol/L (ref 3.5–5.2)
SODIUM: 139 mmol/L (ref 134–144)

## 2018-05-17 ENCOUNTER — Telehealth: Payer: Self-pay | Admitting: Internal Medicine

## 2018-05-17 NOTE — Telephone Encounter (Signed)
Called and spoke with patient, patient is upset that [facility] has not been out to fix his machine, patient states he is at risk of having a stroke if his cpap is not fixed immediately. He states he is very frustrated that he is the only one that seems to be aware of how urgent the situation is. Apologized to patient for the wait and assured the patient it is important to [place] as well and I would touch bases with [facility] to see where we are in the process. Per [facility] supplies have been ordered and they will be contacting him today or tomorrow. Patient made aware.

## 2018-05-20 ENCOUNTER — Telehealth: Payer: Self-pay | Admitting: Internal Medicine

## 2018-05-20 ENCOUNTER — Ambulatory Visit (INDEPENDENT_AMBULATORY_CARE_PROVIDER_SITE_OTHER): Payer: Medicare HMO | Admitting: *Deleted

## 2018-05-20 ENCOUNTER — Other Ambulatory Visit: Payer: Self-pay | Admitting: Internal Medicine

## 2018-05-20 DIAGNOSIS — I4891 Unspecified atrial fibrillation: Secondary | ICD-10-CM | POA: Diagnosis not present

## 2018-05-20 DIAGNOSIS — Z5181 Encounter for therapeutic drug level monitoring: Secondary | ICD-10-CM | POA: Diagnosis not present

## 2018-05-20 DIAGNOSIS — G4733 Obstructive sleep apnea (adult) (pediatric): Secondary | ICD-10-CM

## 2018-05-20 LAB — POCT INR: INR: 2.9 (ref 2.0–3.0)

## 2018-05-20 NOTE — Telephone Encounter (Signed)
Spoke with patient, patient reports he told us the wrong DME company that he got his Cpap from. He is with [employer]. Order was placed with [facility]. Called [facility] and cancelled order and new order sent to [facility]. Patient voiced understanding. Nothing further needed at this time.

## 2018-05-20 NOTE — Patient Instructions (Addendum)
Description   Continue taking 1 tablet daily except 1.5 tablets on Sundays, Tuesdays and Thursdays.  Recheck INR in 1 week after colonoscopy. Call us with any medication changes or procedure to coumadin Clinic @ [phone]    05/26/18- Last dose of Coumadin.  05/27/18- No Coumadin or Lovenox.  05/28/18- Inject Lovenox 100mg  in the fatty abdominal tissue at least 2 inches from the belly button twice a day about 12 hours apart, 8am and 8pm rotate sites. No Coumadin.  05/29/18-Inject Lovenox in the fatty tissue every 12 hours, 8am and 8pm. No Coumadin.  05/30/18- Inject Lovenox in the fatty tissue every 12 hours, 8am and 8pm. No Coumadin.  05/31/18- Inject Lovenox in the fatty tissue in the morning at 8 am (No PM dose). No Coumadin.  06/01/18- Procedure Day - No Lovenox - Resume Coumadin in the evening or as directed by doctor (take an extra half tablet with usual dose for 2 days then resume normal dose).  06/02/18- Resume Lovenox inject in the fatty tissue every 12 hours and take Coumadin.  06/03/18-Inject Lovenox in the fatty tissue every 12 hours and take Coumadin.  06/04/18- Inject Lovenox in the fatty tissue every 12 hours and take Coumadin.  06/05/18- Inject Lovenox in the fatty tissue every 12 hours and take Coumadin.  06/06/18- Inject Lovenox in the fatty tissue every 12 hours and take Coumadin.  06/07/18- Inject Lovenox in the fatty tissue and Coumadin appt to check INR.

## 2018-05-20 NOTE — Progress Notes (Signed)
amb ref °

## 2018-05-21 NOTE — Telephone Encounter (Signed)
Description   Continue taking 1 tablet daily except 1.5 tablets on Sundays, Tuesdays and Thursdays.  Recheck INR in 1 week after colonoscopy. Call us with any medication changes or procedure to coumadin Clinic @ [phone]    05/26/18- Last dose of Coumadin.  05/27/18- No Coumadin or Lovenox. Per 05/20/18 anti-coag visit encounter with Derrel Nip, RN (cardiology), patient has been instructed with the following information for his Coumadin/Lovenox bridging prior to his colonoscopy scheduled for 06/01/18:  05/28/18- Inject Lovenox 100mg  in the fatty abdominal tissue at least 2 inches from the belly button twice a day about 12 hours apart, 8am and 8pm rotate sites. No Coumadin.  05/29/18-Inject Lovenox in the fatty tissue every 12 hours, 8am and 8pm. No Coumadin.  05/30/18- Inject Lovenox in the fatty tissue every 12 hours, 8am and 8pm. No Coumadin.  05/31/18- Inject Lovenox in the fatty tissue in the morning at 8 am (No PM dose). No Coumadin.  06/01/18- Procedure Day - No Lovenox - Resume Coumadin in the evening or as directed by doctor (take an extra half tablet with usual dose for 2 days then resume normal dose).  06/02/18- Resume Lovenox inject in the fatty tissue every 12 hours and take Coumadin.  06/03/18-Inject Lovenox in the fatty tissue every 12 hours and take Coumadin.  06/04/18- Inject Lovenox in the fatty tissue every 12 hours and take Coumadin.  06/05/18- Inject Lovenox in the fatty tissue every 12 hours and take Coumadin.  06/06/18- Inject Lovenox in the fatty tissue every 12 hours and take Coumadin.  06/07/18- Inject Lovenox in the fatty tissue and Coumadin appt to check INR.   I have spoken to Lance Cook to confirm that he has received these instructions and he states that he has received them and has his lovenox for his upcoming procedure.

## 2018-06-01 ENCOUNTER — Other Ambulatory Visit: Payer: Self-pay

## 2018-06-01 ENCOUNTER — Ambulatory Visit (HOSPITAL_COMMUNITY)
Admission: RE | Admit: 2018-06-01 | Discharge: 2018-06-01 | Disposition: A | Discharge status: Home / Self Care | Payer: Medicare HMO | Source: Ambulatory Visit | Attending: Internal Medicine | Admitting: Internal Medicine

## 2018-06-01 ENCOUNTER — Ambulatory Visit (HOSPITAL_COMMUNITY): Payer: Medicare HMO | Admitting: Anesthesiology

## 2018-06-01 ENCOUNTER — Encounter (HOSPITAL_COMMUNITY): Payer: Self-pay | Admitting: *Deleted

## 2018-06-01 ENCOUNTER — Encounter (HOSPITAL_COMMUNITY)
Admission: RE | Disposition: A | Discharge status: Home / Self Care | Payer: Self-pay | Source: Ambulatory Visit | Attending: Internal Medicine

## 2018-06-01 DIAGNOSIS — E1151 Type 2 diabetes mellitus with diabetic peripheral angiopathy without gangrene: Secondary | ICD-10-CM | POA: Insufficient documentation

## 2018-06-01 DIAGNOSIS — K644 Residual hemorrhoidal skin tags: Secondary | ICD-10-CM | POA: Diagnosis not present

## 2018-06-01 DIAGNOSIS — G8929 Other chronic pain: Secondary | ICD-10-CM | POA: Diagnosis not present

## 2018-06-01 DIAGNOSIS — K621 Rectal polyp: Secondary | ICD-10-CM | POA: Diagnosis not present

## 2018-06-01 DIAGNOSIS — I2510 Atherosclerotic heart disease of native coronary artery without angina pectoris: Secondary | ICD-10-CM | POA: Insufficient documentation

## 2018-06-01 DIAGNOSIS — Z86010 Personal history of colonic polyps: Secondary | ICD-10-CM | POA: Diagnosis not present

## 2018-06-01 DIAGNOSIS — F419 Anxiety disorder, unspecified: Secondary | ICD-10-CM | POA: Diagnosis not present

## 2018-06-01 DIAGNOSIS — D125 Benign neoplasm of sigmoid colon: Secondary | ICD-10-CM | POA: Diagnosis not present

## 2018-06-01 DIAGNOSIS — M545 Low back pain: Secondary | ICD-10-CM | POA: Insufficient documentation

## 2018-06-01 DIAGNOSIS — E785 Hyperlipidemia, unspecified: Secondary | ICD-10-CM | POA: Insufficient documentation

## 2018-06-01 DIAGNOSIS — M109 Gout, unspecified: Secondary | ICD-10-CM | POA: Diagnosis not present

## 2018-06-01 DIAGNOSIS — Z880 Allergy status to penicillin: Secondary | ICD-10-CM | POA: Insufficient documentation

## 2018-06-01 DIAGNOSIS — I5032 Chronic diastolic (congestive) heart failure: Secondary | ICD-10-CM | POA: Diagnosis not present

## 2018-06-01 DIAGNOSIS — I482 Chronic atrial fibrillation, unspecified: Secondary | ICD-10-CM | POA: Insufficient documentation

## 2018-06-01 DIAGNOSIS — Z48815 Encounter for surgical aftercare following surgery on the digestive system: Secondary | ICD-10-CM | POA: Diagnosis present

## 2018-06-01 DIAGNOSIS — Z8673 Personal history of transient ischemic attack (TIA), and cerebral infarction without residual deficits: Secondary | ICD-10-CM | POA: Insufficient documentation

## 2018-06-01 DIAGNOSIS — D122 Benign neoplasm of ascending colon: Secondary | ICD-10-CM | POA: Diagnosis not present

## 2018-06-01 DIAGNOSIS — I252 Old myocardial infarction: Secondary | ICD-10-CM | POA: Insufficient documentation

## 2018-06-01 DIAGNOSIS — K5730 Diverticulosis of large intestine without perforation or abscess without bleeding: Secondary | ICD-10-CM | POA: Diagnosis not present

## 2018-06-01 DIAGNOSIS — I110 Hypertensive heart disease with heart failure: Secondary | ICD-10-CM | POA: Insufficient documentation

## 2018-06-01 DIAGNOSIS — M1990 Unspecified osteoarthritis, unspecified site: Secondary | ICD-10-CM | POA: Diagnosis not present

## 2018-06-01 DIAGNOSIS — Z952 Presence of prosthetic heart valve: Secondary | ICD-10-CM | POA: Insufficient documentation

## 2018-06-01 DIAGNOSIS — D127 Benign neoplasm of rectosigmoid junction: Secondary | ICD-10-CM | POA: Insufficient documentation

## 2018-06-01 DIAGNOSIS — I872 Venous insufficiency (chronic) (peripheral): Secondary | ICD-10-CM | POA: Diagnosis not present

## 2018-06-01 DIAGNOSIS — G4733 Obstructive sleep apnea (adult) (pediatric): Secondary | ICD-10-CM | POA: Insufficient documentation

## 2018-06-01 DIAGNOSIS — F17210 Nicotine dependence, cigarettes, uncomplicated: Secondary | ICD-10-CM | POA: Insufficient documentation

## 2018-06-01 DIAGNOSIS — Z7901 Long term (current) use of anticoagulants: Secondary | ICD-10-CM | POA: Insufficient documentation

## 2018-06-01 DIAGNOSIS — D128 Benign neoplasm of rectum: Secondary | ICD-10-CM | POA: Diagnosis not present

## 2018-06-01 DIAGNOSIS — Z6839 Body mass index (BMI) 39.0-39.9, adult: Secondary | ICD-10-CM | POA: Insufficient documentation

## 2018-06-01 DIAGNOSIS — Z882 Allergy status to sulfonamides status: Secondary | ICD-10-CM | POA: Insufficient documentation

## 2018-06-01 HISTORY — PX: POLYPECTOMY: SHX5525

## 2018-06-01 HISTORY — PX: COLONOSCOPY WITH PROPOFOL: SHX5780

## 2018-06-01 HISTORY — PX: BIOPSY: SHX5522

## 2018-06-01 SURGERY — COLONOSCOPY WITH PROPOFOL
Anesthesia: Monitor Anesthesia Care

## 2018-06-01 MED ORDER — PROPOFOL 500 MG/50ML IV EMUL
INTRAVENOUS | Status: DC | PRN
  Administered 2018-06-01: 125 ug/kg/min via INTRAVENOUS

## 2018-06-01 MED ORDER — PROPOFOL 10 MG/ML IV BOLUS
INTRAVENOUS | Status: DC | PRN
  Administered 2018-06-01: 20 mg via INTRAVENOUS
  Administered 2018-06-01: 10 mg via INTRAVENOUS
  Administered 2018-06-01: 20 mg via INTRAVENOUS

## 2018-06-01 MED ORDER — PROPOFOL 10 MG/ML IV BOLUS
INTRAVENOUS | Status: AC
  Filled 2018-06-01: qty 40

## 2018-06-01 MED ORDER — PROPOFOL 10 MG/ML IV BOLUS
INTRAVENOUS | Status: AC
  Filled 2018-06-01: qty 20

## 2018-06-01 MED ORDER — LACTATED RINGERS IV SOLN
INTRAVENOUS | Status: DC
  Administered 2018-06-01: 1000 mL via INTRAVENOUS

## 2018-06-01 MED ORDER — SODIUM CHLORIDE 0.9 % IV SOLN: INTRAVENOUS | Status: DC

## 2018-06-01 SURGICAL SUPPLY — 21 items

## 2018-06-01 NOTE — Op Note (Signed)
[facility] Patient Name: Lance Cook Procedure Date: 06/01/2018 MRN: [id number] Attending MD: Jerene Bears , MD Date of Birth: 1961/07/16 CSN: [id number] Age: 58 Admit Type: Outpatient Procedure:                Colonoscopy Indications:              Surveillance: History of piecemeal removal large                            adenoma with HGD on last colonoscopy (< 3 yrs), and                            tubulovillous adenoma, Last colonoscopy: October                            2017 Providers:                Lajuan Lines. Hilarie Fredrickson, MD, [facility]aird [facility], RN, Tinnie Gens,                            Technician, [facility], CRNA Referring MD:             Domingo Pulse. Frederico Hamman, PA-C Medicines:                Monitored Anesthesia Care Complications:            No immediate complications. Estimated Blood Loss:     Estimated blood loss was minimal. Procedure:                Pre-Anesthesia Assessment:                           - Prior to the procedure, a History and Physical                            was performed, and patient medications and                            allergies were reviewed. The patient's tolerance of                            previous anesthesia was also reviewed. The risks                            and benefits of the procedure and the sedation                            options and risks were discussed with the patient.                            All questions were answered, and informed consent                            was obtained. Prior Anticoagulants: The patient has  taken Coumadin (warfarin), last dose was 5 days                            prior to procedure. ASA Grade Assessment: III - A                            patient with severe systemic disease. After                            reviewing the risks and benefits, the patient was                            deemed in satisfactory condition to undergo the     procedure.                           After obtaining informed consent, the colonoscope                            was passed under direct vision. Throughout the                            procedure, the patient's blood pressure, pulse, and                            oxygen saturations were monitored continuously. The                            CF-HQ190L ([id number]) Olympus adult colonoscope was                            introduced through the anus and advanced to the                            cecum, identified by appendiceal orifice and                            ileocecal valve. The colonoscopy was performed                            without difficulty. The patient tolerated the                            procedure well. The quality of the bowel                            preparation was good. The ileocecal valve,                            appendiceal orifice, and rectum were photographed. Scope In: 10:11:08 AM Scope Out: 10:38:59 AM Scope Withdrawal Time: 0 hours 23 minutes 10 seconds  Total Procedure Duration: 0 hours 27 minutes 51 seconds  Findings:      Skin tags were found on perianal exam.      A 2 mm  polyp was found in the ascending colon. The polyp was sessile.       The polyp was removed with a cold biopsy forceps. Resection and       retrieval were complete.      A 5 mm polyp was found in the ascending colon. The polyp was sessile.       The polyp was removed with a cold snare. Resection and retrieval were       complete.      A 4 mm polyp was found in the sigmoid colon. The polyp was sessile. The       polyp was removed with a cold snare. Resection and retrieval were       complete.      A tattoo was seen in the transverse colon. A post-polypectomy scar was       found at the tattoo site. There was no evidence of residual polyp tissue.      A 6 mm polyp was found in the recto-sigmoid colon. The polyp was       sessile. The polyp was removed with a cold snare. Resection  and       retrieval were complete.      A 3 mm polyp was found in the rectum. The polyp was sessile. The polyp       was removed with a cold biopsy forceps. Resection and retrieval were       complete.      Multiple small and large-mouthed diverticula were found in the       transverse colon and ascending colon.      A 6 mm polyp was found in the rectum (best seen on retroflexion and       approaching the dentate line). The polyp was semi-pedunculated. The       polyp was removed with a hot snare. Resection and retrieval were       complete. Impression:               - One 2 mm polyp in the ascending colon, removed                            with a cold biopsy forceps. Resected and retrieved.                           - One 5 mm polyp in the ascending colon, removed                            with a cold snare. Resected and retrieved.                           - One 4 mm polyp in the sigmoid colon, removed with                            a cold snare. Resected and retrieved.                           - A tattoo was seen in the transverse colon. A                            post-polypectomy scar was found  at the tattoo site.                            There was no evidence of residual polyp tissue.                           - One 6 mm polyp at the recto-sigmoid colon,                            removed with a cold snare. Resected and retrieved.                           - One 3 mm polyp in the rectum, removed with a cold                            biopsy forceps. Resected and retrieved.                           - Diverticulosis in the transverse colon and in the                            ascending colon.                           - One 6 mm polyp in the rectum, removed with a hot                            snare. Resected and retrieved. Moderate Sedation:      N/A Recommendation:           - Patient has a contact number available for                            emergencies. The signs  and symptoms of potential                            delayed complications were discussed with the                            patient. Return to normal activities tomorrow.                            Written discharge instructions were provided to the                            patient.                           - Resume previous diet.                           - Continue present medications.                           - Resume Coumadin (warfarin) tomorrow and Lovenox                            (  enoxaparin) tomorrow at prior doses. Refer to                            managing physician for further adjustment of                            therapy.                           - Await pathology results.                           - Repeat colonoscopy date to be determined after                            pending pathology results are reviewed for                            surveillance. Procedure Code(s):        --- Professional ---                           [phone], Colonoscopy, flexible; with removal of                            tumor(s), polyp(s), or other lesion(s) by snare                            technique                           45380, 32, Colonoscopy, flexible; with biopsy,                            single or multiple Diagnosis Code(s):        --- Professional ---                           Z86.010, Personal history of colonic polyps                           D12.2, Benign neoplasm of ascending colon                           D12.5, Benign neoplasm of sigmoid colon                           D12.7, Benign neoplasm of rectosigmoid junction                           K62.1, Rectal polyp                           K64.4, Residual hemorrhoidal skin tags                           K57.30, Diverticulosis of large intestine without  perforation or abscess without bleeding CPT copyright 2017 American Medical Association. All rights reserved. The codes documented in  this report are preliminary and upon coder review may  be revised to meet current compliance requirements. Jerene Bears, MD 06/01/2018 10:57:17 AM This report has been signed electronically. Number of Addenda: 0

## 2018-06-01 NOTE — H&P (Signed)
HPI: Lance Cook is a 57 year old male with a history of multiple high risk adenomatous colon polyps, history of C. difficile colitis, valvular heart disease with history of AVR status post Saint Jude mechanical valve in 2016, CAD, chronic A. fib, chronic diastolic heart failure, prior stroke, chronic venous insufficiency, sleep apnea on chronic anticoagulation with warfarin who presents for outpatient colonoscopy for polyp surveillance.  He was seen in the office on 03/29/2018 to have this procedure arranged.  No new complaints today.  No current chest pain or shortness of breath.  No recent issues with bleeding  He tolerated the bowel prep.  He has been off of warfarin since 05/26/2018 and was on a Lovenox bridge with his last dose being in the evening on 05/30/2018.  Past Medical History:  Diagnosis Date  . Acute on chronic diastolic CHF (congestive heart failure) ([place]) 08/27/2016  . Anemia   . Anxiety   . ARDS (adult respiratory distress syndrome) ([name]) 01/27/2015  . Arthritis   . Ascending aortic dissection ([place]) 07/14/2008   Localized dissection of ascending aorta noted on CTA in 2009 and stable on CTA in 2011  . Asymptomatic chronic venous hypertension 01/15/2010   Overview:  Overview:  Qualifier: Diagnosis of  By: [name] MD, [name]   Last Assessment & Plan:  I suggested he go to a vein clinic to see if he could be qualified compression hose of some type, since he is not a surgical candidate according to the vascular surgeons.   . Atrial fibrillation ([place])    chronic persistent  . Bell's palsy   . C. difficile diarrhea   . CAD (coronary artery disease)    Old scar inferior wall myoview, 10/2009 EF 52%.  He did have previous SVG to RCA but no obstructive disease noted on his most recent cath.  SVG occluded.   . Cellulitis 04/02/2014  . Cerebral artery occlusion with cerebral infarction ([place]) 10/08/2011   Overview:  Overview:  And hx of TIA prior to CABG, all thought due to systemic  emboli prior to coumadin   . Chronic diastolic heart failure ([place]) 03/01/2015   a. 12/2015: echo showing a preserved EF of 65-70%, moderate AS, and moderate TR.   Marland Kitchen Chronic LBP 10/08/2011  . CVA (cerebral vascular accident) ([place]) 10/08/2011   And hx of TIA prior to CABG, all thought due to systemic emboli prior to coumadin   . DIABETES MELLITUS, TYPE II 11/01/2009   Qualifier: Diagnosis of  By: [name] MD, [name]    . Diverticulosis   . 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 Encephalopathy acute 02/05/2015  . Gastric polyps   . GERD (gastroesophageal reflux disease)    not needing medication at thhis time- 01/22/15  . Gout   . H/O mechanical aortic valve replacement 11/01/2009   Qualifier: Diagnosis of  By: [name], [name]    . Hiatal hernia   . History of colon polyps 12/23/2011   Overview:  Overview:  Colonoscopy January 2010, 6 mm rectal tubulovillous adenoma. No high-grade dysplasia   . HTN (hypertension) 02/23/2014  . Hyperlipidemia   . Hypertension   . Hypogonadism male 04/08/2012  . Impaired glucose tolerance 10/08/2011  . Morbid obesity ([place]) 04/02/2014  . Myocardial infarction [facility]) age 68  . OSA (obstructive sleep apnea)    CPAP  . Peripheral vascular disease ([place])   . 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 7/34/1937   33 mm St Jude bileaflet mechanical valve placed via right mini thoracotomy approach  . 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    1988 - Dr Blase Mess at [facility] in [place], [place]  . Severe mitral regurgitation 10/11/2014  . Testicular cancer [facility])    He was 57 y/o. He had surgical resection and rad tx's.   Marland Kitchen TIA (transient ischemic attack)    age 55  . Tubulovillous adenoma of colon   . Varicose veins   . 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 Venous (peripheral) insufficiency 02/03/2005    Past Surgical History:  Procedure Laterality Date  . aortic truck    . BENTALL PROCEDURE  1988   25 mm St Jude mechanical valve conduit - Dr Blase Mess at [facility] in [place], [place]  . CHOLECYSTECTOMY  2011  . COLONOSCOPY WITH PROPOFOL N/A 06/17/2016   Procedure: COLONOSCOPY WITH PROPOFOL;  Surgeon: Jerene Bears, MD;  Location: [facility] ENDOSCOPY;  Service: Gastroenterology;  Laterality: N/A;  . CORONARY ARTERY BYPASS GRAFT    . LASER ABLATION Left 03/06/2010   leg  . MITRAL VALVE REPLACEMENT Right 01/24/2015   Procedure: Re-Operation, MINIMALLY INVASIVE MITRAL VALVE (MV) REPLACEMENT.;  Surgeon: Rexene Alberts, MD;  Location: [place];  Service: Open Heart Surgery;  Laterality: Right;  . ORCHIECTOMY  age 46   testicular cancer  . OTOPLASTY     bilateral, age 37  . TEE WITHOUT CARDIOVERSION N/A 10/11/2014   Procedure: TRANSESOPHAGEAL ECHOCARDIOGRAM (TEE);  Surgeon: Pixie Casino, MD;  Location: [facility] ENDOSCOPY;  Service: Cardiovascular;  Laterality: N/A;  . TEE WITHOUT CARDIOVERSION N/A 01/24/2015   Procedure: TRANSESOPHAGEAL ECHOCARDIOGRAM (TEE);  Surgeon: Rexene Alberts, MD;  Location: [place];  Service: Open Heart Surgery;  Laterality: N/A;  . TONSILLECTOMY  1967  . TOOTH EXTRACTION  2016     (Not in an outpatient encounter)  Allergies  Allergen Reactions  . Penicillins Hives, Rash and Other (See Comments)    Has patient had a PCN reaction causing immediate rash, facial/tongue/throat swelling, SOB or lightheadedness with hypotension: Yes Has patient had a PCN reaction causing severe rash involving mucus membranes or skin necrosis: Yes Has patient had a PCN reaction that required hospitalization unsure Has patient had a PCN reaction occurring within the last 10 years: yes If all of the above answers are "NO", then may proceed with Cephalosporin use.  . Adhesive  [Tape] Other (See Comments)    Shin irritation  . Bactrim [Sulfamethoxazole-Trimethoprim] Other (See Comments)    CDIF, when taking oral tablets    Family History  Problem Relation Age of Onset  . Heart disease Mother   . Throat cancer Mother   . Other Mother        bowel obstruction  . Diabetes Father   . Stroke Other   . Hypertension Other   . Cancer Maternal Aunt        lung, brain  . Cancer Maternal Uncle        brain aneurysm  . Colon cancer Neg Hx     Social History   Tobacco Use  . Smoking status: Light Tobacco Smoker    Types: Cigars  . Smokeless tobacco: Never Used  . Tobacco comment: about 3 yearly- cigar  Substance Use Topics  . Alcohol use: Yes  Alcohol/week: 0.0 standard drinks    Comment: social  . Drug use: No    ROS: As per history of present illness, otherwise negative  BP (!) 146/84   Pulse 70   Temp 98 F (36.7 C) (Oral)   Resp 19   Ht 5' (1.524 m)   Wt 91.2 kg   SpO2 100%   BMI 39.26 kg/m  Gen: awake, alert, NAD HEENT: anicteric, op clear CV: mechanical s1 Pulm: CTA b/l Abd: soft, NT/ND, +BS throughout Ext: no c/c, b/l venous stasis Neuro: nonfocal   RELEVANT LABS AND IMAGING: CBC    Component Value Date/Time   WBC 14.5 (H) 01/14/2018 1943   RBC 5.23 01/14/2018 1943   HGB 11.3 (L) 01/14/2018 1943   HGB 15.1 03/02/2013 1132   HGB 13.2 02/03/2012 1144   HCT 38.9 (L) 01/14/2018 1943   HCT 44.5 03/02/2013 1132   HCT 41.5 02/03/2012 1144   PLT 241 01/14/2018 1943   PLT 126 (L) 03/02/2013 1132   PLT 122 (L) 02/03/2012 1144   MCV 74.4 (L) 01/14/2018 1943   MCV 87 03/02/2013 1132   MCV 79.7 02/03/2012 1144   MCH 21.6 (L) 01/14/2018 1943   MCHC 29.0 (L) 01/14/2018 1943   RDW 19.9 (H) 01/14/2018 1943   RDW 17.0 (H) 03/02/2013 1132   RDW 23.4 (H) 02/03/2012 1144   LYMPHSABS 0.4 (L) 01/14/2018 1943   LYMPHSABS 1.7 09/22/2012 0413   LYMPHSABS 1.0 02/03/2012 1144   MONOABS 0.6 01/14/2018 1943   MONOABS 0.9 09/22/2012 0413    MONOABS 0.5 02/03/2012 1144   EOSABS 0.2 01/14/2018 1943   EOSABS 0.2 09/22/2012 0413   BASOSABS 0.1 01/14/2018 1943   BASOSABS 0.1 09/22/2012 0413   BASOSABS 0.1 02/03/2012 1144    CMP     Component Value Date/Time   NA 139 05/12/2018 1259   NA 138 12/09/2013 1606   K 3.9 05/12/2018 1259   K 4.3 12/09/2013 1606   CL 92 (L) 05/12/2018 1259   CL 104 12/09/2013 1606   CO2 32 (H) 05/12/2018 1259   CO2 30 12/09/2013 1606   GLUCOSE 102 (H) 05/12/2018 1259   GLUCOSE 91 01/14/2018 1943   GLUCOSE 85 12/09/2013 1606   BUN 24 05/12/2018 1259   BUN 22 (H) 12/09/2013 1606   CREATININE 0.87 05/12/2018 1259   CREATININE 0.94 01/16/2017 0903   CALCIUM 8.4 (L) 05/12/2018 1259   CALCIUM 9.2 12/09/2013 1606   PROT 5.0 (L) 01/14/2018 1943   PROT 7.1 12/09/2013 1606   ALBUMIN 2.0 (L) 01/14/2018 1943   ALBUMIN 3.7 12/09/2013 1606   AST 20 01/14/2018 1943   AST 29 12/09/2013 1606   ALT 16 (L) 01/14/2018 1943   ALT 26 12/09/2013 1606   ALKPHOS 60 01/14/2018 1943   ALKPHOS 56 12/09/2013 1606   BILITOT 0.3 01/14/2018 1943   BILITOT 0.9 12/09/2013 1606   GFRNONAA 96 05/12/2018 1259   GFRNONAA >60 12/09/2013 1606   GFRAA 112 05/12/2018 1259   GFRAA >60 12/09/2013 1606    ASSESSMENT/PLAN:  57 year old male with a history of multiple high risk adenomatous colon polyps, history of C. difficile colitis, valvular heart disease with history of AVR status post Saint Jude mechanical valve in 2016, CAD, chronic A. fib, chronic diastolic heart failure, prior stroke, chronic venous insufficiency, sleep apnea on chronic anticoagulation with warfarin who presents for outpatient colonoscopy for polyp surveillance.  1.  History of multiple adenomatous colon polyps/chronic anticoagulation --for surveillance colonoscopy today with monitored anesthesia care.  He has been off of his warfarin appropriately and has done a Lovenox bridge which also stopped appropriately 36 hours ago.  Discussed the risk, benefits  and alternatives to this procedure and he is agreeable and wishes to proceed.

## 2018-06-01 NOTE — Anesthesia Preprocedure Evaluation (Addendum)
Anesthesia Evaluation  Patient identified by MRN, date of birth, ID band Patient awake    Reviewed: Allergy & Precautions, NPO status , Patient's Chart, lab work & pertinent test results, reviewed documented beta blocker date and time   History of Anesthesia Complications Negative for: history of anesthetic complications  Airway Mallampati: II  TM Distance: >3 FB Neck ROM: Full    Dental  (+) Dental Advisory Given   Pulmonary sleep apnea and Continuous Positive Airway Pressure Ventilation , Current Smoker,    Pulmonary exam normal        Cardiovascular hypertension, Pt. on medications and Pt. on home beta blockers + Peripheral Vascular Disease (s/p Bentall)  + dysrhythmias Atrial Fibrillation + Valvular Problems/Murmurs (s/p AVR, s/p MVR)  Rhythm:Regular Rate:Normal  7/10 ECHO: Systolic function was normal, EF 55-60%. - Aortic valve: Mechanical St Jude AVR with stable gradients since 2017, no significant peri valvular regurgitation. - Mitral valve: Normal appearing mechanical St Jude MVR -mod TR   Neuro/Psych Anxiety TIACVA, No Residual Symptoms    GI/Hepatic negative GI ROS, Neg liver ROS, neg GERD  ,  Endo/Other  negative endocrine ROSneg diabetesMorbid obesity  Renal/GU negative Renal ROS     Musculoskeletal   Abdominal (+) + obese,   Peds  Hematology Coumadin: last dose 2 days ago   Anesthesia Other Findings   Reproductive/Obstetrics                            Anesthesia Physical Anesthesia Plan  ASA: III  Anesthesia Plan: MAC   Post-op Pain Management:    Induction:   PONV Risk Score and Plan: 0  Airway Management Planned: Natural Airway and Nasal Cannula  Additional Equipment:   Intra-op Plan:   Post-operative Plan:   Informed Consent: I have reviewed the patients History and Physical, chart, labs and discussed the procedure including the risks, benefits and  alternatives for the proposed anesthesia with the patient or authorized representative who has indicated his/her understanding and acceptance.   Dental advisory given  Plan Discussed with: CRNA and Surgeon  Anesthesia Plan Comments: (Plan routine monitors, MAC)        Anesthesia Quick Evaluation

## 2018-06-01 NOTE — Discharge Instructions (Signed)

## 2018-06-01 NOTE — Transfer of Care (Signed)
Immediate Anesthesia Transfer of Care Note  Patient: Lance Cook  Procedure(s) Performed: COLONOSCOPY WITH PROPOFOL (N/A ) POLYPECTOMY  Patient Location: PACU  Anesthesia Type:MAC  Level of Consciousness: awake, alert  and oriented  Airway & Oxygen Therapy: Patient Spontanous Breathing and Patient connected to face mask oxygen  Post-op Assessment: Report given to RN and Post -op Vital signs reviewed and stable  Post vital signs: Reviewed and stable  Last Vitals:  Vitals Value Taken Time  BP 103/63 06/01/2018 10:53 AM  Temp    Pulse 60 06/01/2018 10:54 AM  Resp 23 06/01/2018 10:54 AM  SpO2 100 % 06/01/2018 10:54 AM  Vitals shown include unvalidated device data.  Last Pain:  Vitals:   06/01/18 1053  TempSrc:   PainSc: (P) 0-No pain         Complications: No apparent anesthesia complications

## 2018-06-01 NOTE — Anesthesia Postprocedure Evaluation (Signed)
Anesthesia Post Note  Patient: Lance Cook  Procedure(s) Performed: COLONOSCOPY WITH PROPOFOL (N/A ) POLYPECTOMY     Patient location during evaluation: Endoscopy Anesthesia Type: MAC Level of consciousness: awake and alert, oriented and patient cooperative Pain management: pain level controlled Vital Signs Assessment: post-procedure vital signs reviewed and stable Respiratory status: spontaneous breathing, nonlabored ventilation and respiratory function stable Cardiovascular status: blood pressure returned to baseline and stable Postop Assessment: adequate PO intake and no apparent nausea or vomiting Anesthetic complications: no    Last Vitals:  Vitals:   06/01/18 1100 06/01/18 1110  BP: 107/63 119/75  Pulse: 69 60  Resp: (!) 22 18  Temp:    SpO2: 100% 100%    Last Pain:  Vitals:   06/01/18 1110  TempSrc:   PainSc: 0-No pain                 Lakeishia Truluck,E. Emma Birchler

## 2018-06-03 ENCOUNTER — Encounter (HOSPITAL_COMMUNITY): Payer: Self-pay | Admitting: Internal Medicine

## 2018-06-03 MED FILL — DIGOXIN 250 MCG TABLET: 250 | 30 days supply | Qty: 30 | Fill #3

## 2018-06-04 ENCOUNTER — Telehealth: Payer: Self-pay | Admitting: Internal Medicine

## 2018-06-04 NOTE — Telephone Encounter (Signed)
Pt had colon done 06/01/18 and states Dr. Hilarie Fredrickson had told him he may see some blood following the procedure. States this morning he passed some black stool and he wanted to let Dr. Hilarie Fredrickson know. Pt knows to monitor this and let us know if it continues.

## 2018-06-07 ENCOUNTER — Encounter: Payer: Self-pay | Admitting: Internal Medicine

## 2018-06-07 ENCOUNTER — Ambulatory Visit (INDEPENDENT_AMBULATORY_CARE_PROVIDER_SITE_OTHER): Payer: Medicare HMO | Admitting: Pharmacist

## 2018-06-07 DIAGNOSIS — Z5181 Encounter for therapeutic drug level monitoring: Secondary | ICD-10-CM

## 2018-06-07 DIAGNOSIS — I4891 Unspecified atrial fibrillation: Secondary | ICD-10-CM | POA: Diagnosis not present

## 2018-06-07 LAB — POCT INR: INR: 1.8 — AB (ref 2.0–3.0)

## 2018-06-07 NOTE — Patient Instructions (Signed)
Description   Take an extra half tablet today and tomorrow. Then continue taking usual dose of 5 mg daily except 7.5 every Sunday, Tuesday and Thursday. Continue lovenox 100 mg [place] twice daily. Recheck INR on Friday.

## 2018-06-11 ENCOUNTER — Ambulatory Visit (INDEPENDENT_AMBULATORY_CARE_PROVIDER_SITE_OTHER): Payer: Medicare HMO | Admitting: *Deleted

## 2018-06-11 DIAGNOSIS — I4891 Unspecified atrial fibrillation: Secondary | ICD-10-CM | POA: Diagnosis not present

## 2018-06-11 DIAGNOSIS — Z5181 Encounter for therapeutic drug level monitoring: Secondary | ICD-10-CM | POA: Diagnosis not present

## 2018-06-11 LAB — POCT INR: INR: 2.2 (ref 2.0–3.0)

## 2018-06-11 NOTE — Patient Instructions (Addendum)
Description   Take 7.5mg  today, then continue taking usual dose of 5 mg daily except 7.5 every Sunday, Tuesday and Thursday. Continue lovenox 100 mg into abdomen fatty tissue, last injection tonight. Recheck INR in 2 weeks.

## 2018-06-17 NOTE — Progress Notes (Signed)
Cardiology Office Note   Date:  06/18/2018   ID:  Lance Cook, DOB 1961-01-19, MRN [id number]  PCP:  [name], PA-C  Cardiologist:   [name], MD    Chief Complaint  Patient presents with  . MVR      History of Present Illness: Lance Cook is a 57 y.o. male who presents for follow up of valvular heart disease.   He has a past medical history of AVR (s/p Bentall procedure in 1988), severe mitral regurgitation (s/p St Jude mechanical valve placed in 12/2014), CAD (s/p SVG-RCA, known to be occluded by cath in 12/2014), chronic atrial fibrillation, chronic diastolic CHF (EF 01-74% by echo in 12/2015), prior CVA, chronic venous insufficiency, OSA (on CPAP)and recurrent C. difficile infection.   Since I last saw him he has been seen 3 times in the office for volume management.  I reviewed these notes from our APPs.  He required metolazone and has had good diuresis.  He returns for follow up.   He is doing much better than when I last saw him.  He is not having any acute shortness of breath.  His weights have been very stable as below.  He has much less edema.  He denies any chest pressure, neck or arm discomfort.  He has not had any new palpitations, presyncope or syncope.  He still has some lower extremity venous stasis with lymphedema as well and has some chronic nonhealing ulceration on his legs but he does also scratch at these.  Past Medical History:  Diagnosis Date  . Acute on chronic diastolic CHF (congestive heart failure) ([place]) 08/27/2016  . Anemia   . Anxiety   . ARDS (adult respiratory distress syndrome) ([place]) 01/27/2015  . Arthritis   . Ascending aortic dissection ([place]) 07/14/2008   Localized dissection of ascending aorta noted on CTA in 2009 and stable on CTA in 2011  . Asymptomatic chronic venous hypertension 01/15/2010   Overview:  Overview:  Qualifier: Diagnosis of  By: [name] MD, [name]   Last Assessment & Plan:  I suggested he go to a vein clinic to see  if he could be qualified compression hose of some type, since he is not a surgical candidate according to the vascular surgeons.   . Atrial fibrillation ([place])    chronic persistent  . Bell's palsy   . C. difficile diarrhea   . CAD (coronary artery disease)    Old scar inferior wall myoview, 10/2009 EF 52%.  He did have previous SVG to RCA but no obstructive disease noted on his most recent cath.  SVG occluded.   . Cellulitis 04/02/2014  . Cerebral artery occlusion with cerebral infarction ([place]) 10/08/2011   Overview:  Overview:  And hx of TIA prior to CABG, all thought due to systemic emboli prior to coumadin   . Chronic diastolic heart failure ([place]) 03/01/2015   a. 12/2015: echo showing a preserved EF of 65-70%, moderate AS, and moderate TR.   Marland Kitchen Chronic LBP 10/08/2011  . CVA (cerebral vascular accident) ([place]) 10/08/2011   And hx of TIA prior to CABG, all thought due to systemic emboli prior to coumadin   . DIABETES MELLITUS, TYPE II 11/01/2009   Qualifier: Diagnosis of  By: [name] MD, [name]    . Diverticulosis   . 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 Encephalopathy acute  02/05/2015  . Gastric polyps   . GERD (gastroesophageal reflux disease)    not needing medication at thhis time- 01/22/15  . Gout   . H/O mechanical aortic valve replacement 11/01/2009   Qualifier: Diagnosis of  By: [name], [name]    . Hiatal hernia   . History of colon polyps 12/23/2011   Overview:  Overview:  Colonoscopy January 2010, 6 mm rectal tubulovillous adenoma. No high-grade dysplasia   . HTN (hypertension) 02/23/2014  . Hyperlipidemia   . Hypertension   . Hypogonadism male 04/08/2012  . Impaired glucose tolerance 10/08/2011  . Morbid obesity ([place]) 04/02/2014  . Myocardial infarction [facility]) age 7  . OSA (obstructive sleep apnea)    CPAP  . Peripheral vascular disease ([place])   . S/P   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 1/61/0960   33 mm St Jude bileaflet mechanical valve placed via right mini thoracotomy approach  . 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    1988 - Dr Blase Mess at [facility] in [place], [place]  . Severe mitral regurgitation 10/11/2014  . Testicular cancer [facility])    He was 57 y/o. He had surgical resection and rad tx's.   Marland Kitchen TIA (transient ischemic attack)    age 35  . Tubulovillous adenoma of colon   . Varicose veins   . 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 Venous (peripheral) insufficiency 02/03/2005    Past Surgical History:  Procedure Laterality Date  . aortic truck    . BENTALL PROCEDURE  1988   25 mm St Jude mechanical valve conduit - Dr Blase Mess at [facility] in [place], [place]  . BIOPSY  06/01/2018   Procedure: BIOPSY;  Surgeon: Jerene Bears, MD;  Location: [facility] ENDOSCOPY;  Service: Gastroenterology;;  . CARDIAC VALVE REPLACEMENT     [phone]  . CHOLECYSTECTOMY  2011  . COLONOSCOPY WITH PROPOFOL N/A 06/17/2016   Procedure: COLONOSCOPY WITH PROPOFOL;  Surgeon: Jerene Bears, MD;  Location: [facility] ENDOSCOPY;  Service: Gastroenterology;  Laterality: N/A;  . COLONOSCOPY WITH PROPOFOL N/A 06/01/2018   Procedure: COLONOSCOPY WITH PROPOFOL;  Surgeon: Jerene Bears, MD;  Location: [facility] ENDOSCOPY;  Service: Gastroenterology;  Laterality: N/A;  . CORONARY ARTERY BYPASS GRAFT    . LASER ABLATION Left 03/06/2010   leg  . MITRAL VALVE REPLACEMENT Right 01/24/2015   Procedure: Re-Operation, MINIMALLY INVASIVE MITRAL VALVE (MV) REPLACEMENT.;  Surgeon: Rexene Alberts, MD;  Location: [place];  Service: Open Heart Surgery;  Laterality: Right;  . ORCHIECTOMY  age 40   testicular cancer  . OTOPLASTY     bilateral, age 83  . POLYPECTOMY  06/01/2018   Procedure: POLYPECTOMY;  Surgeon: Jerene Bears, MD;  Location: [facility] ENDOSCOPY;  Service: Gastroenterology;;  .  TEE WITHOUT CARDIOVERSION N/A 10/11/2014   Procedure: TRANSESOPHAGEAL ECHOCARDIOGRAM (TEE);  Surgeon: Pixie Casino, MD;  Location[facility] ENDOSCOPY;  Service: Cardiovascular;  Laterality: N/A;  . TEE WITHOUT CARDIOVERSION N/A 01/24/2015   Procedure: TRANSESOPHAGEAL ECHOCARDIOGRAM (TEE);  Surgeon: Rexene Alberts, MD;  Location: [place];  Service: Open Heart Surgery;  Laterality: N/A;  . TONSILLECTOMY  1967  . TOOTH EXTRACTION  2016     Current Outpatient Medications  Medication Sig Dispense Refill  . acetaminophen (TYLENOL) 500 MG tablet Take 500-1,000 mg by mouth daily as needed for moderate pain or headache.     . allopurinol (ZYLOPRIM) 300 MG tablet Take 300 mg by mouth daily.    Marland Kitchen  atenolol (TENORMIN) 100 MG tablet Take 25 mg by mouth 2 (two) times daily.    . digoxin (LANOXIN) 0.25 MG tablet Take 1 tablet (250 mcg total) by mouth daily. 90 tablet 2  . enoxaparin (LOVENOX) 100 MG/ML injection Inject 100 mg into the skin 2 (two) times daily.    . furosemide (LASIX) 80 MG tablet Take 80 mg by mouth daily.    . Homeopathic Products (SIMILASAN PINK EYE RELIEF) SOLN Place 1 drop into both eyes daily as needed (irritation).    . magnesium oxide (MAG-OX) 400 (241.3 MG) MG tablet Take 1 tablet (400 mg total) by mouth 2 (two) times daily. 180 tablet 3  . methocarbamol (ROBAXIN) 500 MG tablet Take 1 tablet (500 mg total) by mouth 3 (three) times daily. 270 tablet 0  . metolazone (ZAROXOLYN) 5 MG tablet Take 1 tablet (5 mg total) by mouth once a week. 14 tablet 3  . mupirocin ointment (BACTROBAN) 2 % Apply 1 application topically 2 (two) times daily as needed (infection prevention).     . NON FORMULARY Apply 2 Pump topically 3 (three) times daily. LIDO5/DICLO3/CYCLO2/GABA 2    . [START ON 06/29/2018] oxyCODONE (OXY IR/ROXICODONE) 5 MG immediate release tablet Take 1 tablet (5 mg total) by mouth every 8 (eight) hours as needed for severe pain. (Patient taking differently: Take 5-10 mg by mouth 3 (three)  times daily as needed for severe pain. Max 15 mg per day) 90 tablet 0  . Potassium 99 MG TABS Take 99-198 mg by mouth daily as needed (cramping).    . potassium chloride (K-DUR) 10 MEQ tablet TAKE 2 TABLETS BY MOUTH TWICE DAILY...(DUE FOR FOLLOW UP APPT) (Patient taking differently: Take 20 mEq by mouth See admin instructions. Take 20 meq by mouth twice daily, take an additional 20 meq dose when taking metolazone) 60 tablet 6  . traZODone (DESYREL) 50 MG tablet Take 100 mg by mouth at bedtime.     Marland Kitchen warfarin (COUMADIN) 5 MG tablet Take 5-7.5 mg by mouth See admin instructions. Take 5 mg by mouth once daily in the evening on Mon, Wed, Fri and Sat.  Take 7.5 mg once daily in the evening on Tues, Thurs, and Sun     No current facility-administered medications for this visit.     Allergies:   Penicillins; Adhesive [tape]; and Bactrim [sulfamethoxazole-trimethoprim]    ROS:  Please see the history of present illness.   Otherwise, review of systems are positive for none.   All other systems are reviewed and negative.    PHYSICAL EXAM: VS:  BP 124/82   Pulse 66   Ht 5\' 1"  (1.549 m)   Wt 202 lb (91.6 kg)   SpO2 96%   BMI 38.17 kg/m  , BMI Body mass index is 38.17 kg/m.  GENERAL:  Well appearing NECK:  No jugular venous distention, waveform within normal limits, carotid upstroke brisk and symmetric, no bruits, no thyromegaly LUNGS:  Clear to auscultation bilaterally CHEST:  Well healed surgical scars.  HEART:  PMI not displaced or sustained,S1 and S2 mechanical, no S3, no S4, no clicks, no rubs, 3 out of 6 holosystolic murmur at the lower left sternal border, no diastolic murmurs murmurs ABD:  Flat, positive bowel sounds normal in frequency in pitch, no bruits, no rebound, no guarding, no midline pulsatile mass, no hepatomegaly, no splenomegaly EXT:  2 plus pulses throughout, moderate edema in the legs, no cyanosis no clubbing   EKG:  EKG  is not ordered  today.    Recent Labs: 01/14/2018:  ALT 16; B Natriuretic Peptide 142.5; Hemoglobin 11.3; Platelets 241 01/28/2018: Magnesium 2.0; TSH 2.890 05/12/2018: BUN 24; Creatinine, Ser 0.87; Potassium 3.9; Sodium 139    Lipid Panel    Component Value Date/Time   CHOL 118 08/18/2012 1137   TRIG 133.0 08/18/2012 1137   HDL 27.20 (L) 08/18/2012 1137   CHOLHDL 4 08/18/2012 1137   VLDL 26.6 08/18/2012 1137   LDLCALC 64 08/18/2012 1137      Wt Readings from Last 3 Encounters:  06/18/18 202 lb (91.6 kg)  06/01/18 201 lb (91.2 kg)  05/06/18 212 lb 3.2 oz (96.3 kg)      Other studies Reviewed: Additional studies/ records that were reviewed today include: None Review of the above records demonstrates:     ASSESSMENT AND PLAN:  MVR:  This has been stable with the last echo in June.  No change in therapy or further imaging at this point.   AVR:   Again this was stable on echo as above.   No change in therapy.     HTN:  The blood pressure is at target.  No change in therapy.   EDEMA:      He will continue the meds as listed. I will check a BMET and Mg today.   ATRIAL FIB:   The patient  tolerates this rhythm and rate control and anticoagulation. We will continue with the meds as listed.   Current medicines are reviewed at length with the patient today.  The patient does not have concerns regarding medicines.  The following changes have been made:  None  Labs/ tests ordered today include:    Orders Placed This Encounter  Procedures  . Basic Metabolic Panel (BMET)  . Magnesium     Disposition:   FU with Almyra Deforest PA in 4 months.  Signed, [name], MD  06/18/2018 11:18 AM    [place]

## 2018-06-18 ENCOUNTER — Ambulatory Visit (INDEPENDENT_AMBULATORY_CARE_PROVIDER_SITE_OTHER): Payer: Medicare HMO | Admitting: Cardiology

## 2018-06-18 ENCOUNTER — Encounter: Payer: Self-pay | Admitting: Cardiology

## 2018-06-18 VITALS — BP 124/82 | HR 66 | Ht 61.0 in | Wt 202.0 lb

## 2018-06-18 DIAGNOSIS — I10 Essential (primary) hypertension: Secondary | ICD-10-CM

## 2018-06-18 DIAGNOSIS — I5033 Acute on chronic diastolic (congestive) heart failure: Secondary | ICD-10-CM

## 2018-06-18 DIAGNOSIS — Z952 Presence of prosthetic heart valve: Secondary | ICD-10-CM

## 2018-06-18 DIAGNOSIS — Z79899 Other long term (current) drug therapy: Secondary | ICD-10-CM | POA: Diagnosis not present

## 2018-06-18 LAB — BASIC METABOLIC PANEL
BUN / CREAT RATIO: 23 — AB (ref 9–20)
BUN: 21 mg/dL (ref 6–24)
CALCIUM: 8.7 mg/dL (ref 8.7–10.2)
CHLORIDE: 93 mmol/L — AB (ref 96–106)
CO2: 30 mmol/L — ABNORMAL HIGH (ref 20–29)
CREATININE: 0.91 mg/dL (ref 0.76–1.27)
GFR, EST AFRICAN AMERICAN: 109 mL/min/{1.73_m2} (ref >59)
GFR, EST NON AFRICAN AMERICAN: 94 mL/min/{1.73_m2} (ref >59)
Glucose: 107 mg/dL — ABNORMAL HIGH (ref 65–99)
Potassium: 3.7 mmol/L (ref 3.5–5.2)
Sodium: 137 mmol/L (ref 134–144)

## 2018-06-18 LAB — MAGNESIUM: Magnesium: 2 mg/dL (ref 1.6–2.3)

## 2018-06-18 NOTE — Patient Instructions (Signed)
Medication Instructions:  Continue current medications  If you need a refill on your cardiac medications before your next appointment, please call your pharmacy.  Labwork: BMP and Magnesium HERE IN OUR OFFICE AT [facility]  Take the provided lab slips with you to the lab for your blood draw.   You will NOT need to fast   If you have labs (blood work) drawn today and your tests are completely normal, you will receive your results only by: Marland Kitchen MyChart Message (if you have MyChart) OR . A paper copy in the mail If you have any lab test that is abnormal or we need to change your treatment, we will call you to review the results.  Testing/Procedures: None Ordered  Follow-Up: . You will need a follow up appointment in 4 Months with Almyra Deforest.     At [facility], you and your health needs are our priority.  As part of our continuing mission to provide you with exceptional heart care, we have created designated Provider Care Teams.  These Care Teams include your primary Cardiologist (physician) and Advanced Practice Providers (APPs -  Physician Assistants and Nurse Practitioners) who all work together to provide you with the care you need, when you need it.   Thank you for choosing [facility] at [facility]!!

## 2018-06-25 ENCOUNTER — Ambulatory Visit (INDEPENDENT_AMBULATORY_CARE_PROVIDER_SITE_OTHER): Payer: Medicare HMO | Admitting: Pharmacist

## 2018-06-25 DIAGNOSIS — Z5181 Encounter for therapeutic drug level monitoring: Secondary | ICD-10-CM | POA: Diagnosis not present

## 2018-06-25 DIAGNOSIS — I4891 Unspecified atrial fibrillation: Secondary | ICD-10-CM | POA: Diagnosis not present

## 2018-06-25 LAB — POCT INR: INR: 3.1 — AB (ref 2.0–3.0)

## 2018-06-25 NOTE — Patient Instructions (Signed)
Description   Continue taking usual dose of 5 mg daily except 7.5 every Sunday, Tuesday and Thursday.  Recheck INR in 3 weeks.

## 2018-07-01 ENCOUNTER — Telehealth: Payer: Self-pay | Admitting: Cardiology

## 2018-07-01 NOTE — Telephone Encounter (Signed)
New message:       *STAT* If patient is at the pharmacy, call can be transferred to refill team.   1. Which medications need to be refilled? (please list name of each medication and dose if known) traZODone (DESYREL) 50 MG tablet  2. Which pharmacy/location (including street and city if local pharmacy) is medication to be sent to?[place], [place] - [address]  3. Do they need a 30 day or 90 day supply? 30    Pt states he if we can get this to the pharmacy as soon as possible he would appreciate it. Pt states he is aware that Dr. [name] does not prescribe this but he states he ask dr if he would fill it since he is in the process of getting a new PCP.

## 2018-07-01 NOTE — Telephone Encounter (Signed)
New Message          *STAT* If patient is at the pharmacy, call can be transferred to refill team.   1. Which medications need to be refilled? (please list name of each medication and dose if known) traZODone (DESYREL) 50 MG tablet  2. Which pharmacy/location (including street and city if local pharmacy) is medication to be sent to? [place], [place] - [address] [phone] (Phone) [phone] (Fax)     3. Do they need a 30 day or 90 day supply? [place]

## 2018-07-01 NOTE — Telephone Encounter (Signed)
Earvin Hansen, LPN       95/28/41 3:24 PM  Note    Advised patient, verbalized understanding

## 2018-07-01 NOTE — Telephone Encounter (Signed)
Thanks

## 2018-07-01 NOTE — Telephone Encounter (Signed)
Advised patient, verbalized understanding  

## 2018-07-01 NOTE — Telephone Encounter (Signed)
I do not prescribe this 

## 2018-07-01 NOTE — Telephone Encounter (Signed)
Ok to fill 

## 2018-07-01 NOTE — Telephone Encounter (Signed)
I do not prescribe this medication

## 2018-07-05 ENCOUNTER — Telehealth: Payer: Self-pay | Admitting: Cardiology

## 2018-07-05 MED FILL — DIGOXIN 250 MCG TABLET: 250 | 30 days supply | Qty: 30 | Fill #4

## 2018-07-05 NOTE — Telephone Encounter (Signed)
New message   Patient is needing a letter written for the postal service to put a mailbox due to congestive heart failure. Please call to discuss. Patient states that he will pickup the letter at the office.

## 2018-07-05 NOTE — Telephone Encounter (Signed)
Fine

## 2018-07-05 NOTE — Telephone Encounter (Signed)
PATIENT CALLED TO SEE IF DR [name] CAN WRITE  A LETTER -   REQUESTING [employer] CAN MOVE MAILBOX TO PATIENT 'S HOME -- DUE TO HIS INABILITY TO GET TO MAILBOX BECAUSE OF HIS CHF DIAGNOSIS  ,- RIGHT NOW THE MAILBOX IS ABOUT 1/4 MILE AWAY.    PATIENT AWARE  WILL DEFER TO DR Meservey HIM BACK.

## 2018-07-06 NOTE — Telephone Encounter (Signed)
Pt made aware he can pick up letter at front desk

## 2018-07-12 ENCOUNTER — Other Ambulatory Visit: Payer: Self-pay | Admitting: Cardiology

## 2018-07-14 ENCOUNTER — Ambulatory Visit: Payer: Medicare HMO | Attending: Nurse Practitioner | Admitting: Nurse Practitioner

## 2018-07-14 ENCOUNTER — Other Ambulatory Visit: Payer: Self-pay

## 2018-07-14 ENCOUNTER — Other Ambulatory Visit: Payer: Self-pay | Admitting: Cardiology

## 2018-07-14 ENCOUNTER — Encounter: Payer: Self-pay | Admitting: Nurse Practitioner

## 2018-07-14 VITALS — BP 107/64 | HR 61 | Temp 97.6°F | Resp 16 | Ht 61.0 in | Wt 200.0 lb

## 2018-07-14 DIAGNOSIS — M48061 Spinal stenosis, lumbar region without neurogenic claudication: Secondary | ICD-10-CM | POA: Diagnosis not present

## 2018-07-14 DIAGNOSIS — Z9889 Other specified postprocedural states: Secondary | ICD-10-CM | POA: Diagnosis not present

## 2018-07-14 DIAGNOSIS — I5032 Chronic diastolic (congestive) heart failure: Secondary | ICD-10-CM | POA: Diagnosis not present

## 2018-07-14 DIAGNOSIS — E119 Type 2 diabetes mellitus without complications: Secondary | ICD-10-CM | POA: Diagnosis not present

## 2018-07-14 DIAGNOSIS — Z952 Presence of prosthetic heart valve: Secondary | ICD-10-CM | POA: Insufficient documentation

## 2018-07-14 DIAGNOSIS — I252 Old myocardial infarction: Secondary | ICD-10-CM | POA: Diagnosis not present

## 2018-07-14 DIAGNOSIS — Z5181 Encounter for therapeutic drug level monitoring: Secondary | ICD-10-CM | POA: Diagnosis not present

## 2018-07-14 DIAGNOSIS — Z7901 Long term (current) use of anticoagulants: Secondary | ICD-10-CM | POA: Insufficient documentation

## 2018-07-14 DIAGNOSIS — G4733 Obstructive sleep apnea (adult) (pediatric): Secondary | ICD-10-CM | POA: Insufficient documentation

## 2018-07-14 DIAGNOSIS — Z87891 Personal history of nicotine dependence: Secondary | ICD-10-CM | POA: Insufficient documentation

## 2018-07-14 DIAGNOSIS — Z882 Allergy status to sulfonamides status: Secondary | ICD-10-CM | POA: Diagnosis not present

## 2018-07-14 DIAGNOSIS — Z9049 Acquired absence of other specified parts of digestive tract: Secondary | ICD-10-CM | POA: Insufficient documentation

## 2018-07-14 DIAGNOSIS — I2510 Atherosclerotic heart disease of native coronary artery without angina pectoris: Secondary | ICD-10-CM | POA: Insufficient documentation

## 2018-07-14 DIAGNOSIS — Z79891 Long term (current) use of opiate analgesic: Secondary | ICD-10-CM | POA: Insufficient documentation

## 2018-07-14 DIAGNOSIS — G894 Chronic pain syndrome: Secondary | ICD-10-CM | POA: Diagnosis not present

## 2018-07-14 DIAGNOSIS — Z8249 Family history of ischemic heart disease and other diseases of the circulatory system: Secondary | ICD-10-CM | POA: Insufficient documentation

## 2018-07-14 DIAGNOSIS — Z833 Family history of diabetes mellitus: Secondary | ICD-10-CM | POA: Diagnosis not present

## 2018-07-14 DIAGNOSIS — Z951 Presence of aortocoronary bypass graft: Secondary | ICD-10-CM | POA: Diagnosis not present

## 2018-07-14 DIAGNOSIS — Z823 Family history of stroke: Secondary | ICD-10-CM | POA: Diagnosis not present

## 2018-07-14 DIAGNOSIS — M47816 Spondylosis without myelopathy or radiculopathy, lumbar region: Secondary | ICD-10-CM | POA: Diagnosis not present

## 2018-07-14 DIAGNOSIS — I110 Hypertensive heart disease with heart failure: Secondary | ICD-10-CM | POA: Insufficient documentation

## 2018-07-14 DIAGNOSIS — Z880 Allergy status to penicillin: Secondary | ICD-10-CM | POA: Insufficient documentation

## 2018-07-14 DIAGNOSIS — Z79899 Other long term (current) drug therapy: Secondary | ICD-10-CM | POA: Diagnosis not present

## 2018-07-14 DIAGNOSIS — Z888 Allergy status to other drugs, medicaments and biological substances status: Secondary | ICD-10-CM | POA: Insufficient documentation

## 2018-07-14 DIAGNOSIS — M7918 Myalgia, other site: Secondary | ICD-10-CM | POA: Insufficient documentation

## 2018-07-14 DIAGNOSIS — M109 Gout, unspecified: Secondary | ICD-10-CM | POA: Insufficient documentation

## 2018-07-14 DIAGNOSIS — I071 Rheumatic tricuspid insufficiency: Secondary | ICD-10-CM | POA: Insufficient documentation

## 2018-07-14 DIAGNOSIS — Z6837 Body mass index (BMI) 37.0-37.9, adult: Secondary | ICD-10-CM | POA: Insufficient documentation

## 2018-07-14 DIAGNOSIS — F419 Anxiety disorder, unspecified: Secondary | ICD-10-CM | POA: Insufficient documentation

## 2018-07-14 DIAGNOSIS — I371 Nonrheumatic pulmonary valve insufficiency: Secondary | ICD-10-CM | POA: Insufficient documentation

## 2018-07-14 DIAGNOSIS — Z8673 Personal history of transient ischemic attack (TIA), and cerebral infarction without residual deficits: Secondary | ICD-10-CM | POA: Diagnosis not present

## 2018-07-14 DIAGNOSIS — C6290 Malignant neoplasm of unspecified testis, unspecified whether descended or undescended: Secondary | ICD-10-CM | POA: Insufficient documentation

## 2018-07-14 DIAGNOSIS — Z800 Family history of malignant neoplasm of digestive organs: Secondary | ICD-10-CM | POA: Insufficient documentation

## 2018-07-14 MED ORDER — METHOCARBAMOL 500 MG PO TABS: 500.0000 mg | ORAL_TABLET | Freq: Three times a day (TID) | ORAL | 0 refills | Status: AC

## 2018-07-14 MED ORDER — OXYCODONE HCL 5 MG PO TABS: 5.0000 mg | ORAL_TABLET | Freq: Three times a day (TID) | ORAL | 0 refills | Status: DC | PRN

## 2018-07-14 MED ORDER — OXYCODONE HCL 5 MG PO TABS: 5.0000 mg | ORAL_TABLET | Freq: Three times a day (TID) | ORAL | 0 refills | Status: AC | PRN

## 2018-07-14 MED ORDER — POTASSIUM CHLORIDE ER 10 MEQ PO TBCR: EXTENDED_RELEASE_TABLET | ORAL | 0 refills | Status: DC

## 2018-07-14 NOTE — Progress Notes (Signed)
Nursing Pain Medication Assessment:  Safety precautions to be maintained throughout the outpatient stay will include: orient to surroundings, keep bed in low position, maintain call bell within reach at all times, provide assistance with transfer out of bed and ambulation.  Medication Inspection Compliance: Lance Cook did not comply with our request to bring his pills to be counted. He was reminded that bringing the medication bottles, even when empty, is a requirement.  Medication: None brought in. Pill/Patch Count: None available to be counted. Bottle Appearance: No container available. Did not bring bottle(s) to appointment. Filled Date: N/A Last Medication intake:  Today

## 2018-07-14 NOTE — Progress Notes (Signed)
Patient's Name: Lance Cook  MRN: [id number]  Referring Provider: [name], PA-C  DOB: 1960/12/23  PCP: Selinda Orion  DOS: 07/14/2018  Note by: Vevelyn Francois NP  Service setting: Ambulatory outpatient  Specialty: Interventional Pain Management  Location: [facility] (AMB) Pain Management Facility    Patient type: Established    Primary Reason(s) for Visit: Encounter for prescription drug management. (Level of risk: moderate)  CC: Back Pain (lumbar bilateral )  HPI  Lance Cook is a 57 y.o. year old, male patient, who comes today for a medication management evaluation. 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; Ascending aortic dissection ([place]); Chronic diastolic congestive heart failure (HCC); 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; Enteritis due to Clostridium difficile; Hypomagnesemia; Physical deconditioning; Long term current use of opiate analgesic; Long term prescription opiate use; Opiate use (22.5 MME/day); Opiate dependence ([place]); Encounter for therapeutic drug level monitoring; Chronic low back pain (midline); Lumbar facet syndrome; Personal history of transient ischemic attack (TIA), and cerebral infarction without residual deficits; ED (erectile dysfunction) of organic origin; Eunuchoidism; Adult BMI 30+; Testicular hypofunction; Lumbar canal stenosis; Malignant neoplasm of testis ([place]); Chronic  lower extremity pain (Left); Personal history of malignant neoplasm of testis; Musculoskeletal pain; History of colonic polyps; Chronic diarrhea; Benign neoplasm of transverse colon; Scrotal swelling; Chronic pain syndrome; Acute on chronic diastolic CHF (congestive heart failure) (HCC); S/P MVR (mitral valve replacement); H/O TIA (transient ischemic attack) and stroke; Heart valve replaced by other means; HTN (hypertension); Low back pain; Lymphadenopathy; Male hypogonadism; Obesity (BMI 30-39.9); Other testicular hypofunction; Presence of other heart-valve replacement; S/P AVR (aortic valve replacement); Spinal stenosis of lumbar region without neurogenic claudication; Spondylosis; Type 2 diabetes mellitus with other specified complication ([place]); Type II or unspecified type diabetes mellitus with other specified manifestations, not stated as uncontrolled; Leg swelling; Unilateral vocal cord paralysis; Insomnia; 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 His primarily concern today is the Back Pain (lumbar bilateral )  Pain Assessment: Location: Lower, Left, Right Back Radiating: denies  Onset: More than a month ago Duration: Chronic pain Quality: Sharp Severity: 5 /10 (subjective, self-reported pain score)  Note: Reported level is compatible with observation.                          Effect on ADL: patient unable to do much at all d/t pain, pretty much sedentary.  patient reports that he is having spasms everyday all day in back, hands, chest, legs and feet.  also reports being dizzy  a lot.  Timing: Constant Modifying factors: medications ar working BP: 107/64  HR: 61  Lance Cook was  last scheduled for an appointment on 04/13/2018 for medication management. During today's appointment we reviewed Lance Cook's chronic pain status, as well as his outpatient medication  regimen. He admits that his pain is stable.  He denies any current concerns.  He admits that the compound cream was effective he would like to have this refilled.  He is requesting to have his trazodone taken over by [place] instead of his primary care.  He did suffer a fall yesterday secondary to dizziness.  He has a history of sleep apnea is currently on a CPAP.  The patient  reports that he does not use drugs. His body mass index is 37.79 kg/m.  Further details on both, my assessment(s), as well as the proposed treatment plan, please see below.  Controlled Substance Pharmacotherapy Assessment REMS (Risk Evaluation and Mitigation Strategy)  Analgesic:Oxycodone IR 7m q 8hrs (15 mg/day of oxycodone) MME/day:22.5 mg/day [name]  07/14/2018  1:14 PM  Sign at close encounter Nursing Pain Medication Assessment:  Safety precautions to be maintained throughout the outpatient stay will include: orient to surroundings, keep bed in low position, maintain call bell within reach at all times, provide assistance with transfer out of bed and ambulation.  Medication Inspection Compliance: Mr. CBiehndid not comply with our request to bring his pills to be counted. He was reminded that bringing the medication bottles, even when empty, is a requirement.  Medication: None brought in. Pill/Patch Count: None available to be counted. Bottle Appearance: No container available. Did not bring bottle(s) to appointment. Filled Date: N/A Last Medication intake:  Today   Pharmacokinetics: Liberation and absorption (onset of action): WNL Distribution (time to peak effect): WNL Metabolism and excretion (duration of action): WNL         Pharmacodynamics: Desired effects: Analgesia: Mr. CFontenettereports >50% benefit. Functional  ability: Patient reports that medication allows him to accomplish basic ADLs Clinically meaningful improvement in function (CMIF): Sustained CMIF goals met Perceived effectiveness: Described as relatively effective, allowing for increase in activities of daily living (ADL) Undesirable effects: Side-effects or Adverse reactions: None reported Monitoring: [place] PMP: Online review of the past 158-montheriod conducted. Compliant with practice rules and regulations Last UDS on record: Summary  Date Value Ref Range Status  01/12/2018 FINAL  Final    Comment:    ==================================================================== TOXASSURE SELECT 13 (MW) ==================================================================== Test                             Result       Flag       Units Drug Present and Declared for Prescription Verification   Oxycodone                      183          EXPECTED   ng/mg creat   Oxymorphone                    67           EXPECTED   ng/mg creat   Noroxycodone                   744          EXPECTED   ng/mg creat    Sources of oxycodone include scheduled prescription medications.    Oxymorphone and noroxycodone are expected metabolites of    oxycodone. Oxymorphone is also available as a scheduled    prescription medication. ==================================================================== Test  Result    Flag   Units      Ref Range   Creatinine              101              mg/dL      >=20 ==================================================================== Declared Medications:  The flagging and interpretation on this report are based on the  following declared medications.  Unexpected results may arise from  inaccuracies in the declared medications.  **Note: The testing scope of this panel includes these medications:  Oxycodone  **Note: The testing scope of this panel does not include following  reported medications:  Acetaminophen  (Tylenol)  Allopurinol (Zyloprim)  Atenolol (Tenormin)  Digoxin  Furosemide  Magnesium Oxide  Methocarbamol  Polymyxin (PolyTrim)  Potassium  Topical  Trazodone (Desyrel)  Trimethoprim (PolyTrim)  Warfarin (Coumadin) ==================================================================== For clinical consultation, please call [phone]. ====================================================================    UDS interpretation: Compliant          Medication Assessment Form: Reviewed. Patient indicates being compliant with therapy Treatment compliance: Compliant Risk Assessment Profile: Aberrant behavior: See prior evaluations. None observed or detected today Comorbid factors increasing risk of overdose: See prior notes. No additional risks detected today Opioid risk tool (ORT) (Total Score): 0 Personal History of Substance Abuse (SUD-Substance use disorder):  Alcohol: Negative  Illegal Drugs: Negative  Rx Drugs: Negative  ORT Risk Level calculation: Low Risk Risk of substance use disorder (SUD): Low Opioid Risk Tool - 07/14/18 1400      Family History of Substance Abuse   Alcohol  Negative    Illegal Drugs  Negative    Rx Drugs  Negative      Personal History of Substance Abuse   Alcohol  Negative    Illegal Drugs  Negative    Rx Drugs  Negative      Age   Age between 66-45 years   No      History of Preadolescent Sexual Abuse   History of Preadolescent Sexual Abuse  Negative or Male      Psychological Disease   Psychological Disease  Negative    Depression  Negative      Total Score   Opioid Risk Tool Scoring  0    Opioid Risk Interpretation  Low Risk      ORT Scoring interpretation table:  Score <3 = Low Risk for SUD  Score between 4-7 = Moderate Risk for SUD  Score >8 = High Risk for Opioid Abuse   Risk Mitigation Strategies:  Patient Counseling: Covered Patient-Prescriber Agreement (PPA): Present and active  Notification to other healthcare  providers: Done  Pharmacologic Plan: No change in therapy, at this time.             Laboratory Chemistry  Inflammation Markers (CRP: Acute Phase) (ESR: Chronic Phase) Lab Results  Component Value Date   CRP 0.9 10/09/2015   ESRSEDRATE 9 01/14/2016   LATICACIDVEN 1.59 02/05/2016                         Rheumatology Markers Lab Results  Component Value Date   LABURIC 12.1 (H) 11/01/2009                        Renal Function Markers Lab Results  Component Value Date   BUN 21 06/18/2018   CREATININE 0.91 06/18/2018   BCR 23 (H) 06/18/2018   GFRAA 109 06/18/2018   GFRNONAA 94  06/18/2018                             Hepatic Function Markers Lab Results  Component Value Date   AST 20 01/14/2018   ALT 16 (L) 01/14/2018   ALBUMIN 2.0 (L) 01/14/2018   ALKPHOS 60 01/14/2018                        Electrolytes Lab Results  Component Value Date   NA 137 06/18/2018   K 3.7 06/18/2018   CL 93 (L) 06/18/2018   CALCIUM 8.7 06/18/2018   MG 2.0 06/18/2018   PHOS 3.5 02/09/2015                        Neuropathy Markers Lab Results  Component Value Date   VITAMINB12 371 01/07/2012   FOLATE 15.1 01/07/2012   HGBA1C 5.9 (H) 01/22/2015                        CNS Tests No results found for: COLORCSF, APPEARCSF, RBCCOUNTCSF, WBCCSF, POLYSCSF, LYMPHSCSF, EOSCSF, PROTEINCSF, GLUCCSF, JCVIRUS, CSFOLI, IGGCSF                      Bone Pathology Markers Lab Results  Component Value Date   TESTOFREE 49.5 03/22/2012   TESTOSTERONE 206.14 (L) 08/18/2012                         Coagulation Parameters Lab Results  Component Value Date   INR 3.1 (A) 06/25/2018   LABPROT 60.8 (H) 11/09/2017   APTT 41 (H) 01/24/2015   PLT 241 01/14/2018                        Cardiovascular Markers Lab Results  Component Value Date   BNP 142.5 (H) 01/14/2018   CKTOTAL 51 01/28/2018   CKMB 3.1 10/28/2009   TROPONINI <0.03 01/15/2018   HGB 11.3 (L) 01/14/2018   HCT 38.9 (L) 01/14/2018                          CA Markers No results found for: CEA, CA125, LABCA2                      Note: Lab results reviewed.  Recent Diagnostic Imaging Results  ECHOCARDIOGRAM COMPLETE                         [facility]                        [address] [place],  [address]                            [phone]  ------------------------------------------------------------------- Transthoracic Echocardiography  Patient:    Harce, Volden MR #:       [id number] Study Date: 02/03/2018 Gender:     M Age:        68 Height:     154.9 cm Weight:     96.4 kg BSA:  2.09 m^2 Pt. Status: Room:   ATTENDING    [name], MD  ORDERING     [name], MD  [place], MD  SONOGRAPHER  [place] Dezza, Outpatient  cc:  ------------------------------------------------------------------- LV EF: 55% -   60%  ------------------------------------------------------------------- Indications:      Z95.2 Mitral valve replacement. I35.9 Aortic valve disorder.  ------------------------------------------------------------------- History:   PMH:  Obstructive sleep apnea.  Coronary artery disease.  Congestive heart failure.  Stroke.  PMH:   Myocardial infarction.   ------------------------------------------------------------------- Study Conclusions  - Left ventricle: The cavity size was normal. Wall thickness was   increased in a pattern of mild LVH. Systolic function was normal.   The estimated ejection fraction was in the range of 55% to 60%. - Aortic valve: Mechanical St Jude AVR with stable gradients since   2017 and no significant peri valvular regurgitation. - Mitral valve: Normal appearing mechanical St Jude MVR. - Left atrium: The atrium was severely dilated. - Atrial septum: No defect or patent foramen ovale was identified. - Tricuspid valve: There was moderate regurgitation. - Pulmonary  arteries: PA peak pressure: 64 mm Hg (S).  ------------------------------------------------------------------- Study data:  Comparison was made to the study of 01/17/2016.  Study status:  Routine.  Procedure:  The patient reported no pain pre or post test. Transthoracic echocardiography. Image quality was adequate.  Study completion:  There were no complications. Transthoracic echocardiography.  M-mode, complete 2D, spectral Doppler, and color Doppler.  Birthdate:  Patient birthdate: 09/04/1960.  Age:  Patient is 57 yr old.  Sex:  Gender: male. BMI: 40.2 kg/m^2.  Blood pressure:     128/69  Patient status: Outpatient.  Study date:  Study date: 02/03/2018. Study time: 10:55 AM.  Location:   [facility]  -------------------------------------------------------------------  ------------------------------------------------------------------- Left ventricle:  The cavity size was normal. Wall thickness was increased in a pattern of mild LVH. Systolic function was normal. The estimated ejection fraction was in the range of 55% to 60%.  ------------------------------------------------------------------- Aortic valve:  Mechanical St Jude AVR with stable gradients since 2017 and no significant peri valvular regurgitation.  Doppler: VTI ratio of LVOT to aortic valve: 0.15. Peak velocity ratio of LVOT to aortic valve: 0.14. Mean velocity ratio of LVOT to aortic valve: 0.14.    Mean gradient (S): 24 mm Hg. Peak gradient (S): 44 mm Hg.  ------------------------------------------------------------------- Mitral valve:  Normal appearing mechanical St Jude MVR.  Doppler:   Valve area by pressure half-time: 3.44 cm^2. Indexed valve area by pressure half-time: 1.65 cm^2/m^2.    Mean gradient (D): 5 mm Hg.  ------------------------------------------------------------------- Left atrium:  The atrium was severely  dilated.  ------------------------------------------------------------------- Atrial septum:  No defect or patent foramen ovale was identified.   ------------------------------------------------------------------- Right ventricle:  The cavity size was normal. Wall thickness was normal. Systolic function was normal.  ------------------------------------------------------------------- Pulmonic valve:    Doppler:  There was mild regurgitation.  ------------------------------------------------------------------- Tricuspid valve:   Doppler:  There was moderate regurgitation.   ------------------------------------------------------------------- Right atrium:  The atrium was normal in size.  ------------------------------------------------------------------- Pericardium:  The pericardium was normal in appearance.  ------------------------------------------------------------------- Systemic veins: Inferior vena cava: The vessel was normal in size. The respirophasic diameter changes were in the normal range (>= 50%), consistent with normal central venous pressure.  ------------------------------------------------------------------- Measurements   Left ventricle  Value          Reference  LV ID, ED, PLAX chordal                47.9  mm       43 - 52  LV ID, ES, PLAX chordal                33.3  mm       23 - 38  LV fx shortening, PLAX chordal         30    %        >=29  LV PW thickness, ED                    13.5  mm       ---------  IVS/LV PW ratio, ED                    1.07           <=1.3    Ventricular septum                     Value          Reference  IVS thickness, ED                      14.4  mm       ---------    LVOT                                   Value          Reference  LVOT peak velocity, S                  46.9  cm/s     ---------  LVOT mean velocity, S                  31.7  cm/s     ---------  LVOT VTI, S                            9.96   cm       ---------  LVOT peak gradient, S                  1     mm Hg    ---------    Aortic valve                           Value          Reference  Aortic valve peak velocity, S          333   cm/s     ---------  Aortic valve mean velocity, S          224   cm/s     ---------  Aortic valve VTI, S                    67    cm       ---------  Aortic mean gradient, S                24    mm Hg    ---------  Aortic peak gradient, S  44    mm Hg    ---------  VTI ratio, LVOT/AV                     0.15           ---------  Velocity ratio, peak, LVOT/AV          0.14           ---------  Velocity ratio, mean, LVOT/AV          0.14           ---------    Left atrium                            Value          Reference  LA ID, A-P, ES                         74    mm       ---------  LA ID/bsa, A-P                 (H)     3.54  cm/m^2   <=2.2  LA volume, S                           234   ml       ---------  LA volume/bsa, S                       111.9 ml/m^2   ---------  LA volume, ES, 1-p A4C                 252   ml       ---------  LA volume/bsa, ES, 1-p A4C             120.5 ml/m^2   ---------  LA volume, ES, 1-p A2C                 199   ml       ---------  LA volume/bsa, ES, 1-p A2C             95.2  ml/m^2   ---------    Mitral valve                           Value          Reference  Mitral mean velocity, D                89.3  cm/s     ---------  Mitral mean gradient, D                5     mm Hg    ---------  Mitral valve area, PHT, DP             3.44  cm^2     ---------  Mitral valve area/bsa, PHT, DP         1.65  cm^2/m^2 ---------  Mitral annulus VTI, D                  34    cm       ---------    Pulmonary arteries  Value          Reference  PA pressure, S, DP             (H)     64    mm Hg    <=30    Tricuspid valve                        Value          Reference  Tricuspid regurg peak velocity         350   cm/s     ---------   Tricuspid peak RV-RA gradient          49    mm Hg    ---------    Systemic veins                         Value          Reference  Estimated CVP                          15    mm Hg    ---------    Right ventricle                        Value          Reference  RV pressure, S, DP             (H)     64    mm Hg    <=30  Legend: (L)  and  (H)  mark values outside specified reference range.  ------------------------------------------------------------------- Prepared and Electronically Authenticated by  Jenkins Rouge, M.D. 2019-06-05T11:50:34  Complexity Note: Imaging results reviewed. Results shared with Lance Cook, using Layman's terms.                         Meds   Current Outpatient Medications:  .  acetaminophen (TYLENOL) 500 MG tablet, Take 500-1,000 mg by mouth daily as needed for moderate pain or headache. , Disp: , Rfl:  .  allopurinol (ZYLOPRIM) 300 MG tablet, Take 300 mg by mouth daily., Disp: , Rfl:  .  atenolol (TENORMIN) 100 MG tablet, Take 25 mg by mouth 2 (two) times daily., Disp: , Rfl:  .  digoxin (LANOXIN) 0.25 MG tablet, Take 1 tablet (250 mcg total) by mouth daily., Disp: 90 tablet, Rfl: 2 .  enoxaparin (LOVENOX) 100 MG/ML injection, Inject 100 mg into the skin 2 (two) times daily., Disp: , Rfl:  .  furosemide (LASIX) 80 MG tablet, Take 80 mg by mouth 2 (two) times daily. , Disp: , Rfl:  .  Homeopathic Products (SIMILASAN PINK EYE RELIEF) SOLN, Place 1 drop into both eyes daily as needed (irritation)., Disp: , Rfl:  .  magnesium oxide (MAG-OX) 400 (241.3 MG) MG tablet, Take 1 tablet (400 mg total) by mouth 2 (two) times daily., Disp: 180 tablet, Rfl: 3 .  [START ON 07/30/2018] methocarbamol (ROBAXIN) 500 MG tablet, Take 1 tablet (500 mg total) by mouth 3 (three) times daily., Disp: 270 tablet, Rfl: 0 .  metolazone (ZAROXOLYN) 5 MG tablet, Take 1 tablet (5 mg total) by mouth once a week., Disp: 14 tablet, Rfl: 3 .  mupirocin ointment (BACTROBAN) 2 %, Apply 1  application topically 2 (two) times daily as needed (infection prevention). , Disp: , Rfl:  .  NON FORMULARY, Apply 2 Pump topically 3 (three) times daily. LIDO5/DICLO3/CYCLO2/GABA 2, Disp: , Rfl:  .  [START ON 09/28/2018] oxyCODONE (OXY IR/ROXICODONE) 5 MG immediate release tablet, Take 1 tablet (5 mg total) by mouth every 8 (eight) hours as needed for severe pain., Disp: 90 tablet, Rfl: 0 .  Potassium 99 MG TABS, Take 99-198 mg by mouth daily as needed (cramping)., Disp: , Rfl:  .  traZODone (DESYREL) 50 MG tablet, Take 100 mg by mouth at bedtime. , Disp: , Rfl:  .  warfarin (COUMADIN) 5 MG tablet, Take 5-7.5 mg by mouth See admin instructions. Take 5 mg by mouth once daily in the evening on Mon, Wed, Fri and Sat.  Take 7.5 mg once daily in the evening on Tues, Thurs, and Sun, Disp: , Rfl:  .  [START ON 08/29/2018] oxyCODONE (OXY IR/ROXICODONE) 5 MG immediate release tablet, Take 1 tablet (5 mg total) by mouth every 8 (eight) hours as needed for moderate pain or severe pain., Disp: 90 tablet, Rfl: 0 .  [START ON 07/29/2018] oxyCODONE (OXY IR/ROXICODONE) 5 MG immediate release tablet, Take 1 tablet (5 mg total) by mouth every 8 (eight) hours as needed for severe pain., Disp: 90 tablet, Rfl: 0 .  potassium chloride (K-DUR) 10 MEQ tablet, TAKE 2 TABLETS BY MOUTH TWICE DAILY, Disp: 362 tablet, Rfl: 0  ROS  Constitutional: Denies any fever or chills Gastrointestinal: No reported hemesis, hematochezia, vomiting, or acute GI distress Musculoskeletal: Denies any acute onset joint swelling, redness, loss of ROM, or weakness Neurological: No reported episodes of acute onset apraxia, aphasia, dysarthria, agnosia, amnesia, paralysis, loss of coordination, or loss of consciousness  Allergies  Lance Cook is allergic to penicillins; adhesive [tape]; and bactrim [sulfamethoxazole-trimethoprim].  Lance Cook  Drug: Lance Cook  reports that he does not use drugs. Alcohol:  reports that he drinks alcohol. Tobacco:   reports that he has quit smoking. His smoking use included cigars. He has never used smokeless tobacco. Medical:  has a past medical history of Acute on chronic diastolic CHF (congestive heart failure) ([place]) (08/27/2016), Anemia, Anxiety, ARDS (adult respiratory distress syndrome) ([place]) (01/27/2015), Arthritis, Ascending aortic dissection ([place]) (07/14/2008), Asymptomatic chronic venous hypertension (01/15/2010), Atrial fibrillation ([place]), Bell's palsy, C. difficile diarrhea, CAD (coronary artery disease), Cellulitis (04/02/2014), Cerebral artery occlusion with cerebral infarction ([place]) (10/08/2011), Chronic diastolic heart failure ([place]) (03/01/2015), Chronic LBP (10/08/2011), CVA (cerebral vascular accident) ([place]) (10/08/2011), DIABETES MELLITUS, TYPE II (11/01/2009), Diverticulosis, 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), Encephalopathy acute (02/05/2015), Gastric polyps, GERD (gastroesophageal reflux disease), Gout, H/O mechanical aortic valve replacement (11/01/2009), Hiatal hernia, History of colon polyps (12/23/2011), HTN (hypertension) (02/23/2014), Hyperlipidemia, Hypertension, Hypogonadism male (04/08/2012), Impaired glucose tolerance (10/08/2011), Morbid obesity ([place]) (04/02/2014), Myocardial infarction [facility]) (age 32), OSA (obstructive sleep apnea), Peripheral vascular disease ([place]), 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 (7/37/1062), 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 Surgical: Lance Cook  has a past surgical history that includes Laser ablation (Left, 03/06/2010); Cardiac valve replacement; Cholecystectomy (2011); Tonsillectomy (1967); Otoplasty; Orchiectomy (age 74); TEE without  cardioversion (N/A, 10/11/2014); Bentall procedure (1988); Coronary artery bypass graft; aortic truck; TEE without cardioversion (N/A, 01/24/2015); Mitral valve replacement (Right, 01/24/2015); Tooth extraction (2016); Colonoscopy with propofol (N/A, 06/17/2016); Colonoscopy with propofol (N/A, 06/01/2018); polypectomy (06/01/2018); and biopsy (06/01/2018). Family:  family history includes Cancer in his maternal aunt and maternal uncle; Diabetes in his father; Heart disease in his mother; Hypertension in his other; Other in his mother; Stroke in his other; Throat cancer in his mother.  Constitutional Exam  General appearance: alert, cooperative, in no distress and moderately obese Vitals:   07/14/18 1257  BP: 107/64  Pulse: 61  Resp: 16  Temp: 97.6 F (36.4 C)  TempSrc: Oral  SpO2: 100%  Weight: 200 lb (90.7 kg)  Height: 5' 1"  (1.549 m)  Psych/Mental status: Alert, oriented x 3 (person, place, & time)       Eyes: PERLA Respiratory: No evidence of acute respiratory distress   Lumbar Spine Area Exam  Skin & Axial Inspection: No masses, redness, or swelling Alignment: Symmetrical Functional ROM: Unrestricted ROM       Stability: No instability detected Muscle Tone/Strength: Functionally intact. No obvious neuro-muscular anomalies detected. Sensory (Neurological): Unimpaired Palpation: No palpable anomalies       Provocative Tests: Hyperextension/rotation test: deferred today       Lumbar quadrant test (Kemp's test): deferred today       Lateral bending test: deferred today       Patrick's Maneuver: deferred today                   FABER test: deferred today                   S-I anterior distraction/compression test: deferred today         S-I lateral compression test: deferred today         S-I Thigh-thrust test: deferred today         S-I Gaenslen's test: deferred today          Gait & Posture Assessment  Ambulation: Unassisted Gait: Relatively normal for age and body  habitus Posture: WNL   Lower Extremity Exam    Side: Right lower extremity  Side: Left lower extremity  Stability: No instability observed          Stability: No instability observed          Skin & Extremity Inspection: Skin color, temperature, and hair growth are WNL. No peripheral edema or cyanosis. No masses, redness, swelling, asymmetry, or associated skin lesions. No contractures.  Skin & Extremity Inspection: Skin color, temperature, and hair growth are WNL. No peripheral edema or cyanosis. No masses, redness, swelling, asymmetry, or associated skin lesions. No contractures.  Functional ROM: Unrestricted ROM                  Functional ROM: Unrestricted ROM                  Muscle Tone/Strength: Functionally intact. No obvious neuro-muscular anomalies detected.  Muscle Tone/Strength: Functionally intact. No obvious neuro-muscular anomalies detected.  Sensory (Neurological): Unimpaired  Sensory (Neurological): Unimpaired  Palpation: No palpable anomalies  Palpation: No palpable anomalies   Assessment  Primary Diagnosis & Pertinent Problem List: The primary encounter diagnosis was Lumbar facet syndrome. Diagnoses of Spinal stenosis of lumbar region, unspecified whether neurogenic claudication present, Musculoskeletal pain, Chronic pain syndrome, and Long term prescription opiate use were also pertinent to this visit.  Status Diagnosis  Controlled Controlled Controlled 1. Lumbar facet syndrome   2. Spinal stenosis of lumbar region, unspecified whether neurogenic claudication present   3. Musculoskeletal pain   4. Chronic pain syndrome   5. Long term prescription opiate use     Problems updated and  reviewed during this visit: No problems updated. Plan of Care  Pharmacotherapy (Medications Ordered): Meds ordered this encounter  Medications  . methocarbamol (ROBAXIN) 500 MG tablet    Sig: Take 1 tablet (500 mg total) by mouth 3 (three) times daily.    Dispense:  270 tablet     Refill:  0    Do not place this medication, or any other prescription from our practice, on "Automatic Refill". Patient may have prescription filled one day early if pharmacy is closed on scheduled refill date.    Order Specific Question:   Supervising Provider    Answer:   Milinda Pointer [phone]  . oxyCODONE (OXY IR/ROXICODONE) 5 MG immediate release tablet    Sig: Take 1 tablet (5 mg total) by mouth every 8 (eight) hours as needed for severe pain.    Dispense:  90 tablet    Refill:  0    Do not place this medication, or any other prescription from our practice, on "Automatic Refill". Patient may have prescription filled one day early if pharmacy is closed on schedule.    Order Specific Question:   Supervising Provider    Answer:   Milinda Pointer [phone]  . oxyCODONE (OXY IR/ROXICODONE) 5 MG immediate release tablet    Sig: Take 1 tablet (5 mg total) by mouth every 8 (eight) hours as needed for moderate pain or severe pain.    Dispense:  90 tablet    Refill:  0    Do not place this medication, or any other prescription from our practice, on "Automatic Refill". Patient may have prescription filled one day early if pharmacy is closed on scheduled refill date.    Order Specific Question:   Supervising Provider    Answer:   Milinda Pointer [phone]  . oxyCODONE (OXY IR/ROXICODONE) 5 MG immediate release tablet    Sig: Take 1 tablet (5 mg total) by mouth every 8 (eight) hours as needed for severe pain.    Dispense:  90 tablet    Refill:  0    Do not place this medication, or any other prescription from our practice, on "Automatic Refill". Patient may have prescription filled one day early if pharmacy is closed on schedule.    Order Specific Question:   Supervising Provider    Answer:   Milinda Pointer [[id number]]   New Prescriptions   No medications on file   Medications administered today: Lance Cook had no medications administered during this visit. Lab-work, procedure(s),  and/or referral(s): Orders Placed This Encounter  Procedures  . ToxASSURE Select 13 (MW), Urine   Imaging and/or referral(s): None  Interventional therapies: Planned, scheduled, and/or pending:   Not at this time.  Declined taking over the trazodone secondary to comorbidities   Considering:  Bilateral lumbar facet RFA.    Palliative PRN treatment(s):  Not at this time    Provider-requested follow-up: Return in about 3 months (around 10/14/2018) for MedMgmt.  Future Appointments  Date Time Provider [place]  07/16/2018 11:45 AM [facility]  10/11/2018  1:30 PM Vevelyn Francois, NP [facility] None  05/10/2019 10:30 AM Deneise Lever, MD [place] None   Primary Care Physician: Selinda Orion Location: [facility] Note by: Vevelyn Francois NP Date: 07/14/2018; Time: 2:25 PM  Pain Score Disclaimer: We use the NRS-11 scale. This is a self-reported, subjective measurement of pain severity with only modest accuracy. It is used primarily to identify changes within a particular  patient. It must be understood that outpatient pain scales are significantly less accurate that those used for research, where they can be applied under ideal controlled circumstances with minimal exposure to variables. In reality, the score is likely to be a combination of pain intensity and pain affect, where pain affect describes the degree of emotional arousal or changes in action readiness caused by the sensory experience of pain. Factors such as social and work situation, setting, emotional state, anxiety levels, expectation, and prior pain experience may influence pain perception and show large inter-individual differences that may also be affected by time variables.  Patient instructions provided during this appointment: Patient Instructions   ____________________________________________________________________________________________  Medication Rules  Applies to: All patients receiving prescriptions (written or electronic).  Pharmacy of record: Pharmacy where electronic prescriptions will be sent. If written prescriptions are taken to a different pharmacy, please inform the nursing staff. The pharmacy listed in the electronic medical record should be the one where you would like electronic prescriptions to be sent.  Prescription refills: Only during scheduled appointments. Applies to both, written and electronic prescriptions.  NOTE: The following applies primarily to controlled substances (Opioid* Pain Medications).   Patient's responsibilities: 1. Pain Pills: Bring all pain pills to every appointment (except for procedure appointments). 2. Pill Bottles: Bring pills in original pharmacy bottle. Always bring newest bottle. Bring bottle, even if empty. 3. Medication refills: You are responsible for knowing and keeping track of what medications you need refilled. The day before your appointment, write a list of all prescriptions that need to be refilled. Bring that list to your appointment and give it to the admitting nurse. Prescriptions will be written only during appointments. If you forget a medication, it will not be "Called in", "Faxed", or "electronically sent". You will need to get another appointment to get these prescribed. 4. Prescription Accuracy: You are responsible for carefully inspecting your prescriptions before leaving our office. Have the discharge nurse carefully go over each prescription with you, before taking them home. Make sure that your name is accurately spelled, that your address is correct. Check the name and dose of your medication to make sure it is accurate. Check the number of pills, and the written instructions to make sure they are clear and accurate. Make sure that you are given enough medication to  last until your next medication refill appointment. 5. Taking Medication: Take medication as prescribed. Never take more pills than instructed. Never take medication more frequently than prescribed. Taking less pills or less frequently is permitted and encouraged, when it comes to controlled substances (written prescriptions).  6. Inform other Doctors: Always inform, all of your healthcare providers, of all the medications you take. 7. Pain Medication from other Providers: You are not allowed to accept any additional pain medication from any other Doctor or Healthcare provider. There are two exceptions to this rule. (see below) In the event that you require additional pain medication, you are responsible for notifying us, as stated below. 8. Medication Agreement: You are responsible for carefully reading and following our Medication Agreement. This must be signed before receiving any prescriptions from our practice. Safely store a copy of your signed Agreement. Violations to the Agreement will result in no further prescriptions. (Additional copies of our Medication Agreement are available upon request.) 9. Laws, Rules, & Regulations: All patients are expected to follow all Federal and [employer], [employer], Rules, [employer]. Ignorance of the Laws does not constitute a valid excuse. The use of any illegal  substances is prohibited. 10. Adopted CDC guidelines & recommendations: Target dosing levels will be at or below 60 MME/day. Use of benzodiazepines** is not recommended.  Exceptions: There are only two exceptions to the rule of not receiving pain medications from other Healthcare Providers. 1. Exception #1 (Emergencies): In the event of an emergency (i.e.: accident requiring emergency care), you are allowed to receive additional pain medication. However, you are responsible for: As soon as you are able, call our office [phone], at any time of the day or night, and leave a message stating your  name, the date and nature of the emergency, and the name and dose of the medication prescribed. In the event that your call is answered by a member of our staff, make sure to document and save the date, time, and the name of the person that took your information.  2. Exception #2 (Planned Surgery): In the event that you are scheduled by another doctor or dentist to have any type of surgery or procedure, you are allowed (for a period no longer than 30 days), to receive additional pain medication, for the acute post-op pain. However, in this case, you are responsible for picking up a copy of our "Post-op Pain Management for Surgeons" handout, and giving it to your surgeon or dentist. This document is available at our office, and does not require an appointment to obtain it. Simply go to our office during business hours (Monday-Thursday from 8:00 AM to 4:00 PM) (Friday 8:00 AM to 12:00 Noon) or if you have a scheduled appointment with [place], prior to your surgery, and ask for it by name. In addition, you will need to provide [place] with your name, name of your surgeon, type of surgery, and date of procedure or surgery.  *Opioid medications include: morphine, codeine, oxycodone, oxymorphone, hydrocodone, hydromorphone, meperidine, tramadol, tapentadol, buprenorphine, fentanyl, methadone. **Benzodiazepine medications include: diazepam (Valium), alprazolam (Xanax), clonazepam (Klonopine), lorazepam (Ativan), clorazepate (Tranxene), chlordiazepoxide (Librium), estazolam (Prosom), oxazepam (Serax), temazepam (Restoril), triazolam (Halcion) (Last updated: 10/29/2017) ____________________________________________________________________________________________  [facility] [phone]  Oxycodone hcl 5 mg x 3 months to begin filling on 07/30/18, 08/29/18, and 09/28/17 escribed to your pharmacy   Robaxin escribed to [place] out patient pharmacy   Cream escribed.

## 2018-07-14 NOTE — Patient Instructions (Addendum)
____________________________________________________________________________________________  Medication Rules  Applies to: All patients receiving prescriptions (written or electronic).  Pharmacy of record: Pharmacy where electronic prescriptions will be sent. If written prescriptions are taken to a different pharmacy, please inform the nursing staff. The pharmacy listed in the electronic medical record should be the one where you would like electronic prescriptions to be sent.  Prescription refills: Only during scheduled appointments. Applies to both, written and electronic prescriptions.  NOTE: The following applies primarily to controlled substances (Opioid* Pain Medications).   Patient's responsibilities: 1. Pain Pills: Bring all pain pills to every appointment (except for procedure appointments). 2. Pill Bottles: Bring pills in original pharmacy bottle. Always bring newest bottle. Bring bottle, even if empty. 3. Medication refills: You are responsible for knowing and keeping track of what medications you need refilled. The day before your appointment, write a list of all prescriptions that need to be refilled. Bring that list to your appointment and give it to the admitting nurse. Prescriptions will be written only during appointments. If you forget a medication, it will not be "Called in", "Faxed", or "electronically sent". You will need to get another appointment to get these prescribed. 4. Prescription Accuracy: You are responsible for carefully inspecting your prescriptions before leaving our office. Have the discharge nurse carefully go over each prescription with you, before taking them home. Make sure that your name is accurately spelled, that your address is correct. Check the name and dose of your medication to make sure it is accurate. Check the number of pills, and the written instructions to make sure they are clear and accurate. Make sure that you are given enough medication to last  until your next medication refill appointment. 5. Taking Medication: Take medication as prescribed. Never take more pills than instructed. Never take medication more frequently than prescribed. Taking less pills or less frequently is permitted and encouraged, when it comes to controlled substances (written prescriptions).  6. Inform other Doctors: Always inform, all of your healthcare providers, of all the medications you take. 7. Pain Medication from other Providers: You are not allowed to accept any additional pain medication from any other Doctor or Healthcare provider. There are two exceptions to this rule. (see below) In the event that you require additional pain medication, you are responsible for notifying us, as stated below. 8. Medication Agreement: You are responsible for carefully reading and following our Medication Agreement. This must be signed before receiving any prescriptions from our practice. Safely store a copy of your signed Agreement. Violations to the Agreement will result in no further prescriptions. (Additional copies of our Medication Agreement are available upon request.) 9. Laws, Rules, & Regulations: All patients are expected to follow all Federal and State Laws, Statutes, Rules, & Regulations. Ignorance of the Laws does not constitute a valid excuse. The use of any illegal substances is prohibited. 10. Adopted CDC guidelines & recommendations: Target dosing levels will be at or below 60 MME/day. Use of benzodiazepines** is not recommended.  Exceptions: There are only two exceptions to the rule of not receiving pain medications from other Healthcare Providers. 1. Exception #1 (Emergencies): In the event of an emergency (i.e.: accident requiring emergency care), you are allowed to receive additional pain medication. However, you are responsible for: As soon as you are able, call our office [phone], at any time of the day or night, and leave a message stating your name, the  date and nature of the emergency, and the name and dose of the medication   prescribed. In the event that your call is answered by a member of our staff, make sure to document and save the date, time, and the name of the person that took your information.  2. Exception #2 (Planned Surgery): In the event that you are scheduled by another doctor or dentist to have any type of surgery or procedure, you are allowed (for a period no longer than 30 days), to receive additional pain medication, for the acute post-op pain. However, in this case, you are responsible for picking up a copy of our "Post-op Pain Management for Surgeons" handout, and giving it to your surgeon or dentist. This document is available at our office, and does not require an appointment to obtain it. Simply go to our office during business hours (Monday-Thursday from 8:00 AM to 4:00 PM) (Friday 8:00 AM to 12:00 Noon) or if you have a scheduled appointment with [place], prior to your surgery, and ask for it by name. In addition, you will need to provide [place] with your name, name of your surgeon, type of surgery, and date of procedure or surgery.  *Opioid medications include: morphine, codeine, oxycodone, oxymorphone, hydrocodone, hydromorphone, meperidine, tramadol, tapentadol, buprenorphine, fentanyl, methadone. **Benzodiazepine medications include: diazepam (Valium), alprazolam (Xanax), clonazepam (Klonopine), lorazepam (Ativan), clorazepate (Tranxene), chlordiazepoxide (Librium), estazolam (Prosom), oxazepam (Serax), temazepam (Restoril), triazolam (Halcion) (Last updated: 10/29/2017) ____________________________________________________________________________________________  [facility] [phone]  Oxycodone hcl 5 mg x 3 months to begin filling on 07/30/18, 08/29/18, and 09/28/17 escribed to your pharmacy   Robaxin escribed to [place] out patient pharmacy   Cream escribed.

## 2018-07-16 ENCOUNTER — Other Ambulatory Visit: Payer: Self-pay | Admitting: Cardiology

## 2018-07-18 LAB — TOXASSURE SELECT 13 (MW), URINE

## 2018-07-23 ENCOUNTER — Other Ambulatory Visit: Payer: Medicare HMO | Admitting: *Deleted

## 2018-07-23 ENCOUNTER — Ambulatory Visit (INDEPENDENT_AMBULATORY_CARE_PROVIDER_SITE_OTHER): Payer: Medicare HMO | Admitting: Pharmacist

## 2018-07-23 DIAGNOSIS — I4891 Unspecified atrial fibrillation: Secondary | ICD-10-CM

## 2018-07-23 DIAGNOSIS — Z5181 Encounter for therapeutic drug level monitoring: Secondary | ICD-10-CM | POA: Diagnosis not present

## 2018-07-23 DIAGNOSIS — Z79899 Other long term (current) drug therapy: Secondary | ICD-10-CM

## 2018-07-23 LAB — BASIC METABOLIC PANEL
BUN / CREAT RATIO: 23 — AB (ref 9–20)
BUN: 21 mg/dL (ref 6–24)
CHLORIDE: 95 mmol/L — AB (ref 96–106)
CO2: 28 mmol/L (ref 20–29)
CREATININE: 0.92 mg/dL (ref 0.76–1.27)
Calcium: 8.1 mg/dL — ABNORMAL LOW (ref 8.7–10.2)
GFR calc Af Amer: 106 mL/min/{1.73_m2} (ref >59)
GFR calc non Af Amer: 92 mL/min/{1.73_m2} (ref >59)
GLUCOSE: 94 mg/dL (ref 65–99)
Potassium: 3.9 mmol/L (ref 3.5–5.2)
SODIUM: 134 mmol/L (ref 134–144)

## 2018-07-23 LAB — POCT INR: INR: 3.6 — AB (ref 2.0–3.0)

## 2018-07-23 NOTE — Patient Instructions (Signed)
Description   Eat extra greens today. Continue taking usual dose of 5 mg daily except 7.5 every Sunday, Tuesday and Thursday.  Recheck INR in 3 weeks.

## 2018-08-03 MED FILL — DIGOXIN 250 MCG TABLET: 250 | 30 days supply | Qty: 30 | Fill #5

## 2018-08-03 MED FILL — METHOCARBAMOL 500 MG TABLET: 500 | 90 days supply | Qty: 270 | Fill #0

## 2018-08-04 ENCOUNTER — Telehealth: Payer: Self-pay | Admitting: Nurse Practitioner

## 2018-08-04 NOTE — Telephone Encounter (Signed)
Resent  Please notify pt  thanks

## 2018-08-04 NOTE — Telephone Encounter (Addendum)
Linda Hedges from [facility] called for patient. Patient had called to check on his prescription for pain cream. Michela Pitcher it was supposed to have been faxed and they do not have a script for him.   Linda Hedges  FAX [phone]

## 2018-08-04 NOTE — Telephone Encounter (Signed)
refaxed script and lvmail for patient notifying him we sent script.

## 2018-08-12 ENCOUNTER — Ambulatory Visit (INDEPENDENT_AMBULATORY_CARE_PROVIDER_SITE_OTHER): Payer: Medicare HMO | Admitting: *Deleted

## 2018-08-12 DIAGNOSIS — I4891 Unspecified atrial fibrillation: Secondary | ICD-10-CM | POA: Diagnosis not present

## 2018-08-12 DIAGNOSIS — Z5181 Encounter for therapeutic drug level monitoring: Secondary | ICD-10-CM | POA: Diagnosis not present

## 2018-08-12 LAB — POCT INR: INR: 3.9 — AB (ref 2.0–3.0)

## 2018-08-12 NOTE — Patient Instructions (Signed)
Description   Do not take any Coumadin today then continue taking usual dose of 5 mg daily except 7.5 every Sunday, Tuesday and Thursday.  Please resume your normal green intake. Recheck INR in 3 weeks.

## 2018-08-16 ENCOUNTER — Ambulatory Visit: Payer: Medicare HMO | Admitting: Podiatry

## 2018-08-23 ENCOUNTER — Telehealth: Payer: Self-pay

## 2018-08-23 NOTE — Telephone Encounter (Signed)
Patient contacted the office requesting information on his Congestive Heart Failure.  He wished to know the "stage" he was in for what he stated was a clinical study.  Patient was seen back in 2016 by Dr. Roxy Manns who stated he had stage III heart failure according to his note on 12/28/2014.  Patient made aware and acknowledged receipt.

## 2018-09-01 ENCOUNTER — Telehealth: Payer: Self-pay | Admitting: Physician Assistant

## 2018-09-01 NOTE — Telephone Encounter (Signed)
58 yo male with hx of AVR with Bentall procedure in 1998 and mechanical MVR in 2016, CAD s/p CABG with S-RCA (S-RCA known to be occluded at last cath), diastolic CHF, AFib, prior CVA.  He called the answering service today with complaints of chest pain.  He had some pain after swallowing some toast wrong earlier today.  That pain subsided.  Since then he has developed chest pain that is a pressure and substernal.  He denies shortness of breath, radiating symptoms, nausea or diaphoresis.  He may feel worse when he moves about.  He has not taken anything for it.   PLAN:  I advised him to go to the ED.  He is not really interested in doing this.  I suggested he could take some Mylanta to see if it helps and if his symptoms continue, he would need to go to the ED.  He does not have any Mylanta.  He plans to see how he feels over the next several minutes.  If his symptoms do not subside, he will go to the ED. Richardson Dopp, PA-C    09/01/2018 3:14 PM

## 2018-09-02 ENCOUNTER — Ambulatory Visit (INDEPENDENT_AMBULATORY_CARE_PROVIDER_SITE_OTHER): Payer: Medicare HMO | Admitting: Pharmacist

## 2018-09-02 DIAGNOSIS — Z5181 Encounter for therapeutic drug level monitoring: Secondary | ICD-10-CM

## 2018-09-02 DIAGNOSIS — I4891 Unspecified atrial fibrillation: Secondary | ICD-10-CM

## 2018-09-02 LAB — POCT INR: INR: 5.6 — AB (ref 2.0–3.0)

## 2018-09-02 NOTE — Patient Instructions (Signed)
Do not take any Coumadin today or tomorrow, take 1/2 tablet on Saturday and then continue taking usual dose of 5 mg daily except 7.5 every Sunday, Tuesday and Thursday.  Please resume your normal green intake. Recheck INR in 2 weeks.

## 2018-09-06 ENCOUNTER — Encounter: Payer: Self-pay | Admitting: Podiatry

## 2018-09-06 ENCOUNTER — Ambulatory Visit: Payer: Medicare HMO | Admitting: Podiatry

## 2018-09-06 VITALS — BP 103/56

## 2018-09-06 DIAGNOSIS — I890 Lymphedema, not elsewhere classified: Secondary | ICD-10-CM | POA: Diagnosis not present

## 2018-09-06 DIAGNOSIS — E119 Type 2 diabetes mellitus without complications: Secondary | ICD-10-CM

## 2018-09-06 DIAGNOSIS — M79674 Pain in right toe(s): Secondary | ICD-10-CM

## 2018-09-06 DIAGNOSIS — Z9229 Personal history of other drug therapy: Secondary | ICD-10-CM | POA: Diagnosis not present

## 2018-09-06 DIAGNOSIS — M79675 Pain in left toe(s): Secondary | ICD-10-CM

## 2018-09-06 DIAGNOSIS — B351 Tinea unguium: Secondary | ICD-10-CM | POA: Diagnosis not present

## 2018-09-06 NOTE — Patient Instructions (Signed)
Diabetes Mellitus and Foot Care Foot care is an important part of your health, especially when you have diabetes. Diabetes may cause you to have problems because of poor blood flow (circulation) to your feet and legs, which can cause your skin to:  Become thinner and drier.  Break more easily.  Heal more slowly.  Peel and crack. You may also have nerve damage (neuropathy) in your legs and feet, causing decreased feeling in them. This means that you may not notice minor injuries to your feet that could lead to more serious problems. Noticing and addressing any potential problems early is the best way to prevent future foot problems. How to care for your feet Foot hygiene  Wash your feet daily with warm water and mild soap. Do not use hot water. Then, pat your feet and the areas between your toes until they are completely dry. Do not soak your feet as this can dry your skin.  Trim your toenails straight across. Do not dig under them or around the cuticle. File the edges of your nails with an emery board or nail file.  Apply a moisturizing lotion or petroleum jelly to the skin on your feet and to dry, brittle toenails. Use lotion that does not contain alcohol and is unscented. Do not apply lotion between your toes. Shoes and socks  Wear clean socks or stockings every day. Make sure they are not too tight. Do not wear knee-high stockings since they may decrease blood flow to your legs.  Wear shoes that fit properly and have enough cushioning. Always look in your shoes before you put them on to be sure there are no objects inside.  To break in new shoes, wear them for just a few hours a day. This prevents injuries on your feet. Wounds, scrapes, corns, and calluses  Check your feet daily for blisters, cuts, bruises, sores, and redness. If you cannot see the bottom of your feet, use a mirror or ask someone for help.  Do not cut corns or calluses or try to remove them with medicine.  If you  find a minor scrape, cut, or break in the skin on your feet, keep it and the skin around it clean and dry. You may clean these areas with mild soap and water. Do not clean the area with peroxide, alcohol, or iodine.  If you have a wound, scrape, corn, or callus on your foot, look at it several times a day to make sure it is healing and not infected. Check for: ? Redness, swelling, or pain. ? Fluid or blood. ? Warmth. ? Pus or a bad smell. General instructions  Do not cross your legs. This may decrease blood flow to your feet.  Do not use heating pads or hot water bottles on your feet. They may burn your skin. If you have lost feeling in your feet or legs, you may not know this is happening until it is too late.  Protect your feet from hot and cold by wearing shoes, such as at the beach or on hot pavement.  Schedule a complete foot exam at least once a year (annually) or more often if you have foot problems. If you have foot problems, report any cuts, sores, or bruises to your health care provider immediately. Contact a health care provider if:  You have a medical condition that increases your risk of infection and you have any cuts, sores, or bruises on your feet.  You have an injury that is not   healing.  You have redness on your legs or feet.  You feel burning or tingling in your legs or feet.  You have pain or cramps in your legs and feet.  Your legs or feet are numb.  Your feet always feel cold.  You have pain around a toenail. Get help right away if:  You have a wound, scrape, corn, or callus on your foot and: ? You have pain, swelling, or redness that gets worse. ? You have fluid or blood coming from the wound, scrape, corn, or callus. ? Your wound, scrape, corn, or callus feels warm to the touch. ? You have pus or a bad smell coming from the wound, scrape, corn, or callus. ? You have a fever. ? You have a red line going up your leg. Summary  Check your feet every day  for cuts, sores, red spots, swelling, and blisters.  Moisturize feet and legs daily.  Wear shoes that fit properly and have enough cushioning.  If you have foot problems, report any cuts, sores, or bruises to your health care provider immediately.  Schedule a complete foot exam at least once a year (annually) or more often if you have foot problems. This information is not intended to replace advice given to you by your health care provider. Make sure you discuss any questions you have with your health care provider. Document Released: 08/15/2000 Document Revised: 09/30/2017 Document Reviewed: 09/19/2016 [employer] Interactive Patient Education  2019 [employer]  Onychomycosis/Fungal Toenails  WHAT IS IT? An infection that lies within the keratin of your nail plate that is caused by a fungus.  WHY ME? Fungal infections affect all ages, sexes, races, and creeds.  There may be many factors that predispose you to a fungal infection such as age, coexisting medical conditions such as diabetes, or an autoimmune disease; stress, medications, fatigue, genetics, etc.  Bottom line: fungus thrives in a warm, moist environment and your shoes offer such a location.  IS IT CONTAGIOUS? Theoretically, yes.  You do not want to share shoes, nail clippers or files with someone who has fungal toenails.  Walking around barefoot in the same room or sleeping in the same bed is unlikely to transfer the organism.  It is important to realize, however, that fungus can spread easily from one nail to the next on the same foot.  HOW DO WE TREAT THIS?  There are several ways to treat this condition.  Treatment may depend on many factors such as age, medications, pregnancy, liver and kidney conditions, etc.  It is best to ask your doctor which options are available to you.  1. No treatment.   Unlike many other medical concerns, you can live with this condition.  However for many people this can be a painful condition and  may lead to ingrown toenails or a bacterial infection.  It is recommended that you keep the nails cut short to help reduce the amount of fungal nail. 2. Topical treatment.  These range from herbal remedies to prescription strength nail lacquers.  About 40-50% effective, topicals require twice daily application for approximately 9 to 12 months or until an entirely new nail has grown out.  The most effective topicals are medical grade medications available through physicians offices. 3. Oral antifungal medications.  With an 80-90% cure rate, the most common oral medication requires 3 to 4 months of therapy and stays in your system for a year as the new nail grows out.  Oral antifungal medications do require   blood work to make sure it is a safe drug for you.  A liver function panel will be performed prior to starting the medication and after the first month of treatment.  It is important to have the blood work performed to avoid any harmful side effects.  In general, this medication safe but blood work is required. 4. Laser Therapy.  This treatment is performed by applying a specialized laser to the affected nail plate.  This therapy is noninvasive, fast, and non-painful.  It is not covered by insurance and is therefore, out of pocket.  The results have been very good with a 80-95% cure rate.  [facility] is the only practice in the area to offer this therapy. 5. Permanent Nail Avulsion.  Removing the entire nail so that a new nail will not grow back. 

## 2018-09-07 MED FILL — DIGOXIN 250 MCG TABLET: 250 | 30 days supply | Qty: 30 | Fill #6

## 2018-09-12 ENCOUNTER — Other Ambulatory Visit: Payer: Self-pay | Admitting: Cardiology

## 2018-09-13 ENCOUNTER — Telehealth: Payer: Self-pay | Admitting: Cardiology

## 2018-09-13 NOTE — Telephone Encounter (Signed)
New message    *STAT* If patient is at the pharmacy, call can be transferred to refill team.   1. Which medications need to be refilled? (please list name of each medication and dose if known) Potassium   2. Which pharmacy/location (including street and city if local pharmacy) is medication to be sent to? [facility] [address]   3. Do they need a 30 day or 90 day supply? 90  Pt only has 1 or 2 days left of medication

## 2018-09-16 ENCOUNTER — Other Ambulatory Visit: Payer: Medicare HMO | Admitting: *Deleted

## 2018-09-16 ENCOUNTER — Other Ambulatory Visit: Payer: Self-pay | Admitting: *Deleted

## 2018-09-16 ENCOUNTER — Ambulatory Visit (INDEPENDENT_AMBULATORY_CARE_PROVIDER_SITE_OTHER): Payer: Medicare HMO | Admitting: Pharmacist

## 2018-09-16 DIAGNOSIS — I4891 Unspecified atrial fibrillation: Secondary | ICD-10-CM

## 2018-09-16 DIAGNOSIS — I5033 Acute on chronic diastolic (congestive) heart failure: Secondary | ICD-10-CM

## 2018-09-16 DIAGNOSIS — Z5181 Encounter for therapeutic drug level monitoring: Secondary | ICD-10-CM | POA: Diagnosis not present

## 2018-09-16 LAB — POCT INR: INR: 3.3 — AB (ref 2.0–3.0)

## 2018-09-16 NOTE — Patient Instructions (Signed)
Description   Continue taking usual dose of 1 tablet daily except 1.5 tablet every Sunday, Tuesday and Thursday. Continue normal green intake. Recheck INR in 3 weeks.

## 2018-09-17 LAB — BASIC METABOLIC PANEL
BUN/Creatinine Ratio: 28 — ABNORMAL HIGH (ref 9–20)
BUN: 27 mg/dL — ABNORMAL HIGH (ref 6–24)
CO2: 33 mmol/L — ABNORMAL HIGH (ref 20–29)
Calcium: 8.3 mg/dL — ABNORMAL LOW (ref 8.7–10.2)
Chloride: 89 mmol/L — ABNORMAL LOW (ref 96–106)
Creatinine, Ser: 0.98 mg/dL (ref 0.76–1.27)
GFR calc Af Amer: 99 mL/min/{1.73_m2} (ref >59)
GFR calc non Af Amer: 85 mL/min/{1.73_m2} (ref >59)
Glucose: 87 mg/dL (ref 65–99)
Potassium: 3.5 mmol/L (ref 3.5–5.2)
Sodium: 136 mmol/L (ref 134–144)

## 2018-09-17 LAB — MAGNESIUM: Magnesium: 2.3 mg/dL (ref 1.6–2.3)

## 2018-09-25 ENCOUNTER — Other Ambulatory Visit: Payer: Self-pay | Admitting: Cardiology

## 2018-09-27 ENCOUNTER — Other Ambulatory Visit: Payer: Self-pay | Admitting: Cardiology

## 2018-09-27 ENCOUNTER — Other Ambulatory Visit: Payer: Self-pay | Admitting: *Deleted

## 2018-09-27 MED ORDER — WARFARIN SODIUM 5 MG PO TABS: ORAL_TABLET | ORAL | 0 refills | Status: DC

## 2018-09-27 NOTE — Progress Notes (Signed)
Subjective: Lance Cook presents today referred by Tsosie Billing, MD with cc of painful, discolored, thick toenails which interfere with him wearing shoes comfortably.  Duration is about 1 month.  He denies any attempt any treatment for this.   Patient also states that he has cracked heel of the right foot.  He is unsure the onset of it.  Lance Cook also has lymphedema and he sees a vascular doctor for this.  He does not wear compression hose due to the fact that he says he cannot tolerate them he states his toes turn blue when he puts compression hose on.   Past Medical History:  Diagnosis Date  . Acute on chronic diastolic CHF (congestive heart failure) ([place]) 08/27/2016  . Anemia   . Anxiety   . ARDS (adult respiratory distress syndrome) ([place]) 01/27/2015  . Arthritis   . Ascending aortic dissection ([place]) 07/14/2008   Localized dissection of ascending aorta noted on CTA in 2009 and stable on CTA in 2011  . Asymptomatic chronic venous hypertension 01/15/2010   Overview:  Overview:  Qualifier: Diagnosis of  By: [name] MD, [name]   Last Assessment & Plan:  I suggested he go to a vein clinic to see if he could be qualified compression hose of some type, since he is not a surgical candidate according to the vascular surgeons.   . Atrial fibrillation ([place])    chronic persistent  . Bell's palsy   . C. difficile diarrhea   . CAD (coronary artery disease)    Old scar inferior wall myoview, 10/2009 EF 52%.  He did have previous SVG to RCA but no obstructive disease noted on his most recent cath.  SVG occluded.   . Cellulitis 04/02/2014  . Cerebral artery occlusion with cerebral infarction ([place]) 10/08/2011   Overview:  Overview:  And hx of TIA prior to CABG, all thought due to systemic emboli prior to coumadin   . Chronic diastolic heart failure ([place]) 03/01/2015   a. 12/2015: echo showing a preserved EF of 65-70%, moderate AS, and moderate TR.   Marland Kitchen Chronic LBP 10/08/2011  . CVA (cerebral  vascular accident) ([place]) 10/08/2011   And hx of TIA prior to CABG, all thought due to systemic emboli prior to coumadin   . DIABETES MELLITUS, TYPE II 11/01/2009   Qualifier: Diagnosis of  By: [name] MD, [name]    . Diverticulosis   . 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 Encephalopathy acute 02/05/2015  . Gastric polyps   . GERD (gastroesophageal reflux disease)    not needing medication at thhis time- 01/22/15  . Gout   . H/O mechanical aortic valve replacement 11/01/2009   Qualifier: Diagnosis of  By: [name], [name]    . Hiatal hernia   . History of colon polyps 12/23/2011   Overview:  Overview:  Colonoscopy January 2010, 6 mm rectal tubulovillous adenoma. No high-grade dysplasia   . HTN (hypertension) 02/23/2014  . Hyperlipidemia   . Hypertension   . Hypogonadism male 04/08/2012  . Impaired glucose tolerance 10/08/2011  . Morbid obesity ([place]) 04/02/2014  . Myocardial infarction [facility]) age 44  . OSA (obstructive sleep apnea)    CPAP  . Peripheral vascular disease ([name])   . 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 8/54/6270   33 mm St Jude bileaflet mechanical valve placed via right mini  thoracotomy approach  . 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    1988 - Dr Blase Mess at [facility] in [place], [place]  . Severe mitral regurgitation 10/11/2014  . Testicular cancer [facility])    He was 59 y/o. He had surgical resection and rad tx's.   Marland Kitchen TIA (transient ischemic attack)    age 69  . Tubulovillous adenoma of colon   . Varicose veins   . 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 Venous (peripheral) insufficiency 02/03/2005     Patient Active Problem List   Diagnosis Date Noted  . Benign neoplasm of ascending colon   . Benign neoplasm of rectum   . Benign neoplasm  of rectosigmoid junction   . Benign neoplasm of sigmoid colon   . DM type 2 with diabetic dyslipidemia ([place]) 04/26/2018  . Lower extremity edema 03/08/2018  . Other fatigue 01/28/2018  . Chronic anticoagulation 01/05/2018  . Insomnia 11/23/2017  . Unilateral vocal cord paralysis 11/30/2016  . Leg swelling 11/28/2016  . S/P MVR (mitral valve replacement)   . Acute on chronic diastolic CHF (congestive heart failure) ([place]) 08/27/2016  . Chronic pain syndrome 07/10/2016  . Chronic diarrhea   . Benign neoplasm of transverse colon   . Scrotal swelling 05/30/2016  . Musculoskeletal pain 01/07/2016  . Personal history of malignant neoplasm of testis 07/12/2015  . Long term current use of opiate analgesic 07/11/2015  . Long term prescription opiate use 07/11/2015  . Opiate use (22.5 MME/day) 07/11/2015  . Opiate dependence ([place]) 07/11/2015  . Encounter for therapeutic drug level monitoring 07/11/2015  . Chronic low back pain ([place]line) 07/11/2015  . Lumbar facet syndrome 07/11/2015  . Chronic lower extremity pain ([place]) 07/11/2015  . Adult BMI 30+ 06/19/2015  . Obesity (BMI 30-39.9) 06/19/2015  . Hypomagnesemia 02/08/2015  . Physical deconditioning 02/08/2015  . Enteritis due to Clostridium difficile 01/30/2015  . 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 70/48/8891  . Chronic diastolic congestive heart failure ([place])   . Type 2 diabetes mellitus with other specified complication ([place]) 69/45/0388  . 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 Venous insufficiency of both lower extremities 04/02/2014  . Morbid obesity ([place]) 04/02/2014  . Type II or unspecified type diabetes mellitus with other specified manifestations, not stated as uncontrolled 03/14/2014  . Personal history of transient ischemic attack (TIA), and cerebral infarction without residual deficits 02/23/2014  . Malignant neoplasm of testis ([place]) 02/23/2014  . H/O TIA  (transient ischemic attack) and stroke 02/23/2014  . HTN (hypertension) 02/23/2014  . Lymphadenopathy 02/23/2014  . S/P AVR (aortic valve replacement) 02/23/2014  . Encounter for therapeutic drug monitoring 02/02/2014  . Long term current use of anticoagulant therapy 07/15/2012  . Hypogonadism male 04/08/2012  . 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 Testicular hypofunction 04/08/2012  . Male hypogonadism 04/08/2012  . Other testicular hypofunction 04/08/2012  . History of colonic polyps 12/23/2011  . Testicular cancer ([place])   . TIA (transient ischemic attack)   . OSA (obstructive sleep apnea)   . Lumbar spinal stenosis ([place]vere L4-5) 10/08/2011  . ED (erectile dysfunction) of organic origin 10/08/2011  . Lumbar canal stenosis 10/08/2011  . Low back pain 10/08/2011  . Spinal stenosis of lumbar region without neurogenic claudication 10/08/2011  . CAD (coronary artery disease)   . Spondylosis 04/29/2010  . DYSLIPIDEMIA 01/15/2010  . Chronic venous hypertension with ulcer ([place]) 01/15/2010  .  Essential hypertension 12/14/2009  . Gout 11/01/2009  . Anemia, iron deficiency 11/01/2009  . MYOCARDIAL INFARCTION, HX OF 11/01/2009  . Atrial fibrillation, chronic 11/01/2009  . PERIPHERAL EDEMA 11/01/2009  . Heart valve replaced by other means 11/01/2009  . Presence of other heart-valve replacement 11/01/2009  . Ascending aortic dissection ([place]) 07/14/2008  . 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 Venous (peripheral) insufficiency 02/03/2005     Past Surgical History:  Procedure Laterality Date  . aortic truck    . BENTALL PROCEDURE  1988   25 mm St Jude mechanical valve conduit - Dr Blase Mess at [facility] in [place], [place]  . BIOPSY  06/01/2018   Procedure: BIOPSY;  Surgeon: Jerene Bears, MD;  Location: [facility] ENDOSCOPY;  Service: Gastroenterology;;  . CARDIAC VALVE REPLACEMENT     [phone]  . CHOLECYSTECTOMY  2011  . COLONOSCOPY WITH PROPOFOL N/A 06/17/2016    Procedure: COLONOSCOPY WITH PROPOFOL;  Surgeon: Jerene Bears, MD;  Location: [facility] ENDOSCOPY;  Service: Gastroenterology;  Laterality: N/A;  . COLONOSCOPY WITH PROPOFOL N/A 06/01/2018   Procedure: COLONOSCOPY WITH PROPOFOL;  Surgeon: Jerene Bears, MD;  Location: [facility] ENDOS[facility];  Service: Gastroenterology;  Laterality: N/A;  . CORONARY ARTERY BYPASS GRAFT    . LASER ABLATION Left 03/06/2010   leg  . MITRAL VALVE REPLACEMENT Right 01/24/2015   Procedure: Re-Operation, MINIMALLY INVASIVE MITRAL VALVE (MV) REPLACEMENT.;  Surgeon: Rexene Alberts, MD;  Location: [facility];  Service: Open Heart Surgery;  Laterality: Right;  . ORCHIECTOMY  age 55   testicular cancer  . OTOPLASTY     bilateral, age 9  . POLYPECTOMY  06/01/2018   Procedure: POLYPECTOMY;  Surgeon: Jerene Bears, MD;  Location: [facility] ENDOS[facility];  Service: Gastroenterology;;  . TEE WITHOUT CARDIOVERSION N/A 10/11/2014   Procedure: TRANSESOPHAGEAL ECHOCARDIOGRAM (TEE);  Surgeon: Pixie Casino, MD;  Location: [facility] ENDOSCOPY;  Service: Cardiovascular;  Laterality: N/A;  . TEE WITHOUT CARDIOVERSION N/A 01/24/2015   Procedure: TRANSESOPHAGEAL ECHOCARDIOGRAM (TEE);  Surgeon: Rexene Alberts, MD;  Location: [facility];  Service: Open Heart Surgery;  Laterality: N/A;  . TONSILLECTOMY  1967  . TOOTH EXTRACTION  2016      Current Outpatient Medications:  .  acetaminophen (TYLENOL) 500 MG tablet, Take 500-1,000 mg by mouth daily as needed for moderate pain or headache. , Disp: , Rfl:  .  allopurinol (ZYLOPRIM) 300 MG tablet, Take 300 mg by mouth daily., Disp: , Rfl:  .  atenolol (TENORMIN) 100 MG tablet, Take 25 mg by mouth 2 (two) times daily., Disp: , Rfl:  .  digoxin (LANOXIN) 0.25 MG tablet, Take 1 tablet (250 mcg total) by mouth daily., Disp: 90 tablet, Rfl: 2 .  enoxaparin (LOVENOX) 100 MG/ML injection, Inject 100 mg into the skin 2 (two) times daily., Disp: , Rfl:  .  furosemide (LASIX) 80 MG tablet, TAKE 1 TABLET TWICE DAILY, Disp: 180 tablet, Rfl: 0 .   Homeopathic Products (SIMILASAN PINK EYE RELIEF) SOLN, Place 1 drop into both eyes daily as needed (irritation)., Disp: , Rfl:  .  magnesium oxide (MAG-OX) 400 (241.3 MG) MG tablet, Take 1 tablet (400 mg total) by mouth 2 (two) times daily., Disp: 180 tablet, Rfl: 3 .  methocarbamol (ROBAXIN) 500 MG tablet, Take 1 tablet (500 mg total) by mouth 3 (three) times daily., Disp: 270 tablet, Rfl: 0 .  metolazone (ZAROXOLYN) 5 MG tablet, Take 1 tablet (5 mg total) by mouth once a week., Disp: 14 tablet,  Rfl: 3 .  mupirocin ointment (BACTROBAN) 2 %, Apply 1 application topically 2 (two) times daily as needed (infection prevention). , Disp: , Rfl:  .  NON FORMULARY, Apply 2 Pump topically 3 (three) times daily. LIDO5/DICLO3/CYCLO2/GABA 2, Disp: , Rfl:  .  [START ON 09/28/2018] oxyCODONE (OXY IR/ROXICODONE) 5 MG immediate release tablet, Take 1 tablet (5 mg total) by mouth every 8 (eight) hours as needed for severe pain., Disp: 90 tablet, Rfl: 0 .  oxyCODONE (OXY IR/ROXICODONE) 5 MG immediate release tablet, Take 1 tablet (5 mg total) by mouth every 8 (eight) hours as needed for moderate pain or severe pain., Disp: 90 tablet, Rfl: 0 .  Potassium 99 MG TABS, Take 99-198 mg by mouth daily as needed (cramping)., Disp: , Rfl:  .  traZODone (DESYREL) 50 MG tablet, Take 100 mg by mouth at bedtime. , Disp: , Rfl:  .  oxyCODONE (OXY IR/ROXICODONE) 5 MG immediate release tablet, Take 1 tablet (5 mg total) by mouth every 8 (eight) hours as needed for severe pain., Disp: 90 tablet, Rfl: 0 .  potassium chloride (K-DUR) 10 MEQ tablet, TAKE 2 TABLETS BY MOUTH TWICE DAILY, Disp: 360 tablet, Rfl: 2 .  warfarin (COUMADIN) 5 MG tablet, TAKE 1 TO 1 AND 1/2 TABLETS DAILY AS DIRECTED BY COUMADIN CLINIC, Disp: 10 tablet, Rfl: 0   Allergies  Allergen Reactions  . Penicillins Hives, Rash and Other (See Comments)    Has patient had a PCN reaction causing immediate rash, facial/tongue/throat swelling, SOB or lightheadedness with  hypotension: Yes Has patient had a PCN reaction causing severe rash involving mucus membranes or skin necrosis: Yes Has patient had a PCN reaction that required hospitalization unsure Has patient had a PCN reaction occurring within the last 10 years: yes If all of the above answers are "NO", then may proceed with Cephalosporin use.  . Adhesive [Tape] Other (See Comments)    Shin irritation  . Bactrim [Sulfamethoxazole-Trimethoprim] Other (See Comments)    CDIF, when taking oral tablets     Social History   Occupational History  . Occupation: Disabled Environmental education officer: UNEMPLOYED  Tobacco Use  . Smoking status: Former Smoker    Types: Cigars  . Smokeless tobacco: Never Used  . Tobacco comment: about 3 yearly- cigar  Substance and Sexual Activity  . Alcohol use: Yes    Alcohol/week: 0.0 standard drinks    Comment: social  . Drug use: No  . Sexual activity: Not on file     Family History  Problem Relation Age of Onset  . Heart disease Mother   . Throat cancer Mother   . Other Mother        bowel obstruction  . Diabetes Father   . Stroke Other   . Hypertension Other   . Cancer Maternal Aunt        lung, brain  . Cancer Maternal Uncle        brain aneurysm  . Colon cancer Neg Hx      Immunization History  Administered Date(s) Administered  . Influenza Whole 07/23/2010     Review of systems: Positive Findings in bold print.  Constitutional:  chills, fatigue, fever, sweats, weight change Communication: Optometrist, sign Ecologist, hand writing, iPad/Android device Head: headaches, head injury Eyes: changes in vision, eye pain, glaucoma, cataracts, macular degeneration, diplopia, glare,  light sensitivity, eyeglasses or contacts, blindness Ears nose mouth throat: Hard of hearing, ringing in ears, deaf, sign language,  vertigo,   nosebleeds,  rhinitis,  cold sores, snoring, swollen glands Cardiovascular: HTN, edema, arrhythmia, pacemaker in place,  defibrillator in place,  chest pain/tightness, chronic anticoagulation, blood clot, heart failure Peripheral Vascular: leg cramps, varicose veins, blood clots, lymphedema Respiratory:  difficulty breathing, denies congestion, SOB, wheezing, cough, emphysema Gastrointestinal: change in appetite or weight, abdominal pain, constipation, diarrhea, nausea, vomiting, vomiting blood, change in bowel habits, abdominal pain, jaundice, rectal bleeding, hemorrhoids, Genitourinary:  nocturia,  pain on urination,  blood in urine, Foley catheter, urinary urgency Musculoskeletal: uses mobility aid,  cramping, stiff joints, painful joints, decreased joint motion, fractures, OA, gout Skin: +changes in toenails, color change, dryness, itching, mole changes,  rash  Neurological: headaches, numbness in feet, paresthesias in feet, burning in feet, fainting,  seizures, change in speech. denies headaches, memory problems/poor historian, cerebral palsy, weakness, paralysis Endocrine: diabetes, hypothyroidism, hyperthyroidism,  goiter, dry mouth, flushing, heat intolerance,  cold intolerance,  excessive thirst, denies polyuria,  nocturia Hematological:  easy bleeding, excessive bleeding, easy bruising, enlarged lymph nodes, on long term blood thinner, history of past transusions Allergy/immunological:  hives, eczema, frequent infections, multiple drug allergies, seasonal allergies, transplant recipient Psychiatric:  anxiety, depression, mood disorder, suicidal ideations, hallucinations   Objective: Vascular Examination: Capillary refill time immediate x 10 digits Dorsalis pedis faintly palpable bilaterally posterior tibial pulses nonpalpable b/l No digital hair x 10 digits Skin temperature gradient WNL b/l Lymphedema noted bilateral lower legs.  There is a mild amount of weeping of clear fluid.  Dermatological Examination: Lymphedema as described above bilaterally  Toenails 1-5 b/l discolored, thick, dystrophic with  subungual debris and pain with palpation to nailbeds due to thickness of nails.  Musculoskeletal: Muscle strength 5/5 to all LE muscle groups  Neurological: Sensation intact with 10 gram monofilament Vibratory sensation intact.  Assessment: 1. Painful onychomycosis toenails 1-5 b/l  2. Patient on long-term anticoagulants 3. NIDDM   Plan: 1. Discussed onychomycosis and treatment options.  Literature dispensed on today. 2. We discussed his edema.  We discussed various treatment  modalities which included stockings, pumps, and leg wraps.  Patient states he cannot tolerate either of these modalities.  He is to continue following up with vascular for his lymphedema as he cannot tolerate any of the above. 3. Toenails 1-5 b/l were debrided in length and girth without iatrogenic bleeding. 4. Patient to continue soft, supportive shoe gear 5. Patient to report any pedal injuries to medical professional immediately. 6. Follow up 3 months. Patient/POA to call should there be a concern in the interim.

## 2018-09-27 NOTE — Telephone Encounter (Signed)
Returned a call to the pt and he stated he need an emergency supply sent to a local pharmacy. Pt is aware that his refill for his Mail Order was sent earlier this morning. Pt states he may have to work out a Presenter, broadcasting with the pharmacy as he is limited on funds. Pt will update [place] if he cannot pick up today.

## 2018-09-27 NOTE — Telephone Encounter (Signed)
Please review for refill. Thanks!  

## 2018-10-01 ENCOUNTER — Telehealth: Payer: Self-pay | Admitting: Cardiology

## 2018-10-01 NOTE — Telephone Encounter (Signed)
New Message   Patient missed a dose and wants to know what he should do.

## 2018-10-01 NOTE — Telephone Encounter (Signed)
Called spoke with pt, he ran out of Coumadin last pm, has new bottle/supply now. Missed 1.5 tablets yesterday, INR was 3.3 on 09/16/18, advised pt to take 1.5 tablets today (normal dosage would be 1 tablet) then resume previous dosage regimen. Pt verbalized understanding.

## 2018-10-07 ENCOUNTER — Ambulatory Visit (INDEPENDENT_AMBULATORY_CARE_PROVIDER_SITE_OTHER): Payer: Medicare HMO | Admitting: Pharmacist

## 2018-10-07 DIAGNOSIS — I4891 Unspecified atrial fibrillation: Secondary | ICD-10-CM

## 2018-10-07 DIAGNOSIS — Z5181 Encounter for therapeutic drug level monitoring: Secondary | ICD-10-CM

## 2018-10-07 LAB — POCT INR: INR: 3.5 — AB (ref 2.0–3.0)

## 2018-10-07 NOTE — Patient Instructions (Signed)
Description   Continue taking usual dose of 1 tablet daily except 1.5 tablet every Sunday, Tuesday and Thursday. Continue normal green intake. Recheck INR in 4 weeks.

## 2018-10-11 ENCOUNTER — Encounter: Payer: Self-pay | Admitting: Nurse Practitioner

## 2018-10-12 MED FILL — DIGOXIN 250 MCG TABLET: 250 | 30 days supply | Qty: 30 | Fill #7

## 2018-10-20 ENCOUNTER — Other Ambulatory Visit: Payer: Self-pay

## 2018-10-20 DIAGNOSIS — I739 Peripheral vascular disease, unspecified: Secondary | ICD-10-CM

## 2018-10-26 ENCOUNTER — Inpatient Hospital Stay (HOSPITAL_COMMUNITY): Admission: RE | Admit: 2018-10-26 | Payer: Self-pay | Source: Ambulatory Visit

## 2018-10-26 ENCOUNTER — Ambulatory Visit: Payer: Self-pay | Admitting: Vascular Surgery

## 2018-11-02 ENCOUNTER — Other Ambulatory Visit: Payer: Self-pay | Admitting: Nurse Practitioner

## 2018-11-02 DIAGNOSIS — M7918 Myalgia, other site: Secondary | ICD-10-CM

## 2018-11-04 ENCOUNTER — Ambulatory Visit (INDEPENDENT_AMBULATORY_CARE_PROVIDER_SITE_OTHER): Payer: Medicare HMO | Admitting: Pharmacist

## 2018-11-04 DIAGNOSIS — Z5181 Encounter for therapeutic drug level monitoring: Secondary | ICD-10-CM | POA: Diagnosis not present

## 2018-11-04 DIAGNOSIS — I4891 Unspecified atrial fibrillation: Secondary | ICD-10-CM

## 2018-11-04 DIAGNOSIS — Z954 Presence of other heart-valve replacement: Secondary | ICD-10-CM

## 2018-11-04 LAB — POCT INR: INR: 2 (ref 2.0–3.0)

## 2018-11-04 NOTE — Patient Instructions (Signed)
Description   Take two tablets tonight, and 1.5 tablets tomorrow, then continue taking usual dose of 1 tablet daily except 1.5 tablet every Sunday, Tuesday and Thursday. Continue normal green intake. Recheck INR in 2 weeks.

## 2018-11-11 ENCOUNTER — Other Ambulatory Visit: Payer: Self-pay

## 2018-11-11 MED ORDER — POTASSIUM CHLORIDE ER 10 MEQ PO TBCR: 20.0000 meq | EXTENDED_RELEASE_TABLET | Freq: Two times a day (BID) | ORAL | 2 refills | Status: DC

## 2018-11-11 MED ORDER — ATENOLOL 100 MG PO TABS: ORAL_TABLET | ORAL | 2 refills | Status: DC

## 2018-11-11 MED ORDER — WARFARIN SODIUM 5 MG PO TABS: ORAL_TABLET | ORAL | 0 refills | Status: DC

## 2018-11-11 MED ORDER — FUROSEMIDE 80 MG PO TABS: 80.0000 mg | ORAL_TABLET | Freq: Every day | ORAL | 2 refills | Status: DC

## 2018-11-11 MED ORDER — DIGOXIN 250 MCG PO TABS: 250.0000 ug | ORAL_TABLET | Freq: Every day | ORAL | 2 refills | Status: DC

## 2018-11-11 NOTE — Telephone Encounter (Signed)
Rx(s) sent to pharmacy electronically.  

## 2018-11-11 NOTE — Addendum Note (Signed)
Addended by: Diana Eves on: 11/11/2018 02:10 PM   Modules accepted: Orders

## 2018-11-18 ENCOUNTER — Ambulatory Visit (INDEPENDENT_AMBULATORY_CARE_PROVIDER_SITE_OTHER): Payer: Medicare HMO | Admitting: Pharmacist

## 2018-11-18 ENCOUNTER — Other Ambulatory Visit: Payer: Self-pay | Admitting: *Deleted

## 2018-11-18 ENCOUNTER — Other Ambulatory Visit: Payer: Self-pay

## 2018-11-18 DIAGNOSIS — Z954 Presence of other heart-valve replacement: Secondary | ICD-10-CM

## 2018-11-18 DIAGNOSIS — I4891 Unspecified atrial fibrillation: Secondary | ICD-10-CM | POA: Diagnosis not present

## 2018-11-18 DIAGNOSIS — Z5181 Encounter for therapeutic drug level monitoring: Secondary | ICD-10-CM

## 2018-11-18 LAB — POCT INR: INR: 2.5 (ref 2.0–3.0)

## 2018-11-18 MED ORDER — ATENOLOL 100 MG PO TABS: ORAL_TABLET | ORAL | 2 refills | Status: DC

## 2018-11-18 MED ORDER — DIGOXIN 250 MCG PO TABS: 250.0000 ug | ORAL_TABLET | Freq: Every day | ORAL | 2 refills | Status: DC

## 2018-11-18 MED ORDER — FUROSEMIDE 80 MG PO TABS: 80.0000 mg | ORAL_TABLET | Freq: Every day | ORAL | 2 refills | Status: DC

## 2018-11-18 MED ORDER — POTASSIUM CHLORIDE ER 10 MEQ PO TBCR: 20.0000 meq | EXTENDED_RELEASE_TABLET | Freq: Two times a day (BID) | ORAL | 2 refills | Status: DC

## 2018-11-18 NOTE — Patient Instructions (Signed)
Continue taking 1.5 tablet every Sunday, Tuesday and Thursday. Continue normal green intake. Recheck INR in 6 weeks.

## 2018-11-23 ENCOUNTER — Telehealth: Payer: Self-pay | Admitting: Cardiology

## 2018-11-23 NOTE — Telephone Encounter (Signed)
Patient would like to speak to provider about some electrolyte issue he has been having, and if the doctor would like to change any of his medications.

## 2018-11-23 NOTE — Telephone Encounter (Signed)
Spoke with pt for lengthy amount of time. Pt has a long list of complaints electrolytes are  up and down,feels like can hold fluid one day and next feels dehydrated. Also feels like at times may have small amount of wheezing, no energy (pt has to take frequent breaks with walking in 1200 square foot home), and weight fluctuates between 189-198 Per pt watches salt intake and is currently taking Furosemide 80 mg qd and sometimes  will take 40 mg bid if feels dehydrated Also has Metolazone 5 mg 1 x week .Per pt can take Metolazone when weight is up and will eliminate but may take as much as 36 hours.Also  B/P is running kind of low ex 105/70. Will forward to Dr [name] for review and recommendations./cy

## 2018-11-23 NOTE — Telephone Encounter (Signed)
We will set him up for either a phone or video visit.  In the next 1 - 2 days.

## 2018-11-24 ENCOUNTER — Encounter: Payer: Self-pay | Admitting: Cardiology

## 2018-11-24 ENCOUNTER — Telehealth (INDEPENDENT_AMBULATORY_CARE_PROVIDER_SITE_OTHER): Payer: Medicare HMO | Admitting: Cardiology

## 2018-11-24 VITALS — BP 142/82 | HR 73 | Temp 98.2°F | Ht 61.0 in | Wt 199.6 lb

## 2018-11-24 DIAGNOSIS — Z954 Presence of other heart-valve replacement: Secondary | ICD-10-CM | POA: Diagnosis not present

## 2018-11-24 DIAGNOSIS — I4891 Unspecified atrial fibrillation: Secondary | ICD-10-CM | POA: Diagnosis not present

## 2018-11-24 DIAGNOSIS — I5033 Acute on chronic diastolic (congestive) heart failure: Secondary | ICD-10-CM

## 2018-11-24 DIAGNOSIS — Z952 Presence of prosthetic heart valve: Secondary | ICD-10-CM

## 2018-11-24 NOTE — Progress Notes (Signed)
Virtual Visit via Video Note    Evaluation Performed:  Follow-up visit  This visit type was conducted due to national recommendations for restrictions regarding the COVID-19 Pandemic (e.g. social distancing).  This format is felt to be most appropriate for this patient at this time.  All issues noted in this document were discussed and addressed.  No physical exam was performed (except for noted visual exam findings with Video Visits).  Please refer to the patient's chart (MyChart message for video visits and phone note for telephone visits) for the patient's consent to telehealth for [facility].  Date:  11/24/2018   ID:  [name], DOB [birth year], MRN [id number]  Patient Location:  [place] [address]   Provider location:   [facility]  PCP:  Tsosie Billing, MD  Cardiologist:  [name], MD  Electrophysiologist:  None   Chief Complaint:  Edema and SOB  History of Present Illness:    Scout Gumbs is a 58 y.o. male who presents via audio/video conferencing for a telehealth visit today.   He has a history of valvular heart disease.   He has a past medical history of AVR (s/p Bentall procedure in 1988), severe mitral regurgitation (s/p St Jude mechanical valve placed in 12/2014), CAD (s/p SVG-RCA, known to be occluded by cath in 12/2014), chronic atrial fibrillation, chronic diastolic CHF (EF 06-26% by echo in 12/2015), prior CVA, chronic venous insufficiency, OSA (on CPAP)and recurrent C. difficile infection.     He presents with increased edema by his report and had some increased shortness of breath.  He says that his weight today is 198 pounds.  He usually takes his Zaroxolyn once weekly on Wednesday.  He says he usually gets his weight down when this is administered.  He has some dry non healing ulcers.   He did have an episode in which he fell recently when he got out of bed.  He was light headed.  He did not have syncope.    The patient does not  symptoms concerning for COVID-19 infection (fever, chills, cough, or new SHORTNESS OF BREATH).   Prior CV studies:   The following studies were reviewed today:  None  Past Medical History:  Diagnosis Date  . Acute on chronic diastolic CHF (congestive heart failure) ([place]) 08/27/2016  . Anemia   . Anxiety   . ARDS (adult respiratory distress syndrome) ([place]) 01/27/2015  . Arthritis   . Ascending aortic dissection ([place]) 07/14/2008   Localized dissection of ascending aorta noted on CTA in 2009 and stable on CTA in 2011  . Asymptomatic chronic venous hypertension 01/15/2010   Overview:  Overview:  Qualifier: Diagnosis of  By: [name] MD, [name]   Last Assessment & Plan:  I suggested he go to a vein clinic to see if he could be qualified compression hose of some type, since he is not a surgical candidate according to the vascular surgeons.   . Atrial fibrillation ([place])    chronic persistent  . Bell's palsy   . C. difficile diarrhea   . CAD (coronary artery disease)    Old scar inferior wall myoview, 10/2009 EF 52%.  He did have previous SVG to RCA but no obstructive disease noted on his most recent cath.  SVG occluded.   . Cellulitis 04/02/2014  . Cerebral artery occlusion with cerebral infarction ([place]) 10/08/2011   Overview:  Overview:  And hx of TIA prior to CABG, all thought due to systemic emboli prior to  coumadin   . Chronic diastolic heart failure ([place]) 03/01/2015   a. 12/2015: echo showing a preserved EF of 65-70%, moderate AS, and moderate TR.   Marland Kitchen Chronic LBP 10/08/2011  . CVA (cerebral vascular accident) ([place]) 10/08/2011   And hx of TIA prior to CABG, all thought due to systemic emboli prior to coumadin   . DIABETES MELLITUS, TYPE II 11/01/2009   Qualifier: Diagnosis of  By: [name] MD, [name]    . Diverticulosis   . ED (erectile dysfunction) of organic origin 10/08/2011   Overview:  Last Assessment & Plan:  S/p unilateral orchiectomy, for testosterone level, consider androgel pump  1.62    . Encephalopathy acute 02/05/2015  . Gastric polyps   . GERD (gastroesophageal reflux disease)    not needing medication at thhis time- 01/22/15  . Gout   . H/O mechanical aortic valve replacement 11/01/2009   Qualifier: Diagnosis of  By: [name], [name]    . Hiatal hernia   . History of colon polyps 12/23/2011   Overview:  Overview:  Colonoscopy January 2010, 6 mm rectal tubulovillous adenoma. No high-grade dysplasia   . HTN (hypertension) 02/23/2014  . Hyperlipidemia   . Hypertension   . Hypogonadism male 04/08/2012  . Impaired glucose tolerance 10/08/2011  . Morbid obesity ([place]) 04/02/2014  . Myocardial infarction [facility]) age 38  . OSA (obstructive sleep apnea)    CPAP  . Peripheral vascular disease ([place])   . 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 7/86/7672   33 mm St Jude bileaflet mechanical valve placed via right mini thoracotomy approach  . 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    1988 - Dr Blase Mess at [facility] in [place], [place]  . Severe mitral regurgitation 10/11/2014  . Testicular cancer [facility]    He was 58 y/o. He had surgical resection and rad tx's.   Marland Kitchen TIA (transient ischemic attack)    age 62  . Tubulovillous adenoma of colon   . Varicose veins   . 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 Venous (peripheral) insufficiency 02/03/2005   Past Surgical History:  Procedure Laterality Date  . aortic truck    . BENTALL PROCEDURE  1988   25 mm St Jude mechanical valve conduit - Dr Blase Mess at [facility] in [place], [place]  . BIOPSY  06/01/2018   Procedure: BIOPSY;  Surgeon: Jerene Bears, MD;  Location: [facility];  Service: Gastroenterology;;  . CARDIAC VALVE REPLACEMENT     [phone]  . CHOLECYSTECTOMY  2011  . COLONOSCOPY WITH PROPOFOL N/A 06/17/2016   Procedure: COLONOSCOPY WITH PROPOFOL;  Surgeon: Jerene Bears, MD;  Location: [facility];  Service:  Gastroenterology;  Laterality: N/A;  . COLONOSCOPY WITH PROPOFOL N/A 06/01/2018   Procedure: COLONOSCOPY WITH PROPOFOL;  Surgeon: Jerene Bears, MD;  Location: [facility];  Service: Gastroenterology;  Laterality: N/A;  . CORONARY ARTERY BYPASS GRAFT    . LASER ABLATION Left 03/06/2010   leg  . MITRAL VALVE REPLACEMENT Right 01/24/2015   Procedure: Re-Operation, MINIMALLY INVASIVE MITRAL VALVE (MV) REPLACEMENT.;  Surgeon: Rexene Alberts, MD;  Location: [place];  Service: Open Heart Surgery;  Laterality: Right;  . ORCHIECTOMY  age 76   testicular cancer  . OTOPLASTY     bilateral, age 69  . POLYPECTOMY  06/01/2018   Procedure: POLYPECTOMY;  Surgeon: Jerene Bears, MD;  Location: [facility];  Service: Gastroenterology;;  . TEE WITHOUT  CARDIOVERSION N/A 10/11/2014   Procedure: TRANSESOPHAGEAL ECHOCARDIOGRAM (TEE);  Surgeon: Pixie Casino, MD;  Location: [facility]COPY;  Service: Cardiovascular;  Laterality: N/A;  . TEE WITHOUT CARDIOVERSION N/A 01/24/2015   Procedure: TRANSESOPHAGEAL ECHOCARDIOGRAM (TEE);  Surgeon: Rexene Alberts, MD;  Location: [place];  Service: Open Heart Surgery;  Laterality: N/A;  . TONSILLECTOMY  1967  . TOOTH EXTRACTION  2016     No outpatient medications have been marked as taking for the 11/24/18 encounter (Telemedicine) with [name], MD.     Allergies:   Penicillins; Adhesive [tape]; and Bactrim [sulfamethoxazole-trimethoprim]   Social History   Tobacco Use  . Smoking status: Former Smoker    Types: Cigars  . Smokeless tobacco: Never Used  . Tobacco comment: about 3 yearly- cigar  Substance Use Topics  . Alcohol use: Yes    Alcohol/week: 0.0 standard drinks    Comment: social  . Drug use: No     Family Hx: The patient's family history includes Cancer in his maternal aunt and maternal uncle; Diabetes in his father; Heart disease in his mother; Hypertension in an other family member; Other in his mother; Stroke in an other family member; Throat  cancer in his mother. There is no history of Colon cancer.  ROS:   Please see the history of present illness.     All other systems reviewed and are negative.   Labs/Other Tests and Data Reviewed:    Recent Labs: 01/14/2018: ALT 16; B Natriuretic Peptide 142.5; Hemoglobin 11.3; Platelets 241 01/28/2018: TSH 2.890 09/16/2018: BUN 27; Creatinine, Ser 0.98; Magnesium 2.3; Potassium 3.5; Sodium 136   Recent Lipid Panel Lab Results  Component Value Date/Time   CHOL 118 08/18/2012 11:37 AM   TRIG 133.0 08/18/2012 11:37 AM   HDL 27.20 (L) 08/18/2012 11:37 AM   CHOLHDL 4 08/18/2012 11:37 AM   LDLCALC 64 08/18/2012 11:37 AM    Wt Readings from Last 3 Encounters:  11/24/18 199 lb 9.6 oz (90.5 kg)  07/14/18 200 lb (90.7 kg)  06/18/18 202 lb (91.6 kg)     Exam:    Vital Signs:  Temp 98.2 F (36.8 C)   Ht 5\' 1"  (1.549 m)   Wt 199 lb 9.6 oz (90.5 kg)   BMI 37.71 kg/m    Well nourished, well developed male in no acute distress.  Legs moderate to severe edema as assessed by video exam   ASSESSMENT & PLAN:    MVR:  No reason to suspect that this is an issue.  No change in therapy.  No imaging indicated at this point.  AVR:   Again this was stable on echo in June of last year.   EDEMA:    He and I discussed at length the need for increased Zaroxolyn.  I am gong to have him take this today and again on Sunday and to take an extra 40 meq daily of potassium with this Zaroxolyn.  He will call next week to let us know how his weight is and how he is breathing.  He might needs labs.   ATRIAL FIB:  The patient  tolerates this rhythm and rate control and anticoagulation. We will continue with the meds as listed.  COVID-19 Education: The signs and symptoms of COVID-19 were discussed with the patient and how to seek care for testing (follow up with PCP or arrange E-visit).  The importance of social distancing was discussed today.  Patient Risk:   After full review of this patients  clinical status, I feel that they are at least moderate risk at this time.  Time:   Today, I have spent 25 minutes with the patient with telehealth technology discussing.     Medication Adjustments/Labs and Tests Ordered: Current medicines are reviewed at length with the patient today.  Concerns regarding medicines are outlined above.  Tests Ordered: No orders of the defined types were placed in this encounter.  Medication Changes: No orders of the defined types were placed in this encounter.   Disposition:  He will call us in one week.    Signed, [name], MD  11/24/2018 1:07 PM    [place]

## 2018-11-25 NOTE — Telephone Encounter (Signed)
Video visit done 11/24/2018

## 2018-11-27 ENCOUNTER — Encounter: Payer: Self-pay | Admitting: Cardiology

## 2018-11-30 ENCOUNTER — Encounter (HOSPITAL_COMMUNITY): Payer: Self-pay

## 2018-11-30 ENCOUNTER — Ambulatory Visit: Payer: Self-pay | Admitting: Vascular Surgery

## 2018-12-05 ENCOUNTER — Encounter: Payer: Self-pay | Admitting: Podiatry

## 2018-12-06 ENCOUNTER — Ambulatory Visit: Payer: Medicare HMO | Admitting: Podiatry

## 2018-12-24 ENCOUNTER — Ambulatory Visit (INDEPENDENT_AMBULATORY_CARE_PROVIDER_SITE_OTHER): Payer: Medicare HMO | Admitting: Cardiovascular Disease

## 2018-12-24 DIAGNOSIS — Z7901 Long term (current) use of anticoagulants: Secondary | ICD-10-CM

## 2018-12-24 DIAGNOSIS — Z5181 Encounter for therapeutic drug level monitoring: Secondary | ICD-10-CM

## 2018-12-24 DIAGNOSIS — Z954 Presence of other heart-valve replacement: Secondary | ICD-10-CM

## 2018-12-24 LAB — POCT INR: INR: 8 — AB (ref 2.0–3.0)

## 2018-12-27 ENCOUNTER — Ambulatory Visit (INDEPENDENT_AMBULATORY_CARE_PROVIDER_SITE_OTHER): Payer: Medicare HMO | Admitting: Pharmacist

## 2018-12-27 DIAGNOSIS — Z5181 Encounter for therapeutic drug level monitoring: Secondary | ICD-10-CM | POA: Diagnosis not present

## 2018-12-27 DIAGNOSIS — Z954 Presence of other heart-valve replacement: Secondary | ICD-10-CM

## 2018-12-27 LAB — POCT INR: INR: 1.7 — AB (ref 2.0–3.0)

## 2018-12-29 ENCOUNTER — Telehealth: Payer: Self-pay

## 2018-12-29 NOTE — Telephone Encounter (Signed)
Unable to lmom voicemail full

## 2018-12-31 ENCOUNTER — Telehealth: Payer: Self-pay | Admitting: Cardiology

## 2018-12-31 ENCOUNTER — Ambulatory Visit (INDEPENDENT_AMBULATORY_CARE_PROVIDER_SITE_OTHER): Payer: Medicare HMO | Admitting: Pharmacist

## 2018-12-31 DIAGNOSIS — Z5181 Encounter for therapeutic drug level monitoring: Secondary | ICD-10-CM | POA: Diagnosis not present

## 2018-12-31 DIAGNOSIS — Z954 Presence of other heart-valve replacement: Secondary | ICD-10-CM | POA: Diagnosis not present

## 2018-12-31 LAB — POCT INR: INR: 1.6 — AB (ref 2.0–3.0)

## 2018-12-31 NOTE — Telephone Encounter (Signed)
Sure. Thanks 

## 2018-12-31 NOTE — Telephone Encounter (Signed)
New message   Amy ([facility]) wants to get verbal permission to do a BMET. Please call to discuss.

## 2018-12-31 NOTE — Telephone Encounter (Signed)
Okay for verbal to do blood work?  Thanks!

## 2019-01-03 NOTE — Telephone Encounter (Signed)
Spoke with pt verbal order given, pt stated he will call back if there is an issue, pt voice understanding and thanks

## 2019-01-05 ENCOUNTER — Ambulatory Visit: Payer: Medicare HMO | Admitting: Podiatry

## 2019-01-07 ENCOUNTER — Ambulatory Visit (INDEPENDENT_AMBULATORY_CARE_PROVIDER_SITE_OTHER): Payer: Medicare HMO | Admitting: Cardiovascular Disease

## 2019-01-07 DIAGNOSIS — I4891 Unspecified atrial fibrillation: Secondary | ICD-10-CM

## 2019-01-07 DIAGNOSIS — Z954 Presence of other heart-valve replacement: Secondary | ICD-10-CM | POA: Diagnosis not present

## 2019-01-07 DIAGNOSIS — Z5181 Encounter for therapeutic drug level monitoring: Secondary | ICD-10-CM | POA: Diagnosis not present

## 2019-01-07 LAB — POCT INR: INR: 4.4 — AB (ref 2.0–3.0)

## 2019-01-07 NOTE — Patient Instructions (Signed)
Description   Spoke with [facility] RN and instructed pt to hold today's dose then continue taking 1 tablet daily except  1.5 tablets every Sunday, Tuesday, and Thursday. Continue normal green intake. Recheck INR in 1 week.

## 2019-01-12 ENCOUNTER — Other Ambulatory Visit: Payer: Self-pay

## 2019-01-12 MED ORDER — FUROSEMIDE 80 MG PO TABS: 80.0000 mg | ORAL_TABLET | Freq: Two times a day (BID) | ORAL | 2 refills | Status: DC

## 2019-01-14 ENCOUNTER — Telehealth: Payer: Self-pay | Admitting: Cardiology

## 2019-01-14 ENCOUNTER — Ambulatory Visit (INDEPENDENT_AMBULATORY_CARE_PROVIDER_SITE_OTHER): Payer: Medicare HMO | Admitting: Pharmacist

## 2019-01-14 ENCOUNTER — Other Ambulatory Visit: Payer: Self-pay

## 2019-01-14 DIAGNOSIS — I4891 Unspecified atrial fibrillation: Secondary | ICD-10-CM

## 2019-01-14 DIAGNOSIS — Z5181 Encounter for therapeutic drug level monitoring: Secondary | ICD-10-CM

## 2019-01-14 DIAGNOSIS — Z954 Presence of other heart-valve replacement: Secondary | ICD-10-CM

## 2019-01-14 DIAGNOSIS — I739 Peripheral vascular disease, unspecified: Secondary | ICD-10-CM

## 2019-01-14 LAB — POCT INR: INR: 5 — AB (ref 2.0–3.0)

## 2019-01-14 NOTE — Telephone Encounter (Signed)
Verbal order given to Amy at [facility] for BMET.

## 2019-01-14 NOTE — Telephone Encounter (Signed)
Amy, Home Health nurse for the patient called to leave a message for Dr. [name]. She wanted [place] to know that his weight up , 192.4 and the nurse did not like the way he presented.She described his appearance as his face was sunken in, and looked like a skeleton, but his legs looked swollen. He just overall did not look well.  His O2 stats were good (97). She was able to get labs on him.   She will see the patient again next week.   The number she gave is a cell phone, and you can leave a detailed message on her voicemail if you are unable to reach her.

## 2019-01-14 NOTE — Telephone Encounter (Signed)
Follow up   Per Amy at [facility] she needs a verbal for BMET. Please call her.

## 2019-01-14 NOTE — Telephone Encounter (Signed)
Received message from the home health nurse regarding the patient's status. Called the patient to get more information from him. Patient reported no shortness of breath and home health nurse reported he had an O2 sat of 97%. Patients weight is normally 189lbs. Today his weight was 192.4lbs. Due to the home health report and the patients weight increasing Dr. Stanford Breed (DOD) was consulted for recommendations. Dr. Stanford Breed ordered 120 mg of lasix 2 times a day for 2 days, then return to his normal dose of lasix. The patient was informed of this and he verbalized understanding.

## 2019-01-18 ENCOUNTER — Other Ambulatory Visit: Payer: Self-pay | Admitting: Cardiology

## 2019-01-20 ENCOUNTER — Other Ambulatory Visit: Payer: Self-pay

## 2019-01-20 ENCOUNTER — Encounter: Payer: Self-pay | Admitting: Physician Assistant

## 2019-01-20 ENCOUNTER — Ambulatory Visit (INDEPENDENT_AMBULATORY_CARE_PROVIDER_SITE_OTHER): Payer: Medicare HMO | Admitting: Physician Assistant

## 2019-01-20 VITALS — BP 94/62 | HR 60 | Temp 94.1°F | Ht 61.0 in | Wt 202.0 lb

## 2019-01-20 DIAGNOSIS — I25810 Atherosclerosis of coronary artery bypass graft(s) without angina pectoris: Secondary | ICD-10-CM | POA: Diagnosis not present

## 2019-01-20 DIAGNOSIS — Z9889 Other specified postprocedural states: Secondary | ICD-10-CM | POA: Diagnosis not present

## 2019-01-20 DIAGNOSIS — Z952 Presence of prosthetic heart valve: Secondary | ICD-10-CM

## 2019-01-20 DIAGNOSIS — I5033 Acute on chronic diastolic (congestive) heart failure: Secondary | ICD-10-CM

## 2019-01-20 NOTE — Patient Instructions (Addendum)
Medication Instructions:   Take an extra dose of Metolazone on Friday 01/21/2019 and Sunday 01/23/2019 then take on Wednesday 01/26/2019 and Sunday 01/30/2019 Also take 2 extra tablets of Potassium on the days you take your Metolazone  If you need a refill on your cardiac medications before your next appointment, please call your pharmacy.   Lab work:  You will need to have labs (blood work) drawn today:  BMET CBC   If you have labs (blood work) drawn today and your tests are completely normal, you will receive your results only by: Marland Kitchen MyChart Message (if you have MyChart) OR . A paper copy in the mail If you have any lab test that is abnormal or we need to change your treatment, we will call you to review the results.  Testing/Procedures:  Your physician has requested that you have an echocardiogram. Echocardiography is a painless test that uses sound waves to create images of your heart. It provides your doctor with information about the size and shape of your heart and how well your heart's chambers and valves are working. This procedure takes approximately one hour. There are no restrictions for this procedure.   Follow-Up: At [facility], you and your health needs are our priority.  As part of our continuing mission to provide you with exceptional heart care, we have created designated Provider Care Teams.  These Care Teams include your primary Cardiologist (physician) and Advanced Practice Providers (APPs -  Physician Assistants and Nurse Practitioners) who all work together to provide you with the care you need, when you need it. . Your physician recommends that you schedule a follow-up appointment in 2 week with Lance Cook   Any Other Special Instructions Will Be Listed Below (If Applicable).

## 2019-01-20 NOTE — Progress Notes (Signed)
Cardiology Office Note    Date:  01/22/2019   ID:  Lance Cook, DOB 10-Nov-1960, MRN [id number]  PCP:  Tsosie Billing, MD  Cardiologist:  Dr. [name]   Chief Complaint  Patient presents with   Follow-up    seen for Dr. [name]    History of Present Illness:  Lance Cook is a 58 y.o. male with PMH ofAVR (s/p Bentall procedure in 1988),severe mitral regurgitation(s/p StJude mechanical valve placed 12/2014),CAD s/pSVG-RCA which is known to be occluded by cath in April 2016, chronic atrial fibrillation, chronic diastolic heart failure with baseline EF 65 to 70%, prior CVA, chronic venous insufficiency, obstructive sleep apnea on CPAP and recurrent C. difficile infection. Patient was seen by Dr. [name] on 02/26/2018 at which time he has had continued increased lower extremity swelling with 5 pound weight gain. He also had cramps as well which is relieved by muscle relaxant. CK was normal. His diuretic was switched from 80 mg twice daily of Lasix to 60 mg twice daily of torsemide. He was seen back by Fabian Sharp PA-C on 03/08/2018, at which time he continued to have worsening lower extremity edema. It appears it was recommended the patient to be admitted to the hospital for IV diuresis, however patient declined to do so because of upcoming fishing trip. He was reverted back to 80 mg twice daily of Lasix with additional 5 mg Zaroxolyn for 3 days.  By the time I saw the patient back in July 2019, he lost 20 pounds of weight after 3 days of metolazone.  I switched Zaroxolyn to once weekly dosing.   Patient was recently seen by Dr. [name] via telehealth medicine on 11/24/2018.  His weight at home at the time was 198 pounds.  He was complaining of increasing lower extremity edema at the time.  He was instructed to take additional dose of Zaroxolyn on top of the weekly dosing.  Based on the recent phone note, it appears his baseline dry weight at home has been around 189 pounds.  His  home health nurse recently reached out to the office regarding weight gain with weight up to 192 pounds.  Dr. Stanford Breed who was the DOD at the time recommended increase Lasix to 120 mg twice daily for 2 days they returned to his normal dose of Lasix.  Patient returns today for cardiology office follow-up.  His weight today is 202 pounds, his weight at home this morning was 198 pounds.  Since his dry weight is 189 pounds, he is at least 9 pounds over his baseline dry weight.  He walks with a rolling walker.  His blood pressure is borderline low today.  He did take a dose of metolazone last Sunday and this past Wednesday. His lower extremity skins is very taut and have a woody appearance.  I recommended a additional dose of metolazone with extra potassium both for Friday and also this coming Sunday.  By next week, he has been instructed to reduce the metolazone to twice weekly every Wednesday and Sunday.  He will need an echocardiogram.  On physical exam, he has mechanical click near the mitral valve however also have a very loud murmur as well.  I plan to see the patient back in 2 weeks for reassessment.  I asked him to monitor his blood pressure at home to make sure it does not drop any further.  Overall, I think the patient's clinical condition is declining and I am worried about his volume status continue to  worsen over time.  Fortunately, it appears majority of the fluid is accumulating in his leg as his lungs is clear on exam.  Even though he has diminished breath sounds in the right base, this is consistent with prior diagnosis of elevated right hemidiaphragm based on previous chest x-ray in 2019.   Past Medical History:  Diagnosis Date   Acute on chronic diastolic CHF (congestive heart failure) ([place]) 08/27/2016   Anemia    Anxiety    ARDS (adult respiratory distress syndrome) ([place]) 01/27/2015   Arthritis    Ascending aortic dissection ([place]) 07/14/2008   Localized dissection of ascending aorta  noted on CTA in 2009 and stable on CTA in 2011   Asymptomatic chronic venous hypertension 01/15/2010   Overview:  Overview:  Qualifier: Diagnosis of  By: [name] MD, [place]   Last Assessment & Plan:  I suggested he go to a vein clinic to see if he could be qualified compression hose of some type, since he is not a surgical candidate according to the vascular surgeons.    Atrial fibrillation (HCC)    chronic persistent   Bell's palsy    C. difficile diarrhea    CAD (coronary artery disease)    Old scar inferior wall myoview, 10/2009 EF 52%.  He did have previous SVG to RCA but no obstructive disease noted on his most recent cath.  SVG occluded.    Cellulitis 04/02/2014   Cerebral artery occlusion with cerebral infarction ([place]) 10/08/2011   Overview:  Overview:  And hx of TIA prior to CABG, all thought due to systemic emboli prior to coumadin    Chronic diastolic heart failure ([place]) 03/01/2015   a. 12/2015: echo showing a preserved EF of 65-70%, moderate AS, and moderate TR.    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 No high-grade dysplasia    HTN (hypertension) 02/23/2014   Hyperlipidemia     Hypertension    Hypogonadism male 04/08/2012   Impaired glucose tolerance 10/08/2011   Morbid obesity ([place]) 04/02/2014   Myocardial infarction [facility]) age 62   OSA (obstructive sleep apnea)    CPAP   Peripheral vascular disease (HCC)    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 0/26/3785   33 mm St Jude bileaflet mechanical valve placed via right mini thoracotomy approach   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    1988 - Dr Blase Mess at [facility] in [place], [place]   Severe mitral regurgitation 10/11/2014   Testicular cancer [facility])    He was 58 y/o. He had surgical resection and rad  tx's.    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 02/03/2005   Venous (peripheral) insufficiency 02/03/2005    Past Surgical History:  Procedure Laterality Date   aortic truck     [place]   25 mm St Jude mechanical valve conduit - Dr Blase Mess at [facility] in [place], [place]  06/01/2018   Procedure: BIOPSY;  Surgeon: Jerene Bears, MD;  Location: [facility];  Service: Gastroenterology;;   CARDIAC VALVE REPLACEMENT     [phone]   CHOLECYSTECTOMY  2011   COLONOSCOPY WITH PROPOFOL N/A 06/17/2016   Procedure: COLONOSCOPY WITH PROPOFOL;  Surgeon: Jerene Bears, MD;  Location: [facility];  Service: Gastroenterology;  Laterality: N/A;   COLONOSCOPY WITH PROPOFOL N/A 06/01/2018   Procedure: COLONOSCOPY WITH PROPOFOL;  Surgeon: Jerene Bears, MD;  Location: [facility];  Service: Gastroenterology;  Laterality: N/A;   CORONARY ARTERY BYPASS GRAFT     LASER ABLATION Left 03/06/2010   leg   MITRAL VALVE REPLACEMENT Right 01/24/2015   Procedure: Re-Operation, MINIMALLY INVASIVE MITRAL VALVE (MV) REPLACEMENT.;  Surgeon: Rexene Alberts, MD;  Location: [place];  Service: Open Heart  Surgery;  Laterality: Right;   ORCHIECTOMY  age 72   testicular cancer   OTOPLASTY     bilateral, age 33   POLYPECTOMY  06/01/2018   Procedure: POLYPECTOMY;  Surgeon: Jerene Bears, MD;  Location: [facility];  Service: Gastroenterology;;   TEE WITHOUT CARDIOVERSION N/A 10/11/2014   Procedure: TRANSESOPHAGEAL ECHOCARDIOGRAM (TEE);  Surgeon: Pixie Casino, MD;  Location: [facility] ENDOSCOPY;  Service: Cardiovascular;  Laterality: N/A;   TEE WITHOUT CARDIOVERSION N/A 01/24/2015   Procedure: TRANSESOPHAGEAL ECHOCARDIOGRAM (TEE);  Surgeon: Rexene Alberts, MD;  Location: [place];  Service: Open Heart Surgery;  Laterality: N/A;   TONSILLECTOMY  1967   TOOTH EXTRACTION  2016    Current Medications: Outpatient Medications Prior to Visit  Medication Sig Dispense Refill   acetaminophen (TYLENOL) 500 MG tablet Take 500-1,000 mg by mouth daily as needed for moderate pain or headache.      allopurinol (ZYLOPRIM) 300 MG tablet Take 300 mg by mouth daily.     atenolol (TENORMIN) 100 MG tablet Take 0.25 tablet (25 mg total) by mouth twice daily. 90 tablet 2   digoxin (LANOXIN) 0.25 MG tablet Take 1 tablet (250 mcg total) by mouth daily. 90 tablet 2   enoxaparin (LOVENOX) 100 MG/ML injection Inject 100 mg into the skin 2 (two) times daily.     furosemide (LASIX) 80 MG tablet Take 1 tablet (80 mg total) by mouth 2 (two) times daily. 180 tablet 2   gabapentin (NEURONTIN) 100 MG capsule Take 100 mg by mouth 3 (three) times daily as needed.     Homeopathic Products (SIMILASAN PINK EYE RELIEF) SOLN Place 1 drop into both eyes daily as needed (irritation).     magnesium oxide (MAG-OX) 400 (241.3 MG) MG tablet Take 1 tablet (400 mg total) by mouth 2 (two) times daily. 180 tablet 3   metolazone (ZAROXOLYN) 5 MG tablet Take 1 tablet (5 mg total) by mouth once a week. 14 tablet 3   mupirocin ointment (BACTROBAN) 2 % Apply 1 application topically 2 (two) times daily as needed (infection prevention).       NON FORMULARY Apply 2 Pump topically 3 (three) times daily. LIDO5/DICLO3/CYCLO2/GABA 2  oxyCODONE (OXY IR/ROXICODONE) 5 MG immediate release tablet Take 1 tablet (5 mg total) by mouth every 8 (eight) hours as needed for severe pain. 90 tablet 0   Potassium 99 MG TABS Take 99-198 mg by mouth daily as needed (cramping).     potassium chloride (K-DUR) 10 MEQ tablet Take 2 tablets (20 mEq total) by mouth 2 (two) times daily. 360 tablet 2   traZODone (DESYREL) 50 MG tablet Take 100 mg by mouth at bedtime.      warfarin (COUMADIN) 5 MG tablet TAKE 1 TO 1 AND 1/2 TABLETS DAILY AS DIRECTED BY COUMADIN CLINIC 135 tablet 1   methocarbamol (ROBAXIN) 500 MG tablet Take 1 tablet (500 mg total) by mouth 3 (three) times daily. 270 tablet 0   oxyCODONE (OXY IR/ROXICODONE) 5 MG immediate release tablet Take 1 tablet (5 mg total) by mouth every 8 (eight) hours as needed for moderate pain or severe pain. 90 tablet 0   oxyCODONE (OXY IR/ROXICODONE) 5 MG immediate release tablet Take 1 tablet (5 mg total) by mouth every 8 (eight) hours as needed for severe pain. 90 tablet 0   No facility-administered medications prior to visit.      Allergies:   Penicillins; Adhesive [tape]; and Bactrim [sulfamethoxazole-trimethoprim]   Social History   Socioeconomic History   Marital status: Married    Spouse name: Not on file   Number of children: 0   Years of education: Not on file   Highest education level: Not on file  Occupational History   Occupation: Disabled Environmental education officer: UNEMPLOYED  Social Designer, fashion/clothing strain: Not on file   Food insecurity:    Worry: Not on file    Inability: Not on file   Transportation needs:    Medical: Not on file    Non-medical: Not on file  Tobacco Use   Smoking status: Former Smoker    Types: Cigars   Smokeless tobacco: Never Used   Tobacco comment: about 3 yearly- cigar  Substance and Sexual Activity   Alcohol use: Yes     Alcohol/week: 0.0 standard drinks    Comment: social   Drug use: No   Sexual activity: Not on file  Lifestyle   Physical activity:    Days per week: Not on file    Minutes per session: Not on file   Stress: Not on file  Relationships   Social connections:    Talks on phone: Not on file    Gets together: Not on file    Attends religious service: Not on file    Active member of club or organization: Not on file    Attends meetings of clubs or organizations: Not on file    Relationship status: Not on file  Other Topics Concern   Not on file  Social History Narrative   Not on file     Family History:  The patient's family history includes Cancer in his maternal aunt and maternal uncle; Diabetes in his father; Heart disease in his mother; Hypertension in an other family member; Other in his mother; Stroke in an other family member; Throat cancer in his mother.   ROS:   Please see the history of present illness.    ROS All other systems reviewed and are negative.   PHYSICAL EXAM:   VS:  BP 94/62    Pulse 60    Temp (!) 94.1 F (34.5 C)    Ht 5\' 1"  (1.549 m)  Wt 202 lb (91.6 kg)    BMI 38.17 kg/m    GEN: Chronically ill HEENT: normal  Neck: no JVD, carotid bruits, or masses Cardiac: RRR; no rubs, or gallops. 2-3+ edema.  Loud mechanical click with harsh murmur in the mitral valve area Respiratory:  clear to auscultation bilaterally, normal work of breathing GI: soft, nontender, nondistended, + BS MS: no deformity or atrophy  Skin: warm and dry, no rash Neuro:  Alert and Oriented x 3, Strength and sensation are intact Psych: euthymic mood, full affect  Wt Readings from Last 3 Encounters:  01/20/19 202 lb (91.6 kg)  11/24/18 199 lb 9.6 oz (90.5 kg)  07/14/18 200 lb (90.7 kg)      Studies/Labs Reviewed:   EKG:  EKG is not ordered today.    Recent Labs: 01/28/2018: TSH 2.890 09/16/2018: Magnesium 2.3 01/20/2019: BUN 55; Creatinine, Ser 1.18; Hemoglobin 10.3;  Platelets 229; Potassium 3.8; Sodium 128   Lipid Panel    Component Value Date/Time   CHOL 118 08/18/2012 1137   TRIG 133.0 08/18/2012 1137   HDL 27.20 (L) 08/18/2012 1137   CHOLHDL 4 08/18/2012 1137   VLDL 26.6 08/18/2012 1137   LDLCALC 64 08/18/2012 1137    Additional studies/ records that were reviewed today include:   Echo 02/03/2018 LV EF: 55% -   60%  ------------------------------------------------------------------- Indications:      Z95.2 Mitral valve replacement. I35.9 Aortic valve disorder.  ------------------------------------------------------------------- History:   PMH:  Obstructive sleep apnea.  Coronary artery disease.  Congestive heart failure.  Stroke.  PMH:   Myocardial infarction.   ------------------------------------------------------------------- Study Conclusions  - Left ventricle: The cavity size was normal. Wall thickness was   increased in a pattern of mild LVH. Systolic function was normal.   The estimated ejection fraction was in the range of 55% to 60%. - Aortic valve: Mechanical St Jude AVR with stable gradients since   2017 and no significant peri valvular regurgitation. - Mitral valve: Normal appearing mechanical St Jude MVR. - Left atrium: The atrium was severely dilated. - Atrial septum: No defect or patent foramen ovale was identified. - Tricuspid valve: There was moderate regurgitation. - Pulmonary arteries: PA peak pressure: 64 mm Hg (S).    ASSESSMENT:    1. Acute on chronic diastolic congestive heart failure ([place])   2. S/P AVR   3. S/P MVR (mitral valve repair)   4. Coronary artery disease involving coronary bypass graft of native heart without angina pectoris      PLAN:  In order of problems listed above:  1. Acute on chronic diastolic heart failure: He appears to be volume overloaded on physical exam.  He has been taking twice weekly dosing of metolazone on top of the Lasix.  I plan to give extra dose of metolazone this  week before reducing it back down to twice weekly dosing by next week.  He is 9 pounds over his baseline dry weight.  He will need a basic metabolic panel and CBC.  I plan to bring the patient back in 2 weeks for reassessment  2. History of AVR and MVR: Harsh loud murmur appreciated besides mechanical click near the mitral valve.  I plan to obtain a repeat echocardiogram especially given her recent volume overload.  On Coumadin given mechanical valve.  3. CAD: Denies any recent chest pain.    Medication Adjustments/Labs and Tests Ordered: Current medicines are reviewed at length with the patient today.  Concerns regarding medicines are outlined above.  Medication changes, Labs and Tests ordered today are listed in the Patient Instructions below. Patient Instructions  Medication Instructions:   Take an extra dose of Metolazone on Friday 01/21/2019 and Sunday 01/23/2019 then take on Wednesday 01/26/2019 and Sunday 01/30/2019 Also take 2 extra tablets of Potassium on the days you take your Metolazone  If you need a refill on your cardiac medications before your next appointment, please call your pharmacy.   Lab work:  You will need to have labs (blood work) drawn today:  BMET CBC   If you have labs (blood work) drawn today and your tests are completely normal, you will receive your results only by:  [place] (if you have MyChart) OR  A paper copy in the mail If you have any lab test that is abnormal or we need to change your treatment, we will call you to review the results.  Testing/Procedures:  Your physician has requested that you have an echocardiogram. Echocardiography is a painless test that uses sound waves to create images of your heart. It provides your doctor with information about the size and shape of your heart and how well your hearts chambers and valves are working. This procedure takes approximately one hour. There are no restrictions for this  procedure.   Follow-Up: At [facility], you and your health needs are our priority.  As part of our continuing mission to provide you with exceptional heart care, we have created designated Provider Care Teams.  These Care Teams include your primary Cardiologist (physician) and Advanced Practice Providers (APPs -  Physician Assistants and Nurse Practitioners) who all work together to provide you with the care you need, when you need it.  Your physician recommends that you schedule a follow-up appointment in 2 week with Almyra Deforest   Any Other Special Instructions Will Be Listed Below (If Applicable).       Hilbert Corrigan, [place]  01/22/2019 11:24 PM    [facility] [place], [place], [place]  [address] Phone: [phone]; Fax: [phone]

## 2019-01-21 ENCOUNTER — Telehealth (HOSPITAL_COMMUNITY): Payer: Self-pay | Admitting: Rehabilitation

## 2019-01-21 ENCOUNTER — Ambulatory Visit (INDEPENDENT_AMBULATORY_CARE_PROVIDER_SITE_OTHER): Payer: Medicare HMO | Admitting: Internal Medicine

## 2019-01-21 DIAGNOSIS — Z5181 Encounter for therapeutic drug level monitoring: Secondary | ICD-10-CM

## 2019-01-21 DIAGNOSIS — Z954 Presence of other heart-valve replacement: Secondary | ICD-10-CM

## 2019-01-21 DIAGNOSIS — I4891 Unspecified atrial fibrillation: Secondary | ICD-10-CM | POA: Diagnosis not present

## 2019-01-21 LAB — CBC
Hematocrit: 35.5 % — ABNORMAL LOW (ref 37.5–51.0)
Hemoglobin: 10.3 g/dL — ABNORMAL LOW (ref 13.0–17.7)
MCH: 20.5 pg — ABNORMAL LOW (ref 26.6–33.0)
MCHC: 29 g/dL — ABNORMAL LOW (ref 31.5–35.7)
MCV: 71 fL — ABNORMAL LOW (ref 79–97)
Platelets: 229 10*3/uL (ref 150–450)
RBC: 5.02 x10E6/uL (ref 4.14–5.80)
RDW: 18.2 % — ABNORMAL HIGH (ref 11.6–15.4)
WBC: 13.1 10*3/uL — ABNORMAL HIGH (ref 3.4–10.8)

## 2019-01-21 LAB — BASIC METABOLIC PANEL
BUN/Creatinine Ratio: 47 — ABNORMAL HIGH (ref 9–20)
BUN: 55 mg/dL — ABNORMAL HIGH (ref 6–24)
CO2: 31 mmol/L — ABNORMAL HIGH (ref 20–29)
Calcium: 7.8 mg/dL — ABNORMAL LOW (ref 8.7–10.2)
Chloride: 85 mmol/L — ABNORMAL LOW (ref 96–106)
Creatinine, Ser: 1.18 mg/dL (ref 0.76–1.27)
GFR calc Af Amer: 79 mL/min/{1.73_m2} (ref >59)
GFR calc non Af Amer: 68 mL/min/{1.73_m2} (ref >59)
Glucose: 70 mg/dL (ref 65–99)
Potassium: 3.8 mmol/L (ref 3.5–5.2)
Sodium: 128 mmol/L — ABNORMAL LOW (ref 134–144)

## 2019-01-21 LAB — POCT INR: INR: 8 — AB (ref 2.0–3.0)

## 2019-01-21 NOTE — Telephone Encounter (Signed)
The above patient or their representative was contacted and gave the following answers to these questions:         Do you have any of the following symptoms? No  Fever                    Cough                   Shortness of breath  Do  you have any of the following other symptoms? No   muscle pain         vomiting,        diarrhea        rash         weakness        red eye        abdominal pain         bruising          bruising or bleeding              joint pain           severe headache    Have you been in contact with someone who was or has been sick in the past 2 weeks? No  Yes                 Unsure                         Unable to assess   Does the person that you were in contact with have any of the following symptoms?   Cough         shortness of breath           muscle pain         vomiting,            diarrhea            rash            weakness           fever            red eye           abdominal pain           bruising  or  bleeding                joint pain                severe headache               Have you  or someone you have been in contact with traveled internationally in th last month? No        If yes, which countries?   Have you  or someone you have been in contact with traveled outside [place] in th last month? No         If yes, which state and city?   COMMENTS OR ACTION PLAN FOR THIS PATIENT:          

## 2019-01-22 ENCOUNTER — Encounter: Payer: Self-pay | Admitting: Physician Assistant

## 2019-01-25 ENCOUNTER — Other Ambulatory Visit: Payer: Self-pay

## 2019-01-25 ENCOUNTER — Encounter: Payer: Self-pay | Admitting: Vascular Surgery

## 2019-01-25 ENCOUNTER — Ambulatory Visit (INDEPENDENT_AMBULATORY_CARE_PROVIDER_SITE_OTHER): Payer: Medicare HMO | Admitting: Vascular Surgery

## 2019-01-25 ENCOUNTER — Ambulatory Visit (INDEPENDENT_AMBULATORY_CARE_PROVIDER_SITE_OTHER): Payer: Medicare HMO | Admitting: Pharmacist

## 2019-01-25 ENCOUNTER — Ambulatory Visit (HOSPITAL_COMMUNITY)
Admission: RE | Admit: 2019-01-25 | Discharge: 2019-01-25 | Disposition: A | Discharge status: Home / Self Care | Payer: Medicare HMO | Source: Ambulatory Visit | Attending: Family | Admitting: Family

## 2019-01-25 VITALS — BP 104/64 | HR 60 | Resp 20 | Ht 61.0 in | Wt 201.2 lb

## 2019-01-25 DIAGNOSIS — I4891 Unspecified atrial fibrillation: Secondary | ICD-10-CM

## 2019-01-25 DIAGNOSIS — Z954 Presence of other heart-valve replacement: Secondary | ICD-10-CM

## 2019-01-25 DIAGNOSIS — I739 Peripheral vascular disease, unspecified: Secondary | ICD-10-CM | POA: Diagnosis not present

## 2019-01-25 DIAGNOSIS — Z5181 Encounter for therapeutic drug level monitoring: Secondary | ICD-10-CM | POA: Diagnosis not present

## 2019-01-25 DIAGNOSIS — I87303 Chronic venous hypertension (idiopathic) without complications of bilateral lower extremity: Secondary | ICD-10-CM

## 2019-01-25 LAB — POCT INR: INR: 1.4 — AB (ref 2.0–3.0)

## 2019-01-25 NOTE — Progress Notes (Signed)
Vascular and Vein Specialist of [place]  Patient name: Lance Cook MRN: [id number] DOB: 08-06-1961 Sex: male  REASON FOR CONSULT: Evaluation of lower extremity discomfort  HPI: Lance Cook is a 58 y.o. male, who is seen today for evaluation of lower extremity issues.  He has multitude of medical problems.  Has had long history of cardiac valvular issues.  Has long history of venous hypertension.  Was treated in the past at a vein center with ablation of left great saphenous vein.  Had harvesting of right great saphenous vein for coronary bypass years ago.  Has a massively swollen lower extremities with edema fluid leave weeping from these.  Also has a severe history of congestive heart failure.  Seen today for concern regarding potential arterial insufficiency.  Is unable to elevate his legs due to shortness of breath.  Also very difficult time with the compression garments due to morbid obesity and swelling  Past Medical History:  Diagnosis Date  . Acute on chronic diastolic CHF (congestive heart failure) ([place]) 08/27/2016  . Anemia   . Anxiety   . ARDS (adult respiratory distress syndrome) ([place]) 01/27/2015  . Arthritis   . Ascending aortic dissection ([place]) 07/14/2008   Localized dissection of ascending aorta noted on CTA in 2009 and stable on CTA in 2011  . Asymptomatic chronic venous hypertension 01/15/2010   Overview:  Overview:  Qualifier: Diagnosis of  By: [name] MD, [name]   Last Assessment & Plan:  I suggested he go to a vein clinic to see if he could be qualified compression hose of some type, since he is not a surgical candidate according to the vascular surgeons.   . Atrial fibrillation ([place])    chronic persistent  . Bell's palsy   . C. difficile diarrhea   . CAD (coronary artery disease)    Old scar inferior wall myoview, 10/2009 EF 52%.  He did have previous SVG to RCA but no obstructive disease noted on his most recent cath.  SVG occluded.    . Cellulitis 04/02/2014  . Cerebral artery occlusion with cerebral infarction ([place]) 10/08/2011   Overview:  Overview:  And hx of TIA prior to CABG, all thought due to systemic emboli prior to coumadin   . Chronic diastolic heart failure ([place]) 03/01/2015   a. 12/2015: echo showing a preserved EF of 65-70%, moderate AS, and moderate TR.   Marland Kitchen Chronic LBP 10/08/2011  . CVA (cerebral vascular accident) ([place]) 10/08/2011   And hx of TIA prior to CABG, all thought due to systemic emboli prior to coumadin   . DIABETES MELLITUS, TYPE II 11/01/2009   Qualifier: Diagnosis of  By: [name] MD, [name]    . Diverticulosis   . 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 Encephalopathy acute 02/05/2015  . Gastric polyps   . GERD (gastroesophageal reflux disease)    not needing medication at thhis time- 01/22/15  . Gout   . H/O mechanical aortic valve replacement 11/01/2009   Qualifier: Diagnosis of  By: [name], [name]    . Hiatal hernia   . History of colon polyps 12/23/2011   Overview:  Overview:  Colonoscopy January 2010, 6 mm rectal tubulovillous adenoma. No high-grade dysplasia   . HTN (hypertension) 02/23/2014  . Hyperlipidemia   . Hypertension   . Hypogonadism male 04/08/2012  . Impaired glucose tolerance 10/08/2011  . Morbid  obesity ([place]) 04/02/2014  . Myocardial infarction [facility]) age 54  . OSA (obstructive sleep apnea)    CPAP  . Peripheral vascular disease ([place])   . 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 1/32/4401   33 mm St Jude bileaflet mechanical valve placed via right mini thoracotomy approach  . 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    1988 - Dr Blase Mess at [facility] in [place], [place]  . Severe mitral regurgitation 10/11/2014  . Testicular cancer [facility])    He was 58 y/o. He had surgical  resection and rad tx's.   Marland Kitchen TIA (transient ischemic attack)    age 72  . Tubulovillous adenoma of colon   . Varicose veins   . 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 Venous (peripheral) insufficiency 02/03/2005    Family History  Problem Relation Age of Onset  . Heart disease Mother   . Throat cancer Mother   . Other Mother        bowel obstruction  . Diabetes Father   . Stroke Other   . Hypertension Other   . Cancer Maternal Aunt        lung, brain  . Cancer Maternal Uncle        brain aneurysm  . Colon cancer Neg Hx     SOCIAL HISTORY: Social History   Socioeconomic History  . Marital status: Married    Spouse name: Not on file  . Number of children: 0  . Years of education: Not on file  . Highest education level: Not on file  Occupational History  . Occupation: Disabled Environmental education officer: UNEMPLOYED  Social Needs  . Financial resource strain: Not on file  . Food insecurity:    Worry: Not on file    Inability: Not on file  . Transportation needs:    Medical: Not on file    Non-medical: Not on file  Tobacco Use  . Smoking status: Former Smoker    Types: Cigars  . Smokeless tobacco: Never Used  . Tobacco comment: about 3 yearly- cigar  Substance and Sexual Activity  . Alcohol use: Yes    Alcohol/week: 0.0 standard drinks    Comment: social  . Drug use: No  . Sexual activity: Not on file  Lifestyle  . Physical activity:    Days per week: Not on file    Minutes per session: Not on file  . Stress: Not on file  Relationships  . Social connections:    Talks on phone: Not on file    Gets together: Not on file    Attends religious service: Not on file    Active member of club or organization: Not on file    Attends meetings of clubs or organizations: Not on file    Relationship status: Not on file  . Intimate partner violence:    Fear of current or ex partner: Not on file    Emotionally abused: Not on file    Physically  abused: Not on file    Forced sexual activity: Not on file  Other Topics Concern  . Not on file  Social History Narrative  . Not on file    Allergies  Allergen Reactions  . Penicillins Hives, Rash and Other (See Comments)    Has patient had a PCN reaction causing immediate rash, facial/tongue/throat swelling, SOB or lightheadedness with hypotension: Yes Has patient had a PCN reaction causing  severe rash involving mucus membranes or skin necrosis: Yes Has patient had a PCN reaction that required hospitalization unsure Has patient had a PCN reaction occurring within the last 10 years: yes If all of the above answers are "NO", then may proceed with Cephalosporin use.  . Adhesive [Tape] Other (See Comments)    Shin irritation  . Bactrim [Sulfamethoxazole-Trimethoprim] Other (See Comments)    CDIF, when taking oral tablets    Current Outpatient Medications  Medication Sig Dispense Refill  . acetaminophen (TYLENOL) 500 MG tablet Take 500-1,000 mg by mouth daily as needed for moderate pain or headache.     . allopurinol (ZYLOPRIM) 300 MG tablet Take 300 mg by mouth daily.    Marland Kitchen atenolol (TENORMIN) 100 MG tablet Take 0.25 tablet (25 mg total) by mouth twice daily. 90 tablet 2  . digoxin (LANOXIN) 0.25 MG tablet Take 1 tablet (250 mcg total) by mouth daily. 90 tablet 2  . enoxaparin (LOVENOX) 100 MG/ML injection Inject 100 mg into the skin 2 (two) times daily. For procedures    . furosemide (LASIX) 80 MG tablet Take 1 tablet (80 mg total) by mouth 2 (two) times daily. 180 tablet 2  . gabapentin (NEURONTIN) 100 MG capsule Take 100 mg by mouth 3 (three) times daily as needed.    . Homeopathic Products (SIMILASAN PINK EYE RELIEF) SOLN Place 1 drop into both eyes daily as needed (irritation).    . magnesium oxide (MAG-OX) 400 (241.3 MG) MG tablet Take 1 tablet (400 mg total) by mouth 2 (two) times daily. 180 tablet 3  . methocarbamol (ROBAXIN) 500 MG tablet Take 1 tablet (500 mg total) by mouth 3  (three) times daily. 270 tablet 0  . metolazone (ZAROXOLYN) 5 MG tablet Take 1 tablet (5 mg total) by mouth once a week. 14 tablet 3  . mupirocin ointment (BACTROBAN) 2 % Apply 1 application topically 2 (two) times daily as needed (infection prevention).     . NON FORMULARY Apply 2 Pump topically 3 (three) times daily. LIDO5/DICLO3/CYCLO2/GABA 2    . oxyCODONE (OXY IR/ROXICODONE) 5 MG immediate release tablet Take 1 tablet (5 mg total) by mouth every 8 (eight) hours as needed for severe pain. 90 tablet 0  . Potassium 99 MG TABS Take 99-198 mg by mouth daily as needed (cramping).    . potassium chloride (K-DUR) 10 MEQ tablet Take 2 tablets (20 mEq total) by mouth 2 (two) times daily. 360 tablet 2  . traZODone (DESYREL) 100 MG tablet     . warfarin (COUMADIN) 5 MG tablet TAKE 1 TO 1 AND 1/2 TABLETS DAILY AS DIRECTED BY COUMADIN CLINIC 135 tablet 1   No current facility-administered medications for this visit.     REVIEW OF SYSTEMS:  [X]  denotes positive finding, [ ]  denotes negative finding Cardiac  Comments:  Chest pain or chest pressure:    Shortness of breath upon exertion:    Short of breath when lying flat:    Irregular heart rhythm: x       Vascular    Pain in calf, thigh, or hip brought on by ambulation:    Pain in feet at night that wakes you up from your sleep:     Blood clot in your veins:    Leg swelling:  x       Pulmonary    Oxygen at home:    Productive cough:     Wheezing:         Neurologic  Sudden weakness in arms or legs:     Sudden numbness in arms or legs:     Sudden onset of difficulty speaking or slurred speech:    Temporary loss of vision in one eye:     Problems with dizziness:         Gastrointestinal    Blood in stool:     Vomited blood:         Genitourinary    Burning when urinating:     Blood in urine:        Psychiatric    Major depression:         Hematologic    Bleeding problems:    Problems with blood clotting too easily:         Skin    Rashes or ulcers: x       Constitutional    Fever or chills:      PHYSICAL EXAM: Vitals:   01/25/19 1246  BP: 104/64  Pulse: 60  Resp: 20  SpO2: 100%  Weight: 201 lb 3.2 oz (91.3 kg)  Height: 5\' 1"  (1.549 m)    GENERAL: The patient is a well-nourished male, in no acute distress. The vital signs are documented above. CARDIOVASCULAR: 2+ radial pulses.  Unable to palpate pedal pulses. PULMONARY: There is good air exchange  ABDOMEN: Soft and non-tender  MUSCULOSKELETAL: There are no major deformities or cyanosis. NEUROLOGIC: No focal weakness or paresthesias are detected. SKIN: Marked progressive changes of bilateral venous hypertension with hemosiderin deposit and irregular skin.  Also weeping from open ulcerations PSYCHIATRIC: The patient has a normal affect.  DATA:  Noninvasive arterial studies today revealed centrally normal lower extremity arterial studies.  Ankle arm index 1.0 on the right and 0.94 on the left with biphasic waveforms  MEDICAL ISSUES: I long discussion with the patient.  Explained no specific treatment of his progressed advanced venous hypertension.  Explained the importance of elevation when possible but he has a difficult time with this due to his shortness of breath.  Also explained the importance of wrapping his legs whenever possible as well.  He does have a pneumatic compression pump and reports good use of this but only uses it about once or twice a month.  I explained the critical importance is continuing to remove as much swelling as possible.  He understands the discussion will see [place] again on an as-needed basis   Rosetta Posner, MD [facility] Vascular and Vein Specialists of [facility] Tel [phone] Pager [phone]

## 2019-01-26 NOTE — Progress Notes (Signed)
The patient has been notified of the result and verbalized understanding.  All questions (if any) were answered. Jacqulynn Cadet, [place] 01/26/2019 2:35 PM

## 2019-01-28 ENCOUNTER — Telehealth (HOSPITAL_COMMUNITY): Payer: Self-pay | Admitting: Cardiology

## 2019-01-28 ENCOUNTER — Telehealth (HOSPITAL_COMMUNITY): Payer: Self-pay | Admitting: Radiology

## 2019-01-28 NOTE — Telephone Encounter (Signed)
Opened in error

## 2019-01-28 NOTE — Telephone Encounter (Signed)

## 2019-01-28 NOTE — Telephone Encounter (Signed)
Informed Lance Cook once able to speak to him, that I was able to find additional DPR records and that we were able to speak to his wife in the future.

## 2019-01-28 NOTE — Telephone Encounter (Signed)
Called #on file. Woman answered phone, claimed to be sitting next to Mr Hoh who was driving at the time and would not put him on the phone. She asked for information concerning my call. I noticed before call that only Mr Boettner's son was authorized for release of information, so I explained that I would have to speak directly to Mr Symanski, and that I could call back when he's not driving. I asked when they would be home to call back, and was told she did not know.

## 2019-01-31 ENCOUNTER — Ambulatory Visit (INDEPENDENT_AMBULATORY_CARE_PROVIDER_SITE_OTHER): Payer: Medicare HMO | Admitting: Internal Medicine

## 2019-01-31 ENCOUNTER — Ambulatory Visit (HOSPITAL_COMMUNITY): Payer: Medicare HMO | Attending: Physician Assistant

## 2019-01-31 ENCOUNTER — Other Ambulatory Visit: Payer: Self-pay

## 2019-01-31 DIAGNOSIS — I5033 Acute on chronic diastolic (congestive) heart failure: Secondary | ICD-10-CM | POA: Diagnosis present

## 2019-01-31 DIAGNOSIS — Z954 Presence of other heart-valve replacement: Secondary | ICD-10-CM | POA: Diagnosis not present

## 2019-01-31 DIAGNOSIS — I4891 Unspecified atrial fibrillation: Secondary | ICD-10-CM | POA: Diagnosis not present

## 2019-01-31 DIAGNOSIS — Z5181 Encounter for therapeutic drug level monitoring: Secondary | ICD-10-CM

## 2019-01-31 LAB — POCT INR: INR: 2.6 (ref 2.0–3.0)

## 2019-02-02 ENCOUNTER — Telehealth: Payer: Self-pay | Admitting: Cardiology

## 2019-02-02 NOTE — Telephone Encounter (Signed)
Unable to reach pt or leave a message mail box full. Spoke with amy, aware patient has an appointment tomorrow with hao meng pa. She will reach out to the patient to make sure he knows about the appointment. She is also going to fax the bmp that she drew recently on him.

## 2019-02-02 NOTE — Telephone Encounter (Signed)
New message   Pt c/o swelling: STAT is pt has developed SOB within 24 hours  1) How much weight have you gained and in what time span? Per Amy has gained 5lbs within 3 days   2) If swelling, where is the swelling located? Lower extremities both legs and up to his thighs  3) Are you currently taking a fluid pill? Yes   4) Are you currently SOB?yes   5) Do you have a log of your daily weights (if so, list)? Yes   6) Have you gained 3 pounds in a day or 5 pounds in a week?yes   7) Have you traveled recently?no

## 2019-02-03 ENCOUNTER — Ambulatory Visit: Payer: Medicare HMO | Admitting: Medical

## 2019-02-03 NOTE — Progress Notes (Signed)
Will discuss during office visit today

## 2019-02-04 ENCOUNTER — Encounter: Payer: Self-pay | Admitting: Physician Assistant

## 2019-02-04 ENCOUNTER — Other Ambulatory Visit: Payer: Self-pay

## 2019-02-04 ENCOUNTER — Ambulatory Visit (INDEPENDENT_AMBULATORY_CARE_PROVIDER_SITE_OTHER): Payer: Medicare HMO | Admitting: Physician Assistant

## 2019-02-04 VITALS — BP 92/50 | Temp 97.3°F | Ht 60.0 in | Wt 204.0 lb

## 2019-02-04 DIAGNOSIS — I482 Chronic atrial fibrillation, unspecified: Secondary | ICD-10-CM

## 2019-02-04 DIAGNOSIS — Z952 Presence of prosthetic heart valve: Secondary | ICD-10-CM | POA: Diagnosis not present

## 2019-02-04 DIAGNOSIS — G4733 Obstructive sleep apnea (adult) (pediatric): Secondary | ICD-10-CM

## 2019-02-04 DIAGNOSIS — I5033 Acute on chronic diastolic (congestive) heart failure: Secondary | ICD-10-CM

## 2019-02-04 DIAGNOSIS — Z79899 Other long term (current) drug therapy: Secondary | ICD-10-CM

## 2019-02-04 MED ORDER — POTASSIUM CHLORIDE ER 20 MEQ PO TBCR: 40.0000 meq | EXTENDED_RELEASE_TABLET | Freq: Two times a day (BID) | ORAL | 1 refills | Status: DC

## 2019-02-04 MED ORDER — METOLAZONE 2.5 MG PO TABS: 2.5000 mg | ORAL_TABLET | ORAL | 0 refills | Status: DC

## 2019-02-04 NOTE — Telephone Encounter (Addendum)
° °  Please contact patient at [phone]. Please clarify appointment date and time. Scheduler unable to provide patient details, no appointment in [facility]

## 2019-02-04 NOTE — Telephone Encounter (Signed)
Spoke with patient who reports he was not aware of scheduled appointment on 02/03/19 @ 915am. I rescheduled him for OV with Isaac Laud PA today at 3:15pm  His weight today is 207lbs.

## 2019-02-04 NOTE — Patient Instructions (Addendum)
Medication Instructions:   INCREASE METOLAZONE TO 2.5 MG EVERY OTHER DAY  INCREASE POTASSIUM TO 40 MEQ 2 TIMES A DAY  If you need a refill on your cardiac medications before your next appointment, please call your pharmacy.   Lab work:  YOU WILL NEED TO HAVE LABS (BLOOD WORK) DRAWN TODAY AND IN 1 WEEK: BMET  If you have labs (blood work) drawn today and your tests are completely normal, you will receive your results only by: Marland Kitchen MyChart Message (if you have MyChart) OR . A paper copy in the mail If you have any lab test that is abnormal or we need to change your treatment, we will call you to review the results.  Testing/Procedures:  NONE ordered at this time of appointment    Follow-Up: At [facility], you and your health needs are our priority.  As part of our continuing mission to provide you with exceptional heart care, we have created designated Provider Care Teams.  These Care Teams include your primary Cardiologist (physician) and Advanced Practice Providers (APPs -  Physician Assistants and Nurse Practitioners) who all work together to provide you with the care you need, when you need it. . You will need a follow up appointment in 2 weeks with.[name], MD   Any Other Special Instructions Will Be Listed Below (If Applicable).

## 2019-02-04 NOTE — Progress Notes (Signed)
Cardiology Office Note    Date:  02/05/2019   ID:  Hiren Peplinski, DOB [birth year], MRN [id number]  PCP:  Tsosie Billing, MD  Cardiologist:  Dr. [name]   Chief Complaint  Patient presents with  . Follow-up    LE edema    History of Present Illness:  Giovoni Bunch is a 58 y.o. male with PMH ofmechanical AVR (s/p Bentall procedure in 1988),severe mitral regurgitation(s/p StJude mechanical valve placed 12/2014),CAD s/pSVG-RCA which is known to be occluded by cath in April 2016, chronic atrial fibrillation, chronic diastolic heart failure with baseline EF 65 to 70%, prior CVA, chronic venous insufficiency, obstructive sleep apnea on CPAP and recurrent C. difficile infection. Patient was seen by Dr. [name] on 02/26/2018 at which time he has continued increased lower extremity swelling with 5 pound weight gain. He also had cramps as well which is relieved by muscle relaxant. CK was normal.Hisdiuretic was switched from 80 mg twice daily of Lasix to 60 mg twice daily of torsemide. He was seen back by Fabian Sharp PA-C on 03/08/2018, at which time he continued to have worsening lower extremity edema. It appears it was recommended the patient to be admitted to the hospital for IV diuresis, however patient declined to do so because of upcoming fishing trip. He was reverted back to 80 mg twice daily of Lasix with additional 5 mg Zaroxolyn for 3 days.  By the time I saw the patient back in July 2019, he lost 20 pounds of weight after 3 days of metolazone.  I switched Zaroxolyn to once weekly dosing.  Patient was recently seen by Dr. [name] via telehealth medicine on 11/24/2018.  His weight at home at the time was 198 pounds.  He was complaining of increasing lower extremity edema at the time.  He was instructed to take additional dose of Zaroxolyn on top of the weekly dosing.  Based on the recent phone note, it appears his baseline dry weight at home has been around 189 pounds.  His home health  nurse recently reached out to the office regarding weight gain with weight up to 192 pounds.  Dr. Stanford Breed who was the DOD at the time recommended increase Lasix to 120 mg twice daily for 2 days before return to his normal dose of Lasix.  A repeat lab work was ordered along with echocardiogram.  Echocardiogram showed normal EF however RV pressure is significantly elevated.  I last saw the patient on 01/20/2019, he was significantly volume overloaded, therefore I increased his metolazone to 3 times a week temporarily for 1 week before going back to twice weekly dosing after that.  He presented to the clinic today for follow-up.  Unfortunately his weight is unchanged.  He continues to have at least 2+ pitting edema in bilateral lower extremity, I suspect not all of this edema is truly venous stasis but in fact some of which is related to diastolic heart failure.  I recommend he increase his metolazone to every other day.  I will obtain a basic metabolic panel today and also in one week.   Past Medical History:  Diagnosis Date  . Acute on chronic diastolic CHF (congestive heart failure) ([place]) 08/27/2016  . Anemia   . Anxiety   . ARDS (adult respiratory distress syndrome) ([place]) 01/27/2015  . Arthritis   . Ascending aortic dissection ([place]) 07/14/2008   Localized dissection of ascending aorta noted on CTA in 2009 and stable on CTA in 2011  . Asymptomatic chronic venous hypertension 01/15/2010  Overview:  Overview:  Qualifier: Diagnosis of  By: [name] MD, [place]   Last Assessment & Plan:  I suggested he go to a vein clinic to see if he could be qualified compression hose of some type, since he is not a surgical candidate according to the vascular surgeons.   . Atrial fibrillation ([place])    chronic persistent  . Bell's palsy   . C. difficile diarrhea   . CAD (coronary artery disease)    Old scar inferior wall myoview, 10/2009 EF 52%.  He did have previous SVG to RCA but no obstructive disease noted on  his most recent cath.  SVG occluded.   . Cellulitis 04/02/2014  . Cerebral artery occlusion with cerebral infarction ([place]) 10/08/2011   Overview:  Overview:  And hx of TIA prior to CABG, all thought due to systemic emboli prior to coumadin   . Chronic diastolic heart failure ([place]) 03/01/2015   a. 12/2015: echo showing a preserved EF of 65-70%, moderate AS, and moderate TR.   Marland Kitchen Chronic LBP 10/08/2011  . CVA (cerebral vascular accident) ([place]) 10/08/2011   And hx of TIA prior to CABG, all thought due to systemic emboli prior to coumadin   . DIABETES MELLITUS, TYPE II 11/01/2009   Qualifier: Diagnosis of  By: [name] MD, [name]    . Diverticulosis   . 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 Encephalopathy acute 02/05/2015  . Gastric polyps   . GERD (gastroesophageal reflux disease)    not needing medication at thhis time- 01/22/15  . Gout   . H/O mechanical aortic valve replacement 11/01/2009   Qualifier: Diagnosis of  By: [name], [name]    . Hiatal hernia   . History of colon polyps 12/23/2011   Overview:  Overview:  Colonoscopy January 2010, 6 mm rectal tubulovillous adenoma. No high-grade dysplasia   . HTN (hypertension) 02/23/2014  . Hyperlipidemia   . Hypertension   . Hypogonadism male 04/08/2012  . Impaired glucose tolerance 10/08/2011  . Morbid obesity ([place]) 04/02/2014  . Myocardial infarction [facility]) age 66  . OSA (obstructive sleep apnea)    CPAP  . Peripheral vascular disease ([place])   . 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 2/42/3536   33 mm St Jude bileaflet mechanical valve placed via right mini thoracotomy approach  . 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    1988 - Dr Blase Mess at [facility] in [place], [place]  . Severe mitral regurgitation 10/11/2014  . Testicular cancer [facility])     He was 58 y/o. He had surgical resection and rad tx's.   Marland Kitchen TIA (transient ischemic attack)    age 43  . Tubulovillous adenoma of colon   . Varicose veins   . 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 Venous (peripheral) insufficiency 02/03/2005    Past Surgical History:  Procedure Laterality Date  . aortic truck    . BENTALL PROCEDURE  1988   25 mm St Jude mechanical valve conduit - Dr Blase Mess at [facility] in [place], [place]  . BIOPSY  06/01/2018   Procedure: BIOPSY;  Surgeon: Jerene Bears, MD;  Location: [facility] ENDOS[facility];  Service: Gastroenterology;;  . CARDIAC VALVE REPLACEMENT     [phone]  . CHOLECYSTECTOMY  2011  . COLONOSCOPY WITH PROPOFOL N/A 06/17/2016  Procedure: COLONOSCOPY WITH PROPOFOL;  Surgeon: Jerene Bears, MD;  Location: [facility];  Service: Gastroenterology;  Laterality: N/A;  . COLONOSCOPY WITH PROPOFOL N/A 06/01/2018   Procedure: COLONOSCOPY WITH PROPOFOL;  Surgeon: Jerene Bears, MD;  Location: [facility];  Service: Gastroenterology;  Laterality: N/A;  . CORONARY ARTERY BYPASS GRAFT    . LASER ABLATION Left 03/06/2010   leg  . MITRAL VALVE REPLACEMENT Right 01/24/2015   Procedure: Re-Operation, MINIMALLY INVASIVE MITRAL VALVE (MV) REPLACEMENT.;  Surgeon: Rexene Alberts, MD;  Location: [place];  Service: Open Heart Surgery;  Laterality: Right;  . ORCHIECTOMY  age 46   testicular cancer  . OTOPLASTY     bilateral, age 45  . POLYPECTOMY  06/01/2018   Procedure: POLYPECTOMY;  Surgeon: Jerene Bears, MD;  Location: [facility];  Service: Gastroenterology;;  . TEE WITHOUT CARDIOVERSION N/A 10/11/2014   Procedure: TRANSESOPHAGEAL ECHOCARDIOGRAM (TEE);  Surgeon: Pixie Casino, MD;  Location: [facility] ENDOSCOPY;  Service: Cardiovascular;  Laterality: N/A;  . TEE WITHOUT CARDIOVERSION N/A 01/24/2015   Procedure: TRANSESOPHAGEAL ECHOCARDIOGRAM (TEE);  Surgeon: Rexene Alberts, MD;  Location: [place];  Service: Open Heart Surgery;  Laterality:  N/A;  . TONSILLECTOMY  1967  . TOOTH EXTRACTION  2016    Current Medications: Outpatient Medications Prior to Visit  Medication Sig Dispense Refill  . acetaminophen (TYLENOL) 500 MG tablet Take 500-1,000 mg by mouth daily as needed for moderate pain or headache.     . allopurinol (ZYLOPRIM) 300 MG tablet Take 300 mg by mouth daily.    Marland Kitchen atenolol (TENORMIN) 100 MG tablet Take 0.25 tablet (25 mg total) by mouth twice daily. 90 tablet 2  . digoxin (LANOXIN) 0.25 MG tablet Take 1 tablet (250 mcg total) by mouth daily. 90 tablet 2  . enoxaparin (LOVENOX) 100 MG/ML injection Inject 100 mg into the skin 2 (two) times daily. For procedures    . furosemide (LASIX) 80 MG tablet Take 1 tablet (80 mg total) by mouth 2 (two) times daily. 180 tablet 2  . gabapentin (NEURONTIN) 100 MG capsule Take 100 mg by mouth 3 (three) times daily as needed.    . Homeopathic Products (SIMILASAN PINK EYE RELIEF) SOLN Place 1 drop into both eyes daily as needed (irritation).    . magnesium oxide (MAG-OX) 400 (241.3 MG) MG tablet Take 1 tablet (400 mg total) by mouth 2 (two) times daily. 180 tablet 3  . methocarbamol (ROBAXIN) 500 MG tablet Take 1 tablet (500 mg total) by mouth 3 (three) times daily. 270 tablet 0  . mupirocin ointment (BACTROBAN) 2 % Apply 1 application topically 2 (two) times daily as needed (infection prevention).     . NON FORMULARY Apply 2 Pump topically 3 (three) times daily. LIDO5/DICLO3/CYCLO2/GABA 2    . oxyCODONE (OXY IR/ROXICODONE) 5 MG immediate release tablet Take 1 tablet (5 mg total) by mouth every 8 (eight) hours as needed for severe pain. 90 tablet 0  . Potassium 99 MG TABS Take 99-198 mg by mouth daily as needed (cramping).    . traZODone (DESYREL) 100 MG tablet     . warfarin (COUMADIN) 5 MG tablet TAKE 1 TO 1 AND 1/2 TABLETS DAILY AS DIRECTED BY COUMADIN CLINIC 135 tablet 1  . metolazone (ZAROXOLYN) 5 MG tablet Take 1 tablet (5 mg total) by mouth once a week. 14 tablet 3  . potassium  chloride (K-DUR) 10 MEQ tablet Take 2 tablets (20 mEq total) by mouth 2 (two) times daily. 360 tablet  2   No facility-administered medications prior to visit.      Allergies:   Penicillins; Adhesive [tape]; and Bactrim [sulfamethoxazole-trimethoprim]   Social History   Socioeconomic History  . Marital status: Married    Spouse name: Not on file  . Number of children: 0  . Years of education: Not on file  . Highest education level: Not on file  Occupational History  . Occupation: Disabled Environmental education officer: UNEMPLOYED  Social Needs  . Financial resource strain: Not on file  . Food insecurity:    Worry: Not on file    Inability: Not on file  . Transportation needs:    Medical: Not on file    Non-medical: Not on file  Tobacco Use  . Smoking status: Former Smoker    Types: Cigars  . Smokeless tobacco: Never Used  . Tobacco comment: about 3 yearly- cigar  Substance and Sexual Activity  . Alcohol use: Yes    Alcohol/week: 0.0 standard drinks    Comment: social  . Drug use: No  . Sexual activity: Not on file  Lifestyle  . Physical activity:    Days per week: Not on file    Minutes per session: Not on file  . Stress: Not on file  Relationships  . Social connections:    Talks on phone: Not on file    Gets together: Not on file    Attends religious service: Not on file    Active member of club or organization: Not on file    Attends meetings of clubs or organizations: Not on file    Relationship status: Not on file  Other Topics Concern  . Not on file  Social History Narrative  . Not on file     Family History:  The patient's family history includes Cancer in his maternal aunt and maternal uncle; Diabetes in his father; Heart disease in his mother; Hypertension in an other family member; Other in his mother; Stroke in an other family member; Throat cancer in his mother.   ROS:   Please see the history of present illness.    ROS All other systems reviewed and are  negative.   PHYSICAL EXAM:   VS:  BP (!) 92/50   Temp (!) 97.3 F (36.3 C)   Ht 5' (1.524 m)   Wt 204 lb (92.5 kg)   BMI 39.84 kg/m    GEN: Well nourished, well developed, in no acute distress  HEENT: normal  Neck: no JVD, carotid bruits, or masses Cardiac: RRR; no murmurs, rubs, or gallops.  Lower extremity has woody appearance however also appears to have at least 2+ pitting edema. Respiratory:  clear to auscultation bilaterally, normal work of breathing GI: soft, nontender, nondistended, + BS MS: no deformity or atrophy  Skin: warm and dry, no rash Neuro:  Alert and Oriented x 3, Strength and sensation are intact Psych: euthymic mood, full affect  Wt Readings from Last 3 Encounters:  02/04/19 204 lb (92.5 kg)  01/25/19 201 lb 3.2 oz (91.3 kg)  01/20/19 202 lb (91.6 kg)      Studies/Labs Reviewed:   EKG:  EKG is not ordered today.    Recent Labs: 09/16/2018: Magnesium 2.3 01/20/2019: Hemoglobin 10.3; Platelets 229 02/04/2019: BUN 31; Creatinine, Ser 0.88; Potassium 2.7; Sodium 133   Lipid Panel    Component Value Date/Time   CHOL 118 08/18/2012 1137   TRIG 133.0 08/18/2012 1137   HDL 27.20 (L) 08/18/2012 1137   CHOLHDL  4 08/18/2012 1137   VLDL 26.6 08/18/2012 1137   LDLCALC 64 08/18/2012 1137    Additional studies/ records that were reviewed today include:   Echo 01/31/2019 IMPRESSIONS    1. The left ventricle has normal systolic function, with an ejection fraction of 55-60%. The cavity size was normal. Left ventricular diastolic function could not be evaluated due to mitral valve replacement/repair. There is abnormal septal motion  consistent with left bundle branch block.  2. Mild hypokinesis of the left ventricular, entire septal wall.  3. The right ventricle has normal systolic function. The cavity was normal. There is no increase in right ventricular wall thickness. Right ventricular systolic pressure is severely elevated with an estimated pressure of 62.4  mmHg.  4. Left atrial size was severely dilated.  5. Right atrial size was severely dilated.  6. A 33 St. Jude mechanical valve is present in the mitral position. Procedure Date: 01/24/2015 There is no Echo evidence of rocking of the mitral prosthesis. Echo findings are consistent with perivalvular leak the mitral prosthesis.  7. Mitral valve regurgitation mild perivalvular. Mitral valve gradient appropriate for prosthesis mitral valve stenosis.  8. The tricuspid valve is grossly normal. Tricuspid valve regurgitation is moderate.  9. A 59mm St. Jude mechanical prosthesis valve is present in the aortic position. Normal aortic valve prosthesis. Echo findings are consistent with Mean gradient ~30 mmHg of the aortic prosthesis. 10. The aortic arch is normal in size and structure. 11. There is dilatation of the ascending aorta measuring 38 mm. 12. Post Bentall 1988. 13. The inferior vena cava was dilated in size with <50% respiratory variability.  FINDINGS  Left Ventricle: The left ventricle has normal systolic function, with an ejection fraction of 55-60%. The cavity size was normal. There is no increase in left ventricular wall thickness. Left ventricular diastolic function could not be evaluated due to  mitral valve replacement/repair. There is abnormal (paradoxical) septal motion, consistent with left bundle branch block. Mild hypokinesis of the left ventricular, entire septal wall.    ASSESSMENT:    1. Acute on chronic diastolic heart failure ([place])   2. Medication management   3. S/P AVR   4. H/O mitral valve replacement with mechanical valve   5. Chronic atrial fibrillation   6. OSA (obstructive sleep apnea)      PLAN:  In order of problems listed above:  1. Acute on chronic diastolic heart failure: Recent lab work from outside facility has been reviewed, potassium is low however renal function stable.  He continued to have significant lower extremity edema, I suspect not all the  edema is related to venous stasis.  I will increase metolazone to 2.5 mg every other day.  Also increase potassium supplement to 40 mill equivalent twice daily.  Obtain basic metabolic panel today and also in 1 week.  2. Hypokalemia: Potassium was borderline low recently.  Increase potassium to 40 mEq twice daily.  3. History of mechanical AVR and mechanical MVR: On Coumadin.  Recent echocardiogram showed for perivalvular leak near the mitral prosthesis  4. Chronic atrial fibrillation: On Coumadin.  On low-dose atenolol  5. Hypertension: Blood pressure has been borderline recently.  Initial blood pressure was in the 90s.  Subsequent blood pressure recheck shows systolic blood pressure came up to 101.  He has been instructed to monitor blood pressure closely at home given the increased dose of metolazone.    Medication Adjustments/Labs and Tests Ordered: Current medicines are reviewed at length with the patient today.  Concerns regarding medicines are outlined above.  Medication changes, Labs and Tests ordered today are listed in the Patient Instructions below. Patient Instructions  Medication Instructions:   INCREASE METOLAZONE TO 2.5 MG EVERY OTHER DAY  INCREASE POTASSIUM TO 40 MEQ 2 TIMES A DAY  If you need a refill on your cardiac medications before your next appointment, please call your pharmacy.   Lab work:  YOU WILL NEED TO HAVE LABS (BLOOD WORK) DRAWN TODAY AND IN 1 WEEK: BMET  If you have labs (blood work) drawn today and your tests are completely normal, you will receive your results only by: Marland Kitchen MyChart Message (if you have MyChart) OR . A paper copy in the mail If you have any lab test that is abnormal or we need to change your treatment, we will call you to review the results.  Testing/Procedures:  NONE ordered at this time of appointment    Follow-Up: At [facility], you and your health needs are our priority.  As part of our continuing mission to provide you with  exceptional heart care, we have created designated Provider Care Teams.  These Care Teams include your primary Cardiologist (physician) and Advanced Practice Providers (APPs -  Physician Assistants and Nurse Practitioners) who all work together to provide you with the care you need, when you need it. . You will need a follow up appointment in 2 weeks with.[name], MD   Any Other Special Instructions Will Be Listed Below (If Applicable).       Hilbert Corrigan, [place]  02/05/2019 12:13 PM    [facility] [place], [place], [place]  [address] Phone: [phone]; Fax: [phone]

## 2019-02-05 ENCOUNTER — Telehealth: Payer: Self-pay | Admitting: Physician Assistant

## 2019-02-05 ENCOUNTER — Encounter: Payer: Self-pay | Admitting: Physician Assistant

## 2019-02-05 LAB — BASIC METABOLIC PANEL WITH GFR
BUN/Creatinine Ratio: 35 — ABNORMAL HIGH (ref 9–20)
BUN: 31 mg/dL — ABNORMAL HIGH (ref 6–24)
CO2: 30 mmol/L — ABNORMAL HIGH (ref 20–29)
Calcium: 8 mg/dL — ABNORMAL LOW (ref 8.7–10.2)
Chloride: 87 mmol/L — ABNORMAL LOW (ref 96–106)
Creatinine, Ser: 0.88 mg/dL (ref 0.76–1.27)
GFR calc Af Amer: 110 mL/min/{1.73_m2}
GFR calc non Af Amer: 95 mL/min/{1.73_m2}
Glucose: 65 mg/dL (ref 65–99)
Potassium: 2.7 mmol/L — CL (ref 3.5–5.2)
Sodium: 133 mmol/L — ABNORMAL LOW (ref 134–144)

## 2019-02-05 NOTE — Telephone Encounter (Signed)
Called patient regarding low potassium (2.7) and spoke with patient's wife. She was verbally abusive and let not letting Dr. Patrice Paradise speak even though I could hear his voice in back. She said he is not available. She want someone else to call back. Will update Almyra Deforest who saw the patient yesterday.

## 2019-02-05 NOTE — Telephone Encounter (Signed)
Called by [employer] to report a critical value of K: 2.7.  Will alert P.A. Meng and arrange for patient to be contacted early this morning with instructions.   Hayes Czaja K. Marletta Lor, MD

## 2019-02-06 NOTE — Telephone Encounter (Signed)
Covering for [employer], [place]. Thank you for the information.

## 2019-02-08 ENCOUNTER — Ambulatory Visit (INDEPENDENT_AMBULATORY_CARE_PROVIDER_SITE_OTHER): Payer: Medicare HMO | Admitting: Cardiology

## 2019-02-08 ENCOUNTER — Telehealth: Payer: Self-pay | Admitting: Physician Assistant

## 2019-02-08 ENCOUNTER — Other Ambulatory Visit: Payer: Self-pay

## 2019-02-08 DIAGNOSIS — Z5181 Encounter for therapeutic drug level monitoring: Secondary | ICD-10-CM | POA: Diagnosis not present

## 2019-02-08 DIAGNOSIS — Z954 Presence of other heart-valve replacement: Secondary | ICD-10-CM | POA: Diagnosis not present

## 2019-02-08 DIAGNOSIS — Z79899 Other long term (current) drug therapy: Secondary | ICD-10-CM

## 2019-02-08 DIAGNOSIS — I5033 Acute on chronic diastolic (congestive) heart failure: Secondary | ICD-10-CM

## 2019-02-08 DIAGNOSIS — I482 Chronic atrial fibrillation, unspecified: Secondary | ICD-10-CM

## 2019-02-08 LAB — POCT INR: INR: 4.7 — AB (ref 2.0–3.0)

## 2019-02-08 MED ORDER — POTASSIUM CHLORIDE ER 10 MEQ PO TBCR: EXTENDED_RELEASE_TABLET | ORAL | 5 refills | Status: DC

## 2019-02-08 NOTE — Telephone Encounter (Signed)
I am covering Hao's box for the next two weeks.   This is a difficult situation. Will need to see the labs once home health draws them. He needs to remove any salt from his diet, keep his legs elevated as much as possible. Continue the metolazone 2.5 but take so daily for 3 days and then back to every other day. He may need to be seen again if weight keeps going up. May need to have referral to lymphedema clinic through vein and vascular for other options he will not use compression device.

## 2019-02-08 NOTE — Telephone Encounter (Signed)
Returned call to Amy with [facility]. He was 199lbs when she started caring for him on 4/24 Patient is 210lbs today and was 205lbs at last visit on 6/5 and 206 on 6/6 Patient has NOT had meds today - HR 56 irregular, BP 104/56 - comparable VS to 6/5 visit Patient denies dizziness, feels washed out EMS has had to be called twice in last week for falls - did not hit head He has continued leg swelling, weeping - can't use his compression pump (too cumbersome)  Per last labs:  Notes recorded by Almyra Deforest, PA on 02/05/2019 at 12:01 PM EDT I have personally called the patient and his wife regarding abnormal lab result. I will increase his Potassium 71meq to 4 tablets in the AM, 2 tablets around lunch and 4 tablets in PM. He has been instructed to hold tonight's dose and tomorrow AM's dose of lasix and restart lasix tomorrow night. I asked him to have repeat lab done on Monday or Tue, he prefers to go to our [address] office, I will call [employer] office on Monday morning to arrange the lab and give him a call afterward. Otherwise, kidney function is stable.  Patient did not get his labs repeated so [facility] nurse will check this and a CBC d/t weakness and on anticoagulation therapy.   Routed to MD & PA

## 2019-02-08 NOTE — Telephone Encounter (Signed)
Called Amy and provided recommendations. She will communicate with patient. She states labs will be in by tomorrow. Will leave this message in triage for review once received.

## 2019-02-08 NOTE — Telephone Encounter (Signed)
New message:    Amy from [facility] would like for someone to call her concering this patient. Patient BP is low and HR low and she states the patient has not yet took any medication today. Please call Amy at [phone].

## 2019-02-10 ENCOUNTER — Encounter: Payer: Self-pay | Admitting: *Deleted

## 2019-02-10 NOTE — Telephone Encounter (Signed)
Labs received and given to dr hochrein to review

## 2019-02-13 ENCOUNTER — Inpatient Hospital Stay (HOSPITAL_COMMUNITY)
Admission: EM | Admit: 2019-02-13 | Discharge: 2019-02-27 | DRG: 292 | Disposition: A | Discharge status: Home Health | Payer: Medicare HMO | Attending: Internal Medicine | Admitting: Internal Medicine

## 2019-02-13 ENCOUNTER — Other Ambulatory Visit: Payer: Self-pay

## 2019-02-13 ENCOUNTER — Emergency Department (HOSPITAL_COMMUNITY): Payer: Medicare HMO

## 2019-02-13 ENCOUNTER — Encounter (HOSPITAL_COMMUNITY): Payer: Self-pay | Admitting: Emergency Medicine

## 2019-02-13 DIAGNOSIS — I081 Rheumatic disorders of both mitral and tricuspid valves: Secondary | ICD-10-CM | POA: Diagnosis present

## 2019-02-13 DIAGNOSIS — Z79899 Other long term (current) drug therapy: Secondary | ICD-10-CM

## 2019-02-13 DIAGNOSIS — D509 Iron deficiency anemia, unspecified: Secondary | ICD-10-CM | POA: Diagnosis present

## 2019-02-13 DIAGNOSIS — K219 Gastro-esophageal reflux disease without esophagitis: Secondary | ICD-10-CM | POA: Diagnosis present

## 2019-02-13 DIAGNOSIS — M1990 Unspecified osteoarthritis, unspecified site: Secondary | ICD-10-CM | POA: Diagnosis present

## 2019-02-13 DIAGNOSIS — N529 Male erectile dysfunction, unspecified: Secondary | ICD-10-CM | POA: Diagnosis present

## 2019-02-13 DIAGNOSIS — Z1159 Encounter for screening for other viral diseases: Secondary | ICD-10-CM

## 2019-02-13 DIAGNOSIS — Z880 Allergy status to penicillin: Secondary | ICD-10-CM

## 2019-02-13 DIAGNOSIS — Z95828 Presence of other vascular implants and grafts: Secondary | ICD-10-CM | POA: Diagnosis not present

## 2019-02-13 DIAGNOSIS — I471 Supraventricular tachycardia: Secondary | ICD-10-CM | POA: Diagnosis not present

## 2019-02-13 DIAGNOSIS — E871 Hypo-osmolality and hyponatremia: Secondary | ICD-10-CM | POA: Diagnosis present

## 2019-02-13 DIAGNOSIS — Z952 Presence of prosthetic heart valve: Secondary | ICD-10-CM | POA: Diagnosis not present

## 2019-02-13 DIAGNOSIS — I472 Ventricular tachycardia: Secondary | ICD-10-CM | POA: Diagnosis present

## 2019-02-13 DIAGNOSIS — I5033 Acute on chronic diastolic (congestive) heart failure: Secondary | ICD-10-CM | POA: Diagnosis present

## 2019-02-13 DIAGNOSIS — H548 Legal blindness, as defined in USA: Secondary | ICD-10-CM | POA: Diagnosis present

## 2019-02-13 DIAGNOSIS — I252 Old myocardial infarction: Secondary | ICD-10-CM

## 2019-02-13 DIAGNOSIS — R600 Localized edema: Secondary | ICD-10-CM | POA: Diagnosis present

## 2019-02-13 DIAGNOSIS — Z86010 Personal history of colonic polyps: Secondary | ICD-10-CM

## 2019-02-13 DIAGNOSIS — Z87891 Personal history of nicotine dependence: Secondary | ICD-10-CM

## 2019-02-13 DIAGNOSIS — E1151 Type 2 diabetes mellitus with diabetic peripheral angiopathy without gangrene: Secondary | ICD-10-CM | POA: Diagnosis present

## 2019-02-13 DIAGNOSIS — M545 Low back pain: Secondary | ICD-10-CM | POA: Diagnosis present

## 2019-02-13 DIAGNOSIS — Z7901 Long term (current) use of anticoagulants: Secondary | ICD-10-CM | POA: Diagnosis not present

## 2019-02-13 DIAGNOSIS — I509 Heart failure, unspecified: Secondary | ICD-10-CM | POA: Diagnosis not present

## 2019-02-13 DIAGNOSIS — I87309 Chronic venous hypertension (idiopathic) without complications of unspecified lower extremity: Secondary | ICD-10-CM | POA: Diagnosis present

## 2019-02-13 DIAGNOSIS — R011 Cardiac murmur, unspecified: Secondary | ICD-10-CM | POA: Diagnosis not present

## 2019-02-13 DIAGNOSIS — G8929 Other chronic pain: Secondary | ICD-10-CM | POA: Diagnosis not present

## 2019-02-13 DIAGNOSIS — Z7982 Long term (current) use of aspirin: Secondary | ICD-10-CM | POA: Diagnosis not present

## 2019-02-13 DIAGNOSIS — I872 Venous insufficiency (chronic) (peripheral): Secondary | ICD-10-CM | POA: Diagnosis present

## 2019-02-13 DIAGNOSIS — I4821 Permanent atrial fibrillation: Secondary | ICD-10-CM | POA: Diagnosis present

## 2019-02-13 DIAGNOSIS — G4733 Obstructive sleep apnea (adult) (pediatric): Secondary | ICD-10-CM | POA: Diagnosis present

## 2019-02-13 DIAGNOSIS — F419 Anxiety disorder, unspecified: Secondary | ICD-10-CM | POA: Diagnosis present

## 2019-02-13 DIAGNOSIS — R350 Frequency of micturition: Secondary | ICD-10-CM | POA: Diagnosis present

## 2019-02-13 DIAGNOSIS — Z8547 Personal history of malignant neoplasm of testis: Secondary | ICD-10-CM

## 2019-02-13 DIAGNOSIS — I2510 Atherosclerotic heart disease of native coronary artery without angina pectoris: Secondary | ICD-10-CM | POA: Diagnosis present

## 2019-02-13 DIAGNOSIS — R791 Abnormal coagulation profile: Secondary | ICD-10-CM | POA: Diagnosis present

## 2019-02-13 DIAGNOSIS — Z923 Personal history of irradiation: Secondary | ICD-10-CM

## 2019-02-13 DIAGNOSIS — E860 Dehydration: Secondary | ICD-10-CM | POA: Diagnosis not present

## 2019-02-13 DIAGNOSIS — E876 Hypokalemia: Secondary | ICD-10-CM | POA: Diagnosis present

## 2019-02-13 DIAGNOSIS — Z79891 Long term (current) use of opiate analgesic: Secondary | ICD-10-CM | POA: Diagnosis not present

## 2019-02-13 DIAGNOSIS — E291 Testicular hypofunction: Secondary | ICD-10-CM | POA: Diagnosis present

## 2019-02-13 DIAGNOSIS — E878 Other disorders of electrolyte and fluid balance, not elsewhere classified: Secondary | ICD-10-CM | POA: Diagnosis not present

## 2019-02-13 DIAGNOSIS — Z8249 Family history of ischemic heart disease and other diseases of the circulatory system: Secondary | ICD-10-CM

## 2019-02-13 DIAGNOSIS — Z833 Family history of diabetes mellitus: Secondary | ICD-10-CM

## 2019-02-13 DIAGNOSIS — Z9111 Patient's noncompliance with dietary regimen: Secondary | ICD-10-CM | POA: Diagnosis not present

## 2019-02-13 DIAGNOSIS — E875 Hyperkalemia: Secondary | ICD-10-CM | POA: Diagnosis present

## 2019-02-13 DIAGNOSIS — I482 Chronic atrial fibrillation, unspecified: Secondary | ICD-10-CM | POA: Diagnosis not present

## 2019-02-13 DIAGNOSIS — R601 Generalized edema: Secondary | ICD-10-CM

## 2019-02-13 DIAGNOSIS — Z9049 Acquired absence of other specified parts of digestive tract: Secondary | ICD-10-CM

## 2019-02-13 DIAGNOSIS — E785 Hyperlipidemia, unspecified: Secondary | ICD-10-CM | POA: Diagnosis present

## 2019-02-13 DIAGNOSIS — I447 Left bundle-branch block, unspecified: Secondary | ICD-10-CM | POA: Diagnosis present

## 2019-02-13 DIAGNOSIS — G894 Chronic pain syndrome: Secondary | ICD-10-CM | POA: Diagnosis present

## 2019-02-13 DIAGNOSIS — Z9079 Acquired absence of other genital organ(s): Secondary | ICD-10-CM

## 2019-02-13 DIAGNOSIS — E11649 Type 2 diabetes mellitus with hypoglycemia without coma: Secondary | ICD-10-CM | POA: Diagnosis not present

## 2019-02-13 DIAGNOSIS — D638 Anemia in other chronic diseases classified elsewhere: Secondary | ICD-10-CM | POA: Diagnosis present

## 2019-02-13 DIAGNOSIS — Z789 Other specified health status: Secondary | ICD-10-CM

## 2019-02-13 DIAGNOSIS — Z882 Allergy status to sulfonamides status: Secondary | ICD-10-CM

## 2019-02-13 DIAGNOSIS — Z951 Presence of aortocoronary bypass graft: Secondary | ICD-10-CM

## 2019-02-13 DIAGNOSIS — I110 Hypertensive heart disease with heart failure: Secondary | ICD-10-CM | POA: Diagnosis present

## 2019-02-13 DIAGNOSIS — I77810 Thoracic aortic ectasia: Secondary | ICD-10-CM | POA: Diagnosis present

## 2019-02-13 DIAGNOSIS — L8990 Pressure ulcer of unspecified site, unspecified stage: Secondary | ICD-10-CM | POA: Insufficient documentation

## 2019-02-13 DIAGNOSIS — Z823 Family history of stroke: Secondary | ICD-10-CM

## 2019-02-13 DIAGNOSIS — Z9109 Other allergy status, other than to drugs and biological substances: Secondary | ICD-10-CM

## 2019-02-13 DIAGNOSIS — Z8673 Personal history of transient ischemic attack (TIA), and cerebral infarction without residual deficits: Secondary | ICD-10-CM

## 2019-02-13 DIAGNOSIS — I5030 Unspecified diastolic (congestive) heart failure: Secondary | ICD-10-CM

## 2019-02-13 LAB — BASIC METABOLIC PANEL
Anion gap: 7 (ref 5–15)
BUN: 41 mg/dL — ABNORMAL HIGH (ref 6–20)
CO2: 30 mmol/L (ref 22–32)
Calcium: 7.6 mg/dL — ABNORMAL LOW (ref 8.9–10.3)
Chloride: 89 mmol/L — ABNORMAL LOW (ref 98–111)
Creatinine, Ser: 1 mg/dL (ref 0.61–1.24)
GFR calc Af Amer: >60 mL/min (ref >60)
GFR calc non Af Amer: >60 mL/min (ref >60)
Glucose, Bld: 97 mg/dL (ref 70–99)
Potassium: 5.3 mmol/L — ABNORMAL HIGH (ref 3.5–5.1)
Sodium: 126 mmol/L — ABNORMAL LOW (ref 135–145)

## 2019-02-13 LAB — CBC
HCT: 35.3 % — ABNORMAL LOW (ref 39.0–52.0)
Hemoglobin: 9.9 g/dL — ABNORMAL LOW (ref 13.0–17.0)
MCH: 19.6 pg — ABNORMAL LOW (ref 26.0–34.0)
MCHC: 28 g/dL — ABNORMAL LOW (ref 30.0–36.0)
MCV: 70 fL — ABNORMAL LOW (ref 80.0–100.0)
Platelets: 219 10*3/uL (ref 150–400)
RBC: 5.04 MIL/uL (ref 4.22–5.81)
RDW: 19.9 % — ABNORMAL HIGH (ref 11.5–15.5)
WBC: 13.8 10*3/uL — ABNORMAL HIGH (ref 4.0–10.5)
nRBC: 0 % (ref 0.0–0.2)

## 2019-02-13 LAB — IRON AND TIBC
Iron: 15 ug/dL — ABNORMAL LOW (ref 45–182)
Saturation Ratios: 5 % — ABNORMAL LOW (ref 17.9–39.5)
TIBC: 305 ug/dL (ref 250–450)
UIBC: 290 ug/dL

## 2019-02-13 LAB — SARS CORONAVIRUS 2: SARS Coronavirus 2: NOT DETECTED

## 2019-02-13 LAB — GLUCOSE, CAPILLARY
Glucose-Capillary: 61 mg/dL — ABNORMAL LOW (ref 70–99)
Glucose-Capillary: 73 mg/dL (ref 70–99)

## 2019-02-13 LAB — BRAIN NATRIURETIC PEPTIDE: B Natriuretic Peptide: 223.8 pg/mL — ABNORMAL HIGH (ref 0.0–100.0)

## 2019-02-13 LAB — OSMOLALITY: Osmolality: 277 mOsm/kg (ref 275–295)

## 2019-02-13 LAB — PROTIME-INR
INR: 2.6 — ABNORMAL HIGH (ref 0.8–1.2)
Prothrombin Time: 27.1 seconds — ABNORMAL HIGH (ref 11.4–15.2)

## 2019-02-13 LAB — DIGOXIN LEVEL: Digoxin Level: 2.6 ng/mL (ref 0.8–2.0)

## 2019-02-13 LAB — FERRITIN: Ferritin: 30 ng/mL (ref 24–336)

## 2019-02-13 MED ORDER — ACETAMINOPHEN 650 MG RE SUPP: 650.0000 mg | Freq: Four times a day (QID) | RECTAL | Status: DC | PRN

## 2019-02-13 MED ORDER — WARFARIN SODIUM 5 MG PO TABS
5.0000 mg | ORAL_TABLET | Freq: Once | ORAL | Status: AC
  Administered 2019-02-13: 5 mg via ORAL
  Filled 2019-02-13 (×2): qty 1

## 2019-02-13 MED ORDER — ACETAMINOPHEN 325 MG PO TABS
650.0000 mg | ORAL_TABLET | Freq: Four times a day (QID) | ORAL | Status: DC | PRN
  Administered 2019-02-15 – 2019-02-18 (×2): 650 mg via ORAL
  Filled 2019-02-13 (×2): qty 2

## 2019-02-13 MED ORDER — SODIUM CHLORIDE 0.9 % IV SOLN
INTRAVENOUS | Status: DC | PRN
  Administered 2019-02-13 – 2019-02-16 (×2): 1000 mL via INTRAVENOUS

## 2019-02-13 MED ORDER — WARFARIN - PHARMACIST DOSING INPATIENT
Freq: Every day | Status: DC
  Administered 2019-02-17 – 2019-02-24 (×4)

## 2019-02-13 MED ORDER — SODIUM CHLORIDE 0.9% FLUSH
3.0000 mL | Freq: Once | INTRAVENOUS | Status: AC
  Administered 2019-02-13: 3 mL via INTRAVENOUS

## 2019-02-13 MED ORDER — OXYCODONE HCL 5 MG PO TABS
5.0000 mg | ORAL_TABLET | Freq: Three times a day (TID) | ORAL | Status: DC | PRN
  Administered 2019-02-13: 5 mg via ORAL
  Filled 2019-02-13: qty 1

## 2019-02-13 MED ORDER — GABAPENTIN 100 MG PO CAPS
200.0000 mg | ORAL_CAPSULE | Freq: Three times a day (TID) | ORAL | Status: DC
  Administered 2019-02-13 – 2019-02-18 (×15): 200 mg via ORAL
  Filled 2019-02-13 (×15): qty 2

## 2019-02-13 MED ORDER — FUROSEMIDE 10 MG/ML IJ SOLN
120.0000 mg | Freq: Once | INTRAVENOUS | Status: AC
  Administered 2019-02-13: 120 mg via INTRAVENOUS
  Filled 2019-02-13: qty 10

## 2019-02-13 MED ORDER — INSULIN ASPART 100 UNIT/ML ~~LOC~~ SOLN: 0.0000 [IU] | Freq: Three times a day (TID) | SUBCUTANEOUS | Status: DC

## 2019-02-13 MED ORDER — PROMETHAZINE HCL 25 MG PO TABS: 12.5000 mg | ORAL_TABLET | Freq: Four times a day (QID) | ORAL | Status: DC | PRN

## 2019-02-13 MED ORDER — SENNOSIDES-DOCUSATE SODIUM 8.6-50 MG PO TABS: 1.0000 | ORAL_TABLET | Freq: Every evening | ORAL | Status: DC | PRN

## 2019-02-13 MED ORDER — ENOXAPARIN SODIUM 40 MG/0.4ML ~~LOC~~ SOLN: 40.0000 mg | SUBCUTANEOUS | Status: DC

## 2019-02-13 MED ORDER — METOLAZONE 2.5 MG PO TABS: 2.5000 mg | ORAL_TABLET | ORAL | Status: DC

## 2019-02-13 MED ORDER — FUROSEMIDE 10 MG/ML IJ SOLN
60.0000 mg | Freq: Once | INTRAMUSCULAR | Status: AC
  Administered 2019-02-13: 60 mg via INTRAVENOUS
  Filled 2019-02-13: qty 6

## 2019-02-13 NOTE — Progress Notes (Addendum)
ANTICOAGULATION CONSULT NOTE - Initial Consult  Pharmacy Consult for warfarin Indication: mechanical valve   Patient Measurements: Height: 5\' 1"  (154.9 cm) Weight: 218 lb (98.9 kg) IBW/kg (Calculated) : 52.3   Vital Signs: Temp: 97.8 F (36.6 C) (06/14 1451) Temp Source: Oral (06/14 1451) BP: 114/51 (06/14 1837) Pulse Rate: 70 (06/14 1837)  Labs: Recent Labs    02/13/19 1512  HGB 9.9*  HCT 35.3*  PLT 219  LABPROT 27.1*  INR 2.6*  CREATININE 1.00     Assessment: From home admitted with fluid buildup on lower legs. On warfarin prior to admission for history of a mechanical valve. Current INR is therapeutic at 2.6. PTA warfarin dose: 2.5 mg on Mondays, 5 mg on all other days. Though, patient recently had a supratherapeutic INR on this dose and was instructed to take 2.5 mg on Mon and Wed just for one week. Would consider giving lower dose on Wednesday if admitted.  Goal of Therapy:  INR goal: 2.5-3.5 Monitor platelets by anticoagulation protocol: Yes    Plan:  -warfarin 5 mg po x1 -daily INR   Harvel Quale 02/13/2019,7:45 PM

## 2019-02-13 NOTE — ED Notes (Signed)
ED TO INPATIENT HANDOFF REPORT  ED Nurse Name and Phone #: [phone]  S Name/Age/Gender Lance Cook 58 y.o. male Room/Bed: 028C/028C  Code Status   Code Status: Full Code  Home/SNF/Other Home Patient oriented to: self, place, time and situation Is this baseline? Yes   Triage Complete: Triage complete  Chief Complaint Fluid retention  Triage Note To ed via [facility] from home with c/o fluid overload - building for "a couple weeks" -- has been gaining weight also.  Lower legs swollen, edema to groin area also.    Allergies Allergies  Allergen Reactions  . Penicillins Hives    Has patient had a PCN reaction causing immediate rash, facial/tongue/throat swelling, SOB or lightheadedness with hypotension: Yes Has patient had a PCN reaction causing severe rash involving mucus membranes or skin necrosis: Yes Has patient had a PCN reaction that required hospitalization unsure Has patient had a PCN reaction occurring within the last 10 years: 2010-2012 If all of the above answers are "NO", then may proceed with Cephalosporin use.  . Adhesive [Tape] Other (See Comments)    Skin irritation  . Bactrim [Sulfamethoxazole-Trimethoprim] Other (See Comments)    CDIFF, when taking oral tablets    Level of Care/Admitting Diagnosis ED Disposition    ED Disposition Condition [facility] Area: [place] [[id number]]  Level of Care: Telemetry Medical [104]  Covid Evaluation: Screening Protocol (No Symptoms)  Diagnosis: Acute on chronic heart failure [facility]) [[id number]]  Admitting Physician: Aldine Contes [phone]  Attending Physician: Aldine Contes [phone]  Estimated length of stay: past midnight tomorrow  Certification:: I certify this patient will need inpatient services for at least 2 midnights  PT Class (Do Not Modify): Inpatient [101]  PT Acc Code (Do Not Modify): Private [1]       B Medical/Surgery History Past Medical History:  Diagnosis Date  .  Acute on chronic diastolic CHF (congestive heart failure) ([place]) 08/27/2016  . Anemia   . Anxiety   . ARDS (adult respiratory distress syndrome) ([place]) 01/27/2015  . Arthritis   . Ascending aortic dissection ([place]) 07/14/2008   Localized dissection of ascending aorta noted on CTA in 2009 and stable on CTA in 2011  . Asymptomatic chronic venous hypertension 01/15/2010   Overview:  Overview:  Qualifier: Diagnosis of  By: [name] MD, [name]   Last Assessment & Plan:  I suggested he go to a vein clinic to see if he could be qualified compression hose of some type, since he is not a surgical candidate according to the vascular surgeons.   . Atrial fibrillation ([place])    chronic persistent  . Bell's palsy   . C. difficile diarrhea   . CAD (coronary artery disease)    Old scar inferior wall myoview, 10/2009 EF 52%.  He did have previous SVG to RCA but no obstructive disease noted on his most recent cath.  SVG occluded.   . Cellulitis 04/02/2014  . Cerebral artery occlusion with cerebral infarction ([place]) 10/08/2011   Overview:  Overview:  And hx of TIA prior to CABG, all thought due to systemic emboli prior to coumadin   . Chronic diastolic heart failure ([place]) 03/01/2015   a. 12/2015: echo showing a preserved EF of 65-70%, moderate AS, and moderate TR.   Marland Kitchen Chronic LBP 10/08/2011  . CVA (cerebral vascular accident) ([place]) 10/08/2011   And hx of TIA prior to CABG, all thought due to systemic emboli prior to coumadin   . DIABETES MELLITUS, TYPE  II 11/01/2009   Qualifier: Diagnosis of  By: [name] MD, [name]    . Diverticulosis   . 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 Encephalopathy acute 02/05/2015  . Gastric polyps   . GERD (gastroesophageal reflux disease)    not needing medication at thhis time- 01/22/15  . Gout   . H/O mechanical aortic valve replacement 11/01/2009    Qualifier: Diagnosis of  By: [name], [name]    . Hiatal hernia   . History of colon polyps 12/23/2011   Overview:  Overview:  Colonoscopy January 2010, 6 mm rectal tubulovillous adenoma. No high-grade dysplasia   . HTN (hypertension) 02/23/2014  . Hyperlipidemia   . Hypertension   . Hypogonadism male 04/08/2012  . Impaired glucose tolerance 10/08/2011  . Morbid obesity ([place]) 04/02/2014  . Myocardial infarction [facility]) age 89  . OSA (obstructive sleep apnea)    CPAP  . Peripheral vascular disease ([place])   . 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 7/61/9509   33 mm St Jude bileaflet mechanical valve placed via right mini thoracotomy approach  . 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 in [place], [place]  . Severe mitral regurgitation 10/11/2014  . Testicular cancer [facility])    He was 58 y/o. He had surgical resection and rad tx's.   Marland Kitchen TIA (transient ischemic attack)    age 84  . Tubulovillous adenoma of colon   . Varicose veins   . 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 Venous (peripheral) insufficiency 02/03/2005   Past Surgical History:  Procedure Laterality Date  . aortic truck    . BENTALL PROCEDURE  1988   25 mm St Jude mechanical valve conduit - Dr Blase Mess at [facility] in [place], [place]  . BIOPSY  06/01/2018   Procedure: BIOPSY;  Surgeon: Jerene Bears, MD;  Location: [facility] ENDOS[facility];  Service: Gastroenterology;;  . CARDIAC VALVE REPLACEMENT     [phone]  . CHOLECYSTECTOMY  2011  . COLONOSCOPY WITH PROPOFOL N/A 06/17/2016   Procedure: COLONOSCOPY WITH PROPOFOL;  Surgeon: Jerene Bears, MD;  Location: [facility];  Service: Gastroenterology;  Laterality: N/A;  . COLONOSCOPY WITH PROPOFOL N/A 06/01/2018   Procedure: COLONOSCOPY WITH PROPOFOL;  Surgeon: Jerene Bears, MD;  Location: [facility];  Service: Gastroenterology;  Laterality: N/A;  .  CORONARY ARTERY BYPASS GRAFT    . LASER ABLATION Left 03/06/2010   leg  . MITRAL VALVE REPLACEMENT Right 01/24/2015   Procedure: Re-Operation, MINIMALLY INVASIVE MITRAL VALVE (MV) REPLACEMENT.;  Surgeon: Rexene Alberts, MD;  Location: [place];  Service: Open Heart Surgery;  Laterality: Right;  . ORCHIECTOMY  age 48   testicular cancer  . OTOPLASTY     bilateral, age 89  . POLYPECTOMY  06/01/2018   Procedure: POLYPECTOMY;  Surgeon: Jerene Bears, MD;  Location: [facility];  Service: Gastroenterology;;  . TEE WITHOUT CARDIOVERSION N/A 10/11/2014   Procedure: TRANSESOPHAGEAL ECHOCARDIOGRAM (TEE);  Surgeon: Pixie Casino, MD;  Location: [facility] ENDOSCOPY;  Service: Cardiovascular;  Laterality: N/A;  . TEE WITHOUT CARDIOVERSION N/A 01/24/2015   Procedure: TRANSESOPHAGEAL ECHOCARDIOGRAM (TEE);  Surgeon: Rexene Alberts, MD;  Location: [place];  Service: Open Heart Surgery;  Laterality: N/A;  . TONSILLECTOMY  1967  . TOOTH  EXTRACTION  2016     A IV Location/Drains/Wounds Patient Lines/Drains/Airways Status   Active Line/Drains/Airways    Name:   Placement date:   Placement time:   Site:   Days:   Peripheral IV 02/13/19 Right Antecubital   02/13/19    1452    Antecubital   less than 1          Intake/Output Last 24 hours No intake or output data in the 24 hours ending 02/13/19 1915  Labs/Imaging Results for orders placed or performed during the hospital encounter of 02/13/19 (from the past 48 hour(s))  Brain natriuretic peptide     Status: Abnormal   Collection Time: 02/13/19  3:04 PM  Result Value Ref Range   B Natriuretic Peptide 223.8 (H) 0.0 - 100.0 pg/mL    Comment: Performed at [facility], [place] [address] [place], [place] [address]  Basic metabolic panel     Status: Abnormal   Collection Time: 02/13/19  3:12 PM  Result Value Ref Range   Sodium 126 (L) 135 - 145 mmol/L   Potassium 5.3 (H) 3.5 - 5.1 mmol/L   Chloride 89 (L) 98 - 111 mmol/L   CO2 30 22 - 32 mmol/L   Glucose,  Bld 97 70 - 99 mg/dL   BUN 41 (H) 6 - 20 mg/dL   Creatinine, Ser 1.00 0.61 - 1.24 mg/dL   Calcium 7.6 (L) 8.9 - 10.3 mg/dL   GFR calc non Af Amer >60 >60 mL/min   GFR calc Af Amer >60 >60 mL/min   Anion gap 7 5 - 15    Comment: Performed at [facility] [address], [place], [place] [address]  CBC     Status: Abnormal   Collection Time: 02/13/19  3:12 PM  Result Value Ref Range   WBC 13.8 (H) 4.0 - 10.5 K/uL   RBC 5.04 4.22 - 5.81 MIL/uL   Hemoglobin 9.9 (L) 13.0 - 17.0 g/dL   HCT 35.3 (L) 39.0 - 52.0 %   MCV 70.0 (L) 80.0 - 100.0 fL   MCH 19.6 (L) 26.0 - 34.0 pg   MCHC 28.0 (L) 30.0 - 36.0 g/dL   RDW 19.9 (H) 11.5 - 15.5 %   Platelets 219 150 - 400 K/uL   nRBC 0.0 0.0 - 0.2 %    Comment: Performed at [facility], [place] [address], [place], [place] [address]  Protime-INR (order if Patient is taking Coumadin / Warfarin)     Status: Abnormal   Collection Time: 02/13/19  3:12 PM  Result Value Ref Range   Prothrombin Time 27.1 (H) 11.4 - 15.2 seconds   INR 2.6 (H) 0.8 - 1.2    Comment: (NOTE) INR goal varies based on device and disease states. Performed at [facility], [place] [address], [place], [place] [address]   Digoxin level     Status: Abnormal   Collection Time: 02/13/19  3:12 PM  Result Value Ref Range   Digoxin Level 2.6 (HH) 0.8 - 2.0 ng/mL    Comment: CRITICAL RESULT CALLED TO, READ BACK BY AND VERIFIED WITH: Laurin Coder, A RN @ 0300 ON 02/13/2019 BY TEMOCHE, H Performed at [facility], [place] [address] [place], [place] [address]    Dg Chest Portable 1 View  Result Date: 02/13/2019 CLINICAL DATA:  To ed via [facility] from home with c/o fluid overload - building for "a couple weeks" -- has been gaining weight also. Lower legs  swollen, edema to groin area also. EXAM: PORTABLE CHEST 1 VIEW COMPARISON:  01/14/2018 FINDINGS: Cardiac silhouette is mildly enlarged. There changes from cardiac surgery and valve replacement, stable. No mediastinal or hilar  masses. No convincing adenopathy. Bilateral prominent bronchovascular markings similar to the prior study. Suspect a small right pleural effusion. No lung consolidation. No pneumothorax. Skeletal structures are grossly intact. IMPRESSION: 1. No acute cardiopulmonary disease. No evidence pulmonary edema. Chronically prominent bronchovascular markings stable. 2. Stable mild cardiomegaly and changes from prior cardiac surgery. Electronically Signed   By: Lajean Manes M.D.   On: 02/13/2019 16:25    Pending Labs Unresulted Labs (From admission, onward)    Start     Ordered   02/14/19 0500  CBC  Tomorrow morning,   R     02/13/19 1856   02/14/19 5631  Basic metabolic panel  Tomorrow morning,   R     02/13/19 1856   02/13/19 1859  SARS Coronavirus 2 [facility] order, Performed in [facility] hospital lab)  (Novel Coronavirus, [facility] Order))  Once,   STAT     02/13/19 1859   02/13/19 1855  HIV antibody (Routine Testing)  Once,   STAT     02/13/19 1856   02/13/19 1855  Creatinine, serum  (enoxaparin (LOVENOX)    CrCl >/= 30 ml/min)  Once,   STAT    Comments: Baseline for enoxaparin therapy IF NOT ALREADY DRAWN.    02/13/19 1856          Vitals/Pain Today's Vitals   02/13/19 1455 02/13/19 1456 02/13/19 1600 02/13/19 1837  BP:   (!) 111/56 (!) 114/51  Pulse:   (!) 56 70  Resp:   (!) 22 19  Temp:      TempSrc:      SpO2:   100% 100%  Weight:  98.9 kg    Height:  5\' 1"  (1.549 m)    PainSc: 7        Isolation Precautions Droplet and Contact precautions  Medications Medications  enoxaparin (LOVENOX) injection 40 mg (has no administration in time range)  senna-docusate (Senokot-S) tablet 1 tablet (has no administration in time range)  promethazine (PHENERGAN) tablet 12.5 mg (has no administration in time range)  acetaminophen (TYLENOL) tablet 650 mg (has no administration in time range)    Or  acetaminophen (TYLENOL) suppository 650 mg (has no administration in time range)   furosemide (LASIX) 120 mg in dextrose 5 % 50 mL IVPB (has no administration in time range)  gabapentin (NEURONTIN) capsule 200-400 mg (has no administration in time range)  metolazone (ZAROXOLYN) tablet 2.5 mg (has no administration in time range)  insulin aspart (novoLOG) injection 0-15 Units (has no administration in time range)  sodium chloride flush (NS) 0.9 % injection 3 mL (3 mLs Intravenous Given 02/13/19 1545)  furosemide (LASIX) injection 60 mg (60 mg Intravenous Given 02/13/19 1843)    Mobility walks with person assist Moderate fall risk   Focused Assessments Cardiac Assessment Handoff:    Lab Results  Component Value Date   CKTOTAL 51 01/28/2018   CKMB 3.1 10/28/2009   TROPONINI <0.03 01/15/2018   No results found for: DDIMER Does the Patient currently have chest pain? No     R Recommendations: See Admitting Provider Note  Report given to:   Additional Notes:

## 2019-02-13 NOTE — Progress Notes (Addendum)
Pt has been admitted to [facility]. Pt is alert and oriented x4. Pt vitals are stable. Blood sugar is 61. Pt asked to have orange juices and an icy. Pt has abrasions on arms and legs, skin tear on his arms, legs and buttocks. Pt also has MASD around his back, groin and abdomen. Wound consult in place for pressure ulcer on bottom. Pt in no acute distress. Will continue to monitor pt.

## 2019-02-13 NOTE — H&P (Signed)
Date: 02/13/2019               Patient Name:  Lance Cook MRN: [id number]  DOB: [birth year] Age / Sex: 58 y.o., male   PCP: [name], MD         Medical Service: Internal Medicine Teaching Service         Attending Physician: Dr. Aldine Contes, MD    First Contact: Dr. Alfonse Spruce Pager: [phone]  Second Contact: Dr. Maricela Bo Pager: [phone]       After Hours (After 5p/  First Contact Pager: [phone]  weekends / holidays): Second Contact Pager: [phone]   Chief Complaint: Lower extremity and scrotal edema  History of Present Illness: Lance Cook is a 58 y.o male with hfpef, mechanical avr s/p bentall procedure 1988, severe mitral regurg s/p mechanical valve may 200[address], cad s/p svg-rca, chronic afib, ascending aortic dissection, diabetes mellitis, hypertension, osa on cpap, pvd, testicular cancer s/p rad tx and surgical resection who presented with worsening LE edema. History was obtained via the patient and chart review.   Patient states that approximately one month ago he weighed 189 pounds and this morning he weighed 219 pounds. At baseline he has lower extremity edema after failed phototherapy for venous insufficiency; however, has noticed increasing edema on his thighs. Over the last four weeks his Lasix and metolazone have stopped working. He feels that his body is now immune to them. He states that prior to this past month he was only on metolazone once weekly however lately he has been taking it daily. He feels that his body is now immune to them. He states that when he does take his medication he notices frequent urination but very small volume.  Prior to these last four weeks he was severely limited in his mobility but did service her primary caregiver for his disabled wife. Describe symptoms consistent with NYHA class III. Now describing symptoms consistent with NYHA class IV. In addition to his shortness of breath over the last 1 to 2 nights he has noticed increased chest pain  with exertion. Describes it as substernal in nature and relieved by rest.  He is compliant with all his medications. 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 States that he uses canned tomatoes to make his pasta sauce. He states that he typically sleeps with pillows and a wedge but has not noticed this is gotten any worse. He denies any recent fevers, chills, headaches, visual changes, cough, abdominal pain, nausea/vomiting, increased abdominal girth, new rash, dysuria.  Meds:  Current Meds  Medication Sig  . allopurinol (ZYLOPRIM) 300 MG tablet Take 300 mg by mouth daily.  Marland Kitchen atenolol (TENORMIN) 100 MG tablet Take 0.25 tablet (25 mg total) by mouth twice daily. (Patient taking differently: Take 25 mg by mouth 2 (two) times daily. )  . digoxin (LANOXIN) 0.25 MG tablet Take 1 tablet (250 mcg total) by mouth daily.  . furosemide (LASIX) 80 MG tablet Take 1 tablet (80 mg total) by mouth 2 (two) times daily.  Marland Kitchen gabapentin (NEURONTIN) 100 MG capsule Take 200-400 mg by mouth 3 (three) times daily. Dose is dependent on level of leg pain  . magnesium oxide (MAG-OX) 400 (241.3 MG) MG tablet Take 1 tablet (400 mg total) by mouth 2 (two) times daily.  . methocarbamol (ROBAXIN) 500 MG tablet Take 1 tablet (500 mg total) by mouth 3 (three) times daily. (Patient taking  differently: Take 500-1,000 mg by mouth 3 (three) times daily. Dose is dependent on level of back pain)  . metolazone (ZAROXOLYN) 2.5 MG tablet Take 1 tablet (2.5 mg total) by mouth every other day.  . mupirocin ointment (BACTROBAN) 2 % Apply 1 application topically 2 (two) times daily as needed (infection prevention).   Marland Kitchen oxyCODONE (OXY IR/ROXICODONE) 5 MG immediate release tablet Take 1 tablet (5 mg total) by mouth every 8 (eight) hours as needed for severe pain. (Patient taking differently: Take 5 mg by mouth 3 (three) times daily. )  . potassium chloride (K-DUR) 10 MEQ tablet Take  4 tablets in the morning, 2 tablets at lunch, 4 tablets in the evening. (Patient taking differently: Take 20-40 mEq by mouth See admin instructions. Take 4 tablets (40 meq) by mouth every morning, 2 tablets (20 meq) at lunch, 4 tablets (40 meq) every evening)  . PRESCRIPTION MEDICATION Inhale into the lungs at bedtime. CPAP  . silver sulfADIAZINE (SILVADENE) 1 % cream Apply 1 application topically daily as needed (wound care).  . traZODone (DESYREL) 100 MG tablet Take 100 mg by mouth at bedtime.   Marland Kitchen warfarin (COUMADIN) 5 MG tablet TAKE 1 TO 1 AND 1/2 TABLETS DAILY AS DIRECTED BY COUMADIN CLINIC (Patient taking differently: Take 5 mg by mouth every evening. Or as directed by coumadin clinic)   Allergies: Allergies as of 02/13/2019 - Review Complete 02/13/2019  Allergen Reaction Noted  . Penicillins Hives 09/24/2004  . Adhesive [tape] Other (See Comments) 01/22/2015  . Bactrim [sulfamethoxazole-trimethoprim] Other (See Comments) 05/08/2016   Past Medical History:  Diagnosis Date  . Acute on chronic diastolic CHF (congestive heart failure) ([place]) 08/27/2016  . Anemia   . Anxiety   . ARDS (adult respiratory distress syndrome) ([place]) 01/27/2015  . Arthritis   . Ascending aortic dissection ([place]) 07/14/2008   Localized dissection of ascending aorta noted on CTA in 2009 and stable on CTA in 2011  . Asymptomatic chronic venous hypertension 01/15/2010   Overview:  Overview:  Qualifier: Diagnosis of  By: [name] MD, [name]   Last Assessment & Plan:  I suggested he go to a vein clinic to see if he could be qualified compression hose of some type, since he is not a surgical candidate according to the vascular surgeons.   . Atrial fibrillation ([place])    chronic persistent  . Bell's palsy   . C. difficile diarrhea   . CAD (coronary artery disease)    Old scar inferior wall myoview, 10/2009 EF 52%.  He did have previous SVG to RCA but no obstructive disease noted on his most recent cath.  SVG occluded.    . Cellulitis 04/02/2014  . Cerebral artery occlusion with cerebral infarction ([place]) 10/08/2011   Overview:  Overview:  And hx of TIA prior to CABG, all thought due to systemic emboli prior to coumadin   . Chronic diastolic heart failure ([place]) 03/01/2015   a. 12/2015: echo showing a preserved EF of 65-70%, moderate AS, and moderate TR.   Marland Kitchen Chronic LBP 10/08/2011  . CVA (cerebral vascular accident) ([place]) 10/08/2011   And hx of TIA prior to CABG, all thought due to systemic emboli prior to coumadin   . DIABETES MELLITUS, TYPE II 11/01/2009   Qualifier: Diagnosis of  By: [name] MD, [name]    . Diverticulosis   . 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  Encephalopathy acute 02/05/2015  . Gastric polyps   . GERD (gastroesophageal reflux disease)    not needing medication at thhis time- 01/22/15  . Gout   . H/O mechanical aortic valve replacement 11/01/2009   Qualifier: Diagnosis of  By: [name], [name]    . Hiatal hernia   . History of colon polyps 12/23/2011   Overview:  Overview:  Colonoscopy January 2010, 6 mm rectal tubulovillous adenoma. No high-grade dysplasia   . HTN (hypertension) 02/23/2014  . Hyperlipidemia   . Hypertension   . Hypogonadism male 04/08/2012  . Impaired glucose tolerance 10/08/2011  . Morbid obesity ([place]) 04/02/2014  . Myocardial infarction [facility]) age 80  . OSA (obstructive sleep apnea)    CPAP  . Peripheral vascular disease ([place])   . 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 1/82/9937   33 mm St Jude bileaflet mechanical valve placed via right mini thoracotomy approach  . 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    1988 - Dr Blase Mess at [facility] in [place], [place]  . Severe mitral regurgitation 10/11/2014  . Testicular cancer [facility])    He was 58 y/o. He had surgical  resection and rad tx's.   Marland Kitchen TIA (transient ischemic attack)    age 29  . Tubulovillous adenoma of colon   . Varicose veins   . 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 Venous (peripheral) insufficiency 02/03/2005   Family History  Problem Relation Age of Onset  . Heart disease Mother   . Throat cancer Mother   . Other Mother        bowel obstruction  . Diabetes Father   . Stroke Other   . Hypertension Other   . Cancer Maternal Aunt        lung, brain  . Cancer Maternal Uncle        brain aneurysm  . Colon cancer Neg Hx    Social History: Previously worked as an Dietitian for 30 years. Now the primary caregiver for his disabled wife. States that she is legally blind. Denies the use of tobacco or alcohol. States that he used to use marijuana in his younger days but has not used any illicit substances recently.  Review of Systems: A complete ROS was negative except as per HPI.  Physical Exam: Blood pressure 117/65, pulse (!) 49, temperature 98.3 F (36.8 C), temperature source Oral, resp. rate 18, height 5\' 1"  (1.549 m), weight 99.3 kg, SpO2 (!) 85 %.   Repeat Oxygen saturation 100% on 6L/min [place]  General: Obese male in no acute distress HENT: Normocephalic, atraumatic, moist mucus membranes Pulm: Good air movement with no wheezing or crackles  CV: Bradycardic, normal rhythm, prominent systolic murmur, + JVD  Abdomen: Active bowel sounds, firm, non-distended, no tenderness to palpation  Extremities: Pulses palpable in all extremities, severe LE pitting edema up to the abdomen  Skin: Warm and dry  Neuro: Alert and oriented x 3  EKG: personally reviewed: my interpretation is atrial fibrillation with bradycardia. Left bundle branch block and down sloping ST segment in leads V4-V6. Findings stable compared to prior EKGs.  CXR: personally reviewed: my interpretation is good penetration before inspiration and rotation. Prior CABG and mitral valve replacement.  No other bony or soft tissue abnormalities. No pleural effusions noted. No opacities or infiltrates. Appears to have an enlarged cardiac silhouette. Does not seem to have significant pulmonary congestion when compared to prior films.  Assessment &  Plan by Problem: Active Problems:   Acute on chronic heart failure [facility])  Mr. Montanye is a 58 y.o male with HFpEF, mechanical avr s/p bentall procedure 1988, severe mitral regurg s/p mechanical valve may 200[address], cad s/p svg-rca, chronic afib, and ascending aortic dissection who presented to the ED with four weeks of progressive signs and symptoms of volume overload. He subsequently admitted for further evaluation and management.  Acute on Chronic HFpEF - Presenting with severe anasarca, feels warm to touch  - Last echo on 6/1 with LV EF of 55 to 60%, severely elevated right ventricular pressure, severely dilated right and left atrium, perivalvular leak of the mitral prosthesis, and dilated IVC with less than 50% expiratory variation - Dry weight based on cardiology notes is 189lbs  - Outpatient diuretics are Lasix 120 mg BID and Metolazone 2.5 mg every other day. IV Furosemide 120 mg BID, hold Metolazone to assess response  - Elevated digoxin level at 2.6 but no symptoms of toxicity. Will hold digoxin for now.  - Admit to tele  - Check magnesium and monitor electrolytes daily  - Strict I&Os  - Fluid restriction, <1200 mL per day  - IV iron for iron deficiency anemia once closer to euvolemia  - Consult cardiology in AM  Hx of mech avr and mech mvr  - INR is 2.6  - Goal INR 3.0  - Warfarin per pharmacy   Chronic Atrial Fibrillation  - Rate controlled on Atenolol 25 mg BID - Anticoagulated with Warfarin   Hypervolemic Hyponatremia  - Will improve with diuresis    Microcytic anemia  - Hgb at baseline of 9-10  - Elevated RDW, Iron sat ~5%  - Would benefit from IV iron once more euvolemic   Hyperkalemia  - Hx of hypokalemia with aggressive  diuresis  - Hold potassium supplements for now   OSA - CPAP QHS   DM  - SSI  - Check A1c   Chronic Pain  - Continue Oxycodone and Gabapentin   Diet: Heart healthy, fluid restriction  VTE ppx: On Warfarin  CODE STATUS: Full Code  Dispo: Admit patient to Inpatient with expected length of stay greater than 2 midnights.  SignedIna Homes, MD 02/13/2019, 11:15 PM  Pager: [phone]

## 2019-02-13 NOTE — ED Provider Notes (Signed)
[facility] EMERGENCY DEPARTMENT Provider Note   CSN: [id number] Arrival date & time: 02/13/19  1438     History   Chief Complaint Chief Complaint  Patient presents with  . fluid overload  . Congestive Heart Failure    HPI Lance Cook is a 58 y.o. male.     HPI Patient has complex medical history including congestive heart failure.  Patient reports he has been getting increased swelling in his extremities up to his abdomen.  Has been a chronic and recurrent problem but he reports that the swelling is worse now up to his thighs and genitals as well.  He reports he has a lot of difficulty moving due to the amount of swelling.  Reports he is increased his Lasix and metaxalone with no improvement. Past Medical History:  Diagnosis Date  . Acute on chronic diastolic CHF (congestive heart failure) ([place]) 08/27/2016  . Anemia   . Anxiety   . ARDS (adult respiratory distress syndrome) ([place]) 01/27/2015  . Arthritis   . Ascending aortic dissection ([place]) 07/14/2008   Localized dissection of ascending aorta noted on CTA in 2009 and stable on CTA in 2011  . Asymptomatic chronic venous hypertension 01/15/2010   Overview:  Overview:  Qualifier: Diagnosis of  By: [name] MD, [name]   Last Assessment & Plan:  I suggested he go to a vein clinic to see if he could be qualified compression hose of some type, since he is not a surgical candidate according to the vascular surgeons.   . Atrial fibrillation ([place])    chronic persistent  . Bell's palsy   . C. difficile diarrhea   . CAD (coronary artery disease)    Old scar inferior wall myoview, 10/2009 EF 52%.  He did have previous SVG to RCA but no obstructive disease noted on his most recent cath.  SVG occluded.   . Cellulitis 04/02/2014  . Cerebral artery occlusion with cerebral infarction ([place]) 10/08/2011   Overview:  Overview:  And hx of TIA prior to CABG, all thought due to systemic emboli prior to coumadin   . Chronic diastolic  heart failure ([place]) 03/01/2015   a. 12/2015: echo showing a preserved EF of 65-70%, moderate AS, and moderate TR.   Marland Kitchen Chronic LBP 10/08/2011  . CVA (cerebral vascular accident) ([place]) 10/08/2011   And hx of TIA prior to CABG, all thought due to systemic emboli prior to coumadin   . DIABETES MELLITUS, TYPE II 11/01/2009   Qualifier: Diagnosis of  By: [name] MD, [name]    . Diverticulosis   . 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 Encephalopathy acute 02/05/2015  . Gastric polyps   . GERD (gastroesophageal reflux disease)    not needing medication at thhis time- 01/22/15  . Gout   . H/O mechanical aortic valve replacement 11/01/2009   Qualifier: Diagnosis of  By: [name], [name]    . Hiatal hernia   . History of colon polyps 12/23/2011   Overview:  Overview:  Colonoscopy January 2010, 6 mm rectal tubulovillous adenoma. No high-grade dysplasia   . HTN (hypertension) 02/23/2014  . Hyperlipidemia   . Hypertension   . Hypogonadism male 04/08/2012  . Impaired glucose tolerance 10/08/2011  . Morbid obesity ([place]) 04/02/2014  . Myocardial infarction [facility]) age 31  . OSA (obstructive sleep apnea)    CPAP  . Peripheral vascular  disease ([place])   . 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 5/95/6387   33 mm St Jude bileaflet mechanical valve placed via right mini thoracotomy approach  . 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    1988 - Dr Blase Mess at [facility] in [place], [place]  . Severe mitral regurgitation 10/11/2014  . Testicular cancer [facility])    He was 58 y/o. He had surgical resection and rad tx's.   Marland Kitchen TIA (transient ischemic attack)    age 89  . Tubulovillous adenoma of colon   . Varicose veins   . 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 Venous (peripheral) insufficiency  02/03/2005    Patient Active Problem List   Diagnosis Date Noted  . Benign neoplasm of ascending colon   . Benign neoplasm of rectum   . Benign neoplasm of rectosigmoid junction   . Benign neoplasm of sigmoid colon   . DM type 2 with diabetic dyslipidemia ([place]) 04/26/2018  . Lower extremity edema 03/08/2018  . Other fatigue 01/28/2018  . Chronic anticoagulation 01/05/2018  . Insomnia 11/23/2017  . Unilateral vocal cord paralysis 11/30/2016  . Leg swelling 11/28/2016  . S/P MVR (mitral valve replacement)   . Acute on chronic diastolic CHF (congestive heart failure) ([place]) 08/27/2016  . Chronic pain syndrome 07/10/2016  . Chronic diarrhea   . Benign neoplasm of transverse colon   . Scrotal swelling 05/30/2016  . Musculoskeletal pain 01/07/2016  . Personal history of malignant neoplasm of testis 07/12/2015  . Long term current use of opiate analgesic 07/11/2015  . Long term prescription opiate use 07/11/2015  . Opiate use (22.5 MME/day) 07/11/2015  . Opiate dependence ([place]) 07/11/2015  . Encounter for therapeutic drug level monitoring 07/11/2015  . Chronic low back pain (midline) 07/11/2015  . Lumbar facet syndrome 07/11/2015  . Chronic lower extremity pain (Left) 07/11/2015  . Adult BMI 30+ 06/19/2015  . Obesity (BMI 30-39.9) 06/19/2015  . Hypomagnesemia 02/08/2015  . Physical deconditioning 02/08/2015  . Enteritis due to Clostridium difficile 01/30/2015  . 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 56/43/3295  . Chronic diastolic congestive heart failure ([place])   . Type 2 diabetes mellitus with other specified complication ([place]) 18/84/1660  . 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 Venous insufficiency of both lower extremities 04/02/2014  . Morbid obesity ([place]) 04/02/2014  . Type II or unspecified type diabetes mellitus with other specified manifestations, not stated as uncontrolled 03/14/2014  . Personal history  of transient ischemic attack (TIA), and cerebral infarction without residual deficits 02/23/2014  . Malignant neoplasm of testis ([place]) 02/23/2014  . H/O TIA (transient ischemic attack) and stroke 02/23/2014  . HTN (hypertension) 02/23/2014  . Lymphadenopathy 02/23/2014  . S/P AVR (aortic valve replacement) 02/23/2014  . Encounter for therapeutic drug monitoring 02/02/2014  . Long term current use of anticoagulant therapy 07/15/2012  . Hypogonadism male 04/08/2012  . 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 Testicular hypofunction 04/08/2012  . Male hypogonadism 04/08/2012  . Other testicular hypofunction 04/08/2012  . History of colonic polyps 12/23/2011  . Testicular cancer ([place])   . TIA (transient ischemic attack)   . OSA (obstructive sleep apnea)   . Lumbar spinal stenosis (Severe L4-5) 10/08/2011  . ED (erectile dysfunction) of organic origin 10/08/2011  . Lumbar canal stenosis 10/08/2011  . Low back pain 10/08/2011  . Spinal stenosis of lumbar region without neurogenic  claudication 10/08/2011  . CAD (coronary artery disease)   . Spondylosis 04/29/2010  . DYSLIPIDEMIA 01/15/2010  . Chronic venous hypertension with ulcer ([place]) 01/15/2010  . Essential hypertension 12/14/2009  . Gout 11/01/2009  . Anemia, iron deficiency 11/01/2009  . MYOCARDIAL INFARCTION, HX OF 11/01/2009  . Atrial fibrillation, chronic 11/01/2009  . PERIPHERAL EDEMA 11/01/2009  . Heart valve replaced by other means 11/01/2009  . Presence of other heart-valve replacement 11/01/2009  . Ascending aortic dissection ([place]) 07/14/2008  . 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 Venous (peripheral) insufficiency 02/03/2005    Past Surgical History:  Procedure Laterality Date  . aortic truck    . BENTALL PROCEDURE  1988   25 mm St Jude mechanical valve conduit - Dr Blase Mess at [facility] in [place], [place]  . BIOPSY  06/01/2018   Procedure: BIOPSY;  Surgeon: Jerene Bears, MD;  Location:  [facility] ENDOSCOPY;  Service: Gastroenterology;;  . CARDIAC VALVE REPLACEMENT     [phone]  . CHOLECYSTECTOMY  2011  . COLONOSCOPY WITH PROPOFOL N/A 06/17/2016   Procedure: COLONOSCOPY WITH PROPOFOL;  Surgeon: Jerene Bears, MD;  Location: [facility] ENDOSCOPY;  Service: Gastroenterology;  Laterality: N/A;  . COLONOSCOPY WITH PROPOFOL N/A 06/01/2018   Procedure: COLONOSCOPY WITH PROPOFOL;  Surgeon: Jerene Bears, MD;  Location: [facility] ENDOSCOPY;  Service: Gastroenterology;  Laterality: N/A;  . CORONARY ARTERY BYPASS GRAFT    . LASER ABLATION Left 03/06/2010   leg  . MITRAL VALVE REPLACEMENT Right 01/24/2015   Procedure: Re-Operation, MINIMALLY INVASIVE MITRAL VALVE (MV) REPLACEMENT.;  Surgeon: Rexene Alberts, MD;  Location: [facility];  Service: Open Heart Surgery;  Laterality: Right;  . ORCHIECTOMY  age 67   testicular cancer  . OTOPLASTY     bilateral, age 63  . POLYPECTOMY  06/01/2018   Procedure: POLYPECTOMY;  Surgeon: Jerene Bears, MD;  Location: [facility] ENDOSCOPY;  Service: Gastroenterology;;  . TEE WITHOUT CARDIOVERSION N/A 10/11/2014   Procedure: TRANSESOPHAGEAL ECHOCARDIOGRAM (TEE);  Surgeon: Pixie Casino, MD;  Location: [facility] ENDOSCOPY;  Service: Cardiovascular;  Laterality: N/A;  . TEE WITHOUT CARDIOVERSION N/A 01/24/2015   Procedure: TRANSESOPHAGEAL ECHOCARDIOGRAM (TEE);  Surgeon: Rexene Alberts, MD;  Location: [facility];  Service: Open Heart Surgery;  Laterality: N/A;  . TONSILLECTOMY  1967  . TOOTH EXTRACTION  2016        Home Medications    Prior to Admission medications   Medication Sig Start Date End Date Taking? Authorizing Provider  acetaminophen (TYLENOL) 500 MG tablet Take 500-1,000 mg by mouth daily as needed for moderate pain or headache.     [provider]  allopurinol (ZYLOPRIM) 300 MG tablet Take 300 mg by mouth daily. 11/17/12   Biagio Borg, MD  atenolol (TENORMIN) 100 MG tablet Take 0.25 tablet (25 mg total) by mouth twice daily. 11/18/18   [name], MD  digoxin  (LANOXIN) 0.25 MG tablet Take 1 tablet (250 mcg total) by mouth daily. 11/18/18   [name], MD  enoxaparin (LOVENOX) 100 MG/ML injection Inject 100 mg into the skin 2 (two) times daily. For procedures    [provider]  furosemide (LASIX) 80 MG tablet Take 1 tablet (80 mg total) by mouth 2 (two) times daily. 01/12/19   [name], MD  gabapentin (NEURONTIN) 100 MG capsule Take 100 mg by mouth 3 (three) times daily as needed. 09/07/18   [provider]  Homeopathic Products [facility] PINK EYE RELIEF) SOLN Place 1 drop into  both eyes daily as needed (irritation).    [provider]  magnesium oxide (MAG-OX) 400 (241.3 MG) MG tablet Take 1 tablet (400 mg total) by mouth 2 (two) times daily. 06/04/15   [name], MD  methocarbamol (ROBAXIN) 500 MG tablet Take 1 tablet (500 mg total) by mouth 3 (three) times daily. 07/30/18 02/04/19  Vevelyn Francois, NP  metolazone (ZAROXOLYN) 2.5 MG tablet Take 1 tablet (2.5 mg total) by mouth every other day. 02/04/19   Almyra Deforest, PA  mupirocin ointment (BACTROBAN) 2 % Apply 1 application topically 2 (two) times daily as needed (infection prevention).     [provider]  NON FORMULARY Apply 2 Pump topically 3 (three) times daily. LIDO5/DICLO3/CYCLO2/GABA 2    [provider]  oxyCODONE (OXY IR/ROXICODONE) 5 MG immediate release tablet Take 1 tablet (5 mg total) by mouth every 8 (eight) hours as needed for severe pain. 09/28/18 02/04/19  Vevelyn Francois, NP  Potassium 99 MG TABS Take 99-198 mg by mouth daily as needed (cramping).    [provider]  potassium chloride (K-DUR) 10 MEQ tablet Take 4 tablets in the morning, 2 tablets at lunch, 4 tablets in the evening. 02/08/19   Almyra Deforest, PA  traZODone (DESYREL) 100 MG tablet  01/24/19   [provider]  warfarin (COUMADIN) 5 MG tablet TAKE 1 TO 1 AND 1/2 TABLETS DAILY AS DIRECTED BY COUMADIN CLINIC 01/18/19   [name], MD    Family History Family  History  Problem Relation Age of Onset  . Heart disease Mother   . Throat cancer Mother   . Other Mother        bowel obstruction  . Diabetes Father   . Stroke Other   . Hypertension Other   . Cancer Maternal Aunt        lung, brain  . Cancer Maternal Uncle        brain aneurysm  . Colon cancer Neg Hx     Social History Social History   Tobacco Use  . Smoking status: Former Smoker    Types: Cigars  . Smokeless tobacco: Never Used  . Tobacco comment: about 3 yearly- cigar  Substance Use Topics  . Alcohol use: Yes    Alcohol/week: 0.0 standard drinks    Comment: social  . Drug use: No     Allergies   Penicillins, Adhesive [tape], and Bactrim [sulfamethoxazole-trimethoprim]   Review of Systems Review of Systems 10 Systems reviewed and are negative for acute change except as noted in the HPI.   Physical Exam Updated Vital Signs BP (!) 128/50 (BP Location: Left Arm)   Pulse (!) 56   Temp 97.8 F (36.6 C) (Oral)   Resp (!) 24   Ht 5\' 1"  (1.549 m)   Wt 98.9 kg   SpO2 100%   BMI 41.19 kg/m   Physical Exam Constitutional:      Comments: Alert and nontoxic.  Clear mental status.  Mild increased work of breathing at rest.  Morbid obesity.  HENT:     Head: Normocephalic and atraumatic.     Mouth/Throat:     Pharynx: Oropharynx is clear.  Eyes:     Extraocular Movements: Extraocular movements intact.  Cardiovascular:     Rate and Rhythm: Normal rate and regular rhythm.  Pulmonary:     Comments: Mild increased work of breathing.  Lung sounds distant.  No gross crackle. Abdominal:     Comments: Morbid obesity of the abdomen.  Pitting  and diffuse erythema of the lower abdominal wall.  Genitourinary:    Comments: Moderate general edema of the genitals. Musculoskeletal:     Comments: Severe bilateral edema 3+.  This is including the thighs and lower abdomen.  Skin:    General: Skin is warm and dry.  Neurological:     General: No focal deficit present.      Mental Status: He is oriented to person, place, and time.     Coordination: Coordination normal.  Psychiatric:        Mood and Affect: Mood normal.      ED Treatments / Results  Labs (all labs ordered are listed, but only abnormal results are displayed) Labs Reviewed - No data to display  EKG EKG Interpretation  Date/Time:  Sunday February 13 2019 14:50:02 EDT Ventricular Rate:  60 PR Interval:    QRS Duration: 149 QT Interval:  441 QTC Calculation: 441 R Axis:   -43 Text Interpretation:  Atrial fibrillation Left bundle branch block no change from old Confirmed by Charlesetta Shanks [phone]) on 02/13/2019 3:33:38 PM   Radiology No results found.  Procedures Procedures (including critical care time)  Medications Ordered in ED Medications - No data to display   Initial Impression / Assessment and Plan / ED Course  I have reviewed the triage vital signs and the nursing notes.  Pertinent labs & imaging results that were available during my care of the patient were reviewed by me and considered in my medical decision making (see chart for details).  Clinical Course as of Feb 19 732  Sun Feb 13, 2019  1736 Consult: Reviewed with hospitalist will admit for volume overload.   [MP]    Clinical Course User Index [MP] Charlesetta Shanks, MD       Patient presents with chronic but worsening edema and some shortness of breath.  He has severe edema of the lower extremities including genitals and abdomen.  He has been taking maximal doses of outpatient diuretics.  Plan for admission for inpatient diuresis.  Final Clinical Impressions(s) / ED Diagnoses   Final diagnoses:  Anasarca  Acute on chronic congestive heart failure, unspecified heart failure type [facility])    ED Discharge Orders    None       Charlesetta Shanks, MD 02/19/19 [phone]

## 2019-02-13 NOTE — ED Notes (Signed)
Report attempted, RN to call back. 

## 2019-02-13 NOTE — Plan of Care (Signed)
  Problem: Education: Goal: Ability to demonstrate management of disease process will improve Outcome: Progressing Goal: Ability to verbalize understanding of medication therapies will improve Outcome: Progressing Goal: Individualized Educational Video(s) Outcome: Not Met (add Reason)   Problem: Activity: Goal: Capacity to carry out activities will improve Outcome: Not Met (add Reason)   Problem: Cardiac: Goal: Ability to achieve and maintain adequate cardiopulmonary perfusion will improve Outcome: Progressing   Problem: Education: Goal: Knowledge of General Education information will improve Description: Including pain rating scale, medication(s)/side effects and non-pharmacologic comfort measures Outcome: Progressing   Problem: Health Behavior/Discharge Planning: Goal: Ability to manage health-related needs will improve Outcome: Progressing   Problem: Clinical Measurements: Goal: Ability to maintain clinical measurements within normal limits will improve Outcome: Progressing Goal: Will remain free from infection Outcome: Not Met (add Reason) Goal: Diagnostic test results will improve Outcome: Progressing Goal: Respiratory complications will improve Outcome: Not Met (add Reason) Goal: Cardiovascular complication will be avoided Outcome: Progressing   Problem: Activity: Goal: Risk for activity intolerance will decrease Outcome: Not Met (add Reason)   Problem: Nutrition: Goal: Adequate nutrition will be maintained Outcome: Progressing   Problem: Coping: Goal: Level of anxiety will decrease Outcome: Progressing   Problem: Elimination: Goal: Will not experience complications related to bowel motility Outcome: Progressing Goal: Will not experience complications related to urinary retention Outcome: Progressing   Problem: Safety: Goal: Ability to remain free from injury will improve Outcome: Not Met (add Reason)   Problem: Pain Managment: Goal: General  experience of comfort will improve Outcome: Progressing   Problem: Skin Integrity: Goal: Risk for impaired skin integrity will decrease Outcome: Not Met (add Reason)

## 2019-02-13 NOTE — Telephone Encounter (Signed)
This result was scanned in.  He is now in the ED.

## 2019-02-13 NOTE — ED Notes (Signed)
MD aware of 2.6 digonxin level

## 2019-02-13 NOTE — ED Triage Notes (Signed)
To ed via [facility] from home with c/o fluid overload - building for "a couple weeks" -- has been gaining weight also.  Lower legs swollen, edema to groin area also.

## 2019-02-14 DIAGNOSIS — Z923 Personal history of irradiation: Secondary | ICD-10-CM

## 2019-02-14 DIAGNOSIS — Z952 Presence of prosthetic heart valve: Secondary | ICD-10-CM

## 2019-02-14 DIAGNOSIS — Z960 Presence of urogenital implants: Secondary | ICD-10-CM

## 2019-02-14 DIAGNOSIS — D509 Iron deficiency anemia, unspecified: Secondary | ICD-10-CM

## 2019-02-14 DIAGNOSIS — R011 Cardiac murmur, unspecified: Secondary | ICD-10-CM

## 2019-02-14 DIAGNOSIS — E871 Hypo-osmolality and hyponatremia: Secondary | ICD-10-CM

## 2019-02-14 DIAGNOSIS — Z9889 Other specified postprocedural states: Secondary | ICD-10-CM

## 2019-02-14 DIAGNOSIS — I5033 Acute on chronic diastolic (congestive) heart failure: Secondary | ICD-10-CM

## 2019-02-14 DIAGNOSIS — I482 Chronic atrial fibrillation, unspecified: Secondary | ICD-10-CM

## 2019-02-14 DIAGNOSIS — Z95828 Presence of other vascular implants and grafts: Secondary | ICD-10-CM

## 2019-02-14 DIAGNOSIS — G8929 Other chronic pain: Secondary | ICD-10-CM

## 2019-02-14 DIAGNOSIS — Z8547 Personal history of malignant neoplasm of testis: Secondary | ICD-10-CM

## 2019-02-14 DIAGNOSIS — Z7901 Long term (current) use of anticoagulants: Secondary | ICD-10-CM

## 2019-02-14 DIAGNOSIS — Z9989 Dependence on other enabling machines and devices: Secondary | ICD-10-CM

## 2019-02-14 DIAGNOSIS — Z79899 Other long term (current) drug therapy: Secondary | ICD-10-CM

## 2019-02-14 DIAGNOSIS — G4733 Obstructive sleep apnea (adult) (pediatric): Secondary | ICD-10-CM

## 2019-02-14 DIAGNOSIS — L8990 Pressure ulcer of unspecified site, unspecified stage: Secondary | ICD-10-CM | POA: Insufficient documentation

## 2019-02-14 DIAGNOSIS — Z79891 Long term (current) use of opiate analgesic: Secondary | ICD-10-CM

## 2019-02-14 DIAGNOSIS — I110 Hypertensive heart disease with heart failure: Principal | ICD-10-CM

## 2019-02-14 DIAGNOSIS — E1151 Type 2 diabetes mellitus with diabetic peripheral angiopathy without gangrene: Secondary | ICD-10-CM

## 2019-02-14 DIAGNOSIS — R791 Abnormal coagulation profile: Secondary | ICD-10-CM

## 2019-02-14 DIAGNOSIS — I2510 Atherosclerotic heart disease of native coronary artery without angina pectoris: Secondary | ICD-10-CM

## 2019-02-14 LAB — GLUCOSE, CAPILLARY
Glucose-Capillary: 155 mg/dL — ABNORMAL HIGH (ref 70–99)
Glucose-Capillary: 71 mg/dL (ref 70–99)
Glucose-Capillary: 75 mg/dL (ref 70–99)
Glucose-Capillary: 87 mg/dL (ref 70–99)
Glucose-Capillary: 104 mg/dL — ABNORMAL HIGH (ref 70–99)
Glucose-Capillary: 92 mg/dL (ref 70–99)
Glucose-Capillary: 88 mg/dL (ref 70–99)

## 2019-02-14 LAB — PROTIME-INR
INR: 2.2 — ABNORMAL HIGH (ref 0.8–1.2)
Prothrombin Time: 24.4 seconds — ABNORMAL HIGH (ref 11.4–15.2)

## 2019-02-14 LAB — BASIC METABOLIC PANEL
Anion gap: 8 (ref 5–15)
BUN: 37 mg/dL — ABNORMAL HIGH (ref 6–20)
CO2: 34 mmol/L — ABNORMAL HIGH (ref 22–32)
Calcium: 7.9 mg/dL — ABNORMAL LOW (ref 8.9–10.3)
Chloride: 88 mmol/L — ABNORMAL LOW (ref 98–111)
Creatinine, Ser: 1.04 mg/dL (ref 0.61–1.24)
GFR calc Af Amer: >60 mL/min (ref >60)
GFR calc non Af Amer: >60 mL/min (ref >60)
Glucose, Bld: 80 mg/dL (ref 70–99)
Potassium: 3.7 mmol/L (ref 3.5–5.1)
Sodium: 130 mmol/L — ABNORMAL LOW (ref 135–145)

## 2019-02-14 LAB — HEPARIN LEVEL (UNFRACTIONATED): Heparin Unfractionated: <0.1 IU/mL — ABNORMAL LOW (ref 0.30–0.70)

## 2019-02-14 LAB — CBC
HCT: 33.3 % — ABNORMAL LOW (ref 39.0–52.0)
Hemoglobin: 9.6 g/dL — ABNORMAL LOW (ref 13.0–17.0)
MCH: 20 pg — ABNORMAL LOW (ref 26.0–34.0)
MCHC: 28.8 g/dL — ABNORMAL LOW (ref 30.0–36.0)
MCV: 69.2 fL — ABNORMAL LOW (ref 80.0–100.0)
Platelets: 206 10*3/uL (ref 150–400)
RBC: 4.81 MIL/uL (ref 4.22–5.81)
RDW: 19.8 % — ABNORMAL HIGH (ref 11.5–15.5)
WBC: 11.3 10*3/uL — ABNORMAL HIGH (ref 4.0–10.5)
nRBC: 0 % (ref 0.0–0.2)

## 2019-02-14 LAB — SODIUM, URINE, RANDOM: Sodium, Ur: 50 mmol/L

## 2019-02-14 LAB — OSMOLALITY, URINE: Osmolality, Ur: 271 mOsm/kg — ABNORMAL LOW (ref 300–900)

## 2019-02-14 LAB — HEMOGLOBIN A1C
Hgb A1c MFr Bld: 5.2 % (ref 4.8–5.6)
Mean Plasma Glucose: 102.54 mg/dL

## 2019-02-14 LAB — HIV ANTIBODY (ROUTINE TESTING W REFLEX): HIV Screen 4th Generation wRfx: NONREACTIVE

## 2019-02-14 LAB — MAGNESIUM
Magnesium: 1.9 mg/dL (ref 1.7–2.4)
Magnesium: 2 mg/dL (ref 1.7–2.4)

## 2019-02-14 MED ORDER — POTASSIUM CHLORIDE CRYS ER 20 MEQ PO TBCR
30.0000 meq | EXTENDED_RELEASE_TABLET | Freq: Once | ORAL | Status: AC
  Administered 2019-02-14: 30 meq via ORAL
  Filled 2019-02-14: qty 1

## 2019-02-14 MED ORDER — GERHARDT'S BUTT CREAM
TOPICAL_CREAM | Freq: Two times a day (BID) | CUTANEOUS | Status: DC
  Administered 2019-02-14 – 2019-02-20 (×12): via TOPICAL
  Administered 2019-02-20: 1 via TOPICAL
  Administered 2019-02-21 – 2019-02-27 (×9): via TOPICAL
  Filled 2019-02-14 (×2): qty 1

## 2019-02-14 MED ORDER — OXYCODONE HCL 5 MG PO TABS
5.0000 mg | ORAL_TABLET | Freq: Three times a day (TID) | ORAL | Status: DC
  Administered 2019-02-14 – 2019-02-23 (×28): 5 mg via ORAL
  Filled 2019-02-14 (×28): qty 1

## 2019-02-14 MED ORDER — SODIUM CHLORIDE 0.9% FLUSH
10.0000 mL | Freq: Two times a day (BID) | INTRAVENOUS | Status: DC
  Administered 2019-02-15 – 2019-02-26 (×14): 10 mL

## 2019-02-14 MED ORDER — WARFARIN SODIUM 7.5 MG PO TABS
7.5000 mg | ORAL_TABLET | Freq: Once | ORAL | Status: AC
  Administered 2019-02-14: 7.5 mg via ORAL
  Filled 2019-02-14: qty 1

## 2019-02-14 MED ORDER — FUROSEMIDE 10 MG/ML IJ SOLN
160.0000 mg | Freq: Once | INTRAVENOUS | Status: AC
  Administered 2019-02-14: 160 mg via INTRAVENOUS
  Filled 2019-02-14: qty 16

## 2019-02-14 MED ORDER — METHOCARBAMOL 500 MG PO TABS
500.0000 mg | ORAL_TABLET | Freq: Three times a day (TID) | ORAL | Status: DC
  Administered 2019-02-14 – 2019-02-18 (×14): 500 mg via ORAL
  Filled 2019-02-14 (×14): qty 1

## 2019-02-14 MED ORDER — HEPARIN (PORCINE) 25000 UT/250ML-% IV SOLN
1500.0000 [IU]/h | INTRAVENOUS | Status: DC
  Administered 2019-02-14: 1050 [IU]/h via INTRAVENOUS
  Filled 2019-02-14: qty 250

## 2019-02-14 MED ORDER — MUPIROCIN 2 % EX OINT
1.0000 "application " | TOPICAL_OINTMENT | Freq: Two times a day (BID) | CUTANEOUS | Status: DC | PRN
  Administered 2019-02-14 – 2019-02-15 (×2): 1 via TOPICAL
  Filled 2019-02-14: qty 22

## 2019-02-14 MED ORDER — SODIUM CHLORIDE 0.9% FLUSH: 10.0000 mL | INTRAVENOUS | Status: DC | PRN

## 2019-02-14 MED ORDER — TRAZODONE HCL 100 MG PO TABS
100.0000 mg | ORAL_TABLET | Freq: Every day | ORAL | Status: DC
  Administered 2019-02-14 – 2019-02-26 (×13): 100 mg via ORAL
  Filled 2019-02-14 (×13): qty 1

## 2019-02-14 MED ORDER — FUROSEMIDE 10 MG/ML IJ SOLN
120.0000 mg | Freq: Two times a day (BID) | INTRAVENOUS | Status: DC
  Administered 2019-02-15 – 2019-02-20 (×11): 120 mg via INTRAVENOUS
  Filled 2019-02-14 (×3): qty 10
  Filled 2019-02-14: qty 4
  Filled 2019-02-14 (×4): qty 10
  Filled 2019-02-14: qty 12
  Filled 2019-02-14: qty 10
  Filled 2019-02-14 (×2): qty 12
  Filled 2019-02-14: qty 10

## 2019-02-14 MED ORDER — METOLAZONE 5 MG PO TABS
5.0000 mg | ORAL_TABLET | Freq: Every day | ORAL | Status: DC
  Administered 2019-02-15 – 2019-02-23 (×9): 5 mg via ORAL
  Filled 2019-02-14 (×10): qty 1

## 2019-02-14 NOTE — Progress Notes (Signed)
Patient c/o Lance Cook boot is tight pt. Stated he can not tolerate the [employer], [employer] is removed MD paged awaiting MD called bacl

## 2019-02-14 NOTE — Consult Note (Signed)
[place] Nurse wound consult note Reason for Consult:CHornic venous insufficiency with CHF exacerbation and lower leg edema.  HAs removable pneumatic compression at home but states he cannot don and doff this alone.  He has not been wearing compression.  States that leg wraps get too tight.  Instructed him that we will try the Unna boots and can remove if too tight but that removing excess fluid is important at this time.  He agrees to try and will call if needed to remove.  Wound type:venous insufficiency and edema to lower legs.   Moisture associated skin damage (MASD) to perineal and buttocks skin Pressure Injury POA: NA Measurement: n/a Wound [id number] Drainage (amount, consistency, odor) none Periwound:erythema to buttocks Dressing procedure/placement/frequency:Gerhardts butt paste twice daily to perineum and buttocks.   Unna boots to both legs. WEekly.   Will not follow at this time.  Please re-consult if needed.  Domenic Moras MSN, RN, FNP-BC CWON Wound, Ostomy, Continence Nurse Pager [phone]

## 2019-02-14 NOTE — Progress Notes (Signed)
Pt complaining of Left arm muscle pain. Mid line flushes well with good blood return, Site is clean, dry, and intact. Pt refuses to have midline removed because he doesn't want lab to stick him. Discussed findings with patient's RN.  2137- Pt is able to move arm and is no longer complaining of pain.

## 2019-02-14 NOTE — Progress Notes (Signed)
[place] for warfarin + add IV heparin Indication: atrial fibrillation, mech AVR, mech MVR  Allergies  Allergen Reactions  . Penicillins Hives    Has patient had a PCN reaction causing immediate rash, facial/tongue/throat swelling, SOB or lightheadedness with hypotension: Yes Has patient had a PCN reaction causing severe rash involving mucus membranes or skin necrosis: Yes Has patient had a PCN reaction that required hospitalization unsure Has patient had a PCN reaction occurring within the last 10 years: 2010-2012 If all of the above answers are "NO", then may proceed with Cephalosporin use.  . Adhesive [Tape] Other (See Comments)    Skin irritation  . Bactrim [Sulfamethoxazole-Trimethoprim] Other (See Comments)    CDIFF, when taking oral tablets    Patient Measurements: Height: 5\' 1"  (154.9 cm) Weight: 216 lb 7.9 oz (98.2 kg) IBW/kg (Calculated) : 52.3 Heparin Dosing Weight: 75.6 kg  Vital Signs: Temp: 98.7 F (37.1 C) (06/15 1957) Temp Source: Oral (06/15 1957) BP: 114/69 (06/15 1957) Pulse Rate: 53 (06/15 1957)  Labs: Recent Labs    02/13/19 1512 02/14/19 0608 02/14/19 2050  HGB 9.9* 9.6*  --   HCT 35.3* 33.3*  --   PLT 219 206  --   LABPROT 27.1* 24.4*  --   INR 2.6* 2.2*  --   HEPARINUNFRC  --   --  <0.10*  CREATININE 1.00 1.04  --     Estimated Creatinine Clearance: 78.4 mL/min (by C-G formula based on SCr of 1.04 mg/dL).   Medical History: Past Medical History:  Diagnosis Date  . Acute on chronic diastolic CHF (congestive heart failure) ([place]) 08/27/2016  . Anemia   . Anxiety   . ARDS (adult respiratory distress syndrome) ([place]) 01/27/2015  . Arthritis   . Ascending aortic dissection ([place]) 07/14/2008   Localized dissection of ascending aorta noted on CTA in 2009 and stable on CTA in 2011  . Asymptomatic chronic venous hypertension 01/15/2010   Overview:  Overview:  Qualifier: Diagnosis of  By: [name] MD,  [name]   Last Assessment & Plan:  I suggested he go to a vein clinic to see if he could be qualified compression hose of some type, since he is not a surgical candidate according to the vascular surgeons.   . Atrial fibrillation ([place])    chronic persistent  . Bell's palsy   . C. difficile diarrhea   . CAD (coronary artery disease)    Old scar inferior wall myoview, 10/2009 EF 52%.  He did have previous SVG to RCA but no obstructive disease noted on his most recent cath.  SVG occluded.   . Cellulitis 04/02/2014  . Cerebral artery occlusion with cerebral infarction ([place]) 10/08/2011   Overview:  Overview:  And hx of TIA prior to CABG, all thought due to systemic emboli prior to coumadin   . Chronic diastolic heart failure ([place]) 03/01/2015   a. 12/2015: echo showing a preserved EF of 65-70%, moderate AS, and moderate TR.   Marland Kitchen Chronic LBP 10/08/2011  . CVA (cerebral vascular accident) ([place]) 10/08/2011   And hx of TIA prior to CABG, all thought due to systemic emboli prior to coumadin   . DIABETES MELLITUS, TYPE II 11/01/2009   Qualifier: Diagnosis of  By: [name] MD, [name]    . Diverticulosis   . 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 Encephalopathy acute  02/05/2015  . Gastric polyps   . GERD (gastroesophageal reflux disease)    not needing medication at thhis time- 01/22/15  . Gout   . H/O mechanical aortic valve replacement 11/01/2009   Qualifier: Diagnosis of  By: [name], [name]    . Hiatal hernia   . History of colon polyps 12/23/2011   Overview:  Overview:  Colonoscopy January 2010, 6 mm rectal tubulovillous adenoma. No high-grade dysplasia   . HTN (hypertension) 02/23/2014  . Hyperlipidemia   . Hypertension   . Hypogonadism male 04/08/2012  . Impaired glucose tolerance 10/08/2011  . Morbid obesity ([place]) 04/02/2014  . Myocardial infarction [facility]) age 80  . OSA  (obstructive sleep apnea)    CPAP  . Peripheral vascular disease ([place])   . 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 6/50/3546   33 mm St Jude bileaflet mechanical valve placed via right mini thoracotomy approach  . 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    1988 - Dr Blase Mess at [facility] in [place], [place]  . Severe mitral regurgitation 10/11/2014  . Testicular cancer [facility])    He was 58 y/o. He had surgical resection and rad tx's.   Marland Kitchen TIA (transient ischemic attack)    age 9  . Tubulovillous adenoma of colon   . Varicose veins   . 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 Venous (peripheral) insufficiency 02/03/2005    Medications:  Infusions:  . sodium chloride Stopped (02/13/19 2300)  . [START ON 02/15/2019] furosemide    . heparin 1,050 Units/hr (02/14/19 1434)    Assessment:  58 yo male on chronic Coumadin for afib, mechanical MVR and mechanical AVR.  INR goal 2.5-3.5.  Today's INR down to 2.2 and pharmacy asked to initiate IV heparin.  Also continues on Coumadin.  PTA Coumadin dose 5 mg daily except 2.5 mg on Mondays.  Recently held dose 6/9 (PTA) for supratherapeutic INR of 4.7.  No overt bleeding or complications noted, Hgb low but stable, Pltc WNL.  PM heparin level < 0.10  Goal of Therapy:  INR 2-3 Heparin level 0.3-0.7 units/ml Monitor platelets by anticoagulation protocol: Yes   Plan:  Increase heparin to 1250 units / hr 8 hour heparin level  Thank you Anette Guarneri, PharmD  02/14/2019 9:39 PM

## 2019-02-14 NOTE — Consult Note (Signed)
Cardiology Consultation:   Patient ID: Lance Cook MRN: [id number]; DOB: 08/03/1961  Admit date: 02/13/2019 Date of Consult: 02/14/2019  Primary Care Provider: [name], MD Primary Cardiologist: [name], MD  Primary Electrophysiologist:  None    Patient Profile:   Lance Cook is a 58 y.o. male with a hx of valvular heart disease and congestive heart failure who is being seen today for the evaluation of congestive heart failure at the request of Dr Tarri Abernethy.   History of Present Illness:   Lance Cook has a complex cardiac history.  The patient initially underwent mechanical aortic valve replacement and Bentall procedure in 1988.  He developed progressive mitral valve regurgitation and underwent mechanical mitral valve replacement in 2016.  The patient has a history of permanent atrial fibrillation, chronic diastolic heart failure, stroke, chronic venous insufficiency, and obstructive sleep apnea.  He has been followed closely by Dr. [name] over the years.  Of late he has developed worsening problems with anasarca and progressive dyspnea.  He has been treated with chronic anticoagulation in the setting of permanent atrial fibrillation.  The patient also has a chronic pain syndrome on oral narcotic therapy.  Past Medical History:  Diagnosis Date  . Acute on chronic diastolic CHF (congestive heart failure) ([place]) 08/27/2016  . Anemia   . Anxiety   . ARDS (adult respiratory distress syndrome) ([place]) 01/27/2015  . Arthritis   . Ascending aortic dissection ([place]) 07/14/2008   Localized dissection of ascending aorta noted on CTA in 2009 and stable on CTA in 2011  . Asymptomatic chronic venous hypertension 01/15/2010   Overview:  Overview:  Qualifier: Diagnosis of  By: [name] MD, [name]   Last Assessment & Plan:  I suggested he go to a vein clinic to see if he could be qualified compression hose of some type, since he is not a surgical candidate according to the vascular surgeons.    . Atrial fibrillation ([place])    chronic persistent  . Bell's palsy   . C. difficile diarrhea   . CAD (coronary artery disease)    Old scar inferior wall myoview, 10/2009 EF 52%.  He did have previous SVG to RCA but no obstructive disease noted on his most recent cath.  SVG occluded.   . Cellulitis 04/02/2014  . Cerebral artery occlusion with cerebral infarction ([place]) 10/08/2011   Overview:  Overview:  And hx of TIA prior to CABG, all thought due to systemic emboli prior to coumadin   . Chronic diastolic heart failure ([place]) 03/01/2015   a. 12/2015: echo showing a preserved EF of 65-70%, moderate AS, and moderate TR.   Marland Kitchen Chronic LBP 10/08/2011  . CVA (cerebral vascular accident) ([place]) 10/08/2011   And hx of TIA prior to CABG, all thought due to systemic emboli prior to coumadin   . DIABETES MELLITUS, TYPE II 11/01/2009   Qualifier: Diagnosis of  By: [name] MD, [name]    . Diverticulosis   . 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 Encephalopathy acute 02/05/2015  . Gastric polyps   . GERD (gastroesophageal reflux disease)    not needing medication at thhis time- 01/22/15  . Gout   . H/O mechanical aortic valve replacement 11/01/2009   Qualifier: Diagnosis of  By: [name], [name]    . Hiatal hernia   . History of colon polyps 12/23/2011   Overview:  Overview:  Colonoscopy January 2010, 6 mm rectal tubulovillous adenoma. No high-grade dysplasia   . HTN (hypertension) 02/23/2014  . Hyperlipidemia   . Hypertension   . Hypogonadism male 04/08/2012  . Impaired glucose tolerance 10/08/2011  . Morbid obesity ([place]) 04/02/2014  . Myocardial infarction [facility]) age 41  . OSA (obstructive sleep apnea)    CPAP  . Peripheral vascular disease ([place])   . 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 1/61/0960   33 mm St Jude bileaflet mechanical valve placed via  right mini thoracotomy approach  . 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    1988 - Dr Blase Mess at [facility] in [place], [place]  . Severe mitral regurgitation 10/11/2014  . Testicular cancer [facility])    He was 58 y/o. He had surgical resection and rad tx's.   Marland Kitchen TIA (transient ischemic attack)    age 47  . Tubulovillous adenoma of colon   . Varicose veins   . 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 Venous (peripheral) insufficiency 02/03/2005    Past Surgical History:  Procedure Laterality Date  . aortic truck    . BENTALL PROCEDURE  1988   25 mm St Jude mechanical valve conduit - Dr Blase Mess at [facility] in [place], [place]  . BIOPSY  06/01/2018   Procedure: BIOPSY;  Surgeon: Jerene Bears, MD;  Location: [facility] ENDOSCOPY;  Service: Gastroenterology;;  . CARDIAC VALVE REPLACEMENT     [phone]  . CHOLECYSTECTOMY  2011  . COLONOSCOPY WITH PROPOFOL N/A 06/17/2016   Procedure: COLONOSCOPY WITH PROPOFOL;  Surgeon: Jerene Bears, MD;  Location: [facility];  Service: Gastroenterology;  Laterality: N/A;  . COLONOSCOPY WITH PROPOFOL N/A 06/01/2018   Procedure: COLONOSCOPY WITH PROPOFOL;  Surgeon: Jerene Bears, MD;  Location: [facility];  Service: Gastroenterology;  Laterality: N/A;  . CORONARY ARTERY BYPASS GRAFT    . LASER ABLATION Left 03/06/2010   leg  . MITRAL VALVE REPLACEMENT Right 01/24/2015   Procedure: Re-Operation, MINIMALLY INVASIVE MITRAL VALVE (MV) REPLACEMENT.;  Surgeon: Rexene Alberts, MD;  Location: [facility];  Service: Open Heart Surgery;  Laterality: Right;  . ORCHIECTOMY  age 71   testicular cancer  . OTOPLASTY     bilateral, age 48  . POLYPECTOMY  06/01/2018   Procedure: POLYPECTOMY;  Surgeon: Jerene Bears, MD;  Location: [facility];  Service: Gastroenterology;;  . TEE WITHOUT CARDIOVERSION N/A 10/11/2014   Procedure: TRANSESOPHAGEAL ECHOCARDIOGRAM (TEE);  Surgeon: Pixie Casino, MD;   Location: [facility] ENDOSCOPY;  Service: Cardiovascular;  Laterality: N/A;  . TEE WITHOUT CARDIOVERSION N/A 01/24/2015   Procedure: TRANSESOPHAGEAL ECHOCARDIOGRAM (TEE);  Surgeon: Rexene Alberts, MD;  Location: [facility];  Service: Open Heart Surgery;  Laterality: N/A;  . TONSILLECTOMY  1967  . TOOTH EXTRACTION  2016     Home Medications:  Prior to Admission medications   Medication Sig Start Date End Date Taking? Authorizing Provider  allopurinol (ZYLOPRIM) 300 MG tablet Take 300 mg by mouth daily. 11/17/12  Yes Biagio Borg, MD  atenolol (TENORMIN) 100 MG tablet Take 0.25 tablet (25 mg total) by mouth twice daily. Patient taking differently: Take 25 mg by mouth 2 (two) times daily.  11/18/18  Yes [name], MD  digoxin (LANOXIN) 0.25 MG tablet Take 1 tablet (250 mcg total) by mouth daily. 11/18/18  Yes [name], MD  furosemide (LASIX) 80 MG tablet Take 1 tablet (80 mg total) by mouth  2 (two) times daily. 01/12/19  Yes [name], MD  gabapentin (NEURONTIN) 100 MG capsule Take 200-400 mg by mouth 3 (three) times daily. Dose is dependent on level of leg pain 09/07/18  Yes [provider]  magnesium oxide (MAG-OX) 400 (241.3 MG) MG tablet Take 1 tablet (400 mg total) by mouth 2 (two) times daily. 06/04/15  Yes [name], MD  methocarbamol (ROBAXIN) 500 MG tablet Take 1 tablet (500 mg total) by mouth 3 (three) times daily. Patient taking differently: Take 500-1,000 mg by mouth 3 (three) times daily. Dose is dependent on level of back pain 07/30/18 04/01/19 Yes King, Diona Foley, NP  metolazone (ZAROXOLYN) 2.5 MG tablet Take 1 tablet (2.5 mg total) by mouth every other day. 02/04/19  Yes Almyra Deforest, PA  mupirocin ointment (BACTROBAN) 2 % Apply 1 application topically 2 (two) times daily as needed (infection prevention).    Yes [provider]  oxyCODONE (OXY IR/ROXICODONE) 5 MG immediate release tablet Take 1 tablet (5 mg total) by mouth every 8 (eight) hours as needed for severe  pain. Patient taking differently: Take 5 mg by mouth 3 (three) times daily.  09/28/18 03/06/19 Yes King, Diona Foley, NP  potassium chloride (K-DUR) 10 MEQ tablet Take 4 tablets in the morning, 2 tablets at lunch, 4 tablets in the evening. Patient taking differently: Take 20-40 mEq by mouth See admin instructions. Take 4 tablets (40 meq) by mouth every morning, 2 tablets (20 meq) at lunch, 4 tablets (40 meq) every evening 02/08/19  Yes Almyra Deforest, PA  PRESCRIPTION MEDICATION Inhale into the lungs at bedtime. CPAP   Yes [provider]  silver sulfADIAZINE (SILVADENE) 1 % cream Apply 1 application topically daily as needed (wound care).   Yes [provider]  traZODone (DESYREL) 100 MG tablet Take 100 mg by mouth at bedtime.  01/24/19  Yes [provider]  warfarin (COUMADIN) 5 MG tablet TAKE 1 TO 1 AND 1/2 TABLETS DAILY AS DIRECTED BY COUMADIN CLINIC Patient taking differently: Take 5 mg by mouth every evening. Or as directed by coumadin clinic 01/18/19  Yes [name], MD    Inpatient Medications: Scheduled Meds: . gabapentin  200-400 mg Oral TID  . Gerhardt's butt cream   Topical BID  . insulin aspart  0-15 Units Subcutaneous TID WC  . methocarbamol  500 mg Oral TID  . oxyCODONE  5 mg Oral Q8H  . sodium chloride flush  10-40 mL Intracatheter Q12H  . traZODone  100 mg Oral QHS  . warfarin  7.5 mg Oral ONCE-1800  . Warfarin - Pharmacist Dosing Inpatient   Does not apply q1800   Continuous Infusions: . sodium chloride Stopped (02/13/19 2300)  . heparin 1,050 Units/hr (02/14/19 1434)   PRN Meds: sodium chloride, acetaminophen **OR** acetaminophen, promethazine, senna-docusate, sodium chloride flush  Allergies:    Allergies  Allergen Reactions  . Penicillins Hives    Has patient had a PCN reaction causing immediate rash, facial/tongue/throat swelling, SOB or lightheadedness with hypotension: Yes Has patient had a PCN reaction causing severe rash involving mucus  membranes or skin necrosis: Yes Has patient had a PCN reaction that required hospitalization unsure Has patient had a PCN reaction occurring within the last 10 years: 2010-2012 If all of the above answers are "NO", then may proceed with Cephalosporin use.  . Adhesive [Tape] Other (See Comments)    Skin irritation  . Bactrim [Sulfamethoxazole-Trimethoprim] Other (See Comments)    CDIFF, when taking oral tablets  Social History:   Social History   Socioeconomic History  . Marital status: Married    Spouse name: Not on file  . Number of children: 0  . Years of education: Not on file  . Highest education level: Not on file  Occupational History  . Occupation: Disabled Environmental education officer: UNEMPLOYED  Social Needs  . Financial resource strain: Not on file  . Food insecurity    Worry: Not on file    Inability: Not on file  . Transportation needs    Medical: Not on file    Non-medical: Not on file  Tobacco Use  . Smoking status: Former Smoker    Types: Cigars  . Smokeless tobacco: Never Used  . Tobacco comment: about 3 yearly- cigar  Substance and Sexual Activity  . Alcohol use: Yes    Alcohol/week: 0.0 standard drinks    Comment: social  . Drug use: No  . Sexual activity: Not on file  Lifestyle  . Physical activity    Days per week: Not on file    Minutes per session: Not on file  . Stress: Not on file  Relationships  . Social Herbalist on phone: Not on file    Gets together: Not on file    Attends religious service: Not on file    Active member of club or organization: Not on file    Attends meetings of clubs or organizations: Not on file    Relationship status: Not on file  . Intimate partner violence    Fear of current or ex partner: Not on file    Emotionally abused: Not on file    Physically abused: Not on file    Forced sexual activity: Not on file  Other Topics Concern  . Not on file  Social History Narrative  . Not on file    Family  History:   Family History  Problem Relation Age of Onset  . Heart disease Mother   . Throat cancer Mother   . Other Mother        bowel obstruction  . Diabetes Father   . Stroke Other   . Hypertension Other   . Cancer Maternal Aunt        lung, brain  . Cancer Maternal Uncle        brain aneurysm  . Colon cancer Neg Hx      ROS:  Please see the history of present illness.  All other ROS reviewed and negative.     Physical Exam/Data:   Vitals:   02/13/19 2351 02/14/19 0355 02/14/19 1109 02/14/19 1707  BP: 103/61 (!) 114/52 119/61 (!) 109/57  Pulse: 68 (!) 54 (!) 54 (!) 56  Resp: 16 18  18   Temp: 98.9 F (37.2 C) 98.8 F (37.1 C)  98.2 F (36.8 C)  TempSrc: Oral Oral  Oral  SpO2: 100% 100%  100%  Weight:  98.2 kg    Height:        Intake/Output Summary (Last 24 hours) at 02/14/2019 1716 Last data filed at 02/14/2019 1556 Gross per 24 hour  Intake 1360 ml  Output 3600 ml  Net -2240 ml   Last 3 Weights 02/14/2019 02/13/2019 02/13/2019  Weight (lbs) 216 lb 7.9 oz 219 lb 218 lb  Weight (kg) 98.2 kg 99.338 kg 98.884 kg     Body mass index is 40.91 kg/m.  General: Ill-appearing, obese male in no distress HEENT: normal Lymph: no adenopathy Neck: Marked  JVD Endocrine:  No thryomegaly Vascular: No carotid bruits;  Cardiac: Irregularly irregular with crisp mechanical closure sounds, 3/6 systolic murmur heard best at the left lower sternal border, no diastolic murmur appreciated Lungs:  clear to auscultation bilaterally, no wheezing, rhonchi or rales  Abd: soft, nontender, the liver is pulsatile and enlarged, easily palpable below the costal margin  Ext: Diffuse edema into the thighs bilaterally, the lower legs are wrapped in Ace bandages Musculoskeletal:  No deformities, BUE and BLE strength normal and equal Skin: warm and dry  Neuro:  CNs 2-12 intact, no focal abnormalities noted Psych:  Normal affect   EKG:  The EKG was personally reviewed and demonstrates: Atrial  fibrillation 60 bpm, left bundle branch block  Telemetry:  Telemetry was personally reviewed and demonstrates: Atrial fibrillation  Relevant CV Studies: Echocardiogram 01/31/2019: IMPRESSIONS    1. The left ventricle has normal systolic function, with an ejection fraction of 55-60%. The cavity size was normal. Left ventricular diastolic function could not be evaluated due to mitral valve replacement/repair. There is abnormal septal motion  consistent with left bundle branch block.  2. Mild hypokinesis of the left ventricular, entire septal wall.  3. The right ventricle has normal systolic function. The cavity was normal. There is no increase in right ventricular wall thickness. Right ventricular systolic pressure is severely elevated with an estimated pressure of 62.4 mmHg.  4. Left atrial size was severely dilated.  5. Right atrial size was severely dilated.  6. A 33 St. Jude mechanical valve is present in the mitral position. Procedure Date: 01/24/2015 There is no Echo evidence of rocking of the mitral prosthesis. Echo findings are consistent with perivalvular leak the mitral prosthesis.  7. Mitral valve regurgitation mild perivalvular. Mitral valve gradient appropriate for prosthesis mitral valve stenosis.  8. The tricuspid valve is grossly normal. Tricuspid valve regurgitation is moderate.  9. A 25mm St. Jude mechanical prosthesis valve is present in the aortic position. Normal aortic valve prosthesis. Echo findings are consistent with Mean gradient ~30 mmHg of the aortic prosthesis. 10. The aortic arch is normal in size and structure. 11. There is dilatation of the ascending aorta measuring 38 mm. 12. Post Bentall 1988. 13. The inferior vena cava was dilated in size with <50% respiratory variability.  FINDINGS  Left Ventricle: The left ventricle has normal systolic function, with an ejection fraction of 55-60%. The cavity size was normal. There is no increase in left ventricular wall  thickness. Left ventricular diastolic function could not be evaluated due to  mitral valve replacement/repair. There is abnormal (paradoxical) septal motion, consistent with left bundle branch block. Mild hypokinesis of the left ventricular, entire septal wall.  Right Ventricle: The right ventricle has normal systolic function. The cavity was normal. There is no increase in right ventricular wall thickness. Right ventricular systolic pressure is severely elevated with an estimated pressure of 62.4 mmHg.  Left Atrium: Left atrial size was severely dilated.  Right Atrium: Right atrial size was severely dilated. Right atrial pressure is estimated at 10 mmHg.  Interatrial Septum: No atrial level shunt detected by color flow Doppler.  Pericardium: There is no evidence of pericardial effusion.  Mitral Valve: The mitral valve has been repaired/replaced. Mitral valve regurgitation mild perivalvular. Mitral valve gradient appropriate for prosthesis mitral valve stenosis. A 33 St. Jude mechanical valve is present in the mitral position. Procedure  Date: 01/24/2015 There is no Echo evidence of abnormal rocking of of the mitral prosthesis. Echo findings are consistent with perivalvular  leak the mitral prosthesis.  Tricuspid Valve: The tricuspid valve is grossly normal. Tricuspid valve regurgitation is moderate by color flow Doppler.  Aortic Valve: The aortic valve has been repaired/replaced Aortic valve regurgitation was not visualized by color flow Doppler. A 38mm St. Jude mechanical aortic valve prosthesis valve is present in the aortic position. Normal aortic valve prosthesis.  Echo findings are consistent with Mean gradient ~30 mmHg of the aortic prosthesis.  Pulmonic Valve: The pulmonic valve was grossly normal. Pulmonic valve regurgitation is not visualized by color flow Doppler. No evidence of pulmonic stenosis.  Aorta: The aortic arch is normal in size and structure. There is dilatation  of the ascending aorta measuring 38 mm. Post Bentall 1988.  Pulmonary Artery: The pulmonary artery is not well seen.  Venous: The inferior vena cava is dilated in size with less than 50% respiratory variability.    +--------------+--------++ LEFT VENTRICLE         +----------------+---------++ +--------------+--------++ Diastology                PLAX 2D                +----------------+---------++ +--------------+--------++ LV e' lateral:  6.20 cm/s LVOT diam:    2.10 cm  +----------------+---------++ +--------------+--------++ LV E/e' lateral:32.2      LVOT Area:    3.46 cm +----------------+---------++ +--------------+--------++ LV e' medial:   5.77 cm/s                        +----------------+---------++ +--------------+--------++ LV E/e' medial: 34.6                                 +----------------+---------++    +---------------+---------++ RIGHT VENTRICLE          +---------------+---------++ RV S prime:    7.72 cm/s +---------------+---------++ TAPSE (M-mode):2.2 cm    +---------------+---------++ RVSP:          67.4 mmHg +---------------+---------++  +---------------+--------++------------++ LEFT ATRIUM            Index        +---------------+--------++------------++ LA Vol (A2C):  256.0 ml135.19 ml/m +---------------+--------++------------++ LA Vol (A4C):  272.0 ml143.64 ml/m +---------------+--------++------------++ LA Biplane Vol:266.0 ml140.48 ml/m +---------------+--------++------------++ +------------+----------++-----------++ RIGHT ATRIUM          Index       +------------+----------++-----------++ RA Pressure:15.00 mmHg            +------------+----------++-----------++ RA Area:    26.90 cm             +------------+----------++-----------++ RA Volume:  84.60 ml  44.68 ml/m +------------+----------++-----------++   +------------------+------------++ AORTIC VALVE                   +------------------+------------++ AV Area (Vmax):   0.40 cm     +------------------+------------++ AV Area (Vmean):  0.42 cm     +------------------+------------++ AV Area (VTI):    0.41 cm     +------------------+------------++ AV Vmax:          384.00 cm/s  +------------------+------------++ AV Vmean:         261.000 cm/s +------------------+------------++ AV VTI:           0.851 m      +------------------+------------++ AV Peak Grad:     59.0 mmHg    +------------------+------------++ AV Mean Grad:     31.6 mmHg    +------------------+------------++ LVOT Vmax:  44.20 cm/s   +------------------+------------++ LVOT Vmean:       31.760 cm/s  +------------------+------------++ LVOT VTI:         0.100 m      +------------------+------------++ LVOT/AV VTI ratio:0.12         +------------------+------------++   +------------+-------++ AORTA               +------------+-------++ Ao Asc diam:3.80 cm +------------+-------++  +--------------+----------++  +---------------+-----------++ MITRAL VALVE              TRICUSPID VALVE            +--------------+----------++  +---------------+-----------++ MV Area (PHT):4.09 cm    TR Peak grad:  52.4 mmHg   +--------------+----------++  +---------------+-----------++ MV Peak grad: 23.4 mmHg   TR Vmax:       362.00 cm/s +--------------+----------++  +---------------+-----------++ MV Mean grad: 3.7 mmHg    Estimated RAP: 15.00 mmHg  +--------------+----------++  +---------------+-----------++ MV Vmax:      2.42 m/s    RVSP:          67.4 mmHg   +--------------+----------++  +---------------+-----------++ MV Vmean:     71.4 cm/s  +--------------+----------++  +--------------+-------+ MV VTI:       0.46 m      SHUNTS                 +--------------+----------++  +--------------+-------+ MV PHT:       53.77 msec  Systemic VTI: 0.10 m  +--------------+----------++  +--------------+-------+ MV Decel Time:185 msec    Systemic Diam:2.10 cm +--------------+----------++  +--------------+-------+ +--------------+-----------++ MV E velocity:199.60 cm/s +--------------+-----------++   Laboratory Data:  Chemistry Recent Labs  Lab 02/13/19 1512 02/14/19 0608  NA 126* 130*  K 5.3* 3.7  CL 89* 88*  CO2 30 34*  GLUCOSE 97 80  BUN 41* 37*  CREATININE 1.00 1.04  CALCIUM 7.6* 7.9*  GFRNONAA >60 >60  GFRAA >60 >60  ANIONGAP 7 8    No results for input(s): PROT, ALBUMIN, AST, ALT, ALKPHOS, BILITOT in the last 168 hours. Hematology Recent Labs  Lab 02/13/19 1512 02/14/19 0608  WBC 13.8* 11.3*  RBC 5.04 4.81  HGB 9.9* 9.6*  HCT 35.3* 33.3*  MCV 70.0* 69.2*  MCH 19.6* 20.0*  MCHC 28.0* 28.8*  RDW 19.9* 19.8*  PLT 219 206   Cardiac EnzymesNo results for input(s): TROPONINI in the last 168 hours. No results for input(s): TROPIPOC in the last 168 hours.  BNP Recent Labs  Lab 02/13/19 1504  BNP 223.8*    DDimer No results for input(s): DDIMER in the last 168 hours.  Radiology/Studies:  Dg Chest Portable 1 View  Result Date: 02/13/2019 CLINICAL DATA:  To ed via [facility] from home with c/o fluid overload - building for "a couple weeks" -- has been gaining weight also. Lower legs swollen, edema to groin area also. EXAM: PORTABLE CHEST 1 VIEW COMPARISON:  01/14/2018 FINDINGS: Cardiac silhouette is mildly enlarged. There changes from cardiac surgery and valve replacement, stable. No mediastinal or hilar masses. No convincing adenopathy. Bilateral prominent bronchovascular markings similar to the prior study. Suspect a small right pleural effusion. No lung consolidation. No pneumothorax. Skeletal structures are grossly intact. IMPRESSION: 1. No acute cardiopulmonary disease. No evidence pulmonary edema.  Chronically prominent bronchovascular markings stable. 2. Stable mild cardiomegaly and changes from prior cardiac surgery. Electronically Signed   By: Lajean Manes M.D.   On: 02/13/2019 16:25    Assessment and Plan:   1. Acute on chronic diastolic heart failure 2.  Valvular heart disease with mechanical aortic and mitral valve bioprostheses, elevated transaortic gradients noted 3. Permanent atrial fibrillation on oral anticoagulation with warfarin  I have personally reviewed the patient's echo images which demonstrate low normal LV systolic function.  The patient's transaortic gradients are elevated with a mean gradient of approximately 33 mmHg.  The patient's dimensionless index is only 0.12 which raises the suspicion for severe prosthetic aortic valve stenosis.  However, the pulse Doppler may not accurately reflect the true LV outflow tract velocity based on personal review of this echo.  The patient's transaortic gradients have been elevated chronically over the past 5 years. The patient's mechanical mitral valve appears to be functioning normally with only trivial paravalvular regurgitation.  It would be reasonable to evaluate his mechanical valvular function with a gated CT angiogram of the heart to see if there is pannus or reduced mechanical prosthetic leaflet mobility present.  I would do this only after he has been adequately diuresed and demonstrates stable renal function.  I would continue the patient on furosemide 120 mg daily with metolazone 5 mg given before the morning furosemide dose. The patient has had a reasonable response to high dose bolus lasix. Hopefully the addition of metolazone will help stimulate further diuresis.  Depending on this patient's clinical course, would have a relatively low threshold to consult the advanced heart failure team.  We will continue to follow with you.  For questions or updates, please contact [place] Please consult [email or web address] for contact info  under   Signed, Sherren Mocha, MD  02/14/2019 5:16 PM

## 2019-02-14 NOTE — Evaluation (Signed)
Occupational Therapy Evaluation and Discharge Patient Details Name: Lance Cook MRN: [id number] DOB: 12-22-1960 Today's Date: 02/14/2019    History of Present Illness 58 yo admitted with HF exacerbation. PMHx: CHF, AVR, MVR, CAD, AFib, AA dissection, DM, HTN, PVD, testicular CA, CVA at 58yo   Clinical Impression   Pt is functioning near his baseline. He is not interested in further ADL education including use of AE for LB bathing and dressing. Pt is content to rely on his wife's assistance. Signing off.     Follow Up Recommendations  No OT follow up    Equipment Recommendations  None recommended by OT    Recommendations for Other Services       Precautions / Restrictions Precautions Precautions: Fall Precaution Comments: denies any falls Restrictions Weight Bearing Restrictions: No      Mobility Bed Mobility Overal bed mobility: Needs Assistance Bed Mobility: Supine to Sit;Sit to Supine     Supine to sit: Min guard;HOB elevated Sit to supine: Min assist   General bed mobility comments: assist for LEs back into bed  Transfers Overall transfer level: Modified independent Equipment used: None             General transfer comment: pt wanting a medical step stool for his bed at home, made aware this is not covered by insurance    Balance Overall balance assessment: Needs assistance   Sitting balance-Leahy Scale: Good       Standing balance-Leahy Scale: Fair                             ADL either performed or assessed with clinical judgement   ADL Overall ADL's : At baseline                                       General ADL Comments: pt will baseline dependence, educated in energy conservation and availability of AE for LB ADL, pt not interested     Vision Baseline Vision/History: Wears glasses Wears Glasses: At all times Patient Visual Report: No change from baseline       Perception     Praxis      Pertinent  Vitals/Pain Pain Assessment: Faces Pain Score: 6  Faces Pain Scale: Hurts even more Pain Location: LEs Pain Descriptors / Indicators: Tightness Pain Intervention(s): Monitored during session;Repositioned     Hand Dominance Right   Extremity/Trunk Assessment Upper Extremity Assessment Upper Extremity Assessment: Overall WFL for tasks assessed   Lower Extremity Assessment Lower Extremity Assessment: Defer to PT evaluation   Cervical / Trunk Assessment Cervical / Trunk Assessment: Kyphotic   Communication Communication Communication: No difficulties   Cognition Arousal/Alertness: Awake/alert Behavior During Therapy: WFL for tasks assessed/performed Overall Cognitive Status: Within Functional Limits for tasks assessed                                     General Comments       Exercises     Shoulder Instructions      Home Living Family/patient expects to be discharged to:: Private residence Living Arrangements: Spouse/significant other Available Help at Discharge: Family;Available 24 hours/day Type of Home: House Home Access: Stairs to enter [employer] of Steps: 5 Entrance Stairs-Rails: Right Home Layout: One level     Bathroom Shower/Tub:  Walk-in shower   Bathroom Toilet: Standard     Home Equipment: Environmental consultant - 2 wheels;Shower seat;Wheelchair - manual;Hand held shower head   Additional Comments: wife is visually impaired      Prior Functioning/Environment Level of Independence: Needs assistance  Gait / Transfers Assistance Needed: pt normally furniture walks, doesn't walk far other than to get to his car. Chronic bil LE edema and back pain. wife assists to pull him into sitting from bed and standing ADL's / Homemaking Assistance Needed: wife assists with LB bathing and dressing, dries pt after showering and does much of the IADL            OT Problem List: Decreased activity tolerance;Impaired balance (sitting and/or  standing);Decreased knowledge of use of DME or AE;Obesity;Pain      OT Treatment/Interventions:      OT Goals(Current goals can be found in the care plan section) Acute Rehab OT Goals Patient Stated Goal: return home  OT Frequency:     Barriers to D/C:            Co-evaluation              AM-PAC OT "6 Clicks" Daily Activity     Outcome Measure Help from another person eating meals?: None Help from another person taking care of personal grooming?: A Little Help from another person toileting, which includes using toliet, bedpan, or urinal?: A Little Help from another person bathing (including washing, rinsing, drying)?: A Little Help from another person to put on and taking off regular upper body clothing?: None Help from another person to put on and taking off regular lower body clothing?: A Lot 6 Click Score: 19   End of Session Equipment Utilized During Treatment: Oxygen  Activity Tolerance: Patient tolerated treatment well Patient left: in bed;with call bell/phone within reach;with nursing/sitter in room  OT Visit Diagnosis: Other abnormalities of gait and mobility (R26.89);Pain                Time: 1432-1455 OT Time Calculation (min): 23 min Charges:  OT General Charges $OT Visit: 1 Visit OT Evaluation $OT Eval Moderate Complexity: 1 Mod OT Treatments $Self Care/Home Management : 8-22 mins  Nestor Lewandowsky, OTR/L Acute Rehabilitation Services Pager: [phone] Office: [phone]  Malka So 02/14/2019, 3:08 PM

## 2019-02-14 NOTE — Progress Notes (Addendum)
Subjective: He does feel like his breathing has improved this morning. He does continue to have a significant LE edema bilaterally. He reports back pain which he states is chronic, has not been getting his flexeril and pain medications. He reported that he was wanting to get up to urinate, currently has a condom catheter in place.   Objective:  Vital signs in last 24 hours: Vitals:   02/13/19 2031 02/13/19 2216 02/13/19 2351 02/14/19 0355  BP:   103/61 (!) 114/52  Pulse:   68 (!) 54  Resp:   16 18  Temp:   98.9 F (37.2 C) 98.8 F (37.1 C)  TempSrc:   Oral Oral  SpO2:  (!) 85% 100% 100%  Weight: 99.3 kg     Height: 5\' 1"  (1.549 m)       General: Middle aged male, NAD, sitting up in bed Cardiac: RRR, systolic murmur 3/6, loudest in R 2nd intercostal space Pulmonary: Normal work of breathing, bibasilar crackles, no wheezing Abdomen: Soft, non-tender, non-distended, normoactive bowel sounds Extremity:2+ BL LE edema, with erythematous rash up to mid shin    Assessment/Plan:  Active Problems:   Acute on chronic heart failure [facility])  This is a 58 year old male with a history of half-pack, mechanical AVR status post Bentall procedure 1988, severe mitral regurgitation status post mechanical valve in May 2016, CAD status post SVG-RCA, chronic A. fib, ascending aortic dissection, diabetes mellitus, hypertension, OSA on CPAP, PVD, testicular cancer status post medication treatment and surgical resection who presented with worsening lower extremity edema.  Acute on chronic HFpEF: Possibly due to dietary indiscretion. BNP elevated to 223. CXR showed no acute findings. Echo on 01/31/19 showed EF 55-60%, severely elevated right ventricular pressure, severely dilated right and left atrium, perivalvular leak of the the mitral prosthesis and dilated IVC with <50% expiratory variation. Dry weight of 189lbs per cardiology note. He is on lasix 120 mg BID and metolazone 2.5 mg every other day at home.  Treated with IV lasix 120 mg BID here. Net output of 1L however patient reports that he has been urinating a lot, may not have been recorded. Weight is 216 lbs (yesterday it was 218-219lbs). Slow response to lasix however has symptomatic improvement. Digoxin level elevated, no evidence of toxicity. He still has some evidence of fluid overload on exam today. Will need further diuresis.   -Consult cardiology, appreciate recommendations -Continue IV lasix 120 mg BID -Consider adding metolazone 2.5 mg tomorrow if not having good response -Continue holding digoxin -PT/OT eval -Fluid restriction to <1200 daily -Strict I+Os -Daily weights  History of mechanical AVR and mechanical MVR: -Goal INR of 3, INR on admission 2.6. Today it's 2.2. No evidence of bleeding on exam. -Add heparin per pharmacy due to subtherapeutic INR.   -Continue warfarin per pharm  Hypervolemic hyponatremia: -Na down to 126 on admission, currently fluid overloaded on exam requiring diuresis. Na improved to 130 this morning.  -Continue diuresis -Daily BMP  Chronic atrial fibrillation: -Currently rate controlled. Continue atenolol 25 mg BID.  -Continue warfarin  Microcytic anemia: -Hgb on admission 9.9, baseline around 9-10. Elevated RDW, iron saturation 5%, ferritin normal. Will continue to monitor.   OSA: -CPAP at night  DM:  -A1c 5.2.  -Continue SSI -Frequent glucose checks  Chronic pain: -Continue oxycodone and gabapentin. Place as a scheduled oxycodone.  -Restart robaxin TID  FEN: No fluids, replete lytes prn, HH diet with fluid restriction VTE ppx: Warfarin Code Status: FULL    Dispo: Anticipated  discharge is pending clinical improvement.   [name], MD 02/14/2019, 6:42 AM Pager: [phone]

## 2019-02-14 NOTE — Progress Notes (Signed)
At shift change patient complaining of pain from unna boots.  Purpose of unna boots explained and patient still states that he "cannot stand it".  RN explained that I am not allowed to take them off.   RN paged imts to notify.

## 2019-02-14 NOTE — Progress Notes (Signed)
[place] for warfarin + add IV heparin Indication: atrial fibrillation, mech AVR, mech MVR  Allergies  Allergen Reactions  . Penicillins Hives    Has patient had a PCN reaction causing immediate rash, facial/tongue/throat swelling, SOB or lightheadedness with hypotension: Yes Has patient had a PCN reaction causing severe rash involving mucus membranes or skin necrosis: Yes Has patient had a PCN reaction that required hospitalization unsure Has patient had a PCN reaction occurring within the last 10 years: 2010-2012 If all of the above answers are "NO", then may proceed with Cephalosporin use.  . Adhesive [Tape] Other (See Comments)    Skin irritation  . Bactrim [Sulfamethoxazole-Trimethoprim] Other (See Comments)    CDIFF, when taking oral tablets    Patient Measurements: Height: 5\' 1"  (154.9 cm) Weight: 216 lb 7.9 oz (98.2 kg) IBW/kg (Calculated) : 52.3 Heparin Dosing Weight: 75.6 kg  Vital Signs: Temp: 98.8 F (37.1 C) (06/15 0355) Temp Source: Oral (06/15 0355) BP: 114/52 (06/15 0355) Pulse Rate: 54 (06/15 0355)  Labs: Recent Labs    02/13/19 1512 02/14/19 0608  HGB 9.9* 9.6*  HCT 35.3* 33.3*  PLT 219 206  LABPROT 27.1* 24.4*  INR 2.6* 2.2*  CREATININE 1.00 1.04    Estimated Creatinine Clearance: 78.4 mL/min (by C-G formula based on SCr of 1.04 mg/dL).   Medical History: Past Medical History:  Diagnosis Date  . Acute on chronic diastolic CHF (congestive heart failure) ([place]) 08/27/2016  . Anemia   . Anxiety   . ARDS (adult respiratory distress syndrome) ([place]) 01/27/2015  . Arthritis   . Ascending aortic dissection ([place]) 07/14/2008   Localized dissection of ascending aorta noted on CTA in 2009 and stable on CTA in 2011  . Asymptomatic chronic venous hypertension 01/15/2010   Overview:  Overview:  Qualifier: Diagnosis of  By: [name] MD, [name]   Last Assessment & Plan:  I suggested he go to a vein clinic to see if he  could be qualified compression hose of some type, since he is not a surgical candidate according to the vascular surgeons.   . Atrial fibrillation ([place])    chronic persistent  . Bell's palsy   . C. difficile diarrhea   . CAD (coronary artery disease)    Old scar inferior wall myoview, 10/2009 EF 52%.  He did have previous SVG to RCA but no obstructive disease noted on his most recent cath.  SVG occluded.   . Cellulitis 04/02/2014  . Cerebral artery occlusion with cerebral infarction ([place]) 10/08/2011   Overview:  Overview:  And hx of TIA prior to CABG, all thought due to systemic emboli prior to coumadin   . Chronic diastolic heart failure ([place]) 03/01/2015   a. 12/2015: echo showing a preserved EF of 65-70%, moderate AS, and moderate TR.   Marland Kitchen Chronic LBP 10/08/2011  . CVA (cerebral vascular accident) ([place]) 10/08/2011   And hx of TIA prior to CABG, all thought due to systemic emboli prior to coumadin   . DIABETES MELLITUS, TYPE II 11/01/2009   Qualifier: Diagnosis of  By: [name] MD, [name]    . Diverticulosis   . 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 Encephalopathy acute 02/05/2015  . Gastric polyps   . GERD (gastroesophageal reflux disease)    not needing medication at thhis time- 01/22/15  . Gout   . H/O  mechanical aortic valve replacement 11/01/2009   Qualifier: Diagnosis of  By: [name], [name]    . Hiatal hernia   . History of colon polyps 12/23/2011   Overview:  Overview:  Colonoscopy January 2010, 6 mm rectal tubulovillous adenoma. No high-grade dysplasia   . HTN (hypertension) 02/23/2014  . Hyperlipidemia   . Hypertension   . Hypogonadism male 04/08/2012  . Impaired glucose tolerance 10/08/2011  . Morbid obesity ([place]) 04/02/2014  . Myocardial infarction [facility]) age 60  . OSA (obstructive sleep apnea)    CPAP  . Peripheral vascular disease ([place])   . S/P   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 3/66/2947   33 mm St Jude bileaflet mechanical valve placed via right mini thoracotomy approach  . 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    1988 - Dr Blase Mess [facility] in [place], [place]  . Severe mitral regurgitation 10/11/2014  . Testicular cancer [facility])    He was 58 y/o. He had surgical resection and rad tx's.   Marland Kitchen TIA (transient ischemic attack)    age 6  . Tubulovillous adenoma of colon   . Varicose veins   . 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 Venous (peripheral) insufficiency 02/03/2005    Medications:  Infusions:  . sodium chloride Stopped (02/13/19 2300)  . furosemide      Assessment:  58 yo male on chronic Coumadin for afib, mechanical MVR and mechanical AVR.  INR goal 2.5-3.5.  Today's INR down to 2.2 and pharmacy asked to initiate IV heparin.  Also continues on Coumadin.  PTA Coumadin dose 5 mg daily except 2.5 mg on Mondays.  Recently held dose 6/9 (PTA) for supratherapeutic INR of 4.7.  No overt bleeding or complications noted, Hgb low but stable, Pltc WNL.  Goal of Therapy:  INR 2-3 Heparin level 0.3-0.7 units/ml Monitor platelets by anticoagulation protocol: Yes   Plan:  Start IV Heparin at rate of 1050 units/hr. Check heparin level 6 hrs after gtt starts. Daily heparin level and CBC. Warfarin 7.5 mg x 1 tonight. Daily INR.  Marguerite Olea, [facility] Clinical Pharmacist Phone [phone]  02/14/2019 10:16 AM

## 2019-02-14 NOTE — Progress Notes (Signed)
Orthopedic Tech Progress Note Patient Details:  Lance Cook 1961-04-08 [id number]  Ortho Devices Type of Ortho Device: Haematologist Ortho Device/Splint Interventions: Application, Ordered, Adjustment   Post Interventions Patient Tolerated: Well Instructions Provided: Care of device, Adjustment of device   Melony Overly T 02/14/2019, 10:56 AM

## 2019-02-14 NOTE — Evaluation (Signed)
Physical Therapy Evaluation Patient Details Name: Lance Cook MRN: [id number] DOB: 02-Aug-1961 Today's Date: 02/14/2019   History of Present Illness  58 yo admitted with HF exacerbation. PMHx: CHF, AVR, MVR, CAD, AFib, AA dissection, DM, HTN, PVD, testicular CA, CVA at 58yo  Clinical Impression  Pt pleasant and reports functional decline over the last 29months. He requires assist from wife at home for transfers and has not walked community distances for over a year. Pt with decreased strength, function and balance who will benefit from acute therapy to maximize mobility, function and strength to decrease burden of care. Pt declined OOB to chair due to back pain this session.     Follow Up Recommendations Home health PT    Equipment Recommendations  None recommended by PT , Pt requesting a stool with tall handle like in medical offices for bed mobility   Recommendations for Other Services       Precautions / Restrictions Precautions Precautions: Fall      Mobility  Bed Mobility Overal bed mobility: Needs Assistance Bed Mobility: Supine to Sit;Sit to Supine     Supine to sit: Min guard;HOB elevated Sit to supine: Min assist   General bed mobility comments: minguard with HOB 20 degrees into sitting. Assist for bil LE onto surface to return to supine. trendelenburg with pt grasping rails to slide to [facility]  Transfers Overall transfer level: Modified independent               General transfer comment: pt able to sit and stand from bed but reports taller bed and having to pull himself onto a step to climb in bed at home  Ambulation/Gait Ambulation/Gait assistance: Min guard Gait Distance (Feet): 60 Feet Assistive device: Rolling walker (2 wheeled) Gait Pattern/deviations: Step-through pattern;Decreased stride length;Wide base of support   Gait velocity interpretation: <1.8 ft/sec, indicate of risk for recurrent falls General Gait Details: pt with increased sway with gait due  to bil LE edema and body habitus  Stairs            Wheelchair Mobility    Modified Rankin (Stroke Patients Only)       Balance Overall balance assessment: Needs assistance   Sitting balance-Leahy Scale: Good       Standing balance-Leahy Scale: Fair                               Pertinent Vitals/Pain Pain Assessment: 0-10 Pain Score: 6  Pain Location: chronic back pain Pain Descriptors / Indicators: Aching Pain Intervention(s): Limited activity within patient's tolerance;Premedicated before session;Repositioned    Home Living Family/patient expects to be discharged to:: Private residence Living Arrangements: Spouse/significant other Available Help at Discharge: Family Type of Home: House Home Access: Stairs to enter Entrance Stairs-Rails: Right Entrance Stairs-Number of Steps: 5 Home Layout: One level Home Equipment: Environmental consultant - 2 wheels;Shower seat;Wheelchair - manual Additional Comments: pt assists wife at home with cooking and function depending on day wife with poor vision    Prior Function Level of Independence: Independent with assistive device(s);Needs assistance   Gait / Transfers Assistance Needed: pt normally furniture walks, doesn't walk far other than to get to his car. Chronic bil LE edema and back pain. wife assists to pull him into sitting from bed and standing  ADL's / Homemaking Assistance Needed: wife assists        Hand Dominance        Extremity/Trunk Assessment   Upper  Extremity Assessment Upper Extremity Assessment: Generalized weakness    Lower Extremity Assessment Lower Extremity Assessment: Generalized weakness(bil LE edema with increased width of stance)    Cervical / Trunk Assessment Cervical / Trunk Assessment: Kyphotic  Communication   Communication: No difficulties  Cognition Arousal/Alertness: Awake/alert Behavior During Therapy: WFL for tasks assessed/performed Overall Cognitive Status: Within  Functional Limits for tasks assessed                                        General Comments      Exercises     Assessment/Plan    PT Assessment Patient needs continued PT services  PT Problem List Decreased strength;Decreased mobility;Decreased activity tolerance;Decreased balance;Decreased knowledge of use of DME;Cardiopulmonary status limiting activity       PT Treatment Interventions Gait training;Therapeutic exercise;Patient/family education;Functional mobility training;DME instruction;Therapeutic activities    PT Goals (Current goals can be found in the Care Plan section)  Acute Rehab PT Goals Patient Stated Goal: return home PT Goal Formulation: With patient Time For Goal Achievement: 02/28/19 Potential to Achieve Goals: Fair    Frequency Min 3X/week   Barriers to discharge Decreased caregiver support      Co-evaluation               AM-PAC PT "6 Clicks" Mobility  Outcome Measure Help needed turning from your back to your side while in a flat bed without using bedrails?: A Little Help needed moving from lying on your back to sitting on the side of a flat bed without using bedrails?: A Little Help needed moving to and from a bed to a chair (including a wheelchair)?: A Little Help needed standing up from a chair using your arms (e.g., wheelchair or bedside chair)?: A Little Help needed to walk in hospital room?: A Little Help needed climbing 3-5 steps with a railing? : A Lot 6 Click Score: 17    End of Session Equipment Utilized During Treatment: Oxygen Activity Tolerance: Patient tolerated treatment well Patient left: in bed;with call bell/phone within reach Nurse Communication: Mobility status PT Visit Diagnosis: Muscle weakness (generalized) (M62.81);Other abnormalities of gait and mobility (R26.89)    Time: 4742-5956 PT Time Calculation (min) (ACUTE ONLY): 31 min   Charges:   PT Evaluation $PT Eval Moderate Complexity: 1 Mod PT  Treatments $Therapeutic Activity: 8-22 mins        Morse Brueggemann Pam Drown, PT Acute Rehabilitation Services Pager: [phone] Office: [phone]   [name] B [name] 02/14/2019, 2:18 PM

## 2019-02-15 DIAGNOSIS — E11649 Type 2 diabetes mellitus with hypoglycemia without coma: Secondary | ICD-10-CM

## 2019-02-15 DIAGNOSIS — G894 Chronic pain syndrome: Secondary | ICD-10-CM

## 2019-02-15 LAB — DIGOXIN LEVEL: Digoxin Level: 1.3 ng/mL (ref 0.8–2.0)

## 2019-02-15 LAB — BASIC METABOLIC PANEL
Anion gap: 9 (ref 5–15)
BUN: 30 mg/dL — ABNORMAL HIGH (ref 6–20)
CO2: 35 mmol/L — ABNORMAL HIGH (ref 22–32)
Calcium: 7.9 mg/dL — ABNORMAL LOW (ref 8.9–10.3)
Chloride: 87 mmol/L — ABNORMAL LOW (ref 98–111)
Creatinine, Ser: 0.98 mg/dL (ref 0.61–1.24)
GFR calc Af Amer: >60 mL/min (ref >60)
GFR calc non Af Amer: >60 mL/min (ref >60)
Glucose, Bld: 87 mg/dL (ref 70–99)
Potassium: 3.1 mmol/L — ABNORMAL LOW (ref 3.5–5.1)
Sodium: 131 mmol/L — ABNORMAL LOW (ref 135–145)

## 2019-02-15 LAB — CBC
HCT: 30.9 % — ABNORMAL LOW (ref 39.0–52.0)
Hemoglobin: 8.8 g/dL — ABNORMAL LOW (ref 13.0–17.0)
MCH: 20 pg — ABNORMAL LOW (ref 26.0–34.0)
MCHC: 28.5 g/dL — ABNORMAL LOW (ref 30.0–36.0)
MCV: 70.2 fL — ABNORMAL LOW (ref 80.0–100.0)
Platelets: 188 10*3/uL (ref 150–400)
RBC: 4.4 MIL/uL (ref 4.22–5.81)
RDW: 19.5 % — ABNORMAL HIGH (ref 11.5–15.5)
WBC: 8.1 10*3/uL (ref 4.0–10.5)
nRBC: 0 % (ref 0.0–0.2)

## 2019-02-15 LAB — GLUCOSE, CAPILLARY
Glucose-Capillary: 56 mg/dL — ABNORMAL LOW (ref 70–99)
Glucose-Capillary: 111 mg/dL — ABNORMAL HIGH (ref 70–99)
Glucose-Capillary: 73 mg/dL (ref 70–99)
Glucose-Capillary: 85 mg/dL (ref 70–99)
Glucose-Capillary: 93 mg/dL (ref 70–99)

## 2019-02-15 LAB — PROTIME-INR
INR: 2.8 — ABNORMAL HIGH (ref 0.8–1.2)
Prothrombin Time: 29.4 seconds — ABNORMAL HIGH (ref 11.4–15.2)

## 2019-02-15 LAB — HEPARIN LEVEL (UNFRACTIONATED): Heparin Unfractionated: <0.1 IU/mL — ABNORMAL LOW (ref 0.30–0.70)

## 2019-02-15 MED ORDER — MUPIROCIN 2 % EX OINT
1.0000 "application " | TOPICAL_OINTMENT | Freq: Three times a day (TID) | CUTANEOUS | Status: DC
  Administered 2019-02-15 – 2019-02-27 (×34): 1 via TOPICAL
  Filled 2019-02-15: qty 44
  Filled 2019-02-15 (×6): qty 22

## 2019-02-15 MED ORDER — POTASSIUM CHLORIDE CRYS ER 20 MEQ PO TBCR
40.0000 meq | EXTENDED_RELEASE_TABLET | Freq: Two times a day (BID) | ORAL | Status: DC
  Administered 2019-02-15: 40 meq via ORAL
  Filled 2019-02-15 (×2): qty 2

## 2019-02-15 MED ORDER — WARFARIN SODIUM 5 MG PO TABS
5.0000 mg | ORAL_TABLET | Freq: Once | ORAL | Status: AC
  Administered 2019-02-15: 5 mg via ORAL
  Filled 2019-02-15: qty 1

## 2019-02-15 MED ORDER — BARRIER CREAM NON-SPECIFIED
1.0000 "application " | TOPICAL_CREAM | Freq: Two times a day (BID) | TOPICAL | Status: DC | PRN
  Filled 2019-02-15: qty 1

## 2019-02-15 MED ORDER — ASPIRIN EC 81 MG PO TBEC
81.0000 mg | DELAYED_RELEASE_TABLET | Freq: Every day | ORAL | Status: DC
  Administered 2019-02-15 – 2019-02-27 (×13): 81 mg via ORAL
  Filled 2019-02-15 (×13): qty 1

## 2019-02-15 NOTE — Progress Notes (Signed)
[place] for warfarin Indication: atrial fibrillation, mech AVR, mech MVR  Allergies  Allergen Reactions  . Penicillins Hives    Has patient had a PCN reaction causing immediate rash, facial/tongue/throat swelling, SOB or lightheadedness with hypotension: Yes Has patient had a PCN reaction causing severe rash involving mucus membranes or skin necrosis: Yes Has patient had a PCN reaction that required hospitalization unsure Has patient had a PCN reaction occurring within the last 10 years: 2010-2012 If all of the above answers are "NO", then may proceed with Cephalosporin use.  . Adhesive [Tape] Other (See Comments)    Skin irritation  . Bactrim [Sulfamethoxazole-Trimethoprim] Other (See Comments)    CDIFF, when taking oral tablets    Patient Measurements: Height: 5\' 1"  (154.9 cm) Weight: 216 lb (98 kg) IBW/kg (Calculated) : 52.3 Heparin Dosing Weight: 75.6 kg  Vital Signs: Temp: 98.3 F (36.8 C) (06/16 0542) Temp Source: Oral (06/16 0542) BP: 113/61 (06/16 0542) Pulse Rate: 57 (06/16 0542)  Labs: Recent Labs    02/13/19 1512 02/14/19 0608 02/14/19 2050 02/15/19 0416  HGB 9.9* 9.6*  --  8.8*  HCT 35.3* 33.3*  --  30.9*  PLT 219 206  --  188  LABPROT 27.1* 24.4*  --  29.4*  INR 2.6* 2.2*  --  2.8*  HEPARINUNFRC  --   --  <0.10* <0.10*  CREATININE 1.00 1.04  --   --     Estimated Creatinine Clearance: 78.3 mL/min (by C-G formula based on SCr of 1.04 mg/dL).   Medical History: Past Medical History:  Diagnosis Date  . Acute on chronic diastolic CHF (congestive heart failure) ([place]) 08/27/2016  . Anemia   . Anxiety   . ARDS (adult respiratory distress syndrome) ([place]) 01/27/2015  . Arthritis   . Ascending aortic dissection ([place]) 07/14/2008   Localized dissection of ascending aorta noted on CTA in 2009 and stable on CTA in 2011  . Asymptomatic chronic venous hypertension 01/15/2010   Overview:  Overview:  Qualifier: Diagnosis of   By: [name] MD, [name]   Last Assessment & Plan:  I suggested he go to a vein clinic to see if he could be qualified compression hose of some type, since he is not a surgical candidate according to the vascular surgeons.   . Atrial fibrillation ([place])    chronic persistent  . Bell's palsy   . C. difficile diarrhea   . CAD (coronary artery disease)    Old scar inferior wall myoview, 10/2009 EF 52%.  He did have previous SVG to RCA but no obstructive disease noted on his most recent cath.  SVG occluded.   . Cellulitis 04/02/2014  . Cerebral artery occlusion with cerebral infarction ([place]) 10/08/2011   Overview:  Overview:  And hx of TIA prior to CABG, all thought due to systemic emboli prior to coumadin   . Chronic diastolic heart failure ([place]) 03/01/2015   a. 12/2015: echo showing a preserved EF of 65-70%, moderate AS, and moderate TR.   Marland Kitchen Chronic LBP 10/08/2011  . CVA (cerebral vascular accident) ([place]) 10/08/2011   And hx of TIA prior to CABG, all thought due to systemic emboli prior to coumadin   . DIABETES MELLITUS, TYPE II 11/01/2009   Qualifier: Diagnosis of  By: [name] MD, [name]    . Diverticulosis   . 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  Encephalopathy acute 02/05/2015  . Gastric polyps   . GERD (gastroesophageal reflux disease)    not needing medication at thhis time- 01/22/15  . Gout   . H/O mechanical aortic valve replacement 11/01/2009   Qualifier: Diagnosis of  By: [name], [name]    . Hiatal hernia   . History of colon polyps 12/23/2011   Overview:  Overview:  Colonoscopy January 2010, 6 mm rectal tubulovillous adenoma. No high-grade dysplasia   . HTN (hypertension) 02/23/2014  . Hyperlipidemia   . Hypertension   . Hypogonadism male 04/08/2012  . Impaired glucose tolerance 10/08/2011  . Morbid obesity ([place]) 04/02/2014  . Myocardial infarction [facility])  age 76  . OSA (obstructive sleep apnea)    CPAP  . Peripheral vascular disease ([place])   . 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 01/16/6159   33 mm St Jude bileaflet mechanical valve placed via right mini thoracotomy approach  . 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 Severe mitral regurgitation 10/11/2014  . Testicular cancer [facility])    He was 58 y/o. He had surgical resection and rad tx's.   Marland Kitchen TIA (transient ischemic attack)    age 54  . Tubulovillous adenoma of colon   . Varicose veins   . 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 Venous (peripheral) insufficiency 02/03/2005    Medications:  Infusions:  . sodium chloride Stopped (02/13/19 2300)  . furosemide      Assessment:  57 yo male on chronic Coumadin for afib, mechanical MVR and mechanical AVR.  INR goal 2.5-3.5.    PTA Coumadin dose 5 mg daily except 2.5 mg on Mondays.  Recently held dose 6/9 (PTA) for supratherapeutic INR of 4.7.  INR therapeutic today and heparin gtt held initially d/t some bleeding around IV site per RN, now discontinued by primary with therapeutic INR on warfarin.    Goal of Therapy:  INR 2-3 Heparin level 0.3-0.7 units/ml Monitor platelets by anticoagulation protocol: Yes   Plan:  Give warfarin 5mg  PO x 1 tonight Daily INR, monitor bleeding  Bertis Ruddy, PharmD Clinical Pharmacist Please check AMION for all [place] numbers 02/15/2019 8:52 AM

## 2019-02-15 NOTE — Discharge Summary (Addendum)
Name: Lance Cook MRN: [id number] DOB: 11-Jul-1961 58 y.o. PCP: [name], MD  Date of Admission: 02/13/2019  2:38 PM Date of Discharge: 02/27/2019 Attending Physician: Aldine Contes  Discharge Diagnosis: 1. Acute on chronic heart failure exacerbation 2. Atrial fibrillation 3. Mechanical MVR and AVR 4. NSVT 5. Hypokalemia 6. Microcytic anemia 7. Hypervolemic hyponatremia 8. Chronic pain syndrome  Discharge Medications: Allergies as of 02/27/2019      Reactions   Penicillins Hives   Has patient had a PCN reaction causing immediate rash, facial/tongue/throat swelling, SOB or lightheadedness with hypotension: Yes Has patient had a PCN reaction causing severe rash involving mucus membranes or skin necrosis: Yes Has patient had a PCN reaction that required hospitalization unsure Has patient had a PCN reaction occurring within the last 10 years: 2010-2012 If all of the above answers are "NO", then may proceed with Cephalosporin use.   Adhesive [tape] Other (See Comments)   Skin irritation   Bactrim [sulfamethoxazole-trimethoprim] Other (See Comments)   CDIFF, when taking oral tablets      Medication List    STOP taking these medications   atenolol 100 MG tablet Commonly known as: TENORMIN   digoxin 0.25 MG tablet Commonly known as: LANOXIN   furosemide 80 MG tablet Commonly known as: LASIX   metolazone 2.5 MG tablet Commonly known as: ZAROXOLYN   potassium chloride 10 MEQ tablet Commonly known as: K-DUR     TAKE these medications   allopurinol 300 MG tablet Commonly known as: ZYLOPRIM Take 300 mg by mouth daily. Notes to patient: Today    aspirin 81 MG EC tablet Take 1 tablet (81 mg total) by mouth daily. Notes to patient: Tomorrow    bumetanide 2 MG tablet Commonly known as: BUMEX Take 1 tablet (2 mg total) by mouth 2 (two) times daily.   gabapentin 100 MG capsule Commonly known as: NEURONTIN Take 200-400 mg by mouth 3 (three) times daily. Dose  is dependent on level of leg pain   magnesium oxide 400 (241.3 Mg) MG tablet Commonly known as: MAG-OX Take 1 tablet (400 mg total) by mouth 2 (two) times daily.   methocarbamol 500 MG tablet Commonly known as: ROBAXIN Take 1 tablet (500 mg total) by mouth 3 (three) times daily. What changed:   how much to take  additional instructions   metoprolol succinate 25 MG 24 hr tablet Commonly known as: TOPROL-XL Take 1.5 tablets (37.5 mg total) by mouth 2 (two) times daily.   mupirocin ointment 2 % Commonly known as: BACTROBAN Apply 1 application topically 2 (two) times daily as needed (infection prevention).   oxyCODONE 5 MG immediate release tablet Commonly known as: Oxy IR/ROXICODONE Take 1 tablet (5 mg total) by mouth every 8 (eight) hours as needed for severe pain. What changed: when to take this Notes to patient: Last given at 2 pm today    potassium chloride SA 20 MEQ tablet Commonly known as: K-DUR Take 2 tablets (40 mEq total) by mouth 3 (three) times daily.   PRESCRIPTION MEDICATION Inhale into the lungs at bedtime. CPAP   silver sulfADIAZINE 1 % cream Commonly known as: SILVADENE Apply 1 application topically daily as needed (wound care).   traZODone 100 MG tablet Commonly known as: DESYREL Take 100 mg by mouth at bedtime.   warfarin 5 MG tablet Commonly known as: COUMADIN Take as directed. If you are unsure how to take this medication, talk to your nurse or doctor. Original instructions: 5 mg daily, and 2.5 mg  on Mondays What changed: See the new instructions. Notes to patient: Take the 5 mg this Monday, then starting the following Monday (7/6) start taking 2.5        Disposition and follow-up:   Lance Cook was discharged from [facility] in Stable condition.  At the hospital follow up visit please address:  1.  AoCHF exacerbation: Discharged with bumex 2 mg BID, stopped lasix and metolazone. [place] services set up. Weight 198 lbs on  discharge. Still fluid overloaded on exam so should continue to diurese. Should have cardiology follow up.  Atrial fibrillation: Started on aspirin 81 mg daily, warfarin, and switched from atenolol to metoprolol 37.5 mg BID Hypokalemia: K 4.1 at discharge, started K-dur 40 mg TID on discharge.  Microcytic anemia: Hgb 8.6 at discharge, no evidence of bleeding Hypervolemic hyponatremia: Na 130 on discharge, likely due to hypervolemia  2.  Labs / imaging needed at time of follow-up: CBC, BMP  3.  Pending labs/ test needing follow-up: None  Follow-up Appointments: Follow-up Information    [name], MD. Schedule an appointment as soon as possible for a visit in 1 week(s).   Specialty: Internal Medicine       [name], MD Follow up.   Specialty: Cardiology Why: Our office will call you to schedule a follow-up visit. If you do not hear from [place] by Wednesday 03/02/2019, please call our office. Contact information: [address] [place] [address] [phone]           Hospital Course by problem list: 1. Acute on chronic heart failure exacerbation: This is a 58 year old male with history of heart failure with preserved ejection fraction, mechanical AVR status post Bentall procedure 1988, severe mitral regurgitation status post mechanical valve repair in May 2016, CAD status post SVG-RCA, chronic A. fib, ascending aortic dissection, diabetes mellitus, hypertension, OSA, PVD, testicular cancer status post radiation tx and surgical resection who presented with worsening lower extremity edema, weight gain, worsening shortness of breath.  Been taking Lasix on 20 mg twice daily and metolazone 2.5 mg every other day patient.  Noted to be significantly fluid overloaded on exam.  Initially there was concern about possible severe prosthetic aortic valve stenosis, however patient's transaortic gradients have been elevated chronically, initially to evaluate mechanical valvular  function with gated CT angiogram of the heart however after continued diuresis and further discussion with patient was noted that patient was nonadherent to diet or fluid restriction.  Patient was aggressively diuresed with IV Lasix and metolazone initially, patient was having significant hypokalemia and metolazone was discontinued.  While admitted patient had a net output of 26 L.  Went from 218 pounds to 198 pounds.  Diuretics were adjusted and he was transitioned to Bumex 2 mg twice daily, continued to have diuresis with this regimen and.  Cardiology recommended continuing this on discharge.  Lasix and metolazone were discontinued and patient was continued on Bumex 2 mg twice daily.  2. Atrial fibrillation: 3. Mechanical MVR and AVR: Patient remained rate controlled with atenolol 25 mg twice daily.  He was on warfarin at home and initially was subtherapeutic on admission so had a course of heparin.  However this was able to be discontinued and INR remained therapeutic.  Patient had been on digoxin, digoxin level on admission was 2.6.  Initially digoxin was held after discussion with cardiology they recommended discontinuing this on discharge due to worsening renal function and the fact that he is rate controlled.  Patient was not on aspirin and we started aspirin 81 mg daily.  His transition from atenolol to metoprolol 37.5 mg twice daily.  Patient was discharged home on metoprolol 37.5 mg BID, ASA 81 and warfarin.  4. NSVT: Patient was having episodes of NSVT while admitted, he remained asymptomatic and electrolytes were repleted.  Likely due to electrolyte abnormalities.  5. Hypokalemia: Patient was being diuresed with metolazone higher doses of Lasix during admission.  He was treated with multiple doses of IV potassium and oral potassium.  Metolazone and Lasix had been discontinued and patient was transitioned to Bumex for his diuretics.  Patient was treated with K-Dur 40 mg 3 times daily and potassium  remained stable at 4.1 on discharge.  6. Microcytic anemia: Baseline hemoglobin of 9-10, had some decreased of hemoglobin down to 8.4.  He initially was placed on heparin due to subtherapeutic INR.  After this was discontinued patient had no evidence of bleeding and hemoglobin remained stable.  7. Hypervolemic hyponatremia: Sodium of 126 on admission, likely due to hypervolemic hyponatremia from fluid overload.  Had some improvement with diuresis but remained on the lower side.  Sodium was130on discharge.  8. Chronic pain syndrome: On oxycodone, gabapentin, and Robaxin at home.  We continued this during admission.  Discharge Vitals:   BP 115/67 (BP Location: Right Arm)   Pulse 74   Temp 98.3 F (36.8 C) (Oral)   Resp 20   Ht 5\' 1"  (1.549 m)   Wt 90 kg   SpO2 100%   BMI 37.51 kg/m   Pertinent Labs, Studies, and Procedures:  CBC Latest Ref Rng & Units 02/23/2019 02/22/2019 02/21/2019  WBC 4.0 - 10.5 K/uL 7.5 8.8 7.7  Hemoglobin 13.0 - 17.0 g/dL 8.6(L) 9.2(L) 9.1(L)  Hematocrit 39.0 - 52.0 % 30.0(L) 32.0(L) 32.1(L)  Platelets 150 - 400 K/uL 188 220 209   BMP Latest Ref Rng & Units 02/27/2019 02/26/2019 02/25/2019  Glucose 70 - 99 mg/dL 83 84 73  BUN 6 - 20 mg/dL 31(H) 32(H) 28(H)  Creatinine 0.61 - 1.24 mg/dL 0.88 0.93 1.00  BUN/Creat Ratio 9 - 20 - - -  Sodium 135 - 145 mmol/L 130(L) 132(L) 130(L)  Potassium 3.5 - 5.1 mmol/L 4.1 3.2(L) 3.9  Chloride 98 - 111 mmol/L 88(L) 90(L) 87(L)  CO2 22 - 32 mmol/L 35(H) 34(H) 35(H)  Calcium 8.9 - 10.3 mg/dL 7.9(L) 7.5(L) 7.9(L)     Discharge Instructions: Discharge Instructions    (HEART FAILURE PATIENTS) Call MD:  Anytime you have any of the following symptoms: 1) 3 pound weight gain in 24 hours or 5 pounds in 1 week 2) shortness of breath, with or without a dry hacking cough 3) swelling in the hands, feet or stomach 4) if you have to sleep on extra pillows at night in order to breathe.   Complete by: As directed    Call MD for:  difficulty  breathing, headache or visual disturbances   Complete by: As directed    Call MD for:  extreme fatigue   Complete by: As directed    Call MD for:  hives   Complete by: As directed    Call MD for:  persistant dizziness or light-headedness   Complete by: As directed    Call MD for:  redness, tenderness, or signs of infection (pain, swelling, redness, odor or green/yellow discharge around incision site)   Complete by: As directed    Call MD for:  severe uncontrolled pain   Complete by: As  directed    Call MD for:  temperature >100.4   Complete by: As directed    Diet - low sodium heart healthy   Complete by: As directed    Increase activity slowly   Complete by: As directed       Signed: [name], MD 02/28/2019, 8:53 PM   Pager: [phone]

## 2019-02-15 NOTE — Progress Notes (Signed)
ANTICOAGULATION CONSULT NOTE - Follow Up Consult  Pharmacy Consult for heparin Indication: Afib/MVR/AVR  Labs: Recent Labs    02/13/19 1512 02/14/19 0608 02/14/19 2050 02/15/19 0416  HGB 9.9* 9.6*  --  8.8*  HCT 35.3* 33.3*  --  30.9*  PLT 219 206  --  188  LABPROT 27.1* 24.4*  --  29.4*  INR 2.6* 2.2*  --  2.8*  HEPARINUNFRC  --   --  <0.10* <0.10*  CREATININE 1.00 1.04  --   --     Assessment: 58yo male remains subtherapeutic on heparin after rate change; no gtt issues per RN though she notes some minor bleeding from a previously present sore on pt's leg this am.  Goal of Therapy:  Heparin level 0.3-0.7 units/ml   Plan:  Will increase heparin gtt by 3 units/kg/hr to 1500 units/hr and check level in 6 hours.    Wynona Neat, PharmD, BCPS  02/15/2019,5:37 AM

## 2019-02-15 NOTE — Progress Notes (Signed)
   Contacted by IM regarding digoxin. Initial level was 2.6, held and now is 1.3. They wish cardiology to address.   Will hold tonight, recheck in am. Decide then if it should be continued.  Rosaria Ferries, PA-C 02/15/2019 1:54 PM [place] [phone]

## 2019-02-15 NOTE — Progress Notes (Addendum)
Progress Note  Patient Name: Lance Cook Date of Encounter: 02/15/2019  Primary Cardiologist:  [name], MD  Subjective   Breathing a little better, LE edema created because he was walking much more than usual, taking care of his wife after a large CVA. Denies dietary or fluid indiscretion.  Still w/ SOB w/ minimal exertion, feels he needs O2  chronic leg pain, wants to put the lotion on more often  Inpatient Medications    Scheduled Meds:  aspirin EC  81 mg Oral Daily   gabapentin  200-400 mg Oral TID   Gerhardt's butt cream   Topical BID   insulin aspart  0-15 Units Subcutaneous TID WC   methocarbamol  500 mg Oral TID   metolazone  5 mg Oral Daily   mupirocin ointment  1 application Topical TID   oxyCODONE  5 mg Oral Q8H   sodium chloride flush  10-40 mL Intracatheter Q12H   traZODone  100 mg Oral QHS   warfarin  5 mg Oral ONCE-1800   Warfarin - Pharmacist Dosing Inpatient   Does not apply q1800   Continuous Infusions:  sodium chloride Stopped (02/13/19 2300)   furosemide     PRN Meds: sodium chloride, acetaminophen **OR** acetaminophen, barrier cream, promethazine, senna-docusate, sodium chloride flush   Vital Signs    Vitals:   02/14/19 1707 02/14/19 1957 02/15/19 0057 02/15/19 0542  BP: (!) 109/57 114/69 113/68 113/61  Pulse: (!) 56 (!) 53 (!) 51 (!) 57  Resp: 18 (!) 22 20 16   Temp: 98.2 F (36.8 C) 98.7 F (37.1 C) 98 F (36.7 C) 98.3 F (36.8 C)  TempSrc: Oral Oral Oral Oral  SpO2: 100% 100% 100% 100%  Weight:    98 kg  Height:        Intake/Output Summary (Last 24 hours) at 02/15/2019 8338 Last data filed at 02/15/2019 0800 Gross per 24 hour  Intake 1102 ml  Output 1975 ml  Net -873 ml   Filed Weights   02/13/19 2031 02/14/19 0355 02/15/19 0542  Weight: 99.3 kg 98.2 kg 98 kg   Last Weight  Most recent update: 02/15/2019  5:46 AM   Weight  98 kg (216 lb)           Weight change: -0.907 kg   Telemetry    Afib, occ  PVCs and short runs NSVT - Personally Reviewed  ECG    None today - Personally Reviewed  Physical Exam   General: Well developed, obese, male appearing in no acute distress at rest on Oxygen. Head: Normocephalic, atraumatic.  Neck: Supple without bruits, JVD mildly elevated. Lungs:  Resp regular and unlabored, decreased BS bases, few rales. Heart: Irreg R&R, S1, S2, no S3, S4, crisp valve sounds for Aortic and mitral valves, 3/6 murmur; no rub. Abdomen: Soft, non-tender, non-distended with normoactive bowel sounds. No hepatomegaly. No rebound/guarding. No obvious abdominal masses. Extremities: No clubbing, cyanosis, diffuse, severe LE edema with chronic stasis changes. Distal pedal pulses are not palpable due to edema bilaterally. Neuro: Alert and oriented X 3. Moves all extremities spontaneously. Psych: Normal affect.  Labs    Hematology Recent Labs  Lab 02/13/19 1512 02/14/19 0608 02/15/19 0416  WBC 13.8* 11.3* 8.1  RBC 5.04 4.81 4.40  HGB 9.9* 9.6* 8.8*  HCT 35.3* 33.3* 30.9*  MCV 70.0* 69.2* 70.2*  MCH 19.6* 20.0* 20.0*  MCHC 28.0* 28.8* 28.5*  RDW 19.9* 19.8* 19.5*  PLT 219 206 188    Chemistry Recent Labs  Lab 02/13/19 1512 02/14/19 0608  NA 126* 130*  K 5.3* 3.7  CL 89* 88*  CO2 30 34*  GLUCOSE 97 80  BUN 41* 37*  CREATININE 1.00 1.04  CALCIUM 7.6* 7.9*  GFRNONAA >60 >60  GFRAA >60 >60  ANIONGAP 7 8     BNP Recent Labs  Lab 02/13/19 1504  BNP 223.8*    Lab Results  Component Value Date   INR 2.8 (H) 02/15/2019   INR 2.2 (H) 02/14/2019   INR 2.6 (H) 02/13/2019     Radiology    Dg Chest Portable 1 View  Result Date: 02/13/2019 CLINICAL DATA:  To ed via [facility] from home with c/o fluid overload - building for "a couple weeks" -- has been gaining weight also. Lower legs swollen, edema to groin area also. EXAM: PORTABLE CHEST 1 VIEW COMPARISON:  01/14/2018 FINDINGS: Cardiac silhouette is mildly enlarged. There changes from cardiac surgery and  valve replacement, stable. No mediastinal or hilar masses. No convincing adenopathy. Bilateral prominent bronchovascular markings similar to the prior study. Suspect a small right pleural effusion. No lung consolidation. No pneumothorax. Skeletal structures are grossly intact. IMPRESSION: 1. No acute cardiopulmonary disease. No evidence pulmonary edema. Chronically prominent bronchovascular markings stable. 2. Stable mild cardiomegaly and changes from prior cardiac surgery. Electronically Signed   By: Lajean Manes M.D.   On: 02/13/2019 16:25     Cardiac Studies   ECHO:  01/31/2019  1. The left ventricle has normal systolic function, with an ejection fraction of 55-60%. The cavity size was normal. Left ventricular diastolic function could not be evaluated due to mitral valve replacement/repair. There is abnormal septal motion  consistent with left bundle branch block.  2. Mild hypokinesis of the left ventricular, entire septal wall.  3. The right ventricle has normal systolic function. The cavity was normal. There is no increase in right ventricular wall thickness. Right ventricular systolic pressure is severely elevated with an estimated pressure of 62.4 mmHg.  4. Left atrial size was severely dilated.  5. Right atrial size was severely dilated.  6. A 33 St. Jude mechanical valve is present in the mitral position. Procedure Date: 01/24/2015 There is no Echo evidence of rocking of the mitral prosthesis. Echo findings are consistent with perivalvular leak the mitral prosthesis.  7. Mitral valve regurgitation mild perivalvular. Mitral valve gradient appropriate for prosthesis mitral valve stenosis.  8. The tricuspid valve is grossly normal. Tricuspid valve regurgitation is moderate.  9. A 1mm St. Jude mechanical prosthesis valve is present in the aortic position. Normal aortic valve prosthesis. Echo findings are consistent with Mean gradient ~30 mmHg of the aortic prosthesis. 10. The aortic arch is  normal in size and structure. 11. There is dilatation of the ascending aorta measuring 38 mm. 12. Post Bentall 1988. 13. The inferior vena cava was dilated in size with <50% respiratory variability.   Patient Profile     58 y.o. male w/ hx mech AVR & Bentall 1988, MVR 2016, perm Afib, D-CHF, OSA on CPAP, venous insufficiency, chronic pain issues, chronic anticoag, testicular CA s/p XRT & surgery, was admitted 06/14 with CHF  Assessment & Plan    COVID NEGATIVE   1. Acute on Chronic diastolic CHF:  - Wt down 3 lbs, now 216 lbs - wt on 06/05 was 205 lbs - continue diuresis with Lasix 120 mg IV bid and metolazone 5 mg before a.m. dose  2. Vent ectopy and short runs NSVT - supplement K+, keep approx 4  3. Anemia: - H&H gradually trending down over the last year - per IM  4. Chronic Afib - rate ok  5. Chronic anticoag w/ coumadin - INR is therapeutic  Otherwise, per IM Active Problems:   Acute on chronic heart failure ([place])   Pressure injury of skin   Signed, Rosaria Ferries , PA-C 9:07 AM 02/15/2019 Pager: [phone]  Patient seen and examined   I agree with findings as noted above by R Barrett The pt says at home he is very busy caring for wife   When feels woozy, which is often will take a little extra salt.    Weight has gone down. Thirsty a lot  Mouth like cotton in am  On exam: Lungs:  Decreased BS at base Cardiac exam:  Irreg irreg  Crisp valve sounds Ext with 2+ edema  WIll diurese with IV lasix and metalozone   Follow BP  May need something like midodrine for BP ato avoid woozy sensation and increased salt intake   Dorris Carnes MD

## 2019-02-15 NOTE — Progress Notes (Signed)
Subjective:  Patient requested that he get all his pain medication in groups. He reports that his breathing is a little better today but is still having some shortness of breath. He also reports that he has been having some bleeding from his right arm where the IV line is placed, and was having some pain near the left IV site.   Objective:  Vital signs in last 24 hours: Vitals:   02/15/19 0057 02/15/19 0542 02/15/19 0907 02/15/19 1245  BP: 113/68 113/61 111/60 112/70  Pulse: (!) 51 (!) 57 (!) 109 60  Resp: 20 16  18   Temp: 98 F (36.7 C) 98.3 F (36.8 C) 98.1 F (36.7 C) 98.4 F (36.9 C)  TempSrc: Oral Oral Oral Oral  SpO2: 100% 100% 91% 100%  Weight:  98 kg    Height:       General: Middle aged male, sitting in bed, NAD Cardiac: RRR, systolic murmur, 2-3 + BL LE edema Pulmonary: Normal work of breathing, decreased breath sounds, minimal bibasilar crackles Skin: BL LE dermis stasis changes, lotion in place  Assessment/Plan:  Active Problems:   Acute on chronic heart failure (HCC)   Pressure injury of skin  This is a 58 year old male with a history of half-pack, mechanical AVR status post Bentall procedure 1988, severe mitral regurgitation status post mechanical valve in May 2016, CAD status post SVG-RCA, chronic A. fib, ascending aortic dissection, diabetes mellitus, hypertension, OSA on CPAP, PVD, testicular cancer status post medication treatment and surgical resection who presented with worsening lower extremity edema. Noted to have an acute on chronic heart failure.   Acute on chronic heart failure exacerbation: Diuresing well on IV lasix and metolazone. He reports symptomatic improvement. Intake of 1.1 L, output of 2.6 L, and net output of 1.5 L. Weight down to 216 this morning, same as yesterday but down from 219 on admission. Echocardiogram showed low/normal LV systolic function, cardiology recommends that it would be reasonable to evaluate mechanical valvular function  with a gated CT angiogram of the heart however only after adequate diuresis.  -Cardiology following, appreciate recommendations: Continue lasix 120 mg IV BID and metolazone 5 mg before AM dose. May need midodrine for BP to avoid woozy sensation and increased Na intake.  -Continue IV lasix 120 mg BID -Continue metolazone 2.5 mg prior to first dose -Daily BMP -Strict I+Os -Daily weights -Unna boots were in place however patient reports pain and bleeding with this so he refused -PT/OT rec HH PT, no further OT follow up  Afib Mechanical AVR Mechanical MVR: Rate controlled with atenolol 25 mg BID. On warfarin at home, was subtherapeutic on admission so heparin was started. Today INR was 2.8. He was having some bleeding today, will hold heparin for now and continue warfarin. Will add aspirin today. Digoxin level was elevated on admission to 2.6, today it's improved to 1.3.   -Continue atenolol 25 mg BID -Discontinue heparin -Continue warfarin -Add ASA today -Discuss with cardiology about resuming digoxin, they recommended holding it for now, he is currently rate controlled and they are not sure if digoxin will be beneficial, he also has had some worsening renal function and digoxin is renally cleared, will readdress tomorrow.   Microcytic anemia: Baseline hgb around 9-10, today it's 8.8, slight drop from yesterday of 9.6. Was put on heparin due to subtherapeutic INR, this was discontinued today.  Monitor daily CBC  Hypervolemic hyponatremia: Due to volume overload. Na 126 on admission, improved to 130. BMP pending today.  -  Continue diuresis  DM: A1c 5.2. Had an episode of hypoglycemia this morning, down to 56, got a free pop which improved his sugars. Will continue to monitor.   Chronic pain syndrome: -Continue oxycodone, gabapentin and robaxin. Patient can take all together.   Dispo: Anticipated discharge is pending clinical improvement.   [name], MD 02/15/2019, 1:07 PM  Pager: Pager: [phone]

## 2019-02-15 NOTE — Progress Notes (Signed)
Patient states he uses bactroban 3x at home.  Order for 2x prn.  Paged imts to see if order can be changed

## 2019-02-15 NOTE — Progress Notes (Signed)
Hypoglycemic Event  CBG: 56  Treatment: patient requested freeze pop  Symptoms: none  Follow-up CBG: Time: 5498 CBG Result: 111  Possible Reasons for Event:    Comments/MD notified:    Lance Cook

## 2019-02-16 DIAGNOSIS — E876 Hypokalemia: Secondary | ICD-10-CM

## 2019-02-16 LAB — BASIC METABOLIC PANEL
Anion gap: 9 (ref 5–15)
BUN: 26 mg/dL — ABNORMAL HIGH (ref 6–20)
CO2: 35 mmol/L — ABNORMAL HIGH (ref 22–32)
Calcium: 7.6 mg/dL — ABNORMAL LOW (ref 8.9–10.3)
Chloride: 87 mmol/L — ABNORMAL LOW (ref 98–111)
Creatinine, Ser: 0.83 mg/dL (ref 0.61–1.24)
GFR calc Af Amer: >60 mL/min (ref >60)
GFR calc non Af Amer: >60 mL/min (ref >60)
Glucose, Bld: 76 mg/dL (ref 70–99)
Potassium: 2.8 mmol/L — ABNORMAL LOW (ref 3.5–5.1)
Sodium: 131 mmol/L — ABNORMAL LOW (ref 135–145)

## 2019-02-16 LAB — CBC
HCT: 31.2 % — ABNORMAL LOW (ref 39.0–52.0)
Hemoglobin: 8.9 g/dL — ABNORMAL LOW (ref 13.0–17.0)
MCH: 19.7 pg — ABNORMAL LOW (ref 26.0–34.0)
MCHC: 28.5 g/dL — ABNORMAL LOW (ref 30.0–36.0)
MCV: 69 fL — ABNORMAL LOW (ref 80.0–100.0)
Platelets: 179 10*3/uL (ref 150–400)
RBC: 4.52 MIL/uL (ref 4.22–5.81)
RDW: 19.3 % — ABNORMAL HIGH (ref 11.5–15.5)
WBC: 8 10*3/uL (ref 4.0–10.5)
nRBC: 0 % (ref 0.0–0.2)

## 2019-02-16 LAB — GLUCOSE, CAPILLARY
Glucose-Capillary: 89 mg/dL (ref 70–99)
Glucose-Capillary: 76 mg/dL (ref 70–99)
Glucose-Capillary: 76 mg/dL (ref 70–99)
Glucose-Capillary: 80 mg/dL (ref 70–99)

## 2019-02-16 LAB — MAGNESIUM: Magnesium: 1.6 mg/dL — ABNORMAL LOW (ref 1.7–2.4)

## 2019-02-16 LAB — PROTIME-INR
INR: 3.8 — ABNORMAL HIGH (ref 0.8–1.2)
Prothrombin Time: 36.6 seconds — ABNORMAL HIGH (ref 11.4–15.2)

## 2019-02-16 LAB — DIGOXIN LEVEL: Digoxin Level: 0.8 ng/mL (ref 0.8–2.0)

## 2019-02-16 MED ORDER — POTASSIUM CHLORIDE 10 MEQ/100ML IV SOLN
10.0000 meq | INTRAVENOUS | Status: AC
  Administered 2019-02-16 (×3): 10 meq via INTRAVENOUS
  Filled 2019-02-16 (×3): qty 100

## 2019-02-16 MED ORDER — POTASSIUM CHLORIDE CRYS ER 20 MEQ PO TBCR
60.0000 meq | EXTENDED_RELEASE_TABLET | Freq: Four times a day (QID) | ORAL | Status: AC
  Administered 2019-02-16 (×2): 60 meq via ORAL
  Filled 2019-02-16 (×2): qty 3

## 2019-02-16 MED ORDER — WARFARIN SODIUM 2.5 MG PO TABS
2.5000 mg | ORAL_TABLET | Freq: Once | ORAL | Status: AC
  Administered 2019-02-16: 2.5 mg via ORAL
  Filled 2019-02-16: qty 1

## 2019-02-16 NOTE — Progress Notes (Signed)
Patient is on NIV at this time per patient request, no distress or complications noted

## 2019-02-16 NOTE — Plan of Care (Signed)
  Problem: Education: Goal: Ability to demonstrate management of disease process will improve Outcome: Progressing Goal: Ability to verbalize understanding of medication therapies will improve Outcome: Progressing Goal: Individualized Educational Video(s) Outcome: Progressing   Problem: Cardiac: Goal: Ability to achieve and maintain adequate cardiopulmonary perfusion will improve Outcome: Progressing   Problem: Activity: Goal: Capacity to carry out activities will improve Outcome: Progressing   Problem: Education: Goal: Knowledge of General Education information will improve Description: Including pain rating scale, medication(s)/side effects and non-pharmacologic comfort measures Outcome: Progressing   Problem: Clinical Measurements: Goal: Ability to maintain clinical measurements within normal limits will improve Outcome: Progressing Goal: Will remain free from infection Outcome: Progressing Goal: Diagnostic test results will improve Outcome: Progressing Goal: Respiratory complications will improve Outcome: Progressing Goal: Cardiovascular complication will be avoided Outcome: Progressing   Problem: Elimination: Goal: Will not experience complications related to bowel motility Outcome: Progressing Goal: Will not experience complications related to urinary retention Outcome: Progressing   Problem: Pain Managment: Goal: General experience of comfort will improve Outcome: Progressing   Problem: Skin Integrity: Goal: Risk for impaired skin integrity will decrease Outcome: Progressing   Problem: Safety: Goal: Ability to remain free from injury will improve Outcome: Progressing

## 2019-02-16 NOTE — Progress Notes (Signed)
[place] for warfarin Indication: atrial fibrillation, mech AVR, mech MVR  Allergies  Allergen Reactions  . Penicillins Hives    Has patient had a PCN reaction causing immediate rash, facial/tongue/throat swelling, SOB or lightheadedness with hypotension: Yes Has patient had a PCN reaction causing severe rash involving mucus membranes or skin necrosis: Yes Has patient had a PCN reaction that required hospitalization unsure Has patient had a PCN reaction occurring within the last 10 years: 2010-2012 If all of the above answers are "NO", then may proceed with Cephalosporin use.  . Adhesive [Tape] Other (See Comments)    Skin irritation  . Bactrim [Sulfamethoxazole-Trimethoprim] Other (See Comments)    CDIFF, when taking oral tablets    Patient Measurements: Height: 5\' 1"  (154.9 cm) Weight: 211 lb 11.2 oz (96 kg) IBW/kg (Calculated) : 52.3 Heparin Dosing Weight: 75.6 kg  Vital Signs: Temp: 98.6 F (37 C) (06/17 0848) Temp Source: Oral (06/17 0848) BP: 124/75 (06/17 0848) Pulse Rate: 67 (06/17 0848)  Labs: Recent Labs    02/14/19 0608 02/14/19 2050 02/15/19 0416 02/15/19 1430 02/16/19 0709  HGB 9.6*  --  8.8*  --  8.9*  HCT 33.3*  --  30.9*  --  31.2*  PLT 206  --  188  --  179  LABPROT 24.4*  --  29.4*  --  36.6*  INR 2.2*  --  2.8*  --  3.8*  HEPARINUNFRC  --  <0.10* <0.10*  --   --   CREATININE 1.04  --   --  0.98 0.83    Estimated Creatinine Clearance: 96.9 mL/min (by C-G formula based on SCr of 0.83 mg/dL).   Medical History: Past Medical History:  Diagnosis Date  . Acute on chronic diastolic CHF (congestive heart failure) ([place]) 08/27/2016  . Anemia   . Anxiety   . ARDS (adult respiratory distress syndrome) ([place]) 01/27/2015  . Arthritis   . Ascending aortic dissection ([place]) 07/14/2008   Localized dissection of ascending aorta noted on CTA in 2009 and stable on CTA in 2011  . Asymptomatic chronic venous hypertension  01/15/2010   Overview:  Overview:  Qualifier: Diagnosis of  By: [name] MD, [name]   Last Assessment & Plan:  I suggested he go to a vein clinic to see if he could be qualified compression hose of some type, since he is not a surgical candidate according to the vascular surgeons.   . Atrial fibrillation ([place])    chronic persistent  . Bell's palsy   . C. difficile diarrhea   . CAD (coronary artery disease)    Old scar inferior wall myoview, 10/2009 EF 52%.  He did have previous SVG to RCA but no obstructive disease noted on his most recent cath.  SVG occluded.   . Cellulitis 04/02/2014  . Cerebral artery occlusion with cerebral infarction ([place]) 10/08/2011   Overview:  Overview:  And hx of TIA prior to CABG, all thought due to systemic emboli prior to coumadin   . Chronic diastolic heart failure ([place]) 03/01/2015   a. 12/2015: echo showing a preserved EF of 65-70%, moderate AS, and moderate TR.   Marland Kitchen Chronic LBP 10/08/2011  . CVA (cerebral vascular accident) ([place]) 10/08/2011   And hx of TIA prior to CABG, all thought due to systemic emboli prior to coumadin   . DIABETES MELLITUS, TYPE II 11/01/2009   Qualifier: Diagnosis of  By: [name] MD, [name]    . Diverticulosis   . ED (erectile dysfunction) of  organic origin 10/08/2011   Overview:  Last Assessment & Plan:  S/p unilateral orchiectomy, for testosterone level, consider androgel pump 1.62    . Encephalopathy acute 02/05/2015  . Gastric polyps   . GERD (gastroesophageal reflux disease)    not needing medication at thhis time- 01/22/15  . Gout   . H/O mechanical aortic valve replacement 11/01/2009   Qualifier: Diagnosis of  By: [name], [name]    . Hiatal hernia   . History of colon polyps 12/23/2011   Overview:  Overview:  Colonoscopy January 2010, 6 mm rectal tubulovillous adenoma. No high-grade dysplasia   . HTN (hypertension) 02/23/2014  . Hyperlipidemia   . Hypertension   . Hypogonadism male 04/08/2012  . Impaired glucose tolerance 10/08/2011  .  Morbid obesity ([place]) 04/02/2014  . Myocardial infarction [facility]) age 45  . OSA (obstructive sleep apnea)    CPAP  . Peripheral vascular disease ([place])   . 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 01/01/7740   33 mm St Jude bileaflet mechanical valve placed via right mini thoracotomy approach  . 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    1988 - Dr Blase Mess at [facility] in [place], [place]  . Severe mitral regurgitation 10/11/2014  . Testicular cancer [facility])    He was 58 y/o. He had surgical resection and rad tx's.   Marland Kitchen TIA (transient ischemic attack)    age 10  . Tubulovillous adenoma of colon   . Varicose veins   . 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 Venous (peripheral) insufficiency 02/03/2005    Medications:  Infusions:  . sodium chloride Stopped (02/13/19 2300)  . furosemide 120 mg (02/16/19 0838)  . potassium chloride      Assessment:  58 yo male on chronic Coumadin for afib, mechanical MVR and mechanical AVR.  INR goal 2.5-3.5.    PTA Coumadin dose 5 mg daily except 2.5 mg on Mondays.  Recently held dose 6/9 (PTA) for supratherapeutic INR of 4.7.  INR slightly supratherapeutic today at 3.8,  CBC stable and no bleeding reported.    Goal of Therapy:  INR 2.5-3.5 Monitor platelets by anticoagulation protocol: Yes   Plan:  Warfarin 2.5mg  PO x 1 tonight Daily INR, s/s bleeding  Bertis Ruddy, PharmD Clinical Pharmacist Please check AMION for all [place] numbers 02/16/2019 9:31 AM

## 2019-02-16 NOTE — Care Management Important Message (Signed)
Important Message  Patient Details  Name: Lance Cook MRN: [id number] Date of Birth: [birth year]   Medicare Important Message Given:  Yes    [name] 02/16/2019, 12:00 PM

## 2019-02-16 NOTE — Progress Notes (Signed)
   Subjective:   Patient states he "puts out a decent amount of fluid yesterday". Feels better after midline was removed. States that he feels his legs more and has been able to move around room more easily. Legs have been itchy, but gets relief from the mupirocin.   Objective:  Vital signs in last 24 hours: Vitals:   02/15/19 1245 02/15/19 1953 02/16/19 0428 02/16/19 0438  BP: 112/70 121/63 (!) 106/56   Pulse: 60 73 (!) 55   Resp: 18 20 20    Temp: 98.4 F (36.9 C) 98.2 F (36.8 C) 97.9 F (36.6 C)   TempSrc: Oral Oral Oral   SpO2: 100% 94% 95%   Weight:    96 kg  Height:        General: Middle aged male, NAD, sitting up in bed Cardiac: RRR, systolic murmur with click, 2+ BL LE edema (improved) Pulmonary: Bibasilar crackles, normal work of breathing, on 2L [place] Abdomen: Soft, non-tender, non-distended Skin: BL LE stasis dermatitis changes  Assessment/Plan:  Active Problems:   Acute on chronic heart failure (HCC)   Pressure injury of skin  This is a 58 year old male with a history of half-pack, mechanical AVR status post Bentall procedure 1988, severe mitral regurgitation status postmechanicalvalve in May 2016, CAD status post SVG-RCA, chronic A. fib, ascending aortic dissection, diabetes mellitus, hypertension, OSA on CPAP, PVD, testicular cancer status post medication treatment and surgical resection who presented with worsening lower extremity edema. Noted to have an acute on chronic heart failure.   Acute on chronic HFpEF (EF of 50-55%) Patient has been having good diuresis and good output. Net output of 2.7L over the past 24 hours, weight down to 211 lbs from 216 lb yesterday. His renal function remains stable, with Cr of 0.83.  Echocardiogram showed a low/normal systolic function. BP has been stable today, he reports symptomatic improvement.   -Cardiology following, appreciate recommendations -Continue metolazone and lasix 120 mg BID -Strict I+Os -Daily weights -Daily  BMP -PT/OT rec HH PT, no further OT follow up  Afib Mechanical AVR and MVR: Currently rate controlled on atenolol 25 mg BID. He is on warfarin at home, INR today 3.8. Started him on ASA, he was not on this prior to his admission. Digoxin level today was 0.8, appreciate cardiology input about restarting digoxin.   -Continue atenolol 25 mg BID -Continue ASA 81 mg daily -Continue home warfarin, per pharmacy -Digoxin per cardiology  Microcytic anemia: Hgb stable today at 8.9, no evidence of bleeding today. On ASA and warfarin now. Will continue to monitor for now. -Monitor CBC  Hypokalemia  K=2.8 today. Repleting with K-dur and IV K. Will continue to monitor and replete as needed.    Leg edema  continue mupirocin   Hypervolemic hyponatremia: Due to volume overload. Na 126 on admission, improved to 130. BMP today showed Na 131, improving with diuresis.  -Continue diuresis  DM: A1c 5.2. Will continue to monitor. CBGs ranged from 76-89.   Chronic pain syndrome: -Continue oxycodone, gabapentin and robaxin. Patient can take all together.   FEN: No fluids, replete lytes prn, HH diet  VTE ppx: Lovenox Warfarin Code Status: FULL   Dispo: Anticipated discharge is pending clinical improvement.   [name], MD 02/16/2019, 6:46 AM Pager: [phone]

## 2019-02-16 NOTE — Progress Notes (Signed)
Progress Note  Patient Name: Lance Cook Date of Encounter: 02/16/2019  Primary Cardiologist: [name], MD   Subjective   Patient feeling better than on admit  No CP  Inpatient Medications    Scheduled Meds: . aspirin EC  81 mg Oral Daily  . gabapentin  200-400 mg Oral TID  . Gerhardt's butt cream   Topical BID  . insulin aspart  0-15 Units Subcutaneous TID WC  . methocarbamol  500 mg Oral TID  . metolazone  5 mg Oral Daily  . mupirocin ointment  1 application Topical TID  . oxyCODONE  5 mg Oral Q8H  . potassium chloride  40 mEq Oral BID  . sodium chloride flush  10-40 mL Intracatheter Q12H  . traZODone  100 mg Oral QHS  . Warfarin - Pharmacist Dosing Inpatient   Does not apply q1800   Continuous Infusions: . sodium chloride Stopped (02/13/19 2300)  . furosemide 120 mg (02/15/19 1803)   PRN Meds: sodium chloride, acetaminophen **OR** acetaminophen, barrier cream, promethazine, senna-docusate, sodium chloride flush   Vital Signs    Vitals:   02/15/19 1245 02/15/19 1953 02/16/19 0428 02/16/19 0438  BP: 112/70 121/63 (!) 106/56   Pulse: 60 73 (!) 55   Resp: 18 20 20    Temp: 98.4 F (36.9 C) 98.2 F (36.8 C) 97.9 F (36.6 C)   TempSrc: Oral Oral Oral   SpO2: 100% 94% 95%   Weight:    96 kg  Height:        Intake/Output Summary (Last 24 hours) at 02/16/2019 0815 Last data filed at 02/16/2019 0733 Gross per 24 hour  Intake 1654.05 ml  Output 4400 ml  Net -2745.95 ml   Filed Weights   02/14/19 0355 02/15/19 0542 02/16/19 0438  Weight: 98.2 kg 98 kg 96 kg    Telemetry    Afib - Personally Reviewed  Physical Exam   Physical exam per MD:  GEN: No acute distress.   Neck: No JVD, no carotid bruits Cardiac: Irreg irreg, Crjlsp valve sounds , rubs, or gallops.  Respiratory: Clear to auscultation bilaterally, no wheezes/ rales/ rhonchi GI: NABS, Soft, nontender, non-distended  MS: 2+ edema; No deformity.  Labs    Chemistry Recent Labs  Lab  02/13/19 1512 02/14/19 0608 02/15/19 1430  NA 126* 130* 131*  K 5.3* 3.7 3.1*  CL 89* 88* 87*  CO2 30 34* 35*  GLUCOSE 97 80 87  BUN 41* 37* 30*  CREATININE 1.00 1.04 0.98  CALCIUM 7.6* 7.9* 7.9*  GFRNONAA >60 >60 >60  GFRAA >60 >60 >60  ANIONGAP 7 8 9      Hematology Recent Labs  Lab 02/14/19 0608 02/15/19 0416 02/16/19 0709  WBC 11.3* 8.1 8.0  RBC 4.81 4.40 4.52  HGB 9.6* 8.8* 8.9*  HCT 33.3* 30.9* 31.2*  MCV 69.2* 70.2* 69.0*  MCH 20.0* 20.0* 19.7*  MCHC 28.8* 28.5* 28.5*  RDW 19.8* 19.5* 19.3*  PLT 206 188 179    Cardiac EnzymesNo results for input(s): TROPONINI in the last 168 hours. No results for input(s): TROPIPOC in the last 168 hours.   BNP Recent Labs  Lab 02/13/19 1504  BNP 223.8*     DDimer No results for input(s): DDIMER in the last 168 hours.   Radiology    No results found.  Cardiac Studies   ECHO:  01/31/2019 1. The left ventricle has normal systolic function, with an ejection fraction of 55-60%. The cavity size was normal. Left ventricular diastolic function could  not be evaluated due to mitral valve replacement/repair. There is abnormal septal motion  consistent with left bundle branch block. 2. Mild hypokinesis of the left ventricular, entire septal wall. 3. The right ventricle has normal systolic function. The cavity was normal. There is no increase in right ventricular wall thickness. Right ventricular systolic pressure is severely elevated with an estimated pressure of 62.4 mmHg. 4. Left atrial size was severely dilated. 5. Right atrial size was severely dilated. 6. A 33 St. Jude mechanical valve is present in the mitral position. Procedure Date: 01/24/2015 There is no Echo evidence of rocking of the mitral prosthesis. Echo findings are consistent with perivalvular leak the mitral prosthesis. 7. Mitral valve regurgitation mild perivalvular. Mitral valve gradient appropriate for prosthesis mitral valve stenosis. 8. The tricuspid  valve is grossly normal. Tricuspid valve regurgitation is moderate. 9. A 10mm St. Jude mechanical prosthesis valve is present in the aortic position. Normal aortic valve prosthesis. Echo findings are consistent with Mean gradient ~30 mmHg of the aortic prosthesis. 10. The aortic arch is normal in size and structure. 11. There is dilatation of the ascending aorta measuring 38 mm. 12. Post Bentall 1988. 13. The inferior vena cava was dilated in size with <50% respiratory variability  Patient Profile     58 y.o. male w/ hx mech AVR & Bentall 1988, MVR 2016, perm Afib, D-CHF, OSA on CPAP, venous insufficiency, chronic pain issues, chronic anticoag, testicular CA s/p XRT & surgery, was admitted 06/14 with CHF  Assessment & Plan    1. Acute on chronic diastolic CHF: diuresing with IV lasix and metolazone. UOP net -2.7L in the past 24 hours and -5.1L this admission. Weight downtrending nicely - 218lbs on admission to 211lbs today.  Baseline in 180s Cr stable at 0.8. Still with still with signif volume increase on exam today - Continue aggressive diuresis with IV lasix and metolazone - Continue to monitor daily weight and strict I&Os - Will replete K this AM (2.8 on AM labs) - goal K>4, Mg>2. Will add-on Mg to morning labs Pt admits to maybe not always following low andiet  Will have dietary review/educate   2. NSVT: short runs noted on telemetry. K 2.8 on BMET this AM.  - Aggressive electrolyte repletion to maintain K >4, Mg>2  3. Atrial fibrillation Permanent: rate well controlled this admission not on AV nodal blocking agents. Home atenolol held to allow room in BP for titration of diuretics. INR up to 3.8 today. Digoxin level elevated to 2.6 02/13/2019 and digoxin held, trended down 1.3>0.8.  - Continue coumadin per pharmacy - I would recomm stopping digoxin  Follow HR  Risk not worth potential benefit  4  AV dz   S/p mechanical AVR and Bentall procedure in 1988  2  MV dz/  S/p 33 mm St Jude  mechanical MVR MVR in 2016   Will have dietary consult   Educate on 2G Na diet    For questions or updates, please contact [place] Please consult [email or web address] for contact info under Cardiology/STEMI.      Signed, Abigail Butts, PA-C  02/16/2019, 8:15 AM   [phone]

## 2019-02-16 NOTE — Progress Notes (Signed)
Had to wait for IV team restart patient's PIV as patient's IV infiltrated so KCL runs will be given late as we have just now gotten IV access.

## 2019-02-16 NOTE — Progress Notes (Signed)
Physical Therapy Treatment Patient Details Name: Lance Cook MRN: [id number] DOB: 1961-04-30 Today's Date: 02/16/2019    History of Present Illness 58 yo admitted with HF exacerbation. PMHx: CHF, AVR, MVR, CAD, AFib, AA dissection, DM, HTN, PVD, testicular CA, CVA at 58yo    PT Comments    Pt refuses to come to EoB due to fatigue from 2x ambulation to bathroom and pain in R IV site. Pt requires increased encouragement to participate in bed level exercise. Pt able to perform LE Exercise in pain reduced range. Pt educated on need to use LE muscles to aid in reducing LE fluid build up. Pt agrees to increase ankle pumps. D/c plans remain appropriate at this time. PT will continue to follow acutely.     Follow Up Recommendations  Home health PT     Equipment Recommendations  None recommended by PT    Recommendations for Other Services       Precautions / Restrictions Precautions Precautions: Fall Restrictions Weight Bearing Restrictions: No          Cognition Arousal/Alertness: Awake/alert Behavior During Therapy: WFL for tasks assessed/performed Overall Cognitive Status: Within Functional Limits for tasks assessed                                        Exercises General Exercises - Lower Extremity Ankle Circles/Pumps: AROM;Both;20 reps;Supine Quad Sets: AROM;Both;10 reps Gluteal Sets: AROM;Both;10 reps;Supine Heel Slides: AROM;Both;10 reps;Supine Hip ABduction/ADduction: AROM;Both;10 reps;Supine    General Comments General comments (skin integrity, edema, etc.): Bilateral LE edemnatous, with hemosiderin staining      Pertinent Vitals/Pain Pain Assessment: 0-10 Pain Score: 8  Pain Location: chronic back pain, BLE and R IV site Pain Descriptors / Indicators: Aching Pain Intervention(s): Limited activity within patient's tolerance;Monitored during session;Utilized relaxation techniques           PT Goals (current goals can now be found in the care  plan section) Acute Rehab PT Goals Patient Stated Goal: return home PT Goal Formulation: With patient Time For Goal Achievement: 02/28/19 Potential to Achieve Goals: Fair Progress towards PT goals: Not progressing toward goals - comment(fatigued from 2x trips to bathroom, refused OOB )    Frequency    Min 3X/week      PT Plan Current plan remains appropriate       AM-PAC PT "6 Clicks" Mobility   Outcome Measure  Help needed turning from your back to your side while in a flat bed without using bedrails?: A Little Help needed moving from lying on your back to sitting on the side of a flat bed without using bedrails?: A Little Help needed moving to and from a bed to a chair (including a wheelchair)?: A Little Help needed standing up from a chair using your arms (e.g., wheelchair or bedside chair)?: A Little Help needed to walk in hospital room?: A Little Help needed climbing 3-5 steps with a railing? : A Lot 6 Click Score: 17    End of Session Equipment Utilized During Treatment: Oxygen Activity Tolerance: Patient tolerated treatment well Patient left: in bed;with call bell/phone within reach Nurse Communication: Mobility status PT Visit Diagnosis: Muscle weakness (generalized) (M62.81);Other abnormalities of gait and mobility (R26.89)     Time: 5361-4431 PT Time Calculation (min) (ACUTE ONLY): 18 min  Charges:  $Therapeutic Exercise: 8-22 mins  Reyann Troop B. Migdalia Dk PT, DPT Acute Rehabilitation Services Pager [phone] Office [phone]    [place] 02/16/2019, 3:30 PM

## 2019-02-16 NOTE — Plan of Care (Signed)
  Problem: Education: Goal: Ability to demonstrate management of disease process will improve Outcome: Progressing Goal: Ability to verbalize understanding of medication therapies will improve Outcome: Progressing   Problem: Activity: Goal: Capacity to carry out activities will improve Outcome: Progressing   Problem: Cardiac: Goal: Ability to achieve and maintain adequate cardiopulmonary perfusion will improve Outcome: Progressing   Problem: Health Behavior/Discharge Planning: Goal: Ability to manage health-related needs will improve Outcome: Progressing   Problem: Clinical Measurements: Goal: Diagnostic test results will improve Outcome: Progressing Goal: Respiratory complications will improve Outcome: Progressing Goal: Cardiovascular complication will be avoided Outcome: Progressing   Problem: Activity: Goal: Risk for activity intolerance will decrease Outcome: Progressing   Problem: Nutrition: Goal: Adequate nutrition will be maintained Outcome: Progressing   Problem: Elimination: Goal: Will not experience complications related to bowel motility Outcome: Progressing Goal: Will not experience complications related to urinary retention Outcome: Progressing   Problem: Pain Managment: Goal: General experience of comfort will improve Outcome: Progressing   Problem: Safety: Goal: Ability to remain free from injury will improve Outcome: Progressing

## 2019-02-17 DIAGNOSIS — Z7982 Long term (current) use of aspirin: Secondary | ICD-10-CM

## 2019-02-17 LAB — BASIC METABOLIC PANEL
Anion gap: 10 (ref 5–15)
Anion gap: 8 (ref 5–15)
BUN: 25 mg/dL — ABNORMAL HIGH (ref 6–20)
BUN: 20 mg/dL (ref 6–20)
CO2: 35 mmol/L — ABNORMAL HIGH (ref 22–32)
CO2: 39 mmol/L — ABNORMAL HIGH (ref 22–32)
Calcium: 7.7 mg/dL — ABNORMAL LOW (ref 8.9–10.3)
Calcium: 7.9 mg/dL — ABNORMAL LOW (ref 8.9–10.3)
Chloride: 86 mmol/L — ABNORMAL LOW (ref 98–111)
Chloride: 81 mmol/L — ABNORMAL LOW (ref 98–111)
Creatinine, Ser: 0.97 mg/dL (ref 0.61–1.24)
Creatinine, Ser: 0.81 mg/dL (ref 0.61–1.24)
GFR calc Af Amer: >60 mL/min (ref >60)
GFR calc Af Amer: >60 mL/min (ref >60)
GFR calc non Af Amer: >60 mL/min (ref >60)
GFR calc non Af Amer: >60 mL/min (ref >60)
Glucose, Bld: 84 mg/dL (ref 70–99)
Glucose, Bld: 100 mg/dL — ABNORMAL HIGH (ref 70–99)
Potassium: 2.7 mmol/L — CL (ref 3.5–5.1)
Potassium: 3.1 mmol/L — ABNORMAL LOW (ref 3.5–5.1)
Sodium: 131 mmol/L — ABNORMAL LOW (ref 135–145)
Sodium: 128 mmol/L — ABNORMAL LOW (ref 135–145)

## 2019-02-17 LAB — GLUCOSE, CAPILLARY
Glucose-Capillary: 87 mg/dL (ref 70–99)
Glucose-Capillary: 86 mg/dL (ref 70–99)
Glucose-Capillary: 102 mg/dL — ABNORMAL HIGH (ref 70–99)
Glucose-Capillary: 114 mg/dL — ABNORMAL HIGH (ref 70–99)

## 2019-02-17 LAB — CBC
HCT: 32.3 % — ABNORMAL LOW (ref 39.0–52.0)
Hemoglobin: 9.3 g/dL — ABNORMAL LOW (ref 13.0–17.0)
MCH: 19.9 pg — ABNORMAL LOW (ref 26.0–34.0)
MCHC: 28.8 g/dL — ABNORMAL LOW (ref 30.0–36.0)
MCV: 69 fL — ABNORMAL LOW (ref 80.0–100.0)
Platelets: 172 10*3/uL (ref 150–400)
RBC: 4.68 MIL/uL (ref 4.22–5.81)
RDW: 19.1 % — ABNORMAL HIGH (ref 11.5–15.5)
WBC: 7.4 10*3/uL (ref 4.0–10.5)
nRBC: 0 % (ref 0.0–0.2)

## 2019-02-17 LAB — PROTIME-INR
INR: 3.4 — ABNORMAL HIGH (ref 0.8–1.2)
Prothrombin Time: 34 seconds — ABNORMAL HIGH (ref 11.4–15.2)

## 2019-02-17 LAB — ALBUMIN: Albumin: 1.8 g/dL — ABNORMAL LOW (ref 3.5–5.0)

## 2019-02-17 LAB — MAGNESIUM: Magnesium: 1.9 mg/dL (ref 1.7–2.4)

## 2019-02-17 MED ORDER — POTASSIUM CHLORIDE CRYS ER 20 MEQ PO TBCR
40.0000 meq | EXTENDED_RELEASE_TABLET | ORAL | Status: AC
  Administered 2019-02-17 (×2): 40 meq via ORAL
  Filled 2019-02-17 (×2): qty 2

## 2019-02-17 MED ORDER — MAGNESIUM SULFATE 2 GM/50ML IV SOLN
2.0000 g | Freq: Once | INTRAVENOUS | Status: DC
  Administered 2019-02-17: 2 g via INTRAVENOUS
  Filled 2019-02-17: qty 50

## 2019-02-17 MED ORDER — POTASSIUM CHLORIDE CRYS ER 20 MEQ PO TBCR
40.0000 meq | EXTENDED_RELEASE_TABLET | Freq: Every day | ORAL | Status: DC
  Administered 2019-02-17 – 2019-02-18 (×2): 40 meq via ORAL
  Filled 2019-02-17 (×2): qty 2

## 2019-02-17 MED ORDER — POTASSIUM CHLORIDE 10 MEQ/100ML IV SOLN
10.0000 meq | INTRAVENOUS | Status: AC
  Administered 2019-02-17 (×4): 10 meq via INTRAVENOUS
  Filled 2019-02-17 (×4): qty 100

## 2019-02-17 MED ORDER — WARFARIN SODIUM 5 MG PO TABS
5.0000 mg | ORAL_TABLET | Freq: Once | ORAL | Status: AC
  Administered 2019-02-17: 5 mg via ORAL
  Filled 2019-02-17: qty 1

## 2019-02-17 MED ORDER — MAGNESIUM SULFATE 4 GM/100ML IV SOLN
4.0000 g | Freq: Once | INTRAVENOUS | Status: DC
  Filled 2019-02-17: qty 100

## 2019-02-17 MED ORDER — MAGNESIUM SULFATE 2 GM/50ML IV SOLN
2.0000 g | Freq: Once | INTRAVENOUS | Status: AC
  Administered 2019-02-17: 2 g via INTRAVENOUS
  Filled 2019-02-17: qty 50

## 2019-02-17 MED ORDER — POTASSIUM CHLORIDE 10 MEQ/100ML IV SOLN
10.0000 meq | INTRAVENOUS | Status: AC
  Administered 2019-02-17 – 2019-02-18 (×5): 10 meq via INTRAVENOUS
  Filled 2019-02-17 (×5): qty 100

## 2019-02-17 NOTE — Progress Notes (Addendum)
CRITICAL VALUE ALERT  Critical Value:  Potassium 2.7  Date & Time Notied:  02/17/2019 0555  Provider Notified: Sharon Seller  Orders Received/Actions taken: potassium IV x4 PO potassium

## 2019-02-17 NOTE — Progress Notes (Signed)
   Subjective:   The patient stated that he has been urinating a lot. Feels that skin is dried up. Feels that he has been breathing better. Feels like the water goes up in thigh and stops there. No scrotal pain.   Objective:  Vital signs in last 24 hours: Vitals:   02/16/19 1936 02/16/19 2038 02/17/19 0304 02/17/19 0335  BP: (!) 119/49   117/62  Pulse: (!) 146 62  66  Resp: 18   20  Temp: (!) 97 F (36.1 C)   98.4 F (36.9 C)  TempSrc:    Oral  SpO2: 91%   98%  Weight:   92.9 kg   Height:        General: Well appearing male, NAD, sitting in bed Cardiac: RRR, no m/r/g, no appreciable JVD, 2+ BL LE edema Pulmonary: Bibalisar crackles, normal work of breathing, was on CPAP overnight Abdomen: Soft, non-tender, non-distended, normoactive bowel sounds MSK: Bilateral LE dermis stasis   Assessment/Plan:  Active Problems:   Acute on chronic heart failure (HCC)   Pressure injury of skin  This is a 58 year old male with a history of half-pack, mechanical AVR status post Bentall procedure 1988, severe mitral regurgitation status postmechanicalvalve in May 2016, CAD status post SVG-RCA, chronic A. fib, ascending aortic dissection, diabetes mellitus, hypertension, OSA on CPAP, PVD, testicular cancer status post medication treatment and surgical resection who presented with worsening lower extremity edema. Noted to have an acute on chronic heart failure.   Acute on chronic HFpEF (EF of 50-55%) Patient has been having good diuresis and good output.  Total input of 2.6, total output of 6.3, net output of 3.6 L. Weight down to 92 kg today, from 96 yesterday and 99 on admission. Cr continues to remain stable, at 0.97 today. BP remains stable has been diuresing well.  -Cardiology following, appreciate recommendations -Continue metolazone and lasix 120 mg BID -Strict I+Os, patient should not be getting more than 1220ml of fluid daily -Daily weights -Daily BMP -PT/OT rec HH PT, no further  OT follow up  Afib Mechanical AVR and MVR: Remains rate controlled on his home atenolol 25 mg twice daily.  Currently on home warfarin, INR today is 3.4.  Continued aspirin that we had started while here.  Discussed with cardiology about restarting digoxin, they do not feel that it is necessary at this time and we will continue to hold this and discontinue on discharge.  -Continue atenolol 25 mg BID -Continue ASA 81 mg daily -Continue home warfarin, per pharmacy -Continue to hold digoxin  Microcytic anemia: Hgb stable today at 9.3, no evidence of bleeding today. On ASA and warfarin now. Will continue to monitor for now. -Monitor CBC  Hypokalemia  K=2.7 today. Repleting with K-dur and IV K.   -repeat bmp at 1400 to recheck potassium   Hypervolemic hyponatremia: Due to volume overload. Na 126 on admission,improved to 130.  Sodium stable today at 131. -Continue diuresis  DM: A1c 5.2. Will continue to monitor. CBGs ranged around 84-87.  Chronic pain syndrome: -Continue oxycodone, gabapentin and robaxin. Patient can take all together.  FEN: No fluids, replete lytes prn, HH with fluid restriction diet VTE ppx: Lovenox  Code Status: FULL    Dispo: Anticipated discharge is pending clinical improvement.   [name], MD 02/17/2019, 6:32 AM Pager: [phone]

## 2019-02-17 NOTE — Progress Notes (Signed)
[place] for warfarin Indication: atrial fibrillation, mech AVR, mech MVR  Allergies  Allergen Reactions  . Penicillins Hives    Has patient had a PCN reaction causing immediate rash, facial/tongue/throat swelling, SOB or lightheadedness with hypotension: Yes Has patient had a PCN reaction causing severe rash involving mucus membranes or skin necrosis: Yes Has patient had a PCN reaction that required hospitalization unsure Has patient had a PCN reaction occurring within the last 10 years: 2010-2012 If all of the above answers are "NO", then may proceed with Cephalosporin use.  . Adhesive [Tape] Other (See Comments)    Skin irritation  . Bactrim [Sulfamethoxazole-Trimethoprim] Other (See Comments)    CDIFF, when taking oral tablets    Patient Measurements: Height: 5\' 1"  (154.9 cm) Weight: 204 lb 14.4 oz (92.9 kg) IBW/kg (Calculated) : 52.3 Heparin Dosing Weight: 75.6 kg  Vital Signs: Temp: 98.4 F (36.9 C) (06/18 0335) Temp Source: Oral (06/18 0335) BP: 117/62 (06/18 0335) Pulse Rate: 66 (06/18 0335)  Labs: Recent Labs    02/14/19 2050  02/15/19 0416 02/15/19 1430 02/16/19 0709 02/17/19 0432  HGB  --    < > 8.8*  --  8.9* 9.3*  HCT  --   --  30.9*  --  31.2* 32.3*  PLT  --   --  188  --  179 172  LABPROT  --   --  29.4*  --  36.6* 34.0*  INR  --   --  2.8*  --  3.8* 3.4*  HEPARINUNFRC <0.10*  --  <0.10*  --   --   --   CREATININE  --   --   --  0.98 0.83 0.97   < > = values in this interval not displayed.    Estimated Creatinine Clearance: 81.4 mL/min (by C-G formula based on SCr of 0.97 mg/dL).   Medical History: Past Medical History:  Diagnosis Date  . Acute on chronic diastolic CHF (congestive heart failure) ([place]) 08/27/2016  . Anemia   . Anxiety   . ARDS (adult respiratory distress syndrome) ([place]) 01/27/2015  . Arthritis   . Ascending aortic dissection ([place]) 07/14/2008   Localized dissection of ascending aorta noted  on CTA in 2009 and stable on CTA in 2011  . Asymptomatic chronic venous hypertension 01/15/2010   Overview:  Overview:  Qualifier: Diagnosis of  By: [name] MD, [name]   Last Assessment & Plan:  I suggested he go to a vein clinic to see if he could be qualified compression hose of some type, since he is not a surgical candidate according to the vascular surgeons.   . Atrial fibrillation ([place])    chronic persistent  . Bell's palsy   . C. difficile diarrhea   . CAD (coronary artery disease)    Old scar inferior wall myoview, 10/2009 EF 52%.  He did have previous SVG to RCA but no obstructive disease noted on his most recent cath.  SVG occluded.   . Cellulitis 04/02/2014  . Cerebral artery occlusion with cerebral infarction ([place]) 10/08/2011   Overview:  Overview:  And hx of TIA prior to CABG, all thought due to systemic emboli prior to coumadin   . Chronic diastolic heart failure ([place]) 03/01/2015   a. 12/2015: echo showing a preserved EF of 65-70%, moderate AS, and moderate TR.   Marland Kitchen Chronic LBP 10/08/2011  . CVA (cerebral vascular accident) ([place]) 10/08/2011   And hx of TIA prior to CABG, all thought due to systemic  emboli prior to coumadin   . DIABETES MELLITUS, TYPE II 11/01/2009   Qualifier: Diagnosis of  By: [name] MD, [name]    . Diverticulosis   . 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 Encephalopathy acute 02/05/2015  . Gastric polyps   . GERD (gastroesophageal reflux disease)    not needing medication at thhis time- 01/22/15  . Gout   . H/O mechanical aortic valve replacement 11/01/2009   Qualifier: Diagnosis of  By: [name], [name]    . Hiatal hernia   . History of colon polyps 12/23/2011   Overview:  Overview:  Colonoscopy January 2010, 6 mm rectal tubulovillous adenoma. No high-grade dysplasia   . HTN (hypertension) 02/23/2014  . Hyperlipidemia   .  Hypertension   . Hypogonadism male 04/08/2012  . Impaired glucose tolerance 10/08/2011  . Morbid obesity ([place]) 04/02/2014  . Myocardial infarction [facility]) age 2  . OSA (obstructive sleep apnea)    CPAP  . Peripheral vascular disease ([place])   . 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 7/93/9030   33 mm St Jude bileaflet mechanical valve placed via right mini thoracotomy approach  . 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    1988 - Dr Blase Mess at [facility] in [place], [place]  . Severe mitral regurgitation 10/11/2014  . Testicular cancer [facility])    He was 58 y/o. He had surgical resection and rad tx's.   Marland Kitchen TIA (transient ischemic attack)    age 36  . Tubulovillous adenoma of colon   . Varicose veins   . 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 Venous (peripheral) insufficiency 02/03/2005    Medications:  Infusions:  . sodium chloride Stopped (02/16/19 1638)  . furosemide 62 mL/hr at 02/17/19 0923    Assessment:  58 yo male on chronic Coumadin for afib, mechanical MVR and mechanical AVR.  INR goal 2.5-3.5. -INR= 3.4 (trend down), CBC stable     PTA Coumadin dose 5 mg daily except 2.5 mg on Mondays.  Recently held dose 6/9 (PTA) for supratherapeutic INR of 4.7.   Goal of Therapy:  INR 2.5-3.5 Monitor platelets by anticoagulation protocol: Yes   Plan:  Warfarin 5mg  PO x 1 tonight Daily INR  Hildred Laser, PharmD Clinical Pharmacist **Pharmacist phone directory can now be found on [email or web address] ([email or web address]).  Listed under [place].

## 2019-02-17 NOTE — Progress Notes (Signed)
Rectal collection bag used for urine collection has come off patient writer had to order new bag await its arrival meanwhile patient given urinal to collect urine. He has required KCL and Mag IVPB replacement this am for low K+(2.7) and Mag level 1.6 this am. He conts with Lasix mini bag for duiresis he has good urine output with this. No distress noted this time.

## 2019-02-17 NOTE — Plan of Care (Signed)
°  Problem: Education: °Goal: Ability to demonstrate management of disease process will improve °Outcome: Progressing °Goal: Ability to verbalize understanding of medication therapies will improve °Outcome: Progressing °Goal: Individualized Educational Video(s) °Outcome: Progressing °  °

## 2019-02-17 NOTE — Evaluation (Signed)
Occupational Therapy Evaluation Patient Details Name: Lance Cook MRN: [id number] DOB: [birth year] Today's Date: 02/17/2019    History of Present Illness 58 yo admitted with HF exacerbation. PMHx: CHF, AVR, MVR, CAD, AFib, AA dissection, DM, HTN, PVD, testicular CA, CVA at 58yo   Clinical Impression   New OT ordered received for patient. Per patient's chart, he was evaluated by OT previously on 02/15/19. Patient very pleasant during OT evaluation. Voices no new changes in functional status since initially evaled on 6/16. Patient was re-evaluated to determine any new need for OT services. Patient is functioning at baseline for basic ADL tasks and functional transfers. He has assist from his wife at home for ADL tasks as needed. He is aware of his limitations and able to compensate as needed with no concerns for safety. No further OT services at needed at this time. Will sign off.     Follow Up Recommendations  No OT follow up    Equipment Recommendations  None recommended by OT       Precautions / Restrictions Precautions Precautions: Fall Precaution Comments: denies any falls Restrictions Weight Bearing Restrictions: No      Mobility Bed Mobility         Supine to sit: Modified independent (Device/Increase time) Sit to supine: Modified independent (Device/Increase time)   General bed mobility comments: assist for LEs back into bed  Transfers Overall transfer level: Modified independent Equipment used: Rolling walker (2 wheeled)                      ADL either performed or assessed with clinical judgement   ADL   General ADL Comments: pt will baseline dependence, educated in energy conservation and availability of AE for LB ADL, pt not interested     Vision Baseline Vision/History: Wears glasses Wears Glasses: At all times Patient Visual Report: No change from baseline              Pertinent Vitals/Pain Pain Assessment: No/denies pain Pain Score: 0-No  pain     Hand Dominance Right   Extremity/Trunk Assessment Upper Extremity Assessment Upper Extremity Assessment: Overall WFL for tasks assessed   Lower Extremity Assessment Lower Extremity Assessment: Defer to PT evaluation       Communication Communication Communication: No difficulties   Cognition   Behavior During Therapy: [facility] for tasks assessed/performed Overall Cognitive Status: Within Functional Limits for tasks assessed                        Home Living Family/patient expects to be discharged to:: Private residence Living Arrangements: Spouse/significant other Available Help at Discharge: Family;Available 24 hours/day Type of Home: House Home Access: Stairs to enter [employer] of Steps: 5 Entrance Stairs-Rails: Right Home Layout: One level     Bathroom Shower/Tub: Occupational psychologist: Standard     Home Equipment: Environmental consultant - 2 wheels;Shower seat;Wheelchair - manual;Hand held shower head   Additional Comments: wife is visually impaired      Prior Functioning/Environment Level of Independence: Needs assistance  Gait / Transfers Assistance Needed: pt normally furniture walks, doesn't walk far other than to get to his car. Chronic bil LE edema and back pain. wife assists to pull him into sitting from bed and standing ADL's / Homemaking Assistance Needed: wife assists with LB bathing and dressing, dries pt after showering and does much of the IADL  OT Problem List: Decreased activity tolerance      OT Treatment/Interventions:      OT Goals(Current goals can be found in the care plan section) Acute Rehab OT Goals Patient Stated Goal: return home  OT Frequency:     Barriers to D/C:  None             AM-PAC OT "6 Clicks" Daily Activity     Outcome Measure Help from another person eating meals?: None Help from another person taking care of personal grooming?: A Little Help from another person toileting,  which includes using toliet, bedpan, or urinal?: A Little Help from another person bathing (including washing, rinsing, drying)?: A Little Help from another person to put on and taking off regular upper body clothing?: None Help from another person to put on and taking off regular lower body clothing?: A Little 6 Click Score: 20   End of Session Equipment Utilized During Treatment: Oxygen  Activity Tolerance: Patient tolerated treatment well Patient left: in bed;with call bell/phone within reach;with nursing/sitter in room  OT Visit Diagnosis: Other abnormalities of gait and mobility (R26.89);Pain                Time: 0910-0930 OT Time Calculation (min): 20 min Charges:  OT General Charges $OT Visit: 1 Visit OT Evaluation $OT Eval Low Complexity: Lance Cook, OTR/L,CBIS  [phone]   Lance Cook, Lance Cook 02/17/2019, 10:00 AM

## 2019-02-17 NOTE — Progress Notes (Signed)
Progress Note  Patient Name: Lance Cook Date of Encounter: 02/17/2019  Primary Cardiologist: [name], MD   Subjective   Breathing is better   No CP    Inpatient Medications    Scheduled Meds: . aspirin EC  81 mg Oral Daily  . gabapentin  200-400 mg Oral TID  . Gerhardt's butt cream   Topical BID  . insulin aspart  0-15 Units Subcutaneous TID WC  . methocarbamol  500 mg Oral TID  . metolazone  5 mg Oral Daily  . mupirocin ointment  1 application Topical TID  . oxyCODONE  5 mg Oral Q8H  . sodium chloride flush  10-40 mL Intracatheter Q12H  . traZODone  100 mg Oral QHS  . Warfarin - Pharmacist Dosing Inpatient   Does not apply q1800   Continuous Infusions: . sodium chloride Stopped (02/16/19 1638)  . furosemide 120 mg (02/17/19 0823)  . magnesium sulfate bolus IVPB    . potassium chloride 10 mEq (02/17/19 0821)   PRN Meds: sodium chloride, acetaminophen **OR** acetaminophen, barrier cream, promethazine, senna-docusate, sodium chloride flush   Vital Signs    Vitals:   02/16/19 1936 02/16/19 2038 02/17/19 0304 02/17/19 0335  BP: (!) 119/49   117/62  Pulse: (!) 146 62  66  Resp: 18   20  Temp: (!) 97 F (36.1 C)   98.4 F (36.9 C)  TempSrc:    Oral  SpO2: 91%   98%  Weight:   92.9 kg   Height:        Intake/Output Summary (Last 24 hours) at 02/17/2019 0852 Last data filed at 02/17/2019 0700 Gross per 24 hour  Intake 2402.64 ml  Output 7000 ml  Net -4597.36 ml   Filed Weights   02/15/19 0542 02/16/19 0438 02/17/19 0304  Weight: 98 kg 96 kg 92.9 kg    Telemetry    Afib   - Personally Reviewed  Physical Exam   Physical exam per MD:  GEN: No acute distress.   Neck: No JVD, no carotid bruits Cardiac: Irreg irreg  Crisp valve sounds, no rubs, or gallops.  Respiratory: Clear to auscultation bilaterally, no wheezes/ rales/ rhonchi GI: NABS, Soft, nontender, non-distended  MS: 2+ LE edema above knee ; No deformity. Neuro:  Nonfocal, moving all  extremities spontaneously Psych: Normal affect   Labs    Chemistry Recent Labs  Lab 02/15/19 1430 02/16/19 0709 02/17/19 0432  NA 131* 131* 131*  K 3.1* 2.8* 2.7*  CL 87* 87* 86*  CO2 35* 35* 35*  GLUCOSE 87 76 84  BUN 30* 26* 25*  CREATININE 0.98 0.83 0.97  CALCIUM 7.9* 7.6* 7.7*  GFRNONAA >60 >60 >60  GFRAA >60 >60 >60  ANIONGAP 9 9 10      Hematology Recent Labs  Lab 02/15/19 0416 02/16/19 0709 02/17/19 0432  WBC 8.1 8.0 7.4  RBC 4.40 4.52 4.68  HGB 8.8* 8.9* 9.3*  HCT 30.9* 31.2* 32.3*  MCV 70.2* 69.0* 69.0*  MCH 20.0* 19.7* 19.9*  MCHC 28.5* 28.5* 28.8*  RDW 19.5* 19.3* 19.1*  PLT 188 179 172    Cardiac EnzymesNo results for input(s): TROPONINI in the last 168 hours. No results for input(s): TROPIPOC in the last 168 hours.   BNP Recent Labs  Lab 02/13/19 1504  BNP 223.8*     DDimer No results for input(s): DDIMER in the last 168 hours.   Radiology    No results found.  Cardiac Studies   ECHO:01/31/2019 1. The left  ventricle has normal systolic function, with an ejection fraction of 55-60%. The cavity size was normal. Left ventricular diastolic function could not be evaluated due to mitral valve replacement/repair. There is abnormal septal motion  consistent with left bundle branch block. 2. Mild hypokinesis of the left ventricular, entire septal wall. 3. The right ventricle has normal systolic function. The cavity was normal. There is no increase in right ventricular wall thickness. Right ventricular systolic pressure is severely elevated with an estimated pressure of 62.4 mmHg. 4. Left atrial size was severely dilated. 5. Right atrial size was severely dilated. 6. A 33 St. Jude mechanical valve is present in the mitral position. Procedure Date: 01/24/2015 There is no Echo evidence of rocking of the mitral prosthesis. Echo findings are consistent with perivalvular leak the mitral prosthesis. 7. Mitral valve regurgitation mild perivalvular.  Mitral valve gradient appropriate for prosthesis mitral valve stenosis. 8. The tricuspid valve is grossly normal. Tricuspid valve regurgitation is moderate. 9. A 18mm St. Jude mechanical prosthesis valve is present in the aortic position. Normal aortic valve prosthesis. Echo findings are consistent with Mean gradient ~30 mmHg of the aortic prosthesis. 10. The aortic arch is normal in size and structure. 11. There is dilatation of the ascending aorta measuring 38 mm. 12. Post Bentall 1988. 13. The inferior vena cava was dilated in size with <50% respiratory variability  Patient Profile     58 y.o.malew/ hx mech AVR & Bentall 1988, MVR 2016, perm Afib, D-CHF, OSA on CPAP, venous insufficiency, chronic pain issues, chronic anticoag, testicular CA s/p XRT & surgery, was admitted 06/14 with CHF  Assessment & Plan    1. Acute on chronic diastolic CHF: diuresing with IV lasix and metolazone  UOP net -4.3L in the past 24 hours with -9.5L this admission. Weight down to 204lbs from 211lbs yesterday (219lbs on admission). Cr up to 0.97 today from 0.83 yesterday.  - Continue aggressive diuresis with IV lasix and metolazone - Continue to monitor daily weight and strict I&Os - Continue to monitor electrolytes closely and replete to maintain K >4, Mg >2  Discussed Diet again with pt   I think this has signif contribution.  I think he gets a lot of salt as outpt with retention   2. Electrolyte imbalance: Mg 1.6 on labs yesterday and K 2.7 this morning. Ordered for 10 mEq IV K x4 doses and 40 mEq po x2 doses this morning, and Mg 4g IV.  - Check PM BMET/Mg for close monitoring  - Replete to maintain K >4; Mg>2  3. NSVT: short bursts noted on telemetry this admission - Continue aggressive electrolyte repletion as above  4. Permanent atrial fibrillation: rate well controlled without AV nodal blocking agents. Digoxin held on admission given elevated dig level of 2.6>1.3>0.8. Decision made to discontinue  digoxin. On coumadin for stroke ppx with INR  at 3.4 today (goal 2.5-3.5 given valve replacement) - Continue coumadin per pharmacy  5. S/p mechanical aortic and mitral vavle replacement: s/p AVR and Bental procedure in 1988 and MVR in 2016. Mean gradient across AV has been high for several years    INR 3.4 today - Continue coumadin per pharmacy.  For questions or updates, please contact [place] Please consult [email or web address] for contact info under Cardiology/STEMI.      Signed, Abigail Butts, PA-C  02/17/2019, 8:52 AM   [phone]   Patient interviewed by Teodoro Kil remotely  I have seen and examined pt   Agree with findings as noted  above  I have ameneded and added to note  Continue aggressive IV diuresis  Replenish electrolytes Educate on [place] diet.  Dorris Carnes MD

## 2019-02-17 NOTE — Plan of Care (Signed)
  Problem: Activity: Goal: Capacity to carry out activities will improve Outcome: Progressing   Problem: Cardiac: Goal: Ability to achieve and maintain adequate cardiopulmonary perfusion will improve Outcome: Progressing   Problem: Clinical Measurements: Goal: Respiratory complications will improve Outcome: Progressing   Problem: Activity: Goal: Risk for activity intolerance will decrease Outcome: Progressing   Problem: Nutrition: Goal: Adequate nutrition will be maintained Outcome: Progressing   Problem: Pain Managment: Goal: General experience of comfort will improve Outcome: Progressing   Problem: Safety: Goal: Ability to remain free from injury will improve Outcome: Progressing

## 2019-02-17 NOTE — Plan of Care (Signed)
Nutrition Education Note  RD consulted for nutrition education regarding CHF. Specifically consulted for education regarding 2 gram sodium diet with 1500 cc fluid restriction.  Reviewed Cardiology MD's note on 6/17: "pt admits to maybe not always following low sodium diet. Will have dietary review/educate."  RD called pt's room on 3 different occasions to provide education. Pt did not answer any of RD's phone calls.  RD attached  "Low Sodium Nutrition Therapy" handout from the Academy of Nutrition and Dietetics to pt's discharge instructions. Handout provides examples on ways to decrease sodium intake in diet. Handout discourages intake of processed foods and use of salt shaker. Handout encourages fresh fruits and vegetables as well as whole grain sources of carbohydrates to maximize fiber intake.   Handout includes why it is important for patient to adhere to diet recommendations, and emphasizes the role of fluids (pt to d/c on 1500 cc fluid restriction per consult), foods to avoid, and importance of weighing self daily.  Expect fair compliance.  Body mass index is 38.72 kg/m. Pt meets criteria for obesity class II based on current BMI.  Current diet order is Heart Healthy, patient is consuming approximately 100% of meals at this time. Labs and medications reviewed. No further nutrition interventions warranted at this time. RD contact information provided. If additional nutrition issues arise, please re-consult RD.    Gaynell Face, MS, RD, LDN Inpatient Clinical Dietitian Pager: [phone] Weekend/After Hours: [phone]

## 2019-02-17 NOTE — Progress Notes (Signed)
33Orthopedic Tech Progress Note Patient Details:  Lance Cook 11/26/1960 [id number]  Ortho Devices Type of Ortho Device: Haematologist Ortho Device/Splint Location: Bilateral unna boots Ortho Device/Splint Interventions: Application   Post Interventions Patient Tolerated: Well Instructions Provided: Care of device   [name] 02/17/2019, 6:57 PM

## 2019-02-17 NOTE — Progress Notes (Signed)
PT c/o leg pain from unna boots. Pt still has sensation and is able to move toes. Color appropriate and cap refill within normal limits. Provider notified. Pain medicine administered. Provider stated she will come to bedside if there is no relief from medication.

## 2019-02-18 LAB — BASIC METABOLIC PANEL
Anion gap: 9 (ref 5–15)
BUN: 21 mg/dL — ABNORMAL HIGH (ref 6–20)
CO2: 35 mmol/L — ABNORMAL HIGH (ref 22–32)
Calcium: 7.8 mg/dL — ABNORMAL LOW (ref 8.9–10.3)
Chloride: 85 mmol/L — ABNORMAL LOW (ref 98–111)
Creatinine, Ser: 0.95 mg/dL (ref 0.61–1.24)
GFR calc Af Amer: >60 mL/min (ref >60)
GFR calc non Af Amer: >60 mL/min (ref >60)
Glucose, Bld: 83 mg/dL (ref 70–99)
Potassium: 3.6 mmol/L (ref 3.5–5.1)
Sodium: 129 mmol/L — ABNORMAL LOW (ref 135–145)

## 2019-02-18 LAB — GLUCOSE, CAPILLARY
Glucose-Capillary: 76 mg/dL (ref 70–99)
Glucose-Capillary: 89 mg/dL (ref 70–99)
Glucose-Capillary: 79 mg/dL (ref 70–99)
Glucose-Capillary: 118 mg/dL — ABNORMAL HIGH (ref 70–99)

## 2019-02-18 LAB — PROTIME-INR
INR: 3 — ABNORMAL HIGH (ref 0.8–1.2)
Prothrombin Time: 30.5 seconds — ABNORMAL HIGH (ref 11.4–15.2)

## 2019-02-18 LAB — CBC
HCT: 31.6 % — ABNORMAL LOW (ref 39.0–52.0)
Hemoglobin: 9.2 g/dL — ABNORMAL LOW (ref 13.0–17.0)
MCH: 19.9 pg — ABNORMAL LOW (ref 26.0–34.0)
MCHC: 29.1 g/dL — ABNORMAL LOW (ref 30.0–36.0)
MCV: 68.4 fL — ABNORMAL LOW (ref 80.0–100.0)
Platelets: 180 10*3/uL (ref 150–400)
RBC: 4.62 MIL/uL (ref 4.22–5.81)
RDW: 19.6 % — ABNORMAL HIGH (ref 11.5–15.5)
WBC: 9.3 10*3/uL (ref 4.0–10.5)
nRBC: 0 % (ref 0.0–0.2)

## 2019-02-18 LAB — CALCIUM, IONIZED: Calcium, Ionized, Serum: 4.4 mg/dL — ABNORMAL LOW (ref 4.5–5.6)

## 2019-02-18 MED ORDER — GABAPENTIN 100 MG PO CAPS
200.0000 mg | ORAL_CAPSULE | Freq: Three times a day (TID) | ORAL | Status: DC
  Administered 2019-02-18 – 2019-02-26 (×23): 200 mg via ORAL
  Administered 2019-02-26: 400 mg via ORAL
  Administered 2019-02-26 – 2019-02-27 (×2): 200 mg via ORAL
  Administered 2019-02-27: 400 mg via ORAL
  Filled 2019-02-18 (×13): qty 2
  Filled 2019-02-18: qty 4
  Filled 2019-02-18 (×13): qty 2

## 2019-02-18 MED ORDER — POTASSIUM CHLORIDE CRYS ER 20 MEQ PO TBCR
40.0000 meq | EXTENDED_RELEASE_TABLET | Freq: Two times a day (BID) | ORAL | Status: DC
  Administered 2019-02-18 – 2019-02-20 (×4): 40 meq via ORAL
  Filled 2019-02-18 (×4): qty 2

## 2019-02-18 MED ORDER — METHOCARBAMOL 500 MG PO TABS
500.0000 mg | ORAL_TABLET | Freq: Three times a day (TID) | ORAL | Status: DC
  Administered 2019-02-18 – 2019-02-27 (×27): 500 mg via ORAL
  Filled 2019-02-18 (×27): qty 1

## 2019-02-18 MED ORDER — METOPROLOL SUCCINATE ER 25 MG PO TB24
25.0000 mg | ORAL_TABLET | Freq: Two times a day (BID) | ORAL | Status: DC
  Administered 2019-02-18 – 2019-02-22 (×8): 25 mg via ORAL
  Filled 2019-02-18 (×8): qty 1

## 2019-02-18 MED ORDER — WARFARIN SODIUM 5 MG PO TABS
5.0000 mg | ORAL_TABLET | Freq: Once | ORAL | Status: AC
  Administered 2019-02-18: 5 mg via ORAL
  Filled 2019-02-18: qty 1

## 2019-02-18 NOTE — Progress Notes (Signed)
[place] for warfarin Indication: atrial fibrillation, mech AVR, mech MVR  Allergies  Allergen Reactions  . Penicillins Hives    Has patient had a PCN reaction causing immediate rash, facial/tongue/throat swelling, SOB or lightheadedness with hypotension: Yes Has patient had a PCN reaction causing severe rash involving mucus membranes or skin necrosis: Yes Has patient had a PCN reaction that required hospitalization unsure Has patient had a PCN reaction occurring within the last 10 years: 2010-2012 If all of the above answers are "NO", then may proceed with Cephalosporin use.  . Adhesive [Tape] Other (See Comments)    Skin irritation  . Bactrim [Sulfamethoxazole-Trimethoprim] Other (See Comments)    CDIFF, when taking oral tablets    Patient Measurements: Height: 5\' 1"  (154.9 cm) Weight: 204 lb (92.5 kg) IBW/kg (Calculated) : 52.3 Heparin Dosing Weight: 75.6 kg  Vital Signs: Temp: 98.4 F (36.9 C) (06/19 0609) Temp Source: Oral (06/19 0609) BP: 116/66 (06/19 0806) Pulse Rate: 65 (06/19 0806)  Labs: Recent Labs    02/16/19 0709 02/17/19 0432 02/17/19 1427 02/18/19 0645  HGB 8.9* 9.3*  --  9.2*  HCT 31.2* 32.3*  --  31.6*  PLT 179 172  --  180  LABPROT 36.6* 34.0*  --  30.5*  INR 3.8* 3.4*  --  3.0*  CREATININE 0.83 0.97 0.81 0.95    Estimated Creatinine Clearance: 83 mL/min (by C-G formula based on SCr of 0.95 mg/dL).   Medical History: Past Medical History:  Diagnosis Date  . Acute on chronic diastolic CHF (congestive heart failure) ([place]) 08/27/2016  . Anemia   . Anxiety   . ARDS (adult respiratory distress syndrome) ([place]) 01/27/2015  . Arthritis   . Ascending aortic dissection ([place]) 07/14/2008   Localized dissection of ascending aorta noted on CTA in 2009 and stable on CTA in 2011  . Asymptomatic chronic venous hypertension 01/15/2010   Overview:  Overview:  Qualifier: Diagnosis of  By: [name] MD, [name]   Last  Assessment & Plan:  I suggested he go to a vein clinic to see if he could be qualified compression hose of some type, since he is not a surgical candidate according to the vascular surgeons.   . Atrial fibrillation ([place])    chronic persistent  . Bell's palsy   . C. difficile diarrhea   . CAD (coronary artery disease)    Old scar inferior wall myoview, 10/2009 EF 52%.  He did have previous SVG to RCA but no obstructive disease noted on his most recent cath.  SVG occluded.   . Cellulitis 04/02/2014  . Cerebral artery occlusion with cerebral infarction ([place]) 10/08/2011   Overview:  Overview:  And hx of TIA prior to CABG, all thought due to systemic emboli prior to coumadin   . Chronic diastolic heart failure ([place]) 03/01/2015   a. 12/2015: echo showing a preserved EF of 65-70%, moderate AS, and moderate TR.   Marland Kitchen Chronic LBP 10/08/2011  . CVA (cerebral vascular accident) ([place]) 10/08/2011   And hx of TIA prior to CABG, all thought due to systemic emboli prior to coumadin   . DIABETES MELLITUS, TYPE II 11/01/2009   Qualifier: Diagnosis of  By: [name] MD, [name]    . Diverticulosis   . 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 Encephalopathy acute 02/05/2015  . Gastric polyps   . GERD (  gastroesophageal reflux disease)    not needing medication at thhis time- 01/22/15  . Gout   . H/O mechanical aortic valve replacement 11/01/2009   Qualifier: Diagnosis of  By: [name], [name]    . Hiatal hernia   . History of colon polyps 12/23/2011   Overview:  Overview:  Colonoscopy January 2010, 6 mm rectal tubulovillous adenoma. No high-grade dysplasia   . HTN (hypertension) 02/23/2014  . Hyperlipidemia   . Hypertension   . Hypogonadism male 04/08/2012  . Impaired glucose tolerance 10/08/2011  . Morbid obesity ([place]) 04/02/2014  . Myocardial infarction [facility]) age 57  . OSA (obstructive sleep  apnea)    CPAP  . Peripheral vascular disease ([place])   . 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 0/71/2197   33 mm St Jude bileaflet mechanical valve placed via right mini thoracotomy approach  . 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    1988 - Dr Blase Mess at [facility] in [place], [place]  . Severe mitral regurgitation 10/11/2014  . Testicular cancer [facility])    He was 58 y/o. He had surgical resection and rad tx's.   Marland Kitchen TIA (transient ischemic attack)    age 37  . Tubulovillous adenoma of colon   . Varicose veins   . 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 Venous (peripheral) insufficiency 02/03/2005    Medications:  Infusions:  . sodium chloride Stopped (02/16/19 1638)  . furosemide 120 mg (02/18/19 0810)    Assessment:  58 yo male on chronic Coumadin for afib, mechanical MVR and mechanical AVR.  INR goal 2.5-3.5. -INR= 3.0 (trend down), CBC stable     PTA Coumadin dose 5 mg daily except 2.5 mg on Mondays.  Recently held dose 6/9 (PTA) for supratherapeutic INR of 4.7.   Goal of Therapy:  INR 2.5-3.5 Monitor platelets by anticoagulation protocol: Yes   Plan:  Warfarin 5mg  PO x 1 tonight Daily INR  Hildred Laser, PharmD Clinical Pharmacist **Pharmacist phone directory can now be found on [email or web address] ([email or web address]).  Listed under [place].

## 2019-02-18 NOTE — Progress Notes (Signed)
Patient requested unna boots to be removed- that they were hurting very badly.   Called MD to let them know.    Per MD - ok to take off unna boots   Removed Unna boots- patient requested pain medicine, gabapentin and robaxin to be given together.     [place] to see if we can adjust time to give at same time

## 2019-02-18 NOTE — Progress Notes (Signed)
Brief Nutrition Education Follow-Up Note  RD consulted for education, which was attempted yesterday, however, pt did not answer phone during multiple attempts.   RD re-attempted education in person, however, pt was not in room at time of visit.   Refer to RD note on 02/17/19. "Low Sodium Nutrition Therapy" handout from the Academy of Nutrition and Dietetics attached to pt's discharge instructions.  Body mass index is 38.55 kg/m. Patient meets criteria for obesity, class II based on current BMI.   Current diet order is Heart Healthy with 1.2 L fluids restriction, patient is consuming approximately 100% of meals at this time. Labs and medications reviewed.   No nutrition interventions warranted at this time. If nutrition issues arise, please consult RD.   Amed Datta A. Jimmye Norman, RD, LDN, [place] Registered Dietitian II Certified Diabetes Care and Education Specialist Pager: [phone] After hours Pager: [phone]

## 2019-02-18 NOTE — Progress Notes (Signed)
   Subjective:   Patient states he's grumpy today due to discomfort from his UNNA boots.  He worked with PT this morning so he's tired. Shortness of breath is a little worse after walking. He states he had less urine output yesterday compared to the day before.   Objective:  Vital signs in last 24 hours: Vitals:   02/17/19 2304 02/18/19 0434 02/18/19 0609 02/18/19 0636  BP: 114/63  128/65   Pulse: (!) 57  (!) 145 70  Resp:    20  Temp: 98.6 F (37 C)  98.4 F (36.9 C)   TempSrc: Oral  Oral   SpO2: 100%  100%   Weight:  92.5 kg    Height:        General: Middle aged male, NAD, laying in bed Cardiac: RRR, systolic murmur, 2+ BL LE edema, unable to appreciate JVD Pulmonary: Minimal bibasilar crackles, no wheezing, normal work of breathing Abdomen: Soft, non-tender, non-distended, normoactive bowel sounds MSK: Unna boots in place   Assessment/Plan:  Active Problems:   Acute on chronic heart failure (HCC)   Pressure injury of skin  This is a 58 year old male with a history of half-pack, mechanical AVR status post Bentall procedure 1988, severe mitral regurgitation status postmechanicalvalve in May 2016, CAD status post SVG-RCA, chronic A. fib, ascending aortic dissection, diabetes mellitus, hypertension, OSA on CPAP, PVD, testicular cancer status post medication treatment and surgical resection who presented with worsening lower extremity edema. Noted to have an acute on chronic heart failure.   Acute on chronic HFpEF (EF of 50-55%) Likely due to dietary indiscretion. Patient had output of 6.2 L, intake of 3L, net output 3L. Weight is similar to yesterday at 204 lbs, down from 219 on admission. Cr is 0.95, from 0.97 yesterday. Blood pressures remain stable. On exam he still appears volume overloaded. He is having great output and is reporting symptomatic improvement.   -Cardiology following, appreciate recommendations -Continue metolazone and lasix 120 mg BID -Strict I+O,  need to limit fluid intake to 1200 ml per day -Daily weights -Daily BMP -PT/OT rec HH PT, no further OT follow up -CSW to assist with [facility]    Afib Mechanical AVR and MVR: Remains rate controlled on his atenolol 25 mg BID, continued on home warfarin. INR today was 3.0. Started ASA while here. Holding digoxin while here and will discontinue this on discharge.   -Continue atenolol 25 mg BID -Continue ASA 81 mg daily -Continue home warfarin, per pharmacy -Continue to hold digoxin  Microcytic anemia: Hgb stable, 9.2 today, around baseline. No evidence of bleeding today. On ASA and warfarin now.  -Monitor CBC  Hypokalemia  K today was 3.6,   -Continue K-dur 40 daily and additionally replete as needed  Hypervolemic hyponatremia: Due to volume overload. Na 126 on admission,improved to 130.  Sodium slightly decreased today at 129. -Continue diuresis  DM: A1c 5.2. Will continue to monitor. CBGs ranged around 76-114.  Chronic pain syndrome: -Continue oxycodone, gabapentin and robaxin. Patient can take all together.  FEN: No fluids, replete lytes prn, HH with fluid restriction diet VTE ppx: Lovenox  Code Status: FULL    Dispo: Anticipated discharge is pending clinical improvement.   [name], MD 02/18/2019, 7:25 AM Pager: [phone]

## 2019-02-18 NOTE — Progress Notes (Signed)
Progress Note  Patient Name: Lance Cook Date of Encounter: 02/18/2019  Primary Cardiologist: [name], MD   Subjective   Feels weak today   No CP  Constipated    Painful to have legs wrapped  Inpatient Medications    Scheduled Meds: . aspirin EC  81 mg Oral Daily  . gabapentin  200-400 mg Oral TID  . Gerhardt's butt cream   Topical BID  . insulin aspart  0-15 Units Subcutaneous TID WC  . methocarbamol  500 mg Oral TID  . metolazone  5 mg Oral Daily  . mupirocin ointment  1 application Topical TID  . oxyCODONE  5 mg Oral Q8H  . potassium chloride  40 mEq Oral Daily  . sodium chloride flush  10-40 mL Intracatheter Q12H  . traZODone  100 mg Oral QHS  . Warfarin - Pharmacist Dosing Inpatient   Does not apply q1800   Continuous Infusions: . sodium chloride Stopped (02/16/19 1638)  . furosemide 120 mg (02/18/19 0810)   PRN Meds: sodium chloride, acetaminophen **OR** acetaminophen, barrier cream, promethazine, senna-docusate, sodium chloride flush   Vital Signs    Vitals:   02/18/19 0434 02/18/19 0609 02/18/19 0636 02/18/19 0806  BP:  128/65  116/66  Pulse:  (!) 145 70 65  Resp:   20   Temp:  98.4 F (36.9 C)    TempSrc:  Oral    SpO2:  100%  100%  Weight: 92.5 kg     Height:        Intake/Output Summary (Last 24 hours) at 02/18/2019 0836 Last data filed at 02/18/2019 0810 Gross per 24 hour  Intake 3428.77 ml  Output 5576 ml  Net -2147.23 ml    Net I/O 12. L negative   Filed Weights   02/16/19 0438 02/17/19 0304 02/18/19 0434  Weight: 96 kg 92.9 kg 92.5 kg    Telemetry    Afib   - Personally Reviewed  Physical Exam   Physical exam per MD:  GEN: No acute distress.   Neck: JVP is increased   no carotid bruits Cardiac: Irreg ireeg  Crisp valve sounds  No rubs, or gallops.  Respiratory: Clear to auscultation bilaterally, no wheezes/ rales/ rhonchi GI: NABS, Soft, nontender, non-distended  MS:  Legs wrapped   Pitting edema in thighs; No  deformity. Neuro:  Nonfocal, moving all extremities spontaneously Psych: Normal affect   Labs    Chemistry Recent Labs  Lab 02/17/19 0432 02/17/19 1026 02/17/19 1427 02/18/19 0645  NA 131*  --  128* 129*  K 2.7*  --  3.1* 3.6  CL 86*  --  81* 85*  CO2 35*  --  39* 35*  GLUCOSE 84  --  100* 83  BUN 25*  --  20 21*  CREATININE 0.97  --  0.81 0.95  CALCIUM 7.7*  --  7.9* 7.8*  ALBUMIN  --  1.8*  --   --   GFRNONAA >60  --  >60 >60  GFRAA >60  --  >60 >60  ANIONGAP 10  --  8 9     Hematology Recent Labs  Lab 02/16/19 0709 02/17/19 0432 02/18/19 0645  WBC 8.0 7.4 9.3  RBC 4.52 4.68 4.62  HGB 8.9* 9.3* 9.2*  HCT 31.2* 32.3* 31.6*  MCV 69.0* 69.0* 68.4*  MCH 19.7* 19.9* 19.9*  MCHC 28.5* 28.8* 29.1*  RDW 19.3* 19.1* 19.6*  PLT 179 172 180    Cardiac EnzymesNo results for input(s): TROPONINI in the  last 168 hours. No results for input(s): TROPIPOC in the last 168 hours.   BNP Recent Labs  Lab 02/13/19 1504  BNP 223.8*     DDimer No results for input(s): DDIMER in the last 168 hours.   Radiology    No results found.  Cardiac Studies   ECHO:01/31/2019 1. The left ventricle has normal systolic function, with an ejection fraction of 55-60%. The cavity size was normal. Left ventricular diastolic function could not be evaluated due to mitral valve replacement/repair. There is abnormal septal motion  consistent with left bundle branch block. 2. Mild hypokinesis of the left ventricular, entire septal wall. 3. The right ventricle has normal systolic function. The cavity was normal. There is no increase in right ventricular wall thickness. Right ventricular systolic pressure is severely elevated with an estimated pressure of 62.4 mmHg. 4. Left atrial size was severely dilated. 5. Right atrial size was severely dilated. 6. A 33 [employer]. Jude mechanical valve is present in the mitral position. Procedure Date: 01/24/2015 There is no Echo evidence of rocking of the  mitral prosthesis. Echo findings are consistent with perivalvular leak the mitral prosthesis. 7. Mitral valve regurgitation mild perivalvular. Mitral valve gradient appropriate for prosthesis mitral valve stenosis. 8. The tricuspid valve is grossly normal. Tricuspid valve regurgitation is moderate. 9. A 70mm St. Jude mechanical prosthesis valve is present in the aortic position. Normal aortic valve prosthesis. Echo findings are consistent with Mean gradient ~30 mmHg of the aortic prosthesis. 10. The aortic arch is normal in size and structure. 11. There is dilatation of the ascending aorta measuring 38 mm. 12. Post Bentall 1988. 13. The inferior vena cava was dilated in size with <50% respiratory variability  Patient Profile     58 y.o.malew/ hx mech AVR & Bentall 1988, MVR 2016, perm Afib, D-CHF, OSA on CPAP, venous insufficiency, chronic pain issues, chronic anticoag, testicular CA s/p XRT & surgery, was admitted 06/14 with CHF  Assessment & Plan    1. Acute on chronic diastolic CHF: diuresing on IV lasix and metolazone. UOP net -2.3L in the past 24 hours and -11.9L this admission. Weight unchanged from yesterday at 204lbs, down from 219lbs on admission. Bilateral unna boots placed by ortho 02/17/2019 - Continue aggressive diuresis with IV lasix and metolazone - Continue to monitor daily weight and strict I&Os - Continue to monitor electrolytes closely and replete to maintain K>4, Mg>2 - Continue to encourage low sodium diet  Reviweed with pt    2. Electrolyte imbalance: overall improved. K 3.6 this morning - Continue K 40 mEq BID for now while aggressively diuresing   - Continue to monitor electrolytes closely and replete to maintain K>4, Mg>2  3. Permanent atrial fibrillation: noted to have some tachycardia with HR 145 on vitals at 6am today, improved on repeat. Npt pm  AV nodal blocking agents. Home digoxin discontinued this admission given elevated levels. INR 3.0 - Continue  coumadin per pharmacy  Follow rates   Can add low dose toprol    4. S/p mechanical aortic and mitral vavle replacement: s/p AVR and Bental procedure in 1988 and MVR in 2016. Mean gradient across AV has been high for several years (mean 37 mm Hg)    INR 3.4 today - Continue coumadin per pharmacy.  5. NSVT: short bursts noted on telemetry this admission - Continue aggressive electrolyte repletion as above  For questions or updates, please contact [place] Please consult [email or web address] for contact info under Cardiology/STEMI.  Signed, Abigail Butts, PA-C  02/18/2019, 8:36 AM   [phone]

## 2019-02-18 NOTE — Progress Notes (Signed)
Physical Therapy Treatment Patient Details Name: Lance Cook MRN: [id number] DOB: 02/27/1961 Today's Date: 02/18/2019    History of Present Illness 58 yo admitted with HF exacerbation. PMHx: CHF, AVR, MVR, CAD, AFib, AA dissection, DM, HTN, PVD, testicular CA, CVA at 58yo    PT Comments    Pt sitting in chair on arrival and agreeable to walking and HEP but declined sitting in recliner. Pt limiting gait to walking to the nursing station and back and not agreeable to progressing distance. Pt able to perform HEP EOB but holds his breathe and relies on assist of trunk momentum to move legs. Pt cued for breathing and need to work on strengthening to decrease burden of transfers particularly since he pulls on wife at home.   HR 68  with SpO2 96-100% on RA BP 116/66    Follow Up Recommendations  Home health PT     Equipment Recommendations  None recommended by PT    Recommendations for Other Services       Precautions / Restrictions Precautions Precautions: Fall    Mobility  Bed Mobility Overal bed mobility: Needs Assistance Bed Mobility: Sit to Supine       Sit to supine: Modified independent (Device/Increase time)   General bed mobility comments: in chair on arrival. increased momentum to bring legs to surface for sit to supine with verbal cues for positioning  Transfers Overall transfer level: Modified independent               General transfer comment: pt able to rise from chair and to bed without assist  Ambulation/Gait Ambulation/Gait assistance: Min guard Gait Distance (Feet): 120 Feet Assistive device: Rolling walker (2 wheeled) Gait Pattern/deviations: Step-through pattern;Decreased stride length;Wide base of support   Gait velocity interpretation: >2.62 ft/sec, indicative of community ambulatory General Gait Details: pt with increased sway with gait due to bil LE edema and body habitus   Stairs             Wheelchair Mobility    Modified  Rankin (Stroke Patients Only)       Balance Overall balance assessment: Needs assistance   Sitting balance-Leahy Scale: Good       Standing balance-Leahy Scale: Fair Standing balance comment: pt able to stand without UE assist but requested use of Rw for gait                            Cognition Arousal/Alertness: Awake/alert Behavior During Therapy: WFL for tasks assessed/performed Overall Cognitive Status: Within Functional Limits for tasks assessed                                        Exercises General Exercises - Lower Extremity Long Arc Quad: AROM;Both;10 reps;Seated(requires trunk momentum to complete) Hip Flexion/Marching: AROM;Both;10 reps;Seated    General Comments        Pertinent Vitals/Pain      Home Living                      Prior Function            PT Goals (current goals can now be found in the care plan section) Progress towards PT goals: Progressing toward goals    Frequency    Min 3X/week      PT Plan Current plan remains appropriate    Co-evaluation  AM-PAC PT "6 Clicks" Mobility   Outcome Measure  Help needed turning from your back to your side while in a flat bed without using bedrails?: A Little Help needed moving from lying on your back to sitting on the side of a flat bed without using bedrails?: A Little Help needed moving to and from a bed to a chair (including a wheelchair)?: None Help needed standing up from a chair using your arms (e.g., wheelchair or bedside chair)?: None Help needed to walk in hospital room?: A Little Help needed climbing 3-5 steps with a railing? : A Lot 6 Click Score: 19    End of Session Equipment Utilized During Treatment: Gait belt Activity Tolerance: Patient tolerated treatment well Patient left: in bed;with call bell/phone within reach;with bed alarm set Nurse Communication: Mobility status PT Visit Diagnosis: Muscle weakness  (generalized) (M62.81);Other abnormalities of gait and mobility (R26.89)     Time: 7169-6789 PT Time Calculation (min) (ACUTE ONLY): 27 min  Charges:  $Gait Training: 8-22 mins $Therapeutic Exercise: 8-22 mins                     Honestie Kulik Pam Drown, PT Acute Rehabilitation Services Pager: [phone] Office: [place] 02/18/2019, 1:01 PM

## 2019-02-19 LAB — GLUCOSE, CAPILLARY
Glucose-Capillary: 75 mg/dL (ref 70–99)
Glucose-Capillary: 80 mg/dL (ref 70–99)
Glucose-Capillary: 71 mg/dL (ref 70–99)
Glucose-Capillary: 84 mg/dL (ref 70–99)

## 2019-02-19 LAB — CBC
HCT: 32.1 % — ABNORMAL LOW (ref 39.0–52.0)
Hemoglobin: 9.3 g/dL — ABNORMAL LOW (ref 13.0–17.0)
MCH: 19.9 pg — ABNORMAL LOW (ref 26.0–34.0)
MCHC: 29 g/dL — ABNORMAL LOW (ref 30.0–36.0)
MCV: 68.6 fL — ABNORMAL LOW (ref 80.0–100.0)
Platelets: 176 10*3/uL (ref 150–400)
RBC: 4.68 MIL/uL (ref 4.22–5.81)
RDW: 19.3 % — ABNORMAL HIGH (ref 11.5–15.5)
WBC: 7.9 10*3/uL (ref 4.0–10.5)
nRBC: 0 % (ref 0.0–0.2)

## 2019-02-19 LAB — BASIC METABOLIC PANEL
Anion gap: 8 (ref 5–15)
BUN: 22 mg/dL — ABNORMAL HIGH (ref 6–20)
CO2: 36 mmol/L — ABNORMAL HIGH (ref 22–32)
Calcium: 7.7 mg/dL — ABNORMAL LOW (ref 8.9–10.3)
Chloride: 85 mmol/L — ABNORMAL LOW (ref 98–111)
Creatinine, Ser: 0.84 mg/dL (ref 0.61–1.24)
GFR calc Af Amer: >60 mL/min (ref >60)
GFR calc non Af Amer: >60 mL/min (ref >60)
Glucose, Bld: 76 mg/dL (ref 70–99)
Potassium: 3.2 mmol/L — ABNORMAL LOW (ref 3.5–5.1)
Sodium: 129 mmol/L — ABNORMAL LOW (ref 135–145)

## 2019-02-19 LAB — PROTIME-INR
INR: 2.5 — ABNORMAL HIGH (ref 0.8–1.2)
Prothrombin Time: 26.8 seconds — ABNORMAL HIGH (ref 11.4–15.2)

## 2019-02-19 LAB — MAGNESIUM: Magnesium: 1.6 mg/dL — ABNORMAL LOW (ref 1.7–2.4)

## 2019-02-19 MED ORDER — POTASSIUM CHLORIDE 10 MEQ/100ML IV SOLN
10.0000 meq | INTRAVENOUS | Status: AC
  Administered 2019-02-19 (×6): 10 meq via INTRAVENOUS
  Filled 2019-02-19 (×4): qty 100

## 2019-02-19 MED ORDER — WARFARIN SODIUM 7.5 MG PO TABS
7.5000 mg | ORAL_TABLET | Freq: Once | ORAL | Status: AC
  Administered 2019-02-19: 7.5 mg via ORAL
  Filled 2019-02-19: qty 1

## 2019-02-19 MED ORDER — MAGNESIUM SULFATE 4 GM/100ML IV SOLN
4.0000 g | Freq: Once | INTRAVENOUS | Status: AC
  Administered 2019-02-19: 4 g via INTRAVENOUS
  Filled 2019-02-19: qty 100

## 2019-02-19 MED ORDER — POTASSIUM CHLORIDE 10 MEQ/100ML IV SOLN
10.0000 meq | INTRAVENOUS | Status: AC
  Filled 2019-02-19 (×2): qty 100

## 2019-02-19 NOTE — Progress Notes (Signed)
Subjective:   Lance Cook mentions that he feels that he is breathing better. States that the pain medications grouped together is helping him.  He reports that his swelling is gone down his legs, states that the Unna boots were very painful yesterday and he is requesting not to use them.  We discussed the importance of using these to help with his diuresis and he expressed understanding, he reported that he wants to take them off if they become too painful.  Objective:  Vital signs in last 24 hours: Vitals:   02/18/19 2040 02/18/19 2124 02/19/19 0531 02/19/19 0543  BP: (!) 103/54  (!) 94/48   Pulse: 67 68 (!) 56   Resp: 20 18 18    Temp: 98.4 F (36.9 C)  98.6 F (37 C)   TempSrc: Oral  Oral   SpO2: 93% 95% 100%   Weight:    91.6 kg  Height:        General: Middle-aged male, well-appearing, sitting up in chair Cardiac: Regular rate and rhythm, no murmurs rubs or gallops Pulmonary: Normal work of breathing, on room air, minimal bibasilar crackles noted, no wheezing or rhonchi Abdomen: Soft, nontender, nondistended, normoactive bowel sounds Extremity: 1-2+ bilateral lower extremity edema, venous stasis changes   Assessment/Plan:  Active Problems:   Acute on chronic heart failure (HCC)   Pressure injury of skin  This is a 58 year old male with a history of half-pack, mechanical AVR status post Bentall procedure 1988, severe mitral regurgitation status postmechanicalvalve in May 2016, CAD status post SVG-RCA, chronic A. fib, ascending aortic dissection, diabetes mellitus, hypertension, OSA on CPAP, PVD, testicular cancer status post medication treatment and surgical resection who presented with worsening lower extremity edema. Noted to have an acute on chronic heart failure.  Acute on chronic HFpEF (EF of50-55%) Likely due to dietary indiscretion.  Net output of -1.8 L.  Weight down to 201 lbs, from 204 yesterday and 219 on admission, dry weight around 189lbs.   Creatinine has  remained stable at 0.84, bicarb is 36.  Pressure was a little on the softer side today, down to 94/48.  He remains asymptomatic, denies any chest pain, shortness of breath, lightheadedness, or dizziness.  On exam he still has some fluid overload, however has significant improvement in his lower extremity swelling.  Overall he is still diuresing well and is tolerating the aggressive diuresis.  Possibly able to be discharged in a few days.  -Cardiology following, appreciate recommendations -Continue metolazone daily -Continue Lasix 120 mg twice daily -Tricked I's and O's, limit fluid intake to 1200 mL/day -Daily weights -Daily BMP -Replace UNNA boots, patient agreeable to trying this again -PT/OT, recommends home health PT -Home health orders placed, social worker to assist with home health arrangement  Electrolyte abnormalities: Potassium down to 3.2, mag at 1.6.  Patient has been having widened QRS on telemetry, this has been seen previously on her's EKGs.  Will obtain repeat EKG to evaluate.  We will continue to replete electrolytes -Continue K-Dur 40 mg twice daily, give additional potassium as needed. -Follow-up EKG -Daily BMP and mag  NSVT: -Short bursts on tele, remains asymptomatic. Monitor electrolytes and replete as needed.   Afib Mechanical AVR and MVR: Patient remains rate controlled on high-dose atenolol 25 mg twice daily.  Continued his home warfarin and we started him on aspirin while here. INR today is 2.5, on the lower side, pharmacy increasing warfarin dose. Holding digoxin per cardiology recommendations, will discontinue this on discharge.  -Continue atenolol  25 mg BID -Continue ASA 81 mg daily -Continue home warfarin, per pharmacy -Continue to hold digoxin  Hypervolemic hyponatremia: Due to volume overload. Na 126 on admission,improved to 130.  Sodium slightly decreased today at 129.  -Continue diuresis  Chronic pain syndrome: -Continue oxycodone, gabapentin  and robaxin. Patient can take all together.  FEN: No fluids, replete lytes prn, HH with fluid restriction diet VTE ppx: Lovenox  Code Status: FULL    Dispo: Anticipated discharge in approximately 2-3 day(s).   Lance Noble, MD 02/19/2019, 6:55 AM Pager: [phone]

## 2019-02-19 NOTE — Progress Notes (Signed)
Central monitor called patient HR down in 40 with wide QRS, pt. Was asymptomatic, MD notified, no new order given MD instruct to continue to monitor the patient.

## 2019-02-19 NOTE — Progress Notes (Signed)
Progress Note  Patient Name: Lance Cook Date of Encounter: 02/19/2019  Primary Cardiologist: [name], MD   Subjective   "dyspnea improved."  Inpatient Medications    Scheduled Meds: . aspirin EC  81 mg Oral Daily  . gabapentin  200-400 mg Oral Q8H  . Gerhardt's butt cream   Topical BID  . insulin aspart  0-15 Units Subcutaneous TID WC  . methocarbamol  500 mg Oral Q8H  . metolazone  5 mg Oral Daily  . metoprolol succinate  25 mg Oral BID WC  . mupirocin ointment  1 application Topical TID  . oxyCODONE  5 mg Oral Q8H  . potassium chloride  40 mEq Oral BID  . sodium chloride flush  10-40 mL Intracatheter Q12H  . traZODone  100 mg Oral QHS  . warfarin  7.5 mg Oral ONCE-1800  . Warfarin - Pharmacist Dosing Inpatient   Does not apply q1800   Continuous Infusions: . sodium chloride Stopped (02/16/19 1638)  . furosemide 120 mg (02/19/19 0941)  . magnesium sulfate bolus IVPB 4 g (02/19/19 1214)  . potassium chloride     PRN Meds: sodium chloride, acetaminophen **OR** acetaminophen, barrier cream, promethazine, senna-docusate, sodium chloride flush   Vital Signs    Vitals:   02/18/19 2124 02/19/19 0531 02/19/19 0543 02/19/19 0934  BP:  (!) 94/48  110/68  Pulse: 68 (!) 56  64  Resp: 18 18    Temp:  98.6 F (37 C)  98.4 F (36.9 C)  TempSrc:  Oral  Oral  SpO2: 95% 100%  93%  Weight:   91.6 kg   Height:        Intake/Output Summary (Last 24 hours) at 02/19/2019 1216 Last data filed at 02/19/2019 1106 Gross per 24 hour  Intake 1314 ml  Output 3950 ml  Net -2636 ml   Filed Weights   02/17/19 0304 02/18/19 0434 02/19/19 0543  Weight: 92.9 kg 92.5 kg 91.6 kg    Telemetry    Atrial fib - Personally Reviewed  ECG    none - Personally Reviewed  Physical Exam   GEN: No acute distress.   Neck: No JVD Cardiac: RRR, no murmurs, rubs, or gallops.  Respiratory: Clear to auscultation bilaterally. GI: Soft, nontender, non-distended  MS: No edema; No  deformity. Neuro:  Nonfocal  Psych: Normal affect   Labs    Chemistry Recent Labs  Lab 02/17/19 1026 02/17/19 1427 02/18/19 0645 02/19/19 0525  NA  --  128* 129* 129*  K  --  3.1* 3.6 3.2*  CL  --  81* 85* 85*  CO2  --  39* 35* 36*  GLUCOSE  --  100* 83 76  BUN  --  20 21* 22*  CREATININE  --  0.81 0.95 0.84  CALCIUM  --  7.9* 7.8* 7.7*  ALBUMIN 1.8*  --   --   --   GFRNONAA  --  >60 >60 >60  GFRAA  --  >60 >60 >60  ANIONGAP  --  8 9 8      Hematology Recent Labs  Lab 02/17/19 0432 02/18/19 0645 02/19/19 0525  WBC 7.4 9.3 7.9  RBC 4.68 4.62 4.68  HGB 9.3* 9.2* 9.3*  HCT 32.3* 31.6* 32.1*  MCV 69.0* 68.4* 68.6*  MCH 19.9* 19.9* 19.9*  MCHC 28.8* 29.1* 29.0*  RDW 19.1* 19.6* 19.3*  PLT 172 180 176    Cardiac EnzymesNo results for input(s): TROPONINI in the last 168 hours. No results for input(s): TROPIPOC  in the last 168 hours.   BNP Recent Labs  Lab 02/13/19 1504  BNP 223.8*     DDimer No results for input(s): DDIMER in the last 168 hours.   Radiology    No results found.  Cardiac Studies   none  Patient Profile     58 y.o. male admitted with acute on chronic diastolic heart failure admitted with volume overload.  Assessment & Plan    1. Acute on chronic diastolic heart failure - Patient tells me his ideal weight is 189 lbs. He was 201.9 today. We will continue iv diuresis.  2. Atrial fib - his rates are controlled.  3. NSVT - stable.      For questions or updates, please contact [place] Please consult [email or web address] for contact info under Cardiology/STEMI.      Signed, Lance Peru, MD  02/19/2019, 12:16 PM  Patient ID: Lance Cook, male   DOB: 18-Oct-1960, 58 y.o.   MRN: [id number]

## 2019-02-19 NOTE — Progress Notes (Signed)
[place] for warfarin Indication: atrial fibrillation, mech AVR, mech MVR  Allergies  Allergen Reactions  . Penicillins Hives    Has patient had a PCN reaction causing immediate rash, facial/tongue/throat swelling, SOB or lightheadedness with hypotension: Yes Has patient had a PCN reaction causing severe rash involving mucus membranes or skin necrosis: Yes Has patient had a PCN reaction that required hospitalization unsure Has patient had a PCN reaction occurring within the last 10 years: 2010-2012 If all of the above answers are "NO", then may proceed with Cephalosporin use.  . Adhesive [Tape] Other (See Comments)    Skin irritation  . Bactrim [Sulfamethoxazole-Trimethoprim] Other (See Comments)    CDIFF, when taking oral tablets    Patient Measurements: Height: 5\' 1"  (154.9 cm) Weight: 201 lb 14.4 oz (91.6 kg) IBW/kg (Calculated) : 52.3 Heparin Dosing Weight: 75.6 kg  Vital Signs: Temp: 98.6 F (37 C) (06/20 0531) Temp Source: Oral (06/20 0531) BP: 94/48 (06/20 0531) Pulse Rate: 56 (06/20 0531)  Labs: Recent Labs    02/17/19 0432 02/17/19 1427 02/18/19 0645 02/19/19 0525  HGB 9.3*  --  9.2* 9.3*  HCT 32.3*  --  31.6* 32.1*  PLT 172  --  180 176  LABPROT 34.0*  --  30.5* 26.8*  INR 3.4*  --  3.0* 2.5*  CREATININE 0.97 0.81 0.95 0.84    Estimated Creatinine Clearance: 93.3 mL/min (by C-G formula based on SCr of 0.84 mg/dL).   Medical History: Past Medical History:  Diagnosis Date  . Acute on chronic diastolic CHF (congestive heart failure) ([place]) 08/27/2016  . Anemia   . Anxiety   . ARDS (adult respiratory distress syndrome) ([place]) 01/27/2015  . Arthritis   . Ascending aortic dissection ([place]) 07/14/2008   Localized dissection of ascending aorta noted on CTA in 2009 and stable on CTA in 2011  . Asymptomatic chronic venous hypertension 01/15/2010   Overview:  Overview:  Qualifier: Diagnosis of  By: [name] MD, [name]    Last Assessment & Plan:  I suggested he go to a vein clinic to see if he could be qualified compression hose of some type, since he is not a surgical candidate according to the vascular surgeons.   . Atrial fibrillation ([place])    chronic persistent  . Bell's palsy   . C. difficile diarrhea   . CAD (coronary artery disease)    Old scar inferior wall myoview, 10/2009 EF 52%.  He did have previous SVG to RCA but no obstructive disease noted on his most recent cath.  SVG occluded.   . Cellulitis 04/02/2014  . Cerebral artery occlusion with cerebral infarction ([place]) 10/08/2011   Overview:  Overview:  And hx of TIA prior to CABG, all thought due to systemic emboli prior to coumadin   . Chronic diastolic heart failure ([place]) 03/01/2015   a. 12/2015: echo showing a preserved EF of 65-70%, moderate AS, and moderate TR.   Marland Kitchen Chronic LBP 10/08/2011  . CVA (cerebral vascular accident) ([place]) 10/08/2011   And hx of TIA prior to CABG, all thought due to systemic emboli prior to coumadin   . DIABETES MELLITUS, TYPE II 11/01/2009   Qualifier: Diagnosis of  By: [name] MD, [name]    . Diverticulosis   . 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 Encephalopathy acute 02/05/2015  . Gastric polyps   .  GERD (gastroesophageal reflux disease)    not needing medication at thhis time- 01/22/15  . Gout   . H/O mechanical aortic valve replacement 11/01/2009   Qualifier: Diagnosis of  By: [name], [name]    . Hiatal hernia   . History of colon polyps 12/23/2011   Overview:  Overview:  Colonoscopy January 2010, 6 mm rectal tubulovillous adenoma. No high-grade dysplasia   . HTN (hypertension) 02/23/2014  . Hyperlipidemia   . Hypertension   . Hypogonadism male 04/08/2012  . Impaired glucose tolerance 10/08/2011  . Morbid obesity ([place]) 04/02/2014  . Myocardial infarction [facility]) age 73  . OSA (obstructive  sleep apnea)    CPAP  . Peripheral vascular disease ([place])   . 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 02/18/5092   33 mm St Jude bileaflet mechanical valve placed via right mini thoracotomy approach  . 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    1988 - Dr Blase Mess at [facility] in [place], [place]  . Severe mitral regurgitation 10/11/2014  . Testicular cancer [facility])    He was 58 y/o. He had surgical resection and rad tx's.   Marland Kitchen TIA (transient ischemic attack)    age 48  . Tubulovillous adenoma of colon   . Varicose veins   . 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 Venous (peripheral) insufficiency 02/03/2005    Medications:  Infusions:  . sodium chloride Stopped (02/16/19 1638)  . furosemide 120 mg (02/18/19 1828)  . potassium chloride      Assessment:  58 yo male on chronic Coumadin for afib, mechanical MVR and mechanical AVR.  INR goal 2.5-3.5.  INR continues to trend down and at lower end of therapeutic range at 2.5 this AM. Hgb stable at 9.3, plts ok, no reports of bleeding.     PTA Coumadin dose 5 mg daily except 2.5 mg on Mondays.  Recently held dose 6/9 (PTA) for supratherapeutic INR of 4.7.  Goal of Therapy:  INR 2.5-3.5 Monitor platelets by anticoagulation protocol: Yes   Plan:  Warfarin 7.5mg  PO x1 today Daily INR, CBC, s/sx bleeding   Juanell Fairly, PharmD PGY1 Pharmacy Resident 02/19/2019 8:29 AM **Pharmacist phone directory can now be found on [email or web address] ([email or web address]).  Listed under [place].

## 2019-02-19 NOTE — Progress Notes (Signed)
Notified MD on call about the widened QRS. New orders from MD noted. Pt denies any new discomfort or distress. Will continue monitoring.

## 2019-02-20 DIAGNOSIS — R601 Generalized edema: Secondary | ICD-10-CM

## 2019-02-20 DIAGNOSIS — I509 Heart failure, unspecified: Secondary | ICD-10-CM

## 2019-02-20 DIAGNOSIS — E878 Other disorders of electrolyte and fluid balance, not elsewhere classified: Secondary | ICD-10-CM

## 2019-02-20 DIAGNOSIS — I471 Supraventricular tachycardia: Secondary | ICD-10-CM

## 2019-02-20 LAB — BASIC METABOLIC PANEL
Anion gap: 11 (ref 5–15)
BUN: 23 mg/dL — ABNORMAL HIGH (ref 6–20)
CO2: 35 mmol/L — ABNORMAL HIGH (ref 22–32)
Calcium: 7.7 mg/dL — ABNORMAL LOW (ref 8.9–10.3)
Chloride: 84 mmol/L — ABNORMAL LOW (ref 98–111)
Creatinine, Ser: 0.79 mg/dL (ref 0.61–1.24)
GFR calc Af Amer: >60 mL/min (ref >60)
GFR calc non Af Amer: >60 mL/min (ref >60)
Glucose, Bld: 84 mg/dL (ref 70–99)
Potassium: 3 mmol/L — ABNORMAL LOW (ref 3.5–5.1)
Sodium: 130 mmol/L — ABNORMAL LOW (ref 135–145)

## 2019-02-20 LAB — GLUCOSE, CAPILLARY
Glucose-Capillary: 58 mg/dL — ABNORMAL LOW (ref 70–99)
Glucose-Capillary: 89 mg/dL (ref 70–99)
Glucose-Capillary: 107 mg/dL — ABNORMAL HIGH (ref 70–99)
Glucose-Capillary: 99 mg/dL (ref 70–99)
Glucose-Capillary: 98 mg/dL (ref 70–99)
Glucose-Capillary: 106 mg/dL — ABNORMAL HIGH (ref 70–99)
Glucose-Capillary: 102 mg/dL — ABNORMAL HIGH (ref 70–99)

## 2019-02-20 LAB — PROTIME-INR
INR: 3.1 — ABNORMAL HIGH (ref 0.8–1.2)
Prothrombin Time: 31.7 seconds — ABNORMAL HIGH (ref 11.4–15.2)

## 2019-02-20 LAB — CBC
HCT: 31.4 % — ABNORMAL LOW (ref 39.0–52.0)
Hemoglobin: 9.2 g/dL — ABNORMAL LOW (ref 13.0–17.0)
MCH: 20.1 pg — ABNORMAL LOW (ref 26.0–34.0)
MCHC: 29.3 g/dL — ABNORMAL LOW (ref 30.0–36.0)
MCV: 68.7 fL — ABNORMAL LOW (ref 80.0–100.0)
Platelets: 188 10*3/uL (ref 150–400)
RBC: 4.57 MIL/uL (ref 4.22–5.81)
RDW: 19.5 % — ABNORMAL HIGH (ref 11.5–15.5)
WBC: 8.6 10*3/uL (ref 4.0–10.5)
nRBC: 0 % (ref 0.0–0.2)

## 2019-02-20 LAB — MAGNESIUM: Magnesium: 1.9 mg/dL (ref 1.7–2.4)

## 2019-02-20 MED ORDER — POTASSIUM CHLORIDE CRYS ER 20 MEQ PO TBCR
40.0000 meq | EXTENDED_RELEASE_TABLET | Freq: Once | ORAL | Status: AC
  Administered 2019-02-21: 40 meq via ORAL
  Filled 2019-02-20: qty 2

## 2019-02-20 MED ORDER — TORSEMIDE 20 MG PO TABS
120.0000 mg | ORAL_TABLET | Freq: Every day | ORAL | Status: DC
  Administered 2019-02-20 – 2019-02-21 (×2): 120 mg via ORAL
  Filled 2019-02-20 (×3): qty 1

## 2019-02-20 MED ORDER — WARFARIN SODIUM 2.5 MG PO TABS
2.5000 mg | ORAL_TABLET | Freq: Once | ORAL | Status: AC
  Administered 2019-02-20: 2.5 mg via ORAL
  Filled 2019-02-20: qty 1

## 2019-02-20 MED ORDER — POTASSIUM CHLORIDE 10 MEQ/100ML IV SOLN
10.0000 meq | INTRAVENOUS | Status: DC
  Administered 2019-02-20 (×2): 10 meq via INTRAVENOUS
  Filled 2019-02-20: qty 100

## 2019-02-20 MED ORDER — POTASSIUM CHLORIDE CRYS ER 20 MEQ PO TBCR
40.0000 meq | EXTENDED_RELEASE_TABLET | Freq: Two times a day (BID) | ORAL | Status: DC
  Administered 2019-02-20 – 2019-02-22 (×5): 40 meq via ORAL
  Filled 2019-02-20 (×5): qty 2

## 2019-02-20 MED ORDER — POTASSIUM CHLORIDE 10 MEQ/100ML IV SOLN
10.0000 meq | INTRAVENOUS | Status: DC
  Filled 2019-02-20: qty 100

## 2019-02-20 MED ORDER — FUROSEMIDE 80 MG PO TABS: 240.0000 mg | ORAL_TABLET | Freq: Once | ORAL | Status: DC

## 2019-02-20 NOTE — Progress Notes (Signed)
Multiple attempts made to reinsert IV unsuccessful. Pt. With IV meds due. On call for IMTS paged to make aware.

## 2019-02-20 NOTE — Progress Notes (Signed)
[place] for warfarin Indication: atrial fibrillation, mech AVR, mech MVR  Allergies  Allergen Reactions  . Penicillins Hives    Has patient had a PCN reaction causing immediate rash, facial/tongue/throat swelling, SOB or lightheadedness with hypotension: Yes Has patient had a PCN reaction causing severe rash involving mucus membranes or skin necrosis: Yes Has patient had a PCN reaction that required hospitalization unsure Has patient had a PCN reaction occurring within the last 10 years: 2010-2012 If all of the above answers are "NO", then may proceed with Cephalosporin use.  . Adhesive [Tape] Other (See Comments)    Skin irritation  . Bactrim [Sulfamethoxazole-Trimethoprim] Other (See Comments)    CDIFF, when taking oral tablets    Patient Measurements: Height: 5\' 1"  (154.9 cm) Weight: 206 lb 9.1 oz (93.7 kg)(scale b) IBW/kg (Calculated) : 52.3 Heparin Dosing Weight: 75.6 kg  Vital Signs: Temp: 97.9 F (36.6 C) (06/21 0413) Temp Source: Oral (06/21 0413) BP: 101/50 (06/21 0413) Pulse Rate: 53 (06/21 0413)  Labs: Recent Labs    02/18/19 0645 02/19/19 0525 02/20/19 0724  HGB 9.2* 9.3*  --   HCT 31.6* 32.1*  --   PLT 180 176  --   LABPROT 30.5* 26.8* 31.7*  INR 3.0* 2.5* 3.1*  CREATININE 0.95 0.84 0.79    Estimated Creatinine Clearance: 99.3 mL/min (by C-G formula based on SCr of 0.79 mg/dL).   Medical History: Past Medical History:  Diagnosis Date  . Acute on chronic diastolic CHF (congestive heart failure) ([place]) 08/27/2016  . Anemia   . Anxiety   . ARDS (adult respiratory distress syndrome) ([place]) 01/27/2015  . Arthritis   . Ascending aortic dissection ([place]) 07/14/2008   Localized dissection of ascending aorta noted on CTA in 2009 and stable on CTA in 2011  . Asymptomatic chronic venous hypertension 01/15/2010   Overview:  Overview:  Qualifier: Diagnosis of  By: [name] MD, [name]   Last Assessment & Plan:  I  suggested he go to a vein clinic to see if he could be qualified compression hose of some type, since he is not a surgical candidate according to the vascular surgeons.   . Atrial fibrillation ([place])    chronic persistent  . Bell's palsy   . C. difficile diarrhea   . CAD (coronary artery disease)    Old scar inferior wall myoview, 10/2009 EF 52%.  He did have previous SVG to RCA but no obstructive disease noted on his most recent cath.  SVG occluded.   . Cellulitis 04/02/2014  . Cerebral artery occlusion with cerebral infarction ([place]) 10/08/2011   Overview:  Overview:  And hx of TIA prior to CABG, all thought due to systemic emboli prior to coumadin   . Chronic diastolic heart failure ([place]) 03/01/2015   a. 12/2015: echo showing a preserved EF of 65-70%, moderate AS, and moderate TR.   Marland Kitchen Chronic LBP 10/08/2011  . CVA (cerebral vascular accident) ([place]) 10/08/2011   And hx of TIA prior to CABG, all thought due to systemic emboli prior to coumadin   . DIABETES MELLITUS, TYPE II 11/01/2009   Qualifier: Diagnosis of  By: [name] MD, [name]    . Diverticulosis   . 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 Encephalopathy acute 02/05/2015  . Gastric polyps   . GERD (gastroesophageal reflux disease)    not needing medication  at thhis time- 01/22/15  . Gout   . H/O mechanical aortic valve replacement 11/01/2009   Qualifier: Diagnosis of  By: [name], [name]    . Hiatal hernia   . History of colon polyps 12/23/2011   Overview:  Overview:  Colonoscopy January 2010, 6 mm rectal tubulovillous adenoma. No high-grade dysplasia   . HTN (hypertension) 02/23/2014  . Hyperlipidemia   . Hypertension   . Hypogonadism male 04/08/2012  . Impaired glucose tolerance 10/08/2011  . Morbid obesity ([place]) 04/02/2014  . Myocardial infarction [facility]) age 38  . OSA (obstructive sleep apnea)    CPAP  .  Peripheral vascular disease ([place])   . 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 7/82/4235   33 mm St Jude bileaflet mechanical valve placed via right mini thoracotomy approach  . 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 Severe mitral regurgitation 10/11/2014  . Testicular cancer [facility])    He was 58 y/o. He had surgical resection and rad tx's.   Marland Kitchen TIA (transient ischemic attack)    age 20  . Tubulovillous adenoma of colon   . Varicose veins   . 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 Venous (peripheral) insufficiency 02/03/2005    Medications:  Infusions:  . sodium chloride Stopped (02/16/19 1638)  . furosemide 120 mg (02/19/19 2241)  . potassium chloride      Assessment:  58 yo male on chronic Coumadin for afib, mechanical MVR and mechanical AVR.  INR goal 2.5-3.5.  INR therapeutic at 3.1 this AM. Hgb stable at 9.2, plts ok, no reports of bleeding.     PTA Coumadin dose 5 mg daily except 2.5 mg on Mondays.  Recently held dose 6/9 (PTA) for supratherapeutic INR of 4.7.  Goal of Therapy:  INR 2.5-3.5 Monitor platelets by anticoagulation protocol: Yes   Plan:  Warfarin 2.5mg  PO x1 today Daily INR, CBC, s/sx bleeding   Juanell Fairly, PharmD PGY1 Pharmacy Resident 02/20/2019 9:12 AM **Pharmacist phone directory can now be found on [email or web address] ([email or web address]).  Listed under [place].

## 2019-02-20 NOTE — Progress Notes (Signed)
   Subjective: Patient reports that his breathing has been doing better, denies any new complaints today.  He has been having good urinary output and feels that the edema has been improving.  Objective:  Vital signs in last 24 hours: Vitals:   02/19/19 0934 02/19/19 1926 02/19/19 1935 02/20/19 0413  BP: 110/68 (!) 126/55  (!) 101/50  Pulse: 64 (!) 57  (!) 53  Resp:  20 17 20   Temp: 98.4 F (36.9 C) 97.7 F (36.5 C)  97.9 F (36.6 C)  TempSrc: Oral Oral  Oral  SpO2: 93% 100% 100% 100%  Weight:    93.7 kg  Height:        General: Middle-aged male, no acute distress, lying in bed Cardiac: irregularly irregular rhythm, systolic murmur with click Pulmonary: Minimal bibasilar crackles, no wheezing, normal work of breathing, on room air Abdomen: Soft, nontender, nondistended, normoactive  MSK: 1-2+ bilateral lower extremity edema    Assessment/Plan:  Active Problems:   Acute on chronic heart failure (HCC)   Pressure injury of skin  This is a 58 year old male with a history of half-pack, mechanical AVR status post Bentall procedure 1988, severe mitral regurgitation status postmechanicalvalve in May 2016, CAD status post SVG-RCA, chronic A. fib, ascending aortic dissection, diabetes mellitus, hypertension, OSA on CPAP, PVD, testicular cancer status post medication treatment and surgical resection who presented with worsening lower extremity edema. Noted to have an acute on chronic heart failure.  Acute on chronic HFpEF (EF of50-55%) Likely due to dietary discretion, net output of 2.4 L yesterday, weight is up to 206 pounds from 201 today, however this may not be accurate due to his diuresis and continued symptomatic improvements.  Creatinine has been stable at 0.79, bicarb of 35.  Vitals have been stable, has been having some bradycardia, down to 40, he remains asymptomatic and other vitals are stable.  Given his electrolyte abnormalities will repeat EKG to follow-up.  Continues to  diuresis well.   -Cardiology following, appreciate recommendations -Continue metolazone daily -Continue Lasix 120 mg twice daily -Strict I's and O's, limit fluid intake to 1200 mL/day -Daily weights -Daily BMP -PT/OT, recommends home health PT -Home health orders placed, social worker to assist with home health arrangement  Electrolyte abnormalities: K 3.0, mag improved to 1.9 today. Some possible ST changes on tele, will repeat EKG.   -Replete with K-dur and IV K -Daily BMP   NSVT: -Short bursts on tele, remains asymptomatic. Continue to monitor electrolytes and replete as needed.   Afib Mechanical AVR and MVR: Still rate controlled, on metoprolol 25 mg BID. Continued warfarin, added ASA while here.  -Continue metoprolol 25 mg BID -Continue ASA 81 mg daily -Continue home warfarin, per pharmacy -Continue to hold digoxin  Hypervolemic hyponatremia: Due to volume overload. Na 126 on admission,improved to 130.Na 130 today.  -Continue diuresis  Chronic pain syndrome: -Continue oxycodone, gabapentin and robaxin. Patient can take all together.  FEN: No fluids, replete lytes prn, HH with fluid restriction diet VTE ppx: Lovenox  Code Status: FULL   Dispo: Anticipated discharge in approximately 2-3 day(s).   [name], MD 02/20/2019, 6:31 AM Pager: [phone]

## 2019-02-20 NOTE — Progress Notes (Signed)
Progress Note  Patient Name: Lance Cook Date of Encounter: 02/20/2019  Primary Cardiologist: [name], MD   Subjective   Breathing better. Diuresed 2.5L negative overnight- now 16L negative, but remains well above dry weight. Creatinine stable at 0.79.  Inpatient Medications    Scheduled Meds: . aspirin EC  81 mg Oral Daily  . gabapentin  200-400 mg Oral Q8H  . Gerhardt's butt cream   Topical BID  . insulin aspart  0-15 Units Subcutaneous TID WC  . methocarbamol  500 mg Oral Q8H  . metolazone  5 mg Oral Daily  . metoprolol succinate  25 mg Oral BID WC  . mupirocin ointment  1 application Topical TID  . oxyCODONE  5 mg Oral Q8H  . potassium chloride  40 mEq Oral BID  . sodium chloride flush  10-40 mL Intracatheter Q12H  . traZODone  100 mg Oral QHS  . warfarin  2.5 mg Oral ONCE-1800  . Warfarin - Pharmacist Dosing Inpatient   Does not apply q1800   Continuous Infusions: . sodium chloride Stopped (02/16/19 1638)  . furosemide 120 mg (02/19/19 2241)  . potassium chloride     PRN Meds: sodium chloride, acetaminophen **OR** acetaminophen, barrier cream, promethazine, senna-docusate, sodium chloride flush   Vital Signs    Vitals:   02/19/19 1926 02/19/19 1935 02/20/19 0413 02/20/19 1029  BP: (!) 126/55  (!) 101/50 (!) 106/57  Pulse: (!) 57  (!) 53 (!) 56  Resp: 20 17 20    Temp: 97.7 F (36.5 C)  97.9 F (36.6 C)   TempSrc: Oral  Oral   SpO2: 100% 100% 100%   Weight:   93.7 kg   Height:        Intake/Output Summary (Last 24 hours) at 02/20/2019 1105 Last data filed at 02/20/2019 0418 Gross per 24 hour  Intake 720 ml  Output 3450 ml  Net -2730 ml   Filed Weights   02/18/19 0434 02/19/19 0543 02/20/19 0413  Weight: 92.5 kg 91.6 kg 93.7 kg    Telemetry    Atrial fib with PVC's- Personally Reviewed  ECG    N/A - Personally Reviewed  Physical Exam   General appearance: alert, fatigued and moderately obese Lungs: clear to auscultation bilaterally  Heart: irregularly irregular rhythm Extremities: edema 2+ edema Neurologic: Mental status: Alert, oriented, thought content appropriate   Labs    Chemistry Recent Labs  Lab 02/17/19 1026  02/18/19 0645 02/19/19 0525 02/20/19 0724  NA  --    < > 129* 129* 130*  K  --    < > 3.6 3.2* 3.0*  CL  --    < > 85* 85* 84*  CO2  --    < > 35* 36* 35*  GLUCOSE  --    < > 83 76 84  BUN  --    < > 21* 22* 23*  CREATININE  --    < > 0.95 0.84 0.79  CALCIUM  --    < > 7.8* 7.7* 7.7*  ALBUMIN 1.8*  --   --   --   --   GFRNONAA  --    < > >60 >60 >60  GFRAA  --    < > >60 >60 >60  ANIONGAP  --    < > 9 8 11    < > = values in this interval not displayed.     Hematology Recent Labs  Lab 02/18/19 0645 02/19/19 0525 02/20/19 0724  WBC 9.3 7.9  8.6  RBC 4.62 4.68 4.57  HGB 9.2* 9.3* 9.2*  HCT 31.6* 32.1* 31.4*  MCV 68.4* 68.6* 68.7*  MCH 19.9* 19.9* 20.1*  MCHC 29.1* 29.0* 29.3*  RDW 19.6* 19.3* 19.5*  PLT 180 176 188    Cardiac EnzymesNo results for input(s): TROPONINI in the last 168 hours. No results for input(s): TROPIPOC in the last 168 hours.   BNP Recent Labs  Lab 02/13/19 1504  BNP 223.8*     DDimer No results for input(s): DDIMER in the last 168 hours.   Radiology    No results found.  Cardiac Studies   none  Patient Profile     58 y.o. male admitted with acute on chronic diastolic heart failure admitted with volume overload.  Assessment & Plan    1. Acute on chronic diastolic heart failure - weight today may be inaccurate, since it is up, but he was 2L negative. Continue diuresis. 2. Atrial fib - rate-controlled.  3. NSVT - stable.  4. Electrolyte abnormalities - hyponatremia should improve with diuresis. Continue aggressive potassium repletion.    For questions or updates, please contact [place] Please consult [email or web address] for contact info under Cardiology/STEMI.   Pixie Casino, MD, [facility], [place]  Director of the Advanced Lipid Disorders &  Cardiovascular Risk Reduction Clinic Diplomate of the American Board of Clinical Lipidology Attending Cardiologist  Direct Dial: [phone]  Fax: [phone]  Website:  [email or web address]  Pixie Casino, MD  02/20/2019, 11:05 AM

## 2019-02-20 NOTE — Plan of Care (Signed)
  Problem: Education: Goal: Ability to verbalize understanding of medication therapies will improve Outcome: Progressing   Problem: Elimination: Goal: Will not experience complications related to bowel motility Outcome: Progressing

## 2019-02-21 ENCOUNTER — Inpatient Hospital Stay (HOSPITAL_COMMUNITY): Payer: Medicare HMO

## 2019-02-21 LAB — BASIC METABOLIC PANEL
Anion gap: 10 (ref 5–15)
BUN: 25 mg/dL — ABNORMAL HIGH (ref 6–20)
CO2: 38 mmol/L — ABNORMAL HIGH (ref 22–32)
Calcium: 8 mg/dL — ABNORMAL LOW (ref 8.9–10.3)
Chloride: 86 mmol/L — ABNORMAL LOW (ref 98–111)
Creatinine, Ser: 1.06 mg/dL (ref 0.61–1.24)
GFR calc Af Amer: >60 mL/min (ref >60)
GFR calc non Af Amer: >60 mL/min (ref >60)
Glucose, Bld: 84 mg/dL (ref 70–99)
Potassium: 3.3 mmol/L — ABNORMAL LOW (ref 3.5–5.1)
Sodium: 134 mmol/L — ABNORMAL LOW (ref 135–145)

## 2019-02-21 LAB — GLUCOSE, CAPILLARY
Glucose-Capillary: 68 mg/dL — ABNORMAL LOW (ref 70–99)
Glucose-Capillary: 120 mg/dL — ABNORMAL HIGH (ref 70–99)
Glucose-Capillary: 65 mg/dL — ABNORMAL LOW (ref 70–99)
Glucose-Capillary: 88 mg/dL (ref 70–99)

## 2019-02-21 LAB — CBC
HCT: 32.1 % — ABNORMAL LOW (ref 39.0–52.0)
Hemoglobin: 9.1 g/dL — ABNORMAL LOW (ref 13.0–17.0)
MCH: 19.9 pg — ABNORMAL LOW (ref 26.0–34.0)
MCHC: 28.3 g/dL — ABNORMAL LOW (ref 30.0–36.0)
MCV: 70.1 fL — ABNORMAL LOW (ref 80.0–100.0)
Platelets: 209 10*3/uL (ref 150–400)
RBC: 4.58 MIL/uL (ref 4.22–5.81)
RDW: 19.7 % — ABNORMAL HIGH (ref 11.5–15.5)
WBC: 7.7 10*3/uL (ref 4.0–10.5)
nRBC: 0 % (ref 0.0–0.2)

## 2019-02-21 LAB — PROTIME-INR
INR: 3.4 — ABNORMAL HIGH (ref 0.8–1.2)
Prothrombin Time: 33.5 seconds — ABNORMAL HIGH (ref 11.4–15.2)

## 2019-02-21 MED ORDER — WARFARIN SODIUM 2.5 MG PO TABS
2.5000 mg | ORAL_TABLET | Freq: Once | ORAL | Status: AC
  Administered 2019-02-21: 2.5 mg via ORAL
  Filled 2019-02-21: qty 1

## 2019-02-21 MED ORDER — LIDOCAINE HCL (PF) 1 % IJ SOLN
INTRAMUSCULAR | Status: AC | PRN
  Administered 2019-02-21: 10 mL

## 2019-02-21 MED ORDER — LIDOCAINE HCL 1 % IJ SOLN
INTRAMUSCULAR | Status: AC
  Filled 2019-02-21: qty 20

## 2019-02-21 MED ORDER — FUROSEMIDE 10 MG/ML IJ SOLN
120.0000 mg | Freq: Two times a day (BID) | INTRAVENOUS | Status: DC
  Administered 2019-02-21 – 2019-02-25 (×7): 120 mg via INTRAVENOUS
  Filled 2019-02-21 (×2): qty 12
  Filled 2019-02-21: qty 10
  Filled 2019-02-21 (×2): qty 12
  Filled 2019-02-21: qty 10
  Filled 2019-02-21 (×2): qty 12
  Filled 2019-02-21 (×2): qty 10
  Filled 2019-02-21: qty 12

## 2019-02-21 NOTE — Plan of Care (Signed)
  Problem: Activity: Goal: Capacity to carry out activities will improve Outcome: Progressing   Problem: Activity: Goal: Risk for activity intolerance will decrease Outcome: Progressing   Problem: Elimination: Goal: Will not experience complications related to bowel motility Outcome: Progressing   Problem: Safety: Goal: Ability to remain free from injury will improve Outcome: Progressing   Problem: Activity: Goal: Capacity to carry out activities will improve Outcome: Progressing

## 2019-02-21 NOTE — Progress Notes (Signed)
   Subjective:   Patient mentioned that the swelling in his thighs have been going down. He states "food is something to be desired" but everything your eat is tough and therefore not eating as much as he usually does. He denies any chest pain. Reports that his breathing is getting a lot better.   Objective:  Vital signs in last 24 hours: Vitals:   02/20/19 1722 02/20/19 2049 02/20/19 2133 02/21/19 0329  BP:   103/64 111/62  Pulse: (!) 56 60 (!) 57 60  Resp:  18 18 18   Temp:   98.5 F (36.9 C) 98.2 F (36.8 C)  TempSrc:   Oral Oral  SpO2:  100% 100% 100%  Weight:    90.4 kg  Height:        General: Middle aged male, NAD, sitting up in bed Cardiac: Irregularly irregular, systolic murmur with click Pulmonary: Minimal bibasilar crackles, normal work of breathing, no wheezing Abdomen: Soft, non-tender, non-distended, normoactive bowel sounds Extremity: 1-2+ BL LE edema, more in thigh area    Assessment/Plan:  Active Problems:   Acute on chronic heart failure (HCC)   Pressure injury of skin   Acute on chronic congestive heart failure (HCC)   Anasarca  This is a 58 year old male with a history of half-pack, mechanical AVR status post Bentall procedure 1988, severe mitral regurgitation status postmechanicalvalve in May 2016, CAD status post SVG-RCA, chronic A. fib, ascending aortic dissection, diabetes mellitus, hypertension, OSA on CPAP, PVD, testicular cancer status post medication treatment and surgical resection who presented with worsening lower extremity edema. Noted to have an acute on chronic heart failure.  Acute on chronic HFpEF (EF of50-55%) Likely due to dietary indiscretion. Total out put 4.2 L, net output 2.6 L. Weight down to 199 lbs today, down from 206 but yesterdays weight was likely inaccurate. Patient continues to report symptomatic improvement. On exam he has minimal bibasilar crackles, still has some BL LE edema but has overall improved. Cr today is 1.06,  yesterday it was 0.75. Continue to diuresis, getting closer to dry weight and may be stable for discharge soon.  Night patient lost access, he was given a dose of torsemide.  However given his need for continued diuresis will place midline to continue IV Lasix.  -Cardiology following, appreciate recommendations -Continue metolazone daily -Continue Lasix on 20 mg twice daily IV -Strict I's and O's, limit fluid intake to 1200 mL/day -Daily weights -Daily BMP -PT/OT, recommends home health PT -Home health orders placed, social worker to assist with home healtharrangement   Electrolyte abnormalities: K was 3.0 yesterday, repleted, this morning it's 3.3.   -Replete with K-dur -Daily BMP   NSVT: -Short bursts on tele, remains asymptomatic. Continue to monitor electrolytes and replete as needed.  Afib Mechanical AVR and MVR: Still rate controlled, on metoprolol 25 mg BID. Continued warfarin, added ASA while here.  -Continue metoprolol 25 mg BID -Continue ASA 81 mg daily -Continue home warfarin, per pharmacy -Continue to hold digoxin  Hypervolemic hyponatremia: Due to volume overload. Na 126 on admission,improved to 130.Na 134 today.  -Continue diuresis  Chronic pain syndrome: -Continue oxycodone, gabapentin and robaxin. Patient can take all together.  FEN: No fluids, replete lytes prn, HH diet  VTE ppx: On warfarin Code Status: FULL    Dispo: Anticipated discharge in approximately 1-2 day(s).   [name], MD 02/21/2019, 6:31 AM Pager: [phone]

## 2019-02-21 NOTE — Progress Notes (Signed)
Orthopedic Tech Progress Note Patient Details:  Lance Cook 08-24-1961 [id number]  Ortho Devices Type of Ortho Device: Haematologist Ortho Device/Splint Location: Bilateral unna boots Patient refused Ortho Device/Splint Interventions: Application   Post Interventions Patient Tolerated: Well Instructions Provided: Care of device   [name] 02/21/2019, 2:00 PM

## 2019-02-21 NOTE — Progress Notes (Signed)
Progress Note  Patient Name: Lance Cook Date of Encounter: 02/21/2019  Primary Cardiologist:   [name], MD   Subjective   No chest pain.  Weak.  No acute SOB.  Chronic SOB.   Inpatient Medications    Scheduled Meds: . aspirin EC  81 mg Oral Daily  . gabapentin  200-400 mg Oral Q8H  . Gerhardt's butt cream   Topical BID  . insulin aspart  0-15 Units Subcutaneous TID WC  . methocarbamol  500 mg Oral Q8H  . metolazone  5 mg Oral Daily  . metoprolol succinate  25 mg Oral BID WC  . mupirocin ointment  1 application Topical TID  . oxyCODONE  5 mg Oral Q8H  . potassium chloride  40 mEq Oral BID  . sodium chloride flush  10-40 mL Intracatheter Q12H  . torsemide  120 mg Oral Daily  . traZODone  100 mg Oral QHS  . Warfarin - Pharmacist Dosing Inpatient   Does not apply q1800   Continuous Infusions: . sodium chloride Stopped (02/16/19 1638)   PRN Meds: sodium chloride, acetaminophen **OR** acetaminophen, barrier cream, promethazine, senna-docusate, sodium chloride flush   Vital Signs    Vitals:   02/20/19 1722 02/20/19 2049 02/20/19 2133 02/21/19 0329  BP:   103/64 111/62  Pulse: (!) 56 60 (!) 57 60  Resp:  18 18 18   Temp:   98.5 F (36.9 C) 98.2 F (36.8 C)  TempSrc:   Oral Oral  SpO2:  100% 100% 100%  Weight:    90.4 kg  Height:        Intake/Output Summary (Last 24 hours) at 02/21/2019 0803 Last data filed at 02/21/2019 0600 Gross per 24 hour  Intake 1304 ml  Output 4200 ml  Net -2896 ml   Filed Weights   02/19/19 0543 02/20/19 0413 02/21/19 0329  Weight: 91.6 kg 93.7 kg 90.4 kg    Telemetry    Atrial fib - Personally Reviewed  ECG    NA - Personally Reviewed  Physical Exam   GEN: No acute distress.   Neck: No  JVD Cardiac: Irregular RR, mechanical S1 and S2, no murmurs, rubs, or gallops.  Respiratory: Clear  to auscultation bilaterally. GI: Soft, nontender, non-distended  MS:   Severe leg edema; No deformity. Neuro:  Nonfocal  Psych:  Normal affect   Labs    Chemistry Recent Labs  Lab 02/17/19 1026  02/18/19 0645 02/19/19 0525 02/20/19 0724  NA  --    < > 129* 129* 130*  K  --    < > 3.6 3.2* 3.0*  CL  --    < > 85* 85* 84*  CO2  --    < > 35* 36* 35*  GLUCOSE  --    < > 83 76 84  BUN  --    < > 21* 22* 23*  CREATININE  --    < > 0.95 0.84 0.79  CALCIUM  --    < > 7.8* 7.7* 7.7*  ALBUMIN 1.8*  --   --   --   --   GFRNONAA  --    < > >60 >60 >60  GFRAA  --    < > >60 >60 >60  ANIONGAP  --    < > 9 8 11    < > = values in this interval not displayed.     Hematology Recent Labs  Lab 02/18/19 0645 02/19/19 0525 02/20/19 0724  WBC 9.3 7.9 8.6  RBC 4.62 4.68 4.57  HGB 9.2* 9.3* 9.2*  HCT 31.6* 32.1* 31.4*  MCV 68.4* 68.6* 68.7*  MCH 19.9* 19.9* 20.1*  MCHC 29.1* 29.0* 29.3*  RDW 19.6* 19.3* 19.5*  PLT 180 176 188    Cardiac EnzymesNo results for input(s): TROPONINI in the last 168 hours. No results for input(s): TROPIPOC in the last 168 hours.   BNPNo results for input(s): BNP, PROBNP in the last 168 hours.   DDimer No results for input(s): DDIMER in the last 168 hours.   Radiology    No results found.  Cardiac Studies   Echo:    1. The left ventricle has normal systolic function, with an ejection fraction of 55-60%. The cavity size was normal. Left ventricular diastolic function could not be evaluated due to mitral valve replacement/repair. There is abnormal septal motion  consistent with left bundle branch block.  2. Mild hypokinesis of the left ventricular, entire septal wall.  3. The right ventricle has normal systolic function. The cavity was normal. There is no increase in right ventricular wall thickness. Right ventricular systolic pressure is severely elevated with an estimated pressure of 62.4 mmHg.  4. Left atrial size was severely dilated.  5. Right atrial size was severely dilated.  6. A 33 St. Jude mechanical valve is present in the mitral position. Procedure Date: 01/24/2015  There is no Echo evidence of rocking of the mitral prosthesis. Echo findings are consistent with perivalvular leak the mitral prosthesis.  7. Mitral valve regurgitation mild perivalvular. Mitral valve gradient appropriate for prosthesis mitral valve stenosis.  8. The tricuspid valve is grossly normal. Tricuspid valve regurgitation is moderate.  9. A 4mm St. Jude mechanical prosthesis valve is present in the aortic position. Normal aortic valve prosthesis. Echo findings are consistent with Mean gradient ~30 mmHg of the aortic prosthesis. 10. The aortic arch is normal in size and structure. 11. There is dilatation of the ascending aorta measuring 38 mm. 12. Post Bentall 1988. 13. The inferior vena cava was dilated in size with <50% respiratory variability.  Patient Profile     58 y.o. male with acute on chronic diastolic heart failure admitted with volume overload.  Assessment & Plan    ACUTE ON CHRONIC DIASTOLIC HF:  Negative 19 liters since admission.  Negative 2.6 liters since yesterday.  Weight is down 19 lbs since admission.  Needs to have much more in hospital diuresis before discharge.  He does well if we can get him to his previous dry weight or below.    MECHANICAL AORTIC VALVE:   Normal valve function on echo.   MECHANICAL MITRAL VALVE:    Essentially normal function in the mitral position.  Mild perivalvular leak     PERMANENT ATRIAL FIB:  On ASA and therapeutic warfarin.     For questions or updates, please contact [place] Please consult [email or web address] for contact info under Cardiology/STEMI.   Signed, [name], MD  02/21/2019, 8:03 AM

## 2019-02-21 NOTE — Progress Notes (Signed)
Patient refused midline placement. Patient stated that every PIV and ML put in him is not working and has blown his veins. Patient wants to have a PICC line placed in IR. RN made aware.

## 2019-02-21 NOTE — Care Management Important Message (Signed)
Important Message  Patient Details  Name: Bralen Wiltgen MRN: [id number] Date of Birth: 1961/08/04   Medicare Important Message Given:  Yes     [name] 02/21/2019, 12:50 PM

## 2019-02-21 NOTE — Progress Notes (Signed)
[place] for warfarin Indication: atrial fibrillation, mech AVR, mech MVR  Allergies  Allergen Reactions  . Penicillins Hives    Has patient had a PCN reaction causing immediate rash, facial/tongue/throat swelling, SOB or lightheadedness with hypotension: Yes Has patient had a PCN reaction causing severe rash involving mucus membranes or skin necrosis: Yes Has patient had a PCN reaction that required hospitalization unsure Has patient had a PCN reaction occurring within the last 10 years: 2010-2012 If all of the above answers are "NO", then may proceed with Cephalosporin use.  . Adhesive [Tape] Other (See Comments)    Skin irritation  . Bactrim [Sulfamethoxazole-Trimethoprim] Other (See Comments)    CDIFF, when taking oral tablets    Patient Measurements: Height: 5\' 1"  (154.9 cm) Weight: 199 lb 4.8 oz (90.4 kg)(scale b) IBW/kg (Calculated) : 52.3 Heparin Dosing Weight: 75.6 kg  Vital Signs: Temp: 98.3 F (36.8 C) (06/22 0928) Temp Source: Oral (06/22 0928) BP: 97/55 (06/22 0928) Pulse Rate: 68 (06/22 0928)  Labs: Recent Labs    02/19/19 0525 02/20/19 0724 02/21/19 0709  HGB 9.3* 9.2* 9.1*  HCT 32.1* 31.4* 32.1*  PLT 176 188 209  LABPROT 26.8* 31.7* 33.5*  INR 2.5* 3.1* 3.4*  CREATININE 0.84 0.79 1.06    Estimated Creatinine Clearance: 73.4 mL/min (by C-G formula based on SCr of 1.06 mg/dL).   Medical History: Past Medical History:  Diagnosis Date  . Acute on chronic diastolic CHF (congestive heart failure) ([place]) 08/27/2016  . Anemia   . Anxiety   . ARDS (adult respiratory distress syndrome) ([place]) 01/27/2015  . Arthritis   . Ascending aortic dissection ([place]) 07/14/2008   Localized dissection of ascending aorta noted on CTA in 2009 and stable on CTA in 2011  . Asymptomatic chronic venous hypertension 01/15/2010   Overview:  Overview:  Qualifier: Diagnosis of  By: [name] MD, [name]   Last Assessment & Plan:  I suggested  he go to a vein clinic to see if he could be qualified compression hose of some type, since he is not a surgical candidate according to the vascular surgeons.   . Atrial fibrillation ([place])    chronic persistent  . Bell's palsy   . C. difficile diarrhea   . CAD (coronary artery disease)    Old scar inferior wall myoview, 10/2009 EF 52%.  He did have previous SVG to RCA but no obstructive disease noted on his most recent cath.  SVG occluded.   . Cellulitis 04/02/2014  . Cerebral artery occlusion with cerebral infarction ([place]) 10/08/2011   Overview:  Overview:  And hx of TIA prior to CABG, all thought due to systemic emboli prior to coumadin   . Chronic diastolic heart failure ([place]) 03/01/2015   a. 12/2015: echo showing a preserved EF of 65-70%, moderate AS, and moderate TR.   Marland Kitchen Chronic LBP 10/08/2011  . CVA (cerebral vascular accident) ([place]) 10/08/2011   And hx of TIA prior to CABG, all thought due to systemic emboli prior to coumadin   . DIABETES MELLITUS, TYPE II 11/01/2009   Qualifier: Diagnosis of  By: [name] MD, [name]    . Diverticulosis   . 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 Encephalopathy acute 02/05/2015  . Gastric polyps   . GERD (gastroesophageal reflux disease)    not needing medication at thhis time- 01/22/15  .  Gout   . H/O mechanical aortic valve replacement 11/01/2009   Qualifier: Diagnosis of  By: [name], [name]    . Hiatal hernia   . History of colon polyps 12/23/2011   Overview:  Overview:  Colonoscopy January 2010, 6 mm rectal tubulovillous adenoma. No high-grade dysplasia   . HTN (hypertension) 02/23/2014  . Hyperlipidemia   . Hypertension   . Hypogonadism male 04/08/2012  . Impaired glucose tolerance 10/08/2011  . Morbid obesity ([place]) 04/02/2014  . Myocardial infarction [facility]) age 48  . OSA (obstructive sleep apnea)    CPAP  . Peripheral  vascular disease ([place])   . 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 5/46/5035   33 mm St Jude bileaflet mechanical valve placed via right mini thoracotomy approach  . 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 Severe mitral regurgitation 10/11/2014  . Testicular cancer [facility])    He was 58 y/o. He had surgical resection and rad tx's.   Marland Kitchen TIA (transient ischemic attack)    age 37  . Tubulovillous adenoma of colon   . Varicose veins   . 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 Venous (peripheral) insufficiency 02/03/2005    Medications:  Infusions:  . sodium chloride Stopped (02/16/19 1638)    Assessment:  58 yo male on chronic Coumadin for afib, mechanical MVR and mechanical AVR.  INR goal 2.5-3.5.  INR therapeutic at 3.4 this AM     PTA Coumadin dose 5 mg daily except 2.5 mg on Mondays.  Recently held dose 6/9 (PTA) for supratherapeutic INR of 4.7.  Goal of Therapy:  INR 2.5-3.5 Monitor platelets by anticoagulation protocol: Yes   Plan:  Repeat Warfarin 2.5mg  PO x1 today Daily INR, CBC, s/sx bleeding  Thank you Anette Guarneri, PharmD [phone] 02/21/2019 9:40 AM **Pharmacist phone directory can now be found on [email or web address] ([email or web address]).  Listed under [place].

## 2019-02-21 NOTE — Plan of Care (Signed)
°  Problem: Education: °Goal: Ability to demonstrate management of disease process will improve °Outcome: Progressing °Goal: Ability to verbalize understanding of medication therapies will improve °Outcome: Progressing °Goal: Individualized Educational Video(s) °Outcome: Progressing °  °

## 2019-02-22 LAB — CBC
HCT: 32 % — ABNORMAL LOW (ref 39.0–52.0)
Hemoglobin: 9.2 g/dL — ABNORMAL LOW (ref 13.0–17.0)
MCH: 20 pg — ABNORMAL LOW (ref 26.0–34.0)
MCHC: 28.8 g/dL — ABNORMAL LOW (ref 30.0–36.0)
MCV: 69.7 fL — ABNORMAL LOW (ref 80.0–100.0)
Platelets: 220 10*3/uL (ref 150–400)
RBC: 4.59 MIL/uL (ref 4.22–5.81)
RDW: 19.9 % — ABNORMAL HIGH (ref 11.5–15.5)
WBC: 8.8 10*3/uL (ref 4.0–10.5)
nRBC: 0 % (ref 0.0–0.2)

## 2019-02-22 LAB — BASIC METABOLIC PANEL
Anion gap: 11 (ref 5–15)
BUN: 34 mg/dL — ABNORMAL HIGH (ref 6–20)
CO2: 36 mmol/L — ABNORMAL HIGH (ref 22–32)
Calcium: 7.7 mg/dL — ABNORMAL LOW (ref 8.9–10.3)
Chloride: 84 mmol/L — ABNORMAL LOW (ref 98–111)
Creatinine, Ser: 0.98 mg/dL (ref 0.61–1.24)
GFR calc Af Amer: >60 mL/min (ref >60)
GFR calc non Af Amer: >60 mL/min (ref >60)
Glucose, Bld: 101 mg/dL — ABNORMAL HIGH (ref 70–99)
Potassium: 3 mmol/L — ABNORMAL LOW (ref 3.5–5.1)
Sodium: 131 mmol/L — ABNORMAL LOW (ref 135–145)

## 2019-02-22 LAB — GLUCOSE, CAPILLARY
Glucose-Capillary: 56 mg/dL — ABNORMAL LOW (ref 70–99)
Glucose-Capillary: 71 mg/dL (ref 70–99)
Glucose-Capillary: 71 mg/dL (ref 70–99)
Glucose-Capillary: 73 mg/dL (ref 70–99)
Glucose-Capillary: 69 mg/dL — ABNORMAL LOW (ref 70–99)
Glucose-Capillary: 118 mg/dL — ABNORMAL HIGH (ref 70–99)

## 2019-02-22 LAB — PROTIME-INR
INR: 2.6 — ABNORMAL HIGH (ref 0.8–1.2)
Prothrombin Time: 27.2 seconds — ABNORMAL HIGH (ref 11.4–15.2)

## 2019-02-22 LAB — MAGNESIUM: Magnesium: 1.5 mg/dL — ABNORMAL LOW (ref 1.7–2.4)

## 2019-02-22 MED ORDER — SODIUM CHLORIDE 0.9 % IV SOLN
510.0000 mg | Freq: Once | INTRAVENOUS | Status: AC
  Administered 2019-02-22: 510 mg via INTRAVENOUS
  Filled 2019-02-22: qty 17

## 2019-02-22 MED ORDER — MAGNESIUM SULFATE 4 GM/100ML IV SOLN
4.0000 g | Freq: Once | INTRAVENOUS | Status: AC
  Administered 2019-02-22: 4 g via INTRAVENOUS
  Filled 2019-02-22: qty 100

## 2019-02-22 MED ORDER — WARFARIN SODIUM 5 MG PO TABS
5.0000 mg | ORAL_TABLET | Freq: Once | ORAL | Status: AC
  Administered 2019-02-22: 5 mg via ORAL
  Filled 2019-02-22: qty 1

## 2019-02-22 MED ORDER — SODIUM CHLORIDE 0.9 % IV SOLN: 510.0000 mg | Freq: Once | INTRAVENOUS | Status: DC

## 2019-02-22 MED ORDER — METOPROLOL SUCCINATE ER 25 MG PO TB24
25.0000 mg | ORAL_TABLET | Freq: Two times a day (BID) | ORAL | Status: DC
  Administered 2019-02-22 – 2019-02-25 (×5): 25 mg via ORAL
  Filled 2019-02-22 (×6): qty 1

## 2019-02-22 MED ORDER — POTASSIUM CHLORIDE 10 MEQ/100ML IV SOLN
10.0000 meq | INTRAVENOUS | Status: AC
  Administered 2019-02-22 (×6): 10 meq via INTRAVENOUS
  Filled 2019-02-22 (×7): qty 100

## 2019-02-22 MED ORDER — SODIUM CHLORIDE 0.9% FLUSH: 10.0000 mL | INTRAVENOUS | Status: DC | PRN

## 2019-02-22 NOTE — Progress Notes (Signed)
Pt. With 14 beat run of V Tach. On call for IMTS paged to make aware. VSS. No distress or discomfort noted. Pt. Sitting up in chair. RN will continue to monitor. Sheehan Stacey, Katherine Roan

## 2019-02-22 NOTE — Progress Notes (Signed)
Progress Note  Patient Name: Lance Cook Date of Encounter: 02/22/2019  Primary Cardiologist:   [name], MD   Subjective   Feels weak.  No acute complaints of SOB.   Inpatient Medications    Scheduled Meds:  aspirin EC  81 mg Oral Daily   gabapentin  200-400 mg Oral Q8H   Gerhardt's butt cream   Topical BID   insulin aspart  0-15 Units Subcutaneous TID WC   methocarbamol  500 mg Oral Q8H   metolazone  5 mg Oral Daily   metoprolol succinate  25 mg Oral BID WC   mupirocin ointment  1 application Topical TID   oxyCODONE  5 mg Oral Q8H   potassium chloride  40 mEq Oral BID   sodium chloride flush  10-40 mL Intracatheter Q12H   traZODone  100 mg Oral QHS   Warfarin - Pharmacist Dosing Inpatient   Does not apply q1800   Continuous Infusions:  sodium chloride Stopped (02/16/19 1638)   furosemide 62 mL/hr at 02/21/19 1700   PRN Meds: sodium chloride, acetaminophen **OR** acetaminophen, barrier cream, promethazine, senna-docusate, sodium chloride flush, sodium chloride flush   Vital Signs    Vitals:   02/21/19 1932 02/21/19 2016 02/22/19 0336 02/22/19 0344  BP: (!) 97/59   120/67  Pulse: (!) 57 60  62  Resp: 18 16  18   Temp: (!) 97.2 F (36.2 C)   97.8 F (36.6 C)  TempSrc: Oral     SpO2: 100% 100%  100%  Weight:   90.6 kg   Height:        Intake/Output Summary (Last 24 hours) at 02/22/2019 0708 Last data filed at 02/22/2019 0600 Gross per 24 hour  Intake 1030.41 ml  Output 3350 ml  Net -2319.59 ml   Filed Weights   02/20/19 0413 02/21/19 0329 02/22/19 0336  Weight: 93.7 kg 90.4 kg 90.6 kg    Telemetry    Atrial fib, run of NSVT - Personally Reviewed  ECG    NA - Personally Reviewed  Physical Exam   GEN: No  acute distress.   Neck: No  JVD Cardiac: Irregular RR, mechanical S1 and S2, 2/6 systolic murmurs, rubs, or gallops.  Respiratory: Clear  to auscultation bilaterally. GI: Soft, nontender, non-distended, normal bowel sounds    MS:  Severe edema lower extremities.  Neuro:   Nonfocal  Psych: Oriented and appropriate   Labs    Chemistry Recent Labs  Lab 02/17/19 1026  02/19/19 0525 02/20/19 0724 02/21/19 0709  NA  --    < > 129* 130* 134*  K  --    < > 3.2* 3.0* 3.3*  CL  --    < > 85* 84* 86*  CO2  --    < > 36* 35* 38*  GLUCOSE  --    < > 76 84 84  BUN  --    < > 22* 23* 25*  CREATININE  --    < > 0.84 0.79 1.06  CALCIUM  --    < > 7.7* 7.7* 8.0*  ALBUMIN 1.8*  --   --   --   --   GFRNONAA  --    < > >60 >60 >60  GFRAA  --    < > >60 >60 >60  ANIONGAP  --    < > 8 11 10    < > = values in this interval not displayed.     Hematology Recent Labs  Lab 02/20/19  7035 02/21/19 0709 02/22/19 0552  WBC 8.6 7.7 8.8  RBC 4.57 4.58 4.59  HGB 9.2* 9.1* 9.2*  HCT 31.4* 32.1* 32.0*  MCV 68.7* 70.1* 69.7*  MCH 20.1* 19.9* 20.0*  MCHC 29.3* 28.3* 28.8*  RDW 19.5* 19.7* 19.9*  PLT 188 209 220    Cardiac EnzymesNo results for input(s): TROPONINI in the last 168 hours. No results for input(s): TROPIPOC in the last 168 hours.   BNPNo results for input(s): BNP, PROBNP in the last 168 hours.   DDimer No results for input(s): DDIMER in the last 168 hours.   Radiology    Ir Picc Placement Right >5 Yrs Inc Img Guide  Result Date: 02/21/2019 CLINICAL DATA:  Heart failure, needs durable venous access EXAM: PICC PLACEMENT WITH ULTRASOUND AND FLUOROSCOPY FLUOROSCOPY TIME:  0.1 minute; 19 uGym2 DAP TECHNIQUE: After written informed consent was obtained, patient was placed in the supine position on angiographic table. Patency of the right basilic vein was confirmed with ultrasound with image documentation. An appropriate skin site was determined. Skin site was marked. Region was prepped using maximum barrier technique including cap and mask, sterile gown, sterile gloves, large sterile sheet, and Chlorhexidine as cutaneous antisepsis. The region was infiltrated locally with 1% lidocaine. Under real-time ultrasound  guidance, the right basilic vein was accessed with a 21 gauge micropuncture needle; the needle tip within the vein was confirmed with ultrasound image documentation. Needle exchanged over a 018 guidewire for a peel-away sheath, through which a 5-French double-lumen power injectable PICC trimmed to 37cm was advanced, positioned with its tip near the cavoatrial junction. Spot chest radiograph confirms appropriate catheter position. Catheter was flushed per protocol and secured externally. The patient tolerated procedure well. COMPLICATIONS: COMPLICATIONS none IMPRESSION: 1. Technically successful five [place] double lumen power injectable PICC placement Electronically Signed   By: Lucrezia Europe M.D.   On: 02/21/2019 16:40    Cardiac Studies   Echo:    1. The left ventricle has normal systolic function, with an ejection fraction of 55-60%. The cavity size was normal. Left ventricular diastolic function could not be evaluated due to mitral valve replacement/repair. There is abnormal septal motion  consistent with left bundle branch block.  2. Mild hypokinesis of the left ventricular, entire septal wall.  3. The right ventricle has normal systolic function. The cavity was normal. There is no increase in right ventricular wall thickness. Right ventricular systolic pressure is severely elevated with an estimated pressure of 62.4 mmHg.  4. Left atrial size was severely dilated.  5. Right atrial size was severely dilated.  6. A 33 St. Jude mechanical valve is present in the mitral position. Procedure Date: 01/24/2015 There is no Echo evidence of rocking of the mitral prosthesis. Echo findings are consistent with perivalvular leak the mitral prosthesis.  7. Mitral valve regurgitation mild perivalvular. Mitral valve gradient appropriate for prosthesis mitral valve stenosis.  8. The tricuspid valve is grossly normal. Tricuspid valve regurgitation is moderate.  9. A 18mm St. Jude mechanical prosthesis valve is present  in the aortic position. Normal aortic valve prosthesis. Echo findings are consistent with Mean gradient ~30 mmHg of the aortic prosthesis. 10. The aortic arch is normal in size and structure. 11. There is dilatation of the ascending aorta measuring 38 mm. 12. Post Bentall 1988. 13. The inferior vena cava was dilated in size with <50% respiratory variability.  Patient Profile     58 y.o. male with acute on chronic diastolic heart failure admitted with volume overload.  Assessment & Plan    ACUTE ON CHRONIC DIASTOLIC HF:  Negative 31.5 liters since admission.  Negative 2.3 liters since yesterday.  Weight is down 19 lbs since admission.  I think he needs continued diuresis.  Not yet at dry weight and he does not do well with PO diuresis in this situation at home.  Might need home PT when he is discharged as he is much weaker than I have seen him in the past.   MECHANICAL AORTIC VALVE:   Normal valve function on echo.  Continue current therapy.     MECHANICAL MITRAL VALVE:    Essentially normal function in the mitral position.  Mild perivalvular leak .  Continue current therapy.    PERMANENT ATRIAL FIB:  On ASA and therapeutic warfarin.   [place] per pharmacy.       NSVT:    Potassium pending.  Magnesium was low normal two days ago.    I am going to try to increase his beta blocker slightly but his BP will not allow much in the way of med titration.  Repeat magnesium.     For questions or updates, please contact [place] Please consult [email or web address] for contact info under Cardiology/STEMI.   Signed, [name], MD  02/22/2019, 7:08 AM

## 2019-02-22 NOTE — Progress Notes (Signed)
Inpatient Diabetes Program Recommendations  AACE/ADA: New Consensus Statement on Inpatient Glycemic Control (2015)  Target Ranges:  Prepandial:   less than 140 mg/dL      Peak postprandial:   less than 180 mg/dL (1-2 hours)      Critically ill patients:  140 - 180 mg/dL   Lab Results  Component Value Date   GLUCAP 73 02/22/2019   HGBA1C 5.2 02/14/2019    Review of Glycemic Control Results for Lance Cook, Lance Cook (MRN [id number]) as of 02/22/2019 14:12  Ref. Range 02/21/2019 21:11 02/22/2019 06:22 02/22/2019 06:54 02/22/2019 09:59 02/22/2019 11:20  Glucose-Capillary Latest Ref Range: 70 - 99 mg/dL 88 56 (L) 71 71 73   Inpatient Diabetes Program Recommendations:   -D/C current Novolog correction scale for now. Continue CBGs-continue to run low.  Thank you, Nani Gasser. Tristin Gladman, RN, MSN, CDE  Diabetes Coordinator Inpatient Glycemic Control Team Team Pager [phone] (8am-5pm) 02/22/2019 2:13 PM

## 2019-02-22 NOTE — Progress Notes (Signed)
[name] for warfarin Indication: atrial fibrillation, mech AVR, mech MVR  Allergies  Allergen Reactions  . Penicillins Hives    Has patient had a PCN reaction causing immediate rash, facial/tongue/throat swelling, SOB or lightheadedness with hypotension: Yes Has patient had a PCN reaction causing severe rash involving mucus membranes or skin necrosis: Yes Has patient had a PCN reaction that required hospitalization unsure Has patient had a PCN reaction occurring within the last 10 years: 2010-2012 If all of the above answers are "NO", then may proceed with Cephalosporin use.  . Adhesive [Tape] Other (See Comments)    Skin irritation  . Bactrim [Sulfamethoxazole-Trimethoprim] Other (See Comments)    CDIFF, when taking oral tablets    Patient Measurements: Height: 5\' 1"  (154.9 cm) Weight: 199 lb 12.8 oz (90.6 kg)(scale b) IBW/kg (Calculated) : 52.3 Heparin Dosing Weight: 75.6 kg  Vital Signs: Temp: 97.5 F (36.4 C) (06/23 1119) Temp Source: Oral (06/23 1119) BP: 116/66 (06/23 1119) Pulse Rate: 59 (06/23 1119)  Labs: Recent Labs    02/20/19 0724 02/21/19 0709 02/22/19 0552 02/22/19 0752  HGB 9.2* 9.1* 9.2*  --   HCT 31.4* 32.1* 32.0*  --   PLT 188 209 220  --   LABPROT 31.7* 33.5* 27.2*  --   INR 3.1* 3.4* 2.6*  --   CREATININE 0.79 1.06  --  0.98    Estimated Creatinine Clearance: 79.5 mL/min (by C-G formula based on SCr of 0.98 mg/dL).   Assessment:  58 yo male on chronic Coumadin for afib, mechanical MVR and mechanical AVR.  INR goal 2.5-3.5.  INR therapeutic at 2.6 this AM  (down from 3.4)    PTA Coumadin dose 5 mg daily except 2.5 mg on Mondays.  Recently held dose 6/9 (PTA) for supratherapeutic INR of 4.7.  Goal of Therapy:  INR 2.5-3.5 Monitor platelets by anticoagulation protocol: Yes   Plan:  Warfarin 5 mg po x 1 tonight Daily INR, CBC, s/sx bleeding  Thank you Anette Guarneri, PharmD [phone] 02/22/2019  11:22 AM **Pharmacist phone directory can now be found on [email or web address] ([email or web address]).  Listed under [place].

## 2019-02-22 NOTE — Plan of Care (Signed)
  Problem: Clinical Measurements: Goal: Will remain free from infection Outcome: Progressing   Problem: Elimination: Goal: Will not experience complications related to bowel motility Outcome: Progressing   

## 2019-02-22 NOTE — Progress Notes (Signed)
Physical Therapy Treatment Patient Details Name: Lance Cook MRN: [id number] DOB: Feb 17, 1961 Today's Date: 02/22/2019    History of Present Illness 58 yo admitted with HF exacerbation. PMHx: CHF, AVR, MVR, CAD, AFib, AA dissection, DM, HTN, PVD, testicular CA, CVA at 58yo    PT Comments    Pt pleasant, supine on arrival and willing to walk. Pt on 2L O2 on arrival with SpO2 100% and able to remove to RA with SpO2 97%. Pt continues to request supplemental oxygen despite education yesterday and again today for supplemental oxygen with desaturation not all the time. Remained on RA throughout session with SpO2 94-97%, Hr 56-63. Pt with improved bed mobility with continued use of momentum and increased tolerance for HEP but also continues to require rest during repetitions and cues to not hold his breath with trunk momentum to elevate legs. Will continue to follow and recommend daily ambulation with nursing.     Follow Up Recommendations  Home health PT     Equipment Recommendations  None recommended by PT    Recommendations for Other Services       Precautions / Restrictions Precautions Precautions: Fall    Mobility  Bed Mobility Overal bed mobility: Needs Assistance Bed Mobility: Supine to Sit;Sit to Supine     Supine to sit: Supervision Sit to supine: Supervision   General bed mobility comments: pt able to transition from supine to sit with assist for lines and momentum to bring legs back on surface with return to supine  Transfers Overall transfer level: Modified independent               General transfer comment: pt able to rise from bed without assist with use of RW for standing  Ambulation/Gait Ambulation/Gait assistance: Min guard Gait Distance (Feet): 120 Feet Assistive device: Rolling walker (2 wheeled) Gait Pattern/deviations: Step-through pattern;Decreased stride length;Wide base of support   Gait velocity interpretation: >2.62 ft/sec, indicative of  community ambulatory General Gait Details: pt with increased sway with gait due to bil LE edema and body habitus, cues for proximity to RW. Pt setting gait distance and unable to progress beyond distance of last few sessions   Stairs             Wheelchair Mobility    Modified Rankin (Stroke Patients Only)       Balance Overall balance assessment: Needs assistance   Sitting balance-Leahy Scale: Good       Standing balance-Leahy Scale: Fair Standing balance comment: pt able to stand without UE assist but requested use of Rw for gait                            Cognition Arousal/Alertness: Awake/alert Behavior During Therapy: WFL for tasks assessed/performed Overall Cognitive Status: Within Functional Limits for tasks assessed                                        Exercises General Exercises - Lower Extremity Long Arc Quad: AROM;Both;Seated;20 reps Hip Flexion/Marching: AROM;Both;Seated;20 reps    General Comments        Pertinent Vitals/Pain Pain Assessment: No/denies pain    Home Living                      Prior Function            PT Goals (current  goals can now be found in the care plan section) Progress towards PT goals: Progressing toward goals    Frequency           PT Plan Current plan remains appropriate    Co-evaluation              AM-PAC PT "6 Clicks" Mobility   Outcome Measure  Help needed turning from your back to your side while in a flat bed without using bedrails?: A Little Help needed moving from lying on your back to sitting on the side of a flat bed without using bedrails?: A Little Help needed moving to and from a bed to a chair (including a wheelchair)?: None Help needed standing up from a chair using your arms (e.g., wheelchair or bedside chair)?: None Help needed to walk in hospital room?: A Little Help needed climbing 3-5 steps with a railing? : A Lot 6 Click Score: 19     End of Session   Activity Tolerance: Patient tolerated treatment well Patient left: in bed;with call bell/phone within reach;with bed alarm set Nurse Communication: Mobility status PT Visit Diagnosis: Muscle weakness (generalized) (M62.81);Other abnormalities of gait and mobility (R26.89)     Time: 1173-5670 PT Time Calculation (min) (ACUTE ONLY): 31 min  Charges:  $Gait Training: 8-22 mins $Therapeutic Exercise: 8-22 mins                     Yuvonne Lanahan Pam Drown, PT Acute Rehabilitation Services Pager: [phone] Office: [place] 02/22/2019, 1:39 PM

## 2019-02-22 NOTE — Progress Notes (Signed)
   Subjective:   The patient stated that he is doing well. He states that he is feeling very dehydrated and tired. He reports that his breathing and swelling has been getting better. Denied any new complaints today.   Objective:  Vital signs in last 24 hours: Vitals:   02/21/19 1932 02/21/19 2016 02/22/19 0336 02/22/19 0344  BP: (!) 97/59   120/67  Pulse: (!) 57 60  62  Resp: 18 16  18   Temp: (!) 97.2 F (36.2 C)   97.8 F (36.6 C)  TempSrc: Oral     SpO2: 100% 100%  100%  Weight:   90.6 kg   Height:        General: Tired appearing male, NAD, sitting in chair Cardiac: Irregularly irregular, systolic murmur with click Pulmonary: Minimal bibasilar crackles, normal work of breathig MSK: 2+ BL LE edema up to shin area  Assessment/Plan:  Active Problems:   Acute on chronic heart failure (HCC)   Pressure injury of skin   Acute on chronic congestive heart failure (HCC)   Anasarca  This is a 58 year old male with a history of half-pack, mechanical AVR status post Bentall procedure 1988, severe mitral regurgitation status postmechanicalvalve in May 2016, CAD status post SVG-RCA, chronic A. fib, ascending aortic dissection, diabetes mellitus, hypertension, OSA on CPAP, PVD, testicular cancer status post medication treatment and surgical resection who presented with worsening lower extremity edema. Noted to have an acute on chronic heart failure.  Acute on chronic heart failure with preserved EF exacebation: Likely due to dietary indiscretion. Getting IV lasix 120 mg BID now, and metolazone 5 mg prior to first dose. Had PICC line placed yesterday for access. Net output of 2.3 L over 24 hours. He is down to 199 Lbs today, from 218 lbs on admission. Cr stable today. Overall he is having good response to the current diuretics, still appears fluid overloaded on exam but has symptomatic improvement. Closer to dry weight. Will need to continue diuresis.   -Continue lasix 120 mg BID  -Continue metolazone 5 mg daily -Cardiology following, appreciate recs -Strict I+Os -Daily weights -Daily BMP -PT/OT > HH PT -Home health orders placed  NSVT: Electrolyte abnormalities: -K low at 3.0, Mag 1.5. Repleting with PO K, and IV mag and K. Repeat in AM.   -BMP and Mag in AM  Microcytic Anemia:  Hgb down to 9.2, MCV 69, this is where he has been since admission. No evidence of bleeding at this time. Previous iron studies showed some anemia of chronic disease. However given his current heart failure there may be benefit with iron supplementation. Will give a dose of IV iron now.   -Feraheme infusion -CBC in AM  Afib Mechanical AVR and MVR: Still rate controlled, on metoprolol 25 mg BID. Continued warfarin, added ASA while here.  -Continuemetoprolol25 mg BID -Continue ASA 81 mg daily -Continue home warfarin, per pharmacy -Continue to hold digoxin, discontinue on discharge  Hypervolemic hyponatremia: Due to volume overload. Na 126 on admission,improved to 130.Na 131 today. -Continue diuresis  Chronic pain syndrome: -Continue oxycodone, gabapentin and robaxin. Patient can take all together.  FEN: No fluids, replete lytes prn, HH diet VTE ppx: Warfarin Code Status: FULL    Dispo: Anticipated discharge in approximately 1-3 day(s).   [name], MD 02/22/2019, 6:49 AM Pager: [phone]

## 2019-02-22 NOTE — TOC Initial Note (Addendum)
Transition of Care [facility]) - Initial/Assessment Note    Patient Details  Name: Lance Cook MRN: [id number] Date of Birth: Jul 28, 1961  Transition of Care [facility]) CM/SW Contact:    Bartholomew Crews, RN Phone Number: [phone] 02/22/2019, 3:50 PM  Clinical Narrative:                 Spoke with patient at bedside. PTA home with spouse. States that spouse is blind with very limited vision in one eye - no vision in the other, and also has very limited hearing.   Does have a friend who offers some assist and depending on the day may be able to provide transportation home.   Pharmacy - uses a pharmacy that delivers which is located near [employer].   Reports that he is active with [facility] and wants to continue with their services. Verified that patient is active for RN - PT added. Patient needs [facility] RN and PT order with face to face.   DME - has walker at home. Uses cpap which he received from [facility], but does not have oxygen for it stating that his insurance wouldn't pay for it. Spoke with [employer] who advised if patient may need oxygen, then a walking ambulation test should be done within in 48 hours of discharge to assess qualifying need.   Expressed concerns about making ends meet given medical bills. States he went to DSS to apply for medicaid, but they said he made too much for any assistance. Patient became tearful and expressed concerns about his declining health. Offered spiritual consult which patient was agreeable.   CM to follow for transition of care needs.   Expected Discharge Plan: [place] Barriers to Discharge: Continued Medical Work up   Patient Goals and CMS Choice   CMS Medicare.gov Compare Post Acute Care list provided to:: Patient Choice offered to / list presented to : Patient  Expected Discharge Plan and Services Expected Discharge Plan: [place] In-house Referral: Chaplain Discharge Planning Services: CM Consult Post Acute Care  Choice: Resumption of Svcs/PTA Provider Living arrangements for the past 2 months: Single Family Home                 DME Arranged: Oxygen DME Agency: [facility] Date DME Agency Contacted: 02/22/19 Time DME Agency Contacted: [place] Arranged: RN, PT [place] Agency: [facility] ([facility])        Prior Living Arrangements/Services Living arrangements for the past 2 months: [place] Lives with:: Self, Spouse Patient language and need for interpreter reviewed:: Yes            Current home services: DME, Home RN Criminal Activity/Legal Involvement Pertinent to Current Situation/Hospitalization: No - Comment as needed  Activities of Daily Living Home Assistive Devices/Equipment: Environmental consultant (specify type) ADL Screening (condition at time of admission) Patient's cognitive ability adequate to safely complete daily activities?: Yes Is the patient deaf or have difficulty hearing?: No Does the patient have difficulty seeing, even when wearing glasses/contacts?: No Does the patient have difficulty concentrating, remembering, or making decisions?: No Patient able to express need for assistance with ADLs?: Yes Does the patient have difficulty dressing or bathing?: Yes Independently performs ADLs?: Yes (appropriate for developmental age) Does the patient have difficulty walking or climbing stairs?: Yes Weakness of Legs: Both Weakness of Arms/Hands: None  Permission Sought/Granted                  Emotional Assessment  Appearance:: Appears stated age Attitude/Demeanor/Rapport: Engaged Affect (typically observed): Accepting, Tearful/Crying, Hopeless Orientation: : Oriented to Self, Oriented to Place, Oriented to  Time, Oriented to Situation      Admission diagnosis:  Anasarca [R60.1] Acute on chronic congestive heart failure, unspecified heart failure type [facility]) [I50.9] Patient Active Problem List   Diagnosis Date Noted  . Acute on chronic congestive heart failure  ([place])   . Anasarca   . Pressure injury of skin 02/14/2019  . Acute on chronic heart failure ([place]) 02/13/2019  . Benign neoplasm of ascending colon   . Benign neoplasm of rectum   . Benign neoplasm of rectosigmoid junction   . Benign neoplasm of sigmoid colon   . DM type 2 with diabetic dyslipidemia ([place]) 04/26/2018  . Lower extremity edema 03/08/2018  . Other fatigue 01/28/2018  . Chronic anticoagulation 01/05/2018  . Insomnia 11/23/2017  . Unilateral vocal cord paralysis 11/30/2016  . Leg swelling 11/28/2016  . S/P MVR (mitral valve replacement)   . Acute on chronic diastolic CHF (congestive heart failure) ([place]) 08/27/2016  . Chronic pain syndrome 07/10/2016  . Chronic diarrhea   . Benign neoplasm of transverse colon   . Scrotal swelling 05/30/2016  . Musculoskeletal pain 01/07/2016  . Personal history of malignant neoplasm of testis 07/12/2015  . Long term current use of opiate analgesic 07/11/2015  . Long term prescription opiate use 07/11/2015  . Opiate use (22.5 MME/day) 07/11/2015  . Opiate dependence ([place]) 07/11/2015  . Encounter for therapeutic drug level monitoring 07/11/2015  . Chronic low back pain (midline) 07/11/2015  . Lumbar facet syndrome 07/11/2015  . Chronic lower extremity pain (Left) 07/11/2015  . Adult BMI 30+ 06/19/2015  . Obesity (BMI 30-39.9) 06/19/2015  . Hypomagnesemia 02/08/2015  . Physical deconditioning 02/08/2015  . Enteritis due to Clostridium difficile 01/30/2015  . 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 34/19/6222  . Chronic diastolic congestive heart failure ([place])   . Type 2 diabetes mellitus with other specified complication ([place]) 97/98/9211  . 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 Venous insufficiency of both lower extremities 04/02/2014  . Morbid obesity ([place]) 04/02/2014  . Type II or unspecified type diabetes mellitus with other specified manifestations, not stated as  uncontrolled 03/14/2014  . Personal history of transient ischemic attack (TIA), and cerebral infarction without residual deficits 02/23/2014  . Malignant neoplasm of testis ([place]) 02/23/2014  . H/O TIA (transient ischemic attack) and stroke 02/23/2014  . HTN (hypertension) 02/23/2014  . Lymphadenopathy 02/23/2014  . S/P AVR (aortic valve replacement) 02/23/2014  . Encounter for therapeutic drug monitoring 02/02/2014  . Long term current use of anticoagulant therapy 07/15/2012  . Hypogonadism male 04/08/2012  . 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 Testicular hypofunction 04/08/2012  . Male hypogonadism 04/08/2012  . Other testicular hypofunction 04/08/2012  . History of colonic polyps 12/23/2011  . Testicular cancer ([place])   . TIA (transient ischemic attack)   . OSA (obstructive sleep apnea)   . Lumbar spinal stenosis (Severe L4-5) 10/08/2011  . ED (erectile dysfunction) of organic origin 10/08/2011  . Lumbar canal stenosis 10/08/2011  . Low back pain 10/08/2011  . Spinal stenosis of lumbar region without neurogenic claudication 10/08/2011  . CAD (coronary artery disease)   . Spondylosis 04/29/2010  . DYSLIPIDEMIA 01/15/2010  . Chronic venous hypertension with ulcer ([place]) 01/15/2010  . Essential hypertension 12/14/2009  . Gout 11/01/2009  . Anemia, iron deficiency 11/01/2009  . MYOCARDIAL INFARCTION, HX OF  11/01/2009  . Atrial fibrillation, chronic 11/01/2009  . PERIPHERAL EDEMA 11/01/2009  . Heart valve replaced by other means 11/01/2009  . Presence of other heart-valve replacement 11/01/2009  . Ascending aortic dissection ([place]) 07/14/2008  . 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 Venous (peripheral) insufficiency 02/03/2005   PCP:  [name], MD Pharmacy:   [facility], [place] - [address] AT [place] [address] Phone: [phone] Fax: [phone]  [facility] - [place], [place] - [address] [place] [address] Phone: [phone] Fax: [phone]  [facility] - [place], [place] [address] Phone: [phone] Fax: [phone]     Social Determinants of Health (SDOH) Interventions    Readmission Risk Interventions No flowsheet data found.

## 2019-02-23 ENCOUNTER — Inpatient Hospital Stay: Payer: Self-pay

## 2019-02-23 ENCOUNTER — Telehealth: Payer: Self-pay | Admitting: Cardiology

## 2019-02-23 LAB — CBC
HCT: 30 % — ABNORMAL LOW (ref 39.0–52.0)
Hemoglobin: 8.6 g/dL — ABNORMAL LOW (ref 13.0–17.0)
MCH: 20 pg — ABNORMAL LOW (ref 26.0–34.0)
MCHC: 28.7 g/dL — ABNORMAL LOW (ref 30.0–36.0)
MCV: 69.9 fL — ABNORMAL LOW (ref 80.0–100.0)
Platelets: 188 10*3/uL (ref 150–400)
RBC: 4.29 MIL/uL (ref 4.22–5.81)
RDW: 19.5 % — ABNORMAL HIGH (ref 11.5–15.5)
WBC: 7.5 10*3/uL (ref 4.0–10.5)
nRBC: 0 % (ref 0.0–0.2)

## 2019-02-23 LAB — GLUCOSE, CAPILLARY
Glucose-Capillary: 73 mg/dL (ref 70–99)
Glucose-Capillary: 78 mg/dL (ref 70–99)
Glucose-Capillary: 122 mg/dL — ABNORMAL HIGH (ref 70–99)
Glucose-Capillary: 78 mg/dL (ref 70–99)

## 2019-02-23 LAB — BASIC METABOLIC PANEL
Anion gap: 9 (ref 5–15)
BUN: 32 mg/dL — ABNORMAL HIGH (ref 6–20)
CO2: 36 mmol/L — ABNORMAL HIGH (ref 22–32)
Calcium: 7.7 mg/dL — ABNORMAL LOW (ref 8.9–10.3)
Chloride: 84 mmol/L — ABNORMAL LOW (ref 98–111)
Creatinine, Ser: 0.91 mg/dL (ref 0.61–1.24)
GFR calc Af Amer: >60 mL/min (ref >60)
GFR calc non Af Amer: >60 mL/min (ref >60)
Glucose, Bld: 69 mg/dL — ABNORMAL LOW (ref 70–99)
Potassium: 2.8 mmol/L — ABNORMAL LOW (ref 3.5–5.1)
Sodium: 129 mmol/L — ABNORMAL LOW (ref 135–145)

## 2019-02-23 LAB — PROTIME-INR
INR: 2.5 — ABNORMAL HIGH (ref 0.8–1.2)
Prothrombin Time: 26.3 seconds — ABNORMAL HIGH (ref 11.4–15.2)

## 2019-02-23 LAB — MAGNESIUM: Magnesium: 2 mg/dL (ref 1.7–2.4)

## 2019-02-23 MED ORDER — WARFARIN SODIUM 5 MG PO TABS
5.0000 mg | ORAL_TABLET | Freq: Once | ORAL | Status: AC
  Administered 2019-02-23: 5 mg via ORAL
  Filled 2019-02-23: qty 1

## 2019-02-23 MED ORDER — POTASSIUM CHLORIDE CRYS ER 20 MEQ PO TBCR
40.0000 meq | EXTENDED_RELEASE_TABLET | Freq: Once | ORAL | Status: AC
  Administered 2019-02-23: 40 meq via ORAL
  Filled 2019-02-23: qty 2

## 2019-02-23 MED ORDER — POTASSIUM CHLORIDE CRYS ER 20 MEQ PO TBCR
40.0000 meq | EXTENDED_RELEASE_TABLET | Freq: Once | ORAL | Status: AC
  Administered 2019-02-23: 40 meq via ORAL

## 2019-02-23 MED ORDER — POTASSIUM CHLORIDE 10 MEQ/100ML IV SOLN
10.0000 meq | INTRAVENOUS | Status: AC
  Administered 2019-02-23 (×5): 10 meq via INTRAVENOUS
  Filled 2019-02-23 (×5): qty 100

## 2019-02-23 MED ORDER — POTASSIUM CHLORIDE CRYS ER 20 MEQ PO TBCR
40.0000 meq | EXTENDED_RELEASE_TABLET | Freq: Every day | ORAL | Status: DC
  Administered 2019-02-24: 40 meq via ORAL
  Filled 2019-02-23 (×2): qty 2

## 2019-02-23 MED ORDER — OXYCODONE HCL 5 MG PO TABS
5.0000 mg | ORAL_TABLET | Freq: Three times a day (TID) | ORAL | Status: DC | PRN
  Administered 2019-02-23 – 2019-02-27 (×11): 5 mg via ORAL
  Filled 2019-02-23 (×12): qty 1

## 2019-02-23 NOTE — Telephone Encounter (Signed)
Patient's wife returned call.  

## 2019-02-23 NOTE — Progress Notes (Signed)
Inpatient Diabetes Program Recommendations  AACE/ADA: New Consensus Statement on Inpatient Glycemic Control (2015)  Target Ranges:  Prepandial:   less than 140 mg/dL      Peak postprandial:   less than 180 mg/dL (1-2 hours)      Critically ill patients:  140 - 180 mg/dL   Lab Results  Component Value Date   GLUCAP 73 02/23/2019   HGBA1C 5.2 02/14/2019    Review of Glycemic Control Results for Lance Cook, Lance Cook (MRN [id number]) as of 02/23/2019 11:14  Ref. Range 02/22/2019 11:20 02/22/2019 16:18 02/22/2019 21:21 02/23/2019 05:47  Glucose-Capillary Latest Ref Range: 70 - 99 mg/dL 73 69 (L) 118 (H) 73   Inpatient Diabetes Program Recommendations:   -D/C current Novolog correction scale for now. Continue CBGs-continue to run low.  Thanks, Bronson Curb, MSN, RNC-OB Diabetes Coordinator [phone] (8a-5p)

## 2019-02-23 NOTE — Progress Notes (Signed)
Spoke with MD Lonia Skinner and she request another PICC line be placed in the left arm and PICC be removed from the right arm due to possible reaction to the adhesive tape. Also requesting a different type of dressing to be placed to PICC once completed.

## 2019-02-23 NOTE — Progress Notes (Signed)
Responded to spiritual care consult. On two attempts to visit PT, pt was not able to talk around noon. First, he had just received his lunch and being treated by staff and the second time was around 235 pm. Pt was sleeping. I will pass it on to incoming Chaplain. Chaplain Fidel Levy [phone]

## 2019-02-23 NOTE — Progress Notes (Signed)
Spoke to primary RN, PICC placement will be done by IR per patient request.

## 2019-02-23 NOTE — Progress Notes (Signed)
Patient has removed dressing from around the PICC Line PICC Catheter is exposed half way IV consult this morning and this evening to Reassess and treat PICC Line Partial Removal

## 2019-02-23 NOTE — Progress Notes (Signed)
   Subjective:   The patient states that he has not been able to sleep well yesterday. Feels that he has cotton mouth and had more to drink yesterday. Patient states that the cover on his picc line is bothering him and he needs it changed.   Objective:  Vital signs in last 24 hours: Vitals:   02/22/19 1616 02/22/19 1948 02/22/19 2052 02/23/19 0319  BP: 100/62 114/63  (!) 93/52  Pulse: (!) 54 62 60 (!) 55  Resp: 16  16   Temp: 97.6 F (36.4 C) 97.9 F (36.6 C)  97.7 F (36.5 C)  TempSrc: Oral Oral  Oral  SpO2: 100% 93% 95% 94%  Weight:    90.4 kg  Height:        General: Tired appearing male, NAD, laying in bed Cardiac: Irregularly irregular, systolic murmur with click Pulmonary: Minimal bibasilar crackles, normal work of breathing, on RA Extremity: 1-2+ BL LE edema   Assessment/Plan:  Active Problems:   Acute on chronic heart failure (HCC)   Pressure injury of skin   Acute on chronic congestive heart failure (HCC)   Anasarca  This is a 58 year old male with a history of half-pack, mechanical AVR status post Bentall procedure 1988, severe mitral regurgitation status postmechanicalvalve in May 2016, CAD status post SVG-RCA, chronic A. fib, ascending aortic dissection, diabetes mellitus, hypertension, OSA on CPAP, PVD, testicular cancer status post medication treatment and surgical resection who presented with worsening lower extremity edema. Noted to have an acute on chronic heart failure.  Acute on chronic heart failure with preserved EF exacebation: Likely due to dietary indiscretion. Getting IV lasix 120 mg BID and metolazone 5 mg prior to morning dose. PICC line in place. Intake of 3L, output of 3L, net equal. Wt is similar to yesterday, at 199 lbs, down to 218 on admission. Cr remains stable at 0.91. On exam he still has some fluid overload, however appears to be improving. Will continue diuresis per cards. Cardiology saw this AM, stopped metolazone, increased K  supplementation.   -Cardiology following, appreciate recs -Continue lasix 120 mg BID -Discontinue metolazone 5 mg daily -Strict I+Os -Daily weights -Daily BMP -PT/OT > HH PT -Home health orders placed  NSVT: Electrolyte abnormalities: -K still low at 2.8, Mag improved to 2.0. Repleting with PO K, and IV K. Repeat in AM.   -BMP in AM  Microcytic Anemia:  Hgb down to 8.6 today,  MCV 69, small decrease in Hgb today, from 9.2. Received feraheme infusion yesterday. More likely related to anemia of chronic disease. No evidence of bleeding at this time. Will continue to monitor.    -CBC in AM  Afib Mechanical AVR and MVR: Rate controlled, on metoprolol 25 mg BID. Continued warfarin, added ASA while here.  -Continuemetoprolol25 mg BID -Continue ASA 81 mg daily -Continue home warfarin, per pharmacy -Continue to hold digoxin, discontinue on discharge  Hypervolemic hyponatremia: Due to volume overload. Na 126 on admission,improved to 130.Na 129 today, advised patient to continue fluid restriction, this is likely due to hypervolemia hyponatremia from fluid overload. . -Continue diuresis  Chronic pain syndrome: -Continue oxycodone, gabapentin and robaxin. Patient can take all together.  FEN: No fluids, replete lytes prn, HH diet VTE ppx: Warfarin Code Status: FULL    Dispo: Anticipated discharge in approximately 1-3 day(s).   [name], MD 02/23/2019, 6:37 AM Pager: [phone]

## 2019-02-23 NOTE — Telephone Encounter (Signed)
New Message   Patients wife is calling on behalf of spouse. She states that's imperative that she speak with the doctor. Her husband is currently admitted to the hospital and is upset but something. But she would never say what he is upset about. Please call to discuss.

## 2019-02-23 NOTE — Telephone Encounter (Signed)
Spoke with pt's wife who was very upset. Wife state she feels her husband is not getting the care that is needed at the hospital by his nurses and want to  speak with Dr. [name]. Advised wife if she is having concerns about pt's care she should contact the director for that department. Wife state she has already spoken to them but nothing is being addressed and demanding Dr. [name] give her a call.  Will route to MD

## 2019-02-23 NOTE — Progress Notes (Signed)
[place] for warfarin Indication: atrial fibrillation, mech AVR, mech MVR  Allergies  Allergen Reactions  . Penicillins Hives    Has patient had a PCN reaction causing immediate rash, facial/tongue/throat swelling, SOB or lightheadedness with hypotension: Yes Has patient had a PCN reaction causing severe rash involving mucus membranes or skin necrosis: Yes Has patient had a PCN reaction that required hospitalization unsure Has patient had a PCN reaction occurring within the last 10 years: 2010-2012 If all of the above answers are "NO", then may proceed with Cephalosporin use.  . Adhesive [Tape] Other (See Comments)    Skin irritation  . Bactrim [Sulfamethoxazole-Trimethoprim] Other (See Comments)    CDIFF, when taking oral tablets    Patient Measurements: Height: 5\' 1"  (154.9 cm) Weight: 199 lb 3.2 oz (90.4 kg)(scale b) IBW/kg (Calculated) : 52.3 Heparin Dosing Weight: 75.6 kg  Vital Signs: Temp: 97.7 F (36.5 C) (06/24 0319) Temp Source: Oral (06/24 0319) BP: 93/52 (06/24 0319) Pulse Rate: 55 (06/24 0319)  Labs: Recent Labs    02/21/19 0709 02/22/19 0552 02/22/19 0752 02/23/19 0506  HGB 9.1* 9.2*  --  8.6*  HCT 32.1* 32.0*  --  30.0*  PLT 209 220  --  188  LABPROT 33.5* 27.2*  --  26.3*  INR 3.4* 2.6*  --  2.5*  CREATININE 1.06  --  0.98 0.91    Estimated Creatinine Clearance: 85.5 mL/min (by C-G formula based on SCr of 0.91 mg/dL).   Assessment:  58 yo male on chronic Coumadin for afib, mechanical MVR and mechanical AVR.  INR goal 2.5-3.5. -INR therapeutic at 2.5    PTA Coumadin dose 5 mg daily except 2.5 mg on Mondays.  Recently held dose 6/9 (PTA) for supratherapeutic INR of 4.7.  Goal of Therapy:  INR 2.5-3.5 Monitor platelets by anticoagulation protocol: Yes   Plan:  Warfarin 5 mg po x 1 tonight Daily INR  Hildred Laser, PharmD Clinical Pharmacist **Pharmacist phone directory can now be found on [email or web address] (PW  [email or web address]).  Listed under [place].

## 2019-02-23 NOTE — Progress Notes (Signed)
Progress Note  Patient Name: Lance Cook Date of Encounter: 02/23/2019  Primary Cardiologist:   [name], MD   Subjective   Ambulated with PT with good O2 sats even off of O2.  He says that his breathing is "passable"    Inpatient Medications    Scheduled Meds:  aspirin EC  81 mg Oral Daily   gabapentin  200-400 mg Oral Q8H   Gerhardt's butt cream   Topical BID   insulin aspart  0-15 Units Subcutaneous TID WC   methocarbamol  500 mg Oral Q8H   metolazone  5 mg Oral Daily   metoprolol succinate  25 mg Oral BID   mupirocin ointment  1 application Topical TID   potassium chloride  40 mEq Oral Once   potassium chloride  40 mEq Oral Once   [START ON 02/24/2019] potassium chloride  40 mEq Oral Daily   sodium chloride flush  10-40 mL Intracatheter Q12H   traZODone  100 mg Oral QHS   Warfarin - Pharmacist Dosing Inpatient   Does not apply q1800   Continuous Infusions:  sodium chloride Stopped (02/16/19 1638)   furosemide 120 mg (02/22/19 1725)   PRN Meds: sodium chloride, acetaminophen **OR** acetaminophen, barrier cream, oxyCODONE, promethazine, senna-docusate, sodium chloride flush, sodium chloride flush   Vital Signs    Vitals:   02/22/19 1616 02/22/19 1948 02/22/19 2052 02/23/19 0319  BP: 100/62 114/63  (!) 93/52  Pulse: (!) 54 62 60 (!) 55  Resp: 16  16   Temp: 97.6 F (36.4 C) 97.9 F (36.6 C)  97.7 F (36.5 C)  TempSrc: Oral Oral  Oral  SpO2: 100% 93% 95% 94%  Weight:    90.4 kg  Height:        Intake/Output Summary (Last 24 hours) at 02/23/2019 0820 Last data filed at 02/23/2019 0300 Gross per 24 hour  Intake 2772.47 ml  Output 3075 ml  Net -302.53 ml   Filed Weights   02/21/19 0329 02/22/19 0336 02/23/19 0319  Weight: 90.4 kg 90.6 kg 90.4 kg    Telemetry    Atrial fib- Personally Reviewed  ECG    NA - Personally Reviewed  Physical Exam   GEN: No  acute distress.   Neck: No  JVD Cardiac: IrregularRR, 2/6 systolic  murmur, no diastolic murmurs, rubs, or gallops.  Respiratory: Clear   to auscultation bilaterally. GI: Soft, nontender, non-distended, normal bowel sounds  MS:  Severe edema; No deformity. Neuro:   Nonfocal  Psych: Oriented and appropriate   Labs    Chemistry Recent Labs  Lab 02/17/19 1026  02/21/19 0709 02/22/19 0752 02/23/19 0506  NA  --    < > 134* 131* 129*  K  --    < > 3.3* 3.0* 2.8*  CL  --    < > 86* 84* 84*  CO2  --    < > 38* 36* 36*  GLUCOSE  --    < > 84 101* 69*  BUN  --    < > 25* 34* 32*  CREATININE  --    < > 1.06 0.98 0.91  CALCIUM  --    < > 8.0* 7.7* 7.7*  ALBUMIN 1.8*  --   --   --   --   GFRNONAA  --    < > >60 >60 >60  GFRAA  --    < > >60 >60 >60  ANIONGAP  --    < > 10 11 9    < > =  values in this interval not displayed.     Hematology Recent Labs  Lab 02/21/19 0709 02/22/19 0552 02/23/19 0506  WBC 7.7 8.8 7.5  RBC 4.58 4.59 4.29  HGB 9.1* 9.2* 8.6*  HCT 32.1* 32.0* 30.0*  MCV 70.1* 69.7* 69.9*  MCH 19.9* 20.0* 20.0*  MCHC 28.3* 28.8* 28.7*  RDW 19.7* 19.9* 19.5*  PLT 209 220 188    Cardiac EnzymesNo results for input(s): TROPONINI in the last 168 hours. No results for input(s): TROPIPOC in the last 168 hours.   BNPNo results for input(s): BNP, PROBNP in the last 168 hours.   DDimer No results for input(s): DDIMER in the last 168 hours.   Radiology    Ir Picc Placement Right >5 Yrs Inc Img Guide  Result Date: 02/21/2019 CLINICAL DATA:  Heart failure, needs durable venous access EXAM: PICC PLACEMENT WITH ULTRASOUND AND FLUOROSCOPY FLUOROSCOPY TIME:  0.1 minute; 19 uGym2 DAP TECHNIQUE: After written informed consent was obtained, patient was placed in the supine position on angiographic table. Patency of the right basilic vein was confirmed with ultrasound with image documentation. An appropriate skin site was determined. Skin site was marked. Region was prepped using maximum barrier technique including cap and mask, sterile gown, sterile  gloves, large sterile sheet, and Chlorhexidine as cutaneous antisepsis. The region was infiltrated locally with 1% lidocaine. Under real-time ultrasound guidance, the right basilic vein was accessed with a 21 gauge micropuncture needle; the needle tip within the vein was confirmed with ultrasound image documentation. Needle exchanged over a 018 guidewire for a peel-away sheath, through which a 5-French double-lumen power injectable PICC trimmed to 37cm was advanced, positioned with its tip near the cavoatrial junction. Spot chest radiograph confirms appropriate catheter position. Catheter was flushed per protocol and secured externally. The patient tolerated procedure well. COMPLICATIONS: COMPLICATIONS none IMPRESSION: 1. Technically successful five [place] double lumen power injectable PICC placement Electronically Signed   By: Lucrezia Europe M.D.   On: 02/21/2019 16:40    Cardiac Studies   Echo:    1. The left ventricle has normal systolic function, with an ejection fraction of 55-60%. The cavity size was normal. Left ventricular diastolic function could not be evaluated due to mitral valve replacement/repair. There is abnormal septal motion  consistent with left bundle branch block.  2. Mild hypokinesis of the left ventricular, entire septal wall.  3. The right ventricle has normal systolic function. The cavity was normal. There is no increase in right ventricular wall thickness. Right ventricular systolic pressure is severely elevated with an estimated pressure of 62.4 mmHg.  4. Left atrial size was severely dilated.  5. Right atrial size was severely dilated.  6. A 33 St. Jude mechanical valve is present in the mitral position. Procedure Date: 01/24/2015 There is no Echo evidence of rocking of the mitral prosthesis. Echo findings are consistent with perivalvular leak the mitral prosthesis.  7. Mitral valve regurgitation mild perivalvular. Mitral valve gradient appropriate for prosthesis mitral valve  stenosis.  8. The tricuspid valve is grossly normal. Tricuspid valve regurgitation is moderate.  9. A 22mm St. Jude mechanical prosthesis valve is present in the aortic position. Normal aortic valve prosthesis. Echo findings are consistent with Mean gradient ~30 mmHg of the aortic prosthesis. 10. The aortic arch is normal in size and structure. 11. There is dilatation of the ascending aorta measuring 38 mm. 12. Post Bentall 1988. 13. The inferior vena cava was dilated in size with <50% respiratory variability.  Patient Profile  58 y.o. male with acute on chronic diastolic heart failure admitted with volume overload.  Assessment & Plan    ACUTE ON CHRONIC DIASTOLIC HF:  Negative 83.0 liters.   Output either reduced or not recorded yesterday.  Weight is stable.    I think we might be reaching the limit of this admission and the ability to diurese but will make adjustments as below.    He needs close home health nursing follow up at home to try to continue his weight loss and diuresis.   MECHANICAL AORTIC VALVE:   Normal valve function on echo.  Continue current therapy.   MECHANICAL MITRAL VALVE:    Essentially normal function in the mitral position.  Mild perivalvular leak .Continue current therapy.  PERMANENT ATRIAL FIB:  On ASA and therapeutic warfarin.   Continue per pharamcy  NSVT:    Potassium pending.   Needs significant potassium supplementation today.  It looks like he had about 170 meq yesterday but potassium is still down.  I am going to stop the Zaroxolyn.  Continue IV Lasix and he will need likely around 200 meq today.   Magnesium was OK yesterday.  I am going to take the liberty of giving him 8 runs of 10 meq and increasing to 120 meq PO.   For questions or updates, please contact [place] Please consult [email or web address] for contact info under Cardiology/STEMI.   Signed, [name], MD  02/23/2019, 8:20 AM

## 2019-02-23 NOTE — Progress Notes (Signed)
RT placed pt on CPAP dream station in auto titrate with the settings os max 20 min 5 with 2 Lpm bled into the system for the night. Pt respiratory status is stable at this time. RT will continue to monitor.

## 2019-02-23 NOTE — Telephone Encounter (Signed)
Attempted to contact pt's wife and mailbox full unable to leave message .Adonis Housekeeper

## 2019-02-24 LAB — PROTIME-INR
INR: 2.3 — ABNORMAL HIGH (ref 0.8–1.2)
Prothrombin Time: 24.5 seconds — ABNORMAL HIGH (ref 11.4–15.2)

## 2019-02-24 LAB — GLUCOSE, CAPILLARY
Glucose-Capillary: 68 mg/dL — ABNORMAL LOW (ref 70–99)
Glucose-Capillary: 77 mg/dL (ref 70–99)
Glucose-Capillary: 68 mg/dL — ABNORMAL LOW (ref 70–99)
Glucose-Capillary: 82 mg/dL (ref 70–99)
Glucose-Capillary: 95 mg/dL (ref 70–99)

## 2019-02-24 LAB — BASIC METABOLIC PANEL
Anion gap: 6 (ref 5–15)
BUN: 29 mg/dL — ABNORMAL HIGH (ref 6–20)
CO2: 38 mmol/L — ABNORMAL HIGH (ref 22–32)
Calcium: 8 mg/dL — ABNORMAL LOW (ref 8.9–10.3)
Chloride: 87 mmol/L — ABNORMAL LOW (ref 98–111)
Creatinine, Ser: 0.99 mg/dL (ref 0.61–1.24)
GFR calc Af Amer: >60 mL/min (ref >60)
GFR calc non Af Amer: >60 mL/min (ref >60)
Glucose, Bld: 77 mg/dL (ref 70–99)
Potassium: 3.6 mmol/L (ref 3.5–5.1)
Sodium: 131 mmol/L — ABNORMAL LOW (ref 135–145)

## 2019-02-24 MED ORDER — SODIUM CHLORIDE 0.9% FLUSH
10.0000 mL | Freq: Two times a day (BID) | INTRAVENOUS | Status: DC
  Administered 2019-02-24 – 2019-02-27 (×6): 10 mL

## 2019-02-24 MED ORDER — SODIUM CHLORIDE 0.9% FLUSH: 10.0000 mL | INTRAVENOUS | Status: DC | PRN

## 2019-02-24 MED ORDER — POTASSIUM CHLORIDE CRYS ER 20 MEQ PO TBCR
40.0000 meq | EXTENDED_RELEASE_TABLET | Freq: Three times a day (TID) | ORAL | Status: DC
  Administered 2019-02-24 – 2019-02-27 (×11): 40 meq via ORAL
  Filled 2019-02-24 (×11): qty 2

## 2019-02-24 MED ORDER — WARFARIN SODIUM 7.5 MG PO TABS
7.5000 mg | ORAL_TABLET | Freq: Once | ORAL | Status: AC
  Administered 2019-02-24: 7.5 mg via ORAL
  Filled 2019-02-24: qty 1

## 2019-02-24 NOTE — Plan of Care (Signed)
°  Problem: Education: °Goal: Ability to demonstrate management of disease process will improve °Outcome: Progressing °  °Problem: Education: °Goal: Ability to verbalize understanding of medication therapies will improve °Outcome: Progressing °  °Problem: Activity: °Goal: Capacity to carry out activities will improve °Outcome: Progressing °  °

## 2019-02-24 NOTE — Progress Notes (Signed)
[place] for warfarin Indication: atrial fibrillation, mech AVR, mech MVR  Allergies  Allergen Reactions  . Penicillins Hives    Has patient had a PCN reaction causing immediate rash, facial/tongue/throat swelling, SOB or lightheadedness with hypotension: Yes Has patient had a PCN reaction causing severe rash involving mucus membranes or skin necrosis: Yes Has patient had a PCN reaction that required hospitalization unsure Has patient had a PCN reaction occurring within the last 10 years: 2010-2012 If all of the above answers are "NO", then may proceed with Cephalosporin use.  . Adhesive [Tape] Other (See Comments)    Skin irritation  . Bactrim [Sulfamethoxazole-Trimethoprim] Other (See Comments)    CDIFF, when taking oral tablets    Patient Measurements: Height: 5\' 1"  (154.9 cm) Weight: 199 lb 3.2 oz (90.4 kg) IBW/kg (Calculated) : 52.3 Heparin Dosing Weight: 75.6 kg  Vital Signs: Temp: 98.5 F (36.9 C) (06/25 0358) Temp Source: Oral (06/25 0358) BP: 107/50 (06/25 0358) Pulse Rate: 73 (06/25 0358)  Labs: Recent Labs    02/22/19 0552 02/22/19 0752 02/23/19 0506 02/24/19 0650  HGB 9.2*  --  8.6*  --   HCT 32.0*  --  30.0*  --   PLT 220  --  188  --   LABPROT 27.2*  --  26.3* 24.5*  INR 2.6*  --  2.5* 2.3*  CREATININE  --  0.98 0.91 0.99    Estimated Creatinine Clearance: 78.6 mL/min (by C-G formula based on SCr of 0.99 mg/dL).   Assessment:  58 yo male on chronic Coumadin for afib, mechanical MVR and mechanical AVR.  INR goal 2.5-3.5. -INR subtherapeutic at 2.3    PTA Coumadin dose 5 mg daily except 2.5 mg on Mondays.  Recently held dose 6/9 (PTA) for supratherapeutic INR of 4.7.  Goal of Therapy:  INR 2.5-3.5 Monitor platelets by anticoagulation protocol: Yes   Plan:  Warfarin 7.5 mg po x 1 tonight Daily INR  Hildred Laser, PharmD Clinical Pharmacist **Pharmacist phone directory can now be found on [email or web address] (PW  [email or web address]).  Listed under [place].

## 2019-02-24 NOTE — Progress Notes (Signed)
Progress Note  Patient Name: Lance Cook Date of Encounter: 02/24/2019  Primary Cardiologist:   [name], MD   Subjective   He has many complaints about his IV.  No acute SOB or chest pain.  He ambulated.   Inpatient Medications    Scheduled Meds: . aspirin EC  81 mg Oral Daily  . gabapentin  200-400 mg Oral Q8H  . Gerhardt's butt cream   Topical BID  . methocarbamol  500 mg Oral Q8H  . metoprolol succinate  25 mg Oral BID  . mupirocin ointment  1 application Topical TID  . potassium chloride  40 mEq Oral Daily  . sodium chloride flush  10-40 mL Intracatheter Q12H  . traZODone  100 mg Oral QHS  . warfarin  7.5 mg Oral ONCE-1800  . Warfarin - Pharmacist Dosing Inpatient   Does not apply q1800   Continuous Infusions: . sodium chloride Stopped (02/16/19 1638)  . furosemide 120 mg (02/23/19 0855)   PRN Meds: sodium chloride, acetaminophen **OR** acetaminophen, barrier cream, oxyCODONE, promethazine, senna-docusate, sodium chloride flush, sodium chloride flush   Vital Signs    Vitals:   02/23/19 2044 02/23/19 2219 02/24/19 0146 02/24/19 0358  BP: 107/70 (!) 103/40  (!) 107/50  Pulse: 65 63  73  Resp: 20   20  Temp: 98 F (36.7 C)   98.5 F (36.9 C)  TempSrc: Oral   Oral  SpO2: 99%   97%  Weight:   90.4 kg   Height:        Intake/Output Summary (Last 24 hours) at 02/24/2019 0937 Last data filed at 02/24/2019 0933 Gross per 24 hour  Intake 1267.24 ml  Output 2300 ml  Net -1032.76 ml   Filed Weights   02/22/19 0336 02/23/19 0319 02/24/19 0146  Weight: 90.6 kg 90.4 kg 90.4 kg    Telemetry    Atrial fib.  Rare ventricular ectopy - Personally Reviewed  ECG    NA - Personally Reviewed  Physical Exam   GEN: No  acute distress.   Neck: No  JVD Cardiac: IrregularRR, mechanical S1 and S2. rubs, or gallops.  Respiratory: Clear   to auscultation bilaterally. GI: Soft, nontender, non-distended, normal bowel sounds  MS:   Severe edema, improved; No  deformity. Neuro:   Nonfocal  Psych: Oriented and appropriate   Labs    Chemistry Recent Labs  Lab 02/17/19 1026  02/22/19 0752 02/23/19 0506 02/24/19 0650  NA  --    < > 131* 129* 131*  K  --    < > 3.0* 2.8* 3.6  CL  --    < > 84* 84* 87*  CO2  --    < > 36* 36* 38*  GLUCOSE  --    < > 101* 69* 77  BUN  --    < > 34* 32* 29*  CREATININE  --    < > 0.98 0.91 0.99  CALCIUM  --    < > 7.7* 7.7* 8.0*  ALBUMIN 1.8*  --   --   --   --   GFRNONAA  --    < > >60 >60 >60  GFRAA  --    < > >60 >60 >60  ANIONGAP  --    < > 11 9 6    < > = values in this interval not displayed.     Hematology Recent Labs  Lab 02/21/19 0709 02/22/19 0552 02/23/19 0506  WBC 7.7 8.8 7.5  RBC 4.58  4.59 4.29  HGB 9.1* 9.2* 8.6*  HCT 32.1* 32.0* 30.0*  MCV 70.1* 69.7* 69.9*  MCH 19.9* 20.0* 20.0*  MCHC 28.3* 28.8* 28.7*  RDW 19.7* 19.9* 19.5*  PLT 209 220 188    Cardiac EnzymesNo results for input(s): TROPONINI in the last 168 hours. No results for input(s): TROPIPOC in the last 168 hours.   BNPNo results for input(s): BNP, PROBNP in the last 168 hours.   DDimer No results for input(s): DDIMER in the last 168 hours.   Radiology    [place] Ekg Site Rite  Result Date: 02/23/2019 If [facility] image not attached, placement could not be confirmed due to current cardiac rhythm.   Cardiac Studies   Echo:    1. The left ventricle has normal systolic function, with an ejection fraction of 55-60%. The cavity size was normal. Left ventricular diastolic function could not be evaluated due to mitral valve replacement/repair. There is abnormal septal motion  consistent with left bundle branch block.  2. Mild hypokinesis of the left ventricular, entire septal wall.  3. The right ventricle has normal systolic function. The cavity was normal. There is no increase in right ventricular wall thickness. Right ventricular systolic pressure is severely elevated with an estimated pressure of 62.4 mmHg.  4. Left  atrial size was severely dilated.  5. Right atrial size was severely dilated.  6. A 33 St. Jude mechanical valve is present in the mitral position. Procedure Date: 01/24/2015 There is no Echo evidence of rocking of the mitral prosthesis. Echo findings are consistent with perivalvular leak the mitral prosthesis.  7. Mitral valve regurgitation mild perivalvular. Mitral valve gradient appropriate for prosthesis mitral valve stenosis.  8. The tricuspid valve is grossly normal. Tricuspid valve regurgitation is moderate.  9. A 61mm St. Jude mechanical prosthesis valve is present in the aortic position. Normal aortic valve prosthesis. Echo findings are consistent with Mean gradient ~30 mmHg of the aortic prosthesis. 10. The aortic arch is normal in size and structure. 11. There is dilatation of the ascending aorta measuring 38 mm. 12. Post Bentall 1988. 13. The inferior vena cava was dilated in size with <50% respiratory variability.  Patient Profile     58 y.o. male with acute on chronic diastolic heart failure admitted with volume overload.  Assessment & Plan    ACUTE ON CHRONIC DIASTOLIC HF:  Negative 08.6 liters.   He has had a great deal of trouble and I talked with nursing about his IVs.  He tends to pull out the peripherals and remove the tape if it irritates him.  Most of this seems be associated with the many runs of IV potassium that he has needed.  Now that the potassium in improved I will give another 120 mg but try to keep this all PO.  I think that he could get a peripheral IV rather than a PICC for his continue IV Lasix.  If this fails we should probably switch to PO.  I tried to call his wife.   MECHANICAL AORTIC VALVE:  Normal function on echo.  Continue current therapy.   MECHANICAL MITRAL VALVE:    Essentially normal function in the mitral position.  Mild perivalvular leak   PERMANENT ATRIAL FIB:  On ASA and therapeutic warfarin per pharamcy.  Subtherapeutic.     NSVT:    No  further episodes.    Potassium is now supplemented.  NA  For questions or updates, please contact [place] Please consult [email or web address] for contact  info under Cardiology/STEMI.   Signed, [name], MD  02/24/2019, 9:37 AM

## 2019-02-24 NOTE — Progress Notes (Signed)
Physical Therapy Treatment Patient Details Name: Lance Cook MRN: [id number] DOB: 23-Aug-1961 Today's Date: 02/24/2019    History of Present Illness 58 yo admitted with HF exacerbation. PMHx: CHF, AVR, MVR, CAD, AFib, AA dissection, DM, HTN, PVD, testicular CA, CVA at 58yo    PT Comments    Pt pleasant in bed on arrival and reports fatigue from continued diuresis and K replenishment. Pt able to progress gait and HEp this session even though he physically appears more rundown from prior sessions. Pt with improved ability with HEP in chair and encouraged OOB to chair for meals and HEP throughout the day.  Will continue to follow   HR 77    Follow Up Recommendations  Home health PT     Equipment Recommendations  None recommended by PT    Recommendations for Other Services       Precautions / Restrictions Precautions Precautions: Fall    Mobility  Bed Mobility Overal bed mobility: Modified Independent Bed Mobility: Supine to Sit           General bed mobility comments: pt able to transition to sitting with momentum, HOB 15 degrees and no assist  Transfers Overall transfer level: Modified independent               General transfer comment: pt stood from bed and to chair with UE assist  Ambulation/Gait Ambulation/Gait assistance: Min guard Gait Distance (Feet): 240 Feet Assistive device: Rolling walker (2 wheeled) Gait Pattern/deviations: Step-through pattern;Decreased stride length;Wide base of support   Gait velocity interpretation: >2.62 ft/sec, indicative of community ambulatory General Gait Details: pt with increased sway with gait due to bil LE edema and body habitus, cues for proximity to RW. Pt with increased gait tolerance this session with maintained reliance on rW with encouragement to maximize distance   Stairs             Wheelchair Mobility    Modified Rankin (Stroke Patients Only)       Balance Overall balance assessment: Needs  assistance   Sitting balance-Leahy Scale: Good       Standing balance-Leahy Scale: Fair                              Cognition Arousal/Alertness: Awake/alert Behavior During Therapy: WFL for tasks assessed/performed Overall Cognitive Status: Within Functional Limits for tasks assessed                                        Exercises General Exercises - Lower Extremity Long Arc Quad: AROM;Both;Seated;20 reps Hip Flexion/Marching: AROM;Both;Seated;20 reps    General Comments        Pertinent Vitals/Pain Pain Assessment: 0-10 Pain Score: 4  Pain Location: chronic back pain, BLE Pain Descriptors / Indicators: Aching Pain Intervention(s): Limited activity within patient's tolerance;Repositioned    Home Living                      Prior Function            PT Goals (current goals can now be found in the care plan section) Progress towards PT goals: Progressing toward goals    Frequency           PT Plan Current plan remains appropriate    Co-evaluation  AM-PAC PT "6 Clicks" Mobility   Outcome Measure  Help needed turning from your back to your side while in a flat bed without using bedrails?: A Little Help needed moving from lying on your back to sitting on the side of a flat bed without using bedrails?: A Little Help needed moving to and from a bed to a chair (including a wheelchair)?: None Help needed standing up from a chair using your arms (e.g., wheelchair or bedside chair)?: None Help needed to walk in hospital room?: A Little Help needed climbing 3-5 steps with a railing? : A Little 6 Click Score: 20    End of Session   Activity Tolerance: Patient tolerated treatment well Patient left: in chair;with call bell/phone within reach Nurse Communication: Mobility status PT Visit Diagnosis: Muscle weakness (generalized) (M62.81);Other abnormalities of gait and mobility (R26.89)     Time:  0315-9458 PT Time Calculation (min) (ACUTE ONLY): 23 min  Charges:  $Gait Training: 8-22 mins $Therapeutic Exercise: 8-22 mins                     Justus Droke Pam Drown, PT Acute Rehabilitation Services Pager: [phone] Office: [place] 02/24/2019, 1:24 PM

## 2019-02-24 NOTE — Progress Notes (Signed)
Visited with Lance Cook today had a lengthty conversation about his health, his disability, his ill wife, pain,faith and him growing weary.  Per Lance Cook he said the Lance Cook he was doing well and he agrees.  He talked about the great nurse he has at home.  He is very must concerned about his phone he says was misplaced by [facility] staff and he is trying to track with help from his carrier. Lance Cook is experiencing a lot of anxiety over his health and other needs. It may be good for Education officer, museum to speak with him before he is discharged.  Chaplain will follow as needed.  Jaclynn Major, [place], [facility] [address], Pager [phone]

## 2019-02-24 NOTE — Progress Notes (Signed)
   Subjective:  Patient states he overall feels well and strong. Patient states he's still urinating but not as much as he's not been able to get his lasix last night and this morning due to loss of IV access. He does have occasional coughing episodes. He denies nausea, vomiting, or other symptoms.   Objective:  Vital signs in last 24 hours: Vitals:   02/23/19 2044 02/23/19 2219 02/24/19 0146 02/24/19 0358  BP: 107/70 (!) 103/40  (!) 107/50  Pulse: 65 63  73  Resp: 20   20  Temp: 98 F (36.7 C)   98.5 F (36.9 C)  TempSrc: Oral   Oral  SpO2: 99%   97%  Weight:   90.4 kg   Height:        General: Well appearing male, NAD, sitting in chair Cardiac: Irregularly irregular, systolic murmur Pulmonary: Bibasilar crackles, normal work of breathing, no wheezing Abdomen: Soft, non-tender, non-distended Extremity:2+ BL LE edema   Assessment/Plan:  Active Problems:   Acute on chronic heart failure (HCC)   Pressure injury of skin   Acute on chronic congestive heart failure (HCC)   Anasarca This is a 58 year old male with a history of half-pack, mechanical AVR status post Bentall procedure 1988, severe mitral regurgitation status postmechanicalvalve in May 2016, CAD status post SVG-RCA, chronic A. fib, ascending aortic dissection, diabetes mellitus, hypertension, OSA on CPAP, PVD, testicular cancer status post medication treatment and surgical resection who presented with worsening lower extremity edema. Noted to have an acute on chronic heart failure.  Acute on chronic heart failurewith preserved EFexacebation: Likely due to dietary indiscretion. Getting IV lasix 120 mg BID, metolazone has been discontinued. Patient has allergy to adhesive and he removed the adhesive over the PICC line yesterday, so last night lasix dose not given. He had an input of 1.2L, output 2.8 L, net negative 1.5 L. Weight is similar to yesterday at 199 lbs. Still appears fluid overloaded on exam.Cr stable at  0.99, K better today at 3.6. Working on replacing PICC line, IV tried to place yesterday but patient would prefer to get it with IR.   -Cardiology following, appreciate recs -Continue lasix 120 mg BID -IR for PICC line placement -Strict I+Os -Daily weights -Daily BMP -PT/OT > HH PT -Home health orders placed -Appreciate CSW assistance with this  NSVT: Electrolyte abnormalities: -K improved to 3.6 today. He received 3 doses of K-dur 40, and 5 runs of IV potassium. His metolazone has been stopped.  -BMP in AM  MicrocyticAnemia:  Hgbwas stable, no evidence of bleeding.   -CBC in AM  Afib Mechanical AVR and MVR: Rate controlled, on metoprolol 25 mg BID. Continued warfarin, added ASA while here.  -Continuemetoprolol25 mg BID -Continue ASA 81 mg daily -Continue home warfarin, per pharmacy -Continue to hold digoxin, discontinue on discharge  Hypervolemic hyponatremia: Due to volume overload. Na 126 on admission,improved to 130.Na 131 today.  -Continue diuresis  Chronic pain syndrome: -Continue oxycodone, gabapentin and robaxin. Patient can take all together.  FEN:No fluids, replete lytes prn,HHdiet VTE ppx:Warfarin Code Status: FULL    Dispo: Anticipated discharge in approximately 2-3 day(s).   [name], MD 02/24/2019, 6:43 AM Pager: [phone]

## 2019-02-24 NOTE — Care Management Important Message (Signed)
Important Message  Patient Details  Name: Lance Cook MRN: [id number] Date of Birth: November 30, 1960   Medicare Important Message Given:  Yes     [name] 02/24/2019, 1:20 PM

## 2019-02-25 LAB — PROTIME-INR
INR: 2.4 — ABNORMAL HIGH (ref 0.8–1.2)
Prothrombin Time: 25.5 seconds — ABNORMAL HIGH (ref 11.4–15.2)

## 2019-02-25 LAB — BASIC METABOLIC PANEL
Anion gap: 8 (ref 5–15)
BUN: 28 mg/dL — ABNORMAL HIGH (ref 6–20)
CO2: 35 mmol/L — ABNORMAL HIGH (ref 22–32)
Calcium: 7.9 mg/dL — ABNORMAL LOW (ref 8.9–10.3)
Chloride: 87 mmol/L — ABNORMAL LOW (ref 98–111)
Creatinine, Ser: 1 mg/dL (ref 0.61–1.24)
GFR calc Af Amer: >60 mL/min (ref >60)
GFR calc non Af Amer: >60 mL/min (ref >60)
Glucose, Bld: 73 mg/dL (ref 70–99)
Potassium: 3.9 mmol/L (ref 3.5–5.1)
Sodium: 130 mmol/L — ABNORMAL LOW (ref 135–145)

## 2019-02-25 LAB — GLUCOSE, CAPILLARY
Glucose-Capillary: 65 mg/dL — ABNORMAL LOW (ref 70–99)
Glucose-Capillary: 78 mg/dL (ref 70–99)
Glucose-Capillary: 76 mg/dL (ref 70–99)
Glucose-Capillary: 82 mg/dL (ref 70–99)
Glucose-Capillary: 79 mg/dL (ref 70–99)

## 2019-02-25 MED ORDER — METOPROLOL SUCCINATE ER 25 MG PO TB24
37.5000 mg | ORAL_TABLET | Freq: Two times a day (BID) | ORAL | Status: DC
  Administered 2019-02-25 – 2019-02-27 (×4): 37.5 mg via ORAL
  Filled 2019-02-25 (×4): qty 2

## 2019-02-25 MED ORDER — WARFARIN SODIUM 7.5 MG PO TABS
7.5000 mg | ORAL_TABLET | Freq: Once | ORAL | Status: AC
  Administered 2019-02-25: 7.5 mg via ORAL
  Filled 2019-02-25: qty 1

## 2019-02-25 MED ORDER — DIPHENHYDRAMINE HCL 25 MG PO CAPS
25.0000 mg | ORAL_CAPSULE | Freq: Every day | ORAL | Status: DC | PRN
  Administered 2019-02-25 – 2019-02-26 (×2): 25 mg via ORAL
  Filled 2019-02-25 (×2): qty 1

## 2019-02-25 NOTE — Plan of Care (Signed)
  Problem: Activity: Goal: Capacity to carry out activities will improve Outcome: Progressing   Problem: Cardiac: Goal: Ability to achieve and maintain adequate cardiopulmonary perfusion will improve Outcome: Progressing   Problem: Education: Goal: Ability to demonstrate management of disease process will improve Outcome: Progressing   

## 2019-02-25 NOTE — Progress Notes (Signed)
Pt complains of burning sensation when urinating.

## 2019-02-25 NOTE — Progress Notes (Signed)
Pt had 15 beat run of vtach. Pt asymptomatic. States he feels fine. MD notified. No response.

## 2019-02-25 NOTE — Progress Notes (Signed)
Physical Therapy Treatment Patient Details Name: Lance Cook MRN: [id number] DOB: [birth year] Today's Date: 02/25/2019    History of Present Illness 58 yo admitted with HF exacerbation. PMHx: CHF, AVR, MVR, CAD, AFib, AA dissection, DM, HTN, PVD, testicular CA, CVA at 58yo    PT Comments    Pt eager to participate in therapy this session having already positioned himself EOB and obtained back gown for walking. PT with significantly increased gait distance this session but deferred HEP after gait due to fatigue from increased distance. Pt determined to return home and wanting to do everything he can to assure D/C. Pt with SpO2 98% on RA with gait with HR 72-74    Follow Up Recommendations  Home health PT     Equipment Recommendations  None recommended by PT    Recommendations for Other Services       Precautions / Restrictions Precautions Precautions: Fall    Mobility  Bed Mobility Overal bed mobility: Modified Independent Bed Mobility: Sit to Supine       Sit to supine: Modified independent (Device/Increase time)   General bed mobility comments: EOB on arrival with momentum to swing legs back onto surface end of session.  Transfers Overall transfer level: Modified independent               General transfer comment: pt stood from bed with UE assist  Ambulation/Gait Ambulation/Gait assistance: Min guard Gait Distance (Feet): 400 Feet Assistive device: Rolling walker (2 wheeled) Gait Pattern/deviations: Step-through pattern;Decreased stride length;Wide base of support   Gait velocity interpretation: >2.62 ft/sec, indicative of community ambulatory General Gait Details: pt with increased sway with gait due to bil LE edema and body habitus, cues for proximity to RW. Pt with increased gait tolerance this session with maintained reliance on rW. cues for posture and proximity to rW with 2 standing rests briefly during gait   Stairs             Wheelchair  Mobility    Modified Rankin (Stroke Patients Only)       Balance Overall balance assessment: Needs assistance   Sitting balance-Leahy Scale: Good       Standing balance-Leahy Scale: Fair Standing balance comment: pt able to stand without UE assist but requested use of Rw for gait                            Cognition Arousal/Alertness: Awake/alert Behavior During Therapy: WFL for tasks assessed/performed Overall Cognitive Status: Within Functional Limits for tasks assessed                                        Exercises      General Comments        Pertinent Vitals/Pain Pain Assessment: No/denies pain    Home Living                      Prior Function            PT Goals (current goals can now be found in the care plan section) Progress towards PT goals: Progressing toward goals    Frequency           PT Plan Current plan remains appropriate    Co-evaluation              AM-PAC PT "6 Clicks" Mobility  Outcome Measure  Help needed turning from your back to your side while in a flat bed without using bedrails?: None Help needed moving from lying on your back to sitting on the side of a flat bed without using bedrails?: None Help needed moving to and from a bed to a chair (including a wheelchair)?: None Help needed standing up from a chair using your arms (e.g., wheelchair or bedside chair)?: None Help needed to walk in hospital room?: A Little Help needed climbing 3-5 steps with a railing? : A Little 6 Click Score: 22    End of Session   Activity Tolerance: Patient tolerated treatment well Patient left: in bed;with call bell/phone within reach Nurse Communication: Mobility status PT Visit Diagnosis: Muscle weakness (generalized) (M62.81);Other abnormalities of gait and mobility (R26.89)     Time: 1001-1020 PT Time Calculation (min) (ACUTE ONLY): 19 min  Charges:  $Gait Training: 8-22 mins                      [place], PT Acute Rehabilitation Services Pager: [phone] Office: [place] 02/25/2019, 1:29 PM

## 2019-02-25 NOTE — Progress Notes (Signed)
[place] for warfarin Indication: atrial fibrillation, mech AVR, mech MVR  Allergies  Allergen Reactions  . Penicillins Hives    Has patient had a PCN reaction causing immediate rash, facial/tongue/throat swelling, SOB or lightheadedness with hypotension: Yes Has patient had a PCN reaction causing severe rash involving mucus membranes or skin necrosis: Yes Has patient had a PCN reaction that required hospitalization unsure Has patient had a PCN reaction occurring within the last 10 years: 2010-2012 If all of the above answers are "NO", then may proceed with Cephalosporin use.  . Adhesive [Tape] Other (See Comments)    Skin irritation  . Bactrim [Sulfamethoxazole-Trimethoprim] Other (See Comments)    CDIFF, when taking oral tablets    Patient Measurements: Height: 5\' 1"  (154.9 cm) Weight: 198 lb 4.8 oz (89.9 kg) IBW/kg (Calculated) : 52.3 Heparin Dosing Weight: 75.6 kg  Vital Signs: Temp: 98.5 F (36.9 C) (06/26 0517) Temp Source: Oral (06/26 0517) BP: 110/62 (06/26 0903) Pulse Rate: 67 (06/26 0903)  Labs: Recent Labs    02/23/19 0506 02/24/19 0650 02/25/19 0457  HGB 8.6*  --   --   HCT 30.0*  --   --   PLT 188  --   --   LABPROT 26.3* 24.5* 25.5*  INR 2.5* 2.3* 2.4*  CREATININE 0.91 0.99 1.00    Estimated Creatinine Clearance: 77.6 mL/min (by C-G formula based on SCr of 1 mg/dL).   Assessment:  58 yo male on chronic Coumadin for afib, mechanical MVR and mechanical AVR.  INR goal 2.5-3.5. -INR subtherapeutic at 2.4 (dose increased to 7.5mg  on 6/25)    PTA Coumadin dose 5 mg daily except 2.5 mg on Mondays.  Recently held dose 6/9 (PTA) for supratherapeutic INR of 4.7.  Goal of Therapy:  INR 2.5-3.5 Monitor platelets by anticoagulation protocol: Yes   Plan:  Warfarin 7.5 mg po x 1 tonight Daily INR  Hildred Laser, PharmD Clinical Pharmacist **Pharmacist phone directory can now be found on [email or web address] ([email or web address]).  Listed  under [place].

## 2019-02-25 NOTE — Progress Notes (Signed)
Pt places self on CPAP dream station at night. Pt is on auto titrate max 20 min 5 w/2Lpm bled into the system. Pt respiratory status is stable at this time. RT will continue to monitor.

## 2019-02-25 NOTE — Progress Notes (Signed)
Progress Note  Patient Name: Lance Cook Date of Encounter: 02/25/2019  Primary Cardiologist:   [name], MD   Subjective   Feels OK.  Leaving his IV in.  No acute pain other than back pain  Inpatient Medications    Scheduled Meds: . aspirin EC  81 mg Oral Daily  . gabapentin  200-400 mg Oral Q8H  . Gerhardt's butt cream   Topical BID  . methocarbamol  500 mg Oral Q8H  . metoprolol succinate  25 mg Oral BID  . mupirocin ointment  1 application Topical TID  . potassium chloride  40 mEq Oral TID  . sodium chloride flush  10-40 mL Intracatheter Q12H  . sodium chloride flush  10-40 mL Intracatheter Q12H  . traZODone  100 mg Oral QHS  . Warfarin - Pharmacist Dosing Inpatient   Does not apply q1800   Continuous Infusions: . sodium chloride Stopped (02/16/19 1638)  . furosemide Stopped (02/24/19 2038)   PRN Meds: sodium chloride, acetaminophen **OR** acetaminophen, barrier cream, diphenhydrAMINE, oxyCODONE, promethazine, senna-docusate, sodium chloride flush, sodium chloride flush, sodium chloride flush   Vital Signs    Vitals:   02/24/19 2250 02/24/19 2301 02/25/19 0517 02/25/19 0545  BP: (!) 98/57  104/71   Pulse: 62 67 67   Resp:   20   Temp:   98.5 F (36.9 C)   TempSrc:   Oral   SpO2:  100% 100%   Weight:    89.9 kg  Height:        Intake/Output Summary (Last 24 hours) at 02/25/2019 0901 Last data filed at 02/25/2019 0837 Gross per 24 hour  Intake 1302 ml  Output 2100 ml  Net -798 ml   Filed Weights   02/23/19 0319 02/24/19 0146 02/25/19 0545  Weight: 90.4 kg 90.4 kg 89.9 kg    Telemetry    Atrial fib.  NSVT  - Personally Reviewed  ECG    NA - Personally Reviewed  Physical Exam   GEN: No  acute distress.   Neck:  Positive  JVD Cardiac: Irregular RR, mechanical S1 and S2 with systolic murmur, no diastolic murmurs, rubs, or gallops.  Respiratory: Clear  to auscultation bilaterally. GI: Soft, nontender, non-distended, normal bowel sounds   MS:  Severe edema improved; No deformity. Neuro:   Nonfocal  Psych: Oriented and appropriate    Labs    Chemistry Recent Labs  Lab 02/23/19 0506 02/24/19 0650 02/25/19 0457  NA 129* 131* 130*  K 2.8* 3.6 3.9  CL 84* 87* 87*  CO2 36* 38* 35*  GLUCOSE 69* 77 73  BUN 32* 29* 28*  CREATININE 0.91 0.99 1.00  CALCIUM 7.7* 8.0* 7.9*  GFRNONAA >60 >60 >60  GFRAA >60 >60 >60  ANIONGAP 9 6 8      Hematology Recent Labs  Lab 02/21/19 0709 02/22/19 0552 02/23/19 0506  WBC 7.7 8.8 7.5  RBC 4.58 4.59 4.29  HGB 9.1* 9.2* 8.6*  HCT 32.1* 32.0* 30.0*  MCV 70.1* 69.7* 69.9*  MCH 19.9* 20.0* 20.0*  MCHC 28.3* 28.8* 28.7*  RDW 19.7* 19.9* 19.5*  PLT 209 220 188    Cardiac EnzymesNo results for input(s): TROPONINI in the last 168 hours. No results for input(s): TROPIPOC in the last 168 hours.   BNPNo results for input(s): BNP, PROBNP in the last 168 hours.   DDimer No results for input(s): DDIMER in the last 168 hours.   Radiology    [place] [facility]  Result Date: 02/23/2019 If  Site Rite image not attached, placement could not be confirmed due to current cardiac rhythm.   Cardiac Studies   Echo:    1. The left ventricle has normal systolic function, with an ejection fraction of 55-60%. The cavity size was normal. Left ventricular diastolic function could not be evaluated due to mitral valve replacement/repair. There is abnormal septal motion  consistent with left bundle branch block.  2. Mild hypokinesis of the left ventricular, entire septal wall.  3. The right ventricle has normal systolic function. The cavity was normal. There is no increase in right ventricular wall thickness. Right ventricular systolic pressure is severely elevated with an estimated pressure of 62.4 mmHg.  4. Left atrial size was severely dilated.  5. Right atrial size was severely dilated.  6. A 33 St. Jude mechanical valve is present in the mitral position. Procedure Date: 01/24/2015 There is no  Echo evidence of rocking of the mitral prosthesis. Echo findings are consistent with perivalvular leak the mitral prosthesis.  7. Mitral valve regurgitation mild perivalvular. Mitral valve gradient appropriate for prosthesis mitral valve stenosis.  8. The tricuspid valve is grossly normal. Tricuspid valve regurgitation is moderate.  9. A 48mm St. Jude mechanical prosthesis valve is present in the aortic position. Normal aortic valve prosthesis. Echo findings are consistent with Mean gradient ~30 mmHg of the aortic prosthesis. 10. The aortic arch is normal in size and structure. 11. There is dilatation of the ascending aorta measuring 38 mm. 12. Post Bentall 1988. 13. The inferior vena cava was dilated in size with <50% respiratory variability.  Patient Profile     58 y.o. male with acute on chronic diastolic heart failure admitted with volume overload.  Assessment & Plan    ACUTE ON CHRONIC DIASTOLIC HF:  Negative 17.9 liters.    Down another kilogram.   I think that he is approaching the max benefit of IV therapy.  I would likely discharge (when we do that) on Bumex instead of Lasix.  I would continue the Lasix IV today and possibly tomorrow.   HYPOKALEMIA:  Finally better.  This is off the Zaroxolyn.  Check BMET before discharge.   He will need to have frequent labs at home.  He essentially takes potassium when he feels he needs it plus his baseline.  I suspect that he will need at least 80 daily at discharge.    MECHANICAL AORTIC VALVE:  Normal function on echo.  Continue current therapy.   MECHANICAL MITRAL VALVE:    Essentially normal function in the mitral position.  Mild perivalvular leak   PERMANENT ATRIAL FIB:  On ASA and therapeutic warfarin per pharamcy.  Subtherapeutic for his situation.     NSVT:    NSVT last night.   I am going to try to increase the metoprolol in the odd dosing scheme of BID XL.  He has not tolerated lots of meds in the past and I am trying to ease him into a  higher dose of beta blocker.   For questions or updates, please contact [place] Please consult [email or web address] for contact info under Cardiology/STEMI.   Signed, [name], MD  02/25/2019, 9:01 AM

## 2019-02-25 NOTE — Telephone Encounter (Signed)
I tried to talk to the wife.  No answer on the answering machine and the email was not established.  However, I have since spoken with her husband several times and I think all of the concerns and questions have been answered.

## 2019-02-25 NOTE — Progress Notes (Signed)
Pt placed self on CPAP dream station for the night. Pt on auto titrate with settings of max 20 min 5 w/2 Lpm bled into the system. Pt respiratory status is stable at this time. RT will continue to monitor.

## 2019-02-25 NOTE — Progress Notes (Signed)
   Subjective:   Patient was seen sitting up in chair by bedside. He reports improvement in breathing. He stated that the iv site has been a bit itchy and therefore would like benadryl. With breakfast with him. States he has been urinating okay.   Objective:  Vital signs in last 24 hours: Vitals:   02/24/19 2250 02/24/19 2301 02/25/19 0517 02/25/19 0545  BP: (!) 98/57  104/71   Pulse: 62 67 67   Resp:   20   Temp:   98.5 F (36.9 C)   TempSrc:   Oral   SpO2:  100% 100%   Weight:    89.9 kg  Height:        General: Well appearing male, NAD, sitting in bed Cardiac: Irregularly irregular, systolic murmur Pulmonary: Minimal bibasilar crackles, normal work of breathing, on RA Abdomen: Soft, non-tender, non-distended Extremity:2+ BL LE edema    Assessment/Plan:  Active Problems:   Acute on chronic heart failure (HCC)   Pressure injury of skin   Acute on chronic congestive heart failure (HCC)   Anasarca  This is a 58 year old male with a history of half-pack, mechanical AVR status post Bentall procedure 1988, severe mitral regurgitation status postmechanicalvalve in May 2016, CAD status post SVG-RCA, chronic A. fib, ascending aortic dissection, diabetes mellitus, hypertension, OSA on CPAP, PVD, testicular cancer status post medication treatment and surgical resection who presented with worsening lower extremity edema. Noted to have an acute on chronic heart failure.  Acute on chronic heart failurewith preserved EFexacebation: Likely due to dietary indiscretion. Had Midline placed in left arm yesterday. Some itchiness around the area. Received 1 dose of IV lasix 120 mg yesterday, missed first dose due to loss of IV access. Intake of 1L. Output 2L, net output of 1L. Weight down to 198 lbs today, from 199 yesterday. Still appears volume overloaded on exam. Cr stable at 1.00, K today is 3.9. Less output yesterday, possibly due to lower dose of lasix. Continuing with diureses, back  to lasix 120 mg BID with new midline placed. .   -Cardiology following, appreciate recs, cont IV therapy, when discharging will plan on bumex instead of lasix.  -Cont lasix 120 mg BID -Add benadryl PRN -Strict I+Os -Daily weights -Daily BMP -PT/OT > HH PT -Home health orders placed -Appreciate CSW assistance with this  NSVT: Electrolyte abnormalities: -Kimproved to 3.9 today. Received IV and PO K yesterday. Will continue K-dur 40 mg TID for now, cont to monitor, still getting lasix. In regards to NSVT, cardiology is increasing metoprolol to BID XL. -BMP in AM  MicrocyticAnemia:  Hgbhas been stable, no evidence of bleeding.  -Occasional CBC  Afib Mechanical AVR and MVR: Ratecontrolled, on metoprolol 25 mg BID. Continued warfarin, added ASA while here.  -Continuemetoprolol25 mg BID -Continue ASA 81 mg daily -Continue home warfarin, per pharmacy -Continue to hold digoxin, discontinue on discharge  Hypervolemic hyponatremia: Due to volume overload. Na 126 on admission,improved to 130. Na 130 today.  -Continue diuresis  Chronic pain syndrome: -Continue oxycodone, gabapentin and robaxin. Patient can take all together.  Dispo: Anticipated discharge in approximately 2-3 day(s).   [name], MD 02/25/2019, 6:39 AM Pager: [phone]

## 2019-02-25 NOTE — Plan of Care (Signed)
  Problem: Education: Goal: Ability to demonstrate management of disease process will improve Outcome: Progressing   Problem: Education: Goal: Ability to verbalize understanding of medication therapies will improve Outcome: Progressing   Problem: Cardiac: Goal: Ability to achieve and maintain adequate cardiopulmonary perfusion will improve Outcome: Progressing   Problem: Pain Managment: Goal: General experience of comfort will improve Outcome: Progressing

## 2019-02-26 DIAGNOSIS — I4821 Permanent atrial fibrillation: Secondary | ICD-10-CM

## 2019-02-26 DIAGNOSIS — Z9111 Patient's noncompliance with dietary regimen: Secondary | ICD-10-CM

## 2019-02-26 LAB — BASIC METABOLIC PANEL
Anion gap: 8 (ref 5–15)
BUN: 32 mg/dL — ABNORMAL HIGH (ref 6–20)
CO2: 34 mmol/L — ABNORMAL HIGH (ref 22–32)
Calcium: 7.5 mg/dL — ABNORMAL LOW (ref 8.9–10.3)
Chloride: 90 mmol/L — ABNORMAL LOW (ref 98–111)
Creatinine, Ser: 0.93 mg/dL (ref 0.61–1.24)
GFR calc Af Amer: >60 mL/min (ref >60)
GFR calc non Af Amer: >60 mL/min (ref >60)
Glucose, Bld: 84 mg/dL (ref 70–99)
Potassium: 3.2 mmol/L — ABNORMAL LOW (ref 3.5–5.1)
Sodium: 132 mmol/L — ABNORMAL LOW (ref 135–145)

## 2019-02-26 LAB — GLUCOSE, CAPILLARY
Glucose-Capillary: 67 mg/dL — ABNORMAL LOW (ref 70–99)
Glucose-Capillary: 69 mg/dL — ABNORMAL LOW (ref 70–99)
Glucose-Capillary: 94 mg/dL (ref 70–99)
Glucose-Capillary: 77 mg/dL (ref 70–99)
Glucose-Capillary: 71 mg/dL (ref 70–99)
Glucose-Capillary: 87 mg/dL (ref 70–99)

## 2019-02-26 LAB — PROTIME-INR
INR: 2.7 — ABNORMAL HIGH (ref 0.8–1.2)
Prothrombin Time: 28.2 seconds — ABNORMAL HIGH (ref 11.4–15.2)

## 2019-02-26 MED ORDER — DIPHENHYDRAMINE HCL 25 MG PO CAPS
25.0000 mg | ORAL_CAPSULE | Freq: Once | ORAL | Status: AC
  Administered 2019-02-26: 25 mg via ORAL
  Filled 2019-02-26: qty 1

## 2019-02-26 MED ORDER — DIPHENHYDRAMINE HCL 12.5 MG/5ML PO ELIX
12.5000 mg | ORAL_SOLUTION | Freq: Every day | ORAL | Status: DC | PRN
  Administered 2019-02-27: 12.5 mg via ORAL
  Filled 2019-02-26 (×2): qty 5

## 2019-02-26 MED ORDER — BUMETANIDE 2 MG PO TABS
2.0000 mg | ORAL_TABLET | Freq: Two times a day (BID) | ORAL | Status: DC
  Administered 2019-02-26 – 2019-02-27 (×3): 2 mg via ORAL
  Filled 2019-02-26 (×4): qty 1

## 2019-02-26 MED ORDER — WARFARIN SODIUM 5 MG PO TABS
5.0000 mg | ORAL_TABLET | Freq: Once | ORAL | Status: AC
  Administered 2019-02-26: 5 mg via ORAL
  Filled 2019-02-26: qty 1

## 2019-02-26 NOTE — Progress Notes (Signed)
Internal Medicine Teaching Service Attending:   I saw and examined the patient. I reviewed the resident's note and I agree with the resident's findings and plan as documented in the resident's note.  Active Problems:   Acute on chronic heart failure (HCC)   Pressure injury of skin   Acute on chronic congestive heart failure ([place])   [facility] day #13 for this 58 year old man admitted with acute on chronic heart failure with preserved ejection fraction.  He has diuresed well during this hospitalization.  Greatly appreciate cardiology consultation.  I agree with the plan to transition to Bumex.  Potentially ready for discharge tomorrow.  Remainder of the plan per the resident's note.  Lalla Brothers, MD FACP

## 2019-02-26 NOTE — Progress Notes (Signed)
Orthopedic Tech Progress Note Patient Details:  Lance Cook 1961/04/09 [id number]  Ortho Devices Type of Ortho Device: Haematologist Ortho Device/Splint Location: Patient refused unna boot. Patient ask me to wash legs. Ortho Device/Splint Interventions: Application   Post Interventions Patient Tolerated: Well Instructions Provided: Care of device   [name] 02/26/2019, 6:22 PM

## 2019-02-26 NOTE — Progress Notes (Signed)
   Subjective:   Lance Cook mentions his breathing is better but he asks for oxygen through mask because feels he is more comfortable with that. He was able to ambulate in the hall yesterday and felt ok with that. No major chest tightness.  He asks when he can go home. He feels stronger today. He says Benadryl worked Chief Technology Officer for him last night.  Objective:  Vital signs in last 24 hours: Vitals:   02/25/19 2056 02/25/19 2200 02/26/19 0438 02/26/19 0950  BP: (!) 182/119 (!) 100/53 (!) 102/57 104/74  Pulse: 83 64 (!) 50 62  Resp: 18  18   Temp: 98.3 F (36.8 C)  97.7 F (36.5 C)   TempSrc: Oral  Oral   SpO2: 100%  100% 97%  Weight:   90 kg   Height:       Physical Exam  Constitutional: He is oriented to person, place, and time. He appears well-developed and well-nourished. No distress.  HENT:  Head: Normocephalic and atraumatic.  Eyes: Conjunctivae are normal.  Cardiovascular: Normal rate, regular rhythm and normal heart sounds.  Respiratory: Effort normal. No respiratory distress. He has no wheezes.  Mild amount of rhonchi in lower lung fields  GI: Soft. Bowel sounds are normal. He exhibits no distension. There is no abdominal tenderness.  Musculoskeletal:        General: Edema (bilateral lower extremity) present.  Neurological: He is alert and oriented to person, place, and time.  Skin: He is not diaphoretic.  Psychiatric: He has a normal mood and affect. His behavior is normal. Judgment and thought content normal.    Assessment/Plan:  Acute on chronic heart failurewith preserved EFexacebation 2/2 dietary indiscretion Patient's volume status appears to have improved. Due to creatinine and bicab bump was decided to transition patient off IV lasix 120mg  bid to bumex 2mg  bid.   Cardiology following transitioned to bumex would like to monitor on po diuresis and then discharge.  -IV access with Midline placed 02/24/19 -I/O put out -3.2 urine out yesterday, weight stable 198.5  from 198.3, net since admission 25.1L -continue metoprolol 38.5mg  bid -BMP pending  -Monitor I/O with oral diuretic regimen today prior to discharge -Add benadryl PRN  NSVT Rates have ranged 50-80s  -continue metoprolol succinate 37.5mg  bid   Hypokalemia  Goal K=4, Mg=2  MicrocyticAnemia Stable  Permanent Afib Mechanical AVR and MVR Ratecontrolled and NSR.  -Continued warfarin -continue aspirin 81mg  qd -continue toprol XL   Hypervolemic hyponatremia Na 126 on admission, pending bmp today 6/27. Managing hyponatremia with diuresis.  Chronic pain syndrome -Continue oxycodone, gabapentin and robaxin. Patient can take all together.   Dispo: Anticipated discharge in approximately 1 day with home health PT/OT.   Lars Mage, MD 02/26/2019, 9:52 AM Pager: [phone]

## 2019-02-26 NOTE — Plan of Care (Signed)
  Problem: Clinical Measurements: Goal: Ability to maintain clinical measurements within normal limits will improve Outcome: Completed/Met Goal: Will remain free from infection Outcome: Completed/Met   Problem: Nutrition: Goal: Adequate nutrition will be maintained Outcome: Completed/Met   Problem: Coping: Goal: Level of anxiety will decrease Outcome: Completed/Met   Problem: Elimination: Goal: Will not experience complications related to bowel motility Outcome: Completed/Met Goal: Will not experience complications related to urinary retention Outcome: Completed/Met   Problem: Pain Managment: Goal: General experience of comfort will improve Outcome: Completed/Met   Problem: Safety: Goal: Ability to remain free from injury will improve Outcome: Completed/Met

## 2019-02-26 NOTE — Progress Notes (Signed)
Progress Note  Patient Name: Lance Cook Date of Encounter: 02/26/2019  Primary Cardiologist: [name], MD   Subjective   No complaints  Inpatient Medications    Scheduled Meds: . aspirin EC  81 mg Oral Daily  . gabapentin  200-400 mg Oral Q8H  . Gerhardt's butt cream   Topical BID  . methocarbamol  500 mg Oral Q8H  . metoprolol succinate  37.5 mg Oral BID  . mupirocin ointment  1 application Topical TID  . potassium chloride  40 mEq Oral TID  . sodium chloride flush  10-40 mL Intracatheter Q12H  . sodium chloride flush  10-40 mL Intracatheter Q12H  . traZODone  100 mg Oral QHS  . Warfarin - Pharmacist Dosing Inpatient   Does not apply q1800   Continuous Infusions: . sodium chloride Stopped (02/16/19 1638)  . furosemide Stopped (02/25/19 1913)   PRN Meds: sodium chloride, acetaminophen **OR** acetaminophen, barrier cream, diphenhydrAMINE, oxyCODONE, promethazine, senna-docusate, sodium chloride flush, sodium chloride flush, sodium chloride flush   Vital Signs    Vitals:   02/25/19 2051 02/25/19 2056 02/25/19 2200 02/26/19 0438  BP: 119/64 (!) 182/119 (!) 100/53 (!) 102/57  Pulse: (!) 59 83 64 (!) 50  Resp: 18 18  18   Temp: 98.2 F (36.8 C) 98.3 F (36.8 C)  97.7 F (36.5 C)  TempSrc: Oral Oral  Oral  SpO2: 100% 100%  100%  Weight:    90 kg  Height:        Intake/Output Summary (Last 24 hours) at 02/26/2019 0812 Last data filed at 02/26/2019 0542 Gross per 24 hour  Intake 1670.15 ml  Output 3200 ml  Net -1529.85 ml   Last 3 Weights 02/26/2019 02/25/2019 02/24/2019  Weight (lbs) 198 lb 8 oz 198 lb 4.8 oz 199 lb 3.2 oz  Weight (kg) 90.039 kg 89.948 kg 90.357 kg      Telemetry    Afib, 4 beat run NSVT - Personally Reviewed  ECG    n/a  Physical Exam   GEN: No acute distress.   Neck: elevated jvd Cardiac:irreg, 3/6 systolic murmur rusb and apex Respiratory: Clear to auscultation bilaterally. GI: Soft, nontender, non-distended  MS: No edema; No  deformity. Neuro:  Nonfocal  Psych: Normal affect   Labs    High Sensitivity Troponin:  No results for input(s): TROPONINIHS in the last 720 hours.    Cardiac EnzymesNo results for input(s): TROPONINI in the last 168 hours. No results for input(s): TROPIPOC in the last 168 hours.   Chemistry Recent Labs  Lab 02/23/19 0506 02/24/19 0650 02/25/19 0457  NA 129* 131* 130*  K 2.8* 3.6 3.9  CL 84* 87* 87*  CO2 36* 38* 35*  GLUCOSE 69* 77 73  BUN 32* 29* 28*  CREATININE 0.91 0.99 1.00  CALCIUM 7.7* 8.0* 7.9*  GFRNONAA >60 >60 >60  GFRAA >60 >60 >60  ANIONGAP 9 6 8      Hematology Recent Labs  Lab 02/21/19 0709 02/22/19 0552 02/23/19 0506  WBC 7.7 8.8 7.5  RBC 4.58 4.59 4.29  HGB 9.1* 9.2* 8.6*  HCT 32.1* 32.0* 30.0*  MCV 70.1* 69.7* 69.9*  MCH 19.9* 20.0* 20.0*  MCHC 28.3* 28.8* 28.7*  RDW 19.7* 19.9* 19.5*  PLT 209 220 188    BNPNo results for input(s): BNP, PROBNP in the last 168 hours.   DDimer No results for input(s): DDIMER in the last 168 hours.   Radiology    No results found.  Cardiac Studies  Patient Profile     58 y.o. male with acute on chronic diastolic heart failure admitted with volume overload.  Assessment & Plan    1. Acute on chronic diastolic HF - 12/8183 echo: LVEF 55-60%, cannot eval diastolic fxn due to MVR, elevated PASP 62 mmHg, severe biatrial enlargement - primary cardiology has recommended bumex as opposed to oral lasix at discharge.   - from charting neg 25.2 L this admission. He is on lasix 120mg  bid. Negative 1.5 L yesterday, labs pending today but over last few days slight uptrend in Cr.  - will d/c IV lasix, start torsemide 2mg  bid and follow I/Os, may need further titration, will be good to see response on oral regimen prior to discharge   2. History of mechanical MVR/ Mechanical AVR - 40mm St. Jude mechanical valve MVR and 25 mm St mechanical AVR  - 01/2019 echo the MVR with mild perivalvular leak, normal gradients.  The AVR mean grad 30 which is stable over the last few years - on coumadin and ASA  3. Hypokalemia - per hprimary team  4. Permanent afib - approprliately anticoagulated, rate controlled with toprol  5. NSVT - topro was increased. Just short 4 beat run overnight - Keep K at 4, Mg at 2   For questions or updates, please contact [place] Please consult [email or web address] for contact info under        Signed, Carlyle Dolly, MD  02/26/2019, 8:12 AM

## 2019-02-26 NOTE — Progress Notes (Signed)
Patient had a 7 beat run of VT-  MD aware.

## 2019-02-26 NOTE — Progress Notes (Signed)
[place] for warfarin Indication: atrial fibrillation, mech AVR, mech MVR  Allergies  Allergen Reactions  . Penicillins Hives    Has patient had a PCN reaction causing immediate rash, facial/tongue/throat swelling, SOB or lightheadedness with hypotension: Yes Has patient had a PCN reaction causing severe rash involving mucus membranes or skin necrosis: Yes Has patient had a PCN reaction that required hospitalization unsure Has patient had a PCN reaction occurring within the last 10 years: 2010-2012 If all of the above answers are "NO", then may proceed with Cephalosporin use.  . Adhesive [Tape] Other (See Comments)    Skin irritation  . Bactrim [Sulfamethoxazole-Trimethoprim] Other (See Comments)    CDIFF, when taking oral tablets    Patient Measurements: Height: 5\' 1"  (154.9 cm) Weight: 198 lb 8 oz (90 kg) IBW/kg (Calculated) : 52.3 Heparin Dosing Weight: 75.6 kg  Vital Signs: Temp: 97.5 F (36.4 C) (06/27 1159) Temp Source: Oral (06/27 1159) BP: 121/75 (06/27 1159) Pulse Rate: 57 (06/27 1159)  Labs: Recent Labs    02/24/19 0650 02/25/19 0457 02/26/19 0310  LABPROT 24.5* 25.5* 28.2*  INR 2.3* 2.4* 2.7*  CREATININE 0.99 1.00  --     Estimated Creatinine Clearance: 77.7 mL/min (by C-G formula based on SCr of 1 mg/dL).   Assessment: 58 yo male on chronic warfarin for afib, mechanical MVR and mechanical AVR.  INR goal 2.5-3.5. -INR therapeutic at 2.7 today (had 2 days of 7.5 mg)    PTA warfarin dose 5 mg daily except 2.5 mg on Mondays.  Recently held dose 6/9 (PTA) for supratherapeutic INR of 4.7.  Goal of Therapy:  INR 2.5-3.5 Monitor platelets by anticoagulation protocol: Yes   Plan:  Warfarin 5mg  po x 1 tonight Daily INR Monitor for s/sx of bleeding   Thank you for involving pharmacy in this patient's care.  Renold Genta, PharmD, BCPS Clinical Pharmacist Clinical phone for 02/26/2019 until 3p is [phone] 02/26/2019  12:35 PM  **Pharmacist phone directory can be found on [email or web address] listed under [place]**

## 2019-02-27 LAB — BASIC METABOLIC PANEL
Anion gap: 7 (ref 5–15)
BUN: 31 mg/dL — ABNORMAL HIGH (ref 6–20)
CO2: 35 mmol/L — ABNORMAL HIGH (ref 22–32)
Calcium: 7.9 mg/dL — ABNORMAL LOW (ref 8.9–10.3)
Chloride: 88 mmol/L — ABNORMAL LOW (ref 98–111)
Creatinine, Ser: 0.88 mg/dL (ref 0.61–1.24)
GFR calc Af Amer: >60 mL/min (ref >60)
GFR calc non Af Amer: >60 mL/min (ref >60)
Glucose, Bld: 83 mg/dL (ref 70–99)
Potassium: 4.1 mmol/L (ref 3.5–5.1)
Sodium: 130 mmol/L — ABNORMAL LOW (ref 135–145)

## 2019-02-27 LAB — GLUCOSE, CAPILLARY
Glucose-Capillary: 58 mg/dL — ABNORMAL LOW (ref 70–99)
Glucose-Capillary: 69 mg/dL — ABNORMAL LOW (ref 70–99)
Glucose-Capillary: 70 mg/dL (ref 70–99)
Glucose-Capillary: 78 mg/dL (ref 70–99)

## 2019-02-27 LAB — PROTIME-INR
INR: 3.3 — ABNORMAL HIGH (ref 0.8–1.2)
Prothrombin Time: 32.7 seconds — ABNORMAL HIGH (ref 11.4–15.2)

## 2019-02-27 MED ORDER — POTASSIUM CHLORIDE CRYS ER 20 MEQ PO TBCR: 40.0000 meq | EXTENDED_RELEASE_TABLET | Freq: Three times a day (TID) | ORAL | 0 refills | Status: DC

## 2019-02-27 MED ORDER — WARFARIN SODIUM 2.5 MG PO TABS
2.5000 mg | ORAL_TABLET | Freq: Once | ORAL | Status: AC
  Administered 2019-02-27: 2.5 mg via ORAL
  Filled 2019-02-27: qty 1

## 2019-02-27 MED ORDER — METOPROLOL SUCCINATE ER 25 MG PO TB24: 37.5000 mg | ORAL_TABLET | Freq: Two times a day (BID) | ORAL | 0 refills | Status: DC

## 2019-02-27 MED ORDER — ASPIRIN 81 MG PO TBEC: 81.0000 mg | DELAYED_RELEASE_TABLET | Freq: Every day | ORAL | 0 refills | Status: AC

## 2019-02-27 MED ORDER — BUMETANIDE 2 MG PO TABS: 2.0000 mg | ORAL_TABLET | Freq: Two times a day (BID) | ORAL | 0 refills | Status: DC

## 2019-02-27 MED ORDER — WARFARIN SODIUM 5 MG PO TABS: ORAL_TABLET | ORAL | 0 refills | Status: DC

## 2019-02-27 MED ORDER — WARFARIN SODIUM 2.5 MG PO TABS: 2.5000 mg | ORAL_TABLET | Freq: Once | ORAL | Status: DC

## 2019-02-27 NOTE — Progress Notes (Signed)
   Subjective:   Mr. Joos was seen laying in his bed this morning with his cpap mask on. He stated that he is ready to go home. He stated that his breathing is alright. Stated that he has been eating well. He feels like he is urinating well and denies any new issues.   Objective:  Vital signs in last 24 hours: Vitals:   02/26/19 0950 02/26/19 1159 02/26/19 1947 02/27/19 0508  BP: 104/74 121/75 116/71 112/62  Pulse: 62 (!) 57 60 (!) 58  Resp:  19 18 18   Temp:  (!) 97.5 F (36.4 C) 97.9 F (36.6 C) 97.7 F (36.5 C)  TempSrc:  Oral Oral Oral  SpO2: 97% 100% 93% 99%  Weight:      Height:        General: Middle aged male, NAD, laying in bed Cardiac: Irregularly irregular, systolic mumur with click Pulmonary: Minimal bibasilar crackles, normal work of breathin Abdomen: Soft, non-tender, non-distended Extremity: 1-2 + BL LE edema, up to mid thigh   Assessment/Plan:  Active Problems:   Acute on chronic heart failure (HCC)   Pressure injury of skin   Acute on chronic congestive heart failure (HCC)   Anasarca  This is a 58 year old male with a history of half-pack, mechanical AVR status post Bentall procedure 1988, severe mitralregurgitation status postmechanicalvalve in May 2016, CAD status post SVG-RCA, chronic A. fib, ascending aortic dissection, diabetes mellitus, hypertension, OSA on CPAP, PVD, testicular cancer status post medication treatment and surgical resection who presented with worsening lower extremity edema. Noted to have an acute on chronic heart failure.  Acute on chronic heart failure exacerbation: Likely due to dietary indiscretion. Switched to Bumex 2 mg BID yesterday. Input of 2L, output of 2.6 L. Weight down to 198 lbs, from 219 on admission. Cr is stable at 0.88. Cardiology following, feels that this is a good regimen barring a significant rise in Cr, okay to discharge from their stand point. Patient reports symptomatic improvement. He still has some fluid  overload however is much closer to his baseline.   -Continue Bumex 2 mg BID -CSW assisting with [facility] services -Advised patient to monitor weights and to limit fluid intake -Stable for discharge today, follow up with PCP and cardiology  Atrial fibrillation: Mechanical MVR, Mechanical AVR  -Continue metoprolol -Continue ASA -Continue coumadin, get 2.5 today, resume 5 mg daily tomorrow, follow up with coumadin clinic and PCP -Discontinue digoxin  NSVT: Hypokalemia: -K 4.1 today, now on K-dur 40 mg TID, continue this on discharge.  -4 beat run overnight, metoprolol sucicnate was increased to 37.5 mg BID.   Microcytic anemia: Stable, no evidence of bleeding. CBC on hospital follow up.   Hypervolemic hyponatremia Na 126 on admission, today 130. Continue to monitor, continuing diuresis on discharge  Chronic pain syndrome -Continue oxycodone, gabapentin and robaxin. Patient can take all together.  FEN: No fluids, replete lytes prn, HH diet  VTE ppx: Coumadin Code Status: FULL    Dispo: Anticipated discharge in approximately today.   [name], MD 02/27/2019, 6:41 AM Pager: [phone]

## 2019-02-27 NOTE — Progress Notes (Signed)
Progress Note  Patient Name: Lance Cook Date of Encounter: 02/27/2019  Primary Cardiologist: [name], MD   Subjective   No complaints  Inpatient Medications    Scheduled Meds: . aspirin EC  81 mg Oral Daily  . bumetanide  2 mg Oral BID  . gabapentin  200-400 mg Oral Q8H  . Gerhardt's butt cream   Topical BID  . methocarbamol  500 mg Oral Q8H  . metoprolol succinate  37.5 mg Oral BID  . mupirocin ointment  1 application Topical TID  . potassium chloride  40 mEq Oral TID  . sodium chloride flush  10-40 mL Intracatheter Q12H  . sodium chloride flush  10-40 mL Intracatheter Q12H  . traZODone  100 mg Oral QHS  . Warfarin - Pharmacist Dosing Inpatient   Does not apply q1800   Continuous Infusions: . sodium chloride Stopped (02/16/19 1638)   PRN Meds: sodium chloride, acetaminophen **OR** acetaminophen, barrier cream, diphenhydrAMINE, diphenhydrAMINE, oxyCODONE, promethazine, senna-docusate, sodium chloride flush, sodium chloride flush, sodium chloride flush   Vital Signs    Vitals:   02/26/19 0950 02/26/19 1159 02/26/19 1947 02/27/19 0508  BP: 104/74 121/75 116/71 112/62  Pulse: 62 (!) 57 60 (!) 58  Resp:  19 18 18   Temp:  (!) 97.5 F (36.4 C) 97.9 F (36.6 C) 97.7 F (36.5 C)  TempSrc:  Oral Oral Oral  SpO2: 97% 100% 93% 99%  Weight:      Height:        Intake/Output Summary (Last 24 hours) at 02/27/2019 0738 Last data filed at 02/27/2019 1610 Gross per 24 hour  Intake 2120 ml  Output 2800 ml  Net -680 ml   Last 3 Weights 02/26/2019 02/25/2019 02/24/2019  Weight (lbs) 198 lb 8 oz 198 lb 4.8 oz 199 lb 3.2 oz  Weight (kg) 90.039 kg 89.948 kg 90.357 kg      Telemetry    Rate controlled afib, short isolated run of NSVT 7 beats - Personally Reviewed  ECG    n/a - Personally Reviewed  Physical Exam   GEN: No acute distress.   Neck: elevated JVD Cardiac: irreg, 3/6 systolic murmur rusb Respiratory: Clear to auscultation bilaterally. GI: Soft,  nontender, non-distended  MS: No edema; No deformity. Neuro:  Nonfocal  Psych: Normal affect   Labs    High Sensitivity Troponin:  No results for input(s): TROPONINIHS in the last 720 hours.    Cardiac EnzymesNo results for input(s): TROPONINI in the last 168 hours. No results for input(s): TROPIPOC in the last 168 hours.   Chemistry Recent Labs  Lab 02/24/19 0650 02/25/19 0457 02/26/19 1133  NA 131* 130* 132*  K 3.6 3.9 3.2*  CL 87* 87* 90*  CO2 38* 35* 34*  GLUCOSE 77 73 84  BUN 29* 28* 32*  CREATININE 0.99 1.00 0.93  CALCIUM 8.0* 7.9* 7.5*  GFRNONAA >60 >60 >60  GFRAA >60 >60 >60  ANIONGAP 6 8 8      Hematology Recent Labs  Lab 02/21/19 0709 02/22/19 0552 02/23/19 0506  WBC 7.7 8.8 7.5  RBC 4.58 4.59 4.29  HGB 9.1* 9.2* 8.6*  HCT 32.1* 32.0* 30.0*  MCV 70.1* 69.7* 69.9*  MCH 19.9* 20.0* 20.0*  MCHC 28.3* 28.8* 28.7*  RDW 19.7* 19.9* 19.5*  PLT 209 220 188    BNPNo results for input(s): BNP, PROBNP in the last 168 hours.   DDimer No results for input(s): DDIMER in the last 168 hours.   Radiology    No  results found.  Cardiac Studies    Patient Profile     58 y.o.malewith acute on chronic diastolic heart failure admitted with volume overload.  Assessment & Plan    1. Acute on chronic diastolic HF - 12/8183 echo: LVEF 55-60%, cannot eval diastolic fxn due to MVR, elevated PASP 62 mmHg, severe biatrial enlargement - primary cardiology has recommended bumex as opposed to oral lasix at discharge.   - from charting neg 25.9 L this admission. He was on lasix 120mg  bid, changed to bumex oral 2mg  bid yesterday. On new regimen net negative 664mL. Labs pending,barring a significant Cr rise I think this is a good oral regimen for him.     2. History of mechanical MVR/ Mechanical AVR - 9mm St. Jude mechanical valve MVR and 25 mm St mechanical AVR  - 01/2019 echo the MVR with mild perivalvular leak, normal gradients. The AVR mean grad 30 which is  stable over the last few years - on coumadin and ASA  3. Hypokalemia - per hprimary team  4. Permanent afib - approprliately anticoagulated, rate controlled with toprol  5. NSVT - toprol was increased. Just short 4 beat run overnight - Keep K at 4, Mg at 2   Would be ok for discharge today from cardiac standpoint, we will arrange outpatient follow  For questions or updates, please contact [place] Please consult [email or web address] for contact info under        Signed, Carlyle Dolly, MD  02/27/2019, 7:38 AM

## 2019-02-27 NOTE — Care Management (Signed)
Notified [facility] that patient will DC today, they will provide [facility] services. No other CM needs identified.

## 2019-02-27 NOTE — Progress Notes (Signed)
[place] for warfarin Indication: atrial fibrillation, mech AVR, mech MVR  Allergies  Allergen Reactions  . Penicillins Hives    Has patient had a PCN reaction causing immediate rash, facial/tongue/throat swelling, SOB or lightheadedness with hypotension: Yes Has patient had a PCN reaction causing severe rash involving mucus membranes or skin necrosis: Yes Has patient had a PCN reaction that required hospitalization unsure Has patient had a PCN reaction occurring within the last 10 years: 2010-2012 If all of the above answers are "NO", then may proceed with Cephalosporin use.  . Adhesive [Tape] Other (See Comments)    Skin irritation  . Bactrim [Sulfamethoxazole-Trimethoprim] Other (See Comments)    CDIFF, when taking oral tablets    Patient Measurements: Height: 5\' 1"  (154.9 cm) Weight: 198 lb 8 oz (90 kg) IBW/kg (Calculated) : 52.3 Heparin Dosing Weight: 75.6 kg  Vital Signs: Temp: 97.7 F (36.5 C) (06/28 0508) Temp Source: Oral (06/28 0508) BP: 116/77 (06/28 0937) Pulse Rate: 76 (06/28 0937)  Labs: Recent Labs    02/25/19 0457 02/26/19 0310 02/26/19 1133 02/27/19 0406 02/27/19 0957  LABPROT 25.5* 28.2*  --  32.7*  --   INR 2.4* 2.7*  --  3.3*  --   CREATININE 1.00  --  0.93  --  0.88    Estimated Creatinine Clearance: 88.3 mL/min (by C-G formula based on SCr of 0.88 mg/dL).   Assessment: 58 yo male on chronic warfarin for afib, mechanical MVR and mechanical AVR.  INR goal 2.5-3.5. -INR therapeutic at 3.3 today, no bleeding noted    PTA warfarin dose 5 mg daily except 2.5 mg on Mondays.  Recently held dose 6/9 (PTA) for supratherapeutic INR of 4.7.  Goal of Therapy:  INR 2.5-3.5 Monitor platelets by anticoagulation protocol: Yes   Plan:  Warfarin 2.5 mg po x 1 tonight Daily INR Monitor for s/sx of bleeding   Thank you for involving pharmacy in this patient's care.  Renold Genta, PharmD, BCPS Clinical  Pharmacist Clinical phone for 02/27/2019 until 3p is [phone] 02/27/2019 10:47 AM  **Pharmacist phone directory can be found on [email or web address] listed under [place]**

## 2019-02-27 NOTE — Progress Notes (Signed)
Pt's CBG 58, OJ graham crackers and PB given, CBG 69, another OJ given, CBG 70, iwll continue to monitor, Thanks MIke F RN.

## 2019-02-27 NOTE — Discharge Instructions (Signed)
Lance Cook,   It has been a pleasure working with you and we are glad you're feeling better. You were hospitalized for heart failure.   Please make the following adjustments to your medications,  STOP taking digoxin, atenolol, lasix, and metolazone  START taking Aspirin 81 mg daily Bumex 2 mg twice a day Metoprolol 37.5 mg (1.5 tablets) twice a day K-Dur 40 mEq three times a day  In regards to your coumadin, please take 5 mg coumadin daily starting tomorrow, follow up with coumadin clinic within the week to recheck your INR to adjust as needed  Follow up with your primary care provider in 1-2 weeks  If your symptoms worsen or you develop new symptoms, please seek medical help whether it is your primary care provider or emergency department.   LOW SODIUM NUTRITION THERAPY  Eating less sodium can help you if you have high blood pressure, heart failure, or kidney or liver disease.   Your body needs a little sodium, but too much sodium can cause your body to hold onto extra water. This extra water will raise your blood pressure and can cause damage to your heart, kidneys, or liver as they are forced to work harder.   Sometimes you can see how the extra fluid affects you because your hands, legs, or belly swell. You may also hold water around your heart and lungs, which makes it hard to breathe.   Even if you take medication for blood pressure or a water pill (diuretic) to remove fluid, it is still important to have less salt in your diet.   Check with your primary care provider before drinking alcohol since it may affect the amount of fluid in your body and how your heart, kidneys, or liverwork.  Sodium in Food A low-sodium meal plan limits the sodium that you get from food and beverages to1,500-2,000 milligrams (mg) per day. Salt is the main source of sodium. Read the nutrition label on the package to find out how much sodium is in one serving of a food.  Select foods with 140  milligrams (mg) of sodium or less per serving.  You may be able to eat one or two servings of foods with a little more than 140 milligrams (mg) of sodium if you are closely watching how much sodium you eat in a day.  Check the serving size on the label. The amount of sodium listed on the label shows the amount in one serving of the food. So, if you eat more than one serving, you will get more sodium than the amount listed.  TIPS  Cutting Back on Sodium  Eat more fresh foods.  ? Fresh fruits and vegetables are low in sodium, as well as frozen vegetables and fruits that have no added juices or sauces. ? Fresh meats are lower in sodium than processed meats, such as bacon, sausage, and hotdogs.  Not all processed foods are unhealthy, but some processed foods may have too much sodium.  Eat less salt at the table and when cooking. One of the ingredients in salt is sodium.  ? One teaspoon of table salt has 2,300 milligrams of sodium. ? Leave the salt out of recipes for pasta, casseroles, and soups.  Be a Paramedic. ? Food packages that say Salt-free, sodium-free, very low sodium, and low sodium have less than 140 milligrams of sodium per serving. ? Beware of products identified as Unsalted, No Salt Added, Reduced Sodium, or Lower Sodium. These items may still be  high in sodium. You should always check the nutrition label.  Add flavors to your food without adding sodium. ? Try lemon juice, lime juice, or vinegar. ? Dry or fresh herbs add flavor. ? Buy a sodium-free seasoning blend or make your own at home.  You can purchase salt-free or sodium-free condiments like barbeque sauce in stores and online. Ask your registered dietitian nutritionist for recommendations and where to find them.  Eating in Restaurants Choose foods carefully when you eat outside your home. Restaurant foods can be very high in sodium. Many restaurants provide nutrition facts on their menus or their  websites. If you cannot find that information, ask your server. Let your server know that you want your food to be cooked without salt and that you would like your salad dressing and sauces to be served on the side.   Food Group Foods Recommended  Grains Bread, bagels, rolls without salted tops Homemade bread made with reduced-sodium baking powder Cold cereals, especially shredded wheat and puffed rice Oats, grits, or cream of wheat Pastas, quinoa, and rice Popcorn, pretzels or crackers without salt Corn tortillas  Protein Foods Fresh meats and fish; [place] bacon (check the nutrition labels - make sure they are not packaged in a sodium solution) Canned or packed tuna (no more than 4 ounces at 1 serving) Beans and peas Soybeans) and tofu Eggs Nuts or nut butters without salt  Dairy Milk or milk powder Plant milks, such as rice and soy Yogurt, including Greek yogurt Small amounts of natural cheese (blocks of cheese) or reduced-sodium cheese can be used in moderation. (Swiss, ricotta, and fresh mozzarella cheese are lower in sodium than the others) Cream Cheese Low sodium cottage cheese  Vegetables Fresh and frozen vegetables without added sauces or salt Homemade soups (without salt) Low-sodium, salt-free or sodium-free canned vegetables and soups  Fruit Fresh and canned fruits Dried fruits, such as raisins, cranberries, and prunes  Oils Tub or liquid margarine, regular or without salt Canola, corn, peanut, olive, safflower, or sunflower oils  Condiments Fresh or dried herbssuch as basil, bay leaf, dill, mustard (dry), nutmeg, paprika, parsley, rosemary, sage, or thyme.  Low sodium ketchup Vinegar Lemon or lime juice Pepper, red pepper flakes, and cayenne. Hot sauce contains sodium, but if you use just a drop or two, it will not add up to much.  Salt-free or sodium-free seasoning mixes and marinades Simple salad dressings:  vinegar and oil     Food Group Foods Not Recommended  Grains Breads or crackers topped with salt Cereals (hot/cold) with more than 300 mg sodium per serving Biscuits, cornbread, and other quick breads prepared with baking soda Pre-packaged bread crumbs Seasoned and packaged rice and pasta mixes Self-rising flours  Protein Foods Cured meats: Bacon, ham, sausage, pepperoni and hot dogs Canned meats (chili, vienna sausage, or sardines) Smoked fish and meats Frozen meals that have more than 600 mg of sodium per serving Egg substitute (with added sodium)  Dairy Buttermilk Processed cheese spreads Cottage cheese (1 cup may have over 500 mg of sodium; look for low-sodium.) American or feta cheese Shredded Cheese has more sodium than blocks of cheese String cheese  Vegetables Canned vegetables (unless they are salt-free, sodium-free or low sodium) Frozen vegetables with seasoning and sauces Sauerkraut and pickled vegetables Canned or dried soups (unless they are salt-free, sodium-free, or low sodium) [place] fries and onion rings  Fruit Dried fruits preserved with additives that have sodium  Oils Salted butter or margarine, all types of  olives  Condiments Salt, sea salt, kosher salt, onion salt, and garlic salt Seasoning mixes with salt Bouillon cubes Ketchup Barbeque sauce and Worcestershire sauce unless low sodium Soy sauce Salsa, pickles, olives, relish Salad dressings: ranch, blue cheese, [place], and [place].    Low Sodium Sample 1-Day Menu Breakfast 1 cup cooked oatmeal  1 slice whole wheat bread toast 1 tablespoon peanut butter without salt  1 banana 1 cup 1% milk  Lunch Tacos made with: 2 corn tortillas   cup black beans, low sodium  cup roasted or grilled chicken (without skin)   avocado Squeeze of lime juice  1 cup salad greens  1 tablespoon low-sodium salad dressing   cup strawberries 1 orange  Afternoon Snack 1/3 cup grapes  6 ounces yogurt  Evening  Meal 3 ounces herb-baked fish  1 baked potato 2 teaspoons olive oil   cup cooked carrots 2 thick slices tomatoes on: 2 lettuce leaves 1 teaspoon olive oil 1 teaspoon balsamic vinegar 1 cup 1% milk  Evening Snack 1 apple   cup almonds without salt

## 2019-03-02 ENCOUNTER — Ambulatory Visit (INDEPENDENT_AMBULATORY_CARE_PROVIDER_SITE_OTHER): Payer: Medicare HMO | Admitting: Internal Medicine

## 2019-03-02 DIAGNOSIS — Z954 Presence of other heart-valve replacement: Secondary | ICD-10-CM

## 2019-03-02 DIAGNOSIS — Z5181 Encounter for therapeutic drug level monitoring: Secondary | ICD-10-CM | POA: Diagnosis not present

## 2019-03-02 DIAGNOSIS — I482 Chronic atrial fibrillation, unspecified: Secondary | ICD-10-CM

## 2019-03-02 LAB — POCT INR: INR: 2 (ref 2.0–3.0)

## 2019-03-02 NOTE — Patient Instructions (Signed)
Description   Spoke with Amy from [facility] - advised pt to take 7.5mg  today and tomorrow, then continue taking 1 tablet all other days except 1/2 tablet on Mondays. Recheck INR in 1 week.

## 2019-03-03 ENCOUNTER — Telehealth: Payer: Self-pay | Admitting: *Deleted

## 2019-03-03 ENCOUNTER — Telehealth: Payer: Self-pay | Admitting: Cardiology

## 2019-03-03 NOTE — Telephone Encounter (Addendum)
Information was sent through [place] for [facility] .  On miAwaitng decision within 24-72 hours. Sander Nephew, RN 03/03/2019 2:28 PM. Fax from [facility] for further information.  Spoke with representative there who said that the patient's pharmacy need to be contacted about the number of tablets that was requested.  Call to [facility] message was left to call the clinics concerning the prescription.  Sander Nephew, RN 03/07/2019 11:12 AM.  Follow up with Pharmacy.  Patient was able to have 45 tablets delivered to his home.  Sander Nephew, RN 03/09/2019 9:32 AM.

## 2019-03-03 NOTE — Telephone Encounter (Signed)
Unable to reach pt or leave a message  

## 2019-03-03 NOTE — Telephone Encounter (Signed)
New Message    Pt c/o swelling: STAT is pt has developed SOB within 24 hours  1) How much weight have you gained and in what time span? 9-11 pounds in the past two days.  2) If swelling, where is the swelling located? Groin and lower legs  3) Are you currently taking a fluid pill? Yes   4) Are you currently SOB? No  5) Do you have a log of your daily weights (if so, list)? 201, 204, 209,   6) Have you gained 3 pounds in a day or 5 pounds in a week? Yes  7) Have you traveled recently? No

## 2019-03-07 NOTE — Telephone Encounter (Signed)
Tried to call pt VM full unable to [facility]

## 2019-03-08 NOTE — Telephone Encounter (Signed)
Attempted to contact patient to discuss, no answer. Unable to leave message.  Will remove from triage pool as they have been called multiple times with no answer.

## 2019-03-09 ENCOUNTER — Ambulatory Visit (INDEPENDENT_AMBULATORY_CARE_PROVIDER_SITE_OTHER): Payer: Medicare HMO | Admitting: Internal Medicine

## 2019-03-09 ENCOUNTER — Encounter: Payer: Self-pay | Admitting: Physician Assistant

## 2019-03-09 DIAGNOSIS — Z5181 Encounter for therapeutic drug level monitoring: Secondary | ICD-10-CM | POA: Diagnosis not present

## 2019-03-09 DIAGNOSIS — Z954 Presence of other heart-valve replacement: Secondary | ICD-10-CM | POA: Diagnosis not present

## 2019-03-09 DIAGNOSIS — I482 Chronic atrial fibrillation, unspecified: Secondary | ICD-10-CM | POA: Diagnosis not present

## 2019-03-09 LAB — POCT INR: INR: 2.4 (ref 2.0–3.0)

## 2019-03-09 NOTE — Patient Instructions (Addendum)
Description   Spoke with Amy LPN from [facility] - advised pt to take 7.5mg  today then continue taking 1 tablet all other days except 1/2 tablet on Mondays. Recheck INR in 1 week.

## 2019-03-16 ENCOUNTER — Ambulatory Visit (INDEPENDENT_AMBULATORY_CARE_PROVIDER_SITE_OTHER): Payer: Medicare HMO | Admitting: Cardiovascular Disease

## 2019-03-16 DIAGNOSIS — Z5181 Encounter for therapeutic drug level monitoring: Secondary | ICD-10-CM

## 2019-03-16 DIAGNOSIS — Z954 Presence of other heart-valve replacement: Secondary | ICD-10-CM | POA: Diagnosis not present

## 2019-03-16 DIAGNOSIS — I482 Chronic atrial fibrillation, unspecified: Secondary | ICD-10-CM | POA: Diagnosis not present

## 2019-03-16 LAB — POCT INR: INR: 2.2 (ref 2.0–3.0)

## 2019-03-16 NOTE — Patient Instructions (Signed)
Description   Spoke with Amy LPN from [facility] pt to take 7.5mg  today then start taking 1 tablet daily. Recheck INR in 1 week. Coumadin Clinic [phone]

## 2019-03-23 ENCOUNTER — Ambulatory Visit (INDEPENDENT_AMBULATORY_CARE_PROVIDER_SITE_OTHER): Payer: Medicare HMO | Admitting: Interventional Cardiology

## 2019-03-23 DIAGNOSIS — Z954 Presence of other heart-valve replacement: Secondary | ICD-10-CM | POA: Diagnosis not present

## 2019-03-23 DIAGNOSIS — I482 Chronic atrial fibrillation, unspecified: Secondary | ICD-10-CM | POA: Diagnosis not present

## 2019-03-23 DIAGNOSIS — Z5181 Encounter for therapeutic drug level monitoring: Secondary | ICD-10-CM

## 2019-03-23 LAB — POCT INR: INR: 2.6 (ref 2.0–3.0)

## 2019-03-23 NOTE — Patient Instructions (Signed)
Description   Spoke with Amy LPN from [facility] pt to continue taking 1 tablet daily. Recheck INR in 1 week. Coumadin Clinic [phone]

## 2019-03-30 ENCOUNTER — Telehealth: Payer: Self-pay | Admitting: Cardiology

## 2019-03-30 ENCOUNTER — Ambulatory Visit (INDEPENDENT_AMBULATORY_CARE_PROVIDER_SITE_OTHER): Payer: Medicare HMO | Admitting: Internal Medicine

## 2019-03-30 ENCOUNTER — Other Ambulatory Visit: Payer: Self-pay | Admitting: Internal Medicine

## 2019-03-30 DIAGNOSIS — Z954 Presence of other heart-valve replacement: Secondary | ICD-10-CM

## 2019-03-30 DIAGNOSIS — Z5181 Encounter for therapeutic drug level monitoring: Secondary | ICD-10-CM

## 2019-03-30 DIAGNOSIS — I482 Chronic atrial fibrillation, unspecified: Secondary | ICD-10-CM

## 2019-03-30 LAB — POCT INR: INR: 3.4 — AB (ref 2.0–3.0)

## 2019-03-30 NOTE — Telephone Encounter (Signed)
Dr.  Melina Modena of [facility] was calling to office to speak with Dr. [name] about the patient.

## 2019-03-30 NOTE — Telephone Encounter (Signed)
I spoke to Dr. Arline Asp regarding Lance Cook in my role as office DOD. He would like to make Dr. [name] aware that despite uptitration of diuretics with maximizing bumex and metolazone, the patient continues to have refractory edema with increasing anasarca. IV lasix was given yesterday through the office, but the patient seems to be doing poorly from a management of outpatient diuretics standpoint. Dr. Arline Asp would like for the patient to be seen in cardiology, which I feel is appropriate and can be arranged this week. We also discussed involving the expertise of Dr. Glori Bickers in AHF at Dr. Milagros Evener request, and he will reach out to Dr. Haroldine Laws to discuss and coordinate further. I have offered to coordinate an appointment at [place] office if needed and Dr. Arline Asp will let us know if he requires that. No significant SOB.   Would be appropriate to have patient seen in office as needed, or admitted to hospital if symptoms progress.

## 2019-03-30 NOTE — Patient Instructions (Signed)
Description   Spoke with Amy LPN from [facility] pt to continue taking 1 tablet daily. Recheck INR in 2 weeks. Coumadin Clinic [phone]

## 2019-03-31 NOTE — Telephone Encounter (Signed)
Has appt for next wek

## 2019-04-01 ENCOUNTER — Emergency Department (HOSPITAL_COMMUNITY): Payer: Medicare HMO

## 2019-04-01 ENCOUNTER — Telehealth: Payer: Self-pay | Admitting: Physician Assistant

## 2019-04-01 ENCOUNTER — Inpatient Hospital Stay (HOSPITAL_COMMUNITY)
Admission: EM | Admit: 2019-04-01 | Discharge: 2019-04-11 | DRG: 286 | Disposition: A | Discharge status: Home Health | Payer: Medicare HMO | Attending: Cardiology | Admitting: Cardiology

## 2019-04-01 ENCOUNTER — Other Ambulatory Visit: Payer: Self-pay

## 2019-04-01 DIAGNOSIS — F419 Anxiety disorder, unspecified: Secondary | ICD-10-CM | POA: Diagnosis present

## 2019-04-01 DIAGNOSIS — E871 Hypo-osmolality and hyponatremia: Secondary | ICD-10-CM | POA: Diagnosis present

## 2019-04-01 DIAGNOSIS — I252 Old myocardial infarction: Secondary | ICD-10-CM | POA: Diagnosis not present

## 2019-04-01 DIAGNOSIS — Z952 Presence of prosthetic heart valve: Secondary | ICD-10-CM

## 2019-04-01 DIAGNOSIS — I110 Hypertensive heart disease with heart failure: Principal | ICD-10-CM | POA: Diagnosis present

## 2019-04-01 DIAGNOSIS — Z79899 Other long term (current) drug therapy: Secondary | ICD-10-CM

## 2019-04-01 DIAGNOSIS — L97909 Non-pressure chronic ulcer of unspecified part of unspecified lower leg with unspecified severity: Secondary | ICD-10-CM | POA: Diagnosis present

## 2019-04-01 DIAGNOSIS — I509 Heart failure, unspecified: Secondary | ICD-10-CM

## 2019-04-01 DIAGNOSIS — Z20828 Contact with and (suspected) exposure to other viral communicable diseases: Secondary | ICD-10-CM | POA: Diagnosis present

## 2019-04-01 DIAGNOSIS — R609 Edema, unspecified: Secondary | ICD-10-CM | POA: Diagnosis present

## 2019-04-01 DIAGNOSIS — I482 Chronic atrial fibrillation, unspecified: Secondary | ICD-10-CM | POA: Diagnosis present

## 2019-04-01 DIAGNOSIS — I5033 Acute on chronic diastolic (congestive) heart failure: Secondary | ICD-10-CM | POA: Diagnosis present

## 2019-04-01 DIAGNOSIS — I25810 Atherosclerosis of coronary artery bypass graft(s) without angina pectoris: Secondary | ICD-10-CM | POA: Diagnosis present

## 2019-04-01 DIAGNOSIS — Y92239 Unspecified place in hospital as the place of occurrence of the external cause: Secondary | ICD-10-CM | POA: Diagnosis not present

## 2019-04-01 DIAGNOSIS — E785 Hyperlipidemia, unspecified: Secondary | ICD-10-CM | POA: Diagnosis present

## 2019-04-01 DIAGNOSIS — Z7982 Long term (current) use of aspirin: Secondary | ICD-10-CM

## 2019-04-01 DIAGNOSIS — Z833 Family history of diabetes mellitus: Secondary | ICD-10-CM

## 2019-04-01 DIAGNOSIS — I10 Essential (primary) hypertension: Secondary | ICD-10-CM | POA: Diagnosis present

## 2019-04-01 DIAGNOSIS — I5032 Chronic diastolic (congestive) heart failure: Secondary | ICD-10-CM | POA: Diagnosis present

## 2019-04-01 DIAGNOSIS — J9621 Acute and chronic respiratory failure with hypoxia: Secondary | ICD-10-CM | POA: Diagnosis not present

## 2019-04-01 DIAGNOSIS — I272 Pulmonary hypertension, unspecified: Secondary | ICD-10-CM | POA: Diagnosis present

## 2019-04-01 DIAGNOSIS — I7101 Dissection of thoracic aorta: Secondary | ICD-10-CM | POA: Diagnosis present

## 2019-04-01 DIAGNOSIS — Z8547 Personal history of malignant neoplasm of testis: Secondary | ICD-10-CM | POA: Diagnosis not present

## 2019-04-01 DIAGNOSIS — R601 Generalized edema: Secondary | ICD-10-CM | POA: Diagnosis present

## 2019-04-01 DIAGNOSIS — Z954 Presence of other heart-valve replacement: Secondary | ICD-10-CM

## 2019-04-01 DIAGNOSIS — Z452 Encounter for adjustment and management of vascular access device: Secondary | ICD-10-CM

## 2019-04-01 DIAGNOSIS — I447 Left bundle-branch block, unspecified: Secondary | ICD-10-CM | POA: Diagnosis present

## 2019-04-01 DIAGNOSIS — G4733 Obstructive sleep apnea (adult) (pediatric): Secondary | ICD-10-CM | POA: Diagnosis present

## 2019-04-01 DIAGNOSIS — E1151 Type 2 diabetes mellitus with diabetic peripheral angiopathy without gangrene: Secondary | ICD-10-CM | POA: Diagnosis present

## 2019-04-01 DIAGNOSIS — R600 Localized edema: Secondary | ICD-10-CM

## 2019-04-01 DIAGNOSIS — M545 Low back pain: Secondary | ICD-10-CM | POA: Diagnosis present

## 2019-04-01 DIAGNOSIS — K219 Gastro-esophageal reflux disease without esophagitis: Secondary | ICD-10-CM | POA: Diagnosis present

## 2019-04-01 DIAGNOSIS — Z882 Allergy status to sulfonamides status: Secondary | ICD-10-CM

## 2019-04-01 DIAGNOSIS — I4891 Unspecified atrial fibrillation: Secondary | ICD-10-CM | POA: Diagnosis present

## 2019-04-01 DIAGNOSIS — R0603 Acute respiratory distress: Secondary | ICD-10-CM | POA: Diagnosis not present

## 2019-04-01 DIAGNOSIS — I2510 Atherosclerotic heart disease of native coronary artery without angina pectoris: Secondary | ICD-10-CM | POA: Diagnosis present

## 2019-04-01 DIAGNOSIS — D509 Iron deficiency anemia, unspecified: Secondary | ICD-10-CM | POA: Diagnosis present

## 2019-04-01 DIAGNOSIS — Z808 Family history of malignant neoplasm of other organs or systems: Secondary | ICD-10-CM

## 2019-04-01 DIAGNOSIS — E119 Type 2 diabetes mellitus without complications: Secondary | ICD-10-CM

## 2019-04-01 DIAGNOSIS — Z9049 Acquired absence of other specified parts of digestive tract: Secondary | ICD-10-CM

## 2019-04-01 DIAGNOSIS — Z8673 Personal history of transient ischemic attack (TIA), and cerebral infarction without residual deficits: Secondary | ICD-10-CM

## 2019-04-01 DIAGNOSIS — Z91048 Other nonmedicinal substance allergy status: Secondary | ICD-10-CM

## 2019-04-01 DIAGNOSIS — I50813 Acute on chronic right heart failure: Secondary | ICD-10-CM | POA: Diagnosis not present

## 2019-04-01 DIAGNOSIS — G459 Transient cerebral ischemic attack, unspecified: Secondary | ICD-10-CM | POA: Diagnosis present

## 2019-04-01 DIAGNOSIS — Z880 Allergy status to penicillin: Secondary | ICD-10-CM

## 2019-04-01 DIAGNOSIS — G8929 Other chronic pain: Secondary | ICD-10-CM | POA: Diagnosis present

## 2019-04-01 DIAGNOSIS — I87309 Chronic venous hypertension (idiopathic) without complications of unspecified lower extremity: Secondary | ICD-10-CM | POA: Diagnosis present

## 2019-04-01 DIAGNOSIS — M1990 Unspecified osteoarthritis, unspecified site: Secondary | ICD-10-CM | POA: Diagnosis present

## 2019-04-01 DIAGNOSIS — R0602 Shortness of breath: Secondary | ICD-10-CM

## 2019-04-01 DIAGNOSIS — Z86010 Personal history of colonic polyps: Secondary | ICD-10-CM

## 2019-04-01 DIAGNOSIS — Z87891 Personal history of nicotine dependence: Secondary | ICD-10-CM

## 2019-04-01 DIAGNOSIS — I4819 Other persistent atrial fibrillation: Secondary | ICD-10-CM | POA: Diagnosis not present

## 2019-04-01 DIAGNOSIS — I959 Hypotension, unspecified: Secondary | ICD-10-CM | POA: Diagnosis not present

## 2019-04-01 DIAGNOSIS — Z6841 Body Mass Index (BMI) 40.0 and over, adult: Secondary | ICD-10-CM

## 2019-04-01 DIAGNOSIS — L03115 Cellulitis of right lower limb: Secondary | ICD-10-CM | POA: Diagnosis present

## 2019-04-01 DIAGNOSIS — T50995A Adverse effect of other drugs, medicaments and biological substances, initial encounter: Secondary | ICD-10-CM | POA: Diagnosis not present

## 2019-04-01 DIAGNOSIS — L03119 Cellulitis of unspecified part of limb: Secondary | ICD-10-CM

## 2019-04-01 DIAGNOSIS — Z79891 Long term (current) use of opiate analgesic: Secondary | ICD-10-CM

## 2019-04-01 DIAGNOSIS — I361 Nonrheumatic tricuspid (valve) insufficiency: Secondary | ICD-10-CM | POA: Diagnosis not present

## 2019-04-01 DIAGNOSIS — E876 Hypokalemia: Secondary | ICD-10-CM

## 2019-04-01 DIAGNOSIS — I081 Rheumatic disorders of both mitral and tricuspid valves: Secondary | ICD-10-CM | POA: Diagnosis present

## 2019-04-01 DIAGNOSIS — E878 Other disorders of electrolyte and fluid balance, not elsewhere classified: Secondary | ICD-10-CM

## 2019-04-01 DIAGNOSIS — M109 Gout, unspecified: Secondary | ICD-10-CM | POA: Diagnosis present

## 2019-04-01 DIAGNOSIS — Z823 Family history of stroke: Secondary | ICD-10-CM

## 2019-04-01 DIAGNOSIS — I87319 Chronic venous hypertension (idiopathic) with ulcer of unspecified lower extremity: Secondary | ICD-10-CM | POA: Diagnosis present

## 2019-04-01 DIAGNOSIS — Z7901 Long term (current) use of anticoagulants: Secondary | ICD-10-CM

## 2019-04-01 DIAGNOSIS — Z8249 Family history of ischemic heart disease and other diseases of the circulatory system: Secondary | ICD-10-CM

## 2019-04-01 DIAGNOSIS — Z560 Unemployment, unspecified: Secondary | ICD-10-CM

## 2019-04-01 LAB — COMPREHENSIVE METABOLIC PANEL
ALT: 9 U/L (ref 0–44)
AST: 14 U/L — ABNORMAL LOW (ref 15–41)
Albumin: 1.7 g/dL — ABNORMAL LOW (ref 3.5–5.0)
Alkaline Phosphatase: 57 U/L (ref 38–126)
Anion gap: 11 (ref 5–15)
BUN: 44 mg/dL — ABNORMAL HIGH (ref 6–20)
CO2: 34 mmol/L — ABNORMAL HIGH (ref 22–32)
Calcium: 7.6 mg/dL — ABNORMAL LOW (ref 8.9–10.3)
Chloride: 83 mmol/L — ABNORMAL LOW (ref 98–111)
Creatinine, Ser: 1.21 mg/dL (ref 0.61–1.24)
GFR calc Af Amer: >60 mL/min (ref >60)
GFR calc non Af Amer: >60 mL/min (ref >60)
Glucose, Bld: 94 mg/dL (ref 70–99)
Potassium: 2.9 mmol/L — ABNORMAL LOW (ref 3.5–5.1)
Sodium: 128 mmol/L — ABNORMAL LOW (ref 135–145)
Total Bilirubin: 0.8 mg/dL (ref 0.3–1.2)
Total Protein: 4.2 g/dL — ABNORMAL LOW (ref 6.5–8.1)

## 2019-04-01 LAB — CBC WITH DIFFERENTIAL/PLATELET
Abs Immature Granulocytes: 0 10*3/uL (ref 0.00–0.07)
Basophils Absolute: 0 10*3/uL (ref 0.0–0.1)
Basophils Relative: 0 %
Eosinophils Absolute: 0.1 10*3/uL (ref 0.0–0.5)
Eosinophils Relative: 1 %
HCT: 33 % — ABNORMAL LOW (ref 39.0–52.0)
Hemoglobin: 9.6 g/dL — ABNORMAL LOW (ref 13.0–17.0)
Lymphocytes Relative: 2 %
Lymphs Abs: 0.2 10*3/uL — ABNORMAL LOW (ref 0.7–4.0)
MCH: 21.3 pg — ABNORMAL LOW (ref 26.0–34.0)
MCHC: 29.1 g/dL — ABNORMAL LOW (ref 30.0–36.0)
MCV: 73.2 fL — ABNORMAL LOW (ref 80.0–100.0)
Monocytes Absolute: 0.2 10*3/uL (ref 0.1–1.0)
Monocytes Relative: 2 %
Neutro Abs: 11 10*3/uL — ABNORMAL HIGH (ref 1.7–7.7)
Neutrophils Relative %: 95 %
Platelets: 264 10*3/uL (ref 150–400)
RBC: 4.51 MIL/uL (ref 4.22–5.81)
RDW: 22 % — ABNORMAL HIGH (ref 11.5–15.5)
WBC: 11.6 10*3/uL — ABNORMAL HIGH (ref 4.0–10.5)
nRBC: 0 % (ref 0.0–0.2)
nRBC: 0 /100 WBC

## 2019-04-01 LAB — BRAIN NATRIURETIC PEPTIDE: B Natriuretic Peptide: 302.1 pg/mL — ABNORMAL HIGH (ref 0.0–100.0)

## 2019-04-01 LAB — PROTIME-INR
INR: 3.9 — ABNORMAL HIGH (ref 0.8–1.2)
Prothrombin Time: 37.4 seconds — ABNORMAL HIGH (ref 11.4–15.2)

## 2019-04-01 MED ORDER — SODIUM CHLORIDE 0.9 % IV SOLN: 250.0000 mL | INTRAVENOUS | Status: DC | PRN

## 2019-04-01 MED ORDER — MUPIROCIN 2 % EX OINT
1.0000 "application " | TOPICAL_OINTMENT | Freq: Two times a day (BID) | CUTANEOUS | Status: DC | PRN
  Administered 2019-04-10: 1 via TOPICAL
  Filled 2019-04-01: qty 22

## 2019-04-01 MED ORDER — ALLOPURINOL 300 MG PO TABS
300.0000 mg | ORAL_TABLET | Freq: Every day | ORAL | Status: DC
  Administered 2019-04-02 – 2019-04-11 (×10): 300 mg via ORAL
  Filled 2019-04-01 (×10): qty 1

## 2019-04-01 MED ORDER — POTASSIUM CHLORIDE 10 MEQ/100ML IV SOLN
10.0000 meq | Freq: Once | INTRAVENOUS | Status: AC
  Administered 2019-04-01: 10 meq via INTRAVENOUS
  Filled 2019-04-01: qty 100

## 2019-04-01 MED ORDER — OXYCODONE HCL 5 MG PO TABS
5.0000 mg | ORAL_TABLET | Freq: Three times a day (TID) | ORAL | Status: DC | PRN
  Administered 2019-04-02 – 2019-04-11 (×17): 5 mg via ORAL
  Filled 2019-04-01 (×17): qty 1

## 2019-04-01 MED ORDER — GABAPENTIN 300 MG PO CAPS
300.0000 mg | ORAL_CAPSULE | Freq: Three times a day (TID) | ORAL | Status: DC
  Administered 2019-04-02 – 2019-04-05 (×12): 300 mg via ORAL
  Filled 2019-04-01 (×13): qty 1

## 2019-04-01 MED ORDER — POTASSIUM CHLORIDE 10 MEQ/100ML IV SOLN: 10.0000 meq | INTRAVENOUS | Status: DC

## 2019-04-01 MED ORDER — SODIUM CHLORIDE 0.9% FLUSH
3.0000 mL | Freq: Two times a day (BID) | INTRAVENOUS | Status: DC
  Administered 2019-04-02 – 2019-04-06 (×7): 3 mL via INTRAVENOUS

## 2019-04-01 MED ORDER — SILVER SULFADIAZINE 1 % EX CREA
1.0000 "application " | TOPICAL_CREAM | Freq: Every day | CUTANEOUS | Status: DC | PRN
  Administered 2019-04-02 – 2019-04-08 (×2): 1 via TOPICAL
  Filled 2019-04-01: qty 85

## 2019-04-01 MED ORDER — MAGNESIUM OXIDE 400 (241.3 MG) MG PO TABS
400.0000 mg | ORAL_TABLET | Freq: Two times a day (BID) | ORAL | Status: DC
  Administered 2019-04-02 – 2019-04-11 (×20): 400 mg via ORAL
  Filled 2019-04-01 (×20): qty 1

## 2019-04-01 MED ORDER — METHOCARBAMOL 500 MG PO TABS
500.0000 mg | ORAL_TABLET | Freq: Three times a day (TID) | ORAL | Status: DC
  Administered 2019-04-02 – 2019-04-05 (×11): 500 mg via ORAL
  Filled 2019-04-01 (×13): qty 1

## 2019-04-01 MED ORDER — ACETAMINOPHEN 325 MG PO TABS
650.0000 mg | ORAL_TABLET | ORAL | Status: DC | PRN
  Administered 2019-04-04 – 2019-04-10 (×2): 650 mg via ORAL
  Filled 2019-04-01 (×2): qty 2

## 2019-04-01 MED ORDER — FUROSEMIDE 10 MG/ML IJ SOLN
40.0000 mg | Freq: Once | INTRAMUSCULAR | Status: AC
  Administered 2019-04-01: 40 mg via INTRAVENOUS
  Filled 2019-04-01: qty 4

## 2019-04-01 MED ORDER — ONDANSETRON HCL 4 MG/2ML IJ SOLN: 4.0000 mg | Freq: Four times a day (QID) | INTRAMUSCULAR | Status: DC | PRN

## 2019-04-01 MED ORDER — POTASSIUM CHLORIDE CRYS ER 20 MEQ PO TBCR
40.0000 meq | EXTENDED_RELEASE_TABLET | Freq: Three times a day (TID) | ORAL | Status: DC
  Administered 2019-04-02 (×4): 40 meq via ORAL
  Filled 2019-04-01 (×4): qty 2

## 2019-04-01 MED ORDER — SODIUM CHLORIDE 0.9% FLUSH: 3.0000 mL | INTRAVENOUS | Status: DC | PRN

## 2019-04-01 MED ORDER — ASPIRIN EC 81 MG PO TBEC
81.0000 mg | DELAYED_RELEASE_TABLET | Freq: Every day | ORAL | Status: DC
  Administered 2019-04-02 – 2019-04-11 (×10): 81 mg via ORAL
  Filled 2019-04-01 (×11): qty 1

## 2019-04-01 NOTE — ED Notes (Signed)
Pt back from x-ray.

## 2019-04-01 NOTE — H&P (Signed)
History & Physical    Patient ID: Lance Cook MRN: [id number], DOB/AGE: 1961/04/06   Admit date: 04/01/2019   Primary Physician: [name], MD Primary Cardiologist: [name], MD  Patient Profile    Lance Cook is a 58 year old gentleman with HFpEF, mechanical AVR and MVR, CAD s/p SVG-RCA, chronic afib, ascending aortic dissection (chronic), h/o CVA, OSA who presented with recurrent, severe volume overload.   Past Medical History    Past Medical History:  Diagnosis Date  . Acute on chronic diastolic CHF (congestive heart failure) ([place]) 08/27/2016  . Anemia   . Anxiety   . ARDS (adult respiratory distress syndrome) ([place]) 01/27/2015  . Arthritis   . Ascending aortic dissection ([place]) 07/14/2008   Localized dissection of ascending aorta noted on CTA in 2009 and stable on CTA in 2011  . Asymptomatic chronic venous hypertension 01/15/2010   Overview:  Overview:  Qualifier: Diagnosis of  By: [name] MD, [name]   Last Assessment & Plan:  I suggested he go to a vein clinic to see if he could be qualified compression hose of some type, since he is not a surgical candidate according to the vascular surgeons.   . Atrial fibrillation ([place])    chronic persistent  . Bell's palsy   . C. difficile diarrhea   . CAD (coronary artery disease)    Old scar inferior wall myoview, 10/2009 EF 52%.  He did have previous SVG to RCA but no obstructive disease noted on his most recent cath.  SVG occluded.   . Cellulitis 04/02/2014  . Cerebral artery occlusion with cerebral infarction ([place]) 10/08/2011   Overview:  Overview:  And hx of TIA prior to CABG, all thought due to systemic emboli prior to coumadin   . Chronic diastolic heart failure ([place]) 03/01/2015   a. 12/2015: echo showing a preserved EF of 65-70%, moderate AS, and moderate TR.   Marland Kitchen Chronic LBP 10/08/2011  . CVA (cerebral vascular accident) ([place]) 10/08/2011   And hx of TIA prior to CABG, all thought due to systemic emboli prior to coumadin    . DIABETES MELLITUS, TYPE II 11/01/2009   Qualifier: Diagnosis of  By: [name] MD, [name]    . Diverticulosis   . 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 Encephalopathy acute 02/05/2015  . Gastric polyps   . GERD (gastroesophageal reflux disease)    not needing medication at thhis time- 01/22/15  . Gout   . H/O mechanical aortic valve replacement 11/01/2009   Qualifier: Diagnosis of  By: [name], [name]    . Hiatal hernia   . History of colon polyps 12/23/2011   Overview:  Overview:  Colonoscopy January 2010, 6 mm rectal tubulovillous adenoma. No high-grade dysplasia   . HTN (hypertension) 02/23/2014  . Hyperlipidemia   . Hypertension   . Hypogonadism male 04/08/2012  . Impaired glucose tolerance 10/08/2011  . Morbid obesity ([place]) 04/02/2014  . Myocardial infarction [facility]) age 78  . OSA (obstructive sleep apnea)    CPAP  . Peripheral vascular disease ([place])   . 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 02/05/3015   33 mm St Jude bileaflet mechanical valve placed via right mini thoracotomy approach  . 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    1988 - Dr Blase Mess at [facility] in [place],  [place]  . Severe mitral regurgitation 10/11/2014  . Testicular cancer [facility])    He was 58 y/o. He had surgical resection and rad tx's.   Marland Kitchen TIA (transient ischemic attack)    age 89  . Tubulovillous adenoma of colon   . Varicose veins   . 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 Venous (peripheral) insufficiency 02/03/2005    Past Surgical History:  Procedure Laterality Date  . aortic truck    . BENTALL PROCEDURE  1988   25 mm St Jude mechanical valve conduit - Dr Blase Mess at [facility] in [place], [place]  . BIOPSY  06/01/2018   Procedure: BIOPSY;  Surgeon: Jerene Bears, MD;  Location: [facility];  Service: Gastroenterology;;  . CARDIAC VALVE REPLACEMENT     [phone]  . CHOLECYSTECTOMY  2011  . COLONOSCOPY WITH PROPOFOL N/A 06/17/2016   Procedure: COLONOSCOPY WITH PROPOFOL;  Surgeon: Jerene Bears, MD;  Location: [facility];  Service: Gastroenterology;  Laterality: N/A;  . COLONOSCOPY WITH PROPOFOL N/A 06/01/2018   Procedure: COLONOSCOPY WITH PROPOFOL;  Surgeon: Jerene Bears, MD;  Location: [facility];  Service: Gastroenterology;  Laterality: N/A;  . CORONARY ARTERY BYPASS GRAFT    . LASER ABLATION Left 03/06/2010   leg  . MITRAL VALVE REPLACEMENT Right 01/24/2015   Procedure: Re-Operation, MINIMALLY INVASIVE MITRAL VALVE (MV) REPLACEMENT.;  Surgeon: Rexene Alberts, MD;  Location: [facility];  Service: Open Heart Surgery;  Laterality: Right;  . ORCHIECTOMY  age 2   testicular cancer  . OTOPLASTY     bilateral, age 80  . POLYPECTOMY  06/01/2018   Procedure: POLYPECTOMY;  Surgeon: Jerene Bears, MD;  Location: [facility];  Service: Gastroenterology;;  . TEE WITHOUT CARDIOVERSION N/A 10/11/2014   Procedure: TRANSESOPHAGEAL ECHOCARDIOGRAM (TEE);  Surgeon: Pixie Casino, MD;  Location: [facility] ENDOSCOPY;  Service: Cardiovascular;  Laterality: N/A;  . TEE WITHOUT CARDIOVERSION N/A 01/24/2015   Procedure: TRANSESOPHAGEAL ECHOCARDIOGRAM (TEE);  Surgeon: Rexene Alberts, MD;  Location: [facility];  Service: Open Heart Surgery;  Laterality: N/A;  . TONSILLECTOMY  1967  . TOOTH EXTRACTION  2016     Allergies  Allergies  Allergen Reactions  . Penicillins Hives and Rash    Has patient had a PCN reaction causing immediate rash, facial/tongue/throat swelling, SOB or lightheadedness with hypotension: Yes Has patient had a PCN reaction causing severe rash involving mucus membranes or skin necrosis: Yes Has patient had a PCN reaction that required hospitalization unsure Has patient had a PCN reaction occurring within the last 10 years: 2010-2012 If all of the above  answers are "NO", then may proceed with Cephalosporin use.  [name] Other (See Comments)    CDIFF, when taking oral tablets   . Tape Other (See Comments) and Itching    Skin irritation    History of Present Illness    Lance Cook is a 58 year old gentleman with HFpEF, mechanical AVR and MVR, CAD s/p SVG-RCA, chronic afib, ascending aortic dissection (chronic), h/o CVA, OSA who presented with recurrent, severe volume overload.   Notably the patient was recently admitted from 02/13/19 until 6/28. At that time, he presented with severe volume overload and was diuresed a total of 26L with weight decreasing from 218 lbs to 198 lbs. The patient was discharged on bumex 2mg  BID. He reports that since his discharge approximately one month ago he has had steady increase in volume retention. He has worked with his outpatient heart failure  team to: increase bumex to 4mg  BID, add metolazone initially once daily and then increased to twice daily. Yesterday he received IV lasix x2. Unfortunately, despite these measures, he has continued to become increasingly volume overloaded with a 20lb weight gain since discharge.   The patient reports that in this setting his shortness of breath has worsened, he cannot walk across a room without significant dyspnea. He has severe LEE and now a weeping wound on his right anterior lower leg. He notes decreased urine output despite the increasing doses of diuretics. Also with intermittent light headedness. No palpitations or syncope. Notes chest pain with severe shortness of breath when he "pushes himself". No bleeding including BRBPR, melena. No fever or chills.   Home Medications    Prior to Admission medications   Medication Sig Start Date End Date Taking? Authorizing Provider  allopurinol (ZYLOPRIM) 300 MG tablet Take 300 mg by mouth daily. 11/17/12  Yes Biagio Borg, MD  aspirin EC 81 MG EC tablet Take 1 tablet (81 mg total) by mouth daily. 02/27/19   Yes [name], MD  bumetanide (BUMEX) 2 MG tablet Take 1 tablet (2 mg total) by mouth 2 (two) times daily. Patient taking differently: Take 4 mg by mouth 2 (two) times daily.  02/27/19  Yes [name], MD  gabapentin (NEURONTIN) 300 MG capsule Take 300 mg by mouth 3 (three) times daily.   Yes [provider]  magnesium oxide (MAG-OX) 400 (241.3 MG) MG tablet Take 1 tablet (400 mg total) by mouth 2 (two) times daily. Patient taking differently: Take 400 mg by mouth 3 (three) times daily.  06/04/15  Yes [name], MD  methocarbamol (ROBAXIN) 500 MG tablet Take 1 tablet (500 mg total) by mouth 3 (three) times daily. 07/30/18 04/01/19 Yes Vevelyn Francois, NP  oxyCODONE (OXY IR/ROXICODONE) 5 MG immediate release tablet Take 1 tablet (5 mg total) by mouth every 8 (eight) hours as needed for severe pain. Patient taking differently: Take 5 mg by mouth 3 (three) times daily.  09/28/18 04/01/19 Yes King, Diona Foley, NP  potassium chloride SA (K-DUR) 20 MEQ tablet Take 2 tablets (40 mEq total) by mouth 3 (three) times daily. 02/27/19  Yes [name], MD  PRESCRIPTION MEDICATION See admin instructions. CPAP- At bedtime and during any time of rest   Yes [provider]  metolazone (ZAROXOLYN) 5 MG tablet Take 10 mg by mouth every morning. 04/26/18 12/16/19  [provider]  metoprolol succinate (TOPROL-XL) 25 MG 24 hr tablet Take 1.5 tablets (37.5 mg total) by mouth 2 (two) times daily. 02/27/19   [name], MD  mupirocin ointment (BACTROBAN) 2 % Apply 1 application topically 2 (two) times daily as needed (infection prevention- both legs).     [provider]  silver sulfADIAZINE (SILVADENE) 1 % cream Apply 1 application topically daily as needed (for wound care- both legs).     [provider]  traZODone (DESYREL) 100 MG tablet Take 100 mg by mouth at bedtime.  01/24/19   [provider]  warfarin (COUMADIN) 5 MG tablet 5 mg  daily, and 2.5 mg on Mondays Patient taking differently: Take 5 mg by mouth.  02/27/19   [name], MD    Family History    Family History  Problem Relation Age of Onset  . Heart disease Mother   . Throat cancer Mother   . Other Mother        bowel obstruction  .  Diabetes Father   . Stroke Other   . Hypertension Other   . Cancer Maternal Aunt        lung, brain  . Cancer Maternal Uncle        brain aneurysm  . Colon cancer Neg Hx    He indicated that his mother is deceased. He indicated that his father is deceased. He indicated that his maternal grandmother is deceased. He indicated that his maternal grandfather is deceased. He indicated that his paternal grandmother is deceased. He indicated that his paternal grandfather is deceased. He indicated that the status of his maternal aunt is unknown. He indicated that the status of his maternal uncle is unknown. He indicated that the status of his neg hx is unknown. He indicated that the status of his other is unknown.   Social History    Social History   Socioeconomic History  . Marital status: Married    Spouse name: Not on file  . Number of children: 0  . Years of education: Not on file  . Highest education level: Not on file  Occupational History  . Occupation: Disabled Environmental education officer: UNEMPLOYED  Social Needs  . Financial resource strain: Not on file  . Food insecurity    Worry: Not on file    Inability: Not on file  . Transportation needs    Medical: Not on file    Non-medical: Not on file  Tobacco Use  . Smoking status: Former Smoker    Types: Cigars  . Smokeless tobacco: Never Used  . Tobacco comment: about 3 yearly- cigar  Substance and Sexual Activity  . Alcohol use: Yes    Alcohol/week: 0.0 standard drinks    Comment: social  . Drug use: No  . Sexual activity: Not on file  Lifestyle  . Physical activity    Days per week: Not on file    Minutes per session: Not on file  . Stress: Not on  file  Relationships  . Social Herbalist on phone: Not on file    Gets together: Not on file    Attends religious service: Not on file    Active member of club or organization: Not on file    Attends meetings of clubs or organizations: Not on file    Relationship status: Not on file  . Intimate partner violence    Fear of current or ex partner: Not on file    Emotionally abused: Not on file    Physically abused: Not on file    Forced sexual activity: Not on file  Other Topics Concern  . Not on file  Social History Narrative  . Not on file     Review of Systems    General:  No fever, night sweats or weight changes.  Cardiovascular:  As per HPI Dermatological: No rash, lesions/masses Respiratory: No cough, + dyspnea as per HPI Urologic: No hematuria, dysuria Abdominal:   No nausea, vomiting, diarrhea, bright red blood per rectum, melena, or hematemesis Neurologic:  No visual changes, wkns, changes in mental status. All other systems reviewed and are otherwise negative except as noted above.  Physical Exam    Blood pressure 105/66, pulse 86, temperature 98.3 F (36.8 C), temperature source Oral, resp. rate (!) 24, SpO2 100 %.  General: Pleasant, appears chronically ill Psych: Normal affect. Neuro: Alert and oriented X 3. Moves all extremities spontaneously. HEENT: Normal  Neck: Supple without bruits. JVP to the earlobe in the  upright position Lungs:  Poor inspiratory effort with scattered crackles.  Heart: RRR. Mechanical S1/S2. no s3, s4, or murmurs. Abdomen: Soft, non-tender, BS + x 4.  Extremities: Anasarca of bilateral lower extremities extending into abdomen. RLE with anterior wound with surrounding erythema.   Labs    High Sensitivity Troponin No results for input(s): TROPONINIHS in the last 720 hours.   Cardiac Enzymes No results for input(s): TROPONINI in the last 168 hours. No results for input(s): TROPIPOC in the last 168 hours.   Lab Results   Component Value Date   WBC 11.6 (H) 04/01/2019   HGB 9.6 (L) 04/01/2019   HCT 33.0 (L) 04/01/2019   MCV 73.2 (L) 04/01/2019   PLT 264 04/01/2019    Recent Labs  Lab 04/01/19 2157  NA 128*  K 2.9*  CL 83*  CO2 34*  BUN 44*  CREATININE 1.21  CALCIUM 7.6*  PROT 4.2*  BILITOT 0.8  ALKPHOS 57  ALT 9  AST 14*  GLUCOSE 94   Lab Results  Component Value Date   CHOL 118 08/18/2012   HDL 27.20 (L) 08/18/2012   LDLCALC 64 08/18/2012   TRIG 133.0 08/18/2012   No results found for: [facility]   Radiology Studies    Dg Chest 2 View  Result Date: 04/01/2019 CLINICAL DATA:  Shortness of breath EXAM: CHEST - 2 VIEW COMPARISON:  February 13, 2019 FINDINGS: There is a nodular opacity in the right lower lobe measuring 1.4 x 1.0 cm. There is no edema or consolidation. There is cardiomegaly. There is prominence of the central pulmonary arteries with fairly rapid peripheral tapering, findings felt to represent pulmonary arterial hypertension. Patient is status post mitral valve replacement. No adenopathy. No bone lesions. IMPRESSION: 1.4 x 1.0 cm right lower lobe nodular opacity. This finding warrants correlation with noncontrast enhanced chest CT to further evaluate. No edema or consolidation. There is stable cardiomegaly with evident pulmonary arterial hypertension. No adenopathy evident. Patient is status post mitral valve replacement. Electronically Signed   By: Lowella Grip III M.D.   On: 04/01/2019 21:49    ECG & Cardiac Imaging    04/01/19 - personally reviewed. Atrial fibrillation; LBBB. No acute ischemia.  [facility] 12/18/14 Hemodynamic Findings: Ao: 116/63    LV: Not measured RA: 16             RV: 76/2/16 PA: 74/30 (mean 47)      PCWP: 24-28 Fick Cardiac Output: 4.38 L/min Fick Cardiac Index: 2.12 L/min/m2 Central Aortic Saturation: 93% Pulmonary Artery Saturation: 60%  Impression: 1. Patent native coronary arteries with no obstructive disease. Difficult catheter engagement  given prior aortic root replacement.  2. Probably occluded vein graft to the RCA (it is seen to fill retrograde from the native RCA and there is no competitive flow) 3. Elevated pressures as above 4. Abnormal findings in aortic root as noted above.   TTE 01/31/19 1. The left ventricle has normal systolic function, with an ejection fraction of 55-60%. The cavity size was normal. Left ventricular diastolic function could not be evaluated due to mitral valve replacement/repair. There is abnormal septal motion  consistent with left bundle branch block.  2. Mild hypokinesis of the left ventricular, entire septal wall.  3. The right ventricle has normal systolic function. The cavity was normal. There is no increase in right ventricular wall thickness. Right ventricular systolic pressure is severely elevated with an estimated pressure of 62.4 mmHg.  4. Left atrial size was severely dilated.  5. Right  atrial size was severely dilated.  6. A 33 St. Jude mechanical valve is present in the mitral position. Procedure Date: 01/24/2015 There is no Echo evidence of rocking of the mitral prosthesis. Echo findings are consistent with perivalvular leak the mitral prosthesis.  7. Mitral valve regurgitation mild perivalvular. Mitral valve gradient appropriate for prosthesis mitral valve stenosis.  8. The tricuspid valve is grossly normal. Tricuspid valve regurgitation is moderate.  9. A 64mm St. Jude mechanical prosthesis valve is present in the aortic position. Normal aortic valve prosthesis. Echo findings are consistent with Mean gradient ~30 mmHg of the aortic prosthesis. 10. The aortic arch is normal in size and structure. 11. There is dilatation of the ascending aorta measuring 38 mm. 12. Post Bentall 1988. 13. The inferior vena cava was dilated in size with <50% respiratory variability.  Assessment & Plan    Lance Cook is a 58 year old gentleman with HFpEF, mechanical AVR and MVR, CAD s/p SVG-RCA, pulmonary  hypertension, chronic afib, ascending aortic dissection (chronic), h/o CVA, OSA who presented with recurrent, severe volume overload.   # Acute on chronic HFpEF Patient with known diastolic heart failure and now severe anasarca, volume overload despite escalating doses of diuretics at home.  - Received 40mg  IV lasix in ED - likely will need significantly more but will replete K before giving additional diuresis tonight. Previously responded to 120mg  IV BID lasix - Hold home metolazone given hyponatremia and hypokalemia - Hold home bumex with IV diuresis - Hold home metoprolol given soft blood pressures and acute decompensation - Consider RHC when patient closer to euvolemic.   # Severe electrolyte abnormalities - Cont home potassium - 40 meq TID; Give 40 meq IV potassium now as well; recheck in AM  - Hyponatremia likely related to volume overload and increased use of metolazone at home; diuresis as above - cont home magnesium  # Cellulitis Chronic skin changes of bilateral lower extremities but RLE also with open wound and surrounding erythema which patient reports is different than baseline. Mild leukocytosis on blood work. Given recent hospitalization, will treat for cellulitis with MRSA coverage.  - Clindamycin 450mg  Q8H  # Mechanical AVR with 25 St Jude and MVR with 33 St Jude TTE in June showed mean gradient of 30 mmHg across aortic prosthesis. Mild paravalvular leak of mitral prosthesis - Hold home warfarin; INR on admission elevated at 3.9 with goal of 3.0; pharmacy consult placed - Cont ASA 81mg  daily  # Chronic Atrial fibrillation - Hold home warfarin as above  # Iron deficiency anemia - Start oral iron supplementation; would likely benefit from IV iron but will hold for now given volume overload  # OSA - CPAP  # DMII - SSI, accuchecks  # Chronic pain - Cont home gabapentin, robaxin, oxycodone  # Gout - Cont home allopurinol  # Low fat, low cholesterol diet with  1.5L fluid restriction  # PT/OT/wound consults placed  # FULL CODE   Signed, Bryna Colander, MD 04/01/2019, 11:38 PM

## 2019-04-01 NOTE — ED Triage Notes (Signed)
Pt brought in by [facility] from home for chest/abdomen/extemity swelling. Pt recently D/C'd on the 17th of July, endorses 20lb weight gain since then. Pt states he received 80mg  lasix by PCP yesterday, endorses increase in urine output but not changes in his weight. Pt in afib on ECG, hx same. Pt RA O2 90%, 100% on 4L. Pt does not typically require home O2. Pt in NAD on arrival to ED.

## 2019-04-01 NOTE — ED Notes (Signed)
Nurse collecting labs. 

## 2019-04-01 NOTE — ED Notes (Signed)
Patient transported to X-ray 

## 2019-04-01 NOTE — Telephone Encounter (Signed)
Paged by Mr. Cizek. He has a PMH of mechanical AVR (s/p Bentall procedure in 1988),severe mitral regurgitation(s/p StJude mechanical valve placed 12/2014),CAD s/pSVG-RCA which is known to be occluded by cath in April 2016, chronic atrial fibrillation, chronic diastolic heart failure with baseline EF 65 to 70%, prior CVA, chronic venous insufficiency, obstructive sleep apnea on CPAP and recurrent C. difficile infection.   Patient recently had prolonged hospitalization for acute diastolic heart failure with diffuse anasarca.  His previous torsemide has been switched to Bumex 2 mg twice daily.  Since discharge, he has increased Bumex to 4 mg twice daily however continue to have volume accumulation.  His discharge weight was 198 pounds, however this morning his weight was 218 pounds.  He is very short of breath on the phone.  I encouraged him to go to the [facility] emergency room for evaluation as he likely has recurrent acute on chronic diastolic heart failure.  I will let our cardiology fellow overnight knows and will try to see him early.  If he is adamant that, we likely have Dr. [name] our heart failure specialist to round on him tomorrow.  I have informed our ED charge nurse of his arrival by EMS.  Unfortunately his wife cannot drive.

## 2019-04-01 NOTE — ED Provider Notes (Signed)
Lance Cook   CSN: [id number] Arrival date & time: 04/01/19  2040    History   Chief Complaint Chief Complaint  Patient presents with  . Congestive Heart Failure  . Leg Swelling    HPI [name] is a 58 y.o. male past medical history of CHF, ARDS, a sending aortic dissection, cellulitis, CVA, diabetes brought in by EMS for evaluation of progressive worsening shortness of breath and peripheral edema.  Patient was recently admitted to the hospital for CHF exacerbation.  He was discharged on 6/to 8/20.  He was discontinued on his Lasix and started on Bumex which he states he has been compliant with.  He reports that over the last several days, he has noticed a 20 pound weight increase and has noted worsening peripheral edema in his legs and at times up to his abdomen and chest.  He was seen by cardiology yesterday who gave him 80 mg of IV Lasix which he states did help improve some of the edema.  He states that the edema went back down to his thighs.  He called EMS today because he was having significant shortness of breath.  He states that it had been ongoing for the last several days but really could have worsened today.  He reports that even walking short distances from the couch to the kitchen caused him to be severely short of breath.  He has had a cough that is productive of sputum.  No hemoptysis.  He states he has not been able to lay flat secondary to symptoms.  On initial EMS arrival, patient was satting between 88-90%.  They placed him on 4 L O2 which bumped him up to 100%.  He does not have any oxygen requirement at home.  He denies any recent fevers, abdominal pain, nausea/vomiting, numbness/weakness.      The history is provided by the patient.    Past Medical History:  Diagnosis Date  . Acute on chronic diastolic CHF (congestive heart failure) ([place]) 08/27/2016  . Anemia   . Anxiety   . ARDS (adult respiratory distress  syndrome) ([place]) 01/27/2015  . Arthritis   . Ascending aortic dissection ([place]) 07/14/2008   Localized dissection of ascending aorta noted on CTA in 2009 and stable on CTA in 2011  . Asymptomatic chronic venous hypertension 01/15/2010   Overview:  Overview:  Qualifier: Diagnosis of  By: [name] MD, [name]   Last Assessment & Plan:  I suggested he go to a vein clinic to see if he could be qualified compression hose of some type, since he is not a surgical candidate according to the vascular surgeons.   . Atrial fibrillation ([place])    chronic persistent  . Bell's palsy   . C. difficile diarrhea   . CAD (coronary artery disease)    Old scar inferior wall myoview, 10/2009 EF 52%.  He did have previous SVG to RCA but no obstructive disease noted on his most recent cath.  SVG occluded.   . Cellulitis 04/02/2014  . Cerebral artery occlusion with cerebral infarction ([place]) 10/08/2011   Overview:  Overview:  And hx of TIA prior to CABG, all thought due to systemic emboli prior to coumadin   . Chronic diastolic heart failure ([place]) 03/01/2015   a. 12/2015: echo showing a preserved EF of 65-70%, moderate AS, and moderate TR.   Marland Kitchen Chronic LBP 10/08/2011  . CVA (cerebral vascular accident) ([place]) 10/08/2011   And hx of TIA prior to  CABG, all thought due to systemic emboli prior to coumadin   . DIABETES MELLITUS, TYPE II 11/01/2009   Qualifier: Diagnosis of  By: [name] MD, [name]    . Diverticulosis   . 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 Encephalopathy acute 02/05/2015  . Gastric polyps   . GERD (gastroesophageal reflux disease)    not needing medication at thhis time- 01/22/15  . Gout   . H/O mechanical aortic valve replacement 11/01/2009   Qualifier: Diagnosis of  By: [name], [name]    . Hiatal hernia   . History of colon polyps 12/23/2011   Overview:  Overview:   Colonoscopy January 2010, 6 mm rectal tubulovillous adenoma. No high-grade dysplasia   . HTN (hypertension) 02/23/2014  . Hyperlipidemia   . Hypertension   . Hypogonadism male 04/08/2012  . Impaired glucose tolerance 10/08/2011  . Morbid obesity ([place]) 04/02/2014  . Myocardial infarction [facility]) age 89  . OSA (obstructive sleep apnea)    CPAP  . Peripheral vascular disease ([place])   . 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 5/73/2202   33 mm St Jude bileaflet mechanical valve placed via right mini thoracotomy approach  . 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    1988 - Dr Blase Mess at [facility] in [place], [place]  . Severe mitral regurgitation 10/11/2014  . Testicular cancer [facility])    He was 58 y/o. He had surgical resection and rad tx's.   Marland Kitchen TIA (transient ischemic attack)    age 2  . Tubulovillous adenoma of colon   . Varicose veins   . 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 Venous (peripheral) insufficiency 02/03/2005    Patient Active Problem List   Diagnosis Date Noted  . Acute on chronic diastolic (congestive) heart failure ([place]) 04/01/2019  . Acute on chronic congestive heart failure ([place])   . Anasarca   . Pressure injury of skin 02/14/2019  . Acute on chronic heart failure ([place]) 02/13/2019  . Benign neoplasm of ascending colon   . Benign neoplasm of rectum   . Benign neoplasm of rectosigmoid junction   . Benign neoplasm of sigmoid colon   . DM type 2 with diabetic dyslipidemia ([place]) 04/26/2018  . Lower extremity edema 03/08/2018  . Other fatigue 01/28/2018  . Chronic anticoagulation 01/05/2018  . Insomnia 11/23/2017  . Unilateral vocal cord paralysis 11/30/2016  . Leg swelling 11/28/2016  . S/P MVR (mitral valve replacement)   . Acute on chronic diastolic CHF (congestive heart failure) ([place]) 08/27/2016  . Chronic pain syndrome 07/10/2016  . Chronic diarrhea   . Benign neoplasm  of transverse colon   . Scrotal swelling 05/30/2016  . Musculoskeletal pain 01/07/2016  . Personal history of malignant neoplasm of testis 07/12/2015  . Long term current use of opiate analgesic 07/11/2015  . Long term prescription opiate use 07/11/2015  . Opiate use (22.5 MME/day) 07/11/2015  . Opiate dependence ([place]) 07/11/2015  . Encounter for therapeutic drug level monitoring 07/11/2015  . Chronic low back pain (midline) 07/11/2015  . Lumbar facet syndrome 07/11/2015  . Chronic lower extremity pain ([place]) 07/11/2015  . Adult BMI 30+ 06/19/2015  . Obesity (BMI 30-39.9) 06/19/2015  . Hypomagnesemia 02/08/2015  . Physical deconditioning 02/08/2015  . Enteritis due to Clostridium difficile 01/30/2015  . S/P  minimally  invasive mitral valve replacement with metallic valve 99/37/1696  . Chronic diastolic congestive heart failure ([place])   . Type 2 diabetes mellitus with other specified complication ([place]) 78/93/8101  . 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 Venous insufficiency of both lower extremities 04/02/2014  . Morbid obesity ([place]) 04/02/2014  . Diabetes mellitus ([place]) 03/14/2014  . Personal history of transient ischemic attack (TIA), and cerebral infarction without residual deficits 02/23/2014  . Malignant neoplasm of testis ([place]) 02/23/2014  . H/O TIA (transient ischemic attack) and stroke 02/23/2014  . HTN (hypertension) 02/23/2014  . Lymphadenopathy 02/23/2014  . S/P AVR (aortic valve replacement) 02/23/2014  . Encounter for therapeutic drug monitoring 02/02/2014  . Long term current use of anticoagulant therapy 07/15/2012  . Hypogonadism male 04/08/2012  . 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 Testicular hypofunction 04/08/2012  . Male hypogonadism 04/08/2012  . Other testicular hypofunction 04/08/2012  . History of colonic polyps 12/23/2011  . Testicular cancer ([place])   . TIA (transient ischemic attack)   . OSA (obstructive sleep apnea)   .  Lumbar spinal stenosis (Severe L4-5) 10/08/2011  . ED (erectile dysfunction) of organic origin 10/08/2011  . Lumbar canal stenosis 10/08/2011  . Low back pain 10/08/2011  . Spinal stenosis of lumbar region without neurogenic claudication 10/08/2011  . CAD (coronary artery disease)   . Spondylosis 04/29/2010  . DYSLIPIDEMIA 01/15/2010  . Chronic venous hypertension with ulcer ([place]) 01/15/2010  . Essential hypertension 12/14/2009  . Gout 11/01/2009  . Anemia, iron deficiency 11/01/2009  . MYOCARDIAL INFARCTION, HX OF 11/01/2009  . Atrial fibrillation, chronic 11/01/2009  . PERIPHERAL EDEMA 11/01/2009  . Heart valve replaced by other means 11/01/2009  . Presence of other heart-valve replacement 11/01/2009  . Ascending aortic dissection ([place]) 07/14/2008  . 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 Venous (peripheral) insufficiency 02/03/2005    Past Surgical History:  Procedure Laterality Date  . aortic truck    . BENTALL PROCEDURE  1988   25 mm St Jude mechanical valve conduit - Dr Blase Mess at [facility] in [place], [place]  . BIOPSY  06/01/2018   Procedure: BIOPSY;  Surgeon: Jerene Bears, MD;  Location: [facility] ENDOSCOPY;  Service: Gastroenterology;;  . CARDIAC VALVE REPLACEMENT     [phone]  . CHOLECYSTECTOMY  2011  . COLONOSCOPY WITH PROPOFOL N/A 06/17/2016   Procedure: COLONOSCOPY WITH PROPOFOL;  Surgeon: Jerene Bears, MD;  Location: [facility]OS[facility];  Service: Gastroenterology;  Laterality: N/A;  . COLONOSCOPY WITH PROPOFOL N/A 06/01/2018   Procedure: COLONOSCOPY WITH PROPOFOL;  Surgeon: Jerene Bears, MD;  Location: [facility];  Service: Gastroenterology;  Laterality: N/A;  . CORONARY ARTERY BYPASS GRAFT    . LASER ABLATION Left 03/06/2010   leg  . MITRAL VALVE REPLACEMENT Right 01/24/2015   Procedure: Re-Operation, MINIMALLY INVASIVE MITRAL VALVE (MV) REPLACEMENT.;  Surgeon: Rexene Alberts, MD;  Location: [facility];  Service: Open Heart Surgery;   Laterality: Right;  . ORCHIECTOMY  age 67   testicular cancer  . OTOPLASTY     bilateral, age 72  . POLYPECTOMY  06/01/2018   Procedure: POLYPECTOMY;  Surgeon: Jerene Bears, MD;  Location: [facility];  Service: Gastroenterology;;  . TEE WITHOUT CARDIOVERSION N/A 10/11/2014   Procedure: TRANSESOPHAGEAL ECHOCARDIOGRAM (TEE);  Surgeon: Pixie Casino, MD;  Location: [facility] ENDOSCOPY;  Service: Cardiovascular;  Laterality: N/A;  . TEE WITHOUT CARDIOVERSION N/A 01/24/2015   Procedure: TRANSESOPHAGEAL ECHOCARDIOGRAM (TEE);  Surgeon: Valentina Gu  Roxy Manns, MD;  Location: [place];  Service: Open Heart Surgery;  Laterality: N/A;  . TONSILLECTOMY  1967  . TOOTH EXTRACTION  2016        Home Medications    Prior to Admission medications   Medication Sig Start Date End Date Taking? Authorizing Provider  allopurinol (ZYLOPRIM) 300 MG tablet Take 300 mg by mouth daily. 11/17/12  Yes Biagio Borg, MD  aspirin EC 81 MG EC tablet Take 1 tablet (81 mg total) by mouth daily. 02/27/19  Yes [name], MD  bumetanide (BUMEX) 2 MG tablet Take 1 tablet (2 mg total) by mouth 2 (two) times daily. Patient taking differently: Take 4 mg by mouth 2 (two) times daily.  02/27/19  Yes [name], MD  gabapentin (NEURONTIN) 300 MG capsule Take 300 mg by mouth 3 (three) times daily.   Yes [provider]  magnesium oxide (MAG-OX) 400 (241.3 MG) MG tablet Take 1 tablet (400 mg total) by mouth 2 (two) times daily. Patient taking differently: Take 400 mg by mouth 3 (three) times daily.  06/04/15  Yes [name], MD  methocarbamol (ROBAXIN) 500 MG tablet Take 1 tablet (500 mg total) by mouth 3 (three) times daily. 07/30/18 04/01/19 Yes Vevelyn Francois, NP  oxyCODONE (OXY IR/ROXICODONE) 5 MG immediate release tablet Take 1 tablet (5 mg total) by mouth every 8 (eight) hours as needed for severe pain. Patient taking differently: Take 5 mg by mouth 3 (three) times daily.  09/28/18 04/01/19 Yes King, Diona Foley, NP   potassium chloride SA (K-DUR) 20 MEQ tablet Take 2 tablets (40 mEq total) by mouth 3 (three) times daily. 02/27/19  Yes [name], MD  PRESCRIPTION MEDICATION See admin instructions. CPAP- At bedtime and during any time of rest   Yes [provider]  metolazone (ZAROXOLYN) 5 MG tablet Take 10 mg by mouth every morning. 04/26/18 12/16/19  [provider]  metoprolol succinate (TOPROL-XL) 25 MG 24 hr tablet Take 1.5 tablets (37.5 mg total) by mouth 2 (two) times daily. 02/27/19   [name], MD  mupirocin ointment (BACTROBAN) 2 % Apply 1 application topically 2 (two) times daily as needed (infection prevention- both legs).     [provider]  silver sulfADIAZINE (SILVADENE) 1 % cream Apply 1 application topically daily as needed (for wound care- both legs).     [provider]  traZODone (DESYREL) 100 MG tablet Take 100 mg by mouth at bedtime.  01/24/19   [provider]  warfarin (COUMADIN) 5 MG tablet 5 mg daily, and 2.5 mg on Mondays Patient taking differently: Take 5 mg by mouth.  02/27/19   [name], MD    Family History Family History  Problem Relation Age of Onset  . Heart disease Mother   . Throat cancer Mother   . Other Mother        bowel obstruction  . Diabetes Father   . Stroke Other   . Hypertension Other   . Cancer Maternal Aunt        lung, brain  . Cancer Maternal Uncle        brain aneurysm  . Colon cancer Neg Hx     Social History Social History   Tobacco Use  . Smoking status: Former Smoker    Types: Cigars  . Smokeless tobacco: Never Used  . Tobacco comment: about 3 yearly- cigar  Substance Use Topics  . Alcohol use: Yes    Alcohol/week:  0.0 standard drinks    Comment: social  . Drug use: No     Allergies   Penicillins, Sulfamethoxazole-trimethoprim, and Tape   Review of Systems Review of Systems  Constitutional: Negative for fever.  Respiratory: Positive for cough and shortness  of breath.   Cardiovascular: Positive for leg swelling. Negative for chest pain.  Gastrointestinal: Negative for abdominal pain, nausea and vomiting.  Genitourinary: Negative for dysuria and hematuria.  Neurological: Negative for headaches.  All other systems reviewed and are negative.    Physical Exam Updated Vital Signs BP 99/65   Pulse 84   Temp 98.3 F (36.8 C) (Oral)   Resp (!) 23   Ht 5' (1.524 m)   Wt 98.9 kg Comment: pt states he was weighed this morning  SpO2 100%   BMI 42.58 kg/m   Physical Exam Vitals signs and nursing Cook reviewed. Exam conducted with a chaperone present.  Constitutional:      Appearance: Normal appearance. He is well-developed.  HENT:     Head: Normocephalic and atraumatic.  Eyes:     General: Lids are normal.     Conjunctiva/sclera: Conjunctivae normal.     Pupils: Pupils are equal, round, and reactive to light.  Neck:     Musculoskeletal: Full passive range of motion without pain.  Cardiovascular:     Rate and Rhythm: Normal rate. Rhythm irregularly irregular.     Pulses: Normal pulses.     Heart sounds: Normal heart sounds. No murmur. No friction rub. No gallop.   Pulmonary:     Effort: Pulmonary effort is normal.     Breath sounds: Rales present.     Comments: Crackles noted bilaterally to about the mid lung fields.  Speaking in short sentences.  Currently on nasal cannula. Abdominal:     General: There is distension.     Palpations: Abdomen is soft. Abdomen is not rigid.     Tenderness: There is no abdominal tenderness. There is no guarding.     Comments: Mild distention noted to the abdomen.  No rigidity, guarding.  Genitourinary:    Comments: The exam was performed with a chaperone present.  Edema noted to the scrotum.  No tenderness, overlying warmth, erythema. Musculoskeletal: Normal range of motion.     Comments: 3+ pitting edema noted to bilateral lower extremities.  There is some overlying chronic venous stasis changes noted  to the distal tib-fib bilaterally.  No overlying warmth.   Skin:    General: Skin is warm and dry.     Capillary Refill: Capillary refill takes less than 2 seconds.  Neurological:     Mental Status: He is alert and oriented to person, place, and time.  Psychiatric:        Speech: Speech normal.      ED Treatments / Results  Labs (all labs ordered are listed, but only abnormal results are displayed) Labs Reviewed  COMPREHENSIVE METABOLIC PANEL - Abnormal; Notable for the following components:      Result Value   Sodium 128 (*)    Potassium 2.9 (*)    Chloride 83 (*)    CO2 34 (*)    BUN 44 (*)    Calcium 7.6 (*)    Total Protein 4.2 (*)    Albumin 1.7 (*)    AST 14 (*)    All other components within normal limits  CBC WITH DIFFERENTIAL/PLATELET - Abnormal; Notable for the following components:   WBC 11.6 (*)    Hemoglobin 9.6 (*)  HCT 33.0 (*)    MCV 73.2 (*)    MCH 21.3 (*)    MCHC 29.1 (*)    RDW 22.0 (*)    Neutro Abs 11.0 (*)    Lymphs Abs 0.2 (*)    All other components within normal limits  BRAIN NATRIURETIC PEPTIDE - Abnormal; Notable for the following components:   B Natriuretic Peptide 302.1 (*)    All other components within normal limits  PROTIME-INR - Abnormal; Notable for the following components:   Prothrombin Time 37.4 (*)    INR 3.9 (*)    All other components within normal limits  SARS CORONAVIRUS 2  MAGNESIUM  BASIC METABOLIC PANEL    EKG EKG Interpretation  Date/Time:  Friday April 01 2019 21:23:41 EDT Ventricular Rate:  84 PR Interval:    QRS Duration: 167 QT Interval:  454 QTC Calculation: 537 R Axis:   -117 Text Interpretation:  Atrial fibrillation Left bundle branch block Non-specific ST-t changes No significant change since last tracing Confirmed by Lajean Saver [phone]) on 04/01/2019 9:34:03 PM   Radiology Dg Chest 2 View  Result Date: 04/01/2019 CLINICAL DATA:  Shortness of breath EXAM: CHEST - 2 VIEW COMPARISON:  February 13, 2019 FINDINGS: There is a nodular opacity in the right lower lobe measuring 1.4 x 1.0 cm. There is no edema or consolidation. There is cardiomegaly. There is prominence of the central pulmonary arteries with fairly rapid peripheral tapering, findings felt to represent pulmonary arterial hypertension. Patient is status post mitral valve replacement. No adenopathy. No bone lesions. IMPRESSION: 1.4 x 1.0 cm right lower lobe nodular opacity. This finding warrants correlation with noncontrast enhanced chest CT to further evaluate. No edema or consolidation. There is stable cardiomegaly with evident pulmonary arterial hypertension. No adenopathy evident. Patient is status post mitral valve replacement. Electronically Signed   By: Lowella Grip III M.D.   On: 04/01/2019 21:49    Procedures Procedures (including critical care time)  Medications Ordered in ED Medications  potassium chloride 10 mEq in 100 mL IVPB (10 mEq Intravenous New Bag/Given 04/01/19 2343)  allopurinol (ZYLOPRIM) tablet 300 mg (has no administration in time range)  aspirin EC tablet 81 mg (has no administration in time range)  oxyCODONE (Oxy IR/ROXICODONE) immediate release tablet 5 mg (has no administration in time range)  gabapentin (NEURONTIN) capsule 300 mg (has no administration in time range)  methocarbamol (ROBAXIN) tablet 500 mg (has no administration in time range)  magnesium oxide (MAG-OX) tablet 400 mg (has no administration in time range)  potassium chloride SA (K-DUR) CR tablet 40 mEq (has no administration in time range)  silver sulfADIAZINE (SILVADENE) 1 % cream 1 application (has no administration in time range)  mupirocin ointment (BACTROBAN) 2 % 1 application (has no administration in time range)  sodium chloride flush (NS) 0.9 % injection 3 mL (3 mLs Intravenous Not Given 04/02/19 0008)  sodium chloride flush (NS) 0.9 % injection 3 mL (has no administration in time range)  0.9 %  sodium chloride infusion (has no  administration in time range)  acetaminophen (TYLENOL) tablet 650 mg (has no administration in time range)  ondansetron (ZOFRAN) injection 4 mg (has no administration in time range)  potassium chloride 10 mEq in 100 mL IVPB (has no administration in time range)  insulin aspart (novoLOG) injection 0-15 Units (has no administration in time range)  insulin aspart (novoLOG) injection 0-5 Units (has no administration in time range)  furosemide (LASIX) injection 40 mg (40 mg Intravenous  Given 04/01/19 2339)     Initial Impression / Assessment and Plan / ED Course  I have reviewed the triage vital signs and the nursing notes.  Pertinent labs & imaging results that were available during my care of the patient were reviewed by me and considered in my medical decision making (see chart for details).        58 year old male past medical history of CHF who presents for evaluation of shortness of breath and worsening edema.  Recent admission in June 2020 with discharge on 02/27/19.  Since then has had 20 pound weight loss.  Reports worsening shortness of breath over the last few days.  Was given IV Lasix yesterday with some improvement of edema.  Called EMS today when he was having worsening shortness of breath.  On initial EMS arrival, he was satting between 88-90%.  They placed him on 4 L with improvement of his O2 sats.  No oxygen requirement at home.  Initial ED arrival, he is afebrile.  He is satting 100% on 4 L O2.  On exam, he is speaking in shorter sentences.  Concern for CHF exacerbation.  Patient with diffuse peripheral edema that extends all the way to the scrotum.  Plan to check labs, chest x-ray.  BNP elevated at 302.1.  CBC shows leukocytosis of 11.6.  Hemoglobin is 9.6.  CMP shows sodium of 128.  Potassium is 2.9.  Potassium repleted.  BUN and creatinine are stable.  PT/INR is 3.9.  Chest x-ray shows pulmonary nodule.  Given likely CHF exacerbation, new oxygen requirement as well as  shortness of breath, feel that patient warrants admission.  Discussed with cardiology will plan to admit.  Portions of this Cook were generated with [employer]. Dictation errors may occur despite best attempts at proofreading.  Final Clinical Impressions(s) / ED Diagnoses   Final diagnoses:  Acute on chronic congestive heart failure, unspecified heart failure type [facility])  Peripheral edema  Hypokalemia    ED Discharge Orders    None       Desma Mcgregor 04/02/19 0031    Lajean Saver, MD 04/02/19 [phone]

## 2019-04-01 NOTE — ED Notes (Signed)
Delay in lab draw,  Pt not in room 

## 2019-04-02 ENCOUNTER — Encounter (HOSPITAL_COMMUNITY): Payer: Self-pay | Admitting: *Deleted

## 2019-04-02 DIAGNOSIS — I5033 Acute on chronic diastolic (congestive) heart failure: Secondary | ICD-10-CM

## 2019-04-02 DIAGNOSIS — I4819 Other persistent atrial fibrillation: Secondary | ICD-10-CM

## 2019-04-02 LAB — BASIC METABOLIC PANEL
Anion gap: 9 (ref 5–15)
Anion gap: 9 (ref 5–15)
Anion gap: 9 (ref 5–15)
BUN: 45 mg/dL — ABNORMAL HIGH (ref 6–20)
BUN: 44 mg/dL — ABNORMAL HIGH (ref 6–20)
BUN: 41 mg/dL — ABNORMAL HIGH (ref 6–20)
CO2: 35 mmol/L — ABNORMAL HIGH (ref 22–32)
CO2: 35 mmol/L — ABNORMAL HIGH (ref 22–32)
CO2: 37 mmol/L — ABNORMAL HIGH (ref 22–32)
Calcium: 7.8 mg/dL — ABNORMAL LOW (ref 8.9–10.3)
Calcium: 7.8 mg/dL — ABNORMAL LOW (ref 8.9–10.3)
Calcium: 8 mg/dL — ABNORMAL LOW (ref 8.9–10.3)
Chloride: 86 mmol/L — ABNORMAL LOW (ref 98–111)
Chloride: 87 mmol/L — ABNORMAL LOW (ref 98–111)
Chloride: 86 mmol/L — ABNORMAL LOW (ref 98–111)
Creatinine, Ser: 1.12 mg/dL (ref 0.61–1.24)
Creatinine, Ser: 1.15 mg/dL (ref 0.61–1.24)
Creatinine, Ser: 1.06 mg/dL (ref 0.61–1.24)
GFR calc Af Amer: >60 mL/min (ref >60)
GFR calc Af Amer: >60 mL/min (ref >60)
GFR calc Af Amer: >60 mL/min (ref >60)
GFR calc non Af Amer: >60 mL/min (ref >60)
GFR calc non Af Amer: >60 mL/min (ref >60)
GFR calc non Af Amer: >60 mL/min (ref >60)
Glucose, Bld: 109 mg/dL — ABNORMAL HIGH (ref 70–99)
Glucose, Bld: 95 mg/dL (ref 70–99)
Glucose, Bld: 95 mg/dL (ref 70–99)
Potassium: 2.6 mmol/L — CL (ref 3.5–5.1)
Potassium: 3.1 mmol/L — ABNORMAL LOW (ref 3.5–5.1)
Potassium: 3 mmol/L — ABNORMAL LOW (ref 3.5–5.1)
Sodium: 130 mmol/L — ABNORMAL LOW (ref 135–145)
Sodium: 131 mmol/L — ABNORMAL LOW (ref 135–145)
Sodium: 132 mmol/L — ABNORMAL LOW (ref 135–145)

## 2019-04-02 LAB — GLUCOSE, CAPILLARY
Glucose-Capillary: 64 mg/dL — ABNORMAL LOW (ref 70–99)
Glucose-Capillary: 99 mg/dL (ref 70–99)
Glucose-Capillary: 84 mg/dL (ref 70–99)
Glucose-Capillary: 83 mg/dL (ref 70–99)
Glucose-Capillary: 97 mg/dL (ref 70–99)
Glucose-Capillary: 101 mg/dL — ABNORMAL HIGH (ref 70–99)

## 2019-04-02 LAB — PROTIME-INR
INR: 3.6 — ABNORMAL HIGH (ref 0.8–1.2)
Prothrombin Time: 35.6 seconds — ABNORMAL HIGH (ref 11.4–15.2)

## 2019-04-02 LAB — SARS CORONAVIRUS 2 BY RT PCR (HOSPITAL ORDER, PERFORMED IN ~~LOC~~ HOSPITAL LAB): SARS Coronavirus 2: NEGATIVE

## 2019-04-02 LAB — MRSA PCR SCREENING: MRSA by PCR: NEGATIVE

## 2019-04-02 LAB — MAGNESIUM: Magnesium: 1.7 mg/dL (ref 1.7–2.4)

## 2019-04-02 MED ORDER — CLINDAMYCIN HCL 300 MG PO CAPS
450.0000 mg | ORAL_CAPSULE | Freq: Three times a day (TID) | ORAL | Status: DC
  Administered 2019-04-02: 450 mg via ORAL
  Filled 2019-04-02 (×2): qty 1

## 2019-04-02 MED ORDER — POTASSIUM CHLORIDE 10 MEQ/100ML IV SOLN: 10.0000 meq | INTRAVENOUS | Status: AC

## 2019-04-02 MED ORDER — FUROSEMIDE 10 MG/ML IJ SOLN
80.0000 mg | Freq: Once | INTRAMUSCULAR | Status: AC
  Administered 2019-04-02: 12:00:00 80 mg via INTRAVENOUS
  Filled 2019-04-02: qty 8

## 2019-04-02 MED ORDER — INSULIN ASPART 100 UNIT/ML ~~LOC~~ SOLN
0.0000 [IU] | Freq: Every day | SUBCUTANEOUS | Status: DC
  Administered 2019-04-05: 3 [IU] via SUBCUTANEOUS

## 2019-04-02 MED ORDER — WARFARIN - PHARMACIST DOSING INPATIENT
Freq: Every day | Status: DC
  Administered 2019-04-03 – 2019-04-08 (×3)

## 2019-04-02 MED ORDER — WARFARIN SODIUM 2.5 MG PO TABS
2.5000 mg | ORAL_TABLET | Freq: Once | ORAL | Status: AC
  Administered 2019-04-02: 17:00:00 2.5 mg via ORAL
  Filled 2019-04-02: qty 1

## 2019-04-02 MED ORDER — FERROUS SULFATE 325 (65 FE) MG PO TABS
325.0000 mg | ORAL_TABLET | Freq: Three times a day (TID) | ORAL | Status: DC
  Administered 2019-04-02 – 2019-04-11 (×27): 325 mg via ORAL
  Filled 2019-04-02 (×27): qty 1

## 2019-04-02 MED ORDER — INSULIN ASPART 100 UNIT/ML ~~LOC~~ SOLN
0.0000 [IU] | Freq: Three times a day (TID) | SUBCUTANEOUS | Status: DC
  Administered 2019-04-05: 3 [IU] via SUBCUTANEOUS
  Administered 2019-04-06 (×2): 2 [IU] via SUBCUTANEOUS
  Administered 2019-04-07: 3 [IU] via SUBCUTANEOUS
  Administered 2019-04-09: 2 [IU] via SUBCUTANEOUS

## 2019-04-02 MED ORDER — METOLAZONE 2.5 MG PO TABS
2.5000 mg | ORAL_TABLET | Freq: Once | ORAL | Status: AC
  Administered 2019-04-02: 12:00:00 2.5 mg via ORAL
  Filled 2019-04-02: qty 1

## 2019-04-02 MED ORDER — DOXYCYCLINE HYCLATE 100 MG PO TABS
100.0000 mg | ORAL_TABLET | Freq: Two times a day (BID) | ORAL | Status: AC
  Administered 2019-04-02 – 2019-04-08 (×14): 100 mg via ORAL
  Filled 2019-04-02 (×14): qty 1

## 2019-04-02 MED ORDER — POTASSIUM CHLORIDE 10 MEQ/100ML IV SOLN
10.0000 meq | INTRAVENOUS | Status: AC
  Administered 2019-04-02 (×3): 10 meq via INTRAVENOUS
  Filled 2019-04-02 (×3): qty 100

## 2019-04-02 MED ORDER — TRAZODONE HCL 50 MG PO TABS
100.0000 mg | ORAL_TABLET | Freq: Every day | ORAL | Status: DC
  Administered 2019-04-02 – 2019-04-10 (×9): 100 mg via ORAL
  Filled 2019-04-02 (×10): qty 2

## 2019-04-02 MED ORDER — POTASSIUM CHLORIDE CRYS ER 20 MEQ PO TBCR
40.0000 meq | EXTENDED_RELEASE_TABLET | Freq: Once | ORAL | Status: AC
  Administered 2019-04-02: 40 meq via ORAL
  Filled 2019-04-02: qty 2

## 2019-04-02 MED ORDER — FUROSEMIDE 10 MG/ML IJ SOLN
10.0000 mg/h | INTRAVENOUS | Status: DC
  Administered 2019-04-02: 12 mg/h via INTRAVENOUS
  Administered 2019-04-03 – 2019-04-08 (×8): 15 mg/h via INTRAVENOUS
  Administered 2019-04-09: 10 mg/h via INTRAVENOUS
  Administered 2019-04-09: 15 mg/h via INTRAVENOUS
  Administered 2019-04-10: 10 mg/h via INTRAVENOUS
  Filled 2019-04-02: qty 2
  Filled 2019-04-02 (×8): qty 25
  Filled 2019-04-02: qty 21
  Filled 2019-04-02: qty 20
  Filled 2019-04-02: qty 21
  Filled 2019-04-02: qty 25
  Filled 2019-04-02: qty 21
  Filled 2019-04-02 (×2): qty 25

## 2019-04-02 NOTE — Consult Note (Signed)
Advanced Heart Failure Team Consult Note   Primary Physician: [name], MD PCP-Cardiologist:  [name], MD  Reason for Consultation: CHF  HPI:    Lance Cook is seen today for evaluation of CHF at the request of Almyra Deforest.  58 y.o. with history of Bentall procedure with St Jude mechanical aortic alve and conduit in 1988.  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  This procedure was complicated by ARDS and prolonged time on the vent.  He had SVG-RCA with original surgery.  Last cath in 4/16 prior to MV surgery showed patent native coronaries, SVG-RCA likely occluded (never identified).  He has had chronic atrial fibrillation and has history of CVA.   Initially after MV surgery, he did fairly well. 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  He had reasonable quality of life on po Lasix with occasional episodes of fluid retention that were manageable.  However, starting about 3-4 months ago, he began to decompensate and has not responded well to outpatient medication adjustment.  He was admitted in 6/20 and was diuresed with IV Lasix, he lost 20 lbs.  However, after discharge, po diuretics were not effective at managing the re-development of volume overload.  He was initially on high dose Lasix, then transitioned over to Bumex which was most recently increased to 4 mg bid.  Prior to admission, he was taking metolazone 5 mg bid as well.  Despite this (and several doses of IV Lasix at home), weight has gone back up 20 lbs and he is short of breath walking around the house.  Also with orthopnea and significant lower leg swelling with weeping.  He says that he gets chest pain "only with heavy exertion."   He was admitted due to inability to manage volume overload at home.    Review of Systems: All systems reviewed and negative except as per HPI  Home Medications Prior to Admission medications   Medication Sig Start Date End  Date Taking? Authorizing Provider  allopurinol (ZYLOPRIM) 300 MG tablet Take 300 mg by mouth daily. 11/17/12  Yes Biagio Borg, MD  aspirin EC 81 MG EC tablet Take 1 tablet (81 mg total) by mouth daily. 02/27/19  Yes [name], MD  bumetanide (BUMEX) 2 MG tablet Take 1 tablet (2 mg total) by mouth 2 (two) times daily. Patient taking differently: Take 4 mg by mouth 2 (two) times daily.  02/27/19  Yes [name], MD  gabapentin (NEURONTIN) 300 MG capsule Take 300 mg by mouth 3 (three) times daily.   Yes [provider]  magnesium oxide (MAG-OX) 400 (241.3 MG) MG tablet Take 1 tablet (400 mg total) by mouth 2 (two) times daily. Patient taking differently: Take 400 mg by mouth 3 (three) times daily.  06/04/15  Yes [name], MD  methocarbamol (ROBAXIN) 500 MG tablet Take 1 tablet (500 mg total) by mouth 3 (three) times daily. 07/30/18 04/01/19 Yes Vevelyn Francois, NP  oxyCODONE (OXY IR/ROXICODONE) 5 MG immediate release tablet Take 1 tablet (5 mg total) by mouth every 8 (eight) hours as needed for severe pain. Patient taking differently: Take 5 mg by mouth 3 (three) times daily.  09/28/18 04/01/19 Yes King, Diona Foley, NP  potassium chloride SA (K-DUR) 20 MEQ tablet Take 2 tablets (40 mEq total) by mouth 3 (three) times daily. 02/27/19  Yes [name], MD  PRESCRIPTION MEDICATION See admin  instructions. CPAP- At bedtime and during any time of rest   Yes [provider]  metolazone (ZAROXOLYN) 5 MG tablet Take 10 mg by mouth every morning. 04/26/18 12/16/19  [provider]  metoprolol succinate (TOPROL-XL) 25 MG 24 hr tablet Take 1.5 tablets (37.5 mg total) by mouth 2 (two) times daily. 02/27/19   [name], MD  mupirocin ointment (BACTROBAN) 2 % Apply 1 application topically 2 (two) times daily as needed (infection prevention- both legs).     [provider]  silver sulfADIAZINE (SILVADENE) 1 % cream Apply 1 application topically daily as  needed (for wound care- both legs).     [provider]  traZODone (DESYREL) 100 MG tablet Take 100 mg by mouth at bedtime.  01/24/19   [provider]  warfarin (COUMADIN) 5 MG tablet 5 mg daily, and 2.5 mg on Mondays Patient taking differently: Take 5 mg by mouth.  02/27/19   [name], MD    Past Medical History: 1. Bentall procedure 1988 with St Jude mechanical aortic valve and conduit.  2. Severe mitral regurgitation: Mechanical St Jude mitral valve 5/16.  Post-op complicated by ARDS and prolonged time on vent.  3. Chronic atrial fibrillation.  4. H/o CVA 5. OSA: CPAP 6. H/o C difficile 7. Chronic diastolic CHF: Echo (0/62) with EF 55-60%, septal bounce c/w LBBB, RV normal size and systolic function, PASP 62 mmHg, severe biatrial enlargement, mechanical MV with mild peri-valvular regurgitation, mechanical aortic valve with mean gradient 30 mmHg, moderate TR, IVC dilated.  8. CAD: SVG-RCA at time of initial cardiac surgery.   - LHC (4/16): Difficult to engage re-implanted vessels.  SVG-RCA probably occluded, no significant disease in native vessels.  9. Type 2 diabetes 10. Gout  11. H/o testicular cancer  Past Surgical History: Past Surgical History:  Procedure Laterality Date  . aortic truck    . BENTALL PROCEDURE  1988   25 mm St Jude mechanical valve conduit - Dr Blase Mess at [facility] in [place], [place]  . BIOPSY  06/01/2018   Procedure: BIOPSY;  Surgeon: Jerene Bears, MD;  Location: [facility];  Service: Gastroenterology;;  . CARDIAC VALVE REPLACEMENT     [phone]  . CHOLECYSTECTOMY  2011  . COLONOSCOPY WITH PROPOFOL N/A 06/17/2016   Procedure: COLONOSCOPY WITH PROPOFOL;  Surgeon: Jerene Bears, MD;  Location: [facility];  Service: Gastroenterology;  Laterality: N/A;  . COLONOSCOPY WITH PROPOFOL N/A 06/01/2018   Procedure: COLONOSCOPY WITH PROPOFOL;  Surgeon: Jerene Bears, MD;  Location: [facility];  Service: Gastroenterology;   Laterality: N/A;  . CORONARY ARTERY BYPASS GRAFT    . LASER ABLATION Left 03/06/2010   leg  . MITRAL VALVE REPLACEMENT Right 01/24/2015   Procedure: Re-Operation, MINIMALLY INVASIVE MITRAL VALVE (MV) REPLACEMENT.;  Surgeon: Rexene Alberts, MD;  Location: [place];  Service: Open Heart Surgery;  Laterality: Right;  . ORCHIECTOMY  age 89   testicular cancer  . OTOPLASTY     bilateral, age 46  . POLYPECTOMY  06/01/2018   Procedure: POLYPECTOMY;  Surgeon: Jerene Bears, MD;  Location: [facility];  Service: Gastroenterology;;  . TEE WITHOUT CARDIOVERSION N/A 10/11/2014   Procedure: TRANSESOPHAGEAL ECHOCARDIOGRAM (TEE);  Surgeon: Pixie Casino, MD;  Location: [facility] ENDOSCOPY;  Service: Cardiovascular;  Laterality: N/A;  . TEE WITHOUT CARDIOVERSION N/A 01/24/2015   Procedure: TRANSESOPHAGEAL ECHOCARDIOGRAM (TEE);  Surgeon: Rexene Alberts, MD;  Location: [place];  Service: Open Heart Surgery;  Laterality: N/A;  .  TONSILLECTOMY  1967  . TOOTH EXTRACTION  2016    Family History: Family History  Problem Relation Age of Onset  . Heart disease Mother   . Throat cancer Mother   . Other Mother        bowel obstruction  . Diabetes Father   . Stroke Other   . Hypertension Other   . Cancer Maternal Aunt        lung, brain  . Cancer Maternal Uncle        brain aneurysm  . Colon cancer Neg Hx     Social History: Social History   Socioeconomic History  . Marital status: Married    Spouse name: Not on file  . Number of children: 0  . Years of education: Not on file  . Highest education level: Not on file  Occupational History  . Occupation: Disabled Environmental education officer: UNEMPLOYED  Social Needs  . Financial resource strain: Not on file  . Food insecurity    Worry: Not on file    Inability: Not on file  . Transportation needs    Medical: Not on file    Non-medical: Not on file  Tobacco Use  . Smoking status: Former Smoker    Types: Cigars  . Smokeless tobacco: Never Used  . Tobacco  comment: about 3 yearly- cigar  Substance and Sexual Activity  . Alcohol use: Yes    Alcohol/week: 0.0 standard drinks    Comment: social  . Drug use: No  . Sexual activity: Not on file  Lifestyle  . Physical activity    Days per week: Not on file    Minutes per session: Not on file  . Stress: Not on file  Relationships  . Social Herbalist on phone: Not on file    Gets together: Not on file    Attends religious service: Not on file    Active member of club or organization: Not on file    Attends meetings of clubs or organizations: Not on file    Relationship status: Not on file  Other Topics Concern  . Not on file  Social History Narrative  . Not on file    Allergies:  Allergies  Allergen Reactions  . Penicillins Hives and Rash    Has patient had a PCN reaction causing immediate rash, facial/tongue/throat swelling, SOB or lightheadedness with hypotension: Yes Has patient had a PCN reaction causing severe rash involving mucus membranes or skin necrosis: Yes Has patient had a PCN reaction that required hospitalization unsure Has patient had a PCN reaction occurring within the last 10 years: 2010-2012 If all of the above answers are "NO", then may proceed with Cephalosporin use.  [name] Other (See Comments)    CDIFF, when taking oral tablets   . Tape Other (See Comments) and Itching    Skin irritation    Objective:    Vital Signs:   Temp:  [97.6 F (36.4 C)-98.7 F (37.1 C)] 98.7 F (37.1 C) (08/01 0745) Pulse Rate:  [68-92] 77 (08/01 0333) Resp:  [20-25] 21 (08/01 0333) BP: (97-118)/(62-74) 97/68 (08/01 0333) SpO2:  [93 %-100 %] 100 % (08/01 0333) Weight:  [97.2 kg-98.9 kg] 97.2 kg (08/01 0154) Last BM Date: 04/01/19  Weight change: Filed Weights   04/01/19 2340 04/02/19 0154  Weight: 98.9 kg 97.2 kg    Intake/Output:   Intake/Output Summary (Last 24 hours) at 04/02/2019 1104 Last data filed at 04/02/2019 0600 Gross per  24 hour  Intake 371.07 ml  Output 925 ml  Net -553.93 ml      Physical Exam    General:  Well appearing. No resp difficulty HEENT: normal Neck: supple. JVP 14-16 cm. Carotids 2+ bilat; no bruits. No lymphadenopathy or thyromegaly appreciated. Cor: PMI nondisplaced. Irregular rate & rhythm. Mechanical S1 and S2, 3/6 HSM LLSB/apex. Lungs: clear Abdomen: soft, nontender, nondistended. No hepatosplenomegaly. No bruits or masses. Good bowel sounds. Extremities: no cyanosis, clubbing, rash. 2+ edema to thighs.  Lower legs with weeping and suspect venous stasis ulcers.  Neuro: alert & orientedx3, cranial nerves grossly intact. moves all 4 extremities w/o difficulty. Affect pleasant   Telemetry   Atrial fibrillation with rate in 80s (personally reviewed)  EKG    Atrial fibrillation, LBBB (personally reviewed)  Labs   Basic Metabolic Panel: Recent Labs  Lab 04/01/19 November 26, 2155 04/02/19 0231  NA 128* 130*  K 2.9* 2.6*  CL 83* 86*  CO2 34* 35*  GLUCOSE 94 109*  BUN 44* 45*  CREATININE 1.21 1.12  CALCIUM 7.6* 7.8*  MG  --  1.7    Liver Function Tests: Recent Labs  Lab 04/01/19 2157  AST 14*  ALT 9  ALKPHOS 57  BILITOT 0.8  PROT 4.2*  ALBUMIN 1.7*   No results for input(s): LIPASE, AMYLASE in the last 168 hours. No results for input(s): AMMONIA in the last 168 hours.  CBC: Recent Labs  Lab 04/01/19 2157  WBC 11.6*  NEUTROABS 11.0*  HGB 9.6*  HCT 33.0*  MCV 73.2*  PLT 264    Cardiac Enzymes: No results for input(s): CKTOTAL, CKMB, CKMBINDEX, TROPONINI in the last 168 hours.  BNP: BNP (last 3 results) Recent Labs    02/13/19 1504 04/01/19 2157  BNP 223.8* 302.1*    ProBNP (last 3 results) No results for input(s): PROBNP in the last 8760 hours.   CBG: Recent Labs  Lab 04/02/19 0151 04/02/19 0355 04/02/19 0636  GLUCAP 64* 99 84    Coagulation Studies: Recent Labs    04/01/19 26-Nov-2155 04/02/19 0231  LABPROT 37.4* 35.6*  INR 3.9* 3.6*      Imaging   Dg Chest 2 View  Result Date: 04/01/2019 CLINICAL DATA:  Shortness of breath EXAM: CHEST - 2 VIEW COMPARISON:  February 13, 2019 FINDINGS: There is a nodular opacity in the right lower lobe measuring 1.4 x 1.0 cm. There is no edema or consolidation. There is cardiomegaly. There is prominence of the central pulmonary arteries with fairly rapid peripheral tapering, findings felt to represent pulmonary arterial hypertension. Patient is status post mitral valve replacement. No adenopathy. No bone lesions. IMPRESSION: 1.4 x 1.0 cm right lower lobe nodular opacity. This finding warrants correlation with noncontrast enhanced chest CT to further evaluate. No edema or consolidation. There is stable cardiomegaly with evident pulmonary arterial hypertension. No adenopathy evident. Patient is status post mitral valve replacement. Electronically Signed   By: Lowella Grip III M.D.   On: 04/01/2019 21:49      Medications:     Current Medications: . allopurinol  300 mg Oral Daily  . aspirin EC  81 mg Oral Daily  . clindamycin  450 mg Oral Q8H  . ferrous sulfate  325 mg Oral TID WC  . furosemide  80 mg Intravenous Once  . gabapentin  300 mg Oral TID  . insulin aspart  0-15 Units Subcutaneous TID WC  . insulin aspart  0-5 Units Subcutaneous QHS  . magnesium oxide  400 mg Oral  BID  . methocarbamol  500 mg Oral Q8H  . metolazone  2.5 mg Oral Once  . potassium chloride SA  40 mEq Oral TID  . potassium chloride  40 mEq Oral Once  . sodium chloride flush  3 mL Intravenous Q12H  . traZODone  100 mg Oral QHS  . warfarin  2.5 mg Oral ONCE-1800  . Warfarin - Pharmacist Dosing Inpatient   Does not apply q1800     Infusions: . sodium chloride    . furosemide (LASIX) infusion        Assessment/Plan   1. Acute on chronic diastolic CHF:  Last echo in 6/20 showed preserved EF 55-60% with septal bounce from LBBB. Mean gradient was elevated across aortic valve at 30 mmHg (though not markedly  elevated), and there was suggestion of mild peri-valvular regurgitation at mitral position.  He has significantly decompensated over the last several months, now with marked diuretic resistance.  His atrial fibrillation is not new.  I am worried that he may have issues with his mechanical valves (?worse obstruction across the aortic valve than the TTE revealed or worse mitral peri-valvular regurgitation than the TTE revealed).  He has a more prominent murmur than I would expect.  This may be TR but it extends to the apex. On exam, he is markedly volume overloaded with hypokalemia and hyponatremia in setting of his doses of Bumex and metolazone at home.  - I will give Lasix 80 mg IV x 1 then 12 mg/hr infusion.  I will give metolazone 2.5 mg x 1.  Replace K aggressively. - I will arrange for TEE on Monday to more closely assess the mechanical aortic and mitral valves.  - Repeat BMET in afternoon for K.  - Will likely need RHC after diuresis.  - Unna boots.  2. Valvular heart disease: Patient has mechanical mitral and aortic valves.  As above, I am concerned that his decompensation could be due to valvular dysfunction, especially with prominent systolic murmur (may all be TR with radiation but ?significant peri-valvular MR).   - TEE on Monday.  - Continue ASA 81 and warfarin with INR goal 2.5-3.5.  3. Atrial fibrillation: Chronic.  Rate is reasonably controlled.  4. CAD: Last cath in 4/16 with patent native coronaries, probably occluded SVG-RCA.  No regional wall motion abnormalities noted on last echo, doubt CAD as cause of his decompensation.  5. Venous stasis ulceration: Noted on lower legs.  - He was started on clindamycin yesterday for ?of co-existing cellulitis.  Given history of recurrent C difficile, I will stop this and use doxycycline for now instead.  - Wound care consult.  6. Type 2 diabetes: SSI.  7. OSA: CPAP.   Length of Stay: 1  Loralie Champagne, MD  04/02/2019, 11:04 AM  Advanced Heart  Failure Team Pager [phone] (M-F; 7a - 4p)  Please contact [facility] for night-coverage after hours (4p -7a ) and weekends on [email or web address]

## 2019-04-02 NOTE — Progress Notes (Signed)
Orthopedic Tech Progress Note Patient Details:  Lance Cook Mar 18, 1961 [id number]  Ortho Devices Type of Ortho Device: Haematologist Ortho Device/Splint Location: bilateral Ortho Device/Splint Interventions: Adjustment, Application, Ordered   Post Interventions Patient Tolerated: Well Instructions Provided: Care of device, Adjustment of device   [name] 04/02/2019, 1:30 PM

## 2019-04-02 NOTE — Progress Notes (Signed)
OT Cancellation Note  Patient Details Name: Lance Cook MRN: [id number] DOB: June 25, 1961   Cancelled Treatment:    Reason Eval/Treat Not Completed: Other (comment). Pt on phone discussion his medications at home with someone. Will try back as schedule permits for evaluation.  [place], OTR/L Acute Rehab Services Pager [phone] Office [phone]     Almon Register 04/02/2019, 1:54 PM

## 2019-04-02 NOTE — Evaluation (Signed)
Physical Therapy Evaluation Patient Details Name: Lance Cook MRN: [id number] DOB: 1961/01/20 Today's Date: 04/02/2019   History of Present Illness  58 yo admitted with HF exacerbation. PMHx: CHF, AVR, MVR, CAD, AFib, AA dissection, DM, HTN, PVD, testicular CA, CVA at 58yo  Clinical Impression  Pt very pleasant and familiar from last admission. Pt reports having just gotten back in bed from standing to use urinal feeling very fatigued. Pt on 3L [place] on arrival with SpO2 100% with O2 decreased to 2L and sats maintained a 100%. Pt with HR 90-94 throughout which is elevated from pt baseline with pt reporting feeling SOB with all lower body movement. Pt declined mobility to EOB or OOB today but agreeable to bed level HEP. Pt educated for HEP and encouraged to progress transfers to chair and gait with nursing assist. Pt with decreased strength, transfers, function and mobility who will benefit from acute therapy to maximize mobility, safety and function to decrease burden of care.      Follow Up Recommendations Home health PT    Equipment Recommendations  None recommended by PT    Recommendations for Other Services       Precautions / Restrictions Precautions Precautions: Fall      Mobility  Bed Mobility               General bed mobility comments: pt declined mobility other than moving toward HOB. Pt able to pull self to [facility] with bil rails mod I  Transfers                    Ambulation/Gait                Stairs            Wheelchair Mobility    Modified Rankin (Stroke Patients Only)       Balance                                             Pertinent Vitals/Pain Pain Assessment: 0-10 Pain Score: 5  Pain Location: legs and back Pain Descriptors / Indicators: Aching Pain Intervention(s): Limited activity within patient's tolerance;Premedicated before session    Home Living Family/patient expects to be discharged to:: Private  residence Living Arrangements: Spouse/significant other Available Help at Discharge: Family;Available 24 hours/day Type of Home: House Home Access: Stairs to enter Entrance Stairs-Rails: Right Entrance Stairs-Number of Steps: 5 Home Layout: One level Home Equipment: Walker - 2 wheels;Shower seat;Wheelchair - manual;Hand held shower head Additional Comments: wife is visually impaired    Prior Function Level of Independence: Needs assistance   Gait / Transfers Assistance Needed: pt furniture walks or uses RW and has limited gait tolerance due to edema and back pain. Wife at times will assist pulling him up from bed or chair  ADL's / Homemaking Assistance Needed: wife assists with LB bathing and dressing, dries pt after showering and does much of the IADL        Hand Dominance        Extremity/Trunk Assessment   Upper Extremity Assessment Upper Extremity Assessment: Defer to OT evaluation    Lower Extremity Assessment Lower Extremity Assessment: Generalized weakness(limited ROM due to edema and body habitus, bil LE edema with blistering)    Cervical / Trunk Assessment Cervical / Trunk Assessment: Kyphotic  Communication   Communication: No difficulties  Cognition Arousal/Alertness:  Awake/alert Behavior During Therapy: WFL for tasks assessed/performed Overall Cognitive Status: Within Functional Limits for tasks assessed                                        General Comments      Exercises General Exercises - Lower Extremity Ankle Circles/Pumps: AROM;20 reps;Both;Supine Heel Slides: AROM;10 reps;Both;Supine Hip ABduction/ADduction: AROM;20 reps;Both;Supine Straight Leg Raises: AROM;15 reps;Both;Supine   Assessment/Plan    PT Assessment Patient needs continued PT services  PT Problem List Decreased strength;Decreased mobility;Decreased activity tolerance;Decreased range of motion;Cardiopulmonary status limiting activity;Decreased skin integrity        PT Treatment Interventions DME instruction;Therapeutic activities;Gait training;Therapeutic exercise;Patient/family education;Stair training;Functional mobility training    PT Goals (Current goals can be found in the Care Plan section)  Acute Rehab PT Goals Patient Stated Goal: return home with my wife PT Goal Formulation: With patient Time For Goal Achievement: 04/16/19 Potential to Achieve Goals: Fair    Frequency Min 3X/week   Barriers to discharge        Co-evaluation               AM-PAC PT "6 Clicks" Mobility  Outcome Measure Help needed turning from your back to your side while in a flat bed without using bedrails?: A Little Help needed moving from lying on your back to sitting on the side of a flat bed without using bedrails?: A Little Help needed moving to and from a bed to a chair (including a wheelchair)?: A Little Help needed standing up from a chair using your arms (e.g., wheelchair or bedside chair)?: A Little Help needed to walk in hospital room?: A Lot Help needed climbing 3-5 steps with a railing? : Total 6 Click Score: 15    End of Session Equipment Utilized During Treatment: Oxygen Activity Tolerance: Patient tolerated treatment well Patient left: in bed;with call bell/phone within reach Nurse Communication: Mobility status PT Visit Diagnosis: Other abnormalities of gait and mobility (R26.89);Muscle weakness (generalized) (M62.81)    Time: 8588-5027 PT Time Calculation (min) (ACUTE ONLY): 18 min   Charges:   PT Evaluation $PT Eval Moderate Complexity: 1 Mod          [place], PT Acute Rehabilitation Services Pager: [phone] Office: [place] 04/02/2019, 1:59 PM

## 2019-04-02 NOTE — Progress Notes (Signed)
Latest k level-2.6 at 6am. As per report pt. received 4 runs of potassium and  potassium level was drawn while  Potassium runs was on going. Discussed latest order of potassium runs ordered  with NP, to hold for now  and repeat bmet ordered. Been calling lab for 6x and no one is answering the cal,.to continue notifying lab.

## 2019-04-02 NOTE — ED Notes (Signed)
ED TO INPATIENT HANDOFF REPORT  ED Nurse Name and Phone #: [phone]  S Name/Age/Gender Lance Cook 58 y.o. male Room/Bed: 040C/040C  Code Status   Code Status: Full Code  Home/SNF/Other Home Patient oriented to: self, place, time and situation Is this baseline? Yes   Triage Complete: Triage complete  Chief Complaint SHOB,Edema    Triage Note Pt brought in by [facility] from home for chest/abdomen/extemity swelling. Pt recently D/C'd on the 17th of July, endorses 20lb weight gain since then. Pt states he received 80mg  lasix by PCP yesterday, endorses increase in urine output but not changes in his weight. Pt in afib on ECG, hx same. Pt RA O2 90%, 100% on 4L. Pt does not typically require home O2. Pt in NAD on arrival to ED.   Allergies Allergies  Allergen Reactions  . Penicillins Hives and Rash    Has patient had a PCN reaction causing immediate rash, facial/tongue/throat swelling, SOB or lightheadedness with hypotension: Yes Has patient had a PCN reaction causing severe rash involving mucus membranes or skin necrosis: Yes Has patient had a PCN reaction that required hospitalization unsure Has patient had a PCN reaction occurring within the last 10 years: 2010-2012 If all of the above answers are "NO", then may proceed with Cephalosporin use.  [name] Other (See Comments)    CDIFF, when taking oral tablets   . Tape Other (See Comments) and Itching    Skin irritation    Level of Care/Admitting Diagnosis ED Disposition    ED Disposition Condition Comment   Admit  Hospital Area: [facility] [[id number]]  Level of Care: Progressive [102]  Covid Evaluation: Asymptomatic Screening Protocol (No Symptoms)  Diagnosis: Acute on chronic diastolic (congestive) heart failure [facility]) [[id number]]  Admitting Physician: Bryna Colander [phone]  Attending Physician: Satira Sark [[id number]]  Estimated length of stay: past midnight tomorrow  Certification:: I certify this patient will need inpatient services for at least 2 midnights  PT Class (Do Not Modify): Inpatient [101]  PT Acc Code (Do Not Modify): Private [1]       B Medical/Surgery History Past Medical History:  Diagnosis Date  . Acute on chronic diastolic CHF (congestive heart failure) ([place]) 08/27/2016  . Anemia   . Anxiety   . ARDS (adult respiratory distress syndrome) ([place]) 01/27/2015  . Arthritis   . Ascending aortic dissection ([place]) 07/14/2008   Localized dissection of ascending aorta noted on CTA in 2009 and stable on CTA in 2011  . Asymptomatic chronic venous hypertension 01/15/2010   Overview:  Overview:  Qualifier: Diagnosis of  By: [name] MD, [name]   Last Assessment & Plan:  I suggested he go to a vein clinic to see if he could be qualified compression hose of some type, since he is not a surgical candidate according to the vascular surgeons.   . Atrial fibrillation ([place])    chronic persistent  . Bell's palsy   . C. difficile diarrhea   . CAD (coronary artery disease)    Old scar inferior wall myoview, 10/2009 EF 52%.  He did have previous SVG to RCA but no obstructive disease noted on his most recent cath.  SVG occluded.   . Cellulitis 04/02/2014  . Cerebral artery occlusion with cerebral infarction ([place]) 10/08/2011   Overview:  Overview:  And hx of TIA prior to CABG, all thought due to systemic emboli prior to coumadin   . Chronic diastolic heart failure ([place]) 03/01/2015   a. 12/2015: echo showing a preserved  EF of 65-70%, moderate AS, and moderate TR.   Marland Kitchen Chronic LBP 10/08/2011  . CVA (cerebral vascular accident) ([place]) 10/08/2011   And hx of TIA prior to CABG, all thought due to systemic emboli prior to coumadin   . DIABETES MELLITUS, TYPE II 11/01/2009   Qualifier: Diagnosis of  By: [name] MD, [name]    . Diverticulosis   . ED (erectile dysfunction) of organic origin 10/08/2011   Overview:  Last Assessment & Plan:  S/p unilateral orchiectomy, for  testosterone level, consider androgel pump 1.62    . Encephalopathy acute 02/05/2015  . Gastric polyps   . GERD (gastroesophageal reflux disease)    not needing medication at thhis time- 01/22/15  . Gout   . H/O mechanical aortic valve replacement 11/01/2009   Qualifier: Diagnosis of  By: [name], [name]    . Hiatal hernia   . History of colon polyps 12/23/2011   Overview:  Overview:  Colonoscopy January 2010, 6 mm rectal tubulovillous adenoma. No high-grade dysplasia   . HTN (hypertension) 02/23/2014  . Hyperlipidemia   . Hypertension   . Hypogonadism male 04/08/2012  . Impaired glucose tolerance 10/08/2011  . Morbid obesity ([place]) 04/02/2014  . Myocardial infarction [facility]) age 64  . OSA (obstructive sleep apnea)    CPAP  . Peripheral vascular disease ([place])   . 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 12/26/8339   33 mm St Jude bileaflet mechanical valve placed via right mini thoracotomy approach  . 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    1988 - Dr Blase Mess at [facility] in [place], [place]  . Severe mitral regurgitation 10/11/2014  . Testicular cancer [facility])    He was 58 y/o. He had surgical resection and rad tx's.   Marland Kitchen TIA (transient ischemic attack)    age 77  . Tubulovillous adenoma of colon   . Varicose veins   . 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 Venous (peripheral) insufficiency 02/03/2005   Past Surgical History:  Procedure Laterality Date  . aortic truck    . BENTALL PROCEDURE  1988   25 mm St Jude mechanical valve conduit - Dr Blase Mess at [facility] in [place], [place]  . BIOPSY  06/01/2018   Procedure: BIOPSY;  Surgeon: Jerene Bears, MD;  Location: [facility];  Service: Gastroenterology;;  . CARDIAC VALVE REPLACEMENT     [phone]  . CHOLECYSTECTOMY  2011  . COLONOSCOPY WITH PROPOFOL N/A 06/17/2016   Procedure: COLONOSCOPY WITH PROPOFOL;  Surgeon: Jerene Bears, MD;   Location: [facility];  Service: Gastroenterology;  Laterality: N/A;  . COLONOSCOPY WITH PROPOFOL N/A 06/01/2018   Procedure: COLONOSCOPY WITH PROPOFOL;  Surgeon: Jerene Bears, MD;  [facility];  Service: Gastroenterology;  Laterality: N/A;  . CORONARY ARTERY BYPASS GRAFT    . LASER ABLATION Left 03/06/2010   leg  . MITRAL VALVE REPLACEMENT Right 01/24/2015   Procedure: Re-Operation, MINIMALLY INVASIVE MITRAL VALVE (MV) REPLACEMENT.;  Surgeon: Rexene Alberts, MD;  Location: [facility];  Service: Open Heart Surgery;  Laterality: Right;  . ORCHIECTOMY  age 64   testicular cancer  . OTOPLASTY     bilateral, age 22  . POLYPECTOMY  06/01/2018   Procedure: POLYPECTOMY;  Surgeon: Jerene Bears, MD;  [facility];  Service: Gastroenterology;;  . TEE WITHOUT CARDIOVERSION N/A 10/11/2014   Procedure: TRANSESOPHAGEAL ECHOCARDIOGRAM (TEE);  Surgeon: Pixie Casino, MD;  Location: [facility]  ENDOSCOPY;  Service: Cardiovascular;  Laterality: N/A;  . TEE WITHOUT CARDIOVERSION N/A 01/24/2015   Procedure: TRANSESOPHAGEAL ECHOCARDIOGRAM (TEE);  Surgeon: Rexene Alberts, MD;  Location: [facility];  Service: Open Heart Surgery;  Laterality: N/A;  . TONSILLECTOMY  1967  . TOOTH EXTRACTION  2016     A IV Location/Drains/Wounds Patient Lines/Drains/Airways Status   Active Line/Drains/Airways    Name:   Placement date:   Placement time:   Site:   Days:   Peripheral IV 04/01/19 Right Antecubital   04/01/19    -    Antecubital   1   Pressure Injury 02/13/19 Buttocks Mid Stage II -  Partial thickness loss of dermis presenting as a shallow open ulcer with a red, pink wound bed without slough.   02/13/19    2030     48          Intake/Output Last 24 hours No intake or output data in the 24 hours ending 04/02/19 0046  Labs/Imaging Results for orders placed or performed during the hospital encounter of 04/01/19 (from the past 48 hour(s))  Comprehensive metabolic panel     Status: Abnormal   Collection  Time: 04/01/19  9:57 PM  Result Value Ref Range   Sodium 128 (L) 135 - 145 mmol/L   Potassium 2.9 (L) 3.5 - 5.1 mmol/L   Chloride 83 (L) 98 - 111 mmol/L   CO2 34 (H) 22 - 32 mmol/L   Glucose, Bld 94 70 - 99 mg/dL   BUN 44 (H) 6 - 20 mg/dL   Creatinine, Ser 1.21 0.61 - 1.24 mg/dL   Calcium 7.6 (L) 8.9 - 10.3 mg/dL   Total Protein 4.2 (L) 6.5 - 8.1 g/dL   Albumin 1.7 (L) 3.5 - 5.0 g/dL   AST 14 (L) 15 - 41 U/L   ALT 9 0 - 44 U/L   Alkaline Phosphatase 57 38 - 126 U/L   Total Bilirubin 0.8 0.3 - 1.2 mg/dL   GFR calc non Af Amer >60 >60 mL/min   GFR calc Af Amer >60 >60 mL/min   Anion gap 11 5 - 15    Comment: Performed at [facility], [address], [place], [place] [address]  CBC with Differential     Status: Abnormal   Collection Time: 04/01/19  9:57 PM  Result Value Ref Range   WBC 11.6 (H) 4.0 - 10.5 K/uL   RBC 4.51 4.22 - 5.81 MIL/uL   Hemoglobin 9.6 (L) 13.0 - 17.0 g/dL   HCT 33.0 (L) 39.0 - 52.0 %   MCV 73.2 (L) 80.0 - 100.0 fL   MCH 21.3 (L) 26.0 - 34.0 pg   MCHC 29.1 (L) 30.0 - 36.0 g/dL   RDW 22.0 (H) 11.5 - 15.5 %   Platelets 264 150 - 400 K/uL   nRBC 0.0 0.0 - 0.2 %   Neutrophils Relative % 95 %   Neutro Abs 11.0 (H) 1.7 - 7.7 K/uL   Lymphocytes Relative 2 %   Lymphs Abs 0.2 (L) 0.7 - 4.0 K/uL   Monocytes Relative 2 %   Monocytes Absolute 0.2 0.1 - 1.0 K/uL   Eosinophils Relative 1 %   Eosinophils Absolute 0.1 0.0 - 0.5 K/uL   Basophils Relative 0 %   Basophils Absolute 0.0 0.0 - 0.1 K/uL   nRBC 0 0 /100 WBC   Abs Immature Granulocytes 0.00 0.00 - 0.07 K/uL   Polychromasia PRESENT    Ovalocytes PRESENT  Comment: Performed at [facility], [place] [address], [place], [place] [address]  Brain natriuretic peptide     Status: Abnormal   Collection Time: 04/01/19  9:57 PM  Result Value Ref Range   B Natriuretic Peptide 302.1 (H) 0.0 - 100.0 pg/mL    Comment: Performed at [facility] [address], [place], [place] [address]  Protime-INR      Status: Abnormal   Collection Time: 04/01/19  9:57 PM  Result Value Ref Range   Prothrombin Time 37.4 (H) 11.4 - 15.2 seconds   INR 3.9 (H) 0.8 - 1.2    Comment: (NOTE) INR goal varies based on device and disease states. Performed at [facility], [place] [address] [place], [place] [address]    Dg Chest 2 View  Result Date: 04/01/2019 CLINICAL DATA:  Shortness of breath EXAM: CHEST - 2 VIEW COMPARISON:  February 13, 2019 FINDINGS: There is a nodular opacity in the right lower lobe measuring 1.4 x 1.0 cm. There is no edema or consolidation. There is cardiomegaly. There is prominence of the central pulmonary arteries with fairly rapid peripheral tapering, findings felt to represent pulmonary arterial hypertension. Patient is status post mitral valve replacement. No adenopathy. No bone lesions. IMPRESSION: 1.4 x 1.0 cm right lower lobe nodular opacity. This finding warrants correlation with noncontrast enhanced chest CT to further evaluate. No edema or consolidation. There is stable cardiomegaly with evident pulmonary arterial hypertension. No adenopathy evident. Patient is status post mitral valve replacement. Electronically Signed   By: Lowella Grip III M.D.   On: 04/01/2019 21:49    Pending Labs Unresulted Labs (From admission, onward)    Start     Ordered   04/02/19 0500  Magnesium  Tomorrow morning,   R     04/01/19 2359   04/02/19 2355  Basic metabolic panel  Daily,   R     04/01/19 2359   04/02/19 0003  SARS Coronavirus 2 [facility] order, Performed in [place] hospital lab)  Once,   R     04/02/19 0003          Vitals/Pain Today's Vitals   04/01/19 2115 04/01/19 2200 04/01/19 2340 04/02/19 0015  BP: 103/62 105/66  99/65  Pulse: 87 86  84  Resp: (!) 25 (!) 24  (!) 23  Temp:      TempSrc:      SpO2: 100% 100%  100%  Weight:   98.9 kg   Height:   5' (1.524 m)     Isolation Precautions No active isolations  Medications Medications  allopurinol (ZYLOPRIM)  tablet 300 mg (has no administration in time range)  aspirin EC tablet 81 mg (has no administration in time range)  oxyCODONE (Oxy IR/ROXICODONE) immediate release tablet 5 mg (has no administration in time range)  gabapentin (NEURONTIN) capsule 300 mg (has no administration in time range)  methocarbamol (ROBAXIN) tablet 500 mg (has no administration in time range)  magnesium oxide (MAG-OX) tablet 400 mg (has no administration in time range)  potassium chloride SA (K-DUR) CR tablet 40 mEq (has no administration in time range)  silver sulfADIAZINE (SILVADENE) 1 % cream 1 application (has no administration in time range)  mupirocin ointment (BACTROBAN) 2 % 1 application (has no administration in time range)  sodium chloride flush (NS) 0.9 % injection 3 mL (3 mLs Intravenous Not Given 04/02/19 0008)  sodium chloride flush (NS) 0.9 % injection 3 mL (has no administration in time range)  0.9 %  sodium chloride infusion (has no administration in time range)  acetaminophen (TYLENOL) tablet 650 mg (has no administration in time range)  ondansetron (ZOFRAN) injection 4 mg (has no administration in time range)  potassium chloride 10 mEq in 100 mL IVPB (has no administration in time range)  insulin aspart (novoLOG) injection 0-15 Units (has no administration in time range)  insulin aspart (novoLOG) injection 0-5 Units (has no administration in time range)  potassium chloride 10 mEq in 100 mL IVPB (10 mEq Intravenous New Bag/Given 04/01/19 2343)  furosemide (LASIX) injection 40 mg (40 mg Intravenous Given 04/01/19 2339)    Mobility walks with device High fall risk   Focused Assessments    R Recommendations: See Admitting Provider Note  Report given to:   Additional Notes:

## 2019-04-02 NOTE — Progress Notes (Signed)
[facility] for Coumadin Indication: mechanical valve/Afib  Allergies  Allergen Reactions  . Penicillins Hives and Rash    Has patient had a PCN reaction causing immediate rash, facial/tongue/throat swelling, SOB or lightheadedness with hypotension: Yes Has patient had a PCN reaction causing severe rash involving mucus membranes or skin necrosis: Yes Has patient had a PCN reaction that required hospitalization unsure Has patient had a PCN reaction occurring within the last 10 years: 2010-2012 If all of the above answers are "NO", then may proceed with Cephalosporin use.  [name] Other (See Comments)    CDIFF, when taking oral tablets   . Tape Other (See Comments) and Itching    Skin irritation    Patient Measurements: Height: 5' (152.4 cm) Weight: 214 lb 4.6 oz (97.2 kg) IBW/kg (Calculated) : 50  Vital Signs: Temp: 98.7 F (37.1 C) (08/01 0745) Temp Source: Oral (08/01 0745) BP: 97/68 (08/01 0333) Pulse Rate: 77 (08/01 0333)  Labs: Recent Labs    04/01/19 2157 04/02/19 0231  HGB 9.6*  --   HCT 33.0*  --   PLT 264  --   LABPROT 37.4* 35.6*  INR 3.9* 3.6*  CREATININE 1.21 1.12    Estimated Creatinine Clearance: 70.9 mL/min (by C-G formula based on SCr of 1.12 mg/dL).   Medical History: Past Medical History:  Diagnosis Date  . Acute on chronic diastolic CHF (congestive heart failure) ([place]) 08/27/2016  . Anemia   . Anxiety   . ARDS (adult respiratory distress syndrome) ([place]) 01/27/2015  . Arthritis   . Ascending aortic dissection ([place]) 07/14/2008   Localized dissection of ascending aorta noted on CTA in 2009 and stable on CTA in 2011  . Asymptomatic chronic venous hypertension 01/15/2010   Overview:  Overview:  Qualifier: Diagnosis of  By: [name] MD, [name]   Last Assessment & Plan:  I suggested he go to a vein clinic to see if he could be qualified compression hose of some type, since he is not a  surgical candidate according to the vascular surgeons.   . Atrial fibrillation ([place])    chronic persistent  . Bell's palsy   . C. difficile diarrhea   . CAD (coronary artery disease)    Old scar inferior wall myoview, 10/2009 EF 52%.  He did have previous SVG to RCA but no obstructive disease noted on his most recent cath.  SVG occluded.   . Cellulitis 04/02/2014  . Cerebral artery occlusion with cerebral infarction ([place]) 10/08/2011   Overview:  Overview:  And hx of TIA prior to CABG, all thought due to systemic emboli prior to coumadin   . Chronic diastolic heart failure ([place]) 03/01/2015   a. 12/2015: echo showing a preserved EF of 65-70%, moderate AS, and moderate TR.   Marland Kitchen Chronic LBP 10/08/2011  . CVA (cerebral vascular accident) ([place]) 10/08/2011   And hx of TIA prior to CABG, all thought due to systemic emboli prior to coumadin   . DIABETES MELLITUS, TYPE II 11/01/2009   Qualifier: Diagnosis of  By: [name] MD, [name]    . Diverticulosis   . 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 Encephalopathy acute 02/05/2015  . Gastric polyps   . GERD (gastroesophageal reflux disease)    not needing medication at thhis time- 01/22/15  . Gout   . H/O mechanical aortic valve replacement  11/01/2009   Qualifier: Diagnosis of  By: [name], [name]    . Hiatal hernia   . History of colon polyps 12/23/2011   Overview:  Overview:  Colonoscopy January 2010, 6 mm rectal tubulovillous adenoma. No high-grade dysplasia   . HTN (hypertension) 02/23/2014  . Hyperlipidemia   . Hypertension   . Hypogonadism male 04/08/2012  . Impaired glucose tolerance 10/08/2011  . Morbid obesity ([place]) 04/02/2014  . Myocardial infarction [facility]) age 89  . OSA (obstructive sleep apnea)    CPAP  . Peripheral vascular disease ([place])   . 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  4/58/0998   33 mm St Jude bileaflet mechanical valve placed via right mini thoracotomy approach  . 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 Severe mitral regurgitation 10/11/2014  . Testicular cancer [facility])    He was 58 y/o. He had surgical resection and rad tx's.   Marland Kitchen TIA (transient ischemic attack)    age 58  . Tubulovillous adenoma of colon   . Varicose veins   . 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 Venous (peripheral) insufficiency 02/03/2005    Medications:  No current facility-administered medications on file prior to encounter.    Current Outpatient Medications on File Prior to Encounter  Medication Sig Dispense Refill  . allopurinol (ZYLOPRIM) 300 MG tablet Take 300 mg by mouth daily.    Marland Kitchen aspirin EC 81 MG EC tablet Take 1 tablet (81 mg total) by mouth daily. 30 tablet 0  . bumetanide (BUMEX) 2 MG tablet Take 1 tablet (2 mg total) by mouth 2 (two) times daily. (Patient taking differently: Take 4 mg by mouth 2 (two) times daily. ) 60 tablet 0  . gabapentin (NEURONTIN) 300 MG capsule Take 300 mg by mouth 3 (three) times daily.    . magnesium oxide (MAG-OX) 400 (241.3 MG) MG tablet Take 1 tablet (400 mg total) by mouth 2 (two) times daily. (Patient taking differently: Take 400 mg by mouth 3 (three) times daily. ) 180 tablet 3  . methocarbamol (ROBAXIN) 500 MG tablet Take 1 tablet (500 mg total) by mouth 3 (three) times daily. 270 tablet 0  . oxyCODONE (OXY IR/ROXICODONE) 5 MG immediate release tablet Take 1 tablet (5 mg total) by mouth every 8 (eight) hours as needed for severe pain. (Patient taking differently: Take 5 mg by mouth 3 (three) times daily. ) 90 tablet 0  . potassium chloride SA (K-DUR) 20 MEQ tablet Take 2 tablets (40 mEq total) by mouth 3 (three) times daily. 180 tablet 0  . PRESCRIPTION MEDICATION See admin instructions. CPAP- At bedtime and  during any time of rest    . metolazone (ZAROXOLYN) 5 MG tablet Take 10 mg by mouth every morning.    . metoprolol succinate (TOPROL-XL) 25 MG 24 hr tablet Take 1.5 tablets (37.5 mg total) by mouth 2 (two) times daily. 90 tablet 0  . mupirocin ointment (BACTROBAN) 2 % Apply 1 application topically 2 (two) times daily as needed (infection prevention- both legs).     . silver sulfADIAZINE (SILVADENE) 1 % cream Apply 1 application topically daily as needed (for wound care- both legs).     . traZODone (DESYREL) 100 MG tablet Take 100 mg by mouth at bedtime.     Marland Kitchen warfarin (COUMADIN) 5 MG tablet 5 mg daily, and 2.5 mg on  Mondays (Patient taking differently: Take 5 mg by mouth. ) 135 tablet 0    Assessment: 58 y.o. male admitted with CHF exacerbation, h/o mechanical AVR and Afib, to continue warfarin.  INR slightly trend down to 3.6 this morning. Hgb low at baseline at 9.6. Given high INR goal will give low dose warfarin tonight.   Patient has been been taking 5mg  daily.   Goal of Therapy:  INR 2.5-3.5 Monitor platelets by anticoagulation protocol: Yes   Plan:  Warfarin 2.5mg  tonight Daily INR for now  Erin Hearing PharmD., BCPS Clinical Pharmacist 04/02/2019 8:52 AM

## 2019-04-02 NOTE — Progress Notes (Signed)
ANTICOAGULATION CONSULT NOTE - Initial Consult  Pharmacy Consult for Coumadin Indication: mechanical valve/Afib  Allergies  Allergen Reactions  . Penicillins Hives and Rash    Has patient had a PCN reaction causing immediate rash, facial/tongue/throat swelling, SOB or lightheadedness with hypotension: Yes Has patient had a PCN reaction causing severe rash involving mucus membranes or skin necrosis: Yes Has patient had a PCN reaction that required hospitalization unsure Has patient had a PCN reaction occurring within the last 10 years: 2010-2012 If all of the above answers are "NO", then may proceed with Cephalosporin use.  Lance Cook Other (See Comments)    CDIFF, when taking oral tablets   . Tape Other (See Comments) and Itching    Skin irritation    Patient Measurements: Height: 5' (152.4 cm) Weight: 218 lb (98.9 kg)(pt states he was weighed this morning) IBW/kg (Calculated) : 50  Vital Signs: Temp: 98.3 F (36.8 C) (07/31 2103) Temp Source: Oral (07/31 2103) BP: 99/65 (08/01 0015) Pulse Rate: 84 (08/01 0015)  Labs: Recent Labs    04/01/19 2157  HGB 9.6*  HCT 33.0*  PLT 264  LABPROT 37.4*  INR 3.9*  CREATININE 1.21    Estimated Creatinine Clearance: 66.3 mL/min (by C-G formula based on SCr of 1.21 mg/dL).   Medical History: Past Medical History:  Diagnosis Date  . Acute on chronic diastolic CHF (congestive heart failure) ([place]) 08/27/2016  . Anemia   . Anxiety   . ARDS (adult respiratory distress syndrome) ([place]) 01/27/2015  . Arthritis   . Ascending aortic dissection ([place]) 07/14/2008   Localized dissection of ascending aorta noted on CTA in 2009 and stable on CTA in 2011  . Asymptomatic chronic venous hypertension 01/15/2010   Overview:  Overview:  Qualifier: Diagnosis of  By: [name] MD, [name]   Last Assessment & Plan:  I suggested he go to a vein clinic to see if he could be qualified compression hose of some type, since he is not a  surgical candidate according to the vascular surgeons.   . Atrial fibrillation ([place])    chronic persistent  . Bell's palsy   . C. difficile diarrhea   . CAD (coronary artery disease)    Old scar inferior wall myoview, 10/2009 EF 52%.  He did have previous SVG to RCA but no obstructive disease noted on his most recent cath.  SVG occluded.   . Cellulitis 04/02/2014  . Cerebral artery occlusion with cerebral infarction ([place]) 10/08/2011   Overview:  Overview:  And hx of TIA prior to CABG, all thought due to systemic emboli prior to coumadin   . Chronic diastolic heart failure ([place]) 03/01/2015   a. 12/2015: echo showing a preserved EF of 65-70%, moderate AS, and moderate TR.   Marland Kitchen Chronic LBP 10/08/2011  . CVA (cerebral vascular accident) ([place]) 10/08/2011   And hx of TIA prior to CABG, all thought due to systemic emboli prior to coumadin   . DIABETES MELLITUS, TYPE II 11/01/2009   Qualifier: Diagnosis of  By: [name] MD, [name]    . Diverticulosis   . 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 Encephalopathy acute 02/05/2015  . Gastric polyps   . GERD (gastroesophageal reflux disease)    not needing medication at thhis time- 01/22/15  . Gout   . H/O mechanical aortic valve replacement 11/01/2009   Qualifier: Diagnosis of  By:  [place], [name]    . Hiatal hernia   . History of colon polyps 12/23/2011   Overview:  Overview:  Colonoscopy January 2010, 6 mm rectal tubulovillous adenoma. No high-grade dysplasia   . HTN (hypertension) 02/23/2014  . Hyperlipidemia   . Hypertension   . Hypogonadism male 04/08/2012  . Impaired glucose tolerance 10/08/2011  . Morbid obesity ([place]) 04/02/2014  . Myocardial infarction [facility]) age 47  . OSA (obstructive sleep apnea)    CPAP  . Peripheral vascular disease ([place])   . 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  2/95/6213   33 mm St Jude bileaflet mechanical valve placed via right mini thoracotomy approach  . 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 Severe mitral regurgitation 10/11/2014  . Testicular cancer [facility])    He was 58 y/o. He had surgical resection and rad tx's.   Marland Kitchen TIA (transient ischemic attack)    age 34  . Tubulovillous adenoma of colon   . Varicose veins   . 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 Venous (peripheral) insufficiency 02/03/2005    Medications:  No current facility-administered medications on file prior to encounter.    Current Outpatient Medications on File Prior to Encounter  Medication Sig Dispense Refill  . allopurinol (ZYLOPRIM) 300 MG tablet Take 300 mg by mouth daily.    Marland Kitchen aspirin EC 81 MG EC tablet Take 1 tablet (81 mg total) by mouth daily. 30 tablet 0  . bumetanide (BUMEX) 2 MG tablet Take 1 tablet (2 mg total) by mouth 2 (two) times daily. (Patient taking differently: Take 4 mg by mouth 2 (two) times daily. ) 60 tablet 0  . gabapentin (NEURONTIN) 300 MG capsule Take 300 mg by mouth 3 (three) times daily.    . magnesium oxide (MAG-OX) 400 (241.3 MG) MG tablet Take 1 tablet (400 mg total) by mouth 2 (two) times daily. (Patient taking differently: Take 400 mg by mouth 3 (three) times daily. ) 180 tablet 3  . methocarbamol (ROBAXIN) 500 MG tablet Take 1 tablet (500 mg total) by mouth 3 (three) times daily. 270 tablet 0  . oxyCODONE (OXY IR/ROXICODONE) 5 MG immediate release tablet Take 1 tablet (5 mg total) by mouth every 8 (eight) hours as needed for severe pain. (Patient taking differently: Take 5 mg by mouth 3 (three) times daily. ) 90 tablet 0  . potassium chloride SA (K-DUR) 20 MEQ tablet Take 2 tablets (40 mEq total) by mouth 3 (three) times daily. 180 tablet 0  . PRESCRIPTION MEDICATION See admin instructions. CPAP- At bedtime and  during any time of rest    . metolazone (ZAROXOLYN) 5 MG tablet Take 10 mg by mouth every morning.    . metoprolol succinate (TOPROL-XL) 25 MG 24 hr tablet Take 1.5 tablets (37.5 mg total) by mouth 2 (two) times daily. 90 tablet 0  . mupirocin ointment (BACTROBAN) 2 % Apply 1 application topically 2 (two) times daily as needed (infection prevention- both legs).     . silver sulfADIAZINE (SILVADENE) 1 % cream Apply 1 application topically daily as needed (for wound care- both legs).     . traZODone (DESYREL) 100 MG tablet Take 100 mg by mouth at bedtime.     Marland Kitchen warfarin (COUMADIN) 5 MG tablet 5 mg daily, and 2.5 mg on Mondays (Patient taking differently: Take 5 mg by  mouth. ) 135 tablet 0    Assessment: 58 y.o. male admitted with CHF exacerbation, h/o mechanical AVR and Afib, to continue Coumadin  Goal of Therapy:  INR 2.5-3.5 Monitor platelets by anticoagulation protocol: Yes   Plan:  No Coumadin tonight F/U daily INR  Lance Cook, Bronson Curb 04/02/2019,12:53 AM

## 2019-04-02 NOTE — Consult Note (Signed)
[place] Nurse wound consult note Patient receiving care in [facility] 2C08. Reason for Consult: BLE wounds I have reviewed the record of this patient.  There is an active order for Silvadene to BLE wounds.  I have entered a wound care order indicating nurses should follow those instructions.  Val Riles, RN, MSN, CWOCN, CNS-BC, pager [phone]

## 2019-04-02 NOTE — Consult Note (Signed)
[place] Nurse wound consult note Patient receiving care in [facility] 2C08.  I have sent a SecureChat message to Dr. Myles Gip asking for clarification for this consult as there is an order for Silvadene in the EMR.  And, for a photo of the wound for which the WOC is being consulted.  Val Riles, RN, MSN, CWOCN, CNS-BC, pager [phone]

## 2019-04-02 NOTE — Consult Note (Signed)
[place] Nurse wound consult note I see that Dr. Einar Crow ordered bilateral unna boots and that these have been placed.  I am therefore cancelling the [place] consult.  Val Riles, RN, MSN, CWOCN, CNS-BC, pager [phone]

## 2019-04-03 DIAGNOSIS — I482 Chronic atrial fibrillation, unspecified: Secondary | ICD-10-CM

## 2019-04-03 LAB — CBC WITH DIFFERENTIAL/PLATELET
Abs Immature Granulocytes: 0.09 10*3/uL — ABNORMAL HIGH (ref 0.00–0.07)
Basophils Absolute: 0 10*3/uL (ref 0.0–0.1)
Basophils Relative: 0 %
Eosinophils Absolute: 0.2 10*3/uL (ref 0.0–0.5)
Eosinophils Relative: 2 %
HCT: 31.9 % — ABNORMAL LOW (ref 39.0–52.0)
Hemoglobin: 9.2 g/dL — ABNORMAL LOW (ref 13.0–17.0)
Immature Granulocytes: 1 %
Lymphocytes Relative: 3 %
Lymphs Abs: 0.2 10*3/uL — ABNORMAL LOW (ref 0.7–4.0)
MCH: 21.1 pg — ABNORMAL LOW (ref 26.0–34.0)
MCHC: 28.8 g/dL — ABNORMAL LOW (ref 30.0–36.0)
MCV: 73 fL — ABNORMAL LOW (ref 80.0–100.0)
Monocytes Absolute: 0.6 10*3/uL (ref 0.1–1.0)
Monocytes Relative: 7 %
Neutro Abs: 8.2 10*3/uL — ABNORMAL HIGH (ref 1.7–7.7)
Neutrophils Relative %: 87 %
Platelets: 228 10*3/uL (ref 150–400)
RBC: 4.37 MIL/uL (ref 4.22–5.81)
RDW: 21.4 % — ABNORMAL HIGH (ref 11.5–15.5)
WBC: 9.4 10*3/uL (ref 4.0–10.5)
nRBC: 0 % (ref 0.0–0.2)

## 2019-04-03 LAB — BASIC METABOLIC PANEL
Anion gap: 8 (ref 5–15)
Anion gap: 9 (ref 5–15)
Anion gap: 12 (ref 5–15)
BUN: 36 mg/dL — ABNORMAL HIGH (ref 6–20)
BUN: 32 mg/dL — ABNORMAL HIGH (ref 6–20)
BUN: 32 mg/dL — ABNORMAL HIGH (ref 6–20)
CO2: 36 mmol/L — ABNORMAL HIGH (ref 22–32)
CO2: 39 mmol/L — ABNORMAL HIGH (ref 22–32)
CO2: 39 mmol/L — ABNORMAL HIGH (ref 22–32)
Calcium: 7.9 mg/dL — ABNORMAL LOW (ref 8.9–10.3)
Calcium: 8 mg/dL — ABNORMAL LOW (ref 8.9–10.3)
Calcium: 8.1 mg/dL — ABNORMAL LOW (ref 8.9–10.3)
Chloride: 89 mmol/L — ABNORMAL LOW (ref 98–111)
Chloride: 83 mmol/L — ABNORMAL LOW (ref 98–111)
Chloride: 82 mmol/L — ABNORMAL LOW (ref 98–111)
Creatinine, Ser: 0.89 mg/dL (ref 0.61–1.24)
Creatinine, Ser: 0.85 mg/dL (ref 0.61–1.24)
Creatinine, Ser: 1.1 mg/dL (ref 0.61–1.24)
GFR calc Af Amer: >60 mL/min (ref >60)
GFR calc Af Amer: >60 mL/min (ref >60)
GFR calc Af Amer: >60 mL/min (ref >60)
GFR calc non Af Amer: >60 mL/min (ref >60)
GFR calc non Af Amer: >60 mL/min (ref >60)
GFR calc non Af Amer: >60 mL/min (ref >60)
Glucose, Bld: 76 mg/dL (ref 70–99)
Glucose, Bld: 102 mg/dL — ABNORMAL HIGH (ref 70–99)
Glucose, Bld: 94 mg/dL (ref 70–99)
Potassium: 2.4 mmol/L — CL (ref 3.5–5.1)
Potassium: 2.8 mmol/L — ABNORMAL LOW (ref 3.5–5.1)
Potassium: 3 mmol/L — ABNORMAL LOW (ref 3.5–5.1)
Sodium: 133 mmol/L — ABNORMAL LOW (ref 135–145)
Sodium: 131 mmol/L — ABNORMAL LOW (ref 135–145)
Sodium: 133 mmol/L — ABNORMAL LOW (ref 135–145)

## 2019-04-03 LAB — GLUCOSE, CAPILLARY
Glucose-Capillary: 97 mg/dL (ref 70–99)
Glucose-Capillary: 98 mg/dL (ref 70–99)
Glucose-Capillary: 79 mg/dL (ref 70–99)
Glucose-Capillary: 81 mg/dL (ref 70–99)

## 2019-04-03 LAB — PROTIME-INR
INR: 2.7 — ABNORMAL HIGH (ref 0.8–1.2)
Prothrombin Time: 28.3 seconds — ABNORMAL HIGH (ref 11.4–15.2)

## 2019-04-03 LAB — MAGNESIUM: Magnesium: 1.6 mg/dL — ABNORMAL LOW (ref 1.7–2.4)

## 2019-04-03 MED ORDER — SPIRONOLACTONE 12.5 MG HALF TABLET: 12.5000 mg | ORAL_TABLET | Freq: Every day | ORAL | Status: DC

## 2019-04-03 MED ORDER — POTASSIUM CHLORIDE CRYS ER 20 MEQ PO TBCR
40.0000 meq | EXTENDED_RELEASE_TABLET | Freq: Once | ORAL | Status: AC
  Administered 2019-04-03: 40 meq via ORAL
  Filled 2019-04-03: qty 2

## 2019-04-03 MED ORDER — SPIRONOLACTONE 12.5 MG HALF TABLET
12.5000 mg | ORAL_TABLET | Freq: Every day | ORAL | Status: DC
  Administered 2019-04-03: 12.5 mg via ORAL
  Filled 2019-04-03: qty 1

## 2019-04-03 MED ORDER — WARFARIN SODIUM 5 MG PO TABS
5.0000 mg | ORAL_TABLET | Freq: Once | ORAL | Status: AC
  Administered 2019-04-03: 5 mg via ORAL
  Filled 2019-04-03: qty 1

## 2019-04-03 MED ORDER — METOLAZONE 2.5 MG PO TABS
2.5000 mg | ORAL_TABLET | Freq: Once | ORAL | Status: AC
  Administered 2019-04-03: 2.5 mg via ORAL
  Filled 2019-04-03: qty 1

## 2019-04-03 MED ORDER — POTASSIUM CHLORIDE CRYS ER 20 MEQ PO TBCR
60.0000 meq | EXTENDED_RELEASE_TABLET | Freq: Three times a day (TID) | ORAL | Status: DC
  Administered 2019-04-03 (×3): 60 meq via ORAL
  Filled 2019-04-03 (×3): qty 3

## 2019-04-03 MED ORDER — MAGNESIUM SULFATE 2 GM/50ML IV SOLN
2.0000 g | Freq: Once | INTRAVENOUS | Status: AC
  Administered 2019-04-03: 2 g via INTRAVENOUS
  Filled 2019-04-03: qty 50

## 2019-04-03 NOTE — Evaluation (Signed)
Occupational Therapy Evaluation Patient Details Name: Lance Cook MRN: [id number] DOB: 1961/03/30 Today's Date: 04/03/2019    History of Present Illness 58 yo admitted with HF exacerbation. PMHx: CHF, AVR, MVR, CAD, AFib, AA dissection, DM, HTN, PVD, testicular CA, CVA at 58yo   Clinical Impression   Pt ambulates with a RW and is assisted for LB ADL and much of housekeeping. He does most of the cooking. He lives at home with his supportive wife. Pt demonstrated ability to walk throughout unit with HR around 100 and Sp02 >95% on 2L. He remained up in chair at end of session for lunch. RN aware he wanted to sit in upright chair. Will follow acutely.    Follow Up Recommendations  No OT follow up    Equipment Recommendations  None recommended by OT    Recommendations for Other Services       Precautions / Restrictions Precautions Precautions: Fall Restrictions Weight Bearing Restrictions: No      Mobility Bed Mobility Overal bed mobility: Modified Independent             General bed mobility comments: HOB up, use of rail and momentum to come to EOB  Transfers Overall transfer level: Needs assistance Equipment used: Rolling walker (2 wheeled) Transfers: Sit to/from Stand Sit to Stand: Min assist         General transfer comment: pt pulled up on walker and asked for therapist's hand    Balance Overall balance assessment: Needs assistance   Sitting balance-Leahy Scale: Fair       Standing balance-Leahy Scale: Poor                             ADL either performed or assessed with clinical judgement   ADL Overall ADL's : Needs assistance/impaired Eating/Feeding: Independent;Sitting   Grooming: Wash/dry hands;Sitting;Set up   Upper Body Bathing: Minimal assistance   Lower Body Bathing: Maximal assistance;Sit to/from stand   Upper Body Dressing : Sitting;Minimal assistance   Lower Body Dressing: Maximal assistance;Sit to/from stand   Toilet  Transfer: Designer, television/film set Details (indicate cue type and reason): stood to urinate         Functional mobility during ADLs: Min guard;Rolling walker       Vision Baseline Vision/History: Wears glasses Wears Glasses: At all times Patient Visual Report: No change from baseline       Perception     Praxis      Pertinent Vitals/Pain Pain Assessment: No/denies pain     Hand Dominance Right   Extremity/Trunk Assessment Upper Extremity Assessment Upper Extremity Assessment: Overall WFL for tasks assessed   Lower Extremity Assessment Lower Extremity Assessment: Defer to PT evaluation   Cervical / Trunk Assessment Cervical / Trunk Assessment: Kyphotic   Communication Communication Communication: No difficulties   Cognition Arousal/Alertness: Awake/alert Behavior During Therapy: WFL for tasks assessed/performed Overall Cognitive Status: Within Functional Limits for tasks assessed                                     General Comments       Exercises     Shoulder Instructions      Home Living Family/patient expects to be discharged to:: Private residence Living Arrangements: Spouse/significant other Available Help at Discharge: Family;Available 24 hours/day Type of Home: House Home Access: Stairs to enter [employer] of Steps: 5 Entrance  Stairs-Rails: Right Home Layout: One level     Bathroom Shower/Tub: Occupational psychologist: Standard     Home Equipment: Environmental consultant - 2 wheels;Shower seat;Wheelchair - manual;Hand held shower head   Additional Comments: wife is visually impaired      Prior Functioning/Environment Level of Independence: Needs assistance  Gait / Transfers Assistance Needed: pt furniture walks or uses RW and has limited gait tolerance due to edema and back pain. Wife at times will assist pulling him up from bed or chair ADL's / Homemaking Assistance Needed: wife assists with LB bathing and  dressing, dries pt after showering and does much of the IADL            OT Problem List: Decreased activity tolerance;Impaired balance (sitting and/or standing);Cardiopulmonary status limiting activity      OT Treatment/Interventions: Self-care/ADL training;DME and/or AE instruction;Patient/family education    OT Goals(Current goals can be found in the care plan section) Acute Rehab OT Goals Patient Stated Goal: return home with my wife OT Goal Formulation: With patient Time For Goal Achievement: 04/17/19 Potential to Achieve Goals: Good ADL Goals Pt Will Perform Grooming: with supervision;standing(x 2 minutes) Pt Will Perform Upper Body Dressing: with set-up;sitting Pt Will Transfer to Toilet: with supervision;ambulating Pt Will Perform Toileting - Clothing Manipulation and hygiene: with supervision;sit to/from stand  OT Frequency: Min 2X/week   Barriers to D/C:            Co-evaluation              AM-PAC OT "6 Clicks" Daily Activity     Outcome Measure Help from another person eating meals?: None Help from another person taking care of personal grooming?: A Little Help from another person toileting, which includes using toliet, bedpan, or urinal?: A Little Help from another person bathing (including washing, rinsing, drying)?: A Lot Help from another person to put on and taking off regular upper body clothing?: A Little Help from another person to put on and taking off regular lower body clothing?: A Lot 6 Click Score: 17   End of Session Equipment Utilized During Treatment: Gait belt;Rolling walker;Oxygen(2L) Nurse Communication: Mobility status  Activity Tolerance: Patient tolerated treatment well Patient left: in chair;with call bell/phone within reach  OT Visit Diagnosis: Unsteadiness on feet (R26.81);Other abnormalities of gait and mobility (R26.89);Other (comment)(decreased activity tolerance)                Time: 2202-5427 OT Time Calculation (min): 32  min Charges:  OT General Charges $OT Visit: 1 Visit OT Evaluation $OT Eval Moderate Complexity: 1 Mod OT Treatments $Therapeutic Activity: 8-22 mins  Nestor Lewandowsky, OTR/L Acute Rehabilitation Services Pager: [phone] Office: [phone]  Malka So 04/03/2019, 12:42 PM

## 2019-04-03 NOTE — Progress Notes (Signed)
Lance Cook for Coumadin Indication: mechanical valve/Afib  Allergies  Allergen Reactions  . Penicillins Hives and Rash    Has patient had a PCN reaction causing immediate rash, facial/tongue/throat swelling, SOB or lightheadedness with hypotension: Yes Has patient had a PCN reaction causing severe rash involving mucus membranes or skin necrosis: Yes Has patient had a PCN reaction that required hospitalization unsure Has patient had a PCN reaction occurring within the last 10 years: 2010-2012 If all of the above answers are "NO", then may proceed with Cephalosporin use.  Lance Cook Other (See Comments)    CDIFF, when taking oral tablets   . Tape Other (See Comments) and Itching    Skin irritation    Patient Measurements: Height: 5' (152.4 cm) Weight: 215 lb 2.7 oz (97.6 kg) IBW/kg (Calculated) : 50  Vital Signs: Temp: 97.4 F (36.3 C) (08/02 0425) Temp Source: Axillary (08/02 0425) BP: 100/67 (08/02 0425) Pulse Rate: 102 (08/01 2320)  Labs: Recent Labs    04/01/19 2157 04/02/19 0231 04/02/19 1114 04/02/19 1507 04/03/19 0230  HGB 9.6*  --   --   --  9.2*  HCT 33.0*  --   --   --  31.9*  PLT 264  --   --   --  228  LABPROT 37.4* 35.6*  --   --  28.3*  INR 3.9* 3.6*  --   --  2.7*  CREATININE 1.21 1.12 1.15 1.06 0.89    Estimated Creatinine Clearance: 89.4 mL/min (by C-G formula based on SCr of 0.89 mg/dL).   Medical History: Past Medical History:  Diagnosis Date  . Acute on chronic diastolic CHF (congestive heart failure) ([place]) 08/27/2016  . Anemia   . Anxiety   . ARDS (adult respiratory distress syndrome) ([place]) 01/27/2015  . Arthritis   . Ascending aortic dissection ([place]) 07/14/2008   Localized dissection of ascending aorta noted on CTA in 2009 and stable on CTA in 2011  . Asymptomatic chronic venous hypertension 01/15/2010   Overview:  Overview:  Qualifier: Diagnosis of  By: [name] MD, [name]    Last Assessment & Plan:  I suggested he go to a vein clinic to see if he could be qualified compression hose of some type, since he is not a surgical candidate according to the vascular surgeons.   . Atrial fibrillation ([place])    chronic persistent  . Bell's palsy   . C. difficile diarrhea   . CAD (coronary artery disease)    Old scar inferior wall myoview, 10/2009 EF 52%.  He did have previous SVG to RCA but no obstructive disease noted on his most recent cath.  SVG occluded.   . Cellulitis 04/02/2014  . Cerebral artery occlusion with cerebral infarction ([place]) 10/08/2011   Overview:  Overview:  And hx of TIA prior to CABG, all thought due to systemic emboli prior to coumadin   . Chronic diastolic heart failure ([place]) 03/01/2015   a. 12/2015: echo showing a preserved EF of 65-70%, moderate AS, and moderate TR.   Marland Kitchen Chronic LBP 10/08/2011  . CVA (cerebral vascular accident) ([place]) 10/08/2011   And hx of TIA prior to CABG, all thought due to systemic emboli prior to coumadin   . DIABETES MELLITUS, TYPE II 11/01/2009   Qualifier: Diagnosis of  By: [name] MD, [name]    . Diverticulosis   . ED (erectile dysfunction) of organic origin 10/08/2011   Overview:  Last Assessment & Plan:  S/p unilateral orchiectomy, for testosterone  level, consider androgel pump 1.62    . Encephalopathy acute 02/05/2015  . Gastric polyps   . GERD (gastroesophageal reflux disease)    not needing medication at thhis time- 01/22/15  . Gout   . H/O mechanical aortic valve replacement 11/01/2009   Qualifier: Diagnosis of  By: [name], [name]    . Hiatal hernia   . History of colon polyps 12/23/2011   Overview:  Overview:  Colonoscopy January 2010, 6 mm rectal tubulovillous adenoma. No high-grade dysplasia   . HTN (hypertension) 02/23/2014  . Hyperlipidemia   . Hypertension   . Hypogonadism male 04/08/2012  . Impaired glucose tolerance 10/08/2011  . Morbid obesity ([place]) 04/02/2014  . Myocardial infarction [facility]) age 2  . OSA (obstructive  sleep apnea)    CPAP  . Peripheral vascular disease ([place])   . 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 3/71/6967   33 mm St Jude bileaflet mechanical valve placed via right mini thoracotomy approach  . 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 Severe mitral regurgitation 10/11/2014  . Testicular cancer [facility])    He was 58 y/o. He had surgical resection and rad tx's.   Marland Kitchen TIA (transient ischemic attack)    age 10  . Tubulovillous adenoma of colon   . Varicose veins   . 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 Venous (peripheral) insufficiency 02/03/2005    Medications:  No current facility-administered medications on file prior to encounter.    Current Outpatient Medications on File Prior to Encounter  Medication Sig Dispense Refill  . allopurinol (ZYLOPRIM) 300 MG tablet Take 300 mg by mouth daily.    Marland Kitchen aspirin EC 81 MG EC tablet Take 1 tablet (81 mg total) by mouth daily. 30 tablet 0  . bumetanide (BUMEX) 2 MG tablet Take 1 tablet (2 mg total) by mouth 2 (two) times daily. (Patient taking differently: Take 4 mg by mouth 2 (two) times daily. ) 60 tablet 0  . gabapentin (NEURONTIN) 300 MG capsule Take 300 mg by mouth 3 (three) times daily.    . magnesium oxide (MAG-OX) 400 (241.3 MG) MG tablet Take 1 tablet (400 mg total) by mouth 2 (two) times daily. (Patient taking differently: Take 400 mg by mouth 3 (three) times daily. ) 180 tablet 3  . methocarbamol (ROBAXIN) 500 MG tablet Take 1 tablet (500 mg total) by mouth 3 (three) times daily. 270 tablet 0  . metolazone (ZAROXOLYN) 5 MG tablet Take 10 mg by mouth every morning.    . metoprolol succinate (TOPROL-XL) 25 MG 24 hr tablet Take 1.5 tablets (37.5 mg total) by mouth 2 (two) times daily. 90 tablet 0  . mupirocin ointment (BACTROBAN) 2 % Apply 1 application topically 3 (three) times daily.      Marland Kitchen oxyCODONE (OXY IR/ROXICODONE) 5 MG immediate release tablet Take 1 tablet (5 mg total) by mouth every 8 (eight) hours as needed for severe pain. (Patient taking differently: Take 5 mg by mouth 3 (three) times daily. ) 90 tablet 0  . potassium chloride SA (K-DUR) 20 MEQ tablet Take 2 tablets (40 mEq total) by mouth 3 (three) times daily. 180 tablet 0  . PRESCRIPTION MEDICATION See admin instructions. CPAP- At bedtime and during any time of rest    . silver sulfADIAZINE (SILVADENE) 1 % cream Apply 1 application topically daily as needed (for  wound care- both legs).     . traZODone (DESYREL) 100 MG tablet Take 100 mg by mouth at bedtime.     Marland Kitchen warfarin (COUMADIN) 5 MG tablet 5 mg daily, and 2.5 mg on Mondays (Patient taking differently: Take 5 mg by mouth. ) 135 tablet 0    Assessment: 58 y.o. male admitted with CHF exacerbation, h/o mechanical AVR and Afib, to continue warfarin.  INR trending down to 2.7 this morning. Hgb also trending down 9.6>9.2. Back to patient's home dose tonight.   Patient has been been taking 5mg  daily.   Goal of Therapy:  INR 2.5-3.5 Monitor platelets by anticoagulation protocol: Yes   Plan:  Warfarin 5mg  tonight Daily INR for now  Erin Hearing PharmD., BCPS Clinical Pharmacist 04/03/2019 7:54 AM

## 2019-04-03 NOTE — Progress Notes (Signed)
Patient ID: Lance Cook, male   DOB: 02/04/1961, 58 y.o.   MRN: [id number]     Advanced Heart Failure Rounding Note  PCP-Cardiologist: [name], MD   Subjective:    Good diuresis yesterday on Lasix gtt.  K and Mg low this morning.  Short of breath/fatigue with exertion.    Objective:   Weight Range: 97.6 kg Body mass index is 42.02 kg/m.   Vital Signs:   Temp:  [97.3 F (36.3 C)-98.7 F (37.1 C)] 97.4 F (36.3 C) (08/02 0425) Pulse Rate:  [88-102] 102 (08/01 2320) Resp:  [17-24] 22 (08/02 0425) BP: (100-132)/(49-80) 100/67 (08/02 0425) SpO2:  [94 %-100 %] 100 % (08/02 0425) Weight:  [97.6 kg] 97.6 kg (08/02 0334) Last BM Date: 04/01/19  Weight change: Filed Weights   04/01/19 2340 04/02/19 0154 04/03/19 0334  Weight: 98.9 kg 97.2 kg 97.6 kg    Intake/Output:   Intake/Output Summary (Last 24 hours) at 04/03/2019 0735 Last data filed at 04/03/2019 0600 Gross per 24 hour  Intake 798 ml  Output 4000 ml  Net -3202 ml      Physical Exam    General:  Well appearing. No resp difficulty HEENT: Normal Neck: Supple. JVP 14+. Carotids 2+ bilat; no bruits. No lymphadenopathy or thyromegaly appreciated. Cor: PMI nondisplaced. Irregular rate & rhythm. Mechanical S1 and S2, 2/6 HSM LLSB/apex.  Lungs: Clear Abdomen: Soft, nontender, nondistended. No hepatosplenomegaly. No bruits or masses. Good bowel sounds. Extremities: No cyanosis, clubbing, rash. 1+ edema 3/4 to knees bilaterally.  Neuro: Alert & orientedx3, cranial nerves grossly intact. moves all 4 extremities w/o difficulty. Affect pleasant   Telemetry   Atrial fibrillation in 80s (personally reviewed)  Labs    CBC Recent Labs    04/01/19 2157 04/03/19 0230  WBC 11.6* 9.4  NEUTROABS 11.0* 8.2*  HGB 9.6* 9.2*  HCT 33.0* 31.9*  MCV 73.2* 73.0*  PLT 264 564   Basic Metabolic Panel Recent Labs    04/02/19 0231  04/02/19 1507 04/03/19 0230  NA 130*   < > 132* 133*  K 2.6*   < > 3.0* 2.4*  CL 86*   <  > 86* 89*  CO2 35*   < > 37* 36*  GLUCOSE 109*   < > 95 76  BUN 45*   < > 41* 36*  CREATININE 1.12   < > 1.06 0.89  CALCIUM 7.8*   < > 8.0* 7.9*  MG 1.7  --   --  1.6*   < > = values in this interval not displayed.   Liver Function Tests Recent Labs    04/01/19 2157  AST 14*  ALT 9  ALKPHOS 57  BILITOT 0.8  PROT 4.2*  ALBUMIN 1.7*   No results for input(s): LIPASE, AMYLASE in the last 72 hours. Cardiac Enzymes No results for input(s): CKTOTAL, CKMB, CKMBINDEX, TROPONINI in the last 72 hours.  BNP: BNP (last 3 results) Recent Labs    02/13/19 1504 04/01/19 2157  BNP 223.8* 302.1*    ProBNP (last 3 results) No results for input(s): PROBNP in the last 8760 hours.   D-Dimer No results for input(s): DDIMER in the last 72 hours. Hemoglobin A1C No results for input(s): HGBA1C in the last 72 hours. Fasting Lipid Panel No results for input(s): CHOL, HDL, LDLCALC, TRIG, CHOLHDL, LDLDIRECT in the last 72 hours. Thyroid Function Tests No results for input(s): TSH, T4TOTAL, T3FREE, THYROIDAB in the last 72 hours.  Invalid input(s): FREET3  Other results:  Imaging     No results found.   Medications:     Scheduled Medications: . allopurinol  300 mg Oral Daily  . aspirin EC  81 mg Oral Daily  . doxycycline  100 mg Oral Q12H  . ferrous sulfate  325 mg Oral TID WC  . gabapentin  300 mg Oral TID  . insulin aspart  0-15 Units Subcutaneous TID WC  . insulin aspart  0-5 Units Subcutaneous QHS  . magnesium oxide  400 mg Oral BID  . methocarbamol  500 mg Oral Q8H  . potassium chloride SA  60 mEq Oral TID  . sodium chloride flush  3 mL Intravenous Q12H  . spironolactone  12.5 mg Oral Daily  . traZODone  100 mg Oral QHS  . Warfarin - Pharmacist Dosing Inpatient   Does not apply q1800     Infusions: . sodium chloride    . furosemide (LASIX) infusion 12 mg/hr (04/02/19 1419)  . magnesium sulfate bolus IVPB       PRN Medications:  sodium chloride,  acetaminophen, mupirocin ointment, ondansetron (ZOFRAN) IV, oxyCODONE, silver sulfADIAZINE, sodium chloride flush  Assessment/Plan   1. Acute on chronic diastolic CHF:  Last echo in 6/20 showed preserved EF 55-60% with septal bounce from LBBB. Mean gradient was elevated across aortic valve at 30 mmHg (though not markedly elevated), and there was suggestion of mild peri-valvular regurgitation at mitral position.  He has significantly decompensated over the last several months, now with marked diuretic resistance.  His atrial fibrillation is not new.  I am worried that he may have issues with his mechanical valves (?worse obstruction across the aortic valve than the TTE revealed or worse mitral peri-valvular regurgitation than the TTE revealed).  He has a more prominent murmur than I would expect.  This may be TR but it extends to the apex. On exam, he remains volume overloaded but diuresed well yesterday with Lasix gtt and metolazone. BUN/creatinine lower today.  - Increase Lasix gtt to 15 mg/hr.  I will give metolazone 2.5 mg x 1.  Replace K and Mg aggressively again today, increasing doses. - I will arrange for TEE on Monday to more closely assess the mechanical aortic and mitral valves.  - Repeat BMET in afternoon for K.  - Will likely need RHC after diuresis.  - Unna boots.  2. Valvular heart disease: Patient has mechanical mitral and aortic valves.  As above, I am concerned that his decompensation could be due to valvular dysfunction, especially with prominent systolic murmur (may all be TR with radiation but ?significant peri-valvular MR).   - TEE on Monday.  - Continue ASA 81 and warfarin with INR goal 2.5-3.5.  3. Atrial fibrillation: Chronic.  Rate is reasonably controlled.  Long-standing atrial fibrillation with severely dilated atria, unlikely to have a successful cardioversion.  4. CAD: Last cath in 4/16 with patent native coronaries, probably occluded SVG-RCA.  No regional wall motion  abnormalities noted on last echo, doubt CAD as cause of his decompensation.  5. Venous stasis ulceration: Noted on lower legs.  - He was started on clindamycin initially for ?of co-existing cellulitis.  Given history of recurrent C difficile, I stopped this and using doxycycline for now instead.  6. Type 2 diabetes: SSI.  7. OSA: CPAP.   Length of Stay: 2  Loralie Champagne, MD  04/03/2019, 7:35 AM  Advanced Heart Failure Team Pager [phone] (M-F; 7a - 4p)  Please contact [facility] for night-coverage after hours (4p -7a )  and weekends on [email or web address]  

## 2019-04-03 NOTE — Progress Notes (Signed)
CRITICAL VALUE ALERT  Critical Value:  Potassium 2.4  Date & Time Notied:  04/03/2019 0407  Provider Notified: Fransico Him  Orders Received/Actions taken: see orders

## 2019-04-04 ENCOUNTER — Encounter (HOSPITAL_COMMUNITY)
Admission: EM | Disposition: A | Discharge status: Home Health | Payer: Self-pay | Source: Home / Self Care | Attending: Cardiology

## 2019-04-04 ENCOUNTER — Encounter (HOSPITAL_COMMUNITY): Payer: Self-pay | Admitting: Cardiology

## 2019-04-04 ENCOUNTER — Inpatient Hospital Stay (HOSPITAL_COMMUNITY): Payer: Medicare HMO | Admitting: Anesthesiology

## 2019-04-04 ENCOUNTER — Inpatient Hospital Stay (HOSPITAL_COMMUNITY): Payer: Medicare HMO

## 2019-04-04 DIAGNOSIS — I361 Nonrheumatic tricuspid (valve) insufficiency: Secondary | ICD-10-CM

## 2019-04-04 DIAGNOSIS — I509 Heart failure, unspecified: Secondary | ICD-10-CM

## 2019-04-04 HISTORY — PX: BUBBLE STUDY: SHX6837

## 2019-04-04 HISTORY — PX: TEE WITHOUT CARDIOVERSION: SHX5443

## 2019-04-04 LAB — BASIC METABOLIC PANEL
Anion gap: 10 (ref 5–15)
Anion gap: 9 (ref 5–15)
BUN: 29 mg/dL — ABNORMAL HIGH (ref 6–20)
BUN: 25 mg/dL — ABNORMAL HIGH (ref 6–20)
CO2: 40 mmol/L — ABNORMAL HIGH (ref 22–32)
CO2: 39 mmol/L — ABNORMAL HIGH (ref 22–32)
Calcium: 7.8 mg/dL — ABNORMAL LOW (ref 8.9–10.3)
Calcium: 8 mg/dL — ABNORMAL LOW (ref 8.9–10.3)
Chloride: 85 mmol/L — ABNORMAL LOW (ref 98–111)
Chloride: 86 mmol/L — ABNORMAL LOW (ref 98–111)
Creatinine, Ser: 0.97 mg/dL (ref 0.61–1.24)
Creatinine, Ser: 0.9 mg/dL (ref 0.61–1.24)
GFR calc Af Amer: >60 mL/min (ref >60)
GFR calc Af Amer: >60 mL/min (ref >60)
GFR calc non Af Amer: >60 mL/min (ref >60)
GFR calc non Af Amer: >60 mL/min (ref >60)
Glucose, Bld: 79 mg/dL (ref 70–99)
Glucose, Bld: 102 mg/dL — ABNORMAL HIGH (ref 70–99)
Potassium: 2.9 mmol/L — ABNORMAL LOW (ref 3.5–5.1)
Potassium: 3.1 mmol/L — ABNORMAL LOW (ref 3.5–5.1)
Sodium: 135 mmol/L (ref 135–145)
Sodium: 134 mmol/L — ABNORMAL LOW (ref 135–145)

## 2019-04-04 LAB — CBC WITH DIFFERENTIAL/PLATELET
Abs Immature Granulocytes: 0.07 10*3/uL (ref 0.00–0.07)
Basophils Absolute: 0.1 10*3/uL (ref 0.0–0.1)
Basophils Relative: 1 %
Eosinophils Absolute: 0.2 10*3/uL (ref 0.0–0.5)
Eosinophils Relative: 2 %
HCT: 32.3 % — ABNORMAL LOW (ref 39.0–52.0)
Hemoglobin: 9.3 g/dL — ABNORMAL LOW (ref 13.0–17.0)
Immature Granulocytes: 1 %
Lymphocytes Relative: 3 %
Lymphs Abs: 0.3 10*3/uL — ABNORMAL LOW (ref 0.7–4.0)
MCH: 20.8 pg — ABNORMAL LOW (ref 26.0–34.0)
MCHC: 28.8 g/dL — ABNORMAL LOW (ref 30.0–36.0)
MCV: 72.3 fL — ABNORMAL LOW (ref 80.0–100.0)
Monocytes Absolute: 0.7 10*3/uL (ref 0.1–1.0)
Monocytes Relative: 8 %
Neutro Abs: 7.9 10*3/uL — ABNORMAL HIGH (ref 1.7–7.7)
Neutrophils Relative %: 85 %
Platelets: 232 10*3/uL (ref 150–400)
RBC: 4.47 MIL/uL (ref 4.22–5.81)
RDW: 21.9 % — ABNORMAL HIGH (ref 11.5–15.5)
WBC: 9.3 10*3/uL (ref 4.0–10.5)
nRBC: 0 % (ref 0.0–0.2)

## 2019-04-04 LAB — GLUCOSE, CAPILLARY
Glucose-Capillary: 89 mg/dL (ref 70–99)
Glucose-Capillary: 98 mg/dL (ref 70–99)
Glucose-Capillary: 89 mg/dL (ref 70–99)
Glucose-Capillary: 82 mg/dL (ref 70–99)
Glucose-Capillary: 125 mg/dL — ABNORMAL HIGH (ref 70–99)

## 2019-04-04 LAB — PROTIME-INR
INR: 2.4 — ABNORMAL HIGH (ref 0.8–1.2)
Prothrombin Time: 26 seconds — ABNORMAL HIGH (ref 11.4–15.2)

## 2019-04-04 LAB — MAGNESIUM: Magnesium: 1.7 mg/dL (ref 1.7–2.4)

## 2019-04-04 LAB — HEPARIN LEVEL (UNFRACTIONATED): Heparin Unfractionated: <0.1 IU/mL — ABNORMAL LOW (ref 0.30–0.70)

## 2019-04-04 SURGERY — ECHOCARDIOGRAM, TRANSESOPHAGEAL
Anesthesia: Monitor Anesthesia Care

## 2019-04-04 MED ORDER — WARFARIN SODIUM 7.5 MG PO TABS
7.5000 mg | ORAL_TABLET | Freq: Once | ORAL | Status: AC
  Administered 2019-04-04: 7.5 mg via ORAL
  Filled 2019-04-04: qty 1

## 2019-04-04 MED ORDER — LIDOCAINE HCL (CARDIAC) PF 100 MG/5ML IV SOSY
PREFILLED_SYRINGE | INTRAVENOUS | Status: DC | PRN
  Administered 2019-04-04: 100 mg via INTRATRACHEAL

## 2019-04-04 MED ORDER — PROPOFOL 10 MG/ML IV BOLUS
INTRAVENOUS | Status: DC | PRN
  Administered 2019-04-04: 20 mg via INTRAVENOUS

## 2019-04-04 MED ORDER — ACETAZOLAMIDE 250 MG PO TABS
250.0000 mg | ORAL_TABLET | Freq: Two times a day (BID) | ORAL | Status: DC
  Administered 2019-04-04 – 2019-04-05 (×3): 250 mg via ORAL
  Filled 2019-04-04 (×4): qty 1

## 2019-04-04 MED ORDER — PHENYLEPHRINE HCL (PRESSORS) 10 MG/ML IV SOLN
INTRAVENOUS | Status: DC | PRN
  Administered 2019-04-04: 120 ug via INTRAVENOUS

## 2019-04-04 MED ORDER — BUTAMBEN-TETRACAINE-BENZOCAINE 2-2-14 % EX AERO
INHALATION_SPRAY | CUTANEOUS | Status: DC | PRN
  Administered 2019-04-04: 2 via TOPICAL

## 2019-04-04 MED ORDER — SPIRONOLACTONE 25 MG PO TABS
25.0000 mg | ORAL_TABLET | Freq: Every day | ORAL | Status: DC
  Administered 2019-04-04 – 2019-04-11 (×8): 25 mg via ORAL
  Filled 2019-04-04 (×8): qty 1

## 2019-04-04 MED ORDER — PROPOFOL 500 MG/50ML IV EMUL
INTRAVENOUS | Status: DC | PRN
  Administered 2019-04-04: 75 ug/kg/min via INTRAVENOUS

## 2019-04-04 MED ORDER — MAGNESIUM SULFATE 4 GM/100ML IV SOLN
4.0000 g | Freq: Once | INTRAVENOUS | Status: AC
  Administered 2019-04-04: 4 g via INTRAVENOUS
  Filled 2019-04-04: qty 100

## 2019-04-04 MED ORDER — HEPARIN (PORCINE) 25000 UT/250ML-% IV SOLN
1050.0000 [IU]/h | INTRAVENOUS | Status: DC
  Administered 2019-04-04: 850 [IU]/h via INTRAVENOUS
  Filled 2019-04-04 (×2): qty 250

## 2019-04-04 MED ORDER — POTASSIUM CHLORIDE CRYS ER 20 MEQ PO TBCR
60.0000 meq | EXTENDED_RELEASE_TABLET | ORAL | Status: AC
  Administered 2019-04-04 – 2019-04-05 (×6): 60 meq via ORAL
  Filled 2019-04-04 (×5): qty 3

## 2019-04-04 MED ORDER — SODIUM CHLORIDE 0.9 % IV SOLN
INTRAVENOUS | Status: DC
  Administered 2019-04-04: 10:00:00 via INTRAVENOUS

## 2019-04-04 NOTE — Progress Notes (Signed)
[facility] for Coumadin/Add heparin Indication: AVR/MVR/Afib  Allergies  Allergen Reactions  . Penicillins Hives and Rash    Has patient had a PCN reaction causing immediate rash, facial/tongue/throat swelling, SOB or lightheadedness with hypotension: Yes Has patient had a PCN reaction causing severe rash involving mucus membranes or skin necrosis: Yes Has patient had a PCN reaction that required hospitalization unsure Has patient had a PCN reaction occurring within the last 10 years: 2010-2012 If all of the above answers are "NO", then may proceed with Cephalosporin use.  [name] Other (See Comments)    CDIFF, when taking oral tablets   . Tape Other (See Comments) and Itching    Skin irritation    Patient Measurements: Height: 5' (152.4 cm) Weight: 215 lb 2.7 oz (97.6 kg) IBW/kg (Calculated) : 50  Vital Signs: Temp: 98.2 F (36.8 C) (08/03 1902) Temp Source: Oral (08/03 1902) BP: 102/68 (08/03 1902) Pulse Rate: 86 (08/03 1902)  Labs: Recent Labs    04/01/19 2157 04/02/19 0231  04/03/19 0230  04/03/19 1509 04/04/19 0243 04/04/19 1309 04/04/19 1905  HGB 9.6*  --   --  9.2*  --   --  9.3*  --   --   HCT 33.0*  --   --  31.9*  --   --  32.3*  --   --   PLT 264  --   --  228  --   --  232  --   --   LABPROT 37.4* 35.6*  --  28.3*  --   --  26.0*  --   --   INR 3.9* 3.6*  --  2.7*  --   --  2.4*  --   --   HEPARINUNFRC  --   --   --   --   --   --   --   --  <0.10*  CREATININE 1.21 1.12   < > 0.89   < > 1.10 0.97 0.90  --    < > = values in this interval not displayed.    Estimated Creatinine Clearance: 88.4 mL/min (by C-G formula based on SCr of 0.9 mg/dL).   Medical History: Past Medical History:  Diagnosis Date  . Acute on chronic diastolic CHF (congestive heart failure) ([place]) 08/27/2016  . Anemia   . Anxiety   . ARDS (adult respiratory distress syndrome) ([place]) 01/27/2015  . Arthritis   .  Ascending aortic dissection ([place]) 07/14/2008   Localized dissection of ascending aorta noted on CTA in 2009 and stable on CTA in 2011  . Asymptomatic chronic venous hypertension 01/15/2010   Overview:  Overview:  Qualifier: Diagnosis of  By: [name] MD, [name]   Last Assessment & Plan:  I suggested he go to a vein clinic to see if he could be qualified compression hose of some type, since he is not a surgical candidate according to the vascular surgeons.   . Atrial fibrillation ([place])    chronic persistent  . Bell's palsy   . C. difficile diarrhea   . CAD (coronary artery disease)    Old scar inferior wall myoview, 10/2009 EF 52%.  He did have previous SVG to RCA but no obstructive disease noted on his most recent cath.  SVG occluded.   . Cellulitis 04/02/2014  . Cerebral artery occlusion with cerebral infarction ([facility]) 10/08/2011   Overview:  Overview:  And hx of TIA prior to CABG, all thought due to systemic emboli prior  to coumadin   . Chronic diastolic heart failure ([place]) 03/01/2015   a. 12/2015: echo showing a preserved EF of 65-70%, moderate AS, and moderate TR.   Marland Kitchen Chronic LBP 10/08/2011  . CVA (cerebral vascular accident) ([place]) 10/08/2011   And hx of TIA prior to CABG, all thought due to systemic emboli prior to coumadin   . DIABETES MELLITUS, TYPE II 11/01/2009   Qualifier: Diagnosis of  By: [name] MD, [name]    . Diverticulosis   . 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 Encephalopathy acute 02/05/2015  . Gastric polyps   . GERD (gastroesophageal reflux disease)    not needing medication at thhis time- 01/22/15  . Gout   . H/O mechanical aortic valve replacement 11/01/2009   Qualifier: Diagnosis of  By: [name], [name]    . Hiatal hernia   . History of colon polyps 12/23/2011   Overview:  Overview:  Colonoscopy January 2010, 6 mm rectal tubulovillous  adenoma. No high-grade dysplasia   . HTN (hypertension) 02/23/2014  . Hyperlipidemia   . Hypertension   . Hypogonadism male 04/08/2012  . Impaired glucose tolerance 10/08/2011  . Morbid obesity ([place]) 04/02/2014  . Myocardial infarction [facility]) age 65  . OSA (obstructive sleep apnea)    CPAP  . Peripheral vascular disease ([place])   . 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 6/44/0347   33 mm St Jude bileaflet mechanical valve placed via right mini thoracotomy approach  . 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    1988 - Dr Blase Mess at [facility] in [place], [place]  . Severe mitral regurgitation 10/11/2014  . Testicular cancer [facility])    He was 58 y/o. He had surgical resection and rad tx's.   Marland Kitchen TIA (transient ischemic attack)    age 9  . Tubulovillous adenoma of colon   . Varicose veins   . 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 Venous (peripheral) insufficiency 02/03/2005    Medications:  No current facility-administered medications on file prior to encounter.    Current Outpatient Medications on File Prior to Encounter  Medication Sig Dispense Refill  . allopurinol (ZYLOPRIM) 300 MG tablet Take 300 mg by mouth daily.    Marland Kitchen aspirin EC 81 MG EC tablet Take 1 tablet (81 mg total) by mouth daily. 30 tablet 0  . bumetanide (BUMEX) 2 MG tablet Take 1 tablet (2 mg total) by mouth 2 (two) times daily. (Patient taking differently: Take 4 mg by mouth 2 (two) times daily. ) 60 tablet 0  . gabapentin (NEURONTIN) 300 MG capsule Take 300 mg by mouth 3 (three) times daily.    . magnesium oxide (MAG-OX) 400 (241.3 MG) MG tablet Take 1 tablet (400 mg total) by mouth 2 (two) times daily. (Patient taking differently: Take 400 mg by mouth 3 (three) times daily. ) 180 tablet 3  . methocarbamol (ROBAXIN) 500 MG tablet Take 1 tablet (500 mg total) by mouth 3 (three) times daily. 270 tablet 0  . metolazone (ZAROXOLYN) 5  MG tablet Take 10 mg by mouth every morning.    . metoprolol succinate (TOPROL-XL) 25 MG 24 hr tablet Take 1.5 tablets (37.5 mg total) by mouth 2 (two) times daily. 90 tablet 0  . mupirocin ointment (BACTROBAN) 2 % Apply 1 application topically 3 (three)  times daily.     Marland Kitchen oxyCODONE (OXY IR/ROXICODONE) 5 MG immediate release tablet Take 1 tablet (5 mg total) by mouth every 8 (eight) hours as needed for severe pain. (Patient taking differently: Take 5 mg by mouth 3 (three) times daily. ) 90 tablet 0  . potassium chloride SA (K-DUR) 20 MEQ tablet Take 2 tablets (40 mEq total) by mouth 3 (three) times daily. 180 tablet 0  . PRESCRIPTION MEDICATION See admin instructions. CPAP- At bedtime and during any time of rest    . silver sulfADIAZINE (SILVADENE) 1 % cream Apply 1 application topically daily as needed (for wound care- both legs).     . traZODone (DESYREL) 100 MG tablet Take 100 mg by mouth at bedtime.     Marland Kitchen warfarin (COUMADIN) 5 MG tablet 5 mg daily, and 2.5 mg on Mondays (Patient taking differently: Take 5 mg by mouth. ) 135 tablet 0    Assessment: 58 y.o. male admitted with CHF exacerbation, h/o mechanical AVR, MVR, Afib, to continue warfarin.  INR trending down to 2.4 this morning. Hgb also trending down 9.6>9.3. Back to patient's home dose tonight.   Patient has been been taking 5mg  daily.   Initial heparin level undetectable, will make conservative rate increase and follow up am labs. Planning to stop heparin once INR>2.5.  Goal of Therapy:  INR 2.5-3.5 Monitor platelets by anticoagulation protocol: Yes   Plan:  Increase IV heparin gtt to 1050 units/hr. Check heparin level in 6 hrs.  Erin Hearing PharmD., BCPS Clinical Pharmacist 04/04/2019 8:04 PM

## 2019-04-04 NOTE — Interval H&P Note (Signed)
History and Physical Interval Note:  04/04/2019 10:56 AM  Lance Cook  has presented today for surgery, with the diagnosis of eval mitral valve.  The various methods of treatment have been discussed with the patient and family. After consideration of risks, benefits and other options for treatment, the patient has consented to  Procedure(s): TRANSESOPHAGEAL ECHOCARDIOGRAM (TEE) (N/A) as a surgical intervention.  The patient's history has been reviewed, patient examined, no change in status, stable for surgery.  I have reviewed the patient's chart and labs.  Questions were answered to the patient's satisfaction.     Tremeka Helbling [employer]

## 2019-04-04 NOTE — Progress Notes (Addendum)
Patient ID: Lance Cook, male   DOB: 06/23/1961, 58 y.o.   MRN: [id number]     Advanced Heart Failure Rounding Note  PCP-Cardiologist: [name], MD   Subjective:    Continue to diurese with lasix drip + metolazone. Negative over 2 liters.   Feeling ok this morning.   Objective:   Weight Range: 97.6 kg Body mass index is 42.02 kg/m.   Vital Signs:   Temp:  [97.6 F (36.4 C)-98.5 F (36.9 C)] 98 F (36.7 C) (08/02 2300) Pulse Rate:  [82-100] 96 (08/03 0016) Resp:  [18-29] 20 (08/03 0016) BP: (89-107)/(61-73) 93/64 (08/02 2300) SpO2:  [89 %-98 %] 89 % (08/03 0016) Last BM Date: 04/01/19  Weight change: Filed Weights   04/01/19 2340 04/02/19 0154 04/03/19 0334  Weight: 98.9 kg 97.2 kg 97.6 kg    Intake/Output:   Intake/Output Summary (Last 24 hours) at 04/04/2019 0710 Last data filed at 04/03/2019 2154 Gross per 24 hour  Intake 1529.97 ml  Output 3875 ml  Net -2345.03 ml      Physical Exam   General:  On CPAP.  No resp difficulty HEENT: normal Neck: supple. JVP to jaw.  Carotids 2+ bilat; no bruits. No lymphadenopathy or thryomegaly appreciated. Cor: PMI nondisplaced. Irregular rate & rhythm. No rubs, gallops. Mechanical S1 and S2 2/6 HSM LLSB. Lungs: clear Abdomen: soft, nontender, nondistended. No hepatosplenomegaly. No bruits or masses. Good bowel sounds. Extremities: no cyanosis, clubbing, rash, R and LLE 1+ edema extends thighs.  Compression wraps. Neuro: alert & orientedx3, cranial nerves grossly intact. moves all 4 extremities w/o difficulty. Affect pleasant   Telemetry    A fib 80 personally reviewed.   Labs    CBC Recent Labs    04/03/19 0230 04/04/19 0243  WBC 9.4 9.3  NEUTROABS 8.2* 7.9*  HGB 9.2* 9.3*  HCT 31.9* 32.3*  MCV 73.0* 72.3*  PLT 228 119   Basic Metabolic Panel Recent Labs    04/03/19 0230  04/03/19 1509 04/04/19 0243  NA 133*   < > 133* 135  K 2.4*   < > 3.0* 2.9*  CL 89*   < > 82* 85*  CO2 36*   < > 39* 40*   GLUCOSE 76   < > 94 79  BUN 36*   < > 32* 29*  CREATININE 0.89   < > 1.10 0.97  CALCIUM 7.9*   < > 8.1* 7.8*  MG 1.6*  --   --  1.7   < > = values in this interval not displayed.   Liver Function Tests Recent Labs    04/01/19 2157  AST 14*  ALT 9  ALKPHOS 57  BILITOT 0.8  PROT 4.2*  ALBUMIN 1.7*   No results for input(s): LIPASE, AMYLASE in the last 72 hours. Cardiac Enzymes No results for input(s): CKTOTAL, CKMB, CKMBINDEX, TROPONINI in the last 72 hours.  BNP: BNP (last 3 results) Recent Labs    02/13/19 1504 04/01/19 2157  BNP 223.8* 302.1*    ProBNP (last 3 results) No results for input(s): PROBNP in the last 8760 hours.   D-Dimer No results for input(s): DDIMER in the last 72 hours. Hemoglobin A1C No results for input(s): HGBA1C in the last 72 hours. Fasting Lipid Panel No results for input(s): CHOL, HDL, LDLCALC, TRIG, CHOLHDL, LDLDIRECT in the last 72 hours. Thyroid Function Tests No results for input(s): TSH, T4TOTAL, T3FREE, THYROIDAB in the last 72 hours.  Invalid input(s): FREET3  Other results:   Imaging  No results found.   Medications:     Scheduled Medications: . allopurinol  300 mg Oral Daily  . aspirin EC  81 mg Oral Daily  . doxycycline  100 mg Oral Q12H  . ferrous sulfate  325 mg Oral TID WC  . gabapentin  300 mg Oral TID  . insulin aspart  0-15 Units Subcutaneous TID WC  . insulin aspart  0-5 Units Subcutaneous QHS  . magnesium oxide  400 mg Oral BID  . methocarbamol  500 mg Oral Q8H  . potassium chloride SA  60 mEq Oral TID  . sodium chloride flush  3 mL Intravenous Q12H  . spironolactone  12.5 mg Oral Daily  . traZODone  100 mg Oral QHS  . Warfarin - Pharmacist Dosing Inpatient   Does not apply q1800    Infusions: . sodium chloride    . sodium chloride    . furosemide (LASIX) infusion 15 mg/hr (04/04/19 0435)    PRN Medications: sodium chloride, acetaminophen, mupirocin ointment, ondansetron (ZOFRAN) IV,  oxyCODONE, silver sulfADIAZINE, sodium chloride flush  Assessment/Plan   1. Acute on chronic diastolic CHF:  Last echo in 6/20 showed preserved EF 55-60% with septal bounce from LBBB. Mean gradient was elevated across aortic valve at 30 mmHg (though not markedly elevated), and there was suggestion of mild peri-valvular regurgitation at mitral position.  He has significantly decompensated over the last several months, now with marked diuretic resistance.  His atrial fibrillation is not new.  There is concern that he may have issues with his mechanical valves (?worse obstruction across the aortic valve than the TTE revealed or worse mitral peri-valvular regurgitation than the TTE revealed).  He has a more prominent murmur than I would expect.  This may be TR but it extends to the apex. - Continue Lasix gtt to 15 mg/hr. Add 250 mg diamox twice daily. - Increase spiro to 25 mg daily.  Supp K  - TEE today to  more closely assess the mechanical aortic and mitral valves.   - Will likely need RHC after diuresis.  - Unna boots.  2. Valvular heart disease: Patient has mechanical mitral and aortic valves.  As above, I am concerned that his decompensation could be due to valvular dysfunction, especially with prominent systolic murmur (may all be TR with radiation but ?significant peri-valvular MR).   - TEE today.   - Continue ASA 81 and warfarin with INR goal 2.5-3.5. INR 2.4 today. Discussed with pharmacy. Add heparin drip.  3. Atrial fibrillation: Chronic.  Rate is reasonably controlled.  Long-standing atrial fibrillation with severely dilated atria, unlikely to have a successful cardioversion.  4. CAD: Last cath in 4/16 with patent native coronaries, probably occluded SVG-RCA.  No regional wall motion abnormalities noted on last echo, doubt CAD as cause of his decompensation.  5. Venous stasis ulceration: Noted on lower legs.  - He was started on clindamycin initially for ?of co-existing cellulitis.  Given  history of recurrent C difficile he was switched to doxycycline.   6. Type 2 diabetes: SSI.  7. OSA: CPAP.  8. Hypokalemia: K 2.9. Supp K. Increase spiro.  9. Hypomagnesia: Mag 1.7. Give 4 grams Mag now.  10. Anemia-received feraheme 02/22/2019.  Hgb 9.3. No obvious source of bleeding. Check anemia panel in the am.   Length of Stay: 3  [name], NP  04/04/2019, 7:10 AM  Advanced Heart Failure Team Pager [phone] (M-F; 7a - 4p)  Please contact [facility] for night-coverage after hours (4p -  7a ) and weekends on [email or web address]  Patient seen with NP, agree with the above note.   He diuresed well again yesterday, creatinine continues to come down but K and Mg remain low.   General: NAD Neck: JVP 14+ cm, no thyromegaly or thyroid nodule.  Lungs: Clear to auscultation bilaterally with normal respiratory effort. CV: Nondisplaced PMI.  Heart irregular S1/S2 with mech S2, no S3/S4, 2/6 HSM apex/LLSB.  1+ edema to knees.  Abdomen: Soft, nontender, no hepatosplenomegaly, no distention.  Skin: Intact without lesions or rashes.  Neurologic: Alert and oriented x 3.  Psych: Normal affect. Extremities: No clubbing or cyanosis.  HEENT: Normal.   Patient remains volume overloaded.  Will continue Lasix 15 mg/hr and instead of metolazone will give Diamox today with rising HCO3.  Increase spironolactone.  Eventual RHC.   TEE today to assess valves.   Loralie Champagne 04/04/2019 7:36 AM

## 2019-04-04 NOTE — Transfer of Care (Signed)
Immediate Anesthesia Transfer of Care Note  Patient: Lance Cook  Procedure(s) Performed: TRANSESOPHAGEAL ECHOCARDIOGRAM (TEE) (N/A ) BUBBLE STUDY  Patient Location: Endoscopy Unit  Anesthesia Type:MAC  Level of Consciousness: awake, alert , oriented and patient cooperative  Airway & Oxygen Therapy: Patient Spontanous Breathing and Patient connected to nasal cannula oxygen  Post-op Assessment: Report given to RN and Post -op Vital signs reviewed and stable  Post vital signs: Reviewed and stable  Last Vitals:  Vitals Value Taken Time  BP 99/68 04/04/19 1134  Temp 36.7 C 04/04/19 1134  Pulse 91 04/04/19 1135  Resp 20 04/04/19 1135  SpO2 100 % 04/04/19 1135  Vitals shown include unvalidated device data.  Last Pain:  Vitals:   04/04/19 1134  TempSrc: Temporal  PainSc: 0-No pain      Patients Stated Pain Goal: 2 (27/07/86 7544)  Complications: No apparent anesthesia complications

## 2019-04-04 NOTE — Plan of Care (Signed)

## 2019-04-04 NOTE — Progress Notes (Signed)
Transported to endoscopy  Dept for TEE by bed . Awake and alert.

## 2019-04-04 NOTE — H&P (View-Only) (Signed)
Patient ID: Lance Cook, male   DOB: 02-10-1961, 58 y.o.   MRN: [id number]     Advanced Heart Failure Rounding Note  PCP-Cardiologist: [name], MD   Subjective:    Continue to diurese with lasix drip + metolazone. Negative over 2 liters.   Feeling ok this morning.   Objective:   Weight Range: 97.6 kg Body mass index is 42.02 kg/m.   Vital Signs:   Temp:  [97.6 F (36.4 C)-98.5 F (36.9 C)] 98 F (36.7 C) (08/02 2300) Pulse Rate:  [82-100] 96 (08/03 0016) Resp:  [18-29] 20 (08/03 0016) BP: (89-107)/(61-73) 93/64 (08/02 2300) SpO2:  [89 %-98 %] 89 % (08/03 0016) Last BM Date: 04/01/19  Weight change: Filed Weights   04/01/19 2340 04/02/19 0154 04/03/19 0334  Weight: 98.9 kg 97.2 kg 97.6 kg    Intake/Output:   Intake/Output Summary (Last 24 hours) at 04/04/2019 0710 Last data filed at 04/03/2019 2154 Gross per 24 hour  Intake 1529.97 ml  Output 3875 ml  Net -2345.03 ml      Physical Exam   General:  On CPAP.  No resp difficulty HEENT: normal Neck: supple. JVP to jaw.  Carotids 2+ bilat; no bruits. No lymphadenopathy or thryomegaly appreciated. Cor: PMI nondisplaced. Irregular rate & rhythm. No rubs, gallops. Mechanical S1 and S2 2/6 HSM LLSB. Lungs: clear Abdomen: soft, nontender, nondistended. No hepatosplenomegaly. No bruits or masses. Good bowel sounds. Extremities: no cyanosis, clubbing, rash, R and LLE 1+ edema extends thighs.  Compression wraps. Neuro: alert & orientedx3, cranial nerves grossly intact. moves all 4 extremities w/o difficulty. Affect pleasant   Telemetry    A fib 80 personally reviewed.   Labs    CBC Recent Labs    04/03/19 0230 04/04/19 0243  WBC 9.4 9.3  NEUTROABS 8.2* 7.9*  HGB 9.2* 9.3*  HCT 31.9* 32.3*  MCV 73.0* 72.3*  PLT 228 409   Basic Metabolic Panel Recent Labs    04/03/19 0230  04/03/19 1509 04/04/19 0243  NA 133*   < > 133* 135  K 2.4*   < > 3.0* 2.9*  CL 89*   < > 82* 85*  CO2 36*   < > 39* 40*   GLUCOSE 76   < > 94 79  BUN 36*   < > 32* 29*  CREATININE 0.89   < > 1.10 0.97  CALCIUM 7.9*   < > 8.1* 7.8*  MG 1.6*  --   --  1.7   < > = values in this interval not displayed.   Liver Function Tests Recent Labs    04/01/19 2157  AST 14*  ALT 9  ALKPHOS 57  BILITOT 0.8  PROT 4.2*  ALBUMIN 1.7*   No results for input(s): LIPASE, AMYLASE in the last 72 hours. Cardiac Enzymes No results for input(s): CKTOTAL, CKMB, CKMBINDEX, TROPONINI in the last 72 hours.  BNP: BNP (last 3 results) Recent Labs    02/13/19 1504 04/01/19 2157  BNP 223.8* 302.1*    ProBNP (last 3 results) No results for input(s): PROBNP in the last 8760 hours.   D-Dimer No results for input(s): DDIMER in the last 72 hours. Hemoglobin A1C No results for input(s): HGBA1C in the last 72 hours. Fasting Lipid Panel No results for input(s): CHOL, HDL, LDLCALC, TRIG, CHOLHDL, LDLDIRECT in the last 72 hours. Thyroid Function Tests No results for input(s): TSH, T4TOTAL, T3FREE, THYROIDAB in the last 72 hours.  Invalid input(s): FREET3  Other results:   Imaging  No results found.   Medications:     Scheduled Medications: . allopurinol  300 mg Oral Daily  . aspirin EC  81 mg Oral Daily  . doxycycline  100 mg Oral Q12H  . ferrous sulfate  325 mg Oral TID WC  . gabapentin  300 mg Oral TID  . insulin aspart  0-15 Units Subcutaneous TID WC  . insulin aspart  0-5 Units Subcutaneous QHS  . magnesium oxide  400 mg Oral BID  . methocarbamol  500 mg Oral Q8H  . potassium chloride SA  60 mEq Oral TID  . sodium chloride flush  3 mL Intravenous Q12H  . spironolactone  12.5 mg Oral Daily  . traZODone  100 mg Oral QHS  . Warfarin - Pharmacist Dosing Inpatient   Does not apply q1800    Infusions: . sodium chloride    . sodium chloride    . furosemide (LASIX) infusion 15 mg/hr (04/04/19 0435)    PRN Medications: sodium chloride, acetaminophen, mupirocin ointment, ondansetron (ZOFRAN) IV,  oxyCODONE, silver sulfADIAZINE, sodium chloride flush  Assessment/Plan   1. Acute on chronic diastolic CHF:  Last echo in 6/20 showed preserved EF 55-60% with septal bounce from LBBB. Mean gradient was elevated across aortic valve at 30 mmHg (though not markedly elevated), and there was suggestion of mild peri-valvular regurgitation at mitral position.  He has significantly decompensated over the last several months, now with marked diuretic resistance.  His atrial fibrillation is not new.  There is concern that he may have issues with his mechanical valves (?worse obstruction across the aortic valve than the TTE revealed or worse mitral peri-valvular regurgitation than the TTE revealed).  He has a more prominent murmur than I would expect.  This may be TR but it extends to the apex. - Continue Lasix gtt to 15 mg/hr. Add 250 mg diamox twice daily. - Increase spiro to 25 mg daily.  Supp K  - TEE today to  more closely assess the mechanical aortic and mitral valves.   - Will likely need RHC after diuresis.  - Unna boots.  2. Valvular heart disease: Patient has mechanical mitral and aortic valves.  As above, I am concerned that his decompensation could be due to valvular dysfunction, especially with prominent systolic murmur (may all be TR with radiation but ?significant peri-valvular MR).   - TEE today.   - Continue ASA 81 and warfarin with INR goal 2.5-3.5. INR 2.4 today. Discussed with pharmacy. Add heparin drip.  3. Atrial fibrillation: Chronic.  Rate is reasonably controlled.  Long-standing atrial fibrillation with severely dilated atria, unlikely to have a successful cardioversion.  4. CAD: Last cath in 4/16 with patent native coronaries, probably occluded SVG-RCA.  No regional wall motion abnormalities noted on last echo, doubt CAD as cause of his decompensation.  5. Venous stasis ulceration: Noted on lower legs.  - He was started on clindamycin initially for ?of co-existing cellulitis.  Given  history of recurrent C difficile he was switched to doxycycline.   6. Type 2 diabetes: SSI.  7. OSA: CPAP.  8. Hypokalemia: K 2.9. Supp K. Increase spiro.  9. Hypomagnesia: Mag 1.7. Give 4 grams Mag now.  10. Anemia-received feraheme 02/22/2019.  Hgb 9.3. No obvious source of bleeding. Check anemia panel in the am.   Length of Stay: 3  [name], NP  04/04/2019, 7:10 AM  Advanced Heart Failure Team Pager [phone] (M-F; 7a - 4p)  Please contact [facility] for night-coverage after hours (4p -  7a ) and weekends on [email or web address]  Patient seen with NP, agree with the above note.   He diuresed well again yesterday, creatinine continues to come down but K and Mg remain low.   General: NAD Neck: JVP 14+ cm, no thyromegaly or thyroid nodule.  Lungs: Clear to auscultation bilaterally with normal respiratory effort. CV: Nondisplaced PMI.  Heart irregular S1/S2 with mech S2, no S3/S4, 2/6 HSM apex/LLSB.  1+ edema to knees.  Abdomen: Soft, nontender, no hepatosplenomegaly, no distention.  Skin: Intact without lesions or rashes.  Neurologic: Alert and oriented x 3.  Psych: Normal affect. Extremities: No clubbing or cyanosis.  HEENT: Normal.   Patient remains volume overloaded.  Will continue Lasix 15 mg/hr and instead of metolazone will give Diamox today with rising HCO3.  Increase spironolactone.  Eventual RHC.   TEE today to assess valves.   Loralie Champagne 04/04/2019 7:36 AM

## 2019-04-04 NOTE — CV Procedure (Addendum)
Procedure: TEE  Indication: CHF with mechanical valves, assess for valvular dysfunction.   Sedation: Per anesthesiology.  Findings: Please see echo section for full report.  Normal LV size with EF 45-50%, prominent septal-lateral dyssynchrony.  Normal RV size and systolic function.  Peak RV-RA gradient 45 mmHg.  Severe left atrial enlargement, cannot rule out very small LA appendage thrombus.  Mild right atrial enlargement.  No PFO/ASD by bubble study.  Mild TR. There was a mechanical aortic valve that appeared to open normally, mean gradient 23 mmHg (mildly increased).  No significant AI.  Mechanical mitral valve with mean gradient 5 mmHg.  There was mild peri-valvular regurgitation.  Normal caliber thoracic aorta with grade 3 plaque in the descending thoracic aorta.   Loralie Champagne 04/04/2019 11:28 AM

## 2019-04-04 NOTE — Progress Notes (Signed)
  Echocardiogram Echocardiogram Transesophageal has been performed.  Lance Cook 04/04/2019, 11:39 AM

## 2019-04-04 NOTE — TOC Initial Note (Signed)
Transition of Care [facility]) - Initial/Assessment Note    Patient Details  Name: Lance Cook MRN: [id number] Date of Birth: [birth year]  Transition of Care [facility]) CM/SW Contact:    Bartholomew Crews, RN Phone Number: [phone] 04/04/2019, 11:49 AM  Clinical Narrative:                 Unable to speak to patient at bedside d/t currently in endoscopy for TEE. Able to confirm with [place] - patient is active for [facility] RN and PT - OT had discharged him from services. CM to follow for transition of care needs.  Expected Discharge Plan: [place] Barriers to Discharge: Continued Medical Work up   Patient Goals and CMS Choice   CMS Medicare.gov Compare Post Acute Care list provided to:: Patient Choice offered to / list presented to : Patient  Expected Discharge Plan and Services Expected Discharge Plan: Lance Cook   Discharge Planning Services: CM Consult Post Acute Care Choice: Resumption of Svcs/PTA Provider                             HH Arranged: RN, PT [facility] Agency: [place] ([place]) Date HH Agency Contacted: 04/04/19 Time HH Agency Contacted: 64 Representative spoke with at [place]: [place] Arrangements/Services   Lives with:: Self, Spouse              Current home services: Home PT, Home RN    Activities of Daily Living Home Assistive Devices/Equipment: CBG Meter, CPAP, Eyeglasses, Walker (specify type) ADL Screening (condition at time of admission) Patient's cognitive ability adequate to safely complete daily activities?: Yes Is the patient deaf or have difficulty hearing?: No Does the patient have difficulty seeing, even when wearing glasses/contacts?: No Does the patient have difficulty concentrating, remembering, or making decisions?: No Patient able to express need for assistance with ADLs?: Yes Does the patient have difficulty dressing or bathing?: Yes Independently performs ADLs?:  No Communication: Independent Dressing (OT): Needs assistance Is this a change from baseline?: Pre-admission baseline Grooming: Independent Feeding: Independent Bathing: Needs assistance Is this a change from baseline?: Pre-admission baseline Toileting: Independent In/Out Bed: Needs assistance Is this a change from baseline?: Pre-admission baseline Walks in Home: Needs assistance Is this a change from baseline?: Pre-admission baseline Does the patient have difficulty walking or climbing stairs?: Yes Weakness of Legs: Both Weakness of Arms/Hands: None  Permission Sought/Granted                  Emotional Assessment              Admission diagnosis:  Hypokalemia [E87.6] Peripheral edema [R60.9] Acute on chronic congestive heart failure, unspecified heart failure type ([place]) [I50.9] Patient Active Problem List   Diagnosis Date Noted  . Acute on chronic diastolic (congestive) heart failure ([place]) 04/01/2019  . Acute on chronic congestive heart failure ([place])   . Anasarca   . Pressure injury of skin 02/14/2019  . Acute on chronic heart failure ([place]) 02/13/2019  . Benign neoplasm of ascending colon   . Benign neoplasm of rectum   . Benign neoplasm of rectosigmoid junction   . Benign neoplasm of sigmoid colon   . DM type 2 with diabetic dyslipidemia ([place]) 04/26/2018  . Lower extremity edema 03/08/2018  . Other fatigue 01/28/2018  . Chronic anticoagulation 01/05/2018  . Insomnia 11/23/2017  . Unilateral vocal cord paralysis 11/30/2016  .  Leg swelling 11/28/2016  . S/P MVR (mitral valve replacement)   . Acute on chronic diastolic CHF (congestive heart failure) ([place]) 08/27/2016  . Chronic pain syndrome 07/10/2016  . Chronic diarrhea   . Benign neoplasm of transverse colon   . Scrotal swelling 05/30/2016  . Musculoskeletal pain 01/07/2016  . Personal history of malignant neoplasm of testis 07/12/2015  . Long term current use of opiate analgesic 07/11/2015  . Long term  prescription opiate use 07/11/2015  . Opiate use (22.5 MME/day) 07/11/2015  . Opiate dependence ([place]) 07/11/2015  . Encounter for therapeutic drug level monitoring 07/11/2015  . Chronic low back pain (midline) 07/11/2015  . Lumbar facet syndrome 07/11/2015  . Chronic lower extremity pain (Left) 07/11/2015  . Adult BMI 30+ 06/19/2015  . Obesity (BMI 30-39.9) 06/19/2015  . Hypomagnesemia 02/08/2015  . Physical deconditioning 02/08/2015  . Enteritis due to Clostridium difficile 01/30/2015  . 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 15/72/6203  . Chronic diastolic congestive heart failure ([place])   . Type 2 diabetes mellitus with other specified complication ([place]) 55/97/4163  . 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 Venous insufficiency of both lower extremities 04/02/2014  . Morbid obesity ([place]) 04/02/2014  . Diabetes mellitus ([place]) 03/14/2014  . Personal history of transient ischemic attack (TIA), and cerebral infarction without residual deficits 02/23/2014  . Malignant neoplasm of testis ([place]) 02/23/2014  . H/O TIA (transient ischemic attack) and stroke 02/23/2014  . HTN (hypertension) 02/23/2014  . Lymphadenopathy 02/23/2014  . S/P AVR (aortic valve replacement) 02/23/2014  . Encounter for therapeutic drug monitoring 02/02/2014  . Long term current use of anticoagulant therapy 07/15/2012  . Hypogonadism male 04/08/2012  . 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 Testicular hypofunction 04/08/2012  . Male hypogonadism 04/08/2012  . Other testicular hypofunction 04/08/2012  . History of colonic polyps 12/23/2011  . Testicular cancer ([place])   . TIA (transient ischemic attack)   . OSA (obstructive sleep apnea)   . Lumbar spinal stenosis (Severe L4-5) 10/08/2011  . ED (erectile dysfunction) of organic origin 10/08/2011  . Lumbar canal stenosis 10/08/2011  . Low back pain 10/08/2011  . Spinal stenosis of lumbar region without  neurogenic claudication 10/08/2011  . CAD (coronary artery disease)   . Spondylosis 04/29/2010  . DYSLIPIDEMIA 01/15/2010  . Chronic venous hypertension with ulcer ([place]) 01/15/2010  . Essential hypertension 12/14/2009  . Gout 11/01/2009  . Anemia, iron deficiency 11/01/2009  . MYOCARDIAL INFARCTION, HX OF 11/01/2009  . Atrial fibrillation, chronic 11/01/2009  . PERIPHERAL EDEMA 11/01/2009  . Heart valve replaced by other means 11/01/2009  . Presence of other heart-valve replacement 11/01/2009  . Ascending aortic dissection ([place]) 07/14/2008  . 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 Venous (peripheral) insufficiency 02/03/2005   PCP:  [name], MD Pharmacy:   [facility], [place] - [address] AT [place] [address] Phone: [phone] Fax: [phone]  [facility] - [place], [place] - [address] [place] [address] Phone: [phone] Fax: [phone]  [facility] - [place], [place] [address] Phone: [phone] Fax: [phone]     Social Determinants of Health (SDOH) Interventions    Readmission Risk Interventions No flowsheet data found.

## 2019-04-04 NOTE — Anesthesia Preprocedure Evaluation (Signed)
Anesthesia Evaluation  Patient identified by MRN, date of birth, ID band Patient awake    Reviewed: Allergy & Precautions, NPO status , Patient's Chart, lab work & pertinent test results, reviewed documented beta blocker date and time   History of Anesthesia Complications Negative for: history of anesthetic complications  Airway Mallampati: II  TM Distance: >3 FB Neck ROM: Full    Dental  (+) Dental Advisory Given, Teeth Intact   Pulmonary sleep apnea and Continuous Positive Airway Pressure Ventilation , neg recent URI, former smoker,    breath sounds clear to auscultation       Cardiovascular hypertension, Pt. on medications and Pt. on home beta blockers + CAD, + Past MI, + Peripheral Vascular Disease and +CHF  + Valvular Problems/Murmurs AS and MR  Rhythm:Regular + Systolic murmurs 6283 bentall with avr, mvr   Neuro/Psych PSYCHIATRIC DISORDERS Anxiety TIA Neuromuscular disease CVA    GI/Hepatic Neg liver ROS, hiatal hernia, GERD  ,  Endo/Other  diabetes  Renal/GU negative Renal ROS     Musculoskeletal  (+) Arthritis ,   Abdominal   Peds  Hematology  (+) Blood dyscrasia, anemia ,   Anesthesia Other Findings 1. The left ventricle has normal systolic function, with an ejection fraction of 55-60%. The cavity size was normal. Left ventricular diastolic function could not be evaluated due to mitral valve replacement/repair. There is abnormal septal motion  consistent with left bundle branch block.  2. Mild hypokinesis of the left ventricular, entire septal wall.  3. The right ventricle has normal systolic function. The cavity was normal. There is no increase in right ventricular wall thickness. Right ventricular systolic pressure is severely elevated with an estimated pressure of 62.4 mmHg.  4. Left atrial size was severely dilated.  5. Right atrial size was severely dilated.  6. A 33 St. Jude mechanical valve is present  in the mitral position. Procedure Date: 01/24/2015 There is no Echo evidence of rocking of the mitral prosthesis. Echo findings are consistent with perivalvular leak the mitral prosthesis.  7. Mitral valve regurgitation mild perivalvular. Mitral valve gradient appropriate for prosthesis mitral valve stenosis.  8. The tricuspid valve is grossly normal. Tricuspid valve regurgitation is moderate.  9. A 68m St. Jude mechanical prosthesis valve is present in the aortic position. Normal aortic valve prosthesis. Echo findings are consistent with Mean gradient ~30 mmHg of the aortic prosthesis. 10. The aortic arch is normal in size and structure. 11. There is dilatation of the ascending aorta measuring 38 mm. 12. Post Bentall 1988. 13. The inferior vena cava was dilated in size with <50% respiratory variability  Reproductive/Obstetrics                             Anesthesia Physical Anesthesia Plan  ASA: III  Anesthesia Plan: MAC   Post-op Pain Management:    Induction: Intravenous  PONV Risk Score and Plan: 1 and Treatment may vary due to age or medical condition and Propofol infusion  Airway Management Planned: Nasal Cannula  Additional Equipment: None  Intra-op Plan:   Post-operative Plan:   Informed Consent: I have reviewed the patients History and Physical, chart, labs and discussed the procedure including the risks, benefits and alternatives for the proposed anesthesia with the patient or authorized representative who has indicated his/her understanding and acceptance.     Dental advisory given  Plan Discussed with: CRNA and Surgeon  Anesthesia Plan Comments:  Anesthesia Quick Evaluation  

## 2019-04-04 NOTE — Progress Notes (Signed)
[facility] [place] for Coumadin/Add heparin Indication: AVR/MVR/Afib  Allergies  Allergen Reactions  . Penicillins Hives and Rash    Has patient had a PCN reaction causing immediate rash, facial/tongue/throat swelling, SOB or lightheadedness with hypotension: Yes Has patient had a PCN reaction causing severe rash involving mucus membranes or skin necrosis: Yes Has patient had a PCN reaction that required hospitalization unsure Has patient had a PCN reaction occurring within the last 10 years: 2010-2012 If all of the above answers are "NO", then may proceed with Cephalosporin use.  [name] Other (See Comments)    CDIFF, when taking oral tablets   . Tape Other (See Comments) and Itching    Skin irritation    Patient Measurements: Height: 5' (152.4 cm) Weight: 215 lb 2.7 oz (97.6 kg) IBW/kg (Calculated) : 50  Vital Signs: Temp: 98 F (36.7 C) (08/02 2300) Temp Source: Oral (08/02 2300) BP: 93/64 (08/02 2300) Pulse Rate: 96 (08/03 0016)  Labs: Recent Labs    04/01/19 2157 04/02/19 0231  04/03/19 0230 04/03/19 1112 04/03/19 1509 04/04/19 0243  HGB 9.6*  --   --  9.2*  --   --  9.3*  HCT 33.0*  --   --  31.9*  --   --  32.3*  PLT 264  --   --  228  --   --  232  LABPROT 37.4* 35.6*  --  28.3*  --   --  26.0*  INR 3.9* 3.6*  --  2.7*  --   --  2.4*  CREATININE 1.21 1.12   < > 0.89 0.85 1.10 0.97   < > = values in this interval not displayed.    Estimated Creatinine Clearance: 82 mL/min (by C-G formula based on SCr of 0.97 mg/dL).   Medical History: Past Medical History:  Diagnosis Date  . Acute on chronic diastolic CHF (congestive heart failure) ([place]) 08/27/2016  . Anemia   . Anxiety   . ARDS (adult respiratory distress syndrome) ([place]) 01/27/2015  . Arthritis   . Ascending aortic dissection ([place]) 07/14/2008   Localized dissection of ascending aorta noted on CTA in 2009 and stable on CTA in 2011  . Asymptomatic  chronic venous hypertension 01/15/2010   Overview:  Overview:  Qualifier: Diagnosis of  By: [name] MD, [name]   Last Assessment & Plan:  I suggested he go to a vein clinic to see if he could be qualified compression hose of some type, since he is not a surgical candidate according to the vascular surgeons.   . Atrial fibrillation ([place])    chronic persistent  . Bell's palsy   . C. difficile diarrhea   . CAD (coronary artery disease)    Old scar inferior wall myoview, 10/2009 EF 52%.  He did have previous SVG to RCA but no obstructive disease noted on his most recent cath.  SVG occluded.   . Cellulitis 04/02/2014  . Cerebral artery occlusion with cerebral infarction ([place]) 10/08/2011   Overview:  Overview:  And hx of TIA prior to CABG, all thought due to systemic emboli prior to coumadin   . Chronic diastolic heart failure ([place]) 03/01/2015   a. 12/2015: echo showing a preserved EF of 65-70%, moderate AS, and moderate TR.   Marland Kitchen Chronic LBP 10/08/2011  . CVA (cerebral vascular accident) ([place]) 10/08/2011   And hx of TIA prior to CABG, all thought due to systemic emboli prior to coumadin   . DIABETES MELLITUS, TYPE II 11/01/2009  Qualifier: Diagnosis of  By: [name] MD, [name]    . Diverticulosis   . 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 Encephalopathy acute 02/05/2015  . Gastric polyps   . GERD (gastroesophageal reflux disease)    not needing medication at thhis time- 01/22/15  . Gout   . H/O mechanical aortic valve replacement 11/01/2009   Qualifier: Diagnosis of  By: [name], [name]    . Hiatal hernia   . History of colon polyps 12/23/2011   Overview:  Overview:  Colonoscopy January 2010, 6 mm rectal tubulovillous adenoma. No high-grade dysplasia   . HTN (hypertension) 02/23/2014  . Hyperlipidemia   . Hypertension   . Hypogonadism male 04/08/2012  . Impaired  glucose tolerance 10/08/2011  . Morbid obesity ([place]) 04/02/2014  . Myocardial infarction [facility]) age 69  . OSA (obstructive sleep apnea)    CPAP  . Peripheral vascular disease ([place])   . 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 0/16/0109   33 mm St Jude bileaflet mechanical valve placed via right mini thoracotomy approach  . 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 Severe mitral regurgitation 10/11/2014  . Testicular cancer [facility])    He was 58 y/o. He had surgical resection and rad tx's.   Marland Kitchen TIA (transient ischemic attack)    age 28  . Tubulovillous adenoma of colon   . Varicose veins   . 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 Venous (peripheral) insufficiency 02/03/2005    Medications:  No current facility-administered medications on file prior to encounter.    Current Outpatient Medications on File Prior to Encounter  Medication Sig Dispense Refill  . allopurinol (ZYLOPRIM) 300 MG tablet Take 300 mg by mouth daily.    Marland Kitchen aspirin EC 81 MG EC tablet Take 1 tablet (81 mg total) by mouth daily. 30 tablet 0  . bumetanide (BUMEX) 2 MG tablet Take 1 tablet (2 mg total) by mouth 2 (two) times daily. (Patient taking differently: Take 4 mg by mouth 2 (two) times daily. ) 60 tablet 0  . gabapentin (NEURONTIN) 300 MG capsule Take 300 mg by mouth 3 (three) times daily.    . magnesium oxide (MAG-OX) 400 (241.3 MG) MG tablet Take 1 tablet (400 mg total) by mouth 2 (two) times daily. (Patient taking differently: Take 400 mg by mouth 3 (three) times daily. ) 180 tablet 3  . methocarbamol (ROBAXIN) 500 MG tablet Take 1 tablet (500 mg total) by mouth 3 (three) times daily. 270 tablet 0  . metolazone (ZAROXOLYN) 5 MG tablet Take 10 mg by mouth every morning.    . metoprolol succinate (TOPROL-XL) 25 MG 24 hr tablet Take 1.5 tablets (37.5 mg total) by mouth 2  (two) times daily. 90 tablet 0  . mupirocin ointment (BACTROBAN) 2 % Apply 1 application topically 3 (three) times daily.     Marland Kitchen oxyCODONE (OXY IR/ROXICODONE) 5 MG immediate release tablet Take 1 tablet (5 mg total) by mouth every 8 (eight) hours as needed for severe pain. (Patient taking differently: Take 5 mg by mouth 3 (three) times daily. ) 90 tablet 0  . potassium chloride SA (K-DUR) 20 MEQ tablet Take 2 tablets (40 mEq total) by mouth 3 (three) times daily.  180 tablet 0  . PRESCRIPTION MEDICATION See admin instructions. CPAP- At bedtime and during any time of rest    . silver sulfADIAZINE (SILVADENE) 1 % cream Apply 1 application topically daily as needed (for wound care- both legs).     . traZODone (DESYREL) 100 MG tablet Take 100 mg by mouth at bedtime.     Marland Kitchen warfarin (COUMADIN) 5 MG tablet 5 mg daily, and 2.5 mg on Mondays (Patient taking differently: Take 5 mg by mouth. ) 135 tablet 0    Assessment: 58 y.o. male admitted with CHF exacerbation, h/o mechanical AVR, MVR, Afib, to continue warfarin.  INR trending down to 2.4 this morning. Hgb also trending down 9.6>9.3. Back to patient's home dose tonight.   Patient has been been taking 5mg  daily.   Goal of Therapy:  INR 2.5-3.5 Monitor platelets by anticoagulation protocol: Yes   Plan:  Initiate IV heparin gtt at 850 units/hr. Check heparin level in 6 hrs. Daily heparin level and CBC. Coumadin 7.5 mg x 1 tonight. Daily INR.  Marguerite Olea, [facility] Clinical Pharmacist Phone [phone]  04/04/2019 7:51 AM

## 2019-04-05 ENCOUNTER — Inpatient Hospital Stay (HOSPITAL_COMMUNITY): Payer: Medicare HMO

## 2019-04-05 ENCOUNTER — Inpatient Hospital Stay: Payer: Self-pay

## 2019-04-05 ENCOUNTER — Other Ambulatory Visit: Payer: Self-pay | Admitting: Internal Medicine

## 2019-04-05 ENCOUNTER — Ambulatory Visit (HOSPITAL_COMMUNITY): Payer: Medicare HMO | Admitting: Internal Medicine

## 2019-04-05 DIAGNOSIS — I509 Heart failure, unspecified: Secondary | ICD-10-CM

## 2019-04-05 DIAGNOSIS — R0603 Acute respiratory distress: Secondary | ICD-10-CM

## 2019-04-05 LAB — CBC WITH DIFFERENTIAL/PLATELET
Abs Immature Granulocytes: 0.09 10*3/uL — ABNORMAL HIGH (ref 0.00–0.07)
Basophils Absolute: 0.1 10*3/uL (ref 0.0–0.1)
Basophils Relative: 1 %
Eosinophils Absolute: 0.2 10*3/uL (ref 0.0–0.5)
Eosinophils Relative: 2 %
HCT: 32.7 % — ABNORMAL LOW (ref 39.0–52.0)
Hemoglobin: 9.5 g/dL — ABNORMAL LOW (ref 13.0–17.0)
Immature Granulocytes: 1 %
Lymphocytes Relative: 3 %
Lymphs Abs: 0.3 10*3/uL — ABNORMAL LOW (ref 0.7–4.0)
MCH: 21.2 pg — ABNORMAL LOW (ref 26.0–34.0)
MCHC: 29.1 g/dL — ABNORMAL LOW (ref 30.0–36.0)
MCV: 72.8 fL — ABNORMAL LOW (ref 80.0–100.0)
Monocytes Absolute: 0.8 10*3/uL (ref 0.1–1.0)
Monocytes Relative: 7 %
Neutro Abs: 9.8 10*3/uL — ABNORMAL HIGH (ref 1.7–7.7)
Neutrophils Relative %: 86 %
Platelets: 225 10*3/uL (ref 150–400)
RBC: 4.49 MIL/uL (ref 4.22–5.81)
RDW: 22.3 % — ABNORMAL HIGH (ref 11.5–15.5)
WBC: 11.3 10*3/uL — ABNORMAL HIGH (ref 4.0–10.5)
nRBC: 0 % (ref 0.0–0.2)

## 2019-04-05 LAB — FERRITIN: Ferritin: 56 ng/mL (ref 24–336)

## 2019-04-05 LAB — COMPREHENSIVE METABOLIC PANEL
ALT: 30 U/L (ref 0–44)
AST: 94 U/L — ABNORMAL HIGH (ref 15–41)
Albumin: 1.9 g/dL — ABNORMAL LOW (ref 3.5–5.0)
Alkaline Phosphatase: 87 U/L (ref 38–126)
Anion gap: 11 (ref 5–15)
BUN: 30 mg/dL — ABNORMAL HIGH (ref 6–20)
CO2: 35 mmol/L — ABNORMAL HIGH (ref 22–32)
Calcium: 8.1 mg/dL — ABNORMAL LOW (ref 8.9–10.3)
Chloride: 88 mmol/L — ABNORMAL LOW (ref 98–111)
Creatinine, Ser: 1.28 mg/dL — ABNORMAL HIGH (ref 0.61–1.24)
GFR calc Af Amer: >60 mL/min (ref >60)
GFR calc non Af Amer: >60 mL/min (ref >60)
Glucose, Bld: 82 mg/dL (ref 70–99)
Potassium: 4.2 mmol/L (ref 3.5–5.1)
Sodium: 134 mmol/L — ABNORMAL LOW (ref 135–145)
Total Bilirubin: 1.3 mg/dL — ABNORMAL HIGH (ref 0.3–1.2)
Total Protein: 4.8 g/dL — ABNORMAL LOW (ref 6.5–8.1)

## 2019-04-05 LAB — RETICULOCYTES
Immature Retic Fract: 31.2 % — ABNORMAL HIGH (ref 2.3–15.9)
RBC.: 4.49 MIL/uL (ref 4.22–5.81)
Retic Count, Absolute: 137.4 10*3/uL (ref 19.0–186.0)
Retic Ct Pct: 3.1 % (ref 0.4–3.1)

## 2019-04-05 LAB — IRON AND TIBC
Iron: 24 ug/dL — ABNORMAL LOW (ref 45–182)
Saturation Ratios: 9 % — ABNORMAL LOW (ref 17.9–39.5)
TIBC: 276 ug/dL (ref 250–450)
UIBC: 252 ug/dL

## 2019-04-05 LAB — BLOOD GAS, ARTERIAL
Acid-Base Excess: 13.2 mmol/L — ABNORMAL HIGH (ref 0.0–2.0)
Bicarbonate: 37.7 mmol/L — ABNORMAL HIGH (ref 20.0–28.0)
Delivery systems: POSITIVE
Drawn by: 448981
Expiratory PAP: 8
FIO2: 40
Inspiratory PAP: 16
O2 Saturation: 95.4 %
Patient temperature: 97.9
pCO2 arterial: 50.9 mmHg — ABNORMAL HIGH (ref 32.0–48.0)
pH, Arterial: 7.48 — ABNORMAL HIGH (ref 7.350–7.450)
pO2, Arterial: 85.9 mmHg (ref 83.0–108.0)

## 2019-04-05 LAB — PROTIME-INR
INR: 2.7 — ABNORMAL HIGH (ref 0.8–1.2)
Prothrombin Time: 28.2 seconds — ABNORMAL HIGH (ref 11.4–15.2)

## 2019-04-05 LAB — POCT I-STAT EG7
Acid-Base Excess: 12 mmol/L — ABNORMAL HIGH (ref 0.0–2.0)
Bicarbonate: 38.5 mmol/L — ABNORMAL HIGH (ref 20.0–28.0)
Calcium, Ion: 1.06 mmol/L — ABNORMAL LOW (ref 1.15–1.40)
HCT: 34 % — ABNORMAL LOW (ref 39.0–52.0)
Hemoglobin: 11.6 g/dL — ABNORMAL LOW (ref 13.0–17.0)
O2 Saturation: 75 %
Patient temperature: 99
Potassium: 4.4 mmol/L (ref 3.5–5.1)
Sodium: 126 mmol/L — ABNORMAL LOW (ref 135–145)
TCO2: 40 mmol/L — ABNORMAL HIGH (ref 22–32)
pCO2, Ven: 60.6 mmHg — ABNORMAL HIGH (ref 44.0–60.0)
pH, Ven: 7.412 (ref 7.250–7.430)
pO2, Ven: 42 mmHg (ref 32.0–45.0)

## 2019-04-05 LAB — BASIC METABOLIC PANEL
Anion gap: 10 (ref 5–15)
Anion gap: 10 (ref 5–15)
BUN: 30 mg/dL — ABNORMAL HIGH (ref 6–20)
BUN: 33 mg/dL — ABNORMAL HIGH (ref 6–20)
CO2: 37 mmol/L — ABNORMAL HIGH (ref 22–32)
CO2: 36 mmol/L — ABNORMAL HIGH (ref 22–32)
Calcium: 8 mg/dL — ABNORMAL LOW (ref 8.9–10.3)
Calcium: 7.5 mg/dL — ABNORMAL LOW (ref 8.9–10.3)
Chloride: 84 mmol/L — ABNORMAL LOW (ref 98–111)
Chloride: 82 mmol/L — ABNORMAL LOW (ref 98–111)
Creatinine, Ser: 1.02 mg/dL (ref 0.61–1.24)
Creatinine, Ser: 1.29 mg/dL — ABNORMAL HIGH (ref 0.61–1.24)
GFR calc Af Amer: >60 mL/min (ref >60)
GFR calc Af Amer: >60 mL/min (ref >60)
GFR calc non Af Amer: >60 mL/min (ref >60)
GFR calc non Af Amer: >60 mL/min (ref >60)
Glucose, Bld: 94 mg/dL (ref 70–99)
Glucose, Bld: 270 mg/dL — ABNORMAL HIGH (ref 70–99)
Potassium: 4.1 mmol/L (ref 3.5–5.1)
Potassium: 4.4 mmol/L (ref 3.5–5.1)
Sodium: 131 mmol/L — ABNORMAL LOW (ref 135–145)
Sodium: 128 mmol/L — ABNORMAL LOW (ref 135–145)

## 2019-04-05 LAB — CBC
HCT: 41.2 % (ref 39.0–52.0)
Hemoglobin: 11.6 g/dL — ABNORMAL LOW (ref 13.0–17.0)
MCH: 21 pg — ABNORMAL LOW (ref 26.0–34.0)
MCHC: 28.2 g/dL — ABNORMAL LOW (ref 30.0–36.0)
MCV: 74.5 fL — ABNORMAL LOW (ref 80.0–100.0)
Platelets: 150 10*3/uL (ref 150–400)
RBC: 5.53 MIL/uL (ref 4.22–5.81)
RDW: 22.8 % — ABNORMAL HIGH (ref 11.5–15.5)
WBC: 18.4 10*3/uL — ABNORMAL HIGH (ref 4.0–10.5)
nRBC: 0 % (ref 0.0–0.2)

## 2019-04-05 LAB — LACTIC ACID, PLASMA
Lactic Acid, Venous: 3.3 mmol/L (ref 0.5–1.9)
Lactic Acid, Venous: 1.8 mmol/L (ref 0.5–1.9)

## 2019-04-05 LAB — VITAMIN B12: Vitamin B-12: 305 pg/mL (ref 180–914)

## 2019-04-05 LAB — GLUCOSE, CAPILLARY
Glucose-Capillary: 92 mg/dL (ref 70–99)
Glucose-Capillary: 195 mg/dL — ABNORMAL HIGH (ref 70–99)
Glucose-Capillary: 261 mg/dL — ABNORMAL HIGH (ref 70–99)

## 2019-04-05 LAB — MAGNESIUM: Magnesium: 2.1 mg/dL (ref 1.7–2.4)

## 2019-04-05 LAB — FOLATE: Folate: 8.9 ng/mL (ref >5.9)

## 2019-04-05 LAB — HEPARIN LEVEL (UNFRACTIONATED): Heparin Unfractionated: <0.1 IU/mL — ABNORMAL LOW (ref 0.30–0.70)

## 2019-04-05 MED ORDER — AMIODARONE HCL IN DEXTROSE 360-4.14 MG/200ML-% IV SOLN
60.0000 mg/h | INTRAVENOUS | Status: DC
  Filled 2019-04-05: qty 200

## 2019-04-05 MED ORDER — METHYLPREDNISOLONE SODIUM SUCC 125 MG IJ SOLR
80.0000 mg | INTRAMUSCULAR | Status: AC
  Administered 2019-04-05: 80 mg via INTRAVENOUS

## 2019-04-05 MED ORDER — GABAPENTIN 100 MG PO CAPS
200.0000 mg | ORAL_CAPSULE | Freq: Three times a day (TID) | ORAL | Status: DC
  Administered 2019-04-06 – 2019-04-11 (×16): 200 mg via ORAL
  Filled 2019-04-05 (×16): qty 2

## 2019-04-05 MED ORDER — MORPHINE SULFATE (PF) 2 MG/ML IV SOLN
INTRAVENOUS | Status: AC
  Administered 2019-04-05: 1 mg via INTRAVENOUS
  Filled 2019-04-05: qty 1

## 2019-04-05 MED ORDER — POTASSIUM CHLORIDE CRYS ER 20 MEQ PO TBCR
40.0000 meq | EXTENDED_RELEASE_TABLET | Freq: Three times a day (TID) | ORAL | Status: DC
  Administered 2019-04-05 (×3): 40 meq via ORAL
  Filled 2019-04-05 (×3): qty 2

## 2019-04-05 MED ORDER — AMIODARONE HCL IN DEXTROSE 360-4.14 MG/200ML-% IV SOLN
30.0000 mg/h | INTRAVENOUS | Status: DC
  Filled 2019-04-05: qty 200

## 2019-04-05 MED ORDER — AMIODARONE HCL IN DEXTROSE 360-4.14 MG/200ML-% IV SOLN
30.0000 mg/h | INTRAVENOUS | Status: DC
  Administered 2019-04-05 – 2019-04-06 (×2): 30 mg/h via INTRAVENOUS
  Filled 2019-04-05 (×3): qty 200

## 2019-04-05 MED ORDER — METHYLPREDNISOLONE SODIUM SUCC 125 MG IJ SOLR
INTRAMUSCULAR | Status: AC
  Filled 2019-04-05: qty 2

## 2019-04-05 MED ORDER — AMIODARONE HCL IN DEXTROSE 360-4.14 MG/200ML-% IV SOLN
60.0000 mg/h | INTRAVENOUS | Status: AC
  Administered 2019-04-05 (×2): 60 mg/h via INTRAVENOUS
  Filled 2019-04-05: qty 200

## 2019-04-05 MED ORDER — PHENYLEPHRINE HCL-NACL 10-0.9 MG/250ML-% IV SOLN
0.0000 ug/min | INTRAVENOUS | Status: DC
  Administered 2019-04-05 – 2019-04-06 (×3): 20 ug/min via INTRAVENOUS
  Filled 2019-04-05 (×2): qty 250

## 2019-04-05 MED ORDER — MORPHINE SULFATE (PF) 2 MG/ML IV SOLN
1.0000 mg | Freq: Once | INTRAVENOUS | Status: AC
  Administered 2019-04-05 (×2): 1 mg via INTRAVENOUS

## 2019-04-05 MED ORDER — DIPHENHYDRAMINE HCL 50 MG/ML IJ SOLN
INTRAMUSCULAR | Status: AC
  Filled 2019-04-05: qty 1

## 2019-04-05 MED ORDER — METHOCARBAMOL 500 MG PO TABS
500.0000 mg | ORAL_TABLET | Freq: Three times a day (TID) | ORAL | Status: DC
  Administered 2019-04-06 – 2019-04-11 (×15): 500 mg via ORAL
  Filled 2019-04-05 (×15): qty 1

## 2019-04-05 MED ORDER — WARFARIN SODIUM 7.5 MG PO TABS
7.5000 mg | ORAL_TABLET | Freq: Once | ORAL | Status: AC
  Administered 2019-04-05: 7.5 mg via ORAL
  Filled 2019-04-05: qty 1

## 2019-04-05 MED ORDER — AMIODARONE LOAD VIA INFUSION
150.0000 mg | Freq: Once | INTRAVENOUS | Status: AC
  Administered 2019-04-05: 150 mg via INTRAVENOUS
  Filled 2019-04-05: qty 83.34

## 2019-04-05 MED ORDER — METHYLPREDNISOLONE SODIUM SUCC 125 MG IJ SOLR
INTRAMUSCULAR | Status: AC
  Administered 2019-04-05: 40 mg via INTRAVENOUS
  Filled 2019-04-05: qty 2

## 2019-04-05 MED ORDER — DIPHENHYDRAMINE HCL 50 MG/ML IJ SOLN
12.5000 mg | Freq: Once | INTRAMUSCULAR | Status: AC
  Administered 2019-04-05: 10:00:00 12.5 mg via INTRAVENOUS

## 2019-04-05 MED ORDER — SODIUM CHLORIDE 0.9 % IV SOLN
510.0000 mg | Freq: Once | INTRAVENOUS | Status: AC
  Administered 2019-04-05: 510 mg via INTRAVENOUS
  Filled 2019-04-05: qty 17

## 2019-04-05 MED ORDER — METHYLPREDNISOLONE SODIUM SUCC 125 MG IJ SOLR
40.0000 mg | Freq: Once | INTRAMUSCULAR | Status: AC
  Administered 2019-04-05: 10:00:00 40 mg via INTRAVENOUS

## 2019-04-05 MED ORDER — MORPHINE SULFATE (PF) 2 MG/ML IV SOLN
INTRAVENOUS | Status: AC
  Administered 2019-04-05: 10:00:00 1 mg via INTRAVENOUS
  Filled 2019-04-05: qty 1

## 2019-04-05 NOTE — Significant Event (Signed)
Rapid Response Event Note  Overview: Time Called: 1023 Arrival Time: 1026 Event Type: Hypotension, Cardiac  Initial Focused Assessment: Patient received Feraheme infusion this am.  Stopped a few minutes later because he developed increased WOB, rapid AF and chest pain.  Placed on NRB then Bipap 16/8 40% 12.5 Benadryl given IV 125 mg Solu-medrol given IV 150 mg Amiodarone bolus and gtt started 1mg  Morphine given IV x2 Upon my arrival patient on bipap, shivering but improved WOB.  BP 80/55  AF 120-140s  RR 28  O2 sat 89, poor waveform  (replaced multiple times)  Interventions: ABG done 7.48/51/86/38  O2 sat 95% Dr Haroldine Laws and Amy at bedside 65mcg/min Neosynephrine gtt started via PIV Attempted IV start, unsuccessful BP 100/84  AF 122  RR 24  O2 sat 95%  Transferred to 2H01 via bed with heart monitor and Bipap. Staff at bedside to receive patient.    Plan of Care (if not transferred):  Event Summary: Name of Physician Notified: Dr Haroldine Laws and Amy PA at      at    Outcome: Transferred (Comment)  Event End Time: [place]  Raliegh Ip

## 2019-04-05 NOTE — Progress Notes (Signed)
OT Cancellation Note  Patient Details Name: Darwin Rothlisberger MRN: [id number] DOB: 1961/03/11   Cancelled Treatment:    Reason Eval/Treat Not Completed: Medical issues which prohibited therapy, per PT change in medical status and pt transferred to ICU [facility]).  Will follow and resume OT when appropriate.   Delight Stare, OT Acute Rehabilitation Services Pager [phone] Office [place] 04/05/2019, 11:32 AM

## 2019-04-05 NOTE — Progress Notes (Addendum)
Pt becoming increasingly lethargic, unable to keep eyes open or speak more than a word at a time.  Vital signs stable, RN placed patient back on bipap  Rose, MD notified  BMET and VBG drawn   2230:  MD notified of new lab results, patient mentation continuing to worsen  2300: Rose,MD at bedside  See new orders.

## 2019-04-05 NOTE — Progress Notes (Addendum)
Pt started c/o chest pain and SOB after Feraheme started, stopped infusion, applied nonrebreather as sats dropped. Paged Phylliss Bob NP as listed as dr [name] pt. Waiting for call back

## 2019-04-05 NOTE — Plan of Care (Signed)

## 2019-04-05 NOTE — Progress Notes (Signed)
Patient ID: Lance Cook, male   DOB: 1960/12/28, 58 y.o.   MRN: [id number]     Advanced Heart Failure Rounding Note  PCP-Cardiologist: [name], MD   Subjective:    He diuresed well yesterday on Lasix gtt at 15 mg/hr + acetazolamide.  K and Mg are better-controlled this morning.   TEE showed markedly dilated LA, LV with EF 50%, septal-lateral dyssynchrony.  Normal RV systolic size/function.  Mechanical MV with mean gradient 5 and mild peri-valvular regurgitation. Mechanical AoV with mean gradient 23, appeared to open well.   Objective:   Weight Range: 92.4 kg Body mass index is 39.78 kg/m.   Vital Signs:   Temp:  [97.8 F (36.6 C)-98.7 F (37.1 C)] 97.9 F (36.6 C) (08/04 0730) Pulse Rate:  [85-113] 100 (08/04 0730) Resp:  [18-23] 21 (08/04 0730) BP: (99-115)/(57-72) 109/72 (08/04 0730) SpO2:  [91 %-100 %] 92 % (08/04 0730) Weight:  [92.4 kg] 92.4 kg (08/04 0100) Last BM Date: 04/04/19  Weight change: Filed Weights   04/02/19 0154 04/03/19 0334 04/05/19 0100  Weight: 97.2 kg 97.6 kg 92.4 kg    Intake/Output:   Intake/Output Summary (Last 24 hours) at 04/05/2019 0800 Last data filed at 04/05/2019 0600 Gross per 24 hour  Intake 1248.94 ml  Output 5450 ml  Net -4201.06 ml      Physical Exam   General:  On CPAP.  No resp difficulty HEENT: normal Neck: supple. JVP to jaw.  Carotids 2+ bilat; no bruits. No lymphadenopathy or thryomegaly appreciated. Cor: PMI nondisplaced. Irregular rate & rhythm. No rubs, gallops. Mechanical S1 and S2 2/6 HSM LLSB. Lungs: clear Abdomen: soft, nontender, nondistended. No hepatosplenomegaly. No bruits or masses. Good bowel sounds. Extremities: no cyanosis, clubbing, rash, R and LLE 1+ edema extends thighs.  Compression wraps. Neuro: alert & orientedx3, cranial nerves grossly intact. moves all 4 extremities w/o difficulty. Affect pleasant   Telemetry    A fib 80 personally reviewed.   Labs    CBC Recent Labs    04/04/19 0243  04/05/19 0425  WBC 9.3 11.3*  NEUTROABS 7.9* 9.8*  HGB 9.3* 9.5*  HCT 32.3* 32.7*  MCV 72.3* 72.8*  PLT 232 893   Basic Metabolic Panel Recent Labs    04/04/19 0243 04/04/19 1309 04/05/19 0425  NA 135 134* 131*  K 2.9* 3.1* 4.1  CL 85* 86* 84*  CO2 40* 39* 37*  GLUCOSE 79 102* 94  BUN 29* 25* 30*  CREATININE 0.97 0.90 1.02  CALCIUM 7.8* 8.0* 8.0*  MG 1.7  --  2.1   Liver Function Tests No results for input(s): AST, ALT, ALKPHOS, BILITOT, PROT, ALBUMIN in the last 72 hours. No results for input(s): LIPASE, AMYLASE in the last 72 hours. Cardiac Enzymes No results for input(s): CKTOTAL, CKMB, CKMBINDEX, TROPONINI in the last 72 hours.  BNP: BNP (last 3 results) Recent Labs    02/13/19 1504 04/01/19 2157  BNP 223.8* 302.1*    ProBNP (last 3 results) No results for input(s): PROBNP in the last 8760 hours.   D-Dimer No results for input(s): DDIMER in the last 72 hours. Hemoglobin A1C No results for input(s): HGBA1C in the last 72 hours. Fasting Lipid Panel No results for input(s): CHOL, HDL, LDLCALC, TRIG, CHOLHDL, LDLDIRECT in the last 72 hours. Thyroid Function Tests No results for input(s): TSH, T4TOTAL, T3FREE, THYROIDAB in the last 72 hours.  Invalid input(s): FREET3  Other results:   Imaging    No results found.   Medications:  Scheduled Medications: . acetaZOLAMIDE  250 mg Oral BID  . allopurinol  300 mg Oral Daily  . aspirin EC  81 mg Oral Daily  . doxycycline  100 mg Oral Q12H  . ferrous sulfate  325 mg Oral TID WC  . gabapentin  300 mg Oral TID  . insulin aspart  0-15 Units Subcutaneous TID WC  . insulin aspart  0-5 Units Subcutaneous QHS  . magnesium oxide  400 mg Oral BID  . methocarbamol  500 mg Oral Q8H  . potassium chloride  40 mEq Oral TID  . sodium chloride flush  3 mL Intravenous Q12H  . spironolactone  25 mg Oral Daily  . traZODone  100 mg Oral QHS  . Warfarin - Pharmacist Dosing Inpatient   Does not apply q1800     Infusions: . sodium chloride    . ferumoxytol    . furosemide (LASIX) infusion 15 mg/hr (04/05/19 0600)    PRN Medications: sodium chloride, acetaminophen, mupirocin ointment, ondansetron (ZOFRAN) IV, oxyCODONE, silver sulfADIAZINE, sodium chloride flush  Assessment/Plan   1. Acute on chronic diastolic CHF:  Last echo in 6/20 showed preserved EF 55-60% with septal bounce from LBBB. Mean gradient was elevated across aortic valve at 30 mmHg (though not markedly elevated), and there was suggestion of mild peri-valvular regurgitation at mitral position.  On TEE this admission, EF 50% with septal-lateral dyssynchrony, normal-appearing RV, mechanical MV with mild peri-valvular regurgitation and mean gradient 5, mechanical AoV with mean gradient 23 mmHg.  He has significantly decompensated over the last several months, now with marked diuretic resistance.  His atrial fibrillation is not new.  I do not think that dysfunction of his mechanical valves can explain his worsening CHF.  I suspect that he has developed a restrictive cardiomyopathy picture.  He diuresed well yesterday but is still markedly overloaded.  - Continue Lasix gtt to 15 mg/hr. Continue acetazolamide 250 bid.  - Continue spironolactone and K replacement.  K is normal range this morning. - RHC after diuresis, ?Thursday.   - Unna boots.  2. Valvular heart disease: Patient has mechanical mitral and aortic valves.  TEE showed mechanical MV with mean gradient 5 mmHg, mild peri-valvular regurgitation.  The aortic valve appeared to open reasonably well but mean gradient was 23 mmHg, ?component of mild patient-prosthesis mismatch.  No indication for any sort of intervention.  - Continue ASA 81 and warfarin with INR goal 2.5-3.5. INR 2.4 today. Discussed with pharmacy. Add heparin drip.  3. Atrial fibrillation: Chronic.  Rate is reasonably controlled.  Long-standing atrial fibrillation with severely dilated atria, unlikely to have a successful  cardioversion.  4. CAD: Last cath in 4/16 with patent native coronaries, probably occluded SVG-RCA.  No regional wall motion abnormalities noted on last echo, doubt CAD as cause of his decompensation.  5. Venous stasis ulceration: Noted on lower legs.  - He was started on clindamycin initially for ?of co-existing cellulitis.  Given history of recurrent C difficile he was switched to doxycycline.   6. Type 2 diabetes: SSI.  7. OSA: CPAP.  8. Hypokalemia: Resolved with current regimen.  9. Hypomagnesia: Resolved.  10. Anemia: received feraheme 02/22/2019 and again on 04/05/19.   Out of bed, ambulate.   Length of Stay: 4  Loralie Champagne, MD  04/05/2019, 8:00 AM  Advanced Heart Failure Team Pager [phone] (M-F; 7a - 4p)  Please contact [facility] for night-coverage after hours (4p -7a ) and weekends on [email or web address]

## 2019-04-05 NOTE — Progress Notes (Signed)
[facility] for Coumadin/Heparin  Indication: AVR/MVR/Afib  Allergies  Allergen Reactions  . Penicillins Hives and Rash    Has patient had a PCN reaction causing immediate rash, facial/tongue/throat swelling, SOB or lightheadedness with hypotension: Yes Has patient had a PCN reaction causing severe rash involving mucus membranes or skin necrosis: Yes Has patient had a PCN reaction that required hospitalization unsure Has patient had a PCN reaction occurring within the last 10 years: 2010-2012 If all of the above answers are "NO", then may proceed with Cephalosporin use.  Lance Cook Other (See Comments)    CDIFF, when taking oral tablets   . Tape Other (See Comments) and Itching    Skin irritation    Patient Measurements: Height: 5' (152.4 cm) Weight: 203 lb 11.3 oz (92.4 kg) IBW/kg (Calculated) : 50  Vital Signs: Temp: 98.4 F (36.9 C) (08/04 0100) Temp Source: Oral (08/04 0100) BP: 115/69 (08/04 0100) Pulse Rate: 113 (08/04 0100)  Labs: Recent Labs    04/03/19 0230  04/03/19 1509 04/04/19 0243 04/04/19 1309 04/04/19 1905 04/05/19 0425  HGB 9.2*  --   --  9.3*  --   --  9.5*  HCT 31.9*  --   --  32.3*  --   --  32.7*  PLT 228  --   --  232  --   --  225  LABPROT 28.3*  --   --  26.0*  --   --  28.2*  INR 2.7*  --   --  2.4*  --   --  2.7*  HEPARINUNFRC  --   --   --   --   --  <0.10* <0.10*  CREATININE 0.89   < > 1.10 0.97 0.90  --   --    < > = values in this interval not displayed.    Estimated Creatinine Clearance: 85.8 mL/min (by C-G formula based on SCr of 0.9 mg/dL).   Medical History: Past Medical History:  Diagnosis Date  . Acute on chronic diastolic CHF (congestive heart failure) ([place]) 08/27/2016  . Anemia   . Anxiety   . ARDS (adult respiratory distress syndrome) ([place]) 01/27/2015  . Arthritis   . Ascending aortic dissection ([place]) 07/14/2008   Localized dissection of ascending aorta noted  on CTA in 2009 and stable on CTA in 2011  . Asymptomatic chronic venous hypertension 01/15/2010   Overview:  Overview:  Qualifier: Diagnosis of  By: [name] MD, [name]   Last Assessment & Plan:  I suggested he go to a vein clinic to see if he could be qualified compression hose of some type, since he is not a surgical candidate according to the vascular surgeons.   . Atrial fibrillation ([place])    chronic persistent  . Bell's palsy   . C. difficile diarrhea   . CAD (coronary artery disease)    Old scar inferior wall myoview, 10/2009 EF 52%.  He did have previous SVG to RCA but no obstructive disease noted on his most recent cath.  SVG occluded.   . Cellulitis 04/02/2014  . Cerebral artery occlusion with cerebral infarction ([place]) 10/08/2011   Overview:  Overview:  And hx of TIA prior to CABG, all thought due to systemic emboli prior to coumadin   . Chronic diastolic heart failure ([place]) 03/01/2015   a. 12/2015: echo showing a preserved EF of 65-70%, moderate AS, and moderate TR.   Marland Kitchen Chronic LBP 10/08/2011  . CVA (cerebral vascular accident) ([place])  10/08/2011   And hx of TIA prior to CABG, all thought due to systemic emboli prior to coumadin   . DIABETES MELLITUS, TYPE II 11/01/2009   Qualifier: Diagnosis of  By: [name] MD, [name]    . Diverticulosis   . 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 Encephalopathy acute 02/05/2015  . Gastric polyps   . GERD (gastroesophageal reflux disease)    not needing medication at thhis time- 01/22/15  . Gout   . H/O mechanical aortic valve replacement 11/01/2009   Qualifier: Diagnosis of  By: [name], [name]    . Hiatal hernia   . History of colon polyps 12/23/2011   Overview:  Overview:  Colonoscopy January 2010, 6 mm rectal tubulovillous adenoma. No high-grade dysplasia   . HTN (hypertension) 02/23/2014  . Hyperlipidemia   .  Hypertension   . Hypogonadism male 04/08/2012  . Impaired glucose tolerance 10/08/2011  . Morbid obesity ([place]) 04/02/2014  . Myocardial infarction [facility]) age 35  . OSA (obstructive sleep apnea)    CPAP  . Peripheral vascular disease ([place])   . 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 12/17/4079   33 mm St Jude bileaflet mechanical valve placed via right mini thoracotomy approach  . 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 Severe mitral regurgitation 10/11/2014  . Testicular cancer [facility])    He was 58 y/o. He had surgical resection and rad tx's.   Marland Kitchen TIA (transient ischemic attack)    age 54  . Tubulovillous adenoma of colon   . Varicose veins   . 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 Venous (peripheral) insufficiency 02/03/2005    Medications:  No current facility-administered medications on file prior to encounter.    Current Outpatient Medications on File Prior to Encounter  Medication Sig Dispense Refill  . allopurinol (ZYLOPRIM) 300 MG tablet Take 300 mg by mouth daily.    Marland Kitchen aspirin EC 81 MG EC tablet Take 1 tablet (81 mg total) by mouth daily. 30 tablet 0  . bumetanide (BUMEX) 2 MG tablet Take 1 tablet (2 mg total) by mouth 2 (two) times daily. (Patient taking differently: Take 4 mg by mouth 2 (two) times daily. ) 60 tablet 0  . gabapentin (NEURONTIN) 300 MG capsule Take 300 mg by mouth 3 (three) times daily.    . magnesium oxide (MAG-OX) 400 (241.3 MG) MG tablet Take 1 tablet (400 mg total) by mouth 2 (two) times daily. (Patient taking differently: Take 400 mg by mouth 3 (three) times daily. ) 180 tablet 3  . methocarbamol (ROBAXIN) 500 MG tablet Take 1 tablet (500 mg total) by mouth 3 (three) times daily. 270 tablet 0  . metolazone (ZAROXOLYN) 5 MG tablet Take 10 mg by mouth every morning.    . metoprolol succinate (TOPROL-XL) 25 MG  24 hr tablet Take 1.5 tablets (37.5 mg total) by mouth 2 (two) times daily. 90 tablet 0  . mupirocin ointment (BACTROBAN) 2 % Apply 1 application topically 3 (three) times daily.     Marland Kitchen oxyCODONE (OXY IR/ROXICODONE) 5 MG immediate release tablet Take 1 tablet (5 mg total) by mouth every 8 (eight) hours as needed for severe pain. (Patient taking differently: Take 5 mg by mouth  3 (three) times daily. ) 90 tablet 0  . potassium chloride SA (K-DUR) 20 MEQ tablet Take 2 tablets (40 mEq total) by mouth 3 (three) times daily. 180 tablet 0  . PRESCRIPTION MEDICATION See admin instructions. CPAP- At bedtime and during any time of rest    . silver sulfADIAZINE (SILVADENE) 1 % cream Apply 1 application topically daily as needed (for wound care- both legs).     . traZODone (DESYREL) 100 MG tablet Take 100 mg by mouth at bedtime.     Marland Kitchen warfarin (COUMADIN) 5 MG tablet 5 mg daily, and 2.5 mg on Mondays (Patient taking differently: Take 5 mg by mouth. ) 135 tablet 0    Assessment: 58 y.o. male admitted with CHF exacerbation, h/o mechanical AVR, MVR, Afib, to continue warfarin.  INR trending down to 2.4 this morning. Hgb also trending down 9.6>9.3. Back to patient's home dose tonight.   Patient has been been taking 5mg  daily.   8/4 AM update: heparin was added while INR was <2.5. INR is 2.7 today.  Goal of Therapy:  Heparin level 0.3-0.7 units/mL INR 2.5-3.5 Monitor platelets by anticoagulation protocol: Yes   Plan:  DC heparin now that INR is >2.5  Narda Bonds, PharmD, BCPS Clinical Pharmacist Phone: [phone]

## 2019-04-05 NOTE — Progress Notes (Addendum)
   Called by nursing staff  Started on feraheme by staff nurse. Within a few minutes he developed increased WOB,  A-Fib RVR, and chest pain. Placed on NRB. He continued to have increased WOB. Called RT for Bipap.   Instructed to stop feraheme. EKG--> A fib RVR 160-170s.  Give 150 mg amio bolus and continue amio drip at 60 mg per hour.  Discussed with Dr [name].  No wheeze on exam.  Given 120 mg solumedrol x1.   Given a total of 2 mg morphine back pain.   Stat PCXR.  Dr [name] at the bedside.   Lance Kravitz NP-C   10:26 AM         .

## 2019-04-05 NOTE — Progress Notes (Signed)
Peripherally Inserted Central Catheter/Midline Placement  The IV Nurse has discussed with the patient and/or persons authorized to consent for the patient, the purpose of this procedure and the potential benefits and risks involved with this procedure.  The benefits include less needle sticks, lab draws from the catheter, and the patient may be discharged home with the catheter. Risks include, but not limited to, infection, bleeding, blood clot (thrombus formation), and puncture of an artery; nerve damage and irregular heartbeat and possibility to perform a PICC exchange if needed/ordered by physician.  Alternatives to this procedure were also discussed.  Bard Power PICC patient education guide, fact sheet on infection prevention and patient information card has been provided to patient /or left at bedside.    PICC/Midline Placement Documentation  PICC Triple Lumen 81/84/03 PICC Right Basilic 39 cm 0 cm (Active)  Indication for Insertion or Continuance of Line Vasoactive infusions 04/05/19 1608  Exposed Catheter (cm) 0 cm 04/05/19 1608  Site Assessment Clean;Dry;Intact 04/05/19 1608  Lumen #1 Status Flushed;Saline locked;Blood return noted 04/05/19 1608  Lumen #2 Status Flushed;Saline locked;Blood return noted 04/05/19 1608  Lumen #3 Status Flushed;Saline locked;Blood return noted 04/05/19 1608  Dressing Type Transparent 04/05/19 1608  Dressing Status Clean;Dry;Intact;Antimicrobial disc in place 04/05/19 1608  Dressing Change Due 04/12/19 04/05/19 1608       Gordan Payment 04/05/2019, 4:08 PM

## 2019-04-05 NOTE — Progress Notes (Signed)
Lance Cook returned call, will call amy clegg np as more aware of her care.

## 2019-04-05 NOTE — Progress Notes (Signed)
  Patient developed acute reaction during Feraheme infusion with severe back, respiratory distress and new AF with RVR.  Seen emergently by Darrick Grinder, NP and Dr. [name].   Rapid response also called. I was asked to assume care  On my arrival patient on Bipap satting in high 67s. Awake but lethargic after receiving morphine and benadryl. Back pain improved. Mildly tachypneic. Tachy with rates 120s with mechanical s2. + edema with UNNA boots.   AF rate down 150s-> 120s after amio started.    SBP 70-78s. I started Neosynephrine for BP support.   CXR with mildly increased edema and he was continued on lasix gtt.   ABG 7.48/51/86/95% on Bipap.   Patient transferred to ICU for further monitoring but was clearly improving.   Will continue to monitor throughout the day.   CCT 45 minutes.   Glori Bickers, MD  11:58 AM

## 2019-04-05 NOTE — Progress Notes (Signed)
Physical Therapy Treatment Patient Details Name: Lance Cook MRN: [id number] DOB: 1961-03-05 Today's Date: 04/05/2019    History of Present Illness 58 yo admitted with HF exacerbation. PMHx: CHF, AVR, MVR, CAD, AFib, AA dissection, DM, HTN, PVD, testicular CA, CVA at 58yo    PT Comments    Pt supine on arrival with nursing present and very willing to walk this date. With RW able to walk lap on unit with one standing rest. HR 120-135 with gait tachy with SpO2 93-95% on RA with cues for breathing technique. Pt able to sit in chair to perform HEP prior to return to bed per pt request. Pt requests to have unna boots removed as they are irritating his skin and encouraged discussion with MD. Pt moving well and encouraged to continue to get up to toilet with nursing assist and be OOB. Will continue to follow.     Follow Up Recommendations  Home health PT     Equipment Recommendations  None recommended by PT    Recommendations for Other Services       Precautions / Restrictions Precautions Precautions: Fall Restrictions Weight Bearing Restrictions: No    Mobility  Bed Mobility Overal bed mobility: Needs Assistance Bed Mobility: Supine to Sit;Sit to Supine     Supine to sit: Min assist Sit to supine: Min assist   General bed mobility comments: min assist HHA to elevate trunk from surface, assist to elevate legs onto bed with return to bed  Transfers Overall transfer level: Needs assistance   Transfers: Sit to/from Stand Sit to Stand: Min assist         General transfer comment: pt pulled up on walker and asked for therapist's hand to stand from bed without significant assist, stood from chair without assist  Ambulation/Gait Ambulation/Gait assistance: Min guard Gait Distance (Feet): 150 Feet Assistive device: Rolling walker (2 wheeled) Gait Pattern/deviations: Step-through pattern;Decreased stride length;Trunk flexed   Gait velocity interpretation: >2.62 ft/sec,  indicative of community ambulatory General Gait Details: pt propping on RW with forearms halfway through gait due to need to void, cues for posture and proximity with slow steady gait with RW use. HR 130 with gait with SpO2 93-95% on RA   Stairs             Wheelchair Mobility    Modified Rankin (Stroke Patients Only)       Balance Overall balance assessment: Needs assistance   Sitting balance-Leahy Scale: Good       Standing balance-Leahy Scale: Poor Standing balance comment: bil UE support for gait                            Cognition Arousal/Alertness: Awake/alert Behavior During Therapy: WFL for tasks assessed/performed Overall Cognitive Status: Within Functional Limits for tasks assessed                                        Exercises General Exercises - Lower Extremity Long Arc Quad: AROM;15 reps;Both;Seated Hip Flexion/Marching: AROM;Both;Seated;10 reps    General Comments        Pertinent Vitals/Pain Pain Location: legs and back Pain Descriptors / Indicators: Aching Pain Intervention(s): Limited activity within patient's tolerance;Repositioned    Home Living                      Prior Function  PT Goals (current goals can now be found in the care plan section) Progress towards PT goals: Progressing toward goals    Frequency    Min 3X/week      PT Plan Current plan remains appropriate    Co-evaluation              AM-PAC PT "6 Clicks" Mobility   Outcome Measure  Help needed turning from your back to your side while in a flat bed without using bedrails?: A Little Help needed moving from lying on your back to sitting on the side of a flat bed without using bedrails?: A Little Help needed moving to and from a bed to a chair (including a wheelchair)?: A Little Help needed standing up from a chair using your arms (e.g., wheelchair or bedside chair)?: A Little Help needed to walk in  hospital room?: A Little Help needed climbing 3-5 steps with a railing? : A Lot 6 Click Score: 17    End of Session   Activity Tolerance: Patient tolerated treatment well Patient left: in bed;with call bell/phone within reach Nurse Communication: Mobility status PT Visit Diagnosis: Other abnormalities of gait and mobility (R26.89);Muscle weakness (generalized) (M62.81)     Time: 5027-7412 PT Time Calculation (min) (ACUTE ONLY): 27 min  Charges:  $Gait Training: 8-22 mins $Therapeutic Exercise: 8-22 mins                     Emmi Wertheim Pam Drown, PT Acute Rehabilitation Services Pager: [phone] Office: [place] 04/05/2019, 1:00 PM

## 2019-04-05 NOTE — Progress Notes (Signed)
Lance Cook for Coumadin Indication: AVR/MVR/Afib  Allergies  Allergen Reactions  . Penicillins Hives and Rash    Has patient had a PCN reaction causing immediate rash, facial/tongue/throat swelling, SOB or lightheadedness with hypotension: Yes Has patient had a PCN reaction causing severe rash involving mucus membranes or skin necrosis: Yes Has patient had a PCN reaction that required hospitalization unsure Has patient had a PCN reaction occurring within the last 10 years: 2010-2012 If all of the above answers are "NO", then may proceed with Cephalosporin use.  Lance Cook Other (See Comments)    CDIFF, when taking oral tablets   . Tape Other (See Comments) and Itching    Skin irritation    Patient Measurements: Height: 5' (152.4 cm) Weight: 203 lb 11.3 oz (92.4 kg) IBW/kg (Calculated) : 50  Vital Signs: Temp: 97.9 F (36.6 C) (08/04 0730) Temp Source: Oral (08/04 0730) BP: 109/72 (08/04 0730) Pulse Rate: 100 (08/04 0730)  Labs: Recent Labs    04/03/19 0230  04/04/19 0243 04/04/19 1309 04/04/19 1905 04/05/19 0425  HGB 9.2*  --  9.3*  --   --  9.5*  HCT 31.9*  --  32.3*  --   --  32.7*  PLT 228  --  232  --   --  225  LABPROT 28.3*  --  26.0*  --   --  28.2*  INR 2.7*  --  2.4*  --   --  2.7*  HEPARINUNFRC  --   --   --   --  <0.10* <0.10*  CREATININE 0.89   < > 0.97 0.90  --  1.02   < > = values in this interval not displayed.    Estimated Creatinine Clearance: 75.7 mL/min (by C-G formula based on SCr of 1.02 mg/dL).   Medical History: Past Medical History:  Diagnosis Date  . Acute on chronic diastolic CHF (congestive heart failure) ([place]) 08/27/2016  . Anemia   . Anxiety   . ARDS (adult respiratory distress syndrome) ([place]) 01/27/2015  . Arthritis   . Ascending aortic dissection ([place]) 07/14/2008   Localized dissection of ascending aorta noted on CTA in 2009 and stable on CTA in 2011  . Asymptomatic  chronic venous hypertension 01/15/2010   Overview:  Overview:  Qualifier: Diagnosis of  By: [name] MD, [name]   Last Assessment & Plan:  I suggested he go to a vein clinic to see if he could be qualified compression hose of some type, since he is not a surgical candidate according to the vascular surgeons.   . Atrial fibrillation ([place])    chronic persistent  . Bell's palsy   . C. difficile diarrhea   . CAD (coronary artery disease)    Old scar inferior wall myoview, 10/2009 EF 52%.  He did have previous SVG to RCA but no obstructive disease noted on his most recent cath.  SVG occluded.   . Cellulitis 04/02/2014  . Cerebral artery occlusion with cerebral infarction ([place]) 10/08/2011   Overview:  Overview:  And hx of TIA prior to CABG, all thought due to systemic emboli prior to coumadin   . Chronic diastolic heart failure ([place]) 03/01/2015   a. 12/2015: echo showing a preserved EF of 65-70%, moderate AS, and moderate TR.   Marland Kitchen Chronic LBP 10/08/2011  . CVA (cerebral vascular accident) ([place]) 10/08/2011   And hx of TIA prior to CABG, all thought due to systemic emboli prior to coumadin   . DIABETES MELLITUS,  TYPE II 11/01/2009   Qualifier: Diagnosis of  By: [name] MD, [name]    . Diverticulosis   . 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 Encephalopathy acute 02/05/2015  . Gastric polyps   . GERD (gastroesophageal reflux disease)    not needing medication at thhis time- 01/22/15  . Gout   . H/O mechanical aortic valve replacement 11/01/2009   Qualifier: Diagnosis of  By: [name], [name]    . Hiatal hernia   . History of colon polyps 12/23/2011   Overview:  Overview:  Colonoscopy January 2010, 6 mm rectal tubulovillous adenoma. No high-grade dysplasia   . HTN (hypertension) 02/23/2014  . Hyperlipidemia   . Hypertension   . Hypogonadism male 04/08/2012  . Impaired  glucose tolerance 10/08/2011  . Morbid obesity ([place]) 04/02/2014  . Myocardial infarction [facility]) age 40  . OSA (obstructive sleep apnea)    CPAP  . Peripheral vascular disease ([place])   . 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 7/82/9562   33 mm St Jude bileaflet mechanical valve placed via right mini thoracotomy approach  . 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    1988 - Dr Blase Mess at [facility] in [place], [place]  . Severe mitral regurgitation 10/11/2014  . Testicular cancer [facility])    He was 58 y/o. He had surgical resection and rad tx's.   Marland Kitchen TIA (transient ischemic attack)    age 31  . Tubulovillous adenoma of colon   . Varicose veins   . 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 Venous (peripheral) insufficiency 02/03/2005    Medications:  No current facility-administered medications on file prior to encounter.    Current Outpatient Medications on File Prior to Encounter  Medication Sig Dispense Refill  . allopurinol (ZYLOPRIM) 300 MG tablet Take 300 mg by mouth daily.    Marland Kitchen aspirin EC 81 MG EC tablet Take 1 tablet (81 mg total) by mouth daily. 30 tablet 0  . bumetanide (BUMEX) 2 MG tablet Take 1 tablet (2 mg total) by mouth 2 (two) times daily. (Patient taking differently: Take 4 mg by mouth 2 (two) times daily. ) 60 tablet 0  . gabapentin (NEURONTIN) 300 MG capsule Take 300 mg by mouth 3 (three) times daily.    . magnesium oxide (MAG-OX) 400 (241.3 MG) MG tablet Take 1 tablet (400 mg total) by mouth 2 (two) times daily. (Patient taking differently: Take 400 mg by mouth 3 (three) times daily. ) 180 tablet 3  . methocarbamol (ROBAXIN) 500 MG tablet Take 1 tablet (500 mg total) by mouth 3 (three) times daily. 270 tablet 0  . metolazone (ZAROXOLYN) 5 MG tablet Take 10 mg by mouth every morning.    . metoprolol succinate (TOPROL-XL) 25 MG 24 hr tablet Take 1.5 tablets (37.5 mg total) by mouth 2  (two) times daily. 90 tablet 0  . mupirocin ointment (BACTROBAN) 2 % Apply 1 application topically 3 (three) times daily.     Marland Kitchen oxyCODONE (OXY IR/ROXICODONE) 5 MG immediate release tablet Take 1 tablet (5 mg total) by mouth every 8 (eight) hours as needed for severe pain. (Patient taking differently: Take 5 mg by mouth 3 (three) times daily. ) 90 tablet 0  . potassium chloride SA (K-DUR) 20 MEQ tablet Take 2 tablets (40 mEq total) by  mouth 3 (three) times daily. 180 tablet 0  . PRESCRIPTION MEDICATION See admin instructions. CPAP- At bedtime and during any time of rest    . silver sulfADIAZINE (SILVADENE) 1 % cream Apply 1 application topically daily as needed (for wound care- both legs).     . traZODone (DESYREL) 100 MG tablet Take 100 mg by mouth at bedtime.     Marland Kitchen warfarin (COUMADIN) 5 MG tablet 5 mg daily, and 2.5 mg on Mondays (Patient taking differently: Take 5 mg by mouth. ) 135 tablet 0    Assessment: 58 y.o. male admitted with CHF exacerbation, h/o mechanical AVR, MVR, Afib, to continue warfarin.  INR down to 2.4 yesterday, and heparin bridge was initiated.  INR back up to 2.7 today, so heparin was stopped early this morning.  Patient has been been taking Coumadin 5mg  daily prior to admission.   Goal of Therapy:  Heparin level 0.3-0.7 units/mL INR 2.5-3.5 Monitor platelets by anticoagulation protocol: Yes   Plan:  Coumadin 7.5 mg x 1 tonight, will likely go back to home dose of 5 mg daily tomorrow. Daily INR.  Marguerite Olea, [facility] Clinical Pharmacist Phone [phone]  04/05/2019 7:41 AM

## 2019-04-05 NOTE — Progress Notes (Signed)
Pt c/o chest pain, HR 140-150's, SOB, non rebreather applied.

## 2019-04-05 NOTE — Progress Notes (Signed)
Paged amy clegg NP pt having SOB, chest pain post feraheme, stopped feraheme. Waiting for call back

## 2019-04-05 NOTE — Anesthesia Postprocedure Evaluation (Signed)
Anesthesia Post Note  Patient: Lance Cook  Procedure(s) Performed: TRANSESOPHAGEAL ECHOCARDIOGRAM (TEE) (N/A ) BUBBLE STUDY     Patient location during evaluation: Endoscopy Anesthesia Type: MAC Level of consciousness: awake and alert Pain management: pain level controlled Vital Signs Assessment: post-procedure vital signs reviewed and stable Respiratory status: spontaneous breathing, nonlabored ventilation, respiratory function stable and patient connected to nasal cannula oxygen Cardiovascular status: stable and blood pressure returned to baseline Postop Assessment: no apparent nausea or vomiting Anesthetic complications: no    Last Vitals:  Vitals:   04/05/19 0100 04/05/19 0730  BP: 115/69 109/72  Pulse: (!) 113 100  Resp: (!) 23 (!) 21  Temp: 36.9 C 36.6 C  SpO2: 99% 92%    Last Pain:  Vitals:   04/05/19 0730  TempSrc: Oral  PainSc:                  [name]

## 2019-04-05 NOTE — Progress Notes (Signed)
Wife, Lance Cook, called saying "the state" is calling her asking questions because someone called them after seeing an ambulance at her house.  She stated that she had given them the information that they needed.  She does not want anyone to get information about her husband if they should call the hospital.  I quickly discussed her situation and it sounds like she is legally blind, but has some close by friends that come by and help her when she needs it.  I also explained that we would only be obligated to speak with someone from a state agency without her or the patients permission if we suspected abuse/neglect ect.  We don't have ANY indication that there is anything like that going on, therefore I don't know of any other reason that we would have to give out information to anyone but her.    Karsten Ro, RN

## 2019-04-06 LAB — CBC WITH DIFFERENTIAL/PLATELET
Abs Immature Granulocytes: 0.24 10*3/uL — ABNORMAL HIGH (ref 0.00–0.07)
Basophils Absolute: 0 10*3/uL (ref 0.0–0.1)
Basophils Relative: 0 %
Eosinophils Absolute: 0 10*3/uL (ref 0.0–0.5)
Eosinophils Relative: 0 %
HCT: 31.4 % — ABNORMAL LOW (ref 39.0–52.0)
Hemoglobin: 9.1 g/dL — ABNORMAL LOW (ref 13.0–17.0)
Immature Granulocytes: 1 %
Lymphocytes Relative: 1 %
Lymphs Abs: 0.2 10*3/uL — ABNORMAL LOW (ref 0.7–4.0)
MCH: 21.7 pg — ABNORMAL LOW (ref 26.0–34.0)
MCHC: 29 g/dL — ABNORMAL LOW (ref 30.0–36.0)
MCV: 74.8 fL — ABNORMAL LOW (ref 80.0–100.0)
Monocytes Absolute: 0.9 10*3/uL (ref 0.1–1.0)
Monocytes Relative: 4 %
Neutro Abs: 22 10*3/uL — ABNORMAL HIGH (ref 1.7–7.7)
Neutrophils Relative %: 94 %
Platelets: 144 10*3/uL — ABNORMAL LOW (ref 150–400)
RBC: 4.2 MIL/uL — ABNORMAL LOW (ref 4.22–5.81)
RDW: 22.7 % — ABNORMAL HIGH (ref 11.5–15.5)
WBC: 23.3 10*3/uL — ABNORMAL HIGH (ref 4.0–10.5)
nRBC: 0 % (ref 0.0–0.2)

## 2019-04-06 LAB — BASIC METABOLIC PANEL
Anion gap: 12 (ref 5–15)
BUN: 32 mg/dL — ABNORMAL HIGH (ref 6–20)
CO2: 35 mmol/L — ABNORMAL HIGH (ref 22–32)
Calcium: 7.3 mg/dL — ABNORMAL LOW (ref 8.9–10.3)
Chloride: 84 mmol/L — ABNORMAL LOW (ref 98–111)
Creatinine, Ser: 1.13 mg/dL (ref 0.61–1.24)
GFR calc Af Amer: >60 mL/min (ref >60)
GFR calc non Af Amer: >60 mL/min (ref >60)
Glucose, Bld: 217 mg/dL — ABNORMAL HIGH (ref 70–99)
Potassium: 3.8 mmol/L (ref 3.5–5.1)
Sodium: 131 mmol/L — ABNORMAL LOW (ref 135–145)

## 2019-04-06 LAB — COOXEMETRY PANEL
Carboxyhemoglobin: 1.9 % — ABNORMAL HIGH (ref 0.5–1.5)
Methemoglobin: 1 % (ref 0.0–1.5)
O2 Saturation: 71 %
Total hemoglobin: 8.8 g/dL — ABNORMAL LOW (ref 12.0–16.0)

## 2019-04-06 LAB — GLUCOSE, CAPILLARY
Glucose-Capillary: 128 mg/dL — ABNORMAL HIGH (ref 70–99)
Glucose-Capillary: 108 mg/dL — ABNORMAL HIGH (ref 70–99)
Glucose-Capillary: 145 mg/dL — ABNORMAL HIGH (ref 70–99)
Glucose-Capillary: 98 mg/dL (ref 70–99)

## 2019-04-06 LAB — MAGNESIUM: Magnesium: 1.9 mg/dL (ref 1.7–2.4)

## 2019-04-06 LAB — PROTIME-INR
INR: 3.2 — ABNORMAL HIGH (ref 0.8–1.2)
Prothrombin Time: 31.9 seconds — ABNORMAL HIGH (ref 11.4–15.2)

## 2019-04-06 MED ORDER — SODIUM CHLORIDE 0.9% FLUSH
3.0000 mL | Freq: Two times a day (BID) | INTRAVENOUS | Status: DC
  Administered 2019-04-06: 23:00:00 3 mL via INTRAVENOUS

## 2019-04-06 MED ORDER — POTASSIUM CHLORIDE CRYS ER 20 MEQ PO TBCR
40.0000 meq | EXTENDED_RELEASE_TABLET | Freq: Four times a day (QID) | ORAL | Status: DC
  Administered 2019-04-06 – 2019-04-08 (×9): 40 meq via ORAL
  Filled 2019-04-06 (×9): qty 2

## 2019-04-06 MED ORDER — MIDODRINE HCL 5 MG PO TABS
5.0000 mg | ORAL_TABLET | Freq: Three times a day (TID) | ORAL | Status: DC
  Administered 2019-04-06 – 2019-04-11 (×15): 5 mg via ORAL
  Filled 2019-04-06 (×15): qty 1

## 2019-04-06 MED ORDER — MAGNESIUM SULFATE 2 GM/50ML IV SOLN
2.0000 g | Freq: Once | INTRAVENOUS | Status: AC
  Administered 2019-04-06: 2 g via INTRAVENOUS
  Filled 2019-04-06: qty 50

## 2019-04-06 MED ORDER — WARFARIN SODIUM 5 MG PO TABS: 5.0000 mg | ORAL_TABLET | Freq: Once | ORAL | Status: DC

## 2019-04-06 MED ORDER — SODIUM CHLORIDE 0.9 % IV SOLN: 250.0000 mL | INTRAVENOUS | Status: DC | PRN

## 2019-04-06 MED ORDER — SODIUM CHLORIDE 0.9 % IV SOLN
INTRAVENOUS | Status: DC
  Administered 2019-04-07: 06:00:00 via INTRAVENOUS

## 2019-04-06 MED ORDER — METOLAZONE 5 MG PO TABS
5.0000 mg | ORAL_TABLET | Freq: Once | ORAL | Status: AC
  Administered 2019-04-06: 09:00:00 5 mg via ORAL
  Filled 2019-04-06: qty 1

## 2019-04-06 MED ORDER — ASPIRIN 81 MG PO CHEW
81.0000 mg | CHEWABLE_TABLET | ORAL | Status: AC
  Administered 2019-04-07: 06:00:00 81 mg via ORAL
  Filled 2019-04-06: qty 1

## 2019-04-06 MED ORDER — SODIUM CHLORIDE 0.9% FLUSH: 3.0000 mL | INTRAVENOUS | Status: DC | PRN

## 2019-04-06 MED ORDER — CHLORHEXIDINE GLUCONATE CLOTH 2 % EX PADS
6.0000 | MEDICATED_PAD | Freq: Every day | CUTANEOUS | Status: DC
  Administered 2019-04-07 – 2019-04-10 (×5): 6 via TOPICAL

## 2019-04-06 NOTE — Progress Notes (Addendum)
Patient ID: Lance Cook, male   DOB: 02/13/1961, 58 y.o.   MRN: [id number]     Advanced Heart Failure Rounding Note  PCP-Cardiologist: [name], MD   Subjective:    TEE showed markedly dilated LA, LV with EF 50%, septal-lateral dyssynchrony.  Normal RV systolic size/function.  Mechanical MV with mean gradient 5 and mild peri-valvular regurgitation. Mechanical AoV with mean gradient 23, appeared to open well.   Yesterday he had severe reaction after starting Feraheme. Required 120 mg solumedrol and benadryl. Placed on Bipap, amio drip for rate control, and Neo for hypotension.  WBCs elevated but afebrile.   Blood CX: Pending.   Feeling better today. Ongoing back pain. Denies SOB.   Objective:   Weight Range: 92.4 kg Body mass index is 39.78 kg/m.   Vital Signs:   Temp:  [97.5 F (36.4 C)-99 F (37.2 C)] 97.9 F (36.6 C) (08/05 0640) Pulse Rate:  [42-145] 77 (08/05 0645) Resp:  [16-38] 21 (08/05 0645) BP: (71-134)/(50-112) 98/82 (08/05 0645) SpO2:  [80 %-100 %] 100 % (08/05 0645) FiO2 (%):  [40 %] 40 % (08/04 1136) Weight:  [92.4 kg] 92.4 kg (08/05 0500) Last BM Date: 04/05/19  Weight change: Filed Weights   04/03/19 0334 04/05/19 0100 04/06/19 0500  Weight: 97.6 kg 92.4 kg 92.4 kg    Intake/Output:   Intake/Output Summary (Last 24 hours) at 04/06/2019 0710 Last data filed at 04/06/2019 0600 Gross per 24 hour  Intake 2128.76 ml  Output 2250 ml  Net -121.24 ml      Physical Exam  CVP 13-14   General:   No resp difficulty HEENT: normal Neck: supple. JVP 12-13 . Carotids 2+ bilat; no bruits. No lymphadenopathy or thryomegaly appreciated. Cor: PMI nondisplaced. Irregular  rate & rhythm. No rubs, gallops .   Lungs: clear on 2 liters oxygen  Abdomen: soft, nontender, nondistended. No hepatosplenomegaly. No bruits or masses. Good bowel sounds. Extremities: no cyanosis, clubbing, rash, R and LLE unna boots. Neuro: alert & orientedx3, cranial nerves grossly intact.  moves all 4 extremities w/o difficulty. Affect pleasant   Telemetry   A fib 70-80s   Labs    CBC Recent Labs    04/05/19 0425 04/05/19 1222 04/05/19 2142 04/06/19 0440  WBC 11.3* 18.4*  --  23.3*  NEUTROABS 9.8*  --   --  22.0*  HGB 9.5* 11.6* 11.6* 9.1*  HCT 32.7* 41.2 34.0* 31.4*  MCV 72.8* 74.5*  --  74.8*  PLT 225 150  --  341*   Basic Metabolic Panel Recent Labs    04/05/19 0425  04/05/19 2124 04/05/19 2142 04/06/19 0440  NA 131*   < > 128* 126* 131*  K 4.1   < > 4.4 4.4 3.8  CL 84*   < > 82*  --  84*  CO2 37*   < > 36*  --  35*  GLUCOSE 94   < > 270*  --  217*  BUN 30*   < > 33*  --  32*  CREATININE 1.02   < > 1.29*  --  1.13  CALCIUM 8.0*   < > 7.5*  --  7.3*  MG 2.1  --   --   --  1.9   < > = values in this interval not displayed.   Liver Function Tests Recent Labs    04/05/19 1222  AST 94*  ALT 30  ALKPHOS 87  BILITOT 1.3*  PROT 4.8*  ALBUMIN 1.9*   No results  for input(s): LIPASE, AMYLASE in the last 72 hours. Cardiac Enzymes No results for input(s): CKTOTAL, CKMB, CKMBINDEX, TROPONINI in the last 72 hours.  BNP: BNP (last 3 results) Recent Labs    02/13/19 1504 04/01/19 2157  BNP 223.8* 302.1*    ProBNP (last 3 results) No results for input(s): PROBNP in the last 8760 hours.   D-Dimer No results for input(s): DDIMER in the last 72 hours. Hemoglobin A1C No results for input(s): HGBA1C in the last 72 hours. Fasting Lipid Panel No results for input(s): CHOL, HDL, LDLCALC, TRIG, CHOLHDL, LDLDIRECT in the last 72 hours. Thyroid Function Tests No results for input(s): TSH, T4TOTAL, T3FREE, THYROIDAB in the last 72 hours.  Invalid input(s): FREET3  Other results:   Imaging    Dg Chest Port 1 View  Result Date: 04/05/2019 CLINICAL DATA:  PICC line placement EXAM: PORTABLE CHEST 1 VIEW COMPARISON:  April 05, 2019 FINDINGS: The heart size remains enlarged. The patient is status post prior median sternotomy and valve replacement.  There is a small right-sided pleural effusion with elevation of the right hemidiaphragm. The right-sided PICC line is well positioned with the tip terminating near the cavoatrial junction. There is persistent vascular congestion with pulmonary edema. There is likely compressive atelectasis at the right lung base. IMPRESSION: 1. Well-positioned right-sided PICC line as above. 2. Otherwise, stable appearance of the chest. Electronically Signed   By: Constance Holster M.D.   On: 04/05/2019 16:31   Dg Chest Port 1 View  Result Date: 04/05/2019 CLINICAL DATA:  Short of breath EXAM: PORTABLE CHEST 1 VIEW COMPARISON:  04/01/2019 FINDINGS: Cardiac enlargement with mitral valve replacement. Progression of vascular congestion. Progression of mild bilateral airspace disease right greater than left which may be edema. Small right effusion. IMPRESSION: Progression of vascular congestion and pulmonary edema. Electronically Signed   By: Franchot Gallo M.D.   On: 04/05/2019 10:36   [place] Ekg Site Rite  Result Date: 04/05/2019 If Site Rite image not attached, placement could not be confirmed due to current cardiac rhythm.    Medications:     Scheduled Medications:  allopurinol  300 mg Oral Daily   aspirin EC  81 mg Oral Daily   Chlorhexidine Gluconate Cloth  6 each Topical Daily   doxycycline  100 mg Oral Q12H   ferrous sulfate  325 mg Oral TID WC   gabapentin  200 mg Oral TID   insulin aspart  0-15 Units Subcutaneous TID WC   insulin aspart  0-5 Units Subcutaneous QHS   magnesium oxide  400 mg Oral BID   methocarbamol  500 mg Oral Q8H   potassium chloride  40 mEq Oral TID   sodium chloride flush  3 mL Intravenous Q12H   spironolactone  25 mg Oral Daily   traZODone  100 mg Oral QHS   Warfarin - Pharmacist Dosing Inpatient   Does not apply q1800    Infusions:  sodium chloride     amiodarone 30 mg/hr (04/06/19 0600)   furosemide (LASIX) infusion 15 mg/hr (04/06/19 0615)    phenylephrine (NEO-SYNEPHRINE) Adult infusion 20 mcg/min (04/06/19 3557)    PRN Medications: sodium chloride, acetaminophen, mupirocin ointment, ondansetron (ZOFRAN) IV, oxyCODONE, silver sulfADIAZINE, sodium chloride flush  Assessment/Plan   1. Acute on chronic diastolic CHF:  Last echo in 6/20 showed preserved EF 55-60% with septal bounce from LBBB. Mean gradient was elevated across aortic valve at 30 mmHg (though not markedly elevated), and there was suggestion of mild peri-valvular regurgitation at mitral  position.  On TEE this admission, EF 50% with septal-lateral dyssynchrony (c/w LBBB), normal-appearing RV, mechanical MV with mild peri-valvular regurgitation and mean gradient 5, mechanical AoV with mean gradient 23 mmHg.  He has significantly decompensated over the last several months, now with marked diuretic resistance.  His atrial fibrillation is not new.  Dysfunction of his mechanical valves does not appear to explain his worsening CHF. 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   - CVP 13-14. Continue lasix drip 15 mg per hour. Give 5 mg metolazone now.   - Continue spironolactone and K replacement.  K is normal range this morning. - RHC tomorrow, ?restrictive cardiomyopathy.  Discussed risks/benefits with patient and he agrees to procedure.  - On small dose of phenylephrine from event yesterday, think we can stop today.  - Unna boots.  2. Valvular heart disease: Patient has mechanical mitral and aortic valves.  TEE showed mechanical MV with mean gradient 5 mmHg, mild peri-valvular regurgitation.  The aortic valve appeared to open reasonably well but mean gradient was 23 mmHg, ?component of mild patient-prosthesis mismatch.  No indication for any sort of intervention.  - Continue ASA 81 and warfarin with INR goal 2.5-3.5. INR 3.2 . 3. Atrial fibrillation: Chronic.  8/4 Placed on amio drip for rate control. Controlled today. Stop amio drip.   Long-standing atrial  fibrillation with severely dilated atria, unlikely to have a successful cardioversion.  4. CAD: Last cath in 4/16 with patent native coronaries, probably occluded SVG-RCA. Doubt CAD as cause of his decompensation.  5. Venous stasis ulceration: Noted on lower legs.  - He was started on clindamycin initially for ?of co-existing cellulitis.  Given history of recurrent C difficile he was switched to doxycycline.   6. Type 2 diabetes: SSI.  7. OSA: CPAP.  8. Hypokalemia: Resolved with current regimen.  9. Hypomagnesia: Give 2 grams Mag.  10. Anemia: received feraheme 02/22/2019 and again on 04/05/19 but had severe reaction to feraheme. Given 120 mg solumedrol, benadryl, and placed on Bipap.  - Hgb stable.   11. Hyponatremia  - Sodium 131. 12. ID: WBC trending up 11>18>23. Blood CX pending.  - Got high dose Solumedrol yesterday, may be cause of high WBCs.  13. Acute on Chronic Hypoxic Respiratory Failure- On Bipap yesterday. Now on 2 liters with stable saturation.  14. Deconditioning PT/OT follow up.   Length of Stay: 5  [name], NP  04/06/2019, 7:10 AM  Advanced Heart Failure Team Pager [phone] (M-F; [place])  Please contact [facility] for night-coverage after hours (4p -7a ) and weekends on [email or web address]  Patient seen with NP, agree with the above note.   He had a difficult day yesterday, had apparent allergic reaction to [facility] with hypotension, dyspnea, tachycardia.  He was started on phenylephrine and amiodarone gtt and received Benadryl and Solumedrol.  Much improved today, HR in 70s and only on phenylephrine 10.  Creatinine stable.  CVP 13-14. WBCs elevated to 23 with no fever.   General: NAD Neck: JVP 14 cm, no thyromegaly or thyroid nodule.  Lungs: Decreased at bases.  CV: Nondisplaced PMI.  Heart irregular S1/S2 with mechanical S1S2, no S3/S4, 2/6 HSM LLSB.  1+ edema to knees.   Abdomen: Soft, nontender, no hepatosplenomegaly, no distention.  Skin: Intact without lesions or  rashes.  Neurologic: Alert and oriented x 3.  Psych: Normal affect. Extremities: No clubbing or cyanosis.  HEENT: Normal.   He seems to be recovering from his suspected feraheme  reaction.  Now need to continue diuresis. CVP 13-14 today with stable creatinine.  - Continue Lasix gtt 15 mg/hr and will give metolazone 5 mg x 1.  Repeat BMET in after to replace K if needed.  - Suspect restrictive cardiomyopathy, will plan RHC tomorrow.  Discussed risks/benefits with patient and he agrees to procedure.  - Can stop phenylephrine today.   WBCs elevated, think from yesterday's high dose Solumedrol.  No fever.   Amiodarone gtt started yesterday for rate control, HR in 70s so think we can stop.   CRITICAL CARE Performed by: Loralie Champagne  Total critical care time: 40 minutes  Critical care time was exclusive of separately billable procedures and treating other patients.  Critical care was necessary to treat or prevent imminent or life-threatening deterioration.  Critical care was time spent personally by me on the following activities: development of treatment plan with patient and/or surrogate as well as nursing, discussions with consultants, evaluation of patient's response to treatment, examination of patient, obtaining history from patient or surrogate, ordering and performing treatments and interventions, ordering and review of laboratory studies, ordering and review of radiographic studies, pulse oximetry and re-evaluation of patient's condition.  Loralie Champagne 04/06/2019 7:53 AM

## 2019-04-06 NOTE — Progress Notes (Signed)
Occupational Therapy Treatment Patient Details Name: Lance Cook MRN: [id number] DOB: 1961/02/02 Today's Date: 04/06/2019    History of present illness 58 yo admitted with HF exacerbation. PMHx: CHF, AVR, MVR, CAD, AFib, AA dissection, DM, HTN, PVD, testicular CA, CVA at 58yo   OT comments  Patient progressing. On 2L oxygen via [place] throughout session with SpO2 100%, BP soft see below for details.  Completing bed mobility with min assist, transfers with min guard using RW and short distance mobility with min guard to close supervision using RW.  Will continue to follow to increase activity tolerance and endurance.    Follow Up Recommendations  No OT follow up    Equipment Recommendations  None recommended by OT    Recommendations for Other Services      Precautions / Restrictions Precautions Precautions: Fall Restrictions Weight Bearing Restrictions: No       Mobility Bed Mobility Overal bed mobility: Needs Assistance Bed Mobility: Supine to Sit     Supine to sit: Min assist     General bed mobility comments: min assist HHA to elevate trunk   Transfers Overall transfer level: Needs assistance Equipment used: Rolling walker (2 wheeled) Transfers: Sit to/from Stand Sit to Stand: Min guard         General transfer comment: min guard for safety and balance, using both hands on RW while therapist stabilized    Balance Overall balance assessment: Needs assistance Sitting-balance support: No upper extremity supported;Feet supported Sitting balance-Leahy Scale: Good     Standing balance support: Bilateral upper extremity supported;During functional activity Standing balance-Leahy Scale: Poor Standing balance comment: bil UE support for gait                           ADL either performed or assessed with clinical judgement   ADL Overall ADL's : Needs assistance/impaired     Grooming: Set up;Sitting                   Toilet Transfer: Min  guard;Ambulation;RW   Toileting- Water quality scientist and Hygiene: Min guard;Supervision/safety;Sit to/from stand Toileting - Clothing Manipulation Details (indicate cue type and reason): min guard to supervision for simulated toileting (adjusting urinal pouch) in standing      Functional mobility during ADLs: Min guard;Supervision/safety;Rolling walker General ADL Comments: min guard to close supervision for safety      Vision       Perception     Praxis      Cognition Arousal/Alertness: Awake/alert Behavior During Therapy: WFL for tasks assessed/performed Overall Cognitive Status: Within Functional Limits for tasks assessed                                          Exercises     Shoulder Instructions       General Comments BP: 83/58 supine, 82/59 EOB, 95/68 standing, 95/66 after mobility seated; on 2L [place] 100% SPo2, HR 85-88    Pertinent Vitals/ Pain       Pain Assessment: No/denies pain  Home Living                                          Prior Functioning/Environment  Frequency  Min 2X/week        Progress Toward Goals  OT Goals(current goals can now be found in the care plan section)  Progress towards OT goals: Progressing toward goals  Acute Rehab OT Goals Patient Stated Goal: return home with my wife OT Goal Formulation: With patient  Plan Discharge plan remains appropriate;Frequency remains appropriate    Co-evaluation                 AM-PAC OT "6 Clicks" Daily Activity     Outcome Measure   Help from another person eating meals?: None Help from another person taking care of personal grooming?: A Little Help from another person toileting, which includes using toliet, bedpan, or urinal?: A Little Help from another person bathing (including washing, rinsing, drying)?: A Lot Help from another person to put on and taking off regular upper body clothing?: A Little Help from another person  to put on and taking off regular lower body clothing?: A Lot 6 Click Score: 17    End of Session Equipment Utilized During Treatment: Rolling walker;Oxygen(2L)  OT Visit Diagnosis: Unsteadiness on feet (R26.81);Other abnormalities of gait and mobility (R26.89);Other (comment)(decreased activity tolerance)   Activity Tolerance Patient tolerated treatment well   Patient Left in chair;with call bell/phone within reach   Nurse Communication Mobility status        Time: 5176-1607 OT Time Calculation (min): 26 min  Charges: OT General Charges $OT Visit: 1 Visit OT Treatments $Self Care/Home Management : 8-22 mins $Therapeutic Activity: 8-22 mins  Delight Stare, OT [place] Pager [phone] Office [phone]     Delight Stare 04/06/2019, 9:26 AM

## 2019-04-06 NOTE — Progress Notes (Signed)
[place] for Coumadin Indication: AVR/MVR/Afib  Allergies  Allergen Reactions  . Feraheme [Ferumoxytol] Shortness Of Breath    SOB/Tachycardia  . Penicillins Hives and Rash    Has patient had a PCN reaction causing immediate rash, facial/tongue/throat swelling, SOB or lightheadedness with hypotension: Yes Has patient had a PCN reaction causing severe rash involving mucus membranes or skin necrosis: Yes Has patient had a PCN reaction that required hospitalization unsure Has patient had a PCN reaction occurring within the last 10 years: 2010-2012 If all of the above answers are "NO", then may proceed with Cephalosporin use.  [name] Other (See Comments)    CDIFF, when taking oral tablets   . Tape Other (See Comments) and Itching    Skin irritation    Patient Measurements: Height: 5' (152.4 cm) Weight: 203 lb 11.3 oz (92.4 kg) IBW/kg (Calculated) : 50  Vital Signs: Temp: 97.9 F (36.6 C) (08/05 0640) Temp Source: Axillary (08/05 0640) BP: 98/82 (08/05 0645) Pulse Rate: 77 (08/05 0645)  Labs: Recent Labs    04/04/19 0243  04/04/19 1905 04/05/19 0425 04/05/19 1222 04/05/19 2124 04/05/19 2142 04/06/19 0440  HGB 9.3*  --   --  9.5* 11.6*  --  11.6* 9.1*  HCT 32.3*  --   --  32.7* 41.2  --  34.0* 31.4*  PLT 232  --   --  225 150  --   --  144*  LABPROT 26.0*  --   --  28.2*  --   --   --  31.9*  INR 2.4*  --   --  2.7*  --   --   --  3.2*  HEPARINUNFRC  --   --  <0.10* <0.10*  --   --   --   --   CREATININE 0.97   < >  --  1.02 1.28* 1.29*  --  1.13   < > = values in this interval not displayed.    Estimated Creatinine Clearance: 68.4 mL/min (by C-G formula based on SCr of 1.13 mg/dL).   Medical History: Past Medical History:  Diagnosis Date  . Acute on chronic diastolic CHF (congestive heart failure) ([place]) 08/27/2016  . Anemia   . Anxiety   . ARDS (adult respiratory distress syndrome) ([place])  01/27/2015  . Arthritis   . Ascending aortic dissection ([place]) 07/14/2008   Localized dissection of ascending aorta noted on CTA in 2009 and stable on CTA in 2011  . Asymptomatic chronic venous hypertension 01/15/2010   Overview:  Overview:  Qualifier: Diagnosis of  By: [name] MD, [name]   Last Assessment & Plan:  I suggested he go to a vein clinic to see if he could be qualified compression hose of some type, since he is not a surgical candidate according to the vascular surgeons.   . Atrial fibrillation ([place])    chronic persistent  . Bell's palsy   . C. difficile diarrhea   . CAD (coronary artery disease)    Old scar inferior wall myoview, 10/2009 EF 52%.  He did have previous SVG to RCA but no obstructive disease noted on his most recent cath.  SVG occluded.   . Cellulitis 04/02/2014  . Cerebral artery occlusion with cerebral infarction ([place]) 10/08/2011   Overview:  Overview:  And hx of TIA prior to CABG, all thought due to systemic emboli prior to coumadin   . Chronic diastolic heart failure ([place]) 03/01/2015   a. 12/2015: echo showing a preserved  EF of 65-70%, moderate AS, and moderate TR.   Marland Kitchen Chronic LBP 10/08/2011  . CVA (cerebral vascular accident) ([place]) 10/08/2011   And hx of TIA prior to CABG, all thought due to systemic emboli prior to coumadin   . DIABETES MELLITUS, TYPE II 11/01/2009   Qualifier: Diagnosis of  By: [name] MD, [name]    . Diverticulosis   . 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 Encephalopathy acute 02/05/2015  . Gastric polyps   . GERD (gastroesophageal reflux disease)    not needing medication at thhis time- 01/22/15  . Gout   . H/O mechanical aortic valve replacement 11/01/2009   Qualifier: Diagnosis of  By: [name], [name]    . Hiatal hernia   . History of colon polyps 12/23/2011   Overview:  Overview:  Colonoscopy January 2010,  6 mm rectal tubulovillous adenoma. No high-grade dysplasia   . HTN (hypertension) 02/23/2014  . Hyperlipidemia   . Hypertension   . Hypogonadism male 04/08/2012  . Impaired glucose tolerance 10/08/2011  . Morbid obesity ([place]) 04/02/2014  . Myocardial infarction [facility]) age 37  . OSA (obstructive sleep apnea)    CPAP  . Peripheral vascular disease ([place])   . 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 9/50/9326   33 mm St Jude bileaflet mechanical valve placed via right mini thoracotomy approach  . 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    1988 - Dr Blase Mess at [facility] in [place], [place]  . Severe mitral regurgitation 10/11/2014  . Testicular cancer [facility])    He was 58 y/o. He had surgical resection and rad tx's.   Marland Kitchen TIA (transient ischemic attack)    age 58  . Tubulovillous adenoma of colon   . Varicose veins   . 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 Venous (peripheral) insufficiency 02/03/2005    Medications:  No current facility-administered medications on file prior to encounter.    Current Outpatient Medications on File Prior to Encounter  Medication Sig Dispense Refill  . allopurinol (ZYLOPRIM) 300 MG tablet Take 300 mg by mouth daily.    Marland Kitchen aspirin EC 81 MG EC tablet Take 1 tablet (81 mg total) by mouth daily. 30 tablet 0  . bumetanide (BUMEX) 2 MG tablet Take 1 tablet (2 mg total) by mouth 2 (two) times daily. (Patient taking differently: Take 4 mg by mouth 2 (two) times daily. ) 60 tablet 0  . gabapentin (NEURONTIN) 300 MG capsule Take 300 mg by mouth 3 (three) times daily.    . magnesium oxide (MAG-OX) 400 (241.3 MG) MG tablet Take 1 tablet (400 mg total) by mouth 2 (two) times daily. (Patient taking differently: Take 400 mg by mouth 3 (three) times daily. ) 180 tablet 3  . methocarbamol (ROBAXIN) 500 MG tablet Take 1 tablet (500 mg total) by mouth 3 (three) times daily. 270 tablet 0  .  metolazone (ZAROXOLYN) 5 MG tablet Take 10 mg by mouth every morning.    . metoprolol succinate (TOPROL-XL) 25 MG 24 hr tablet Take 1.5 tablets (37.5 mg total) by mouth 2 (two) times daily. 90 tablet 0  . mupirocin ointment (BACTROBAN) 2 % Apply 1 application topically 3 (three) times daily.     Marland Kitchen oxyCODONE (OXY IR/ROXICODONE) 5 MG immediate release tablet Take 1 tablet (5  mg total) by mouth every 8 (eight) hours as needed for severe pain. (Patient taking differently: Take 5 mg by mouth 3 (three) times daily. ) 90 tablet 0  . potassium chloride SA (K-DUR) 20 MEQ tablet Take 2 tablets (40 mEq total) by mouth 3 (three) times daily. 180 tablet 0  . PRESCRIPTION MEDICATION See admin instructions. CPAP- At bedtime and during any time of rest    . silver sulfADIAZINE (SILVADENE) 1 % cream Apply 1 application topically daily as needed (for wound care- both legs).     . traZODone (DESYREL) 100 MG tablet Take 100 mg by mouth at bedtime.     Marland Kitchen warfarin (COUMADIN) 5 MG tablet 5 mg daily, and 2.5 mg on Mondays (Patient taking differently: Take 5 mg by mouth. ) 135 tablet 0    Assessment: 58 y.o. male admitted with CHF exacerbation, h/o mechanical AVR, MVR, Afib, to continue warfarin.  INR up to 3.2 today, so heparin was stopped early this morning.  Patient has been been taking Coumadin 5mg  daily prior to admission.   Goal of Therapy:  Heparin level 0.3-0.7 units/mL INR 2.5-3.5 Monitor platelets by anticoagulation protocol: Yes   Plan:  Hold Coumadin tonight in anticipation of RHC tomorrow. Daily INR.  Marguerite Olea, [facility] Clinical Pharmacist Phone [phone]  04/06/2019 7:37 AM

## 2019-04-06 NOTE — Progress Notes (Signed)
   Ongoing difficulty with hypotension and inability to wean Neo.   Neo currently at 5 mcg. Will start 5 mg midodrine three times a day with plan to stop neo.   [name] NP-C  1:12 PM

## 2019-04-06 NOTE — Care Plan (Signed)
Cardiology Care Plan:  Increasingly lethargic overnight per RN. BMP and VBG notable for acute hyponatremia (132->126) with mixed metabolic alkalosis (Cl 82) and respiratory acidosis with overall normal pH, as well as borderline AKI with relatively poor UOP today. RN placed on BiPAP and on my assessment he apparently was improved as he was more alert. JVP clearly remains elevated and extremities still edematous. Suspect mild encephalopathy may be due to acute hyponatremia (likely from Diamox) and possibly gabapentin accumulation in setting of borderline AKI, maybe some degree of hypoactive delirium.   Held Diamox for now, held gabapentin tonight and dose adjusted, Robaxin held tonight. Continue Lasix ggt. Na and Cr this morning are improved. Mentation overall seems improved as well.

## 2019-04-07 ENCOUNTER — Encounter (HOSPITAL_COMMUNITY): Payer: Self-pay | Admitting: Cardiology

## 2019-04-07 ENCOUNTER — Encounter (HOSPITAL_COMMUNITY)
Admission: EM | Disposition: A | Discharge status: Home Health | Payer: Self-pay | Source: Home / Self Care | Attending: Cardiology

## 2019-04-07 DIAGNOSIS — E876 Hypokalemia: Secondary | ICD-10-CM

## 2019-04-07 DIAGNOSIS — I509 Heart failure, unspecified: Secondary | ICD-10-CM | POA: Diagnosis not present

## 2019-04-07 HISTORY — PX: RIGHT HEART CATH: CATH118263

## 2019-04-07 LAB — POCT I-STAT EG7
Acid-Base Excess: 14 mmol/L — ABNORMAL HIGH (ref 0.0–2.0)
Acid-Base Excess: 15 mmol/L — ABNORMAL HIGH (ref 0.0–2.0)
Bicarbonate: 41.1 mmol/L — ABNORMAL HIGH (ref 20.0–28.0)
Bicarbonate: 42.2 mmol/L — ABNORMAL HIGH (ref 20.0–28.0)
Calcium, Ion: 1.09 mmol/L — ABNORMAL LOW (ref 1.15–1.40)
Calcium, Ion: 1.13 mmol/L — ABNORMAL LOW (ref 1.15–1.40)
HCT: 32 % — ABNORMAL LOW (ref 39.0–52.0)
HCT: 33 % — ABNORMAL LOW (ref 39.0–52.0)
Hemoglobin: 10.9 g/dL — ABNORMAL LOW (ref 13.0–17.0)
Hemoglobin: 11.2 g/dL — ABNORMAL LOW (ref 13.0–17.0)
O2 Saturation: 67 %
O2 Saturation: 70 %
Potassium: 3.5 mmol/L (ref 3.5–5.1)
Potassium: 3.6 mmol/L (ref 3.5–5.1)
Sodium: 130 mmol/L — ABNORMAL LOW (ref 135–145)
Sodium: 131 mmol/L — ABNORMAL LOW (ref 135–145)
TCO2: 43 mmol/L — ABNORMAL HIGH (ref 22–32)
TCO2: 44 mmol/L — ABNORMAL HIGH (ref 22–32)
pCO2, Ven: 65.9 mmHg — ABNORMAL HIGH (ref 44.0–60.0)
pCO2, Ven: 66.1 mmHg — ABNORMAL HIGH (ref 44.0–60.0)
pH, Ven: 7.403 (ref 7.250–7.430)
pH, Ven: 7.413 (ref 7.250–7.430)
pO2, Ven: 36 mmHg (ref 32.0–45.0)
pO2, Ven: 38 mmHg (ref 32.0–45.0)

## 2019-04-07 LAB — CBC WITH DIFFERENTIAL/PLATELET
Abs Immature Granulocytes: 0.13 10*3/uL — ABNORMAL HIGH (ref 0.00–0.07)
Basophils Absolute: 0 10*3/uL (ref 0.0–0.1)
Basophils Relative: 0 %
Eosinophils Absolute: 0.2 10*3/uL (ref 0.0–0.5)
Eosinophils Relative: 1 %
HCT: 33.2 % — ABNORMAL LOW (ref 39.0–52.0)
Hemoglobin: 9.5 g/dL — ABNORMAL LOW (ref 13.0–17.0)
Immature Granulocytes: 1 %
Lymphocytes Relative: 2 %
Lymphs Abs: 0.3 10*3/uL — ABNORMAL LOW (ref 0.7–4.0)
MCH: 21.3 pg — ABNORMAL LOW (ref 26.0–34.0)
MCHC: 28.6 g/dL — ABNORMAL LOW (ref 30.0–36.0)
MCV: 74.3 fL — ABNORMAL LOW (ref 80.0–100.0)
Monocytes Absolute: 0.8 10*3/uL (ref 0.1–1.0)
Monocytes Relative: 5 %
Neutro Abs: 12.7 10*3/uL — ABNORMAL HIGH (ref 1.7–7.7)
Neutrophils Relative %: 91 %
Platelets: 128 10*3/uL — ABNORMAL LOW (ref 150–400)
RBC: 4.47 MIL/uL (ref 4.22–5.81)
RDW: 22.8 % — ABNORMAL HIGH (ref 11.5–15.5)
WBC: 14.1 10*3/uL — ABNORMAL HIGH (ref 4.0–10.5)
nRBC: 0 % (ref 0.0–0.2)

## 2019-04-07 LAB — GLUCOSE, CAPILLARY
Glucose-Capillary: 75 mg/dL (ref 70–99)
Glucose-Capillary: 71 mg/dL (ref 70–99)
Glucose-Capillary: 152 mg/dL — ABNORMAL HIGH (ref 70–99)
Glucose-Capillary: 91 mg/dL (ref 70–99)
Glucose-Capillary: 83 mg/dL (ref 70–99)

## 2019-04-07 LAB — BASIC METABOLIC PANEL
Anion gap: 11 (ref 5–15)
Anion gap: 10 (ref 5–15)
BUN: 38 mg/dL — ABNORMAL HIGH (ref 6–20)
BUN: 37 mg/dL — ABNORMAL HIGH (ref 6–20)
CO2: 35 mmol/L — ABNORMAL HIGH (ref 22–32)
CO2: 38 mmol/L — ABNORMAL HIGH (ref 22–32)
Calcium: 7.7 mg/dL — ABNORMAL LOW (ref 8.9–10.3)
Calcium: 8 mg/dL — ABNORMAL LOW (ref 8.9–10.3)
Chloride: 82 mmol/L — ABNORMAL LOW (ref 98–111)
Chloride: 82 mmol/L — ABNORMAL LOW (ref 98–111)
Creatinine, Ser: 1.24 mg/dL (ref 0.61–1.24)
Creatinine, Ser: 1.08 mg/dL (ref 0.61–1.24)
GFR calc Af Amer: >60 mL/min (ref >60)
GFR calc Af Amer: >60 mL/min (ref >60)
GFR calc non Af Amer: >60 mL/min (ref >60)
GFR calc non Af Amer: >60 mL/min (ref >60)
Glucose, Bld: 162 mg/dL — ABNORMAL HIGH (ref 70–99)
Glucose, Bld: 128 mg/dL — ABNORMAL HIGH (ref 70–99)
Potassium: 3 mmol/L — ABNORMAL LOW (ref 3.5–5.1)
Potassium: 3.2 mmol/L — ABNORMAL LOW (ref 3.5–5.1)
Sodium: 128 mmol/L — ABNORMAL LOW (ref 135–145)
Sodium: 130 mmol/L — ABNORMAL LOW (ref 135–145)

## 2019-04-07 LAB — MAGNESIUM: Magnesium: 2.1 mg/dL (ref 1.7–2.4)

## 2019-04-07 LAB — COOXEMETRY PANEL
Carboxyhemoglobin: 1.7 % — ABNORMAL HIGH (ref 0.5–1.5)
Methemoglobin: 0.8 % (ref 0.0–1.5)
O2 Saturation: 65.2 %
Total hemoglobin: 9.8 g/dL — ABNORMAL LOW (ref 12.0–16.0)

## 2019-04-07 LAB — PROTIME-INR
INR: 3 — ABNORMAL HIGH (ref 0.8–1.2)
Prothrombin Time: 30.5 seconds — ABNORMAL HIGH (ref 11.4–15.2)

## 2019-04-07 SURGERY — RIGHT HEART CATH
Anesthesia: LOCAL

## 2019-04-07 MED ORDER — LIDOCAINE HCL (PF) 1 % IJ SOLN
INTRAMUSCULAR | Status: AC
  Filled 2019-04-07: qty 30

## 2019-04-07 MED ORDER — LIDOCAINE HCL (PF) 1 % IJ SOLN
INTRAMUSCULAR | Status: DC | PRN
  Administered 2019-04-07 (×2): 2 mL

## 2019-04-07 MED ORDER — HEPARIN (PORCINE) IN NACL 1000-0.9 UT/500ML-% IV SOLN
INTRAVENOUS | Status: DC | PRN
  Administered 2019-04-07: 500 mL

## 2019-04-07 MED ORDER — NITROGLYCERIN 1 MG/10 ML FOR IR/CATH LAB
INTRA_ARTERIAL | Status: AC
  Filled 2019-04-07: qty 10

## 2019-04-07 MED ORDER — METOLAZONE 2.5 MG PO TABS
2.5000 mg | ORAL_TABLET | Freq: Once | ORAL | Status: AC
  Administered 2019-04-07: 2.5 mg via ORAL
  Filled 2019-04-07: qty 1

## 2019-04-07 MED ORDER — FENTANYL CITRATE (PF) 100 MCG/2ML IJ SOLN
INTRAMUSCULAR | Status: DC | PRN
  Administered 2019-04-07: 25 ug via INTRAVENOUS

## 2019-04-07 MED ORDER — NITROGLYCERIN 1 MG/10 ML FOR IR/CATH LAB
INTRA_ARTERIAL | Status: DC | PRN
  Administered 2019-04-07: 200 ug via INTRAVENOUS

## 2019-04-07 MED ORDER — SODIUM CHLORIDE 0.9% FLUSH: 10.0000 mL | INTRAVENOUS | Status: DC | PRN

## 2019-04-07 MED ORDER — POTASSIUM CHLORIDE CRYS ER 20 MEQ PO TBCR
40.0000 meq | EXTENDED_RELEASE_TABLET | Freq: Once | ORAL | Status: AC
  Administered 2019-04-07: 40 meq via ORAL
  Filled 2019-04-07: qty 2

## 2019-04-07 MED ORDER — WARFARIN SODIUM 5 MG PO TABS
5.0000 mg | ORAL_TABLET | Freq: Once | ORAL | Status: AC
  Administered 2019-04-07: 5 mg via ORAL
  Filled 2019-04-07: qty 1

## 2019-04-07 MED ORDER — HEPARIN (PORCINE) IN NACL 1000-0.9 UT/500ML-% IV SOLN
INTRAVENOUS | Status: AC
  Filled 2019-04-07: qty 500

## 2019-04-07 MED ORDER — SODIUM CHLORIDE 0.9% FLUSH: 10.0000 mL | Freq: Two times a day (BID) | INTRAVENOUS | Status: DC

## 2019-04-07 MED ORDER — POTASSIUM CHLORIDE 10 MEQ/100ML IV SOLN
10.0000 meq | INTRAVENOUS | Status: AC
  Administered 2019-04-07 (×3): 10 meq via INTRAVENOUS
  Filled 2019-04-07 (×3): qty 100

## 2019-04-07 MED ORDER — FENTANYL CITRATE (PF) 100 MCG/2ML IJ SOLN
INTRAMUSCULAR | Status: AC
  Filled 2019-04-07: qty 2

## 2019-04-07 SURGICAL SUPPLY — 10 items
BAG SNAP BAND KOVER 36X36 (MISCELLANEOUS) ×2 IMPLANT
CATH BALLN WEDGE 5F 110CM (CATHETERS) ×2 IMPLANT
COVER DOME SNAP 22 D (MISCELLANEOUS) ×2 IMPLANT
GUIDEWIRE .025 260CM (WIRE) ×2 IMPLANT
KIT MICROPUNCTURE NIT STIFF (SHEATH) ×2 IMPLANT
PACK CARDIAC CATHETERIZATION (CUSTOM PROCEDURE TRAY) ×2 IMPLANT
SHEATH RAIN 4/5FR (SHEATH) ×2 IMPLANT
TRANSDUCER W/STOPCOCK (MISCELLANEOUS) ×2 IMPLANT
TUBING ART PRESS 72  MALE/FEM (TUBING) ×1
TUBING ART PRESS 72 MALE/FEM (TUBING) ×1 IMPLANT

## 2019-04-07 NOTE — Progress Notes (Signed)
Physical Therapy Treatment Patient Details Name: Lance Cook MRN: [id number] DOB: 1960/11/27 Today's Date: 04/07/2019    History of Present Illness 58 yo admitted with HF exacerbation. PMHx: CHF, AVR, MVR, CAD, AFib, AA dissection, DM, HTN, PVD, testicular CA, CVA at 58yo    PT Comments    Patient progressing slowly and limited this session due to L UE restrictions on weight bearing.  Demonstrates balance enough to walk short distance without device, but did reach once for footboard as walking around bed so limited to protect L UE as per RN.  PT to follow acutely.   Follow Up Recommendations  Home health PT     Equipment Recommendations  None recommended by PT    Recommendations for Other Services       Precautions / Restrictions Precautions Precautions: Fall    Mobility  Bed Mobility Overal bed mobility: Needs Assistance       Supine to sit: Min guard     General bed mobility comments: use of rail with increased time, assist for balance once sitting and scooting L hip  Transfers   Equipment used: 1 person hand held assist Transfers: Sit to/from Stand Sit to Stand: Min assist         General transfer comment: R HHA due to restricted L UE after cath  Ambulation/Gait Ambulation/Gait assistance: Min guard Gait Distance (Feet): 30 Feet Assistive device: None Gait Pattern/deviations: Step-through pattern;Wide base of support     General Gait Details: wide BOS and increased lateral sway with LE edema and no device, limited to around bed to chair   Stairs             Wheelchair Mobility    Modified Rankin (Stroke Patients Only)       Balance Overall balance assessment: Needs assistance   Sitting balance-Leahy Scale: Good     Standing balance support: Single extremity supported;During functional activity Standing balance-Leahy Scale: Fair Standing balance comment: reaching for foot board as walking around bed, despite cues not to use L UE so  limited ambulation to in room only                            Cognition Arousal/Alertness: Awake/alert Behavior During Therapy: WFL for tasks assessed/performed Overall Cognitive Status: Within Functional Limits for tasks assessed                                        Exercises Other Exercises Other Exercises: sit<>stand x 5 min UE support Other Exercises: seated marching x 10 Other Exercises: AP's x 10    General Comments General comments (skin integrity, edema, etc.): VSS mobilizing in room on O2      Pertinent Vitals/Pain Pain Assessment: Faces Faces Pain Scale: Hurts little more Pain Location: legs and back Pain Descriptors / Indicators: Aching Pain Intervention(s): Monitored during session;Repositioned    Home Living                      Prior Function            PT Goals (current goals can now be found in the care plan section) Progress towards PT goals: Progressing toward goals    Frequency    Min 3X/week      PT Plan Current plan remains appropriate    Co-evaluation  AM-PAC PT "6 Clicks" Mobility   Outcome Measure  Help needed turning from your back to your side while in a flat bed without using bedrails?: A Little Help needed moving from lying on your back to sitting on the side of a flat bed without using bedrails?: A Little Help needed moving to and from a bed to a chair (including a wheelchair)?: A Little Help needed standing up from a chair using your arms (e.g., wheelchair or bedside chair)?: A Little Help needed to walk in hospital room?: A Little Help needed climbing 3-5 steps with a railing? : A Little 6 Click Score: 18    End of Session Equipment Utilized During Treatment: Oxygen Activity Tolerance: Patient tolerated treatment well Patient left: in chair;with call bell/phone within reach   PT Visit Diagnosis: Other abnormalities of gait and mobility (R26.89);Muscle weakness  (generalized) (M62.81)     Time: 2595-6387 PT Time Calculation (min) (ACUTE ONLY): 18 min  Charges:  $Gait Training: 8-22 mins                     Magda Kiel, [place] [phone] 04/07/2019    Reginia Naas 04/07/2019, 1:34 PM

## 2019-04-07 NOTE — Progress Notes (Signed)
[place] for Coumadin Indication: AVR/MVR/Afib  Allergies  Allergen Reactions  . Feraheme [Ferumoxytol] Shortness Of Breath    SOB/Tachycardia  . Penicillins Hives and Rash    Has patient had a PCN reaction causing immediate rash, facial/tongue/throat swelling, SOB or lightheadedness with hypotension: Yes Has patient had a PCN reaction causing severe rash involving mucus membranes or skin necrosis: Yes Has patient had a PCN reaction that required hospitalization unsure Has patient had a PCN reaction occurring within the last 10 years: 2010-2012 If all of the above answers are "NO", then may proceed with Cephalosporin use.  [name] Other (See Comments)    CDIFF, when taking oral tablets   . Tape Other (See Comments) and Itching    Skin irritation    Patient Measurements: Height: 5' (152.4 cm) Weight: 198 lb 13.7 oz (90.2 kg)(Pt weighed in bed w/ CPAP mask on) IBW/kg (Calculated) : 50  Vital Signs: Temp: 97.5 F (36.4 C) (08/06 0400) Temp Source: Oral (08/06 0400) BP: 102/61 (08/06 0905) Pulse Rate: 101 (08/06 0905)  Labs: Recent Labs    04/04/19 1905  04/05/19 0425 04/05/19 1222 04/05/19 2124 04/05/19 2142 04/06/19 0440 04/07/19 0352  HGB  --    < > 9.5* 11.6*  --  11.6* 9.1* 9.5*  HCT  --    < > 32.7* 41.2  --  34.0* 31.4* 33.2*  PLT  --    < > 225 150  --   --  144* 128*  LABPROT  --   --  28.2*  --   --   --  31.9* 30.5*  INR  --   --  2.7*  --   --   --  3.2* 3.0*  HEPARINUNFRC <0.10*  --  <0.10*  --   --   --   --   --   CREATININE  --   --  1.02 1.28* 1.29*  --  1.13 1.24   < > = values in this interval not displayed.    Estimated Creatinine Clearance: 61.5 mL/min (by C-G formula based on SCr of 1.24 mg/dL).   Medical History: Past Medical History:  Diagnosis Date  . Acute on chronic diastolic CHF (congestive heart failure) ([place]) 08/27/2016  . Anemia   . Anxiety   . ARDS (adult  respiratory distress syndrome) ([place]) 01/27/2015  . Arthritis   . Ascending aortic dissection ([place]) 07/14/2008   Localized dissection of ascending aorta noted on CTA in 2009 and stable on CTA in 2011  . Asymptomatic chronic venous hypertension 01/15/2010   Overview:  Overview:  Qualifier: Diagnosis of  By: [name] MD, [name]   Last Assessment & Plan:  I suggested he go to a vein clinic to see if he could be qualified compression hose of some type, since he is not a surgical candidate according to the vascular surgeons.   . Atrial fibrillation ([place])    chronic persistent  . Bell's palsy   . C. difficile diarrhea   . CAD (coronary artery disease)    Old scar inferior wall myoview, 10/2009 EF 52%.  He did have previous SVG to RCA but no obstructive disease noted on his most recent cath.  SVG occluded.   . Cellulitis 04/02/2014  . Cerebral artery occlusion with cerebral infarction ([place]) 10/08/2011   Overview:  Overview:  And hx of TIA prior to CABG, all thought due to systemic emboli prior to coumadin   . Chronic diastolic heart failure ([place])  03/01/2015   a. 12/2015: echo showing a preserved EF of 65-70%, moderate AS, and moderate TR.   Marland Kitchen Chronic LBP 10/08/2011  . CVA (cerebral vascular accident) ([place]) 10/08/2011   And hx of TIA prior to CABG, all thought due to systemic emboli prior to coumadin   . DIABETES MELLITUS, TYPE II 11/01/2009   Qualifier: Diagnosis of  By: [name] MD, [name]    . Diverticulosis   . 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 Encephalopathy acute 02/05/2015  . Gastric polyps   . GERD (gastroesophageal reflux disease)    not needing medication at thhis time- 01/22/15  . Gout   . H/O mechanical aortic valve replacement 11/01/2009   Qualifier: Diagnosis of  By: [name], [name]    . Hiatal hernia   . History of colon polyps 12/23/2011   Overview:   Overview:  Colonoscopy January 2010, 6 mm rectal tubulovillous adenoma. No high-grade dysplasia   . HTN (hypertension) 02/23/2014  . Hyperlipidemia   . Hypertension   . Hypogonadism male 04/08/2012  . Impaired glucose tolerance 10/08/2011  . Morbid obesity ([place]) 04/02/2014  . Myocardial infarction [facility]) age 42  . OSA (obstructive sleep apnea)    CPAP  . Peripheral vascular disease ([place])   . 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 10/11/4707   33 mm St Jude bileaflet mechanical valve placed via right mini thoracotomy approach  . 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    1988 - Dr Blase Mess at [facility] in [employer], [place]  . Severe mitral regurgitation 10/11/2014  . Testicular cancer [facility])    He was 58 y/o. He had surgical resection and rad tx's.   Marland Kitchen TIA (transient ischemic attack)    age 63  . Tubulovillous adenoma of colon   . Varicose veins   . 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 Venous (peripheral) insufficiency 02/03/2005    Medications:  No current facility-administered medications on file prior to encounter.    Current Outpatient Medications on File Prior to Encounter  Medication Sig Dispense Refill  . allopurinol (ZYLOPRIM) 300 MG tablet Take 300 mg by mouth daily.    Marland Kitchen aspirin EC 81 MG EC tablet Take 1 tablet (81 mg total) by mouth daily. 30 tablet 0  . bumetanide (BUMEX) 2 MG tablet Take 1 tablet (2 mg total) by mouth 2 (two) times daily. (Patient taking differently: Take 4 mg by mouth 2 (two) times daily. ) 60 tablet 0  . gabapentin (NEURONTIN) 300 MG capsule Take 300 mg by mouth 3 (three) times daily.    . magnesium oxide (MAG-OX) 400 (241.3 MG) MG tablet Take 1 tablet (400 mg total) by mouth 2 (two) times daily. (Patient taking differently: Take 400 mg by mouth 3 (three) times daily. ) 180 tablet 3  . methocarbamol (ROBAXIN) 500 MG tablet Take 1 tablet (500 mg total) by mouth 3  (three) times daily. 270 tablet 0  . metolazone (ZAROXOLYN) 5 MG tablet Take 10 mg by mouth every morning.    . metoprolol succinate (TOPROL-XL) 25 MG 24 hr tablet Take 1.5 tablets (37.5 mg total) by mouth 2 (two) times daily. 90 tablet 0  . mupirocin ointment (BACTROBAN) 2 % Apply 1 application topically 3 (three) times daily.     Marland Kitchen oxyCODONE (OXY IR/ROXICODONE)  5 MG immediate release tablet Take 1 tablet (5 mg total) by mouth every 8 (eight) hours as needed for severe pain. (Patient taking differently: Take 5 mg by mouth 3 (three) times daily. ) 90 tablet 0  . potassium chloride SA (K-DUR) 20 MEQ tablet Take 2 tablets (40 mEq total) by mouth 3 (three) times daily. 180 tablet 0  . PRESCRIPTION MEDICATION See admin instructions. CPAP- At bedtime and during any time of rest    . silver sulfADIAZINE (SILVADENE) 1 % cream Apply 1 application topically daily as needed (for wound care- both legs).     . traZODone (DESYREL) 100 MG tablet Take 100 mg by mouth at bedtime.     Marland Kitchen warfarin (COUMADIN) 5 MG tablet 5 mg daily, and 2.5 mg on Mondays (Patient taking differently: Take 5 mg by mouth. ) 135 tablet 0    Assessment: 58 y.o. male admitted with CHF exacerbation, h/o mechanical AVR, MVR, Afib, to continue warfarin.  INR down to 3 today. Hgb 9.5, plt 128. Warfarin dose held last night for RHC today. No s/sx of bleeding.   Patient has been been taking Coumadin 5mg  daily prior to admission.   Goal of Therapy:  Heparin level 0.3-0.7 units/mL INR 2.5-3.5 Monitor platelets by anticoagulation protocol: Yes   Plan:  Restart warfarin 5 mg tonight  Daily INR.  Antonietta Jewel, PharmD, [place] Clinical Pharmacist  Pager: [phone] Phone: [phone]  04/07/2019 11:04 AM

## 2019-04-07 NOTE — Progress Notes (Signed)
Paged Cardiology fellow regarding pt's AM labs with K 3.0. Hassell Done MD gave verbal order to give 0800 dose of 40 mEq PO potassium early. Martin MD also entered orders for 4 runs of IV K.   Nelva Bush RN

## 2019-04-07 NOTE — Progress Notes (Signed)
RT placed pt on CPAP dream station on auto titrate with settings of 20 max 4 min w/2 LPM bled into system. Pt respiratory status is stable at this time on CPAP. RT will continue to monitor.

## 2019-04-07 NOTE — Progress Notes (Signed)
Patient ID: Lance Cook, male   DOB: July 21, 1961, 58 y.o.   MRN: [id number]     Advanced Heart Failure Rounding Note  PCP-Cardiologist: [name], MD   Subjective:    TEE showed markedly dilated LA, LV with EF 50%, septal-lateral dyssynchrony.  Normal RV systolic size/function.  Mechanical MV with mean gradient 5 and mild peri-valvular regurgitation. Mechanical AoV with mean gradient 23, appeared to open well.   On 8/4, he had a severe reaction after starting Feraheme. Required 120 mg solumedrol and benadryl. Placed on Bipap, amio drip for rate control, and Neosynephrine for hypotension.  WBCs elevated but afebrile.   He was titrated off phenylephrine overnight, now on midodrine.  He diuresed very well yesterday, weight down about 5 lbs.  Breathing better.  Still has back pain.   RHC today:  RHC Procedural Findings: Hemodynamics (mmHg) RA mean 11 RV 46/6 PA 50/21, mean 33 PCWP mean 19 Oxygen saturations: PA 69% AO 100% Cardiac Output (Fick) 6.24  Cardiac Index (Fick) 3.32 PVR 2.2 WU PAPI 2.63 CVP/PCWP 0.59  Objective:   Weight Range: 90.2 kg Body mass index is 38.84 kg/m.   Vital Signs:   Temp:  [97.5 F (36.4 C)-98.2 F (36.8 C)] 97.5 F (36.4 C) (08/06 0400) Pulse Rate:  [58-117] 110 (08/06 0824) Resp:  [14-34] 14 (08/06 0824) BP: (82-128)/(53-108) 116/69 (08/06 0824) SpO2:  [91 %-100 %] 100 % (08/06 0824) Weight:  [90.2 kg] 90.2 kg (08/06 0558) Last BM Date: 04/05/19  Weight change: Filed Weights   04/05/19 0100 04/06/19 0500 04/07/19 0558  Weight: 92.4 kg 92.4 kg 90.2 kg    Intake/Output:   Intake/Output Summary (Last 24 hours) at 04/07/2019 0833 Last data filed at 04/07/2019 0700 Gross per 24 hour  Intake 1296.18 ml  Output 6250 ml  Net -4953.82 ml      Physical Exam   General: NAD Neck: JVP 10 cm, no thyromegaly or thyroid nodule.  Lungs: Decreased BS at bases.  CV: Nondisplaced PMI.  Heart irregular S1/S2 with mechanical S1S2, no S3/S4, 2/6  HSM LLSB.  1+ edema to knees.   Abdomen: Soft, nontender, no hepatosplenomegaly, no distention.  Skin: Intact without lesions or rashes.  Neurologic: Alert and oriented x 3.  Psych: Normal affect. Extremities: No clubbing or cyanosis.  HEENT: Normal.    Telemetry   A fib 70-80s (personally reviewed)  Labs    CBC Recent Labs    04/06/19 0440 04/07/19 0352  WBC 23.3* 14.1*  NEUTROABS 22.0* 12.7*  HGB 9.1* 9.5*  HCT 31.4* 33.2*  MCV 74.8* 74.3*  PLT 144* 440*   Basic Metabolic Panel Recent Labs    04/06/19 0440 04/07/19 0352  NA 131* 128*  K 3.8 3.0*  CL 84* 82*  CO2 35* 35*  GLUCOSE 217* 162*  BUN 32* 38*  CREATININE 1.13 1.24  CALCIUM 7.3* 7.7*  MG 1.9 2.1   Liver Function Tests Recent Labs    04/05/19 1222  AST 94*  ALT 30  ALKPHOS 87  BILITOT 1.3*  PROT 4.8*  ALBUMIN 1.9*   No results for input(s): LIPASE, AMYLASE in the last 72 hours. Cardiac Enzymes No results for input(s): CKTOTAL, CKMB, CKMBINDEX, TROPONINI in the last 72 hours.  BNP: BNP (last 3 results) Recent Labs    02/13/19 1504 04/01/19 2157  BNP 223.8* 302.1*    ProBNP (last 3 results) No results for input(s): PROBNP in the last 8760 hours.   D-Dimer No results for input(s): DDIMER in the last  72 hours. Hemoglobin A1C No results for input(s): HGBA1C in the last 72 hours. Fasting Lipid Panel No results for input(s): CHOL, HDL, LDLCALC, TRIG, CHOLHDL, LDLDIRECT in the last 72 hours. Thyroid Function Tests No results for input(s): TSH, T4TOTAL, T3FREE, THYROIDAB in the last 72 hours.  Invalid input(s): FREET3  Other results:   Imaging    No results found.   Medications:     Scheduled Medications: . [MAR Hold] allopurinol  300 mg Oral Daily  . [MAR Hold] aspirin EC  81 mg Oral Daily  . [MAR Hold] Chlorhexidine Gluconate Cloth  6 each Topical Daily  . [MAR Hold] doxycycline  100 mg Oral Q12H  . [MAR Hold] ferrous sulfate  325 mg Oral TID WC  . [MAR Hold]  gabapentin  200 mg Oral TID  . [MAR Hold] insulin aspart  0-15 Units Subcutaneous TID WC  . [MAR Hold] insulin aspart  0-5 Units Subcutaneous QHS  . [MAR Hold] magnesium oxide  400 mg Oral BID  . [MAR Hold] methocarbamol  500 mg Oral Q8H  . metolazone  2.5 mg Oral Once  . [MAR Hold] midodrine  5 mg Oral TID WC  . [MAR Hold] potassium chloride  40 mEq Oral QID  . [MAR Hold] sodium chloride flush  10-40 mL Intracatheter Q12H  . [MAR Hold] sodium chloride flush  3 mL Intravenous Q12H  . [MAR Hold] sodium chloride flush  3 mL Intravenous Q12H  . [MAR Hold] spironolactone  25 mg Oral Daily  . [MAR Hold] traZODone  100 mg Oral QHS  . [MAR Hold] Warfarin - Pharmacist Dosing Inpatient   Does not apply q1800    Infusions: . [MAR Hold] sodium chloride    . sodium chloride    . sodium chloride Stopped (04/07/19 0654)  . furosemide (LASIX) infusion 15 mg/hr (04/07/19 0700)  . [MAR Hold] phenylephrine (NEO-SYNEPHRINE) Adult infusion Stopped (04/06/19 1531)  . [MAR Hold] potassium chloride 100 mL/hr at 04/07/19 0700    PRN Medications: [MAR Hold] sodium chloride, sodium chloride, [MAR Hold] acetaminophen, [MAR Hold] mupirocin ointment, [MAR Hold] ondansetron (ZOFRAN) IV, [MAR Hold] oxyCODONE, [MAR Hold] silver sulfADIAZINE, [MAR Hold] sodium chloride flush, [MAR Hold] sodium chloride flush, sodium chloride flush  Assessment/Plan   1. Acute on chronic diastolic CHF:  Last echo in 6/20 showed preserved EF 55-60% with septal bounce from LBBB. Mean gradient was elevated across aortic valve at 30 mmHg (though not markedly elevated), and there was suggestion of mild peri-valvular regurgitation at mitral position.  On TEE this admission, EF 50% with septal-lateral dyssynchrony (c/w LBBB), normal-appearing RV, mechanical MV with mild peri-valvular regurgitation and mean gradient 5, mechanical AoV with mean gradient 23 mmHg.  He has significantly decompensated over the last several months, now with marked  diuretic resistance. His atrial fibrillation is not new.  Dysfunction of his mechanical valves does not appear to explain his worsening CHF. 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  RHC was done today, showed mildly elevated filling pressures after another good diuresis yesterday, RV function appears adequate.  - Continue Lasix gtt 15 mg/hr and will give metolazone 2.5 x 1 today.  Hopefully can transition to po tomorrow.    - Continue spironolactone and K replacement.  Repeat BMET in afternoon.  - He is on midodrine currently, hopefully can titrate off.   - Unna boots.  2. Valvular heart disease: Patient has mechanical mitral and aortic valves.  TEE showed mechanical MV with mean gradient 5 mmHg, mild  peri-valvular regurgitation.  The aortic valve appeared to open reasonably well but mean gradient was 23 mmHg, ?component of mild patient-prosthesis mismatch.  No indication for any sort of intervention.  - Continue ASA 81 and warfarin with INR goal 2.5-3.5. INR 3.2 . 3. Atrial fibrillation: Chronic.  Long-standing atrial fibrillation with severely dilated atria, unlikely to have a successful cardioversion. Rate is reasonably controlled now off amiodarone.  4. CAD: Last cath in 4/16 with patent native coronaries, probably occluded SVG-RCA. Doubt CAD as cause of his decompensation.  5. Venous stasis ulceration: Noted on lower legs. He was started on clindamycin initially for ?of co-existing cellulitis.  Given history of recurrent C difficile he was switched to doxycycline.   6. Type 2 diabetes: SSI.  7. OSA: CPAP.  8. Hypokalemia: Replete K and repeat BMET this afternoon.   9. Hyponatremia: Fluid restrict.   10. Anemia: received feraheme 02/22/2019 and again on 04/05/19 but had severe reaction to feraheme. Given 120 mg solumedrol, benadryl, and placed on Bipap. Now resolved.  - Hgb stable.   11. ID: WBC trending up 11>18>23>14. Blood CX negative, afebrile.  - Got high dose  Solumedrol 8/4, likely cause of high WBCs.  12. Deconditioning: PT/OT follow up.   Length of Stay: 6  Loralie Champagne, MD  04/07/2019, 8:33 AM  Advanced Heart Failure Team Pager [phone] (M-F; 7a - 4p)  Please contact [facility] for night-coverage after hours (4p -7a ) and weekends on [email or web address]

## 2019-04-08 LAB — CBC
HCT: 35.8 % — ABNORMAL LOW (ref 39.0–52.0)
Hemoglobin: 10.3 g/dL — ABNORMAL LOW (ref 13.0–17.0)
MCH: 21.6 pg — ABNORMAL LOW (ref 26.0–34.0)
MCHC: 28.8 g/dL — ABNORMAL LOW (ref 30.0–36.0)
MCV: 75.1 fL — ABNORMAL LOW (ref 80.0–100.0)
Platelets: 157 10*3/uL (ref 150–400)
RBC: 4.77 MIL/uL (ref 4.22–5.81)
RDW: 23 % — ABNORMAL HIGH (ref 11.5–15.5)
WBC: 11.2 10*3/uL — ABNORMAL HIGH (ref 4.0–10.5)
nRBC: 0 % (ref 0.0–0.2)

## 2019-04-08 LAB — COOXEMETRY PANEL
Carboxyhemoglobin: 1.9 % — ABNORMAL HIGH (ref 0.5–1.5)
Methemoglobin: 0.8 % (ref 0.0–1.5)
O2 Saturation: 64.3 %
Total hemoglobin: 10.7 g/dL — ABNORMAL LOW (ref 12.0–16.0)

## 2019-04-08 LAB — BASIC METABOLIC PANEL
Anion gap: 10 (ref 5–15)
Anion gap: 11 (ref 5–15)
BUN: 35 mg/dL — ABNORMAL HIGH (ref 6–20)
BUN: 31 mg/dL — ABNORMAL HIGH (ref 6–20)
CO2: 39 mmol/L — ABNORMAL HIGH (ref 22–32)
CO2: 38 mmol/L — ABNORMAL HIGH (ref 22–32)
Calcium: 8.3 mg/dL — ABNORMAL LOW (ref 8.9–10.3)
Calcium: 8.4 mg/dL — ABNORMAL LOW (ref 8.9–10.3)
Chloride: 79 mmol/L — ABNORMAL LOW (ref 98–111)
Chloride: 82 mmol/L — ABNORMAL LOW (ref 98–111)
Creatinine, Ser: 1.04 mg/dL (ref 0.61–1.24)
Creatinine, Ser: 0.91 mg/dL (ref 0.61–1.24)
GFR calc Af Amer: >60 mL/min (ref >60)
GFR calc Af Amer: >60 mL/min (ref >60)
GFR calc non Af Amer: >60 mL/min (ref >60)
GFR calc non Af Amer: >60 mL/min (ref >60)
Glucose, Bld: 183 mg/dL — ABNORMAL HIGH (ref 70–99)
Glucose, Bld: 99 mg/dL (ref 70–99)
Potassium: 3.1 mmol/L — ABNORMAL LOW (ref 3.5–5.1)
Potassium: 3.8 mmol/L (ref 3.5–5.1)
Sodium: 128 mmol/L — ABNORMAL LOW (ref 135–145)
Sodium: 131 mmol/L — ABNORMAL LOW (ref 135–145)

## 2019-04-08 LAB — MAGNESIUM: Magnesium: 1.9 mg/dL (ref 1.7–2.4)

## 2019-04-08 LAB — GLUCOSE, CAPILLARY
Glucose-Capillary: 70 mg/dL (ref 70–99)
Glucose-Capillary: 81 mg/dL (ref 70–99)
Glucose-Capillary: 92 mg/dL (ref 70–99)
Glucose-Capillary: 78 mg/dL (ref 70–99)

## 2019-04-08 LAB — PROTIME-INR
INR: 2.3 — ABNORMAL HIGH (ref 0.8–1.2)
Prothrombin Time: 25.3 seconds — ABNORMAL HIGH (ref 11.4–15.2)

## 2019-04-08 MED ORDER — ACETAZOLAMIDE 250 MG PO TABS
500.0000 mg | ORAL_TABLET | Freq: Two times a day (BID) | ORAL | Status: AC
  Administered 2019-04-08 (×2): 500 mg via ORAL
  Filled 2019-04-08 (×2): qty 2

## 2019-04-08 MED ORDER — MAGNESIUM SULFATE 2 GM/50ML IV SOLN
2.0000 g | Freq: Once | INTRAVENOUS | Status: AC
  Administered 2019-04-08: 2 g via INTRAVENOUS
  Filled 2019-04-08: qty 50

## 2019-04-08 MED ORDER — POTASSIUM CHLORIDE CRYS ER 20 MEQ PO TBCR
60.0000 meq | EXTENDED_RELEASE_TABLET | Freq: Four times a day (QID) | ORAL | Status: DC
  Administered 2019-04-08 – 2019-04-10 (×11): 60 meq via ORAL
  Filled 2019-04-08 (×11): qty 3

## 2019-04-08 MED ORDER — POTASSIUM CHLORIDE CRYS ER 20 MEQ PO TBCR
30.0000 meq | EXTENDED_RELEASE_TABLET | Freq: Once | ORAL | Status: AC
  Administered 2019-04-08: 30 meq via ORAL
  Filled 2019-04-08: qty 1

## 2019-04-08 MED ORDER — WARFARIN SODIUM 5 MG PO TABS
10.0000 mg | ORAL_TABLET | Freq: Once | ORAL | Status: AC
  Administered 2019-04-08: 17:00:00 10 mg via ORAL
  Filled 2019-04-08: qty 2

## 2019-04-08 NOTE — Progress Notes (Signed)
CVP rechecked with MD at the bedside due to significant increase from last reading. CVP 17-20. MD agrees that this reading was taken correctly and reading is accurate.

## 2019-04-08 NOTE — Progress Notes (Signed)
Pt requested removal of unna boots d/t discomfort and itching. RN stated to pt that unna boots are scheduled to be changed in the next day or two, but pt still insisted that they be taken off. Unna boots removed by [employer]. Pt's legs were cleansed, dried, coated w/ thin layer of silvadene cream, and wrapped in kerlix gauze. Pt reports relief of itching and states that he is more comfortable now.   Will relay to day shift RN so that HF team can evaluate the need for re-application of unna boots.   Nelva Bush RN

## 2019-04-08 NOTE — Progress Notes (Signed)
RT placed pt on CPAP dream station in auto titrate with no O2 bled into the system. Pt respiratory status stable on CPAP spo2 100%. RT will continue to monitor.

## 2019-04-08 NOTE — Progress Notes (Signed)
Patient ID: Lance Cook, male   DOB: Sep 30, 1960, 58 y.o.   MRN: [id number]     Advanced Heart Failure Rounding Note  PCP-Cardiologist: [name], MD   Subjective:    TEE showed markedly dilated LA, LV with EF 50%, septal-lateral dyssynchrony.  Normal RV systolic size/function.  Mechanical MV with mean gradient 5 and mild peri-valvular regurgitation. Mechanical AoV with mean gradient 23, appeared to open well.   On 8/4, he had a severe reaction after starting Feraheme. Required 120 mg solumedrol and benadryl. Placed on Bipap, amio drip for rate control, and Neosynephrine for hypotension.  WBCs elevated but afebrile. This has resolved, off phenylephrine but still on midodrine.   He diuresed very well again last night, BUN/creatinine mildly lower.  Weight now down around 35 lbs. CVP this morning is still 17-18 (surprising despite yesterday's diuresis but rechecked). K is low.   RHC 8/6:  RHC Procedural Findings: Hemodynamics (mmHg) RA mean 11 RV 46/6 PA 50/21, mean 33 PCWP mean 19 Oxygen saturations: PA 69% AO 100% Cardiac Output (Fick) 6.24  Cardiac Index (Fick) 3.32 PVR 2.2 WU PAPI 2.63 CVP/PCWP 0.59  Objective:   Weight Range: 82.9 kg Body mass index is 35.69 kg/m.   Vital Signs:   Temp:  [97.7 F (36.5 C)-98.4 F (36.9 C)] 98.4 F (36.9 C) (08/07 0743) Pulse Rate:  [90-111] 96 (08/07 0700) Resp:  [14-26] 21 (08/07 0700) BP: (90-116)/(50-75) 106/59 (08/07 0700) SpO2:  [92 %-100 %] 98 % (08/07 0700) Weight:  [82.9 kg] 82.9 kg (08/07 0355) Last BM Date: 04/07/19  Weight change: Filed Weights   04/06/19 0500 04/07/19 0558 04/08/19 0355  Weight: 92.4 kg 90.2 kg 82.9 kg    Intake/Output:   Intake/Output Summary (Last 24 hours) at 04/08/2019 0819 Last data filed at 04/08/2019 0700 Gross per 24 hour  Intake 1808.14 ml  Output 9350 ml  Net -7541.86 ml      Physical Exam   General: NAD Neck: JVP 14, no thyromegaly or thyroid nodule.  Lungs: Clear to  auscultation bilaterally with normal respiratory effort. CV: Nondisplaced PMI.  Heart irregular S1/S2, mechanical S1S2, no S3/S4, 2/6 HSM LLSB.  1+ edema 1/2 to knees.   Abdomen: Soft, nontender, no hepatosplenomegaly, no distention.  Skin: Intact without lesions or rashes.  Neurologic: Alert and oriented x 3.  Psych: Normal affect. Extremities: No clubbing or cyanosis.  HEENT: Normal.    Telemetry   A fib 70-80s (personally reviewed)  Labs    CBC Recent Labs    04/06/19 0440 04/07/19 0352 04/07/19 0821 04/08/19 0411  WBC 23.3* 14.1*  --  11.2*  NEUTROABS 22.0* 12.7*  --   --   HGB 9.1* 9.5* 11.2*  10.9* 10.3*  HCT 31.4* 33.2* 33.0*  32.0* 35.8*  MCV 74.8* 74.3*  --  75.1*  PLT 144* 128*  --  366   Basic Metabolic Panel Recent Labs    04/07/19 0352  04/07/19 1345 04/08/19 0411  NA 128*   < > 130* 128*  K 3.0*   < > 3.2* 3.1*  CL 82*  --  82* 79*  CO2 35*  --  38* 39*  GLUCOSE 162*  --  128* 183*  BUN 38*  --  37* 35*  CREATININE 1.24  --  1.08 1.04  CALCIUM 7.7*  --  8.0* 8.3*  MG 2.1  --   --  1.9   < > = values in this interval not displayed.   Liver Function Tests Recent  Labs    04/05/19 1222  AST 94*  ALT 30  ALKPHOS 87  BILITOT 1.3*  PROT 4.8*  ALBUMIN 1.9*   No results for input(s): LIPASE, AMYLASE in the last 72 hours. Cardiac Enzymes No results for input(s): CKTOTAL, CKMB, CKMBINDEX, TROPONINI in the last 72 hours.  BNP: BNP (last 3 results) Recent Labs    02/13/19 1504 04/01/19 2157  BNP 223.8* 302.1*    ProBNP (last 3 results) No results for input(s): PROBNP in the last 8760 hours.   D-Dimer No results for input(s): DDIMER in the last 72 hours. Hemoglobin A1C No results for input(s): HGBA1C in the last 72 hours. Fasting Lipid Panel No results for input(s): CHOL, HDL, LDLCALC, TRIG, CHOLHDL, LDLDIRECT in the last 72 hours. Thyroid Function Tests No results for input(s): TSH, T4TOTAL, T3FREE, THYROIDAB in the last 72 hours.   Invalid input(s): FREET3  Other results:   Imaging    No results found.   Medications:     Scheduled Medications: . allopurinol  300 mg Oral Daily  . aspirin EC  81 mg Oral Daily  . Chlorhexidine Gluconate Cloth  6 each Topical Daily  . doxycycline  100 mg Oral Q12H  . ferrous sulfate  325 mg Oral TID WC  . gabapentin  200 mg Oral TID  . insulin aspart  0-15 Units Subcutaneous TID WC  . insulin aspart  0-5 Units Subcutaneous QHS  . magnesium oxide  400 mg Oral BID  . methocarbamol  500 mg Oral Q8H  . midodrine  5 mg Oral TID WC  . potassium chloride  40 mEq Oral QID  . spironolactone  25 mg Oral Daily  . traZODone  100 mg Oral QHS  . Warfarin - Pharmacist Dosing Inpatient   Does not apply q1800    Infusions: . furosemide (LASIX) infusion 15 mg/hr (04/08/19 0700)  . magnesium sulfate bolus IVPB    . phenylephrine (NEO-SYNEPHRINE) Adult infusion Stopped (04/06/19 1531)    PRN Medications: acetaminophen, mupirocin ointment, ondansetron (ZOFRAN) IV, oxyCODONE, silver sulfADIAZINE, sodium chloride flush  Assessment/Plan   1. Acute on chronic diastolic CHF:  Last echo in 6/20 showed preserved EF 55-60% with septal bounce from LBBB. Mean gradient was elevated across aortic valve at 30 mmHg (though not markedly elevated), and there was suggestion of mild peri-valvular regurgitation at mitral position.  On TEE this admission, EF 50% with septal-lateral dyssynchrony (c/w LBBB), normal-appearing RV, mechanical MV with mild peri-valvular regurgitation and mean gradient 5, mechanical AoV with mean gradient 23 mmHg.  He has significantly decompensated over the last several months, now with marked diuretic resistance. His atrial fibrillation is not new.  Dysfunction of his mechanical valves does not appear to explain his worsening CHF. 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  [place] 8/6 showed mildly elevated filling pressures.  He diuresed markedly yesterday.  Surprisingly, CVP remains 17-18 (checked twice).  As renal function is stable, will continue diuresis.   - Continue Lasix gtt 15 mg/hr and will give Diamox today rather than metolazone to try to keep K up.     - Continue spironolactone and K replacement.  Repeat BMET in afternoon.  - He is on midodrine currently to maintain BP.  - Unna boots.  - Hopefully to po diuretics over the weekend.  Will need very close CHF clinic followup.  2. Valvular heart disease: Patient has mechanical mitral and aortic valves.  TEE showed mechanical MV with mean gradient 5 mmHg, mild peri-valvular regurgitation.  The aortic valve appeared to open reasonably well but mean gradient was 23 mmHg, ?component of mild patient-prosthesis mismatch.  No indication for any sort of intervention.  - Continue ASA 81 and warfarin with INR goal 2.5-3.5. INR 3.2 . 3. Atrial fibrillation: Chronic.  Long-standing atrial fibrillation with severely dilated atria, unlikely to have a successful cardioversion. Rate is reasonably controlled now off amiodarone.  4. CAD: Last cath in 4/16 with patent native coronaries, probably occluded SVG-RCA. Doubt CAD as cause of his decompensation.  5. Venous stasis ulceration: Noted on lower legs. He was started on clindamycin initially for ?of co-existing cellulitis.  Given history of recurrent C difficile he was switched to doxycycline.   - Complete doxycycline course today.  6. Type 2 diabetes: SSI.  7. OSA: CPAP.  8. Hypokalemia: Replete K and repeat BMET this afternoon.   9. Hyponatremia: Fluid restrict, 1500 cc.  If Na falls further, can dose with tolvaptan.   10. Anemia: received feraheme 02/22/2019 and again on 04/05/19 but had severe reaction to feraheme. Given 120 mg solumedrol, benadryl, and placed on Bipap. Now resolved.  - Hgb stable.   11. ID: WBC trending up 11>18>23>14>11.2. Blood CX negative, afebrile.  - Got high dose Solumedrol 8/4, likely cause of high WBCs.  12. Deconditioning: PT/OT  follow up. Needs to get out of bed.   Length of Stay: 7  Loralie Champagne, MD  04/08/2019, 8:19 AM  Advanced Heart Failure Team Pager [phone] (M-F; 7a - 4p)  Please contact [facility] for night-coverage after hours (4p -7a ) and weekends on [email or web address]

## 2019-04-08 NOTE — Progress Notes (Signed)
[place] for Coumadin Indication: AVR/MVR/Afib  Allergies  Allergen Reactions  . Feraheme [Ferumoxytol] Shortness Of Breath    SOB/Tachycardia  . Penicillins Hives and Rash    Has patient had a PCN reaction causing immediate rash, facial/tongue/throat swelling, SOB or lightheadedness with hypotension: Yes Has patient had a PCN reaction causing severe rash involving mucus membranes or skin necrosis: Yes Has patient had a PCN reaction that required hospitalization unsure Has patient had a PCN reaction occurring within the last 10 years: 2010-2012 If all of the above answers are "NO", then may proceed with Cephalosporin use.  [name] Other (See Comments)    CDIFF, when taking oral tablets   . Tape Other (See Comments) and Itching    Skin irritation    Patient Measurements: Height: 5' (152.4 cm) Weight: 182 lb 12.2 oz (82.9 kg)(weighed in bed w/o CPAP mask) IBW/kg (Calculated) : 50  Vital Signs: Temp: 97.9 F (36.6 C) (08/07 0400) Temp Source: Oral (08/07 0400) BP: 106/59 (08/07 0700) Pulse Rate: 96 (08/07 0700)  Labs: Recent Labs    04/06/19 0440 04/07/19 0352 04/07/19 0821 04/07/19 1345 04/08/19 0411  HGB 9.1* 9.5* 11.2*  10.9*  --  10.3*  HCT 31.4* 33.2* 33.0*  32.0*  --  35.8*  PLT 144* 128*  --   --  157  LABPROT 31.9* 30.5*  --   --  25.3*  INR 3.2* 3.0*  --   --  2.3*  CREATININE 1.13 1.24  --  1.08 1.04    Estimated Creatinine Clearance: 70.1 mL/min (by C-G formula based on SCr of 1.04 mg/dL).   Medical History: Past Medical History:  Diagnosis Date  . Acute on chronic diastolic CHF (congestive heart failure) ([place]) 08/27/2016  . Anemia   . Anxiety   . ARDS (adult respiratory distress syndrome) ([place]) 01/27/2015  . Arthritis   . Ascending aortic dissection ([place]) 07/14/2008   Localized dissection of ascending aorta noted on CTA in 2009 and stable on CTA in 2011  . Asymptomatic chronic  venous hypertension 01/15/2010   Overview:  Overview:  Qualifier: Diagnosis of  By: [name] MD, [name]   Last Assessment & Plan:  I suggested he go to a vein clinic to see if he could be qualified compression hose of some type, since he is not a surgical candidate according to the vascular surgeons.   . Atrial fibrillation ([place])    chronic persistent  . Bell's palsy   . C. difficile diarrhea   . CAD (coronary artery disease)    Old scar inferior wall myoview, 10/2009 EF 52%.  He did have previous SVG to RCA but no obstructive disease noted on his most recent cath.  SVG occluded.   . Cellulitis 04/02/2014  . Cerebral artery occlusion with cerebral infarction ([place]) 10/08/2011   Overview:  Overview:  And hx of TIA prior to CABG, all thought due to systemic emboli prior to coumadin   . Chronic diastolic heart failure ([place]) 03/01/2015   a. 12/2015: echo showing a preserved EF of 65-70%, moderate AS, and moderate TR.   Marland Kitchen Chronic LBP 10/08/2011  . CVA (cerebral vascular accident) ([place]) 10/08/2011   And hx of TIA prior to CABG, all thought due to systemic emboli prior to coumadin   . DIABETES MELLITUS, TYPE II 11/01/2009   Qualifier: Diagnosis of  By: [name] MD, [name]    . Diverticulosis   . 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 Encephalopathy acute 02/05/2015  . Gastric polyps   . GERD (gastroesophageal reflux disease)    not needing medication at thhis time- 01/22/15  . Gout   . H/O mechanical aortic valve replacement 11/01/2009   Qualifier: Diagnosis of  By: [name], [name]    . Hiatal hernia   . History of colon polyps 12/23/2011   Overview:  Overview:  Colonoscopy January 2010, 6 mm rectal tubulovillous adenoma. No high-grade dysplasia   . HTN (hypertension) 02/23/2014  . Hyperlipidemia   . Hypertension   . Hypogonadism male 04/08/2012  . Impaired glucose  tolerance 10/08/2011  . Morbid obesity ([place]) 04/02/2014  . Myocardial infarction [facility]) age 76  . OSA (obstructive sleep apnea)    CPAP  . Peripheral vascular disease ([place])   . 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 9/50/9326   33 mm St Jude bileaflet mechanical valve placed via right mini thoracotomy approach  . 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    1988 - Dr Blase Mess at [facility] in [place], [place]  . Severe mitral regurgitation 10/11/2014  . Testicular cancer [facility])    He was 58 y/o. He had surgical resection and rad tx's.   Marland Kitchen TIA (transient ischemic attack)    age 57  . Tubulovillous adenoma of colon   . Varicose veins   . 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 Venous (peripheral) insufficiency 02/03/2005    Medications:  No current facility-administered medications on file prior to encounter.    Current Outpatient Medications on File Prior to Encounter  Medication Sig Dispense Refill  . allopurinol (ZYLOPRIM) 300 MG tablet Take 300 mg by mouth daily.    Marland Kitchen aspirin EC 81 MG EC tablet Take 1 tablet (81 mg total) by mouth daily. 30 tablet 0  . bumetanide (BUMEX) 2 MG tablet Take 1 tablet (2 mg total) by mouth 2 (two) times daily. (Patient taking differently: Take 4 mg by mouth 2 (two) times daily. ) 60 tablet 0  . gabapentin (NEURONTIN) 300 MG capsule Take 300 mg by mouth 3 (three) times daily.    . magnesium oxide (MAG-OX) 400 (241.3 MG) MG tablet Take 1 tablet (400 mg total) by mouth 2 (two) times daily. (Patient taking differently: Take 400 mg by mouth 3 (three) times daily. ) 180 tablet 3  . methocarbamol (ROBAXIN) 500 MG tablet Take 1 tablet (500 mg total) by mouth 3 (three) times daily. 270 tablet 0  . metolazone (ZAROXOLYN) 5 MG tablet Take 10 mg by mouth every morning.    . metoprolol succinate (TOPROL-XL) 25 MG 24 hr tablet Take 1.5 tablets (37.5 mg total) by mouth 2 (two)  times daily. 90 tablet 0  . mupirocin ointment (BACTROBAN) 2 % Apply 1 application topically 3 (three) times daily.     Marland Kitchen oxyCODONE (OXY IR/ROXICODONE) 5 MG immediate release tablet Take 1 tablet (5 mg total) by mouth every 8 (eight) hours as needed for severe pain. (Patient taking differently: Take 5 mg by mouth 3 (three) times daily. ) 90 tablet 0  . potassium chloride SA (K-DUR) 20 MEQ tablet Take 2 tablets (40 mEq total) by mouth 3 (three) times daily. 180 tablet 0  . PRESCRIPTION MEDICATION See admin instructions. CPAP- At bedtime and during any time of rest    . silver sulfADIAZINE (SILVADENE) 1 %  cream Apply 1 application topically daily as needed (for wound care- both legs).     . traZODone (DESYREL) 100 MG tablet Take 100 mg by mouth at bedtime.     Marland Kitchen warfarin (COUMADIN) 5 MG tablet 5 mg daily, and 2.5 mg on Mondays (Patient taking differently: Take 5 mg by mouth. ) 135 tablet 0    Assessment: 58 y.o. male admitted with CHF exacerbation, h/o mechanical AVR, MVR, Afib, to continue warfarin.  INR down to 2.3 today- likely related to held dose on 8/5. Hgb 10.3, plt 157. No s/sx of bleeding.   Patient has been been taking Coumadin 5mg  daily prior to admission.   Goal of Therapy:  Heparin level 0.3-0.7 units/mL INR 2.5-3.5 Monitor platelets by anticoagulation protocol: Yes   Plan:  Order warfarin 10 mg tonight  Daily INR.  Antonietta Jewel, PharmD, [place] Clinical Pharmacist  Pager: [phone] Phone: [phone]  04/08/2019 7:33 AM

## 2019-04-09 DIAGNOSIS — I50813 Acute on chronic right heart failure: Secondary | ICD-10-CM

## 2019-04-09 LAB — CBC
HCT: 37.7 % — ABNORMAL LOW (ref 39.0–52.0)
Hemoglobin: 10.6 g/dL — ABNORMAL LOW (ref 13.0–17.0)
MCH: 21.6 pg — ABNORMAL LOW (ref 26.0–34.0)
MCHC: 28.1 g/dL — ABNORMAL LOW (ref 30.0–36.0)
MCV: 76.9 fL — ABNORMAL LOW (ref 80.0–100.0)
Platelets: 167 10*3/uL (ref 150–400)
RBC: 4.9 MIL/uL (ref 4.22–5.81)
RDW: 24 % — ABNORMAL HIGH (ref 11.5–15.5)
WBC: 9.6 10*3/uL (ref 4.0–10.5)
nRBC: 0 % (ref 0.0–0.2)

## 2019-04-09 LAB — BASIC METABOLIC PANEL
Anion gap: 11 (ref 5–15)
BUN: 29 mg/dL — ABNORMAL HIGH (ref 6–20)
CO2: 36 mmol/L — ABNORMAL HIGH (ref 22–32)
Calcium: 8.4 mg/dL — ABNORMAL LOW (ref 8.9–10.3)
Chloride: 84 mmol/L — ABNORMAL LOW (ref 98–111)
Creatinine, Ser: 0.95 mg/dL (ref 0.61–1.24)
GFR calc Af Amer: >60 mL/min (ref >60)
GFR calc non Af Amer: >60 mL/min (ref >60)
Glucose, Bld: 177 mg/dL — ABNORMAL HIGH (ref 70–99)
Potassium: 3.3 mmol/L — ABNORMAL LOW (ref 3.5–5.1)
Sodium: 131 mmol/L — ABNORMAL LOW (ref 135–145)

## 2019-04-09 LAB — COOXEMETRY PANEL
Carboxyhemoglobin: 2.5 % — ABNORMAL HIGH (ref 0.5–1.5)
Methemoglobin: 1.2 % (ref 0.0–1.5)
O2 Saturation: 68.6 %
Total hemoglobin: 11.1 g/dL — ABNORMAL LOW (ref 12.0–16.0)

## 2019-04-09 LAB — GLUCOSE, CAPILLARY
Glucose-Capillary: 78 mg/dL (ref 70–99)
Glucose-Capillary: 85 mg/dL (ref 70–99)
Glucose-Capillary: 126 mg/dL — ABNORMAL HIGH (ref 70–99)
Glucose-Capillary: 64 mg/dL — ABNORMAL LOW (ref 70–99)
Glucose-Capillary: 78 mg/dL (ref 70–99)

## 2019-04-09 LAB — MAGNESIUM: Magnesium: 2.2 mg/dL (ref 1.7–2.4)

## 2019-04-09 LAB — PROTIME-INR
INR: 2.3 — ABNORMAL HIGH (ref 0.8–1.2)
Prothrombin Time: 24.7 seconds — ABNORMAL HIGH (ref 11.4–15.2)

## 2019-04-09 MED ORDER — GLUCOSE 40 % PO GEL
ORAL | Status: AC
  Administered 2019-04-09: 21:00:00 37.5 g via ORAL
  Filled 2019-04-09: qty 1

## 2019-04-09 MED ORDER — WARFARIN SODIUM 7.5 MG PO TABS
12.5000 mg | ORAL_TABLET | Freq: Once | ORAL | Status: AC
  Administered 2019-04-09: 12.5 mg via ORAL
  Filled 2019-04-09: qty 1

## 2019-04-09 MED ORDER — GLUCOSE 40 % PO GEL
1.0000 | ORAL | Status: AC
  Administered 2019-04-09: 37.5 g via ORAL

## 2019-04-09 NOTE — Progress Notes (Signed)
Hypoglycemic Event  CBG: 64  Treatment: oral glucose gel  Symptoms: asymptomatic  Follow-up CBG: Time:2130 CBG Result:78  Possible Reasons for Event:  Comments/MD notified: Pt given gel per protocol and orange juice per pt request. Pt remains asymptomatic.    Sarabeth Benton Cammie Mcgee

## 2019-04-09 NOTE — Progress Notes (Signed)
Patient ID: Lance Cook, male   DOB: 1961-06-24, 58 y.o.   MRN: [id number]     Advanced Heart Failure Rounding Note  PCP-Cardiologist: [name], MD   Subjective:     On 8/4, he had a severe reaction after starting Feraheme. Required 120 mg solumedrol and benadryl. Placed on Bipap, amio drip for rate control, and Neosynephrine for hypotension.  WBCs elevated but afebrile. This has resolved, off phenylephrine but still on midodrine.   Continues to diurese very briskly on lasix gtt at 15/hr + diamox - over 9L out yesterday!. Weight down 14 pounds overnight (50 pounds total). Creatinine stable. Co-ox 69% CVP 7 (checked personally)  Breathing much better. Denies orthopnea or PND.   Studies:  TEE 8/3  markedly dilated LA, LV with EF 50%, septal-lateral dyssynchrony.  Normal RV systolic size/function.  Mechanical MV with mean gradient 5 and mild peri-valvular regurgitation. Mechanical AoV with mean gradient 23, appeared to open well.   RHC 8/6:  RHC Procedural Findings: Hemodynamics (mmHg) RA mean 11 RV 46/6 PA 50/21, mean 33 PCWP mean 19 Oxygen saturations: PA 69% AO 100% Cardiac Output (Fick) 6.24  Cardiac Index (Fick) 3.32 PVR 2.2 WU PAPI 2.63 CVP/PCWP 0.59  Objective:   Weight Range: 76.4 kg Body mass index is 32.89 kg/m.   Vital Signs:   Temp:  [97.5 F (36.4 C)-98.2 F (36.8 C)] 97.7 F (36.5 C) (08/08 0800) Pulse Rate:  [83-110] 85 (08/08 0700) Resp:  [14-26] 16 (08/08 0700) BP: (91-116)/(55-77) 98/60 (08/08 0700) SpO2:  [90 %-100 %] 98 % (08/08 0700) FiO2 (%):  [21 %] 21 % (08/07 1954) Weight:  [76.4 kg] 76.4 kg (08/08 0745) Last BM Date: 04/08/19  Weight change: Filed Weights   04/07/19 0558 04/08/19 0355 04/09/19 0745  Weight: 90.2 kg 82.9 kg 76.4 kg    Intake/Output:   Intake/Output Summary (Last 24 hours) at 04/09/2019 0959 Last data filed at 04/09/2019 0900 Gross per 24 hour  Intake 1472.87 ml  Output 10650 ml  Net -9177.13 ml      Physical  Exam   General:  Sitting up in bed No resp difficulty HEENT: normal Neck: supple. JVP 7-8 Carotids 2+ bilat; no bruits. No lymphadenopathy or thryomegaly appreciated. Cor: PMI nondisplaced. Irregular rate & rhythm. Mechanical s1s2 Lungs: clear Abdomen: soft, nontender, nondistended. No hepatosplenomegaly. No bruits or masses. Good bowel sounds. Extremities: no cyanosis, clubbing, rash, 2+ edema into thighs Neuro: alert & orientedx3, cranial nerves grossly intact. moves all 4 extremities w/o difficulty. Affect pleasant   Telemetry   A fib 80s (personally reviewed)  Labs    CBC Recent Labs    04/07/19 0352  04/08/19 0411 04/09/19 0437  WBC 14.1*  --  11.2* 9.6  NEUTROABS 12.7*  --   --   --   HGB 9.5*   < > 10.3* 10.6*  HCT 33.2*   < > 35.8* 37.7*  MCV 74.3*  --  75.1* 76.9*  PLT 128*  --  157 167   < > = values in this interval not displayed.   Basic Metabolic Panel Recent Labs    04/08/19 0411 04/08/19 1600 04/09/19 0437  NA 128* 131* 131*  K 3.1* 3.8 3.3*  CL 79* 82* 84*  CO2 39* 38* 36*  GLUCOSE 183* 99 177*  BUN 35* 31* 29*  CREATININE 1.04 0.91 0.95  CALCIUM 8.3* 8.4* 8.4*  MG 1.9  --  2.2   Liver Function Tests No results for input(s): AST, ALT, ALKPHOS, BILITOT,  PROT, ALBUMIN in the last 72 hours. No results for input(s): LIPASE, AMYLASE in the last 72 hours. Cardiac Enzymes No results for input(s): CKTOTAL, CKMB, CKMBINDEX, TROPONINI in the last 72 hours.  BNP: BNP (last 3 results) Recent Labs    02/13/19 1504 04/01/19 2157  BNP 223.8* 302.1*    ProBNP (last 3 results) No results for input(s): PROBNP in the last 8760 hours.   D-Dimer No results for input(s): DDIMER in the last 72 hours. Hemoglobin A1C No results for input(s): HGBA1C in the last 72 hours. Fasting Lipid Panel No results for input(s): CHOL, HDL, LDLCALC, TRIG, CHOLHDL, LDLDIRECT in the last 72 hours. Thyroid Function Tests No results for input(s): TSH, T4TOTAL, T3FREE,  THYROIDAB in the last 72 hours.  Invalid input(s): FREET3  Other results:   Imaging    No results found.   Medications:     Scheduled Medications: . allopurinol  300 mg Oral Daily  . aspirin EC  81 mg Oral Daily  . Chlorhexidine Gluconate Cloth  6 each Topical Daily  . ferrous sulfate  325 mg Oral TID WC  . gabapentin  200 mg Oral TID  . insulin aspart  0-15 Units Subcutaneous TID WC  . insulin aspart  0-5 Units Subcutaneous QHS  . magnesium oxide  400 mg Oral BID  . methocarbamol  500 mg Oral Q8H  . midodrine  5 mg Oral TID WC  . potassium chloride  60 mEq Oral QID  . spironolactone  25 mg Oral Daily  . traZODone  100 mg Oral QHS  . Warfarin - Pharmacist Dosing Inpatient   Does not apply q1800    Infusions: . furosemide (LASIX) infusion 15 mg/hr (04/09/19 0700)  . phenylephrine (NEO-SYNEPHRINE) Adult infusion Stopped (04/06/19 1531)    PRN Medications: acetaminophen, mupirocin ointment, ondansetron (ZOFRAN) IV, oxyCODONE, silver sulfADIAZINE, sodium chloride flush  Assessment/Plan   1. Acute on chronic diastolic CHF:  Last echo in 6/20 showed preserved EF 55-60% with septal bounce from LBBB. Mean gradient was elevated across aortic valve at 30 mmHg (though not markedly elevated), and there was suggestion of mild peri-valvular regurgitation at mitral position.  On TEE this admission, EF 50% with septal-lateral dyssynchrony (c/w LBBB), normal-appearing RV, mechanical MV with mild peri-valvular regurgitation and mean gradient 5, mechanical AoV with mean gradient 23 mmHg.  He has significantly decompensated over the last several months, now with marked diuretic resistance. His atrial fibrillation is not new.  Dysfunction of his mechanical valves does not appear to explain his worsening CHF. 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  [place] 8/6 showed mildly elevated filling pressures.   - He diuresed markedly yesterday. Weight down 14 pounds overnight.  (50 pounds total) on lasix gtt @ 15/hr and diamox - CVP coming down at 7 but still with significant peripheral edema. Will drop lasix gtt to 10/hr so as not to exceed capillary refill rate - Co-ox 69%. Renal function stable    - K 3.3 Continue spironolactone and K replacement.  - He is on midodrine currently to maintain BP.  - Unna boots.  - Hopefully to po diuretics over the weekend.  Will need very close CHF clinic followup.  2. Valvular heart disease: Patient has mechanical mitral and aortic valves.  TEE showed mechanical MV with mean gradient 5 mmHg, mild peri-valvular regurgitation.  The aortic valve appeared to open reasonably well but mean gradient was 23 mmHg, ?component of mild patient-prosthesis mismatch.  No indication for any sort of  intervention.  - Continue ASA 81 and warfarin with INR goal 2.5-3.5. INR 2.3. Discussed dosing with PharmD personally. Will increase coumadin today. If INR no therapeutic tomorrow can start low-dose heparin 3. Atrial fibrillation: Chronic.  Long-standing atrial fibrillation with severely dilated atria, unlikely to have a successful cardioversion. Rate is reasonably controlled now off amiodarone.  - continue current management 4. CAD: Last cath in 4/16 with patent native coronaries, probably occluded SVG-RCA. Doubt CAD as cause of his decompensation.  5. Venous stasis ulceration: Noted on lower legs. He was started on clindamycin initially for ?of co-existing cellulitis.  Given history of recurrent C difficile he was switched to doxycycline.   - Complete doxycycline course today.  6. Type 2 diabetes: SSI.  7. OSA: CPAP.  8. Hypokalemia: Replete K and repeat BMET this afternoon.   9. Hyponatremia: Fluid restrict, 1500 cc.  If Na falls further, can dose with tolvaptan.   - Na 131 today 10. Anemia, iron-deficient: received feraheme 02/22/2019 and again on 04/05/19 but had severe reaction to feraheme. Given 120 mg solumedrol, benadryl, and placed on Bipap. Now  resolved.  - Hgb stable.  10.6 - Consider po iron as outpatient 11. ID: WBC trending up 11>18>23>14>11.2 > 9.6. Blood CX negative, afebrile.  - Got high dose Solumedrol 8/4, likely cause of high WBCs.  12. Deconditioning: PT/OT follow up. He is beginning to ambulate halls  Will transfer to [facility]  Length of Stay: [place], MD  04/09/2019, 9:59 AM  Advanced Heart Failure Team Pager [phone] (M-F; [place])  Please contact [facility] for night-coverage after hours (4p -7a ) and weekends on [email or web address]

## 2019-04-09 NOTE — Progress Notes (Signed)
[facility] for Coumadin Indication: AVR/MVR/Afib  Allergies  Allergen Reactions  . Feraheme [Ferumoxytol] Shortness Of Breath    SOB/Tachycardia  . Penicillins Hives and Rash    Has patient had a PCN reaction causing immediate rash, facial/tongue/throat swelling, SOB or lightheadedness with hypotension: Yes Has patient had a PCN reaction causing severe rash involving mucus membranes or skin necrosis: Yes Has patient had a PCN reaction that required hospitalization unsure Has patient had a PCN reaction occurring within the last 10 years: 2010-2012 If all of the above answers are "NO", then may proceed with Cephalosporin use.  [name] Other (See Comments)    CDIFF, when taking oral tablets   . Tape Other (See Comments) and Itching    Skin irritation    Patient Measurements: Height: 5' (152.4 cm) Weight: 168 lb 6.9 oz (76.4 kg) IBW/kg (Calculated) : 50  Vital Signs: Temp: 97.7 F (36.5 C) (08/08 0800) Temp Source: Oral (08/08 0731) BP: 98/60 (08/08 0700) Pulse Rate: 85 (08/08 0700)  Labs: Recent Labs    04/07/19 0352 04/07/19 0821  04/08/19 0411 04/08/19 1600 04/09/19 0437  HGB 9.5* 11.2*  10.9*  --  10.3*  --  10.6*  HCT 33.2* 33.0*  32.0*  --  35.8*  --  37.7*  PLT 128*  --   --  157  --  167  LABPROT 30.5*  --   --  25.3*  --  24.7*  INR 3.0*  --   --  2.3*  --  2.3*  CREATININE 1.24  --    < > 1.04 0.91 0.95   < > = values in this interval not displayed.    Estimated Creatinine Clearance: 73.5 mL/min (by C-G formula based on SCr of 0.95 mg/dL).   Medical History: Past Medical History:  Diagnosis Date  . Acute on chronic diastolic CHF (congestive heart failure) ([place]) 08/27/2016  . Anemia   . Anxiety   . ARDS (adult respiratory distress syndrome) ([place]) 01/27/2015  . Arthritis   . Ascending aortic dissection ([place]) 07/14/2008   Localized dissection of ascending aorta noted on CTA in 2009 and  stable on CTA in 2011  . Asymptomatic chronic venous hypertension 01/15/2010   Overview:  Overview:  Qualifier: Diagnosis of  By: [name] MD, [name]   Last Assessment & Plan:  I suggested he go to a vein clinic to see if he could be qualified compression hose of some type, since he is not a surgical candidate according to the vascular surgeons.   . Atrial fibrillation ([place])    chronic persistent  . Bell's palsy   . C. difficile diarrhea   . CAD (coronary artery disease)    Old scar inferior wall myoview, 10/2009 EF 52%.  He did have previous SVG to RCA but no obstructive disease noted on his most recent cath.  SVG occluded.   . Cellulitis 04/02/2014  . Cerebral artery occlusion with cerebral infarction ([place]) 10/08/2011   Overview:  Overview:  And hx of TIA prior to CABG, all thought due to systemic emboli prior to coumadin   . Chronic diastolic heart failure ([place]) 03/01/2015   a. 12/2015: echo showing a preserved EF of 65-70%, moderate AS, and moderate TR.   Marland Kitchen Chronic LBP 10/08/2011  . CVA (cerebral vascular accident) ([place]) 10/08/2011   And hx of TIA prior to CABG, all thought due to systemic emboli prior to coumadin   . DIABETES MELLITUS, TYPE II 11/01/2009  Qualifier: Diagnosis of  By: [name] MD, [name]    . Diverticulosis   . 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 Encephalopathy acute 02/05/2015  . Gastric polyps   . GERD (gastroesophageal reflux disease)    not needing medication at thhis time- 01/22/15  . Gout   . H/O mechanical aortic valve replacement 11/01/2009   Qualifier: Diagnosis of  By: [name], [name]    . Hiatal hernia   . History of colon polyps 12/23/2011   Overview:  Overview:  Colonoscopy January 2010, 6 mm rectal tubulovillous adenoma. No high-grade dysplasia   . HTN (hypertension) 02/23/2014  . Hyperlipidemia   . Hypertension   .  Hypogonadism male 04/08/2012  . Impaired glucose tolerance 10/08/2011  . Morbid obesity ([place]) 04/02/2014  . Myocardial infarction [facility]) age 24  . OSA (obstructive sleep apnea)    CPAP  . Peripheral vascular disease ([place])   . 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 9/93/7169   33 mm St Jude bileaflet mechanical valve placed via right mini thoracotomy approach  . 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 Severe mitral regurgitation 10/11/2014  . Testicular cancer [facility])    He was 58 y/o. He had surgical resection and rad tx's.   Marland Kitchen TIA (transient ischemic attack)    age 62  . Tubulovillous adenoma of colon   . Varicose veins   . 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 Venous (peripheral) insufficiency 02/03/2005    Medications:  No current facility-administered medications on file prior to encounter.    Current Outpatient Medications on File Prior to Encounter  Medication Sig Dispense Refill  . allopurinol (ZYLOPRIM) 300 MG tablet Take 300 mg by mouth daily.    Marland Kitchen aspirin EC 81 MG EC tablet Take 1 tablet (81 mg total) by mouth daily. 30 tablet 0  . bumetanide (BUMEX) 2 MG tablet Take 1 tablet (2 mg total) by mouth 2 (two) times daily. (Patient taking differently: Take 4 mg by mouth 2 (two) times daily. ) 60 tablet 0  . gabapentin (NEURONTIN) 300 MG capsule Take 300 mg by mouth 3 (three) times daily.    . magnesium oxide (MAG-OX) 400 (241.3 MG) MG tablet Take 1 tablet (400 mg total) by mouth 2 (two) times daily. (Patient taking differently: Take 400 mg by mouth 3 (three) times daily. ) 180 tablet 3  . methocarbamol (ROBAXIN) 500 MG tablet Take 1 tablet (500 mg total) by mouth 3 (three) times daily. 270 tablet 0  . metolazone (ZAROXOLYN) 5 MG tablet Take 10 mg by mouth every morning.    . metoprolol succinate (TOPROL-XL) 25 MG 24 hr tablet Take  1.5 tablets (37.5 mg total) by mouth 2 (two) times daily. 90 tablet 0  . mupirocin ointment (BACTROBAN) 2 % Apply 1 application topically 3 (three) times daily.     Marland Kitchen oxyCODONE (OXY IR/ROXICODONE) 5 MG immediate release tablet Take 1 tablet (5 mg total) by mouth every 8 (eight) hours as needed for severe pain. (Patient taking differently: Take 5 mg by mouth 3 (three) times daily. ) 90 tablet 0  . potassium chloride SA (K-DUR) 20 MEQ tablet Take 2 tablets (40 mEq total) by mouth 3 (three) times daily.  180 tablet 0  . PRESCRIPTION MEDICATION See admin instructions. CPAP- At bedtime and during any time of rest    . silver sulfADIAZINE (SILVADENE) 1 % cream Apply 1 application topically daily as needed (for wound care- both legs).     . traZODone (DESYREL) 100 MG tablet Take 100 mg by mouth at bedtime.     Marland Kitchen warfarin (COUMADIN) 5 MG tablet 5 mg daily, and 2.5 mg on Mondays (Patient taking differently: Take 5 mg by mouth. ) 135 tablet 0    Assessment: 58 y.o. male admitted with CHF exacerbation, h/o mechanical AVR, MVR, Afib, to continue warfarin.  INR remains slightly subtherapeutic at 2.3 again today - likely related to held dose on 8/5. Hgb stable 10.6, plt 167. No s/sx of bleeding.   Excellent fluid removal over the last day - encouraged that improvement in liver congestion will help warfarin management.  Patient has been been taking Coumadin 5mg  daily prior to admission.   Goal of Therapy:  INR 2.5-3.5 Monitor platelets by anticoagulation protocol: Yes   Plan:  Order warfarin 12.5 mg x 1 tonight  Daily INR Monitor for bleeding Heparin drip if INR not therapeutic tomorrow per MD  [name], PharmD PGY2 Cardiology Pharmacy Resident Phone [phone] 04/09/2019       10:51 AM  Please check [email or web address] for unit-specific pharmacist phone numbers

## 2019-04-10 LAB — GLUCOSE, CAPILLARY
Glucose-Capillary: 69 mg/dL — ABNORMAL LOW (ref 70–99)
Glucose-Capillary: 72 mg/dL (ref 70–99)
Glucose-Capillary: 93 mg/dL (ref 70–99)
Glucose-Capillary: 80 mg/dL (ref 70–99)
Glucose-Capillary: 76 mg/dL (ref 70–99)

## 2019-04-10 LAB — COOXEMETRY PANEL
Carboxyhemoglobin: 2.7 % — ABNORMAL HIGH (ref 0.5–1.5)
Methemoglobin: 1.1 % (ref 0.0–1.5)
O2 Saturation: 72.9 %
Total hemoglobin: 11.2 g/dL — ABNORMAL LOW (ref 12.0–16.0)

## 2019-04-10 LAB — CBC
HCT: 38.2 % — ABNORMAL LOW (ref 39.0–52.0)
Hemoglobin: 11 g/dL — ABNORMAL LOW (ref 13.0–17.0)
MCH: 21.7 pg — ABNORMAL LOW (ref 26.0–34.0)
MCHC: 28.8 g/dL — ABNORMAL LOW (ref 30.0–36.0)
MCV: 75.3 fL — ABNORMAL LOW (ref 80.0–100.0)
Platelets: 161 10*3/uL (ref 150–400)
RBC: 5.07 MIL/uL (ref 4.22–5.81)
RDW: 24.3 % — ABNORMAL HIGH (ref 11.5–15.5)
WBC: 14.2 10*3/uL — ABNORMAL HIGH (ref 4.0–10.5)
nRBC: 0 % (ref 0.0–0.2)

## 2019-04-10 LAB — BASIC METABOLIC PANEL
Anion gap: 9 (ref 5–15)
BUN: 25 mg/dL — ABNORMAL HIGH (ref 6–20)
CO2: 32 mmol/L (ref 22–32)
Calcium: 8.2 mg/dL — ABNORMAL LOW (ref 8.9–10.3)
Chloride: 91 mmol/L — ABNORMAL LOW (ref 98–111)
Creatinine, Ser: 1.02 mg/dL (ref 0.61–1.24)
GFR calc Af Amer: >60 mL/min (ref >60)
GFR calc non Af Amer: >60 mL/min (ref >60)
Glucose, Bld: 131 mg/dL — ABNORMAL HIGH (ref 70–99)
Potassium: 4.1 mmol/L (ref 3.5–5.1)
Sodium: 132 mmol/L — ABNORMAL LOW (ref 135–145)

## 2019-04-10 LAB — CULTURE, BLOOD (ROUTINE X 2)
Culture: NO GROWTH
Culture: NO GROWTH

## 2019-04-10 LAB — PROTIME-INR
INR: 2.8 — ABNORMAL HIGH (ref 0.8–1.2)
Prothrombin Time: 29.4 seconds — ABNORMAL HIGH (ref 11.4–15.2)

## 2019-04-10 LAB — MAGNESIUM: Magnesium: 1.9 mg/dL (ref 1.7–2.4)

## 2019-04-10 MED ORDER — LORATADINE 10 MG PO TABS
10.0000 mg | ORAL_TABLET | Freq: Every day | ORAL | Status: DC
  Administered 2019-04-10 – 2019-04-11 (×2): 10 mg via ORAL
  Filled 2019-04-10 (×2): qty 1

## 2019-04-10 MED ORDER — WARFARIN SODIUM 5 MG PO TABS
5.0000 mg | ORAL_TABLET | Freq: Once | ORAL | Status: AC
  Administered 2019-04-10: 5 mg via ORAL
  Filled 2019-04-10: qty 1

## 2019-04-10 MED ORDER — GLUCOSE 40 % PO GEL: 1.0000 | ORAL | Status: AC

## 2019-04-10 NOTE — Progress Notes (Signed)
[facility] for Coumadin Indication: AVR/MVR/Afib  Allergies  Allergen Reactions  . Feraheme [Ferumoxytol] Shortness Of Breath    SOB/Tachycardia  . Penicillins Hives and Rash    Has patient had a PCN reaction causing immediate rash, facial/tongue/throat swelling, SOB or lightheadedness with hypotension: Yes Has patient had a PCN reaction causing severe rash involving mucus membranes or skin necrosis: Yes Has patient had a PCN reaction that required hospitalization unsure Has patient had a PCN reaction occurring within the last 10 years: 2010-2012 If all of the above answers are "NO", then may proceed with Cephalosporin use.  [name] Other (See Comments)    CDIFF, when taking oral tablets   . Tape Other (See Comments) and Itching    Skin irritation    Patient Measurements: Height: 5' (152.4 cm) Weight: 165 lb 2 oz (74.9 kg) IBW/kg (Calculated) : 50  Vital Signs: Temp: 98.2 F (36.8 C) (08/09 0800) Temp Source: Oral (08/09 0800) BP: 108/76 (08/09 0800) Pulse Rate: 72 (08/09 0800)  Labs: Recent Labs    04/08/19 0411 04/08/19 1600 04/09/19 0437 04/10/19 0413  HGB 10.3*  --  10.6* 11.0*  HCT 35.8*  --  37.7* 38.2*  PLT 157  --  167 161  LABPROT 25.3*  --  24.7* 29.4*  INR 2.3*  --  2.3* 2.8*  CREATININE 1.04 0.91 0.95 1.02    Estimated Creatinine Clearance: 67.8 mL/min (by C-G formula based on SCr of 1.02 mg/dL).   Medical History: Past Medical History:  Diagnosis Date  . Acute on chronic diastolic CHF (congestive heart failure) ([place]) 08/27/2016  . Anemia   . Anxiety   . ARDS (adult respiratory distress syndrome) ([place]) 01/27/2015  . Arthritis   . Ascending aortic dissection ([place]) 07/14/2008   Localized dissection of ascending aorta noted on CTA in 2009 and stable on CTA in 2011  . Asymptomatic chronic venous hypertension 01/15/2010   Overview:  Overview:  Qualifier: Diagnosis of  By: [name] MD,  [name]   Last Assessment & Plan:  I suggested he go to a vein clinic to see if he could be qualified compression hose of some type, since he is not a surgical candidate according to the vascular surgeons.   . Atrial fibrillation ([place])    chronic persistent  . Bell's palsy   . C. difficile diarrhea   . CAD (coronary artery disease)    Old scar inferior wall myoview, 10/2009 EF 52%.  He did have previous SVG to RCA but no obstructive disease noted on his most recent cath.  SVG occluded.   . Cellulitis 04/02/2014  . Cerebral artery occlusion with cerebral infarction ([place]) 10/08/2011   Overview:  Overview:  And hx of TIA prior to CABG, all thought due to systemic emboli prior to coumadin   . Chronic diastolic heart failure ([place]) 03/01/2015   a. 12/2015: echo showing a preserved EF of 65-70%, moderate AS, and moderate TR.   Marland Kitchen Chronic LBP 10/08/2011  . CVA (cerebral vascular accident) ([place]) 10/08/2011   And hx of TIA prior to CABG, all thought due to systemic emboli prior to coumadin   . DIABETES MELLITUS, TYPE II 11/01/2009   Qualifier: Diagnosis of  By: [name] MD, [name]    . Diverticulosis   . 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 Encephalopathy acute 02/05/2015  .  Gastric polyps   . GERD (gastroesophageal reflux disease)    not needing medication at thhis time- 01/22/15  . Gout   . H/O mechanical aortic valve replacement 11/01/2009   Qualifier: Diagnosis of  By: [name], [name]    . Hiatal hernia   . History of colon polyps 12/23/2011   Overview:  Overview:  Colonoscopy January 2010, 6 mm rectal tubulovillous adenoma. No high-grade dysplasia   . HTN (hypertension) 02/23/2014  . Hyperlipidemia   . Hypertension   . Hypogonadism male 04/08/2012  . Impaired glucose tolerance 10/08/2011  . Morbid obesity ([place]) 04/02/2014  . Myocardial infarction [facility]) age 74  . OSA  (obstructive sleep apnea)    CPAP  . Peripheral vascular disease ([place])   . 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 2/37/6283   33 mm St Jude bileaflet mechanical valve placed via right mini thoracotomy approach  . 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 Severe mitral regurgitation 10/11/2014  . Testicular cancer [facility])    He was 58 y/o. He had surgical resection and rad tx's.   Marland Kitchen TIA (transient ischemic attack)    age 70  . Tubulovillous adenoma of colon   . Varicose veins   . 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 Venous (peripheral) insufficiency 02/03/2005    Medications:  No current facility-administered medications on file prior to encounter.    Current Outpatient Medications on File Prior to Encounter  Medication Sig Dispense Refill  . allopurinol (ZYLOPRIM) 300 MG tablet Take 300 mg by mouth daily.    Marland Kitchen aspirin EC 81 MG EC tablet Take 1 tablet (81 mg total) by mouth daily. 30 tablet 0  . bumetanide (BUMEX) 2 MG tablet Take 1 tablet (2 mg total) by mouth 2 (two) times daily. (Patient taking differently: Take 4 mg by mouth 2 (two) times daily. ) 60 tablet 0  . gabapentin (NEURONTIN) 300 MG capsule Take 300 mg by mouth 3 (three) times daily.    . magnesium oxide (MAG-OX) 400 (241.3 MG) MG tablet Take 1 tablet (400 mg total) by mouth 2 (two) times daily. (Patient taking differently: Take 400 mg by mouth 3 (three) times daily. ) 180 tablet 3  . methocarbamol (ROBAXIN) 500 MG tablet Take 1 tablet (500 mg total) by mouth 3 (three) times daily. 270 tablet 0  . metolazone (ZAROXOLYN) 5 MG tablet Take 10 mg by mouth every morning.    . metoprolol succinate (TOPROL-XL) 25 MG 24 hr tablet Take 1.5 tablets (37.5 mg total) by mouth 2 (two) times daily. 90 tablet 0  . mupirocin ointment (BACTROBAN) 2 % Apply 1 application topically 3 (three)  times daily.     Marland Kitchen oxyCODONE (OXY IR/ROXICODONE) 5 MG immediate release tablet Take 1 tablet (5 mg total) by mouth every 8 (eight) hours as needed for severe pain. (Patient taking differently: Take 5 mg by mouth 3 (three) times daily. ) 90 tablet 0  . potassium chloride SA (K-DUR) 20 MEQ tablet Take 2 tablets (40 mEq total) by mouth 3 (three) times daily. 180 tablet 0  . PRESCRIPTION MEDICATION See admin instructions. CPAP- At bedtime and during any time of rest    . silver sulfADIAZINE (SILVADENE) 1 % cream Apply 1 application topically daily as needed (for wound care- both legs).     . traZODone (DESYREL) 100 MG tablet  Take 100 mg by mouth at bedtime.     Marland Kitchen warfarin (COUMADIN) 5 MG tablet 5 mg daily, and 2.5 mg on Mondays (Patient taking differently: Take 5 mg by mouth. ) 135 tablet 0    Assessment: 58 y.o. male admitted with CHF exacerbation, h/o mechanical AVR, MVR, Afib, to continue warfarin.  INR therapeutic today at 2.8 after increased dosing over the last two days. Hgb stable 11, plt 161. No s/sx of bleeding.   Patient has been been taking Coumadin 5mg  daily prior to admission.   Goal of Therapy:  INR 2.5-3.5 Monitor platelets by anticoagulation protocol: Yes   Plan:  Order warfarin 5 mg x 1 tonight  Daily INR Monitor for bleeding  [name], PharmD PGY2 Cardiology Pharmacy Resident Phone [phone] 04/10/2019       9:01 AM  Please check [email or web address] for unit-specific pharmacist phone numbers

## 2019-04-10 NOTE — Progress Notes (Signed)
Patient ID: Lance Cook, male   DOB: 10/22/1960, 58 y.o.   MRN: [id number]     Advanced Heart Failure Rounding Note  PCP-Cardiologist: [name], MD   Subjective:     On 8/4, he had a severe reaction after starting Feraheme. Required 120 mg solumedrol and benadryl. Placed on Bipap, amio drip for rate control, and Neosynephrine for hypotension.  WBCs elevated but afebrile. This has resolved, off phenylephrine but still on midodrine.   Lasix drip turned down to 10/hr yesterday. On diamox. Still with over 5L out. Weight pending  Creatinine stable but WBC jumped back up to 14.2. Afebrile  Denies SOB, orthopnea or PND  Studies:  TEE 8/3  markedly dilated LA, LV with EF 50%, septal-lateral dyssynchrony.  Normal RV systolic size/function.  Mechanical MV with mean gradient 5 and mild peri-valvular regurgitation. Mechanical AoV with mean gradient 23, appeared to open well.   RHC 8/6:  RHC Procedural Findings: Hemodynamics (mmHg) RA mean 11 RV 46/6 PA 50/21, mean 33 PCWP mean 19 Oxygen saturations: PA 69% AO 100% Cardiac Output (Fick) 6.24  Cardiac Index (Fick) 3.32 PVR 2.2 WU PAPI 2.63 CVP/PCWP 0.59  Objective:   Weight Range: 76.4 kg Body mass index is 32.89 kg/m.   Vital Signs:   Temp:  [97.7 F (36.5 C)-98.3 F (36.8 C)] 98 F (36.7 C) (08/09 0400) Pulse Rate:  [59-108] 59 (08/09 0400) Resp:  [15-30] 20 (08/09 0400) BP: (91-127)/(50-90) 104/67 (08/09 0400) SpO2:  [94 %-100 %] 97 % (08/09 0400) Weight:  [76.4 kg] 76.4 kg (08/08 0745) Last BM Date: 04/09/19  Weight change: Filed Weights   04/07/19 0558 04/08/19 0355 04/09/19 0745  Weight: 90.2 kg 82.9 kg 76.4 kg    Intake/Output:   Intake/Output Summary (Last 24 hours) at 04/10/2019 0457 Last data filed at 04/10/2019 0400 Gross per 24 hour  Intake 870.3 ml  Output 5050 ml  Net -4179.7 ml      Physical Exam   General:  Sitting up in bed. No resp difficulty HEENT: normal Neck: supple. JVP 9. Carotids  2+ bilat; no bruits. No lymphadenopathy or thryomegaly appreciated. Cor: PMI nondisplaced. Irregular rate & rhythm. Mechanical s2.s2 Lungs: clear Abdomen: soft, nontender, nondistended. No hepatosplenomegaly. No bruits or masses. Good bowel sounds. Extremities: no cyanosis, clubbing, rash, 1+ edema into thighs Neuro: alert & orientedx3, cranial nerves grossly intact. moves all 4 extremities w/o difficulty. Affect pleasant   Telemetry   A fib 90s + PVCs (personally reviewed)  Labs    CBC Recent Labs    04/09/19 0437 04/10/19 0413  WBC 9.6 14.2*  HGB 10.6* 11.0*  HCT 37.7* 38.2*  MCV 76.9* 75.3*  PLT 167 924   Basic Metabolic Panel Recent Labs    04/09/19 0437 04/10/19 0413  NA 131* 132*  K 3.3* 4.1  CL 84* 91*  CO2 36* 32  GLUCOSE 177* 131*  BUN 29* 25*  CREATININE 0.95 1.02  CALCIUM 8.4* 8.2*  MG 2.2 1.9   Liver Function Tests No results for input(s): AST, ALT, ALKPHOS, BILITOT, PROT, ALBUMIN in the last 72 hours. No results for input(s): LIPASE, AMYLASE in the last 72 hours. Cardiac Enzymes No results for input(s): CKTOTAL, CKMB, CKMBINDEX, TROPONINI in the last 72 hours.  BNP: BNP (last 3 results) Recent Labs    02/13/19 1504 04/01/19 2157  BNP 223.8* 302.1*    ProBNP (last 3 results) No results for input(s): PROBNP in the last 8760 hours.   D-Dimer No results for input(s): DDIMER  in the last 72 hours. Hemoglobin A1C No results for input(s): HGBA1C in the last 72 hours. Fasting Lipid Panel No results for input(s): CHOL, HDL, LDLCALC, TRIG, CHOLHDL, LDLDIRECT in the last 72 hours. Thyroid Function Tests No results for input(s): TSH, T4TOTAL, T3FREE, THYROIDAB in the last 72 hours.  Invalid input(s): FREET3  Other results:   Imaging    No results found.   Medications:     Scheduled Medications: . allopurinol  300 mg Oral Daily  . aspirin EC  81 mg Oral Daily  . Chlorhexidine Gluconate Cloth  6 each Topical Daily  . ferrous sulfate   325 mg Oral TID WC  . gabapentin  200 mg Oral TID  . insulin aspart  0-15 Units Subcutaneous TID WC  . insulin aspart  0-5 Units Subcutaneous QHS  . magnesium oxide  400 mg Oral BID  . methocarbamol  500 mg Oral Q8H  . midodrine  5 mg Oral TID WC  . potassium chloride  60 mEq Oral QID  . spironolactone  25 mg Oral Daily  . traZODone  100 mg Oral QHS  . Warfarin - Pharmacist Dosing Inpatient   Does not apply q1800    Infusions: . furosemide (LASIX) infusion 10 mg/hr (04/10/19 0400)  . phenylephrine (NEO-SYNEPHRINE) Adult infusion Stopped (04/06/19 1531)    PRN Medications: acetaminophen, mupirocin ointment, ondansetron (ZOFRAN) IV, oxyCODONE, silver sulfADIAZINE, sodium chloride flush  Assessment/Plan   1. Acute on chronic diastolic CHF:  Last echo in 6/20 showed preserved EF 55-60% with septal bounce from LBBB. Mean gradient was elevated across aortic valve at 30 mmHg (though not markedly elevated), and there was suggestion of mild peri-valvular regurgitation at mitral position.  On TEE this admission, EF 50% with septal-lateral dyssynchrony (c/w LBBB), normal-appearing RV, mechanical MV with mild peri-valvular regurgitation and mean gradient 5, mechanical AoV with mean gradient 23 mmHg.  He has significantly decompensated over the last several months, now with marked diuretic resistance. His atrial fibrillation is not new.  Dysfunction of his mechanical valves does not appear to explain his worsening CHF. 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  [place] 8/6 showed mildly elevated filling pressures.   - Continues to diurese well on lasix gtt @ 10/hr and diamox. Weight down over 50 pounds - CVP 9-10 with significant peripheral edema. Continue lasix gtt @ 10/hr for one more day - Co-ox 73%. Renal function stable    - K 4.1 Continue spironolactone and K replacement as needed - He is on midodrine currently to maintain BP.  - [place] boots.  2. Valvular heart disease:  Patient has mechanical mitral and aortic valves.  TEE showed mechanical MV with mean gradient 5 mmHg, mild peri-valvular regurgitation.  The aortic valve appeared to open reasonably well but mean gradient was 23 mmHg, ?component of mild patient-prosthesis mismatch.  No indication for any sort of intervention.  - Continue ASA 81 and warfarin with INR goal 2.5-3.5. INR 2.8. Discussed dosing with PharmD personally. 3. Atrial fibrillation: Chronic.  Long-standing atrial fibrillation with severely dilated atria, unlikely to have a successful cardioversion. Rate is reasonably controlled now off amiodarone.  - continue current management 4. CAD: Last cath in 4/16 with patent native coronaries, probably occluded SVG-RCA. Doubt CAD as cause of his decompensation.  5. Venous stasis ulceration: Noted on lower legs. He was started on clindamycin initially for ?of co-existing cellulitis.  Given history of recurrent C difficile he was switched to doxycycline.   - Complete doxycycline course  today.  6. Type 2 diabetes: SSI.  7. OSA: CPAP.  8. Hypokalemia: K 4.1 today 9. Hyponatremia: Fluid restrict, 1500 cc.  If Na falls further, can dose with tolvaptan.   - Na 132 today 10. Anemia, iron-deficient: received feraheme 02/22/2019 and again on 04/05/19 but had severe reaction to feraheme. Given 120 mg solumedrol, benadryl, and placed on Bipap. Now resolved.  - Hgb stable.  11.0 - Consider po iron as outpatient 11. ID: WBC trending up 11>18>23>14>11.2 > 9.6 -> 142. Blood CX negative, afebrile.  - Got high dose Solumedrol 8/4, likely initial cause of high WBCs. - WBC now bumping back up. Watch closely for infection   12. Deconditioning: PT/OT follow up. He is beginning to ambulate halls  Pending transfer to [facility]  Length of Stay: [place], MD  04/10/2019, 4:57 AM  Advanced Heart Failure Team Pager [phone] (M-F; 7a - 4p)  Please contact [facility] for night-coverage after hours (4p -7a ) and weekends  on [email or web address]

## 2019-04-10 NOTE — Progress Notes (Signed)
Patient wearing CPAP when RT entered room. Patient tolerating CPAP well at this time. RT will monitor as needed.

## 2019-04-10 NOTE — Plan of Care (Signed)
  Problem: Education: Goal: Ability to demonstrate management of disease process will improve Outcome: Progressing Goal: Ability to verbalize understanding of medication therapies will improve Outcome: Progressing Goal: Individualized Educational Video(s) Outcome: Progressing   Problem: Activity: Goal: Capacity to carry out activities will improve Outcome: Progressing   Problem: Cardiac: Goal: Ability to achieve and maintain adequate cardiopulmonary perfusion will improve Outcome: Progressing   Problem: Education: Goal: Knowledge of General Education information will improve Description: Including pain rating scale, medication(s)/side effects and non-pharmacologic comfort measures Outcome: Progressing   Problem: Health Behavior/Discharge Planning: Goal: Ability to manage health-related needs will improve Outcome: Progressing   Problem: Clinical Measurements: Goal: Ability to maintain clinical measurements within normal limits will improve Outcome: Progressing Goal: Will remain free from infection Outcome: Progressing Goal: Diagnostic test results will improve Outcome: Progressing Goal: Respiratory complications will improve Outcome: Progressing Goal: Cardiovascular complication will be avoided Outcome: Progressing   Problem: Activity: Goal: Risk for activity intolerance will decrease Outcome: Progressing   Problem: Nutrition: Goal: Adequate nutrition will be maintained Outcome: Progressing   Problem: Coping: Goal: Level of anxiety will decrease Outcome: Progressing   Problem: Elimination: Goal: Will not experience complications related to bowel motility Outcome: Progressing Goal: Will not experience complications related to urinary retention Outcome: Progressing   Problem: Pain Managment: Goal: General experience of comfort will improve Outcome: Progressing   Problem: Safety: Goal: Ability to remain free from injury will improve Outcome: Progressing    Problem: Skin Integrity: Goal: Risk for impaired skin integrity will decrease Outcome: Progressing   Problem: Education: Goal: Understanding of CV disease, CV risk reduction, and recovery process will improve Outcome: Progressing Goal: Individualized Educational Video(s) Outcome: Progressing   Problem: Activity: Goal: Ability to return to baseline activity level will improve Outcome: Progressing   Problem: Cardiovascular: Goal: Ability to achieve and maintain adequate cardiovascular perfusion will improve Outcome: Progressing Goal: Vascular access site(s) Level 0-1 will be maintained Outcome: Progressing   Problem: Health Behavior/Discharge Planning: Goal: Ability to safely manage health-related needs after discharge will improve Outcome: Progressing   

## 2019-04-10 NOTE — Plan of Care (Signed)
  Problem: Education: Goal: Ability to demonstrate management of disease process will improve Outcome: Progressing   Problem: Clinical Measurements: Goal: Ability to maintain clinical measurements within normal limits will improve Outcome: Progressing   Problem: Elimination: Goal: Will not experience complications related to bowel motility Outcome: Progressing   Problem: Pain Managment: Goal: General experience of comfort will improve Outcome: Progressing

## 2019-04-11 LAB — GLUCOSE, CAPILLARY: Glucose-Capillary: 66 mg/dL — ABNORMAL LOW (ref 70–99)

## 2019-04-11 LAB — CBC
HCT: 36.2 % — ABNORMAL LOW (ref 39.0–52.0)
Hemoglobin: 10.4 g/dL — ABNORMAL LOW (ref 13.0–17.0)
MCH: 21.6 pg — ABNORMAL LOW (ref 26.0–34.0)
MCHC: 28.7 g/dL — ABNORMAL LOW (ref 30.0–36.0)
MCV: 75.3 fL — ABNORMAL LOW (ref 80.0–100.0)
Platelets: 163 10*3/uL (ref 150–400)
RBC: 4.81 MIL/uL (ref 4.22–5.81)
RDW: 24.6 % — ABNORMAL HIGH (ref 11.5–15.5)
WBC: 10.4 10*3/uL (ref 4.0–10.5)
nRBC: 0 % (ref 0.0–0.2)

## 2019-04-11 LAB — BASIC METABOLIC PANEL
Anion gap: 8 (ref 5–15)
BUN: 26 mg/dL — ABNORMAL HIGH (ref 6–20)
CO2: 32 mmol/L (ref 22–32)
Calcium: 8.1 mg/dL — ABNORMAL LOW (ref 8.9–10.3)
Chloride: 94 mmol/L — ABNORMAL LOW (ref 98–111)
Creatinine, Ser: 1.12 mg/dL (ref 0.61–1.24)
GFR calc Af Amer: >60 mL/min (ref >60)
GFR calc non Af Amer: >60 mL/min (ref >60)
Glucose, Bld: 79 mg/dL (ref 70–99)
Potassium: 3.8 mmol/L (ref 3.5–5.1)
Sodium: 134 mmol/L — ABNORMAL LOW (ref 135–145)

## 2019-04-11 LAB — COOXEMETRY PANEL
Carboxyhemoglobin: 2.4 % — ABNORMAL HIGH (ref 0.5–1.5)
Methemoglobin: 1.2 % (ref 0.0–1.5)
O2 Saturation: 67.7 %
Total hemoglobin: 10.9 g/dL — ABNORMAL LOW (ref 12.0–16.0)

## 2019-04-11 LAB — MAGNESIUM: Magnesium: 2 mg/dL (ref 1.7–2.4)

## 2019-04-11 LAB — PROTIME-INR
INR: 3.9 — ABNORMAL HIGH (ref 0.8–1.2)
Prothrombin Time: 37.2 seconds — ABNORMAL HIGH (ref 11.4–15.2)

## 2019-04-11 MED ORDER — SPIRONOLACTONE 25 MG PO TABS: 25.0000 mg | ORAL_TABLET | Freq: Every day | ORAL | 6 refills | Status: AC

## 2019-04-11 MED ORDER — TORSEMIDE 20 MG PO TABS: 60.0000 mg | ORAL_TABLET | Freq: Two times a day (BID) | ORAL | 6 refills | Status: DC

## 2019-04-11 MED ORDER — TORSEMIDE 20 MG PO TABS
60.0000 mg | ORAL_TABLET | Freq: Two times a day (BID) | ORAL | Status: DC
  Administered 2019-04-11: 60 mg via ORAL
  Filled 2019-04-11: qty 3

## 2019-04-11 MED ORDER — MIDODRINE HCL 5 MG PO TABS: 5.0000 mg | ORAL_TABLET | Freq: Three times a day (TID) | ORAL | 6 refills | Status: DC

## 2019-04-11 MED ORDER — METOPROLOL SUCCINATE ER 25 MG PO TB24: 25.0000 mg | ORAL_TABLET | Freq: Two times a day (BID) | ORAL | 6 refills | Status: DC

## 2019-04-11 MED ORDER — POTASSIUM CHLORIDE CRYS ER 20 MEQ PO TBCR
40.0000 meq | EXTENDED_RELEASE_TABLET | Freq: Three times a day (TID) | ORAL | Status: DC
  Administered 2019-04-11: 40 meq via ORAL
  Filled 2019-04-11: qty 2

## 2019-04-11 MED ORDER — METOPROLOL SUCCINATE ER 25 MG PO TB24
25.0000 mg | ORAL_TABLET | Freq: Every day | ORAL | Status: DC
  Administered 2019-04-11: 25 mg via ORAL
  Filled 2019-04-11: qty 1

## 2019-04-11 NOTE — Progress Notes (Signed)
Patient ID: Lance Cook, male   DOB: [birth year], 58 y.o.   MRN: [id number]     Advanced Heart Failure Rounding Note  PCP-Cardiologist: [name], MD   Subjective:     On 8/4, he had a severe reaction after starting Feraheme. Required 120 mg solumedrol and benadryl. Placed on Bipap, amio drip for rate control, and Neosynephrine for hypotension.  WBCs elevated but afebrile. This has resolved, off phenylephrine but still on midodrine.   He diuresed well again yesterday.  Creatinine stable.  CVP 8 this morning, co-ox 68%.   Studies:  TEE 8/3  markedly dilated LA, LV with EF 50%, septal-lateral dyssynchrony.  Normal RV systolic size/function.  Mechanical MV with mean gradient 5 and mild peri-valvular regurgitation. Mechanical AoV with mean gradient 23, appeared to open well.   RHC 8/6:  RHC Procedural Findings: Hemodynamics (mmHg) RA mean 11 RV 46/6 PA 50/21, mean 33 PCWP mean 19 Oxygen saturations: PA 69% AO 100% Cardiac Output (Fick) 6.24  Cardiac Index (Fick) 3.32 PVR 2.2 WU PAPI 2.63 CVP/PCWP 0.59  Objective:   Weight Range: 75.1 kg Body mass index is 32.33 kg/m.   Vital Signs:   Temp:  [97.5 F (36.4 C)-98.2 F (36.8 C)] 97.9 F (36.6 C) (08/10 0300) Pulse Rate:  [72-111] 88 (08/10 0300) Resp:  [17-26] 21 (08/10 0300) BP: (103-109)/(69-76) 103/70 (08/10 0300) SpO2:  [91 %-100 %] 97 % (08/10 0300) Weight:  [75.1 kg] 75.1 kg (08/10 0300) Last BM Date: 04/10/19  Weight change: Filed Weights   04/09/19 0745 04/10/19 0500 04/11/19 0300  Weight: 76.4 kg 74.9 kg 75.1 kg    Intake/Output:   Intake/Output Summary (Last 24 hours) at 04/11/2019 0742 Last data filed at 04/11/2019 0400 Gross per 24 hour  Intake 1169.3 ml  Output 3150 ml  Net -1980.7 ml      Physical Exam   General: NAD Neck: JVP 8 cm, no thyromegaly or thyroid nodule.  Lungs: Clear to auscultation bilaterally with normal respiratory effort. CV: Nondisplaced PMI.  Heart irregular S1/S2 with  mechanical S2, no S3/S4, 2/6 HSM LLSB.  1+ ankle edema.   Abdomen: Soft, nontender, no hepatosplenomegaly, no distention.  Skin: Intact without lesions or rashes.  Neurologic: Alert and oriented x 3.  Psych: Normal affect. Extremities: No clubbing or cyanosis.  HEENT: Normal.    Telemetry   A fib 90s-100s + PVCs (personally reviewed)  Labs    CBC Recent Labs    04/10/19 0413 04/11/19 0445  WBC 14.2* 10.4  HGB 11.0* 10.4*  HCT 38.2* 36.2*  MCV 75.3* 75.3*  PLT 161 324   Basic Metabolic Panel Recent Labs    04/10/19 0413 04/11/19 0445  NA 132* 134*  K 4.1 3.8  CL 91* 94*  CO2 32 32  GLUCOSE 131* 79  BUN 25* 26*  CREATININE 1.02 1.12  CALCIUM 8.2* 8.1*  MG 1.9 2.0   Liver Function Tests No results for input(s): AST, ALT, ALKPHOS, BILITOT, PROT, ALBUMIN in the last 72 hours. No results for input(s): LIPASE, AMYLASE in the last 72 hours. Cardiac Enzymes No results for input(s): CKTOTAL, CKMB, CKMBINDEX, TROPONINI in the last 72 hours.  BNP: BNP (last 3 results) Recent Labs    02/13/19 1504 04/01/19 2157  BNP 223.8* 302.1*    ProBNP (last 3 results) No results for input(s): PROBNP in the last 8760 hours.   D-Dimer No results for input(s): DDIMER in the last 72 hours. Hemoglobin A1C No results for input(s): HGBA1C in the last  72 hours. Fasting Lipid Panel No results for input(s): CHOL, HDL, LDLCALC, TRIG, CHOLHDL, LDLDIRECT in the last 72 hours. Thyroid Function Tests No results for input(s): TSH, T4TOTAL, T3FREE, THYROIDAB in the last 72 hours.  Invalid input(s): FREET3  Other results:   Imaging    No results found.   Medications:     Scheduled Medications:  allopurinol  300 mg Oral Daily   aspirin EC  81 mg Oral Daily   Chlorhexidine Gluconate Cloth  6 each Topical Daily   ferrous sulfate  325 mg Oral TID WC   gabapentin  200 mg Oral TID   insulin aspart  0-15 Units Subcutaneous TID WC   insulin aspart  0-5 Units Subcutaneous  QHS   loratadine  10 mg Oral Daily   magnesium oxide  400 mg Oral BID   methocarbamol  500 mg Oral Q8H   midodrine  5 mg Oral TID WC   potassium chloride  40 mEq Oral TID   spironolactone  25 mg Oral Daily   torsemide  60 mg Oral BID   traZODone  100 mg Oral QHS   Warfarin - Pharmacist Dosing Inpatient   Does not apply q1800    Infusions:  phenylephrine (NEO-SYNEPHRINE) Adult infusion Stopped (04/06/19 1531)    PRN Medications: acetaminophen, mupirocin ointment, ondansetron (ZOFRAN) IV, oxyCODONE, silver sulfADIAZINE, sodium chloride flush  Assessment/Plan   1. Acute on chronic diastolic CHF:  Last echo in 6/20 showed preserved EF 55-60% with septal bounce from LBBB. Mean gradient was elevated across aortic valve at 30 mmHg (though not markedly elevated), and there was suggestion of mild peri-valvular regurgitation at mitral position.  On TEE this admission, EF 50% with septal-lateral dyssynchrony (c/w LBBB), normal-appearing RV, mechanical MV with mild peri-valvular regurgitation and mean gradient 5, mechanical AoV with mean gradient 23 mmHg.  He has significantly decompensated over the last several months, now with marked diuretic resistance. His atrial fibrillation is not new.  Dysfunction of his mechanical valves does not appear to explain his worsening CHF. 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  [place] 8/6 showed mildly elevated filling pressures.  CVP down to 8 today. Renal function stable.  - Stop Lasix, start torsemide 60 mg bid.  Will send home on KCl 40 tid (was on prior to admission).  - Continue spironolactone.  - He is on midodrine currently to maintain BP, will continue for now.  2. Valvular heart disease: Patient has mechanical mitral and aortic valves.  TEE showed mechanical MV with mean gradient 5 mmHg, mild peri-valvular regurgitation.  The aortic valve appeared to open reasonably well but mean gradient was 23 mmHg, ?component of mild  patient-prosthesis mismatch.  No indication for any sort of intervention.  - Continue ASA 81 and warfarin with INR goal 2.5-3.5. INR 2.8. Discussed dosing with PharmD personally. 3. Atrial fibrillation: Chronic.  Long-standing atrial fibrillation with severely dilated atria, unlikely to have a successful cardioversion. Rate is reasonably controlled now off amiodarone.  - Can restart home Toprol XL 25 daily.  4. CAD: Last cath in 4/16 with patent native coronaries, probably occluded SVG-RCA. Doubt CAD as cause of his decompensation.  5. Venous stasis ulceration: Noted on lower legs. He was started on clindamycin initially for ?of co-existing cellulitis.  Given history of recurrent C difficile he was switched to doxycycline.   - He has completed doxycycline.   6. Type 2 diabetes: SSI.  7. OSA: CPAP.  8. Hypokalemia: K 3.8 today.  9. Hyponatremia:  Fluid restrict, 1500 cc. 10. Anemia, iron-deficient: received feraheme 02/22/2019 and again on 04/05/19 but had severe reaction to feraheme. Given 120 mg solumedrol, benadryl, and placed on Bipap. Now resolved.  - Hgb stable.  - Consider po iron as outpatient 11. ID: WBC trending up 11>18>23>14>11.2 > 9.6 -> 14 -> 10.4. Blood CX negative, afebrile.  - Got high dose Solumedrol 8/4, likely initial cause of high WBCs. 12. Deconditioning: PT/OT follow up. He is beginning to ambulate halls 13. Disposition: He can go home today.  Will need close followup (next week) in CHF clinic.  BMET next week.  Meds for home: midodrine 5 mg tid, ASA 81, warfarin goal INR 2.5-3.5, torsemide 60 mg bid, KCl 40 tid, Toprol XL 25 daily, spironolactone 25 daily.  Pending transfer to [facility]  Length of Stay: [place], [place]  04/11/2019, 7:42 AM  Advanced Heart Failure Team Pager [phone] (M-F; 7a - 4p)  Please contact [facility] for night-coverage after hours (4p -7a ) and weekends on [email or web address]

## 2019-04-11 NOTE — Progress Notes (Signed)
Physical Therapy Treatment Patient Details Name: Lance Cook MRN: [id number] DOB: 18-Aug-1961 Today's Date: 04/11/2019    History of Present Illness 58 yo admitted with HF exacerbation. PMHx: CHF, AVR, MVR, CAD, AFib, AA dissection, DM, HTN, PVD, testicular CA, CVA at 58yo    PT Comments    Pt admitted with above diagnosis. Pt currently with functional limitations due to balance and endurance deficits. Pt was able to ambulate with RW with Modif I.  Pt ready to d/c and excited to go home.  Demonstrates good safety.  HHPT recommended.   Pt will benefit from skilled PT to increase their independence and safety with mobility to allow discharge to the venue listed below.     Follow Up Recommendations  Home health PT     Equipment Recommendations  None recommended by PT    Recommendations for Other Services       Precautions / Restrictions Precautions Precautions: Fall Restrictions Weight Bearing Restrictions: No    Mobility  Bed Mobility Overal bed mobility: Needs Assistance Bed Mobility: Supine to Sit     Supine to sit: Independent Sit to supine: Independent      Transfers Overall transfer level: Needs assistance Equipment used: Rolling walker (2 wheeled) Transfers: Sit to/from Stand Sit to Stand: Independent            Ambulation/Gait Ambulation/Gait assistance: Modified independent (Device/Increase time) Gait Distance (Feet): 250 Feet Assistive device: Rolling walker (2 wheeled) Gait Pattern/deviations: Step-through pattern;Wide base of support;Decreased stride length   Gait velocity interpretation: >2.62 ft/sec, indicative of community ambulatory General Gait Details: wide BOS and increased lateral sway with LE edema, occasional cues to stay close to rW.  STates he has RW at home.     Stairs             Wheelchair Mobility    Modified Rankin (Stroke Patients Only)       Balance Overall balance assessment: Needs assistance Sitting-balance  support: No upper extremity supported;Feet supported Sitting balance-Leahy Scale: Good     Standing balance support: During functional activity;No upper extremity supported Standing balance-Leahy Scale: Fair Standing balance comment: can stand without UE support or external support                            Cognition Arousal/Alertness: Awake/alert Behavior During Therapy: WFL for tasks assessed/performed Overall Cognitive Status: Within Functional Limits for tasks assessed                                        Exercises General Exercises - Lower Extremity Ankle Circles/Pumps: AROM;20 reps;Both;Supine    General Comments General comments (skin integrity, edema, etc.): VSS      Pertinent Vitals/Pain Pain Assessment: Faces Faces Pain Scale: Hurts little more Pain Location: legs and back Pain Descriptors / Indicators: Aching Pain Intervention(s): Limited activity within patient's tolerance;Monitored during session;Repositioned    Home Living                      Prior Function            PT Goals (current goals can now be found in the care plan section) Acute Rehab PT Goals Patient Stated Goal: return home with my wife Progress towards PT goals: Progressing toward goals    Frequency    Min 3X/week  PT Plan Current plan remains appropriate    Co-evaluation              AM-PAC PT "6 Clicks" Mobility   Outcome Measure  Help needed turning from your back to your side while in a flat bed without using bedrails?: None Help needed moving from lying on your back to sitting on the side of a flat bed without using bedrails?: None Help needed moving to and from a bed to a chair (including a wheelchair)?: None Help needed standing up from a chair using your arms (e.g., wheelchair or bedside chair)?: None Help needed to walk in hospital room?: None Help needed climbing 3-5 steps with a railing? : None 6 Click Score: 24     End of Session Equipment Utilized During Treatment: Gait belt Activity Tolerance: Patient tolerated treatment well Patient left: with call bell/phone within reach;in bed(sitting on EOB) Nurse Communication: Mobility status PT Visit Diagnosis: Other abnormalities of gait and mobility (R26.89);Muscle weakness (generalized) (M62.81)     Time: 7340-3709 PT Time Calculation (min) (ACUTE ONLY): 18 min  Charges:  $Gait Training: 8-22 mins                     [place] Pager:  [phone]  Office:  [place] 04/11/2019, 11:34 AM

## 2019-04-11 NOTE — Discharge Summary (Signed)
Advanced Heart Failure Team  Discharge Summary   Patient ID: Lance Cook MRN: [id number], DOB/AGE: 11-17-1960 58 y.o. Admit date: 04/01/2019 D/C date:     04/11/2019   Primary Discharge Diagnoses:  1. Acute on chronic diastolic CHF 2. Valvular heart disease 3. Atrial fibrillation: Chronic. 4. CAD: Last cath in 4/16 with patent native coronaries, probably occluded SVG-RCA. Doubt CAD as cause of his decompensation.  5. Venous stasis ulceration  6. Type 2 diabetes: SSI.  7. OSA: CPAP. 8. Hypokalemia.  9. Hyponatremia 10. Anemia, iron-deficient 11. ID 12. Deconditioning   Hospital Course:  Lance Cook is a  58 y.o. with history of Bentall procedure with [employer] Jude mechanical aortic alve and conduit in 1988.  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  This procedure was complicated by ARDS and prolonged time on the vent.  He had SVG-RCA with original surgery.  Last cath in 4/16 prior to MV surgery showed patent native coronaries, SVG-RCA likely occluded (never identified).  He has had chronic atrial fibrillation and has history of CVA.   Initially after MV surgery, he did fairly well. 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  He had reasonable quality of life on po Lasix with occasional episodes of fluid retention that were manageable.  However, starting about 3-4 months ago, he began to decompensate and has not responded well to outpatient medication adjustment.  He was admitted in 6/20 and was diuresed with IV Lasix, he lost 20 lbs.  However, after discharge, po diuretics were not effective at managing the re-development of volume overload.  He was initially on high dose Lasix, then transitioned over to Bumex which was most recently increased to 4 mg bid.  Prior to admission, he was taking metolazone 5 mg bid as well.  Despite this (and several doses of IV Lasix at home), weight has gone back up 20 lbs and he is short of breath walking  around the house.   Admitted with marked volume overload. Diuresed with lasix drip and later transitioned to torsemide 60 mg twice a day. He will continued to be followed closely in the HF clinic and has follow up next week. Plan to check CBC, BMET at that time.   1. Acute on chronic diastolic CHF: Last echo in 6/20 showed preserved EF 55-60% with septal bounce from LBBB. Mean gradient was elevated across aortic valve at 30 mmHg (though not markedly elevated), and there was suggestion of mild peri-valvular regurgitation at mitral position.  On TEE this admission, EF 50% with septal-lateral dyssynchrony (c/w LBBB), normal-appearing RV, mechanical MV with mild peri-valvular regurgitation and mean gradient 5, mechanical AoV with mean gradient 23 mmHg. He has significantly decompensated over the last several months, now with marked diuretic resistance. His atrial fibrillation is not new.  Dysfunction of his mechanical valves does not appear to explain his worsening CHF. 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  [place] 8/6 showed mildly elevated filling pressures.   Diuresed with lasix drip and transitioned to torsemide 60 mg bid.  Will send home on KCl 40 tid (was on prior to admission).  - Continue spironolactone.  - He is on midodrine currently to maintain BP, will continue for now.  2. Valvular heart disease: Patient has mechanical mitral and aortic valves. TEE showed mechanical MV with mean gradient 5 mmHg, mild peri-valvular regurgitation.  The aortic valve appeared to open reasonably well but mean gradient was  23 mmHg, ?component of mild patient-prosthesis mismatch.  No indication for any sort of intervention.  - Continue ASA 81 and warfarin with INR goal 2.5-3.5. INR 3.9.  - He will continue home coumadin regimen. HH to repeat INR 8/14 with results faxed to coumadin clinic. . 3. Atrial fibrillation: Chronic. Long-standing atrial fibrillation with severely dilated atria,  unlikely to have a successful cardioversion. Rate is reasonably controlled now off amiodarone.  - Can restart home Toprol XL 25 daily.  4. CAD: Last cath in 4/16 with patent native coronaries, probably occluded SVG-RCA. Doubt CAD as cause of his decompensation.  5. Venous stasis ulceration: Noted on lower legs. He was started on clindamycin initially for ?of co-existing cellulitis. Given history of recurrent C difficile he was switched to doxycycline.   - He has completed doxycycline.   6. Type 2 diabetes: SSI.  7. OSA: CPAP. 8. Hypokalemia:K was followed closely.  9. Hyponatremia: Fluid restrict, 1500 cc. 10. Anemia, iron-deficient: received feraheme 02/22/2019 and again on 04/05/19 but had severe reaction to feraheme. Given 120 mg solumedrol, benadryl, and placed on Bipap. Now resolved.  - Hgb stable.  - Consider po iron as outpatient 11. ID: WBC peaked at 18 after dose of solumedrol.  Blood CX negative, afebrile.  - Got high dose Solumedrol 8/4, likely initial cause of high WBCs. 12. Deconditioning: PT/OT follow up. HH will continue follow. [facility] will resume care.   Disposition: He can go home today.  Will need close followup (next week) in CHF clinic.  BMET next week.  Meds for home: midodrine 5 mg tid, ASA 81, warfarin goal INR 2.5-3.5, torsemide 60 mg bid, KCl 40 tid, Toprol XL 25 daily, spironolactone 25 daily. Discharge Weight: 165.5 pounds.  Discharge Vitals: Blood pressure 103/70, pulse 88, temperature 97.9 F (36.6 C), temperature source Oral, resp. rate (!) 21, height 5' (1.524 m), weight 75.1 kg, SpO2 97 %.  Labs: Lab Results  Component Value Date   WBC 10.4 04/11/2019   HGB 10.4 (L) 04/11/2019   HCT 36.2 (L) 04/11/2019   MCV 75.3 (L) 04/11/2019   PLT 163 04/11/2019    Recent Labs  Lab 04/05/19 1222  04/11/19 0445  NA 134*   < > 134*  K 4.2   < > 3.8  CL 88*   < > 94*  CO2 35*   < > 32  BUN 30*   < > 26*  CREATININE 1.28*   < > 1.12  CALCIUM 8.1*   < > 8.1*  PROT  4.8*  --   --   BILITOT 1.3*  --   --   ALKPHOS 87  --   --   ALT 30  --   --   AST 94*  --   --   GLUCOSE 82   < > 79   < > = values in this interval not displayed.   Lab Results  Component Value Date   CHOL 118 08/18/2012   HDL 27.20 (L) 08/18/2012   LDLCALC 64 08/18/2012   TRIG 133.0 08/18/2012   BNP (last 3 results) Recent Labs    02/13/19 1504 04/01/19 2157  BNP 223.8* 302.1*    ProBNP (last 3 results) No results for input(s): PROBNP in the last 8760 hours.   Diagnostic Studies/Procedures  TEE 8/3  markedly dilated LA, LV with EF 50%, septal-lateral dyssynchrony.  Normal RV systolic size/function.  Mechanical MV with mean gradient 5 and mild peri-valvular regurgitation. Mechanical AoV with mean gradient 23, appeared  to open well.   RHC 8/6:  RHC Procedural Findings: Hemodynamics (mmHg) RA mean 11 RV 46/6 PA 50/21, mean 33 PCWP mean 19 Oxygen saturations: PA 69% AO 100% Cardiac Output (Fick) 6.24  Cardiac Index (Fick) 3.32 PVR 2.2 WU PAPI 2.63 CVP/PCWP 0.59  Discharge Medications   Allergies as of 04/11/2019      Reactions   Feraheme [ferumoxytol] Shortness Of Breath   SOB/Tachycardia   Penicillins Hives, Rash   Has patient had a PCN reaction causing immediate rash, facial/tongue/throat swelling, SOB or lightheadedness with hypotension: Yes Has patient had a PCN reaction causing severe rash involving mucus membranes or skin necrosis: Yes Has patient had a PCN reaction that required hospitalization unsure Has patient had a PCN reaction occurring within the last 10 years: 2010-2012 If all of the above answers are "NO", then may proceed with Cephalosporin use.   Sulfamethoxazole-trimethoprim Other (See Comments)   CDIFF, when taking oral tablets   Tape Other (See Comments), Itching   Skin irritation      Medication List    STOP taking these medications   bumetanide 2 MG tablet Commonly known as: BUMEX   metolazone 5 MG tablet Commonly known as:  ZAROXOLYN     TAKE these medications   allopurinol 300 MG tablet Commonly known as: ZYLOPRIM Take 300 mg by mouth daily.   aspirin 81 MG EC tablet Take 1 tablet (81 mg total) by mouth daily.   gabapentin 300 MG capsule Commonly known as: NEURONTIN Take 300 mg by mouth 3 (three) times daily.   magnesium oxide 400 (241.3 Mg) MG tablet Commonly known as: MAG-OX Take 1 tablet (400 mg total) by mouth 2 (two) times daily. What changed: when to take this   methocarbamol 500 MG tablet Commonly known as: ROBAXIN Take 1 tablet (500 mg total) by mouth 3 (three) times daily.   metoprolol succinate 25 MG 24 hr tablet Commonly known as: TOPROL-XL Take 1 tablet (25 mg total) by mouth 2 (two) times daily. What changed: how much to take   midodrine 5 MG tablet Commonly known as: PROAMATINE Take 1 tablet (5 mg total) by mouth 3 (three) times daily with meals.   mupirocin ointment 2 % Commonly known as: BACTROBAN Apply 1 application topically 3 (three) times daily.   oxyCODONE 5 MG immediate release tablet Commonly known as: Oxy IR/ROXICODONE Take 1 tablet (5 mg total) by mouth every 8 (eight) hours as needed for severe pain. What changed: when to take this   potassium chloride SA 20 MEQ tablet Commonly known as: K-DUR Take 2 tablets (40 mEq total) by mouth 3 (three) times daily.   PRESCRIPTION MEDICATION See admin instructions. CPAP- At bedtime and during any time of rest   silver sulfADIAZINE 1 % cream Commonly known as: SILVADENE Apply 1 application topically daily as needed (for wound care- both legs).   spironolactone 25 MG tablet Commonly known as: ALDACTONE Take 1 tablet (25 mg total) by mouth daily.   torsemide 20 MG tablet Commonly known as: DEMADEX Take 3 tablets (60 mg total) by mouth 2 (two) times daily.   traZODone 100 MG tablet Commonly known as: DESYREL Take 100 mg by mouth at bedtime.   warfarin 5 MG tablet Commonly known as: COUMADIN Take as directed.  If you are unsure how to take this medication, talk to your nurse or doctor. Original instructions: 5 mg daily, and 2.5 mg on Mondays What changed:   how much to take  how to take  this  additional instructions       Disposition   The patient will be discharged in stable condition to home. Discharge Instructions    Diet - low sodium heart healthy   Complete by: As directed    Heart Failure patients record your daily weight using the same scale at the same time of day   Complete by: As directed    Increase activity slowly   Complete by: As directed      Follow-up Information    [facility] Follow up on 04/11/2019.   Specialty: Cardiology Contact information: [address] [id number] [place] [phone]            Duration of Discharge Encounter: Greater than 35 minutes   Signed, Darrick Grinder NP-C  04/11/2019, 8:29 AM

## 2019-04-11 NOTE — TOC Transition Note (Signed)
Transition of Care [facility]) - CM/SW Discharge Note   Patient Details  Name: Tristain Daily MRN: [id number] Date of Birth: [birth year]  [facility]) CM/SW Contact:  Zenon Mayo, RN Phone Number: 04/11/2019, 9:18 AM   Clinical Narrative:    Patient is for dc today, he would like to continue with [facility] for [place], will need INR on 8/14.  NCM contacted Butch Penny with [facility] to notify of dc today and INR check.  Soc will begin 24-48 hrs post dc. Patient states he will need a cab, his wife is blind and does not drive.  NCM will see about getting him a cab to go home.    Final next level of care: [place] Barriers to Discharge: No Barriers Identified   Patient Goals and CMS Choice Patient states their goals for this hospitalization and ongoing recovery are:: to go home CMS Medicare.gov Compare Post Acute Care list provided to:: Patient Choice offered to / list presented to : Patient  Discharge Placement                       Discharge Plan and Services   Discharge Planning Services: CM Consult Post Acute Care Choice: Resumption of Svcs/PTA Provider          DME Arranged: (NA)         HH Arranged: RN, PT HH Agency: [place] Date [place]: 04/11/19 Time [place]: [phone] Representative spoke with at [place]: [place] ([place]) Interventions     Readmission Risk Interventions No flowsheet data found.

## 2019-04-11 NOTE — Progress Notes (Signed)
[id number] Pt just got PICC out and on bedrest. Gave pt CHF booklet and reviewed zones. Pt stated he weighs daily. Discussed 2000 mg sodium restriction, 1500 ml FR and low sodium and diabetic diets given. Encouraged pt to walk several times a day going a little farther as tolerated. Pt is very thankful to be feeling better and excited to go home. Graylon Good RN BSN 04/11/2019 9:53 AM

## 2019-04-12 ENCOUNTER — Ambulatory Visit (INDEPENDENT_AMBULATORY_CARE_PROVIDER_SITE_OTHER): Payer: Medicare HMO | Admitting: Pharmacist

## 2019-04-12 DIAGNOSIS — Z5181 Encounter for therapeutic drug level monitoring: Secondary | ICD-10-CM

## 2019-04-12 DIAGNOSIS — Z954 Presence of other heart-valve replacement: Secondary | ICD-10-CM | POA: Diagnosis not present

## 2019-04-12 DIAGNOSIS — I482 Chronic atrial fibrillation, unspecified: Secondary | ICD-10-CM

## 2019-04-12 LAB — POCT INR: INR: 3.7 — AB (ref 2.0–3.0)

## 2019-04-18 ENCOUNTER — Telehealth (HOSPITAL_COMMUNITY): Payer: Self-pay | Admitting: Cardiology

## 2019-04-18 NOTE — Telephone Encounter (Signed)
LATE ENTRY: HH/patient called 04/15/19 with concerns of increased weight (up 5 lbs since discharge) dry cough, LE edema, and mild SOB. HHRN reports lungs are clear and patient has HFU with CHF clinic 8/18   Above reviewed with [name],NP Ok to increase Torsemide to 80 mg BID x 2 days and be sure to keep follow up  Patient/HHRN aware and voiced understanding

## 2019-04-19 ENCOUNTER — Ambulatory Visit (INDEPENDENT_AMBULATORY_CARE_PROVIDER_SITE_OTHER): Payer: Medicare HMO | Admitting: Pharmacist

## 2019-04-19 ENCOUNTER — Other Ambulatory Visit: Payer: Self-pay

## 2019-04-19 ENCOUNTER — Other Ambulatory Visit (HOSPITAL_COMMUNITY): Payer: Self-pay | Admitting: Internal Medicine

## 2019-04-19 ENCOUNTER — Encounter (HOSPITAL_COMMUNITY): Payer: Self-pay

## 2019-04-19 ENCOUNTER — Ambulatory Visit (HOSPITAL_COMMUNITY)
Admit: 2019-04-19 | Discharge: 2019-04-19 | Disposition: A | Discharge status: Home / Self Care | Payer: Medicare HMO | Source: Ambulatory Visit | Attending: Internal Medicine | Admitting: Internal Medicine

## 2019-04-19 VITALS — BP 108/65 | HR 66 | Wt 180.2 lb

## 2019-04-19 DIAGNOSIS — R0602 Shortness of breath: Secondary | ICD-10-CM | POA: Diagnosis not present

## 2019-04-19 DIAGNOSIS — Z7982 Long term (current) use of aspirin: Secondary | ICD-10-CM | POA: Insufficient documentation

## 2019-04-19 DIAGNOSIS — Z954 Presence of other heart-valve replacement: Secondary | ICD-10-CM

## 2019-04-19 DIAGNOSIS — Z560 Unemployment, unspecified: Secondary | ICD-10-CM | POA: Diagnosis not present

## 2019-04-19 DIAGNOSIS — E1151 Type 2 diabetes mellitus with diabetic peripheral angiopathy without gangrene: Secondary | ICD-10-CM | POA: Diagnosis not present

## 2019-04-19 DIAGNOSIS — K449 Diaphragmatic hernia without obstruction or gangrene: Secondary | ICD-10-CM | POA: Diagnosis not present

## 2019-04-19 DIAGNOSIS — M7989 Other specified soft tissue disorders: Secondary | ICD-10-CM

## 2019-04-19 DIAGNOSIS — Z87891 Personal history of nicotine dependence: Secondary | ICD-10-CM | POA: Insufficient documentation

## 2019-04-19 DIAGNOSIS — Z952 Presence of prosthetic heart valve: Secondary | ICD-10-CM | POA: Insufficient documentation

## 2019-04-19 DIAGNOSIS — K219 Gastro-esophageal reflux disease without esophagitis: Secondary | ICD-10-CM | POA: Diagnosis not present

## 2019-04-19 DIAGNOSIS — M1990 Unspecified osteoarthritis, unspecified site: Secondary | ICD-10-CM | POA: Insufficient documentation

## 2019-04-19 DIAGNOSIS — Z7901 Long term (current) use of anticoagulants: Secondary | ICD-10-CM | POA: Insufficient documentation

## 2019-04-19 DIAGNOSIS — I340 Nonrheumatic mitral (valve) insufficiency: Secondary | ICD-10-CM | POA: Insufficient documentation

## 2019-04-19 DIAGNOSIS — D649 Anemia, unspecified: Secondary | ICD-10-CM | POA: Insufficient documentation

## 2019-04-19 DIAGNOSIS — Z8249 Family history of ischemic heart disease and other diseases of the circulatory system: Secondary | ICD-10-CM | POA: Insufficient documentation

## 2019-04-19 DIAGNOSIS — I2510 Atherosclerotic heart disease of native coronary artery without angina pectoris: Secondary | ICD-10-CM | POA: Diagnosis not present

## 2019-04-19 DIAGNOSIS — M109 Gout, unspecified: Secondary | ICD-10-CM | POA: Insufficient documentation

## 2019-04-19 DIAGNOSIS — I739 Peripheral vascular disease, unspecified: Secondary | ICD-10-CM | POA: Insufficient documentation

## 2019-04-19 DIAGNOSIS — Z5181 Encounter for therapeutic drug level monitoring: Secondary | ICD-10-CM | POA: Diagnosis not present

## 2019-04-19 DIAGNOSIS — I252 Old myocardial infarction: Secondary | ICD-10-CM | POA: Insufficient documentation

## 2019-04-19 DIAGNOSIS — I110 Hypertensive heart disease with heart failure: Secondary | ICD-10-CM | POA: Diagnosis not present

## 2019-04-19 DIAGNOSIS — G4733 Obstructive sleep apnea (adult) (pediatric): Secondary | ICD-10-CM | POA: Insufficient documentation

## 2019-04-19 DIAGNOSIS — I482 Chronic atrial fibrillation, unspecified: Secondary | ICD-10-CM

## 2019-04-19 DIAGNOSIS — I5032 Chronic diastolic (congestive) heart failure: Secondary | ICD-10-CM | POA: Diagnosis present

## 2019-04-19 DIAGNOSIS — E785 Hyperlipidemia, unspecified: Secondary | ICD-10-CM | POA: Diagnosis not present

## 2019-04-19 DIAGNOSIS — I447 Left bundle-branch block, unspecified: Secondary | ICD-10-CM | POA: Diagnosis not present

## 2019-04-19 DIAGNOSIS — Z8673 Personal history of transient ischemic attack (TIA), and cerebral infarction without residual deficits: Secondary | ICD-10-CM | POA: Diagnosis not present

## 2019-04-19 DIAGNOSIS — I5033 Acute on chronic diastolic (congestive) heart failure: Secondary | ICD-10-CM | POA: Diagnosis not present

## 2019-04-19 DIAGNOSIS — Z79899 Other long term (current) drug therapy: Secondary | ICD-10-CM | POA: Diagnosis not present

## 2019-04-19 LAB — CBC
HCT: 36.4 % — ABNORMAL LOW (ref 39.0–52.0)
Hemoglobin: 10.5 g/dL — ABNORMAL LOW (ref 13.0–17.0)
MCH: 22.8 pg — ABNORMAL LOW (ref 26.0–34.0)
MCHC: 28.8 g/dL — ABNORMAL LOW (ref 30.0–36.0)
MCV: 79 fL — ABNORMAL LOW (ref 80.0–100.0)
Platelets: 226 10*3/uL (ref 150–400)
RBC: 4.61 MIL/uL (ref 4.22–5.81)
RDW: 25.4 % — ABNORMAL HIGH (ref 11.5–15.5)
WBC: 9.8 10*3/uL (ref 4.0–10.5)
nRBC: 0 % (ref 0.0–0.2)

## 2019-04-19 LAB — BASIC METABOLIC PANEL
Anion gap: 8 (ref 5–15)
BUN: 29 mg/dL — ABNORMAL HIGH (ref 6–20)
CO2: 31 mmol/L (ref 22–32)
Calcium: 8.4 mg/dL — ABNORMAL LOW (ref 8.9–10.3)
Chloride: 92 mmol/L — ABNORMAL LOW (ref 98–111)
Creatinine, Ser: 0.99 mg/dL (ref 0.61–1.24)
GFR calc Af Amer: >60 mL/min (ref >60)
GFR calc non Af Amer: >60 mL/min (ref >60)
Glucose, Bld: 80 mg/dL (ref 70–99)
Potassium: 4.7 mmol/L (ref 3.5–5.1)
Sodium: 131 mmol/L — ABNORMAL LOW (ref 135–145)

## 2019-04-19 LAB — BRAIN NATRIURETIC PEPTIDE: B Natriuretic Peptide: 195.6 pg/mL — ABNORMAL HIGH (ref 0.0–100.0)

## 2019-04-19 LAB — POCT INR: INR: 2.4 (ref 2.0–3.0)

## 2019-04-19 MED ORDER — METOLAZONE 5 MG PO TABS: 5.0000 mg | ORAL_TABLET | ORAL | 0 refills | Status: DC

## 2019-04-19 MED ORDER — TORSEMIDE 20 MG PO TABS: 80.0000 mg | ORAL_TABLET | Freq: Two times a day (BID) | ORAL | 11 refills | Status: DC

## 2019-04-19 NOTE — Patient Instructions (Signed)
Spoke with Amy LPN from [facility] pt to take 7.5mg  tonight then continue taking 1 tablet daily. Recheck INR in 1 weeks. Coumadin Clinic [phone]

## 2019-04-19 NOTE — Patient Instructions (Signed)
Labs were done today. We will call you with any ABNORMAL results. No news is good news!  BEGIN taking Torsemide 80 mg (4 tabs) in the AM and 80 mg (4 tabs) in the PM.  Take Metolazone 5 mg (1 tab) today. Then every Wednesday, beginning next week.   You will need to take extra Potassium 20 mEq each time you take Metolazone.   Your physician recommends that you schedule a follow-up appointment in: 1 day, we will evaluate you and administer IV lasix.  Your physician recommends that you schedule a follow-up appointment in: 1 week.

## 2019-04-19 NOTE — Progress Notes (Signed)
PCP: Primary Cardiologist: Dr [name]   HPI: Lance Cohenis a 58 y.o.with history of Bentall procedure with St Jude mechanical aortic alve and conduit in 1988. 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 This procedure was complicated by ARDS and prolonged time on the vent. He had SVG-RCA with original surgery. Last cath in 4/16 prior to MV surgery showed patent native coronaries, SVG-RCA likely occluded (never identified). He has had chronic atrial fibrillation and has history of CVA.   Initially after MV surgery, he did fairly well. 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 He had reasonable quality of life on po Lasix with occasional episodes of fluid retention that were manageable. However, starting about 3-4 months ago, he began to decompensate and has not responded well to outpatient medication adjustment. He was admitted in 6/20 and was diuresed with IV Lasix, he lost 20 lbs. However, after discharge, po diuretics were not effective at managing the re-development of volume overload. He was initially on high dose Lasix, then transitioned over to Bumex which was most recently increased to 4 mg bid. Prior to admission, he was taking metolazone 5 mg bid as well. Despite this (and several doses of IV Lasix at home), weight has gone back up 20 lbs and he is short of breath walking around the house.   Admitted 04/01/19 with marked volume overload. Diuresed with lasix drip and later transitioned to torsemide 60 mg twice a day. Hospital course complicated by severe allergic reaction to feraheme. He was placed on bipap, solumedrol, Neo and given midodrine. Discharge weight was 165.5 pounds.   Since discharge his weight has been trending up 5 pounds. Last week he was instructed to increase torsemide to 80 mg twice a day due to weight gain.   Today he returns for post hospital follow up. Overall feeling fair. Lower extremity edema is  getting worse. Having some sob with exertion.  Denies PND/Orthopnea. Appetite ok.Following low salt diet.  No fever or chills. Weight at home trending up from 165--->174  pounds. Taking all medications. Follow[facility] by [facility] and HHPT. Ambulates with walker. Lives with his wife and she is legally blind.  TEE 8/3 markedly dilated LA, LV with EF 50%, septal-lateral dyssynchrony. Normal RV systolic size/function. Mechanical MV with mean gradient 5 and mild peri-valvular regurgitation. Mechanical AoV with mean gradient 23, appeared to open well.   RHC 8/6:  RHC Procedural Findings: Hemodynamics (mmHg) RA mean 11 RV 46/6 PA 50/21, mean 33 PCWP mean 19 Oxygen saturations: PA 69% AO 100% Cardiac Output (Fick) 6.24  Cardiac Index (Fick) 3.32 PVR 2.2 WU PAPI 2.63 CVP/PCWP 0.59   ROS: All systems negative except as listed in HPI, PMH and Problem List.  SH:  Social History   Socioeconomic History   Marital status: Married    Spouse name: Not on file   Number of children: 0   Years of education: Not on file   Highest education level: Not on file  Occupational History   Occupation: Disabled Environmental education officer: UNEMPLOYED  Social Designer, fashion/clothing strain: Not on file   Food insecurity    Worry: Not on file    Inability: Not on file   Transportation needs    Medical: Not on file    Non-medical: Not on file  Tobacco Use   Smoking status: Former Smoker    Types: Cigars   Smokeless tobacco: Never Used  Tobacco comment: about 3 yearly- cigar  Substance and Sexual Activity   Alcohol use: Yes    Alcohol/week: 0.0 standard drinks    Comment: social   Drug use: No   Sexual activity: Not on file  Lifestyle   Physical activity    Days per week: Not on file    Minutes per session: Not on file   Stress: Not on file  Relationships   Social connections    Talks on phone: Not on file    Gets together: Not on file    Attends religious service: Not  on file    Active member of club or organization: Not on file    Attends meetings of clubs or organizations: Not on file    Relationship status: Not on file   Intimate partner violence    Fear of current or ex partner: Not on file    Emotionally abused: Not on file    Physically abused: Not on file    Forced sexual activity: Not on file  Other Topics Concern   Not on file  Social History Narrative   Not on file    FH:  Family History  Problem Relation Age of Onset   Heart disease Mother    Throat cancer Mother    Other Mother        bowel obstruction   Diabetes Father    Stroke Other    Hypertension Other    Cancer Maternal Aunt        lung, brain   Cancer Maternal Uncle        brain aneurysm   Colon cancer Neg Hx     Past Medical History:  Diagnosis Date   Acute on chronic diastolic CHF (congestive heart failure) ([place]) 08/27/2016   Anemia    Anxiety    ARDS (adult respiratory distress syndrome) ([place]) 01/27/2015   Arthritis    Ascending aortic dissection ([place]) 07/14/2008   Localized dissection of ascending aorta noted on CTA in 2009 and stable on CTA in 2011   Asymptomatic chronic venous hypertension 01/15/2010   Overview:  Overview:  Qualifier: Diagnosis of  By: [name] MD, [place]   Last Assessment & Plan:  I suggested he go to a vein clinic to see if he could be qualified compression hose of some type, since he is not a surgical candidate according to the vascular surgeons.    Atrial fibrillation (HCC)    chronic persistent   Bell's palsy    C. difficile diarrhea    CAD (coronary artery disease)    Old scar inferior wall myoview, 10/2009 EF 52%.  He did have previous SVG to RCA but no obstructive disease noted on his most recent cath.  SVG occluded.    Cellulitis 04/02/2014   Cerebral artery occlusion with cerebral infarction ([place]) 10/08/2011   Overview:  Overview:  And hx of TIA prior to CABG, all thought due to systemic emboli prior to  coumadin    Chronic diastolic heart failure ([place]) 03/01/2015   a. 12/2015: echo showing a preserved EF of 65-70%, moderate AS, and moderate TR.    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 No high-grade dysplasia    HTN (hypertension) 02/23/2014   Hyperlipidemia    Hypertension    Hypogonadism male 04/08/2012   Impaired glucose tolerance 10/08/2011   Morbid obesity ([place]) 04/02/2014   Myocardial infarction [facility]) age 18   OSA (obstructive sleep apnea)    CPAP   Peripheral vascular disease (HCC)    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 2/50/5397   33 mm St Jude bileaflet mechanical valve placed via right mini thoracotomy approach   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    1988 - Dr Blase Mess at [facility] in [place], [place]   Severe mitral regurgitation 10/11/2014   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 He had surgical resection and rad tx's.    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     aspirin EC 81 MG EC tablet Take 1 tablet (81 mg total) by mouth daily. 30 tablet 0   gabapentin (NEURONTIN) 300 MG capsule Take 300 mg by mouth 3 (three) times daily.     magnesium oxide (MAG-OX) 400 (241.3 MG) MG tablet Take 1 tablet (400 mg total) by mouth 2 (two) times daily. (Patient taking differently: Take 400 mg by mouth 3 (three) times daily. ) 180 tablet 3   methocarbamol (ROBAXIN) 500 MG tablet Take 1 tablet (500 mg total) by mouth 3 (three) times daily. 270 tablet 0   metoprolol succinate (TOPROL-XL) 25 MG 24 hr tablet Take 1 tablet (25 mg total) by mouth 2 (two) times daily. 30 tablet 6   midodrine (PROAMATINE) 5 MG tablet Take 1 tablet (5 mg total) by mouth 3 (three) times daily with meals. 90 tablet 6   mupirocin ointment (BACTROBAN) 2 % Apply 1 application topically 3 (three) times daily.      oxyCODONE (OXY IR/ROXICODONE) 5 MG immediate release tablet Take 1 tablet (5 mg total) by mouth every 8 (eight) hours as needed for severe pain. (Patient taking differently: Take 5 mg by mouth 3 (three) times daily. ) 90 tablet 0   potassium chloride SA (K-DUR) 20 MEQ tablet Take 2 tablets (40 mEq total) by mouth 3 (three) times daily. 180 tablet 0   PRESCRIPTION MEDICATION See admin instructions. CPAP- At bedtime and during any time of rest     silver sulfADIAZINE (SILVADENE) 1 % cream Apply 1 application topically daily as needed (for wound care- both legs).      spironolactone (ALDACTONE) 25 MG tablet Take 1 tablet (25 mg total) by mouth daily. 30 tablet 6   torsemide (DEMADEX) 20 MG tablet Take 3  tablets (60 mg total) by mouth 2 (two) times daily. 180 tablet 6   traZODone (DESYREL) 100 MG tablet Take 100 mg by mouth at bedtime.      warfarin (COUMADIN) 5 MG tablet 5 mg daily, and 2.5 mg on  Mondays (Patient taking differently: Take 5 mg by mouth. ) 135 tablet 0   No current facility-administered medications for this encounter.     Vitals:   04/19/19 1226  BP: 108/65  Pulse: 66  SpO2: 97%  Weight: 81.7 kg (180 lb 3.2 oz)    Wt Readings from Last 3 Encounters:  04/19/19 81.7 kg (180 lb 3.2 oz)  04/11/19 75.1 kg (165 lb 9.1 oz)  02/26/19 90 kg (198 lb 8 oz)     PHYSICAL EXAM: General: Appears chronically ill.  No resp difficulty. Ambulated in the clinic with a walker.  HEENT: normal Neck: supple. JVP to jaw . Carotids 2+ bilaterally; no bruits. No lymphadenopathy or thryomegaly appreciated. Cor: PMI normal. Irregular rate & rhythm. No rubs, gallops or murmurs. Lungs: clear Abdomen: soft, nontender, nondistended. No hepatosplenomegaly. No bruits or masses. Good bowel sounds. Extremities: no cyanosis, clubbing, rash, R and LLE 2-3+edema Neuro: alert & orientedx3, cranial nerves grossly intact. Moves all 4 extremities w/o difficulty. Affect pleasant.     ASSESSMENT & PLAN: 1. Chronic diastolic CHF: Last echo in 6/20 showed preserved EF 55-60% with septal bounce from LBBB. Mean gradient was elevated across aortic valve at 30 mmHg (though not markedly elevated), and there was suggestion of mild peri-valvular regurgitation at mitral position. On TEE this admission, EF 50% with septal-lateral dyssynchrony (c/w LBBB), normal-appearing RV, mechanical MV with mild peri-valvular regurgitation and mean gradient 5, mechanical AoV with mean gradient 23 mmHg. He has significantly decompensated over the last several months, now with marked diuretic resistance. His atrial fibrillation is not new. Dysfunction of his mechanical valves does not appear to explain his worsening CHF. 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 [place] 8/6 showed mildly elevated filling pressures.  NYHA III. Volume status elevated. Continue torsemide 80 mg twice a day and he will take 5  mg metolazone today. He was offered IV lasix but wants to come back tomorrow.    -Continue spironolactone. - He is on midodrine currently to maintain BP, will continue for now.  - Check BMET  2. Valvular heart disease: Patient has mechanical mitral and aortic valves. TEE showed mechanical MV with mean gradient 5 mmHg, mild peri-valvular regurgitation. The aortic valve appeared to open reasonably well but mean gradient was 23 mmHg, ?component of mild patient-prosthesis mismatch. No indication for any sort of intervention.  - Continue ASA 81 and warfarin with INR goal 2.5-3.5.   - He will continue home coumadin regimen. 3. Atrial fibrillation: Chronic. Long-standing atrial fibrillation with severely dilated atria, unlikely to have a successful cardioversion. Rate is reasonably controlled now off amiodarone.  -Continue Toprol XL at current dose.  4. CAD: Last cath in 4/16 with patent native coronaries, probably occluded SVG-RCA. Doubt CAD as cause of his decompensation.  - No chest pain.  5. Type 2 diabetes: SSI.  6. OSA: CPAP. 7. Anemia, iron-deficient: received feraheme 02/22/2019 and again on 04/05/19 but had severe reaction to feraheme. Given 120 mg solumedrol, benadryl, and placed on Bipap. Now resolved.  - Check CBC today.  8. Deconditioning: PT/OT follow up. HH will continue follow. [facility] will resume care.    Follow up tomorrow for IV lasix. He is going to be hard to  manage in the community until we can find the right diuretic dose. Check CBC, BMET, and BNP today.  Greater than 50% of the (total minutes 40 ) visit spent in counseling/coordination of care regarding heart failure, diuretics, and diet.    Shelitha Magley NP-C   3:09 PM

## 2019-04-20 ENCOUNTER — Encounter (HOSPITAL_COMMUNITY): Payer: Self-pay

## 2019-04-20 ENCOUNTER — Ambulatory Visit (HOSPITAL_COMMUNITY)
Admission: RE | Admit: 2019-04-20 | Discharge: 2019-04-20 | Disposition: A | Discharge status: Home / Self Care | Payer: Medicare HMO | Source: Ambulatory Visit | Attending: Internal Medicine | Admitting: Internal Medicine

## 2019-04-20 VITALS — BP 98/64 | HR 78 | Wt 175.6 lb

## 2019-04-20 DIAGNOSIS — D509 Iron deficiency anemia, unspecified: Secondary | ICD-10-CM | POA: Diagnosis not present

## 2019-04-20 DIAGNOSIS — E1151 Type 2 diabetes mellitus with diabetic peripheral angiopathy without gangrene: Secondary | ICD-10-CM | POA: Diagnosis not present

## 2019-04-20 DIAGNOSIS — K219 Gastro-esophageal reflux disease without esophagitis: Secondary | ICD-10-CM | POA: Diagnosis not present

## 2019-04-20 DIAGNOSIS — E785 Hyperlipidemia, unspecified: Secondary | ICD-10-CM | POA: Diagnosis not present

## 2019-04-20 DIAGNOSIS — Z952 Presence of prosthetic heart valve: Secondary | ICD-10-CM | POA: Insufficient documentation

## 2019-04-20 DIAGNOSIS — Z7901 Long term (current) use of anticoagulants: Secondary | ICD-10-CM | POA: Diagnosis not present

## 2019-04-20 DIAGNOSIS — I5032 Chronic diastolic (congestive) heart failure: Secondary | ICD-10-CM | POA: Insufficient documentation

## 2019-04-20 DIAGNOSIS — I447 Left bundle-branch block, unspecified: Secondary | ICD-10-CM | POA: Insufficient documentation

## 2019-04-20 DIAGNOSIS — I110 Hypertensive heart disease with heart failure: Secondary | ICD-10-CM | POA: Diagnosis not present

## 2019-04-20 DIAGNOSIS — I2510 Atherosclerotic heart disease of native coronary artery without angina pectoris: Secondary | ICD-10-CM | POA: Diagnosis not present

## 2019-04-20 DIAGNOSIS — Z79899 Other long term (current) drug therapy: Secondary | ICD-10-CM | POA: Insufficient documentation

## 2019-04-20 DIAGNOSIS — M7989 Other specified soft tissue disorders: Secondary | ICD-10-CM

## 2019-04-20 DIAGNOSIS — Z7982 Long term (current) use of aspirin: Secondary | ICD-10-CM | POA: Insufficient documentation

## 2019-04-20 DIAGNOSIS — M109 Gout, unspecified: Secondary | ICD-10-CM | POA: Diagnosis not present

## 2019-04-20 DIAGNOSIS — I509 Heart failure, unspecified: Secondary | ICD-10-CM | POA: Diagnosis not present

## 2019-04-20 DIAGNOSIS — Z9989 Dependence on other enabling machines and devices: Secondary | ICD-10-CM

## 2019-04-20 DIAGNOSIS — G4733 Obstructive sleep apnea (adult) (pediatric): Secondary | ICD-10-CM | POA: Diagnosis not present

## 2019-04-20 DIAGNOSIS — M1990 Unspecified osteoarthritis, unspecified site: Secondary | ICD-10-CM | POA: Diagnosis not present

## 2019-04-20 DIAGNOSIS — I5033 Acute on chronic diastolic (congestive) heart failure: Secondary | ICD-10-CM | POA: Diagnosis not present

## 2019-04-20 DIAGNOSIS — I252 Old myocardial infarction: Secondary | ICD-10-CM | POA: Diagnosis not present

## 2019-04-20 DIAGNOSIS — Z8547 Personal history of malignant neoplasm of testis: Secondary | ICD-10-CM | POA: Diagnosis not present

## 2019-04-20 DIAGNOSIS — Z8673 Personal history of transient ischemic attack (TIA), and cerebral infarction without residual deficits: Secondary | ICD-10-CM | POA: Insufficient documentation

## 2019-04-20 DIAGNOSIS — Z87891 Personal history of nicotine dependence: Secondary | ICD-10-CM | POA: Insufficient documentation

## 2019-04-20 DIAGNOSIS — Z8719 Personal history of other diseases of the digestive system: Secondary | ICD-10-CM | POA: Diagnosis not present

## 2019-04-20 DIAGNOSIS — I482 Chronic atrial fibrillation, unspecified: Secondary | ICD-10-CM | POA: Diagnosis not present

## 2019-04-20 DIAGNOSIS — I10 Essential (primary) hypertension: Secondary | ICD-10-CM

## 2019-04-20 MED ORDER — FUROSEMIDE 10 MG/ML IJ SOLN
80.0000 mg | Freq: Once | INTRAMUSCULAR | Status: AC
  Administered 2019-04-20: 80 mg via INTRAVENOUS

## 2019-04-20 MED ORDER — FUROSEMIDE 10 MG/ML IJ SOLN: 80.0000 mg | Freq: Once | INTRAMUSCULAR | 0 refills | Status: DC

## 2019-04-20 NOTE — Progress Notes (Signed)
PCP: Primary Cardiologist: Dr [name]   HPI: Lance Cook a 58 y.o.with history of Bentall procedure with St Jude mechanical aortic alve and conduit in 1988. 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 This procedure was complicated by ARDS and prolonged time on the vent. He had SVG-RCA with original surgery. Last cath in 4/16 prior to MV surgery showed patent native coronaries, SVG-RCA likely occluded (never identified). He has had chronic atrial fibrillation and has history of CVA.   Initially after MV surgery, he did fairly well. 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 He had reasonable quality of life on po Lasix with occasional episodes of fluid retention that were manageable. However, starting about 3-4 months ago, he began to decompensate and has not responded well to outpatient medication adjustment. He was admitted in 6/20 and was diuresed with IV Lasix, he lost 20 lbs. However, after discharge, po diuretics were not effective at managing the re-development of volume overload. He was initially on high dose Lasix, then transitioned over to Bumex which was most recently increased to 4 mg bid. Prior to admission, he was taking metolazone 5 mg bid as well. Despite this (and several doses of IV Lasix at home), weight has gone back up 20 lbs and he is short of breath walking around the house.   Admitted 04/01/19 with marked volume overload. Diuresed with lasix drip and later transitioned to torsemide 60 mg twice a day. Hospital course complicated by severe allergic reaction to feraheme. He was placed on bipap, solumedrol, Neo and given midodrine. Discharge weight was 165.5 pounds.   Since discharge his weight has been trending up 5 pounds. Last week he was instructed to increase torsemide to 80 mg twice a day due to weight gain.   Today he returns for HF follow up. Yesterday he was seen in the clinic and was volume  overloaded. He was continued on torsemide 80/80 and he was instructed to take 5 mg metolazone. Overall feeling much better. SOB with exertion but improved.Denies PND/Orthopnea. Appetite ok. No fever or chills. Weight at home trending down 174>170 pounds. Taking all medications.   TEE 8/3 markedly dilated LA, LV with EF 50%, septal-lateral dyssynchrony. Normal RV systolic size/function. Mechanical MV with mean gradient 5 and mild peri-valvular regurgitation. Mechanical AoV with mean gradient 23, appeared to open well.   RHC 8/6:  RHC Procedural Findings: Hemodynamics (mmHg) RA mean 11 RV 46/6 PA 50/21, mean 33 PCWP mean 19 Oxygen saturations: PA 69% AO 100% Cardiac Output (Fick) 6.24  Cardiac Index (Fick) 3.32 PVR 2.2 WU PAPI 2.63 CVP/PCWP 0.59   ROS: All systems negative except as listed in HPI, PMH and Problem List.  SH:  Social History   Socioeconomic History   Marital status: Married    Spouse name: Not on file   Number of children: 0   Years of education: Not on file   Highest education level: Not on file  Occupational History   Occupation: Disabled Environmental education officer: UNEMPLOYED  Social Designer, fashion/clothing strain: Not on file   Food insecurity    Worry: Not on file    Inability: Not on file   Transportation needs    Medical: Not on file    Non-medical: Not on file  Tobacco Use   Smoking status: Former Smoker    Types: Cigars   Smokeless tobacco: Never Used   Tobacco comment: about 3  yearly- cigar  Substance and Sexual Activity   Alcohol use: Yes    Alcohol/week: 0.0 standard drinks    Comment: social   Drug use: No   Sexual activity: Not on file  Lifestyle   Physical activity    Days per week: Not on file    Minutes per session: Not on file   Stress: Not on file  Relationships   Social connections    Talks on phone: Not on file    Gets together: Not on file    Attends religious service: Not on file    Active member of  club or organization: Not on file    Attends meetings of clubs or organizations: Not on file    Relationship status: Not on file   Intimate partner violence    Fear of current or ex partner: Not on file    Emotionally abused: Not on file    Physically abused: Not on file    Forced sexual activity: Not on file  Other Topics Concern   Not on file  Social History Narrative   Not on file    FH:  Family History  Problem Relation Age of Onset   Heart disease Mother    Throat cancer Mother    Other Mother        bowel obstruction   Diabetes Father    Stroke Other    Hypertension Other    Cancer Maternal Aunt        lung, brain   Cancer Maternal Uncle        brain aneurysm   Colon cancer Neg Hx     Past Medical History:  Diagnosis Date   Acute on chronic diastolic CHF (congestive heart failure) ([place]) 08/27/2016   Anemia    Anxiety    ARDS (adult respiratory distress syndrome) ([place]) 01/27/2015   Arthritis    Ascending aortic dissection ([place]) 07/14/2008   Localized dissection of ascending aorta noted on CTA in 2009 and stable on CTA in 2011   Asymptomatic chronic venous hypertension 01/15/2010   Overview:  Overview:  Qualifier: Diagnosis of  By: [name] MD, [place]   Last Assessment & Plan:  I suggested he go to a vein clinic to see if he could be qualified compression hose of some type, since he is not a surgical candidate according to the vascular surgeons.    Atrial fibrillation (HCC)    chronic persistent   Bell's palsy    C. difficile diarrhea    CAD (coronary artery disease)    Old scar inferior wall myoview, 10/2009 EF 52%.  He did have previous SVG to RCA but no obstructive disease noted on his most recent cath.  SVG occluded.    Cellulitis 04/02/2014   Cerebral artery occlusion with cerebral infarction ([place]) 10/08/2011   Overview:  Overview:  And hx of TIA prior to CABG, all thought due to systemic emboli prior to coumadin    Chronic diastolic  heart failure ([place]) 03/01/2015   a. 12/2015: echo showing a preserved EF of 65-70%, moderate AS, and moderate TR.    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 No high-grade dysplasia    HTN (hypertension) 02/23/2014   Hyperlipidemia    Hypertension    Hypogonadism male 04/08/2012   Impaired glucose tolerance 10/08/2011   Morbid obesity ([place]) 04/02/2014   Myocardial infarction [facility]) age 42   OSA (obstructive sleep apnea)    CPAP   Peripheral vascular disease (HCC)    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 9/32/6712   33 mm St Jude bileaflet mechanical valve placed via right mini thoracotomy approach   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    1988 - Dr Blase Mess at [facility] in [place], [place]   Severe mitral regurgitation 10/11/2014   Testicular cancer [facility])    He was 58 y/o. He had surgical resection and rad tx's.    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     aspirin EC 81 MG EC tablet Take 1 tablet (81 mg total) by mouth daily. 30 tablet 0   gabapentin (NEURONTIN) 300 MG capsule Take 300 mg by mouth 3 (three) times daily.     magnesium oxide (MAG-OX) 400 (241.3 MG) MG tablet Take 1 tablet (400 mg total) by mouth 2 (two) times daily. (Patient taking differently: Take 400 mg by mouth 3 (three) times daily. ) 180 tablet 3   methocarbamol (ROBAXIN) 500 MG tablet Take 1 tablet (500 mg total) by mouth 3 (three) times daily. 270 tablet 0   metolazone (ZAROXOLYN) 5 MG tablet Take 1 tablet (5 mg total) by mouth once a week. Please take every Wednesday 8 tablet 0   metoprolol succinate (TOPROL-XL) 25 MG 24 hr tablet Take 1 tablet (25 mg total) by mouth 2 (two) times daily. 30 tablet 6   midodrine (PROAMATINE) 5 MG tablet Take 1 tablet (5 mg total) by mouth 3 (three) times daily with meals. 90 tablet 6   mupirocin ointment (BACTROBAN) 2 % Apply 1 application topically 3 (three) times daily.      oxyCODONE (OXY IR/ROXICODONE) 5 MG immediate release tablet Take 1 tablet (5 mg total) by mouth every 8 (eight) hours as needed for severe pain. (Patient taking differently: Take 5 mg by mouth 3 (three) times daily. ) 90 tablet 0   potassium chloride SA (K-DUR) 20 MEQ tablet Take 2 tablets (40 mEq total) by mouth 3 (three) times daily. 180 tablet 0   PRESCRIPTION MEDICATION See admin instructions. CPAP- At bedtime and during any time of rest     silver sulfADIAZINE (SILVADENE) 1 % cream Apply 1 application topically daily as needed (for wound care- both legs).      spironolactone (ALDACTONE) 25 MG tablet Take  1 tablet (25 mg total) by mouth daily. 30 tablet 6   torsemide (DEMADEX) 20 MG tablet Take 4 tablets (80 mg total) by mouth 2 (two) times daily. 240 tablet 11   traZODone (DESYREL) 100 MG tablet Take 100  mg by mouth at bedtime.      warfarin (COUMADIN) 5 MG tablet 5 mg daily, and 2.5 mg on Mondays (Patient taking differently: Take 5 mg by mouth. ) 135 tablet 0   No current facility-administered medications for this encounter.     Vitals:   04/20/19 1012  BP: 98/64  Pulse: 78  SpO2: 98%  Weight: 79.7 kg (175 lb 9.6 oz)    Wt Readings from Last 3 Encounters:  04/20/19 79.7 kg (175 lb 9.6 oz)  04/19/19 81.7 kg (180 lb 3.2 oz)  04/11/19 75.1 kg (165 lb 9.1 oz)     PHYSICAL EXAM: General:  No resp difficulty. Ambulated in the clinic with a walker.  HEENT: normal Neck: supple. JVP 11-12.  Carotids 2+ bilat; no bruits. No lymphadenopathy or thryomegaly appreciated. Cor: PMI nondisplaced. Regular rate & rhythm. No rubs, gallops or murmurs. Lungs: clear Abdomen: soft, nontender, nondistended. No hepatosplenomegaly. No bruits or masses. Good bowel sounds. Extremities: no cyanosis, clubbing, rash, R and LLE 2+ edema. LLE wound.  Neuro: alert & orientedx3, cranial nerves grossly intact. moves all 4 extremities w/o difficulty. Affect pleasant   ASSESSMENT & PLAN: 1. Chronic diastolic CHF: Last echo in 6/20 showed preserved EF 55-60% with septal bounce from LBBB. Mean gradient was elevated across aortic valve at 30 mmHg (though not markedly elevated), and there was suggestion of mild peri-valvular regurgitation at mitral position. On TEE this admission, EF 50% with septal-lateral dyssynchrony (c/w LBBB), normal-appearing RV, mechanical MV with mild peri-valvular regurgitation and mean gradient 5, mechanical AoV with mean gradient 23 mmHg. He has significantly decompensated over the last several months, now with marked diuretic resistance. His atrial fibrillation is not new. Dysfunction of his mechanical valves does not appear to explain his worsening CHF. 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 [place] 8/6 showed mildly elevated filling pressures.  NYHA III. Volume  status elevated. Give 80 mg IV lasix in the clinic. He had good response with 750 cc urine.  - He will continue torsemide 80 mg twice a day then start metolazone weekly. Check BMET next week.    -Continue spironolactone. - He is on midodrine currently to maintain BP, will continue for now.  2. Valvular heart disease: Patient has mechanical mitral and aortic valves. TEE showed mechanical MV with mean gradient 5 mmHg, mild peri-valvular regurgitation. The aortic valve appeared to open reasonably well but mean gradient was 23 mmHg, ?component of mild patient-prosthesis mismatch. No indication for any sort of intervention.  - Continue ASA 81 and warfarin with INR goal 2.5-3.5.   - He will continue home coumadin regimen. 3. Atrial fibrillation: Chronic. Long-standing atrial fibrillation with severely dilated atria, unlikely to have a successful cardioversion. -Rate controlled.  -Continue Toprol XL at current dose.  4. CAD: Last cath in 4/16 with patent native coronaries, probably occluded SVG-RCA. Doubt CAD as cause of his decompensation.  - No chest pain.  5. Type 2 diabetes: SSI.  6. OSA: CPAP. 7. Anemia, iron-deficient: received feraheme 02/22/2019 and again on 04/05/19 but had severe reaction to feraheme.  Hgb stable on 04/19/19  8. Deconditioning: HH following.    Follow up next week to reassess volume status. He was monitored in the clinic >1  hour. Given 80 mg IV lasix with reasonable amount of urine.    Chandell Attridge NP-C   10:16 AM

## 2019-04-20 NOTE — Patient Instructions (Signed)
Today you were given 80 mg (60mL) of IV Lasix.   We will see you for your follow up appointment next week!

## 2019-04-25 ENCOUNTER — Other Ambulatory Visit: Payer: Self-pay

## 2019-04-25 ENCOUNTER — Encounter (HOSPITAL_COMMUNITY): Payer: Self-pay

## 2019-04-25 ENCOUNTER — Ambulatory Visit (HOSPITAL_COMMUNITY)
Admission: RE | Admit: 2019-04-25 | Discharge: 2019-04-25 | Disposition: A | Discharge status: Home / Self Care | Payer: Medicare HMO | Source: Ambulatory Visit | Attending: Cardiology | Admitting: Cardiology

## 2019-04-25 VITALS — BP 118/62 | HR 91 | Wt 181.2 lb

## 2019-04-25 DIAGNOSIS — Z801 Family history of malignant neoplasm of trachea, bronchus and lung: Secondary | ICD-10-CM | POA: Diagnosis not present

## 2019-04-25 DIAGNOSIS — I447 Left bundle-branch block, unspecified: Secondary | ICD-10-CM | POA: Diagnosis not present

## 2019-04-25 DIAGNOSIS — I482 Chronic atrial fibrillation, unspecified: Secondary | ICD-10-CM | POA: Diagnosis not present

## 2019-04-25 DIAGNOSIS — G4733 Obstructive sleep apnea (adult) (pediatric): Secondary | ICD-10-CM | POA: Diagnosis not present

## 2019-04-25 DIAGNOSIS — Z87891 Personal history of nicotine dependence: Secondary | ICD-10-CM | POA: Diagnosis not present

## 2019-04-25 DIAGNOSIS — D649 Anemia, unspecified: Secondary | ICD-10-CM | POA: Diagnosis not present

## 2019-04-25 DIAGNOSIS — Z7901 Long term (current) use of anticoagulants: Secondary | ICD-10-CM | POA: Diagnosis not present

## 2019-04-25 DIAGNOSIS — Z800 Family history of malignant neoplasm of digestive organs: Secondary | ICD-10-CM | POA: Insufficient documentation

## 2019-04-25 DIAGNOSIS — Z9989 Dependence on other enabling machines and devices: Secondary | ICD-10-CM

## 2019-04-25 DIAGNOSIS — Z6835 Body mass index (BMI) 35.0-35.9, adult: Secondary | ICD-10-CM | POA: Diagnosis not present

## 2019-04-25 DIAGNOSIS — Z8249 Family history of ischemic heart disease and other diseases of the circulatory system: Secondary | ICD-10-CM | POA: Insufficient documentation

## 2019-04-25 DIAGNOSIS — M1990 Unspecified osteoarthritis, unspecified site: Secondary | ICD-10-CM | POA: Insufficient documentation

## 2019-04-25 DIAGNOSIS — I2510 Atherosclerotic heart disease of native coronary artery without angina pectoris: Secondary | ICD-10-CM | POA: Insufficient documentation

## 2019-04-25 DIAGNOSIS — I110 Hypertensive heart disease with heart failure: Secondary | ICD-10-CM | POA: Diagnosis present

## 2019-04-25 DIAGNOSIS — Z8547 Personal history of malignant neoplasm of testis: Secondary | ICD-10-CM | POA: Insufficient documentation

## 2019-04-25 DIAGNOSIS — I5032 Chronic diastolic (congestive) heart failure: Secondary | ICD-10-CM

## 2019-04-25 DIAGNOSIS — Z8673 Personal history of transient ischemic attack (TIA), and cerebral infarction without residual deficits: Secondary | ICD-10-CM | POA: Insufficient documentation

## 2019-04-25 DIAGNOSIS — I5033 Acute on chronic diastolic (congestive) heart failure: Secondary | ICD-10-CM | POA: Diagnosis not present

## 2019-04-25 DIAGNOSIS — M7989 Other specified soft tissue disorders: Secondary | ICD-10-CM | POA: Diagnosis not present

## 2019-04-25 DIAGNOSIS — Z952 Presence of prosthetic heart valve: Secondary | ICD-10-CM | POA: Insufficient documentation

## 2019-04-25 DIAGNOSIS — M109 Gout, unspecified: Secondary | ICD-10-CM | POA: Diagnosis not present

## 2019-04-25 DIAGNOSIS — Z833 Family history of diabetes mellitus: Secondary | ICD-10-CM | POA: Insufficient documentation

## 2019-04-25 DIAGNOSIS — E1151 Type 2 diabetes mellitus with diabetic peripheral angiopathy without gangrene: Secondary | ICD-10-CM | POA: Diagnosis not present

## 2019-04-25 DIAGNOSIS — Z808 Family history of malignant neoplasm of other organs or systems: Secondary | ICD-10-CM | POA: Insufficient documentation

## 2019-04-25 DIAGNOSIS — Z79899 Other long term (current) drug therapy: Secondary | ICD-10-CM | POA: Insufficient documentation

## 2019-04-25 DIAGNOSIS — I252 Old myocardial infarction: Secondary | ICD-10-CM | POA: Diagnosis not present

## 2019-04-25 DIAGNOSIS — Z7982 Long term (current) use of aspirin: Secondary | ICD-10-CM | POA: Insufficient documentation

## 2019-04-25 DIAGNOSIS — I340 Nonrheumatic mitral (valve) insufficiency: Secondary | ICD-10-CM | POA: Diagnosis not present

## 2019-04-25 LAB — BASIC METABOLIC PANEL
Anion gap: 8 (ref 5–15)
BUN: 34 mg/dL — ABNORMAL HIGH (ref 6–20)
CO2: 33 mmol/L — ABNORMAL HIGH (ref 22–32)
Calcium: 8.3 mg/dL — ABNORMAL LOW (ref 8.9–10.3)
Chloride: 88 mmol/L — ABNORMAL LOW (ref 98–111)
Creatinine, Ser: 0.99 mg/dL (ref 0.61–1.24)
GFR calc Af Amer: >60 mL/min (ref >60)
GFR calc non Af Amer: >60 mL/min (ref >60)
Glucose, Bld: 85 mg/dL (ref 70–99)
Potassium: 4.1 mmol/L (ref 3.5–5.1)
Sodium: 129 mmol/L — ABNORMAL LOW (ref 135–145)

## 2019-04-25 LAB — BRAIN NATRIURETIC PEPTIDE: B Natriuretic Peptide: 194.9 pg/mL — ABNORMAL HIGH (ref 0.0–100.0)

## 2019-04-25 MED ORDER — METOLAZONE 5 MG PO TABS: ORAL_TABLET | ORAL | 0 refills | Status: DC

## 2019-04-25 NOTE — Patient Instructions (Addendum)
Labs done today. We will contact you only if your labs are abnormal.  Your provider request that you LIMIT FLUIDS to 1531ml a day.  INCREASE Metolazone 5mg  take one tablet every Monday and every Friday.  We [facility] to [facility]. They will contact you to schedule visits.   Your physician recommends that you schedule a follow-up appointment in: 2 weeks with Darrick Grinder, NP and 8 weeks with Dr.Mclean.   At the [facility], you and your health needs are our priority. As part of our continuing mission to provide you with exceptional heart care, we have created designated Provider Care Teams. These Care Teams include your primary Cardiologist (physician) and [facility] Practice Providers (APPs- Physician Assistants and Nurse Practitioners) who all work together to provide you with the care you need, when you need it.   You may see any of the following providers on your designated Care Team at your next follow up: Marland Kitchen Dr Glori Bickers . Dr Loralie Champagne . Darrick Grinder, NP   Please be sure to bring in all your medications bottles to every appointment.

## 2019-04-25 NOTE — Progress Notes (Signed)
PCP: Primary Cardiologist: Dr [name]   HPI: Lance Cohenis a 58 y.o.with history of Bentall procedure with St Jude mechanical aortic alve and conduit in 1988. 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 This procedure was complicated by ARDS and prolonged time on the vent. He had SVG-RCA with original surgery. Last cath in 4/16 prior to MV surgery showed patent native coronaries, SVG-RCA likely occluded (never identified). He has had chronic atrial fibrillation and has history of CVA.   Initially after MV surgery, he did fairly well. 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 He had reasonable quality of life on po Lasix with occasional episodes of fluid retention that were manageable. However, starting about 3-4 months ago, he began to decompensate and has not responded well to outpatient medication adjustment. He was admitted in 6/20 and was diuresed with IV Lasix, he lost 20 lbs. However, after discharge, po diuretics were not effective at managing the re-development of volume overload. He was initially on high dose Lasix, then transitioned over to Bumex which was most recently increased to 4 mg bid. Prior to admission, he was taking metolazone 5 mg bid as well. Despite this (and several doses of IV Lasix at home), weight has gone back up 20 lbs and he is short of breath walking around the house.   Admitted 04/01/19 with marked volume overload. Diuresed with lasix drip and later transitioned to torsemide 60 mg twice a day. Hospital course complicated by severe allergic reaction to feraheme. He was placed on bipap, solumedrol, Neo and given midodrine. Discharge weight was 165.5 pounds.   Since discharge his weight has been trending up 5 pounds. Last week he was instructed to increase torsemide to 80 mg twice a day due to weight gain.   Today he returns for HF follow up. Last week he was given 80 mg IV lasix for volume overload.  Overall feeling ok. Frustrated his weight is going up. SOB with exertion. Denies PND/Orthopnea. Appetite ok. Drinking extra fluid at home per his wife.  No fever or chills. Weight at home trending back up from 170-->175  pounds. Complaining of LLE wounds on his legs. His wife says he scratches his leg and picks a the scabs. Taking all medications. [facility].    TEE 8/3 markedly dilated LA, LV with EF 50%, septal-lateral dyssynchrony. Normal RV systolic size/function. Mechanical MV with mean gradient 5 and mild peri-valvular regurgitation. Mechanical AoV with mean gradient 23, appeared to open well.   RHC 8/6:  RHC Procedural Findings: Hemodynamics (mmHg) RA mean 11 RV 46/6 PA 50/21, mean 33 PCWP mean 19 Oxygen saturations: PA 69% AO 100% Cardiac Output (Fick) 6.24  Cardiac Index (Fick) 3.32 PVR 2.2 WU PAPI 2.63 CVP/PCWP 0.59   ROS: All systems negative except as listed in HPI, PMH and Problem List.  SH:  Social History   Socioeconomic History   Marital status: Married    Spouse name: Not on file   Number of children: 0   Years of education: Not on file   Highest education level: Not on file  Occupational History   Occupation: Disabled Environmental education officer: UNEMPLOYED  Social Needs   Financial resource strain: Not on file   Food insecurity    Worry: Not on file    Inability: Not on file   Transportation needs    Medical: Not on file    Non-medical: Not on file  Tobacco Use   Smoking status: Former Smoker    Types: Cigars   Smokeless tobacco: Never Used   Tobacco comment: about 3 yearly- cigar  Substance and Sexual Activity   Alcohol use: Yes    Alcohol/week: 0.0 standard drinks    Comment: social   Drug use: No   Sexual activity: Not on file  Lifestyle   Physical activity    Days per week: Not on file    Minutes per session: Not on file   Stress: Not on file  Relationships   Social connections    Talks on phone: Not on file     Gets together: Not on file    Attends religious service: Not on file    Active member of club or organization: Not on file    Attends meetings of clubs or organizations: Not on file    Relationship status: Not on file   Intimate partner violence    Fear of current or ex partner: Not on file    Emotionally abused: Not on file    Physically abused: Not on file    Forced sexual activity: Not on file  Other Topics Concern   Not on file  Social History Narrative   Not on file    FH:  Family History  Problem Relation Age of Onset   Heart disease Mother    Throat cancer Mother    Other Mother        bowel obstruction   Diabetes Father    Stroke Other    Hypertension Other    Cancer Maternal Aunt        lung, brain   Cancer Maternal Uncle        brain aneurysm   Colon cancer Neg Hx     Past Medical History:  Diagnosis Date   Acute on chronic diastolic CHF (congestive heart failure) ([place]) 08/27/2016   Anemia    Anxiety    ARDS (adult respiratory distress syndrome) ([place]) 01/27/2015   Arthritis    Ascending aortic dissection ([place]) 07/14/2008   Localized dissection of ascending aorta noted on CTA in 2009 and stable on CTA in 2011   Asymptomatic chronic venous hypertension 01/15/2010   Overview:  Overview:  Qualifier: Diagnosis of  By: [name] MD, [place]   Last Assessment & Plan:  I suggested he go to a vein clinic to see if he could be qualified compression hose of some type, since he is not a surgical candidate according to the vascular surgeons.    Atrial fibrillation (HCC)    chronic persistent   Bell's palsy    C. difficile diarrhea    CAD (coronary artery disease)    Old scar inferior wall myoview, 10/2009 EF 52%.  He did have previous SVG to RCA but no obstructive disease noted on his most recent cath.  SVG occluded.    Cellulitis 04/02/2014   Cerebral artery occlusion with cerebral infarction ([place]) 10/08/2011   Overview:  Overview:  And hx of TIA  prior to CABG, all thought due to systemic emboli prior to coumadin    Chronic diastolic heart failure ([place]) 03/01/2015   a. 12/2015: echo showing a preserved EF of 65-70%, moderate AS, and moderate TR.    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 No high-grade dysplasia    HTN (hypertension) 02/23/2014   Hyperlipidemia    Hypertension    Hypogonadism male 04/08/2012   Impaired glucose tolerance 10/08/2011   Morbid obesity ([place]) 04/02/2014   Myocardial infarction [facility]) age 45   OSA (obstructive sleep apnea)    CPAP   Peripheral vascular disease (HCC)    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 [id number]   33 mm St Jude bileaflet mechanical valve placed via right mini thoracotomy approach   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    1988 - Dr Blase Mess at [facility] in [place], [place]   Severe mitral regurgitation 10/11/2014   Testicular cancer [facility])    He was 58 y/o. He had surgical resection and rad tx's.    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     aspirin EC 81 MG EC tablet Take 1 tablet (81 mg total) by mouth daily. 30 tablet 0   gabapentin (NEURONTIN) 300 MG capsule Take 300 mg by mouth 3 (three) times daily.     magnesium oxide (MAG-OX) 400 (241.3 MG) MG tablet Take 1 tablet (400 mg total) by mouth 2 (two) times daily. (Patient taking differently: Take 400 mg by mouth 3 (three) times daily. ) 180 tablet 3   metolazone (ZAROXOLYN) 5 MG tablet Take 1 tablet by mouth every Monday and Friday 8 tablet 0   metoprolol succinate (TOPROL-XL) 25 MG 24 hr tablet Take 1 tablet (25 mg total) by mouth 2 (two) times daily. 30 tablet 6   midodrine (PROAMATINE) 5 MG tablet Take 1 tablet (5 mg total) by mouth 3 (three) times daily with meals. 90 tablet 6   mupirocin ointment (BACTROBAN) 2 % Apply 1 application topically 3 (three) times daily.      potassium chloride SA (K-DUR) 20 MEQ tablet Take 2 tablets (40 mEq total) by mouth 3 (three) times daily. 180 tablet 0   PRESCRIPTION MEDICATION See admin instructions. CPAP- At bedtime and during any time of rest     silver sulfADIAZINE (SILVADENE) 1 % cream Apply 1 application topically daily as needed (for wound care- both legs).      spironolactone (ALDACTONE) 25 MG tablet Take 1 tablet (25 mg total) by mouth daily. 30 tablet 6   torsemide (DEMADEX) 20 MG tablet Take 4 tablets (80 mg total) by mouth 2 (two) times daily. 240 tablet 11   traZODone (DESYREL) 100 MG tablet Take 100 mg by mouth  at bedtime.      warfarin (COUMADIN) 5 MG tablet 5 mg daily, and 2.5 mg on Mondays (Patient taking differently: Take 5 mg by mouth. ) 135 tablet 0   methocarbamol (ROBAXIN) 500 MG tablet Take 1 tablet (500 mg total) by mouth 3 (three) times daily. 270 tablet 0    oxyCODONE (OXY IR/ROXICODONE) 5 MG immediate release tablet Take 1 tablet (5 mg total) by mouth every 8 (eight) hours as needed for severe pain. (Patient taking differently: Take 5 mg by mouth 3 (three) times daily. ) 90 tablet 0   No current facility-administered medications for this encounter.     Vitals:   04/25/19 1017  BP: 118/62  Pulse: 91  SpO2: 100%  Weight: 82.2 kg (181 lb 3.2 oz)    Wt Readings from Last 3 Encounters:  04/25/19 82.2 kg (181 lb 3.2 oz)  04/20/19 79.7 kg (175 lb 9.6 oz)  04/19/19 81.7 kg (180 lb 3.2 oz)   PHYSICAL EXAM: General:  Well appearing. No resp difficulty HEENT: normal Neck: supple. JVP to jaw. Carotids 2+ bilat; no bruits. No lymphadenopathy or thryomegaly appreciated. Cor: PMI nondisplaced. irregular rate & rhythm. No rubs, gallops or murmurs. Lungs: clear Abdomen: soft, nontender, nondistended. No hepatosplenomegaly. No bruits or masses. Good bowel sounds. Extremities: no cyanosis, clubbing, rash, R and LLE 2-3+ edema. Partial thickness wounds or R and LLE.  Neuro: alert & orientedx3, cranial nerves grossly intact. moves all 4 extremities w/o difficulty. Affect pleasant  ASSESSMENT & PLAN: 1. Chronic diastolic CHF: Last echo in 6/20 showed preserved EF 55-60% with septal bounce from LBBB. Mean gradient was elevated across aortic valve at 30 mmHg (though not markedly elevated), and there was suggestion of mild peri-valvular regurgitation at mitral position. On TEE this admission, EF 50% with septal-lateral dyssynchrony (c/w LBBB), normal-appearing RV, mechanical MV with mild peri-valvular regurgitation and mean gradient 5, mechanical AoV with mean gradient 23 mmHg. He has significantly decompensated over the last several months, now with marked diuretic resistance. His atrial fibrillation is not new. Dysfunction of his mechanical valves does not appear to explain his worsening CHF. 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  [place] 8/6 showed mildly elevated filling pressures.  NYHA IIIb. Volume status trending up. Increase metolazone to twice a week on Monday and Friday.  Discussed limiting fluid intake to < 2 liters per day and low salt food choices.  - He will continue torsemide 80 mg twice a day.  -Continue spironolactone. - Check BMET /BNP today.  - He is on midodrine currently to maintain BP, will continue for now.  2. Valvular heart disease: Patient has mechanical mitral and aortic valves. TEE showed mechanical MV with mean gradient 5 mmHg, mild peri-valvular regurgitation. The aortic valve appeared to open reasonably well but mean gradient was 23 mmHg, ?component of mild patient-prosthesis mismatch. No indication for any sort of intervention.  - Continue ASA 81 and warfarin with INR goal 2.5-3.5.   - He will continue home coumadin regimen. 3. Atrial fibrillation: Chronic. Long-standing atrial fibrillation with severely dilated atria, unlikely to have a successful cardioversion. - Rate controlled.  -Continue Toprol XL at current dose.  4. CAD: Last cath in 4/16 with patent native coronaries, probably occluded SVG-RCA. Doubt CAD as cause of his decompensation.  - No chest pain.  5. Type 2 diabetes: SSI.  6. OSA: CPAP. 7. Anemia, iron-deficient: received feraheme 02/22/2019 and again on 04/05/19 but had severe reaction to feraheme. Placed on allergy list.  Hgb stable on 04/19/19  8. Deconditioning: HH following.   9. LLE/RLE wounds AHC to apply unna boots. He will need twice weekly dressing changes.   Follow up in 2 weeks and 8 weeks with Dr [name].   Mickal Meno NP-C   10:41 AM

## 2019-04-26 ENCOUNTER — Ambulatory Visit (INDEPENDENT_AMBULATORY_CARE_PROVIDER_SITE_OTHER): Payer: Medicare HMO | Admitting: Cardiovascular Disease

## 2019-04-26 ENCOUNTER — Inpatient Hospital Stay (HOSPITAL_COMMUNITY): Payer: Medicare HMO

## 2019-04-26 DIAGNOSIS — Z5181 Encounter for therapeutic drug level monitoring: Secondary | ICD-10-CM | POA: Diagnosis not present

## 2019-04-26 DIAGNOSIS — I482 Chronic atrial fibrillation, unspecified: Secondary | ICD-10-CM

## 2019-04-26 LAB — POCT INR: INR: 2 (ref 2.0–3.0)

## 2019-04-26 NOTE — Patient Instructions (Addendum)
Description   Spoke with Amy LPN from [facility] pt to take 7.5mg  tonight then start taking 1 tablet daily except for 1.5 tablets on Saturday. Recheck INR in 1 week. Coumadin Clinic [phone]

## 2019-04-27 ENCOUNTER — Telehealth (HOSPITAL_COMMUNITY): Payer: Self-pay

## 2019-04-27 NOTE — Telephone Encounter (Signed)
Take an extra dose of metolazone with either this afternoon's torsemide or tomorrow am's torsemide.

## 2019-04-27 NOTE — Telephone Encounter (Signed)
Called RN Amy [phone]) to advise of MD recommendations to take extra metolazone today or tomorrow (with his torsemide).  She will update our office if he does not see reduction of his weight.

## 2019-04-27 NOTE — Telephone Encounter (Signed)
Received call from [place] stating patient had a weight increase on home scale.  Pt was 174.6 yesterday and today was 181.  Pt reports legs feeling heavy however no other symptoms.  Pt reports to taking all medications as ordered and was adament about weight.  Please advise.

## 2019-05-03 ENCOUNTER — Ambulatory Visit (INDEPENDENT_AMBULATORY_CARE_PROVIDER_SITE_OTHER): Payer: Medicare HMO

## 2019-05-03 DIAGNOSIS — Z5181 Encounter for therapeutic drug level monitoring: Secondary | ICD-10-CM

## 2019-05-03 DIAGNOSIS — Z954 Presence of other heart-valve replacement: Secondary | ICD-10-CM

## 2019-05-03 DIAGNOSIS — I482 Chronic atrial fibrillation, unspecified: Secondary | ICD-10-CM

## 2019-05-03 LAB — POCT INR: INR: 4.3 — AB (ref 2.0–3.0)

## 2019-05-04 NOTE — Patient Instructions (Signed)
Description   Spoke with Amy LPN from [facility] to have pt skip today's dosage of Coumadin, then start taking 1 tablet daily.  Recheck INR in 1 week. Coumadin Clinic [phone]

## 2019-05-10 ENCOUNTER — Ambulatory Visit (INDEPENDENT_AMBULATORY_CARE_PROVIDER_SITE_OTHER): Payer: Medicare HMO | Admitting: Cardiology

## 2019-05-10 ENCOUNTER — Ambulatory Visit: Payer: Self-pay | Admitting: Internal Medicine

## 2019-05-10 DIAGNOSIS — Z5181 Encounter for therapeutic drug level monitoring: Secondary | ICD-10-CM | POA: Diagnosis not present

## 2019-05-10 DIAGNOSIS — Z954 Presence of other heart-valve replacement: Secondary | ICD-10-CM

## 2019-05-10 DIAGNOSIS — I482 Chronic atrial fibrillation, unspecified: Secondary | ICD-10-CM | POA: Diagnosis not present

## 2019-05-10 LAB — POCT INR: INR: 2.7 (ref 2.0–3.0)

## 2019-05-10 NOTE — Patient Instructions (Signed)
Description   Spoke with Amy LPN from [facility] to have pt continue taking 1 tablet (5mg ) daily.  Recheck INR in 1 week. Coumadin Clinic [phone]

## 2019-05-12 ENCOUNTER — Telehealth (HOSPITAL_COMMUNITY): Payer: Self-pay

## 2019-05-12 ENCOUNTER — Ambulatory Visit (HOSPITAL_COMMUNITY)
Admission: RE | Admit: 2019-05-12 | Discharge: 2019-05-12 | Disposition: A | Discharge status: Home / Self Care | Payer: Medicare HMO | Source: Ambulatory Visit | Attending: Internal Medicine | Admitting: Internal Medicine

## 2019-05-12 ENCOUNTER — Encounter (HOSPITAL_COMMUNITY): Payer: Self-pay

## 2019-05-12 ENCOUNTER — Other Ambulatory Visit: Payer: Self-pay

## 2019-05-12 VITALS — BP 123/63 | HR 74 | Wt 175.6 lb

## 2019-05-12 DIAGNOSIS — I080 Rheumatic disorders of both mitral and aortic valves: Secondary | ICD-10-CM | POA: Diagnosis not present

## 2019-05-12 DIAGNOSIS — I5032 Chronic diastolic (congestive) heart failure: Secondary | ICD-10-CM | POA: Insufficient documentation

## 2019-05-12 DIAGNOSIS — M109 Gout, unspecified: Secondary | ICD-10-CM | POA: Insufficient documentation

## 2019-05-12 DIAGNOSIS — Z8547 Personal history of malignant neoplasm of testis: Secondary | ICD-10-CM | POA: Insufficient documentation

## 2019-05-12 DIAGNOSIS — I447 Left bundle-branch block, unspecified: Secondary | ICD-10-CM | POA: Insufficient documentation

## 2019-05-12 DIAGNOSIS — Z801 Family history of malignant neoplasm of trachea, bronchus and lung: Secondary | ICD-10-CM | POA: Diagnosis not present

## 2019-05-12 DIAGNOSIS — Z8673 Personal history of transient ischemic attack (TIA), and cerebral infarction without residual deficits: Secondary | ICD-10-CM | POA: Diagnosis not present

## 2019-05-12 DIAGNOSIS — Z823 Family history of stroke: Secondary | ICD-10-CM | POA: Insufficient documentation

## 2019-05-12 DIAGNOSIS — Z954 Presence of other heart-valve replacement: Secondary | ICD-10-CM

## 2019-05-12 DIAGNOSIS — R9431 Abnormal electrocardiogram [ECG] [EKG]: Secondary | ICD-10-CM | POA: Insufficient documentation

## 2019-05-12 DIAGNOSIS — Z79899 Other long term (current) drug therapy: Secondary | ICD-10-CM | POA: Insufficient documentation

## 2019-05-12 DIAGNOSIS — I482 Chronic atrial fibrillation, unspecified: Secondary | ICD-10-CM | POA: Diagnosis not present

## 2019-05-12 DIAGNOSIS — Z808 Family history of malignant neoplasm of other organs or systems: Secondary | ICD-10-CM | POA: Diagnosis not present

## 2019-05-12 DIAGNOSIS — Z952 Presence of prosthetic heart valve: Secondary | ICD-10-CM | POA: Insufficient documentation

## 2019-05-12 DIAGNOSIS — Z7901 Long term (current) use of anticoagulants: Secondary | ICD-10-CM | POA: Insufficient documentation

## 2019-05-12 DIAGNOSIS — Z8249 Family history of ischemic heart disease and other diseases of the circulatory system: Secondary | ICD-10-CM | POA: Insufficient documentation

## 2019-05-12 DIAGNOSIS — Z833 Family history of diabetes mellitus: Secondary | ICD-10-CM | POA: Insufficient documentation

## 2019-05-12 DIAGNOSIS — D509 Iron deficiency anemia, unspecified: Secondary | ICD-10-CM | POA: Diagnosis not present

## 2019-05-12 DIAGNOSIS — E119 Type 2 diabetes mellitus without complications: Secondary | ICD-10-CM | POA: Insufficient documentation

## 2019-05-12 DIAGNOSIS — Z87891 Personal history of nicotine dependence: Secondary | ICD-10-CM | POA: Insufficient documentation

## 2019-05-12 DIAGNOSIS — Z800 Family history of malignant neoplasm of digestive organs: Secondary | ICD-10-CM | POA: Diagnosis not present

## 2019-05-12 DIAGNOSIS — I2510 Atherosclerotic heart disease of native coronary artery without angina pectoris: Secondary | ICD-10-CM | POA: Diagnosis not present

## 2019-05-12 DIAGNOSIS — Z7982 Long term (current) use of aspirin: Secondary | ICD-10-CM | POA: Insufficient documentation

## 2019-05-12 DIAGNOSIS — G4733 Obstructive sleep apnea (adult) (pediatric): Secondary | ICD-10-CM | POA: Diagnosis not present

## 2019-05-12 LAB — BASIC METABOLIC PANEL
Anion gap: 9 (ref 5–15)
BUN: 50 mg/dL — ABNORMAL HIGH (ref 6–20)
CO2: 33 mmol/L — ABNORMAL HIGH (ref 22–32)
Calcium: 8.2 mg/dL — ABNORMAL LOW (ref 8.9–10.3)
Chloride: 85 mmol/L — ABNORMAL LOW (ref 98–111)
Creatinine, Ser: 1.2 mg/dL (ref 0.61–1.24)
GFR calc Af Amer: >60 mL/min (ref >60)
GFR calc non Af Amer: >60 mL/min (ref >60)
Glucose, Bld: 81 mg/dL (ref 70–99)
Potassium: 3.9 mmol/L (ref 3.5–5.1)
Sodium: 127 mmol/L — ABNORMAL LOW (ref 135–145)

## 2019-05-12 LAB — CBC
HCT: 34.3 % — ABNORMAL LOW (ref 39.0–52.0)
Hemoglobin: 10.3 g/dL — ABNORMAL LOW (ref 13.0–17.0)
MCH: 22.7 pg — ABNORMAL LOW (ref 26.0–34.0)
MCHC: 30 g/dL (ref 30.0–36.0)
MCV: 75.7 fL — ABNORMAL LOW (ref 80.0–100.0)
Platelets: 203 10*3/uL (ref 150–400)
RBC: 4.53 MIL/uL (ref 4.22–5.81)
RDW: 21.5 % — ABNORMAL HIGH (ref 11.5–15.5)
WBC: 10.1 10*3/uL (ref 4.0–10.5)
nRBC: 0 % (ref 0.0–0.2)

## 2019-05-12 MED ORDER — TORSEMIDE 20 MG PO TABS: 100.0000 mg | ORAL_TABLET | Freq: Two times a day (BID) | ORAL | 11 refills | Status: DC

## 2019-05-12 NOTE — Progress Notes (Signed)
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 This procedure was complicated by ARDS and prolonged time on the vent. He had SVG-RCA with original surgery. Last cath in 4/16 prior to MV surgery showed patent native coronaries, SVG-RCA likely occluded (never identified). He has had chronic atrial fibrillation and has history of CVA.   Initially after MV surgery, he did fairly well. 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 He had reasonable quality of life on po Lasix with occasional episodes of fluid retention that were manageable. However, starting in the Spring of 2020, he began to decompensate. He was admitted in 6/20 and was diuresed with IV Lasix, he lost 20 lbs. However, after discharge, po diuretics were not effective at managing the re-development of volume overload. He was re-admitted 04/01/19 with marked volume overload. Diuresed with lasix drip and later transitioned to torsemide 60 mg twice a day. Hospital course complicated by severe allergic reaction to feraheme. He was placed on bipap, solumedrol, Neo and given midodrine. Discharge weight was 165.5 pounds. While in the hospital, he had a TEE showing markedly dilated LA, LV with EF 50%, septal-lateral dyssynchrony, normal RV systolic function/size, mechanical MV with mean gradient 5 and mild peri-valvular regurgitation, mechanical AoV with mean gradient 23, appeared to open well.   After discharge, weight trended up.  He has been seen by NP Clegg with adjustment of diuretics.   He returns today for followup of CHF/valvular heart disease.  Today, weight is down 5 lbs compared to prior appointment.  He is supposed to be taking metolazone twice a week, but he says that he takes an extra dose 1-2 times a week  as well.  No lightheadedness, BP stable on midodrine.  On questioning, he seems to drink a lot of fluid during the day (wife corroborates).  Not very active.  Legs wrapped.  He is short of breath walking across his house but says that he can walk 100 yards without stopping.  He has chronic fatigue and chronic orthopnea.  No PND.  No chest pain.    ECG (personally reviewed): Atrial fibrillation, LBBB  Labs (8/20): K 4.1, creatinine 0.99, BNP 195  PMH: 1. Bentall procedure 1988 with St Jude mechanical aortic valve and conduit.  Suspect mild patient-prosthetic mismatch at aortic position.  - TEE (8/20): EF 50%, septal-lateral dyssynchrony, normal RV size/systolic function, severe LAE with ?small LAA thrombus, mild-moderate TE, mechanical MV with mild peri-valvular MR mean gradient 5, mechanical aortic valve opened well, mean gradient 23.   2. Severe mitral regurgitation: Mechanical St Jude mitral valve 5/16.  Post-op complicated by ARDS and prolonged time on vent.  - TEE (8/20): EF 50%, septal-lateral dyssynchrony, normal RV size/systolic function, severe LAE with ?small LAA thrombus, mild-moderate TE, mechanical MV with mild peri-valvular MR mean gradient 5, mechanical aortic valve opened well, mean gradient 23.   3. Chronic atrial fibrillation.  4. H/o CVA 5. OSA: CPAP 6. H/o C difficile 7. Chronic diastolic CHF:  Suspect restrictive cardiomyopathy.  Echo (6/20) with EF 55-60%, septal bounce c/w LBBB, RV normal size and systolic function, PASP 62 mmHg, severe biatrial enlargement, mechanical MV with mild peri-valvular regurgitation, mechanical aortic valve with mean gradient 30 mmHg, moderate TR, IVC dilated.  - TEE (8/20): EF 50%,  septal-lateral dyssynchrony, normal RV size/systolic function, severe LAE with ?small LAA thrombus, mild-moderate TE, mechanical MV with mild peri-valvular MR mean gradient 5, mechanical aortic valve opened well, mean gradient 23.   - RHC (8/20): mean RA 11, PA 50/21,  mean PCWP 19, CI 3.32, PVR 2.2 WU, PAPI 2.63 8. CAD: SVG-RCA at time of initial cardiac surgery.   - LHC (4/16): Difficult to engage re-implanted vessels.  SVG-RCA probably occluded, no significant disease in native vessels.  9. Type 2 diabetes 10. Gout  11. H/o testicular cancer  ROS: All systems negative except as listed in HPI, PMH and Problem List.  SH:  Social History   Socioeconomic History   Marital status: Married    Spouse name: Not on file   Number of children: 0   Years of education: Not on file   Highest education level: Not on file  Occupational History   Occupation: Disabled Environmental education officer: UNEMPLOYED  Social Designer, fashion/clothing strain: Not on file   Food insecurity    Worry: Not on file    Inability: Not on file   Transportation needs    Medical: Not on file    Non-medical: Not on file  Tobacco Use   Smoking status: Former Smoker    Types: Cigars   Smokeless tobacco: Never Used   Tobacco comment: about 3 yearly- cigar  Substance and Sexual Activity   Alcohol use: Yes    Alcohol/week: 0.0 standard drinks    Comment: social   Drug use: No   Sexual activity: Not on file  Lifestyle   Physical activity    Days per week: Not on file    Minutes per session: Not on file   Stress: Not on file  Relationships   Social connections    Talks on phone: Not on file    Gets together: Not on file    Attends religious service: Not on file    Active member of club or organization: Not on file    Attends meetings of clubs or organizations: Not on file    Relationship status: Not on file   Intimate partner violence    Fear of current or ex partner: Not on file    Emotionally abused: Not on file    Physically abused: Not on file    Forced sexual activity: Not on file  Other Topics Concern   Not on file  Social History Narrative   Not on file    FH:  Family History  Problem Relation Age of Onset   Heart disease Mother     Throat cancer Mother    Other Mother        bowel obstruction   Diabetes Father    Stroke Other    Hypertension Other    Cancer Maternal Aunt        lung, brain   Cancer Maternal Uncle        brain aneurysm   Colon cancer Neg Hx     Current Outpatient Medications  Medication Sig Dispense Refill   allopurinol (ZYLOPRIM) 300 MG tablet Take 300 mg by mouth daily.     aspirin EC 81 MG EC tablet Take 1 tablet (81 mg total) by mouth daily. 30 tablet 0   gabapentin (NEURONTIN) 300 MG capsule Take 300 mg by mouth 3 (three) times daily.     magnesium oxide (MAG-OX) 400 (241.3 MG) MG tablet Take 1 tablet (400 mg total) by mouth 2 (two)  times daily. (Patient taking differently: Take 400 mg by mouth 3 (three) times daily. ) 180 tablet 3   methocarbamol (ROBAXIN) 500 MG tablet Take 1 tablet (500 mg total) by mouth 3 (three) times daily. 270 tablet 0   metolazone (ZAROXOLYN) 5 MG tablet Take 1 tablet by mouth every Monday and Friday 8 tablet 0   metoprolol succinate (TOPROL-XL) 25 MG 24 hr tablet Take 1 tablet (25 mg total) by mouth 2 (two) times daily. 30 tablet 6   midodrine (PROAMATINE) 5 MG tablet Take 1 tablet (5 mg total) by mouth 3 (three) times daily with meals. 90 tablet 6   mupirocin ointment (BACTROBAN) 2 % Apply 1 application topically 3 (three) times daily.      oxyCODONE (OXY IR/ROXICODONE) 5 MG immediate release tablet Take 1 tablet (5 mg total) by mouth every 8 (eight) hours as needed for severe pain. (Patient taking differently: Take 5 mg by mouth 3 (three) times daily. ) 90 tablet 0   potassium chloride SA (K-DUR) 20 MEQ tablet Take 2 tablets (40 mEq total) by mouth 3 (three) times daily. 180 tablet 0   PRESCRIPTION MEDICATION See admin instructions. CPAP- At bedtime and during any time of rest     silver sulfADIAZINE (SILVADENE) 1 % cream Apply 1 application topically daily as needed (for wound care- both legs).      spironolactone (ALDACTONE) 25 MG tablet Take 1  tablet (25 mg total) by mouth daily. 30 tablet 6   torsemide (DEMADEX) 20 MG tablet Take 5 tablets (100 mg total) by mouth 2 (two) times daily. 300 tablet 11   traZODone (DESYREL) 100 MG tablet Take 100 mg by mouth at bedtime.      warfarin (COUMADIN) 5 MG tablet 5 mg daily, and 2.5 mg on Mondays (Patient taking differently: Take 5 mg by mouth. ) 135 tablet 0   No current facility-administered medications for this encounter.     Vitals:   05/12/19 1037  BP: 123/63  Pulse: 74  SpO2: 93%  Weight: 79.7 kg (175 lb 9.6 oz)    Wt Readings from Last 3 Encounters:  05/12/19 79.7 kg (175 lb 9.6 oz)  04/25/19 82.2 kg (181 lb 3.2 oz)  04/20/19 79.7 kg (175 lb 9.6 oz)   PHYSICAL EXAM: General: NAD Neck: JVP 8-9 cm, no thyromegaly or thyroid nodule.  Lungs: Clear to auscultation bilaterally with normal respiratory effort. CV: Nondisplaced PMI.  Heart irregular with mechanical S1S2, no S3/S4, 2/6 SEM RUSB.  1+ edema to knees, lower legs wrapped.  No carotid bruit.  Unable to palpate pedal pulses (lower legs wrapped).   Abdomen: Soft, nontender, no hepatosplenomegaly, no distention.  Skin: Intact without lesions or rashes.  Neurologic: Alert and oriented x 3.  Psych: Normal affect. Extremities: No clubbing or cyanosis.  HEENT: Normal.   ASSESSMENT & PLAN: 1. Chronic diastolic CHF: Echo in [id number] showed preserved EF 55-60% with septal bounce from LBBB. Mean gradient was elevated across aortic valve at 30 mmHg (though not markedly elevated), and there was suggestion of mild peri-valvular regurgitation at mitral position. On TEE in 8/20, EF 50% with septal-lateral dyssynchrony (c/w LBBB), normal-appearing RV, mechanical MV with mild peri-valvular regurgitation and mean gradient 5, mechanical AoV with mean gradient 23 mmHg. He has been difficult to manage with diuretic resistance.Dysfunction of his mechanical valves does not appear to explain his worsening CHF. Suspect that he has developed a  restrictive cardiomyopathy picture. Today, he remains volume overloaded but not markedly so.  Weight is down 5 lbs.  Creatinine has been stable.  He drinks too much fluid.  - Increase torsemide to 100 mg bid and continue metolazone twice a week.  BMET today and again in 10 days.  - We discussed fluid restriction (well less than 2L/day) and sodium restriction extensively.  -Continue spironolactone 25 daily. - Continue KCL 40 tid.  - He is on midodrine currently to maintain BP, will continue for now.  2. Valvular heart disease: Patient has mechanical mitral and aortic valves. TEE in 8/20 showed mechanical MV with mean gradient 5 mmHg, mild peri-valvular regurgitation. The aortic valve appeared to open reasonably well but mean gradient was 23 mmHg, I suspect that there is a component of mild patient-prosthesis mismatch. No indication for any sort of intervention.  - Continue ASA 81 and warfarin with INR goal 2.5-3.5.   3. Atrial fibrillation: Chronic. Long-standing atrial fibrillation with severely dilated atria, unlikely to have a successful cardioversion. Rate is reasonably controlled.  -Continue Toprol XL at current dose.  4. CAD: Last cath in 4/16 with patent native coronaries, probably occluded SVG-RCA. Doubt CAD as cause of his decompensation. No chest pain.  5. Type 2 diabetes: Per PCP.  6. OSA: CPAP. 7. Anemia, iron-deficient: received feraheme 02/22/2019 and again on 04/05/19 but had severe reaction to feraheme. Placed on allergy list.  - CBC today.  8. Deconditioning: Long discussion today about trying to get more active.  He has a treadmill at home and needs to use it daily.  Not a candidate for cardiac rehab as EF not low enough.    9. LLE/RLE wounds: AHC applying unna boots and following wounds.   Follow up in 3 weeks with NP.    Loralie Champagne 05/12/2019 11:28 AM

## 2019-05-12 NOTE — Patient Instructions (Signed)
INCREASE Torsemide to 100 mg, (5 tabs) twice daily  Labs today We will only contact you if something comes back abnormal or we need to make some changes. Otherwise no news is good news!  Labs needed in 7-10 days  Your physician recommends that you schedule a follow-up appointment in: 3 weeks with [name],NP and keep follow already scheduled with Dr [name]  Your physician discussed the importance of regular exercise and recommended that you start or continue a regular exercise program for good health. Be sure to use treadmill for 10 minutes every day   Do the following things EVERYDAY: 1) Weigh yourself in the morning before breakfast. Write it down and keep it in a log. 2) Take your medicines as prescribed 3) Eat low salt foods-Limit salt (sodium) to 2000 mg per day.  4) Stay as active as you can everyday 5) Limit all fluids for the day to less than 2 liters  At the [facility], you and your health needs are our priority. As part of our continuing mission to provide you with exceptional heart care, we have created designated Provider Care Teams. These Care Teams include your primary Cardiologist (physician) and Advanced Practice Providers (APPs- Physician Assistants and Nurse Practitioners) who all work together to provide you with the care you need, when you need it.   You may see any of the following providers on your designated Care Team at your next follow up: Marland Kitchen Dr Glori Bickers . Dr Loralie Champagne . Darrick Grinder, NP   Please be sure to bring in all your medications bottles to every appointment.

## 2019-05-12 NOTE — Telephone Encounter (Signed)
Spoke with patient and wife, aware of lab results sodium 127 and recommendations to restrict fluid intake to less than 2L daily.  Verbalized understanding.

## 2019-05-12 NOTE — Telephone Encounter (Signed)
-----   Message from Larey Dresser, MD sent at 05/12/2019  2:13 PM EDT ----- Low sodium, needs to keep total po fluid intake < 2L daily at minimum.

## 2019-05-17 ENCOUNTER — Ambulatory Visit (INDEPENDENT_AMBULATORY_CARE_PROVIDER_SITE_OTHER): Payer: Medicare HMO | Admitting: Cardiology

## 2019-05-17 ENCOUNTER — Telehealth (HOSPITAL_COMMUNITY): Payer: Self-pay

## 2019-05-17 DIAGNOSIS — I482 Chronic atrial fibrillation, unspecified: Secondary | ICD-10-CM | POA: Diagnosis not present

## 2019-05-17 DIAGNOSIS — Z5181 Encounter for therapeutic drug level monitoring: Secondary | ICD-10-CM

## 2019-05-17 LAB — POCT INR: INR: 2.4 (ref 2.0–3.0)

## 2019-05-17 MED ORDER — TORSEMIDE 20 MG PO TABS: 80.0000 mg | ORAL_TABLET | Freq: Two times a day (BID) | ORAL | 11 refills | Status: DC

## 2019-05-17 NOTE — Telephone Encounter (Signed)
Spoke with Amy of [facility] advised that patient can continue Torsemide at 80mg  BID and will repeat bmet on Thursday.  Amy will make med changes with patient.  Advised if patient experiences build up of fluid to call office to discuss and manage

## 2019-05-17 NOTE — Telephone Encounter (Signed)
He can keep torsemide at 80 mg bid.  Get a BMET this week.

## 2019-05-17 NOTE — Patient Instructions (Signed)
Description   Spoke with Amy LPN from [facility] to have pt take 1.5 tablets today, then continue taking 1 tablet (5mg ) daily.  Recheck INR in 1 week. [facility] [phone]

## 2019-05-17 NOTE — Telephone Encounter (Signed)
Amy RN from [facility] called to report that patient weight today is 168 today down from 175 on 9/10. Pt reported that over the wknd he had some dizziness and took 80mg  torsemide bid instead of his ordered dose of 100mg  bid.  Today his BP is 100/60 and no dizziness. He ambulated today without complaints.  His legs are not weeping and is looking better.  Amy is asking should there be any changes to medications or parameters for when his weight drops or experience dizziness. She will see him again on Thursday, should she get a bmet? Please advise

## 2019-05-23 ENCOUNTER — Ambulatory Visit (HOSPITAL_COMMUNITY)
Admission: RE | Admit: 2019-05-23 | Discharge: 2019-05-23 | Disposition: A | Discharge status: Home / Self Care | Payer: Medicare HMO | Source: Ambulatory Visit | Attending: Internal Medicine | Admitting: Internal Medicine

## 2019-05-23 ENCOUNTER — Other Ambulatory Visit: Payer: Self-pay

## 2019-05-23 DIAGNOSIS — I5032 Chronic diastolic (congestive) heart failure: Secondary | ICD-10-CM | POA: Diagnosis present

## 2019-05-23 LAB — BASIC METABOLIC PANEL
Anion gap: 12 (ref 5–15)
BUN: 50 mg/dL — ABNORMAL HIGH (ref 6–20)
CO2: 33 mmol/L — ABNORMAL HIGH (ref 22–32)
Calcium: 8.5 mg/dL — ABNORMAL LOW (ref 8.9–10.3)
Chloride: 84 mmol/L — ABNORMAL LOW (ref 98–111)
Creatinine, Ser: 1.21 mg/dL (ref 0.61–1.24)
GFR calc Af Amer: >60 mL/min (ref >60)
GFR calc non Af Amer: >60 mL/min (ref >60)
Glucose, Bld: 95 mg/dL (ref 70–99)
Potassium: 3.4 mmol/L — ABNORMAL LOW (ref 3.5–5.1)
Sodium: 129 mmol/L — ABNORMAL LOW (ref 135–145)

## 2019-05-24 ENCOUNTER — Telehealth (HOSPITAL_COMMUNITY): Payer: Self-pay

## 2019-05-24 ENCOUNTER — Ambulatory Visit (INDEPENDENT_AMBULATORY_CARE_PROVIDER_SITE_OTHER): Payer: Medicare HMO | Admitting: Cardiology

## 2019-05-24 DIAGNOSIS — Z5181 Encounter for therapeutic drug level monitoring: Secondary | ICD-10-CM

## 2019-05-24 DIAGNOSIS — Z954 Presence of other heart-valve replacement: Secondary | ICD-10-CM

## 2019-05-24 DIAGNOSIS — I482 Chronic atrial fibrillation, unspecified: Secondary | ICD-10-CM

## 2019-05-24 LAB — POCT INR: INR: 3.1 — AB (ref 2.0–3.0)

## 2019-05-24 MED ORDER — POTASSIUM CHLORIDE CRYS ER 20 MEQ PO TBCR: 60.0000 meq | EXTENDED_RELEASE_TABLET | Freq: Three times a day (TID) | ORAL | 6 refills | Status: DC

## 2019-05-24 NOTE — Telephone Encounter (Signed)
-----   Message from [place], NP sent at 05/23/2019  4:09 PM EDT ----- Increase potassium to 60 meq three times a day.

## 2019-05-24 NOTE — Telephone Encounter (Signed)
Called pt to relay lab results and med changes. Pt verbalized understanding. No further questions at this time.

## 2019-05-26 ENCOUNTER — Other Ambulatory Visit (HOSPITAL_COMMUNITY): Payer: Self-pay | Admitting: Cardiology

## 2019-05-30 ENCOUNTER — Telehealth (HOSPITAL_COMMUNITY): Payer: Self-pay

## 2019-05-30 NOTE — Telephone Encounter (Signed)
[facility] orders faxed to [facility]. Confirmation received

## 2019-05-31 ENCOUNTER — Ambulatory Visit (INDEPENDENT_AMBULATORY_CARE_PROVIDER_SITE_OTHER): Payer: Medicare HMO | Admitting: Pharmacist

## 2019-05-31 DIAGNOSIS — I482 Chronic atrial fibrillation, unspecified: Secondary | ICD-10-CM

## 2019-05-31 DIAGNOSIS — Z5181 Encounter for therapeutic drug level monitoring: Secondary | ICD-10-CM | POA: Diagnosis not present

## 2019-05-31 DIAGNOSIS — Z954 Presence of other heart-valve replacement: Secondary | ICD-10-CM

## 2019-05-31 LAB — POCT INR: INR: 2.6 (ref 2.0–3.0)

## 2019-06-02 ENCOUNTER — Encounter (HOSPITAL_COMMUNITY): Payer: Self-pay

## 2019-06-02 ENCOUNTER — Other Ambulatory Visit: Payer: Self-pay

## 2019-06-02 ENCOUNTER — Ambulatory Visit (HOSPITAL_COMMUNITY)
Admission: RE | Admit: 2019-06-02 | Discharge: 2019-06-02 | Disposition: A | Discharge status: Home / Self Care | Payer: Medicare HMO | Source: Ambulatory Visit | Attending: Adult Health | Admitting: Adult Health

## 2019-06-02 VITALS — BP 128/92 | HR 91 | Wt 169.8 lb

## 2019-06-02 DIAGNOSIS — G4733 Obstructive sleep apnea (adult) (pediatric): Secondary | ICD-10-CM | POA: Diagnosis not present

## 2019-06-02 DIAGNOSIS — M7989 Other specified soft tissue disorders: Secondary | ICD-10-CM | POA: Diagnosis not present

## 2019-06-02 DIAGNOSIS — Z8249 Family history of ischemic heart disease and other diseases of the circulatory system: Secondary | ICD-10-CM | POA: Diagnosis not present

## 2019-06-02 DIAGNOSIS — I482 Chronic atrial fibrillation, unspecified: Secondary | ICD-10-CM | POA: Insufficient documentation

## 2019-06-02 DIAGNOSIS — Z7982 Long term (current) use of aspirin: Secondary | ICD-10-CM | POA: Diagnosis not present

## 2019-06-02 DIAGNOSIS — Z87891 Personal history of nicotine dependence: Secondary | ICD-10-CM | POA: Insufficient documentation

## 2019-06-02 DIAGNOSIS — Z8673 Personal history of transient ischemic attack (TIA), and cerebral infarction without residual deficits: Secondary | ICD-10-CM | POA: Diagnosis not present

## 2019-06-02 DIAGNOSIS — E119 Type 2 diabetes mellitus without complications: Secondary | ICD-10-CM | POA: Insufficient documentation

## 2019-06-02 DIAGNOSIS — Z79899 Other long term (current) drug therapy: Secondary | ICD-10-CM | POA: Insufficient documentation

## 2019-06-02 DIAGNOSIS — I2510 Atherosclerotic heart disease of native coronary artery without angina pectoris: Secondary | ICD-10-CM | POA: Diagnosis not present

## 2019-06-02 DIAGNOSIS — M109 Gout, unspecified: Secondary | ICD-10-CM | POA: Diagnosis not present

## 2019-06-02 DIAGNOSIS — Z8547 Personal history of malignant neoplasm of testis: Secondary | ICD-10-CM | POA: Insufficient documentation

## 2019-06-02 DIAGNOSIS — R0600 Dyspnea, unspecified: Secondary | ICD-10-CM

## 2019-06-02 DIAGNOSIS — Z9989 Dependence on other enabling machines and devices: Secondary | ICD-10-CM

## 2019-06-02 DIAGNOSIS — I5032 Chronic diastolic (congestive) heart failure: Secondary | ICD-10-CM | POA: Diagnosis not present

## 2019-06-02 DIAGNOSIS — D509 Iron deficiency anemia, unspecified: Secondary | ICD-10-CM | POA: Insufficient documentation

## 2019-06-02 DIAGNOSIS — Z833 Family history of diabetes mellitus: Secondary | ICD-10-CM | POA: Diagnosis not present

## 2019-06-02 DIAGNOSIS — R0609 Other forms of dyspnea: Secondary | ICD-10-CM

## 2019-06-02 DIAGNOSIS — Z9889 Other specified postprocedural states: Secondary | ICD-10-CM

## 2019-06-02 DIAGNOSIS — I447 Left bundle-branch block, unspecified: Secondary | ICD-10-CM | POA: Insufficient documentation

## 2019-06-02 DIAGNOSIS — Z7901 Long term (current) use of anticoagulants: Secondary | ICD-10-CM | POA: Diagnosis not present

## 2019-06-02 DIAGNOSIS — Z952 Presence of prosthetic heart valve: Secondary | ICD-10-CM | POA: Diagnosis not present

## 2019-06-02 LAB — BASIC METABOLIC PANEL
Anion gap: 6 (ref 5–15)
BUN: 41 mg/dL — ABNORMAL HIGH (ref 6–20)
CO2: 30 mmol/L (ref 22–32)
Calcium: 8.7 mg/dL — ABNORMAL LOW (ref 8.9–10.3)
Chloride: 95 mmol/L — ABNORMAL LOW (ref 98–111)
Creatinine, Ser: 1.15 mg/dL (ref 0.61–1.24)
GFR calc Af Amer: >60 mL/min (ref >60)
GFR calc non Af Amer: >60 mL/min (ref >60)
Glucose, Bld: 93 mg/dL (ref 70–99)
Potassium: 4.8 mmol/L (ref 3.5–5.1)
Sodium: 131 mmol/L — ABNORMAL LOW (ref 135–145)

## 2019-06-02 NOTE — Progress Notes (Signed)
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 This procedure was complicated by ARDS and prolonged time on the vent. He had SVG-RCA with original surgery. Last cath in 4/16 prior to MV surgery showed patent native coronaries, SVG-RCA likely occluded (never identified). He has had chronic atrial fibrillation and has history of CVA.   Initially after MV surgery, he did fairly well. 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 He had reasonable quality of life on po Lasix with occasional episodes of fluid retention that were manageable. However, starting in the Spring of 2020, he began to decompensate. He was admitted in 6/20 and was diuresed with IV Lasix, he lost 20 lbs. However, after discharge, po diuretics were not effective at managing the re-development of volume overload. He was re-admitted 04/01/19 with marked volume overload. Diuresed with lasix drip and later transitioned to torsemide 60 mg twice a day. Hospital course complicated by severe allergic reaction to feraheme. He was placed on bipap, solumedrol, Neo and given midodrine. Discharge weight was 165.5 pounds. While in the hospital, he had a TEE showing markedly dilated LA, LV with EF 50%, septal-lateral dyssynchrony, normal RV systolic function/size, mechanical MV with mean gradient 5 and mild peri-valvular regurgitation, mechanical AoV with mean gradient 23, appeared to open well.   After discharge, weight trended up.  He has been seen by frequently with adjustment of diuretics.   Today he returns for HF follow up with his wife. Overall feeling better. Mild dyspnea with exertion. SOB with inclines.  DeniesPND/Orthopnea. Using CPAP every night. Appetite ok. Says he is trying drink around 32 ounces of fluid  per day. No bleeding issues.  No fever or chills. Weight at home 164-167  pounds. Taking all medications. Follow[facility] by [facility].   Labs (8/20): K 4.1, creatinine 0.99, BNP 195 Labs (05/23/19): K 3.4 Creatinine 1.2   PMH: 1. Bentall procedure 1988 with St Jude mechanical aortic valve and conduit.  Suspect mild patient-prosthetic mismatch at aortic position.  - TEE (8/20): EF 50%, septal-lateral dyssynchrony, normal RV size/systolic function, severe LAE with ?small LAA thrombus, mild-moderate TE, mechanical MV with mild peri-valvular MR mean gradient 5, mechanical aortic valve opened well, mean gradient 23.  2. Severe mitral regurgitation: Mechanical St Jude mitral valve 5/16.  Post-op complicated by ARDS and prolonged time on vent.  - TEE (8/20): EF 50%, septal-lateral dyssynchrony, normal RV size/systolic function, severe LAE with ?small LAA thrombus, mild-moderate TE, mechanical MV with mild peri-valvular MR mean gradient 5, mechanical aortic valve opened well, mean gradient 23.  3. Chronic atrial fibrillation.  4. H/o CVA 5. OSA: CPAP 6. H/o C difficile 7. Chronic diastolic CHF:  Suspect restrictive cardiomyopathy.  Echo (6/20) with EF 55-60%, septal bounce c/w LBBB, RV normal size and systolic function, PASP 62 mmHg, severe biatrial enlargement, mechanical MV with mild peri-valvular regurgitation, mechanical aortic valve with mean gradient 30 mmHg, moderate TR, IVC dilated.  - TEE (8/20): EF 50%, septal-lateral dyssynchrony, normal RV size/systolic function, severe LAE with ?small LAA thrombus, mild-moderate TE, mechanical MV with mild peri-valvular MR mean gradient 5, mechanical aortic valve opened well, mean gradient 23.  - RHC (8/20): mean RA 11, PA 50/21, mean PCWP 19, CI 3.32, PVR 2.2 WU, PAPI 2.63 8. CAD: SVG-RCA at time  of initial cardiac surgery.   - LHC (4/16): Difficult to engage re-implanted vessels.  SVG-RCA probably occluded, no significant disease in native vessels.  9. Type 2 diabetes 10.  Gout  11. H/o testicular cancer  ROS: All systems negative except as listed in HPI, PMH and Problem List.  SH:  Social History   Socioeconomic History   Marital status: Married    Spouse name: Not on file   Number of children: 0   Years of education: Not on file   Highest education level: Not on file  Occupational History   Occupation: Disabled Environmental education officer: UNEMPLOYED  Social Designer, fashion/clothing strain: Not on file   Food insecurity    Worry: Not on file    Inability: Not on file   Transportation needs    Medical: Not on file    Non-medical: Not on file  Tobacco Use   Smoking status: Former Smoker    Types: Cigars   Smokeless tobacco: Never Used   Tobacco comment: about 3 yearly- cigar  Substance and Sexual Activity   Alcohol use: Yes    Alcohol/week: 0.0 standard drinks    Comment: social   Drug use: No   Sexual activity: Not on file  Lifestyle   Physical activity    Days per week: Not on file    Minutes per session: Not on file   Stress: Not on file  Relationships   Social connections    Talks on phone: Not on file    Gets together: Not on file    Attends religious service: Not on file    Active member of club or organization: Not on file    Attends meetings of clubs or organizations: Not on file    Relationship status: Not on file   Intimate partner violence    Fear of current or ex partner: Not on file    Emotionally abused: Not on file    Physically abused: Not on file    Forced sexual activity: Not on file  Other Topics Concern   Not on file  Social History Narrative   Not on file    FH:  Family History  Problem Relation Age of Onset   Heart disease Mother    Throat cancer Mother    Other Mother        bowel obstruction   Diabetes Father    Stroke Other    Hypertension Other    Cancer Maternal Aunt        lung, brain   Cancer Maternal Uncle        brain aneurysm   Colon cancer Neg Hx      Current Outpatient Medications  Medication Sig Dispense Refill   allopurinol (ZYLOPRIM) 300 MG tablet Take 300 mg by mouth daily.     aspirin EC 81 MG EC tablet Take 1 tablet (81 mg total) by mouth daily. 30 tablet 0   gabapentin (NEURONTIN) 300 MG capsule Take 300 mg by mouth 3 (three) times daily.     magnesium oxide (MAG-OX) 400 MG tablet Take 400 mg by mouth 3 (three) times daily.     metolazone (ZAROXOLYN) 5 MG tablet Take 1 tablet by mouth every Monday and Friday 8 tablet 0   metoprolol succinate (TOPROL-XL) 25 MG 24 hr tablet Take 1 tablet (25 mg total) by mouth 2 (two) times daily. 30 tablet 6   midodrine (PROAMATINE) 5 MG tablet Take 1 tablet (5 mg total)  by mouth 3 (three) times daily with meals. 90 tablet 6   mupirocin ointment (BACTROBAN) 2 % Apply 1 application topically 3 (three) times daily.      oxyCODONE (OXY IR/ROXICODONE) 5 MG immediate release tablet Take 1 tablet (5 mg total) by mouth every 8 (eight) hours as needed for severe pain. (Patient taking differently: Take 5 mg by mouth 3 (three) times daily. ) 90 tablet 0   potassium chloride SA (K-DUR) 20 MEQ tablet Take 3 tablets (60 mEq total) by mouth 3 (three) times daily. 270 tablet 6   PRESCRIPTION MEDICATION See admin instructions. CPAP- At bedtime and during any time of rest     silver sulfADIAZINE (SILVADENE) 1 % cream Apply 1 application topically daily as needed (for wound care- both legs).      spironolactone (ALDACTONE) 25 MG tablet Take 1 tablet (25 mg total) by mouth daily. 30 tablet 6   torsemide (DEMADEX) 20 MG tablet Take 4 tablets (80 mg total) by mouth 2 (two) times daily. 240 tablet 11   traZODone (DESYREL) 100 MG tablet Take 100 mg by mouth at bedtime.      warfarin (COUMADIN) 5 MG tablet 5 mg daily, and 2.5 mg on Mondays (Patient taking differently: Take 5 mg by mouth. ) 135 tablet 0   methocarbamol (ROBAXIN) 500 MG tablet Take 1 tablet (500 mg total) by mouth 3 (three) times daily. 270  tablet 0   No current facility-administered medications for this encounter.     Vitals:   06/02/19 1054  BP: (!) 128/92  Pulse: 91  SpO2: 91%  Weight: 77 kg (169 lb 12.8 oz)    Wt Readings from Last 3 Encounters:  06/02/19 77 kg (169 lb 12.8 oz)  05/12/19 79.7 kg (175 lb 9.6 oz)  04/25/19 82.2 kg (181 lb 3.2 oz)   PHYSICAL EXAM: General:  Well appearing. No resp difficulty HEENT: normal Neck: supple. no JVD. Carotids 2+ bilat; no bruits. No lymphadenopathy or thryomegaly appreciated. Cor: PMI nondisplaced. Irregular rate & rhythm. Mechanical click [id number] No rubs, gallops.  Lungs: clear Abdomen: soft, nontender, nondistended. No hepatosplenomegaly. No bruits or masses. Good bowel sounds. Extremities: no cyanosis, clubbing, rash,R and LLE 1+  Edema with chronic hyperpigmentation.  Neuro: alert & orientedx3, cranial nerves grossly intact. moves all 4 extremities w/o difficulty. Affect pleasant   ASSESSMENT & PLAN: 1. Chronic diastolic CHF: Echo in [id number] showed preserved EF 55-60% with septal bounce from LBBB. Mean gradient was elevated across aortic valve at 30 mmHg (though not markedly elevated), and there was suggestion of mild peri-valvular regurgitation at mitral position. On TEE in 8/20, EF 50% with septal-lateral dyssynchrony (c/w LBBB), normal-appearing RV, mechanical MV with mild peri-valvular regurgitation and mean gradient 5, mechanical AoV with mean gradient 23 mmHg. He has been difficult to manage with diuretic resistance.Dysfunction of his mechanical valves does not appear to explain his worsening CHF. 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  - I think he would be a candidate for cardiomems to help with ongoing fluid management.  Today I discussed cardiomems and he was provided booklet. Consent obtained for cardiomems.  - NYHA IIIb. Volume status stable. Continue  torsemide to 80  mg bid and continue metolazone twice a week. Seems to be doing  better with his fluid intake.  -Continue spironolactone 25 daily. - Continue KCL 40 tid.  - He is on midodrine currently to maintain BP, will continue for now.  2. Valvular heart disease: Patient has mechanical  mitral and aortic valves. TEE in 8/20 showed mechanical MV with mean gradient 5 mmHg, mild peri-valvular regurgitation. The aortic valve appeared to open reasonably well but mean gradient was 23 mmHg, suspect that there is a component of mild patient-prosthesis mismatch. No indication for any sort of intervention.  - Continue ASA 81 and warfarin with INR goal 2.5-3.5.   3. Atrial fibrillation: Chronic. Long-standing atrial fibrillation with severely dilated atria, unlikely to have a successful cardioversion. Rate controlled.  -Continue Toprol XL at current dose.  4. CAD: Last cath in 4/16 with patent native coronaries, probably occluded SVG-RCA. Doubt CAD as cause of his decompensation. No chest pain.  5. Type 2 diabetes: Per PCP.  6. OSA: Continue CPAP nightly.  7. Anemia, iron-deficient: received feraheme 02/22/2019 and again on 04/05/19 but had severe reaction to feraheme. Placed on allergy list.  8. Deconditioning: Continue HH PT 9. LLE/RLE wounds: [facility] applying unna boots and following wounds. Much improved with leg wraps.   Follow up with Dr [name] in 4 weeks. Consent obtained for cardiomems. If approved we will need use lovenox bridge. Check BMET today.    Jeniah Kishi NP-C  06/02/2019 11:14 AM

## 2019-06-02 NOTE — Patient Instructions (Signed)
Lab work done today. We will notify you of any abnormal lab work. No news is good news!  Please follow up with the [facility] in 4 weeks.  At the [facility], you and your health needs are our priority. As part of our continuing mission to provide you with exceptional heart care, we have created designated Provider Care Teams. These Care Teams include your primary Cardiologist (physician) and Advanced Practice Providers (APPs- Physician Assistants and Nurse Practitioners) who all work together to provide you with the care you need, when you need it.   You may see any of the following providers on your designated Care Team at your next follow up: Marland Kitchen Dr Glori Bickers . Dr Loralie Champagne . Darrick Grinder, NP   Please be sure to bring in all your medications bottles to every appointment.

## 2019-06-07 ENCOUNTER — Ambulatory Visit (INDEPENDENT_AMBULATORY_CARE_PROVIDER_SITE_OTHER): Payer: Medicare HMO | Admitting: Pharmacist

## 2019-06-07 DIAGNOSIS — I482 Chronic atrial fibrillation, unspecified: Secondary | ICD-10-CM | POA: Diagnosis not present

## 2019-06-07 DIAGNOSIS — Z954 Presence of other heart-valve replacement: Secondary | ICD-10-CM

## 2019-06-07 DIAGNOSIS — Z5181 Encounter for therapeutic drug level monitoring: Secondary | ICD-10-CM | POA: Diagnosis not present

## 2019-06-07 LAB — POCT INR: INR: 1.2 — AB (ref 2.0–3.0)

## 2019-06-14 ENCOUNTER — Ambulatory Visit (INDEPENDENT_AMBULATORY_CARE_PROVIDER_SITE_OTHER): Payer: Medicare HMO | Admitting: Cardiology

## 2019-06-14 DIAGNOSIS — Z954 Presence of other heart-valve replacement: Secondary | ICD-10-CM

## 2019-06-14 DIAGNOSIS — I482 Chronic atrial fibrillation, unspecified: Secondary | ICD-10-CM | POA: Diagnosis not present

## 2019-06-14 DIAGNOSIS — Z5181 Encounter for therapeutic drug level monitoring: Secondary | ICD-10-CM

## 2019-06-14 LAB — POCT INR: INR: 2.6 (ref 2.0–3.0)

## 2019-06-14 NOTE — Patient Instructions (Signed)
Description   Spoke with Amy LPN from [facility] to have pt continue taking 1 tablet (5mg ) daily. Recheck INR in 2 weeks. [facility] [phone]

## 2019-06-16 ENCOUNTER — Telehealth (HOSPITAL_COMMUNITY): Payer: Self-pay | Admitting: *Deleted

## 2019-06-16 NOTE — Telephone Encounter (Signed)
Amy called to report patients weight is up and he has BLEE. He took off his leg wraps and has admitted to eating high sodium foods.  Amy has rewrapped his legs today. Pt also cut back torsemide to 80bid because he said he was "washed out" taking 100mg  bid. Per Darrick Grinder, NP increase torsemide to 100mg  bid as long as he can tolerate it to get weight down. Amy w/[facility] aware and will draw bmet today.

## 2019-06-17 ENCOUNTER — Telehealth (HOSPITAL_COMMUNITY): Payer: Self-pay

## 2019-06-17 NOTE — Telephone Encounter (Signed)
Received call from [facility] regarding a weight update for patient. Pt weight today 182 up from 178 yesterday.  Pt was on Torsemide 80mg  BID. Pt was advised yesterday to increase to 100mg  BID yesterday evening and so on.  Per Amy, pt was educated on medication and diet compliance. Bmet was done today, will await results.  All d/w with Amy NP, no additional changes as of right now.  Will wait for labs and update if need.

## 2019-06-20 NOTE — Telephone Encounter (Signed)
LATE ENTRY- labs results came in 10/16. Labs stable per NP. No changes noted

## 2019-06-21 ENCOUNTER — Encounter (HOSPITAL_COMMUNITY): Payer: Self-pay | Admitting: Emergency Medicine

## 2019-06-21 ENCOUNTER — Other Ambulatory Visit (HOSPITAL_COMMUNITY): Payer: Self-pay | Admitting: Cardiology

## 2019-06-21 ENCOUNTER — Telehealth (HOSPITAL_COMMUNITY): Payer: Self-pay | Admitting: *Deleted

## 2019-06-21 ENCOUNTER — Other Ambulatory Visit: Payer: Self-pay

## 2019-06-21 ENCOUNTER — Inpatient Hospital Stay (HOSPITAL_COMMUNITY)
Admission: EM | Admit: 2019-06-21 | Discharge: 2019-07-01 | DRG: 226 | Disposition: A | Discharge status: Home Health | Payer: Medicare HMO | Source: Ambulatory Visit | Attending: Internal Medicine | Admitting: Internal Medicine

## 2019-06-21 ENCOUNTER — Emergency Department (HOSPITAL_COMMUNITY): Payer: Medicare HMO

## 2019-06-21 DIAGNOSIS — T82857A Stenosis of cardiac prosthetic devices, implants and grafts, initial encounter: Secondary | ICD-10-CM | POA: Diagnosis present

## 2019-06-21 DIAGNOSIS — Z20828 Contact with and (suspected) exposure to other viral communicable diseases: Secondary | ICD-10-CM | POA: Diagnosis present

## 2019-06-21 DIAGNOSIS — I452 Bifascicular block: Secondary | ICD-10-CM | POA: Diagnosis not present

## 2019-06-21 DIAGNOSIS — E876 Hypokalemia: Secondary | ICD-10-CM | POA: Diagnosis not present

## 2019-06-21 DIAGNOSIS — Z888 Allergy status to other drugs, medicaments and biological substances status: Secondary | ICD-10-CM

## 2019-06-21 DIAGNOSIS — I2510 Atherosclerotic heart disease of native coronary artery without angina pectoris: Secondary | ICD-10-CM | POA: Diagnosis present

## 2019-06-21 DIAGNOSIS — Z923 Personal history of irradiation: Secondary | ICD-10-CM

## 2019-06-21 DIAGNOSIS — G4733 Obstructive sleep apnea (adult) (pediatric): Secondary | ICD-10-CM | POA: Diagnosis present

## 2019-06-21 DIAGNOSIS — Z880 Allergy status to penicillin: Secondary | ICD-10-CM

## 2019-06-21 DIAGNOSIS — I472 Ventricular tachycardia: Secondary | ICD-10-CM | POA: Diagnosis not present

## 2019-06-21 DIAGNOSIS — Z833 Family history of diabetes mellitus: Secondary | ICD-10-CM

## 2019-06-21 DIAGNOSIS — E871 Hypo-osmolality and hyponatremia: Secondary | ICD-10-CM | POA: Diagnosis present

## 2019-06-21 DIAGNOSIS — I5043 Acute on chronic combined systolic (congestive) and diastolic (congestive) heart failure: Secondary | ICD-10-CM | POA: Diagnosis present

## 2019-06-21 DIAGNOSIS — Z7982 Long term (current) use of aspirin: Secondary | ICD-10-CM

## 2019-06-21 DIAGNOSIS — I4821 Permanent atrial fibrillation: Secondary | ICD-10-CM | POA: Diagnosis present

## 2019-06-21 DIAGNOSIS — I340 Nonrheumatic mitral (valve) insufficiency: Secondary | ICD-10-CM | POA: Diagnosis not present

## 2019-06-21 DIAGNOSIS — Z79899 Other long term (current) drug therapy: Secondary | ICD-10-CM

## 2019-06-21 DIAGNOSIS — K219 Gastro-esophageal reflux disease without esophagitis: Secondary | ICD-10-CM | POA: Diagnosis present

## 2019-06-21 DIAGNOSIS — I25810 Atherosclerosis of coronary artery bypass graft(s) without angina pectoris: Secondary | ICD-10-CM | POA: Diagnosis present

## 2019-06-21 DIAGNOSIS — I110 Hypertensive heart disease with heart failure: Secondary | ICD-10-CM | POA: Diagnosis present

## 2019-06-21 DIAGNOSIS — I5023 Acute on chronic systolic (congestive) heart failure: Secondary | ICD-10-CM | POA: Diagnosis present

## 2019-06-21 DIAGNOSIS — I425 Other restrictive cardiomyopathy: Secondary | ICD-10-CM | POA: Diagnosis present

## 2019-06-21 DIAGNOSIS — I462 Cardiac arrest due to underlying cardiac condition: Secondary | ICD-10-CM | POA: Diagnosis not present

## 2019-06-21 DIAGNOSIS — Z882 Allergy status to sulfonamides status: Secondary | ICD-10-CM

## 2019-06-21 DIAGNOSIS — D72829 Elevated white blood cell count, unspecified: Secondary | ICD-10-CM | POA: Diagnosis not present

## 2019-06-21 DIAGNOSIS — I959 Hypotension, unspecified: Secondary | ICD-10-CM | POA: Diagnosis not present

## 2019-06-21 DIAGNOSIS — Y712 Prosthetic and other implants, materials and accessory cardiovascular devices associated with adverse incidents: Secondary | ICD-10-CM | POA: Diagnosis present

## 2019-06-21 DIAGNOSIS — I482 Chronic atrial fibrillation, unspecified: Secondary | ICD-10-CM | POA: Diagnosis not present

## 2019-06-21 DIAGNOSIS — Z952 Presence of prosthetic heart valve: Secondary | ICD-10-CM | POA: Diagnosis not present

## 2019-06-21 DIAGNOSIS — I252 Old myocardial infarction: Secondary | ICD-10-CM

## 2019-06-21 DIAGNOSIS — I361 Nonrheumatic tricuspid (valve) insufficiency: Secondary | ICD-10-CM | POA: Diagnosis not present

## 2019-06-21 DIAGNOSIS — E1151 Type 2 diabetes mellitus with diabetic peripheral angiopathy without gangrene: Secondary | ICD-10-CM | POA: Diagnosis present

## 2019-06-21 DIAGNOSIS — I5033 Acute on chronic diastolic (congestive) heart failure: Secondary | ICD-10-CM

## 2019-06-21 DIAGNOSIS — Z8673 Personal history of transient ischemic attack (TIA), and cerebral infarction without residual deficits: Secondary | ICD-10-CM | POA: Diagnosis not present

## 2019-06-21 DIAGNOSIS — I428 Other cardiomyopathies: Secondary | ICD-10-CM | POA: Diagnosis not present

## 2019-06-21 DIAGNOSIS — Z95810 Presence of automatic (implantable) cardiac defibrillator: Secondary | ICD-10-CM

## 2019-06-21 DIAGNOSIS — E785 Hyperlipidemia, unspecified: Secondary | ICD-10-CM | POA: Diagnosis present

## 2019-06-21 DIAGNOSIS — R609 Edema, unspecified: Secondary | ICD-10-CM

## 2019-06-21 DIAGNOSIS — Z808 Family history of malignant neoplasm of other organs or systems: Secondary | ICD-10-CM

## 2019-06-21 DIAGNOSIS — D509 Iron deficiency anemia, unspecified: Secondary | ICD-10-CM | POA: Diagnosis present

## 2019-06-21 DIAGNOSIS — Z452 Encounter for adjustment and management of vascular access device: Secondary | ICD-10-CM

## 2019-06-21 DIAGNOSIS — Z8719 Personal history of other diseases of the digestive system: Secondary | ICD-10-CM

## 2019-06-21 DIAGNOSIS — Z9049 Acquired absence of other specified parts of digestive tract: Secondary | ICD-10-CM

## 2019-06-21 DIAGNOSIS — Z8249 Family history of ischemic heart disease and other diseases of the circulatory system: Secondary | ICD-10-CM

## 2019-06-21 DIAGNOSIS — M109 Gout, unspecified: Secondary | ICD-10-CM | POA: Diagnosis present

## 2019-06-21 DIAGNOSIS — Z823 Family history of stroke: Secondary | ICD-10-CM

## 2019-06-21 DIAGNOSIS — Z7901 Long term (current) use of anticoagulants: Secondary | ICD-10-CM

## 2019-06-21 DIAGNOSIS — Z87891 Personal history of nicotine dependence: Secondary | ICD-10-CM

## 2019-06-21 DIAGNOSIS — I5022 Chronic systolic (congestive) heart failure: Secondary | ICD-10-CM | POA: Diagnosis not present

## 2019-06-21 DIAGNOSIS — Z8547 Personal history of malignant neoplasm of testis: Secondary | ICD-10-CM

## 2019-06-21 LAB — BASIC METABOLIC PANEL
Anion gap: 9 (ref 5–15)
BUN: 71 mg/dL — ABNORMAL HIGH (ref 6–20)
CO2: 27 mmol/L (ref 22–32)
Calcium: 8.2 mg/dL — ABNORMAL LOW (ref 8.9–10.3)
Chloride: 86 mmol/L — ABNORMAL LOW (ref 98–111)
Creatinine, Ser: 1.73 mg/dL — ABNORMAL HIGH (ref 0.61–1.24)
GFR calc Af Amer: 50 mL/min — ABNORMAL LOW (ref >60)
GFR calc non Af Amer: 43 mL/min — ABNORMAL LOW (ref >60)
Glucose, Bld: 76 mg/dL (ref 70–99)
Potassium: 4.7 mmol/L (ref 3.5–5.1)
Sodium: 122 mmol/L — ABNORMAL LOW (ref 135–145)

## 2019-06-21 LAB — HEPATIC FUNCTION PANEL
ALT: 11 U/L (ref 0–44)
AST: 27 U/L (ref 15–41)
Albumin: 1.8 g/dL — ABNORMAL LOW (ref 3.5–5.0)
Alkaline Phosphatase: 64 U/L (ref 38–126)
Bilirubin, Direct: 0.3 mg/dL — ABNORMAL HIGH (ref 0.0–0.2)
Indirect Bilirubin: 0.7 mg/dL (ref 0.3–0.9)
Total Bilirubin: 1 mg/dL (ref 0.3–1.2)
Total Protein: 5.2 g/dL — ABNORMAL LOW (ref 6.5–8.1)

## 2019-06-21 LAB — TROPONIN I (HIGH SENSITIVITY)
Troponin I (High Sensitivity): 35 ng/L — ABNORMAL HIGH (ref <18)
Troponin I (High Sensitivity): 34 ng/L — ABNORMAL HIGH (ref <18)

## 2019-06-21 LAB — CBC
HCT: 31.2 % — ABNORMAL LOW (ref 39.0–52.0)
Hemoglobin: 9.5 g/dL — ABNORMAL LOW (ref 13.0–17.0)
MCH: 22.4 pg — ABNORMAL LOW (ref 26.0–34.0)
MCHC: 30.4 g/dL (ref 30.0–36.0)
MCV: 73.4 fL — ABNORMAL LOW (ref 80.0–100.0)
Platelets: 205 10*3/uL (ref 150–400)
RBC: 4.25 MIL/uL (ref 4.22–5.81)
RDW: 19.5 % — ABNORMAL HIGH (ref 11.5–15.5)
WBC: 16.8 10*3/uL — ABNORMAL HIGH (ref 4.0–10.5)
nRBC: 0 % (ref 0.0–0.2)

## 2019-06-21 LAB — BRAIN NATRIURETIC PEPTIDE: B Natriuretic Peptide: 381.5 pg/mL — ABNORMAL HIGH (ref 0.0–100.0)

## 2019-06-21 LAB — PROTIME-INR
INR: 2.2 — ABNORMAL HIGH (ref 0.8–1.2)
Prothrombin Time: 24.4 seconds — ABNORMAL HIGH (ref 11.4–15.2)

## 2019-06-21 MED ORDER — MIDODRINE HCL 5 MG PO TABS
5.0000 mg | ORAL_TABLET | Freq: Three times a day (TID) | ORAL | Status: DC
  Administered 2019-06-22: 5 mg via ORAL
  Filled 2019-06-21 (×2): qty 1

## 2019-06-21 MED ORDER — ASPIRIN 81 MG PO CHEW: 81.0000 mg | CHEWABLE_TABLET | Freq: Every day | ORAL | Status: DC

## 2019-06-21 MED ORDER — ONDANSETRON HCL 4 MG/2ML IJ SOLN: 4.0000 mg | Freq: Four times a day (QID) | INTRAMUSCULAR | Status: DC | PRN

## 2019-06-21 MED ORDER — ZOLPIDEM TARTRATE 5 MG PO TABS: 5.0000 mg | ORAL_TABLET | Freq: Every evening | ORAL | Status: DC | PRN

## 2019-06-21 MED ORDER — METOPROLOL SUCCINATE ER 25 MG PO TB24
12.5000 mg | ORAL_TABLET | Freq: Two times a day (BID) | ORAL | Status: DC
  Administered 2019-06-22 – 2019-07-01 (×17): 12.5 mg via ORAL
  Filled 2019-06-21 (×19): qty 1

## 2019-06-21 MED ORDER — OXYCODONE HCL 5 MG PO TABS
5.0000 mg | ORAL_TABLET | Freq: Three times a day (TID) | ORAL | Status: DC | PRN
  Administered 2019-06-22 – 2019-07-01 (×18): 5 mg via ORAL
  Filled 2019-06-21 (×19): qty 1

## 2019-06-21 MED ORDER — SODIUM CHLORIDE 0.9% FLUSH: 3.0000 mL | Freq: Once | INTRAVENOUS | Status: DC

## 2019-06-21 MED ORDER — ACETAMINOPHEN 325 MG PO TABS: 650.0000 mg | ORAL_TABLET | ORAL | Status: DC | PRN

## 2019-06-21 MED ORDER — TRAZODONE HCL 50 MG PO TABS
100.0000 mg | ORAL_TABLET | Freq: Every day | ORAL | Status: DC
  Administered 2019-06-22 – 2019-06-30 (×9): 100 mg via ORAL
  Filled 2019-06-21 (×9): qty 2

## 2019-06-21 MED ORDER — METOPROLOL SUCCINATE ER 25 MG PO TB24: 25.0000 mg | ORAL_TABLET | Freq: Two times a day (BID) | ORAL | Status: DC

## 2019-06-21 MED ORDER — WARFARIN - PHARMACIST DOSING INPATIENT
Freq: Every day | Status: DC
  Administered 2019-06-28 – 2019-06-30 (×2)

## 2019-06-21 MED ORDER — ASPIRIN 81 MG PO CHEW
81.0000 mg | CHEWABLE_TABLET | Freq: Every day | ORAL | Status: DC
  Administered 2019-06-21 – 2019-06-28 (×8): 81 mg via ORAL
  Filled 2019-06-21 (×8): qty 1

## 2019-06-21 MED ORDER — FUROSEMIDE 10 MG/ML IJ SOLN
15.0000 mg/h | INTRAVENOUS | Status: DC
  Administered 2019-06-21 – 2019-06-26 (×5): 10 mg/h via INTRAVENOUS
  Administered 2019-06-27 – 2019-06-29 (×4): 15 mg/h via INTRAVENOUS
  Filled 2019-06-21 (×5): qty 25
  Filled 2019-06-21: qty 20
  Filled 2019-06-21 (×5): qty 25

## 2019-06-21 MED ORDER — NITROGLYCERIN 0.4 MG SL SUBL: 0.4000 mg | SUBLINGUAL_TABLET | SUBLINGUAL | Status: DC | PRN

## 2019-06-21 MED ORDER — WARFARIN SODIUM 10 MG PO TABS
10.0000 mg | ORAL_TABLET | ORAL | Status: AC
  Administered 2019-06-21: 10 mg via ORAL
  Filled 2019-06-21: qty 1

## 2019-06-21 MED ORDER — ALLOPURINOL 300 MG PO TABS
300.0000 mg | ORAL_TABLET | Freq: Every day | ORAL | Status: DC
  Administered 2019-06-22 – 2019-07-01 (×10): 300 mg via ORAL
  Filled 2019-06-21 (×10): qty 1

## 2019-06-21 MED ORDER — MUPIROCIN 2 % EX OINT
1.0000 "application " | TOPICAL_OINTMENT | Freq: Three times a day (TID) | CUTANEOUS | Status: DC
  Administered 2019-06-21 – 2019-07-01 (×20): 1 via TOPICAL
  Filled 2019-06-21 (×4): qty 22

## 2019-06-21 MED ORDER — ALPRAZOLAM 0.25 MG PO TABS
0.2500 mg | ORAL_TABLET | Freq: Two times a day (BID) | ORAL | Status: DC | PRN
  Administered 2019-06-21 (×2): 0.25 mg via ORAL
  Filled 2019-06-21: qty 1

## 2019-06-21 MED ORDER — SILVER SULFADIAZINE 1 % EX CREA
1.0000 "application " | TOPICAL_CREAM | Freq: Every day | CUTANEOUS | Status: DC | PRN
  Filled 2019-06-21: qty 85

## 2019-06-21 MED ORDER — GABAPENTIN 300 MG PO CAPS
300.0000 mg | ORAL_CAPSULE | Freq: Three times a day (TID) | ORAL | Status: DC
  Administered 2019-06-21 – 2019-07-01 (×29): 300 mg via ORAL
  Filled 2019-06-21 (×29): qty 1

## 2019-06-21 MED ORDER — MAGNESIUM OXIDE 400 (241.3 MG) MG PO TABS
400.0000 mg | ORAL_TABLET | Freq: Three times a day (TID) | ORAL | Status: DC
  Administered 2019-06-21 – 2019-07-01 (×29): 400 mg via ORAL
  Filled 2019-06-21 (×31): qty 1

## 2019-06-21 MED ORDER — GERHARDT'S BUTT CREAM
TOPICAL_CREAM | Freq: Three times a day (TID) | CUTANEOUS | Status: DC
  Administered 2019-06-21: 1 via TOPICAL
  Administered 2019-06-22 – 2019-06-25 (×7): via TOPICAL
  Administered 2019-06-26: 1 via TOPICAL
  Administered 2019-06-26 – 2019-07-01 (×10): via TOPICAL
  Filled 2019-06-21 (×3): qty 1

## 2019-06-21 NOTE — Progress Notes (Signed)
ANTICOAGULATION CONSULT NOTE - Initial Consult  Pharmacy Consult for warfarin Indication: Mechanical AVR Lance Cook 1988), mech Mitral valve 5/16, Afib (CHADS2VASc = 4)  Allergies  Allergen Reactions  . Feraheme [Ferumoxytol] Shortness Of Breath    SOB/Tachycardia  . Penicillins Hives and Rash    Has patient had a PCN reaction causing immediate rash, facial/tongue/throat swelling, SOB or lightheadedness with hypotension: Yes Has patient had a PCN reaction causing severe rash involving mucus membranes or skin necrosis: Yes Has patient had a PCN reaction that required hospitalization unsure Has patient had a PCN reaction occurring within the last 10 years: 2010-2012 If all of the above answers are "NO", then may proceed with Cephalosporin use.  Lance Cook Other (See Comments)    CDIFF, when taking oral tablets   . Tape Other (See Comments) and Itching    Skin irritation    Patient Measurements: Weight: 169 lb 12.1 oz (77 kg)   Vital Signs: Temp: 98.6 F (37 C) (10/20 2205) Temp Source: Oral (10/20 2205) BP: 87/60 (10/20 2205) Pulse Rate: 90 (10/20 2205)  Labs: Recent Labs    06/21/19 1507 06/21/19 1752  HGB 9.5*  --   HCT 31.2*  --   PLT 205  --   LABPROT  --  24.4*  INR  --  2.2*  CREATININE 1.73*  --   TROPONINIHS 35* 34*    Estimated Creatinine Clearance: 40.5 mL/min (A) (by C-G formula based on SCr of 1.73 mg/dL (H)).   Assessment: 58 y.o. 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   Home dose is 5mg  daily, last taken 10/19. INR slightly subtherapeutic at 2.2 today. INR fluctuating outpatient - was therapeutic on 10/13 at 2.6 but INR 1.2 on 10/6 and 2.6 on 9/29. Last admission on 04/09/19, INR was 2.3 and received 10mg  with INR up to 3.0 the next day.   Goal of Therapy:  INR 2.5 - 3.5 Monitor platelets by anticoagulation protocol: Yes    Plan:  Give warfarin 10mg  once tonight Daily INR Monitor for bleeding   Benetta Spar, PharmD, BCPS, [facility] Clinical Pharmacist

## 2019-06-21 NOTE — Telephone Encounter (Signed)
Amy called to report that despite increasing torsemide to 100mg  bid pts weight has continued to increase. He is 190lbs today. Pt was 182lbs last week.  Legs swollen up to his thighs.  No chest pain or shortness of breath. Pt does c/o fatigue and needs max assist just to stand up from the couch.  np 90/60. Per Amy patient had labs drawn with pcp and his K was 6.4 so they held his potassium. Per Amy patient really needs to be admitted but needs  EMS to transport. Amy is calling EMS for patient.

## 2019-06-21 NOTE — ED Triage Notes (Signed)
Pt sent from Dr. Belenda Cruise office for bilateral leg swelling. Hx of CHF, afib and 25lb weight gain in 4wks. BP 92/46

## 2019-06-21 NOTE — ED Provider Notes (Signed)
[facility] DEPARTMENT Provider Note   CSN: [id number] Arrival date & time: 06/21/19  1453     History   Chief Complaint Chief Complaint  Patient presents with  . Leg Swelling    HPI Lance Cook is a 58 y.o. male.     HPI   58 year old male with progressively worsening peripheral edema.  Marked weight gain over the past several days to about a week.  He estimates approximately 25 pounds.  Increasingly tired and global weakness.  He reports compliance with his medications.  No fevers or chills.  Occasional nonproductive cough.  Denies any acute pain.  Past Medical History:  Diagnosis Date  . Acute on chronic diastolic CHF (congestive heart failure) ([place]) 08/27/2016  . Anemia   . Anxiety   . ARDS (adult respiratory distress syndrome) ([place]) 01/27/2015  . Arthritis   . Ascending aortic dissection ([place]) 07/14/2008   Localized dissection of ascending aorta noted on CTA in 2009 and stable on CTA in 2011  . Asymptomatic chronic venous hypertension 01/15/2010   Overview:  Overview:  Qualifier: Diagnosis of  By: [name] MD, [name]   Last Assessment & Plan:  I suggested he go to a vein clinic to see if he could be qualified compression hose of some type, since he is not a surgical candidate according to the vascular surgeons.   . Atrial fibrillation ([place])    chronic persistent  . Bell's palsy   . C. difficile diarrhea   . CAD (coronary artery disease)    Old scar inferior wall myoview, 10/2009 EF 52%.  He did have previous SVG to RCA but no obstructive disease noted on his most recent cath.  SVG occluded.   . Cellulitis 04/02/2014  . Cerebral artery occlusion with cerebral infarction ([place]) 10/08/2011   Overview:  Overview:  And hx of TIA prior to CABG, all thought due to systemic emboli prior to coumadin   . Chronic diastolic heart failure ([place]) 03/01/2015   a. 12/2015: echo showing a preserved EF of 65-70%, moderate AS, and moderate TR.   Marland Kitchen Chronic LBP  10/08/2011  . CVA (cerebral vascular accident) ([place]) 10/08/2011   And hx of TIA prior to CABG, all thought due to systemic emboli prior to coumadin   . DIABETES MELLITUS, TYPE II 11/01/2009   Qualifier: Diagnosis of  By: [name] MD, [name]    . Diverticulosis   . 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 Encephalopathy acute 02/05/2015  . Gastric polyps   . GERD (gastroesophageal reflux disease)    not needing medication at thhis time- 01/22/15  . Gout   . H/O mechanical aortic valve replacement 11/01/2009   Qualifier: Diagnosis of  By: [name], [name]    . Hiatal hernia   . History of colon polyps 12/23/2011   Overview:  Overview:  Colonoscopy January 2010, 6 mm rectal tubulovillous adenoma. No high-grade dysplasia   . HTN (hypertension) 02/23/2014  . Hyperlipidemia   . Hypertension   . Hypogonadism male 04/08/2012  . Impaired glucose tolerance 10/08/2011  . Morbid obesity ([place]) 04/02/2014  . Myocardial infarction [facility]) age 60  . OSA (obstructive sleep apnea)    CPAP  . Peripheral vascular disease ([place])   . 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 [id number]   33 mm St Jude bileaflet mechanical valve placed via  right mini thoracotomy approach  . 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    1988 - Dr Blase Mess at [facility] in [place], [place]  . Severe mitral regurgitation 10/11/2014  . Testicular cancer [facility])    He was 58 y/o. He had surgical resection and rad tx's.   Marland Kitchen TIA (transient ischemic attack)    age 15  . Tubulovillous adenoma of colon   . Varicose veins   . 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 Venous (peripheral) insufficiency 02/03/2005    Patient Active Problem List   Diagnosis Date Noted  . Acute on chronic diastolic (congestive) heart failure ([place])  04/01/2019  . Acute on chronic congestive heart failure ([place])   . Anasarca   . Pressure injury of skin 02/14/2019  . Acute on chronic heart failure ([place]) 02/13/2019  . Benign neoplasm of ascending colon   . Benign neoplasm of rectum   . Benign neoplasm of rectosigmoid junction   . Benign neoplasm of sigmoid colon   . DM type 2 with diabetic dyslipidemia ([place]) 04/26/2018  . Lower extremity edema 03/08/2018  . Other fatigue 01/28/2018  . Chronic anticoagulation 01/05/2018  . Insomnia 11/23/2017  . Unilateral vocal cord paralysis 11/30/2016  . Leg swelling 11/28/2016  . S/P MVR (mitral valve replacement)   . Acute on chronic diastolic CHF (congestive heart failure) ([place]) 08/27/2016  . Chronic pain syndrome 07/10/2016  . Chronic diarrhea   . Benign neoplasm of transverse colon   . Scrotal swelling 05/30/2016  . Musculoskeletal pain 01/07/2016  . Personal history of malignant neoplasm of testis 07/12/2015  . Long term current use of opiate analgesic 07/11/2015  . Long term prescription opiate use 07/11/2015  . Opiate use (22.5 MME/day) 07/11/2015  . Opiate dependence ([place]) 07/11/2015  . Encounter for therapeutic drug level monitoring 07/11/2015  . Chronic low back pain ([place]) 07/11/2015  . Lumbar facet syndrome 07/11/2015  . Chronic lower extremity pain ([place]) 07/11/2015  . Adult BMI 30+ 06/19/2015  . Obesity (BMI 30-39.9) 06/19/2015  . Hypomagnesemia 02/08/2015  . Physical deconditioning 02/08/2015  . Enteritis due to Clostridium difficile 01/30/2015  . 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 [id number]  . Chronic diastolic congestive heart failure ([place])   . Type 2 diabetes mellitus with other specified complication ([place]) 99991111  . 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 Venous insufficiency of both lower extremities 04/02/2014  . Morbid obesity ([place]) 04/02/2014  . Diabetes mellitus ([place]) 03/14/2014  .  Personal history of transient ischemic attack (TIA), and cerebral infarction without residual deficits 02/23/2014  . Malignant neoplasm of testis ([place]) 02/23/2014  . H/O TIA (transient ischemic attack) and stroke 02/23/2014  . HTN (hypertension) 02/23/2014  . Lymphadenopathy 02/23/2014  . S/P AVR (aortic valve replacement) 02/23/2014  . Encounter for therapeutic drug monitoring 02/02/2014  . Long term current use of anticoagulant therapy 07/15/2012  . Hypogonadism male 04/08/2012  . 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 Testicular hypofunction 04/08/2012  . Male hypogonadism 04/08/2012  . Other testicular hypofunction 04/08/2012  . History of colonic polyps 12/23/2011  . Testicular cancer ([place])   . TIA (transient ischemic attack)   . OSA (obstructive sleep apnea)   . Lumbar spinal stenosis ([place] L4-5) 10/08/2011  . ED (erectile dysfunction) of organic origin 10/08/2011  . Lumbar canal stenosis 10/08/2011  . Low back pain 10/08/2011  . Spinal stenosis of lumbar region  without neurogenic claudication 10/08/2011  . CAD (coronary artery disease)   . Spondylosis 04/29/2010  . DYSLIPIDEMIA 01/15/2010  . Chronic venous hypertension with ulcer ([place]) 01/15/2010  . Essential hypertension 12/14/2009  . Gout 11/01/2009  . Anemia, iron deficiency 11/01/2009  . MYOCARDIAL INFARCTION, HX OF 11/01/2009  . Atrial fibrillation, chronic 11/01/2009  . PERIPHERAL EDEMA 11/01/2009  . Heart valve replaced by other means 11/01/2009  . Presence of other heart-valve replacement 11/01/2009  . Ascending aortic dissection ([place]) 07/14/2008  . 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 Venous (peripheral) insufficiency 02/03/2005    Past Surgical History:  Procedure Laterality Date  . aortic truck    . BENTALL PROCEDURE  1988   25 mm St Jude mechanical valve conduit - Dr Blase Mess at [facility] in [place], [place]  . BIOPSY  06/01/2018   Procedure: BIOPSY;  Surgeon: Jerene Bears, MD;  Location: [facility];  Service: Gastroenterology;;  . Kathleen Argue STUDY  04/04/2019   Procedure: BUBBLE STUDY;  Surgeon: Larey Dresser, MD;  Location: [facility] ENDOSCOPY;  Service: Cardiovascular;;  . CARDIAC VALVE REPLACEMENT     [phone]  . CHOLECYSTECTOMY  2011  . COLONOSCOPY WITH PROPOFOL N/A 06/17/2016   Procedure: COLONOSCOPY WITH PROPOFOL;  Surgeon: Jerene Bears, MD;  Location: [facility] ENDOSCOPY;  Service: Gastroenterology;  Laterality: N/A;  . COLONOSCOPY WITH PROPOFOL N/A 06/01/2018   Procedure: COLONOSCOPY WITH PROPOFOL;  Surgeon: Jerene Bears, MD;  Location: [facility] ENDOSCOPY;  Service: Gastroenterology;  Laterality: N/A;  . CORONARY ARTERY BYPASS GRAFT    . LASER ABLATION Left 03/06/2010   leg  . MITRAL VALVE REPLACEMENT Right 01/24/2015   Procedure: Re-Operation, MINIMALLY INVASIVE MITRAL VALVE (MV) REPLACEMENT.;  Surgeon: Rexene Alberts, MD;  Location: [facility];  Service: Open Heart Surgery;  Laterality: Right;  . ORCHIECTOMY  age 56   testicular cancer  . OTOPLASTY     bilateral, age 65  . POLYPECTOMY  06/01/2018   Procedure: POLYPECTOMY;  Surgeon: Jerene Bears, MD;  Location: [facility];  Service: Gastroenterology;;  . RIGHT HEART CATH N/A 04/07/2019   Procedure: RIGHT HEART CATH;  Surgeon: Larey Dresser, MD;  Location: [facility] CV LAB;  Service: Cardiovascular;  Laterality: N/A;  . TEE WITHOUT CARDIOVERSION N/A 10/11/2014   Procedure: TRANSESOPHAGEAL ECHOCARDIOGRAM (TEE);  Surgeon: Pixie Casino, MD;  Location: [facility] ENDOSCOPY;  Service: Cardiovascular;  Laterality: N/A;  . TEE WITHOUT CARDIOVERSION N/A 01/24/2015   Procedure: TRANSESOPHAGEAL ECHOCARDIOGRAM (TEE);  Surgeon: Rexene Alberts, MD;  Location: [facility];  Service: Open Heart Surgery;  Laterality: N/A;  . TEE WITHOUT CARDIOVERSION N/A 04/04/2019   Procedure: TRANSESOPHAGEAL ECHOCARDIOGRAM (TEE);  Surgeon: Larey Dresser, MD;  Location: [facility] ENDOSCOPY;  Service: Cardiovascular;  Laterality: N/A;  . TONSILLECTOMY  1967  .  TOOTH EXTRACTION  2016        Home Medications    Prior to Admission medications   Medication Sig Start Date End Date Taking? Authorizing Provider  allopurinol (ZYLOPRIM) 300 MG tablet Take 300 mg by mouth daily. 11/17/12   Biagio Borg, MD  aspirin EC 81 MG EC tablet Take 1 tablet (81 mg total) by mouth daily. 02/27/19   [name], MD  gabapentin (NEURONTIN) 300 MG capsule Take 300 mg by mouth 3 (three) times daily.    [provider]  magnesium oxide (MAG-OX) 400 MG tablet Take 400 mg by mouth 3 (three) times daily.    [provider]  methocarbamol (ROBAXIN) 500 MG tablet Take 1 tablet (500 mg total) by mouth 3 (three) times daily. 07/30/18 05/12/19  Vevelyn Francois, NP  metolazone (ZAROXOLYN) 5 MG tablet Take 1 tablet by mouth every Monday and Friday 04/25/19   Darrick Grinder D, NP  metoprolol succinate (TOPROL-XL) 25 MG 24 hr tablet Take 1 tablet (25 mg total) by mouth 2 (two) times daily. 04/11/19   Clegg, Amy D, NP  midodrine (PROAMATINE) 5 MG tablet Take 1 tablet (5 mg total) by mouth 3 (three) times daily with meals. 04/11/19   Clegg, Amy D, NP  mupirocin ointment (BACTROBAN) 2 % Apply 1 application topically 3 (three) times daily.     [provider]  oxyCODONE (OXY IR/ROXICODONE) 5 MG immediate release tablet Take 1 tablet (5 mg total) by mouth every 8 (eight) hours as needed for severe pain. Patient taking differently: Take 5 mg by mouth 3 (three) times daily.  09/28/18 06/02/19  Vevelyn Francois, NP  potassium chloride SA (K-DUR) 20 MEQ tablet Take 3 tablets (60 mEq total) by mouth 3 (three) times daily. 05/24/19   Conrad [place], NP  PRESCRIPTION MEDICATION See admin instructions. CPAP- At bedtime and during any time of rest    [provider]  silver sulfADIAZINE (SILVADENE) 1 % cream Apply 1 application topically daily as needed (for wound care- both legs).     [provider]  spironolactone (ALDACTONE) 25 MG tablet Take 1 tablet (25 mg  total) by mouth daily. 04/11/19   Clegg, Amy D, NP  torsemide (DEMADEX) 20 MG tablet Take 4 tablets (80 mg total) by mouth 2 (two) times daily. 05/17/19   Larey Dresser, MD  traZODone (DESYREL) 100 MG tablet Take 100 mg by mouth at bedtime.  01/24/19   [provider]  warfarin (COUMADIN) 5 MG tablet 5 mg daily, and 2.5 mg on Mondays Patient taking differently: Take 5 mg by mouth.  02/27/19   [name], MD    Family History Family History  Problem Relation Age of Onset  . Heart disease Mother   . Throat cancer Mother   . Other Mother        bowel obstruction  . Diabetes Father   . Stroke Other   . Hypertension Other   . Cancer Maternal Aunt        lung, brain  . Cancer Maternal Uncle        brain aneurysm  . Colon cancer Neg Hx     Social History Social History   Tobacco Use  . Smoking status: Former Smoker    Types: Cigars  . Smokeless tobacco: Never Used  . Tobacco comment: about 3 yearly- cigar  Substance Use Topics  . Alcohol use: Yes    Alcohol/week: 0.0 standard drinks    Comment: social  . Drug use: No     Allergies   Feraheme [ferumoxytol], Penicillins, Sulfamethoxazole-trimethoprim, and Tape   Review of Systems Review of Systems  All systems reviewed and negative, other than as noted in HPI.  Physical Exam Updated Vital Signs BP (!) 85/53 (BP Location: Right Arm)   Pulse (!) 105   Temp 99.3 F (37.4 C) (Oral)   Resp 16   Wt 77 kg   SpO2 99%   BMI 33.15 kg/m   Physical Exam Vitals signs and nursing note reviewed.  Constitutional:      General: He is not in acute distress.    Appearance: He is well-developed.  HENT:  Head: Normocephalic and atraumatic.  Eyes:     General:        Right eye: No discharge.        Left eye: No discharge.     Conjunctiva/sclera: Conjunctivae normal.  Neck:     Musculoskeletal: Neck supple.  Cardiovascular:     Rate and Rhythm: Regular rhythm. Tachycardia present.     Heart sounds:  Murmur present. No friction rub. No gallop.      Comments: Mild tachycardia.  Mechanical click. Pulmonary:     Effort: Pulmonary effort is normal. No respiratory distress.     Breath sounds: Normal breath sounds.  Abdominal:     General: There is no distension.     Palpations: Abdomen is soft.     Tenderness: There is no abdominal tenderness.  Musculoskeletal:        General: No tenderness.     Right lower leg: Edema present.     Left lower leg: Edema present.     Comments: Massive edema lower extremities extending up to the abdomen  Skin:    General: Skin is warm and dry.  Neurological:     Mental Status: He is alert.      ED Treatments / Results  Labs (all labs ordered are listed, but only abnormal results are displayed) Labs Reviewed  BASIC METABOLIC PANEL - Abnormal; Notable for the following components:      Result Value   Sodium 122 (*)    Chloride 86 (*)    BUN 71 (*)    Creatinine, Ser 1.73 (*)    Calcium 8.2 (*)    GFR calc non Af Amer 43 (*)    GFR calc Af Amer 50 (*)    All other components within normal limits  CBC - Abnormal; Notable for the following components:   WBC 16.8 (*)    Hemoglobin 9.5 (*)    HCT 31.2 (*)    MCV 73.4 (*)    MCH 22.4 (*)    RDW 19.5 (*)    All other components within normal limits  TROPONIN I (HIGH SENSITIVITY) - Abnormal; Notable for the following components:   Troponin I (High Sensitivity) 35 (*)    All other components within normal limits  PROTIME-INR  BRAIN NATRIURETIC PEPTIDE  TROPONIN I (HIGH SENSITIVITY)    EKG EKG Interpretation  Date/Time:  Tuesday June 21 2019 15:05:38 EDT Ventricular Rate:  105 PR Interval:    QRS Duration: 166 QT Interval:  388 QTC Calculation: 512 R Axis:   -42 Text Interpretation:  Atrial fibrillation with rapid ventricular response Left axis deviation Left bundle branch block Abnormal ECG Confirmed by Virgel Manifold [phone]) on 06/21/2019 5:15:23 PM   Radiology Dg Chest 2  View  Result Date: 06/21/2019 CLINICAL DATA:  Chest pain. History of testicular carcinoma. Hypertension. EXAM: CHEST - 2 VIEW COMPARISON:  April 05, 2019 FINDINGS: There is no appreciable edema or consolidation. There is cardiomegaly with central pulmonary prominence with apparent rapid peripheral tapering. Patient is status post mitral valve replacement. No adenopathy evident. No pneumothorax. No bone lesions. IMPRESSION: Cardiomegaly with apparent degree of pulmonary arterial hypertension. Status post mitral valve replacement. No edema or consolidation. No evident adenopathy. Electronically Signed   By: Lowella Grip III M.D.   On: 06/21/2019 15:54    Procedures Procedures (including critical care time)  Medications Ordered in ED Medications  sodium chloride flush (NS) 0.9 % injection 3 mL (has no administration in time range)  Initial Impression / Assessment and Plan / ED Course  I have reviewed the triage vital signs and the nursing notes.  Pertinent labs & imaging results that were available during my care of the patient were reviewed by me and considered in my medical decision making (see chart for details).       58 year old male with rapid recent weight gain.  Showing resistance to diuretics.  Labs today with worsening renal function.  Hyponatremia is likely dilutional. Initial blood pressure in the emergency room is soft.  He is on midodrine.  He is going to need admitted for diuresis. Massive edema, but not distressed. Will defer management to cardiology recs.   Final Clinical Impressions(s) / ED Diagnoses   Final diagnoses:  Peripheral edema  Acute on chronic diastolic heart failure [facility])    ED Discharge Orders    None       Virgel Manifold, MD 06/21/19 1722

## 2019-06-21 NOTE — H&P (Signed)
Cardiology Admission History and Physical:   Patient ID: Surender Watry MRN: [place]:[id number]; DOB: Aug 14, 1961   Admission date: 06/21/2019  Primary Care Provider: [name], MD Primary Cardiologist: [name], MD  Chief Complaint:  Pt presents to ED for eval of increase wt, increased edema  Patient Profile:   Augustin Viscomi is a 58 y.o. male with hx of CAD (s/p SVG to [place]; cath 2016:  SVG probably occluded; native vessels OK),  chronic diastolic CHF/possibel restrictve cardiomyopathy, AV dz (s/p Bentall with mechanical prosthesis in 1988), OSA, Type II DM, gout who presents today with increased SOB, weight gain of 25#  History of Present Illness:   Ahlijah Kobus a57 y.o.with history of Bentall procedure with St Jude mechanical aortic alve and conduit in 1988. 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 This procedure was complicated by ARDS and prolonged time on the vent. He had SVG-RCA with original surgery. Last cath in 4/16 prior to MV surgery showed patent native coronaries, SVG-RCA likely occluded (never identified). He has had chronic atrial fibrillation and has history of CVA.   Initially after MV surgery, he did fairly well. 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 He had reasonable quality of life on po Lasix with occasional episodes of fluid retention that were manageable. However, starting in the Spring of 2020, he began to decompensate. He was admitted in 6/20 and was diuresed with IV Lasix, he lost 20 lbs. However, after discharge, po diuretics were not effective at managing the re-development of volume overload. He was re-admitted 04/01/19 with marked volume overload. Diuresed with lasix drip and later transitioned to torsemide 60 mg twice a day. Hospital course complicated by severe allergic reaction to feraheme. He was placed on bipap, solumedrol, Neo and given midodrine. Discharge weight was 165.5 pounds.  While in the hospital, he had a TEE showing markedly dilated LA, LV with EF 50%, septal-lateral dyssynchrony, normal RV systolic function/size, mechanical MV with mean gradient 5 and mild peri-valvular regurgitation, mechanical AoV with mean gradient 23, appeared to open well.   Pt was last seen in CHF clinic by A Clegg on 06/02/19  He says he felt like dancing at that visit he felt so good  Wt 164 to 167 at home    Over the past couple weeks he has noticed continued increase in edema and increase in wt.   Very week     Denies change in diet   No CP   Denies dizziness but not active   No syncope  Breathing stable Called clinci    Labs check by PCP   K 6.4   KCL heald    A Clegg called for transport to [facility] by EMS     CARDIAC HX 1. Bentall procedure 1988 with St Jude mechanical aortic valve and conduit.  Suspect mild patient-prosthetic mismatch at aortic position.  - TEE (8/20): EF 50%, septal-lateral dyssynchrony, normal RV size/systolic function, severe LAE with ?small LAA thrombus, mild-moderate TE, mechanical MV with mild peri-valvular MR mean gradient 5, mechanical aortic valve opened well, mean gradient 23.    2. Hx severe mitral regurgitation: Mechanical St Jude mitral valve 5/16. Post-op complicated by ARDS and prolonged time on vent.  3. Chronic atrial fibrillation.  4 Chronic diastolic CHF:  Suspect restrictive cardiomyopathy.  Echo (6/20) with EF 55-60%, septal bounce c/w LBBB, RV normal size and systolic function, PASP 62 mmHg, severe biatrial enlargement, mechanical  MV with mild peri-valvular regurgitation, mechanical aortic valve with mean gradient 30 mmHg, moderate TR, IVC dilated.  - TEE (8/20): EF 50%, septal-lateral dyssynchrony, normal RV size/systolic function, severe LAE with ?small LAA thrombus, mild-moderate TE, mechanical MV with mild peri-valvular MR mean gradient 5, mechanical aortic valve opened well, mean gradient 23.  - RHC (8/20): mean RA 11, PA 50/21, mean PCWP  19, CI 3.32, PVR 2.2 WU, PAPI 2.63 5. CAD: SVG-RCA at time of initial cardiac surgery.  - LHC (4/16): Difficult to engage re-implanted vessels. SVG-RCA probably occluded, no significant disease in native vessels.   Heart Pathway Score:     Past Medical History:  Diagnosis Date  . Acute on chronic diastolic CHF (congestive heart failure) ([place]) 08/27/2016  . Anemia   . Anxiety   . ARDS (adult respiratory distress syndrome) ([place]) 01/27/2015  . Arthritis   . Ascending aortic dissection ([place]) 07/14/2008   Localized dissection of ascending aorta noted on CTA in 2009 and stable on CTA in 2011  . Asymptomatic chronic venous hypertension 01/15/2010   Overview:  Overview:  Qualifier: Diagnosis of  By: [name] MD, [name]   Last Assessment & Plan:  I suggested he go to a vein clinic to see if he could be qualified compression hose of some type, since he is not a surgical candidate according to the vascular surgeons.   . Atrial fibrillation ([place])    chronic persistent  . Bell's palsy   . C. difficile diarrhea   . CAD (coronary artery disease)    Old scar inferior wall myoview, 10/2009 EF 52%.  He did have previous SVG to RCA but no obstructive disease noted on his most recent cath.  SVG occluded.   . Cellulitis 04/02/2014  . Cerebral artery occlusion with cerebral infarction ([place]) 10/08/2011   Overview:  Overview:  And hx of TIA prior to CABG, all thought due to systemic emboli prior to coumadin   . Chronic diastolic heart failure ([place]) 03/01/2015   a. 12/2015: echo showing a preserved EF of 65-70%, moderate AS, and moderate TR.   Marland Kitchen Chronic LBP 10/08/2011  . CVA (cerebral vascular accident) ([place]) 10/08/2011   And hx of TIA prior to CABG, all thought due to systemic emboli prior to coumadin   . DIABETES MELLITUS, TYPE II 11/01/2009   Qualifier: Diagnosis of  By: [name] MD, [name]    . Diverticulosis   . ED (erectile dysfunction) of organic origin 10/08/2011   Overview:  Last Assessment & Plan:  S/p  unilateral orchiectomy, for testosterone level, consider androgel pump 1.62    . Encephalopathy acute 02/05/2015  . Gastric polyps   . GERD (gastroesophageal reflux disease)    not needing medication at thhis time- 01/22/15  . Gout   . H/O mechanical aortic valve replacement 11/01/2009   Qualifier: Diagnosis of  By: [name], [name]    . Hiatal hernia   . History of colon polyps 12/23/2011   Overview:  Overview:  Colonoscopy January 2010, 6 mm rectal tubulovillous adenoma. No high-grade dysplasia   . HTN (hypertension) 02/23/2014  . Hyperlipidemia   . Hypertension   . Hypogonadism male 04/08/2012  . Impaired glucose tolerance 10/08/2011  . Morbid obesity ([place]) 04/02/2014  . Myocardial infarction [facility]) age 34  . OSA (obstructive sleep apnea)    CPAP  . Peripheral vascular disease ([place])   . 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 [id number]   33 mm St Jude bileaflet mechanical valve placed via  right mini thoracotomy approach  . 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    1988 - Dr Blase Mess at [facility] in [place], [place]  . Severe mitral regurgitation 10/11/2014  . Testicular cancer [facility])    He was 58 y/o. He had surgical resection and rad tx's.   Marland Kitchen TIA (transient ischemic attack)    age 24  . Tubulovillous adenoma of colon   . Varicose veins   . 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 Venous (peripheral) insufficiency 02/03/2005    Past Surgical History:  Procedure Laterality Date  . aortic truck    . BENTALL PROCEDURE  1988   25 mm St Jude mechanical valve conduit - Dr Blase Mess at [facility] in [place], [place]  . BIOPSY  06/01/2018   Procedure: BIOPSY;  Surgeon: Jerene Bears, MD;  [facility];  Service: Gastroenterology;;  . Kathleen Argue STUDY  04/04/2019   Procedure: BUBBLE STUDY;  Surgeon: Larey Dresser, MD;  Location[facility] ENDOSCOPY;  Service: Cardiovascular;;  . CARDIAC  VALVE REPLACEMENT     [phone]  . CHOLECYSTECTOMY  2011  . COLONOSCOPY WITH PROPOFOL N/A 06/17/2016   Procedure: COLONOSCOPY WITH PROPOFOL;  Surgeon: Jerene Bears, MD;  Location: [facility] ENDOSCOPY;  Service: Gastroenterology;  Laterality: N/A;  . COLONOSCOPY WITH PROPOFOL N/A 06/01/2018   Procedure: COLONOSCOPY WITH PROPOFOL;  Surgeon: Jerene Bears, MD;  Location: [facility] ENDOSCOPY;  Service: Gastroenterology;  Laterality: N/A;  . CORONARY ARTERY BYPASS GRAFT    . LASER ABLATION Left 03/06/2010   leg  . MITRAL VALVE REPLACEMENT Right 01/24/2015   Procedure: Re-Operation, MINIMALLY INVASIVE MITRAL VALVE (MV) REPLACEMENT.;  Surgeon: Rexene Alberts, MD;  Location: [facility];  Service: Open Heart Surgery;  Laterality: Right;  . ORCHIECTOMY  age 82   testicular cancer  . OTOPLASTY     bilateral, age 53  . POLYPECTOMY  06/01/2018   Procedure: POLYPECTOMY;  Surgeon: Jerene Bears, MD;  Location: [facility];  Service: Gastroenterology;;  . RIGHT HEART CATH N/A 04/07/2019   Procedure: RIGHT HEART CATH;  Surgeon: Larey Dresser, MD;  Location: [facility];  Service: Cardiovascular;  Laterality: N/A;  . TEE WITHOUT CARDIOVERSION N/A 10/11/2014   Procedure: TRANSESOPHAGEAL ECHOCARDIOGRAM (TEE);  Surgeon: Pixie Casino, MD;  Location: [facility] ENDOSCOPY;  Service: Cardiovascular;  Laterality: N/A;  . TEE WITHOUT CARDIOVERSION N/A 01/24/2015   Procedure: TRANSESOPHAGEAL ECHOCARDIOGRAM (TEE);  Surgeon: Rexene Alberts, MD;  Location: [place];  Service: Open Heart Surgery;  Laterality: N/A;  . TEE WITHOUT CARDIOVERSION N/A 04/04/2019   Procedure: TRANSESOPHAGEAL ECHOCARDIOGRAM (TEE);  Surgeon: Larey Dresser, MD;  Location[facility] ENDOSCOPY;  Service: Cardiovascular;  Laterality: N/A;  . TONSILLECTOMY  1967  . TOOTH EXTRACTION  2016     Medications Prior to Admission: Prior to Admission medications   Medication Sig Start Date End Date Taking? Authorizing Provider  allopurinol (ZYLOPRIM) 300 MG tablet Take 300 mg by  mouth daily. 11/17/12  Yes Biagio Borg, MD  aspirin EC 81 MG EC tablet Take 1 tablet (81 mg total) by mouth daily. 02/27/19  Yes [name], MD  gabapentin (NEURONTIN) 300 MG capsule Take 300 mg by mouth 3 (three) times daily.   Yes [provider]  magnesium oxide (MAG-OX) 400 MG tablet Take 400 mg by mouth 3 (three) times daily.   Yes [provider]  metoprolol succinate (TOPROL-XL) 25 MG 24 hr tablet  Take 1 tablet (25 mg total) by mouth 2 (two) times daily. 04/11/19  Yes Clegg, Amy D, NP  midodrine (PROAMATINE) 5 MG tablet Take 1 tablet (5 mg total) by mouth 3 (three) times daily with meals. 04/11/19  Yes Clegg, Amy D, NP  mupirocin ointment (BACTROBAN) 2 % Apply 1 application topically 3 (three) times daily.    Yes [provider]  oxyCODONE (OXY IR/ROXICODONE) 5 MG immediate release tablet Take 1 tablet (5 mg total) by mouth every 8 (eight) hours as needed for severe pain. Patient taking differently: Take 5 mg by mouth 3 (three) times daily.  09/28/18 06/21/19 Yes King, Diona Foley, NP  potassium chloride SA (K-DUR) 20 MEQ tablet Take 3 tablets (60 mEq total) by mouth 3 (three) times daily. 05/24/19  Yes Clegg, Amy D, NP  PRESCRIPTION MEDICATION See admin instructions. CPAP- At bedtime and during any time of rest   Yes [provider]  silver sulfADIAZINE (SILVADENE) 1 % cream Apply 1 application topically daily as needed (for wound care- both legs).    Yes [provider]  spironolactone (ALDACTONE) 25 MG tablet Take 1 tablet (25 mg total) by mouth daily. 04/11/19  Yes Clegg, Amy D, NP  traZODone (DESYREL) 100 MG tablet Take 100 mg by mouth at bedtime.  01/24/19  Yes [provider]  warfarin (COUMADIN) 5 MG tablet 5 mg daily, and 2.5 mg on Mondays Patient taking differently: Take 5 mg by mouth daily at 6 PM. 5 mg last dose 02/27/19  Yes [name], MD  warfarin (COUMADIN) 5 MG tablet Take 5 mg by mouth daily.   Yes [provider]  methocarbamol (ROBAXIN) 500 MG tablet Take 1 tablet (500 mg total) by mouth 3 (three) times daily. 07/30/18 05/12/19  Vevelyn Francois, NP  metolazone (ZAROXOLYN) 5 MG tablet Take 1 tablet by mouth every Monday and Friday 04/25/19   Darrick Grinder D, NP  torsemide (DEMADEX) 20 MG tablet Take 4 tablets (80 mg total) by mouth 2 (two) times daily. 05/17/19   Larey Dresser, MD     Allergies:    Allergies  Allergen Reactions  . Feraheme [Ferumoxytol] Shortness Of Breath    SOB/Tachycardia  . Penicillins Hives and Rash    Has patient had a PCN reaction causing immediate rash, facial/tongue/throat swelling, SOB or lightheadedness with hypotension: Yes Has patient had a PCN reaction causing severe rash involving mucus membranes or skin necrosis: Yes Has patient had a PCN reaction that required hospitalization unsure Has patient had a PCN reaction occurring within the last 10 years: 2010-2012 If all of the above answers are "NO", then may proceed with Cephalosporin use.  [name] Other (See Comments)    CDIFF, when taking oral tablets   . Tape Other (See Comments) and Itching    Skin irritation    Social History:   Social History   Socioeconomic History  . Marital status: Married    Spouse name: Not on file  . Number of children: 0  . Years of education: Not on file  . Highest education level: Not on file  Occupational History  . Occupation: Disabled Environmental education officer: UNEMPLOYED  Social Needs  . Financial resource strain: Not on file  . Food insecurity    Worry: Not on file    Inability: Not on file  . Transportation needs    Medical: Not on file    Non-medical: Not on file  Tobacco Use  .  Smoking status: Former Smoker    Types: Cigars  . Smokeless tobacco: Never Used  . Tobacco comment: about 3 yearly- cigar  Substance and Sexual Activity  . Alcohol use: Yes    Alcohol/week: 0.0 standard drinks    Comment: social  . Drug use: No  .  Sexual activity: Not on file  Lifestyle  . Physical activity    Days per week: Not on file    Minutes per session: Not on file  . Stress: Not on file  Relationships  . Social Herbalist on phone: Not on file    Gets together: Not on file    Attends religious service: Not on file    Active member of club or organization: Not on file    Attends meetings of clubs or organizations: Not on file    Relationship status: Not on file  . Intimate partner violence    Fear of current or ex partner: Not on file    Emotionally abused: Not on file    Physically abused: Not on file    Forced sexual activity: Not on file  Other Topics Concern  . Not on file  Social History Narrative  . Not on file    Family History:   The patient's family history includes Cancer in his maternal aunt and maternal uncle; Diabetes in his father; Heart disease in his mother; Hypertension in an other family member; Other in his mother; Stroke in an other family member; Throat cancer in his mother. There is no history of Colon cancer.    ROS:  Please see the history of present illness.  All other ROS reviewed and negative.     Physical Exam/Data:   Vitals:   06/21/19 1504 06/21/19 1508  BP:  (!) 85/53  Pulse:  (!) 105  Resp:  16  Temp:  99.3 F (37.4 C)  TempSrc:  Oral  SpO2:  99%  Weight: 77 kg    No intake or output data in the 24 hours ending 06/21/19 1802 Last 3 Weights 06/21/2019 06/02/2019 05/12/2019  Weight (lbs) 169 lb 12.1 oz 169 lb 12.8 oz 175 lb 9.6 oz  Weight (kg) 77 kg 77.021 kg 79.652 kg     Body mass index is 33.15 kg/m.  General:  Pt a 58 yo male appearing frail but in NAD   HEENT: normal Lymph: no adenopathy Neck: JVP 10 Endocrine:  No thryomegaly Cardiac:  normal S1, S2; RRR   Crisp valve sounds   Gr III/VI systolic murmur LSB and base   No diastolic murmurs   Lungs:  Rel clear to auscultation    Abd: soft, nontender, no hepatomegaly  Ext:  2 to 3 + pitting edema to thigh     Legs wrapped   Did not unrapp   Musculoskeletal:  No deformities, BUE and BLE strength normal and equal Skin: Legs erythematous.    Sl excoriations    Did not remove bandages on legs   Neuro:  CNs 2-12 intact, no focal abnormalities noted Psych:  Normal affect    EKG:  The ECG that was done 10/20 was personally reviewed and demonstrate Atrial fibrilllatoin 105 bpm   LBBB   Occasional PVC  Unchanged from previous except for PVC  Relevant CV Studies:  [facility] AUg 2020 Hemodynamics (mmHg) RA mean 11 RV 46/6 PA 50/21, mean 33 PCWP mean 19  Oxygen saturations: PA 69% AO 100%  Cardiac Output (Fick) 6.24  Cardiac Index (Fick) 3.32 PVR  2.2 WU PAPI 2.63 CVP/PCWP 0.59   Laboratory Data:  High Sensitivity Troponin:   Recent Labs  Lab 06/21/19 1507  TROPONINIHS 35*      Chemistry Recent Labs  Lab 06/21/19 1507  NA 122*  K 4.7  CL 86*  CO2 27  GLUCOSE 76  BUN 71*  CREATININE 1.73*  CALCIUM 8.2*  GFRNONAA 43*  GFRAA 50*  ANIONGAP 9    No results for input(s): PROT, ALBUMIN, AST, ALT, ALKPHOS, BILITOT in the last 168 hours. Hematology Recent Labs  Lab 06/21/19 1507  WBC 16.8*  RBC 4.25  HGB 9.5*  HCT 31.2*  MCV 73.4*  MCH 22.4*  MCHC 30.4  RDW 19.5*  PLT 205   BNPNo results for input(s): BNP, PROBNP in the last 168 hours.  DDimer No results for input(s): DDIMER in the last 168 hours.   Radiology/Studies:  Dg Chest 2 View  Result Date: 06/21/2019 CLINICAL DATA:  Chest pain. History of testicular carcinoma. Hypertension. EXAM: CHEST - 2 VIEW COMPARISON:  April 05, 2019 FINDINGS: There is no appreciable edema or consolidation. There is cardiomegaly with central pulmonary prominence with apparent rapid peripheral tapering. Patient is status post mitral valve replacement. No adenopathy evident. No pneumothorax. No bone lesions. IMPRESSION: Cardiomegaly with apparent degree of pulmonary arterial hypertension. Status post mitral valve replacement. No edema or  consolidation. No evident adenopathy. Electronically Signed   By: Lowella Grip III M.D.   On: 06/21/2019 15:54    Assessment and Plan:   1  Acute on chronic diastolic CHF    Pt with probable restrictive cardiomyopathy   Not clear what has changed in the interval from 06/02/19 when he felt very good to now   He denies change in diet   No change in fluids    Did bump up metalozone which he takes up to 2 x per wk when wt up   ?if this altered renal function BP is marginal, again limiting renal perfusion. Only other concern is pt had transient drop in INR to 1.2 on 06/07/19   Back up to 2.6 on 10/13   Now 2.2    Valve sounds are crisp    I would expect more pulmonary edema on CXR if valves acutely changed  Sats dont suggest this nor does CXR   Plan   Admit     Switch diuretic to lasix gtt   Follow outpt overnight   Labs in AM HOld b blocker for now and follow BP CHF service Einar Crow) notified  2  Atrial fibrillation.Permanent  Rates mildly increased  Plan  Continue anticoagulation   Follow rates as hold b blocker   3  AV dz  Hx Bental procedure with St Jude mech AVR     Last echo earlier this summer   Mean gradient 23 mm Hg at TEE Valve sounds are crisp 4  Hx MV dz  S/p MV replacement   Mean gradient of 5 mm Hg   Mild perivalvular MR  Hold echo for now   Assess response to IV diuresis Continue coumadin  Pharmacy to follow    5.  CAD   S/p CABG  Cath showed no signif CAD  Graft occluded (2016)   No evid of active angina  4.  Type II DM  Continue meds  7.  OSA  Continue CPAP  8  ANemia  Allergic to feraheme  9  WOunds   Wrapped   Done with UNNA boots.  For questions or updates, please contact [place] Please consult [email or web address] for contact info under        Signed, Dorris Carnes, MD  06/21/2019 6:02 PM

## 2019-06-21 NOTE — ED Notes (Signed)
Attempted to give report 

## 2019-06-22 ENCOUNTER — Inpatient Hospital Stay (HOSPITAL_COMMUNITY): Payer: Medicare HMO

## 2019-06-22 ENCOUNTER — Other Ambulatory Visit: Payer: Self-pay

## 2019-06-22 ENCOUNTER — Inpatient Hospital Stay: Payer: Self-pay

## 2019-06-22 DIAGNOSIS — I361 Nonrheumatic tricuspid (valve) insufficiency: Secondary | ICD-10-CM

## 2019-06-22 DIAGNOSIS — I472 Ventricular tachycardia: Secondary | ICD-10-CM

## 2019-06-22 DIAGNOSIS — I340 Nonrheumatic mitral (valve) insufficiency: Secondary | ICD-10-CM | POA: Diagnosis not present

## 2019-06-22 DIAGNOSIS — I5033 Acute on chronic diastolic (congestive) heart failure: Secondary | ICD-10-CM | POA: Diagnosis not present

## 2019-06-22 LAB — BASIC METABOLIC PANEL
Anion gap: 11 (ref 5–15)
Anion gap: 13 (ref 5–15)
BUN: 70 mg/dL — ABNORMAL HIGH (ref 6–20)
BUN: 65 mg/dL — ABNORMAL HIGH (ref 6–20)
CO2: 26 mmol/L (ref 22–32)
CO2: 26 mmol/L (ref 22–32)
Calcium: 8.3 mg/dL — ABNORMAL LOW (ref 8.9–10.3)
Calcium: 7.6 mg/dL — ABNORMAL LOW (ref 8.9–10.3)
Chloride: 89 mmol/L — ABNORMAL LOW (ref 98–111)
Chloride: 84 mmol/L — ABNORMAL LOW (ref 98–111)
Creatinine, Ser: 1.52 mg/dL — ABNORMAL HIGH (ref 0.61–1.24)
Creatinine, Ser: 1.52 mg/dL — ABNORMAL HIGH (ref 0.61–1.24)
GFR calc Af Amer: 58 mL/min — ABNORMAL LOW (ref >60)
GFR calc Af Amer: 58 mL/min — ABNORMAL LOW (ref >60)
GFR calc non Af Amer: 50 mL/min — ABNORMAL LOW (ref >60)
GFR calc non Af Amer: 50 mL/min — ABNORMAL LOW (ref >60)
Glucose, Bld: 79 mg/dL (ref 70–99)
Glucose, Bld: 246 mg/dL — ABNORMAL HIGH (ref 70–99)
Potassium: 3.2 mmol/L — ABNORMAL LOW (ref 3.5–5.1)
Potassium: 2.9 mmol/L — ABNORMAL LOW (ref 3.5–5.1)
Sodium: 126 mmol/L — ABNORMAL LOW (ref 135–145)
Sodium: 123 mmol/L — ABNORMAL LOW (ref 135–145)

## 2019-06-22 LAB — GLUCOSE, CAPILLARY
Glucose-Capillary: 174 mg/dL — ABNORMAL HIGH (ref 70–99)
Glucose-Capillary: 255 mg/dL — ABNORMAL HIGH (ref 70–99)
Glucose-Capillary: 102 mg/dL — ABNORMAL HIGH (ref 70–99)

## 2019-06-22 LAB — ECHOCARDIOGRAM COMPLETE
Height: 60 in
Weight: 3033.53 oz

## 2019-06-22 LAB — SARS CORONAVIRUS 2 (TAT 6-24 HRS): SARS Coronavirus 2: NEGATIVE

## 2019-06-22 LAB — TROPONIN I (HIGH SENSITIVITY)
Troponin I (High Sensitivity): 22 ng/L — ABNORMAL HIGH (ref <18)
Troponin I (High Sensitivity): 30 ng/L — ABNORMAL HIGH (ref <18)

## 2019-06-22 LAB — PROTIME-INR
INR: 2 — ABNORMAL HIGH (ref 0.8–1.2)
Prothrombin Time: 22.1 seconds — ABNORMAL HIGH (ref 11.4–15.2)

## 2019-06-22 LAB — MAGNESIUM
Magnesium: 2.6 mg/dL — ABNORMAL HIGH (ref 1.7–2.4)
Magnesium: 2.2 mg/dL (ref 1.7–2.4)

## 2019-06-22 MED ORDER — SODIUM CHLORIDE 0.9% FLUSH: 10.0000 mL | INTRAVENOUS | Status: DC | PRN

## 2019-06-22 MED ORDER — POTASSIUM CHLORIDE 10 MEQ/100ML IV SOLN
10.0000 meq | INTRAVENOUS | Status: DC
  Filled 2019-06-22: qty 100

## 2019-06-22 MED ORDER — POTASSIUM CHLORIDE CRYS ER 20 MEQ PO TBCR
40.0000 meq | EXTENDED_RELEASE_TABLET | Freq: Once | ORAL | Status: AC
  Administered 2019-06-22: 40 meq via ORAL
  Filled 2019-06-22: qty 2

## 2019-06-22 MED ORDER — CHLORHEXIDINE GLUCONATE CLOTH 2 % EX PADS
6.0000 | MEDICATED_PAD | Freq: Every day | CUTANEOUS | Status: DC
  Administered 2019-06-22 – 2019-07-01 (×9): 6 via TOPICAL

## 2019-06-22 MED ORDER — POTASSIUM CHLORIDE CRYS ER 20 MEQ PO TBCR: 40.0000 meq | EXTENDED_RELEASE_TABLET | ORAL | Status: DC

## 2019-06-22 MED ORDER — AMIODARONE HCL IN DEXTROSE 360-4.14 MG/200ML-% IV SOLN
60.0000 mg/h | INTRAVENOUS | Status: AC
  Filled 2019-06-22: qty 200

## 2019-06-22 MED ORDER — MIDODRINE HCL 5 MG PO TABS
10.0000 mg | ORAL_TABLET | Freq: Three times a day (TID) | ORAL | Status: DC
  Administered 2019-06-22 – 2019-07-01 (×26): 10 mg via ORAL
  Filled 2019-06-22 (×26): qty 2

## 2019-06-22 MED ORDER — INSULIN ASPART 100 UNIT/ML ~~LOC~~ SOLN: 0.0000 [IU] | Freq: Three times a day (TID) | SUBCUTANEOUS | Status: DC

## 2019-06-22 MED ORDER — POTASSIUM CHLORIDE 10 MEQ/100ML IV SOLN
10.0000 meq | INTRAVENOUS | Status: AC
  Administered 2019-06-22 – 2019-06-23 (×4): 10 meq via INTRAVENOUS
  Filled 2019-06-22 (×5): qty 100

## 2019-06-22 MED ORDER — INSULIN ASPART 100 UNIT/ML ~~LOC~~ SOLN: 0.0000 [IU] | Freq: Every day | SUBCUTANEOUS | Status: DC

## 2019-06-22 MED ORDER — POTASSIUM CHLORIDE 10 MEQ/100ML IV SOLN: 10.0000 meq | INTRAVENOUS | Status: AC

## 2019-06-22 MED ORDER — WARFARIN SODIUM 5 MG PO TABS
10.0000 mg | ORAL_TABLET | Freq: Once | ORAL | Status: AC
  Administered 2019-06-22: 17:00:00 10 mg via ORAL
  Filled 2019-06-22: qty 2

## 2019-06-22 MED ORDER — FENTANYL CITRATE (PF) 100 MCG/2ML IJ SOLN
INTRAMUSCULAR | Status: AC
  Filled 2019-06-22: qty 2

## 2019-06-22 MED ORDER — AMIODARONE LOAD VIA INFUSION
150.0000 mg | Freq: Once | INTRAVENOUS | Status: DC
  Filled 2019-06-22: qty 83.34

## 2019-06-22 MED ORDER — PERFLUTREN LIPID MICROSPHERE
1.0000 mL | INTRAVENOUS | Status: AC | PRN
  Administered 2019-06-22: 11:00:00 4 mL via INTRAVENOUS
  Filled 2019-06-22: qty 10

## 2019-06-22 MED ORDER — POTASSIUM CHLORIDE CRYS ER 20 MEQ PO TBCR
40.0000 meq | EXTENDED_RELEASE_TABLET | ORAL | Status: AC
  Administered 2019-06-22: 40 meq via ORAL
  Filled 2019-06-22: qty 2

## 2019-06-22 MED ORDER — FUROSEMIDE 10 MG/ML IJ SOLN
40.0000 mg | Freq: Once | INTRAMUSCULAR | Status: DC
  Filled 2019-06-22: qty 4

## 2019-06-22 MED ORDER — SPIRONOLACTONE 25 MG PO TABS
25.0000 mg | ORAL_TABLET | Freq: Every day | ORAL | Status: DC
  Administered 2019-06-23 – 2019-07-01 (×9): 25 mg via ORAL
  Filled 2019-06-22 (×9): qty 1

## 2019-06-22 MED ORDER — SODIUM CHLORIDE 0.9% FLUSH
10.0000 mL | Freq: Two times a day (BID) | INTRAVENOUS | Status: DC
  Administered 2019-06-22: 22:00:00 10 mL
  Administered 2019-06-23: 10:00:00 30 mL
  Administered 2019-06-23 – 2019-06-27 (×6): 10 mL
  Administered 2019-06-27: 23:00:00 20 mL
  Administered 2019-06-28 – 2019-06-30 (×4): 10 mL
  Administered 2019-06-30: 40 mL
  Administered 2019-07-01: 10 mL

## 2019-06-22 MED ORDER — TOLVAPTAN 15 MG PO TABS
15.0000 mg | ORAL_TABLET | Freq: Once | ORAL | Status: AC
  Administered 2019-06-22: 15 mg via ORAL
  Filled 2019-06-22: qty 1

## 2019-06-22 MED ORDER — MIDAZOLAM HCL 2 MG/2ML IJ SOLN
INTRAMUSCULAR | Status: AC
  Filled 2019-06-22: qty 2

## 2019-06-22 MED ORDER — TECHNETIUM TC 99M PYROPHOSPHATE
20.0000 | Freq: Once | INTRAVENOUS | Status: AC | PRN
  Administered 2019-06-22: 20 via INTRAVENOUS
  Filled 2019-06-22: qty 20

## 2019-06-22 MED ORDER — AMIODARONE HCL IN DEXTROSE 360-4.14 MG/200ML-% IV SOLN
30.0000 mg/h | INTRAVENOUS | Status: DC
  Administered 2019-06-22 – 2019-06-28 (×10): 30 mg/h via INTRAVENOUS
  Filled 2019-06-22 (×16): qty 200

## 2019-06-22 NOTE — Progress Notes (Signed)
Called for rapid response due to cardioversion needed. No intervention needed from a respiratory stand point .

## 2019-06-22 NOTE — Consult Note (Addendum)
Advanced Heart Failure Team Consult Note   Primary Physician: [name], MD PCP-Cardiologist:  [name], MD  [facility]: Dr. [name]   Reason for Consultation: Acute on Chronic Diastolic Heart Failure and Ventricular Tachycardia   HPI:    Lance Cook is seen today for evaluation of acute on chronic diastolic heart failure at the request of Dr. Harrington Challenger, General Cardiology.   Lance Cohenis a58 y.o.with history of Bentall procedure with St Jude mechanical aortic alve and conduit in 1988. 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 This procedure was complicated by ARDS and prolonged time on the vent. He had SVG-RCA with original surgery. Last cath in 4/16 prior to MV surgery showed patent native coronaries, SVG-RCA likely occluded (never identified). He has had chronic atrial fibrillation and has history of CVA.   Initially after MV surgery, he did fairly well. 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 He had reasonable quality of life on po Lasix with occasional episodes of fluid retention that were manageable. However, starting in the Spring of 2020, he began to decompensate. He was admitted in 6/20 and was diuresed with IV Lasix, he lost 20 lbs. However, after discharge, po diuretics were not effective at managing the re-development of volume overload. He was re-admitted 04/01/19 with marked volume overload. Diuresed with lasix drip and later transitioned to torsemide 60 mg twice a day. Hospital course complicated by severe allergic reaction to feraheme. He was placed on bipap, solumedrol, Neo and given midodrine. Discharge weight was 165.5 pounds. While in the hospital, he had a TEE showing markedly dilated LA, LV with EF 50%, septal-lateral dyssynchrony, normal RV systolic function/size, mechanical MV with mean gradient 5 and mild peri-valvular regurgitation, mechanical AoV with mean gradient 23, appeared to open well.    After discharge, weight trended up.  He has been seen by frequently with adjustment of diuretics.  prior to this admit, diuretic regimen included torsemide 80 bid, spironolactone 25 mg daily and metolazone 5 mg 2 days a week (Mon + Fri).    On 10/20, he presented to the [facility] ED w/ complaints of increased dyspnea and 25 lb wt gain. Of note, his last office weight on 10/1 was 169 lb. On admit 10/20, he was 191 lb. He was started on IV lasix. Good UOP overnight. -2.1L out. Wt down from 191 to 189 lb.  Admit Scr was 1.73. He was started on IV diuretics. SCr improved today, down to 1.52. K 3.2.   Morning of 10/21, pt went into WCT in the 200s requiring defibrillation. ? afib w/ aberancy  vs VT. Started on IV amiodarone. Seen by EP (Dr. [name]). Confirmed to be VT.   RHC 04/2019 Right Heart  Right Heart Pressures RHC Procedural Findings: Hemodynamics (mmHg) RA mean 11 RV 46/6 PA 50/21, mean 33 PCWP mean 19  Oxygen saturations: PA 69% AO 100%  Cardiac Output (Fick) 6.24  Cardiac Index (Fick) 3.32 PVR 2.2 WU PAPI 2.63 CVP/PCWP 0.59     Echo 01/31/19  1. The left ventricle has normal systolic function, with an ejection fraction of 55-60%. The cavity size was normal. Left ventricular diastolic function could not be evaluated due to mitral valve replacement/repair. There is abnormal septal motion  consistent with left bundle branch block.  2. Mild hypokinesis of the left ventricular, entire septal wall.  3. The right ventricle has normal systolic function. The cavity was normal. There is no  increase in right ventricular wall thickness. Right ventricular systolic pressure is severely elevated with an estimated pressure of 62.4 mmHg.  4. Left atrial size was severely dilated.  5. Right atrial size was severely dilated.  6. A 33 St. Jude mechanical valve is present in the mitral position. Procedure Date: 01/24/2015 There is no Echo evidence of rocking of the mitral prosthesis. Echo findings are  consistent with perivalvular leak the mitral prosthesis.  7. Mitral valve regurgitation mild perivalvular. Mitral valve gradient appropriate for prosthesis mitral valve stenosis.  8. The tricuspid valve is grossly normal. Tricuspid valve regurgitation is moderate.  9. A 100mm St. Jude mechanical prosthesis valve is present in the aortic position. Normal aortic valve prosthesis. Echo findings are consistent with Mean gradient ~30 mmHg of the aortic prosthesis. 10. The aortic arch is normal in size and structure. 11. There is dilatation of the ascending aorta measuring 38 mm. 12. Post Bentall 1988. 13. The inferior vena cava was dilated in size with <50% respiratory variability.   Review of Systems: [y] = yes, [ ]  = no    General: Weight gain [ ] ; Weight loss [ ] ; Anorexia [ ] ; Fatigue [ ] ; Fever [ ] ; Chills [ ] ; Weakness [ ]    Cardiac: Chest pain/pressure [ ] ; Resting SOB [ ] ; Exertional SOB [ Y]; Orthopnea [ ] ; Pedal Edema [ ] ; Palpitations [ ] ; Syncope [ ] ; Presyncope [ ] ; Paroxysmal nocturnal dyspnea[ ]    Pulmonary: Cough [ ] ; Wheezing[ ] ; Hemoptysis[ ] ; Sputum [ ] ; Snoring [ ]    GI: Vomiting[ ] ; Dysphagia[ ] ; Melena[ ] ; Hematochezia [ ] ; Heartburn[ ] ; Abdominal pain [ ] ; Constipation [ ] ; Diarrhea [ ] ; BRBPR [ ]    GU: Hematuria[ ] ; Dysuria [ ] ; Nocturia[ ]    Vascular: Pain in legs with walking [ ] ; Pain in feet with lying flat [ ] ; Non-healing sores [ ] ; Stroke [ ] ; TIA [ ] ; Slurred speech [ ] ;   Neuro: Headaches[ ] ; Vertigo[ ] ; Seizures[ ] ; Paresthesias[ ] ;Blurred vision [ ] ; Diplopia [ ] ; Vision changes [ ]    Ortho/Skin: Arthritis [ ] ; Joint pain [ ] ; Muscle pain [ ] ; Joint swelling [ ] ; Back Pain [ ] ; Rash [ ]    Psych: Depression[ ] ; Anxiety[ ]    Heme: Bleeding problems [ ] ; Clotting disorders [ ] ; Anemia [ ]    Endocrine: Diabetes [ ] ; Thyroid dysfunction[ ]   Home Medications Prior to Admission medications   Medication Sig Start Date End Date Taking? Authorizing Provider    allopurinol (ZYLOPRIM) 300 MG tablet Take 300 mg by mouth daily. 11/17/12  Yes Biagio Borg, MD  aspirin EC 81 MG EC tablet Take 1 tablet (81 mg total) by mouth daily. 02/27/19  Yes [name], MD  gabapentin (NEURONTIN) 300 MG capsule Take 300 mg by mouth 3 (three) times daily.   Yes [provider]  magnesium oxide (MAG-OX) 400 MG tablet Take 400 mg by mouth 3 (three) times daily.   Yes [provider]  metoprolol succinate (TOPROL-XL) 25 MG 24 hr tablet Take 1 tablet (25 mg total) by mouth 2 (two) times daily. 04/11/19  Yes Clegg, Amy D, NP  midodrine (PROAMATINE) 5 MG tablet Take 1 tablet (5 mg total) by mouth 3 (three) times daily with meals. 04/11/19  Yes Clegg, Amy D, NP  mupirocin ointment (BACTROBAN) 2 % Apply 1 application topically 3 (three) times daily.    Yes [provider]  oxyCODONE (OXY IR/ROXICODONE) 5 MG immediate release tablet Take  1 tablet (5 mg total) by mouth every 8 (eight) hours as needed for severe pain. Patient taking differently: Take 5 mg by mouth 3 (three) times daily.  09/28/18 06/21/19 Yes King, Diona Foley, NP  potassium chloride SA (K-DUR) 20 MEQ tablet Take 3 tablets (60 mEq total) by mouth 3 (three) times daily. 05/24/19  Yes Clegg, Amy D, NP  PRESCRIPTION MEDICATION See admin instructions. CPAP- At bedtime and during any time of rest   Yes [provider]  silver sulfADIAZINE (SILVADENE) 1 % cream Apply 1 application topically daily as needed (for wound care- both legs).    Yes [provider]  spironolactone (ALDACTONE) 25 MG tablet Take 1 tablet (25 mg total) by mouth daily. 04/11/19  Yes Clegg, Amy D, NP  traZODone (DESYREL) 100 MG tablet Take 100 mg by mouth at bedtime.  01/24/19  Yes [provider]  warfarin (COUMADIN) 5 MG tablet 5 mg daily, and 2.5 mg on Mondays Patient taking differently: Take 5 mg by mouth daily at 6 PM. 5 mg last dose 02/27/19  Yes [name], MD  warfarin (COUMADIN) 5 MG  tablet Take 5 mg by mouth daily.   Yes [provider]  methocarbamol (ROBAXIN) 500 MG tablet Take 1 tablet (500 mg total) by mouth 3 (three) times daily. 07/30/18 05/12/19  Vevelyn Francois, NP  metolazone (ZAROXOLYN) 5 MG tablet Take 1 tablet by mouth every Monday and Friday 04/25/19   Darrick Grinder D, NP  torsemide (DEMADEX) 20 MG tablet Take 4 tablets (80 mg total) by mouth 2 (two) times daily. 05/17/19   Larey Dresser, MD    Past Medical History: Past Medical History:  Diagnosis Date   Acute on chronic diastolic CHF (congestive heart failure) ([place]) 08/27/2016   Anemia    Anxiety    ARDS (adult respiratory distress syndrome) ([place]) 01/27/2015   Arthritis    Ascending aortic dissection ([place]) 07/14/2008   Localized dissection of ascending aorta noted on CTA in 2009 and stable on CTA in 2011   Asymptomatic chronic venous hypertension 01/15/2010   Overview:  Overview:  Qualifier: Diagnosis of  By: [name] MD, [place]   Last Assessment & Plan:  I suggested he go to a vein clinic to see if he could be qualified compression hose of some type, since he is not a surgical candidate according to the vascular surgeons.    Atrial fibrillation (HCC)    chronic persistent   Bell's palsy    C. difficile diarrhea    CAD (coronary artery disease)    Old scar inferior wall myoview, 10/2009 EF 52%.  He did have previous SVG to RCA but no obstructive disease noted on his most recent cath.  SVG occluded.    Cellulitis 04/02/2014   Cerebral artery occlusion with cerebral infarction ([place]) 10/08/2011   Overview:  Overview:  And hx of TIA prior to CABG, all thought due to systemic emboli prior to coumadin    Chronic diastolic heart failure ([place]) 03/01/2015   a. 12/2015: echo showing a preserved EF of 65-70%, moderate AS, and moderate TR.    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 No high-grade dysplasia    HTN (hypertension) 02/23/2014   Hyperlipidemia    Hypertension    Hypogonadism male 04/08/2012   Impaired glucose tolerance 10/08/2011   Morbid obesity ([place]) 04/02/2014   Myocardial infarction [facility]) age 73   OSA (obstructive sleep apnea)    CPAP   Peripheral vascular disease (HCC)    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 [id number]   33 mm St Jude bileaflet mechanical valve placed via right mini thoracotomy approach   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    1988 - Dr Blase Mess at [facility] in [place], [place]   Severe mitral regurgitation 10/11/2014   Testicular cancer [facility])    He was 58 y/o. He had surgical resection and rad tx's.    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 02/03/2005   Venous (peripheral) insufficiency 02/03/2005    Past Surgical History: Past Surgical History:  Procedure Laterality Date   aortic truck     [place]   25 mm St Jude mechanical valve conduit - Dr Blase Mess at [facility] in [place], [place]  06/01/2018    Procedure: BIOPSY;  Surgeon: Jerene Bears, MD;  Location: [facility];  Service: Gastroenterology;;   Kathleen Argue STUDY  04/04/2019   Procedure: BUBBLE STUDY;  Surgeon: Larey Dresser, MD;  Location: [facility] ENDOSCOPY;  Service: Cardiovascular;;   CARDIAC VALVE REPLACEMENT     [phone]   CHOLECYSTECTOMY  2011   COLONOSCOPY WITH PROPOFOL N/A 06/17/2016   Procedure: COLONOSCOPY WITH PROPOFOL;  Surgeon: Jerene Bears, MD;  Location: [facility] ENDOSCOPY;  Service: Gastroenterology;  Laterality: N/A;   COLONOSCOPY WITH PROPOFOL N/A 06/01/2018   Procedure: COLONOSCOPY WITH PROPOFOL;  Surgeon: Jerene Bears, MD;  Location: [facility] ENDOSCOPY;  Service: Gastroenterology;  Laterality: N/A;   CORONARY ARTERY BYPASS GRAFT     LASER ABLATION Left 03/06/2010   leg   MITRAL VALVE REPLACEMENT Right 01/24/2015   Procedure: Re-Operation, MINIMALLY INVASIVE MITRAL VALVE (MV) REPLACEMENT.;  Surgeon: Rexene Alberts, MD;  Location: [place];  Service: Open Heart Surgery;  Laterality: Right;   ORCHIECTOMY  age 89   testicular cancer   OTOPLASTY     bilateral, age 41   POLYPECTOMY  06/01/2018   Procedure: POLYPECTOMY;  Surgeon: Jerene Bears, MD;  Location: [facility];  Service: Gastroenterology;;   RIGHT HEART CATH N/A 04/07/2019   Procedure: RIGHT HEART CATH;  Surgeon: Larey Dresser, MD;  Location: [place] [facility];  Service: Cardiovascular;  Laterality: N/A;   TEE WITHOUT CARDIOVERSION N/A 10/11/2014   Procedure: TRANSESOPHAGEAL ECHOCARDIOGRAM (TEE);  Surgeon: Pixie Casino, MD;  Location: [facility] ENDOSCOPY;  Service: Cardiovascular;  Laterality: N/A;   TEE WITHOUT CARDIOVERSION N/A 01/24/2015   Procedure:  TRANSESOPHAGEAL ECHOCARDIOGRAM (TEE);  Surgeon: Rexene Alberts, MD;  Location: [place];  Service: Open Heart Surgery;  Laterality: N/A;   TEE WITHOUT CARDIOVERSION N/A 04/04/2019   Procedure: TRANSESOPHAGEAL ECHOCARDIOGRAM (TEE);  Surgeon: Larey Dresser, MD;  Location[facility] ENDOSCOPY;  Service: Cardiovascular;   Laterality: N/A;   [place] EXTRACTION  2016    Family History: Family History  Problem Relation Age of Onset   Heart disease Mother    Throat cancer Mother    Other Mother        bowel obstruction   Diabetes Father    Stroke Other    Hypertension Other    Cancer Maternal Aunt        lung, brain   Cancer Maternal Uncle        brain aneurysm   Colon cancer Neg Hx     Social History: Social History   Socioeconomic History   Marital status: Married    Spouse name: Not on file   Number of children: 0   Years of education: Not on file   Highest education level: Not on file  Occupational History   Occupation: Disabled Environmental education officer: UNEMPLOYED  Social Designer, fashion/clothing strain: Not on file   Food insecurity    Worry: Not on file    Inability: Not on file   Transportation needs    Medical: Not on file    Non-medical: Not on file  Tobacco Use   Smoking status: Former Smoker    Types: Cigars   Smokeless tobacco: Never Used   Tobacco comment: about 3 yearly- cigar  Substance and Sexual Activity   Alcohol use: Yes    Alcohol/week: 0.0 standard drinks    Comment: social   Drug use: No   Sexual activity: Not on file  Lifestyle   Physical activity    Days per week: Not on file    Minutes per session: Not on file   Stress: Not on file  Relationships   Social connections    Talks on phone: Not on file    Gets together: Not on file    Attends religious service: Not on file    Active member of club or organization: Not on file    Attends meetings of clubs or organizations: Not on file    Relationship status: Not on file  Other Topics Concern   Not on file  Social History Narrative   Not on file    Allergies:  Allergies  Allergen Reactions   Feraheme [Ferumoxytol] Shortness Of Breath    SOB/Tachycardia   Penicillins Hives and Rash    Has patient had a PCN reaction causing immediate rash,  facial/tongue/throat swelling, SOB or lightheadedness with hypotension: Yes Has patient had a PCN reaction causing severe rash involving mucus membranes or skin necrosis: Yes Has patient had a PCN reaction that required hospitalization unsure Has patient had a PCN reaction occurring within the last 10 years: 2010-2012 If all of the above answers are "NO", then may proceed with Cephalosporin use.   Sulfamethoxazole-Trimethoprim Other (See Comments)    CDIFF, when taking oral tablets    Tape Other (See Comments) and Itching    Skin irritation    Objective:    Vital Signs:   Temp:  [97.6 F (36.4 C)-99.3 F (37.4 C)] 98 F (36.7 C) (10/21 0600) Pulse Rate:  [90-109] 109 (10/21 0600) Resp:  [16-23] 18 (10/21 0700) BP: (80-100)/(53-74) 94/62 (  10/21 0700) SpO2:  [93 %-100 %] 93 % (10/21 0700) Weight:  [77 kg-86.6 kg] 86 kg (10/21 0658) Last BM Date: 06/21/19  Weight change: Filed Weights   06/21/19 1504 06/21/19 2205 06/22/19 0658  Weight: 77 kg 86.6 kg 86 kg    Intake/Output:   Intake/Output Summary (Last 24 hours) at 06/22/2019 0848 Last data filed at 06/22/2019 0843 Gross per 24 hour  Intake 558.77 ml  Output 2775 ml  Net -2216.23 ml      Physical Exam    General:  Ill appearing (currently in rapid WCT w/ soft BP). No resp difficulty HEENT: normal Neck: supple. Elevated JVP . Carotids 2+ bilat; no bruits. No lymphadenopathy or thyromegaly appreciated. Cor: PMI nondisplaced. Regular rate & rhythm. No rubs, gallops or murmurs. Lungs: clear Abdomen: soft, nontender, nondistended. No hepatosplenomegaly. No bruits or masses. Good bowel sounds. Extremities: no cyanosis, clubbing, rash, 1+ bilateral edema up to knees Neuro: alert & orientedx3, cranial nerves grossly intact. moves all 4 extremities w/o difficulty. Affect pleasant   Telemetry   WCT 200s VT  EKG    afib w/ RVR 105 LBBB  Labs   Basic Metabolic Panel: Recent Labs  Lab 06/21/19 1507  06/22/19 0439  NA 122* 126*  K 4.7 3.2*  CL 86* 89*  CO2 27 26  GLUCOSE 76 79  BUN 71* 70*  CREATININE 1.73* 1.52*  CALCIUM 8.2* 8.3*    Liver Function Tests: Recent Labs  Lab 06/21/19 1752  AST 27  ALT 11  ALKPHOS 64  BILITOT 1.0  PROT 5.2*  ALBUMIN 1.8*   No results for input(s): LIPASE, AMYLASE in the last 168 hours. No results for input(s): AMMONIA in the last 168 hours.  CBC: Recent Labs  Lab 06/21/19 1507  WBC 16.8*  HGB 9.5*  HCT 31.2*  MCV 73.4*  PLT 205    Cardiac Enzymes: No results for input(s): CKTOTAL, CKMB, CKMBINDEX, TROPONINI in the last 168 hours.  BNP: BNP (last 3 results) Recent Labs    04/19/19 1316 04/25/19 1107 06/21/19 1752  BNP 195.6* 194.9* 381.5*    ProBNP (last 3 results) No results for input(s): PROBNP in the last 8760 hours.   CBG: No results for input(s): GLUCAP in the last 168 hours.  Coagulation Studies: Recent Labs    06/21/19 1752 06/22/19 0439  LABPROT 24.4* 22.1*  INR 2.2* 2.0*     Imaging   Dg Chest 2 View  Result Date: 06/21/2019 CLINICAL DATA:  Chest pain. History of testicular carcinoma. Hypertension. EXAM: CHEST - 2 VIEW COMPARISON:  April 05, 2019 FINDINGS: There is no appreciable edema or consolidation. There is cardiomegaly with central pulmonary prominence with apparent rapid peripheral tapering. Patient is status post mitral valve replacement. No adenopathy evident. No pneumothorax. No bone lesions. IMPRESSION: Cardiomegaly with apparent degree of pulmonary arterial hypertension. Status post mitral valve replacement. No edema or consolidation. No evident adenopathy. Electronically Signed   By: Lowella Grip III M.D.   On: 06/21/2019 15:54      Medications:     Current Medications:  allopurinol  300 mg Oral Daily   aspirin  81 mg Oral Daily   gabapentin  300 mg Oral TID   Gerhardt's butt cream   Topical TID   magnesium oxide  400 mg Oral TID   metoprolol succinate  12.5 mg Oral  BID   midodrine  5 mg Oral TID WC   mupirocin ointment  1 application Topical TID   sodium chloride flush  3 mL Intravenous Once   traZODone  100 mg Oral QHS   Warfarin - Pharmacist Dosing Inpatient   Does not apply q1800     Infusions:  furosemide (LASIX) infusion 10 mg/hr (06/21/19 2104)       Patient Profile   Lance Cook a57 y.o.with history of Bentall procedure with St Jude mechanical aortic alve and conduit in 1988. He then developed severe MR and had St Jude mechanical MV placed by right mini-thoracotomy in 5/16, s/p also SVG-RCA, chronic diastolic HF, chronic afib and chronic anticoagulation w/ coumadin admitted for acute on chronic diastolic HF w/ hospital course c/b hemodynamically unstable VT.  Assessment/Plan   1. VT: developed VT in the 200s, on 10/21, requiring defibrillation. Start IV amiodarone and transfer to ICU. EP to see. Will need ICD. K 3.2. Mg lab pending. Supplement electrolytes. Keep K > 4 and Mg > 2. May need repeat LHC to assess coronaries.   2. Acute on Chronic Diastolic HF: Echo in [id number] showed preserved EF 55-60% with septal bounce from LBBB. Mean gradient was elevated across aortic valve at 30 mmHg (though not markedly elevated), and there was suggestion of mild peri-valvular regurgitation at mitral position. On TEE in 8/20, EF 50% with septal-lateral dyssynchrony (c/w LBBB), normal-appearing RV, mechanical MV with mild peri-valvular regurgitation and mean gradient 5, mechanical AoV with mean gradient 23 mmHg. He has been difficult to manage with diuretic resistance.Dysfunction of his mechanical valves does not appear to explain his worsening CHF. 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  -Multiple hospital admissions over the last year for acute CHF. Would be a good candidate for cardiomems for closer outpatient monitoring of volume status to reduce risk of readmissions for CHF.  -For now, continue IV Lasix and keep  electrolyte stable given VT -Continue spironolactone -? Amyloidosis. Will need PYP scan   3. Valvular heart disease: Patient has mechanical mitral and aortic valves. TEE in 8/20 showed mechanical MV with mean gradient 5 mmHg, mild peri-valvular regurgitation. The aortic valve appeared to open reasonably well but mean gradient was 23 mmHg, suspect that there is a component of mild patient-prosthesis mismatch. No indication for any sort of intervention.  - Continue ASA 81 and warfarin with INR goal 2.5-3.5.    4. Atrial fibrillation: Chronic. Long-standing atrial fibrillation with severely dilated atria, unlikely to have a successful cardioversion. now on IV amiodarone for VT. Continue coumadin. Pharmacy to dose.   5. CAD: Last cath in 4/16 with patent native coronaries, probably occluded SVG-RCA. Now w/ VT. May need repeat cath.   6. Type 2 diabetes: SSI   7. OSA: Continue CPAP nightly.    Length of Stay: 1  Lyda Jester, PA-C  06/22/2019, 8:48 AM  Advanced Heart Failure Team Pager [phone] (M-F; 7a - 4p)  Please contact [facility] for night-coverage after hours (4p -7a ) and weekends on [email or web address]  Patient seen with PA, agree with the above note.   He was seen by Darrick Grinder in the office in early October, was doing well at that time on torsemide + metolazone twice weekly.  However, over the last couple of weeks, he has gradually developed worsening edema and increased weight.  Increased exertional dyspnea as well.  No chest pain, no palpitations or lightheadedness.  He was admitted yesterday with acute/chronic systolic CHF.  At baseline, he is in chronic atrial fibrillation with LBBB.   This morning, he went into VT with rates in the 200s.  He was awake/alert  with SBP 90s. I shocked him 3 times without conversion to NSR.  He received 300 mg bolus amiodarone then another 150 mg bolus as well as magnesium sulfate 2 g.  He never required CPR.  He converted back over to his  baseline atrial fibrillation after the 2nd bolus of amiodarone.   General: NAD Neck: JVP difficult but appears elevated, no thyromegaly or thyroid nodule.  Lungs: Decreased BS at bases.  CV: Nondisplaced PMI.  Heart irregular S1/S2 with crisp mechanical S1S2, 2/6 SEM RUSB.  1+ edema to knees.  No carotid bruit.  Normal pedal pulses.  Abdomen: Soft, nontender, no hepatosplenomegaly, no distention.  Skin: Intact without lesions or rashes.  Neurologic: Alert and oriented x 3.  Psych: Normal affect. Extremities: No clubbing or cyanosis.  HEENT: Normal.   1. Acute on chronic diastolic CHF: Last echo in 6/20 showed preserved EF 55-60% with septal bounce from LBBB. Mean gradient was elevated across aortic valve at 30 mmHg (though not markedly elevated), and there was suggestion of mild peri-valvular regurgitation at mitral position. On TEE last admission, EF 50% with septal-lateral dyssynchrony (c/w LBBB), normal-appearing RV, mechanical MV with mild peri-valvular regurgitation and mean gradient 5, mechanical AoV with mean gradient 23 mmHg.  He has permanent atrial fibrillation. Dysfunction of his mechanical valves does not appear to explain his worsening CHF this year. 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 Ceiba 04/07/19 showed mildly elevated filling pressures.He was doing well until earlier this month, then has developed progressive volume overload.  He remains volume overloaded this morning.  - Valves sounds remain crisp on exam but will get repeat echo to make sure no changes.   - Post-arrest, he will remain on Lasix gtt at 10 mg/hr with aggressive K repletion.  Will see how he diureses with this today.  - Post-arrest, will get CVL.  Can use to follow CVP.  -Continue spironolactone at 12.5 mg daily. - He is on midodrine 5 mg tid currently to maintain BP, will continue for now.  - Unna boots - Given restrictive cardiomyopathy picture, will work up for possible  cardiac amyloidosis.  Will send myeloma panel and urine immunofixation, will arrange for PYP scan for ATTR amyloidosis. Creatinine is going to be borderline for cardiac MRI, will not order today with VT.  2. Valvular heart disease: Patient has mechanical mitral and aortic valves. TEE in 8/20 showed mechanical MV with mean gradient 5 mmHg, mild peri-valvular regurgitation. The aortic valve appeared to open reasonably well but mean gradient was 23 mmHg, ?component of mild patient-prosthesis mismatch. No indication for any sort of intervention.  - Continue ASA 81 and warfarin with INR goal 2.5-3.5.   - He will continue home coumadin regimen. 3. Atrial fibrillation: Chronic. Long-standing atrial fibrillation with severely dilated atria, unlikely to have a successful cardioversion. - Rate control with amiodarone gtt post-arrest.  -Continue Toprol XL at current dose.  4. VT: Patient with VT this morning, rate 200s, monomorphic.  He has not had episodes of palpitations or dizziness at home prior to admission.  He did not lose consciousness or require CPR.  Cardioversion x 3 failed, he converted back to atrial fibrillation after amiodarone boluses and remains in atrial fibrillation currently.  No chest pain, think ACS is unlikely. K was low at 3.2 this morning.  Cath in 4/16 with no significant disease.  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  - Would workup for cardiac amyloidosis as  above.  - Continue amiodarone gtt for now.  - EP to see, ?ICD prior to discharge.  - Will cycle troponin.  5. CAD: Last cath in 4/16 with patent native coronaries, probably occluded SVG-RCA. Doubt CAD as cause of his decompensation.  No chest pain.  6. Type 2 diabetes: SSI.  7. OSA: CPAP. 8. Anemia, iron-deficient: received feraheme 02/22/2019 and again on 04/05/19 but had severe reaction to feraheme. Placed on allergy list.   Loralie Champagne 06/22/2019 9:52 AM

## 2019-06-22 NOTE — Progress Notes (Signed)
Orthopedic Tech Progress Note Patient Details:  Lance Cook 10-31-1960 [place]:[id number]  Ortho Devices Type of Ortho Device: Haematologist Ortho Device/Splint Interventions: Adjustment, Application, Vaughan Sine T 06/22/2019, 7:31 PM

## 2019-06-22 NOTE — Consult Note (Signed)
Cardiology Consultation:   Patient ID: Lance Cook MRN: [id number]; DOB: [birth year]  Admit date: 06/21/2019 Date of Consult: 06/22/2019  Primary Care Provider: [name], MD Primary Cardiologist: [name], MD  Primary Electrophysiologist:  None    Patient Profile:   Lance Cook is a 58 y.o. 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 [name].  History of Present Illness:   [name] is followed closely by AHF team, Dr. [name], of late with worsening CHF picture having a number of hospitalizations this year.  TEEin Aug also noted question of possible LAA thrombus, remains on ASA and warfarin.  He was last seen in HF clinic 06/02/2019, note mentions suspect restrictive CM as etiology of his worsening HF with stable VHD/prosthetics, preserved LVEF and RVEF, and doubt any significant change in his CAD with known occl SVG  Admitted to [facility] yesterday again with worsening HF symptoms, increasing SOB and edema, acute/chronic CHF exacerbation Lasix gtt started, BP meds held with lower BP readings  This morning he developed WCT unstable with hypotension, and shocked (x3).  Dr. [name] felt this was VT, not AFib w/BBB and started on amiodarone gtt, moved to [place] today K+ 3.2 >> being replaced Mag 2.6  BUN/Creat 70/1.52  Yesterday WBC 16.8 (afebrile) H/H 9.5/31.2 Plts 205 COVID negative  INR 2.2 > 2.0    Heart Pathway Score:     Past Medical History:  Diagnosis Date  . Acute on chronic diastolic CHF (congestive heart failure) ([place]) 08/27/2016  . Anemia   . Anxiety   . ARDS (adult respiratory distress syndrome) ([place]) 01/27/2015  . Arthritis   . Ascending aortic dissection ([place]) 07/14/2008   Localized dissection of ascending  aorta noted on CTA in 2009 and stable on CTA in 2011  . Asymptomatic chronic venous hypertension 01/15/2010   Overview:  Overview:  Qualifier: Diagnosis of  By: [name] MD, [name]   Last Assessment & Plan:  I suggested he go to a vein clinic to see if he could be qualified compression hose of some type, since he is not a surgical candidate according to the vascular surgeons.   . Atrial fibrillation ([place])    chronic persistent  . Bell's palsy   . C. difficile diarrhea   . CAD (coronary artery disease)    Old scar inferior wall myoview, 10/2009 EF 52%.  He did have previous SVG to RCA but no obstructive disease noted on his most recent cath.  SVG occluded.   . Cellulitis 04/02/2014  . Cerebral artery occlusion with cerebral infarction ([place]) 10/08/2011   Overview:  Overview:  And hx of TIA prior to CABG, all thought due to systemic emboli prior to coumadin   . Chronic diastolic heart failure ([place]) 03/01/2015   a. 12/2015: echo showing a preserved EF of 65-70%, moderate AS, and moderate TR.   Marland Kitchen Chronic LBP 10/08/2011  . CVA (cerebral vascular accident) ([place]) 10/08/2011   And hx of TIA prior to CABG, all thought due to systemic emboli prior to coumadin   . DIABETES MELLITUS, TYPE II 11/01/2009   Qualifier: Diagnosis of  By: [name] MD, [name]    . Diverticulosis   . 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 Encephalopathy acute 02/05/2015  . Gastric polyps   . GERD (gastroesophageal reflux disease)    not needing medication at thhis time- 01/22/15  . Gout   . H/O mechanical aortic valve replacement 11/01/2009   Qualifier: Diagnosis of  By: [name], [name]    . Hiatal hernia   . History of colon polyps 12/23/2011   Overview:  Overview:  Colonoscopy January 2010, 6 mm rectal tubulovillous adenoma. No high-grade dysplasia   . HTN (hypertension) 02/23/2014  .  Hyperlipidemia   . Hypertension   . Hypogonadism male 04/08/2012  . Impaired glucose tolerance 10/08/2011  . Morbid obesity ([place]) 04/02/2014  . Myocardial infarction [facility]) age 88  . OSA (obstructive sleep apnea)    CPAP  . Peripheral vascular disease ([place])   . 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 [id number]   33 mm St Jude bileaflet mechanical valve placed via right mini thoracotomy approach  . 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    1988 - Dr Blase Mess at [facility] in [place], [place]  . Severe mitral regurgitation 10/11/2014  . Testicular cancer [facility])    He was 58 y/o. He had surgical resection and rad tx's.   Marland Kitchen TIA (transient ischemic attack)    age 52  . Tubulovillous adenoma of colon   . Varicose veins   . 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 Venous (peripheral) insufficiency 02/03/2005    Past Surgical History:  Procedure Laterality Date  . aortic truck    . BENTALL PROCEDURE  1988   25 mm St Jude mechanical valve conduit - Dr Blase Mess at [facility] in [place], [place]  . BIOPSY  06/01/2018   Procedure: BIOPSY;  Surgeon: Jerene Bears, MD;  Location[facility];  Service: Gastroenterology;;  . Kathleen Argue STUDY  04/04/2019   Procedure: BUBBLE STUDY;  Surgeon: Larey Dresser, MD;  Location: [facility] ENDOSCOPY;  Service: Cardiovascular;;  . CARDIAC VALVE REPLACEMENT     [phone]  . CHOLECYSTECTOMY  2011  . COLONOSCOPY WITH PROPOFOL N/A 06/17/2016   Procedure: COLONOSCOPY WITH PROPOFOL;  Surgeon: Jerene Bears, MD;  Location: [facility] ENDOSCOPY;  Service: Gastroenterology;  Laterality: N/A;  . COLONOSCOPY WITH PROPOFOL N/A 06/01/2018   Procedure: COLONOSCOPY WITH PROPOFOL;  Surgeon: Jerene Bears, MD;  Location: [facility] ENDOSCOPY;  Service: Gastroenterology;  Laterality: N/A;  . CORONARY ARTERY BYPASS GRAFT    . LASER ABLATION Left 03/06/2010   leg  . MITRAL VALVE REPLACEMENT Right  01/24/2015   Procedure: Re-Operation, MINIMALLY INVASIVE MITRAL VALVE (MV) REPLACEMENT.;  Surgeon: Rexene Alberts, MD;  Location: [place];  Service: Open Heart Surgery;  Laterality: Right;  . ORCHIECTOMY  age 34   testicular cancer  . OTOPLASTY     bilateral, age 6  . POLYPECTOMY  06/01/2018   Procedure: POLYPECTOMY;  Surgeon: Jerene Bears, MD;  Location: [facility];  Service: Gastroenterology;;  . RIGHT HEART CATH N/A 04/07/2019   Procedure: RIGHT HEART CATH;  Surgeon: Larey Dresser, MD;  Location: [facility];  Service: Cardiovascular;  Laterality: N/A;  . TEE WITHOUT CARDIOVERSION N/A 10/11/2014   Procedure: TRANSESOPHAGEAL ECHOCARDIOGRAM (TEE);  Surgeon: Pixie Casino, MD;  Location[facility] ENDOSCOPY;  Service: Cardiovascular;  Laterality: N/A;  . TEE WITHOUT CARDIOVERSION N/A 01/24/2015   Procedure: TRANSESOPHAGEAL ECHOCARDIOGRAM (TEE);  Surgeon: Rexene Alberts, MD;  Location: [place];  Service: Open Heart Surgery;  Laterality: N/A;  . TEE WITHOUT CARDIOVERSION N/A 04/04/2019   Procedure: TRANSESOPHAGEAL ECHOCARDIOGRAM (TEE);  Surgeon: Larey Dresser, MD;  Location[facility] ENDOSCOPY;  Service: Cardiovascular;  Laterality: N/A;  . TONSILLECTOMY  1967  . TOOTH EXTRACTION  2016     Home Medications:  Prior to Admission medications   Medication Sig Start Date End Date Taking? Authorizing Provider  allopurinol (ZYLOPRIM) 300 MG tablet Take 300 mg by mouth daily. 11/17/12  Yes Biagio Borg, MD  aspirin EC 81 MG EC tablet Take 1 tablet (81 mg total) by mouth daily. 02/27/19  Yes [name], MD  gabapentin (NEURONTIN) 300 MG capsule Take 300 mg by mouth 3 (three) times daily.   Yes [provider]  magnesium oxide (MAG-OX) 400 MG tablet Take 400 mg by mouth 3 (three) times daily.   Yes [provider]  metoprolol succinate (TOPROL-XL) 25 MG 24 hr tablet Take 1 tablet (25 mg total) by mouth 2 (two) times daily. 04/11/19  Yes Clegg, Amy D, NP  midodrine (PROAMATINE) 5  MG tablet Take 1 tablet (5 mg total) by mouth 3 (three) times daily with meals. 04/11/19  Yes Clegg, Amy D, NP  mupirocin ointment (BACTROBAN) 2 % Apply 1 application topically 3 (three) times daily.    Yes [provider]  oxyCODONE (OXY IR/ROXICODONE) 5 MG immediate release tablet Take 1 tablet (5 mg total) by mouth every 8 (eight) hours as needed for severe pain. Patient taking differently: Take 5 mg by mouth 3 (three) times daily.  09/28/18 06/21/19 Yes King, Diona Foley, NP  potassium chloride SA (K-DUR) 20 MEQ tablet Take 3 tablets (60 mEq total) by mouth 3 (three) times daily. 05/24/19  Yes Clegg, Amy D, NP  PRESCRIPTION MEDICATION See admin instructions. CPAP- At bedtime and during any time of rest   Yes [provider]  silver sulfADIAZINE (SILVADENE) 1 % cream Apply 1 application topically daily as needed (for wound care- both legs).    Yes [provider]  spironolactone (ALDACTONE) 25 MG tablet Take 1 tablet (25 mg total) by mouth daily. 04/11/19  Yes Clegg, Amy D, NP  traZODone (DESYREL) 100 MG tablet Take 100 mg by mouth at bedtime.  01/24/19  Yes [provider]  warfarin (COUMADIN) 5 MG tablet 5 mg daily, and 2.5 mg on Mondays Patient taking differently: Take 5 mg by mouth daily at 6 PM. 5 mg last dose 02/27/19  Yes [name], MD  warfarin (COUMADIN) 5 MG tablet Take 5 mg by mouth daily.   Yes [provider]  methocarbamol (ROBAXIN) 500 MG tablet Take 1 tablet (500 mg total) by mouth 3 (three) times daily. 07/30/18 05/12/19  Vevelyn Francois, NP  metolazone (ZAROXOLYN) 5 MG tablet Take 1 tablet by mouth every Monday and Friday 04/25/19   Darrick Grinder D, NP  torsemide (DEMADEX) 20 MG tablet Take 4 tablets (80 mg total) by mouth 2 (two) times daily. 05/17/19   Larey Dresser, MD    Inpatient Medications: Scheduled Meds: . allopurinol  300 mg Oral Daily  . amiodarone  150 mg Intravenous Once  . aspirin  81 mg Oral Daily  . Chlorhexidine  Gluconate Cloth  6 each Topical Daily  . fentaNYL      . furosemide  40 mg Intravenous Once  . gabapentin  300 mg Oral TID  . Gerhardt's butt cream  Topical TID  . magnesium oxide  400 mg Oral TID  . metoprolol succinate  12.5 mg Oral BID  . midazolam      . midodrine  10 mg Oral TID WC  . mupirocin ointment  1 application Topical TID  . potassium chloride  40 mEq Oral Q4H  . sodium chloride flush  3 mL Intravenous Once  . spironolactone  25 mg Oral Daily  . traZODone  100 mg Oral QHS  . warfarin  10 mg Oral ONCE-1800  . Warfarin - Pharmacist Dosing Inpatient   Does not apply q1800   Continuous Infusions: . amiodarone     Followed by  . amiodarone    . furosemide (LASIX) infusion Stopped (06/22/19 0908)  . potassium chloride     PRN Meds: acetaminophen, ALPRAZolam, nitroGLYCERIN, ondansetron (ZOFRAN) IV, oxyCODONE, perflutren lipid microspheres (DEFINITY) IV suspension, zolpidem  Allergies:    Allergies  Allergen Reactions  . Feraheme [Ferumoxytol] Shortness Of Breath    SOB/Tachycardia  . Penicillins Hives and Rash    Has patient had a PCN reaction causing immediate rash, facial/tongue/throat swelling, SOB or lightheadedness with hypotension: Yes Has patient had a PCN reaction causing severe rash involving mucus membranes or skin necrosis: Yes Has patient had a PCN reaction that required hospitalization unsure Has patient had a PCN reaction occurring within the last 10 years: 2010-2012 If all of the above answers are "NO", then may proceed with Cephalosporin use.  [name] Other (See Comments)    CDIFF, when taking oral tablets   . Tape Other (See Comments) and Itching    Skin irritation    Social History:   Social History   Socioeconomic History  . Marital status: Married    Spouse name: Not on file  . Number of children: 0  . Years of education: Not on file  . Highest education level: Not on file  Occupational History  . Occupation:  Disabled Environmental education officer: UNEMPLOYED  Social Needs  . Financial resource strain: Not on file  . Food insecurity    Worry: Not on file    Inability: Not on file  . Transportation needs    Medical: Not on file    Non-medical: Not on file  Tobacco Use  . Smoking status: Former Smoker    Types: Cigars  . Smokeless tobacco: Never Used  . Tobacco comment: about 3 yearly- cigar  Substance and Sexual Activity  . Alcohol use: Yes    Alcohol/week: 0.0 standard drinks    Comment: social  . Drug use: No  . Sexual activity: Not on file  Lifestyle  . Physical activity    Days per week: Not on file    Minutes per session: Not on file  . Stress: Not on file  Relationships  . Social Herbalist on phone: Not on file    Gets together: Not on file    Attends religious service: Not on file    Active member of club or organization: Not on file    Attends meetings of clubs or organizations: Not on file    Relationship status: Not on file  . Intimate partner violence    Fear of current or ex partner: Not on file    Emotionally abused: Not on file    Physically abused: Not on file    Forced sexual activity: Not on file  Other Topics Concern  . Not on file  Social History Narrative  . Not  on file    Family History:    Family History  Problem Relation Age of Onset  . Heart disease Mother   . Throat cancer Mother   . Other Mother        bowel obstruction  . Diabetes Father   . Stroke Other   . Hypertension Other   . Cancer Maternal Aunt        lung, brain  . Cancer Maternal Uncle        brain aneurysm  . Colon cancer Neg Hx      ROS:  Please see the history of present illness.   All other ROS reviewed and negative.     Physical Exam/Data:   Vitals:   06/22/19 0922 06/22/19 0931 06/22/19 1000 06/22/19 1113  BP: 102/70 110/74 112/79   Pulse: 99 98 (!) 104   Resp:   (!) 24   Temp:    99.3 F (37.4 C)  TempSrc:    Oral  SpO2: 100%  100%   Weight:       Height:        Intake/Output Summary (Last 24 hours) at 06/22/2019 1147 Last data filed at 06/22/2019 1000 Gross per 24 hour  Intake 582.64 ml  Output 2975 ml  Net -2392.36 ml   Last 3 Weights 06/22/2019 06/21/2019 06/21/2019  Weight (lbs) 189 lb 9.5 oz 191 lb 169 lb 12.1 oz  Weight (kg) 86 kg 86.637 kg 77 kg     Body mass index is 37.03 kg/m.  General:  Critically ill man, stable but ill. HEENT: normal Lymph: no adenopathy Neck: 8 cm JVD Endocrine:  No thryomegaly Vascular: No carotid bruits; FA pulses 2+ bilaterally without bruits  Cardiac:  Mechanical S1/S2, IRIR Lungs:  Rales bilaterally Abd: soft, nontender, no hepatomegaly  Ext: 3+ peripheral edema Musculoskeletal:  No deformities, BUE and BLE strength normal and equal Skin: warm and dry  Neuro:  CNs 2-12 intact, no focal abnormalities noted Psych:  Normal affect   EKG:  The EKG was personally reviewed and demonstrates:    AFib 105bpm, LBBB, PVC AFib 105bpm, LBBB, PVCs (multifocal)  05/12/2019 AFib 88, LBBB  06/22/19 VT at 215/min with RBBB, norhtwest axis, transition from positive to negative at V4.   Telemetry:  Telemetry was personally reviewed and demonstrates:  Atrial fib with a RVR and LBBB  Relevant CV Studies:  RHC Aug 2020 Hemodynamics (mmHg) RA mean 11 RV 46/6 PA 50/21, mean 33 PCWP mean 19  Oxygen saturations: PA 69% AO 100%  Cardiac Output (Fick) 6.24  Cardiac Index (Fick) 3.32 PVR 2.2 WU PAPI 2.63 CVP/PCWP 0.59   04/04/2019: TEE IMPRESSIONS  1. Left atrial size was severely dilated. Smoke in the left atrium. Cannot rule out a small LA appendage thrombus (at tip of LAA).  2. Right atrial size was mildly dilated.  3. No PFO/ASD, negative bubble study.  4. Tricuspid valve regurgitation is mild-moderate. Peak RV-RA gradient 45 mmHg.  5. Mechanical mitral valve. There was mild peri-valvular regurgitation. Mean gradient across valve of 5 mmHg did not suggest significant stenosis.  6.  Mechanical aortic valve. No significant regurgitation. Mean gradient 23 mmHg. The mean gradient is higher than expected but does not suggest severe stenosis. Visually, the valve appears to open well.  7. The left ventricle had a visually estimated ejection fraction of of 50%. The cavity size was normal. There was prominent septal-lateral dyssynchrony.  8. The right ventricle has normal systolc function. The cavity was normal.  FINDINGS  Left Ventricle: The left ventricle has a visually estimated ejection fraction of of 50%. The cavity size was normal. There is no increase in left ventricular wall thickness.  Right Ventricle: The right ventricle has normal systolic function. The cavity was normal.  Left Atrium: Left atrial size was severely dilated.   Right Atrium: Right atrial size was mildly dilated. Right atrial pressure is estimated at 10 mmHg.  Interatrial Septum: No atrial level shunt detected by color flow Doppler.  Mitral Valve: The mitral valve has been repaired/replaced.  Tricuspid Valve: Tricuspid valve regurgitation is mild-moderate by color flow Doppler.  Aortic Valve: The aortic valve has been repaired/replaced.  Pulmonic Valve: The pulmonic valve was grossly normal.   Echo 01/31/19 1. The left ventricle has normal systolic function, with an ejection fraction of 55-60%. The cavity size was normal. Left ventricular diastolic function could not be evaluated due to mitral valve replacement/repair. There is abnormal septal motion  consistent with left bundle branch block. 2. Mild hypokinesis of the left ventricular, entire septal wall. 3. The right ventricle has normal systolic function. The cavity was normal. There is no increase in right ventricular wall thickness. Right ventricular systolic pressure is severely elevated with an estimated pressure of 62.4 mmHg. 4. Left atrial size was severely dilated. 5. Right atrial size was severely dilated. 6. A 33 St.  Jude mechanical valve is present in the mitral position. Procedure Date: 01/24/2015 There is no Echo evidence of rocking of the mitral prosthesis. Echo findings are consistent with perivalvular leak the mitral prosthesis. 7. Mitral valve regurgitation mild perivalvular. Mitral valve gradient appropriate for prosthesis mitral valve stenosis. 8. The tricuspid valve is grossly normal. Tricuspid valve regurgitation is moderate. 9. A 88mm St. Jude mechanical prosthesis valve is present in the aortic position. Normal aortic valve prosthesis. Echo findings are consistent with Mean gradient ~30 mmHg of the aortic prosthesis. 10. The aortic arch is normal in size and structure. 11. There is dilatation of the ascending aorta measuring 38 mm. 12. Post Bentall 1988. 13. The inferior vena cava was dilated in size with <50% respiratory variability.   Laboratory Data:  High Sensitivity Troponin:   Recent Labs  Lab 06/21/19 1507 06/21/19 1752 06/22/19 1030  TROPONINIHS 35* 34* 22*     Chemistry Recent Labs  Lab 06/21/19 1507 06/22/19 0439  NA 122* 126*  K 4.7 3.2*  CL 86* 89*  CO2 27 26  GLUCOSE 76 79  BUN 71* 70*  CREATININE 1.73* 1.52*  CALCIUM 8.2* 8.3*  GFRNONAA 43* 50*  GFRAA 50* 58*  ANIONGAP 9 11    Recent Labs  Lab 06/21/19 1752  PROT 5.2*  ALBUMIN 1.8*  AST 27  ALT 11  ALKPHOS 64  BILITOT 1.0   Hematology Recent Labs  Lab 06/21/19 1507  WBC 16.8*  RBC 4.25  HGB 9.5*  HCT 31.2*  MCV 73.4*  MCH 22.4*  MCHC 30.4  RDW 19.5*  PLT 205   BNP Recent Labs  Lab 06/21/19 1752  BNP 381.5*    DDimer No results for input(s): DDIMER in the last 168 hours.   Radiology/Studies:   Dg Chest 2 View Result Date: 06/21/2019 CLINICAL DATA:  Chest pain. History of testicular carcinoma. Hypertension. EXAM: CHEST - 2 VIEW COMPARISON:  April 05, 2019 FINDINGS: There is no appreciable edema or consolidation. There is cardiomegaly with central pulmonary prominence with  apparent rapid peripheral tapering. Patient is status post mitral valve replacement. No adenopathy evident. No pneumothorax. No bone lesions.  IMPRESSION: Cardiomegaly with apparent degree of pulmonary arterial hypertension. Status post mitral valve replacement. No edema or consolidation. No evident adenopathy. Electronically Signed   By: Lowella Grip III M.D.   On: 06/21/2019 15:54   Assessment and Plan:   1. VT     In review of record, his worsening HF picture is suspect to have developed a restrictive CM picture  Dr. [name] has seen and examined the patient, evaluate rhythm/EKGs     This would be etiology of his VT     Agree with K+ and Mag supplementation as needed and addition of amiodarone     He will need ICD prior to discharge     Will be OK to keep INR therapeutic, though not higher then 3.0 for implant  Continue primary care to AHF team, primary cardiology team  For questions or updates, please contact [facility] Please consult [email or web address] for contact info under   Signed, Baldwin Jamaica, PA-C  06/22/2019 11:47 AM   EP Attending  Patient seen and examined. Agree with above. He has presented with worsening CHF. He has been treated with IV lasix. Early today he had VT with hypotension and was unfortunately shocked 3 times. He has been transferred to the ICU. He was noted to be hypokalemic. Review of his 12 lead in ECG demonstrates a Right bundle, northwest axis VT, with transition from positive to negative at V4 at over 200/min.   A/P 1. Sustained hemodynamically unstable VT - the patient has a RBBB northwest axis VT, likely originating in the mid portion of the RV septum. I would suggest continuing IV amiodarone. I am not sure cardiac MRI would add much as we know he has scarring with his extensive valvular heart disease. I would recommend insertion of an ICD. With only mild LV dysfunction, not likely to be much benefit despite LBBB for a biv device.   2. Chronic  systolic/diastolic heart failure - He has massive peripheral edema. He needs some diuresis prior to device insertion.   3. Atrial fib - this appears to be chronic and his rate is controlled. He has extensive atriopathy and I do not think maintenance of NSR an option.  Mikle Bosworth.D.

## 2019-06-22 NOTE — Progress Notes (Addendum)
Report given to receiving RN.

## 2019-06-22 NOTE — Consult Note (Addendum)
[place] Nurse wound consult note Pt is familiar to [facility] team from previous admissions.  He states he wears bilat Una boots prior to admission and they are changed weekly. Pt states most of the previous wounds and weeping has resolved with the use of the compression wraps.  Wound type: BLE with generalized edema;  left leg with healing full thickness wound; 1X.3X.2cm, dry yellow wound bed, no odor or drainage Right leg with healing full thickness wound; 1X.2X.2cm, dry yellow wound bed, no odor or drainage Dressing procedure/placement/frequency: Ortho tech to apply bilat Una boots and change Q Wed.  Pt should resume follow-up with home health for [facility] boot changes after discharge.  Please re-consult if further assistance is needed.  Thank-you,  Julien Girt MSN, [place], [place], [place], [place]

## 2019-06-22 NOTE — Progress Notes (Signed)
Peripherally Inserted Central Catheter/Midline Placement  The IV Nurse has discussed with the patient and/or persons authorized to consent for the patient, the purpose of this procedure and the potential benefits and risks involved with this procedure.  The benefits include less needle sticks, lab draws from the catheter, and the patient may be discharged home with the catheter. Risks include, but not limited to, infection, bleeding, blood clot (thrombus formation), and puncture of an artery; nerve damage and irregular heartbeat and possibility to perform a PICC exchange if needed/ordered by physician.  Alternatives to this procedure were also discussed.  Bard Power PICC patient education guide, fact sheet on infection prevention and patient information card has been provided to patient /or left at bedside.    PICC/Midline Placement Documentation  PICC Triple Lumen 06/22/19 PICC Right Cephalic 40 cm 0 cm (Active)  Indication for Insertion or Continuance of Line Vasoactive infusions 06/22/19 1300  Exposed Catheter (cm) 0 cm 06/22/19 1300  Site Assessment Clean;Dry;Intact 06/22/19 1300  Lumen #1 Status Flushed;Saline locked;Blood return noted 06/22/19 1300  Lumen #2 Status Flushed;Saline locked;Blood return noted 06/22/19 1300  Lumen #3 Status Flushed;Saline locked;Blood return noted 06/22/19 1300  Dressing Type Transparent;Securing device 06/22/19 1300  Dressing Status Clean;Dry;Intact;Antimicrobial disc in place 06/22/19 1300  Line Care Connections checked and tightened 06/22/19 1300  Dressing Intervention New dressing;Other (Comment) 06/22/19 1300  Dressing Change Due 06/22/19 06/22/19 1300    Patient gave written consent   Virgilio Belling 06/22/2019, 1:07 PM

## 2019-06-22 NOTE — Progress Notes (Signed)
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 Feraheme [Ferumoxytol] Shortness Of Breath    SOB/Tachycardia  . Penicillins Hives and Rash    Has patient had a PCN reaction causing immediate rash, facial/tongue/throat swelling, SOB or lightheadedness with hypotension: Yes Has patient had a PCN reaction causing severe rash involving mucus membranes or skin necrosis: Yes Has patient had a PCN reaction that required hospitalization unsure Has patient had a PCN reaction occurring within the last 10 years: 2010-2012 If all of the above answers are "NO", then may proceed with Cephalosporin use.  [name] Other (See Comments)    CDIFF, when taking oral tablets   . Tape Other (See Comments) and Itching    Skin irritation    Patient Measurements: Height: 5' (152.4 cm) Weight: 189 lb 9.5 oz (86 kg) IBW/kg (Calculated) : 50   Vital Signs: Temp: 98 F (36.7 C) (10/21 0600) Temp Source: Oral (10/21 0600) BP: 94/62 (10/21 0700) Pulse Rate: 109 (10/21 0600)  Labs: Recent Labs    06/21/19 1507 06/21/19 1752 06/22/19 0439  HGB 9.5*  --   --   HCT 31.2*  --   --   PLT 205  --   --   LABPROT  --  24.4* 22.1*  INR  --  2.2* 2.0*  CREATININE 1.73*  --  1.52*  TROPONINIHS 35* 34*  --     Estimated Creatinine Clearance: 48.8 mL/min (A) (by C-G formula based on SCr of 1.52 mg/dL (H)).   Assessment: 58 y.o. 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   Home dose is 5mg  daily, last taken 10/19. INR slightly subtherapeutic at 2.2 today. INR fluctuating outpatient - was therapeutic on 10/13 at 2.6 but INR 1.2 on 10/6 and 2.6 on 9/29.   Patient went into VT during morning rounds, shocked x3 but patient awake and alert during  entire code. Transferred to ICU.   INR down slightly to 2.0 this morning, will give extra warfarin again tonight. Started on IV amiodarone this morning, will need to watch INR closely.   Goal of Therapy:  INR 2.5 - 3.5 Monitor platelets by anticoagulation protocol: Yes   Plan:  Give warfarin 10mg  again tonight Daily INR Monitor for bleeding   Erin Hearing PharmD., BCPS Clinical Pharmacist 06/22/2019 10:15 AM

## 2019-06-22 NOTE — Progress Notes (Signed)
RN paged MD on call for Cardiology//Stemi

## 2019-06-22 NOTE — Progress Notes (Signed)
Pt found in RVR HR <200. Pt. AA0x3 MD at bedside during episode. Refer to Code sheet.

## 2019-06-22 NOTE — Progress Notes (Signed)
  Echocardiogram 2D Echocardiogram with definity has been performed.  Lance Cook M 06/22/2019, 11:50 AM

## 2019-06-22 NOTE — Progress Notes (Signed)
Rose, MD called and notified of patient K+ of 2.9 and CBG 255. Orders to give 65mEq of K+ PO and 4 runs of K+ through IV. Orders for ACHS CBG checks and sliding scale.

## 2019-06-22 NOTE — Significant Event (Signed)
Rapid Response Event Note  Overview: Time Called: 0903 Arrival Time: 0905 Event Type: Cardiac  Initial Focused Assessment: Patient in VT rate 200.  Patient is alert and oriented.  Dr [name] at bedside  Interventions: Shocked x 3, no change in rhythym 300 mg Amiodarone bolus given IV 2gm Mag given IV Patient continues to be alert and oriented. Dr [name] planned for additional cardioversion, but pt converted to RAF/Fl  Additional 150 mg Amiodarone given Amiodarone gtt started 60meq KCL given IV over an hour  Patient tearful and states he hurts after the defibrillation and is fearful of being shocked again.   BP 100-115/70-80  AF 104  RR 24  O2 sat 100% on [place]  Transported to 2H02 via bed with zoll Staff at bedside to receive patient.    Plan of Care (if not transferred):  Event Summary: Name of Physician Notified: Dr [name] at      at    Outcome: Transferred (Comment)  Event End Time: [place]  Raliegh Ip

## 2019-06-23 DIAGNOSIS — I5023 Acute on chronic systolic (congestive) heart failure: Secondary | ICD-10-CM

## 2019-06-23 DIAGNOSIS — I472 Ventricular tachycardia: Secondary | ICD-10-CM | POA: Diagnosis not present

## 2019-06-23 LAB — PROTIME-INR
INR: 3.4 — ABNORMAL HIGH (ref 0.8–1.2)
INR: 3.8 — ABNORMAL HIGH (ref 0.8–1.2)
Prothrombin Time: 33.8 seconds — ABNORMAL HIGH (ref 11.4–15.2)
Prothrombin Time: 36.7 seconds — ABNORMAL HIGH (ref 11.4–15.2)

## 2019-06-23 LAB — BASIC METABOLIC PANEL
Anion gap: 9 (ref 5–15)
Anion gap: 13 (ref 5–15)
BUN: 60 mg/dL — ABNORMAL HIGH (ref 6–20)
BUN: 55 mg/dL — ABNORMAL HIGH (ref 6–20)
CO2: 28 mmol/L (ref 22–32)
CO2: 30 mmol/L (ref 22–32)
Calcium: 7.7 mg/dL — ABNORMAL LOW (ref 8.9–10.3)
Calcium: 7.6 mg/dL — ABNORMAL LOW (ref 8.9–10.3)
Chloride: 88 mmol/L — ABNORMAL LOW (ref 98–111)
Chloride: 84 mmol/L — ABNORMAL LOW (ref 98–111)
Creatinine, Ser: 1.28 mg/dL — ABNORMAL HIGH (ref 0.61–1.24)
Creatinine, Ser: 1.35 mg/dL — ABNORMAL HIGH (ref 0.61–1.24)
GFR calc Af Amer: >60 mL/min (ref >60)
GFR calc Af Amer: >60 mL/min (ref >60)
GFR calc non Af Amer: >60 mL/min (ref >60)
GFR calc non Af Amer: 58 mL/min — ABNORMAL LOW (ref >60)
Glucose, Bld: 185 mg/dL — ABNORMAL HIGH (ref 70–99)
Glucose, Bld: 248 mg/dL — ABNORMAL HIGH (ref 70–99)
Potassium: 3.7 mmol/L (ref 3.5–5.1)
Potassium: 3.3 mmol/L — ABNORMAL LOW (ref 3.5–5.1)
Sodium: 125 mmol/L — ABNORMAL LOW (ref 135–145)
Sodium: 127 mmol/L — ABNORMAL LOW (ref 135–145)

## 2019-06-23 LAB — MRSA PCR SCREENING: MRSA by PCR: NEGATIVE

## 2019-06-23 LAB — GLUCOSE, CAPILLARY
Glucose-Capillary: 96 mg/dL (ref 70–99)
Glucose-Capillary: 92 mg/dL (ref 70–99)
Glucose-Capillary: 109 mg/dL — ABNORMAL HIGH (ref 70–99)
Glucose-Capillary: 75 mg/dL (ref 70–99)
Glucose-Capillary: 92 mg/dL (ref 70–99)

## 2019-06-23 LAB — COOXEMETRY PANEL
Carboxyhemoglobin: 1.3 % (ref 0.5–1.5)
Methemoglobin: 1 % (ref 0.0–1.5)
O2 Saturation: 64.7 %
Total hemoglobin: 10.4 g/dL — ABNORMAL LOW (ref 12.0–16.0)

## 2019-06-23 LAB — SURGICAL PCR SCREEN
MRSA, PCR: NEGATIVE
Staphylococcus aureus: NEGATIVE

## 2019-06-23 LAB — SODIUM: Sodium: 128 mmol/L — ABNORMAL LOW (ref 135–145)

## 2019-06-23 LAB — MAGNESIUM: Magnesium: 2.2 mg/dL (ref 1.7–2.4)

## 2019-06-23 MED ORDER — POTASSIUM CHLORIDE CRYS ER 20 MEQ PO TBCR
40.0000 meq | EXTENDED_RELEASE_TABLET | ORAL | Status: AC
  Administered 2019-06-23 (×3): 40 meq via ORAL
  Filled 2019-06-23 (×3): qty 2

## 2019-06-23 MED ORDER — SODIUM CHLORIDE 0.9 % IV SOLN
80.0000 mg | INTRAVENOUS | Status: AC
  Filled 2019-06-23: qty 2

## 2019-06-23 MED ORDER — SODIUM CHLORIDE 0.9 % IV SOLN
INTRAVENOUS | Status: DC
  Administered 2019-06-23: via INTRAVENOUS

## 2019-06-23 MED ORDER — CHLORHEXIDINE GLUCONATE 4 % EX LIQD
60.0000 mL | Freq: Once | CUTANEOUS | Status: AC
  Administered 2019-06-23: 4 via TOPICAL
  Filled 2019-06-23: qty 15

## 2019-06-23 MED ORDER — WARFARIN SODIUM 5 MG PO TABS: 5.0000 mg | ORAL_TABLET | Freq: Once | ORAL | Status: DC

## 2019-06-23 MED ORDER — POTASSIUM CHLORIDE CRYS ER 20 MEQ PO TBCR
40.0000 meq | EXTENDED_RELEASE_TABLET | Freq: Once | ORAL | Status: AC
  Administered 2019-06-23: 40 meq via ORAL
  Filled 2019-06-23: qty 2

## 2019-06-23 MED ORDER — VANCOMYCIN HCL IN DEXTROSE 1-5 GM/200ML-% IV SOLN: 1000.0000 mg | INTRAVENOUS | Status: AC

## 2019-06-23 MED ORDER — CHLORHEXIDINE GLUCONATE 4 % EX LIQD
60.0000 mL | Freq: Once | CUTANEOUS | Status: AC
  Filled 2019-06-23: qty 45

## 2019-06-23 MED ORDER — TOLVAPTAN 15 MG PO TABS
15.0000 mg | ORAL_TABLET | Freq: Once | ORAL | Status: AC
  Administered 2019-06-23: 13:00:00 15 mg via ORAL
  Filled 2019-06-23: qty 1

## 2019-06-23 MED FILL — Medication: Qty: 1 | Status: AC

## 2019-06-23 NOTE — Progress Notes (Addendum)
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 Feraheme [Ferumoxytol] Shortness Of Breath    SOB/Tachycardia  . Penicillins Hives and Rash    Has patient had a PCN reaction causing immediate rash, facial/tongue/throat swelling, SOB or lightheadedness with hypotension: Yes Has patient had a PCN reaction causing severe rash involving mucus membranes or skin necrosis: Yes Has patient had a PCN reaction that required hospitalization unsure Has patient had a PCN reaction occurring within the last 10 years: 2010-2012 If all of the above answers are "NO", then may proceed with Cephalosporin use.  [name] Other (See Comments)    CDIFF, when taking oral tablets   . Tape Other (See Comments) and Itching    Skin irritation    Patient Measurements: Height: 5' (152.4 cm) Weight: 184 lb 8.4 oz (83.7 kg) IBW/kg (Calculated) : 50   Vital Signs: Temp: 97.7 F (36.5 C) (10/22 0748) Temp Source: Oral (10/22 0748) BP: 110/56 (10/22 0800) Pulse Rate: 71 (10/22 0800)  Labs: Recent Labs    06/21/19 1507 06/21/19 1752 06/22/19 0439 06/22/19 1030 06/22/19 1954 06/23/19 0405  HGB 9.5*  --   --   --   --   --   HCT 31.2*  --   --   --   --   --   PLT 205  --   --   --   --   --   LABPROT  --  24.4* 22.1*  --   --  33.8*  INR  --  2.2* 2.0*  --   --  3.4*  CREATININE 1.73*  --  1.52*  --  1.52* 1.28*  TROPONINIHS 35* 34*  --  22* 30*  --     Estimated Creatinine Clearance: 57.2 mL/min (A) (by C-G formula based on SCr of 1.28 mg/dL (H)).   Assessment: 58 y.o. 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   Home dose is 5mg  daily, last taken 10/19. INR fluctuating outpatient - was therapeutic on 10/13 at 2.6 but INR 1.2 on 10/6 and 2.6 on 9/29.    INR up 3.4 this morning. Started on IV amiodarone 10/21 for VT, will need to watch INR closely.   Addendum: Pt on the schedule for ICD implant tomorrow. Repeat INR this afternoon is 3.8  Goal of Therapy:  INR 2.5 - 3.5 Monitor platelets by anticoagulation protocol: Yes   Plan:  Hold warfarin tonight in anticipation for ICD placement tomorrow Ideal INR < 3 for procedure Daily INR Monitor for bleeding   [name], PharmD PGY2 Cardiology Pharmacy Resident Phone [phone] 06/23/2019       8:36 AM  Please check [email or web address] for unit-specific pharmacist phone numbers

## 2019-06-23 NOTE — Progress Notes (Addendum)
Progress Note  Patient Name: Lance Cook Date of Encounter: 06/23/2019  Primary Cardiologist: [name], MD   Subjective   No CP, denies SOB, says he generally never feels too SOB.  Inpatient Medications    Scheduled Meds: . allopurinol  300 mg Oral Daily  . amiodarone  150 mg Intravenous Once  . aspirin  81 mg Oral Daily  . Chlorhexidine Gluconate Cloth  6 each Topical Daily  . furosemide  40 mg Intravenous Once  . gabapentin  300 mg Oral TID  . Gerhardt's butt cream   Topical TID  . insulin aspart  0-15 Units Subcutaneous TID WC  . insulin aspart  0-5 Units Subcutaneous QHS  . magnesium oxide  400 mg Oral TID  . metoprolol succinate  12.5 mg Oral BID  . midodrine  10 mg Oral TID WC  . mupirocin ointment  1 application Topical TID  . potassium chloride  40 mEq Oral Once  . sodium chloride flush  10-40 mL Intracatheter Q12H  . sodium chloride flush  3 mL Intravenous Once  . spironolactone  25 mg Oral Daily  . tolvaptan  15 mg Oral Once  . traZODone  100 mg Oral QHS  . Warfarin - Pharmacist Dosing Inpatient   Does not apply q1800   Continuous Infusions: . amiodarone 30 mg/hr (06/23/19 0700)  . furosemide (LASIX) infusion 10 mg/hr (06/23/19 0700)   PRN Meds: acetaminophen, ALPRAZolam, nitroGLYCERIN, ondansetron (ZOFRAN) IV, oxyCODONE, sodium chloride flush, zolpidem   Vital Signs    Vitals:   06/23/19 0600 06/23/19 0700 06/23/19 0748 06/23/19 0800  BP: 99/67 95/63  (!) 110/56  Pulse: 83 85  71  Resp: 19 17  (!) 23  Temp:   97.7 F (36.5 C)   TempSrc:   Oral   SpO2: 100% 96%  (!) 76%  Weight: 83.7 kg     Height:        Intake/Output Summary (Last 24 hours) at 06/23/2019 0824 Last data filed at 06/23/2019 0700 Gross per 24 hour  Intake 728.53 ml  Output 4600 ml  Net -3871.47 ml   Last 3 Weights 06/23/2019 06/22/2019 06/21/2019  Weight (lbs) 184 lb 8.4 oz 189 lb 9.5 oz 191 lb  Weight (kg) 83.7 kg 86 kg 86.637 kg      Telemetry    AFib 80's, one  NSVT - Personally Reviewed  ECG    No new EKGs - Personally Reviewed  Physical Exam   GEN: No acute distress, chronically ill appearing   Neck: No JVD Cardiac: irreg-irreg, valves appreciated Respiratory: decreased at the bases. GI: Soft, nontender, non-distended  MS: b/l LE w/unna boots. Neuro:  Nonfocal  Psych: Normal affect   Labs    High Sensitivity Troponin:   Recent Labs  Lab 06/21/19 1507 06/21/19 1752 06/22/19 1030 06/22/19 1954  TROPONINIHS 35* 34* 22* 30*      Chemistry Recent Labs  Lab 06/21/19 1752 06/22/19 0439 06/22/19 1954 06/23/19 0405  NA  --  126* 123* 125*  K  --  3.2* 2.9* 3.7  CL  --  89* 84* 88*  CO2  --  26 26 28   GLUCOSE  --  79 246* 185*  BUN  --  70* 65* 60*  CREATININE  --  1.52* 1.52* 1.28*  CALCIUM  --  8.3* 7.6* 7.7*  PROT 5.2*  --   --   --   ALBUMIN 1.8*  --   --   --   AST 27  --   --   --  ALT 11  --   --   --   ALKPHOS 64  --   --   --   BILITOT 1.0  --   --   --   GFRNONAA  --  50* 50* >60  GFRAA  --  58* 58* >60  ANIONGAP  --  11 13 9      Hematology Recent Labs  Lab 06/21/19 1507  WBC 16.8*  RBC 4.25  HGB 9.5*  HCT 31.2*  MCV 73.4*  MCH 22.4*  MCHC 30.4  RDW 19.5*  PLT 205    BNP Recent Labs  Lab 06/21/19 1752  BNP 381.5*     DDimer No results for input(s): DDIMER in the last 168 hours.   Radiology    nm Cardiac Amyloid Tumor Loc Inflam Spect 1 Day Result Date: 06/22/2019 CLINICAL DATA:  HEART FAILURE. CONCERN FOR CARDIAC AMYLOIDOSIS. EXAM: NUCLEAR MEDICINE TUMOR LOCALIZATION. PYP CARDIAC AMYLOIDOSIS SCAN WITH SPECT TECHNIQUE: Following intravenous administration of radiopharmaceutical, anterior planar images of the chest were obtained. Regions of interest were placed on the heart and contralateral chest wall for quantitative assessment. Additional SPECT imaging of the chest was obtained. RADIOPHARMACEUTICALS:  20.0 mCi TECHNETIUM 99 PYROPHOSPHATE FINDINGS: Planar Visual assessment: Anterior  planar imaging demonstrates radiotracer uptake within the heart less than uptake within the adjacent ribs (Grade 0). Quantitative assessment : Quantitative assessment of the cardiac uptake compared to the contralateral chest wall is equal to (H/CL = 1.1). SPECT assessment: SPECT imaging of the chest demonstrates no radiotracer accumulation within the LEFT ventricle. IMPRESSION: Visual and quantitative assessment (grade 0, H/CLL equal 1.1) are equivocal for transthyretin amyloidosis. Electronically Signed   By: Kerby Moors M.D.   On: 06/22/2019 16:47      Cardiac Studies   RHCAug 2020 Hemodynamics (mmHg) RA mean 11 RV 46/6 PA 50/21, mean 33 PCWP mean 19  Oxygen saturations: PA 69% AO 100%  Cardiac Output (Fick) 6.24  Cardiac Index (Fick) 3.32 PVR 2.2 WU PAPI 2.63 CVP/PCWP 0.59   04/04/2019: TEE IMPRESSIONS 1. Left atrial size was severely dilated. Smoke in the left atrium. Cannot rule out a small LA appendage thrombus (at tip of LAA). 2. Right atrial size was mildly dilated. 3. No PFO/ASD, negative bubble study. 4. Tricuspid valve regurgitation is mild-moderate. Peak RV-RA gradient 45 mmHg. 5. Mechanical mitral valve. There was mild peri-valvular regurgitation. Mean gradient across valve of 5 mmHg did not suggest significant stenosis. 6. Mechanical aortic valve. No significant regurgitation. Mean gradient 23 mmHg. The mean gradient is higher than expected but does not suggest severe stenosis. Visually, the valve appears to open well. 7. The left ventricle had a visually estimated ejection fraction of of 50%. The cavity size was normal. There was prominent septal-lateral dyssynchrony. 8. The right ventricle has normal systolc function. The cavity was normal.  FINDINGS Left Ventricle: The left ventricle has a visually estimated ejection fraction of of 50%. The cavity size was normal. There is no increase in left ventricular wall thickness.  Right Ventricle: The  right ventricle has normal systolic function. The cavity was normal.  Left Atrium: Left atrial size was severely dilated.   Right Atrium: Right atrial size was mildly dilated. Right atrial pressure is estimated at 10 mmHg.  Interatrial Septum: No atrial level shunt detected by color flow Doppler.  Mitral Valve: The mitral valve has been repaired/replaced.  Tricuspid Valve: Tricuspid valve regurgitation is mild-moderate by color flow Doppler.  Aortic Valve: The aortic valve has been repaired/replaced.  Pulmonic  Valve: The pulmonic valve was grossly normal.   Echo6/1/20 1. The left ventricle has normal systolic function, with an ejection fraction of 55-60%. The cavity size was normal. Left ventricular diastolic function could not be evaluated due to mitral valve replacement/repair. There is abnormal septal motion  consistent with left bundle branch block. 2. Mild hypokinesis of the left ventricular, entire septal wall. 3. The right ventricle has normal systolic function. The cavity was normal. There is no increase in right ventricular wall thickness. Right ventricular systolic pressure is severely elevated with an estimated pressure of 62.4 mmHg. 4. Left atrial size was severely dilated. 5. Right atrial size was severely dilated. 6. A 33 [employer] Jude mechanical valve is present in the mitral position. Procedure Date: 01/24/2015 There is no Echo evidence of rocking of the mitral prosthesis. Echo findings are consistent with perivalvular leak the mitral prosthesis. 7. Mitral valve regurgitation mild perivalvular. Mitral valve gradient appropriate for prosthesis mitral valve stenosis. 8. The tricuspid valve is grossly normal. Tricuspid valve regurgitation is moderate. 9. A 64mm [employer] Jude mechanical prosthesis valve is present in the aortic position. Normal aortic valve prosthesis. Echo findings are consistent with Mean gradient ~30 mmHg of the aortic prosthesis. 10. The aortic  arch is normal in size and structure. 11. There is dilatation of the ascending aorta measuring 38 mm. 12. Post Bentall 1988. 13. The inferior vena cava was dilated in size with <50% respiratory variability.   Patient Profile     58 y.o. 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 [name], of late with worsening CHF picture having a number of hospitalizations this year.  TEE in Aug also noted question of possible LAA thrombus, remains on ASA and warfarin.  He was last seen in HF clinic 06/02/2019, note mentions suspect restrictive CM as etiology of his worsening HF with stable VHD/prosthetics, preserved LVEF and RVEF, and doubt any significant change in his CAD with known occl SVG  Admitted to [facility] 06/21/2019 again with worsening HF symptoms, increasing SOB and edema, acute/chronic CHF exacerbation Yesterday developed hemodynamically unstable VT requiring shocks (x3), started on amiodarone gtt and transferred to ICU  Assessment & Plan    1. VT     Will need ICD prior to discharge     continue amio gtt     He diuresed very well yesterday      Dr. [name] has seen and examined the patient this AM, revisited ICD implant recommendation and rational, as well as procedure. If clinic picture allows and looks like getting ready for discharge perhaps implant tomorrow  I will make him NPO after MN tonight INR is 3.4 today, needs to be < 3.0 for implant   Continue management otherwise to AHF team   For questions or updates, please contact [place] Please consult [email or web address] for contact info under        Signed, Baldwin Jamaica, PA-C  06/23/2019, 8:24 AM    EP Attending  Patient seen and examined. Agree with the findings as noted above. The patient has been stable  overnight from an arrhythmia perspective and has had a nice diuresis. Continue his diuresis and will switch to oral amiodarone tomorrow and plan to proceed with ICD insertion if his INR comes down.   Mikle Bosworth.D.

## 2019-06-23 NOTE — Progress Notes (Signed)
Patient ID: Lance Cook, male   DOB: 1961/07/08, 58 y.o.   MRN: [id number]     Advanced Heart Failure Rounding Note  PCP-Cardiologist: [name], MD   Subjective:    Stable overnight, no VT.  He remains in rate-controlled atrial fibrillation 80s-90s.  SBP stable 90s-100s.  K is normal range finally. BUN/creatinine stable.  Good diuresis yesterday, weight down 5 lbs.  Co-ox 65%.  CVP this morning is better, around 12.  He got a dose of tolvaptan yesterday, Na 123 => 125.   Echo: EF 30-35% with global HK, normal RV size and systolic function, mechanical MV mean gradient 5.7 and mild MR, mechanical AoV mean gradient 12, dilated IVC.   PYP scan: Negative.    Objective:   Weight Range: 83.7 kg Body mass index is 36.04 kg/m.   Vital Signs:   Temp:  [97.6 F (36.4 C)-99.3 F (37.4 C)] 97.7 F (36.5 C) (10/22 0748) Pulse Rate:  [55-220] 85 (10/22 0700) Resp:  [14-24] 17 (10/22 0700) BP: (92-125)/(56-87) 95/63 (10/22 0700) SpO2:  [79 %-100 %] 96 % (10/22 0700) Weight:  [83.7 kg] 83.7 kg (10/22 0600) Last BM Date: 06/21/19  Weight change: Filed Weights   06/21/19 2205 06/22/19 0658 06/23/19 0600  Weight: 86.6 kg 86 kg 83.7 kg    Intake/Output:   Intake/Output Summary (Last 24 hours) at 06/23/2019 0749 Last data filed at 06/23/2019 0700 Gross per 24 hour  Intake 728.53 ml  Output 4600 ml  Net -3871.47 ml      Physical Exam    General:  Well appearing. No resp difficulty HEENT: Normal Neck: Supple. JVP 12 cm. Carotids 2+ bilat; no bruits. No lymphadenopathy or thyromegaly appreciated. Cor: PMI nondisplaced. Irregular rate & rhythm with mechanical S1S2. 2/6 HSM LLSB.  Lungs: Clear Abdomen: Soft, nontender, nondistended. No hepatosplenomegaly. No bruits or masses. Good bowel sounds. Extremities: No cyanosis, clubbing, rash. 1+ edema to knees with unna boots.  Neuro: Alert & orientedx3, cranial nerves grossly intact. moves all 4 extremities w/o difficulty. Affect  pleasant   Telemetry   Atrial fibrillation in 80s, rate PVCs (personally reviewed).   Labs    CBC Recent Labs    06/21/19 1507  WBC 16.8*  HGB 9.5*  HCT 31.2*  MCV 73.4*  PLT 99991111   Basic Metabolic Panel Recent Labs    06/22/19 1954 06/23/19 0405  NA 123* 125*  K 2.9* 3.7  CL 84* 88*  CO2 26 28  GLUCOSE 246* 185*  BUN 65* 60*  CREATININE 1.52* 1.28*  CALCIUM 7.6* 7.7*  MG 2.2 2.2   Liver Function Tests Recent Labs    06/21/19 1752  AST 27  ALT 11  ALKPHOS 64  BILITOT 1.0  PROT 5.2*  ALBUMIN 1.8*   No results for input(s): LIPASE, AMYLASE in the last 72 hours. Cardiac Enzymes No results for input(s): CKTOTAL, CKMB, CKMBINDEX, TROPONINI in the last 72 hours.  BNP: BNP (last 3 results) Recent Labs    04/19/19 1316 04/25/19 1107 06/21/19 1752  BNP 195.6* 194.9* 381.5*    ProBNP (last 3 results) No results for input(s): PROBNP in the last 8760 hours.   D-Dimer No results for input(s): DDIMER in the last 72 hours. Hemoglobin A1C No results for input(s): HGBA1C in the last 72 hours. Fasting Lipid Panel No results for input(s): CHOL, HDL, LDLCALC, TRIG, CHOLHDL, LDLDIRECT in the last 72 hours. Thyroid Function Tests No results for input(s): TSH, T4TOTAL, T3FREE, THYROIDAB in the last 72 hours.  Invalid  input(s): FREET3  Other results:   Imaging    Nm Cardiac Amyloid Tumor Loc Inflam Spect 1 Day  Result Date: 06/22/2019 CLINICAL DATA:  HEART FAILURE. CONCERN FOR CARDIAC AMYLOIDOSIS. EXAM: NUCLEAR MEDICINE TUMOR LOCALIZATION. PYP CARDIAC AMYLOIDOSIS SCAN WITH SPECT TECHNIQUE: Following intravenous administration of radiopharmaceutical, anterior planar images of the chest were obtained. Regions of interest were placed on the heart and contralateral chest wall for quantitative assessment. Additional SPECT imaging of the chest was obtained. RADIOPHARMACEUTICALS:  20.0 mCi TECHNETIUM 99 PYROPHOSPHATE FINDINGS: Planar Visual assessment: Anterior  planar imaging demonstrates radiotracer uptake within the heart less than uptake within the adjacent ribs (Grade 0). Quantitative assessment : Quantitative assessment of the cardiac uptake compared to the contralateral chest wall is equal to (H/CL = 1.1). SPECT assessment: SPECT imaging of the chest demonstrates no radiotracer accumulation within the LEFT ventricle. IMPRESSION: Visual and quantitative assessment (grade 0, H/CLL equal 1.1) are equivocal for transthyretin amyloidosis. Electronically Signed   By: Kerby Moors M.D.   On: 06/22/2019 16:47   Dg Chest Port 1 View  Result Date: 06/22/2019 CLINICAL DATA:  Right PICC placement. EXAM: PORTABLE CHEST 1 VIEW COMPARISON:  06/21/2019 FINDINGS: Right PICC is in place with the tip in the right atrium 7 cm below the carina. I recommend retraction of approximately 3-4 cm. There is chronic cardiomegaly. Marked enlargement of the pulmonary arteries. Prosthetic mitral valve. No infiltrates or effusions. Lungs are clear. No acute bone abnormality. IMPRESSION: 1. Right PICC tip is in the right atrium. I recommend retraction of 3-4 cm. 2. No acute abnormalities. 3. Chronic cardiomegaly. 4. Pulmonary arterial hypertension. 5. Prosthetic mitral valve. Electronically Signed   By: Lorriane Shire M.D.   On: 06/22/2019 13:46   [place] Ekg Site Rite  Result Date: 06/22/2019 If Site Rite image not attached, placement could not be confirmed due to current cardiac rhythm.  [place] Ekg Site Rite  Result Date: 06/22/2019 If Site Rite image not attached, placement could not be confirmed due to current cardiac rhythm.     Medications:     Scheduled Medications:  allopurinol  300 mg Oral Daily   amiodarone  150 mg Intravenous Once   aspirin  81 mg Oral Daily   Chlorhexidine Gluconate Cloth  6 each Topical Daily   furosemide  40 mg Intravenous Once   gabapentin  300 mg Oral TID   Gerhardt's butt cream   Topical TID   insulin aspart  0-15 Units Subcutaneous  TID WC   insulin aspart  0-5 Units Subcutaneous QHS   magnesium oxide  400 mg Oral TID   metoprolol succinate  12.5 mg Oral BID   midodrine  10 mg Oral TID WC   mupirocin ointment  1 application Topical TID   potassium chloride  40 mEq Oral Once   potassium chloride  40 mEq Oral Once   sodium chloride flush  10-40 mL Intracatheter Q12H   sodium chloride flush  3 mL Intravenous Once   spironolactone  25 mg Oral Daily   tolvaptan  15 mg Oral Once   traZODone  100 mg Oral QHS   Warfarin - Pharmacist Dosing Inpatient   Does not apply q1800     Infusions:  amiodarone 30 mg/hr (06/23/19 0700)   furosemide (LASIX) infusion 10 mg/hr (06/23/19 0700)     PRN Medications:  acetaminophen, ALPRAZolam, nitroGLYCERIN, ondansetron (ZOFRAN) IV, oxyCODONE, sodium chloride flush, zolpidem   Assessment/Plan   1. 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  He has permanent atrial fibrillation. Dysfunction of his mechanical valves does not appear to explain his worsening CHF this year. 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 [place] 04/07/19 showed mildly elevated filling pressures.He was doing well until earlier this month, then has developed progressive volume overload and was admitted.  Echo done this admission after VT arrest with DCCV x 3 showed EF 30-35% with global HK, normal RV size and systolic function, mechanical MV mean gradient 5.7 and mild MR, mechanical AoV mean gradient 12, dilated IVC.  It is possible fall in EF is related to stunning from VT arrest. HS-TnI was not significantly elevated.  He diuresed well yesterday with Lasix gtt + tolvaptan, CVP 11-12 this morning with co-ox 65%. Of note, PYP scan showed no evidence for ATTR amyloidosis.    - Valves sounds remain crisp on exam and gradients stable on echo.   - Continue Lasix 10 mg/hr today with aggressive K repletion.  - Will give tolvaptan 15 mg x 1 again today.   -Continue spironolactone at 25 mg daily. - He is on midodrine 10 mg tid currently to maintain BP, will continue for now.  - Unna boots - Patient will need ICD (see below). Would recommend BiV device given low EF and wide QRS/LBBB.  2. Valvular heart disease: Patient has mechanical mitral and aortic valves. TEE in 8/20 showed mechanical MV with mean gradient 5 mmHg, mild peri-valvular regurgitation. The aortic valve appeared to open reasonably well but mean gradient was 23 mmHg, ?component of mild patient-prosthesis mismatch. Echo this admission showed stable gradients (lower for aortic valve).  - Continue ASA 81 and warfarin with INR goal 2.5-3.5.  3. Atrial fibrillation: Chronic. Long-standing atrial fibrillation with severely dilated atria, unlikely to have a successful cardioversion. - Rate control with amiodarone gtt post-arrest.  -Continue Toprol XL at current dose.  4. VT: Patient with VT this admission, rate 200s, monomorphic.  He has not had episodes of palpitations or dizziness at home prior to admission.  He did not lose consciousness or require CPR.  Cardioversion x 3 failed, he converted back to atrial fibrillation after amiodarone boluses and remains in atrial fibrillation currently.  No chest pain and HS-TnI not significantly elevated, think ACS is unlikely. He was hypokalemic.  Cath in 4/16 with no significant disease.   - Continue amiodarone gtt for now.  - Likely needs CRT-D prior to discharge.  5. CAD: Last cath in 4/16 with patent native coronaries, probably occluded SVG-RCA. Doubt CAD as cause of his decompensation. No chest pain with minimally elevated HS-TnI.  6. Type 2 diabetes: SSI.  7. OSA: CPAP. 8. Anemia, iron-deficient: received feraheme 02/22/2019 and again on 04/05/19 but had severe  reaction to feraheme.Placed on allergy list. 9. Hyponatremia: Will give a dose of tolvaptan again today.   Mobilize.   CRITICAL CARE Performed by: Loralie Champagne  Total critical care time: 40 minutes  Critical care time was exclusive of separately billable procedures and treating other patients.  Critical care was necessary to treat or prevent imminent or life-threatening deterioration.  Critical care was time spent personally by me on the following activities: development of treatment plan with patient and/or surrogate as well as nursing, discussions with consultants, evaluation of patient's response to treatment, examination of patient, obtaining history from patient or surrogate, ordering and performing  treatments and interventions, ordering and review of laboratory studies, ordering and review of radiographic studies, pulse oximetry and re-evaluation of patient's condition.    Length of Stay: 2  Loralie Champagne, MD  06/23/2019, 7:49 AM  Advanced Heart Failure Team Pager [phone] (M-F; 7a - 4p)  Please contact [facility] for night-coverage after hours (4p -7a ) and weekends on [email or web address]

## 2019-06-24 ENCOUNTER — Encounter (HOSPITAL_COMMUNITY)
Admission: EM | Disposition: A | Discharge status: Home Health | Payer: Self-pay | Source: Ambulatory Visit | Attending: Internal Medicine

## 2019-06-24 DIAGNOSIS — I5023 Acute on chronic systolic (congestive) heart failure: Secondary | ICD-10-CM | POA: Diagnosis not present

## 2019-06-24 DIAGNOSIS — I472 Ventricular tachycardia: Secondary | ICD-10-CM | POA: Diagnosis not present

## 2019-06-24 LAB — BASIC METABOLIC PANEL
Anion gap: 11 (ref 5–15)
Anion gap: 11 (ref 5–15)
BUN: 53 mg/dL — ABNORMAL HIGH (ref 6–20)
BUN: 49 mg/dL — ABNORMAL HIGH (ref 6–20)
CO2: 29 mmol/L (ref 22–32)
CO2: 30 mmol/L (ref 22–32)
Calcium: 8 mg/dL — ABNORMAL LOW (ref 8.9–10.3)
Calcium: 7.9 mg/dL — ABNORMAL LOW (ref 8.9–10.3)
Chloride: 89 mmol/L — ABNORMAL LOW (ref 98–111)
Chloride: 86 mmol/L — ABNORMAL LOW (ref 98–111)
Creatinine, Ser: 1.16 mg/dL (ref 0.61–1.24)
Creatinine, Ser: 1.22 mg/dL (ref 0.61–1.24)
GFR calc Af Amer: >60 mL/min (ref >60)
GFR calc Af Amer: >60 mL/min (ref >60)
GFR calc non Af Amer: >60 mL/min (ref >60)
GFR calc non Af Amer: >60 mL/min (ref >60)
Glucose, Bld: 204 mg/dL — ABNORMAL HIGH (ref 70–99)
Glucose, Bld: 247 mg/dL — ABNORMAL HIGH (ref 70–99)
Potassium: 3.9 mmol/L (ref 3.5–5.1)
Potassium: 4 mmol/L (ref 3.5–5.1)
Sodium: 129 mmol/L — ABNORMAL LOW (ref 135–145)
Sodium: 127 mmol/L — ABNORMAL LOW (ref 135–145)

## 2019-06-24 LAB — PROTIME-INR
INR: 4.9 (ref 0.8–1.2)
Prothrombin Time: 44.7 seconds — ABNORMAL HIGH (ref 11.4–15.2)

## 2019-06-24 LAB — MULTIPLE MYELOMA PANEL, SERUM
Albumin SerPl Elph-Mcnc: 1.8 g/dL — ABNORMAL LOW (ref 2.9–4.4)
Albumin/Glob SerPl: 0.8 (ref 0.7–1.7)
Alpha 1: 0.5 g/dL — ABNORMAL HIGH (ref 0.0–0.4)
Alpha2 Glob SerPl Elph-Mcnc: 0.6 g/dL (ref 0.4–1.0)
B-Globulin SerPl Elph-Mcnc: 0.7 g/dL (ref 0.7–1.3)
Gamma Glob SerPl Elph-Mcnc: 0.5 g/dL (ref 0.4–1.8)
Globulin, Total: 2.4 g/dL (ref 2.2–3.9)
IgA: 168 mg/dL (ref 90–386)
IgG (Immunoglobin G), Serum: 530 mg/dL — ABNORMAL LOW (ref 603–1613)
IgM (Immunoglobulin M), Srm: 34 mg/dL (ref 20–172)
Total Protein ELP: 4.2 g/dL — ABNORMAL LOW (ref 6.0–8.5)

## 2019-06-24 LAB — GLUCOSE, CAPILLARY
Glucose-Capillary: 92 mg/dL (ref 70–99)
Glucose-Capillary: 102 mg/dL — ABNORMAL HIGH (ref 70–99)
Glucose-Capillary: 98 mg/dL (ref 70–99)
Glucose-Capillary: 157 mg/dL — ABNORMAL HIGH (ref 70–99)

## 2019-06-24 LAB — COOXEMETRY PANEL
Carboxyhemoglobin: 1.7 % — ABNORMAL HIGH (ref 0.5–1.5)
Methemoglobin: 1.4 % (ref 0.0–1.5)
O2 Saturation: 55 %
Total hemoglobin: 10.6 g/dL — ABNORMAL LOW (ref 12.0–16.0)

## 2019-06-24 LAB — MAGNESIUM: Magnesium: 1.9 mg/dL (ref 1.7–2.4)

## 2019-06-24 SURGERY — ICD IMPLANT

## 2019-06-24 MED ORDER — DIGOXIN 125 MCG PO TABS
0.1250 mg | ORAL_TABLET | Freq: Every day | ORAL | Status: DC
  Administered 2019-06-24 – 2019-07-01 (×8): 0.125 mg via ORAL
  Filled 2019-06-24 (×8): qty 1

## 2019-06-24 MED ORDER — POTASSIUM CHLORIDE CRYS ER 20 MEQ PO TBCR
20.0000 meq | EXTENDED_RELEASE_TABLET | Freq: Once | ORAL | Status: AC
  Administered 2019-06-24: 16:00:00 20 meq via ORAL
  Filled 2019-06-24: qty 1

## 2019-06-24 MED ORDER — PHENOL 1.4 % MT LIQD: 1.0000 | OROMUCOSAL | Status: DC | PRN

## 2019-06-24 MED ORDER — POTASSIUM CHLORIDE CRYS ER 20 MEQ PO TBCR
40.0000 meq | EXTENDED_RELEASE_TABLET | Freq: Once | ORAL | Status: AC
  Administered 2019-06-24: 40 meq via ORAL
  Filled 2019-06-24: qty 2

## 2019-06-24 MED ORDER — METOLAZONE 2.5 MG PO TABS
2.5000 mg | ORAL_TABLET | Freq: Once | ORAL | Status: AC
  Administered 2019-06-24: 11:00:00 2.5 mg via ORAL
  Filled 2019-06-24: qty 1

## 2019-06-24 MED ORDER — MAGNESIUM SULFATE 2 GM/50ML IV SOLN
2.0000 g | Freq: Once | INTRAVENOUS | Status: AC
  Administered 2019-06-24: 2 g via INTRAVENOUS
  Filled 2019-06-24: qty 50

## 2019-06-24 NOTE — Progress Notes (Signed)
[place] for warfarin Indication: Mechanical AVR [name] 1988), mech Mitral valve 5/16, Afib (CHADS2VASc = 4)  Allergies  Allergen Reactions  . Feraheme [Ferumoxytol] Shortness Of Breath    SOB/Tachycardia  . Penicillins Hives and Rash    Has patient had a PCN reaction causing immediate rash, facial/tongue/throat swelling, SOB or lightheadedness with hypotension: Yes Has patient had a PCN reaction causing severe rash involving mucus membranes or skin necrosis: Yes Has patient had a PCN reaction that required hospitalization unsure Has patient had a PCN reaction occurring within the last 10 years: 2010-2012 If all of the above answers are "NO", then may proceed with Cephalosporin use.  [name] Other (See Comments)    CDIFF, when taking oral tablets   . Tape Other (See Comments) and Itching    Skin irritation    Patient Measurements: Height: 5' (152.4 cm) Weight: 181 lb (82.1 kg) IBW/kg (Calculated) : 50   Vital Signs: Temp: 97.7 F (36.5 C) (10/23 0749) Temp Source: Oral (10/23 0749) BP: 115/77 (10/23 0600) Pulse Rate: 88 (10/23 0600)  Labs: Recent Labs    06/21/19 1507 06/21/19 1752  06/22/19 1030 06/22/19 1954 06/23/19 0405 06/23/19 1441 06/24/19 0320  HGB 9.5*  --   --   --   --   --   --   --   HCT 31.2*  --   --   --   --   --   --   --   PLT 205  --   --   --   --   --   --   --   LABPROT  --  24.4*   < >  --   --  33.8* 36.7* 44.7*  INR  --  2.2*   < >  --   --  3.4* 3.8* 4.9*  CREATININE 1.73*  --    < >  --  1.52* 1.28* 1.35* 1.16  TROPONINIHS 35* 34*  --  22* 30*  --   --   --    < > = values in this interval not displayed.    Estimated Creatinine Clearance: 62.4 mL/min (by C-G formula based on SCr of 1.16 mg/dL).   Assessment: 58 y.o. 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   Home dose is 5mg   daily, last taken 10/19. INR fluctuating outpatient - was therapeutic on 10/13 at 2.6 but INR 1.2 on 10/6 and 2.6 on 9/29.   INR up 4.9 this morning after increased doses given on 10/20 and 10/21. Warfarin held last night. Started on IV amiodarone 10/21 for VT, will need to watch INR closely.   Pt on the schedule for ICD implant today but procedure rescheduled for Monday since INR is too high.  Goal of Therapy:  INR 2.5 - 3.5 Monitor platelets by anticoagulation protocol: Yes   Plan:  Hold warfarin tonight Ideal INR < 3 for ICD on Monday Daily INR Monitor for bleeding   [name], PharmD PGY2 Cardiology Pharmacy Resident Phone [phone] 06/24/2019       9:14 AM  Please check [email or web address] for unit-specific pharmacist phone numbers

## 2019-06-24 NOTE — Evaluation (Signed)
Physical Therapy Evaluation Patient Details Name: Lance Cook MRN: [id number] DOB: [birth year] Today's Date: 06/24/2019   History of Present Illness  Patient is a 58 y/o male who presents with increased weight gain and edema. Admitted with acute on chronic diastolic CHF, s/p cardioversion 10/21 secondary to unstable VT x3 shocks. Plan for ICD Monday, 10/26.PMH includes CHF, MVR, AVR, A fib, AA dissection, DM, HTN, PVD, testicular CA, CVA at 59 yo.  Clinical Impression  Patient presents with generalized weakness, decreased activity tolerance, impaired endurance, impaired balance and impaired mobility s/p above. Pt lives with wife and gets some assist with LB ADLs at baseline. Uses RW PRN and furniture walks at baseline. Today, pt tolerated transfers and gait training with min guard assist for balance/safety. Pt good at self monitoring symptoms. HR 88-117 bpm, A-fib. Noted to have 2/4 DOE and needed 2 standing rest breaks during activity. Encouraged walking with nursing a few times per day. Will follow acutely to maximize independence and mobility prior to return home.     Follow Up Recommendations No PT follow up;Supervision for mobility/OOB    Equipment Recommendations  None recommended by PT    Recommendations for Other Services       Precautions / Restrictions Precautions Precautions: Fall Restrictions Weight Bearing Restrictions: No      Mobility  Bed Mobility               General bed mobility comments: Up in chair upon PT arrival.  Transfers Overall transfer level: Needs assistance Equipment used: Rolling walker (2 wheeled) Transfers: Sit to/from Stand Sit to Stand: Min guard         General transfer comment: Min guard for safety. Use of momentum to stand.  Ambulation/Gait Ambulation/Gait assistance: Min guard Gait Distance (Feet): 200 Feet Assistive device: Rolling walker (2 wheeled) Gait Pattern/deviations: Step-through pattern;Decreased stride length Gait  velocity: varying speed   General Gait Details: Slow, mostly steady gait wtih RW for support. HR 88-117 bpm A-fib. 2 standing rest breaks due to SOB.  Stairs            Wheelchair Mobility    Modified Rankin (Stroke Patients Only)       Balance Overall balance assessment: Needs assistance Sitting-balance support: Feet supported;No upper extremity supported Sitting balance-Leahy Scale: Fair     Standing balance support: During functional activity Standing balance-Leahy Scale: Fair Standing balance comment: Able to stand statically wtihout UE support, does better with UE support for gait                             Pertinent Vitals/Pain Pain Assessment: No/denies pain    Home Living Family/patient expects to be discharged to:: Private residence Living Arrangements: Spouse/significant other Available Help at Discharge: Family;Available 24 hours/day Type of Home: House Home Access: Stairs to enter Entrance Stairs-Rails: Right Entrance Stairs-Number of Steps: 4 Home Layout: One level Home Equipment: Walker - 2 wheels;Shower seat;Wheelchair - manual;Hand held shower head      Prior Function Level of Independence: Needs assistance   Gait / Transfers Assistance Needed: Uses RW or furniture walks.  ADL's / Homemaking Assistance Needed: wife assists with LB bathing and dressing, dries pt after showering and does much of the IADL  Comments: Wife is legally blind and deaf. "we help each other."     Hand Dominance   Dominant Hand: Right    Extremity/Trunk Assessment   Upper Extremity Assessment Upper Extremity Assessment: Defer to  OT evaluation    Lower Extremity Assessment Lower Extremity Assessment: Generalized weakness       Communication   Communication: No difficulties  Cognition Arousal/Alertness: Awake/alert Behavior During Therapy: WFL for tasks assessed/performed Overall Cognitive Status: Within Functional Limits for tasks assessed                                  General Comments: for basic mobility tasks      General Comments General comments (skin integrity, edema, etc.): BP pre activity 102/64, post activity BP 117/85.    Exercises     Assessment/Plan    PT Assessment Patient needs continued PT services  PT Problem List Decreased strength;Decreased mobility;Decreased activity tolerance;Cardiopulmonary status limiting activity;Decreased balance       PT Treatment Interventions Therapeutic activities;Gait training;Therapeutic exercise;Patient/family education;Balance training;Functional mobility training    PT Goals (Current goals can be found in the Care Plan section)  Acute Rehab PT Goals Patient Stated Goal: to get stronger and go home PT Goal Formulation: With patient Time For Goal Achievement: 07/08/19 Potential to Achieve Goals: Good    Frequency Min 3X/week   Barriers to discharge        Co-evaluation               AM-PAC PT "6 Clicks" Mobility  Outcome Measure Help needed turning from your back to your side while in a flat bed without using bedrails?: None Help needed moving from lying on your back to sitting on the side of a flat bed without using bedrails?: A Little Help needed moving to and from a bed to a chair (including a wheelchair)?: A Little Help needed standing up from a chair using your arms (e.g., wheelchair or bedside chair)?: A Little Help needed to walk in hospital room?: A Little Help needed climbing 3-5 steps with a railing? : A Little 6 Click Score: 19    End of Session Equipment Utilized During Treatment: Gait belt Activity Tolerance: Patient tolerated treatment well Patient left: in chair;with call bell/phone within reach Nurse Communication: Mobility status PT Visit Diagnosis: Difficulty in walking, not elsewhere classified (R26.2);Unsteadiness on feet (R26.81);Muscle weakness (generalized) (M62.81)    Time: [id number] PT Time Calculation (min)  (ACUTE ONLY): 27 min   Charges:   PT Evaluation $PT Eval Moderate Complexity: 1 Mod PT Treatments $Gait Training: 8-22 mins        Wray Kearns, PT, DPT Acute Rehabilitation Services Pager [phone] Office [place] 06/24/2019, 12:28 PM

## 2019-06-24 NOTE — Progress Notes (Signed)
Patient ID: Lance Cook, male   DOB: [birth year], 58 y.o.   MRN: [id number]     Advanced Heart Failure Rounding Note  PCP-Cardiologist: [name], MD   Subjective:    Stable overnight, no VT.  He remains in rate-controlled atrial fibrillation 80s-90s.  SBP stable 100s-110s.  K is normal range. BUN/creatinine lower today.  Good diuresis again yesterday, weight down.  Co-ox 55%.  CVP this morning 15-16.  He got another dose of tolvaptan yesterday, Na 123 => 125 => 129.   Echo: EF 30-35% with global HK, normal RV size and systolic function, mechanical MV mean gradient 5.7 and mild MR, mechanical AoV mean gradient 12, dilated IVC.   PYP scan: Negative.    Objective:   Weight Range: 82.1 kg Body mass index is 35.35 kg/m.   Vital Signs:   Temp:  [97.5 F (36.4 C)-98.2 F (36.8 C)] 97.7 F (36.5 C) (10/23 0749) Pulse Rate:  [78-100] 88 (10/23 0600) Resp:  [13-24] 15 (10/23 0600) BP: (80-122)/(57-91) 115/77 (10/23 0600) SpO2:  [91 %-100 %] 96 % (10/23 0600) Weight:  [82.1 kg] 82.1 kg (10/23 0500) Last BM Date: 06/21/19  Weight change: Filed Weights   06/22/19 0658 06/23/19 0600 06/24/19 0500  Weight: 86 kg 83.7 kg 82.1 kg    Intake/Output:   Intake/Output Summary (Last 24 hours) at 06/24/2019 0907 Last data filed at 06/24/2019 0600 Gross per 24 hour  Intake 1906.03 ml  Output 5600 ml  Net -3693.97 ml      Physical Exam    General:  Well appearing. No resp difficulty HEENT: Normal Neck: Supple. JVP 12 cm. Carotids 2+ bilat; no bruits. No lymphadenopathy or thyromegaly appreciated. Cor: PMI nondisplaced. Irregular rate & rhythm with mechanical S1S2. 2/6 HSM LLSB.  Lungs: Clear Abdomen: Soft, nontender, nondistended. No hepatosplenomegaly. No bruits or masses. Good bowel sounds. Extremities: No cyanosis, clubbing, rash. 1+ edema to knees with unna boots.  Neuro: Alert & orientedx3, cranial nerves grossly intact. moves all 4 extremities w/o difficulty. Affect pleasant    Telemetry   Atrial fibrillation in 80s, rate PVCs (personally reviewed).   Labs    CBC Recent Labs    06/21/19 1507  WBC 16.8*  HGB 9.5*  HCT 31.2*  MCV 73.4*  PLT 99991111   Basic Metabolic Panel Recent Labs    06/23/19 0405  06/23/19 1441 06/24/19 0320  NA 125*   < > 127* 129*  K 3.7  --  3.3* 3.9  CL 88*  --  84* 89*  CO2 28  --  30 29  GLUCOSE 185*  --  248* 204*  BUN 60*  --  55* 53*  CREATININE 1.28*  --  1.35* 1.16  CALCIUM 7.7*  --  7.6* 8.0*  MG 2.2  --   --  1.9   < > = values in this interval not displayed.   Liver Function Tests Recent Labs    06/21/19 1752  AST 27  ALT 11  ALKPHOS 64  BILITOT 1.0  PROT 5.2*  ALBUMIN 1.8*   No results for input(s): LIPASE, AMYLASE in the last 72 hours. Cardiac Enzymes No results for input(s): CKTOTAL, CKMB, CKMBINDEX, TROPONINI in the last 72 hours.  BNP: BNP (last 3 results) Recent Labs    04/19/19 1316 04/25/19 1107 06/21/19 1752  BNP 195.6* 194.9* 381.5*    ProBNP (last 3 results) No results for input(s): PROBNP in the last 8760 hours.   D-Dimer No results for input(s): DDIMER in the  last 72 hours. Hemoglobin A1C No results for input(s): HGBA1C in the last 72 hours. Fasting Lipid Panel No results for input(s): CHOL, HDL, LDLCALC, TRIG, CHOLHDL, LDLDIRECT in the last 72 hours. Thyroid Function Tests No results for input(s): TSH, T4TOTAL, T3FREE, THYROIDAB in the last 72 hours.  Invalid input(s): FREET3  Other results:   Imaging    No results found.   Medications:     Scheduled Medications: . allopurinol  300 mg Oral Daily  . amiodarone  150 mg Intravenous Once  . aspirin  81 mg Oral Daily  . Chlorhexidine Gluconate Cloth  6 each Topical Daily  . furosemide  40 mg Intravenous Once  . gabapentin  300 mg Oral TID  . gentamicin irrigation  80 mg Irrigation On Call  . Gerhardt's butt cream   Topical TID  . insulin aspart  0-15 Units Subcutaneous TID WC  . insulin aspart  0-5 Units  Subcutaneous QHS  . magnesium oxide  400 mg Oral TID  . metoprolol succinate  12.5 mg Oral BID  . midodrine  10 mg Oral TID WC  . mupirocin ointment  1 application Topical TID  . sodium chloride flush  10-40 mL Intracatheter Q12H  . sodium chloride flush  3 mL Intravenous Once  . spironolactone  25 mg Oral Daily  . traZODone  100 mg Oral QHS  . Warfarin - Pharmacist Dosing Inpatient   Does not apply q1800    Infusions: . amiodarone 30 mg/hr (06/24/19 0600)  . furosemide (LASIX) infusion 10 mg/hr (06/24/19 0600)  . vancomycin      PRN Medications: acetaminophen, ALPRAZolam, nitroGLYCERIN, ondansetron (ZOFRAN) IV, oxyCODONE, sodium chloride flush, zolpidem   Assessment/Plan   1. 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  He has permanent atrial fibrillation. Dysfunction of his mechanical valves does not appear to explain his worsening CHF this year. 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 [place] 04/07/19 showed mildly elevated filling pressures.He was doing well until earlier this month, then has developed progressive volume overload and was admitted.  Echo done this admission after VT arrest with DCCV x 3 showed EF 30-35% with global HK, normal RV size and systolic function, mechanical MV mean gradient 5.7 and mild MR, mechanical AoV mean gradient 12, dilated IVC.  It is possible fall in EF is related to stunning from VT arrest. HS-TnI was not significantly elevated.  He diuresed well yesterday with Lasix gtt + tolvaptan, CVP 15-16 this morning with co-ox 55%. Of note, PYP scan showed no evidence for ATTR amyloidosis.  - Valves sounds remain crisp on exam and gradients stable on echo.   - Continue Lasix 10 mg/hr today and now that sodium is better, will  give metolazone 2.5 x 1.  Will repeat BMET in afternoon with aggressive K repletion.  -Continue spironolactone at 25 mg daily. - He is on midodrine 10 mg tid currently to maintain BP, will continue for now.  - Add digoxin 0.125 daily.  - Unna boots - Patient will need ICD (see below). Would recommend BiV device given low EF and wide QRS/LBBB, will also discuss AV nodal ablation with Dr. [name].  2. Valvular heart disease: Patient has mechanical mitral and aortic valves. TEE in 8/20 showed mechanical MV with mean gradient 5 mmHg, mild peri-valvular regurgitation. The aortic valve  appeared to open reasonably well but mean gradient was 23 mmHg, ?component of mild patient-prosthesis mismatch. Echo this admission showed stable gradients (lower for aortic valve).  - Continue ASA 81 and warfarin with INR goal 2.5-3.5 => INR 4.9, warfarin on hold for now.  Can be in the 2s range for CRT-D implantation.  Cover with heparin gtt if INR drops low.  3. Atrial fibrillation: Chronic. Long-standing atrial fibrillation with severely dilated atria, unlikely to have a successful cardioversion. - Rate control with amiodarone gtt post-arrest.  -Continue Toprol XL at current dose.  - Need to consider AV nodal ablation with CRT-D implantation on Monday.  4. VT: Patient with VT this admission, rate 200s, monomorphic.  He has not had episodes of palpitations or dizziness at home prior to admission.  He did not lose consciousness or require CPR.  Cardioversion x 3 failed, he converted back to atrial fibrillation after amiodarone boluses and remains in atrial fibrillation currently.  No chest pain and HS-TnI not significantly elevated, think ACS is unlikely. He was hypokalemic.  Cath in 4/16 with no significant disease.   - Continue amiodarone gtt for now.  - Likely needs CRT-D prior to discharge => plan for Monday, need INR in 2s range.  5. CAD: Last cath in 4/16 with patent native coronaries, probably occluded SVG-RCA.  Doubt CAD as cause of his decompensation. No chest pain with minimally elevated HS-TnI.  6. Type 2 diabetes: SSI.  7. OSA: CPAP. 8. Anemia, iron-deficient: received feraheme 02/22/2019 and again on 04/05/19 but had severe reaction to feraheme.Placed on allergy list. 9. Hyponatremia: Sodium improved to 129.  Fluid restrict and follow closely.  10. Mobilize, PT.    Mobilize.   CRITICAL CARE Performed by: Loralie Champagne  Total critical care time: 40 minutes  Critical care time was exclusive of separately billable procedures and treating other patients.  Critical care was necessary to treat or prevent imminent or life-threatening deterioration.  Critical care was time spent personally by me on the following activities: development of treatment plan with patient and/or surrogate as well as nursing, discussions with consultants, evaluation of patient's response to treatment, examination of patient, obtaining history from patient or surrogate, ordering and performing treatments and interventions, ordering and review of laboratory studies, ordering and review of radiographic studies, pulse oximetry and re-evaluation of patient's condition.    Length of Stay: 3  Loralie Champagne, MD  06/24/2019, 9:07 AM  Advanced Heart Failure Team Pager [phone] (M-F; 7a - 4p)  Please contact [facility] for night-coverage after hours (4p -7a ) and weekends on [email or web address]

## 2019-06-24 NOTE — Progress Notes (Signed)
Orthopedic Tech Progress Note Patient Details:  Lance Cook [birth year] [facility]:[id number]  Patient ID: [name], male   DOB: 1960-11-11, 58 y.o.   MRN: [id number]   [name] 06/24/2019, 10:33 AMOrtho Department out of [facility].

## 2019-06-24 NOTE — Progress Notes (Addendum)
Progress Note  Patient Name: Lance Cook Date of Encounter: 06/24/2019  Primary Cardiologist: [name], MD   Subjective   No CP, feels better today  Inpatient Medications    Scheduled Meds: . allopurinol  300 mg Oral Daily  . amiodarone  150 mg Intravenous Once  . aspirin  81 mg Oral Daily  . Chlorhexidine Gluconate Cloth  6 each Topical Daily  . furosemide  40 mg Intravenous Once  . gabapentin  300 mg Oral TID  . gentamicin irrigation  80 mg Irrigation On Call  . Gerhardt's butt cream   Topical TID  . insulin aspart  0-15 Units Subcutaneous TID WC  . insulin aspart  0-5 Units Subcutaneous QHS  . magnesium oxide  400 mg Oral TID  . metoprolol succinate  12.5 mg Oral BID  . midodrine  10 mg Oral TID WC  . mupirocin ointment  1 application Topical TID  . sodium chloride flush  10-40 mL Intracatheter Q12H  . sodium chloride flush  3 mL Intravenous Once  . spironolactone  25 mg Oral Daily  . traZODone  100 mg Oral QHS  . Warfarin - Pharmacist Dosing Inpatient   Does not apply q1800   Continuous Infusions: . amiodarone 30 mg/hr (06/24/19 0600)  . furosemide (LASIX) infusion 10 mg/hr (06/24/19 0600)  . vancomycin     PRN Meds: acetaminophen, ALPRAZolam, nitroGLYCERIN, ondansetron (ZOFRAN) IV, oxyCODONE, sodium chloride flush, zolpidem   Vital Signs    Vitals:   06/24/19 0400 06/24/19 0500 06/24/19 0600 06/24/19 0749  BP: 98/69 116/77 115/77   Pulse: 81 87 88   Resp: 18 18 15    Temp:    97.7 F (36.5 C)  TempSrc:    Oral  SpO2: 97% 99% 96%   Weight:  82.1 kg    Height:        Intake/Output Summary (Last 24 hours) at 06/24/2019 0818 Last data filed at 06/24/2019 0600 Gross per 24 hour  Intake 2172.71 ml  Output 5600 ml  Net -3427.29 ml   Last 3 Weights 06/24/2019 06/23/2019 06/22/2019  Weight (lbs) 181 lb 184 lb 8.4 oz 189 lb 9.5 oz  Weight (kg) 82.1 kg 83.7 kg 86 kg      Telemetry    AFib 80's, no VT - Personally Reviewed  ECG    No new EKGs  - Personally Reviewed  Physical Exam   OOB to chair eating breakfast GEN: No acute distress, chronically ill appearing   Neck: No JVD Cardiac: irreg-irreg, valves appreciated Respiratory: decreased at the bases. GI: Soft, nontender, non-distended  MS: b/l LE w/unna boots. + edema Neuro:  Nonfocal  Psych: Normal affect   Labs    High Sensitivity Troponin:   Recent Labs  Lab 06/21/19 1507 06/21/19 1752 06/22/19 1030 06/22/19 1954  TROPONINIHS 35* 34* 22* 30*      Chemistry Recent Labs  Lab 06/21/19 1752  06/23/19 0405 06/23/19 1139 06/23/19 1441 06/24/19 0320  NA  --    < > 125* 128* 127* 129*  K  --    < > 3.7  --  3.3* 3.9  CL  --    < > 88*  --  84* 89*  CO2  --    < > 28  --  30 29  GLUCOSE  --    < > 185*  --  248* 204*  BUN  --    < > 60*  --  55* 53*  CREATININE  --    < >  1.28*  --  1.35* 1.16  CALCIUM  --    < > 7.7*  --  7.6* 8.0*  PROT 5.2*  --   --   --   --   --   ALBUMIN 1.8*  --   --   --   --   --   AST 27  --   --   --   --   --   ALT 11  --   --   --   --   --   ALKPHOS 64  --   --   --   --   --   BILITOT 1.0  --   --   --   --   --   GFRNONAA  --    < > >60  --  58* >60  GFRAA  --    < > >60  --  >60 >60  ANIONGAP  --    < > 9  --  13 11   < > = values in this interval not displayed.     Hematology Recent Labs  Lab 06/21/19 1507  WBC 16.8*  RBC 4.25  HGB 9.5*  HCT 31.2*  MCV 73.4*  MCH 22.4*  MCHC 30.4  RDW 19.5*  PLT 205    BNP Recent Labs  Lab 06/21/19 1752  BNP 381.5*     DDimer No results for input(s): DDIMER in the last 168 hours.   Radiology    nm Cardiac Amyloid Tumor Loc Inflam Spect 1 Day Result Date: 06/22/2019 CLINICAL DATA:  HEART FAILURE. CONCERN FOR CARDIAC AMYLOIDOSIS. EXAM: NUCLEAR MEDICINE TUMOR LOCALIZATION. PYP CARDIAC AMYLOIDOSIS SCAN WITH SPECT TECHNIQUE: Following intravenous administration of radiopharmaceutical, anterior planar images of the chest were obtained. Regions of interest were placed  on the heart and contralateral chest wall for quantitative assessment. Additional SPECT imaging of the chest was obtained. RADIOPHARMACEUTICALS:  20.0 mCi TECHNETIUM 99 PYROPHOSPHATE FINDINGS: Planar Visual assessment: Anterior planar imaging demonstrates radiotracer uptake within the heart less than uptake within the adjacent ribs (Grade 0). Quantitative assessment : Quantitative assessment of the cardiac uptake compared to the contralateral chest wall is equal to (H/CL = 1.1). SPECT assessment: SPECT imaging of the chest demonstrates no radiotracer accumulation within the LEFT ventricle. IMPRESSION: Visual and quantitative assessment (grade 0, H/CLL equal 1.1) are equivocal for transthyretin amyloidosis. Electronically Signed   By: Kerby Moors M.D.   On: 06/22/2019 16:47      Cardiac Studies   06/22/2019: TTE IMPRESSIONS 1. Left ventricular ejection fraction, by visual estimation, is 30 to 35%. The left ventricle has severely decreased function. Severely increased left ventricular size. There is no left ventricular hypertrophy.  2. Definity contrast agent was given IV to delineate the left ventricular endocardial borders.  3. Global hypokinesis, though function more preserved at base than apex. LV contrast excludes apical thrombus.  4. Global right ventricle has normal systolic function.The right ventricular size is normal. No increase in right ventricular wall thickness.  5. Left atrial size was normal.  6. Right atrial size was normal.  7. The mitral valve has been repaired/replaced. Mild mitral valve regurgitation. Moderate mitral stenosis.  8. The tricuspid valve is normal in structure. Tricuspid valve regurgitation mild-moderate.  9. 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 10. The aortic valve is normal in structure. Aortic valve regurgitation is trivial by color flow Doppler. Structurally normal aortic valve, with no evidence of sclerosis or stenosis. 11. Aortic valve with  trivial perivalvular leak. 12. The pulmonic valve was grossly normal. Pulmonic valve regurgitation is not visualized by color flow Doppler. 13. S/P Bentall. 14. Moderately elevated pulmonary artery systolic pressure. 15. A pacer wire is visualized in the RV and RA. 16. The inferior vena cava is dilated in size with <50% respiratory variability, suggesting right atrial pressure of 15 mmHg. 17. History of mechanical aortic and mitral valves. Leaflets not well visualized, could consider fluroscopy of leaflets, but gradients not suggestive of obstruction.  RHCAug 2020 Hemodynamics (mmHg) RA mean 11 RV 46/6 PA 50/21, mean 33 PCWP mean 19  Oxygen saturations: PA 69% AO 100%  Cardiac Output (Fick) 6.24  Cardiac Index (Fick) 3.32 PVR 2.2 WU PAPI 2.63 CVP/PCWP 0.59   04/04/2019: TEE IMPRESSIONS 1. Left atrial size was severely dilated. Smoke in the left atrium. Cannot rule out a small LA appendage thrombus (at tip of LAA). 2. Right atrial size was mildly dilated. 3. No PFO/ASD, negative bubble study. 4. Tricuspid valve regurgitation is mild-moderate. Peak RV-RA gradient 45 mmHg. 5. Mechanical mitral valve. There was mild peri-valvular regurgitation. Mean gradient across valve of 5 mmHg did not suggest significant stenosis. 6. Mechanical aortic valve. No significant regurgitation. Mean gradient 23 mmHg. The mean gradient is higher than expected but does not suggest severe stenosis. Visually, the valve appears to open well. 7. The left ventricle had a visually estimated ejection fraction of of 50%. The cavity size was normal. There was prominent septal-lateral dyssynchrony. 8. The right ventricle has normal systolc function. The cavity was normal.  FINDINGS Left Ventricle: The left ventricle has a visually estimated ejection fraction of of 50%. The cavity size was normal. There is no increase in left ventricular wall thickness.  Right Ventricle: The right ventricle has  normal systolic function. The cavity was normal.  Left Atrium: Left atrial size was severely dilated.   Right Atrium: Right atrial size was mildly dilated. Right atrial pressure is estimated at 10 mmHg.  Interatrial Septum: No atrial level shunt detected by color flow Doppler.  Mitral Valve: The mitral valve has been repaired/replaced.  Tricuspid Valve: Tricuspid valve regurgitation is mild-moderate by color flow Doppler.  Aortic Valve: The aortic valve has been repaired/replaced.  Pulmonic Valve: The pulmonic valve was grossly normal.   Echo6/1/20 1. The left ventricle has normal systolic function, with an ejection fraction of 55-60%. The cavity size was normal. Left ventricular diastolic function could not be evaluated due to mitral valve replacement/repair. There is abnormal septal motion  consistent with left bundle branch block. 2. Mild hypokinesis of the left ventricular, entire septal wall. 3. The right ventricle has normal systolic function. The cavity was normal. There is no increase in right ventricular wall thickness. Right ventricular systolic pressure is severely elevated with an estimated pressure of 62.4 mmHg. 4. Left atrial size was severely dilated. 5. Right atrial size was severely dilated. 6. A 33 [employer]. Jude mechanical valve is present in the mitral position. Procedure Date: 01/24/2015 There is no Echo evidence of rocking of the mitral prosthesis. Echo findings are consistent with perivalvular leak the mitral prosthesis. 7. Mitral valve regurgitation mild perivalvular. Mitral valve gradient appropriate for prosthesis mitral valve stenosis. 8. The tricuspid valve is grossly normal. Tricuspid valve regurgitation is moderate. 9. A 71mm [employer] Jude mechanical prosthesis valve is present in the aortic position. Normal aortic valve prosthesis. Echo findings are consistent with Mean gradient ~30 mmHg of the aortic prosthesis. 10. The aortic arch is normal in  size and structure.  11. There is dilatation of the ascending aorta measuring 38 mm. 12. Post Bentall 1988. 13. The inferior vena cava was dilated in size with <50% respiratory variability.   Patient Profile     58 y.o. 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 [name], of late with worsening CHF picture having a number of hospitalizations this year.  TEE in Aug also noted question of possible LAA thrombus, remains on ASA and warfarin.  He was last seen in HF clinic 06/02/2019, note mentions suspect restrictive CM as etiology of his worsening HF with stable VHD/prosthetics, preserved LVEF and RVEF, and doubt any significant change in his CAD with known occl SVG  Admitted to [facility]/21/2019 again with worsening HF symptoms, increasing SOB and edema, acute/chronic CHF exacerbation Yesterday developed hemodynamically unstable VT requiring shocks (x3), started on amiodarone gtt and transferred to ICU  Assessment & Plan    1. VT     Will need ICD prior to discharge     continue amio gtt     He continues to diurese very well   Was planned for ICD today, unfortunately despite holding his warfarin and having greens yesterday his INR up significantly to 4.9 (likely 2/2 the amiodarone) Will reschedule him for next week  LVEF has diminished andhe has developed LBBB Plan for CRT-D Monday with Dr. [name]  Continue primary management with AHF team  Continue management otherwise to AHF team   For questions or updates, please contact [facility] Please consult [email or web address] for contact info under     Signed, Baldwin Jamaica, PA-C  06/24/2019, 8:18 AM    EP Attending  Patient seen and examined. Agree with the findings as noted above. The patient has an elevated INR and his ICD  will be deferred. Hopefully his INR will come down without the need for Vit K. His amiodarone will be changed to oral.   Mikle Bosworth.D.

## 2019-06-25 DIAGNOSIS — I5043 Acute on chronic combined systolic (congestive) and diastolic (congestive) heart failure: Secondary | ICD-10-CM | POA: Diagnosis not present

## 2019-06-25 DIAGNOSIS — I482 Chronic atrial fibrillation, unspecified: Secondary | ICD-10-CM | POA: Diagnosis not present

## 2019-06-25 DIAGNOSIS — I472 Ventricular tachycardia: Secondary | ICD-10-CM | POA: Diagnosis not present

## 2019-06-25 LAB — CBC
HCT: 33.6 % — ABNORMAL LOW (ref 39.0–52.0)
Hemoglobin: 10.1 g/dL — ABNORMAL LOW (ref 13.0–17.0)
MCH: 22.5 pg — ABNORMAL LOW (ref 26.0–34.0)
MCHC: 30.1 g/dL (ref 30.0–36.0)
MCV: 74.8 fL — ABNORMAL LOW (ref 80.0–100.0)
Platelets: 205 10*3/uL (ref 150–400)
RBC: 4.49 MIL/uL (ref 4.22–5.81)
RDW: 19.9 % — ABNORMAL HIGH (ref 11.5–15.5)
WBC: 21.4 10*3/uL — ABNORMAL HIGH (ref 4.0–10.5)
nRBC: 0.1 % (ref 0.0–0.2)

## 2019-06-25 LAB — BASIC METABOLIC PANEL
Anion gap: 11 (ref 5–15)
BUN: 47 mg/dL — ABNORMAL HIGH (ref 6–20)
CO2: 32 mmol/L (ref 22–32)
Calcium: 8.1 mg/dL — ABNORMAL LOW (ref 8.9–10.3)
Chloride: 86 mmol/L — ABNORMAL LOW (ref 98–111)
Creatinine, Ser: 1.22 mg/dL (ref 0.61–1.24)
GFR calc Af Amer: >60 mL/min (ref >60)
GFR calc non Af Amer: >60 mL/min (ref >60)
Glucose, Bld: 99 mg/dL (ref 70–99)
Potassium: 3.2 mmol/L — ABNORMAL LOW (ref 3.5–5.1)
Sodium: 129 mmol/L — ABNORMAL LOW (ref 135–145)

## 2019-06-25 LAB — PROTIME-INR
INR: 3.2 — ABNORMAL HIGH (ref 0.8–1.2)
Prothrombin Time: 32.5 seconds — ABNORMAL HIGH (ref 11.4–15.2)

## 2019-06-25 LAB — GLUCOSE, CAPILLARY
Glucose-Capillary: 101 mg/dL — ABNORMAL HIGH (ref 70–99)
Glucose-Capillary: 89 mg/dL (ref 70–99)
Glucose-Capillary: 99 mg/dL (ref 70–99)
Glucose-Capillary: 97 mg/dL (ref 70–99)

## 2019-06-25 LAB — URINALYSIS, ROUTINE W REFLEX MICROSCOPIC
Bilirubin Urine: NEGATIVE
Glucose, UA: NEGATIVE mg/dL
Hgb urine dipstick: NEGATIVE
Ketones, ur: NEGATIVE mg/dL
Leukocytes,Ua: NEGATIVE
Nitrite: NEGATIVE
Protein, ur: NEGATIVE mg/dL
Specific Gravity, Urine: 1.008 (ref 1.005–1.030)
pH: 6 (ref 5.0–8.0)

## 2019-06-25 LAB — COOXEMETRY PANEL
Carboxyhemoglobin: 0.5 % (ref 0.5–1.5)
Methemoglobin: 1 % (ref 0.0–1.5)
O2 Saturation: 52.6 %
Total hemoglobin: 11 g/dL — ABNORMAL LOW (ref 12.0–16.0)

## 2019-06-25 LAB — MAGNESIUM: Magnesium: 2 mg/dL (ref 1.7–2.4)

## 2019-06-25 MED ORDER — POTASSIUM CHLORIDE CRYS ER 20 MEQ PO TBCR: 40.0000 meq | EXTENDED_RELEASE_TABLET | Freq: Every day | ORAL | Status: DC

## 2019-06-25 MED ORDER — WARFARIN SODIUM 2.5 MG PO TABS
2.5000 mg | ORAL_TABLET | Freq: Once | ORAL | Status: AC
  Administered 2019-06-25: 2.5 mg via ORAL
  Filled 2019-06-25: qty 1

## 2019-06-25 MED ORDER — POTASSIUM CHLORIDE CRYS ER 20 MEQ PO TBCR
40.0000 meq | EXTENDED_RELEASE_TABLET | Freq: Two times a day (BID) | ORAL | Status: DC
  Administered 2019-06-26 – 2019-06-28 (×6): 40 meq via ORAL
  Filled 2019-06-25 (×6): qty 2

## 2019-06-25 MED ORDER — POTASSIUM CHLORIDE CRYS ER 20 MEQ PO TBCR
40.0000 meq | EXTENDED_RELEASE_TABLET | Freq: Once | ORAL | Status: AC
  Administered 2019-06-25: 40 meq via ORAL
  Filled 2019-06-25: qty 2

## 2019-06-25 MED ORDER — POTASSIUM CHLORIDE CRYS ER 20 MEQ PO TBCR: 40.0000 meq | EXTENDED_RELEASE_TABLET | Freq: Once | ORAL | Status: DC

## 2019-06-25 NOTE — Progress Notes (Signed)
Pt placed on CPAP at 8cm H2O. And a full face mask. Pt tol well at this time. Will monitor

## 2019-06-25 NOTE — Progress Notes (Signed)
Progress Note  Patient Name: Lance Cook Date of Encounter: 06/25/2019  Primary Cardiologist: [name], MD   Patient Profile     58 y.o. 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 [name], of late with worsening CHF picture having a number of hospitalizations this year.  TEE in Aug also noted question of possible LAA thrombus, remains on ASA and warfarin.  He was last seen in HF clinic 06/02/2019, note mentions suspect restrictive CM as etiology of his worsening HF with stable VHD/prosthetics, preserved LVEF and RVEF, and doubt any significant change in his CAD with known occl SVG  Admitted to [facility] 06/21/2019 again with worsening HF symptoms, increasing SOB and edema, acute/chronic CHF exacerbation  Developed hemodynamically unstable VT requiring shocks (x3), started on amiodarone gtt and transferred to ICU  Subjective   Felt better ydday than today but breathing is still pretty good   Inpatient Medications    Scheduled Meds:  allopurinol  300 mg Oral Daily   amiodarone  150 mg Intravenous Once   aspirin  81 mg Oral Daily   Chlorhexidine Gluconate Cloth  6 each Topical Daily   digoxin  0.125 mg Oral Daily   furosemide  40 mg Intravenous Once   gabapentin  300 mg Oral TID   Gerhardt's butt cream   Topical TID   insulin aspart  0-15 Units Subcutaneous TID WC   insulin aspart  0-5 Units Subcutaneous QHS   magnesium oxide  400 mg Oral TID   metoprolol succinate  12.5 mg Oral BID   midodrine  10 mg Oral TID WC   mupirocin ointment  1 application Topical TID   sodium chloride flush  10-40 mL Intracatheter Q12H   sodium chloride flush  3 mL Intravenous Once   spironolactone  25 mg Oral Daily   traZODone  100 mg Oral QHS   Warfarin - Pharmacist  Dosing Inpatient   Does not apply q1800   Continuous Infusions:  amiodarone 30 mg/hr (06/25/19 0042)   furosemide (LASIX) infusion 10 mg/hr (06/24/19 1800)   PRN Meds: acetaminophen, ALPRAZolam, nitroGLYCERIN, ondansetron (ZOFRAN) IV, oxyCODONE, phenol, sodium chloride flush, zolpidem   Vital Signs    Vitals:   06/24/19 1950 06/24/19 2327 06/25/19 0252 06/25/19 0749  BP: 109/71 100/64 105/65 (!) 98/52  Pulse: 94 91 97 98  Resp: (!) 25 19 20  (!) 21  Temp: 98.4 F (36.9 C) 98.1 F (36.7 C) 98.1 F (36.7 C) 98.6 F (37 C)  TempSrc: Oral Oral Oral Oral  SpO2: 90% 97% 94% 92%  Weight:   86.3 kg   Height:        Intake/Output Summary (Last 24 hours) at 06/25/2019 0825 Last data filed at 06/25/2019 0809 Gross per 24 hour  Intake 1755.66 ml  Output 4450 ml  Net -2694.34 ml   Last 3 Weights 06/25/2019 06/24/2019 06/23/2019  Weight (lbs) 190 lb 4.1 oz 181 lb 184 lb 8.4 oz  Weight (kg) 86.3 kg 82.1 kg 83.7 kg      Telemetry  No VT  Personally Reviewed  ECG    Multiple reviewed 10/21 0221 >>afib with LBBB QGS 714 with fragmentation  Personally Reviewed  Physical Exam   Well developed and nourished in no acute distress HENT normal Neck supple  Irregular rate and rhythm,   Legs wrapped Skin-warm and dry A & Oriented  Grossly normal sensory and motor function  ECG    Labs    High Sensitivity Troponin:   Recent Labs  Lab 06/21/19 1507 06/21/19 1752 06/22/19 1030 06/22/19 1954  TROPONINIHS 35* 34* 22* 30*      Chemistry Recent Labs  Lab 06/21/19 1752  06/24/19 0320 06/24/19 1307 06/25/19 0422  NA  --    < > 129* 127* 129*  K  --    < > 3.9 4.0 3.2*  CL  --    < > 89* 86* 86*  CO2  --    < > 29 30 32  GLUCOSE  --    < > 204* 247* 99  BUN  --    < > 53* 49* 47*  CREATININE  --    < > 1.16 1.22 1.22  CALCIUM  --    < > 8.0* 7.9* 8.1*  PROT 5.2*  --   --   --   --   ALBUMIN 1.8*  --   --   --   --   AST 27  --   --   --   --   ALT 11  --   --    --   --   ALKPHOS 64  --   --   --   --   BILITOT 1.0  --   --   --   --   GFRNONAA  --    < > >60 >60 >60  GFRAA  --    < > >60 >60 >60  ANIONGAP  --    < > 11 11 11    < > = values in this interval not displayed.     Hematology Recent Labs  Lab 06/21/19 1507 06/25/19 0422  WBC 16.8* 21.4*  RBC 4.25 4.49  HGB 9.5* 10.1*  HCT 31.2* 33.6*  MCV 73.4* 74.8*  MCH 22.4* 22.5*  MCHC 30.4 30.1  RDW 19.5* 19.9*  PLT 205 205    BNP Recent Labs  Lab 06/21/19 1752  BNP 381.5*     DDimer No results for input(s): DDIMER in the last 168 hours.   Radiology    nm Cardiac Amyloid Tumor Loc Inflam Spect 1 Day Result Date: 06/22/2019 CLINICAL DATA:  HEART FAILURE. CONCERN FOR CARDIAC AMYLOIDOSIS. EXAM: NUCLEAR MEDICINE TUMOR LOCALIZATION. PYP CARDIAC AMYLOIDOSIS SCAN WITH SPECT TECHNIQUE: Following intravenous administration of radiopharmaceutical, anterior planar images of the chest were obtained. Regions of interest were placed on the heart and contralateral chest wall for quantitative assessment. Additional SPECT imaging of the chest was obtained. RADIOPHARMACEUTICALS:  20.0 mCi TECHNETIUM 99 PYROPHOSPHATE FINDINGS: Planar Visual assessment: Anterior planar imaging demonstrates radiotracer uptake within the heart less than uptake within the adjacent ribs (Grade 0). Quantitative assessment : Quantitative assessment of the cardiac uptake compared to the contralateral chest wall is equal to (H/CL = 1.1). SPECT assessment: SPECT imaging of the chest demonstrates no radiotracer accumulation within the LEFT ventricle. IMPRESSION: Visual and quantitative assessment (grade 0, H/CLL equal 1.1) are equivocal for transthyretin amyloidosis. Electronically Signed   By: Kerby Moors M.D.   On: 06/22/2019 16:47      Cardiac Studies   06/22/2019: TTE IMPRESSIONS 1. Left ventricular ejection fraction, by visual estimation, is 30 to 35%. The left ventricle has severely decreased function. Severely  increased left ventricular size. There is no left ventricular hypertrophy.  2. Definity contrast agent  was given IV to delineate the left ventricular endocardial borders.  3. Global hypokinesis, though function more preserved at base than apex. LV contrast excludes apical thrombus.  4. Global right ventricle has normal systolic function.The right ventricular size is normal. No increase in right ventricular wall thickness.  5. Left atrial size was normal.  6. Right atrial size was normal.  7. The mitral valve has been repaired/replaced. Mild mitral valve regurgitation. Moderate mitral stenosis.  8. The tricuspid valve is normal in structure. Tricuspid valve regurgitation mild-moderate.  9. 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 10. The aortic valve is normal in structure. Aortic valve regurgitation is trivial by color flow Doppler. Structurally normal aortic valve, with no evidence of sclerosis or stenosis. 11. Aortic valve with trivial perivalvular leak. 12. The pulmonic valve was grossly normal. Pulmonic valve regurgitation is not visualized by color flow Doppler. 13. S/P Bentall. 14. Moderately elevated pulmonary artery systolic pressure. 15. A pacer wire is visualized in the RV and RA. 16. The inferior vena cava is dilated in size with <50% respiratory variability, suggesting right atrial pressure of 15 mmHg. 17. History of mechanical aortic and mitral valves. Leaflets not well visualized, could consider fluroscopy of leaflets, but gradients not suggestive of obstruction.  RHCAug 2020 Hemodynamics (mmHg) RA mean 11 RV 46/6 PA 50/21, mean 33 PCWP mean 19  Oxygen saturations: PA 69% AO 100%  Cardiac Output (Fick) 6.24  Cardiac Index (Fick) 3.32 PVR 2.2 WU PAPI 2.63 CVP/PCWP 0.59   04/04/2019: TEE IMPRESSIONS 1. Left atrial size was severely dilated. Smoke in the left atrium. Cannot rule out a small LA appendage thrombus (at tip of LAA). 2. Right atrial size  was mildly dilated. 3. No PFO/ASD, negative bubble study. 4. Tricuspid valve regurgitation is mild-moderate. Peak RV-RA gradient 45 mmHg. 5. Mechanical mitral valve. There was mild peri-valvular regurgitation. Mean gradient across valve of 5 mmHg did not suggest significant stenosis. 6. Mechanical aortic valve. No significant regurgitation. Mean gradient 23 mmHg. The mean gradient is higher than expected but does not suggest severe stenosis. Visually, the valve appears to open well. 7. The left ventricle had a visually estimated ejection fraction of of 50%. The cavity size was normal. There was prominent septal-lateral dyssynchrony. 8. The right ventricle has normal systolc function. The cavity was normal.  FINDINGS Left Ventricle: The left ventricle has a visually estimated ejection fraction of of 50%. The cavity size was normal. There is no increase in left ventricular wall thickness.  Right Ventricle: The right ventricle has normal systolic function. The cavity was normal.  Left Atrium: Left atrial size was severely dilated.   Right Atrium: Right atrial size was mildly dilated. Right atrial pressure is estimated at 10 mmHg.  Interatrial Septum: No atrial level shunt detected by color flow Doppler.  Mitral Valve: The mitral valve has been repaired/replaced.  Tricuspid Valve: Tricuspid valve regurgitation is mild-moderate by color flow Doppler.  Aortic Valve: The aortic valve has been repaired/replaced.  Pulmonic Valve: The pulmonic valve was grossly normal.   Echo6/1/20 1. The left ventricle has normal systolic function, with an ejection fraction of 55-60%. The cavity size was normal. Left ventricular diastolic function could not be evaluated due to mitral valve replacement/repair. There is abnormal septal motion  consistent with left bundle branch block. 2. Mild hypokinesis of the left ventricular, entire septal wall. 3. The right ventricle has normal  systolic function. The cavity was normal. There is no increase in right ventricular wall thickness. Right ventricular  systolic pressure is severely elevated with an estimated pressure of 62.4 mmHg. 4. Left atrial size was severely dilated. 5. Right atrial size was severely dilated. 6. A 33 St. Jude mechanical valve is present in the mitral position. Procedure Date: 01/24/2015 There is no Echo evidence of rocking of the mitral prosthesis. Echo findings are consistent with perivalvular leak the mitral prosthesis. 7. Mitral valve regurgitation mild perivalvular. Mitral valve gradient appropriate for prosthesis mitral valve stenosis. 8. The tricuspid valve is grossly normal. Tricuspid valve regurgitation is moderate. 9. A 14mm St. Jude mechanical prosthesis valve is present in the aortic position. Normal aortic valve prosthesis. Echo findings are consistent with Mean gradient ~30 mmHg of the aortic prosthesis. 10. The aortic arch is normal in size and structure. 11. There is dilatation of the ascending aorta measuring 38 mm. 12. Post Bentall 1988. 13. The inferior vena cava was dilated in size with <50% respiratory variability.     Assessment & Plan    Ventricular Tachycardia/Flutter  Recurrent ? Triggering   NICM/ICM  w severe BAE -- acute deterioration 55-60%>> 30-35%   Valvular heart disease  S.p MVR/AVR  CHF acute/chronic  Hypokalemia   AFib-permanent   leukocytosis   Anemia chronic since 2016 with microcytosis       Will check haptoglobin for chronic hemolysis Replete K Will discuss with CHF regarding evaluation of incessant VT ie, does he need cath esp with change in EF    Signed, Virl Axe, MD  06/25/2019, 8:25 AM

## 2019-06-25 NOTE — Progress Notes (Addendum)
Patient ID: Lance Cook, male   DOB: 12-Oct-1960, 58 y.o.   MRN: [id number]     Advanced Heart Failure Rounding Note  PCP-Cardiologist: [name], MD   Subjective:    Remains on amio. Brief run of NSVT overnight.  On lasix gtt at 10/hr. Co-ox 55 -> 53%. Out 4.5L. CVP down to 11 Weights inaccurate Creatinine stable at 1.2 K 3.2  Sodium up to 129  Echo: EF 30-35% with global HK, normal RV size and systolic function, mechanical MV mean gradient 5.7 and mild MR, mechanical AoV mean gradient 12, dilated IVC.   PYP scan: Negative.    Objective:   Weight Range: 86.3 kg Body mass index is 37.16 kg/m.   Vital Signs:   Temp:  [98.1 F (36.7 C)-98.6 F (37 C)] 98.3 F (36.8 C) (10/24 1104) Pulse Rate:  [84-108] 89 (10/24 1104) Resp:  [18-25] 18 (10/24 1104) BP: (95-114)/(52-76) 95/60 (10/24 1104) SpO2:  [84 %-98 %] 98 % (10/24 1104) Weight:  [86.3 kg] 86.3 kg (10/24 0252) Last BM Date: 06/24/19  Weight change: Filed Weights   06/23/19 0600 06/24/19 0500 06/25/19 0252  Weight: 83.7 kg 82.1 kg 86.3 kg    Intake/Output:   Intake/Output Summary (Last 24 hours) at 06/25/2019 1535 Last data filed at 06/25/2019 1314 Gross per 24 hour  Intake 1754.61 ml  Output 4275 ml  Net -2520.39 ml      Physical Exam    CVP 11 General:  Sitting in chair. No resp difficulty HEENT: normal Neck: supple. JVP to jaw. Carotids 2+ bilat; no bruits. No lymphadenopathy or thryomegaly appreciated. Cor: PMI nondisplaced. IRR mech s1/s2 2/6 MR Lungs: clear Abdomen: soft, nontender, nondistended. No hepatosplenomegaly. No bruits or masses. Good bowel sounds. Extremities: no cyanosis, clubbing, rash, 1+ edema +UNNA Neuro: alert & orientedx3, cranial nerves grossly intact. moves all 4 extremities w/o difficulty. Affect pleasant   Telemetry   Atrial fibrillation in 80-90s, rate PVCs 6 beat run NSVT (personally reviewed).   Labs    CBC Recent Labs    06/25/19 0422  WBC 21.4*  HGB 10.1*   HCT 33.6*  MCV 74.8*  PLT 99991111   Basic Metabolic Panel Recent Labs    06/24/19 0320 06/24/19 1307 06/25/19 0422  NA 129* 127* 129*  K 3.9 4.0 3.2*  CL 89* 86* 86*  CO2 29 30 32  GLUCOSE 204* 247* 99  BUN 53* 49* 47*  CREATININE 1.16 1.22 1.22  CALCIUM 8.0* 7.9* 8.1*  MG 1.9  --  2.0   Liver Function Tests No results for input(s): AST, ALT, ALKPHOS, BILITOT, PROT, ALBUMIN in the last 72 hours. No results for input(s): LIPASE, AMYLASE in the last 72 hours. Cardiac Enzymes No results for input(s): CKTOTAL, CKMB, CKMBINDEX, TROPONINI in the last 72 hours.  BNP: BNP (last 3 results) Recent Labs    04/19/19 1316 04/25/19 1107 06/21/19 1752  BNP 195.6* 194.9* 381.5*    ProBNP (last 3 results) No results for input(s): PROBNP in the last 8760 hours.   D-Dimer No results for input(s): DDIMER in the last 72 hours. Hemoglobin A1C No results for input(s): HGBA1C in the last 72 hours. Fasting Lipid Panel No results for input(s): CHOL, HDL, LDLCALC, TRIG, CHOLHDL, LDLDIRECT in the last 72 hours. Thyroid Function Tests No results for input(s): TSH, T4TOTAL, T3FREE, THYROIDAB in the last 72 hours.  Invalid input(s): FREET3  Other results:   Imaging    No results found.   Medications:     Scheduled  Medications: . allopurinol  300 mg Oral Daily  . amiodarone  150 mg Intravenous Once  . aspirin  81 mg Oral Daily  . Chlorhexidine Gluconate Cloth  6 each Topical Daily  . digoxin  0.125 mg Oral Daily  . furosemide  40 mg Intravenous Once  . gabapentin  300 mg Oral TID  . Gerhardt's butt cream   Topical TID  . insulin aspart  0-15 Units Subcutaneous TID WC  . insulin aspart  0-5 Units Subcutaneous QHS  . magnesium oxide  400 mg Oral TID  . metoprolol succinate  12.5 mg Oral BID  . midodrine  10 mg Oral TID WC  . mupirocin ointment  1 application Topical TID  . [START ON 06/26/2019] potassium chloride  40 mEq Oral Daily  . potassium chloride  40 mEq Oral Once  .  sodium chloride flush  10-40 mL Intracatheter Q12H  . sodium chloride flush  3 mL Intravenous Once  . spironolactone  25 mg Oral Daily  . traZODone  100 mg Oral QHS  . warfarin  2.5 mg Oral ONCE-1800  . Warfarin - Pharmacist Dosing Inpatient   Does not apply q1800    Infusions: . amiodarone 30 mg/hr (06/25/19 1342)  . furosemide (LASIX) infusion 10 mg/hr (06/24/19 1800)    PRN Medications: acetaminophen, ALPRAZolam, nitroGLYCERIN, ondansetron (ZOFRAN) IV, oxyCODONE, phenol, sodium chloride flush, zolpidem   Assessment/Plan   1. 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  He has permanent atrial fibrillation. Dysfunction of his mechanical valves does not appear to explain his worsening CHF this year. 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 [place] 04/07/19 showed mildly elevated filling pressures.He was doing well until earlier this month, then has developed progressive volume overload and was admitted.  Echo done this admission after VT arrest with DCCV x 3 showed EF 30-35% with global HK, normal RV size and systolic function, mechanical MV mean gradient 5.7 and mild MR, mechanical AoV mean gradient 12, dilated IVC.  It is possible fall in EF is related to stunning from VT arrest. HS-TnI was not significantly elevated.  Remains on lasix gtt at 10 + tolvaptan and metoalzone. Diuresing well. CVP down to 11 (was 15-16)  this morning with co-ox 53%. Of note, PYP scan showed no evidence for ATTR amyloidosis.  - Valves sounds remain crisp on exam and gradients stable on echo.   - Continue Lasix 10 mg/hr today. Supp K -Continue spironolactone at 25 mg daily. - He is on midodrine 10 mg tid currently to maintain BP, will continue for now.  - Continue digoxin 0.125  daily.  - Unna boots - Patient will need ICD (see below). Would recommend BiV device given low EF and wide QRS/LBBB, will also discuss AV nodal ablation with Dr. [name].  2. Valvular heart disease: Patient has mechanical mitral and aortic valves. TEE in 8/20 showed mechanical MV with mean gradient 5 mmHg, mild peri-valvular regurgitation. The aortic valve appeared to open reasonably well but mean gradient was 23 mmHg, ?component of mild patient-prosthesis mismatch. Echo this admission showed stable gradients (lower for aortic valve).  - Continue ASA 81 and warfarin with INR goal 2.5-3.5 => INR 4.9 -> 3.2, warfarin on hold for now.  Can be in the 2s range for CRT-D implantation.  Cover with heparin gtt if  INR drops below 2.5 3. Atrial fibrillation: Chronic. Long-standing atrial fibrillation with severely dilated atria, unlikely to have a successful cardioversion. - Rate control with amiodarone gtt post-arrest.  -Continue Toprol XL at current dose.  - Need to consider AV nodal ablation with CRT-D implantation. EP following 4. VT: Patient with VT this admission, rate 200s, monomorphic.  He has not had episodes of palpitations or dizziness at home prior to admission.  He did not lose consciousness or require CPR.  Cardioversion x 3 failed, he converted back to atrial fibrillation after amiodarone boluses and remains in atrial fibrillation currently.  No chest pain and HS-TnI not significantly elevated, think ACS is unlikely. He was hypokalemic.  Cath in 4/16 with no significant disease. Brief NSVT overnight - Continue amiodarone gtt for now.  - Keep K> 4.0 Mg > 2.0  - D/w Dr. Caryl Comes this am an he raised the question of need for coronary angio to exclude ischemia as trigger. With normal cors in 4/16 and flat hstrop doubt clear ischemic trigger but I will d/w Dr. [name]  - Likely needs CRT-D prior to discharge => plan for Monday, need INR in 2s range.  5. CAD: Last cath in 4/16 with patent native  coronaries, probably occluded SVG-RCA. Doubt CAD as cause of his decompensation. No chest pain with minimally elevated HS-TnI.  6. Type 2 diabetes: SSI.  7. OSA: CPAP. 8. Anemia, iron-deficient: received feraheme 02/22/2019 and again on 04/05/19 but had severe reaction to feraheme.Placed on allergy list. 9. Hyponatremia: Sodium improved to 129.  Fluid restrict and follow closely.  10. Hypokalemia -supp 11. ID - WBC rising now up to 21k - Afebrile. No localizing signs or symptoms but with 2 prosthetic valves at high risk for endocarditis - check BCX x2 and UA 12. Deconditioning - Mobilize, PT.    Mobilize.    Length of Stay: [place], MD  06/25/2019, 3:35 PM  Advanced Heart Failure Team Pager [phone] (M-F; 7a - 4p)  Please contact [facility] for night-coverage after hours (4p -7a ) and weekends on [email or web address]

## 2019-06-25 NOTE — Progress Notes (Signed)
[place] for warfarin Indication: Mechanical AVR [name] 1988), mech Mitral valve 5/16, Afib (CHADS2VASc = 4)  Allergies  Allergen Reactions  . Feraheme [Ferumoxytol] Shortness Of Breath    SOB/Tachycardia  . Penicillins Hives and Rash    Has patient had a PCN reaction causing immediate rash, facial/tongue/throat swelling, SOB or lightheadedness with hypotension: Yes Has patient had a PCN reaction causing severe rash involving mucus membranes or skin necrosis: Yes Has patient had a PCN reaction that required hospitalization unsure Has patient had a PCN reaction occurring within the last 10 years: 2010-2012 If all of the above answers are "NO", then may proceed with Cephalosporin use.  [name] Other (See Comments)    CDIFF, when taking oral tablets   . Tape Other (See Comments) and Itching    Skin irritation    Patient Measurements: Height: 5' (152.4 cm) Weight: 190 lb 4.1 oz (86.3 kg) IBW/kg (Calculated) : 50   Vital Signs: Temp: 98.6 F (37 C) (10/24 0749) Temp Source: Oral (10/24 0749) BP: 99/58 (10/24 0948) Pulse Rate: 84 (10/24 0948)  Labs: Recent Labs    06/22/19 1030 06/22/19 1954  06/23/19 1441 06/24/19 0320 06/24/19 1307 06/25/19 0422  HGB  --   --   --   --   --   --  10.1*  HCT  --   --   --   --   --   --  33.6*  PLT  --   --   --   --   --   --  205  LABPROT  --   --    < > 36.7* 44.7*  --  32.5*  INR  --   --    < > 3.8* 4.9*  --  3.2*  CREATININE  --  1.52*   < > 1.35* 1.16 1.22 1.22  TROPONINIHS 22* 30*  --   --   --   --   --    < > = values in this interval not displayed.    Estimated Creatinine Clearance: 60.9 mL/min (by C-G formula based on SCr of 1.22 mg/dL).   Assessment: 58 y.o. 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   Home dose is 5mg  daily, last taken 10/19. INR fluctuating outpatient  - was therapeutic on 10/13 at 2.6 but INR 1.2 on 10/6 and 2.6 on 9/29.   INR up 4.9 yesterday, now down to 3.2 after holding dose x 2 days.   Started on IV amiodarone 10/21 for VT, will need to watch INR closely.   Pt on the schedule for ICD implant today but procedure rescheduled for Monday since INR is too high.  Goal of Therapy:  INR 2.5 - 3.5 Monitor platelets by anticoagulation protocol: Yes   Plan:  Give low dose warfarin 2.5mg  tonight - expect INR to continue to fall over weekend Ideal INR < 3 for ICD on Monday, also question need for repeat LHC Daily INR Monitor for bleeding   Erin Hearing PharmD., BCPS Clinical Pharmacist 06/25/2019 10:20 AM  Please check [email or web address] for unit-specific pharmacist phone numbers

## 2019-06-26 ENCOUNTER — Other Ambulatory Visit: Payer: Self-pay

## 2019-06-26 DIAGNOSIS — I482 Chronic atrial fibrillation, unspecified: Secondary | ICD-10-CM | POA: Diagnosis not present

## 2019-06-26 DIAGNOSIS — I5033 Acute on chronic diastolic (congestive) heart failure: Secondary | ICD-10-CM | POA: Diagnosis not present

## 2019-06-26 DIAGNOSIS — I472 Ventricular tachycardia: Secondary | ICD-10-CM | POA: Diagnosis not present

## 2019-06-26 DIAGNOSIS — I5043 Acute on chronic combined systolic (congestive) and diastolic (congestive) heart failure: Secondary | ICD-10-CM | POA: Diagnosis not present

## 2019-06-26 LAB — PROTIME-INR
INR: 2.8 — ABNORMAL HIGH (ref 0.8–1.2)
Prothrombin Time: 29.1 seconds — ABNORMAL HIGH (ref 11.4–15.2)

## 2019-06-26 LAB — COOXEMETRY PANEL
Carboxyhemoglobin: 0.5 % (ref 0.5–1.5)
Methemoglobin: 0.7 % (ref 0.0–1.5)
O2 Saturation: 56.5 %
Total hemoglobin: 14.4 g/dL (ref 12.0–16.0)

## 2019-06-26 LAB — BASIC METABOLIC PANEL
Anion gap: 11 (ref 5–15)
BUN: 44 mg/dL — ABNORMAL HIGH (ref 6–20)
CO2: 33 mmol/L — ABNORMAL HIGH (ref 22–32)
Calcium: 7.8 mg/dL — ABNORMAL LOW (ref 8.9–10.3)
Chloride: 83 mmol/L — ABNORMAL LOW (ref 98–111)
Creatinine, Ser: 1.27 mg/dL — ABNORMAL HIGH (ref 0.61–1.24)
GFR calc Af Amer: >60 mL/min (ref >60)
GFR calc non Af Amer: >60 mL/min (ref >60)
Glucose, Bld: 82 mg/dL (ref 70–99)
Potassium: 3.4 mmol/L — ABNORMAL LOW (ref 3.5–5.1)
Sodium: 127 mmol/L — ABNORMAL LOW (ref 135–145)

## 2019-06-26 LAB — GLUCOSE, CAPILLARY
Glucose-Capillary: 88 mg/dL (ref 70–99)
Glucose-Capillary: 93 mg/dL (ref 70–99)
Glucose-Capillary: 77 mg/dL (ref 70–99)
Glucose-Capillary: 118 mg/dL — ABNORMAL HIGH (ref 70–99)

## 2019-06-26 LAB — MAGNESIUM: Magnesium: 2.1 mg/dL (ref 1.7–2.4)

## 2019-06-26 LAB — CBC
HCT: 31.3 % — ABNORMAL LOW (ref 39.0–52.0)
Hemoglobin: 9.4 g/dL — ABNORMAL LOW (ref 13.0–17.0)
MCH: 22.3 pg — ABNORMAL LOW (ref 26.0–34.0)
MCHC: 30 g/dL (ref 30.0–36.0)
MCV: 74.3 fL — ABNORMAL LOW (ref 80.0–100.0)
Platelets: 201 10*3/uL (ref 150–400)
RBC: 4.21 MIL/uL — ABNORMAL LOW (ref 4.22–5.81)
RDW: 19.8 % — ABNORMAL HIGH (ref 11.5–15.5)
WBC: 17.1 10*3/uL — ABNORMAL HIGH (ref 4.0–10.5)
nRBC: 0.1 % (ref 0.0–0.2)

## 2019-06-26 MED ORDER — POTASSIUM CHLORIDE CRYS ER 20 MEQ PO TBCR
40.0000 meq | EXTENDED_RELEASE_TABLET | Freq: Once | ORAL | Status: AC
  Administered 2019-06-26: 06:00:00 40 meq via ORAL
  Filled 2019-06-26: qty 2

## 2019-06-26 MED ORDER — POTASSIUM CHLORIDE CRYS ER 20 MEQ PO TBCR
60.0000 meq | EXTENDED_RELEASE_TABLET | Freq: Once | ORAL | Status: AC
  Administered 2019-06-26: 17:00:00 60 meq via ORAL
  Filled 2019-06-26: qty 3

## 2019-06-26 MED ORDER — METOLAZONE 5 MG PO TABS
5.0000 mg | ORAL_TABLET | Freq: Once | ORAL | Status: AC
  Administered 2019-06-26: 5 mg via ORAL
  Filled 2019-06-26: qty 1

## 2019-06-26 MED ORDER — WARFARIN SODIUM 2 MG PO TABS
2.0000 mg | ORAL_TABLET | Freq: Once | ORAL | Status: AC
  Administered 2019-06-26: 2 mg via ORAL
  Filled 2019-06-26: qty 1

## 2019-06-26 MED ORDER — WARFARIN SODIUM 1 MG PO TABS: 1.0000 mg | ORAL_TABLET | Freq: Once | ORAL | Status: DC

## 2019-06-26 NOTE — Progress Notes (Signed)
Patient ID: Lance Cook, male   DOB: June 26, 1961, 58 y.o.   MRN: [id number]     Advanced Heart Failure Rounding Note  PCP-Cardiologist: [name], MD   Subjective:    Remains on amio. Brief run of NSVT overnight.  On lasix gtt at 10/hr. Co-ox 57%. I/Os even. Weights seem inaccurate Creatinine stable at 1.27 K 3.4  Sodium 127. Continues to drink a lot of water. CVP 13  Feels ok. Denies CP or SOB. No orthopnea or PND. No further VT  WBC 21K -> 17k. [place]. UA ok  Afebrile   Echo: EF 30-35% with global HK, normal RV size and systolic function, mechanical MV mean gradient 5.7 and mild MR, mechanical AoV mean gradient 12, dilated IVC.   PYP scan: Negative.    Objective:   Weight Range: 84.9 kg Body mass index is 36.55 kg/m.   Vital Signs:   Temp:  [97.7 F (36.5 C)-98 F (36.7 C)] 98 F (36.7 C) (10/25 1049) Pulse Rate:  [61-93] 81 (10/25 1100) Resp:  [16-23] 23 (10/25 1100) BP: (82-106)/(52-70) 100/56 (10/25 1049) SpO2:  [93 %-100 %] 96 % (10/25 1100) Weight:  [84.9 kg] 84.9 kg (10/25 0305) Last BM Date: 06/26/19  Weight change: Filed Weights   06/24/19 0500 06/25/19 0252 06/26/19 0305  Weight: 82.1 kg 86.3 kg 84.9 kg    Intake/Output:   Intake/Output Summary (Last 24 hours) at 06/26/2019 1412 Last data filed at 06/26/2019 1300 Gross per 24 hour  Intake 1914.38 ml  Output 1600 ml  Net 314.38 ml      Physical Exam    General:  Sitting in chair . No resp difficulty HEENT: normal Neck: supple. JVP to jaw. Carotids 2+ bilat; no bruits. No lymphadenopathy or thryomegaly appreciated. Cor: PMI nondisplaced. IRR IRR  mech s1/s2 2/6 MR Lungs: clear Abdomen: soft, nontender, + distended. No hepatosplenomegaly. No bruits or masses. Good bowel sounds. Extremities: no cyanosis, clubbing, rash, 3+ edema into thighs. + UNNA boots Neuro: alert & orientedx3, cranial nerves grossly intact. moves all 4 extremities w/o difficulty. Affect pleasant    Telemetry    Atrial fibrillation in 80-90s, rate PVCs 6 beat run NSVT (personally reviewed).   Labs    CBC Recent Labs    06/25/19 0422 06/26/19 0435  WBC 21.4* 17.1*  HGB 10.1* 9.4*  HCT 33.6* 31.3*  MCV 74.8* 74.3*  PLT 205 [id number]   Basic Metabolic Panel Recent Labs    06/25/19 0422 06/26/19 0435  NA 129* 127*  K 3.2* 3.4*  CL 86* 83*  CO2 32 33*  GLUCOSE 99 82  BUN 47* 44*  CREATININE 1.22 1.27*  CALCIUM 8.1* 7.8*  MG 2.0 2.1   Liver Function Tests No results for input(s): AST, ALT, ALKPHOS, BILITOT, PROT, ALBUMIN in the last 72 hours. No results for input(s): LIPASE, AMYLASE in the last 72 hours. Cardiac Enzymes No results for input(s): CKTOTAL, CKMB, CKMBINDEX, TROPONINI in the last 72 hours.  BNP: BNP (last 3 results) Recent Labs    04/19/19 1316 04/25/19 1107 06/21/19 1752  BNP 195.6* 194.9* 381.5*    ProBNP (last 3 results) No results for input(s): PROBNP in the last 8760 hours.   D-Dimer No results for input(s): DDIMER in the last 72 hours. Hemoglobin A1C No results for input(s): HGBA1C in the last 72 hours. Fasting Lipid Panel No results for input(s): CHOL, HDL, LDLCALC, TRIG, CHOLHDL, LDLDIRECT in the last 72 hours. Thyroid Function Tests No results for input(s): TSH, T4TOTAL, T3FREE, THYROIDAB  in the last 72 hours.  Invalid input(s): FREET3  Other results:   Imaging    No results found.   Medications:     Scheduled Medications: . allopurinol  300 mg Oral Daily  . aspirin  81 mg Oral Daily  . Chlorhexidine Gluconate Cloth  6 each Topical Daily  . digoxin  0.125 mg Oral Daily  . furosemide  40 mg Intravenous Once  . gabapentin  300 mg Oral TID  . Gerhardt's butt cream   Topical TID  . insulin aspart  0-15 Units Subcutaneous TID WC  . insulin aspart  0-5 Units Subcutaneous QHS  . magnesium oxide  400 mg Oral TID  . metoprolol succinate  12.5 mg Oral BID  . midodrine  10 mg Oral TID WC  . mupirocin ointment  1 application Topical TID  .  potassium chloride  40 mEq Oral BID  . sodium chloride flush  10-40 mL Intracatheter Q12H  . sodium chloride flush  3 mL Intravenous Once  . spironolactone  25 mg Oral Daily  . traZODone  100 mg Oral QHS  . warfarin  2 mg Oral ONCE-1800  . Warfarin - Pharmacist Dosing Inpatient   Does not apply q1800    Infusions: . amiodarone 30 mg/hr (06/26/19 1243)  . furosemide (LASIX) infusion 10 mg/hr (06/26/19 1200)    PRN Medications: acetaminophen, ALPRAZolam, nitroGLYCERIN, ondansetron (ZOFRAN) IV, oxyCODONE, phenol, sodium chloride flush, zolpidem   Assessment/Plan   1. 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  He has permanent atrial fibrillation. Dysfunction of his mechanical valves does not appear to explain his worsening CHF this year. 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 [place] 04/07/19 showed mildly elevated filling pressures.He was doing well until earlier this month, then has developed progressive volume overload and was admitted.  Echo done this admission after VT arrest with DCCV x 3 showed EF 30-35% with global HK, normal RV size and systolic function, mechanical MV mean gradient 5.7 and mild MR, mechanical AoV mean gradient 12, dilated IVC.  It is possible fall in EF is related to stunning from VT arrest. HS-TnI was not significantly elevated.  Remains on lasix gtt at 10 + tolvaptan and metoalzone. Diuresing well. But  CVP still elevated was 12-13 this morning with co-ox 57%. Of note, PYP scan showed no evidence for ATTR amyloidosis.  - Valves sounds remain crisp on exam and gradients stable on echo.   - Increase lasix 15 mg/hr today. Supp K - Give metolazone 5mg  today -Continue spironolactone at 25 mg daily. - He is on midodrine 10 mg tid  currently to maintain BP, will continue for now.  - Continue digoxin 0.125 daily.  - Unna boots - D/w Dr. [name]. Pending AVN ablation and CRT-D tomorrow 2. Valvular heart disease: Patient has mechanical mitral and aortic valves. TEE in 8/20 showed mechanical MV with mean gradient 5 mmHg, mild peri-valvular regurgitation. The aortic valve appeared to open reasonably well but mean gradient was 23 mmHg, ?component of mild patient-prosthesis mismatch. Echo this admission showed stable gradients (lower for aortic valve).  - Continue ASA 81 and warfarin with INR goal 2.5-3.5 => INR 4.9 -> 3.2 -> 2.8 , warfarin on hold. Wil give half dose tonight per Dr. [name] [place] with heparin gtt if INR drops below 2.5. Discussed  dosing with PharmD personally. 3. Atrial fibrillation: Chronic. Long-standing atrial fibrillation with severely dilated atria, unlikely to have a successful cardioversion. - Rate control with amiodarone gtt post-arrest.  -Continue Toprol XL at current dose.  - D/w Dr. [name]. Pending AVN ablation and CRT-D tomorrow 4. VT: Patient with VT this admission, rate 200s, monomorphic.  He has not had episodes of palpitations or dizziness at home prior to admission.  He did not lose consciousness or require CPR.  Cardioversion x 3 failed, he converted back to atrial fibrillation after amiodarone boluses and remains in atrial fibrillation currently.  No chest pain and HS-TnI not significantly elevated, think ACS is unlikely. He was hypokalemic.  Cath in 4/16 with no significant disease. No further VT overnight - Continue amiodarone gtt for now.  - Keep K> 4.0 Mg > 2.0  - D/w Dr. [name]. Pending AVN ablation and CRT-D tomorrow 5. CAD: Last cath in 4/16 with patent native coronaries, probably occluded SVG-RCA. Doubt CAD as cause of his decompensation. No chest pain with minimally elevated HS-TnI. Would not repeat coronary angio at this time.  6. Type 2 diabetes: SSI.  7. OSA: CPAP. 8. Anemia,  iron-deficient: received feraheme 02/22/2019 and again on 04/05/19 but had severe reaction to feraheme.Placed on allergy list. 9. Hyponatremia: Sodium improved to 129.  Fluid restrict and follow closely.  10. Hypokalemia -supp 11. ID - WBC rising now up to 21k yesterday. No fevers. Back to 17k - UA negative. Bcx NGTD - follow  12. Deconditioning - Mobilize, PT.     Length of Stay: [place], MD  06/26/2019, 2:12 PM  Advanced Heart Failure Team Pager [phone] (M-F; 7a - 4p)  Please contact [facility] for night-coverage after hours (4p -7a ) and weekends on [email or web address]

## 2019-06-26 NOTE — Progress Notes (Signed)
[name] for warfarin Indication: Mechanical AVR Lance Cook 1988), mech Mitral valve 5/16, Afib (CHADS2VASc = 4)  Allergies  Allergen Reactions  . Feraheme [Ferumoxytol] Shortness Of Breath    SOB/Tachycardia  . Penicillins Hives and Rash    Has patient had a PCN reaction causing immediate rash, facial/tongue/throat swelling, SOB or lightheadedness with hypotension: Yes Has patient had a PCN reaction causing severe rash involving mucus membranes or skin necrosis: Yes Has patient had a PCN reaction that required hospitalization unsure Has patient had a PCN reaction occurring within the last 10 years: 2010-2012 If all of the above answers are "NO", then may proceed with Cephalosporin use.  Lance Cook Other (See Comments)    CDIFF, when taking oral tablets   . Tape Other (See Comments) and Itching    Skin irritation    Patient Measurements: Height: 5' (152.4 cm) Weight: 187 lb 2.7 oz (84.9 kg) IBW/kg (Calculated) : 50   Vital Signs: Temp: 98 F (36.7 C) (10/25 0400) Temp Source: Oral (10/25 0400) BP: 94/62 (10/25 0400) Pulse Rate: 76 (10/25 0500)  Labs: Recent Labs    06/24/19 0320 06/24/19 1307 06/25/19 0422 06/26/19 0435  HGB  --   --  10.1* 9.4*  HCT  --   --  33.6* 31.3*  PLT  --   --  205 201  LABPROT 44.7*  --  32.5* 29.1*  INR 4.9*  --  3.2* 2.8*  CREATININE 1.16 1.22 1.22 1.27*    Estimated Creatinine Clearance: 58.1 mL/min (A) (by C-G formula based on SCr of 1.27 mg/dL (H)).   Assessment: 58 y.o. 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   Home dose is 5mg  daily, last taken 10/19. INR fluctuating outpatient - was therapeutic on 10/13 at 2.6 but INR 1.2 on 10/6 and 2.6 on 9/29.   INR up 4.9 10/23, now down to 2.8 (held x 2d). Started on IV amiodarone 10/21 for VT, will need to watch INR closely.   Hope to  implant ICD this week if stable and INR stays down. Will give low dose warfarin in hopes INR does not bump but am hesitant to hold dosing entirely as INR may drift down below 2.   Goal of Therapy:  INR 2.5 - 3.5 Monitor platelets by anticoagulation protocol: Yes   Plan:  Give low dose warfarin 1mg  tonight - expect INR to continue to fall  Ideal INR < 3 for ICD on Monday, also question need for repeat LHC Daily INR Monitor for bleeding   Erin Hearing PharmD., BCPS Clinical Pharmacist 06/26/2019 7:57 AM  Please check [email or web address] for unit-specific pharmacist phone numbers

## 2019-06-26 NOTE — Progress Notes (Signed)
Progress Note  Patient Name: Lance Cook Date of Encounter: 06/26/2019  Primary Cardiologist: Lance Breeding, MD   Subjective   No chest pain or sob.   Inpatient Medications    Scheduled Meds: . allopurinol  300 mg Oral Daily  . aspirin  81 mg Oral Daily  . Chlorhexidine Gluconate Cloth  6 each Topical Daily  . digoxin  0.125 mg Oral Daily  . furosemide  40 mg Intravenous Once  . gabapentin  300 mg Oral TID  . Gerhardt's butt cream   Topical TID  . insulin aspart  0-15 Units Subcutaneous TID WC  . insulin aspart  0-5 Units Subcutaneous QHS  . magnesium oxide  400 mg Oral TID  . metoprolol succinate  12.5 mg Oral BID  . midodrine  10 mg Oral TID WC  . mupirocin ointment  1 application Topical TID  . potassium chloride  40 mEq Oral BID  . sodium chloride flush  10-40 mL Intracatheter Q12H  . sodium chloride flush  3 mL Intravenous Once  . spironolactone  25 mg Oral Daily  . traZODone  100 mg Oral QHS  . warfarin  1 mg Oral ONCE-1800  . Warfarin - Pharmacist Dosing Inpatient   Does not apply q1800   Continuous Infusions: . amiodarone 30 mg/hr (06/25/19 2353)  . furosemide (LASIX) infusion 10 mg/hr (06/25/19 2056)   PRN Meds: acetaminophen, ALPRAZolam, nitroGLYCERIN, ondansetron (ZOFRAN) IV, oxyCODONE, phenol, sodium chloride flush, zolpidem   Vital Signs    Vitals:   06/26/19 0305 06/26/19 0400 06/26/19 0500 06/26/19 0745  BP:  94/62  106/62  Pulse:  75 76 93  Resp:  (!) 21 19 16   Temp:  98 F (36.7 C)  98 F (36.7 C)  TempSrc:  Oral  Oral  SpO2:  93% 95% 100%  Weight: 84.9 kg     Height:        Intake/Output Summary (Last 24 hours) at 06/26/2019 0920 Last data filed at 06/26/2019 0809 Gross per 24 hour  Intake 1845.15 ml  Output 1900 ml  Net -54.85 ml   Filed Weights   06/24/19 0500 06/25/19 0252 06/26/19 0305  Weight: 82.1 kg 86.3 kg 84.9 kg    Telemetry    Atrial fib with a controlled VR - Personally Reviewed  ECG    Atrial fib with LBBB -  Personally Reviewed  Physical Exam   GEN: No acute distress.   Neck: No JVD Cardiac: IRRR, no murmurs, rubs, or gallops.  Respiratory: Clear to auscultation bilaterally. GI: Soft, nontender, non-distended  MS: No edema; No deformity. Neuro:  Nonfocal  Psych: Normal affect   Labs    Chemistry Recent Labs  Lab 06/21/19 1752  06/24/19 1307 06/25/19 0422 06/26/19 0435  NA  --    < > 127* 129* 127*  K  --    < > 4.0 3.2* 3.4*  CL  --    < > 86* 86* 83*  CO2  --    < > 30 32 33*  GLUCOSE  --    < > 247* 99 82  BUN  --    < > 49* 47* 44*  CREATININE  --    < > 1.22 1.22 1.27*  CALCIUM  --    < > 7.9* 8.1* 7.8*  PROT 5.2*  --   --   --   --   ALBUMIN 1.8*  --   --   --   --   AST 27  --   --   --   --  ALT 11  --   --   --   --   ALKPHOS 64  --   --   --   --   BILITOT 1.0  --   --   --   --   GFRNONAA  --    < > >60 >60 >60  GFRAA  --    < > >60 >60 >60  ANIONGAP  --    < > 11 11 11    < > = values in this interval not displayed.     Hematology Recent Labs  Lab 06/21/19 1507 06/25/19 0422 06/26/19 0435  WBC 16.8* 21.4* 17.1*  RBC 4.25 4.49 4.21*  HGB 9.5* 10.1* 9.4*  HCT 31.2* 33.6* 31.3*  MCV 73.4* 74.8* 74.3*  MCH 22.4* 22.5* 22.3*  MCHC 30.4 30.1 30.0  RDW 19.5* 19.9* 19.8*  PLT 205 205 201    Cardiac EnzymesNo results for input(s): TROPONINI in the last 168 hours. No results for input(s): TROPIPOC in the last 168 hours.   BNP Recent Labs  Lab 06/21/19 1752  BNP 381.5*     DDimer No results for input(s): DDIMER in the last 168 hours.   Radiology    No results found.  Cardiac Studies   none  Patient Profile     58 y.o. male admitted with worsening CHF and developed VT, hemodynamically stable, found to have new LV dysfunction in the setting of LBBB.  Assessment & Plan    1. VT - he will undergo ICD insertion tomorrow unless he has fever today. His VT has been quiet on amiodarone. 2. Chronic systolic and diastolic heart failure - his EF is  newly down. We will plan to place an LV lead.  3. Atrial fib - this has been chronic for many years. His rates are controlled. 4. Chronic anti-coagulation - his INR is down to 2.8 today. We will start back half dose coumadin. I suspect he will need less long term due to the amiodarone.  Lance Cook,M.D.  For questions or updates, please contact [place] Please consult [email or web address] for contact info under Cardiology/STEMI.      Signed, Lance Peru, MD  06/26/2019, 9:20 AM  Patient ID: Lance Cook, male   DOB: 1961/08/25, 58 y.o.   MRN: [id number]

## 2019-06-27 ENCOUNTER — Inpatient Hospital Stay (HOSPITAL_COMMUNITY)
Admission: EM | Disposition: A | Discharge status: Home Health | Payer: Medicare HMO | Source: Ambulatory Visit | Attending: Internal Medicine

## 2019-06-27 ENCOUNTER — Encounter (HOSPITAL_COMMUNITY): Payer: Self-pay | Admitting: Nurse Practitioner

## 2019-06-27 ENCOUNTER — Encounter (HOSPITAL_COMMUNITY): Payer: Medicare HMO | Admitting: Cardiology

## 2019-06-27 DIAGNOSIS — I5022 Chronic systolic (congestive) heart failure: Secondary | ICD-10-CM

## 2019-06-27 DIAGNOSIS — I428 Other cardiomyopathies: Secondary | ICD-10-CM

## 2019-06-27 HISTORY — PX: BIV ICD INSERTION CRT-D: EP1195

## 2019-06-27 LAB — BASIC METABOLIC PANEL
Anion gap: 9 (ref 5–15)
BUN: 46 mg/dL — ABNORMAL HIGH (ref 6–20)
CO2: 33 mmol/L — ABNORMAL HIGH (ref 22–32)
Calcium: 8 mg/dL — ABNORMAL LOW (ref 8.9–10.3)
Chloride: 87 mmol/L — ABNORMAL LOW (ref 98–111)
Creatinine, Ser: 1.33 mg/dL — ABNORMAL HIGH (ref 0.61–1.24)
GFR calc Af Amer: >60 mL/min (ref >60)
GFR calc non Af Amer: 59 mL/min — ABNORMAL LOW (ref >60)
Glucose, Bld: 85 mg/dL (ref 70–99)
Potassium: 3.9 mmol/L (ref 3.5–5.1)
Sodium: 129 mmol/L — ABNORMAL LOW (ref 135–145)

## 2019-06-27 LAB — URINE CULTURE

## 2019-06-27 LAB — CBC
HCT: 31.6 % — ABNORMAL LOW (ref 39.0–52.0)
Hemoglobin: 9.5 g/dL — ABNORMAL LOW (ref 13.0–17.0)
MCH: 22.4 pg — ABNORMAL LOW (ref 26.0–34.0)
MCHC: 30.1 g/dL (ref 30.0–36.0)
MCV: 74.5 fL — ABNORMAL LOW (ref 80.0–100.0)
Platelets: 195 10*3/uL (ref 150–400)
RBC: 4.24 MIL/uL (ref 4.22–5.81)
RDW: 19.8 % — ABNORMAL HIGH (ref 11.5–15.5)
WBC: 17.7 10*3/uL — ABNORMAL HIGH (ref 4.0–10.5)
nRBC: 0 % (ref 0.0–0.2)

## 2019-06-27 LAB — GLUCOSE, CAPILLARY
Glucose-Capillary: 90 mg/dL (ref 70–99)
Glucose-Capillary: 74 mg/dL (ref 70–99)
Glucose-Capillary: 95 mg/dL (ref 70–99)
Glucose-Capillary: 133 mg/dL — ABNORMAL HIGH (ref 70–99)

## 2019-06-27 LAB — MAGNESIUM: Magnesium: 2 mg/dL (ref 1.7–2.4)

## 2019-06-27 LAB — SURGICAL PCR SCREEN
MRSA, PCR: NEGATIVE
Staphylococcus aureus: NEGATIVE

## 2019-06-27 LAB — PROTIME-INR
INR: 2.2 — ABNORMAL HIGH (ref 0.8–1.2)
Prothrombin Time: 24.4 seconds — ABNORMAL HIGH (ref 11.4–15.2)

## 2019-06-27 LAB — COOXEMETRY PANEL
Carboxyhemoglobin: 2 % — ABNORMAL HIGH (ref 0.5–1.5)
Methemoglobin: 1.4 % (ref 0.0–1.5)
O2 Saturation: 64 %
Total hemoglobin: 11.4 g/dL — ABNORMAL LOW (ref 12.0–16.0)

## 2019-06-27 SURGERY — BIV ICD INSERTION CRT-D

## 2019-06-27 MED ORDER — HEPARIN (PORCINE) IN NACL 1000-0.9 UT/500ML-% IV SOLN
INTRAVENOUS | Status: AC
  Filled 2019-06-27: qty 500

## 2019-06-27 MED ORDER — CHLORHEXIDINE GLUCONATE 4 % EX LIQD: 60.0000 mL | Freq: Once | CUTANEOUS | Status: AC

## 2019-06-27 MED ORDER — METOLAZONE 2.5 MG PO TABS
2.5000 mg | ORAL_TABLET | Freq: Once | ORAL | Status: AC
  Administered 2019-06-27: 2.5 mg via ORAL
  Filled 2019-06-27: qty 1

## 2019-06-27 MED ORDER — FENTANYL CITRATE (PF) 100 MCG/2ML IJ SOLN
INTRAMUSCULAR | Status: AC
  Filled 2019-06-27: qty 2

## 2019-06-27 MED ORDER — LIDOCAINE HCL (PF) 1 % IJ SOLN
INTRAMUSCULAR | Status: DC | PRN
  Administered 2019-06-27: 60 mL

## 2019-06-27 MED ORDER — MIDAZOLAM HCL 5 MG/5ML IJ SOLN
INTRAMUSCULAR | Status: AC
  Filled 2019-06-27: qty 5

## 2019-06-27 MED ORDER — SODIUM CHLORIDE 0.9 % IV SOLN
INTRAVENOUS | Status: AC
  Filled 2019-06-27: qty 2

## 2019-06-27 MED ORDER — HEPARIN (PORCINE) 25000 UT/250ML-% IV SOLN
1000.0000 [IU]/h | INTRAVENOUS | Status: DC
  Administered 2019-06-27: 10:00:00 1000 [IU]/h via INTRAVENOUS
  Filled 2019-06-27: qty 250

## 2019-06-27 MED ORDER — IOHEXOL 350 MG/ML SOLN
INTRAVENOUS | Status: DC | PRN
  Administered 2019-06-27: 15 mL

## 2019-06-27 MED ORDER — MIDAZOLAM HCL 5 MG/5ML IJ SOLN
INTRAMUSCULAR | Status: DC | PRN
  Administered 2019-06-27 (×2): 1 mg via INTRAVENOUS

## 2019-06-27 MED ORDER — POTASSIUM CHLORIDE CRYS ER 20 MEQ PO TBCR
40.0000 meq | EXTENDED_RELEASE_TABLET | Freq: Two times a day (BID) | ORAL | Status: AC
  Administered 2019-06-27 (×2): 40 meq via ORAL
  Filled 2019-06-27 (×2): qty 2

## 2019-06-27 MED ORDER — CHLORHEXIDINE GLUCONATE 4 % EX LIQD
60.0000 mL | Freq: Once | CUTANEOUS | Status: AC
  Administered 2019-06-27: 4 via TOPICAL
  Filled 2019-06-27: qty 60

## 2019-06-27 MED ORDER — LIDOCAINE HCL 1 % IJ SOLN
INTRAMUSCULAR | Status: AC
  Filled 2019-06-27: qty 60

## 2019-06-27 MED ORDER — WARFARIN SODIUM 4 MG PO TABS: 4.0000 mg | ORAL_TABLET | Freq: Once | ORAL | Status: DC

## 2019-06-27 MED ORDER — SODIUM CHLORIDE 0.9 % IV SOLN
80.0000 mg | INTRAVENOUS | Status: AC
  Administered 2019-06-27: 80 mg

## 2019-06-27 MED ORDER — VANCOMYCIN HCL IN DEXTROSE 1-5 GM/200ML-% IV SOLN
INTRAVENOUS | Status: AC
  Filled 2019-06-27: qty 200

## 2019-06-27 MED ORDER — WARFARIN SODIUM 2 MG PO TABS
2.0000 mg | ORAL_TABLET | Freq: Once | ORAL | Status: AC
  Administered 2019-06-27: 17:00:00 2 mg via ORAL
  Filled 2019-06-27: qty 1

## 2019-06-27 MED ORDER — VANCOMYCIN HCL IN DEXTROSE 1-5 GM/200ML-% IV SOLN
1000.0000 mg | INTRAVENOUS | Status: AC
  Administered 2019-06-27: 13:00:00 1000 mg via INTRAVENOUS
  Filled 2019-06-27: qty 200

## 2019-06-27 MED ORDER — HEPARIN (PORCINE) IN NACL 1000-0.9 UT/500ML-% IV SOLN
INTRAVENOUS | Status: DC | PRN
  Administered 2019-06-27: 500 mL

## 2019-06-27 SURGICAL SUPPLY — 14 items
CABLE SURGICAL S-101-97-12 (CABLE) ×3 IMPLANT
CATH ACUITYPRO 45CM W 9F (CATHETERS) ×3 IMPLANT
CATH CPS DIRECT XW DS2C029 (CATHETERS) ×3 IMPLANT
CATH HEX JOSEPH 2-5-2 65CM 6F (CATHETERS) ×3 IMPLANT
ICD VIGILANT DF4 G247 (ICD Generator) ×3 IMPLANT
KIT ESSENTIALS PG (KITS) ×3 IMPLANT
LEAD ACUITY X4 4674 (Lead) ×3 IMPLANT
LEAD RELIANCE IS4 AF 59 0272 (Lead) ×3 IMPLANT
PAD PRO RADIOLUCENT 2001M-C (PAD) ×3 IMPLANT
SHEATH 9.5FR PRELUDE SNAP 13 (SHEATH) ×3 IMPLANT
SHEATH 9FR PRELUDE SNAP 13 (SHEATH) ×3 IMPLANT
SLITTER UNIVERSAL DS2A003 (MISCELLANEOUS) ×3 IMPLANT
TRAY PACEMAKER INSERTION (PACKS) ×3 IMPLANT
WIRE MAILMAN 182CM (WIRE) ×3 IMPLANT

## 2019-06-27 NOTE — Progress Notes (Signed)
Physical Therapy Treatment Patient Details Name: Lance Cook MRN: [id number] DOB: 12/23/1960 Today's Date: 06/27/2019    History of Present Illness Patient is a 58 y/o male who presents with increased weight gain and edema. Admitted with acute on chronic diastolic CHF, s/p cardioversion 10/21 secondary to unstable VT x3 shocks. Plan for ICD Monday, 10/26.PMH includes CHF, MVR, AVR, A fib, AA dissection, DM, HTN, PVD, testicular CA, CVA at 58 yo.    PT Comments    Patient seen for mobility progression. Pt tolerated increased activity well with HR up to 77 bpm while ambulating. SpO2 100% on RA at rest but unable to get accurate reading while ambulating due to poor waveform.  Pt requires supervision/min guard for OOB mobility with RW and able to ambulate 300 ft this session with 2 standing rest breaks. Current plan remains appropriate.    Follow Up Recommendations  No PT follow up;Supervision for mobility/OOB     Equipment Recommendations  None recommended by PT    Recommendations for Other Services       Precautions / Restrictions Precautions Precautions: Fall Restrictions Weight Bearing Restrictions: No    Mobility  Bed Mobility               General bed mobility comments: Up in chair upon PT arrival.  Transfers Overall transfer level: Needs assistance Equipment used: Rolling walker (2 wheeled) Transfers: Sit to/from Stand Sit to Stand: Supervision         General transfer comment: supervision for safety  Ambulation/Gait Ambulation/Gait assistance: Min guard;Supervision Gait Distance (Feet): 300 Feet Assistive device: Rolling walker (2 wheeled) Gait Pattern/deviations: Step-through pattern;Decreased stride length;Wide base of support Gait velocity: varying speed   General Gait Details: cues for upright posture initially; increased cadence with distance; 2 standing rest breaks; pt denies SOB while mboilizing; unable to get SpO2 reading due to poor waveform  while ambulating   Stairs             Wheelchair Mobility    Modified Rankin (Stroke Patients Only)       Balance Overall balance assessment: Needs assistance Sitting-balance support: Feet supported;No upper extremity supported Sitting balance-Leahy Scale: Fair     Standing balance support: During functional activity Standing balance-Leahy Scale: Fair Standing balance comment: Able to stand statically wtihout UE support, does better with UE support for gait                            Cognition Arousal/Alertness: Awake/alert Behavior During Therapy: WFL for tasks assessed/performed Overall Cognitive Status: Within Functional Limits for tasks assessed                                        Exercises      General Comments General comments (skin integrity, edema, etc.): HR up to 77 bpm with mobility; SpO2 100% on RA end of session      Pertinent Vitals/Pain Pain Assessment: No/denies pain    Home Living                      Prior Function            PT Goals (current goals can now be found in the care plan section) Progress towards PT goals: Progressing toward goals    Frequency    Min 3X/week  PT Plan Current plan remains appropriate    Co-evaluation              AM-PAC PT "6 Clicks" Mobility   Outcome Measure  Help needed turning from your back to your side while in a flat bed without using bedrails?: None Help needed moving from lying on your back to sitting on the side of a flat bed without using bedrails?: None Help needed moving to and from a bed to a chair (including a wheelchair)?: None Help needed standing up from a chair using your arms (e.g., wheelchair or bedside chair)?: None Help needed to walk in hospital room?: A Little Help needed climbing 3-5 steps with a railing? : A Little 6 Click Score: 22    End of Session Equipment Utilized During Treatment: Gait belt Activity Tolerance:  Patient tolerated treatment well Patient left: in chair;with call bell/phone within reach Nurse Communication: Mobility status PT Visit Diagnosis: Difficulty in walking, not elsewhere classified (R26.2);Unsteadiness on feet (R26.81);Muscle weakness (generalized) (M62.81)     Time: [id number] PT Time Calculation (min) (ACUTE ONLY): 28 min  Charges:  $Gait Training: 23-37 mins                     Earney Navy, PTA Acute Rehabilitation Services Pager: [phone] Office: [phone]     Darliss Cheney 06/27/2019, 12:50 PM

## 2019-06-27 NOTE — Progress Notes (Addendum)
[place] for warfarin + heparin Indication: Mechanical AVR [name] 1988), mech Mitral valve 5/16, Afib (CHADS2VASc = 4)  Allergies  Allergen Reactions  . Feraheme [Ferumoxytol] Shortness Of Breath    SOB/Tachycardia  . Penicillins Hives and Rash    Has patient had a PCN reaction causing immediate rash, facial/tongue/throat swelling, SOB or lightheadedness with hypotension: Yes Has patient had a PCN reaction causing severe rash involving mucus membranes or skin necrosis: Yes Has patient had a PCN reaction that required hospitalization unsure Has patient had a PCN reaction occurring within the last 10 years: 2010-2012 If all of the above answers are "NO", then may proceed with Cephalosporin use.  [name] Other (See Comments)    CDIFF, when taking oral tablets   . Tape Other (See Comments) and Itching    Skin irritation    Patient Measurements: Height: 5' (152.4 cm) Weight: 182 lb 8.7 oz (82.8 kg) IBW/kg (Calculated) : 50 Heparin dosing weight: 69 kg  Vital Signs: Temp: 98.4 F (36.9 C) (10/26 0735) Temp Source: Oral (10/26 0735) BP: 104/61 (10/26 0735) Pulse Rate: 73 (10/26 0735)  Labs: Recent Labs    06/25/19 0422 06/26/19 0435 06/27/19 0334  HGB 10.1* 9.4* 9.5*  HCT 33.6* 31.3* 31.6*  PLT 205 201 195  LABPROT 32.5* 29.1* 24.4*  INR 3.2* 2.8* 2.2*  CREATININE 1.22 1.27* 1.33*    Estimated Creatinine Clearance: 54.7 mL/min (A) (by C-G formula based on SCr of 1.33 mg/dL (H)).   Assessment: 58 y.o. 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   Home dose is 5mg  daily, last taken 10/19. INR fluctuating outpatient - was therapeutic on 10/13 at 2.6 but INR 1.2 on 10/6 and 2.6 on 9/29.   INR up 4.9 10/23, now down to 2.2 (held x 2d, and resumed 10/24). Started on IV amiodarone 10/21 for VT, will need to watch INR closely.    To cath lab today for ICD implant. Pharmacy consulted to start heparin while INR subtherapeutic  Goal of Therapy:  INR 2.5 - 3.5  Heparin level 0.3-0.7 Monitor platelets by anticoagulation protocol: Yes   Plan:  Give warfarin 4 mg tonight Start IV heparin at 1000 units/hr Check 6 hour heparin level Daily INR, CBC, heparin level Monitor for bleeding   [name], PharmD PGY2 Cardiology Pharmacy Resident Phone [phone] 06/27/2019       9:02 AM  Please check [email or web address] for unit-specific pharmacist phone numbers

## 2019-06-27 NOTE — Progress Notes (Signed)
Una boots cut off, with wait until 1200 and apply Ted hose

## 2019-06-27 NOTE — Progress Notes (Signed)
Patient ID: Lance Cook, male   DOB: 25-Apr-1961, 58 y.o.   MRN: [id number]     Advanced Heart Failure Rounding Note  PCP-Cardiologist: [name], MD   Subjective:    Remains on amiodarone.   On lasix gtt at 15 mg/hr. Co-ox 64%. Weight down.  Na up to 129. Creatinine fairly stable 1.33.  CVP 12 today.   Feels ok. Denies CP or SOB. No orthopnea or PND. No further VT  WBC 21K -> 17k -> 17.7. [place]. UA ok  Afebrile   Echo: EF 30-35% with global HK, normal RV size and systolic function, mechanical MV mean gradient 5.7 and mild MR, mechanical AoV mean gradient 12, dilated IVC.   PYP scan: Negative.    Objective:   Weight Range: 82.8 kg Body mass index is 35.65 kg/m.   Vital Signs:   Temp:  [97.9 F (36.6 C)-98.4 F (36.9 C)] 98.4 F (36.9 C) (10/26 0735) Pulse Rate:  [66-86] 73 (10/26 0735) Resp:  [16-26] 16 (10/26 0735) BP: (85-111)/(56-65) 104/61 (10/26 0735) SpO2:  [93 %-100 %] 95 % (10/26 0735) Weight:  [82.8 kg] 82.8 kg (10/26 0500) Last BM Date: 06/26/19  Weight change: Filed Weights   06/25/19 0252 06/26/19 0305 06/27/19 0500  Weight: 86.3 kg 84.9 kg 82.8 kg    Intake/Output:   Intake/Output Summary (Last 24 hours) at 06/27/2019 0814 Last data filed at 06/27/2019 0500 Gross per 24 hour  Intake 1619.93 ml  Output 2900 ml  Net -1280.07 ml      Physical Exam    General: NAD Neck: JVP 12, no thyromegaly or thyroid nodule.  Lungs: Decreased BS at bases.  CV: Nondisplaced PMI.  Heart irregular with mechanical S1/S2, no S3/S4, 2/6 SEM RUSB.  Legs wrapped.   Abdomen: Soft, nontender, no hepatosplenomegaly, no distention.  Skin: Intact without lesions or rashes.  Neurologic: Alert and oriented x 3.  Psych: Normal affect. Extremities: No clubbing or cyanosis.  HEENT: Normal.    Telemetry   Atrial fibrillation in 80-90s (personally reviewed).   Labs    CBC Recent Labs    06/26/19 0435 06/27/19 0334  WBC 17.1* 17.7*  HGB 9.4* 9.5*  HCT  31.3* 31.6*  MCV 74.3* 74.5*  PLT 201 0000000   Basic Metabolic Panel Recent Labs    06/26/19 0435 06/27/19 0334  NA 127* 129*  K 3.4* 3.9  CL 83* 87*  CO2 33* 33*  GLUCOSE 82 85  BUN 44* 46*  CREATININE 1.27* 1.33*  CALCIUM 7.8* 8.0*  MG 2.1 2.0   Liver Function Tests No results for input(s): AST, ALT, ALKPHOS, BILITOT, PROT, ALBUMIN in the last 72 hours. No results for input(s): LIPASE, AMYLASE in the last 72 hours. Cardiac Enzymes No results for input(s): CKTOTAL, CKMB, CKMBINDEX, TROPONINI in the last 72 hours.  BNP: BNP (last 3 results) Recent Labs    04/19/19 1316 04/25/19 1107 06/21/19 1752  BNP 195.6* 194.9* 381.5*    ProBNP (last 3 results) No results for input(s): PROBNP in the last 8760 hours.   D-Dimer No results for input(s): DDIMER in the last 72 hours. Hemoglobin A1C No results for input(s): HGBA1C in the last 72 hours. Fasting Lipid Panel No results for input(s): CHOL, HDL, LDLCALC, TRIG, CHOLHDL, LDLDIRECT in the last 72 hours. Thyroid Function Tests No results for input(s): TSH, T4TOTAL, T3FREE, THYROIDAB in the last 72 hours.  Invalid input(s): FREET3  Other results:   Imaging    No results found.   Medications:  Scheduled Medications: . allopurinol  300 mg Oral Daily  . aspirin  81 mg Oral Daily  . Chlorhexidine Gluconate Cloth  6 each Topical Daily  . digoxin  0.125 mg Oral Daily  . furosemide  40 mg Intravenous Once  . gabapentin  300 mg Oral TID  . Gerhardt's butt cream   Topical TID  . insulin aspart  0-15 Units Subcutaneous TID WC  . insulin aspart  0-5 Units Subcutaneous QHS  . magnesium oxide  400 mg Oral TID  . metolazone  2.5 mg Oral Once  . metoprolol succinate  12.5 mg Oral BID  . midodrine  10 mg Oral TID WC  . mupirocin ointment  1 application Topical TID  . potassium chloride  40 mEq Oral BID  . potassium chloride  40 mEq Oral BID WC  . sodium chloride flush  10-40 mL Intracatheter Q12H  . sodium chloride  flush  3 mL Intravenous Once  . spironolactone  25 mg Oral Daily  . traZODone  100 mg Oral QHS  . Warfarin - Pharmacist Dosing Inpatient   Does not apply q1800    Infusions: . amiodarone 30 mg/hr (06/26/19 2348)  . furosemide (LASIX) infusion 10 mg/hr (06/27/19 0500)    PRN Medications: acetaminophen, ALPRAZolam, nitroGLYCERIN, ondansetron (ZOFRAN) IV, oxyCODONE, phenol, sodium chloride flush, zolpidem   Assessment/Plan   1. 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  He has permanent atrial fibrillation. Dysfunction of his mechanical valves does not appear to explain his worsening CHF this year. 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 [place] 04/07/19 showed mildly elevated filling pressures.He was doing well until earlier this month, then has developed progressive volume overload and was admitted.  Echo done this admission after VT arrest with DCCV x 3 showed EF 30-35% with global HK, normal RV size and systolic function, mechanical MV mean gradient 5.7 and mild MR, mechanical AoV mean gradient 12, dilated IVC.  It is possible fall in EF is related to stunning from VT arrest. HS-TnI was not significantly elevated.  Remains on lasix gtt at 15 mg/hr, diuresed better yesterday with CVP 12 today and co-ox 64%.  Of note, PYP scan showed no evidence for ATTR amyloidosis. Valves sounds remain crisp on exam and gradients stable on echo.   - Continue lasix 15 mg/hr today. Supp K - Give metolazone 2.5 mg x 1 today -Continue spironolactone at 25 mg daily. - He is on midodrine 10 mg tid currently to maintain BP, will continue for now. SBP 90s-100s.  - Continue digoxin 0.125 daily.  - Plan AV nodal ablation and CRT-D today. 2. Valvular heart disease: Patient has  mechanical mitral and aortic valves. TEE in 8/20 showed mechanical MV with mean gradient 5 mmHg, mild peri-valvular regurgitation. The aortic valve appeared to open reasonably well but mean gradient was 23 mmHg, ?component of mild patient-prosthesis mismatch. Echo this admission showed stable gradients (lower for aortic valve).  - Continue ASA 81 and warfarin with INR goal 2.5-3.5 => INR 2.2 this morning so will start heparin gtt until he goes to cath lab. 3. Atrial fibrillation: Chronic. Long-standing atrial fibrillation with severely dilated atria, unlikely to have a successful cardioversion. - Rate control with amiodarone gtt post-arrest.  Can switch to po after AVN ablation.  -Continue Toprol XL at current dose.  -  Pending AV nodal ablation and CRT-D today.  4. VT: Patient with VT this admission, rate 200s, monomorphic.  He has not had episodes of palpitations or dizziness at home prior to admission.  He did not lose consciousness or require CPR.  Cardioversion x 3 failed, he converted back to atrial fibrillation after amiodarone boluses and remains in atrial fibrillation currently.  No chest pain and HS-TnI not significantly elevated, think ACS is unlikely. He was hypokalemic.  Cath in 4/16 with no significant disease. No further VT overnight - Continue amiodarone gtt for now, to po after AVN ablation.  - Keep K> 4.0 Mg > 2.0  - Pending AVN ablation and CRT-D today. 5. CAD: Last cath in 4/16 with patent native coronaries, probably occluded SVG-RCA. Doubt CAD as cause of his decompensation. No chest pain with minimally elevated HS-TnI. Would not repeat coronary angio at this time.  6. Type 2 diabetes: SSI.  7. OSA: CPAP. 8. Anemia, iron-deficient: received feraheme 02/22/2019 and again on 04/05/19 but had severe reaction to feraheme.Placed on allergy list. 9. Hyponatremia: Sodium improved to 129.  Fluid restrict and follow closely.  10. Hypokalemia - Supplement.  11. ID: WBCs elevated at  17, no fever.  UA negative. Bcx NGTD. - follow  12. Deconditioning: Mobilize, PT.     Length of Stay: 6  Loralie Champagne, MD  06/27/2019, 8:14 AM  Advanced Heart Failure Team Pager [phone] (M-F; 7a - 4p)  Please contact [facility] for night-coverage after hours (4p -7a ) and weekends on [email or web address]

## 2019-06-27 NOTE — Progress Notes (Addendum)
Electrophysiology Rounding Note  Patient Name: Lance Cook Date of Encounter: 06/27/2019  Primary Cardiologist: [name] Heart Failure: [name] Electrophysiologist: [name]   Subjective   The patient is doing ok today.  At this time, the patient denies chest pain, shortness of breath, or any new concerns.  Inpatient Medications    Scheduled Meds: . allopurinol  300 mg Oral Daily  . aspirin  81 mg Oral Daily  . Chlorhexidine Gluconate Cloth  6 each Topical Daily  . digoxin  0.125 mg Oral Daily  . furosemide  40 mg Intravenous Once  . gabapentin  300 mg Oral TID  . Gerhardt's butt cream   Topical TID  . insulin aspart  0-15 Units Subcutaneous TID WC  . insulin aspart  0-5 Units Subcutaneous QHS  . magnesium oxide  400 mg Oral TID  . metoprolol succinate  12.5 mg Oral BID  . midodrine  10 mg Oral TID WC  . mupirocin ointment  1 application Topical TID  . potassium chloride  40 mEq Oral BID  . potassium chloride  40 mEq Oral BID WC  . sodium chloride flush  10-40 mL Intracatheter Q12H  . sodium chloride flush  3 mL Intravenous Once  . spironolactone  25 mg Oral Daily  . traZODone  100 mg Oral QHS  . Warfarin - Pharmacist Dosing Inpatient   Does not apply q1800   Continuous Infusions: . amiodarone 30 mg/hr (06/26/19 2348)  . furosemide (LASIX) infusion 10 mg/hr (06/27/19 0500)   PRN Meds: acetaminophen, ALPRAZolam, nitroGLYCERIN, ondansetron (ZOFRAN) IV, oxyCODONE, phenol, sodium chloride flush, zolpidem   Vital Signs    Vitals:   06/27/19 0200 06/27/19 0300 06/27/19 0500 06/27/19 0735  BP:  (!) 93/58  104/61  Pulse: 66 68  73  Resp: 18 17  16   Temp:  98 F (36.7 C)  98.4 F (36.9 C)  TempSrc:  Axillary  Oral  SpO2: 97% 99%  95%  Weight:   82.8 kg   Height:        Intake/Output Summary (Last 24 hours) at 06/27/2019 0853 Last data filed at 06/27/2019 0500 Gross per 24 hour  Intake 1619.93 ml  Output 2900 ml  Net -1280.07 ml   Filed Weights   06/25/19  0252 06/26/19 0305 06/27/19 0500  Weight: 86.3 kg 84.9 kg 82.8 kg    Physical Exam    GEN- The patient is well appearing, alert and oriented x 3 today.   Head- normocephalic, atraumatic Eyes-  Sclera clear, conjunctiva pink Ears- hearing intact Oropharynx- clear Neck- supple Lungs- Clear to ausculation bilaterally, normal work of breathing Heart- Irregular rate and rhythm  GI- soft, NT, ND, + BS Extremities- no clubbing, cyanosis, legs wrapped Skin- no rash or lesion Psych- euthymic mood, full affect Neuro- strength and sensation are intact  Labs    CBC Recent Labs    06/26/19 0435 06/27/19 0334  WBC 17.1* 17.7*  HGB 9.4* 9.5*  HCT 31.3* 31.6*  MCV 74.3* 74.5*  PLT 201 0000000   Basic Metabolic Panel Recent Labs    06/26/19 0435 06/27/19 0334  NA 127* 129*  K 3.4* 3.9  CL 83* 87*  CO2 33* 33*  GLUCOSE 82 85  BUN 44* 46*  CREATININE 1.27* 1.33*  CALCIUM 7.8* 8.0*  MG 2.1 2.0     Telemetry    AF (personally reviewed)  Radiology    No results found.    Assessment & Plan    1.  VT Plan ICD  implant today for secondary prevention Continue amiodarone Keep K>3.9, Mg >1.8 No driving x6 months  2.  Chronic systolic heart failure Plan CRTD with depressed EF Risks, benefits reviewed with patient by Dr [name]. He does not have questions this morning Plan to proceed later today  3.  Permanent atrial fibrillation V rates controlled Continue Warfarin for CHADS2VASC of 3  For questions or updates, please contact [facility] Please consult [email or web address] for contact info under Cardiology/STEMI.  Signed, Chanetta Marshall, NP  06/27/2019, 8:53 AM   EP Attending  Patient seen and examined. Agree with above. The patient is stable for ICD insertion later today.  Mikle Bosworth.D.

## 2019-06-27 NOTE — Care Management Important Message (Signed)
Important Message  Patient Details  Name: Lance Cook MRN: [id number] Date of Birth: Jun 16, 1961   Medicare Important Message Given:  Yes     [name] 06/27/2019, 3:38 PM

## 2019-06-27 NOTE — Progress Notes (Signed)
Orthopedic Tech Progress Note Patient Details:  Lance Cook 23-Mar-1961 [place]:[id number]  Ortho Devices Type of Ortho Device: Unna boot Ortho Device/Splint Interventions: Adjustment, Application, Ordered       [name] 06/27/2019, 2:27 PMWent to patient's room to apply unna boots, patient went to the Cath- Lab.

## 2019-06-27 NOTE — Progress Notes (Signed)
Patient's belongings stored and jewelry sealed in bag with other possessions. Bag in patient room in closet. Patient's glasses also in patient' room at bedside.

## 2019-06-28 ENCOUNTER — Encounter (HOSPITAL_COMMUNITY): Payer: Self-pay | Admitting: Internal Medicine

## 2019-06-28 DIAGNOSIS — I5023 Acute on chronic systolic (congestive) heart failure: Secondary | ICD-10-CM | POA: Diagnosis not present

## 2019-06-28 LAB — CBC
HCT: 31.8 % — ABNORMAL LOW (ref 39.0–52.0)
Hemoglobin: 9.5 g/dL — ABNORMAL LOW (ref 13.0–17.0)
MCH: 22.2 pg — ABNORMAL LOW (ref 26.0–34.0)
MCHC: 29.9 g/dL — ABNORMAL LOW (ref 30.0–36.0)
MCV: 74.5 fL — ABNORMAL LOW (ref 80.0–100.0)
Platelets: 214 10*3/uL (ref 150–400)
RBC: 4.27 MIL/uL (ref 4.22–5.81)
RDW: 19.8 % — ABNORMAL HIGH (ref 11.5–15.5)
WBC: 12.8 10*3/uL — ABNORMAL HIGH (ref 4.0–10.5)
nRBC: 0 % (ref 0.0–0.2)

## 2019-06-28 LAB — PROTIME-INR
INR: 2.4 — ABNORMAL HIGH (ref 0.8–1.2)
Prothrombin Time: 25.6 seconds — ABNORMAL HIGH (ref 11.4–15.2)

## 2019-06-28 LAB — DIGOXIN LEVEL: Digoxin Level: 0.6 ng/mL — ABNORMAL LOW (ref 0.8–2.0)

## 2019-06-28 LAB — BASIC METABOLIC PANEL
Anion gap: 11 (ref 5–15)
BUN: 46 mg/dL — ABNORMAL HIGH (ref 6–20)
CO2: 31 mmol/L (ref 22–32)
Calcium: 8 mg/dL — ABNORMAL LOW (ref 8.9–10.3)
Chloride: 86 mmol/L — ABNORMAL LOW (ref 98–111)
Creatinine, Ser: 1.39 mg/dL — ABNORMAL HIGH (ref 0.61–1.24)
GFR calc Af Amer: >60 mL/min (ref >60)
GFR calc non Af Amer: 56 mL/min — ABNORMAL LOW (ref >60)
Glucose, Bld: 109 mg/dL — ABNORMAL HIGH (ref 70–99)
Potassium: 3.1 mmol/L — ABNORMAL LOW (ref 3.5–5.1)
Sodium: 128 mmol/L — ABNORMAL LOW (ref 135–145)

## 2019-06-28 LAB — GLUCOSE, CAPILLARY
Glucose-Capillary: 89 mg/dL (ref 70–99)
Glucose-Capillary: 86 mg/dL (ref 70–99)
Glucose-Capillary: 78 mg/dL (ref 70–99)
Glucose-Capillary: 78 mg/dL (ref 70–99)

## 2019-06-28 LAB — HAPTOGLOBIN: Haptoglobin: 12 mg/dL — ABNORMAL LOW (ref 29–370)

## 2019-06-28 MED ORDER — METOLAZONE 2.5 MG PO TABS
2.5000 mg | ORAL_TABLET | Freq: Once | ORAL | Status: AC
  Administered 2019-06-28: 09:00:00 2.5 mg via ORAL
  Filled 2019-06-28: qty 1

## 2019-06-28 MED ORDER — SODIUM CHLORIDE 0.9 % IV SOLN: INTRAVENOUS | Status: DC

## 2019-06-28 MED ORDER — AMIODARONE HCL 200 MG PO TABS
200.0000 mg | ORAL_TABLET | Freq: Two times a day (BID) | ORAL | Status: DC
  Administered 2019-06-28 – 2019-07-01 (×7): 200 mg via ORAL
  Filled 2019-06-28 (×7): qty 1

## 2019-06-28 MED ORDER — ONDANSETRON HCL 4 MG/2ML IJ SOLN: 4.0000 mg | Freq: Four times a day (QID) | INTRAMUSCULAR | Status: DC | PRN

## 2019-06-28 MED ORDER — VANCOMYCIN HCL IN DEXTROSE 1-5 GM/200ML-% IV SOLN
1000.0000 mg | Freq: Two times a day (BID) | INTRAVENOUS | Status: AC
  Administered 2019-06-28: 1000 mg via INTRAVENOUS
  Filled 2019-06-28: qty 200

## 2019-06-28 MED ORDER — POTASSIUM CHLORIDE CRYS ER 20 MEQ PO TBCR
40.0000 meq | EXTENDED_RELEASE_TABLET | ORAL | Status: AC
  Administered 2019-06-28 (×2): 40 meq via ORAL
  Filled 2019-06-28: qty 2

## 2019-06-28 MED ORDER — ACETAMINOPHEN 325 MG PO TABS: 325.0000 mg | ORAL_TABLET | ORAL | Status: DC | PRN

## 2019-06-28 MED ORDER — WARFARIN SODIUM 2.5 MG PO TABS
2.5000 mg | ORAL_TABLET | Freq: Once | ORAL | Status: AC
  Administered 2019-06-28: 17:00:00 2.5 mg via ORAL
  Filled 2019-06-28: qty 1

## 2019-06-28 MED ORDER — SODIUM CHLORIDE 0.9 % IV SOLN
INTRAVENOUS | Status: DC | PRN
  Administered 2019-06-28: 250 mL via INTRAVENOUS

## 2019-06-28 MED ORDER — WARFARIN SODIUM 4 MG PO TABS: 4.0000 mg | ORAL_TABLET | Freq: Once | ORAL | Status: DC

## 2019-06-28 MED FILL — Fentanyl Citrate Preservative Free (PF) Inj 100 MCG/2ML: INTRAMUSCULAR | Qty: 2 | Status: AC

## 2019-06-28 MED FILL — Lidocaine HCl Local Inj 1%: INTRAMUSCULAR | Qty: 60 | Status: AC

## 2019-06-28 NOTE — Progress Notes (Addendum)
Electrophysiology Rounding Note  Patient Name: Lance Cook Date of Encounter: 06/28/2019  Primary Cardiologist: [name] Electrophysiologist: [name]   Subjective   The patient is doing ok today.  At this time, the patient denies chest pain, shortness of breath, or any new concerns.  He did have diaphragmatic stim overnight, LV lead turned off.   Inpatient Medications    Scheduled Meds: . allopurinol  300 mg Oral Daily  . aspirin  81 mg Oral Daily  . Chlorhexidine Gluconate Cloth  6 each Topical Daily  . digoxin  0.125 mg Oral Daily  . gabapentin  300 mg Oral TID  . Gerhardt's butt cream   Topical TID  . insulin aspart  0-15 Units Subcutaneous TID WC  . insulin aspart  0-5 Units Subcutaneous QHS  . magnesium oxide  400 mg Oral TID  . metoprolol succinate  12.5 mg Oral BID  . midodrine  10 mg Oral TID WC  . mupirocin ointment  1 application Topical TID  . potassium chloride  40 mEq Oral BID  . sodium chloride flush  10-40 mL Intracatheter Q12H  . sodium chloride flush  3 mL Intravenous Once  . spironolactone  25 mg Oral Daily  . traZODone  100 mg Oral QHS  . Warfarin - Pharmacist Dosing Inpatient   Does not apply q1800   Continuous Infusions: . sodium chloride    . sodium chloride    . sodium chloride 10 mL/hr at 06/28/19 0610  . amiodarone 30 mg/hr (06/28/19 0610)  . furosemide (LASIX) infusion 15 mg/hr (06/28/19 0610)   PRN Meds: sodium chloride, acetaminophen, ALPRAZolam, nitroGLYCERIN, ondansetron (ZOFRAN) IV, oxyCODONE, phenol, sodium chloride flush, zolpidem   Vital Signs    Vitals:   06/27/19 2351 06/28/19 0358 06/28/19 0405 06/28/19 0739  BP: 98/61 101/67    Pulse: 69 73    Resp: 12 18    Temp: 98 F (36.7 C) 98.1 F (36.7 C)  98 F (36.7 C)  TempSrc: Oral Oral  Oral  SpO2: 92% 94%    Weight:   83.7 kg   Height:        Intake/Output Summary (Last 24 hours) at 06/28/2019 0824 Last data filed at 06/28/2019 0610 Gross per 24 hour  Intake 1882.66 ml   Output 5425 ml  Net -3542.34 ml   Filed Weights   06/26/19 0305 06/27/19 0500 06/28/19 0405  Weight: 84.9 kg 82.8 kg 83.7 kg    Physical Exam    GEN- The patient is well appearing, alert and oriented x 3 today.   Head- normocephalic, atraumatic Eyes-  Sclera clear, conjunctiva pink Ears- hearing intact Oropharynx- clear Neck- supple Lungs- Clear to ausculation bilaterally, normal work of breathing Heart- Irregular rate and rhythm  GI- soft, NT, ND, + BS Extremities- no clubbing, cyanosis, or edema Skin- no rash or lesion Psych- euthymic mood, full affect Neuro- strength and sensation are intact  Labs    CBC Recent Labs    06/27/19 0334 06/28/19 0355  WBC 17.7* 12.8*  HGB 9.5* 9.5*  HCT 31.6* 31.8*  MCV 74.5* 74.5*  PLT 195 [id number]   Basic Metabolic Panel Recent Labs    06/26/19 0435 06/27/19 0334 06/28/19 0355  NA 127* 129* 128*  K 3.4* 3.9 3.1*  CL 83* 87* 86*  CO2 33* 33* 31  GLUCOSE 82 85 109*  BUN 44* 46* 46*  CREATININE 1.27* 1.33* 1.39*  CALCIUM 7.8* 8.0* 8.0*  MG 2.1 2.0  --     Telemetry  AF with CVR (personally reviewed)  Radiology    No results found.  Assessment & Plan    1.  VT S/p ICD yesterday Normal device function, LV lead off for diaphragmatic stim CXR pending this morning No driving x 6 months Keep K>3.9, Mg >1.8  2.  Chronic systolic heart failure Per HF team LV lead placement difficult yesterday Can re-evaluate in office different vectors for LV pacing without diaphragmatic stim  3.  Permanent AF Would like to keep INR <3 for next week while pacemaker pocket healing No Heparin INR 2.4 today  Electrophysiology team to see as needed while here. Please call with questions.   For questions or updates, please contact [place] Please consult [email or web address] for contact info under Cardiology/STEMI.  Signed, Chanetta Marshall, NP  06/28/2019, 8:24 AM   EP Attending  Patient seen and examined. Agree with the  findings as noted above. The patient is s/p BIV ICD Insertion. BIV ICD interrogation has been carried out under my direct supervision.  His LV lead has resulted in diaphragmatic stimulation. I suspect it moved minimally. We will hold off on revision for now and in the future could consider Left bundle pacing if needed.   Cahterine Heinzel,M.D.  Carleene Overlie

## 2019-06-28 NOTE — Progress Notes (Addendum)
Lance Cook for warfarin Indication: Mechanical AVR Lance Cook 1988), mech Mitral valve 5/16, Afib (CHADS2VASc = 4)  Allergies  Allergen Reactions  . Feraheme [Ferumoxytol] Shortness Of Breath    SOB/Tachycardia  . Penicillins Hives and Rash    Has patient had a PCN reaction causing immediate rash, facial/tongue/throat swelling, SOB or lightheadedness with hypotension: Yes Has patient had a PCN reaction causing severe rash involving mucus membranes or skin necrosis: Yes Has patient had a PCN reaction that required hospitalization unsure Has patient had a PCN reaction occurring within the last 10 years: 2010-2012 If all of the above answers are "NO", then may proceed with Cephalosporin use.  Lance Cook Other (See Comments)    CDIFF, when taking oral tablets   . Tape Other (See Comments) and Itching    Skin irritation    Patient Measurements: Height: 5' (152.4 cm) Weight: 184 lb 8 oz (83.7 kg) IBW/kg (Calculated) : 50 Heparin dosing weight: 69 kg  Vital Signs: Temp: 98 F (36.7 C) (10/27 0739) Temp Source: Oral (10/27 0739) BP: 111/57 (10/27 0739) Pulse Rate: 75 (10/27 0800)  Labs: Recent Labs    06/26/19 0435 06/27/19 0334 06/28/19 0355  HGB 9.4* 9.5* 9.5*  HCT 31.3* 31.6* 31.8*  PLT 201 195 214  LABPROT 29.1* 24.4* 25.6*  INR 2.8* 2.2* 2.4*  CREATININE 1.27* 1.33* 1.39*    Estimated Creatinine Clearance: 52.7 mL/min (A) (by C-G formula based on SCr of 1.39 mg/dL (H)).   Assessment: 58 y.o. 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   Home dose is 5mg  daily, last taken 10/19. INR fluctuating outpatient - was therapeutic on 10/13 at 2.6 but INR 1.2 on 10/6 and 2.6 on 9/29.   INR up 4.9 10/23, now 2.4 (held x 2d, and resumed 10/24). Started on IV amiodarone 10/21 for VT, transitioned to PO today, will need to watch INR  closely.   ICD implant and AV ablation done 10/26. No further heparin post device per EP. Note goal INR <3 for the next week while pacemaker pocket healing.  Goal of Therapy:  INR 2.5 - 3.5  Monitor platelets by anticoagulation protocol: Yes   Plan:  Give warfarin 2.5 mg tonight Daily INR Monitor for bleeding   Lance Cook, PharmD PGY2 Cardiology Pharmacy Resident Phone [phone] 06/28/2019       10:52 AM  Please check [email or web address] for unit-specific pharmacist phone numbers

## 2019-06-28 NOTE — Progress Notes (Signed)
CARDIAC REHAB PHASE I   PRE:  Rate/Rhythm: 72 afib  BP:  Supine:   Sitting: 116/65  Standing:     SaO2: 97%RA  MODE:  Ambulation: 300 ft   POST:  Rate/Rhythm: 87 afib  BP:  Supine:   Sitting: 119/60  Standing:    SaO2: 98-100%RA  Checked in hallway several times 1410-1450 Pt receptive to walking. Pt walked 300 ft on RA with rolling walker. Encouraged pt to keep left arm close to body and not put pressure on. Pt requested that I check his sats frequently in hallway which I did. Sats good on RA. Pt stopped a few times to rest. To recliner after walk. Pt stated he weighs daily but has had some indiscretion with salt when he eats seafood. Pt seen by CRP in August and CHF ed done then.   Graylon Good, RN BSN  06/28/2019 2:44 PM

## 2019-06-28 NOTE — Progress Notes (Signed)
Patient ID: Lance Cook, male   DOB: Feb 03, 1961, 58 y.o.   MRN: [id number]     Advanced Heart Failure Rounding Note  PCP-Cardiologist: [name], MD   Subjective:    Remains on amiodarone gtt, HR in 70s.    [employer] BiV ICD placed yesterday but patient had diaphragmatic stimulation, LV lead turned off.    On lasix gtt at 15 mg/hr. Good diuresis with stable creatinine 1.39.  CVP 13 today.   Feels ok. Denies CP or SOB. No orthopnea or PND. No further VT  WBC 21K -> 17k -> 17.7 -> 12.8. [place].  Afebrile   Echo: EF 30-35% with global HK, normal RV size and systolic function, mechanical MV mean gradient 5.7 and mild MR, mechanical AoV mean gradient 12, dilated IVC.   PYP scan: Negative.    Objective:   Weight Range: 83.7 kg Body mass index is 36.03 kg/m.   Vital Signs:   Temp:  [97.3 F (36.3 C)-98.1 F (36.7 C)] 98 F (36.7 C) (10/27 0739) Pulse Rate:  [63-153] 73 (10/27 0358) Resp:  [12-56] 18 (10/27 0358) BP: (76-107)/(37-69) 101/67 (10/27 0358) SpO2:  [10 %-100 %] 94 % (10/27 0358) Weight:  [83.7 kg] 83.7 kg (10/27 0405) Last BM Date: 06/26/19  Weight change: Filed Weights   06/26/19 0305 06/27/19 0500 06/28/19 0405  Weight: 84.9 kg 82.8 kg 83.7 kg    Intake/Output:   Intake/Output Summary (Last 24 hours) at 06/28/2019 0835 Last data filed at 06/28/2019 0800 Gross per 24 hour  Intake 2182.66 ml  Output 6625 ml  Net -4442.34 ml      Physical Exam    General: NAD Neck: JVP 12-14, no thyromegaly or thyroid nodule.  Lungs: Clear to auscultation bilaterally with normal respiratory effort. CV: Nondisplaced PMI.  Heart irregular S1/S2, no S3/S4, no murmur.  1+ edema 1/2 to knees bilaterally.   Abdomen: Soft, nontender, no hepatosplenomegaly, no distention.  Skin: Intact without lesions or rashes.  Neurologic: Alert and oriented x 3.  Psych: Normal affect. Extremities: No clubbing or cyanosis.  HEENT: Normal.    Telemetry   Atrial  fibrillation in 70s (personally reviewed).   Labs    CBC Recent Labs    06/27/19 0334 06/28/19 0355  WBC 17.7* 12.8*  HGB 9.5* 9.5*  HCT 31.6* 31.8*  MCV 74.5* 74.5*  PLT 195 [id number]   Basic Metabolic Panel Recent Labs    06/26/19 0435 06/27/19 0334 06/28/19 0355  NA 127* 129* 128*  K 3.4* 3.9 3.1*  CL 83* 87* 86*  CO2 33* 33* 31  GLUCOSE 82 85 109*  BUN 44* 46* 46*  CREATININE 1.27* 1.33* 1.39*  CALCIUM 7.8* 8.0* 8.0*  MG 2.1 2.0  --    Liver Function Tests No results for input(s): AST, ALT, ALKPHOS, BILITOT, PROT, ALBUMIN in the last 72 hours. No results for input(s): LIPASE, AMYLASE in the last 72 hours. Cardiac Enzymes No results for input(s): CKTOTAL, CKMB, CKMBINDEX, TROPONINI in the last 72 hours.  BNP: BNP (last 3 results) Recent Labs    04/19/19 1316 04/25/19 1107 06/21/19 1752  BNP 195.6* 194.9* 381.5*    ProBNP (last 3 results) No results for input(s): PROBNP in the last 8760 hours.   D-Dimer No results for input(s): DDIMER in the last 72 hours. Hemoglobin A1C No results for input(s): HGBA1C in the last 72 hours. Fasting Lipid Panel No results for input(s): CHOL, HDL, LDLCALC, TRIG, CHOLHDL, LDLDIRECT in the last 72 hours. Thyroid Function Tests  No results for input(s): TSH, T4TOTAL, T3FREE, THYROIDAB in the last 72 hours.  Invalid input(s): FREET3  Other results:   Imaging    No results found.   Medications:     Scheduled Medications: . allopurinol  300 mg Oral Daily  . Chlorhexidine Gluconate Cloth  6 each Topical Daily  . digoxin  0.125 mg Oral Daily  . gabapentin  300 mg Oral TID  . Gerhardt's butt cream   Topical TID  . insulin aspart  0-15 Units Subcutaneous TID WC  . insulin aspart  0-5 Units Subcutaneous QHS  . magnesium oxide  400 mg Oral TID  . metoprolol succinate  12.5 mg Oral BID  . midodrine  10 mg Oral TID WC  . mupirocin ointment  1 application Topical TID  . potassium chloride  40 mEq Oral BID  . sodium  chloride flush  10-40 mL Intracatheter Q12H  . sodium chloride flush  3 mL Intravenous Once  . spironolactone  25 mg Oral Daily  . traZODone  100 mg Oral QHS  . Warfarin - Pharmacist Dosing Inpatient   Does not apply q1800    Infusions: . sodium chloride    . sodium chloride    . sodium chloride 10 mL/hr at 06/28/19 0610  . amiodarone 30 mg/hr (06/28/19 0610)  . furosemide (LASIX) infusion 15 mg/hr (06/28/19 0610)    PRN Medications: sodium chloride, acetaminophen, ALPRAZolam, nitroGLYCERIN, ondansetron (ZOFRAN) IV, oxyCODONE, phenol, sodium chloride flush, zolpidem   Assessment/Plan   1. 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  He has permanent atrial fibrillation. Dysfunction of his mechanical valves does not appear to explain his worsening CHF this year. 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 [place] 04/07/19 showed mildly elevated filling pressures.He was doing well until earlier this month, then has developed progressive volume overload and was admitted.  Echo done this admission after VT arrest with DCCV x 3 showed EF 30-35% with global HK, normal RV size and systolic function, mechanical MV mean gradient 5.7 and mild MR, mechanical AoV mean gradient 12, dilated IVC.  It is possible fall in EF is related to stunning from VT arrest. HS-TnI was not significantly elevated.  He is s/p Chemical engineer CRT-D placement on 10/26, LV lead is off.  Remains on lasix gtt at 15 mg/hr, diuresed well again yesterday, now CVP 13.  Of note, PYP scan showed no evidence for ATTR amyloidosis. Valves sounds remain crisp on exam and gradients stable on echo.   - Continue lasix 15 mg/hr today. Supp K - Give metolazone 2.5 mg x 1 today -Continue spironolactone at 12.5  mg daily. - He is on midodrine 10 mg tid currently to maintain BP, will continue for now.  - Continue digoxin 0.125 daily, level ok today.  - Discussed with Chanetta Marshall, LV lead off due to diaphragmatic stimulation and will try different vectors for LV pacing when he is seen in office followup.  2. Valvular heart disease: Patient has mechanical mitral and aortic valves. TEE in 8/20 showed mechanical MV with mean gradient 5 mmHg, mild peri-valvular regurgitation. The aortic valve appeared to open reasonably well but mean gradient was 23 mmHg, ?component of mild patient-prosthesis mismatch. Echo this admission showed stable gradients (lower for aortic valve).  - Continue ASA 81 and warfarin with INR  goal 2.5-3.5 => INR 2.4 this morning, no heparin gtt with recent device. 3. Atrial fibrillation: Chronic. Long-standing atrial fibrillation with severely dilated atria, unlikely to have a successful cardioversion.  -Continue Toprol XL at current dose.  4. VT: Patient with VT this admission, rate 200s, monomorphic.  He has not had episodes of palpitations or dizziness at home prior to admission.  He did not lose consciousness or require CPR.  Cardioversion x 3 failed, he converted back to atrial fibrillation after amiodarone boluses and remains in atrial fibrillation currently.  No chest pain and HS-TnI not significantly elevated, think ACS is unlikely. He was hypokalemic.  Cath in 4/16 with no significant disease. No further VT overnight.  Now with [facility] [employer] CRT-D device.  - Stop IV amiodarone, start amiodarone 200 mg bid.  - Keep K> 4.0 Mg > 2.0  5. CAD: Last cath in 4/16 with patent native coronaries, probably occluded SVG-RCA. Doubt CAD as cause of his decompensation. No chest pain with minimally elevated HS-TnI. Would not repeat coronary angio at this time.  6. Type 2 diabetes: SSI.  7. OSA: CPAP. 8. Anemia, iron-deficient: received feraheme 02/22/2019 and again on 04/05/19 but had severe  reaction to feraheme.Placed on allergy list. 9. Hyponatremia: Sodium 128.  Fluid restrict and follow closely.  10. Hypokalemia - Supplement.  11. ID: WBCs trending down, no fever.  UA negative. Bcx NGTD. - follow  12. Deconditioning: Mobilize, PT.     Length of Stay: 7  Loralie Champagne, MD  06/28/2019, 8:35 AM  Advanced Heart Failure Team Pager [phone] (M-F; 7a - 4p)  Please contact [facility] for night-coverage after hours (4p -7a ) and weekends on [email or web address]

## 2019-06-29 ENCOUNTER — Inpatient Hospital Stay (HOSPITAL_COMMUNITY): Payer: Medicare HMO

## 2019-06-29 DIAGNOSIS — I5023 Acute on chronic systolic (congestive) heart failure: Secondary | ICD-10-CM | POA: Diagnosis not present

## 2019-06-29 LAB — CBC
HCT: 31.9 % — ABNORMAL LOW (ref 39.0–52.0)
Hemoglobin: 9.5 g/dL — ABNORMAL LOW (ref 13.0–17.0)
MCH: 22.2 pg — ABNORMAL LOW (ref 26.0–34.0)
MCHC: 29.8 g/dL — ABNORMAL LOW (ref 30.0–36.0)
MCV: 74.7 fL — ABNORMAL LOW (ref 80.0–100.0)
Platelets: 188 10*3/uL (ref 150–400)
RBC: 4.27 MIL/uL (ref 4.22–5.81)
RDW: 19.9 % — ABNORMAL HIGH (ref 11.5–15.5)
WBC: 11.5 10*3/uL — ABNORMAL HIGH (ref 4.0–10.5)
nRBC: 0 % (ref 0.0–0.2)

## 2019-06-29 LAB — GLUCOSE, CAPILLARY
Glucose-Capillary: 68 mg/dL — ABNORMAL LOW (ref 70–99)
Glucose-Capillary: 103 mg/dL — ABNORMAL HIGH (ref 70–99)
Glucose-Capillary: 64 mg/dL — ABNORMAL LOW (ref 70–99)
Glucose-Capillary: 73 mg/dL (ref 70–99)
Glucose-Capillary: 103 mg/dL — ABNORMAL HIGH (ref 70–99)
Glucose-Capillary: 80 mg/dL (ref 70–99)

## 2019-06-29 LAB — BASIC METABOLIC PANEL
Anion gap: 8 (ref 5–15)
BUN: 41 mg/dL — ABNORMAL HIGH (ref 6–20)
CO2: 34 mmol/L — ABNORMAL HIGH (ref 22–32)
Calcium: 8 mg/dL — ABNORMAL LOW (ref 8.9–10.3)
Chloride: 87 mmol/L — ABNORMAL LOW (ref 98–111)
Creatinine, Ser: 1.25 mg/dL — ABNORMAL HIGH (ref 0.61–1.24)
GFR calc Af Amer: >60 mL/min (ref >60)
GFR calc non Af Amer: >60 mL/min (ref >60)
Glucose, Bld: 78 mg/dL (ref 70–99)
Potassium: 3.2 mmol/L — ABNORMAL LOW (ref 3.5–5.1)
Sodium: 129 mmol/L — ABNORMAL LOW (ref 135–145)

## 2019-06-29 LAB — PROTIME-INR
INR: 2.1 — ABNORMAL HIGH (ref 0.8–1.2)
Prothrombin Time: 23.5 seconds — ABNORMAL HIGH (ref 11.4–15.2)

## 2019-06-29 MED ORDER — WARFARIN SODIUM 4 MG PO TABS
4.0000 mg | ORAL_TABLET | Freq: Once | ORAL | Status: AC
  Administered 2019-06-29: 17:00:00 4 mg via ORAL
  Filled 2019-06-29: qty 1

## 2019-06-29 MED ORDER — POTASSIUM CHLORIDE CRYS ER 20 MEQ PO TBCR
40.0000 meq | EXTENDED_RELEASE_TABLET | Freq: Every day | ORAL | Status: DC
  Administered 2019-06-29 – 2019-06-30 (×6): 40 meq via ORAL
  Filled 2019-06-29 (×6): qty 2

## 2019-06-29 NOTE — Progress Notes (Signed)
CARDIAC REHAB PHASE I   PRE:  Rate/Rhythm: 75 afib   BP:  Supine:   Sitting: 100/67  Standing:    SaO2: 96%RA  MODE:  Ambulation: 400 ft   POST:  Rate/Rhythm: 92 afib  BP:  Supine:   Sitting: 106/60  Standing:    SaO2: 96%RA- 94% room 0950-1032 Pt wanting to walk. Pt walked 400 ft on RA with rolling walker and minimal asst. Stopped a couple of times to rest and wanted pulse ox checked. Sats good on RA. Tolerated well. To bed after walk with call bell. PT to see later. Pt aware of FR and trying to adhere.   Graylon Good, RN BSN  06/29/2019 10:26 AM

## 2019-06-29 NOTE — Progress Notes (Addendum)
Patient ID: Lance Cook, male   DOB: 1960-09-24, 58 y.o.   MRN: [id number]     Advanced Heart Failure Rounding Note  PCP-Cardiologist: Lance Breeding, MD   Subjective:   [facility] Scientific BiV ICD placed this admit but patient had diaphragmatic stimulation, LV lead turned off.    On lasix gtt at 15 mg/hr. Brisk diuresis. CVP 11-12  Complaining of fatigue.   Objective:   Weight Range: 81.6 kg Body mass index is 35.13 kg/m.   Vital Signs:   Temp:  [97.4 F (36.3 C)-98.8 F (37.1 C)] 97.4 F (36.3 C) (10/28 0748) Pulse Rate:  [64-73] 73 (10/28 0957) Resp:  [17-20] 17 (10/28 0748) BP: (88-138)/(51-75) 100/67 (10/28 0957) SpO2:  [95 %-100 %] 96 % (10/28 0748) Weight:  [81.6 kg] 81.6 kg (10/28 0540) Last BM Date: 06/28/19  Weight change: Filed Weights   06/27/19 0500 06/28/19 0405 06/29/19 0540  Weight: 82.8 kg 83.7 kg 81.6 kg    Intake/Output:   Intake/Output Summary (Last 24 hours) at 06/29/2019 1154 Last data filed at 06/29/2019 0749 Gross per 24 hour  Intake 2164.56 ml  Output 5625 ml  Net -3460.44 ml      Physical Exam   CVP 11-12  General:  Well appearing. No resp difficulty HEENT: normal Neck: supple. JVP 11-12 . Carotids 2+ bilat; no bruits. No lymphadenopathy or thryomegaly appreciated. Cor: PMI nondisplaced. Regular rate & rhythm. No rubs, gallops or murmurs. Lungs: clear Abdomen: soft, nontender, nondistended. No hepatosplenomegaly. No bruits or masses. Good bowel sounds. Extremities: no cyanosis, clubbing, rash,R and LLE 1+  edema Neuro: alert & orientedx3, cranial nerves grossly intact. moves all 4 extremities w/o difficulty. Affect flat    Telemetry   A fib 70 personally reviewed.   Labs    CBC Recent Labs    06/28/19 0355 06/29/19 0409  WBC 12.8* 11.5*  HGB 9.5* 9.5*  HCT 31.8* 31.9*  MCV 74.5* 74.7*  PLT 214 0000000   Basic Metabolic Panel Recent Labs    06/27/19 0334 06/28/19 0355 06/29/19 0409  NA 129* 128* 129*  K 3.9 3.1* 3.2*   CL 87* 86* 87*  CO2 33* 31 34*  GLUCOSE 85 109* 78  BUN 46* 46* 41*  CREATININE 1.33* 1.39* 1.25*  CALCIUM 8.0* 8.0* 8.0*  MG 2.0  --   --    Liver Function Tests No results for input(s): AST, ALT, ALKPHOS, BILITOT, PROT, ALBUMIN in the last 72 hours. No results for input(s): LIPASE, AMYLASE in the last 72 hours. Cardiac Enzymes No results for input(s): CKTOTAL, CKMB, CKMBINDEX, TROPONINI in the last 72 hours.  BNP: BNP (last 3 results) Recent Labs    04/19/19 1316 04/25/19 1107 06/21/19 1752  BNP 195.6* 194.9* 381.5*    ProBNP (last 3 results) No results for input(s): PROBNP in the last 8760 hours.   D-Dimer No results for input(s): DDIMER in the last 72 hours. Hemoglobin A1C No results for input(s): HGBA1C in the last 72 hours. Fasting Lipid Panel No results for input(s): CHOL, HDL, LDLCALC, TRIG, CHOLHDL, LDLDIRECT in the last 72 hours. Thyroid Function Tests No results for input(s): TSH, T4TOTAL, T3FREE, THYROIDAB in the last 72 hours.  Invalid input(s): FREET3  Other results:   Imaging    Dg Chest 2 View  Result Date: 06/29/2019 CLINICAL DATA:  ICD placement EXAM: CHEST - 2 VIEW COMPARISON:  Portable exam 0622 hours compared to 06/22/2019 FINDINGS: LEFT subclavian ICD with leads projecting over RIGHT RIGHT ventricle and coronary sinus. RIGHT  arm PICC line tip projects over RIGHT atrium; consider withdrawal 3 cm to place tip above cavoatrial junction. Enlargement of cardiac silhouette post MVR. Slight pulmonary vascular congestion. Enlarged central pulmonary arteries question pulmonary arterial hypertension. Persistent RIGHT basilar atelectasis and small pleural effusion. No pneumothorax. IMPRESSION: Enlargement of cardiac silhouette with pulmonary vascular congestion and suspect pulmonary arterial hypertension. Persistent small RIGHT pleural effusion and basilar atelectasis. Electronically Signed   By: Lavonia Dana M.D.   On: 06/29/2019 07:40     Medications:      Scheduled Medications:  allopurinol  300 mg Oral Daily   amiodarone  200 mg Oral BID   Chlorhexidine Gluconate Cloth  6 each Topical Daily   digoxin  0.125 mg Oral Daily   gabapentin  300 mg Oral TID   Gerhardt's butt cream   Topical TID   insulin aspart  0-15 Units Subcutaneous TID WC   insulin aspart  0-5 Units Subcutaneous QHS   magnesium oxide  400 mg Oral TID   metoprolol succinate  12.5 mg Oral BID   midodrine  10 mg Oral TID WC   mupirocin ointment  1 application Topical TID   potassium chloride  40 mEq Oral 5 X Daily   sodium chloride flush  10-40 mL Intracatheter Q12H   sodium chloride flush  3 mL Intravenous Once   spironolactone  25 mg Oral Daily   traZODone  100 mg Oral QHS   warfarin  4 mg Oral ONCE-1800   Warfarin - Pharmacist Dosing Inpatient   Does not apply q1800    Infusions:  sodium chloride     sodium chloride     sodium chloride Stopped (06/28/19 1038)   furosemide (LASIX) infusion 15 mg/hr (06/29/19 0618)    PRN Medications: sodium chloride, acetaminophen, ALPRAZolam, nitroGLYCERIN, ondansetron (ZOFRAN) IV, oxyCODONE, phenol, sodium chloride flush, zolpidem   Assessment/Plan   1. 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  He has permanent atrial fibrillation. Dysfunction of his mechanical valves does not appear to explain his worsening CHF this year. 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 [place] 04/07/19 showed mildly elevated filling pressures.He was doing well until earlier this month, then has developed progressive volume overload and was admitted.  Echo done this admission after VT arrest with DCCV x 3 showed EF 30-35% with global HK, normal RV size and systolic function, mechanical MV  mean gradient 5.7 and mild MR, mechanical AoV mean gradient 12, dilated IVC.  It is possible fall in EF is related to stunning from VT arrest. HS-TnI was not significantly elevated.  He is s/p Chemical engineer CRT-D placement on 10/26, LV lead is off.  Remains on lasix gtt at 15 mg/hr, diuresed well again yesterday, now CVP 13.  Of note, PYP scan showed no evidence for ATTR amyloidosis. Valves sounds remain crisp on exam and gradients stable on echo. - CVP 11-12 . Continue lasix 15 mg/hr today. Supp K - No metolazone.  -Continue spironolactone at 12.5 mg daily. - He is on midodrine 10 mg tid currently to maintain BP, will continue for now.  - Continue digoxin 0.125 daily, level ok today.  - Discussed with Lance Cook, LV lead off due to diaphragmatic stimulation and will try different vectors for LV pacing when he is seen in office followup.  2. Valvular  heart disease: Patient has mechanical mitral and aortic valves. TEE in 8/20 showed mechanical MV with mean gradient 5 mmHg, mild peri-valvular regurgitation. The aortic valve appeared to open reasonably well but mean gradient was 23 mmHg, ?component of mild patient-prosthesis mismatch. Echo this admission showed stable gradients (lower for aortic valve).  - Continue ASA 81 and warfarin with INR goal 2.5-3.5 => INR 2.4 this morning, no heparin gtt with recent device. 3. Atrial fibrillation: Chronic. Long-standing atrial fibrillation with severely dilated atria, unlikely to have a successful cardioversion.  -Continue Toprol XL at current dose.  4. VT: Patient with VT this admission, rate 200s, monomorphic.  He has not had episodes of palpitations or dizziness at home prior to admission.  He did not lose consciousness or require CPR.  Cardioversion x 3 failed, he converted back to atrial fibrillation after amiodarone boluses and remains in atrial fibrillation currently.  No chest pain and HS-TnI not significantly elevated, think ACS is unlikely. He  was hypokalemic.  Cath in 4/16 with no significant disease. No further VT overnight.  Now with [facility] Scientific CRT-D device.  - Stopped IV amiodarone, continue amiodarone 200 mg bid.  - Keep K> 4.0 Mg > 2.0  5. CAD: Last cath in 4/16 with patent native coronaries, probably occluded SVG-RCA. Doubt CAD as cause of his decompensation. No chest pain with minimally elevated HS-TnI. Would not repeat coronary angio at this time.  6. Type 2 diabetes: SSI.  7. OSA: CPAP. 8. Anemia, iron-deficient: received feraheme 02/22/2019 and again on 04/05/19 but had severe reaction to feraheme.Placed on allergy list. 9. Hyponatremia: Sodium 129 .  Fluid restrict and follow closely.  10. Hypokalemia - Supp K  11. ID: WBCs trending down, no fever.  UA negative. Bcx NGTD. - follow  12. Deconditioning: Mobilize, PT.     Length of Stay: [place], NP  06/29/2019, 11:54 AM  Advanced Heart Failure Team Pager [phone] (M-F; 7a - 4p)  Please contact [facility] for night-coverage after hours (4p -7a ) and weekends on [email or web address]  Patient seen with NP, agree with the above note.   He is stable today, good diuresis yesterday.  CVP 11-12.  Still feels swollen.  Creatinine stable at 1.25.   General: NAD Neck: JVP 10-11, no thyromegaly or thyroid nodule.  Lungs: Clear to auscultation bilaterally with normal respiratory effort. CV: Nondisplaced PMI.  Heart irregular S1/S2, no S3/S4, 2/6 SEM RUSB.  1+ edema to knees.   Abdomen: Soft, nontender, no hepatosplenomegaly, no distention.  Skin: Intact without lesions or rashes.  Neurologic: Alert and oriented x 3.  Psych: Normal affect. Extremities: No clubbing or cyanosis.  HEENT: Normal.   I agree with 1 more day of aggressive diuresis, continue Lasix gtt today.  Hopefully will be able to convert over to po tomorrow.   HR is controlled, he remains in atrial fibrillation, he is not pacing (LV lead off).   Lance Cook 06/29/2019 1:00 PM

## 2019-06-29 NOTE — Progress Notes (Signed)
Went to put patient on CPAP and RN informed me that patient was still not ready to go on.  She said she would call me when patient was ready.

## 2019-06-29 NOTE — Progress Notes (Signed)
[place] for warfarin Indication: Mechanical AVR Lance Cook 1988), mech Mitral valve 5/16, Afib (CHADS2VASc = 4)  Allergies  Allergen Reactions  . Feraheme [Ferumoxytol] Shortness Of Breath    SOB/Tachycardia  . Penicillins Hives and Rash    Has patient had a PCN reaction causing immediate rash, facial/tongue/throat swelling, SOB or lightheadedness with hypotension: Yes Has patient had a PCN reaction causing severe rash involving mucus membranes or skin necrosis: Yes Has patient had a PCN reaction that required hospitalization unsure Has patient had a PCN reaction occurring within the last 10 years: 2010-2012 If all of the above answers are "NO", then may proceed with Cephalosporin use.  Lance Cook Other (See Comments)    CDIFF, when taking oral tablets   . Tape Other (See Comments) and Itching    Skin irritation    Patient Measurements: Height: 5' (152.4 cm) Weight: 179 lb 14.3 oz (81.6 kg) IBW/kg (Calculated) : 50 Heparin dosing weight: 69 kg  Vital Signs: Temp: 97.4 F (36.3 C) (10/28 0748) Temp Source: Oral (10/28 0748) BP: 100/67 (10/28 0957) Pulse Rate: 73 (10/28 0957)  Labs: Recent Labs    06/27/19 0334 06/28/19 0355 06/29/19 0409  HGB 9.5* 9.5* 9.5*  HCT 31.6* 31.8* 31.9*  PLT 195 214 188  LABPROT 24.4* 25.6* 23.5*  INR 2.2* 2.4* 2.1*  CREATININE 1.33* 1.39* 1.25*    Estimated Creatinine Clearance: 57.7 mL/min (A) (by C-G formula based on SCr of 1.25 mg/dL (H)).   Assessment: 58 y.o. 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   Home dose is 5mg  daily, last taken 10/19. INR fluctuating outpatient - was therapeutic on 10/13 at 2.6 but INR 1.2 on 10/6 and 2.6 on 9/29.   INR up 4.9 10/23, now 2.1 (held x 2d, and resumed 10/24). Started on IV amiodarone 10/21 for VT, transitioned to PO 10/27, will need to watch INR  closely.   ICD implant and AV ablation done 10/26. No further heparin post device per EP. Note goal INR <3 for the next week while pacemaker pocket healing.  Goal of Therapy:  INR 2.5 - 3.5  Monitor platelets by anticoagulation protocol: Yes   Plan:  Give warfarin 4 mg tonight Daily INR Monitor for bleeding   Lance Cook, PharmD PGY2 Cardiology Pharmacy Resident Phone [phone] 06/29/2019       11:36 AM  Please check [email or web address] for unit-specific pharmacist phone numbers

## 2019-06-29 NOTE — Progress Notes (Signed)
Orthopedic Tech Progress Note Patient Details:  Lance Cook [birth year] [id number]  Patient ID: Lance Cook, male   DOB: [birth year], 58 y.o.   MRN: [id number]   [name] 06/29/2019, 2:22 PMUnna boots discontinued, patient in compression hose.

## 2019-06-29 NOTE — Progress Notes (Signed)
Physical Therapy Treatment Patient Details Name: Lance Cook MRN: [id number] DOB: 10/11/1960 Today's Date: 06/29/2019    History of Present Illness Patient is a 58 y/o male who presents with increased weight gain and edema. Admitted with acute on chronic diastolic CHF, s/p cardioversion 10/21 secondary to unstable VT x3 shocks. Plan for ICD Monday, 10/26.PMH includes CHF, MVR, AVR, A fib, AA dissection, DM, HTN, PVD, testicular CA, CVA at 58 yo.    PT Comments    Pt tolerated treatment well, ambulating limited community distances with use of RW without physical assistance. Pt requires multiple brief standing rest breaks due to fatigue but demonstrates no increased work of breathing and reports no SOB during activity. HR remains stable from 65-85 BPM dring mobility. Pt will benefit from continued acute PT services to restore independence in mobility and initiate stair training next session. PT now recommends home health PT to monitor return to exercise in the home setting as patient likely to push himself.    Follow Up Recommendations  Home health PT(to monitor graded return to exercise in home settingf)     Equipment Recommendations  None recommended by PT    Recommendations for Other Services       Precautions / Restrictions Precautions Precautions: Fall Restrictions Weight Bearing Restrictions: No    Mobility  Bed Mobility               General bed mobility comments: pt sitting edge of bed upon arrival  Transfers Overall transfer level: Independent Equipment used: None Transfers: Sit to/from Stand Sit to Stand: Independent            Ambulation/Gait Ambulation/Gait assistance: Supervision Gait Distance (Feet): 300 Feet Assistive device: Rolling walker (2 wheeled) Gait Pattern/deviations: Step-through pattern Gait velocity: varying speed Gait velocity interpretation: >2.62 ft/sec, indicative of community ambulatory General Gait Details: pt with steady step  through gait, able to change gait speed when needed and stop abruptly without LOB   Stairs             Wheelchair Mobility    Modified Rankin (Stroke Patients Only)       Balance Overall balance assessment: Modified Independent Sitting-balance support: Feet supported;No upper extremity supported Sitting balance-Leahy Scale: Good Sitting balance - Comments: modI at edge of bed   Standing balance support: Bilateral upper extremity supported Standing balance-Leahy Scale: Good Standing balance comment: modI for static standing, supervisin for dynamic standing                            Cognition Arousal/Alertness: Awake/alert Behavior During Therapy: WFL for tasks assessed/performed Overall Cognitive Status: Within Functional Limits for tasks assessed                                        Exercises      General Comments General comments (skin integrity, edema, etc.): HR up to 85 BPM max, pulse ox with poor reading during session, pt able to converse with PT during ambulation denying SOB      Pertinent Vitals/Pain Pain Assessment: No/denies pain    Home Living                      Prior Function            PT Goals (current goals can now be found in the care plan  section) Acute Rehab PT Goals Patient Stated Goal: to get stronger and go home Progress towards PT goals: Progressing toward goals    Frequency    Min 3X/week      PT Plan Current plan remains appropriate    Co-evaluation              AM-PAC PT "6 Clicks" Mobility   Outcome Measure  Help needed turning from your back to your side while in a flat bed without using bedrails?: None Help needed moving from lying on your back to sitting on the side of a flat bed without using bedrails?: None Help needed moving to and from a bed to a chair (including a wheelchair)?: None Help needed standing up from a chair using your arms (e.g., wheelchair or bedside  chair)?: None Help needed to walk in hospital room?: None Help needed climbing 3-5 steps with a railing? : A Little 6 Click Score: 23    End of Session Equipment Utilized During Treatment: Gait belt Activity Tolerance: Patient tolerated treatment well Patient left: in chair;with call bell/phone within reach Nurse Communication: Mobility status PT Visit Diagnosis: Difficulty in walking, not elsewhere classified (R26.2);Unsteadiness on feet (R26.81);Muscle weakness (generalized) (M62.81)     Time: [id number] PT Time Calculation (min) (ACUTE ONLY): 23 min  Charges:  $Gait Training: 8-22 mins $Therapeutic Activity: 8-22 mins                     Zenaida Niece, PT, DPT Acute Rehabilitation Pager: [phone]    Zenaida Niece 06/29/2019, 1:15 PM

## 2019-06-30 DIAGNOSIS — I5023 Acute on chronic systolic (congestive) heart failure: Secondary | ICD-10-CM | POA: Diagnosis not present

## 2019-06-30 LAB — GLUCOSE, CAPILLARY
Glucose-Capillary: 70 mg/dL (ref 70–99)
Glucose-Capillary: 106 mg/dL — ABNORMAL HIGH (ref 70–99)
Glucose-Capillary: 99 mg/dL (ref 70–99)
Glucose-Capillary: 74 mg/dL (ref 70–99)

## 2019-06-30 LAB — CULTURE, BLOOD (ROUTINE X 2)
Culture: NO GROWTH
Culture: NO GROWTH
Special Requests: ADEQUATE
Special Requests: ADEQUATE

## 2019-06-30 LAB — CBC
HCT: 32.3 % — ABNORMAL LOW (ref 39.0–52.0)
Hemoglobin: 9.4 g/dL — ABNORMAL LOW (ref 13.0–17.0)
MCH: 22.1 pg — ABNORMAL LOW (ref 26.0–34.0)
MCHC: 29.1 g/dL — ABNORMAL LOW (ref 30.0–36.0)
MCV: 75.8 fL — ABNORMAL LOW (ref 80.0–100.0)
Platelets: 208 10*3/uL (ref 150–400)
RBC: 4.26 MIL/uL (ref 4.22–5.81)
RDW: 19.7 % — ABNORMAL HIGH (ref 11.5–15.5)
WBC: 12.8 10*3/uL — ABNORMAL HIGH (ref 4.0–10.5)
nRBC: 0 % (ref 0.0–0.2)

## 2019-06-30 LAB — PROTIME-INR
INR: 1.8 — ABNORMAL HIGH (ref 0.8–1.2)
Prothrombin Time: 20.8 seconds — ABNORMAL HIGH (ref 11.4–15.2)

## 2019-06-30 LAB — BASIC METABOLIC PANEL
Anion gap: 9 (ref 5–15)
BUN: 38 mg/dL — ABNORMAL HIGH (ref 6–20)
CO2: 32 mmol/L (ref 22–32)
Calcium: 7.9 mg/dL — ABNORMAL LOW (ref 8.9–10.3)
Chloride: 88 mmol/L — ABNORMAL LOW (ref 98–111)
Creatinine, Ser: 1.26 mg/dL — ABNORMAL HIGH (ref 0.61–1.24)
GFR calc Af Amer: >60 mL/min (ref >60)
GFR calc non Af Amer: >60 mL/min (ref >60)
Glucose, Bld: 78 mg/dL (ref 70–99)
Potassium: 3.8 mmol/L (ref 3.5–5.1)
Sodium: 129 mmol/L — ABNORMAL LOW (ref 135–145)

## 2019-06-30 MED ORDER — TORSEMIDE 20 MG PO TABS
80.0000 mg | ORAL_TABLET | Freq: Two times a day (BID) | ORAL | Status: DC
  Administered 2019-06-30 – 2019-07-01 (×2): 80 mg via ORAL
  Filled 2019-06-30 (×2): qty 4

## 2019-06-30 MED ORDER — WARFARIN SODIUM 4 MG PO TABS
4.0000 mg | ORAL_TABLET | Freq: Once | ORAL | Status: AC
  Administered 2019-06-30: 4 mg via ORAL
  Filled 2019-06-30: qty 1

## 2019-06-30 MED ORDER — POTASSIUM CHLORIDE CRYS ER 20 MEQ PO TBCR
40.0000 meq | EXTENDED_RELEASE_TABLET | Freq: Three times a day (TID) | ORAL | Status: DC
  Administered 2019-06-30 – 2019-07-01 (×3): 40 meq via ORAL
  Filled 2019-06-30 (×3): qty 2

## 2019-06-30 NOTE — Progress Notes (Signed)
Pt willing to walk. Pt's telemetry was offline and being troubleshooted. Will follow up with pt later on if times allows for ambulation.   [place], ACSM CEP 06/30/2019

## 2019-06-30 NOTE — Progress Notes (Signed)
Patient ID: Lance Cook, male   DOB: [birth year], 58 y.o.   MRN: [id number]     Advanced Heart Failure Rounding Note  PCP-Cardiologist: [name], MD   Subjective:    [facility] Scientific BiV ICD placed this admit but patient had diaphragmatic stimulation, LV lead turned off.    On lasix gtt at 15 mg/hr. Brisk diuresis. CVP 10.   Doing ok, walked with PT.   Objective:   Weight Range: 81.4 kg Body mass index is 35.05 kg/m.   Vital Signs:   Temp:  [97.3 F (36.3 C)-97.9 F (36.6 C)] 97.7 F (36.5 C) (10/29 0745) Pulse Rate:  [61-70] 70 (10/29 0745) Resp:  [16-20] 16 (10/29 0745) BP: (96-107)/(55-65) 96/55 (10/29 0745) SpO2:  [94 %-100 %] 97 % (10/29 0745) Weight:  [81.4 kg] 81.4 kg (10/29 0258) Last BM Date: 06/29/19  Weight change: Filed Weights   06/28/19 0405 06/29/19 0540 06/30/19 0258  Weight: 83.7 kg 81.6 kg 81.4 kg    Intake/Output:   Intake/Output Summary (Last 24 hours) at 06/30/2019 1014 Last data filed at 06/30/2019 0844 Gross per 24 hour  Intake 1730 ml  Output 1726 ml  Net 4 ml      Physical Exam   CVP 10  General: NAD Neck: JVP 8 cm, no thyromegaly or thyroid nodule.  Lungs: Clear to auscultation bilaterally with normal respiratory effort. CV: Nondisplaced PMI.  Heart irregular mechanical S1/S2, no S3/S4, 2/6 SEM RUSB.  1+ ankle edema.   Abdomen: Soft, nontender, no hepatosplenomegaly, no distention.  Skin: Intact without lesions or rashes.  Neurologic: Alert and oriented x 3.  Psych: Normal affect. Extremities: No clubbing or cyanosis.  HEENT: Normal.    Telemetry   A fib 70s personally reviewed.   Labs    CBC Recent Labs    06/29/19 0409 06/30/19 0415  WBC 11.5* 12.8*  HGB 9.5* 9.4*  HCT 31.9* 32.3*  MCV 74.7* 75.8*  PLT 188 [id number]   Basic Metabolic Panel Recent Labs    06/29/19 0409 06/30/19 0415  NA 129* 129*  K 3.2* 3.8  CL 87* 88*  CO2 34* 32  GLUCOSE 78 78  BUN 41* 38*  CREATININE 1.25* 1.26*  CALCIUM 8.0* 7.9*    Liver Function Tests No results for input(s): AST, ALT, ALKPHOS, BILITOT, PROT, ALBUMIN in the last 72 hours. No results for input(s): LIPASE, AMYLASE in the last 72 hours. Cardiac Enzymes No results for input(s): CKTOTAL, CKMB, CKMBINDEX, TROPONINI in the last 72 hours.  BNP: BNP (last 3 results) Recent Labs    04/19/19 1316 04/25/19 1107 06/21/19 1752  BNP 195.6* 194.9* 381.5*    ProBNP (last 3 results) No results for input(s): PROBNP in the last 8760 hours.   D-Dimer No results for input(s): DDIMER in the last 72 hours. Hemoglobin A1C No results for input(s): HGBA1C in the last 72 hours. Fasting Lipid Panel No results for input(s): CHOL, HDL, LDLCALC, TRIG, CHOLHDL, LDLDIRECT in the last 72 hours. Thyroid Function Tests No results for input(s): TSH, T4TOTAL, T3FREE, THYROIDAB in the last 72 hours.  Invalid input(s): FREET3  Other results:   Imaging    No results found.   Medications:     Scheduled Medications: . allopurinol  300 mg Oral Daily  . amiodarone  200 mg Oral BID  . Chlorhexidine Gluconate Cloth  6 each Topical Daily  . digoxin  0.125 mg Oral Daily  . gabapentin  300 mg Oral TID  . Gerhardt's butt cream   Topical  TID  . insulin aspart  0-15 Units Subcutaneous TID WC  . insulin aspart  0-5 Units Subcutaneous QHS  . magnesium oxide  400 mg Oral TID  . metoprolol succinate  12.5 mg Oral BID  . midodrine  10 mg Oral TID WC  . mupirocin ointment  1 application Topical TID  . potassium chloride  40 mEq Oral 5 X Daily  . sodium chloride flush  10-40 mL Intracatheter Q12H  . sodium chloride flush  3 mL Intravenous Once  . spironolactone  25 mg Oral Daily  . torsemide  80 mg Oral BID  . traZODone  100 mg Oral QHS  . warfarin  4 mg Oral ONCE-1800  . Warfarin - Pharmacist Dosing Inpatient   Does not apply q1800    Infusions: . sodium chloride    . sodium chloride    . sodium chloride Stopped (06/28/19 1038)    PRN Medications: sodium  chloride, acetaminophen, ALPRAZolam, nitroGLYCERIN, ondansetron (ZOFRAN) IV, oxyCODONE, phenol, sodium chloride flush, zolpidem   Assessment/Plan   1. 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  He has permanent atrial fibrillation. Dysfunction of his mechanical valves does not appear to explain his worsening CHF this year. 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 [place] 04/07/19 showed mildly elevated filling pressures.He was doing well until earlier this month, then has developed progressive volume overload and was admitted.  Echo done this admission after VT arrest with DCCV x 3 showed EF 30-35% with global HK, normal RV size and systolic function, mechanical MV mean gradient 5.7 and mild MR, mechanical AoV mean gradient 12, dilated IVC.  It is possible fall in EF is related to stunning from VT arrest. HS-TnI was not significantly elevated.  He is s/p Chemical engineer CRT-D placement on 10/26, LV lead is off.  Remains on lasix gtt at 15 mg/hr, weight down about 12 lbs overall and CVP down to 10.  Of note, PYP scan showed no evidence for ATTR amyloidosis. Valves sounds remain crisp on exam and gradients stable on echo. - Stop IV Lasix today, start torsemide 80 mg bid.  -Continue spironolactone at 25 mg daily. - He is on midodrine 10 mg tid currently to maintain BP, will continue for now.  - Continue digoxin 0.125 daily, level ok.  - He remains on Toprol XL 12.5 bid.  - Discussed with Chanetta Marshall, LV lead off due to diaphragmatic stimulation and will try different vectors for LV pacing when he is seen in office followup.  2. Valvular heart disease: Patient has mechanical mitral and aortic valves. TEE in 8/20 showed mechanical MV with mean gradient 5 mmHg, mild  peri-valvular regurgitation. The aortic valve appeared to open reasonably well but mean gradient was 23 mmHg, ?component of mild patient-prosthesis mismatch. Echo this admission showed stable gradients (lower for aortic valve).  - Continue ASA 81 and warfarin with INR goal 2.5-3.5 => INR 1.8 this morning, no heparin gtt with recent device. 3. Atrial fibrillation: Chronic. Long-standing atrial fibrillation with severely dilated atria, unlikely to have a successful cardioversion.  -Continue Toprol XL at current dose.  4. VT: Patient with VT this admission, rate 200s, monomorphic.  He has not had episodes of palpitations or dizziness at home prior to admission.  He did not lose consciousness or require CPR.  Cardioversion  x 3 failed, he converted back to atrial fibrillation after amiodarone boluses and remains in atrial fibrillation currently.  No chest pain and HS-TnI not significantly elevated, think ACS is unlikely. He was hypokalemic.  Cath in 4/16 with no significant disease. No further VT overnight.  Now with [facility] Scientific CRT-D device.  - Stopped IV amiodarone, continue amiodarone 200 mg bid.  - Keep K> 4.0 Mg > 2.0  5. CAD: Last cath in 4/16 with patent native coronaries, probably occluded SVG-RCA. Doubt CAD as cause of his decompensation. No chest pain with minimally elevated HS-TnI. Would not repeat coronary angio at this time.  6. Type 2 diabetes: SSI.  7. OSA: CPAP. 8. Anemia, iron-deficient: received feraheme 02/22/2019 and again on 04/05/19 but had severe reaction to feraheme.Placed on allergy list. 9. Hyponatremia: Sodium 129 .  Fluid restrict and follow closely.  10. Hypokalemia - Supp K  11. ID: WBCs trending down, no fever.  UA negative. Bcx NGTD. - follow  12. Deconditioning: Mobilize, PT.    Hopefully home soon.   Length of Stay: [place], MD  06/30/2019, 10:14 AM  Advanced Heart Failure Team Pager [phone] (M-F; 7a - 4p)  Please contact [facility]  for night-coverage after hours (4p -7a ) and weekends on [email or web address]

## 2019-06-30 NOTE — Progress Notes (Signed)
Intercourse for warfarin Indication: Mechanical AVR [name] 1988), mech Mitral valve 5/16, Afib (CHADS2VASc = 4)  Allergies  Allergen Reactions  . Feraheme [Ferumoxytol] Shortness Of Breath    SOB/Tachycardia  . Penicillins Hives and Rash    Has patient had a PCN reaction causing immediate rash, facial/tongue/throat swelling, SOB or lightheadedness with hypotension: Yes Has patient had a PCN reaction causing severe rash involving mucus membranes or skin necrosis: Yes Has patient had a PCN reaction that required hospitalization unsure Has patient had a PCN reaction occurring within the last 10 years: 2010-2012 If all of the above answers are "NO", then may proceed with Cephalosporin use.  [name] Other (See Comments)    CDIFF, when taking oral tablets   . Tape Other (See Comments) and Itching    Skin irritation    Patient Measurements: Height: 5' (152.4 cm) Weight: 179 lb 7.3 oz (81.4 kg) IBW/kg (Calculated) : 50 Heparin dosing weight: 69 kg  Vital Signs: Temp: 97.7 F (36.5 C) (10/29 0745) Temp Source: Other (Comment) (10/29 0745) BP: 96/55 (10/29 0745) Pulse Rate: 70 (10/29 0745)  Labs: Recent Labs    06/28/19 0355 06/29/19 0409 06/30/19 0415  HGB 9.5* 9.5* 9.4*  HCT 31.8* 31.9* 32.3*  PLT 214 188 208  LABPROT 25.6* 23.5* 20.8*  INR 2.4* 2.1* 1.8*  CREATININE 1.39* 1.25* 1.26*    Estimated Creatinine Clearance: 57.3 mL/min (A) (by C-G formula based on SCr of 1.26 mg/dL (H)).   Assessment: 58 y.o. 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   Home dose is 5mg  daily, last taken 10/19. INR fluctuating outpatient - was therapeutic on 10/13 at 2.6 but INR 1.2 on 10/6 and 2.6 on 9/29.   INR up 4.9 10/23, now 1.8 (held x 2d, and resumed 10/24). Started on IV amiodarone 10/21 for VT, transitioned to PO 10/27, will need to  watch INR closely.   ICD implant and AV ablation done 10/26. No further heparin post device per EP. Note goal INR <3 for the next week while pacemaker pocket healing.  Goal of Therapy:  INR 2.5 - 3.5  Monitor platelets by anticoagulation protocol: Yes   Plan:  Give warfarin 4 mg tonight Daily INR Monitor for bleeding   [name], PharmD PGY2 Cardiology Pharmacy Resident Phone [phone] 06/30/2019       9:40 AM  Please check [email or web address] for unit-specific pharmacist phone numbers

## 2019-06-30 NOTE — TOC Initial Note (Signed)
Transition of Care [facility]) - Initial/Assessment Note    Patient Details  Name: Declan Mwangi MRN: [id number] Date of Birth: 12/12/1960  [facility]) CM/SW Contact:    Zenon Mayo, RN Phone Number: 06/30/2019, 11:18 AM  Clinical Narrative:                 Patient is active with [facility] for [facility], per pt eval he will need HHPT also.  Will need to add thsi order with [facility],  Butch Penny notified.  Patient may need  Cab voucher to go home if can not get Medicare Transport.  Expected Discharge Plan: [place] Barriers to Discharge: No Barriers Identified   Patient Goals and CMS Choice Patient states their goals for this hospitalization and ongoing recovery are:: to feel better and build up longevity to walk CMS Medicare.gov Compare Post Acute Care list provided to:: Patient Choice offered to / list presented to : Patient  Expected Discharge Plan and Services Expected Discharge Plan: [place] In-house Referral: NA Discharge Planning Services: CM Consult Post Acute Care Choice: Durable Medical Equipment(has cpap machine, walker) Living arrangements for the past 2 months: Single Family Home                 DME Arranged: (NA)         HH Arranged: RN, PT, Disease Management [employer] Agency: [place] ([facility]) Date HH Agency Contacted: 06/30/19 Time HH Agency Contacted: 1117 Representative spoke with at [place]: Butch Penny  Prior Living Arrangements/Services Living arrangements for the past 2 months: [place] with:: Spouse Patient language and need for interpreter reviewed:: Yes Do you feel safe going back to the place where you live?: Yes      Need for Family Participation in Patient Care: No (Comment) Care giver support system in place?: No (comment) Current home services: Home RN([facility]) Criminal Activity/Legal Involvement Pertinent to Current Situation/Hospitalization: No - Comment as needed  Activities of Daily  Living Home Assistive Devices/Equipment: Environmental consultant (specify type) ADL Screening (condition at time of admission) Patient's cognitive ability adequate to safely complete daily activities?: Yes Is the patient deaf or have difficulty hearing?: No Does the patient have difficulty seeing, even when wearing glasses/contacts?: No Does the patient have difficulty concentrating, remembering, or making decisions?: No Patient able to express need for assistance with ADLs?: Yes Does the patient have difficulty dressing or bathing?: No Independently performs ADLs?: Yes (appropriate for developmental age) Does the patient have difficulty walking or climbing stairs?: Yes Weakness of Legs: Both Weakness of Arms/Hands: None  Permission Sought/Granted                  Emotional Assessment Appearance:: Appears stated age Attitude/Demeanor/Rapport: Engaged Affect (typically observed): Appropriate Orientation: : Oriented to Self, Oriented to Place, Oriented to  Time, Oriented to Situation Alcohol / Substance Use: Not Applicable Psych Involvement: No (comment)  Admission diagnosis:  Acute on chronic diastolic heart failure (HCC) [I50.33] Peripheral edema [R60.9] Patient Active Problem List   Diagnosis Date Noted  . Acute on chronic systolic CHF (congestive heart failure), NYHA class 4 ([place]) 06/21/2019  . Acute on chronic diastolic (congestive) heart failure ([place]) 04/01/2019  . Acute on chronic congestive heart failure ([place])   . Anasarca   . Pressure injury of skin 02/14/2019  . Acute on chronic heart failure ([place]) 02/13/2019  . Benign neoplasm of ascending colon   . Benign neoplasm of rectum   . Benign neoplasm  of rectosigmoid junction   . Benign neoplasm of sigmoid colon   . DM type 2 with diabetic dyslipidemia ([place]) 04/26/2018  . Lower extremity edema 03/08/2018  . Other fatigue 01/28/2018  . Chronic anticoagulation 01/05/2018  . Insomnia 11/23/2017  . Unilateral vocal cord paralysis  11/30/2016  . Leg swelling 11/28/2016  . S/P MVR (mitral valve replacement)   . Acute on chronic diastolic CHF (congestive heart failure) ([place]) 08/27/2016  . Chronic pain syndrome 07/10/2016  . Chronic diarrhea   . Benign neoplasm of transverse colon   . Scrotal swelling 05/30/2016  . Musculoskeletal pain 01/07/2016  . Personal history of malignant neoplasm of testis 07/12/2015  . Long term current use of opiate analgesic 07/11/2015  . Long term prescription opiate use 07/11/2015  . Opiate use (22.5 MME/day) 07/11/2015  . Opiate dependence ([place]) 07/11/2015  . Encounter for therapeutic drug level monitoring 07/11/2015  . Chronic low back pain (midline) 07/11/2015  . Lumbar facet syndrome 07/11/2015  . Chronic lower extremity pain (Left) 07/11/2015  . Adult BMI 30+ 06/19/2015  . Obesity (BMI 30-39.9) 06/19/2015  . Hypomagnesemia 02/08/2015  . Physical deconditioning 02/08/2015  . Enteritis due to Clostridium difficile 01/30/2015  . 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 [id number]  . Chronic diastolic congestive heart failure ([place])   . Type 2 diabetes mellitus with other specified complication ([place]) [id number]  . 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 Venous insufficiency of both lower extremities 04/02/2014  . Morbid obesity ([place]) 04/02/2014  . Diabetes mellitus ([place]) 03/14/2014  . Personal history of transient ischemic attack (TIA), and cerebral infarction without residual deficits 02/23/2014  . Malignant neoplasm of testis ([place]) 02/23/2014  . H/O TIA (transient ischemic attack) and stroke 02/23/2014  . HTN (hypertension) 02/23/2014  . Lymphadenopathy 02/23/2014  . S/P AVR (aortic valve replacement) 02/23/2014  . Encounter for therapeutic drug monitoring 02/02/2014  . Long term current use of anticoagulant therapy 07/15/2012  . Hypogonadism male 04/08/2012  . 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 Testicular hypofunction  04/08/2012  . Male hypogonadism 04/08/2012  . Other testicular hypofunction 04/08/2012  . History of colonic polyps 12/23/2011  . Testicular cancer ([place])   . TIA (transient ischemic attack)   . OSA (obstructive sleep apnea)   . Lumbar spinal stenosis (Severe L4-5) 10/08/2011  . ED (erectile dysfunction) of organic origin 10/08/2011  . Lumbar canal stenosis 10/08/2011  . Low back pain 10/08/2011  . Spinal stenosis of lumbar region without neurogenic claudication 10/08/2011  . CAD (coronary artery disease)   . Spondylosis 04/29/2010  . DYSLIPIDEMIA 01/15/2010  . Chronic venous hypertension with ulcer ([place]) 01/15/2010  . Essential hypertension 12/14/2009  . Gout 11/01/2009  . Anemia, iron deficiency 11/01/2009  . MYOCARDIAL INFARCTION, HX OF 11/01/2009  . Atrial fibrillation, chronic 11/01/2009  . PERIPHERAL EDEMA 11/01/2009  . Heart valve replaced by other means 11/01/2009  . Presence of other heart-valve replacement 11/01/2009  . Ascending aortic dissection ([place]) 07/14/2008  . 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 Venous (peripheral) insufficiency 02/03/2005   PCP:  [name], MD Pharmacy:   [facility] - [place], [place] - [address] [place] [address] Phone: [phone] Fax: [phone]  [facility] - [place], [place] [address] Phone: [phone] Fax: [phone]     Social Determinants of  Health (SDOH) Interventions    Readmission Risk Interventions Readmission Risk Prevention Plan 06/30/2019  Transportation Screening Complete  Medication Review Press photographer) Complete  PCP or Specialist appointment within 3-5 days of discharge Complete  HRI or [place] Complete  SW Recovery Care/Counseling Consult Complete  [place] Not  Applicable  Some recent data might be hidden

## 2019-06-30 NOTE — Progress Notes (Signed)
CARDIAC REHAB PHASE I   PRE:  Rate/Rhythm: 70 A.Fib  BP:  Sitting: 91/52      SaO2: 100% RA  MODE:  Ambulation: 300 ft   POST:  Rate/Rhythm: 90 A.Fib  BP:  Sitting: 103/64       SaO2: 99% RA   Pt eager to walk. Pt ambulated 300 ft with steady gait using RW. Pt took two standing rest breaks during walk. Pt reported mild SOB. Pt returned to recliner per request. Assisted pt with lunch tray. Call bell within reach.   Carma Lair MS, ACSM CEP  11:48 AM 06/30/2019

## 2019-06-30 NOTE — Plan of Care (Signed)

## 2019-06-30 NOTE — Discharge Summary (Signed)
Advanced Heart Failure Team  Discharge Summary   Patient ID: Lance Cook MRN: [id number], DOB/AGE: [birth year] 58 y.o. 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 Valvular heart disease 3. Atrial fibrillation: Chronic 4. VT 5. CAD: Last cath in 4/16 with patent native coronaries, probably occluded SVG-RCA.  6. Type 2 diabetes 7. OSA. 8. Anemia 9. Hyponatremia 10. Hypokalemia 11. ID 12. Deconditioning   Hospital Course:  [name] a58 y.o.with history of Bentall procedure with [employer] Jude mechanical aortic alve and conduit in 1988. 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 This procedure was complicated by ARDS and prolonged time on the vent. He had SVG-RCA with original surgery. Last cath in 4/16 prior to MV surgery showed patent native coronaries, SVG-RCA likely occluded (never identified). He has had chronic atrial fibrillation and has history of CVA.   Initially after MV surgery, he did fairly well. 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 He had reasonable quality of life on po Lasix with occasional episodes of fluid retention that were manageable. However, starting in the Spring of 2020, he began to decompensate. He was admitted in 6/20 and was diuresed with IV Lasix, he lost 20 lbs. However, after discharge, po diuretics were not effective at managing the re-development of volume overload. He was re-admitted 04/01/19 with marked volume overload. Diuresed with lasix drip and later transitioned to torsemide 60 mg twice a day. Hospital course complicated by severe allergic reaction to feraheme. He was placed on bipap, solumedrol, Neo and given midodrine. Discharge weight was 165.5 pounds. While in the hospital, he had a TEE showing markedly dilated LA, LV with EF 50%, septal-lateral dyssynchrony, normal RV systolic function/size,  mechanical MV with mean gradient 5 and mild peri-valvular regurgitation, mechanical AoV with mean gradient 23, appeared to open well.   After discharge, weight trended up. He has been seen by frequently with adjustment of diuretics. Prior to this admit, diuretic regimen included torsemide 80 bid, spironolactone 25 mg daily and metolazone 5 mg 2 days a week (Mon + Fri).    On 10/20, he presented to the [facility] ED w/ complaints of increased dyspnea and 25 lb wt gain. Started on IV diuretics for a/c diastolic HF. Morning of 10/21, pt went into WCT in the 200s requiring defibrillation. ? afib w/ aberancy  vs VT. Started on IV amiodarone. EP consulted.  Confirmed to be VT. Rate 200s, monomorphic. no LOC. Did not require CPR. Cardioversion x 3 failed, he converted back to atrial fibrillation after amiodarone boluses. Had no recurrent VT. No chest pain and HS-TnI not significantly elevated, making ACS/coronary ischemia unlikely. PYP scan showed no evidence for ATTR amyloidosis. He underwent placement of a [employer] CRT-D device on 10/26 and was continued on amiodarone.  Unfortunately, he had diaphragmatic stimulation, LV lead turned off.  per Dr. [name], will hold off on revision for now and in the future could consider Left bundle pacing if needed.   On 07/01/19, he was seen and examined by Dr. [name] and felt stable for discharge home. He was instructed no driving x 6 months. [facility], device clinic f/u and f/u INR check arranged.   See below for detailed problems list. He will contine to be followed closely in the HF clinic. [facility] will resume care at discharge.   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 He has permanentatrial fibrillation. Dysfunction of his mechanical valves does not  appear to explain his worsening CHFthis year. 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 RHC 8/6/20showed mildly elevated filling pressures.He was doing well until earlier this month, then has developed progressive volume overload and was admitted.  Echo done this admission after VT arrest with DCCV x 3 showed EF 30-35% with global HK, normal RV size and systolic function, mechanical MV mean gradient 5.7 and mild MR, mechanical AoV mean gradient 12, dilated IVC.  It is possible fall in EF is related to stunning from VT arrest. HS-TnI was not significantly elevated.  He is s/p Chemical engineer CRT-D placement on 10/26, LV lead is off.  Of note, PYP scan showed no evidence for ATTR amyloidosis. Valves sounds remain crisp on exam and gradients stable on echo.  He is now back on po diuretics.  - Continue torsemide 80 mg bid.  -Continue spironolactoneat 25 mg daily. - He is on midodrine10 mg tidcurrently to maintain BP, will continue for now.  - Continue digoxin 0.125 daily, level ok.  - He remains on Toprol XL 12.5 bid.  - Discussed with Chanetta Marshall, LV lead off due to diaphragmatic stimulation and will try different vectors for LV pacing when he is seen in office followup.  2. Valvular heart disease: Patient has mechanical mitral and aortic valves. TEEin 8/20showed mechanical MV with mean gradient 5 mmHg, mild peri-valvular regurgitation. The aortic valve appeared to open reasonably well but mean gradient was 23 mmHg, ?component of mild patient-prosthesis mismatch. Echo this admission showed stable gradients (lower for aortic valve).  - Continue ASA 81 and warfarin with INR goal 2.5-3.5 => INR 1.7 this morning, no heparin bridge with recent device. 3. Atrial fibrillation: Chronic. Long-standing atrial fibrillation with severely dilated atria, unlikely to have a successful cardioversion.  -Continue Toprol XL at current dose. 4. VT: Patient with VT this admission,  rate 200s, monomorphic. He has not had episodes of palpitations or dizziness at home prior to admission. He did not lose consciousness or require CPR. Cardioversion x 3 failed, he converted back to atrial fibrillation after amiodarone boluses and remains in atrial fibrillation currently. No chest pain and HS-TnI not significantly elevated, think ACS is unlikely. He was hypokalemic. Cath in 4/16 with no significant disease. No further VT overnight.  Now with [facility] [employer] CRT-D device.  - Continue amiodarone 200 mg bid.  - Keep K> 4.0 Mg > 2.0  5. CAD: Last cath in 4/16 with patent native coronaries, probably occluded SVG-RCA. Doubt CAD as cause of his decompensation. No chest pain with minimally elevated HS-TnI. Would not repeat coronary angio at this time.  6. Type 2 diabetes: SSI. 7. OSA: CPAP. 8. Anemia, iron-deficient: received feraheme 02/22/2019 and again on 04/05/19 but had severe reaction to feraheme.Placed on allergy list. 9. Hyponatremia: Sodium 131.  Fluid restrict and follow closely.  10. Hypokalemia - Supp K  11. ID: WBCs trending down, no fever.  UA negative. Bcx NGTD. - follow  12. Deconditioning: Mobilize, PT.     Discharge Vitals: Blood pressure 102/70, pulse 70, temperature 98 F (36.7 C), temperature source Axillary, resp. rate 20, height 5' (1.524 m), weight 80.1 kg, SpO2 100 %.  Labs: Lab Results  Component Value Date   WBC 12.1 (H) 07/01/2019   HGB 9.5 (  L) 07/01/2019   HCT 33.4 (L) 07/01/2019   MCV 76.8 (L) 07/01/2019   PLT 222 07/01/2019    Recent Labs  Lab 07/01/19 0500  NA 131*  K 4.6  CL 91*  CO2 32  BUN 35*  CREATININE 1.06  CALCIUM 8.3*  GLUCOSE 77   Lab Results  Component Value Date   CHOL 118 08/18/2012   HDL 27.20 (L) 08/18/2012   LDLCALC 64 08/18/2012   TRIG 133.0 08/18/2012   BNP (last 3 results) Recent Labs    04/19/19 1316 04/25/19 1107 06/21/19 1752  BNP 195.6* 194.9* 381.5*    ProBNP (last 3 results) No results  for input(s): PROBNP in the last 8760 hours.   Diagnostic Studies/Procedures   No results found.  Discharge Medications   Allergies as of 07/01/2019      Reactions   Feraheme [ferumoxytol] Shortness Of Breath   SOB/Tachycardia   Penicillins Hives, Rash   Has patient had a PCN reaction causing immediate rash, facial/tongue/throat swelling, SOB or lightheadedness with hypotension: Yes Has patient had a PCN reaction causing severe rash involving mucus membranes or skin necrosis: Yes Has patient had a PCN reaction that required hospitalization unsure Has patient had a PCN reaction occurring within the last 10 years: 2010-2012 If all of the above answers are "NO", then may proceed with Cephalosporin use.   Sulfamethoxazole-trimethoprim Other (See Comments)   CDIFF, when taking oral tablets   Tape Other (See Comments), Itching   Skin irritation      Medication List    TAKE these medications   allopurinol 300 MG tablet Commonly known as: ZYLOPRIM Take 300 mg by mouth daily.   ALPRAZolam 0.25 MG tablet Commonly known as: XANAX Take 1 tablet (0.25 mg total) by mouth 2 (two) times daily as needed for anxiety.   amiodarone 200 MG tablet Commonly known as: PACERONE Take 1 tablet (200 mg total) by mouth 2 (two) times daily. Take 1 tablet 2 times a day for 1 week. Then take 1 tablet once daily.   aspirin 81 MG EC tablet Take 1 tablet (81 mg total) by mouth daily.   digoxin 0.125 MG tablet Commonly known as: LANOXIN Take 1 tablet (0.125 mg total) by mouth daily.   gabapentin 300 MG capsule Commonly known as: NEURONTIN Take 300 mg by mouth 3 (three) times daily.   magnesium oxide 400 MG tablet Commonly known as: MAG-OX Take 400 mg by mouth 3 (three) times daily.   methocarbamol 500 MG tablet Commonly known as: ROBAXIN Take 1 tablet (500 mg total) by mouth 3 (three) times daily.   metolazone 2.5 MG tablet Commonly known as: ZAROXOLYN Take 1 tablet by mouth every Monday and  Friday What changed: medication strength   metoprolol succinate 25 MG 24 hr tablet Commonly known as: TOPROL-XL Take 1 tablet (25 mg total) by mouth 2 (two) times daily.   midodrine 10 MG tablet Commonly known as: PROAMATINE Take 1 tablet (10 mg total) by mouth 3 (three) times daily with meals. What changed:   medication strength  how much to take   mupirocin ointment 2 % Commonly known as: BACTROBAN Apply 1 application topically 3 (three) times daily.   oxyCODONE 5 MG immediate release tablet Commonly known as: Oxy IR/ROXICODONE Take 1 tablet (5 mg total) by mouth every 8 (eight) hours as needed for severe pain. What changed: when to take this   potassium chloride SA 20 MEQ tablet Commonly known as: KLOR-CON Take 3 tablets (60 mEq  total) by mouth 2 (two) times daily. What changed: when to take this   PRESCRIPTION MEDICATION See admin instructions. CPAP- At bedtime and during any time of rest   silver sulfADIAZINE 1 % cream Commonly known as: SILVADENE Apply 1 application topically daily as needed (for wound care- both legs).   spironolactone 25 MG tablet Commonly known as: ALDACTONE Take 1 tablet (25 mg total) by mouth daily.   torsemide 20 MG tablet Commonly known as: DEMADEX Take 4 tablets (80 mg total) by mouth 2 (two) times daily.   traZODone 100 MG tablet Commonly known as: DESYREL Take 100 mg by mouth at bedtime.   warfarin 5 MG tablet Commonly known as: COUMADIN Take as directed. If you are unsure how to take this medication, talk to your nurse or doctor. Original instructions: Take 1 tablet (5 mg total) by mouth daily at 6 PM. What changed:   how much to take  how to take this  when to take this  additional instructions  Another medication with the same name was removed. Continue taking this medication, and follow the directions you see here.            Durable Medical Equipment  (From admission, onward)         Start     Ordered    06/30/19 1528  Heart failure home health orders  (Heart failure home health orders / Face to face)  Once    Comments: Heart Failure Follow-up Care:  Verify follow-up appointments per Patient Discharge Instructions. Confirm transportation arranged. Reconcile home medications with discharge medication list. Remove discontinued medications from use. Assist patient/caregiver to manage medications using pill box. Reinforce low sodium food selection Assessments: Vital signs and oxygen saturation at each visit. Assess home environment for safety concerns, caregiver support and availability of low-sodium foods. Consult Education officer, museum, PT/OT, Dietitian, and CNA based on assessments. Perform comprehensive cardiopulmonary assessment. Notify MD for any change in condition or weight gain of 3 pounds in one day or 5 pounds in one week with symptoms. Daily Weights and Symptom Monitoring: Ensure patient has access to scales. Teach patient/caregiver to weigh daily before breakfast and after voiding using same scale and record.    Teach patient/caregiver to track weight and symptoms and when to notify Provider. Activity: Develop individualized activity plan with patient/caregiver.   Question Answer Comment  Heart Failure Follow-up Care [facility] ([facility]) [facility] at [phone]   Lab frequency Other see comments   Fax lab results to AHF Clinic at [phone]   Diet Low Sodium Heart Healthy   Fluid restrictions: 1800 mL Fluid      06/30/19 1527          Disposition   The patient will be discharged in stable condition to home.  Follow-up Information    Baldwin Jamaica, PA-C Follow up on 07/07/2019.   Specialty: Cardiology Why: at 1:45PM  Contact information: [address] [place] [address] [phone]        Evans Lance, MD Follow up on 09/27/2019.   Specialty: Cardiology Why: at 11:30AM Contact information: [address] [place]  [address] [phone]        Larey Dresser, MD Follow up on 07/11/2019.   Specialty: Cardiology Why: [place] information: [address] [place] [address] [phone]        [name], MD.   Specialty: Internal Medicine Why: Pt. Will  make follow up             Duration of Discharge Encounter: Greater than 35 minutes   Signed, Avira Tillison  07/01/2019, 12:42 PM

## 2019-06-30 NOTE — Progress Notes (Signed)
Nutrition Brief Note  Patient identified as a LOS day 9.  Wt Readings from Last 15 Encounters:  06/30/19 81.4 kg  06/02/19 77 kg  05/12/19 79.7 kg  04/25/19 82.2 kg  04/20/19 79.7 kg  04/19/19 81.7 kg  04/11/19 75.1 kg  02/26/19 90 kg  02/04/19 92.5 kg  01/25/19 91.3 kg  01/20/19 91.6 kg  11/24/18 90.5 kg  07/14/18 90.7 kg  06/18/18 91.6 kg  06/01/18 91.2 kg    Body mass index is 35.05 kg/m. Patient meets criteria for obesity class II based on current BMI.   Current diet order is Heart Healthy, patient is consuming approximately 100% of meals at this time. Labs and medications reviewed.   No nutrition interventions warranted at this time. If nutrition issues arise, please consult RD.    Gaynell Face, MS, RD, LDN Inpatient Clinical Dietitian Pager: [phone] Weekend/After Hours: [phone]

## 2019-07-01 ENCOUNTER — Encounter (HOSPITAL_COMMUNITY): Payer: Medicare HMO

## 2019-07-01 DIAGNOSIS — I5023 Acute on chronic systolic (congestive) heart failure: Secondary | ICD-10-CM | POA: Diagnosis not present

## 2019-07-01 LAB — CBC
HCT: 33.4 % — ABNORMAL LOW (ref 39.0–52.0)
Hemoglobin: 9.5 g/dL — ABNORMAL LOW (ref 13.0–17.0)
MCH: 21.8 pg — ABNORMAL LOW (ref 26.0–34.0)
MCHC: 28.4 g/dL — ABNORMAL LOW (ref 30.0–36.0)
MCV: 76.8 fL — ABNORMAL LOW (ref 80.0–100.0)
Platelets: 222 10*3/uL (ref 150–400)
RBC: 4.35 MIL/uL (ref 4.22–5.81)
RDW: 20.3 % — ABNORMAL HIGH (ref 11.5–15.5)
WBC: 12.1 10*3/uL — ABNORMAL HIGH (ref 4.0–10.5)
nRBC: 0 % (ref 0.0–0.2)

## 2019-07-01 LAB — GLUCOSE, CAPILLARY
Glucose-Capillary: 54 mg/dL — ABNORMAL LOW (ref 70–99)
Glucose-Capillary: 78 mg/dL (ref 70–99)

## 2019-07-01 LAB — BASIC METABOLIC PANEL
Anion gap: 8 (ref 5–15)
BUN: 35 mg/dL — ABNORMAL HIGH (ref 6–20)
CO2: 32 mmol/L (ref 22–32)
Calcium: 8.3 mg/dL — ABNORMAL LOW (ref 8.9–10.3)
Chloride: 91 mmol/L — ABNORMAL LOW (ref 98–111)
Creatinine, Ser: 1.06 mg/dL (ref 0.61–1.24)
GFR calc Af Amer: >60 mL/min (ref >60)
GFR calc non Af Amer: >60 mL/min (ref >60)
Glucose, Bld: 77 mg/dL (ref 70–99)
Potassium: 4.6 mmol/L (ref 3.5–5.1)
Sodium: 131 mmol/L — ABNORMAL LOW (ref 135–145)

## 2019-07-01 LAB — PROTIME-INR
INR: 1.7 — ABNORMAL HIGH (ref 0.8–1.2)
Prothrombin Time: 19.9 seconds — ABNORMAL HIGH (ref 11.4–15.2)

## 2019-07-01 MED ORDER — MIDODRINE HCL 10 MG PO TABS: 10.0000 mg | ORAL_TABLET | Freq: Three times a day (TID) | ORAL | 3 refills | Status: AC

## 2019-07-01 MED ORDER — WARFARIN SODIUM 5 MG PO TABS: 5.0000 mg | ORAL_TABLET | Freq: Every day | ORAL | 3 refills | Status: AC

## 2019-07-01 MED ORDER — ALPRAZOLAM 0.25 MG PO TABS: 0.2500 mg | ORAL_TABLET | Freq: Two times a day (BID) | ORAL | 0 refills | Status: AC | PRN

## 2019-07-01 MED ORDER — POTASSIUM CHLORIDE CRYS ER 20 MEQ PO TBCR: 60.0000 meq | EXTENDED_RELEASE_TABLET | Freq: Two times a day (BID) | ORAL | 3 refills | Status: AC

## 2019-07-01 MED ORDER — WARFARIN SODIUM 5 MG PO TABS: 5.0000 mg | ORAL_TABLET | Freq: Every day | ORAL | Status: DC

## 2019-07-01 MED ORDER — DIGOXIN 125 MCG PO TABS: 0.1250 mg | ORAL_TABLET | Freq: Every day | ORAL | 3 refills | Status: AC

## 2019-07-01 MED ORDER — AMIODARONE HCL 200 MG PO TABS: 200.0000 mg | ORAL_TABLET | Freq: Two times a day (BID) | ORAL | 1 refills | Status: AC

## 2019-07-01 MED ORDER — METOLAZONE 2.5 MG PO TABS: ORAL_TABLET | ORAL | 3 refills | Status: DC

## 2019-07-01 MED FILL — MIDODRINE HCL 10 MG TABLET: 10 | 30 days supply | Qty: 90 | Fill #0

## 2019-07-01 MED FILL — POTASSIUM CL ER 20 MEQ TABL: 20 | 30 days supply | Qty: 180 | Fill #0

## 2019-07-01 MED FILL — metOLazone 2.5 MG TABS: 2.5 | 30 days supply | Qty: 30 | Fill #0

## 2019-07-01 MED FILL — AMIODARONE HCL 200 MG TAB: 200 | 30 days supply | Qty: 60 | Fill #0

## 2019-07-01 MED FILL — WARFARIN SODIUM 5 MG TABLET: 5 | 30 days supply | Qty: 30 | Fill #0

## 2019-07-01 MED FILL — DIGOXIN 0.125 MG TABLET: 125 | 30 days supply | Qty: 30 | Fill #0

## 2019-07-01 NOTE — Care Management Important Message (Signed)
Important Message  Patient Details  Name: Lance Cook MRN: [id number] Date of Birth: 1961/01/27   Medicare Important Message Given:  Yes     Memory Argue 07/01/2019, 1:16 PM

## 2019-07-01 NOTE — Progress Notes (Signed)
Lance Cook for warfarin Indication: Mechanical AVR Lance Cook 1988), mech Mitral valve 5/16, Afib (CHADS2VASc = 4)  Allergies  Allergen Reactions  . Feraheme [Ferumoxytol] Shortness Of Breath    SOB/Tachycardia  . Penicillins Hives and Rash    Has patient had a PCN reaction causing immediate rash, facial/tongue/throat swelling, SOB or lightheadedness with hypotension: Yes Has patient had a PCN reaction causing severe rash involving mucus membranes or skin necrosis: Yes Has patient had a PCN reaction that required hospitalization unsure Has patient had a PCN reaction occurring within the last 10 years: 2010-2012 If all of the above answers are "NO", then may proceed with Cephalosporin use.  Lance Cook Other (See Comments)    CDIFF, when taking oral tablets   . Tape Other (See Comments) and Itching    Skin irritation    Patient Measurements: Height: 5' (152.4 cm) Weight: 176 lb 8 oz (80.1 kg) IBW/kg (Calculated) : 50 Heparin dosing weight: 69 kg  Vital Signs: Temp: 98 F (36.7 C) (10/29 2323) Temp Source: Axillary (10/29 2323) BP: 102/70 (10/30 0725) Pulse Rate: 70 (10/30 0725)  Labs: Recent Labs    06/29/19 0409 06/30/19 0415 07/01/19 0500  HGB 9.5* 9.4* 9.5*  HCT 31.9* 32.3* 33.4*  PLT 188 208 222  LABPROT 23.5* 20.8* 19.9*  INR 2.1* 1.8* 1.7*  CREATININE 1.25* 1.26* 1.06    Estimated Creatinine Clearance: 67.4 mL/min (by C-G formula based on SCr of 1.06 mg/dL).   Assessment: 58 y.o. 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   Home dose is 5mg  daily, last taken 10/19. INR fluctuating outpatient - was therapeutic on 10/13 at 2.6 but INR 1.2 on 10/6 and 2.6 on 9/29.   INR up 4.9 10/23, now 1.7 (held x 2d, and resumed 10/24). Started on IV amiodarone 10/21 for VT, transitioned to PO 10/27, will need to watch INR closely.    ICD implant and AV ablation done 10/26. No further heparin post device per EP. Note goal INR <3 for the next week (through 11/2) while pacemaker pocket healing.  Goal of Therapy:  INR 2.5 - 3.5  Monitor platelets by anticoagulation protocol: Yes   Plan:  Warfarin 5 mg daily until next INR check  Plan to recheck INR on Monday Monitor for bleeding   [name], PharmD PGY2 Cardiology Pharmacy Resident Phone [phone] 07/01/2019       8:13 AM  Please check [email or web address] for unit-specific pharmacist phone numbers

## 2019-07-01 NOTE — Progress Notes (Signed)
Inpatient Diabetes Program Recommendations  AACE/ADA: New Consensus Statement on Inpatient Glycemic Control (2015)  Target Ranges:  Prepandial:   less than 140 mg/dL      Peak postprandial:   less than 180 mg/dL (1-2 hours)      Critically ill patients:  140 - 180 mg/dL   Lab Results  Component Value Date   GLUCAP 78 07/01/2019   HGBA1C 5.2 02/14/2019    Review of Glycemic Control  Hypoglycemia this am of 54 mg/dL. Blood sugars have been running on the low side.  Inpatient Diabetes Program Recommendations:     Decrease Novolog to 0-9 units tidwc.  Will continue to follow.  Thank you. Lorenda Peck, RD, LDN, CDE Inpatient Diabetes Coordinator [phone]

## 2019-07-01 NOTE — Plan of Care (Signed)

## 2019-07-01 NOTE — Progress Notes (Signed)
Discharged home , voucher given for taxi. Belongings taken home.

## 2019-07-01 NOTE — Progress Notes (Addendum)
P Patient ID: Lance Cook, male   DOB: 1961/07/25, 58 y.o.   MRN: [id number]     Advanced Heart Failure Rounding Note  PCP-Cardiologist: [name], MD   Subjective:    [facility] BiV ICD placed this admit but patient had diaphragmatic stimulation, LV lead turned off.    He is now back on po diuretics, doing well this morning.  Has been walking a lot.  Feels better.    Objective:   Weight Range: 80.1 kg Body mass index is 34.47 kg/m.   Vital Signs:   Temp:  [97.8 F (36.6 C)-98.2 F (36.8 C)] 98 F (36.7 C) (10/29 2323) Pulse Rate:  [58-70] 70 (10/30 0725) Resp:  [16-20] 20 (10/30 0725) BP: (92-102)/(55-72) 102/70 (10/30 0725) SpO2:  [97 %-100 %] 100 % (10/30 0725) Weight:  [80.1 kg] 80.1 kg (10/30 0500) Last BM Date: 06/29/19  Weight change: Filed Weights   06/29/19 0540 06/30/19 0258 07/01/19 0500  Weight: 81.6 kg 81.4 kg 80.1 kg    Intake/Output:   Intake/Output Summary (Last 24 hours) at 07/01/2019 0819 Last data filed at 07/01/2019 0500 Gross per 24 hour  Intake 1140 ml  Output 3800 ml  Net -2660 ml      Physical Exam   General: NAD Neck: JVP 8 cm, no thyromegaly or thyroid nodule.  Lungs: Clear to auscultation bilaterally with normal respiratory effort. CV: Nondisplaced PMI.  Heart irregular with mechanical S1/S2, no S3/S4, 2/6 SEM RUSB.  1+ ankle edema.   Abdomen: Soft, nontender, no hepatosplenomegaly, no distention.  Skin: Intact without lesions or rashes.  Neurologic: Alert and oriented x 3.  Psych: Normal affect. Extremities: No clubbing or cyanosis.  HEENT: Normal.   Telemetry   A fib 70s personally reviewed.   Labs    CBC Recent Labs    06/30/19 0415 07/01/19 0500  WBC 12.8* 12.1*  HGB 9.4* 9.5*  HCT 32.3* 33.4*  MCV 75.8* 76.8*  PLT 208 [id number]   Basic Metabolic Panel Recent Labs    06/30/19 0415 07/01/19 0500  NA 129* 131*  K 3.8 4.6  CL 88* 91*  CO2 32 32  GLUCOSE 78 77  BUN 38* 35*  CREATININE 1.26* 1.06   CALCIUM 7.9* 8.3*   Liver Function Tests No results for input(s): AST, ALT, ALKPHOS, BILITOT, PROT, ALBUMIN in the last 72 hours. No results for input(s): LIPASE, AMYLASE in the last 72 hours. Cardiac Enzymes No results for input(s): CKTOTAL, CKMB, CKMBINDEX, TROPONINI in the last 72 hours.  BNP: BNP (last 3 results) Recent Labs    04/19/19 1316 04/25/19 1107 06/21/19 1752  BNP 195.6* 194.9* 381.5*    ProBNP (last 3 results) No results for input(s): PROBNP in the last 8760 hours.   D-Dimer No results for input(s): DDIMER in the last 72 hours. Hemoglobin A1C No results for input(s): HGBA1C in the last 72 hours. Fasting Lipid Panel No results for input(s): CHOL, HDL, LDLCALC, TRIG, CHOLHDL, LDLDIRECT in the last 72 hours. Thyroid Function Tests No results for input(s): TSH, T4TOTAL, T3FREE, THYROIDAB in the last 72 hours.  Invalid input(s): FREET3  Other results:   Imaging    No results found.   Medications:     Scheduled Medications: . allopurinol  300 mg Oral Daily  . amiodarone  200 mg Oral BID  . Chlorhexidine Gluconate Cloth  6 each Topical Daily  . digoxin  0.125 mg Oral Daily  . gabapentin  300 mg Oral TID  . Gerhardt's butt cream  Topical TID  . insulin aspart  0-15 Units Subcutaneous TID WC  . insulin aspart  0-5 Units Subcutaneous QHS  . magnesium oxide  400 mg Oral TID  . metoprolol succinate  12.5 mg Oral BID  . midodrine  10 mg Oral TID WC  . mupirocin ointment  1 application Topical TID  . potassium chloride  40 mEq Oral TID  . sodium chloride flush  10-40 mL Intracatheter Q12H  . sodium chloride flush  3 mL Intravenous Once  . spironolactone  25 mg Oral Daily  . torsemide  80 mg Oral BID  . traZODone  100 mg Oral QHS  . Warfarin - Pharmacist Dosing Inpatient   Does not apply q1800    Infusions: . sodium chloride    . sodium chloride    . sodium chloride Stopped (06/28/19 1038)    PRN Medications: sodium chloride, acetaminophen,  ALPRAZolam, nitroGLYCERIN, ondansetron (ZOFRAN) IV, oxyCODONE, phenol, sodium chloride flush, zolpidem   Assessment/Plan   1. 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  He has permanent atrial fibrillation. Dysfunction of his mechanical valves does not appear to explain his worsening CHF this year. 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 [place] 04/07/19 showed mildly elevated filling pressures.He was doing well until earlier this month, then has developed progressive volume overload and was admitted.  Echo done this admission after VT arrest with DCCV x 3 showed EF 30-35% with global HK, normal RV size and systolic function, mechanical MV mean gradient 5.7 and mild MR, mechanical AoV mean gradient 12, dilated IVC.  It is possible fall in EF is related to stunning from VT arrest. HS-TnI was not significantly elevated.  He is s/p Chemical engineer CRT-D placement on 10/26, LV lead is off.  Of note, PYP scan showed no evidence for ATTR amyloidosis. Valves sounds remain crisp on exam and gradients stable on echo.  He is now back on po diuretics.  - Continue torsemide 80 mg bid.  -Continue spironolactone at 25 mg daily. - He is on midodrine 10 mg tid currently to maintain BP, will continue for now.  - Continue digoxin 0.125 daily, level ok.  - He remains on Toprol XL 12.5 bid.  - Discussed with Chanetta Marshall, LV lead off due to diaphragmatic stimulation and will try different vectors for LV pacing when he is seen in office followup.  2. Valvular heart disease: Patient has mechanical mitral and aortic valves. TEE in 8/20 showed mechanical MV with mean gradient 5 mmHg, mild peri-valvular regurgitation. The aortic valve appeared to open reasonably well but mean gradient was  23 mmHg, ?component of mild patient-prosthesis mismatch. Echo this admission showed stable gradients (lower for aortic valve).  - Continue ASA 81 and warfarin with INR goal 2.5-3.5 => INR 1.7 this morning, no heparin bridge with recent device. 3. Atrial fibrillation: Chronic. Long-standing atrial fibrillation with severely dilated atria, unlikely to have a successful cardioversion.  -Continue Toprol XL at current dose.  4. VT: Patient with VT this admission, rate 200s, monomorphic.  He has not had episodes of palpitations or dizziness at home prior to admission.  He did not lose consciousness or require CPR.  Cardioversion x 3 failed, he converted back to atrial fibrillation after amiodarone boluses and remains in atrial fibrillation currently.  No chest pain and  HS-TnI not significantly elevated, think ACS is unlikely. He was hypokalemic.  Cath in 4/16 with no significant disease. No further VT overnight.  Now with [facility] [employer] CRT-D device.  - Continue amiodarone 200 mg bid.  - Keep K> 4.0 Mg > 2.0  5. CAD: Last cath in 4/16 with patent native coronaries, probably occluded SVG-RCA. Doubt CAD as cause of his decompensation. No chest pain with minimally elevated HS-TnI. Would not repeat coronary angio at this time.  6. Type 2 diabetes: SSI.  7. OSA: CPAP. 8. Anemia, iron-deficient: received feraheme 02/22/2019 and again on 04/05/19 but had severe reaction to feraheme.Placed on allergy list. 9. Hyponatremia: Sodium 131.  Fluid restrict and follow closely.  10. Hypokalemia - Supp K  11. ID: WBCs trending down, no fever.  UA negative. Bcx NGTD. - follow  12. Deconditioning: Mobilize, PT.    I think he can go home today.  Will need home health.  Will need close followup in CHF clinic and close INR followup.  No Lovenox bridging of subtherapeutic INR per EP due to device placement.  Cardiac meds for home: KCl 60 bid, warfarin goal INR 2.5-3 for the next week then 2.5-3.5, ASA 81,  spironolactone 25 daily, torsemide 80 bid, metolazone 2.5 mg q Monday/Friday, Toprol XL 25 mg bid, midodrine 10 mg tid, amiodarone 200 mg bid x 1 week then 200 daily.    Length of Stay: [place], MD  07/01/2019, 8:19 AM  Advanced Heart Failure Team Pager [phone] (M-F; 7a - 4p)  Please contact [facility] for night-coverage after hours (4p -7a ) and weekends on [email or web address]

## 2019-07-01 NOTE — Discharge Instructions (Addendum)
Supplemental Discharge Instructions for  Pacemaker/Defibrillator Patients  Activity No heavy lifting or vigorous activity with your left/right arm for 6 to 8 weeks.  Do not raise your left/right arm above your head for one week.  Gradually raise your affected arm as drawn below.           __        10/30                    10/31                           11/1                      11/2  NO DRIVING for  6 months      WOUND CARE - Keep the wound area clean and dry.  Do not get this area wet for one week. No showers for one week; you may shower on    07/04/19 . - The tape/steri-strips on your wound will fall off; do not pull them off.  No bandage is needed on the site.  DO  NOT apply any creams, oils, or ointments to the wound area. - If you notice any drainage or discharge from the wound, any swelling or bruising at the site, or you develop a fever > 101? F after you are discharged home, call the office at once.  Special Instructions - You are still able to use cellular telephones; use the ear opposite the side where you have your pacemaker/defibrillator.  Avoid carrying your cellular phone near your device. - When traveling through airports, show security personnel your identification card to avoid being screened in the metal detectors.  Ask the security personnel to use the hand wand. - Avoid arc welding equipment, MRI testing (magnetic resonance imaging), TENS units (transcutaneous nerve stimulators).  Call the office for questions about other devices. - Avoid electrical appliances that are in poor condition or are not properly grounded. - Microwave ovens are safe to be near or to operate.  Additional information for defibrillator patients should your device go off: - If your device goes off ONCE and you feel fine afterward, notify the device clinic nurses. - If your device goes off ONCE and you do not feel well afterward, call 911. - If your device goes off TWICE, call 911. - If  your device goes off THREE times in one day, call 911.  DO NOT DRIVE YOURSELF OR A FAMILY MEMBER WITH A DEFIBRILLATOR TO THE HOSPITAL--CALL 911.   Information on my medicine - Coumadin   (Warfarin)  This medication education was reviewed with me or my healthcare representative as part of my discharge preparation.  The pharmacist that spoke with me during my hospital stay was:  Ronna Polio, [facility]  Why was Coumadin prescribed for you? Coumadin was prescribed for you because you have a blood clot or a medical condition that can cause an increased risk of forming blood clots. Blood clots can cause serious health problems by blocking the flow of blood to the heart, lung, or brain. Coumadin can prevent harmful blood clots from forming. As a reminder your indication for Coumadin is:   Blood Clot Prevention After Heart Valve Surgery  What test will check on my response to Coumadin? While on Coumadin (warfarin) you will need to have an INR test regularly to ensure that your dose is keeping you  in the desired range. The INR (international normalized ratio) number is calculated from the result of the laboratory test called prothrombin time (PT).  If an INR APPOINTMENT HAS NOT ALREADY BEEN MADE FOR YOU please schedule an appointment to have this lab work done by your health care provider within 7 days. Your INR goal is 2.5-3.5.  What  do you need to  know  About  COUMADIN? Take Coumadin (warfarin) exactly as prescribed by your healthcare provider about the same time each day.  DO NOT stop taking without talking to the doctor who prescribed the medication.  Stopping without other blood clot prevention medication to take the place of Coumadin may increase your risk of developing a new clot or stroke.  Get refills before you run out.  What do you do if you miss a dose? If you miss a dose, take it as soon as you remember on the same day then continue your regularly scheduled regimen the next day.  Do not  take two doses of Coumadin at the same time.  Important Safety Information A possible side effect of Coumadin (Warfarin) is an increased risk of bleeding. You should call your healthcare provider right away if you experience any of the following: ? Bleeding from an injury or your nose that does not stop. ? Unusual colored urine (red or dark brown) or unusual colored stools (red or black). ? Unusual bruising for unknown reasons. ? A serious fall or if you hit your head (even if there is no bleeding).  Some foods or medicines interact with Coumadin (warfarin) and might alter your response to warfarin. To help avoid this: ? Eat a balanced diet, maintaining a consistent amount of Vitamin K. ? Notify your provider about major diet changes you plan to make. ? Avoid alcohol or limit your intake to 1 drink for women and 2 drinks for men per day. (1 drink is 5 oz. wine, 12 oz. beer, or 1.5 oz. liquor.)  Make sure that ANY health care provider who prescribes medication for you knows that you are taking Coumadin (warfarin).  Also make sure the healthcare provider who is monitoring your Coumadin knows when you have started a new medication including herbals and non-prescription products.  Coumadin (Warfarin)  Major Drug Interactions  Increased Warfarin Effect Decreased Warfarin Effect  Alcohol (large quantities) Antibiotics (esp. Septra/Bactrim, Flagyl, Cipro) Amiodarone (Cordarone) Aspirin (ASA) Cimetidine (Tagamet) Megestrol (Megace) NSAIDs (ibuprofen, naproxen, etc.) Piroxicam (Feldene) Propafenone (Rythmol SR) Propranolol (Inderal) Isoniazid (INH) Posaconazole (Noxafil) Barbiturates (Phenobarbital) Carbamazepine (Tegretol) Chlordiazepoxide (Librium) Cholestyramine (Questran) Griseofulvin Oral Contraceptives Rifampin Sucralfate (Carafate) Vitamin K   Coumadin (Warfarin) Major Herbal Interactions  Increased Warfarin Effect Decreased Warfarin Effect  Garlic Ginseng Ginkgo biloba  Coenzyme Q10 Green tea St. Johns wort    Coumadin (Warfarin) FOOD Interactions  Eat a consistent number of servings per week of foods HIGH in Vitamin K (1 serving =  cup)  Collards (cooked, or boiled & drained) Kale (cooked, or boiled & drained) Mustard greens (cooked, or boiled & drained) Parsley *serving size only =  cup Spinach (cooked, or boiled & drained) Swiss chard (cooked, or boiled & drained) Turnip greens (cooked, or boiled & drained)  Eat a consistent number of servings per week of foods MEDIUM-HIGH in Vitamin K (1 serving = 1 cup)  Asparagus (cooked, or boiled & drained) Broccoli (cooked, boiled & drained, or raw & chopped) Brussel sprouts (cooked, or boiled & drained) *serving size only =  cup Lettuce, raw (green leaf, endive,  romaine) Spinach, raw Turnip greens, raw & chopped   These websites have more information on Coumadin (warfarin):  FailFactory.se; VeganReport.com.au;

## 2019-07-01 NOTE — Progress Notes (Signed)
Hypoglycemic Event  CBG: 54  Treatment: 4 Oz orange juice  Symptoms: none  Follow-up CBG: Time:0712 CBG Result:78  Possible Reasons for Event: Poor Po intake  Comments/MD notified:     Marcie Mowers

## 2019-07-01 NOTE — TOC Transition Note (Signed)
Transition of Care [facility]) - CM/SW Discharge Note   Patient Details  Name: Master Mckee MRN: [id number] Date of Birth: 14-Oct-1960  Transition of Care [facility]) CM/SW Contact:  Zenon Mayo, RN Phone Number: 07/01/2019, 10:04 AM   Clinical Narrative:    Patient is for dc to home today, he is active with [facility] for HHRN, HHPT, will resume services at dc.  NCM notified Butch Penny with [facility] of patient's dc today.  Patient states he will need a cab voucher.  NCM assisted patient with voucher.  Also TOC will be delivering meds to patient room prior to dc.  He has a scale at home, he weighs himself daily,  he has a pill box.  No other needs.   Final next level of care: [place] Barriers to Discharge: No Barriers Identified   Patient Goals and CMS Choice Patient states their goals for this hospitalization and ongoing recovery are:: go home CMS Medicare.gov Compare Post Acute Care list provided to:: Patient Choice offered to / list presented to : Patient  Discharge Placement                       Discharge Plan and Services In-house Referral: NA Discharge Planning Services: CM Consult Post Acute Care Choice: Durable Medical Equipment(has cpap machine, walker)          DME Arranged: (NA)         HH Arranged: RN, Disease Management, PT, OT HH Agency: [place] ([place]) Date [place]: 07/01/19 Time [place]: 1004 Representative spoke with at [place]: [place] ([place]) Interventions     Readmission Risk Interventions Readmission Risk Prevention Plan 06/30/2019  Transportation Screening Complete  Medication Review Press photographer) Complete  PCP or Specialist appointment within 3-5 days of discharge Complete  HRI or [place] Complete  SW Recovery Care/Counseling Consult Complete  [place] Not Applicable  Some recent data might  be hidden

## 2019-07-05 ENCOUNTER — Ambulatory Visit (INDEPENDENT_AMBULATORY_CARE_PROVIDER_SITE_OTHER): Payer: Medicare HMO | Admitting: Pharmacist

## 2019-07-05 DIAGNOSIS — I482 Chronic atrial fibrillation, unspecified: Secondary | ICD-10-CM | POA: Diagnosis not present

## 2019-07-05 DIAGNOSIS — Z5181 Encounter for therapeutic drug level monitoring: Secondary | ICD-10-CM

## 2019-07-05 DIAGNOSIS — Z954 Presence of other heart-valve replacement: Secondary | ICD-10-CM

## 2019-07-05 LAB — POCT INR: INR: 3.6 — AB (ref 2.0–3.0)

## 2019-07-06 NOTE — Progress Notes (Signed)
Cardiology Office Note Date:  07/07/2019  Patient ID:  Lance Cook, DOB 08/26/1961, MRN [id number] PCP:  [name], MD  Cardiologist:  Dr. [name] AHF: Dr. [name] Electrophysiologist: Dr. [name]    Chief Complaint: wound check visit  History of Present Illness: Lance Cook is a 58 y.o. 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 [name], of late with worsening CHF picture having a number of hospitalizations this year.  TEEin Aug also noted question of possible LAA thrombus, remains on ASA and warfarin.  He was last seen in HF clinic 06/02/2019, note mentions suspect restrictive CM as etiology of his worsening HF with stable VHD/prosthetics, preserved LVEF and RVEF, and doubt any significant change in his CAD with known occl SVG  Admitted to [facility] 06/21/2019 again with worsening HF symptoms, increasing SOB and edema, acute/chronic CHF exacerbation Lasix gtt started, BP meds held with lower BP readings 06/22/2019 morning he developed WCT unstable with hypotension, and shocked (x3).  Dr. [name] felt this was VT, not AFib w/BBB and started on amiodarone gtt.  TTE noted reduction in LVEF 30-36%, with global HK, normal RV size and systolic function, mechanical MV mean gradient 5.7 and mild MR, mechanical AoV mean gradient 12, dilated IVC.  He was not felt to be ischemic or have progression/development of new significant CAD  He underwent CRT-D implant 06/27/2019.  Overnight unfortunately developed diaphragmatic stim, Dr. Tanna Furry note mentions suspect lead moved slightly, LV lead was turned off.  Discussed perhaps consideration for left bundle pacing in the future, did not pursue lead revision during his stay.  He was discharged 06/30/2019.  He is accompanied by his wife today. He feels  like he is doing pretty well.  He gets winded with ambulation but says he recovers well/quickly with rest.  Thinks this as at where he was on discharge.  He has been sleeping in a recliner for quite some time. No CP, no dizzy spells, near syncope or syncope.  No shocks. No bleeding or signs of bleeding He has not had concerns regarding his site.  He is markedly edematous, they r3port he has gained only a couple pounds, and that his weight at home this AM was 178.8 (176.2 at discharge) He has a visitng RN, wraps his legs (not currently wrapped) He does not feel like he has declined at all since his discharge  He sees the HF team next week Had INRs done at the coumadin clinic, last was 3.6 on 07/05/2019.  Device information BSCi CRT-D, implanted 06/27/2019, secondary prevention   Past Medical History:  Diagnosis Date  . Anemia   . Anxiety   . ARDS (adult respiratory distress syndrome) ([place]) 01/27/2015  . Arthritis   . Ascending aortic dissection ([place]) 07/14/2008   Localized dissection of ascending aorta noted on CTA in 2009 and stable on CTA in 2011  . Atrial fibrillation (HCC)    chronic persistent  . Bell's palsy   . C. difficile diarrhea   . CAD (coronary artery disease)    Old scar inferior wall myoview, 10/2009 EF 52%.  He did have previous SVG to RCA but no obstructive disease noted on his most recent cath.  SVG occluded.   . Cellulitis 04/02/2014  . Cerebral artery occlusion with cerebral infarction ([place]) 10/08/2011  Overview:  Overview:  And hx of TIA prior to CABG, all thought due to systemic emboli prior to coumadin   . Chronic diastolic heart failure ([place]) 03/01/2015   a. 12/2015: echo showing a preserved EF of 65-70%, moderate AS, and moderate TR.   Marland Kitchen CVA (cerebral vascular accident) ([place]) 10/08/2011   And hx of TIA prior to CABG, all thought due to systemic emboli prior to coumadin   . DIABETES MELLITUS, TYPE II 11/01/2009   Qualifier: Diagnosis of  By: [name] MD, [name]     . Diverticulosis   . 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 Gastric polyps   . GERD (gastroesophageal reflux disease)    not needing medication at thhis time- 01/22/15  . Gout   . Hiatal hernia   . History of colon polyps 12/23/2011   Overview:  Overview:  Colonoscopy January 2010, 6 mm rectal tubulovillous adenoma. No high-grade dysplasia   . Hyperlipidemia   . Hypertension   . Hypogonadism male 04/08/2012  . Impaired glucose tolerance 10/08/2011  . Morbid obesity ([place]) 04/02/2014  . OSA (obstructive sleep apnea)    CPAP  . Peripheral vascular disease ([place])   . 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 [id number]   33 mm St Jude bileaflet mechanical valve placed via right mini thoracotomy approach  . 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    1988 - Dr Blase Mess at [facility] in [place], [place]  . Testicular cancer [name])    He was 58 y/o. He had surgical resection and rad tx's.   Marland Kitchen TIA (transient ischemic attack)    age 71  . Tubulovillous adenoma of colon   . 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 Venous (peripheral) insufficiency 02/03/2005    Past Surgical History:  Procedure Laterality Date  . aortic truck    . BENTALL PROCEDURE  1988   25 mm St Jude mechanical valve conduit - Dr Blase Mess at [facility] in [place], [place]  . BIOPSY  06/01/2018   Procedure: BIOPSY;  Surgeon: Jerene Bears, MD;  Location: [facility];  Service: Gastroenterology;;  . BIV ICD INSERTION CRT-D N/A 06/27/2019   Procedure: BIV ICD INSERTION CRT-D;  Surgeon: Evans Lance, MD;  Location: [place] CV LAB;  Service: Cardiovascular;  Laterality: N/A;  . BUBBLE STUDY  04/04/2019   Procedure: BUBBLE STUDY;  Surgeon: Larey Dresser, MD;  Location: [facility] ENDOSCOPY;   Service: Cardiovascular;;  . CARDIAC VALVE REPLACEMENT     [phone]  . CHOLECYSTECTOMY  2011  . COLONOSCOPY WITH PROPOFOL N/A 06/17/2016   Procedure: COLONOSCOPY WITH PROPOFOL;  Surgeon: Jerene Bears, MD;  Location: [facility] ENDOSCOPY;  Service: Gastroenterology;  Laterality: N/A;  . COLONOSCOPY WITH PROPOFOL N/A 06/01/2018   Procedure: COLONOSCOPY WITH PROPOFOL;  Surgeon: Jerene Bears, MD;  Location: [facility] ENDOSCOPY;  Service: Gastroenterology;  Laterality: N/A;  . CORONARY ARTERY BYPASS GRAFT    . LASER ABLATION Left 03/06/2010   leg  . MITRAL VALVE REPLACEMENT Right 01/24/2015   Procedure: Re-Operation, MINIMALLY INVASIVE MITRAL VALVE (MV) REPLACEMENT.;  Surgeon: Rexene Alberts, MD;  Location: [place];  Service: Open Heart Surgery;  Laterality: Right;  . ORCHIECTOMY  age 26   testicular cancer  . OTOPLASTY     bilateral, age 56  .  POLYPECTOMY  06/01/2018   Procedure: POLYPECTOMY;  Surgeon: Jerene Bears, MD;  Location: [facility];  Service: Gastroenterology;;  . RIGHT HEART CATH N/A 04/07/2019   Procedure: RIGHT HEART CATH;  Surgeon: Larey Dresser, MD;  Location: [place] CV LAB;  Service: Cardiovascular;  Laterality: N/A;  . TEE WITHOUT CARDIOVERSION N/A 10/11/2014   Procedure: TRANSESOPHAGEAL ECHOCARDIOGRAM (TEE);  Surgeon: Pixie Casino, MD;  Location: [facility] ENDOSCOPY;  Service: Cardiovascular;  Laterality: N/A;  . TEE WITHOUT CARDIOVERSION N/A 01/24/2015   Procedure: TRANSESOPHAGEAL ECHOCARDIOGRAM (TEE);  Surgeon: Rexene Alberts, MD;  Location: [facility];  Service: Open Heart Surgery;  Laterality: N/A;  . TEE WITHOUT CARDIOVERSION N/A 04/04/2019   Procedure: TRANSESOPHAGEAL ECHOCARDIOGRAM (TEE);  Surgeon: Larey Dresser, MD;  Location: [facility] ENDOSCOPY;  Service: Cardiovascular;  Laterality: N/A;  . TONSILLECTOMY  1967  . TOOTH EXTRACTION  2016    Current Outpatient Medications  Medication Sig Dispense Refill  . allopurinol (ZYLOPRIM) 300 MG tablet Take 300 mg by mouth daily.    Marland Kitchen ALPRAZolam  (XANAX) 0.25 MG tablet Take 1 tablet (0.25 mg total) by mouth 2 (two) times daily as needed for anxiety. 30 tablet 0  . amiodarone (PACERONE) 200 MG tablet Take 1 tablet (200 mg total) by mouth 2 (two) times daily. Take 1 tablet 2 times a day for 1 week. Then take 1 tablet once daily. 60 tablet 1  . aspirin EC 81 MG EC tablet Take 1 tablet (81 mg total) by mouth daily. 30 tablet 0  . digoxin (LANOXIN) 0.125 MG tablet Take 1 tablet (0.125 mg total) by mouth daily. 30 tablet 3  . gabapentin (NEURONTIN) 300 MG capsule Take 300 mg by mouth 3 (three) times daily.    . magnesium oxide (MAG-OX) 400 MG tablet Take 400 mg by mouth 3 (three) times daily.    . methocarbamol (ROBAXIN) 500 MG tablet Take 1 tablet (500 mg total) by mouth 3 (three) times daily. 270 tablet 0  . metolazone (ZAROXOLYN) 2.5 MG tablet Take 1 tablet by mouth every Monday and Friday 30 tablet 3  . metoprolol succinate (TOPROL-XL) 25 MG 24 hr tablet Take 1 tablet (25 mg total) by mouth 2 (two) times daily. 30 tablet 6  . midodrine (PROAMATINE) 10 MG tablet Take 1 tablet (10 mg total) by mouth 3 (three) times daily with meals. 90 tablet 3  . mupirocin ointment (BACTROBAN) 2 % Apply 1 application topically 3 (three) times daily.     Marland Kitchen oxyCODONE (OXY IR/ROXICODONE) 5 MG immediate release tablet Take 1 tablet (5 mg total) by mouth every 8 (eight) hours as needed for severe pain. (Patient taking differently: Take 5 mg by mouth 3 (three) times daily. ) 90 tablet 0  . potassium chloride SA (KLOR-CON) 20 MEQ tablet Take 3 tablets (60 mEq total) by mouth 2 (two) times daily. 180 tablet 3  . PRESCRIPTION MEDICATION See admin instructions. CPAP- At bedtime and during any time of rest    . silver sulfADIAZINE (SILVADENE) 1 % cream Apply 1 application topically daily as needed (for wound care- both legs).     Marland Kitchen spironolactone (ALDACTONE) 25 MG tablet Take 1 tablet (25 mg total) by mouth daily. 30 tablet 6  . torsemide (DEMADEX) 20 MG tablet Take 4  tablets (80 mg total) by mouth 2 (two) times daily. 240 tablet 11  . traZODone (DESYREL) 100 MG tablet Take 100 mg by mouth at bedtime.     Marland Kitchen warfarin (COUMADIN) 5 MG tablet  Take 1 tablet (5 mg total) by mouth daily at 6 PM. 30 tablet 3   No current facility-administered medications for this visit.     Allergies:   Feraheme [ferumoxytol], Penicillins, Sulfamethoxazole-trimethoprim, and Tape   Social History:  The patient  reports that he has quit smoking. His smoking use included cigars. He has never used smokeless tobacco. He reports current alcohol use. He reports that he does not use drugs.   Family History:  The patient's family history includes Cancer in his maternal aunt and maternal uncle; Diabetes in his father; Heart disease in his mother; Hypertension in an other family member; Other in his mother; Stroke in an other family member; Throat cancer in his mother.  ROS:  Please see the history of present illness.    All other systems are reviewed and otherwise negative.   PHYSICAL EXAM:  VS:  BP (!) 100/54   Pulse (!) 59   Ht 5' (1.524 m)   Wt 189 lb (85.7 kg)   BMI 36.91 kg/m  BMI: Body mass index is 36.91 kg/m. Well nourished, well developed, in no acute distress  HEENT: normocephalic, atraumatic  Neck: no JVD, carotid bruits or masses Cardiac:  irreg-irreg,  Valves appreciated, no rubs, or gallops Lungs:  CTA b/l, no wheezing, rhonchi or rales  Abd: soft, nontender MS: no deformity, advanced atrophy (upper body) Ext: marked edema b/l, chronic skin changes (the patient/wife report this about where he has been since the hospital)  Skin: warm and dry, no rash Neuro:  No gross deficits appreciated Psych: euthymic mood, full affect  *ICD site: steri strips are removed without difficulty.  Skin edges are well approximated, no bleeding or drainage.  He has some mild soft swelling.  No erythema or increased heat to the surrounding  tissues   EKG:  Not done today ICD  interrogation done today and reviewed by myself:  Battery and lead measurements are stable He is programmed VVI (RV only) 40bpm <1% RV paced No VT/therapies   06/22/2019: TTE IMPRESSIONS 1. Left ventricular ejection fraction, by visual estimation, is 30 to 35%. The left ventricle has severely decreased function. Severely increased left ventricular size. There is no left ventricular hypertrophy.  2. Definity contrast agent was given IV to delineate the left ventricular endocardial borders.  3. Global hypokinesis, though function more preserved at base than apex. LV contrast excludes apical thrombus.  4. Global right ventricle has normal systolic function.The right ventricular size is normal. No increase in right ventricular wall thickness.  5. Left atrial size was normal.  6. Right atrial size was normal.  7. The mitral valve has been repaired/replaced. Mild mitral valve regurgitation. Moderate mitral stenosis.  8. The tricuspid valve is normal in structure. Tricuspid valve regurgitation mild-moderate.  9. 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 10. The aortic valve is normal in structure. Aortic valve regurgitation is trivial by color flow Doppler. Structurally normal aortic valve, with no evidence of sclerosis or stenosis. 11. Aortic valve with trivial perivalvular leak. 12. The pulmonic valve was grossly normal. Pulmonic valve regurgitation is not visualized by color flow Doppler. 13. S/P Bentall. 14. Moderately elevated pulmonary artery systolic pressure. 15. A pacer wire is visualized in the RV and RA. 16. The inferior vena cava is dilated in size with <50% respiratory variability, suggesting right atrial pressure of 15 mmHg. 17. History of mechanical aortic and mitral valves. Leaflets not well visualized, could consider fluroscopy of leaflets, but gradients not suggestive of obstruction.  04/07/2019: RHC 1. Filling pressures are mildly elevated, RV function appears  reasonable.  2. Pulmonary venous hypertension.  3. Preserved cardiac output.    04/04/2019: TEE IMPRESSIONS 1. Left atrial size was severely dilated. Smoke in the left atrium. Cannot rule out a small LA appendage thrombus (at tip of LAA).  2. Right atrial size was mildly dilated.  3. No PFO/ASD, negative bubble study.  4. Tricuspid valve regurgitation is mild-moderate. Peak RV-RA gradient 45 mmHg.  5. Mechanical mitral valve. There was mild peri-valvular regurgitation. Mean gradient across valve of 5 mmHg did not suggest significant stenosis.  6. Mechanical aortic valve. No significant regurgitation. Mean gradient 23 mmHg. The mean gradient is higher than expected but does not suggest severe stenosis. Visually, the valve appears to open well.  7. The left ventricle had a visually estimated ejection fraction of of 50%. The cavity size was normal. There was prominent septal-lateral dyssynchrony.  8. The right ventricle has normal systolc function. The cavity was normal.  FINDINGS  Left Ventricle: The left ventricle has a visually estimated ejection fraction of of 50%. The cavity size was normal. There is no increase in left ventricular wall thickness.  Right Ventricle: The right ventricle has normal systolic function. The cavity was normal.  Left Atrium: Left atrial size was severely dilated.   Right Atrium: Right atrial size was mildly dilated. Right atrial pressure is estimated at 10 mmHg.  Interatrial Septum: No atrial level shunt detected by color flow Doppler.  Mitral Valve: The mitral valve has been repaired/replaced.  Tricuspid Valve: Tricuspid valve regurgitation is mild-moderate by color flow Doppler.  Aortic Valve: The aortic valve has been repaired/replaced.  Pulmonic Valve: The pulmonic valve was grossly normal.   Recent Labs: 06/21/2019: ALT 11; B Natriuretic Peptide 381.5 06/27/2019: Magnesium 2.0 07/01/2019: BUN 35; Creatinine, Ser 1.06; Hemoglobin 9.5;  Platelets 222; Potassium 4.6; Sodium 131  No results found for requested labs within last 8760 hours.   Estimated Creatinine Clearance: 69.9 mL/min (by C-G formula based on SCr of 1.06 mg/dL).   Wt Readings from Last 3 Encounters:  07/07/19 189 lb (85.7 kg)  07/01/19 176 lb 8 oz (80.1 kg)  06/02/19 169 lb 12.8 oz (77 kg)     Other studies reviewed: Additional studies/records reviewed today include: summarized above  ASSESSMENT AND PLAN:  1. ICD     LV lead w/PNS, in review of notes by [facility] rep, POD #1, with no viable programming options, he either had PNS or non-capture on the lead      In review of Dr. Tanna Furry note, no plans for revision initially, could consider L bundle pacing in the future      No programming changes were made     Shock plan reviewed with the patient/wife, magnet and wallet cards provided  Site has some soft swelling, his INR was 3.6,  2 days ago, his dose held a day and then reduced. Given his INR, placed a pocket pal pressure dressing was placed.  He sees the HF clinic next week, they are instructed to leave this in place until then, (provided use instructions, shown how to place it/change cold pack/etc) am in hospital tomorrow and will message the HF APPs to call me when he is in the clinic to evaluate the site again  2. VT     no driving, re-discussed     Continue amiodarone, he is due to reduce amiodarone soon, they have it written at home  3. Chronic CHF (mixed)  Suspected to have developed restrictive CM     New systolic dysfunction post VT arrest, ? Stunned myocardium     He sees AHF team next week  4. Permanent AFib     CHA2DS2Vasc is 5, on warfarin     rate controlled today       5. VHD     Mechanical AVR/MVR     warfarin monitored and managed by the coumadin clinic   Disposition: as above, and with Dr. [name] as scheduled otherwise  Current medicines are reviewed at length with the patient today.  The patient did not have any concerns  regarding medicines.  Venetia Night, PA-C 07/07/2019 6:40 PM     [facility] [place] [id number] [phone] (office)  [phone] (fax)

## 2019-07-07 ENCOUNTER — Ambulatory Visit (INDEPENDENT_AMBULATORY_CARE_PROVIDER_SITE_OTHER): Payer: Medicare HMO | Admitting: Physician Assistant

## 2019-07-07 ENCOUNTER — Other Ambulatory Visit: Payer: Self-pay

## 2019-07-07 VITALS — BP 100/54 | HR 59 | Ht 60.0 in | Wt 189.0 lb

## 2019-07-07 DIAGNOSIS — Z4889 Encounter for other specified surgical aftercare: Secondary | ICD-10-CM | POA: Diagnosis not present

## 2019-07-07 DIAGNOSIS — I4821 Permanent atrial fibrillation: Secondary | ICD-10-CM

## 2019-07-07 DIAGNOSIS — I5033 Acute on chronic diastolic (congestive) heart failure: Secondary | ICD-10-CM | POA: Diagnosis not present

## 2019-07-07 DIAGNOSIS — Z95810 Presence of automatic (implantable) cardiac defibrillator: Secondary | ICD-10-CM | POA: Diagnosis not present

## 2019-07-07 DIAGNOSIS — I472 Ventricular tachycardia, unspecified: Secondary | ICD-10-CM

## 2019-07-07 NOTE — Progress Notes (Signed)
Could Lance Cook get left bundle pacing??

## 2019-07-07 NOTE — Patient Instructions (Addendum)
Medication Instructions:   Your physician recommends that you continue on your current medications as directed. Please refer to the Current Medication list given to you today.  *If you need a refill on your cardiac medications before your next appointment, please call your pharmacy*  Lab Work: [place]  If you have labs (blood work) drawn today and your tests are completely normal, you will receive your results only by: Marland Kitchen MyChart Message (if you have MyChart) OR . A paper copy in the mail If you have any lab test that is abnormal or we need to change your treatment, we will call you to review the results.  Testing/Procedures: NONE ORDERED  TODAY   Follow-Up: At [facility], you and your health needs are our priority.  As part of our continuing mission to provide you with exceptional heart care, we have created designated Provider Care Teams.  These Care Teams include your primary Cardiologist (physician) and Advanced Practice Providers (APPs -  Physician Assistants and Nurse Practitioners) who all work together to provide you with the care you need, when you need it.  Your next appointment:  AS SCHEDULED

## 2019-07-08 ENCOUNTER — Telehealth (HOSPITAL_COMMUNITY): Payer: Self-pay | Admitting: Cardiology

## 2019-07-08 NOTE — Telephone Encounter (Signed)
Amy, RN called during patient home visit to report weight gain since discharge. Report weight is slowly creeping upward and BLE has returned. Medications was decreased after discharge  Above reviewed with [name],NP If weight continues to increase over the week can take additional torsemide BID (100 BID for two days only). Be sure to keep follow up on 11/9.   Amy,RN aware and voiced understanding. Will call patient for weight check in the AM and advise further on the extra torsemide. Patient declined compression wraps/unna boots at home visit. He was agreeable to ACE wraps.

## 2019-07-10 ENCOUNTER — Other Ambulatory Visit (HOSPITAL_COMMUNITY): Payer: Self-pay | Admitting: Adult Health

## 2019-07-11 ENCOUNTER — Other Ambulatory Visit: Payer: Self-pay

## 2019-07-11 ENCOUNTER — Ambulatory Visit (HOSPITAL_COMMUNITY)
Admission: RE | Admit: 2019-07-11 | Discharge: 2019-07-11 | Disposition: A | Discharge status: Home / Self Care | Payer: Medicare HMO | Source: Ambulatory Visit | Attending: Cardiology | Admitting: Cardiology

## 2019-07-11 ENCOUNTER — Encounter (HOSPITAL_COMMUNITY): Payer: Self-pay

## 2019-07-11 VITALS — BP 126/68 | HR 72 | Wt 194.2 lb

## 2019-07-11 DIAGNOSIS — M1990 Unspecified osteoarthritis, unspecified site: Secondary | ICD-10-CM | POA: Diagnosis not present

## 2019-07-11 DIAGNOSIS — M109 Gout, unspecified: Secondary | ICD-10-CM | POA: Diagnosis not present

## 2019-07-11 DIAGNOSIS — Z801 Family history of malignant neoplasm of trachea, bronchus and lung: Secondary | ICD-10-CM | POA: Insufficient documentation

## 2019-07-11 DIAGNOSIS — Z888 Allergy status to other drugs, medicaments and biological substances status: Secondary | ICD-10-CM | POA: Diagnosis not present

## 2019-07-11 DIAGNOSIS — Z8547 Personal history of malignant neoplasm of testis: Secondary | ICD-10-CM | POA: Diagnosis not present

## 2019-07-11 DIAGNOSIS — Z87891 Personal history of nicotine dependence: Secondary | ICD-10-CM | POA: Insufficient documentation

## 2019-07-11 DIAGNOSIS — Z808 Family history of malignant neoplasm of other organs or systems: Secondary | ICD-10-CM | POA: Insufficient documentation

## 2019-07-11 DIAGNOSIS — Z823 Family history of stroke: Secondary | ICD-10-CM | POA: Insufficient documentation

## 2019-07-11 DIAGNOSIS — I482 Chronic atrial fibrillation, unspecified: Secondary | ICD-10-CM | POA: Insufficient documentation

## 2019-07-11 DIAGNOSIS — I447 Left bundle-branch block, unspecified: Secondary | ICD-10-CM | POA: Diagnosis not present

## 2019-07-11 DIAGNOSIS — E1151 Type 2 diabetes mellitus with diabetic peripheral angiopathy without gangrene: Secondary | ICD-10-CM | POA: Insufficient documentation

## 2019-07-11 DIAGNOSIS — I110 Hypertensive heart disease with heart failure: Secondary | ICD-10-CM | POA: Diagnosis not present

## 2019-07-11 DIAGNOSIS — I472 Ventricular tachycardia, unspecified: Secondary | ICD-10-CM

## 2019-07-11 DIAGNOSIS — Z95810 Presence of automatic (implantable) cardiac defibrillator: Secondary | ICD-10-CM | POA: Insufficient documentation

## 2019-07-11 DIAGNOSIS — G4733 Obstructive sleep apnea (adult) (pediatric): Secondary | ICD-10-CM | POA: Insufficient documentation

## 2019-07-11 DIAGNOSIS — I5043 Acute on chronic combined systolic (congestive) and diastolic (congestive) heart failure: Secondary | ICD-10-CM

## 2019-07-11 DIAGNOSIS — Z881 Allergy status to other antibiotic agents status: Secondary | ICD-10-CM | POA: Insufficient documentation

## 2019-07-11 DIAGNOSIS — Z800 Family history of malignant neoplasm of digestive organs: Secondary | ICD-10-CM | POA: Insufficient documentation

## 2019-07-11 DIAGNOSIS — Z79899 Other long term (current) drug therapy: Secondary | ICD-10-CM | POA: Insufficient documentation

## 2019-07-11 DIAGNOSIS — Z7982 Long term (current) use of aspirin: Secondary | ICD-10-CM | POA: Diagnosis not present

## 2019-07-11 DIAGNOSIS — Z8673 Personal history of transient ischemic attack (TIA), and cerebral infarction without residual deficits: Secondary | ICD-10-CM | POA: Diagnosis not present

## 2019-07-11 DIAGNOSIS — I25810 Atherosclerosis of coronary artery bypass graft(s) without angina pectoris: Secondary | ICD-10-CM | POA: Insufficient documentation

## 2019-07-11 DIAGNOSIS — Z952 Presence of prosthetic heart valve: Secondary | ICD-10-CM | POA: Insufficient documentation

## 2019-07-11 DIAGNOSIS — Z7901 Long term (current) use of anticoagulants: Secondary | ICD-10-CM | POA: Diagnosis not present

## 2019-07-11 DIAGNOSIS — Z91048 Other nonmedicinal substance allergy status: Secondary | ICD-10-CM | POA: Insufficient documentation

## 2019-07-11 DIAGNOSIS — Z6837 Body mass index (BMI) 37.0-37.9, adult: Secondary | ICD-10-CM | POA: Insufficient documentation

## 2019-07-11 DIAGNOSIS — Z880 Allergy status to penicillin: Secondary | ICD-10-CM | POA: Insufficient documentation

## 2019-07-11 DIAGNOSIS — Z9079 Acquired absence of other genital organ(s): Secondary | ICD-10-CM | POA: Diagnosis not present

## 2019-07-11 DIAGNOSIS — Z833 Family history of diabetes mellitus: Secondary | ICD-10-CM | POA: Insufficient documentation

## 2019-07-11 DIAGNOSIS — Z8249 Family history of ischemic heart disease and other diseases of the circulatory system: Secondary | ICD-10-CM | POA: Insufficient documentation

## 2019-07-11 DIAGNOSIS — F419 Anxiety disorder, unspecified: Secondary | ICD-10-CM | POA: Insufficient documentation

## 2019-07-11 DIAGNOSIS — Z951 Presence of aortocoronary bypass graft: Secondary | ICD-10-CM | POA: Diagnosis not present

## 2019-07-11 LAB — COMPREHENSIVE METABOLIC PANEL
ALT: 11 U/L (ref 0–44)
AST: 12 U/L — ABNORMAL LOW (ref 15–41)
Albumin: 2.2 g/dL — ABNORMAL LOW (ref 3.5–5.0)
Alkaline Phosphatase: 40 U/L (ref 38–126)
Anion gap: 8 (ref 5–15)
BUN: 55 mg/dL — ABNORMAL HIGH (ref 6–20)
CO2: 30 mmol/L (ref 22–32)
Calcium: 8.1 mg/dL — ABNORMAL LOW (ref 8.9–10.3)
Chloride: 90 mmol/L — ABNORMAL LOW (ref 98–111)
Creatinine, Ser: 1.61 mg/dL — ABNORMAL HIGH (ref 0.61–1.24)
GFR calc Af Amer: 54 mL/min — ABNORMAL LOW (ref >60)
GFR calc non Af Amer: 47 mL/min — ABNORMAL LOW (ref >60)
Glucose, Bld: 85 mg/dL (ref 70–99)
Potassium: 5 mmol/L (ref 3.5–5.1)
Sodium: 128 mmol/L — ABNORMAL LOW (ref 135–145)
Total Bilirubin: 0.8 mg/dL (ref 0.3–1.2)
Total Protein: 4.7 g/dL — ABNORMAL LOW (ref 6.5–8.1)

## 2019-07-11 LAB — TSH: TSH: 7.217 u[IU]/mL — ABNORMAL HIGH (ref 0.350–4.500)

## 2019-07-11 LAB — MAGNESIUM: Magnesium: 2.3 mg/dL (ref 1.7–2.4)

## 2019-07-11 MED ORDER — METOPROLOL SUCCINATE ER 25 MG PO TB24: 12.5000 mg | ORAL_TABLET | Freq: Two times a day (BID) | ORAL | 6 refills | Status: DC

## 2019-07-11 MED ORDER — METOLAZONE 5 MG PO TABS: ORAL_TABLET | ORAL | 3 refills | Status: DC

## 2019-07-11 MED ORDER — TORSEMIDE 20 MG PO TABS: 100.0000 mg | ORAL_TABLET | Freq: Two times a day (BID) | ORAL | 11 refills | Status: DC

## 2019-07-11 NOTE — Patient Instructions (Addendum)
INCREASE Metolazone to 5 mg, one tab twice a week on Mondays and Thursdays INCREASE Torsemide to 100 mg (5 tabs) twice daily DECREASE Metoprolol to 12.5 mg (one half tab) twice daily   It is strongly recommended  that you adhere to the unna boots for bilateral lower extremity swelling, your home health nurse will be in contact to arrange these services.    Labs today We will only contact you if something comes back abnormal or we need to make some changes. Otherwise no news is good news!   Your physician recommends that you schedule a follow-up appointment in: 1 week with Dr [name] or  in the [facility] ([facility]/NP) [facility]   Do the following things EVERYDAY: 1) Weigh yourself in the morning before breakfast. Write it down and keep it in a log. 2) Take your medicines as prescribed 3) Eat low salt foods-Limit salt (sodium) to 2000 mg per day.  4) Stay as active as you can everyday 5) Limit all fluids for the day to less than 2 liters  At the [facility], you and your health needs are our priority. As part of our continuing mission to provide you with exceptional heart care, we have created designated Provider Care Teams. These Care Teams include your primary Cardiologist (physician) and Advanced Practice Providers (APPs- Physician Assistants and Nurse Practitioners) who all work together to provide you with the care you need, when you need it.   You may see any of the following providers on your designated Care Team at your next follow up: Marland Kitchen Dr Glori Bickers . Dr Loralie Champagne . Darrick Grinder, NP . Lyda Jester, PA   Please be sure to bring in all your medications bottles to every appointment.

## 2019-07-11 NOTE — Progress Notes (Addendum)
Advanced Heart Failure Clinic Note   Referring Physician: PCP: [name], MD PCP-Cardiologist: [name], MD  [facility]: Dr. [name]  EP: Dr. 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 This procedure was complicated by ARDS and prolonged time on the vent. He had SVG-RCA with original surgery. Last cath in 4/16 prior to MV surgery showed patent native coronaries, SVG-RCA likely occluded (never identified). He has had chronic atrial fibrillation and has history of CVA.   Initially after MV surgery, he did fairly well. 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 He had reasonable quality of life on po Lasix with occasional episodes of fluid retention that were manageable. However, starting in the Spring of 2020, he began to decompensate. He was admitted in 6/20 and was diuresed with IV Lasix, he lost 20 lbs. However, after discharge, po diuretics were not effective at managing the re-development of volume overload. He was re-admitted 04/01/19 with marked volume overload. Diuresed with lasix drip and later transitioned to torsemide 60 mg twice a day. Hospital course complicated by severe allergic reaction to feraheme. He was placed on bipap, solumedrol, Neo and given midodrine. Discharge weight was 165.5 pounds. While in the hospital, he had a TEE showing markedly dilated LA, LV with EF 50%, septal-lateral dyssynchrony, normal RV systolic function/size, mechanical MV with mean gradient 5 and mild peri-valvular regurgitation, mechanical AoV with mean gradient 23, appeared to open well.   After discharge, he developed progressive volume overload requiring outpatient diuretic dose adjustments. Torsemide  was increased to 80 bid and metolazone 5 mg 2 days a week (Mon + Fri). Also on spironolactone 25 mg daily.   On 10/20, he presented to the [facility] ED w/ complaints of increased dyspnea and progressive 25 lb wt gain. Started on IV diuretics for a/c CHF.   Morning of 10/21, pt went into WCT in the 200s requiring defibrillation. ? afib w/ aberancy vs VT. Started on IV amiodarone. EP consulted.  Confirmed to be VT. Rate 200s, monomorphic. No LOC. Did not require CPR. Cardioversion x 3 failed, he converted back to atrial fibrillation after amiodarone boluses. He had no recurrent VT. Echo after VT showed reduced LVEF, 30-35% but no chest pain and HS-Tn not significantly elevated, making ACS/coronary ischemia unlikely. PYP scan showed no evidence for ATTR amyloidosis. He underwent placement of a [employer] CRT-D device on 10/26 and was continued on amiodarone.  Unfortunately, he had diaphragmatic stimulation, LV lead turned off. Per Dr. [name], will hold off on revision for now and in the future could consider Left bundle pacing if needed.   From a CHF standpoint, he was diuresed and placed back on PO diuretics. He was discharged home on torsemide 80 mg bid, metolazone 2.5 mg 2 days a week and spironolactoneat 25 mg daily. Also treated w/ digoxin and metoprolol. Due to soft BP, he required midodrine10 mg tid. His discharge wt was 176 lb.  He presents today for f/u. Here w/ his wife. Significant wt gain, almost 20 lb, up from 176>>194 lb. Significant LEE up to thighs. NYHA class II-III symptoms. Denies orthopnea/PND. Reports compliance w/ diuretics. Followed by home health RN. Over the weekend, he was instructed to  increase torsemide to 100 mg bid.  He has been restricting sodium and fluid intake. He remains on midodrine but pressures still low at home in the 0000000 systolic. BP 126/88 today but this is prior to AM meds. Amiodarone has been reduced down to 200 mg daily. He denies palpitations. No dizziness,  syncope/ near syncope or chest pain. Device pocket stable.     Review of Systems: [y] = yes, [ ]  = no   General: Weight gain [ ] ; Weight loss [ ] ; Anorexia [ ] ; Fatigue [ ] ; Fever [ ] ; Chills [ ] ; Weakness [ ]   Cardiac: Chest pain/pressure [ ] ; Resting SOB [ ] ; Exertional SOB [ ] ; Orthopnea [ ] ; Pedal Edema [ ] ; Palpitations [ ] ; Syncope [ ] ; Presyncope [ ] ; Paroxysmal nocturnal dyspnea[ ]   Pulmonary: Cough [ ] ; Wheezing[ ] ; Hemoptysis[ ] ; Sputum [ ] ; Snoring [ ]   GI: Vomiting[ ] ; Dysphagia[ ] ; Melena[ ] ; Hematochezia [ ] ; Heartburn[ ] ; Abdominal pain [ ] ; Constipation [ ] ; Diarrhea [ ] ; BRBPR [ ]   GU: Hematuria[ ] ; Dysuria [ ] ; Nocturia[ ]   Vascular: Pain in legs with walking [ ] ; Pain in feet with lying flat [ ] ; Non-healing sores [ ] ; Stroke [ ] ; TIA [ ] ; Slurred speech [ ] ;  Neuro: Headaches[ ] ; Vertigo[ ] ; Seizures[ ] ; Paresthesias[ ] ;Blurred vision [ ] ; Diplopia [ ] ; Vision changes [ ]   Ortho/Skin: Arthritis [ ] ; Joint pain [ ] ; Muscle pain [ ] ; Joint swelling [ ] ; Back Pain [ ] ; Rash [ ]   Psych: Depression[ ] ; Anxiety[ ]   Heme: Bleeding problems [ ] ; Clotting disorders [ ] ; Anemia [ ]   Endocrine: Diabetes [ ] ; Thyroid dysfunction[ ]    Past Medical History:  Diagnosis Date   Anemia    Anxiety    ARDS (adult respiratory distress syndrome) (HCC) 01/27/2015   Arthritis    Ascending aortic dissection (HCC) 07/14/2008   Localized dissection of ascending aorta noted on CTA in 2009 and stable on CTA in 2011   Atrial fibrillation (HCC)    chronic persistent   Bell's palsy    C. difficile diarrhea    CAD (coronary artery disease)    Old scar inferior wall myoview, 10/2009 EF 52%.  He did have previous SVG to RCA but no obstructive disease noted on his most recent cath.  SVG occluded.    Cellulitis 04/02/2014   Cerebral artery occlusion with cerebral infarction ([place]) 10/08/2011   Overview:  Overview:  And hx of TIA prior to CABG, all thought due to systemic emboli prior to  coumadin    Chronic diastolic heart failure ([place]) 03/01/2015   a. 12/2015: echo showing a preserved EF of 65-70%, moderate AS, and moderate TR.    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 He had surgical resection and rad tx's.    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     ALPRAZolam (XANAX) 0.25 MG tablet Take 1 tablet (0.25 mg total) by mouth 2 (two) times daily as needed for  anxiety. 30 tablet 0   amiodarone (PACERONE) 200 MG tablet Take 1 tablet (200 mg total) by mouth 2 (two) times daily. Take 1 tablet 2 times a day for 1 week. Then take 1 tablet once daily. 60 tablet 1   aspirin EC 81 MG EC tablet Take 1 tablet (81 mg total) by mouth daily. 30 tablet 0   digoxin (LANOXIN) 0.125 MG tablet Take 1 tablet (0.125 mg total) by mouth daily. 30 tablet 3   gabapentin (NEURONTIN) 300 MG capsule Take 300 mg by mouth 3 (three) times daily.     magnesium oxide (MAG-OX) 400 MG tablet Take 400 mg by mouth 3 (three) times daily.     methocarbamol (ROBAXIN) 500 MG tablet Take 1 tablet (500 mg total) by mouth 3 (three) times daily. 270 tablet 0   metolazone (ZAROXOLYN) 5 MG tablet Take 1 tablet by mouth every Monday and Friday 8 tablet 3   metoprolol succinate (TOPROL-XL) 25 MG 24 hr tablet Take 0.5 tablets (12.5 mg total) by mouth 2 (two) times daily. 15 tablet 6   midodrine (PROAMATINE) 10 MG tablet Take 1 tablet (10 mg total) by mouth 3 (three) times daily with meals. 90 tablet 3   mupirocin ointment (BACTROBAN) 2 % Apply 1 application topically 3 (three) times daily.      oxyCODONE (OXY IR/ROXICODONE) 5 MG immediate release tablet Take 1 tablet (5 mg total) by mouth every 8 (eight) hours as needed for severe pain. (Patient taking differently: Take 5 mg by mouth 3 (three) times daily. ) 90 tablet 0   potassium chloride SA (KLOR-CON) 20 MEQ tablet Take 3 tablets (60 mEq total) by mouth 2 (two) times daily. 180 tablet 3   PRESCRIPTION MEDICATION See admin instructions. CPAP- At bedtime and during any time of rest     silver sulfADIAZINE (SILVADENE) 1 % cream Apply 1 application topically daily as needed (for wound care- both legs).      spironolactone (ALDACTONE) 25 MG tablet Take 1 tablet (25 mg total) by mouth daily. 30 tablet 6   torsemide (DEMADEX) 20 MG tablet Take 5 tablets (100 mg total) by mouth 2 (two) times daily. 300 tablet 11   traZODone (DESYREL) 100 MG  tablet Take 100 mg by mouth at bedtime.      warfarin (COUMADIN) 5 MG tablet Take 1  tablet (5 mg total) by mouth daily at 6 PM. 30 tablet 3   No current facility-administered medications for this encounter.     Allergies  Allergen Reactions   Feraheme [Ferumoxytol] Shortness Of Breath    SOB/Tachycardia   Penicillins Hives and Rash    Has patient had a PCN reaction causing immediate rash, facial/tongue/throat swelling, SOB or lightheadedness with hypotension: Yes Has patient had a PCN reaction causing severe rash involving mucus membranes or skin necrosis: Yes Has patient had a PCN reaction that required hospitalization unsure Has patient had a PCN reaction occurring within the last 10 years: 2010-2012 If all of the above answers are "NO", then may proceed with Cephalosporin use.   Sulfamethoxazole-Trimethoprim Other (See Comments)    CDIFF, when taking oral tablets    Tape Other (See Comments) and Itching    Skin irritation      Social History   Socioeconomic History   Marital status: Married    Spouse name: Not on file   Number of children: 0   Years of education: Not on file   Highest education level: Not on file  Occupational History   Occupation: Disabled Environmental education officer: UNEMPLOYED  Social Designer, fashion/clothing strain: Not on file   Food insecurity    Worry: Not on file    Inability: Not on file   Transportation needs    Medical: Not on file    Non-medical: Not on file  Tobacco Use   Smoking status: Former Smoker    Types: Cigars   Smokeless tobacco: Never Used   Tobacco comment: about 3 yearly- cigar  Substance and Sexual Activity   Alcohol use: Yes    Alcohol/week: 0.0 standard drinks    Comment: social   Drug use: No   Sexual activity: Not on file  Lifestyle   Physical activity    Days per week: Not on file    Minutes per session: Not on file   Stress: Not on file  Relationships   Social connections    Talks on  phone: Not on file    Gets together: Not on file    Attends religious service: Not on file    Active member of club or organization: Not on file    Attends meetings of clubs or organizations: Not on file    Relationship status: Not on file   Intimate partner violence    Fear of current or ex partner: Not on file    Emotionally abused: Not on file    Physically abused: Not on file    Forced sexual activity: Not on file  Other Topics Concern   Not on file  Social History Narrative   Not on file      Family History  Problem Relation Age of Onset   Heart disease Mother    Throat cancer Mother    Other Mother        bowel obstruction   Diabetes Father    Stroke Other    Hypertension Other    Cancer Maternal Aunt        lung, brain   Cancer Maternal Uncle        brain aneurysm   Colon cancer Neg Hx     Vitals:   07/11/19 1004  BP: 126/68  Pulse: 72  SpO2: 95%  Weight: 88.1 kg (194 lb 3.2 oz)     PHYSICAL EXAM: General:  Well appearing WM. No respiratory difficulty HEENT: normal  Neck: supple. elevated JVD. Carotids 2+ bilat; no bruits. No lymphadenopathy or thyromegaly appreciated. Cor: PMI nondisplaced. irregularly irregular rhythm, regular rate. Crisp mechanical valve sounds. No rubs, gallops or murmurs. Lungs: clear Abdomen: soft, nontender, nondistended. No hepatosplenomegaly. No bruits or masses. Good bowel sounds. Extremities: no cyanosis, clubbing, rash, 2+ bilateral LEE up to thighs Neuro: alert & oriented x 3, cranial nerves grossly intact. moves all 4 extremities w/o difficulty. Affect pleasant.  ECG: not performed   ASSESSMENT & PLAN:  1.Acute on chronic combined systolic and diastolic: Previous Echo [id number] w/ EF 50% with septal-lateral dyssynchrony (c/w LBBB), normal-appearing RV, mechanical MV with mild peri-valvular regurgitation and mean gradient 5, mechanical AoV with mean gradient 23 mmHg. He has permanentatrial fibrillation.  Dysfunction of his mechanical valves does not appear to explain his worsening CHFthis year. 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 RHC 8/6/20showed mildly elevated filling pressures.He was doing well until 10/20, then developed progressive volume overload and was readmitted. Echo done after VT arrest with DCCV x 3 showed EF 30-35% with global HK, normal RV size and systolic function, mechanical MV mean gradient 5.7 and mild MR, mechanical AoV mean gradient 12, dilated IVC. It is possible fall in EF is related to stunning from VT arrest. HS-TnI was not significantly elevated. 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 Of note, PYP scan showed no evidence for ATTR amyloidosis. Valves sounds remain crisp on exam and gradients stable on echo. He is now back on po diuretics. -NHYA II-III symptoms w/ significant volume overload w/ 2+ bilateral LEE. Wt up 18 lb from recent d/c wt, 176>>194 lb.  -Torsemide increased 2 days ago to 100 mg bid. Continue current dose.  -Continue spironolactoneat 25 mg daily. - Increase metolazone to previous home dose of 5 mg M, Thur.  - Check CMP and Mg level today - Continue supp K  - Continue digoxin 0.125 daily - His BP will not tolerate Entresto/ Losartan  - He is on midodrine10 mg tidcurrently to maintain BP, will continue for now.  - Reduce dose of Toprol XL to 12.5 mg bid to allow more BP room to push diuretics (reports SBPs in the low 90s) - Consider later addition of an SGLT2i  - Continue low salt diet and fluid restriction.  - Recommend compliance w/ UNNA boots. [place] RN contacted w/ order.  - LV lead off due to diaphragmatic stimulation and will try different vectors for LV pacing eventually. Discussed w/ EP APP again today regarding this. They will discuss further w/ Dr. [name] to determine timing.  2. Valvular heart disease: Patient has mechanical mitral and aortic valves. TEEin 8/20showed  mechanical MV with mean gradient 5 mmHg, mild peri-valvular regurgitation. The aortic valve appeared to open reasonably well but mean gradient was 23 mmHg, ?component of mild patient-prosthesis mismatch. Recent echo 10/20 showed stable gradients (lower for aortic valve).  - Continue ASA 81 and warfarin with INR goal 2.5-3.5.  - INRs checked and reported to coumadin clinic by [facility] RN 3. Atrial fibrillation: Chronic. Long-standing atrial fibrillation with severely dilated atria, unlikely to have a successful cardioversion.  -Continue Toprol XL. Reduce dose to 12.5 mg bid to allow more BP room to push diuretics. -Coumadin per above. 4. VT: Patient with VT recent admission, rate 200s, monomorphic. He did not lose consciousness or require CPR. Cardioversion x 3 failed, he converted back to atrial fibrillation after amiodarone boluses. No recent chest pain and HS-TnI during  hospitalization not significantly elevated, making ACS unlikely. He was hypokalemic and K supp. Cath in 4/16 with no significant disease. Now with [facility] [employer] CRT-D device. LV lead off due to diaphragmatic stimulation and will try different vectors for LV pacing eventually.  -Discussed w/ EP APP again today regarding LV lead revision. They will discuss further w/ Dr. [name] to determine timing. His device pocket is stable.  -Continue amiodarone 200 mg once daily - Check CMP, Mg and TSH today - Advised that he will need yearly eye exams  5. CAD: Last cath in 4/16 with patent native coronaries, probably occluded SVG-RCA. Doubt CAD as cause of his recent CHF decompensation. Denies CP.  -continue medical therapy w/ ASA +  blocker 6. Type 2 diabetes:  - Consider future addition of an SGLT2i   F/u in 1 week w/ MD or APP to reassess response to diuretic dose adjustment and repeat labs.   Lyda Jester, PA-C 07/11/19

## 2019-07-12 ENCOUNTER — Ambulatory Visit (INDEPENDENT_AMBULATORY_CARE_PROVIDER_SITE_OTHER): Payer: Medicare HMO | Admitting: Internal Medicine

## 2019-07-12 DIAGNOSIS — Z5181 Encounter for therapeutic drug level monitoring: Secondary | ICD-10-CM

## 2019-07-12 DIAGNOSIS — I482 Chronic atrial fibrillation, unspecified: Secondary | ICD-10-CM | POA: Diagnosis not present

## 2019-07-12 DIAGNOSIS — Z954 Presence of other heart-valve replacement: Secondary | ICD-10-CM | POA: Diagnosis not present

## 2019-07-12 LAB — POCT INR: INR: 3.9 — AB (ref 2.0–3.0)

## 2019-07-12 NOTE — Patient Instructions (Signed)
Description   Started amiodarone 10/21 via load then po. Spoke with Amy LPN from [facility] to have pt hold today's dose then start taking 1 tablet (5mg ) daily expect 1/2 tablet on Tuesday, Thursday, and Saturday. Recheck INR in 1 week. Coumadin Clinic [phone].

## 2019-07-19 ENCOUNTER — Ambulatory Visit (INDEPENDENT_AMBULATORY_CARE_PROVIDER_SITE_OTHER): Payer: Medicare HMO | Admitting: Pharmacist

## 2019-07-19 ENCOUNTER — Ambulatory Visit (HOSPITAL_COMMUNITY)
Admission: RE | Admit: 2019-07-19 | Discharge: 2019-07-19 | Disposition: A | Discharge status: Home / Self Care | Payer: Medicare HMO | Source: Ambulatory Visit | Attending: Adult Health | Admitting: Adult Health

## 2019-07-19 ENCOUNTER — Other Ambulatory Visit: Payer: Self-pay

## 2019-07-19 ENCOUNTER — Encounter (HOSPITAL_COMMUNITY): Payer: Self-pay

## 2019-07-19 VITALS — BP 117/65 | HR 76 | Wt 176.0 lb

## 2019-07-19 DIAGNOSIS — Z87891 Personal history of nicotine dependence: Secondary | ICD-10-CM | POA: Insufficient documentation

## 2019-07-19 DIAGNOSIS — I5022 Chronic systolic (congestive) heart failure: Secondary | ICD-10-CM | POA: Diagnosis not present

## 2019-07-19 DIAGNOSIS — F419 Anxiety disorder, unspecified: Secondary | ICD-10-CM | POA: Diagnosis not present

## 2019-07-19 DIAGNOSIS — Z9079 Acquired absence of other genital organ(s): Secondary | ICD-10-CM | POA: Diagnosis not present

## 2019-07-19 DIAGNOSIS — Z888 Allergy status to other drugs, medicaments and biological substances status: Secondary | ICD-10-CM | POA: Diagnosis not present

## 2019-07-19 DIAGNOSIS — Z5181 Encounter for therapeutic drug level monitoring: Secondary | ICD-10-CM | POA: Diagnosis not present

## 2019-07-19 DIAGNOSIS — I2510 Atherosclerotic heart disease of native coronary artery without angina pectoris: Secondary | ICD-10-CM | POA: Insufficient documentation

## 2019-07-19 DIAGNOSIS — I4821 Permanent atrial fibrillation: Secondary | ICD-10-CM | POA: Diagnosis not present

## 2019-07-19 DIAGNOSIS — Z7982 Long term (current) use of aspirin: Secondary | ICD-10-CM | POA: Insufficient documentation

## 2019-07-19 DIAGNOSIS — Z954 Presence of other heart-valve replacement: Secondary | ICD-10-CM | POA: Diagnosis not present

## 2019-07-19 DIAGNOSIS — Z8547 Personal history of malignant neoplasm of testis: Secondary | ICD-10-CM | POA: Insufficient documentation

## 2019-07-19 DIAGNOSIS — Z9989 Dependence on other enabling machines and devices: Secondary | ICD-10-CM

## 2019-07-19 DIAGNOSIS — I110 Hypertensive heart disease with heart failure: Secondary | ICD-10-CM | POA: Insufficient documentation

## 2019-07-19 DIAGNOSIS — E1151 Type 2 diabetes mellitus with diabetic peripheral angiopathy without gangrene: Secondary | ICD-10-CM | POA: Diagnosis not present

## 2019-07-19 DIAGNOSIS — I472 Ventricular tachycardia, unspecified: Secondary | ICD-10-CM

## 2019-07-19 DIAGNOSIS — Z881 Allergy status to other antibiotic agents status: Secondary | ICD-10-CM | POA: Insufficient documentation

## 2019-07-19 DIAGNOSIS — I482 Chronic atrial fibrillation, unspecified: Secondary | ICD-10-CM

## 2019-07-19 DIAGNOSIS — Z800 Family history of malignant neoplasm of digestive organs: Secondary | ICD-10-CM | POA: Insufficient documentation

## 2019-07-19 DIAGNOSIS — M1990 Unspecified osteoarthritis, unspecified site: Secondary | ICD-10-CM | POA: Diagnosis not present

## 2019-07-19 DIAGNOSIS — M109 Gout, unspecified: Secondary | ICD-10-CM | POA: Diagnosis not present

## 2019-07-19 DIAGNOSIS — G4733 Obstructive sleep apnea (adult) (pediatric): Secondary | ICD-10-CM

## 2019-07-19 DIAGNOSIS — Z8673 Personal history of transient ischemic attack (TIA), and cerebral infarction without residual deficits: Secondary | ICD-10-CM | POA: Diagnosis not present

## 2019-07-19 DIAGNOSIS — I080 Rheumatic disorders of both mitral and aortic valves: Secondary | ICD-10-CM | POA: Diagnosis not present

## 2019-07-19 DIAGNOSIS — Z823 Family history of stroke: Secondary | ICD-10-CM | POA: Insufficient documentation

## 2019-07-19 DIAGNOSIS — Z7901 Long term (current) use of anticoagulants: Secondary | ICD-10-CM | POA: Diagnosis not present

## 2019-07-19 DIAGNOSIS — Z8249 Family history of ischemic heart disease and other diseases of the circulatory system: Secondary | ICD-10-CM | POA: Insufficient documentation

## 2019-07-19 DIAGNOSIS — E876 Hypokalemia: Secondary | ICD-10-CM | POA: Diagnosis not present

## 2019-07-19 DIAGNOSIS — Z6834 Body mass index (BMI) 34.0-34.9, adult: Secondary | ICD-10-CM | POA: Diagnosis not present

## 2019-07-19 DIAGNOSIS — Z79899 Other long term (current) drug therapy: Secondary | ICD-10-CM | POA: Insufficient documentation

## 2019-07-19 DIAGNOSIS — Z952 Presence of prosthetic heart valve: Secondary | ICD-10-CM | POA: Diagnosis not present

## 2019-07-19 DIAGNOSIS — I25810 Atherosclerosis of coronary artery bypass graft(s) without angina pectoris: Secondary | ICD-10-CM

## 2019-07-19 DIAGNOSIS — Z833 Family history of diabetes mellitus: Secondary | ICD-10-CM | POA: Insufficient documentation

## 2019-07-19 DIAGNOSIS — I5042 Chronic combined systolic (congestive) and diastolic (congestive) heart failure: Secondary | ICD-10-CM | POA: Diagnosis not present

## 2019-07-19 DIAGNOSIS — Z808 Family history of malignant neoplasm of other organs or systems: Secondary | ICD-10-CM | POA: Insufficient documentation

## 2019-07-19 DIAGNOSIS — I447 Left bundle-branch block, unspecified: Secondary | ICD-10-CM | POA: Insufficient documentation

## 2019-07-19 DIAGNOSIS — Z880 Allergy status to penicillin: Secondary | ICD-10-CM | POA: Diagnosis not present

## 2019-07-19 DIAGNOSIS — Z801 Family history of malignant neoplasm of trachea, bronchus and lung: Secondary | ICD-10-CM | POA: Insufficient documentation

## 2019-07-19 LAB — BASIC METABOLIC PANEL
Anion gap: 7 (ref 5–15)
BUN: 59 mg/dL — ABNORMAL HIGH (ref 6–20)
CO2: 32 mmol/L (ref 22–32)
Calcium: 8.3 mg/dL — ABNORMAL LOW (ref 8.9–10.3)
Chloride: 87 mmol/L — ABNORMAL LOW (ref 98–111)
Creatinine, Ser: 1.45 mg/dL — ABNORMAL HIGH (ref 0.61–1.24)
GFR calc Af Amer: >60 mL/min (ref >60)
GFR calc non Af Amer: 53 mL/min — ABNORMAL LOW (ref >60)
Glucose, Bld: 94 mg/dL (ref 70–99)
Potassium: 3.8 mmol/L (ref 3.5–5.1)
Sodium: 126 mmol/L — ABNORMAL LOW (ref 135–145)

## 2019-07-19 LAB — TSH: TSH: 7.778 u[IU]/mL — ABNORMAL HIGH (ref 0.350–4.500)

## 2019-07-19 LAB — PROTIME-INR: INR: 6.1 — AB (ref 0.9–1.1)

## 2019-07-19 LAB — POCT INR: INR: 6.1 — AB (ref 2.0–3.0)

## 2019-07-19 LAB — BRAIN NATRIURETIC PEPTIDE: B Natriuretic Peptide: 298.7 pg/mL — ABNORMAL HIGH (ref 0.0–100.0)

## 2019-07-19 LAB — MAGNESIUM: Magnesium: 2.3 mg/dL (ref 1.7–2.4)

## 2019-07-19 LAB — T4, FREE: Free T4: 1.19 ng/dL — ABNORMAL HIGH (ref 0.61–1.12)

## 2019-07-19 NOTE — Progress Notes (Signed)
Advanced Heart Failure Clinic Note   Referring Physician: PCP: Lance Cook., MD PCP-Cardiologist: Lance Breeding, MD  [facility]: Dr. Aundra Cook  EP: Dr. Lovena Cook  HPI:  Lance Cohenis [id number].with history of Bentall procedure with St Jude mechanical aortic alve and conduit in 1988. 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 This procedure was complicated by ARDS and prolonged time on the vent. He had SVG-RCA with original surgery. Last cath in 4/16 prior to MV surgery showed patent native coronaries, SVG-RCA likely occluded (never identified). He has had chronic atrial fibrillation and has history of CVA.   Initially after MV surgery, he did fairly well. 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 He had reasonable quality of life on po Lasix with occasional episodes of fluid retention that were manageable. However, starting in the Spring of 2020, he began to decompensate. He was admitted in 6/20 and was diuresed with IV Lasix, he lost 20 lbs. However, after discharge, po diuretics were not effective at managing the re-development of volume overload. He was re-admitted 04/01/19 with marked volume overload. Diuresed with lasix drip and later transitioned to torsemide 60 mg twice a day. Hospital course complicated by severe allergic reaction to feraheme. He was placed on bipap, solumedrol, Neo and given midodrine. Discharge weight was 165.5 pounds. While in the hospital, he had a TEE showing markedly dilated LA, LV with EF 50%, septal-lateral dyssynchrony, normal RV systolic function/size, mechanical MV with mean gradient 5 and mild peri-valvular regurgitation, mechanical AoV with mean gradient 23, appeared to open well.   After discharge, he developed progressive volume overload requiring outpatient diuretic dose adjustments. Torsemide was increased to 80 bid and metolazone 5 mg 2 days a week (Mon + Fri). Also on spironolactone 25 mg  daily.   On 10/20, he presented to the [facility] ED w/ complaints of increased dyspnea and progressive 25 lb wt gain. Started on IV diuretics for a/c CHF.   Morning of 10/21, pt went into WCT in the 200s requiring defibrillation. ? afib w/ aberancy vs VT. Started on IV amiodarone. EP consulted.  Confirmed to be VT. Rate 200s, monomorphic. No LOC. Did not require CPR. Cardioversion x 3 failed, he converted back to atrial fibrillation after amiodarone boluses. He had no recurrent VT. Echo after VT showed reduced LVEF, 30-35% but no chest pain and HS-Tn not significantly elevated, making ACS/coronary ischemia unlikely. PYP scan showed no evidence for ATTR amyloidosis. He underwent placement of a Lance Cook CRT-D device on 10/26 and was continued on amiodarone.  Unfortunately, he had diaphragmatic stimulation, LV lead turned off. Per Dr. Lovena Cook, will hold off on revision for now and in the future could consider Left bundle pacing if needed.   From a CHF standpoint, he was diuresed and placed back on PO diuretics. He was discharged home on torsemide 80 mg bid, metolazone 2.5 mg 2 days a week and spironolactoneat 25 mg daily. Also treated w/ digoxin and metoprolol. Due to soft BP, he required midodrine10 mg tid. His discharge wt was 176 lb.  Today he returns for HF follow up. Last visit metolazone was increased to twice a week due marked volume overload. Weight at home has gone down from 190--->173 pounds. Overall feeling better. SOB with mild exertion. Denies PND/Orthopnea. Appetite ok. No fever or chills.  Taking all medications. Continues to be followed by [facility] RN/PT.    Past Medical History:  Diagnosis Date   Anemia  Anxiety    ARDS (adult respiratory distress syndrome) ([place]) 01/27/2015   Arthritis    Ascending aortic dissection ([place]) 07/14/2008   Localized dissection of ascending aorta noted on CTA in 2009 and stable on CTA in 2011   Atrial fibrillation (HCC)    chronic persistent    Bell's palsy    C. difficile diarrhea    CAD (coronary artery disease)    Old scar inferior wall myoview, 10/2009 EF 52%.  He did have previous SVG to RCA but no obstructive disease noted on his most recent cath.  SVG occluded.    Cellulitis 04/02/2014   Cerebral artery occlusion with cerebral infarction ([place]) 10/08/2011   Overview:  Overview:  And hx of TIA prior to CABG, all thought due to systemic emboli prior to coumadin    Chronic diastolic heart failure ([place]) 03/01/2015   a. 12/2015: echo showing a preserved EF of 65-70%, moderate AS, and moderate TR.    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 He had surgical resection and rad tx's.    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     ALPRAZolam (XANAX) 0.25 MG tablet Take 1 tablet (0.25 mg total) by mouth 2 (two) times daily as needed for anxiety. 30 tablet 0   amiodarone (PACERONE) 200 MG tablet Take 1 tablet (200 mg total) by mouth 2 (two) times daily. Take 1 tablet 2 times a day for 1 week. Then take 1 tablet once daily. 60 tablet 1   aspirin EC 81 MG EC tablet Take 1 tablet (81 mg total) by mouth daily. 30 tablet 0   digoxin (LANOXIN) 0.125 MG tablet Take 1 tablet (0.125 mg total) by mouth daily. 30 tablet 3   gabapentin (NEURONTIN) 300 MG capsule Take 300 mg by mouth 3 (three) times daily.     magnesium  oxide (MAG-OX) 400 MG tablet Take 400 mg by mouth 3 (three) times daily.     methocarbamol (ROBAXIN) 500 MG tablet Take 1 tablet (500 mg total) by mouth 3 (three) times daily. 270 tablet 0   metolazone (ZAROXOLYN) 5 MG tablet Take 1 tablet by mouth every Monday and Friday 8 tablet 3   metoprolol succinate (TOPROL-XL) 25 MG 24 hr tablet Take 0.5 tablets (12.5 mg total) by mouth 2 (two) times daily. 30 tablet 6   midodrine (PROAMATINE) 10 MG tablet Take 1 tablet (10 mg total) by mouth 3 (three) times daily with meals. 90 tablet 3   mupirocin ointment (BACTROBAN) 2 % Apply 1 application topically 3 (three) times daily.      oxyCODONE (OXY IR/ROXICODONE) 5 MG immediate release tablet Take 1 tablet (5 mg total) by mouth every 8 (eight) hours as needed for severe pain. (Patient taking differently: Take 5 mg by mouth 3 (three) times daily. ) 90 tablet 0   potassium chloride SA (KLOR-CON) 20 MEQ tablet Take 3 tablets (60 mEq total) by mouth 2 (two) times daily. 180 tablet 3   PRESCRIPTION MEDICATION See admin  instructions. CPAP- At bedtime and during any time of rest     silver sulfADIAZINE (SILVADENE) 1 % cream Apply 1 application topically daily as needed (for wound care- both legs).      spironolactone (ALDACTONE) 25 MG tablet Take 1 tablet (25 mg total) by mouth daily. 30 tablet 6   torsemide (DEMADEX) 20 MG tablet Take 5 tablets (100 mg total) by mouth 2 (two) times daily. 300 tablet 11   traZODone (DESYREL) 100 MG tablet Take 100 mg by mouth at bedtime.      warfarin (COUMADIN) 5 MG tablet Take 1 tablet (5 mg total) by mouth daily at 6 PM. 30 tablet 3   No current facility-administered medications for this encounter.     Allergies  Allergen Reactions   Feraheme [Ferumoxytol] Shortness Of Breath    SOB/Tachycardia   Penicillins Hives and Rash    Has patient had a PCN reaction causing immediate rash, facial/tongue/throat swelling, SOB or lightheadedness with hypotension: Yes Has patient had a PCN reaction causing severe rash involving mucus membranes or skin necrosis: Yes Has patient had a PCN reaction that required hospitalization unsure Has patient had a PCN reaction occurring within the last 10 years: 2010-2012 If all of the above answers are "NO", then may proceed with Cephalosporin use.   Sulfamethoxazole-Trimethoprim Other (See Comments)    CDIFF, when taking oral tablets    Tape Other (See Comments) and Itching    Skin irritation      Social History   Socioeconomic History   Marital status: Married    Spouse name: Not on file   Number of children: 0   Years of education: Not on file   Highest education level: Not on file  Occupational History   Occupation: Disabled Environmental education officer: UNEMPLOYED  Social Designer, fashion/clothing strain: Not on file   Food insecurity    Worry: Not on file    Inability: Not on file   Transportation needs    Medical: Not on file    Non-medical: Not on file  Tobacco Use   Smoking status: Former Smoker    Types:  Cigars   Smokeless tobacco: Never Used   Tobacco comment: about 3 yearly- cigar  Substance and Sexual Activity   Alcohol use: Yes    Alcohol/week: 0.0 standard drinks  Comment: social   Drug use: No   Sexual activity: Not on file  Lifestyle   Physical activity    Days per week: Not on file    Minutes per session: Not on file   Stress: Not on file  Relationships   Social connections    Talks on phone: Not on file    Gets together: Not on file    Attends religious service: Not on file    Active member of club or organization: Not on file    Attends meetings of clubs or organizations: Not on file    Relationship status: Not on file   Intimate partner violence    Fear of current or ex partner: Not on file    Emotionally abused: Not on file    Physically abused: Not on file    Forced sexual activity: Not on file  Other Topics Concern   Not on file  Social History Narrative   Not on file      Family History  Problem Relation Age of Onset   Heart disease Mother    Throat cancer Mother    Other Mother        bowel obstruction   Diabetes Father    Stroke Other    Hypertension Other    Cancer Maternal Aunt        lung, brain   Cancer Maternal Uncle        brain aneurysm   Colon cancer Neg Hx     Vitals:   07/19/19 0928  BP: 117/65  Pulse: 76  SpO2: 93%  Weight: 79.8 kg (176 lb)     PHYSICAL EXAM: General:  Appears chronically ill. Ambulated in the clinic with a walker. No resp difficulty HEENT: normal Neck: supple. JVP 6-7. Carotids 2+ bilat; no bruits. No lymphadenopathy or thryomegaly appreciated. Cor: PMI nondisplaced. Irregular rate & rhythm. No rubs, gallops. Mechanical click.  Lungs: clear Abdomen: soft, nontender, nondistended. No hepatosplenomegaly. No bruits or masses. Good bowel sounds. Extremities: no cyanosis, clubbing, rash, chronic lower extremity edema 1+  Neuro: alert & orientedx3, cranial nerves grossly intact. moves all  4 extremities w/o difficulty. Affect pleasant  ASSESSMENT & PLAN:  1.Chronic combined systolic and diastolic: Previous Echo [id number] w/ EF 50% with septal-lateral dyssynchrony (c/w LBBB), normal-appearing RV, mechanical MV with mild peri-valvular regurgitation and mean gradient 5, mechanical AoV with mean gradient 23 mmHg. He has permanentatrial fibrillation. Dysfunction of his mechanical valves does not appear to explain his worsening CHFthis year. 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 RHC 8/6/20showed mildly elevated filling pressures.He was doing well until 10/20, then developed progressive volume overload and was readmitted. Echo done after VT arrest with DCCV x 3 showed EF 30-35% with global HK, normal RV size and systolic function, mechanical MV mean gradient 5.7 and mild MR, mechanical AoV mean gradient 12, dilated IVC. It is possible fall in EF is related to stunning from VT arrest. HS-TnI was not significantly elevated. 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 Of note, PYP scan showed no evidence for ATTR amyloidosis. Valves sounds remain crisp on exam and gradients stable on echo. He is now back on po diuretics. -NHYA III. Volume status much improved. Continue Torsemide  100 mg bid + metolazone twice a week.   -Continue spironolactoneat 25 mg daily. - - Continue digoxin 0.125 daily - His BP will not tolerate Entresto/ Losartan  - He is on midodrine10 mg tidcurrently  to maintain BP, will continue for now.  - Continue  Toprol XL to 12.5 mg bid to allow more BP room. - LV lead off due to diaphragmatic stimulation and will try different vectors for LV pacing eventually. Discussed w/ EP APP again today regarding this. They will discuss further w/ Dr. Lovena Cook to determine timing.  2. Valvular heart disease: Patient has mechanical mitral and aortic valves. TEEin 8/20showed mechanical MV with mean gradient 5 mmHg, mild  peri-valvular regurgitation. The aortic valve appeared to open reasonably well but mean gradient was 23 mmHg, ?component of mild patient-prosthesis mismatch. Recent echo 10/20 showed stable gradients (lower for aortic valve).  - Continue ASA 81 and warfarin with INR goal 2.5-3.5.  3. Atrial fibrillation: Chronic. Long-standing atrial fibrillation with severely dilated atria, unlikely to have a successful cardioversion. Rate controlled. -Continue Toprol XL 12.5 mg bid to allow more BP room to push diuretics. -Coumadin per above. 4. VT: Patient with VT recent admission, rate 200s, monomorphic. He did not lose consciousness or require CPR. Cardioversion x 3 failed, he converted back to atrial fibrillation after amiodarone boluses. No recent chest pain and HS-TnI during hospitalization not significantly elevated, making ACS unlikely. He was hypokalemic and K supp. Cath in 4/16 with no significant disease. Now with [facility] Cook CRT-D device. LV lead off due to diaphragmatic stimulation and will try different vectors for LV pacing eventually.  -EP following for possible bundle pacing. -Continue amiodarone 200 mg once daily. Check TST, free t3 free t4 today.  - Advised that he will need yearly eye exams  5. CAD: Last cath in 4/16 with patent native coronaries, probably occluded SVG-RCA. Doubt CAD as cause of his recent CHF decompensation. No chest pain.  -continue medical therapy w/ ASA +  blocker 6. Type 2 diabetes:  Consider SGLT2i but hold for now.   Follow up in 2 weeks to reassess volume status and 8 weeks with Dr Lance Cook. Check BMET, TSH,free t3/free t4, BNP.    Darrick Grinder, NP 07/19/19

## 2019-07-20 ENCOUNTER — Ambulatory Visit (INDEPENDENT_AMBULATORY_CARE_PROVIDER_SITE_OTHER): Payer: Medicare HMO | Admitting: Cardiovascular Disease

## 2019-07-20 ENCOUNTER — Other Ambulatory Visit (HOSPITAL_COMMUNITY): Payer: Self-pay | Admitting: Cardiology

## 2019-07-20 DIAGNOSIS — I482 Chronic atrial fibrillation, unspecified: Secondary | ICD-10-CM

## 2019-07-20 DIAGNOSIS — Z5181 Encounter for therapeutic drug level monitoring: Secondary | ICD-10-CM | POA: Diagnosis not present

## 2019-07-20 DIAGNOSIS — Z954 Presence of other heart-valve replacement: Secondary | ICD-10-CM

## 2019-07-20 LAB — T3, FREE: T3, Free: 2.2 pg/mL (ref 2.0–4.4)

## 2019-07-20 MED ORDER — TORSEMIDE 20 MG PO TABS: 100.0000 mg | ORAL_TABLET | Freq: Two times a day (BID) | ORAL | 11 refills | Status: AC

## 2019-07-22 ENCOUNTER — Ambulatory Visit (INDEPENDENT_AMBULATORY_CARE_PROVIDER_SITE_OTHER): Payer: Medicare HMO | Admitting: Pharmacist

## 2019-07-22 DIAGNOSIS — Z5181 Encounter for therapeutic drug level monitoring: Secondary | ICD-10-CM | POA: Diagnosis not present

## 2019-07-22 DIAGNOSIS — I482 Chronic atrial fibrillation, unspecified: Secondary | ICD-10-CM | POA: Diagnosis not present

## 2019-07-22 DIAGNOSIS — Z954 Presence of other heart-valve replacement: Secondary | ICD-10-CM

## 2019-07-22 LAB — POCT INR: INR: 1.9 — AB (ref 2.0–3.0)

## 2019-07-26 ENCOUNTER — Ambulatory Visit (INDEPENDENT_AMBULATORY_CARE_PROVIDER_SITE_OTHER): Payer: Medicare HMO | Admitting: Pharmacist

## 2019-07-26 DIAGNOSIS — Z5181 Encounter for therapeutic drug level monitoring: Secondary | ICD-10-CM | POA: Diagnosis not present

## 2019-07-26 DIAGNOSIS — I482 Chronic atrial fibrillation, unspecified: Secondary | ICD-10-CM | POA: Diagnosis not present

## 2019-07-26 DIAGNOSIS — Z954 Presence of other heart-valve replacement: Secondary | ICD-10-CM

## 2019-07-26 LAB — POCT INR: INR: 2 (ref 2.0–3.0)

## 2019-08-02 ENCOUNTER — Encounter (HOSPITAL_COMMUNITY): Payer: Self-pay

## 2019-08-02 ENCOUNTER — Other Ambulatory Visit: Payer: Self-pay

## 2019-08-02 ENCOUNTER — Ambulatory Visit (INDEPENDENT_AMBULATORY_CARE_PROVIDER_SITE_OTHER): Payer: Medicare HMO | Admitting: Pharmacist

## 2019-08-02 ENCOUNTER — Ambulatory Visit (HOSPITAL_COMMUNITY)
Admission: RE | Admit: 2019-08-02 | Discharge: 2019-08-02 | Disposition: A | Discharge status: Home / Self Care | Payer: Medicare HMO | Source: Ambulatory Visit | Attending: Adult Health | Admitting: Adult Health

## 2019-08-02 VITALS — BP 110/63 | HR 64 | Wt 176.2 lb

## 2019-08-02 DIAGNOSIS — I25810 Atherosclerosis of coronary artery bypass graft(s) without angina pectoris: Secondary | ICD-10-CM

## 2019-08-02 DIAGNOSIS — Z79899 Other long term (current) drug therapy: Secondary | ICD-10-CM | POA: Insufficient documentation

## 2019-08-02 DIAGNOSIS — Z95810 Presence of automatic (implantable) cardiac defibrillator: Secondary | ICD-10-CM | POA: Diagnosis not present

## 2019-08-02 DIAGNOSIS — Z560 Unemployment, unspecified: Secondary | ICD-10-CM | POA: Insufficient documentation

## 2019-08-02 DIAGNOSIS — I5032 Chronic diastolic (congestive) heart failure: Secondary | ICD-10-CM

## 2019-08-02 DIAGNOSIS — I5042 Chronic combined systolic (congestive) and diastolic (congestive) heart failure: Secondary | ICD-10-CM | POA: Insufficient documentation

## 2019-08-02 DIAGNOSIS — Z7982 Long term (current) use of aspirin: Secondary | ICD-10-CM | POA: Insufficient documentation

## 2019-08-02 DIAGNOSIS — K5790 Diverticulosis of intestine, part unspecified, without perforation or abscess without bleeding: Secondary | ICD-10-CM | POA: Diagnosis not present

## 2019-08-02 DIAGNOSIS — F419 Anxiety disorder, unspecified: Secondary | ICD-10-CM | POA: Diagnosis not present

## 2019-08-02 DIAGNOSIS — Z7901 Long term (current) use of anticoagulants: Secondary | ICD-10-CM | POA: Insufficient documentation

## 2019-08-02 DIAGNOSIS — I482 Chronic atrial fibrillation, unspecified: Secondary | ICD-10-CM

## 2019-08-02 DIAGNOSIS — E785 Hyperlipidemia, unspecified: Secondary | ICD-10-CM | POA: Insufficient documentation

## 2019-08-02 DIAGNOSIS — I110 Hypertensive heart disease with heart failure: Secondary | ICD-10-CM | POA: Diagnosis not present

## 2019-08-02 DIAGNOSIS — Z954 Presence of other heart-valve replacement: Secondary | ICD-10-CM | POA: Diagnosis not present

## 2019-08-02 DIAGNOSIS — M1990 Unspecified osteoarthritis, unspecified site: Secondary | ICD-10-CM | POA: Insufficient documentation

## 2019-08-02 DIAGNOSIS — Z8673 Personal history of transient ischemic attack (TIA), and cerebral infarction without residual deficits: Secondary | ICD-10-CM | POA: Diagnosis not present

## 2019-08-02 DIAGNOSIS — I2510 Atherosclerotic heart disease of native coronary artery without angina pectoris: Secondary | ICD-10-CM | POA: Diagnosis not present

## 2019-08-02 DIAGNOSIS — G4733 Obstructive sleep apnea (adult) (pediatric): Secondary | ICD-10-CM | POA: Diagnosis not present

## 2019-08-02 DIAGNOSIS — E119 Type 2 diabetes mellitus without complications: Secondary | ICD-10-CM | POA: Diagnosis not present

## 2019-08-02 DIAGNOSIS — M109 Gout, unspecified: Secondary | ICD-10-CM | POA: Insufficient documentation

## 2019-08-02 DIAGNOSIS — E876 Hypokalemia: Secondary | ICD-10-CM | POA: Insufficient documentation

## 2019-08-02 DIAGNOSIS — I739 Peripheral vascular disease, unspecified: Secondary | ICD-10-CM | POA: Insufficient documentation

## 2019-08-02 DIAGNOSIS — Z8547 Personal history of malignant neoplasm of testis: Secondary | ICD-10-CM | POA: Insufficient documentation

## 2019-08-02 DIAGNOSIS — M7989 Other specified soft tissue disorders: Secondary | ICD-10-CM

## 2019-08-02 DIAGNOSIS — I472 Ventricular tachycardia: Secondary | ICD-10-CM | POA: Diagnosis not present

## 2019-08-02 DIAGNOSIS — Z951 Presence of aortocoronary bypass graft: Secondary | ICD-10-CM | POA: Insufficient documentation

## 2019-08-02 DIAGNOSIS — Z8249 Family history of ischemic heart disease and other diseases of the circulatory system: Secondary | ICD-10-CM | POA: Insufficient documentation

## 2019-08-02 DIAGNOSIS — Z9989 Dependence on other enabling machines and devices: Secondary | ICD-10-CM

## 2019-08-02 DIAGNOSIS — Z5181 Encounter for therapeutic drug level monitoring: Secondary | ICD-10-CM | POA: Diagnosis not present

## 2019-08-02 DIAGNOSIS — K219 Gastro-esophageal reflux disease without esophagitis: Secondary | ICD-10-CM | POA: Insufficient documentation

## 2019-08-02 DIAGNOSIS — Z87891 Personal history of nicotine dependence: Secondary | ICD-10-CM | POA: Insufficient documentation

## 2019-08-02 LAB — BASIC METABOLIC PANEL
Anion gap: 12 (ref 5–15)
BUN: 52 mg/dL — ABNORMAL HIGH (ref 6–20)
CO2: 30 mmol/L (ref 22–32)
Calcium: 8.3 mg/dL — ABNORMAL LOW (ref 8.9–10.3)
Chloride: 84 mmol/L — ABNORMAL LOW (ref 98–111)
Creatinine, Ser: 1.34 mg/dL — ABNORMAL HIGH (ref 0.61–1.24)
GFR calc Af Amer: >60 mL/min (ref >60)
GFR calc non Af Amer: 58 mL/min — ABNORMAL LOW (ref >60)
Glucose, Bld: 91 mg/dL (ref 70–99)
Potassium: 3.9 mmol/L (ref 3.5–5.1)
Sodium: 126 mmol/L — ABNORMAL LOW (ref 135–145)

## 2019-08-02 LAB — POCT INR: INR: 3.4 — AB (ref 2.0–3.0)

## 2019-08-02 LAB — BRAIN NATRIURETIC PEPTIDE: B Natriuretic Peptide: 167.3 pg/mL — ABNORMAL HIGH (ref 0.0–100.0)

## 2019-08-02 NOTE — Patient Instructions (Signed)
It was great to see you today! No medication changes are needed at this time.  Labs today We will only contact you if something comes back abnormal or we need to make some changes. Otherwise no news is good news!  Your physician recommends that you schedule a follow-up appointment in: 2 weeks with [name],NP  Do the following things EVERYDAY: 1) Weigh yourself in the morning before breakfast. Write it down and keep it in a log. 2) Take your medicines as prescribed 3) Eat low salt foods-Limit salt (sodium) to 2000 mg per day.  4) Stay as active as you can everyday 5) Limit all fluids for the day to less than 2 liters  At the [facility], you and your health needs are our priority. As part of our continuing mission to provide you with exceptional heart care, we have created designated Provider Care Teams. These Care Teams include your primary Cardiologist (physician) and Advanced Practice Providers (APPs- Physician Assistants and Nurse Practitioners) who all work together to provide you with the care you need, when you need it.   You may see any of the following providers on your designated Care Team at your next follow up: Marland Kitchen Dr Glori Bickers . Dr Loralie Champagne . Darrick Grinder, NP . Lyda Jester, PA   Please be sure to bring in all your medications bottles to every appointment.

## 2019-08-02 NOTE — Progress Notes (Signed)
Advanced Heart Failure Clinic Note   Referring Physician: PCP: [name], MD PCP-Cardiologist: [name], MD  [facility]: Dr. [name]  EP: Dr. [name]  HPI:  Lance Cohenis [id number] .o.with history of Bentall procedure with St Jude mechanical aortic alve and conduit in 1988. 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 This procedure was complicated by ARDS and prolonged time on the vent. He had SVG-RCA with original surgery. Last cath in 4/16 prior to MV surgery showed patent native coronaries, SVG-RCA likely occluded (never identified). He has had chronic atrial fibrillation and has history of CVA.   Initially after MV surgery, he did fairly well. 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 He had reasonable quality of life on po Lasix with occasional episodes of fluid retention that were manageable. However, starting in the Spring of 2020, he began to decompensate. He was admitted in 6/20 and was diuresed with IV Lasix, he lost 20 lbs. However, after discharge, po diuretics were not effective at managing the re-development of volume overload. He was re-admitted 04/01/19 with marked volume overload. Diuresed with lasix drip and later transitioned to torsemide 60 mg twice a day. Hospital course complicated by severe allergic reaction to feraheme. He was placed on bipap, solumedrol, Neo and given midodrine. Discharge weight was 165.5 pounds. While in the hospital, he had a TEE showing markedly dilated LA, LV with EF 50%, septal-lateral dyssynchrony, normal RV systolic function/size, mechanical MV with mean gradient 5 and mild peri-valvular regurgitation, mechanical AoV with mean gradient 23, appeared to open well.   After discharge, he developed progressive volume overload requiring outpatient diuretic dose adjustments. Torsemide was increased to 80 bid and metolazone 5 mg 2 days a week (Mon + Fri). Also on spironolactone 25 mg daily.    On 10/20, he presented to the [facility] ED w/ complaints of increased dyspnea and progressive 25 lb wt gain. Started on IV diuretics for a/c CHF.   Morning of 10/21, pt went into WCT in the 200s requiring defibrillation. ? afib w/ aberancy vs VT. Started on IV amiodarone. EP consulted.  Confirmed to be VT. Rate 200s, monomorphic. No LOC. Did not require CPR. Cardioversion x 3 failed, he converted back to atrial fibrillation after amiodarone boluses. He had no recurrent VT. Echo after VT showed reduced LVEF, 30-35% but no chest pain and HS-Tn not significantly elevated, making ACS/coronary ischemia unlikely. PYP scan showed no evidence for ATTR amyloidosis. He underwent placement of a [employer] CRT-D device on 10/26 and was continued on amiodarone.  Unfortunately, he had diaphragmatic stimulation, LV lead turned off. Per Dr. [name], will hold off on revision for now and in the future could consider Left bundle pacing if needed.   From a CHF standpoint, he was diuresed and placed back on PO diuretics. He was discharged home on torsemide 80 mg bid, metolazone 2.5 mg 2 days a week and spironolactoneat 25 mg daily. Also treated w/ digoxin and metoprolol. Due to soft BP, he required midodrine10 mg tid. His discharge wt was 176 lb.  Today he returns for HF follow up with his wife.  Overall feeling ok. Having good days and bad days. Mild SOB with exertion. Denies PND/Orthopnea. No chest pain. He continues to walk with a walker.Using CPAP every night. Appetite ok. No fever or chills. Weight at home 172-177 pounds. Says he cuts back torsemide to 80 mg twice a day about once a  Week.  Taking all medications. Followed by [facility]. Has a hard time paying for food.    Past Medical History:  Diagnosis Date   Anemia    Anxiety    ARDS (adult respiratory distress syndrome) ([place]) 01/27/2015   Arthritis    Ascending aortic dissection ([place]) 07/14/2008   Localized dissection of ascending aorta noted on CTA in  2009 and stable on CTA in 2011   Atrial fibrillation (HCC)    chronic persistent   Bell's palsy    C. difficile diarrhea    CAD (coronary artery disease)    Old scar inferior wall myoview, 10/2009 EF 52%.  He did have previous SVG to RCA but no obstructive disease noted on his most recent cath.  SVG occluded.    Cellulitis 04/02/2014   Cerebral artery occlusion with cerebral infarction ([place]) 10/08/2011   Overview:  Overview:  And hx of TIA prior to CABG, all thought due to systemic emboli prior to coumadin    Chronic diastolic heart failure ([place]) 03/01/2015   a. 12/2015: echo showing a preserved EF of 65-70%, moderate AS, and moderate TR.    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 No high-grade dysplasia    Hyperlipidemia    Hypertension    Hypogonadism male 04/08/2012   Impaired glucose tolerance 10/08/2011   Morbid obesity ([place]) 04/02/2014   OSA (obstructive sleep apnea)    CPAP   Peripheral vascular disease (HCC)    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 [id number]   33 mm St Jude bileaflet mechanical valve placed via right mini thoracotomy approach   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    1988 - Dr Blase Mess at [facility] in [place], [place]   Testicular [place]  [facility])    He was 58 y/o. He had surgical resection and rad tx's.    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     ALPRAZolam (XANAX) 0.25 MG tablet Take 1 tablet (0.25 mg total) by mouth 2 (two) times daily as needed for anxiety. 30 tablet 0   amiodarone (PACERONE) 200 MG tablet Take 1 tablet (200 mg total) by mouth 2 (two) times daily. Take 1 tablet 2 times a day for 1 week. Then take 1 tablet once daily. 60 tablet 1   aspirin EC 81 MG EC tablet Take 1 tablet (81 mg total) by mouth daily. 30 tablet 0   digoxin (LANOXIN) 0.125 MG tablet Take 1 tablet (0.125 mg  total) by mouth daily. 30 tablet 3   gabapentin (NEURONTIN) 300 MG capsule Take 300 mg by mouth 3 (three) times daily.     magnesium oxide (MAG-OX) 400 MG tablet Take 400 mg by mouth 3 (three) times daily.     methocarbamol (ROBAXIN) 500 MG tablet Take 1 tablet (500 mg total) by mouth 3 (three) times daily. 270 tablet 0   metolazone (ZAROXOLYN) 5 MG tablet Take 1 tablet by mouth every Monday and Friday 8 tablet 3   metoprolol succinate (TOPROL-XL) 25 MG 24 hr tablet Take 0.5 tablets (12.5 mg total) by mouth 2 (two) times daily. 30 tablet 6   midodrine (PROAMATINE) 10 MG tablet Take 1 tablet (10 mg total) by mouth 3 (three) times daily with meals. 90 tablet 3   mupirocin ointment (BACTROBAN) 2 % Apply 1 application topically 3 (three) times daily.      oxyCODONE (OXY IR/ROXICODONE) 5 MG immediate release tablet Take 1 tablet (5 mg total) by mouth every 8 (eight) hours as needed for severe pain. 90 tablet 0   potassium chloride SA (KLOR-CON) 20 MEQ tablet Take 3 tablets (60 mEq total) by mouth 2 (two) times daily. 180 tablet 3   PRESCRIPTION  MEDICATION See admin instructions. CPAP- At bedtime and during any time of rest     silver sulfADIAZINE (SILVADENE) 1 % cream Apply 1 application topically daily as needed (for wound care- both legs).      spironolactone (ALDACTONE) 25 MG tablet Take 1 tablet (25 mg total) by mouth daily. 30 tablet 6   torsemide (DEMADEX) 20 MG tablet Take 5 tablets (100 mg total) by mouth 2 (two) times daily. 300 tablet 11   traZODone (DESYREL) 100 MG tablet Take 100 mg by mouth at bedtime.      warfarin (COUMADIN) 5 MG tablet Take 1 tablet (5 mg total) by mouth daily at 6 PM. 30 tablet 3   No current facility-administered medications for this encounter.     Allergies  Allergen Reactions   Feraheme [Ferumoxytol] Shortness Of Breath    SOB/Tachycardia   Penicillins Hives and Rash    Has patient had a PCN reaction causing immediate rash, facial/tongue/throat swelling, SOB or lightheadedness with hypotension: Yes Has patient had a PCN reaction causing severe rash involving mucus membranes or skin necrosis: Yes Has patient had a PCN reaction that required hospitalization unsure Has patient had a PCN reaction occurring within the last 10 years: 2010-2012 If all of the above answers are "NO", then may proceed with Cephalosporin use.   Sulfamethoxazole-Trimethoprim Other (See Comments)    CDIFF, when taking oral tablets    Tape Other (See Comments) and Itching    Skin irritation      Social History   Socioeconomic History   Marital status: Married    Spouse name: Not on file   Number of children: 0   Years of education: Not on file   Highest education level: Not on file  Occupational History   Occupation: Disabled Environmental education officer: UNEMPLOYED  Social Designer, fashion/clothing strain: Not on file   Food insecurity    Worry: Not on file    Inability: Not on file   Transportation needs    Medical: Not on file    Non-medical: Not on file  Tobacco Use   Smoking status:  Former Smoker    Types: Cigars   Smokeless tobacco: Never Used   Tobacco comment: about 3 yearly- cigar  Substance  and Sexual Activity   Alcohol use: Yes    Alcohol/week: 0.0 standard drinks    Comment: social   Drug use: No   Sexual activity: Not on file  Lifestyle   Physical activity    Days per week: Not on file    Minutes per session: Not on file   Stress: Not on file  Relationships   Social connections    Talks on phone: Not on file    Gets together: Not on file    Attends religious service: Not on file    Active member of club or organization: Not on file    Attends meetings of clubs or organizations: Not on file    Relationship status: Not on file   Intimate partner violence    Fear of current or ex partner: Not on file    Emotionally abused: Not on file    Physically abused: Not on file    Forced sexual activity: Not on file  Other Topics Concern   Not on file  Social History Narrative   Not on file      Family History  Problem Relation Age of Onset   Heart disease Mother    Throat cancer Mother    Other Mother        bowel obstruction   Diabetes Father    Stroke Other    Hypertension Other    Cancer Maternal Aunt        lung, brain   Cancer Maternal Uncle        brain aneurysm   Colon cancer Neg Hx     Vitals:   08/02/19 0919  BP: 110/63  Pulse: 64  SpO2: 98%  Weight: 79.9 kg (176 lb 3.2 oz)   Wt Readings from Last 3 Encounters:  08/02/19 79.9 kg (176 lb 3.2 oz)  07/19/19 79.8 kg (176 lb)  07/11/19 88.1 kg (194 lb 3.2 oz)    PHYSICAL EXAM: General: Appears chronically ill. No resp difficulty HEENT: normal Neck: supple. no JVD. Carotids 2+ bilat; no bruits. No lymphadenopathy or thryomegaly appreciated. Cor: PMI nondisplaced. Irregular rate & rhythm. No rubs, gallops or murmurs. Left upper chest ICD scar.  Lungs: clear Abdomen: soft, nontender, nondistended. No hepatosplenomegaly. No bruits or masses. Good bowel  sounds. Extremities: no cyanosis, clubbing, rash, R and LLE trace -1+ edema Neuro: alert & orientedx3, cranial nerves grossly intact. moves all 4 extremities w/o difficulty. Affect pleasant  ASSESSMENT & PLAN:  1.Chronic combined systolic and diastolic: Previous Echo [id number] w/ EF 50% with septal-lateral dyssynchrony (c/w LBBB), normal-appearing RV, mechanical MV with mild peri-valvular regurgitation and mean gradient 5, mechanical AoV with mean gradient 23 mmHg. He has permanentatrial fibrillation. Dysfunction of his mechanical valves does not appear to explain his worsening CHFthis year. 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 RHC 8/6/20showed mildly elevated filling pressures.He was doing well until 10/20, then developed progressive volume overload and was readmitted. Echo done after VT arrest with DCCV x 3 showed EF 30-35% with global HK, normal RV size and systolic function, mechanical MV mean gradient 5.7 and mild MR, mechanical AoV mean gradient 12, dilated IVC. It is possible fall in EF is related to stunning from VT arrest. HS-TnI was not significantly elevated. 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 Of note, PYP scan showed no evidence for ATTR amyloidosis. Valves sounds remain crisp on exam and gradients stable on echo.  NYHA II-III. Volume status stable. Continue  Torsemide  100 mg bid + metolazone twice a week.  We are waiting on cardiomems approval.  -Continue spironolactoneat 25 mg daily. - - Continue digoxin 0.125 daily - His BP will not tolerate Entresto/ Losartan  - He is on midodrine10 mg tidcurrently to maintain BP. Continue   - Continue  Toprol XL to 12.5 mg bid to allow more BP room. - LV lead off due to diaphragmatic stimulation and will try different vectors for LV pacing eventually. Discussed w/ EP APP again today regarding this. They will discuss further w/ Dr. [name] to determine timing.  - Discussed  cardiomems.  2. Valvular heart disease: Patient has mechanical mitral and aortic valves. TEEin 8/20showed mechanical MV with mean gradient 5 mmHg, mild peri-valvular regurgitation. The aortic valve appeared to open reasonably well but mean gradient was 23 mmHg, ?component of mild patient-prosthesis mismatch. Recent echo 10/20 showed stable gradients (lower for aortic valve).  - Continue ASA 81 and warfarin with INR goal 2.5-3.5.  3. Atrial fibrillation: Chronic. Long-standing atrial fibrillation with severely dilated atria, unlikely to have a successful cardioversion. Rate controled.  -Continue Toprol XL 12.5 mg bid to allow more BP room to push diuretics. -Coumadin per above. 4. VT: Patient with VT recent admission, rate 200s, monomorphic. He did not lose consciousness or require CPR. Cardioversion x 3 failed, he converted back to atrial fibrillation after amiodarone boluses. No recent chest pain and HS-TnI during hospitalization not significantly elevated, making ACS unlikely. He was hypokalemic and K supp. Cath in 4/16 with no significant disease. Now with [facility] Scientific CRT-D device. LV lead off due to diaphragmatic stimulation and will try different vectors for LV pacing eventually.  -EP following for possible bundle pacing. -Continue amiodarone 200 mg once daily. Check TST, free t3 free t4 today.  - Advised that he will need yearly eye exams  5. CAD: Last cath in 4/16 with patent native coronaries, probably occluded SVG-RCA. Doubt CAD as cause of his recent CHF decompensation. No chest pain.  -continue medical therapy w/ ASA +  blocker 6. Type 2 diabetes:  Consider SGLT2i but hold for now.   Follow up in 2 weeks then 8 weeks with Dr [name]. Referred to HFSW for assistance with food.      Darrick Grinder, NP 08/02/19

## 2019-08-02 NOTE — Progress Notes (Signed)
CSW informed by provider that pt and wife having trouble affording food.  They state after they get their check each month and pay their bills they are unable to afford enough food.  CSW provided with [employer] giftcard to help with groceries immediately.  CSW also provided pt with list of local food pantries and a referral to [employer].  CSW spoke with [facility] representative who confirms both patient and wife are eligible for [employer] program to provide 75meals/week for 4 weeks- can be reassessed at the end of that time for further food deliveries.  Referrals completed and submitted to [employer]- should receive first delivery within 3-4 business days.  CSW will continue to follow and assist as needed  Jorge Ny, [facility] Desk#: [phone] Cell#: [phone]

## 2019-08-09 ENCOUNTER — Ambulatory Visit (INDEPENDENT_AMBULATORY_CARE_PROVIDER_SITE_OTHER): Payer: Medicare HMO | Admitting: Cardiology

## 2019-08-09 ENCOUNTER — Telehealth (HOSPITAL_COMMUNITY): Payer: Self-pay

## 2019-08-09 DIAGNOSIS — Z5181 Encounter for therapeutic drug level monitoring: Secondary | ICD-10-CM | POA: Diagnosis not present

## 2019-08-09 DIAGNOSIS — I482 Chronic atrial fibrillation, unspecified: Secondary | ICD-10-CM | POA: Diagnosis not present

## 2019-08-09 DIAGNOSIS — Z954 Presence of other heart-valve replacement: Secondary | ICD-10-CM | POA: Diagnosis not present

## 2019-08-09 LAB — POCT INR: INR: 2 (ref 2.0–3.0)

## 2019-08-09 MED ORDER — METOPROLOL SUCCINATE ER 25 MG PO TB24: 12.5000 mg | ORAL_TABLET | Freq: Every day | ORAL | 6 refills | Status: AC

## 2019-08-09 MED ORDER — METOLAZONE 5 MG PO TABS: ORAL_TABLET | ORAL | 3 refills | Status: AC

## 2019-08-09 NOTE — Telephone Encounter (Signed)
See PA NOTE : Can reduce metoprolol succinate (Toprol XL) to 12.5 mg once daily for low HR. Increase metolazone from 2 to 3 days a week (MWF). Keep F/u appt next week. If HR remains low, may need to reduce amiodarone down from 200 mg daily to 100 mg daily. Will decide at clinic f/u.    Pt made aware of instructions to only take metoprolol once a day and increase metolazone to 3 days a week. Advised will keep next weeks appt to address further if symptoms continue.  Verbalized understanding.

## 2019-08-09 NOTE — Patient Instructions (Signed)
Description   Amiodarone 10/21 via load then po at home. Spoke with Glenard Haring with [facility] - advised to have pt take 1 tablet today, then start taking 1/2 tablet daily except 1 tablet on Monday, Wednesdays, and Fridays. Recheck in 1 week.  Coumadin Clinic [phone]. Aimee [phone]

## 2019-08-09 NOTE — Telephone Encounter (Signed)
Received call from [facility] from [facility] stating patient have significant weight increase since last week.  Last Wednesday he was 171.3, today he is 178.8.  He has lower extremity edema of left leg increased by 1 cm when compared to last week. Pt does not have any complaints however his wife notes he has increasing lethargy in last few days and some mild intermittent dizziness.  His BP today is 105/70, HR 41-43 Radial, 58 Apical as per RN.  Blood sugars are within his normal range however they do not check consistently.  They do note that he missed yesterdays afternoon doses of all meds. He took them this morning. Please advise as wife is concerned about lethargy and his HR lower than normal.  Nurse noted that he seemed at his baseline today.

## 2019-08-16 ENCOUNTER — Ambulatory Visit (HOSPITAL_BASED_OUTPATIENT_CLINIC_OR_DEPARTMENT_OTHER)
Admission: RE | Admit: 2019-08-16 | Discharge: 2019-08-16 | Disposition: A | Discharge status: Home / Self Care | Payer: Medicare HMO | Source: Ambulatory Visit | Attending: Adult Health | Admitting: Adult Health

## 2019-08-16 ENCOUNTER — Encounter (HOSPITAL_COMMUNITY): Payer: Self-pay

## 2019-08-16 ENCOUNTER — Other Ambulatory Visit: Payer: Self-pay

## 2019-08-16 ENCOUNTER — Ambulatory Visit (INDEPENDENT_AMBULATORY_CARE_PROVIDER_SITE_OTHER): Payer: Medicare HMO | Admitting: *Deleted

## 2019-08-16 VITALS — BP 136/63 | HR 62 | Wt 186.8 lb

## 2019-08-16 DIAGNOSIS — I5032 Chronic diastolic (congestive) heart failure: Secondary | ICD-10-CM | POA: Diagnosis not present

## 2019-08-16 DIAGNOSIS — I25810 Atherosclerosis of coronary artery bypass graft(s) without angina pectoris: Secondary | ICD-10-CM

## 2019-08-16 DIAGNOSIS — Z5181 Encounter for therapeutic drug level monitoring: Secondary | ICD-10-CM

## 2019-08-16 DIAGNOSIS — Z9989 Dependence on other enabling machines and devices: Secondary | ICD-10-CM

## 2019-08-16 DIAGNOSIS — Z801 Family history of malignant neoplasm of trachea, bronchus and lung: Secondary | ICD-10-CM | POA: Insufficient documentation

## 2019-08-16 DIAGNOSIS — Z882 Allergy status to sulfonamides status: Secondary | ICD-10-CM | POA: Insufficient documentation

## 2019-08-16 DIAGNOSIS — Z800 Family history of malignant neoplasm of digestive organs: Secondary | ICD-10-CM | POA: Insufficient documentation

## 2019-08-16 DIAGNOSIS — Z7982 Long term (current) use of aspirin: Secondary | ICD-10-CM | POA: Insufficient documentation

## 2019-08-16 DIAGNOSIS — Z8547 Personal history of malignant neoplasm of testis: Secondary | ICD-10-CM | POA: Insufficient documentation

## 2019-08-16 DIAGNOSIS — M1990 Unspecified osteoarthritis, unspecified site: Secondary | ICD-10-CM | POA: Insufficient documentation

## 2019-08-16 DIAGNOSIS — I2510 Atherosclerotic heart disease of native coronary artery without angina pectoris: Secondary | ICD-10-CM | POA: Insufficient documentation

## 2019-08-16 DIAGNOSIS — Z87891 Personal history of nicotine dependence: Secondary | ICD-10-CM | POA: Insufficient documentation

## 2019-08-16 DIAGNOSIS — Z833 Family history of diabetes mellitus: Secondary | ICD-10-CM | POA: Insufficient documentation

## 2019-08-16 DIAGNOSIS — Z952 Presence of prosthetic heart valve: Secondary | ICD-10-CM | POA: Insufficient documentation

## 2019-08-16 DIAGNOSIS — F419 Anxiety disorder, unspecified: Secondary | ICD-10-CM | POA: Insufficient documentation

## 2019-08-16 DIAGNOSIS — Z8673 Personal history of transient ischemic attack (TIA), and cerebral infarction without residual deficits: Secondary | ICD-10-CM | POA: Insufficient documentation

## 2019-08-16 DIAGNOSIS — I482 Chronic atrial fibrillation, unspecified: Secondary | ICD-10-CM | POA: Insufficient documentation

## 2019-08-16 DIAGNOSIS — Z79899 Other long term (current) drug therapy: Secondary | ICD-10-CM | POA: Insufficient documentation

## 2019-08-16 DIAGNOSIS — M109 Gout, unspecified: Secondary | ICD-10-CM | POA: Insufficient documentation

## 2019-08-16 DIAGNOSIS — I110 Hypertensive heart disease with heart failure: Secondary | ICD-10-CM | POA: Insufficient documentation

## 2019-08-16 DIAGNOSIS — Z9079 Acquired absence of other genital organ(s): Secondary | ICD-10-CM | POA: Insufficient documentation

## 2019-08-16 DIAGNOSIS — I5042 Chronic combined systolic (congestive) and diastolic (congestive) heart failure: Secondary | ICD-10-CM | POA: Insufficient documentation

## 2019-08-16 DIAGNOSIS — I340 Nonrheumatic mitral (valve) insufficiency: Secondary | ICD-10-CM | POA: Insufficient documentation

## 2019-08-16 DIAGNOSIS — Z954 Presence of other heart-valve replacement: Secondary | ICD-10-CM

## 2019-08-16 DIAGNOSIS — I472 Ventricular tachycardia: Secondary | ICD-10-CM | POA: Insufficient documentation

## 2019-08-16 DIAGNOSIS — G4733 Obstructive sleep apnea (adult) (pediatric): Secondary | ICD-10-CM | POA: Insufficient documentation

## 2019-08-16 DIAGNOSIS — E1151 Type 2 diabetes mellitus with diabetic peripheral angiopathy without gangrene: Secondary | ICD-10-CM | POA: Insufficient documentation

## 2019-08-16 DIAGNOSIS — E875 Hyperkalemia: Secondary | ICD-10-CM | POA: Diagnosis not present

## 2019-08-16 DIAGNOSIS — Z7901 Long term (current) use of anticoagulants: Secondary | ICD-10-CM | POA: Insufficient documentation

## 2019-08-16 DIAGNOSIS — Z808 Family history of malignant neoplasm of other organs or systems: Secondary | ICD-10-CM | POA: Insufficient documentation

## 2019-08-16 DIAGNOSIS — Z8249 Family history of ischemic heart disease and other diseases of the circulatory system: Secondary | ICD-10-CM | POA: Insufficient documentation

## 2019-08-16 DIAGNOSIS — Z880 Allergy status to penicillin: Secondary | ICD-10-CM | POA: Insufficient documentation

## 2019-08-16 DIAGNOSIS — E876 Hypokalemia: Secondary | ICD-10-CM | POA: Insufficient documentation

## 2019-08-16 DIAGNOSIS — Z888 Allergy status to other drugs, medicaments and biological substances status: Secondary | ICD-10-CM | POA: Insufficient documentation

## 2019-08-16 LAB — POCT INR: INR: 2.1 (ref 2.0–3.0)

## 2019-08-16 NOTE — Patient Instructions (Signed)
Description   Amiodarone 10/21 via load then po at home. Spoke with Amy LPN with [facility]-advised to have pt take 1 tablet today, then start taking 1 tablet daily except 1/2 tablet on Sunday, Tuesday, and Thursday.  Recheck in 1 week.  Coumadin Clinic [phone]. [place] [phone]

## 2019-08-16 NOTE — Patient Instructions (Signed)
Be sure to take a Metolazone today 08/16/19  Be sure to limit fluid intake to LESS THAN 2Liters (67 ounces) daily  Your physician recommends that you schedule a follow-up appointment in: 2-3 weeks  in the Advanced Practitioners (PA/NP) Clinic   Do the following things EVERYDAY: 1) Weigh yourself in the morning before breakfast. Write it down and keep it in a log. 2) Take your medicines as prescribed 3) Eat low salt foods--Limit salt (sodium) to 2000 mg per day.  4) Stay as active as you can everyday 5) Limit all fluids for the day to less than 2 liters  At the [facility], you and your health needs are our priority. As part of our continuing mission to provide you with exceptional heart care, we have created designated Provider Care Teams. These Care Teams include your primary Cardiologist (physician) and Advanced Practice Providers (APPs- Physician Assistants and Nurse Practitioners) who all work together to provide you with the care you need, when you need it.   You may see any of the following providers on your designated Care Team at your next follow up: Marland Kitchen Dr Glori Bickers . Dr Loralie Champagne . Darrick Grinder, NP . Lyda Jester, PA . Audry Riles, PharmD   Please be sure to bring in all your medications bottles to every appointment.   Marland Kitchen

## 2019-08-16 NOTE — Progress Notes (Signed)
Advanced Heart Failure Clinic Note   PCP: [name], MD PCP-Cardiologist: [name], MD  [facility]: Dr. [name]  EP: Dr. [name]  HPI:  Lance Cohenis a58 .o.with history of Bentall procedure with St Jude mechanical aortic alve and conduit in 1988. 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 This procedure was complicated by ARDS and prolonged time on the vent. He had SVG-RCA with original surgery. Last cath in 4/16 prior to MV surgery showed patent native coronaries, SVG-RCA likely occluded (never identified). He has had chronic atrial fibrillation and has history of CVA.   Initially after MV surgery, he did fairly well. 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 He had reasonable quality of life on po Lasix with occasional episodes of fluid retention that were manageable. However, starting in the Spring of 2020, he began to decompensate. He was admitted in 6/20 and was diuresed with IV Lasix, he lost 20 lbs. However, after discharge, po diuretics were not effective at managing the re-development of volume overload. He was re-admitted 04/01/19 with marked volume overload. Diuresed with lasix drip and later transitioned to torsemide 60 mg twice a day. Hospital course complicated by severe allergic reaction to feraheme. He was placed on bipap, solumedrol, Neo and given midodrine. Discharge weight was 165.5 pounds. While in the hospital, he had a TEE showing markedly dilated LA, LV with EF 50%, septal-lateral dyssynchrony, normal RV systolic function/size, mechanical MV with mean gradient 5 and mild peri-valvular regurgitation, mechanical AoV with mean gradient 23, appeared to open well.   After discharge, he developed progressive volume overload requiring outpatient diuretic dose adjustments. Torsemide was increased to 80 bid and metolazone 5 mg 2 days a week (Mon + Fri). Also on spironolactone 25 mg daily.   On 10/20, he  presented to the [facility] ED w/ complaints of increased dyspnea and progressive 25 lb wt gain. Started on IV diuretics for a/c CHF.   Morning of 10/21, pt went into WCT in the 200s requiring defibrillation. ? afib w/ aberancy vs VT. Started on IV amiodarone. EP consulted.  Confirmed to be VT. Rate 200s, monomorphic. No LOC. Did not require CPR. Cardioversion x 3 failed, he converted back to atrial fibrillation after amiodarone boluses. He had no recurrent VT. Echo after VT showed reduced LVEF, 30-35% but no chest pain and HS-Tn not significantly elevated, making ACS/coronary ischemia unlikely. PYP scan showed no evidence for ATTR amyloidosis. He underwent placement of a [employer] CRT-D device on 10/26 and was continued on amiodarone.  Unfortunately, he had diaphragmatic stimulation, LV lead turned off. Per Dr. [name], will hold off on revision for now and in the future could consider Left bundle pacing if needed.   From a CHF standpoint, he was diuresed and placed back on PO diuretics. He was discharged home on torsemide 80 mg bid, metolazone 2.5 mg 2 days a week and spironolactoneat 25 mg daily. Also treated w/ digoxin and metoprolol. Due to soft BP, he required midodrine10 mg tid. His discharge wt was 176 lb.  Today he returns for HF follow up with his wife. Overall feeling fair. Says he has leg swelling when he walks. He does not tolerate compression stockings. SOB with exertion. Using an electric cart in the grocery store. Denies PND/Orthopnea. No bleeding issues. Appetite ok. Says he has been eating [employer] and he thinks that is has lots of sodium. Drinking lots fluids and eating ice all day.  No fever or chills. Weight at home 177-188  pounds. Taking all medications.   Past Medical History:  Diagnosis Date  . Anemia   . Anxiety   . ARDS (adult respiratory distress syndrome) ([place]) 01/27/2015  . Arthritis   . Ascending aortic dissection ([place]) 07/14/2008   Localized dissection of ascending  aorta noted on CTA in 2009 and stable on CTA in 2011  . Atrial fibrillation ([facility])    chronic persistent  . Bell's palsy   . C. difficile diarrhea   . CAD (coronary artery disease)    Old scar inferior wall myoview, 10/2009 EF 52%.  He did have previous SVG to RCA but no obstructive disease noted on his most recent cath.  SVG occluded.   . Cellulitis 04/02/2014  . Cerebral artery occlusion with cerebral infarction ([place]) 10/08/2011   Overview:  Overview:  And hx of TIA prior to CABG, all thought due to systemic emboli prior to coumadin   . Chronic diastolic heart failure ([place]) 03/01/2015   a. 12/2015: echo showing a preserved EF of 65-70%, moderate AS, and moderate TR.   Marland Kitchen CVA (cerebral vascular accident) ([place]) 10/08/2011   And hx of TIA prior to CABG, all thought due to systemic emboli prior to coumadin   . DIABETES MELLITUS, TYPE II 11/01/2009   Qualifier: Diagnosis of  By: [name] MD, [name]    . Diverticulosis   . 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 Gastric polyps   . GERD (gastroesophageal reflux disease)    not needing medication at thhis time- 01/22/15  . Gout   . Hiatal hernia   . History of colon polyps 12/23/2011   Overview:  Overview:  Colonoscopy January 2010, 6 mm rectal tubulovillous adenoma. No high-grade dysplasia   . Hyperlipidemia   . Hypertension   . Hypogonadism male 04/08/2012  . Impaired glucose tolerance 10/08/2011  . Morbid obesity ([place]) 04/02/2014  . OSA (obstructive sleep apnea)    CPAP  . Peripheral vascular disease ([place])   . 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 [id number]   33 mm St Jude bileaflet mechanical valve placed via right mini thoracotomy approach  . 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 Testicular cancer [facility])    He was 58 y/o. He had surgical resection and rad tx's.   Marland Kitchen TIA (transient ischemic attack)    age 23  . Tubulovillous adenoma of colon   . 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 Venous (peripheral) insufficiency 02/03/2005    Current Outpatient Medications  Medication Sig Dispense Refill  . allopurinol (ZYLOPRIM) 300 MG tablet Take 300 mg by mouth daily.    Marland Kitchen ALPRAZolam (XANAX) 0.25 MG tablet Take 1 tablet (0.25 mg total) by mouth 2 (two) times daily as needed for anxiety. 30 tablet 0  . amiodarone (PACERONE) 200 MG tablet Take 1 tablet (200 mg total) by mouth 2 (two) times daily. Take 1 tablet 2 times a day for 1 week. Then take 1 tablet once daily. 60 tablet 1  . aspirin EC 81 MG EC tablet Take 1 tablet (81 mg total) by mouth daily. 30 tablet 0  . digoxin (LANOXIN) 0.125 MG tablet Take 1 tablet (0.125 mg  total) by mouth daily. 30 tablet 3  . gabapentin (NEURONTIN) 300 MG capsule Take 300 mg by mouth 3 (three) times daily.    . magnesium oxide (MAG-OX) 400 MG tablet Take 400 mg by mouth 3 (three) times daily.    . methocarbamol (ROBAXIN) 500 MG tablet Take 1 tablet (500 mg total) by mouth 3 (three) times daily. 270 tablet 0  . metolazone (ZAROXOLYN) 5 MG tablet Take 1 tablet by mouth every Monday, Wednesday, and Friday 12 tablet 3  . metoprolol succinate (TOPROL-XL) 25 MG 24 hr tablet Take 0.5 tablets (12.5 mg total) by mouth daily. 15 tablet 6  . midodrine (PROAMATINE) 10 MG tablet Take 1 tablet (10 mg total) by mouth 3 (three) times daily with meals. 90 tablet 3  . mupirocin ointment (BACTROBAN) 2 % Apply 1 application topically 3 (three) times daily.     Marland Kitchen oxyCODONE (OXY IR/ROXICODONE) 5 MG immediate release tablet Take 1 tablet (5 mg total) by mouth every 8 (eight) hours as needed for severe pain. 90 tablet 0  . potassium chloride SA (KLOR-CON) 20 MEQ tablet Take 3 tablets (60 mEq total) by mouth 2 (two) times daily. 180 tablet 3  .  PRESCRIPTION MEDICATION See admin instructions. CPAP- At bedtime and during any time of rest    . silver sulfADIAZINE (SILVADENE) 1 % cream Apply 1 application topically daily as needed (for wound care- both legs).     Marland Kitchen spironolactone (ALDACTONE) 25 MG tablet Take 1 tablet (25 mg total) by mouth daily. 30 tablet 6  . torsemide (DEMADEX) 20 MG tablet Take 5 tablets (100 mg total) by mouth 2 (two) times daily. 300 tablet 11  . traZODone (DESYREL) 100 MG tablet Take 100 mg by mouth at bedtime.     Marland Kitchen warfarin (COUMADIN) 5 MG tablet Take 1 tablet (5 mg total) by mouth daily at 6 PM. 30 tablet 3   No current facility-administered medications for this encounter.    Allergies  Allergen Reactions  . Feraheme [Ferumoxytol] Shortness Of Breath    SOB/Tachycardia  . Penicillins Hives and Rash    Has patient had a PCN reaction causing immediate rash, facial/tongue/throat swelling, SOB or lightheadedness with hypotension: Yes Has patient had a PCN reaction causing severe rash involving mucus membranes or skin necrosis: Yes Has patient had a PCN reaction that required hospitalization unsure Has patient had a PCN reaction occurring within the last 10 years: 2010-2012 If all of the above answers are "NO", then may proceed with Cephalosporin use.  [name] Other (See Comments)    CDIFF, when taking oral tablets   . Tape Other (See Comments) and Itching    Skin irritation      Social History   Socioeconomic History  . Marital status: Married    Spouse name: Not on file  . Number of children: 0  . Years of education: Not on file  . Highest education level: Not on file  Occupational History  . Occupation: Disabled Environmental education officer: UNEMPLOYED  Tobacco Use  . Smoking status: Former Smoker    Types: Cigars  . Smokeless tobacco: Never Used  . Tobacco comment: about 3 yearly- cigar  Substance and Sexual Activity  . Alcohol use: Yes    Alcohol/week: 0.0 standard drinks     Comment: social  . Drug use: No  . Sexual activity: Not on file  Other Topics Concern  . Not on file  Social History Narrative  . Not on file  Social Determinants of Health   Financial Resource Strain:   . Difficulty of Paying Living Expenses: Not on file  Food Insecurity:   . Worried About Charity fundraiser in the Last Year: Not on file  . Ran Out of Food in the Last Year: Not on file  Transportation Needs:   . Lack of Transportation (Medical): Not on file  . Lack of Transportation (Non-Medical): Not on file  Physical Activity:   . Days of Exercise per Week: Not on file  . Minutes of Exercise per Session: Not on file  Stress:   . Feeling of Stress : Not on file  Social Connections:   . Frequency of Communication with Friends and Family: Not on file  . Frequency of Social Gatherings with Friends and Family: Not on file  . Attends Religious Services: Not on file  . Active Member of Clubs or Organizations: Not on file  . Attends Archivist Meetings: Not on file  . Marital Status: Not on file  Intimate Partner Violence:   . Fear of Current or Ex-Partner: Not on file  . Emotionally Abused: Not on file  . Physically Abused: Not on file  . Sexually Abused: Not on file      Family History  Problem Relation Age of Onset  . Heart disease Mother   . Throat cancer Mother   . Other Mother        bowel obstruction  . Diabetes Father   . Stroke Other   . Hypertension Other   . Cancer Maternal Aunt        lung, brain  . Cancer Maternal Uncle        brain aneurysm  . Colon cancer Neg Hx     Vitals:   08/16/19 1418  BP: 136/63  Pulse: 62  SpO2: 96%  Weight: 84.7 kg (186 lb 12.8 oz)   Wt Readings from Last 3 Encounters:  08/16/19 84.7 kg (186 lb 12.8 oz)  08/02/19 79.9 kg (176 lb 3.2 oz)  07/19/19 79.8 kg (176 lb)    PHYSICAL EXAM: General:  Appears chronically ill. Arrived in a wheel chair.  No resp difficulty HEENT: normal Neck: supple. JVP to  jaw . Carotids 2+ bilat; no bruits. No lymphadenopathy or thryomegaly appreciated. Cor: PMI nondisplaced. Irregular rate & rhythm. No rubs, gallops> mechanical S1. 2/6 SEM   Lungs: clear Abdomen: soft, nontender, nondistended. No hepatosplenomegaly. No bruits or masses. Good bowel sounds. Extremities: no cyanosis, clubbing, rash, R and LLE 2+ edema. Upper extremities excoriated.  Neuro: alert & orientedx3, cranial nerves grossly intact. moves all 4 extremities w/o difficulty. Affect flat.   ASSESSMENT & PLAN:  1.Chronic combined systolic and diastolic: Previous Echo [id number] w/ EF 50% with septal-lateral dyssynchrony (c/w LBBB), normal-appearing RV, mechanical MV with mild peri-valvular regurgitation and mean gradient 5, mechanical AoV with mean gradient 23 mmHg. He has permanentatrial fibrillation. Dysfunction of his mechanical valves does not appear to explain his worsening CHFthis year. 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 RHC 8/6/20showed mildly elevated filling pressures.He was doing well until 10/20, then developed progressive volume overload and was readmitted. Echo done after VT arrest with DCCV x 3 showed EF 30-35% with global HK, normal RV size and systolic function, mechanical MV mean gradient 5.7 and mild MR, mechanical AoV mean gradient 12, dilated IVC. It is possible fall in EF is related to stunning from VT arrest. HS-TnI was not significantly elevated. He is now s/p [employer]  CRT-D placement on 10/26, LV lead is off. Of note, PYP scan showed no evidence for ATTR amyloidosis. Valves sounds remain crisp on exam and gradients stable on echo.  NYHA III. Volume status elevated likely in the setting if increased fluid intake and high sodium diet. Discussed limiting fluid intake to < 2 liters per day and to avoid high sodium diet.   Continue Torsemide  100 mg bid + metolazone three times a week. He was instructed to take an extra metolazone  tonight.   -Continue spironolactoneat 25 mg daily. - - Continue digoxin 0.125 daily - His BP will not tolerate Entresto/ Losartan  - He is on midodrine10 mg tidcurrently to maintain BP. Continue   - Continue  Toprol XL to 12.5 mg bid to allow more BP room. - LV lead off due to diaphragmatic stimulation and will try different vectors for LV pacing eventually. Discussed w/ EP APP. They will discuss further w/ Dr. [name] to determine timing.  - Discussed cardiomems. Currently in the Appeal process.  2. Valvular heart disease: Patient has mechanical mitral and aortic valves. TEEin 8/20showed mechanical MV with mean gradient 5 mmHg, mild peri-valvular regurgitation. The aortic valve appeared to open reasonably well but mean gradient was 23 mmHg, ?component of mild patient-prosthesis mismatch. Recent echo 10/20 showed stable gradients (lower for aortic valve).  - Continue ASA 81 and warfarin with INR goal 2.5-3.5.  3. Atrial fibrillation: Chronic. Long-standing atrial fibrillation with severely dilated atria, unlikely to have a successful cardioversion. Rate controlled.  -Continue Toprol XL 12.5 mg bid. Would not increase/  -Coumadin per above. 4. VT: Patient with VT recent admission, rate 200s, monomorphic. He did not lose consciousness or require CPR. Cardioversion x 3 failed, he converted back to atrial fibrillation after amiodarone boluses. No recent chest pain and HS-TnI during hospitalization not significantly elevated, making ACS unlikely. He was hypokalemic and K supp. Cath in 4/16 with no significant disease. Now with [facility] Scientific CRT-D device. LV lead off due to diaphragmatic stimulation and will try different vectors for LV pacing eventually.  -EP following for possible bundle pacing. -Continue amiodarone 200 mg once daily. Recent TSH- Advised that he will need yearly eye exams  5. CAD: Last cath in 4/16 with patent native coronaries, probably occluded SVG-RCA. Doubt CAD as  cause of his recent CHF decompensation. No chest pain.  -continue medical therapy w/ ASA +  blocker 6. Type 2 diabetes:  Consider SGLT2i but hold for now. Per PCP.   Discussed at length low sodium food choices and to limit fluid intake to < 64 ounces per day.   Referred to HFSW for coping strategies.   Darrick Grinder, NP 08/16/19

## 2019-08-18 ENCOUNTER — Other Ambulatory Visit: Payer: Self-pay

## 2019-08-18 ENCOUNTER — Inpatient Hospital Stay (HOSPITAL_COMMUNITY): Payer: Medicare HMO

## 2019-08-18 ENCOUNTER — Telehealth: Payer: Self-pay | Admitting: Internal Medicine

## 2019-08-18 ENCOUNTER — Emergency Department (HOSPITAL_COMMUNITY): Payer: Medicare HMO

## 2019-08-18 ENCOUNTER — Encounter (HOSPITAL_COMMUNITY): Payer: Self-pay

## 2019-08-18 ENCOUNTER — Inpatient Hospital Stay (HOSPITAL_COMMUNITY)
Admission: EM | Admit: 2019-08-18 | Discharge: 2019-10-03 | DRG: 981 | Disposition: E | Payer: Medicare HMO | Attending: Pulmonary Disease | Admitting: Pulmonary Disease

## 2019-08-18 ENCOUNTER — Telehealth (HOSPITAL_COMMUNITY): Payer: Self-pay | Admitting: *Deleted

## 2019-08-18 DIAGNOSIS — I428 Other cardiomyopathies: Secondary | ICD-10-CM | POA: Diagnosis present

## 2019-08-18 DIAGNOSIS — R001 Bradycardia, unspecified: Secondary | ICD-10-CM | POA: Diagnosis not present

## 2019-08-18 DIAGNOSIS — R64 Cachexia: Secondary | ICD-10-CM | POA: Diagnosis present

## 2019-08-18 DIAGNOSIS — R5381 Other malaise: Secondary | ICD-10-CM | POA: Diagnosis present

## 2019-08-18 DIAGNOSIS — Z87891 Personal history of nicotine dependence: Secondary | ICD-10-CM

## 2019-08-18 DIAGNOSIS — E875 Hyperkalemia: Principal | ICD-10-CM | POA: Diagnosis present

## 2019-08-18 DIAGNOSIS — Z833 Family history of diabetes mellitus: Secondary | ICD-10-CM

## 2019-08-18 DIAGNOSIS — D689 Coagulation defect, unspecified: Secondary | ICD-10-CM | POA: Diagnosis present

## 2019-08-18 DIAGNOSIS — L89150 Pressure ulcer of sacral region, unstageable: Secondary | ICD-10-CM | POA: Diagnosis present

## 2019-08-18 DIAGNOSIS — S1093XA Contusion of unspecified part of neck, initial encounter: Secondary | ICD-10-CM | POA: Diagnosis not present

## 2019-08-18 DIAGNOSIS — J189 Pneumonia, unspecified organism: Secondary | ICD-10-CM

## 2019-08-18 DIAGNOSIS — Z7982 Long term (current) use of aspirin: Secondary | ICD-10-CM

## 2019-08-18 DIAGNOSIS — E43 Unspecified severe protein-calorie malnutrition: Secondary | ICD-10-CM | POA: Diagnosis not present

## 2019-08-18 DIAGNOSIS — A419 Sepsis, unspecified organism: Secondary | ICD-10-CM | POA: Diagnosis not present

## 2019-08-18 DIAGNOSIS — I4821 Permanent atrial fibrillation: Secondary | ICD-10-CM | POA: Diagnosis present

## 2019-08-18 DIAGNOSIS — Z79899 Other long term (current) drug therapy: Secondary | ICD-10-CM

## 2019-08-18 DIAGNOSIS — T460X5A Adverse effect of cardiac-stimulant glycosides and drugs of similar action, initial encounter: Secondary | ICD-10-CM | POA: Diagnosis present

## 2019-08-18 DIAGNOSIS — I2510 Atherosclerotic heart disease of native coronary artery without angina pectoris: Secondary | ICD-10-CM | POA: Diagnosis present

## 2019-08-18 DIAGNOSIS — Z9049 Acquired absence of other specified parts of digestive tract: Secondary | ICD-10-CM

## 2019-08-18 DIAGNOSIS — T502X5A Adverse effect of carbonic-anhydrase inhibitors, benzothiadiazides and other diuretics, initial encounter: Secondary | ICD-10-CM | POA: Diagnosis present

## 2019-08-18 DIAGNOSIS — K219 Gastro-esophageal reflux disease without esophagitis: Secondary | ICD-10-CM | POA: Diagnosis present

## 2019-08-18 DIAGNOSIS — Z20822 Contact with and (suspected) exposure to covid-19: Secondary | ICD-10-CM | POA: Diagnosis present

## 2019-08-18 DIAGNOSIS — Z978 Presence of other specified devices: Secondary | ICD-10-CM

## 2019-08-18 DIAGNOSIS — Z66 Do not resuscitate: Secondary | ICD-10-CM | POA: Diagnosis not present

## 2019-08-18 DIAGNOSIS — G9340 Encephalopathy, unspecified: Secondary | ICD-10-CM

## 2019-08-18 DIAGNOSIS — T82524A Displacement of infusion catheter, initial encounter: Secondary | ICD-10-CM | POA: Diagnosis present

## 2019-08-18 DIAGNOSIS — N1831 Chronic kidney disease, stage 3a: Secondary | ICD-10-CM

## 2019-08-18 DIAGNOSIS — E785 Hyperlipidemia, unspecified: Secondary | ICD-10-CM | POA: Diagnosis present

## 2019-08-18 DIAGNOSIS — J9600 Acute respiratory failure, unspecified whether with hypoxia or hypercapnia: Secondary | ICD-10-CM

## 2019-08-18 DIAGNOSIS — Z4659 Encounter for fitting and adjustment of other gastrointestinal appliance and device: Secondary | ICD-10-CM

## 2019-08-18 DIAGNOSIS — I1310 Hypertensive heart and chronic kidney disease without heart failure, with stage 1 through stage 4 chronic kidney disease, or unspecified chronic kidney disease: Secondary | ICD-10-CM | POA: Diagnosis not present

## 2019-08-18 DIAGNOSIS — N183 Chronic kidney disease, stage 3 unspecified: Secondary | ICD-10-CM | POA: Diagnosis present

## 2019-08-18 DIAGNOSIS — G9341 Metabolic encephalopathy: Secondary | ICD-10-CM | POA: Diagnosis not present

## 2019-08-18 DIAGNOSIS — Z952 Presence of prosthetic heart valve: Secondary | ICD-10-CM

## 2019-08-18 DIAGNOSIS — J181 Lobar pneumonia, unspecified organism: Secondary | ICD-10-CM | POA: Diagnosis not present

## 2019-08-18 DIAGNOSIS — R627 Adult failure to thrive: Secondary | ICD-10-CM | POA: Diagnosis present

## 2019-08-18 DIAGNOSIS — Z515 Encounter for palliative care: Secondary | ICD-10-CM | POA: Diagnosis not present

## 2019-08-18 DIAGNOSIS — N179 Acute kidney failure, unspecified: Secondary | ICD-10-CM | POA: Diagnosis not present

## 2019-08-18 DIAGNOSIS — R069 Unspecified abnormalities of breathing: Secondary | ICD-10-CM

## 2019-08-18 DIAGNOSIS — E871 Hypo-osmolality and hyponatremia: Secondary | ICD-10-CM | POA: Diagnosis present

## 2019-08-18 DIAGNOSIS — J9601 Acute respiratory failure with hypoxia: Secondary | ICD-10-CM | POA: Diagnosis not present

## 2019-08-18 DIAGNOSIS — E870 Hyperosmolality and hypernatremia: Secondary | ICD-10-CM | POA: Diagnosis not present

## 2019-08-18 DIAGNOSIS — T82898A Other specified complication of vascular prosthetic devices, implants and grafts, initial encounter: Secondary | ICD-10-CM | POA: Diagnosis not present

## 2019-08-18 DIAGNOSIS — E860 Dehydration: Secondary | ICD-10-CM | POA: Diagnosis not present

## 2019-08-18 DIAGNOSIS — R570 Cardiogenic shock: Secondary | ICD-10-CM

## 2019-08-18 DIAGNOSIS — R140 Abdominal distension (gaseous): Secondary | ICD-10-CM

## 2019-08-18 DIAGNOSIS — Z8673 Personal history of transient ischemic attack (TIA), and cerebral infarction without residual deficits: Secondary | ICD-10-CM

## 2019-08-18 DIAGNOSIS — I472 Ventricular tachycardia: Secondary | ICD-10-CM | POA: Diagnosis present

## 2019-08-18 DIAGNOSIS — Z8719 Personal history of other diseases of the digestive system: Secondary | ICD-10-CM

## 2019-08-18 DIAGNOSIS — I5041 Acute combined systolic (congestive) and diastolic (congestive) heart failure: Secondary | ICD-10-CM

## 2019-08-18 DIAGNOSIS — E876 Hypokalemia: Secondary | ICD-10-CM | POA: Diagnosis not present

## 2019-08-18 DIAGNOSIS — Z789 Other specified health status: Secondary | ICD-10-CM

## 2019-08-18 DIAGNOSIS — I5042 Chronic combined systolic (congestive) and diastolic (congestive) heart failure: Secondary | ICD-10-CM

## 2019-08-18 DIAGNOSIS — Z8547 Personal history of malignant neoplasm of testis: Secondary | ICD-10-CM

## 2019-08-18 DIAGNOSIS — I5043 Acute on chronic combined systolic (congestive) and diastolic (congestive) heart failure: Secondary | ICD-10-CM | POA: Diagnosis present

## 2019-08-18 DIAGNOSIS — I447 Left bundle-branch block, unspecified: Secondary | ICD-10-CM | POA: Diagnosis present

## 2019-08-18 DIAGNOSIS — Y95 Nosocomial condition: Secondary | ICD-10-CM | POA: Diagnosis not present

## 2019-08-18 DIAGNOSIS — E1122 Type 2 diabetes mellitus with diabetic chronic kidney disease: Secondary | ICD-10-CM | POA: Diagnosis present

## 2019-08-18 DIAGNOSIS — Z95810 Presence of automatic (implantable) cardiac defibrillator: Secondary | ICD-10-CM

## 2019-08-18 DIAGNOSIS — R6521 Severe sepsis with septic shock: Secondary | ICD-10-CM | POA: Diagnosis not present

## 2019-08-18 DIAGNOSIS — D649 Anemia, unspecified: Secondary | ICD-10-CM | POA: Diagnosis not present

## 2019-08-18 DIAGNOSIS — R0682 Tachypnea, not elsewhere classified: Secondary | ICD-10-CM

## 2019-08-18 DIAGNOSIS — Z79891 Long term (current) use of opiate analgesic: Secondary | ICD-10-CM

## 2019-08-18 DIAGNOSIS — E861 Hypovolemia: Secondary | ICD-10-CM | POA: Diagnosis present

## 2019-08-18 DIAGNOSIS — F419 Anxiety disorder, unspecified: Secondary | ICD-10-CM | POA: Diagnosis present

## 2019-08-18 DIAGNOSIS — Z8249 Family history of ischemic heart disease and other diseases of the circulatory system: Secondary | ICD-10-CM

## 2019-08-18 DIAGNOSIS — Z7189 Other specified counseling: Secondary | ICD-10-CM

## 2019-08-18 DIAGNOSIS — N170 Acute kidney failure with tubular necrosis: Secondary | ICD-10-CM | POA: Diagnosis not present

## 2019-08-18 DIAGNOSIS — E1151 Type 2 diabetes mellitus with diabetic peripheral angiopathy without gangrene: Secondary | ICD-10-CM | POA: Diagnosis present

## 2019-08-18 DIAGNOSIS — Z823 Family history of stroke: Secondary | ICD-10-CM

## 2019-08-18 DIAGNOSIS — Z6834 Body mass index (BMI) 34.0-34.9, adult: Secondary | ICD-10-CM

## 2019-08-18 DIAGNOSIS — I5032 Chronic diastolic (congestive) heart failure: Secondary | ICD-10-CM | POA: Diagnosis not present

## 2019-08-18 DIAGNOSIS — G4733 Obstructive sleep apnea (adult) (pediatric): Secondary | ICD-10-CM | POA: Diagnosis present

## 2019-08-18 DIAGNOSIS — I130 Hypertensive heart and chronic kidney disease with heart failure and stage 1 through stage 4 chronic kidney disease, or unspecified chronic kidney disease: Secondary | ICD-10-CM | POA: Diagnosis present

## 2019-08-18 DIAGNOSIS — Z808 Family history of malignant neoplasm of other organs or systems: Secondary | ICD-10-CM

## 2019-08-18 DIAGNOSIS — Z86010 Personal history of colonic polyps: Secondary | ICD-10-CM

## 2019-08-18 DIAGNOSIS — T460X1S Poisoning by cardiac-stimulant glycosides and drugs of similar action, accidental (unintentional), sequela: Secondary | ICD-10-CM | POA: Diagnosis not present

## 2019-08-18 DIAGNOSIS — I959 Hypotension, unspecified: Secondary | ICD-10-CM

## 2019-08-18 DIAGNOSIS — Z7901 Long term (current) use of anticoagulants: Secondary | ICD-10-CM

## 2019-08-18 LAB — POCT I-STAT 7, (LYTES, BLD GAS, ICA,H+H)
Acid-Base Excess: 4 mmol/L — ABNORMAL HIGH (ref 0.0–2.0)
Bicarbonate: 29 mmol/L — ABNORMAL HIGH (ref 20.0–28.0)
Calcium, Ion: 1.12 mmol/L — ABNORMAL LOW (ref 1.15–1.40)
HCT: 30 % — ABNORMAL LOW (ref 39.0–52.0)
Hemoglobin: 10.2 g/dL — ABNORMAL LOW (ref 13.0–17.0)
O2 Saturation: 95 %
Patient temperature: 97.4
Potassium: 7.1 mmol/L (ref 3.5–5.1)
Sodium: 117 mmol/L — CL (ref 135–145)
TCO2: 30 mmol/L (ref 22–32)
pCO2 arterial: 45.8 mmHg (ref 32.0–48.0)
pH, Arterial: 7.406 (ref 7.350–7.450)
pO2, Arterial: 75 mmHg — ABNORMAL LOW (ref 83.0–108.0)

## 2019-08-18 LAB — CBC WITH DIFFERENTIAL/PLATELET
Abs Immature Granulocytes: 0.42 10*3/uL — ABNORMAL HIGH (ref 0.00–0.07)
Basophils Absolute: 0.1 10*3/uL (ref 0.0–0.1)
Basophils Relative: 0 %
Eosinophils Absolute: 0.1 10*3/uL (ref 0.0–0.5)
Eosinophils Relative: 0 %
HCT: 32.7 % — ABNORMAL LOW (ref 39.0–52.0)
Hemoglobin: 9.6 g/dL — ABNORMAL LOW (ref 13.0–17.0)
Immature Granulocytes: 2 %
Lymphocytes Relative: 2 %
Lymphs Abs: 0.3 10*3/uL — ABNORMAL LOW (ref 0.7–4.0)
MCH: 20.9 pg — ABNORMAL LOW (ref 26.0–34.0)
MCHC: 29.4 g/dL — ABNORMAL LOW (ref 30.0–36.0)
MCV: 71.1 fL — ABNORMAL LOW (ref 80.0–100.0)
Monocytes Absolute: 1.2 10*3/uL — ABNORMAL HIGH (ref 0.1–1.0)
Monocytes Relative: 6 %
Neutro Abs: 17 10*3/uL — ABNORMAL HIGH (ref 1.7–7.7)
Neutrophils Relative %: 90 %
Platelets: 233 10*3/uL (ref 150–400)
RBC: 4.6 MIL/uL (ref 4.22–5.81)
RDW: 20.1 % — ABNORMAL HIGH (ref 11.5–15.5)
WBC: 19.1 10*3/uL — ABNORMAL HIGH (ref 4.0–10.5)
nRBC: 0.2 % (ref 0.0–0.2)

## 2019-08-18 LAB — I-STAT CHEM 8, ED
BUN: 98 mg/dL — ABNORMAL HIGH (ref 6–20)
Calcium, Ion: 0.93 mmol/L — ABNORMAL LOW (ref 1.15–1.40)
Chloride: 81 mmol/L — ABNORMAL LOW (ref 98–111)
Creatinine, Ser: 2.7 mg/dL — ABNORMAL HIGH (ref 0.61–1.24)
Glucose, Bld: 92 mg/dL (ref 70–99)
HCT: 33 % — ABNORMAL LOW (ref 39.0–52.0)
Hemoglobin: 11.2 g/dL — ABNORMAL LOW (ref 13.0–17.0)
Potassium: 8.4 mmol/L (ref 3.5–5.1)
Sodium: 112 mmol/L — CL (ref 135–145)
TCO2: 31 mmol/L (ref 22–32)

## 2019-08-18 LAB — POC SARS CORONAVIRUS 2 AG -  ED: SARS Coronavirus 2 Ag: NEGATIVE

## 2019-08-18 LAB — BASIC METABOLIC PANEL
Anion gap: 12 (ref 5–15)
BUN: 71 mg/dL — ABNORMAL HIGH (ref 6–20)
CO2: 22 mmol/L (ref 22–32)
Calcium: 7.8 mg/dL — ABNORMAL LOW (ref 8.9–10.3)
Chloride: 85 mmol/L — ABNORMAL LOW (ref 98–111)
Creatinine, Ser: 2.54 mg/dL — ABNORMAL HIGH (ref 0.61–1.24)
GFR calc Af Amer: 31 mL/min — ABNORMAL LOW (ref >60)
GFR calc non Af Amer: 27 mL/min — ABNORMAL LOW (ref >60)
Glucose, Bld: 81 mg/dL (ref 70–99)
Potassium: >7.5 mmol/L (ref 3.5–5.1)
Sodium: 119 mmol/L — CL (ref 135–145)

## 2019-08-18 LAB — TROPONIN I (HIGH SENSITIVITY): Troponin I (High Sensitivity): 33 ng/L — ABNORMAL HIGH (ref <18)

## 2019-08-18 LAB — RESPIRATORY PANEL BY RT PCR (FLU A&B, COVID)
Influenza A by PCR: NEGATIVE
Influenza B by PCR: NEGATIVE
SARS Coronavirus 2 by RT PCR: NEGATIVE

## 2019-08-18 LAB — SODIUM
Sodium: 123 mmol/L — ABNORMAL LOW (ref 135–145)
Sodium: 122 mmol/L — ABNORMAL LOW (ref 135–145)

## 2019-08-18 LAB — CBG MONITORING, ED
Glucose-Capillary: 127 mg/dL — ABNORMAL HIGH (ref 70–99)
Glucose-Capillary: 160 mg/dL — ABNORMAL HIGH (ref 70–99)
Glucose-Capillary: 95 mg/dL (ref 70–99)

## 2019-08-18 LAB — BRAIN NATRIURETIC PEPTIDE: B Natriuretic Peptide: 106.1 pg/mL — ABNORMAL HIGH (ref 0.0–100.0)

## 2019-08-18 LAB — PROTIME-INR
INR: 5.3 (ref 0.8–1.2)
Prothrombin Time: 48.3 seconds — ABNORMAL HIGH (ref 11.4–15.2)

## 2019-08-18 LAB — MRSA PCR SCREENING: MRSA by PCR: NEGATIVE

## 2019-08-18 LAB — DIGOXIN LEVEL: Digoxin Level: 2.6 ng/mL (ref 0.8–2.0)

## 2019-08-18 LAB — TYPE AND SCREEN
ABO/RH(D): B POS
Antibody Screen: NEGATIVE

## 2019-08-18 LAB — LACTIC ACID, PLASMA
Lactic Acid, Venous: 1.8 mmol/L (ref 0.5–1.9)
Lactic Acid, Venous: 2 mmol/L (ref 0.5–1.9)

## 2019-08-18 LAB — MAGNESIUM: Magnesium: 2.7 mg/dL — ABNORMAL HIGH (ref 1.7–2.4)

## 2019-08-18 LAB — OSMOLALITY: Osmolality: 277 mOsm/kg (ref 275–295)

## 2019-08-18 LAB — GLUCOSE, CAPILLARY
Glucose-Capillary: 106 mg/dL — ABNORMAL HIGH (ref 70–99)
Glucose-Capillary: 144 mg/dL — ABNORMAL HIGH (ref 70–99)
Glucose-Capillary: 98 mg/dL (ref 70–99)

## 2019-08-18 LAB — POTASSIUM
Potassium: 6.5 mmol/L (ref 3.5–5.1)
Potassium: 6 mmol/L — ABNORMAL HIGH (ref 3.5–5.1)

## 2019-08-18 LAB — HEPATITIS B CORE ANTIBODY, TOTAL: Hep B Core Total Ab: NONREACTIVE

## 2019-08-18 LAB — HEPATITIS B SURFACE ANTIGEN: Hepatitis B Surface Ag: NONREACTIVE

## 2019-08-18 LAB — PHOSPHORUS: Phosphorus: 5.8 mg/dL — ABNORMAL HIGH (ref 2.5–4.6)

## 2019-08-18 MED ORDER — DEXTROSE 5 % IV SOLN: Freq: Once | INTRAVENOUS | Status: AC

## 2019-08-18 MED ORDER — CALCIUM GLUCONATE-NACL 1-0.675 GM/50ML-% IV SOLN
1.0000 g | Freq: Once | INTRAVENOUS | Status: AC
  Administered 2019-08-18: 1000 mg via INTRAVENOUS
  Filled 2019-08-18: qty 50

## 2019-08-18 MED ORDER — INSULIN ASPART 100 UNIT/ML IV SOLN
10.0000 [IU] | Freq: Once | INTRAVENOUS | Status: AC
  Administered 2019-08-18: 10 [IU] via INTRAVENOUS

## 2019-08-18 MED ORDER — SODIUM CHLORIDE 0.9% IV SOLUTION: Freq: Once | INTRAVENOUS | Status: AC

## 2019-08-18 MED ORDER — SODIUM BICARBONATE 8.4 % IV SOLN
50.0000 meq | Freq: Once | INTRAVENOUS | Status: AC
  Administered 2019-08-18: 50 meq via INTRAVENOUS
  Filled 2019-08-18: qty 50

## 2019-08-18 MED ORDER — DEXTROSE 50 % IV SOLN
50.0000 mL | Freq: Once | INTRAVENOUS | Status: AC
  Administered 2019-08-18: 50 mL via INTRAVENOUS
  Filled 2019-08-18: qty 50

## 2019-08-18 MED ORDER — CHLORHEXIDINE GLUCONATE CLOTH 2 % EX PADS
6.0000 | MEDICATED_PAD | Freq: Every day | CUTANEOUS | Status: DC
  Administered 2019-08-19 – 2019-09-05 (×16): 6 via TOPICAL

## 2019-08-18 MED ORDER — SODIUM CHLORIDE 0.9 % IV SOLN: INTRAVENOUS | Status: DC

## 2019-08-18 MED ORDER — DOPAMINE-DEXTROSE 3.2-5 MG/ML-% IV SOLN
0.0000 ug/kg/min | INTRAVENOUS | Status: DC
  Administered 2019-08-18: 5 ug/kg/min via INTRAVENOUS
  Administered 2019-08-19: 10 ug/kg/min via INTRAVENOUS
  Administered 2019-08-19: 5 ug/kg/min via INTRAVENOUS
  Filled 2019-08-18 (×2): qty 250

## 2019-08-18 MED ORDER — DEXTROSE 50 % IV SOLN
1.0000 | Freq: Once | INTRAVENOUS | Status: AC
  Administered 2019-08-18: 50 mL via INTRAVENOUS
  Filled 2019-08-18: qty 50

## 2019-08-18 MED ORDER — ALBUTEROL SULFATE HFA 108 (90 BASE) MCG/ACT IN AERS
2.0000 | INHALATION_SPRAY | Freq: Once | RESPIRATORY_TRACT | Status: AC
  Administered 2019-08-18: 2 via RESPIRATORY_TRACT
  Filled 2019-08-18: qty 6.7

## 2019-08-18 MED ORDER — INSULIN ASPART 100 UNIT/ML IV SOLN
5.0000 [IU] | Freq: Once | INTRAVENOUS | Status: AC
  Administered 2019-08-18: 5 [IU] via INTRAVENOUS

## 2019-08-18 MED ORDER — PANTOPRAZOLE SODIUM 40 MG PO TBEC
40.0000 mg | DELAYED_RELEASE_TABLET | Freq: Every day | ORAL | Status: DC
  Administered 2019-08-19: 40 mg via ORAL
  Filled 2019-08-18: qty 1

## 2019-08-18 MED ORDER — SODIUM CHLORIDE 0.9 % IV BOLUS
500.0000 mL | Freq: Once | INTRAVENOUS | Status: AC
  Administered 2019-08-18: 500 mL via INTRAVENOUS

## 2019-08-18 MED ORDER — SODIUM CHLORIDE 0.9 % IV SOLN
2.0000 | Freq: Once | INTRAVENOUS | Status: AC
  Administered 2019-08-18: 2 via INTRAVENOUS
  Filled 2019-08-18: qty 80

## 2019-08-18 MED ORDER — HEPARIN SODIUM (PORCINE) 1000 UNIT/ML IJ SOLN
2.8000 mL | Freq: Once | INTRAMUSCULAR | Status: AC
  Administered 2019-08-18: 2800 [IU] via INTRAVENOUS
  Filled 2019-08-18: qty 3

## 2019-08-18 MED ORDER — INSULIN ASPART 100 UNIT/ML ~~LOC~~ SOLN
2.0000 [IU] | SUBCUTANEOUS | Status: DC
  Administered 2019-08-18 – 2019-08-24 (×6): 2 [IU] via SUBCUTANEOUS

## 2019-08-18 MED ORDER — AMIODARONE HCL 200 MG PO TABS: 200.0000 mg | ORAL_TABLET | Freq: Every day | ORAL | Status: DC

## 2019-08-18 NOTE — Telephone Encounter (Signed)
He was volume overloaded earlier this week. Instructed to take an extra metolazone on 12/15 and to continue metolazone three times a week.   Needs BMET and CBC. Please set up for lab work. If he feels worse he should go to the ED for an evaluation.   Bethzaida Boord NP-C  11:24 AM

## 2019-08-18 NOTE — Progress Notes (Signed)
eLink Physician-Brief Progress Note Patient Name: Lance Cook DOB: 1961/01/24 MRN: [id number]   Date of Service  08/17/2019  HPI/Events of Note  Notified by bedside nurse that HD catheter placed in R carotid artery this afternoon. INR = 5.3.   eICU Interventions  Will order: 1. Transfuse 4 units FFP. 2. Repeat INR at 4 AM. 3. Consult to Vascular Surgery for R carotid HD catheter removal.  4. After speaking with Dr. Donzetta Matters, vascular surgeon on call, plan is to remove R carotid HD catheter in OR if INR = 2.0 or less.      Intervention Category Major Interventions: Other:  Lysle Dingwall 08/06/2019, 9:49 PM

## 2019-08-18 NOTE — Telephone Encounter (Signed)
Amy Anngraves,RN

## 2019-08-18 NOTE — ED Notes (Signed)
Date and time results received: 08/10/2019 1637 (use smartphrase ".now" to insert current time)  Test: digoxin Critical Value: 2.6  Name of Provider Notified: Dr. Lamonte Sakai  Orders Received? Or Actions Taken?: no new orders

## 2019-08-18 NOTE — H&P (Signed)
NAME:  Lance Cook, MRN:  [id number], DOB:  1960-11-17, LOS: 0 ADMISSION DATE:  08/12/2019, CONSULTATION DATE:  08/17/2019 REFERRING MD:  Dr Sherry Ruffing, CHIEF COMPLAINT:  Hyperkalemia    Brief History     History of present illness   58 year old man, history of Bentall procedure, MVR, atrial fibrillation, CAD with chronic systolic and diastolic CHF.  Came to the ER with fatigue, weakness and hypotension.  He was found to be profoundly bradycardic with severe left bundle branch block and QRS widening on EKG.  Found to be severely hyperkalemic with new acute renal failure.  He is on digoxin at baseline and his digoxin level was 2.6.  He will be admitted emergently to the ICU for hemodialysis.  Past Medical History   has a past medical history of Anemia, Anxiety, ARDS (adult respiratory distress syndrome) ([name]) (01/27/2015), Arthritis, Ascending aortic dissection (HCC) (07/14/2008), Atrial fibrillation ([place]), Bell's palsy, C. difficile diarrhea, CAD (coronary artery disease), Cellulitis (04/02/2014), Cerebral artery occlusion with cerebral infarction ([place]) (10/08/2011), Chronic diastolic heart failure ([place]) (03/01/2015), CVA (cerebral vascular accident) ([place]) (10/08/2011), DIABETES MELLITUS, TYPE II (11/01/2009), Diverticulosis, 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), Gastric polyps, GERD (gastroesophageal reflux disease), Gout, Hiatal hernia, History of colon polyps (12/23/2011), Hyperlipidemia, Hypertension, Hypogonadism male (04/08/2012), Impaired glucose tolerance (10/08/2011), Morbid obesity ([place]) (04/02/2014), OSA (obstructive sleep apnea), Peripheral vascular disease ([place]), 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 ([id number]), 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   Significant Hospital Events   Urgent HD 12/17  Consults:  Nephrology  Procedures:  R IJ HD catheter 12/17 >>   Significant Diagnostic Tests:  NM cardiac amyloid scan 10/21 >> equivocal for any evidence anmyloidosis  Micro Data:    Antimicrobials:     Interim history/subjective:  Feels tired, denies any pain States that he has had poor appetite. Doesn't move around much.   Objective   Blood pressure (!) 74/43, pulse (!) 39, temperature (!) 97.4 F (36.3 C), temperature source Oral, resp. rate 15, height 5' (1.524 m), weight 80.3 kg, SpO2 95 %.        Intake/Output Summary (Last 24 hours) at 08/31/2019 1536 Last data filed at 08/08/2019 1509 Gross per 24 hour  Intake 50 ml  Output -  Net 50 ml   Filed Weights   08/12/2019 1400  Weight: 80.3 kg    Examination: General: Chronically ill-appearing man, laying in bed, looks tired HENT: Oropharynx dry, no icterus Lungs: Clear bilaterally, no crackles, no wheezes Cardiovascular: Severe bradycardia, distant, no murmur heard Abdomen: Distended, no fluid wave, + BS Extremities: 1+ pretibial edema, chronic venous stasis changes Neuro: awake, a bit lethargic, follows commands, moves all ext  Resolved Hospital Problem list      Assessment & Plan:  Severe life-threatening hyperkalemia due to acute on chronic (stage II-III) renal failure.  Etiology of his renal failure likely includes use of diuretics, poor p.o. intake.  He is on potassium supplementation as well. -He needs urgent hemodialysis.  Discussed with the patient the benefits and risks of hemodialysis catheter placement.  He agrees and will place one right now. -Admit to the ICU for urgent HD. -Medical temporizing measures for hyperkalemia already given in the ED -Hold all diuretics (metolazone, torsemide)  Digoxin toxicity -based on degree of hyperkalemia, digoxin level 2.6 will give Digibind  now, 2 vials -Dialysis planned as  above -Digoxin stopped  Chronic systolic and diastolic CHF with CAD, mechanical mitral valve, chronic atrial fibrillation, AICD/pacer.  Currently with severe left bundle branch block, wide-complex EKG, enlarged T waves, severe bradycardia Baseline anticoagulation -Hold Coumadin, start heparin infusion after procedures performed -Hold amiodarone, plan to restart once hemodynamically improved, rhythm improved -Hold Aldactone, metoprolol -Continue midodrine  Shock due to hyperkalemia, bradycardia -May require pressors, consider dopamine given his bradycardia -Will discuss possibly changing his backup rate on pacemaker, currently at 40, HR 40  Abdominal distension -consider bedside or dedicated abd [place] to eval for ascites.  -check LFT  DM type 2 -Start SSI per ICU protocol  OSA on CPAP -plan for CPAP qhs as long as MS allows adequate airway protection.   Best practice:  Diet: NPO except meds Pain/Anxiety/Delirium protocol (if indicated): NA VAP protocol (if indicated): NA DVT prophylaxis: heparin gtt GI prophylaxis: PPI Glucose control: SI per protocol Mobility: BR Code Status: Full Family Communication: discussed with pt 12/17 Disposition: ICU  Labs   CBC: Recent Labs  Lab 08/14/2019 1424  WBC 19.1*  NEUTROABS 17.0*  HGB 9.6*  11.2*  HCT 32.7*  33.0*  MCV 71.1*  PLT 0000000    Basic Metabolic Panel: Recent Labs  Lab 08/27/2019 1424  NA 112*  K 8.4*  CL 81*  GLUCOSE 92  BUN 98*  CREATININE 2.70*   GFR: Estimated Creatinine Clearance: 26.2 mL/min (A) (by C-G formula based on SCr of 2.7 mg/dL (H)). Recent Labs  Lab 08/12/2019 1424  WBC 19.1*  LATICACIDVEN 1.8    Liver Function Tests: No results for input(s): AST, ALT, ALKPHOS, BILITOT, PROT, ALBUMIN in the last 168 hours. No results for input(s): LIPASE, AMYLASE in the last 168 hours. No results for input(s): AMMONIA in the last 168 hours.  ABG    Component Value Date/Time   PHART 7.480 (H) 04/05/2019  1050   PCO2ART 50.9 (H) 04/05/2019 1050   PO2ART 85.9 04/05/2019 1050   HCO3 41.1 (H) 04/07/2019 0821   HCO3 42.2 (H) 04/07/2019 0821   TCO2 31 08/25/2019 1424   ACIDBASEDEF 1.0 01/26/2015 0425   O2SAT 64.0 06/27/2019 0330     Coagulation Profile: Recent Labs  Lab 08/16/19 0000  INR 2.1    Cardiac Enzymes: No results for input(s): CKTOTAL, CKMB, CKMBINDEX, TROPONINI in the last 168 hours.  HbA1C: Hgb A1c MFr Bld  Date/Time Value Ref Range Status  02/14/2019 06:08 AM 5.2 4.8 - 5.6 % Final    Comment:    (NOTE) Pre diabetes:          5.7%-6.4% Diabetes:              >6.4% Glycemic control for   <7.0% adults with diabetes   01/22/2015 12:43 PM 5.9 (H) 4.8 - 5.6 % Final    Comment:    (NOTE)         Pre-diabetes: 5.7 - 6.4         Diabetes: >6.4         Glycemic control for adults with diabetes: <7.0     CBG: Recent Labs  Lab 08/24/2019 1511  GLUCAP 127*    Review of Systems:   Fatigued, poor appetite. Denies pain.   Past Medical History  He,  has a past medical history of Anemia, Anxiety, ARDS (adult respiratory distress syndrome) ([place]) (01/27/2015), Arthritis, Ascending aortic dissection (HCC) (07/14/2008), Atrial fibrillation (HCC), Bell's palsy, C. difficile diarrhea, CAD (coronary artery disease), Cellulitis (  04/02/2014), Cerebral artery occlusion with cerebral infarction ([place]) (10/08/2011), Chronic diastolic heart failure ([place]) (03/01/2015), CVA (cerebral vascular accident) ([place]) (10/08/2011), DIABETES MELLITUS, TYPE II (11/01/2009), Diverticulosis, 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), Gastric polyps, GERD (gastroesophageal reflux disease), Gout, Hiatal hernia, History of colon polyps (12/23/2011), Hyperlipidemia, Hypertension, Hypogonadism male (04/08/2012), Impaired glucose tolerance (10/08/2011), Morbid obesity ([place]) (04/02/2014), OSA (obstructive sleep apnea), Peripheral vascular disease ([place]), S/P  minimally invasive mitral valve replacement with metallic  valve ([id number]), 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   Surgical History    Past Surgical History:  Procedure Laterality Date  . aortic truck    . BENTALL PROCEDURE  1988   25 mm St Jude mechanical valve conduit - Dr Blase Mess at [facility] in [place], [place]  . BIOPSY  06/01/2018   Procedure: BIOPSY;  Surgeon: Jerene Bears, MD;  Location: [facility];  Service: Gastroenterology;;  . BIV ICD INSERTION CRT-D N/A 06/27/2019   Procedure: BIV ICD INSERTION CRT-D;  Surgeon: Evans Lance, MD;  Location: [place] CV LAB;  Service: Cardiovascular;  Laterality: N/A;  . BUBBLE STUDY  04/04/2019   Procedure: BUBBLE STUDY;  Surgeon: Larey Dresser, MD;  Location: [facility] ENDOSCOPY;  Service: Cardiovascular;;  . CARDIAC VALVE REPLACEMENT     [phone]  . CHOLECYSTECTOMY  2011  . COLONOSCOPY WITH PROPOFOL N/A 06/17/2016   Procedure: COLONOSCOPY WITH PROPOFOL;  Surgeon: Jerene Bears, MD;  Location: [facility] ENDOSCOPY;  Service: Gastroenterology;  Laterality: N/A;  . COLONOSCOPY WITH PROPOFOL N/A 06/01/2018   Procedure: COLONOSCOPY WITH PROPOFOL;  Surgeon: Jerene Bears, MD;  Location: [facility] ENDOSCOPY;  Service: Gastroenterology;  Laterality: N/A;  . CORONARY ARTERY BYPASS GRAFT    . LASER ABLATION Left 03/06/2010   leg  . MITRAL VALVE REPLACEMENT Right 01/24/2015   Procedure: Re-Operation, MINIMALLY INVASIVE MITRAL VALVE (MV) REPLACEMENT.;  Surgeon: Rexene Alberts, MD;  Location: [facility];  Service: Open Heart Surgery;  Laterality: Right;  . ORCHIECTOMY  age 57   testicular cancer  . OTOPLASTY     bilateral, age 20  . POLYPECTOMY  06/01/2018   Procedure: POLYPECTOMY;  Surgeon: Jerene Bears, MD;  Location: [facility];  Service: Gastroenterology;;  . RIGHT  HEART CATH N/A 04/07/2019   Procedure: RIGHT HEART CATH;  Surgeon: Larey Dresser, MD;  Location: [place] CV LAB;  Service: Cardiovascular;  Laterality: N/A;  . TEE WITHOUT CARDIOVERSION N/A 10/11/2014   Procedure: TRANSESOPHAGEAL ECHOCARDIOGRAM (TEE);  Surgeon: Pixie Casino, MD;  Location: [facility] ENDOSCOPY;  Service: Cardiovascular;  Laterality: N/A;  . TEE WITHOUT CARDIOVERSION N/A 01/24/2015   Procedure: TRANSESOPHAGEAL ECHOCARDIOGRAM (TEE);  Surgeon: Rexene Alberts, MD;  Location: [facility];  Service: Open Heart Surgery;  Laterality: N/A;  . TEE WITHOUT CARDIOVERSION N/A 04/04/2019   Procedure: TRANSESOPHAGEAL ECHOCARDIOGRAM (TEE);  Surgeon: Larey Dresser, MD;  Location: [facility] ENDOSCOPY;  Service: Cardiovascular;  Laterality: N/A;  . TONSILLECTOMY  1967  . TOOTH EXTRACTION  2016     Social History   reports that he has quit smoking. His smoking use included cigars. He has never used smokeless tobacco. He reports current alcohol use. He reports that he does not use drugs.   Family History   His family history includes Cancer in his maternal aunt and maternal uncle; Diabetes in his father; Heart  disease in his mother; Hypertension in an other family member; Other in his mother; Stroke in an other family member; Throat cancer in his mother. There is no history of Colon cancer.   Allergies Allergies  Allergen Reactions  . Feraheme [Ferumoxytol] Shortness Of Breath    SOB/Tachycardia  . Penicillins Hives and Rash    Has patient had a PCN reaction causing immediate rash, facial/tongue/throat swelling, SOB or lightheadedness with hypotension: Yes Has patient had a PCN reaction causing severe rash involving mucus membranes or skin necrosis: Yes Has patient had a PCN reaction that required hospitalization unsure Has patient had a PCN reaction occurring within the last 10 years: 2010-2012 If all of the above answers are "NO", then may proceed with Cephalosporin use.  [name] Other (See Comments)    CDIFF, when taking oral tablets   . Tape Other (See Comments) and Itching    Skin irritation     Home Medications  Prior to Admission medications   Medication Sig Start Date End Date Taking? Authorizing Provider  allopurinol (ZYLOPRIM) 300 MG tablet Take 300 mg by mouth daily. 11/17/12   Biagio Borg, MD  ALPRAZolam Duanne Moron) 0.25 MG tablet Take 1 tablet (0.25 mg total) by mouth 2 (two) times daily as needed for anxiety. 07/01/19   Consuelo Pandy, PA-C  amiodarone (PACERONE) 200 MG tablet Take 1 tablet (200 mg total) by mouth 2 (two) times daily. Take 1 tablet 2 times a day for 1 week. Then take 1 tablet once daily. 07/01/19   Consuelo Pandy, PA-C  aspirin EC 81 MG EC tablet Take 1 tablet (81 mg total) by mouth daily. 02/27/19   [name], MD  digoxin (LANOXIN) 0.125 MG tablet Take 1 tablet (0.125 mg total) by mouth daily. 07/01/19   Lyda Jester M, PA-C  gabapentin (NEURONTIN) 300 MG capsule Take 300 mg by mouth 3 (three) times daily.    [provider]  magnesium oxide (MAG-OX) 400 MG tablet Take 400 mg by mouth 3 (three) times daily.    [provider]  methocarbamol (ROBAXIN) 500 MG tablet Take 1 tablet (500 mg total) by mouth 3 (three) times daily. 07/30/18 08/16/19  Vevelyn Francois, NP  metolazone (ZAROXOLYN) 5 MG tablet Take 1 tablet by mouth every Monday, Wednesday, and Friday 08/09/19   Lyda Jester M, PA-C  metoprolol succinate (TOPROL-XL) 25 MG 24 hr tablet Take 0.5 tablets (12.5 mg total) by mouth daily. 08/09/19   Lyda Jester M, PA-C  midodrine (PROAMATINE) 10 MG tablet Take 1 tablet (10 mg total) by mouth 3 (three) times daily with meals. 07/01/19   Lyda Jester M, PA-C  mupirocin ointment (BACTROBAN) 2 % Apply 1 application topically 3 (three) times daily.     [provider]  oxyCODONE (OXY IR/ROXICODONE) 5 MG immediate release tablet Take 1 tablet (5 mg  total) by mouth every 8 (eight) hours as needed for severe pain. 09/28/18 08/16/19  Vevelyn Francois, NP  potassium chloride SA (KLOR-CON) 20 MEQ tablet Take 3 tablets (60 mEq total) by mouth 2 (two) times daily. 07/01/19   Consuelo Pandy, PA-C  PRESCRIPTION MEDICATION See admin instructions. CPAP- At bedtime and during any time of rest    [provider]  silver sulfADIAZINE (SILVADENE) 1 % cream Apply 1 application topically daily as needed (for wound care- both legs).     [provider]  spironolactone (ALDACTONE) 25 MG tablet Take 1 tablet (25 mg total) by mouth  daily. 04/11/19   Clegg, Amy D, NP  torsemide (DEMADEX) 20 MG tablet Take 5 tablets (100 mg total) by mouth 2 (two) times daily. 07/20/19   Bensimhon, Shaune Pascal, MD  traZODone (DESYREL) 100 MG tablet Take 100 mg by mouth at bedtime.  01/24/19   [provider]  warfarin (COUMADIN) 5 MG tablet Take 1 tablet (5 mg total) by mouth daily at 6 PM. 07/01/19   Consuelo Pandy, PA-C     Critical care time: 64       Baltazar Apo, MD, PhD 08/26/2019, 4:11 PM [facility] [phone] or if no answer [phone]

## 2019-08-18 NOTE — Significant Event (Signed)
PCCM Interval Note    Patient underwent right neck hemodialysis catheter placement, procedure went smoothly.  On my review of the chest x-ray however the catheter takes a leftward deflection suggesting malposition.   ABG from the catheter confirms that it is arterial. Clearly it will need to be removed, but my priority right now is to get a new catheter in, get him HD for life threatening hyper K+.  Will need to hold his heparin prior. Could consider curbside VVS before removal. Will address going forward.   Baltazar Apo, MD, PhD 08/15/2019, 4:59 PM [facility] [phone] or if no answer [phone]

## 2019-08-18 NOTE — ED Notes (Signed)
[employer] rep called and reported no significant events, or shocks from ICD pacemaker

## 2019-08-18 NOTE — Progress Notes (Signed)
CRITICAL VALUE ALERT  Critical Value:  INR 5.3  Date & Time Notied:  08/14/2019 @ 1953  Provider Notified: Warren Lacy

## 2019-08-18 NOTE — Procedures (Signed)
Temporary Hemodialysis Catheter Insertion Procedure Note Brianna Bertolet [id number] [birth year]  Procedure: Insertion of Central Venous Catheter Indications: Emergent hemodialysis  Procedure Details Consent: Risks of procedure as well as the alternatives and risks of each were explained to the (patient/caregiver).  Consent for procedure obtained. Time Out: Verified patient identification, verified procedure, site/side was marked, verified correct patient position, special equipment/implants available, medications/allergies/relevent history reviewed, required imaging and test results available.  Performed  Maximum sterile technique was used including antiseptics, cap, gloves, gown, hand hygiene, mask and sheet. Skin prep: Chlorhexidine; local anesthetic administered A antimicrobial bonded/coated triple lumen catheter was placed in the left internal jugular vein using the Seldinger technique. Catheter placed to 20 cm. Blood aspirated via all 3 ports and then flushed x 3. Line sutured x 2 and dressing applied.  Ultrasound guidance used.Yes.    Evaluation Blood flow good Complications: No apparent complications Patient did tolerate procedure well. Chest X-ray ordered to verify placement.  CXR: normal.   Georgann Housekeeper, AGACNP-BC [place] for personal pager PCCM on call pager [phone]  08/13/2019 5:25 PM     l

## 2019-08-18 NOTE — Telephone Encounter (Signed)
I received a message this morning from patients spouse. I spoke with patient as stated in previous note. I received another VM this afternoon this time from [place] who stated patient was being taken to the hospital by EMS due to irregular heart rate and low bp.

## 2019-08-18 NOTE — Consult Note (Addendum)
Lance Cook Admit Date: 08/30/2019 08/26/2019 Lance Cook Requesting Physician:  Tegeler  Reason for Consult:  Hyperkalemia HPI:  58 year old male with chronic systolic and diastolic heart failure, mechanical mitral and aortic valve, permanent atrial fibrillation, recent admission this year with VT arrest with history of CRT/ICD, CAD/ICM, DM 2 and medications including spironolactone, torsemide, metolazone, KCl 60 mEq twice daily who presented to the emergency room with weakness and low blood pressure.  On intake he was noted to be bradycardic and EKG revealed prolonged QRS and near sign wave pattern.  Therapies for hyperkalemia were started, first based upon clinical suspicion then confirmed with i-STAT K of 8.4.  Patient has been feeling weak and fatigued at home.  Poor oral intake.  Blood pressures are 70s to 80s over 40s.  He is on room air with normal oxygen saturation.  Therapy thus far includes calcium chloride, 5 units of insulin with dextrose.    Patient also with a sodium of 123456 on his metabolic panel.  He does have chronic hyponatremia, usually between 125 and 130.     Creatinine (mg/dL)  Date Value  12/09/2013 0.88  09/22/2012 0.65  09/21/2012 0.72  09/20/2012 0.80  09/19/2012 0.78  09/16/2012 0.82  08/31/2012 0.75   Creat (mg/dL)  Date Value  01/16/2017 0.94  09/09/2016 0.99  07/14/2016 0.85  05/08/2016 0.83  11/13/2014 0.83   Creatinine, Ser (mg/dL)  Date Value  08/10/2019 2.70 (H)  08/02/2019 1.34 (H)  07/19/2019 1.45 (H)  07/11/2019 1.61 (H)  07/01/2019 1.06  06/30/2019 1.26 (H)  06/29/2019 1.25 (H)  06/28/2019 1.39 (H)  06/27/2019 1.33 (H)  06/26/2019 1.27 (H)  ] I/Os:   ROS Balance of 12 systems is negative w/ exceptions as above  PMH  Past Medical History:  Diagnosis Date  . Anemia   . Anxiety   . ARDS (adult respiratory distress syndrome) ([employer]) 01/27/2015  . Arthritis   . Ascending aortic dissection ([place]) 07/14/2008   Localized  dissection of ascending aorta noted on CTA in 2009 and stable on CTA in 2011  . Atrial fibrillation ([facility])    chronic persistent  . Bell's palsy   . C. difficile diarrhea   . CAD (coronary artery disease)    Old scar inferior wall myoview, 10/2009 EF 52%.  He did have previous SVG to RCA but no obstructive disease noted on his most recent cath.  SVG occluded.   . Cellulitis 04/02/2014  . Cerebral artery occlusion with cerebral infarction ([place]) 10/08/2011   Overview:  Overview:  And hx of TIA prior to CABG, all thought due to systemic emboli prior to coumadin   . Chronic diastolic heart failure ([place]) 03/01/2015   a. 12/2015: echo showing a preserved EF of 65-70%, moderate AS, and moderate TR.   Marland Kitchen CVA (cerebral vascular accident) ([place]) 10/08/2011   And hx of TIA prior to CABG, all thought due to systemic emboli prior to coumadin   . DIABETES MELLITUS, TYPE II 11/01/2009   Qualifier: Diagnosis of  By: [name] MD, [name]    . Diverticulosis   . 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 Gastric polyps   . GERD (gastroesophageal reflux disease)    not needing medication at thhis time- 01/22/15  . Gout   . Hiatal hernia   . History of colon polyps 12/23/2011   Overview:  Overview:  Colonoscopy  January 2010, 6 mm rectal tubulovillous adenoma. No high-grade dysplasia   . Hyperlipidemia   . Hypertension   . Hypogonadism male 04/08/2012  . Impaired glucose tolerance 10/08/2011  . Morbid obesity ([place]) 04/02/2014  . OSA (obstructive sleep apnea)    CPAP  . Peripheral vascular disease ([place])   . 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 [id number]   33 mm St Jude bileaflet mechanical valve placed via right mini thoracotomy approach  . 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    1988 - Dr Blase Mess at [facility] in [place], [place]  . Testicular cancer [facility])    He was 58 y/o. He had surgical resection and rad tx's.   Marland Kitchen TIA (transient ischemic attack)    age 89  . Tubulovillous adenoma of colon   . 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 Venous (peripheral) insufficiency 02/03/2005   PSH  Past Surgical History:  Procedure Laterality Date  . aortic truck    . BENTALL PROCEDURE  1988   25 mm St Jude mechanical valve conduit - Dr Blase Mess at [facility] in [place], [place]  . BIOPSY  06/01/2018   Procedure: BIOPSY;  Surgeon: Jerene Bears, MD;  Location: [facility];  Service: Gastroenterology;;  . BIV ICD INSERTION CRT-D N/A 06/27/2019   Procedure: BIV ICD INSERTION CRT-D;  Surgeon: Evans Lance, MD;  Location: [facility] CV [facility];  Service: Cardiovascular;  Laterality: N/A;  . BUBBLE STUDY  04/04/2019   Procedure: BUBBLE STUDY;  Surgeon: Larey Dresser, MD;  Location: [facility] ENDOSCOPY;  Service: Cardiovascular;;  . CARDIAC VALVE REPLACEMENT     [phone]  . CHOLECYSTECTOMY  2011  . COLONOSCOPY WITH PROPOFOL N/A 06/17/2016   Procedure: COLONOSCOPY WITH PROPOFOL;  Surgeon: Jerene Bears, MD;  Location: [facility] ENDOSCOPY;  Service: Gastroenterology;  Laterality: N/A;  . COLONOSCOPY WITH PROPOFOL N/A 06/01/2018   Procedure: COLONOSCOPY WITH PROPOFOL;  Surgeon: Jerene Bears, MD;  Location: [facility] ENDOSCOPY;  Service: Gastroenterology;  Laterality: N/A;  . CORONARY ARTERY BYPASS GRAFT    . LASER ABLATION Left 03/06/2010   leg  . MITRAL VALVE REPLACEMENT Right 01/24/2015   Procedure: Re-Operation, MINIMALLY INVASIVE MITRAL VALVE (MV) REPLACEMENT.;  Surgeon: Lance Alberts, MD;  Location: [facility];  Service: Open Heart Surgery;  Laterality: Right;  . ORCHIECTOMY  age 77   testicular cancer  . OTOPLASTY     bilateral, age 74  . POLYPECTOMY  06/01/2018   Procedure: POLYPECTOMY;  Surgeon: Jerene Bears, MD;  Location: [facility];  Service: Gastroenterology;;  . RIGHT HEART CATH  N/A 04/07/2019   Procedure: RIGHT HEART CATH;  Surgeon: Larey Dresser, MD;  Location: [facility];  Service: Cardiovascular;  Laterality: N/A;  . TEE WITHOUT CARDIOVERSION N/A 10/11/2014   Procedure: TRANSESOPHAGEAL ECHOCARDIOGRAM (TEE);  Surgeon: Pixie Casino, MD;  Location: [facility] ENDOSCOPY;  Service: Cardiovascular;  Laterality: N/A;  . TEE WITHOUT CARDIOVERSION N/A 01/24/2015   Procedure: TRANSESOPHAGEAL ECHOCARDIOGRAM (TEE);  Surgeon: Lance Alberts, MD;  Location: [place];  Service: Open Heart Surgery;  Laterality: N/A;  . TEE WITHOUT CARDIOVERSION N/A 04/04/2019   Procedure: TRANSESOPHAGEAL ECHOCARDIOGRAM (TEE);  Surgeon: Larey Dresser, MD;  Location: [facility] ENDOSCOPY;  Service: Cardiovascular;  Laterality: N/A;  . TONSILLECTOMY  1967  . TOOTH EXTRACTION  2016   FH  Family History  Problem Relation Age of Onset  . Heart disease Mother   .  Throat cancer Mother   . Other Mother        bowel obstruction  . Diabetes Father   . Stroke Other   . Hypertension Other   . Cancer Maternal Aunt        lung, brain  . Cancer Maternal Uncle        brain aneurysm  . Colon cancer Neg Hx    SH  reports that he has quit smoking. His smoking use included cigars. He has never used smokeless tobacco. He reports current alcohol use. He reports that he does not use drugs. Allergies  Allergies  Allergen Reactions  . Feraheme [Ferumoxytol] Shortness Of Breath    SOB/Tachycardia  . Penicillins Hives and Rash    Has patient had a PCN reaction causing immediate rash, facial/tongue/throat swelling, SOB or lightheadedness with hypotension: Yes Has patient had a PCN reaction causing severe rash involving mucus membranes or skin necrosis: Yes Has patient had a PCN reaction that required hospitalization unsure Has patient had a PCN reaction occurring within the last 10 years: 2010-2012 If all of the above answers are "NO", then may proceed with Cephalosporin use.  [name] Other  (See Comments)    CDIFF, when taking oral tablets   . Tape Other (See Comments) and Itching    Skin irritation   Home medications Prior to Admission medications   Medication Sig Start Date End Date Taking? Authorizing Provider  allopurinol (ZYLOPRIM) 300 MG tablet Take 300 mg by mouth daily. 11/17/12   Biagio Borg, MD  ALPRAZolam Duanne Moron) 0.25 MG tablet Take 1 tablet (0.25 mg total) by mouth 2 (two) times daily as needed for anxiety. 07/01/19   Consuelo Pandy, PA-C  amiodarone (PACERONE) 200 MG tablet Take 1 tablet (200 mg total) by mouth 2 (two) times daily. Take 1 tablet 2 times a day for 1 week. Then take 1 tablet once daily. 07/01/19   Consuelo Pandy, PA-C  aspirin EC 81 MG EC tablet Take 1 tablet (81 mg total) by mouth daily. 02/27/19   [name], MD  digoxin (LANOXIN) 0.125 MG tablet Take 1 tablet (0.125 mg total) by mouth daily. 07/01/19   Lyda Jester M, PA-C  gabapentin (NEURONTIN) 300 MG capsule Take 300 mg by mouth 3 (three) times daily.    [provider]  magnesium oxide (MAG-OX) 400 MG tablet Take 400 mg by mouth 3 (three) times daily.    [provider]  methocarbamol (ROBAXIN) 500 MG tablet Take 1 tablet (500 mg total) by mouth 3 (three) times daily. 07/30/18 08/16/19  Vevelyn Francois, NP  metolazone (ZAROXOLYN) 5 MG tablet Take 1 tablet by mouth every Monday, Wednesday, and Friday 08/09/19   Lyda Jester M, PA-C  metoprolol succinate (TOPROL-XL) 25 MG 24 hr tablet Take 0.5 tablets (12.5 mg total) by mouth daily. 08/09/19   Lyda Jester M, PA-C  midodrine (PROAMATINE) 10 MG tablet Take 1 tablet (10 mg total) by mouth 3 (three) times daily with meals. 07/01/19   Lyda Jester M, PA-C  mupirocin ointment (BACTROBAN) 2 % Apply 1 application topically 3 (three) times daily.     [provider]  oxyCODONE (OXY IR/ROXICODONE) 5 MG immediate release tablet Take 1 tablet (5 mg total) by mouth every 8 (eight) hours as  needed for severe pain. 09/28/18 08/16/19  Vevelyn Francois, NP  potassium chloride SA (KLOR-CON) 20 MEQ tablet Take 3 tablets (60 mEq total) by mouth 2 (two) times daily. 07/01/19  Consuelo Pandy, PA-C  PRESCRIPTION MEDICATION See admin instructions. CPAP- At bedtime and during any time of rest    [provider]  silver sulfADIAZINE (SILVADENE) 1 % cream Apply 1 application topically daily as needed (for wound care- both legs).     [provider]  spironolactone (ALDACTONE) 25 MG tablet Take 1 tablet (25 mg total) by mouth daily. 04/11/19   Clegg, Amy D, NP  torsemide (DEMADEX) 20 MG tablet Take 5 tablets (100 mg total) by mouth 2 (two) times daily. 07/20/19   Bensimhon, Shaune Pascal, MD  traZODone (DESYREL) 100 MG tablet Take 100 mg by mouth at bedtime.  01/24/19   [provider]  warfarin (COUMADIN) 5 MG tablet Take 1 tablet (5 mg total) by mouth daily at 6 PM. 07/01/19   Consuelo Pandy, PA-C    Current Medications Scheduled Meds: . insulin aspart  5 Units Intravenous Once   And  . dextrose  1 ampule Intravenous Once   Continuous Infusions: . calcium gluconate     PRN Meds:.  CBC Recent Labs  Lab 08/27/2019 1424  WBC 19.1*  NEUTROABS 17.0*  HGB 9.6*  11.2*  HCT 32.7*  33.0*  MCV 71.1*  PLT 0000000   Basic Metabolic Panel Recent Labs  Lab 08/27/2019 1424  NA 112*  K 8.4*  CL 81*  GLUCOSE 92  BUN 98*  CREATININE 2.70*    Physical Exam  Blood pressure (!) 86/60, pulse (!) 40, temperature (!) 97.4 F (36.3 C), temperature source Oral, resp. rate 16, height 5' (1.524 m), weight 80.3 kg, SpO2 98 %. GEN: Ill-appearing, lying calmly in bed ENT: Glasses on, some temporal wasting EYES: EOMI CV: Bradycardic, regular, crisp valve sounds PULM: Clear bilaterally ABD: Soft, nontender SKIN: Chronic venous stasis changes of hyperpigmentation and thickening of the lower extremities EXT: 2-3+ peripheral edema   Assessment 21M with life-threatening  hyperkalemia and acute on chronic renal insufficiency in the setting of complicated systolic heart failure with valvular disease and recent cardiac arrhythmias requiring ICD.  1. Severe hyperkalemia: Presumed as a consequence of potassium supplementation, use of spironolactone, acute on chronic renal failure; needs immediate dialysis and have discussed with the ED and CCM. 2. Acute on chronic renal failure, baseline creatinine approximately 1.3-1.6, presenting at 2.7, likely some component of hypovolemia with ongoing use of diuretics (torsemide, metolazone, spironolactone) 3. Hyponatremia, acute on chronic 4. Chronic systolic heart failure, status post aortic and mitral valve replacements, on digoxin 5. DM2 6. Chronic anticoagulation with warfarin 7. Hypotension  Plan 1. Would cont to give CaGluc and monitor for QRS narrowing 2. Would give more insulin/dextrose 3. Await full labs 4. HD as soon as possible, in ICU given instability, 1K bath, no UF 5. Hyponatremia makes management with dialysis more difficult, most life-threatening issue here is his hyperkalemia with cardiac instability which we need to improve however I do worry about overcorrection of his hyponatremia.  It appears this is chronic in nature.  We will try to use a low potassium bath and very gentle dialysis, QB 250, lowering the dialysate sodium concentration, and providing a shorter treatment.  He might need a follow-up treatment for this or ongoing therapy with a cation exchange resin. 6. CCM to assist with line placement appreciate their care 7. F/u dig level pending 8. Blood pressure is soft, will trying to see if we can get through hemotreatment as that will most quickly clear the potassium; can use albumin bolusing as needed.   Lance Cook  Modesta Messing  [id number] pgr 08/25/2019, 3:13 PM

## 2019-08-18 NOTE — Procedures (Addendum)
Hemodialysis Catheter Insertion Procedure Note Lance Cook [id number] [birth year]  Procedure: Insertion of Hemodialysis Catheter Indications: Dialysis Access   Procedure Details Consent: Risks of procedure as well as the alternatives and risks of each were explained to the (patient/caregiver).  Consent for procedure obtained. Time Out: Verified patient identification, verified procedure, site/side was marked, verified correct patient position, special equipment/implants available, medications/allergies/relevent history reviewed, required imaging and test results available.  Performed  Maximum sterile technique was used including antiseptics, cap, gloves, gown, hand hygiene, mask and sheet. Skin prep: Chlorhexidine; local anesthetic administered Triple lumen hemodialysis catheter was inserted into right internal jugular vein using the Seldinger technique.  Evaluation Blood flow good Complications: No apparent complications Patient did tolerate procedure well. Chest X-ray ordered to verify placement.  CXR: pending.  Addendum 1705:  CXR reviewed with evidence of arterial stick, ABG obtained to verify position with again evidence of arterial stick, given critical need for dialysis patient will be started on iHD with plans to removed line after patient is deemed stable.   Lance Cancel, NP-C [facility] Contact / Pager information can be found on Amion  08/21/2019, 4:10 PM

## 2019-08-18 NOTE — ED Triage Notes (Signed)
Pt from home with ems for initial c.o weakness and muscle tremors. Pt found to be bradycardic in the 40s, had pacemaker placed 1 month ago and is suppose to be pacing in the 60s. Pt also hypotensive 76/40. Pt recently at heart failure clinic on Wednesday and was told he was in fluid overload. Increased diuretic for every other day but pt not having increased urine output. Pt also taking digoxin for arrythmia. Pt a.ox4 at this time. C/o skight chest pain

## 2019-08-18 NOTE — Telephone Encounter (Signed)
pts wife left a voice message requesting a return call stated patient was having "terrible issues and need to see Dr.McLean today".  I called patient back and he stated he did not need an appointment with Dr.McLean he said he was a little shaky and had not taken his morning medications yet he wanted to know if his defibrillator went off in the middle of the night and just didn't notice. I told asked him if he had any other symptoms he denied any other complaints at this time. I advised him to contact Dr.Taylors office regarding his defibrillator. Pt stated he would call [place] office to speak with Dr.Taylor or RN.

## 2019-08-18 NOTE — Telephone Encounter (Signed)
Pt called the [place] office stating he has no energy, very fatigued, can't even walk to the bathroom alone. States no chest pain, a little shortness of breath- that is heavy and shallow. States he's having a hard time speaking and has an uneasy feeling in his stomach.   Pt stated the last time he felt this way he went into Afib in the hospital.   Stated he was fine yesterday and then started to feel weak in the evening, thinks he was very dehydrated last night, he slept through the night.   Took metolazone (ZAROXOLYN) 5 MG tablet [id number]  During the day.   Please give pt a call @ [phone]

## 2019-08-18 NOTE — ED Notes (Signed)
Portable at bedside 

## 2019-08-18 NOTE — ED Provider Notes (Signed)
[facility] DEPARTMENT Provider Note   CSN: [id number] Arrival date & time: 08/13/2019  1348     History Chief Complaint  Patient presents with  . Bradycardia  . Hypotension    Lance Cook is a 58 y.o. male.  The history is provided by the patient and medical records. No language interpreter was used.  Illness Location:  Fatigue Quality:  Severe Severity:  Severe Onset quality:  Gradual Duration:  3 days Timing:  Constant Progression:  Worsening Chronicity:  New Associated symptoms: fatigue   Associated symptoms: no abdominal pain, no chest pain, no congestion, no cough, no diarrhea, no fever, no headaches, no loss of consciousness, no nausea, no rash, no shortness of breath, no vomiting and no wheezing        Past Medical History:  Diagnosis Date  . Anemia   . Anxiety   . ARDS (adult respiratory distress syndrome) ([place]) 01/27/2015  . Arthritis   . Ascending aortic dissection ([place]) 07/14/2008   Localized dissection of ascending aorta noted on CTA in 2009 and stable on CTA in 2011  . Atrial fibrillation (HCC)    chronic persistent  . Bell's palsy   . C. difficile diarrhea   . CAD (coronary artery disease)    Old scar inferior wall myoview, 10/2009 EF 52%.  He did have previous SVG to RCA but no obstructive disease noted on his most recent cath.  SVG occluded.   . Cellulitis 04/02/2014  . Cerebral artery occlusion with cerebral infarction ([place]) 10/08/2011   Overview:  Overview:  And hx of TIA prior to CABG, all thought due to systemic emboli prior to coumadin   . Chronic diastolic heart failure ([place]) 03/01/2015   a. 12/2015: echo showing a preserved EF of 65-70%, moderate AS, and moderate TR.   Marland Kitchen CVA (cerebral vascular accident) ([place]) 10/08/2011   And hx of TIA prior to CABG, all thought due to systemic emboli prior to coumadin   . DIABETES MELLITUS, TYPE II 11/01/2009   Qualifier: Diagnosis of  By: [name] MD, [name]    . Diverticulosis   .  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 Gastric polyps   . GERD (gastroesophageal reflux disease)    not needing medication at thhis time- 01/22/15  . Gout   . Hiatal hernia   . History of colon polyps 12/23/2011   Overview:  Overview:  Colonoscopy January 2010, 6 mm rectal tubulovillous adenoma. No high-grade dysplasia   . Hyperlipidemia   . Hypertension   . Hypogonadism male 04/08/2012  . Impaired glucose tolerance 10/08/2011  . Morbid obesity ([place]) 04/02/2014  . OSA (obstructive sleep apnea)    CPAP  . Peripheral vascular disease ([place])   . 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 [id number]   33 mm St Jude bileaflet mechanical valve placed via right mini thoracotomy approach  . 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 Testicular cancer [facility])    He was 59 y/o. He had surgical resection and rad tx's.   Marland Kitchen TIA (transient ischemic attack)    age 52  . Tubulovillous adenoma of colon   . 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 Venous (peripheral) insufficiency 02/03/2005  Patient Active Problem List   Diagnosis Date Noted  . Acute on chronic systolic CHF (congestive heart failure), NYHA class 4 ([place]) 06/21/2019  . Acute on chronic diastolic (congestive) heart failure ([place]) 04/01/2019  . Acute on chronic congestive heart failure ([place])   . Anasarca   . Pressure injury of skin 02/14/2019  . Acute on chronic heart failure ([place]) 02/13/2019  . Benign neoplasm of ascending colon   . Benign neoplasm of rectum   . Benign neoplasm of rectosigmoid junction   . Benign neoplasm of sigmoid colon   . DM type 2 with diabetic dyslipidemia ([place]) 04/26/2018  . Lower extremity edema 03/08/2018  . Other fatigue  01/28/2018  . Chronic anticoagulation 01/05/2018  . Insomnia 11/23/2017  . Unilateral vocal cord paralysis 11/30/2016  . Leg swelling 11/28/2016  . S/P MVR (mitral valve replacement)   . Acute on chronic diastolic CHF (congestive heart failure) ([place]) 08/27/2016  . Chronic pain syndrome 07/10/2016  . Chronic diarrhea   . Benign neoplasm of transverse colon   . Scrotal swelling 05/30/2016  . Musculoskeletal pain 01/07/2016  . Personal history of malignant neoplasm of testis 07/12/2015  . Long term current use of opiate analgesic 07/11/2015  . Long term prescription opiate use 07/11/2015  . Opiate use (22.5 MME/day) 07/11/2015  . Opiate dependence ([place]) 07/11/2015  . Encounter for therapeutic drug level monitoring 07/11/2015  . Chronic low back pain ([place]) 07/11/2015  . Lumbar facet syndrome 07/11/2015  . Chronic lower extremity pain ([place]) 07/11/2015  . Adult BMI 30+ 06/19/2015  . Obesity (BMI 30-39.9) 06/19/2015  . Hypomagnesemia 02/08/2015  . Physical deconditioning 02/08/2015  . Enteritis due to Clostridium difficile 01/30/2015  . 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 [id number]  . Chronic diastolic congestive heart failure ([place])   . Type 2 diabetes mellitus with other specified complication ([place]) [id number]  . 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 Venous insufficiency of both lower extremities 04/02/2014  . Morbid obesity ([place]) 04/02/2014  . Diabetes mellitus ([place]) 03/14/2014  . Personal history of transient ischemic attack (TIA), and cerebral infarction without residual deficits 02/23/2014  . Malignant neoplasm of testis ([place]) 02/23/2014  . H/O TIA (transient ischemic attack) and stroke 02/23/2014  . HTN (hypertension) 02/23/2014  . Lymphadenopathy 02/23/2014  . S/P AVR (aortic valve replacement) 02/23/2014  . Encounter for therapeutic drug monitoring 02/02/2014  . Long term current use of  anticoagulant therapy 07/15/2012  . Hypogonadism male 04/08/2012  . 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 Testicular hypofunction 04/08/2012  . Male hypogonadism 04/08/2012  . Other testicular hypofunction 04/08/2012  . History of colonic polyps 12/23/2011  . Testicular cancer ([place])   . TIA (transient ischemic attack)   . OSA (obstructive sleep apnea)   . Lumbar spinal stenosis ([place] L4-5) 10/08/2011  . ED (erectile dysfunction) of organic origin 10/08/2011  . Lumbar canal stenosis 10/08/2011  . Low back pain 10/08/2011  . Spinal stenosis of lumbar region without neurogenic claudication 10/08/2011  . CAD (coronary artery disease)   . Spondylosis 04/29/2010  . DYSLIPIDEMIA 01/15/2010  . Chronic venous hypertension with ulcer ([place]) 01/15/2010  . Essential hypertension 12/14/2009  . Gout 11/01/2009  . Anemia, iron deficiency 11/01/2009  . MYOCARDIAL INFARCTION, HX OF 11/01/2009  . Atrial fibrillation, chronic 11/01/2009  . PERIPHERAL EDEMA 11/01/2009  . Heart valve replaced by other means 11/01/2009  . Presence of other heart-valve replacement 11/01/2009  . Ascending aortic dissection (HCC)  07/14/2008  . 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 Venous (peripheral) insufficiency 02/03/2005    Past Surgical History:  Procedure Laterality Date  . aortic truck    . BENTALL PROCEDURE  1988   25 mm St Jude mechanical valve conduit - Dr Blase Mess at [facility] in [place], [place]  . BIOPSY  06/01/2018   Procedure: BIOPSY;  Surgeon: Jerene Bears, MD;  Location: [facility];  Service: Gastroenterology;;  . BIV ICD INSERTION CRT-D N/A 06/27/2019   Procedure: BIV ICD INSERTION CRT-D;  Surgeon: Evans Lance, MD;  Location: [facility] CV LAB;  Service: Cardiovascular;  Laterality: N/A;  . BUBBLE STUDY  04/04/2019   Procedure: BUBBLE STUDY;  Surgeon: Larey Dresser, MD;  Location: [facility] ENDOSCOPY;  Service: Cardiovascular;;  . CARDIAC VALVE REPLACEMENT      [phone]  . CHOLECYSTECTOMY  2011  . COLONOSCOPY WITH PROPOFOL N/A 06/17/2016   Procedure: COLONOSCOPY WITH PROPOFOL;  Surgeon: Jerene Bears, MD;  Location: [facility] ENDOSCOPY;  Service: Gastroenterology;  Laterality: N/A;  . COLONOSCOPY WITH PROPOFOL N/A 06/01/2018   Procedure: COLONOSCOPY WITH PROPOFOL;  Surgeon: Jerene Bears, MD;  Location: [facility] ENDOSCOPY;  Service: Gastroenterology;  Laterality: N/A;  . CORONARY ARTERY BYPASS GRAFT    . LASER ABLATION Left 03/06/2010   leg  . MITRAL VALVE REPLACEMENT Right 01/24/2015   Procedure: Re-Operation, MINIMALLY INVASIVE MITRAL VALVE (MV) REPLACEMENT.;  Surgeon: Rexene Alberts, MD;  Location: [facility];  Service: Open Heart Surgery;  Laterality: Right;  . ORCHIECTOMY  age 58   testicular cancer  . OTOPLASTY     bilateral, age 7  . POLYPECTOMY  06/01/2018   Procedure: POLYPECTOMY;  Surgeon: Jerene Bears, MD;  Location: [facility];  Service: Gastroenterology;;  . RIGHT HEART CATH N/A 04/07/2019   Procedure: RIGHT HEART CATH;  Surgeon: Larey Dresser, MD;  Location: [facility] CV LAB;  Service: Cardiovascular;  Laterality: N/A;  . TEE WITHOUT CARDIOVERSION N/A 10/11/2014   Procedure: TRANSESOPHAGEAL ECHOCARDIOGRAM (TEE);  Surgeon: Pixie Casino, MD;  Location: [facility] ENDOSCOPY;  Service: Cardiovascular;  Laterality: N/A;  . TEE WITHOUT CARDIOVERSION N/A 01/24/2015   Procedure: TRANSESOPHAGEAL ECHOCARDIOGRAM (TEE);  Surgeon: Rexene Alberts, MD;  Location: [facility];  Service: Open Heart Surgery;  Laterality: N/A;  . TEE WITHOUT CARDIOVERSION N/A 04/04/2019   Procedure: TRANSESOPHAGEAL ECHOCARDIOGRAM (TEE);  Surgeon: Larey Dresser, MD;  Location: [facility] ENDOSCOPY;  Service: Cardiovascular;  Laterality: N/A;  . TONSILLECTOMY  1967  . TOOTH EXTRACTION  2016       Family History  Problem Relation Age of Onset  . Heart disease Mother   . Throat cancer Mother   . Other Mother        bowel obstruction  . Diabetes Father   . Stroke Other   . Hypertension Other    . Cancer Maternal Aunt        lung, brain  . Cancer Maternal Uncle        brain aneurysm  . Colon cancer Neg Hx     Social History   Tobacco Use  . Smoking status: Former Smoker    Types: Cigars  . Smokeless tobacco: Never Used  . Tobacco comment: about 3 yearly- cigar  Substance Use Topics  . Alcohol use: Yes    Alcohol/week: 0.0 standard drinks    Comment: social  . Drug use: No    Home Medications Prior to Admission medications   Medication Sig Start  Date End Date Taking? Authorizing Provider  allopurinol (ZYLOPRIM) 300 MG tablet Take 300 mg by mouth daily. 11/17/12   Biagio Borg, MD  ALPRAZolam Duanne Moron) 0.25 MG tablet Take 1 tablet (0.25 mg total) by mouth 2 (two) times daily as needed for anxiety. 07/01/19   Consuelo Pandy, PA-C  amiodarone (PACERONE) 200 MG tablet Take 1 tablet (200 mg total) by mouth 2 (two) times daily. Take 1 tablet 2 times a day for 1 week. Then take 1 tablet once daily. 07/01/19   Consuelo Pandy, PA-C  aspirin EC 81 MG EC tablet Take 1 tablet (81 mg total) by mouth daily. 02/27/19   [name], MD  digoxin (LANOXIN) 0.125 MG tablet Take 1 tablet (0.125 mg total) by mouth daily. 07/01/19   Lyda Jester M, PA-C  gabapentin (NEURONTIN) 300 MG capsule Take 300 mg by mouth 3 (three) times daily.    [provider]  magnesium oxide (MAG-OX) 400 MG tablet Take 400 mg by mouth 3 (three) times daily.    [provider]  methocarbamol (ROBAXIN) 500 MG tablet Take 1 tablet (500 mg total) by mouth 3 (three) times daily. 07/30/18 08/16/19  Vevelyn Francois, NP  metolazone (ZAROXOLYN) 5 MG tablet Take 1 tablet by mouth every Monday, Wednesday, and Friday 08/09/19   Lyda Jester M, PA-C  metoprolol succinate (TOPROL-XL) 25 MG 24 hr tablet Take 0.5 tablets (12.5 mg total) by mouth daily. 08/09/19   Lyda Jester M, PA-C  midodrine (PROAMATINE) 10 MG tablet Take 1 tablet (10 mg total) by mouth 3 (three) times daily with  meals. 07/01/19   Lyda Jester M, PA-C  mupirocin ointment (BACTROBAN) 2 % Apply 1 application topically 3 (three) times daily.     [provider]  oxyCODONE (OXY IR/ROXICODONE) 5 MG immediate release tablet Take 1 tablet (5 mg total) by mouth every 8 (eight) hours as needed for severe pain. 09/28/18 08/16/19  Vevelyn Francois, NP  potassium chloride SA (KLOR-CON) 20 MEQ tablet Take 3 tablets (60 mEq total) by mouth 2 (two) times daily. 07/01/19   Consuelo Pandy, PA-C  PRESCRIPTION MEDICATION See admin instructions. CPAP- At bedtime and during any time of rest    [provider]  silver sulfADIAZINE (SILVADENE) 1 % cream Apply 1 application topically daily as needed (for wound care- both legs).     [provider]  spironolactone (ALDACTONE) 25 MG tablet Take 1 tablet (25 mg total) by mouth daily. 04/11/19   Clegg, Amy D, NP  torsemide (DEMADEX) 20 MG tablet Take 5 tablets (100 mg total) by mouth 2 (two) times daily. 07/20/19   Bensimhon, Shaune Pascal, MD  traZODone (DESYREL) 100 MG tablet Take 100 mg by mouth at bedtime.  01/24/19   [provider]  warfarin (COUMADIN) 5 MG tablet Take 1 tablet (5 mg total) by mouth daily at 6 PM. 07/01/19   Consuelo Pandy, PA-C    Allergies    Feraheme [ferumoxytol], Penicillins, Sulfamethoxazole-trimethoprim, and Tape  Review of Systems   Review of Systems  Constitutional: Positive for fatigue. Negative for chills, diaphoresis and fever.  HENT: Negative for congestion.   Eyes: Negative for visual disturbance.  Respiratory: Negative for cough, chest tightness, shortness of breath, wheezing and stridor.   Cardiovascular: Negative for chest pain, palpitations and leg swelling.  Gastrointestinal: Negative for abdominal pain, constipation, diarrhea, nausea and vomiting.  Genitourinary: Negative for dysuria and flank pain.  Musculoskeletal: Negative for back pain, neck pain  and neck stiffness.  Skin: Negative for  rash and wound.  Neurological: Positive for light-headedness. Negative for dizziness, loss of consciousness, weakness and headaches.  Psychiatric/Behavioral: Negative for agitation and confusion.  All other systems reviewed and are negative.   Physical Exam Updated Vital Signs BP (!) 82/42   Pulse (!) 39   Temp (!) 97.4 F (36.3 C) (Oral)   Resp 17   Ht 5' (1.524 m)   Wt 80.3 kg   SpO2 99%   BMI 34.57 kg/m   Physical Exam Vitals and nursing note reviewed.  Constitutional:      General: He is not in acute distress.    Appearance: He is well-developed. He is not ill-appearing, toxic-appearing or diaphoretic.  HENT:     Head: Normocephalic and atraumatic.     Nose: Nose normal. No congestion or rhinorrhea.     Mouth/Throat:     Mouth: Mucous membranes are moist.     Pharynx: No oropharyngeal exudate or posterior oropharyngeal erythema.  Eyes:     Extraocular Movements: Extraocular movements intact.     Conjunctiva/sclera: Conjunctivae normal.     Pupils: Pupils are equal, round, and reactive to light.  Cardiovascular:     Rate and Rhythm: Bradycardia present.     Pulses: Normal pulses.     Heart sounds: No murmur.  Pulmonary:     Effort: Pulmonary effort is normal. No respiratory distress.     Breath sounds: Normal breath sounds.  Abdominal:     General: Abdomen is flat.     Palpations: Abdomen is soft.     Tenderness: There is no abdominal tenderness. There is no right CVA tenderness or left CVA tenderness.  Musculoskeletal:        General: No tenderness.     Cervical back: Neck supple. No tenderness.     Right lower leg: Edema present.     Left lower leg: Edema present.  Skin:    General: Skin is warm and dry.     Findings: No erythema.  Neurological:     General: No focal deficit present.     Mental Status: He is alert.     ED Results / Procedures / Treatments   Labs (all labs ordered are listed, but only abnormal results are displayed) Labs Reviewed   CBC WITH DIFFERENTIAL/PLATELET - Abnormal; Notable for the following components:      Result Value   WBC 19.1 (*)    Hemoglobin 9.6 (*)    HCT 32.7 (*)    MCV 71.1 (*)    MCH 20.9 (*)    MCHC 29.4 (*)    RDW 20.1 (*)    Neutro Abs 17.0 (*)    Lymphs Abs 0.3 (*)    Monocytes Absolute 1.2 (*)    Abs Immature Granulocytes 0.42 (*)    All other components within normal limits  BRAIN NATRIURETIC PEPTIDE - Abnormal; Notable for the following components:   B Natriuretic Peptide 106.1 (*)    All other components within normal limits  I-STAT CHEM 8, ED - Abnormal; Notable for the following components:   Sodium 112 (*)    Potassium 8.4 (*)    Chloride 81 (*)    BUN 98 (*)    Creatinine, Ser 2.70 (*)    Calcium, Ion 0.93 (*)    Hemoglobin 11.2 (*)    HCT 33.0 (*)    All other components within normal limits  CBG MONITORING, ED - Abnormal; Notable for the following components:  Glucose-Capillary 127 (*)    All other components within normal limits  CULTURE, BLOOD (ROUTINE X 2)  CULTURE, BLOOD (ROUTINE X 2)  URINE CULTURE  RESPIRATORY PANEL BY RT PCR (FLU A&B, COVID)  LACTIC ACID, PLASMA  LACTIC ACID, PLASMA  URINALYSIS, ROUTINE W REFLEX MICROSCOPIC  DIGOXIN LEVEL  PROTIME-INR  POC SARS CORONAVIRUS 2 AG -  ED    EKG EKG Interpretation  Date/Time:  Thursday August 18 2019 13:54:55 EST Ventricular Rate:  79 PR Interval:    QRS Duration: 296 QT Interval:  665 QTC Calculation: 871 R Axis:   -80 Text Interpretation: Atrial fibrillation Left bundle branch block When compared to prior, now paced. wide QRS concerning for hyoerkalemia NO STEMI Confirmed by Antony Blackbird [phone]) on 08/17/2019 2:03:25 PM   Radiology DG Chest Portable 1 View  Result Date: 08/27/2019 CLINICAL DATA:  Bradycardia. Hypotension. EXAM: PORTABLE CHEST 1 VIEW COMPARISON:  06/29/2019 FINDINGS: There is chronic cardiomegaly. Prosthetic mitral valve. AICD in place. Chronic prominence of the main  pulmonary artery. Slight atelectasis at the right lung base. The small right effusion seen on the prior study is not apparent on today's exam. Left lung is clear. No acute bone abnormality. IMPRESSION: 1. Chronic cardiomegaly. 2. Slight atelectasis at the right lung base. 3. The small right effusion seen on the prior study is not apparent on today's exam. Electronically Signed   By: Lorriane Shire M.D.   On: 08/25/2019 14:50    Procedures Procedures (including critical care time)  CRITICAL CARE Performed by: Gwenyth Allegra Pegi Milazzo Total critical care time: 50 minutes Critical care time was exclusive of separately billable procedures and treating other patients. Critical care was necessary to treat or prevent imminent or life-threatening deterioration. Critical care was time spent personally by me on the following activities: development of treatment plan with patient and/or surrogate as well as nursing, discussions with consultants, evaluation of patient's response to treatment, examination of patient, obtaining history from patient or surrogate, ordering and performing treatments and interventions, ordering and review of laboratory studies, ordering and review of radiographic studies, pulse oximetry and re-evaluation of patient's condition.   Medications Ordered in ED Medications  calcium gluconate 1 g/ 50 mL sodium chloride IVPB (1,000 mg Intravenous New Bag/Given 08/30/2019 1512)  Chlorhexidine Gluconate Cloth 2 % PADS 6 each (has no administration in time range)  sodium chloride 0.9 % bolus 500 mL (500 mLs Intravenous New Bag/Given 08/11/2019 1422)  calcium gluconate 1 g/ 50 mL sodium chloride IVPB (0 g Intravenous Stopped 08/20/2019 1509)  insulin aspart (novoLOG) injection 5 Units (5 Units Intravenous Given 08/17/2019 1431)    And  dextrose 50 % solution 50 mL (50 mLs Intravenous Given 08/08/2019 1430)  albuterol (VENTOLIN HFA) 108 (90 Base) MCG/ACT inhaler 2 puff (2 puffs Inhalation Given 08/07/2019  1433)  insulin aspart (novoLOG) injection 5 Units (5 Units Intravenous Given 08/24/2019 1517)    And  dextrose 50 % solution 50 mL (50 mLs Intravenous Given 08/16/2019 1513)    ED Course  I have reviewed the triage vital signs and the nursing notes.  Pertinent labs & imaging results that were available during my care of the patient were reviewed by me and considered in my medical decision making (see chart for details).    MDM Rules/Calculators/A&P                      Esten Sthilaire is a 58 y.o. male with a past medical history significant for prior  stroke, CHF with ICD/pacer, atrial fibrillation, CAD, hypertension, hyperlipidemia, peripheral vascular disease, and GERD who presents with decreased urination, fatigue, hypotension, and lightheadedness.  Patient reports that for the last few days ago stopped urinating.  He reports he occasionally has some urine is darker.  He denies dysuria.  He no fevers or chills.  He denies any chest pain or shortness breath does report occasional palpitations.  He has not had a syncopal episode but try to check his blood pressure at home and it was very low and undetectable.  He came to the emergency department and was found to have blood pressure in the [id number] and [id number] systolic.  Patient denies other complaints on arrival.  On exam, lungs have some crackles but no significant rhonchi.  Chest is nontender, abdomen nontender.  Patient is bradycardic with a rate in the 40s.  It is paced.  Patient has peripheral edema in legs which she reports is similar to prior.  EKG shows paced rhythm but is very concerning for hyperkalemia.  Due to the appearance of the EKG, he will be empirically treated with medications including calcium gluconate, insulin/dextrose, albuterol.  Cardiology was walking by the room and were asked to see the patient due to the EKG abnormality.  They agreed with likely electrolyte imbalance and to work that up initially.  They will pass along to his EP team  that he is here.  I-STAT potassium returned at 8.4.  This confirms the hyperkalemia is likely cause of his symptoms.  Patient was given more calcium and insulin/glucose.  Nephrology was called who will come see patient for likely emergent dialysis.  Rapid Covid test was ordered so that he can have his procedure.  Critical care was called for admission and central line management.  Rapid Covid test is negative.  Digoxin level was added to look for dig toxicity.  Chart view shows that patient does take oral potassium supplementation given his diuretic use with the spironolactone, torsemide, and metolazone.  Suspect combination of kidney injury, diuresis, and potassium use led to his hyperkalemia.  Critical care will admit patient and he was managed by nephrology with cardiology following.   Final Clinical Impression(s) / ED Diagnoses Final diagnoses:  Hyperkalemia  Bradycardia  Hypotension, unspecified hypotension type     Clinical Impression: 1. Hyperkalemia   2. Bradycardia   3. Hypotension, unspecified hypotension type     Disposition: Admit  This note was prepared with assistance of Dragon voice recognition software. Occasional wrong-word or sound-a-like substitutions may have occurred due to the inherent limitations of voice recognition software.     Lilibeth Opie, Gwenyth Allegra, MD 08/28/2019 [phone]

## 2019-08-18 NOTE — ED Notes (Signed)
Lab called this RN to inform light green top hemolyzed, unable to redraw at this time due to sterile procedure in process, will redraw when procedure complete

## 2019-08-18 NOTE — ED Notes (Signed)
Update given to Wife 

## 2019-08-18 NOTE — Telephone Encounter (Signed)
Amy Anngraves,RN with HH called to reports she also received a message from patient/patients wife regarding increased weakness.  She is en route to see patient for assessment  Advised of message/labs from [name],NP meds increased at [facility], needs labs and should report to ER if he feels worse  Amy A,RN aware and will return call if anything further is needed

## 2019-08-18 NOTE — Progress Notes (Signed)
ABG ran for Dr Lamonte Sakai on [facility]. ABG normal, but had critical Na-117, & K-7.1. Results given to Dr Lamonte Sakai at 1640.  Ashley Mariner RRT

## 2019-08-18 NOTE — ED Notes (Signed)
CCM at bedside to insert central line, consent from signed

## 2019-08-19 ENCOUNTER — Encounter (HOSPITAL_COMMUNITY): Payer: Self-pay | Admitting: Emergency Medicine

## 2019-08-19 ENCOUNTER — Encounter (HOSPITAL_COMMUNITY): Admission: EM | Disposition: E | Payer: Self-pay | Source: Home / Self Care | Attending: Pulmonary Disease

## 2019-08-19 ENCOUNTER — Inpatient Hospital Stay (HOSPITAL_COMMUNITY): Payer: Medicare HMO | Admitting: Anesthesiology

## 2019-08-19 DIAGNOSIS — I5023 Acute on chronic systolic (congestive) heart failure: Secondary | ICD-10-CM

## 2019-08-19 DIAGNOSIS — N183 Chronic kidney disease, stage 3 unspecified: Secondary | ICD-10-CM

## 2019-08-19 DIAGNOSIS — T82898A Other specified complication of vascular prosthetic devices, implants and grafts, initial encounter: Secondary | ICD-10-CM

## 2019-08-19 DIAGNOSIS — I5032 Chronic diastolic (congestive) heart failure: Secondary | ICD-10-CM

## 2019-08-19 DIAGNOSIS — N179 Acute kidney failure, unspecified: Secondary | ICD-10-CM

## 2019-08-19 HISTORY — PX: REMOVAL OF A DIALYSIS CATHETER: SHX6053

## 2019-08-19 LAB — RENAL FUNCTION PANEL
Albumin: 2.3 g/dL — ABNORMAL LOW (ref 3.5–5.0)
Anion gap: 9 (ref 5–15)
BUN: 50 mg/dL — ABNORMAL HIGH (ref 6–20)
CO2: 27 mmol/L (ref 22–32)
Calcium: 8.8 mg/dL — ABNORMAL LOW (ref 8.9–10.3)
Chloride: 91 mmol/L — ABNORMAL LOW (ref 98–111)
Creatinine, Ser: 1.65 mg/dL — ABNORMAL HIGH (ref 0.61–1.24)
GFR calc Af Amer: 52 mL/min — ABNORMAL LOW (ref >60)
GFR calc non Af Amer: 45 mL/min — ABNORMAL LOW (ref >60)
Glucose, Bld: 131 mg/dL — ABNORMAL HIGH (ref 70–99)
Phosphorus: 4.9 mg/dL — ABNORMAL HIGH (ref 2.5–4.6)
Potassium: 5 mmol/L (ref 3.5–5.1)
Sodium: 127 mmol/L — ABNORMAL LOW (ref 135–145)

## 2019-08-19 LAB — PROTIME-INR
INR: 2.4 — ABNORMAL HIGH (ref 0.8–1.2)
INR: 2.2 — ABNORMAL HIGH (ref 0.8–1.2)
Prothrombin Time: 25.7 seconds — ABNORMAL HIGH (ref 11.4–15.2)
Prothrombin Time: 24.7 seconds — ABNORMAL HIGH (ref 11.4–15.2)

## 2019-08-19 LAB — MAGNESIUM
Magnesium: 2.6 mg/dL — ABNORMAL HIGH (ref 1.7–2.4)
Magnesium: 2.5 mg/dL — ABNORMAL HIGH (ref 1.7–2.4)

## 2019-08-19 LAB — CBC
HCT: 36.8 % — ABNORMAL LOW (ref 39.0–52.0)
Hemoglobin: 11 g/dL — ABNORMAL LOW (ref 13.0–17.0)
MCH: 21.1 pg — ABNORMAL LOW (ref 26.0–34.0)
MCHC: 29.9 g/dL — ABNORMAL LOW (ref 30.0–36.0)
MCV: 70.5 fL — ABNORMAL LOW (ref 80.0–100.0)
Platelets: 221 10*3/uL (ref 150–400)
RBC: 5.22 MIL/uL (ref 4.22–5.81)
RDW: 20.5 % — ABNORMAL HIGH (ref 11.5–15.5)
WBC: 25.3 10*3/uL — ABNORMAL HIGH (ref 4.0–10.5)
nRBC: 0 % (ref 0.0–0.2)

## 2019-08-19 LAB — POCT I-STAT 7, (LYTES, BLD GAS, ICA,H+H)
Acid-Base Excess: 5 mmol/L — ABNORMAL HIGH (ref 0.0–2.0)
Acid-Base Excess: 6 mmol/L — ABNORMAL HIGH (ref 0.0–2.0)
Acid-Base Excess: 4 mmol/L — ABNORMAL HIGH (ref 0.0–2.0)
Bicarbonate: 30 mmol/L — ABNORMAL HIGH (ref 20.0–28.0)
Bicarbonate: 32.5 mmol/L — ABNORMAL HIGH (ref 20.0–28.0)
Bicarbonate: 30.9 mmol/L — ABNORMAL HIGH (ref 20.0–28.0)
Calcium, Ion: 1.11 mmol/L — ABNORMAL LOW (ref 1.15–1.40)
Calcium, Ion: 1.18 mmol/L (ref 1.15–1.40)
Calcium, Ion: 1.14 mmol/L — ABNORMAL LOW (ref 1.15–1.40)
HCT: 30 % — ABNORMAL LOW (ref 39.0–52.0)
HCT: 30 % — ABNORMAL LOW (ref 39.0–52.0)
HCT: 32 % — ABNORMAL LOW (ref 39.0–52.0)
Hemoglobin: 10.2 g/dL — ABNORMAL LOW (ref 13.0–17.0)
Hemoglobin: 10.2 g/dL — ABNORMAL LOW (ref 13.0–17.0)
Hemoglobin: 10.9 g/dL — ABNORMAL LOW (ref 13.0–17.0)
O2 Saturation: 98 %
O2 Saturation: 100 %
O2 Saturation: 94 %
Patient temperature: 37
Patient temperature: 98.6
Patient temperature: 97.6
Potassium: 5.5 mmol/L — ABNORMAL HIGH (ref 3.5–5.1)
Potassium: 4.8 mmol/L (ref 3.5–5.1)
Potassium: 4.7 mmol/L (ref 3.5–5.1)
Sodium: 123 mmol/L — ABNORMAL LOW (ref 135–145)
Sodium: 124 mmol/L — ABNORMAL LOW (ref 135–145)
Sodium: 128 mmol/L — ABNORMAL LOW (ref 135–145)
TCO2: 31 mmol/L (ref 22–32)
TCO2: 34 mmol/L — ABNORMAL HIGH (ref 22–32)
TCO2: 33 mmol/L — ABNORMAL HIGH (ref 22–32)
pCO2 arterial: 47.2 mmHg (ref 32.0–48.0)
pCO2 arterial: 55.6 mmHg — ABNORMAL HIGH (ref 32.0–48.0)
pCO2 arterial: 56.8 mmHg — ABNORMAL HIGH (ref 32.0–48.0)
pH, Arterial: 7.41 (ref 7.350–7.450)
pH, Arterial: 7.375 (ref 7.350–7.450)
pH, Arterial: 7.341 — ABNORMAL LOW (ref 7.350–7.450)
pO2, Arterial: 107 mmHg (ref 83.0–108.0)
pO2, Arterial: 238 mmHg — ABNORMAL HIGH (ref 83.0–108.0)
pO2, Arterial: 77 mmHg — ABNORMAL LOW (ref 83.0–108.0)

## 2019-08-19 LAB — COMPREHENSIVE METABOLIC PANEL
ALT: 16 U/L (ref 0–44)
ALT: 15 U/L (ref 0–44)
AST: 34 U/L (ref 15–41)
AST: 25 U/L (ref 15–41)
Albumin: 2.3 g/dL — ABNORMAL LOW (ref 3.5–5.0)
Albumin: 2.4 g/dL — ABNORMAL LOW (ref 3.5–5.0)
Alkaline Phosphatase: 54 U/L (ref 38–126)
Alkaline Phosphatase: 59 U/L (ref 38–126)
Anion gap: 11 (ref 5–15)
Anion gap: 10 (ref 5–15)
BUN: 61 mg/dL — ABNORMAL HIGH (ref 6–20)
BUN: 56 mg/dL — ABNORMAL HIGH (ref 6–20)
CO2: 25 mmol/L (ref 22–32)
CO2: 29 mmol/L (ref 22–32)
Calcium: 8.3 mg/dL — ABNORMAL LOW (ref 8.9–10.3)
Calcium: 8.3 mg/dL — ABNORMAL LOW (ref 8.9–10.3)
Chloride: 87 mmol/L — ABNORMAL LOW (ref 98–111)
Chloride: 86 mmol/L — ABNORMAL LOW (ref 98–111)
Creatinine, Ser: 2.05 mg/dL — ABNORMAL HIGH (ref 0.61–1.24)
Creatinine, Ser: 1.99 mg/dL — ABNORMAL HIGH (ref 0.61–1.24)
GFR calc Af Amer: 40 mL/min — ABNORMAL LOW (ref >60)
GFR calc Af Amer: 42 mL/min — ABNORMAL LOW (ref >60)
GFR calc non Af Amer: 35 mL/min — ABNORMAL LOW (ref >60)
GFR calc non Af Amer: 36 mL/min — ABNORMAL LOW (ref >60)
Glucose, Bld: 119 mg/dL — ABNORMAL HIGH (ref 70–99)
Glucose, Bld: 110 mg/dL — ABNORMAL HIGH (ref 70–99)
Potassium: 6.9 mmol/L (ref 3.5–5.1)
Potassium: 5.6 mmol/L — ABNORMAL HIGH (ref 3.5–5.1)
Sodium: 123 mmol/L — ABNORMAL LOW (ref 135–145)
Sodium: 125 mmol/L — ABNORMAL LOW (ref 135–145)
Total Bilirubin: 0.5 mg/dL (ref 0.3–1.2)
Total Bilirubin: 1 mg/dL (ref 0.3–1.2)
Total Protein: 5.3 g/dL — ABNORMAL LOW (ref 6.5–8.1)
Total Protein: 5.4 g/dL — ABNORMAL LOW (ref 6.5–8.1)

## 2019-08-19 LAB — SODIUM
Sodium: 125 mmol/L — ABNORMAL LOW (ref 135–145)
Sodium: 126 mmol/L — ABNORMAL LOW (ref 135–145)
Sodium: 125 mmol/L — ABNORMAL LOW (ref 135–145)

## 2019-08-19 LAB — GLUCOSE, CAPILLARY
Glucose-Capillary: 100 mg/dL — ABNORMAL HIGH (ref 70–99)
Glucose-Capillary: 104 mg/dL — ABNORMAL HIGH (ref 70–99)
Glucose-Capillary: 108 mg/dL — ABNORMAL HIGH (ref 70–99)
Glucose-Capillary: 110 mg/dL — ABNORMAL HIGH (ref 70–99)
Glucose-Capillary: 118 mg/dL — ABNORMAL HIGH (ref 70–99)
Glucose-Capillary: 138 mg/dL — ABNORMAL HIGH (ref 70–99)

## 2019-08-19 LAB — URINALYSIS, ROUTINE W REFLEX MICROSCOPIC
Bilirubin Urine: NEGATIVE
Glucose, UA: NEGATIVE mg/dL
Hgb urine dipstick: NEGATIVE
Ketones, ur: NEGATIVE mg/dL
Leukocytes,Ua: NEGATIVE
Nitrite: NEGATIVE
Protein, ur: NEGATIVE mg/dL
Specific Gravity, Urine: 1.009 (ref 1.005–1.030)
pH: 5 (ref 5.0–8.0)

## 2019-08-19 LAB — HEMOGLOBIN AND HEMATOCRIT, BLOOD
HCT: 29.5 % — ABNORMAL LOW (ref 39.0–52.0)
Hemoglobin: 8.6 g/dL — ABNORMAL LOW (ref 13.0–17.0)

## 2019-08-19 LAB — POTASSIUM
Potassium: 5.7 mmol/L — ABNORMAL HIGH (ref 3.5–5.1)
Potassium: 5.9 mmol/L — ABNORMAL HIGH (ref 3.5–5.1)
Potassium: 5.6 mmol/L — ABNORMAL HIGH (ref 3.5–5.1)

## 2019-08-19 LAB — URINE CULTURE: Culture: <10000 — AB

## 2019-08-19 LAB — COOXEMETRY PANEL
Carboxyhemoglobin: 0.8 % (ref 0.5–1.5)
Methemoglobin: 0.8 % (ref 0.0–1.5)
O2 Saturation: 59.4 %
Total hemoglobin: 9.5 g/dL — ABNORMAL LOW (ref 12.0–16.0)

## 2019-08-19 LAB — DIGOXIN LEVEL: Digoxin Level: 9 ng/mL (ref 0.8–2.0)

## 2019-08-19 SURGERY — REMOVAL, DIALYSIS CATHETER
Anesthesia: General | Site: Neck | Laterality: Right

## 2019-08-19 MED ORDER — BISACODYL 10 MG RE SUPP: 10.0000 mg | Freq: Every day | RECTAL | Status: DC | PRN

## 2019-08-19 MED ORDER — FENTANYL CITRATE (PF) 100 MCG/2ML IJ SOLN: 25.0000 ug | Freq: Once | INTRAMUSCULAR | Status: AC

## 2019-08-19 MED ORDER — CEFAZOLIN SODIUM 1 G IJ SOLR
INTRAMUSCULAR | Status: AC
  Filled 2019-08-19: qty 20

## 2019-08-19 MED ORDER — PRISMASOL BGK 4/2.5 32-4-2.5 MEQ/L REPLACEMENT SOLN
Status: DC
  Filled 2019-08-19 (×16): qty 5000

## 2019-08-19 MED ORDER — HEPARIN (PORCINE) 25000 UT/250ML-% IV SOLN
1650.0000 [IU]/h | INTRAVENOUS | Status: DC
  Administered 2019-08-19: 21:00:00 900 [IU]/h via INTRAVENOUS
  Administered 2019-08-20: 1400 [IU]/h via INTRAVENOUS
  Filled 2019-08-19 (×3): qty 250

## 2019-08-19 MED ORDER — PROTAMINE SULFATE 10 MG/ML IV SOLN
INTRAVENOUS | Status: DC | PRN
  Administered 2019-08-19: 20 mg via INTRAVENOUS
  Administered 2019-08-19: 40 mg via INTRAVENOUS

## 2019-08-19 MED ORDER — DOPAMINE-DEXTROSE 3.2-5 MG/ML-% IV SOLN: 0.0000 ug/kg/min | INTRAVENOUS | Status: DC

## 2019-08-19 MED ORDER — HEPARIN SODIUM (PORCINE) 1000 UNIT/ML DIALYSIS
1000.0000 [IU] | INTRAMUSCULAR | Status: DC | PRN
  Filled 2019-08-19: qty 6

## 2019-08-19 MED ORDER — THROMBIN (RECOMBINANT) 20000 UNITS EX SOLR
CUTANEOUS | Status: AC
  Filled 2019-08-19: qty 20000

## 2019-08-19 MED ORDER — ALPRAZOLAM 0.5 MG PO TABS
0.5000 mg | ORAL_TABLET | Freq: Two times a day (BID) | ORAL | Status: DC | PRN
  Administered 2019-08-19: 0.5 mg via ORAL
  Filled 2019-08-19: qty 1

## 2019-08-19 MED ORDER — SODIUM CHLORIDE (PF) 0.9 % IJ SOLN
INTRAMUSCULAR | Status: AC
  Filled 2019-08-19: qty 10

## 2019-08-19 MED ORDER — SODIUM CHLORIDE 0.9 % IV SOLN
INTRAVENOUS | Status: DC | PRN
  Administered 2019-08-22: 1000 mL via INTRAVENOUS
  Administered 2019-08-26 – 2019-08-27 (×2): 250 mL via INTRAVENOUS
  Administered 2019-08-30 (×2): 500 mL via INTRAVENOUS

## 2019-08-19 MED ORDER — SODIUM CHLORIDE 0.9 % IV SOLN
INTRAVENOUS | Status: AC
  Filled 2019-08-19: qty 1.2

## 2019-08-19 MED ORDER — FENTANYL CITRATE (PF) 100 MCG/2ML IJ SOLN
100.0000 ug | Freq: Once | INTRAMUSCULAR | Status: AC
  Administered 2019-08-19: 16:00:00 100 ug via INTRAVENOUS

## 2019-08-19 MED ORDER — FENTANYL BOLUS VIA INFUSION
25.0000 ug | INTRAVENOUS | Status: DC | PRN
  Administered 2019-08-19 – 2019-08-24 (×15): 25 ug via INTRAVENOUS
  Filled 2019-08-19: qty 25

## 2019-08-19 MED ORDER — SODIUM CHLORIDE 0.9% IV SOLUTION: Freq: Once | INTRAVENOUS | Status: AC

## 2019-08-19 MED ORDER — PROPOFOL 10 MG/ML IV BOLUS
INTRAVENOUS | Status: AC
  Filled 2019-08-19: qty 20

## 2019-08-19 MED ORDER — PHENYLEPHRINE 40 MCG/ML (10ML) SYRINGE FOR IV PUSH (FOR BLOOD PRESSURE SUPPORT)
PREFILLED_SYRINGE | INTRAVENOUS | Status: AC
  Filled 2019-08-19: qty 10

## 2019-08-19 MED ORDER — FENTANYL CITRATE (PF) 250 MCG/5ML IJ SOLN
INTRAMUSCULAR | Status: AC
  Filled 2019-08-19: qty 5

## 2019-08-19 MED ORDER — MIDAZOLAM HCL 2 MG/2ML IJ SOLN
1.0000 mg | INTRAMUSCULAR | Status: DC | PRN
  Administered 2019-08-19: 22:00:00 1 mg via INTRAVENOUS
  Filled 2019-08-19: qty 2

## 2019-08-19 MED ORDER — PRISMASOL BGK 4/2.5 32-4-2.5 MEQ/L IV SOLN
INTRAVENOUS | Status: DC
  Filled 2019-08-19 (×57): qty 5000

## 2019-08-19 MED ORDER — SODIUM CHLORIDE 0.9 % IV SOLN: INTRAVENOUS | Status: DC | PRN

## 2019-08-19 MED ORDER — NOREPINEPHRINE 4 MG/250ML-% IV SOLN
0.0000 ug/min | INTRAVENOUS | Status: DC
  Administered 2019-08-19: 2 ug/min via INTRAVENOUS
  Administered 2019-08-20: 03:00:00 22 ug/min via INTRAVENOUS
  Administered 2019-08-20: 12:00:00 14 ug/min via INTRAVENOUS
  Administered 2019-08-20 (×3): 12 ug/min via INTRAVENOUS
  Administered 2019-08-21: 13:00:00 4 ug/min via INTRAVENOUS
  Administered 2019-08-22: 9 ug/min via INTRAVENOUS
  Administered 2019-08-22: 11:00:00 3.013 ug/min via INTRAVENOUS
  Administered 2019-08-23: 8 ug/min via INTRAVENOUS
  Administered 2019-08-23: 10 ug/min via INTRAVENOUS
  Administered 2019-08-23: 14 ug/min via INTRAVENOUS
  Administered 2019-08-24: 02:00:00 11 ug/min via INTRAVENOUS
  Administered 2019-08-24: 10:00:00 6 ug/min via INTRAVENOUS
  Administered 2019-08-25: 11:00:00 2 ug/min via INTRAVENOUS
  Administered 2019-08-25 – 2019-08-26 (×2): 8 ug/min via INTRAVENOUS
  Filled 2019-08-19: qty 500
  Filled 2019-08-19 (×15): qty 250

## 2019-08-19 MED ORDER — FENTANYL CITRATE (PF) 100 MCG/2ML IJ SOLN
25.0000 ug | INTRAMUSCULAR | Status: DC | PRN
  Administered 2019-08-21: 15:00:00 100 ug via INTRAVENOUS
  Filled 2019-08-19: qty 2

## 2019-08-19 MED ORDER — PROTAMINE SULFATE 10 MG/ML IV SOLN
INTRAVENOUS | Status: AC
  Filled 2019-08-19: qty 5

## 2019-08-19 MED ORDER — OXYCODONE HCL 5 MG PO TABS
5.0000 mg | ORAL_TABLET | Freq: Three times a day (TID) | ORAL | Status: DC | PRN
  Administered 2019-08-19: 06:00:00 5 mg via ORAL
  Filled 2019-08-19: qty 1

## 2019-08-19 MED ORDER — ONDANSETRON HCL 4 MG/2ML IJ SOLN
INTRAMUSCULAR | Status: DC | PRN
  Administered 2019-08-19: 4 mg via INTRAVENOUS

## 2019-08-19 MED ORDER — METHOCARBAMOL 500 MG PO TABS
500.0000 mg | ORAL_TABLET | Freq: Three times a day (TID) | ORAL | Status: DC
  Administered 2019-08-19 – 2019-08-20 (×2): 500 mg via ORAL
  Filled 2019-08-19 (×11): qty 1

## 2019-08-19 MED ORDER — VASOPRESSIN 20 UNIT/ML IV SOLN
INTRAVENOUS | Status: DC | PRN
  Administered 2019-08-19 (×7): 1 [IU] via INTRAVENOUS

## 2019-08-19 MED ORDER — AMIODARONE IV BOLUS ONLY 150 MG/100ML
150.0000 mg | Freq: Once | INTRAVENOUS | Status: AC
  Administered 2019-08-19: 04:00:00 150 mg via INTRAVENOUS
  Filled 2019-08-19: qty 100

## 2019-08-19 MED ORDER — AMIODARONE HCL IN DEXTROSE 360-4.14 MG/200ML-% IV SOLN
60.0000 mg/h | INTRAVENOUS | Status: DC
  Administered 2019-08-19 (×2): 60 mg/h via INTRAVENOUS
  Filled 2019-08-19: qty 200

## 2019-08-19 MED ORDER — 0.9 % SODIUM CHLORIDE (POUR BTL) OPTIME
TOPICAL | Status: DC | PRN
  Administered 2019-08-19: 2000 mL

## 2019-08-19 MED ORDER — PRISMASOL BGK 4/2.5 32-4-2.5 MEQ/L REPLACEMENT SOLN
Status: DC
  Filled 2019-08-19 (×10): qty 5000

## 2019-08-19 MED ORDER — ONDANSETRON HCL 4 MG/2ML IJ SOLN
INTRAMUSCULAR | Status: AC
  Filled 2019-08-19: qty 2

## 2019-08-19 MED ORDER — FENTANYL 2500MCG IN NS 250ML (10MCG/ML) PREMIX INFUSION
25.0000 ug/h | INTRAVENOUS | Status: DC
  Administered 2019-08-19: 100 ug/h via INTRAVENOUS
  Administered 2019-08-20: 20:00:00 50 ug/h via INTRAVENOUS
  Administered 2019-08-21: 90 ug/h via INTRAVENOUS
  Administered 2019-08-22: 300 ug/h via INTRAVENOUS
  Administered 2019-08-22: 250 ug/h via INTRAVENOUS
  Administered 2019-08-22 – 2019-08-23 (×3): 150 ug/h via INTRAVENOUS
  Administered 2019-08-24: 75 ug/h via INTRAVENOUS
  Filled 2019-08-19 (×8): qty 250

## 2019-08-19 MED ORDER — HEPARIN SODIUM (PORCINE) 1000 UNIT/ML IJ SOLN
INTRAMUSCULAR | Status: DC | PRN
  Administered 2019-08-19: 8500 [IU] via INTRAVENOUS

## 2019-08-19 MED ORDER — ARTIFICIAL TEARS OPHTHALMIC OINT
TOPICAL_OINTMENT | OPHTHALMIC | Status: AC
  Filled 2019-08-19: qty 3.5

## 2019-08-19 MED ORDER — CALCIUM CHLORIDE 10 % IV SOLN
INTRAVENOUS | Status: DC | PRN
  Administered 2019-08-19 (×10): 100 mg via INTRAVENOUS

## 2019-08-19 MED ORDER — HEMOSTATIC AGENTS (NO CHARGE) OPTIME
TOPICAL | Status: DC | PRN
  Administered 2019-08-19: 1 via TOPICAL

## 2019-08-19 MED ORDER — FENTANYL 2500MCG IN NS 250ML (10MCG/ML) PREMIX INFUSION: 0.0000 ug/h | INTRAVENOUS | Status: DC

## 2019-08-19 MED ORDER — FENTANYL CITRATE (PF) 100 MCG/2ML IJ SOLN: 25.0000 ug | INTRAMUSCULAR | Status: DC | PRN

## 2019-08-19 MED ORDER — AMIODARONE HCL IN DEXTROSE 360-4.14 MG/200ML-% IV SOLN
30.0000 mg/h | INTRAVENOUS | Status: DC
  Administered 2019-08-20 – 2019-08-26 (×13): 30 mg/h via INTRAVENOUS
  Filled 2019-08-19 (×13): qty 200

## 2019-08-19 MED ORDER — MILRINONE LACTATE IN DEXTROSE 20-5 MG/100ML-% IV SOLN
0.2500 ug/kg/min | INTRAVENOUS | Status: DC
  Filled 2019-08-19: qty 100

## 2019-08-19 MED ORDER — SODIUM ZIRCONIUM CYCLOSILICATE 5 G PO PACK
15.0000 g | PACK | ORAL | Status: AC
  Administered 2019-08-19: 15 g via ORAL
  Filled 2019-08-19: qty 1

## 2019-08-19 MED ORDER — AMIODARONE HCL IN DEXTROSE 360-4.14 MG/200ML-% IV SOLN
INTRAVENOUS | Status: AC
  Filled 2019-08-19: qty 200

## 2019-08-19 MED ORDER — FENTANYL CITRATE (PF) 100 MCG/2ML IJ SOLN
INTRAMUSCULAR | Status: AC
  Filled 2019-08-19: qty 2

## 2019-08-19 MED ORDER — MIDAZOLAM HCL 2 MG/2ML IJ SOLN
1.0000 mg | INTRAMUSCULAR | Status: DC | PRN
  Administered 2019-08-19: 1 mg via INTRAVENOUS
  Filled 2019-08-19: qty 2

## 2019-08-19 MED ORDER — NOREPINEPHRINE 4 MG/250ML-% IV SOLN
INTRAVENOUS | Status: AC
  Filled 2019-08-19: qty 250

## 2019-08-19 MED ORDER — PROPOFOL 10 MG/ML IV BOLUS
INTRAVENOUS | Status: DC | PRN
  Administered 2019-08-19: 100 mg via INTRAVENOUS

## 2019-08-19 MED ORDER — CALCIUM CHLORIDE 10 % IV SOLN
INTRAVENOUS | Status: AC
  Filled 2019-08-19: qty 10

## 2019-08-19 MED ORDER — LIDOCAINE HCL (PF) 1 % IJ SOLN
INTRAMUSCULAR | Status: AC
  Filled 2019-08-19: qty 30

## 2019-08-19 MED ORDER — HEPARIN SODIUM (PORCINE) 1000 UNIT/ML IJ SOLN
INTRAMUSCULAR | Status: AC
  Filled 2019-08-19: qty 1

## 2019-08-19 MED ORDER — AMIODARONE LOAD VIA INFUSION
150.0000 mg | Freq: Once | INTRAVENOUS | Status: DC
  Filled 2019-08-19: qty 83.34

## 2019-08-19 MED ORDER — ALBUTEROL SULFATE (2.5 MG/3ML) 0.083% IN NEBU
2.5000 mg | INHALATION_SOLUTION | RESPIRATORY_TRACT | Status: DC
  Administered 2019-08-19 – 2019-08-21 (×11): 2.5 mg via RESPIRATORY_TRACT
  Filled 2019-08-19 (×11): qty 3

## 2019-08-19 MED ORDER — SODIUM CHLORIDE 0.9 % IV SOLN
INTRAVENOUS | Status: DC | PRN
  Administered 2019-08-19: 500 mL

## 2019-08-19 MED ORDER — FENTANYL CITRATE (PF) 250 MCG/5ML IJ SOLN
INTRAMUSCULAR | Status: DC | PRN
  Administered 2019-08-19: 50 ug via INTRAVENOUS
  Administered 2019-08-19: 150 ug via INTRAVENOUS

## 2019-08-19 MED ORDER — HEPARIN (PORCINE) 2000 UNITS/L FOR CRRT
INTRAVENOUS_CENTRAL | Status: DC | PRN
  Filled 2019-08-19: qty 1000

## 2019-08-19 MED ORDER — DOBUTAMINE IN D5W 4-5 MG/ML-% IV SOLN
2.5000 ug/kg/min | INTRAVENOUS | Status: DC
  Administered 2019-08-19: 2.5 ug/kg/min via INTRAVENOUS
  Filled 2019-08-19: qty 250

## 2019-08-19 MED ORDER — PHENYLEPHRINE 40 MCG/ML (10ML) SYRINGE FOR IV PUSH (FOR BLOOD PRESSURE SUPPORT)
PREFILLED_SYRINGE | INTRAVENOUS | Status: DC | PRN
  Administered 2019-08-19: 120 ug via INTRAVENOUS

## 2019-08-19 MED ORDER — CEFAZOLIN SODIUM-DEXTROSE 2-3 GM-%(50ML) IV SOLR
INTRAVENOUS | Status: DC | PRN
  Administered 2019-08-19: 2 g via INTRAVENOUS

## 2019-08-19 MED ORDER — MIDAZOLAM HCL 2 MG/2ML IJ SOLN: 1.0000 mg | INTRAMUSCULAR | Status: DC | PRN

## 2019-08-19 MED ORDER — DEXTRAN 40 IN SALINE 10-0.9 % IV SOLN
INTRAVENOUS | Status: AC
  Filled 2019-08-19: qty 500

## 2019-08-19 MED ORDER — SENNOSIDES 8.8 MG/5ML PO SYRP
5.0000 mL | ORAL_SOLUTION | Freq: Two times a day (BID) | ORAL | Status: DC | PRN
  Administered 2019-08-24 – 2019-08-27 (×3): 5 mL
  Filled 2019-08-19 (×3): qty 5

## 2019-08-19 MED ORDER — CISATRACURIUM BESYLATE (PF) 10 MG/5ML IV SOLN
INTRAVENOUS | Status: DC | PRN
  Administered 2019-08-19: 8 mg via INTRAVENOUS

## 2019-08-19 SURGICAL SUPPLY — 69 items
BAG DECANTER FOR FLEXI CONT (MISCELLANEOUS) ×3 IMPLANT
CANISTER SUCT 3000ML PPV (MISCELLANEOUS) ×3 IMPLANT
CANNULA VESSEL 3MM 2 BLNT TIP (CANNULA) ×7 IMPLANT
CATH ROBINSON RED A/P 18FR (CATHETERS) ×3 IMPLANT
CLIP VESOCCLUDE MED 24/CT (CLIP) ×1 IMPLANT
CLIP VESOCCLUDE MED 6/CT (CLIP) ×2 IMPLANT
CLIP VESOCCLUDE SM WIDE 24/CT (CLIP) ×3 IMPLANT
CLIP VESOCCLUDE SM WIDE 6/CT (CLIP) ×2 IMPLANT
COVER PROBE W GEL 5X96 (DRAPES) IMPLANT
COVER SURGICAL LIGHT HANDLE (MISCELLANEOUS) ×3 IMPLANT
COVER WAND RF STERILE (DRAPES) ×6 IMPLANT
DERMABOND ADVANCED (GAUZE/BANDAGES/DRESSINGS) ×2
DERMABOND ADVANCED .7 DNX12 (GAUZE/BANDAGES/DRESSINGS) ×1 IMPLANT
DRAIN CHANNEL 15F RND FF W/TCR (WOUND CARE) ×2 IMPLANT
DRAPE C-ARM 42X72 X-RAY (DRAPES) IMPLANT
DRAPE CHEST BREAST 15X10 FENES (DRAPES) ×3 IMPLANT
ELECT REM PT RETURN 9FT ADLT (ELECTROSURGICAL) ×3
ELECTRODE REM PT RTRN 9FT ADLT (ELECTROSURGICAL) ×1 IMPLANT
EVACUATOR SILICONE 100CC (DRAIN) ×2 IMPLANT
GAUZE 4X4 16PLY RFD (DISPOSABLE) ×3 IMPLANT
GAUZE SPONGE 2X2 8PLY STRL LF (GAUZE/BANDAGES/DRESSINGS) ×1 IMPLANT
GLOVE BIO SURGEON STRL SZ 6.5 (GLOVE) ×2 IMPLANT
GLOVE BIO SURGEON STRL SZ7.5 (GLOVE) ×6 IMPLANT
GLOVE BIO SURGEONS STRL SZ 6.5 (GLOVE) ×2
GLOVE BIOGEL PI IND STRL 6.5 (GLOVE) IMPLANT
GLOVE BIOGEL PI IND STRL 8 (GLOVE) ×2 IMPLANT
GLOVE BIOGEL PI INDICATOR 6.5 (GLOVE) ×4
GLOVE BIOGEL PI INDICATOR 8 (GLOVE) ×4
GLOVE ECLIPSE 6.5 STRL STRAW (GLOVE) ×2 IMPLANT
GOWN STRL REUS W/ TWL LRG LVL3 (GOWN DISPOSABLE) ×6 IMPLANT
GOWN STRL REUS W/TWL LRG LVL3 (GOWN DISPOSABLE) ×12
HEMOSTAT SPONGE AVITENE ULTRA (HEMOSTASIS) ×2 IMPLANT
KIT BASIN OR (CUSTOM PROCEDURE TRAY) ×6 IMPLANT
KIT SHUNT ARGYLE CAROTID ART 6 (VASCULAR PRODUCTS) IMPLANT
KIT TURNOVER KIT B (KITS) ×6 IMPLANT
NDL 18GX1X1/2 (RX/OR ONLY) (NEEDLE) IMPLANT
NDL HYPO 25GX1X1/2 BEV (NEEDLE) ×2 IMPLANT
NEEDLE 18GX1X1/2 (RX/OR ONLY) (NEEDLE) IMPLANT
NEEDLE 22X1 1/2 (OR ONLY) (NEEDLE) ×3 IMPLANT
NEEDLE HYPO 25GX1X1/2 BEV (NEEDLE) ×6 IMPLANT
NS IRRIG 1000ML POUR BTL (IV SOLUTION) ×9 IMPLANT
PACK CAROTID (CUSTOM PROCEDURE TRAY) ×3 IMPLANT
PACK SURGICAL SETUP 50X90 (CUSTOM PROCEDURE TRAY) ×3 IMPLANT
PAD ARMBOARD 7.5X6 YLW CONV (MISCELLANEOUS) ×12 IMPLANT
PATCH VASC XENOSURE 1CMX6CM (Vascular Products) ×2 IMPLANT
PATCH VASC XENOSURE 1X6 (Vascular Products) IMPLANT
POSITIONER HEAD DONUT 9IN (MISCELLANEOUS) ×3 IMPLANT
SET WALTER ACTIVATION W/DRAPE (SET/KITS/TRAYS/PACK) IMPLANT
SHUNT CAROTID BYPASS 10 (VASCULAR PRODUCTS) ×2 IMPLANT
SHUNT CAROTID BYPASS 12 (VASCULAR PRODUCTS) IMPLANT
SOAP 2 % CHG 4 OZ (WOUND CARE) ×3 IMPLANT
SPONGE GAUZE 2X2 STER 10/PKG (GAUZE/BANDAGES/DRESSINGS) ×2
SPONGE SURGIFOAM ABS GEL 100 (HEMOSTASIS) IMPLANT
SUT ETHILON 3 0 PS 1 (SUTURE) ×5 IMPLANT
SUT PROLENE 5 0 C 1 24 (SUTURE) ×2 IMPLANT
SUT PROLENE 6 0 BV (SUTURE) ×6 IMPLANT
SUT SILK 2 0 PERMA HAND 18 BK (SUTURE) IMPLANT
SUT VIC AB 3-0 SH 27 (SUTURE) ×4
SUT VIC AB 3-0 SH 27X BRD (SUTURE) ×1 IMPLANT
SUT VICRYL 4-0 PS2 18IN ABS (SUTURE) ×4 IMPLANT
SYR 10ML LL (SYRINGE) IMPLANT
SYR 20ML LL LF (SYRINGE) ×3 IMPLANT
SYR 30ML LL (SYRINGE) IMPLANT
SYR 5ML LL (SYRINGE) IMPLANT
SYR CONTROL 10ML LL (SYRINGE) ×6 IMPLANT
TAPE CLOTH SURG 4X10 WHT LF (GAUZE/BANDAGES/DRESSINGS) ×2 IMPLANT
TOWEL GREEN STERILE (TOWEL DISPOSABLE) ×6 IMPLANT
TRAY FOLEY MTR SLVR 16FR STAT (SET/KITS/TRAYS/PACK) ×2 IMPLANT
WATER STERILE IRR 1000ML POUR (IV SOLUTION) ×6 IMPLANT

## 2019-08-19 NOTE — Progress Notes (Signed)
[facility] Progress Note Patient Name: Lance Cook DOB: [birth year] MRN: [id number]   Date of Service  08/16/2019  HPI/Events of Note  Ventilator asynchrony -   eICU Interventions  Will order: 1. Increase ceiling on Fentanyl IV infusion to 300 mcg/hour. 2. Increase frequency of Versed 1 mg IV to Q 1 hour PRN.      Intervention Category Major Interventions: Respiratory failure - evaluation and management  [name] 08/27/2019, 10:44 PM

## 2019-08-19 NOTE — Anesthesia Procedure Notes (Signed)
Procedure Name: Intubation Date/Time: 09/01/2019 12:12 PM Performed by: Alain Marion, CRNA Pre-anesthesia Checklist: Patient identified, Emergency Drugs available, Suction available and Patient being monitored Patient Re-evaluated:Patient Re-evaluated prior to induction Oxygen Delivery Method: Circle System Utilized Preoxygenation: Pre-oxygenation with 100% oxygen Induction Type: IV induction Ventilation: Mask ventilation without difficulty Laryngoscope Size: Miller and 2 Grade View: Grade I Tube type: Oral Tube size: 7.0 mm Number of attempts: 1 Airway Equipment and Method: Stylet and Oral airway Placement Confirmation: ETT inserted through vocal cords under direct vision,  positive ETCO2 and breath sounds checked- equal and bilateral Secured at: 22 cm Tube secured with: Tape Dental Injury: Teeth and Oropharynx as per pre-operative assessment

## 2019-08-19 NOTE — Anesthesia Procedure Notes (Signed)
Arterial Line Insertion Start/End12/17/2020 12:10 PM, 08/19/2019 12:20 PM Performed by: Murvin Natal, MD, anesthesiologist  Patient location: OR. Preanesthetic checklist: patient identified, IV checked, site marked, risks and benefits discussed, surgical consent, monitors and equipment checked, pre-op evaluation, timeout performed and anesthesia consent Patient sedated Left, radial was placed Catheter size: 20 Fr Hand hygiene performed , maximum sterile barriers used  and Seldinger technique used  Attempts: 1 Procedure performed using ultrasound guided technique. Ultrasound Notes:anatomy identified, needle tip was noted to be adjacent to the nerve/plexus identified and no ultrasound evidence of intravascular and/or intraneural injection Following insertion, dressing applied and Biopatch. Post procedure assessment: normal and unchanged  Patient tolerated the procedure well with no immediate complications.

## 2019-08-19 NOTE — Transfer of Care (Signed)
Immediate Anesthesia Transfer of Care Note  Patient: Izeiah Silverstone  Procedure(s) Performed: REMOVAL OF DIALYSIS CATHTER FROM RIGHT CAROTID BIFURCATION, REPAIR OF RIGHT CAROTID ARTERY WITH BOVINE PATCH (Right Neck)  Patient Location: ICU  Anesthesia Type:General  Level of Consciousness: Patient remains intubated per anesthesia plan  Airway & Oxygen Therapy: Patient remains intubated per anesthesia plan and Patient placed on Ventilator (see vital sign flow sheet for setting)  Post-op Assessment: Report given to RN and Post -op Vital signs reviewed and stable  Post vital signs: Reviewed and stable  Last Vitals:  Vitals Value Taken Time  BP 132/80   Temp    Pulse 83 08/18/2019 1524  Resp 33 08/13/2019 1524  SpO2 100 % 08/15/2019 1524  Vitals shown include unvalidated device data.  Last Pain:  Vitals:   08/08/2019 1149  TempSrc: Oral  PainSc:       Patients Stated Pain Goal: 3 ([id number])  Complications: No apparent anesthesia complications

## 2019-08-19 NOTE — Progress Notes (Signed)
Pt had a 13 beat run of Vent Fib/Tach. Pt denied any chest pain. Pt claimed to be short of breath and wanted to let his legs hang off the bed. While on the side of the bed pt kept sliding towards to edge and could not reach the ground with his feet. Pt quickly transferred to chair to prevent him from sliding off the bed. Once in the chair pt's HR went to 130's. Pt continued to deny and chest pain. Pt moved back to bed and Elink RN camera'd in. 12-Lead EKG showed ST with a right bundle branch block. Amio bolus was ordered and given. Pt's HR went back into a-fib in the 70-80's. Will continue to assess.

## 2019-08-19 NOTE — Progress Notes (Addendum)
[facility] NEPHROLOGY PROGRESS NOTE  Assessment/ Plan: Pt is a 58 y.o. yo male CHF, mechanical mitral and aortic valve A. fib, VT arrest, DM, potassium of 8.4 associated with hypotension and AKI on admission.  #Severe hyperkalemia with EKG changes due to use of spironolactone, KCl and AKI: Received urgent dialysis with improvement in potassium level.  K5.6 today. -Order a dose of Lokelma today.   -Monitor lab -DC Aldactone, KCl. -Significant fluid overload, BP soft.  Starting CRRT.  Monitor lites.  #Acute on CKD stage III: Baseline creatinine around 1.3-1.6, likely hemodynamically mediated and hypotension.  Nonoliguric.  Monitor BMP.  #Acute on chronic hyponatremia: Probably due to diuretics.  He was on metolazone, torsemide and Aldactone at home.  Monitor sodium level.  #Acute on Chronic CHF, aortic and mitral valve replacement: Per cardiology  #Hypotension: Blood pressure is improving.  On low dose dopamine.  Discussed with ICU team and cardiology.  Subjective: Seen and examined at bedside.  Able to lie flat.  Comfortable after receiving pain medication.  Denies nausea vomiting.  Had HD yesterday for about 2 hours with 1K bath.  Going to the OR today for removal of central line. Objective Vital signs in last 24 hours: Vitals:   08/16/2019 0700 08/04/2019 0715 08/08/2019 0730 08/25/2019 0800  BP: (!) 86/44 (!) 79/35 (!) 82/40   Pulse: 75 75 71 83  Resp: (!) 31 (!) 33 (!) 23 20  Temp:   98.2 F (36.8 C)   TempSrc:   Axillary   SpO2: 97% 98% 98% 96%  Weight:      Height:       Weight change:   Intake/Output Summary (Last 24 hours) at 08/21/2019 0855 Last data filed at 08/02/2019 0700 Gross per 24 hour  Intake 2857.82 ml  Output 1450 ml  Net 1407.82 ml       Labs: Basic Metabolic Panel: Recent Labs  Lab 08/17/2019 1624 08/11/2019 0048 08/06/2019 0401 08/11/2019 0556  NA 119* 123* 125* 125*  K >7.5* 6.9* 5.6* 5.7*  CL 85* 87* 86*  --   CO2 22 25 29   --    GLUCOSE 81 119* 110*  --   BUN 71* 61* 56*  --   CREATININE 2.54* 2.05* 1.99*  --   CALCIUM 7.8* 8.3* 8.3*  --   PHOS 5.8*  --   --   --    Liver Function Tests: Recent Labs  Lab 08/14/2019 0048 08/16/2019 0401  AST 34 25  ALT 16 15  ALKPHOS 54 59  BILITOT 0.5 1.0  PROT 5.3* 5.4*  ALBUMIN 2.3* 2.4*   No results for input(s): LIPASE, AMYLASE in the last 168 hours. No results for input(s): AMMONIA in the last 168 hours. CBC: Recent Labs  Lab 08/16/2019 1424 08/25/2019 1639 08/24/2019 0048  WBC 19.1*  --  25.3*  NEUTROABS 17.0*  --   --   HGB 9.6*  11.2* 10.2* 11.0*  HCT 32.7*  33.0* 30.0* 36.8*  MCV 71.1*  --  70.5*  PLT 233  --  221   Cardiac Enzymes: No results for input(s): CKTOTAL, CKMB, CKMBINDEX, TROPONINI in the last 168 hours. CBG: Recent Labs  Lab 08/29/2019 1737 08/11/2019 1939 08/14/2019 2330 08/12/2019 0454 09/01/2019 0749  GLUCAP 144* 98 106* 104* 100*    Iron Studies: No results for input(s): IRON, TIBC, TRANSFERRIN, FERRITIN in the last 72 hours. Studies/Results: DG Chest Portable 1 View  Result Date: 08/15/2019 CLINICAL DATA:  Central line placement EXAM: PORTABLE CHEST  1 VIEW COMPARISON:  Earlier same day FINDINGS: Left internal jugular central line has its tip in the SVC above the right atrium. No pneumothorax. Right internal jugular central line remains in place, crossing the midline towards the left. As noted previously, arterial placement not excluded. Pacemaker/AICD remains in place. Chronic cardiomegaly with mitral valve replacement. Persistent bibasilar atelectasis. IMPRESSION: New left internal jugular central line tip in the SVC above the right atrium. No pneumothorax. No other change. Electronically Signed   By: Nelson Chimes M.D.   On: 08/15/2019 17:21   DG Chest Port 1 View  Result Date: 08/26/2019 CLINICAL DATA:  Central line placement. EXAM: PORTABLE CHEST 1 VIEW COMPARISON:  08/25/2019 at 2:33 p.m. FINDINGS: Right internal jugular central venous  line has placed. Tip extends across the midline to the left. This could be arterial in location. Lung appearance is stable from prior exam. There is no pneumothorax. IMPRESSION: 1. New right internal jugular central venous line tip crosses the right neck base to the left upper mediastinum. This is concerning for placement within the innominate artery. It does not track towards the SVC. Critical Value/emergent results were called by telephone at the time of interpretation on 08/28/2019 at 4:25 pm to emergency room physician, who verbally acknowledged these results. 2. No pneumothorax.  No other change from the earlier study. Electronically Signed   By: Lajean Manes M.D.   On: 08/17/2019 16:25   DG Chest Portable 1 View  Result Date: 08/23/2019 CLINICAL DATA:  Bradycardia. Hypotension. EXAM: PORTABLE CHEST 1 VIEW COMPARISON:  06/29/2019 FINDINGS: There is chronic cardiomegaly. Prosthetic mitral valve. AICD in place. Chronic prominence of the main pulmonary artery. Slight atelectasis at the right lung base. The small right effusion seen on the prior study is not apparent on today's exam. Left lung is clear. No acute bone abnormality. IMPRESSION: 1. Chronic cardiomegaly. 2. Slight atelectasis at the right lung base. 3. The small right effusion seen on the prior study is not apparent on today's exam. Electronically Signed   By: Lorriane Shire M.D.   On: 08/14/2019 14:50    Medications: Infusions: . sodium chloride 50 mL/hr at 08/16/2019 0700  . DOPamine 10 mcg/kg/min (08/12/2019 1639)    Scheduled Medications: . sodium chloride   Intravenous Once  . amiodarone  200 mg Oral Daily  . Chlorhexidine Gluconate Cloth  6 each Topical Q0600  . insulin aspart  2-6 Units Subcutaneous Q4H  . methocarbamol  500 mg Oral TID  . pantoprazole  40 mg Oral Daily  . sodium zirconium cyclosilicate  15 g Oral NOW    have reviewed scheduled and prn medications.  Physical Exam: General:NAD, comfortable Heart:RRR, s1s2  nl Lungs:clear b/l, no crackle Abdomen:soft, Non-tender, non-distended Extremities:No edema Dialysis Access: Left IJ temporary catheter.  Warrene Kapfer Prasad Ron Junco 08/03/2019,8:55 AM  LOS: 1 day  Pager: [phone]

## 2019-08-19 NOTE — Anesthesia Postprocedure Evaluation (Signed)
Anesthesia Post Note  Patient: Ayren Stalnaker  Procedure(s) Performed: REMOVAL OF DIALYSIS CATHTER FROM RIGHT CAROTID BIFURCATION, REPAIR OF RIGHT CAROTID ARTERY WITH BOVINE PATCH (Right Neck)     Patient location during evaluation: ICU Anesthesia Type: General Level of consciousness: sedated Pain management: pain level controlled Vital Signs Assessment: post-procedure vital signs reviewed and stable Respiratory status: patient remains intubated per anesthesia plan Cardiovascular status: stable Postop Assessment: no apparent nausea or vomiting Anesthetic complications: no    Last Vitals:  Vitals:   08/15/2019 1149 08/23/2019 1510  BP: (!) 113/54   Pulse: 85 76  Resp:  (!) 21  Temp: 37.1 C 36.7 C  SpO2: 100% 100%    Last Pain:  Vitals:   08/28/2019 1510  TempSrc: Axillary  PainSc:                  Karyl Kinnier Tashiya Souders

## 2019-08-19 NOTE — Progress Notes (Signed)
[place] [facility] Progress Note Patient Name: Lance Cook DOB: [birth year] MRN: [id number]   Date of Service  08/18/2019  HPI/Events of Note  Patient is in a wide complex tachycardia - BP = 117/65 with MAP = 82. He has been in this rhythm before. EP was consulted and felt that it was monomorphic VT with a rate of 200. Cardioversion X 3 failed. He reverted back to AFIB after Amiodarone IV boluses. He is on chronic oral Amiodarone Rx.  eICU Interventions  Will order: 1. BMP and Mg++ level now.  2. Amiodarone 150 mg IV over 10 minutes now.      Intervention Category Major Interventions: Arrhythmia - evaluation and management  Adyson Vanburen [name] 08/08/2019, 3:52 AM

## 2019-08-19 NOTE — Progress Notes (Signed)
Pt says that he cannot breath. He says that he is "gargeling and short of breath". SpO2 is in the 90's. Pt sat up more in bed, but lowers himself down because he is uncomfortable. Pt given incentive spirometer and instructed on how to use it, but pt claims that he cannot and does not try. RN offered to put pt back on Bi-Pap, pt refuses.  Pt continues to suction out his mouth with [place]. Pt continues to call out for help and claim that he cannot breath, but refuses all interventions. Will continue to assess.

## 2019-08-19 NOTE — Progress Notes (Signed)
Critical Care Attending Note:   R returns from operating room intubated following repair of iatrogenic carotid injury.  Episode of ventricular tachycardia with agitation.  On examination he is awake and orients to voice but is not following commands.  His chest remains rhonchorous.  We will keep him intubated and initiate CRRT.  We will start dobutamine for inotropic support. Volume removal should facilitate potential extubation as early as tomorrow.  Kipp Brood, MD [facility] ICU Physician [place]  Pager: [phone] Mobile: [phone] After hours: [phone].  08/08/2019, 3:49 PM

## 2019-08-19 NOTE — Progress Notes (Signed)
VASCULAR SURGERY:  As per Dr. Claretha Cooper note, the patient has a dialysis catheter in the arterial system on the right.  He will require urgent removal of the catheter and repair of the artery.  I have explained that if the artery is significantly injured we may have to do a patch angioplasty possibly using the vein.  In addition I explained that there is a very small chance that if the catheter enters the artery in the chest that he would require a median sternotomy for repair.  I think this is unlikely.  I explained that the main risks are bleeding and a small risk of stroke given that the catheter is in the carotid artery.  He is receiving 2 additional units of FFP and we will proceed soon.  I have discussed this all with the patient and with his wife.  Deitra Mayo, MD Office: [phone]

## 2019-08-19 NOTE — Progress Notes (Addendum)
NAME:  Lance Cook, MRN:  [id number], DOB:  February 19, 1961, LOS: 1 ADMISSION DATE:  08/31/2019, CONSULTATION DATE:  08/12/2019 REFERRING MD:  Dr Sherry Ruffing, CHIEF COMPLAINT:  Hyperkalemia    Brief History   58 yo male found to have severe life-threatening hyperkalemia, PCCM consulted for admission and need for urgent iHD.  History of present illness   58 year old man, history of Bentall procedure, MVR, atrial fibrillation, CAD with chronic systolic and diastolic CHF.  Came to the ER with fatigue, weakness and hypotension.  He was found to be profoundly bradycardic with severe left bundle branch block and QRS widening on EKG.  Found to be severely hyperkalemic with new acute renal failure.  He is on digoxin at baseline and his digoxin level was 2.6.  He will be admitted emergently to the ICU for hemodialysis.  Past Medical History   has a past medical history of Anemia, Anxiety, ARDS (adult respiratory distress syndrome) ([place]) (01/27/2015), Arthritis, Ascending aortic dissection (HCC) (07/14/2008), Atrial fibrillation ([place]), Bell's palsy, C. difficile diarrhea, CAD (coronary artery disease), Cellulitis (04/02/2014), Cerebral artery occlusion with cerebral infarction ([place]) (10/08/2011), Chronic diastolic heart failure ([place]) (03/01/2015), CVA (cerebral vascular accident) ([place]) (10/08/2011), DIABETES MELLITUS, TYPE II (11/01/2009), Diverticulosis, 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), Gastric polyps, GERD (gastroesophageal reflux disease), Gout, Hiatal hernia, History of colon polyps (12/23/2011), Hyperlipidemia, Hypertension, Hypogonadism male (04/08/2012), Impaired glucose tolerance (10/08/2011), Morbid obesity ([place]) (04/02/2014), OSA (obstructive sleep apnea), Peripheral vascular disease ([place]), 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 ([id number]), 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   Significant Hospital Events   12/17 Admitted, urgent HD  Consults:  Nephrology  Procedures:  R IJ HD catheter 12/17 >> Misplaced, plans to remove in OR 12/18  L IJ HD catheter 12/17 >>   Significant Diagnostic Tests:  NM cardiac amyloid scan 10/21 >> equivocal for any evidence anmyloidosis  ECHO 12/18 >  1. Left ventricular ejection fraction, by visual estimation, is 30 to 35%. The left ventricle has severely decreased function. Severely increased left ventricular size. There is no left ventricular hypertrophy.  2. Definity contrast agent was given IV to delineate the left ventricular endocardial borders.  3. Global hypokinesis, though function more preserved at base than apex. LV contrast excludes apical thrombus.  4. Global right ventricle has normal systolic function.The right ventricular size is normal. No increase in right ventricular wall thickness.  5. Left atrial size was normal.  6. Right atrial size was normal.  7. The mitral valve has been repaired/replaced. Mild mitral valve regurgitation. Moderate mitral stenosis.  8. The tricuspid valve is normal in structure. Tricuspid valve regurgitation mild-moderate.  9. 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 10. The aortic valve is normal in structure. Aortic valve regurgitation is trivial by color flow Doppler. Structurally normal aortic valve, with no evidence of sclerosis or stenosis. 11. Aortic valve with trivial perivalvular leak. 12. The pulmonic valve was grossly normal. Pulmonic valve regurgitation is not visualized by color flow Doppler. 13. S/P Bentall. 14. Moderately elevated pulmonary artery systolic pressure. 15. A pacer wire is visualized in the RV and RA. 16. The inferior vena cava is dilated in size with <50% respiratory variability, suggesting right atrial  pressure of 15 mmHg. 17. History of mechanical aortic and mitral valves. Leaflets not well visualized, could consider  fluroscopy of leaflets, but gradients not suggestive of obstruction.  CXR 12/17 > New left internal jugular central line tip in the SVC above the right atrium. No pneumothorax. No other change.  Micro Data:  COVID 12/17 > negative  Blood cultures 12/17 >  MRSA PCR 12/17 >negative  Urine culture 12/18 >    Antimicrobials:     Interim history/subjective:  States he feels anxious today with request of oxycodone. Continues to feel weak.   Objective   Blood pressure (!) 82/40, pulse 83, temperature 98.2 F (36.8 C), temperature source Axillary, resp. rate 20, height 5' (1.524 m), weight 85 kg, SpO2 96 %.        Intake/Output Summary (Last 24 hours) at 08/06/2019 0848 Last data filed at 08/02/2019 0700 Gross per 24 hour  Intake 2857.82 ml  Output 1450 ml  Net 1407.82 ml   Filed Weights   08/31/2019 1400 08/03/2019 0500  Weight: 80.3 kg 85 kg    Examination: General: Chronically ill appearing elderly male, mildly anxious, in NAD HEENT: [place]/AT, MM pink/moist, PERRL,  Neuro: Alert and oriented, able to follow all commands, non-focal  CV: s1s2 regular rate and rhythm, no murmur, rubs, or gallops,  PULM:  Posterior expiratory wheeze and faint crackles, no increased work of breathing GI: soft, bowel sounds active in all 4 quadrants, non-tender, non-distended Extremities: warm/dry, lower extremity  edema  Skin: no rashes or lesions  Resolved Hospital Problem list     Assessment & Plan:  Severe life-threatening hyperkalemia  -Due to acute on chronic (stage II-III) renal failure.  Etiology of his renal failure likely includes use of diuretics, poor p.o. intake.  He is on potassium supplementation as well. Plan: Received urgent HD 12/17 Nephrology following  Remain in ICU  Trend Bmet K 5.9 this AM received Lokelma  Continue to hold home diuretics (metolazone and  torsemide)   Digoxin toxicity -Digoxin level 2.6 on admit received Digibind 2 vials Plan: iHD 12/17 as above  Repeat dig level this AM Hold home Digoxin   Chronic systolic and diastolic CHF with CAD Mechanical mitral valve,  Chronic atrial fibrillation AICD/pacer.  -Admitted with severe left bundle branch block, wide-complex EKG, enlarged T waves, severe bradycardia. Again see with wide complex tachycardia overnight requiring Amiodarone bolus  -Baseline anticoagulation with coumadin  Plan: Hold home coumadin and Heparin until after Right HD cath removal  Hold home Aldactone and Lopressor  Continue midodrine  Transition to Milrinone   CVP 22 given significant volume overload will begin CRRT today  Obtain Coox   Shock due to hyperkalemia, bradycardia -Stated on dopamine 12/17 Plan: Wean pressor support as able  Bradycardia improved Cardiology following   Abdominal distension Plan LFTs WNL  Consider ABD [place]   DM type 2 Plan SSI  OSA on CPAP Plan: Continue home CPAP  Misplaced HD catheter -Patient underwent right neck hemodialysis catheter placement, procedure went smoothly.  On review of the chest x-ray however the catheter takes a leftward deflection suggesting malposition. ABG from the catheter confirms that it is arterial. Vascular surgery consulted and plan to remove line in OR 12/18, FFP ordered prior to removal.   Best practice:  Diet: NPO except meds Pain/Anxiety/Delirium protocol (if indicated): NA VAP protocol (if indicated): NA DVT prophylaxis: heparin gtt GI prophylaxis: PPI Glucose control: SI per protocol Mobility: BR Code Status: Full Family Communication: discussed with pt 12/17 Disposition: ICU  Labs   CBC: Recent Labs  Lab 08/17/2019 1424 08/05/2019 1639 08/30/2019 0048  WBC  19.1*  --  25.3*  NEUTROABS 17.0*  --   --   HGB 9.6*  11.2* 10.2* 11.0*  HCT 32.7*  33.0* 30.0* 36.8*  MCV 71.1*  --  70.5*  PLT 233  --  [id number]    Basic Metabolic  Panel: Recent Labs  Lab 08/20/2019 1424 08/25/2019 1624 08/28/2019 2010 08/03/2019 2209 08/03/2019 0048 08/28/2019 0401 08/23/2019 0556  NA 112* 119* 123* 122* 123* 125* 125*  K 8.4* >7.5* 6.5* 6.0* 6.9* 5.6* 5.7*  CL 81* 85*  --   --  87* 86*  --   CO2  --  22  --   --  25 29  --   GLUCOSE 92 81  --   --  119* 110*  --   BUN 98* 71*  --   --  61* 56*  --   CREATININE 2.70* 2.54*  --   --  2.05* 1.99*  --   CALCIUM  --  7.8*  --   --  8.3* 8.3*  --   MG  --  2.7*  --   --  2.6* 2.5*  --   PHOS  --  5.8*  --   --   --   --   --    GFR: Estimated Creatinine Clearance: 36.6 mL/min (A) (by C-G formula based on SCr of 1.99 mg/dL (H)). Recent Labs  Lab 08/23/2019 1424 08/30/2019 1624 08/08/2019 0048  WBC 19.1*  --  25.3*  LATICACIDVEN 1.8 2.0*  --     Liver Function Tests: Recent Labs  Lab 08/22/2019 0048 08/27/2019 0401  AST 34 25  ALT 16 15  ALKPHOS 54 59  BILITOT 0.5 1.0  PROT 5.3* 5.4*  ALBUMIN 2.3* 2.4*   No results for input(s): LIPASE, AMYLASE in the last 168 hours. No results for input(s): AMMONIA in the last 168 hours.  ABG    Component Value Date/Time   PHART 7.406 09/01/2019 1639   PCO2ART 45.8 08/10/2019 1639   PO2ART 75.0 (L) 08/06/2019 1639   HCO3 29.0 (H) 08/21/2019 1639   TCO2 30 08/27/2019 1639   ACIDBASEDEF 1.0 01/26/2015 0425   O2SAT 95.0 08/03/2019 1639     Coagulation Profile: Recent Labs  Lab 08/16/19 0000 08/28/2019 1624 08/17/2019 0556  INR 2.1 5.3* 2.4*    Cardiac Enzymes: No results for input(s): CKTOTAL, CKMB, CKMBINDEX, TROPONINI in the last 168 hours.  HbA1C: Hgb A1c MFr Bld  Date/Time Value Ref Range Status  02/14/2019 06:08 AM 5.2 4.8 - 5.6 % Final    Comment:    (NOTE) Pre diabetes:          5.7%-6.4% Diabetes:              >6.4% Glycemic control for   <7.0% adults with diabetes   01/22/2015 12:43 PM 5.9 (H) 4.8 - 5.6 % Final    Comment:    (NOTE)         Pre-diabetes: 5.7 - 6.4         Diabetes: >6.4         Glycemic control for  adults with diabetes: <7.0     CBG: Recent Labs  Lab 08/28/2019 1737 08/30/2019 1939 08/22/2019 2330 08/12/2019 0454 08/05/2019 0749  GLUCAP 144* 98 106* 104* 100*    Critical care time: [place], NP-C [facility] Contact / Pager information can be found on Amion  08/11/2019, 9:57 AM

## 2019-08-19 NOTE — Progress Notes (Signed)
[place] for Warfarin to Heparin Indication: valve  Allergies  Allergen Reactions  . Feraheme [Ferumoxytol] Shortness Of Breath    SOB/Tachycardia  . Penicillins Hives and Rash    Has patient had a PCN reaction causing immediate rash, facial/tongue/throat swelling, SOB or lightheadedness with hypotension: Yes Has patient had a PCN reaction causing severe rash involving mucus membranes or skin necrosis: Yes Has patient had a PCN reaction that required hospitalization unsure Has patient had a PCN reaction occurring within the last 10 years: 2010-2012 If all of the above answers are "NO", then may proceed with Cephalosporin use.  [name] Other (See Comments)    CDIFF, when taking oral tablets   . Tape Other (See Comments) and Itching    Skin irritation    Patient Measurements: Height: 5' (152.4 cm) Weight: 187 lb 6.3 oz (85 kg) IBW/kg (Calculated) : 50 Heparin Dosing Weight: 71kg  Vital Signs: Temp: 98.8 F (37.1 C) (12/18 1149) Temp Source: Oral (12/18 1149) BP: 113/54 (12/18 1149) Pulse Rate: 85 (12/18 1149)  Labs: Recent Labs    08/27/2019 1424 08/16/2019 1624 08/06/2019 1639 08/12/2019 0048 09/01/2019 0401 08/22/2019 0556 08/14/2019 1233 08/09/2019 1234  HGB 9.6*  11.2*  --  10.2* 11.0*  --   --  10.2*  --   HCT 32.7*  33.0*  --  30.0* 36.8*  --   --  30.0*  --   PLT 233  --   --  221  --   --   --   --   LABPROT  --  48.3*  --   --   --  25.7*  --  24.7*  INR  --  5.3*  --   --   --  2.4*  --  2.2*  CREATININE 2.70* 2.54*  --  2.05* 1.99*  --   --   --   TROPONINIHS  --  33*  --   --   --   --   --   --     Estimated Creatinine Clearance: 36.6 mL/min (A) (by C-G formula based on SCr of 1.99 mg/dL (H)).  Assessment: 58 year old male to begin heparin while Warfarin is on hold for AVR/MVR INR 2.2 today (goal INR 2.5 to 3.5) Beginning CRRT today  Goal of Therapy:  Heparin level 0.3-0.7 units/ml Monitor  platelets by anticoagulation protocol: Yes   Plan:  No heparin bolus  Heparin drip at 900 units / hr Heparin level 8 hours after heparin begins Daily heparin level, CBC  Thank you Anette Guarneri, PharmD 08/15/2019,3:15 PM

## 2019-08-19 NOTE — Op Note (Signed)
    NAME: Lance Cook    MRN: [id number] DOB: 1960-10-26    DATE OF OPERATION: 08/25/2019  PREOP DIAGNOSIS:    Hemodialysis catheter in right carotid artery  POSTOP DIAGNOSIS:    Same  PROCEDURE:    Removal of catheter from right carotid artery Repair of right carotid bifurcation Bovine pericardial patch angioplasty  SURGEON: Judeth Cornfield. Scot Dock, MD  ASSIST: Graylon Good, RNFA  ANESTHESIA: General  EBL: Minimal  INDICATIONS:    Lance Cook is a 58 y.o. male who had a hemodialysis catheter placed yesterday and this was noted to be intra-arterial.  His Coumadin was supratherapeutic and this was reversed and he presents now for removal of the catheter from the artery.  Of note he had another catheter placed on the left side for dialysis.  FINDINGS:   The catheter entered the carotid at the carotid bifurcation.  The artery was fairly free of disease.  I closed this with a bovine pericardial patch to prevent any narrowing.  TECHNIQUE:   The patient was taken to the operating room and an arterial line was placed by anesthesia.  He received a general anesthetic.  The right neck, chest and right groin were prepped and draped in usual sterile fashion.  Catheter was prepped into the field.  An incision was made along the anterior border of the sternocleidomastoid and the dissection carried down to where the catheter entered the carotid artery.  It entered right at the carotid bifurcation.  I was able to control the common carotid artery proximally.  I then controlled the external and internal carotid artery.  The patient was heparinized.  I clamped and divided the catheter and then remove the distal segment and there was no evidence of venous injury or bleeding.  The internal carotid artery and external carotid arteries were then clamped and then the catheter removed and the common carotid artery clamped.  In order to allow adequate exposure and to be sure that there was no intimal injury  I opened the injured area longitudinally.  There was some tearing of the artery medially and I repaired this with 2 interrupted tacking sutures.  There was no significant plaque present.  To close the wound I then used a bovine pericardial patch to prevent any narrowing.  This was sewn with continuous 6-0 Prolene suture.  Prior to completing the patch closure the artery was backbled and flushed appropriately and the anastomosis completed.  Flow was reestablished first to the external carotid artery and into the internal carotid artery.  There is a good Doppler signal and good pulse distal to the repair at the completion.  Hemostasis was obtained in the wounds.  Of note his INR was 2.2 despite 6 units of FFP.  I therefore elected to place a drain.  A 15 Blake drain was placed.  Hemostasis was obtained in the wound.  The deep layer was closed with a running 3-0 Vicryl.  The platysma was closed with running 3-0 Vicryl.  The skin was closed with 4-0 Vicryl.  Dermabond was applied.  Given his debilitated state and as he appeared quite weak anesthesia felt it would be safest to leave him intubated for now.  Deitra Mayo, MD, FACS [facility]  DATE OF DICTATION:   08/07/2019

## 2019-08-19 NOTE — Progress Notes (Signed)
[place] Progress Note Patient Name: Lance Cook DOB: May 25, 1961 MRN: [id number]   Date of Service  08/02/2019  HPI/Events of Note  Pain - Patient requests home Oxycodone IR and Robaxin.   eICU Interventions  Will order: 1. Robaxin 500 mg PO TID. 2. Oxycodone IR 5 mg PO Q 8 hours PRN pain.      Intervention Category Major Interventions: Other:  Sommer,Steven Cornelia Copa 08/30/2019, 6:10 AM

## 2019-08-19 NOTE — Progress Notes (Signed)
   08/10/2019 1955  Clinical Encounter Type  Visited With Patient;Family  Visit Type Initial  Referral From Nurse  Consult/Referral To Chaplain  Spiritual Encounters  Spiritual Needs Emotional  Stress Factors  Family Stress Factors Health changes  This chaplain responded to RN's request for spiritual care for the Pt. wife-[name]. The chaplain introduced herself to the Pt. and was given permission to call the Pt. wife. The chaplain checked in with RN-Corey before she called the Pt. wife at [phone].   The chaplain listened and learned the Pt. and wife have an "incredible relationship" and are celebrating 84yrs of marriage. The chaplain learned because of [name]'s visual challenges, she is unable to be present at the Pt. bedside and prefers compassionate communication by phone.    The chaplain listened as [name] described the Pt. strong spiritual relationship with his pastor.  The chaplain discussed with RN-Jennifer's guidance, plans for a phone call from the Pt. pastor on Saturday.  [name] understands she has to give the pastor her passcode for the communication to be completed.  The chaplain understands Nat Math will be the Pt.'s RN and Anderson Malta will make the necessary arrangements with Saralyn Pilar, Chg.RN.  This chaplain will update the day time chaplain.  F/U spiritual care is available as needed.

## 2019-08-19 NOTE — Anesthesia Preprocedure Evaluation (Addendum)
Anesthesia Evaluation  Patient identified by MRN, date of birth, ID band Patient awake    Reviewed: Allergy & Precautions, NPO status , Patient's Chart, lab work & pertinent test results  Airway Mallampati: II  TM Distance: >3 FB Neck ROM: Full    Dental no notable dental hx.    Pulmonary sleep apnea and Continuous Positive Airway Pressure Ventilation , former smoker,    Pulmonary exam normal breath sounds clear to auscultation       Cardiovascular hypertension, + CAD, + Past MI, + CABG, + Peripheral Vascular Disease and +CHF  Normal cardiovascular exam+ pacemaker + Cardiac Defibrillator  Rhythm:Regular Rate:Normal  BiVICD  ECHO: 1. Left ventricular ejection fraction, by visual estimation, is 30 to 35%. The left ventricle has severely decreased function. Severely increased left ventricular size. There is no left ventricular hypertrophy.  2. Definity contrast agent was given IV to delineate the left ventricular endocardial borders.  3. Global hypokinesis, though function more preserved at base than apex. LV contrast excludes apical thrombus.  4. Global right ventricle has normal systolic function.The right ventricular size is normal. No increase in right ventricular wall thickness.  5. Left atrial size was normal.  6. Right atrial size was normal.  7. The mitral valve has been repaired/replaced. Mild mitral valve regurgitation. Moderate mitral stenosis.  8. The tricuspid valve is normal in structure. Tricuspid valve regurgitation mild-moderate.  9. 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 10. The aortic valve is normal in structure. Aortic valve regurgitation is trivial by color flow Doppler. Structurally normal aortic valve, with no evidence of sclerosis or stenosis. 11. Aortic valve with trivial perivalvular leak. 12. The pulmonic valve was grossly normal. Pulmonic valve regurgitation is not visualized by color flow  Doppler. 13. S/P [place]. 14. Moderately elevated pulmonary artery systolic pressure. 15. A pacer wire is visualized in the RV and RA. 16. The inferior vena cava is dilated in size with <50% respiratory variability, suggesting right atrial pressure of 15 mmHg. 17. History of mechanical aortic and mitral valves. Leaflets not well visualized, could consider fluroscopy of leaflets, but gradients not suggestive of obstruction.   Neuro/Psych Anxiety TIA Neuromuscular disease CVA    GI/Hepatic Neg liver ROS, hiatal hernia,   Endo/Other  negative endocrine ROSdiabetes  Renal/GU Dialysis and ARFRenal diseaseHyponatremia HYPERkalemia      Musculoskeletal negative musculoskeletal ROS (+)   Abdominal (+) + obese,   Peds  Hematology  (+) anemia , HLD   Anesthesia Other Findings Right carotid central line placement  Reproductive/Obstetrics                           Anesthesia Physical Anesthesia Plan  ASA: IV  Anesthesia Plan: General   Post-op Pain Management:    Induction: Intravenous  PONV Risk Score and Plan: 2 and Ondansetron, Dexamethasone and Treatment may vary due to age or medical condition  Airway Management Planned: Oral ETT  Additional Equipment: Arterial line  Intra-op Plan:   Post-operative Plan: Possible Post-op intubation/ventilation  Informed Consent: I have reviewed the patients History and Physical, chart, labs and discussed the procedure including the risks, benefits and alternatives for the proposed anesthesia with the patient or authorized representative who has indicated his/her understanding and acceptance.     Dental advisory given  Plan Discussed with: CRNA and Surgeon  Anesthesia Plan Comments:       Anesthesia Quick Evaluation

## 2019-08-19 NOTE — Consult Note (Signed)
Hospital Consult    Reason for Consult: Right carotid central line placement Referring Physician: Dr. Oletta Darter MRN #:  [id number]  History of Present Illness: This is a 58 y.o. male has a history of a Bentall procedure with mitral valve, atrial fibrillation coronary artery disease with chronic systolic and diastolic heart failure.  He presented yesterday weakness fatigue hypotension was found to be severe left bundle branch block also found to be in acute renal failure with hyperkalemia.  Plan was for dialysis.  Right internal jugular catheter was attempted unfortunately was found to cross the midline on x-ray.  When hooked to arterial line this had an arterial waveform ABG confirmed arterial flow.  Subsequently had left IJ catheter placement.  He has undergone dialysis.  INR was found to be 5 was given 4 units FFP overnight.  Chief complaint this morning is shortness of breath.  Past Medical History:  Diagnosis Date  . Anemia   . Anxiety   . ARDS (adult respiratory distress syndrome) ([place]) 01/27/2015  . Arthritis   . Ascending aortic dissection ([place]) 07/14/2008   Localized dissection of ascending aorta noted on CTA in 2009 and stable on CTA in 2011  . Atrial fibrillation (H[facility])    chronic persistent  . Bell's palsy   . C. difficile diarrhea   . CAD (coronary artery disease)    Old scar inferior wall myoview, 10/2009 EF 52%.  He did have previous SVG to RCA but no obstructive disease noted on his most recent cath.  SVG occluded.   . Cellulitis 04/02/2014  . Cerebral artery occlusion with cerebral infarction ([place]) 10/08/2011   Overview:  Overview:  And hx of TIA prior to CABG, all thought due to systemic emboli prior to coumadin   . Chronic diastolic heart failure ([place]) 03/01/2015   a. 12/2015: echo showing a preserved EF of 65-70%, moderate AS, and moderate TR.   Marland Kitchen CVA (cerebral vascular accident) ([place]) 10/08/2011   And hx of TIA prior to CABG, all thought due to systemic emboli prior to  coumadin   . DIABETES MELLITUS, TYPE II 11/01/2009   Qualifier: Diagnosis of  By: [name] MD, [name]    . Diverticulosis   . 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 Gastric polyps   . GERD (gastroesophageal reflux disease)    not needing medication at thhis time- 01/22/15  . Gout   . Hiatal hernia   . History of colon polyps 12/23/2011   Overview:  Overview:  Colonoscopy January 2010, 6 mm rectal tubulovillous adenoma. No high-grade dysplasia   . Hyperlipidemia   . Hypertension   . Hypogonadism male 04/08/2012  . Impaired glucose tolerance 10/08/2011  . Morbid obesity ([place]) 04/02/2014  . OSA (obstructive sleep apnea)    CPAP  . Peripheral vascular disease ([place])   . 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 [id number]   33 mm St Jude bileaflet mechanical valve placed via right mini thoracotomy approach  . 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 Testicular cancer [facility])    He was 58 y/o. He had surgical resection and rad tx's.   Marland Kitchen TIA (transient ischemic attack)    age 30  . Tubulovillous adenoma of colon   . 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 Venous (peripheral) insufficiency 02/03/2005    Past Surgical History:  Procedure Laterality Date  . aortic truck    . BENTALL PROCEDURE  1988   25 mm St Jude mechanical valve conduit - Dr Blase Mess at [facility] in [place], [place]  . BIOPSY  06/01/2018   Procedure: BIOPSY;  Surgeon: Jerene Bears, MD;  Location: [facility];  Service: Gastroenterology;;  . BIV ICD INSERTION CRT-D N/A 06/27/2019   Procedure: BIV ICD INSERTION CRT-D;  Surgeon: Evans Lance, MD;  Location: [facility] CV LAB;  Service: Cardiovascular;  Laterality: N/A;  .  BUBBLE STUDY  04/04/2019   Procedure: BUBBLE STUDY;  Surgeon: Larey Dresser, MD;  Location: [facility] ENDOSCOPY;  Service: Cardiovascular;;  . CARDIAC VALVE REPLACEMENT     [phone]  . CHOLECYSTECTOMY  2011  . COLONOSCOPY WITH PROPOFOL N/A 06/17/2016   Procedure: COLONOSCOPY WITH PROPOFOL;  Surgeon: Jerene Bears, MD;  Location: [facility] ENDOSCOPY;  Service: Gastroenterology;  Laterality: N/A;  . COLONOSCOPY WITH PROPOFOL N/A 06/01/2018   Procedure: COLONOSCOPY WITH PROPOFOL;  Surgeon: Jerene Bears, MD;  Location: [facility] ENDOSCOPY;  Service: Gastroenterology;  Laterality: N/A;  . CORONARY ARTERY BYPASS GRAFT    . LASER ABLATION Left 03/06/2010   leg  . MITRAL VALVE REPLACEMENT Right 01/24/2015   Procedure: Re-Operation, MINIMALLY INVASIVE MITRAL VALVE (MV) REPLACEMENT.;  Surgeon: Rexene Alberts, MD;  Location: [facility];  Service: Open Heart Surgery;  Laterality: Right;  . ORCHIECTOMY  age 66   testicular cancer  . OTOPLASTY     bilateral, age 40  . POLYPECTOMY  06/01/2018   Procedure: POLYPECTOMY;  Surgeon: Jerene Bears, MD;  Location: [facility];  Service: Gastroenterology;;  . RIGHT HEART CATH N/A 04/07/2019   Procedure: RIGHT HEART CATH;  Surgeon: Larey Dresser, MD;  Location: [facility] CV LAB;  Service: Cardiovascular;  Laterality: N/A;  . TEE WITHOUT CARDIOVERSION N/A 10/11/2014   Procedure: TRANSESOPHAGEAL ECHOCARDIOGRAM (TEE);  Surgeon: Pixie Casino, MD;  Location: [facility] ENDOSCOPY;  Service: Cardiovascular;  Laterality: N/A;  . TEE WITHOUT CARDIOVERSION N/A 01/24/2015   Procedure: TRANSESOPHAGEAL ECHOCARDIOGRAM (TEE);  Surgeon: Rexene Alberts, MD;  Location: [facility];  Service: Open Heart Surgery;  Laterality: N/A;  . TEE WITHOUT CARDIOVERSION N/A 04/04/2019   Procedure: TRANSESOPHAGEAL ECHOCARDIOGRAM (TEE);  Surgeon: Larey Dresser, MD;  Location: [facility] ENDOSCOPY;  Service: Cardiovascular;  Laterality: N/A;  . TONSILLECTOMY  1967  . TOOTH EXTRACTION  2016    Allergies  Allergen Reactions  .  Feraheme [Ferumoxytol] Shortness Of Breath    SOB/Tachycardia  . Penicillins Hives and Rash    Has patient had a PCN reaction causing immediate rash, facial/tongue/throat swelling, SOB or lightheadedness with hypotension: Yes Has patient had a PCN reaction causing severe rash involving mucus membranes or skin necrosis: Yes Has patient had a PCN reaction that required hospitalization unsure Has patient had a PCN reaction occurring within the last 10 years: 2010-2012 If all of the above answers are "NO", then may proceed with Cephalosporin use.  [name] Other (See Comments)    CDIFF, when taking oral tablets   . Tape Other (See Comments) and Itching    Skin irritation    Prior to Admission medications   Medication Sig Start Date End Date Taking? Authorizing Provider  allopurinol (ZYLOPRIM) 300 MG tablet Take 300 mg by mouth daily. 11/17/12  Yes Biagio Borg, MD  ALPRAZolam Duanne Moron) 1 MG tablet  Take 1 mg by mouth 3 (three) times daily. 07/07/19  Yes [provider]  amiodarone (PACERONE) 200 MG tablet Take 1 tablet (200 mg total) by mouth 2 (two) times daily. Take 1 tablet 2 times a day for 1 week. Then take 1 tablet once daily. Patient taking differently: Take 200 mg by mouth daily.  07/01/19  Yes Lyda Jester M, PA-C  aspirin EC 81 MG EC tablet Take 1 tablet (81 mg total) by mouth daily. 02/27/19  Yes [name], MD  bumetanide (BUMEX) 2 MG tablet Take 2 mg by mouth 5 (five) times daily as needed. 08/14/2019  Yes [provider]  digoxin (LANOXIN) 0.125 MG tablet Take 1 tablet (0.125 mg total) by mouth daily. 07/01/19  Yes Lyda Jester M, PA-C  gabapentin (NEURONTIN) 300 MG capsule Take 300 mg by mouth 3 (three) times daily.   Yes [provider]  magnesium oxide (MAG-OX) 400 MG tablet Take 400 mg by mouth 3 (three) times daily.   Yes [provider]  methocarbamol (ROBAXIN) 500 MG tablet Take 1 tablet (500 mg total)  by mouth 3 (three) times daily. 07/30/18 08/09/2019 Yes Vevelyn Francois, NP  metolazone (ZAROXOLYN) 5 MG tablet Take 1 tablet by mouth every Monday, Wednesday, and Friday 08/09/19  Yes Rosita Fire, Brittainy M, PA-C  metoprolol succinate (TOPROL-XL) 25 MG 24 hr tablet Take 0.5 tablets (12.5 mg total) by mouth daily. 08/09/19  Yes Lyda Jester M, PA-C  midodrine (PROAMATINE) 10 MG tablet Take 1 tablet (10 mg total) by mouth 3 (three) times daily with meals. 07/01/19  Yes Lyda Jester M, PA-C  mupirocin ointment (BACTROBAN) 2 % Apply 1 application topically 3 (three) times daily.    Yes [provider]  potassium chloride SA (KLOR-CON) 20 MEQ tablet Take 3 tablets (60 mEq total) by mouth 2 (two) times daily. 07/01/19  Yes Consuelo Pandy, PA-C  PRESCRIPTION MEDICATION See admin instructions. CPAP- At bedtime and during any time of rest   Yes [provider]  silver sulfADIAZINE (SILVADENE) 1 % cream Apply 1 application topically daily as needed (for wound care- both legs).    Yes [provider]  spironolactone (ALDACTONE) 25 MG tablet Take 1 tablet (25 mg total) by mouth daily. 04/11/19  Yes Clegg, Amy D, NP  torsemide (DEMADEX) 20 MG tablet Take 5 tablets (100 mg total) by mouth 2 (two) times daily. 07/20/19  Yes Bensimhon, Shaune Pascal, MD  traZODone (DESYREL) 100 MG tablet Take 100 mg by mouth at bedtime.  01/24/19  Yes [provider]  warfarin (COUMADIN) 5 MG tablet Take 1 tablet (5 mg total) by mouth daily at 6 PM. 07/01/19  Yes Rosita Fire, Brittainy M, PA-C  ALPRAZolam Duanne Moron) 0.25 MG tablet Take 1 tablet (0.25 mg total) by mouth 2 (two) times daily as needed for anxiety. Patient not taking: Reported on 08/20/2019 07/01/19   Lyda Jester M, PA-C  oxyCODONE (OXY IR/ROXICODONE) 5 MG immediate release tablet Take 1 tablet (5 mg total) by mouth every 8 (eight) hours as needed for severe pain. Patient not taking: Reported on 08/03/2019 09/28/18 08/23/2019  Vevelyn Francois, NP    Social History   Socioeconomic History  . Marital status: Married    Spouse name: Not on file  . Number of children: 0  . Years of education: Not on file  . Highest education level: Not on file  Occupational History  . Occupation: Disabled Environmental education officer: UNEMPLOYED  Tobacco Use  .  Smoking status: Former Smoker    Types: Cigars  . Smokeless tobacco: Never Used  . Tobacco comment: about 3 yearly- cigar  Substance and Sexual Activity  . Alcohol use: Yes    Alcohol/week: 0.0 standard drinks    Comment: social  . Drug use: No  . Sexual activity: Not on file  Other Topics Concern  . Not on file  Social History Narrative  . Not on file   Social Determinants of Health   Financial Resource Strain:   . Difficulty of Paying Living Expenses: Not on file  Food Insecurity:   . Worried About Charity fundraiser in the Last Year: Not on file  . Ran Out of Food in the Last Year: Not on file  Transportation Needs:   . Lack of Transportation (Medical): Not on file  . Lack of Transportation (Non-Medical): Not on file  Physical Activity:   . Days of Exercise per Week: Not on file  . Minutes of Exercise per Session: Not on file  Stress:   . Feeling of Stress : Not on file  Social Connections:   . Frequency of Communication with Friends and Family: Not on file  . Frequency of Social Gatherings with Friends and Family: Not on file  . Attends Religious Services: Not on file  . Active Member of Clubs or Organizations: Not on file  . Attends Archivist Meetings: Not on file  . Marital Status: Not on file  Intimate Partner Violence:   . Fear of Current or Ex-Partner: Not on file  . Emotionally Abused: Not on file  . Physically Abused: Not on file  . Sexually Abused: Not on file     Family History  Problem Relation Age of Onset  . Heart disease Mother   . Throat cancer Mother   . Other Mother        bowel obstruction  . Diabetes Father   .  Stroke Other   . Hypertension Other   . Cancer Maternal Aunt        lung, brain  . Cancer Maternal Uncle        brain aneurysm  . Colon cancer Neg Hx     ROS: Shortness of breath  Physical Examination  Vitals:   09/01/2019 0715 08/05/2019 0730  BP: (!) 79/35 (!) 82/40  Pulse: 75 71  Resp: (!) 33 (!) 23  Temp:  98.2 F (36.8 C)  SpO2: 98% 98%   Body mass index is 36.6 kg/m.  He is awake alert and oriented Mild labored respirations Abdomen is soft Bilateral lower extremity swelling feet are warm and well-perfused Right neck line appears to be several centimeters above the clavicle at cannulation site  CBC    Component Value Date/Time   WBC 25.3 (H) 08/18/2019 0048   RBC 5.22 08/29/2019 0048   HGB 11.0 (L) 08/29/2019 0048   HGB 10.3 (L) 01/20/2019 1252   HGB 13.2 02/03/2012 1144   HCT 36.8 (L) 08/29/2019 0048   HCT 35.5 (L) 01/20/2019 1252   HCT 41.5 02/03/2012 1144   PLT 221 08/03/2019 0048   PLT 229 01/20/2019 1252   MCV 70.5 (L) 08/15/2019 0048   MCV 71 (L) 01/20/2019 1252   MCV 87 03/02/2013 1132   MCV 79.7 02/03/2012 1144   MCH 21.1 (L) 08/18/2019 0048   MCHC 29.9 (L) 08/20/2019 0048   RDW 20.5 (H) 09/01/2019 0048   RDW 18.2 (H) 01/20/2019 1252   RDW 17.0 (H) 03/02/2013 1132  RDW 23.4 (H) 02/03/2012 1144   LYMPHSABS 0.3 (L) 08/25/2019 1424   LYMPHSABS 1.7 09/22/2012 0413   LYMPHSABS 1.0 02/03/2012 1144   MONOABS 1.2 (H) 08/03/2019 1424   MONOABS 0.9 09/22/2012 0413   MONOABS 0.5 02/03/2012 1144   EOSABS 0.1 08/07/2019 1424   EOSABS 0.2 09/22/2012 0413   BASOSABS 0.1 08/08/2019 1424   BASOSABS 0.1 09/22/2012 0413   BASOSABS 0.1 02/03/2012 1144    BMET    Component Value Date/Time   NA 125 (L) 08/16/2019 0556   NA 133 (L) 02/04/2019 1647   NA 138 12/09/2013 1606   K 5.7 (H) 08/05/2019 0556   K 4.3 12/09/2013 1606   CL 86 (L) 08/12/2019 0401   CL 104 12/09/2013 1606   CO2 29 08/09/2019 0401   CO2 30 12/09/2013 1606   GLUCOSE 110 (H)  08/30/2019 0401   GLUCOSE 85 12/09/2013 1606   BUN 56 (H) 08/06/2019 0401   BUN 31 (H) 02/04/2019 1647   BUN 22 (H) 12/09/2013 1606   CREATININE 1.99 (H) 08/03/2019 0401   CREATININE 0.94 01/16/2017 0903   CALCIUM 8.3 (L) 08/07/2019 0401   CALCIUM 9.2 12/09/2013 1606   GFRNONAA 36 (L) 08/08/2019 0401   GFRNONAA >60 12/09/2013 1606   GFRAA 42 (L) 08/11/2019 0401   GFRAA >60 12/09/2013 1606    COAGS: Lab Results  Component Value Date   INR 2.4 (H) 08/09/2019   INR 5.3 (HH) 08/08/2019   INR 2.1 08/16/2019       ASSESSMENT/PLAN: This is a 58 y.o. male presented critically ill with urgent need for dialysis and central line unfortunate placed in right carotid artery.  Will need to have this removed operatively.  I have ordered 2 units of FFP to be given this morning.  He is on the schedule for later this afternoon.  Please keep patient n.p.o.  Erlene Quan C. Donzetta Matters, MD [facility] Office: [phone] Pager: [phone]

## 2019-08-19 NOTE — Consult Note (Addendum)
Advanced Heart Failure Team Consult Note   Primary Physician: [name], MD PCP-Cardiologist:  [name], MD  HF MD: Dr [name]   Reason for Consultation: Heart Failure   HPI:    Lance Cook is seen today for evaluation of heart failure  at the request of Dr Lamonte Sakai.   Lance Cook a58 .o.with history of Bentall procedure with St Jude mechanical aortic alve and conduit in 1988. 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 This procedure was complicated by ARDS and prolonged time on the vent. He had SVG-RCA with original surgery. Last cath in 4/16 prior to MV surgery showed patent native coronaries, SVG-RCA likely occluded (never identified). He has had chronic atrial fibrillation and has history of CVA.   Initially after MV surgery, he did fairly well. 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 He had reasonable quality of life on po Lasix with occasional episodes of fluid retention that were manageable. However, starting in the Spring of 2020, he began to decompensate. He was admitted in 6/20 and was diuresed with IV Lasix, he lost 20 lbs. However, after discharge, po diuretics were not effective at managing the re-development of volume overload. He was re-admitted 04/01/19 with marked volume overload. Diuresed with lasix drip and later transitioned to torsemide 60 mg twice a day. Hospital course complicated by severe allergic reaction to feraheme. He was placed on bipap, solumedrol, Neo and given midodrine. Discharge weight was 165.5 pounds. While in the hospital, he had a TEE showing markedly dilated LA, LV with EF 50%, septal-lateral dyssynchrony, normal RV systolic function/size, mechanical MV with mean gradient 5 and mild peri-valvular regurgitation, mechanical AoV with mean gradient 23, appeared to open well.   After discharge, he developed progressive volume overload requiring outpatient diuretic dose  adjustments. Torsemide was increased to 80 bid and metolazone 5 mg 2 days a week (Mon + Fri). Also on spironolactone 25 mg daily.   On 10/20, he presented to the [facility] ED w/ complaints of increased dyspnea and progressive 25 lb wt gain.Started on IV diuretics for a/c CHF.  Morning of 10/21, pt went into WCT in the 200s requiring defibrillation. ? afib w/ aberancy vs VT. Started on IV amiodarone.EP consulted.Confirmed to be VT. Rate 200s, monomorphic. No LOC. Did not require CPR.Cardioversion x 3 failed, he converted back to atrial fibrillation after amiodarone boluses. He had no recurrent VT.Echo after VT showed reduced LVEF, 30-35% but no chest pain and HS-Tn not significantly elevated,making ACS/coronary ischemia unlikely.PYP scan showed no evidence for ATTR amyloidosis.He underwent placement of a [employer] CRT-D deviceon 10/26 and was continued on amiodarone. Unfortunately, he had diaphragmatic stimulation, LV lead turned off.Per Dr. [name], will hold off onrevision for now and in the future could consider Left bundle pacing if needed.  On 12/15//20 he was seen in the HF clinic and volume overloaded and had 10 pound weight gain. He was asked to take an extra metolazone and continue his home diuretic regimen.   Yesterday presented to [facility] ED with fatigue, weakness, and hypotension. He was hyperkalemic with K . 8 and in  new acute renal failure with creatinine up to 2.7. Admitted to ICU for dialysis. HD cath placed but malpositioned so vascular consulted to repair. Started on urgent dialysis. Norepi stopped today and started dobutamine 2.5 mcg.   Review of Systems: [y] = yes, [ ]  = no   . General: Weight gain [ ] ;  Weight loss [ ] ; Anorexia [ ] ; Fatigue [ Y]; Fever [ ] ; Chills [ ] ; Weakness [ ]   . Cardiac: Chest pain/pressure [ ] ; Resting SOB [ ] ; Exertional SOB [Y ]; Orthopnea [ ] ; Pedal Edema [Y ]; Palpitations [ ] ; Syncope [ ] ; Presyncope [ ] ; Paroxysmal nocturnal dyspnea[ ]    . Pulmonary: Cough [ ] ; Wheezing[ ] ; Hemoptysis[ ] ; Sputum [ ] ; Snoring [ ]   . GI: Vomiting[ ] ; Dysphagia[ ] ; Melena[ ] ; Hematochezia [ ] ; Heartburn[ ] ; Abdominal pain [ ] ; Constipation [ ] ; Diarrhea [ ] ; BRBPR [ ]   . GU: Hematuria[ ] ; Dysuria [ ] ; Nocturia[ ]   . Vascular: Pain in legs with walking [Y ]; Pain in feet with lying flat [ ] ; Non-healing sores [ ] ; Stroke [ ] ; TIA [ ] ; Slurred speech [ ] ;  . Neuro: Headaches[ ] ; Vertigo[ ] ; Seizures[ ] ; Paresthesias[ ] ;Blurred vision [ ] ; Diplopia [ ] ; Vision changes [ ]   . Ortho/Skin: Arthritis [ ] ; Joint pain [ Y]; Muscle pain [ ] ; Joint swelling [ ] ; Back Pain [Y ]; Rash [ ]   . Psych: Depression[ ] ; Anxiety[ ]   . Heme: Bleeding problems [ ] ; Clotting disorders [ ] ; Anemia [ ]   . Endocrine: Diabetes [Y ]; Thyroid dysfunction[ ]   Home Medications Prior to Admission medications   Medication Sig Start Date End Date Taking? Authorizing Provider  allopurinol (ZYLOPRIM) 300 MG tablet Take 300 mg by mouth daily. 11/17/12  Yes Biagio Borg, MD  ALPRAZolam Duanne Moron) 1 MG tablet Take 1 mg by mouth 3 (three) times daily. 07/07/19  Yes [provider]  amiodarone (PACERONE) 200 MG tablet Take 1 tablet (200 mg total) by mouth 2 (two) times daily. Take 1 tablet 2 times a day for 1 week. Then take 1 tablet once daily. Patient taking differently: Take 200 mg by mouth daily.  07/01/19  Yes Lyda Jester M, PA-C  aspirin EC 81 MG EC tablet Take 1 tablet (81 mg total) by mouth daily. 02/27/19  Yes [name], MD  bumetanide (BUMEX) 2 MG tablet Take 2 mg by mouth 5 (five) times daily as needed. 08/29/2019  Yes [provider]  digoxin (LANOXIN) 0.125 MG tablet Take 1 tablet (0.125 mg total) by mouth daily. 07/01/19  Yes Lyda Jester M, PA-C  gabapentin (NEURONTIN) 300 MG capsule Take 300 mg by mouth 3 (three) times daily.   Yes [provider]  magnesium oxide (MAG-OX) 400 MG tablet Take 400 mg by mouth 3 (three) times daily.    Yes [provider]  methocarbamol (ROBAXIN) 500 MG tablet Take 1 tablet (500 mg total) by mouth 3 (three) times daily. 07/30/18 08/10/2019 Yes Vevelyn Francois, NP  metolazone (ZAROXOLYN) 5 MG tablet Take 1 tablet by mouth every Monday, Wednesday, and Friday 08/09/19  Yes Rosita Fire, Brittainy M, PA-C  metoprolol succinate (TOPROL-XL) 25 MG 24 hr tablet Take 0.5 tablets (12.5 mg total) by mouth daily. 08/09/19  Yes Lyda Jester M, PA-C  midodrine (PROAMATINE) 10 MG tablet Take 1 tablet (10 mg total) by mouth 3 (three) times daily with meals. 07/01/19  Yes Lyda Jester M, PA-C  mupirocin ointment (BACTROBAN) 2 % Apply 1 application topically 3 (three) times daily.    Yes [provider]  potassium chloride SA (KLOR-CON) 20 MEQ tablet Take 3 tablets (60 mEq total) by mouth 2 (two) times daily. 07/01/19  Yes Consuelo Pandy, PA-C  PRESCRIPTION MEDICATION See admin instructions. CPAP- At bedtime and during any time of rest  Yes [provider]  silver sulfADIAZINE (SILVADENE) 1 % cream Apply 1 application topically daily as needed (for wound care- both legs).    Yes [provider]  spironolactone (ALDACTONE) 25 MG tablet Take 1 tablet (25 mg total) by mouth daily. 04/11/19  Yes Clegg, Amy D, NP  torsemide (DEMADEX) 20 MG tablet Take 5 tablets (100 mg total) by mouth 2 (two) times daily. 07/20/19  Yes Bensimhon, Shaune Pascal, MD  traZODone (DESYREL) 100 MG tablet Take 100 mg by mouth at bedtime.  01/24/19  Yes [provider]  warfarin (COUMADIN) 5 MG tablet Take 1 tablet (5 mg total) by mouth daily at 6 PM. 07/01/19  Yes Rosita Fire, Brittainy M, PA-C  ALPRAZolam Duanne Moron) 0.25 MG tablet Take 1 tablet (0.25 mg total) by mouth 2 (two) times daily as needed for anxiety. Patient not taking: Reported on 08/17/2019 07/01/19   Lyda Jester M, PA-C  oxyCODONE (OXY IR/ROXICODONE) 5 MG immediate release tablet Take 1 tablet (5 mg total) by mouth every 8 (eight)  hours as needed for severe pain. Patient not taking: Reported on 08/20/2019 09/28/18 08/31/2019  Vevelyn Francois, NP    Past Medical History: Past Medical History:  Diagnosis Date  . Anemia   . Anxiety   . ARDS (adult respiratory distress syndrome) ([place]) 01/27/2015  . Arthritis   . Ascending aortic dissection ([place]) 07/14/2008   Localized dissection of ascending aorta noted on CTA in 2009 and stable on CTA in 2011  . Atrial fibrillation (H[facility])    chronic persistent  . Bell's palsy   . C. difficile diarrhea   . CAD (coronary artery disease)    Old scar inferior wall myoview, 10/2009 EF 52%.  He did have previous SVG to RCA but no obstructive disease noted on his most recent cath.  SVG occluded.   . Cellulitis 04/02/2014  . Cerebral artery occlusion with cerebral infarction ([place]) 10/08/2011   Overview:  Overview:  And hx of TIA prior to CABG, all thought due to systemic emboli prior to coumadin   . Chronic diastolic heart failure ([place]) 03/01/2015   a. 12/2015: echo showing a preserved EF of 65-70%, moderate AS, and moderate TR.   Marland Kitchen CVA (cerebral vascular accident) ([place]) 10/08/2011   And hx of TIA prior to CABG, all thought due to systemic emboli prior to coumadin   . DIABETES MELLITUS, TYPE II 11/01/2009   Qualifier: Diagnosis of  By: [name] MD, [name]    . Diverticulosis   . 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 Gastric polyps   . GERD (gastroesophageal reflux disease)    not needing medication at thhis time- 01/22/15  . Gout   . Hiatal hernia   . History of colon polyps 12/23/2011   Overview:  Overview:  Colonoscopy January 2010, 6 mm rectal tubulovillous adenoma. No high-grade dysplasia   . Hyperlipidemia   . Hypertension   . Hypogonadism male 04/08/2012  . Impaired glucose tolerance 10/08/2011  . Morbid obesity ([place]) 04/02/2014  . OSA (obstructive sleep  apnea)    CPAP  . Peripheral vascular disease ([place])   . 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 [id number]   33 mm St Jude bileaflet mechanical valve placed via right mini thoracotomy approach  . 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    1988 -  Dr Blase Mess at [facility] in [place], [place]  . Testicular cancer [facility])    He was 58 y/o. He had surgical resection and rad tx's.   Marland Kitchen TIA (transient ischemic attack)    age 59  . Tubulovillous adenoma of colon   . 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 Venous (peripheral) insufficiency 02/03/2005    Past Surgical History: Past Surgical History:  Procedure Laterality Date  . aortic truck    . BENTALL PROCEDURE  1988   25 mm St Jude mechanical valve conduit - Dr Blase Mess at [facility] in [place], [place]  . BIOPSY  06/01/2018   Procedure: BIOPSY;  Surgeon: Jerene Bears, MD;  Location[facility];  Service: Gastroenterology;;  . BIV ICD INSERTION CRT-D N/A 06/27/2019   Procedure: BIV ICD INSERTION CRT-D;  Surgeon: Evans Lance, MD;  Location: [facility] CV LAB;  Service: Cardiovascular;  Laterality: N/A;  . BUBBLE STUDY  04/04/2019   Procedure: BUBBLE STUDY;  Surgeon: Larey Dresser, MD;  Location: [facility] ENDOSCOPY;  Service: Cardiovascular;;  . CARDIAC VALVE REPLACEMENT     [phone]  . CHOLECYSTECTOMY  2011  . COLONOSCOPY WITH PROPOFOL N/A 06/17/2016   Procedure: COLONOSCOPY WITH PROPOFOL;  Surgeon: Jerene Bears, MD;  Location: [facility] ENDOSCOPY;  Service: Gastroenterology;  Laterality: N/A;  . COLONOSCOPY WITH PROPOFOL N/A 06/01/2018   Procedure: COLONOSCOPY WITH PROPOFOL;  Surgeon: Jerene Bears, MD;  Location: [facility] ENDOSCOPY;  Service: Gastroenterology;  Laterality: N/A;  . CORONARY ARTERY BYPASS GRAFT    . LASER ABLATION Left 03/06/2010   leg  . MITRAL VALVE REPLACEMENT Right 01/24/2015   Procedure: Re-Operation, MINIMALLY INVASIVE  MITRAL VALVE (MV) REPLACEMENT.;  Surgeon: Rexene Alberts, MD;  Location: [place];  Service: Open Heart Surgery;  Laterality: Right;  . ORCHIECTOMY  age 66   testicular cancer  . OTOPLASTY     bilateral, age 56  . POLYPECTOMY  06/01/2018   Procedure: POLYPECTOMY;  Surgeon: Jerene Bears, MD;  Location: [facility];  Service: Gastroenterology;;  . RIGHT HEART CATH N/A 04/07/2019   Procedure: RIGHT HEART CATH;  Surgeon: Larey Dresser, MD;  Location: [place] CV LAB;  Service: Cardiovascular;  Laterality: N/A;  . TEE WITHOUT CARDIOVERSION N/A 10/11/2014   Procedure: TRANSESOPHAGEAL ECHOCARDIOGRAM (TEE);  Surgeon: Pixie Casino, MD;  Location: [facility] ENDOSCOPY;  Service: Cardiovascular;  Laterality: N/A;  . TEE WITHOUT CARDIOVERSION N/A 01/24/2015   Procedure: TRANSESOPHAGEAL ECHOCARDIOGRAM (TEE);  Surgeon: Rexene Alberts, MD;  Location: [place];  Service: Open Heart Surgery;  Laterality: N/A;  . TEE WITHOUT CARDIOVERSION N/A 04/04/2019   Procedure: TRANSESOPHAGEAL ECHOCARDIOGRAM (TEE);  Surgeon: Larey Dresser, MD;  Location: [facility] ENDOSCOPY;  Service: Cardiovascular;  Laterality: N/A;  . TONSILLECTOMY  1967  . TOOTH EXTRACTION  2016    Family History: Family History  Problem Relation Age of Onset  . Heart disease Mother   . Throat cancer Mother   . Other Mother        bowel obstruction  . Diabetes Father   . Stroke Other   . Hypertension Other   . Cancer Maternal Aunt        lung, brain  . Cancer Maternal Uncle        brain aneurysm  . Colon cancer Neg Hx     Social History: Social History   Socioeconomic History  . Marital status: Married    Spouse name: Not on file  . Number  of children: 0  . Years of education: Not on file  . Highest education level: Not on file  Occupational History  . Occupation: Disabled Environmental education officer: UNEMPLOYED  Tobacco Use  . Smoking status: Former Smoker    Types: Cigars  . Smokeless tobacco: Never Used  . Tobacco comment: about 3 yearly-  cigar  Substance and Sexual Activity  . Alcohol use: Yes    Alcohol/week: 0.0 standard drinks    Comment: social  . Drug use: No  . Sexual activity: Not on file  Other Topics Concern  . Not on file  Social History Narrative  . Not on file   Social Determinants of Health   Financial Resource Strain:   . Difficulty of Paying Living Expenses: Not on file  Food Insecurity:   . Worried About Charity fundraiser in the Last Year: Not on file  . Ran Out of Food in the Last Year: Not on file  Transportation Needs:   . Lack of Transportation (Medical): Not on file  . Lack of Transportation (Non-Medical): Not on file  Physical Activity:   . Days of Exercise per Week: Not on file  . Minutes of Exercise per Session: Not on file  Stress:   . Feeling of Stress : Not on file  Social Connections:   . Frequency of Communication with Friends and Family: Not on file  . Frequency of Social Gatherings with Friends and Family: Not on file  . Attends Religious Services: Not on file  . Active Member of Clubs or Organizations: Not on file  . Attends Archivist Meetings: Not on file  . Marital Status: Not on file    Allergies:  Allergies  Allergen Reactions  . Feraheme [Ferumoxytol] Shortness Of Breath    SOB/Tachycardia  . Penicillins Hives and Rash    Has patient had a PCN reaction causing immediate rash, facial/tongue/throat swelling, SOB or lightheadedness with hypotension: Yes Has patient had a PCN reaction causing severe rash involving mucus membranes or skin necrosis: Yes Has patient had a PCN reaction that required hospitalization unsure Has patient had a PCN reaction occurring within the last 10 years: 2010-2012 If all of the above answers are "NO", then may proceed with Cephalosporin use.  [name] Other (See Comments)    CDIFF, when taking oral tablets   . Tape Other (See Comments) and Itching    Skin irritation    Objective:    Vital  Signs:   Temp:  [97.5 F (36.4 C)-99 F (37.2 C)] 98.1 F (36.7 C) (12/18 1510) Pulse Rate:  [48-127] 76 (12/18 1510) Resp:  [15-33] 21 (12/18 1510) BP: (79-142)/(35-105) 113/54 (12/18 1149) SpO2:  [89 %-100 %] 100 % (12/18 1510) FiO2 (%):  [100 %] 100 % (12/18 1510) Weight:  [85 kg] 85 kg (12/18 0500)    Weight change: Filed Weights   08/07/2019 1400 09/01/2019 0500  Weight: 80.3 kg 85 kg    Intake/Output:   Intake/Output Summary (Last 24 hours) at 08/29/2019 1703 Last data filed at 08/22/2019 1433 Gross per 24 hour  Intake 3632.31 ml  Output 2375 ml  Net 1257.31 ml      Physical Exam    General:  Well appearing. No resp difficulty HEENT: normal Neck: supple. JVP . Carotids 2+ bilat; no bruits. No lymphadenopathy or thyromegaly appreciated. Cor: PMI nondisplaced. Regular rate & rhythm. No rubs, gallops or murmurs. Lungs: clear Abdomen: soft, nontender, nondistended. No hepatosplenomegaly. No bruits or masses.  Good bowel sounds. Extremities: no cyanosis, clubbing, rash, edema Neuro: alert & orientedx3, cranial nerves grossly intact. moves all 4 extremities w/o difficulty. Affect pleasant   Telemetry    EKG      Labs   Basic Metabolic Panel: Recent Labs  Lab 08/30/2019 1424 08/16/2019 1424 08/27/2019 1624 08/28/2019 0048 08/17/2019 0401 08/25/2019 0556 08/22/2019 0821 08/11/2019 0914 08/11/2019 1233 08/17/2019 1556  NA 112*  --  119* 123* 125* 125* 126* 125* 123* 127*  K 8.4*  --  >7.5* 6.9* 5.6* 5.7* 5.9* 5.6* 5.5* 5.0  CL 81*  --  85* 87* 86*  --   --   --   --  91*  CO2  --   --  22 25 29   --   --   --   --  27  GLUCOSE 92  --  81 119* 110*  --   --   --   --  131*  BUN 98*  --  71* 61* 56*  --   --   --   --  50*  CREATININE 2.70*  --  2.54* 2.05* 1.99*  --   --   --   --  1.65*  CALCIUM  --    < > 7.8* 8.3* 8.3*  --   --   --   --  8.8*  MG  --   --  2.7* 2.6* 2.5*  --   --   --   --   --   PHOS  --   --  5.8*  --   --   --   --   --   --  4.9*   < > = values in  this interval not displayed.    Liver Function Tests: Recent Labs  Lab 08/25/2019 0048 08/26/2019 0401 08/12/2019 1556  AST 34 25  --   ALT 16 15  --   ALKPHOS 54 59  --   BILITOT 0.5 1.0  --   PROT 5.3* 5.4*  --   ALBUMIN 2.3* 2.4* 2.3*   No results for input(s): LIPASE, AMYLASE in the last 168 hours. No results for input(s): AMMONIA in the last 168 hours.  CBC: Recent Labs  Lab 08/16/2019 1424 08/05/2019 1639 08/28/2019 0048 08/13/2019 1233  WBC 19.1*  --  25.3*  --   NEUTROABS 17.0*  --   --   --   HGB 9.6*  11.2* 10.2* 11.0* 10.2*  HCT 32.7*  33.0* 30.0* 36.8* 30.0*  MCV 71.1*  --  70.5*  --   PLT 233  --  221  --     Cardiac Enzymes: No results for input(s): CKTOTAL, CKMB, CKMBINDEX, TROPONINI in the last 168 hours.  BNP: BNP (last 3 results) Recent Labs    07/19/19 0956 08/02/19 0957 08/12/2019 1424  BNP 298.7* 167.3* 106.1*    ProBNP (last 3 results) No results for input(s): PROBNP in the last 8760 hours.   CBG: Recent Labs  Lab 08/07/2019 2330 08/03/2019 0454 08/26/2019 0749 08/22/2019 1116 08/04/2019 1557  GLUCAP 106* 104* 100* 108* 110*    Coagulation Studies: Recent Labs    08/14/2019 1624 08/16/2019 0556 08/25/2019 1234  LABPROT 48.3* 25.7* 24.7*  INR 5.3* 2.4* 2.2*     Imaging   DG Chest Portable 1 View  Result Date: 08/08/2019 CLINICAL DATA:  Central line placement EXAM: PORTABLE CHEST 1 VIEW COMPARISON:  Earlier same day FINDINGS: Left internal jugular central line has its tip in the SVC  above the right atrium. No pneumothorax. Right internal jugular central line remains in place, crossing the midline towards the left. As noted previously, arterial placement not excluded. Pacemaker/AICD remains in place. Chronic cardiomegaly with mitral valve replacement. Persistent bibasilar atelectasis. IMPRESSION: New left internal jugular central line tip in the SVC above the right atrium. No pneumothorax. No other change. Electronically Signed   By: Nelson Chimes  M.D.   On: 08/05/2019 17:21      Medications:     Current Medications: . albuterol  2.5 mg Nebulization Q4H  . amiodarone  150 mg Intravenous Once  . amiodarone  200 mg Oral Daily  . Chlorhexidine Gluconate Cloth  6 each Topical Q0600  . fentaNYL      . insulin aspart  2-6 Units Subcutaneous Q4H  . methocarbamol  500 mg Oral TID  . pantoprazole  40 mg Oral Daily     Infusions: .  prismasol BGK 4/2.5    .  prismasol BGK 4/2.5    . amiodarone 60 mg/hr (08/20/2019 1700)   Followed by  . amiodarone    . amiodarone    . DOBUTamine 2.5 mcg/kg/min (08/13/2019 1616)  . fentaNYL infusion INTRAVENOUS 100 mcg/hr (08/24/2019 1614)  . heparin    . norepinephrine    . norepinephrine (LEVOPHED) Adult infusion    . prismasol BGK 4/2.5          Assessment/Plan   1. Hyperkalemia  K 8. On Dialysis.   2. AKI  On dialysis   3. A/C Diastolic HF Volume managed with HD  4. Valvular Heart Disease Mechanic mitral/aortic valve   5. VT On amio drip 30 mg per hour  6. CAD Last cath in 4/16 with patent native coronaries, probably occluded SVG-RCA  7. DMII On sliding scale   Length of Stay: 1  [name], NP  08/28/2019, 5:03 PM  Advanced Heart Failure Team Pager [phone] (M-F; 7a - 4p)  Please contact [facility] for night-coverage after hours (4p -7a ) and weekends on [email or web address]  Patient seen with NP, agree with the above note.   He was admitted with AKI, hyperkalemia, hyponatremia, and marked volume overload.  He is now in the ICU on dopamine, he had urgent HD last night.  He had an HD catheter accidentally placed in the right carotid, now has had it removed and the carotid surgically repaired by vascular. Some UOP, 875 total over the last day.  Digoxin level also high, he has had digibind.    General: NAD Neck: JVP 16 cm, no thyromegaly or thyroid nodule.  Lungs: Decreased at bases.  CV: Nondisplaced PMI.  Heart regular S1/S2 (mechanical S1S2), no S3/S4, no murmur.  2/6  SEM RUSB.  2+ edema to knees.  No carotid bruit.  Difficult to palpate pedal pulses.  Abdomen: Soft, nontender, no hepatosplenomegaly, mild distention.  Skin: Intact without lesions or rashes.  Neurologic: Alert and oriented x 3.  Psych: Normal affect. Extremities: No clubbing or cyanosis.  HEENT: Normal.   1. AKI on CKD stage 3: Creatinine up to 2.7 at admission with marked hyperkalemia and hyponatremia in setting of use of torsemide, metolazone, and spironolactone at home.  He had urgent HD last night, electrolytes improved.  He is now markedly volume overloaded with CVP > 20.  - He needs CVVH, discussed with renal and will initiate today.  2. Acute on chronic systolic CHF/cardiogenic shock: EF 30-35% on last echo.  [employer] CRT-D, LV not on due to diaphragmatic stimulation.  Hypotension in setting of AKI/hyperkalemia.  Now on dopamine with stable MAP.   Marked volume overload with CVP > 20.  Volume overload has been building up over time, has required escalating diuretic doses as outpatient. - Follow co-ox. - Would like to titrate him off dopamine and onto norepinephrine/dobutamine.  - CVVH for volume removal, starting today.  3. VT: Patient has Chemical engineer CRT-D device, LV lead off due to diaphragmatic pacing.  Patient has has runs of VT, now on amiodarone gtt.  4. Digoxin toxicity: Severely elevated digoxin level, has had digibind and HD.  5. CAD: Last cath in 2016 with patent native coronaries, probably occluded SVG-RCA.  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 Continue heparin gtt while off warfarin.  7. Atrial fibrillation: Chronic.  Continue heparin gtt.   Loralie Champagne 08/26/2019 6:08 PM

## 2019-08-20 ENCOUNTER — Inpatient Hospital Stay (HOSPITAL_COMMUNITY): Payer: Medicare HMO

## 2019-08-20 DIAGNOSIS — G9340 Encephalopathy, unspecified: Secondary | ICD-10-CM

## 2019-08-20 DIAGNOSIS — J9601 Acute respiratory failure with hypoxia: Secondary | ICD-10-CM

## 2019-08-20 LAB — POCT I-STAT 7, (LYTES, BLD GAS, ICA,H+H)
Acid-Base Excess: 4 mmol/L — ABNORMAL HIGH (ref 0.0–2.0)
Bicarbonate: 30.4 mmol/L — ABNORMAL HIGH (ref 20.0–28.0)
Calcium, Ion: 1.13 mmol/L — ABNORMAL LOW (ref 1.15–1.40)
HCT: 32 % — ABNORMAL LOW (ref 39.0–52.0)
Hemoglobin: 10.9 g/dL — ABNORMAL LOW (ref 13.0–17.0)
O2 Saturation: 97 %
Patient temperature: 97.3
Potassium: 4.8 mmol/L (ref 3.5–5.1)
Sodium: 129 mmol/L — ABNORMAL LOW (ref 135–145)
TCO2: 32 mmol/L (ref 22–32)
pCO2 arterial: 53.7 mmHg — ABNORMAL HIGH (ref 32.0–48.0)
pH, Arterial: 7.358 (ref 7.350–7.450)
pO2, Arterial: 95 mmHg (ref 83.0–108.0)

## 2019-08-20 LAB — RENAL FUNCTION PANEL
Albumin: 2.3 g/dL — ABNORMAL LOW (ref 3.5–5.0)
Albumin: 2.1 g/dL — ABNORMAL LOW (ref 3.5–5.0)
Anion gap: 9 (ref 5–15)
Anion gap: 7 (ref 5–15)
BUN: 34 mg/dL — ABNORMAL HIGH (ref 6–20)
BUN: 23 mg/dL — ABNORMAL HIGH (ref 6–20)
CO2: 26 mmol/L (ref 22–32)
CO2: 26 mmol/L (ref 22–32)
Calcium: 8.3 mg/dL — ABNORMAL LOW (ref 8.9–10.3)
Calcium: 8.1 mg/dL — ABNORMAL LOW (ref 8.9–10.3)
Chloride: 96 mmol/L — ABNORMAL LOW (ref 98–111)
Chloride: 101 mmol/L (ref 98–111)
Creatinine, Ser: 1 mg/dL (ref 0.61–1.24)
Creatinine, Ser: 0.87 mg/dL (ref 0.61–1.24)
GFR calc Af Amer: >60 mL/min (ref >60)
GFR calc Af Amer: >60 mL/min (ref >60)
GFR calc non Af Amer: >60 mL/min (ref >60)
GFR calc non Af Amer: >60 mL/min (ref >60)
Glucose, Bld: 135 mg/dL — ABNORMAL HIGH (ref 70–99)
Glucose, Bld: 115 mg/dL — ABNORMAL HIGH (ref 70–99)
Phosphorus: 3.3 mg/dL (ref 2.5–4.6)
Phosphorus: 2.3 mg/dL — ABNORMAL LOW (ref 2.5–4.6)
Potassium: 4.5 mmol/L (ref 3.5–5.1)
Potassium: 4.5 mmol/L (ref 3.5–5.1)
Sodium: 131 mmol/L — ABNORMAL LOW (ref 135–145)
Sodium: 134 mmol/L — ABNORMAL LOW (ref 135–145)

## 2019-08-20 LAB — PREPARE FRESH FROZEN PLASMA
Unit division: 0
Unit division: 0
Unit division: 0
Unit division: 0
Unit division: 0

## 2019-08-20 LAB — BPAM FFP
Blood Product Expiration Date: 202012222359
Blood Product Expiration Date: 202012222359
Blood Product Expiration Date: 202012222359
Blood Product Expiration Date: 202012222359
Blood Product Expiration Date: 202012232359
Blood Product Expiration Date: 202012232359
ISSUE DATE / TIME: 202012172225
ISSUE DATE / TIME: 202012180045
ISSUE DATE / TIME: 202012180223
ISSUE DATE / TIME: 202012180400
ISSUE DATE / TIME: 202012180914
ISSUE DATE / TIME: 202012181126
Unit Type and Rh: 7300
Unit Type and Rh: 7300
Unit Type and Rh: 7300
Unit Type and Rh: 7300
Unit Type and Rh: 7300
Unit Type and Rh: 7300

## 2019-08-20 LAB — MAGNESIUM: Magnesium: 2.5 mg/dL — ABNORMAL HIGH (ref 1.7–2.4)

## 2019-08-20 LAB — GLUCOSE, CAPILLARY
Glucose-Capillary: 123 mg/dL — ABNORMAL HIGH (ref 70–99)
Glucose-Capillary: 119 mg/dL — ABNORMAL HIGH (ref 70–99)
Glucose-Capillary: 125 mg/dL — ABNORMAL HIGH (ref 70–99)
Glucose-Capillary: 103 mg/dL — ABNORMAL HIGH (ref 70–99)
Glucose-Capillary: 109 mg/dL — ABNORMAL HIGH (ref 70–99)

## 2019-08-20 LAB — COOXEMETRY PANEL
Carboxyhemoglobin: 1.6 % — ABNORMAL HIGH (ref 0.5–1.5)
Methemoglobin: 1.1 % (ref 0.0–1.5)
O2 Saturation: 63.7 %
Total hemoglobin: 9.4 g/dL — ABNORMAL LOW (ref 12.0–16.0)

## 2019-08-20 LAB — HEPARIN LEVEL (UNFRACTIONATED)
Heparin Unfractionated: <0.1 IU/mL — ABNORMAL LOW (ref 0.30–0.70)
Heparin Unfractionated: 0.1 IU/mL — ABNORMAL LOW (ref 0.30–0.70)
Heparin Unfractionated: <0.1 IU/mL — ABNORMAL LOW (ref 0.30–0.70)

## 2019-08-20 LAB — DIGOXIN LEVEL: Digoxin Level: 4.1 ng/mL (ref 0.8–2.0)

## 2019-08-20 MED ORDER — CHLORHEXIDINE GLUCONATE 0.12% ORAL RINSE (MEDLINE KIT)
15.0000 mL | Freq: Two times a day (BID) | OROMUCOSAL | Status: DC
  Administered 2019-08-20 – 2019-08-24 (×9): 15 mL via OROMUCOSAL

## 2019-08-20 MED ORDER — POLYETHYLENE GLYCOL 3350 17 G PO PACK
17.0000 g | PACK | Freq: Every day | ORAL | Status: DC
  Administered 2019-08-20 – 2019-08-25 (×6): 17 g via ORAL
  Filled 2019-08-20 (×7): qty 1

## 2019-08-20 MED ORDER — ORAL CARE MOUTH RINSE
15.0000 mL | OROMUCOSAL | Status: DC
  Administered 2019-08-20 – 2019-08-24 (×45): 15 mL via OROMUCOSAL

## 2019-08-20 MED ORDER — DEXMEDETOMIDINE HCL IN NACL 400 MCG/100ML IV SOLN
0.4000 ug/kg/h | INTRAVENOUS | Status: DC
  Administered 2019-08-20: 06:00:00 0.9 ug/kg/h via INTRAVENOUS
  Administered 2019-08-20: 01:00:00 0.4 ug/kg/h via INTRAVENOUS
  Administered 2019-08-20: 12:00:00 0.6 ug/kg/h via INTRAVENOUS
  Administered 2019-08-20: 23:00:00 1.2 ug/kg/h via INTRAVENOUS
  Administered 2019-08-20: 18:00:00 0.8 ug/kg/h via INTRAVENOUS
  Administered 2019-08-21: 1.2 ug/kg/h via INTRAVENOUS
  Administered 2019-08-21: 1 ug/kg/h via INTRAVENOUS
  Administered 2019-08-21 (×2): 1.2 ug/kg/h via INTRAVENOUS
  Administered 2019-08-21 – 2019-08-22 (×2): 0.8 ug/kg/h via INTRAVENOUS
  Administered 2019-08-22: 1.2 ug/kg/h via INTRAVENOUS
  Administered 2019-08-22 – 2019-08-23 (×6): 0.8 ug/kg/h via INTRAVENOUS
  Administered 2019-08-24: 09:00:00 0.6 ug/kg/h via INTRAVENOUS
  Administered 2019-08-24: 1 ug/kg/h via INTRAVENOUS
  Filled 2019-08-20 (×17): qty 100
  Filled 2019-08-20: qty 200
  Filled 2019-08-20 (×3): qty 100

## 2019-08-20 NOTE — Progress Notes (Signed)
Initial Nutrition Assessment  DOCUMENTATION CODES:   Obesity unspecified  INTERVENTION:  Once NGT placed, recommend initiating Vital 1.2 @ 40 ml/hr (960 ml/day) -Prostat 30 ml BID via tube -B complex with C daily via tube  Tube feed regimen provides 1352 kcal, 102 grams of protein, 778 ml free H20   NUTRITION DIAGNOSIS:   Inadequate oral intake related to inability to eat as evidenced by NPO status.   GOAL:   Provide needs based on ASPEN/SCCM guidelines   MONITOR:   Vent status, Labs, I & O's, Weight trends  REASON FOR ASSESSMENT:   Ventilator    ASSESSMENT:  RD working remotely.  58 year old male with history of Bentall procedure, MVR, Afib, T2DM, CAD with chronic systolic and diastolic CHF, Bells palsy, h/o CVA, OSA, GERD, HTN, Gout, PVD s/p mitral valve replacement 2016, testicular cancer, and tubulovillous adenoma of colon who presented to ED with fatigue, weakness, and hypotension. Patient found to be profoundly bradycardic with severe left bundle branch block; severely hyperkalemic with new onset acute renal failure. Patient admitted to ICU for emergent hemodialysis.  12/17 - R IJ HD catheter 12/17 - Intubated  Per Notes: -Failed SBT- NG to be placed and start tube feeds -Severe life-threatening hyperkalemia (8.4 on admission), renal failure likely secondary to use of diuretics and poor po intake; now on CRRT -Cardiogenic shock  BUE mild pitting edema; BLE deep pitting edema per review of RN flowsheet.  Current wt 87.3 kg (192.1 lbs) Weights 77 kg - 88.1 kg over the past 4 months. Suspect weight fluctuations secondary to fluid status.   I/Os: -658 ml since admit    -2066 ml x 24 hrs UOP: 1965 ml x 24hrs  Patient is currently intubated on ventilator support MV: 10.2 L/min Temp (24hrs), Avg:96.6 F (35.9 C), Min:94.3 F (34.6 C), Max:98.1 F (36.7 C)  Not on Propofol  Medications reviewed and include: SS Novolog Drips: Prismasol  BGK NaCl Amiodarone Precedex Fentanyl Heparin Levo Labs: CBGs 119-125, Na 131 (L), BUN 34 (H), K 4.5 (WNL), PO4 3.3 WNL), Mg 2.5 (H), BNP 106 (H)   NUTRITION - FOCUSED PHYSICAL EXAM: Unable to complete at this time, RD working remotely.  Diet Order:   Diet Order            Diet NPO time specified Except for: Sips with Meds  Diet effective now              EDUCATION NEEDS:   No education needs have been identified at this time  Skin:  Skin Assessment: Reviewed RN Assessment(incision;closed; right neck)  Last BM:  PTA  Height:   Ht Readings from Last 1 Encounters:  08/17/2019 5' (1.524 m)    Weight:   Wt Readings from Last 1 Encounters:  08/20/19 87.3 kg    Ideal Body Weight:  48.2 kg  BMI:  Body mass index is 37.59 kg/m.  Estimated Nutritional Needs:   Kcal:  [id number]  Protein:  96  Fluid:  per MD   Lajuan Lines, RD, LDN Clinical Nutrition [facility] Telephone [phone] After Hours/Weekend Pager: [phone]

## 2019-08-20 NOTE — Progress Notes (Signed)
NAME:  Lance Cook, MRN:  [id number], DOB:  1961/01/16, LOS: 2 ADMISSION DATE:  08/03/2019, CONSULTATION DATE:  08/17/2019 REFERRING MD:  Dr Sherry Ruffing, CHIEF COMPLAINT:  Hyperkalemia    Brief History   58 yo male found to have severe life-threatening hyperkalemia, PCCM consulted for admission and need for urgent iHD.  History of present illness   58 year old man, history of Bentall procedure, MVR, atrial fibrillation, CAD with chronic systolic and diastolic CHF.  Came to the ER with fatigue, weakness and hypotension.  He was found to be profoundly bradycardic with severe left bundle branch block and QRS widening on EKG.  Found to be severely hyperkalemic with new acute renal failure.  He is on digoxin at baseline and his digoxin level was 2.6.  He will be admitted emergently to the ICU for hemodialysis. He was given digibind for dig toxicity.   Significant Hospital Events   12/17 Admitted, urgent HD 12/18 had HD catheter in carotid removed in OR. Returned intubated  Consults:  Nephrology Heart Failure  Procedures:  R IJ HD catheter 12/17 >> Misplaced in carotid, removed in OR 12/18  L IJ HD catheter 12/17 >>   Significant Diagnostic Tests:  NM cardiac amyloid scan 10/21 >> equivocal for any evidence anmyloidosis  ECHO 12/18 >  1. Left ventricular ejection fraction, by visual estimation, is 30 to 35%. The left ventricle has severely decreased function. Severely increased left ventricular size. There is no left ventricular hypertrophy.  2. Definity contrast agent was given IV to delineate the left ventricular endocardial borders.  3. Global hypokinesis, though function more preserved at base than apex. LV contrast excludes apical thrombus.  4. Global right ventricle has normal systolic function.The right ventricular size is normal. No increase in right ventricular wall thickness.  5. Left atrial size was normal.  6. Right atrial size was normal.  7. The mitral valve has been  repaired/replaced. Mild mitral valve regurgitation. Moderate mitral stenosis.  8. The tricuspid valve is normal in structure. Tricuspid valve regurgitation mild-moderate.  9. 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 10. The aortic valve is normal in structure. Aortic valve regurgitation is trivial by color flow Doppler. Structurally normal aortic valve, with no evidence of sclerosis or stenosis. 11. Aortic valve with trivial perivalvular leak. 12. The pulmonic valve was grossly normal. Pulmonic valve regurgitation is not visualized by color flow Doppler. 13. S/P Bentall. 14. Moderately elevated pulmonary artery systolic pressure. 15. A pacer wire is visualized in the RV and RA. 16. The inferior vena cava is dilated in size with <50% respiratory variability, suggesting right atrial pressure of 15 mmHg. 17. History of mechanical aortic and mitral valves. Leaflets not well visualized, could consider fluroscopy of leaflets, but gradients not suggestive of obstruction.  CXR 12/17 > New left internal jugular central line tip in the SVC above the right atrium. No pneumothorax. No other change.  Micro Data:  COVID 12/17 > negative  Blood cultures 12/17 >  MRSA PCR 12/17 >negative  Urine culture 12/18 >    Antimicrobials:     Interim history/subjective:  Returned from OR intubated. AOx3 this morning.   Objective   Blood pressure 126/78, pulse 70, temperature (!) 96.1 F (35.6 C), temperature source Axillary, resp. rate (!) 25, height 5' (1.524 m), weight 87.3 kg, SpO2 100 %. CVP:  [8 mmHg-24 mmHg] 13 mmHg  Vent Mode: PRVC FiO2 (%):  [40 %-100 %] 40 % Set Rate:  [14 bmp-25 bmp] 25 bmp Vt Set:  [  400 mL-420 mL] 420 mL PEEP:  [5 cmH20] 5 cmH20 Plateau Pressure:  [20 [id number] cmH20] 20 cmH20   Intake/Output Summary (Last 24 hours) at 08/20/2019 1118 Last data filed at 08/20/2019 1100 Gross per 24 hour  Intake 3055.15 ml  Output 5031 ml  Net -1975.85 ml   Filed Weights    08/29/2019 1400 08/24/2019 0500 08/20/19 0500  Weight: 80.3 kg 85 kg 87.3 kg    Examination: General: Chronically ill appearing elderly male, mildly anxious, in NAD HEENT: [place]/AT, MM pink/moist, PERRL,  Neuro: Alert and oriented, able to follow all commands, non-focal  CV: s1s2 regular rate and rhythm, no murmur, rubs, or gallops,  PULM:  Posterior expiratory wheeze and faint crackles, no increased work of breathing GI: soft, bowel sounds active in all 4 quadrants, non-tender, non-distended Extremities: warm/dry, lower extremity  edema  Skin: no rashes or lesions  Resolved Hospital Problem list     Assessment & Plan:  [name] is a 57 yo gentleman with significant cardiac history including HFrEF, s/p MVR, s/p Bentall procedure who presents with:  Acute hypoxemic respiratory failure - intubated in the OR, ongoing vent management  - failed SBT for apnea  Acute Encephalopathy - delirium, metabolic, digoxin toxicity, renal failure.  - CAM ICU positive - precedex for sedation, avoid benzos  Digoxin Toxicity - level on 12/18 was 9. Was s/p 2 units digibind, now on dialysis - repeat level sent this am stat  Severe life-threatening hyperkalemia  -Due to acute on chronic (stage II-III) renal failure.  Etiology of his renal failure likely includes use of diuretics, poor p.o. intake.  He is on potassium supplementation as well. Also digoxin - now on CRRT - nephrology following   Digoxin toxicity -Digoxin level was 9 on 12/18. Was 2.6 on admit received Digibind 2 vials Plan: - CRRT Repeat dig level this AM stat Hold home Digoxin   Cardiogenic Shock Chronic systolic and diastolic CHF with CAD Mechanical mitral valve,  Chronic atrial fibrillation AICD/pacer.  -Admitted with severe left bundle branch block, wide-complex EKG, enlarged T waves, severe bradycardia. Again see with wide complex tachycardia overnight requiring Amiodarone bolus  -Baseline anticoagulation with coumadin    Plan: Heparin is back on  Hold home Aldactone and Lopressor  Continue midodrine  Continue norepinephrine, stop dopamine CRRT for volume removal Suspect wide complex tachycardia secondary to acute electrolyte abnormalities in the setting of heart disease  DM type 2 Plan SSI  Best practice:  Diet: NPO. Will insert NG and start tube feeds. Failed SBT Pain/Anxiety/Delirium protocol (if indicated): NA VAP protocol (if indicated): ordered DVT prophylaxis: heparin gtt GI prophylaxis: PPI Glucose control: SI per protocol Mobility: BR Code Status: Full Family Communication: discussed with pt 12/17 Disposition: ICU  Labs   CBC: Recent Labs  Lab 08/14/2019 1424 08/14/2019 0048 08/27/2019 1233 08/22/2019 1643 08/23/2019 1725 08/08/2019 2220 08/20/19 0129  WBC 19.1* 25.3*  --   --   --   --   --   NEUTROABS 17.0*  --   --   --   --   --   --   HGB 9.6*  11.2* 11.0* 10.2* 8.6* 10.2* 10.9* 10.9*  HCT 32.7*  33.0* 36.8* 30.0* 29.5* 30.0* 32.0* 32.0*  MCV 71.1* 70.5*  --   --   --   --   --   PLT 233 221  --   --   --   --   --     Basic Metabolic Panel: Recent  Labs  Lab 09/01/2019 1624 08/26/2019 0048 08/22/2019 0401 08/17/2019 1556 08/02/2019 1725 08/11/2019 2220 08/20/19 0129 08/20/19 0341  NA 119* 123* 125* 127* 124* 128* 129* 131*  K >7.5* 6.9* 5.6* 5.0 4.8 4.7 4.8 4.5  CL 85* 87* 86* 91*  --   --   --  96*  CO2 22 25 29 27   --   --   --  26  GLUCOSE 81 119* 110* 131*  --   --   --  135*  BUN 71* 61* 56* 50*  --   --   --  34*  CREATININE 2.54* 2.05* 1.99* 1.65*  --   --   --  1.00  CALCIUM 7.8* 8.3* 8.3* 8.8*  --   --   --  8.3*  MG 2.7* 2.6* 2.5*  --   --   --   --  2.5*  PHOS 5.8*  --   --  4.9*  --   --   --  3.3   GFR: Estimated Creatinine Clearance: 73.9 mL/min (by C-G formula based on SCr of 1 mg/dL). Recent Labs  Lab 08/17/2019 1424 08/17/2019 1624 08/29/2019 0048  WBC 19.1*  --  25.3*  LATICACIDVEN 1.8 2.0*  --     Liver Function Tests: Recent Labs  Lab 09/01/2019 0048  08/17/2019 0401 08/11/2019 1556 08/20/19 0341  AST 34 25  --   --   ALT 16 15  --   --   ALKPHOS 54 59  --   --   BILITOT 0.5 1.0  --   --   PROT 5.3* 5.4*  --   --   ALBUMIN 2.3* 2.4* 2.3* 2.3*   No results for input(s): LIPASE, AMYLASE in the last 168 hours. No results for input(s): AMMONIA in the last 168 hours.  ABG    Component Value Date/Time   PHART 7.358 08/20/2019 0129   PCO2ART 53.7 (H) 08/20/2019 0129   PO2ART 95.0 08/20/2019 0129   HCO3 30.4 (H) 08/20/2019 0129   TCO2 32 08/20/2019 0129   ACIDBASEDEF 1.0 01/26/2015 0425   O2SAT 63.7 08/20/2019 0200     Coagulation Profile: Recent Labs  Lab 08/16/19 0000 08/22/2019 1624 08/08/2019 0556 08/15/2019 1234  INR 2.1 5.3* 2.4* 2.2*    Cardiac Enzymes: No results for input(s): CKTOTAL, CKMB, CKMBINDEX, TROPONINI in the last 168 hours.  HbA1C: Hgb A1c MFr Bld  Date/Time Value Ref Range Status  02/14/2019 06:08 AM 5.2 4.8 - 5.6 % Final    Comment:    (NOTE) Pre diabetes:          5.7%-6.4% Diabetes:              >6.4% Glycemic control for   <7.0% adults with diabetes   01/22/2015 12:43 PM 5.9 (H) 4.8 - 5.6 % Final    Comment:    (NOTE)         Pre-diabetes: 5.7 - 6.4         Diabetes: >6.4         Glycemic control for adults with diabetes: <7.0     CBG: Recent Labs  Lab 08/11/2019 1557 08/30/2019 1954 08/26/2019 2357 08/20/19 0350 08/20/19 0752  GLUCAP 110* 118* 138* 123* 119*    Critical care time: 16   The patient is critically ill with multiple organ systems failure and requires high complexity decision making for assessment and support, frequent evaluation and titration of therapies, application of advanced monitoring technologies and extensive interpretation of multiple  databases.   Critical Care Time devoted to patient care services described in this note is 39 minutes. This time reflects time of care of this [place] . This critical care time does not reflect separately billable  procedures or procedure time, teaching time or supervisory time of PA/NP/Med student/Med Resident etc but could involve care discussion time.  [facility]one [facility] 08/20/2019 11:18 AM  Pager: [phone] After hours pager: [phone]

## 2019-08-20 NOTE — Progress Notes (Addendum)
Lance Cook Progress Note Patient Name: Lance Cook DOB: 06-Mar-1961 MRN: [id number]   Date of Service  08/20/2019  HPI/Events of Note  Ventilator asynchrony - Not improved with increased sedation.   eICU Interventions  Will order: 1. Increase PRVC rate to 24. 2. Precedex IV infusion. Titrate to RASS = 0 to -1.  3. Portable CXR STAT. 4. Will ask ground team to evaluate him at bedside.      Intervention Category Major Interventions: Respiratory failure - evaluation and management  Lance Cook [name] 08/20/2019, 12:08 AM

## 2019-08-20 NOTE — Progress Notes (Signed)
[facility] NEPHROLOGY PROGRESS NOTE  Assessment/ Plan: Pt is a 58 y.o. yo male CHF, mechanical mitral and aortic valve A. fib, VT arrest, DM, potassium of 8.4 associated with hypotension and AKI on admission.  #Severe hyperkalemia with EKG changes due to use of spironolactone, KCl and AKI: Received urgent dialysis on admission, started CRRT on 12/18 for fluid overload.   Potassium level improved.  #Acute on CKD stage III: Baseline creatinine around 1.3-1.6, likely ischemic ATN due to shock.  Nonoliguric.  Monitor BMP.  CRRT as above for fluid management.  Heparin for anticoagulation, 4K bath.  UF 100 to 150 cc an hour.  #Acute on chronic hyponatremia: Probably due to diuretics.  He was on metolazone, torsemide and Aldactone at home.  Sodium level gradually improved.  #Acute on Chronic CHF, aortic and mitral valve replacement: Per cardiology.  UF with CRRT.  #Hypotension/shock: Blood pressure is improving.  On Levophed.  #Acute respiratory failure: Intubated, per PCCM.  Discussed with ICU nurse.  Subjective: Seen and examined at bedside.  Intubated.  Tolerating CRRT well.  On Levophed.  Objective Vital signs in last 24 hours: Vitals:   08/20/19 0200 08/20/19 0300 08/20/19 0500 08/20/19 0755  BP: (!) 93/51 (!) 111/51 122/63   Pulse: 93 86 78   Resp: (!) 27 (!) 25 (!) 25   Temp:  (!) 97.1 F (36.2 C)  (!) 96.1 F (35.6 C)  TempSrc:  Axillary  Axillary  SpO2: 100% 100% 100%   Weight:   87.3 kg   Height:       Weight change: 7.013 kg  Intake/Output Summary (Last 24 hours) at 08/20/2019 0828 Last data filed at 08/20/2019 0800 Gross per 24 hour  Intake 2919.98 ml  Output 4491 ml  Net -1571.02 ml       Labs: Basic Metabolic Panel: Recent Labs  Lab 08/15/2019 1624 08/23/2019 1639 08/17/2019 0401 08/16/2019 1556 08/20/2019 2220 08/20/19 0129 08/20/19 0341  NA 119*  --  125* 127* 128* 129* 131*  K >7.5*  --  5.6* 5.0 4.7 4.8 4.5  CL 85*   < > 86* 91*  --   --   96*  CO2 22   < > 29 27  --   --  26  GLUCOSE 81   < > 110* 131*  --   --  135*  BUN 71*   < > 56* 50*  --   --  34*  CREATININE 2.54*   < > 1.99* 1.65*  --   --  1.00  CALCIUM 7.8*   < > 8.3* 8.8*  --   --  8.3*  PHOS 5.8*  --   --  4.9*  --   --  3.3   < > = values in this interval not displayed.   Liver Function Tests: Recent Labs  Lab 08/02/2019 0048 08/12/2019 0401 08/24/2019 1556 08/20/19 0341  AST 34 25  --   --   ALT 16 15  --   --   ALKPHOS 54 59  --   --   BILITOT 0.5 1.0  --   --   PROT 5.3* 5.4*  --   --   ALBUMIN 2.3* 2.4* 2.3* 2.3*   No results for input(s): LIPASE, AMYLASE in the last 168 hours. No results for input(s): AMMONIA in the last 168 hours. CBC: Recent Labs  Lab 08/20/2019 1424 08/04/2019 0048 08/15/2019 1725 08/03/2019 2220 08/20/19 0129  WBC 19.1* 25.3*  --   --   --  NEUTROABS 17.0*  --   --   --   --   HGB 9.6*  11.2* 11.0* 10.2* 10.9* 10.9*  HCT 32.7*  33.0* 36.8* 30.0* 32.0* 32.0*  MCV 71.1* 70.5*  --   --   --   PLT 233 221  --   --   --    Cardiac Enzymes: No results for input(s): CKTOTAL, CKMB, CKMBINDEX, TROPONINI in the last 168 hours. CBG: Recent Labs  Lab 08/10/2019 1557 08/07/2019 1954 08/05/2019 2357 08/20/19 0350 08/20/19 0752  GLUCAP 110* 118* 138* 123* 119*    Iron Studies: No results for input(s): IRON, TIBC, TRANSFERRIN, FERRITIN in the last 72 hours. Studies/Results: DG CHEST PORT 1 VIEW  Result Date: 08/20/2019 CLINICAL DATA:  Intubation EXAM: PORTABLE CHEST 1 VIEW COMPARISON:  08/07/2019 FINDINGS: The endotracheal tube terminates above the carina by approximately 2.7 cm. There is a left-sided central venous catheter that terminates over the SVC. There is a left-sided pacemaker/ICD in place. There is a line coursing along the patient's medial right chest which is favored to be external to the patient sign. There appear to be a few pockets of gas in the low right neck. There are worsening pulmonary opacities bilaterally. The  heart size is enlarged. Patient is status post prior median sternotomy. The lung volumes are low. IMPRESSION: 1. Lines and tubes as above. 2. There is a line coursing over the patient's medial right chest wall which is favored to be external to the patient. 3. Pockets of subcutaneous gas in the low right neck, likely related to recent surgical intervention for removal of the patient's malpositioned right central venous catheter. 4. Worsening pulmonary opacities bilaterally which may represent developing pulmonary edema or an infectious process. 5. Cardiomegaly. Low lung volumes with probable small bilateral pleural effusions. 6. No pneumothorax. Electronically Signed   By: Constance Holster M.D.   On: 08/20/2019 01:03   DG Chest Portable 1 View  Result Date: 08/17/2019 CLINICAL DATA:  Central line placement EXAM: PORTABLE CHEST 1 VIEW COMPARISON:  Earlier same day FINDINGS: Left internal jugular central line has its tip in the SVC above the right atrium. No pneumothorax. Right internal jugular central line remains in place, crossing the midline towards the left. As noted previously, arterial placement not excluded. Pacemaker/AICD remains in place. Chronic cardiomegaly with mitral valve replacement. Persistent bibasilar atelectasis. IMPRESSION: New left internal jugular central line tip in the SVC above the right atrium. No pneumothorax. No other change. Electronically Signed   By: Nelson Chimes M.D.   On: 08/13/2019 17:21   DG Chest Port 1 View  Result Date: 09/01/2019 CLINICAL DATA:  Central line placement. EXAM: PORTABLE CHEST 1 VIEW COMPARISON:  08/05/2019 at 2:33 p.m. FINDINGS: Right internal jugular central venous line has placed. Tip extends across the midline to the left. This could be arterial in location. Lung appearance is stable from prior exam. There is no pneumothorax. IMPRESSION: 1. New right internal jugular central venous line tip crosses the right neck base to the left upper mediastinum.  This is concerning for placement within the innominate artery. It does not track towards the SVC. Critical Value/emergent results were called by telephone at the time of interpretation on 08/24/2019 at 4:25 pm to emergency room physician, who verbally acknowledged these results. 2. No pneumothorax.  No other change from the earlier study. Electronically Signed   By: Lajean Manes M.D.   On: 08/24/2019 16:25   DG Chest Portable 1 View  Result Date: 08/28/2019 CLINICAL  DATA:  Bradycardia. Hypotension. EXAM: PORTABLE CHEST 1 VIEW COMPARISON:  06/29/2019 FINDINGS: There is chronic cardiomegaly. Prosthetic mitral valve. AICD in place. Chronic prominence of the main pulmonary artery. Slight atelectasis at the right lung base. The small right effusion seen on the prior study is not apparent on today's exam. Left lung is clear. No acute bone abnormality. IMPRESSION: 1. Chronic cardiomegaly. 2. Slight atelectasis at the right lung base. 3. The small right effusion seen on the prior study is not apparent on today's exam. Electronically Signed   By: Lorriane Shire M.D.   On: 08/20/2019 14:50    Medications: Infusions: .  prismasol BGK 4/2.5 500 mL/hr at 08/20/19 0335  .  prismasol BGK 4/2.5 300 mL/hr at 08/08/2019 1736  . sodium chloride 10 mL/hr at 08/20/19 0800  . amiodarone 30 mg/hr (08/20/19 0824)  . dexmedetomidine (PRECEDEX) IV infusion 0.6 mcg/kg/hr (08/20/19 0800)  . DOPamine Stopped (08/20/19 0051)  . fentaNYL infusion INTRAVENOUS Stopped (08/20/19 0748)  . heparin 1,200 Units/hr (08/20/19 0800)  . norepinephrine (LEVOPHED) Adult infusion 10 mcg/min (08/20/19 0801)  . prismasol BGK 4/2.5 1,500 mL/hr at 08/20/19 0816    Scheduled Medications: . albuterol  2.5 mg Nebulization Q4H  . amiodarone  150 mg Intravenous Once  . chlorhexidine gluconate (MEDLINE KIT)  15 mL Mouth Rinse BID  . Chlorhexidine Gluconate Cloth  6 each Topical Q0600  . insulin aspart  2-6 Units Subcutaneous Q4H  . mouth rinse   15 mL Mouth Rinse 10 times per day  . methocarbamol  500 mg Oral TID  . pantoprazole  40 mg Oral Daily    have reviewed scheduled and prn medications.  Physical Exam: General: Intubated, sedated Heart:RRR, s1s2 nl Lungs: Coarse breath sound bilateral Abdomen:soft,  non-distended Extremities:No edema Dialysis Access: Left IJ temporary catheter.  Abner Ardis Reesa Chew [place] 08/20/2019,8:28 AM  LOS: 2 days  Pager: [phone]

## 2019-08-20 NOTE — Progress Notes (Signed)
This chaplain was pastorally present with the Pt. and his RN-Demetria.  The chaplain shared the message "I love you" from the Pt. wife with the Pt.  Chg RN-Patrick confirmed the tenative plans to facilitate a phone call from the Pt. pastor today.  F/U spiritual care is available as needed.

## 2019-08-20 NOTE — Consult Note (Signed)
    Subjective  - POD #1  Intubated and sedated   Physical Exam:  Right neck incision intact with small hematoma Follows simple commands, neuro intact       Assessment/Plan:  POD #1  S/p removal of HD catheter from right carotid bifurcation: repair required patch angioplasty due to the location of cannulation of catheter.  He is stable from vascular perspective.  WIll sign off.  Scheduled for f/u in 2-3 weeks  Lance Cook 08/20/2019 11:26 AM --  Vitals:   08/20/19 1045 08/20/19 1100  BP:  126/78  Pulse: 71 70  Resp: (!) 25 (!) 25  Temp:    SpO2: 100% 100%    Intake/Output Summary (Last 24 hours) at 08/20/2019 1126 Last data filed at 08/20/2019 1100 Gross per 24 hour  Intake 3055.15 ml  Output 5031 ml  Net -1975.85 ml     Laboratory CBC    Component Value Date/Time   WBC 25.3 (H) 08/15/2019 0048   HGB 10.9 (L) 08/20/2019 0129   HGB 10.3 (L) 01/20/2019 1252   HGB 13.2 02/03/2012 1144   HCT 32.0 (L) 08/20/2019 0129   HCT 35.5 (L) 01/20/2019 1252   HCT 41.5 02/03/2012 1144   PLT 221 08/04/2019 0048   PLT 229 01/20/2019 1252    BMET    Component Value Date/Time   NA 131 (L) 08/20/2019 0341   NA 133 (L) 02/04/2019 1647   NA 138 12/09/2013 1606   K 4.5 08/20/2019 0341   K 4.3 12/09/2013 1606   CL 96 (L) 08/20/2019 0341   CL 104 12/09/2013 1606   CO2 26 08/20/2019 0341   CO2 30 12/09/2013 1606   GLUCOSE 135 (H) 08/20/2019 0341   GLUCOSE 85 12/09/2013 1606   BUN 34 (H) 08/20/2019 0341   BUN 31 (H) 02/04/2019 1647   BUN 22 (H) 12/09/2013 1606   CREATININE 1.00 08/20/2019 0341   CREATININE 0.94 01/16/2017 0903   CALCIUM 8.3 (L) 08/20/2019 0341   CALCIUM 9.2 12/09/2013 1606   GFRNONAA >60 08/20/2019 0341   GFRNONAA >60 12/09/2013 1606   GFRAA >60 08/20/2019 0341   GFRAA >60 12/09/2013 1606    COAG Lab Results  Component Value Date   INR 2.2 (H) 08/20/2019   INR 2.4 (H) 08/17/2019   INR 5.3 (HH) 08/06/2019   No results found for:  PTT  Antibiotics Anti-infectives (From admission, onward)   None       V. Leia Alf, M.D., [facility] [place] Office: [phone] Pager:  [phone]

## 2019-08-20 NOTE — Progress Notes (Signed)
ANTICOAGULATION CONSULT NOTE - Follow Up Consult  Pharmacy Consult for Heparin Indication: Hx St Jude MVR + Afib  Patient Measurements: Height: 5' (152.4 cm) Weight: 192 lb 7.4 oz (87.3 kg) IBW/kg (Calculated) : 50 Heparin Dosing Weight: 70 kg  Vital Signs: Temp: 98.9 F (37.2 C) (12/19 1900) Temp Source: Oral (12/19 1900) BP: 109/39 (12/19 2000) Pulse Rate: 87 (12/19 2000)  Labs: Recent Labs    09/01/2019 1424 08/25/2019 1424 08/17/2019 1624 08/24/2019 0048 08/20/2019 0401 08/25/2019 0556 08/22/2019 1234 08/25/2019 1556 08/25/2019 1725 08/29/2019 2220 08/20/19 0129 08/20/19 0144 08/20/19 0341 08/20/19 1152 08/20/19 1519 08/20/19 1957  HGB 9.6*  11.2*   < >  --  11.0*   < >  --   --   --  10.2* 10.9* 10.9*  --   --   --   --   --   HCT 32.7*  33.0*   < >  --  36.8*   < >  --   --   --  30.0* 32.0* 32.0*  --   --   --   --   --   PLT 233  --   --  221  --   --   --   --   --   --   --   --   --   --   --   --   LABPROT  --   --  48.3*  --   --  25.7* 24.7*  --   --   --   --   --   --   --   --   --   INR  --   --  5.3*  --   --  2.4* 2.2*  --   --   --   --   --   --   --   --   --   HEPARINUNFRC  --   --   --   --   --   --   --   --   --   --   --  <0.10*  --  0.10*  --  <0.10*  CREATININE 2.70*  --  2.54* 2.05*  --   --   --  1.65*  --   --   --   --  1.00  --  0.87  --   TROPONINIHS  --   --  33*  --   --   --   --   --   --   --   --   --   --   --   --   --    < > = values in this interval not displayed.    Estimated Creatinine Clearance: 85 mL/min (by C-G formula based on SCr of 0.87 mg/dL).   Medications:  Infusions:  .  prismasol BGK 4/2.5 500 mL/hr at 08/20/19 1519  .  prismasol BGK 4/2.5 300 mL/hr at 08/20/19 1107  . sodium chloride 10 mL/hr at 08/20/19 2000  . amiodarone 30 mg/hr (08/20/19 2029)  . dexmedetomidine (PRECEDEX) IV infusion 1 mcg/kg/hr (08/20/19 2000)  . fentaNYL infusion INTRAVENOUS 50 mcg/hr (08/20/19 2000)  . heparin 1,400 Units/hr (08/20/19 2000)   . norepinephrine (LEVOPHED) Adult infusion 12 mcg/min (08/20/19 2000)  . prismasol BGK 4/2.5 1,500 mL/hr at 08/20/19 1738    Assessment: 26 YOM on warfarin PTA for hx St Jude MVR + Afib - admitted with AKI and hyperkalemia requiring line replacement  and start of CRRT. FFP given and Heparin bridge started when INR<2.5. Pharmacy consulted to dose.   Heparin undetectable after rate adjustment, no issues per RN.  Goal of Therapy:  Heparin level 0.3-0.7 units/ml Monitor platelets by anticoagulation protocol: Yes   Plan:  -Increase heparin to 1650 units/h -Recheck heparin level in [place], PharmD, BCPS Clinical Pharmacist [phone] Please check AMION for all [place] numbers 08/20/2019

## 2019-08-20 NOTE — Progress Notes (Signed)
ANTICOAGULATION CONSULT NOTE - Follow Up Consult  Pharmacy Consult for Heparin Indication: Hx St Jude MVR + Afib  Patient Measurements: Height: 5' (152.4 cm) Weight: 192 lb 7.4 oz (87.3 kg) IBW/kg (Calculated) : 50 Heparin Dosing Weight: 70 kg  Vital Signs: Temp: 96.1 F (35.6 C) (12/19 0755) Temp Source: Axillary (12/19 0755) BP: 122/63 (12/19 0500) Pulse Rate: 78 (12/19 0500)  Labs: Recent Labs    08/02/2019 1424 08/17/2019 1424 08/06/2019 1624 08/09/2019 0048 08/31/2019 0048 08/05/2019 0401 08/31/2019 0556 08/28/2019 1234 08/10/2019 1556 08/20/2019 1725 08/04/2019 2220 08/20/19 0129 08/20/19 0144 08/20/19 0341  HGB 9.6*  11.2*   < >  --  11.0*   < >  --   --   --   --  10.2* 10.9* 10.9*  --   --   HCT 32.7*  33.0*   < >  --  36.8*   < >  --   --   --   --  30.0* 32.0* 32.0*  --   --   PLT 233  --   --  221  --   --   --   --   --   --   --   --   --   --   LABPROT  --   --  48.3*  --   --   --  25.7* 24.7*  --   --   --   --   --   --   INR  --   --  5.3*  --   --   --  2.4* 2.2*  --   --   --   --   --   --   HEPARINUNFRC  --   --   --   --   --   --   --   --   --   --   --   --  <0.10*  --   CREATININE 2.70*  --  2.54* 2.05*  --  1.99*  --   --  1.65*  --   --   --   --  1.00  TROPONINIHS  --   --  33*  --   --   --   --   --   --   --   --   --   --   --    < > = values in this interval not displayed.    Estimated Creatinine Clearance: 73.9 mL/min (by C-G formula based on SCr of 1 mg/dL).   Medications:  Infusions:  .  prismasol BGK 4/2.5 500 mL/hr at 08/20/19 0335  .  prismasol BGK 4/2.5 300 mL/hr at 08/18/2019 1736  . sodium chloride 10 mL/hr at 08/20/19 0800  . amiodarone 30 mg/hr (08/20/19 0824)  . dexmedetomidine (PRECEDEX) IV infusion 0.6 mcg/kg/hr (08/20/19 0800)  . DOPamine Stopped (08/20/19 0051)  . fentaNYL infusion INTRAVENOUS Stopped (08/20/19 0748)  . heparin 1,200 Units/hr (08/20/19 0800)  . norepinephrine (LEVOPHED) Adult infusion 10 mcg/min (08/20/19 0801)   . prismasol BGK 4/2.5 1,500 mL/hr at 08/20/19 [id number]    Assessment: Lance Cook on warfarin PTA for hx St Jude MVR + Afib - admitted with AKI and hyperkalemia requiring line replacement and start of CRRT. FFP given and Heparin bridge started when INR<2.5. Pharmacy consulted to dose.   Heparin level this afternoon is SUBtherapeutic and barely detectable after a rate increase earlier today. CBC stable. No bleeding issues noted per discussion with RN.  Goal of Therapy:  Heparin level 0.3-0.7 units/ml Monitor platelets by anticoagulation protocol: Yes   Plan:  - Increase Heparin to 1400 units/hr (14 ml/hr) - Will continue to monitor for any signs/symptoms of bleeding and will follow up with heparin level in 8 hours   Thank you for allowing pharmacy to be a part of this patient's care.  Alycia Rossetti, PharmD, BCPS Clinical Pharmacist Clinical phone for 08/20/2019: [phone] 08/20/2019 8:50 AM   **Pharmacist phone directory can now be found on [email or web address] ([email or web address]).  Listed under [place].

## 2019-08-20 NOTE — Progress Notes (Signed)
Advanced Heart Failure Rounding Note   Subjective:    Remains intubated. Awake on vent. Unable to wean vent today.   On CVVHD pulling -150 and urine output also picking up. Weight up 5 pounds but not sure if accurate   Objective:   Weight Range:  Vital Signs:   Temp:  [94.3 F (34.6 C)-98.1 F (36.7 C)] 96.1 F (35.6 C) (12/19 1258) Pulse Rate:  [66-93] 81 (12/19 1415) Resp:  [11-28] 25 (12/19 1415) BP: (74-141)/(31-78) 117/67 (12/19 1400) SpO2:  [91 %-100 %] 100 % (12/19 1415) Arterial Line BP: (75-157)/(33-62) 109/50 (12/19 1415) FiO2 (%):  [40 %-100 %] 40 % (12/19 1157) Weight:  [87.3 kg] 87.3 kg (12/19 0500)    Weight change: Filed Weights   08/11/2019 1400 08/23/2019 0500 08/20/19 0500  Weight: 80.3 kg 85 kg 87.3 kg    Intake/Output:   Intake/Output Summary (Last 24 hours) at 08/20/2019 1439 Last data filed at 08/20/2019 1400 Gross per 24 hour  Intake 2135.94 ml  Output 5280 ml  Net -3144.06 ml     Physical Exam: General:  Intubated awake.  HEENT: normal + ETT Neck: supple. R-sided surgical wound ok Trialysis cath on left Carotids 2+ bilat; no bruits. No lymphadenopathy or thryomegaly appreciated. Cor: PMI nondisplaced. Regular rate & rhythm. Mechanical s1/s2 No rubs, gallops or murmurs. Lungs: clear Abdomen: soft, nontender, nondistended. No hepatosplenomegaly. No bruits or masses. Good bowel sounds. Extremities: no cyanosis, clubbing, rash, 2-3+ edema Neuro: intubated awake  Telemetry: AF with v-pacing 80s. Personally reviewed    Labs: Basic Metabolic Panel: Recent Labs  Lab 08/09/2019 1624 08/10/2019 0048 08/11/2019 0401 08/23/2019 1556 08/07/2019 1725 08/25/2019 2220 08/20/19 0129 08/20/19 0341  NA 119* 123* 125* 127* 124* 128* 129* 131*  K >7.5* 6.9* 5.6* 5.0 4.8 4.7 4.8 4.5  CL 85* 87* 86* 91*  --   --   --  96*  CO2 _0 --   --   --  26  GLUCOSE 81 119* 110* 131*  --   --   --  135*  BUN 71* 61* 56* 50*  --   --   --  34*    CREATININE 2.54* 2.05* 1.99* 1.65*  --   --   --  1.00  CALCIUM 7.8* 8.3* 8.3* 8.8*  --   --   --  8.3*  MG 2.7* 2.6* 2.5*  --   --   --   --  2.5*  PHOS 5.8*  --   --  4.9*  --   --   --  3.3    Liver Function Tests: Recent Labs  Lab 08/17/2019 0048 08/10/2019 0401 08/06/2019 1556 08/20/19 0341  AST 34 25  --   --   ALT 16 15  --   --   ALKPHOS 54 59  --   --   BILITOT 0.5 1.0  --   --   PROT 5.3* 5.4*  --   --   ALBUMIN 2.3* 2.4* 2.3* 2.3*   No results for input(s): LIPASE, AMYLASE in the last 168 hours. No results for input(s): AMMONIA in the last 168 hours.  CBC: Recent Labs  Lab 08/09/2019 1424 08/16/2019 0048 08/28/2019 1233 08/26/2019 1643 08/27/2019 1725 08/04/2019 2220 08/20/19 0129  WBC 19.1* 25.3*  --   --   --   --   --   NEUTROABS 17.0*  --   --   --   --   --   --  HGB 9.6*  11.2* 11.0* 10.2* 8.6* 10.2* 10.9* 10.9*  HCT 32.7*  33.0* 36.8* 30.0* 29.5* 30.0* 32.0* 32.0*  MCV 71.1* 70.5*  --   --   --   --   --   PLT 233 221  --   --   --   --   --     Cardiac Enzymes: No results for input(s): CKTOTAL, CKMB, CKMBINDEX, TROPONINI in the last 168 hours.  BNP: BNP (last 3 results) Recent Labs    07/19/19 0956 08/02/19 0957 08/29/2019 1424  BNP 298.7* 167.3* 106.1*    ProBNP (last 3 results) No results for input(s): PROBNP in the last 8760 hours.    Other results:  Imaging: DG Abd 1 View  Result Date: 08/20/2019 CLINICAL DATA:  Enteric tube placement. EXAM: ABDOMEN - 1 VIEW COMPARISON:  None. FINDINGS: Enteric tube tip near the pylorus and possibly within the first portion of the duodenum. The bowel gas pattern is normal. Small amount of oral contrast in the gastric fundus. No radio-opaque calculi or other significant radiographic abnormality are seen. Prior cholecystectomy. No acute osseous abnormality. IMPRESSION: 1. Enteric tube tip near the pylorus/first portion of the duodenum. Electronically Signed   By: Titus Dubin M.D.   On: 08/20/2019 12:20   DG  CHEST PORT 1 VIEW  Result Date: 08/20/2019 CLINICAL DATA:  Intubation EXAM: PORTABLE CHEST 1 VIEW COMPARISON:  09/01/2019 FINDINGS: The endotracheal tube terminates above the carina by approximately 2.7 cm. There is a left-sided central venous catheter that terminates over the SVC. There is a left-sided pacemaker/ICD in place. There is a line coursing along the patient's medial right chest which is favored to be external to the patient sign. There appear to be a few pockets of gas in the low right neck. There are worsening pulmonary opacities bilaterally. The heart size is enlarged. Patient is status post prior median sternotomy. The lung volumes are low. IMPRESSION: 1. Lines and tubes as above. 2. There is a line coursing over the patient's medial right chest wall which is favored to be external to the patient. 3. Pockets of subcutaneous gas in the low right neck, likely related to recent surgical intervention for removal of the patient's malpositioned right central venous catheter. 4. Worsening pulmonary opacities bilaterally which may represent developing pulmonary edema or an infectious process. 5. Cardiomegaly. Low lung volumes with probable small bilateral pleural effusions. 6. No pneumothorax. Electronically Signed   By: Constance Holster M.D.   On: 08/20/2019 01:03   DG Chest Portable 1 View  Result Date: 08/06/2019 CLINICAL DATA:  Central line placement EXAM: PORTABLE CHEST 1 VIEW COMPARISON:  Earlier same day FINDINGS: Left internal jugular central line has its tip in the SVC above the right atrium. No pneumothorax. Right internal jugular central line remains in place, crossing the midline towards the left. As noted previously, arterial placement not excluded. Pacemaker/AICD remains in place. Chronic cardiomegaly with mitral valve replacement. Persistent bibasilar atelectasis. IMPRESSION: New left internal jugular central line tip in the SVC above the right atrium. No pneumothorax. No other  change. Electronically Signed   By: Nelson Chimes M.D.   On: 08/09/2019 17:21   DG Chest Port 1 View  Result Date: 08/26/2019 CLINICAL DATA:  Central line placement. EXAM: PORTABLE CHEST 1 VIEW COMPARISON:  08/14/2019 at 2:33 p.m. FINDINGS: Right internal jugular central venous line has placed. Tip extends across the midline to the left. This could be arterial in location. Lung appearance is stable from prior  exam. There is no pneumothorax. IMPRESSION: 1. New right internal jugular central venous line tip crosses the right neck base to the left upper mediastinum. This is concerning for placement within the innominate artery. It does not track towards the SVC. Critical Value/emergent results were called by telephone at the time of interpretation on 08/11/2019 at 4:25 pm to emergency room physician, who verbally acknowledged these results. 2. No pneumothorax.  No other change from the earlier study. Electronically Signed   By: Lajean Manes M.D.   On: 08/31/2019 16:25   DG Chest Portable 1 View  Result Date: 08/28/2019 CLINICAL DATA:  Bradycardia. Hypotension. EXAM: PORTABLE CHEST 1 VIEW COMPARISON:  06/29/2019 FINDINGS: There is chronic cardiomegaly. Prosthetic mitral valve. AICD in place. Chronic prominence of the main pulmonary artery. Slight atelectasis at the right lung base. The small right effusion seen on the prior study is not apparent on today's exam. Left lung is clear. No acute bone abnormality. IMPRESSION: 1. Chronic cardiomegaly. 2. Slight atelectasis at the right lung base. 3. The small right effusion seen on the prior study is not apparent on today's exam. Electronically Signed   By: Lorriane Shire M.D.   On: 08/23/2019 14:50      Medications:     Scheduled Medications: . albuterol  2.5 mg Nebulization Q4H  . amiodarone  150 mg Intravenous Once  . chlorhexidine gluconate (MEDLINE KIT)  15 mL Mouth Rinse BID  . Chlorhexidine Gluconate Cloth  6 each Topical Q0600  . insulin aspart   2-6 Units Subcutaneous Q4H  . mouth rinse  15 mL Mouth Rinse 10 times per day  . methocarbamol  500 mg Oral TID  . pantoprazole  40 mg Oral Daily  . polyethylene glycol  17 g Oral Daily     Infusions: .  prismasol BGK 4/2.5 500 mL/hr at 08/20/19 0335  .  prismasol BGK 4/2.5 300 mL/hr at 08/20/19 1107  . sodium chloride 10 mL/hr at 08/20/19 1400  . amiodarone 30 mg/hr (08/20/19 1400)  . dexmedetomidine (PRECEDEX) IV infusion 0.6 mcg/kg/hr (08/20/19 1400)  . fentaNYL infusion INTRAVENOUS Stopped (08/20/19 0748)  . heparin 1,400 Units/hr (08/20/19 1400)  . norepinephrine (LEVOPHED) Adult infusion 12 mcg/min (08/20/19 1334)  . prismasol BGK 4/2.5 1,500 mL/hr at 08/20/19 0816     PRN Medications:  sodium chloride, bisacodyl, fentaNYL, fentaNYL (SUBLIMAZE) injection, fentaNYL (SUBLIMAZE) injection, heparin, heparin, oxyCODONE, sennosides   Assessment/Plan:   1. AKI on CKD stage 3: Creatinine up to 2.7 at admission with marked hyperkalemia and hyponatremia in setting of use of torsemide, metolazone, and spironolactone at home.  He had urgent HD with improvement. Remains on CVVHD but also with good urine output.  Suspect he will recover.  - Renal following 2. Acute on chronic systolic CHF/cardiogenic shock: EF 30-35% on last echo.  [employer] CRT-D, LV not on due to diaphragmatic stimulation.  Hypotension in setting of AKI/hyperkalemia. Now on NE 12   Marked volume overload improving with CVVHD and improving u/o - Co-ox 64% on NE 12. Wean NE as tolerated - Continue CVVH for volume removal. He has a way to go.  3. VT: Patient has [employer] CRT-D device, LV lead off due to diaphragmatic pacing.  Patient has has runs of VT, now on amiodarone gtt.  - VT improved 4. Digoxin toxicity: Severely elevated digoxin level, has had digibind and HD.  - bradycardia improved 5. CAD: Last cath in 2016 with patent native coronaries, probably occluded SVG-RCA.  - no s/s ischemia  6.  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 Continue heparin gtt while off warfarin. Discussed dosing with PharmD personally. 7. Atrial fibrillation: Chronic.  Continue heparin gtt.  - rate controlled   CRITICAL CARE Performed by: Glori Bickers  Total critical care time: 35 minutes  Critical care time was exclusive of separately billable procedures and treating other patients.  Critical care was necessary to treat or prevent imminent or life-threatening deterioration.  Critical care was time spent personally by me (independent of midlevel providers or residents) on the following activities: development of treatment plan with patient and/or surrogate as well as nursing, discussions with consultants, evaluation of patient's response to treatment, examination of patient, obtaining history from patient or surrogate, ordering and performing treatments and interventions, ordering and review of laboratory studies, ordering and review of radiographic studies, pulse oximetry and re-evaluation of patient's condition.    Length of Stay: 2   Glori Bickers MD 08/20/2019, 2:39 PM  Advanced Heart Failure Team Pager [phone] (M-F; 7a - 4p)  Please contact [facility] for night-coverage after hours (4p -7a ) and weekends on [email or web address]

## 2019-08-20 NOTE — Progress Notes (Signed)
This chaplain returned the Pt. wife-Lance Cook phone call to spiritual care office.  Listening and words of reassurance were shared around the Pt.'s care.  Lance Cook asked the chaplain about transportation being provided to the hospital for her to visit the Pt. The chaplain stated she was not aware of a hospital transportation service.  The Pt. wife thanked the chaplain for the call.  The spiritual care office will provide F/U as needed.

## 2019-08-20 NOTE — Progress Notes (Signed)
[facility] for Heparin Indication: Afib, AVR and MVR  Allergies  Allergen Reactions  . Feraheme [Ferumoxytol] Shortness Of Breath    SOB/Tachycardia  . Penicillins Hives and Rash    Has patient had a PCN reaction causing immediate rash, facial/tongue/throat swelling, SOB or lightheadedness with hypotension: Yes Has patient had a PCN reaction causing severe rash involving mucus membranes or skin necrosis: Yes Has patient had a PCN reaction that required hospitalization unsure Has patient had a PCN reaction occurring within the last 10 years: 2010-2012 If all of the above answers are "NO", then may proceed with Cephalosporin use.  [name] Other (See Comments)    CDIFF, when taking oral tablets   . Tape Other (See Comments) and Itching    Skin irritation    Patient Measurements: Height: 5' (152.4 cm) Weight: 187 lb 6.3 oz (85 kg) IBW/kg (Calculated) : 50 Heparin Dosing Weight: 71kg  Vital Signs: Temp: 97.6 F (36.4 C) (12/18 1950) Temp Source: Axillary (12/18 1950) BP: 93/51 (12/19 0200) Pulse Rate: 93 (12/19 0200)  Labs: Recent Labs    08/11/2019 1424 08/21/2019 1424 08/30/2019 1624 08/20/2019 0048 08/25/2019 0401 08/25/2019 0556 08/18/2019 1234 08/12/2019 1556 08/16/2019 1725 08/25/2019 2220 08/20/19 0129 08/20/19 0144  HGB 9.6*  11.2*   < >  --  11.0*  --   --   --   --  10.2* 10.9* 10.9*  --   HCT 32.7*  33.0*   < >  --  36.8*  --   --   --   --  30.0* 32.0* 32.0*  --   PLT 233  --   --  221  --   --   --   --   --   --   --   --   LABPROT  --   --  48.3*  --   --  25.7* 24.7*  --   --   --   --   --   INR  --   --  5.3*  --   --  2.4* 2.2*  --   --   --   --   --   HEPARINUNFRC  --   --   --   --   --   --   --   --   --   --   --  <0.10*  CREATININE 2.70*  --  2.54* 2.05* 1.99*  --   --  1.65*  --   --   --   --   TROPONINIHS  --   --  33*  --   --   --   --   --   --   --   --   --    < > = values in this interval  not displayed.    Estimated Creatinine Clearance: 44.2 mL/min (A) (by C-G formula based on SCr of 1.65 mg/dL (H)).  Assessment: 58 y.o. male with h/o Afib and AVR/MVR, Coumadin on hold, for heparin  Goal of Therapy:  Heparin level 0.3-0.7 units/ml Monitor platelets by anticoagulation protocol: Yes   Plan:  Increase Heparin 1200 units/hr Check heparin level in 8 hours.  Phillis Knack, PharmD, BCPS  08/20/2019,2:47 AM

## 2019-08-21 DIAGNOSIS — T460X1A Poisoning by cardiac-stimulant glycosides and drugs of similar action, accidental (unintentional), initial encounter: Secondary | ICD-10-CM

## 2019-08-21 DIAGNOSIS — D689 Coagulation defect, unspecified: Secondary | ICD-10-CM

## 2019-08-21 DIAGNOSIS — I472 Ventricular tachycardia: Secondary | ICD-10-CM

## 2019-08-21 LAB — PROTIME-INR
INR: 6.5 (ref 0.8–1.2)
INR: 5.7 (ref 0.8–1.2)
INR: 2.4 — ABNORMAL HIGH (ref 0.8–1.2)
Prothrombin Time: 57.4 seconds — ABNORMAL HIGH (ref 11.4–15.2)
Prothrombin Time: 51.7 seconds — ABNORMAL HIGH (ref 11.4–15.2)
Prothrombin Time: 25.7 seconds — ABNORMAL HIGH (ref 11.4–15.2)

## 2019-08-21 LAB — COOXEMETRY PANEL
Carboxyhemoglobin: 0.7 % (ref 0.5–1.5)
Methemoglobin: 0.8 % (ref 0.0–1.5)
O2 Saturation: 65.4 %
Total hemoglobin: 8.8 g/dL — ABNORMAL LOW (ref 12.0–16.0)

## 2019-08-21 LAB — GLUCOSE, CAPILLARY
Glucose-Capillary: 104 mg/dL — ABNORMAL HIGH (ref 70–99)
Glucose-Capillary: 95 mg/dL (ref 70–99)
Glucose-Capillary: 96 mg/dL (ref 70–99)
Glucose-Capillary: 97 mg/dL (ref 70–99)
Glucose-Capillary: 98 mg/dL (ref 70–99)
Glucose-Capillary: 99 mg/dL (ref 70–99)

## 2019-08-21 LAB — RENAL FUNCTION PANEL
Albumin: 1.8 g/dL — ABNORMAL LOW (ref 3.5–5.0)
Albumin: 2.2 g/dL — ABNORMAL LOW (ref 3.5–5.0)
Anion gap: 7 (ref 5–15)
Anion gap: 8 (ref 5–15)
BUN: 19 mg/dL (ref 6–20)
BUN: 17 mg/dL (ref 6–20)
CO2: 25 mmol/L (ref 22–32)
CO2: 25 mmol/L (ref 22–32)
Calcium: 7.5 mg/dL — ABNORMAL LOW (ref 8.9–10.3)
Calcium: 8.1 mg/dL — ABNORMAL LOW (ref 8.9–10.3)
Chloride: 103 mmol/L (ref 98–111)
Chloride: 104 mmol/L (ref 98–111)
Creatinine, Ser: 0.73 mg/dL (ref 0.61–1.24)
Creatinine, Ser: 0.61 mg/dL (ref 0.61–1.24)
GFR calc Af Amer: >60 mL/min (ref >60)
GFR calc Af Amer: >60 mL/min (ref >60)
GFR calc non Af Amer: >60 mL/min (ref >60)
GFR calc non Af Amer: >60 mL/min (ref >60)
Glucose, Bld: 104 mg/dL — ABNORMAL HIGH (ref 70–99)
Glucose, Bld: 105 mg/dL — ABNORMAL HIGH (ref 70–99)
Phosphorus: 1.6 mg/dL — ABNORMAL LOW (ref 2.5–4.6)
Phosphorus: 1.8 mg/dL — ABNORMAL LOW (ref 2.5–4.6)
Potassium: 3.9 mmol/L (ref 3.5–5.1)
Potassium: 4.1 mmol/L (ref 3.5–5.1)
Sodium: 135 mmol/L (ref 135–145)
Sodium: 137 mmol/L (ref 135–145)

## 2019-08-21 LAB — DIGOXIN LEVEL: Digoxin Level: 3.2 ng/mL (ref 0.8–2.0)

## 2019-08-21 LAB — HEPARIN LEVEL (UNFRACTIONATED)
Heparin Unfractionated: 1.62 IU/mL — ABNORMAL HIGH (ref 0.30–0.70)
Heparin Unfractionated: 0.27 IU/mL — ABNORMAL LOW (ref 0.30–0.70)
Heparin Unfractionated: 0.44 IU/mL (ref 0.30–0.70)

## 2019-08-21 LAB — MAGNESIUM: Magnesium: 2.3 mg/dL (ref 1.7–2.4)

## 2019-08-21 MED ORDER — PANTOPRAZOLE SODIUM 40 MG PO PACK
40.0000 mg | PACK | Freq: Every day | ORAL | Status: DC
  Administered 2019-08-21 – 2019-08-24 (×4): 40 mg
  Filled 2019-08-21 (×7): qty 20

## 2019-08-21 MED ORDER — NEPRO/CARBSTEADY PO LIQD
1000.0000 mL | ORAL | Status: DC
  Administered 2019-08-21 – 2019-08-23 (×2): 1000 mL via ORAL
  Filled 2019-08-21 (×2): qty 1000

## 2019-08-21 MED ORDER — SODIUM PHOSPHATES 45 MMOLE/15ML IV SOLN
20.0000 mmol | Freq: Once | INTRAVENOUS | Status: AC
  Administered 2019-08-21: 20 mmol via INTRAVENOUS
  Filled 2019-08-21: qty 6.67

## 2019-08-21 MED ORDER — ALBUTEROL SULFATE (2.5 MG/3ML) 0.083% IN NEBU
2.5000 mg | INHALATION_SOLUTION | Freq: Three times a day (TID) | RESPIRATORY_TRACT | Status: DC
  Administered 2019-08-21 – 2019-09-05 (×46): 2.5 mg via RESPIRATORY_TRACT
  Filled 2019-08-21 (×46): qty 3

## 2019-08-21 MED ORDER — HEPARIN (PORCINE) 25000 UT/250ML-% IV SOLN
2500.0000 [IU]/h | INTRAVENOUS | Status: DC
  Administered 2019-08-22 – 2019-08-23 (×3): 1750 [IU]/h via INTRAVENOUS
  Administered 2019-08-24 (×2): 2100 [IU]/h via INTRAVENOUS
  Administered 2019-08-25: 2250 [IU]/h via INTRAVENOUS
  Administered 2019-08-25: 2500 [IU]/h via INTRAVENOUS
  Filled 2019-08-21 (×7): qty 250

## 2019-08-21 MED ORDER — SODIUM CHLORIDE 0.9% IV SOLUTION: Freq: Once | INTRAVENOUS | Status: AC

## 2019-08-21 MED ORDER — METHOCARBAMOL 500 MG PO TABS
500.0000 mg | ORAL_TABLET | Freq: Three times a day (TID) | ORAL | Status: DC
  Administered 2019-08-22 – 2019-08-24 (×9): 500 mg
  Filled 2019-08-21 (×10): qty 1

## 2019-08-21 NOTE — Progress Notes (Signed)
[facility] Progress Note Patient Name: Lance Cook DOB: 30-Mar-1961 MRN: [id number]   Date of Service  08/21/2019  HPI/Events of Note  Coagulopathy - INR = 6.5.   eICU Interventions  Will order: 1. Transfuse 3 units FFP. 2. Repeat INR at 12 noon.      Intervention Category Intermediate Interventions: Coagulopathy - evaluation and management  Elli Groesbeck [name] 08/21/2019, 6:10 AM

## 2019-08-21 NOTE — Progress Notes (Signed)
CRITICAL VALUE ALERT  Critical Value:  INR  Date & Time Notied:  08/21/2019  06:02  Provider Notified: E-link RN and Pharmacy   Orders Received/Actions taken: Waiting for orders

## 2019-08-21 NOTE — Progress Notes (Signed)
[facility] NEPHROLOGY PROGRESS NOTE  Assessment/ Plan: Pt is a 58 y.o. yo male CHF, mechanical mitral and aortic valve A. fib, VT arrest, DM, potassium of 8.4 associated with hypotension and AKI on admission.  #Severe hyperkalemia with EKG changes due to use of spironolactone, KCl and AKI: Received urgent dialysis on admission, started CRRT on 12/18 for fluid overload.   Potassium level improved.  #Acute on CKD stage III: Baseline creatinine around 1.3-1.6, likely ischemic ATN due to shock.  Nonoliguric.  Monitor BMP.  CRRT as above for fluid management.   INR is elevated.  Discontinue IV heparin.  Continue 4K bath and  UF 100 to 150 cc an hour as tolerated by BP.  #Acute on chronic hyponatremia: Probably due to diuretics.  He was on metolazone, torsemide and Aldactone at home.  Sodium level gradually improved.  #Acute on Chronic CHF, aortic and mitral valve replacement: Per cardiology.  UF with CRRT.  #Hypotension/shock: Blood pressure is improving.  On Levophed.  #Acute respiratory failure: Intubated, per PCCM.  Discussed with ICU team.  Subjective: Seen and examined at bedside.  Tolerating CRRT well.  Remains intubated however alert awake and following commands.  Urine output 650 cc and UF 5124 cc.  Objective Vital signs in last 24 hours: Vitals:   08/21/19 0815 08/21/19 0820 08/21/19 0830 08/21/19 0835  BP:  (!) 112/48    Pulse: (!) 58 63 (!) 59 (!) 59  Resp: (!) 25 (!) 25 (!) 22 (!) 25  Temp:  (!) 96 F (35.6 C)  (!) 96 F (35.6 C)  TempSrc:  Axillary  Axillary  SpO2: 100% 100% 99% 100%  Weight:      Height:       Weight change: 1.2 kg  Intake/Output Summary (Last 24 hours) at 08/21/2019 0903 Last data filed at 08/21/2019 0800 Gross per 24 hour  Intake 2629.47 ml  Output 5708 ml  Net -3078.53 ml       Labs: Basic Metabolic Panel: Recent Labs  Lab 08/20/19 0341 08/20/19 1519 08/21/19 0436  NA 131* 134* 135  K 4.5 4.5 3.9  CL 96* 101 103   CO2 26 26 25   GLUCOSE 135* 115* 104*  BUN 34* 23* 19  CREATININE 1.00 0.87 0.73  CALCIUM 8.3* 8.1* 7.5*  PHOS 3.3 2.3* 1.6*   Liver Function Tests: Recent Labs  Lab 08/24/2019 0048 09/01/2019 0401 08/20/19 0341 08/20/19 1519 08/21/19 0436  AST 34 25  --   --   --   ALT 16 15  --   --   --   ALKPHOS 54 59  --   --   --   BILITOT 0.5 1.0  --   --   --   PROT 5.3* 5.4*  --   --   --   ALBUMIN 2.3* 2.4* 2.3* 2.1* 1.8*   No results for input(s): LIPASE, AMYLASE in the last 168 hours. No results for input(s): AMMONIA in the last 168 hours. CBC: Recent Labs  Lab 08/08/2019 1424 08/25/2019 0048 08/11/2019 2220 08/20/19 0129 08/21/19 0436  WBC 19.1* 25.3*  --   --  9.4  NEUTROABS 17.0*  --   --   --   --   HGB 9.6*  11.2* 11.0* 10.9* 10.9* 8.3*  HCT 32.7*  33.0* 36.8* 32.0* 32.0* 29.4*  MCV 71.1* 70.5*  --   --  74.6*  PLT 233 221  --   --  163   Cardiac Enzymes: No results for  input(s): CKTOTAL, CKMB, CKMBINDEX, TROPONINI in the last 168 hours. CBG: Recent Labs  Lab 08/20/19 1532 08/20/19 2004 08/21/19 0003 08/21/19 0455 08/21/19 0742  GLUCAP 103* 109* 96 104* 98    Iron Studies: No results for input(s): IRON, TIBC, TRANSFERRIN, FERRITIN in the last 72 hours. Studies/Results: DG Abd 1 View  Result Date: 08/20/2019 CLINICAL DATA:  Enteric tube placement. EXAM: ABDOMEN - 1 VIEW COMPARISON:  None. FINDINGS: Enteric tube tip near the pylorus and possibly within the first portion of the duodenum. The bowel gas pattern is normal. Small amount of oral contrast in the gastric fundus. No radio-opaque calculi or other significant radiographic abnormality are seen. Prior cholecystectomy. No acute osseous abnormality. IMPRESSION: 1. Enteric tube tip near the pylorus/first portion of the duodenum. Electronically Signed   By: Titus Dubin M.D.   On: 08/20/2019 12:20   DG CHEST PORT 1 VIEW  Result Date: 08/20/2019 CLINICAL DATA:  Intubation EXAM: PORTABLE CHEST 1 VIEW COMPARISON:   08/03/2019 FINDINGS: The endotracheal tube terminates above the carina by approximately 2.7 cm. There is a left-sided central venous catheter that terminates over the SVC. There is a left-sided pacemaker/ICD in place. There is a line coursing along the patient's medial right chest which is favored to be external to the patient sign. There appear to be a few pockets of gas in the low right neck. There are worsening pulmonary opacities bilaterally. The heart size is enlarged. Patient is status post prior median sternotomy. The lung volumes are low. IMPRESSION: 1. Lines and tubes as above. 2. There is a line coursing over the patient's medial right chest wall which is favored to be external to the patient. 3. Pockets of subcutaneous gas in the low right neck, likely related to recent surgical intervention for removal of the patient's malpositioned right central venous catheter. 4. Worsening pulmonary opacities bilaterally which may represent developing pulmonary edema or an infectious process. 5. Cardiomegaly. Low lung volumes with probable small bilateral pleural effusions. 6. No pneumothorax. Electronically Signed   By: Constance Holster M.D.   On: 08/20/2019 01:03    Medications: Infusions: .  prismasol BGK 4/2.5 500 mL/hr at 08/21/19 0112  .  prismasol BGK 4/2.5 300 mL/hr at 08/21/19 0306  . sodium chloride 10 mL/hr at 08/21/19 0800  . amiodarone 30 mg/hr (08/21/19 0849)  . dexmedetomidine (PRECEDEX) IV infusion 1 mcg/kg/hr (08/21/19 0800)  . fentaNYL infusion INTRAVENOUS Stopped (08/21/19 0758)  . heparin 1,650 Units/hr (08/21/19 0800)  . norepinephrine (LEVOPHED) Adult infusion 4 mcg/min (08/21/19 0758)  . prismasol BGK 4/2.5 1,500 mL/hr at 08/21/19 1157    Scheduled Medications: . albuterol  2.5 mg Nebulization TID  . amiodarone  150 mg Intravenous Once  . chlorhexidine gluconate (MEDLINE KIT)  15 mL Mouth Rinse BID  . Chlorhexidine Gluconate Cloth  6 each Topical Q0600  . insulin aspart   2-6 Units Subcutaneous Q4H  . mouth rinse  15 mL Mouth Rinse 10 times per day  . methocarbamol  500 mg Oral TID  . pantoprazole  40 mg Oral Daily  . polyethylene glycol  17 g Oral Daily    have reviewed scheduled and prn medications.  Physical Exam: General: Intubated, sedated, following commands Heart:RRR, s1s2 nl Lungs: Coarse breath sound bilateral Abdomen:soft,  non-distended Extremities: LE edema + Dialysis Access: Left IJ temporary catheter.  Bernese Doffing Prasad Haedyn Breau 08/21/2019,9:03 AM  LOS: 3 days  Pager: [phone]

## 2019-08-21 NOTE — Progress Notes (Signed)
ANTICOAGULATION CONSULT NOTE   Pharmacy Consult for Heparin Indication: Hx St Jude MVR/AVR + Afib  Patient Measurements: Height: 5' (152.4 cm) Weight: 195 lb 1.7 oz (88.5 kg) IBW/kg (Calculated) : 50 Heparin Dosing Weight: 70 kg  Vital Signs: Temp: 97.6 F (36.4 C) (12/20 1941) Temp Source: Oral (12/20 1941) BP: 113/63 (12/20 2300) Pulse Rate: 73 (12/20 2300)  Labs: Recent Labs    08/16/2019 0048 08/17/2019 0401 08/13/2019 2220 08/29/2019 2220 08/20/19 0129 08/20/19 0144 08/20/19 1519 08/21/19 0436 08/21/19 0615 08/21/19 0727 08/21/19 1152 08/21/19 1510 08/21/19 2157  HGB 11.0*   < > 10.9*  --  10.9*  --   --  8.3*  --   --   --   --   --   HCT 36.8*   < > 32.0*  --  32.0*  --   --  29.4*  --   --   --   --   --   PLT 221  --   --   --   --   --   --  163  --   --   --   --   --   LABPROT  --    < >  --   --   --   --   --  57.4* 51.7*  --  25.7*  --   --   INR  --    < >  --   --   --   --   --  6.5* 5.7*  --  2.4*  --   --   HEPARINUNFRC  --   --   --    < >  --   --   --  1.62*  --  0.27*  --   --  0.44  CREATININE 2.05*  --   --   --   --    < > 0.87 0.73  --   --   --  0.61  --    < > = values in this interval not displayed.    Estimated Creatinine Clearance: 93.1 mL/min (by C-G formula based on SCr of 0.61 mg/dL).   Medications:  Infusions:  .  prismasol BGK 4/2.5 500 mL/hr at 08/21/19 2241  .  prismasol BGK 4/2.5 300 mL/hr at 08/21/19 1148  . sodium chloride 10 mL/hr at 08/21/19 2300  . amiodarone 30 mg/hr (08/21/19 2300)  . dexmedetomidine (PRECEDEX) IV infusion 1.2 mcg/kg/hr (08/21/19 2300)  . feeding supplement (NEPRO CARB STEADY) 30 mL/hr at 08/21/19 2300  . fentaNYL infusion INTRAVENOUS 250 mcg/hr (08/21/19 2300)  . heparin 1,750 Units/hr (08/21/19 2300)  . norepinephrine (LEVOPHED) Adult infusion 2 mcg/min (08/21/19 1836)  . prismasol BGK 4/2.5 1,500 mL/hr at 08/21/19 2244    Assessment: 24 YOM on warfarin PTA for hx St Jude MVR + Afib - admitted  with AKI and hyperkalemia requiring line replacement and start of CRRT. FFP given and Heparin bridge started when INR<2.5. Pharmacy consulted to dose.   The patient had an INR drawn this morning which was elevated at 6.5 - unclear why elevated, FFP x 3 given and down to 2.4. Discussed with both cardiology and CCM - will continue the heparin bridge and d/c further INR checks for now.  Heparin level this morning was SUBtherapeutic (HL 0.27) - adjustments in the rate were delayed pending the patient's recheck INR at noon. The order for heparin was discontinued but the drip was never turned off as the RN was  instructed to continue. Will increase the rate slightly and recheck this evening. RN reports some blood tinged urine - will monitor.   12/20 PM update:  Heparin level therapeutic   Goal of Therapy:  Heparin level 0.3-0.7 units/ml Monitor platelets by anticoagulation protocol: Yes   Plan:  -Cont heparin at 1750 units/hr -Confirmatory heparin level with AM labs  Narda Bonds, PharmD, [place] Pharmacist Phone: [phone]

## 2019-08-21 NOTE — Progress Notes (Signed)
ANTICOAGULATION CONSULT NOTE - Follow Up Consult  Pharmacy Consult for Heparin Indication: Hx St Jude MVR/AVR + Afib  Patient Measurements: Height: 5' (152.4 cm) Weight: 195 lb 1.7 oz (88.5 kg) IBW/kg (Calculated) : 50 Heparin Dosing Weight: 70 kg  Vital Signs: Temp: 96 F (35.6 C) (12/20 0820) Temp Source: Axillary (12/20 0820) BP: 112/48 (12/20 0820) Pulse Rate: 59 (12/20 0830)  Labs: Recent Labs    08/17/2019 1424 08/28/2019 1424 08/13/2019 1624 08/07/2019 0048 08/06/2019 0401 08/18/2019 1234 08/23/2019 1556 08/29/2019 2220 08/14/2019 2220 08/20/19 0129 08/20/19 0341 08/20/19 1519 08/20/19 1957 08/21/19 0436 08/21/19 0615 08/21/19 0727  HGB 9.6*  11.2*   < >  --  11.0*   < >  --    < > 10.9*  --  10.9*  --   --   --  8.3*  --   --   HCT 32.7*  33.0*   < >  --  36.8*   < >  --    < > 32.0*  --  32.0*  --   --   --  29.4*  --   --   PLT 233  --   --  221  --   --   --   --   --   --   --   --   --  163  --   --   LABPROT  --   --  48.3*  --    < > 24.7*  --   --   --   --   --   --   --  57.4* 51.7*  --   INR  --   --  5.3*  --    < > 2.2*  --   --   --   --   --   --   --  6.5* 5.7*  --   HEPARINUNFRC  --   --   --   --   --   --   --   --    < >  --   --   --  <0.10* 1.62*  --  0.27*  CREATININE 2.70*  --  2.54* 2.05*  --   --   --   --   --   --  1.00 0.87  --  0.73  --   --   TROPONINIHS  --   --  33*  --   --   --   --   --   --   --   --   --   --   --   --   --    < > = values in this interval not displayed.    Estimated Creatinine Clearance: 93.1 mL/min (by C-G formula based on SCr of 0.73 mg/dL).   Medications:  Infusions:  .  prismasol BGK 4/2.5 500 mL/hr at 08/21/19 0112  .  prismasol BGK 4/2.5 300 mL/hr at 08/21/19 0306  . sodium chloride 10 mL/hr at 08/21/19 0800  . amiodarone 30 mg/hr (08/21/19 0800)  . dexmedetomidine (PRECEDEX) IV infusion 1 mcg/kg/hr (08/21/19 0800)  . fentaNYL infusion INTRAVENOUS Stopped (08/21/19 0758)  . heparin 1,650 Units/hr  (08/21/19 0800)  . norepinephrine (LEVOPHED) Adult infusion 4 mcg/min (08/21/19 0758)  . prismasol BGK 4/2.5 1,500 mL/hr at 08/21/19 [id number]    Assessment: 23 YOM on warfarin PTA for hx St Jude MVR + Afib - admitted with AKI and hyperkalemia requiring line replacement and start  of CRRT. FFP given and Heparin bridge started when INR<2.5. Pharmacy consulted to dose.   The patient had an INR drawn this morning which was elevated at 6.5 - unclear why elevated, FFP x 3 given and down to 2.4. Discussed with both cardiology and CCM - will continue the heparin bridge and d/c further INR checks for now.  Heparin level this morning was SUBtherapeutic (HL 0.27) - adjustments in the rate were delayed pending the patient's recheck INR at noon. The order for heparin was discontinued but the drip was never turned off as the RN was instructed to continue. Will increase the rate slightly and recheck this evening. RN reports some blood tinged urine - will monitor.   Goal of Therapy:  Heparin level 0.3-0.7 units/ml Monitor platelets by anticoagulation protocol: Yes   Plan:  - Increase Heparin to 1750 units/hr (17.5 ml/hr) - Will monitor slight hematuria reported by RN - Will continue to monitor for any signs/symptoms of bleeding and will follow up with heparin level in 8 hours   Alycia Rossetti, PharmD, BCPS Clinical Pharmacist Clinical phone for 08/21/2019: [phone] 08/21/2019 8:35 AM   **Pharmacist phone directory can now be found on [email or web address] ([email or web address]).  Listed under [facility].

## 2019-08-21 NOTE — Progress Notes (Signed)
Advanced Heart Failure Rounding Note   Subjective:    Remains intubated. Failed vent wean due to apnea.   Remains on CVVHD with UF 100-150/hr. 650cc urine output overnight as well   Delirious. Rhythm stable with chronic AF in 80-90s. No significant ventricular ectopy currently.    Objective:   Weight Range:  Vital Signs:   Temp:  [95.9 F (35.5 C)-98.9 F (37.2 C)] 95.9 F (35.5 C) (12/20 1130) Pulse Rate:  [58-92] 69 (12/20 1145) Resp:  [0-26] 24 (12/20 1145) BP: (84-129)/(27-67) 94/45 (12/20 1145) SpO2:  [93 %-100 %] 100 % (12/20 1145) Arterial Line BP: (94-142)/(42-60) 113/48 (12/20 1130) FiO2 (%):  [40 %] 40 % (12/20 1145) Weight:  [88.5 kg] 88.5 kg (12/20 0500)    Weight change: Filed Weights   08/24/2019 0500 08/20/19 0500 08/21/19 0500  Weight: 85 kg 87.3 kg 88.5 kg    Intake/Output:   Intake/Output Summary (Last 24 hours) at 08/21/2019 1227 Last data filed at 08/21/2019 1200 Gross per 24 hour  Intake 3811.55 ml  Output 6132 ml  Net -2320.45 ml     Physical Exam: General:  On vent. Awake. Not following commands HEENT: normal + ETT Neck: supple. R neck wound ok. LIJ trialysis. Carotids 2+ bilat; no bruits. No lymphadenopathy or thryomegaly appreciated. Cor: PMI nondisplaced. irregular rate & rhythm. Mechanical s1, s2 No rubs, gallops or murmurs. Lungs: clear Abdomen: soft, nontender, nondistended. No hepatosplenomegaly. No bruits or masses. Good bowel sounds. Extremities: no cyanosis, clubbing, rash, 1-2+ edema Neuro: On vent. Awake. Not following commands   Telemetry: AF with pacing 80-90s. Personally reviewed    Labs: Basic Metabolic Panel: Recent Labs  Lab 08/20/2019 1624 08/27/2019 0048 08/22/2019 0401 08/08/2019 1556 08/26/2019 2220 08/20/19 0129 08/20/19 0341 08/20/19 1519 08/21/19 0436  NA 119* 123* 125* 127* 128* 129* 131* 134* 135  K >7.5* 6.9* 5.6* 5.0 4.7 4.8 4.5 4.5 3.9  CL 85* 87* 86* 91*  --   --  96* 101 103  CO2 22 25 29 27    --   --  26 26 25   GLUCOSE 81 119* 110* 131*  --   --  135* 115* 104*  BUN 71* 61* 56* 50*  --   --  34* 23* 19  CREATININE 2.54* 2.05* 1.99* 1.65*  --   --  1.00 0.87 0.73  CALCIUM 7.8* 8.3* 8.3* 8.8*  --   --  8.3* 8.1* 7.5*  MG 2.7* 2.6* 2.5*  --   --   --  2.5*  --  2.3  PHOS 5.8*  --   --  4.9*  --   --  3.3 2.3* 1.6*    Liver Function Tests: Recent Labs  Lab 08/22/2019 0048 08/27/2019 0401 08/06/2019 1556 08/20/19 0341 08/20/19 1519 08/21/19 0436  AST 34 25  --   --   --   --   ALT 16 15  --   --   --   --   ALKPHOS 54 59  --   --   --   --   BILITOT 0.5 1.0  --   --   --   --   PROT 5.3* 5.4*  --   --   --   --   ALBUMIN 2.3* 2.4* 2.3* 2.3* 2.1* 1.8*   No results for input(s): LIPASE, AMYLASE in the last 168 hours. No results for input(s): AMMONIA in the last 168 hours.  CBC: Recent Labs  Lab 08/12/2019 1424 08/04/2019 0048  08/06/2019 1643 08/28/2019 1725 08/02/2019 2220 08/20/19 0129 08/21/19 0436  WBC 19.1* 25.3*  --   --   --   --  9.4  NEUTROABS 17.0*  --   --   --   --   --   --   HGB 9.6*  11.2* 11.0* 8.6* 10.2* 10.9* 10.9* 8.3*  HCT 32.7*  33.0* 36.8* 29.5* 30.0* 32.0* 32.0* 29.4*  MCV 71.1* 70.5*  --   --   --   --  74.6*  PLT 233 221  --   --   --   --  163    Cardiac Enzymes: No results for input(s): CKTOTAL, CKMB, CKMBINDEX, TROPONINI in the last 168 hours.  BNP: BNP (last 3 results) Recent Labs    07/19/19 0956 08/02/19 0957 08/23/2019 1424  BNP 298.7* 167.3* 106.1*    ProBNP (last 3 results) No results for input(s): PROBNP in the last 8760 hours.    Other results:  Imaging: DG Abd 1 View  Result Date: 08/20/2019 CLINICAL DATA:  Enteric tube placement. EXAM: ABDOMEN - 1 VIEW COMPARISON:  None. FINDINGS: Enteric tube tip near the pylorus and possibly within the first portion of the duodenum. The bowel gas pattern is normal. Small amount of oral contrast in the gastric fundus. No radio-opaque calculi or other significant radiographic abnormality  are seen. Prior cholecystectomy. No acute osseous abnormality. IMPRESSION: 1. Enteric tube tip near the pylorus/first portion of the duodenum. Electronically Signed   By: Titus Dubin M.D.   On: 08/20/2019 12:20   DG CHEST PORT 1 VIEW  Result Date: 08/20/2019 CLINICAL DATA:  Intubation EXAM: PORTABLE CHEST 1 VIEW COMPARISON:  08/25/2019 FINDINGS: The endotracheal tube terminates above the carina by approximately 2.7 cm. There is a left-sided central venous catheter that terminates over the SVC. There is a left-sided pacemaker/ICD in place. There is a line coursing along the patient's medial right chest which is favored to be external to the patient sign. There appear to be a few pockets of gas in the low right neck. There are worsening pulmonary opacities bilaterally. The heart size is enlarged. Patient is status post prior median sternotomy. The lung volumes are low. IMPRESSION: 1. Lines and tubes as above. 2. There is a line coursing over the patient's medial right chest wall which is favored to be external to the patient. 3. Pockets of subcutaneous gas in the low right neck, likely related to recent surgical intervention for removal of the patient's malpositioned right central venous catheter. 4. Worsening pulmonary opacities bilaterally which may represent developing pulmonary edema or an infectious process. 5. Cardiomegaly. Low lung volumes with probable small bilateral pleural effusions. 6. No pneumothorax. Electronically Signed   By: Constance Holster M.D.   On: 08/20/2019 01:03     Medications:     Scheduled Medications: . albuterol  2.5 mg Nebulization TID  . amiodarone  150 mg Intravenous Once  . chlorhexidine gluconate (MEDLINE KIT)  15 mL Mouth Rinse BID  . Chlorhexidine Gluconate Cloth  6 each Topical Q0600  . insulin aspart  2-6 Units Subcutaneous Q4H  . mouth rinse  15 mL Mouth Rinse 10 times per day  . methocarbamol  500 mg Oral TID  . pantoprazole sodium  40 mg Per Tube Daily   . polyethylene glycol  17 g Oral Daily    Infusions: .  prismasol BGK 4/2.5 500 mL/hr at 08/21/19 0112  .  prismasol BGK 4/2.5 300 mL/hr at 08/21/19 1148  . sodium  chloride 10 mL/hr at 08/21/19 1200  . amiodarone 30 mg/hr (08/21/19 1200)  . dexmedetomidine (PRECEDEX) IV infusion 1 mcg/kg/hr (08/21/19 1200)  . fentaNYL infusion INTRAVENOUS Stopped (08/21/19 0758)  . norepinephrine (LEVOPHED) Adult infusion 3 mcg/min (08/21/19 1205)  . prismasol BGK 4/2.5 1,500 mL/hr at 08/21/19 0851    PRN Medications: sodium chloride, bisacodyl, fentaNYL, fentaNYL (SUBLIMAZE) injection, fentaNYL (SUBLIMAZE) injection, heparin, oxyCODONE, sennosides   Assessment/Plan:   1. AKI on CKD stage 3: Creatinine up to 2.7 at admission with marked hyperkalemia and hyponatremia in setting of use of torsemide, metolazone, and spironolactone at home.  He had urgent HD with improvement.  - Remains on CVVHD pulling 100-150. Non-oliguric - Renal following 2. Acute on chronic systolic CHF/cardiogenic shock: EF 30-35% on last echo.  [employer] CRT-D, LV not on due to diaphragmatic stimulation.  Hypotension in setting of AKI/hyperkalemia. NE down form 12 -> 3   Marked volume overload improving with CVVHD and improving u/o - Co-ox 65%. Continue to wean NE as BP tolerates - Continue CVVH for volume removal.  Baseline weight about 80kg (currently 88.5kg) 3. VT: Patient has [employer] CRT-D device, LV lead off due to diaphragmatic pacing.  Patient has has runs of VT, now on amiodarone gtt.  - VT quiescent - Keep K > 4.0 and Mg > 2.0 4. Digoxin toxicity: Severely elevated digoxin level, has had digibind and HD.  - bradycardia improved - dig level falling. Now 4.1. No need for repeat digibind as long as clinically stable 5. CAD: Last cath in 2016 with patent native coronaries, probably occluded SVG-RCA.  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  - INR now down to 2.4 (was 6.5). Will need  heparin. Discussed dosing with PharmD personally. 7. Atrial fibrillation: Chronic.   - rate controlled 80-90s - heparin as above   CRITICAL CARE Performed by: Glori Bickers  Total critical care time: 35 minutes  Critical care time was exclusive of separately billable procedures and treating other patients.  Critical care was necessary to treat or prevent imminent or life-threatening deterioration.  Critical care was time spent personally by me (independent of midlevel providers or residents) on the following activities: development of treatment plan with patient and/or surrogate as well as nursing, discussions with consultants, evaluation of patient's response to treatment, examination of patient, obtaining history from patient or surrogate, ordering and performing treatments and interventions, ordering and review of laboratory studies, ordering and review of radiographic studies, pulse oximetry and re-evaluation of patient's condition.    Length of Stay: 3   Glori Bickers MD 08/21/2019, 12:27 PM  Advanced Heart Failure Team Pager [phone] (M-F; [place])  Please contact [facility] for night-coverage after hours (4p -7a ) and weekends on [email or web address]

## 2019-08-21 NOTE — Progress Notes (Addendum)
NAME:  Lance Cook, MRN:  [id number], DOB:  1961/06/14, LOS: 3 ADMISSION DATE:  08/03/2019, CONSULTATION DATE:  08/25/2019 REFERRING MD:  Dr Sherry Ruffing, CHIEF COMPLAINT:  Hyperkalemia    Brief History   58 yo male found to have severe life-threatening hyperkalemia, PCCM consulted for admission and need for urgent iHD.  History of present illness   58 year old man, history of Bentall procedure, MVR, atrial fibrillation, CAD with chronic systolic and diastolic CHF.  Came to the ER with fatigue, weakness and hypotension.  He was found to be profoundly bradycardic with severe left bundle branch block and QRS widening on EKG.  Found to be severely hyperkalemic with new acute renal failure.  He is on digoxin at baseline and his digoxin level was 2.6.  He will be admitted emergently to the ICU for hemodialysis. He was given digibind for dig toxicity.   Significant Hospital Events   12/17 Admitted, urgent HD 12/18 had HD catheter in carotid removed in OR. Returned intubated  Consults:  Nephrology Heart Failure  Procedures:  R IJ HD catheter 12/17 >> Misplaced in carotid, removed in OR 12/18  L IJ HD catheter 12/17 >>   Significant Diagnostic Tests:  NM cardiac amyloid scan 10/21 >> equivocal for any evidence amyloidosis  ECHO 12/18 >  1. Left ventricular ejection fraction, by visual estimation, is 30 to 35%. The left ventricle has severely decreased function. Severely increased left ventricular size. There is no left ventricular hypertrophy.  2. Definity contrast agent was given IV to delineate the left ventricular endocardial borders.  3. Global hypokinesis, though function more preserved at base than apex. LV contrast excludes apical thrombus.  4. Global right ventricle has normal systolic function.The right ventricular size is normal. No increase in right ventricular wall thickness.  5. Left atrial size was normal.  6. Right atrial size was normal.  7. The mitral valve has been  repaired/replaced. Mild mitral valve regurgitation. Moderate mitral stenosis.  8. The tricuspid valve is normal in structure. Tricuspid valve regurgitation mild-moderate.  9. 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 10. The aortic valve is normal in structure. Aortic valve regurgitation is trivial by color flow Doppler. Structurally normal aortic valve, with no evidence of sclerosis or stenosis. 11. Aortic valve with trivial perivalvular leak. 12. The pulmonic valve was grossly normal. Pulmonic valve regurgitation is not visualized by color flow Doppler. 13. S/P Bentall. 14. Moderately elevated pulmonary artery systolic pressure. 15. A pacer wire is visualized in the RV and RA. 16. The inferior vena cava is dilated in size with <50% respiratory variability, suggesting right atrial pressure of 15 mmHg. 17. History of mechanical aortic and mitral valves. Leaflets not well visualized, could consider fluroscopy of leaflets, but gradients not suggestive of obstruction.  CXR 12/17 > New left internal jugular central line tip in the SVC above the right atrium. No pneumothorax. No other change.  Micro Data:  COVID 12/17 > negative  Blood cultures 12/17 >  MRSA PCR 12/17 >negative  Urine culture 12/18 >    Antimicrobials:     Interim history/subjective:  Continues on CRRT. No overnight issues.   Objective   Blood pressure (!) 112/48, pulse 69, temperature (!) 95.9 F (35.5 C), resp. rate 13, height 5' (1.524 m), weight 88.5 kg, SpO2 96 %. CVP:  [9 mmHg-12 mmHg] 9 mmHg  Vent Mode: CPAP;PSV FiO2 (%):  [40 %] 40 % Set Rate:  [25 bmp] 25 bmp Vt Set:  [420 mL] 420 mL PEEP:  [  5 cmH20] 5 cmH20 Pressure Support:  [15 cmH20] 15 cmH20 Plateau Pressure:  [14 cmH20-20 cmH20] 18 cmH20   Intake/Output Summary (Last 24 hours) at 08/21/2019 1043 Last data filed at 08/21/2019 1032 Gross per 24 hour  Intake 3480.07 ml  Output 6094 ml  Net -2613.93 ml   Filed Weights   08/03/2019 0500  08/20/19 0500 08/21/19 0500  Weight: 85 kg 87.3 kg 88.5 kg    Examination: General: Chronically ill appearing elderly male, mildly anxious, in NAD HEENT: [place]/AT, MM pink/moist, PERRL,  Neuro: CAM ICU positive. awake CV: s1s2 regular rate and rhythm, no murmur, rubs, or gallops,  PULM:  Posterior expiratory wheeze and faint crackles, no increased work of breathing GI: soft, bowel sounds active in all 4 quadrants, non-tender, non-distended Extremities: warm/dry, lower extremity  edema  Skin: no rashes or lesions  Resolved Hospital Problem list     Assessment & Plan:  Mr. Trilling is a 58 yo gentleman with significant cardiac history including HFrEF, s/p MVR, s/p Bentall procedure who presents with:  Acute hypoxemic respiratory failure - intubated in the OR, ongoing vent management  - failed SBT for apnea. He is delirious.   Acute Encephalopathy - delirium, metabolic, digoxin toxicity, renal failure.  - CAM ICU positive - precedex for sedation, avoid benzos  Digoxin Toxicity - level on 12/18 was 9. Was s/p 2 units digibind, now down to 4.1. will continue to monitor daily. - no need for repeat digibind doses as long as it is coming down and recurrent toxicity symptoms aren't worsening  Severe life-threatening hyperkalemia  -Due to acute on chronic (stage II-III) renal failure.  Etiology of his renal failure likely includes use of diuretics, poor p.o. intake.  He is on potassium supplementation as well. Also digoxin - now on CRRT - nephrology following   Coagulopathy with elevated INR - likely in the setting of coumadin use prior to admission with congestive hepatopathy - would hold further FFP unless active bleeding. Hemostasis seems to have been achieved from his central line malpositioning.   Cardiogenic Shock Chronic systolic and diastolic CHF with CAD Mechanical mitral valve,  Chronic atrial fibrillation AICD/pacer.  -Admitted with severe left bundle branch block,  wide-complex EKG, enlarged T waves, severe bradycardia, -Baseline anticoagulation with coumadin  Plan: Heparin gtt Hold home Aldactone and Lopressor  Continue midodrine  Continue norepinephrine,  CRRT for volume removal Suspect wide complex tachycardia secondary to acute electrolyte abnormalities in the setting of heart disease, also with digoxin toxicity. Per HF note his LV lead is not currently on.  Continue amiodarone  DM type 2 Plan SSI  Best practice:  Diet: tube feeds advance as tolerated.  Pain/Anxiety/Delirium protocol (if indicated): NA VAP protocol (if indicated): ordered DVT prophylaxis: heparin gtt GI prophylaxis: PPI Glucose control: SI per protocol Mobility: BR Code Status: Full Family Communication: discussed with pt 12/17 Disposition: ICU  Labs   CBC: Recent Labs  Lab 08/16/2019 1424 08/09/2019 0048 08/14/2019 1643 08/14/2019 1725 08/17/2019 2220 08/20/19 0129 08/21/19 0436  WBC 19.1* 25.3*  --   --   --   --  9.4  NEUTROABS 17.0*  --   --   --   --   --   --   HGB 9.6*  11.2* 11.0* 8.6* 10.2* 10.9* 10.9* 8.3*  HCT 32.7*  33.0* 36.8* 29.5* 30.0* 32.0* 32.0* 29.4*  MCV 71.1* 70.5*  --   --   --   --  74.6*  PLT 233 221  --   --   --   --  [id number]    Basic Metabolic Panel: Recent Labs  Lab 08/20/2019 1624 08/08/2019 0048 08/02/2019 0401 08/16/2019 1556 08/12/2019 2220 08/20/19 0129 08/20/19 0341 08/20/19 1519 08/21/19 0436  NA 119* 123* 125* 127* 128* 129* 131* 134* 135  K >7.5* 6.9* 5.6* 5.0 4.7 4.8 4.5 4.5 3.9  CL 85* 87* 86* 91*  --   --  96* 101 103  CO2 22 25 29 27   --   --  26 26 25   GLUCOSE 81 119* 110* 131*  --   --  135* 115* 104*  BUN 71* 61* 56* 50*  --   --  34* 23* 19  CREATININE 2.54* 2.05* 1.99* 1.65*  --   --  1.00 0.87 0.73  CALCIUM 7.8* 8.3* 8.3* 8.8*  --   --  8.3* 8.1* 7.5*  MG 2.7* 2.6* 2.5*  --   --   --  2.5*  --  2.3  PHOS 5.8*  --   --  4.9*  --   --  3.3 2.3* 1.6*   GFR: Estimated Creatinine Clearance: 93.1 mL/min (by C-G formula  based on SCr of 0.73 mg/dL). Recent Labs  Lab 08/05/2019 1424 08/17/2019 1624 08/11/2019 0048 08/21/19 0436  WBC 19.1*  --  25.3* 9.4  LATICACIDVEN 1.8 2.0*  --   --     Liver Function Tests: Recent Labs  Lab 08/02/2019 0048 08/30/2019 0401 08/06/2019 1556 08/20/19 0341 08/20/19 1519 08/21/19 0436  AST 34 25  --   --   --   --   ALT 16 15  --   --   --   --   ALKPHOS 54 59  --   --   --   --   BILITOT 0.5 1.0  --   --   --   --   PROT 5.3* 5.4*  --   --   --   --   ALBUMIN 2.3* 2.4* 2.3* 2.3* 2.1* 1.8*   No results for input(s): LIPASE, AMYLASE in the last 168 hours. No results for input(s): AMMONIA in the last 168 hours.  ABG    Component Value Date/Time   PHART 7.358 08/20/2019 0129   PCO2ART 53.7 (H) 08/20/2019 0129   PO2ART 95.0 08/20/2019 0129   HCO3 30.4 (H) 08/20/2019 0129   TCO2 32 08/20/2019 0129   ACIDBASEDEF 1.0 01/26/2015 0425   O2SAT 65.4 08/21/2019 0646     Coagulation Profile: Recent Labs  Lab 08/02/2019 1624 08/18/2019 0556 08/11/2019 1234 08/21/19 0436 08/21/19 0615  INR 5.3* 2.4* 2.2* 6.5* 5.7*    Cardiac Enzymes: No results for input(s): CKTOTAL, CKMB, CKMBINDEX, TROPONINI in the last 168 hours.  HbA1C: Hgb A1c MFr Bld  Date/Time Value Ref Range Status  02/14/2019 06:08 AM 5.2 4.8 - 5.6 % Final    Comment:    (NOTE) Pre diabetes:          5.7%-6.4% Diabetes:              >6.4% Glycemic control for   <7.0% adults with diabetes   01/22/2015 12:43 PM 5.9 (H) 4.8 - 5.6 % Final    Comment:    (NOTE)         Pre-diabetes: 5.7 - 6.4         Diabetes: >6.4         Glycemic control for adults with diabetes: <7.0     CBG: Recent Labs  Lab 08/20/19 1532 08/20/19 2004 08/21/19 0003 08/21/19 0455 08/21/19 [id number]  GLUCAP 103* 109* 96 104* 98    Critical care time: 55   The patient is critically ill with multiple organ systems failure and requires high complexity decision making for assessment and support, frequent evaluation and titration of  therapies, application of advanced monitoring technologies and extensive interpretation of multiple databases.   Critical Care Time devoted to patient care services described in this note is 36 minutes. This time reflects time of care of this [place] . This critical care time does not reflect separately billable procedures or procedure time, teaching time or supervisory time of PA/NP/Med student/Med Resident etc but could involve care discussion time.  [facility] 08/21/2019 10:43 AM  Pager: [phone] After hours pager: [phone]

## 2019-08-22 ENCOUNTER — Encounter: Payer: Self-pay | Admitting: *Deleted

## 2019-08-22 DIAGNOSIS — T460X1S Poisoning by cardiac-stimulant glycosides and drugs of similar action, accidental (unintentional), sequela: Secondary | ICD-10-CM

## 2019-08-22 DIAGNOSIS — Z789 Other specified health status: Secondary | ICD-10-CM

## 2019-08-22 LAB — RENAL FUNCTION PANEL
Albumin: 2.3 g/dL — ABNORMAL LOW (ref 3.5–5.0)
Albumin: 2.2 g/dL — ABNORMAL LOW (ref 3.5–5.0)
Anion gap: 7 (ref 5–15)
Anion gap: 5 (ref 5–15)
BUN: 14 mg/dL (ref 6–20)
BUN: 14 mg/dL (ref 6–20)
CO2: 26 mmol/L (ref 22–32)
CO2: 25 mmol/L (ref 22–32)
Calcium: 8.3 mg/dL — ABNORMAL LOW (ref 8.9–10.3)
Calcium: 7.8 mg/dL — ABNORMAL LOW (ref 8.9–10.3)
Chloride: 103 mmol/L (ref 98–111)
Chloride: 107 mmol/L (ref 98–111)
Creatinine, Ser: 0.85 mg/dL (ref 0.61–1.24)
Creatinine, Ser: 1.01 mg/dL (ref 0.61–1.24)
GFR calc Af Amer: >60 mL/min (ref >60)
GFR calc Af Amer: >60 mL/min (ref >60)
GFR calc non Af Amer: >60 mL/min (ref >60)
GFR calc non Af Amer: >60 mL/min (ref >60)
Glucose, Bld: 107 mg/dL — ABNORMAL HIGH (ref 70–99)
Glucose, Bld: 109 mg/dL — ABNORMAL HIGH (ref 70–99)
Phosphorus: 1.8 mg/dL — ABNORMAL LOW (ref 2.5–4.6)
Phosphorus: 2.5 mg/dL (ref 2.5–4.6)
Potassium: 4 mmol/L (ref 3.5–5.1)
Potassium: 3.9 mmol/L (ref 3.5–5.1)
Sodium: 136 mmol/L (ref 135–145)
Sodium: 137 mmol/L (ref 135–145)

## 2019-08-22 LAB — PREPARE FRESH FROZEN PLASMA
Unit division: 0
Unit division: 0
Unit division: 0

## 2019-08-22 LAB — POCT I-STAT, CHEM 8
BUN: 98 mg/dL — ABNORMAL HIGH (ref 6–20)
Calcium, Ion: 0.93 mmol/L — ABNORMAL LOW (ref 1.15–1.40)
Chloride: 81 mmol/L — ABNORMAL LOW (ref 98–111)
Creatinine, Ser: 2.7 mg/dL — ABNORMAL HIGH (ref 0.61–1.24)
Glucose, Bld: 92 mg/dL (ref 70–99)
HCT: 33 % — ABNORMAL LOW (ref 39.0–52.0)
Hemoglobin: 11.2 g/dL — ABNORMAL LOW (ref 13.0–17.0)
Potassium: 8.4 mmol/L (ref 3.5–5.1)
Sodium: 112 mmol/L — CL (ref 135–145)
TCO2: 31 mmol/L (ref 22–32)

## 2019-08-22 LAB — BPAM FFP
Blood Product Expiration Date: 202012252359
Blood Product Expiration Date: 202012252359
Blood Product Expiration Date: 202012252359
ISSUE DATE / TIME: 202012200812
ISSUE DATE / TIME: 202012200812
ISSUE DATE / TIME: 202012200812
Unit Type and Rh: 1700
Unit Type and Rh: 7300
Unit Type and Rh: 7300

## 2019-08-22 LAB — CBC
HCT: 29.4 % — ABNORMAL LOW (ref 39.0–52.0)
HCT: 30 % — ABNORMAL LOW (ref 39.0–52.0)
Hemoglobin: 8.3 g/dL — ABNORMAL LOW (ref 13.0–17.0)
Hemoglobin: 8.4 g/dL — ABNORMAL LOW (ref 13.0–17.0)
MCH: 21.1 pg — ABNORMAL LOW (ref 26.0–34.0)
MCH: 20.9 pg — ABNORMAL LOW (ref 26.0–34.0)
MCHC: 28.2 g/dL — ABNORMAL LOW (ref 30.0–36.0)
MCHC: 28 g/dL — ABNORMAL LOW (ref 30.0–36.0)
MCV: 74.6 fL — ABNORMAL LOW (ref 80.0–100.0)
MCV: 74.6 fL — ABNORMAL LOW (ref 80.0–100.0)
Platelets: 163 10*3/uL (ref 150–400)
Platelets: 161 10*3/uL (ref 150–400)
RBC: 3.94 MIL/uL — ABNORMAL LOW (ref 4.22–5.81)
RBC: 4.02 MIL/uL — ABNORMAL LOW (ref 4.22–5.81)
RDW: 19.9 % — ABNORMAL HIGH (ref 11.5–15.5)
RDW: 20.5 % — ABNORMAL HIGH (ref 11.5–15.5)
WBC: 9.4 10*3/uL (ref 4.0–10.5)
WBC: 10 10*3/uL (ref 4.0–10.5)
nRBC: 0 % (ref 0.0–0.2)
nRBC: 0 % (ref 0.0–0.2)

## 2019-08-22 LAB — GLUCOSE, CAPILLARY
Glucose-Capillary: 101 mg/dL — ABNORMAL HIGH (ref 70–99)
Glucose-Capillary: 101 mg/dL — ABNORMAL HIGH (ref 70–99)
Glucose-Capillary: 105 mg/dL — ABNORMAL HIGH (ref 70–99)
Glucose-Capillary: 91 mg/dL (ref 70–99)
Glucose-Capillary: 91 mg/dL (ref 70–99)
Glucose-Capillary: 93 mg/dL (ref 70–99)
Glucose-Capillary: 97 mg/dL (ref 70–99)

## 2019-08-22 LAB — DIGOXIN LEVEL: Digoxin Level: 3.2 ng/mL (ref 0.8–2.0)

## 2019-08-22 LAB — COOXEMETRY PANEL
Carboxyhemoglobin: 0.7 % (ref 0.5–1.5)
Methemoglobin: 0.5 % (ref 0.0–1.5)
O2 Saturation: 98 %
Total hemoglobin: 8.5 g/dL — ABNORMAL LOW (ref 12.0–16.0)

## 2019-08-22 LAB — HEPARIN LEVEL (UNFRACTIONATED): Heparin Unfractionated: 0.39 IU/mL (ref 0.30–0.70)

## 2019-08-22 LAB — MAGNESIUM: Magnesium: 2.4 mg/dL (ref 1.7–2.4)

## 2019-08-22 LAB — HEPATITIS B SURFACE ANTIBODY, QUANTITATIVE: Hep B S AB Quant (Post): <3.1 m[IU]/mL — ABNORMAL LOW (ref >9.9)

## 2019-08-22 MED ORDER — HYPROMELLOSE (GONIOSCOPIC) 2.5 % OP SOLN
1.0000 [drp] | OPHTHALMIC | Status: DC | PRN
  Administered 2019-08-22: 21:00:00 1 [drp] via OPHTHALMIC
  Filled 2019-08-22: qty 15

## 2019-08-22 MED ORDER — SODIUM PHOSPHATES 45 MMOLE/15ML IV SOLN
30.0000 mmol | Freq: Once | INTRAVENOUS | Status: AC
  Administered 2019-08-22: 15:00:00 30 mmol via INTRAVENOUS
  Filled 2019-08-22: qty 10

## 2019-08-22 NOTE — Progress Notes (Signed)
   VASCULAR SURGERY ASSESSMENT & PLAN:   POD 3 - S/P REMOVAL OF DIALYSIS CATHETER FROM RIGHT CAROTID ARTERY: His incision is healing nicely.  I removed his drain today.  Vascular surgery will be available as needed.  SUBJECTIVE:   Still on vent but has been weaning.  PHYSICAL EXAM:   Vitals:   08/22/19 0434 08/22/19 0500 08/22/19 0600 08/22/19 0733  BP:  (!) 90/50 90/73   Pulse:  70 77   Resp:  (!) 26 (!) 25   Temp: 97.7 F (36.5 C)   97.8 F (36.6 C)  TempSrc: Oral   Oral  SpO2:  97% 100%   Weight:  77.2 kg    Height:       His right neck incision looks fine. His JP had only 5 cc of drainage and I remove this this morning. NEURO: No focal weakness.  LABS:   Lab Results  Component Value Date   WBC 10.0 08/22/2019   HGB 8.4 (L) 08/22/2019   HCT 30.0 (L) 08/22/2019   MCV 74.6 (L) 08/22/2019   PLT 161 08/22/2019   Lab Results  Component Value Date   CREATININE 0.85 08/22/2019   Lab Results  Component Value Date   INR 2.4 (H) 08/21/2019   CBG (last 3)  Recent Labs    08/22/19 0016 08/22/19 0428 08/22/19 0729  GLUCAP 105* 97 101*    PROBLEM LIST:    Active Problems:   Hyperkalemia   CURRENT MEDS:   . albuterol  2.5 mg Nebulization TID  . amiodarone  150 mg Intravenous Once  . chlorhexidine gluconate (MEDLINE KIT)  15 mL Mouth Rinse BID  . Chlorhexidine Gluconate Cloth  6 each Topical Q0600  . insulin aspart  2-6 Units Subcutaneous Q4H  . mouth rinse  15 mL Mouth Rinse 10 times per day  . methocarbamol  500 mg Per Tube TID  . pantoprazole sodium  40 mg Per Tube Daily  . polyethylene glycol  17 g Oral Daily    Deitra Mayo Office: [phone] 08/22/2019

## 2019-08-22 NOTE — Progress Notes (Signed)
[facility] PROGRESS NOTE  Assessment/ Plan: Pt is a 58 y.o. yo male CHF, mechanical mitral and aortic valve A. fib, VT arrest, DM, potassium of 8.4 associated with hypotension and AKI on admission.  #Severe hyperkalemia with EKG changes due to use of spironolactone, KCl and AKI: Received urgent dialysis on admission, started CRRT on 12/18 for fluid overload.   Potassium level improved.  #Acute on CKD stage III: Baseline creatinine around 1.3-1.6, likely ischemic ATN due to shock.  Continue CRRT for fluid management.  Total UF of around 7 L in 24 hours.  On IV heparin, 4K bath. Monitor urine output, BMP.  #Acute on chronic hyponatremia: Probably due to diuretics.  He was on metolazone, torsemide and Aldactone at home.  Sodium level  improved.  #Acute on Chronic CHF, aortic and mitral valve replacement: Per cardiology.  UF with CRRT.  #Hypotension/shock: On Levophed.  #Acute respiratory failure: Intubated, per PCCM.  Discussed with ICU team.  Subjective: Seen and examined at bedside.  Tolerating CRRT well with good ultrafiltration.  He is mildly sedated however opens eyes with the name.  No new event. Objective Vital signs in last 24 hours: Vitals:   08/22/19 0500 08/22/19 0600 08/22/19 0733 08/22/19 0800  BP: (!) 90/50 90/73  (!) 107/51  Pulse: 70 77  87  Resp: (!) 26 (!) 25  (!) 32  Temp:   97.8 F (36.6 C)   TempSrc:   Oral   SpO2: 97% 100%  100%  Weight: 77.2 kg     Height:       Weight change: -11.3 kg  Intake/Output Summary (Last 24 hours) at 08/22/2019 0940 Last data filed at 08/22/2019 0700 Gross per 24 hour  Intake 3534.69 ml  Output 6819 ml  Net -3284.31 ml       Labs: Basic Metabolic Panel: Recent Labs  Lab 08/21/19 0436 08/21/19 1510 08/22/19 0538  NA 135 137 136  K 3.9 4.1 4.0  CL 103 104 103  CO2 _0 GLUCOSE 104* 105* 107*  BUN _1 CREATININE 0.73 0.61 0.85  CALCIUM 7.5* 8.1* 8.3*  PHOS 1.6* 1.8* 1.8*    Liver Function Tests: Recent Labs  Lab 08/27/2019 0048 08/23/2019 0401 08/21/19 0436 08/21/19 1510 08/22/19 0538  AST 34 25  --   --   --   ALT 16 15  --   --   --   ALKPHOS 54 59  --   --   --   BILITOT 0.5 1.0  --   --   --   PROT 5.3* 5.4*  --   --   --   ALBUMIN 2.3* 2.4* 1.8* 2.2* 2.3*   No results for input(s): LIPASE, AMYLASE in the last 168 hours. No results for input(s): AMMONIA in the last 168 hours. CBC: Recent Labs  Lab 08/29/2019 1424 08/10/2019 0048 08/20/19 0129 08/21/19 0436 08/22/19 0538  WBC 19.1* 25.3*  --  9.4 10.0  NEUTROABS 17.0*  --   --   --   --   HGB 9.6*  11.2* 11.0* 10.9* 8.3* 8.4*  HCT 32.7*  33.0* 36.8* 32.0* 29.4* 30.0*  MCV 71.1* 70.5*  --  74.6* 74.6*  PLT 233 221  --  163 161   Cardiac Enzymes: No results for input(s): CKTOTAL, CKMB, CKMBINDEX, TROPONINI in the last 168 hours. CBG: Recent Labs  Lab 08/21/19 1526 08/21/19 1954 08/22/19 0016 08/22/19 0428 08/22/19 0729  GLUCAP 97 99 105* 97  101*    Iron Studies: No results for input(s): IRON, TIBC, TRANSFERRIN, FERRITIN in the last 72 hours. Studies/Results: DG Abd 1 View  Result Date: 08/20/2019 CLINICAL DATA:  Enteric tube placement. EXAM: ABDOMEN - 1 VIEW COMPARISON:  None. FINDINGS: Enteric tube tip near the pylorus and possibly within the first portion of the duodenum. The bowel gas pattern is normal. Small amount of oral contrast in the gastric fundus. No radio-opaque calculi or other significant radiographic abnormality are seen. Prior cholecystectomy. No acute osseous abnormality. IMPRESSION: 1. Enteric tube tip near the pylorus/first portion of the duodenum. Electronically Signed   By: Titus Dubin M.D.   On: 08/20/2019 12:20    Medications: Infusions: .  prismasol BGK 4/2.5 500 mL/hr at 08/22/19 0851  .  prismasol BGK 4/2.5 300 mL/hr at 08/21/19 1148  . sodium chloride 10 mL/hr at 08/22/19 0700  . amiodarone 30 mg/hr (08/22/19 0700)  . dexmedetomidine (PRECEDEX) IV  infusion 0.8 mcg/kg/hr (08/22/19 0700)  . feeding supplement (NEPRO CARB STEADY) 30 mL/hr at 08/22/19 0300  . fentaNYL infusion INTRAVENOUS 150 mcg/hr (08/22/19 0700)  . heparin 1,750 Units/hr (08/22/19 0124)  . norepinephrine (LEVOPHED) Adult infusion 2 mcg/min (08/21/19 1836)  . prismasol BGK 4/2.5 1,500 mL/hr at 08/22/19 1518    Scheduled Medications: . albuterol  2.5 mg Nebulization TID  . amiodarone  150 mg Intravenous Once  . chlorhexidine gluconate (MEDLINE KIT)  15 mL Mouth Rinse BID  . Chlorhexidine Gluconate Cloth  6 each Topical Q0600  . insulin aspart  2-6 Units Subcutaneous Q4H  . mouth rinse  15 mL Mouth Rinse 10 times per day  . methocarbamol  500 mg Per Tube TID  . pantoprazole sodium  40 mg Per Tube Daily  . polyethylene glycol  17 g Oral Daily    have reviewed scheduled and prn medications.  Physical Exam: General: Intubated, sedated, opening eyes with the name Heart:RRR, s1s2 nl Lungs: Coarse breath sound bilateral Abdomen:soft,  non-distended Extremities: LE edema + Dialysis Access: Left IJ temporary catheter.  Leotis Isham Prasad Suraya Vidrine 08/22/2019,9:40 AM  LOS: 4 days  Pager: [phone]

## 2019-08-22 NOTE — Progress Notes (Signed)
ANTICOAGULATION CONSULT NOTE - Follow Up Consult  Pharmacy Consult for Heparin Indication: Hx St Jude MVR/AVR + Afib  Patient Measurements: Height: 5' (152.4 cm) Weight: 170 lb 3.1 oz (77.2 kg) IBW/kg (Calculated) : 50 Heparin Dosing Weight: 70 kg  Vital Signs: Temp: 97.8 F (36.6 C) (12/21 0733) Temp Source: Oral (12/21 0733) BP: 76/53 (12/21 1100) Pulse Rate: 92 (12/21 1100)  Labs: Recent Labs    08/20/19 0129 08/20/19 0144 08/21/19 0436 08/21/19 0615 08/21/19 0727 08/21/19 1152 08/21/19 1510 08/21/19 2157 08/22/19 0538  HGB 10.9*  --  8.3*  --   --   --   --   --  8.4*  HCT 32.0*  --  29.4*  --   --   --   --   --  30.0*  PLT  --   --  163  --   --   --   --   --  161  LABPROT  --   --  57.4* 51.7*  --  25.7*  --   --   --   INR  --   --  6.5* 5.7*  --  2.4*  --   --   --   HEPARINUNFRC  --   --  1.62*  --  0.27*  --   --  0.44 0.39  CREATININE  --    < > 0.73  --   --   --  0.61  --  0.85   < > = values in this interval not displayed.    Estimated Creatinine Clearance: 81.6 mL/min (by C-G formula based on SCr of 0.85 mg/dL).  Assessment: 89 YOM on warfarin PTA for hx St Jude MVR + Afib - admitted with AKI and hyperkalemia requiring line replacement and start of CRRT. FFP given and Heparin bridge started when INR<2.5. Pharmacy consulted to dose.   Hep lvl therapeutic this am  Cbc stable; bleeding has resolved per RN  Goal of Therapy:  Heparin level 0.3-0.7 units/ml Monitor platelets by anticoagulation protocol: Yes   Plan:  Continue heparin 1750 units/hr Daily hep lvl cbc  Barth Kirks, PharmD, BCPS, BCCCP Clinical Pharmacist [phone]  Please check AMION for all [place] numbers  08/22/2019 11:16 AM

## 2019-08-22 NOTE — Progress Notes (Addendum)
NAME:  Lance Cook, MRN:  [id number], DOB:  Jun 22, 1961, LOS: 4 ADMISSION DATE:  08/04/2019, CONSULTATION DATE:  08/29/2019 REFERRING MD:  Dr Sherry Ruffing, CHIEF COMPLAINT:  Hyperkalemia    Brief History   58 yo male found to have severe life-threatening hyperkalemia, PCCM consulted for admission and need for urgent iHD.  History of present illness   58 year old man, history of Bentall procedure, MVR, atrial fibrillation, CAD with chronic systolic and diastolic CHF.  Came to the ER with fatigue, weakness and hypotension.  He was found to be profoundly bradycardic with severe left bundle branch block and QRS widening on EKG.  Found to be severely hyperkalemic with new acute renal failure.  He is on digoxin at baseline and his digoxin level was 2.6.  He will be admitted emergently to the ICU for hemodialysis. He was given digibind for dig toxicity.   Significant Hospital Events   12/17 Admitted, urgent HD 12/18 had HD catheter in carotid removed in OR. Returned intubated  Consults:  Nephrology Heart Failure  Procedures:  R IJ HD catheter 12/17 >> Misplaced in carotid, removed in OR 12/18  L IJ HD catheter 12/17 >>   Significant Diagnostic Tests:  NM cardiac amyloid scan 10/21 >> equivocal for any evidence amyloidosis  ECHO 12/18 >  1. Left ventricular ejection fraction, by visual estimation, is 30 to 35%. The left ventricle has severely decreased function. Severely increased left ventricular size. There is no left ventricular hypertrophy.  2. Definity contrast agent was given IV to delineate the left ventricular endocardial borders.  3. Global hypokinesis, though function more preserved at base than apex. LV contrast excludes apical thrombus.  4. Global right ventricle has normal systolic function.The right ventricular size is normal. No increase in right ventricular wall thickness.  5. Left atrial size was normal.  6. Right atrial size was normal.  7. The mitral valve has been  repaired/replaced. Mild mitral valve regurgitation. Moderate mitral stenosis.  8. The tricuspid valve is normal in structure. Tricuspid valve regurgitation mild-moderate.  9. 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 10. The aortic valve is normal in structure. Aortic valve regurgitation is trivial by color flow Doppler. Structurally normal aortic valve, with no evidence of sclerosis or stenosis. 11. Aortic valve with trivial perivalvular leak. 12. The pulmonic valve was grossly normal. Pulmonic valve regurgitation is not visualized by color flow Doppler. 13. S/P Bentall. 14. Moderately elevated pulmonary artery systolic pressure. 15. A pacer wire is visualized in the RV and RA. 16. The inferior vena cava is dilated in size with <50% respiratory variability, suggesting right atrial pressure of 15 mmHg. 17. History of mechanical aortic and mitral valves. Leaflets not well visualized, could consider fluroscopy of leaflets, but gradients not suggestive of obstruction.  CXR 12/17 > New left internal jugular central line tip in the SVC above the right atrium. No pneumothorax. No other change.  Micro Data:  COVID 12/17 > negative  Blood cultures 12/17 >  MRSA PCR 12/17 >negative  Urine culture 12/18 >  Negative  Antimicrobials:    Interim history/subjective:  Remains confused, No reports issues overnight. Remains on CRRT  Objective   Blood pressure (!) 107/51, pulse 87, temperature 97.8 F (36.6 C), temperature source Oral, resp. rate (!) 32, height 5' (1.524 m), weight 77.2 kg, SpO2 100 %. CVP:  [11 mmHg] 11 mmHg  Vent Mode: PRVC FiO2 (%):  [40 %] 40 % Set Rate:  [25 bmp] 25 bmp Vt Set:  [420 mL] 420  mL PEEP:  [5 cmH20] 5 cmH20 Pressure Support:  [8 cmH20-15 cmH20] 8 cmH20 Plateau Pressure:  [16 cmH20-20 cmH20] 18 cmH20   Intake/Output Summary (Last 24 hours) at 08/22/2019 0847 Last data filed at 08/22/2019 0700 Gross per 24 hour  Intake 3949.99 ml  Output 7117 ml    Net -3167.01 ml   Filed Weights   08/20/19 0500 08/21/19 0500 08/22/19 0500  Weight: 87.3 kg 88.5 kg 77.2 kg    Examination: General: Chronically ill appearing thin elderly male on mechanical ventilation, in NAD HEENT: ETT, MM pink/moist, PERRL,  Neuro: Eyes open on vent but appears confused with inability to follow commands, CAM-ICU positive  CV: s1s2 regular rate and rhythm, no murmur, rubs, or gallops,  PULM:  Snoring like respirations on auscultation, no increased work of breathing, faint crackles to bilateral bases  GI: soft, bowel sounds active in all 4 quadrants, non-tender, non-distended, tolerating TF Extremities: warm/dry, 1+ lower extremity  edema  Skin: no rashes or lesions  Resolved Hospital Problem list   Coagulopathy with elevated INR  Assessment & Plan:  [name] is a 58 yo gentleman with significant cardiac history including HFrEF, s/p MVR, s/p Bentall procedure who presents with:  Acute hypoxemic respiratory failure - intubated in the OR - failed SBT for apnea. He is delirious.  Plan: Continue ventilator support with lung protective strategies  Wean PEEP and FiO2 for sats greater than 90%. Head of bed elevated 30 degrees. Plateau pressures less than 30 cm H20.  Follow intermittent chest x-ray and ABG.   SAT/SBT as tolerated, mentation preclude extubation  Ensure adequate pulmonary hygiene  Follow cultures  VAP bundle in place  PAD protocol  Acute Encephalopathy - In the setting of delirium, metabolic, digoxin toxicity, renal failure.  - CAM ICU remains positive Plan: Precedex for agitation  Minimize sedation as able  Delirium precautions   Digoxin Toxicity - level on 12/18 was 9. Was s/p 2 units digibind, now down to 4.1 Plan: Continue to monitor Dig levels daily  No indications to repeat digibind unless signs of toxicity seen  Re-dose digibind as needed  Will be unable to utilize dig in the future   AKI superimposed on CKD stage 3 Severe  life-threatening hyperkalemia  -Due to acute on chronic (stage II-III) renal failure.  Etiology of his renal failure likely includes use of diuretics, poor p.o. intake.  He is on potassium supplementation as well. Also digoxin Plan Nephrology following Continue CRRT  Trend Bmet  Follow renal function / urine output Trend Bmet Avoid nephrotoxins, ensure adequate renal perfusion   Cardiogenic Shock Acute on Chronic systolic and diastolic CHF with CAD Mechanical mitral valve,  Chronic atrial fibrillation AICD/pacer.  -Admitted with severe left bundle branch block, wide-complex EKG, enlarged T waves, severe bradycardia, -Baseline anticoagulation with coumadin  -Suspect wide complex tachycardia secondary to acute electrolyte abnormalities in the setting of heart disease, also with digoxin toxicity. Per HF note his LV lead is not currently on.  Plan: Heart failure following, appreciate assistance  Continue Heparin drip  CRRT for significant volume overload  Hold home Aldactone and Lopressor  Continue to wean pressor support as able  Continue IV amiodarone and Levophed   DM type 2 Plan SSI  Best practice:  Diet: tube feeds advance as tolerated.  Pain/Anxiety/Delirium protocol (if indicated): NA VAP protocol (if indicated): ordered DVT prophylaxis: heparin gtt GI prophylaxis: PPI Glucose control: SI per protocol Mobility: BR Code Status: Full Family Communication: discussed with pt 12/17  Disposition: ICU  Labs   CBC: Recent Labs  Lab 08/06/2019 1424 08/10/2019 0048 08/05/2019 1725 08/15/2019 2220 08/20/19 0129 08/21/19 0436 08/22/19 0538  WBC 19.1* 25.3*  --   --   --  9.4 10.0  NEUTROABS 17.0*  --   --   --   --   --   --   HGB 9.6*  11.2* 11.0* 10.2* 10.9* 10.9* 8.3* 8.4*  HCT 32.7*  33.0* 36.8* 30.0* 32.0* 32.0* 29.4* 30.0*  MCV 71.1* 70.5*  --   --   --  74.6* 74.6*  PLT 233 221  --   --   --  163 [id number]    Basic Metabolic Panel: Recent Labs  Lab 08/26/2019 0048  08/09/2019 0401 08/05/2019 0556 08/20/19 0341 08/20/19 1519 08/21/19 0436 08/21/19 1510 08/22/19 0538  NA 123* 125*  --  131* 134* 135 137 136  K 6.9* 5.6*  --  4.5 4.5 3.9 4.1 4.0  CL 87* 86*   < > 96* 101 103 104 103  CO2 25 29   < > 26 26 25 25 26   GLUCOSE 119* 110*   < > 135* 115* 104* 105* 107*  BUN 61* 56*   < > 34* 23* 19 17 14   CREATININE 2.05* 1.99*   < > 1.00 0.87 0.73 0.61 0.85  CALCIUM 8.3* 8.3*   < > 8.3* 8.1* 7.5* 8.1* 8.3*  MG 2.6* 2.5*  --  2.5*  --  2.3  --  2.4  PHOS  --   --    < > 3.3 2.3* 1.6* 1.8* 1.8*   < > = values in this interval not displayed.   GFR: Estimated Creatinine Clearance: 81.6 mL/min (by C-G formula based on SCr of 0.85 mg/dL). Recent Labs  Lab 08/14/2019 1424 08/17/2019 1624 08/21/2019 0048 08/21/19 0436 08/22/19 0538  WBC 19.1*  --  25.3* 9.4 10.0  LATICACIDVEN 1.8 2.0*  --   --   --     Liver Function Tests: Recent Labs  Lab 08/10/2019 0048 08/14/2019 0401 08/20/19 0341 08/20/19 1519 08/21/19 0436 08/21/19 1510 08/22/19 0538  AST 34 25  --   --   --   --   --   ALT 16 15  --   --   --   --   --   ALKPHOS 54 59  --   --   --   --   --   BILITOT 0.5 1.0  --   --   --   --   --   PROT 5.3* 5.4*  --   --   --   --   --   ALBUMIN 2.3* 2.4* 2.3* 2.1* 1.8* 2.2* 2.3*   No results for input(s): LIPASE, AMYLASE in the last 168 hours. No results for input(s): AMMONIA in the last 168 hours.  ABG    Component Value Date/Time   PHART 7.358 08/20/2019 0129   PCO2ART 53.7 (H) 08/20/2019 0129   PO2ART 95.0 08/20/2019 0129   HCO3 30.4 (H) 08/20/2019 0129   TCO2 32 08/20/2019 0129   ACIDBASEDEF 1.0 01/26/2015 0425   O2SAT 98.0 08/22/2019 0538     Coagulation Profile: Recent Labs  Lab 08/15/2019 0556 08/16/2019 1234 08/21/19 0436 08/21/19 0615 08/21/19 1152  INR 2.4* 2.2* 6.5* 5.7* 2.4*    Cardiac Enzymes: No results for input(s): CKTOTAL, CKMB, CKMBINDEX, TROPONINI in the last 168 hours.  HbA1C: Hgb A1c MFr Bld  Date/Time Value Ref  Range Status  02/14/2019 06:08 AM 5.2 4.8 - 5.6 % Final    Comment:    (NOTE) Pre diabetes:          5.7%-6.4% Diabetes:              >6.4% Glycemic control for   <7.0% adults with diabetes   01/22/2015 12:43 PM 5.9 (H) 4.8 - 5.6 % Final    Comment:    (NOTE)         Pre-diabetes: 5.7 - 6.4         Diabetes: >6.4         Glycemic control for adults with diabetes: <7.0     CBG: Recent Labs  Lab 08/21/19 1526 08/21/19 1954 08/22/19 0016 08/22/19 0428 08/22/19 0729  GLUCAP 97 99 105* 97 101*    Critical care time: 39  CRITICAL CARE Performed by: [name]   Total critical care time:  minutes  Critical care time was exclusive of separately billable procedures and treating other patients.  Critical care was necessary to treat or prevent imminent or life-threatening deterioration.  Critical care was time spent personally by me on the following activities: development of treatment plan with patient and/or surrogate as well as nursing, discussions with consultants, evaluation of patient's response to treatment, examination of patient, obtaining history from patient or surrogate, ordering and performing treatments and interventions, ordering and review of laboratory studies, ordering and review of radiographic studies, pulse oximetry and re-evaluation of patient's condition.  [name], NP-C [facility] Contact / Pager information can be found on Amion  08/22/2019, 10:16 AM

## 2019-08-22 NOTE — Progress Notes (Signed)
[facility] Progress Note Patient Name: Lance Cook DOB: 03-Dec-1960 MRN: [id number]   Date of Service  08/22/2019  HPI/Events of Note  Agitation - Request to renew bilateral soft wrist restraints.   eICU Interventions  Will renew bilateral soft wrist restraints.      Intervention Category Major Interventions: Delirium, psychosis, severe agitation - evaluation and management  Halina Asano [name] 08/22/2019, 10:40 PM

## 2019-08-22 NOTE — Progress Notes (Signed)
Advanced Heart Failure Rounding Note   Subjective:    Awake on vent but with delirium. Failed wean due to apnea.   Remains on CVVHD with UF ~150/hr. Minimal urine output   CVP 16   Remains on IV amio and heparin. No bleeding  Objective:   Weight Range:  Vital Signs:   Temp:  [95.9 F (35.5 C)-97.8 F (36.6 C)] 97.8 F (36.6 C) (12/21 0733) Pulse Rate:  [61-90] 87 (12/21 0800) Resp:  [10-32] 32 (12/21 0800) BP: (60-120)/(16-90) 107/51 (12/21 0800) SpO2:  [66 %-100 %] 100 % (12/21 0800) Arterial Line BP: (74-133)/(40-66) 109/47 (12/21 0600) FiO2 (%):  [40 %] 40 % (12/21 0801) Weight:  [77.2 kg] 77.2 kg (12/21 0500)    Weight change: Filed Weights   08/20/19 0500 08/21/19 0500 08/22/19 0500  Weight: 87.3 kg 88.5 kg 77.2 kg    Intake/Output:   Intake/Output Summary (Last 24 hours) at 08/22/2019 1041 Last data filed at 08/22/2019 0700 Gross per 24 hour  Intake 2997.36 ml  Output 6497 ml  Net -3499.64 ml     Physical Exam: General:  Ill-appearing awake on vent   HEENT: normal + ETT Neck: supple. R neck wound ok. LIJ trialysis cath CVP 16thryomegaly appreciated. Cor: PMI nondisplaced. Irregular rate & rhythm. mech s1/s2 3/6 SEM. Lungs: coarse Abdomen: soft, nontender, nondistended. No hepatosplenomegaly. No bruits or masses. Good bowel sounds. Extremities: no cyanosis, clubbing, rash, 2+ flank and LE edema Neuro: awake on vent. Not following commands   Telemetry: AF 80-90s with PVCs. Personally reviewed    Labs: Basic Metabolic Panel: Recent Labs  Lab 08/06/2019 0048 09/01/2019 0401 08/08/2019 0556 08/20/19 0341 08/20/19 1519 08/21/19 0436 08/21/19 1510 08/22/19 0538  NA 123* 125*  --  131* 134* 135 137 136  K 6.9* 5.6*  --  4.5 4.5 3.9 4.1 4.0  CL 87* 86*   < > 96* 101 103 104 103  CO2 25 29   < > 26 26 25 25 26   GLUCOSE 119* 110*   < > 135* 115* 104* 105* 107*  BUN 61* 56*   < > 34* 23* 19 17 14   CREATININE 2.05* 1.99*   < > 1.00 0.87 0.73  0.61 0.85  CALCIUM 8.3* 8.3*   < > 8.3* 8.1* 7.5* 8.1* 8.3*  MG 2.6* 2.5*  --  2.5*  --  2.3  --  2.4  PHOS  --   --    < > 3.3 2.3* 1.6* 1.8* 1.8*   < > = values in this interval not displayed.    Liver Function Tests: Recent Labs  Lab 08/27/2019 0048 08/20/2019 0401 08/20/19 0341 08/20/19 1519 08/21/19 0436 08/21/19 1510 08/22/19 0538  AST 34 25  --   --   --   --   --   ALT 16 15  --   --   --   --   --   ALKPHOS 54 59  --   --   --   --   --   BILITOT 0.5 1.0  --   --   --   --   --   PROT 5.3* 5.4*  --   --   --   --   --   ALBUMIN 2.3* 2.4* 2.3* 2.1* 1.8* 2.2* 2.3*   No results for input(s): LIPASE, AMYLASE in the last 168 hours. No results for input(s): AMMONIA in the last 168 hours.  CBC: Recent Labs  Lab 08/13/2019 1424  08/04/2019 0048 08/28/2019 1725 08/29/2019 2220 08/20/19 0129 08/21/19 0436 08/22/19 0538  WBC 19.1* 25.3*  --   --   --  9.4 10.0  NEUTROABS 17.0*  --   --   --   --   --   --   HGB 9.6*  11.2* 11.0* 10.2* 10.9* 10.9* 8.3* 8.4*  HCT 32.7*  33.0* 36.8* 30.0* 32.0* 32.0* 29.4* 30.0*  MCV 71.1* 70.5*  --   --   --  74.6* 74.6*  PLT 233 221  --   --   --  163 161    Cardiac Enzymes: No results for input(s): CKTOTAL, CKMB, CKMBINDEX, TROPONINI in the last 168 hours.  BNP: BNP (last 3 results) Recent Labs    07/19/19 0956 08/02/19 0957 08/02/2019 1424  BNP 298.7* 167.3* 106.1*    ProBNP (last 3 results) No results for input(s): PROBNP in the last 8760 hours.    Other results:  Imaging: DG Abd 1 View  Result Date: 08/20/2019 CLINICAL DATA:  Enteric tube placement. EXAM: ABDOMEN - 1 VIEW COMPARISON:  None. FINDINGS: Enteric tube tip near the pylorus and possibly within the first portion of the duodenum. The bowel gas pattern is normal. Small amount of oral contrast in the gastric fundus. No radio-opaque calculi or other significant radiographic abnormality are seen. Prior cholecystectomy. No acute osseous abnormality. IMPRESSION: 1. Enteric  tube tip near the pylorus/first portion of the duodenum. Electronically Signed   By: Titus Dubin M.D.   On: 08/20/2019 12:20     Medications:     Scheduled Medications: . albuterol  2.5 mg Nebulization TID  . amiodarone  150 mg Intravenous Once  . chlorhexidine gluconate (MEDLINE KIT)  15 mL Mouth Rinse BID  . Chlorhexidine Gluconate Cloth  6 each Topical Q0600  . insulin aspart  2-6 Units Subcutaneous Q4H  . mouth rinse  15 mL Mouth Rinse 10 times per day  . methocarbamol  500 mg Per Tube TID  . pantoprazole sodium  40 mg Per Tube Daily  . polyethylene glycol  17 g Oral Daily    Infusions: .  prismasol BGK 4/2.5 500 mL/hr at 08/22/19 0851  .  prismasol BGK 4/2.5 300 mL/hr at 08/21/19 1148  . sodium chloride 10 mL/hr at 08/22/19 0700  . amiodarone 30 mg/hr (08/22/19 1039)  . dexmedetomidine (PRECEDEX) IV infusion 0.8 mcg/kg/hr (08/22/19 0700)  . feeding supplement (NEPRO CARB STEADY) 30 mL/hr at 08/22/19 0300  . fentaNYL infusion INTRAVENOUS 150 mcg/hr (08/22/19 0700)  . heparin 1,750 Units/hr (08/22/19 0124)  . norepinephrine (LEVOPHED) Adult infusion 3.013 mcg/min (08/22/19 1040)  . prismasol BGK 4/2.5 1,500 mL/hr at 08/22/19 0850    PRN Medications: sodium chloride, bisacodyl, fentaNYL, fentaNYL (SUBLIMAZE) injection, fentaNYL (SUBLIMAZE) injection, heparin, oxyCODONE, sennosides   Assessment/Plan:   1. AKI on CKD stage 3: Creatinine up to 2.7 at admission with marked hyperkalemia and hyponatremia in setting of use of torsemide, metolazone, and spironolactone at home.  He had urgent HD with improvement.  - Remains on CVVHD pulling 100-150. Urine output minimal - Remains volume overloaded  CVP 16. Continue to UF - Renal following 2. Acute on chronic systolic CHF/cardiogenic shock: EF 30-35% on last echo.  [employer] CRT-D, LV not on due to diaphragmatic stimulation.  Hypotension in setting of AKI/hyperkalemia. NE remains at 3   Marked volume overload improving  with CVVHD and improving u/o - Co-ox inaccurate  Continue to wean NE as BP tolerates - Continue CVVH for volume removal.  Baseline weight about 80kg (currently 77 kg but CVP still up and has significant edema) 3. VT: Patient has [employer] CRT-D device, LV lead off due to diaphragmatic pacing.  Patient has has runs of VT, now on amiodarone gtt.  - VT quiescent - Keep K > 4.0 and Mg > 2.0 4. Digoxin toxicity: Severely elevated digoxin level, has had digibind and HD.  - bradycardia improved - dig level falling. No need for repeat digibind as long as clinically stable 5. CAD: Last cath in 2016 with patent native coronaries, probably occluded SVG-RCA.  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  - Continue heparin  7. Atrial fibrillation: Chronic.   - rate controlled 80-90s - heparin as above - continue IV amio for now  D/w Dr. Shearon Stalls at bedside   CRITICAL CARE Performed by: Glori Bickers  Total critical care time: 40 minutes  Critical care time was exclusive of separately billable procedures and treating other patients.  Critical care was necessary to treat or prevent imminent or life-threatening deterioration.  Critical care was time spent personally by me (independent of midlevel providers or residents) on the following activities: development of treatment plan with patient and/or surrogate as well as nursing, discussions with consultants, evaluation of patient's response to treatment, examination of patient, obtaining history from patient or surrogate, ordering and performing treatments and interventions, ordering and review of laboratory studies, ordering and review of radiographic studies, pulse oximetry and re-evaluation of patient's condition.    Length of Stay: 4   Glori Bickers MD 08/22/2019, 10:41 AM  Advanced Heart Failure Team Pager [phone] (M-F; [place])  Please contact [facility] for night-coverage after hours (4p -7a ) and  weekends on [email or web address]

## 2019-08-23 DIAGNOSIS — R570 Cardiogenic shock: Secondary | ICD-10-CM

## 2019-08-23 DIAGNOSIS — E43 Unspecified severe protein-calorie malnutrition: Secondary | ICD-10-CM | POA: Insufficient documentation

## 2019-08-23 LAB — RENAL FUNCTION PANEL
Albumin: 2.3 g/dL — ABNORMAL LOW (ref 3.5–5.0)
Albumin: 2.1 g/dL — ABNORMAL LOW (ref 3.5–5.0)
Anion gap: 10 (ref 5–15)
Anion gap: 9 (ref 5–15)
BUN: 14 mg/dL (ref 6–20)
BUN: 16 mg/dL (ref 6–20)
CO2: 24 mmol/L (ref 22–32)
CO2: 24 mmol/L (ref 22–32)
Calcium: 8.2 mg/dL — ABNORMAL LOW (ref 8.9–10.3)
Calcium: 7.8 mg/dL — ABNORMAL LOW (ref 8.9–10.3)
Chloride: 104 mmol/L (ref 98–111)
Chloride: 104 mmol/L (ref 98–111)
Creatinine, Ser: 1 mg/dL (ref 0.61–1.24)
Creatinine, Ser: 1.17 mg/dL (ref 0.61–1.24)
GFR calc Af Amer: >60 mL/min (ref >60)
GFR calc Af Amer: >60 mL/min (ref >60)
GFR calc non Af Amer: >60 mL/min (ref >60)
GFR calc non Af Amer: >60 mL/min (ref >60)
Glucose, Bld: 107 mg/dL — ABNORMAL HIGH (ref 70–99)
Glucose, Bld: 131 mg/dL — ABNORMAL HIGH (ref 70–99)
Phosphorus: 2.1 mg/dL — ABNORMAL LOW (ref 2.5–4.6)
Phosphorus: 2.9 mg/dL (ref 2.5–4.6)
Potassium: 4 mmol/L (ref 3.5–5.1)
Potassium: 3.7 mmol/L (ref 3.5–5.1)
Sodium: 138 mmol/L (ref 135–145)
Sodium: 137 mmol/L (ref 135–145)

## 2019-08-23 LAB — CULTURE, BLOOD (ROUTINE X 2)
Culture: NO GROWTH
Culture: NO GROWTH
Special Requests: ADEQUATE

## 2019-08-23 LAB — GLUCOSE, CAPILLARY
Glucose-Capillary: 104 mg/dL — ABNORMAL HIGH (ref 70–99)
Glucose-Capillary: 110 mg/dL — ABNORMAL HIGH (ref 70–99)
Glucose-Capillary: 103 mg/dL — ABNORMAL HIGH (ref 70–99)
Glucose-Capillary: 123 mg/dL — ABNORMAL HIGH (ref 70–99)
Glucose-Capillary: 114 mg/dL — ABNORMAL HIGH (ref 70–99)
Glucose-Capillary: 114 mg/dL — ABNORMAL HIGH (ref 70–99)

## 2019-08-23 LAB — CBC
HCT: 29.4 % — ABNORMAL LOW (ref 39.0–52.0)
Hemoglobin: 8.4 g/dL — ABNORMAL LOW (ref 13.0–17.0)
MCH: 21.2 pg — ABNORMAL LOW (ref 26.0–34.0)
MCHC: 28.6 g/dL — ABNORMAL LOW (ref 30.0–36.0)
MCV: 74.2 fL — ABNORMAL LOW (ref 80.0–100.0)
Platelets: 142 10*3/uL — ABNORMAL LOW (ref 150–400)
RBC: 3.96 MIL/uL — ABNORMAL LOW (ref 4.22–5.81)
RDW: 20.3 % — ABNORMAL HIGH (ref 11.5–15.5)
WBC: 11.7 10*3/uL — ABNORMAL HIGH (ref 4.0–10.5)
nRBC: 0.3 % — ABNORMAL HIGH (ref 0.0–0.2)

## 2019-08-23 LAB — MAGNESIUM: Magnesium: 2.6 mg/dL — ABNORMAL HIGH (ref 1.7–2.4)

## 2019-08-23 LAB — COOXEMETRY PANEL
Carboxyhemoglobin: 1.6 % — ABNORMAL HIGH (ref 0.5–1.5)
Methemoglobin: 0.9 % (ref 0.0–1.5)
O2 Saturation: 62.6 %
Total hemoglobin: 8.4 g/dL — ABNORMAL LOW (ref 12.0–16.0)

## 2019-08-23 LAB — HEPARIN LEVEL (UNFRACTIONATED): Heparin Unfractionated: 0.33 IU/mL (ref 0.30–0.70)

## 2019-08-23 MED ORDER — SODIUM PHOSPHATES 45 MMOLE/15ML IV SOLN
30.0000 mmol | Freq: Once | INTRAVENOUS | Status: AC
  Administered 2019-08-23: 30 mmol via INTRAVENOUS
  Filled 2019-08-23: qty 10

## 2019-08-23 MED ORDER — PRO-STAT SUGAR FREE PO LIQD
30.0000 mL | Freq: Three times a day (TID) | ORAL | Status: DC
  Administered 2019-08-23 – 2019-08-24 (×5): 30 mL
  Filled 2019-08-23 (×5): qty 30

## 2019-08-23 MED ORDER — NEPRO/CARBSTEADY PO LIQD
1000.0000 mL | ORAL | Status: DC
  Administered 2019-08-24: 01:00:00 1000 mL via ORAL
  Filled 2019-08-23 (×2): qty 1000

## 2019-08-23 MED ORDER — HYPROMELLOSE (GONIOSCOPIC) 2.5 % OP SOLN
1.0000 [drp] | Freq: Three times a day (TID) | OPHTHALMIC | Status: DC
  Administered 2019-08-23 – 2019-09-05 (×39): 1 [drp] via OPHTHALMIC
  Filled 2019-08-23: qty 15

## 2019-08-23 NOTE — Progress Notes (Addendum)
NAME:  Lance Cook, MRN:  [id number], DOB:  1961-01-27, LOS: 5 ADMISSION DATE:  08/29/2019, CONSULTATION DATE:  08/31/2019 REFERRING MD:  Dr Lance Cook, CHIEF COMPLAINT:  Hyperkalemia    Brief History   58 yo male found to have severe life-threatening hyperkalemia, PCCM consulted for admission and need for urgent iHD.  History of present illness   58 year old man, history of Bentall procedure, MVR, atrial fibrillation, CAD with chronic systolic and diastolic CHF.  Came to the ER with fatigue, weakness and hypotension.  He was found to be profoundly bradycardic with severe left bundle branch block and QRS widening on EKG.  Found to be severely hyperkalemic with new acute renal failure.  He is on digoxin at baseline and his digoxin level was 2.6.  He will be admitted emergently to the ICU for hemodialysis. He was given digibind for dig toxicity.   Significant Hospital Events   12/17 Admitted, urgent HD 12/18 had HD catheter in carotid removed in OR. Returned intubated  Consults:  Nephrology Heart Failure  Procedures:  R IJ HD catheter 12/17 >> Misplaced in carotid, removed in OR 12/18  L IJ HD catheter 12/17 >>   Significant Diagnostic Tests:  NM cardiac amyloid scan 10/21 >> equivocal for any evidence amyloidosis  ECHO 12/18 >  1. Left ventricular ejection fraction, by visual estimation, is 30 to 35%. The left ventricle has severely decreased function. Severely increased left ventricular size. There is no left ventricular hypertrophy.  2. Definity contrast agent was given IV to delineate the left ventricular endocardial borders.  3. Global hypokinesis, though function more preserved at base than apex. LV contrast excludes apical thrombus.  4. Global right ventricle has normal systolic function.The right ventricular size is normal. No increase in right ventricular wall thickness.  5. Left atrial size was normal.  6. Right atrial size was normal.  7. The mitral valve has been  repaired/replaced. Mild mitral valve regurgitation. Moderate mitral stenosis.  8. The tricuspid valve is normal in structure. Tricuspid valve regurgitation mild-moderate.  9. 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 10. The aortic valve is normal in structure. Aortic valve regurgitation is trivial by color flow Doppler. Structurally normal aortic valve, with no evidence of sclerosis or stenosis. 11. Aortic valve with trivial perivalvular leak. 12. The pulmonic valve was grossly normal. Pulmonic valve regurgitation is not visualized by color flow Doppler. 13. S/P Bentall. 14. Moderately elevated pulmonary artery systolic pressure. 15. A pacer wire is visualized in the RV and RA. 16. The inferior vena cava is dilated in size with <50% respiratory variability, suggesting right atrial pressure of 15 mmHg. 17. History of mechanical aortic and mitral valves. Leaflets not well visualized, could consider fluroscopy of leaflets, but gradients not suggestive of obstruction.  CXR 12/17 > New left internal jugular central line tip in the SVC above the right atrium. No pneumothorax. No other change.  Micro Data:  COVID 12/17 > negative  Blood cultures 12/17 >  MRSA PCR 12/17 >negative  Urine culture 12/18 >  Negative  Antimicrobials:    Interim history/subjective:  RN reports no acute events overnight, appears less delirious this morning. Able to follow simple commands.   Objective   Blood pressure (!) 136/49, pulse 91, temperature 99.8 F (37.7 C), temperature source Oral, resp. rate 17, height 5' (1.524 m), weight 74.9 kg, SpO2 100 %. CVP:  [14 mmHg-16 mmHg] 14 mmHg  Vent Mode: PSV;CPAP FiO2 (%):  [40 %] 40 % Set Rate:  [25 bmp]  25 bmp Vt Set:  [420 mL] 420 mL PEEP:  [5 cmH20] 5 cmH20 Pressure Support:  [8 cmH20-10 cmH20] 10 cmH20 Plateau Pressure:  [17 cmH20-28 cmH20] 17 cmH20   Intake/Output Summary (Last 24 hours) at 08/23/2019 [id number] Last data filed at 08/23/2019 0900 Gross  per 24 hour  Intake 3544.76 ml  Output 4934 ml  Net -1389.24 ml   Filed Weights   08/21/19 0500 08/22/19 0500 08/23/19 0237  Weight: 88.5 kg 77.2 kg 74.9 kg    Examination: General: Chronically ill appearing elderly male on mechanical ventilation, in NAD HEENT: ETT, MM pink/moist, PERRL,  Neuro: Awake and alert on vent, able to follow commands today, CAM-ICU negative  CV: s1s2 regular rate and rhythm, no murmur, rubs, or gallops,  PULM:  Clear to ascultation bilaterally, no added breath sounds, no increased work of breathing  GI: soft, bowel sounds active in all 4 quadrants, non-tender, non-distended, tolerating TF Extremities: warm/dry, 2+ LE edema  Skin: no rashes or lesions  Resolved Hospital Problem list   Coagulopathy with elevated INR  Assessment & Plan:  [name] is a 58 yo gentleman with significant cardiac history including HFrEF, s/p MVR, s/p Bentall procedure who presents with:  Acute hypoxemic respiratory failure - intubated in the OR - failed SBT for apnea. He is delirious.  Plan: Continue ventilator support with lung protective strategies  Daily attempts at SBT, mentation preclude extubation  Wean PEEP and FiO2 for sats greater than 90%. Head of bed elevated 30 degrees. Plateau pressures less than 30 cm H20.  Follow intermittent chest x-ray and ABG.  Ensure adequate pulmonary hygiene  Follow cultures  VAP bundle in place  PAD protocol CRRT for volume removal   Acute Encephalopathy - In the setting of delirium, metabolic, digoxin toxicity, renal failure.  - CAM ICU remains positive Plan: Precedex for agitation Minimize sedation as able  Delirium precautions   Digoxin Toxicity - level on 12/18 was 9. Was s/p 2 units digibind, now down to 4.1 Plan: Continue to monitor dig levels  No indication for repeat digibind at this time, re-dose as needed  Will be unable to utilize dig in the future   AKI superimposed on CKD stage 3 Severe life-threatening  hyperkalemia  -Due to acute on chronic (stage II-III) renal failure.  Etiology of his renal failure likely includes use of diuretics, poor p.o. intake.  He is on potassium supplementation as well. Also digoxin Plan Nephrology following Continue CRRT with volume removal Up titrate pressors to facilities volume removal<CVP 14 Trend Bmet Follow renal functions and urine output Avoid nephrotoxins   Cardiogenic Shock Acute on Chronic systolic and diastolic CHF with CAD Mechanical mitral valve,  Chronic atrial fibrillation AICD/pacer.  -Admitted with severe left bundle branch block, wide-complex EKG, enlarged T waves, severe bradycardia, -Baseline anticoagulation with coumadin  -Suspect wide complex tachycardia secondary to acute electrolyte abnormalities in the setting of heart disease, also with digoxin toxicity. Per HF note his LV lead is not currently on.  Plan: Heart failure following, appreciate assistance Continue Heaprin drip  CRRT per nephrology, remains significantly volume overloaded  Hold home Aldactone and Lopressor  Continue OV amiodarone and Levophed   DM type 2 Plan SSI   Best practice:  Diet: tube feeds advance as tolerated.  Pain/Anxiety/Delirium protocol (if indicated): NA VAP protocol (if indicated): ordered DVT prophylaxis: heparin gtt GI prophylaxis: PPI Glucose control: SI per protocol Mobility: BR Code Status: Full Family Communication: discussed with pt 12/17 Disposition: ICU  Labs  CBC: Recent Labs  Lab 08/22/2019 1424 08/20/2019 0048 08/31/2019 2220 08/20/19 0129 08/21/19 0436 08/22/19 0538 08/23/19 0213  WBC 19.1* 25.3*  --   --  9.4 10.0 11.7*  NEUTROABS 17.0*  --   --   --   --   --   --   HGB 11.2*  9.6*  11.2* 11.0* 10.9* 10.9* 8.3* 8.4* 8.4*  HCT 33.0*  32.7*  33.0* 36.8* 32.0* 32.0* 29.4* 30.0* 29.4*  MCV 71.1* 70.5*  --   --  74.6* 74.6* 74.2*  PLT 233 221  --   --  163 161 142*    Basic Metabolic Panel: Recent Labs  Lab  08/13/2019 0401 08/03/2019 0556 08/20/19 0341 08/21/19 0436 08/21/19 1510 08/22/19 0538 08/22/19 1639 08/23/19 0213  NA 125*  --  131* 135 137 136 137 138  K 5.6*  --  4.5 3.9 4.1 4.0 3.9 4.0  CL 86*   < > 96* 103 104 103 107 104  CO2 29   < > 26 25 25 26 25 24   GLUCOSE 110*   < > 135* 104* 105* 107* 109* 107*  BUN 56*   < > 34* 19 17 14 14 14   CREATININE 1.99*   < > 1.00 0.73 0.61 0.85 1.01 1.00  CALCIUM 8.3*   < > 8.3* 7.5* 8.1* 8.3* 7.8* 8.2*  MG 2.5*  --  2.5* 2.3  --  2.4  --  2.6*  PHOS  --    < > 3.3 1.6* 1.8* 1.8* 2.5 2.1*   < > = values in this interval not displayed.   GFR: Estimated Creatinine Clearance: 68.3 mL/min (by C-G formula based on SCr of 1 mg/dL). Recent Labs  Lab 08/28/2019 1424 08/04/2019 1624 08/06/2019 0048 08/21/19 0436 08/22/19 0538 08/23/19 0213  WBC 19.1*  --  25.3* 9.4 10.0 11.7*  LATICACIDVEN 1.8 2.0*  --   --   --   --     Liver Function Tests: Recent Labs  Lab 08/25/2019 0048 08/21/2019 0401 08/21/19 0436 08/21/19 1510 08/22/19 0538 08/22/19 1639 08/23/19 0213  AST 34 25  --   --   --   --   --   ALT 16 15  --   --   --   --   --   ALKPHOS 54 59  --   --   --   --   --   BILITOT 0.5 1.0  --   --   --   --   --   PROT 5.3* 5.4*  --   --   --   --   --   ALBUMIN 2.3* 2.4* 1.8* 2.2* 2.3* 2.2* 2.3*   No results for input(s): LIPASE, AMYLASE in the last 168 hours. No results for input(s): AMMONIA in the last 168 hours.  ABG    Component Value Date/Time   PHART 7.358 08/20/2019 0129   PCO2ART 53.7 (H) 08/20/2019 0129   PO2ART 95.0 08/20/2019 0129   HCO3 30.4 (H) 08/20/2019 0129   TCO2 32 08/20/2019 0129   ACIDBASEDEF 1.0 01/26/2015 0425   O2SAT 62.6 08/23/2019 0213     Coagulation Profile: Recent Labs  Lab 09/01/2019 0556 08/29/2019 1234 08/21/19 0436 08/21/19 0615 08/21/19 1152  INR 2.4* 2.2* 6.5* 5.7* 2.4*    Cardiac Enzymes: No results for input(s): CKTOTAL, CKMB, CKMBINDEX, TROPONINI in the last 168 hours.  HbA1C: Hgb A1c  MFr Bld  Date/Time Value Ref Range Status  02/14/2019 06:08 AM  5.2 4.8 - 5.6 % Final    Comment:    (NOTE) Pre diabetes:          5.7%-6.4% Diabetes:              >6.4% Glycemic control for   <7.0% adults with diabetes   01/22/2015 12:43 PM 5.9 (H) 4.8 - 5.6 % Final    Comment:    (NOTE)         Pre-diabetes: 5.7 - 6.4         Diabetes: >6.4         Glycemic control for adults with diabetes: <7.0     CBG: Recent Labs  Lab 08/22/19 1525 08/22/19 1924 08/22/19 2326 08/23/19 0346 08/23/19 0752  GLUCAP 91 101* 91 104* 110*    Critical care time: 39  CRITICAL CARE Performed by: [name]   Total critical care time:  minutes  Critical care time was exclusive of separately billable procedures and treating other patients.  Critical care was necessary to treat or prevent imminent or life-threatening deterioration.  Critical care was time spent personally by me on the following activities: development of treatment plan with patient and/or surrogate as well as nursing, discussions with consultants, evaluation of patient's response to treatment, examination of patient, obtaining history from patient or surrogate, ordering and performing treatments and interventions, ordering and review of laboratory studies, ordering and review of radiographic studies, pulse oximetry and re-evaluation of patient's condition.  [name], NP-C [facility] Contact / Pager information can be found on Amion  08/23/2019, 9:06 AM

## 2019-08-23 NOTE — Progress Notes (Signed)
Nutrition Follow-up  DOCUMENTATION CODES:   Severe malnutrition in context of chronic illness  INTERVENTION:    Increase Nepro to 40 ml/h  Add Pro-stat 30 ml TID  Provides 2028 kcal, 123 gm protein, 698 ml free water daily  NUTRITION DIAGNOSIS:   Severe Malnutrition related to chronic illness(CHF) as evidenced by severe fat depletion, severe muscle depletion.  Ongoing   GOAL:   Patient will meet greater than or equal to 90% of their needs  Met with TF  MONITOR:   Vent status, TF tolerance, Skin, Labs  ASSESSMENT:   58 year old male with history of Bentall procedure, MVR, Afib, T2DM, CAD with chronic systolic and diastolic CHF, Bells palsy, h/o CVA, OSA, GERD, HTN, Gout, PVD s/p mitral valve replacement 2016, testicular cancer, and tubulovillous adenoma of colon who presented to ED with fatigue, weakness, and hypotension. Patient found to be profoundly bradycardic with severe left bundle branch block; severely hyperkalemic with new onset acute renal failure. Patient admitted to ICU for emergent hemodialysis.  Discussed patient in ICU rounds and with RN today. CRRT continues for volume removal. Failing SBTs due to encephalopathy and volume overload. Low phosphorus is being replaced.   Patient remains intubated on ventilator support MV: 14.5 L/min Temp (24hrs), Avg:99 F (37.2 C), Min:97.5 F (36.4 C), Max:101.5 F (38.6 C)  Propofol: none   Labs reviewed. Potassium 4, phosphorus 2.1 (L) CBG's: 104-110-103  Medications reviewed and include levophed, sodium phosphate.  Receiving Nepro at 30 ml/h via OGT. Tolerating well.  Weight trending down. I/O - 8.5 L since admission.  NUTRITION - FOCUSED PHYSICAL EXAM:    Most Recent Value  Orbital Region  Severe depletion  Upper Arm Region  Unable to assess  Thoracic and Lumbar Region  Unable to assess  Buccal Region  Severe depletion  Temple Region  Severe depletion  Clavicle Bone Region  Severe depletion   Clavicle and Acromion Bone Region  Severe depletion  Scapular Bone Region  Severe depletion  Dorsal Hand  Unable to assess  Patellar Region  Unable to assess  Anterior Thigh Region  Unable to assess  Posterior Calf Region  Unable to assess  Edema (RD Assessment)  Severe  Hair  Reviewed  Eyes  Unable to assess  Mouth  Unable to assess  Skin  Reviewed  Nails  Reviewed       Diet Order:   Diet Order            Diet NPO time specified Except for: Sips with Meds  Diet effective now              EDUCATION NEEDS:   No education needs have been identified at this time  Skin:  Skin Assessment: Reviewed RN Assessment(incision;closed; right neck)  Last BM:  PTA  Height:   Ht Readings from Last 1 Encounters:  08/23/19 5' 2.21" (1.58 m)  (measured by RN today)  Weight:   Wt Readings from Last 1 Encounters:  08/23/19 74.9 kg    Ideal Body Weight:  53.6 kg  BMI:  Body mass index is 30 kg/m.  Estimated Nutritional Needs:   Kcal:  2080  Protein:  110-135 gm  Fluid:  >/= 2 L    Molli Barrows, RD, LDN, [place] Pager [phone] After Hours Pager [phone]

## 2019-08-23 NOTE — Progress Notes (Signed)
ANTICOAGULATION CONSULT NOTE - Follow Up Consult  Pharmacy Consult for Heparin Indication: Hx St Jude MVR/AVR + Afib  Patient Measurements: Height: 5' (152.4 cm) Weight: 165 lb 2 oz (74.9 kg) IBW/kg (Calculated) : 50 Heparin Dosing Weight: 70 kg  Vital Signs: Temp: 98 F (36.7 C) (12/22 0355) Temp Source: Oral (12/22 0355) BP: 110/38 (12/22 0700) Pulse Rate: 92 (12/22 0715)  Labs: Recent Labs    08/21/19 0436 08/21/19 0436 08/21/19 0615 08/21/19 0727 08/21/19 1152 08/21/19 2157 08/22/19 0538 08/22/19 1639 08/23/19 0213  HGB 8.3*  --   --   --   --   --  8.4*  --  8.4*  HCT 29.4*  --   --   --   --   --  30.0*  --  29.4*  PLT 163  --   --   --   --   --  161  --  142*  LABPROT 57.4*  --  51.7*  --  25.7*  --   --   --   --   INR 6.5*  --  5.7*  --  2.4*  --   --   --   --   HEPARINUNFRC 1.62*  --   --   --   --  0.44 0.39  --  0.33  CREATININE 0.73   < >  --    < >  --   --  0.85 1.01 1.00   < > = values in this interval not displayed.    Estimated Creatinine Clearance: 68.3 mL/min (by C-G formula based on SCr of 1 mg/dL).  Assessment: 30 YOM on warfarin PTA for hx St Jude MVR + Afib - admitted with AKI and hyperkalemia requiring line replacement and start of CRRT. FFP given and Heparin bridge started when INR<2.5. Pharmacy consulted to dose.   Heparin level therapeutic this AM, no bleeding reported and CBC stable  Goal of Therapy:  Heparin level 0.3-0.7 units/ml Monitor platelets by anticoagulation protocol: Yes   Plan:  Continue heparin gtt at 1750 units/hr Daily heparin level, CBC, s/s bleeding  Bertis Ruddy, PharmD Clinical Pharmacist Please check AMION for all [place] numbers 08/23/2019 7:34 AM

## 2019-08-23 NOTE — Progress Notes (Signed)
[facility] NEPHROLOGY PROGRESS NOTE  Assessment/ Plan: Pt is a 58 y.o. yo male CHF, mechanical mitral and aortic valve A. fib, VT arrest, DM, potassium of 8.4 associated with hypotension and AKI on admission.  #Severe hyperkalemia with EKG changes due to use of spironolactone, KCl and AKI: Received urgent dialysis on admission, started CRRT on 12/18 for fluid overload.   Potassium level improved.  #Acute on CKD stage III: Baseline creatinine around 1.3-1.6, likely ischemic ATN due to shock.  On CRRT with total UF of around 5 L in last 24 hours.  On IV heparin, 4K bath. CVP is still around 15 with fluid overload.  Plan to continue CRRT today to optimize volume status.  UF around 100 to 150 cc/hour. Monitor urine output, BMP.  #Acute on chronic hyponatremia: Probably due to diuretics.  He was on metolazone, torsemide and Aldactone at home.  Sodium level  improved.  #Acute on Chronic CHF, aortic and mitral valve replacement: Per cardiology.  UF with CRRT.  #Hypotension/shock: On Levophed.  #Acute respiratory failure/VDRF: Intubated, per PCCM.  Discussed with ICU team.  Subjective: Seen and examined at bedside.  Tolerating UF during CRRT.  No event.  Urine output is recorded only 80 cc.  He is  sedated and intubated. Objective Vital signs in last 24 hours: Vitals:   08/23/19 0715 08/23/19 0800 08/23/19 0900 08/23/19 1000  BP:  (!) 118/49 (!) 136/49 (!) 114/52  Pulse: 92 93 91 (!) 102  Resp: (!) _0 Temp: 100 F (37.8 C) 99.8 F (37.7 C)    TempSrc: Axillary Oral    SpO2: 100% 95% 100% 100%  Weight:      Height:       Weight change: -2.3 kg  Intake/Output Summary (Last 24 hours) at 08/23/2019 1009 Last data filed at 08/23/2019 1000 Gross per 24 hour  Intake 3625.98 ml  Output 4940 ml  Net -1314.02 ml       Labs: Basic Metabolic Panel: Recent Labs  Lab 08/22/19 0538 08/22/19 1639 08/23/19 0213  NA 136 137 138  K 4.0 3.9 4.0  CL 103 107  104  CO2 _1 GLUCOSE 107* 109* 107*  BUN _2 CREATININE 0.85 1.01 1.00  CALCIUM 8.3* 7.8* 8.2*  PHOS 1.8* 2.5 2.1*   Liver Function Tests: Recent Labs  Lab 08/15/2019 0048 08/08/2019 0401 08/22/19 0538 08/22/19 1639 08/23/19 0213  AST 34 25  --   --   --   ALT 16 15  --   --   --   ALKPHOS 54 59  --   --   --   BILITOT 0.5 1.0  --   --   --   PROT 5.3* 5.4*  --   --   --   ALBUMIN 2.3* 2.4* 2.3* 2.2* 2.3*   No results for input(s): LIPASE, AMYLASE in the last 168 hours. No results for input(s): AMMONIA in the last 168 hours. CBC: Recent Labs  Lab 08/15/2019 1424 08/07/2019 0048 08/21/19 0436 08/22/19 0538 08/23/19 0213  WBC 19.1* 25.3* 9.4 10.0 11.7*  NEUTROABS 17.0*  --   --   --   --   HGB 11.2*  9.6*  11.2* 11.0* 8.3* 8.4* 8.4*  HCT 33.0*  32.7*  33.0* 36.8* 29.4* 30.0* 29.4*  MCV 71.1* 70.5* 74.6* 74.6* 74.2*  PLT 233 221 163 161 142*   Cardiac Enzymes: No results for input(s): CKTOTAL, CKMB, CKMBINDEX, TROPONINI in the  last 168 hours. CBG: Recent Labs  Lab 08/22/19 1525 08/22/19 1924 08/22/19 2326 08/23/19 0346 08/23/19 0752  GLUCAP 91 101* 91 104* 110*    Iron Studies: No results for input(s): IRON, TIBC, TRANSFERRIN, FERRITIN in the last 72 hours. Studies/Results: No results found.  Medications: Infusions: .  prismasol BGK 4/2.5 500 mL/hr at 08/23/19 0517  .  prismasol BGK 4/2.5 300 mL/hr at 08/23/19 0824  . sodium chloride 10 mL/hr at 08/23/19 1000  . amiodarone 30 mg/hr (08/23/19 1000)  . dexmedetomidine (PRECEDEX) IV infusion 0.4 mcg/kg/hr (08/23/19 1000)  . feeding supplement (NEPRO CARB STEADY) 1,000 mL (08/23/19 0114)  . fentaNYL infusion INTRAVENOUS 75 mcg/hr (08/23/19 1000)  . heparin 1,750 Units/hr (08/23/19 0845)  . norepinephrine (LEVOPHED) Adult infusion 13 mcg/min (08/23/19 0655)  . prismasol BGK 4/2.5 1,500 mL/hr at 08/23/19 0230    Scheduled Medications: . albuterol  2.5 mg Nebulization TID  . amiodarone  150 mg  Intravenous Once  . chlorhexidine gluconate (MEDLINE KIT)  15 mL Mouth Rinse BID  . Chlorhexidine Gluconate Cloth  6 each Topical Q0600  . hydroxypropyl methylcellulose / hypromellose  1 drop Both Eyes TID  . insulin aspart  2-6 Units Subcutaneous Q4H  . mouth rinse  15 mL Mouth Rinse 10 times per day  . methocarbamol  500 mg Per Tube TID  . pantoprazole sodium  40 mg Per Tube Daily  . polyethylene glycol  17 g Oral Daily    have reviewed scheduled and prn medications.  Physical Exam: General: Intubated, sedated, opening eyes with the name, not distress Heart:RRR, s1s2 nl Lungs: Coarse breath sound bilateral Abdomen:soft,  non-distended Extremities: LE edema + Dialysis Access: Left IJ temporary catheter.  Elleanna Melling Prasad Marvell Tamer 08/23/2019,10:09 AM  LOS: 5 days  Pager: [phone]

## 2019-08-23 NOTE — Progress Notes (Signed)
Advanced Heart Failure Rounding Note   Subjective:    Awake on vent. Will follow commands. Very weak.   Now on NE 15. Remains on CVVHD but didn't pull much last night due to low BP. Weight down 5 pounds though  Now essentially anuric  CVP 14  Remains on IV amio and heparin. No bleeding.   Objective:   Weight Range:  Vital Signs:   Temp:  [97.5 F (36.4 C)-100 F (37.8 C)] 99.8 F (37.7 C) (12/22 0800) Pulse Rate:  [80-100] 93 (12/22 0800) Resp:  [13-32] 14 (12/22 0800) BP: (76-143)/(21-88) 118/49 (12/22 0800) SpO2:  [47 %-100 %] 95 % (12/22 0800) Arterial Line BP: (88-144)/(37-63) 144/63 (12/22 0800) FiO2 (%):  [40 %] 40 % (12/22 0809) Weight:  [74.9 kg] 74.9 kg (12/22 0237) Last BM Date: (PTa)  Weight change: Filed Weights   08/21/19 0500 08/22/19 0500 08/23/19 0237  Weight: 88.5 kg 77.2 kg 74.9 kg    Intake/Output:   Intake/Output Summary (Last 24 hours) at 08/23/2019 0834 Last data filed at 08/23/2019 0800 Gross per 24 hour  Intake 3545.11 ml  Output 5182 ml  Net -1636.89 ml     Physical Exam: General:  Ill-appearing. Weak  awake on vent   HEENT: normal + ETT Neck: supple. RIJ wound healing. LIJ trialysis. Carotids 2+ bilat; no bruits. No lymphadenopathy or thryomegaly appreciated. Cor: PMI nondisplaced. Irregular rate & rhythm. mech s1 & s2  2/6 SEM at RUSB and LUSB Lungs: clear Abdomen: soft, nontender, +distended. No hepatosplenomegaly. No bruits or masses. Good bowel sounds. Extremities: no cyanosis, clubbing, rash, 2-3+ edema Neuro: Awake on vent. Follows commands    Telemetry: AF 70-80s with PVCs. Personally reviewed    Labs: Basic Metabolic Panel: Recent Labs  Lab 08/09/2019 0401 08/09/2019 0556 08/20/19 0341 08/21/19 0436 08/21/19 1510 08/22/19 0538 08/22/19 1639 08/23/19 0213  NA 125*  --  131* 135 137 136 137 138  K 5.6*  --  4.5 3.9 4.1 4.0 3.9 4.0  CL 86*   < > 96* 103 104 103 107 104  CO2 29   < > 26 25 25 26 25 24    GLUCOSE 110*   < > 135* 104* 105* 107* 109* 107*  BUN 56*   < > 34* 19 17 14 14 14   CREATININE 1.99*   < > 1.00 0.73 0.61 0.85 1.01 1.00  CALCIUM 8.3*   < > 8.3* 7.5* 8.1* 8.3* 7.8* 8.2*  MG 2.5*  --  2.5* 2.3  --  2.4  --  2.6*  PHOS  --    < > 3.3 1.6* 1.8* 1.8* 2.5 2.1*   < > = values in this interval not displayed.    Liver Function Tests: Recent Labs  Lab 08/26/2019 0048 08/16/2019 0401 08/21/19 0436 08/21/19 1510 08/22/19 0538 08/22/19 1639 08/23/19 0213  AST 34 25  --   --   --   --   --   ALT 16 15  --   --   --   --   --   ALKPHOS 54 59  --   --   --   --   --   BILITOT 0.5 1.0  --   --   --   --   --   PROT 5.3* 5.4*  --   --   --   --   --   ALBUMIN 2.3* 2.4* 1.8* 2.2* 2.3* 2.2* 2.3*   No results for input(s): LIPASE,  AMYLASE in the last 168 hours. No results for input(s): AMMONIA in the last 168 hours.  CBC: Recent Labs  Lab 09/01/2019 1424 08/04/2019 0048 08/17/2019 2220 08/20/19 0129 08/21/19 0436 08/22/19 0538 08/23/19 0213  WBC 19.1* 25.3*  --   --  9.4 10.0 11.7*  NEUTROABS 17.0*  --   --   --   --   --   --   HGB 11.2*  9.6*  11.2* 11.0* 10.9* 10.9* 8.3* 8.4* 8.4*  HCT 33.0*  32.7*  33.0* 36.8* 32.0* 32.0* 29.4* 30.0* 29.4*  MCV 71.1* 70.5*  --   --  74.6* 74.6* 74.2*  PLT 233 221  --   --  163 161 142*    Cardiac Enzymes: No results for input(s): CKTOTAL, CKMB, CKMBINDEX, TROPONINI in the last 168 hours.  BNP: BNP (last 3 results) Recent Labs    07/19/19 0956 08/02/19 0957 08/12/2019 1424  BNP 298.7* 167.3* 106.1*    ProBNP (last 3 results) No results for input(s): PROBNP in the last 8760 hours.    Other results:  Imaging: No results found.   Medications:     Scheduled Medications: . albuterol  2.5 mg Nebulization TID  . amiodarone  150 mg Intravenous Once  . chlorhexidine gluconate (MEDLINE KIT)  15 mL Mouth Rinse BID  . Chlorhexidine Gluconate Cloth  6 each Topical Q0600  . insulin aspart  2-6 Units Subcutaneous Q4H  .  mouth rinse  15 mL Mouth Rinse 10 times per day  . methocarbamol  500 mg Per Tube TID  . pantoprazole sodium  40 mg Per Tube Daily  . polyethylene glycol  17 g Oral Daily    Infusions: .  prismasol BGK 4/2.5 500 mL/hr at 08/23/19 0517  .  prismasol BGK 4/2.5 300 mL/hr at 08/23/19 0824  . sodium chloride 10 mL/hr at 08/23/19 0800  . amiodarone 30 mg/hr (08/23/19 0800)  . dexmedetomidine (PRECEDEX) IV infusion Stopped (08/23/19 0758)  . feeding supplement (NEPRO CARB STEADY) 1,000 mL (08/23/19 0114)  . fentaNYL infusion INTRAVENOUS 150 mcg/hr (08/23/19 0800)  . heparin 1,750 Units/hr (08/22/19 2008)  . norepinephrine (LEVOPHED) Adult infusion 13 mcg/min (08/23/19 0655)  . prismasol BGK 4/2.5 1,500 mL/hr at 08/23/19 0824    PRN Medications: sodium chloride, bisacodyl, fentaNYL, fentaNYL (SUBLIMAZE) injection, fentaNYL (SUBLIMAZE) injection, heparin, hydroxypropyl methylcellulose / hypromellose, oxyCODONE, sennosides   Assessment/Plan:   1. AKI on CKD stage 3: Creatinine up to 2.7 at admission with marked hyperkalemia and hyponatremia in setting of use of torsemide, metolazone, and spironolactone at home.  He had urgent HD with improvement.  - Remains on CVVHD pulling 100-150. Urine output minimal - Remains volume overloaded  CVP 14. Would use NE to support BP to pull more fluid today - Renal following 2. Acute on chronic systolic CHF/cardiogenic shock: EF 30-35% on last echo.  [employer] CRT-D, LV not on due to diaphragmatic stimulation.  Hypotension in setting of AKI/hyperkalemia. NE remains at 3   Marked volume overload improving with CVVHD and improving u/o - Co-ox 63$ on NE.  Use NE to support UF - Continue CVVH for volume removal.  Baseline weight about 80kg (currently 75 kg but CVP still up and has marked peripheral edema) 3. VT: Patient has [employer] CRT-D device, LV lead off due to diaphragmatic pacing.  Patient has has runs of VT, now on amiodarone gtt.  - VT  quiescent - Keep K > 4.0 and Mg > 2.0 (K 4.0 and MG 2.6 today)  4. Digoxin toxicity: Severely elevated digoxin level, has had digibind and HD.  - bradycardia improved - dig level falling. No need for repeat digibind as long as clinically stable 5. CAD: Last cath in 2016 with patent native coronaries, probably occluded SVG-RCA.  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  - Continue heparin Hgb stable 7.4 7. Atrial fibrillation: Chronic.   - rate controlled 80-90s - heparin as above - continue IV amio for nw 8. Acute hypoxic respiratory failure - remains on vent. Weaned for a bit yesterday. Will try again today but weakness a major barrier  D/w Dr. Shearon Stalls at bedside.   He is critically ill and very weak. Struggling to wean from vent. Continue to support. If not making progress may need to re-address GOC.    CRITICAL CARE Performed by: Glori Bickers  Total critical care time: 35 minutes  Critical care time was exclusive of separately billable procedures and treating other patients.  Critical care was necessary to treat or prevent imminent or life-threatening deterioration.  Critical care was time spent personally by me (independent of midlevel providers or residents) on the following activities: development of treatment plan with patient and/or surrogate as well as nursing, discussions with consultants, evaluation of patient's response to treatment, examination of patient, obtaining history from patient or surrogate, ordering and performing treatments and interventions, ordering and review of laboratory studies, ordering and review of radiographic studies, pulse oximetry and re-evaluation of patient's condition.    Length of Stay: 5   Glori Bickers MD 08/23/2019, 8:34 AM  Advanced Heart Failure Team Pager [phone] (M-F; [place])  Please contact [facility] for night-coverage after hours (4p -7a ) and weekends on [email or web address]

## 2019-08-24 ENCOUNTER — Telehealth: Payer: Self-pay | Admitting: Cardiology

## 2019-08-24 ENCOUNTER — Other Ambulatory Visit: Payer: Self-pay

## 2019-08-24 ENCOUNTER — Inpatient Hospital Stay (HOSPITAL_COMMUNITY): Payer: Medicare HMO

## 2019-08-24 DIAGNOSIS — E43 Unspecified severe protein-calorie malnutrition: Secondary | ICD-10-CM

## 2019-08-24 LAB — COOXEMETRY PANEL
Carboxyhemoglobin: 1.7 % — ABNORMAL HIGH (ref 0.5–1.5)
Methemoglobin: 1.1 % (ref 0.0–1.5)
O2 Saturation: 73.6 %
Total hemoglobin: 8.6 g/dL — ABNORMAL LOW (ref 12.0–16.0)

## 2019-08-24 LAB — HEPARIN LEVEL (UNFRACTIONATED)
Heparin Unfractionated: 0.2 IU/mL — ABNORMAL LOW (ref 0.30–0.70)
Heparin Unfractionated: <0.1 IU/mL — ABNORMAL LOW (ref 0.30–0.70)
Heparin Unfractionated: 0.22 IU/mL — ABNORMAL LOW (ref 0.30–0.70)

## 2019-08-24 LAB — RENAL FUNCTION PANEL
Albumin: 2.2 g/dL — ABNORMAL LOW (ref 3.5–5.0)
Albumin: 2.2 g/dL — ABNORMAL LOW (ref 3.5–5.0)
Anion gap: 7 (ref 5–15)
Anion gap: 10 (ref 5–15)
BUN: 21 mg/dL — ABNORMAL HIGH (ref 6–20)
BUN: 22 mg/dL — ABNORMAL HIGH (ref 6–20)
CO2: 25 mmol/L (ref 22–32)
CO2: 23 mmol/L (ref 22–32)
Calcium: 8.3 mg/dL — ABNORMAL LOW (ref 8.9–10.3)
Calcium: 8.4 mg/dL — ABNORMAL LOW (ref 8.9–10.3)
Chloride: 103 mmol/L (ref 98–111)
Chloride: 102 mmol/L (ref 98–111)
Creatinine, Ser: 0.93 mg/dL (ref 0.61–1.24)
Creatinine, Ser: 1.07 mg/dL (ref 0.61–1.24)
GFR calc Af Amer: >60 mL/min (ref >60)
GFR calc Af Amer: >60 mL/min (ref >60)
GFR calc non Af Amer: >60 mL/min (ref >60)
GFR calc non Af Amer: >60 mL/min (ref >60)
Glucose, Bld: 122 mg/dL — ABNORMAL HIGH (ref 70–99)
Glucose, Bld: 169 mg/dL — ABNORMAL HIGH (ref 70–99)
Phosphorus: 2.3 mg/dL — ABNORMAL LOW (ref 2.5–4.6)
Phosphorus: 2.8 mg/dL (ref 2.5–4.6)
Potassium: 3.9 mmol/L (ref 3.5–5.1)
Potassium: 3.9 mmol/L (ref 3.5–5.1)
Sodium: 135 mmol/L (ref 135–145)
Sodium: 135 mmol/L (ref 135–145)

## 2019-08-24 LAB — BASIC METABOLIC PANEL
Anion gap: 8 (ref 5–15)
BUN: 22 mg/dL — ABNORMAL HIGH (ref 6–20)
CO2: 24 mmol/L (ref 22–32)
Calcium: 8.2 mg/dL — ABNORMAL LOW (ref 8.9–10.3)
Chloride: 102 mmol/L (ref 98–111)
Creatinine, Ser: 1.04 mg/dL (ref 0.61–1.24)
GFR calc Af Amer: >60 mL/min (ref >60)
GFR calc non Af Amer: >60 mL/min (ref >60)
Glucose, Bld: 124 mg/dL — ABNORMAL HIGH (ref 70–99)
Potassium: 3.9 mmol/L (ref 3.5–5.1)
Sodium: 134 mmol/L — ABNORMAL LOW (ref 135–145)

## 2019-08-24 LAB — CBC
HCT: 30.1 % — ABNORMAL LOW (ref 39.0–52.0)
Hemoglobin: 8.5 g/dL — ABNORMAL LOW (ref 13.0–17.0)
MCH: 21 pg — ABNORMAL LOW (ref 26.0–34.0)
MCHC: 28.2 g/dL — ABNORMAL LOW (ref 30.0–36.0)
MCV: 74.3 fL — ABNORMAL LOW (ref 80.0–100.0)
Platelets: 147 10*3/uL — ABNORMAL LOW (ref 150–400)
RBC: 4.05 MIL/uL — ABNORMAL LOW (ref 4.22–5.81)
RDW: 20.2 % — ABNORMAL HIGH (ref 11.5–15.5)
WBC: 13.1 10*3/uL — ABNORMAL HIGH (ref 4.0–10.5)
nRBC: 0 % (ref 0.0–0.2)

## 2019-08-24 LAB — GLUCOSE, CAPILLARY
Glucose-Capillary: 121 mg/dL — ABNORMAL HIGH (ref 70–99)
Glucose-Capillary: 108 mg/dL — ABNORMAL HIGH (ref 70–99)
Glucose-Capillary: 76 mg/dL (ref 70–99)
Glucose-Capillary: 57 mg/dL — ABNORMAL LOW (ref 70–99)
Glucose-Capillary: 123 mg/dL — ABNORMAL HIGH (ref 70–99)
Glucose-Capillary: 60 mg/dL — ABNORMAL LOW (ref 70–99)
Glucose-Capillary: 125 mg/dL — ABNORMAL HIGH (ref 70–99)
Glucose-Capillary: 68 mg/dL — ABNORMAL LOW (ref 70–99)

## 2019-08-24 LAB — MAGNESIUM: Magnesium: 2.3 mg/dL (ref 1.7–2.4)

## 2019-08-24 MED ORDER — DEXTROSE 50 % IV SOLN
INTRAVENOUS | Status: AC
  Administered 2019-08-24: 25 mL
  Filled 2019-08-24: qty 50

## 2019-08-24 MED ORDER — DEXTROSE 5 % IV SOLN: INTRAVENOUS | Status: AC

## 2019-08-24 MED ORDER — DEXTROSE 50 % IV SOLN
25.0000 g | INTRAVENOUS | Status: AC
  Administered 2019-08-24: 15:00:00 25 g via INTRAVENOUS
  Filled 2019-08-24: qty 50

## 2019-08-24 MED ORDER — HEPARIN BOLUS VIA INFUSION
1050.0000 [IU] | Freq: Once | INTRAVENOUS | Status: AC
  Administered 2019-08-24: 20:00:00 1050 [IU] via INTRAVENOUS
  Filled 2019-08-24: qty 1050

## 2019-08-24 MED ORDER — SODIUM PHOSPHATES 45 MMOLE/15ML IV SOLN
20.0000 mmol | Freq: Once | INTRAVENOUS | Status: AC
  Administered 2019-08-24: 20 mmol via INTRAVENOUS
  Filled 2019-08-24: qty 6.67

## 2019-08-24 MED ORDER — DEXTROSE 5 % IV SOLN: INTRAVENOUS | Status: DC

## 2019-08-24 MED ORDER — HEPARIN BOLUS VIA INFUSION
2000.0000 [IU] | Freq: Once | INTRAVENOUS | Status: AC
  Administered 2019-08-24: 2000 [IU] via INTRAVENOUS
  Filled 2019-08-24: qty 2000

## 2019-08-24 MED ORDER — ORAL CARE MOUTH RINSE
15.0000 mL | Freq: Two times a day (BID) | OROMUCOSAL | Status: DC
  Administered 2019-08-25 – 2019-09-05 (×23): 15 mL via OROMUCOSAL

## 2019-08-24 MED ORDER — CHLORHEXIDINE GLUCONATE 0.12 % MT SOLN
15.0000 mL | Freq: Two times a day (BID) | OROMUCOSAL | Status: DC
  Administered 2019-08-24 – 2019-09-05 (×24): 15 mL via OROMUCOSAL
  Filled 2019-08-24 (×14): qty 15

## 2019-08-24 MED ORDER — DARBEPOETIN ALFA 100 MCG/0.5ML IJ SOSY
100.0000 ug | PREFILLED_SYRINGE | INTRAMUSCULAR | Status: DC
  Administered 2019-08-24 – 2019-08-31 (×2): 100 ug via SUBCUTANEOUS
  Filled 2019-08-24 (×2): qty 0.5

## 2019-08-24 MED ORDER — DEXTROSE 50 % IV SOLN
INTRAVENOUS | Status: AC
  Filled 2019-08-24: qty 50

## 2019-08-24 NOTE — Progress Notes (Signed)
Hypoglycemic Event  CBG: 60  Treatment: 1/2 amp D50%  Symptoms: none noted  Follow-up CBG: Time: 2035 CBG Result: 125  Possible Reasons for Event: NPO most of day, TF restarted recently  Comments/MD notified: Gavin Pound

## 2019-08-24 NOTE — Progress Notes (Signed)
Hypoglycemic Event  CBG: 68  Treatment: Notified ELink, orders for d5 @30ml /hr given.  Symptoms: none noted  Possible Reasons for Event: NPO most of day, TF started this afternoon  Comments/MD notified:ELink    Guadalupe Dawn

## 2019-08-24 NOTE — Procedures (Signed)
Extubation Procedure Note  Patient Details:   Name: Lance Cook DOB: [birth year] MRN: [id number]   Airway Documentation:    Vent end date: 08/24/19 Vent end time: 1055   Evaluation  O2 sats: stable throughout Complications: No apparent complications Patient did tolerate procedure well. Bilateral Breath Sounds: Clear, Diminished   Yes   Patient extubated to 4L [place] per MD order. Positive cuff leak. No stridor noted. BBS are rhonchus. Patient trying to cough, but weak. NTS orders placed if needed. RN at bedside. Vitals are stable.   Nethaniel Mattie H Filip Luten 08/24/2019, 11:00 AM

## 2019-08-24 NOTE — Progress Notes (Signed)
Rn SLP Cancellation Note  Patient Details Name: Lance Cook MRN: [id number] DOB: [birth year]   Cancelled treatment:        RN reported pt is weak, voice barely audible. RN working on getting NGT. Will follow up tomorrow.    Houston Siren 08/24/2019, 2:11 PM   Orbie Pyo Colvin Caroli.Ed Risk analyst [phone] Office [phone]

## 2019-08-24 NOTE — Progress Notes (Signed)
ANTICOAGULATION CONSULT NOTE - Follow Up Consult  Pharmacy Consult for Heparin Indication: Hx St Jude MVR/AVR + Afib  Patient Measurements: Height: 5' 2.21" (158 cm) Weight: 162 lb 7.7 oz (73.7 kg) IBW/kg (Calculated) : 55.07 Heparin Dosing Weight: 70 kg  Vital Signs: Temp: 98 F (36.7 C) (12/23 1132) Temp Source: Oral (12/23 1132) BP: 89/52 (12/23 1100) Pulse Rate: 90 (12/23 1100)  Labs: Recent Labs    08/22/19 0538 08/23/19 0213 08/23/19 1615 08/24/19 0301 08/24/19 1000  HGB 8.4* 8.4*  --  8.5*  --   HCT 30.0* 29.4*  --  30.1*  --   PLT 161 142*  --  147*  --   HEPARINUNFRC 0.39 0.33  --  0.20* <0.10*  CREATININE 0.85 1.00 1.17 1.04  0.93  --     Estimated Creatinine Clearance: 68.4 mL/min (by C-G formula based on SCr of 1.04 mg/dL).  Assessment: 62 YOM on warfarin PTA for hx St Jude MVR + Afib - admitted with AKI and hyperkalemia requiring line replacement and start of CRRT. FFP given and Heparin bridge started when INR<2.5. Pharmacy consulted to dose.   Heparin level undetectable s/p rate increase, observed rate, no infusion issues or stoppages today and lab drawn through [facility].    Goal of Therapy:  Heparin level 0.3-0.7 units/ml Monitor platelets by anticoagulation protocol: Yes   Plan:  Heparin 2000 units IV x 1, and increase heparin gtt to 2100 units/hr F/u 6 hour heparin level  Bertis Ruddy, PharmD Clinical Pharmacist Please check AMION for all [place] numbers 08/24/2019 12:52 PM

## 2019-08-24 NOTE — Progress Notes (Signed)
Hypoglycemic Event  CBG: 39 Left hand and 57 right hand   Treatment: 1 amp of D50  Symptoms: low BG and confused   Follow-up CBG: Time: 1548 CBG Result:123  Possible Reasons for Event: NPO, extubated at 11am   Comments/MD notified: Dr. Nelda Marseille  And Loree Fee NP informed. NG tubed place and tube feeds restarted. Will continue to monitor.    Lucien Budney A Kimi Kroft

## 2019-08-24 NOTE — Progress Notes (Signed)
[place] for Heparin Indication: Afib, AVR and MVR  Allergies  Allergen Reactions  . Feraheme [Ferumoxytol] Shortness Of Breath    SOB/Tachycardia  . Penicillins Hives and Rash    Has patient had a PCN reaction causing immediate rash, facial/tongue/throat swelling, SOB or lightheadedness with hypotension: Yes Has patient had a PCN reaction causing severe rash involving mucus membranes or skin necrosis: Yes Has patient had a PCN reaction that required hospitalization unsure Has patient had a PCN reaction occurring within the last 10 years: 2010-2012 If all of the above answers are "NO", then may proceed with Cephalosporin use.  [name] Other (See Comments)    CDIFF, when taking oral tablets   . Tape Other (See Comments) and Itching    Skin irritation    Patient Measurements: Height: 5' 2.21" (158 cm) Weight: 162 lb 7.7 oz (73.7 kg) IBW/kg (Calculated) : 55.07 Heparin Dosing Weight: 71kg  Vital Signs: Temp: 97.5 F (36.4 C) (12/23 0330) Temp Source: Axillary (12/23 0330) BP: 129/56 (12/23 0300) Pulse Rate: 73 (12/23 0330)  Labs: Recent Labs    08/21/19 0436 08/21/19 0615 08/21/19 0727 08/21/19 1152 08/21/19 1510 08/22/19 0538 08/23/19 0213 08/23/19 1615 08/24/19 0301  HGB 8.3*  --   --   --   --  8.4* 8.4*  --  8.5*  HCT 29.4*  --   --   --   --  30.0* 29.4*  --  30.1*  PLT 163  --   --   --   --  161 142*  --  147*  LABPROT 57.4* 51.7*  --  25.7*  --   --   --   --   --   INR 6.5* 5.7*  --  2.4*  --   --   --   --   --   HEPARINUNFRC 1.62*  --   --   --    < > 0.39 0.33  --  0.20*  CREATININE 0.73  --    < >  --   --  0.85 1.00 1.17 0.93   < > = values in this interval not displayed.    Estimated Creatinine Clearance: 76.5 mL/min (by C-G formula based on SCr of 0.93 mg/dL).  Assessment: 58 y.o. male with h/o Afib and AVR/MVR, Coumadin on hold, for heparin  Goal of Therapy:  Heparin level  0.3-0.7 units/ml Monitor platelets by anticoagulation protocol: Yes   Plan:  Increase Heparin 1850 units/hr Check heparin level in 6 hours.  Phillis Knack, PharmD, BCPS  08/24/2019,3:51 AM

## 2019-08-24 NOTE — Progress Notes (Addendum)
NAME:  Lance Cook, MRN:  [place]:[id number], DOB:  Aug 04, 1961, LOS: 6 ADMISSION DATE:  08/02/2019, CONSULTATION DATE:  08/17/2019 REFERRING MD:  Dr Sherry Ruffing, CHIEF COMPLAINT:  Hyperkalemia    Brief History   58 yo male found to have severe life-threatening hyperkalemia, PCCM consulted for admission and need for urgent iHD.  History of present illness   58 year old man, history of Bentall procedure, MVR, atrial fibrillation, CAD with chronic systolic and diastolic CHF.  Came to the ER with fatigue, weakness and hypotension.  He was found to be profoundly bradycardic with severe left bundle branch block and QRS widening on EKG.  Found to be severely hyperkalemic with new acute renal failure.  He is on digoxin at baseline and his digoxin level was 2.6.  He will be admitted emergently to the ICU for hemodialysis. He was given digibind for dig toxicity.   Significant Hospital Events   12/17 Admitted, urgent HD 12/18 had HD catheter in carotid removed in OR. Returned intubated  Consults:  Nephrology Heart Failure  Procedures:  R IJ HD catheter 12/17 >> Misplaced in carotid, removed in OR 12/18  L IJ HD catheter 12/17 >>   Significant Diagnostic Tests:  NM cardiac amyloid scan 10/21 >> equivocal for any evidence amyloidosis  ECHO 12/18 >  1. Left ventricular ejection fraction, by visual estimation, is 30 to 35%. The left ventricle has severely decreased function. Severely increased left ventricular size. There is no left ventricular hypertrophy.  2. Definity contrast agent was given IV to delineate the left ventricular endocardial borders.  3. Global hypokinesis, though function more preserved at base than apex. LV contrast excludes apical thrombus.  4. Global right ventricle has normal systolic function.The right ventricular size is normal. No increase in right ventricular wall thickness.  5. Left atrial size was normal.  6. Right atrial size was normal.  7. The mitral valve has been  repaired/replaced. Mild mitral valve regurgitation. Moderate mitral stenosis.  8. The tricuspid valve is normal in structure. Tricuspid valve regurgitation mild-moderate.  9. 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 10. The aortic valve is normal in structure. Aortic valve regurgitation is trivial by color flow Doppler. Structurally normal aortic valve, with no evidence of sclerosis or stenosis. 11. Aortic valve with trivial perivalvular leak. 12. The pulmonic valve was grossly normal. Pulmonic valve regurgitation is not visualized by color flow Doppler. 13. S/P Bentall. 14. Moderately elevated pulmonary artery systolic pressure. 15. A pacer wire is visualized in the RV and RA. 16. The inferior vena cava is dilated in size with <50% respiratory variability, suggesting right atrial pressure of 15 mmHg. 17. History of mechanical aortic and mitral valves. Leaflets not well visualized, could consider fluroscopy of leaflets, but gradients not suggestive of obstruction.  CXR 12/17 > New left internal jugular central line tip in the SVC above the right atrium. No pneumothorax. No other change.  Micro Data:  COVID 12/17 > negative  Blood cultures 12/17 >  MRSA PCR 12/17 >negative  Urine culture 12/18 >  Negative  Antimicrobials:    Interim history/subjective:  No acute events overnight, currently weaning well.   Objective   Blood pressure 95/72, pulse 78, temperature 98.2 F (36.8 C), temperature source Oral, resp. rate 17, height 5' 2.21" (1.58 m), weight 73.7 kg, SpO2 100 %. CVP:  [8 mmHg-16 mmHg] 8 mmHg  Vent Mode: PSV;CPAP FiO2 (%):  [40 %] 40 % Set Rate:  [25 bmp] 25 bmp Vt Set:  [420 mL] 420 mL  PEEP:  [5 cmH20] 5 cmH20 Pressure Support:  [10 cmH20] 10 cmH20 Plateau Pressure:  [15 cmH20-20 cmH20] 15 cmH20   Intake/Output Summary (Last 24 hours) at 08/24/2019 0847 Last data filed at 08/24/2019 0800 Gross per 24 hour  Intake 4090.02 ml  Output 6545 ml  Net -2454.98  ml   Filed Weights   08/22/19 0500 08/23/19 0237 08/24/19 0310  Weight: 77.2 kg 74.9 kg 73.7 kg    Examination: General: Chronically ill appearing elderly male on mechanical ventilation, in NAD HEENT: ETT, MM pink/moist, PERRL,  Neuro: Alert and able to follow simple commands on vent this morning, non-focal  CV: s1s2 regular rate and rhythm, no murmur, rubs, or gallops,  PULM:  Clear to ascultation bilaterally, no added breath sounds  GI: soft, bowel sounds active in all 4 quadrants, non-tender, non-distended, tolerating TF Extremities: warm/dry, 2+ edema  Skin: no rashes or lesions  Resolved Hospital Problem list   Coagulopathy with elevated INR  Assessment & Plan:  Lance Cook is a 58 yo gentleman with significant cardiac history including HFrEF, s/p MVR, s/p Bentall procedure who presents with:  Acute hypoxemic respiratory failure - intubated in the OR - failed SBT for apnea. He is delirious.  Plan: Currently tolerating SBT well, can hopefully extubate later today  Continue ventilator support with lung protective strategies  Wean PEEP and FiO2 for sats greater than 90%. Head of bed elevated 30 degrees. Plateau pressures less than 30 cm H20.  Ensure adequate pulmonary hygiene  Follow cultures  VAP bundle in place  PAD protocol  Acute Encephalopathy - In the setting of delirium, metabolic, digoxin toxicity, renal failure.  - Improving  Plan: Precedex for agitation, wean off if extubated  Minimize sedation  Delirium precautions   Digoxin Toxicity - level on 12/18 was 9. Was s/p 2 units digibind, now down to 4.1 Plan: Dig levels downtrending  No indications for further dosing of digibind at this time  Unable to utilize dig in the future   AKI superimposed on CKD stage 3 Severe life-threatening hyperkalemia  -Due to acute on chronic (stage II-III) renal failure.  Etiology of his renal failure likely includes use of diuretics, poor p.o. intake.  He is on potassium  supplementation as well. Also digoxin Plan Nephrology following, appreciate assistance CRRT per nephrology  Continue pressor support to facilitate volume removal  Trend Bmet  Follow renal function and urine output   Cardiogenic Shock Acute on Chronic systolic and diastolic CHF with CAD Mechanical mitral valve,  Chronic atrial fibrillation AICD/pacer.  -Admitted with severe left bundle branch block, wide-complex EKG, enlarged T waves, severe bradycardia, -Baseline anticoagulation with coumadin  -Suspect wide complex tachycardia secondary to acute electrolyte abnormalities in the setting of heart disease, also with digoxin toxicity. Per HF note his LV lead is not currently on.  Plan: Heart failure team following, appreciate assistance  Continue Heparin drip  CRRT per nephrology  Hold aldactone and lopressor  Continue IV amiodarone and levophed   DM type 2 Plan SSI   Best practice:  Diet: tube feeds advance as tolerated.  Pain/Anxiety/Delirium protocol (if indicated): Precedex and PRN fentanyl  VAP protocol (if indicated): In place  DVT prophylaxis: heparin gtt GI prophylaxis: PPI Glucose control: SI per protocol Mobility: BR Code Status: Full Family Communication: discussed with pt 12/17 Disposition: ICU  Labs   CBC: Recent Labs  Lab 08/21/2019 1424 08/27/2019 0048 08/20/19 0129 08/21/19 0436 08/22/19 0538 08/23/19 0213 08/24/19 0301  WBC 19.1* 25.3*  --  9.4 10.0 11.7* 13.1*  NEUTROABS 17.0*  --   --   --   --   --   --   HGB 11.2*  9.6*  11.2* 11.0* 10.9* 8.3* 8.4* 8.4* 8.5*  HCT 33.0*  32.7*  33.0* 36.8* 32.0* 29.4* 30.0* 29.4* 30.1*  MCV 71.1* 70.5*  --  74.6* 74.6* 74.2* 74.3*  PLT 233 221  --  163 161 142* 147*    Basic Metabolic Panel: Recent Labs  Lab 08/20/19 0341 08/21/19 0436 08/22/19 0538 08/22/19 1639 08/23/19 0213 08/23/19 1615 08/24/19 0301  NA 131* 135 136 137 138 137 134*  135  K 4.5 3.9 4.0 3.9 4.0 3.7 3.9  3.9  CL 96* 103 103  107 104 104 102  103  CO2 26 25 26 25 24 24 24  25   GLUCOSE 135* 104* 107* 109* 107* 131* 124*  122*  BUN 34* 19 14 14 14 16  22*  21*  CREATININE 1.00 0.73 0.85 1.01 1.00 1.17 1.04  0.93  CALCIUM 8.3* 7.5* 8.3* 7.8* 8.2* 7.8* 8.2*  8.3*  MG 2.5* 2.3 2.4  --  2.6*  --  2.3  PHOS 3.3 1.6* 1.8* 2.5 2.1* 2.9 2.3*   GFR: Estimated Creatinine Clearance: 68.4 mL/min (by C-G formula based on SCr of 1.04 mg/dL). Recent Labs  Lab 08/31/2019 1424 08/22/2019 1624 08/21/19 0436 08/22/19 0538 08/23/19 0213 08/24/19 0301  WBC 19.1*  --  9.4 10.0 11.7* 13.1*  LATICACIDVEN 1.8 2.0*  --   --   --   --     Liver Function Tests: Recent Labs  Lab 08/11/2019 0048 09/01/2019 0401 08/22/19 0538 08/22/19 1639 08/23/19 0213 08/23/19 1615 08/24/19 0301  AST 34 25  --   --   --   --   --   ALT 16 15  --   --   --   --   --   ALKPHOS 54 59  --   --   --   --   --   BILITOT 0.5 1.0  --   --   --   --   --   PROT 5.3* 5.4*  --   --   --   --   --   ALBUMIN 2.3* 2.4* 2.3* 2.2* 2.3* 2.1* 2.2*   No results for input(s): LIPASE, AMYLASE in the last 168 hours. No results for input(s): AMMONIA in the last 168 hours.  ABG    Component Value Date/Time   PHART 7.358 08/20/2019 0129   PCO2ART 53.7 (H) 08/20/2019 0129   PO2ART 95.0 08/20/2019 0129   HCO3 30.4 (H) 08/20/2019 0129   TCO2 32 08/20/2019 0129   ACIDBASEDEF 1.0 01/26/2015 0425   O2SAT 73.6 08/24/2019 0301     Coagulation Profile: Recent Labs  Lab 08/26/2019 0556 08/26/2019 1234 08/21/19 0436 08/21/19 0615 08/21/19 1152  INR 2.4* 2.2* 6.5* 5.7* 2.4*    Cardiac Enzymes: No results for input(s): CKTOTAL, CKMB, CKMBINDEX, TROPONINI in the last 168 hours.  HbA1C: Hgb A1c MFr Bld  Date/Time Value Ref Range Status  02/14/2019 06:08 AM 5.2 4.8 - 5.6 % Final    Comment:    (NOTE) Pre diabetes:          5.7%-6.4% Diabetes:              >6.4% Glycemic control for   <7.0% adults with diabetes   01/22/2015 12:43 PM 5.9 (H) 4.8 - 5.6 %  Final    Comment:    (  NOTE)         Pre-diabetes: 5.7 - 6.4         Diabetes: >6.4         Glycemic control for adults with diabetes: <7.0     CBG: Recent Labs  Lab 08/23/19 1536 08/23/19 1937 08/23/19 2307 08/24/19 0301 08/24/19 0738  GLUCAP 123* 114* 114* 121* 108*    Critical care time: 65  CRITICAL CARE Performed by: [name]   Total critical care time:  39 minutes  Critical care time was exclusive of separately billable procedures and treating other patients.  Critical care was necessary to treat or prevent imminent or life-threatening deterioration.  Critical care was time spent personally by me on the following activities: development of treatment plan with patient and/or surrogate as well as nursing, discussions with consultants, evaluation of patient's response to treatment, examination of patient, obtaining history from patient or surrogate, ordering and performing treatments and interventions, ordering and review of laboratory studies, ordering and review of radiographic studies, pulse oximetry and re-evaluation of patient's condition.  [name], NP-C [facility] Contact / Pager information can be found on Amion  08/24/2019, 8:47 AM  Attending Note:  58 year old male with PMH of CHF who presents with hyperkalemia and cardiogenic shock.  CRRT started.  No events overnight, weaning well on exam with coarse BS diffusely.  I reviewed CXR myself, ETT is in a good position.  Discussed with PCCM-NP.  Will plan to trial extubate today.  Place NGT for TF.  Continue CRRT with negative 100 ml/hr.  Levophed for BP support.  Monitor CVP.  PCCM will continue to follow.  The patient is critically ill with multiple organ systems failure and requires high complexity decision making for assessment and support, frequent evaluation and titration of therapies, application of advanced monitoring technologies and extensive interpretation of multiple  databases.   Critical Care Time devoted to patient care services described in this note is  32  Minutes. This time reflects time of care of this signee Dr Jennet Maduro. This critical care time does not reflect procedure time, or teaching time or supervisory time of PA/NP/Med student/Med Resident etc but could involve care discussion time.  Rush Farmer, M.D. [facility].

## 2019-08-24 NOTE — Progress Notes (Signed)
Advanced Heart Failure Rounding Note   Subjective:    Awake on vent. Will follow commands. Very weak.   Now on NE at 8. Remains on CVVHD. Weight down another 3 pounds. (weight down total 33 pounds)  CVP 8 Co-ox 74%  Objective:   Weight Range:  Vital Signs:   Temp:  [97.5 F (36.4 C)-101.5 F (38.6 C)] 98.2 F (36.8 C) (12/23 0758) Pulse Rate:  [64-102] 78 (12/23 0800) Resp:  [14-34] 17 (12/23 0800) BP: (79-136)/(17-72) 95/72 (12/23 0800) SpO2:  [92 %-100 %] 100 % (12/23 0800) Arterial Line BP: (89-145)/(41-63) 109/46 (12/23 0800) FiO2 (%):  [40 %] 40 % (12/23 0800) Weight:  [73.7 kg] 73.7 kg (12/23 0310) Last BM Date: (PTA)  Weight change: Filed Weights   08/22/19 0500 08/23/19 0237 08/24/19 0310  Weight: 77.2 kg 74.9 kg 73.7 kg    Intake/Output:   Intake/Output Summary (Last 24 hours) at 08/24/2019 0818 Last data filed at 08/24/2019 0800 Gross per 24 hour  Intake 4090.02 ml  Output 6264 ml  Net -2173.98 ml     Physical Exam: General:  Chronically ill appearing and weak Struggling on the vent with wean   HEENT: normal + ETT Neck: supple. RIJ wound ok LIJ trialysis . Carotids 2+ bilat; no bruits. No lymphadenopathy or thryomegaly appreciated. Cor: PMI nondisplaced. Irregular rate & rhythm.Mech s1/s2 2/6 SEM at LUSB Lungs: coarse Abdomen: soft, nontender, nondistended. No hepatosplenomegaly. No bruits or masses. Good bowel sounds. Extremities: no cyanosis, clubbing, rash, 1-2+ edema Neuro: awake on vent. Follows commands  Telemetry: AF 70-80s with PVCs. Personally reviewed    Labs: Basic Metabolic Panel: Recent Labs  Lab 08/20/19 0341 08/21/19 0436 08/22/19 0538 08/22/19 1639 08/23/19 0213 08/23/19 1615 08/24/19 0301  NA 131* 135 136 137 138 137 134*  135  K 4.5 3.9 4.0 3.9 4.0 3.7 3.9  3.9  CL 96* 103 103 107 104 104 102  103  CO2 26 25 26 25 24 24 24  25   GLUCOSE 135* 104* 107* 109* 107* 131* 124*  122*  BUN 34* 19 14 14 14 16  22*   21*  CREATININE 1.00 0.73 0.85 1.01 1.00 1.17 1.04  0.93  CALCIUM 8.3* 7.5* 8.3* 7.8* 8.2* 7.8* 8.2*  8.3*  MG 2.5* 2.3 2.4  --  2.6*  --  2.3  PHOS 3.3 1.6* 1.8* 2.5 2.1* 2.9 2.3*    Liver Function Tests: Recent Labs  Lab 08/30/2019 0048 08/03/2019 0401 08/22/19 0538 08/22/19 1639 08/23/19 0213 08/23/19 1615 08/24/19 0301  AST 34 25  --   --   --   --   --   ALT 16 15  --   --   --   --   --   ALKPHOS 54 59  --   --   --   --   --   BILITOT 0.5 1.0  --   --   --   --   --   PROT 5.3* 5.4*  --   --   --   --   --   ALBUMIN 2.3* 2.4* 2.3* 2.2* 2.3* 2.1* 2.2*   No results for input(s): LIPASE, AMYLASE in the last 168 hours. No results for input(s): AMMONIA in the last 168 hours.  CBC: Recent Labs  Lab 08/07/2019 1424 08/30/2019 0048 08/20/19 0129 08/21/19 0436 08/22/19 0538 08/23/19 0213 08/24/19 0301  WBC 19.1* 25.3*  --  9.4 10.0 11.7* 13.1*  NEUTROABS 17.0*  --   --   --   --   --   --  HGB 11.2*  9.6*  11.2* 11.0* 10.9* 8.3* 8.4* 8.4* 8.5*  HCT 33.0*  32.7*  33.0* 36.8* 32.0* 29.4* 30.0* 29.4* 30.1*  MCV 71.1* 70.5*  --  74.6* 74.6* 74.2* 74.3*  PLT 233 221  --  163 161 142* 147*    Cardiac Enzymes: No results for input(s): CKTOTAL, CKMB, CKMBINDEX, TROPONINI in the last 168 hours.  BNP: BNP (last 3 results) Recent Labs    07/19/19 0956 08/02/19 0957 08/29/2019 1424  BNP 298.7* 167.3* 106.1*    ProBNP (last 3 results) No results for input(s): PROBNP in the last 8760 hours.    Other results:  Imaging: No results found.   Medications:     Scheduled Medications: . albuterol  2.5 mg Nebulization TID  . amiodarone  150 mg Intravenous Once  . chlorhexidine gluconate (MEDLINE KIT)  15 mL Mouth Rinse BID  . Chlorhexidine Gluconate Cloth  6 each Topical Q0600  . darbepoetin (ARANESP) injection - NON-DIALYSIS  100 mcg Subcutaneous Q Wed-1800  . feeding supplement (NEPRO CARB STEADY)  1,000 mL Oral Q24H  . feeding supplement (PRO-STAT SUGAR FREE 64)   30 mL Per Tube TID  . hydroxypropyl methylcellulose / hypromellose  1 drop Both Eyes TID  . insulin aspart  2-6 Units Subcutaneous Q4H  . mouth rinse  15 mL Mouth Rinse 10 times per day  . methocarbamol  500 mg Per Tube TID  . pantoprazole sodium  40 mg Per Tube Daily  . polyethylene glycol  17 g Oral Daily    Infusions: .  prismasol BGK 4/2.5 500 mL/hr at 08/24/19 0039  .  prismasol BGK 4/2.5 300 mL/hr at 08/24/19 0038  . sodium chloride 10 mL/hr at 08/24/19 0800  . amiodarone 30 mg/hr (08/24/19 0800)  . dexmedetomidine (PRECEDEX) IV infusion 0.5 mcg/kg/hr (08/24/19 0800)  . fentaNYL infusion INTRAVENOUS 75 mcg/hr (08/24/19 0816)  . heparin 1,850 Units/hr (08/24/19 0354)  . norepinephrine (LEVOPHED) Adult infusion 8 mcg/min (08/24/19 0646)  . prismasol BGK 4/2.5 1,500 mL/hr at 08/24/19 0746  . sodium phosphate  Dextrose 5% IVPB 20 mmol (08/24/19 0803)    PRN Medications: sodium chloride, bisacodyl, fentaNYL, fentaNYL (SUBLIMAZE) injection, fentaNYL (SUBLIMAZE) injection, heparin, oxyCODONE, sennosides   Assessment/Plan:   1. AKI on CKD stage 3: Creatinine up to 2.7 at admission with marked hyperkalemia and hyponatremia in setting of use of torsemide, metolazone, and spironolactone at home.  He had urgent HD with improvement.  - Remains on CVVHD pulling 100-150/hr - Remains volume overloaded  CVP 14. Would use NE to support BP to pull more fluid today - Renal following 2. Acute on chronic systolic CHF/cardiogenic shock: EF 30-35% on last echo.  [employer] CRT-D, LV not on due to diaphragmatic stimulation.  Hypotension in setting of AKI/hyperkalemia. NE remains at 3   Marked volume overload improving with CVVHD and improving u/o - Co-ox 74% on NE8.  Use NE to support UF - Volume status improved but still with some overload. Continue CVVH for volume removal probably one more day.  Baseline weight about 80kg (currently 74 kg but CVP still up and has significant peripheral  edema) 3. VT: Patient has [employer] CRT-D device, LV lead off due to diaphragmatic pacing.  Patient has has runs of VT, now on amiodarone gtt.  - VT quiescent - Keep K > 4.0 and Mg > 2.0 (K 4.0 and MG 2.6 today) 4. Digoxin toxicity: Severely elevated digoxin level, has had digibind and HD.  - bradycardia improved -  dig level falling. No need for repeat digibind as long as clinically stable 5. CAD: Last cath in 2016 with patent native coronaries, probably occluded SVG-RCA.  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  - Continue heparin Hgb stable 8.57. Atrial fibrillation: Chronic.   - rate controlled 80-90s - heparin as above  - continu eIV amio for now 8. Acute hypoxic respiratory failure - remains on vent. Weaning now but c/b but significant weakness    He remains critically ill and very weak. Struggling to wean from vent. Continue to support. If not making progress may need to re-address GOC.  D/w CCM team    CRITICAL CARE Performed by: Glori Bickers  Total critical care time: 35 minutes  Critical care time was exclusive of separately billable procedures and treating other patients.  Critical care was necessary to treat or prevent imminent or life-threatening deterioration.  Critical care was time spent personally by me (independent of midlevel providers or residents) on the following activities: development of treatment plan with patient and/or surrogate as well as nursing, discussions with consultants, evaluation of patient's response to treatment, examination of patient, obtaining history from patient or surrogate, ordering and performing treatments and interventions, ordering and review of laboratory studies, ordering and review of radiographic studies, pulse oximetry and re-evaluation of patient's condition.    Length of Stay: 6   Glori Bickers MD 08/24/2019, 8:18 AM  Advanced Heart Failure Team Pager [phone] (M-F; [place])  Please  contact [facility] for night-coverage after hours (4p -7a ) and weekends on [email or web address]

## 2019-08-24 NOTE — Telephone Encounter (Signed)
Dr Haroldine Laws rounded on pt today as Dr [name] is on vacation, sent him a message to call pt's wife.

## 2019-08-24 NOTE — Progress Notes (Signed)
eLink Physician-Brief Progress Note Patient Name: Aquino Galo DOB: 03-Oct-1960 MRN: [id number]   Date of Service  08/24/2019  HPI/Events of Note  Blood sugar 60-68 mg % despite Nepro enteral  infusion @ 40 ml/ hour  eICU Interventions  D5 Water infusion started at 30 ml/ hour x 24 hours        Neave Lenger U Iviana Blasingame 08/24/2019, 11:59 PM

## 2019-08-24 NOTE — Progress Notes (Signed)
[facility] NEPHROLOGY PROGRESS NOTE  Assessment/ Plan: Pt is a 58 y.o. yo male CHF, mechanical mitral and aortic valve A. fib, VT arrest, DM, potassium of 8.4 associated with hypotension and AKI on admission.  #Severe hyperkalemia with EKG changes due to use of spironolactone, KCl and AKI: Received urgent dialysis on admission, started CRRT on 12/18 for fluid overload.   Potassium level now normal on RRT.  #Acute on CKD stage III: Baseline creatinine around 1.3-1.6, likely ischemic ATN due to shock.  On CRRT with total UF of around 2.2 L in last 24 hours.  On IV heparin, 4K bath. CVP is 13-16 with fluid overload.  Plan to continue CRRT today to optimize volume status since still on pressors and minimal UOP.  UF around 100 to 150 cc/hour.   #Acute on chronic hyponatremia: Probably due to hypervolemia/diuretics.  He was on metolazone, torsemide and Aldactone at home.  Sodium level  improved.  #Acute on Chronic CHF, aortic and mitral valve replacement: Per cardiology.  UF with CRRT.  #Hypotension/shock: On Levophed. Still requiring  #Acute respiratory failure/VDRF: Intubated, per PCCM. Per nursing possible extubation today  [place]-  K close to 4-  Phos 2.3- will give some repletion today   Anemia- check iron stores and give ESA     Subjective: Seen and examined at bedside.  Tolerating UF during CRRT.  No events.  Urine output is recorded only 90 cc.  He is sitting up with his glasses on, maybe alert but also needing mittens   Objective Vital signs in last 24 hours: Vitals:   08/24/19 0515 08/24/19 0530 08/24/19 0545 08/24/19 0600  BP:    (!) 116/50  Pulse: 64 67 65 69  Resp: 16 (!) 28 (!) 23 18  Temp:      TempSrc:      SpO2: 100% 100% 100% 100%  Weight:      Height:       Weight change: -1.2 kg  Intake/Output Summary (Last 24 hours) at 08/24/2019 0623 Last data filed at 08/24/2019 0600 Gross per 24 hour  Intake 4080.74 ml  Output 6310 ml  Net -2229.26  ml       Labs: Basic Metabolic Panel: Recent Labs  Lab 08/23/19 0213 08/23/19 1615 08/24/19 0301  NA 138 137 134*  135  K 4.0 3.7 3.9  3.9  CL 104 104 102  103  CO2 24 24 24  25   GLUCOSE 107* 131* 124*  122*  BUN 14 16 22*  21*  CREATININE 1.00 1.17 1.04  0.93  CALCIUM 8.2* 7.8* 8.2*  8.3*  PHOS 2.1* 2.9 2.3*   Liver Function Tests: Recent Labs  Lab 08/20/2019 0048 08/20/2019 0401 08/23/19 0213 08/23/19 1615 08/24/19 0301  AST 34 25  --   --   --   ALT 16 15  --   --   --   ALKPHOS 54 59  --   --   --   BILITOT 0.5 1.0  --   --   --   PROT 5.3* 5.4*  --   --   --   ALBUMIN 2.3* 2.4* 2.3* 2.1* 2.2*   No results for input(s): LIPASE, AMYLASE in the last 168 hours. No results for input(s): AMMONIA in the last 168 hours. CBC: Recent Labs  Lab 08/30/2019 1424 08/29/2019 0048 08/21/19 0436 08/22/19 0538 08/23/19 0213 08/24/19 0301  WBC 19.1* 25.3* 9.4 10.0 11.7* 13.1*  NEUTROABS 17.0*  --   --   --   --   --  HGB 11.2*  9.6*  11.2* 11.0* 8.3* 8.4* 8.4* 8.5*  HCT 33.0*  32.7*  33.0* 36.8* 29.4* 30.0* 29.4* 30.1*  MCV 71.1* 70.5* 74.6* 74.6* 74.2* 74.3*  PLT 233 221 163 161 142* 147*   Cardiac Enzymes: No results for input(s): CKTOTAL, CKMB, CKMBINDEX, TROPONINI in the last 168 hours. CBG: Recent Labs  Lab 08/23/19 1138 08/23/19 1536 08/23/19 1937 08/23/19 2307 08/24/19 0301  GLUCAP 103* 123* 114* 114* 121*    Iron Studies: No results for input(s): IRON, TIBC, TRANSFERRIN, FERRITIN in the last 72 hours. Studies/Results: No results found.  Medications: Infusions: .  prismasol BGK 4/2.5 500 mL/hr at 08/24/19 0039  .  prismasol BGK 4/2.5 300 mL/hr at 08/24/19 0038  . sodium chloride 10 mL/hr at 08/24/19 0600  . amiodarone 30 mg/hr (08/24/19 0600)  . dexmedetomidine (PRECEDEX) IV infusion 1 mcg/kg/hr (08/24/19 0600)  . fentaNYL infusion INTRAVENOUS 150 mcg/hr (08/24/19 0600)  . heparin 1,850 Units/hr (08/24/19 0354)  . norepinephrine  (LEVOPHED) Adult infusion 11 mcg/min (08/24/19 0512)  . prismasol BGK 4/2.5 1,500 mL/hr at 08/24/19 0355    Scheduled Medications: . albuterol  2.5 mg Nebulization TID  . amiodarone  150 mg Intravenous Once  . chlorhexidine gluconate (MEDLINE KIT)  15 mL Mouth Rinse BID  . Chlorhexidine Gluconate Cloth  6 each Topical Q0600  . feeding supplement (NEPRO CARB STEADY)  1,000 mL Oral Q24H  . feeding supplement (PRO-STAT SUGAR FREE 64)  30 mL Per Tube TID  . hydroxypropyl methylcellulose / hypromellose  1 drop Both Eyes TID  . insulin aspart  2-6 Units Subcutaneous Q4H  . mouth rinse  15 mL Mouth Rinse 10 times per day  . methocarbamol  500 mg Per Tube TID  . pantoprazole sodium  40 mg Per Tube Daily  . polyethylene glycol  17 g Oral Daily    have reviewed scheduled and prn medications.  Physical Exam: General: sitting up with glasses on-  Nods but not consistent commands, mittens Heart:RRR, s1s2 nl Lungs: Coarse breath sound bilateral Abdomen:soft,  non-distended Extremities: LE edema + pitting to dep areas Dialysis Access: Left IJ temporary catheter. In place for 5 days   Marcy Sookdeo A Rubie Ficco 08/24/2019,6:23 AM  LOS: 6 days

## 2019-08-24 NOTE — Progress Notes (Signed)
ANTICOAGULATION CONSULT NOTE - Follow Up Consult  Pharmacy Consult for Heparin Indication: Hx St Jude MVR/AVR + Afib  Patient Measurements: Height: 5' 2.21" (158 cm) Weight: 162 lb 7.7 oz (73.7 kg) IBW/kg (Calculated) : 55.07 Heparin Dosing Weight: 70 kg  Vital Signs: Temp: 97.8 F (36.6 C) (12/23 1539) Temp Source: Oral (12/23 1539) BP: 118/55 (12/23 1900) Pulse Rate: 88 (12/23 1900)  Labs: Recent Labs    08/22/19 0538 08/23/19 0213 08/23/19 1615 08/24/19 0301 08/24/19 1000 08/24/19 1640 08/24/19 1900  HGB 8.4* 8.4*  --  8.5*  --   --   --   HCT 30.0* 29.4*  --  30.1*  --   --   --   PLT 161 142*  --  147*  --   --   --   HEPARINUNFRC 0.39 0.33  --  0.20* <0.10*  --  0.22*  CREATININE 0.85 1.00 1.17 1.04  0.93  --  1.07  --     Estimated Creatinine Clearance: 66.5 mL/min (by C-G formula based on SCr of 1.07 mg/dL).  Assessment: 58 yr old male on warfarin PTA for hx St Jude MVR + Afib - admitted with AKI and hyperkalemia requiring line replacement and start of CRRT. FFP given and heparin bridge started when INR<2.5. Pharmacy consulted to dose heparin.   Heparin level ~6 hrs after heparin 2000 units IV bolus X 1, followed by increasing heparin infusion to 2100 units/hr, is 0.22 units/ml, which is below the goal range for this pt. H/H 8.5/30.1, platelets 147. Per RN, no issues with IV or bleeding observed.   Goal of Therapy:  Heparin level 0.3-0.7 units/ml Monitor platelets by anticoagulation protocol: Yes   Plan:  Heparin 1050 units IV bolus X 1 Increase heparin infusion to 2250 units/hr Check 6-hr heparin level Monitor daily heparin level, CBC Monitor for signs/symptoms of bleeding  Gillermina Hu, PharmD, BCPS, [facility] Clinical Pharmacist 08/24/19, 19:48 PM

## 2019-08-24 NOTE — Telephone Encounter (Signed)
New message   Patient currently admitted to [facility]. Spouse is calling to request conversation with Dr [name] or [place] team  to discuss high sodium meals. Spouse states she also has additional questions regarding overall state of health of the patient.  Please call

## 2019-08-25 DIAGNOSIS — I959 Hypotension, unspecified: Secondary | ICD-10-CM

## 2019-08-25 LAB — CBC
HCT: 29 % — ABNORMAL LOW (ref 39.0–52.0)
Hemoglobin: 8.1 g/dL — ABNORMAL LOW (ref 13.0–17.0)
MCH: 20.9 pg — ABNORMAL LOW (ref 26.0–34.0)
MCHC: 27.9 g/dL — ABNORMAL LOW (ref 30.0–36.0)
MCV: 74.7 fL — ABNORMAL LOW (ref 80.0–100.0)
Platelets: 126 10*3/uL — ABNORMAL LOW (ref 150–400)
RBC: 3.88 MIL/uL — ABNORMAL LOW (ref 4.22–5.81)
RDW: 20.4 % — ABNORMAL HIGH (ref 11.5–15.5)
WBC: 12.2 10*3/uL — ABNORMAL HIGH (ref 4.0–10.5)
nRBC: 0.2 % (ref 0.0–0.2)

## 2019-08-25 LAB — MAGNESIUM: Magnesium: 2.4 mg/dL (ref 1.7–2.4)

## 2019-08-25 LAB — GLUCOSE, CAPILLARY
Glucose-Capillary: 111 mg/dL — ABNORMAL HIGH (ref 70–99)
Glucose-Capillary: 128 mg/dL — ABNORMAL HIGH (ref 70–99)
Glucose-Capillary: 131 mg/dL — ABNORMAL HIGH (ref 70–99)
Glucose-Capillary: 139 mg/dL — ABNORMAL HIGH (ref 70–99)
Glucose-Capillary: 148 mg/dL — ABNORMAL HIGH (ref 70–99)
Glucose-Capillary: 56 mg/dL — ABNORMAL LOW (ref 70–99)
Glucose-Capillary: 70 mg/dL (ref 70–99)

## 2019-08-25 LAB — RENAL FUNCTION PANEL
Albumin: 2.2 g/dL — ABNORMAL LOW (ref 3.5–5.0)
Albumin: 2.1 g/dL — ABNORMAL LOW (ref 3.5–5.0)
Anion gap: 9 (ref 5–15)
Anion gap: 8 (ref 5–15)
BUN: 24 mg/dL — ABNORMAL HIGH (ref 6–20)
BUN: 35 mg/dL — ABNORMAL HIGH (ref 6–20)
CO2: 26 mmol/L (ref 22–32)
CO2: 25 mmol/L (ref 22–32)
Calcium: 8.5 mg/dL — ABNORMAL LOW (ref 8.9–10.3)
Calcium: 8 mg/dL — ABNORMAL LOW (ref 8.9–10.3)
Chloride: 101 mmol/L (ref 98–111)
Chloride: 102 mmol/L (ref 98–111)
Creatinine, Ser: 0.98 mg/dL (ref 0.61–1.24)
Creatinine, Ser: 1.46 mg/dL — ABNORMAL HIGH (ref 0.61–1.24)
GFR calc Af Amer: >60 mL/min (ref >60)
GFR calc Af Amer: >60 mL/min (ref >60)
GFR calc non Af Amer: >60 mL/min (ref >60)
GFR calc non Af Amer: 52 mL/min — ABNORMAL LOW (ref >60)
Glucose, Bld: 122 mg/dL — ABNORMAL HIGH (ref 70–99)
Glucose, Bld: 113 mg/dL — ABNORMAL HIGH (ref 70–99)
Phosphorus: 1.9 mg/dL — ABNORMAL LOW (ref 2.5–4.6)
Phosphorus: 3.6 mg/dL (ref 2.5–4.6)
Potassium: 3.9 mmol/L (ref 3.5–5.1)
Potassium: 4 mmol/L (ref 3.5–5.1)
Sodium: 136 mmol/L (ref 135–145)
Sodium: 135 mmol/L (ref 135–145)

## 2019-08-25 LAB — BASIC METABOLIC PANEL
Anion gap: 9 (ref 5–15)
BUN: 23 mg/dL — ABNORMAL HIGH (ref 6–20)
CO2: 26 mmol/L (ref 22–32)
Calcium: 8.5 mg/dL — ABNORMAL LOW (ref 8.9–10.3)
Chloride: 102 mmol/L (ref 98–111)
Creatinine, Ser: 0.97 mg/dL (ref 0.61–1.24)
GFR calc Af Amer: >60 mL/min (ref >60)
GFR calc non Af Amer: >60 mL/min (ref >60)
Glucose, Bld: 124 mg/dL — ABNORMAL HIGH (ref 70–99)
Potassium: 3.9 mmol/L (ref 3.5–5.1)
Sodium: 137 mmol/L (ref 135–145)

## 2019-08-25 LAB — COOXEMETRY PANEL
Carboxyhemoglobin: 1.7 % — ABNORMAL HIGH (ref 0.5–1.5)
Methemoglobin: 1 % (ref 0.0–1.5)
O2 Saturation: 76.4 %
Total hemoglobin: 8.1 g/dL — ABNORMAL LOW (ref 12.0–16.0)

## 2019-08-25 LAB — HEPARIN LEVEL (UNFRACTIONATED)
Heparin Unfractionated: 0.22 IU/mL — ABNORMAL LOW (ref 0.30–0.70)
Heparin Unfractionated: 0.25 IU/mL — ABNORMAL LOW (ref 0.30–0.70)

## 2019-08-25 LAB — IRON AND TIBC
Iron: 9 ug/dL — ABNORMAL LOW (ref 45–182)
Saturation Ratios: 3 % — ABNORMAL LOW (ref 17.9–39.5)
TIBC: 262 ug/dL (ref 250–450)
UIBC: 253 ug/dL

## 2019-08-25 LAB — PHOSPHORUS: Phosphorus: 2 mg/dL — ABNORMAL LOW (ref 2.5–4.6)

## 2019-08-25 LAB — FERRITIN: Ferritin: 86 ng/mL (ref 24–336)

## 2019-08-25 MED ORDER — MAGNESIUM HYDROXIDE 400 MG/5ML PO SUSP
15.0000 mL | Freq: Once | ORAL | Status: AC
  Administered 2019-08-25: 17:00:00 15 mL
  Filled 2019-08-25: qty 30

## 2019-08-25 MED ORDER — ENOXAPARIN SODIUM 80 MG/0.8ML ~~LOC~~ SOLN
1.0000 mg/kg | Freq: Two times a day (BID) | SUBCUTANEOUS | Status: DC
  Administered 2019-08-25 – 2019-08-27 (×4): 70 mg via SUBCUTANEOUS
  Filled 2019-08-25 (×5): qty 0.7

## 2019-08-25 MED ORDER — HEPARIN SODIUM (PORCINE) 1000 UNIT/ML DIALYSIS
1000.0000 [IU] | INTRAMUSCULAR | Status: DC | PRN
  Filled 2019-08-25: qty 6

## 2019-08-25 MED ORDER — WARFARIN SODIUM 10 MG PO TABS
10.0000 mg | ORAL_TABLET | Freq: Once | ORAL | Status: AC
  Administered 2019-08-25: 18:00:00 10 mg
  Filled 2019-08-25: qty 1

## 2019-08-25 MED ORDER — JEVITY 1.5 CAL/FIBER PO LIQD
1000.0000 mL | ORAL | Status: DC
  Administered 2019-08-25 – 2019-08-28 (×4): 1000 mL
  Filled 2019-08-25 (×7): qty 1000

## 2019-08-25 MED ORDER — HEPARIN BOLUS VIA INFUSION
2000.0000 [IU] | Freq: Once | INTRAVENOUS | Status: AC
  Administered 2019-08-25: 2000 [IU] via INTRAVENOUS
  Filled 2019-08-25: qty 2000

## 2019-08-25 MED ORDER — WARFARIN - PHARMACIST DOSING INPATIENT: Freq: Every day | Status: DC

## 2019-08-25 MED ORDER — PANTOPRAZOLE SODIUM 40 MG PO PACK
40.0000 mg | PACK | Freq: Every day | ORAL | Status: DC
  Administered 2019-08-25 – 2019-09-03 (×10): 40 mg
  Filled 2019-08-25 (×12): qty 20

## 2019-08-25 MED ORDER — SODIUM PHOSPHATES 45 MMOLE/15ML IV SOLN
20.0000 mmol | Freq: Once | INTRAVENOUS | Status: AC
  Administered 2019-08-25: 08:00:00 20 mmol via INTRAVENOUS
  Filled 2019-08-25: qty 6.67

## 2019-08-25 MED ORDER — INSULIN ASPART 100 UNIT/ML ~~LOC~~ SOLN
2.0000 [IU] | Freq: Four times a day (QID) | SUBCUTANEOUS | Status: DC
  Administered 2019-08-25 – 2019-08-28 (×8): 2 [IU] via SUBCUTANEOUS
  Administered 2019-08-28: 4 [IU] via SUBCUTANEOUS
  Administered 2019-08-29 – 2019-08-30 (×2): 2 [IU] via SUBCUTANEOUS

## 2019-08-25 MED ORDER — WHITE PETROLATUM EX OINT
TOPICAL_OINTMENT | CUTANEOUS | Status: AC
  Administered 2019-08-25: 0.2
  Filled 2019-08-25: qty 28.35

## 2019-08-25 MED ORDER — PRO-STAT SUGAR FREE PO LIQD
30.0000 mL | Freq: Two times a day (BID) | ORAL | Status: DC
  Administered 2019-08-25 – 2019-09-04 (×20): 30 mL
  Filled 2019-08-25 (×19): qty 30

## 2019-08-25 NOTE — Progress Notes (Addendum)
Advanced Heart Failure Rounding Note   Subjective:    Extubated. Off CVVHD. On Norepi 4 mcg.   Feels like he needs to cough.    Objective:   Weight Range:  Vital Signs:   Temp:  [97.6 F (36.4 C)-98.5 F (36.9 C)] 98 F (36.7 C) (12/24 1117) Pulse Rate:  [63-93] 71 (12/24 1100) Resp:  [13-21] 21 (12/24 1100) BP: (65-118)/(33-77) 97/62 (12/24 0700) SpO2:  [99 %-100 %] 100 % (12/24 1100) Arterial Line BP: (72-125)/(30-52) 97/36 (12/24 1100) Weight:  [72.4 kg] 72.4 kg (12/24 0500) Last BM Date: (PTA)  Weight change: Filed Weights   08/23/19 0237 08/24/19 0310 08/25/19 0500  Weight: 74.9 kg 73.7 kg 72.4 kg    Intake/Output:   Intake/Output Summary (Last 24 hours) at 08/25/2019 1243 Last data filed at 08/25/2019 0800 Gross per 24 hour  Intake 1909.78 ml  Output 4509 ml  Net -2599.22 ml     Physical Exam: General:  Appears chronically ill.  No resp difficulty HEENT: normal Neck: supple. no JVD. Carotids 2+ bilat; no bruits. No lymphadenopathy or thryomegaly appreciated. LIJ HD cat Cor: PMI nondisplaced. Irregular rate & rhythm. No rubs, gallops. 2/6  or murmurs. Lungs: clear on 4 liters [place] Abdomen: soft, nontender, nondistended. No hepatosplenomegaly. No bruits or masses. Good bowel sounds. Extremities: no cyanosis, clubbing, rash, edema Neuro: alert & orientedx3, cranial nerves grossly intact. moves all 4 extremities w/o difficulty. Affect pleasant   Telemetry: A fib 70-80s with PVC    Labs: Basic Metabolic Panel: Recent Labs  Lab 08/21/19 0436 08/22/19 0538 08/23/19 0213 08/23/19 1615 08/24/19 0301 08/24/19 1640 08/25/19 0538  NA 135 136 138 137 134*  135 135 137  136  K 3.9 4.0 4.0 3.7 3.9  3.9 3.9 3.9  3.9  CL 103 103 104 104 102  103 102 102  101  CO2 25 26 24 24 24  25 23 26  26   GLUCOSE 104* 107* 107* 131* 124*  122* 169* 124*  122*  BUN 19 14 14 16  22*  21* 22* 23*  24*  CREATININE 0.73 0.85 1.00 1.17 1.04  0.93 1.07 0.97   0.98  CALCIUM 7.5* 8.3* 8.2* 7.8* 8.2*  8.3* 8.4* 8.5*  8.5*  MG 2.3 2.4 2.6*  --  2.3  --  2.4  PHOS 1.6* 1.8* 2.1* 2.9 2.3* 2.8 2.0*  1.9*    Liver Function Tests: Recent Labs  Lab 08/20/2019 0048 08/09/2019 0401 08/23/19 0213 08/23/19 1615 08/24/19 0301 08/24/19 1640 08/25/19 0538  AST 34 25  --   --   --   --   --   ALT 16 15  --   --   --   --   --   ALKPHOS 54 59  --   --   --   --   --   BILITOT 0.5 1.0  --   --   --   --   --   PROT 5.3* 5.4*  --   --   --   --   --   ALBUMIN 2.3* 2.4* 2.3* 2.1* 2.2* 2.2* 2.2*   No results for input(s): LIPASE, AMYLASE in the last 168 hours. No results for input(s): AMMONIA in the last 168 hours.  CBC: Recent Labs  Lab 08/24/2019 1424 08/30/2019 1639 08/21/19 0436 08/22/19 0538 08/23/19 0213 08/24/19 0301 08/25/19 0538  WBC 19.1*   < > 9.4 10.0 11.7* 13.1* 12.2*  NEUTROABS 17.0*  --   --   --   --   --   --  HGB 11.2*  9.6*  11.2*  --  8.3* 8.4* 8.4* 8.5* 8.1*  HCT 33.0*  32.7*  33.0*  --  29.4* 30.0* 29.4* 30.1* 29.0*  MCV 71.1*   < > 74.6* 74.6* 74.2* 74.3* 74.7*  PLT 233   < > 163 161 142* 147* 126*   < > = values in this interval not displayed.    Cardiac Enzymes: No results for input(s): CKTOTAL, CKMB, CKMBINDEX, TROPONINI in the last 168 hours.  BNP: BNP (last 3 results) Recent Labs    07/19/19 0956 08/02/19 0957 08/10/2019 1424  BNP 298.7* 167.3* 106.1*    ProBNP (last 3 results) No results for input(s): PROBNP in the last 8760 hours.    Other results:  Imaging: DG Abd Portable 1V  Result Date: 08/24/2019 CLINICAL DATA:  Encounter for gastric tube placement. EXAM: PORTABLE ABDOMEN - 1 VIEW COMPARISON:  08/20/2019. FINDINGS: Surgical clips right upper quadrant. Surgical clips right groin. NG tube noted, its tip is kinked in the stomach. No gastric or bowel distention. No free air. Degenerative change lumbar spine and both hips. IMPRESSION: NG tube, its tip is kinked in the stomach. No gastric or bowel  distention. Electronically Signed   By: Marcello Moores  Register   On: 08/24/2019 15:27     Medications:     Scheduled Medications: . albuterol  2.5 mg Nebulization TID  . amiodarone  150 mg Intravenous Once  . chlorhexidine  15 mL Mouth Rinse BID  . Chlorhexidine Gluconate Cloth  6 each Topical Q0600  . darbepoetin (ARANESP) injection - NON-DIALYSIS  100 mcg Subcutaneous Q Wed-1800  . enoxaparin (LOVENOX) injection  1 mg/kg Subcutaneous Q12H  . feeding supplement (PRO-STAT SUGAR FREE 64)  30 mL Per Tube BID  . hydroxypropyl methylcellulose / hypromellose  1 drop Both Eyes TID  . insulin aspart  2-6 Units Subcutaneous Q6H  . mouth rinse  15 mL Mouth Rinse q12n4p  . polyethylene glycol  17 g Oral Daily  . warfarin  10 mg Per Tube ONCE-1800  . Warfarin - Pharmacist Dosing Inpatient   Does not apply q1800    Infusions: . sodium chloride Stopped (08/24/19 1827)  . amiodarone 30 mg/hr (08/25/19 0800)  . dextrose 30 mL/hr at 08/25/19 0800  . feeding supplement (JEVITY 1.5 CAL/FIBER) 1,000 mL (08/25/19 1150)  . norepinephrine (LEVOPHED) Adult infusion 2 mcg/min (08/25/19 1044)  . prismasol BGK 4/2.5 1,500 mL/hr at 08/25/19 0654  . sodium phosphate  Dextrose 5% IVPB 20 mmol (08/25/19 0816)    PRN Medications: sodium chloride, bisacodyl, heparin, heparin, sennosides   Assessment/Plan:   1. AKI on CKD stage 3: Creatinine up to 2.7 at admission with marked hyperkalemia and hyponatremia in setting of use of torsemide, metolazone, and spironolactone at home.  He had urgent HD with improvement.  - CVVHD stopped today. Assessing for renal recovery. CO-OX 76%.  - On NE 4 mcg.  - Renal following 2. Acute on chronic systolic CHF/cardiogenic shock: EF 30-35% on last echo.  [employer] CRT-D, LV not on due to diaphragmatic stimulation.  Hypotension in setting of AKI/hyperkalemia. NE remains at 3   Marked volume overload improving with CVVHD and improving u/o - Co-ox 76% on NE mcg. Hopefully can  wean off.  - CVVH stopped to assess for renal recovery.   Baseline weight about 80kg (currently 72.4 kg ) 3. VT: Patient has [employer] CRT-D device, LV lead off due to diaphragmatic pacing.  Patient has has runs of VT, now on amiodarone  gtt.  - VT quiescent - Keep K > 4.0 and Mg > 2.0 (K 4.0 and MG 2.6 today) 4. Digoxin toxicity: Severely elevated digoxin level, has had digibind and HD.  - bradycardia improved 5. CAD: Last cath in 2016 with patent native coronaries, probably occluded SVG-RCA.  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  - Continue heparin. Hgb 8.1  7. Atrial fibrillation: Chronic.   - rate controlled 80-90s - heparin as above  - continue IV amio for now 8. Acute hypoxic respiratory failure -Extubated on 2 liters [place]    Length of Stay: [place] NP-C  08/25/2019, 12:43 PM  Advanced Heart Failure Team Pager [phone] (M-F; 7a - 4p)  Please contact [facility] for night-coverage after hours (4p -7a ) and weekends on [email or web address]  Agree with above.   He is extubated. Very weak. On NE at 3. Co-ox 76%  Now off CVVHD. CVP 6-7 range. Minimal urine output. Chronic AF rate controlled on IV amio.   General:  Weak and ill appearing. No resp difficulty HEENT: normal Neck: supple. no JVD.  RIJ wound ok. LIJ trialysis cath Carotids 2+ bilat; no bruits. No lymphadenopathy or thryomegaly appreciated. Cor: PMI nondisplaced. Irregular rate & rhythm.Mech s1/s2 2/6 SEM LSB Lungs: clear Abdomen: soft, nontender, nondistended. No hepatosplenomegaly. No bruits or masses. Good bowel sounds. Extremities: no cyanosis, clubbing, rash, 1+ edema Neuro: alert follows commands  He is extubated and off CVVHD. Urine output minimal. Co-ox ok on low-dose NE. Will wean NE as BP tolerates. Remains on IV amio for rate control. Likely can stop amio soon. Continue heparin. Prognosis guarded.   CRITICAL CARE Performed by: Glori Bickers  Total critical care  time: 35 minutes  Critical care time was exclusive of separately billable procedures and treating other patients.  Critical care was necessary to treat or prevent imminent or life-threatening deterioration.  Critical care was time spent personally by me (independent of midlevel providers or residents) on the following activities: development of treatment plan with patient and/or surrogate as well as nursing, discussions with consultants, evaluation of patient's response to treatment, examination of patient, obtaining history from patient or surrogate, ordering and performing treatments and interventions, ordering and review of laboratory studies, ordering and review of radiographic studies, pulse oximetry and re-evaluation of patient's condition.  Glori Bickers, MD  5:00 PM

## 2019-08-25 NOTE — Progress Notes (Addendum)
Pt still complaining about abdominal pain after receiving milk of magnesium and Protonix. Pt belly slightly firm, hypoactive bowel sounds, nontender to palpation, and no associated nausea/vomiting. Stopped tube feeds and hooked NG tube to low-intermittent wall suction per Elink. Report given to [facility].

## 2019-08-25 NOTE — Evaluation (Signed)
Physical Therapy Evaluation Patient Details Name: Lance Cook MRN: [id number] DOB: 1961/03/09 Today's Date: 08/25/2019   History of Present Illness  58 y.o. yo male with h/o CHF (EF 30%), mechanical mitral and aortic valve A. fib, s/p VT arrest, ICD, DM, Bell's palsy, CVA came to the ER 08/06/2019 with fatigue, weakness and hypotension; potassium of 8.4 associated with hypotension and AKI. IJ catheter placed for HD (went into rt carotid artery and required surgery for artery repair); 12/18 remained intubated after surgery, began CRRT; extubated 08/24/19  Clinical Impression   Pt admitted with above diagnosis. Patient very ill appearing with multiple lines and wife at his bedside. Wife able to provide information re: home set-up and pt's prior functional status. Patient globally weak (including his voice) and requires assist with all mobility. His BP was too low to attempt EOB (MAP 52). Wife wanting to discuss how COVID is not really a thing and deferred discussion re: discharge venue until patient can further participate and assess his needs.  Pt currently with functional limitations due to the deficits listed below (see PT Problem List). Pt will benefit from skilled PT to increase their independence and safety with mobility to allow discharge to the venue listed below.       Follow Up Recommendations SNF;Supervision/Assistance - 24 hour(will continue to assess as pt able to tolerate incr mobility)    Equipment Recommendations  None recommended by PT    Recommendations for Other Services       Precautions / Restrictions Precautions Precautions: Fall;Other (comment) Precaution Comments: multiple lines, CRRT via rt IJ catheter, IV, O2      Mobility  Bed Mobility               General bed mobility comments: not tested; as beginning to assess, wife/pt noted he was on bedpan and pt reported he was not ready to get off the bedpan  Transfers                     Ambulation/Gait                Stairs            Wheelchair Mobility    Modified Rankin (Stroke Patients Only)       Balance                                             Pertinent Vitals/Pain Pain Assessment: Faces Faces Pain Scale: No hurt Pain Intervention(s): Monitored during session;Repositioned    Home Living Family/patient expects to be discharged to:: Private residence Living Arrangements: Spouse/significant other Available Help at Discharge: Family;Available 24 hours/day(wife is visually impaired) Type of Home: Mobile home Home Access: Stairs to enter Entrance Stairs-Rails: Right Entrance Stairs-Number of Steps: 2 Home Layout: One level Home Equipment: Walker - 2 wheels Additional Comments: Wife present and provided information (denied equipment previously listed in Aug 2020)    Prior Function Level of Independence: Needs assistance   Gait / Transfers Assistance Needed: Uses RW or furniture walks.  ADL's / Homemaking Assistance Needed: wife assists with LB bathing and dressing, dries pt after showering and does much of the IADL        Hand Dominance   Dominant Hand: Right    Extremity/Trunk Assessment   Upper Extremity Assessment Upper Extremity Assessment: Defer to OT evaluation    Lower  Extremity Assessment Lower Extremity Assessment: RLE deficits/detail;LLE deficits/detail RLE Deficits / Details: ankle DF to neutral, otherwise AAROM WFL; hip flexion 2+, knee extension 2+ LLE Deficits / Details: ankle DF to -10 (from neutral), otherwise PROM WFL; no active dorsiflexion with trace toe extension noted; hip flexion 2+, knee extension 2+       Communication   Communication: Expressive difficulties(voice very weak to absent)  Cognition Arousal/Alertness: Awake/alert Behavior During Therapy: Flat affect Overall Cognitive Status: Difficult to assess                                        General  Comments General comments (skin integrity, edema, etc.): wife present throughout session. Neither RN or wife noted pt was on bedpan as PT iniitated evaluation--was not discovered until wanted to initiate bed mobility. Pt refused (and noted low BP with MAP 52)    Exercises Other Exercises Other Exercises: AAROM x 4 extremities with 3-5 reps of each movement (except limited Rt shoulder movement due to rt IJ catheter) and rt elbow due to IV at cubital fossa bleeding--RN in to assess)   Assessment/Plan    PT Assessment Patient needs continued PT services  PT Problem List Decreased strength;Decreased range of motion;Decreased activity tolerance;Decreased balance;Decreased mobility;Decreased knowledge of use of DME;Cardiopulmonary status limiting activity       PT Treatment Interventions DME instruction;Functional mobility training;Therapeutic activities;Therapeutic exercise;Balance training;Neuromuscular re-education;Cognitive remediation;Patient/family education    PT Goals (Current goals can be found in the Care Plan section)  Acute Rehab PT Goals Patient Stated Goal: none stated; wife wants pt to come home PT Goal Formulation: With patient/family Time For Goal Achievement: 09/08/19 Potential to Achieve Goals: Fair    Frequency Min 2X/week   Barriers to discharge Decreased caregiver support ?wife's ability to provide level of care required    Co-evaluation               AM-PAC PT "6 Clicks" Mobility  Outcome Measure Help needed turning from your back to your side while in a flat bed without using bedrails?: Total Help needed moving from lying on your back to sitting on the side of a flat bed without using bedrails?: Total Help needed moving to and from a bed to a chair (including a wheelchair)?: Total Help needed standing up from a chair using your arms (e.g., wheelchair or bedside chair)?: Total Help needed to walk in hospital room?: Total Help needed climbing 3-5 steps with  a railing? : Total 6 Click Score: 6    End of Session Equipment Utilized During Treatment: Oxygen Activity Tolerance: Patient limited by fatigue;Treatment limited secondary to medical complications (Comment)(low BP) Patient left: in bed;with call bell/phone within reach;with family/visitor present Nurse Communication: Other (comment)(RUE IV site bleeding; lower BP and dangle EOb deferred) PT Visit Diagnosis: Muscle weakness (generalized) (M62.81);Difficulty in walking, not elsewhere classified (R26.2)    Time: [id number] PT Time Calculation (min) (ACUTE ONLY): 37 min   Charges:   PT Evaluation $PT Eval High Complexity: 1 High PT Treatments $Therapeutic Exercise: 8-22 mins         Arby Barrette, PT Pager [phone]   Rexanne Mano 08/25/2019, 2:34 PM

## 2019-08-25 NOTE — Progress Notes (Signed)
ANTICOAGULATION CONSULT NOTE - Follow Up Consult  Pharmacy Consult for heparin Indication: AVR/MVR and Afib  Labs: Recent Labs    08/22/19 0538 08/23/19 0213 08/23/19 1615 08/24/19 0301 08/24/19 1000 08/24/19 1640 08/24/19 1900 08/25/19 0203  HGB 8.4* 8.4*  --  8.5*  --   --   --   --   HCT 30.0* 29.4*  --  30.1*  --   --   --   --   PLT 161 142*  --  147*  --   --   --   --   HEPARINUNFRC 0.39 0.33  --  0.20* <0.10*  --  0.22* 0.22*  CREATININE 0.85 1.00 1.17 1.04  0.93  --  1.07  --   --     Assessment: 58yo male subtherapeutic on heparin with no change in heparin level despite rate change; no gtt issues or signs of bleeding per RN.  Goal of Therapy:  Heparin level 0.3-0.7 units/ml   Plan:  Will rebolus with heparin 2000 units and increase heparin gtt by 3 units/kg/hr to 2500 units/hr and check level in 6 hours.    Wynona Neat, PharmD, BCPS  08/25/2019,3:15 AM

## 2019-08-25 NOTE — Progress Notes (Signed)
VAST consulted to place 2nd PIV access. Pt with Trialysis in place with extra port being used for infusion. Pt with PIV in Right anterior distal forearm infusing amiodarone; upon assessment and palpation, noted circle of redness at catheter tip location as well as pt pain. Notified unit RN and spoke with her about needed access.  VAST RN placed 2 PIV's utilizing ultrasound, both in right anterior forearm proximal to current PIV site (see IV flowsheet for size and location).  Unit RN educated to change IV tubing before switching over to new PIV sites. She verbalized understanding.

## 2019-08-25 NOTE — Progress Notes (Signed)
[facility] NEPHROLOGY PROGRESS NOTE  Assessment/ Plan: Pt is a 58 y.o. yo male CHF, mechanical mitral and aortic valve A. fib, s/p VT arrest, DM, potassium of 8.4 associated with hypotension and AKI on admission.  #Severe hyperkalemia with EKG changes due to use of spironolactone, KCl and AKI: Received urgent dialysis on 12/17, started CRRT on 12/18 for fluid overload.   Potassium level now normal on RRT.  #Acute on CKD stage III: Baseline creatinine around 1.3-1.6, likely ischemic ATN due to shock.  On CRRT with total UF of around 3.1 L in last 24 hours.  On IV heparin, 4K bath. CVP is now 6  with dep edema but likely third spacing.   I would like to hold CRRT today and see what he can do  #Acute on chronic hyponatremia: Probably due to hypervolemia/diuretics.  He was on metolazone, torsemide and Aldactone at home.  Sodium level  improved.  #Acute on Chronic CHF, aortic and mitral valve replacement: Per cardiology.  UF with CRRT.  Will attempt to hold CRRT and watch him-  No diuretics yet though  #Hypotension/shock: On Levophed.   Now is off pressors but with soft BP-  Will add midodrine   #Acute respiratory failure/VDRF: Intubated, per PCCM.  Extubated on 2 L  [place]-  K close to 4-  Phos low - will give some repletion today and should drift up off of CRRT   Anemia- check iron stores and give ESA     Subjective: Extubated, off of pressors - minus 3.1 liters with CRRT overnight -  Only 55 of urine last 24 hours -  Looks at me but just grunting-  Not sure how much he understands    Objective Vital signs in last 24 hours: Vitals:   08/25/19 0400 08/25/19 0500 08/25/19 0600 08/25/19 0700  BP: (!) 90/33 90/70 (!) 85/41 97/62  Pulse: 79 63 72 69  Resp: 19 18 17 17   Temp:      TempSrc:      SpO2: 100% 100% 100% 99%  Weight:  72.4 kg    Height:       Weight change: -1.3 kg  Intake/Output Summary (Last 24 hours) at 08/25/2019 0705 Last data filed at 08/25/2019  0700 Gross per 24 hour  Intake 2552.48 ml  Output 5666 ml  Net -3113.52 ml       Labs: Basic Metabolic Panel: Recent Labs  Lab 08/24/19 0301 08/24/19 1640 08/25/19 0538  NA 134*  135 135 137  136  K 3.9  3.9 3.9 3.9  3.9  CL 102  103 102 102  101  CO2 24  25 23 26  26   GLUCOSE 124*  122* 169* 124*  122*  BUN 22*  21* 22* 23*  24*  CREATININE 1.04  0.93 1.07 0.97  0.98  CALCIUM 8.2*  8.3* 8.4* 8.5*  8.5*  PHOS 2.3* 2.8 2.0*  1.9*   Liver Function Tests: Recent Labs  Lab 08/17/2019 0048 08/13/2019 0401 08/24/19 0301 08/24/19 1640 08/25/19 0538  AST 34 25  --   --   --   ALT 16 15  --   --   --   ALKPHOS 54 59  --   --   --   BILITOT 0.5 1.0  --   --   --   PROT 5.3* 5.4*  --   --   --   ALBUMIN 2.3* 2.4* 2.2* 2.2* 2.2*   No results for input(s): LIPASE, AMYLASE  in the last 168 hours. No results for input(s): AMMONIA in the last 168 hours. CBC: Recent Labs  Lab 08/14/2019 1424 08/14/2019 1639 08/21/19 0436 08/22/19 0538 08/23/19 0213 08/24/19 0301 08/25/19 0538  WBC 19.1*   < > 9.4 10.0 11.7* 13.1* 12.2*  NEUTROABS 17.0*  --   --   --   --   --   --   HGB 11.2*  9.6*  11.2*  --  8.3* 8.4* 8.4* 8.5* 8.1*  HCT 33.0*  32.7*  33.0*  --  29.4* 30.0* 29.4* 30.1* 29.0*  MCV 71.1*   < > 74.6* 74.6* 74.2* 74.3* 74.7*  PLT 233   < > 163 161 142* 147* 126*   < > = values in this interval not displayed.   Cardiac Enzymes: No results for input(s): CKTOTAL, CKMB, CKMBINDEX, TROPONINI in the last 168 hours. CBG: Recent Labs  Lab 08/24/19 1548 08/24/19 1949 08/24/19 2036 08/24/19 2323 08/25/19 0324  GLUCAP 123* 60* 125* 68* 70    Iron Studies: No results for input(s): IRON, TIBC, TRANSFERRIN, FERRITIN in the last 72 hours. Studies/Results: DG Abd Portable 1V  Result Date: 08/24/2019 CLINICAL DATA:  Encounter for gastric tube placement. EXAM: PORTABLE ABDOMEN - 1 VIEW COMPARISON:  08/20/2019. FINDINGS: Surgical clips right upper quadrant.  Surgical clips right groin. NG tube noted, its tip is kinked in the stomach. No gastric or bowel distention. No free air. Degenerative change lumbar spine and both hips. IMPRESSION: NG tube, its tip is kinked in the stomach. No gastric or bowel distention. Electronically Signed   By: Marcello Moores  Register   On: 08/24/2019 15:27    Medications: Infusions: .  prismasol BGK 4/2.5 500 mL/hr at 08/24/19 2213  .  prismasol BGK 4/2.5 300 mL/hr at 08/24/19 1725  . sodium chloride Stopped (08/24/19 1827)  . amiodarone 30 mg/hr (08/25/19 0700)  . dextrose 30 mL/hr at 08/25/19 0700  . heparin 2,500 Units/hr (08/25/19 0700)  . norepinephrine (LEVOPHED) Adult infusion Stopped (08/24/19 1900)  . prismasol BGK 4/2.5 1,500 mL/hr at 08/25/19 [id number]    Scheduled Medications: . albuterol  2.5 mg Nebulization TID  . amiodarone  150 mg Intravenous Once  . chlorhexidine  15 mL Mouth Rinse BID  . Chlorhexidine Gluconate Cloth  6 each Topical Q0600  . darbepoetin (ARANESP) injection - NON-DIALYSIS  100 mcg Subcutaneous Q Wed-1800  . feeding supplement (NEPRO CARB STEADY)  1,000 mL Oral Q24H  . feeding supplement (PRO-STAT SUGAR FREE 64)  30 mL Per Tube TID  . hydroxypropyl methylcellulose / hypromellose  1 drop Both Eyes TID  . insulin aspart  2-6 Units Subcutaneous Q4H  . mouth rinse  15 mL Mouth Rinse q12n4p  . methocarbamol  500 mg Per Tube TID  . pantoprazole sodium  40 mg Per Tube Daily  . polyethylene glycol  17 g Oral Daily    have reviewed scheduled and prn medications.  Physical Exam: General: sitting up with glasses on-  Extubated, grunting- no commands  Heart:RRR, s1s2 nl Lungs: Coarse breath sound bilateral Abdomen:soft,  non-distended Extremities: LE edema + pitting to dep areas Dialysis Access: Left IJ temporary catheter. In place for 6 days   Orlean Holtrop A Adom Schoeneck 08/25/2019,7:05 AM  LOS: 7 days

## 2019-08-25 NOTE — Progress Notes (Signed)
ANTICOAGULATION CONSULT NOTE - Follow Up Consult  Pharmacy Consult for Heparin>>lovenox + warfarin Indication: Hx St Jude MVR/AVR + Afib  Patient Measurements: Height: 5' 2.21" (158 cm) Weight: 159 lb 9.8 oz (72.4 kg) IBW/kg (Calculated) : 55.07 Heparin Dosing Weight: 70 kg  Vital Signs: Temp: 98 F (36.7 C) (12/24 1117) Temp Source: Axillary (12/24 1117) BP: 97/62 (12/24 0700) Pulse Rate: 71 (12/24 1100)  Labs: Recent Labs    08/23/19 0213 08/24/19 0301 08/24/19 1640 08/24/19 1900 08/25/19 0203 08/25/19 0538 08/25/19 0930  HGB 8.4* 8.5*  --   --   --  8.1*  --   HCT 29.4* 30.1*  --   --   --  29.0*  --   PLT 142* 147*  --   --   --  126*  --   HEPARINUNFRC 0.33 0.20*  --  0.22* 0.22*  --  0.25*  CREATININE 1.00 1.04  0.93 1.07  --   --  0.97  0.98  --     Estimated Creatinine Clearance: 72.1 mL/min (by C-G formula based on SCr of 0.98 mg/dL).  Assessment: 58 yr old male on warfarin PTA for hx St Jude MVR + Afib - admitted with AKI and hyperkalemia requiring line replacement and start of CRRT. FFP given and heparin bridge started when INR<2.5. Pharmacy consulted to dose heparin.  PTA warfarin dose: 5mg  daily  Heparin level remains subtherapeutic at 0.25 s/p multiple rate increases and boluses.    Goal of Therapy:  INR 2.5-3.5 Monitor platelets by anticoagulation protocol: Yes   Plan:  Will transition to Lovenox bridge and restart warfarin Lovenox 1mg /kg SQ (70mg ) every 12 hours Monitor renal function off CRRT and will adjust AC plan accordingly  Warfarin 10mg  PO x 1 tonight Daily INR, s/s bleeding, CBC  Bertis Ruddy, PharmD Clinical Pharmacist Please check AMION for all [place] numbers 08/25/2019 11:44 AM

## 2019-08-25 NOTE — Progress Notes (Signed)
Nutrition Follow-up  DOCUMENTATION CODES:   Severe malnutrition in context of chronic illness  INTERVENTION:   Change TF regimen to better meet nutrition needs:   Jevity 1.5 at 55 ml/h (1320 ml per day)   Pro-stat 30 ml BID   Provides 2180 kcal, 114 gm protein, 1003 ml free water daily  NUTRITION DIAGNOSIS:   Severe Malnutrition related to chronic illness(CHF) as evidenced by severe fat depletion, severe muscle depletion.  Ongoing   GOAL:   Patient will meet greater than or equal to 90% of their needs  Met with TF  MONITOR:   Vent status, TF tolerance, Skin, Labs  ASSESSMENT:   58 year old male with history of Bentall procedure, MVR, Afib, T2DM, CAD with chronic systolic and diastolic CHF, Bells palsy, h/o CVA, OSA, GERD, HTN, Gout, PVD s/p mitral valve replacement 2016, testicular cancer, and tubulovillous adenoma of colon who presented to ED with fatigue, weakness, and hypotension. Patient found to be profoundly bradycardic with severe left bundle branch block; severely hyperkalemic with new onset acute renal failure. Patient admitted to ICU for emergent hemodialysis.  Extubated 12/23. NG tube placed 12/23. Receiving Nepro at 40 ml/h with Pro-stat 30 ml TID via NGT. Tolerating well. CRRT to be held today. Low phosphorus is being replaced.    Labs reviewed.  Potassium 3.9 WNL, phosphorus 2 (L) CBG's: 70-56-131  Medications reviewed and includes sodium phosphate.  Weight trending down with diuresis. I/O - 14 L since admission.  Diet Order:   Diet Order            Diet NPO time specified  Diet effective now              EDUCATION NEEDS:   No education needs have been identified at this time  Skin:  Skin Assessment: Reviewed RN Assessment(incision;closed; right neck)  Last BM:  No BM documented  Height:   Ht Readings from Last 1 Encounters:  08/23/19 5' 2.21" (1.58 m)    Weight:   Wt Readings from Last 1 Encounters:  08/25/19 72.4 kg     Ideal Body Weight:  53.6 kg  BMI:  Body mass index is 29 kg/m.  Estimated Nutritional Needs:   Kcal:  2000-2200  Protein:  100-115 gm  Fluid:  >/= 2 L    Molli Barrows, RD, LDN, [place] Pager [phone] After Hours Pager [phone]

## 2019-08-25 NOTE — Evaluation (Addendum)
Clinical/Bedside Swallow Evaluation Patient Details  Name: Lance Cook MRN: [id number] Date of Birth: 01/15/1961  Today's Date: 08/25/2019 Time: SLP Start Time (ACUTE ONLY): 21 SLP Stop Time (ACUTE ONLY): 1118 SLP Time Calculation (min) (ACUTE ONLY): 13 min  Past Medical History:  Past Medical History:  Diagnosis Date  . Anemia   . Anxiety   . ARDS (adult respiratory distress syndrome) ([place]) 01/27/2015  . Arthritis   . Ascending aortic dissection ([place]) 07/14/2008   Localized dissection of ascending aorta noted on CTA in 2009 and stable on CTA in 2011  . Atrial fibrillation (H[facility])    chronic persistent  . Bell's palsy   . C. difficile diarrhea   . CAD (coronary artery disease)    Old scar inferior wall myoview, 10/2009 EF 52%.  He did have previous SVG to RCA but no obstructive disease noted on his most recent cath.  SVG occluded.   . Cellulitis 04/02/2014  . Cerebral artery occlusion with cerebral infarction ([place]) 10/08/2011   Overview:  Overview:  And hx of TIA prior to CABG, all thought due to systemic emboli prior to coumadin   . Chronic diastolic heart failure ([place]) 03/01/2015   a. 12/2015: echo showing a preserved EF of 65-70%, moderate AS, and moderate TR.   Marland Kitchen CVA (cerebral vascular accident) ([place]) 10/08/2011   And hx of TIA prior to CABG, all thought due to systemic emboli prior to coumadin   . DIABETES MELLITUS, TYPE II 11/01/2009   Qualifier: Diagnosis of  By: [name] MD, [name]    . Diverticulosis   . 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 Gastric polyps   . GERD (gastroesophageal reflux disease)    not needing medication at thhis time- 01/22/15  . Gout   . Hiatal hernia   . History of colon polyps 12/23/2011   Overview:  Overview:  Colonoscopy January 2010, 6 mm rectal tubulovillous adenoma. No high-grade dysplasia   . Hyperlipidemia    . Hypertension   . Hypogonadism male 04/08/2012  . Impaired glucose tolerance 10/08/2011  . Morbid obesity ([place]) 04/02/2014  . OSA (obstructive sleep apnea)    CPAP  . Peripheral vascular disease ([place])   . 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 [id number]   33 mm St Jude bileaflet mechanical valve placed via right mini thoracotomy approach  . 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 Testicular cancer [facility])    He was 58 y/o. He had surgical resection and rad tx's.   Marland Kitchen TIA (transient ischemic attack)    age 17  . Tubulovillous adenoma of colon   . 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 Venous (peripheral) insufficiency 02/03/2005   Past Surgical History:  Past Surgical History:  Procedure Laterality Date  . aortic truck    . BENTALL PROCEDURE  1988   25 mm St Jude mechanical valve conduit - Dr Blase Mess at [facility] in [place], [place]  . BIOPSY  06/01/2018   Procedure: BIOPSY;  Surgeon: Jerene Bears, MD;  Location: [facility];  Service: Gastroenterology;;  . BIV ICD INSERTION CRT-D N/A 06/27/2019   Procedure: BIV ICD INSERTION CRT-D;  Surgeon: Evans Lance, MD;  Location: [place] CV LAB;  Service: Cardiovascular;  Laterality: N/A;  . BUBBLE STUDY  04/04/2019   Procedure: BUBBLE STUDY;  Surgeon: Larey Dresser, MD;  Location: [facility] ENDOSCOPY;  Service: Cardiovascular;;  . CARDIAC VALVE REPLACEMENT     [phone]  . CHOLECYSTECTOMY  2011  . COLONOSCOPY WITH PROPOFOL N/A 06/17/2016   Procedure: COLONOSCOPY WITH PROPOFOL;  Surgeon: Jerene Bears, MD;  Location: [facility] ENDOSCOPY;  Service: Gastroenterology;  Laterality: N/A;  . COLONOSCOPY WITH PROPOFOL N/A 06/01/2018   Procedure: COLONOSCOPY WITH PROPOFOL;  Surgeon: Jerene Bears, MD;  Location: [facility] ENDOSCOPY;  Service: Gastroenterology;  Laterality: N/A;  . CORONARY ARTERY  BYPASS GRAFT    . LASER ABLATION Left 03/06/2010   leg  . MITRAL VALVE REPLACEMENT Right 01/24/2015   Procedure: Re-Operation, MINIMALLY INVASIVE MITRAL VALVE (MV) REPLACEMENT.;  Surgeon: Rexene Alberts, MD;  Location: [facility];  Service: Open Heart Surgery;  Laterality: Right;  . ORCHIECTOMY  age 66   testicular cancer  . OTOPLASTY     bilateral, age 68  . POLYPECTOMY  06/01/2018   Procedure: POLYPECTOMY;  Surgeon: Jerene Bears, MD;  Location: [facility];  Service: Gastroenterology;;  . REMOVAL OF A DIALYSIS CATHETER Right 08/13/2019   Procedure: REMOVAL OF DIALYSIS CATHTER FROM RIGHT CAROTID BIFURCATION, REPAIR OF RIGHT CAROTID ARTERY WITH BOVINE PATCH;  Surgeon: Angelia Mould, MD;  Location: [facility];  Service: Vascular;  Laterality: Right;  . RIGHT HEART CATH N/A 04/07/2019   Procedure: RIGHT HEART CATH;  Surgeon: Larey Dresser, MD;  Location: [place] CV LAB;  Service: Cardiovascular;  Laterality: N/A;  . TEE WITHOUT CARDIOVERSION N/A 10/11/2014   Procedure: TRANSESOPHAGEAL ECHOCARDIOGRAM (TEE);  Surgeon: Pixie Casino, MD;  Location: [facility] ENDOSCOPY;  Service: Cardiovascular;  Laterality: N/A;  . TEE WITHOUT CARDIOVERSION N/A 01/24/2015   Procedure: TRANSESOPHAGEAL ECHOCARDIOGRAM (TEE);  Surgeon: Rexene Alberts, MD;  Location: [place];  Service: Open Heart Surgery;  Laterality: N/A;  . TEE WITHOUT CARDIOVERSION N/A 04/04/2019   Procedure: TRANSESOPHAGEAL ECHOCARDIOGRAM (TEE);  Surgeon: Larey Dresser, MD;  Location: [facility] ENDOSCOPY;  Service: Cardiovascular;  Laterality: N/A;  . TONSILLECTOMY  1967  . TOOTH EXTRACTION  20137   HPI:  58 year old man, history of TIA, GERD, hiatal hernia, HTN, DM, Bell's palsy, MVR, atrial fibrillation, CAD with chronic systolic and diastolic CHF admitted with fatigue, weakness and hypotension and found to be profoundly bradycardic with severe left bundle branch blockand severely hyperkalemic with new acute renal failure. Intubated 12/18-12/23. On 12/18  had HD catheter removed from his carotid.    Assessment / Plan / Recommendation Clinical Impression  Pt is cachectic and significantly deconditioned 58 year old with multiple medical issues presently. On arrival he is reaching for [facility] as did frequently throughout eval in attempts to suction oral cavity/pharynx. Pt affirmed globus sensation of secretions in pharynx however no significant pharyngeal or chest congestion obvious this eval. Cough on command is weak and mostly nonproductive. He was able to suction mild amount following ice chip and tsp water trials. Puree not given due to risk of increased pharyngeal residue and inability to clear in pt with weak appearing swallow and cough. He has a large healing scab on right carotid neck area after removal of HD cath- may have possible trauma leading to edema and weakness or discoordination resulting in possible pharyngeal residue. Currently larger bore NGT placed- recommend continue NPO and replace with Cortrack when able. Continue to provide oral care and encourage him for swallow attemtps versus freaquent self  suctioning. ST will continue to intervene targeting increased airway protection, ability to expel mucous etc via cough and manage secretions.        SLP Visit Diagnosis: Dysphagia, unspecified (R13.10)    Aspiration Risk  Moderate aspiration risk    Diet Recommendation NPO   Medication Administration: Via alternative means    Other  Recommendations Oral Care Recommendations: Oral care QID   Follow up Recommendations Skilled Nursing facility      Frequency and Duration min 2x/week  2 weeks       Prognosis Prognosis for Safe Diet Advancement: (fair-good) Barriers to Reach Goals: Severity of deficits;Other (Comment)(deconditioning)      Swallow Study   General HPI: 58 year old man, history of TIA, GERD, hiatal hernia, HTN, DM, Bell's palsy, MVR, atrial fibrillation, CAD with chronic systolic and diastolic CHF admitted with  fatigue, weakness and hypotension and found to be profoundly bradycardic with severe left bundle branch blockand severely hyperkalemic with new acute renal failure. Intubated 12/18-12/23. On 12/18 had HD catheter removed from his carotid.  Type of Study: Bedside Swallow Evaluation Previous Swallow Assessment: (none) Diet Prior to this Study: NPO;NG Tube Temperature Spikes Noted: No Respiratory Status: Nasal cannula History of Recent Intubation: Yes Length of Intubations (days): 6 days Date extubated: 08/24/19 Behavior/Cognition: Alert;Cooperative;Requires cueing Oral Cavity Assessment: Dry Oral Care Completed by SLP: Yes Oral Cavity - Dentition: Adequate natural dentition Vision: Functional for self-feeding Self-Feeding Abilities: Needs assist Patient Positioning: Upright in bed Baseline Vocal Quality: Normal Volitional Cough: Weak    Oral/Motor/Sensory Function Overall Oral Motor/Sensory Function: Within functional limits   Ice Chips Ice chips: Impaired Presentation: Spoon Oral Phase Impairments: (none) Oral Phase Functional Implications: (none) Pharyngeal Phase Impairments: Multiple swallows;Throat Clearing - Immediate;Throat Clearing - Delayed;Other (comments)(effortful appearing)   Thin Liquid Thin Liquid: Impaired Presentation: Spoon Pharyngeal  Phase Impairments: Cough - Immediate;Throat Clearing - Delayed;Multiple swallows    Nectar Thick Nectar Thick Liquid: Not tested   Honey Thick Honey Thick Liquid: Not tested   Puree Puree: Not tested   Solid     Solid: Not tested      Houston Siren 08/25/2019,12:07 PM  .lumbosacral

## 2019-08-25 NOTE — Progress Notes (Addendum)
NAME:  Lance Cook, MRN:  [id number], DOB:  1960/12/23, LOS: 7 ADMISSION DATE:  08/26/2019, CONSULTATION DATE:  08/27/2019 REFERRING MD:  Dr Sherry Ruffing, CHIEF COMPLAINT:  Hyperkalemia    Brief History   58 yo male found to have severe life-threatening hyperkalemia, PCCM consulted for admission and need for urgent iHD.  History of present illness   58 year old man, history of Bentall procedure, MVR, atrial fibrillation, CAD with chronic systolic and diastolic CHF.  Came to the ER with fatigue, weakness and hypotension.  He was found to be profoundly bradycardic with severe left bundle branch block and QRS widening on EKG.  Found to be severely hyperkalemic with new acute renal failure.  He is on digoxin at baseline and his digoxin level was 2.6.  He will be admitted emergently to the ICU for hemodialysis. He was given digibind for dig toxicity.   Significant Hospital Events   12/17 Admitted, urgent HD 12/18 had HD catheter in carotid removed in OR. Returned intubated  Consults:  Nephrology Heart Failure  Procedures:  R IJ HD catheter 12/17 >> Misplaced in carotid, removed in OR 12/18  L IJ HD catheter 12/17 >>  ETI 12/18>>12/23  Significant Diagnostic Tests:  NM cardiac amyloid scan 10/21 >> equivocal for any evidence amyloidosis  ECHO 12/18 >  1. Left ventricular ejection fraction, by visual estimation, is 30 to 35%. The left ventricle has severely decreased function. Severely increased left ventricular size. There is no left ventricular hypertrophy.  2. Definity contrast agent was given IV to delineate the left ventricular endocardial borders.  3. Global hypokinesis, though function more preserved at base than apex. LV contrast excludes apical thrombus.  4. Global right ventricle has normal systolic function.The right ventricular size is normal. No increase in right ventricular wall thickness.  5. Left atrial size was normal.  6. Right atrial size was normal.  7. The mitral valve has  been repaired/replaced. Mild mitral valve regurgitation. Moderate mitral stenosis.  8. The tricuspid valve is normal in structure. Tricuspid valve regurgitation mild-moderate.  9. 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 10. The aortic valve is normal in structure. Aortic valve regurgitation is trivial by color flow Doppler. Structurally normal aortic valve, with no evidence of sclerosis or stenosis. 11. Aortic valve with trivial perivalvular leak. 12. The pulmonic valve was grossly normal. Pulmonic valve regurgitation is not visualized by color flow Doppler. 13. S/P Bentall. 14. Moderately elevated pulmonary artery systolic pressure. 15. A pacer wire is visualized in the RV and RA. 16. The inferior vena cava is dilated in size with <50% respiratory variability, suggesting right atrial pressure of 15 mmHg. 17. History of mechanical aortic and mitral valves. Leaflets not well visualized, could consider fluroscopy of leaflets, but gradients not suggestive of obstruction.  CXR 12/17 > New left internal jugular central line tip in the SVC above the right atrium. No pneumothorax. No other change.  Micro Data:  COVID 12/17 > negative  Blood cultures 12/17 >NGTD   MRSA PCR 12/17 >negative  Urine culture 12/18 >  Negative  Antimicrobials:  Cefazolin 12/18 SCIP   Interim history/subjective:  Extubated 12/23  Objective   Blood pressure 97/62, pulse 69, temperature 98.5 F (36.9 C), temperature source Axillary, resp. rate 17, height 5' 2.21" (1.58 m), weight 72.4 kg, SpO2 99 %. CVP:  [6 mmHg-9 mmHg] 6 mmHg      Intake/Output Summary (Last 24 hours) at 08/25/2019 0811 Last data filed at 08/25/2019 0800 Gross per 24 hour  Intake  2496.02 ml  Output 5778 ml  Net -3281.98 ml   Filed Weights   08/23/19 0237 08/24/19 0310 08/25/19 0500  Weight: 74.9 kg 73.7 kg 72.4 kg    Examination: General: Acutely and chronically ill-appearing male, appears much older than chronological age,  cachectic.  Apparent distress HENT: Normocephalic, PERRL-sluggish Neck: No JVD. Trachea midline.  Right neck incision site clean dry intact, well approximated CV: RRR. S1S2. No MRG.  Crisp MV click.  +2 radial pulses.  Feet DP/DP pulses Lungs: BBS coarse tracheal rhonchi, diminished at bases FNL, symmetrical.  Patient is attempting to cough and clear his throat, weak cough ABD: Full active BS x4. SNT/ND. No masses, guarding or rigidity GU: Foley EXT: MAE equally.  Edema noted to thighs Skin: PWD. In tact.  Skin no stasis changes lower extremities Neuro: Alert to verbal, does not follow commands, moves spontaneously.  No focal deficits    Resolved Hospital Problem list   Coagulopathy with elevated INR Hyperkalemia  Assessment & Plan:  [name] is a 58 yo gentleman with significant cardiac history including HFrEF, s/p MVR, s/p Bentall procedure who presents with:  Acute hypoxemic respiratory failure -Extubated 12/23 -Difficulty in clearing secretions Plan:  Aggressive bronchial hygiene, NT suctioning Supplemental O2 for SPO2 goal greater than or equal to 90% HOB elevated Remains high risk for reintubation due to secretion clearance of mental status   Acute Encephalopathy - In the setting of delirium, metabolic, digoxin toxicity, renal failure.  -He is alert to verbal and looking around however not following commands Plan: Minimize sedation  Delirium precautions  Goal to attempt to get out of bed/PT today Discontinue Robaxin and oxycodone  Digoxin Toxicity - level on 12/18 was 9. Was s/p 2 units digibind, 4.1>3.2>3.2 Plan: Dig levels downtrending  No indications for further dosing of digibind at this time  Unable to utilize dig in the future    AKI superimposed on CKD stage 3 Severe life-threatening hyperkalemia-soft -Due to acute on chronic (stage II-III) renal failure.  Etiology of his renal failure likely includes use of diuretics, poor p.o. intake.  He is on  potassium supplementation as well. Also digoxin  Plan Nephrology following, appreciate assistance Stop CRRT today monitor for renal recovery Not requiring pressors Follow renal function and urine output   Cardiogenic Shock Acute on Chronic systolic and diastolic CHF with CAD Mechanical mitral valve,  Chronic atrial fibrillation AICD/pacer.  -Admitted with severe left bundle branch block, wide-complex EKG, enlarged T waves, severe bradycardia, -Baseline anticoagulation with coumadin  -Suspect wide complex tachycardia secondary to acute electrolyte abnormalities in the setting of heart disease, also with digoxin toxicity. Per HF note his LV lead is not currently on.  Plan: Heart failure team following, appreciate assistance  Continue Heparin drip  Hold aldactone and lopressor  Continue IV amiodarone  DM type 2 Plan SSI -change to Q6H coverage as he is requiring minimal  Best practice:  Diet: tube feeds per recs Pain/Anxiety/Delirium protocol (if indicated):PRN fentanyl  VAP protocol (if indicated): Longer indicated DVT prophylaxis: heparin gtt GI prophylaxis: PPI Glucose control: SSI per protocol Mobility: PT/OT Code Status: Full Family Communication: Will update Disposition: ICU  Labs   CBC: Recent Labs  Lab 08/08/2019 1424 08/31/2019 1639 08/21/19 0436 08/22/19 0538 08/23/19 0213 08/24/19 0301 08/25/19 0538  WBC 19.1*   < > 9.4 10.0 11.7* 13.1* 12.2*  NEUTROABS 17.0*  --   --   --   --   --   --   HGB 11.2*  9.6*  11.2*  --  8.3* 8.4* 8.4* 8.5* 8.1*  HCT 33.0*  32.7*  33.0*  --  29.4* 30.0* 29.4* 30.1* 29.0*  MCV 71.1*   < > 74.6* 74.6* 74.2* 74.3* 74.7*  PLT 233   < > 163 161 142* 147* 126*   < > = values in this interval not displayed.    Basic Metabolic Panel: Recent Labs  Lab 08/21/19 0436 08/22/19 0538 08/23/19 0213 08/23/19 1615 08/24/19 0301 08/24/19 1640 08/25/19 0538  NA 135 136 138 137 134*  135 135 137  136  K 3.9 4.0 4.0 3.7 3.9  3.9  3.9 3.9  3.9  CL 103 103 104 104 102  103 102 102  101  CO2 25 26 24 24 24  25 23 26  26   GLUCOSE 104* 107* 107* 131* 124*  122* 169* 124*  122*  BUN 19 14 14 16  22*  21* 22* 23*  24*  CREATININE 0.73 0.85 1.00 1.17 1.04  0.93 1.07 0.97  0.98  CALCIUM 7.5* 8.3* 8.2* 7.8* 8.2*  8.3* 8.4* 8.5*  8.5*  MG 2.3 2.4 2.6*  --  2.3  --  2.4  PHOS 1.6* 1.8* 2.1* 2.9 2.3* 2.8 2.0*  1.9*   GFR: Estimated Creatinine Clearance: 72.1 mL/min (by C-G formula based on SCr of 0.98 mg/dL). Recent Labs  Lab 08/21/2019 1424 08/28/2019 1624 08/22/19 0538 08/23/19 0213 08/24/19 0301 08/25/19 0538  WBC 19.1*  --  10.0 11.7* 13.1* 12.2*  LATICACIDVEN 1.8 2.0*  --   --   --   --     Liver Function Tests: Recent Labs  Lab 08/11/2019 0048 08/24/2019 0401 08/23/19 0213 08/23/19 1615 08/24/19 0301 08/24/19 1640 08/25/19 0538  AST 34 25  --   --   --   --   --   ALT 16 15  --   --   --   --   --   ALKPHOS 54 59  --   --   --   --   --   BILITOT 0.5 1.0  --   --   --   --   --   PROT 5.3* 5.4*  --   --   --   --   --   ALBUMIN 2.3* 2.4* 2.3* 2.1* 2.2* 2.2* 2.2*    ABG    Component Value Date/Time   PHART 7.358 08/20/2019 0129   PCO2ART 53.7 (H) 08/20/2019 0129   PO2ART 95.0 08/20/2019 0129   HCO3 30.4 (H) 08/20/2019 0129   TCO2 32 08/20/2019 0129   ACIDBASEDEF 1.0 01/26/2015 0425   O2SAT 76.4 08/25/2019 0538     Coagulation Profile: Recent Labs  Lab 08/30/2019 0556 08/30/2019 1234 08/21/19 0436 08/21/19 0615 08/21/19 1152  INR 2.4* 2.2* 6.5* 5.7* 2.4*    CBG: Recent Labs  Lab 08/24/19 2036 08/24/19 2323 08/25/19 0324 08/25/19 0745 08/25/19 0748  GLUCAP 125* 68* 70 56* 131*   The patient is critically ill with 35. He requires ICU for high complexity decision making, titration of high alert medications, high risk for reintubation, titration of oxygen and interpretation of advanced monitoring.    I personally spent 35 minutes providing critical care services including  personally reviewing test results, discussing care with nursing staff/other physicians and completing orders pertaining to this patient.  Time was exclusive to the patient and does not include time spent teaching or in procedures.  Voice recognition software was used in the production of this  record.  Errors in interpretation may have been inadvertently missed during review.  Francine Graven, MSN, AGACNP  [facility]  Attending Note:  58 year old male with extensive PMH who was extubated on 12/23.  Overnight, cough has been very weak but patient is waking up more.  On exam, weak cough with coarse BS diffusely.  I reviewed CXR myself, edema noted.  Discussed with PCCM-NP.  Will hold in the ICU for monitoring given weak cough and concern for evolving respiratory failure.  Hold off intubation for now.  CRRT per renal.  NTS to PRN.  PCCM will continue to manage.  The patient is critically ill with multiple organ systems failure and requires high complexity decision making for assessment and support, frequent evaluation and titration of therapies, application of advanced monitoring technologies and extensive interpretation of multiple databases.   Critical Care Time devoted to patient care services described in this note is  32  Minutes. This time reflects time of care of this signee Dr Jennet Maduro. This critical care time does not reflect procedure time, or teaching time or supervisory time of PA/NP/Med student/Med Resident etc but could involve care discussion time.  Rush Farmer, M.D. [facility]/Critical Care Medicine.

## 2019-08-26 LAB — POCT I-STAT 7, (LYTES, BLD GAS, ICA,H+H)
Acid-Base Excess: 4 mmol/L — ABNORMAL HIGH (ref 0.0–2.0)
Bicarbonate: 29.2 mmol/L — ABNORMAL HIGH (ref 20.0–28.0)
Calcium, Ion: 1.23 mmol/L (ref 1.15–1.40)
HCT: 29 % — ABNORMAL LOW (ref 39.0–52.0)
Hemoglobin: 9.9 g/dL — ABNORMAL LOW (ref 13.0–17.0)
O2 Saturation: 98 %
Potassium: 4.2 mmol/L (ref 3.5–5.1)
Sodium: 134 mmol/L — ABNORMAL LOW (ref 135–145)
TCO2: 31 mmol/L (ref 22–32)
pCO2 arterial: 44.7 mmHg (ref 32.0–48.0)
pH, Arterial: 7.424 (ref 7.350–7.450)
pO2, Arterial: 96 mmHg (ref 83.0–108.0)

## 2019-08-26 LAB — RENAL FUNCTION PANEL
Albumin: 2 g/dL — ABNORMAL LOW (ref 3.5–5.0)
Anion gap: 10 (ref 5–15)
BUN: 48 mg/dL — ABNORMAL HIGH (ref 6–20)
CO2: 25 mmol/L (ref 22–32)
Calcium: 8.4 mg/dL — ABNORMAL LOW (ref 8.9–10.3)
Chloride: 100 mmol/L (ref 98–111)
Creatinine, Ser: 1.82 mg/dL — ABNORMAL HIGH (ref 0.61–1.24)
GFR calc Af Amer: 46 mL/min — ABNORMAL LOW (ref >60)
GFR calc non Af Amer: 40 mL/min — ABNORMAL LOW (ref >60)
Glucose, Bld: 115 mg/dL — ABNORMAL HIGH (ref 70–99)
Phosphorus: 3.5 mg/dL (ref 2.5–4.6)
Potassium: 4.1 mmol/L (ref 3.5–5.1)
Sodium: 135 mmol/L (ref 135–145)

## 2019-08-26 LAB — GLUCOSE, CAPILLARY
Glucose-Capillary: 111 mg/dL — ABNORMAL HIGH (ref 70–99)
Glucose-Capillary: 116 mg/dL — ABNORMAL HIGH (ref 70–99)
Glucose-Capillary: 142 mg/dL — ABNORMAL HIGH (ref 70–99)
Glucose-Capillary: 163 mg/dL — ABNORMAL HIGH (ref 70–99)
Glucose-Capillary: 131 mg/dL — ABNORMAL HIGH (ref 70–99)

## 2019-08-26 LAB — PROTIME-INR
INR: 1.5 — ABNORMAL HIGH (ref 0.8–1.2)
Prothrombin Time: 18.3 seconds — ABNORMAL HIGH (ref 11.4–15.2)

## 2019-08-26 LAB — COMPREHENSIVE METABOLIC PANEL
ALT: 18 U/L (ref 0–44)
AST: 11 U/L — ABNORMAL LOW (ref 15–41)
Albumin: 2 g/dL — ABNORMAL LOW (ref 3.5–5.0)
Alkaline Phosphatase: 66 U/L (ref 38–126)
Anion gap: 10 (ref 5–15)
BUN: 59 mg/dL — ABNORMAL HIGH (ref 6–20)
CO2: 26 mmol/L (ref 22–32)
Calcium: 8.6 mg/dL — ABNORMAL LOW (ref 8.9–10.3)
Chloride: 100 mmol/L (ref 98–111)
Creatinine, Ser: 1.82 mg/dL — ABNORMAL HIGH (ref 0.61–1.24)
GFR calc Af Amer: 46 mL/min — ABNORMAL LOW (ref >60)
GFR calc non Af Amer: 40 mL/min — ABNORMAL LOW (ref >60)
Glucose, Bld: 152 mg/dL — ABNORMAL HIGH (ref 70–99)
Potassium: 4.3 mmol/L (ref 3.5–5.1)
Sodium: 136 mmol/L (ref 135–145)
Total Bilirubin: 0.2 mg/dL — ABNORMAL LOW (ref 0.3–1.2)
Total Protein: 5 g/dL — ABNORMAL LOW (ref 6.5–8.1)

## 2019-08-26 LAB — COOXEMETRY PANEL
Carboxyhemoglobin: 0.8 % (ref 0.5–1.5)
Methemoglobin: 0.3 % (ref 0.0–1.5)
O2 Saturation: 96.4 %
Total hemoglobin: 8 g/dL — ABNORMAL LOW (ref 12.0–16.0)

## 2019-08-26 LAB — CBC
HCT: 27.9 % — ABNORMAL LOW (ref 39.0–52.0)
Hemoglobin: 7.9 g/dL — ABNORMAL LOW (ref 13.0–17.0)
MCH: 20.8 pg — ABNORMAL LOW (ref 26.0–34.0)
MCHC: 28.3 g/dL — ABNORMAL LOW (ref 30.0–36.0)
MCV: 73.4 fL — ABNORMAL LOW (ref 80.0–100.0)
Platelets: 147 10*3/uL — ABNORMAL LOW (ref 150–400)
RBC: 3.8 MIL/uL — ABNORMAL LOW (ref 4.22–5.81)
RDW: 20.4 % — ABNORMAL HIGH (ref 11.5–15.5)
WBC: 12.3 10*3/uL — ABNORMAL HIGH (ref 4.0–10.5)
nRBC: 0.5 % — ABNORMAL HIGH (ref 0.0–0.2)

## 2019-08-26 LAB — AMMONIA: Ammonia: 28 umol/L (ref 9–35)

## 2019-08-26 LAB — MAGNESIUM: Magnesium: 2.5 mg/dL — ABNORMAL HIGH (ref 1.7–2.4)

## 2019-08-26 MED ORDER — POLYETHYLENE GLYCOL 3350 17 G PO PACK
17.0000 g | PACK | Freq: Every day | ORAL | Status: DC | PRN
  Administered 2019-08-27: 09:00:00 17 g
  Filled 2019-08-26: qty 1

## 2019-08-26 MED ORDER — AMIODARONE HCL 200 MG PO TABS
200.0000 mg | ORAL_TABLET | Freq: Two times a day (BID) | ORAL | Status: DC
  Administered 2019-08-26 – 2019-09-02 (×15): 200 mg
  Filled 2019-08-26 (×15): qty 1

## 2019-08-26 MED ORDER — NOREPINEPHRINE 16 MG/250ML-% IV SOLN
0.0000 ug/min | INTRAVENOUS | Status: DC
  Administered 2019-08-26: 8 ug/min via INTRAVENOUS
  Filled 2019-08-26 (×2): qty 250

## 2019-08-26 MED ORDER — DIPHENHYDRAMINE HCL 12.5 MG/5ML PO ELIX
25.0000 mg | ORAL_SOLUTION | Freq: Once | ORAL | Status: AC
  Administered 2019-08-26: 08:00:00 25 mg
  Filled 2019-08-26: qty 10

## 2019-08-26 MED ORDER — SODIUM CHLORIDE 0.9 % IV SOLN
25.0000 mg | Freq: Once | INTRAVENOUS | Status: AC
  Administered 2019-08-26: 25 mg via INTRAVENOUS
  Filled 2019-08-26: qty 2

## 2019-08-26 MED ORDER — WARFARIN SODIUM 5 MG PO TABS: 5.0000 mg | ORAL_TABLET | Freq: Once | ORAL | Status: DC

## 2019-08-26 MED ORDER — AMIODARONE HCL 200 MG PO TABS: 200.0000 mg | ORAL_TABLET | Freq: Two times a day (BID) | ORAL | Status: DC

## 2019-08-26 MED ORDER — WARFARIN SODIUM 5 MG PO TABS
5.0000 mg | ORAL_TABLET | Freq: Once | ORAL | Status: AC
  Administered 2019-08-26: 5 mg
  Filled 2019-08-26: qty 1

## 2019-08-26 MED ORDER — MIDODRINE HCL 5 MG PO TABS
10.0000 mg | ORAL_TABLET | Freq: Two times a day (BID) | ORAL | Status: DC
  Administered 2019-08-26: 10 mg
  Filled 2019-08-26: qty 2

## 2019-08-26 MED ORDER — MIDODRINE HCL 5 MG PO TABS
10.0000 mg | ORAL_TABLET | Freq: Three times a day (TID) | ORAL | Status: DC
  Administered 2019-08-26 – 2019-08-27 (×3): 10 mg
  Filled 2019-08-26 (×3): qty 2

## 2019-08-26 MED ORDER — HYDROCORTISONE NA SUCCINATE PF 100 MG IJ SOLR
50.0000 mg | Freq: Four times a day (QID) | INTRAMUSCULAR | Status: DC
  Administered 2019-08-26 – 2019-08-29 (×13): 50 mg via INTRAVENOUS
  Filled 2019-08-26 (×13): qty 2

## 2019-08-26 NOTE — Progress Notes (Signed)
Advanced Heart Failure Rounding Note   Subjective:    CVVHD stopped 12/24. Making minimal urine. CVP is now 7. CR 1.4-> 1.8    On NE 8 for hypotension. Renal added midodrine this am. Holding diuretics. Weight way down.  Co-ox 96% (inaccurate)  C/o belly pain. Incontinent of stool. Very weak. On heparin and IV amio.    Objective:   Weight Range:  Vital Signs:   Temp:  [97.6 F (36.4 C)-100 F (37.8 C)] 97.8 F (36.6 C) (12/25 0700) Pulse Rate:  [71-95] 82 (12/25 0600) Resp:  [11-28] 26 (12/25 0600) BP: (92)/(60) 92/60 (12/24 1330) SpO2:  [89 %-100 %] 93 % (12/25 0600) Arterial Line BP: (72-125)/(29-71) 87/71 (12/25 0600) Weight:  [65.4 kg] 65.4 kg (12/25 0341) Last BM Date: 08/26/19  Weight change: Filed Weights   08/24/19 0310 08/25/19 0500 08/26/19 0341  Weight: 73.7 kg 72.4 kg 65.4 kg    Intake/Output:   Intake/Output Summary (Last 24 hours) at 08/26/2019 0819 Last data filed at 08/26/2019 0600 Gross per 24 hour  Intake 1793.67 ml  Output 125 ml  Net 1668.67 ml     Physical Exam: General:  Appears chronically ill.  No resp difficulty HEENT: normal Neck: supple. RIJ wound healing. LIJ trialysis. Carotids 2+ bilat; no bruits. No lymphadenopathy or thryomegaly appreciated. Cor: PMI nondisplaced. Irregular rate & rhythm. Mech s2. s3 2/6 SEM  Lungs: clear Abdomen: soft, nontender, nondistended. No hepatosplenomegaly. No bruits or masses. Good bowel sounds. Extremities: no cyanosis, clubbing, rash, 2+ edema Neuro: weak. Moves all 4.    Telemetry: A fib 80s with PVCs Personally reviewed   Labs: Basic Metabolic Panel: Recent Labs  Lab 08/22/19 0538 08/23/19 0213 08/24/19 0301 08/24/19 1640 08/25/19 0538 08/25/19 1542 08/26/19 0356  NA 136 138 134*   135 135 137   136 135 135  K 4.0 4.0 3.9   3.9 3.9 3.9   3.9 4.0 4.1  CL 103 104 102   103 102 102   101 102 100  CO2 26 24 24   25 23 26   26 25 25   GLUCOSE 107* 107* 124*   122* 169* 124*   122*  113* 115*  BUN 14 14 22*   21* 22* 23*   24* 35* 48*  CREATININE 0.85 1.00 1.04   0.93 1.07 0.97   0.98 1.46* 1.82*  CALCIUM 8.3* 8.2* 8.2*   8.3* 8.4* 8.5*   8.5* 8.0* 8.4*  MG 2.4 2.6* 2.3  --  2.4  --  2.5*  PHOS 1.8* 2.1* 2.3* 2.8 2.0*   1.9* 3.6 3.5    Liver Function Tests: Recent Labs  Lab 08/24/19 0301 08/24/19 1640 08/25/19 0538 08/25/19 1542 08/26/19 0356  ALBUMIN 2.2* 2.2* 2.2* 2.1* 2.0*   No results for input(s): LIPASE, AMYLASE in the last 168 hours. No results for input(s): AMMONIA in the last 168 hours.  CBC: Recent Labs  Lab 08/22/19 0538 08/23/19 0213 08/24/19 0301 08/25/19 0538 08/26/19 0356  WBC 10.0 11.7* 13.1* 12.2* 12.3*  HGB 8.4* 8.4* 8.5* 8.1* 7.9*  HCT 30.0* 29.4* 30.1* 29.0* 27.9*  MCV 74.6* 74.2* 74.3* 74.7* 73.4*  PLT 161 142* 147* 126* 147*    Cardiac Enzymes: No results for input(s): CKTOTAL, CKMB, CKMBINDEX, TROPONINI in the last 168 hours.  BNP: BNP (last 3 results) Recent Labs    07/19/19 0956 08/02/19 0957 09/01/2019 1424  BNP 298.7* 167.3* 106.1*    ProBNP (last 3 results) No results for input(s):  PROBNP in the last 8760 hours.    Other results:  Imaging: DG Abd Portable 1V  Result Date: 08/24/2019 CLINICAL DATA:  Encounter for gastric tube placement. EXAM: PORTABLE ABDOMEN - 1 VIEW COMPARISON:  08/20/2019. FINDINGS: Surgical clips right upper quadrant. Surgical clips right groin. NG tube noted, its tip is kinked in the stomach. No gastric or bowel distention. No free air. Degenerative change lumbar spine and both hips. IMPRESSION: NG tube, its tip is kinked in the stomach. No gastric or bowel distention. Electronically Signed   By: Marcello Moores  Register   On: 08/24/2019 15:27     Medications:     Scheduled Medications:  albuterol  2.5 mg Nebulization TID   amiodarone  150 mg Intravenous Once   chlorhexidine  15 mL Mouth Rinse BID   Chlorhexidine Gluconate Cloth  6 each Topical Q0600   darbepoetin (ARANESP)  injection - NON-DIALYSIS  100 mcg Subcutaneous Q Wed-1800   enoxaparin (LOVENOX) injection  1 mg/kg Subcutaneous Q12H   feeding supplement (PRO-STAT SUGAR FREE 64)  30 mL Per Tube BID   hydroxypropyl methylcellulose / hypromellose  1 drop Both Eyes TID   insulin aspart  2-6 Units Subcutaneous Q6H   mouth rinse  15 mL Mouth Rinse q12n4p   midodrine  10 mg Per Tube BID WC   pantoprazole sodium  40 mg Per Tube Daily   polyethylene glycol  17 g Oral Daily   Warfarin - Pharmacist Dosing Inpatient   Does not apply q1800    Infusions:  sodium chloride 10 mL/hr at 08/26/19 0600   amiodarone 30 mg/hr (08/26/19 0600)   feeding supplement (JEVITY 1.5 CAL/FIBER) Stopped (08/25/19 2002)   ferric gluconate (FERRLECIT/NULECIT) Test Dose     norepinephrine (LEVOPHED) Adult infusion 8 mcg/min (08/26/19 0600)   prismasol BGK 4/2.5 1,500 mL/hr at 08/25/19 0654    PRN Medications: sodium chloride, bisacodyl, heparin, sennosides   Assessment/Plan:   1. AKI on CKD stage 3: Baseline creatinine 1.4-1.6 Creatinine up to 2.7 at admission with marked hyperkalemia and hyponatremia in setting of use of torsemide, metolazone, and spironolactone at home.  He had urgent HD with improvement.  - CVVHD stopped 12/24 Making minimal urine. CVP is now 7. CR 1.4-> 1.8  - On NE 8 mcg. Midodrine added,  - Renal following 2. Acute on chronic systolic CHF/cardiogenic shock: EF 30-35% on last echo.  [employer] CRT-D, LV not on due to diaphragmatic stimulation.  Hypotension in setting of AKI/hyperkalemia. N up to 8    - Volume overload improved with CVVHD. Baseline weight about 80kg (currently 65.4 kg ) - Co-ox has been good. (inaccurate today) - CVVHD stopped 12/24 to assess for renal recovery. U/o minimal. CVP 7 - Holding HD today.  - BP too low for other HF meds.  3. VT: Patient has [employer] CRT-D device, LV lead off due to diaphragmatic pacing.  Patient has has runs of VT, now on  amiodarone gtt.  - VT quiescent - Change amio to 200 po bid - Keep K > 4.0 and Mg > 2.0 (K 4.0 and MG 2.6 today) 4. Digoxin toxicity: Severely elevated digoxin level, has had digibind and HD.  - bradycardia improved 5. CAD: Last cath in 2016 with patent native coronaries, probably occluded SVG-RCA.  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  - Continue heparin/warfarin. Hgb 7.9. INR 1.5 Stop heparin with INR in 2.2-2.3 range 7. Atrial fibrillation: Chronic.   - rate controlled 80-90s - heparin  as above  - switch amio to po 8. Acute hypoxic respiratory failure -Extubated on 2 liters   9. Ab pain - unclear etiology. No rebound. KUB 12/23 ok - CCM following  Overall prognosis very guarded. Discussed with his wife in detail.   CRITICAL CARE Performed by: Glori Bickers  Total critical care time: 35 minutes  Critical care time was exclusive of separately billable procedures and treating other patients.  Critical care was necessary to treat or prevent imminent or life-threatening deterioration.  Critical care was time spent personally by me (independent of midlevel providers or residents) on the following activities: development of treatment plan with patient and/or surrogate as well as nursing, discussions with consultants, evaluation of patient's response to treatment, examination of patient, obtaining history from patient or surrogate, ordering and performing treatments and interventions, ordering and review of laboratory studies, ordering and review of radiographic studies, pulse oximetry and re-evaluation of patient's condition.   Length of Stay: 8   Glori Bickers MD 08/26/2019, 8:19 AM  Advanced Heart Failure Team Pager [phone] (M-F; [place])  Please contact [facility] for night-coverage after hours (4p -7a ) and weekends on [email or web address]

## 2019-08-26 NOTE — Progress Notes (Addendum)
NAME:  Lance Cook, MRN:  [id number], DOB:  27-Dec-1960, LOS: 8 ADMISSION DATE:  08/17/2019, CONSULTATION DATE:  08/22/2019 REFERRING MD:  Dr Sherry Ruffing, CHIEF COMPLAINT:  Hyperkalemia    Brief History   58 yo male found to have severe life-threatening hyperkalemia, PCCM consulted for admission and need for urgent iHD.  History of present illness   58 year old man, history of Bentall procedure, MVR, atrial fibrillation, CAD with chronic systolic and diastolic CHF.  Came to the ER with fatigue, weakness and hypotension.  He was found to be profoundly bradycardic with severe left bundle branch block and QRS widening on EKG.  Found to be severely hyperkalemic with new acute renal failure.  He is on digoxin at baseline and his digoxin level was 2.6.  He will be admitted emergently to the ICU for hemodialysis. He was given digibind for dig toxicity.   Significant Hospital Events   12/17 Admitted, urgent HD 12/18 had HD catheter in carotid removed in OR. Returned intubated  Consults:  Nephrology Heart Failure  Procedures:  R IJ HD catheter 12/17 >> Misplaced in carotid, removed in OR 12/18  L IJ HD catheter 12/17 >>  ETI 12/18>>12/23  Significant Diagnostic Tests:  NM cardiac amyloid scan 10/21 >> equivocal for any evidence amyloidosis  ECHO 12/18 >  1. Left ventricular ejection fraction, by visual estimation, is 30 to 35%. The left ventricle has severely decreased function. Severely increased left ventricular size. There is no left ventricular hypertrophy.  2. Definity contrast agent was given IV to delineate the left ventricular endocardial borders.  3. Global hypokinesis, though function more preserved at base than apex. LV contrast excludes apical thrombus.  4. Global right ventricle has normal systolic function.The right ventricular size is normal. No increase in right ventricular wall thickness.  5. Left atrial size was normal.  6. Right atrial size was normal.  7. The mitral valve has  been repaired/replaced. Mild mitral valve regurgitation. Moderate mitral stenosis.  8. The tricuspid valve is normal in structure. Tricuspid valve regurgitation mild-moderate.  9. 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 10. The aortic valve is normal in structure. Aortic valve regurgitation is trivial by color flow Doppler. Structurally normal aortic valve, with no evidence of sclerosis or stenosis. 11. Aortic valve with trivial perivalvular leak. 12. The pulmonic valve was grossly normal. Pulmonic valve regurgitation is not visualized by color flow Doppler. 13. S/P Bentall. 14. Moderately elevated pulmonary artery systolic pressure. 15. A pacer wire is visualized in the RV and RA. 16. The inferior vena cava is dilated in size with <50% respiratory variability, suggesting right atrial pressure of 15 mmHg. 17. History of mechanical aortic and mitral valves. Leaflets not well visualized, could consider fluroscopy of leaflets, but gradients not suggestive of obstruction.  CXR 12/17 > New left internal jugular central line tip in the SVC above the right atrium. No pneumothorax. No other change.  Micro Data:  COVID 12/17 > negative  Blood cultures 12/17 >NGTD   MRSA PCR 12/17 >negative  Urine culture 12/18 >  Negative  Antimicrobials:  Cefazolin 12/18 SCIP   Interim history/subjective:  Extubated 12/23  Objective   Blood pressure 92/60, pulse 82, temperature 97.8 F (36.6 C), temperature source Oral, resp. rate (!) 26, height 5' 2.21" (1.58 m), weight 65.4 kg, SpO2 98 %. CVP:  [6 mmHg-10 mmHg] 10 mmHg      Intake/Output Summary (Last 24 hours) at 08/26/2019 0913 Last data filed at 08/26/2019 0600 Gross per 24 hour  Intake 1690.79 ml  Output 125 ml  Net 1565.79 ml   Filed Weights   08/24/19 0310 08/25/19 0500 08/26/19 0341  Weight: 73.7 kg 72.4 kg 65.4 kg    Examination: General: Acute and chronically ill appearing M. Appears older than stated age. NAD HENT:  Temporal muscle wasting. Anicteric sclera. Trachea midline  CV: rate controlled AF. Mechanical s2 sounds. Systolic murmur 2/6. Cap refill < 3 seconds  Lungs: Bilateral rhonchi. No accessory muscle use on 2L. Symmetrical chest expansion  ABD: soft flat ndnt. + bowel sounds x4 GU: Foley  EXT: Symmetrical thigh edema. No cyanosis or clubbing  Skin: Pale, clean, dry, warm  Neuro: Awake, alert. Following commands. [facility] Problem list   Coagulopathy with elevated INR Hyperkalemia  Assessment & Plan:  Mr. Reem is a 58 yo gentleman with significant cardiac history including HFrEF, s/p MVR, s/p Bentall procedure who presents with:  Acute hypoxemic respiratory failure -Extubated 12/23 -Difficulty in clearing secretions Plan:  Aggressive bronchial hygiene, NT suctioning Supplemental O2 for SPO2 goal greater than or equal to 90% HOB elevated IS, flutter   Acute encephalopathy - In the setting of delirium, metabolic, digoxin toxicity, renal failure.  -He is alert to verbal and looking around however not following commands Plan: Minimize CNS depressing medications Delirium precautions  Goal OOB, PT/OT   AKI on CKD 3 Acute renal failure requiring renal replacement therapy  - Etiology of his renal failure likely includes use of diuretics, poor p.o. intake, shock.  Plan Nephrology following; off CRRT at present and watching for possible renal recovery  Trend renal indices and electrolytes   Cardiogenic Shock Acute on Chronic systolic and diastolic CHF (EF 99991111) CAD Mechanical mitral valve Chronic atrial fibrillation VT s/p AICD/pacer  -Admitted with severe left bundle branch block, wide-complex EKG, enlarged T waves, severe bradycardia in setting of Digoxin toxicity -Baseline anticoagulation with coumadin  -Per HF note his LV lead is not currently on.  Plan: Appreciate Heart Failure team recs NE for MAP goal Continue midodrine Heparin drip, converting to  lovenox + warfarin  IV Amiodarone to PO 200mg  BID  Mag goal >2 K goal >4  Digoxin Toxicity - level on 12/18 was 9. Was s/p 2 units digibind, 4.1>3.2>3.2 Plan: Dig levels downtrending  No indications for further dosing of digibind at this time  Unable to utilize dig in the future    DM type 2 Plan SSI  Anemia Plan Supplemental iron per nephrology Transfuse per unit protocol   Deconditioning -chronic and critical illness Plan PT/OT   Best practice:  Diet: tube feeds per recs Pain/Anxiety/Delirium protocol (if indicated): na VAP protocol (if indicated): na DVT prophylaxis: warfarin + lovenox  GI prophylaxis: PPI Glucose control: SSI Mobility: PT/OT Code Status: Full Family Communication: Patient updated at bedside  Disposition: ICU  Labs   CBC: Recent Labs  Lab 08/22/19 0538 08/23/19 0213 08/24/19 0301 08/25/19 0538 08/26/19 0356  WBC 10.0 11.7* 13.1* 12.2* 12.3*  HGB 8.4* 8.4* 8.5* 8.1* 7.9*  HCT 30.0* 29.4* 30.1* 29.0* 27.9*  MCV 74.6* 74.2* 74.3* 74.7* 73.4*  PLT 161 142* 147* 126* 147*    Basic Metabolic Panel: Recent Labs  Lab 08/22/19 0538 08/23/19 0213 08/24/19 0301 08/24/19 1640 08/25/19 0538 08/25/19 1542 08/26/19 0356  NA 136 138 134*  135 135 137  136 135 135  K 4.0 4.0 3.9  3.9 3.9 3.9  3.9 4.0 4.1  CL 103 104 102  103 102 102  101  102 100  CO2 26 24 24  25 23 26  26 25 25   GLUCOSE 107* 107* 124*  122* 169* 124*  122* 113* 115*  BUN 14 14 22*  21* 22* 23*  24* 35* 48*  CREATININE 0.85 1.00 1.04  0.93 1.07 0.97  0.98 1.46* 1.82*  CALCIUM 8.3* 8.2* 8.2*  8.3* 8.4* 8.5*  8.5* 8.0* 8.4*  MG 2.4 2.6* 2.3  --  2.4  --  2.5*  PHOS 1.8* 2.1* 2.3* 2.8 2.0*  1.9* 3.6 3.5   GFR: Estimated Creatinine Clearance: 34.5 mL/min (A) (by C-G formula based on SCr of 1.82 mg/dL (H)). Recent Labs  Lab 08/23/19 0213 08/24/19 0301 08/25/19 0538 08/26/19 0356  WBC 11.7* 13.1* 12.2* 12.3*    Liver Function Tests: Recent Labs  Lab  08/24/19 0301 08/24/19 1640 08/25/19 0538 08/25/19 1542 08/26/19 0356  ALBUMIN 2.2* 2.2* 2.2* 2.1* 2.0*    ABG    Component Value Date/Time   PHART 7.358 08/20/2019 0129   PCO2ART 53.7 (H) 08/20/2019 0129   PO2ART 95.0 08/20/2019 0129   HCO3 30.4 (H) 08/20/2019 0129   TCO2 32 08/20/2019 0129   ACIDBASEDEF 1.0 01/26/2015 0425   O2SAT 96.4 08/26/2019 0342     Coagulation Profile: Recent Labs  Lab 08/21/2019 1234 08/21/19 0436 08/21/19 0615 08/21/19 1152 08/26/19 0356  INR 2.2* 6.5* 5.7* 2.4* 1.5*    CBG: Recent Labs  Lab 08/25/19 1755 08/25/19 1958 08/25/19 2330 08/26/19 0333 08/26/19 0820  GLUCAP 128* 111* 148* 111* 116*   CRITICAL CARE Performed by: Cristal Generous   Total critical care time: 40 minutes  Critical care time was exclusive of separately billable procedures and treating other patients.  Critical care was necessary to treat or prevent imminent or life-threatening deterioration.  Critical care was time spent personally by me on the following activities: development of treatment plan with patient and/or surrogate as well as nursing, discussions with consultants, evaluation of patient's response to treatment, examination of patient, obtaining history from patient or surrogate, ordering and performing treatments and interventions, ordering and review of laboratory studies, ordering and review of radiographic studies, pulse oximetry and re-evaluation of patient's condition.  Eliseo Gum MSN, AGACNP-BC [place] [id number] If no answer, [id number] 08/26/2019, 9:13 AM  Attending Note:  58 year old male with failure to thrive and chronic heart failure presenting with cardiogenic shock, respiratory failure and renal failure.  Patient was extubated 2 days ago but airway protection is questionable at best on exam with coarse BS.  I reviewed CXR myself, pulmonary edema noted.  Discussed with PCCM-NP.  Will order an SLP today,  if passes then remove NGT, if fails then place the smallest bore NGT available.  Start stress dose steroids.  Increase midodrine to 10 TID from BID.  Attempt to get of levophed today.  Encourage PO intake given failure to thrive that is chronic.  PCCM will continue to manage.  The patient is critically ill with multiple organ systems failure and requires high complexity decision making for assessment and support, frequent evaluation and titration of therapies, application of advanced monitoring technologies and extensive interpretation of multiple databases.   Critical Care Time devoted to patient care services described in this note is  31  Minutes. This time reflects time of care of this signee Dr Jennet Maduro. This critical care time does not reflect procedure time, or teaching time or supervisory time of PA/NP/Med student/Med Resident etc but could involve care discussion time.  Rush Farmer, M.D. [facility] Division Pulmonary/Critical Care Medicine

## 2019-08-26 NOTE — Progress Notes (Signed)
ANTICOAGULATION CONSULT NOTE - Follow Up Consult  Pharmacy Consult for Heparin>>lovenox + warfarin Indication: Hx St Jude MVR/AVR + Afib  Patient Measurements: Height: 5' 2.21" (158 cm) Weight: 144 lb 2.9 oz (65.4 kg) IBW/kg (Calculated) : 55.07 Heparin Dosing Weight: 70 kg  Vital Signs: Temp: 97.8 F (36.6 C) (12/25 0700) Temp Source: Oral (12/25 0700) Pulse Rate: 82 (12/25 0600)  Labs: Recent Labs    08/24/19 0301 08/24/19 0301 08/24/19 1000 08/24/19 1900 08/25/19 0203 08/25/19 0538 08/25/19 0930 08/25/19 1542 08/26/19 0356  HGB 8.5*  --   --   --   --  8.1*  --   --  7.9*  HCT 30.1*  --   --   --   --  29.0*  --   --  27.9*  PLT 147*  --   --   --   --  126*  --   --  147*  LABPROT  --   --   --   --   --   --   --   --  18.3*  INR  --   --   --   --   --   --   --   --  1.5*  HEPARINUNFRC 0.20*  --   --  0.22* 0.22*  --  0.25*  --   --   CREATININE 1.04  0.93   < >   < >  --   --  0.97  0.98  --  1.46* 1.82*   < > = values in this interval not displayed.    Estimated Creatinine Clearance: 34.5 mL/min (A) (by C-G formula based on SCr of 1.82 mg/dL (H)).  Assessment: 58 yr old male on warfarin PTA for hx St Jude MVR + Afib - admitted with AKI and hyperkalemia requiring line replacement and start of CRRT. FFP given and heparin bridge started when INR<2.5. Pharmacy consulted to dose heparin.  PTA warfarin dose: 5mg  daily  INR subtherapeutic at 1.5 s/p warfarin restart yesterday.  CBC stable, making more urine off CRRT and CrCl ok to continue lovenox for today.  Currently on tube feeds.    Goal of Therapy:  INR 2.5-3.5 Monitor platelets by anticoagulation protocol: Yes   Plan:  Continue Lovenox 1mg /kg SQ (70mg ) every 12 hours Warfarin 5mg  PO x 1 tonight Daily INR, CBC, s/s bleeding F/u renal function off CRRT and ability to continue lovenox bridge  Bertis Ruddy, PharmD Clinical Pharmacist Please check AMION for all [facility] numbers 08/26/2019 8:53  AM

## 2019-08-26 NOTE — Progress Notes (Signed)
[place] Progress Note Patient Name: Lance Cook DOB: Oct 13, 1960 MRN: [id number]   Date of Service  08/26/2019  HPI/Events of Note  Patient reportedly more lethargic, no history of sedation . On examination he is alert and oriented x  3, and his physical exam is non-focal, I did update his wife regarding Dr. Maud Deed note from this morning and I answered all her questions.  eICU Interventions  Will check ABG, CMET and ammonia levels, based on the neurologic and mental status exam CT scan does not appear to be indicated at this time.        [name] 08/26/2019, 8:19 PM

## 2019-08-26 NOTE — Progress Notes (Signed)
[facility] NEPHROLOGY PROGRESS NOTE  Assessment/ Plan: Pt is a 58 y.o. yo male CHF, mechanical mitral and aortic valve A. fib, s/p VT arrest, DM, potassium of 8.4 associated with hypotension and AKI on admission.  #Severe hyperkalemia with EKG changes due to use of spironolactone, KCl and AKI: Received urgent dialysis on 12/17, started CRRT on 12/18 for fluid overload- stopped on 12/24.     #Acute on CKD stage III: Baseline creatinine around 1.3-1.6, likely ischemic ATN due to shock.  On CRRT 12/18 to 12/24  with  UF.  CVP is now 7  with dep edema but likely third spacing.  CRRT held 12/24.  Making a little more urine- crt went from 1.4 to 1.8-  No need for additional RRT today- cont to watch  #Acute on chronic hyponatremia: Probably due to hypervolemia/diuretics.  He was on metolazone, torsemide and Aldactone at home.  Sodium level  improved.  #Acute on Chronic CHF, aortic and mitral valve replacement: Per cardiology.  UF with CRRT.  Cont to hold CRRT and watch him-  No diuretics yet though with CVP of 7 and hypotension  #Hypotension/shock: On Levophed.   Off and on pressors.    Will add midodrine   #Acute respiratory failure/VDRF: Intubated, per PCCM.  Extubated on 2 L  Elytes-  K and Phos OK  Anemia- needs replete iron-  Has allergy listed, will try test dose of ferrlecit -   and giving  ESA     Subjective: Extubated, back on pressors overnight -  125 of urine last 24 hours but a good 200 in foley bag-  Looks at me and saying a few words today   Objective Vital signs in last 24 hours: Vitals:   08/26/19 0343 08/26/19 0400 08/26/19 0500 08/26/19 0600  BP:      Pulse:  86 90 82  Resp:  (!) 26 (!) 27 (!) 26  Temp: 98.6 F (37 C)     TempSrc: Oral     SpO2:  98% 97% 93%  Weight:      Height:       Weight change: -7 kg  Intake/Output Summary (Last 24 hours) at 08/26/2019 0628 Last data filed at 08/26/2019 0600 Gross per 24 hour  Intake 1977.04 ml  Output  518 ml  Net 1459.04 ml       Labs: Basic Metabolic Panel: Recent Labs  Lab 08/25/19 0538 08/25/19 1542 08/26/19 0356  NA 137  136 135 135  K 3.9  3.9 4.0 4.1  CL 102  101 102 100  CO2 26  26 25 25   GLUCOSE 124*  122* 113* 115*  BUN 23*  24* 35* 48*  CREATININE 0.97  0.98 1.46* 1.82*  CALCIUM 8.5*  8.5* 8.0* 8.4*  PHOS 2.0*  1.9* 3.6 3.5   Liver Function Tests: Recent Labs  Lab 08/25/19 0538 08/25/19 1542 08/26/19 0356  ALBUMIN 2.2* 2.1* 2.0*   No results for input(s): LIPASE, AMYLASE in the last 168 hours. No results for input(s): AMMONIA in the last 168 hours. CBC: Recent Labs  Lab 08/22/19 0538 08/23/19 0213 08/24/19 0301 08/25/19 0538 08/26/19 0356  WBC 10.0 11.7* 13.1* 12.2* 12.3*  HGB 8.4* 8.4* 8.5* 8.1* 7.9*  HCT 30.0* 29.4* 30.1* 29.0* 27.9*  MCV 74.6* 74.2* 74.3* 74.7* 73.4*  PLT 161 142* 147* 126* 147*   Cardiac Enzymes: No results for input(s): CKTOTAL, CKMB, CKMBINDEX, TROPONINI in the last 168 hours. CBG: Recent Labs  Lab 08/25/19 1116 08/25/19  [place] 08/25/19 1958 08/25/19 2330 08/26/19 0333  GLUCAP 139* 128* 111* 148* 111*    Iron Studies:  Recent Labs    08/25/19 0538  IRON 9*  TIBC 262  FERRITIN 86   Studies/Results: DG Abd Portable 1V  Result Date: 08/24/2019 CLINICAL DATA:  Encounter for gastric tube placement. EXAM: PORTABLE ABDOMEN - 1 VIEW COMPARISON:  08/20/2019. FINDINGS: Surgical clips right upper quadrant. Surgical clips right groin. NG tube noted, its tip is kinked in the stomach. No gastric or bowel distention. No free air. Degenerative change lumbar spine and both hips. IMPRESSION: NG tube, its tip is kinked in the stomach. No gastric or bowel distention. Electronically Signed   By: Marcello Moores  Register   On: 08/24/2019 15:27    Medications: Infusions: . sodium chloride 10 mL/hr at 08/26/19 0600  . amiodarone 30 mg/hr (08/26/19 0600)  . feeding supplement (JEVITY 1.5 CAL/FIBER) Stopped (08/25/19 2002)  .  norepinephrine (LEVOPHED) Adult infusion 8 mcg/min (08/26/19 0600)  . prismasol BGK 4/2.5 1,500 mL/hr at 08/25/19 [id number]    Scheduled Medications: . albuterol  2.5 mg Nebulization TID  . amiodarone  150 mg Intravenous Once  . chlorhexidine  15 mL Mouth Rinse BID  . Chlorhexidine Gluconate Cloth  6 each Topical Q0600  . darbepoetin (ARANESP) injection - NON-DIALYSIS  100 mcg Subcutaneous Q Wed-1800  . enoxaparin (LOVENOX) injection  1 mg/kg Subcutaneous Q12H  . feeding supplement (PRO-STAT SUGAR FREE 64)  30 mL Per Tube BID  . hydroxypropyl methylcellulose / hypromellose  1 drop Both Eyes TID  . insulin aspart  2-6 Units Subcutaneous Q6H  . mouth rinse  15 mL Mouth Rinse q12n4p  . pantoprazole sodium  40 mg Per Tube Daily  . polyethylene glycol  17 g Oral Daily  . Warfarin - Pharmacist Dosing Inpatient   Does not apply q1800    have reviewed scheduled and prn medications.  Physical Exam: General: more alert today, talking, asking for ice chips Heart:RRR, s1s2 nl Lungs: Coarse breath sound bilateral Abdomen:soft,  non-distended Extremities: LE edema + pitting to dep areas Dialysis Access: Left IJ temporary catheter. In place for 7 days   Orazio Weller A Thayden Lemire 08/26/2019,6:28 AM  LOS: 8 days

## 2019-08-27 DIAGNOSIS — R069 Unspecified abnormalities of breathing: Secondary | ICD-10-CM

## 2019-08-27 LAB — CBC
HCT: 30 % — ABNORMAL LOW (ref 39.0–52.0)
Hemoglobin: 8.2 g/dL — ABNORMAL LOW (ref 13.0–17.0)
MCH: 20.5 pg — ABNORMAL LOW (ref 26.0–34.0)
MCHC: 27.3 g/dL — ABNORMAL LOW (ref 30.0–36.0)
MCV: 75 fL — ABNORMAL LOW (ref 80.0–100.0)
Platelets: 151 10*3/uL (ref 150–400)
RBC: 4 MIL/uL — ABNORMAL LOW (ref 4.22–5.81)
RDW: 20.7 % — ABNORMAL HIGH (ref 11.5–15.5)
WBC: 12.4 10*3/uL — ABNORMAL HIGH (ref 4.0–10.5)
nRBC: 0.6 % — ABNORMAL HIGH (ref 0.0–0.2)

## 2019-08-27 LAB — RENAL FUNCTION PANEL
Albumin: 1.9 g/dL — ABNORMAL LOW (ref 3.5–5.0)
Anion gap: 7 (ref 5–15)
BUN: 67 mg/dL — ABNORMAL HIGH (ref 6–20)
CO2: 29 mmol/L (ref 22–32)
Calcium: 8.5 mg/dL — ABNORMAL LOW (ref 8.9–10.3)
Chloride: 102 mmol/L (ref 98–111)
Creatinine, Ser: 1.83 mg/dL — ABNORMAL HIGH (ref 0.61–1.24)
GFR calc Af Amer: 46 mL/min — ABNORMAL LOW (ref >60)
GFR calc non Af Amer: 40 mL/min — ABNORMAL LOW (ref >60)
Glucose, Bld: 149 mg/dL — ABNORMAL HIGH (ref 70–99)
Phosphorus: 4.2 mg/dL (ref 2.5–4.6)
Potassium: 4.5 mmol/L (ref 3.5–5.1)
Sodium: 138 mmol/L (ref 135–145)

## 2019-08-27 LAB — COOXEMETRY PANEL
Carboxyhemoglobin: 2.7 % — ABNORMAL HIGH (ref 0.5–1.5)
Methemoglobin: 0 % (ref 0.0–1.5)
O2 Saturation: 61.9 %
Total hemoglobin: 8.5 g/dL — ABNORMAL LOW (ref 12.0–16.0)

## 2019-08-27 LAB — GLUCOSE, CAPILLARY
Glucose-Capillary: 128 mg/dL — ABNORMAL HIGH (ref 70–99)
Glucose-Capillary: 134 mg/dL — ABNORMAL HIGH (ref 70–99)
Glucose-Capillary: 137 mg/dL — ABNORMAL HIGH (ref 70–99)
Glucose-Capillary: 138 mg/dL — ABNORMAL HIGH (ref 70–99)
Glucose-Capillary: 147 mg/dL — ABNORMAL HIGH (ref 70–99)
Glucose-Capillary: 154 mg/dL — ABNORMAL HIGH (ref 70–99)
Glucose-Capillary: 161 mg/dL — ABNORMAL HIGH (ref 70–99)

## 2019-08-27 LAB — PROTIME-INR
INR: 3.5 — ABNORMAL HIGH (ref 0.8–1.2)
Prothrombin Time: 35.1 seconds — ABNORMAL HIGH (ref 11.4–15.2)

## 2019-08-27 LAB — MAGNESIUM: Magnesium: 2.6 mg/dL — ABNORMAL HIGH (ref 1.7–2.4)

## 2019-08-27 MED ORDER — SODIUM CHLORIDE 0.9 % IV SOLN
125.0000 mg | Freq: Every day | INTRAVENOUS | Status: AC
  Administered 2019-08-27 – 2019-09-01 (×6): 125 mg via INTRAVENOUS
  Filled 2019-08-27 (×6): qty 10

## 2019-08-27 MED ORDER — WARFARIN SODIUM 5 MG PO TABS
5.0000 mg | ORAL_TABLET | Freq: Once | ORAL | Status: AC
  Administered 2019-08-27: 5 mg
  Filled 2019-08-27: qty 1

## 2019-08-27 MED ORDER — MIDODRINE HCL 5 MG PO TABS
5.0000 mg | ORAL_TABLET | Freq: Three times a day (TID) | ORAL | Status: DC
  Administered 2019-08-27 – 2019-08-29 (×5): 5 mg via ORAL
  Filled 2019-08-27 (×8): qty 1

## 2019-08-27 MED ORDER — LIP MEDEX EX OINT
TOPICAL_OINTMENT | CUTANEOUS | Status: DC | PRN
  Filled 2019-08-27: qty 7

## 2019-08-27 MED ORDER — FUROSEMIDE 10 MG/ML IJ SOLN
120.0000 mg | Freq: Once | INTRAVENOUS | Status: AC
  Administered 2019-08-27: 120 mg via INTRAVENOUS
  Filled 2019-08-27: qty 12

## 2019-08-27 NOTE — Evaluation (Signed)
Occupational Therapy Evaluation Patient Details Name: Lance Cook MRN: [id number] DOB: [birth year] Today's Date: 08/27/2019    History of Present Illness 58 y.o. yo male with h/o CHF (EF 30%), mechanical mitral and aortic valve A. fib, s/p VT arrest, ICD, DM, Bell's palsy, CVA came to the ER 08/25/2019 with fatigue, weakness and hypotension; potassium of 8.4 associated with hypotension and AKI. IJ catheter placed for HD (went into rt carotid artery and required surgery for artery repair); 12/18 remained intubated after surgery, began CRRT; extubated 08/24/19   Clinical Impression   Pt admitted with above. He demonstrates the below listed deficits and will benefit from continued OT to maximize safety and independence with BADLs.  Pt presents to OT with generalized weakness/deconditioning, impaired balance, decreased activity tolerance, impaired cognition.  He currently requires total A, overall for all ADLs.  He lives with his wife - unsure level of assist she is able to provide at discharge.  He will require post acute rehab at discharge - likely SNF.  Will follow.       Follow Up Recommendations  SNF;Supervision/Assistance - 24 hour    Equipment Recommendations  None recommended by OT    Recommendations for Other Services       Precautions / Restrictions Precautions Precautions: Fall;Other (comment) Precaution Comments: multiple lines, CRRT via rt IJ catheter, IV, O2      Mobility Bed Mobility               General bed mobility comments: Pt moved into chair position in bed.  He was able to pull self forward into unsupported sitting x ~10 mins with close min guard assist.  unable to attempt to egress due to pt's short status and unable touch the floor   Transfers                      Balance Overall balance assessment: Needs assistance Sitting-balance support: No upper extremity supported Sitting balance-Leahy Scale: Fair Sitting balance - Comments: able to  maintain Unuspported sitting in bed egress position with close min gaurd assist                                    ADL either performed or assessed with clinical judgement   ADL Overall ADL's : Needs assistance/impaired Eating/Feeding: NPO;Sitting;Bed level   Grooming: Wash/dry face;Maximal assistance;Sitting Grooming Details (indicate cue type and reason): pt unable to perform thoroughly due to bil. UE weakness  Upper Body Bathing: Maximal assistance;Bed level;Sitting   Lower Body Bathing: Total assistance;Bed level   Upper Body Dressing : Total assistance;Bed level   Lower Body Dressing: Total assistance;Bed level   Toilet Transfer: Total assistance Toilet Transfer Details (indicate cue type and reason): unable to attempt safely  Toileting- Clothing Manipulation and Hygiene: Total assistance;Bed level               Vision         Perception     Praxis      Pertinent Vitals/Pain Pain Assessment: Faces Faces Pain Scale: Hurts even more Pain Location: buttocks  Pain Descriptors / Indicators: Grimacing;Guarding;Restless Pain Intervention(s): Monitored during session;Repositioned     Hand Dominance Right   Extremity/Trunk Assessment Upper Extremity Assessment Upper Extremity Assessment: Generalized weakness   Lower Extremity Assessment Lower Extremity Assessment: Defer to PT evaluation       Communication Communication Communication: Expressive difficulties(low volume )   Cognition  Arousal/Alertness: Awake/alert Behavior During Therapy: Flat affect;Anxious;Restless Overall Cognitive Status: Difficult to assess Area of Impairment: Attention;Following commands                   Current Attention Level: Sustained   Following Commands: Follows one step commands inconsistently;Follows one step commands with increased time       General Comments: Pt fixated on having tremors and having midodrine - difficult to redirect him.   conversation difficult to follow due to voice with low volume and pt non sensical at times    General Comments  VSs     Exercises Other Exercises Other Exercises: performed bil. UE shoulder flexion x 10, long arc quads 10 each LE and hip flexion x 5 each LE in seated position    Shoulder Instructions      Home Living Family/patient expects to be discharged to:: Private residence Living Arrangements: Spouse/significant other Available Help at Discharge: Family;Available 24 hours/day(wife is visually impaired ) Type of Home: Mobile home Home Access: Stairs to enter Entrance Stairs-Number of Steps: 2 Entrance Stairs-Rails: Right Home Layout: One level     Bathroom Shower/Tub: Occupational psychologist: Standard Bathroom Accessibility: Yes   Home Equipment: Environmental consultant - 2 wheels   Additional Comments: wife not present during OT eval - info gleaned from chart       Prior Functioning/Environment Level of Independence: Needs assistance  Gait / Transfers Assistance Needed: Uses RW or furniture walks. ADL's / Homemaking Assistance Needed: wife assists with LB bathing and dressing, dries pt after showering and does much of the IADL            OT Problem List: Decreased strength;Decreased activity tolerance;Impaired balance (sitting and/or standing);Decreased range of motion;Decreased cognition;Decreased knowledge of use of DME or AE;Decreased safety awareness;Cardiopulmonary status limiting activity;Pain      OT Treatment/Interventions: Self-care/ADL training;Therapeutic exercise;Energy conservation;DME and/or AE instruction;Therapeutic activities;Cognitive remediation/compensation;Patient/family education;Balance training    OT Goals(Current goals can be found in the care plan section) Acute Rehab OT Goals OT Goal Formulation: Patient unable to participate in goal setting Time For Goal Achievement: 09/10/19 Potential to Achieve Goals: Good ADL Goals Pt Will Perform  Grooming: with min assist;sitting Pt Will Perform Upper Body Bathing: with mod assist;sitting Pt Will Perform Lower Body Bathing: with max assist;sit to/from stand Pt Will Perform Upper Body Dressing: with mod assist;sitting Pt Will Perform Lower Body Dressing: with max assist;sit to/from stand Pt Will Transfer to Toilet: with mod assist;stand pivot transfer;bedside commode Additional ADL Goal #1: Pt will follow 1 step commands consistently during OT session  OT Frequency: Min 2X/week   Barriers to D/C: Decreased caregiver support  wife likely unable to provide necessary level of assist        Co-evaluation              AM-PAC OT "6 Clicks" Daily Activity     Outcome Measure Help from another person eating meals?: Total Help from another person taking care of personal grooming?: Total Help from another person toileting, which includes using toliet, bedpan, or urinal?: Total Help from another person bathing (including washing, rinsing, drying)?: Total Help from another person to put on and taking off regular upper body clothing?: Total Help from another person to put on and taking off regular lower body clothing?: Total 6 Click Score: 6   End of Session Nurse Communication: Mobility status  Activity Tolerance: Patient limited by pain;Patient limited by fatigue Patient left: in bed;with bed alarm set  OT Visit Diagnosis: Unsteadiness on feet (R26.81);Cognitive communication deficit (R41.841);Muscle weakness (generalized) (M62.81)                Time: [id number] OT Time Calculation (min): 28 min Charges:  OT General Charges $OT Visit: 1 Visit OT Evaluation $OT Eval Moderate Complexity: 1 Mod OT Treatments $Therapeutic Activity: 8-22 mins  Nilsa Nutting., OTR/L Acute Rehabilitation Services Pager [phone] Office [phone]   Lucille Passy M 08/27/2019, 3:28 PM

## 2019-08-27 NOTE — Plan of Care (Signed)
  Problem: Education: Goal: Knowledge of General Education information will improve Description: Including pain rating scale, medication(s)/side effects and non-pharmacologic comfort measures Outcome: Not Progressing Pt encouraged not to suction his mouth so completely dry as it will cause ulcers. Pt also encouraged not to suction his lips and he needs lubrication on his lips to keep them from drying out and becoming chapped. Pt continued to suction his lips even after RN explained why he shouldn't. As soon as moisturizer or lip balm is applied he immediately suctions it off with the yankuer.   Problem: Clinical Measurements: Goal: Respiratory complications will improve Outcome: Not Progressing  Pt has been encouraged to cough and deep breathe. Pt will cough spontaneously on his own, this RN has attempted to get pt to cough again and he says he cant. Pt does not seem to understand or comprehend the importance of coughing. RN explained he will get pneumonia if he cannot effectively cough, pt stated "I know that is what is happening." RN reminded him that he needs to cough to get the bacteria out of his lungs. Pt still does not seem to comprehend. Pts O2 demands increased to 5L during this shift.

## 2019-08-27 NOTE — Progress Notes (Signed)
[facility] Progress Note Patient Name: Lance Cook DOB: 03-15-1961 MRN: [id number]   Date of Service  08/27/2019  HPI/Events of Note  PT needs PRN lip balm order.  eICU Interventions  Order placed.        Kerry Kass Ogan 08/27/2019, 10:04 PM

## 2019-08-27 NOTE — Progress Notes (Signed)
Advanced Heart Failure Rounding Note   Subjective:    CVVHD stopped 12/24. Urine output picking up. CVP 7 -> 13-14. CR 1.4-> 1.8 -> 1.8   Off NE. SBP 130-140. Co-ox 62%    More alert but says he feels terrible. Still with mild ab pain. No SOB, orthopnea or PND    Objective:   Weight Range:  Vital Signs:   Temp:  [98.1 F (36.7 C)-98.8 F (37.1 C)] 98.8 F (37.1 C) (12/26 0700) Pulse Rate:  [66-96] 78 (12/26 1000) Resp:  [22-38] 29 (12/26 1000) BP: (80-142)/(47-118) 118/60 (12/26 1000) SpO2:  [80 %-100 %] 98 % (12/26 1000) Arterial Line BP: (71-177)/(39-87) 142/56 (12/26 1000) Weight:  [57 kg] 57 kg (12/26 0453) Last BM Date: 08/26/19  Weight change: Filed Weights   08/25/19 0500 08/26/19 0341 08/27/19 0453  Weight: 72.4 kg 65.4 kg 57 kg    Intake/Output:   Intake/Output Summary (Last 24 hours) at 08/27/2019 1011 Last data filed at 08/27/2019 0800 Gross per 24 hour  Intake 1529.26 ml  Output 845 ml  Net 684.26 ml     Physical Exam: General:  Appears chronically ill.  No resp difficulty HEENT: normal Neck: supple. RIJ wound healing. LIJ trialysis. Carotids 2+ bilat; no bruits. No lymphadenopathy or thryomegaly appreciated. General:  Well appearing. No resp difficulty Cor: PMI nondisplaced. Irregular rate & rhythm. Mech s1/s2 2/6 SEM LUSB Lungs: clear  Abdomen: soft, nontender, mildly distended. No hepatosplenomegaly. No bruits or masses. Good bowel sounds. Extremities: no cyanosis, clubbing, rash, 1-2+ edema Neuro: alert & orientedx3, cranial nerves grossly intact. moves all 4 extremities w/o difficulty. Affect pleasant    Telemetry: A fib 70-80s with PVCs Personally reviewed   Labs: Basic Metabolic Panel: Recent Labs  Lab 08/23/19 0213 08/24/19 0301 08/24/19 1640 08/25/19 0538 08/25/19 1542 08/26/19 0356 08/26/19 2021 08/26/19 2032 08/27/19 0459  NA 138 134*  135 135 137  136 135 135 136 134* 138  K 4.0 3.9  3.9 3.9 3.9  3.9 4.0 4.1  4.3 4.2 4.5  CL 104 102  103 102 102  101 102 100 100  --  102  CO2 24 24  25 23 26  26 25 25 26   --  29  GLUCOSE 107* 124*  122* 169* 124*  122* 113* 115* 152*  --  149*  BUN 14 22*  21* 22* 23*  24* 35* 48* 59*  --  67*  CREATININE 1.00 1.04  0.93 1.07 0.97  0.98 1.46* 1.82* 1.82*  --  1.83*  CALCIUM 8.2* 8.2*  8.3* 8.4* 8.5*  8.5* 8.0* 8.4* 8.6*  --  8.5*  MG 2.6* 2.3  --  2.4  --  2.5*  --   --  2.6*  PHOS 2.1* 2.3* 2.8 2.0*  1.9* 3.6 3.5  --   --  4.2    Liver Function Tests: Recent Labs  Lab 08/25/19 0538 08/25/19 1542 08/26/19 0356 08/26/19 2021 08/27/19 0459  AST  --   --   --  11*  --   ALT  --   --   --  18  --   ALKPHOS  --   --   --  66  --   BILITOT  --   --   --  0.2*  --   PROT  --   --   --  5.0*  --   ALBUMIN 2.2* 2.1* 2.0* 2.0* 1.9*   No results for input(s): LIPASE, AMYLASE in  the last 168 hours. Recent Labs  Lab 08/26/19 2021  AMMONIA 28    CBC: Recent Labs  Lab 08/23/19 0213 08/24/19 0301 08/25/19 0538 08/26/19 0356 08/26/19 2032 08/27/19 0459  WBC 11.7* 13.1* 12.2* 12.3*  --  12.4*  HGB 8.4* 8.5* 8.1* 7.9* 9.9* 8.2*  HCT 29.4* 30.1* 29.0* 27.9* 29.0* 30.0*  MCV 74.2* 74.3* 74.7* 73.4*  --  75.0*  PLT 142* 147* 126* 147*  --  151    Cardiac Enzymes: No results for input(s): CKTOTAL, CKMB, CKMBINDEX, TROPONINI in the last 168 hours.  BNP: BNP (last 3 results) Recent Labs    07/19/19 0956 08/02/19 0957 08/02/2019 1424  BNP 298.7* 167.3* 106.1*    ProBNP (last 3 results) No results for input(s): PROBNP in the last 8760 hours.    Other results:  Imaging: No results found.   Medications:     Scheduled Medications: . albuterol  2.5 mg Nebulization TID  . amiodarone  200 mg Per Tube BID  . chlorhexidine  15 mL Mouth Rinse BID  . Chlorhexidine Gluconate Cloth  6 each Topical Q0600  . darbepoetin (ARANESP) injection - NON-DIALYSIS  100 mcg Subcutaneous Q Wed-1800  . enoxaparin (LOVENOX) injection  1 mg/kg  Subcutaneous Q12H  . feeding supplement (PRO-STAT SUGAR FREE 64)  30 mL Per Tube BID  . hydrocortisone sodium succinate  50 mg Intravenous Q6H  . hydroxypropyl methylcellulose / hypromellose  1 drop Both Eyes TID  . insulin aspart  2-6 Units Subcutaneous Q6H  . mouth rinse  15 mL Mouth Rinse q12n4p  . midodrine  10 mg Per Tube TID  . pantoprazole sodium  40 mg Per Tube Daily  . Warfarin - Pharmacist Dosing Inpatient   Does not apply q1800    Infusions: . sodium chloride 10 mL/hr at 08/27/19 0800  . feeding supplement (JEVITY 1.5 CAL/FIBER) 1,000 mL (08/26/19 0942)  . ferric gluconate (FERRLECIT/NULECIT) IV 125 mg (08/27/19 0854)  . furosemide    . norepinephrine (LEVOPHED) Adult infusion Stopped (08/27/19 0448)    PRN Medications: sodium chloride, bisacodyl, heparin, polyethylene glycol, sennosides   Assessment/Plan:   1. AKI on CKD stage 3: Baseline creatinine 1.4-1.6 Creatinine up to 2.7 at admission with marked hyperkalemia and hyponatremia in setting of use of torsemide, metolazone, and spironolactone at home.  He had urgent HD with improvement.  - CVVHD stopped 12/24 Urine output picking up CVP 7->13 CR stable at 1.8. No uremia. Suspect he will recover renal function - D/w Renal. Will give one dose lasix 120IV today  2. Acute on chronic systolic CHF/cardiogenic shock: EF 30-35% on last echo.  [employer] CRT-D, LV not on due to diaphragmatic stimulation.  Hypotension in setting of AKI/hyperkalemia. N up to 8    - Volume overload improved with CVVHD. Weigh seems inaccurate today but well below baseline. Still with peripheral edema and elevated CVP. Will give one dose IV lasix today and follow - Now off NE. Will cut midodrine back to 5 tid - Will not restart HF meds yet 3. VT: Patient has Chemical [employer] CRT-D device, LV lead off due to diaphragmatic pacing.  Patient has has runs of VT, now on amiodarone gtt.  - VT quiescent - Continue amio at 200 po bid - Keep K > 4.0  and Mg > 2.0 (K 4.0 and MG 2.6 today) 4. Digoxin toxicity: Severely elevated digoxin level, has had digibind and HD.  - bradycardia improved 5. CAD: Last cath in 2016 with patent native coronaries,  probably occluded SVG-RCA.  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  - Continue heparin/warfarin. Hgb stable at 8.2. INR 3.5 Stop lovenox. Discussed dosing with PharmD personally. 7. Atrial fibrillation: Chronic.   - rate controlled 80-90s - Continue po amio  8. Acute hypoxic respiratory failure -Extubated on 2 liters [place]  9. Ab pain - unclear etiology. Improving. KUB 12/23 ok - CCM following 10. Debility, severe - PT/OT to see. Attempt to get OOB to chair   Length of Stay: 9   Glori Bickers MD 08/27/2019, 10:11 AM  Advanced Heart Failure Team Pager [phone] (M-F; [place])  Please contact [facility] for night-coverage after hours (4p -7a ) and weekends on [email or web address]

## 2019-08-27 NOTE — Progress Notes (Addendum)
NAME:  Lance Cook, MRN:  [id number], DOB:  Sep 25, 1960, LOS: 9 ADMISSION DATE:  08/30/2019, CONSULTATION DATE:  08/17/2019 REFERRING MD:  Dr Sherry Ruffing, CHIEF COMPLAINT:  Hyperkalemia    Brief History   58 yo male found to have severe life-threatening hyperkalemia, PCCM consulted for admission and need for urgent iHD.  History of present illness   58 year old man, history of Bentall procedure, MVR, atrial fibrillation, CAD with chronic systolic and diastolic CHF.  Came to the ER with fatigue, weakness and hypotension.  He was found to be profoundly bradycardic with severe left bundle branch block and QRS widening on EKG.  Found to be severely hyperkalemic with new acute renal failure.  He is on digoxin at baseline and his digoxin level was 2.6.  He will be admitted emergently to the ICU for hemodialysis. He was given digibind for dig toxicity.   Significant Hospital Events   12/17 Admitted, urgent HD 12/18 had HD catheter in carotid removed in OR. Returned intubated  Consults:  Nephrology Heart Failure  Procedures:  R IJ HD catheter 12/17 >> Misplaced in carotid, removed in OR 12/18  L IJ HD catheter 12/17 >>  ETI 12/18>>12/23  Significant Diagnostic Tests:  NM cardiac amyloid scan 10/21 >> equivocal for any evidence amyloidosis  ECHO 12/18 >  1. Left ventricular ejection fraction, by visual estimation, is 30 to 35%. The left ventricle has severely decreased function. Severely increased left ventricular size. There is no left ventricular hypertrophy.  2. Definity contrast agent was given IV to delineate the left ventricular endocardial borders.  3. Global hypokinesis, though function more preserved at base than apex. LV contrast excludes apical thrombus.  4. Global right ventricle has normal systolic function.The right ventricular size is normal. No increase in right ventricular wall thickness.  5. Left atrial size was normal.  6. Right atrial size was normal.  7. The mitral valve has  been repaired/replaced. Mild mitral valve regurgitation. Moderate mitral stenosis.  8. The tricuspid valve is normal in structure. Tricuspid valve regurgitation mild-moderate.  9. 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 10. The aortic valve is normal in structure. Aortic valve regurgitation is trivial by color flow Doppler. Structurally normal aortic valve, with no evidence of sclerosis or stenosis. 11. Aortic valve with trivial perivalvular leak. 12. The pulmonic valve was grossly normal. Pulmonic valve regurgitation is not visualized by color flow Doppler. 13. S/P Bentall. 14. Moderately elevated pulmonary artery systolic pressure. 15. A pacer wire is visualized in the RV and RA. 16. The inferior vena cava is dilated in size with <50% respiratory variability, suggesting right atrial pressure of 15 mmHg. 17. History of mechanical aortic and mitral valves. Leaflets not well visualized, could consider fluroscopy of leaflets, but gradients not suggestive of obstruction.  CXR 12/17 > New left internal jugular central line tip in the SVC above the right atrium. No pneumothorax. No other change.  Micro Data:  COVID 12/17 > negative  Blood cultures 12/17 >NGTD   MRSA PCR 12/17 >negative  Urine culture 12/18 >  Negative  Antimicrobials:  Cefazolin 12/18 SCIP   Interim history/subjective:  Extubated 12/23  Objective   Blood pressure 118/60, pulse 78, temperature 98.8 F (37.1 C), temperature source Oral, resp. rate (!) 29, height 5' 2.21" (1.58 m), weight 57 kg, SpO2 98 %. CVP:  [6 mmHg-20 mmHg] 14 mmHg      Intake/Output Summary (Last 24 hours) at 08/27/2019 1034 Last data filed at 08/27/2019 1000 Gross per 24 hour  Intake 1869.21 ml  Output 845 ml  Net 1024.21 ml   Filed Weights   08/25/19 0500 08/26/19 0341 08/27/19 0453  Weight: 72.4 kg 65.4 kg 57 kg    Examination: General: Awake alert in no acute distress HEENT: Right neck stable incision site, left IJ  hemodialysis catheter in place Neuro: Strange affect with follow commands moves all extremities CV: Heart sounds are distant, left pacemaker noted PULM: Decreased breath sounds at bases copious thick secretions GI: soft, bsx4 active : Extremities: warm/dry,  edema  Skin: no rashes or lesions    Resolved Hospital Problem list   Coagulopathy with elevated INR Hyperkalemia  Assessment & Plan:  Lance Cook is a 58 yo gentleman with significant cardiac history including HFrEF, s/p MVR, s/p Bentall procedure who presents with:  Acute hypoxemic respiratory failure -Extubated 12/23 -Difficulty in clearing secretions Plan:  Continue pulmonary toilet Bronchodilators and oxygen as needed  Right neck hematoma secondary to a right IJ hemodialysis catheter with arterial puncturPlan: Status post intervention per vascular surgery 08/20/2019 Continue to monitor wounds  Acute encephalopathy - In the setting of delirium, metabolic, digoxin toxicity, renal failure.  -He is alert to verbal and looking around however not following commands Plan: Minimize sedation Return precautions Mobilize as able  AKI on CKD 3 Acute renal failure requiring renal replacement therapy  Lab Results  Component Value Date   CREATININE 1.83 (H) 08/27/2019   CREATININE 1.82 (H) 08/26/2019   CREATININE 1.82 (H) 08/26/2019   CREATININE 0.94 01/16/2017   CREATININE 0.99 09/09/2016   CREATININE 0.85 07/14/2016    - Etiology of his renal failure likely includes use of diuretics, poor p.o. intake, shock.  Plan Nephrology is following Currently off CRRT  Cardiogenic Shock Acute on Chronic systolic and diastolic CHF (EF 99991111) CAD Mechanical mitral valve Chronic atrial fibrillation VT s/p AICD/pacer  -Admitted with severe left bundle branch block, wide-complex EKG, enlarged T waves, severe bradycardia in setting of Digoxin toxicity -Baseline anticoagulation with coumadin  -Per HF note his LV lead is not  currently on.  Plan: Off vasopressor support CVP is 14 Heparin drip was converted to Lovenox and warfarin Amiodarone is now p.o. Magnesium goal greater than 2 potassium goal greater than 4 Appreciate heart failure team input      Digoxin Toxicity - level on 12/18 was 9. Was s/p 2 units digibind, 4.1>3.2>3.2 Plan: No dig level since 08/22/2019 No further indications for Digibind   DM type 2 CBG (last 3)  Recent Labs    08/27/19 0254 08/27/19 0421 08/27/19 0754  GLUCAP 138* 128* 137*    Plan Scale insulin protocol  Anemia Recent Labs    08/26/19 2032 08/27/19 0459  HGB 9.9* 8.2*    Plan Transfuse per protocol  Deconditioning -chronic and critical illness Plan PT OT as tolerated  Best practice:  Diet: tube feeds per recs Pain/Anxiety/Delirium protocol (if indicated): na VAP protocol (if indicated): na DVT prophylaxis: warfarin + lovenox  GI prophylaxis: PPI Glucose control: SSI Mobility: PT/OT Code Status: Full Family Communication: 08/27/2019 patient updated at bedside Disposition: ICU  Labs   CBC: Recent Labs  Lab 08/23/19 0213 08/24/19 0301 08/25/19 0538 08/26/19 0356 08/26/19 2032 08/27/19 0459  WBC 11.7* 13.1* 12.2* 12.3*  --  12.4*  HGB 8.4* 8.5* 8.1* 7.9* 9.9* 8.2*  HCT 29.4* 30.1* 29.0* 27.9* 29.0* 30.0*  MCV 74.2* 74.3* 74.7* 73.4*  --  75.0*  PLT 142* 147* 126* 147*  --  [id number]    Basic Metabolic  Panel: Recent Labs  Lab 08/23/19 0213 08/24/19 0301 08/24/19 1640 08/25/19 0538 08/25/19 1542 08/26/19 0356 08/26/19 2021 08/26/19 2032 08/27/19 0459  NA 138 134*  135 135 137  136 135 135 136 134* 138  K 4.0 3.9  3.9 3.9 3.9  3.9 4.0 4.1 4.3 4.2 4.5  CL 104 102  103 102 102  101 102 100 100  --  102  CO2 24 24  25 23 26  26 25 25 26   --  29  GLUCOSE 107* 124*  122* 169* 124*  122* 113* 115* 152*  --  149*  BUN 14 22*  21* 22* 23*  24* 35* 48* 59*  --  67*  CREATININE 1.00 1.04  0.93 1.07 0.97  0.98 1.46* 1.82*  1.82*  --  1.83*  CALCIUM 8.2* 8.2*  8.3* 8.4* 8.5*  8.5* 8.0* 8.4* 8.6*  --  8.5*  MG 2.6* 2.3  --  2.4  --  2.5*  --   --  2.6*  PHOS 2.1* 2.3* 2.8 2.0*  1.9* 3.6 3.5  --   --  4.2   GFR: Estimated Creatinine Clearance: 34.3 mL/min (A) (by C-G formula based on SCr of 1.83 mg/dL (H)). Recent Labs  Lab 08/24/19 0301 08/25/19 0538 08/26/19 0356 08/27/19 0459  WBC 13.1* 12.2* 12.3* 12.4*    Liver Function Tests: Recent Labs  Lab 08/25/19 0538 08/25/19 1542 08/26/19 0356 08/26/19 2021 08/27/19 0459  AST  --   --   --  11*  --   ALT  --   --   --  18  --   ALKPHOS  --   --   --  66  --   BILITOT  --   --   --  0.2*  --   PROT  --   --   --  5.0*  --   ALBUMIN 2.2* 2.1* 2.0* 2.0* 1.9*    ABG    Component Value Date/Time   PHART 7.424 08/26/2019 2032   PCO2ART 44.7 08/26/2019 2032   PO2ART 96.0 08/26/2019 2032   HCO3 29.2 (H) 08/26/2019 2032   TCO2 31 08/26/2019 2032   ACIDBASEDEF 1.0 01/26/2015 0425   O2SAT 61.9 08/27/2019 0459     Coagulation Profile: Recent Labs  Lab 08/21/19 0436 08/21/19 0615 08/21/19 1152 08/26/19 0356 08/27/19 0459  INR 6.5* 5.7* 2.4* 1.5* 3.5*    CBG: Recent Labs  Lab 08/26/19 2026 08/27/19 0014 08/27/19 0254 08/27/19 0421 08/27/19 0754  GLUCAP 131* 161* 138* 128* 65*   APP CCT 30 MIN   Steve Minor ACNP Acute Care Nurse Practitioner [facility] Pulmonary/Critical Care Please consult Amion 08/27/2019, 10:35 AM  Attending Note:  59 year old male with CHF and failure to thrive in cardiogenic shock and acute on chronic renal failure that developed respiratory failure due to pulmonary edema that is now improved and extubated but hemodynamics remain a major issue.  Overnight, remains hypotensive with poor airway protection.  On exam, awake and interactive, stronger cough today compared to yesterday.  I reviewed CXR myself, pulmonary edema noted.  Discussed with PCCM-NP.  Will continue pressor support for now.  HD per renal.   Will consult palliative care on Monday.  Continue nutritional support.  PCCM will continue to manage.  The patient is critically ill with multiple organ systems failure and requires high complexity decision making for assessment and support, frequent evaluation and titration of therapies, application of advanced monitoring technologies and extensive interpretation  of multiple databases.   Critical Care Time devoted to patient care services described in this note is  32  Minutes. This time reflects time of care of this signee Dr Jennet Maduro. This critical care time does not reflect procedure time, or teaching time or supervisory time of PA/NP/Med student/Med Resident etc but could involve care discussion time.  Rush Farmer, M.D. [facility] Pulmonary/Critical Care Medicine.

## 2019-08-27 NOTE — Progress Notes (Signed)
[facility] NEPHROLOGY PROGRESS NOTE  Assessment/ Plan: Pt is a 58 y.o. yo male CHF, mechanical mitral and aortic valve A. fib, s/p VT arrest, DM, potassium of 8.4 associated with hypotension and AKI on admission.  #Severe hyperkalemia with EKG changes due to use of spironolactone, KCl and AKI: Received urgent dialysis on 12/17, started CRRT on 12/18 for fluid overload- stopped on 12/24.     #Acute on CKD stage III: Baseline creatinine around 1.3-1.6, likely ischemic ATN due to shock.  On CRRT 12/18 -- 12/24  with  UF.  CVP is now higher  with dep edema but likely third spacing.  CRRT stopped 12/24.  Making a lot more urine- crt stable but BUN up to the 60's-  No need for additional RRT today- cont to watch-  vascath in 8 days, cont to leave in  #Acute on chronic hyponatremia: Probably due to hypervolemia/diuretics.  He was on metolazone, torsemide and Aldactone at home.  Sodium level  improved.  #Acute on Chronic CHF, aortic and mitral valve replacement: Per cardiology.  S/p UF with CRRT.  Cont to hold CRRT and watch him-  Unclear if he needs diuretics - some CVP reading high but sodium is higher and he was only 600 positive yest but the option of diuretics is on the table  #Hypotension/shock: On Levophed.   Off and on pressors.    added midodrine   #Acute respiratory failure/VDRF: Intubated, per PCCM.  Extubated on 2 L  E[place]tes-  K and Phos OK  Anemia- needs replete iron-  Has allergy listed, will try test dose of ferrlecit -   and giving  ESA.  Did not seem to have had a reaction, will give more doses     Subjective: Extubated, on very low dose pressors overnight -  UOP 845 last 24 hours.  Reports of him being more lethargic but he was actually more alert for me today-  Figured out how to mute the TV  Objective Vital signs in last 24 hours: Vitals:   08/27/19 0545 08/27/19 0600 08/27/19 0615 08/27/19 0624  BP: (!) 115/55 (!) 104/56 (!) 114/56   Pulse: 80 91 86 86   Resp: (!) 36 (!) 29 (!) 38 (!) 33  Temp:      TempSrc:      SpO2: 96% 96% (!) 87% 97%  Weight:      Height:       Weight change: -8.4 kg  Intake/Output Summary (Last 24 hours) at 08/27/2019 0723 Last data filed at 08/27/2019 0600 Gross per 24 hour  Intake 1509.26 ml  Output 845 ml  Net 664.26 ml       Labs: Basic Metabolic Panel: Recent Labs  Lab 08/25/19 1542 08/26/19 0356 08/26/19 2021 08/26/19 2032 08/27/19 0459  NA 135 135 136 134* 138  K 4.0 4.1 4.3 4.2 4.5  CL 102 100 100  --  102  CO2 25 25 26   --  29  GLUCOSE 113* 115* 152*  --  149*  BUN 35* 48* 59*  --  67*  CREATININE 1.46* 1.82* 1.82*  --  1.83*  CALCIUM 8.0* 8.4* 8.6*  --  8.5*  PHOS 3.6 3.5  --   --  4.2   Liver Function Tests: Recent Labs  Lab 08/26/19 0356 08/26/19 2021 08/27/19 0459  AST  --  11*  --   ALT  --  18  --   ALKPHOS  --  66  --   BILITOT  --  0.2*  --   PROT  --  5.0*  --   ALBUMIN 2.0* 2.0* 1.9*   No results for input(s): LIPASE, AMYLASE in the last 168 hours. Recent Labs  Lab 08/26/19 2021  AMMONIA 28   CBC: Recent Labs  Lab 08/23/19 0213 08/24/19 0301 08/25/19 0538 08/26/19 0356 08/26/19 2032 08/27/19 0459  WBC 11.7* 13.1* 12.2* 12.3*  --  12.4*  HGB 8.4* 8.5* 8.1* 7.9* 9.9* 8.2*  HCT 29.4* 30.1* 29.0* 27.9* 29.0* 30.0*  MCV 74.2* 74.3* 74.7* 73.4*  --  75.0*  PLT 142* 147* 126* 147*  --  151   Cardiac Enzymes: No results for input(s): CKTOTAL, CKMB, CKMBINDEX, TROPONINI in the last 168 hours. CBG: Recent Labs  Lab 08/26/19 1546 08/26/19 2026 08/27/19 0014 08/27/19 0254 08/27/19 0421  GLUCAP 163* 131* 161* 138* 128*    Iron Studies:  Recent Labs    08/25/19 0538  IRON 9*  TIBC 262  FERRITIN 86   Studies/Results: No results found.  Medications: Infusions: . sodium chloride 10 mL/hr at 08/27/19 0600  . feeding supplement (JEVITY 1.5 CAL/FIBER) 1,000 mL (08/26/19 0942)  . norepinephrine (LEVOPHED) Adult infusion Stopped (08/27/19 0448)     Scheduled Medications: . albuterol  2.5 mg Nebulization TID  . amiodarone  200 mg Per Tube BID  . chlorhexidine  15 mL Mouth Rinse BID  . Chlorhexidine Gluconate Cloth  6 each Topical Q0600  . darbepoetin (ARANESP) injection - NON-DIALYSIS  100 mcg Subcutaneous Q Wed-1800  . enoxaparin (LOVENOX) injection  1 mg/kg Subcutaneous Q12H  . feeding supplement (PRO-STAT SUGAR FREE 64)  30 mL Per Tube BID  . hydrocortisone sodium succinate  50 mg Intravenous Q6H  . hydroxypropyl methylcellulose / hypromellose  1 drop Both Eyes TID  . insulin aspart  2-6 Units Subcutaneous Q6H  . mouth rinse  15 mL Mouth Rinse q12n4p  . midodrine  10 mg Per Tube TID  . pantoprazole sodium  40 mg Per Tube Daily  . Warfarin - Pharmacist Dosing Inpatient   Does not apply q1800    have reviewed scheduled and prn medications.  Physical Exam: General: more alert today, telling me a lot of things- not all relevant  Heart:RRR, s1s2 nl Lungs: Coarse breath sound bilateral Abdomen:soft,  non-distended Extremities: LE edema + pitting to dep areas Dialysis Access: Left IJ temporary catheter. In place for 8 days   Aime Carreras A Baraka Klatt 08/27/2019,7:23 AM  LOS: 9 days

## 2019-08-27 NOTE — Progress Notes (Signed)
ANTICOAGULATION CONSULT NOTE - Follow Up Consult  Pharmacy Consult for Heparin>>lovenox + warfarin Indication: Hx St Jude MVR/AVR + Afib  Patient Measurements: Height: 5' 2.21" (158 cm) Weight: 125 lb 10.6 oz (57 kg) IBW/kg (Calculated) : 55.07 Heparin Dosing Weight: 70 kg  Vital Signs: Temp: 98.1 F (36.7 C) (12/26 1100) Temp Source: Axillary (12/26 1100) BP: 120/71 (12/26 1100) Pulse Rate: 79 (12/26 1100)  Labs: Recent Labs    08/24/19 1640 08/24/19 1900 08/25/19 0203 08/25/19 0538 08/25/19 0930 08/26/19 0356 08/26/19 2021 08/26/19 2032 08/27/19 0459  HGB   < >  --   --  8.1*  --  7.9*  --  9.9* 8.2*  HCT   < >  --   --  29.0*  --  27.9*  --  29.0* 30.0*  PLT  --   --   --  126*  --  147*  --   --  151  LABPROT  --   --   --   --   --  18.3*  --   --  35.1*  INR  --   --   --   --   --  1.5*  --   --  3.5*  HEPARINUNFRC  --  0.22* 0.22*  --  0.25*  --   --   --   --   CREATININE  --   --   --  0.97  0.98  --  1.82* 1.82*  --  1.83*   < > = values in this interval not displayed.    Estimated Creatinine Clearance: 34.3 mL/min (A) (by C-G formula based on SCr of 1.83 mg/dL (H)).  Assessment: 58 yr old male on warfarin PTA for hx St Jude MVR + Afib - admitted with AKI and hyperkalemia requiring line replacement and start of CRRT. FFP given and heparin bridge started when INR<2.5. Pharmacy consulted to dose heparin.  PTA warfarin dose: 5mg  daily  INR at goal today 3.5  Cbc stable  Goal of Therapy:  INR 2.5-3.5 Monitor platelets by anticoagulation protocol: Yes   Plan:  Warfarin 5 mg x 1 (Watch INR closely as large jump likely d/t 10 mg dose 12/24) Daily INR  Barth Kirks, PharmD, BCPS, BCCCP Clinical Pharmacist [phone]  Please check AMION for all [place] numbers  08/27/2019 12:35 PM

## 2019-08-28 ENCOUNTER — Inpatient Hospital Stay (HOSPITAL_COMMUNITY): Payer: Medicare HMO

## 2019-08-28 LAB — RENAL FUNCTION PANEL
Albumin: 1.9 g/dL — ABNORMAL LOW (ref 3.5–5.0)
Anion gap: 8 (ref 5–15)
BUN: 82 mg/dL — ABNORMAL HIGH (ref 6–20)
CO2: 31 mmol/L (ref 22–32)
Calcium: 9.1 mg/dL (ref 8.9–10.3)
Chloride: 104 mmol/L (ref 98–111)
Creatinine, Ser: 1.93 mg/dL — ABNORMAL HIGH (ref 0.61–1.24)
GFR calc Af Amer: 43 mL/min — ABNORMAL LOW (ref >60)
GFR calc non Af Amer: 37 mL/min — ABNORMAL LOW (ref >60)
Glucose, Bld: 143 mg/dL — ABNORMAL HIGH (ref 70–99)
Phosphorus: 4 mg/dL (ref 2.5–4.6)
Potassium: 3.8 mmol/L (ref 3.5–5.1)
Sodium: 143 mmol/L (ref 135–145)

## 2019-08-28 LAB — GLUCOSE, CAPILLARY
Glucose-Capillary: 120 mg/dL — ABNORMAL HIGH (ref 70–99)
Glucose-Capillary: 126 mg/dL — ABNORMAL HIGH (ref 70–99)
Glucose-Capillary: 146 mg/dL — ABNORMAL HIGH (ref 70–99)
Glucose-Capillary: 152 mg/dL — ABNORMAL HIGH (ref 70–99)
Glucose-Capillary: 160 mg/dL — ABNORMAL HIGH (ref 70–99)
Glucose-Capillary: 91 mg/dL (ref 70–99)

## 2019-08-28 LAB — PROTIME-INR
INR: 5 (ref 0.8–1.2)
Prothrombin Time: 46.4 seconds — ABNORMAL HIGH (ref 11.4–15.2)

## 2019-08-28 LAB — CBC
HCT: 28.5 % — ABNORMAL LOW (ref 39.0–52.0)
Hemoglobin: 8 g/dL — ABNORMAL LOW (ref 13.0–17.0)
MCH: 20.9 pg — ABNORMAL LOW (ref 26.0–34.0)
MCHC: 28.1 g/dL — ABNORMAL LOW (ref 30.0–36.0)
MCV: 74.6 fL — ABNORMAL LOW (ref 80.0–100.0)
Platelets: 170 10*3/uL (ref 150–400)
RBC: 3.82 MIL/uL — ABNORMAL LOW (ref 4.22–5.81)
RDW: 21 % — ABNORMAL HIGH (ref 11.5–15.5)
WBC: 17 10*3/uL — ABNORMAL HIGH (ref 4.0–10.5)
nRBC: 1.1 % — ABNORMAL HIGH (ref 0.0–0.2)

## 2019-08-28 LAB — MAGNESIUM: Magnesium: 2.5 mg/dL — ABNORMAL HIGH (ref 1.7–2.4)

## 2019-08-28 LAB — COOXEMETRY PANEL
Carboxyhemoglobin: 1.7 % — ABNORMAL HIGH (ref 0.5–1.5)
Methemoglobin: 1 % (ref 0.0–1.5)
O2 Saturation: 57.8 %
Total hemoglobin: 8.2 g/dL — ABNORMAL LOW (ref 12.0–16.0)

## 2019-08-28 MED ORDER — PROCHLORPERAZINE EDISYLATE 10 MG/2ML IJ SOLN
5.0000 mg | Freq: Once | INTRAMUSCULAR | Status: AC
  Administered 2019-08-28: 5 mg via INTRAVENOUS
  Filled 2019-08-28: qty 1

## 2019-08-28 MED ORDER — ONDANSETRON HCL 4 MG/2ML IJ SOLN
4.0000 mg | Freq: Four times a day (QID) | INTRAMUSCULAR | Status: DC | PRN
  Administered 2019-08-28 – 2019-08-29 (×2): 4 mg via INTRAVENOUS
  Filled 2019-08-28 (×2): qty 2

## 2019-08-28 MED ORDER — ONDANSETRON HCL 4 MG/2ML IJ SOLN: 4.0000 mg | Freq: Four times a day (QID) | INTRAMUSCULAR | Status: DC

## 2019-08-28 NOTE — Progress Notes (Signed)
[place] Progress Note Patient Name: Lance Cook DOB: April 16, 1961 MRN: [id number]   Date of Service  08/28/2019  HPI/Events of Note  Nausea, zofran did not work  eICU Interventions  Compazine ordered      Intervention Category Minor Interventions: Other:  Margaretmary Lombard 08/28/2019, 8:52 PM

## 2019-08-28 NOTE — Progress Notes (Addendum)
NAME:  Lance Cook, MRN:  [place]:[id number], DOB:  11/23/1960, LOS: 39 ADMISSION DATE:  08/31/2019, CONSULTATION DATE:  08/04/2019 REFERRING MD:  Dr Sherry Ruffing, CHIEF COMPLAINT:  Hyperkalemia    Brief History   58 yo male found to have severe life-threatening hyperkalemia, PCCM consulted for admission and need for urgent iHD.  History of present illness   58 year old man, history of Bentall procedure, MVR, atrial fibrillation, CAD with chronic systolic and diastolic CHF.  Came to the ER with fatigue, weakness and hypotension.  He was found to be profoundly bradycardic with severe left bundle branch block and QRS widening on EKG.  Found to be severely hyperkalemic with new acute renal failure.  He is on digoxin at baseline and his digoxin level was 2.6.  He will be admitted emergently to the ICU for hemodialysis. He was given digibind for dig toxicity.   Significant Hospital Events   12/17 Admitted, urgent HD 12/18 had HD catheter in carotid removed in OR. Returned intubated  Consults:  Nephrology Heart Failure  Procedures:  R IJ HD catheter 12/17 >> Misplaced in carotid, removed in OR 12/18  L IJ HD catheter 12/17 >>  ETI 12/18>>12/23  Significant Diagnostic Tests:  NM cardiac amyloid scan 10/21 >> equivocal for any evidence amyloidosis  ECHO 12/18 >  1. Left ventricular ejection fraction, by visual estimation, is 30 to 35%. The left ventricle has severely decreased function. Severely increased left ventricular size. There is no left ventricular hypertrophy.  2. Definity contrast agent was given IV to delineate the left ventricular endocardial borders.  3. Global hypokinesis, though function more preserved at base than apex. LV contrast excludes apical thrombus.  4. Global right ventricle has normal systolic function.The right ventricular size is normal. No increase in right ventricular wall thickness.  5. Left atrial size was normal.  6. Right atrial size was normal.  7. The mitral valve  has been repaired/replaced. Mild mitral valve regurgitation. Moderate mitral stenosis.  8. The tricuspid valve is normal in structure. Tricuspid valve regurgitation mild-moderate.  9. 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 10. The aortic valve is normal in structure. Aortic valve regurgitation is trivial by color flow Doppler. Structurally normal aortic valve, with no evidence of sclerosis or stenosis. 11. Aortic valve with trivial perivalvular leak. 12. The pulmonic valve was grossly normal. Pulmonic valve regurgitation is not visualized by color flow Doppler. 13. S/P Bentall. 14. Moderately elevated pulmonary artery systolic pressure. 15. A pacer wire is visualized in the RV and RA. 16. The inferior vena cava is dilated in size with <50% respiratory variability, suggesting right atrial pressure of 15 mmHg. 17. History of mechanical aortic and mitral valves. Leaflets not well visualized, could consider fluroscopy of leaflets, but gradients not suggestive of obstruction.  CXR 12/17 > New left internal jugular central line tip in the SVC above the right atrium. No pneumothorax. No other change.  Micro Data:  COVID 12/17 > negative  Blood cultures 12/17 >NGTD   MRSA PCR 12/17 >negative  Urine culture 12/18 >  Negative  Antimicrobials:  Cefazolin 12/18 SCIP   Interim history/subjective:  No acute events overnight, remains very weak with ineffective cough   Objective   Blood pressure 116/83, pulse 91, temperature 98.3 F (36.8 C), temperature source Oral, resp. rate (!) 32, height 5' 2.21" (1.58 m), weight 57 kg, SpO2 99 %. CVP:  [0 mmHg-25 mmHg] 9 mmHg      Intake/Output Summary (Last 24 hours) at 08/28/2019 [id number] Last data  filed at 08/28/2019 0600 Gross per 24 hour  Intake 1521.82 ml  Output 2120 ml  Net -598.18 ml   Filed Weights   08/25/19 0500 08/26/19 0341 08/27/19 0453  Weight: 72.4 kg 65.4 kg 57 kg   Examination: General: Chronically ill appearing elderly  very deconditioned male, in NAD HEENT: [place]/AT, NG tube, MM pink/moist, PERRL,  Neuro: Alert and oriented, able to follow all commands, very weak and deconditioned  CV: s1s2 regular rate and rhythm, no murmur, rubs, or gallops,  PULM:  Course breath sounds bilaterally with weak ineffective cough, Supplemental oxygen via [place], tachypnea  GI: soft, bowel sounds active in all 4 quadrants, non-tender, non-distended, tolerating TF Extremities: warm/dry, 1+ pitting lower extremity edema  Skin: no rashes or lesions  Resolved Hospital Problem list   Coagulopathy with elevated INR Hyperkalemia  Assessment & Plan:  [name] is a 58 yo gentleman with significant cardiac history including HFrEF, s/p MVR, s/p Bentall procedure who presents with:  Acute hypoxemic respiratory failure -Extubated 12/23 -Difficulty in clearing secretions Plan: Encourage frequent pulmonary hygiene  High risk for inability to protect airway given ineffective weak cough  Bronchodilators and oxygen as needed  Titrate supplemental oxygen for sats of 88-92%  Right neck hematoma secondary to a right IJ hemodialysis catheter with arterial punctur -Status post intervention per vascular surgery 08/20/2019 Plan: Continue to monitor wound   Acute encephalopathy - In the setting of delirium, metabolic, digoxin toxicity, renal failure.  -He is alert to verbal and looking around however not following commands Plan: Minimize sedation Fall precautions Mobilize as able  Delirium precautions   AKI on CKD 3 Acute renal failure requiring renal replacement therapy  - Etiology of his renal failure likely includes use of diuretics, poor p.o. intake, shock.  Plan Nephrology following  CRRT 12/18-12/24 Diurese per Heart Failure team    Cardiogenic Shock Acute on Chronic systolic and diastolic CHF (EF [id number]) CAD Mechanical mitral valve Chronic atrial fibrillation VT s/p AICD/pacer  -Admitted with severe left bundle branch  block, wide-complex EKG, enlarged T waves, severe bradycardia in setting of Digoxin toxicity -Baseline anticoagulation with coumadin  -Per HF note his LV lead is not currently on.  Plan: Hold further diuretics today per heart failure team  Continue PO amiodarone  Mg goal 2 K goal 4 Appreciate heart failure assistance   Digoxin Toxicity - level on 12/18 was 9. Was s/p 2 units digibind, 4.1>3.2>3.2 Plan: No dig level since 08/22/2019 No further indications for Digibind   DM type 2 Plan SSI  Anemia Plan Transfuse per protocol  Trend   Deconditioning -chronic and critical illness Plan PT/OT evals   Best practice:  Diet: tube feeds per recs Pain/Anxiety/Delirium protocol (if indicated): na VAP protocol (if indicated): na DVT prophylaxis: warfarin + lovenox  GI prophylaxis: PPI Glucose control: SSI Mobility: PT/OT Code Status: Full Family Communication: 08/27/2019 patient updated at bedside Disposition: ICU  Labs   CBC: Recent Labs  Lab 08/24/19 0301 08/25/19 0538 08/26/19 0356 08/26/19 2032 08/27/19 0459 08/28/19 0505  WBC 13.1* 12.2* 12.3*  --  12.4* 17.0*  HGB 8.5* 8.1* 7.9* 9.9* 8.2* 8.0*  HCT 30.1* 29.0* 27.9* 29.0* 30.0* 28.5*  MCV 74.3* 74.7* 73.4*  --  75.0* 74.6*  PLT 147* 126* 147*  --  151 [id number]    Basic Metabolic Panel: Recent Labs  Lab 08/24/19 0301 08/25/19 0538 08/25/19 1542 08/26/19 0356 08/26/19 2021 08/26/19 2032 08/27/19 0459 08/28/19 0505  NA 134*  135 137  136  135 135 136 134* 138 143  K 3.9  3.9 3.9  3.9 4.0 4.1 4.3 4.2 4.5 3.8  CL 102  103 102  101 102 100 100  --  102 104  CO2 24  25 26  26 25 25 26   --  29 31  GLUCOSE 124*  122* 124*  122* 113* 115* 152*  --  149* 143*  BUN 22*  21* 23*  24* 35* 48* 59*  --  67* 82*  CREATININE 1.04  0.93 0.97  0.98 1.46* 1.82* 1.82*  --  1.83* 1.93*  CALCIUM 8.2*  8.3* 8.5*  8.5* 8.0* 8.4* 8.6*  --  8.5* 9.1  MG 2.3 2.4  --  2.5*  --   --  2.6* 2.5*  PHOS 2.3* 2.0*  1.9*  3.6 3.5  --   --  4.2 4.0   GFR: Estimated Creatinine Clearance: 32.5 mL/min (A) (by C-G formula based on SCr of 1.93 mg/dL (H)). Recent Labs  Lab 08/25/19 0538 08/26/19 0356 08/27/19 0459 08/28/19 0505  WBC 12.2* 12.3* 12.4* 17.0*    Liver Function Tests: Recent Labs  Lab 08/25/19 1542 08/26/19 0356 08/26/19 2021 08/27/19 0459 08/28/19 0505  AST  --   --  11*  --   --   ALT  --   --  18  --   --   ALKPHOS  --   --  66  --   --   BILITOT  --   --  0.2*  --   --   PROT  --   --  5.0*  --   --   ALBUMIN 2.1* 2.0* 2.0* 1.9* 1.9*    ABG    Component Value Date/Time   PHART 7.424 08/26/2019 2032   PCO2ART 44.7 08/26/2019 2032   PO2ART 96.0 08/26/2019 2032   HCO3 29.2 (H) 08/26/2019 2032   TCO2 31 08/26/2019 2032   ACIDBASEDEF 1.0 01/26/2015 0425   O2SAT 57.8 08/28/2019 0505     Coagulation Profile: Recent Labs  Lab 08/21/19 1152 08/26/19 0356 08/27/19 0459 08/28/19 0505  INR 2.4* 1.5* 3.5* 5.0*    CBG: Recent Labs  Lab 08/27/19 1151 08/27/19 1600 08/27/19 2001 08/28/19 0003 08/28/19 0151  GLUCAP 134* 154* 147* 72* 66*   [name], NP-C [facility] Contact / Pager information can be found on Amion  08/29/2019, 12:47 PM

## 2019-08-28 NOTE — Progress Notes (Signed)
Advanced Heart Failure Rounding Note   Subjective:    CVVHD stopped 12/24.  Got lasix 120 IV x 1 yesterday. Out 2.1L Creatinine stable 1.8-1.9. Renal pulling Trialysis cath today  Off NE. SBP 130-140. Co-ox 58%. CVP 8-9  Very weak. Belly still sore. INR 5.0 No bleeding   Objective:   Weight Range:  Vital Signs:   Temp:  [98.1 F (36.7 C)-99.2 F (37.3 C)] 98.3 F (36.8 C) (12/27 0439) Pulse Rate:  [79-105] 92 (12/27 0800) Resp:  [17-35] 23 (12/27 0900) BP: (94-177)/(21-117) 134/82 (12/27 0900) SpO2:  [80 %-100 %] 90 % (12/27 0837) Arterial Line BP: (129-147)/(48-54) 129/48 (12/26 1200) Last BM Date: 08/28/19  Weight change: Filed Weights   08/25/19 0500 08/26/19 0341 08/27/19 0453  Weight: 72.4 kg 65.4 kg 57 kg    Intake/Output:   Intake/Output Summary (Last 24 hours) at 08/28/2019 1014 Last data filed at 08/28/2019 1000 Gross per 24 hour  Intake 1657.85 ml  Output 2120 ml  Net -462.15 ml     Physical Exam: General:  Chronically-ill appearing HEENT: normal Neck: supple. R neck wound. LIJ trialysis Carotids 2+ bilat; no bruits. No lymphadenopathy or thryomegaly appreciated. Cor: PMI nondisplaced. Irregular rate & rhythm. Mech s1/s2 2/6 SEM at apex Lungs: clear with weak effort Abdomen: soft, nontender, mildly distended. No hepatosplenomegaly. No bruits or masses. Good bowel sounds. Extremities: no cyanosis, clubbing, rash, 1+ edema Neuro: alert & orientedx3, cranial nerves grossly intact. moves all 4 extremities w/o difficulty. Affect flat     Telemetry: A fib 80-90s with PVCs Personally reviewed   Labs: Basic Metabolic Panel: Recent Labs  Lab 08/24/19 0301 08/25/19 0538 08/25/19 1542 08/26/19 0356 08/26/19 2021 08/26/19 2032 08/27/19 0459 08/28/19 0505  NA 134*  135 137  136 135 135 136 134* 138 143  K 3.9  3.9 3.9  3.9 4.0 4.1 4.3 4.2 4.5 3.8  CL 102  103 102  101 102 100 100  --  102 104  CO2 24  25 26  26 25 25 26   --  29 31    GLUCOSE 124*  122* 124*  122* 113* 115* 152*  --  149* 143*  BUN 22*  21* 23*  24* 35* 48* 59*  --  67* 82*  CREATININE 1.04  0.93 0.97  0.98 1.46* 1.82* 1.82*  --  1.83* 1.93*  CALCIUM 8.2*  8.3* 8.5*  8.5* 8.0* 8.4* 8.6*  --  8.5* 9.1  MG 2.3 2.4  --  2.5*  --   --  2.6* 2.5*  PHOS 2.3* 2.0*  1.9* 3.6 3.5  --   --  4.2 4.0    Liver Function Tests: Recent Labs  Lab 08/25/19 1542 08/26/19 0356 08/26/19 2021 08/27/19 0459 08/28/19 0505  AST  --   --  11*  --   --   ALT  --   --  18  --   --   ALKPHOS  --   --  66  --   --   BILITOT  --   --  0.2*  --   --   PROT  --   --  5.0*  --   --   ALBUMIN 2.1* 2.0* 2.0* 1.9* 1.9*   No results for input(s): LIPASE, AMYLASE in the last 168 hours. Recent Labs  Lab 08/26/19 2021  AMMONIA 28    CBC: Recent Labs  Lab 08/24/19 0301 08/25/19 [id number] 08/26/19 0356 08/26/19 2032 08/27/19 0459 08/28/19 0505  WBC 13.1* 12.2* 12.3*  --  12.4* 17.0*  HGB 8.5* 8.1* 7.9* 9.9* 8.2* 8.0*  HCT 30.1* 29.0* 27.9* 29.0* 30.0* 28.5*  MCV 74.3* 74.7* 73.4*  --  75.0* 74.6*  PLT 147* 126* 147*  --  151 170    Cardiac Enzymes: No results for input(s): CKTOTAL, CKMB, CKMBINDEX, TROPONINI in the last 168 hours.  BNP: BNP (last 3 results) Recent Labs    07/19/19 0956 08/02/19 0957 08/26/2019 1424  BNP 298.7* 167.3* 106.1*    ProBNP (last 3 results) No results for input(s): PROBNP in the last 8760 hours.    Other results:  Imaging: AM DG Chest Port 1 View  Result Date: 08/28/2019 CLINICAL DATA:  58 year old male s/p cardiogenic shock. Abnormal respiration. Negative for COVID-19. EXAM: PORTABLE CHEST 1 VIEW COMPARISON:  08/20/2019 and earlier. FINDINGS: Portable AP upright view at 0537 hours. Extubated. An enteric tube is in place, with side hole at the level of the stomach. Stable lung volumes compared to 08/20/2019. Stable cardiomegaly and mediastinal contours. Prosthetic cardiac valve and left chest AICD again noted. Patchy and  nodular right lung opacity has mildly regressed, with a more reticulonodular appearance now. Left lung perihilar ventilation also appears improved. No pneumothorax. No pleural effusion is evident. Negative visible bowel gas pattern. IMPRESSION: 1. Extubated. 2. Enteric tube in place, side hole at the level of the stomach. 3. Improved ventilation since 08/20/2019. Residual reticulonodular opacity in the right lung might be asymmetric edema or infection. Electronically Signed   By: Genevie Ann M.D.   On: 08/28/2019 07:26     Medications:     Scheduled Medications: . albuterol  2.5 mg Nebulization TID  . amiodarone  200 mg Per Tube BID  . chlorhexidine  15 mL Mouth Rinse BID  . Chlorhexidine Gluconate Cloth  6 each Topical Q0600  . darbepoetin (ARANESP) injection - NON-DIALYSIS  100 mcg Subcutaneous Q Wed-1800  . feeding supplement (PRO-STAT SUGAR FREE 64)  30 mL Per Tube BID  . hydrocortisone sodium succinate  50 mg Intravenous Q6H  . hydroxypropyl methylcellulose / hypromellose  1 drop Both Eyes TID  . insulin aspart  2-6 Units Subcutaneous Q6H  . mouth rinse  15 mL Mouth Rinse q12n4p  . midodrine  5 mg Oral TID WC  . pantoprazole sodium  40 mg Per Tube Daily  . Warfarin - Pharmacist Dosing Inpatient   Does not apply q1800    Infusions: . sodium chloride Stopped (08/28/19 0931)  . feeding supplement (JEVITY 1.5 CAL/FIBER) 1,000 mL (08/27/19 1253)  . ferric gluconate (FERRLECIT/NULECIT) IV 110 mL/hr at 08/28/19 1000  . norepinephrine (LEVOPHED) Adult infusion Stopped (08/27/19 0448)    PRN Medications: sodium chloride, bisacodyl, heparin, lip balm, polyethylene glycol, sennosides   Assessment/Plan:   1. AKI on CKD stage 3: Baseline creatinine 1.4-1.6 Creatinine up to 2.7 at admission with marked hyperkalemia and hyponatremia in setting of use of torsemide, metolazone, and spironolactone at home.  He had urgent HD with improvement.  - CVVHD stopped 12/24 Urine output improved with IV  lasix x 1 yesterday Creatinine now stable 1.8-1.9. BUN up. CVP 8-9 - Renal pulling trialysis cath today.  - Will not give lasix 2. Acute on chronic systolic CHF/cardiogenic shock: EF 30-35% on last echo.  [employer] CRT-D, LV not on due to diaphragmatic stimulation.  Hypotension in setting of AKI/hyperkalemia. - Volume overload much improved with CVVHD.  - Still with some peripheral edema but weight far below baseline. Will not diurese further  today - Off NE. Will cut midodrine back to 2.5 tid. Hop59fully can stop tomorrow - Will not restart HF meds yet 3. VT: Patient has [employer] CRT-D device, LV lead off due to diaphragmatic pacing.  Patient has has runs of VT, now on amiodarone gtt.  - VT quiescent - Continue amio at 200 po bid - Keep K > 4.0 and Mg > 2.0 (K 4.0 and MG 2.6 today) 4. Digoxin toxicity: Severely elevated digoxin level, has had digibind and HD.  - bradycardia improved 5. CAD: Last cath in 2016 with patent native coronaries, probably occluded SVG-RCA.  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  - Continue heparin/warfarin. Hgb stable at 8.2 -> 8.0. INR 5.0 Stop lovenox. PharmD managing 7. Atrial fibrillation: Chronic.   - rate controlled 80-90s - Continue po amio  8. Acute hypoxic respiratory failure -Extubated on 2 liters [place]  9. Ab pain - improving. KUB ok 10. Debility, severe - PT/OT to see. Attempt to get OOB to chair   Length of Stay: 10   Glori Bickers MD 08/28/2019, 10:14 AM  Advanced Heart Failure Team Pager [phone] (M-F; [place])  Please contact [facility] for night-coverage after hours (4p -7a ) and weekends on [email or web address]

## 2019-08-28 NOTE — Progress Notes (Signed)
CRITICAL VALUE ALERT  Critical Value:  INR 5.0  Date & Time Notified:  08/28/19 6:46 AM   Provider Notified: Dellia Nims, RN  Clint Bolder, RN 08/28/19 6:46 AM

## 2019-08-28 NOTE — Progress Notes (Signed)
[facility] NEPHROLOGY PROGRESS NOTE  Assessment/ Plan: Pt is a 58 y.o. yo male CHF, mechanical mitral and aortic valve A. fib, s/p VT arrest, DM, potassium of 8.4 associated with hypotension and AKI on admission.  #Severe hyperkalemia with EKG changes due to use of spironolactone, KCl and AKI: Received urgent dialysis on 12/17, started CRRT on 12/18 for fluid overload- stopped on 12/24.  Resolved.   #Acute on CKD stage III: Baseline creatinine around 1.3-1.6, likely ischemic ATN due to shock.  On CRRT 12/18 -- 12/24  with  UF.  CVP labile 2.1L UOP yesterday, much increased. Stable SCr 1.9 near baseline.  Doubt needs further HD and given HD cath present since admit, will remove today.    #Acute on chronic hyponatremia: Sodium level  Improved. From hypervolemia and cardiorenal syndrome.   #Acute on Chronic CHF, aortic and mitral valve replacement: Per cardiology.  S/p UF with CRRT.  Improving UOP. Diuretics per AHF  #Hypotension/shock: Not on pressors currenlty. Midodrine  + hydrocortisone  #Acute respiratory failure/VDRF: Extubated on 2-4L   Anemia- needs replete iron-  Has allergy listed, Tolerated test dose, getting Fe.    Subjective:  No issues this AM. No pressors. 2.1L UOP.  Stable SCr. K and HCO3 ok.  Has Temp HD cath > 7d.  Off NE.   Objective Vital signs in last 24 hours: Vitals:   08/28/19 0439 08/28/19 0500 08/28/19 0600 08/28/19 0837  BP:  123/83 116/83   Pulse:  87 91   Resp:  (!) 24 (!) 32   Temp: 98.3 F (36.8 C)     TempSrc: Oral     SpO2:  100% 99% 90%  Weight:      Height:       Weight change:   Intake/Output Summary (Last 24 hours) at 08/28/2019 [id number] Last data filed at 08/28/2019 0600 Gross per 24 hour  Intake 1448.57 ml  Output 2120 ml  Net -671.43 ml       Labs: Basic Metabolic Panel: Recent Labs  Lab 08/26/19 0356 08/26/19 2021 08/26/19 2032 08/27/19 0459 08/28/19 0505  NA 135 136 134* 138 143  K 4.1 4.3 4.2 4.5 3.8   CL 100 100  --  102 104  CO2 25 26  --  29 31  GLUCOSE 115* 152*  --  149* 143*  BUN 48* 59*  --  67* 82*  CREATININE 1.82* 1.82*  --  1.83* 1.93*  CALCIUM 8.4* 8.6*  --  8.5* 9.1  PHOS 3.5  --   --  4.2 4.0   Liver Function Tests: Recent Labs  Lab 08/26/19 2021 08/27/19 0459 08/28/19 0505  AST 11*  --   --   ALT 18  --   --   ALKPHOS 66  --   --   BILITOT 0.2*  --   --   PROT 5.0*  --   --   ALBUMIN 2.0* 1.9* 1.9*   No results for input(s): LIPASE, AMYLASE in the last 168 hours. Recent Labs  Lab 08/26/19 2021  AMMONIA 28   CBC: Recent Labs  Lab 08/24/19 0301 08/25/19 0538 08/26/19 0356 08/26/19 2032 08/27/19 0459 08/28/19 0505  WBC 13.1* 12.2* 12.3*  --  12.4* 17.0*  HGB 8.5* 8.1* 7.9* 9.9* 8.2* 8.0*  HCT 30.1* 29.0* 27.9* 29.0* 30.0* 28.5*  MCV 74.3* 74.7* 73.4*  --  75.0* 74.6*  PLT 147* 126* 147*  --  151 170   Cardiac Enzymes: No results for input(s): CKTOTAL,  CKMB, CKMBINDEX, TROPONINI in the last 168 hours. CBG: Recent Labs  Lab 08/27/19 1600 08/27/19 2001 08/28/19 0003 08/28/19 0151 08/28/19 0825  GLUCAP 154* 147* 152* 160* 146*    Iron Studies:  No results for input(s): IRON, TIBC, TRANSFERRIN, FERRITIN in the last 72 hours. Studies/Results: AM DG Chest Port 1 View  Result Date: 08/28/2019 CLINICAL DATA:  58 year old male s/p cardiogenic shock. Abnormal respiration. Negative for COVID-19. EXAM: PORTABLE CHEST 1 VIEW COMPARISON:  08/20/2019 and earlier. FINDINGS: Portable AP upright view at 0537 hours. Extubated. An enteric tube is in place, with side hole at the level of the stomach. Stable lung volumes compared to 08/20/2019. Stable cardiomegaly and mediastinal contours. Prosthetic cardiac valve and left chest AICD again noted. Patchy and nodular right lung opacity has mildly regressed, with a more reticulonodular appearance now. Left lung perihilar ventilation also appears improved. No pneumothorax. No pleural effusion is evident. Negative  visible bowel gas pattern. IMPRESSION: 1. Extubated. 2. Enteric tube in place, side hole at the level of the stomach. 3. Improved ventilation since 08/20/2019. Residual reticulonodular opacity in the right lung might be asymmetric edema or infection. Electronically Signed   By: Genevie Ann M.D.   On: 08/28/2019 07:26    Medications: Infusions: . sodium chloride 10 mL/hr at 08/28/19 0000  . feeding supplement (JEVITY 1.5 CAL/FIBER) 1,000 mL (08/27/19 1253)  . ferric gluconate (FERRLECIT/NULECIT) IV Stopped (08/27/19 0954)  . norepinephrine (LEVOPHED) Adult infusion Stopped (08/27/19 0448)    Scheduled Medications: . albuterol  2.5 mg Nebulization TID  . amiodarone  200 mg Per Tube BID  . chlorhexidine  15 mL Mouth Rinse BID  . Chlorhexidine Gluconate Cloth  6 each Topical Q0600  . darbepoetin (ARANESP) injection - NON-DIALYSIS  100 mcg Subcutaneous Q Wed-1800  . feeding supplement (PRO-STAT SUGAR FREE 64)  30 mL Per Tube BID  . hydrocortisone sodium succinate  50 mg Intravenous Q6H  . hydroxypropyl methylcellulose / hypromellose  1 drop Both Eyes TID  . insulin aspart  2-6 Units Subcutaneous Q6H  . mouth rinse  15 mL Mouth Rinse q12n4p  . midodrine  5 mg Oral TID WC  . pantoprazole sodium  40 mg Per Tube Daily  . Warfarin - Pharmacist Dosing Inpatient   Does not apply q1800    have reviewed scheduled and prn medications.  Physical Exam: General: awake, alert Heart:RRR, s1s2 nl Lungs: Coarse breath sound bilateral Abdomen:soft,  non-distended Extremities: LE edema + pitting to dep areas Dialysis Access: Left IJ temporary catheter. Present since admission   Gracin Mcpartland B Petrea Fredenburg 08/28/2019,9:22 AM  LOS: 10 days

## 2019-08-28 NOTE — Progress Notes (Signed)
ANTICOAGULATION CONSULT NOTE - Follow Up Consult  Pharmacy Consult for Heparin>>lovenox + warfarin Indication: Hx St Jude MVR/AVR + Afib  Patient Measurements: Height: 5' 2.21" (158 cm) Weight: 125 lb 10.6 oz (57 kg) IBW/kg (Calculated) : 55.07 Heparin Dosing Weight: 70 kg  Vital Signs: Temp: 98.3 F (36.8 C) (12/27 0439) Temp Source: Oral (12/27 0439) BP: 137/117 (12/27 1000) Pulse Rate: 88 (12/27 1000)  Labs: Recent Labs    08/26/19 0356 08/26/19 2021 08/26/19 2032 08/27/19 0459 08/28/19 0505  HGB 7.9*  --  9.9* 8.2* 8.0*  HCT 27.9*  --  29.0* 30.0* 28.5*  PLT 147*  --   --  151 170  LABPROT 18.3*  --   --  35.1* 46.4*  INR 1.5*  --   --  3.5* 5.0*  CREATININE 1.82* 1.82*  --  1.83* 1.93*    Estimated Creatinine Clearance: 32.5 mL/min (A) (by C-G formula based on SCr of 1.93 mg/dL (H)).  Assessment: 58 yr old male on warfarin PTA for hx St Jude MVR + Afib - admitted with AKI and hyperkalemia requiring line replacement and start of CRRT. FFP given and heparin bridge started when INR<2.5. Pharmacy consulted to dose heparin.  PTA warfarin dose: 5mg  daily  INR today high at 5  Cbc stable  Goal of Therapy:  INR 2.5-3.5 Monitor platelets by anticoagulation protocol: Yes   Plan:  Hold warfarin tonight Daily INR  Barth Kirks, PharmD, BCPS, BCCCP Clinical Pharmacist [phone]  Please check AMION for all [place] numbers  08/28/2019 10:58 AM

## 2019-08-29 ENCOUNTER — Telehealth: Payer: Self-pay | Admitting: Internal Medicine

## 2019-08-29 ENCOUNTER — Inpatient Hospital Stay (HOSPITAL_COMMUNITY): Payer: Medicare HMO

## 2019-08-29 DIAGNOSIS — I5043 Acute on chronic combined systolic (congestive) and diastolic (congestive) heart failure: Secondary | ICD-10-CM

## 2019-08-29 LAB — RENAL FUNCTION PANEL
Albumin: 2.2 g/dL — ABNORMAL LOW (ref 3.5–5.0)
Anion gap: 11 (ref 5–15)
BUN: 94 mg/dL — ABNORMAL HIGH (ref 6–20)
CO2: 31 mmol/L (ref 22–32)
Calcium: 9.5 mg/dL (ref 8.9–10.3)
Chloride: 106 mmol/L (ref 98–111)
Creatinine, Ser: 2.12 mg/dL — ABNORMAL HIGH (ref 0.61–1.24)
GFR calc Af Amer: 39 mL/min — ABNORMAL LOW (ref >60)
GFR calc non Af Amer: 33 mL/min — ABNORMAL LOW (ref >60)
Glucose, Bld: 122 mg/dL — ABNORMAL HIGH (ref 70–99)
Phosphorus: 4.6 mg/dL (ref 2.5–4.6)
Potassium: 4.5 mmol/L (ref 3.5–5.1)
Sodium: 148 mmol/L — ABNORMAL HIGH (ref 135–145)

## 2019-08-29 LAB — GLUCOSE, CAPILLARY
Glucose-Capillary: 39 mg/dL — CL (ref 70–99)
Glucose-Capillary: >600 mg/dL (ref 70–99)
Glucose-Capillary: 114 mg/dL — ABNORMAL HIGH (ref 70–99)
Glucose-Capillary: 110 mg/dL — ABNORMAL HIGH (ref 70–99)
Glucose-Capillary: 116 mg/dL — ABNORMAL HIGH (ref 70–99)
Glucose-Capillary: 124 mg/dL — ABNORMAL HIGH (ref 70–99)

## 2019-08-29 LAB — CBC
HCT: 30.8 % — ABNORMAL LOW (ref 39.0–52.0)
Hemoglobin: 8.7 g/dL — ABNORMAL LOW (ref 13.0–17.0)
MCH: 21.3 pg — ABNORMAL LOW (ref 26.0–34.0)
MCHC: 28.2 g/dL — ABNORMAL LOW (ref 30.0–36.0)
MCV: 75.3 fL — ABNORMAL LOW (ref 80.0–100.0)
Platelets: 229 10*3/uL (ref 150–400)
RBC: 4.09 MIL/uL — ABNORMAL LOW (ref 4.22–5.81)
RDW: 21.7 % — ABNORMAL HIGH (ref 11.5–15.5)
WBC: 19.4 10*3/uL — ABNORMAL HIGH (ref 4.0–10.5)
nRBC: 1.9 % — ABNORMAL HIGH (ref 0.0–0.2)

## 2019-08-29 LAB — MAGNESIUM: Magnesium: 2.7 mg/dL — ABNORMAL HIGH (ref 1.7–2.4)

## 2019-08-29 LAB — PROTIME-INR
INR: 4.7 (ref 0.8–1.2)
Prothrombin Time: 44.2 seconds — ABNORMAL HIGH (ref 11.4–15.2)

## 2019-08-29 MED ORDER — FREE WATER
300.0000 mL | Freq: Three times a day (TID) | Status: DC
  Administered 2019-08-29 – 2019-08-30 (×3): 300 mL

## 2019-08-29 MED ORDER — FUROSEMIDE 10 MG/ML IJ SOLN
80.0000 mg | Freq: Once | INTRAMUSCULAR | Status: AC
  Administered 2019-08-29: 80 mg via INTRAVENOUS
  Filled 2019-08-29: qty 8

## 2019-08-29 MED ORDER — MIDODRINE HCL 5 MG PO TABS
2.5000 mg | ORAL_TABLET | Freq: Three times a day (TID) | ORAL | Status: DC
  Administered 2019-08-29 – 2019-09-02 (×11): 2.5 mg via ORAL
  Filled 2019-08-29 (×9): qty 1

## 2019-08-29 MED ORDER — HYDROCORTISONE NA SUCCINATE PF 100 MG IJ SOLR
25.0000 mg | Freq: Four times a day (QID) | INTRAMUSCULAR | Status: DC
  Administered 2019-08-29 – 2019-08-30 (×3): 25 mg via INTRAVENOUS
  Filled 2019-08-29 (×4): qty 2

## 2019-08-29 NOTE — Progress Notes (Signed)
Tube feeds restarted this PM, shortly after patient having complaints of nausea and abdominal pain. Tube feeds held, Zofran given, and KUB ordered. Merlene Laughter, NP made aware. RN instructed to restart tube feedings at a trickle rate and continue previously ordered 242mL flushes Q8. Will continue to monitor.

## 2019-08-29 NOTE — Progress Notes (Signed)
  Speech Language Pathology Treatment: Dysphagia  Patient Details Name: Lance Cook MRN: [id number] DOB: [birth year] Today's Date: 08/29/2019 Time: [id number] SLP Time Calculation (min) (ACUTE ONLY): 13 min  Assessment / Plan / Recommendation Clinical Impression  Pt quite weak, wet/congested and weak cough at baseline. Large bore NG remains in place.  Elevated HOB.  Pt required verbal cues to engage vocal folds, to increase volume.  Provided with trials of ice chips and teaspoons of water, all of which elicited consistent coughing, indicative of impaired airway protection.  Anticipate instrumental swallow study to be completed this week.  In the interim, continue NPO, allow OCCASIONAL ice chips today to facilitate mobility of swallow musculature. SLP will follow for readiness for FEES vs MBS. D/W RN, pt.   HPI HPI: 58 year old man, history of TIA, GERD, hiatal hernia, HTN, DM, Bell's palsy, MVR, atrial fibrillation, CAD with chronic systolic and diastolic CHF admitted with fatigue, weakness and hypotension and found to be profoundly bradycardic with severe left bundle branch blockand severely hyperkalemic with new acute renal failure. Intubated 12/18-12/23. On 12/18 had HD catheter removed from his carotid.       SLP Plan  Continue with current plan of care       Recommendations  Diet recommendations: NPO Medication Administration: Via alternative means                Oral Care Recommendations: Oral care QID Follow up Recommendations: Skilled Nursing facility SLP Visit Diagnosis: Dysphagia, unspecified (R13.10) Plan: Continue with current plan of care       GO                Lance Cook 08/29/2019, 9:25 AM  Estill Bamberg L. Tivis Ringer, [place] Office number [phone] Pager [phone]

## 2019-08-29 NOTE — Progress Notes (Signed)
ANTICOAGULATION CONSULT NOTE - Follow Up Consult  Pharmacy Consult for Heparin>>lovenox + warfarin Indication: Hx St Jude MVR/AVR + Afib  Patient Measurements: Height: 5' 2.21" (158 cm) Weight: 147 lb 14.9 oz (67.1 kg) IBW/kg (Calculated) : 55.07 Heparin Dosing Weight: 70 kg  Vital Signs: Temp: 97.8 F (36.6 C) (12/28 0725) Temp Source: Axillary (12/28 0725) BP: 106/72 (12/28 0700) Pulse Rate: 102 (12/28 0840)  Labs: Recent Labs    08/27/19 0459 08/28/19 0505 08/29/19 0529  HGB 8.2* 8.0* 8.7*  HCT 30.0* 28.5* 30.8*  PLT 151 170 229  LABPROT 35.1* 46.4* 44.2*  INR 3.5* 5.0* 4.7*  CREATININE 1.83* 1.93* 2.12*    Estimated Creatinine Clearance: 32.2 mL/min (A) (by C-G formula based on SCr of 2.12 mg/dL (H)).  Assessment: 58 yr old male on warfarin PTA for hx St Jude MVR + Afib - admitted with AKI and hyperkalemia requiring line replacement and start of CRRT. FFP given and heparin bridge started when INR<2.5. Pharmacy consulted to dose heparin and warfarin.   PTA warfarin dose: 5mg  daily  INR today high at 4.7  Cbc stable  Goal of Therapy:  INR 2.5-3.5 Monitor platelets by anticoagulation protocol: Yes   Plan:  Hold warfarin tonight Daily INR  Alanda Slim, PharmD, [facility] Clinical Pharmacist Please see AMION for all Pharmacists' Contact Phone Numbers 08/29/2019, 8:48 AM

## 2019-08-29 NOTE — Telephone Encounter (Signed)
DPR on file. Spoke with pt spouse who is upset that pt was recommended [employer]' to deliver low-sodium meals to pt. She states pt has been eating these meals regularly and she noticed that the sodium content for one of the meals was 500 mg and this adds up with multiple meals. Reviewed salty six with her. She denied pt indiscretion with salt. She states pt currently hospitalized and believes meals received to be the reason for admission. She attempted to contact '[employer]' but was unable to get info on why pt receiving meals with this amount of sodium because she cooks for pt as well and states she feeds him low sodium diet. She does confirm that pt on less than 1500 mg sodium diet and that pt was eating about 2 of the meals daily but she still feels 500 mg per meal too high. Informed her that nurse would attempt call to '[employer]' and [place] for more info.  Contacted [employer] and was informed by rep that [place] referred pt to [employer]. Contacted [facility] and was informed by rep that direct office near [place] location closed phone lines early and nurse can contact tomorrow.  Pt wife updated and would like triage nurse to have [facility] contact her. Will contact tomorrow

## 2019-08-29 NOTE — Progress Notes (Signed)
Pt's breath sounds noted to be increasingly more diminished on L compared to R during auscultation. Pt SATs are 95% on 2L, HR 102, RR between 30-36 pm. Pt is alert at this time. Albuterol neb given and CCM is aware and will assess pt.

## 2019-08-29 NOTE — Consult Note (Signed)
WOC Nurse Consult Note: Reason for Consult: Pressure injuries to coccyx and ischial tuberosity Wound type: Pressure plus moisture Pressure Injury POA: No Measurement: Coccyx:  Unstageable pressure injury measuring 2.5cm x 2cm with yellow wound bed, scant yellow drainage. Wound with scant yellow exudate on foam dressing. Left ischial tuberosity: Two areas of deep purple discoloration, DTPI. The more distal is the larger of the two, and measures 2.5cm x 2.5cm but the shape is irregular. No break in skin, no drainage.  The more proximal lesion measures 1.5cm x 2cm with an irregular shape. No break in skin, no drainage.  Note:  It is expected that deep tissue pressure injuries evolve into full thickness wounds after going through an evolutionary process. It is not clear whether the etiology of these wounds is pressure from the [facility] elevation or from the medical device (fecal management system). Wound bed: As described above Drainage (amount, consistency, odor) As described above Periwound: intact, dry Dressing procedure/placement/frequency: The patient's multi-system risk factors are well documented and include requiring HOB elevation for respiratory concerns.  He is being turned and repositioned every 2 hours or more, an indwelling fecal management system is in place for loose stools. A sacral foam dressing is in place.  Heels are floated using pillows and are intact.  I will add Prevalon Boots for pressure redistribution of the heels. Topical care for the coccyx and ischial tuberosity will include continuation of the silicone foam but add a small piece of xeroform gauze to the coccyxgeal area.  Collagenase (Santyl) would be a challenge to secure in this area so close to the rectum.  [place] nursing team will follow along for these newly identified pressure injuries, seeking every 7-10 days and will remain available to this patient, the nursing and medical teams.    Thanks, Maudie Flakes, MSN, RN, [place],  Arther Abbott  Pager# [phone]

## 2019-08-29 NOTE — Progress Notes (Signed)
[facility] NEPHROLOGY PROGRESS NOTE  Assessment/ Plan: Pt is a 58 y.o. yo male CHF, mechanical mitral and aortic valve A. fib, s/p VT arrest, DM, potassium of 8.4 associated with hypotension and AKI on admission.  #Severe hyperkalemia with EKG changes due to use of spironolactone, KCl and AKI: Received urgent dialysis on 12/17, started CRRT on 12/18 for fluid overload- stopped on 12/24.  Resolved.   #Acute on CKD stage III: Baseline creatinine around 1.3-1.6, likely ischemic ATN due to shock.  On CRRT 12/18 -- 12/24  with  UF.  Fairly stable GFR right now, diuresis per AHF.  Azotemia partially reflects high protein and coritcosteroid use.   #Acute on chronic hyponatremia, now mild hypernatremia: Resolved. SNa 148 today.  Add FWF to NGT  #Acute on Chronic CHF, aortic and mitral valve replacement: Per cardiology.  S/p UF with CRRT.  Improving UOP. Diuretics per AHF  #Hypotension/shock: Not on pressors currenlty. Midodrine  + hydrocortisone  #Acute respiratory failure/VDRF: Extubated on 2-4L [place]  Anemia- needs replete iron-  Has allergy listed, Tolerated test dose, getting Fe.    Subjective:    HD cath removed  1.1 LUOP, net 0.6L neg  SCr 2.1 up slightly, BUN up to 94 on Jevity, Prostat, Hydrocortisone  Pt w/o complaint  Objective Vital signs in last 24 hours: Vitals:   08/29/19 0600 08/29/19 0700 08/29/19 0725 08/29/19 0840  BP: 105/82 106/72    Pulse:  96  (!) 102  Resp: (!) 27 (!) 29  (!) 30  Temp:   97.8 F (36.6 C)   TempSrc:   Axillary   SpO2:  96%  95%  Weight:      Height:       Weight change:   Intake/Output Summary (Last 24 hours) at 08/29/2019 0941 Last data filed at 08/29/2019 0900 Gross per 24 hour  Intake 390.46 ml  Output 1305 ml  Net -914.54 ml       Labs: Basic Metabolic Panel: Recent Labs  Lab 08/27/19 0459 08/28/19 0505 08/29/19 0529  NA 138 143 148*  K 4.5 3.8 4.5  CL 102 104 106  CO2 29 31 31   GLUCOSE 149* 143*  122*  BUN 67* 82* 94*  CREATININE 1.83* 1.93* 2.12*  CALCIUM 8.5* 9.1 9.5  PHOS 4.2 4.0 4.6   Liver Function Tests: Recent Labs  Lab 08/26/19 2021 08/27/19 0459 08/28/19 0505 08/29/19 0529  AST 11*  --   --   --   ALT 18  --   --   --   ALKPHOS 66  --   --   --   BILITOT 0.2*  --   --   --   PROT 5.0*  --   --   --   ALBUMIN 2.0* 1.9* 1.9* 2.2*   No results for input(s): LIPASE, AMYLASE in the last 168 hours. Recent Labs  Lab 08/26/19 2021  AMMONIA 28   CBC: Recent Labs  Lab 08/25/19 0538 08/26/19 0356 08/27/19 0459 08/28/19 0505 08/29/19 0529  WBC 12.2* 12.3* 12.4* 17.0* 19.4*  HGB 8.1* 7.9* 8.2* 8.0* 8.7*  HCT 29.0* 27.9* 30.0* 28.5* 30.8*  MCV 74.7* 73.4* 75.0* 74.6* 75.3*  PLT 126* 147* 151 170 229   Cardiac Enzymes: No results for input(s): CKTOTAL, CKMB, CKMBINDEX, TROPONINI in the last 168 hours. CBG: Recent Labs  Lab 08/28/19 1204 08/28/19 1543 08/28/19 2035 08/29/19 0211 08/29/19 0540  GLUCAP 126* 91 120* 114* 110*    Iron Studies:  No results  for input(s): IRON, TIBC, TRANSFERRIN, FERRITIN in the last 72 hours. Studies/Results: DG Abd 1 View  Result Date: 08/28/2019 CLINICAL DATA:  NG tube insertion. EXAM: ABDOMEN - 1 VIEW 4:39 p.m. COMPARISON:  08/28/2019 at 11:54 a.m. FINDINGS: The NG tube has been advanced and the tip is now in the fundus of the stomach. No dilated bowel. Haziness at both lung bases probably represent small effusions. Cardiomegaly. Mitral valve prosthesis. IMPRESSION: Benign-appearing abdomen. The tip of the nasogastric tube is in the fundus of the stomach. Haziness at both lung bases likely represent small effusions. Electronically Signed   By: Lorriane Shire M.D.   On: 08/28/2019 16:57   AM DG Chest Port 1 View  Result Date: 08/28/2019 CLINICAL DATA:  58 year old male s/p cardiogenic shock. Abnormal respiration. Negative for COVID-19. EXAM: PORTABLE CHEST 1 VIEW COMPARISON:  08/20/2019 and earlier. FINDINGS: Portable AP  upright view at 0537 hours. Extubated. An enteric tube is in place, with side hole at the level of the stomach. Stable lung volumes compared to 08/20/2019. Stable cardiomegaly and mediastinal contours. Prosthetic cardiac valve and left chest AICD again noted. Patchy and nodular right lung opacity has mildly regressed, with a more reticulonodular appearance now. Left lung perihilar ventilation also appears improved. No pneumothorax. No pleural effusion is evident. Negative visible bowel gas pattern. IMPRESSION: 1. Extubated. 2. Enteric tube in place, side hole at the level of the stomach. 3. Improved ventilation since 08/20/2019. Residual reticulonodular opacity in the right lung might be asymmetric edema or infection. Electronically Signed   By: Genevie Ann M.D.   On: 08/28/2019 07:26   DG Abd Portable 1V  Result Date: 08/28/2019 CLINICAL DATA:  NG tube placement EXAM: PORTABLE ABDOMEN - 1 VIEW COMPARISON:  08/24/2019 FINDINGS: 1154 hours. Portable upright view of the lower chest and upper abdomen shows the tip of an NG tube at the GE junction with proximal side port in the distal esophagus. Cardiopericardial silhouette is enlarged. There is bibasilar airspace disease better characterized on chest x-ray earlier today. Bowel gas pattern is nonspecific. IMPRESSION: NG tube tip is in the distal esophagus at the esophagogastric junction. Electronically Signed   By: Misty Stanley M.D.   On: 08/28/2019 12:12    Medications: Infusions: . sodium chloride 10 mL/hr at 08/29/19 0900  . feeding supplement (JEVITY 1.5 CAL/FIBER) Stopped (08/28/19 1846)  . ferric gluconate (FERRLECIT/NULECIT) IV 125 mg (08/29/19 0930)  . norepinephrine (LEVOPHED) Adult infusion Stopped (08/27/19 0448)    Scheduled Medications: . albuterol  2.5 mg Nebulization TID  . amiodarone  200 mg Per Tube BID  . chlorhexidine  15 mL Mouth Rinse BID  . Chlorhexidine Gluconate Cloth  6 each Topical Q0600  . darbepoetin (ARANESP) injection -  NON-DIALYSIS  100 mcg Subcutaneous Q Wed-1800  . feeding supplement (PRO-STAT SUGAR FREE 64)  30 mL Per Tube BID  . hydrocortisone sodium succinate  50 mg Intravenous Q6H  . hydroxypropyl methylcellulose / hypromellose  1 drop Both Eyes TID  . insulin aspart  2-6 Units Subcutaneous Q6H  . mouth rinse  15 mL Mouth Rinse q12n4p  . midodrine  5 mg Oral TID WC  . pantoprazole sodium  40 mg Per Tube Daily  . Warfarin - Pharmacist Dosing Inpatient   Does not apply q1800    have reviewed scheduled and prn medications.  Physical Exam: General: awake, alert Heart:RRR, s1s2 nl Lungs: Coarse breath sound bilateral Abdomen:soft,  non-distended Extremities: LE edema + pitting to dep areas Dialysis Access: none present  Zakyla Tonche B Timothy Trudell 08/29/2019,9:41 AM  LOS: 11 days

## 2019-08-29 NOTE — Telephone Encounter (Signed)
Spoke to patients wife who would like to speak with the person in charge of [employer]. States its important she is called today.

## 2019-08-29 NOTE — Progress Notes (Addendum)
Advanced Heart Failure Rounding Note   Subjective:    CVVHD stopped 12/24. Trialysis cath removed per nephrology.   Got lasix 120 IV x 1 on 12/26 w/ 2.1L out. No Lasix given yesterday (CVP was 8-9). 1.1 L in UOP yesterday w/o diuretics. No CVP set up today.   Scr gradually increasing back up, 1.83>>1.93>>2.12. K 4.5.  Wt up from 125>>147lb in 24 hrs (doubt accurate). Tachycardic in the 110s.   Looks very weak but he voices no complaints.     Objective:   Weight Range:  Vital Signs:   Temp:  [97.8 F (36.6 C)-98.8 F (37.1 C)] 97.8 F (36.6 C) (12/28 0725) Pulse Rate:  [86-102] 102 (12/28 0840) Resp:  [11-36] 28 (12/28 1000) BP: (105-143)/(65-94) 143/89 (12/28 1000) SpO2:  [95 %-100 %] 95 % (12/28 0840) Weight:  [67.1 kg] 67.1 kg (12/28 0402) Last BM Date: 08/29/19  Weight change: Filed Weights   08/26/19 0341 08/27/19 0453 08/29/19 0402  Weight: 65.4 kg 57 kg 67.1 kg    Intake/Output:   Intake/Output Summary (Last 24 hours) at 08/29/2019 1115 Last data filed at 08/29/2019 1000 Gross per 24 hour  Intake 277.66 ml  Output 1280 ml  Net -1002.34 ml     Physical Exam: General:  Chronically-ill appearing/ thin WM HEENT: normal, NGT Neck: supple. R neck wound Carotids 2+ bilat; no bruits. No lymphadenopathy or thryomegaly appreciated. Cor: PMI nondisplaced. Irregular rhythm, tachy rate. + mechanical valve sounds Lungs: CTAB Abdomen: soft, nontender, mildly distended. No hepatosplenomegaly. No bruits or masses. Good bowel sounds. Extremities: no cyanosis, clubbing, rash, trace bilateral LE edema w/ chronic venous stasis dermatitis  Neuro: alert & orientedx3, cranial nerves grossly intact. moves all 4 extremities w/o difficulty. Affect flat   Telemetry: A fib 110s Personally reviewed   Labs: Basic Metabolic Panel: Recent Labs  Lab 08/25/19 0538 08/25/19 1542 08/26/19 0356 08/26/19 2021 08/26/19 2032 08/27/19 0459 08/28/19 0505 08/29/19 0529  NA  137  136 135 135 136 134* 138 143 148*  K 3.9  3.9 4.0 4.1 4.3 4.2 4.5 3.8 4.5  CL 102  101 102 100 100  --  102 104 106  CO2 26  26 25 25 26   --  29 31 31   GLUCOSE 124*  122* 113* 115* 152*  --  149* 143* 122*  BUN 23*  24* 35* 48* 59*  --  67* 82* 94*  CREATININE 0.97  0.98 1.46* 1.82* 1.82*  --  1.83* 1.93* 2.12*  CALCIUM 8.5*  8.5* 8.0* 8.4* 8.6*  --  8.5* 9.1 9.5  MG 2.4  --  2.5*  --   --  2.6* 2.5* 2.7*  PHOS 2.0*  1.9* 3.6 3.5  --   --  4.2 4.0 4.6    Liver Function Tests: Recent Labs  Lab 08/26/19 0356 08/26/19 2021 08/27/19 0459 08/28/19 0505 08/29/19 0529  AST  --  11*  --   --   --   ALT  --  18  --   --   --   ALKPHOS  --  66  --   --   --   BILITOT  --  0.2*  --   --   --   PROT  --  5.0*  --   --   --   ALBUMIN 2.0* 2.0* 1.9* 1.9* 2.2*   No results for input(s): LIPASE, AMYLASE in the last 168 hours. Recent Labs  Lab 08/26/19 2021  AMMONIA 28  CBC: Recent Labs  Lab 08/25/19 0538 08/26/19 0356 08/26/19 2032 08/27/19 0459 08/28/19 0505 08/29/19 0529  WBC 12.2* 12.3*  --  12.4* 17.0* 19.4*  HGB 8.1* 7.9* 9.9* 8.2* 8.0* 8.7*  HCT 29.0* 27.9* 29.0* 30.0* 28.5* 30.8*  MCV 74.7* 73.4*  --  75.0* 74.6* 75.3*  PLT 126* 147*  --  151 170 229    Cardiac Enzymes: No results for input(s): CKTOTAL, CKMB, CKMBINDEX, TROPONINI in the last 168 hours.  BNP: BNP (last 3 results) Recent Labs    07/19/19 0956 08/02/19 0957 08/14/2019 1424  BNP 298.7* 167.3* 106.1*    ProBNP (last 3 results) No results for input(s): PROBNP in the last 8760 hours.    Other results:  Imaging: DG Abd 1 View  Result Date: 08/28/2019 CLINICAL DATA:  NG tube insertion. EXAM: ABDOMEN - 1 VIEW 4:39 p.m. COMPARISON:  08/28/2019 at 11:54 a.m. FINDINGS: The NG tube has been advanced and the tip is now in the fundus of the stomach. No dilated bowel. Haziness at both lung bases probably represent small effusions. Cardiomegaly. Mitral valve prosthesis. IMPRESSION:  Benign-appearing abdomen. The tip of the nasogastric tube is in the fundus of the stomach. Haziness at both lung bases likely represent small effusions. Electronically Signed   By: Lorriane Shire M.D.   On: 08/28/2019 16:57   AM DG Chest Port 1 View  Result Date: 08/28/2019 CLINICAL DATA:  58 year old male s/p cardiogenic shock. Abnormal respiration. Negative for COVID-19. EXAM: PORTABLE CHEST 1 VIEW COMPARISON:  08/20/2019 and earlier. FINDINGS: Portable AP upright view at 0537 hours. Extubated. An enteric tube is in place, with side hole at the level of the stomach. Stable lung volumes compared to 08/20/2019. Stable cardiomegaly and mediastinal contours. Prosthetic cardiac valve and left chest AICD again noted. Patchy and nodular right lung opacity has mildly regressed, with a more reticulonodular appearance now. Left lung perihilar ventilation also appears improved. No pneumothorax. No pleural effusion is evident. Negative visible bowel gas pattern. IMPRESSION: 1. Extubated. 2. Enteric tube in place, side hole at the level of the stomach. 3. Improved ventilation since 08/20/2019. Residual reticulonodular opacity in the right lung might be asymmetric edema or infection. Electronically Signed   By: Genevie Ann M.D.   On: 08/28/2019 07:26   DG Abd Portable 1V  Result Date: 08/28/2019 CLINICAL DATA:  NG tube placement EXAM: PORTABLE ABDOMEN - 1 VIEW COMPARISON:  08/24/2019 FINDINGS: 1154 hours. Portable upright view of the lower chest and upper abdomen shows the tip of an NG tube at the GE junction with proximal side port in the distal esophagus. Cardiopericardial silhouette is enlarged. There is bibasilar airspace disease better characterized on chest x-ray earlier today. Bowel gas pattern is nonspecific. IMPRESSION: NG tube tip is in the distal esophagus at the esophagogastric junction. Electronically Signed   By: Misty Stanley M.D.   On: 08/28/2019 12:12     Medications:     Scheduled Medications: .  albuterol  2.5 mg Nebulization TID  . amiodarone  200 mg Per Tube BID  . chlorhexidine  15 mL Mouth Rinse BID  . Chlorhexidine Gluconate Cloth  6 each Topical Q0600  . darbepoetin (ARANESP) injection - NON-DIALYSIS  100 mcg Subcutaneous Q Wed-1800  . feeding supplement (PRO-STAT SUGAR FREE 64)  30 mL Per Tube BID  . free water  300 mL Per Tube Q8H  . hydrocortisone sodium succinate  50 mg Intravenous Q6H  . hydroxypropyl methylcellulose / hypromellose  1 drop Both Eyes TID  .  insulin aspart  2-6 Units Subcutaneous Q6H  . mouth rinse  15 mL Mouth Rinse q12n4p  . midodrine  5 mg Oral TID WC  . pantoprazole sodium  40 mg Per Tube Daily  . Warfarin - Pharmacist Dosing Inpatient   Does not apply q1800    Infusions: . sodium chloride 10 mL/hr at 08/29/19 0900  . feeding supplement (JEVITY 1.5 CAL/FIBER) Stopped (08/28/19 1846)  . ferric gluconate (FERRLECIT/NULECIT) IV 125 mg (08/29/19 0930)  . norepinephrine (LEVOPHED) Adult infusion Stopped (08/27/19 0448)    PRN Medications: sodium chloride, bisacodyl, heparin, lip balm, ondansetron (ZOFRAN) IV, polyethylene glycol, sennosides   Assessment/Plan:   1. AKI on CKD stage 3: Baseline creatinine 1.4-1.6 Creatinine up to 2.7 at admission with marked hyperkalemia and hyponatremia in setting of use of torsemide, metolazone, and spironolactone at home.  He had urgent HD with improvement.  - CVVHD stopped 12/24. Urine output improved with IV lasix x 1 on 12/26. Creatinine trending back up, now at 2.12 - Renal pulled trialysis cath yesterday  - Diuretics held yesterday w/ 1.1 L in UOP. Wt increase documented but doubt wts accurate. No CVP set up. Does not appear grossly volume overloaded. With rising SCr, will continue to hold lasix today. 2. Acute on chronic systolic CHF/cardiogenic shock: EF 30-35% on last echo.  [employer] CRT-D, LV not on due to diaphragmatic stimulation.  Hypotension in setting of AKI/hyperkalemia. - Volume overload  much improved with CVVHD.  - Diuretics held yesterday w/ 1.1 L in UOP. Wt increase documented but doubt wts accurate. No CVP set up. Does not appear grossly volume overloaded on exam.  Also tachy in the110s. Suspect he is a little dry. With rising SCr, will continue to hold lasix today. - Off NE. Will cut midodrine back to 2.5 tid today. Hopefully can stop tomorrow - Will not restart HF meds yet 3. VT: Patient has [employer] CRT-D device, LV lead off due to diaphragmatic pacing.  Patient has has runs of VT, treated w/ amiodarone gtt.  - VT quiescent - Continue amio at 200 po bid - Keep K > 4.0 and Mg > 2.0 (K 4.5 and Mg 2.7 today) 4. Digoxin toxicity: Severely elevated digoxin level, has had digibind and HD.  - bradycardia improved 5. CAD: Last cath in 2016 with patent native coronaries, probably occluded SVG-RCA.  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  - Continue heparin/warfarin. Hgb stable at 8.7. INR 4.7. PharmD managing 7. Atrial fibrillation: Chronic.   - rate in the 110s currently. ? If dry. Holding diuretics today. - Continue po amio  8. Acute hypoxic respiratory failure -Extubated on 2 liters [place]  9. Ab pain - improving. KUB ok 10. Debility, severe - PT/OT to see. Attempt to get OOB to chair   Length of Stay: [place] PA-C 08/29/2019, 11:15 AM  Advanced Heart Failure Team Pager [phone] (M-F; [place])  Please contact [facility] for night-coverage after hours (4p -7a ) and weekends on [email or web address]  Patient seen with PA, agree with the above note.   Feels weak, worked some with PT but needed a lot of assistance.    Remains in atrial fibrillation rate around 100 bpm.  BUN/creatinine climbing again, dialysis catheter was removed.  No access for CVP.   General: Chronically ill-appearing.  Neck: JVP 10-12 cm, no thyromegaly or thyroid nodule.  Lungs: Decreased at bases.  CV: Nondisplaced PMI.  Heart irregular S1/S2 with  mechanical  S1S2, no S3/S4, 2/6 HSM LLSB.  1+ ankle edema.   Abdomen: Soft, nontender, no hepatosplenomegaly, no distention.  Skin: venous stasis changes lower legs.  Neurologic: Alert and oriented x 3.  Psych: Normal affect. Extremities: No clubbing or cyanosis.  HEENT: Normal.   Mild volume overload on exam but BUN/creatinine rising again.  - Lasix 80 mg IV x 1 today.  - Renal following, no CVVH since 12/24 but rise in renal indices concerning.  - Midodrine down to 2.5 mg tid, hopefully can stop tomorrow.   Atrial fibrillation is chronic, rate around 100 on amiodarone 200 mg bid.  On amiodarone at home for VT.   Wean hydrocortisone to 25 mg q6 hrs.   INR high, adjust warfarin.   Very weak, needs aggressive PT.   Loralie Champagne 08/29/2019 2:20 PM

## 2019-08-29 NOTE — Progress Notes (Signed)
Physical Therapy Treatment Patient Details Name: Lance Cook MRN: [id number] DOB: 1961/06/06 Today's Date: 08/29/2019    History of Present Illness 58 y.o. yo male with h/o CHF (EF 30%), mechanical mitral and aortic valve A. fib, s/p VT arrest, ICD, DM, Bell's palsy, CVA came to the ER 08/31/2019 with fatigue, weakness and hypotension; potassium of 8.4 associated with hypotension and AKI. IJ catheter placed for HD (went into rt carotid artery and required surgery for artery repair); 12/18 remained intubated after surgery, began CRRT; extubated 08/24/19    PT Comments    Pt making steady progress. Too weak to stand but did tolerate OOB to chair via lift.   Follow Up Recommendations  SNF;Supervision/Assistance - 24 hour(will continue to assess as pt able to tolerate incr mobility)     Equipment Recommendations  Other (comment)(TBD at next venue)    Recommendations for Other Services       Precautions / Restrictions Precautions Precautions: Fall;Other (comment) Precaution Comments: lines    Mobility  Bed Mobility Overal bed mobility: Needs Assistance Bed Mobility: Rolling;Supine to Sit;Sit to Supine Rolling: Min assist   Supine to sit: Mod assist Sit to supine: Mod assist   General bed mobility comments: Assist to bring legs off of bed, elevate trunk into sitting and bring hips to EOB. Assist to lower trunk and bring legs back up into bed.   Transfers Overall transfer level: Needs assistance               General transfer comment: Used maxisky for bed to chair due to weakness and short stature  Ambulation/Gait                 Stairs             Wheelchair Mobility    Modified Rankin (Stroke Patients Only)       Balance Overall balance assessment: Needs assistance Sitting-balance support: Bilateral upper extremity supported;Feet supported Sitting balance-Leahy Scale: Poor Sitting balance - Comments: UE assist and min guard for static standing                                     Cognition Arousal/Alertness: Awake/alert Behavior During Therapy: Flat affect Overall Cognitive Status: Difficult to assess                         Following Commands: Follows one step commands inconsistently;Follows one step commands with increased time       General Comments: Pt very difficult to understand due to low volume      Exercises General Exercises - Lower Extremity Long Arc Quad: AAROM;Both;5 reps;Seated    General Comments        Pertinent Vitals/Pain Pain Assessment: Faces Faces Pain Scale: Hurts little more Pain Location: all over Pain Descriptors / Indicators: Grimacing;Guarding;Restless Pain Intervention(s): Limited activity within patient's tolerance;Monitored during session;Repositioned    Home Living                      Prior Function            PT Goals (current goals can now be found in the care plan section) Acute Rehab PT Goals Patient Stated Goal: none stated; wife wants pt to come home Progress towards PT goals: Progressing toward goals    Frequency    Min 2X/week      PT Plan Current  plan remains appropriate    Co-evaluation              AM-PAC PT "6 Clicks" Mobility   Outcome Measure  Help needed turning from your back to your side while in a flat bed without using bedrails?: A Little Help needed moving from lying on your back to sitting on the side of a flat bed without using bedrails?: A Lot Help needed moving to and from a bed to a chair (including a wheelchair)?: Total Help needed standing up from a chair using your arms (e.g., wheelchair or bedside chair)?: Total Help needed to walk in hospital room?: Total Help needed climbing 3-5 steps with a railing? : Total 6 Click Score: 9    End of Session Equipment Utilized During Treatment: Oxygen Activity Tolerance: Patient limited by fatigue Patient left: with call bell/phone within reach;in  chair;with chair alarm set Nurse Communication: Mobility status;Need for lift equipment(Nurse present for transfer) PT Visit Diagnosis: Muscle weakness (generalized) (M62.81);Difficulty in walking, not elsewhere classified (R26.2)     Time: [id number] PT Time Calculation (min) (ACUTE ONLY): 35 min  Charges:  $Therapeutic Activity: 23-37 mins                     [place] Pager [phone] Office [place] 08/29/2019, 2:07 PM

## 2019-08-30 ENCOUNTER — Other Ambulatory Visit: Payer: Self-pay

## 2019-08-30 DIAGNOSIS — R791 Abnormal coagulation profile: Secondary | ICD-10-CM

## 2019-08-30 DIAGNOSIS — N179 Acute kidney failure, unspecified: Secondary | ICD-10-CM

## 2019-08-30 LAB — CBC
HCT: 32.3 % — ABNORMAL LOW (ref 39.0–52.0)
Hemoglobin: 9.1 g/dL — ABNORMAL LOW (ref 13.0–17.0)
MCH: 21.4 pg — ABNORMAL LOW (ref 26.0–34.0)
MCHC: 28.2 g/dL — ABNORMAL LOW (ref 30.0–36.0)
MCV: 75.8 fL — ABNORMAL LOW (ref 80.0–100.0)
Platelets: 274 10*3/uL (ref 150–400)
RBC: 4.26 MIL/uL (ref 4.22–5.81)
RDW: 22.6 % — ABNORMAL HIGH (ref 11.5–15.5)
WBC: 26.2 10*3/uL — ABNORMAL HIGH (ref 4.0–10.5)
nRBC: 7.7 % — ABNORMAL HIGH (ref 0.0–0.2)

## 2019-08-30 LAB — RENAL FUNCTION PANEL
Albumin: 2.2 g/dL — ABNORMAL LOW (ref 3.5–5.0)
Anion gap: 12 (ref 5–15)
BUN: 112 mg/dL — ABNORMAL HIGH (ref 6–20)
CO2: 31 mmol/L (ref 22–32)
Calcium: 9.4 mg/dL (ref 8.9–10.3)
Chloride: 108 mmol/L (ref 98–111)
Creatinine, Ser: 2.39 mg/dL — ABNORMAL HIGH (ref 0.61–1.24)
GFR calc Af Amer: 33 mL/min — ABNORMAL LOW (ref >60)
GFR calc non Af Amer: 29 mL/min — ABNORMAL LOW (ref >60)
Glucose, Bld: 117 mg/dL — ABNORMAL HIGH (ref 70–99)
Phosphorus: 4.2 mg/dL (ref 2.5–4.6)
Potassium: 3.7 mmol/L (ref 3.5–5.1)
Sodium: 151 mmol/L — ABNORMAL HIGH (ref 135–145)

## 2019-08-30 LAB — GLUCOSE, CAPILLARY
Glucose-Capillary: 132 mg/dL — ABNORMAL HIGH (ref 70–99)
Glucose-Capillary: 142 mg/dL — ABNORMAL HIGH (ref 70–99)
Glucose-Capillary: 160 mg/dL — ABNORMAL HIGH (ref 70–99)
Glucose-Capillary: 173 mg/dL — ABNORMAL HIGH (ref 70–99)
Glucose-Capillary: 99 mg/dL (ref 70–99)

## 2019-08-30 LAB — PROTIME-INR
INR: 7.5 (ref 0.8–1.2)
INR: 5.9 (ref 0.8–1.2)
Prothrombin Time: 64.3 seconds — ABNORMAL HIGH (ref 11.4–15.2)
Prothrombin Time: 53.2 seconds — ABNORMAL HIGH (ref 11.4–15.2)

## 2019-08-30 LAB — TYPE AND SCREEN
ABO/RH(D): B POS
Antibody Screen: NEGATIVE

## 2019-08-30 LAB — MAGNESIUM: Magnesium: 2.8 mg/dL — ABNORMAL HIGH (ref 1.7–2.4)

## 2019-08-30 MED ORDER — VITAMIN K1 10 MG/ML IJ SOLN
5.0000 mg | Freq: Every day | INTRAVENOUS | Status: DC
  Filled 2019-08-30: qty 0.5

## 2019-08-30 MED ORDER — VITAMIN K1 10 MG/ML IJ SOLN
5.0000 mg | Freq: Once | INTRAVENOUS | Status: AC
  Administered 2019-08-30: 5 mg via INTRAVENOUS
  Filled 2019-08-30 (×2): qty 0.5

## 2019-08-30 MED ORDER — FREE WATER: 300.0000 mL | Status: DC

## 2019-08-30 MED ORDER — HYDROCORTISONE NA SUCCINATE PF 100 MG IJ SOLR
25.0000 mg | Freq: Two times a day (BID) | INTRAMUSCULAR | Status: AC
  Administered 2019-08-30 – 2019-09-01 (×4): 25 mg via INTRAVENOUS
  Filled 2019-08-30 (×3): qty 2

## 2019-08-30 MED ORDER — INSULIN ASPART 100 UNIT/ML ~~LOC~~ SOLN
2.0000 [IU] | Freq: Four times a day (QID) | SUBCUTANEOUS | Status: DC
  Administered 2019-08-30 – 2019-08-31 (×2): 4 [IU] via SUBCUTANEOUS
  Administered 2019-09-01 – 2019-09-02 (×4): 2 [IU] via SUBCUTANEOUS
  Administered 2019-09-03: 6 [IU] via SUBCUTANEOUS
  Administered 2019-09-03: 2 [IU] via SUBCUTANEOUS
  Administered 2019-09-03 (×2): 4 [IU] via SUBCUTANEOUS
  Administered 2019-09-03: 2 [IU] via SUBCUTANEOUS
  Administered 2019-09-04 (×2): 4 [IU] via SUBCUTANEOUS
  Administered 2019-09-05: 03:00:00 2 [IU] via SUBCUTANEOUS

## 2019-08-30 MED ORDER — JEVITY 1.5 CAL/FIBER PO LIQD
1000.0000 mL | ORAL | Status: DC
  Administered 2019-08-30 – 2019-09-02 (×3): 1000 mL
  Filled 2019-08-30 (×5): qty 1000

## 2019-08-30 MED ORDER — FREE WATER
50.0000 mL | Status: DC
  Administered 2019-08-30 – 2019-09-01 (×27): 50 mL

## 2019-08-30 MED ORDER — SODIUM CHLORIDE 0.9% IV SOLUTION: Freq: Once | INTRAVENOUS | Status: DC

## 2019-08-30 NOTE — Telephone Encounter (Signed)
Spoke with Jamison Neighbor. at [employer] Informed her that pt wife wanted to discuss pt diet and [facility]'s recommendation to use [employer]. Informed Rhonda that triage nurse contacted [employer] and was informed that [facility] set pt up to receive food from [employer]. Requested that someone from [facility] contact pt wife per her request. Jamison Neighbor. States she will have a [facility] nurse contact pt wife. Provided her with pt wife contact info.  Returned call to Amy from [employer]. Informed her about the pt's wife concerns about [employer]. She is familiar with pt and pt wife history. Informed her that triage nurse has contacted [facility] and a nurse from [facility] will be contacting pt wife. She verbalized understanding

## 2019-08-30 NOTE — NC FL2 (Signed)
[place] LEVEL OF CARE SCREENING TOOL     IDENTIFICATION  Patient Name: Lance Cook Birthdate: February 15, 1961 Sex: male Admission Date (Current Location): 08/15/2019  [facility] and [place] Number:  Herbalist and Address:  The [place]. [facility], [place] [address], [place], [place] [address]      Provider Number: [id number]  Attending Physician Name and Address:  Margaretha Seeds, MD  Relative Name and Phone Number:  [phone]    Current Level of Care: Hospital Recommended Level of Care: [place] Prior Approval Number:    Date Approved/Denied:   PASRR Number: [id number]  Discharge Plan: SNF    Current Diagnoses: Patient Active Problem List   Diagnosis Date Noted  . Respiration abnormal   . Protein-calorie malnutrition, severe 08/23/2019  . Cardiogenic shock ([place])   . Central venous catheter in place   . Hyperkalemia 08/26/2019  . Bradycardia   . Acute on chronic systolic CHF (congestive heart failure), NYHA class 4 ([place]) 06/21/2019  . Acute on chronic diastolic (congestive) heart failure ([place]) 04/01/2019  . Acute on chronic congestive heart failure ([place])   . Anasarca   . Pressure injury of skin 02/14/2019  . Acute on chronic heart failure ([place]) 02/13/2019  . Benign neoplasm of ascending colon   . Benign neoplasm of rectum   . Benign neoplasm of rectosigmoid junction   . Benign neoplasm of sigmoid colon   . DM type 2 with diabetic dyslipidemia ([place]) 04/26/2018  . Lower extremity edema 03/08/2018  . Other fatigue 01/28/2018  . Chronic anticoagulation 01/05/2018  . Insomnia 11/23/2017  . Unilateral vocal cord paralysis 11/30/2016  . Leg swelling 11/28/2016  . S/P MVR (mitral valve replacement)   . Acute on chronic diastolic CHF (congestive heart failure) ([place]) 08/27/2016  . Chronic pain syndrome 07/10/2016  . Chronic diarrhea   . Benign neoplasm of transverse colon   . Scrotal swelling 05/30/2016  .  Musculoskeletal pain 01/07/2016  . Personal history of malignant neoplasm of testis 07/12/2015  . Long term current use of opiate analgesic 07/11/2015  . Long term prescription opiate use 07/11/2015  . Opiate use (22.5 MME/day) 07/11/2015  . Opiate dependence ([place]) 07/11/2015  . Encounter for therapeutic drug level monitoring 07/11/2015  . Chronic low back pain (midline) 07/11/2015  . Lumbar facet syndrome 07/11/2015  . Chronic lower extremity pain (Left) 07/11/2015  . Adult BMI 30+ 06/19/2015  . Obesity (BMI 30-39.9) 06/19/2015  . Hypomagnesemia 02/08/2015  . Physical deconditioning 02/08/2015  . Acute encephalopathy 02/05/2015  . Enteritis due to Clostridium difficile 01/30/2015  . Acute respiratory failure with hypoxemia ([place])   . 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 [id number]  . Chronic diastolic congestive heart failure ([place])   . Type 2 diabetes mellitus with other specified complication ([place]) [id number]  . 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 Venous insufficiency of both lower extremities 04/02/2014  . Morbid obesity ([place]) 04/02/2014  . Diabetes mellitus ([place]) 03/14/2014  . Personal history of transient ischemic attack (TIA), and cerebral infarction without residual deficits 02/23/2014  . Malignant neoplasm of testis ([place]) 02/23/2014  . H/O TIA (transient ischemic attack) and stroke 02/23/2014  . HTN (hypertension) 02/23/2014  . Lymphadenopathy 02/23/2014  . S/P AVR (aortic valve replacement) 02/23/2014  . Encounter for therapeutic drug monitoring 02/02/2014  . Long term current use of anticoagulant therapy 07/15/2012  . Hypogonadism male 04/08/2012  . 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 Testicular hypofunction 04/08/2012  . Male hypogonadism 04/08/2012  . Other testicular hypofunction 04/08/2012  . History of colonic polyps 12/23/2011  . Testicular cancer ([place])   . TIA (transient ischemic attack)   . OSA  (obstructive sleep apnea)   . Lumbar spinal stenosis (Severe L4-5) 10/08/2011  . ED (erectile dysfunction) of organic origin 10/08/2011  . Lumbar canal stenosis 10/08/2011  . Low back pain 10/08/2011  . Spinal stenosis of lumbar region without neurogenic claudication 10/08/2011  . CAD (coronary artery disease)   . Spondylosis 04/29/2010  . DYSLIPIDEMIA 01/15/2010  . Chronic venous hypertension with ulcer ([place]) 01/15/2010  . Essential hypertension 12/14/2009  . Gout 11/01/2009  . Anemia, iron deficiency 11/01/2009  . MYOCARDIAL INFARCTION, HX OF 11/01/2009  . Atrial fibrillation, chronic 11/01/2009  . PERIPHERAL EDEMA 11/01/2009  . Heart valve replaced by other means 11/01/2009  . Presence of other heart-valve replacement 11/01/2009  . Ascending aortic dissection ([place]) 07/14/2008  . 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 Venous (peripheral) insufficiency 02/03/2005    Orientation RESPIRATION BLADDER Height & Weight     Self, Time, Situation, Place  O2 Incontinent Weight: 153 lb 10.6 oz (69.7 kg) Height:  5' 2.21" (158 cm)  BEHAVIORAL SYMPTOMS/MOOD NEUROLOGICAL BOWEL NUTRITION STATUS      Incontinent    AMBULATORY STATUS COMMUNICATION OF NEEDS Skin   Extensive Assist Verbally                         Personal Care Assistance Level of Assistance  Bathing, Dressing, Feeding Bathing Assistance: Limited assistance Feeding assistance: Limited assistance Dressing Assistance: Limited assistance     Functional Limitations Info  Sight, Hearing, Speech Sight Info: Impaired(Patient wears glasses) Hearing Info: Adequate Speech Info: Impaired(Low volume at this time, recent extubation)    [place]  PT (By licensed PT), OT (By licensed OT)     PT Frequency: 5x weekly OT Frequency: 5x weekly            Contractures Contractures Info: Not present    Additional Factors Info  Code Status, Allergies Code Status Info: Full  Code Allergies Info: Feraheme, Penicillins, Sulfamethoxazole-trimethoprim, Tape           Current Medications (08/30/2019):  This is the current hospital active medication list Current Facility-Administered Medications  Medication Dose Route Frequency Provider Last Rate Last Admin  . 0.9 %  sodium chloride infusion (Manually program via Guardrails IV Fluids)   Intravenous Once Ogan, [place]or[place]wo U, MD      . 0.9 %  sodium chloride infusion   Intravenous PRN Kipp Brood, MD 10 mL/hr at 08/30/19 1106 500 mL at 08/30/19 1106  . albuterol (PROVENTIL) (2.5 MG/3ML) 0.083% nebulizer solution 2.5 mg  2.5 mg Nebulization TID Spero Geralds, MD   2.5 mg at 08/30/19 [id number]  . amiodarone (PACERONE) tablet 200 mg  200 mg Per Tube BID Rush Farmer, MD   200 mg at 08/30/19 0950  . bisacodyl (DULCOLAX) suppository 10 mg  10 mg Rectal Daily PRN Kipp Brood, MD      . chlorhexidine (PERIDEX) 0.12 % solution 15 mL  15 mL Mouth Rinse BID Rush Farmer, MD   15 mL at 08/30/19 1056  . Chlorhexidine Gluconate Cloth 2 % PADS 6 each  6 each Topical Q0600 Angelia Mould, MD   6 each at 08/30/19 [phone]  . Darbepoetin Alfa (ARANESP) injection 100 mcg  100 mcg Subcutaneous Q Wed-1800 Corliss Parish, MD   100 mcg at 08/24/19 1723  . feeding supplement (JEVITY 1.5 CAL/FIBER) liquid 1,000 mL  1,000 mL Per Tube Continuous Margaretha Seeds, MD 20 mL/hr at 08/30/19 1036 1,000 mL at 08/30/19 1036  . feeding supplement (PRO-STAT SUGAR FREE 64) liquid 30 mL  30 mL Per Tube BID Rush Farmer, MD   30 mL at 08/30/19 0950  . ferric gluconate (NULECIT) 125 mg in sodium chloride 0.9 % 100 mL IVPB  125 mg Intravenous Daily Corliss Parish, MD 110 mL/hr at 08/30/19 1108 125 mg at 08/30/19 1108  . free water 50 mL  50 mL Per Tube Q1H Hicks, Samantha B, NP   50 mL at 08/30/19 1109  . heparin injection 1,000-6,000 Units  1,000-6,000 Units CRRT PRN Rush Farmer, MD      . hydrocortisone sodium succinate  (SOLU-CORTEF) 100 MG injection 25 mg  25 mg Intravenous Q12H Margaretha Seeds, MD      . hydroxypropyl methylcellulose / hypromellose (ISOPTO TEARS / GONIOVISC) 2.5 % ophthalmic solution 1 drop  1 drop Both Eyes TID Merlene Laughter F, NP   1 drop at 08/30/19 0953  . insulin aspart (novoLOG) injection 2-6 Units  2-6 Units Subcutaneous Q6H Margaretha Seeds, MD      . lip balm (CARMEX) ointment   Topical PRN Frederik Pear, MD   Given at 08/28/19 [phone]  . MEDLINE mouth rinse  15 mL Mouth Rinse q12n4p Rush Farmer, MD   15 mL at 08/29/19 1554  . midodrine (PROAMATINE) tablet 2.5 mg  2.5 mg Oral TID WC Simmons, Brittainy M, PA-C   2.5 mg at 08/30/19 0950  . norepinephrine (LEVOPHED) 16 mg in 230mL premix infusion  0-40 mcg/min Intravenous Titrated Rush Farmer, MD   Stopped at 08/30/19 [phone]  . ondansetron (ZOFRAN) injection 4 mg  4 mg Intravenous Q6H PRN Rush Farmer, MD   4 mg at 08/29/19 1543  . pantoprazole sodium (PROTONIX) 40 mg/20 mL oral suspension 40 mg  40 mg Per Tube Daily Alfonzo Feller, NP   40 mg at 08/30/19 1110  . polyethylene glycol (MIRALAX / GLYCOLAX) packet 17 g  17 g Per Tube Daily PRN Rush Farmer, MD   17 g at 08/27/19 0851  . sennosides (SENOKOT) 8.8 MG/5ML syrup 5 mL  5 mL Per Tube BID PRN Kipp Brood, MD   5 mL at 08/27/19 2136  . Warfarin - Pharmacist Dosing Inpatient   Does not apply q1800 Bertis Ruddy, [place] at 08/29/19 1800     Discharge Medications: Please see discharge summary for a list of discharge medications.  Relevant Imaging Results:  Relevant Lab Results:   Additional Information SSN: [id number]  Archie Endo, LCSW

## 2019-08-30 NOTE — Progress Notes (Signed)
[place] Progress Note Patient Name: Name Seese DOB: 30-Jul-1961 MRN: [id number]   Date of Service  08/30/2019  HPI/Events of Note  INR 7.5  eICU Interventions  Transfuse 3 units FFP, Vitamin K  5 mg iv daily x 3 days        Frederik Pear 08/30/2019, 6:47 AM

## 2019-08-30 NOTE — Progress Notes (Signed)
NAME:  Lance Cook, MRN:  [id number], DOB:  Feb 21, 1961, LOS: 61 ADMISSION DATE:  08/16/2019, CONSULTATION DATE:  08/05/2019 REFERRING MD:  Dr Sherry Ruffing, CHIEF COMPLAINT:  Hyperkalemia    Brief History   58 yo male with PMH significant for Bentall procedure, MVR, Afib, CAD, systolic and diastolic heart failure admitted with bradycardia and found to have severe life-threatening hyperkalemia with AKI and dig toxicity, PCCM consulted for admission and need for urgent iHD. Hospital course included mechanical ventilation 12/18-12/23.  He remains in the ICU for   History of present illness   58 year old man, history of Bentall procedure, MVR, atrial fibrillation, CAD with chronic systolic and diastolic CHF.  Came to the ER with fatigue, weakness and hypotension.  He was found to be profoundly bradycardic with severe left bundle branch block and QRS widening on EKG.  Found to be severely hyperkalemic with new acute renal failure.  He is on digoxin at baseline and his digoxin level was 2.6.  He will be admitted emergently to the ICU for hemodialysis. He was given digibind for dig toxicity.   Significant Hospital Events   12/17 Admitted, urgent HD 12/18 had HD catheter in carotid removed in OR. Returned intubated 12/23 Extubated  Consults:  Nephrology Heart Failure  Procedures:  R IJ HD catheter 12/17 >> Misplaced in carotid, removed in OR 12/18 L IJ HD catheter 12/17 >>  ETI 12/18>>12/23  Significant Diagnostic Tests:  NM cardiac amyloid scan 10/21 >> equivocal for any evidence amyloidosis  ECHO 12/18 > LVEF 30 to 35%. No left ventricular hypertrophy. RV has normal systolic function. The mitral valve has been repaired/replaced. Mild mitral valve regurgitation. Moderate mitral stenosis. Tricuspid valve regurgitation mild-moderate. 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  S/P Bentall.  Moderately elevated pulmonary artery systolic pressure. . A pacer wire is visualized in the RV and RA.  Right atrial pressure of 15 mmHg. History of mechanical aortic and mitral valves. Leaflets not well visualized, could consider fluroscopy of leaflets, but gradients not suggestive of obstruction.  CXR 12/17 > New left internal jugular central line tip in the SVC above the right atrium. No pneumothorax. No other change.  Micro Data:  COVID 12/17 > negative  Blood cultures 12/17 >NGTD   MRSA PCR 12/17 >negative  Urine culture 12/18 >  Negative  Antimicrobials:  Cefazolin 12/18 SCIP   Interim history/subjective:  INR 7.5, overnight provider ordered: 3 FFP, Vitamin K -369 ml I/O with diuresis.   Alert and oriented, denies complaints of abdominal pain   Objective   Blood pressure 111/72, pulse 98, temperature 98.9 F (37.2 C), temperature source Axillary, resp. rate (!) 28, height 5' 2.21" (1.58 m), weight 69.7 kg, SpO2 100 %.        Intake/Output Summary (Last 24 hours) at 08/30/2019 0842 Last data filed at 08/30/2019 0400 Gross per 24 hour  Intake 855.18 ml  Output 1075 ml  Net -219.82 ml   Filed Weights   08/27/19 0453 08/29/19 0402 08/30/19 0426  Weight: 57 kg 67.1 kg 69.7 kg   Examination: General: Deconditioned adult male HEENT: [place]/AT, NG tube, MM pink/moist, PERRL,  Neuro: Alert and oriented, able to follow all commands, globally weak and deconditioned, dysphonia CV: s1s2 regular rate and rhythm, no murmur, rubs, or gallops, + lower extremity edema PULM: symmetric expansion, crackles on the right GI: soft, bowel sounds active in all 4 quadrants, non-tender, non-distended, tolerating TF Extremities: warm/dry, +2 pitting lower extremity edema  Skin: no rashes or lesions  Resolved Hospital Problem list   Coagulopathy with elevated INR Hyperkalemia Digoxin Toxicity  Assessment & Plan:   Acute hypoxemic respiratory failure, improving -Extubated 12/23 Plan: -Pulmonary hygiene: mobilize with PT, cough, IS -Bronchodilators available -Titrate supplemental oxygen for  sats of > 90%, currently on 2 L [place]  Right neck hematoma secondary to a right IJ hemodialysis catheter with arterial punctur -Status post intervention per vascular surgery 08/20/2019 Plan: Continue to monitor wound   Acute encephalopathy, resolved Plan: -Alert and oriented, continue delirium prevention measures  AKI on CKD 3 - S/p CRRT 12/18-12/24  - Etiology of his renal failure likely includes use of diuretics, poor p.o. intake, shock.  Plan -Nephrology following  -1.2 L Urine output   Cardiogenic Shock Acute on Chronic systolic and diastolic CHF (EF 99991111) P: -Midodrine, decreased dose 12/28.  Titration per HF -Stress dose steroids: wean to BID, with plans to discontinue 12/31 -HF and Nephrology discussing diuresis vs volume. Requesting CVC placement for CVP monitoring.  Due to coagulopathy, we are unable to place at this time. Patient is being reversed and will repeat INR at 1530. In the interim, will ultrasound IVC and passive leg raise.   CAD Mechanical mitral valve Chronic atrial fibrillation H/o VT s/p AICD/pacer  - Per HF, LV lead is not currently on due to diaphragmatic stimulation P: -Coumadin dosing per pharmacy, currently being held due elevated INR -Continue PO amio -Goal K>4, Mg>2   DM type 2 Plan -SSI  Anemia Plan -Goal Hgb > 7, no indication for transfusion  Deconditioning -chronic and critical illness Plan -PT/OT following   Hypernatremia --Currently on 300 ml free water q 8 --Increase to 50 ml/hr  Leukocytosis --On steroids --Cx if spikes fever or other evidence of infectious process  Best practice:  Diet: tube feeds per recs Pain/Anxiety/Delirium protocol (if indicated): na VAP protocol (if indicated): na DVT prophylaxis: warfarin, pharmacy to dose. Held due elevated INR  GI prophylaxis: PPI Glucose control: SSI Mobility: PT/OT Code Status: Full Disposition: ICU  Labs   CBC: Recent Labs  Lab 08/26/19 0356 08/26/19 2032  08/27/19 0459 08/28/19 0505 08/29/19 0529 08/30/19 0345  WBC 12.3*  --  12.4* 17.0* 19.4* 26.2*  HGB 7.9* 9.9* 8.2* 8.0* 8.7* 9.1*  HCT 27.9* 29.0* 30.0* 28.5* 30.8* 32.3*  MCV 73.4*  --  75.0* 74.6* 75.3* 75.8*  PLT 147*  --  151 170 229 [id number]    Basic Metabolic Panel: Recent Labs  Lab 08/26/19 0356 08/26/19 2021 08/26/19 2032 08/27/19 0459 08/28/19 0505 08/29/19 0529 08/30/19 0345  NA 135 136 134* 138 143 148* 151*  K 4.1 4.3 4.2 4.5 3.8 4.5 3.7  CL 100 100  --  102 104 106 108  CO2 25 26  --  29 31 31 31   GLUCOSE 115* 152*  --  149* 143* 122* 117*  BUN 48* 59*  --  67* 82* 94* 112*  CREATININE 1.82* 1.82*  --  1.83* 1.93* 2.12* 2.39*  CALCIUM 8.4* 8.6*  --  8.5* 9.1 9.5 9.4  MG 2.5*  --   --  2.6* 2.5* 2.7* 2.8*  PHOS 3.5  --   --  4.2 4.0 4.6 4.2   GFR: Estimated Creatinine Clearance: 29 mL/min (A) (by C-G formula based on SCr of 2.39 mg/dL (H)). Recent Labs  Lab 08/27/19 0459 08/28/19 0505 08/29/19 0529 08/30/19 0345  WBC 12.4* 17.0* 19.4* 26.2*    Liver Function Tests: Recent Labs  Lab 08/26/19 2021 08/27/19 0459 08/28/19 0505 08/29/19  [id number] 08/30/19 0345  AST 11*  --   --   --   --   ALT 18  --   --   --   --   ALKPHOS 66  --   --   --   --   BILITOT 0.2*  --   --   --   --   PROT 5.0*  --   --   --   --   ALBUMIN 2.0* 1.9* 1.9* 2.2* 2.2*    ABG    Component Value Date/Time   PHART 7.424 08/26/2019 2032   PCO2ART 44.7 08/26/2019 2032   PO2ART 96.0 08/26/2019 2032   HCO3 29.2 (H) 08/26/2019 2032   TCO2 31 08/26/2019 2032   ACIDBASEDEF 1.0 01/26/2015 0425   O2SAT 57.8 08/28/2019 0505     Coagulation Profile: Recent Labs  Lab 08/26/19 0356 08/27/19 0459 08/28/19 0505 08/29/19 0529 08/30/19 0345  INR 1.5* 3.5* 5.0* 4.7* 7.5*    CBG: Recent Labs  Lab 08/29/19 0211 08/29/19 0540 08/29/19 1140 08/29/19 1944 08/30/19 0015  GLUCAP 114* 110* 116* 56* 75*   Paulita Fujita, ACNP [facility]  After hours  pager: [phone]

## 2019-08-30 NOTE — Telephone Encounter (Signed)
Lance Cook from [place] is returning phone call.

## 2019-08-30 NOTE — TOC Initial Note (Signed)
Transition of Care [facility]) - Initial/Assessment Note    Patient Details  Name: Lance Cook MRN: [id number] Date of Birth: 1961/08/25  Transition of Care [facility]) CM/SW Contact:    Archie Endo, LCSW Phone Number: 08/30/2019, 10:46 AM  Clinical Narrative:                 CSW met with patient at bedside to complete discussion for SNF placement. Patient unable to use his voice at this time, so he whispered to CSW to contact his wife Lance Cook. CSW spoke with patient's wife Lance Cook to complete discussion. [name] requested placement occur in [place].  CSW will fax patient's information out to facilities for review. CSW will present patient's wife with bed offers once they become available.  Expected Discharge Plan: Skilled Nursing Facility Barriers to Discharge: Continued Medical Work up   Patient Goals and CMS Choice        Expected Discharge Plan and Services Expected Discharge Plan: [place]                                              Prior Living Arrangements/Services   Lives with:: Spouse Patient language and need for interpreter reviewed:: No Do you feel safe going back to the place where you live?: Yes      Need for Family Participation in Patient Care: Yes (Comment) Care giver support system in place?: Yes (comment)   Criminal Activity/Legal Involvement Pertinent to Current Situation/Hospitalization: No - Comment as needed  Activities of Daily Living Home Assistive Devices/Equipment: None ADL Screening (condition at time of admission) Patient's cognitive ability adequate to safely complete daily activities?: Yes Is the patient deaf or have difficulty hearing?: No Does the patient have difficulty seeing, even when wearing glasses/contacts?: No Does the patient have difficulty concentrating, remembering, or making decisions?: No Patient able to express need for assistance with ADLs?: Yes Does the patient have difficulty dressing or bathing?:  No Independently performs ADLs?: Yes (appropriate for developmental age) Does the patient have difficulty walking or climbing stairs?: No Weakness of Legs: Both Weakness of Arms/Hands: Both  Permission Sought/Granted                  Emotional Assessment   Attitude/Demeanor/Rapport: Gracious Affect (typically observed): Accepting Orientation: : Oriented to Self, Oriented to Place, Oriented to  Time, Oriented to Situation Alcohol / Substance Use: Not Applicable Psych Involvement: No (comment)  Admission diagnosis:  Hyperkalemia [E87.5] Bradycardia [R00.1] Central venous catheter in place [Z78.9] Hypotension, unspecified hypotension type [I95.9] Patient Active Problem List   Diagnosis Date Noted  . Respiration abnormal   . Protein-calorie malnutrition, severe 08/23/2019  . Cardiogenic shock ([place])   . Central venous catheter in place   . Hyperkalemia 08/31/2019  . Bradycardia   . Acute on chronic systolic CHF (congestive heart failure), NYHA class 4 ([place]) 06/21/2019  . Acute on chronic diastolic (congestive) heart failure ([place]) 04/01/2019  . Acute on chronic congestive heart failure ([place])   . Anasarca   . Pressure injury of skin 02/14/2019  . Acute on chronic heart failure ([place]) 02/13/2019  . Benign neoplasm of ascending colon   . Benign neoplasm of rectum   . Benign neoplasm of rectosigmoid junction   . Benign neoplasm of sigmoid colon   . DM type 2 with diabetic dyslipidemia ([place]) 04/26/2018  . Lower extremity edema  03/08/2018  . Other fatigue 01/28/2018  . Chronic anticoagulation 01/05/2018  . Insomnia 11/23/2017  . Unilateral vocal cord paralysis 11/30/2016  . Leg swelling 11/28/2016  . S/P MVR (mitral valve replacement)   . Acute on chronic diastolic CHF (congestive heart failure) ([place]) 08/27/2016  . Chronic pain syndrome 07/10/2016  . Chronic diarrhea   . Benign neoplasm of transverse colon   . Scrotal swelling 05/30/2016  . Musculoskeletal pain 01/07/2016   . Personal history of malignant neoplasm of testis 07/12/2015  . Long term current use of opiate analgesic 07/11/2015  . Long term prescription opiate use 07/11/2015  . Opiate use (22.5 MME/day) 07/11/2015  . Opiate dependence ([place]) 07/11/2015  . Encounter for therapeutic drug level monitoring 07/11/2015  . Chronic low back pain (midline) 07/11/2015  . Lumbar facet syndrome 07/11/2015  . Chronic lower extremity pain (Left) 07/11/2015  . Adult BMI 30+ 06/19/2015  . Obesity (BMI 30-39.9) 06/19/2015  . Hypomagnesemia 02/08/2015  . Physical deconditioning 02/08/2015  . Acute encephalopathy 02/05/2015  . Enteritis due to Clostridium difficile 01/30/2015  . Acute respiratory failure with hypoxemia ([place])   . 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 02/63/7858  . Chronic diastolic congestive heart failure ([place])   . Type 2 diabetes mellitus with other specified complication ([place]) 85/10/7739  . 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 Venous insufficiency of both lower extremities 04/02/2014  . Morbid obesity ([place]) 04/02/2014  . Diabetes mellitus ([place]) 03/14/2014  . Personal history of transient ischemic attack (TIA), and cerebral infarction without residual deficits 02/23/2014  . Malignant neoplasm of testis ([place]) 02/23/2014  . H/O TIA (transient ischemic attack) and stroke 02/23/2014  . HTN (hypertension) 02/23/2014  . Lymphadenopathy 02/23/2014  . S/P AVR (aortic valve replacement) 02/23/2014  . Encounter for therapeutic drug monitoring 02/02/2014  . Long term current use of anticoagulant therapy 07/15/2012  . Hypogonadism male 04/08/2012  . 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 Testicular hypofunction 04/08/2012  . Male hypogonadism 04/08/2012  . Other testicular hypofunction 04/08/2012  . History of colonic polyps 12/23/2011  . Testicular cancer ([place])   . TIA (transient ischemic attack)   . OSA (obstructive sleep apnea)   .  Lumbar spinal stenosis (Severe L4-5) 10/08/2011  . ED (erectile dysfunction) of organic origin 10/08/2011  . Lumbar canal stenosis 10/08/2011  . Low back pain 10/08/2011  . Spinal stenosis of lumbar region without neurogenic claudication 10/08/2011  . CAD (coronary artery disease)   . Spondylosis 04/29/2010  . DYSLIPIDEMIA 01/15/2010  . Chronic venous hypertension with ulcer ([place]) 01/15/2010  . Essential hypertension 12/14/2009  . Gout 11/01/2009  . Anemia, iron deficiency 11/01/2009  . MYOCARDIAL INFARCTION, HX OF 11/01/2009  . Atrial fibrillation, chronic 11/01/2009  . PERIPHERAL EDEMA 11/01/2009  . Heart valve replaced by other means 11/01/2009  . Presence of other heart-valve replacement 11/01/2009  . Ascending aortic dissection ([place]) 07/14/2008  . 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 Venous (peripheral) insufficiency 02/03/2005   PCP:  [name], MD Pharmacy:   [facility] - [place], [place] - [address] [place] [address] Phone: [phone] Fax: [phone]  [facility], [place] [address] Phone: [phone] Fax: [phone]  [facility] [place], [place]  [place] [address] Phone: [phone] Fax: [phone]     Social Determinants of Health (SDOH) Interventions    Readmission Risk Interventions Readmission Risk Prevention Plan 06/30/2019  Transportation Screening Complete  Medication Review Press photographer) Complete  PCP or Specialist appointment within 3-5 days of discharge Complete  HRI or [place] Complete  SW Recovery Care/Counseling Consult Complete  [place] Not Applicable  Some recent data might be hidden

## 2019-08-30 NOTE — Progress Notes (Signed)
[facility] NEPHROLOGY PROGRESS NOTE  Assessment/ Plan: Pt is a 58 y.o. yo male CHF, mechanical mitral and aortic valve A. fib, s/p VT arrest, DM, potassium of 8.4 associated with hypotension and AKI on admission.  #Severe hyperkalemia with EKG changes due to use of spironolactone, KCl and AKI: Received urgent dialysis on 12/17, started CRRT on 12/18 for fluid overload- stopped on 12/24.  Resolved.   #Acute on CKD stage III: Baseline creatinine around 1.3-1.6, SCr worseing again.  Not sure if hypovolemic (SNa inc would support this) or worsened CHF.  D/w AHF, will try to pursue CVC, CVP monitoring, might need RRF.  For now would hold further diuretics, will inc FWF to correct his dehydration.  Follow closely.    # Hypernatremia, originally sig hyponatremia.  Needs more free water, inc FWF in NGT today to 300 q4h.  #Acute on Chronic CHF, aortic and mitral valve replacement: S/p UF with CRRT.  UOP ok.  AHF following and as above  #Hypotension/shock: Not on pressors currenlty. Midodrine  + hydrocortisone  #Acute respiratory failure/VDRF: Extubated on 2-4L [place]  Anemia- needs replete iron-  Has allergy listed, Tolerated test dose, getting Fe.   Coagulopathy, reversign today.   Subjective:    SCr up to 2.39, SNa 151, BUN 112  1.2L UOP yesterday  INR 7.5.   Remains on 2L [place]  Objective Vital signs in last 24 hours: Vitals:   08/30/19 0900 08/30/19 0940 08/30/19 1000 08/30/19 1100  BP: 104/62 104/62 123/74   Pulse: 98 (!) 103 (!) 102   Resp: (!) 32 (!) 37 (!) 37   Temp:  98.3 F (36.8 C)  98.6 F (37 C)  TempSrc:  Oral  Axillary  SpO2: 100% 93% 96%   Weight:      Height:       Weight change: 2.6 kg  Intake/Output Summary (Last 24 hours) at 08/30/2019 1139 Last data filed at 08/30/2019 1000 Gross per 24 hour  Intake 1329.8 ml  Output 825 ml  Net 504.8 ml       Labs: Basic Metabolic Panel: Recent Labs  Lab 08/28/19 0505 08/29/19 0529 08/30/19 0345   NA 143 148* 151*  K 3.8 4.5 3.7  CL 104 106 108  CO2 31 31 31   GLUCOSE 143* 122* 117*  BUN 82* 94* 112*  CREATININE 1.93* 2.12* 2.39*  CALCIUM 9.1 9.5 9.4  PHOS 4.0 4.6 4.2   Liver Function Tests: Recent Labs  Lab 08/26/19 2021 08/28/19 0505 08/29/19 0529 08/30/19 0345  AST 11*  --   --   --   ALT 18  --   --   --   ALKPHOS 66  --   --   --   BILITOT 0.2*  --   --   --   PROT 5.0*  --   --   --   ALBUMIN 2.0* 1.9* 2.2* 2.2*   No results for input(s): LIPASE, AMYLASE in the last 168 hours. Recent Labs  Lab 08/26/19 2021  AMMONIA 28   CBC: Recent Labs  Lab 08/26/19 0356 08/27/19 0459 08/28/19 0505 08/29/19 0529 08/30/19 0345  WBC 12.3* 12.4* 17.0* 19.4* 26.2*  HGB 7.9* 8.2* 8.0* 8.7* 9.1*  HCT 27.9* 30.0* 28.5* 30.8* 32.3*  MCV 73.4* 75.0* 74.6* 75.3* 75.8*  PLT 147* 151 170 229 274   Cardiac Enzymes: No results for input(s): CKTOTAL, CKMB, CKMBINDEX, TROPONINI in the last 168 hours. CBG: Recent Labs  Lab 08/29/19 0211 08/29/19 0540 08/29/19 1140  08/29/19 1944 08/30/19 0015  GLUCAP 114* 110* 116* 124* 132*    Iron Studies:  No results for input(s): IRON, TIBC, TRANSFERRIN, FERRITIN in the last 72 hours. Studies/Results: DG Abd 1 View  Result Date: 08/29/2019 CLINICAL DATA:  58 y.o. yo male with h/o CHF (EF 30%), mechanical mitral and aortic valve A. fib, s/p VT arrest, ICD, DM, Bell's palsy, CVA came to the ER 08/08/2019 with fatigue, weakness and hypotension; potassium of 8.4 associated with hypotens.*comment was truncated* EXAM: ABDOMEN - 1 VIEW COMPARISON:  Abdominal radiograph 08/28/2019 FINDINGS: NG tube with tip in stomach. No dilated large or small bowel. Gas in the rectum. Mild basilar atelectasis. Cardiomegaly. IMPRESSION: 1. No interval change. 2. No bowel obstruction. Electronically Signed   By: Suzy Bouchard M.D.   On: 08/29/2019 17:52   DG Abd 1 View  Result Date: 08/28/2019 CLINICAL DATA:  NG tube insertion. EXAM: ABDOMEN - 1 VIEW  4:39 p.m. COMPARISON:  08/28/2019 at 11:54 a.m. FINDINGS: The NG tube has been advanced and the tip is now in the fundus of the stomach. No dilated bowel. Haziness at both lung bases probably represent small effusions. Cardiomegaly. Mitral valve prosthesis. IMPRESSION: Benign-appearing abdomen. The tip of the nasogastric tube is in the fundus of the stomach. Haziness at both lung bases likely represent small effusions. Electronically Signed   By: Lorriane Shire M.D.   On: 08/28/2019 16:57   DG Abd Portable 1V  Result Date: 08/28/2019 CLINICAL DATA:  NG tube placement EXAM: PORTABLE ABDOMEN - 1 VIEW COMPARISON:  08/24/2019 FINDINGS: 1154 hours. Portable upright view of the lower chest and upper abdomen shows the tip of an NG tube at the GE junction with proximal side port in the distal esophagus. Cardiopericardial silhouette is enlarged. There is bibasilar airspace disease better characterized on chest x-ray earlier today. Bowel gas pattern is nonspecific. IMPRESSION: NG tube tip is in the distal esophagus at the esophagogastric junction. Electronically Signed   By: Misty Stanley M.D.   On: 08/28/2019 12:12    Medications: Infusions: . sodium chloride 500 mL (08/30/19 1106)  . feeding supplement (JEVITY 1.5 CAL/FIBER) 1,000 mL (08/30/19 1036)  . ferric gluconate (FERRLECIT/NULECIT) IV 125 mg (08/30/19 1108)  . norepinephrine (LEVOPHED) Adult infusion Stopped (08/30/19 0929)    Scheduled Medications: . sodium chloride   Intravenous Once  . albuterol  2.5 mg Nebulization TID  . amiodarone  200 mg Per Tube BID  . chlorhexidine  15 mL Mouth Rinse BID  . Chlorhexidine Gluconate Cloth  6 each Topical Q0600  . darbepoetin (ARANESP) injection - NON-DIALYSIS  100 mcg Subcutaneous Q Wed-1800  . feeding supplement (PRO-STAT SUGAR FREE 64)  30 mL Per Tube BID  . free water  50 mL Per Tube Q1H  . hydrocortisone sodium succinate  25 mg Intravenous Q12H  . hydroxypropyl methylcellulose / hypromellose  1  drop Both Eyes TID  . insulin aspart  2-6 Units Subcutaneous Q6H  . mouth rinse  15 mL Mouth Rinse q12n4p  . midodrine  2.5 mg Oral TID WC  . pantoprazole sodium  40 mg Per Tube Daily  . Warfarin - Pharmacist Dosing Inpatient   Does not apply q1800    have reviewed scheduled and prn medications.  Physical Exam: General: awake, alert Heart:RRR, s1s2 nl Lungs: Coarse breath sound bilateral Abdomen:soft,  non-distended Extremities: LE edema + pitting to dep areas Dialysis Access: none present  Rae Sutcliffe B Yanira Tolsma 08/30/2019,11:39 AM  LOS: 12 days

## 2019-08-30 NOTE — Progress Notes (Addendum)
Advanced Heart Failure Rounding Note   Subjective:    CVVHD stopped 12/24. Trialysis cath removed per nephrology. Transitioned back to IV lasix but SCr continues to rise 1.83>>1.93>>2.12>>2.39. BUN 112.  Hypernatremic, NA 151. Diuretics now on hold.   INR supratherapeutic at 7.5 this am. Received low  dose vitamin K and FFP. Repeat INR 5.9. Hgb stable at 9.1.   Looks weak. Communication difficult w/ NGT.    Objective:   Weight Range:  Vital Signs:   Temp:  [97.9 F (36.6 C)-98.9 F (37.2 C)] 98.6 F (37 C) (12/29 1100) Pulse Rate:  [10-107] 103 (12/29 1417) Resp:  [27-37] 28 (12/29 1417) BP: (80-127)/(44-96) 118/73 (12/29 1417) SpO2:  [91 %-100 %] 93 % (12/29 1417) Weight:  [69.7 kg] 69.7 kg (12/29 0426) Last BM Date: 08/29/19  Weight change: Filed Weights   08/27/19 0453 08/29/19 0402 08/30/19 0426  Weight: 57 kg 67.1 kg 69.7 kg    Intake/Output:   Intake/Output Summary (Last 24 hours) at 08/30/2019 1430 Last data filed at 08/30/2019 1300 Gross per 24 hour  Intake 1611.03 ml  Output 675 ml  Net 936.03 ml     Physical Exam: General:  Chronically-ill appearing/ thin WM HEENT: normal, NGT Neck: supple. Elevated JVD. R neck wound Carotids 2+ bilat; no bruits. No lymphadenopathy or thryomegaly appreciated. Cor: PMI nondisplaced. Irregular rhythm, tachy rate. + mechanical valve sounds Lungs: CTAB anteriorly  Abdomen: soft, nontender, mildly distended. No hepatosplenomegaly. No bruits or masses. Good bowel sounds. Extremities: no cyanosis, clubbing, rash, trace bilateral LE edema w/ chronic venous stasis dermatitis  Neuro: alert & orientedx3, cranial nerves grossly intact. moves all 4 extremities w/o difficulty. Affect flat   Telemetry: A fib low 100s Personally reviewed   Labs: Basic Metabolic Panel: Recent Labs  Lab 08/26/19 0356 08/26/19 2021 08/26/19 2032 08/27/19 0459 08/28/19 0505 08/29/19 0529 08/30/19 0345  NA 135 136 134* 138 143 148* 151*   K 4.1 4.3 4.2 4.5 3.8 4.5 3.7  CL 100 100  --  102 104 106 108  CO2 25 26  --  29 31 31 31   GLUCOSE 115* 152*  --  149* 143* 122* 117*  BUN 48* 59*  --  67* 82* 94* 112*  CREATININE 1.82* 1.82*  --  1.83* 1.93* 2.12* 2.39*  CALCIUM 8.4* 8.6*  --  8.5* 9.1 9.5 9.4  MG 2.5*  --   --  2.6* 2.5* 2.7* 2.8*  PHOS 3.5  --   --  4.2 4.0 4.6 4.2    Liver Function Tests: Recent Labs  Lab 08/26/19 2021 08/27/19 0459 08/28/19 0505 08/29/19 0529 08/30/19 0345  AST 11*  --   --   --   --   ALT 18  --   --   --   --   ALKPHOS 66  --   --   --   --   BILITOT 0.2*  --   --   --   --   PROT 5.0*  --   --   --   --   ALBUMIN 2.0* 1.9* 1.9* 2.2* 2.2*   No results for input(s): LIPASE, AMYLASE in the last 168 hours. Recent Labs  Lab 08/26/19 2021  AMMONIA 28    CBC: Recent Labs  Lab 08/26/19 0356 08/26/19 2032 08/27/19 0459 08/28/19 0505 08/29/19 0529 08/30/19 0345  WBC 12.3*  --  12.4* 17.0* 19.4* 26.2*  HGB 7.9* 9.9* 8.2* 8.0* 8.7* 9.1*  HCT 27.9* 29.0* 30.0*  28.5* 30.8* 32.3*  MCV 73.4*  --  75.0* 74.6* 75.3* 75.8*  PLT 147*  --  151 170 229 274    Cardiac Enzymes: No results for input(s): CKTOTAL, CKMB, CKMBINDEX, TROPONINI in the last 168 hours.  BNP: BNP (last 3 results) Recent Labs    07/19/19 0956 08/02/19 0957 08/05/2019 1424  BNP 298.7* 167.3* 106.1*    ProBNP (last 3 results) No results for input(s): PROBNP in the last 8760 hours.    Other results:  Imaging: DG Abd 1 View  Result Date: 08/29/2019 CLINICAL DATA:  58 y.o. yo male with h/o CHF (EF 30%), mechanical mitral and aortic valve A. fib, s/p VT arrest, ICD, DM, Bell's palsy, CVA came to the ER 08/13/2019 with fatigue, weakness and hypotension; potassium of 8.4 associated with hypotens.*comment was truncated* EXAM: ABDOMEN - 1 VIEW COMPARISON:  Abdominal radiograph 08/28/2019 FINDINGS: NG tube with tip in stomach. No dilated large or small bowel. Gas in the rectum. Mild basilar atelectasis.  Cardiomegaly. IMPRESSION: 1. No interval change. 2. No bowel obstruction. Electronically Signed   By: Suzy Bouchard M.D.   On: 08/29/2019 17:52   DG Abd 1 View  Result Date: 08/28/2019 CLINICAL DATA:  NG tube insertion. EXAM: ABDOMEN - 1 VIEW 4:39 p.m. COMPARISON:  08/28/2019 at 11:54 a.m. FINDINGS: The NG tube has been advanced and the tip is now in the fundus of the stomach. No dilated bowel. Haziness at both lung bases probably represent small effusions. Cardiomegaly. Mitral valve prosthesis. IMPRESSION: Benign-appearing abdomen. The tip of the nasogastric tube is in the fundus of the stomach. Haziness at both lung bases likely represent small effusions. Electronically Signed   By: Lorriane Shire M.D.   On: 08/28/2019 16:57     Medications:     Scheduled Medications: . sodium chloride   Intravenous Once  . albuterol  2.5 mg Nebulization TID  . amiodarone  200 mg Per Tube BID  . chlorhexidine  15 mL Mouth Rinse BID  . Chlorhexidine Gluconate Cloth  6 each Topical Q0600  . darbepoetin (ARANESP) injection - NON-DIALYSIS  100 mcg Subcutaneous Q Wed-1800  . feeding supplement (PRO-STAT SUGAR FREE 64)  30 mL Per Tube BID  . free water  50 mL Per Tube Q1H  . hydrocortisone sodium succinate  25 mg Intravenous Q12H  . hydroxypropyl methylcellulose / hypromellose  1 drop Both Eyes TID  . insulin aspart  2-6 Units Subcutaneous Q6H  . mouth rinse  15 mL Mouth Rinse q12n4p  . midodrine  2.5 mg Oral TID WC  . pantoprazole sodium  40 mg Per Tube Daily  . Warfarin - Pharmacist Dosing Inpatient   Does not apply q1800    Infusions: . sodium chloride 10 mL/hr at 08/30/19 1300  . feeding supplement (JEVITY 1.5 CAL/FIBER) 20 mL/hr at 08/30/19 1318  . ferric gluconate (FERRLECIT/NULECIT) IV Stopped (08/30/19 1208)  . norepinephrine (LEVOPHED) Adult infusion Stopped (08/30/19 0929)    PRN Medications: sodium chloride, bisacodyl, heparin, lip balm, ondansetron (ZOFRAN) IV, polyethylene glycol,  sennosides   Assessment/Plan:   1. AKI on CKD stage 3: Baseline creatinine 1.4-1.6 Creatinine up to 2.7 at admission with marked hyperkalemia and hyponatremia in setting of use of torsemide, metolazone, and spironolactone at home.  He had urgent HD with improvement.  - CVVHD stopped 12/24. Creatinine trending back up, now at 2.39 despite IV diuretics. BUN 112. Na 151. Diuretics now on hold. Suspect cardiorenal syndrome. May need inotropic support.  2. Acute on chronic systolic  CHF/cardiogenic shock: EF 30-35% on last echo.  [employer] CRT-D, LV not on due to diaphragmatic stimulation.  Hypotension in setting of AKI/hyperkalemia. - Volume overload much improved with CVVHD.  -still volume up w/ elevated JVD. Diuretics on hold for worsening renal function - may benefit from CVC for CVP and co-ox monitoring to better gauge volume status/output. May need inotrope. Plan CVC w/ INR allows.  Off NE. On midodrine 2.5 tid today. Will try to wean off.  - Will not restart HF meds yet 3. VT: Patient has [employer] CRT-D device, LV lead off due to diaphragmatic pacing.  Patient has has runs of VT, treated w/ amiodarone gtt.  - VT quiescent - Continue amio at 200 po bid - Keep K > 4.0 and Mg > 2.0 4. Digoxin toxicity: Severely elevated digoxin level, has had digibind and HD.  - bradycardia improved 5. CAD: Last cath in 2016 with patent native coronaries, probably occluded SVG-RCA.  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  -heparin/warfarin on hold w/ supratherapeutic INR. 7.5. Received low  dose vitamin K and FFP. Repeat INR 5.9. 7. Atrial fibrillation: Chronic.   - rate in the 110s currently. ? If dry. Holding diuretics today. - Continue po amio  8. Acute hypoxic respiratory failure -Extubated on 2 liters [place]  9. Ab pain - improving. KUB ok 10. Debility, severe - PT/OT to see. Attempt to get OOB to chair  11. Hypernatremia: Na 151. ? If 2/2 hypovolemia. Diuretics  on hold.  - Free water per nephrology  12. Leukocytosis: WBC 26. Afebrile. Suspect 2/2 hydrocortisone for shock.   Length of Stay: [place] PA-C 08/30/2019, 2:30 PM  Advanced Heart Failure Team Pager [phone] (M-F; [place])  Please contact [facility] for night-coverage after hours (4p -7a ) and weekends on [email or web address]  Patient seen with PA, agree with the above note.   Very weak, hard for him to talk with NGT in place.   Remains in atrial fibrillation rate 100s-110s.  BUN/creatinine climbing again, dialysis catheter was removed.  No access for CVP.   General: Chronically ill-appearing.  Neck: JVP 14 cm, no thyromegaly or thyroid nodule.  Lungs: Decreased at bases.  CV: Nondisplaced PMI.  Heart irregular S1/S2 with mechanical S1S2, no S3/S4, 2/6 HSM LLSB.  1+ edema to knees.  Abdomen: Soft, nontender, no hepatosplenomegaly, no distention.  Skin: venous stasis changes lower legs.  Neurologic: Alert and oriented x 3.  Psych: Normal affect. Extremities: No clubbing or cyanosis.  HEENT: Normal.   Worsening volume overload on exam with weight up 6 lbs but BUN/creatinine rising again (BUN > 100).  I am concerned that he is going to require CVVH again.  - Discussed with nephrology, will hold diuretics today to see if there is any renal equilibration, but think that tomorrow we will need to challenge with high dose diuretics and may end up needing CVVH again.  - Continue midodrine 2.5 mg tid.  - Would like central access again to follow CVP/co-ox.  Slowly reversing coagulopathy (need to be careful with 2 mechanical valves).  Discussed with Dr. Loanne Drilling.   Atrial fibrillation is chronic, rate around 100 on amiodarone 200 mg bid.  On amiodarone at home for VT.   Wean hydrocortisone to 25 mg bid.   WBCs up to 26 but afebrile.  May be due to steroids but need to monitor closely for infection. Chest XR in am.   Very weak, needs aggressive  PT.   Loralie Champagne 08/30/2019 5:21 PM

## 2019-08-31 ENCOUNTER — Inpatient Hospital Stay (HOSPITAL_COMMUNITY): Payer: Medicare HMO

## 2019-08-31 LAB — BASIC METABOLIC PANEL
Anion gap: 10 (ref 5–15)
BUN: 125 mg/dL — ABNORMAL HIGH (ref 6–20)
CO2: 32 mmol/L (ref 22–32)
Calcium: 8.9 mg/dL (ref 8.9–10.3)
Chloride: 112 mmol/L — ABNORMAL HIGH (ref 98–111)
Creatinine, Ser: 2.6 mg/dL — ABNORMAL HIGH (ref 0.61–1.24)
GFR calc Af Amer: 30 mL/min — ABNORMAL LOW (ref >60)
GFR calc non Af Amer: 26 mL/min — ABNORMAL LOW (ref >60)
Glucose, Bld: 129 mg/dL — ABNORMAL HIGH (ref 70–99)
Potassium: 3.2 mmol/L — ABNORMAL LOW (ref 3.5–5.1)
Sodium: 154 mmol/L — ABNORMAL HIGH (ref 135–145)

## 2019-08-31 LAB — PREPARE FRESH FROZEN PLASMA

## 2019-08-31 LAB — POCT I-STAT 7, (LYTES, BLD GAS, ICA,H+H)
Acid-Base Excess: 9 mmol/L — ABNORMAL HIGH (ref 0.0–2.0)
Bicarbonate: 35.1 mmol/L — ABNORMAL HIGH (ref 20.0–28.0)
Calcium, Ion: 1.31 mmol/L (ref 1.15–1.40)
HCT: 29 % — ABNORMAL LOW (ref 39.0–52.0)
Hemoglobin: 9.9 g/dL — ABNORMAL LOW (ref 13.0–17.0)
O2 Saturation: 98 %
Patient temperature: 97.6
Potassium: 4 mmol/L (ref 3.5–5.1)
Sodium: 150 mmol/L — ABNORMAL HIGH (ref 135–145)
TCO2: 37 mmol/L — ABNORMAL HIGH (ref 22–32)
pCO2 arterial: 52 mmHg — ABNORMAL HIGH (ref 32.0–48.0)
pH, Arterial: 7.435 (ref 7.350–7.450)
pO2, Arterial: 95 mmHg (ref 83.0–108.0)

## 2019-08-31 LAB — BPAM FFP
Blood Product Expiration Date: 202101032359
Blood Product Expiration Date: 202101032359
Blood Product Expiration Date: 202101032359
ISSUE DATE / TIME: 202012290806
ISSUE DATE / TIME: 202012291616
ISSUE DATE / TIME: 202012291616
Unit Type and Rh: 7300
Unit Type and Rh: 7300
Unit Type and Rh: 7300

## 2019-08-31 LAB — URINALYSIS, ROUTINE W REFLEX MICROSCOPIC
Bilirubin Urine: NEGATIVE
Glucose, UA: NEGATIVE mg/dL
Ketones, ur: NEGATIVE mg/dL
Nitrite: NEGATIVE
Protein, ur: 30 mg/dL — AB
Specific Gravity, Urine: 1.014 (ref 1.005–1.030)
WBC, UA: >50 WBC/hpf — ABNORMAL HIGH (ref 0–5)
pH: 9 — ABNORMAL HIGH (ref 5.0–8.0)

## 2019-08-31 LAB — COOXEMETRY PANEL
Carboxyhemoglobin: 2.6 % — ABNORMAL HIGH (ref 0.5–1.5)
Methemoglobin: 1.4 % (ref 0.0–1.5)
O2 Saturation: 65.1 %
Total hemoglobin: 8.8 g/dL — ABNORMAL LOW (ref 12.0–16.0)

## 2019-08-31 LAB — RENAL FUNCTION PANEL
Albumin: 2.2 g/dL — ABNORMAL LOW (ref 3.5–5.0)
Anion gap: 11 (ref 5–15)
BUN: 118 mg/dL — ABNORMAL HIGH (ref 6–20)
CO2: 31 mmol/L (ref 22–32)
Calcium: 9.2 mg/dL (ref 8.9–10.3)
Chloride: 109 mmol/L (ref 98–111)
Creatinine, Ser: 2.55 mg/dL — ABNORMAL HIGH (ref 0.61–1.24)
GFR calc Af Amer: 31 mL/min — ABNORMAL LOW (ref >60)
GFR calc non Af Amer: 27 mL/min — ABNORMAL LOW (ref >60)
Glucose, Bld: 113 mg/dL — ABNORMAL HIGH (ref 70–99)
Phosphorus: 5.5 mg/dL — ABNORMAL HIGH (ref 2.5–4.6)
Potassium: 3.9 mmol/L (ref 3.5–5.1)
Sodium: 151 mmol/L — ABNORMAL HIGH (ref 135–145)

## 2019-08-31 LAB — CBC
HCT: 31.7 % — ABNORMAL LOW (ref 39.0–52.0)
Hemoglobin: 8.8 g/dL — ABNORMAL LOW (ref 13.0–17.0)
MCH: 21.8 pg — ABNORMAL LOW (ref 26.0–34.0)
MCHC: 27.8 g/dL — ABNORMAL LOW (ref 30.0–36.0)
MCV: 78.7 fL — ABNORMAL LOW (ref 80.0–100.0)
Platelets: 244 10*3/uL (ref 150–400)
RBC: 4.03 MIL/uL — ABNORMAL LOW (ref 4.22–5.81)
RDW: 24.1 % — ABNORMAL HIGH (ref 11.5–15.5)
WBC: 22 10*3/uL — ABNORMAL HIGH (ref 4.0–10.5)
nRBC: 3.7 % — ABNORMAL HIGH (ref 0.0–0.2)

## 2019-08-31 LAB — GLUCOSE, CAPILLARY
Glucose-Capillary: 109 mg/dL — ABNORMAL HIGH (ref 70–99)
Glucose-Capillary: 108 mg/dL — ABNORMAL HIGH (ref 70–99)
Glucose-Capillary: 105 mg/dL — ABNORMAL HIGH (ref 70–99)
Glucose-Capillary: 114 mg/dL — ABNORMAL HIGH (ref 70–99)
Glucose-Capillary: 136 mg/dL — ABNORMAL HIGH (ref 70–99)

## 2019-08-31 LAB — HEPARIN LEVEL (UNFRACTIONATED): Heparin Unfractionated: <0.1 IU/mL — ABNORMAL LOW (ref 0.30–0.70)

## 2019-08-31 LAB — PROTIME-INR
INR: 2.2 — ABNORMAL HIGH (ref 0.8–1.2)
Prothrombin Time: 24.6 seconds — ABNORMAL HIGH (ref 11.4–15.2)

## 2019-08-31 LAB — PROCALCITONIN: Procalcitonin: 0.4 ng/mL

## 2019-08-31 LAB — MAGNESIUM: Magnesium: 2.9 mg/dL — ABNORMAL HIGH (ref 1.7–2.4)

## 2019-08-31 LAB — MRSA PCR SCREENING: MRSA by PCR: NEGATIVE

## 2019-08-31 MED ORDER — HEPARIN (PORCINE) 25000 UT/250ML-% IV SOLN
1700.0000 [IU]/h | INTRAVENOUS | Status: DC
  Administered 2019-08-31: 950 [IU]/h via INTRAVENOUS
  Administered 2019-09-01: 1400 [IU]/h via INTRAVENOUS
  Administered 2019-09-02 (×2): 1700 [IU]/h via INTRAVENOUS
  Filled 2019-08-31 (×4): qty 250

## 2019-08-31 MED ORDER — FUROSEMIDE 10 MG/ML IJ SOLN
100.0000 mg | Freq: Once | INTRAVENOUS | Status: AC
  Administered 2019-08-31: 100 mg via INTRAVENOUS
  Filled 2019-08-31: qty 10

## 2019-08-31 MED ORDER — SODIUM CHLORIDE 0.9 % IV SOLN
1.0000 g | INTRAVENOUS | Status: DC
  Administered 2019-08-31 – 2019-09-03 (×4): 1 g via INTRAVENOUS
  Filled 2019-08-31 (×6): qty 1

## 2019-08-31 MED ORDER — FUROSEMIDE 10 MG/ML IJ SOLN
120.0000 mg | Freq: Three times a day (TID) | INTRAVENOUS | Status: DC
  Administered 2019-08-31 – 2019-09-01 (×3): 120 mg via INTRAVENOUS
  Filled 2019-08-31 (×3): qty 10
  Filled 2019-08-31 (×2): qty 12

## 2019-08-31 MED ORDER — ALBUTEROL SULFATE (2.5 MG/3ML) 0.083% IN NEBU: 2.5000 mg | INHALATION_SOLUTION | RESPIRATORY_TRACT | Status: DC | PRN

## 2019-08-31 MED ORDER — FUROSEMIDE 10 MG/ML IJ SOLN
100.0000 mg | Freq: Once | INTRAVENOUS | Status: DC
  Filled 2019-08-31: qty 10

## 2019-08-31 MED ORDER — VANCOMYCIN HCL 1250 MG/250ML IV SOLN
1250.0000 mg | Freq: Once | INTRAVENOUS | Status: DC
  Filled 2019-08-31: qty 250

## 2019-08-31 MED ORDER — ACETAMINOPHEN 325 MG PO TABS
650.0000 mg | ORAL_TABLET | Freq: Four times a day (QID) | ORAL | Status: DC | PRN
  Administered 2019-08-31 – 2019-09-04 (×6): 650 mg via ORAL
  Filled 2019-08-31 (×6): qty 2

## 2019-08-31 NOTE — Progress Notes (Signed)
Patient on bipap and still tachypneic, breathing 40-45 times a minute. Sounds congested. Mentation intact, follows commands. ABG WNL. Called [facility] to request the ground team come and take a look at him.   Lance Cook

## 2019-08-31 NOTE — Progress Notes (Addendum)
Advanced Heart Failure Rounding Note   Subjective:    CVVHD stopped 12/24. Trialysis cath removed per nephrology. Transitioned back to IV lasix but Held yesterday due to rising SCr and BUN.   Now w/ progressive respiratory failure and placed on BIPAP. Pt refusing Intubation. Partial CODE. IV Lasix resumed today 120 mg q8H with plans to resume CRRT if poor response. Na 151. SCr up from 2.3>>2.5. BUN 118. Overall, has put out 1.1L in urine today.   INR improved today at 2.2 (7.5 yesterday>>Received low dose vitamin K and FFP)   Objective:   Weight Range:  Vital Signs:   Temp:  [97.6 F (36.4 C)-100.5 F (38.1 C)] 99.4 F (37.4 C) (12/30 1100) Pulse Rate:  [92-115] 100 (12/30 1200) Resp:  [22-46] 23 (12/30 1200) BP: (92-128)/(57-76) 93/61 (12/30 1200) SpO2:  [94 %-100 %] 98 % (12/30 1200) FiO2 (%):  [30 %-40 %] 30 % (12/30 1200) Weight:  [61 kg] 61 kg (12/30 0500) Last BM Date: 08/31/19  Weight change: Filed Weights   08/29/19 0402 08/30/19 0426 08/31/19 0500  Weight: 67.1 kg 69.7 kg 61 kg    Intake/Output:   Intake/Output Summary (Last 24 hours) at 08/31/2019 1419 Last data filed at 08/31/2019 1200 Gross per 24 hour  Intake 1179.85 ml  Output 1480 ml  Net -300.15 ml     Physical Exam: General:  Critically ill, frail WM HEENT: normal Neck: supple. Elevated JVD to ear. R neck wound Carotids 2+ bilat; no bruits. No lymphadenopathy or thryomegaly appreciated. Cor: PMI nondisplaced. Irregular rhythm, tachy rate. + mechanical valve sounds Lungs: on BIPAP. CTAB anteriorly  Abdomen: soft, nontender, mildly distended. No hepatosplenomegaly. No bruits or masses. Good bowel sounds. Extremities: no cyanosis, clubbing, rash, trace bilateral LE edema w/ chronic venous stasis dermatitis  Neuro: alert & orientedx3, cranial nerves grossly intact. moves all 4 extremities w/o difficulty. Affect flat   Telemetry: A fib low 90s- low 100s Personally reviewed   Labs: Basic  Metabolic Panel: Recent Labs  Lab 08/27/19 0459 08/28/19 0505 08/29/19 0529 08/30/19 0345 08/31/19 0416 08/31/19 0447  NA 138 143 148* 151* 150* 151*  K 4.5 3.8 4.5 3.7 4.0 3.9  CL 102 104 106 108  --  109  CO2 29 31 31 31   --  31  GLUCOSE 149* 143* 122* 117*  --  113*  BUN 67* 82* 94* 112*  --  118*  CREATININE 1.83* 1.93* 2.12* 2.39*  --  2.55*  CALCIUM 8.5* 9.1 9.5 9.4  --  9.2  MG 2.6* 2.5* 2.7* 2.8*  --  2.9*  PHOS 4.2 4.0 4.6 4.2  --  5.5*    Liver Function Tests: Recent Labs  Lab 08/26/19 2021 08/27/19 0459 08/28/19 0505 08/29/19 0529 08/30/19 0345 08/31/19 0447  AST 11*  --   --   --   --   --   ALT 18  --   --   --   --   --   ALKPHOS 66  --   --   --   --   --   BILITOT 0.2*  --   --   --   --   --   PROT 5.0*  --   --   --   --   --   ALBUMIN 2.0* 1.9* 1.9* 2.2* 2.2* 2.2*   No results for input(s): LIPASE, AMYLASE in the last 168 hours. Recent Labs  Lab 08/26/19 2021  AMMONIA 28  CBC: Recent Labs  Lab 08/27/19 0459 08/28/19 0505 08/29/19 0529 08/30/19 0345 08/31/19 0416 08/31/19 0447  WBC 12.4* 17.0* 19.4* 26.2*  --  22.0*  HGB 8.2* 8.0* 8.7* 9.1* 9.9* 8.8*  HCT 30.0* 28.5* 30.8* 32.3* 29.0* 31.7*  MCV 75.0* 74.6* 75.3* 75.8*  --  78.7*  PLT 151 170 229 274  --  244    Cardiac Enzymes: No results for input(s): CKTOTAL, CKMB, CKMBINDEX, TROPONINI in the last 168 hours.  BNP: BNP (last 3 results) Recent Labs    07/19/19 0956 08/02/19 0957 08/20/2019 1424  BNP 298.7* 167.3* 106.1*    ProBNP (last 3 results) No results for input(s): PROBNP in the last 8760 hours.    Other results:  Imaging: DG Abd 1 View  Result Date: 08/29/2019 CLINICAL DATA:  58 y.o. yo male with h/o CHF (EF 30%), mechanical mitral and aortic valve A. fib, s/p VT arrest, ICD, DM, Bell's palsy, CVA came to the ER 08/12/2019 with fatigue, weakness and hypotension; potassium of 8.4 associated with hypotens.*comment was truncated* EXAM: ABDOMEN - 1 VIEW  COMPARISON:  Abdominal radiograph 08/28/2019 FINDINGS: NG tube with tip in stomach. No dilated large or small bowel. Gas in the rectum. Mild basilar atelectasis. Cardiomegaly. IMPRESSION: 1. No interval change. 2. No bowel obstruction. Electronically Signed   By: Suzy Bouchard M.D.   On: 08/29/2019 17:52   DG CHEST PORT 1 VIEW  Result Date: 08/31/2019 CLINICAL DATA:  Tachypnea EXAM: PORTABLE CHEST 1 VIEW COMPARISON:  Three days ago FINDINGS: Enteric tube with tip over the stomach. Biventricular pacer from the left with leads in unremarkable position. The left IJ line has been removed. Prior median sternotomy with mitral valve replacement low volume chest with asymmetric haziness on the right. No generalized Kerley lines or pneumothorax. IMPRESSION: 1. Mild improvement in aeration. 2. Asymmetric airspace opacity greater on the right. This could be pneumonia in the setting of rising white count. Electronically Signed   By: Monte Fantasia M.D.   On: 08/31/2019 08:01     Medications:     Scheduled Medications: . sodium chloride   Intravenous Once  . sodium chloride   Intravenous Once  . albuterol  2.5 mg Nebulization TID  . amiodarone  200 mg Per Tube BID  . chlorhexidine  15 mL Mouth Rinse BID  . Chlorhexidine Gluconate Cloth  6 each Topical Q0600  . darbepoetin (ARANESP) injection - NON-DIALYSIS  100 mcg Subcutaneous Q Wed-1800  . feeding supplement (PRO-STAT SUGAR FREE 64)  30 mL Per Tube BID  . free water  50 mL Per Tube Q1H  . hydrocortisone sodium succinate  25 mg Intravenous Q12H  . hydroxypropyl methylcellulose / hypromellose  1 drop Both Eyes TID  . insulin aspart  2-6 Units Subcutaneous Q6H  . mouth rinse  15 mL Mouth Rinse q12n4p  . midodrine  2.5 mg Oral TID WC  . pantoprazole sodium  40 mg Per Tube Daily    Infusions: . sodium chloride 10 mL/hr at 08/31/19 1200  . ceFEPime (MAXIPIME) IV 1 g (08/31/19 1228)  . feeding supplement (JEVITY 1.5 CAL/FIBER) Stopped (08/31/19  0415)  . ferric gluconate (FERRLECIT/NULECIT) IV Stopped (08/31/19 1132)  . furosemide 120 mg (08/31/19 1341)  . heparin 950 Units/hr (08/31/19 1200)  . norepinephrine (LEVOPHED) Adult infusion Stopped (08/30/19 0929)  . vancomycin      PRN Medications: sodium chloride, acetaminophen, bisacodyl, heparin, lip balm, ondansetron (ZOFRAN) IV, polyethylene glycol, sennosides   Assessment/Plan:   1. AKI on  CKD stage 3: Baseline creatinine 1.4-1.6 Creatinine up to 2.7 at admission with marked hyperkalemia and hyponatremia in setting of use of torsemide, metolazone, and spironolactone at home.  He had urgent HD with improvement.  - CVVHD stopped 12/24. Creatinine trending back up, now at 2.5 despite IV diuretics. BUN 118. Na 151. Suspect cardiorenal syndrome. Will likely need to resume CVVHD.  Nephrology following.  2. Acute on chronic systolic CHF/cardiogenic shock: EF 30-35% on last echo.  [employer] CRT-D, LV not on due to diaphragmatic stimulation.  Hypotension in setting of AKI/hyperkalemia. - remains volume overloaded. Diuretics resumed today 120 mg q8H but may need to resume CVVHD if poor response. 1.1L out thus far.  - Off NE. On midodrine 2.5 tid for BP support.  - Will not restart HF meds yet 3. VT: Patient has [employer] CRT-D device, LV lead off due to diaphragmatic pacing.  Patient has has runs of VT, treated w/ amiodarone gtt.  - VT quiescent - Continue amio at 200 po bid - Keep K > 4.0 and Mg > 2.0 4. Digoxin toxicity: Severely elevated digoxin level, has had digibind and HD.  - bradycardia improved 5. CAD: Last cath in 2016 with patent native coronaries, probably occluded SVG-RCA.  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  -on IV heparin. INR 2.2  7. Atrial fibrillation: Chronic.   - rate in the 110s currently.  - Continue po amio  8. Acute hypoxic respiratory failure -Extubated but now on BiPAP. Pt refusing re-intubation  -continue IV  diuretics for CHF - abx for ? PNA => cefepime 9. Ab pain - improving. KUB ok 10. Debility, severe -PT on hold given current status 11. Hypernatremia: Na 151.  - managment per nephrology  12. Leukocytosis: WBC 22 but also on hydrocortisone for shock. Spiked temp at 100.5. CXR shows possible PNA. PTC 0.40. On cefepime. Blood cultures pending.   Length of Stay: [phone] Ladoris Gene 08/31/2019, 2:19 PM  Advanced Heart Failure Team Pager [phone] (M-F; 7a - 4p)  Please contact [facility] for night-coverage after hours (4p -7a ) and weekends on [email or web address]  Patient seen with PA, agree with the above note.   He is on Bipap this afternoon, not very interactive.  He is now DNI.  Lasix 120 mg IV every 8 hrs started, had 1400 cc UOP with first dose per nurse.  BUN/creatinine still rising.    Getting cefepime for ?PNA (RLL opacity on CXR).  Elevated WBCs may also be related to hydrocortisone (stress dose, tapering down).    HR stable 90s atrial fibrillation, SBP 90s-100s on low dose midodrine.   Warfarin on hold, getting heparin gtt for valves.   On exam, JVP 12+ cm, 1+ edema to knees, irregular S1S2 with mechanical valve sounds.   Patient is volume overloaded on exam, suspect this is the main cause for his respiratory failure.  Unfortunately, he also has had steadily worsening renal function since CVVH was stopped.  - Agree with trial of Lasix 120 mg IV every 8hrs.  If response is not adequate, will need consideration of CVVH again, depending on goals of care.  I think that he would be a poor long-term HD candidate.  - INR has come down, discussed with CCM => will get CVL to follow CVP and co-ox.   CRITICAL CARE Performed by: Loralie Champagne  Total critical care time: 35 minutes  Critical care time was exclusive of separately billable procedures and treating other  patients.  Critical care was necessary to treat or prevent imminent or life-threatening deterioration.  Critical  care was time spent personally by me on the following activities: development of treatment plan with patient and/or surrogate as well as nursing, discussions with consultants, evaluation of patient's response to treatment, examination of patient, obtaining history from patient or surrogate, ordering and performing treatments and interventions, ordering and review of laboratory studies, ordering and review of radiographic studies, pulse oximetry and re-evaluation of patient's condition.  Loralie Champagne 08/31/2019 3:32 PM

## 2019-08-31 NOTE — Progress Notes (Signed)
Nutrition Follow-up  DOCUMENTATION CODES:   Severe malnutrition in context of chronic illness  INTERVENTION:   When able to resume TF, recommend:   Jevity 1.5 at 55 ml/h (1320 ml per day)   Pro-stat 30 ml BID   Provides 2180 kcal, 114 gm protein, 1003 ml free water daily  NUTRITION DIAGNOSIS:   Severe Malnutrition related to chronic illness(CHF) as evidenced by severe fat depletion, severe muscle depletion.  Ongoing   GOAL:   Patient will meet greater than or equal to 90% of their needs  Unmet, TF off  MONITOR:   Labs, TF tolerance, Skin, I & O's  ASSESSMENT:   58 year old male with history of Bentall procedure, MVR, Afib, T2DM, CAD with chronic systolic and diastolic CHF, Bells palsy, h/o CVA, OSA, GERD, HTN, Gout, PVD s/p mitral valve replacement 2016, testicular cancer, and tubulovillous adenoma of colon who presented to ED with fatigue, weakness, and hypotension. Patient found to be profoundly bradycardic with severe left bundle branch block; severely hyperkalemic with new onset acute renal failure. Patient admitted to ICU for emergent hemodialysis.  12/23 NG tube placed after extubation. 12/24 CRRT stopped. 12/30 TF held for BiPAP, patient made DNI.  TF remains on hold due to BiPAP requirement. MD spoke with wife today and patient was made DNI. Poor prognosis. Patient does not want to be re-intubated. May need to resume CRRT. Palliative Care team has been consulted.    Labs reviewed.  Potassium 3.9 WNL, phosphorus 5.5 (H), magnesium 2.9 (H) CBG's: 109-108  Medications reviewed and includes free water 50 ml every hour, novolog, solu-cortef, lasix, nulecit.  Weight trending down. 12/23 73.7 kg 12/30 61 kg I/O - 12.5 L since admission.  Diet Order:   Diet Order            Diet NPO time specified Except for: Ice Chips  Diet effective now              EDUCATION NEEDS:   No education needs have been identified at this time  Skin:  Skin Assessment:  Skin Integrity Issues: Skin Integrity Issues:: Unstageable, Stage II Stage II: R buttocks Unstageable: coccyx   Last BM:  (P) 12/30 type 6  Height:   Ht Readings from Last 1 Encounters:  08/23/19 5' 2.21" (1.58 m)    Weight:   Wt Readings from Last 1 Encounters:  08/31/19 61 kg    Ideal Body Weight:  53.6 kg  BMI:  Body mass index is 24.43 kg/m.  Estimated Nutritional Needs:   Kcal:  2000-2200  Protein:  100-115 gm  Fluid:  >/= 2 L    Molli Barrows, RD, LDN, [place] Pager [phone] After Hours Pager [phone]

## 2019-08-31 NOTE — Progress Notes (Signed)
Lance Cook, [place] states ok to give medications via NG tube.

## 2019-08-31 NOTE — Progress Notes (Signed)
SLP Cancellation Note  Patient Details Name: Lance Cook MRN: [id number] DOB: [birth year]   Cancelled treatment:   Pt on BiPAP; not appropriate for therapy. Pt's wife has changed status to DNI, SLP will follow along.  Dahlia Nifong L. Tivis Ringer, [place] CCC/SLP Acute Rehabilitation Services Office number [phone] Pager [phone]        [name] 08/31/2019, 12:06 PM

## 2019-08-31 NOTE — Progress Notes (Signed)
Responded to page to unit to visit with wife who is in distress (and denial) over the news she received this AM regarding her husband's failing health.   Nurses were working with Mr. Slavey when I arrived.  Nurse introduced me to Mrs. Wyke.  We walked out into the hallway and visited.  Offered ministry of presence and heard her fears.  Returned to the patient's room.  Mr. Viscardi looked up at me and made the sign of the cross.  I offered prayers aloud for Mr. and Mrs. Creeden confirming their faith and assuring them God is holding them in the palm of his hand. Present when Mr. Swicegood's physician spoke with Mrs. Miklas in hallway outside of patient's room.  Mrs. Snare remarked to physician that no one was giving her positive news and she desperately wanted to hear her husband is coming home soon.  Physician gently indicated her husband's situation was grave and they were doing everything they could to help him but it might not be enough.  Mrs. Genrich answered a call from her minister and asked doctor to tell him of Mr. Hlavaty situation..  The minister asked very informed, direct medical-based questions the physician answered.  Mrs. Shuster stayed on the phone with the minister and held phone to Mr. Dimock's ear.  Minister addressed Mr. Dacy directly and through prayer, incorporated every medical thing he heard and understood to be true in a prayer calling on God to heal every single thing wrong, boldly asking God to return Mr. Tallon to perfect health.  Excused myself from  room as Mr.and Mrs. Casstevens continued to be comforted by [employer].  Chaplains stand by to minister to Mr. and Mrs. Purnell as needed.  De Burrs Chaplain Resident

## 2019-08-31 NOTE — Progress Notes (Signed)
Pt has BP dream station ordered QHS, application is contraindicated d/t pt having an OG tube in place at this time. RT will continue to monitor.

## 2019-08-31 NOTE — Progress Notes (Signed)
Lance Fujita, NP and Dr. Elzie Rings at bedside to assess patient. Chest x-ray has just been completed. Patient continues to require Bipap, RR-40. Verbal order from Lance Rasmussen, NP to hold PO meds until decision is made about the plan of care. Patient is awake and follows commands. He is able to communicate with staff with a marker board. He was able to point to numbers to indicate what channel to put the television on.

## 2019-08-31 NOTE — Progress Notes (Signed)
[facility] Progress Note Patient Name: Lance Cook DOB: 1961-02-18 MRN: [id number]   Date of Service  08/31/2019  HPI/Events of Note  Pt is quite somnolent on BIPAP despite reasonable ABG earlier.  eICU Interventions  I've asked PCCM on the ground to evaluate him regarding possible need for intubation on the basis of inability to protect his airway.        Kerry Kass Aissa Lisowski 08/31/2019, 6:20 AM

## 2019-08-31 NOTE — Progress Notes (Signed)
ANTICOAGULATION CONSULT NOTE - Follow Up Consult  Pharmacy Consult for Heparin Indication: Hx St Jude MVR/AVR + Afib  Patient Measurements: Height: 5' 2.21" (158 cm) Weight: 134 lb 7.7 oz (61 kg) IBW/kg (Calculated) : 55.07 Heparin Dosing Weight: 70 kg  Vital Signs: Temp: 98.1 F (36.7 C) (12/30 1500) Temp Source: Axillary (12/30 1500) BP: 109/77 (12/30 1700) Pulse Rate: 102 (12/30 1700)  Labs: Recent Labs    08/29/19 0529 08/30/19 0345 08/30/19 1422 08/31/19 0416 08/31/19 0447 08/31/19 1542  HGB 8.7* 9.1*  --  9.9* 8.8*  --   HCT 30.8* 32.3*  --  29.0* 31.7*  --   PLT 229 274  --   --  244  --   LABPROT 44.2* 64.3* 53.2*  --  24.6*  --   INR 4.7* 7.5* 5.9*  --  2.2*  --   HEPARINUNFRC  --   --   --   --   --  <0.10*  CREATININE 2.12* 2.39*  --   --  2.55* 2.60*    Estimated Creatinine Clearance: 24.1 mL/min (A) (by C-G formula based on SCr of 2.6 mg/dL (H)).  Assessment: 58 yr old male on warfarin PTA for hx St Jude MVR + Afib - admitted with AKI and hyperkalemia requiring line replacement and start of CRRT. PTA warfarin dose: 5mg  daily  Warfarin has been held since Sunday d/t rising INR - peaked at 7.5, now 2.2 (reversed w Vit K 5 mg x1, 2 FFP). CBC stable overnight.  Heparin level this evening undetectable, Rn states heparin level drawn before heparin turned off. Heparin off for central line placement, will follow up and restart at slightly higher rate.    Goal of Therapy:  INR 2.5-3.5 HL 0.3 - 0.7 Monitor platelets by anticoagulation protocol: Yes   Plan:  Continue to hold warfarin Restart heparin at 1100 units/hr once cleared post central line placement  Erin Hearing PharmD., BCPS Clinical Pharmacist 08/31/2019 5:36 PM  Please check AMION for all [place] numbers

## 2019-08-31 NOTE — Progress Notes (Signed)
NAME:  Lance Cook, MRN:  [id number], DOB:  June 16, 1961, LOS: 72 ADMISSION DATE:  08/30/2019, CONSULTATION DATE:  08/09/2019 REFERRING MD:  Dr Sherry Ruffing, CHIEF COMPLAINT:  Hyperkalemia    Brief History   58 yo male with PMH significant for Bentall procedure, MVR, Afib, CAD, systolic and diastolic heart failure admitted with bradycardia and found to have severe life-threatening hyperkalemia with AKI and dig toxicity, PCCM consulted for admission and need for urgent iHD. Hospital course included mechanical ventilation 12/18-12/23.    History of present illness   58 year old man, history of Bentall procedure, MVR, atrial fibrillation, CAD with chronic systolic and diastolic CHF.  Came to the ER with fatigue, weakness and hypotension.  He was found to be profoundly bradycardic with severe left bundle branch block and QRS widening on EKG.  Found to be severely hyperkalemic with new acute renal failure.  He is on digoxin at baseline and his digoxin level was 2.6.  He will be admitted emergently to the ICU for hemodialysis. He was given digibind for dig toxicity.   Significant Hospital Events   12/17 Admitted, urgent HD 12/18 had HD catheter in carotid removed in OR. Returned intubated 12/23 Extubated  Consults:  Nephrology Heart Failure  Procedures:  R IJ HD catheter 12/17 >> Misplaced in carotid, removed in OR 12/18 L IJ HD catheter 12/17 >>  ETI 12/18>>12/23  Significant Diagnostic Tests:  NM cardiac amyloid scan 10/21 >> equivocal for any evidence amyloidosis  ECHO 12/18 > LVEF 30 to 35%. No left ventricular hypertrophy. RV has normal systolic function. The mitral valve has been repaired/replaced. Mild mitral valve regurgitation. Moderate mitral stenosis. Tricuspid valve regurgitation mild-moderate. 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  S/P Bentall.  Moderately elevated pulmonary artery systolic pressure. . A pacer wire is visualized in the RV and RA. Right atrial pressure of 15  mmHg. History of mechanical aortic and mitral valves. Leaflets not well visualized, could consider fluroscopy of leaflets, but gradients not suggestive of obstruction.  CXR 12/17 > New left internal jugular central line tip in the SVC above the right atrium. No pneumothorax. No other change.  Micro Data:  COVID 12/17 > negative  Blood cultures 12/17 >NGTD   MRSA PCR 12/17 >negative  Urine culture 12/18 >  Negative  Antimicrobials:  Cefazolin 12/18 SCIP  Vanco 12/30 >> Cefepime 12/30 >>  Interim history/subjective:  No diuresis, + 1 L Temp 100.5 Hemodynamics stable Overnight, patient became tachypneic with RR 30-50's, started on Bipap.   Objective   Blood pressure (!) 120/57, pulse 100, temperature (!) 100.5 F (38.1 C), temperature source Axillary, resp. rate (!) 33, height 5' 2.21" (1.58 m), weight 61 kg, SpO2 100 %.    FiO2 (%):  [40 %] 40 %   Intake/Output Summary (Last 24 hours) at 08/31/2019 0747 Last data filed at 08/31/2019 0600 Gross per 24 hour  Intake 1962.24 ml  Output 700 ml  Net 1262.24 ml   Filed Weights   08/29/19 0402 08/30/19 0426 08/31/19 0500  Weight: 67.1 kg 69.7 kg 61 kg   Examination: General: Deconditioned adult male HEENT: [place]/AT, NG tube, MM pink/moist, PERRL  Neuro: Alert and oriented, able to follow all commands, globally weak and deconditioned, dysphonia CV:  s1s2 regular rate and rhythm, no murmur, rubs, or gallops, + lower extremity edema and RUE edema PULM: symmetric expansion, crackles on the right, tachypnea GI: soft, bowel sounds active in all 4 quadrants, non-tender, non-distended, tolerating TF Extremities: warm/dry, +2 pitting lower extremity edema ,  RUE edema Skin: no rashes or lesions  Resolved Hospital Problem list   Coagulopathy with elevated INR Hyperkalemia Digoxin Toxicity  Assessment & Plan:   Acute hypoxemic respiratory failure, now on Bipap -Extubated 12/23 Plan: -Pulmonary hygiene: mobilize with PT, cough,  IS -Bronchodilators available -Diuresis -Low grade temps, start abx: Vanc/Cefepime for possible PNA -Continue Bipap, patient remains tachypneic.  In discussions with patient, he does not want the ventilator, even if that means he would not survive this hospitalization.  We have reached out to his wife to ask her to come in, updated he acutely decompensated overnight and now on continuous Bipap. We are supporting him by starting abx for PNA and giving lasix.  She is requesting that a physician update her on arrival. Dr. Loanne Drilling and Cardiology have been notified of family requests.   Right neck hematoma secondary to a right IJ hemodialysis catheter with arterial punctur -Status post intervention per vascular surgery 08/20/2019 Plan: -Continue to monitor wound  Acute encephalopathy, resolved Plan: -Alert and oriented, continue delirium prevention measures  AKI on CKD 3 - S/p CRRT 12/18-12/24  - Etiology of his renal failure likely includes use of diuretics, poor p.o. intake, shock.  Plan -Nephrology following  -Creatinine elevated today.  Patient appears volume overloaded. Given acute respiratory decline, lasix challenge this am.   Cardiogenic Shock Acute on Chronic systolic and diastolic CHF (EF 99991111) P: -Midodrine, decreased dose 12/28.  Titration per HF -Stress dose steroids: BID, plans to discontinue 12/31 if remains hemodynamically stable -Hemodynamics stable overnight  CAD Mechanical mitral valve Chronic atrial fibrillation H/o VT s/p AICD/pacer  - Per HF, LV lead is not currently on due to diaphragmatic stimulation P: -Coumadin dosing per pharmacy.  INR reversal started yesterday for CVC placement, INR: 2.2 this am. Heparin drip until INR therapeutic.   -Continue PO amio -Goal K>4, Mg>2   DM type 2 Plan -SSI  Anemia Plan -Goal Hgb > 7, no indication for transfusion  Deconditioning -chronic and critical illness Plan -PT/OT following   Hypernatremia --FWF 50  ml/hr, currently on hold. Will re-evaluate after family discussions if able to resume   Leukocytosis Low grade temp --On steroids --Concern for infectious process, likely source: pulmonary --Will culture: Blood Cx, UA. Empiric abx with Vanc/Cefepime. Repeat MRSA nares for Vanc deescalation  Best practice:  Diet: tube feeds per recs: Currently on hold. Pain/Anxiety/Delirium protocol (if indicated): na VAP protocol (if indicated): na DVT prophylaxis: warfarin, pharmacy to dose. Heparin drip GI prophylaxis: PPI Glucose control: SSI Mobility: PT/OT Code Status: Full, pending GOC discussions with wife and patient.  Family Engagement: Wife updated via phone, will update on arrival to bedside as well.  Disposition: ICU.  Labs   CBC: Recent Labs  Lab 08/27/19 0459 08/28/19 0505 08/29/19 0529 08/30/19 0345 08/31/19 0416 08/31/19 0447  WBC 12.4* 17.0* 19.4* 26.2*  --  22.0*  HGB 8.2* 8.0* 8.7* 9.1* 9.9* 8.8*  HCT 30.0* 28.5* 30.8* 32.3* 29.0* 31.7*  MCV 75.0* 74.6* 75.3* 75.8*  --  78.7*  PLT 151 170 229 274  --  [id number]    Basic Metabolic Panel: Recent Labs  Lab 08/27/19 0459 08/28/19 0505 08/29/19 0529 08/30/19 0345 08/31/19 0416 08/31/19 0447  NA 138 143 148* 151* 150* 151*  K 4.5 3.8 4.5 3.7 4.0 3.9  CL 102 104 106 108  --  109  CO2 29 31 31 31   --  31  GLUCOSE 149* 143* 122* 117*  --  113*  BUN 67* 82* 94*  112*  --  118*  CREATININE 1.83* 1.93* 2.12* 2.39*  --  2.55*  CALCIUM 8.5* 9.1 9.5 9.4  --  9.2  MG 2.6* 2.5* 2.7* 2.8*  --  2.9*  PHOS 4.2 4.0 4.6 4.2  --  5.5*   GFR: Estimated Creatinine Clearance: 24.6 mL/min (A) (by C-G formula based on SCr of 2.55 mg/dL (H)). Recent Labs  Lab 08/28/19 0505 08/29/19 0529 08/30/19 0345 08/31/19 0447  WBC 17.0* 19.4* 26.2* 22.0*    Liver Function Tests: Recent Labs  Lab 08/26/19 2021 08/27/19 0459 08/28/19 0505 08/29/19 0529 08/30/19 0345 08/31/19 0447  AST 11*  --   --   --   --   --   ALT 18  --   --   --    --   --   ALKPHOS 66  --   --   --   --   --   BILITOT 0.2*  --   --   --   --   --   PROT 5.0*  --   --   --   --   --   ALBUMIN 2.0* 1.9* 1.9* 2.2* 2.2* 2.2*    ABG    Component Value Date/Time   PHART 7.435 08/31/2019 0416   PCO2ART 52.0 (H) 08/31/2019 0416   PO2ART 95.0 08/31/2019 0416   HCO3 35.1 (H) 08/31/2019 0416   TCO2 37 (H) 08/31/2019 0416   ACIDBASEDEF 1.0 01/26/2015 0425   O2SAT 98.0 08/31/2019 0416     Coagulation Profile: Recent Labs  Lab 08/28/19 0505 08/29/19 0529 08/30/19 0345 08/30/19 1422 08/31/19 0447  INR 5.0* 4.7* 7.5* 5.9* 2.2*    CBG: Recent Labs  Lab 08/30/19 1135 08/30/19 1719 08/30/19 2050 08/30/19 2316 08/31/19 0538  GLUCAP 160* 99 142* 173* 109*   Paulita Fujita, ACNP [facility]  After hours pager: [phone] CCT:50 min

## 2019-08-31 NOTE — Progress Notes (Signed)
[name], patient's wife is at the bedside and requested this RN to call the hospital chaplin. Chaplin contacted and asked to please come speak with the patient and his wife.

## 2019-08-31 NOTE — Progress Notes (Signed)
[facility] Progress Note Patient Name: Lance Cook DOB: 1961/02/27 MRN: [id number]   Date of Service  08/31/2019  HPI/Events of Note  Tachypnea likely related to progressive atelectasis, Saturation is 95 %  eICU Interventions  Trial of BIPAP for lung expansion Rx.        Kerry Kass [name] 08/31/2019, 3:33 AM

## 2019-08-31 NOTE — Progress Notes (Signed)
ANTICOAGULATION CONSULT NOTE - Follow Up Consult  Pharmacy Consult for Heparin>>lovenox + warfarin Indication: Hx St Jude MVR/AVR + Afib  Patient Measurements: Height: 5' 2.21" (158 cm) Weight: 134 lb 7.7 oz (61 kg) IBW/kg (Calculated) : 55.07 Heparin Dosing Weight: 70 kg  Vital Signs: Temp: 100.5 F (38.1 C) (12/30 0720) Temp Source: Axillary (12/30 0720) BP: 120/57 (12/30 0720) Pulse Rate: 100 (12/30 0720)  Labs: Recent Labs    08/29/19 0529 08/30/19 0345 08/30/19 1422 08/31/19 0416 08/31/19 0447  HGB 8.7* 9.1*  --  9.9* 8.8*  HCT 30.8* 32.3*  --  29.0* 31.7*  PLT 229 274  --   --  244  LABPROT 44.2* 64.3* 53.2*  --  24.6*  INR 4.7* 7.5* 5.9*  --  2.2*  CREATININE 2.12* 2.39*  --   --  2.55*    Estimated Creatinine Clearance: 24.6 mL/min (A) (by C-G formula based on SCr of 2.55 mg/dL (H)).  Assessment: 58 yr old male on warfarin PTA for hx St Jude MVR + Afib - admitted with AKI and hyperkalemia requiring line replacement and start of CRRT. PTA warfarin dose: 5mg  daily  Warfarin has been held since Sunday d/t rising INR - peaked at 7.5, now 2.2 (reversed w Vit K 5 mg x1, 2 FFP)  Cbc stable  Goal of Therapy:  INR 2.5-3.5 HL 0.3 - 0.7 Monitor platelets by anticoagulation protocol: Yes   Plan:  Continue to hold warfarin Heparin 950 units/hr Initial lvl [place], PharmD, BCPS, BCCCP Clinical Pharmacist [phone]  Please check AMION for all [place] numbers  08/31/2019 8:05 AM

## 2019-08-31 NOTE — Progress Notes (Signed)
PT Cancellation Note  Patient Details Name: Lance Cook MRN: [id number] DOB: 1961/01/03   Cancelled Treatment:    Reason Eval/Treat Not Completed: Patient not medically ready. Pt had a rough night, became SOB, tachypneic and had to be placed on BiPAP. RN and NP deferred  PT today. PT to return as able, as appropriate.  Kittie Plater, PT, DPT Acute Rehabilitation Services Pager #: [phone] Office #: [phone]    Berline Lopes 08/31/2019, 7:45 AM

## 2019-08-31 NOTE — Progress Notes (Signed)
Patient with a decline in respiratory status through the early morning.  Is a wake alert and fully oriented on BIPAP but breathing 42 times a minute.  ABG is okay with pH of 7.4 and pO2 of 95 on 40%.  CXR is still pending. Patient is adamant about not being placed back on the ventilator.  I have attempted to call his wife at [phone] to have her come to the hospital to be privy to this conversation and decision but get sent to voice mail.  Will await CXR prior to making further decision in regards to this and ask day shift to further address the situation.

## 2019-08-31 NOTE — Progress Notes (Signed)
PCCM Progress Note  Patient has expressed not wanting to be on mechanical ventilation with me and PCCM NP. Discussed this decision with the wife. She tearfully states she wants the best for him but wants to respect his decision to not be intubated.  Will change code status to DNI. Will continue current medical management at this time. Will continue addressing goals of care today.  Rodman Pickle, M.D. [facility] Pulmonary/Critical Care Medicine 08/31/2019 10:34 AM

## 2019-08-31 NOTE — Progress Notes (Signed)
Patient very tachypneic, breathing 48-50 times a minute. Sounds like he needs suctioning, so attempted to suction orally and nasally with little luck. Notified Elink. Applying Bipap per MD order. Will continue to monitor.   Jackalyn Lombard

## 2019-08-31 NOTE — Progress Notes (Signed)
Patient remains tachypneic and congested on bipap, still awaiting ground teams arrival. Called Elink to check status. Will continue to monitor.  Lance Cook

## 2019-08-31 NOTE — Progress Notes (Addendum)
[facility] NEPHROLOGY PROGRESS NOTE  Assessment/ Plan: Pt is a 58 y.o. yo male CHF, mechanical mitral and aortic valve A. fib, s/p VT arrest, DM, potassium of 8.4 associated with hypotension and AKI on admission.  #Severe hyperkalemia with EKG changes due to use of spironolactone, KCl and AKI: Received urgent dialysis on 12/17, started CRRT on 12/18 for fluid overload- stopped on 12/24.  Resolved.   #Acute on CKD stage III: Baseline creatinine around 1.3-1.6, SCr worseing.  Unclear if this is cardiorenal or not, but likley.  Prognosis is increasingly poor.  Bolus lasix today, will d/w wife startign CRRT again, though I am concerned that it will not affect his outcome.   # Hypernatremia, originally sig hyponatremia.  Stable, mild.    #Acute on Chronic CHF, aortic and mitral valve replacement: S/p UF with CRRT.  UOP ok.  AHF following and as above  #Hypotension/shock: Not on pressors currenlty. Midodrine  + hydrocortisone  #Acute respiratory failure/VDRF: worsened 12/30, on BiPAP. Per CCM  Anemia- needs replete iron-  Has allergy listed, Tolerated test dose, getting Fe.   Coagulopathy, improved  ? PNA, per CCM  12/30 12p Update: met with wife at bedside, and pastor over phone. They remain hopeful for him 'going home'. Expressed concerns about current state and trajectory.  Seems to have had decent UOP from lasix.  Plan will be cont lasix, schedule 120 IV q8h, chec, BMP 1600.  If vol status or renal status sig worsen will req CRRT again.  Is DNI at this time.    Subjective:    Progressive resp distress on BiPAP, is DNI   SCr 2.55, 0.7L UOP, K 3.9  SNa 151, off TFs and FWF  Low grade fever overnight  Objective Vital signs in last 24 hours: Vitals:   08/31/19 0800 08/31/19 0900 08/31/19 1000 08/31/19 1100  BP: 114/61 116/70 104/63   Pulse: (!) 106 (!) 104 (!) 103   Resp: (!) 22 (!) 29 (!) 41   Temp:    99.4 F (37.4 C)  TempSrc:    Axillary  SpO2: 99% 98% 98%    Weight:      Height:       Weight change: -8.7 kg  Intake/Output Summary (Last 24 hours) at 08/31/2019 1148 Last data filed at 08/31/2019 1000 Gross per 24 hour  Intake 1329.66 ml  Output 730 ml  Net 599.66 ml       Labs: Basic Metabolic Panel: Recent Labs  Lab 08/29/19 0529 08/30/19 0345 08/31/19 0416 08/31/19 0447  NA 148* 151* 150* 151*  K 4.5 3.7 4.0 3.9  CL 106 108  --  109  CO2 31 31  --  31  GLUCOSE 122* 117*  --  113*  BUN 94* 112*  --  118*  CREATININE 2.12* 2.39*  --  2.55*  CALCIUM 9.5 9.4  --  9.2  PHOS 4.6 4.2  --  5.5*   Liver Function Tests: Recent Labs  Lab 08/26/19 2021 08/29/19 0529 08/30/19 0345 08/31/19 0447  AST 11*  --   --   --   ALT 18  --   --   --   ALKPHOS 66  --   --   --   BILITOT 0.2*  --   --   --   PROT 5.0*  --   --   --   ALBUMIN 2.0* 2.2* 2.2* 2.2*   No results for input(s): LIPASE, AMYLASE in the last 168 hours. Recent Labs  Lab 08/26/19 2021  AMMONIA 28   CBC: Recent Labs  Lab 08/27/19 0459 08/28/19 0505 08/29/19 0529 08/30/19 0345 08/31/19 0416 08/31/19 0447  WBC 12.4* 17.0* 19.4* 26.2*  --  22.0*  HGB 8.2* 8.0* 8.7* 9.1* 9.9* 8.8*  HCT 30.0* 28.5* 30.8* 32.3* 29.0* 31.7*  MCV 75.0* 74.6* 75.3* 75.8*  --  78.7*  PLT 151 170 229 274  --  244   Cardiac Enzymes: No results for input(s): CKTOTAL, CKMB, CKMBINDEX, TROPONINI in the last 168 hours. CBG: Recent Labs  Lab 08/30/19 1719 08/30/19 2050 08/30/19 2316 08/31/19 0538 08/31/19 1121  GLUCAP 99 142* 173* 109* 108*    Iron Studies:  No results for input(s): IRON, TIBC, TRANSFERRIN, FERRITIN in the last 72 hours. Studies/Results: DG Abd 1 View  Result Date: 08/29/2019 CLINICAL DATA:  58 y.o. yo male with h/o CHF (EF 30%), mechanical mitral and aortic valve A. fib, s/p VT arrest, ICD, DM, Bell's palsy, CVA came to the ER 08/16/2019 with fatigue, weakness and hypotension; potassium of 8.4 associated with hypotens.*comment was truncated* EXAM:  ABDOMEN - 1 VIEW COMPARISON:  Abdominal radiograph 08/28/2019 FINDINGS: NG tube with tip in stomach. No dilated large or small bowel. Gas in the rectum. Mild basilar atelectasis. Cardiomegaly. IMPRESSION: 1. No interval change. 2. No bowel obstruction. Electronically Signed   By: Suzy Bouchard M.D.   On: 08/29/2019 17:52   DG CHEST PORT 1 VIEW  Result Date: 08/31/2019 CLINICAL DATA:  Tachypnea EXAM: PORTABLE CHEST 1 VIEW COMPARISON:  Three days ago FINDINGS: Enteric tube with tip over the stomach. Biventricular pacer from the left with leads in unremarkable position. The left IJ line has been removed. Prior median sternotomy with mitral valve replacement low volume chest with asymmetric haziness on the right. No generalized Kerley lines or pneumothorax. IMPRESSION: 1. Mild improvement in aeration. 2. Asymmetric airspace opacity greater on the right. This could be pneumonia in the setting of rising white count. Electronically Signed   By: Monte Fantasia M.D.   On: 08/31/2019 08:01    Medications: Infusions: . sodium chloride 10 mL/hr at 08/31/19 1000  . ceFEPime (MAXIPIME) IV    . feeding supplement (JEVITY 1.5 CAL/FIBER) Stopped (08/31/19 0415)  . ferric gluconate (FERRLECIT/NULECIT) IV 125 mg (08/31/19 1032)  . heparin 950 Units/hr (08/31/19 1000)  . norepinephrine (LEVOPHED) Adult infusion Stopped (08/30/19 0929)    Scheduled Medications: . sodium chloride   Intravenous Once  . sodium chloride   Intravenous Once  . albuterol  2.5 mg Nebulization TID  . amiodarone  200 mg Per Tube BID  . chlorhexidine  15 mL Mouth Rinse BID  . Chlorhexidine Gluconate Cloth  6 each Topical Q0600  . darbepoetin (ARANESP) injection - NON-DIALYSIS  100 mcg Subcutaneous Q Wed-1800  . feeding supplement (PRO-STAT SUGAR FREE 64)  30 mL Per Tube BID  . free water  50 mL Per Tube Q1H  . hydrocortisone sodium succinate  25 mg Intravenous Q12H  . hydroxypropyl methylcellulose / hypromellose  1 drop Both Eyes  TID  . insulin aspart  2-6 Units Subcutaneous Q6H  . mouth rinse  15 mL Mouth Rinse q12n4p  . midodrine  2.5 mg Oral TID WC  . pantoprazole sodium  40 mg Per Tube Daily    have reviewed scheduled and prn medications.  Physical Exam: General: somnolent on BiPAP Heart:IRIR, s1s2 nl Lungs: Coarse breath sound bilateral Abdomen:soft,  non-distended Extremities: LE edema + pitting to dep areas Dialysis Access: none present  Thurmond Butts  B Corliss Coggeshall 08/31/2019,11:48 AM  LOS: 13 days

## 2019-08-31 NOTE — Progress Notes (Signed)
Pt is has NGT with TF infusing, no current respiratory distress. Holding bipap at this time, ELink aware.

## 2019-08-31 NOTE — Procedures (Signed)
Cental line placement Consent obtained from wife Timeout performed Left neck prepped and draped Using sterile seldinger technique under ultrasound guidance, triple lumen central line advanced into left internal jugular vein and secured at 18cm. All ports flushed easily. Sutured in place and sterile dressing applied. CXR pending.  Erskine Emery MD PCCM

## 2019-09-01 DIAGNOSIS — E876 Hypokalemia: Secondary | ICD-10-CM

## 2019-09-01 DIAGNOSIS — R5381 Other malaise: Secondary | ICD-10-CM

## 2019-09-01 DIAGNOSIS — E870 Hyperosmolality and hypernatremia: Secondary | ICD-10-CM

## 2019-09-01 DIAGNOSIS — R64 Cachexia: Secondary | ICD-10-CM

## 2019-09-01 LAB — CBC
HCT: 31.3 % — ABNORMAL LOW (ref 39.0–52.0)
Hemoglobin: 8.6 g/dL — ABNORMAL LOW (ref 13.0–17.0)
MCH: 22.2 pg — ABNORMAL LOW (ref 26.0–34.0)
MCHC: 27.5 g/dL — ABNORMAL LOW (ref 30.0–36.0)
MCV: 80.7 fL (ref 80.0–100.0)
Platelets: 218 10*3/uL (ref 150–400)
RBC: 3.88 MIL/uL — ABNORMAL LOW (ref 4.22–5.81)
RDW: 26.3 % — ABNORMAL HIGH (ref 11.5–15.5)
WBC: 21.9 10*3/uL — ABNORMAL HIGH (ref 4.0–10.5)
nRBC: 1.8 % — ABNORMAL HIGH (ref 0.0–0.2)

## 2019-09-01 LAB — BASIC METABOLIC PANEL
Anion gap: 9 (ref 5–15)
Anion gap: 9 (ref 5–15)
BUN: 122 mg/dL — ABNORMAL HIGH (ref 6–20)
BUN: 125 mg/dL — ABNORMAL HIGH (ref 6–20)
CO2: 38 mmol/L — ABNORMAL HIGH (ref 22–32)
CO2: 38 mmol/L — ABNORMAL HIGH (ref 22–32)
Calcium: 8.5 mg/dL — ABNORMAL LOW (ref 8.9–10.3)
Calcium: 8.5 mg/dL — ABNORMAL LOW (ref 8.9–10.3)
Chloride: 111 mmol/L (ref 98–111)
Chloride: 112 mmol/L — ABNORMAL HIGH (ref 98–111)
Creatinine, Ser: 2.44 mg/dL — ABNORMAL HIGH (ref 0.61–1.24)
Creatinine, Ser: 2.58 mg/dL — ABNORMAL HIGH (ref 0.61–1.24)
GFR calc Af Amer: 33 mL/min — ABNORMAL LOW (ref >60)
GFR calc Af Amer: 30 mL/min — ABNORMAL LOW (ref >60)
GFR calc non Af Amer: 28 mL/min — ABNORMAL LOW (ref >60)
GFR calc non Af Amer: 26 mL/min — ABNORMAL LOW (ref >60)
Glucose, Bld: 154 mg/dL — ABNORMAL HIGH (ref 70–99)
Glucose, Bld: 110 mg/dL — ABNORMAL HIGH (ref 70–99)
Potassium: 3.2 mmol/L — ABNORMAL LOW (ref 3.5–5.1)
Potassium: 3.1 mmol/L — ABNORMAL LOW (ref 3.5–5.1)
Sodium: 158 mmol/L — ABNORMAL HIGH (ref 135–145)
Sodium: 159 mmol/L — ABNORMAL HIGH (ref 135–145)

## 2019-09-01 LAB — GLUCOSE, CAPILLARY
Glucose-Capillary: 99 mg/dL (ref 70–99)
Glucose-Capillary: 131 mg/dL — ABNORMAL HIGH (ref 70–99)
Glucose-Capillary: 85 mg/dL (ref 70–99)
Glucose-Capillary: 97 mg/dL (ref 70–99)

## 2019-09-01 LAB — MAGNESIUM
Magnesium: 2.4 mg/dL (ref 1.7–2.4)
Magnesium: 2.3 mg/dL (ref 1.7–2.4)

## 2019-09-01 LAB — COOXEMETRY PANEL
Carboxyhemoglobin: 2.5 % — ABNORMAL HIGH (ref 0.5–1.5)
Methemoglobin: 1.5 % (ref 0.0–1.5)
O2 Saturation: 75.2 %
Total hemoglobin: 8.8 g/dL — ABNORMAL LOW (ref 12.0–16.0)

## 2019-09-01 LAB — HEPARIN LEVEL (UNFRACTIONATED)
Heparin Unfractionated: <0.1 IU/mL — ABNORMAL LOW (ref 0.30–0.70)
Heparin Unfractionated: <0.1 IU/mL — ABNORMAL LOW (ref 0.30–0.70)
Heparin Unfractionated: 0.21 IU/mL — ABNORMAL LOW (ref 0.30–0.70)

## 2019-09-01 LAB — RENAL FUNCTION PANEL
Albumin: 2.1 g/dL — ABNORMAL LOW (ref 3.5–5.0)
Anion gap: 11 (ref 5–15)
BUN: 120 mg/dL — ABNORMAL HIGH (ref 6–20)
CO2: 38 mmol/L — ABNORMAL HIGH (ref 22–32)
Calcium: 8.8 mg/dL — ABNORMAL LOW (ref 8.9–10.3)
Chloride: 109 mmol/L (ref 98–111)
Creatinine, Ser: 2.49 mg/dL — ABNORMAL HIGH (ref 0.61–1.24)
GFR calc Af Amer: 32 mL/min — ABNORMAL LOW (ref >60)
GFR calc non Af Amer: 27 mL/min — ABNORMAL LOW (ref >60)
Glucose, Bld: 111 mg/dL — ABNORMAL HIGH (ref 70–99)
Phosphorus: 5.3 mg/dL — ABNORMAL HIGH (ref 2.5–4.6)
Potassium: 2.5 mmol/L — CL (ref 3.5–5.1)
Sodium: 158 mmol/L — ABNORMAL HIGH (ref 135–145)

## 2019-09-01 LAB — PROTIME-INR
INR: 1.7 — ABNORMAL HIGH (ref 0.8–1.2)
Prothrombin Time: 20.3 seconds — ABNORMAL HIGH (ref 11.4–15.2)

## 2019-09-01 MED ORDER — HEPARIN BOLUS VIA INFUSION
1000.0000 [IU] | Freq: Once | INTRAVENOUS | Status: AC
  Administered 2019-09-01: 1000 [IU] via INTRAVENOUS
  Filled 2019-09-01: qty 1000

## 2019-09-01 MED ORDER — FREE WATER
100.0000 mL | Status: DC
  Administered 2019-09-01 – 2019-09-04 (×56): 100 mL

## 2019-09-01 MED ORDER — WARFARIN SODIUM 2.5 MG PO TABS
2.5000 mg | ORAL_TABLET | Freq: Once | ORAL | Status: AC
  Administered 2019-09-01: 2.5 mg via ORAL
  Filled 2019-09-01: qty 1

## 2019-09-01 MED ORDER — POTASSIUM CHLORIDE 10 MEQ/50ML IV SOLN: 10.0000 meq | INTRAVENOUS | Status: DC

## 2019-09-01 MED ORDER — POTASSIUM CHLORIDE 10 MEQ/50ML IV SOLN
10.0000 meq | INTRAVENOUS | Status: AC
  Administered 2019-09-01 (×4): 10 meq via INTRAVENOUS
  Filled 2019-09-01 (×4): qty 50

## 2019-09-01 MED ORDER — POTASSIUM CHLORIDE 20 MEQ PO PACK
40.0000 meq | PACK | Freq: Once | ORAL | Status: AC
  Administered 2019-09-01: 13:00:00 40 meq via ORAL
  Filled 2019-09-01: qty 2

## 2019-09-01 MED ORDER — WARFARIN - PHARMACIST DOSING INPATIENT: Freq: Every day | Status: DC

## 2019-09-01 MED ORDER — FREE WATER
50.0000 mL | Status: DC
  Administered 2019-09-01 (×11): 50 mL

## 2019-09-01 MED ORDER — FREE WATER
75.0000 mL | Status: DC
  Administered 2019-09-01 (×6): 75 mL

## 2019-09-01 MED ORDER — FUROSEMIDE 10 MG/ML IJ SOLN
80.0000 mg | Freq: Three times a day (TID) | INTRAMUSCULAR | Status: DC
  Administered 2019-09-01 (×2): 80 mg via INTRAVENOUS
  Filled 2019-09-01 (×2): qty 8

## 2019-09-01 MED ORDER — POTASSIUM CHLORIDE 20 MEQ/15ML (10%) PO SOLN
40.0000 meq | Freq: Once | ORAL | Status: AC
  Administered 2019-09-01: 08:00:00 40 meq
  Filled 2019-09-01: qty 30

## 2019-09-01 MED ORDER — POTASSIUM CHLORIDE 20 MEQ PO PACK
40.0000 meq | PACK | ORAL | Status: AC
  Administered 2019-09-01 – 2019-09-02 (×2): 40 meq via ORAL
  Filled 2019-09-01 (×2): qty 2

## 2019-09-01 NOTE — Progress Notes (Addendum)
SLP Cancellation Note  Patient Details Name: Lance Cook MRN: [id number] DOB: July 05, 1961   Cancelled treatment:       Reason Eval/Treat Not Completed: Fatigue/lethargy limiting ability to participate; per notes and discussion with charge nurse, pt struggling to swallow ice chips, coughing and requiring suctioning. Will hold on PO trials today. SLP will f/u early next week (aiming for Monday, 1/3).  He is currently nutritionally supported.  Lance Cook L. Tivis Ringer, [place] Office number [phone] Pager [phone]    Lance Cook Laurice 09/01/2019, 2:32 PM

## 2019-09-01 NOTE — Consult Note (Signed)
Consultation Note Date: 09/01/2019   Patient Name: Lance Cook  DOB: 06-19-1961  MRN: [id number]  Age / Sex: 58 y.o., male  PCP: [name], MD Referring Physician: Margaretha Seeds, MD  Reason for Consultation: Establishing goals of care and Psychosocial/spiritual support  HPI/Patient Profile: 58 y.o. 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  He was found to be severely bradycardic with hyperkalemia and acute on chronic renal failure.  He developed cardiogenic shock and required urgent intubation and continuous renal replacement therapy.  He has since been able to wean from the vent.  He is still considered volume overloaded.  He is no longer on CRRT.  Diuresis is being attempted with IV diuretic drip.  The patient remains critically weak and deconditioned.  He is having great difficulty handling his secretions.  Core track is in place for nutrition.  He is felt to have cardiorenal syndrome.  If IV diuretic diuresis is not productive continue renal replacement therapy may be reinitiated.  Clinical Assessment and Goals of Care:  I have reviewed medical records including EPIC notes, labs and imaging, discussed his case with Dr. Loanne Drilling, received report from the ICU RN Melody, assessed the patient and then called his wife Lance Cook on the phone to discuss diagnosis prognosis, [place], EOL wishes, disposition and options.  I introduced Palliative Medicine as specialized medical care for people living with serious illness. It focuses on providing relief from the symptoms and stress of a serious illness. The goal is to improve quality of life for both the patient and the family.  I sat beside [name] in the ICU.   He is unable to speak.  His writing is not legible.  He appears alert and oriented.  He tries to communicate with hand motions.  He repeatedly asks for ice chips.  Unfortunately he coughs after ingesting each chip.  Carrie confirms he would not want intubation even if it was necessary to prolong his life.  I brought up hemodialysis.  Vashaun shook his head "no" consistently when I asked him about it.  Then I asked if he would consider hemodialysis if it was a matter of life or death and he nodded his head yes.  After multiple requests for ice chips and multiple episodes of coughing and suctioning I tried to talk with Asim about a path of continued aggressive medical care versus a path of comfort care.  Enos indicated that he did not want comfort care yet, rather he wanted to pursue more medical care.  I then called his wife Lance Cook on the phone.  We discussed a brief life review of the patient.  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  They met at work.  Within 2 weeks of meeting they were engaged.  They have been married for 25 years.  According to [facility] everyone that knows them said they  were the "perfect match ".  [name] states that if you knew J for 5 minutes he would love him.  They are both from [place] state.  Daquavion has very strong friendships as he is estranged from his family.  Both Yan and Lance Cook are spiritual and put their trust in God.  [name] comments that in recent months [name] has become rather sedentary.  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  She encourages him to get up and get moving.    [name] tells me that she had a stroke 1 year ago and that she is nearly legally blind.  Because of her stroke she has to ask others for help particularly with driving back and forth to the hospital and this is upsetting to her because she does not want to impose on anyone else..    I asked [name] what she understood about [name]'s medical situation. She explains that she is really unable to understand what the doctors tell her.   She admits that it is simply too much for her to take in.  She knows she has to be realistic but she is a very positive person and wants to focus on the positive. She prefers that the doctors explain things to her pastor Donata Clay.  [name] expresses "I know the doctors are doing everything they can ", but this is in God's hands.    In very broad strokes I explained that [name] has a very weak heart and that normally when people have a weak heart we rely on the kidneys to help remove fluid from their body.  [name]'s kidneys had failed when he came into the hospital.  At this point we are waiting to see if his kidneys can recover.    I told [name] that while we hope and pray for the best we also advise families to prepare for the worst.  Therefore if the best happens we can celebrate, but if the worst happens they at least have had some time to gather support and prepare things.  [name] was not ready to heed that advice yet.  She is very focused on [facility] improving.  Finally Lance Cook tells me about an event that happened to [name] several months back.  [name] says it felt like a dream but Jeanclaude knew it was not a dream.  Jesus came to [place] and said take my hand.  Itzael refused and instead took Jesus his coat.  Then God walked over and said "son what are you doing?  It is not time for him yet.  He belongs with his wife.  [name] feels strongly that what ever happens is in [name] and she trusts him implicitly.  After we talked I emailed [name] the PMT contact information at her request.  I offered to speak to her support person and Kelvin Cellar Rasor.     Primary Decision Maker:  PATIENT.  His wife would be his HCPOA if needed.    SUMMARY OF RECOMMENDATIONS     Patient has made it clear to me that he does not want intubation.    Per Dr. Loanne Drilling he does not want invasive procedures.  Patient indicated that at this point he does not want hemodialysis unless it was a matter of life or death.  He would like to continue on  with full scope treatment for now.  Wife needs understanding and support.  She is legally blind and has had a stroke.  She indicates that she  needs support from her pastor when doctors are speaking to her in order to better understand what they are saying.  PMT will follow with you -  helping with further definition of goals thru the patients medical evolution and supporting [name].   Code Status/Advance Care Planning:  DNI   Symptom Management:   Question if he may benefit from VERY low dose anti-anxiety medication  Additional Recommendations (Limitations, Scope, Preferences):  Full Scope Treatment and No intubation.    Palliative Prophylaxis:   Aspiration and Frequent Pain Assessment  Psycho-social/Spiritual:   Desire for further Chaplaincy support:  Yes please.  This family gains a great deal from Chaplain support.  Prognosis:   Very concerning.  Likely cardio-renal syndrome.  Current thought is that further dialysis will not benefit him long term.  I'm concerned he may not leave the hospital.  Currently he is at high risk of acute decline and death.  Discharge Planning: To Be Determined      Primary Diagnoses: Present on Admission: . Hyperkalemia   I have reviewed the medical record, interviewed the patient and family, and examined the patient. The following aspects are pertinent.  Past Medical History:  Diagnosis Date  . Anemia   . Anxiety   . ARDS (adult respiratory distress syndrome) ([place]) 01/27/2015  . Arthritis   . Ascending aortic dissection ([place]) 07/14/2008   Localized dissection of ascending aorta noted on CTA in 2009 and stable on CTA in 2011  . Atrial fibrillation ([facility])    chronic persistent  . Bell's palsy   . C. difficile diarrhea   . CAD (coronary artery disease)    Old scar inferior wall myoview, 10/2009 EF 52%.  He did have previous SVG to RCA but no obstructive disease noted on his most recent cath.  SVG occluded.   . Cellulitis 04/02/2014  .  Cerebral artery occlusion with cerebral infarction ([place]) 10/08/2011   Overview:  Overview:  And hx of TIA prior to CABG, all thought due to systemic emboli prior to coumadin   . Chronic diastolic heart failure ([place]) 03/01/2015   a. 12/2015: echo showing a preserved EF of 65-70%, moderate AS, and moderate TR.   Marland Kitchen CVA (cerebral vascular accident) ([place]) 10/08/2011   And hx of TIA prior to CABG, all thought due to systemic emboli prior to coumadin   . DIABETES MELLITUS, TYPE II 11/01/2009   Qualifier: Diagnosis of  By: [name] MD, [name]    . Diverticulosis   . 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 Gastric polyps   . GERD (gastroesophageal reflux disease)    not needing medication at thhis time- 01/22/15  . Gout   . Hiatal hernia   . History of colon polyps 12/23/2011   Overview:  Overview:  Colonoscopy January 2010, 6 mm rectal tubulovillous adenoma. No high-grade dysplasia   . Hyperlipidemia   . Hypertension   . Hypogonadism male 04/08/2012  . Impaired glucose tolerance 10/08/2011  . Morbid obesity ([place]) 04/02/2014  . OSA (obstructive sleep apnea)    CPAP  . Peripheral vascular disease ([place])   . 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 4/82/7078   33 mm St Jude bileaflet mechanical valve placed via right mini thoracotomy approach  . 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    1988 - Dr Blase Mess  at [facility] in [place], [place]  . Testicular cancer [facility])    He was 58 y/o. He had surgical resection and rad tx's.   Marland Kitchen TIA (transient ischemic attack)    age 36  . Tubulovillous adenoma of colon   . 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 Venous (peripheral) insufficiency 02/03/2005   Social History   Socioeconomic History  . Marital status: Married    Spouse name:  Not on file  . Number of children: 0  . Years of education: Not on file  . Highest education level: Not on file  Occupational History  . Occupation: Disabled Environmental education officer: UNEMPLOYED  Tobacco Use  . Smoking status: Former Smoker    Types: Cigars  . Smokeless tobacco: Never Used  . Tobacco comment: about 3 yearly- cigar  Substance and Sexual Activity  . Alcohol use: Yes    Alcohol/week: 0.0 standard drinks    Comment: social  . Drug use: No  . Sexual activity: Not on file  Other Topics Concern  . Not on file  Social History Narrative  . Not on file   Social Determinants of Health   Financial Resource Strain:   . Difficulty of Paying Living Expenses: Not on file  Food Insecurity:   . Worried About Charity fundraiser in the Last Year: Not on file  . Ran Out of Food in the Last Year: Not on file  Transportation Needs:   . Lack of Transportation (Medical): Not on file  . Lack of Transportation (Non-Medical): Not on file  Physical Activity:   . Days of Exercise per Week: Not on file  . Minutes of Exercise per Session: Not on file  Stress:   . Feeling of Stress : Not on file  Social Connections:   . Frequency of Communication with Friends and Family: Not on file  . Frequency of Social Gatherings with Friends and Family: Not on file  . Attends Religious Services: Not on file  . Active Member of Clubs or Organizations: Not on file  . Attends Archivist Meetings: Not on file  . Marital Status: Not on file   Family History  Problem Relation Age of Onset  . Heart disease Mother   . Throat cancer Mother   . Other Mother        bowel obstruction  . Diabetes Father   . Stroke Other   . Hypertension Other   . Cancer Maternal Aunt        lung, brain  . Cancer Maternal Uncle        brain aneurysm  . Colon cancer Neg Hx    Scheduled Meds: . sodium chloride   Intravenous Once  . sodium chloride   Intravenous Once  . albuterol  2.5 mg Nebulization TID  .  amiodarone  200 mg Per Tube BID  . chlorhexidine  15 mL Mouth Rinse BID  . Chlorhexidine Gluconate Cloth  6 each Topical Q0600  . darbepoetin (ARANESP) injection - NON-DIALYSIS  100 mcg Subcutaneous Q Wed-1800  . feeding supplement (PRO-STAT SUGAR FREE 64)  30 mL Per Tube BID  . free water  50 mL Per Tube Q1H  . furosemide  80 mg Intravenous Q8H  . hydroxypropyl methylcellulose / hypromellose  1 drop Both Eyes TID  . insulin aspart  2-6 Units Subcutaneous Q6H  . mouth rinse  15 mL Mouth Rinse q12n4p  . midodrine  2.5 mg Oral TID WC  .  pantoprazole sodium  40 mg Per Tube Daily   Continuous Infusions: . sodium chloride Stopped (09/01/19 0527)  . ceFEPime (MAXIPIME) IV Stopped (08/31/19 1258)  . feeding supplement (JEVITY 1.5 CAL/FIBER) 20 mL/hr at 09/01/19 0144  . ferric gluconate (FERRLECIT/NULECIT) IV 125 mg (09/01/19 0922)  . heparin 1,400 Units/hr (09/01/19 0950)  . norepinephrine (LEVOPHED) Adult infusion Stopped (08/30/19 0929)   PRN Meds:.sodium chloride, acetaminophen, albuterol, bisacodyl, heparin, lip balm, ondansetron (ZOFRAN) IV, polyethylene glycol, sennosides Allergies  Allergen Reactions  . Feraheme [Ferumoxytol] Shortness Of Breath    SOB/Tachycardia  . Penicillins Hives and Rash    Has patient had a PCN reaction causing immediate rash, facial/tongue/throat swelling, SOB or lightheadedness with hypotension: Yes Has patient had a PCN reaction causing severe rash involving mucus membranes or skin necrosis: Yes Has patient had a PCN reaction that required hospitalization unsure Has patient had a PCN reaction occurring within the last 10 years: 2010-2012 If all of the above answers are "NO", then may proceed with Cephalosporin use.  [name] Other (See Comments)    CDIFF, when taking oral tablets   . Tape Other (See Comments) and Itching    Skin irritation   Review of Systems patient non-verbal  Physical Exam  Chronically ill, deconditioned,  frail, gentleman.  Awake alert, sitting on hoyer lift pain in chair.  Cor trak in place CV tachy and irregular with loud valve clicks. Abdomen thin soft, nt, nd Ext.  Right upper ext swollen.  Lower extremities swollen with what appears to be chronic dark discoloration  Vital Signs: BP 117/67   Pulse 100   Temp 100.3 F (37.9 C) (Axillary)   Resp (!) 41   Ht 5' 2.21" (1.58 m)   Wt 60.1 kg   SpO2 100%   BMI 24.07 kg/m  Pain Scale: CPOT   Pain Score: 0-No pain   SpO2: SpO2: 100 % O2 Device:SpO2: 100 % O2 Flow Rate: .O2 Flow Rate (L/min): 2 L/min  IO: Intake/output summary:   Intake/Output Summary (Last 24 hours) at 09/01/2019 1011 Last data filed at 09/01/2019 0600 Gross per 24 hour  Intake 1824.16 ml  Output 3280 ml  Net -1455.84 ml    LBM: Last BM Date: 08/31/19 Baseline Weight: Weight: 80.3 kg Most recent weight: Weight: 60.1 kg     Palliative Assessment/Data: 20%     Time In: 9:30 Time Out: 10:45 Time Total: 75 min. Visit consisted of counseling and education dealing with the complex and emotionally intense issues surrounding the need for palliative care and symptom management in the setting of serious and potentially life-threatening illness. Greater than 50%  of this time was spent counseling and coordinating care related to the above assessment and plan.  Signed by: Florentina Jenny, PA-C Palliative Medicine Pager: [phone]  Please contact Palliative Medicine Team phone at [phone] for questions and concerns.  For individual provider: See Shea Evans

## 2019-09-01 NOTE — Progress Notes (Signed)
ANTICOAGULATION CONSULT NOTE - Follow Up Consult  Pharmacy Consult for Heparin>>lovenox + warfarin Indication: Hx St Jude MVR/AVR + Afib  Patient Measurements: Height: 5' 2.21" (158 cm) Weight: 132 lb 7.9 oz (60.1 kg) IBW/kg (Calculated) : 55.07 Heparin Dosing Weight: 70 kg  Vital Signs: Temp: 98 F (36.7 C) (12/31 1200) Temp Source: Axillary (12/31 1200) BP: 102/66 (12/31 1300) Pulse Rate: 117 (12/31 1300)  Labs: Recent Labs    08/30/19 0345 08/30/19 1422 08/31/19 0416 08/31/19 0447 08/31/19 1542 09/01/19 0513 09/01/19 1004 09/01/19 1305  HGB 9.1*  --  9.9* 8.8*  --  8.6*  --   --   HCT 32.3*  --  29.0* 31.7*  --  31.3*  --   --   PLT 274  --   --  244  --  218  --   --   LABPROT 64.3* 53.2*  --  24.6*  --  20.3*  --   --   INR 7.5* 5.9*  --  2.2*  --  1.7*  --   --   HEPARINUNFRC  --   --   --   --  <0.10* <0.10*  --  <0.10*  CREATININE 2.39*  --   --  2.55* 2.60* 2.49* 2.44*  --     Estimated Creatinine Clearance: 25.7 mL/min (A) (by C-G formula based on SCr of 2.44 mg/dL (H)).  Assessment: 58 yr old male on warfarin PTA for hx St Jude MVR + Afib - admitted with AKI and hyperkalemia requiring line replacement and start of CRRT. PTA warfarin dose: 5mg  daily  Warfarin has been held since Sunday d/t rising INR - peaked at 7.5, now 1.7  Hep lvl undetectable - infusion running without issue   Goal of Therapy:  INR 2.5-3.5 HL 0.3 - 0.7 Monitor platelets by anticoagulation protocol: Yes   Plan:  Bolus 1000 units x 1 heparin Increase gtt to 1600 units/hr Next lvl 2100  Barth Kirks, PharmD, BCPS, BCCCP Clinical Pharmacist [phone]  Please check AMION for all [facility] numbers  09/01/2019 2:45 PM

## 2019-09-01 NOTE — Progress Notes (Signed)
ANTICOAGULATION CONSULT NOTE - Follow Up Consult  Pharmacy Consult for Heparin, resume warfarin Indication: Hx St Jude MVR/AVR + Afib  Patient Measurements: Height: 5' 2.21" (158 cm) Weight: 132 lb 7.9 oz (60.1 kg) IBW/kg (Calculated) : 55.07 Heparin Dosing Weight: 70 kg  Vital Signs: Temp: 97.7 F (36.5 C) (12/31 1950) Temp Source: Oral (12/31 1950) BP: 96/58 (12/31 2200) Pulse Rate: 123 (12/31 2200)  Labs: Recent Labs    08/30/19 0345 08/30/19 1422 08/31/19 0416 08/31/19 0416 08/31/19 0447 09/01/19 0513 09/01/19 1004 09/01/19 1305 09/01/19 1700 09/01/19 2155  HGB 9.1*  --  9.9*  --  8.8* 8.6*  --   --   --   --   HCT 32.3*  --  29.0*  --  31.7* 31.3*  --   --   --   --   PLT 274  --   --   --  244 218  --   --   --   --   LABPROT 64.3* 53.2*  --   --  24.6* 20.3*  --   --   --   --   INR 7.5* 5.9*  --   --  2.2* 1.7*  --   --   --   --   HEPARINUNFRC  --   --   --    < >  --  <0.10*  --  <0.10*  --  0.21*  CREATININE 2.39*  --   --   --  2.55* 2.49* 2.44*  --  2.58*  --    < > = values in this interval not displayed.    Estimated Creatinine Clearance: 24.3 mL/min (A) (by C-G formula based on SCr of 2.58 mg/dL (H)).  Assessment: 58 yr old male on warfarin PTA for hx St Jude MVR + Afib - admitted with AKI and hyperkalemia requiring line replacement and start of CRRT. PTA warfarin dose: 5mg  daily  Warfarin has been held since Sunday d/t rising INR - peaked at 7.5, now 1.7  Pharmacy asked to resume warfarin this evening.  Has NG tube for enteral dosing.  PTA warfarin dosing 5 mg daily except 2.5 mg Tu, Th, Sun.  Heparin level this evening slightly below goal.  No overt bleeding or complications noted.  Goal of Therapy:  INR 2.5-3.5 HL 0.3 - 0.7 Monitor platelets by anticoagulation protocol: Yes   Plan:  Coumadin 2.5 mg po x 1 tonight.  Given exaggerated response earlier this admission, will dose with caution initially. Daily INR. Continue heparin until INR  > 2.5. Increase IV heparin to 1700 units/hr Heparin level in 8 hrs. Daily heparin level and CBC.  Marguerite Olea, [facility] Clinical Pharmacist Phone [phone]  09/01/2019 10:28 PM

## 2019-09-01 NOTE — Progress Notes (Signed)
NAME:  Lance Cook, MRN:  [id number], DOB:  1961/03/01, LOS: 72 ADMISSION DATE:  08/20/2019, CONSULTATION DATE:  08/26/2019 REFERRING MD:  Dr Sherry Ruffing, CHIEF COMPLAINT:  Hyperkalemia    Brief History   58 yo male with PMH significant for Bentall procedure, MVR, Afib, CAD, systolic and diastolic heart failure admitted with bradycardia and found to have severe life-threatening hyperkalemia with AKI and dig toxicity, PCCM consulted for admission and need for urgent iHD. Hospital course included mechanical ventilation 12/18-12/23.    History of present illness   58 year old man, history of Bentall procedure, MVR, atrial fibrillation, CAD with chronic systolic and diastolic CHF.  Came to the ER with fatigue, weakness and hypotension.  He was found to be profoundly bradycardic with severe left bundle branch block and QRS widening on EKG.  Found to be severely hyperkalemic with new acute renal failure.  He is on digoxin at baseline and his digoxin level was 2.6.  He will be admitted emergently to the ICU for hemodialysis. He was given digibind for dig toxicity.   Significant Hospital Events   12/17 Admitted, urgent HD 12/18 had HD catheter in carotid removed in OR. Returned intubated 12/23 Extubated  Consults:  Nephrology Heart Failure  Procedures:  R IJ HD catheter 12/17 >> Misplaced in carotid, removed in OR 12/18 L IJ HD catheter 12/17 >>  ETI 12/18>>12/23  Significant Diagnostic Tests:  NM cardiac amyloid scan 10/21 >> equivocal for any evidence amyloidosis  ECHO 12/18 > LVEF 30 to 35%. No left ventricular hypertrophy. RV has normal systolic function. The mitral valve has been repaired/replaced. Mild mitral valve regurgitation. Moderate mitral stenosis. Tricuspid valve regurgitation mild-moderate. 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  S/P Bentall.  Moderately elevated pulmonary artery systolic pressure. . A pacer wire is visualized in the RV and RA. Right atrial pressure of 15  mmHg. History of mechanical aortic and mitral valves. Leaflets not well visualized, could consider fluroscopy of leaflets, but gradients not suggestive of obstruction.  CXR 12/17 > New left internal jugular central line tip in the SVC above the right atrium. No pneumothorax. No other change.  Micro Data:  COVID 12/17 > negative  Blood cultures 12/17 >NGTD   MRSA PCR 12/17 >negative  Urine culture 12/18 >  Negative  Antimicrobials:  Cefazolin 12/18 SCIP  Vanco 12/30 >> Cefepime 12/30 >>  Interim history/subjective:  Doing well this morning, up to chair. His main complaint is mouth dryness and thirst.  Objective   Blood pressure 119/62, pulse (!) 109, temperature 100.3 F (37.9 C), temperature source Axillary, resp. rate (!) 34, height 5' 2.21" (1.58 m), weight 60.1 kg, SpO2 97 %. CVP:  [5 mmHg-12 mmHg] 8 mmHg  FiO2 (%):  [30 %] 30 %   Intake/Output Summary (Last 24 hours) at 09/01/2019 1057 Last data filed at 09/01/2019 1000 Gross per 24 hour  Intake 2464 ml  Output 3480 ml  Net -1016 ml   Filed Weights   08/30/19 0426 08/31/19 0500 09/01/19 0500  Weight: 69.7 kg 61 kg 60.1 kg   Examination: General: frail-appearing elderly man sitting up in the chair Neck: R linear scar healing well w/o erythema or drainage HEENT: [place]/AT, eyes anicteric Neuro: alert, tracking, not talking but nodding to answer questions. Globally weak. CV:  [id number], mechanical click. JVD PULM: breathing comfortably on [place], no tachypnea, decreased basilar breath sounds, otherwise CTA GI: soft, NT, ND Extremities: dependent LE edema Skin: chronic stasis changes b/l shins, no rashes  Resolved Hospital Problem  list   Coagulopathy with elevated INR Hyperkalemia Digoxin Toxicity  Assessment & Plan:   Acute hypoxemic respiratory failure, improved with diuresis. CXR with likely b/l dependent effusions, possibly RLL pneumonia.  -Extubated 12/23 Plan: -pulmonary hygiene- IS, mobility -nebs PRN -con't  diuresis -Bronchodilators available -Diuresis -con't cefepime for presumed RLL HAP; con't to follow blood cultures -Con't supplemental O2, titrate down as able.  In Dr. Cordelia Pen previous discussions with patient, he does not want reintubation/ mechanical ventilator, even if that means he would not survive this hospitalization.  We have reached out to his wife to ask her to come in, updated he acutely decompensated overnight and now on continuous Bipap. We are supporting him by starting abx for PNA and giving lasix.   Right neck hematoma secondary to a right IJ hemodialysis catheter placement -Status post intervention per vascular surgery 08/20/2019 Plan: -con't to monitor wound- appears stable  Acute encephalopathy, resolved Plan: -delirium precautions -mobility  AKI on CKD 3- possible cardiorenal syndrome - S/p CRRT 12/18-12/24  - Etiology of his renal failure likely includes use of diuretics, poor p.o. intake, shock, chronic heart failure Plan -appreciate nephro's assistance -con't to monitor I/O -daily renal labs -avoid nephrotoxic meds & renally dose meds  Cardiogenic Shock Acute on Chronic systolic and diastolic CHF (EF [id number]). S/p resynchronization therapy with left pacing lead, but turned off 2/2 diaphragmatic stimulation. CAD Mechanical mitral valve Chronic atrial fibrillation H/o VT s/p AICD/pacer  P: -Con't coumadin dosing per pharmacy.  INR reversed yesterday for CVC placement. Heparin drip until INR therapeutic.   -Continue amiodarone -Goal K>4, Mg>2- additional repletion given -Midodrine, decreased dose 12/28.  Titration per HF team -Stress dose steroids- d/c today -con't to monitor on tele   DM type 2 Plan -accuchecks Q4h with SSI  Anemia Plan -transfuse for Hb <7 -iron infusion today  Deconditioning -chronic and critical illness -appreciate PT & OT's assistance  Hypernatremia- worsening --increased FWF per nephro -repeat labs 5PM    Leukocytosis Low grade temp --follow cultures --con't empiric Cefepime   Best practice:  Diet: tube feeds per recs: Currently on hold. Pain/Anxiety/Delirium protocol (if indicated): na VAP protocol (if indicated): na DVT prophylaxis: warfarin, pharmacy to dose. Heparin drip GI prophylaxis: PPI Glucose control: SSI Mobility: PT/OT Code Status: Full, pending GOC discussions with wife and patient.  Family Engagement: will update wife  Disposition: ICU  Labs   CBC: Recent Labs  Lab 08/28/19 0505 08/29/19 0529 08/30/19 0345 08/31/19 0416 08/31/19 0447 09/01/19 0513  WBC 17.0* 19.4* 26.2*  --  22.0* 21.9*  HGB 8.0* 8.7* 9.1* 9.9* 8.8* 8.6*  HCT 28.5* 30.8* 32.3* 29.0* 31.7* 31.3*  MCV 74.6* 75.3* 75.8*  --  78.7* 80.7  PLT 170 229 274  --  244 [id number]    Basic Metabolic Panel: Recent Labs  Lab 08/28/19 0505 08/29/19 0529 08/30/19 0345 08/31/19 0416 08/31/19 0447 08/31/19 1542 09/01/19 0513  NA 143 148* 151* 150* 151* 154* 158*  K 3.8 4.5 3.7 4.0 3.9 3.2* 2.5*  CL 104 106 108  --  109 112* 109  CO2 31 31 31   --  31 32 38*  GLUCOSE 143* 122* 117*  --  113* 129* 111*  BUN 82* 94* 112*  --  118* 125* 120*  CREATININE 1.93* 2.12* 2.39*  --  2.55* 2.60* 2.49*  CALCIUM 9.1 9.5 9.4  --  9.2 8.9 8.8*  MG 2.5* 2.7* 2.8*  --  2.9*  --  2.4  PHOS 4.0 4.6 4.2  --  5.5*  --  5.3*   GFR: Estimated Creatinine Clearance: 25.2 mL/min (A) (by C-G formula based on SCr of 2.49 mg/dL (H)). Recent Labs  Lab 08/29/19 0529 08/30/19 0345 08/31/19 0447 09/01/19 0513  PROCALCITON  --   --  0.40  --   WBC 19.4* 26.2* 22.0* 21.9*    Liver Function Tests: Recent Labs  Lab 08/26/19 2021 08/28/19 0505 08/29/19 0529 08/30/19 0345 08/31/19 0447 09/01/19 0513  AST 11*  --   --   --   --   --   ALT 18  --   --   --   --   --   ALKPHOS 66  --   --   --   --   --   BILITOT 0.2*  --   --   --   --   --   PROT 5.0*  --   --   --   --   --   ALBUMIN 2.0* 1.9* 2.2* 2.2* 2.2* 2.1*     ABG    Component Value Date/Time   PHART 7.435 08/31/2019 0416   PCO2ART 52.0 (H) 08/31/2019 0416   PO2ART 95.0 08/31/2019 0416   HCO3 35.1 (H) 08/31/2019 0416   TCO2 37 (H) 08/31/2019 0416   ACIDBASEDEF 1.0 01/26/2015 0425   O2SAT 75.2 09/01/2019 0513     Coagulation Profile: Recent Labs  Lab 08/29/19 0529 08/30/19 0345 08/30/19 1422 08/31/19 0447 09/01/19 0513  INR 4.7* 7.5* 5.9* 2.2* 1.7*    CBG: Recent Labs  Lab 08/31/19 1121 08/31/19 1708 08/31/19 1844 08/31/19 2327 09/01/19 0517  GLUCAP 108* 105* 114* 136* 99     This patient is critically ill with multiple organ system failure which requires frequent high complexity decision making, assessment, support, evaluation, and titration of therapies. This was completed through the application of advanced monitoring technologies and extensive interpretation of multiple databases. During this encounter critical care time was devoted to patient care services described in this note for 35 minutes.  Julian Hy, DO 09/01/19 11:32 AM [facility]

## 2019-09-01 NOTE — Progress Notes (Addendum)
[facility] Progress Note Patient Name: Sebastyan Pinkhasov DOB: Oct 22, 1960 MRN: [id number]   Date of Service  09/01/2019  HPI/Events of Note  K+ 2.5, GFR 27  eICU Interventions  KCl  40 meq via tube x 1, BMET at 10 a.m.        Kerry Kass Naelle Diegel 09/01/2019, 6:16 AM

## 2019-09-01 NOTE — Progress Notes (Signed)
Physical Therapy Treatment Patient Details Name: Lance Cook MRN: [id number] DOB: Dec 01, 1960 Today's Date: 09/01/2019    History of Present Illness 58 y.o. yo male with h/o CHF (EF 30%), mechanical mitral and aortic valve A. fib, s/p VT arrest, ICD, DM, Bell's palsy, CVA came to the ER 08/20/2019 with fatigue, weakness and hypotension; potassium of 8.4 associated with hypotension and AKI. IJ catheter placed for HD (went into rt carotid artery and required surgery for artery repair); 12/18 remained intubated after surgery, began CRRT; extubated 08/24/19    PT Comments    Pt making slow progress. Continue to recommend SNF.    Follow Up Recommendations  SNF;Supervision/Assistance - 24 hour     Equipment Recommendations  Other (comment)(TBD at next venue)    Recommendations for Other Services       Precautions / Restrictions Precautions Precautions: Fall;Other (comment) Precaution Comments: lines    Mobility  Bed Mobility Overal bed mobility: Needs Assistance Bed Mobility: Supine to Sit     Supine to sit: Mod assist     General bed mobility comments: Assist to bring pt's trunk forward into long sitting in bed to remove maxisky pad  Transfers Overall transfer level: Needs assistance Equipment used: Rolling walker (2 wheeled) Transfers: Sit to/from Stand Sit to Stand: +2 physical assistance;Max assist         General transfer comment: Assist to bring hips up and for balance. Used bed pad under hips. Pt with flexed standing posture.  Ambulation/Gait                 Stairs             Wheelchair Mobility    Modified Rankin (Stroke Patients Only)       Balance Overall balance assessment: Needs assistance Sitting-balance support: Bilateral upper extremity supported;Feet supported Sitting balance-Leahy Scale: Poor Sitting balance - Comments: UE assist and min guard for static standing   Standing balance support: Bilateral upper extremity  supported Standing balance-Leahy Scale: Zero Standing balance comment: +2 mod/max for flexed stand x 25 sec.                            Cognition Arousal/Alertness: Awake/alert Behavior During Therapy: Flat affect Overall Cognitive Status: Difficult to assess                         Following Commands: Follows one step commands with increased time;Follows one step commands consistently       General Comments: Pt very difficult to understand due to low volume      Exercises      General Comments General comments (skin integrity, edema, etc.): RR high 30's/low 40's      Pertinent Vitals/Pain Pain Assessment: Faces Faces Pain Scale: No hurt    Home Living                      Prior Function            PT Goals (current goals can now be found in the care plan section) Acute Rehab PT Goals Patient Stated Goal: none stated; wife wants pt to come home Progress towards PT goals: Progressing toward goals    Frequency    Min 2X/week      PT Plan Current plan remains appropriate    Co-evaluation              AM-PAC PT "  6 Clicks" Mobility   Outcome Measure  Help needed turning from your back to your side while in a flat bed without using bedrails?: A Little Help needed moving from lying on your back to sitting on the side of a flat bed without using bedrails?: A Lot Help needed moving to and from a bed to a chair (including a wheelchair)?: Total Help needed standing up from a chair using your arms (e.g., wheelchair or bedside chair)?: Total Help needed to walk in hospital room?: Total Help needed climbing 3-5 steps with a railing? : Total 6 Click Score: 9    End of Session Equipment Utilized During Treatment: Oxygen Activity Tolerance: Patient limited by fatigue Patient left: with call bell/phone within reach;in bed;with bed alarm set Nurse Communication: Need for lift equipment PT Visit Diagnosis: Muscle weakness  (generalized) (M62.81);Difficulty in walking, not elsewhere classified (R26.2)     Time: 1340-1404 PT Time Calculation (min) (ACUTE ONLY): 24 min  Charges:  $Therapeutic Activity: 23-37 mins                     [place] Pager [phone] Office [place] 09/01/2019, 6:08 PM

## 2019-09-01 NOTE — Progress Notes (Signed)
ANTICOAGULATION CONSULT NOTE - Follow Up Consult  Pharmacy Consult for Heparin, resume warfarin Indication: Hx St Jude MVR/AVR + Afib  Patient Measurements: Height: 5' 2.21" (158 cm) Weight: 132 lb 7.9 oz (60.1 kg) IBW/kg (Calculated) : 55.07 Heparin Dosing Weight: 70 kg  Vital Signs: Temp: 98.4 F (36.9 C) (12/31 1515) Temp Source: Axillary (12/31 1515) BP: 106/70 (12/31 1500) Pulse Rate: 116 (12/31 1500)  Labs: Recent Labs    08/30/19 0345 08/30/19 1422 08/31/19 0416 08/31/19 0447 08/31/19 1542 09/01/19 0513 09/01/19 1004 09/01/19 1305  HGB 9.1*  --  9.9* 8.8*  --  8.6*  --   --   HCT 32.3*  --  29.0* 31.7*  --  31.3*  --   --   PLT 274  --   --  244  --  218  --   --   LABPROT 64.3* 53.2*  --  24.6*  --  20.3*  --   --   INR 7.5* 5.9*  --  2.2*  --  1.7*  --   --   HEPARINUNFRC  --   --   --   --  <0.10* <0.10*  --  <0.10*  CREATININE 2.39*  --   --  2.55* 2.60* 2.49* 2.44*  --     Estimated Creatinine Clearance: 25.7 mL/min (A) (by C-G formula based on SCr of 2.44 mg/dL (H)).  Assessment: 58 yr old male on warfarin PTA for hx St Jude MVR + Afib - admitted with AKI and hyperkalemia requiring line replacement and start of CRRT. PTA warfarin dose: 5mg  daily  Warfarin has been held since Sunday d/t rising INR - peaked at 7.5, now 1.7  Pharmacy asked to resume warfarin this evening.  Has NG tube for enteral dosing.  PTA warfarin dosing 5 mg daily except 2.5 mg Tu, Th, Sun.  Goal of Therapy:  INR 2.5-3.5 HL 0.3 - 0.7 Monitor platelets by anticoagulation protocol: Yes   Plan:  Coumadin 2.5 mg po x 1 tonight.  Given exaggerated response earlier this admission, will dose with caution initially. Daily INR. Continue heparin until INR > 2.5.  Marguerite Olea, [facility] Clinical Pharmacist Phone [phone]  09/01/2019 4:00 PM

## 2019-09-01 NOTE — Progress Notes (Signed)
Pt has order for BIPAP QHS. BIPAP is contraindicated at this time for this pt as he has an NG in place w/tube feeds running. Pt states he is okay and not in need of at this time. RT will continue to monitor.

## 2019-09-01 NOTE — Progress Notes (Addendum)
Advanced Heart Failure Rounding Note   Subjective:    CVVHD stopped 12/24. Transitioned back to IV lasix after CVVHD discontinuation but developed worsening renal function and diuretics held which lead to progressive volume overload and hypoxic respiratory failure and placed on BIPAP. Pt refusing Intubation. Partial CODE. IV Lasix resumed 12/30 120 mg q8H with plans to resume CRRT if poor response.   Good response past 24 hrs, -3.3L out. CVP 8-9. SCr improving, down from 2.60>>2.49. BUN 125>>120. K 2.5. Na 158.   Co-ox 75%  Now off BiPAP. On [place] 2L/min. Out of bed and in chair.   INR 1.7.  Objective:   Weight Range:  Vital Signs:   Temp:  [97.8 F (36.6 C)-100.3 F (37.9 C)] 100.3 F (37.9 C) (12/31 0753) Pulse Rate:  [91-108] 100 (12/31 0818) Resp:  [22-41] 41 (12/31 0830) BP: (89-123)/(43-77) 117/67 (12/31 0818) SpO2:  [97 %-100 %] 100 % (12/31 0830) FiO2 (%):  [30 %] 30 % (12/30 1456) Weight:  [60.1 kg] 60.1 kg (12/31 0500) Last BM Date: 08/31/19  Weight change: Filed Weights   08/30/19 0426 08/31/19 0500 09/01/19 0500  Weight: 69.7 kg 61 kg 60.1 kg    Intake/Output:   Intake/Output Summary (Last 24 hours) at 09/01/2019 0910 Last data filed at 09/01/2019 0600 Gross per 24 hour  Intake 1856.5 ml  Output 3280 ml  Net -1423.5 ml     Physical Exam: CVP 8-9  General:  Critically ill, frail WM. Out of bed and in chair.  HEENT: normal Neck: supple. Elevated JVD. R neck wound. Lt IJ CVC. Carotids 2+ bilat; no bruits. No lymphadenopathy or thryomegaly appreciated. Cor: PMI nondisplaced. Irregular rhythm, tachy rate. + mechanical valve sounds Lungs: CTAB anteriorly  Abdomen: soft, nontender, mildly distended. No hepatosplenomegaly. No bruits or masses. Good bowel sounds. Extremities: no cyanosis, clubbing, rash, trace bilateral LE edema w/ chronic venous stasis dermatitis  Neuro: alert & orientedx3, cranial nerves grossly intact. moves all 4 extremities w/o  difficulty. Affect flat   Telemetry: A fib low 100s, NSVT Personally reviewed   Labs: Basic Metabolic Panel: Recent Labs  Lab 08/28/19 0505 08/29/19 0529 08/30/19 0345 08/31/19 0416 08/31/19 0447 08/31/19 1542 09/01/19 0513  NA 143 148* 151* 150* 151* 154* 158*  K 3.8 4.5 3.7 4.0 3.9 3.2* 2.5*  CL 104 106 108  --  109 112* 109  CO2 31 31 31   --  31 32 38*  GLUCOSE 143* 122* 117*  --  113* 129* 111*  BUN 82* 94* 112*  --  118* 125* 120*  CREATININE 1.93* 2.12* 2.39*  --  2.55* 2.60* 2.49*  CALCIUM 9.1 9.5 9.4  --  9.2 8.9 8.8*  MG 2.5* 2.7* 2.8*  --  2.9*  --  2.4  PHOS 4.0 4.6 4.2  --  5.5*  --  5.3*    Liver Function Tests: Recent Labs  Lab 08/26/19 2021 08/28/19 0505 08/29/19 0529 08/30/19 0345 08/31/19 0447 09/01/19 0513  AST 11*  --   --   --   --   --   ALT 18  --   --   --   --   --   ALKPHOS 66  --   --   --   --   --   BILITOT 0.2*  --   --   --   --   --   PROT 5.0*  --   --   --   --   --  ALBUMIN 2.0* 1.9* 2.2* 2.2* 2.2* 2.1*   No results for input(s): LIPASE, AMYLASE in the last 168 hours. Recent Labs  Lab 08/26/19 2021  AMMONIA 28    CBC: Recent Labs  Lab 08/28/19 0505 08/29/19 0529 08/30/19 0345 08/31/19 0416 08/31/19 0447 09/01/19 0513  WBC 17.0* 19.4* 26.2*  --  22.0* 21.9*  HGB 8.0* 8.7* 9.1* 9.9* 8.8* 8.6*  HCT 28.5* 30.8* 32.3* 29.0* 31.7* 31.3*  MCV 74.6* 75.3* 75.8*  --  78.7* 80.7  PLT 170 229 274  --  244 218    Cardiac Enzymes: No results for input(s): CKTOTAL, CKMB, CKMBINDEX, TROPONINI in the last 168 hours.  BNP: BNP (last 3 results) Recent Labs    07/19/19 0956 08/02/19 0957 08/04/2019 1424  BNP 298.7* 167.3* 106.1*    ProBNP (last 3 results) No results for input(s): PROBNP in the last 8760 hours.    Other results:  Imaging: DG Chest Portable 1 View  Result Date: 08/31/2019 CLINICAL DATA:  Central line placement EXAM: PORTABLE CHEST 1 VIEW COMPARISON:  08/31/2019 at 7:33 a.m. FINDINGS: A nasogastric  tube enters the stomach. Pacer device noted. Prosthetic aortic valve. A left internal jugular central line is visualized. This terminates in the lower SVC. No pneumothorax or complicating feature. Low lung volumes. Indistinct airspace opacity in the right mid lung and right lung base, as well as the left retrocardiac region. The right lateral ribs are inadvertently excluded. Thoracic spondylosis. IMPRESSION: 1. Left IJ line tip: SVC.  No pneumothorax. 2. Bibasilar and right mid lung airspace opacities potentially from atelectasis or pneumonia. 3. Otherwise stable. Electronically Signed   By: Van Clines M.D.   On: 08/31/2019 19:32   DG CHEST PORT 1 VIEW  Result Date: 08/31/2019 CLINICAL DATA:  Tachypnea EXAM: PORTABLE CHEST 1 VIEW COMPARISON:  Three days ago FINDINGS: Enteric tube with tip over the stomach. Biventricular pacer from the left with leads in unremarkable position. The left IJ line has been removed. Prior median sternotomy with mitral valve replacement low volume chest with asymmetric haziness on the right. No generalized Kerley lines or pneumothorax. IMPRESSION: 1. Mild improvement in aeration. 2. Asymmetric airspace opacity greater on the right. This could be pneumonia in the setting of rising white count. Electronically Signed   By: Monte Fantasia M.D.   On: 08/31/2019 08:01     Medications:     Scheduled Medications: . sodium chloride   Intravenous Once  . sodium chloride   Intravenous Once  . albuterol  2.5 mg Nebulization TID  . amiodarone  200 mg Per Tube BID  . chlorhexidine  15 mL Mouth Rinse BID  . Chlorhexidine Gluconate Cloth  6 each Topical Q0600  . darbepoetin (ARANESP) injection - NON-DIALYSIS  100 mcg Subcutaneous Q Wed-1800  . feeding supplement (PRO-STAT SUGAR FREE 64)  30 mL Per Tube BID  . free water  50 mL Per Tube Q1H  . furosemide  80 mg Intravenous Q8H  . hydroxypropyl methylcellulose / hypromellose  1 drop Both Eyes TID  . insulin aspart  2-6 Units  Subcutaneous Q6H  . mouth rinse  15 mL Mouth Rinse q12n4p  . midodrine  2.5 mg Oral TID WC  . pantoprazole sodium  40 mg Per Tube Daily    Infusions: . sodium chloride Stopped (09/01/19 0527)  . ceFEPime (MAXIPIME) IV Stopped (08/31/19 1258)  . feeding supplement (JEVITY 1.5 CAL/FIBER) 20 mL/hr at 09/01/19 0144  . ferric gluconate (FERRLECIT/NULECIT) IV Stopped (08/31/19 1132)  . heparin  1,400 Units/hr (09/01/19 0640)  . norepinephrine (LEVOPHED) Adult infusion Stopped (08/30/19 0929)    PRN Medications: sodium chloride, acetaminophen, albuterol, bisacodyl, heparin, lip balm, ondansetron (ZOFRAN) IV, polyethylene glycol, sennosides   Assessment/Plan:   1. AKI on CKD stage 3: Baseline creatinine 1.4-1.6 Creatinine up to 2.7 at admission with marked hyperkalemia and hyponatremia in setting of use of torsemide, metolazone, and spironolactone at home.  He had urgent HD with improvement.  - CVVHD stopped 12/24. Creatinine increased to 2.6 and diuretics temporarily held for worsening renal function. IV diuretics resumed 12/30 for worsening volume overload. - good response to IV Lasix 120 q8h. -3.3 L out past 24 hrs. Respiratory status improved, now off bipap and on 2L [place]. CVP 8-9 today. SCr improving w/ diuresis, down from 2.6>>2.49. Continue IV lasix for now (dose has been decreased to 80 mg q8h), if diuresis slows may need to resume CVVHD.  Nephrology following.  2. Acute on chronic systolic CHF/cardiogenic shock: EF 30-35% on last echo.  [employer] CRT-D, LV not on due to diaphragmatic stimulation.  Hypotension in setting of AKI/hyperkalemia. - Co-ox 75% - remains volume overloaded. Good response to high dose lasix 120 mg q8H per above. CVP 8-9 today. Continue IV Lasix, now 80 mg q8. Monitor volume status. Resum CVVHD if needed.  - Off NE. On midodrine 2.5 tid for BP support.  - Will not restart HF meds yet 3. VT: Patient has [employer] CRT-D device, LV lead off due to  diaphragmatic pacing.  Patient has has runs of VT, treated w/ amiodarone gtt.  - VT quiescent - Continue amio at 200 po bid - Keep K > 4.0 and Mg > 2.0 4. Digoxin toxicity: Severely elevated digoxin level, has had digibind and HD.  - bradycardia improved 5. CAD: Last cath in 2016 with patent native coronaries, probably occluded SVG-RCA.  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  -on IV heparin. INR 1.7 7. Atrial fibrillation: Chronic.   - rate in the 110s currently.  - Continue po amio  8. Acute hypoxic respiratory failure -Extubated, now on [place]. Pt refusing future re-intubation  -continue IV diuretics for CHF - abx for ? PNA => cefepime 9. Ab pain - improving. KUB ok 10. Debility, severe -PT on hold given current status 11. Hypernatremia: Na 158 - managment per nephrology  12. Leukocytosis: WBC 22 but also on hydrocortisone for shock. Spiked temp at 100.5. CXR 12/30 showed possible PNA. PTC 0.40. On cefepime. Blood cultures NGTD. 13. Hypokalemia: K 2.5 2/2 IV diuretics. Mg 2.4 - diuretic dose decrease per above (reduced from 120 tid>>80 tid) - needs additional K. Only 40 mEq per tube given today.   Length of Stay: [place] PA-C 09/01/2019, 9:10 AM  Advanced Heart Failure Team Pager [phone] (M-F; 7a - 4p)  Please contact [facility] for night-coverage after hours (4p -7a ) and weekends on [email or web address]  Patient seen with PA, agree with the above note.   CVP 8 today on my measure.  Looks somewhat better.  I/Os net negative 1275.  Co-ox 75%.    General: NAD, weak Neck: JVP 8 cm, no thyromegaly or thyroid nodule.  Lungs: Clear to auscultation bilaterally with normal respiratory effort. CV: Nondisplaced PMI.  Heart irregular with mechanical S1/S2, no S3/S4, 2/6 SEM RUSB.  Trace ankle edema.   Abdomen: Soft, nontender, no hepatosplenomegaly, no distention.  Skin: Venous stasis changes lower legs.  Neurologic: Awake/alert.  Psych: Normal  affect. Extremities:  No clubbing or cyanosis.  HEENT: Normal.   BUN/creatinine mildly lower today, good UOP. Weight down.  CVP 8-9 on my measure. Good co-ox.  - Will decrease Lasix to 80 mg IV bid to avoid overshooting.  - Continue to follow renal function, hopefully creatinine has plateaued.  - Continue midodrine at currrent dose.   He is not taking po, will continue heparin gtt while off warfarin.   Getting free water with hypernatremia.   Loralie Champagne 09/01/2019 4:05 PM

## 2019-09-01 NOTE — Progress Notes (Signed)
[facility] Progress Note    Assessment/ Plan:   Pt is a 58 y.o. yo male CHF, mechanical mitral and aortic valve A. fib, s/p VT arrest, DM, potassium of 8.4 associated with hypotension and AKI on admission.  #Severe hyperkalemia with EKG changes due to use of spironolactone, KCl and AKI: Received urgent dialysis on 12/17, started CRRT on 12/18 for fluid overload- stopped on 12/24.  Resolved.   #Acute on CKD stage III: Baseline creatinine around 1.3-1.6, SCr worseing.  Unclear if this is cardiorenal or not, but likley.  Prognosis is increasingly poor.  Good UOP with IV Lasix but Na up, will decrease Lasix to 80 TID and add a little FWF.  Cr overall not worse   # Hypernatremia, originally sig hyponatremia.  Worsened--> as above  #Acute on Chronic CHF, aortic and mitral valve replacement: S/p UF with CRRT.  UOP ok.  AHF following and as above  #Hypotension/shock: Not on pressors currenlty. Midodrine  + hydrocortisone  #Acute respiratory failure/VDRF: worsened 12/30, on BiPAP. Per CCM  Anemia- needs replete iron-  Has allergy listed, Tolerated test dose, getting Fe.   Coagulopathy, improved  ? PNA, per CCM  Subjective:     > 3L UOP yesterday, Na up to 158 now this AM.  Sitting in up in chair.   Objective:   BP 119/62   Pulse (!) 109   Temp 100.3 F (37.9 C) (Axillary)   Resp (!) 34   Ht 5' 2.21" (1.58 m)   Wt 60.1 kg   SpO2 97%   BMI 24.07 kg/m   Intake/Output Summary (Last 24 hours) at 09/01/2019 1029 Last data filed at 09/01/2019 1000 Gross per 24 hour  Intake 2464 ml  Output 3480 ml  Net -1016 ml   Weight change: -0.9 kg  Physical Exam: Gen: cachectic, severe temporal wasting CVS: tachycardic, no m/r/g Resp: clear bilaterally  Abd: nontender Ext: 1+ RUE edema  Imaging: DG Chest Portable 1 View  Result Date: 08/31/2019 CLINICAL DATA:  Central line placement EXAM: PORTABLE CHEST 1 VIEW COMPARISON:  08/31/2019 at 7:33 a.m. FINDINGS: A  nasogastric tube enters the stomach. Pacer device noted. Prosthetic aortic valve. A left internal jugular central line is visualized. This terminates in the lower SVC. No pneumothorax or complicating feature. Low lung volumes. Indistinct airspace opacity in the right mid lung and right lung base, as well as the left retrocardiac region. The right lateral ribs are inadvertently excluded. Thoracic spondylosis. IMPRESSION: 1. Left IJ line tip: SVC.  No pneumothorax. 2. Bibasilar and right mid lung airspace opacities potentially from atelectasis or pneumonia. 3. Otherwise stable. Electronically Signed   By: Van Clines M.D.   On: 08/31/2019 19:32   DG CHEST PORT 1 VIEW  Result Date: 08/31/2019 CLINICAL DATA:  Tachypnea EXAM: PORTABLE CHEST 1 VIEW COMPARISON:  Three days ago FINDINGS: Enteric tube with tip over the stomach. Biventricular pacer from the left with leads in unremarkable position. The left IJ line has been removed. Prior median sternotomy with mitral valve replacement low volume chest with asymmetric haziness on the right. No generalized Kerley lines or pneumothorax. IMPRESSION: 1. Mild improvement in aeration. 2. Asymmetric airspace opacity greater on the right. This could be pneumonia in the setting of rising white count. Electronically Signed   By: Monte Fantasia M.D.   On: 08/31/2019 08:01    Labs: BMET Recent Labs  Lab 08/26/19 0356 08/27/19 0459 08/28/19 0505 08/29/19 0529 08/30/19 0345 08/31/19 0416 08/31/19 0447 08/31/19 1542 09/01/19  0513  NA 135 138 143 148* 151* 150* 151* 154* 158*  K 4.1 4.5 3.8 4.5 3.7 4.0 3.9 3.2* 2.5*  CL 100 102 104 106 108  --  109 112* 109  CO2 25 29 31 31 31   --  31 32 38*  GLUCOSE 115* 149* 143* 122* 117*  --  113* 129* 111*  BUN 48* 67* 82* 94* 112*  --  118* 125* 120*  CREATININE 1.82* 1.83* 1.93* 2.12* 2.39*  --  2.55* 2.60* 2.49*  CALCIUM 8.4* 8.5* 9.1 9.5 9.4  --  9.2 8.9 8.8*  PHOS 3.5 4.2 4.0 4.6 4.2  --  5.5*  --  5.3*    CBC Recent Labs  Lab 08/29/19 0529 08/30/19 0345 08/31/19 0416 08/31/19 0447 09/01/19 0513  WBC 19.4* 26.2*  --  22.0* 21.9*  HGB 8.7* 9.1* 9.9* 8.8* 8.6*  HCT 30.8* 32.3* 29.0* 31.7* 31.3*  MCV 75.3* 75.8*  --  78.7* 80.7  PLT 229 274  --  244 218    Medications:    . sodium chloride   Intravenous Once  . sodium chloride   Intravenous Once  . albuterol  2.5 mg Nebulization TID  . amiodarone  200 mg Per Tube BID  . chlorhexidine  15 mL Mouth Rinse BID  . Chlorhexidine Gluconate Cloth  6 each Topical Q0600  . darbepoetin (ARANESP) injection - NON-DIALYSIS  100 mcg Subcutaneous Q Wed-1800  . feeding supplement (PRO-STAT SUGAR FREE 64)  30 mL Per Tube BID  . free water  50 mL Per Tube Q1H  . furosemide  80 mg Intravenous Q8H  . hydroxypropyl methylcellulose / hypromellose  1 drop Both Eyes TID  . insulin aspart  2-6 Units Subcutaneous Q6H  . mouth rinse  15 mL Mouth Rinse q12n4p  . midodrine  2.5 mg Oral TID WC  . pantoprazole sodium  40 mg Per Tube Daily      Madelon Lips, MD [facility] pgr [phone] 09/01/2019, 10:29 AM

## 2019-09-01 NOTE — Progress Notes (Signed)
ANTICOAGULATION CONSULT NOTE  Pharmacy Consult for Heparin Indication: Hx St Jude MVR/AVR + Afib  Patient Measurements: Height: 5' 2.21" (158 cm) Weight: 132 lb 7.9 oz (60.1 kg) IBW/kg (Calculated) : 55.07 Heparin Dosing Weight: 70 kg  Vital Signs: Temp: 98.2 F (36.8 C) (12/31 0333) Temp Source: Oral (12/31 0333) BP: 112/68 (12/31 0600) Pulse Rate: 108 (12/31 0600)  Labs: Recent Labs    08/30/19 0345 08/30/19 1422 08/31/19 0416 08/31/19 0447 08/31/19 1542 09/01/19 0513  HGB 9.1*  --  9.9* 8.8*  --  8.6*  HCT 32.3*  --  29.0* 31.7*  --  31.3*  PLT 274  --   --  244  --  218  LABPROT 64.3* 53.2*  --  24.6*  --  20.3*  INR 7.5* 5.9*  --  2.2*  --  1.7*  HEPARINUNFRC  --   --   --   --  <0.10* <0.10*  CREATININE 2.39*  --   --  2.55* 2.60* 2.49*    Estimated Creatinine Clearance: 25.2 mL/min (A) (by C-G formula based on SCr of 2.49 mg/dL (H)).  Assessment: 58 yr old male with hx St Jude MVR/AVR and Afib, Coumadin on hold, for heparin  Goal of Therapy:  INR 2.5-3.5 HL 0.3 - 0.7 Monitor platelets by anticoagulation protocol: Yes   Plan:  Increase Heparin  1400 units/hr Check heparin level in 8 hours.  Phillis Knack, PharmD, BCPS

## 2019-09-01 NOTE — Progress Notes (Signed)
CRITICAL VALUE ALERT  Critical Value:  k 2.5  Date & Time Notied:  09/01/19 0600  Provider Notified: Warren Lacy  Orders Received/Actions taken: see new orders

## 2019-09-02 ENCOUNTER — Inpatient Hospital Stay (HOSPITAL_COMMUNITY): Payer: Medicare HMO

## 2019-09-02 DIAGNOSIS — A419 Sepsis, unspecified organism: Secondary | ICD-10-CM

## 2019-09-02 DIAGNOSIS — J181 Lobar pneumonia, unspecified organism: Secondary | ICD-10-CM

## 2019-09-02 DIAGNOSIS — R6521 Severe sepsis with septic shock: Secondary | ICD-10-CM

## 2019-09-02 LAB — RENAL FUNCTION PANEL
Albumin: 2 g/dL — ABNORMAL LOW (ref 3.5–5.0)
Anion gap: 9 (ref 5–15)
BUN: 134 mg/dL — ABNORMAL HIGH (ref 6–20)
CO2: 36 mmol/L — ABNORMAL HIGH (ref 22–32)
Calcium: 8.2 mg/dL — ABNORMAL LOW (ref 8.9–10.3)
Chloride: 112 mmol/L — ABNORMAL HIGH (ref 98–111)
Creatinine, Ser: 2.74 mg/dL — ABNORMAL HIGH (ref 0.61–1.24)
GFR calc Af Amer: 28 mL/min — ABNORMAL LOW (ref >60)
GFR calc non Af Amer: 24 mL/min — ABNORMAL LOW (ref >60)
Glucose, Bld: 113 mg/dL — ABNORMAL HIGH (ref 70–99)
Phosphorus: 3.1 mg/dL (ref 2.5–4.6)
Potassium: 4.9 mmol/L (ref 3.5–5.1)
Sodium: 157 mmol/L — ABNORMAL HIGH (ref 135–145)

## 2019-09-02 LAB — COOXEMETRY PANEL
Carboxyhemoglobin: 2.6 % — ABNORMAL HIGH (ref 0.5–1.5)
Methemoglobin: 1.3 % (ref 0.0–1.5)
O2 Saturation: 49.8 %
Total hemoglobin: 10.7 g/dL — ABNORMAL LOW (ref 12.0–16.0)

## 2019-09-02 LAB — CBC
HCT: 31.2 % — ABNORMAL LOW (ref 39.0–52.0)
Hemoglobin: 8.2 g/dL — ABNORMAL LOW (ref 13.0–17.0)
MCH: 22.1 pg — ABNORMAL LOW (ref 26.0–34.0)
MCHC: 26.3 g/dL — ABNORMAL LOW (ref 30.0–36.0)
MCV: 84.1 fL (ref 80.0–100.0)
Platelets: 215 10*3/uL (ref 150–400)
RBC: 3.71 MIL/uL — ABNORMAL LOW (ref 4.22–5.81)
RDW: 27.9 % — ABNORMAL HIGH (ref 11.5–15.5)
WBC: 23.8 10*3/uL — ABNORMAL HIGH (ref 4.0–10.5)
nRBC: 2 % — ABNORMAL HIGH (ref 0.0–0.2)

## 2019-09-02 LAB — PROTIME-INR
INR: 2.3 — ABNORMAL HIGH (ref 0.8–1.2)
Prothrombin Time: 24.8 seconds — ABNORMAL HIGH (ref 11.4–15.2)

## 2019-09-02 LAB — GLUCOSE, CAPILLARY
Glucose-Capillary: 140 mg/dL — ABNORMAL HIGH (ref 70–99)
Glucose-Capillary: 144 mg/dL — ABNORMAL HIGH (ref 70–99)
Glucose-Capillary: 203 mg/dL — ABNORMAL HIGH (ref 70–99)
Glucose-Capillary: 96 mg/dL (ref 70–99)

## 2019-09-02 LAB — HEPARIN LEVEL (UNFRACTIONATED): Heparin Unfractionated: 0.42 IU/mL (ref 0.30–0.70)

## 2019-09-02 LAB — MAGNESIUM: Magnesium: 2.2 mg/dL (ref 1.7–2.4)

## 2019-09-02 MED ORDER — HYDROCORTISONE NA SUCCINATE PF 100 MG IJ SOLR
50.0000 mg | Freq: Four times a day (QID) | INTRAMUSCULAR | Status: DC
  Administered 2019-09-02 – 2019-09-05 (×12): 50 mg via INTRAVENOUS
  Filled 2019-09-02 (×12): qty 2

## 2019-09-02 MED ORDER — NOREPINEPHRINE 4 MG/250ML-% IV SOLN: 0.0000 ug/min | INTRAVENOUS | Status: DC

## 2019-09-02 MED ORDER — WARFARIN SODIUM 2.5 MG PO TABS: 2.5000 mg | ORAL_TABLET | Freq: Once | ORAL | Status: DC

## 2019-09-02 MED ORDER — PHENYLEPHRINE CONCENTRATED 100MG/250ML (0.4 MG/ML) INFUSION SIMPLE
0.0000 ug/min | INTRAVENOUS | Status: DC
  Filled 2019-09-02: qty 250

## 2019-09-02 MED ORDER — AMIODARONE HCL IN DEXTROSE 360-4.14 MG/200ML-% IV SOLN
60.0000 mg/h | INTRAVENOUS | Status: DC
  Administered 2019-09-02 – 2019-09-04 (×4): 30 mg/h via INTRAVENOUS
  Administered 2019-09-04 – 2019-09-05 (×4): 60 mg/h via INTRAVENOUS
  Filled 2019-09-02 (×8): qty 200

## 2019-09-02 MED ORDER — WARFARIN SODIUM 1 MG PO TABS
1.0000 mg | ORAL_TABLET | Freq: Once | ORAL | Status: AC
  Administered 2019-09-02: 1 mg via ORAL
  Filled 2019-09-02: qty 1

## 2019-09-02 MED ORDER — AMIODARONE HCL IN DEXTROSE 360-4.14 MG/200ML-% IV SOLN
60.0000 mg/h | INTRAVENOUS | Status: AC
  Administered 2019-09-02: 60 mg/h via INTRAVENOUS

## 2019-09-02 MED ORDER — FUROSEMIDE 10 MG/ML IJ SOLN
10.0000 mg/h | INTRAVENOUS | Status: DC
  Administered 2019-09-02 – 2019-09-03 (×2): 10 mg/h via INTRAVENOUS
  Filled 2019-09-02 (×2): qty 25

## 2019-09-02 MED ORDER — PHENYLEPHRINE HCL-NACL 10-0.9 MG/250ML-% IV SOLN
0.0000 ug/min | INTRAVENOUS | Status: DC
  Administered 2019-09-02: 125 ug/min via INTRAVENOUS
  Administered 2019-09-02: 20 ug/min via INTRAVENOUS
  Administered 2019-09-02: 125 ug/min via INTRAVENOUS
  Filled 2019-09-02 (×2): qty 250

## 2019-09-02 MED ORDER — AMIODARONE HCL IN DEXTROSE 360-4.14 MG/200ML-% IV SOLN
INTRAVENOUS | Status: AC
  Administered 2019-09-02: 60 mg/h via INTRAVENOUS
  Filled 2019-09-02: qty 200

## 2019-09-02 MED ORDER — NOREPINEPHRINE 16 MG/250ML-% IV SOLN
0.0000 ug/min | INTRAVENOUS | Status: DC
  Administered 2019-09-02: 15 ug/min via INTRAVENOUS
  Administered 2019-09-03: 10 ug/min via INTRAVENOUS
  Administered 2019-09-04: 15 ug/min via INTRAVENOUS
  Filled 2019-09-02 (×4): qty 250

## 2019-09-02 NOTE — Progress Notes (Signed)
NAME:  Lance Cook, MRN:  [id number], DOB:  [birth year], LOS: 75 ADMISSION DATE:  08/19/2019, CONSULTATION DATE:  08/10/2019 REFERRING MD:  Dr Sherry Ruffing, CHIEF COMPLAINT:  Hyperkalemia    Brief History   60 yo male with PMH significant for Bentall procedure, MVR, Afib, CAD, systolic and diastolic heart failure admitted with bradycardia and found to have severe life-threatening hyperkalemia with AKI and dig toxicity, PCCM consulted for admission and need for urgent iHD. Hospital course included mechanical ventilation 12/18-12/23.    History of present illness   59 year old man, history of Bentall procedure, MVR, atrial fibrillation, CAD with chronic systolic and diastolic CHF.  Came to the ER with fatigue, weakness and hypotension.  He was found to be profoundly bradycardic with severe left bundle branch block and QRS widening on EKG.  Found to be severely hyperkalemic with new acute renal failure.  He is on digoxin at baseline and his digoxin level was 2.6.  He will be admitted emergently to the ICU for hemodialysis. He was given digibind for dig toxicity.   Significant Hospital Events   12/17 Admitted, urgent HD 12/18 had HD catheter in carotid removed in OR. Returned intubated 12/23 Extubated  Consults:  Nephrology Heart Failure  Procedures:  R IJ HD catheter 12/17 >> Misplaced in carotid, removed in OR 12/18 L IJ HD catheter 12/17 >>  ETI 12/18>>12/23  Significant Diagnostic Tests:  NM cardiac amyloid scan 10/21 >> equivocal for any evidence amyloidosis  ECHO 12/18 > LVEF 30 to 35%. No left ventricular hypertrophy. RV has normal systolic function. The mitral valve has been repaired/replaced. Mild mitral valve regurgitation. Moderate mitral stenosis. Tricuspid valve regurgitation mild-moderate. 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  S/P Bentall.  Moderately elevated pulmonary artery systolic pressure. . A pacer wire is visualized in the RV and RA. Right atrial pressure of 15  mmHg. History of mechanical aortic and mitral valves. Leaflets not well visualized, could consider fluroscopy of leaflets, but gradients not suggestive of obstruction.  CXR 12/17 > New left internal jugular central line tip in the SVC above the right atrium. No pneumothorax. No other change.  Micro Data:  COVID 12/17 > negative  Blood cultures 12/17 >NGTD   MRSA PCR 12/17 >negative  Urine culture 12/18 >  Negative  Antimicrobials:  Cefazolin 12/18 SCIP  Vanco 12/30 >> Cefepime 12/30 >>  Interim history/subjective:  Febrile, tachycardic, worsening hypotension overnight. Aspirated on liquids, so now NPO.  Objective   Blood pressure (!) 86/61, pulse (!) 126, temperature (!) 101.6 F (38.7 C), temperature source Axillary, resp. rate (!) 32, height 5' 2.21" (1.58 m), weight 65.9 kg, SpO2 90 %. CVP:  [8 mmHg-17 mmHg] 16 mmHg      Intake/Output Summary (Last 24 hours) at 09/02/2019 1008 Last data filed at 09/02/2019 0900 Gross per 24 hour  Intake 2121.68 ml  Output 1765 ml  Net 356.68 ml   Filed Weights   08/31/19 0500 09/01/19 0500 09/02/19 0500  Weight: 61 kg 60.1 kg 65.9 kg   Examination: General: ill-appearing man laying in bed in NAD, appears older than stated age, very frail. Neck: right sided scar without erythema HEENT: /AT, eyes anicteric but thick secretions on right Neuro: fatigued, sleeping, arousable to stimulation but not awake and talking, moving arms spontaneously CV:  [id number], mechanical clicks, tachycardic, regular rhythm PULM: tachypneic, weak wet-sounding cough with thick secretions when NT suctioned GI: soft, NT, ND Extremities: dependent edema, no cyanosis Skin: stasis dermatatitis changes b/l shins, no rashes  Bedside US: very small simple appearing dependent bilateral pleural effusions <1cm  Resolved Hospital Problem list   Coagulopathy with elevated INR Hyperkalemia Digoxin Toxicity  Assessment & Plan:   Acute hypoxemic respiratory failure,  improved with diuresis. CXR with likely b/l dependent effusions, likely RLL pneumonia.  -Extubated 12/23 Plan: -con't aggressive pulmonary hygiene- NT suctioning, IS if able -nebs PRN -con't antibiotics; respiratory culture obtained this morning -con't diuresis -unable to perform thoracentesis given small volume of pleural fluid -con't cefepime for presumed RLL HAP; con't to follow blood cultures -con't supplemental O2 and titrate down as tolerated -I am very concerned   In Dr. Cordelia Pen previous discussions with patient, he does not want reintubation/ mechanical ventilator, even if that means he would not survive this hospitalization.  We have reached out to his wife to ask her to come in, updated he acutely decompensated overnight and now on continuous Bipap. We are supporting him by starting abx for PNA and giving lasix.   Septic shock 2/2 pneumonia -levophed as required to maintain MAP >65 -Respiratory culture today -Continue to follow blood cultures -Continue cefepime -Unfortunately unable to perform thoracentesis given small volume of fluid  Right neck hematoma secondary to a right IJ hemodialysis catheter placement -Status post intervention per vascular surgery 08/20/2019 Plan: -con't to monitor wound- appears stable  Acute encephalopathy, resolved Plan: -con't delirium precautions -mobility once appropriate   AKI on CKD 3- possible cardiorenal syndrome - S/p CRRT 12/18-12/24  - Etiology of his renal failure likely includes use of diuretics, poor p.o. intake, shock, chronic heart failure Plan -Appreciate nephrology's assistance -Continue to monitor I's/O -Daily renal labs -Avoid nephrotoxic meds and renally dose medications  Cardiogenic Shock Acute on Chronic systolic and diastolic CHF (EF 99991111). S/p resynchronization therapy with left pacing lead, but turned off 2/2 diaphragmatic stimulation. CAD Mechanical mitral valve Chronic atrial fibrillation H/o VT s/p  AICD/pacer  P: -Continue Coumadin dosing per pharmacy.  INR previously ordered for CBC insertion.  Heparin until INR is therapeutic -Continue amiodarone-transition to IV today -Goal potassium greater than 4 magnesium greater than 2 -Stopping midodrine today given Levophed requirements -May have to restart stress dose steroids -Monitor on telemetry   DM type 2 Plan -Accu-Cheks with sliding scale insulin as needed -Goal BG 100 [id number] admitted to the ICU  Anemia Plan -Transfuse for hemoglobin less than 7 -Iron transfusion received on 12/31  Deconditioning -chronic and critical illness -Appreciate PT and OT's assistance, but unfortunately today due to hemodynamic instability he is appropriate for mobility  Hypernatremia- improving --Continue D5 at 100 cc/h -Daily BMP   Poor prognosis.  I am concerned with his septic shock and respiratory failure that he may have deterioration of his respiratory status today.    Best practice:  Diet: tube feeds per recs: Currently on hold. Pain/Anxiety/Delirium protocol (if indicated): na VAP protocol (if indicated): na DVT prophylaxis: warfarin, pharmacy to dose. Heparin drip GI prophylaxis: PPI Glucose control: SSI Mobility: PT/OT Code Status: Full, pending GOC discussions with wife and patient.  Family Engagement: will update wife  Disposition: ICU  Labs   CBC: Recent Labs  Lab 08/29/19 0529 08/30/19 0345 08/31/19 0416 08/31/19 0447 09/01/19 0513 09/02/19 0500  WBC 19.4* 26.2*  --  22.0* 21.9* 23.8*  HGB 8.7* 9.1* 9.9* 8.8* 8.6* 8.2*  HCT 30.8* 32.3* 29.0* 31.7* 31.3* 31.2*  MCV 75.3* 75.8*  --  78.7* 80.7 84.1  PLT 229 274  --  244 218 [id number]    Basic Metabolic Panel: Recent  Labs  Lab 08/29/19 0529 08/30/19 0345 08/31/19 0447 08/31/19 1542 09/01/19 0513 09/01/19 1004 09/01/19 1700 09/02/19 0500  NA 148* 151* 151* 154* 158* 158* 159* 157*  K 4.5 3.7 3.9 3.2* 2.5* 3.2* 3.1* 4.9  CL 106 108 109 112* 109 111 112* 112*   CO2 31 31 31  32 38* 38* 38* 36*  GLUCOSE 122* 117* 113* 129* 111* 154* 110* 113*  BUN 94* 112* 118* 125* 120* 122* 125* 134*  CREATININE 2.12* 2.39* 2.55* 2.60* 2.49* 2.44* 2.58* 2.74*  CALCIUM 9.5 9.4 9.2 8.9 8.8* 8.5* 8.5* 8.2*  MG 2.7* 2.8* 2.9*  --  2.4  --  2.3 2.2  PHOS 4.6 4.2 5.5*  --  5.3*  --   --  3.1   GFR: Estimated Creatinine Clearance: 22.9 mL/min (A) (by C-G formula based on SCr of 2.74 mg/dL (H)). Recent Labs  Lab 08/30/19 0345 08/31/19 0447 09/01/19 0513 09/02/19 0500  PROCALCITON  --  0.40  --   --   WBC 26.2* 22.0* 21.9* 23.8*    Liver Function Tests: Recent Labs  Lab 08/26/19 2021 08/29/19 0529 08/30/19 0345 08/31/19 0447 09/01/19 0513 09/02/19 0500  AST 11*  --   --   --   --   --   ALT 18  --   --   --   --   --   ALKPHOS 66  --   --   --   --   --   BILITOT 0.2*  --   --   --   --   --   PROT 5.0*  --   --   --   --   --   ALBUMIN 2.0* 2.2* 2.2* 2.2* 2.1* 2.0*    ABG    Component Value Date/Time   PHART 7.435 08/31/2019 0416   PCO2ART 52.0 (H) 08/31/2019 0416   PO2ART 95.0 08/31/2019 0416   HCO3 35.1 (H) 08/31/2019 0416   TCO2 37 (H) 08/31/2019 0416   ACIDBASEDEF 1.0 01/26/2015 0425   O2SAT 49.8 09/02/2019 0500     Coagulation Profile: Recent Labs  Lab 08/30/19 0345 08/30/19 1422 08/31/19 0447 09/01/19 0513 09/02/19 0500  INR 7.5* 5.9* 2.2* 1.7* 2.3*    CBG: Recent Labs  Lab 09/01/19 0517 09/01/19 1120 09/01/19 1705 09/01/19 2328 09/02/19 0610  GLUCAP 99 131* 85 97 96     This patient is critically ill with multiple organ system failure which requires frequent high complexity decision making, assessment, support, evaluation, and titration of therapies. This was completed through the application of advanced monitoring technologies and extensive interpretation of multiple databases. During this encounter critical care time was devoted to patient care services described in this note for 50 minutes.  Julian Hy, DO  09/02/19 12:08 PM [facility]

## 2019-09-02 NOTE — Progress Notes (Signed)
[facility] Progress Note    Assessment/ Plan:   Pt is a 59 y.o. yo male CHF, mechanical mitral and aortic valve A. fib, s/p VT arrest, DM, potassium of 8.4 associated with hypotension and AKI on admission.  #Severe hyperkalemia with EKG changes due to use of spironolactone, KCl and AKI: Received urgent dialysis on 12/17, started CRRT on 12/18 for fluid overload- stopped on 12/24.  Resolved.   #Acute on CKD stage III: Baseline creatinine around 1.3-1.6, SCr worseing.  Likely has cardiorenal syndrome.  His prognosis is poor--> I do not think another trial of CRRT would improve his overall prognosis.  D/w PCCM.  # Hypernatremia, originally sig hyponatremia.  Worsened--> as above  #Acute on Chronic CHF, aortic and mitral valve replacement: S/p UF with CRRT.  AHF following and as above  #Hypotension/shock: back on pressors--> suspicion is for pneumonia at this time  #Acute respiratory failure/VDRF: worsened 12/30, on BiPAP. Per CCM  Anemia- needs replete iron-  Has allergy listed, Tolerated test dose, getting Fe.   Coagulopathy, improved  #Dispo: prognosis is poor and another trial of CRRT is unlikely to provide overall benefit.  Appreciate palliative care involvement.   Subjective:    Hypotensive and febrile overnight, concern for pneumonia.  Diuresis held when hypotensive, Lasix gtt started this AM per AHF.    HyperNa not really all that much improved, FWF increased to 100/ hr   Objective:   BP (!) 86/61   Pulse (!) 126   Temp (!) 101.6 F (38.7 C) (Axillary)   Resp (!) 32   Ht 5' 2.21" (1.58 m)   Wt 65.9 kg   SpO2 90%   BMI 26.40 kg/m   Intake/Output Summary (Last 24 hours) at 09/02/2019 1012 Last data filed at 09/02/2019 0900 Gross per 24 hour  Intake 2121.68 ml  Output 1765 ml  Net 356.68 ml   Weight change: 5.8 kg  Physical Exam: Gen: cachectic, severe temporal wasting CVS: tachycardic, no m/r/g Resp: tachypneic with rhonchi  bilaterally Abd: nontender Ext: 1+ RUE edema  Imaging: DG Chest Portable 1 View  Result Date: 08/31/2019 CLINICAL DATA:  Central line placement EXAM: PORTABLE CHEST 1 VIEW COMPARISON:  08/31/2019 at 7:33 a.m. FINDINGS: A nasogastric tube enters the stomach. Pacer device noted. Prosthetic aortic valve. A left internal jugular central line is visualized. This terminates in the lower SVC. No pneumothorax or complicating feature. Low lung volumes. Indistinct airspace opacity in the right mid lung and right lung base, as well as the left retrocardiac region. The right lateral ribs are inadvertently excluded. Thoracic spondylosis. IMPRESSION: 1. Left IJ line tip: SVC.  No pneumothorax. 2. Bibasilar and right mid lung airspace opacities potentially from atelectasis or pneumonia. 3. Otherwise stable. Electronically Signed   By: Van Clines M.D.   On: 08/31/2019 19:32    Labs: BMET Recent Labs  Lab 08/27/19 0459 08/28/19 0505 08/29/19 0529 08/30/19 0345 08/31/19 0416 08/31/19 0447 08/31/19 1542 09/01/19 0513 09/01/19 1004 09/01/19 1700 09/02/19 0500  NA 138 143 148* 151* 150* 151* 154* 158* 158* 159* 157*  K 4.5 3.8 4.5 3.7 4.0 3.9 3.2* 2.5* 3.2* 3.1* 4.9  CL 102 104 106 108  --  109 112* 109 111 112* 112*  CO2 29 31 31 31   --  31 32 38* 38* 38* 36*  GLUCOSE 149* 143* 122* 117*  --  113* 129* 111* 154* 110* 113*  BUN 67* 82* 94* 112*  --  118* 125* 120* 122* 125* 134*  CREATININE 1.83* 1.93* 2.12* 2.39*  --  2.55* 2.60* 2.49* 2.44* 2.58* 2.74*  CALCIUM 8.5* 9.1 9.5 9.4  --  9.2 8.9 8.8* 8.5* 8.5* 8.2*  PHOS 4.2 4.0 4.6 4.2  --  5.5*  --  5.3*  --   --  3.1   CBC Recent Labs  Lab 08/30/19 0345 08/31/19 0416 08/31/19 0447 09/01/19 0513 09/02/19 0500  WBC 26.2*  --  22.0* 21.9* 23.8*  HGB 9.1* 9.9* 8.8* 8.6* 8.2*  HCT 32.3* 29.0* 31.7* 31.3* 31.2*  MCV 75.8*  --  78.7* 80.7 84.1  PLT 274  --  244 218 215    Medications:    . sodium chloride   Intravenous Once  .  sodium chloride   Intravenous Once  . albuterol  2.5 mg Nebulization TID  . chlorhexidine  15 mL Mouth Rinse BID  . Chlorhexidine Gluconate Cloth  6 each Topical Q0600  . darbepoetin (ARANESP) injection - NON-DIALYSIS  100 mcg Subcutaneous Q Wed-1800  . feeding supplement (PRO-STAT SUGAR FREE 64)  30 mL Per Tube BID  . free water  100 mL Per Tube Q1H  . hydroxypropyl methylcellulose / hypromellose  1 drop Both Eyes TID  . insulin aspart  2-6 Units Subcutaneous Q6H  . mouth rinse  15 mL Mouth Rinse q12n4p  . pantoprazole sodium  40 mg Per Tube Daily  . warfarin  2.5 mg Oral ONCE-1800  . Warfarin - Pharmacist Dosing Inpatient   Does not apply [place], MD Vance pgr [phone] 09/02/2019, 10:12 AM

## 2019-09-02 NOTE — Progress Notes (Addendum)
Patient ID: Lance Cook, male   DOB: 05-15-1961, 59 y.o.   MRN: [id number]    Advanced Heart Failure Rounding Note   Subjective:    CVVHD stopped 12/24. Transitioned back to IV lasix after CVVHD discontinuation but developed worsening renal function and diuretics held which lead to progressive volume overload and hypoxic respiratory failure and placed on BIPAP. Pt refusing Intubation. Partial CODE. IV Lasix resumed.   Condition worsened overnight, fever to 102.9 and hypotensive, phenylephrine gtt started. BUN/creatinine worse today. Cefepime begun.   Co-ox 50%, CVP 18.   He is awake, rigoring, does not respond.   Sodium mildly lower at 157 with increased free water.     INR 2.2, on heparin gtt.  Objective:   Weight Range:  Vital Signs:   Temp:  [97.7 F (36.5 C)-102.9 F (39.4 C)] 101.6 F (38.7 C) (01/01 0700) Pulse Rate:  [109-133] 126 (01/01 0900) Resp:  [26-53] 32 (01/01 0900) BP: (72-139)/(38-94) 86/61 (01/01 0900) SpO2:  [90 %-100 %] 90 % (01/01 0900) Weight:  [65.9 kg] 65.9 kg (01/01 0500) Last BM Date: 09/01/19  Weight change: Filed Weights   08/31/19 0500 09/01/19 0500 09/02/19 0500  Weight: 61 kg 60.1 kg 65.9 kg    Intake/Output:   Intake/Output Summary (Last 24 hours) at 09/02/2019 1008 Last data filed at 09/02/2019 0900 Gross per 24 hour  Intake 2121.68 ml  Output 1765 ml  Net 356.68 ml     Physical Exam: CVP 18  General: Frail, rigors Neck: JVP 16 cm, no thyromegaly or thyroid nodule.  Lungs: Decreased at bases.  CV: Nondisplaced PMI.  Heart tachy, irregular pulse with mechanical S1/S2, no S3/S4, 2/6 SEM RUSB.  1+ edema to knees.   Abdomen: Soft, nontender, no hepatosplenomegaly, no distention.  Skin: Intact without lesions or rashes.  Neurologic: Awake but does not respond.   Extremities: No clubbing or cyanosis.  HEENT: Normal.   Telemetry: A fib 110s Personally reviewed   Labs: Basic Metabolic Panel: Recent Labs  Lab 08/29/19 0529  08/30/19 0345 08/31/19 0447 08/31/19 1542 09/01/19 0513 09/01/19 1004 09/01/19 1700 09/02/19 0500  NA 148* 151* 151* 154* 158* 158* 159* 157*  K 4.5 3.7 3.9 3.2* 2.5* 3.2* 3.1* 4.9  CL 106 108 109 112* 109 111 112* 112*  CO2 31 31 31  32 38* 38* 38* 36*  GLUCOSE 122* 117* 113* 129* 111* 154* 110* 113*  BUN 94* 112* 118* 125* 120* 122* 125* 134*  CREATININE 2.12* 2.39* 2.55* 2.60* 2.49* 2.44* 2.58* 2.74*  CALCIUM 9.5 9.4 9.2 8.9 8.8* 8.5* 8.5* 8.2*  MG 2.7* 2.8* 2.9*  --  2.4  --  2.3 2.2  PHOS 4.6 4.2 5.5*  --  5.3*  --   --  3.1    Liver Function Tests: Recent Labs  Lab 08/26/19 2021 08/29/19 0529 08/30/19 0345 08/31/19 0447 09/01/19 0513 09/02/19 0500  AST 11*  --   --   --   --   --   ALT 18  --   --   --   --   --   ALKPHOS 66  --   --   --   --   --   BILITOT 0.2*  --   --   --   --   --   PROT 5.0*  --   --   --   --   --   ALBUMIN 2.0* 2.2* 2.2* 2.2* 2.1* 2.0*   No results for input(s): LIPASE, AMYLASE in  the last 168 hours. Recent Labs  Lab 08/26/19 2021  AMMONIA 28    CBC: Recent Labs  Lab 08/29/19 0529 08/30/19 0345 08/31/19 0416 08/31/19 0447 09/01/19 0513 09/02/19 0500  WBC 19.4* 26.2*  --  22.0* 21.9* 23.8*  HGB 8.7* 9.1* 9.9* 8.8* 8.6* 8.2*  HCT 30.8* 32.3* 29.0* 31.7* 31.3* 31.2*  MCV 75.3* 75.8*  --  78.7* 80.7 84.1  PLT 229 274  --  244 218 215    Cardiac Enzymes: No results for input(s): CKTOTAL, CKMB, CKMBINDEX, TROPONINI in the last 168 hours.  BNP: BNP (last 3 results) Recent Labs    07/19/19 0956 08/02/19 0957 08/22/2019 1424  BNP 298.7* 167.3* 106.1*    ProBNP (last 3 results) No results for input(s): PROBNP in the last 8760 hours.    Other results:  Imaging: DG Chest Portable 1 View  Result Date: 08/31/2019 CLINICAL DATA:  Central line placement EXAM: PORTABLE CHEST 1 VIEW COMPARISON:  08/31/2019 at 7:33 a.m. FINDINGS: A nasogastric tube enters the stomach. Pacer device noted. Prosthetic aortic valve. A left  internal jugular central line is visualized. This terminates in the lower SVC. No pneumothorax or complicating feature. Low lung volumes. Indistinct airspace opacity in the right mid lung and right lung base, as well as the left retrocardiac region. The right lateral ribs are inadvertently excluded. Thoracic spondylosis. IMPRESSION: 1. Left IJ line tip: SVC.  No pneumothorax. 2. Bibasilar and right mid lung airspace opacities potentially from atelectasis or pneumonia. 3. Otherwise stable. Electronically Signed   By: Van Clines M.D.   On: 08/31/2019 19:32     Medications:     Scheduled Medications: . sodium chloride   Intravenous Once  . sodium chloride   Intravenous Once  . albuterol  2.5 mg Nebulization TID  . chlorhexidine  15 mL Mouth Rinse BID  . Chlorhexidine Gluconate Cloth  6 each Topical Q0600  . darbepoetin (ARANESP) injection - NON-DIALYSIS  100 mcg Subcutaneous Q Wed-1800  . feeding supplement (PRO-STAT SUGAR FREE 64)  30 mL Per Tube BID  . free water  100 mL Per Tube Q1H  . hydroxypropyl methylcellulose / hypromellose  1 drop Both Eyes TID  . insulin aspart  2-6 Units Subcutaneous Q6H  . mouth rinse  15 mL Mouth Rinse q12n4p  . pantoprazole sodium  40 mg Per Tube Daily  . warfarin  2.5 mg Oral ONCE-1800  . Warfarin - Pharmacist Dosing Inpatient   Does not apply q1800    Infusions: . sodium chloride Stopped (09/01/19 0922)  . amiodarone    . amiodarone    . ceFEPime (MAXIPIME) IV Stopped (09/01/19 2338)  . feeding supplement (JEVITY 1.5 CAL/FIBER) 1,000 mL (09/02/19 0126)  . furosemide (LASIX) infusion    . heparin 1,700 Units/hr (09/02/19 0900)  . norepinephrine (LEVOPHED) Adult infusion      PRN Medications: sodium chloride, acetaminophen, albuterol, bisacodyl, heparin, lip balm, ondansetron (ZOFRAN) IV, polyethylene glycol, sennosides   Assessment/Plan:   1. AKI on CKD stage 3: Baseline creatinine 1.4-1.6 Creatinine up to 2.7 at admission with marked  hyperkalemia and hyponatremia in setting of use of torsemide, metolazone, and spironolactone at home.  He had urgent HD with improvement.  CVVHD stopped 12/24. Creatinine has been steadily worsening since then.  Today, BUN 134 with creatinine 2.74.  - Would try to avoid CVVH given very poor prognosis at this point (and do not think he would tolerate iHD).  2. Acute on chronic systolic CHF/cardiogenic shock: EF 30-35% on  last echo.  [employer] CRT-D, LV not on due to diaphragmatic stimulation.  Now hypotensive/febrile, suspect septic shock physiology. Co-ox low at 50% today on phenylephrine 125. CVP 18, volume status worsening.  - Would wean off phenylephrine and onto norepinephrine.  Can add vasopressin if needed.  - Amiodarone gtt for rate control.  - Will start Lasix gtt at 10 mg/hr, hopefully can get some diuresis if we stabilize his BP.  Would rather not start back on CVVH given uncertain end point.   - Hold midodrine while on NE.  3. VT: Patient has [employer] CRT-D device, LV lead off due to diaphragmatic pacing.  Patient has has runs of VT, treated w/ amiodarone gtt. VT quiescent.  - As above, transitioning to amiodarone gtt for rate control.  4. Digoxin toxicity: Severely elevated digoxin level initially, had digibind and HD.  - bradycardia improved 5. CAD: Last cath in 2016 with patent native coronaries, probably occluded SVG-RCA.  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 INR 2.2 today.  - Heparin gtt while INR < 2.5.  7. Atrial fibrillation: Chronic.  Higher rate in 110s this morning.  - Transition to amiodarone gtt for rate control.  8. Acute hypoxic respiratory failure: CHF/pulmonary edema, suspect HAP with fever.  - Attempt diuresis.  - Abx for PNA.  - He is DNI.  9. Hypernatremia: Na 157, starting to trend down with free water.  10. ID: Fever to 102.9, ?HAP, suspect now with septic shock.  - Pressor management as above.  - Cefepime started.  - Cultures sent.    Prognosis appears increasingly poor.   CRITICAL CARE Performed by: Loralie Champagne  Total critical care time: 40 minutes  Critical care time was exclusive of separately billable procedures and treating other patients.  Critical care was necessary to treat or prevent imminent or life-threatening deterioration.  Critical care was time spent personally by me on the following activities: development of treatment plan with patient and/or surrogate as well as nursing, discussions with consultants, evaluation of patient's response to treatment, examination of patient, obtaining history from patient or surrogate, ordering and performing treatments and interventions, ordering and review of laboratory studies, ordering and review of radiographic studies, pulse oximetry and re-evaluation of patient's condition.   Length of Stay: [place]  09/02/2019, 10:08 AM  Advanced Heart Failure Team Pager [phone] (M-F; 7a - 4p)  Please contact [facility] for night-coverage after hours (4p -7a ) and weekends on [email or web address]

## 2019-09-02 NOTE — Progress Notes (Signed)
Attempted to call wife to update on patient's night. Phone went to voicemail and the voicemail box is full. Unable to leave message.

## 2019-09-02 NOTE — Progress Notes (Signed)
[facility] Progress Note Patient Name: Eliasar Pomykala DOB: 1960-09-06 MRN: [id number]   Date of Service  09/02/2019  HPI/Events of Note  Hypotension - Likely d/t fever to 102.9 F (and resulting vasodilation) and ongoing diuresis. BP = 87/60 and HR = 126. CVP = 11 and Hgb = 8.6.   eICU Interventions  Will order: 1. Hold diuresis for now.  2. Phenylephrine IV infusion. Titrate to MAP >= 65.  3. Ice packs PRN.     Intervention Category Major Interventions: Hypotension - evaluation and management  Kahliya Fraleigh [name] 09/02/2019, 4:52 AM

## 2019-09-02 NOTE — Progress Notes (Signed)
PMT follow-up  PMT provider reviewed chart in detail and discussed with Dr. Carlis Abbott and RN. Patient with worsening clinical condition today. Patient previously spoke of wishes against re-intubation if necessary. DNI, otherwise continue current plan of care and medical management. When PMT provider at bedside, patient's wife had just finished discussing plan with Dr. Carlis Abbott. Following this conversation, chaplain at bedside. PMT provider will continue to follow daily and be of support to patient/wife.    NO CHARGE  Ihor Dow, [place], FNP-C Palliative Medicine Team  Phone: [phone] Fax: [phone]

## 2019-09-02 NOTE — Progress Notes (Addendum)
ANTICOAGULATION CONSULT NOTE - Follow Up Consult  Pharmacy Consult for Heparin, resume warfarin Indication: Hx St Jude MVR/AVR + Afib  Patient Measurements: Height: 5' 2.21" (158 cm) Weight: 145 lb 4.5 oz (65.9 kg) IBW/kg (Calculated) : 55.07 Heparin Dosing Weight: 70 kg  Vital Signs: Temp: 101.6 F (38.7 C) (01/01 0700) Temp Source: Axillary (01/01 0700) BP: 72/46 (01/01 0645) Pulse Rate: 123 (01/01 0645)  Labs: Recent Labs    08/31/19 0447 09/01/19 0513 09/01/19 1004 09/01/19 1305 09/01/19 1700 09/01/19 2155 09/02/19 0500 09/02/19 0725  HGB 8.8* 8.6*  --   --   --   --  8.2*  --   HCT 31.7* 31.3*  --   --   --   --  31.2*  --   PLT 244 218  --   --   --   --  215  --   LABPROT 24.6* 20.3*  --   --   --   --  24.8*  --   INR 2.2* 1.7*  --   --   --   --  2.3*  --   HEPARINUNFRC  --  <0.10*  --  <0.10*  --  0.21*  --  0.42  CREATININE 2.55* 2.49* 2.44*  --  2.58*  --  2.74*  --     Estimated Creatinine Clearance: 22.9 mL/min (A) (by C-G formula based on SCr of 2.74 mg/dL (H)).  Assessment: 59 yr old male on warfarin PTA for hx St Jude MVR + Afib - admitted with AKI and hyperkalemia requiring line replacement and start of CRRT. PTA warfarin dose: 5mg  daily  Warfarin has been held since Sunday d/t rising INR - peaked at 7.5, now 1.7  Pharmacy asked to resume warfarin this evening.  Has NG tube for enteral dosing.  PTA warfarin dosing 5 mg daily except 2.5 mg Tu, Th, Sun.  Heparin level this evening slightly below goal.  No overt bleeding or complications noted.  Heparin level therapeutic this AM 0.42, INR 2.3  Goal of Therapy:  INR 2.5-3.5 HL 0.3 - 0.7 Monitor platelets by anticoagulation protocol: Yes   Plan:  Coumadin 1 mg po x 1 tonight.  Given exaggerated response earlier this admission, and increasing use of amiodarone will dose with caution initially. Daily INR. Continue heparin until INR > 2.5. Continue IV heparin to 1700 units/hr. Daily heparin  level and CBC.  Nicoletta Dress, PharmD PGY2 Infectious Disease Pharmacy Resident   09/02/2019 8:08 AM

## 2019-09-02 NOTE — Progress Notes (Signed)
Chaplain responded to page.  Patient's wife bedside. The couple have no children.  Patient has not communicated with her in a few days, she says. Patient's wife Lance Cook continues to pray for patient's recovery and says that she understands that the doctors are trying to be realistic but she wishes to remain hopeful for his recovery.  She does not wish for the staff to "speak negatively about his condition bedside" but would be ok if they talked to her outside the room.  "He can hear them." she says.   Chaplain offered words of scripture and prayer. Rev. Tamsen Snider Pager [phone]

## 2019-09-02 NOTE — Progress Notes (Signed)
Multiple concerns relayed to Dr. Oletta Darter. Hypotensive and febrile to 102.9. Patient also with increasing HR throughout the night. New orders obtained. See MAR.

## 2019-09-02 DEATH — deceased

## 2019-09-03 DIAGNOSIS — I5041 Acute combined systolic (congestive) and diastolic (congestive) heart failure: Secondary | ICD-10-CM

## 2019-09-03 DIAGNOSIS — Z515 Encounter for palliative care: Secondary | ICD-10-CM

## 2019-09-03 DIAGNOSIS — Z7189 Other specified counseling: Secondary | ICD-10-CM

## 2019-09-03 DIAGNOSIS — R570 Cardiogenic shock: Secondary | ICD-10-CM

## 2019-09-03 DIAGNOSIS — J189 Pneumonia, unspecified organism: Secondary | ICD-10-CM

## 2019-09-03 DIAGNOSIS — J9600 Acute respiratory failure, unspecified whether with hypoxia or hypercapnia: Secondary | ICD-10-CM

## 2019-09-03 DIAGNOSIS — N179 Acute kidney failure, unspecified: Secondary | ICD-10-CM

## 2019-09-03 LAB — BASIC METABOLIC PANEL
Anion gap: 12 (ref 5–15)
BUN: 159 mg/dL — ABNORMAL HIGH (ref 6–20)
CO2: 29 mmol/L (ref 22–32)
Calcium: 7.5 mg/dL — ABNORMAL LOW (ref 8.9–10.3)
Chloride: 104 mmol/L (ref 98–111)
Creatinine, Ser: 3.31 mg/dL — ABNORMAL HIGH (ref 0.61–1.24)
GFR calc Af Amer: 23 mL/min — ABNORMAL LOW (ref >60)
GFR calc non Af Amer: 19 mL/min — ABNORMAL LOW (ref >60)
Glucose, Bld: 146 mg/dL — ABNORMAL HIGH (ref 70–99)
Potassium: 4.8 mmol/L (ref 3.5–5.1)
Sodium: 145 mmol/L (ref 135–145)

## 2019-09-03 LAB — CBC
HCT: 33.5 % — ABNORMAL LOW (ref 39.0–52.0)
Hemoglobin: 8.7 g/dL — ABNORMAL LOW (ref 13.0–17.0)
MCH: 22 pg — ABNORMAL LOW (ref 26.0–34.0)
MCHC: 26 g/dL — ABNORMAL LOW (ref 30.0–36.0)
MCV: 84.8 fL (ref 80.0–100.0)
Platelets: 249 10*3/uL (ref 150–400)
RBC: 3.95 MIL/uL — ABNORMAL LOW (ref 4.22–5.81)
RDW: 28.9 % — ABNORMAL HIGH (ref 11.5–15.5)
WBC: 31.5 10*3/uL — ABNORMAL HIGH (ref 4.0–10.5)
nRBC: 1.5 % — ABNORMAL HIGH (ref 0.0–0.2)

## 2019-09-03 LAB — RENAL FUNCTION PANEL
Albumin: 1.9 g/dL — ABNORMAL LOW (ref 3.5–5.0)
Anion gap: 13 (ref 5–15)
BUN: 149 mg/dL — ABNORMAL HIGH (ref 6–20)
CO2: 31 mmol/L (ref 22–32)
Calcium: 7.7 mg/dL — ABNORMAL LOW (ref 8.9–10.3)
Chloride: 105 mmol/L (ref 98–111)
Creatinine, Ser: 3.07 mg/dL — ABNORMAL HIGH (ref 0.61–1.24)
GFR calc Af Amer: 25 mL/min — ABNORMAL LOW (ref >60)
GFR calc non Af Amer: 21 mL/min — ABNORMAL LOW (ref >60)
Glucose, Bld: 217 mg/dL — ABNORMAL HIGH (ref 70–99)
Phosphorus: 5.7 mg/dL — ABNORMAL HIGH (ref 2.5–4.6)
Potassium: 4.9 mmol/L (ref 3.5–5.1)
Sodium: 149 mmol/L — ABNORMAL HIGH (ref 135–145)

## 2019-09-03 LAB — GLUCOSE, CAPILLARY
Glucose-Capillary: 190 mg/dL — ABNORMAL HIGH (ref 70–99)
Glucose-Capillary: 130 mg/dL — ABNORMAL HIGH (ref 70–99)
Glucose-Capillary: 146 mg/dL — ABNORMAL HIGH (ref 70–99)
Glucose-Capillary: 157 mg/dL — ABNORMAL HIGH (ref 70–99)

## 2019-09-03 LAB — COOXEMETRY PANEL
Carboxyhemoglobin: 2.5 % — ABNORMAL HIGH (ref 0.5–1.5)
Carboxyhemoglobin: 2.1 % — ABNORMAL HIGH (ref 0.5–1.5)
Methemoglobin: 1.4 % (ref 0.0–1.5)
Methemoglobin: 1.4 % (ref 0.0–1.5)
O2 Saturation: 89.9 %
O2 Saturation: 62.7 %
Total hemoglobin: 9 g/dL — ABNORMAL LOW (ref 12.0–16.0)
Total hemoglobin: 8.7 g/dL — ABNORMAL LOW (ref 12.0–16.0)

## 2019-09-03 LAB — HEPARIN LEVEL (UNFRACTIONATED): Heparin Unfractionated: 0.72 IU/mL — ABNORMAL HIGH (ref 0.30–0.70)

## 2019-09-03 LAB — PROTIME-INR
INR: 2.6 — ABNORMAL HIGH (ref 0.8–1.2)
Prothrombin Time: 27.4 seconds — ABNORMAL HIGH (ref 11.4–15.2)

## 2019-09-03 LAB — MAGNESIUM: Magnesium: 2.2 mg/dL (ref 1.7–2.4)

## 2019-09-03 MED ORDER — GUAIFENESIN 100 MG/5ML PO SOLN
10.0000 mL | ORAL | Status: DC | PRN
  Administered 2019-09-03: 200 mg
  Filled 2019-09-03: qty 10

## 2019-09-03 MED ORDER — VANCOMYCIN HCL IN DEXTROSE 1-5 GM/200ML-% IV SOLN
1000.0000 mg | INTRAVENOUS | Status: DC
  Administered 2019-09-03: 1000 mg via INTRAVENOUS
  Filled 2019-09-03 (×2): qty 200

## 2019-09-03 MED ORDER — WARFARIN SODIUM 2.5 MG PO TABS
2.5000 mg | ORAL_TABLET | Freq: Once | ORAL | Status: AC
  Administered 2019-09-03: 2.5 mg via ORAL
  Filled 2019-09-03: qty 1

## 2019-09-03 MED ORDER — LORAZEPAM 2 MG/ML IJ SOLN: 0.5000 mg | Freq: Once | INTRAMUSCULAR | Status: DC

## 2019-09-03 MED ORDER — MORPHINE SULFATE (PF) 2 MG/ML IV SOLN: 0.5000 mg | INTRAVENOUS | Status: DC | PRN

## 2019-09-03 MED ORDER — DEXTROSE 5 % IV SOLN
250.0000 mg | Freq: Two times a day (BID) | INTRAVENOUS | Status: DC
  Administered 2019-09-03 – 2019-09-04 (×3): 250 mg via INTRAVENOUS
  Filled 2019-09-03 (×6): qty 250

## 2019-09-03 NOTE — Progress Notes (Signed)
[facility] Progress Note    Assessment/ Plan:   Pt is a 59 y.o. yo male CHF, mechanical mitral and aortic valve A. fib, s/p VT arrest, DM, potassium of 8.4 associated with hypotension and AKI on admission.  #Severe hyperkalemia with EKG changes due to use of spironolactone, KCl and AKI: Received urgent dialysis on 12/17, started CRRT on 12/18 for fluid overload- stopped on 12/24.   #Acute on CKD stage III: Baseline creatinine around 1.3-1.6, SCr worseing.  Likely has cardiorenal syndrome.  His prognosis is poor--> I do not think another trial of CRRT would improve his overall prognosis.  D/w PCCM today.  His BUN/Cr is worsening with diuretics and CRRT would not improve his overall outcome; he is too hemodynamically unstable to start IHD.    # Hypernatremia, originally sig hyponatremia.  Better with increased FWF.  #Acute on Chronic CHF, aortic and mitral valve replacement: S/p UF with CRRT.  AHF following and as above  #Hypotension/shock: back on pressors--> suspicion is for pneumonia at this time  #Acute respiratory failure/VDRF: resp status worsened with new HAP  Anemia- needs replete iron-  Has allergy listed, Tolerated test dose, getting Fe.   Coagulopathy, improved  #Dispo: prognosis is poor. Dialytic intervention is unlikely to change the overall trajectory.  I appreciate all consultants and palliative care involvement.     Subjective:    Sitting in chair, having rigors, hemodynamics are poor.     Objective:   BP 113/64   Pulse (!) 115   Temp (!) 101 F (38.3 C) (Axillary)   Resp (!) 31   Ht 5' 2.21" (1.58 m)   Wt 67.8 kg   SpO2 100%   BMI 27.16 kg/m   Intake/Output Summary (Last 24 hours) at 09/03/2019 1025 Last data filed at 09/03/2019 1000 Gross per 24 hour  Intake 2554.99 ml  Output 665 ml  Net 1889.99 ml   Weight change: 1.9 kg  Physical Exam: Gen: cachectic, severe temporal wasting CVS: tachycardic, no m/r/g Resp: tachypneic with  rhonchi bilaterally Abd: nontender Ext: 1+ RUE edema, anasarca of lower extremities  Imaging: DG CHEST PORT 1 VIEW  Result Date: 09/02/2019 CLINICAL DATA:  Reason for exam: PNA Hx of ascending aortic dissection, cad, chf, diabetes. Hx of mitral valve replacement, CABG, BENTALL procedure. EXAM: PORTABLE CHEST 1 VIEW COMPARISON:  Chest radiograph 08/31/2019 FINDINGS: Stable cardiomediastinal contours status post median sternotomy and valve replacement. Unchanged support apparatus. Low lung volumes with increasing consolidation at the right lung base. Persistent left retrocardiac opacity. Probable bilateral pleural effusions. No pneumothorax. IMPRESSION: 1. Low lung volumes with increasing right lower lobe consolidation. 2. Persistent left retrocardiac opacity. 3. Probable small bilateral pleural effusions. Electronically Signed   By: Audie Pinto M.D.   On: 09/02/2019 11:37    Labs: BMET Recent Labs  Lab 08/28/19 0505 08/29/19 0529 08/30/19 0345 08/31/19 0447 08/31/19 1542 09/01/19 0513 09/01/19 1004 09/01/19 1700 09/02/19 0500 09/03/19 0453  NA 143 148* 151* 151* 154* 158* 158* 159* 157* 149*  K 3.8 4.5 3.7 3.9 3.2* 2.5* 3.2* 3.1* 4.9 4.9  CL 104 106 108 109 112* 109 111 112* 112* 105  CO2 31 31 31 31  32 38* 38* 38* 36* 31  GLUCOSE 143* 122* 117* 113* 129* 111* 154* 110* 113* 217*  BUN 82* 94* 112* 118* 125* 120* 122* 125* 134* 149*  CREATININE 1.93* 2.12* 2.39* 2.55* 2.60* 2.49* 2.44* 2.58* 2.74* 3.07*  CALCIUM 9.1 9.5 9.4 9.2 8.9 8.8* 8.5* 8.5* 8.2* 7.7*  PHOS 4.0 4.6 4.2 5.5*  --  5.3*  --   --  3.1 5.7*   CBC Recent Labs  Lab 08/31/19 0447 09/01/19 0513 09/02/19 0500 09/03/19 0453  WBC 22.0* 21.9* 23.8* 31.5*  HGB 8.8* 8.6* 8.2* 8.7*  HCT 31.7* 31.3* 31.2* 33.5*  MCV 78.7* 80.7 84.1 84.8  PLT 244 218 215 249    Medications:    . sodium chloride   Intravenous Once  . sodium chloride   Intravenous Once  . albuterol  2.5 mg Nebulization TID  . chlorhexidine   15 mL Mouth Rinse BID  . Chlorhexidine Gluconate Cloth  6 each Topical Q0600  . darbepoetin (ARANESP) injection - NON-DIALYSIS  100 mcg Subcutaneous Q Wed-1800  . feeding supplement (PRO-STAT SUGAR FREE 64)  30 mL Per Tube BID  . free water  100 mL Per Tube Q1H  . hydrocortisone sod succinate (SOLU-CORTEF) inj  50 mg Intravenous Q6H  . hydroxypropyl methylcellulose / hypromellose  1 drop Both Eyes TID  . insulin aspart  2-6 Units Subcutaneous Q6H  . mouth rinse  15 mL Mouth Rinse q12n4p  . pantoprazole sodium  40 mg Per Tube Daily  . warfarin  2.5 mg Oral ONCE-1800  . Warfarin - Pharmacist Dosing Inpatient   Does not apply [place], MD [facility] pgr [phone] 09/03/2019, 10:25 AM

## 2019-09-03 NOTE — Progress Notes (Signed)
ANTICOAGULATION CONSULT NOTE - Follow Up Consult  Pharmacy Consult for Heparin, resume warfarin Indication: Hx St Jude MVR/AVR + Afib  Patient Measurements: Height: 5' 2.21" (158 cm) Weight: 149 lb 7.6 oz (67.8 kg) IBW/kg (Calculated) : 55.07 Heparin Dosing Weight: 70 kg  Vital Signs: Temp: 101 F (38.3 C) (01/02 0700) Temp Source: Axillary (01/02 0700) BP: 118/63 (01/02 0700) Pulse Rate: 111 (01/02 0700)  Labs: Recent Labs    09/01/19 0513 09/01/19 1004 09/01/19 1700 09/01/19 2155 09/02/19 0500 09/02/19 0725 09/03/19 0453  HGB 8.6*  --   --   --  8.2*  --  8.7*  HCT 31.3*  --   --   --  31.2*  --  33.5*  PLT 218  --   --   --  215  --  249  LABPROT 20.3*  --   --   --  24.8*  --  27.4*  INR 1.7*  --   --   --  2.3*  --  2.6*  HEPARINUNFRC <0.10*   < >  --  0.21*  --  0.42 0.72*  CREATININE 2.49*  --  2.58*  --  2.74*  --  3.07*   < > = values in this interval not displayed.    Estimated Creatinine Clearance: 22.3 mL/min (A) (by C-G formula based on SCr of 3.07 mg/dL (H)).  Assessment: 59 yr old male on warfarin PTA for hx St Jude MVR + Afib - admitted with AKI and hyperkalemia requiring line replacement and start of CRRT. PTA warfarin dose: 5mg  daily  Warfarin has been held since Sunday d/t rising INR - peaked at 7.5, now 1.7  Pharmacy asked to resume warfarin this evening.  Has NG tube for enteral dosing.  PTA warfarin dosing 5 mg daily except 2.5 mg Tu, Th, Sun.  Heparin level slightly supratherapeutic this AM 0.72, INR 2.6. INR now therapeutic and we will stop heparin drip.  Goal of Therapy:  INR 2.5-3.5 HL 0.3 - 0.7 Monitor platelets by anticoagulation protocol: Yes   Plan:  Coumadin 2.5 mg po x 1 tonight.  Given exaggerated response earlier this admission, and increasing use of amiodarone will dose with caution initially. Daily INR. Stop heparin drip  Nicoletta Dress, PharmD PGY2 Infectious Disease Pharmacy Resident   09/03/2019 8:32 AM

## 2019-09-03 NOTE — Progress Notes (Signed)
Daily Progress Note   Patient Name: Lance Cook       Date: 09/03/2019 DOB: 16-Jun-1961  Age: 59 y.o. MRN#: [id number] Attending Physician: Julian Hy, DO Primary Care Physician: [name], MD Admit Date: 08/10/2019  Reason for Consultation/Follow-up: Establishing goals of care  Subjective: Patient lethargic with tachypnea and audible secretions requiring frequent NT suctioning. [name] 4L. Remains on levo gtt.  GOC:  Extensive time spent with wife, Lance Cook this afternoon. She requests we speak outside of the room. She tells me multiple times that she wishes for care team to discuss nothing but positivity in front of Lance Cook.   Discussed course of hospitalization including diagnoses and interventions. Lance Cook shares that she has had multiple conversations with providers but she chooses to remain positive and adamant with the fact that Lance Cook will show improvement and will have to discharge to SNF for rehab. She asks about list of medications when he is closer to discharge. She speaks of her very strong [place] faith that this is in [name] and Lance Cook is going to get better, even if it takes time and aggressive physical therapy.   Therapeutic listening and Lance Cook shares many frustrations with negativity given by care team. She wants to ensure that everything possible is being done for her husband and doctors aren't 'giving up' on him or 'writing him off.' Reiterated that the doctors are doing everything possible to give Lance Cook a fighting chance of survival. Compassionately shared that heart, lung, kidneys all work together and challenges with managing one condition (heart/fluid overload) with lasix that could be worsening kidney function. Explained cardiogenic and septic shock requiring ongoing vasopressor  support. Also worsening respiratory status with pneumonia. Explained that it is our job as Paediatric nurse to be realistic and honest with her about prognosis. She shares that providers are 'working medically' but 'God is working spiritually.' Spiritual support provided.   Lance Cook does open up about her fears with possibility she could loose her husband of 25 years. She shares that she has to 'live with' decisions that she makes, therefore why she desires FULL code if he cardiac arrested. I did explain to [facility] that Lance Cook was very clear with PCCM MD and PMT PA the other day that he would NOT want to be re-intubated.   Therapeutic listening. Emotional/spiritual support provided. Lance Cook  is agreeable with ongoing support from PMT providers. PMT contact information given.   Lance Cook requests I speak with a family friend, Lance Cook via telephone to update on plan of care including diagnoses, interventions, and prognosis.   Lance Cook also requests I speak with their pastor, Lance Cook via telephone to provide update. Spoke with Lance Cook for 40 minutes. Answered numerous questions regarding plan of care, diagnoses, interventions, prognosis, labs/test results. Lance Cook also shares the request to stay positive around Lance Cook and Lance Cook. Very spiritual individuals of Christian faith. Lance Cook shares that three weeks ago, patient was hospitalized and required shock. During this critical period, patient had a vision. He saw Jesus and was told it "was not his time." Lance Cook shares that patient's wife is legally blind and history of stroke. It is their belief that God is not calling Lance Cook home yet, he is meant to stay on this earth to care for Lance Cook. Lance Cook is very appreciative of update and support from care team.   Length of Stay: 16  Current Medications: Scheduled Meds:  . sodium chloride   Intravenous Once  . sodium chloride   Intravenous Once  . albuterol  2.5 mg Nebulization TID  . chlorhexidine  15 mL Mouth Rinse BID  .  Chlorhexidine Gluconate Cloth  6 each Topical Q0600  . darbepoetin (ARANESP) injection - NON-DIALYSIS  100 mcg Subcutaneous Q Wed-1800  . feeding supplement (PRO-STAT SUGAR FREE 64)  30 mL Per Tube BID  . free water  100 mL Per Tube Q1H  . hydrocortisone sod succinate (SOLU-CORTEF) inj  50 mg Intravenous Q6H  . hydroxypropyl methylcellulose / hypromellose  1 drop Both Eyes TID  . insulin aspart  2-6 Units Subcutaneous Q6H  . mouth rinse  15 mL Mouth Rinse q12n4p  . pantoprazole sodium  40 mg Per Tube Daily  . warfarin  2.5 mg Oral ONCE-1800  . Warfarin - Pharmacist Dosing Inpatient   Does not apply q1800    Continuous Infusions: . sodium chloride Stopped (09/01/19 0922)  . amiodarone 30 mg/hr (09/03/19 1400)  . ceFEPime (MAXIPIME) IV Stopped (09/03/19 1212)  . feeding supplement (JEVITY 1.5 CAL/FIBER) 1,000 mL (09/02/19 0126)  . furosemide (LASIX) infusion 10 mg/hr (09/03/19 1400)  . norepinephrine (LEVOPHED) Adult infusion 8 mcg/min (09/03/19 1400)    PRN Meds: sodium chloride, acetaminophen, albuterol, bisacodyl, guaiFENesin, heparin, lip balm, ondansetron (ZOFRAN) IV, polyethylene glycol, sennosides  Physical Exam Vitals and nursing note reviewed.  Constitutional:      Appearance: He is cachectic. He is ill-appearing.  HENT:     Head: Normocephalic and atraumatic.  Cardiovascular:     Rate and Rhythm: Rhythm irregularly irregular.  Pulmonary:     Effort: Tachypnea and accessory muscle usage present.     Breath sounds: Rhonchi present.     Comments: Audible secretions Abdominal:     Tenderness: There is no abdominal tenderness.  Skin:    General: Skin is warm and dry.  Neurological:     Mental Status: He is lethargic.             Vital Signs: BP 106/65   Pulse (!) 121   Temp (!) 101.2 F (38.4 C) (Axillary)   Resp (!) 39   Ht 5' 2.21" (1.58 m)   Wt 67.8 kg   SpO2 96%   BMI 27.16 kg/m  SpO2: SpO2: 96 % O2 Device: O2 Device: Nasal Cannula O2 Flow Rate: O2  Flow Rate (L/min): 4 L/min  Intake/output summary:   Intake/Output Summary (Last 24  hours) at 09/03/2019 1646 Last data filed at 09/03/2019 1500 Gross per 24 hour  Intake 2798.73 ml  Output 820 ml  Net 1978.73 ml   LBM: Last BM Date: 09/03/19 Baseline Weight: Weight: 80.3 kg Most recent weight: Weight: 67.8 kg       Palliative Assessment/Data: PPS 30%      Patient Active Problem List   Diagnosis Date Noted  . AKI (acute kidney injury) ([place])   . Respiration abnormal   . Protein-calorie malnutrition, severe 08/23/2019  . Cardiogenic shock ([place])   . Central venous catheter in place   . Hyperkalemia 08/11/2019  . Bradycardia   . Acute on chronic systolic CHF (congestive heart failure), NYHA class 4 ([place]) 06/21/2019  . Acute on chronic diastolic (congestive) heart failure ([place]) 04/01/2019  . Acute on chronic congestive heart failure ([place])   . Anasarca   . Pressure injury of skin 02/14/2019  . Acute on chronic heart failure ([place]) 02/13/2019  . Benign neoplasm of ascending colon   . Benign neoplasm of rectum   . Benign neoplasm of rectosigmoid junction   . Benign neoplasm of sigmoid colon   . DM type 2 with diabetic dyslipidemia ([place]) 04/26/2018  . Lower extremity edema 03/08/2018  . Other fatigue 01/28/2018  . Chronic anticoagulation 01/05/2018  . Insomnia 11/23/2017  . Unilateral vocal cord paralysis 11/30/2016  . Leg swelling 11/28/2016  . S/P MVR (mitral valve replacement)   . Acute on chronic diastolic CHF (congestive heart failure) ([place]) 08/27/2016  . Chronic pain syndrome 07/10/2016  . Chronic diarrhea   . Benign neoplasm of transverse colon   . Scrotal swelling 05/30/2016  . Musculoskeletal pain 01/07/2016  . Personal history of malignant neoplasm of testis 07/12/2015  . Long term current use of opiate analgesic 07/11/2015  . Long term prescription opiate use 07/11/2015  . Opiate use (22.5 MME/day) 07/11/2015  . Opiate dependence ([place]) 07/11/2015  . Encounter  for therapeutic drug level monitoring 07/11/2015  . Chronic low back pain (midline) 07/11/2015  . Lumbar facet syndrome 07/11/2015  . Chronic lower extremity pain (Left) 07/11/2015  . Adult BMI 30+ 06/19/2015  . Obesity (BMI 30-39.9) 06/19/2015  . Hypomagnesemia 02/08/2015  . Physical deconditioning 02/08/2015  . Acute encephalopathy 02/05/2015  . Enteritis due to Clostridium difficile 01/30/2015  . Acute respiratory failure with hypoxemia ([place])   . 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 [id number]  . Chronic diastolic congestive heart failure ([place])   . Type 2 diabetes mellitus with other specified complication ([place]) 99991111  . 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 Venous insufficiency of both lower extremities 04/02/2014  . Morbid obesity ([place]) 04/02/2014  . Diabetes mellitus ([place]) 03/14/2014  . Personal history of transient ischemic attack (TIA), and cerebral infarction without residual deficits 02/23/2014  . Malignant neoplasm of testis ([place]) 02/23/2014  . H/O TIA (transient ischemic attack) and stroke 02/23/2014  . HTN (hypertension) 02/23/2014  . Lymphadenopathy 02/23/2014  . S/P AVR (aortic valve replacement) 02/23/2014  . Encounter for therapeutic drug monitoring 02/02/2014  . Long term current use of anticoagulant therapy 07/15/2012  . Hypogonadism male 04/08/2012  . 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 Testicular hypofunction 04/08/2012  . Male hypogonadism 04/08/2012  . Other testicular hypofunction 04/08/2012  . History of colonic polyps 12/23/2011  . Testicular cancer ([place])   . TIA (transient ischemic attack)   . OSA (obstructive sleep apnea)   . Lumbar spinal stenosis (Severe L4-5)  10/08/2011  . ED (erectile dysfunction) of organic origin 10/08/2011  . Lumbar canal stenosis 10/08/2011  . Low back pain 10/08/2011  . Spinal stenosis of lumbar region without neurogenic claudication 10/08/2011  . CAD  (coronary artery disease)   . Spondylosis 04/29/2010  . DYSLIPIDEMIA 01/15/2010  . Chronic venous hypertension with ulcer ([place]) 01/15/2010  . Essential hypertension 12/14/2009  . Gout 11/01/2009  . Anemia, iron deficiency 11/01/2009  . MYOCARDIAL INFARCTION, HX OF 11/01/2009  . Atrial fibrillation, chronic 11/01/2009  . PERIPHERAL EDEMA 11/01/2009  . Heart valve replaced by other means 11/01/2009  . Presence of other heart-valve replacement 11/01/2009  . Ascending aortic dissection ([place]) 07/14/2008  . 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 Venous (peripheral) insufficiency 02/03/2005    Palliative Care Assessment & Plan   Patient Profile: 59 y.o. male  with past medical history of CABG, aortic and mitral valve replacements, combined systolic and diastolic heart failure (EF 30-35%), OSA/ARDS, CVA testicular cancer and tubulovillous adenoma who was admitted on 08/24/2019 with weakness and hypotension.  He was found to be severely bradycardic with hyperkalemia and acute on chronic renal failure.  He developed cardiogenic shock and required urgent intubation and continuous renal replacement therapy.  He has since been able to wean from the vent.  He is still considered volume overloaded.  He is no longer on CRRT.  Diuresis is being attempted with IV diuretic drip.  The patient remains critically weak and deconditioned.  He is having great difficulty handling his secretions.  Core track is in place for nutrition.  He is felt to have cardiorenal syndrome. Nephrology and heart failure team following. Patient is not a candidate for CRRT or iHD. Now with pneumonia receiving antibiotics. Palliative medicine consultation for goals of care.   Assessment: Acute hypoxemic respiratory failure A/c combined systolic and diastolic CHF EF [id number] Bilateral pleural effusions RLL pneumonia Septic shock Cardiogenic shock Acute encephalopathy AKI on CKD stage 3, likely cardiorenal  syndrome Hx VT s/p AICD/pacemaker placement Hx of chronic atrial fibrillation Hx of mitral and aortic valve replacements  Recommendations/Plan:  DNI, otherwise wife wishes to continue FULL code/FULL scope treatment. Wife is a very spiritual individual and believes this is God's hands. Multiple providers have expressed concern with critical condition and poor prognosis.   Ongoing palliative rapport and discussions. Patient remains critically ill and very high risk for further clinical deterioration.   Goals of Care and Additional Recommendations:  Limitations on Scope of Treatment: Full Scope Treatment  Code Status: Limited interventions--DO NOT INTUBATE   Code Status Orders  (From admission, onward)         Start     Ordered   08/31/19 1051  Limited resuscitation (code)  Continuous    Question Answer Comment  In the event of cardiac or respiratory ARREST: Initiate Code Blue, Call Rapid Response Yes   In the event of cardiac or respiratory ARREST: Perform CPR Yes   In the event of cardiac or respiratory ARREST: Perform Intubation/Mechanical Ventilation No   In the event of cardiac or respiratory ARREST: Use NIPPV/BiPAp only if indicated Yes   In the event of cardiac or respiratory ARREST: Administer ACLS medications if indicated Yes   In the event of cardiac or respiratory ARREST: Perform Defibrillation or Cardioversion if indicated Yes      08/31/19 1050        Code Status History    Date Active Date Inactive Code Status Order ID Comments User Context  08/04/2019 1534 08/31/2019 1050 Full Code [id number]  Collene Gobble, MD ED   06/21/2019 2002 07/01/2019 1716 Full Code [id number]  Barrett, Evelene Croon, PA-C ED   04/01/2019 2359 04/11/2019 1446 Full Code [id number]  Bryna Colander, MD ED   02/13/2019 1856 02/27/2019 1918 Full Code [id number]  Lars Mage, MD ED   08/27/2016 1331 09/03/2016 2049 Full Code [id number]  Erma Heritage, [place] Inpatient   02/08/2015 1953 02/15/2015  1551 Full Code [id number]  Flora Lipps Inpatient   01/24/2015 1833 02/08/2015 1953 Full Code [id number]  Rexene Alberts, MD Inpatient   12/18/2014 0858 12/18/2014 1415 Full Code [id number]  Burnell Blanks, MD Inpatient   04/02/2014 2301 04/05/2014 1552 Full Code [id number]  Theressa Millard, MD Inpatient   Advance Care Planning Activity       Prognosis:   Poor prognosis   Discharge Planning:  To Be Determined  Care plan was discussed with RN, wife, pastor, family friend, Dr. Duwayne Heck  Thank you for allowing the Palliative Medicine Team to assist in the care of this patient.   Time In: 1520 Time Out: 1720 Total Time 120 Prolonged Time Billed yes      Greater than 50%  of this time was spent counseling and coordinating care related to the above assessment and plan.  Ihor Dow, DNP, FNP-C Palliative Medicine Team  Phone: [phone] Fax: [phone]  Please contact Palliative Medicine Team phone at [phone] for questions and concerns.

## 2019-09-03 NOTE — Progress Notes (Signed)
NAME:  Lance Cook, MRN:  [id number], DOB:  [birth year], LOS: 55 ADMISSION DATE:  08/11/2019, CONSULTATION DATE:  08/08/2019 REFERRING MD:  Dr Sherry Ruffing, CHIEF COMPLAINT:  Hyperkalemia    Brief History   59 yo male with PMH significant for Bentall procedure, MVR, Afib, CAD, systolic and diastolic heart failure admitted with bradycardia and found to have severe life-threatening hyperkalemia with AKI and dig toxicity, PCCM consulted for admission and need for urgent iHD.  Hospital course included mechanical ventilation 12/18-12/23.    History of present illness   59 year old man, history of Bentall procedure, MVR, atrial fibrillation, CAD with chronic systolic and diastolic CHF.  Came to the ER with fatigue, weakness and hypotension.  He was found to be profoundly bradycardic with severe left bundle branch block and QRS widening on EKG.  Found to be severely hyperkalemic with new acute renal failure.  He is on digoxin at baseline and his digoxin level was 2.6.  He will be admitted emergently to the ICU for hemodialysis. He was given digibind for dig toxicity.   Significant Hospital Events   12/17 Admitted, urgent HD 12/18 had HD catheter in carotid removed in OR. Returned intubated 12/23 Extubated  Consults:  Nephrology Heart Failure  Procedures:  R IJ HD catheter 12/17 >> Misplaced in carotid, removed in OR 12/18 L IJ HD catheter 12/17 >>  ETI 12/18>>12/23  Significant Diagnostic Tests:  NM cardiac amyloid scan 10/21 >> equivocal for any evidence amyloidosis  ECHO 12/18 > LVEF 30 to 35%. No left ventricular hypertrophy. RV has normal systolic function. The mitral valve has been repaired/replaced. Mild mitral valve regurgitation. Moderate mitral stenosis. Tricuspid valve regurgitation mild-moderate. 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  S/P Bentall.  Moderately elevated pulmonary artery systolic pressure. . A pacer wire is visualized in the RV and RA. Right atrial pressure of  15 mmHg. History of mechanical aortic and mitral valves. Leaflets not well visualized, could consider fluroscopy of leaflets, but gradients not suggestive of obstruction.  CXR 12/17 > New left internal jugular central line tip in the SVC above the right atrium. No pneumothorax. No other change.  Micro Data:  COVID 12/17 > negative  Blood cultures 12/17 >NGTD   MRSA PCR 12/17 >negative  Urine culture 12/18 >  Negative  Antimicrobials:  Cefazolin 12/18 SCIP  Vanco 12/30 >> Cefepime 12/30 >>  Interim history/subjective:  Febrile, tachycardic, worsening hypotension overnight. Aspirated on liquids, so now NPO.  Objective   Blood pressure 113/64, pulse (!) 115, temperature (!) 101 F (38.3 C), temperature source Axillary, resp. rate (!) 31, height 5' 2.21" (1.58 m), weight 67.8 kg, SpO2 100 %. CVP:  [11 mmHg-16 mmHg] 11 mmHg      Intake/Output Summary (Last 24 hours) at 09/03/2019 1130 Last data filed at 09/03/2019 1100 Gross per 24 hour  Intake 2451 ml  Output 665 ml  Net 1786 ml   Filed Weights   09/01/19 0500 09/02/19 0500 09/03/19 0409  Weight: 60.1 kg 65.9 kg 67.8 kg   Examination: General: ill-appearing man laying in bed in NAD, appears older than stated age, very frail. Neck: right sided scar without erythema HEENT: [place]/AT, dusky appearing lips Neuro: fatigued, sleeping, arousable to stimulation but not awake and talking,  CV:  [id number], mechanical clicks, tachycardic, regular rhythm PULM: tachypneic, weak wet-sounding cough with thick secretions when NT suctioned GI: soft, NT, ND Extremities: dependent edema, no cyanosis Skin: stasis dermatatitis changes b/l shins, no rashes  Bedside US: very small simple appearing  dependent bilateral pleural effusions <1cm  Resolved Hospital Problem list   Coagulopathy with elevated INR Hyperkalemia Digoxin Toxicity  Assessment & Plan:   Acute hypoxemic respiratory failure, improved with diuresis. CXR with likely b/l dependent  effusions, likely RLL pneumonia.  -Extubated 12/23 Tachycardic since 12/31  Tachypneic since  12/28 ish Febrile, improved ON lower dose O2 this afternoon 4 L [place] +2.3 today, minimal UOP Plan: -con't aggressive pulmonary hygiene- NT suctioning, IS if able -nebs PRN -con't antibiotics; respiratory culture obtained 1/1 -con't diuresis -unable to perform thoracentesis given small volume of pleural fluid -con't cefepime for presumed RLL HAP; con't to follow blood cultures vanc per pharm consult -con't supplemental O2 and titrate down as tolerated Cont lasix, try diuril.   In Dr. Cordelia Pen previous discussions with patient, he does not want reintubation/ mechanical ventilator, even if that means he would not survive this hospitalization.  I discussed with Dr Loanne Drilling to clarify, patient was very clear.   Patient is too altered for bipap now  Septic shock 2/2 pneumonia -levophed as required to maintain MAP >65 -Respiratory culture 1/1  -Continue to follow blood cultures -Continue cefepime, vanc -Unfortunately unable to perform thoracentesis given small volume of fluid -on stress steroids  Right neck hematoma secondary to a right IJ hemodialysis catheter placement -Status post intervention per vascular surgery 08/20/2019 Plan: -con't to monitor wound- appears stable  Acute encephalopathy,  Infection related.  Plan: -con't delirium precautions -mobility once appropriate   AKI on CKD 3- possible cardiorenal syndrome - S/p CRRT 12/18-12/24  - Etiology of his renal failure likely includes use of diuretics, poor p.o. intake, shock, chronic heart failure Plan -Appreciate nephrology's assistance -Continue to monitor I's/O -Daily renal labs -Avoid nephrotoxic meds and renally dose medications -try to reduce volume as much as possible Poor prognosis, HD will not change course per renal.   Cardiogenic Shock Acute on Chronic systolic and diastolic CHF (EF 99991111). S/p resynchronization  therapy with left pacing lead, but turned off 2/2 diaphragmatic stimulation. CAD Mechanical mitral valve Chronic atrial fibrillation H/o VT s/p AICD/pacer  SCVO2 62% P: -Continue Coumadin dosing per pharmacy.  INR previously ordered for CBC insertion.  Heparin stopped cont Warfarin.  -Continue amiodarone IV -Goal potassium greater than 4 magnesium greater than 2 -Stoped midodrine today given Levophed requirements -Monitor on telemetry    DM type 2 Plan -Accu-Cheks with sliding scale insulin as needed -Goal BG 100 4181 admitted to the ICU  Anemia Plan -Transfuse for hemoglobin less than 7 -Iron transfusion received on 12/31  Deconditioning -chronic and critical illness -Appreciate PT and OT's assistance, but unfortunately today due to hemodynamic instability he is appropriate for mobility  Hypernatremia- improving --Continue fw flushes, decrease if PM BMP improving -Daily BMP  Discussed poor prognosis with wife.   Best practice:  Diet: tube feeds per recs: tolerating Pain/Anxiety/Delirium protocol (if indicated): na VAP protocol (if indicated): na DVT prophylaxis: warfarin, pharmacy to dose. Heparin drip d/ced  GI prophylaxis: PPI Glucose control: SSI Mobility: PT/OT Code Status: DNI, otherwise Ok for Chest compressions.  Family Engagement: discussed with wife at bedside. Disposition: ICU  Labs   CBC: Recent Labs  Lab 08/30/19 0345 08/31/19 0416 08/31/19 0447 09/01/19 0513 09/02/19 0500 09/03/19 0453  WBC 26.2*  --  22.0* 21.9* 23.8* 31.5*  HGB 9.1* 9.9* 8.8* 8.6* 8.2* 8.7*  HCT 32.3* 29.0* 31.7* 31.3* 31.2* 33.5*  MCV 75.8*  --  78.7* 80.7 84.1 84.8  PLT 274  --  244 218 215 249  Basic Metabolic Panel: Recent Labs  Lab 08/30/19 0345 08/31/19 0447 09/01/19 0513 09/01/19 1004 09/01/19 1700 09/02/19 0500 09/03/19 0453  NA 151* 151* 158* 158* 159* 157* 149*  K 3.7 3.9 2.5* 3.2* 3.1* 4.9 4.9  CL 108 109 109 111 112* 112* 105  CO2 31 31 38* 38*  38* 36* 31  GLUCOSE 117* 113* 111* 154* 110* 113* 217*  BUN 112* 118* 120* 122* 125* 134* 149*  CREATININE 2.39* 2.55* 2.49* 2.44* 2.58* 2.74* 3.07*  CALCIUM 9.4 9.2 8.8* 8.5* 8.5* 8.2* 7.7*  MG 2.8* 2.9* 2.4  --  2.3 2.2 2.2  PHOS 4.2 5.5* 5.3*  --   --  3.1 5.7*   GFR: Estimated Creatinine Clearance: 22.3 mL/min (A) (by C-G formula based on SCr of 3.07 mg/dL (H)). Recent Labs  Lab 08/31/19 0447 09/01/19 0513 09/02/19 0500 09/03/19 0453  PROCALCITON 0.40  --   --   --   WBC 22.0* 21.9* 23.8* 31.5*    Liver Function Tests: Recent Labs  Lab 08/30/19 0345 08/31/19 0447 09/01/19 0513 09/02/19 0500 09/03/19 0453  ALBUMIN 2.2* 2.2* 2.1* 2.0* 1.9*    ABG    Component Value Date/Time   PHART 7.435 08/31/2019 0416   PCO2ART 52.0 (H) 08/31/2019 0416   PO2ART 95.0 08/31/2019 0416   HCO3 35.1 (H) 08/31/2019 0416   TCO2 37 (H) 08/31/2019 0416   ACIDBASEDEF 1.0 01/26/2015 0425   O2SAT 62.7 09/03/2019 1027     Coagulation Profile: Recent Labs  Lab 08/30/19 1422 08/31/19 0447 09/01/19 0513 09/02/19 0500 09/03/19 0453  INR 5.9* 2.2* 1.7* 2.3* 2.6*    CBG: Recent Labs  Lab 09/02/19 0610 09/02/19 1126 09/02/19 1714 09/02/19 2356 09/03/19 0545  GLUCAP 96 140* 144* 203* [place], DO 09/03/19 11:30 AM [facility]

## 2019-09-03 NOTE — Progress Notes (Signed)
Patient ID: Lance Cook, male   DOB: February 13, 1961, 59 y.o.   MRN: [id number]    Advanced Heart Failure Rounding Note   Subjective:    CVVHD stopped 12/24. Transitioned back to IV lasix after CVVHD discontinuation but developed worsening renal function and diuretics held which lead to progressive volume overload and hypoxic respiratory failure.  Off Bipap this morning, on nasal cannula. Pt has refused intubation. Partial CODE. Back on Lasix gtt, poor UOP but CVP only 10-11 today.  Co-ox probably inaccurate (90%), would resend.  Now on norepinephrine 10 with stable MAP.   Febrile overnight, on cefepime.   He is awake and follows commands.   Sodium 149 with increased free water.     INR 2.6, now off heparin gtt.   Objective:   Weight Range:  Vital Signs:   Temp:  [97.6 F (36.4 C)-102.9 F (39.4 C)] 101 F (38.3 C) (01/02 0700) Pulse Rate:  [111-250] 115 (01/02 1000) Resp:  [24-51] 31 (01/02 1000) BP: (88-187)/(46-161) 113/64 (01/02 1000) SpO2:  [61 %-100 %] 100 % (01/02 1000) Weight:  [67.8 kg] 67.8 kg (01/02 0409) Last BM Date: 09/01/19  Weight change: Filed Weights   09/01/19 0500 09/02/19 0500 09/03/19 0409  Weight: 60.1 kg 65.9 kg 67.8 kg    Intake/Output:   Intake/Output Summary (Last 24 hours) at 09/03/2019 1016 Last data filed at 09/03/2019 0600 Gross per 24 hour  Intake 1789.34 ml  Output 465 ml  Net 1324.34 ml     Physical Exam: CVP 10-11 General: Frail, alert and comfortable. Mildly tachypneic.  Neck: JVP 12 cm, no thyromegaly or thyroid nodule.  Lungs: Decreased at bases.  CV: Nondisplaced PMI.  Heart tachy, irregular pulse with mechanical S1/S2, no S3/S4, 2/6 SEM RUSB.  1+ edema to knees.   Abdomen: Soft, nontender, no hepatosplenomegaly, no distention.  Skin: Intact without lesions or rashes.  Neurologic: Awake but does not respond.   Extremities: No clubbing or cyanosis.  HEENT: Normal.   Telemetry: A fib 110s Personally reviewed   Labs: Basic  Metabolic Panel: Recent Labs  Lab 08/30/19 0345 08/31/19 0447 09/01/19 0513 09/01/19 1004 09/01/19 1700 09/02/19 0500 09/03/19 0453  NA 151* 151* 158* 158* 159* 157* 149*  K 3.7 3.9 2.5* 3.2* 3.1* 4.9 4.9  CL 108 109 109 111 112* 112* 105  CO2 31 31 38* 38* 38* 36* 31  GLUCOSE 117* 113* 111* 154* 110* 113* 217*  BUN 112* 118* 120* 122* 125* 134* 149*  CREATININE 2.39* 2.55* 2.49* 2.44* 2.58* 2.74* 3.07*  CALCIUM 9.4 9.2 8.8* 8.5* 8.5* 8.2* 7.7*  MG 2.8* 2.9* 2.4  --  2.3 2.2 2.2  PHOS 4.2 5.5* 5.3*  --   --  3.1 5.7*    Liver Function Tests: Recent Labs  Lab 08/30/19 0345 08/31/19 0447 09/01/19 0513 09/02/19 0500 09/03/19 0453  ALBUMIN 2.2* 2.2* 2.1* 2.0* 1.9*   No results for input(s): LIPASE, AMYLASE in the last 168 hours. No results for input(s): AMMONIA in the last 168 hours.  CBC: Recent Labs  Lab 08/30/19 0345 08/31/19 0416 08/31/19 0447 09/01/19 0513 09/02/19 0500 09/03/19 0453  WBC 26.2*  --  22.0* 21.9* 23.8* 31.5*  HGB 9.1* 9.9* 8.8* 8.6* 8.2* 8.7*  HCT 32.3* 29.0* 31.7* 31.3* 31.2* 33.5*  MCV 75.8*  --  78.7* 80.7 84.1 84.8  PLT 274  --  244 218 215 249    Cardiac Enzymes: No results for input(s): CKTOTAL, CKMB, CKMBINDEX, TROPONINI in the last 168 hours.  BNP: BNP (last 3 results) Recent Labs    07/19/19 0956 08/02/19 0957 08/26/2019 1424  BNP 298.7* 167.3* 106.1*    ProBNP (last 3 results) No results for input(s): PROBNP in the last 8760 hours.    Other results:  Imaging: DG CHEST PORT 1 VIEW  Result Date: 09/02/2019 CLINICAL DATA:  Reason for exam: PNA Hx of ascending aortic dissection, cad, chf, diabetes. Hx of mitral valve replacement, CABG, BENTALL procedure. EXAM: PORTABLE CHEST 1 VIEW COMPARISON:  Chest radiograph 08/31/2019 FINDINGS: Stable cardiomediastinal contours status post median sternotomy and valve replacement. Unchanged support apparatus. Low lung volumes with increasing consolidation at the right lung base.  Persistent left retrocardiac opacity. Probable bilateral pleural effusions. No pneumothorax. IMPRESSION: 1. Low lung volumes with increasing right lower lobe consolidation. 2. Persistent left retrocardiac opacity. 3. Probable small bilateral pleural effusions. Electronically Signed   By: Audie Pinto M.D.   On: 09/02/2019 11:37     Medications:     Scheduled Medications: . sodium chloride   Intravenous Once  . sodium chloride   Intravenous Once  . albuterol  2.5 mg Nebulization TID  . chlorhexidine  15 mL Mouth Rinse BID  . Chlorhexidine Gluconate Cloth  6 each Topical Q0600  . darbepoetin (ARANESP) injection - NON-DIALYSIS  100 mcg Subcutaneous Q Wed-1800  . feeding supplement (PRO-STAT SUGAR FREE 64)  30 mL Per Tube BID  . free water  100 mL Per Tube Q1H  . hydrocortisone sod succinate (SOLU-CORTEF) inj  50 mg Intravenous Q6H  . hydroxypropyl methylcellulose / hypromellose  1 drop Both Eyes TID  . insulin aspart  2-6 Units Subcutaneous Q6H  . mouth rinse  15 mL Mouth Rinse q12n4p  . pantoprazole sodium  40 mg Per Tube Daily  . warfarin  2.5 mg Oral ONCE-1800  . Warfarin - Pharmacist Dosing Inpatient   Does not apply q1800    Infusions: . sodium chloride Stopped (09/01/19 0922)  . amiodarone 30 mg/hr (09/03/19 0600)  . ceFEPime (MAXIPIME) IV 1 g (09/02/19 1128)  . feeding supplement (JEVITY 1.5 CAL/FIBER) 1,000 mL (09/02/19 0126)  . furosemide (LASIX) infusion 10 mg/hr (09/03/19 0600)  . norepinephrine (LEVOPHED) Adult infusion 10 mcg/min (09/03/19 1001)    PRN Medications: sodium chloride, acetaminophen, albuterol, bisacodyl, guaiFENesin, heparin, lip balm, ondansetron (ZOFRAN) IV, polyethylene glycol, sennosides   Assessment/Plan:   1. AKI on CKD stage 3: Baseline creatinine 1.4-1.6 Creatinine up to 2.7 at admission with marked hyperkalemia and hyponatremia in setting of use of torsemide, metolazone, and spironolactone at home.  He had urgent HD with improvement.   CVVHD stopped 12/24. Creatinine has been steadily worsening since then.  Today, BUN 149 with creatinine 3.07.  - No CVVH given very poor prognosis at this point (and do not think he would tolerate iHD).  2. Acute on chronic systolic CHF/cardiogenic shock: EF 30-35% on last echo.  [employer] CRT-D, LV not on due to diaphragmatic stimulation.  Suspect mixed septic/cardiogenic shock at this point with fever and suspected RLL PNA.  MAP stable on norepinephrine 10.  Co-ox likely inadequate today.  CVP 10-11 but not much UOP on Lasix gtt at 10 mg/hr and renal function worsening.   - Continue norepinephrine, titrated for MAP > 65.  Resend co-ox.  - Amiodarone gtt for rate control.  - CVP 10-11, continue current Lasix gtt 10 mg/hr but with worsening renal function, suspect that we will get worsening volume overload through the day.  Not starting back on CVVH given poor  prognosis, no good end point.   - Hold midodrine while on NE.  3. VT: Patient has [employer] CRT-D device, LV lead off due to diaphragmatic pacing.  Patient has has runs of VT, treated w/ amiodarone gtt. VT quiescent.  - As above, transitioned to amiodarone gtt for rate control.  4. Digoxin toxicity: Severely elevated digoxin level initially, had digibind and HD.  - bradycardia improved 5. CAD: Last cath in 2016 with patent native coronaries, probably occluded SVG-RCA.  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 INR 2.6 today.  - Continue warfarin, off heparin gtt (INR goal 2.5-3.5).  7. Atrial fibrillation: Chronic.  Higher rate in 110s this morning.  - Transitioned to amiodarone gtt for rate control.  8. Acute hypoxic respiratory failure: CHF/pulmonary edema, suspect HAP with fever.  - Attempt diuresis.  - Abx for PNA.  - He is DNI.  9. Hypernatremia: Na 149, starting to trend down with free water.  10. ID: Fever to 102.9, ?HAP, suspect now with septic shock. Possible RLL PNA on CXR.  - Cefepime started.  - Cultures  sent.   Prognosis appears increasingly poor. Need discussions around code status, ?transition to comfort care.   CRITICAL CARE Performed by: Loralie Champagne  Total critical care time: 40 minutes  Critical care time was exclusive of separately billable procedures and treating other patients.  Critical care was necessary to treat or prevent imminent or life-threatening deterioration.  Critical care was time spent personally by me on the following activities: development of treatment plan with patient and/or surrogate as well as nursing, discussions with consultants, evaluation of patient's response to treatment, examination of patient, obtaining history from patient or surrogate, ordering and performing treatments and interventions, ordering and review of laboratory studies, ordering and review of radiographic studies, pulse oximetry and re-evaluation of patient's condition.   Length of Stay: [place]  09/03/2019, 10:16 AM  Advanced Heart Failure Team Pager [phone] (M-F; 7a - 4p)  Please contact [facility] for night-coverage after hours (4p -7a ) and weekends on [email or web address]

## 2019-09-03 NOTE — Progress Notes (Signed)
eLink Physician-Brief Progress Note Patient Name: Lance Cook DOB: [birth year] MRN: [id number]   Date of Service  09/03/2019  HPI/Events of Note  Patient congested - Request for Guaifenesin.   eICU Interventions  Will order: 1. Guaifenesin Solution 10 mL per tube Q 4 hours PRN congestion.      Intervention Category Major Interventions: Other:  Lysle Dingwall 09/03/2019, 5:35 AM

## 2019-09-03 NOTE — Progress Notes (Signed)
Patient still has an NG tube in place.  Bipap is contraindicated at this time.  Will continue to monitor patient.

## 2019-09-03 NOTE — Progress Notes (Signed)
Pharmacy Antibiotic Note  Lance Cook is a 59 y.o. male admitted on 08/29/2019. Pharmacy has been consulted for vancomycin dosing for pneumonia. Pt is febrile with Tmax 102.9 and WBC is elevated at 31.5. SCr is elevated at 3.07  Plan: Vancomycin 1gm IV Q48H F/u renal fxn, C&S, clinical status and peak/trough at SS  Height: 5' 2.21" (158 cm) Weight: 149 lb 7.6 oz (67.8 kg) IBW/kg (Calculated) : 55.07  Temp (24hrs), Avg:100.1 F (37.8 C), Min:97.6 F (36.4 C), Max:102.6 F (39.2 C)  Recent Labs  Lab 08/30/19 0345 08/31/19 0447 09/01/19 0513 09/01/19 1004 09/01/19 1700 09/02/19 0500 09/03/19 0453  WBC 26.2* 22.0* 21.9*  --   --  23.8* 31.5*  CREATININE 2.39* 2.55* 2.49* 2.44* 2.58* 2.74* 3.07*    Estimated Creatinine Clearance: 22.3 mL/min (A) (by C-G formula based on SCr of 3.07 mg/dL (H)).    Allergies  Allergen Reactions  . Feraheme [Ferumoxytol] Shortness Of Breath    SOB/Tachycardia  . Penicillins Hives and Rash    Has patient had a PCN reaction causing immediate rash, facial/tongue/throat swelling, SOB or lightheadedness with hypotension: Yes Has patient had a PCN reaction causing severe rash involving mucus membranes or skin necrosis: Yes Has patient had a PCN reaction that required hospitalization unsure Has patient had a PCN reaction occurring within the last 10 years: 2010-2012 If all of the above answers are "NO", then may proceed with Cephalosporin use.  [name] Other (See Comments)    CDIFF, when taking oral tablets   . Tape Other (See Comments) and Itching    Skin irritation   Thank you for allowing pharmacy to be a part of this patient's care.  Safiya Girdler, Rande Lawman 09/03/2019 7:29 PM

## 2019-09-04 ENCOUNTER — Inpatient Hospital Stay (HOSPITAL_COMMUNITY): Payer: Medicare HMO

## 2019-09-04 DIAGNOSIS — Z515 Encounter for palliative care: Secondary | ICD-10-CM

## 2019-09-04 DIAGNOSIS — I1310 Hypertensive heart and chronic kidney disease without heart failure, with stage 1 through stage 4 chronic kidney disease, or unspecified chronic kidney disease: Secondary | ICD-10-CM

## 2019-09-04 DIAGNOSIS — Z66 Do not resuscitate: Secondary | ICD-10-CM

## 2019-09-04 LAB — COOXEMETRY PANEL
Carboxyhemoglobin: 1.7 % — ABNORMAL HIGH (ref 0.5–1.5)
Carboxyhemoglobin: 1.6 % — ABNORMAL HIGH (ref 0.5–1.5)
Methemoglobin: 1.3 % (ref 0.0–1.5)
Methemoglobin: 0.9 % (ref 0.0–1.5)
O2 Saturation: 48.3 %
O2 Saturation: 57.2 %
Total hemoglobin: 8.3 g/dL — ABNORMAL LOW (ref 12.0–16.0)
Total hemoglobin: 8.6 g/dL — ABNORMAL LOW (ref 12.0–16.0)

## 2019-09-04 LAB — RENAL FUNCTION PANEL
Albumin: 1.9 g/dL — ABNORMAL LOW (ref 3.5–5.0)
Anion gap: 14 (ref 5–15)
BUN: 164 mg/dL — ABNORMAL HIGH (ref 6–20)
CO2: 27 mmol/L (ref 22–32)
Calcium: 7.7 mg/dL — ABNORMAL LOW (ref 8.9–10.3)
Chloride: 104 mmol/L (ref 98–111)
Creatinine, Ser: 3.43 mg/dL — ABNORMAL HIGH (ref 0.61–1.24)
GFR calc Af Amer: 22 mL/min — ABNORMAL LOW (ref >60)
GFR calc non Af Amer: 19 mL/min — ABNORMAL LOW (ref >60)
Glucose, Bld: 142 mg/dL — ABNORMAL HIGH (ref 70–99)
Phosphorus: 5.4 mg/dL — ABNORMAL HIGH (ref 2.5–4.6)
Potassium: 4.6 mmol/L (ref 3.5–5.1)
Sodium: 145 mmol/L (ref 135–145)

## 2019-09-04 LAB — CULTURE, RESPIRATORY W GRAM STAIN

## 2019-09-04 LAB — CBC
HCT: 31.4 % — ABNORMAL LOW (ref 39.0–52.0)
Hemoglobin: 8.5 g/dL — ABNORMAL LOW (ref 13.0–17.0)
MCH: 22.5 pg — ABNORMAL LOW (ref 26.0–34.0)
MCHC: 27.1 g/dL — ABNORMAL LOW (ref 30.0–36.0)
MCV: 83.1 fL (ref 80.0–100.0)
Platelets: 223 10*3/uL (ref 150–400)
RBC: 3.78 MIL/uL — ABNORMAL LOW (ref 4.22–5.81)
RDW: 29.3 % — ABNORMAL HIGH (ref 11.5–15.5)
WBC: 27.6 10*3/uL — ABNORMAL HIGH (ref 4.0–10.5)
nRBC: 3.3 % — ABNORMAL HIGH (ref 0.0–0.2)

## 2019-09-04 LAB — BASIC METABOLIC PANEL
Anion gap: 16 — ABNORMAL HIGH (ref 5–15)
BUN: 169 mg/dL — ABNORMAL HIGH (ref 6–20)
CO2: 28 mmol/L (ref 22–32)
Calcium: 8 mg/dL — ABNORMAL LOW (ref 8.9–10.3)
Chloride: 101 mmol/L (ref 98–111)
Creatinine, Ser: 3.51 mg/dL — ABNORMAL HIGH (ref 0.61–1.24)
GFR calc Af Amer: 21 mL/min — ABNORMAL LOW (ref >60)
GFR calc non Af Amer: 18 mL/min — ABNORMAL LOW (ref >60)
Glucose, Bld: 172 mg/dL — ABNORMAL HIGH (ref 70–99)
Potassium: 4 mmol/L (ref 3.5–5.1)
Sodium: 145 mmol/L (ref 135–145)

## 2019-09-04 LAB — GLUCOSE, CAPILLARY
Glucose-Capillary: 143 mg/dL — ABNORMAL HIGH (ref 70–99)
Glucose-Capillary: 159 mg/dL — ABNORMAL HIGH (ref 70–99)
Glucose-Capillary: 117 mg/dL — ABNORMAL HIGH (ref 70–99)

## 2019-09-04 LAB — PROTIME-INR
INR: 2.9 — ABNORMAL HIGH (ref 0.8–1.2)
Prothrombin Time: 30.2 seconds — ABNORMAL HIGH (ref 11.4–15.2)

## 2019-09-04 LAB — MAGNESIUM: Magnesium: 2.3 mg/dL (ref 1.7–2.4)

## 2019-09-04 MED ORDER — PRISMASOL BGK 4/2.5 32-4-2.5 MEQ/L IV SOLN
INTRAVENOUS | Status: DC
  Filled 2019-09-04 (×6): qty 5000

## 2019-09-04 MED ORDER — WARFARIN SODIUM 2.5 MG PO TABS
2.5000 mg | ORAL_TABLET | Freq: Once | ORAL | Status: AC
  Administered 2019-09-04: 2.5 mg via ORAL
  Filled 2019-09-04: qty 1

## 2019-09-04 MED ORDER — SODIUM CHLORIDE 0.9 % IV SOLN
2.0000 g | INTRAVENOUS | Status: DC
  Administered 2019-09-04: 2 g via INTRAVENOUS
  Filled 2019-09-04: qty 2

## 2019-09-04 MED ORDER — HEPARIN SODIUM (PORCINE) 1000 UNIT/ML DIALYSIS
1000.0000 [IU] | INTRAMUSCULAR | Status: DC | PRN
  Filled 2019-09-04: qty 6

## 2019-09-04 MED ORDER — PRISMASOL BGK 4/2.5 32-4-2.5 MEQ/L REPLACEMENT SOLN
Status: DC
  Filled 2019-09-04 (×4): qty 5000

## 2019-09-04 MED ORDER — FUROSEMIDE 10 MG/ML IJ SOLN
60.0000 mg | Freq: Three times a day (TID) | INTRAMUSCULAR | Status: DC
  Administered 2019-09-04 – 2019-09-05 (×3): 60 mg via INTRAVENOUS
  Filled 2019-09-04 (×5): qty 6

## 2019-09-04 MED ORDER — PRISMASOL BGK 4/2.5 32-4-2.5 MEQ/L REPLACEMENT SOLN
Status: DC
  Filled 2019-09-04 (×3): qty 5000

## 2019-09-04 MED ORDER — FENTANYL CITRATE (PF) 100 MCG/2ML IJ SOLN
12.5000 ug | INTRAMUSCULAR | Status: DC | PRN
  Administered 2019-09-04: 12.5 ug via INTRAVENOUS
  Filled 2019-09-04: qty 2

## 2019-09-04 MED ORDER — SODIUM CHLORIDE 0.9 % FOR CRRT
INTRAVENOUS_CENTRAL | Status: DC | PRN
  Filled 2019-09-04: qty 1000

## 2019-09-04 NOTE — Progress Notes (Signed)
NAME:  Lance Cook, MRN:  [id number], DOB:  [birth year], LOS: 14 ADMISSION DATE:  08/11/2019, CONSULTATION DATE:  08/02/2019 REFERRING MD:  Dr Sherry Ruffing, CHIEF COMPLAINT:  Hyperkalemia    Brief History   59 yo male with PMH significant for Bentall procedure, MVR, Afib, CAD, systolic and diastolic heart failure admitted with bradycardia and found to have severe life-threatening hyperkalemia with AKI and dig toxicity, PCCM consulted for admission and need for urgent iHD.  Hospital course included mechanical ventilation 12/18-12/23.    History of present illness   59 year old man, history of Bentall procedure, MVR, atrial fibrillation, CAD with chronic systolic and diastolic CHF.  Came to the ER with fatigue, weakness and hypotension.  He was found to be profoundly bradycardic with severe left bundle branch block and QRS widening on EKG.  Found to be severely hyperkalemic with new acute renal failure.  He is on digoxin at baseline and his digoxin level was 2.6.  He will be admitted emergently to the ICU for hemodialysis. He was given digibind for dig toxicity.   Significant Hospital Events   12/17 Admitted, urgent HD 12/18 had HD catheter in carotid removed in OR. Returned intubated 12/23 Extubated  Consults:  Nephrology Heart Failure  Procedures:  R IJ HD catheter 12/17 >> Misplaced in carotid, removed in OR 12/18 L IJ HD catheter 12/17 >>  ETI 12/18>>12/23  Significant Diagnostic Tests:  NM cardiac amyloid scan 10/21 >> equivocal for any evidence amyloidosis  ECHO 12/18 > LVEF 30 to 35%. No left ventricular hypertrophy. RV has normal systolic function. The mitral valve has been repaired/replaced. Mild mitral valve regurgitation. Moderate mitral stenosis. Tricuspid valve regurgitation mild-moderate. 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  S/P Bentall.  Moderately elevated pulmonary artery systolic pressure. . A pacer wire is visualized in the RV and RA. Right atrial pressure of  15 mmHg. History of mechanical aortic and mitral valves. Leaflets not well visualized, could consider fluroscopy of leaflets, but gradients not suggestive of obstruction.  CXR 12/17 > New left internal jugular central line tip in the SVC above the right atrium. No pneumothorax. No other change.  Micro Data:  COVID 12/17 > negative  Blood cultures 12/17 >NGTD   MRSA PCR 12/17 >negative  Urine culture 12/18 >  Negative  Antimicrobials:  Cefazolin 12/18 SCIP  Vanco 12/30 >> Cefepime 12/30 >>  Interim history/subjective:  Febrile, tachycardic, worsening hypotension overnight. Aspirated on liquids, so now NPO.  Objective   Blood pressure 98/66, pulse (!) 131, temperature 100.3 F (37.9 C), temperature source Axillary, resp. rate (!) 27, height 5' 2.21" (1.58 m), weight 68.2 kg, SpO2 100 %. CVP:  [3 mmHg-13 mmHg] 8 mmHg  FiO2 (%):  [40 %] 40 %   Intake/Output Summary (Last 24 hours) at 09/04/2019 [id number] Last data filed at 09/04/2019 0800 Gross per 24 hour  Intake 2210.92 ml  Output 1250 ml  Net 960.92 ml   Filed Weights   09/02/19 0500 09/03/19 0409 09/04/19 0458  Weight: 65.9 kg 67.8 kg 68.2 kg   Examination: General: ill-appearing man laying in bed in NAD, appears older than stated age, very frail. On Bipap Neck: right sided scar without erythema HEENT: [place]/AT, dusky appearing lips Neuro: fatigued, sleeping, arousable to stimulation but not awake and talking,  CV:  [id number], mechanical clicks, tachycardic, regular rhythm PULM: tachypneic, weak wet-sounding cough with thick secretions when NT suctioned GI: soft, NT, ND Extremities: dependent edema, no cyanosis Skin: stasis dermatatitis changes b/l shins, no rashes  Bedside US: very small simple appearing dependent bilateral pleural effusions <1cm  Resolved Hospital Problem list   Coagulopathy with elevated INR Hyperkalemia Digoxin Toxicity  Assessment & Plan:   Acute hypoxemic respiratory failure, improved with diuresis. CXR  with likely b/l dependent effusions, RLL pneumonia.  -Extubated 12/23 Tachycardic since 12/31  Tachypneic since  12/28 ish Febrile, improved Now on Bipap  Plan: -con't aggressive pulmonary hygiene- NT suctioning, IS if able -nebs PRN -con't antibiotics; respiratory culture obtained 1/1 -con't diuresis -con't cefepime for presumed RLL HAP; con't to follow blood cultures vanc per pharm consult -con't supplemental O2 and titrate down as tolerated Cont lasix, diuril   In Dr. Cordelia Pen previous discussions with patient, he does not want reintubation/ mechanical ventilator, even if that means he would not survive this hospitalization.  I discussed with Dr Loanne Drilling to clarify, patient was very clear.     Septic shock 2/2 pneumonia -levophed as required to maintain MAP >65 -Respiratory culture 1/1  -Continue to follow blood cultures -Continue cefepime, vanc -Unfortunately unable to perform thoracentesis given small volume of fluid -on stress steroids  Right neck hematoma secondary to a right IJ hemodialysis catheter placement -Status post intervention per vascular surgery 08/20/2019 Plan: -con't to monitor wound- appears stable  Acute encephalopathy,  Likely related to uremia.  Plan: -con't delirium precautions -mobility once appropriate   AKI on CKD 3- possible cardiorenal syndrome - S/p CRRT 12/18-12/24  - Etiology of his renal failure likely includes use of diuretics, poor p.o. intake, shock, chronic heart failure Plan -Appreciate nephrology's assistance -Continue to monitor I's/O -Daily renal labs -Avoid nephrotoxic meds and renally dose medications -try to reduce volume as much as possible Poor prognosis, HD will not change course per renal.   Cardiogenic Shock Acute on Chronic systolic and diastolic CHF (EF 99991111). S/p resynchronization therapy with left pacing lead, but turned off 2/2 diaphragmatic stimulation. CAD Mechanical mitral valve Chronic atrial fibrillation  H/o VT s/p AICD/pacer  tachycardic  P: -Continue Coumadin dosing per pharmacy.  INR previously ordered for CBC insertion.  Heparin stopped cont Warfarin.  -Continue amiodarone IV -Goal potassium greater than 4 magnesium greater than 2 -Stoped midodrine today given Levophed requirements -Monitor on telemetry    DM type 2 Plan -Accu-Cheks with sliding scale insulin as needed -Goal BG 100 4181 admitted to the ICU  Anemia Plan -Transfuse for hemoglobin less than 7 -Iron transfusion received on 12/31  Deconditioning -chronic and critical illness -Appreciate PT and OT's assistance, but unfortunately today due to hemodynamic instability he is appropriate for mobility  Hypernatremia- improving --Continue fw flushes, decrease if PM BMP improving -Daily BMP  Discussed poor prognosis with wife. Palliative care to meet with patients family.   Best practice:  Diet: tube feeds per recs: tolerating Pain/Anxiety/Delirium protocol (if indicated): na VAP protocol (if indicated): na DVT prophylaxis: warfarin, pharmacy to dose. Heparin drip d/ced  GI prophylaxis: PPI Glucose control: SSI Mobility: PT/OT Code Status: DNI, otherwise Ok for Chest compressions.  Family Engagement: discussed with wife at bedside. Disposition: ICU  Labs   CBC: Recent Labs  Lab 08/31/19 0447 09/01/19 0513 09/02/19 0500 09/03/19 0453 09/04/19 0356  WBC 22.0* 21.9* 23.8* 31.5* 27.6*  HGB 8.8* 8.6* 8.2* 8.7* 8.5*  HCT 31.7* 31.3* 31.2* 33.5* 31.4*  MCV 78.7* 80.7 84.1 84.8 83.1  PLT 244 218 215 249 [id number]    Basic Metabolic Panel: Recent Labs  Lab 08/31/19 0447 09/01/19 0513 09/01/19 1700 09/02/19 0500 09/03/19 0453 09/03/19 1956 09/04/19 0356  NA 151* 158* 159* 157* 149* 145 145  K 3.9 2.5* 3.1* 4.9 4.9 4.8 4.6  CL 109 109 112* 112* 105 104 104  CO2 31 38* 38* 36* 31 29 27   GLUCOSE 113* 111* 110* 113* 217* 146* 142*  BUN 118* 120* 125* 134* 149* 159* 164*  CREATININE 2.55* 2.49* 2.58*  2.74* 3.07* 3.31* 3.43*  CALCIUM 9.2 8.8* 8.5* 8.2* 7.7* 7.5* 7.7*  MG 2.9* 2.4 2.3 2.2 2.2  --  2.3  PHOS 5.5* 5.3*  --  3.1 5.7*  --  5.4*   GFR: Estimated Creatinine Clearance: 20 mL/min (A) (by C-G formula based on SCr of 3.43 mg/dL (H)). Recent Labs  Lab 08/31/19 0447 09/01/19 0513 09/02/19 0500 09/03/19 0453 09/04/19 0356  PROCALCITON 0.40  --   --   --   --   WBC 22.0* 21.9* 23.8* 31.5* 27.6*    Liver Function Tests: Recent Labs  Lab 08/31/19 0447 09/01/19 0513 09/02/19 0500 09/03/19 0453 09/04/19 0356  ALBUMIN 2.2* 2.1* 2.0* 1.9* 1.9*    ABG    Component Value Date/Time   PHART 7.435 08/31/2019 0416   PCO2ART 52.0 (H) 08/31/2019 0416   PO2ART 95.0 08/31/2019 0416   HCO3 35.1 (H) 08/31/2019 0416   TCO2 37 (H) 08/31/2019 0416   ACIDBASEDEF 1.0 01/26/2015 0425   O2SAT 48.3 09/04/2019 0410     Coagulation Profile: Recent Labs  Lab 08/31/19 0447 09/01/19 0513 09/02/19 0500 09/03/19 0453 09/04/19 0356  INR 2.2* 1.7* 2.3* 2.6* 2.9*    CBG: Recent Labs  Lab 09/03/19 0545 09/03/19 1129 09/03/19 1703 09/03/19 2317 09/04/19 0629  GLUCAP 190* 130* 146* 157* 143*     Collier Bullock, MD 09/04/19 9:07 AM [facility]

## 2019-09-04 NOTE — Progress Notes (Signed)
Daily Progress Note   Patient Name: Lance Cook       Date: 09/04/2019 DOB: [birth year]  Age: 59 y.o. MRN#: [id number] Attending Physician: Julian Hy, DO Primary Care Physician: [name], MD Admit Date: 08/22/2019  Reason for Consultation/Follow-up: Establishing goals of care and Psychosocial/spiritual support  Subjective: Met with patient, wife and her two Cook at bedside.  Then met privately with wife, Cook, and ICU RN.  Discussed current condition. Prayed.  Lance Cook, ICU RN did and excellent job of eloquently answering questions Lance Cook asked.  We discussed not coding Lance Cook as a protective measure.  Chest compressions and shock would only bring him harm and no benefit.  Lance Cook understood.  We talked about the goal of stabilization vs comfort.  Stabilization would represent doing more aggressive care as we have been doing.  It is probable that Lance Cook would pass in days to weeks anyway.  Comfort would mean stopping aggressive care and allowing his body to die naturally.  He would likely pass in hours to days with [facility] and her Cook by his side.  How does Lance Cook want him to spend his time?  Lance Cook and her Cook consented to DNR.  The requested that we pause on initiating dialysis.  Lance Cook needs time to process.  We will talk again tomorrow.    Lance Cook are very supportive and level headed.  They plan to help Lance Cook move to [place] to be near them.   Assessment: Patient on BiPAP.  Intermittent fever, very tachycardic, tachypnic. Patient appears to be suffering.   Patient Profile/HPI:  59 y.o. 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  He was found to be severely bradycardic with hyperkalemia and acute on chronic renal failure.  He developed cardiogenic shock and required urgent intubation and continuous renal replacement therapy.  He has since been able to wean from the vent.  He is still considered volume overloaded.  He is no longer on CRRT.  Diuresis is being attempted with IV diuretic drip.  The patient remains critically weak and deconditioned.  He is having great difficulty handling his secretions.  Core track is in  place for nutrition.  He is felt to have cardiorenal syndrome.  If IV diuretic diuresis is not productive continue renal replacement therapy may be reinitiated.  Length of Stay: 17  Current Medications: Scheduled Meds:  . sodium chloride   Intravenous Once  . sodium chloride   Intravenous Once  . albuterol  2.5 mg Nebulization TID  . chlorhexidine  15 mL Mouth Rinse BID  . Chlorhexidine Gluconate Cloth  6 each Topical Q0600  . darbepoetin (ARANESP) injection - NON-DIALYSIS  100 mcg Subcutaneous Q Wed-1800  . feeding supplement (PRO-STAT SUGAR FREE 64)  30 mL Per Tube BID  . furosemide  60 mg Intravenous Q8H  . hydrocortisone sod succinate (SOLU-CORTEF) inj  50 mg Intravenous Q6H  . hydroxypropyl methylcellulose / hypromellose  1 drop Both Eyes TID  . insulin aspart  2-6 Units Subcutaneous Q6H  . mouth rinse  15 mL Mouth Rinse q12n4p  . pantoprazole sodium  40 mg Per Tube Daily  . warfarin  2.5 mg Oral ONCE-1800  . Warfarin - Pharmacist Dosing Inpatient   Does not apply q1800    Continuous Infusions: .  prismasol BGK 4/2.5    .  prismasol BGK 4/2.5    . sodium chloride Stopped (09/01/19 0922)  . amiodarone 60 mg/hr (09/04/19 1120)  . ceFEPime (MAXIPIME) IV    . chlorothiazide (DIURIL) IV 250 mg (09/04/19 1107)  . feeding supplement (JEVITY 1.5 CAL/FIBER) Stopped (09/04/19 0031)  . norepinephrine (LEVOPHED) Adult infusion 15 mcg/min  (09/04/19 1100)  . prismasol BGK 4/2.5    . vancomycin Stopped (09/03/19 2319)    PRN Meds: sodium chloride, acetaminophen, albuterol, bisacodyl, guaiFENesin, heparin, lip balm, ondansetron (ZOFRAN) IV, polyethylene glycol, sennosides, sodium chloride  Physical Exam        Chronically ill appearing male, non-responsive to voice.  On Bipap CV tachycardic Resp on Bipap Abdomen thin, nt, nd   Vital Signs: BP 109/73   Pulse (!) 137   Temp 100.3 F (37.9 C) (Axillary)   Resp (!) 38   Ht 5' 2.21" (1.58 m)   Wt 68.2 kg   SpO2 100%   BMI 27.32 kg/m  SpO2: SpO2: 100 % O2 Device: O2 Device: Bi-PAP O2 Flow Rate: O2 Flow Rate (L/min): 4 L/min  Intake/output summary:   Intake/Output Summary (Last 24 hours) at 09/04/2019 1133 Last data filed at 09/04/2019 1100 Gross per 24 hour  Intake 2037.84 ml  Output 1360 ml  Net 677.84 ml   LBM: Last BM Date: 09/04/19 Baseline Weight: Weight: 80.3 kg Most recent weight: Weight: 68.2 kg       Palliative Assessment/Data: 10%      Patient Active Problem List   Diagnosis Date Noted  . Palliative care by specialist   . Acute combined systolic and diastolic heart failure ([place])   . AKI (acute kidney injury) ([place])   . Respiration abnormal   . Protein-calorie malnutrition, severe 08/23/2019  . Cardiogenic shock ([place])   . Central venous catheter in place   . Hyperkalemia 08/05/2019  . Bradycardia   . Acute on chronic systolic CHF (congestive heart failure), NYHA class 4 ([place]) 06/21/2019  . Acute on chronic diastolic (congestive) heart failure ([place]) 04/01/2019  . Acute on chronic congestive heart failure ([place])   . Anasarca   . Pressure injury of skin 02/14/2019  . Acute on chronic heart failure ([place]) 02/13/2019  . Benign neoplasm of ascending colon   . Benign neoplasm of rectum   . Benign neoplasm of rectosigmoid junction   .  Benign neoplasm of sigmoid colon   . DM type 2 with diabetic dyslipidemia ([place]) 04/26/2018  . Lower extremity  edema 03/08/2018  . Other fatigue 01/28/2018  . Chronic anticoagulation 01/05/2018  . Insomnia 11/23/2017  . Unilateral vocal cord paralysis 11/30/2016  . Leg swelling 11/28/2016  . S/P MVR (mitral valve replacement)   . Acute on chronic diastolic CHF (congestive heart failure) ([place]) 08/27/2016  . Chronic pain syndrome 07/10/2016  . Chronic diarrhea   . Benign neoplasm of transverse colon   . Scrotal swelling 05/30/2016  . Musculoskeletal pain 01/07/2016  . Personal history of malignant neoplasm of testis 07/12/2015  . Long term current use of opiate analgesic 07/11/2015  . Long term prescription opiate use 07/11/2015  . Opiate use (22.5 MME/day) 07/11/2015  . Opiate dependence ([place]) 07/11/2015  . Goals of care, counseling/discussion 07/11/2015  . Chronic low back pain (midline) 07/11/2015  . Lumbar facet syndrome 07/11/2015  . Chronic lower extremity pain (Left) 07/11/2015  . Adult BMI 30+ 06/19/2015  . Obesity (BMI 30-39.9) 06/19/2015  . Hypomagnesemia 02/08/2015  . Physical deconditioning 02/08/2015  . Acute encephalopathy 02/05/2015  . Enteritis due to Clostridium difficile 01/30/2015  . Pneumonia due to infectious organism 01/29/2015  . Acute respiratory failure ([place])   . 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 16/03/3709  . Chronic diastolic congestive heart failure ([place])   . Type 2 diabetes mellitus with other specified complication ([place]) 62/69/4854  . 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 Venous insufficiency of both lower extremities 04/02/2014  . Morbid obesity ([place]) 04/02/2014  . Diabetes mellitus ([place]) 03/14/2014  . Personal history of transient ischemic attack (TIA), and cerebral infarction without residual deficits 02/23/2014  . Malignant neoplasm of testis ([place]) 02/23/2014  . H/O TIA (transient ischemic attack) and stroke 02/23/2014  . HTN (hypertension) 02/23/2014  . Lymphadenopathy  02/23/2014  . S/P AVR (aortic valve replacement) 02/23/2014  . Encounter for therapeutic drug monitoring 02/02/2014  . Long term current use of anticoagulant therapy 07/15/2012  . Hypogonadism male 04/08/2012  . 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 Testicular hypofunction 04/08/2012  . Male hypogonadism 04/08/2012  . Other testicular hypofunction 04/08/2012  . History of colonic polyps 12/23/2011  . Testicular cancer ([place])   . TIA (transient ischemic attack)   . OSA (obstructive sleep apnea)   . Lumbar spinal stenosis (Severe L4-5) 10/08/2011  . ED (erectile dysfunction) of organic origin 10/08/2011  . Lumbar canal stenosis 10/08/2011  . Low back pain 10/08/2011  . Spinal stenosis of lumbar region without neurogenic claudication 10/08/2011  . CAD (coronary artery disease)   . Spondylosis 04/29/2010  . DYSLIPIDEMIA 01/15/2010  . Chronic venous hypertension with ulcer ([place]) 01/15/2010  . Essential hypertension 12/14/2009  . Gout 11/01/2009  . Anemia, iron deficiency 11/01/2009  . MYOCARDIAL INFARCTION, HX OF 11/01/2009  . Atrial fibrillation, chronic 11/01/2009  . PERIPHERAL EDEMA 11/01/2009  . Heart valve replaced by other means 11/01/2009  . Presence of other heart-valve replacement 11/01/2009  . Ascending aortic dissection ([place]) 07/14/2008  . 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 Venous (peripheral) insufficiency 02/03/2005    Palliative Care Plan    Recommendations/Plan:  Code status changed to DNR  Pause on re-initiating dialysis for now  Patient needs to consider options with the support of her Cook  PMT will follow up 1/4.  Goals of Care and Additional Recommendations:  Limitations on Scope of Treatment: Full  Scope Treatment hopefully leaning towards comfort tomorrow.  Code Status:  DNR  Prognosis:   < 2 weeks   Discharge Planning:  Anticipated Hospital Death  Care plan was discussed with CCM MD, ICU RN, Wife and her  Cook.  Thank you for allowing the Palliative Medicine Team to assist in the care of this patient.  Total time spent:  60 min.     Greater than 50%  of this time was spent counseling and coordinating care related to the above assessment and plan.  Florentina Jenny, PA-C Palliative Medicine  Please contact Palliative MedicineTeam phone at [phone] for questions and concerns between 7 am - 7 pm.   Please see AMION for individual provider pager numbers.

## 2019-09-04 NOTE — Progress Notes (Signed)
Patient ID: Lance Cook, male   DOB: Jul 04, 1961, 59 y.o.   MRN: [id number]    Advanced Heart Failure Rounding Note   Subjective:    CVVHD stopped 12/24. Transitioned back to IV lasix after CVVHD discontinuation but developed worsening renal function and diuretics held which lead to progressive volume overload and hypoxic respiratory failure.  Now with fever to 102.6 overnight and suspected RLL PNA on CXR (on vancomycin/cefepime).  Back on Bipap this morning. Pt has refused intubation. Partial CODE. Lasix gtt stopped this morning and bolus Lasix ordered.  CVP rising, up to 14.  Co-ox 48% on norepinephrine 12.  HR around 130 on amiodarone gtt 30, probably atrial flutter.   BUN/creatinine continue to rise, I/O +1352.   He will awaken and follow commands.   Sodium 145 with increased free water.     INR 2.9, now off heparin gtt.   Objective:   Weight Range:  Vital Signs:   Temp:  [98.7 F (37.1 C)-102.6 F (39.2 C)] 100.3 F (37.9 C) (01/03 0801) Pulse Rate:  [115-134] 134 (01/03 0915) Resp:  [27-46] 38 (01/03 0915) BP: (93-116)/(53-81) 95/65 (01/03 0915) SpO2:  [94 %-100 %] 100 % (01/03 0915) FiO2 (%):  [40 %] 40 % (01/03 0800) Weight:  [68.2 kg] 68.2 kg (01/03 0458) Last BM Date: 09/03/19  Weight change: Filed Weights   09/02/19 0500 09/03/19 0409 09/04/19 0458  Weight: 65.9 kg 67.8 kg 68.2 kg    Intake/Output:   Intake/Output Summary (Last 24 hours) at 09/04/2019 0933 Last data filed at 09/04/2019 0800 Gross per 24 hour  Intake 2204.3 ml  Output 1250 ml  Net 954.3 ml     Physical Exam: CVP 14 General: Frail, on Bipap Neck: JVP 12-14 cm, no thyromegaly or thyroid nodule.  Lungs: Clear to auscultation bilaterally with normal respiratory effort. CV: Nondisplaced PMI.  Heart tachy, regular with mechanical S1S2, no S3/S4, 2/6 SEM RUSB.  1+ edema to knees.    Abdomen: Soft, nontender, no hepatosplenomegaly, no distention.  Skin: Intact without lesions or rashes.    Neurologic: Drowsy Extremities: No clubbing or cyanosis.  HEENT: Normal.   Telemetry: ?atrial flutter 130 Personally reviewed   Labs: Basic Metabolic Panel: Recent Labs  Lab 08/31/19 0447 09/01/19 0513 09/01/19 1700 09/02/19 0500 09/03/19 0453 09/03/19 1956 09/04/19 0356  NA 151* 158* 159* 157* 149* 145 145  K 3.9 2.5* 3.1* 4.9 4.9 4.8 4.6  CL 109 109 112* 112* 105 104 104  CO2 31 38* 38* 36* 31 29 27   GLUCOSE 113* 111* 110* 113* 217* 146* 142*  BUN 118* 120* 125* 134* 149* 159* 164*  CREATININE 2.55* 2.49* 2.58* 2.74* 3.07* 3.31* 3.43*  CALCIUM 9.2 8.8* 8.5* 8.2* 7.7* 7.5* 7.7*  MG 2.9* 2.4 2.3 2.2 2.2  --  2.3  PHOS 5.5* 5.3*  --  3.1 5.7*  --  5.4*    Liver Function Tests: Recent Labs  Lab 08/31/19 0447 09/01/19 0513 09/02/19 0500 09/03/19 0453 09/04/19 0356  ALBUMIN 2.2* 2.1* 2.0* 1.9* 1.9*   No results for input(s): LIPASE, AMYLASE in the last 168 hours. No results for input(s): AMMONIA in the last 168 hours.  CBC: Recent Labs  Lab 08/31/19 0447 09/01/19 0513 09/02/19 0500 09/03/19 0453 09/04/19 0356  WBC 22.0* 21.9* 23.8* 31.5* 27.6*  HGB 8.8* 8.6* 8.2* 8.7* 8.5*  HCT 31.7* 31.3* 31.2* 33.5* 31.4*  MCV 78.7* 80.7 84.1 84.8 83.1  PLT 244 218 215 249 223    Cardiac Enzymes: No results  for input(s): CKTOTAL, CKMB, CKMBINDEX, TROPONINI in the last 168 hours.  BNP: BNP (last 3 results) Recent Labs    07/19/19 0956 08/02/19 0957 08/15/2019 1424  BNP 298.7* 167.3* 106.1*    ProBNP (last 3 results) No results for input(s): PROBNP in the last 8760 hours.    Other results:  Imaging: DG CHEST PORT 1 VIEW  Result Date: 09/04/2019 CLINICAL DATA:  Pneumonia EXAM: PORTABLE CHEST 1 VIEW COMPARISON:  Chest radiograph 09/02/2019 FINDINGS: Stable cardiomediastinal contours. Unchanged support apparatus. Low lung volumes. Improved aeration in the bilateral lung bases. No pneumothorax or significant pleural effusion. IMPRESSION: Improved aeration in the  bilateral lung bases. Electronically Signed   By: Audie Pinto M.D.   On: 09/04/2019 07:42   DG CHEST PORT 1 VIEW  Result Date: 09/02/2019 CLINICAL DATA:  Reason for exam: PNA Hx of ascending aortic dissection, cad, chf, diabetes. Hx of mitral valve replacement, CABG, BENTALL procedure. EXAM: PORTABLE CHEST 1 VIEW COMPARISON:  Chest radiograph 08/31/2019 FINDINGS: Stable cardiomediastinal contours status post median sternotomy and valve replacement. Unchanged support apparatus. Low lung volumes with increasing consolidation at the right lung base. Persistent left retrocardiac opacity. Probable bilateral pleural effusions. No pneumothorax. IMPRESSION: 1. Low lung volumes with increasing right lower lobe consolidation. 2. Persistent left retrocardiac opacity. 3. Probable small bilateral pleural effusions. Electronically Signed   By: Audie Pinto M.D.   On: 09/02/2019 11:37     Medications:     Scheduled Medications: . sodium chloride   Intravenous Once  . sodium chloride   Intravenous Once  . albuterol  2.5 mg Nebulization TID  . chlorhexidine  15 mL Mouth Rinse BID  . Chlorhexidine Gluconate Cloth  6 each Topical Q0600  . darbepoetin (ARANESP) injection - NON-DIALYSIS  100 mcg Subcutaneous Q Wed-1800  . feeding supplement (PRO-STAT SUGAR FREE 64)  30 mL Per Tube BID  . furosemide  60 mg Intravenous Q8H  . hydrocortisone sod succinate (SOLU-CORTEF) inj  50 mg Intravenous Q6H  . hydroxypropyl methylcellulose / hypromellose  1 drop Both Eyes TID  . insulin aspart  2-6 Units Subcutaneous Q6H  . mouth rinse  15 mL Mouth Rinse q12n4p  . pantoprazole sodium  40 mg Per Tube Daily  . warfarin  2.5 mg Oral ONCE-1800  . Warfarin - Pharmacist Dosing Inpatient   Does not apply q1800    Infusions: . sodium chloride Stopped (09/01/19 0922)  . amiodarone 30 mg/hr (09/04/19 0800)  . ceFEPime (MAXIPIME) IV    . chlorothiazide (DIURIL) IV Stopped (09/04/19 0001)  . feeding supplement (JEVITY  1.5 CAL/FIBER) Stopped (09/04/19 0031)  . norepinephrine (LEVOPHED) Adult infusion 12 mcg/min (09/04/19 0800)  . vancomycin Stopped (09/03/19 2319)    PRN Medications: sodium chloride, acetaminophen, albuterol, bisacodyl, guaiFENesin, lip balm, ondansetron (ZOFRAN) IV, polyethylene glycol, sennosides   Assessment/Plan:   1. AKI on CKD stage 3: Baseline creatinine 1.4-1.6 Creatinine up to 2.7 at admission with marked hyperkalemia and hyponatremia in setting of use of torsemide, metolazone, and spironolactone at home.  He had urgent HD with improvement.  CVVHD stopped 12/24. Creatinine has been steadily worsening since then.  Today, BUN 164 with creatinine 3.4.  He is not responding well to IV Lasix.  - I do not think that he will tolerate CVVH well and I do not think there is a good end point, but his wife still wants full scope of care and patient is not awake enough for decision-making.  Plan currently is to place dialysis catheter and  restart CVVH.  If he cannot tolerate this, no further options.  2. Acute on chronic systolic CHF/cardiogenic shock: EF 30-35% on last echo.  [employer] CRT-D, LV lead not on due to diaphragmatic stimulation.  Suspect mixed septic/cardiogenic shock at this point with fever and suspected RLL PNA.  MAP stable on norepinephrine 12.  Co-ox lower at 48% this morning.  CVP 14 and without much UOP on Lasix gtt at 10 mg/hr.  Ongoing worsening of renal function.    - Increase norepinephrine to 15 and resend co-ox.  Will not add dobutamine with marked tachycardia and will not add milrinone with renal failure and low BP.  - Amiodarone gtt for rate control, increase to 60 mg/hr.  - Not responding to IV diuretics and his wife wants full scope of treatment.  As above, plan for CVVH again today.   - Hold midodrine while on NE.  3. VT: Patient has [employer] CRT-D device, LV lead off due to diaphragmatic pacing.  Patient has has runs of VT, treated w/ amiodarone gtt.  VT quiescent.  - As above, transitioned to amiodarone gtt for rate control.  4. Digoxin toxicity: Severely elevated digoxin level initially, had digibind and HD.  - bradycardia improved 5. CAD: Last cath in 2016 with patent native coronaries, probably occluded SVG-RCA.  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 INR 2.9 today.  - Continue warfarin, off heparin gtt (INR goal 2.5-3.5).  7. Atrial fibrillation: Chronic.  Higher rate in 130s this morning, suspect atrial flutter.  - Transitioned to amiodarone gtt for rate control, will increase to 60 mg/hr.  8. Acute hypoxic respiratory failure: CHF/pulmonary edema, suspect HCAP with fever.  - Attempt diuresis.  - vanco/cefepime for PNA.  - He is DNI.  9. Hypernatremia: Na 145, normalized with free water.  10. ID: Fever to 102.6, ?HCAP, suspect now with septic shock. Possible RLL PNA on CXR.  - Cefepime and vancomycin started.  - Cultures sent, NGTD.   Prognosis appears increasingly poor.  I suspect that he will not tolerate CVVH.  There have been extensive discussions with his wife and pastor, plan to continue full scope of care for now but will be DNI.   CRITICAL CARE Performed by: Loralie Champagne  Total critical care time: 40 minutes  Critical care time was exclusive of separately billable procedures and treating other patients.  Critical care was necessary to treat or prevent imminent or life-threatening deterioration.  Critical care was time spent personally by me on the following activities: development of treatment plan with patient and/or surrogate as well as nursing, discussions with consultants, evaluation of patient's response to treatment, examination of patient, obtaining history from patient or surrogate, ordering and performing treatments and interventions, ordering and review of laboratory studies, ordering and review of radiographic studies, pulse oximetry and re-evaluation of patient's condition.   Length of Stay:  [place]  09/04/2019, 9:33 AM  Advanced Heart Failure Team Pager [phone] (M-F; 7a - 4p)  Please contact [facility] for night-coverage after hours (4p -7a ) and weekends on [email or web address]

## 2019-09-04 NOTE — Progress Notes (Signed)
ANTICOAGULATION CONSULT NOTE - Follow Up Consult  Pharmacy Consult for Heparin, resume warfarin Indication: Hx St Jude MVR/AVR + Afib  Patient Measurements: Height: 5' 2.21" (158 cm) Weight: 150 lb 5.7 oz (68.2 kg) IBW/kg (Calculated) : 55.07 Heparin Dosing Weight: 70 kg  Vital Signs: Temp: 100.3 F (37.9 C) (01/03 0801) Temp Source: Axillary (01/03 0801) BP: 94/64 (01/03 0815) Pulse Rate: 133 (01/03 0815)  Labs: Recent Labs    09/01/19 1700 09/01/19 2155 09/02/19 0500 09/02/19 0725 09/03/19 0453 09/03/19 1956 09/04/19 0356  HGB   < >  --  8.2*  --  8.7*  --  8.5*  HCT  --   --  31.2*  --  33.5*  --  31.4*  PLT  --   --  215  --  249  --  223  LABPROT  --   --  24.8*  --  27.4*  --  30.2*  INR  --   --  2.3*  --  2.6*  --  2.9*  HEPARINUNFRC  --  0.21*  --  0.42 0.72*  --   --   CREATININE  --   --  2.74*  --  3.07* 3.31* 3.43*   < > = values in this interval not displayed.    Estimated Creatinine Clearance: 20 mL/min (A) (by C-G formula based on SCr of 3.43 mg/dL (H)).  Assessment: 59 yr old male on warfarin PTA for hx St Jude MVR + Afib - admitted with AKI and hyperkalemia requiring line replacement and start of CRRT - now off CRRT and heparin drip  PTA warfarin dosing 5 mg daily except 2.5 mg Tu, Th, Sun.  Heparin drip is off as INR was therapeutic yesterday, INR 2.9 today.   Goal of Therapy:  INR 2.5-3.5 Monitor platelets by anticoagulation protocol: Yes   Plan:  Coumadin 2.5 mg po x 1 tonight.  Given exaggerated response earlier this admission, and increasing use of amiodarone will dose with caution initially. Daily INR.  Nicoletta Dress, PharmD PGY2 Infectious Disease Pharmacy Resident   09/04/2019 8:32 AM

## 2019-09-04 NOTE — Progress Notes (Signed)
Lance Cook Progress Note    Assessment/ Plan:   Pt is a 59 y.o. yo male CHF, mechanical mitral and aortic valve A. fib, s/p VT arrest, DM, potassium of 8.4 associated with hypotension and AKI on admission.  #Severe hyperkalemia with EKG changes due to use of spironolactone, KCl and AKI: Received urgent dialysis on 12/17, started CRRT on 12/18 for fluid overload- stopped on 12/24.   #Acute on CKD stage III: Baseline creatinine around 1.3-1.6, SCr worseing.  Likely has cardiorenal syndrome.  His prognosis is poor--> I do not think another trial of CRRT would improve his overall prognosis; however as full scope of care desired we will provide another trial of CRRT per family wishes. D/w PCCM.     # Hypernatremia, originally sig hyponatremia.  Better with increased FWF.  #Acute on Chronic CHF, aortic and mitral valve replacement: S/p UF with CRRT.  AHF following and as above  #Hypotension/shock: back on pressors--> suspicion is for pneumonia at this time  #Acute respiratory failure/VDRF: resp status worsened with new HAP  Anemia- needs replete iron-  Has allergy listed, Tolerated test dose, getting Fe.   Coagulopathy, improved  #Dispo: prognosis is poor. Dialytic intervention is unlikely to change the overall trajectory.  I appreciate all consultants and palliative care involvement.     Subjective:    BUN/ CR worsening, full scope of care desired by wife.  On BiPaP this AM.      Objective:   BP 95/65   Pulse (!) 134   Temp 100.3 F (37.9 C) (Axillary)   Resp (!) 38   Ht 5' 2.21" (1.58 m)   Wt 68.2 kg   SpO2 100%   BMI 27.32 kg/m   Intake/Output Summary (Last 24 hours) at 09/04/2019 0945 Last data filed at 09/04/2019 0800 Gross per 24 hour  Intake 2204.3 ml  Output 1250 ml  Net 954.3 ml   Weight change: 0.4 kg  Physical Exam: Gen: cachectic, severe temporal wasting HEENT: in bed on BiPaP CVS: tachycardic, + LV heave Resp: tachypneic with rhonchi  bilaterally Abd: nontender Ext: 1+ RUE edema, anasarca of lower extremities  Imaging: DG CHEST PORT 1 VIEW  Result Date: 09/04/2019 CLINICAL DATA:  Pneumonia EXAM: PORTABLE CHEST 1 VIEW COMPARISON:  Chest radiograph 09/02/2019 FINDINGS: Stable cardiomediastinal contours. Unchanged support apparatus. Low lung volumes. Improved aeration in the bilateral lung bases. No pneumothorax or significant pleural effusion. IMPRESSION: Improved aeration in the bilateral lung bases. Electronically Signed   By: Audie Pinto M.D.   On: 09/04/2019 07:42   DG CHEST PORT 1 VIEW  Result Date: 09/02/2019 CLINICAL DATA:  Reason for exam: PNA Hx of ascending aortic dissection, cad, chf, diabetes. Hx of mitral valve replacement, CABG, BENTALL procedure. EXAM: PORTABLE CHEST 1 VIEW COMPARISON:  Chest radiograph 08/31/2019 FINDINGS: Stable cardiomediastinal contours status post median sternotomy and valve replacement. Unchanged support apparatus. Low lung volumes with increasing consolidation at the right lung base. Persistent left retrocardiac opacity. Probable bilateral pleural effusions. No pneumothorax. IMPRESSION: 1. Low lung volumes with increasing right lower lobe consolidation. 2. Persistent left retrocardiac opacity. 3. Probable small bilateral pleural effusions. Electronically Signed   By: Audie Pinto M.D.   On: 09/02/2019 11:37    Labs: BMET Recent Labs  Lab 08/29/19 0529 08/30/19 0345 08/31/19 0447 09/01/19 0513 09/01/19 1004 09/01/19 1700 09/02/19 0500 09/03/19 0453 09/03/19 1956 09/04/19 0356  NA 148* 151* 151* 158* 158* 159* 157* 149* 145 145  K 4.5 3.7 3.9 2.5* 3.2* 3.1*  4.9 4.9 4.8 4.6  CL 106 108 109 109 111 112* 112* 105 104 104  CO2 31 31 31  38* 38* 38* 36* 31 29 27   GLUCOSE 122* 117* 113* 111* 154* 110* 113* 217* 146* 142*  BUN 94* 112* 118* 120* 122* 125* 134* 149* 159* 164*  CREATININE 2.12* 2.39* 2.55* 2.49* 2.44* 2.58* 2.74* 3.07* 3.31* 3.43*  CALCIUM 9.5 9.4 9.2 8.8* 8.5*  8.5* 8.2* 7.7* 7.5* 7.7*  PHOS 4.6 4.2 5.5* 5.3*  --   --  3.1 5.7*  --  5.4*   CBC Recent Labs  Lab 09/01/19 0513 09/02/19 0500 09/03/19 0453 09/04/19 0356  WBC 21.9* 23.8* 31.5* 27.6*  HGB 8.6* 8.2* 8.7* 8.5*  HCT 31.3* 31.2* 33.5* 31.4*  MCV 80.7 84.1 84.8 83.1  PLT 218 215 249 223    Medications:    . sodium chloride   Intravenous Once  . sodium chloride   Intravenous Once  . albuterol  2.5 mg Nebulization TID  . chlorhexidine  15 mL Mouth Rinse BID  . Chlorhexidine Gluconate Cloth  6 each Topical Q0600  . darbepoetin (ARANESP) injection - NON-DIALYSIS  100 mcg Subcutaneous Q Wed-1800  . feeding supplement (PRO-STAT SUGAR FREE 64)  30 mL Per Tube BID  . furosemide  60 mg Intravenous Q8H  . hydrocortisone sod succinate (SOLU-CORTEF) inj  50 mg Intravenous Q6H  . hydroxypropyl methylcellulose / hypromellose  1 drop Both Eyes TID  . insulin aspart  2-6 Units Subcutaneous Q6H  . mouth rinse  15 mL Mouth Rinse q12n4p  . pantoprazole sodium  40 mg Per Tube Daily  . warfarin  2.5 mg Oral ONCE-1800  . Warfarin - Pharmacist Dosing Inpatient   Does not apply Bar Nunn, MD [facility] pgr [phone] 09/04/2019, 9:45 AM

## 2019-09-05 ENCOUNTER — Encounter (HOSPITAL_COMMUNITY): Payer: Medicare HMO

## 2019-09-05 DIAGNOSIS — I5041 Acute combined systolic (congestive) and diastolic (congestive) heart failure: Secondary | ICD-10-CM

## 2019-09-05 DIAGNOSIS — Z515 Encounter for palliative care: Secondary | ICD-10-CM

## 2019-09-05 LAB — GLUCOSE, CAPILLARY
Glucose-Capillary: 147 mg/dL — ABNORMAL HIGH (ref 70–99)
Glucose-Capillary: 159 mg/dL — ABNORMAL HIGH (ref 70–99)

## 2019-09-05 LAB — CULTURE, BLOOD (ROUTINE X 2)
Culture: NO GROWTH
Culture: NO GROWTH
Special Requests: ADEQUATE

## 2019-09-05 LAB — PROTIME-INR
INR: 4.6 (ref 0.8–1.2)
Prothrombin Time: 43.5 seconds — ABNORMAL HIGH (ref 11.4–15.2)

## 2019-09-05 LAB — CBC
HCT: 31.8 % — ABNORMAL LOW (ref 39.0–52.0)
Hemoglobin: 8.7 g/dL — ABNORMAL LOW (ref 13.0–17.0)
MCH: 22.5 pg — ABNORMAL LOW (ref 26.0–34.0)
MCHC: 27.4 g/dL — ABNORMAL LOW (ref 30.0–36.0)
MCV: 82.2 fL (ref 80.0–100.0)
Platelets: 204 10*3/uL (ref 150–400)
RBC: 3.87 MIL/uL — ABNORMAL LOW (ref 4.22–5.81)
RDW: 29.5 % — ABNORMAL HIGH (ref 11.5–15.5)
WBC: 23.8 10*3/uL — ABNORMAL HIGH (ref 4.0–10.5)
nRBC: 5.9 % — ABNORMAL HIGH (ref 0.0–0.2)

## 2019-09-05 LAB — RENAL FUNCTION PANEL
Albumin: 1.9 g/dL — ABNORMAL LOW (ref 3.5–5.0)
Anion gap: 17 — ABNORMAL HIGH (ref 5–15)
BUN: 181 mg/dL — ABNORMAL HIGH (ref 6–20)
CO2: 29 mmol/L (ref 22–32)
Calcium: 8.1 mg/dL — ABNORMAL LOW (ref 8.9–10.3)
Chloride: 103 mmol/L (ref 98–111)
Creatinine, Ser: 3.73 mg/dL — ABNORMAL HIGH (ref 0.61–1.24)
GFR calc Af Amer: 19 mL/min — ABNORMAL LOW (ref >60)
GFR calc non Af Amer: 17 mL/min — ABNORMAL LOW (ref >60)
Glucose, Bld: 170 mg/dL — ABNORMAL HIGH (ref 70–99)
Phosphorus: 6.6 mg/dL — ABNORMAL HIGH (ref 2.5–4.6)
Potassium: 3.8 mmol/L (ref 3.5–5.1)
Sodium: 149 mmol/L — ABNORMAL HIGH (ref 135–145)

## 2019-09-05 LAB — MAGNESIUM: Magnesium: 2.3 mg/dL (ref 1.7–2.4)

## 2019-09-05 LAB — COOXEMETRY PANEL
Carboxyhemoglobin: 1.6 % — ABNORMAL HIGH (ref 0.5–1.5)
Methemoglobin: 1.5 % (ref 0.0–1.5)
O2 Saturation: 49 %
Total hemoglobin: 9 g/dL — ABNORMAL LOW (ref 12.0–16.0)

## 2019-09-05 MED ORDER — HALOPERIDOL LACTATE 5 MG/ML IJ SOLN: 0.5000 mg | INTRAMUSCULAR | Status: DC | PRN

## 2019-09-05 MED ORDER — MORPHINE BOLUS VIA INFUSION
4.0000 mg | INTRAVENOUS | Status: DC | PRN
  Filled 2019-09-05: qty 8

## 2019-09-05 MED ORDER — SODIUM CHLORIDE 0.9 % IV SOLN: 250.0000 mL | INTRAVENOUS | Status: DC | PRN

## 2019-09-05 MED ORDER — MORPHINE 100MG IN NS 100ML (1MG/ML) PREMIX INFUSION
15.0000 mg/h | INTRAVENOUS | Status: DC
  Administered 2019-09-05: 1 mg/h via INTRAVENOUS
  Filled 2019-09-05: qty 100

## 2019-09-05 MED ORDER — BIOTENE DRY MOUTH MT LIQD: 15.0000 mL | OROMUCOSAL | Status: DC | PRN

## 2019-09-05 MED ORDER — SODIUM CHLORIDE 0.9% FLUSH: 3.0000 mL | Freq: Two times a day (BID) | INTRAVENOUS | Status: DC

## 2019-09-05 MED ORDER — GLYCOPYRROLATE 1 MG PO TABS: 1.0000 mg | ORAL_TABLET | ORAL | Status: DC | PRN

## 2019-09-05 MED ORDER — FUROSEMIDE 10 MG/ML IJ SOLN
160.0000 mg | Freq: Two times a day (BID) | INTRAVENOUS | Status: DC
  Administered 2019-09-05: 160 mg via INTRAVENOUS
  Filled 2019-09-05 (×2): qty 16

## 2019-09-05 MED ORDER — LORAZEPAM 2 MG/ML PO CONC: 1.0000 mg | ORAL | Status: DC | PRN

## 2019-09-05 MED ORDER — LORAZEPAM 2 MG/ML IJ SOLN: 0.5000 mg | Freq: Three times a day (TID) | INTRAMUSCULAR | Status: DC

## 2019-09-05 MED ORDER — SODIUM CHLORIDE 0.9% FLUSH: 3.0000 mL | INTRAVENOUS | Status: DC | PRN

## 2019-09-05 MED ORDER — GLYCOPYRROLATE 0.2 MG/ML IJ SOLN: 0.2000 mg | INTRAMUSCULAR | Status: DC | PRN

## 2019-09-05 MED ORDER — MORPHINE BOLUS VIA INFUSION
2.0000 mg | INTRAVENOUS | Status: DC | PRN
  Filled 2019-09-05: qty 2

## 2019-09-05 MED ORDER — ONDANSETRON HCL 4 MG/2ML IJ SOLN: 4.0000 mg | Freq: Four times a day (QID) | INTRAMUSCULAR | Status: DC | PRN

## 2019-09-05 MED ORDER — LORAZEPAM 1 MG PO TABS: 1.0000 mg | ORAL_TABLET | ORAL | Status: DC | PRN

## 2019-09-05 MED ORDER — ONDANSETRON 4 MG PO TBDP
4.0000 mg | ORAL_TABLET | Freq: Four times a day (QID) | ORAL | Status: DC | PRN
  Filled 2019-09-05: qty 1

## 2019-09-05 MED ORDER — GLYCOPYRROLATE 0.2 MG/ML IJ SOLN: 0.4000 mg | Freq: Three times a day (TID) | INTRAMUSCULAR | Status: DC

## 2019-09-05 MED ORDER — HALOPERIDOL LACTATE 2 MG/ML PO CONC
0.5000 mg | ORAL | Status: DC | PRN
  Filled 2019-09-05: qty 0.3

## 2019-09-05 MED ORDER — POLYVINYL ALCOHOL 1.4 % OP SOLN
1.0000 [drp] | Freq: Four times a day (QID) | OPHTHALMIC | Status: DC | PRN
  Filled 2019-09-05: qty 15

## 2019-09-05 MED ORDER — LORAZEPAM 2 MG/ML IJ SOLN: 1.0000 mg | INTRAMUSCULAR | Status: DC | PRN

## 2019-09-05 MED ORDER — HALOPERIDOL 0.5 MG PO TABS
0.5000 mg | ORAL_TABLET | ORAL | Status: DC | PRN
  Filled 2019-09-05: qty 1

## 2019-09-06 LAB — URINE CULTURE: Culture: 30000 — AB

## 2019-09-13 ENCOUNTER — Encounter (HOSPITAL_COMMUNITY): Payer: Medicare HMO | Admitting: Cardiology

## 2019-09-14 ENCOUNTER — Ambulatory Visit: Payer: Medicare HMO | Admitting: Vascular Surgery

## 2019-09-27 ENCOUNTER — Encounter: Payer: Medicare HMO | Admitting: Internal Medicine

## 2019-10-03 NOTE — Death Summary Note (Signed)
DEATH SUMMARY   Patient Details  Name: Lance Cook MRN: [id number] DOB: 07-01-1961  Admission/Discharge Information   Admit Date:  09-04-2019  Date of Death: Date of Death: 09-22-19  Time of Death: Time of Death: 62  Length of Stay: 12/04/22  Referring Physician: [name], MD   Reason(s) for Hospitalization  Acute on chronic systolic heart failure   Diagnoses  Preliminary cause of death: Acute on chronic systolic heart failure    Secondary Diagnoses (including complications and co-morbidities):  Active Problems:   Acute respiratory failure (HCC)   Pneumonia due to infectious organism   Acute encephalopathy   Goals of care, counseling/discussion   Hyperkalemia   Central venous catheter in place   Cardiogenic shock (HCC)   Protein-calorie malnutrition, severe   Respiration abnormal   AKI (acute kidney injury) ([place])   Palliative care by specialist   Acute combined systolic and diastolic heart failure ([place])   Cardiorenal syndrome with renal failure   DNR (do not resuscitate)   Palliative care encounter   Brief Hospital Course (including significant findings, care, treatment, and services provided and events leading to death)  59 year old man, history of Bentall procedure, MVR, atrial fibrillation, CAD with chronic systolic and diastolic CHF.  Came to the ER with fatigue, weakness and hypotension.  He was found to be profoundly bradycardic with severe left bundle branch block and QRS widening on EKG.  Found to be severely hyperkalemic with new acute renal failure.  He is on digoxin at baseline and his digoxin level was 2.6.  He will be admitted emergently to the ICU for hemodialysis. He was given digibind for dig toxicity.   Patient was extubated on 12/23 and stated he does not want to be intubated again ever.  On the morning of 1/4 the patient was deteriorating from a respiratory and hemodynamic standpoint and continued to refuse intubation.  I spoke with the wife and  relayed to her the patient's wishes.  She agreed to comfort measures.  Morphine was started, the BiPAP was removed and patient expired shortly thereafter with the family bedside.    Pertinent Labs and Studies  Significant Diagnostic Studies DG Abd 1 View  Result Date: 08/29/2019 CLINICAL DATA:  59 y.o. yo male with h/o CHF (EF 30%), mechanical mitral and aortic valve A. fib, s/p VT arrest, ICD, DM, Bell's palsy, CVA came to the ER 09-04-19 with fatigue, weakness and hypotension; potassium of 8.4 associated with hypotens.*comment was truncated* EXAM: ABDOMEN - 1 VIEW COMPARISON:  Abdominal radiograph 08/28/2019 FINDINGS: NG tube with tip in stomach. No dilated large or small bowel. Gas in the rectum. Mild basilar atelectasis. Cardiomegaly. IMPRESSION: 1. No interval change. 2. No bowel obstruction. Electronically Signed   By: Suzy Bouchard M.D.   On: 08/29/2019 17:52   DG Abd 1 View  Result Date: 08/28/2019 CLINICAL DATA:  NG tube insertion. EXAM: ABDOMEN - 1 VIEW 4:39 p.m. COMPARISON:  08/28/2019 at 11:54 a.m. FINDINGS: The NG tube has been advanced and the tip is now in the fundus of the stomach. No dilated bowel. Haziness at both lung bases probably represent small effusions. Cardiomegaly. Mitral valve prosthesis. IMPRESSION: Benign-appearing abdomen. The tip of the nasogastric tube is in the fundus of the stomach. Haziness at both lung bases likely represent small effusions. Electronically Signed   By: Lorriane Shire M.D.   On: 08/28/2019 16:57   DG Abd 1 View  Result Date: 08/20/2019 CLINICAL DATA:  Enteric tube placement. EXAM: ABDOMEN - 1 VIEW  COMPARISON:  None. FINDINGS: Enteric tube tip near the pylorus and possibly within the first portion of the duodenum. The bowel gas pattern is normal. Small amount of oral contrast in the gastric fundus. No radio-opaque calculi or other significant radiographic abnormality are seen. Prior cholecystectomy. No acute osseous abnormality. IMPRESSION: 1.  Enteric tube tip near the pylorus/first portion of the duodenum. Electronically Signed   By: Titus Dubin M.D.   On: 08/20/2019 12:20   DG CHEST PORT 1 VIEW  Result Date: 09/04/2019 CLINICAL DATA:  Pneumonia EXAM: PORTABLE CHEST 1 VIEW COMPARISON:  Chest radiograph 09/02/2019 FINDINGS: Stable cardiomediastinal contours. Unchanged support apparatus. Low lung volumes. Improved aeration in the bilateral lung bases. No pneumothorax or significant pleural effusion. IMPRESSION: Improved aeration in the bilateral lung bases. Electronically Signed   By: Audie Pinto M.D.   On: 09/04/2019 07:42   DG CHEST PORT 1 VIEW  Result Date: 09/02/2019 CLINICAL DATA:  Reason for exam: PNA Hx of ascending aortic dissection, cad, chf, diabetes. Hx of mitral valve replacement, CABG, BENTALL procedure. EXAM: PORTABLE CHEST 1 VIEW COMPARISON:  Chest radiograph 08/31/2019 FINDINGS: Stable cardiomediastinal contours status post median sternotomy and valve replacement. Unchanged support apparatus. Low lung volumes with increasing consolidation at the right lung base. Persistent left retrocardiac opacity. Probable bilateral pleural effusions. No pneumothorax. IMPRESSION: 1. Low lung volumes with increasing right lower lobe consolidation. 2. Persistent left retrocardiac opacity. 3. Probable small bilateral pleural effusions. Electronically Signed   By: Audie Pinto M.D.   On: 09/02/2019 11:37   DG Chest Portable 1 View  Result Date: 08/31/2019 CLINICAL DATA:  Central line placement EXAM: PORTABLE CHEST 1 VIEW COMPARISON:  08/31/2019 at 7:33 a.m. FINDINGS: A nasogastric tube enters the stomach. Pacer device noted. Prosthetic aortic valve. A left internal jugular central line is visualized. This terminates in the lower SVC. No pneumothorax or complicating feature. Low lung volumes. Indistinct airspace opacity in the right mid lung and right lung base, as well as the left retrocardiac region. The right lateral ribs are  inadvertently excluded. Thoracic spondylosis. IMPRESSION: 1. Left IJ line tip: SVC.  No pneumothorax. 2. Bibasilar and right mid lung airspace opacities potentially from atelectasis or pneumonia. 3. Otherwise stable. Electronically Signed   By: Van Clines M.D.   On: 08/31/2019 19:32   DG CHEST PORT 1 VIEW  Result Date: 08/31/2019 CLINICAL DATA:  Tachypnea EXAM: PORTABLE CHEST 1 VIEW COMPARISON:  Three days ago FINDINGS: Enteric tube with tip over the stomach. Biventricular pacer from the left with leads in unremarkable position. The left IJ line has been removed. Prior median sternotomy with mitral valve replacement low volume chest with asymmetric haziness on the right. No generalized Kerley lines or pneumothorax. IMPRESSION: 1. Mild improvement in aeration. 2. Asymmetric airspace opacity greater on the right. This could be pneumonia in the setting of rising white count. Electronically Signed   By: Monte Fantasia M.D.   On: 08/31/2019 08:01   AM DG Chest Port 1 View  Result Date: 08/28/2019 CLINICAL DATA:  58 year old male s/p cardiogenic shock. Abnormal respiration. Negative for COVID-19. EXAM: PORTABLE CHEST 1 VIEW COMPARISON:  08/20/2019 and earlier. FINDINGS: Portable AP upright view at 0537 hours. Extubated. An enteric tube is in place, with side hole at the level of the stomach. Stable lung volumes compared to 08/20/2019. Stable cardiomegaly and mediastinal contours. Prosthetic cardiac valve and left chest AICD again noted. Patchy and nodular right lung opacity has mildly regressed, with a more reticulonodular appearance now. Left  lung perihilar ventilation also appears improved. No pneumothorax. No pleural effusion is evident. Negative visible bowel gas pattern. IMPRESSION: 1. Extubated. 2. Enteric tube in place, side hole at the level of the stomach. 3. Improved ventilation since 08/20/2019. Residual reticulonodular opacity in the right lung might be asymmetric edema or infection.  Electronically Signed   By: Genevie Ann M.D.   On: 08/28/2019 07:26   DG CHEST PORT 1 VIEW  Result Date: 08/20/2019 CLINICAL DATA:  Intubation EXAM: PORTABLE CHEST 1 VIEW COMPARISON:  08/30/2019 FINDINGS: The endotracheal tube terminates above the carina by approximately 2.7 cm. There is a left-sided central venous catheter that terminates over the SVC. There is a left-sided pacemaker/ICD in place. There is a line coursing along the patient's medial right chest which is favored to be external to the patient sign. There appear to be a few pockets of gas in the low right neck. There are worsening pulmonary opacities bilaterally. The heart size is enlarged. Patient is status post prior median sternotomy. The lung volumes are low. IMPRESSION: 1. Lines and tubes as above. 2. There is a line coursing over the patient's medial right chest wall which is favored to be external to the patient. 3. Pockets of subcutaneous gas in the low right neck, likely related to recent surgical intervention for removal of the patient's malpositioned right central venous catheter. 4. Worsening pulmonary opacities bilaterally which may represent developing pulmonary edema or an infectious process. 5. Cardiomegaly. Low lung volumes with probable small bilateral pleural effusions. 6. No pneumothorax. Electronically Signed   By: Constance Holster M.D.   On: 08/20/2019 01:03   DG Chest Portable 1 View  Result Date: 08/03/2019 CLINICAL DATA:  Central line placement EXAM: PORTABLE CHEST 1 VIEW COMPARISON:  Earlier same day FINDINGS: Left internal jugular central line has its tip in the SVC above the right atrium. No pneumothorax. Right internal jugular central line remains in place, crossing the midline towards the left. As noted previously, arterial placement not excluded. Pacemaker/AICD remains in place. Chronic cardiomegaly with mitral valve replacement. Persistent bibasilar atelectasis. IMPRESSION: New left internal jugular central line  tip in the SVC above the right atrium. No pneumothorax. No other change. Electronically Signed   By: Nelson Chimes M.D.   On: 08/10/2019 17:21   DG Chest Port 1 View  Result Date: 08/03/2019 CLINICAL DATA:  Central line placement. EXAM: PORTABLE CHEST 1 VIEW COMPARISON:  08/02/2019 at 2:33 p.m. FINDINGS: Right internal jugular central venous line has placed. Tip extends across the midline to the left. This could be arterial in location. Lung appearance is stable from prior exam. There is no pneumothorax. IMPRESSION: 1. New right internal jugular central venous line tip crosses the right neck base to the left upper mediastinum. This is concerning for placement within the innominate artery. It does not track towards the SVC. Critical Value/emergent results were called by telephone at the time of interpretation on 08/09/2019 at 4:25 pm to emergency room physician, who verbally acknowledged these results. 2. No pneumothorax.  No other change from the earlier study. Electronically Signed   By: Lajean Manes M.D.   On: 08/23/2019 16:25   DG Chest Portable 1 View  Result Date: 08/14/2019 CLINICAL DATA:  Bradycardia. Hypotension. EXAM: PORTABLE CHEST 1 VIEW COMPARISON:  06/29/2019 FINDINGS: There is chronic cardiomegaly. Prosthetic mitral valve. AICD in place. Chronic prominence of the main pulmonary artery. Slight atelectasis at the right lung base. The small right effusion seen on the prior study is not  apparent on today's exam. Left lung is clear. No acute bone abnormality. IMPRESSION: 1. Chronic cardiomegaly. 2. Slight atelectasis at the right lung base. 3. The small right effusion seen on the prior study is not apparent on today's exam. Electronically Signed   By: Lorriane Shire M.D.   On: 08/22/2019 14:50   DG Abd Portable 1V  Result Date: 08/28/2019 CLINICAL DATA:  NG tube placement EXAM: PORTABLE ABDOMEN - 1 VIEW COMPARISON:  08/24/2019 FINDINGS: 1154 hours. Portable upright view of the lower chest and  upper abdomen shows the tip of an NG tube at the GE junction with proximal side port in the distal esophagus. Cardiopericardial silhouette is enlarged. There is bibasilar airspace disease better characterized on chest x-ray earlier today. Bowel gas pattern is nonspecific. IMPRESSION: NG tube tip is in the distal esophagus at the esophagogastric junction. Electronically Signed   By: Misty Stanley M.D.   On: 08/28/2019 12:12   DG Abd Portable 1V  Result Date: 08/24/2019 CLINICAL DATA:  Encounter for gastric tube placement. EXAM: PORTABLE ABDOMEN - 1 VIEW COMPARISON:  08/20/2019. FINDINGS: Surgical clips right upper quadrant. Surgical clips right groin. NG tube noted, its tip is kinked in the stomach. No gastric or bowel distention. No free air. Degenerative change lumbar spine and both hips. IMPRESSION: NG tube, its tip is kinked in the stomach. No gastric or bowel distention. Electronically Signed   By: Marcello Moores  Register   On: 08/24/2019 15:27    Microbiology Recent Results (from the past 240 hour(s))  MRSA PCR Screening     Status: None   Collection Time: 08/31/19  8:33 AM   Specimen: Urine, Clean Catch; Nasopharyngeal  Result Value Ref Range Status   MRSA by PCR NEGATIVE NEGATIVE Final    Comment:        The GeneXpert MRSA Assay (FDA approved for NASAL specimens only), is one component of a comprehensive MRSA colonization surveillance program. It is not intended to diagnose MRSA infection nor to guide or monitor treatment for MRSA infections. Performed at [facility], [place] [address] [place], [place] [address]   Culture, blood (routine x 2)     Status: None   Collection Time: 08/31/19 10:28 AM   Specimen: BLOOD LEFT HAND  Result Value Ref Range Status   Specimen Description BLOOD LEFT HAND  Final   Special Requests   Final    BOTTLES DRAWN AEROBIC AND ANAEROBIC Blood Culture adequate volume   Culture   Final    NO GROWTH 5 DAYS Performed at [facility], [place]  [address], [place], [place] [address]    Report Status 05-Oct-2019 FINAL  Final  Culture, blood (routine x 2)     Status: None   Collection Time: 08/31/19 10:30 AM   Specimen: BLOOD RIGHT HAND  Result Value Ref Range Status   Specimen Description BLOOD RIGHT HAND  Final   Special Requests   Final    BOTTLES DRAWN AEROBIC AND ANAEROBIC Blood Culture results may not be optimal due to an inadequate volume of blood received in culture bottles   Culture   Final    NO GROWTH 5 DAYS Performed at [facility], [place] [address], [place], [place] [address]    Report Status Oct 05, 2019 FINAL  Final  Culture, respiratory (non-expectorated)     Status: None   Collection Time: 09/02/19  9:03 AM   Specimen: Tracheal Aspirate; Respiratory  Result Value Ref Range Status   Specimen Description TRACHEAL  ASPIRATE  Final   Special Requests NONE  Final   Gram Stain   Final    MODERATE WBC PRESENT, PREDOMINANTLY PMN ABUNDANT GRAM NEGATIVE RODS MODERATE GRAM POSITIVE COCCI IN PAIRS IN CLUSTERS Performed at [facility], [place] [address] [place], [place] [address]    Culture ABUNDANT STAPHYLOCOCCUS EPIDERMIDIS  Final   Report Status 09/04/2019 FINAL  Final   Organism ID, Bacteria STAPHYLOCOCCUS EPIDERMIDIS  Final      Susceptibility   Staphylococcus epidermidis - MIC*    CIPROFLOXACIN >=8 RESISTANT Resistant     ERYTHROMYCIN <=0.25 SENSITIVE Sensitive     GENTAMICIN 4 SENSITIVE Sensitive     OXACILLIN >=4 RESISTANT Resistant     TETRACYCLINE 2 SENSITIVE Sensitive     VANCOMYCIN 1 SENSITIVE Sensitive     TRIMETH/SULFA 80 RESISTANT Resistant     CLINDAMYCIN <=0.25 SENSITIVE Sensitive     RIFAMPIN <=0.5 SENSITIVE Sensitive     Inducible Clindamycin NEGATIVE Sensitive     * ABUNDANT STAPHYLOCOCCUS EPIDERMIDIS  Culture, Urine     Status: Abnormal   Collection Time: 09/03/19  8:45 PM   Specimen: Urine, Random  Result Value Ref Range Status   Specimen Description URINE, RANDOM  Final   Special  Requests   Final    NONE Performed at [facility], [place] [address], [place], [place] [address]    Culture 30,000 COLONIES/mL PSEUDOMONAS AERUGINOSA (A)  Final   Report Status 09/06/2019 FINAL  Final   Organism ID, Bacteria PSEUDOMONAS AERUGINOSA (A)  Final      Susceptibility   Pseudomonas aeruginosa - MIC*    CEFTAZIDIME 4 SENSITIVE Sensitive     CIPROFLOXACIN <=0.25 SENSITIVE Sensitive     GENTAMICIN <=1 SENSITIVE Sensitive     IMIPENEM 2 SENSITIVE Sensitive     PIP/TAZO 8 SENSITIVE Sensitive     CEFEPIME 2 SENSITIVE Sensitive     * 30,000 COLONIES/mL PSEUDOMONAS AERUGINOSA    Lab Basic Metabolic Panel: Recent Labs  Lab 09/01/19 1700 09/02/19 0500 09/03/19 0453 09/03/19 1956 09/04/19 0356 09/04/19 1140 29-Sep-2019 0449  NA 159* 157* 149* 145 145 145 149*  K 3.1* 4.9 4.9 4.8 4.6 4.0 3.8  CL 112* 112* 105 104 104 101 103  CO2 38* 36* 31 29 27 28 29   GLUCOSE 110* 113* 217* 146* 142* 172* 170*  BUN 125* 134* 149* 159* 164* 169* 181*  CREATININE 2.58* 2.74* 3.07* 3.31* 3.43* 3.51* 3.73*  CALCIUM 8.5* 8.2* 7.7* 7.5* 7.7* 8.0* 8.1*  MG 2.3 2.2 2.2  --  2.3  --  2.3  PHOS  --  3.1 5.7*  --  5.4*  --  6.6*   Liver Function Tests: Recent Labs  Lab 09/02/19 0500 09/03/19 0453 09/04/19 0356 29-Sep-2019 0449  ALBUMIN 2.0* 1.9* 1.9* 1.9*   No results for input(s): LIPASE, AMYLASE in the last 168 hours. No results for input(s): AMMONIA in the last 168 hours. CBC: Recent Labs  Lab 09/02/19 0500 09/03/19 0453 09/04/19 0356 September 29, 2019 0449  WBC 23.8* 31.5* 27.6* 23.8*  HGB 8.2* 8.7* 8.5* 8.7*  HCT 31.2* 33.5* 31.4* 31.8*  MCV 84.1 84.8 83.1 82.2  PLT 215 249 223 204   Cardiac Enzymes: No results for input(s): CKTOTAL, CKMB, CKMBINDEX, TROPONINI in the last 168 hours. Sepsis Labs: Recent Labs  Lab 09/02/19 0500 09/03/19 0453 09/04/19 0356 2019-09-29 0449  WBC 23.8* 31.5* 27.6* 23.8*    Procedures/Operations     Jennet Maduro 09/08/2019, 2:58 PM

## 2019-10-03 NOTE — Progress Notes (Signed)
[facility] Progress Note    Assessment/ Plan:   Pt is a 59 y.o. yo male CHF, mechanical mitral and aortic valve A. fib, s/p VT arrest, DM, potassium of 8.4 associated with hypotension and AKI on admission.  Unfortunately despite aggressive medical interventions he has continued to decline and per RN at bedside this morning PCCM discussed with wife this morning and plans being made to transition to comfort measures only with morphine gtt when family arrives.    Please don't hesitate to contact me should I be able to assist with anything but I will not continue to follow.    Subjective:    Febrile yesterday with CXR with RLL PNA. On Bipap, DNI.  Wife changed to pursue other full measures yesterday; ongoing [place] discussions.     Objective:   BP 114/76 (BP Location: Left Arm)   Pulse (!) 124   Temp 99 F (37.2 C) (Axillary)   Resp (!) 30   Ht 5' 2.21" (1.58 m)   Wt 71.8 kg   SpO2 99%   BMI 28.76 kg/m   Intake/Output Summary (Last 24 hours) at Oct 01, 2019 0849 Last data filed at 2019-10-01 0800 Gross per 24 hour  Intake 1309.13 ml  Output 935 ml  Net 374.13 ml   Weight change: 3.6 kg  Physical Exam: Gen: cachectic, severe temporal wasting HEENT: in bed on BiPaP CVS: tachycardic, + LV heave Resp: tachypneic with rhonchi bilaterally Abd: nontender Ext: 1+ RUE edema, anasarca of lower extremities  Imaging: DG CHEST PORT 1 VIEW  Result Date: 09/04/2019 CLINICAL DATA:  Pneumonia EXAM: PORTABLE CHEST 1 VIEW COMPARISON:  Chest radiograph 09/02/2019 FINDINGS: Stable cardiomediastinal contours. Unchanged support apparatus. Low lung volumes. Improved aeration in the bilateral lung bases. No pneumothorax or significant pleural effusion. IMPRESSION: Improved aeration in the bilateral lung bases. Electronically Signed   By: Audie Pinto M.D.   On: 09/04/2019 07:42    Labs: BMET Recent Labs  Lab 08/30/19 0345 08/31/19 0447 09/01/19 0513 09/01/19 1700  09/02/19 0500 09/03/19 0453 09/03/19 1956 09/04/19 0356 09/04/19 1140 2019-10-01 0449  NA 151* 151* 158* 159* 157* 149* 145 145 145 149*  K 3.7 3.9 2.5* 3.1* 4.9 4.9 4.8 4.6 4.0 3.8  CL 108 109 109 112* 112* 105 104 104 101 103  CO2 31 31 38* 38* 36* 31 29 27 28 29   GLUCOSE 117* 113* 111* 110* 113* 217* 146* 142* 172* 170*  BUN 112* 118* 120* 125* 134* 149* 159* 164* 169* 181*  CREATININE 2.39* 2.55* 2.49* 2.58* 2.74* 3.07* 3.31* 3.43* 3.51* 3.73*  CALCIUM 9.4 9.2 8.8* 8.5* 8.2* 7.7* 7.5* 7.7* 8.0* 8.1*  PHOS 4.2 5.5* 5.3*  --  3.1 5.7*  --  5.4*  --  6.6*   CBC Recent Labs  Lab 09/02/19 0500 09/03/19 0453 09/04/19 0356 2019-10-01 0449  WBC 23.8* 31.5* 27.6* 23.8*  HGB 8.2* 8.7* 8.5* 8.7*  HCT 31.2* 33.5* 31.4* 31.8*  MCV 84.1 84.8 83.1 82.2  PLT 215 249 223 204    Medications:    . sodium chloride   Intravenous Once  . sodium chloride   Intravenous Once  . albuterol  2.5 mg Nebulization TID  . chlorhexidine  15 mL Mouth Rinse BID  . Chlorhexidine Gluconate Cloth  6 each Topical Q0600  . darbepoetin (ARANESP) injection - NON-DIALYSIS  100 mcg Subcutaneous Q Wed-1800  . feeding supplement (PRO-STAT SUGAR FREE 64)  30 mL Per Tube BID  . hydrocortisone sod succinate (SOLU-CORTEF) inj  50 mg Intravenous Q6H  . hydroxypropyl methylcellulose / hypromellose  1 drop Both Eyes TID  . insulin aspart  2-6 Units Subcutaneous Q6H  . mouth rinse  15 mL Mouth Rinse q12n4p  . pantoprazole sodium  40 mg Per Tube Daily  . Warfarin - Pharmacist Dosing Inpatient   Does not apply [place], MD [facility] pgr [phone] 2019/09/15, 8:49 AM

## 2019-10-03 NOTE — Progress Notes (Deleted)
RN noted patient's right arm swollen where the two PIV's are located. Levophed and sedations are infusing. Infiltration of levophed is suspected. E-link MD notified and orders given. Ground team is at bedside.

## 2019-10-03 NOTE — Progress Notes (Addendum)
Patient clearly expressed that he does not want intubation.   He expressed it to Dr. Loanne Drilling and on a separate occasion he expressed it to me.    It would be a violation of Mr. Boas rights to intubate him again.    He is a partial code.   Do not intubate.  I will have further discussion with Mrs. Ohlin regarding goals of care.  Florentina Jenny, PA-C Palliative Medicine Office:  [phone]  No charge.

## 2019-10-03 NOTE — Progress Notes (Signed)
NAME:  Lance Cook, MRN:  [id number], DOB:  01-31-1961, LOS: 20 ADMISSION DATE:  08/08/2019, CONSULTATION DATE:  08/05/2019 REFERRING MD:  Dr Sherry Ruffing, CHIEF COMPLAINT:  Hyperkalemia    Brief History   59 yo male with PMH significant for Bentall procedure, MVR, Afib, CAD, systolic and diastolic heart failure admitted with bradycardia and found to have severe life-threatening hyperkalemia with AKI and dig toxicity, PCCM consulted for admission and need for urgent iHD.  Hospital course included mechanical ventilation 12/18-12/23.    History of present illness   59 year old man, history of Bentall procedure, MVR, atrial fibrillation, CAD with chronic systolic and diastolic CHF.  Came to the ER with fatigue, weakness and hypotension.  He was found to be profoundly bradycardic with severe left bundle branch block and QRS widening on EKG.  Found to be severely hyperkalemic with new acute renal failure.  He is on digoxin at baseline and his digoxin level was 2.6.  He will be admitted emergently to the ICU for hemodialysis. He was given digibind for dig toxicity.   Significant Hospital Events   12/17 Admitted, urgent HD 12/18 had HD catheter in carotid removed in OR. Returned intubated 12/23 Extubated  Consults:  Nephrology Heart Failure  Procedures:  R IJ HD catheter 12/17 >> Misplaced in carotid, removed in OR 12/18 L IJ HD catheter 12/17 >>  ETI 12/18>>12/23  Significant Diagnostic Tests:  NM cardiac amyloid scan 10/21 >> equivocal for any evidence amyloidosis  ECHO 12/18 > LVEF 30 to 35%. No left ventricular hypertrophy. RV has normal systolic function. The mitral valve has been repaired/replaced. Mild mitral valve regurgitation. Moderate mitral stenosis. Tricuspid valve regurgitation mild-moderate. 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  S/P Bentall.  Moderately elevated pulmonary artery systolic pressure. . A pacer wire is visualized in the RV and RA. Right atrial pressure of  15 mmHg. History of mechanical aortic and mitral valves. Leaflets not well visualized, could consider fluroscopy of leaflets, but gradients not suggestive of obstruction.  CXR 12/17 > New left internal jugular central line tip in the SVC above the right atrium. No pneumothorax. No other change.  Micro Data:  COVID 12/17 > negative  Blood cultures 12/17 >NGTD   MRSA PCR 12/17 >negative  Urine culture 12/18 >  Negative  Antimicrobials:  Cefazolin 12/18 SCIP  Vanco 12/30 >> Cefepime 12/30 >>  Interim history/subjective:  Patient struggling this AM with breathing  Objective   Blood pressure 114/76, pulse (!) 124, temperature 99 F (37.2 C), temperature source Axillary, resp. rate (!) 30, height 5' 2.21" (1.58 m), weight 71.8 kg, SpO2 100 %. CVP:  [6 mmHg-12 mmHg] 12 mmHg  FiO2 (%):  [40 %] 40 %   Intake/Output Summary (Last 24 hours) at Sep 15, 2019 0841 Last data filed at 2019-09-15 0800 Gross per 24 hour  Intake 1309.13 ml  Output 935 ml  Net 374.13 ml   Filed Weights   09/03/19 0409 09/04/19 0458 September 15, 2019 0500  Weight: 67.8 kg 68.2 kg 71.8 kg   Examination: General: Acutely ill appearing, acute respiratory distress Neck: right sided scar without erythema HEENT: [place]/AT, PERRL, EOM-I and BiPAP on, struggling  Neuro: fatigued, sleeping, arousable to stimulation but not awake and talking,  CV:  [id number], mechanical clicks, tachycardic, regular rhythm PULM: tachypneic, weak wet-sounding cough with thick secretions when NT suctioned GI: soft, NT, ND Extremities: dependent edema, no cyanosis Skin: stasis dermatatitis changes b/l shins, no rashes  Bedside US: very small simple appearing dependent bilateral pleural effusions <1cm  Resolved Hospital Problem list   Coagulopathy with elevated INR Hyperkalemia Digoxin Toxicity  Assessment & Plan:   Acute hypoxemic respiratory failure, improved with diuresis. CXR with likely b/l dependent effusions, RLL pneumonia.  On BiPAP overnight  but clearly very distraught by it Does not wish for intubation Would prefer to be comfortable  Had an extensive conversation with the wife over the phone.  She feels that everybody has been too pessimistic and that morphine is unreasonable.  After a long discussion, decision was made to d/c BiPAP as it is bother the patient an making him suffer and start low dose morphine drip.  Once wife arrives then will stop pressors and titrate morphine up for comfort and is a full DNR.  Best practice:  Diet: tube feeds per recs: tolerating Pain/Anxiety/Delirium protocol (if indicated): na VAP protocol (if indicated): na DVT prophylaxis: warfarin, pharmacy to dose. Heparin drip d/ced  GI prophylaxis: PPI Glucose control: SSI Mobility: PT/OT Code Status: DNI, otherwise Ok for Chest compressions.  Family Engagement: discussed with wife at bedside. Disposition: ICU  Labs   CBC: Recent Labs  Lab 09/01/19 0513 09/02/19 0500 09/03/19 0453 09/04/19 0356 04-Oct-2019 0449  WBC 21.9* 23.8* 31.5* 27.6* 23.8*  HGB 8.6* 8.2* 8.7* 8.5* 8.7*  HCT 31.3* 31.2* 33.5* 31.4* 31.8*  MCV 80.7 84.1 84.8 83.1 82.2  PLT 218 215 249 223 0000000    Basic Metabolic Panel: Recent Labs  Lab 09/01/19 0513 09/01/19 1700 09/02/19 0500 09/03/19 0453 09/03/19 1956 09/04/19 0356 09/04/19 1140 October 04, 2019 0449  NA 158* 159* 157* 149* 145 145 145 149*  K 2.5* 3.1* 4.9 4.9 4.8 4.6 4.0 3.8  CL 109 112* 112* 105 104 104 101 103  CO2 38* 38* 36* 31 29 27 28 29   GLUCOSE 111* 110* 113* 217* 146* 142* 172* 170*  BUN 120* 125* 134* 149* 159* 164* 169* 181*  CREATININE 2.49* 2.58* 2.74* 3.07* 3.31* 3.43* 3.51* 3.73*  CALCIUM 8.8* 8.5* 8.2* 7.7* 7.5* 7.7* 8.0* 8.1*  MG 2.4 2.3 2.2 2.2  --  2.3  --  2.3  PHOS 5.3*  --  3.1 5.7*  --  5.4*  --  6.6*   GFR: Estimated Creatinine Clearance: 18.9 mL/min (A) (by C-G formula based on SCr of 3.73 mg/dL (H)). Recent Labs  Lab 08/31/19 0447 09/02/19 0500 09/03/19 0453 09/04/19 0356  Oct 04, 2019 0449  PROCALCITON 0.40  --   --   --   --   WBC 22.0* 23.8* 31.5* 27.6* 23.8*    Liver Function Tests: Recent Labs  Lab 09/01/19 0513 09/02/19 0500 09/03/19 0453 09/04/19 0356 October 04, 2019 0449  ALBUMIN 2.1* 2.0* 1.9* 1.9* 1.9*    ABG    Component Value Date/Time   PHART 7.435 08/31/2019 0416   PCO2ART 52.0 (H) 08/31/2019 0416   PO2ART 95.0 08/31/2019 0416   HCO3 35.1 (H) 08/31/2019 0416   TCO2 37 (H) 08/31/2019 0416   ACIDBASEDEF 1.0 01/26/2015 0425   O2SAT 49.0 10/04/19 0449     Coagulation Profile: Recent Labs  Lab 09/01/19 0513 09/02/19 0500 09/03/19 0453 09/04/19 0356 October 04, 2019 0449  INR 1.7* 2.3* 2.6* 2.9* 4.6*    CBG: Recent Labs  Lab 09/04/19 0629 09/04/19 1142 09/04/19 1803 2019/10/04 0018 October 04, 2019 0633  GLUCAP 143* 159* 117* 147* 159*   The patient is critically ill with multiple organ systems failure and requires high complexity decision making for assessment and support, frequent evaluation and titration of therapies, application of advanced monitoring technologies and extensive interpretation of multiple  databases.   Critical Care Time devoted to patient care services described in this note is  34  Minutes. This time reflects time of care of this signee Dr Jennet Maduro. This critical care time does not reflect procedure time, or teaching time or supervisory time of PA/NP/Med student/Med Resident etc but could involve care discussion time.  Rush Farmer, M.D. [facility] Pulmonary/Critical Care Medicine.

## 2019-10-03 NOTE — Progress Notes (Signed)
Patient resting comfortably in bed, patients wife, [name], at bedside along with her 2 sisters and her best friend. Morphine gtt infusing per CCM and palliate care orders. Patient asystolic on the monitor in two leads and patient is not breathing, patient does not have a pulse. Loma Newton, RN at bedside with this RN to pronounce.  Heart and lund fields auscultated for 2 minutes. Time of death 14:40. Dr Nelda Marseille made aware. Will continue to provide emotional support to wife and family.

## 2019-10-03 NOTE — Progress Notes (Signed)
Patient ID: Lance Cook, male   DOB: Feb 26, 1961, 59 y.o.   MRN: [id number]    Advanced Heart Failure Rounding Note   Subjective:    CVVHD stopped 12/24. Transitioned back to IV lasix after CVVHD discontinuation but developed worsening renal function and diuretics held which lead to progressive volume overload and hypoxic respiratory failure.  Now with fever to 103 overnight and suspected RLL PNA on CXR (on vancomycin/cefepime with MRSE in sputum and Pseudomonas in urine).  Remains on Bipap this morning. Pt has refused intubation. Partial CODE. UOP 960 cc with co-ox 49% and CVP 10 on norepinephrine 12.  HR around 120 on amiodarone gtt 60, probably atrial flutter.   BUN/creatinine continue to rise. Decision yesterday for DNR, no CVVH but overnight wife called back and made him partial code again (DNI only).   He does not respond to me this morning.   Sodium 149 with increased free water.     INR 4.6, now off heparin gtt.   Objective:   Weight Range:  Vital Signs:   Temp:  [100.1 F (37.8 C)-103.1 F (39.5 C)] 100.1 F (37.8 C) (01/04 0418) Pulse Rate:  [123-139] 123 (01/04 0600) Resp:  [26-45] 26 (01/04 0600) BP: (81-126)/(35-86) 107/77 (01/04 0600) SpO2:  [97 %-100 %] 100 % (01/04 0600) FiO2 (%):  [40 %] 40 % (01/03 1936) Weight:  [71.8 kg] 71.8 kg (01/04 0500) Last BM Date: 09/04/19  Weight change: Filed Weights   09/03/19 0409 09/04/19 0458 2019-09-27 0500  Weight: 67.8 kg 68.2 kg 71.8 kg    Intake/Output:   Intake/Output Summary (Last 24 hours) at September 27, 2019 0801 Last data filed at 09/27/2019 0600 Gross per 24 hour  Intake 1214.76 ml  Output 760 ml  Net 454.76 ml     Physical Exam: CVP 10 General: NAD, frail and on Bipap.  Neck: JVP 8-9 cm, no thyromegaly or thyroid nodule.  Lungs: Decreased at bases.  CV: Nondisplaced PMI.  Heart tachy, irregular with mechanical S1/S2, no S3/S4, 2/6 SEM RUSB.  1+ edema to knees.   Abdomen: Soft, nontender, no hepatosplenomegaly, no  distention.  Skin: Intact without lesions or rashes.  Neurologic: Does not respond to me  Extremities: No clubbing or cyanosis.  HEENT: Normal.    Telemetry: ?atrial flutter 120 Personally reviewed   Labs: Basic Metabolic Panel: Recent Labs  Lab 09/01/19 0513 09/01/19 1700 09/02/19 0500 09/03/19 0453 09/03/19 1956 09/04/19 0356 09/04/19 1140 September 27, 2019 0449  NA 158* 159* 157* 149* 145 145 145 149*  K 2.5* 3.1* 4.9 4.9 4.8 4.6 4.0 3.8  CL 109 112* 112* 105 104 104 101 103  CO2 38* 38* 36* 31 29 27 28 29   GLUCOSE 111* 110* 113* 217* 146* 142* 172* 170*  BUN 120* 125* 134* 149* 159* 164* 169* 181*  CREATININE 2.49* 2.58* 2.74* 3.07* 3.31* 3.43* 3.51* 3.73*  CALCIUM 8.8* 8.5* 8.2* 7.7* 7.5* 7.7* 8.0* 8.1*  MG 2.4 2.3 2.2 2.2  --  2.3  --  2.3  PHOS 5.3*  --  3.1 5.7*  --  5.4*  --  6.6*    Liver Function Tests: Recent Labs  Lab 09/01/19 0513 09/02/19 0500 09/03/19 0453 09/04/19 0356 2019-09-27 0449  ALBUMIN 2.1* 2.0* 1.9* 1.9* 1.9*   No results for input(s): LIPASE, AMYLASE in the last 168 hours. No results for input(s): AMMONIA in the last 168 hours.  CBC: Recent Labs  Lab 09/01/19 0513 09/02/19 0500 09/03/19 0453 09/04/19 0356 Sep 27, 2019 0449  WBC 21.9* 23.8* 31.5*  27.6* 23.8*  HGB 8.6* 8.2* 8.7* 8.5* 8.7*  HCT 31.3* 31.2* 33.5* 31.4* 31.8*  MCV 80.7 84.1 84.8 83.1 82.2  PLT 218 215 249 223 204    Cardiac Enzymes: No results for input(s): CKTOTAL, CKMB, CKMBINDEX, TROPONINI in the last 168 hours.  BNP: BNP (last 3 results) Recent Labs    07/19/19 0956 08/02/19 0957 08/07/2019 1424  BNP 298.7* 167.3* 106.1*    ProBNP (last 3 results) No results for input(s): PROBNP in the last 8760 hours.    Other results:  Imaging: DG CHEST PORT 1 VIEW  Result Date: 09/04/2019 CLINICAL DATA:  Pneumonia EXAM: PORTABLE CHEST 1 VIEW COMPARISON:  Chest radiograph 09/02/2019 FINDINGS: Stable cardiomediastinal contours. Unchanged support apparatus. Low lung volumes.  Improved aeration in the bilateral lung bases. No pneumothorax or significant pleural effusion. IMPRESSION: Improved aeration in the bilateral lung bases. Electronically Signed   By: Audie Pinto M.D.   On: 09/04/2019 07:42     Medications:     Scheduled Medications: . sodium chloride   Intravenous Once  . sodium chloride   Intravenous Once  . albuterol  2.5 mg Nebulization TID  . chlorhexidine  15 mL Mouth Rinse BID  . Chlorhexidine Gluconate Cloth  6 each Topical Q0600  . darbepoetin (ARANESP) injection - NON-DIALYSIS  100 mcg Subcutaneous Q Wed-1800  . feeding supplement (PRO-STAT SUGAR FREE 64)  30 mL Per Tube BID  . furosemide  60 mg Intravenous Q8H  . hydrocortisone sod succinate (SOLU-CORTEF) inj  50 mg Intravenous Q6H  . hydroxypropyl methylcellulose / hypromellose  1 drop Both Eyes TID  . insulin aspart  2-6 Units Subcutaneous Q6H  . mouth rinse  15 mL Mouth Rinse q12n4p  . pantoprazole sodium  40 mg Per Tube Daily  . Warfarin - Pharmacist Dosing Inpatient   Does not apply q1800    Infusions: .  prismasol BGK 4/2.5    .  prismasol BGK 4/2.5    . sodium chloride Stopped (09/01/19 0922)  . amiodarone 60 mg/hr (Sep 21, 2019 0600)  . ceFEPime (MAXIPIME) IV Stopped (09/04/19 1328)  . chlorothiazide (DIURIL) IV Stopped (09/04/19 2157)  . feeding supplement (JEVITY 1.5 CAL/FIBER) Stopped (09/04/19 0031)  . norepinephrine (LEVOPHED) Adult infusion 15 mcg/min (2019-09-21 0600)  . prismasol BGK 4/2.5    . vancomycin Stopped (09/03/19 2319)    PRN Medications: sodium chloride, acetaminophen, albuterol, bisacodyl, fentaNYL (SUBLIMAZE) injection, guaiFENesin, heparin, lip balm, ondansetron (ZOFRAN) IV, polyethylene glycol, sennosides, sodium chloride   Assessment/Plan:   1. AKI on CKD stage 3: Baseline creatinine 1.4-1.6 Creatinine up to 2.7 at admission with marked hyperkalemia and hyponatremia in setting of use of torsemide, metolazone, and spironolactone at home.  He had  urgent HD with improvement.  CVVHD stopped 12/24. Creatinine has been steadily worsening since then.  Today, BUN 181 with creatinine 3.73.  He has responded poorly to IV diuretics.  - I do not think that he will tolerate CVVH well and I do not think there is a good end point, he was initially made DNR with focus on comfort yesterday but his wife called back last night and now wants full scope of care again, and patient is not awake enough for decision-making.  If we are to proceed with aggressive care, he will need CVVH. 2. Acute on chronic systolic CHF/cardiogenic shock: EF 30-35% on last echo.  [employer] CRT-D, LV lead not on due to diaphragmatic stimulation.  Suspect mixed septic/cardiogenic shock at this point with fever and suspected RLL  PNA.  MAP stable on norepinephrine 15.  Co-ox 49% this morning.  CVP 10 with ongoing worsening of renal function.    - Continue norepinephrine 15 (stable BP).  Will not add dobutamine with marked tachycardia and will not add milrinone with renal failure and low BP.  - Amiodarone gtt for rate control, continue 60 mg/hr.  - Not responding to IV diuretics and his wife again decided on full scope of treatment.  Will give him Lasix 160 mg IV bid today but suspect he will not respond.  If we press forwards, will need CVVH. - Hold midodrine while on NE.  3. VT: Patient has [employer] CRT-D device, LV lead off due to diaphragmatic pacing.  Patient has has runs of VT, treated w/ amiodarone gtt. VT quiescent.  - As above, transitioned to amiodarone gtt for rate control.  4. Digoxin toxicity: Severely elevated digoxin level initially, had digibind and HD.  - bradycardia improved 5. CAD: Last cath in 2016 with patent native coronaries, probably occluded SVG-RCA.  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 INR 4.6 today.  - Has been getting warfarin (hold with elevated INR), off heparin gtt (INR goal 2.5-3.5).  7. Atrial fibrillation: Chronic.  Rate 120s this  morning, suspect atrial flutter.  - Transitioned to amiodarone gtt for rate control, continue at 60 mg/hr.  8. Acute hypoxic respiratory failure: CHF/pulmonary edema, suspect HCAP with fever.  MRSE in sputum and Pseudomonas in urine.  - Attempt diuresis.  - vanco/cefepime for PNA.  - He is DNI.  9. Hypernatremia: Na 149, Continue free water.  10. ID: Fever to 103, HCAP with septic shock. Possible RLL PNA on CXR. MRSE in sputum and Pseudomonas in urine.  - Cefepime and vancomycin started.   Prognosis appears increasingly poor.  I suspect that he will not tolerate CVVH.  There have been extensive discussions with his wife and pastor, plan changed to DNR/focus comfort yesterday but reversed back to full scope of care but without intubation per discussion with wife overnight. Will need ongoing discussions with wife/family, continue to involve palliative care.   CRITICAL CARE Performed by: Loralie Champagne  Total critical care time: 40 minutes  Critical care time was exclusive of separately billable procedures and treating other patients.  Critical care was necessary to treat or prevent imminent or life-threatening deterioration.  Critical care was time spent personally by me on the following activities: development of treatment plan with patient and/or surrogate as well as nursing, discussions with consultants, evaluation of patient's response to treatment, examination of patient, obtaining history from patient or surrogate, ordering and performing treatments and interventions, ordering and review of laboratory studies, ordering and review of radiographic studies, pulse oximetry and re-evaluation of patient's condition.   Length of Stay: [place]  26-Sep-2019, 8:01 AM  Advanced Heart Failure Team Pager [phone] (M-F; 7a - 4p)  Please contact [facility] for night-coverage after hours (4p -7a ) and weekends on [email or web address]

## 2019-10-03 NOTE — Progress Notes (Signed)
Daily Progress Note   Patient Name: Lance Cook       Date: 09/26/19 DOB: Jul 01, 1961  Age: 59 y.o. MRN#: [id number] Attending Physician: Lance Farmer, MD Primary Care Physician: Lance Fuse., MD Admit Date: 08/15/2019  Reason for Consultation/Follow-up: Establishing goals of care, Psychosocial/spiritual support and Terminal Care  Spoke with ICU RN and CCM MD regarding patient.  Subjective:  Met with patient, wife and sisters at bedside.  Wife does not want to speak in front of patient as he can still hear. Dr. Nelda Cook had a frank conversation with Lance Cook this morning and explained that Lance Cook is dying and will likely pass today. He shifted patient to comfort measures.  Wife clearly understands he is dying but states I need him to come home with me.  God can still work a [employer].   Sisters at her side provide strong support.  Lance Cook reads bible promises of the afterlife to Lance Cook to calm her.    Lance Cook asked prognosis.  I gave her "minutes to days", but reinforced that clinically he looks as though he will pass today.  Questions answered to the best of my ability.  Patient's wife receiving good support from friends and family who are present with her.  Assessment: Patient actively dying.     Patient Profile/HPI:  59 y.o. 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  He was found to be severely bradycardic with hyperkalemia and acute on chronic renal failure.  He developed cardiogenic shock and required urgent intubation and continuous renal replacement therapy.  He has since been able to  wean from the vent.  He is still considered volume overloaded.  He is no longer on CRRT.  Diuresis is being attempted with IV diuretic drip.  The patient remains critically weak and deconditioned.  He is having great difficulty handling his secretions.  Core track is in place for nutrition.  He is felt to have cardiorenal syndrome.  If IV diuretic diuresis is not productive continue renal replacement therapy may be reinitiated.     Length of Stay: 18  Current Medications: Scheduled Meds:   sodium chloride   Intravenous Once  sodium chloride   Intravenous Once   chlorhexidine  15 mL Mouth Rinse BID   Chlorhexidine Gluconate Cloth  6 each Topical Q0600   glycopyrrolate  0.4 mg Intravenous TID   hydroxypropyl methylcellulose / hypromellose  1 drop Both Eyes TID   LORazepam  0.5 mg Intravenous TID   mouth rinse  15 mL Mouth Rinse q12n4p   sodium chloride flush  3 mL Intravenous Q12H    Continuous Infusions:  sodium chloride Stopped (09/01/19 0922)   sodium chloride     morphine 10 mg/hr (2019-09-11 1142)    PRN Meds: sodium chloride, sodium chloride, acetaminophen, albuterol, antiseptic oral rinse, bisacodyl, glycopyrrolate **OR** glycopyrrolate **OR** glycopyrrolate, guaiFENesin, haloperidol **OR** haloperidol **OR** haloperidol lactate, lip balm, LORazepam **OR** LORazepam **OR** LORazepam, morphine, ondansetron (ZOFRAN) IV, ondansetron **OR** ondansetron (ZOFRAN) IV, polyvinyl alcohol, sennosides, sodium chloride flush  Physical Exam        Chronically ill appearing male who is actively dying. CV tachycardic Respirations somewhat labored and wet Abdomen thin, soft, nd  Vital Signs: BP (!) 78/50    Pulse (!) 106    Temp 99 F (37.2 C) (Axillary)    Resp (!) 31    Ht 5' 2.21" (1.58 m)    Wt 71.8 kg    SpO2 100%    BMI 28.76 kg/m  SpO2: SpO2: 100 % O2 Device: O2 Device: Nasal Cannula O2 Flow Rate: O2 Flow Rate (L/min): 4 L/min  Intake/output summary:    Intake/Output Summary (Last 24 hours) at 09/11/2019 1217 Last data filed at Sep 11, 2019 1200 Gross per 24 hour  Intake 1276.48 ml  Output 825 ml  Net 451.48 ml   LBM: Last BM Date: 11-Sep-2019 Baseline Weight: Weight: 80.3 kg Most recent weight: Weight: 71.8 kg       Palliative Assessment/Data: 10%      Patient Active Problem List   Diagnosis Date Noted   Cardiorenal syndrome with renal failure    DNR (do not resuscitate)    Palliative care encounter    Palliative care by specialist    Acute combined systolic and diastolic heart failure (HCC)    AKI (acute kidney injury) ([place])    Respiration abnormal    Protein-calorie malnutrition, severe 08/23/2019   Cardiogenic shock ([place])    Central venous catheter in place    Hyperkalemia 08/02/2019   Bradycardia    Acute on chronic systolic CHF (congestive heart failure), NYHA class 4 ([place]) 06/21/2019   Acute on chronic diastolic (congestive) heart failure ([place]) 04/01/2019   Acute on chronic congestive heart failure ([place])    Anasarca    Pressure injury of skin 02/14/2019   Acute on chronic heart failure ([place]) 02/13/2019   Benign neoplasm of ascending colon    Benign neoplasm of rectum    Benign neoplasm of rectosigmoid junction    Benign neoplasm of sigmoid colon    DM type 2 with diabetic dyslipidemia ([place]) 04/26/2018   Lower extremity edema 03/08/2018   Other fatigue 01/28/2018   Chronic anticoagulation 01/05/2018   Insomnia 11/23/2017   Unilateral vocal cord paralysis 11/30/2016   Leg swelling 11/28/2016   S/P MVR (mitral valve replacement)    Acute on chronic diastolic CHF (congestive heart failure) ([place]) 08/27/2016   Chronic pain syndrome 07/10/2016   Chronic diarrhea    Benign neoplasm of transverse colon    Scrotal swelling 05/30/2016   Musculoskeletal pain 01/07/2016   Personal history of malignant neoplasm of testis 07/12/2015   Long term current use of opiate  analgesic  07/11/2015   Long term prescription opiate use 07/11/2015   Opiate use (22.5 MME/day) 07/11/2015   Opiate dependence ([place]) 07/11/2015   Goals of care, counseling/discussion 07/11/2015   Chronic low back pain (midline) 07/11/2015   Lumbar facet syndrome 07/11/2015   Chronic lower extremity pain (Left) 07/11/2015   Adult BMI 30+ 06/19/2015   Obesity (BMI 30-39.9) 06/19/2015   Hypomagnesemia 02/08/2015   Physical deconditioning 02/08/2015   Acute encephalopathy 02/05/2015   Enteritis due to Clostridium difficile 01/30/2015   Pneumonia due to infectious organism 01/29/2015   Acute respiratory failure (HCC)    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 02/29/1600   Chronic diastolic congestive heart failure ([place])    Type 2 diabetes mellitus with other specified complication ([place]) 09/32/3557   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    Venous insufficiency of both lower extremities 04/02/2014   Morbid obesity ([place]) 04/02/2014   Diabetes mellitus ([place]) 03/14/2014   Personal history of transient ischemic attack (TIA), and cerebral infarction without residual deficits 02/23/2014   Malignant neoplasm of testis ([place]) 02/23/2014   H/O TIA (transient ischemic attack) and stroke 02/23/2014   HTN (hypertension) 02/23/2014   Lymphadenopathy 02/23/2014   S/P AVR (aortic valve replacement) 02/23/2014   Encounter for therapeutic drug monitoring 02/02/2014   Long term current use of anticoagulant therapy 07/15/2012   Hypogonadism male 04/08/2012   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   Testicular hypofunction 04/08/2012   Male hypogonadism 04/08/2012   Other testicular hypofunction 04/08/2012   History of colonic polyps 12/23/2011   Testicular cancer ([place])    TIA (transient ischemic attack)    OSA (obstructive sleep apnea)    Lumbar spinal stenosis (Severe L4-5) 10/08/2011   ED (erectile dysfunction) of  organic origin 10/08/2011   Lumbar canal stenosis 10/08/2011   Low back pain 10/08/2011   Spinal stenosis of lumbar region without neurogenic claudication 10/08/2011   CAD (coronary artery disease)    Spondylosis 04/29/2010   DYSLIPIDEMIA 01/15/2010   Chronic venous hypertension with ulcer ([place]) 01/15/2010   Essential hypertension 12/14/2009   Gout 11/01/2009   Anemia, iron deficiency 11/01/2009   MYOCARDIAL INFARCTION, HX OF 11/01/2009   Atrial fibrillation, chronic 11/01/2009   PERIPHERAL EDEMA 11/01/2009   Heart valve replaced by other means 11/01/2009   Presence of other heart-valve replacement 11/01/2009   Ascending aortic dissection ([place]) 07/14/2008   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    Range of morphine increased as patient is still tachycardic with labored breathing and appears uncomfortable.  Ativan added.  PMT will remain available for support.  Goals of Care and Additional Recommendations:  Limitations on Scope of Treatment: Full Comfort Care  Code Status:  DNR  Prognosis:   Likely minutes to hours.   Discharge Planning:  Anticipated Hospital Death  Care plan was discussed with CCM MD, ICU RN, Wife and family.  Thank you for allowing the Palliative Medicine Team to assist in the care of this patient.  Total time spent:  35 min.     Greater than 50%  of this time was spent counseling and coordinating care related to the above assessment and plan.  Florentina Jenny, PA-C Palliative Medicine  Please contact Palliative MedicineTeam phone at [phone] for questions and concerns between 7 am - 7 pm.  Please see AMION for individual provider pager numbers.

## 2019-10-03 NOTE — Progress Notes (Signed)
The chaplain visited with the family and the patient as the patient is nearing end of life.  The wife expressed the hope that the patient would recover.  The chaplain supported by listening and having a theological conversation and by praying with the patient.  The chaplain will follow-up if needed.  Brion Aliment Chaplain Resident For questions concerning this note please contact me by pager [phone]

## 2019-10-03 NOTE — Progress Notes (Signed)
Patient's wife Lance Cook called for an update about her husband Lance Cook. [name] stated that she is not ready to lose him yet and she wants everything done to keep him alive and wants him back to Full code. RN confirmed with her if that's what she wants and she confirmed that is what she wants. She stated that she will not be happy if he coded and we did not do anything to save him. RN attempted to call wife's sisters since they are also involved with Lance Cook decision  of care, but there was no answer. RN notified E-link and Camera operator. Will continue to monitor throughout shift.

## 2019-10-03 NOTE — Progress Notes (Signed)
eLink Physician-Brief Progress Note Patient Name: Lance Cook DOB: 1960-09-29 MRN: [id number]   Date of Service  2019-09-06  HPI/Events of Note  Code status change from DNR back to Full Code at the request of the patient's wife.  eICU Interventions  See RN note for additional details regarding the discussion between RN and wife about code status.  Full code order entered.     Intervention Category Minor Interventions: Other:  [name] 2019/09/06, 12:49 AM

## 2019-10-03 DEATH — deceased

## 2021-05-22 IMAGING — CR CHEST - 2 VIEW
2 series · 2 of 2 positions shown · non-contrast
Comparison: February 13, 2019

CLINICAL DATA: Shortness of breath

EXAM:
CHEST - 2 VIEW

[chest lat]
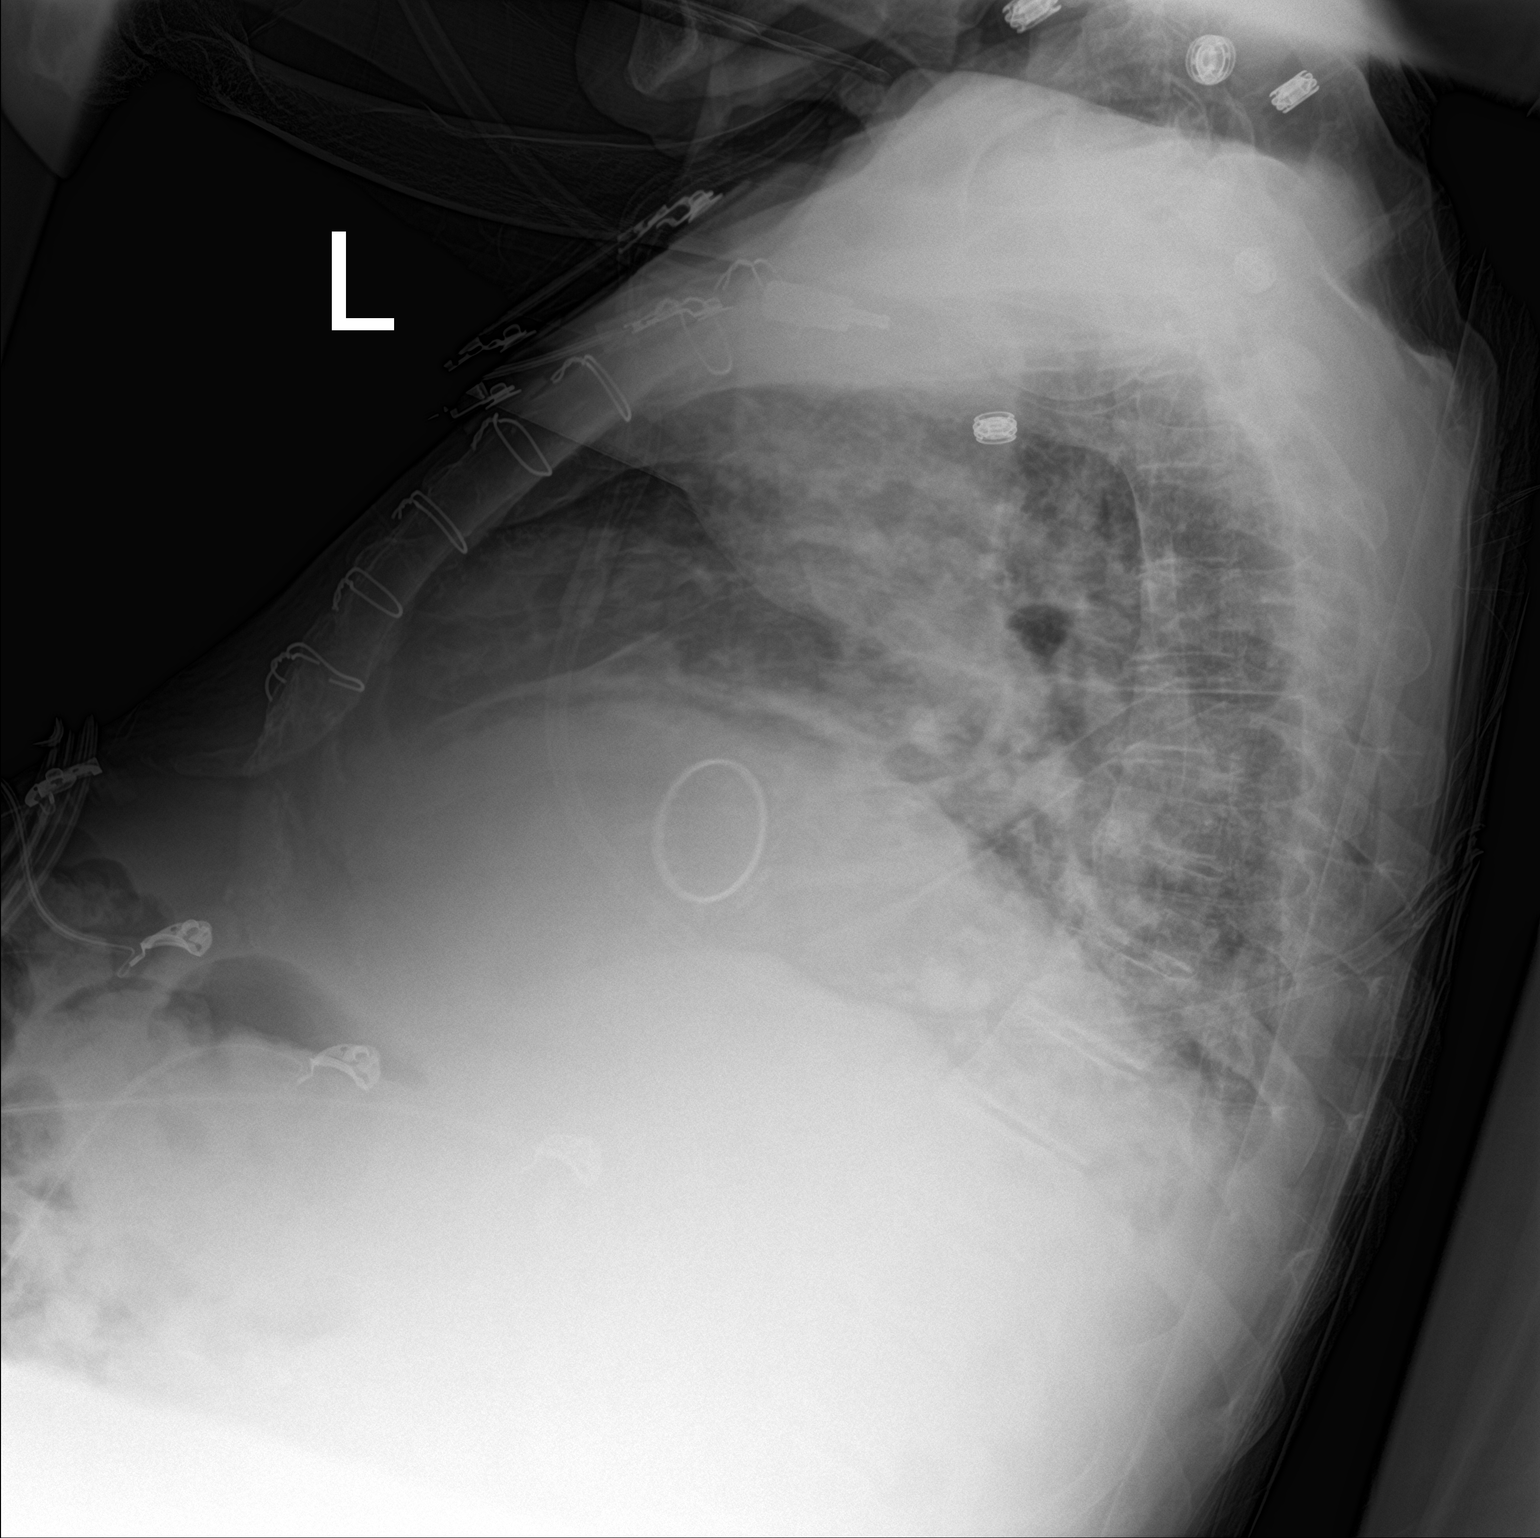

[chest ap]
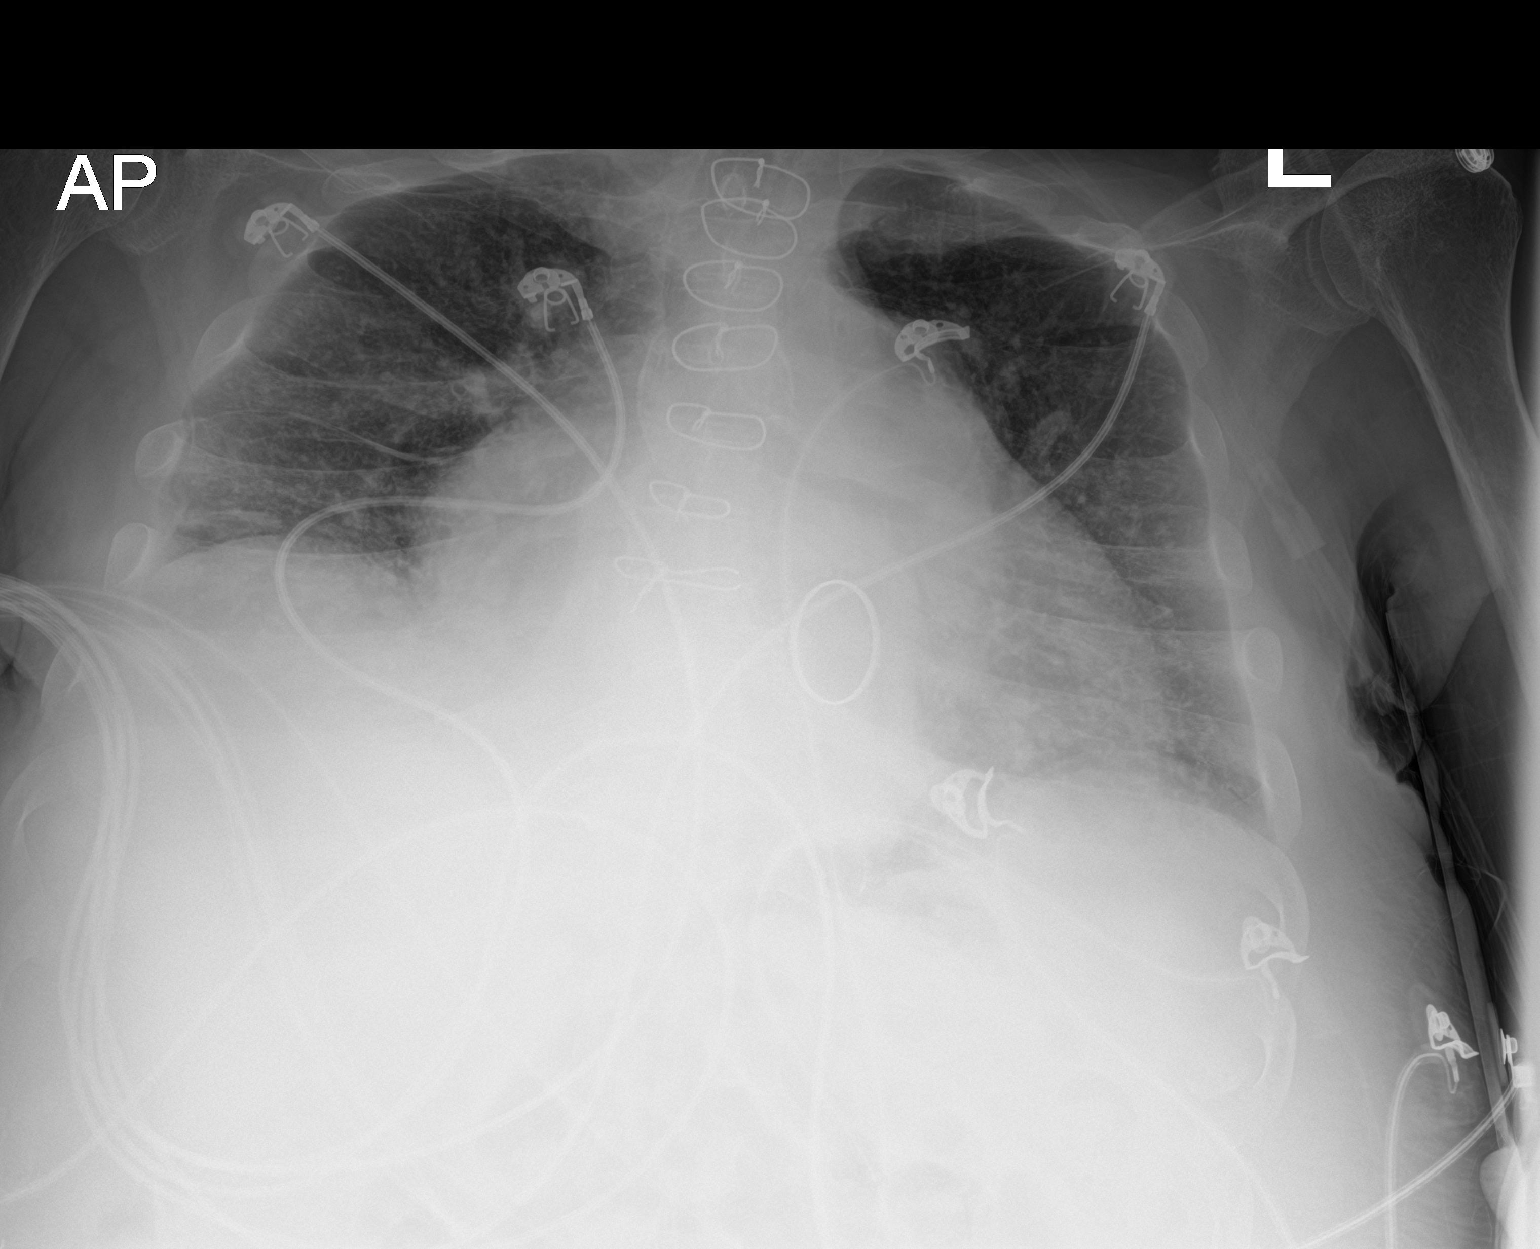

[2 of 2 positions shown; findings below may reference images not displayed]

FINDINGS: There is a nodular opacity in the right lower lobe measuring 1.4 x
1.0 cm. There is no edema or consolidation. There is cardiomegaly.
There is prominence of the central pulmonary arteries with fairly
rapid peripheral tapering, findings felt to represent pulmonary
arterial hypertension. Patient is status post mitral valve
replacement. No adenopathy. No bone lesions.
IMPRESSION: 1.4 x 1.0 cm right lower lobe nodular opacity. This finding warrants
correlation with noncontrast enhanced chest CT to further evaluate.

No edema or consolidation. There is stable cardiomegaly with evident
pulmonary arterial hypertension. No adenopathy evident. Patient is
status post mitral valve replacement.

## 2021-05-26 IMAGING — DX PORTABLE CHEST - 1 VIEW
1 series · 1 of 1 positions shown · non-contrast
Comparison: April 05, 2019

CLINICAL DATA: PICC line placement

EXAM:
PORTABLE CHEST 1 VIEW

[chest ap]
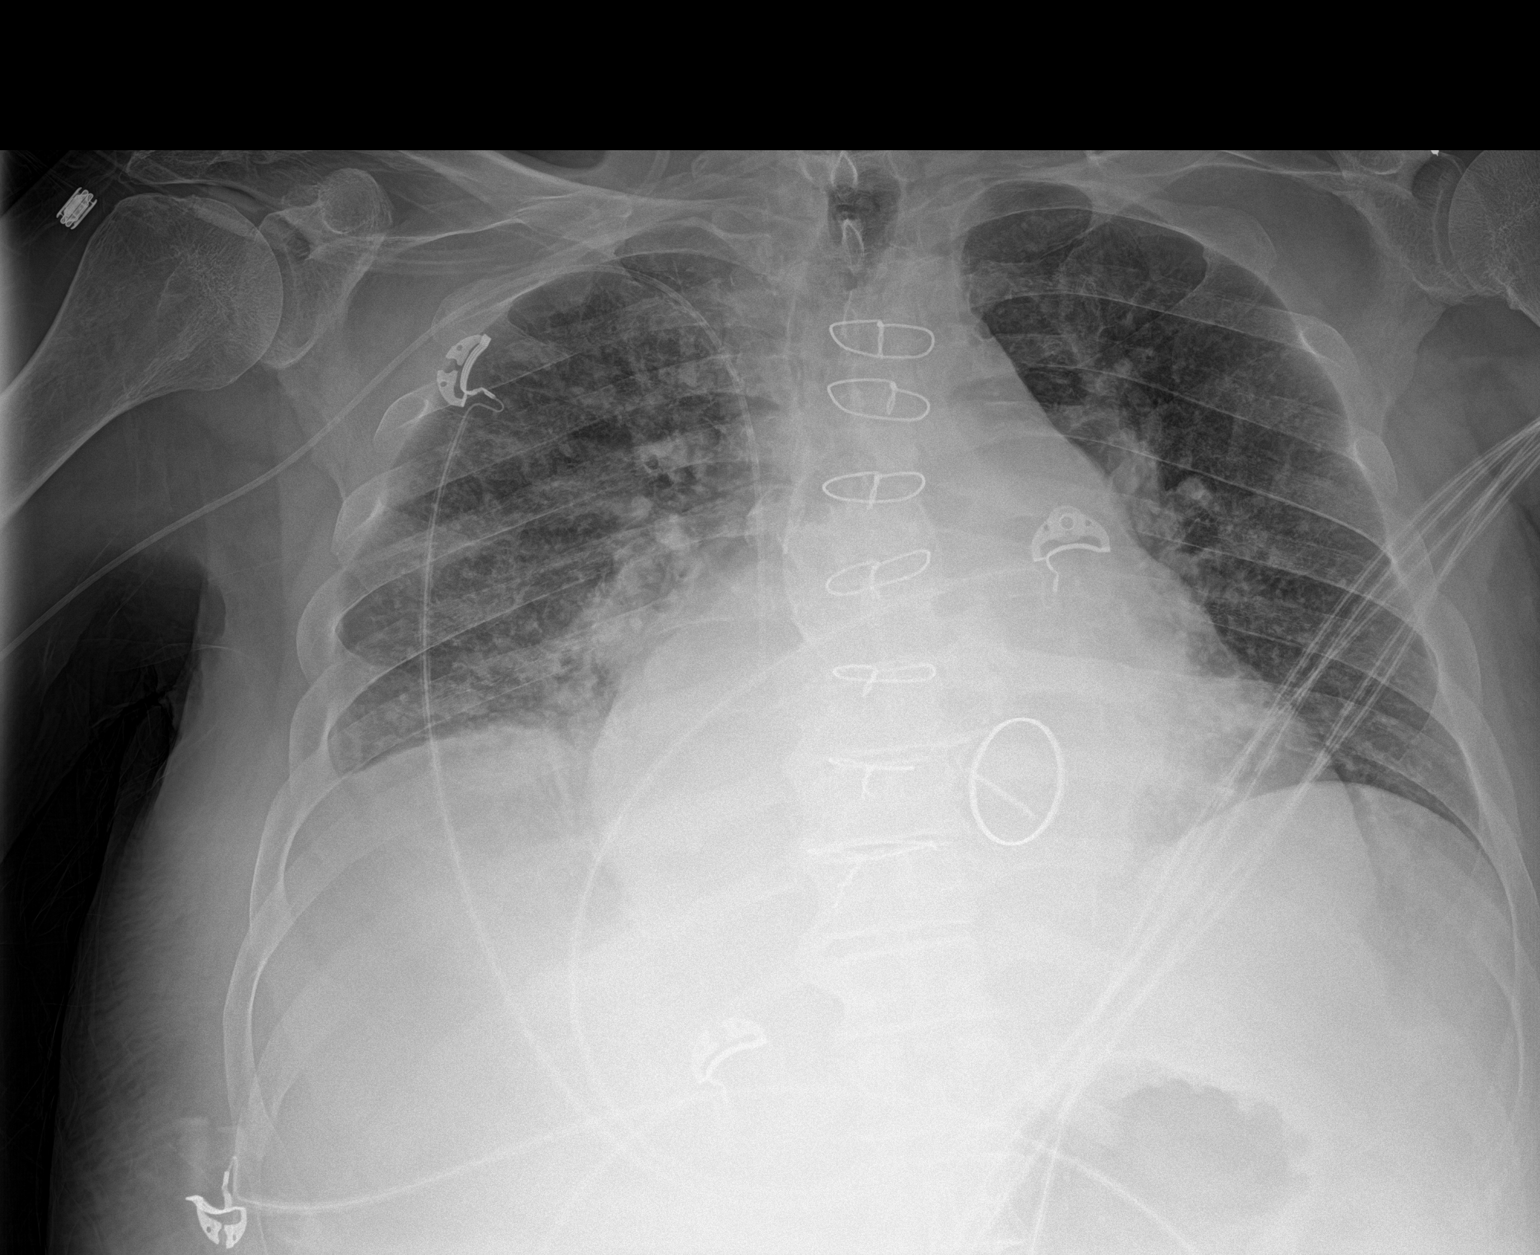

[1 of 1 positions shown; findings below may reference images not displayed]

FINDINGS: The heart size remains enlarged. The patient is status post prior
median sternotomy and valve replacement. There is a small
right-sided pleural effusion with elevation of the right
hemidiaphragm. The right-sided PICC line is well positioned with the
tip terminating near the cavoatrial junction. There is persistent
vascular congestion with pulmonary edema. There is likely
compressive atelectasis at the right lung base.
IMPRESSION: 1. Well-positioned right-sided PICC line as above.
2. Otherwise, stable appearance of the chest.

## 2021-08-12 IMAGING — NM NM TUMOR LOCAL/TRACER DISTR WITH SPECT
3 series · 18 of 18 positions shown · non-contrast
Comparison: none

CLINICAL DATA: HEART FAILURE. CONCERN FOR CARDIAC AMYLOIDOSIS.

EXAM:
NUCLEAR MEDICINE TUMOR LOCALIZATION. PYP CARDIAC AMYLOIDOSIS SCAN
WITH SPECT
TECHNIQUE: Following intravenous administration of radiopharmaceutical,
anterior planar images of the chest were obtained. Regions of
interest were placed on the heart and contralateral chest wall for
quantitative assessment. Additional SPECT imaging of the chest was
obtained.
RADIOPHARMACEUTICALS:  20.0 mCi TECHNETIUM 99 PYROPHOSPHATE

[Series 1: amyloid_(id)_cor · 4.1mm · 4.14mm/px · 6 of 128 frames shown]
[frame 11/128]
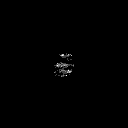
[frame 32/128]
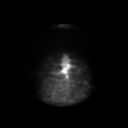
[frame 54/128]
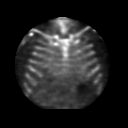
[frame 75/128]
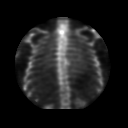
[frame 96/128]
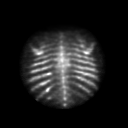
[frame 118/128]
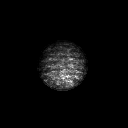

[Series 1: amyloid_(id)_tra · 4.1mm · 4.14mm/px · 6 of 128 frames shown]
[frame 11/128]
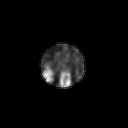
[frame 32/128]
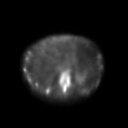
[frame 54/128]
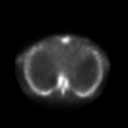
[frame 75/128]
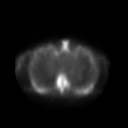
[frame 96/128]
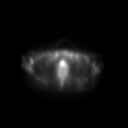
[frame 118/128]
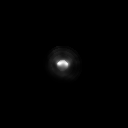

[Series 2: amyloid · 4.14mm/px · 6 of 64 frames shown]
[frame 6/64]
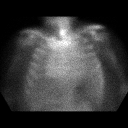
[frame 16/64]
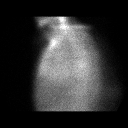
[frame 27/64]
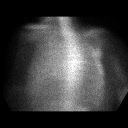
[frame 38/64]
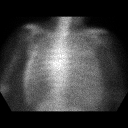
[frame 48/64]
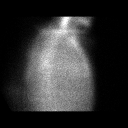
[frame 59/64]
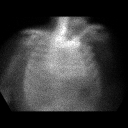

[18 of 18 positions shown; findings below may reference images not displayed]

FINDINGS: Planar Visual assessment:

Anterior planar imaging demonstrates radiotracer uptake within the
heart less than uptake within the adjacent ribs (Grade 0).

Quantitative assessment :

Quantitative assessment of the cardiac uptake compared to the
contralateral chest wall is equal to (H/CL = 1.1).

SPECT assessment: SPECT imaging of the chest demonstrates no
radiotracer accumulation within the LEFT ventricle.
IMPRESSION: Visual and quantitative assessment (grade 0, H/CLL equal 1.1) are
equivocal for transthyretin amyloidosis.
